# Patient Record
Sex: Male | Born: 1960 | Race: White | Hispanic: No | Marital: Single | Smoking: Current some day smoker
Health system: Southern US, Community
[De-identification: ages and names within clinical notes are randomized; demographics above are authoritative.]

## PROBLEM LIST (undated history)

## (undated) DIAGNOSIS — F419 Anxiety disorder, unspecified: Secondary | ICD-10-CM

## (undated) DIAGNOSIS — M4800 Spinal stenosis, site unspecified: Secondary | ICD-10-CM

## (undated) DIAGNOSIS — M869 Osteomyelitis, unspecified: Secondary | ICD-10-CM

## (undated) DIAGNOSIS — E119 Type 2 diabetes mellitus without complications: Secondary | ICD-10-CM

## (undated) DIAGNOSIS — K589 Irritable bowel syndrome without diarrhea: Secondary | ICD-10-CM

## (undated) DIAGNOSIS — N186 End stage renal disease: Secondary | ICD-10-CM

## (undated) DIAGNOSIS — B1920 Unspecified viral hepatitis C without hepatic coma: Secondary | ICD-10-CM

## (undated) DIAGNOSIS — E785 Hyperlipidemia, unspecified: Secondary | ICD-10-CM

## (undated) DIAGNOSIS — F32A Depression, unspecified: Secondary | ICD-10-CM

## (undated) DIAGNOSIS — F329 Major depressive disorder, single episode, unspecified: Secondary | ICD-10-CM

## (undated) HISTORY — PX: SPINAL FUSION: SHX223

## (undated) HISTORY — PX: BACK SURGERY: SHX140

## (undated) HISTORY — PX: OTHER SURGICAL HISTORY: SHX169

---
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
## 2017-06-18 DIAGNOSIS — E1165 Type 2 diabetes mellitus with hyperglycemia: Secondary | ICD-10-CM

## 2017-06-27 DIAGNOSIS — L97519 Non-pressure chronic ulcer of other part of right foot with unspecified severity: Secondary | ICD-10-CM | POA: Insufficient documentation

## 2017-06-27 DIAGNOSIS — E08621 Diabetes mellitus due to underlying condition with foot ulcer: Secondary | ICD-10-CM | POA: Insufficient documentation

## 2017-06-27 DIAGNOSIS — F17200 Nicotine dependence, unspecified, uncomplicated: Secondary | ICD-10-CM | POA: Insufficient documentation

## 2017-06-27 DIAGNOSIS — L03116 Cellulitis of left lower limb: Secondary | ICD-10-CM | POA: Insufficient documentation

## 2017-06-29 DIAGNOSIS — M86172 Other acute osteomyelitis, left ankle and foot: Secondary | ICD-10-CM | POA: Insufficient documentation

## 2017-08-15 DIAGNOSIS — B9561 Methicillin susceptible Staphylococcus aureus infection as the cause of diseases classified elsewhere: Secondary | ICD-10-CM | POA: Diagnosis present

## 2017-08-15 DIAGNOSIS — R7881 Bacteremia: Secondary | ICD-10-CM | POA: Diagnosis present

## 2017-08-17 DIAGNOSIS — A4102 Sepsis due to Methicillin resistant Staphylococcus aureus: Secondary | ICD-10-CM | POA: Insufficient documentation

## 2018-01-23 DIAGNOSIS — M25562 Pain in left knee: Secondary | ICD-10-CM | POA: Insufficient documentation

## 2018-03-24 DIAGNOSIS — R109 Unspecified abdominal pain: Secondary | ICD-10-CM | POA: Insufficient documentation

## 2018-10-17 DIAGNOSIS — R269 Unspecified abnormalities of gait and mobility: Secondary | ICD-10-CM | POA: Insufficient documentation

## 2018-10-17 DIAGNOSIS — Z89511 Acquired absence of right leg below knee: Secondary | ICD-10-CM | POA: Insufficient documentation

## 2018-11-07 DIAGNOSIS — E785 Hyperlipidemia, unspecified: Secondary | ICD-10-CM | POA: Insufficient documentation

## 2018-11-07 DIAGNOSIS — Z765 Malingerer [conscious simulation]: Secondary | ICD-10-CM | POA: Insufficient documentation

## 2019-01-23 HISTORY — PX: THORACIC SPINE SURGERY: SHX802

## 2019-01-27 ENCOUNTER — Other Ambulatory Visit: Admit: 2019-01-27 | Payer: Medicaid Other

## 2019-01-28 ENCOUNTER — Other Ambulatory Visit (INDEPENDENT_AMBULATORY_CARE_PROVIDER_SITE_OTHER): Payer: Self-pay | Admitting: Nurse Practitioner

## 2019-01-28 ENCOUNTER — Telehealth (INDEPENDENT_AMBULATORY_CARE_PROVIDER_SITE_OTHER): Payer: Self-pay

## 2019-01-28 MED ORDER — CEFAZOLIN SODIUM-DEXTROSE 2-4 GM/100ML-% IV SOLN: 2.0000 g | Freq: Once | INTRAVENOUS | Status: DC

## 2019-01-28 NOTE — Telephone Encounter (Signed)
Patient was scheduled for Thursday 01/29/2019 with a 12:45 pm arrival time for Dr. Wyn Quaker for a Hickman cath placement per Dr. Wyn Quaker and [facility]. Rodney Booze at [employer] was given the instructions regarding the patient.

## 2019-01-30 ENCOUNTER — Ambulatory Visit: Admission: RE | Admit: 2019-01-30 | Payer: Medicaid Other | Source: Ambulatory Visit

## 2019-01-30 ENCOUNTER — Other Ambulatory Visit (INDEPENDENT_AMBULATORY_CARE_PROVIDER_SITE_OTHER): Payer: Self-pay | Admitting: Nurse Practitioner

## 2019-02-02 ENCOUNTER — Ambulatory Visit
Admission: RE | Admit: 2019-02-02 | Discharge: 2019-02-02 | Disposition: A | Discharge status: Skilled Nursing Facility | Payer: Medicaid Other | Attending: Vascular Surgery | Admitting: Vascular Surgery

## 2019-02-02 ENCOUNTER — Encounter: Payer: Self-pay | Admitting: *Deleted

## 2019-02-02 ENCOUNTER — Encounter
Admission: RE | Disposition: A | Discharge status: Skilled Nursing Facility | Payer: Self-pay | Source: Home / Self Care | Attending: Vascular Surgery

## 2019-02-02 ENCOUNTER — Other Ambulatory Visit: Payer: Self-pay

## 2019-02-02 ENCOUNTER — Inpatient Hospital Stay: Admission: RE | Admit: 2019-02-02 | Payer: Medicaid Other | Source: Ambulatory Visit

## 2019-02-02 DIAGNOSIS — Z792 Long term (current) use of antibiotics: Secondary | ICD-10-CM | POA: Diagnosis not present

## 2019-02-02 DIAGNOSIS — E1169 Type 2 diabetes mellitus with other specified complication: Secondary | ICD-10-CM | POA: Diagnosis not present

## 2019-02-02 DIAGNOSIS — Z1159 Encounter for screening for other viral diseases: Secondary | ICD-10-CM | POA: Insufficient documentation

## 2019-02-02 DIAGNOSIS — M869 Osteomyelitis, unspecified: Secondary | ICD-10-CM

## 2019-02-02 DIAGNOSIS — E119 Type 2 diabetes mellitus without complications: Secondary | ICD-10-CM | POA: Diagnosis not present

## 2019-02-02 DIAGNOSIS — F17210 Nicotine dependence, cigarettes, uncomplicated: Secondary | ICD-10-CM | POA: Diagnosis not present

## 2019-02-02 HISTORY — DX: Type 2 diabetes mellitus without complications: E11.9

## 2019-02-02 HISTORY — PX: CENTRAL LINE INSERTION-TUNNELED: CATH118291

## 2019-02-02 HISTORY — DX: Osteomyelitis, unspecified: M86.9

## 2019-02-02 LAB — SARS CORONAVIRUS 2 BY RT PCR (HOSPITAL ORDER, PERFORMED IN ~~LOC~~ HOSPITAL LAB): SARS Coronavirus 2: NEGATIVE

## 2019-02-02 LAB — GLUCOSE, CAPILLARY
Glucose-Capillary: 172 mg/dL — ABNORMAL HIGH (ref 70–99)
Glucose-Capillary: 185 mg/dL — ABNORMAL HIGH (ref 70–99)

## 2019-02-02 SURGERY — CENTRAL LINE INSERTION-TUNNELED
Anesthesia: Moderate Sedation

## 2019-02-02 MED ORDER — HYDROMORPHONE HCL 1 MG/ML IJ SOLN: 1.0000 mg | Freq: Once | INTRAMUSCULAR | Status: DC | PRN

## 2019-02-02 MED ORDER — MIDAZOLAM HCL 2 MG/ML PO SYRP: 8.0000 mg | ORAL_SOLUTION | Freq: Once | ORAL | Status: DC | PRN

## 2019-02-02 MED ORDER — FENTANYL CITRATE (PF) 100 MCG/2ML IJ SOLN
INTRAMUSCULAR | Status: DC | PRN
  Administered 2019-02-02 (×2): 50 ug via INTRAVENOUS

## 2019-02-02 MED ORDER — FENTANYL CITRATE (PF) 100 MCG/2ML IJ SOLN
INTRAMUSCULAR | Status: AC
  Filled 2019-02-02: qty 2

## 2019-02-02 MED ORDER — MIDAZOLAM HCL 2 MG/2ML IJ SOLN
INTRAMUSCULAR | Status: AC
  Filled 2019-02-02: qty 2

## 2019-02-02 MED ORDER — LIDOCAINE-EPINEPHRINE (PF) 1 %-1:200000 IJ SOLN
INTRAMUSCULAR | Status: AC
  Filled 2019-02-02: qty 30

## 2019-02-02 MED ORDER — ONDANSETRON HCL 4 MG/2ML IJ SOLN: 4.0000 mg | Freq: Four times a day (QID) | INTRAMUSCULAR | Status: DC | PRN

## 2019-02-02 MED ORDER — FAMOTIDINE 20 MG PO TABS: 40.0000 mg | ORAL_TABLET | Freq: Once | ORAL | Status: DC | PRN

## 2019-02-02 MED ORDER — SODIUM CHLORIDE 0.9 % IV SOLN
INTRAVENOUS | Status: DC
  Administered 2019-02-02: 12:00:00 via INTRAVENOUS

## 2019-02-02 MED ORDER — MIDAZOLAM HCL 2 MG/2ML IJ SOLN
INTRAMUSCULAR | Status: DC | PRN
  Administered 2019-02-02: 2 mg via INTRAVENOUS

## 2019-02-02 MED ORDER — METHYLPREDNISOLONE SODIUM SUCC 125 MG IJ SOLR: 125.0000 mg | Freq: Once | INTRAMUSCULAR | Status: DC | PRN

## 2019-02-02 MED ORDER — CEFAZOLIN SODIUM-DEXTROSE 2-4 GM/100ML-% IV SOLN
INTRAVENOUS | Status: AC
  Administered 2019-02-02: 2 g
  Filled 2019-02-02: qty 100

## 2019-02-02 MED ORDER — DIPHENHYDRAMINE HCL 50 MG/ML IJ SOLN: 50.0000 mg | Freq: Once | INTRAMUSCULAR | Status: DC | PRN

## 2019-02-02 SURGICAL SUPPLY — 6 items
DERMABOND ADVANCED (GAUZE/BANDAGES/DRESSINGS) ×2
DERMABOND ADVANCED .7 DNX12 (GAUZE/BANDAGES/DRESSINGS) ×1 IMPLANT
KIT SINGLE LUMEN POWERLINE 5FR (CATHETERS) ×3 IMPLANT
PACK ANGIOGRAPHY (CUSTOM PROCEDURE TRAY) ×3 IMPLANT
SUT MON AB 4-0 PC3 18 (SUTURE) ×3 IMPLANT
SUT PROLENE 0 CT 1 30 (SUTURE) ×3 IMPLANT

## 2019-02-02 NOTE — H&P (Signed)
[facility] Admission History & Physical  MRN : [id number]  Eugene [name] is a 58 y.o. (Aug 12, 1961) male who presents with chief complaint of need for Hickman Placement.  History of Present Illness:  The patient is a 58 year old male with a past medical history of diabetes, osteomyelitis, current everyday tobacco abuse who resides in a nursing home.  The patient is a poor historian and information for this admission note was taken from nursing home paperwork and previous EPIC notation.  Patient had a PICC line placed for long-term antibiotic therapy.  Unfortunately, the patient would remove his PICC line and a more permanent access was needed.  Patient denies any fever, nausea vomiting.  Patient denies any chest pain or shortness of breath.    Patient was referred to our practice and presents today to have a Hickman catheter placed so he may resume his long-term antibiotics. Current Facility-Administered Medications  Medication Dose Route Frequency Provider Last Rate Last Dose  . 0.9 %  sodium chloride infusion   Intravenous Continuous Georgiana Spinner, NP 75 mL/hr at 02/02/19 1159    . ceFAZolin (ANCEF) IVPB 2g/100 mL premix  2 g Intravenous Once Sheppard Plumber E, NP      . diphenhydrAMINE (BENADRYL) injection 50 mg  50 mg Intravenous Once PRN Georgiana Spinner, NP      . famotidine (PEPCID) tablet 40 mg  40 mg Oral Once PRN Georgiana Spinner, NP      . fentaNYL (SUBLIMAZE) 100 MCG/2ML injection           . HYDROmorphone (DILAUDID) injection 1 mg  1 mg Intravenous Once PRN Georgiana Spinner, NP      . lidocaine-EPINEPHrine (XYLOCAINE-EPINEPHrine) 1 %-1:200000 (PF) injection           . methylPREDNISolone sodium succinate (SOLU-MEDROL) 125 mg/2 mL injection 125 mg  125 mg Intravenous Once PRN Georgiana Spinner, NP      . midazolam (VERSED) 2 MG/2ML injection           . midazolam (VERSED) 2 MG/ML syrup 8 mg  8 mg Oral Once PRN Georgiana Spinner, NP      . ondansetron [facility])  injection 4 mg  4 mg Intravenous Q6H PRN Georgiana Spinner, NP       Past Medical History:  Diagnosis Date  . Diabetes mellitus without complication (HCC)   . Osteomyelitis [facility])    Past Surgical History:  Procedure Laterality Date  . bilateral amputation Bilateral    Social History Social History   Tobacco Use  . Smoking status: Current Every Day Smoker    Packs/day: 0.25    Types: Cigarettes  . Smokeless tobacco: Never Used  Substance Use Topics  . Alcohol use: Not Currently  . Drug use: Not on file   Family History History reviewed. No pertinent family history.  Unable to obtain as the patient is a poor historian  No Known Allergies  REVIEW OF SYSTEMS (Negative unless checked)  Constitutional: Weight loss  Fever  Chills Cardiac: Chest pain   Chest pressure   Palpitations   Shortness of breath when laying flat   Shortness of breath at rest   Shortness of breath with exertion. Vascular:  Pain in legs with walking   Pain in legs at rest   Pain in legs when laying flat   Claudication   Pain in feet when walking  Pain in feet at rest  Pain in feet when laying flat     History of DVT   [] Phlebitis   [] Swelling in legs   [] Varicose veins   [] Non-healing ulcers Pulmonary:   [] Uses home oxygen   [] Productive cough   [] Hemoptysis   [] Wheeze  [] COPD   [] Asthma Neurologic:  [] Dizziness  [] Blackouts   [] Seizures   [] History of stroke   [] History of TIA  [] Aphasia   [] Temporary blindness   [] Dysphagia   [] Weakness or numbness in arms   [] Weakness or numbness in legs Musculoskeletal:  [] Arthritis   [] Joint swelling   [] Joint pain   [] Low back pain Hematologic:  [] Easy bruising  [] Easy bleeding   [] Hypercoagulable state   [] Anemic  [] Hepatitis Gastrointestinal:  [] Blood in stool   [] Vomiting blood  [] Gastroesophageal reflux/heartburn   [] Difficulty swallowing. Genitourinary:  [] Chronic kidney disease   [] Difficult urination  [] Frequent urination  [] Burning with  urination   [] Blood in urine Skin:  [] Rashes   [] Ulcers   [] Wounds Psychological:  [] History of anxiety   []  History of major depression.  Unable to obtain as the patient is a poor historian  Physical Examination  Vitals:   02/02/19 1339 02/02/19 1345 02/02/19 1350 02/02/19 1400  BP:    (!) 95/54  Pulse:    80  Resp:    12  Temp:      TempSrc:      SpO2: 99% 99% 98% 93%  Weight:      Height:       Body mass index is 31.87 kg/m. Gen: WD/WN, NAD Head: Geiger/AT, No temporalis wasting. Prominent temp pulse not noted. Ear/Nose/Throat: Hearing grossly intact, nares w/o erythema or drainage, oropharynx w/o Erythema/Exudate,  Eyes: Conjunctiva clear, sclera non-icteric Neck: Trachea midline.  No JVD.  Pulmonary:  Good air movement, respirations not labored, no use of accessory muscles.  Cardiac: RRR, normal S1, S2. Gastrointestinal: soft, non-tender/non-distended. No guarding/reflex.  Musculoskeletal: M/S 5/5 throughout.  Extremities without ischemic changes.  Neurologic: Sensation grossly intact in extremities.  Symmetrical.  Speech is fluent. Motor exam as listed above. Psychiatric: Judgment intact, Mood & affect appropriate for pt's clinical situation. Dermatologic: No rashes or ulcers noted.  No cellulitis or open wounds. Lymph : No Cervical, Axillary, or Inguinal lymphadenopathy.  CBC No results found for: WBC, HGB, HCT, MCV, PLT  BMET No results found for: NA, K, CL, CO2, GLUCOSE, BUN, CREATININE, CALCIUM, GFRNONAA, GFRAA CrCl cannot be calculated (No successful lab value found.).  COAG No results found for: INR, PROTIME  Radiology No results found.  Assessment/Plan The patient is a 58 year old male with a past medical history of diabetes, osteomyelitis, current everyday tobacco abuse who resides in a nursing home. 1. Need for long term ABX: The patient had a PICC line placed however has been removing them.  The patient needs to be on long-term antibiotics and he was sent  to our practice to have a Hickman placed in order to be able to receive those antibiotics long-term.  Procedure, risks and benefits explained to the patient.  All questions answered.  The patient wishes to proceed. 2. Tobacco Abuse: We had a discussion for approximately three minutes regarding the absolute need for smoking cessation due to the deleterious nature of tobacco on the vascular system. We discussed the tobacco use would diminish patency of any intervention, and likely significantly worsen progressio of disease. We discussed multiple agents for quitting including replacement therapy or medications to reduce cravings such as Chantix. The patient voices their understanding of the importance of smoking cessation. 3. Diabetes: On appropriate medications.  Encouraged good control as its slows the progression of atherosclerotic disease  Discussed with Dr. Weldon Inches, PA-C  02/02/2019 2:16 PM

## 2019-02-02 NOTE — Op Note (Signed)
OPERATIVE NOTE    PRE-OPERATIVE DIAGNOSIS: 1.  Left foot osteomyelitis  POST-OPERATIVE DIAGNOSIS: same as above  PROCEDURE: 1. Ultrasound guidance for vascular access to the right internal jugular vein 2. Fluoroscopic guidance for placement of catheter 3. Placement of a tunneled Hickman catheter right jugular vein  SURGEON: Festus Barren, MD  ANESTHESIA:  Local with Moderate conscious sedation for approximately 20 minutes using 2 mg of Versed and 50 mcg of Fentanyl  ESTIMATED BLOOD LOSS: 3 cc  FLUORO TIME: less than one minute  CONTRAST: none  FINDING(S): 1.  Patent right internal jugular vein  SPECIMEN(S):  None  INDICATIONS:   Eugene [name] is a 58 y.o.male who presents with chronic wounds and osteomyelitis of the left foot.  He has had multiple other issues including a thoracic laminectomy and GI issues.  He has been on chronic antibiotics but has been pulling at his PICC line's and so we are asked to place a tunneled catheter so that it will be more durable.  Risks and benefits are discussed and informed consent is obtained.     DESCRIPTION: After obtaining full informed written consent, the patient was brought back to the vascular suited. The patient's right neck and chest were sterilely prepped and draped in a sterile surgical field was created. Moderate conscious sedation was administered during a face to face encounter with the patient throughout the procedure with my supervision of the RN administering medicines and monitoring the patient's vital signs, pulse oximetry, telemetry and mental status throughout from the start of the procedure until the patient was taken to the recovery room.  The right internal jugular vein was visualized with ultrasound and found to be patent. It was then accessed under direct ultrasound guidance and a permanent image was recorded. A wire was placed. After skin nick and dilatation, the peel-away sheath was placed over the wire. I then turned my  attention to an area under the clavicle. Approximately 1-2 fingerbreadths below the clavicle a small counterincision was created and tunneled from the subclavicular incision to the access site. Using fluoroscopic guidance, a Hickman catheter was selected, and tunneled from the subclavicular incision to the access site. It was then placed through the peel-away sheath and the peel-away sheath was removed. Using fluoroscopic guidance, it was cut to an appropriate length to park the tip at the cavoatrial junction. It withdrew blood well and flushed easily with heparinized saline. It was secured to the chest wall with 2 large Sixk sutures. The access incision was closed single 4-0 Monocryl. A 4-0 Monocryl pursestring suture was placed around the exit site. Sterile dressings were placed. The patient tolerated the procedure well and was taken to the recovery room in stable condition.  COMPLICATIONS: None  CONDITION: Stable  Festus Barren, MD 02/02/2019 2:26 PM   This note was created with Dragon Medical transcription system. Any errors in dictation are purely unintentional.

## 2019-02-02 NOTE — Progress Notes (Signed)
EMS present to transport Patient back to [facility]. Called SNF twice to update, unable to get [facility] on phone. Report already called post procedure to [facility] LPN at [facility].

## 2019-02-03 ENCOUNTER — Encounter: Payer: Self-pay | Admitting: Vascular Surgery

## 2019-02-06 ENCOUNTER — Other Ambulatory Visit: Payer: Self-pay

## 2019-02-06 ENCOUNTER — Encounter: Payer: Self-pay | Admitting: Emergency Medicine

## 2019-02-06 ENCOUNTER — Inpatient Hospital Stay
Admission: EM | Admit: 2019-02-06 | Discharge: 2019-02-10 | DRG: 683 | Disposition: A | Discharge status: Other Acute Inpatient Hospital | Payer: Medicaid Other | Source: Skilled Nursing Facility | Attending: Internal Medicine | Admitting: Internal Medicine

## 2019-02-06 DIAGNOSIS — E1151 Type 2 diabetes mellitus with diabetic peripheral angiopathy without gangrene: Secondary | ICD-10-CM | POA: Diagnosis present

## 2019-02-06 DIAGNOSIS — G9520 Unspecified cord compression: Secondary | ICD-10-CM | POA: Diagnosis present

## 2019-02-06 DIAGNOSIS — N179 Acute kidney failure, unspecified: Principal | ICD-10-CM | POA: Diagnosis present

## 2019-02-06 DIAGNOSIS — R7881 Bacteremia: Secondary | ICD-10-CM | POA: Diagnosis present

## 2019-02-06 DIAGNOSIS — E1165 Type 2 diabetes mellitus with hyperglycemia: Secondary | ICD-10-CM

## 2019-02-06 DIAGNOSIS — Z20828 Contact with and (suspected) exposure to other viral communicable diseases: Secondary | ICD-10-CM | POA: Diagnosis present

## 2019-02-06 DIAGNOSIS — E11628 Type 2 diabetes mellitus with other skin complications: Secondary | ICD-10-CM | POA: Diagnosis present

## 2019-02-06 DIAGNOSIS — L89152 Pressure ulcer of sacral region, stage 2: Secondary | ICD-10-CM | POA: Diagnosis present

## 2019-02-06 DIAGNOSIS — L8990 Pressure ulcer of unspecified site, unspecified stage: Secondary | ICD-10-CM | POA: Diagnosis present

## 2019-02-06 DIAGNOSIS — N390 Urinary tract infection, site not specified: Secondary | ICD-10-CM | POA: Diagnosis present

## 2019-02-06 DIAGNOSIS — B9561 Methicillin susceptible Staphylococcus aureus infection as the cause of diseases classified elsewhere: Secondary | ICD-10-CM | POA: Diagnosis present

## 2019-02-06 DIAGNOSIS — Z79899 Other long term (current) drug therapy: Secondary | ICD-10-CM

## 2019-02-06 DIAGNOSIS — I739 Peripheral vascular disease, unspecified: Secondary | ICD-10-CM | POA: Diagnosis present

## 2019-02-06 DIAGNOSIS — E1169 Type 2 diabetes mellitus with other specified complication: Secondary | ICD-10-CM | POA: Diagnosis present

## 2019-02-06 DIAGNOSIS — E11319 Type 2 diabetes mellitus with unspecified diabetic retinopathy without macular edema: Secondary | ICD-10-CM | POA: Diagnosis present

## 2019-02-06 DIAGNOSIS — Z794 Long term (current) use of insulin: Secondary | ICD-10-CM

## 2019-02-06 DIAGNOSIS — I10 Essential (primary) hypertension: Secondary | ICD-10-CM | POA: Diagnosis present

## 2019-02-06 DIAGNOSIS — Z89612 Acquired absence of left leg above knee: Secondary | ICD-10-CM

## 2019-02-06 DIAGNOSIS — E875 Hyperkalemia: Secondary | ICD-10-CM | POA: Diagnosis present

## 2019-02-06 DIAGNOSIS — D649 Anemia, unspecified: Secondary | ICD-10-CM | POA: Diagnosis present

## 2019-02-06 DIAGNOSIS — F17210 Nicotine dependence, cigarettes, uncomplicated: Secondary | ICD-10-CM | POA: Diagnosis present

## 2019-02-06 DIAGNOSIS — G894 Chronic pain syndrome: Secondary | ICD-10-CM | POA: Diagnosis present

## 2019-02-06 DIAGNOSIS — M4624 Osteomyelitis of vertebra, thoracic region: Secondary | ICD-10-CM | POA: Diagnosis present

## 2019-02-06 DIAGNOSIS — Z791 Long term (current) use of non-steroidal anti-inflammatories (NSAID): Secondary | ICD-10-CM

## 2019-02-06 DIAGNOSIS — Z789 Other specified health status: Secondary | ICD-10-CM

## 2019-02-06 DIAGNOSIS — F1910 Other psychoactive substance abuse, uncomplicated: Secondary | ICD-10-CM | POA: Diagnosis present

## 2019-02-06 DIAGNOSIS — E119 Type 2 diabetes mellitus without complications: Secondary | ICD-10-CM

## 2019-02-06 DIAGNOSIS — E876 Hypokalemia: Secondary | ICD-10-CM | POA: Diagnosis present

## 2019-02-06 DIAGNOSIS — Z792 Long term (current) use of antibiotics: Secondary | ICD-10-CM

## 2019-02-06 DIAGNOSIS — B9562 Methicillin resistant Staphylococcus aureus infection as the cause of diseases classified elsewhere: Secondary | ICD-10-CM | POA: Diagnosis present

## 2019-02-06 DIAGNOSIS — E785 Hyperlipidemia, unspecified: Secondary | ICD-10-CM | POA: Diagnosis present

## 2019-02-06 DIAGNOSIS — Z79891 Long term (current) use of opiate analgesic: Secondary | ICD-10-CM

## 2019-02-06 DIAGNOSIS — B182 Chronic viral hepatitis C: Secondary | ICD-10-CM | POA: Diagnosis present

## 2019-02-06 DIAGNOSIS — K219 Gastro-esophageal reflux disease without esophagitis: Secondary | ICD-10-CM | POA: Diagnosis present

## 2019-02-06 DIAGNOSIS — Z89511 Acquired absence of right leg below knee: Secondary | ICD-10-CM

## 2019-02-06 LAB — CBC WITH DIFFERENTIAL/PLATELET
Abs Immature Granulocytes: 0.03 10*3/uL (ref 0.00–0.07)
Basophils Absolute: 0 10*3/uL (ref 0.0–0.1)
Basophils Relative: 1 %
Eosinophils Absolute: 0.3 10*3/uL (ref 0.0–0.5)
Eosinophils Relative: 4 %
HCT: 27.5 % — ABNORMAL LOW (ref 39.0–52.0)
Hemoglobin: 8.4 g/dL — ABNORMAL LOW (ref 13.0–17.0)
Immature Granulocytes: 0 %
Lymphocytes Relative: 19 %
Lymphs Abs: 1.4 10*3/uL (ref 0.7–4.0)
MCH: 28.4 pg (ref 26.0–34.0)
MCHC: 30.5 g/dL (ref 30.0–36.0)
MCV: 92.9 fL (ref 80.0–100.0)
Monocytes Absolute: 0.6 10*3/uL (ref 0.1–1.0)
Monocytes Relative: 8 %
Neutro Abs: 5.2 10*3/uL (ref 1.7–7.7)
Neutrophils Relative %: 68 %
Platelets: 279 10*3/uL (ref 150–400)
RBC: 2.96 MIL/uL — ABNORMAL LOW (ref 4.22–5.81)
RDW: 14.5 % (ref 11.5–15.5)
WBC: 7.5 10*3/uL (ref 4.0–10.5)
nRBC: 0 % (ref 0.0–0.2)

## 2019-02-06 MED ORDER — SODIUM CHLORIDE 0.9 % IV SOLN
2.0000 g | Freq: Once | INTRAVENOUS | Status: AC
  Administered 2019-02-07: 2 g via INTRAVENOUS
  Filled 2019-02-06: qty 2

## 2019-02-06 MED ORDER — METRONIDAZOLE IN NACL 5-0.79 MG/ML-% IV SOLN
500.0000 mg | Freq: Once | INTRAVENOUS | Status: AC
  Administered 2019-02-07: 500 mg via INTRAVENOUS
  Filled 2019-02-06: qty 100

## 2019-02-06 MED ORDER — VANCOMYCIN HCL IN DEXTROSE 1-5 GM/200ML-% IV SOLN: 1000.0000 mg | Freq: Once | INTRAVENOUS | Status: DC

## 2019-02-06 MED ORDER — VANCOMYCIN HCL 10 G IV SOLR: 2000.0000 mg | Freq: Once | INTRAVENOUS | Status: DC

## 2019-02-06 NOTE — ED Triage Notes (Signed)
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  Hx of DM

## 2019-02-06 NOTE — ED Provider Notes (Signed)
[facility] Emergency Department Provider Note   First MD Initiated Contact with Patient 02/06/19 2319     (approximate)  I have reviewed the triage vital signs and the nursing notes.   HISTORY  Chief Complaint Acute Renal Failure and hyperkalemia    HPI Eugene [name] is a 58 y.o. 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  Patient denies any complaints.        Past Medical History:  Diagnosis Date   Diabetes mellitus without complication (HCC)    Osteomyelitis [facility](HCC)     Patient Active Problem List   Diagnosis Date Noted   UTI (urinary tract infection) 02/07/2019   AKI (acute kidney injury) (HCC) 02/07/2019   Hyperkalemia 02/07/2019   Diabetes (HCC) 02/07/2019    Past Surgical History:  Procedure Laterality Date   bilateral amputation Bilateral    CENTRAL LINE INSERTION-TUNNELED N/A 02/02/2019   Procedure: CENTRAL LINE INSERTION-TUNNELED;  Surgeon: Annice Needyew, Jason S, MD;  Location: [facility] INVASIVE CV LAB;  Service: Cardiovascular;  Laterality: N/A;    Prior to Admission medications   Medication Sig Start Date End Date Taking? Authorizing Provider  acetaminophen (TYLENOL) 325 MG tablet Take 650 mg by mouth every 4 (four) hours as needed for mild pain or fever.   Yes [provider]  Amino Acids (AMINO ACID PO) Take 30 mLs by mouth.   Yes [provider]  amLODipine (NORVASC) 10 MG tablet Take 10 mg by mouth daily.   Yes [provider]  bisacodyl (DULCOLAX) 10 MG suppository Place 10 mg rectally daily.    Yes [provider]  bisacodyl (DULCOLAX) 5 MG EC tablet Take 10 mg by mouth daily.   Yes [provider]  diphenhydrAMINE  (BENADRYL) 50 MG capsule Take 50 mg by mouth every 6 (six) hours as needed for itching.   Yes [provider]  docusate sodium (COLACE) 100 MG capsule Take 100 mg by mouth 2 (two) times daily.   Yes [provider]  famotidine (PEPCID) 20 MG tablet Take 20 mg by mouth 2 (two) times daily.   Yes [provider]  ferrous gluconate (FERGON) 324 MG tablet Take 324 mg by mouth daily with breakfast.   Yes [provider]  furosemide (LASIX) 40 MG tablet Take 40 mg by mouth 2 (two) times daily.    Yes [provider]  heparin 5000 UNIT/ML injection Inject 5,000 Units into the skin every 12 (twelve) hours.    Yes [provider]  heparin flush 10 UNIT/ML SOLN injection Inject 50 Units into the vein 2 (two) times daily. Also flush with 10ml of normal saline   Yes [provider]  hydrALAZINE (APRESOLINE) 50 MG tablet Take 50 mg by mouth 3 (three) times daily.   Yes [provider]  ibuprofen (ADVIL) 800 MG tablet Take 800 mg by mouth 3 (three) times daily.    Yes [provider]  insulin glargine (LANTUS) 100 UNIT/ML injection Inject 20 Units into the skin at bedtime.    Yes [provider]  insulin lispro (HUMALOG) 100 UNIT/ML injection Inject 0-15 Units into the skin 4 (four) times daily -  before meals and at bedtime. <150= 0 units 151-200= 3 units 201-250= 6 units 251-300= 9  units 301-350= 12 units 351-400= 15 units >400 call MD   Yes [provider]  Lactulose 20 GM/30ML SOLN Take 30 g by mouth 2 (two) times daily.    Yes [provider]  linaclotide (LINZESS) 290 MCG CAPS capsule Take 290 mcg by mouth daily before breakfast.   Yes [provider]  lisinopril (ZESTRIL) 20 MG tablet Take 20 mg by mouth daily.   Yes [provider]  methadone (DOLOPHINE) 10 MG tablet Take 10 mg by mouth 2 (two) times daily.    Yes [provider]  methocarbamol (ROBAXIN) 500 MG tablet  Take 500 mg by mouth 4 (four) times daily.   Yes [provider]  metoprolol succinate (TOPROL-XL) 25 MG 24 hr tablet Take 25 mg by mouth daily.   Yes [provider]  oxycodone (OXY-IR) 5 MG capsule Take 5 mg by mouth every 4 (four) hours as needed for pain.    Yes [provider]  polyethylene glycol (MIRALAX / GLYCOLAX) 17 g packet Take 17 g by mouth daily.   Yes [provider]  potassium chloride SA (K-DUR) 20 MEQ tablet Take 20 mEq by mouth daily.    Yes [provider]  pravastatin (PRAVACHOL) 40 MG tablet Take 40 mg by mouth daily.   Yes [provider]  pregabalin (LYRICA) 75 MG capsule Take 75 mg by mouth 2 (two) times daily.   Yes [provider]  sodium chloride 0.9 % injection Inject 10 mLs into the vein 2 (two) times daily. Also flush with 5ml of Heparin 10units/ ml   Yes [provider]  sodium phosphate Pediatric (FLEET) 3.5-9.5 GM/59ML enema Place 1 enema rectally every other day as needed for severe constipation.   Yes [provider]  traZODone (DESYREL) 150 MG tablet Take 150 mg by mouth at bedtime.   Yes [provider]  vancomycin IVPB Inject 1,250 mg into the vein every 18 (eighteen) hours.    Yes [provider]    Allergies Patient has no known allergies.  History reviewed. No pertinent family history.  Social History Social History   Tobacco Use   Smoking status: Current Every Day Smoker    Packs/day: 0.25    Types: Cigarettes   Smokeless tobacco: Never Used  Substance Use Topics   Alcohol use: Not Currently   Drug use: Not on file    Review of Systems Constitutional: No fever/chills Eyes: No visual changes. ENT: No sore throat. Cardiovascular: Denies chest pain. Respiratory: Denies shortness of breath. Gastrointestinal: No abdominal pain.  No nausea, no vomiting.  No diarrhea.  No constipation. Genitourinary: Negative for dysuria. Musculoskeletal:  Negative for neck pain.  Negative for back pain. Integumentary: Negative for rash. Neurological: Negative for headaches, focal weakness or numbness.   ____________________________________________   PHYSICAL EXAM:  VITAL SIGNS: ED Triage Vitals  Enc Vitals Group     BP 02/06/19 2325 (!) 99/59     Pulse Rate 02/06/19 2325 84     Resp 02/06/19 2325 13     Temp 02/06/19 2325 98.2 F (36.8 C)     Temp Source 02/06/19 2325 Oral     SpO2 02/06/19 2325 100 %     Weight 02/06/19 2326 106 kg (233 lb 11 oz)     Height --      Head Circumference --      Peak Flow --      Pain Score 02/06/19 2326 0     Pain Loc --  Pain Edu? --      Excl. in GC? --     Constitutional: Alert and oriented. Well appearing and in no acute distress. Eyes: Conjunctivae are normal. PERRL. EOMI. Head: Atraumatic. Mouth/Throat: Mucous membranes are moist.  Oropharynx non-erythematous. Neck: No stridor.   Cardiovascular: Normal rate, regular rhythm. Good peripheral circulation. Grossly normal heart sounds. Respiratory: Normal respiratory effort.  No retractions. No audible wheezing. Gastrointestinal: Soft and nontender. No distention.  Musculoskeletal: No lower extremity tenderness nor edema. No gross deformities of extremities. Neurologic:  Normal speech and language. No gross focal neurologic deficits are appreciated.  Skin:  Skin is warm, dry and intact. No rash noted. Psychiatric: Mood and affect are normal. Speech and behavior are normal.  ____________________________________________   LABS (all labs ordered are listed, but only abnormal results are displayed)  Labs Reviewed  COMPREHENSIVE METABOLIC PANEL - Abnormal; Notable for the following components:      Result Value   Potassium 7.5 (*)    Chloride 114 (*)    CO2 14 (*)    Glucose, Bld 149 (*)    BUN 93 (*)    Creatinine, Ser 2.93 (*)    Albumin 2.6 (*)    AST 14 (*)    GFR calc non Af Amer 23 (*)    GFR calc Af Amer 27 (*)    All  other components within normal limits  CBC WITH DIFFERENTIAL/PLATELET - Abnormal; Notable for the following components:   RBC 2.96 (*)    Hemoglobin 8.4 (*)    HCT 27.5 (*)    All other components within normal limits  URINALYSIS, ROUTINE W REFLEX MICROSCOPIC - Abnormal; Notable for the following components:   Color, Urine AMBER (*)    APPearance TURBID (*)    Hgb urine dipstick SMALL (*)    Protein, ur 100 (*)    Leukocytes,Ua LARGE (*)    RBC / HPF >50 (*)    WBC, UA >50 (*)    All other components within normal limits  SARS CORONAVIRUS 2 (HOSPITAL ORDER, PERFORMED IN [facility])  CULTURE, BLOOD (ROUTINE X 2)  CULTURE, BLOOD (ROUTINE X 2)  URINE CULTURE  MRSA PCR SCREENING  LACTIC ACID, PLASMA  VANCOMYCIN, RANDOM  POTASSIUM  HIV ANTIBODY (ROUTINE TESTING W REFLEX)  BASIC METABOLIC PANEL  CBC   ____________________________________________  EKG  ED ECG REPORT I, Tur[place] N Chad Tiznado, the attending physician, personally viewed and interpreted this ECG.   Date: 02/06/2019  EKG Time: 11:15 PM  Rate: 86  Rhythm: Normal sinus rhythm  Axis: Normal  Intervals: Normal  ST&T Change: None   .Critical Care Performed by: Darci Current, MD Authorized by: Darci Current, MD   Critical care provider statement:    Critical care time (minutes):  30   Critical care time was exclusive of:  Separately billable procedures and treating other patients (Hyperkalemia)   Critical care was necessary to treat or prevent imminent or life-threatening deterioration of the following conditions:  Sepsis   Critical care was time spent personally by me on the following activities:  Development of treatment plan with patient or surrogate, discussions with consultants, evaluation of patient's response to treatment, examination of patient, obtaining history from patient or surrogate, ordering and performing treatments and interventions, ordering and review of laboratory studies,  ordering and review of radiographic studies, pulse oximetry, re-evaluation of patient's condition and review of old charts   I assumed direction of critical care for this patient from  another provider in my specialty: no       ____________________________________________   INITIAL IMPRESSION / MDM / ASSESSMENT AND PLAN / ED COURSE  As part of my medical decision making, I reviewed the following data within the electronic MEDICAL RECORD NUMBER      *Szymon Infante was evaluated in Emergency Department on 02/07/2019 for the symptoms described in the history of present illness. He was evaluated in the context of the global COVID-19 pandemic, which necessitated consideration that the patient might be at risk for infection with the SARS-CoV-2 virus that causes COVID-19. Institutional protocols and algorithms that pertain to the evaluation of patients at risk for COVID-19 are in a state of rapid change based on information released by regulatory bodies including the CDC and federal and state organizations. These policies and algorithms were followed during the patient's care in the ED.*   58 year old male presenting with above-stated history and physical exam secondary to abnormal labs which were confirmed here in the emergency department patient's creatinine 2.93 with a BUN of 93 patient's potassium 7.5.  Patient was given calcium chloride, insulin 6 units IV D50 1 amp sodium bicarb 1 amp and [place].  In addition patient's urine concerning for urinary tract infection as well.  Patient discussed with Dr. Anne Hahn for hospital admission further evaluation and management.  ____________________________________________  FINAL CLINICAL IMPRESSION(S) / ED DIAGNOSES  Final diagnoses:  Acute renal failure, unspecified acute renal failure type (HCC)  Hyperkalemia     MEDICATIONS GIVEN DURING THIS VISIT:  Medications  heparin injection 5,000 Units (has no administration in time range)  acetaminophen  (TYLENOL) tablet 650 mg (has no administration in time range)    Or  acetaminophen (TYLENOL) suppository 650 mg (has no administration in time range)  ondansetron (ZOFRAN) tablet 4 mg (has no administration in time range)    Or  ondansetron (ZOFRAN) injection 4 mg (has no administration in time range)  insulin aspart (novoLOG) injection 0-9 Units (has no administration in time range)  insulin aspart (novoLOG) injection 0-5 Units (has no administration in time range)  ceFEPIme (MAXIPIME) 2 g in sodium chloride 0.9 % 100 mL IVPB (has no administration in time range)  ceFEPIme (MAXIPIME) 2 g in sodium chloride 0.9 % 100 mL IVPB (0 g Intravenous Stopped 02/07/19 0008)  metroNIDAZOLE (FLAGYL) IVPB 500 mg ( Intravenous Stopped 02/07/19 0204)  sodium bicarbonate injection 100 mEq (100 mEq Intravenous Given 02/07/19 0040)  insulin aspart (novoLOG) injection 6 Units (6 Units Intravenous Given 02/07/19 0036)  dextrose 50 % solution 25 g (25 g Intravenous Given 02/07/19 0034)  sodium chloride 0.9 % bolus 1,000 mL (0 mLs Intravenous Stopped 02/07/19 0207)  patiromer Lelon Perla) packet 8.4 g (8.4 g Oral Given 02/07/19 0049)  calcium chloride injection 1 g (1 g Intravenous Given 02/07/19 0048)     ED Discharge Orders    None       Note:  This document was prepared using Dragon voice recognition software and may include unintentional dictation errors.   Darci Current, MD 02/07/19 [phone]

## 2019-02-07 ENCOUNTER — Other Ambulatory Visit: Payer: Self-pay

## 2019-02-07 DIAGNOSIS — E1169 Type 2 diabetes mellitus with other specified complication: Secondary | ICD-10-CM | POA: Diagnosis present

## 2019-02-07 DIAGNOSIS — E1151 Type 2 diabetes mellitus with diabetic peripheral angiopathy without gangrene: Secondary | ICD-10-CM | POA: Diagnosis present

## 2019-02-07 DIAGNOSIS — D649 Anemia, unspecified: Secondary | ICD-10-CM | POA: Diagnosis present

## 2019-02-07 DIAGNOSIS — N179 Acute kidney failure, unspecified: Secondary | ICD-10-CM | POA: Diagnosis present

## 2019-02-07 DIAGNOSIS — M4624 Osteomyelitis of vertebra, thoracic region: Secondary | ICD-10-CM | POA: Diagnosis present

## 2019-02-07 DIAGNOSIS — R7881 Bacteremia: Secondary | ICD-10-CM | POA: Diagnosis present

## 2019-02-07 DIAGNOSIS — E876 Hypokalemia: Secondary | ICD-10-CM | POA: Diagnosis present

## 2019-02-07 DIAGNOSIS — Z89511 Acquired absence of right leg below knee: Secondary | ICD-10-CM | POA: Diagnosis not present

## 2019-02-07 DIAGNOSIS — Z79899 Other long term (current) drug therapy: Secondary | ICD-10-CM | POA: Diagnosis not present

## 2019-02-07 DIAGNOSIS — Z20828 Contact with and (suspected) exposure to other viral communicable diseases: Secondary | ICD-10-CM | POA: Diagnosis present

## 2019-02-07 DIAGNOSIS — E11628 Type 2 diabetes mellitus with other skin complications: Secondary | ICD-10-CM | POA: Diagnosis present

## 2019-02-07 DIAGNOSIS — N390 Urinary tract infection, site not specified: Secondary | ICD-10-CM | POA: Diagnosis present

## 2019-02-07 DIAGNOSIS — I10 Essential (primary) hypertension: Secondary | ICD-10-CM | POA: Diagnosis present

## 2019-02-07 DIAGNOSIS — E11319 Type 2 diabetes mellitus with unspecified diabetic retinopathy without macular edema: Secondary | ICD-10-CM | POA: Diagnosis present

## 2019-02-07 DIAGNOSIS — Z792 Long term (current) use of antibiotics: Secondary | ICD-10-CM | POA: Diagnosis not present

## 2019-02-07 DIAGNOSIS — E119 Type 2 diabetes mellitus without complications: Secondary | ICD-10-CM

## 2019-02-07 DIAGNOSIS — Z791 Long term (current) use of non-steroidal anti-inflammatories (NSAID): Secondary | ICD-10-CM | POA: Diagnosis not present

## 2019-02-07 DIAGNOSIS — B9562 Methicillin resistant Staphylococcus aureus infection as the cause of diseases classified elsewhere: Secondary | ICD-10-CM | POA: Diagnosis present

## 2019-02-07 DIAGNOSIS — L8990 Pressure ulcer of unspecified site, unspecified stage: Secondary | ICD-10-CM | POA: Diagnosis present

## 2019-02-07 DIAGNOSIS — E875 Hyperkalemia: Secondary | ICD-10-CM | POA: Diagnosis present

## 2019-02-07 DIAGNOSIS — Z794 Long term (current) use of insulin: Secondary | ICD-10-CM | POA: Diagnosis not present

## 2019-02-07 DIAGNOSIS — G894 Chronic pain syndrome: Secondary | ICD-10-CM | POA: Diagnosis present

## 2019-02-07 DIAGNOSIS — B182 Chronic viral hepatitis C: Secondary | ICD-10-CM | POA: Diagnosis present

## 2019-02-07 DIAGNOSIS — F17210 Nicotine dependence, cigarettes, uncomplicated: Secondary | ICD-10-CM | POA: Diagnosis present

## 2019-02-07 DIAGNOSIS — G9520 Unspecified cord compression: Secondary | ICD-10-CM | POA: Diagnosis present

## 2019-02-07 DIAGNOSIS — Z79891 Long term (current) use of opiate analgesic: Secondary | ICD-10-CM | POA: Diagnosis not present

## 2019-02-07 DIAGNOSIS — E785 Hyperlipidemia, unspecified: Secondary | ICD-10-CM | POA: Diagnosis present

## 2019-02-07 LAB — GLUCOSE, CAPILLARY
Glucose-Capillary: 204 mg/dL — ABNORMAL HIGH (ref 70–99)
Glucose-Capillary: 134 mg/dL — ABNORMAL HIGH (ref 70–99)
Glucose-Capillary: 173 mg/dL — ABNORMAL HIGH (ref 70–99)
Glucose-Capillary: 237 mg/dL — ABNORMAL HIGH (ref 70–99)
Glucose-Capillary: 186 mg/dL — ABNORMAL HIGH (ref 70–99)
Glucose-Capillary: 229 mg/dL — ABNORMAL HIGH (ref 70–99)
Glucose-Capillary: 160 mg/dL — ABNORMAL HIGH (ref 70–99)

## 2019-02-07 LAB — BASIC METABOLIC PANEL
Anion gap: 6 (ref 5–15)
BUN: 86 mg/dL — ABNORMAL HIGH (ref 6–20)
CO2: 15 mmol/L — ABNORMAL LOW (ref 22–32)
Calcium: 9.3 mg/dL (ref 8.9–10.3)
Chloride: 116 mmol/L — ABNORMAL HIGH (ref 98–111)
Creatinine, Ser: 2.54 mg/dL — ABNORMAL HIGH (ref 0.61–1.24)
GFR calc Af Amer: 32 mL/min — ABNORMAL LOW (ref >60)
GFR calc non Af Amer: 27 mL/min — ABNORMAL LOW (ref >60)
Glucose, Bld: 169 mg/dL — ABNORMAL HIGH (ref 70–99)
Potassium: 6.7 mmol/L (ref 3.5–5.1)
Sodium: 137 mmol/L (ref 135–145)

## 2019-02-07 LAB — URINALYSIS, ROUTINE W REFLEX MICROSCOPIC
Bacteria, UA: NONE SEEN
Bilirubin Urine: NEGATIVE
Glucose, UA: NEGATIVE mg/dL
Ketones, ur: NEGATIVE mg/dL
Nitrite: NEGATIVE
Protein, ur: 100 mg/dL — AB
RBC / HPF: >50 RBC/hpf — ABNORMAL HIGH (ref 0–5)
Specific Gravity, Urine: 1.02 (ref 1.005–1.030)
Squamous Epithelial / HPF: NONE SEEN (ref 0–5)
WBC, UA: >50 WBC/hpf — ABNORMAL HIGH (ref 0–5)
pH: 5 (ref 5.0–8.0)

## 2019-02-07 LAB — COMPREHENSIVE METABOLIC PANEL
ALT: 16 U/L (ref 0–44)
AST: 14 U/L — ABNORMAL LOW (ref 15–41)
Albumin: 2.6 g/dL — ABNORMAL LOW (ref 3.5–5.0)
Alkaline Phosphatase: 56 U/L (ref 38–126)
Anion gap: 9 (ref 5–15)
BUN: 93 mg/dL — ABNORMAL HIGH (ref 6–20)
CO2: 14 mmol/L — ABNORMAL LOW (ref 22–32)
Calcium: 9.1 mg/dL (ref 8.9–10.3)
Chloride: 114 mmol/L — ABNORMAL HIGH (ref 98–111)
Creatinine, Ser: 2.93 mg/dL — ABNORMAL HIGH (ref 0.61–1.24)
GFR calc Af Amer: 27 mL/min — ABNORMAL LOW (ref >60)
GFR calc non Af Amer: 23 mL/min — ABNORMAL LOW (ref >60)
Glucose, Bld: 149 mg/dL — ABNORMAL HIGH (ref 70–99)
Potassium: 7.5 mmol/L (ref 3.5–5.1)
Sodium: 137 mmol/L (ref 135–145)
Total Bilirubin: 0.4 mg/dL (ref 0.3–1.2)
Total Protein: 7.8 g/dL (ref 6.5–8.1)

## 2019-02-07 LAB — CBC
HCT: 25.7 % — ABNORMAL LOW (ref 39.0–52.0)
Hemoglobin: 8.1 g/dL — ABNORMAL LOW (ref 13.0–17.0)
MCH: 29.6 pg (ref 26.0–34.0)
MCHC: 31.5 g/dL (ref 30.0–36.0)
MCV: 93.8 fL (ref 80.0–100.0)
Platelets: 255 10*3/uL (ref 150–400)
RBC: 2.74 MIL/uL — ABNORMAL LOW (ref 4.22–5.81)
RDW: 14.5 % (ref 11.5–15.5)
WBC: 7.5 10*3/uL (ref 4.0–10.5)
nRBC: 0 % (ref 0.0–0.2)

## 2019-02-07 LAB — PROTEIN / CREATININE RATIO, URINE
Creatinine, Urine: 66 mg/dL
Protein Creatinine Ratio: 1.73 mg/mg{Cre} — ABNORMAL HIGH (ref 0.00–0.15)
Total Protein, Urine: 114 mg/dL

## 2019-02-07 LAB — VANCOMYCIN, RANDOM
Vancomycin Rm: 35
Vancomycin Rm: 22

## 2019-02-07 LAB — POTASSIUM
Potassium: 6.7 mmol/L (ref 3.5–5.1)
Potassium: 6.5 mmol/L (ref 3.5–5.1)

## 2019-02-07 LAB — LACTIC ACID, PLASMA: Lactic Acid, Venous: 0.6 mmol/L (ref 0.5–1.9)

## 2019-02-07 LAB — SARS CORONAVIRUS 2 BY RT PCR (HOSPITAL ORDER, PERFORMED IN ~~LOC~~ HOSPITAL LAB): SARS Coronavirus 2: NEGATIVE

## 2019-02-07 LAB — MRSA PCR SCREENING: MRSA by PCR: NEGATIVE

## 2019-02-07 MED ORDER — INSULIN ASPART 100 UNIT/ML ~~LOC~~ SOLN
0.0000 [IU] | Freq: Every day | SUBCUTANEOUS | Status: DC
  Administered 2019-02-09: 2 [IU] via SUBCUTANEOUS
  Filled 2019-02-07: qty 1

## 2019-02-07 MED ORDER — ACETAMINOPHEN 325 MG PO TABS
650.0000 mg | ORAL_TABLET | Freq: Four times a day (QID) | ORAL | Status: DC | PRN
  Administered 2019-02-10: 02:00:00 650 mg via ORAL
  Filled 2019-02-07: qty 2

## 2019-02-07 MED ORDER — PATIROMER SORBITEX CALCIUM 8.4 G PO PACK
8.4000 g | PACK | Freq: Once | ORAL | Status: AC
  Administered 2019-02-07: 8.4 g via ORAL
  Filled 2019-02-07: qty 1

## 2019-02-07 MED ORDER — DEXTROSE 50 % IV SOLN
1.0000 | Freq: Once | INTRAVENOUS | Status: AC
  Administered 2019-02-07: 13:00:00 50 mL via INTRAVENOUS
  Filled 2019-02-07: qty 50

## 2019-02-07 MED ORDER — TRAZODONE HCL 50 MG PO TABS
150.0000 mg | ORAL_TABLET | Freq: Every day | ORAL | Status: DC
  Administered 2019-02-07 – 2019-02-09 (×3): 150 mg via ORAL
  Filled 2019-02-07 (×3): qty 3

## 2019-02-07 MED ORDER — OXYCODONE HCL 5 MG PO TABS
5.0000 mg | ORAL_TABLET | ORAL | Status: DC | PRN
  Administered 2019-02-08 – 2019-02-10 (×6): 5 mg via ORAL
  Filled 2019-02-07 (×6): qty 1

## 2019-02-07 MED ORDER — SODIUM POLYSTYRENE SULFONATE 15 GM/60ML PO SUSP: 30.0000 g | Freq: Once | ORAL | Status: DC

## 2019-02-07 MED ORDER — INSULIN ASPART 100 UNIT/ML ~~LOC~~ SOLN
6.0000 [IU] | Freq: Once | SUBCUTANEOUS | Status: AC
  Administered 2019-02-07: 6 [IU] via INTRAVENOUS
  Filled 2019-02-07: qty 1

## 2019-02-07 MED ORDER — CALCIUM CHLORIDE 10 % IV SOLN: 1.0000 g | Freq: Once | INTRAVENOUS | Status: DC

## 2019-02-07 MED ORDER — CALCIUM GLUCONATE-NACL 1-0.675 GM/50ML-% IV SOLN
1.0000 g | Freq: Once | INTRAVENOUS | Status: DC
  Filled 2019-02-07: qty 50

## 2019-02-07 MED ORDER — HEPARIN SODIUM (PORCINE) 5000 UNIT/ML IJ SOLN
5000.0000 [IU] | Freq: Three times a day (TID) | INTRAMUSCULAR | Status: DC
  Administered 2019-02-07 – 2019-02-10 (×10): 5000 [IU] via SUBCUTANEOUS
  Filled 2019-02-07 (×10): qty 1

## 2019-02-07 MED ORDER — PATIROMER SORBITEX CALCIUM 8.4 G PO PACK
16.8000 g | PACK | Freq: Every day | ORAL | Status: DC
  Administered 2019-02-07: 16.8 g via ORAL
  Filled 2019-02-07 (×2): qty 2

## 2019-02-07 MED ORDER — INSULIN GLARGINE 100 UNIT/ML ~~LOC~~ SOLN
20.0000 [IU] | Freq: Every day | SUBCUTANEOUS | Status: DC
  Administered 2019-02-07 – 2019-02-09 (×3): 20 [IU] via SUBCUTANEOUS
  Filled 2019-02-07 (×4): qty 0.2

## 2019-02-07 MED ORDER — INSULIN REGULAR BOLUS VIA INFUSION
10.0000 [IU] | Freq: Once | INTRAVENOUS | Status: DC
  Filled 2019-02-07: qty 10

## 2019-02-07 MED ORDER — INSULIN ASPART 100 UNIT/ML ~~LOC~~ SOLN
0.0000 [IU] | Freq: Three times a day (TID) | SUBCUTANEOUS | Status: DC
  Administered 2019-02-07: 17:00:00 3 [IU] via SUBCUTANEOUS
  Administered 2019-02-07: 08:00:00 1 [IU] via SUBCUTANEOUS
  Administered 2019-02-08: 18:00:00 2 [IU] via SUBCUTANEOUS
  Administered 2019-02-08 (×2): 1 [IU] via SUBCUTANEOUS
  Administered 2019-02-09 – 2019-02-10 (×4): 2 [IU] via SUBCUTANEOUS
  Administered 2019-02-10: 09:00:00 1 [IU] via SUBCUTANEOUS
  Filled 2019-02-07 (×10): qty 1

## 2019-02-07 MED ORDER — PRAVASTATIN SODIUM 20 MG PO TABS
40.0000 mg | ORAL_TABLET | Freq: Every day | ORAL | Status: DC
  Administered 2019-02-07 – 2019-02-10 (×4): 40 mg via ORAL
  Filled 2019-02-07 (×4): qty 2

## 2019-02-07 MED ORDER — CALCIUM CHLORIDE 10 % IV SOLN
1.0000 g | Freq: Once | INTRAVENOUS | Status: AC
  Administered 2019-02-07: 01:00:00 1 g via INTRAVENOUS

## 2019-02-07 MED ORDER — ONDANSETRON HCL 4 MG PO TABS: 4.0000 mg | ORAL_TABLET | Freq: Four times a day (QID) | ORAL | Status: DC | PRN

## 2019-02-07 MED ORDER — DEXTROSE 50 % IV SOLN
25.0000 g | Freq: Once | INTRAVENOUS | Status: AC
  Administered 2019-02-07: 01:00:00 25 g via INTRAVENOUS
  Filled 2019-02-07: qty 50

## 2019-02-07 MED ORDER — METHADONE HCL 10 MG PO TABS
10.0000 mg | ORAL_TABLET | Freq: Two times a day (BID) | ORAL | Status: DC
  Administered 2019-02-07 – 2019-02-10 (×7): 10 mg via ORAL
  Filled 2019-02-07 (×7): qty 1

## 2019-02-07 MED ORDER — SODIUM BICARBONATE 650 MG PO TABS
1300.0000 mg | ORAL_TABLET | Freq: Two times a day (BID) | ORAL | Status: DC
  Administered 2019-02-07 – 2019-02-10 (×6): 1300 mg via ORAL
  Filled 2019-02-07 (×7): qty 2

## 2019-02-07 MED ORDER — SODIUM CHLORIDE 0.9 % IV SOLN
2.0000 g | INTRAVENOUS | Status: DC
  Administered 2019-02-07: 2 g via INTRAVENOUS
  Filled 2019-02-07 (×2): qty 2

## 2019-02-07 MED ORDER — SODIUM BICARBONATE 8.4 % IV SOLN
100.0000 meq | Freq: Once | INTRAVENOUS | Status: AC
  Administered 2019-02-07: 100 meq via INTRAVENOUS
  Filled 2019-02-07: qty 50

## 2019-02-07 MED ORDER — INSULIN REGULAR HUMAN 100 UNIT/ML IJ SOLN
10.0000 [IU] | Freq: Once | INTRAMUSCULAR | Status: AC
  Administered 2019-02-07: 13:00:00 10 [IU] via INTRAVENOUS
  Filled 2019-02-07: qty 10

## 2019-02-07 MED ORDER — SODIUM CHLORIDE 0.9 % IV BOLUS
1000.0000 mL | Freq: Once | INTRAVENOUS | Status: AC
  Administered 2019-02-07: 1000 mL via INTRAVENOUS

## 2019-02-07 MED ORDER — ONDANSETRON HCL 4 MG/2ML IJ SOLN: 4.0000 mg | Freq: Four times a day (QID) | INTRAMUSCULAR | Status: DC | PRN

## 2019-02-07 MED ORDER — HYDRALAZINE HCL 50 MG PO TABS
50.0000 mg | ORAL_TABLET | Freq: Three times a day (TID) | ORAL | Status: DC
  Administered 2019-02-07 – 2019-02-09 (×8): 50 mg via ORAL
  Filled 2019-02-07 (×9): qty 1

## 2019-02-07 MED ORDER — METHADONE HCL 10 MG PO TABS: 10.0000 mg | ORAL_TABLET | Freq: Two times a day (BID) | ORAL | Status: DC

## 2019-02-07 MED ORDER — METOPROLOL SUCCINATE ER 25 MG PO TB24
25.0000 mg | ORAL_TABLET | Freq: Every day | ORAL | Status: DC
  Administered 2019-02-07 – 2019-02-10 (×4): 25 mg via ORAL
  Filled 2019-02-07 (×4): qty 1

## 2019-02-07 MED ORDER — AMLODIPINE BESYLATE 10 MG PO TABS
10.0000 mg | ORAL_TABLET | Freq: Every day | ORAL | Status: DC
  Administered 2019-02-07 – 2019-02-10 (×4): 10 mg via ORAL
  Filled 2019-02-07 (×4): qty 1

## 2019-02-07 MED ORDER — ACETAMINOPHEN 650 MG RE SUPP: 650.0000 mg | Freq: Four times a day (QID) | RECTAL | Status: DC | PRN

## 2019-02-07 NOTE — ED Notes (Signed)
Report finished att, 4:15 on hold

## 2019-02-07 NOTE — Progress Notes (Signed)
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  He also had T8-T9 vertebral osteomyelitis and underwent laminectomy on 12/22/18.  He had MRSA bacteremia as well, with first negative cultures on 12/19/18.  Initial plan was for minimum 6 weeks IV antibiotics from negative culture date (12/19/18).  However, nursing facility [facility] seemed to indicate he may still have been on IV vancomycin at time of admission (02/06/19).  Unclear what follow-up he may or may not have had in the interim, especially given complications due to COVID-19 pandemic.  Barney Drain [facility] 02/07/2019, 8:21 PM  Note:  This document was prepared using Dragon voice recognition software and may include unintentional dictation errors.

## 2019-02-07 NOTE — Consult Note (Signed)
Pharmacy Antibiotic Note  Eugene [name] is a 58 y.o. male admitted on 02/06/2019 with UTI.  Pharmacy has been consulted for Cefepime dosing.  Plan: Start cefepime 2g IV every 24 hours   Weight: 233 lb 11 oz (106 kg)  Temp (24hrs), Avg:98.2 F (36.8 C), Min:98.2 F (36.8 C), Max:98.2 F (36.8 C)  Recent Labs  Lab 02/06/19 2317 02/06/19 2350  WBC 7.5  --   CREATININE 2.93*  --   LATICACIDVEN  --  0.6  VANCORANDOM 35  --     Estimated Creatinine Clearance: 35.9 mL/min (A) (by C-G formula based on SCr of 2.93 mg/dL (H)).    No Known Allergies  Antimicrobials this admission: Vancomycin PTA 5/16 Cefepime>> 5/16 Flagyl>>  Microbiology results: 5/15 BCx: pending 5/15 UCx: pending 5/16 MRSA PCR: pending 5/15 SARS Coronavirus 2: negative   Thank you for allowing pharmacy to be a part of this patient's care.  Gardner Candle, PharmD, BCPS Clinical Pharmacist 02/07/2019 2:42 AM

## 2019-02-07 NOTE — H&P (Addendum)
[facility] [place] [facility]   PATIENT NAME: Eugene [name]    MR#:  [id number]  DATE OF BIRTH:  Jun 12, 1961  DATE OF ADMISSION:  02/06/2019  PRIMARY CARE PHYSICIAN: Dimple Casey, MD   REQUESTING/REFERRING PHYSICIAN: Manson Passey, MD  CHIEF COMPLAINT:   Chief Complaint  Patient presents with  . Acute Renal Failure  . hyperkalemia    HISTORY OF PRESENT ILLNESS:  Eugene [name]  is a 58 y.o. male who presents with chief complaint as above.  Patient presents the ED after being told he had abnormal labs.  His creatinine is significantly elevated, and his potassium is very high.  Recently he has had a near normal creatinine.  Further evaluation in the ED shows likely UTI.  Patient does have an indwelling Foley catheter.  He is also been on long-term antibiotics, per chart review this is presumably for osteomyelitis, though there are sparse notes in our system.  He has been on vancomycin per his MAR from his facility.  Hospitalist called for admission  PAST MEDICAL HISTORY:   Past Medical History:  Diagnosis Date  . Diabetes mellitus without complication (HCC)   . Osteomyelitis (HCC)      PAST SURGICAL HISTORY:   Past Surgical History:  Procedure Laterality Date  . bilateral amputation Bilateral   . CENTRAL LINE INSERTION-TUNNELED N/A 02/02/2019   Procedure: CENTRAL LINE INSERTION-TUNNELED;  Surgeon: Annice Needy, MD;  Location: [facility] INVASIVE CV LAB;  Service: Cardiovascular;  Laterality: N/A;     SOCIAL HISTORY:   Social History   Tobacco Use  . Smoking status: Current Every Day Smoker    Packs/day: 0.25    Types: Cigarettes  . Smokeless tobacco: Never Used  Substance Use Topics  . Alcohol use: Not Currently     FAMILY HISTORY:    Family history reviewed and is non-contributory DRUG ALLERGIES:  No Known Allergies  MEDICATIONS AT HOME:   Prior to Admission medications   Medication Sig Start Date End Date Taking? Authorizing Provider   Amino Acids (AMINO ACID PO) Take 30 mLs by mouth.    [provider]  amLODipine (NORVASC) 10 MG tablet Take 10 mg by mouth daily.    [provider]  bisacodyl (DULCOLAX) 10 MG suppository Place 10 mg rectally as needed for moderate constipation.    [provider]  bisacodyl (FLEET) 10 MG/30ML ENEM Place 10 mg rectally once.    [provider]  docusate sodium (COLACE) 100 MG capsule Take 100 mg by mouth 2 (two) times daily.    [provider]  famotidine (PEPCID) 20 MG tablet Take 20 mg by mouth 2 (two) times daily.    [provider]  ferrous gluconate (FERGON) 324 MG tablet Take 324 mg by mouth daily with breakfast.    [provider]  furosemide (LASIX) 40 MG tablet Take 40 mg by mouth.    [provider]  heparin 5000 UNIT/ML injection Inject into the skin every 8 (eight) hours.    [provider]  hydrALAZINE (APRESOLINE) 50 MG tablet Take 50 mg by mouth 3 (three) times daily.    [provider]  ibuprofen (ADVIL) 800 MG tablet Take 800 mg by mouth every 8 (eight) hours as needed.    [provider]  insulin glargine (LANTUS) 100 UNIT/ML injection Inject into the skin daily.    [provider]  insulin lispro (HUMALOG) 100 UNIT/ML injection Inject into the skin 3 (three) times daily before  meals.    [provider]  lactulose (CEPHULAC) 20 g packet Take 20 g by mouth 3 (three) times daily.    [provider]  linaclotide (LINZESS) 290 MCG CAPS capsule Take 290 mcg by mouth daily before breakfast.    [provider]  lisinopril (ZESTRIL) 20 MG tablet Take 20 mg by mouth daily.    [provider]  methadone (DOLOPHINE) 10 MG/5ML solution Take by mouth every 6 (six) hours as needed for pain.    [provider]  methocarbamol (ROBAXIN) 500 MG tablet Take 500 mg by mouth 4 (four) times daily.    [provider]  metoprolol succinate  (TOPROL-XL) 25 MG 24 hr tablet Take 25 mg by mouth daily.    [provider]  oxycodone (OXY-IR) 5 MG capsule Take 5 mg by mouth every 4 (four) hours as needed.    [provider]  polyethylene glycol (MIRALAX / GLYCOLAX) 17 g packet Take 17 g by mouth daily.    [provider]  potassium chloride SA (K-DUR) 20 MEQ tablet Take 20 mEq by mouth 2 (two) times daily.    [provider]  pravastatin (PRAVACHOL) 40 MG tablet Take 40 mg by mouth daily.    [provider]  pregabalin (LYRICA) 75 MG capsule Take 75 mg by mouth 2 (two) times daily.    [provider]  traZODone (DESYREL) 150 MG tablet Take 150 mg by mouth at bedtime.    [provider]  vancomycin IVPB Inject 1,250 mg into the vein.    [provider]    REVIEW OF SYSTEMS:  ROS   VITAL SIGNS:   Vitals:   02/06/19 2325 02/06/19 2326 02/07/19 0031  BP: (!) 99/59  (!) 95/53  Pulse: 84  81  Resp: 13  11  Temp: 98.2 F (36.8 C)    TempSrc: Oral    SpO2: 100%  98%  Weight:  106 kg    Wt Readings from Last 3 Encounters:  02/06/19 106 kg  02/02/19 106.6 kg    PHYSICAL EXAMINATION:  Physical Exam  Vitals reviewed. Constitutional: He is oriented to person, place, and time. He appears well-developed and well-nourished. No distress.  HENT:  Head: Normocephalic and atraumatic.  Mouth/Throat: Oropharynx is clear and moist.  Eyes: Pupils are equal, round, and reactive to light. Conjunctivae and EOM are normal. No scleral icterus.  Neck: Normal range of motion. Neck supple. No JVD present. No thyromegaly present.  Cardiovascular: Normal rate, regular rhythm and intact distal pulses. Exam reveals no gallop and no friction rub.  No murmur heard. Respiratory: Effort normal and breath sounds normal. No respiratory distress. He has no wheezes. He has no rales.  GI: Soft. Bowel sounds are normal. He exhibits no distension. There is no abdominal tenderness.   Musculoskeletal: Normal range of motion.        General: Deformity (Bilateral AKA) present. No edema.     Comments: No arthritis, no gout  Lymphadenopathy:    He has no cervical adenopathy.  Neurological: He is alert and oriented to person, place, and time. No cranial nerve deficit.  No dysarthria, no aphasia  Skin: Skin is warm and dry. No rash noted. No erythema.  Psychiatric: He has a normal mood and affect. His behavior is normal. Judgment and thought content normal.    LABORATORY PANEL:   CBC Recent Labs  Lab 02/06/19 2317  WBC 7.5  HGB 8.4*  HCT 27.5*  PLT 279   ------------------------------------------------------------------------------------------------------------------  Chemistries  Recent Labs  Lab 02/06/19 2317  NA 137  K 7.5*  CL 114*  CO2 14*  GLUCOSE 149*  BUN 93*  CREATININE 2.93*  CALCIUM 9.1  AST 14*  ALT 16  ALKPHOS 56  BILITOT 0.4   ------------------------------------------------------------------------------------------------------------------  Cardiac Enzymes No results for input(s): TROPONINI in the last 168 hours. ------------------------------------------------------------------------------------------------------------------  RADIOLOGY:  No results found.  EKG:   Orders placed or performed during the hospital encounter of 02/06/19  . EKG 12-Lead  . EKG 12-Lead  . ED EKG 12-Lead  . ED EKG 12-Lead    IMPRESSION AND PLAN:  Principal Problem:   UTI (urinary tract infection) -IV antibiotics given, culture ordered Active Problems:   AKI (acute kidney injury) (HCC) -IV fluids, avoid nephrotoxins and monitor.  Hold vancomycin for now.  Further investigation of whether or not he has osteomyelitis and what he was on long-term antibiotics for will need to be performed.  MAR reports he is on vancomycin for MRSA.  ID consult might be helpful if further information cannot be obtained.  If patient's creatinine does not improve with  treatment of UTI and hydration nephrology consult can be considered   Hyperkalemia -temporizing measures ordered in the ED, Veltassa given, monitor closely, no EKG changes   Diabetes (HCC) -sliding scale insulin coverage  Chart review performed and case discussed with ED provider. Labs, imaging and/or ECG reviewed by provider and discussed with patient/family. Management plans discussed with the patient and/or family.  COVID-19 status: Test pending  DVT PROPHYLAXIS: SubQ heparin  GI PROPHYLAXIS:  None  ADMISSION STATUS: Inpatient     CODE STATUS: Full Advance Directive Documentation     Most Recent Value  Type of Advance Directive  Out of facility DNR (pink MOST or yellow form)  Pre-existing out of facility DNR order (yellow form or pink MOST form)  Yellow form placed in chart (order not valid for inpatient use)  "MOST" Form in Place?  -      TOTAL TIME TAKING CARE OF THIS PATIENT: 45 minutes.   This patient was evaluated in the context of the global COVID-19 pandemic, which necessitated consideration that the patient might be at risk for infection with the SARS-CoV-2 virus that causes COVID-19. Institutional protocols and algorithms that pertain to the evaluation of patients at risk for COVID-19 are in a state of rapid change based on information released by regulatory bodies including the CDC and federal and state organizations. These policies and algorithms were followed to the best of this provider's knowledge to date during the patient's care at this facility.  Eugene [name] 02/07/2019, 1:03 AM  [facility]  Office  [phone]  CC: Primary care physician; Dimple CaseySmith, Sean A, MD  Note:  This document was prepared using Dragon voice recognition software and may include unintentional dictation errors.

## 2019-02-07 NOTE — ED Notes (Signed)
ED TO INPATIENT HANDOFF REPORT  ED Nurse Name and Phone #: Danelle Earthlyoel  [phone]  S Name/Age/Gender Eugene [name] 58 y.o. male Room/Bed: ED26A/ED26A  Code Status   Code Status: Not on file  Home/SNF/Other Skilled nursing facility Patient oriented to: self, place, time and situation Is this baseline? 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  Hx of DM     Allergies No Known Allergies  Level of Care/Admitting Diagnosis ED Disposition    ED Disposition Condition Comment   Admit  [facility] [[id number]]  Level of Care: Med-Surg [16]  Covid Evaluation: Screening Protocol (No Symptoms)  Diagnosis: UTI (urinary tract infection) [454098][[id number]]  Admitting Physician: Oralia ManisWILLIS, DAVID [[id number]]  Attending Physician: Oralia ManisWILLIS, DAVID [phone][[id number]]  Estimated length of stay: past midnight tomorrow  Certification:: I certify this patient will need inpatient services for at least 2 midnights  PT Class (Do Not Modify): Inpatient [101]  PT Acc Code (Do Not Modify): Private [1]       B Medical/Surgery History Past Medical History:  Diagnosis Date  . Diabetes mellitus without complication (HCC)   . Osteomyelitis [facility](HCC)    Past Surgical History:  Procedure Laterality Date  . bilateral amputation Bilateral   . CENTRAL LINE INSERTION-TUNNELED N/A 02/02/2019   Procedure: CENTRAL LINE INSERTION-TUNNELED;  Surgeon: Annice Needyew, Jason S, MD;  Location: [facility] INVASIVE CV LAB;  Service: Cardiovascular;  Laterality: N/A;     A IV Location/Drains/Wounds Patient  Lines/Drains/Airways Status   Active Line/Drains/Airways    Name:   Placement date:   Placement time:   Site:   Days:   Peripheral IV 02/06/19 Left;Upper Arm   02/06/19    2313    Arm   1   PICC Single Lumen 01/19/19   01/19/19    0000    -   19   CVC Single Lumen 02/07/19 Right Subclavian   02/07/19    0041    Subclavian   less than 1          Intake/Output Last 24 hours  Intake/Output Summary (Last 24 hours) at 02/07/2019 0207 Last data filed at 02/07/2019 0207 Gross per 24 hour  Intake 1444.48 ml  Output -  Net 1444.48 ml    Labs/Imaging Results for orders placed or performed during the hospital encounter of 02/06/19 (from the past 48 hour(s))  Comprehensive metabolic panel     Status: Abnormal   Collection Time: 02/06/19 11:17 PM  Result Value Ref Range   Sodium 137 135 - 145 mmol/L   Potassium 7.5 (HH) 3.5 - 5.1 mmol/L    Comment: CRITICAL RESULT CALLED TO, READ BACK BY AND VERIFIED WITH Betheny Suchecki WEBSTER ON 02/07/19 AT 0003 [facility]    Chloride 114 (H) 98 - 111 mmol/L   CO2 14 (L) 22 - 32 mmol/L   Glucose, Bld 149 (H) 70 - 99 mg/dL   BUN 93 (H) 6 - 20 mg/dL   Creatinine, Ser 0.862.93 (H) 0.61 - 1.24 mg/dL   Calcium 9.1 8.9 - 57.810.3 mg/dL   Total Protein 7.8 6.5 - 8.1 g/dL   Albumin 2.6 (L)  3.5 - 5.0 g/dL   AST 14 (L) 15 - 41 U/L   ALT 16 0 - 44 U/L   Alkaline Phosphatase 56 38 - 126 U/L   Total Bilirubin 0.4 0.3 - 1.2 mg/dL   GFR calc non Af Amer 23 (L) >60 mL/min   GFR calc Af Amer 27 (L) >60 mL/min   Anion gap 9 5 - 15    Comment: Performed at [facility], [address] [place], [place] [address]  CBC WITH DIFFERENTIAL     Status: Abnormal   Collection Time: 02/06/19 11:17 PM  Result Value Ref Range   WBC 7.5 4.0 - 10.5 K/uL   RBC 2.96 (L) 4.22 - 5.81 MIL/uL   Hemoglobin 8.4 (L) 13.0 - 17.0 g/dL   HCT 42.5 (L) 95.6 - 38.7 %   MCV 92.9 80.0 - 100.0 fL   MCH 28.4 26.0 - 34.0 pg   MCHC 30.5 30.0 - 36.0 g/dL   RDW 56.4 33.2 - 95.1 %   Platelets 279 150 - 400  K/uL   nRBC 0.0 0.0 - 0.2 %   Neutrophils Relative % 68 %   Neutro Abs 5.2 1.7 - 7.7 K/uL   Lymphocytes Relative 19 %   Lymphs Abs 1.4 0.7 - 4.0 K/uL   Monocytes Relative 8 %   Monocytes Absolute 0.6 0.1 - 1.0 K/uL   Eosinophils Relative 4 %   Eosinophils Absolute 0.3 0.0 - 0.5 K/uL   Basophils Relative 1 %   Basophils Absolute 0.0 0.0 - 0.1 K/uL   Immature Granulocytes 0 %   Abs Immature Granulocytes 0.03 0.00 - 0.07 K/uL    Comment: Performed at [facility], [address] [place], [place] [address]  Vancomycin, random     Status: None   Collection Time: 02/06/19 11:17 PM  Result Value Ref Range   Vancomycin Rm 35     Comment:        Random Vancomycin therapeutic range is dependent on dosage and time of specimen collection. A peak range is 20.0-40.0 ug/mL A trough range is 5.0-15.0 ug/mL        Performed at [facility], [address] [place], [place] [address]   Lactic acid, plasma     Status: None   Collection Time: 02/06/19 11:50 PM  Result Value Ref Range   Lactic Acid, Venous 0.6 0.5 - 1.9 mmol/L    Comment: Performed at [facility], [address] [place], [place] [address]  Urinalysis, Routine w reflex microscopic     Status: Abnormal   Collection Time: 02/06/19 11:50 PM  Result Value Ref Range   Color, Urine AMBER (A) YELLOW    Comment: BIOCHEMICALS MAY BE AFFECTED BY COLOR   APPearance TURBID (A) CLEAR   Specific Gravity, Urine 1.020 1.005 - 1.030   pH 5.0 5.0 - 8.0   Glucose, UA NEGATIVE NEGATIVE mg/dL   Hgb urine dipstick SMALL (A) NEGATIVE   Bilirubin Urine NEGATIVE NEGATIVE   Ketones, ur NEGATIVE NEGATIVE mg/dL   Protein, ur 932 (A) NEGATIVE mg/dL   Nitrite NEGATIVE NEGATIVE   Leukocytes,Ua LARGE (A) NEGATIVE   RBC / HPF >50 (H) 0 - 5 RBC/hpf   WBC, UA >50 (H) 0 - 5 WBC/hpf   Bacteria, UA NONE SEEN NONE SEEN   Squamous Epithelial / LPF NONE SEEN 0 - 5   WBC Clumps PRESENT    Mucus PRESENT     Comment: Performed  at [facility],  [address], [place], [place] [address]  SARS Coronavirus 2 (CEPHEID - Performed in [facility] hospital lab), Hosp Order     Status: None   Collection Time: 02/06/19 11:50 PM  Result Value Ref Range   SARS Coronavirus 2 NEGATIVE NEGATIVE    Comment: (NOTE) If result is NEGATIVE SARS-CoV-2 target nucleic acids are NOT DETECTED. The SARS-CoV-2 RNA is generally detectable in upper and lower  respiratory specimens during the acute phase of infection. The lowest  concentration of SARS-CoV-2 viral copies this assay can detect is 250  copies / mL. A negative result does not preclude SARS-CoV-2 infection  and should not be used as the sole basis for treatment or other  patient management decisions.  A negative result may occur with  improper specimen collection / handling, submission of specimen other  than nasopharyngeal swab, presence of viral mutation(s) within the  areas targeted by this assay, and inadequate number of viral copies  (<250 copies / mL). A negative result must be combined with clinical  observations, patient history, and epidemiological information. If result is POSITIVE SARS-CoV-2 target nucleic acids are DETECTED. The SARS-CoV-2 RNA is generally detectable in upper and lower  respiratory specimens dur ing the acute phase of infection.  Positive  results are indicative of active infection with SARS-CoV-2.  Clinical  correlation with patient history and other diagnostic information is  necessary to determine patient infection status.  Positive results do  not rule out bacterial infection or co-infection with other viruses. If result is PRESUMPTIVE POSTIVE SARS-CoV-2 nucleic acids MAY BE PRESENT.   A presumptive positive result was obtained on the submitted specimen  and confirmed on repeat testing.  While 2019 novel coronavirus  (SARS-CoV-2) nucleic acids may be present in the submitted sample  additional confirmatory testing may be necessary  for epidemiological  and / or clinical management purposes  to differentiate between  SARS-CoV-2 and other Sarbecovirus currently known to infect humans.  If clinically indicated additional testing with an alternate test  methodology [phone]) is advised. The SARS-CoV-2 RNA is generally  detectable in upper and lower respiratory sp ecimens during the acute  phase of infection. The expected result is Negative. Fact Sheet for Patients:  BoilerBrush.com.cy Fact Sheet for Healthcare Providers: https://pope.com/ This test is not yet approved or cleared by the [place] FDA and has been authorized for detection and/or diagnosis of SARS-CoV-2 by FDA under an Emergency Use Authorization (EUA).  This EUA will remain in effect (meaning this test can be used) for the duration of the COVID-19 declaration under Section 564(b)(1) of the Act, 21 U.S.C. section 360bbb-3(b)(1), unless the authorization is terminated or revoked sooner. Performed at [facility], [address] [place], [place] [address]    No results found.  Pending Labs Unresulted Labs (From admission, onward)    Start     Ordered   02/07/19 0300  Potassium  Once,   STAT    Comments:  Call M.D. on call if potassium under 3.5 or above 4.9    02/07/19 0138   02/07/19 0106  Urine Culture  Add-on,   AD     02/07/19 0105   02/06/19 2336  Blood Culture (routine x 2)  BLOOD CULTURE X 2,   STAT     02/06/19 2335   Signed and Held  HIV antibody (Routine Testing)  Once,   R     Signed and Held   Signed and Held  CBC  (heparin)  Once,  R    Comments:  Baseline for heparin therapy IF NOT ALREADY DRAWN.  Notify MD if PLT < 100 K.    Signed and Held   Signed and Held  Creatinine, serum  (heparin)  Once,   R    Comments:  Baseline for heparin therapy IF NOT ALREADY DRAWN.    Signed and Held   Signed and Held  Basic metabolic panel  Tomorrow morning,   R     Signed and Held    Signed and Held  CBC  Tomorrow morning,   R     Signed and Held          Vitals/Pain Today's Vitals   02/07/19 0031 02/07/19 0100 02/07/19 0130 02/07/19 0206  BP: (!) 95/53 (!) 108/59 (!) 101/46 115/60  Pulse: 81   83  Resp: Temp:      TempSrc:      SpO2: 98%   99%  Weight:      PainSc: 0-No pain   0-No pain    Isolation Precautions No active isolations  Medications Medications  ceFEPIme (MAXIPIME) 2 g in sodium chloride 0.9 % 100 mL IVPB (0 g Intravenous Stopped 02/07/19 0008)  metroNIDAZOLE (FLAGYL) IVPB 500 mg ( Intravenous Stopped 02/07/19 0204)  sodium bicarbonate injection 100 mEq (100 mEq Intravenous Given 02/07/19 0040)  insulin aspart (novoLOG) injection 6 Units (6 Units Intravenous Given 02/07/19 0036)  dextrose 50 % solution 25 g (25 g Intravenous Given 02/07/19 0034)  sodium chloride 0.9 % bolus 1,000 mL (0 mLs Intravenous Stopped 02/07/19 0207)  patiromer (VELTASSA) packet 8.4 g (8.4 g Oral Given 02/07/19 0049)  calcium chloride injection 1 g (1 g Intravenous Given 02/07/19 0048)    Mobility non-ambulatory Moderate fall risk   Focused Assessments Neuro Assessment Handoff:  Swallow screen pass? yes Cardiac Rhythm: Normal sinus rhythm       Neuro Assessment:   Neuro Checks:      Last Documented NIHSS Modified Score:   Has TPA been given? No If patient is a Neuro Trauma and patient is going to OR before floor call report to 4N Charge nurse: [phone] or [phone]     R Recommendations: See Admitting Provider Note  Report given to:   Additional Notes:    Delay to call report d/t emergent surgery in room 2

## 2019-02-07 NOTE — ED Notes (Signed)
Pharm called for meds  

## 2019-02-07 NOTE — Progress Notes (Signed)
CODE SEPSIS - PHARMACY COMMUNICATION  **Broad Spectrum Antibiotics should be administered within 1 hour of Sepsis diagnosis**  Time Code Sepsis Called/Page Received: @ 2339   Antibiotics Ordered: Cefepime, Vancomycin and Flagyl   Time of 1st antibiotic administration: @ 0002  Additional action taken by pharmacy: Spoke with RN and MD about vancomycin PTA.  Pharmacy ordered random vancomycin level and recommended holding vancomycin until level resulted. Vancomycin random level resulted 5/16 @ 0010 and was at 35. Vancomycin dose was held.   If necessary, Name of Provider/Nurse Contacted: Danelle Earthly, Charity fundraiser. And Dr. Bettye Boeck, PharmD, BCPS Clinical Pharmacist 02/07/2019 12:45 AM

## 2019-02-07 NOTE — Progress Notes (Signed)
Dr Sheryle Hail notified of critical potassium 6.7. No orders received

## 2019-02-07 NOTE — Consult Note (Signed)
Date: 02/07/2019                  Patient Name:  Eugene [name]  MRN: [id number]  DOB: 10/24/1960  Age / Sex: 58 y.o., male         PCP: Dimple Casey, MD                 Service Requesting Consult: IM/ Jama Flavors, MD                 Reason for Consult: ARF            History of Present Illness: Patient is a 58 y.o. 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 Upon admission patient was noted to have significantly elevated creatinine BUN and potassium Creatinine increased to 2.93, potassium was 7.5 Repeat creatinine today is 2.54 Potassium treated with shifting measures.  Down to 6.7 this morning Patient is also noted to be anemic Patient is a resident of [facility].  He is originally from [facility]. He recently had a Hickman placed on May 11 due to need for long-term IV antibiotics He states that he is taking long-term antibiotics for infection in his back. His main complaint today is back pain.  He is concerned that he is not able to sit up and move around without having pain. No nausea or vomiting.  Appetite is good.  No diarrhea or constipation reported.  No blood in the urine or stool.  Medications: Outpatient medications: Medications Prior to Admission  Medication Sig Dispense Refill Last Dose  . acetaminophen (TYLENOL) 325 MG tablet Take 650 mg by mouth every 4 (four) hours as needed for mild pain or fever.   prn at prn  . Amino Acids (AMINO ACID PO) Take 30 mLs by mouth.   02/06/2019 at Unknown time  . amLODipine (NORVASC) 10 MG tablet Take 10 mg by mouth daily.   02/06/2019 at Unknown time  . bisacodyl (DULCOLAX) 10 MG suppository Place 10 mg rectally daily.    02/06/2019 at Unknown time  . bisacodyl (DULCOLAX) 5 MG EC tablet Take 10 mg by mouth daily.   02/06/2019 at Unknown time  . diphenhydrAMINE (BENADRYL) 50 MG capsule Take 50 mg by  mouth every 6 (six) hours as needed for itching.   prn at prn  . docusate sodium (COLACE) 100 MG capsule Take 100 mg by mouth 2 (two) times daily.   02/06/2019 at Unknown time  . famotidine (PEPCID) 20 MG tablet Take 20 mg by mouth 2 (two) times daily.   02/06/2019 at Unknown time  . ferrous gluconate (FERGON) 324 MG tablet Take 324 mg by mouth daily with breakfast.   02/06/2019 at Unknown time  . furosemide (LASIX) 40 MG tablet Take 40 mg by mouth 2 (two) times daily.    02/06/2019 at Unknown time  . heparin 5000 UNIT/ML injection Inject 5,000 Units into the skin every 12 (twelve) hours.    02/06/2019 at 2100  . heparin flush 10 UNIT/ML SOLN injection Inject 50 Units into the vein 2 (two) times daily. Also flush with 10ml of normal saline   02/06/2019 at Unknown time  . hydrALAZINE (APRESOLINE) 50 MG tablet Take 50 mg by mouth 3 (three) times daily.   02/06/2019 at 1600  . ibuprofen (ADVIL) 800 MG tablet Take 800 mg by mouth 3 (three) times daily.  02/06/2019 at Unknown time  . insulin glargine (LANTUS) 100 UNIT/ML injection Inject 20 Units into the skin at bedtime.    02/06/2019 at Unknown time  . insulin lispro (HUMALOG) 100 UNIT/ML injection Inject 0-15 Units into the skin 4 (four) times daily -  before meals and at bedtime. <150= 0 units 151-200= 3 units 201-250= 6 units 251-300= 9 units 301-350= 12 units 351-400= 15 units >400 call MD   02/06/2019 at Unknown time  . Lactulose 20 GM/30ML SOLN Take 30 g by mouth 2 (two) times daily.    02/06/2019 at Unknown time  . linaclotide (LINZESS) 290 MCG CAPS capsule Take 290 mcg by mouth daily before breakfast.   02/06/2019 at Unknown time  . lisinopril (ZESTRIL) 20 MG tablet Take 20 mg by mouth daily.   02/06/2019 at Unknown time  . methadone (DOLOPHINE) 10 MG tablet Take 10 mg by mouth 2 (two) times daily.    02/06/2019 at Unknown time  . methocarbamol (ROBAXIN) 500 MG tablet Take 500 mg by mouth 4 (four) times daily.   02/06/2019 at Unknown time  . metoprolol  succinate (TOPROL-XL) 25 MG 24 hr tablet Take 25 mg by mouth daily.   02/06/2019 at 0900  . oxycodone (OXY-IR) 5 MG capsule Take 5 mg by mouth every 4 (four) hours as needed for pain.    prn at prn  . polyethylene glycol (MIRALAX / GLYCOLAX) 17 g packet Take 17 g by mouth daily.   02/06/2019 at Unknown time  . potassium chloride SA (K-DUR) 20 MEQ tablet Take 20 mEq by mouth daily.    02/06/2019 at Unknown time  . pravastatin (PRAVACHOL) 40 MG tablet Take 40 mg by mouth daily.   02/06/2019 at Unknown time  . pregabalin (LYRICA) 75 MG capsule Take 75 mg by mouth 2 (two) times daily.   02/06/2019 at Unknown time  . sodium chloride 0.9 % injection Inject 10 mLs into the vein 2 (two) times daily. Also flush with 5ml of Heparin 10units/ ml   02/06/2019 at Unknown time  . sodium phosphate Pediatric (FLEET) 3.5-9.5 GM/59ML enema Place 1 enema rectally every other day as needed for severe constipation.   prn at prn  . traZODone (DESYREL) 150 MG tablet Take 150 mg by mouth at bedtime.   02/06/2019 at Unknown time  . vancomycin IVPB Inject 1,250 mg into the vein every 18 (eighteen) hours.    02/06/2019 at 2037    Current medications: Current Facility-Administered Medications  Medication Dose Route Frequency Provider Last Rate Last Dose  . acetaminophen (TYLENOL) tablet 650 mg  650 mg Oral Q6H PRN Oralia Manis, MD       Or  . acetaminophen (TYLENOL) suppository 650 mg  650 mg Rectal Q6H PRN Oralia Manis, MD      . ceFEPIme (MAXIPIME) 2 g in sodium chloride 0.9 % 100 mL IVPB  2 g Intravenous Q24H Hallaji, Sheema M, RPH      . dextrose 50 % solution 50 mL  1 ampule Intravenous Once Ojie, Jude, MD      . heparin injection 5,000 Units  5,000 Units Subcutaneous Willow Ora, MD   5,000 Units at 02/07/19 0549  . insulin aspart (novoLOG) injection 0-5 Units  0-5 Units Subcutaneous QHS Oralia Manis, MD      . insulin aspart (novoLOG) injection 0-9 Units  0-9 Units Subcutaneous TID WC Oralia Manis, MD   1 Units at  02/07/19 [phone]  . insulin regular (NOVOLIN R) 100 units/mL injection  10 Units  10 Units Intravenous Once Ojie, Jude, MD      . ondansetron (ZOFRAN) tablet 4 mg  4 mg Oral Q6H PRN Oralia Manis, MD       Or  . ondansetron [facility]) injection 4 mg  4 mg Intravenous Q6H PRN Oralia Manis, MD      . patiromer (VELTASSA) packet 16.8 g  16.8 g Oral Daily Ojie, Jude, MD          Allergies: No Known Allergies    Past Medical History: Past Medical History:  Diagnosis Date  . Diabetes mellitus without complication (HCC)   . Osteomyelitis [facility])      Past Surgical History: Past Surgical History:  Procedure Laterality Date  . bilateral amputation Bilateral   . CENTRAL LINE INSERTION-TUNNELED N/A 02/02/2019   Procedure: CENTRAL LINE INSERTION-TUNNELED;  Surgeon: Annice Needy, MD;  Location: [facility] INVASIVE CV LAB;  Service: Cardiovascular;  Laterality: N/A;     Family History: History reviewed. No pertinent family history.   Social History: Social History   Socioeconomic History  . Marital status: Single    Spouse name: Not on file  . Number of children: Not on file  . Years of education: Not on file  . Highest education level: Not on file  Occupational History  . Not on file  Social Needs  . Financial resource strain: Somewhat hard  . Food insecurity:    Worry: Sometimes true    Inability: Sometimes true  . Transportation needs:    Medical: No    Non-medical: No  Tobacco Use  . Smoking status: Current Every Day Smoker    Packs/day: 0.25    Types: Cigarettes  . Smokeless tobacco: Never Used  Substance and Sexual Activity  . Alcohol use: Not Currently  . Drug use: Not on file  . Sexual activity: Not on file  Lifestyle  . Physical activity:    Days per week: 0 days    Minutes per session: Not on file  . Stress: To some extent  Relationships  . Social connections:    Talks on phone: More than three times a week    Gets together: More than three times a week    Attends  religious service: More than 4 times per year    Active member of club or organization: Not on file    Attends meetings of clubs or organizations: Never    Relationship status: Never married  . Intimate partner violence:    Fear of current or ex partner: Patient refused    Emotionally abused: Patient refused    Physically abused: Patient refused    Forced sexual activity: Patient refused  Other Topics Concern  . Not on file  Social History Narrative  . Not on file     Review of Systems: Gen: Denies any acute complaints, no fevers or chills HEENT: Poor vision due to diabetic retinopathy CV: No chest pain Resp: No shortness of breath, cough, sputum GI: Appetite is good.  No nausea or vomiting or diarrhea reported GU : Currently has a Foley catheter in place MS: Reports chronic back pain Derm:    No complaints Psych: No complaints Heme: No complaints Neuro: No complaints Endocrine.  Poorly controlled diabetes in the past.  Vital Signs: Blood pressure (!) 153/81, pulse 81, temperature 98.2 F (36.8 C), temperature source Oral, resp. rate 18, weight 106 kg, SpO2 100 %.   Intake/Output Summary (Last 24 hours) at 02/07/2019 1116 Last data filed at 02/07/2019  0207 Gross per 24 hour  Intake 1444.48 ml  Output -  Net 1444.48 ml    Weight trends: [employer]   02/06/19 2326  Weight: 106 kg    Physical Exam: Eugene:  Chronically ill-appearing gentleman, laying in the bed  HEENT  anicteric, moist oral mucous membranes  Neck:  Supple, no masses  Lungs:  Normal breathing effort, clear to auscultation  Heart::  Regular rhythm, soft systolic murmur  Abdomen:  Soft, nontender, nondistended  Extremities:  Bilateral below the knee amputation, no edema  Neurologic:  Alert, able to answer questions  Skin:  No acute rashes  Access:  Right IJ Hickman  Foley:  Catheter in place       Lab results: Basic Metabolic Panel: Recent Labs  Lab 02/06/19 2317 02/07/19 0342  NA 137  137  K 7.5* 6.7*  CL 114* 116*  CO2 14* 15*  GLUCOSE 149* 169*  BUN 93* 86*  CREATININE 2.93* 2.54*  CALCIUM 9.1 9.3    Liver Function Tests: Recent Labs  Lab 02/06/19 2317  AST 14*  ALT 16  ALKPHOS 56  BILITOT 0.4  PROT 7.8  ALBUMIN 2.6*   No results for input(s): LIPASE, AMYLASE in the last 168 hours. No results for input(s): AMMONIA in the last 168 hours.  CBC: Recent Labs  Lab 02/06/19 2317 02/07/19 0342  WBC 7.5 7.5  NEUTROABS 5.2  --   HGB 8.4* 8.1*  HCT 27.5* 25.7*  MCV 92.9 93.8  PLT 279 255    Cardiac Enzymes: No results for input(s): CKTOTAL, TROPONINI in the last 168 hours.  BNP: Invalid input(s): POCBNP  CBG: Recent Labs  Lab 02/02/19 1202 02/02/19 1408 02/07/19 0330 02/07/19 0745  GLUCAP 172* 185* 204* 134*    Microbiology: Recent Results (from the past 720 hour(s))  SARS Coronavirus 2 (CEPHEID - Performed in [facility] hospital lab), Hosp Order     Status: None   Collection Time: 02/02/19 11:36 AM  Result Value Ref Range Status   SARS Coronavirus 2 NEGATIVE NEGATIVE Final    Comment: (NOTE) If result is NEGATIVE SARS-CoV-2 target nucleic acids are NOT DETECTED. The SARS-CoV-2 RNA is generally detectable in upper and lower  respiratory specimens during the acute phase of infection. The lowest  concentration of SARS-CoV-2 viral copies this assay can detect is 250  copies / mL. A negative result does not preclude SARS-CoV-2 infection  and should not be used as the sole basis for treatment or other  patient management decisions.  A negative result may occur with  improper specimen collection / handling, submission of specimen other  than nasopharyngeal swab, presence of viral mutation(s) within the  areas targeted by this assay, and inadequate number of viral copies  (<250 copies / mL). A negative result must be combined with clinical  observations, patient history, and epidemiological information. If result is POSITIVE SARS-CoV-2  target nucleic acids are DETECTED. The SARS-CoV-2 RNA is generally detectable in upper and lower  respiratory specimens dur ing the acute phase of infection.  Positive  results are indicative of active infection with SARS-CoV-2.  Clinical  correlation with patient history and other diagnostic information is  necessary to determine patient infection status.  Positive results do  not rule out bacterial infection or co-infection with other viruses. If result is PRESUMPTIVE POSTIVE SARS-CoV-2 nucleic acids MAY BE PRESENT.   A presumptive positive result was obtained on the submitted specimen  and confirmed on repeat testing.  While 2019 novel coronavirus  (  SARS-CoV-2) nucleic acids may be present in the submitted sample  additional confirmatory testing may be necessary for epidemiological  and / or clinical management purposes  to differentiate between  SARS-CoV-2 and other Sarbecovirus currently known to infect humans.  If clinically indicated additional testing with an alternate test  methodology [phone]([id number]) is advised. The SARS-CoV-2 RNA is generally  detectable in upper and lower respiratory sp ecimens during the acute  phase of infection. The expected result is Negative. Fact Sheet for Patients:  BoilerBrush.com.cyhttps://www.fda.gov/media/136312/download Fact Sheet for Healthcare Providers: https://pope.com/https://www.fda.gov/media/136313/download This test is not yet approved or cleared by the [place] FDA and has been authorized for detection and/or diagnosis of SARS-CoV-2 by FDA under an Emergency Use Authorization (EUA).  This EUA will remain in effect (meaning this test can be used) for the duration of the COVID-19 declaration under Section 564(b)(1) of the Act, 21 U.S.C. section 360bbb-3(b)(1), unless the authorization is terminated or revoked sooner. Performed at [facility], [address] [place], [place] [address]   Blood Culture (routine x 2)     Status: None (Preliminary result)    Collection Time: 02/06/19 11:50 PM  Result Value Ref Range Status   Specimen Description BLOOD LEFT UPPER ARM  Final   Special Requests   Final    BOTTLES DRAWN AEROBIC AND ANAEROBIC Blood Culture adequate volume   Culture   Final    NO GROWTH < 12 HOURS Performed at [facility], [address] [place], [place] [address]    Report Status PENDING  Incomplete  Blood Culture (routine x 2)     Status: None (Preliminary result)   Collection Time: 02/06/19 11:50 PM  Result Value Ref Range Status   Specimen Description BLOOD LEFT HAND  Final   Special Requests   Final    BOTTLES DRAWN AEROBIC AND ANAEROBIC Blood Culture results may not be optimal due to an excessive volume of blood received in culture bottles   Culture   Final    NO GROWTH < 12 HOURS Performed at [facility], [address] [place], [place] [address]    Report Status PENDING  Incomplete  SARS Coronavirus 2 (CEPHEID - Performed in [facility] hospital lab), Hosp Order     Status: None   Collection Time: 02/06/19 11:50 PM  Result Value Ref Range Status   SARS Coronavirus 2 NEGATIVE NEGATIVE Final    Comment: (NOTE) If result is NEGATIVE SARS-CoV-2 target nucleic acids are NOT DETECTED. The SARS-CoV-2 RNA is generally detectable in upper and lower  respiratory specimens during the acute phase of infection. The lowest  concentration of SARS-CoV-2 viral copies this assay can detect is 250  copies / mL. A negative result does not preclude SARS-CoV-2 infection  and should not be used as the sole basis for treatment or other  patient management decisions.  A negative result may occur with  improper specimen collection / handling, submission of specimen other  than nasopharyngeal swab, presence of viral mutation(s) within the  areas targeted by this assay, and inadequate number of viral copies  (<250 copies / mL). A negative result must be combined with clinical  observations, patient history, and  epidemiological information. If result is POSITIVE SARS-CoV-2 target nucleic acids are DETECTED. The SARS-CoV-2 RNA is generally detectable in upper and lower  respiratory specimens dur ing the acute phase of infection.  Positive  results are indicative of active infection with SARS-CoV-2.  Clinical  correlation with patient history and other diagnostic  information is  necessary to determine patient infection status.  Positive results do  not rule out bacterial infection or co-infection with other viruses. If result is PRESUMPTIVE POSTIVE SARS-CoV-2 nucleic acids MAY BE PRESENT.   A presumptive positive result was obtained on the submitted specimen  and confirmed on repeat testing.  While 2019 novel coronavirus  (SARS-CoV-2) nucleic acids may be present in the submitted sample  additional confirmatory testing may be necessary for epidemiological  and / or clinical management purposes  to differentiate between  SARS-CoV-2 and other Sarbecovirus currently known to infect humans.  If clinically indicated additional testing with an alternate test  methodology [phone]) is advised. The SARS-CoV-2 RNA is generally  detectable in upper and lower respiratory sp ecimens during the acute  phase of infection. The expected result is Negative. Fact Sheet for Patients:  BoilerBrush.com.cy Fact Sheet for Healthcare Providers: https://pope.com/ This test is not yet approved or cleared by the [place] FDA and has been authorized for detection and/or diagnosis of SARS-CoV-2 by FDA under an Emergency Use Authorization (EUA).  This EUA will remain in effect (meaning this test can be used) for the duration of the COVID-19 declaration under Section 564(b)(1) of the Act, 21 U.S.C. section 360bbb-3(b)(1), unless the authorization is terminated or revoked sooner. Performed at [facility], [address] [place], [place] [address]   MRSA PCR  Screening     Status: None   Collection Time: 02/07/19  3:41 AM  Result Value Ref Range Status   MRSA by PCR NEGATIVE NEGATIVE Final    Comment:        The GeneXpert MRSA Assay (FDA approved for NASAL specimens only), is one component of a comprehensive MRSA colonization surveillance program. It is not intended to diagnose MRSA infection nor to guide or monitor treatment for MRSA infections. Performed at [facility], [address] [place], [place] [address]      Coagulation Studies: No results for input(s): LABPROT, INR in the last 72 hours.  Urinalysis: Recent Labs    02/06/19 2350  COLORURINE AMBER*  LABSPEC 1.020  PHURINE 5.0  GLUCOSEU NEGATIVE  HGBUR SMALL*  BILIRUBINUR NEGATIVE  KETONESUR NEGATIVE  PROTEINUR 100*  NITRITE NEGATIVE  LEUKOCYTESUR LARGE*        Imaging:  No results found.   Assessment & Plan: Pt is a 58 y.o. 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   1.  Acute kidney injury Baseline creatinine of 1.2/GFR 65 from 01/08/2019 noted in care everywhere Vancomycin level of 24.5 noted on the same day Acute kidney injury likely secondary to toxicity from high vancomycin levels. Other contributing conditions include use of high-dose ibuprofen 800 mg 3 times per day,, ACE inhibitor lisinopril 20 mg daily, furosemide 40 mg twice a day Patient is nonoliguric.  Urine output appears to be good as Foley bag is full No acute indication for dialysis Continue to monitor renal function daily  2.  Severe hyperkalemia Likely iatrogenic.  Patient was on potassium supplements as outpatient which likely got worse due to acute kidney injury -Discontinue potassium supplementation Agree with  daily [place] Monitor closely  3.  Insulin-dependent Diabetes with proteinuria, complications of diabetic retinopathy Will obtain urine protein to creatinine ratio Obtain hemoglobin A1c  4.  Metabolic  acidosis Add sodium bicarbonate supplementation in tablet form  5.  Back pain Records are incomplete but patient states he has infection in his back May need repeat imaging to assess current situation.   LOS: 0 Davonne Jarnigan 5/16/202011:16 AM  [facility] [place], [place] [phone]  Note: This note was prepared with Dragon dictation. Any transcription errors are unintentional

## 2019-02-07 NOTE — Progress Notes (Signed)
[employer] - [place] at [facility]   PATIENT NAME: Eugene [name]    MR#:  [id number]  DATE OF BIRTH:  March 06, 1961  SUBJECTIVE:  CHIEF COMPLAINT:   Chief Complaint  Patient presents with  . Acute Renal Failure  . hyperkalemia   No new complaints this morning.  No fevers.  No chest pain.  REVIEW OF SYSTEMS:  Review of Systems  Constitutional: Negative for chills and fever.  HENT: Negative for hearing loss and tinnitus.   Eyes: Negative for blurred vision and double vision.  Respiratory: Negative for cough and shortness of breath.   Cardiovascular: Negative for chest pain.  Gastrointestinal: Negative for abdominal pain, heartburn, nausea and vomiting.  Genitourinary: Negative for dysuria and urgency.  Musculoskeletal: Negative for neck pain.  Skin: Negative for rash.  Neurological: Negative for dizziness and headaches.  Psychiatric/Behavioral: Negative for depression and hallucinations.    DRUG ALLERGIES:  No Known Allergies VITALS:  Blood pressure (!) 153/81, pulse 81, temperature 98.2 F (36.8 C), temperature source Oral, resp. rate 18, weight 106 kg, SpO2 100 %. PHYSICAL EXAMINATION:   Physical Exam  Constitutional: He is oriented to person, place, and time. He appears well-developed.  HENT:  Head: Normocephalic and atraumatic.  Right Ear: External ear normal.  Eyes: Pupils are equal, round, and reactive to light. Conjunctivae are normal. Right eye exhibits no discharge.  Neck: Normal range of motion. Neck supple. No tracheal deviation present.  Cardiovascular: Normal rate, regular rhythm and normal heart sounds.  Respiratory: Effort normal and breath sounds normal. No respiratory distress.  GI: Soft. Bowel sounds are normal. He exhibits no distension.  Musculoskeletal: Normal range of motion.        General: No tenderness.     Comments: Patient status post bilateral BKA  Neurological: He is alert and oriented to person, place, and time. No  cranial nerve deficit.  Skin: Skin is warm. He is not diaphoretic. No erythema.  Psychiatric: He has a normal mood and affect. His behavior is normal.   LABORATORY PANEL:  Male CBC Recent Labs  Lab 02/07/19 0342  WBC 7.5  HGB 8.1*  HCT 25.7*  PLT 255   ------------------------------------------------------------------------------------------------------------------ Chemistries  Recent Labs  Lab 02/06/19 2317 02/07/19 0342 02/07/19 1111  NA 137 137  --   K 7.5* 6.7* 6.7*  CL 114* 116*  --   CO2 14* 15*  --   GLUCOSE 149* 169*  --   BUN 93* 86*  --   CREATININE 2.93* 2.54*  --   CALCIUM 9.1 9.3  --   AST 14*  --   --   ALT 16  --   --   ALKPHOS 56  --   --   BILITOT 0.4  --   --    RADIOLOGY:  No results found. ASSESSMENT AND PLAN:    1. UTI (urinary tract infection)  Catheter associated urinary tract infection.  Patient has chronic indwelling Foley catheter.   Already placed on IV antibiotics with cefepime pending results of urine culture and sensitivities.    2. AKI (acute kidney injury) (HCC) -IV fluids, avoid nephrotoxins and monitor.   Patient apparently was on vancomycin for prior osteomyelitis.   Pharmacist contacting nursing home to determine duration of the vancomycin and when the last dose is of vancomycin currently on hold until dosing and treatment last dose confirmed by pharmacist Will consider ID consult for further recommendations if this information cannot be obtained. Nephrology already consulted.  Follow-up  on input.  3.  Hypokalemia  Potassium level improved from 7.5-6.7. Patient was treated with 1 amp of D50 and 10 units of regular insulin as well as Veltassa.  Follow-up on repeat potassium level later today.  4. Diabetes (HCC) -sliding scale insulin coverage  DVT prophylaxis; heparin  All the records are reviewed and case discussed with Care Management/Social Worker. Management plans discussed with the patient, family and they are in  agreement.  CODE STATUS: Full Code  TOTAL TIME TAKING CARE OF THIS PATIENT: 38 minutes.   More than 50% of the time was spent in counseling/coordination of care: YES  POSSIBLE D/C IN 2 DAYS, DEPENDING ON CLINICAL CONDITION.   Tyesha Joffe M.D on 02/07/2019 at 1:46 PM  Between 7am to 6pm - Pager - [phone]  After 6pm go to [email or web address] - Social research officer, governmentpassword [email or web address]  [facility]  Office  [phone]  CC: Primary care physician; Dimple CaseySmith, Sean A, MD  Note: This dictation was prepared with Dragon dictation along with smaller phrase technology. Any transcriptional errors that result from this process are unintentional.

## 2019-02-08 DIAGNOSIS — G894 Chronic pain syndrome: Secondary | ICD-10-CM | POA: Diagnosis present

## 2019-02-08 DIAGNOSIS — B182 Chronic viral hepatitis C: Secondary | ICD-10-CM | POA: Diagnosis present

## 2019-02-08 DIAGNOSIS — E785 Hyperlipidemia, unspecified: Secondary | ICD-10-CM | POA: Diagnosis present

## 2019-02-08 DIAGNOSIS — K219 Gastro-esophageal reflux disease without esophagitis: Secondary | ICD-10-CM | POA: Diagnosis present

## 2019-02-08 DIAGNOSIS — I739 Peripheral vascular disease, unspecified: Secondary | ICD-10-CM | POA: Diagnosis present

## 2019-02-08 DIAGNOSIS — E1169 Type 2 diabetes mellitus with other specified complication: Secondary | ICD-10-CM | POA: Diagnosis present

## 2019-02-08 DIAGNOSIS — I10 Essential (primary) hypertension: Secondary | ICD-10-CM | POA: Diagnosis present

## 2019-02-08 DIAGNOSIS — F17210 Nicotine dependence, cigarettes, uncomplicated: Secondary | ICD-10-CM | POA: Diagnosis present

## 2019-02-08 DIAGNOSIS — F1910 Other psychoactive substance abuse, uncomplicated: Secondary | ICD-10-CM | POA: Diagnosis present

## 2019-02-08 DIAGNOSIS — R7881 Bacteremia: Secondary | ICD-10-CM | POA: Diagnosis present

## 2019-02-08 DIAGNOSIS — D649 Anemia, unspecified: Secondary | ICD-10-CM | POA: Diagnosis present

## 2019-02-08 DIAGNOSIS — M4624 Osteomyelitis of vertebra, thoracic region: Secondary | ICD-10-CM | POA: Diagnosis present

## 2019-02-08 LAB — BASIC METABOLIC PANEL
Anion gap: 6 (ref 5–15)
BUN: 77 mg/dL — ABNORMAL HIGH (ref 6–20)
CO2: 18 mmol/L — ABNORMAL LOW (ref 22–32)
Calcium: 9.9 mg/dL (ref 8.9–10.3)
Chloride: 114 mmol/L — ABNORMAL HIGH (ref 98–111)
Creatinine, Ser: 1.7 mg/dL — ABNORMAL HIGH (ref 0.61–1.24)
GFR calc Af Amer: 51 mL/min — ABNORMAL LOW (ref >60)
GFR calc non Af Amer: 44 mL/min — ABNORMAL LOW (ref >60)
Glucose, Bld: 153 mg/dL — ABNORMAL HIGH (ref 70–99)
Potassium: 6.5 mmol/L (ref 3.5–5.1)
Sodium: 138 mmol/L (ref 135–145)

## 2019-02-08 LAB — URINE CULTURE: Culture: 30000 — AB

## 2019-02-08 LAB — CBC
HCT: 28.4 % — ABNORMAL LOW (ref 39.0–52.0)
Hemoglobin: 8.8 g/dL — ABNORMAL LOW (ref 13.0–17.0)
MCH: 29 pg (ref 26.0–34.0)
MCHC: 31 g/dL (ref 30.0–36.0)
MCV: 93.7 fL (ref 80.0–100.0)
Platelets: 269 10*3/uL (ref 150–400)
RBC: 3.03 MIL/uL — ABNORMAL LOW (ref 4.22–5.81)
RDW: 14.6 % (ref 11.5–15.5)
WBC: 5.9 10*3/uL (ref 4.0–10.5)
nRBC: 0 % (ref 0.0–0.2)

## 2019-02-08 LAB — HEMOGLOBIN A1C
Hgb A1c MFr Bld: 6.3 % — ABNORMAL HIGH (ref 4.8–5.6)
Mean Plasma Glucose: 134.11 mg/dL

## 2019-02-08 LAB — GLUCOSE, CAPILLARY
Glucose-Capillary: 148 mg/dL — ABNORMAL HIGH (ref 70–99)
Glucose-Capillary: 164 mg/dL — ABNORMAL HIGH (ref 70–99)
Glucose-Capillary: 182 mg/dL — ABNORMAL HIGH (ref 70–99)
Glucose-Capillary: 151 mg/dL — ABNORMAL HIGH (ref 70–99)

## 2019-02-08 LAB — VANCOMYCIN, RANDOM: Vancomycin Rm: 21

## 2019-02-08 LAB — MAGNESIUM: Magnesium: 2.1 mg/dL (ref 1.7–2.4)

## 2019-02-08 MED ORDER — SODIUM ZIRCONIUM CYCLOSILICATE 10 G PO PACK
10.0000 g | PACK | Freq: Two times a day (BID) | ORAL | Status: DC
  Administered 2019-02-08 – 2019-02-09 (×3): 10 g via ORAL
  Filled 2019-02-08 (×4): qty 1

## 2019-02-08 MED ORDER — VANCOMYCIN VARIABLE DOSE PER UNSTABLE RENAL FUNCTION (PHARMACIST DOSING): Status: DC

## 2019-02-08 MED ORDER — SODIUM POLYSTYRENE SULFONATE 15 GM/60ML PO SUSP
30.0000 g | Freq: Once | ORAL | Status: DC
  Filled 2019-02-08: qty 120

## 2019-02-08 NOTE — Consult Note (Signed)
[facility]    Date of Admission:  02/06/2019           Week 8-1/2 vancomycin        Day 3 cefepime       Reason for Consult: Recurrent MRSA bacteremia and thoracic osteomyelitis; possible UTI    Referring Provider: Dr. Jama Flavors   Assessment: He has been on vancomycin for nearly 9 weeks now and probably had some vancomycin induced nephrotoxicity.  I would stop vancomycin now and have his pick removed.  Although he has an abnormal urinalysis and positive urine culture I suspect this represents asymptomatic funguria rather.  He remains afebrile.  I will stop cefepime now.  I recommend that orders be given to a skilled nursing facility to get him back to see his neurosurgeon and general surgeon at [facility].  His discharge summary from [facility] said that Dr. Joselyn Glassman was anticipating follow-up spine MRI.  Please let us (myself or Dr. Rivka Safer) know if we can be of further assistance while he is here.  Plan: 1. Do not restart vancomycin 2. Stop cefepime now 3. Remove PICC line before discharge 4. Follow-up with [facility] providers  Principal Problem:   UTI (urinary tract infection) Active Problems:   AKI (acute kidney injury) (HCC)   Hyperkalemia   MRSA bacteremia   Osteomyelitis of thoracic spine (HCC)   Diabetes (HCC)   Pressure injury of skin   Peripheral vascular disease (HCC)   Hypertension   Dyslipidemia   Chronic pain syndrome   Polysubstance abuse (HCC)   Chronic hepatitis C without hepatic coma (HCC)   GERD (gastroesophageal reflux disease)   Cigarette smoker   Normocytic anemia   Scheduled Meds: . amLODipine  10 mg Oral Daily  . heparin  5,000 Units Subcutaneous Q8H  . hydrALAZINE  50 mg Oral TID  . insulin aspart  0-5 Units Subcutaneous QHS  . insulin aspart  0-9 Units Subcutaneous TID WC  . insulin glargine  20 Units Subcutaneous QHS  . methadone  10 mg Oral BID  . metoprolol succinate  25 mg Oral Daily  . pravastatin  40 mg Oral Daily   . sodium bicarbonate  1,300 mg Oral BID  . sodium polystyrene  30 g Oral Once  . sodium zirconium cyclosilicate  10 g Oral BID  . traZODone  150 mg Oral QHS  . vancomycin variable dose per unstable renal function (pharmacist dosing)   Does not apply See admin instructions   Continuous Infusions: . ceFEPime (MAXIPIME) IV 2 g (02/07/19 2245)   PRN Meds:.acetaminophen **OR** acetaminophen, ondansetron **OR** ondansetron (ZOFRAN) IV, oxyCODONE  HPI: Rhylee Nunn is a 58 y.o. male with diabetes and peripheral vascular disease who was admitted to [facility] on 12/09/2018 with left diabetic foot infection and recurrent MRSA bacteremia.  He was also found to have chronic thoracic osteomyelitis at the T8-9 level with an associated right paraspinous abscess.  He had positive blood cultures for about 1 week before they finally became negative.  There is no evidence of endocarditis by TEE.  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  He was discharged to a skilled nursing facility on 01/08/2019 with a plan to complete 6 weeks of IV vancomycin following first negative blood culture on 01/30/2019.  He was due to follow-up with his neurosurgeon, Dr. Joselyn Glassman, 1 month after discharge.  He was also scheduled to follow-up with his general  surgeon who performed the BKA.  Apparently lab work was being performed at his skilled nursing facility that revealed worsening renal function and hyperkalemia.  His PICC remains in and he has continued on vancomycin.  Recent levels were found to be supratherapeutic upon admission here and vancomycin was held. He has been afebrile. His urinalysis was grossly abnormal with pyuria and hematuria.  He was started on empiric cefepime.  Blood cultures are negative.  Urine culture has grown yeast.   Review of Systems: Review of Systems  Unable to perform ROS: Other  Constitutional:       This is a remote consultation so no review of systems was  obtained.    Past Medical History:  Diagnosis Date  . Diabetes mellitus without complication (HCC)   . Osteomyelitis [facility])     Social History   Tobacco Use  . Smoking status: Current Every Day Smoker    Packs/day: 0.25    Types: Cigarettes  . Smokeless tobacco: Never Used  Substance Use Topics  . Alcohol use: Not Currently  . Drug use: Not on file    History reviewed. No pertinent family history. No Known Allergies  OBJECTIVE: Blood pressure 109/65, pulse 68, temperature 98.4 F (36.9 C), temperature source Oral, resp. rate 16, weight 106 kg, SpO2 98 %.  Physical Exam Constitutional:      Comments: This is a remote consultation so no physical exam was performed.     Lab Results Lab Results  Component Value Date   WBC 5.9 02/08/2019   HGB 8.8 (L) 02/08/2019   HCT 28.4 (L) 02/08/2019   MCV 93.7 02/08/2019   PLT 269 02/08/2019    Lab Results  Component Value Date   CREATININE 1.70 (H) 02/08/2019   BUN 77 (H) 02/08/2019   NA 138 02/08/2019   K 6.5 (HH) 02/08/2019   CL 114 (H) 02/08/2019   CO2 18 (L) 02/08/2019    Lab Results  Component Value Date   ALT 16 02/06/2019   AST 14 (L) 02/06/2019   ALKPHOS 56 02/06/2019   BILITOT 0.4 02/06/2019     Microbiology: Recent Results (from the past 240 hour(s))  SARS Coronavirus 2 (CEPHEID - Performed in [facility] hospital lab), Hosp Order     Status: None   Collection Time: 02/02/19 11:36 AM  Result Value Ref Range Status   SARS Coronavirus 2 NEGATIVE NEGATIVE Final    Comment: (NOTE) If result is NEGATIVE SARS-CoV-2 target nucleic acids are NOT DETECTED. The SARS-CoV-2 RNA is generally detectable in upper and lower  respiratory specimens during the acute phase of infection. The lowest  concentration of SARS-CoV-2 viral copies this assay can detect is 250  copies / mL. A negative result does not preclude SARS-CoV-2 infection  and should not be used as the sole basis for treatment or other  patient management  decisions.  A negative result may occur with  improper specimen collection / handling, submission of specimen other  than nasopharyngeal swab, presence of viral mutation(s) within the  areas targeted by this assay, and inadequate number of viral copies  (<250 copies / mL). A negative result must be combined with clinical  observations, patient history, and epidemiological information. If result is POSITIVE SARS-CoV-2 target nucleic acids are DETECTED. The SARS-CoV-2 RNA is generally detectable in upper and lower  respiratory specimens dur ing the acute phase of infection.  Positive  results are indicative of active infection with SARS-CoV-2.  Clinical  correlation with patient history and other  diagnostic information is  necessary to determine patient infection status.  Positive results do  not rule out bacterial infection or co-infection with other viruses. If result is PRESUMPTIVE POSTIVE SARS-CoV-2 nucleic acids MAY BE PRESENT.   A presumptive positive result was obtained on the submitted specimen  and confirmed on repeat testing.  While 2019 novel coronavirus  (SARS-CoV-2) nucleic acids may be present in the submitted sample  additional confirmatory testing may be necessary for epidemiological  and / or clinical management purposes  to differentiate between  SARS-CoV-2 and other Sarbecovirus currently known to infect humans.  If clinically indicated additional testing with an alternate test  methodology [phone]) is advised. The SARS-CoV-2 RNA is generally  detectable in upper and lower respiratory sp ecimens during the acute  phase of infection. The expected result is Negative. Fact Sheet for Patients:  BoilerBrush.com.cy Fact Sheet for Healthcare Providers: https://pope.com/ This test is not yet approved or cleared by the [place] FDA and has been authorized for detection and/or diagnosis of SARS-CoV-2 by FDA under an  Emergency Use Authorization (EUA).  This EUA will remain in effect (meaning this test can be used) for the duration of the COVID-19 declaration under Section 564(b)(1) of the Act, 21 U.S.C. section 360bbb-3(b)(1), unless the authorization is terminated or revoked sooner. Performed at [facility], [address] [place], [place] [address]   Blood Culture (routine x 2)     Status: None (Preliminary result)   Collection Time: 02/06/19 11:50 PM  Result Value Ref Range Status   Specimen Description BLOOD LEFT UPPER ARM  Final   Special Requests   Final    BOTTLES DRAWN AEROBIC AND ANAEROBIC Blood Culture adequate volume   Culture   Final    NO GROWTH 1 DAY Performed at [facility], [address] [place], [place] [address]    Report Status PENDING  Incomplete  Blood Culture (routine x 2)     Status: None (Preliminary result)   Collection Time: 02/06/19 11:50 PM  Result Value Ref Range Status   Specimen Description BLOOD LEFT HAND  Final   Special Requests   Final    BOTTLES DRAWN AEROBIC AND ANAEROBIC Blood Culture results may not be optimal due to an excessive volume of blood received in culture bottles   Culture   Final    NO GROWTH 1 DAY Performed at [facility], [address], [place], [place] [address]    Report Status PENDING  Incomplete  SARS Coronavirus 2 (CEPHEID - Performed in [facility] hospital lab), Hosp Order     Status: None   Collection Time: 02/06/19 11:50 PM  Result Value Ref Range Status   SARS Coronavirus 2 NEGATIVE NEGATIVE Final    Comment: (NOTE) If result is NEGATIVE SARS-CoV-2 target nucleic acids are NOT DETECTED. The SARS-CoV-2 RNA is generally detectable in upper and lower  respiratory specimens during the acute phase of infection. The lowest  concentration of SARS-CoV-2 viral copies this assay can detect is 250  copies / mL. A negative result does not preclude SARS-CoV-2 infection  and should not be used as the sole  basis for treatment or other  patient management decisions.  A negative result may occur with  improper specimen collection / handling, submission of specimen other  than nasopharyngeal swab, presence of viral mutation(s) within the  areas targeted by this assay, and inadequate number of viral copies  (<250 copies / mL). A negative result must be combined with clinical  observations, patient history, and epidemiological information. If result is POSITIVE SARS-CoV-2 target nucleic acids are DETECTED. The SARS-CoV-2 RNA is generally detectable in upper and lower  respiratory specimens dur ing the acute phase of infection.  Positive  results are indicative of active infection with SARS-CoV-2.  Clinical  correlation with patient history and other diagnostic information is  necessary to determine patient infection status.  Positive results do  not rule out bacterial infection or co-infection with other viruses. If result is PRESUMPTIVE POSTIVE SARS-CoV-2 nucleic acids MAY BE PRESENT.   A presumptive positive result was obtained on the submitted specimen  and confirmed on repeat testing.  While 2019 novel coronavirus  (SARS-CoV-2) nucleic acids may be present in the submitted sample  additional confirmatory testing may be necessary for epidemiological  and / or clinical management purposes  to differentiate between  SARS-CoV-2 and other Sarbecovirus currently known to infect humans.  If clinically indicated additional testing with an alternate test  methodology [phone]) is advised. The SARS-CoV-2 RNA is generally  detectable in upper and lower respiratory sp ecimens during the acute  phase of infection. The expected result is Negative. Fact Sheet for Patients:  BoilerBrush.com.cyhttps://www.fda.gov/media/136312/download Fact Sheet for Healthcare Providers: https://pope.com/https://www.fda.gov/media/136313/download This test is not yet approved or cleared by the [place] FDA and has been authorized for detection  and/or diagnosis of SARS-CoV-2 by FDA under an Emergency Use Authorization (EUA).  This EUA will remain in effect (meaning this test can be used) for the duration of the COVID-19 declaration under Section 564(b)(1) of the Act, 21 U.S.C. section 360bbb-3(b)(1), unless the authorization is terminated or revoked sooner. Performed at [facility], [address] [place], [place] [address]   Urine Culture     Status: Abnormal   Collection Time: 02/06/19 11:50 PM  Result Value Ref Range Status   Specimen Description   Final    URINE, RANDOM Performed at [facility], [address] [place], [place] [address]    Special Requests   Final    NONE Performed at [facility], [address] [place], [place] [address]    Culture 30,000 COLONIES/mL YEAST (A)  Final   Report Status 02/08/2019 FINAL  Final  MRSA PCR Screening     Status: None   Collection Time: 02/07/19  3:41 AM  Result Value Ref Range Status   MRSA by PCR NEGATIVE NEGATIVE Final    Comment:        The GeneXpert MRSA Assay (FDA approved for NASAL specimens only), is one component of a comprehensive MRSA colonization surveillance program. It is not intended to diagnose MRSA infection nor to guide or monitor treatment for MRSA infections. Performed at [facility], [address] [place], [place] [address]     Cliffton AstersJohn Devon Pretty, MD [facility] [phone] pager   [phone] cell 02/08/2019, 10:41 AM

## 2019-02-08 NOTE — Progress Notes (Signed)
Pharmacy Antibiotic Note  Eugene [name] is a 58 y.o. male admitted on 02/06/2019 with osteomyelitis.  Pharmacy has been consulted for Vancomycin dosing.  Plan: Pt was on Vancomycin 1250 mg IV Q18H PTA , last dose was on 5/15 @ 2037.   Random vanc levels have been ordered to determine when to restart vancomycin.  Vanc levels :  5/15 @ 2300 = 35 5/16 @ 1630 = 22 5/17 @ 0000 = 21  Most recent random levels show vanc only dropped 1 mcg/mL in 6 hrs; this is surprising considering renal function improved over past 24 hrs (SrCr went from 2.54 to 1.7).  Will hold additional vanc doses for now and draw another random vanc in 12 hrs on 5/17 @ 1200.   Weight: 233 lb 11 oz (106 kg)  Temp (24hrs), Avg:98.1 F (36.7 C), Min:98 F (36.7 C), Max:98.1 F (36.7 C)  Recent Labs  Lab 02/06/19 2317 02/06/19 2350 02/07/19 0342 02/07/19 1630 02/08/19 0003  WBC 7.5  --  7.5  --  5.9  CREATININE 2.93*  --  2.54*  --  1.70*  LATICACIDVEN  --  0.6  --   --   --   VANCORANDOM 35  --   --  22 21    Estimated Creatinine Clearance: 61.8 mL/min (A) (by C-G formula based on SCr of 1.7 mg/dL (H)).    No Known Allergies  Antimicrobials this admission:   >>    >>  Dose adjustments this admission:   Microbiology results:  BCx:   UCx:    Sputum:    MRSA PCR:   Thank you for allowing pharmacy to be a part of this patient's care.  Tamiki Kuba D 02/08/2019 2:11 AM

## 2019-02-08 NOTE — Progress Notes (Signed)
[facility], [place] 02/08/19  Subjective:   LOS: 1 05/16 0701 - 05/17 0700 In: 1250 [P.O.:1200; IV Piggyback:50] Out: 2200 [Urine:2200] Doing well.  Able to eat without nausea or vomiting Urine output greater than 2000 cc Serum creatinine has improved Serum potassium remains elevated  Objective:  Vital signs in last 24 hours:  Temp:  [98 F (36.7 C)-98.4 F (36.9 C)] 98.4 F (36.9 C) (05/17 0750) Pulse Rate:  [64-68] 68 (05/17 0750) Resp:  [16-19] 16 (05/17 0750) BP: (108-121)/(59-68) 109/65 (05/17 0750) SpO2:  [98 %-100 %] 98 % (05/17 0750)  Weight change:  Filed Weights   02/06/19 2326  Weight: 106 kg    Intake/Output:    Intake/Output Summary (Last 24 hours) at 02/08/2019 1610 Last data filed at 02/08/2019 0600 Gross per 24 hour  Intake 1250 ml  Output 2200 ml  Net -950 ml   Physical Exam: General:  Chronically ill-appearing gentleman, laying in the bed  HEENT  anicteric, moist oral mucous membranes  Neck:  Supple, no masses  Lungs:  Normal breathing effort, clear to auscultation  Heart::  Regular rhythm, soft systolic murmur  Abdomen:  Soft, nontender, nondistended  Extremities:  Bilateral below the knee amputation, no edema  Neurologic:  Alert, able to answer questions  Skin:  No acute rashes  Access:  Right IJ Hickman  Foley:  Catheter in place     Basic Metabolic Panel:  Recent Labs  Lab 02/06/19 2317 02/07/19 0342 02/07/19 1111 02/07/19 1830 02/08/19 0003  NA 137 137  --   --  138  K 7.5* 6.7* 6.7* 6.5* 6.5*  CL 114* 116*  --   --  114*  CO2 14* 15*  --   --  18*  GLUCOSE 149* 169*  --   --  153*  BUN 93* 86*  --   --  77*  CREATININE 2.93* 2.54*  --   --  1.70*  CALCIUM 9.1 9.3  --   --  9.9  MG  --   --   --   --  2.1     CBC: Recent Labs  Lab 02/06/19 2317 02/07/19 0342 02/08/19 0003  WBC 7.5 7.5 5.9  NEUTROABS 5.2  --   --   HGB 8.4* 8.1* 8.8*  HCT 27.5* 25.7* 28.4*  MCV 92.9 93.8 93.7  PLT  279 255 269     No results found for: HEPBSAG, HEPBSAB, HEPBIGM    Microbiology:  Recent Results (from the past 240 hour(s))  SARS Coronavirus 2 (CEPHEID - Performed in [facility] hospital lab), Hosp Order     Status: None   Collection Time: 02/02/19 11:36 AM  Result Value Ref Range Status   SARS Coronavirus 2 NEGATIVE NEGATIVE Final    Comment: (NOTE) If result is NEGATIVE SARS-CoV-2 target nucleic acids are NOT DETECTED. The SARS-CoV-2 RNA is generally detectable in upper and lower  respiratory specimens during the acute phase of infection. The lowest  concentration of SARS-CoV-2 viral copies this assay can detect is 250  copies / mL. A negative result does not preclude SARS-CoV-2 infection  and should not be used as the sole basis for treatment or other  patient management decisions.  A negative result may occur with  improper specimen collection / handling, submission of specimen other  than nasopharyngeal swab, presence of viral mutation(s) within the  areas targeted by this assay, and inadequate number of viral copies  (<250 copies / mL). A negative result  must be combined with clinical  observations, patient history, and epidemiological information. If result is POSITIVE SARS-CoV-2 target nucleic acids are DETECTED. The SARS-CoV-2 RNA is generally detectable in upper and lower  respiratory specimens dur ing the acute phase of infection.  Positive  results are indicative of active infection with SARS-CoV-2.  Clinical  correlation with patient history and other diagnostic information is  necessary to determine patient infection status.  Positive results do  not rule out bacterial infection or co-infection with other viruses. If result is PRESUMPTIVE POSTIVE SARS-CoV-2 nucleic acids MAY BE PRESENT.   A presumptive positive result was obtained on the submitted specimen  and confirmed on repeat testing.  While 2019 novel coronavirus  (SARS-CoV-2) nucleic acids may be  present in the submitted sample  additional confirmatory testing may be necessary for epidemiological  and / or clinical management purposes  to differentiate between  SARS-CoV-2 and other Sarbecovirus currently known to infect humans.  If clinically indicated additional testing with an alternate test  methodology [phone] is advised. The SARS-CoV-2 RNA is generally  detectable in upper and lower respiratory sp ecimens during the acute  phase of infection. The expected result is Negative. Fact Sheet for Patients:  BoilerBrush.com.cyhttps://www.fda.gov/media/136312/download Fact Sheet for Healthcare Providers: https://pope.com/https://www.fda.gov/media/136313/download This test is not yet approved or cleared by the [place] FDA and has been authorized for detection and/or diagnosis of SARS-CoV-2 by FDA under an Emergency Use Authorization (EUA).  This EUA will remain in effect (meaning this test can be used) for the duration of the COVID-19 declaration under Section 564(b)(1) of the Act, 21 U.S.C. section 360bbb-3(b)(1), unless the authorization is terminated or revoked sooner. Performed at [facility], [address] [place], [place] [address]   Blood Culture (routine x 2)     Status: None (Preliminary result)   Collection Time: 02/06/19 11:50 PM  Result Value Ref Range Status   Specimen Description BLOOD LEFT UPPER ARM  Final   Special Requests   Final    BOTTLES DRAWN AEROBIC AND ANAEROBIC Blood Culture adequate volume   Culture   Final    NO GROWTH 1 DAY Performed at [facility], [address] [place], [place] [address]    Report Status PENDING  Incomplete  Blood Culture (routine x 2)     Status: None (Preliminary result)   Collection Time: 02/06/19 11:50 PM  Result Value Ref Range Status   Specimen Description BLOOD LEFT HAND  Final   Special Requests   Final    BOTTLES DRAWN AEROBIC AND ANAEROBIC Blood Culture results may not be optimal due to an excessive volume of blood  received in culture bottles   Culture   Final    NO GROWTH 1 DAY Performed at [facility], [address] [place], [place] [address]    Report Status PENDING  Incomplete  SARS Coronavirus 2 (CEPHEID - Performed in [facility] hospital lab), Hosp Order     Status: None   Collection Time: 02/06/19 11:50 PM  Result Value Ref Range Status   SARS Coronavirus 2 NEGATIVE NEGATIVE Final    Comment: (NOTE) If result is NEGATIVE SARS-CoV-2 target nucleic acids are NOT DETECTED. The SARS-CoV-2 RNA is generally detectable in upper and lower  respiratory specimens during the acute phase of infection. The lowest  concentration of SARS-CoV-2 viral copies this assay can detect is 250  copies / mL. A negative result does not preclude SARS-CoV-2 infection  and should not be used as the  sole basis for treatment or other  patient management decisions.  A negative result may occur with  improper specimen collection / handling, submission of specimen other  than nasopharyngeal swab, presence of viral mutation(s) within the  areas targeted by this assay, and inadequate number of viral copies  (<250 copies / mL). A negative result must be combined with clinical  observations, patient history, and epidemiological information. If result is POSITIVE SARS-CoV-2 target nucleic acids are DETECTED. The SARS-CoV-2 RNA is generally detectable in upper and lower  respiratory specimens dur ing the acute phase of infection.  Positive  results are indicative of active infection with SARS-CoV-2.  Clinical  correlation with patient history and other diagnostic information is  necessary to determine patient infection status.  Positive results do  not rule out bacterial infection or co-infection with other viruses. If result is PRESUMPTIVE POSTIVE SARS-CoV-2 nucleic acids MAY BE PRESENT.   A presumptive positive result was obtained on the submitted specimen  and confirmed on repeat testing.  While 2019 novel  coronavirus  (SARS-CoV-2) nucleic acids may be present in the submitted sample  additional confirmatory testing may be necessary for epidemiological  and / or clinical management purposes  to differentiate between  SARS-CoV-2 and other Sarbecovirus currently known to infect humans.  If clinically indicated additional testing with an alternate test  methodology [phone]) is advised. The SARS-CoV-2 RNA is generally  detectable in upper and lower respiratory sp ecimens during the acute  phase of infection. The expected result is Negative. Fact Sheet for Patients:  BoilerBrush.com.cy Fact Sheet for Healthcare Providers: https://pope.com/ This test is not yet approved or cleared by the [place] FDA and has been authorized for detection and/or diagnosis of SARS-CoV-2 by FDA under an Emergency Use Authorization (EUA).  This EUA will remain in effect (meaning this test can be used) for the duration of the COVID-19 declaration under Section 564(b)(1) of the Act, 21 U.S.C. section 360bbb-3(b)(1), unless the authorization is terminated or revoked sooner. Performed at [facility], [address] [place], [place] [address]   MRSA PCR Screening     Status: None   Collection Time: 02/07/19  3:41 AM  Result Value Ref Range Status   MRSA by PCR NEGATIVE NEGATIVE Final    Comment:        The GeneXpert MRSA Assay (FDA approved for NASAL specimens only), is one component of a comprehensive MRSA colonization surveillance program. It is not intended to diagnose MRSA infection nor to guide or monitor treatment for MRSA infections. Performed at [facility], [address] [place], [place] [address]     Coagulation Studies: No results for input(s): LABPROT, INR in the last 72 hours.  Urinalysis: Recent Labs    02/06/19 2350  COLORURINE AMBER*  LABSPEC 1.020  PHURINE 5.0  GLUCOSEU NEGATIVE  HGBUR SMALL*   BILIRUBINUR NEGATIVE  KETONESUR NEGATIVE  PROTEINUR 100*  NITRITE NEGATIVE  LEUKOCYTESUR LARGE*      Imaging: No results found.   Medications:   . ceFEPime (MAXIPIME) IV 2 g (02/07/19 2245)   . amLODipine  10 mg Oral Daily  . heparin  5,000 Units Subcutaneous Q8H  . hydrALAZINE  50 mg Oral TID  . insulin aspart  0-5 Units Subcutaneous QHS  . insulin aspart  0-9 Units Subcutaneous TID WC  . insulin glargine  20 Units Subcutaneous QHS  . methadone  10 mg Oral BID  . metoprolol succinate  25 mg Oral Daily  . pravastatin  40 mg Oral Daily  . sodium bicarbonate  1,300 mg Oral BID  . sodium zirconium cyclosilicate  10 g Oral BID  . traZODone  150 mg Oral QHS  . vancomycin variable dose per unstable renal function (pharmacist dosing)   Does not apply See admin instructions   acetaminophen **OR** acetaminophen, ondansetron **OR** ondansetron (ZOFRAN) IV, oxyCODONE  Assessment/ Plan:  57 y.o. male with   Principal Problem:   UTI (urinary tract infection) Active Problems:   AKI (acute kidney injury) (HCC)   Hyperkalemia   Diabetes (HCC)   Pressure injury of skin   #.  Acute kidney injury Baseline creatinine of 1.2/GFR 65 from 01/08/2019 noted in care everywhere Vancomycin level of 24.5 noted on the same day Acute kidney injury likely secondary to toxicity from high vancomycin levels. Other contributing conditions include use of high-dose ibuprofen 800 mg 3 times per day,, ACE inhibitor lisinopril 20 mg daily, furosemide 40 mg twice a day Patient is nonoliguric.  Urine output appears to be good as Foley bag is full No acute indication for dialysis Patient reports that he has chronic Foley due to urinary retention Recent Labs    02/06/19 2317 02/07/19 0342 02/08/19 0003  CREATININE 2.93* 2.54* 1.70*    #. Anemia of CKD  Lab Results  Component Value Date   HGB 8.8 (L) 02/08/2019  Continue to monitor closely   #.  Insulin-dependent diabetes with proteinuria,  diabetic retinopathy Hgb A1c MFr Bld (%)  Date Value  02/08/2019 6.3 (H)  Urine protein to creatinine ratio 1.73 g Avoid ACE inhibitor or ARB at this time due to hyperkalemia   # Severe hyperkalemia Likely iatrogenic.  Patient was on potassium supplements as outpatient which likely got worse due to acute kidney injury -Discontinue potassium supplementation Daily Lokelma Monitor closely   LOS: 1 Eugene [name] 5/17/20209:28 AM  [facility] [place], [place] [phone]

## 2019-02-08 NOTE — Progress Notes (Addendum)
[facility] -  at [facility]   PATIENT NAME: Eugene [name]    MR#:  [id number]  DATE OF BIRTH:  [birth year]  SUBJECTIVE:  CHIEF COMPLAINT:   Chief Complaint  Patient presents with  . Acute Renal Failure  . hyperkalemia   No new complaints this morning.  No fevers.  No chest pain. Patient reports better control of his chronic pains including his back pain since his methadone was resumed.  REVIEW OF SYSTEMS:  Review of Systems  Constitutional: Negative for chills and fever.  HENT: Negative for hearing loss and tinnitus.   Eyes: Negative for blurred vision and double vision.  Respiratory: Negative for cough and shortness of breath.   Cardiovascular: Negative for chest pain.  Gastrointestinal: Negative for abdominal pain, heartburn, nausea and vomiting.  Genitourinary: Negative for dysuria and urgency.  Musculoskeletal: Negative for neck pain.  Skin: Negative for rash.  Neurological: Negative for dizziness and headaches.  Psychiatric/Behavioral: Negative for depression and hallucinations.    DRUG ALLERGIES:  No Known Allergies VITALS:  Blood pressure 109/65, pulse 68, temperature 98.4 F (36.9 C), temperature source Oral, resp. rate 16, weight 106 kg, SpO2 98 %. PHYSICAL EXAMINATION:   Physical Exam  Constitutional: He is oriented to person, place, and time. He appears well-developed.  HENT:  Head: Normocephalic and atraumatic.  Right Ear: External ear normal.  Eyes: Pupils are equal, round, and reactive to light. Conjunctivae are normal. Right eye exhibits no discharge.  Neck: Normal range of motion. Neck supple. No tracheal deviation present.  Cardiovascular: Normal rate, regular rhythm and normal heart sounds.  Respiratory: Effort normal and breath sounds normal. No respiratory distress.  GI: Soft. Bowel sounds are normal. He exhibits no distension.  Musculoskeletal: Normal range of motion.        General: No tenderness.     Comments:  Patient status post bilateral BKA  Neurological: He is alert and oriented to person, place, and time. No cranial nerve deficit.  Skin: Skin is warm. He is not diaphoretic. No erythema.  Psychiatric: He has a normal mood and affect. His behavior is normal.   LABORATORY PANEL:  Male CBC Recent Labs  Lab 02/08/19 0003  WBC 5.9  HGB 8.8*  HCT 28.4*  PLT 269   ------------------------------------------------------------------------------------------------------------------ Chemistries  Recent Labs  Lab 02/06/19 2317  02/08/19 0003  NA 137   < > 138  K 7.5*   < > 6.5*  CL 114*   < > 114*  CO2 14*   < > 18*  GLUCOSE 149*   < > 153*  BUN 93*   < > 77*  CREATININE 2.93*   < > 1.70*  CALCIUM 9.1   < > 9.9  MG  --   --  2.1  AST 14*  --   --   ALT 16  --   --   ALKPHOS 56  --   --   BILITOT 0.4  --   --    < > = values in this interval not displayed.   RADIOLOGY:  No results found. ASSESSMENT AND PLAN:    1. UTI (urinary tract infection)  Catheter associated urinary tract infection.  Patient has chronic indwelling Foley catheter.   Patient was initially placed on IV antibiotics with cefepime.  This morning I discussed case with infectious disease specialist on-call Dr. Orvan Falconer who confirmed this is most likely asymptomatic funguria after reviewing urine culture and discontinued cefepime.  2. AKI (acute kidney injury) (HCC) -  IV fluids, avoid nephrotoxins and monitor.   Patient apparently was on vancomycin for nearly 9 weeks for prior osteomyelitis.   Nephrology following. Likely contributed by vancomycin toxicity.  Infectious disease specialist Dr. Orvan Falconerampbell input appreciated.  Vancomycin discontinued.  3.  Hyperkalemia  Potassium level improved from 7.5-6.5 I discussed with nephrologist this morning and patient placed on Lokelma  4. Diabetes (HCC) -sliding scale insulin coverage  5. 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  [facility]  He had positive blood cultures for about 1 week before they finally became negative.  There is no evidence of endocarditis by TEE.  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  He was discharged to a skilled nursing facility on 01/08/2019 with a plan to complete 6 weeks of IV vancomycin following first negative blood culture on 01/30/2019.  He was due to follow-up with his neurosurgeon, Dr. Joselyn Glassmanyler, 1 month after [facility]  He was also scheduled to follow-up with his general surgeon who performed the BKA. Appreciate input from infectious disease specialist on-call Dr. Orvan Falconerampbell.  Vancomycin and cefepime discontinued. Plan is to remove PICC line prior to discharge from the hospital.  [facility].  DVT prophylaxis; heparin  All the records are reviewed and case discussed with Care Management/Social Worker. Management plans discussed with the patient, family and they are in agreement.  CODE STATUS: Full Code  TOTAL TIME TAKING CARE OF THIS PATIENT: 37 minutes.   More than 50% of the time was spent in counseling/coordination of care: YES  POSSIBLE D/C IN 2 DAYS, DEPENDING ON CLINICAL CONDITION.   Tammye Kahler M.D on 02/08/2019 at 12:26 PM  Between 7am to 6pm - Pager - [phone]  After 6pm go to [email or web address] - Social research officer, governmentpassword [email or web address]  [facility] [place] [facility]  Office  [phone]  CC: Primary care physician; Dimple CaseySmith, Sean A, MD  Note: This dictation was prepared with Dragon dictation along with smaller phrase technology. Any transcriptional errors that result from this process are unintentional.

## 2019-02-09 ENCOUNTER — Inpatient Hospital Stay: Payer: Medicaid Other

## 2019-02-09 LAB — CBC
HCT: 26.1 % — ABNORMAL LOW (ref 39.0–52.0)
Hemoglobin: 8.3 g/dL — ABNORMAL LOW (ref 13.0–17.0)
MCH: 29.1 pg (ref 26.0–34.0)
MCHC: 31.8 g/dL (ref 30.0–36.0)
MCV: 91.6 fL (ref 80.0–100.0)
Platelets: 243 10*3/uL (ref 150–400)
RBC: 2.85 MIL/uL — ABNORMAL LOW (ref 4.22–5.81)
RDW: 13.9 % (ref 11.5–15.5)
WBC: 6.8 10*3/uL (ref 4.0–10.5)
nRBC: 0 % (ref 0.0–0.2)

## 2019-02-09 LAB — BASIC METABOLIC PANEL
Anion gap: 7 (ref 5–15)
BUN: 58 mg/dL — ABNORMAL HIGH (ref 6–20)
CO2: 15 mmol/L — ABNORMAL LOW (ref 22–32)
Calcium: 9.9 mg/dL (ref 8.9–10.3)
Chloride: 114 mmol/L — ABNORMAL HIGH (ref 98–111)
Creatinine, Ser: 1.17 mg/dL (ref 0.61–1.24)
GFR calc Af Amer: >60 mL/min (ref >60)
GFR calc non Af Amer: >60 mL/min (ref >60)
Glucose, Bld: 176 mg/dL — ABNORMAL HIGH (ref 70–99)
Potassium: 4.9 mmol/L (ref 3.5–5.1)
Sodium: 136 mmol/L (ref 135–145)

## 2019-02-09 LAB — GLUCOSE, CAPILLARY
Glucose-Capillary: 177 mg/dL — ABNORMAL HIGH (ref 70–99)
Glucose-Capillary: 167 mg/dL — ABNORMAL HIGH (ref 70–99)
Glucose-Capillary: 170 mg/dL — ABNORMAL HIGH (ref 70–99)
Glucose-Capillary: 217 mg/dL — ABNORMAL HIGH (ref 70–99)

## 2019-02-09 LAB — MAGNESIUM: Magnesium: 1.8 mg/dL (ref 1.7–2.4)

## 2019-02-09 MED ORDER — MORPHINE SULFATE (PF) 2 MG/ML IV SOLN
2.0000 mg | Freq: Once | INTRAVENOUS | Status: AC
  Administered 2019-02-09: 09:00:00 2 mg via INTRAVENOUS
  Filled 2019-02-09: qty 1

## 2019-02-09 MED ORDER — SODIUM ZIRCONIUM CYCLOSILICATE 10 G PO PACK
10.0000 g | PACK | Freq: Every day | ORAL | Status: DC
  Administered 2019-02-10: 10 g via ORAL
  Filled 2019-02-09: qty 1

## 2019-02-09 MED ORDER — BISACODYL 10 MG RE SUPP: 10.0000 mg | Freq: Every day | RECTAL | Status: DC | PRN

## 2019-02-09 NOTE — TOC Initial Note (Signed)
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 FL2 and PASSR completed   Expected Discharge Plan: Long Term Nursing Home Barriers to Discharge: Continued Medical Work up   Patient Goals and CMS Choice        Expected Discharge Plan and Services Expected Discharge Plan: Long Term Nursing Home       Living arrangements for the past 2 months: Skilled Nursing Facility                                      Prior Living Arrangements/Services Living arrangements for the past 2 months: Skilled Nursing Facility Lives with:: Facility Resident Patient language and need for interpreter reviewed:: No Do you feel safe going back to the place where you live?: Yes      Need for Family Participation in Patient Care: No (Comment) Care giver support system in place?: Yes (comment)   Criminal Activity/Legal Involvement Pertinent to Current Situation/Hospitalization: No - Comment as needed  Activities of Daily Living Home Assistive Devices/Equipment: None ADL Screening (condition at time of admission) Patient's cognitive ability adequate to safely complete daily activities?: Yes Is the patient deaf or have difficulty hearing?: No Does the patient have difficulty seeing, even when wearing glasses/contacts?: No Does the patient have difficulty concentrating, remembering, or making decisions?: No Patient able to express need for assistance with ADLs?: Yes Does the patient have difficulty dressing or  bathing?: Yes Independently performs ADLs?: No Communication: Independent Dressing (OT): Needs assistance Is this a change from baseline?: Pre-admission baseline Grooming: Independent Is this a change from baseline?: Pre-admission baseline Feeding: Independent Bathing: Needs assistance Is this a change from baseline?: Pre-admission baseline Toileting: Needs assistance Is this a change from baseline?: Pre-admission baseline In/Out Bed: Dependent Is this a change from baseline?: Pre-admission baseline Walks in Home: (BKA) Does the patient have difficulty walking or climbing stairs?: Yes Weakness of Legs: Both Weakness of Arms/Hands: Both  Permission Sought/Granted Permission sought to share information with : [name] granted to share information with : Yes, Verbal Permission Granted              Emotional Assessment Appearance:: Appears stated age Attitude/Demeanor/Rapport: Unable to Assess   Orientation: : Oriented to Self, Oriented to Place, Oriented to  Time, Oriented to Situation Alcohol / Substance Use: Tobacco Use Psych Involvement: No (comment)  Admission diagnosis:  Hyperkalemia [E87.5] Acute renal failure, unspecified acute renal failure type [facility]) [N17.9] Patient Active Problem List   Diagnosis Date Noted  . MRSA bacteremia 02/08/2019  . Osteomyelitis of thoracic spine (HCC) 02/08/2019  . Peripheral vascular disease (HCC) 02/08/2019  . Hypertension 02/08/2019  . Dyslipidemia 02/08/2019  . Chronic pain syndrome 02/08/2019  . Polysubstance abuse (HCC) 02/08/2019  . Chronic hepatitis C without hepatic coma (HCC) 02/08/2019  . GERD (gastroesophageal reflux disease) 02/08/2019  . Cigarette smoker  02/08/2019  . Normocytic anemia 02/08/2019  . UTI (urinary tract infection) 02/07/2019  . AKI (acute kidney injury) (HCC) 02/07/2019  . Hyperkalemia 02/07/2019  . Diabetes (HCC) 02/07/2019  . Pressure injury of skin 02/07/2019    PCP:  Dimple CaseySmith, Sean A, MD Pharmacy:  No Pharmacies Listed    Social Determinants of Health (SDOH) Interventions    Readmission Risk Interventions No flowsheet data found.

## 2019-02-09 NOTE — Progress Notes (Signed)
[facility] [place], [place] 02/09/19  Subjective:  Overall it appears that the patient's kidney function is improving. Resting comfortably in bed at the moment.    Objective:  Vital signs in last 24 hours:  Temp:  [98.3 F (36.8 C)-98.8 F (37.1 C)] 98.4 F (36.9 C) (05/18 0437) Pulse Rate:  [62-76] 76 (05/18 0437) Resp:  [18] 18 (05/18 0437) BP: (113-138)/(62-79) 138/79 (05/18 0437) SpO2:  [89 %-100 %] 89 % (05/18 0437)  Weight change:  Filed Weights   02/06/19 2326  Weight: 106 kg    Intake/Output:    Intake/Output Summary (Last 24 hours) at 02/09/2019 1138 Last data filed at 02/09/2019 0439 Gross per 24 hour  Intake 720 ml  Output 1875 ml  Net -1155 ml   Physical Exam: General:  Chronically ill-appearing gentleman, laying in the bed  HEENT  anicteric, moist oral mucous membranes  Neck:  Supple, no masses  Lungs:  Normal breathing effort, clear to auscultation  Heart::  Regular rhythm, soft systolic murmur  Abdomen:  Soft, nontender, nondistended  Extremities:  Bilateral below the knee amputation, no edema  Neurologic:  Alert, able to answer questions  Skin:  No acute rashes  Access:  Removed  Foley:  Catheter in place     Basic Metabolic Panel:  Recent Labs  Lab 02/06/19 2317 02/07/19 0342 02/07/19 1111 02/07/19 1830 02/08/19 0003 02/09/19 0638  NA 137 137  --   --  138 136  K 7.5* 6.7* 6.7* 6.5* 6.5* 4.9  CL 114* 116*  --   --  114* 114*  CO2 14* 15*  --   --  18* 15*  GLUCOSE 149* 169*  --   --  153* 176*  BUN 93* 86*  --   --  77* 58*  CREATININE 2.93* 2.54*  --   --  1.70* 1.17  CALCIUM 9.1 9.3  --   --  9.9 9.9  MG  --   --   --   --  2.1 1.8     CBC: Recent Labs  Lab 02/06/19 2317 02/07/19 0342 02/08/19 0003 02/09/19 0638  WBC 7.5 7.5 5.9 6.8  NEUTROABS 5.2  --   --   --   HGB 8.4* 8.1* 8.8* 8.3*  HCT 27.5* 25.7* 28.4* 26.1*  MCV 92.9 93.8 93.7 91.6  PLT 279 255 269 243     No results found for:  HEPBSAG, HEPBSAB, HEPBIGM    Microbiology:  Recent Results (from the past 240 hour(s))  SARS Coronavirus 2 (CEPHEID - Performed in [facility] hospital lab), Hosp Order     Status: None   Collection Time: 02/02/19 11:36 AM  Result Value Ref Range Status   SARS Coronavirus 2 NEGATIVE NEGATIVE Final    Comment: (NOTE) If result is NEGATIVE SARS-CoV-2 target nucleic acids are NOT DETECTED. The SARS-CoV-2 RNA is generally detectable in upper and lower  respiratory specimens during the acute phase of infection. The lowest  concentration of SARS-CoV-2 viral copies this assay can detect is 250  copies / mL. A negative result does not preclude SARS-CoV-2 infection  and should not be used as the sole basis for treatment or other  patient management decisions.  A negative result may occur with  improper specimen collection / handling, submission of specimen other  than nasopharyngeal swab, presence of viral mutation(s) within the  areas targeted by this assay, and inadequate number of viral copies  (<250 copies / mL). A negative result must be  combined with clinical  observations, patient history, and epidemiological information. If result is POSITIVE SARS-CoV-2 target nucleic acids are DETECTED. The SARS-CoV-2 RNA is generally detectable in upper and lower  respiratory specimens dur ing the acute phase of infection.  Positive  results are indicative of active infection with SARS-CoV-2.  Clinical  correlation with patient history and other diagnostic information is  necessary to determine patient infection status.  Positive results do  not rule out bacterial infection or co-infection with other viruses. If result is PRESUMPTIVE POSTIVE SARS-CoV-2 nucleic acids MAY BE PRESENT.   A presumptive positive result was obtained on the submitted specimen  and confirmed on repeat testing.  While 2019 novel coronavirus  (SARS-CoV-2) nucleic acids may be present in the submitted sample  additional  confirmatory testing may be necessary for epidemiological  and / or clinical management purposes  to differentiate between  SARS-CoV-2 and other Sarbecovirus currently known to infect humans.  If clinically indicated additional testing with an alternate test  methodology [phone]) is advised. The SARS-CoV-2 RNA is generally  detectable in upper and lower respiratory sp ecimens during the acute  phase of infection. The expected result is Negative. Fact Sheet for Patients:  BoilerBrush.com.cy Fact Sheet for Healthcare Providers: https://pope.com/ This test is not yet approved or cleared by the [place] FDA and has been authorized for detection and/or diagnosis of SARS-CoV-2 by FDA under an Emergency Use Authorization (EUA).  This EUA will remain in effect (meaning this test can be used) for the duration of the COVID-19 declaration under Section 564(b)(1) of the Act, 21 U.S.C. section 360bbb-3(b)(1), unless the authorization is terminated or revoked sooner. Performed at [facility], [address] [place], [place] [address]   Blood Culture (routine x 2)     Status: None (Preliminary result)   Collection Time: 02/06/19 11:50 PM  Result Value Ref Range Status   Specimen Description BLOOD LEFT UPPER ARM  Final   Special Requests   Final    BOTTLES DRAWN AEROBIC AND ANAEROBIC Blood Culture adequate volume   Culture   Final    NO GROWTH 2 DAYS Performed at [facility], [address], [place], [place] [address]    Report Status PENDING  Incomplete  Blood Culture (routine x 2)     Status: None (Preliminary result)   Collection Time: 02/06/19 11:50 PM  Result Value Ref Range Status   Specimen Description BLOOD LEFT HAND  Final   Special Requests   Final    BOTTLES DRAWN AEROBIC AND ANAEROBIC Blood Culture results may not be optimal due to an excessive volume of blood received in culture bottles   Culture   Final     NO GROWTH 2 DAYS Performed at [facility], [address] [place], [place] [address]    Report Status PENDING  Incomplete  SARS Coronavirus 2 (CEPHEID - Performed in [facility] hospital lab), Hosp Order     Status: None   Collection Time: 02/06/19 11:50 PM  Result Value Ref Range Status   SARS Coronavirus 2 NEGATIVE NEGATIVE Final    Comment: (NOTE) If result is NEGATIVE SARS-CoV-2 target nucleic acids are NOT DETECTED. The SARS-CoV-2 RNA is generally detectable in upper and lower  respiratory specimens during the acute phase of infection. The lowest  concentration of SARS-CoV-2 viral copies this assay can detect is 250  copies / mL. A negative result does not preclude SARS-CoV-2 infection  and should not be used as the sole basis  for treatment or other  patient management decisions.  A negative result may occur with  improper specimen collection / handling, submission of specimen other  than nasopharyngeal swab, presence of viral mutation(s) within the  areas targeted by this assay, and inadequate number of viral copies  (<250 copies / mL). A negative result must be combined with clinical  observations, patient history, and epidemiological information. If result is POSITIVE SARS-CoV-2 target nucleic acids are DETECTED. The SARS-CoV-2 RNA is generally detectable in upper and lower  respiratory specimens dur ing the acute phase of infection.  Positive  results are indicative of active infection with SARS-CoV-2.  Clinical  correlation with patient history and other diagnostic information is  necessary to determine patient infection status.  Positive results do  not rule out bacterial infection or co-infection with other viruses. If result is PRESUMPTIVE POSTIVE SARS-CoV-2 nucleic acids MAY BE PRESENT.   A presumptive positive result was obtained on the submitted specimen  and confirmed on repeat testing.  While 2019 novel coronavirus  (SARS-CoV-2) nucleic acids may  be present in the submitted sample  additional confirmatory testing may be necessary for epidemiological  and / or clinical management purposes  to differentiate between  SARS-CoV-2 and other Sarbecovirus currently known to infect humans.  If clinically indicated additional testing with an alternate test  methodology [phone]) is advised. The SARS-CoV-2 RNA is generally  detectable in upper and lower respiratory sp ecimens during the acute  phase of infection. The expected result is Negative. Fact Sheet for Patients:  BoilerBrush.com.cy Fact Sheet for Healthcare Providers: https://pope.com/ This test is not yet approved or cleared by the [place] FDA and has been authorized for detection and/or diagnosis of SARS-CoV-2 by FDA under an Emergency Use Authorization (EUA).  This EUA will remain in effect (meaning this test can be used) for the duration of the COVID-19 declaration under Section 564(b)(1) of the Act, 21 U.S.C. section 360bbb-3(b)(1), unless the authorization is terminated or revoked sooner. Performed at [facility], [address] [place], [place] [address]   Urine Culture     Status: Abnormal   Collection Time: 02/06/19 11:50 PM  Result Value Ref Range Status   Specimen Description   Final    URINE, RANDOM Performed at [facility], [address] [place], [place] [address]    Special Requests   Final    NONE Performed at [facility], [address] [place], [place] [address]    Culture 30,000 COLONIES/mL YEAST (A)  Final   Report Status 02/08/2019 FINAL  Final  MRSA PCR Screening     Status: None   Collection Time: 02/07/19  3:41 AM  Result Value Ref Range Status   MRSA by PCR NEGATIVE NEGATIVE Final    Comment:        The GeneXpert MRSA Assay (FDA approved for NASAL specimens only), is one component of a comprehensive MRSA colonization surveillance program. It is  not intended to diagnose MRSA infection nor to guide or monitor treatment for MRSA infections. Performed at [facility], [address] [place], [place] [address]     Coagulation Studies: No results for input(s): LABPROT, INR in the last 72 hours.  Urinalysis: Recent Labs    02/06/19 2350  COLORURINE AMBER*  LABSPEC 1.020  PHURINE 5.0  GLUCOSEU NEGATIVE  HGBUR SMALL*  BILIRUBINUR NEGATIVE  KETONESUR NEGATIVE  PROTEINUR 100*  NITRITE NEGATIVE  LEUKOCYTESUR LARGE*      Imaging: No results found.  Medications:    . amLODipine  10 mg Oral Daily  . heparin  5,000 Units Subcutaneous Q8H  . hydrALAZINE  50 mg Oral TID  . insulin aspart  0-5 Units Subcutaneous QHS  . insulin aspart  0-9 Units Subcutaneous TID WC  . insulin glargine  20 Units Subcutaneous QHS  . methadone  10 mg Oral BID  . metoprolol succinate  25 mg Oral Daily  . pravastatin  40 mg Oral Daily  . sodium bicarbonate  1,300 mg Oral BID  . sodium polystyrene  30 g Oral Once  . [START ON 02/10/2019] sodium zirconium cyclosilicate  10 g Oral Daily  . traZODone  150 mg Oral QHS   acetaminophen **OR** acetaminophen, bisacodyl, ondansetron **OR** ondansetron (ZOFRAN) IV, oxyCODONE  Assessment/ Plan:  58 y.o. male with   Principal Problem:   UTI (urinary tract infection) Active Problems:   AKI (acute kidney injury) (HCC)   Hyperkalemia   Diabetes (HCC)   Pressure injury of skin   MRSA bacteremia   Osteomyelitis of thoracic spine (HCC)   Peripheral vascular disease (HCC)   Hypertension   Dyslipidemia   Chronic pain syndrome   Polysubstance abuse (HCC)   Chronic hepatitis C without hepatic coma (HCC)   GERD (gastroesophageal reflux disease)   Cigarette smoker   Normocytic anemia   #.  Acute kidney injury Baseline creatinine of 1.2/GFR 65 from 01/08/2019 noted in care everywhere Vancomycin level of 24.5 noted on the same day Acute kidney injury likely secondary to toxicity from  high vancomycin levels. Other contributing conditions include use of high-dose ibuprofen 800 mg 3 times per day,, ACE inhibitor lisinopril 20 mg daily, furosemide 40 mg twice a day -Renal function continues to improve.  Creatinine down to 1.27.  Avoid nephrotoxins as possible.  #. Anemia of CKD  Lab Results  Component Value Date   HGB 8.3 (L) 02/09/2019  Hemoglobin currently 8.3.  No acute indication for Epogen but will need to continue to monitor hemoglobin closely.   #.  Insulin-dependent diabetes with proteinuria, diabetic retinopathy Hgb A1c MFr Bld (%)  Date Value  02/08/2019 6.3 (H)  A1c currently at target.   # Severe hyperkalemia Likely iatrogenic.  Patient was on potassium supplements as outpatient which likely got worse due to acute kidney injury -Reduce Lokelma to 10 g daily.   LOS: 2 Kalandra Masters 5/18/202011:38 AM  [facility] [place], [place] [phone]

## 2019-02-09 NOTE — Progress Notes (Signed)
I walked in to give patient medications and saw PICC line lying in the bed beside him, I asked him if he had removed it and he responded "It was old"

## 2019-02-09 NOTE — Progress Notes (Signed)
[employer] [facility] [place] [facility]   PATIENT NAME: Eugene [name]    MR#:  [id number]  DATE OF BIRTH:  [birth year]  SUBJECTIVE:  CHIEF COMPLAINT:   Chief Complaint  Patient presents with  . Acute Renal Failure  . hyperkalemia   This morning patient complained of significant back pain.  Was treated for thoracic spine osteomyelitis at [facility] recently.  Requested for MRI of the thoracic spine for further evaluation given degree of pain.  No fevers.Marland Kitchen  REVIEW OF SYSTEMS:  Review of Systems  Constitutional: Negative for chills and fever.  HENT: Negative for hearing loss and tinnitus.   Eyes: Negative for blurred vision and double vision.  Respiratory: Negative for cough and shortness of breath.   Cardiovascular: Negative for chest pain.  Gastrointestinal: Negative for abdominal pain, heartburn, nausea and vomiting.  Genitourinary: Negative for dysuria and urgency.  Musculoskeletal: Negative for neck pain.  Skin: Negative for rash.  Neurological: Negative for dizziness and headaches.  Psychiatric/Behavioral: Negative for depression and hallucinations.    DRUG ALLERGIES:  No Known Allergies VITALS:  Blood pressure 138/79, pulse 76, temperature 98.4 F (36.9 C), resp. rate 18, weight 106 kg, SpO2 (!) 89 %. PHYSICAL EXAMINATION:   Physical Exam  Constitutional: He is oriented to person, place, and time. He appears well-developed.  HENT:  Head: Normocephalic and atraumatic.  Right Ear: External ear normal.  Eyes: Pupils are equal, round, and reactive to light. Conjunctivae are normal. Right eye exhibits no discharge.  Neck: Normal range of motion. Neck supple. No tracheal deviation present.  Cardiovascular: Normal rate, regular rhythm and normal heart sounds.  Respiratory: Effort normal and breath sounds normal. No respiratory distress.  GI: Soft. Bowel sounds are normal. He exhibits no distension.  Musculoskeletal: Normal range of motion.        General: No  tenderness.     Comments: Patient status post bilateral BKA  Neurological: He is alert and oriented to person, place, and time. No cranial nerve deficit.  Skin: Skin is warm. He is not diaphoretic. No erythema.  Psychiatric: He has a normal mood and affect. His behavior is normal.   LABORATORY PANEL:  Male CBC Recent Labs  Lab 02/09/19 0638  WBC 6.8  HGB 8.3*  HCT 26.1*  PLT 243   ------------------------------------------------------------------------------------------------------------------ Chemistries  Recent Labs  Lab 02/06/19 2317  02/09/19 0638  NA 137   < > 136  K 7.5*   < > 4.9  CL 114*   < > 114*  CO2 14*   < > 15*  GLUCOSE 149*   < > 176*  BUN 93*   < > 58*  CREATININE 2.93*   < > 1.17  CALCIUM 9.1   < > 9.9  MG  --    < > 1.8  AST 14*  --   --   ALT 16  --   --   ALKPHOS 56  --   --   BILITOT 0.4  --   --    < > = values in this interval not displayed.   RADIOLOGY:  No results found. ASSESSMENT AND PLAN:    1. UTI (urinary tract infection)  Catheter associated urinary tract infection.  Patient has chronic indwelling Foley catheter.   Patient was initially placed on IV antibiotics with cefepime.  This morning I discussed case with infectious disease specialist on-call Dr. Orvan Falconer who confirmed this is most likely asymptomatic funguria after reviewing urine culture and discontinued cefepime.  2. AKI (acute kidney injury) (HCC) - Resolved with IV fluids. Avoid nephrotoxins and monitor.   Patient apparently was on vancomycin for nearly 9 weeks for prior osteomyelitis.   Nephrology following. Likely contributed by vancomycin toxicity.  Infectious disease specialist Dr. Orvan Falconerampbell input appreciated.  Vancomycin discontinued.  3.  Hyperkalemia  Treated medically and resolved with potassium level down to 4.9   4. Diabetes (HCC) -sliding scale insulin coverage  5. 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   [facility]  He had positive blood cultures for about 1 week before they finally became negative.  There is no evidence of endocarditis by TEE.  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  He was discharged to a skilled nursing facility on 01/08/2019 with a plan to complete 6 weeks of IV vancomycin following first negative blood culture on 01/30/2019.  He was due to follow-up with his neurosurgeon, Dr. Joselyn Glassmanyler, 1 month after [facility]  He was also scheduled to follow-up with his general surgeon who performed the BKA. Appreciate input from infectious disease specialist on-call Dr. Orvan Falconerampbell.  Vancomycin and cefepime discontinued. Patient reported to have accidentally pulled out his PICC line last night. Due to the degree of back pains, I requested for MRI of the thoracic spine for further evaluation.  DVT prophylaxis; heparin  All the records are reviewed and case discussed with Care Management/Social Worker. Management plans discussed with the patient, family and they are in agreement.  CODE STATUS: Full Code  TOTAL TIME TAKING CARE OF THIS PATIENT: 36 minutes.   More than 50% of the time was spent in counseling/coordination of care: YES  POSSIBLE D/C IN 2 DAYS, DEPENDING ON CLINICAL CONDITION.   Lyndia Bury M.D on 02/09/2019 at 1:16 PM  Between 7am to 6pm - Pager - [phone]  After 6pm go to [email or web address] - Social research officer, governmentpassword [email or web address] [email or web address]  [employer] [facility] [place] [facility]  Office  [phone]  CC: Primary care physician; Dimple CaseySmith, Sean A, MD  Note: This dictation was prepared with Dragon dictation along with smaller phrase technology. Any transcriptional errors that result from this process are unintentional.

## 2019-02-09 NOTE — [place] FL2 (Signed)
[place] MEDICAID FL2 LEVEL OF CARE SCREENING TOOL     IDENTIFICATION  Patient Name: Eugene [name] Birthdate: 11-06-1960 Sex: male Admission Date (Current Location): 02/06/2019  [place]   Facility and Address:  [facility], [address], [place], [place] [id number]      Provider Number: [id number]  Attending Physician Name and Address:  Jama Flavorsjie, Jude, MD  Relative Name and Phone Number:  none listed    Current Level of Care: Hospital Recommended Level of Care: Skilled Nursing Facility Prior Approval Number:    Date Approved/Denied: 03/05/05 PASRR Number: [id number]  Discharge Plan: SNF    Current Diagnoses: Patient Active Problem List   Diagnosis Date Noted  . MRSA bacteremia 02/08/2019  . Osteomyelitis of thoracic spine (HCC) 02/08/2019  . Peripheral vascular disease (HCC) 02/08/2019  . Hypertension 02/08/2019  . Dyslipidemia 02/08/2019  . Chronic pain syndrome 02/08/2019  . Polysubstance abuse (HCC) 02/08/2019  . Chronic hepatitis C without hepatic coma (HCC) 02/08/2019  . GERD (gastroesophageal reflux disease) 02/08/2019  . Cigarette smoker 02/08/2019  . Normocytic anemia 02/08/2019  . UTI (urinary tract infection) 02/07/2019  . AKI (acute kidney injury) (HCC) 02/07/2019  . Hyperkalemia 02/07/2019  . Diabetes (HCC) 02/07/2019  . Pressure injury of skin 02/07/2019    Orientation RESPIRATION BLADDER Height & Weight     Self, Time, Situation, Place  Normal Incontinent Weight: 106 kg Height:     BEHAVIORAL SYMPTOMS/MOOD NEUROLOGICAL BOWEL NUTRITION STATUS      Incontinent Diet  AMBULATORY STATUS COMMUNICATION OF NEEDS Skin   Total Care Verbally Normal                       Personal Care Assistance Level of Assistance  Bathing, Dressing, Total care Bathing Assistance: Maximum assistance   Dressing Assistance: Maximum assistance Total Care Assistance: Maximum assistance   Functional  Limitations Info  Sight, Hearing, Speech Sight Info: Adequate Hearing Info: Adequate Speech Info: Adequate    SPECIAL CARE FACTORS FREQUENCY  PT (By licensed PT), OT (By licensed OT)     PT Frequency: 5 times per week OT Frequency: 5 times per week            Contractures Contractures Info: Not present    Additional Factors Info  Code Status Code Status Info: full             Current Medications (02/09/2019):  This is the current hospital active medication list Current Facility-Administered Medications  Medication Dose Route Frequency Provider Last Rate Last Dose  . acetaminophen (TYLENOL) tablet 650 mg  650 mg Oral Q6H PRN Oralia ManisWillis, David, MD       Or  . acetaminophen (TYLENOL) suppository 650 mg  650 mg Rectal Q6H PRN Oralia ManisWillis, David, MD      . amLODipine (NORVASC) tablet 10 mg  10 mg Oral Daily Enid Baasjie, Jude, MD   10 mg at 02/09/19 0813  . bisacodyl (DULCOLAX) suppository 10 mg  10 mg Rectal Daily PRN Oralia ManisWillis, David, MD      . heparin injection 5,000 Units  5,000 Units Subcutaneous Willow OraQ8H Willis, David, MD   5,000 Units at 02/09/19 1328  . hydrALAZINE (APRESOLINE) tablet 50 mg  50 mg Oral TID Enid Baasjie, Jude, MD   50 mg at 02/09/19 0813  . insulin aspart (novoLOG) injection 0-5 Units  0-5 Units Subcutaneous QHS Oralia ManisWillis, David, MD      . insulin aspart (novoLOG) injection 0-9 Units  0-9 Units  Subcutaneous TID WC Oralia Manis, MD   2 Units at 02/09/19 1328  . insulin glargine (LANTUS) injection 20 Units  20 Units Subcutaneous QHS Enid Baas, Jude, MD   20 Units at 02/08/19 2210  . methadone (DOLOPHINE) tablet 10 mg  10 mg Oral BID Enid Baas, Jude, MD   10 mg at 02/09/19 0813  . metoprolol succinate (TOPROL-XL) 24 hr tablet 25 mg  25 mg Oral Daily Ojie, Jude, MD   25 mg at 02/09/19 0813  . ondansetron (ZOFRAN) tablet 4 mg  4 mg Oral Q6H PRN Oralia Manis, MD       Or  . ondansetron [facility]) injection 4 mg  4 mg Intravenous Q6H PRN Oralia Manis, MD      . oxyCODONE (Oxy IR/ROXICODONE) immediate  release tablet 5 mg  5 mg Oral Q4H PRN Enid Baas, Jude, MD   5 mg at 02/09/19 1328  . pravastatin (PRAVACHOL) tablet 40 mg  40 mg Oral Daily Ojie, Jude, MD   40 mg at 02/09/19 0813  . sodium bicarbonate tablet 1,300 mg  1,300 mg Oral BID Mosetta Pigeon, MD   1,300 mg at 02/09/19 2423  . sodium polystyrene (KAYEXALATE) 15 GM/60ML suspension 30 g  30 g Oral Once Ojie, Jude, MD      . Melene Muller ON 02/10/2019] sodium zirconium cyclosilicate (LOKELMA) packet 10 g  10 g Oral Daily Lateef, Munsoor, MD      . traZODone (DESYREL) tablet 150 mg  150 mg Oral QHS Ojie, Jude, MD   150 mg at 02/08/19 2209     Discharge Medications: Please see discharge summary for a list of discharge medications.  Relevant Imaging Results:  Relevant Lab Results:   Additional Information [id number]  Barrie Dunker, RN

## 2019-02-10 LAB — BASIC METABOLIC PANEL
Anion gap: 6 (ref 5–15)
BUN: 50 mg/dL — ABNORMAL HIGH (ref 6–20)
CO2: 17 mmol/L — ABNORMAL LOW (ref 22–32)
Calcium: 9.8 mg/dL (ref 8.9–10.3)
Chloride: 116 mmol/L — ABNORMAL HIGH (ref 98–111)
Creatinine, Ser: 0.95 mg/dL (ref 0.61–1.24)
GFR calc Af Amer: >60 mL/min (ref >60)
GFR calc non Af Amer: >60 mL/min (ref >60)
Glucose, Bld: 153 mg/dL — ABNORMAL HIGH (ref 70–99)
Potassium: 4.4 mmol/L (ref 3.5–5.1)
Sodium: 139 mmol/L (ref 135–145)

## 2019-02-10 LAB — CBC
HCT: 23.2 % — ABNORMAL LOW (ref 39.0–52.0)
Hemoglobin: 7.4 g/dL — ABNORMAL LOW (ref 13.0–17.0)
MCH: 28.7 pg (ref 26.0–34.0)
MCHC: 31.9 g/dL (ref 30.0–36.0)
MCV: 89.9 fL (ref 80.0–100.0)
Platelets: 240 10*3/uL (ref 150–400)
RBC: 2.58 MIL/uL — ABNORMAL LOW (ref 4.22–5.81)
RDW: 14 % (ref 11.5–15.5)
WBC: 6.3 10*3/uL (ref 4.0–10.5)
nRBC: 0 % (ref 0.0–0.2)

## 2019-02-10 LAB — MAGNESIUM: Magnesium: 2 mg/dL (ref 1.7–2.4)

## 2019-02-10 LAB — GLUCOSE, CAPILLARY
Glucose-Capillary: 121 mg/dL — ABNORMAL HIGH (ref 70–99)
Glucose-Capillary: 187 mg/dL — ABNORMAL HIGH (ref 70–99)

## 2019-02-10 LAB — HIV ANTIBODY (ROUTINE TESTING W REFLEX): HIV Screen 4th Generation wRfx: NONREACTIVE

## 2019-02-10 MED ORDER — OXYCODONE HCL 5 MG PO TABS
5.0000 mg | ORAL_TABLET | ORAL | Status: DC | PRN
  Administered 2019-02-10: 5 mg via ORAL
  Filled 2019-02-10: qty 1

## 2019-02-10 NOTE — Discharge Summary (Addendum)
[facility] [place] [facility]   PATIENT NAME: Eugene [name]    MR#:  [id number]  DATE OF BIRTH:  1961-04-13  DATE OF ADMISSION:  02/06/2019   ADMITTING PHYSICIAN: Oralia Manis, MD  DATE OF DISCHARGE: 02/10/2019  PRIMARY CARE PHYSICIAN: Dimple Casey, MD   ADMISSION DIAGNOSIS:  Hyperkalemia [E87.5] Acute renal failure, unspecified acute renal failure type (HCC) [N17.9] DISCHARGE DIAGNOSIS:  Principal Problem:   UTI (urinary tract infection) Active Problems:   AKI (acute kidney injury) (HCC)   Hyperkalemia   Diabetes (HCC)   Pressure injury of skin   MRSA bacteremia   Osteomyelitis of thoracic spine (HCC)   Peripheral vascular disease (HCC)   Hypertension   Dyslipidemia   Chronic pain syndrome   Polysubstance abuse (HCC)   Chronic hepatitis C without hepatic coma (HCC)   GERD (gastroesophageal reflux disease)   Cigarette smoker   Normocytic anemia  SECONDARY DIAGNOSIS:   Past Medical History:  Diagnosis Date   Diabetes mellitus without complication (HCC)    Osteomyelitis (HCC)    HOSPITAL COURSE:  Chief complaint Patient sent from nursing home for management of acute renal failure and hyperkalemia  History of presenting complaint; Eugene [name]  is a 58 y.o. male who presented to the ED after being told he had abnormal labs.  His creatinine was significantly elevated, and his potassium was very high.  Recently he has had a near normal creatinine.  Further evaluation in the ED shows likely UTI.  Patient does have an indwelling Foley catheter.  He had also been on long-term antibiotics for thoracic spine osteomyelitis following thoracic laminectomy done by neurosurgeon Dr. Laretta Bolster at [facility] in March 2020.  Patient was subsequently discharged to nursing home and had been on IV vancomycin for the last 9 weeks.  Was diagnosed with acute kidney injury and admitted to medical service.   Hospital course; 1.  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  This was treated at [facility] in March 2020.  Patient had thoracic laminectomy done by neurosurgeon Dr. Laretta Bolster. He had positive blood cultures for about 1 week before they finally became negative. There is no evidence of endocarditis by TEE. 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 He was discharged to a skilled nursing facility on 01/08/2019 with a plan to complete 6 weeks of IV vancomycin following first negative blood culture on 01/30/2019. He was due to follow-up with his neurosurgeon, Dr. Ok Edwards month after discharge from [facility].   During this admission I consulted with infectious disease specialist on-call Dr. Orvan Falconer.  Vancomycin and cefepime discontinued since he was extubated at patient had had a total of 9 weeks of IV vancomycin.  Patient already accidentally pulled out the PICC line during this admission. However due to degree of pain which patient described as worsening , MRI of the thoracic spine was done last night which revealed T8-9 discitis osteomyelitis. 50% loss of height of T8 vertebral body and deformity of T9 superior endplate with 10-20% loss of vertebral body height.. Focal kyphosis and 7 mm bony retropulsion of T8 vertebral body impinge the spinal cord. 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 Dorsal epidural abscess extends superiorly to T5 level. Small ventral epidural abscess may be present posterior to T9 vertebral body. Dorsal epidural abscess appears to communicate with a collection in the left T8-9  facet and multiloculated collection within dorsal soft tissues. I discussed case with neurosurgeon on-call at this facility Dr. Adriana Simasook who reviewed the images and also examined patient this morning.  He recommended having patient transferred back to the neurosurgeon Dr. Richardean Canalyler  [place] [facility] who did the prior thoracic laminectomy.  I called and discussed case with Dr. Joselyn Glassmanyler who stated patient needed to be transferred to tertiary center at the [facility].  I called and discussed case with the [facility]surgeon on-[facility] at [facility] Dr. Kevan RosebushBhowmick who has accepted patient in transfer.  He will evaluate [facility] at [facility] and decision will be made if surgery is indicated.  He requested.if no surgical intervention is required, patient will have to be transferred back to this facility which I agree to since our neurosurgeon recommended transfer to tertiary center for further evaluation. I have ordered back brace to be applied by physical therapy prior to transfer to stabilize the back as recommended by neurosurgeon  2. UTI (urinary tract infection)  Catheter associated urinary tract infection.  Patient has chronic indwelling Foley catheter.   Patient was initially placed on IV antibiotics with cefepime.  2 days ago I discussed case with infectious disease specialist on-call Dr. Orvan Falconerampbell who confirmed this is most likely asymptomatic funguria after reviewing urine culture and discontinued cefepime.  Patient remains asymptomatic.  3. AKI (acute kidney injury) (HCC) - Resolved with IV fluids.Creatinine was 2.93 on admission with BUN of 93.  This is resolved with discontinuation of vancomycin and IV fluid hydration.  Most recent renal function with creatinine of 0.95 today and BUN of 50. Patient apparently was on vancomycin for nearly 9 weeks for prior osteomyelitis.    Appreciate nephrology input. Likely contributed by vancomycin toxicity.  Infectious disease specialist Dr. Orvan Falconerampbell input appreciated.  Vancomycin discontinued.  4.  Hyperkalemia  Treated medically and resolved with potassium level down to 4.4  5.Diabetes (HCC) -sliding scale insulin coverage  I called patient's daughter listed in the chart Mr. Josem KaufmannLance Njie to update him on treatment plans as well as  [facility].  No response.  No capability for leaving voicemail.  DISCHARGE CONDITIONS:  Guarded CONSULTS OBTAINED:  Treatment Team:  Mosetta PigeonSingh, Harmeet, MD DRUG ALLERGIES:  No Known Allergies DISCHARGE MEDICATIONS:   Allergies as of 02/10/2019   No Known Allergies     Medication List    STOP taking these medications   furosemide 40 MG tablet Commonly known as:  LASIX   ibuprofen 800 MG tablet Commonly known as:  ADVIL   lisinopril 20 MG tablet Commonly known as:  ZESTRIL   potassium chloride SA 20 MEQ tablet Commonly known as:  K-DUR   sodium chloride 0.9 % injection   vancomycin  IVPB     TAKE these medications   acetaminophen 325 MG tablet Commonly known as:  TYLENOL Take 650 mg by mouth every 4 (four) hours as needed for mild pain or fever.   AMINO ACID PO Take 30 mLs by mouth.   amLODipine 10 MG tablet Commonly known as:  NORVASC Take 10 mg by mouth daily.   bisacodyl 10 MG suppository Commonly known as:  DULCOLAX Place 10 mg rectally daily.   bisacodyl 5 MG EC tablet Commonly known as:  DULCOLAX Take 10 mg by mouth daily.   diphenhydrAMINE 50 MG capsule Commonly known as:  BENADRYL Take 50 mg by mouth every 6 (six) hours as needed for itching.   docusate sodium 100 MG capsule Commonly  known as:  COLACE Take 100 mg by mouth 2 (two) times daily.   famotidine 20 MG tablet Commonly known as:  PEPCID Take 20 mg by mouth 2 (two) times daily.   ferrous gluconate 324 MG tablet Commonly known as:  FERGON Take 324 mg by mouth daily with breakfast.   heparin 5000 UNIT/ML injection Inject 5,000 Units into the skin every 12 (twelve) hours.   heparin flush 10 UNIT/ML Soln injection Inject 50 Units into the vein 2 (two) times daily. Also flush with 10ml of normal saline   hydrALAZINE 50 MG tablet Commonly known as:  APRESOLINE Take 50 mg by mouth 3 (three) times daily.   insulin glargine 100 UNIT/ML injection Commonly known as:   LANTUS Inject 20 Units into the skin at bedtime.   insulin lispro 100 UNIT/ML injection Commonly known as:  HUMALOG Inject 0-15 Units into the skin 4 (four) times daily -  before meals and at bedtime. <150= 0 units 151-200= 3 units 201-250= 6 units 251-300= 9 units 301-350= 12 units 351-400= 15 units >400 call MD   Lactulose 20 GM/30ML Soln Take 30 g by mouth 2 (two) times daily.   Linzess 290 MCG Caps capsule Generic drug:  linaclotide Take 290 mcg by mouth daily before breakfast.   methadone 10 MG tablet Commonly known as:  DOLOPHINE Take 10 mg by mouth 2 (two) times daily.   methocarbamol 500 MG tablet Commonly known as:  ROBAXIN Take 500 mg by mouth 4 (four) times daily.   metoprolol succinate 25 MG 24 hr tablet Commonly known as:  TOPROL-XL Take 25 mg by mouth daily.   oxycodone 5 MG capsule Commonly known as:  OXY-IR Take 5 mg by mouth every 4 (four) hours as needed for pain.   polyethylene glycol 17 g packet Commonly known as:  MIRALAX / GLYCOLAX Take 17 g by mouth daily.   pravastatin 40 MG tablet Commonly known as:  PRAVACHOL Take 40 mg by mouth daily.   pregabalin 75 MG capsule Commonly known as:  LYRICA Take 75 mg by mouth 2 (two) times daily.   sodium phosphate Pediatric 3.5-9.5 GM/59ML enema Place 1 enema rectally every other day as needed for severe constipation.   traZODone 150 MG tablet Commonly known as:  DESYREL Take 150 mg by mouth at bedtime.        DISCHARGE INSTRUCTIONS:   DIET:  Cardiac diet DISCHARGE CONDITION:  Stable ACTIVITY:  Bedrest OXYGEN:  Home Oxygen: No.  Oxygen Delivery: room air DISCHARGE LOCATION:  Transfer to [facility]  If you experience worsening of your admission symptoms, develop shortness of breath, life threatening emergency, suicidal or homicidal thoughts you must seek medical attention immediately by calling 911 or calling your MD immediately  if symptoms less severe.  You Must read complete  instructions/literature along with all the possible adverse reactions/side effects for all the Medicines you take and that have been prescribed to you. Take any new Medicines after you have completely understood and accpet all the possible adverse reactions/side effects.   Please note  You were cared for by a hospitalist during your hospital stay. If you have any questions about your discharge medications or the care you received while you were in the hospital after you are discharged, you can call the unit and asked to speak with the hospitalist on call if the hospitalist that took care of you is not available. Once you are discharged, your primary care physician will handle any further medical issues. Please  note that NO REFILLS for any discharge medications will be authorized once you are discharged, as it is imperative that you return to your primary care physician (or establish a relationship with a primary care physician if you do not have one) for your aftercare needs so that they can reassess your need for medications and monitor your lab values.    On the day of Discharge:  VITAL SIGNS:  Blood pressure 129/69, pulse 74, temperature 98.4 F (36.9 C), temperature source Oral, resp. rate 17, height 6' 0.01" (1.829 m), weight 106 kg, SpO2 97 %. PHYSICAL EXAMINATION:   Constitutional: He is oriented to person, place, and time. He appears well-developed.  HENT:  Head: Normocephalic and atraumatic.  Right Ear: External ear normal.  Eyes: Pupils are equal, round, and reactive to light. Conjunctivae are normal. Right eye exhibits no discharge.  Neck: Normal range of motion. Neck supple. No tracheal deviation present.  Cardiovascular: Normal rate, regular rhythm and normal heart sounds.  Respiratory: Effort normal and breath sounds normal. No respiratory distress.  GI: Soft. Bowel sounds are normal. He exhibits no distension.  Patient has chronic indwelling Foley catheter. Musculoskeletal:  Normal range of motion.        General: No tenderness.     Comments: Patient status post bilateral BKA  Neurological: He is alert and oriented to person, place, and time. No cranial nerve deficit.  Skin: Skin is warm. He is not diaphoretic. No erythema.  Psychiatric: He has a normal mood and affect. His behavior is normal.   DATA REVIEW:   CBC Recent Labs  Lab 02/10/19 0336  WBC 6.3  HGB 7.4*  HCT 23.2*  PLT 240    Chemistries  Recent Labs  Lab 02/06/19 2317  02/10/19 0336  NA 137   < > 139  K 7.5*   < > 4.4  CL 114*   < > 116*  CO2 14*   < > 17*  GLUCOSE 149*   < > 153*  BUN 93*   < > 50*  CREATININE 2.93*   < > 0.95  CALCIUM 9.1   < > 9.8  MG  --    < > 2.0  AST 14*  --   --   ALT 16  --   --   ALKPHOS 56  --   --   BILITOT 0.4  --   --    < > = values in this interval not displayed.     Microbiology Results  Results for orders placed or performed during the hospital encounter of 02/06/19  Blood Culture (routine x 2)     Status: None (Preliminary result)   Collection Time: 02/06/19 11:50 PM  Result Value Ref Range Status   Specimen Description BLOOD LEFT UPPER ARM  Final   Special Requests   Final    BOTTLES DRAWN AEROBIC AND ANAEROBIC Blood Culture adequate volume   Culture   Final    NO GROWTH 3 DAYS Performed at [facility], [address], [place], [place] [address]    Report Status PENDING  Incomplete  Blood Culture (routine x 2)     Status: None (Preliminary result)   Collection Time: 02/06/19 11:50 PM  Result Value Ref Range Status   Specimen Description BLOOD LEFT HAND  Final   Special Requests   Final    BOTTLES DRAWN AEROBIC AND ANAEROBIC Blood Culture results may not be optimal due to an excessive volume of blood received in culture bottles  Culture   Final    NO GROWTH 3 DAYS Performed at [facility], [address] [place], [place] [address]    Report Status PENDING  Incomplete  SARS Coronavirus 2 (CEPHEID -  Performed in [facility] hospital lab), Hosp Order     Status: None   Collection Time: 02/06/19 11:50 PM  Result Value Ref Range Status   SARS Coronavirus 2 NEGATIVE NEGATIVE Final    Comment: (NOTE) If result is NEGATIVE SARS-CoV-2 target nucleic acids are NOT DETECTED. The SARS-CoV-2 RNA is generally detectable in upper and lower  respiratory specimens during the acute phase of infection. The lowest  concentration of SARS-CoV-2 viral copies this assay can detect is 250  copies / mL. A negative result does not preclude SARS-CoV-2 infection  and should not be used as the sole basis for treatment or other  patient management decisions.  A negative result may occur with  improper specimen collection / handling, submission of specimen other  than nasopharyngeal swab, presence of viral mutation(s) within the  areas targeted by this assay, and inadequate number of viral copies  (<250 copies / mL). A negative result must be combined with clinical  observations, patient history, and epidemiological information. If result is POSITIVE SARS-CoV-2 target nucleic acids are DETECTED. The SARS-CoV-2 RNA is generally detectable in upper and lower  respiratory specimens dur ing the acute phase of infection.  Positive  results are indicative of active infection with SARS-CoV-2.  Clinical  correlation with patient history and other diagnostic information is  necessary to determine patient infection status.  Positive results do  not rule out bacterial infection or co-infection with other viruses. If result is PRESUMPTIVE POSTIVE SARS-CoV-2 nucleic acids MAY BE PRESENT.   A presumptive positive result was obtained on the submitted specimen  and confirmed on repeat testing.  While 2019 novel coronavirus  (SARS-CoV-2) nucleic acids may be present in the submitted sample  additional confirmatory testing may be necessary for epidemiological  and / or clinical management purposes  to differentiate between    SARS-CoV-2 and other Sarbecovirus currently known to infect humans.  If clinically indicated additional testing with an alternate test  methodology [phone]) is advised. The SARS-CoV-2 RNA is generally  detectable in upper and lower respiratory sp ecimens during the acute  phase of infection. The expected result is Negative. Fact Sheet for Patients:  BoilerBrush.com.cy Fact Sheet for Healthcare Providers: https://pope.com/ This test is not yet approved or cleared by the [place] FDA and has been authorized for detection and/or diagnosis of SARS-CoV-2 by FDA under an Emergency Use Authorization (EUA).  This EUA will remain in effect (meaning this test can be used) for the duration of the COVID-19 declaration under Section 564(b)(1) of the Act, 21 U.S.C. section 360bbb-3(b)(1), unless the authorization is terminated or revoked sooner. Performed at [facility], [address], [place], [place] [address]   Urine Culture     Status: Abnormal   Collection Time: 02/06/19 11:50 PM  Result Value Ref Range Status   Specimen Description   Final    URINE, RANDOM Performed at [facility], [address], [place], [place] [address]    Special Requests   Final    NONE Performed at [facility], [address] [place], [place] [address]    Culture 30,000 COLONIES/mL YEAST (A)  Final   Report Status 02/08/2019 FINAL  Final  MRSA PCR Screening     Status: None   Collection Time:  02/07/19  3:41 AM  Result Value Ref Range Status   MRSA by PCR NEGATIVE NEGATIVE Final    Comment:        The GeneXpert MRSA Assay (FDA approved for NASAL specimens only), is one component of a comprehensive MRSA colonization surveillance program. It is not intended to diagnose MRSA infection nor to guide or monitor treatment for MRSA infections. Performed at [facility], [address] [place], [place] [address]      RADIOLOGY:  Maeola Harman  Result Date: 02/09/2019 CLINICAL DATA:  Screening for MRI EXAM: ORBITS - COMPLETE 4+ VIEW COMPARISON:  None. FINDINGS: There is no evidence of metallic foreign body within the orbits. No significant bone abnormality identified. IMPRESSION: No evidence of metallic foreign body within the orbits. Electronically Signed   By: Charlett Nose M.D.   On: 02/09/2019 15:18   Dg Chest 1 View  Result Date: 02/09/2019 CLINICAL DATA:  Unknown tetanus immunization status.  For  MRI. EXAM: CHEST  1 VIEW COMPARISON:  None. FINDINGS: Right jugular central venous catheter tip at the cavoatrial junction. No pneumothorax. Lungs are clear without infiltrate or effusion. No implanted device IMPRESSION: Central line at the cavoatrial junction.  No acute abnormality. Electronically Signed   By: Marlan Palau M.D.   On: 02/09/2019 15:28   Dg Pelvis 1-2 Views  Result Date: 02/09/2019 CLINICAL DATA:  Screening for MRI EXAM: PELVIS - 1-2 VIEW COMPARISON:  None. FINDINGS: No visible unexpected radiopaque foreign body. Hip joints and SI joints are symmetric. No acute bony abnormality. Specifically, no fracture, subluxation, or dislocation. IMPRESSION: No radiopaque foreign body.  No acute bony abnormality. Electronically Signed   By: Charlett Nose M.D.   On: 02/09/2019 15:19   Dg Abd 1 View  Result Date: 02/09/2019 CLINICAL DATA:  Unknown tetanus immunization status EXAM: ABDOMEN - 1 VIEW COMPARISON:  None FINDINGS: Distended colon diffusely. Moderate amount of stool in the colon. No small bowel dilatation. No acute skeletal abnormality.  No implant or metallic foreign body. IMPRESSION: Colonic ileus. Electronically Signed   By: Marlan Palau M.D.   On: 02/09/2019 15:27   Mr Thoracic Spine Wo Contrast  Result Date: 02/09/2019 CLINICAL DATA:  58 y/o M; T8-T9 vertebral osteomyelitis post laminectomy 12/22/2018 for follow-up. EXAM: MRI THORACIC SPINE WITHOUT CONTRAST TECHNIQUE: Multiplanar,  multisequence MR imaging of the thoracic spine was performed. No intravenous contrast was administered. COMPARISON:  None. FINDINGS: Severely motion degraded study. Alignment: T8-9 grade 1 retrolisthesis and focal kyphosis due to partial collapse of the vertebral bodies and loss of the intervertebral disc space. Vertebrae: T8-9 discitis osteomyelitis. Diffuse low T1 signal and increased T2 signal throughout the T8 and T9 vertebral bodies extending into the transverse and posterior elements. There is approximately 50% loss of height of the T8 vertebral body and deformity of the superior endplate of the T9 vertebral body with 10-20% loss of height. Associated focal kyphosis and up to 7 mm of bony retropulsion of T8 impinge the spinal cord which demonstrates cord edema (series 23, image 10). There is a dorsal epidural abscess that extends superiorly to the T5 level. The epidural collection is in communication with a multiloculated collection within the dorsal subcutaneous fat and paraspinal muscles extending from T6-T11. The 2 collections appear to communicate through a fluid collection within the left T8-9 facet joint (series 23, image 10). There may be a small ventral epidural abscess posterior to the T9 vertebral body as well. Cord: Increased cord signal from the T6-7 level  to the T9-10 level likely related to compressive myelopathy as described above. Paraspinal and other soft tissues: Extensive edema within the paravertebral soft tissues as well as paraspinal muscles and dorsal subcutaneous fat. Small anterior paraspinal collection are present at the T8-9 intervertebral disc level bilaterally measuring up to 18 mm in the right and 10 mm on the left (series 24, image 26). Disc levels: No significant spinal canal stenosis or foraminal stenosis at additional thoracic spinal levels. IMPRESSION: Severe motion degraded study. 1. T8-9 discitis osteomyelitis. 50% loss of height of T8 vertebral body and deformity of T9  superior endplate with 10-20% loss of vertebral body height. 2. Focal kyphosis and 7 mm bony retropulsion of T8 vertebral body impinge the spinal cord. 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 3. Dorsal epidural abscess extends superiorly to T5 level. Small ventral epidural abscess may be present posterior to T9 vertebral body. 4. Dorsal epidural abscess appears to communicate with a collection in the left T8-9 facet and multiloculated collection within dorsal soft tissues. 5. Small anterior paraspinal collections at the T8-9 disc level measuring 18 mm on right and 10 mm on the left. These results will be called to the ordering clinician or representative by the Radiologist Assistant, and communication documented in the PACS or zVision Dashboard. Electronically Signed   By: Mitzi Hansen M.D.   On: 02/09/2019 19:51     Management plans discussed with the patient, family and they are in agreement.  CODE STATUS: Full Code   TOTAL TIME TAKING CARE OF THIS PATIENT: 65 minutes.    Katy Brickell M.D on 02/10/2019 at 9:37 AM  Between 7am to 6pm - Pager - [phone]  After 6pm go to [email or web address] - Social research officer, government  [facility]  Office  [phone]  CC: Primary care physician; Dimple Casey, MD   Note: This dictation was prepared with Dragon dictation along with smaller phrase technology. Any transcriptional errors that result from this process are unintentional.

## 2019-02-10 NOTE — Progress Notes (Signed)
Called RN at [employer] gave report also spoke with Asher Muir with [facility] transport they will call back with an ETA on pick up

## 2019-02-10 NOTE — TOC Transition Note (Addendum)
Transition of Care [facility]) - CM/SW Discharge Note   Patient Details  Name: Eugene [name] MRN: [id number] Date of Birth: Apr 10, 1961  Transition of Care [facility]) CM/SW Contact:  Ruthe Mannan, LCSWA Phone Number: 02/10/2019, 10:13 AM   Clinical Narrative: Per MD, patient will need transfer to [facility] today and has been accepted. CSW notified [name] at [employer] of transfer today. RN will complete transfer to [facility] today.     Final next level of care: Skilled Nursing Facility Barriers to Discharge: No Barriers Identified   Patient Goals and CMS Choice        Discharge Placement   Existing PASRR number confirmed : 02/09/19          Patient chooses bed at: [facility] Patient to be transferred to facility by: EMS   Patient and family notified of of transfer: 02/10/19  Discharge Plan and Services                                     Social Determinants of Health (SDOH) Interventions     Readmission Risk Interventions No flowsheet data found.

## 2019-02-10 NOTE — Progress Notes (Signed)
EMTALA form completed. Bo Mcclintock, RN

## 2019-02-10 NOTE — Progress Notes (Signed)
[employer] called and TLSO brace ordered

## 2019-02-10 NOTE — Progress Notes (Signed)
At 0149,  Pt was noted to have removed his CVC single lumen line 24cm in length from right subclavian area. No bleeding noted. Area was dry and clean. Pt simply stated that he removed it because, 'it needed to come out.'

## 2019-02-10 NOTE — Progress Notes (Signed)
[facility], [place] 02/10/19  Subjective:  Patient resting comfortably in bed. Creatinine down to 0.95. Potassium remains normal at 4.4.   Objective:  Vital signs in last 24 hours:  Temp:  [98.4 F (36.9 C)-98.8 F (37.1 C)] 98.4 F (36.9 C) (05/19 0923) Pulse Rate:  [74-82] 74 (05/19 0923) Resp:  [16-20] 17 (05/19 0923) BP: (112-140)/(69-78) 129/69 (05/19 0923) SpO2:  [97 %-100 %] 97 % (05/19 0923) Weight:  [106 kg] 106 kg (05/18 1952)  Weight change:  Filed Weights   02/06/19 2326 02/09/19 1952  Weight: 106 kg 106 kg    Intake/Output:    Intake/Output Summary (Last 24 hours) at 02/10/2019 1113 Last data filed at 02/10/2019 0454 Gross per 24 hour  Intake -  Output 2150 ml  Net -2150 ml   Physical Exam: General:  Chronically ill-appearing gentleman, laying in the bed  HEENT  anicteric, moist oral mucous membranes  Neck:  Supple  Lungs:  Normal breathing effort, clear to auscultation  Heart::  Regular rhythm, soft systolic murmur  Abdomen:  Soft, nontender, nondistended  Extremities:  Bilateral below the knee amputation, no edema  Neurologic:  Alert, able to answer questions  Skin:  No acute rashes  Access:  Removed  Foley:  Catheter in place     Basic Metabolic Panel:  Recent Labs  Lab 02/06/19 2317 02/07/19 0342 02/07/19 1111 02/07/19 1830 02/08/19 0003 02/09/19 0638 02/10/19 0336  NA 137 137  --   --  138 136 139  K 7.5* 6.7* 6.7* 6.5* 6.5* 4.9 4.4  CL 114* 116*  --   --  114* 114* 116*  CO2 14* 15*  --   --  18* 15* 17*  GLUCOSE 149* 169*  --   --  153* 176* 153*  BUN 93* 86*  --   --  77* 58* 50*  CREATININE 2.93* 2.54*  --   --  1.70* 1.17 0.95  CALCIUM 9.1 9.3  --   --  9.9 9.9 9.8  MG  --   --   --   --  2.1 1.8 2.0     CBC: Recent Labs  Lab 02/06/19 2317 02/07/19 0342 02/08/19 0003 02/09/19 0638 02/10/19 0336  WBC 7.5 7.5 5.9 6.8 6.3  NEUTROABS 5.2  --   --   --   --   HGB 8.4* 8.1* 8.8* 8.3* 7.4*   HCT 27.5* 25.7* 28.4* 26.1* 23.2*  MCV 92.9 93.8 93.7 91.6 89.9  PLT 279 255 269 243 240     No results found for: HEPBSAG, HEPBSAB, HEPBIGM    Microbiology:  Recent Results (from the past 240 hour(s))  SARS Coronavirus 2 (CEPHEID - Performed in [facility] hospital lab), Hosp Order     Status: None   Collection Time: 02/02/19 11:36 AM  Result Value Ref Range Status   SARS Coronavirus 2 NEGATIVE NEGATIVE Final    Comment: (NOTE) If result is NEGATIVE SARS-CoV-2 target nucleic acids are NOT DETECTED. The SARS-CoV-2 RNA is generally detectable in upper and lower  respiratory specimens during the acute phase of infection. The lowest  concentration of SARS-CoV-2 viral copies this assay can detect is 250  copies / mL. A negative result does not preclude SARS-CoV-2 infection  and should not be used as the sole basis for treatment or other  patient management decisions.  A negative result may occur with  improper specimen collection / handling, submission of specimen other  than nasopharyngeal swab, presence of  viral mutation(s) within the  areas targeted by this assay, and inadequate number of viral copies  (<250 copies / mL). A negative result must be combined with clinical  observations, patient history, and epidemiological information. If result is POSITIVE SARS-CoV-2 target nucleic acids are DETECTED. The SARS-CoV-2 RNA is generally detectable in upper and lower  respiratory specimens dur ing the acute phase of infection.  Positive  results are indicative of active infection with SARS-CoV-2.  Clinical  correlation with patient history and other diagnostic information is  necessary to determine patient infection status.  Positive results do  not rule out bacterial infection or co-infection with other viruses. If result is PRESUMPTIVE POSTIVE SARS-CoV-2 nucleic acids MAY BE PRESENT.   A presumptive positive result was obtained on the submitted specimen  and confirmed on repeat  testing.  While 2019 novel coronavirus  (SARS-CoV-2) nucleic acids may be present in the submitted sample  additional confirmatory testing may be necessary for epidemiological  and / or clinical management purposes  to differentiate between  SARS-CoV-2 and other Sarbecovirus currently known to infect humans.  If clinically indicated additional testing with an alternate test  methodology [phone]) is advised. The SARS-CoV-2 RNA is generally  detectable in upper and lower respiratory sp ecimens during the acute  phase of infection. The expected result is Negative. Fact Sheet for Patients:  BoilerBrush.com.cy Fact Sheet for Healthcare Providers: https://pope.com/ This test is not yet approved or cleared by the [place] FDA and has been authorized for detection and/or diagnosis of SARS-CoV-2 by FDA under an Emergency Use Authorization (EUA).  This EUA will remain in effect (meaning this test can be used) for the duration of the COVID-19 declaration under Section 564(b)(1) of the Act, 21 U.S.C. section 360bbb-3(b)(1), unless the authorization is terminated or revoked sooner. Performed at [facility], [address] [place], [place] [address]   Blood Culture (routine x 2)     Status: None (Preliminary result)   Collection Time: 02/06/19 11:50 PM  Result Value Ref Range Status   Specimen Description BLOOD LEFT UPPER ARM  Final   Special Requests   Final    BOTTLES DRAWN AEROBIC AND ANAEROBIC Blood Culture adequate volume   Culture   Final    NO GROWTH 3 DAYS Performed at [facility], [address], [place], [place] [address]    Report Status PENDING  Incomplete  Blood Culture (routine x 2)     Status: None (Preliminary result)   Collection Time: 02/06/19 11:50 PM  Result Value Ref Range Status   Specimen Description BLOOD LEFT HAND  Final   Special Requests   Final    BOTTLES DRAWN AEROBIC AND ANAEROBIC Blood  Culture results may not be optimal due to an excessive volume of blood received in culture bottles   Culture   Final    NO GROWTH 3 DAYS Performed at [facility], [address] [place], [place] [address]    Report Status PENDING  Incomplete  SARS Coronavirus 2 (CEPHEID - Performed in [facility] hospital lab), Hosp Order     Status: None   Collection Time: 02/06/19 11:50 PM  Result Value Ref Range Status   SARS Coronavirus 2 NEGATIVE NEGATIVE Final    Comment: (NOTE) If result is NEGATIVE SARS-CoV-2 target nucleic acids are NOT DETECTED. The SARS-CoV-2 RNA is generally detectable in upper and lower  respiratory specimens during the acute phase of infection. The lowest  concentration of SARS-CoV-2 viral copies this assay  can detect is 250  copies / mL. A negative result does not preclude SARS-CoV-2 infection  and should not be used as the sole basis for treatment or other  patient management decisions.  A negative result may occur with  improper specimen collection / handling, submission of specimen other  than nasopharyngeal swab, presence of viral mutation(s) within the  areas targeted by this assay, and inadequate number of viral copies  (<250 copies / mL). A negative result must be combined with clinical  observations, patient history, and epidemiological information. If result is POSITIVE SARS-CoV-2 target nucleic acids are DETECTED. The SARS-CoV-2 RNA is generally detectable in upper and lower  respiratory specimens dur ing the acute phase of infection.  Positive  results are indicative of active infection with SARS-CoV-2.  Clinical  correlation with patient history and other diagnostic information is  necessary to determine patient infection status.  Positive results do  not rule out bacterial infection or co-infection with other viruses. If result is PRESUMPTIVE POSTIVE SARS-CoV-2 nucleic acids MAY BE PRESENT.   A presumptive positive result was obtained on the  submitted specimen  and confirmed on repeat testing.  While 2019 novel coronavirus  (SARS-CoV-2) nucleic acids may be present in the submitted sample  additional confirmatory testing may be necessary for epidemiological  and / or clinical management purposes  to differentiate between  SARS-CoV-2 and other Sarbecovirus currently known to infect humans.  If clinically indicated additional testing with an alternate test  methodology [phone]) is advised. The SARS-CoV-2 RNA is generally  detectable in upper and lower respiratory sp ecimens during the acute  phase of infection. The expected result is Negative. Fact Sheet for Patients:  BoilerBrush.com.cyhttps://www.fda.gov/media/136312/download Fact Sheet for Healthcare Providers: https://pope.com/https://www.fda.gov/media/136313/download This test is not yet approved or cleared by the [place] FDA and has been authorized for detection and/or diagnosis of SARS-CoV-2 by FDA under an Emergency Use Authorization (EUA).  This EUA will remain in effect (meaning this test can be used) for the duration of the COVID-19 declaration under Section 564(b)(1) of the Act, 21 U.S.C. section 360bbb-3(b)(1), unless the authorization is terminated or revoked sooner. Performed at [facility], [address] [place], [place] [address]   Urine Culture     Status: Abnormal   Collection Time: 02/06/19 11:50 PM  Result Value Ref Range Status   Specimen Description   Final    URINE, RANDOM Performed at [facility], [address] [place], [place] [address]    Special Requests   Final    NONE Performed at [facility], [address] [place], [place] [address]    Culture 30,000 COLONIES/mL YEAST (A)  Final   Report Status 02/08/2019 FINAL  Final  MRSA PCR Screening     Status: None   Collection Time: 02/07/19  3:41 AM  Result Value Ref Range Status   MRSA by PCR NEGATIVE NEGATIVE Final    Comment:        The GeneXpert MRSA Assay (FDA approved for  NASAL specimens only), is one component of a comprehensive MRSA colonization surveillance program. It is not intended to diagnose MRSA infection nor to guide or monitor treatment for MRSA infections. Performed at [facility], [address] [place], [place] [address]     Coagulation Studies: No results for input(s): LABPROT, INR in the last 72 hours.  Urinalysis: No results for input(s): COLORURINE, LABSPEC, PHURINE, GLUCOSEU, HGBUR, BILIRUBINUR, KETONESUR, PROTEINUR, UROBILINOGEN, NITRITE, LEUKOCYTESUR in the last 72 hours.  Invalid input(s): APPERANCEUR    Imaging: Dg Orbits  Result Date: 02/09/2019 CLINICAL DATA:  Screening for MRI EXAM: ORBITS - COMPLETE 4+ VIEW COMPARISON:  None. FINDINGS: There is no evidence of metallic foreign body within the orbits. No significant bone abnormality identified. IMPRESSION: No evidence of metallic foreign body within the orbits. Electronically Signed   By: Charlett Nose M.D.   On: 02/09/2019 15:18   Dg Chest 1 View  Result Date: 02/09/2019 CLINICAL DATA:  Unknown tetanus immunization status.  For  MRI. EXAM: CHEST  1 VIEW COMPARISON:  None. FINDINGS: Right jugular central venous catheter tip at the cavoatrial junction. No pneumothorax. Lungs are clear without infiltrate or effusion. No implanted device IMPRESSION: Central line at the cavoatrial junction.  No acute abnormality. Electronically Signed   By: Marlan Palau M.D.   On: 02/09/2019 15:28   Dg Pelvis 1-2 Views  Result Date: 02/09/2019 CLINICAL DATA:  Screening for MRI EXAM: PELVIS - 1-2 VIEW COMPARISON:  None. FINDINGS: No visible unexpected radiopaque foreign body. Hip joints and SI joints are symmetric. No acute bony abnormality. Specifically, no fracture, subluxation, or dislocation. IMPRESSION: No radiopaque foreign body.  No acute bony abnormality. Electronically Signed   By: Charlett Nose M.D.   On: 02/09/2019 15:19   Dg Abd 1 View  Result Date: 02/09/2019 CLINICAL  DATA:  Unknown tetanus immunization status EXAM: ABDOMEN - 1 VIEW COMPARISON:  None FINDINGS: Distended colon diffusely. Moderate amount of stool in the colon. No small bowel dilatation. No acute skeletal abnormality.  No implant or metallic foreign body. IMPRESSION: Colonic ileus. Electronically Signed   By: Marlan Palau M.D.   On: 02/09/2019 15:27   Mr Thoracic Spine Wo Contrast  Result Date: 02/09/2019 CLINICAL DATA:  58 y/o M; T8-T9 vertebral osteomyelitis post laminectomy 12/22/2018 for follow-up. EXAM: MRI THORACIC SPINE WITHOUT CONTRAST TECHNIQUE: Multiplanar, multisequence MR imaging of the thoracic spine was performed. No intravenous contrast was administered. COMPARISON:  None. FINDINGS: Severely motion degraded study. Alignment: T8-9 grade 1 retrolisthesis and focal kyphosis due to partial collapse of the vertebral bodies and loss of the intervertebral disc space. Vertebrae: T8-9 discitis osteomyelitis. Diffuse low T1 signal and increased T2 signal throughout the T8 and T9 vertebral bodies extending into the transverse and posterior elements. There is approximately 50% loss of height of the T8 vertebral body and deformity of the superior endplate of the T9 vertebral body with 10-20% loss of height. Associated focal kyphosis and up to 7 mm of bony retropulsion of T8 impinge the spinal cord which demonstrates cord edema (series 23, image 10). There is a dorsal epidural abscess that extends superiorly to the T5 level. The epidural collection is in communication with a multiloculated collection within the dorsal subcutaneous fat and paraspinal muscles extending from T6-T11. The 2 collections appear to communicate through a fluid collection within the left T8-9 facet joint (series 23, image 10). There may be a small ventral epidural abscess posterior to the T9 vertebral body as well. 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 Paraspinal and other soft tissues: Extensive edema within the paravertebral soft tissues as well as paraspinal muscles and dorsal subcutaneous fat. Small anterior paraspinal collection are present at the T8-9 intervertebral disc level bilaterally measuring up to 18 mm in the right and 10 mm on the left (series 24, image 26). Disc levels: No significant spinal canal stenosis or foraminal stenosis at additional thoracic  spinal levels. IMPRESSION: Severe motion degraded study. 1. T8-9 discitis osteomyelitis. 50% loss of height of T8 vertebral body and deformity of T9 superior endplate with 10-20% loss of vertebral body height. 2. Focal kyphosis and 7 mm bony retropulsion of T8 vertebral body impinge the spinal cord. 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 3. Dorsal epidural abscess extends superiorly to T5 level. Small ventral epidural abscess may be present posterior to T9 vertebral body. 4. Dorsal epidural abscess appears to communicate with a collection in the left T8-9 facet and multiloculated collection within dorsal soft tissues. 5. Small anterior paraspinal collections at the T8-9 disc level measuring 18 mm on right and 10 mm on the left. These results will be called to the ordering clinician or representative by the Radiologist Assistant, and communication documented in the PACS or zVision Dashboard. Electronically Signed   By: Mitzi Hansen M.D.   On: 02/09/2019 19:51     Medications:    . amLODipine  10 mg Oral Daily  . heparin  5,000 Units Subcutaneous Q8H  . hydrALAZINE  50 mg Oral TID  . insulin aspart  0-5 Units Subcutaneous QHS  . insulin aspart  0-9 Units Subcutaneous TID WC  . insulin glargine  20 Units Subcutaneous QHS  . methadone  10 mg Oral BID  . metoprolol succinate  25 mg Oral Daily  . pravastatin  40 mg Oral Daily  . sodium bicarbonate  1,300 mg Oral BID  . sodium polystyrene  30 g Oral Once  . traZODone  150 mg Oral QHS    acetaminophen **OR** acetaminophen, bisacodyl, ondansetron **OR** ondansetron (ZOFRAN) IV, oxyCODONE  Assessment/ Plan:  58 y.o. male with   Principal Problem:   UTI (urinary tract infection) Active Problems:   AKI (acute kidney injury) (HCC)   Hyperkalemia   Diabetes (HCC)   Pressure injury of skin   MRSA bacteremia   Osteomyelitis of thoracic spine (HCC)   Peripheral vascular disease (HCC)   Hypertension   Dyslipidemia   Chronic pain syndrome   Polysubstance abuse (HCC)   Chronic hepatitis C without hepatic coma (HCC)   GERD (gastroesophageal reflux disease)   Cigarette smoker   Normocytic anemia   #.  Acute kidney injury Baseline creatinine of 1.2/GFR 65 from 01/08/2019 noted in care everywhere Vancomycin level of 24.5 noted on the same day Acute kidney injury likely secondary to toxicity from high vancomycin levels. Other contributing conditions include use of high-dose ibuprofen 800 mg 3 times per day,, ACE inhibitor lisinopril 20 mg daily, furosemide 40 mg twice a day -Renal function appears to have normalized.  Creatinine down to 0.95.  Continue to periodically monitor.  #. Anemia of CKD  Lab Results  Component Value Date   HGB 7.4 (L) 02/10/2019  Hemoglobin down to 7.4.  Continue to monitor closely.  Consider blood transfusion as necessary.   #.  Insulin-dependent diabetes with proteinuria, diabetic retinopathy Hgb A1c MFr Bld (%)  Date Value  02/08/2019 6.3 (H)  A1c currently at target.   # Severe hyperkalemia Likely iatrogenic.  Patient was on potassium supplements as outpatient which likely got worse due to acute kidney injury -We will go ahead and discontinue Lokelma as potassium is stabilized at 4.4.  Monitor potassium.   LOS: 3 Lind Ausley 5/19/202011:13 AM  [facility] [place], [place] [phone]

## 2019-02-12 DIAGNOSIS — M4804 Spinal stenosis, thoracic region: Secondary | ICD-10-CM | POA: Insufficient documentation

## 2019-02-12 LAB — CULTURE, BLOOD (ROUTINE X 2)
Culture: NO GROWTH
Culture: NO GROWTH
Special Requests: ADEQUATE

## 2019-02-26 ENCOUNTER — Inpatient Hospital Stay
Admission: RE | Admit: 2019-02-26 | Discharge: 2019-03-04 | DRG: 872 | Disposition: A | Discharge status: Skilled Nursing Facility | Payer: Medicaid Other | Source: Other Acute Inpatient Hospital | Attending: Internal Medicine | Admitting: Internal Medicine

## 2019-02-26 ENCOUNTER — Other Ambulatory Visit: Payer: Self-pay

## 2019-02-26 DIAGNOSIS — R748 Abnormal levels of other serum enzymes: Secondary | ICD-10-CM | POA: Diagnosis present

## 2019-02-26 DIAGNOSIS — Z89512 Acquired absence of left leg below knee: Secondary | ICD-10-CM

## 2019-02-26 DIAGNOSIS — Z89511 Acquired absence of right leg below knee: Secondary | ICD-10-CM | POA: Diagnosis not present

## 2019-02-26 DIAGNOSIS — E1169 Type 2 diabetes mellitus with other specified complication: Secondary | ICD-10-CM | POA: Diagnosis not present

## 2019-02-26 DIAGNOSIS — Z1159 Encounter for screening for other viral diseases: Secondary | ICD-10-CM

## 2019-02-26 DIAGNOSIS — T368X5A Adverse effect of other systemic antibiotics, initial encounter: Secondary | ICD-10-CM | POA: Diagnosis present

## 2019-02-26 DIAGNOSIS — A4102 Sepsis due to Methicillin resistant Staphylococcus aureus: Principal | ICD-10-CM | POA: Diagnosis present

## 2019-02-26 DIAGNOSIS — Z8614 Personal history of Methicillin resistant Staphylococcus aureus infection: Secondary | ICD-10-CM

## 2019-02-26 DIAGNOSIS — G959 Disease of spinal cord, unspecified: Secondary | ICD-10-CM | POA: Diagnosis present

## 2019-02-26 DIAGNOSIS — Z981 Arthrodesis status: Secondary | ICD-10-CM

## 2019-02-26 DIAGNOSIS — I10 Essential (primary) hypertension: Secondary | ICD-10-CM | POA: Diagnosis present

## 2019-02-26 DIAGNOSIS — F17210 Nicotine dependence, cigarettes, uncomplicated: Secondary | ICD-10-CM | POA: Diagnosis present

## 2019-02-26 DIAGNOSIS — M4644 Discitis, unspecified, thoracic region: Secondary | ICD-10-CM

## 2019-02-26 DIAGNOSIS — R809 Proteinuria, unspecified: Secondary | ICD-10-CM | POA: Diagnosis present

## 2019-02-26 DIAGNOSIS — Z8619 Personal history of other infectious and parasitic diseases: Secondary | ICD-10-CM | POA: Diagnosis not present

## 2019-02-26 DIAGNOSIS — F1990 Other psychoactive substance use, unspecified, uncomplicated: Secondary | ICD-10-CM | POA: Diagnosis not present

## 2019-02-26 DIAGNOSIS — G894 Chronic pain syndrome: Secondary | ICD-10-CM | POA: Diagnosis present

## 2019-02-26 DIAGNOSIS — Z79899 Other long term (current) drug therapy: Secondary | ICD-10-CM | POA: Diagnosis not present

## 2019-02-26 DIAGNOSIS — E1142 Type 2 diabetes mellitus with diabetic polyneuropathy: Secondary | ICD-10-CM | POA: Diagnosis present

## 2019-02-26 DIAGNOSIS — M549 Dorsalgia, unspecified: Secondary | ICD-10-CM | POA: Diagnosis present

## 2019-02-26 DIAGNOSIS — D638 Anemia in other chronic diseases classified elsewhere: Secondary | ICD-10-CM | POA: Diagnosis not present

## 2019-02-26 DIAGNOSIS — M6282 Rhabdomyolysis: Secondary | ICD-10-CM | POA: Diagnosis present

## 2019-02-26 DIAGNOSIS — B9562 Methicillin resistant Staphylococcus aureus infection as the cause of diseases classified elsewhere: Secondary | ICD-10-CM

## 2019-02-26 DIAGNOSIS — Z8739 Personal history of other diseases of the musculoskeletal system and connective tissue: Secondary | ICD-10-CM | POA: Diagnosis not present

## 2019-02-26 DIAGNOSIS — G061 Intraspinal abscess and granuloma: Secondary | ICD-10-CM | POA: Diagnosis not present

## 2019-02-26 DIAGNOSIS — Z792 Long term (current) use of antibiotics: Secondary | ICD-10-CM

## 2019-02-26 DIAGNOSIS — M4624 Osteomyelitis of vertebra, thoracic region: Secondary | ICD-10-CM | POA: Diagnosis present

## 2019-02-26 DIAGNOSIS — R7881 Bacteremia: Secondary | ICD-10-CM | POA: Diagnosis not present

## 2019-02-26 DIAGNOSIS — M40204 Unspecified kyphosis, thoracic region: Secondary | ICD-10-CM | POA: Diagnosis present

## 2019-02-26 DIAGNOSIS — R339 Retention of urine, unspecified: Secondary | ICD-10-CM | POA: Diagnosis not present

## 2019-02-26 DIAGNOSIS — Z794 Long term (current) use of insulin: Secondary | ICD-10-CM

## 2019-02-26 LAB — COMPREHENSIVE METABOLIC PANEL
ALT: 49 U/L — ABNORMAL HIGH (ref 0–44)
AST: 109 U/L — ABNORMAL HIGH (ref 15–41)
Albumin: 2.6 g/dL — ABNORMAL LOW (ref 3.5–5.0)
Alkaline Phosphatase: 135 U/L — ABNORMAL HIGH (ref 38–126)
Anion gap: 9 (ref 5–15)
BUN: 38 mg/dL — ABNORMAL HIGH (ref 6–20)
CO2: 23 mmol/L (ref 22–32)
Calcium: 8.3 mg/dL — ABNORMAL LOW (ref 8.9–10.3)
Chloride: 100 mmol/L (ref 98–111)
Creatinine, Ser: 1.23 mg/dL (ref 0.61–1.24)
GFR calc Af Amer: >60 mL/min (ref >60)
GFR calc non Af Amer: >60 mL/min (ref >60)
Glucose, Bld: 189 mg/dL — ABNORMAL HIGH (ref 70–99)
Potassium: 5.6 mmol/L — ABNORMAL HIGH (ref 3.5–5.1)
Sodium: 132 mmol/L — ABNORMAL LOW (ref 135–145)
Total Bilirubin: 0.4 mg/dL (ref 0.3–1.2)
Total Protein: 7.2 g/dL (ref 6.5–8.1)

## 2019-02-26 LAB — TSH: TSH: 9.814 u[IU]/mL — ABNORMAL HIGH (ref 0.350–4.500)

## 2019-02-26 LAB — CBC WITH DIFFERENTIAL/PLATELET
Abs Immature Granulocytes: 0.05 10*3/uL (ref 0.00–0.07)
Basophils Absolute: 0.1 10*3/uL (ref 0.0–0.1)
Basophils Relative: 1 %
Eosinophils Absolute: 0.2 10*3/uL (ref 0.0–0.5)
Eosinophils Relative: 4 %
HCT: 23.9 % — ABNORMAL LOW (ref 39.0–52.0)
Hemoglobin: 7.6 g/dL — ABNORMAL LOW (ref 13.0–17.0)
Immature Granulocytes: 1 %
Lymphocytes Relative: 31 %
Lymphs Abs: 1.9 10*3/uL (ref 0.7–4.0)
MCH: 29 pg (ref 26.0–34.0)
MCHC: 31.8 g/dL (ref 30.0–36.0)
MCV: 91.2 fL (ref 80.0–100.0)
Monocytes Absolute: 0.6 10*3/uL (ref 0.1–1.0)
Monocytes Relative: 9 %
Neutro Abs: 3.3 10*3/uL (ref 1.7–7.7)
Neutrophils Relative %: 54 %
Platelets: 298 10*3/uL (ref 150–400)
RBC: 2.62 MIL/uL — ABNORMAL LOW (ref 4.22–5.81)
RDW: 14 % (ref 11.5–15.5)
WBC: 6.1 10*3/uL (ref 4.0–10.5)
nRBC: 0 % (ref 0.0–0.2)

## 2019-02-26 LAB — APTT: aPTT: 36 seconds (ref 24–36)

## 2019-02-26 LAB — PROTIME-INR
INR: 1.1 (ref 0.8–1.2)
Prothrombin Time: 14 seconds (ref 11.4–15.2)

## 2019-02-26 LAB — GLUCOSE, CAPILLARY
Glucose-Capillary: 139 mg/dL — ABNORMAL HIGH (ref 70–99)
Glucose-Capillary: 198 mg/dL — ABNORMAL HIGH (ref 70–99)
Glucose-Capillary: 245 mg/dL — ABNORMAL HIGH (ref 70–99)
Glucose-Capillary: 252 mg/dL — ABNORMAL HIGH (ref 70–99)

## 2019-02-26 LAB — MAGNESIUM: Magnesium: 2.4 mg/dL (ref 1.7–2.4)

## 2019-02-26 LAB — CK: Total CK: 5908 U/L — ABNORMAL HIGH (ref 49–397)

## 2019-02-26 LAB — POTASSIUM: Potassium: 5.6 mmol/L — ABNORMAL HIGH (ref 3.5–5.1)

## 2019-02-26 MED ORDER — BISACODYL 5 MG PO TBEC
10.0000 mg | DELAYED_RELEASE_TABLET | Freq: Every day | ORAL | Status: DC
  Administered 2019-02-26 – 2019-03-04 (×6): 10 mg via ORAL
  Filled 2019-02-26 (×6): qty 2

## 2019-02-26 MED ORDER — DOCUSATE SODIUM 100 MG PO CAPS
100.0000 mg | ORAL_CAPSULE | Freq: Two times a day (BID) | ORAL | Status: DC
  Administered 2019-02-26 – 2019-03-04 (×11): 100 mg via ORAL
  Filled 2019-02-26 (×12): qty 1

## 2019-02-26 MED ORDER — TRAZODONE HCL 50 MG PO TABS
150.0000 mg | ORAL_TABLET | Freq: Every day | ORAL | Status: DC
  Administered 2019-02-26 – 2019-03-03 (×6): 150 mg via ORAL
  Filled 2019-02-26 (×6): qty 1

## 2019-02-26 MED ORDER — FAMOTIDINE 20 MG PO TABS
20.0000 mg | ORAL_TABLET | Freq: Two times a day (BID) | ORAL | Status: DC
  Administered 2019-02-26 – 2019-03-04 (×13): 20 mg via ORAL
  Filled 2019-02-26 (×13): qty 1

## 2019-02-26 MED ORDER — ACETAMINOPHEN 325 MG PO TABS: 650.0000 mg | ORAL_TABLET | ORAL | Status: DC | PRN

## 2019-02-26 MED ORDER — POLIBAR PO
20.00 | ORAL | Status: DC
Start: 2019-02-26 — End: 2019-02-26

## 2019-02-26 MED ORDER — FERROUS GLUCONATE 324 (38 FE) MG PO TABS
324.0000 mg | ORAL_TABLET | Freq: Two times a day (BID) | ORAL | Status: DC
  Administered 2019-02-26 – 2019-03-04 (×13): 324 mg via ORAL
  Filled 2019-02-26 (×14): qty 1

## 2019-02-26 MED ORDER — ONDANSETRON HCL 4 MG PO TABS: 4.0000 mg | ORAL_TABLET | Freq: Four times a day (QID) | ORAL | Status: DC | PRN

## 2019-02-26 MED ORDER — LISINOPRIL 20 MG PO TABS: 20.0000 mg | ORAL_TABLET | Freq: Every day | ORAL | Status: DC

## 2019-02-26 MED ORDER — ONDANSETRON HCL 4 MG/2ML IJ SOLN: 4.0000 mg | Freq: Four times a day (QID) | INTRAMUSCULAR | Status: DC | PRN

## 2019-02-26 MED ORDER — INSULIN LISPRO 100 UNIT/ML ~~LOC~~ SOLN
0.00 | SUBCUTANEOUS | Status: DC
Start: 2019-02-26 — End: 2019-02-26

## 2019-02-26 MED ORDER — AMLODIPINE BESYLATE 10 MG PO TABS
10.00 | ORAL_TABLET | ORAL | Status: DC
Start: 2019-02-26 — End: 2019-02-26

## 2019-02-26 MED ORDER — SODIUM CHLORIDE 0.9 % IV SOLN: 900.0000 mg | Freq: Every day | INTRAVENOUS | Status: DC

## 2019-02-26 MED ORDER — INSULIN GLARGINE 100 UNIT/ML ~~LOC~~ SOLN
20.0000 [IU] | Freq: Every day | SUBCUTANEOUS | Status: DC
  Administered 2019-02-26 – 2019-03-03 (×6): 20 [IU] via SUBCUTANEOUS
  Filled 2019-02-26 (×7): qty 0.2

## 2019-02-26 MED ORDER — COMPOUND W FREEZE OFF EX AERO
4.00 | INHALATION_SPRAY | CUTANEOUS | Status: DC
Start: 2019-02-26 — End: 2019-02-26

## 2019-02-26 MED ORDER — Medication
200.00 | Status: DC
Start: 2019-02-26 — End: 2019-02-26

## 2019-02-26 MED ORDER — PATIROMER SORBITEX CALCIUM 8.4 G PO PACK
8.4000 g | PACK | Freq: Once | ORAL | Status: AC
  Administered 2019-02-26: 8.4 g via ORAL
  Filled 2019-02-26 (×2): qty 1

## 2019-02-26 MED ORDER — GENERIC EXTERNAL MEDICATION
150.00 | Status: DC
Start: 2019-02-26 — End: 2019-02-26

## 2019-02-26 MED ORDER — MAGNESIUM HYDROXIDE 400 MG/5ML PO SUSP
30.00 | ORAL | Status: DC
Start: ? — End: 2019-02-26

## 2019-02-26 MED ORDER — INSULIN GLARGINE 100 UNIT/ML ~~LOC~~ SOLN
20.00 | SUBCUTANEOUS | Status: DC
Start: 2019-02-26 — End: 2019-02-26

## 2019-02-26 MED ORDER — LIDOCAINE 5 % EX PTCH
2.00 | MEDICATED_PATCH | CUTANEOUS | Status: DC
Start: 2019-02-26 — End: 2019-02-26

## 2019-02-26 MED ORDER — GENERIC EXTERNAL MEDICATION
240.00 | Status: DC
Start: ? — End: 2019-02-26

## 2019-02-26 MED ORDER — FAMOTIDINE 20 MG PO TABS
20.00 | ORAL_TABLET | ORAL | Status: DC
Start: 2019-02-26 — End: 2019-02-26

## 2019-02-26 MED ORDER — GENERIC EXTERNAL MEDICATION
Status: DC
Start: ? — End: 2019-02-26

## 2019-02-26 MED ORDER — HYDROCODONE-ACETAMINOPHEN 5-325 MG PO TABS
1.0000 | ORAL_TABLET | ORAL | Status: DC | PRN
  Administered 2019-02-27 – 2019-03-01 (×2): 2 via ORAL
  Filled 2019-02-26 (×3): qty 2

## 2019-02-26 MED ORDER — ENOXAPARIN SODIUM 40 MG/0.4ML ~~LOC~~ SOLN
40.00 | SUBCUTANEOUS | Status: DC
Start: 2019-02-26 — End: 2019-02-26

## 2019-02-26 MED ORDER — TRAZODONE HCL 50 MG PO TABS
25.0000 mg | ORAL_TABLET | Freq: Every evening | ORAL | Status: DC | PRN
  Filled 2019-02-26: qty 1

## 2019-02-26 MED ORDER — DICLOXACILLIN SODIUM 62.5 MG/5ML PO SUSR
10.00 | ORAL | Status: DC
Start: ? — End: 2019-02-26

## 2019-02-26 MED ORDER — DEXTROSE 10 % IV SOLN
12.50 | INTRAVENOUS | Status: DC
Start: ? — End: 2019-02-26

## 2019-02-26 MED ORDER — METHOCARBAMOL 500 MG PO TABS
1000.0000 mg | ORAL_TABLET | Freq: Four times a day (QID) | ORAL | Status: DC
  Administered 2019-02-26 – 2019-03-04 (×26): 1000 mg via ORAL
  Filled 2019-02-26 (×26): qty 2

## 2019-02-26 MED ORDER — HYDRALAZINE HCL 50 MG PO TABS
50.0000 mg | ORAL_TABLET | Freq: Three times a day (TID) | ORAL | Status: DC
  Administered 2019-02-26 – 2019-03-04 (×17): 50 mg via ORAL
  Filled 2019-02-26 (×17): qty 1

## 2019-02-26 MED ORDER — GLYCERIN 50 % PO SOLN
ORAL | Status: DC
Start: 2019-02-26 — End: 2019-02-26

## 2019-02-26 MED ORDER — OXYCODONE HCL 5 MG PO TABS
10.0000 mg | ORAL_TABLET | ORAL | Status: DC | PRN
  Administered 2019-02-26 – 2019-03-04 (×26): 10 mg via ORAL
  Filled 2019-02-26 (×26): qty 2

## 2019-02-26 MED ORDER — INSULIN LISPRO 100 UNIT/ML ~~LOC~~ SOLN: 0.0000 [IU] | Freq: Three times a day (TID) | SUBCUTANEOUS | Status: DC

## 2019-02-26 MED ORDER — GENERIC EXTERNAL MEDICATION
900.00 | Status: DC
Start: 2019-02-26 — End: 2019-02-26

## 2019-02-26 MED ORDER — DAPTOMYCIN IV (FOR PTA / DISCHARGE USE ONLY): 900.0000 mg | INTRAVENOUS | Status: DC

## 2019-02-26 MED ORDER — FERROUS SULFATE 325 (65 FE) MG PO TABS
162.50 | ORAL_TABLET | ORAL | Status: DC
Start: 2019-02-26 — End: 2019-02-26

## 2019-02-26 MED ORDER — ALBUTEROL SULFATE (2.5 MG/3ML) 0.083% IN NEBU
2.50 | INHALATION_SOLUTION | RESPIRATORY_TRACT | Status: DC
Start: ? — End: 2019-02-26

## 2019-02-26 MED ORDER — MAVIK 1 MG PO TABS
1.00 | ORAL_TABLET | ORAL | Status: DC
Start: ? — End: 2019-02-26

## 2019-02-26 MED ORDER — MAGNESIUM HYDROXIDE 400 MG/5ML PO SUSP: 30.0000 mL | Freq: Every day | ORAL | Status: DC | PRN

## 2019-02-26 MED ORDER — BISACODYL 10 MG RE SUPP
10.0000 mg | Freq: Every day | RECTAL | Status: DC
  Administered 2019-03-04: 10 mg via RECTAL
  Filled 2019-02-26: qty 1

## 2019-02-26 MED ORDER — METHADONE HCL 10 MG PO TABS: 10.0000 mg | ORAL_TABLET | Freq: Two times a day (BID) | ORAL | Status: DC

## 2019-02-26 MED ORDER — RIFAMPIN 300 MG PO CAPS
600.0000 mg | ORAL_CAPSULE | Freq: Every day | ORAL | Status: DC
  Administered 2019-02-26 – 2019-03-04 (×7): 600 mg via ORAL
  Filled 2019-02-26 (×7): qty 2

## 2019-02-26 MED ORDER — LACTULOSE 10 GM/15ML PO SOLN
30.0000 g | Freq: Two times a day (BID) | ORAL | Status: DC
  Administered 2019-02-26 – 2019-03-04 (×13): 30 g via ORAL
  Filled 2019-02-26 (×13): qty 60

## 2019-02-26 MED ORDER — DIPHENHYDRAMINE HCL 50 MG PO CAPS
50.00 | ORAL_CAPSULE | ORAL | Status: DC
Start: ? — End: 2019-02-26

## 2019-02-26 MED ORDER — POLYETHYLENE GLYCOL 3350 17 G PO PACK
17.0000 g | PACK | Freq: Every day | ORAL | Status: DC
  Administered 2019-02-26 – 2019-03-04 (×3): 17 g via ORAL
  Filled 2019-02-26 (×6): qty 1

## 2019-02-26 MED ORDER — ACETAMINOPHEN 650 MG RE SUPP: 650.0000 mg | Freq: Four times a day (QID) | RECTAL | Status: DC | PRN

## 2019-02-26 MED ORDER — METHADONE HCL 10 MG PO TABS
10.00 | ORAL_TABLET | ORAL | Status: DC
Start: 2019-02-26 — End: 2019-02-26

## 2019-02-26 MED ORDER — PRAVASTATIN SODIUM 20 MG PO TABS: 40.0000 mg | ORAL_TABLET | Freq: Every day | ORAL | Status: DC

## 2019-02-26 MED ORDER — DIPHENHYDRAMINE HCL 25 MG PO CAPS
50.0000 mg | ORAL_CAPSULE | Freq: Four times a day (QID) | ORAL | Status: DC | PRN
  Administered 2019-02-26 – 2019-03-04 (×17): 50 mg via ORAL
  Filled 2019-02-26 (×17): qty 2

## 2019-02-26 MED ORDER — FUROSEMIDE 40 MG PO TABS
40.0000 mg | ORAL_TABLET | Freq: Two times a day (BID) | ORAL | Status: DC
  Administered 2019-02-26 – 2019-03-04 (×12): 40 mg via ORAL
  Filled 2019-02-26 (×12): qty 1

## 2019-02-26 MED ORDER — FLEET ENEMA 7-19 GM/118ML RE ENEM: 1.0000 | ENEMA | RECTAL | Status: DC | PRN

## 2019-02-26 MED ORDER — METOPROLOL SUCCINATE ER 25 MG PO TB24
25.0000 mg | ORAL_TABLET | Freq: Every day | ORAL | Status: DC
  Administered 2019-02-26 – 2019-03-04 (×5): 25 mg via ORAL
  Filled 2019-02-26 (×6): qty 1

## 2019-02-26 MED ORDER — ONDANSETRON HCL 4 MG/2ML IJ SOLN
4.00 | INTRAMUSCULAR | Status: DC
Start: ? — End: 2019-02-26

## 2019-02-26 MED ORDER — LINACLOTIDE 290 MCG PO CAPS
290.0000 ug | ORAL_CAPSULE | Freq: Every day | ORAL | Status: DC
  Administered 2019-02-26 – 2019-03-04 (×7): 290 ug via ORAL
  Filled 2019-02-26 (×7): qty 1

## 2019-02-26 MED ORDER — QUINERVA 260 MG PO TABS
650.00 | ORAL_TABLET | ORAL | Status: DC
Start: 2019-02-26 — End: 2019-02-26

## 2019-02-26 MED ORDER — ACETAMINOPHEN 325 MG PO TABS
650.00 | ORAL_TABLET | ORAL | Status: DC
Start: ? — End: 2019-02-26

## 2019-02-26 MED ORDER — HEPARIN SODIUM (PORCINE) 5000 UNIT/ML IJ SOLN
5000.0000 [IU] | Freq: Three times a day (TID) | INTRAMUSCULAR | Status: DC
  Administered 2019-02-26: 5000 [IU] via SUBCUTANEOUS
  Filled 2019-02-26: qty 1

## 2019-02-26 MED ORDER — FUROSEMIDE 40 MG PO TABS
40.00 | ORAL_TABLET | ORAL | Status: DC
Start: 2019-02-26 — End: 2019-02-26

## 2019-02-26 MED ORDER — ENOXAPARIN SODIUM 40 MG/0.4ML ~~LOC~~ SOLN: 40.0000 mg | SUBCUTANEOUS | Status: DC

## 2019-02-26 MED ORDER — RIFAMPIN 300 MG PO CAPS
600.00 | ORAL_CAPSULE | ORAL | Status: DC
Start: 2019-02-26 — End: 2019-02-26

## 2019-02-26 MED ORDER — POLYETHYLENE GLYCOL 3350 17 G PO PACK
17.00 | PACK | ORAL | Status: DC
Start: 2019-02-26 — End: 2019-02-26

## 2019-02-26 MED ORDER — LINEZOLID 600 MG/300ML IV SOLN
600.0000 mg | Freq: Two times a day (BID) | INTRAVENOUS | Status: DC
  Administered 2019-02-26 – 2019-03-04 (×12): 600 mg via INTRAVENOUS
  Filled 2019-02-26 (×14): qty 300

## 2019-02-26 MED ORDER — DOCUSATE SODIUM 100 MG PO CAPS
100.00 | ORAL_CAPSULE | ORAL | Status: DC
Start: 2019-02-26 — End: 2019-02-26

## 2019-02-26 MED ORDER — PREGABALIN 75 MG PO CAPS
75.0000 mg | ORAL_CAPSULE | Freq: Two times a day (BID) | ORAL | Status: DC
  Administered 2019-02-26 – 2019-03-04 (×13): 75 mg via ORAL
  Filled 2019-02-26 (×13): qty 1

## 2019-02-26 MED ORDER — ACETAMINOPHEN 325 MG PO TABS
650.0000 mg | ORAL_TABLET | Freq: Four times a day (QID) | ORAL | Status: DC | PRN
  Administered 2019-02-28: 650 mg via ORAL
  Filled 2019-02-26: qty 2

## 2019-02-26 MED ORDER — AMLODIPINE BESYLATE 10 MG PO TABS
10.0000 mg | ORAL_TABLET | Freq: Every day | ORAL | Status: DC
  Administered 2019-02-26 – 2019-03-04 (×6): 10 mg via ORAL
  Filled 2019-02-26 (×7): qty 1

## 2019-02-26 MED ORDER — METHOCARBAMOL 500 MG PO TABS
1000.00 | ORAL_TABLET | ORAL | Status: DC
Start: 2019-02-26 — End: 2019-02-26

## 2019-02-26 MED ORDER — HEPARIN SODIUM (PORCINE) 5000 UNIT/ML IJ SOLN
5000.0000 [IU] | Freq: Two times a day (BID) | INTRAMUSCULAR | Status: DC
  Administered 2019-02-26 – 2019-03-04 (×13): 5000 [IU] via SUBCUTANEOUS
  Filled 2019-02-26 (×13): qty 1

## 2019-02-26 MED ORDER — WRIST SPLINT/RIGHT YOUTH MISC
75.00 | Status: DC
Start: 2019-02-26 — End: 2019-02-26

## 2019-02-26 MED ORDER — INSULIN ASPART 100 UNIT/ML ~~LOC~~ SOLN
0.0000 [IU] | Freq: Three times a day (TID) | SUBCUTANEOUS | Status: DC
  Administered 2019-02-26: 9 [IU] via SUBCUTANEOUS
  Administered 2019-02-26: 3 [IU] via SUBCUTANEOUS
  Administered 2019-02-26 – 2019-02-27 (×2): 6 [IU] via SUBCUTANEOUS
  Administered 2019-02-27 (×2): 3 [IU] via SUBCUTANEOUS
  Administered 2019-02-27: 9 [IU] via SUBCUTANEOUS
  Administered 2019-02-28: 6 [IU] via SUBCUTANEOUS
  Administered 2019-02-28: 3 [IU] via SUBCUTANEOUS
  Administered 2019-02-28: 6 [IU] via SUBCUTANEOUS
  Administered 2019-03-01 – 2019-03-02 (×4): 3 [IU] via SUBCUTANEOUS
  Administered 2019-03-02: 6 [IU] via SUBCUTANEOUS
  Administered 2019-03-02: 3 [IU] via SUBCUTANEOUS
  Administered 2019-03-02 – 2019-03-03 (×2): 6 [IU] via SUBCUTANEOUS
  Administered 2019-03-03 (×2): 3 [IU] via SUBCUTANEOUS
  Administered 2019-03-04: 6 [IU] via SUBCUTANEOUS
  Administered 2019-03-04: 3 [IU] via SUBCUTANEOUS
  Filled 2019-02-26 (×22): qty 1

## 2019-02-26 MED ORDER — HEPARIN SOD (PORK) LOCK FLUSH 10 UNIT/ML IV SOLN: 50.0000 [IU] | Freq: Two times a day (BID) | INTRAVENOUS | Status: DC

## 2019-02-26 MED ORDER — HYDRALAZINE HCL 50 MG PO TABS
50.00 | ORAL_TABLET | ORAL | Status: DC
Start: 2019-02-26 — End: 2019-02-26

## 2019-02-26 MED ORDER — POTASSIUM CHLORIDE CRYS ER 20 MEQ PO TBCR: 20.0000 meq | EXTENDED_RELEASE_TABLET | Freq: Every day | ORAL | Status: DC

## 2019-02-26 MED ORDER — METOPROLOL SUCCINATE ER 25 MG PO TB24
25.00 | ORAL_TABLET | ORAL | Status: DC
Start: 2019-02-26 — End: 2019-02-26

## 2019-02-26 MED ORDER — SODIUM CHLORIDE 0.9 % IV SOLN
INTRAVENOUS | Status: DC
  Administered 2019-02-26 – 2019-02-28 (×6): via INTRAVENOUS

## 2019-02-26 NOTE — Progress Notes (Signed)
Received phone call from Dr. Georgina Snell, neurosurgeon at [facility] stating that patient is pod#[id number] today and will need his staples removed today. Passed this information to day shift RN Darien Ramus who is caring for patient today to get order from rounding MD.

## 2019-02-26 NOTE — H&P (Signed)
[facility] [place] [facility]   PATIENT NAME: Eugene [name]    MR#:  [id number]  DATE OF BIRTH:  June 06, 1961  DATE OF ADMISSION:  02/26/2019  PRIMARY CARE PHYSICIAN: Dimple Casey, MD   REQUESTING/REFERRING PHYSICIAN: Demetria Pore, MD  CHIEF COMPLAINT:  Back pain  HISTORY OF PRESENT ILLNESS:  Eugene [name]  is a 58 y.o. 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  He underwent T8-T9 transpedicular corpectomies and T5-T11 posterolateral arthrodesis, with T5-11 pedicle screw fixation and the T7-10 laminectomies with open reduction of pathological fractures, status post T8-T9 transpedicular corpectomies and T5-T11 posterolateral arthrodesis.Marland Kitchen  He tolerated the procedure well.  He will require long-term IV antibiotics and therefore he was transferred back here.  He will also require neurosurgical follow-up.  PAST MEDICAL HISTORY:   Past Medical History:  Diagnosis Date  . Diabetes mellitus without complication (HCC)   . Osteomyelitis (HCC)   MRSA septicemia, hypertension, tobacco abuse, uncontrolled type 2 diabetes mellitus, foot cellulitis, drug-seeking behavior, thoracic spinal stenosis, myelopathy of thoracic region, pressure injury of sacral region and HCV  PAST SURGICAL HISTORY:   Past Surgical History:  Procedure Laterality Date  . bilateral amputation Bilateral   . 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  He underwent T8-T9 transpedicular corpectomies and T5-T11 posterolateral arthrodesis, with T5-11 pedicle screw fixation and the T7-10 laminectomies with open reduction of pathological fractures, status post T8-T9 transpedicular corpectomies and T5-T11 posterolateral arthrodesis..   SOCIAL HISTORY:   Social History   Tobacco Use  . Smoking status: Current Every Day Smoker    Packs/day: 0.25    Types: Cigarettes  . Smokeless tobacco: Never Used  Substance Use Topics  . Alcohol use: Not Currently    FAMILY HISTORY:  No family history on file.  DRUG ALLERGIES:  No Known Allergies  REVIEW OF SYSTEMS:   ROS As per history of present illness. All pertinent systems were reviewed above. Constitutional,  HEENT, cardiovascular, respiratory, GI, GU, musculoskeletal, neuro, psychiatric, endocrine,  integumentary and hematologic systems were reviewed and are otherwise  negative/unremarkable except for positive findings mentioned above in the HPI.   MEDICATIONS AT HOME:   Prior to Admission medications   Medication Sig Start Date End Date Taking? Authorizing Provider  acetaminophen (TYLENOL) 325 MG tablet Take 650 mg by mouth every 4 (four) hours as needed for mild pain or fever.   Yes [provider]  amLODipine (NORVASC) 10 MG tablet Take 10 mg by mouth daily.   Yes [provider]  daptomycin (CUBICIN) IVPB Inject 900 mg into the vein daily.   Yes [provider]  diphenhydrAMINE (BENADRYL) 50 MG capsule Take 50 mg by mouth every 6 (six) hours as needed for itching.   Yes [provider]  docusate sodium (COLACE) 100 MG capsule Take 100 mg by mouth 2 (two) times daily.   Yes [provider]  famotidine (PEPCID) 20 MG tablet Take  20 mg by mouth 2 (two) times daily.   Yes [provider]  furosemide (LASIX) 40 MG tablet Take 40 mg by mouth 2 (two) times  daily.   Yes [provider]  Lactulose 20 GM/30ML SOLN Take 30 g by mouth 2 (two) times daily.    Yes [provider]  lisinopril (ZESTRIL) 20 MG tablet Take 20 mg by mouth daily.   Yes [provider]  methadone (DOLOPHINE) 10 MG tablet Take 10 mg by mouth 2 (two) times daily. For 5 days   Yes [provider]  methocarbamol (ROBAXIN) 500 MG tablet Take 1,000 mg by mouth 4 (four) times daily.    Yes [provider]  metoprolol succinate (TOPROL-XL) 25 MG 24 hr tablet Take 25 mg by mouth daily.   Yes [provider]  oxycodone (OXY-IR) 5 MG capsule Take 10 mg by mouth every 4 (four) hours as needed for pain.    Yes [provider]  polyethylene glycol (MIRALAX / GLYCOLAX) 17 g packet Take 17 g by mouth daily.   Yes [provider]  potassium chloride SA (K-DUR) 20 MEQ tablet Take 20 mEq by mouth daily.   Yes [provider]  pregabalin (LYRICA) 75 MG capsule Take 75 mg by mouth 2 (two) times daily.   Yes [provider]  rifampin (RIFADIN) 300 MG capsule Take 600 mg by mouth daily.   Yes [provider]  traZODone (DESYREL) 150 MG tablet Take 150 mg by mouth at bedtime.   Yes [provider]  Amino Acids (AMINO ACID PO) Take 30 mLs by mouth.    [provider]  bisacodyl (DULCOLAX) 10 MG suppository Place 10 mg rectally daily.     [provider]  bisacodyl (DULCOLAX) 5 MG EC tablet Take 10 mg by mouth daily.    [provider]  ferrous gluconate (FERGON) 324 MG tablet Take 324 mg by mouth 2 (two) times daily with a meal.     [provider]  heparin 5000 UNIT/ML injection Inject 5,000 Units into the skin every 12 (twelve) hours.     [provider]  heparin flush 10 UNIT/ML SOLN injection Inject 50 Units into the vein 2 (two) times daily. Also flush with 9ml of normal saline    [provider]  hydrALAZINE (APRESOLINE) 50 MG tablet Take  50 mg by mouth 3 (three) times daily.    [provider]  insulin glargine (LANTUS) 100 UNIT/ML injection Inject 20 Units into the skin at bedtime.     [provider]  insulin lispro (HUMALOG) 100 UNIT/ML injection Inject 0-15 Units into the skin 4 (four) times daily -  before meals and at bedtime. <150= 0 units 151-200= 3 units 201-250= 6 units 251-300= 9 units 301-350= 12 units 351-400= 15 units >400 call MD    [provider]  linaclotide (LINZESS) 290 MCG CAPS capsule Take 290 mcg by mouth daily before breakfast.    [provider]  pravastatin (PRAVACHOL) 40 MG tablet Take 40 mg by mouth daily.    [provider]  sodium phosphate Pediatric (FLEET) 3.5-9.5 GM/59ML enema Place 1 enema rectally every other day as needed for severe constipation.    [provider]      VITAL SIGNS:  There were no vitals taken for this visit.  PHYSICAL EXAMINATION:  Physical Exam  GENERAL:  58 y.o.-year-old patient lying in the bed with no acute distress.  EYES: Pupils equal, round,  reactive to light and accommodation. No scleral icterus. Extraocular muscles intact.  HEENT: Head atraumatic, normocephalic. Oropharynx and nasopharynx clear.  NECK:  Supple, no jugular venous distention. No thyroid enlargement, no tenderness.  LUNGS: Normal breath sounds bilaterally, no wheezing, rales,rhonchi or crepitation. No use of accessory muscles of respiration.  CARDIOVASCULAR: Regular rate and rhythm, S1, S2 normal. No murmurs, rubs, or gallops.  ABDOMEN: Soft, nondistended, nontender. Bowel sounds present. No organomegaly or mass.  EXTREMITIES: His status post bilateral below-knee amputation with intact stump.  NEUROLOGIC: Cranial nerves II through XII are intact. Muscle strength 5/5 in all extremities. Sensation intact. Gait not checked.  PSYCHIATRIC: The patient is alert and oriented x 3.  Normal affect and good eye contact. SKIN: No obvious rash, lesion,  or ulcer.   LABORATORY PANEL:   CBC No results for input(s): WBC, HGB, HCT, PLT in the last 168 hours. ------------------------------------------------------------------------------------------------------------------  Chemistries  No results for input(s): NA, K, CL, CO2, GLUCOSE, BUN, CREATININE, CALCIUM, MG, AST, ALT, ALKPHOS, BILITOT in the last 168 hours.  Invalid input(s): GFRCGP ------------------------------------------------------------------------------------------------------------------  Cardiac Enzymes No results for input(s): TROPONINI in the last 168 hours. ------------------------------------------------------------------------------------------------------------------  RADIOLOGY:  No results found.    IMPRESSION AND PLAN:   #1.  Thoracic 8/9 osteomyelitis with epidural abscess, status post a T7-9 laminectomy on 3/30,  [facility] osteomyelitis with kyphotic deformity.  He underwent T8-T9 transpedicular corpectomies and T5-T11 posterolateral arthrodesis, with T5-11 pedicle screw fixation and the T7-10 laminectomies with open reduction of pathological fractures, status post T8-T9 transpedicular corpectomies and T5-T11 posterolateral arthrodesis.. Pain management will be provided.  We will continue him on IV daptomycin and p.o. rifampin to complete 6 weeks of therapy from 5/21 till 03/25/2019 that should cover his MRSA discitis/osteomyelitis.  Physical therapy consultation will be obtained and case management consult to plan discharge.  2.  MRSA bacteremia and septicemia.  This has been managed and we will continue IV daptomycin for.  Will follow CBC with differential CMP and CK on daptomycin.  3.  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  Most recent hemoglobin A1c was 6.3 on 02/08/2019.  We will continue Lantus basal coverage as well as continue supplemental coverage with Humalog.  4.   Hypertension.  We will continue his antihypertensives.  5.  Tobacco abuse.  He was counseled for cessation and receive further counseling here.  6.  Peripheral neuropathy.  Lyrica will be continued.  7.  DVT prophylaxis.  Subcutaneous heparin.   All the records are reviewed and case discussed with ED provider. The plan of care was discussed in details with the patient (and family). I answered all questions. The patient agreed to proceed with the above mentioned plan. Further management will depend upon hospital course.   CODE STATUS: Full code  TOTAL TIME TAKING CARE OF THIS PATIENT: 65 minutes.    Hannah BeatJan A Jacobie Stamey M.D on 02/26/2019 at 5:05 AM  Pager - [phone]  After 6pm go to [email or web address] - Social research officer, [email or web address] [email or web address] [facility]  [facility] [place] [facility]  Office  [phone]  CC: Primary care physician; Dimple CaseySmith, Sean A, MD   Note: This dictation was prepared with Dragon dictation along with smaller phrase technology. Any transcriptional errors that result from this process are unintentional.

## 2019-02-26 NOTE — Evaluation (Signed)
Physical Therapy Evaluation Patient Details Name: Eugene [name] MRN: [id number] DOB: [birth year] Today's Date: 02/26/2019   History of Present Illness  57 y.o. 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  Status post T8-T9 transpedicular corpectomies and T5-T11 posterolateral arthrodesis about 3 months ago.  Pt reports he has been unable to do a lot of mobility or even sitting since that time, and reports b/l sensory and strength changes since back surgeries.  Clinical Impression  Pt reports he has been very limited for 3 months with very little mobility/exercises at his nursing facility (insurance issues?) but is eager to do all he can.  He has b/l LE weakness since multiple level spinal surgery and has only limited AROM in R hip (abd 3+/5) and partial SAQ and grossly 3-/5 otherwise (minimal L hip abd 3-/5, otherwise 2+/5 or less on L), also with decreased sensation and coordination.  Pt has L prosthetic but again has not used it in months.  Pt clearly very frustrated with his current situation, interested in more intensive PT such as a inpatient rehab setting.  Difficult situation.      Follow Up Recommendations CIR;SNF(Pt will need PT on discharge regardless of setting)    Equipment Recommendations       Recommendations for Other Services       Precautions / Restrictions Precautions Precautions: Fall Required Braces or Orthoses: Spinal Brace Restrictions Weight Bearing Restrictions: No      Mobility  Bed Mobility Overal bed mobility: Needs Assistance Bed Mobility: Supine to Sit;Sit to Supine     Supine to sit: Mod assist Sit to supine: Min assist   General bed mobility comments: Pt with heavy UE use to get to sitting EOB.  Needed b/l UE assist to get to sitting, able to maintain neutral spine w/ cuing.  Found to have had large bowel movement once  sitting and returned to supine with assist  Transfers                 General transfer comment: Does not have his R prosthetic here (not currently even working on getting L prosthetic)   Ambulation/Gait                Stairs            Wheelchair Mobility    Modified Rankin (Stroke Patients Only)       Balance Overall balance assessment: Needs assistance Sitting-balance support: Bilateral upper extremity supported Sitting balance-Leahy Scale: Fair Sitting balance - Comments: Pt is able to maintain sitting balance with b/l UE use, unsteadiness and c/o discomfort.                                     Pertinent Vitals/Pain Pain Assessment: 0-10 Pain Score: 7  Pain Location: R flank/lumbar area around to the front    Home Living Family/patient expects to be discharged to:: Skilled nursing facility(has been at SNF X 3 months)                      Prior Function Level of Independence: Needs assistance   Gait / Transfers Assistance Needed: 3 months ago (pre L BKA and back sx) he was working in Holiday representative, painting, Therapist, music, etc     Comments: Pt reports that he has not been  able to even sit upright more than a few times in the last few months and insurance he has not been able to work with PT (2/2 insurance)     Hand Dominance        Extremity/Trunk Assessment   Upper Extremity Assessment Upper Extremity Assessment: Overall WFL for tasks assessed    Lower Extremity Assessment Lower Extremity Assessment: Generalized weakness(R LE grossly 3-/5, L 2+/5 with decreased sensation t/o)       Communication   Communication: No difficulties  Cognition Arousal/Alertness: Awake/alert Behavior During Therapy: Restless Overall Cognitive Status: Within Functional Limits for tasks assessed                                        General Comments      Exercises     Assessment/Plan    PT Assessment  Patient needs continued PT services  PT Problem List Decreased strength;Decreased range of motion;Decreased activity tolerance;Decreased balance;Decreased mobility;Decreased knowledge of use of DME;Decreased safety awareness;Pain;Decreased knowledge of precautions       PT Treatment Interventions DME instruction;Functional mobility training;Therapeutic activities;Therapeutic exercise;Balance training;Gait training;Neuromuscular re-education;Patient/family education    PT Goals (Current goals can be found in the Care Plan section)  Acute Rehab PT Goals Patient Stated Goal: Get PT services wherever he ends up PT Goal Formulation: With patient Time For Goal Achievement: 03/12/19 Potential to Achieve Goals: Fair    Frequency Min 2X/week   Barriers to discharge        Co-evaluation               AM-PAC PT "6 Clicks" Mobility  Outcome Measure Help needed turning from your back to your side while in a flat bed without using bedrails?: A Little Help needed moving from lying on your back to sitting on the side of a flat bed without using bedrails?: A Lot Help needed moving to and from a bed to a chair (including a wheelchair)?: Total Help needed standing up from a chair using your arms (e.g., wheelchair or bedside chair)?: Total Help needed to walk in hospital room?: Total Help needed climbing 3-5 steps with a railing? : Total 6 Click Score: 9    End of Session   Activity Tolerance: (pt with large bowel movement in sitting, deferred further PT) Patient left: with bed alarm set;with call bell/phone within reach Nurse Communication: Mobility status(need for clean up) PT Visit Diagnosis: Muscle weakness (generalized) (M62.81);Difficulty in walking, not elsewhere classified (R26.2);Pain Pain - Right/Left: Right Pain - part of body: Hip(lumbago)    Time: [id number] PT Time Calculation (min) (ACUTE ONLY): 40 min   Charges:   PT Evaluation $PT Eval Low Complexity: 1 Low           Malachi ProGalen R Rafael Salway, DPT 02/26/2019, 5:49 PM

## 2019-02-26 NOTE — Progress Notes (Signed)
Pharmacy Antibiotic Note  Eugene [name] is a 58 y.o. male admitted on 02/26/2019 with MRSA bacteremia s/t disc osteomyelitis.  Pharmacy has been consulted for daptomycin dosing. Patient is a transfer from [facility] where he has been followed for a spinal osteomyelitis/  Patient was here on 05/19 for AKI and prior to that had seen vascular for PICC line placement for outpatient daptomycin therapy for MRSA Osteomyelitis of spine.  Plan: Will order a baseline CK level to ensure patient not in rhabdomyolysis, also on pravastatin. Will continue daptomycin 900 mg IV q24h (10 mg/kg IV) per [facility] dosing. Renal function appropriate for q24h dosing. Will continue to monitor and adjust as necessary.   Weight: 233 lb 11 oz (106 kg)  Temp (24hrs), Avg:98.6 F (37 C), Min:98.6 F (37 C), Max:98.6 F (37 C)  Recent Labs  Lab 02/26/19 0546  WBC 6.1  CREATININE 1.23    Estimated Creatinine Clearance: 85.4 mL/min (by C-G formula based on SCr of 1.23 mg/dL).    No Known Allergies   Thank you for allowing pharmacy to be a part of this patient's care.  Thomasene Ripple, PharmD, BCPS Clinical Pharmacist 02/26/2019 7:20 AM

## 2019-02-26 NOTE — Consult Note (Signed)
NAME: Eugene [name]  DOB: 1961/06/29  MRN: [id number]  Date/Time: 02/26/2019 3:18 PM  REQUESTING PROVIDER: Imogene Burn Subjective:  REASON FOR CONSULT: Vertebral osteomyelitis and discitis. ? Eugene [name] is a 58 y.o. male with a history of bilateral BKA multiple MRSA infections in the past including osteomyelitis but more recently with 2 prolonged  MRSA bacteremia and spinal osteomyelitis with epidural abscess is back to [facility] after undergoing surgery at [facility]. Patient has a complicated infectious disease history mostly centered around MRSA.  In February 2020 he was hospitalized in 1 of the [facility] hospital between 11/06/18-11/28/18 with recurrent MRSA bacteremia, chronic osteo of foot  and recurrent wound infection    Blood cultures from 10/30/2018, 11/06/2018, 11/08/2018, 11/10/2018, 11/12/2018, were all positive for MRSA.  Blood culture on 11/14/2018 was negative.  He was treated with IV vancomycin from date of negative culture for 2 weeks.  2/19  TEE was negative  He was readmitted between 12/09/18-01/08/19  For MRSA bacteremia and MRI showed T8-T9 discitis/osteo- He underwent laminectomy on 12/22/18 and left BKA. OR culture had enterococcus . He was treated with 6 weeks of Iv vancomycin to finish on 01/30/19.  Presented to [facility] on 02/06/19 from [place] [facility] care of abnormal creatinine-of 2.75  The case was discussed with [facility] physician on call and it was decided to stop antibiotics as he had completed therapy. T8-9 discitis osteomyelitis. 50% loss of height of T8 vertebral body and deformity of T9 superior endplate with 10-20% loss of vertebral body height. 2. Focal kyphosis and 7 mm bony retropulsion of T8 vertebral body impinge the spinal cord. 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 3. Dorsal epidural abscess extends superiorly to T5 level. Small ventral epidural abscess may be present posterior to T9 vertebral body. 4. Dorsal epidural abscess appears to  communicate with a collection in the left T8-9 facet and multiloculated collection within dorsal soft tissues. 5. Small anterior paraspinal collections at the T8-9 disc level measuring 18 mm on right and 10 mm on the left. He was transferred to [facility] and underwent T8-T9 corpectomies with arthrodesis, hardware placed on 02/12/19--.OP cultures <1+ MRSA.Blood culture was neg and TTE was negative.was given daptomycin + rifampin and was transferred back to [facility] to complete IV until 03/25/19. Vanco MIC was 2 for MRSA I am asked to see the  patient for the same  PMH Rt BKA in 2007 In 2015 he had right BKA stump infection with MRSA and was treated. In October 2018 he had MRSA of the left foot which was  surgically debrided. And in November 2018 he again had MRSA and Proteus infection of the left foot and had debridement and was treated with IV antibiotics including vancomycin and cefepime.   12/22/2018 had left BKA.  In May 2019 he had left septic knee arthritis and had MRSA which has had a surgical washout and treated with 4 weeks of vancomycin.  Past Medical History:  Diagnosis Date  . Diabetes mellitus without complication (HCC)   . Osteomyelitis [facility])     Past Surgical History:  Procedure Laterality Date  . bilateral amputation Bilateral   . CENTRAL LINE INSERTION-TUNNELED N/A 02/02/2019   Procedure: CENTRAL LINE INSERTION-TUNNELED;  Surgeon: Annice Needy, MD;  Location: [facility] INVASIVE CV LAB;  Service: Cardiovascular;  Laterality: N/A;    Social History   Socioeconomic History  . Marital status: Single    Spouse name: Not on file  . Number of children: Not on file  . Years  of education: Not on file  . Highest education level: Not on file  Occupational History  . Not on file  Social Needs  . Financial resource strain: Somewhat hard  . Food insecurity:    Worry: Sometimes true    Inability: Sometimes true  . Transportation needs:    Medical: No    Non-medical: No  Tobacco Use  .  Smoking status: Current Every Day Smoker    Packs/day: 0.25    Types: Cigarettes  . Smokeless tobacco: Never Used  Substance and Sexual Activity  . Alcohol use: Not Currently  . Drug use: Not on file  . Sexual activity: Not on file  Lifestyle  . Physical activity:    Days per week: 0 days    Minutes per session: Not on file  . Stress: To some extent  Relationships  . Social connections:    Talks on phone: More than three times a week    Gets together: More than three times a week    Attends religious service: More than 4 times per year    Active member of club or organization: Not on file    Attends meetings of clubs or organizations: Never    Relationship status: Never married  . Intimate partner violence:    Fear of current or ex partner: Patient refused    Emotionally abused: Patient refused    Physically abused: Patient refused    Forced sexual activity: Patient refused  Other Topics Concern  . Not on file  Social History Narrative  . Not on file    No family history on file. No Known Allergies  ? Current Facility-Administered Medications  Medication Dose Route Frequency Provider Last Rate Last Dose  . 0.9 %  sodium chloride infusion   Intravenous Continuous Shaune Pollack, MD 100 mL/hr at 02/26/19 1132    . acetaminophen (TYLENOL) tablet 650 mg  650 mg Oral Q6H PRN Mansy, Jan A, MD       Or  . acetaminophen (TYLENOL) suppository 650 mg  650 mg Rectal Q6H PRN Mansy, Jan A, MD      . amLODipine (NORVASC) tablet 10 mg  10 mg Oral Daily Mansy, Jan A, MD   10 mg at 02/26/19 0953  . bisacodyl (DULCOLAX) EC tablet 10 mg  10 mg Oral Daily Mansy, Jan A, MD   10 mg at 02/26/19 0951  . bisacodyl (DULCOLAX) suppository 10 mg  10 mg Rectal Daily Mansy, Jan A, MD      . diphenhydrAMINE (BENADRYL) capsule 50 mg  50 mg Oral Q6H PRN Mansy, Jan A, MD   50 mg at 02/26/19 0640  . docusate sodium (COLACE) capsule 100 mg  100 mg Oral BID Mansy, Jan A, MD   100 mg at 02/26/19 4782  .  famotidine (PEPCID) tablet 20 mg  20 mg Oral BID Mansy, Jan A, MD   20 mg at 02/26/19 9562  . ferrous gluconate (FERGON) tablet 324 mg  324 mg Oral BID WC Mansy, Jan A, MD   324 mg at 02/26/19 0950  . furosemide (LASIX) tablet 40 mg  40 mg Oral BID Mansy, Jan A, MD   40 mg at 02/26/19 0952  . heparin injection 5,000 Units  5,000 Units Subcutaneous Q12H Mansy, Vernetta Honey, MD   5,000 Units at 02/26/19 0954  . hydrALAZINE (APRESOLINE) tablet 50 mg  50 mg Oral TID Mansy, Jan A, MD   50 mg at 02/26/19 1308  . HYDROcodone-acetaminophen (NORCO/VICODIN) 5-325 MG  per tablet 1-2 tablet  1-2 tablet Oral Q4H PRN Mansy, Jan A, MD      . insulin aspart (novoLOG) injection 0-15 Units  0-15 Units Subcutaneous TID [facility] Mansy, Vernetta Honey, MD   3 Units at 02/26/19 1250  . insulin glargine (LANTUS) injection 20 Units  20 Units Subcutaneous QHS Mansy, Jan A, MD      . lactulose (CHRONULAC) 10 GM/15ML solution 30 g  30 g Oral BID Mansy, Jan A, MD   30 g at 02/26/19 0953  . linaclotide (LINZESS) capsule 290 mcg  290 mcg Oral QAC breakfast Mansy, Jan A, MD   290 mcg at 02/26/19 0954  . linezolid (ZYVOX) IVPB 600 mg  600 mg Intravenous Q12H Generoso Cropper, Rhodia Albright, MD      . magnesium hydroxide (MILK OF MAGNESIA) suspension 30 mL  30 mL Oral Daily PRN Mansy, Jan A, MD      . methocarbamol (ROBAXIN) tablet 1,000 mg  1,000 mg Oral QID Mansy, Jan A, MD   1,000 mg at 02/26/19 1346  . metoprolol succinate (TOPROL-XL) 24 hr tablet 25 mg  25 mg Oral Daily Mansy, Jan A, MD   25 mg at 02/26/19 0950  . ondansetron (ZOFRAN) tablet 4 mg  4 mg Oral Q6H PRN Mansy, Jan A, MD       Or  . ondansetron [facility]) injection 4 mg  4 mg Intravenous Q6H PRN Mansy, Jan A, MD      . oxyCODONE (Oxy IR/ROXICODONE) immediate release tablet 10 mg  10 mg Oral Q4H PRN Mansy, Jan A, MD   10 mg at 02/26/19 1134  . polyethylene glycol (MIRALAX / GLYCOLAX) packet 17 g  17 g Oral Daily Mansy, Jan A, MD   17 g at 02/26/19 0954  . pregabalin (LYRICA) capsule 75 mg  75  mg Oral BID Mansy, Jan A, MD   75 mg at 02/26/19 0952  . rifampin (RIFADIN) capsule 600 mg  600 mg Oral Daily Mansy, Jan A, MD   600 mg at 02/26/19 0953  . sodium phosphate (FLEET) 7-19 GM/118ML enema 1 enema  1 enema Rectal Q48H PRN Mansy, Jan A, MD      . traZODone (DESYREL) tablet 150 mg  150 mg Oral QHS Mansy, Jan A, MD      . traZODone (DESYREL) tablet 25 mg  25 mg Oral QHS PRN Mansy, Jan A, MD         Abtx:  Anti-infectives (From admission, onward)   Start     Dose/Rate Route Frequency Ordered Stop   02/26/19 1800  DAPTOmycin (CUBICIN) 900 mg in sodium chloride 0.9 % IVPB  Status:  Discontinued     900 mg 236 mL/hr over 30 Minutes Intravenous Daily 02/26/19 0708 02/26/19 1351   02/26/19 1800  linezolid (ZYVOX) IVPB 600 mg     600 mg 300 mL/hr over 60 Minutes Intravenous Every 12 hours 02/26/19 1351     02/26/19 1000  rifampin (RIFADIN) capsule 600 mg     600 mg Oral Daily 02/26/19 0504     02/26/19 0515  daptomycin (CUBICIN) IVPB  Status:  Discontinued     900 mg Intravenous Every 24 hours 02/26/19 0504 02/26/19 0705      REVIEW OF SYSTEMS:  Const: negative fever, negative chills, some weight loss Eyes: negative diplopia or visual changes, negative eye pain ENT: negative coryza, negative sore throat Resp: negative cough, hemoptysis, dyspnea Cards: negative for chest pain, palpitations, lower extremity edema GU: urinary  retention s needing foley  GI: Negative for abdominal pain, diarrhea, bleeding, constipation Skin: has rash over his chinHeme: negative for easy bruising and gum/nose bleeding MS: generalized weakness Neurolo:negative for headaches, dizziness, vertigo, memory problems  Psych: substance use  depression  Endocrine: no polyuria or polydipsia Allergy/Immunology- negative for any medication or food allergies  Objective:  VITALS:  BP 137/72 (BP Location: Left Arm)   Pulse (!) 57   Temp 98.8 F (37.1 C) (Oral)   Resp 14   Wt 106 kg   SpO2 99%   BMI 31.69  kg/m  PHYSICAL EXAM:  General: Alert, cooperative, no distress, appears stated age.  Head: Normocephalic, without obvious abnormality, atraumatic. Face scaly rash over his chin Eyes: Conjunctivae clear, anicteric sclerae. Pupils are equal ENT Nares normal. No drainage or sinus tenderness. Lips, mucosa, and tongue normal. No Thrush Neck: Supple, symmetrical, no adenopathy, thyroid: non tender no carotid bruit and no JVD. Back: No CVA tenderness. thoracolumbar surgical site clean with no erythema or discharge- Staples removed Lungs: Clear to auscultation bilaterally. No Wheezing or Rhonchi. No rales. Heart: Regular rate and rhythm, no murmur, rub or gallop. Abdomen: Soft, non-tender,not distended. Bowel sounds normal. No masses Extremities: b/l BKA Skin: as above Lymph: Cervical, supraclavicular normal. Neurologic: Grossly non-focal Pertinent Labs Lab Results CBC    Component Value Date/Time   WBC 6.1 02/26/2019 0546   RBC 2.62 (L) 02/26/2019 0546   HGB 7.6 (L) 02/26/2019 0546   HCT 23.9 (L) 02/26/2019 0546   PLT 298 02/26/2019 0546   MCV 91.2 02/26/2019 0546   MCH 29.0 02/26/2019 0546   MCHC 31.8 02/26/2019 0546   RDW 14.0 02/26/2019 0546   LYMPHSABS 1.9 02/26/2019 0546   MONOABS 0.6 02/26/2019 0546   EOSABS 0.2 02/26/2019 0546   BASOSABS 0.1 02/26/2019 0546    CMP Latest Ref Rng & Units 02/26/2019 02/10/2019 02/09/2019  Glucose 70 - 99 mg/dL 161(W) 960(A) 540(J)  BUN 6 - 20 mg/dL 81(X) 91(Y) 78(G)  Creatinine 0.61 - 1.24 mg/dL 9.56 2.13 0.86  Sodium 135 - 145 mmol/L 132(L) 139 136  Potassium 3.5 - 5.1 mmol/L 5.6(H) 4.4 4.9  Chloride 98 - 111 mmol/L 100 116(H) 114(H)  CO2 22 - 32 mmol/L 23 17(L) 15(L)  Calcium 8.9 - 10.3 mg/dL 8.3(L) 9.8 9.9  Total Protein 6.5 - 8.1 g/dL 7.2 - -  Total Bilirubin 0.3 - 1.2 mg/dL 0.4 - -  Alkaline Phos 38 - 126 U/L 135(H) - -  AST 15 - 41 U/L 109(H) - -  ALT 0 - 44 U/L 49(H) - -      Microbiology: IMAGING RESULTS: I have  personally reviewed the films ? Impression/Recommendation ?58 y.o. male with substance use, history of bilateral BKA multiple MRSA infections in the past including osteomyelitis but more recently with 2 prolonged  MRSA bacteremia and spinal osteomyelitis with epidural abscess is back to [facility] after undergoing surgery at [facility]. Patient has a complicated infectious disease history mostly centered around MRSA.  Recurrent MRSA infection. 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  OR cultures had less than 1+ MRSA. ?Patient was transferred from [facility] on pravastatin, daptomycin and rifampin. His CPK is more than 5000.  On June 2 it was around 500. So we will stop both daptomycin and pravastatin. We will start him on linezolid 600 mg twice daily and continue rifampin.  Once the CPK is normalized we can try daptomycin without  pravastatin to see whether he will tolerate that without rhabdo.  He is going to need IV antibiotics until March 26, 2019.  And after that he is going to need indefinite suppressive anti-MRSA with doxycycline (+-/-minus rifampin)  Anemia of chronic disease  ? _Bilateral BKA secondary to MRSA infections  Diabetes mellitus  Polysubstance use -he denies IVDA _ Discussed with patient, requesting provider  Thank you for this consult. ID will follow along  Note:  This document was prepared using Dragon voice recognition software and may include unintentional dictation errors.

## 2019-02-26 NOTE — Consult Note (Addendum)
Referring Physician:  No referring provider defined for this encounter.  Primary Physician:  Dimple Casey, MD  Chief Complaint:  Prior surgery, staples removal  History of Present Illness: 02/26/2019 Eugene [name] is a 58 y.o. male who presents with the chief complaint of discitis/osteomyelitis with apparent spinal cord injury.  He has been treated at [facility], where he underwent an extensive thoracic washout and reconstruction in May 2020.  He had previously had a thoracic decompression and washout in March 2020 in [place].  He is inquisitive about the details surrounding the surgery, but I am unable to provide those as I was not involved and do not have all of the [facility] records.  I am consulted for staple removal.  He has no fevers, chills, or wound drainage. He reports inability to move his L leg since sometime around surgery.  He previously was admitted for a foot amputation in March and ultimately had a spinal infection requiring surgery.  I do not have all the [facility] records, but he has had an extensive hospital course over the past couple of months.  Review of Systems:  A 10 point review of systems is negative, except for the pertinent positives and negatives detailed in the HPI.  Past Medical History: Past Medical History:  Diagnosis Date  . Diabetes mellitus without complication (HCC)   . Osteomyelitis [facility])     Past Surgical History: Past Surgical History:  Procedure Laterality Date  . bilateral amputation Bilateral   . CENTRAL LINE INSERTION-TUNNELED N/A 02/02/2019   Procedure: CENTRAL LINE INSERTION-TUNNELED;  Surgeon: Annice Needy, MD;  Location: [facility] INVASIVE CV LAB;  Service: Cardiovascular;  Laterality: N/A;    Allergies: Allergies as of 02/24/2019  . (No Known Allergies)    Medications:  Current Facility-Administered Medications:  .  0.9 %  sodium chloride infusion, , Intravenous, Continuous, Shaune Pollack, MD, Last Rate: 100 mL/hr at 02/26/19 1132 .   acetaminophen (TYLENOL) tablet 650 mg, 650 mg, Oral, Q6H PRN **OR** acetaminophen (TYLENOL) suppository 650 mg, 650 mg, Rectal, Q6H PRN, Mansy, Jan A, MD .  amLODipine (NORVASC) tablet 10 mg, 10 mg, Oral, Daily, Mansy, Jan A, MD, 10 mg at 02/26/19 0953 .  bisacodyl (DULCOLAX) EC tablet 10 mg, 10 mg, Oral, Daily, Mansy, Jan A, MD, 10 mg at 02/26/19 0951 .  bisacodyl (DULCOLAX) suppository 10 mg, 10 mg, Rectal, Daily, Mansy, Jan A, MD .  diphenhydrAMINE (BENADRYL) capsule 50 mg, 50 mg, Oral, Q6H PRN, Mansy, Jan A, MD, 50 mg at 02/26/19 0640 .  docusate sodium (COLACE) capsule 100 mg, 100 mg, Oral, BID, Mansy, Jan A, MD, 100 mg at 02/26/19 1610 .  famotidine (PEPCID) tablet 20 mg, 20 mg, Oral, BID, Mansy, Jan A, MD, 20 mg at 02/26/19 9604 .  ferrous gluconate (FERGON) tablet 324 mg, 324 mg, Oral, BID WC, Mansy, Jan A, MD, 324 mg at 02/26/19 0950 .  furosemide (LASIX) tablet 40 mg, 40 mg, Oral, BID, Mansy, Jan A, MD, 40 mg at 02/26/19 5409 .  heparin injection 5,000 Units, 5,000 Units, Subcutaneous, Q12H, Mansy, Vernetta Honey, MD, 5,000 Units at 02/26/19 0954 .  hydrALAZINE (APRESOLINE) tablet 50 mg, 50 mg, Oral, TID, Mansy, Jan A, MD, 50 mg at 02/26/19 0952 .  HYDROcodone-acetaminophen (NORCO/VICODIN) 5-325 MG per tablet 1-2 tablet, 1-2 tablet, Oral, Q4H PRN, Mansy, Jan A, MD .  insulin aspart (novoLOG) injection 0-15 Units, 0-15 Units, Subcutaneous, TID AC & HS, Mansy, Vernetta Honey, MD, 3 Units at 02/26/19 1250 .  insulin glargine (LANTUS) injection 20 Units, 20 Units, Subcutaneous, QHS, Mansy, Jan A, MD .  lactulose (CHRONULAC) 10 GM/15ML solution 30 g, 30 g, Oral, BID, Mansy, Jan A, MD, 30 g at 02/26/19 0953 .  linaclotide (LINZESS) capsule 290 mcg, 290 mcg, Oral, QAC breakfast, Mansy, Jan A, MD, 290 mcg at 02/26/19 0954 .  linezolid (ZYVOX) IVPB 600 mg, 600 mg, Intravenous, Q12H, Ravishankar, Jayashree, MD .  magnesium hydroxide (MILK OF MAGNESIA) suspension 30 mL, 30 mL, Oral, Daily PRN, Mansy, Jan A, MD .   methocarbamol (ROBAXIN) tablet 1,000 mg, 1,000 mg, Oral, QID, Mansy, Jan A, MD, 1,000 mg at 02/26/19 1346 .  metoprolol succinate (TOPROL-XL) 24 hr tablet 25 mg, 25 mg, Oral, Daily, Mansy, Jan A, MD, 25 mg at 02/26/19 0950 .  ondansetron (ZOFRAN) tablet 4 mg, 4 mg, Oral, Q6H PRN **OR** ondansetron (ZOFRAN) injection 4 mg, 4 mg, Intravenous, Q6H PRN, Mansy, Jan A, MD .  oxyCODONE (Oxy IR/ROXICODONE) immediate release tablet 10 mg, 10 mg, Oral, Q4H PRN, Mansy, Jan A, MD, 10 mg at 02/26/19 1134 .  patiromer Lelon Perla(VELTASSA) packet 8.4 g, 8.4 g, Oral, Once, Shaune Pollackhen, Qing, MD .  polyethylene glycol [facility](MIRALAX / GLYCOLAX) packet 17 g, 17 g, Oral, Daily, Mansy, Jan A, MD, 17 g at 02/26/19 0954 .  pregabalin (LYRICA) capsule 75 mg, 75 mg, Oral, BID, Mansy, Jan A, MD, 75 mg at 02/26/19 0952 .  rifampin (RIFADIN) capsule 600 mg, 600 mg, Oral, Daily, Mansy, Jan A, MD, 600 mg at 02/26/19 0953 .  sodium phosphate (FLEET) 7-19 GM/118ML enema 1 enema, 1 enema, Rectal, Q48H PRN, Mansy, Jan A, MD .  traZODone (DESYREL) tablet 150 mg, 150 mg, Oral, QHS, Mansy, Jan A, MD .  traZODone (DESYREL) tablet 25 mg, 25 mg, Oral, QHS PRN, Mansy, Vernetta HoneyJan A, MD   Social History: Social History   Tobacco Use  . Smoking status: Current Every Day Smoker    Packs/day: 0.25    Types: Cigarettes  . Smokeless tobacco: Never Used  Substance Use Topics  . Alcohol use: Not Currently  . Drug use: Not on file    Family Medical History: No family history on file.  Physical Examination: Vitals:   02/26/19 0754 02/26/19 1549  BP: 137/72 126/73  Pulse: (!) 57 73  Resp:    Temp: 98.8 F (37.1 C) 99.6 F (37.6 C)  SpO2: 99% 98%     General: Patient is well developed, well nourished, calm, collected, and in no apparent distress.  Psychiatric: Patient is non-anxious.  Head:  Pupils equal, round, and reactive to light.  ENT:  Oral mucosa appears well hydrated.  Neck:   Supple.  Full range of motion.  Respiratory: Patient is  breathing without any difficulty.  Extremities: No edema. Bilateral leg amputations.  Vascular: Cannot palpate DP pulses - legs amputated  Skin:   On exposed skin, there are no abnormal skin lesions.  Incision - stapled, clean dry and intact.  NEUROLOGICAL:  General: In no acute distress.   Awake, alert, oriented to person, place, and time.  Pupils equal round and reactive to light.  Facial tone is symmetric.  Tongue protrusion is midline.  There is no pronator drift.  ROM of spine: diminished.  Palpation of spine: nontender.    Strength: Side Biceps Triceps Deltoid Interossei Grip Wrist Ext. Wrist Flex.  R 5 5 5 5 5 5 5   L 5 5 5 5 5 5 5    Side Iliopsoas Quads Hamstring PF DF  EHL  R 2 3 2  - - -  L 1 2 2  - - -   Reflexes are 1+ and symmetric at the biceps, triceps, brachioradialis, patella.   Bilateral upper extremity sensation is intact to light touch, but diminished on left more than right in his legs.  Clonus is not tested.  Toes are not present.  Gait is untested.    Hoffman's is absent.  Imaging: MRI T spine 02/09/2019 IMPRESSION: Severe motion degraded study.  1. T8-9 discitis osteomyelitis. 50% loss of height of T8 vertebral body and deformity of T9 superior endplate with 10-20% loss of vertebral body height. 2. Focal kyphosis and 7 mm bony retropulsion of T8 vertebral body impinge the spinal cord. 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 3. Dorsal epidural abscess extends superiorly to T5 level. Small ventral epidural abscess may be present posterior to T9 vertebral body. 4. Dorsal epidural abscess appears to communicate with a collection in the left T8-9 facet and multiloculated collection within dorsal soft tissues. 5. Small anterior paraspinal collections at the T8-9 disc level measuring 18 mm on right and 10 mm on the left.  These results will be called to the ordering clinician or representative by the Radiologist Assistant, and  communication documented in the PACS or zVision Dashboard.   Electronically Signed   By: Mitzi Hansen M.D.   On: 02/09/2019 19:51  I have personally reviewed the images and agree with the above interpretation.  Assessment and Plan: Mr. Fluellen is a pleasant 58 y.o. 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 Reola Calkins.  His exam is slightly improved compared to his discharge examination, so I do not recommend additional imaging at this time.    His incision is healing well.  I removed his staples without incident.  He should follow-up with Dr. Kevan Rosebush in 2-4 weeks.  I have been in contact with Dr. Kevan Rosebush, who has arranged an appointment for him.  Eugene Meadow K. Myer Haff MD, MPHS Dept. of Neurosurgery

## 2019-02-26 NOTE — Progress Notes (Signed)
Saw the patient bedside.  Vital signs and labs reviewed.  Physical examination is done. Continue current antibiotics, ID and neurosurgery consult informed.  Discussed with the patient and RN, spent about 25 minutes.

## 2019-02-27 LAB — GLUCOSE, CAPILLARY
Glucose-Capillary: 158 mg/dL — ABNORMAL HIGH (ref 70–99)
Glucose-Capillary: 270 mg/dL — ABNORMAL HIGH (ref 70–99)
Glucose-Capillary: 266 mg/dL — ABNORMAL HIGH (ref 70–99)
Glucose-Capillary: 187 mg/dL — ABNORMAL HIGH (ref 70–99)
Glucose-Capillary: 249 mg/dL — ABNORMAL HIGH (ref 70–99)

## 2019-02-27 LAB — BASIC METABOLIC PANEL
Anion gap: 8 (ref 5–15)
BUN: 30 mg/dL — ABNORMAL HIGH (ref 6–20)
CO2: 22 mmol/L (ref 22–32)
Calcium: 8.3 mg/dL — ABNORMAL LOW (ref 8.9–10.3)
Chloride: 102 mmol/L (ref 98–111)
Creatinine, Ser: 1.04 mg/dL (ref 0.61–1.24)
GFR calc Af Amer: >60 mL/min (ref >60)
GFR calc non Af Amer: >60 mL/min (ref >60)
Glucose, Bld: 162 mg/dL — ABNORMAL HIGH (ref 70–99)
Potassium: 5.1 mmol/L (ref 3.5–5.1)
Sodium: 132 mmol/L — ABNORMAL LOW (ref 135–145)

## 2019-02-27 LAB — CK: Total CK: 7441 U/L — ABNORMAL HIGH (ref 49–397)

## 2019-02-27 NOTE — Progress Notes (Signed)
Physical Therapy Treatment Patient Details Name: Eugene [name] MRN: [id number] DOB: 30-Dec-1960 Today's Date: 02/27/2019    History of Present Illness 58 y.o. 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  Status post T8-T9 transpedicular corpectomies and T5-T11 posterolateral arthrodesis about 3 months ago.  Pt reports he has been unable to do a lot of mobility or even sitting since that time, and reports b/l sensory and strength changes since back surgeries.    PT Comments    Pt eager to work with PT but both pain limited with donning brace, getting to sitting, maintaining sitting and attempting to scoot/arm push up in attempt to work on transfers.  Ultimately he showed great effort and willingness but was unable to do a whole lot functionally, attempts to even get slide board under hip or do minimal scooting were limited with poor tolerance despite good overall UE strength.  He showed good effort with supine exercises (straining very hard much of the time with weak and inconsistent movement).  Pt anxious and despite +2 assist much of the time needed a lot of reinforcement to stay on tasks and focus on what and how to perform cued tasks.   Follow Up Recommendations  CIR;SNF     Equipment Recommendations  Wheelchair (measurements PT)    Recommendations for Other Services       Precautions / Restrictions Precautions Precautions: Fall Required Braces or Orthoses: Spinal Brace Restrictions Weight Bearing Restrictions: No    Mobility  Bed Mobility Overal bed mobility: Needs Assistance Bed Mobility: Supine to Sit;Sit to Supine     Supine to sit: Mod assist +2 Sit to supine: Mod assist +2   General bed mobility comments: Pt with less ability to assist getting to sitting today secondary to c/o pain and apparent anxiety driven impulsivity.  Pt very hesitant  about brace as it "chokes" him (sternal support well below clavicals) and the sensation of pressure on R flank was "too much, it's killing me"  Transfers                 General transfer comment: Despite pain and discomfort pt was willing to try to get to recliner using slide board but despite decent UE strength he struggled to to chair push up (even with assist) to scoot <1" and had too much pain and anxiety to safely attempt  Ambulation/Gait                 Stairs             Wheelchair Mobility    Modified Rankin (Stroke Patients Only)       Balance Overall balance assessment: Needs assistance Sitting-balance support: Bilateral upper extremity supported Sitting balance-Leahy Scale: Poor Sitting balance - Comments: Pt needed more assist this date to maintain balance in sitting EOB.  He did not tolerate sitting or wearing brace well at all and had multiple "LOBs" secondary to pain this date that he did not have on eval                                    Cognition Arousal/Alertness: Awake/alert Behavior During Therapy: Restless;Impulsive Overall Cognitive Status: Within Functional Limits for tasks assessed  General Comments: Pt made genuine effort t/o the session      Exercises General Exercises - Lower Extremity Quad Sets: Strengthening;10 reps;Both(popliteal resistance for quad and hip extension) Short Arc Quad: AROM;10 reps;Both;AAROM(very light resistance on R, AAROM on L) Hip ABduction/ADduction: Strengthening;AROM;10 reps;Both(pt with great effort, does not have great quality of motion ) Straight Leg Raises: AAROM;PROM;10 reps;Both    General Comments        Pertinent Vitals/Pain Pain Assessment: 0-10 Pain Score: 8  Pain Location: t/o lumbar area, but considerable pain in R flank deep as well as with light superficial touch    Home Living                      Prior  Function            PT Goals (current goals can now be found in the care plan section) Progress towards PT goals: Progressing toward goals    Frequency    Min 2X/week      PT Plan Current plan remains appropriate    Co-evaluation              AM-PAC PT "6 Clicks" Mobility   Outcome Measure  Help needed turning from your back to your side while in a flat bed without using bedrails?: A Little Help needed moving from lying on your back to sitting on the side of a flat bed without using bedrails?: A Little Help needed moving to and from a bed to a chair (including a wheelchair)?: Total Help needed standing up from a chair using your arms (e.g., wheelchair or bedside chair)?: Total Help needed to walk in hospital room?: Total Help needed climbing 3-5 steps with a railing? : Total 6 Click Score: 10    End of Session Equipment Utilized During Treatment: Gait belt Activity Tolerance: Patient limited by pain;Patient limited by fatigue Patient left: with bed alarm set;with call bell/phone within reach Nurse Communication: Mobility status PT Visit Diagnosis: Muscle weakness (generalized) (M62.81);Difficulty in walking, not elsewhere classified (R26.2);Pain Pain - Right/Left: Right Pain - part of body: Hip(lumbago)     Time: [id number] PT Time Calculation (min) (ACUTE ONLY): 40 min  Charges:  $Therapeutic Exercise: 23-37 mins $Therapeutic Activity: 8-22 mins                     Malachi ProGalen R Donnovan Stamour, DPT 02/27/2019, 1:22 PM

## 2019-02-27 NOTE — Progress Notes (Signed)
Rehab Admissions Coordinator Note:  Per PT this patient was screened by Nanine Means for appropriateness for an Inpatient Acute Rehab Consult.  At this time, we would like to see if pt able to tolerate transfer OOB and at what assist level to determine if he has potential to make significant gains in CIR prior to return to SNF setting. AC has contacted PT to request transfer attempt today, if appropriate. Will continue to follow and based on treatment session, may request consult order.   Nanine Means 02/27/2019, 9:45 AM  I can be reached at [phone].

## 2019-02-27 NOTE — Progress Notes (Signed)
Inpatient Rehabilitation-Admissions Coordinator   Received call from [facility] Deliliah regarding this patient and his progress. Per Deliliah, PT no longer recommending CIR based on current treatment session today. AC will not pursue CIR admission and will no longer follow.   Please call if questions.   Nanine Means, OTR/L  Rehab Admissions Coordinator  [phone] 02/27/2019 12:18 PM

## 2019-02-27 NOTE — Progress Notes (Signed)
[facility] -  at [facility]   PATIENT NAME: Eugene [name]    MR#:  [id number]  DATE OF BIRTH:  [birth year]  SUBJECTIVE:  CHIEF COMPLAINT:  No chief complaint on file.  The patient complains of back pain and leg pain, he wants methadone. REVIEW OF SYSTEMS:  Review of Systems  Constitutional: Positive for malaise/fatigue. Negative for chills and fever.  HENT: Negative for sore throat.   Eyes: Negative for blurred vision and double vision.  Respiratory: Negative for cough, hemoptysis, shortness of breath, wheezing and stridor.   Cardiovascular: Negative for chest pain, palpitations, orthopnea and leg swelling.  Gastrointestinal: Negative for abdominal pain, blood in stool, diarrhea, melena, nausea and vomiting.  Genitourinary: Negative for dysuria, flank pain and hematuria.  Musculoskeletal: Positive for back pain and myalgias. Negative for joint pain.  Skin: Negative for rash.  Neurological: Negative for dizziness, sensory change, focal weakness, seizures, loss of consciousness, weakness and headaches.  Endo/Heme/Allergies: Negative for polydipsia.  Psychiatric/Behavioral: Negative for depression. The patient is not nervous/anxious.     DRUG ALLERGIES:  No Known Allergies VITALS:  Blood pressure (!) 141/81, pulse 61, temperature 98.2 F (36.8 C), resp. rate 16, height 6' (1.829 m), weight 106 kg, SpO2 99 %. PHYSICAL EXAMINATION:  Physical Exam Constitutional:      General: He is not in acute distress. HENT:     Head: Normocephalic.     Mouth/Throat:     Mouth: Mucous membranes are moist.  Eyes:     General: No scleral icterus.    Conjunctiva/sclera: Conjunctivae normal.     Pupils: Pupils are equal, round, and reactive to light.  Neck:     Musculoskeletal: Normal range of motion and neck supple.     Vascular: No JVD.     Trachea: No tracheal deviation.  Cardiovascular:     Rate and Rhythm: Normal rate and regular rhythm.     Heart sounds:  Normal heart sounds. No murmur. No gallop.   Pulmonary:     Effort: Pulmonary effort is normal. No respiratory distress.     Breath sounds: Normal breath sounds. No wheezing or rales.  Abdominal:     General: Bowel sounds are normal. There is no distension.     Palpations: Abdomen is soft.     Tenderness: There is no abdominal tenderness. There is no rebound.  Musculoskeletal:        General: No tenderness.     Comments: Bilateral BKA.  Skin:    Findings: No erythema or rash.  Neurological:     General: No focal deficit present.     Mental Status: He is alert and oriented to person, place, and time.     Cranial Nerves: No cranial nerve deficit.  Psychiatric:        Mood and Affect: Mood normal.    LABORATORY PANEL:  Male CBC Recent Labs  Lab 02/26/19 0546  WBC 6.1  HGB 7.6*  HCT 23.9*  PLT 298   ------------------------------------------------------------------------------------------------------------------ Chemistries  Recent Labs  Lab 02/26/19 0546  02/27/19 0711  NA 132*  --  132*  K 5.6*   < > 5.1  CL 100  --  102  CO2 23  --  22  GLUCOSE 189*  --  162*  BUN 38*  --  30*  CREATININE 1.23  --  1.04  CALCIUM 8.3*  --  8.3*  MG 2.4  --   --   AST 109*  --   --  ALT 49*  --   --   ALKPHOS 135*  --   --   BILITOT 0.4  --   --    < > = values in this interval not displayed.   RADIOLOGY:  No results found. ASSESSMENT AND PLAN:   #1.  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  He underwent T8-T9 transpedicular corpectomies and T5-T11 posterolateral arthrodesis, with T5-11 pedicle screw fixation and the T7-10 laminectomies with open reduction of pathological fractures, status post T8-T9 transpedicular corpectomies and T5-T11 posterolateral arthrodesis.. Pain management.  The plan was to continue IV daptomycin  and p.o. rifampin to complete 6 weeks of therapy from 5/21 till 03/25/2019 that should cover his MRSA discitis/osteomyelitis.    [facility] per Dr. Marcell BarlowYarborough. Since the patient has rhabdomyolysis with elevated CK.  Daptomycin was discontinued.  Zyvox is started per Dr. Earlyne Ibaivashanker.  2.  MRSA bacteremia and septicemia.    As above.  3.  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  Most recent hemoglobin A1c was 6.3 on 02/08/2019.    continue Lantus basal coverage as well as continue supplemental coverage with Humalog.  4.  Hypertension.  continue his antihypertensives.  5.  Tobacco abuse.    Smoking cessation was counseled for 3 to 4 minutes.  6.  Peripheral neuropathy.  Lyrica will be continued.  Elevated CK due to daptomycin.  Worsening CK.  Discontinued daptomycin, IV fluid support and follow-up CK.  Generalized weakness.  PT evaluation suggested CIR.  Discussed with Dr. Rayvon Charivasshanker. All the records are reviewed and case discussed with Care Management/Social Worker. Management plans discussed with the patient, family and they are in agreement.  CODE STATUS: Full Code  TOTAL TIME TAKING CARE OF THIS PATIENT: 33 minutes.   More than 50% of the time was spent in counseling/coordination of care: YES  POSSIBLE D/C IN 3 DAYS, DEPENDING ON CLINICAL CONDITION.   Shaune PollackQing Navaya Wiatrek M.D on 02/27/2019 at 12:55 PM  Between 7am to 6pm - Pager - [phone]  After 6pm go to [email or web address] - Therapist, nutritionalpassword [email or web address] [email or web address]  [facility] [place] [facility]

## 2019-02-27 NOTE — TOC Initial Note (Signed)
Transition of Care [facility]) - Initial/Assessment Note    Patient Details  Name: Eugene [name] MRN: [id number] Date of Birth: 06-29-1961  Transition of Care [facility]) CM/SW Contact:    Su Hilt, RN Phone Number: 02/27/2019, 3:26 PM  Clinical Narrative:                 Met with the patient to discuss DC plan and needs He has been living at [facility] care long term care and is willing to go back to the facility, I spoke with [name] at [place] care and she stated that they would accept the patient back, he will transport via EMS at [place]  Expected Discharge Plan: [place] Barriers to Discharge: Continued Medical Work up   Patient Goals and CMS Choice Patient states their goals for this hospitalization and ongoing recovery are:: get better CMS Medicare.gov Compare Post Acute Care list provided to:: Patient Choice offered to / list presented to : Patient  Expected Discharge Plan and Services Expected Discharge Plan: [place]   Discharge Planning Services: CM Consult Post Acute Care Choice: [place] Living arrangements for the past 2 months: [place]                                      Prior Living Arrangements/Services Living arrangements for the past 2 months: [place] Lives with:: Facility Resident Patient language and need for interpreter reviewed:: No Do you feel safe going back to the place where you live?: Yes      Need for Family Participation in Patient Care: No (Comment) Care giver support system in place?: Yes (comment)   Criminal Activity/Legal Involvement Pertinent to Current Situation/Hospitalization: No - Comment as needed  Activities of Daily Living Home Assistive Devices/Equipment: None ADL Screening (condition at time of admission) Patient's cognitive ability adequate to safely complete daily activities?: Yes Is the patient deaf or have difficulty hearing?:  No Does the patient have difficulty seeing, even when wearing glasses/contacts?: No Does the patient have difficulty concentrating, remembering, or making decisions?: No Patient able to express need for assistance with ADLs?: Yes Does the patient have difficulty dressing or bathing?: Yes Independently performs ADLs?: No Communication: Independent Dressing (OT): Needs assistance Is this a change from baseline?: Pre-admission baseline Grooming: Independent Is this a change from baseline?: Pre-admission baseline Feeding: Independent Bathing: Needs assistance Is this a change from baseline?: Pre-admission baseline Toileting: Needs assistance Is this a change from baseline?: Pre-admission baseline In/Out Bed: Dependent Is this a change from baseline?: Pre-admission baseline Does the patient have difficulty walking or climbing stairs?: Yes Weakness of Legs: Both Weakness of Arms/Hands: None  Permission Sought/Granted Permission sought to share information with : Case Manager, Customer service manager Permission granted to share information with : Yes, Verbal Permission Granted     Permission granted to share info w AGENCY: snf        Emotional Assessment Appearance:: Appears stated age Attitude/Demeanor/Rapport: Engaged Affect (typically observed): Accepting Orientation: : Oriented to Self, Oriented to Place, Oriented to  Time, Oriented to Situation Alcohol / Substance Use: Tobacco Use Psych Involvement: No (comment)  Admission diagnosis:  OSTEOMYLITIS Patient Active Problem List   Diagnosis Date Noted  . MRSA bacteremia 02/08/2019  . Osteomyelitis of thoracic spine ([place]) 02/08/2019  . Peripheral vascular disease ([place]) 02/08/2019  . Hypertension 02/08/2019  . Dyslipidemia 02/08/2019  . Chronic  pain syndrome 02/08/2019  . Polysubstance abuse ([place]) 02/08/2019  . Chronic hepatitis C without hepatic coma ([place]) 02/08/2019  . GERD (gastroesophageal reflux disease)  02/08/2019  . Cigarette smoker 02/08/2019  . Normocytic anemia 02/08/2019  . UTI (urinary tract infection) 02/07/2019  . AKI (acute kidney injury) ([place]) 02/07/2019  . Hyperkalemia 02/07/2019  . Diabetes ([place]) 02/07/2019  . Pressure injury of skin 02/07/2019   PCP:  Donato Schultz, MD Pharmacy:  No Pharmacies Listed    Social Determinants of Health (SDOH) Interventions    Readmission Risk Interventions No flowsheet data found.

## 2019-02-27 NOTE — TOC Progression Note (Signed)
Transition of Care [facility]) - Progression Note    Patient Details  Name: Eugene [name] MRN: [id number] Date of Birth: 06-13-1961  Transition of Care [facility]) CM/SW Contact  Barrie Dunker, RN Phone Number: 02/27/2019, 9:10 AM  Clinical Narrative:      Wilfrid Lund at [facility] and requested a screening and eval to accept this patient , I left my contact information for a call back      Expected Discharge Plan and Services                                                 Social Determinants of Health (SDOH) Interventions    Readmission Risk Interventions No flowsheet data found.

## 2019-02-27 NOTE — NC FL2 (Signed)
[place] MEDICAID FL2 LEVEL OF CARE SCREENING TOOL     IDENTIFICATION  Patient Name: Eugene [name] Birthdate: 05-13-1961 Sex: male Admission Date (Current Location): 02/26/2019  [place]   Facility and Address:  [facility], [address], [place], [place] [address]      Provider Number: [id number]  Attending Physician Name and Address:  Shaune Pollackhen, Qing, MD  Relative Name and Phone Number:  Josem KaufmannLance Tortorella [phone]    Current Level of Care: Hospital Recommended Level of Care: Skilled Nursing Facility Prior Approval Number:    Date Approved/Denied:   PASRR Number:    Discharge Plan: SNF    Current Diagnoses: Patient Active Problem List   Diagnosis Date Noted  . MRSA bacteremia 02/08/2019  . Osteomyelitis of thoracic spine (HCC) 02/08/2019  . Peripheral vascular disease (HCC) 02/08/2019  . Hypertension 02/08/2019  . Dyslipidemia 02/08/2019  . Chronic pain syndrome 02/08/2019  . Polysubstance abuse (HCC) 02/08/2019  . Chronic hepatitis C without hepatic coma (HCC) 02/08/2019  . GERD (gastroesophageal reflux disease) 02/08/2019  . Cigarette smoker 02/08/2019  . Normocytic anemia 02/08/2019  . UTI (urinary tract infection) 02/07/2019  . AKI (acute kidney injury) (HCC) 02/07/2019  . Hyperkalemia 02/07/2019  . Diabetes (HCC) 02/07/2019  . Pressure injury of skin 02/07/2019    Orientation RESPIRATION BLADDER Height & Weight     Self, Time, Situation, Place  Normal Incontinent Weight: 106 kg Height:  6' (182.9 cm)  BEHAVIORAL SYMPTOMS/MOOD NEUROLOGICAL BOWEL NUTRITION STATUS      Incontinent Diet(regular)  AMBULATORY STATUS COMMUNICATION OF NEEDS Skin   Total Care Verbally Normal, Surgical wounds                       Personal Care Assistance Level of Assistance  Bathing, Dressing Bathing Assistance: Maximum assistance   Dressing Assistance: Maximum assistance Total Care Assistance: Maximum assistance    Functional Limitations Info  Sight, Hearing, Speech Sight Info: Adequate Hearing Info: Adequate Speech Info: Adequate    SPECIAL CARE FACTORS FREQUENCY  PT (By licensed PT)     PT Frequency: 5 times per week              Contractures Contractures Info: Not present    Additional Factors Info  Code Status Code Status Info: full             Current Medications (02/27/2019):  This is the current hospital active medication list Current Facility-Administered Medications  Medication Dose Route Frequency Provider Last Rate Last Dose  . 0.9 %  sodium chloride infusion   Intravenous Continuous Shaune Pollackhen, Qing, MD 125 mL/hr at 02/27/19 [phone]    . acetaminophen (TYLENOL) tablet 650 mg  650 mg Oral Q6H PRN Mansy, Jan A, MD       Or  . acetaminophen (TYLENOL) suppository 650 mg  650 mg Rectal Q6H PRN Mansy, Jan A, MD      . amLODipine (NORVASC) tablet 10 mg  10 mg Oral Daily Mansy, Jan A, MD   10 mg at 02/27/19 1113  . bisacodyl (DULCOLAX) EC tablet 10 mg  10 mg Oral Daily Mansy, Jan A, MD   10 mg at 02/27/19 0841  . bisacodyl (DULCOLAX) suppository 10 mg  10 mg Rectal Daily Mansy, Jan A, MD      . diphenhydrAMINE (BENADRYL) capsule 50 mg  50 mg Oral Q6H PRN Mansy, Jan A, MD   50 mg at 02/27/19 0855  . docusate sodium (COLACE)  capsule 100 mg  100 mg Oral BID Mansy, Jan A, MD   100 mg at 02/27/19 0842  . famotidine (PEPCID) tablet 20 mg  20 mg Oral BID Mansy, Jan A, MD   20 mg at 02/27/19 0842  . ferrous gluconate (FERGON) tablet 324 mg  324 mg Oral BID WC Mansy, Jan A, MD   324 mg at 02/27/19 0842  . furosemide (LASIX) tablet 40 mg  40 mg Oral BID Mansy, Jan A, MD   40 mg at 02/26/19 1728  . heparin injection 5,000 Units  5,000 Units Subcutaneous Q12H Mansy, Vernetta Honey, MD   5,000 Units at 02/27/19 [phone]  . hydrALAZINE (APRESOLINE) tablet 50 mg  50 mg Oral TID Mansy, Jan A, MD   50 mg at 02/26/19 2054  . HYDROcodone-acetaminophen (NORCO/VICODIN) 5-325 MG per tablet 1-2 tablet  1-2 tablet Oral  Q4H PRN Mansy, Vernetta Honey, MD   2 tablet at 02/27/19 0052  . insulin aspart (novoLOG) injection 0-15 Units  0-15 Units Subcutaneous TID [facility] Mansy, Vernetta Honey, MD   9 Units at 02/27/19 1344  . insulin glargine (LANTUS) injection 20 Units  20 Units Subcutaneous QHS Mansy, Vernetta Honey, MD   20 Units at 02/26/19 2127  . lactulose (CHRONULAC) 10 GM/15ML solution 30 g  30 g Oral BID Mansy, Jan A, MD   30 g at 02/27/19 0841  . linaclotide (LINZESS) capsule 290 mcg  290 mcg Oral QAC breakfast Mansy, Jan A, MD   290 mcg at 02/27/19 0841  . linezolid (ZYVOX) IVPB 600 mg  600 mg Intravenous Q12H Ravishankar, Rhodia Albright, MD 300 mL/hr at 02/27/19 1349 600 mg at 02/27/19 1349  . magnesium hydroxide (MILK OF MAGNESIA) suspension 30 mL  30 mL Oral Daily PRN Mansy, Jan A, MD      . methocarbamol (ROBAXIN) tablet 1,000 mg  1,000 mg Oral QID Mansy, Jan A, MD   1,000 mg at 02/27/19 1345  . metoprolol succinate (TOPROL-XL) 24 hr tablet 25 mg  25 mg Oral Daily Mansy, Jan A, MD   25 mg at 02/27/19 1113  . ondansetron (ZOFRAN) tablet 4 mg  4 mg Oral Q6H PRN Mansy, Jan A, MD       Or  . ondansetron [facility]) injection 4 mg  4 mg Intravenous Q6H PRN Mansy, Jan A, MD      . oxyCODONE (Oxy IR/ROXICODONE) immediate release tablet 10 mg  10 mg Oral Q4H PRN Mansy, Jan A, MD   10 mg at 02/27/19 1345  . polyethylene glycol (MIRALAX / GLYCOLAX) packet 17 g  17 g Oral Daily Mansy, Jan A, MD   17 g at 02/26/19 0954  . pregabalin (LYRICA) capsule 75 mg  75 mg Oral BID Mansy, Jan A, MD   75 mg at 02/27/19 1113  . rifampin (RIFADIN) capsule 600 mg  600 mg Oral Daily Mansy, Jan A, MD   600 mg at 02/27/19 0842  . sodium phosphate (FLEET) 7-19 GM/118ML enema 1 enema  1 enema Rectal Q48H PRN Mansy, Jan A, MD      . traZODone (DESYREL) tablet 150 mg  150 mg Oral QHS Mansy, Jan A, MD   150 mg at 02/26/19 2041     Discharge Medications: Please see discharge summary for a list of discharge medications.  Relevant Imaging Results:  Relevant Lab  Results:   Additional Information [id number]  Barrie Dunker, RN

## 2019-02-28 LAB — GLUCOSE, CAPILLARY
Glucose-Capillary: 137 mg/dL — ABNORMAL HIGH (ref 70–99)
Glucose-Capillary: 205 mg/dL — ABNORMAL HIGH (ref 70–99)
Glucose-Capillary: 191 mg/dL — ABNORMAL HIGH (ref 70–99)
Glucose-Capillary: 245 mg/dL — ABNORMAL HIGH (ref 70–99)

## 2019-02-28 LAB — BASIC METABOLIC PANEL
Anion gap: 9 (ref 5–15)
BUN: 19 mg/dL (ref 6–20)
CO2: 21 mmol/L — ABNORMAL LOW (ref 22–32)
Calcium: 8.1 mg/dL — ABNORMAL LOW (ref 8.9–10.3)
Chloride: 103 mmol/L (ref 98–111)
Creatinine, Ser: 0.77 mg/dL (ref 0.61–1.24)
GFR calc Af Amer: >60 mL/min (ref >60)
GFR calc non Af Amer: >60 mL/min (ref >60)
Glucose, Bld: 212 mg/dL — ABNORMAL HIGH (ref 70–99)
Potassium: 4.4 mmol/L (ref 3.5–5.1)
Sodium: 133 mmol/L — ABNORMAL LOW (ref 135–145)

## 2019-02-28 LAB — CK: Total CK: 5936 U/L — ABNORMAL HIGH (ref 49–397)

## 2019-02-28 LAB — HEMOGLOBIN: Hemoglobin: 7.4 g/dL — ABNORMAL LOW (ref 13.0–17.0)

## 2019-02-28 NOTE — Progress Notes (Signed)
Pharmacy Antibiotic Note  Eugene [name] is a 58 y.o. male admitted on 02/26/2019 with MRSA bacteremia s/t disc osteomyelitis.  Pharmacy has been consulted for daptomycin dosing. Patient is a transfer from [facility] where he has been followed for a spinal osteomyelitis/  Patient was here on 05/19 for AKI and prior to that had seen vascular for PICC line placement for outpatient daptomycin therapy for MRSA Osteomyelitis of spine. 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 Changed to Linezolid. Once CK normalized, plan to try Dapto without the pravastatin and see if can tolerate without causing Rhabdo.He is going to need IV antibiotics until March 26, 2019.   6/5 CK 7441 6/6 CK 5936    Height: 6' (182.9 cm) Weight: 233 lb 11 oz (106 kg) IBW/kg (Calculated) : 77.6  Temp (24hrs), Avg:99.2 F (37.3 C), Min:99.1 F (37.3 C), Max:99.3 F (37.4 C)  Recent Labs  Lab 02/26/19 0546 02/27/19 0711 02/28/19 1040  WBC 6.1  --   --   CREATININE 1.23 1.04 0.77    Estimated Creatinine Clearance: 131.3 mL/min (by C-G formula based on SCr of 0.77 mg/dL).    No Known Allergies   Thank you for allowing pharmacy to be a part of this patient's care.  Chinita Greenland PharmD Clinical Pharmacist 02/28/2019

## 2019-02-28 NOTE — Progress Notes (Addendum)
Bed positioning wedge obtained from ICU for patient and placed to keep him on his side.  He states he is resting better with it.  He was not able to get comfortable with pillow wedging  Stopped maintenance fluids after consulting with Dr Vianne Bulls

## 2019-02-28 NOTE — Progress Notes (Signed)
[place] at [place] NAME: Eugene [name]    MR#:  [id number]  DATE OF BIRTH:  December 06, 1960  SUBJECTIVE:  CHIEF COMPLAINT:  No chief complaint on file.  Complains of severe back pain, uncomfortable today REVIEW OF SYSTEMS:  Review of Systems  Constitutional: Positive for malaise/fatigue. Negative for chills and fever.  HENT: Negative for sore throat.   Eyes: Negative for blurred vision and double vision.  Respiratory: Negative for cough, hemoptysis, shortness of breath, wheezing and stridor.   Cardiovascular: Negative for chest pain, palpitations, orthopnea and leg swelling.  Gastrointestinal: Negative for abdominal pain, blood in stool, diarrhea, melena, nausea and vomiting.  Genitourinary: Negative for dysuria, flank pain and hematuria.  Musculoskeletal: Positive for back pain and myalgias. Negative for joint pain.  Skin: Negative for rash.  Neurological: Negative for dizziness, sensory change, focal weakness, seizures, loss of consciousness, weakness and headaches.  Endo/Heme/Allergies: Negative for polydipsia.  Psychiatric/Behavioral: Negative for depression. The patient is not nervous/anxious.     DRUG ALLERGIES:  No Known Allergies VITALS:  Blood pressure (!) 143/75, pulse 60, temperature 99.1 F (37.3 C), temperature source Oral, resp. rate 17, height 6' (1.829 m), weight 106 kg, SpO2 99 %. PHYSICAL EXAMINATION:  Physical Exam Constitutional:      General: He is not in acute distress. HENT:     Head: Normocephalic.     Mouth/Throat:     Mouth: Mucous membranes are moist.  Eyes:     General: No scleral icterus.    Conjunctiva/sclera: Conjunctivae normal.     Pupils: Pupils are equal, round, and reactive to light.  Neck:     Musculoskeletal: Normal range of motion and neck supple.     Vascular: No JVD.     Trachea: No tracheal deviation.  Cardiovascular:     Rate and Rhythm: Normal rate and regular rhythm.     Heart  sounds: Normal heart sounds. No murmur. No gallop.   Pulmonary:     Effort: Pulmonary effort is normal. No respiratory distress.     Breath sounds: Normal breath sounds. No wheezing or rales.  Abdominal:     General: Bowel sounds are normal. There is no distension.     Palpations: Abdomen is soft.     Tenderness: There is no abdominal tenderness. There is no rebound.  Musculoskeletal:        General: No tenderness.     Comments: Bilateral BKA.  Skin:    Findings: No erythema or rash.  Neurological:     General: No focal deficit present.     Mental Status: He is alert and oriented to person, place, and time.     Cranial Nerves: No cranial nerve deficit.  Psychiatric:        Mood and Affect: Mood normal.    LABORATORY PANEL:  Male CBC Recent Labs  Lab 02/26/19 0546 02/28/19 1040  WBC 6.1  --   HGB 7.6* 7.4*  HCT 23.9*  --   PLT 298  --    ------------------------------------------------------------------------------------------------------------------ Chemistries  Recent Labs  Lab 02/26/19 0546  02/28/19 1040  NA 132*   < > 133*  K 5.6*   < > 4.4  CL 100   < > 103  CO2 23   < > 21*  GLUCOSE 189*   < > 212*  BUN 38*   < > 19  CREATININE 1.23   < > 0.77  CALCIUM 8.3*   < > 8.1*  MG 2.4  --   --   AST 109*  --   --   ALT 49*  --   --   ALKPHOS 135*  --   --   BILITOT 0.4  --   --    < > = values in this interval not displayed.   RADIOLOGY:  No results found. ASSESSMENT AND PLAN:   #1.  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  He underwent T8-T9 transpedicular corpectomies and T5-T11 posterolateral arthrodesis, with T5-11 pedicle screw fixation and the T7-10 laminectomies with open reduction of pathological fractures, status post T8-T9 transpedicular corpectomies and T5-T11 posterolateral arthrodesis.. Pain  management.  He is on Zyvox, rifampin, and will restart daptomycin once her CPK normalizes, patient needs IV antibiotics for at least 6 weeks until July 2, and after that he needs indefinite suppression for anti-MRSA with doxycycline plus or minus rifampin as per ID note. Since the patient has rhabdomyolysis with elevated CK.  Daptomycin was discontinued.  Zyvox is started per Dr. Rudene Andaavi Shanker,  2.  MRSA bacteremia and septicemia.    As above.  MRSA discitis, osteomyelitis, need for prolonged  course of antibiotics. 3.  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  Most recent hemoglobin A1c was 6.3 on 02/08/2019.    continue Lantus basal coverage as well as continue supplemental coverage with Humalog.  4.  Hypertension.  continue his antihypertensives.  5.  Tobacco abuse.    Smoking cessation was counseled for 3 to 4 minutes.  6.  Peripheral neuropathy.  Lyrica will be continued. Polysubstance abuse.  Discussed with Dr. Avon Gullyavishanker. All the records are reviewed and case discussed with Care Management/Social Worker. Management plans discussed with the patient, family and they are in agreement.  CODE STATUS: Full Code  TOTAL TIME TAKING CARE OF THIS PATIENT: 33 minutes.   More than 50% of the time was spent in counseling/coordination of care: YES  POSSIBLE D/C IN 3 DAYS, DEPENDING ON CLINICAL CONDITION.   Katha HammingSnehalatha Randell Detter M.D on 02/28/2019 at 2:29 PM  Between 7am to 6pm - Pager - [phone]  After 6pm go to [email or web address] - Therapist, nutritionalpassword EPAS [facility]  [employer] [facility] [place] [facility]

## 2019-03-01 LAB — NOVEL CORONAVIRUS, NAA (HOSP ORDER, SEND-OUT TO REF LAB; TAT 18-24 HRS): SARS-CoV-2, NAA: NOT DETECTED

## 2019-03-01 LAB — GLUCOSE, CAPILLARY
Glucose-Capillary: 120 mg/dL — ABNORMAL HIGH (ref 70–99)
Glucose-Capillary: 169 mg/dL — ABNORMAL HIGH (ref 70–99)
Glucose-Capillary: 186 mg/dL — ABNORMAL HIGH (ref 70–99)
Glucose-Capillary: 177 mg/dL — ABNORMAL HIGH (ref 70–99)

## 2019-03-01 LAB — CK: Total CK: 3611 U/L — ABNORMAL HIGH (ref 49–397)

## 2019-03-01 MED ORDER — SODIUM CHLORIDE 0.9 % IV SOLN
INTRAVENOUS | Status: DC | PRN
  Administered 2019-03-01: 500 mL via INTRAVENOUS

## 2019-03-01 MED ORDER — OXYCODONE HCL ER 10 MG PO T12A
10.0000 mg | EXTENDED_RELEASE_TABLET | Freq: Two times a day (BID) | ORAL | Status: DC
  Administered 2019-03-01 – 2019-03-02 (×2): 10 mg via ORAL
  Filled 2019-03-01 (×2): qty 1

## 2019-03-01 NOTE — Progress Notes (Signed)
[place] at [place] NAME: Eugene [name]    MR#:  [id number]  DATE OF BIRTH:  07-22-1961  SUBJECTIVE:  CHIEF COMPLAINT:  No chief complaint on file.   patient complains of severe back pain, constipation requesting different pain medicine. REVIEW OF SYSTEMS:  Review of Systems  Constitutional: Positive for malaise/fatigue. Negative for chills and fever.  HENT: Negative for sore throat.   Eyes: Negative for blurred vision and double vision.  Respiratory: Negative for cough, hemoptysis, shortness of breath, wheezing and stridor.   Cardiovascular: Negative for chest pain, palpitations, orthopnea and leg swelling.  Gastrointestinal: Negative for abdominal pain, blood in stool, diarrhea, melena, nausea and vomiting.  Genitourinary: Negative for dysuria, flank pain and hematuria.  Musculoskeletal: Positive for back pain and myalgias. Negative for joint pain.  Skin: Negative for rash.  Neurological: Negative for dizziness, sensory change, focal weakness, seizures, loss of consciousness, weakness and headaches.  Endo/Heme/Allergies: Negative for polydipsia.  Psychiatric/Behavioral: Negative for depression. The patient is not nervous/anxious.     DRUG ALLERGIES:  No Known Allergies VITALS:  Blood pressure (!) 105/58, pulse 69, temperature 98.6 F (37 C), temperature source Oral, resp. rate 15, height 6' (1.829 m), weight 106 kg, SpO2 98 %. PHYSICAL EXAMINATION:  Physical Exam Constitutional:      General: He is not in acute distress. HENT:     Head: Normocephalic.     Mouth/Throat:     Mouth: Mucous membranes are moist.  Eyes:     General: No scleral icterus.    Conjunctiva/sclera: Conjunctivae normal.     Pupils: Pupils are equal, round, and reactive to light.  Neck:     Musculoskeletal: Normal range of motion and neck supple.     Vascular: No JVD.     Trachea: No tracheal deviation.  Cardiovascular:     Rate and Rhythm: Normal  rate and regular rhythm.     Heart sounds: Normal heart sounds. No murmur. No gallop.   Pulmonary:     Effort: Pulmonary effort is normal. No respiratory distress.     Breath sounds: Normal breath sounds. No wheezing or rales.  Abdominal:     General: Bowel sounds are normal. There is no distension.     Palpations: Abdomen is soft.     Tenderness: There is no abdominal tenderness. There is no rebound.  Musculoskeletal:        General: No tenderness.     Comments: Bilateral BKA.  Skin:    Findings: No erythema or rash.  Neurological:     General: No focal deficit present.     Mental Status: He is alert and oriented to person, place, and time.     Cranial Nerves: No cranial nerve deficit.  Psychiatric:        Mood and Affect: Mood normal.    LABORATORY PANEL:  Male CBC Recent Labs  Lab 02/26/19 0546 02/28/19 1040  WBC 6.1  --   HGB 7.6* 7.4*  HCT 23.9*  --   PLT 298  --    ------------------------------------------------------------------------------------------------------------------ Chemistries  Recent Labs  Lab 02/26/19 0546  02/28/19 1040  NA 132*   < > 133*  K 5.6*   < > 4.4  CL 100   < > 103  CO2 23   < > 21*  GLUCOSE 189*   < > 212*  BUN 38*   < > 19  CREATININE 1.23   < > 0.77  CALCIUM 8.3*   < >  8.1*  MG 2.4  --   --   AST 109*  --   --   ALT 49*  --   --   ALKPHOS 135*  --   --   BILITOT 0.4  --   --    < > = values in this interval not displayed.   RADIOLOGY:  No results found. ASSESSMENT AND PLAN:   #1.  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  He underwent T8-T9 transpedicular corpectomies and T5-T11 posterolateral arthrodesis, with T5-11 pedicle screw fixation and the T7-10 laminectomies with open reduction of pathological fractures, status post T8-T9 transpedicular corpectomies and T5-T11  posterolateral arthrodesis.. Pain management.  He is on Zyvox, rifampin, and will restart daptomycin once her CPK normalizes, patient needs IV antibiotics for at least 6 weeks until July 2, and after that he needs indefinite suppression for anti-MRSA with doxycycline plus or minus rifampin as per ID note. Since the patient has rhabdomyolysis with elevated CK.  Daptomycin was discontinued.  Zyvox is started per Dr. Rudene Andaavi Shanker,  2.  MRSA bacteremia and septicemia.    As above.  MRSA discitis, osteomyelitis, need for prolonged  course of antibiotics. 3.  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  Most recent hemoglobin A1c was 6.3 on 02/08/2019.    continue Lantus basal coverage as well as continue supplemental coverage with Humalog.  4.  Hypertension.  continue his antihypertensives.  5.  Tobacco abuse.    Smoking cessation was counseled for 3 to 4 minutes.  6.  Peripheral neuropathy.  Lyrica will be continued. Polysubstance abuse. Rhabdomyolysis, likely secondary to daptomycin, improved after stopping daptomycin, patient CK down to 3611 today it was as high as 7400.  We discontinued daptomycin, statins. #7. severe back pain secondary to recent back surgery, continue short-acting oxycodone at the same dose, added OxyContin at 10 mg every 12 hours for smoother pain control,  #8. constipation, continue stool softeners with Colace, Dulcolax,  lactulose as needed   all the records are reviewed and case discussed with Care Management/Social Worker. Management plans discussed with the patient, family and they are in agreement.  CODE STATUS: Full Code  TOTAL TIME TAKING CARE OF THIS PATIENT: 33 minutes.   More than 50% of the time was spent in counseling/coordination of care: YES  POSSIBLE D/C IN 3 DAYS, DEPENDING ON CLINICAL CONDITION.   Katha HammingSnehalatha Florian Chauca M.D on 03/01/2019 at 11:20 AM  Between 7am to 6pm - Pager - [phone]  After 6pm go to [email or web address] -  Therapist, nutritionalpassword [email or web address]  [employer] [facility] [place] [facility]

## 2019-03-01 NOTE — Plan of Care (Signed)
  Problem: Activity: Goal: Risk for activity intolerance will decrease Outcome: Progressing   Problem: Coping: Goal: Level of anxiety will decrease Outcome: Progressing   Problem: Pain Managment: Goal: General experience of comfort will improve Outcome: Progressing   Problem: Education: Goal: Verbalization of understanding the information provided (i.e., activity precautions, restrictions, etc) will improve Outcome: Progressing Goal: Individualized Educational Video(s) Outcome: Progressing   Problem: Activity: Goal: Ability to ambulate and perform ADLs will improve Outcome: Progressing   Problem: Clinical Measurements: Goal: Postoperative complications will be avoided or minimized Outcome: Progressing   Problem: Self-Concept: Goal: Ability to maintain and perform role responsibilities to the fullest extent possible will improve Outcome: Progressing   Problem: Pain Management: Goal: Pain level will decrease Outcome: Progressing

## 2019-03-01 NOTE — Progress Notes (Signed)
Pharmacy Antibiotic Note  Eugene [name] is a 58 y.o. male admitted on 02/26/2019 with MRSA bacteremia s/t disc osteomyelitis.  Pharmacy has been consulted for daptomycin dosing. Patient is a transfer from [facility] where he has been followed for a spinal osteomyelitis/  Patient was here on 05/19 for AKI and prior to that had seen vascular for PICC line placement for outpatient daptomycin therapy for MRSA Osteomyelitis of spine. 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 Changed to Linezolid. Once CK normalized, plan to try Dapto without the pravastatin per ID and see if can tolerate without causing Rhabdo.He is going to need IV antibiotics until March 26, 2019.   6/5 CK 7441 6/6 CK 5936 6/7 CK 3611    Height: 6' (182.9 cm) Weight: 233 lb 11 oz (106 kg) IBW/kg (Calculated) : 77.6  Temp (24hrs), Avg:98.9 F (37.2 C), Min:98.6 F (37 C), Max:99.3 F (37.4 C)  Recent Labs  Lab 02/26/19 0546 02/27/19 0711 02/28/19 1040  WBC 6.1  --   --   CREATININE 1.23 1.04 0.77    Estimated Creatinine Clearance: 131.3 mL/min (by C-G formula based on SCr of 0.77 mg/dL).    No Known Allergies   Thank you for allowing pharmacy to be a part of this patient's care.  Chinita Greenland PharmD Clinical Pharmacist 03/01/2019

## 2019-03-02 DIAGNOSIS — Z8619 Personal history of other infectious and parasitic diseases: Secondary | ICD-10-CM

## 2019-03-02 DIAGNOSIS — R7881 Bacteremia: Secondary | ICD-10-CM

## 2019-03-02 LAB — CK: Total CK: 1624 U/L — ABNORMAL HIGH (ref 49–397)

## 2019-03-02 LAB — GLUCOSE, CAPILLARY
Glucose-Capillary: 152 mg/dL — ABNORMAL HIGH (ref 70–99)
Glucose-Capillary: 180 mg/dL — ABNORMAL HIGH (ref 70–99)
Glucose-Capillary: 214 mg/dL — ABNORMAL HIGH (ref 70–99)
Glucose-Capillary: 218 mg/dL — ABNORMAL HIGH (ref 70–99)

## 2019-03-02 MED ORDER — METHADONE HCL 10 MG PO TABS: 10.0000 mg | ORAL_TABLET | Freq: Two times a day (BID) | ORAL | Status: DC

## 2019-03-02 MED ORDER — OXYCODONE HCL ER 10 MG PO T12A
10.0000 mg | EXTENDED_RELEASE_TABLET | Freq: Two times a day (BID) | ORAL | Status: DC
  Administered 2019-03-02 – 2019-03-04 (×4): 10 mg via ORAL
  Filled 2019-03-02 (×4): qty 1

## 2019-03-02 NOTE — Progress Notes (Signed)
[place] at [place] NAME: Eugene [name]    MR#:  [id number]  DATE OF BIRTH:  April 11, 1961  SUBJECTIVE: Asking [place] to  restart methadone.  Patient states that he is on methadone for many years.  CHIEF COMPLAINT:  No chief complaint on file.   patient complains of severe back pain, constipation requesting different pain medicine. REVIEW OF SYSTEMS:  Review of Systems  Constitutional: Positive for malaise/fatigue. Negative for chills and fever.  HENT: Negative for sore throat.   Eyes: Negative for blurred vision and double vision.  Respiratory: Negative for cough, hemoptysis, shortness of breath, wheezing and stridor.   Cardiovascular: Negative for chest pain, palpitations, orthopnea and leg swelling.  Gastrointestinal: Negative for abdominal pain, blood in stool, diarrhea, melena, nausea and vomiting.  Genitourinary: Negative for dysuria, flank pain and hematuria.  Musculoskeletal: Positive for back pain and myalgias. Negative for joint pain.  Skin: Negative for rash.  Neurological: Negative for dizziness, sensory change, focal weakness, seizures, loss of consciousness, weakness and headaches.  Endo/Heme/Allergies: Negative for polydipsia.  Psychiatric/Behavioral: Negative for depression. The patient is not nervous/anxious.     DRUG ALLERGIES:  No Known Allergies VITALS:  Blood pressure 119/67, pulse 69, temperature 99.1 F (37.3 C), temperature source Oral, resp. rate 16, height 6' (1.829 m), weight 106 kg, SpO2 100 %. PHYSICAL EXAMINATION:  Physical Exam Constitutional:      General: He is not in acute distress. HENT:     Head: Normocephalic.     Mouth/Throat:     Mouth: Mucous membranes are moist.  Eyes:     General: No scleral icterus.    Conjunctiva/sclera: Conjunctivae normal.     Pupils: Pupils are equal, round, and reactive to light.  Neck:     Musculoskeletal: Normal range of motion and neck supple.     Vascular: No  JVD.     Trachea: No tracheal deviation.  Cardiovascular:     Rate and Rhythm: Normal rate and regular rhythm.     Heart sounds: Normal heart sounds. No murmur. No gallop.   Pulmonary:     Effort: Pulmonary effort is normal. No respiratory distress.     Breath sounds: Normal breath sounds. No wheezing or rales.  Abdominal:     General: Bowel sounds are normal. There is no distension.     Palpations: Abdomen is soft.     Tenderness: There is no abdominal tenderness. There is no rebound.  Musculoskeletal:        General: No tenderness.     Comments: Bilateral BKA.  Skin:    Findings: No erythema or rash.  Neurological:     General: No focal deficit present.     Mental Status: He is alert and oriented to person, place, and time.     Cranial Nerves: No cranial nerve deficit.  Psychiatric:        Mood and Affect: Mood normal.    LABORATORY PANEL:  Male CBC Recent Labs  Lab 02/26/19 0546 02/28/19 1040  WBC 6.1  --   HGB 7.6* 7.4*  HCT 23.9*  --   PLT 298  --    ------------------------------------------------------------------------------------------------------------------ Chemistries  Recent Labs  Lab 02/26/19 0546  02/28/19 1040  NA 132*   < > 133*  K 5.6*   < > 4.4  CL 100   < > 103  CO2 23   < > 21*  GLUCOSE 189*   < > 212*  BUN 38*   < >  19  CREATININE 1.23   < > 0.77  CALCIUM 8.3*   < > 8.1*  MG 2.4  --   --   AST 109*  --   --   ALT 49*  --   --   ALKPHOS 135*  --   --   BILITOT 0.4  --   --    < > = values in this interval not displayed.   RADIOLOGY:  No results found. ASSESSMENT AND PLAN:   #1.  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 of thoracic osteomyelitis with kyphotic deformity.  He underwent T8-T9 transpedicular corpectomies and T5-T11 posterolateral arthrodesis, with T5-11 pedicle screw fixation and the T7-10 laminectomies with open  reduction of pathological fractures, status post T8-T9 transpedicular corpectomies and T5-T11 posterolateral arthrodesis.. Pain management.  He is on Zyvox, rifampin, and will restart daptomycin once her CPK normalizes, patient needs IV antibiotics for at least 6 weeks until July 2, and after that he needs indefinite suppression for anti-MRSA with doxycycline plus or minus rifampin as per ID note. Since the patient has rhabdomyolysis with elevated CK.  Daptomycin was discontinued.  Zyvox is started per Dr. Rudene Andaavi Shanker, patient CK is down  To 1624.  2.  MRSA bacteremia and septicemia.    As above.  MRSA discitis, osteomyelitis, need for prolonged  course of antibiotics. 3.  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  Most recent hemoglobin A1c was 6.3 on 02/08/2019.    continue Lantus basal coverage as well as continue supplemental coverage with Humalog.  4.  Hypertension.  continue his antihypertensives.  5.  Tobacco abuse.    Smoking cessation was counseled for 3 to 4 minutes.  6.  Peripheral neuropathy.  Lyrica will be continued. Polysubstance abuse. Rhabdomyolysis, likely secondary to daptomycin, improved after stopping daptomycin, patient CK down further today it was as high as 7400.  We discontinued daptomycin, statins.  #7 chronic pain syndrome, on methadone for long-term, reviewed patient medical records, patient was on methadone on and off, start at 10 mg p.o. twice daily discontinue OxyContin. #7. . constipation, continue stool softeners with Colace, Dulcolax,  lactulose as needed   all the records are reviewed and case discussed with Care Management/Social Worker. Management plans discussed with the patient, family and they are in agreement.  CODE STATUS: Full Code  TOTAL TIME TAKING CARE OF THIS PATIENT: 33 minutes.   More than 50% of the time was spent in counseling/coordination of care: YES  POSSIBLE D/C IN 3 DAYS, DEPENDING ON CLINICAL CONDITION.    Katha HammingSnehalatha Angeliyah Kirkey M.D on 03/02/2019 at 1:35 PM  Between 7am to 6pm - Pager - [phone]  After 6pm go to [email or web address] - Therapist, nutritionalpassword EPAS [facility]  [employer] [facility] [place] [facility]

## 2019-03-02 NOTE — Progress Notes (Signed)
Date of Admission:  02/26/2019     Eugene [name] is a 58 y.o. male with a history of bilateral BKA multiple MRSA infections in the past including osteomyelitis but more recently with 2 prolonged  MRSA bacteremia and spinal osteomyelitis with epidural abscess is back to [facility] after undergoing surgery at [facility]. Patient has a complicated infectious disease history mostly centered around MRSA.  In February 2020 he was hospitalized in 1 of the [facility] hospital between 11/06/18-11/28/18 with recurrent MRSA bacteremia, chronic osteo of foot  and recurrent wound infection    Blood cultures from 10/30/2018, 11/06/2018, 11/08/2018, 11/10/2018, 11/12/2018, were all positive for MRSA.  Blood culture on 11/14/2018 was negative.  He was treated with IV vancomycin from date of negative culture for 2 weeks.  2/19  TEE was negative  He was readmitted between 12/09/18-01/08/19  For MRSA bacteremia and MRI showed T8-T9 discitis/osteo- He underwent laminectomy on 12/22/18 and left BKA. OR culture had enterococcus . He was treated with 6 weeks of Iv vancomycin to finish on 01/30/19.  Presented to [facility] on 02/06/19 from [place] care of abnormal creatinine-of 2.75  The case was discussed with [facility] physician on call and it was decided to stop antibiotics as he had completed therapy. T8-9 discitis osteomyelitis. 50% loss of height of T8 vertebral body and deformity of T9 superior endplate with 10-25% loss of vertebral body height. 2. Focal kyphosis and 7 mm bony retropulsion of T8 vertebral body impinge the spinal cord. 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 3. Dorsal epidural abscess extends superiorly to T5 level. Small ventral epidural abscess may be present posterior to T9 vertebral body. 4. Dorsal epidural abscess appears to communicate with a collection in the left T8-9 facet and multiloculated collection within dorsal soft tissues. 5. Small anterior paraspinal collections at  the T8-9 disc level measuring 18 mm on right and 10 mm on the left. He was transferred to [facility] and underwent T8-T9 corpectomies with arthrodesis, hardware placed on 02/12/19--.OP cultures <1+ MRSA.Blood culture was neg and TTE was negative.was given daptomycin + rifampin and was transferred back to [facility] to complete IV until 03/25/19. Vanco MIC was 2 for MRSA I am asked to see the  patient for the same  [place] Rt BKA in 2007 In 2015 he had right BKA stump infection with MRSA and was treated. In October 2018 he had MRSA of the left foot which was  surgically debrided. And in November 2018 he again had MRSA and Proteus infection of the left foot and had debridement and was treated with IV antibiotics including vancomycin and cefepime.   12/22/2018 had left BKA.  In May 2019 he had left septic knee arthritis and had MRSA which has had a surgical washout and treated with 4 weeks of vancomycin.   Subjective: Participating with OT  Medications:  . amLODipine  10 mg Oral Daily  . bisacodyl  10 mg Oral Daily  . bisacodyl  10 mg Rectal Daily  . docusate sodium  100 mg Oral BID  . famotidine  20 mg Oral BID  . ferrous gluconate  324 mg Oral BID WC  . furosemide  40 mg Oral BID  . heparin  5,000 Units Subcutaneous Q12H  . hydrALAZINE  50 mg Oral TID  . insulin aspart  0-15 Units Subcutaneous TID AC & HS  . insulin glargine  20 Units Subcutaneous QHS  . lactulose  30 g Oral BID  . linaclotide  290 mcg  Oral QAC breakfast  . methocarbamol  1,000 mg Oral QID  . metoprolol succinate  25 mg Oral Daily  . oxyCODONE  10 mg Oral Q12H  . polyethylene glycol  17 g Oral Daily  . pregabalin  75 mg Oral BID  . rifampin  600 mg Oral Daily  . traZODone  150 mg Oral QHS    Objective: Vital signs in last 24 hours: Temp:  [98.7 F (37.1 C)-99.1 F (37.3 C)] 99.1 F (37.3 C) (06/08 0807) Pulse Rate:  [69-75] 69 (06/08 0807) Resp:  [16] 16 (06/07 1532) BP: (106-130)/(67-71) 119/67 (06/08 0807) SpO2:  [98  %-100 %] 100 % (06/08 0807)  PHYSICAL EXAM:  General: Alert, cooperative, no distress, appears stated age.  B/l BKA- stump fine  Lab Results Recent Labs    02/28/19 1040  HGB 7.4*  NA 133*  K 4.4  CL 103  CO2 21*  BUN 19  CREATININE 0.77     Microbiology:  Studies/Results: No results found.   Assessment/Plan:  58 y.o. male with substance use, history of bilateral BKA multiple MRSA infections in the past including osteomyelitis but more recently with 2 prolonged  MRSA bacteremia and spinal osteomyelitis with epidural abscess is back to [facility]. Patient has a complicated infectious disease history mostly centered around MRSA.  Recurrent MRSA infection. 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  OR cultures had less than 1+ MRSA. ?[facility]atin, daptomycin and rifampin. His CPK is more than 5000.  On June 2 it was around 500. So stopped both daptomycin and pravastatin. started him on linezolid 600 mg twice daily and continue rifampin.  Once the CPK is normalized we can try daptomycin without pravastatin to see whether he will tolerate that without rhabdo.  He is going to need IV antibiotics until March 26, 2019.  And after that he is going to need indefinite suppressive anti-MRSA with doxycycline (+-/-minus rifampin)  Anemia of chronic disease  ? _Bilateral BKA secondary to MRSA infections  Diabetes mellitus  Polysubstance use -he denies IVDA _ Discussed with patient

## 2019-03-02 NOTE — Progress Notes (Signed)
Physical Therapy Treatment Patient Details Name: Eugene [name] MRN: [id number] DOB: [birth year] Today's Date: 03/02/2019    History of Present Illness 58 y.o. 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  Status post T8-T9 transpedicular corpectomies and T5-T11 posterolateral arthrodesis about 3 months ago.  Pt reports he has been unable to do a lot of mobility or even sitting since that time, and reports b/l sensory and strength changes since back surgeries.    PT Comments    Pt in bed with c/o general discomfort in back, restless rolling left and right with rails.  Infectious Disease MD in during session to talk with pt.  Participated in exercises as described below.  Pt voicing frustration regarding pain, weakness BLE's and general decline in physical health and mobility since 2nd amputation.  Pt also voicing frustration in not being able to talk with his family.  Pt unable to call long distance from phone in room.  Called his brother, Micah NoelLance, and spoke with him per pt request asking him to call pt.  Direct room phone number given and pt aware.  Encouragement given.   Follow Up Recommendations  SNF     Equipment Recommendations  Wheelchair (measurements PT);Wheelchair cushion (measurements PT)    Recommendations for Other Services       Precautions / Restrictions Precautions Precautions: Fall Required Braces or Orthoses: Spinal Brace Restrictions Weight Bearing Restrictions: No    Mobility  Bed Mobility               General bed mobility comments: deferred  Transfers                    Ambulation/Gait                 Stairs             Wheelchair Mobility    Modified Rankin (Stroke Patients Only)       Balance                                            Cognition Arousal/Alertness:  Awake/alert Behavior During Therapy: WFL for tasks assessed/performed Overall Cognitive Status: Within Functional Limits for tasks assessed                                        Exercises Other Exercises Other Exercises: BLE P/AAROM 4 x 10 reps for SLR, ab/add and heel slides - pt with only minimal to trace ability to assist.     General Comments        Pertinent Vitals/Pain Pain Assessment: Faces Faces Pain Scale: Hurts whole lot Pain Location: back Pain Descriptors / Indicators: Aching    Home Living                      Prior Function            PT Goals (current goals can now be found in the care plan section) Progress towards PT goals: Progressing toward goals    Frequency    Min 2X/week      PT Plan Current plan remains appropriate    Co-evaluation  AM-PAC PT "6 Clicks" Mobility   Outcome Measure  Help needed turning from your back to your side while in a flat bed without using bedrails?: A Little Help needed moving from lying on your back to sitting on the side of a flat bed without using bedrails?: A Little Help needed moving to and from a bed to a chair (including a wheelchair)?: Total Help needed standing up from a chair using your arms (e.g., wheelchair or bedside chair)?: Total Help needed to walk in hospital room?: Total Help needed climbing 3-5 steps with a railing? : Total 6 Click Score: 10    End of Session Equipment Utilized During Treatment: Gait belt Activity Tolerance: Patient limited by pain Patient left: in bed;with bed alarm set;with call bell/phone within reach   Pain - Right/Left: Right Pain - part of body: Hip     Time: 1341-1410 PT Time Calculation (min) (ACUTE ONLY): 29 min  Charges:  $Therapeutic Exercise: 23-37 mins                    Chesley Noon, PTA 03/02/19, 3:10 PM

## 2019-03-02 NOTE — Progress Notes (Signed)
Methadone discontinued-Case manager told me that patient cannot go to any skilled nursing on methadone so discontinued continued on, restarted OxyContin back.

## 2019-03-02 NOTE — TOC Progression Note (Signed)
Transition of Care [facility]) - Progression Note    Patient Details  Name: Eugene [name] MRN: [id number] Date of Birth: July 24, 1961  Transition of Care [facility]) CM/SW Contact  Su Hilt, RN Phone Number: 03/02/2019, 2:34 PM  Clinical Narrative:    [name] at [facility] called to let me know that they can NOT take the patient back if the patient is on Methadone.  If the patient is not on Methadone they are able to take the patient back for Long term care at [facility]. I notified the physician of this, awaiting direction from the physician.   Expected Discharge Plan: [place] Barriers to Discharge: Continued Medical Work up  Expected Discharge Plan and Services Expected Discharge Plan: [place]   Discharge Planning Services: CM Consult Post Acute Care Choice: [place] Living arrangements for the past 2 months: [place]                                       Social Determinants of Health (SDOH) Interventions    Readmission Risk Interventions No flowsheet data found.

## 2019-03-02 NOTE — TOC Progression Note (Signed)
Transition of Care [facility]) - Progression Note    Patient Details  Name: Eugene [name] MRN: [id number] Date of Birth: 04/11/1961  Transition of Care [facility]) CM/SW Contact  Agusta Hackenberg, Veronia Beets, [place] Phone Number: 03/02/2019, 3:23 PM  Clinical Narrative:   Per [name] admissions coordinator at [facility] they can't accept patient back on Methadone and per [name] patient was not taking methadone while at [facility]. MD is aware of above. Clinical Social Worker (CSW) made patient aware of above. Patient reported that he was taking methadone at [place] provided emotional support and explained that he can't be on methadone at [facility]. Patient verbalized his understanding. RN and MD aware of above. CSW will continue to follow and assist as needed.   [employer], [place] [phone]     Expected Discharge Plan: [place] Barriers to Discharge: Continued Medical Work up  Expected Discharge Plan and Services Expected Discharge Plan: [place]   Discharge Planning Services: CM Consult Post Acute Care Choice: [place] Living arrangements for the past 2 months: [place]                                       Social Determinants of Health (SDOH) Interventions    Readmission Risk Interventions No flowsheet data found.

## 2019-03-02 NOTE — Progress Notes (Signed)
Pharmacy Antibiotic Note  Eugene [name] is a 58 y.o. male admitted on 02/26/2019 with MRSA bacteremia s/t disc osteomyelitis.  Pharmacy has been consulted for daptomycin dosing. Patient is a transfer from [facility] where he has been followed for a spinal osteomyelitis/  Patient was here on 05/19 for AKI and prior to that had seen vascular for PICC line placement for outpatient daptomycin therapy for MRSA Osteomyelitis of spine. 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 Changed to Linezolid. Once CK normalized, plan to try Dapto without the pravastatin per ID and see if can tolerate without causing Rhabdo.He is going to need IV antibiotics until March 26, 2019.   6/5 CK 7441 6/6 CK 5936 6/7 CK 3611  6/8 CK 1624 (RR 49-397 U/L)   Height: 6' (182.9 cm) Weight: 233 lb 11 oz (106 kg) IBW/kg (Calculated) : 77.6  Temp (24hrs), Avg:98.7 F (37.1 C), Min:98.6 F (37 C), Max:98.9 F (37.2 C)  Recent Labs  Lab 02/26/19 0546 02/27/19 0711 02/28/19 1040  WBC 6.1  --   --   CREATININE 1.23 1.04 0.77    Estimated Creatinine Clearance: 131.3 mL/min (by C-G formula based on SCr of 0.77 mg/dL).    No Known Allergies   Thank you for allowing pharmacy to be a part of this patient's care.  Lu Duffel, PharmD, BCPS Clinical Pharmacist 03/02/2019 7:37 AM

## 2019-03-03 LAB — NOVEL CORONAVIRUS, NAA (HOSP ORDER, SEND-OUT TO REF LAB; TAT 18-24 HRS): SARS-CoV-2, NAA: NOT DETECTED

## 2019-03-03 LAB — CBC
HCT: 25.5 % — ABNORMAL LOW (ref 39.0–52.0)
Hemoglobin: 8.1 g/dL — ABNORMAL LOW (ref 13.0–17.0)
MCH: 28.9 pg (ref 26.0–34.0)
MCHC: 31.8 g/dL (ref 30.0–36.0)
MCV: 91.1 fL (ref 80.0–100.0)
Platelets: 294 10*3/uL (ref 150–400)
RBC: 2.8 MIL/uL — ABNORMAL LOW (ref 4.22–5.81)
RDW: 13.6 % (ref 11.5–15.5)
WBC: 4.5 10*3/uL (ref 4.0–10.5)
nRBC: 0 % (ref 0.0–0.2)

## 2019-03-03 LAB — GLUCOSE, CAPILLARY
Glucose-Capillary: 131 mg/dL — ABNORMAL HIGH (ref 70–99)
Glucose-Capillary: 194 mg/dL — ABNORMAL HIGH (ref 70–99)
Glucose-Capillary: 191 mg/dL — ABNORMAL HIGH (ref 70–99)
Glucose-Capillary: 233 mg/dL — ABNORMAL HIGH (ref 70–99)

## 2019-03-03 LAB — CK: Total CK: 574 U/L — ABNORMAL HIGH (ref 49–397)

## 2019-03-03 NOTE — Progress Notes (Signed)
PT Cancellation Note  Patient Details Name: Rishi Vicario MRN: [id number] DOB: 1961-02-13   Cancelled Treatment:    Reason Eval/Treat Not Completed: Other (comment) Chart reviewed. Nursing consulted. Patient requesting pain medication prior to session. Nursing entering to administer medications at PT arrival PT will follow up at a later time/date.  Myles Gip PT, DPT [phone] 03/03/2019, 9:25 AM

## 2019-03-03 NOTE — Progress Notes (Signed)
[facility] NAME: Eugene [name]    MR#:  [id number]  DATE OF BIRTH:  Sep 14, 1961  SUBJECTIVE: Complains of back pain.  No other complaints.  CHIEF COMPLAINT:  No chief complaint on file.   patient complains of severe back pain, constipation requesting different pain medicine. REVIEW OF SYSTEMS:  Review of Systems  Constitutional: Negative for chills, fever and malaise/fatigue.  HENT: Negative for sore throat.   Eyes: Negative for blurred vision and double vision.  Respiratory: Negative for cough, hemoptysis, shortness of breath, wheezing and stridor.   Cardiovascular: Negative for chest pain, palpitations, orthopnea and leg swelling.  Gastrointestinal: Negative for abdominal pain, blood in stool, diarrhea, melena, nausea and vomiting.  Genitourinary: Negative for dysuria, flank pain and hematuria.  Musculoskeletal: Positive for back pain and myalgias. Negative for joint pain.  Skin: Negative for rash.  Neurological: Negative for dizziness, sensory change, focal weakness, seizures, loss of consciousness, weakness and headaches.  Endo/Heme/Allergies: Negative for polydipsia.  Psychiatric/Behavioral: Negative for depression. The patient is not nervous/anxious.     DRUG ALLERGIES:  No Known Allergies VITALS:  Blood pressure 130/63, pulse 72, temperature 98.5 F (36.9 C), resp. rate 16, height 6' (1.829 m), weight 106 kg, SpO2 98 %. PHYSICAL EXAMINATION:  Physical Exam Constitutional:      General: He is not in acute distress. HENT:     Head: Normocephalic.     Mouth/Throat:     Mouth: Mucous membranes are moist.  Eyes:     General: No scleral icterus.    Conjunctiva/sclera: Conjunctivae normal.     Pupils: Pupils are equal, round, and reactive to light.  Neck:     Musculoskeletal: Normal range of motion and neck supple.     Vascular: No JVD.     Trachea: No tracheal deviation.  Cardiovascular:     Rate and Rhythm:  Normal rate and regular rhythm.     Heart sounds: Normal heart sounds. No murmur. No gallop.   Pulmonary:     Effort: Pulmonary effort is normal. No respiratory distress.     Breath sounds: Normal breath sounds. No wheezing or rales.  Abdominal:     General: Bowel sounds are normal. There is no distension.     Palpations: Abdomen is soft.     Tenderness: There is no abdominal tenderness. There is no rebound.  Musculoskeletal:        General: No tenderness.     Comments: Bilateral BKA.  Skin:    Findings: No erythema or rash.  Neurological:     General: No focal deficit present.     Mental Status: He is alert and oriented to person, place, and time.     Cranial Nerves: No cranial nerve deficit.  Psychiatric:        Mood and Affect: Mood normal.    LABORATORY PANEL:  Male CBC Recent Labs  Lab 03/03/19 0614  WBC 4.5  HGB 8.1*  HCT 25.5*  PLT 294   ------------------------------------------------------------------------------------------------------------------ Chemistries  Recent Labs  Lab 02/26/19 0546  02/28/19 1040  NA 132*   < > 133*  K 5.6*   < > 4.4  CL 100   < > 103  CO2 23   < > 21*  GLUCOSE 189*   < > 212*  BUN 38*   < > 19  CREATININE 1.23   < > 0.77  CALCIUM 8.3*   < > 8.1*  MG 2.4  --   --  AST 109*  --   --   ALT 49*  --   --   ALKPHOS 135*  --   --   BILITOT 0.4  --   --    < > = values in this interval not displayed.   RADIOLOGY:  No results found. ASSESSMENT AND PLAN:   #1.  Thoracic 8/9 osteomyelitis with epidural abscess, status post a T7-9 laminectomy on 3/30,  transferred from [facility] to [facility] for evaluation of thoracic osteomyelitis with kyphotic deformity.  He underwent T8-T9 transpedicular corpectomies and T5-T11 posterolateral arthrodesis, with T5-11 pedicle screw fixation and the T7-10 laminectomies with open reduction of pathological fractures, status post T8-T9 transpedicular corpectomies and T5-T11 posterolateral arthrodesis..  Pain management.  He is on Zyvox, rifampin, and will restart daptomycin once her CPK normalizes, patient needs IV antibiotics for at least 6 weeks until July 2, and after that he needs indefinite suppression for anti-MRSA with doxycycline plus or minus rifampin as per ID note. Since the patient has rhabdomyolysis with elevated CK.  Daptomycin was discontinued.  Zyvox is started per Dr. Rudene Andaavi Shanker, patient CK is down  To 574 today.  Will update Dr. Rudene Andaavi Shanker.  Continue Zyvox, rifampin for now..  2.  MRSA bacteremia and septicemia.    As above.  MRSA discitis, osteomyelitis, need for prolonged  course of antibiotics. 3.  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    Controlled.  4.  Hypertension.  continue his antihypertensives.  5.  Tobacco abuse.    Smoking cessation was counseled for 3 to 4 minutes.  6.  Peripheral neuropathy.  Lyrica will be continued. Polysubstance abuse. Rhabdomyolysis, likely secondary to daptomycin, improved after stopping daptomycin, patient CK down further today it was as high as 7400.  We discontinued daptomycin, statins.  #7 chronic pain syndrome, stop her methadone as patient cannot go rehab on methadone.  As per Child psychotherapistsocial worker.  Same explained to the patient.   #7. . constipation, continue stool softeners with Colace, Dulcolax,  lactulose as needed   all the records are reviewed and case discussed with Care Management/Social Worker. Management plans discussed with the patient, family and they are in agreement.  CODE STATUS: Full Code  TOTAL TIME TAKING CARE OF THIS PATIENT: 33 minutes.   More than 50% of the time was spent in counseling/coordination of care: YES  POSSIBLE D/C IN 3 DAYS, DEPENDING ON CLINICAL CONDITION.   Katha HammingSnehalatha Jaymason Ledesma M.D on 03/03/2019 at 12:16 PM  Between 7am to 6pm - Pager - [phone]  After 6pm go to [email or web address] - Therapist, nutritionalpassword [email or web address]  [employer] [place] [facility]

## 2019-03-03 NOTE — Progress Notes (Signed)
Physical Therapy Treatment Patient Details Name: [name] MRN: [id number] DOB: 02/16/1961 Today's Date: 03/03/2019    History of Present Illness 58 y.o. 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  Status post T8-T9 transpedicular corpectomies and T5-T11 posterolateral arthrodesis about 3 months ago.  Pt reports he has been unable to do a lot of mobility or even sitting since that time, and reports b/l sensory and strength changes since back surgeries.    PT Comments    Patient in bed and amenable to therapy although presenting with decreased motivation and increased frustration with current health status. Patient willing to participate in LE strengthening activities but expresses repeatedly that he is paralyzed. Patient able to activate quadriceps bilaterally, R>L, and maintain activation for 3-5 seconds for repeated repetitions. Patient still requiring AAROM for SLR and SAQ. Patient will continue to benefit from skilled therapeutic intervention to address deficits in mobility, balance, activity tolerance, and strength while admitted and upon discharge.   Patient in bed with all needs in reach.    Follow Up Recommendations  SNF     Equipment Recommendations  Wheelchair (measurements PT);Wheelchair cushion (measurements PT)    Recommendations for Other Services       Precautions / Restrictions Precautions Precautions: Fall Required Braces or Orthoses: Spinal Brace Restrictions Weight Bearing Restrictions: No    Mobility  Bed Mobility Overal bed mobility: Needs Assistance Bed Mobility: Rolling Rolling: Supervision         General bed mobility comments: Patient able to utilize bed rails to assist with rolling for the purposes of off-loading the wound on his back.  Transfers                    Ambulation/Gait                  Stairs             Wheelchair Mobility    Modified Rankin (Stroke Patients Only)       Balance                                            Cognition Arousal/Alertness: Awake/alert Behavior During Therapy: WFL for tasks assessed/performed Overall Cognitive Status: Within Functional Limits for tasks assessed                                        Exercises General Exercises - Lower Extremity Quad Sets: Strengthening;Both;15 reps Short Arc Quad: Right;AROM;Left;AAROM;10 reps Hip ABduction/ADduction: Both;AAROM;Strengthening;5 reps(patient states repeatedly he is unable and is paralyzed.  ) Straight Leg Raises: AAROM;PROM;10 reps;Both Other Exercises Other Exercises: Bed mobility to decrease pressure on wound on back. Patient able to use bed raisl to assist, but does require some supervision to ensure safety. Other Exercises: Diaphragmatic breathing training for pain modulation.    General Comments        Pertinent Vitals/Pain Pain Assessment: Faces Faces Pain Scale: Hurts a little bit    Home Living                      Prior Function            PT Goals (current goals  can now be found in the care plan section) Progress towards PT goals: Progressing toward goals    Frequency    Min 2X/week      PT Plan Current plan remains appropriate    Co-evaluation              AM-PAC PT "6 Clicks" Mobility   Outcome Measure  Help needed turning from your back to your side while in a flat bed without using bedrails?: A Little Help needed moving from lying on your back to sitting on the side of a flat bed without using bedrails?: A Little Help needed moving to and from a bed to a chair (including a wheelchair)?: Total Help needed standing up from a chair using your arms (e.g., wheelchair or bedside chair)?: Total Help needed to walk in hospital room?: Total Help needed climbing 3-5 steps with a railing? :  Total 6 Click Score: 10    End of Session   Activity Tolerance: Patient limited by pain;Patient limited by fatigue Patient left: in bed;with bed alarm set;with call bell/phone within reach   PT Visit Diagnosis: Muscle weakness (generalized) (M62.81);Difficulty in walking, not elsewhere classified (R26.2);Pain Pain - Right/Left: Right Pain - part of body: Hip     Time: 2229-7989 PT Time Calculation (min) (ACUTE ONLY): 20 min  Charges:  $Therapeutic Exercise: 8-22 mins                    Myles Gip PT, DPT [phone] 03/03/2019, 11:03 AM

## 2019-03-04 LAB — CBC
HCT: 23.3 % — ABNORMAL LOW (ref 39.0–52.0)
Hemoglobin: 7.6 g/dL — ABNORMAL LOW (ref 13.0–17.0)
MCH: 29.3 pg (ref 26.0–34.0)
MCHC: 32.6 g/dL (ref 30.0–36.0)
MCV: 90 fL (ref 80.0–100.0)
Platelets: 273 10*3/uL (ref 150–400)
RBC: 2.59 MIL/uL — ABNORMAL LOW (ref 4.22–5.81)
RDW: 13.8 % (ref 11.5–15.5)
WBC: 4.6 10*3/uL (ref 4.0–10.5)
nRBC: 0 % (ref 0.0–0.2)

## 2019-03-04 LAB — BASIC METABOLIC PANEL
Anion gap: 7 (ref 5–15)
BUN: 23 mg/dL — ABNORMAL HIGH (ref 6–20)
CO2: 23 mmol/L (ref 22–32)
Calcium: 8.3 mg/dL — ABNORMAL LOW (ref 8.9–10.3)
Chloride: 104 mmol/L (ref 98–111)
Creatinine, Ser: 0.91 mg/dL (ref 0.61–1.24)
GFR calc Af Amer: >60 mL/min (ref >60)
GFR calc non Af Amer: >60 mL/min (ref >60)
Glucose, Bld: 176 mg/dL — ABNORMAL HIGH (ref 70–99)
Potassium: 5.1 mmol/L (ref 3.5–5.1)
Sodium: 134 mmol/L — ABNORMAL LOW (ref 135–145)

## 2019-03-04 LAB — GLUCOSE, CAPILLARY
Glucose-Capillary: 216 mg/dL — ABNORMAL HIGH (ref 70–99)
Glucose-Capillary: 172 mg/dL — ABNORMAL HIGH (ref 70–99)

## 2019-03-04 LAB — CK: Total CK: 245 U/L (ref 49–397)

## 2019-03-04 MED ORDER — MAGNESIUM HYDROXIDE 400 MG/5ML PO SUSP: 30.0000 mL | Freq: Every day | ORAL | 0 refills | Status: DC | PRN

## 2019-03-04 MED ORDER — OXYCODONE HCL ER 10 MG PO T12A: 10.0000 mg | EXTENDED_RELEASE_TABLET | Freq: Two times a day (BID) | ORAL | 0 refills | Status: DC

## 2019-03-04 MED ORDER — DOXYCYCLINE HYCLATE 50 MG PO CAPS: 100.0000 mg | ORAL_CAPSULE | Freq: Two times a day (BID) | ORAL | 2 refills | Status: DC

## 2019-03-04 MED ORDER — INSULIN ASPART 100 UNIT/ML ~~LOC~~ SOLN: 0.0000 [IU] | Freq: Three times a day (TID) | SUBCUTANEOUS | 11 refills | Status: DC

## 2019-03-04 MED ORDER — OXYCODONE HCL 10 MG PO TABS: 10.0000 mg | ORAL_TABLET | ORAL | 0 refills | Status: DC | PRN

## 2019-03-04 MED ORDER — LINEZOLID 600 MG PO TABS
600.0000 mg | ORAL_TABLET | Freq: Two times a day (BID) | ORAL | Status: DC
  Filled 2019-03-04 (×2): qty 1

## 2019-03-04 MED ORDER — RIFAMPIN 300 MG PO CAPS: 300.0000 mg | ORAL_CAPSULE | Freq: Two times a day (BID) | ORAL | Status: DC

## 2019-03-04 MED ORDER — RIFAMPIN 300 MG PO CAPS: 300.0000 mg | ORAL_CAPSULE | Freq: Two times a day (BID) | ORAL | 0 refills | Status: AC

## 2019-03-04 MED ORDER — LINEZOLID 600 MG PO TABS: 600.0000 mg | ORAL_TABLET | Freq: Two times a day (BID) | ORAL | 0 refills | Status: AC

## 2019-03-04 NOTE — TOC Transition Note (Signed)
Transition of Care [facility]) - CM/SW Discharge Note   Patient Details  Name: Nizar Cutler MRN: [id number] Date of Birth: 06/21/1961  Transition of Care [facility]) CM/SW Contact:  Su Hilt, RN Phone Number: 03/04/2019, 1:48 PM   Clinical Narrative:    Patient to Discharge to [facility] room 30 B Attempted to Notify the brother [place], The number is not in service,  The patient Is aware, The patient was at [facility] car prior to this admission and is going back. I explained to the patient that is he wanted to go somewhere closer to his home to a different long term care facility he could work with the facility and possibly have them find a long term facility closer to home, he agreed. The Discharge packet it on the chart along with signed scripts.  The nurse can call EMS when ready and call report to [facility] care [phone]   Final next level of care: Skilled Nursing Facility Barriers to Discharge: Barriers Resolved   Patient Goals and CMS Choice Patient states their goals for this hospitalization and ongoing recovery are:: get better CMS Medicare.gov Compare Post Acute Care list provided to:: Patient Choice offered to / list presented to : Patient  Discharge Placement                       Discharge Plan and Services   Discharge Planning Services: CM Consult Post Acute Care Choice: [place]                               Social Determinants of Health (SDOH) Interventions     Readmission Risk Interventions No flowsheet data found.

## 2019-03-04 NOTE — Plan of Care (Signed)
Pt is d/ced back to [place].  Called report to [facility] @ [phone].  Removed PICC line - it was intact w/42 cm.  Pt didn't want it removed but told him he'd be on PO abx.  Had a large BM.  He's very itchy pretty much all over.  He's got a Stage 2 on his sacrum - Pink foam is in place.  Foley.  S/P thoracic surgery at [facility].  Possible MRSA at multiple sites.  EMS called for transport.

## 2019-03-04 NOTE — Progress Notes (Signed)
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 Patient has a complicated infectious disease history mostly centered around MRSA.  In February 2020 [facility] [place] [name]with recurrent MRSA bacteremia, chronic osteo of foot and recurrent wound infection Blood cultures from 10/30/2018, 11/06/2018, 11/08/2018, 11/10/2018, 11/12/2018, were all positive for MRSA. Blood culture on 11/14/2018 was negative. He was treated with IV vancomycin from date of negative culture for 2 weeks.2/19TEE was negative  He was readmitted between 12/09/18-01/08/19 For MRSA bacteremia and MRI showed T8-T9 discitis/osteo- He underwent laminectomy on 12/22/18 and left BKA. OR culture had enterococcus . He was treated with 6 weeks of Iv vancomycin to finish on 01/30/19.  Presented to [name] on 02/06/19 from [place] care of abnormal creatinine-of 2.75 The case was discussed with [facility] physician on call and it was decided to stop antibiotics as he had completed therapy. T8-9 discitis osteomyelitis. 50% loss of height of T8 vertebral body and deformity of T9 superior endplate with 32-67% loss of vertebral body height. 2. Focal kyphosis and 7 mm bony retropulsion of T8 vertebral body impinge the spinal cord. 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 3. Dorsal epidural abscess extends superiorly to T5 level. Small ventral epidural abscess may be present posterior to T9 vertebral body. 4. Dorsal epidural abscess appears to communicate with a collection in the left T8-9 facet and multiloculated collection within dorsal soft tissues. 5. Small anterior paraspinal collections at the T8-9 disc level measuring 18 mm on  right and 10 mm on the left. He was transferred to [name] and underwent T8-T9 corpectomies with arthrodesis, hardware placed on 02/12/19--.OP cultures <1+ MRSA.Blood culture was neg and TTE was negative.was given daptomycin + rifampin and was transferred back to [name] to complete IV until 03/25/19. Vanco MIC was 2 for MRSA I am asked to see the patient for the same  [place] [facility] BKA in 2007 In 2015 he had right BKA stump infection with MRSA and was treated. In October 2018 he had MRSA of the left foot which was surgically debrided. And in November 2018 he again had MRSA and Proteus infection of the left foot and had debridement and was treated with IV antibiotics including vancomycin and cefepime.  12/22/2018 had left BKA.  In May 2019 he had left septic knee arthritis and had MRSA which has had a surgical washout and treated with 4 weeks of vancomycin.   Vitals Patient Vitals for the past 24 hrs:  BP Temp Temp src Pulse Resp SpO2  03/04/19 0810 122/71 98.6 F (37 C) Oral 63 18 99 %  03/03/19 2332 (!) 103/53 99.1 F (37.3 C) Oral 69 18 98 %   CBC Latest Ref Rng & Units 03/04/2019 03/03/2019 02/28/2019  WBC 4.0 - 10.5 K/uL 4.6 4.5 -  Hemoglobin 13.0 - 17.0 g/dL 7.6(L) 8.1(L) 7.4(L)  Hematocrit 39.0 - 52.0 % 23.3(L) 25.5(L) -  Platelets 150 - 400 K/uL 273 294 -   CMP Latest Ref Rng & Units 03/04/2019 02/28/2019 02/27/2019  Glucose 70 - 99 mg/dL 176(H) 212(H) 162(H)  BUN 6 - 20 mg/dL 23(H) 19 30(H)  Creatinine 0.61 - 1.24 mg/dL 0.91 0.77 1.04  Sodium 135 - 145 mmol/L 134(L) 133(L) 132(L)  Potassium 3.5 - 5.1 mmol/L 5.1 4.4 5.1  Chloride  98 - 111 mmol/L 104 103 102  CO2 22 - 32 mmol/L 23 21(L) 22  Calcium 8.9 - 10.3 mg/dL 8.3(L) 8.1(L) 8.3(L)  Total Protein 6.5 - 8.1 g/dL - - -  Total Bilirubin 0.3 - 1.2 mg/dL - - -  Alkaline Phos 38 - 126 U/L - - -  AST 15 - 41 U/L - - -  ALT 0 - 44 U/L - - -   57 y.o.malewithsubstance use,history ofbilateral BKA multiple MRSA infections in the past  including osteomyelitis but more recently with 2 prolonged MRSA bacteremia and spinal osteomyelitis with epidural abscess is [name]. Patient has a complicated infectious disease history mostly centered around MRSA.  Recurrent MRSA infection. 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 ORcultures had less than 1+ MRSA. ?Patient was transferred from [facility] on pravastatin, daptomycin and rifampin. His CPK was more than 5000. On June 2 it was around 500. So stopped both daptomycin and pravastatin. started him on linezolid 600 mg twice daily and continued rifampin.  CPK has  normalized . The plan was to restart daptomycin but concern for rhando and also SNF will not take him- As he is doing well on Linezolid 617m PO BID and po rifampin 3063mBID will continue to complete 6 weeks of treatment- following which he will be on Doxy 10036mID indefinitely for suppressive therapy   while on linezolid avoid any drugs or food that can precipitate Serotonin syndrome ( avoid SSRI, SNRI, pseuoephedrine products, amphetamine products to name a few. Also avoid fermented foods including sauerkraut, hard cheese, excess coffee and red wine) While on Rifampin watch closely for drug-drug interactions He needs weekly labs CBC with diff/CMP/ESR Fax results to Dr.Owynn Mosqueda at [phone] up appt in 3 weeks [id number] of chronic disease  ? 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

## 2019-03-04 NOTE — Discharge Summary (Addendum)
Eugene [name], is a 58 y.o. male  DOB 1961/03/08  MRN [id number].  Admission date:  02/26/2019  Admitting Physician  Campbell Stall, MD  Discharge Date:  03/04/2019   Primary MD  Dimple Casey, MD  Recommendations for primary care physician for things to follow:   Follow-up with PCP in 1 week Follow-up with Dr. Joylene Draft in 3 weeks  Admission Diagnosis  OSTEOMYLITIS   Discharge Diagnosis  OSTEOMYLITIS    Active Problems:   Osteomyelitis of thoracic spine [facility])      Past Medical History:  Diagnosis Date  . Diabetes mellitus without complication (HCC)   . Osteomyelitis [facility])     Past Surgical History:  Procedure Laterality Date  . bilateral amputation Bilateral   . CENTRAL LINE INSERTION-TUNNELED N/A 02/02/2019   Procedure: CENTRAL LINE INSERTION-TUNNELED;  Surgeon: Annice Needy, MD;  Location: [facility] INVASIVE CV LAB;  Service: Cardiovascular;  Laterality: N/A;  . THORACIC SPINE SURGERY  01/2019   extensive washout       History of present illness and  Hospital Course:     Kindly see H&P for history of present illness and admission details, please review complete Labs, Consult reports and Test reports for all details in brief  HPI  from the history and physical done on the day of admission 58 year old male with history of diabetes mellitus type 2, MRSA bacteremia, substance abuse, bilateral BKA, hepatitis C, T8-T9 osteomyelitis epidural abscess status post T7-T9 laminectomy on March 30 transferred from [facility] for evaluation of long-term IV antibiotics.   Hospital Course  MRSA osteomyelitis status post T8-T9 corpectomy with arthrodesis, hardware placement on May 21, however cultures had less than 1+ MRSA transfer from [facility] on daptomycin, pravastatin, rifampin, CPK was more than 5000, daptomycin was stopped by ID  physician Dr. Rudene Anda as it can cause rhabdomyolysis, after stopping daptomycin patient CK levels came down, decreased to 245 today. Patient is on rifampin 300 mg p.o. twice daily, Zyvox 600 mg p.o. twice daily till July 2 as per ID instructions after that patient is to be on doxycycline 100 mg p.o. twice daily indefinitely.  These are all listed in discharge instructions.  Patient needs to be on antibiotics with Zyvox, rifampin till July 2 after that patient is to be on doxycycline 100 mg p.o. twice daily.  #2/chronic pain syndrome, patient does have low back pain secondary to recent surgeries, was on methadone for a long time but nursing home cannot take the patient on methadone as per case manager so started on OxyContin, oxycodone. 3.  Severe constipation, use stool softeners as needed basis constipation likely secondary to narcotics. #4 peripheral neuropathy, continue Lyrica. 5.  Rhabdomyolysis secondary to daptomycin, improved after stopping daptomycin, statins. Essential hypertension: Controlled. 7.  Diabetes mellitus type 2, patient has proteinuria, retinopathy, continue Lantus along with sliding scale insulin with coverage.  Multiple MRSA infections, bilateral BKA, high risk for recurrence, prognosis poor.  Polysubstance abuse, denies IV drug abuse  Patient should not be on SSRIs, statins, and antidepressants. Discharge Condition: stable   Follow UP   Contact information for follow-up providers    Lynn Ito, MD. Schedule an appointment as soon as possible for a visit in 4 week(s).   Specialty:  Infectious Diseases Contact information: [address] [place] [address] [phone]            Contact information for after-discharge care    Destination    [facility] Preferred SNF .  Service:  Skilled Nursing Contact information: [address] [place] [address] [phone]                    Discharge  Instructions  and  Discharge Medications     Allergies as of 03/04/2019   No Known Allergies     Medication List    STOP taking these medications   daptomycin  IVPB Commonly known as:  CUBICIN   methadone 10 MG tablet Commonly known as:  DOLOPHINE   oxycodone 5 MG capsule Commonly known as:  OXY-IR Replaced by:  Oxycodone HCl 10 MG Tabs   pravastatin 40 MG tablet Commonly known as:  PRAVACHOL     TAKE these medications   acetaminophen 325 MG tablet Commonly known as:  TYLENOL Take 650 mg by mouth every 4 (four) hours as needed for mild pain or fever.   AMINO ACID PO Take 30 mLs by mouth.   amLODipine 10 MG tablet Commonly known as:  NORVASC Take 10 mg by mouth daily.   bisacodyl 10 MG suppository Commonly known as:  DULCOLAX Place 10 mg rectally daily.   bisacodyl 5 MG EC tablet Commonly known as:  DULCOLAX Take 10 mg by mouth daily.   diphenhydrAMINE 50 MG capsule Commonly known as:  BENADRYL Take 50 mg by mouth every 6 (six) hours as needed for itching.   docusate sodium 100 MG capsule Commonly known as:  COLACE Take 100 mg by mouth 2 (two) times daily.   doxycycline 50 MG capsule Commonly known as:  VIBRAMYCIN Take 2 capsules (100 mg total) by mouth 2 (two) times daily. Start July 3.  After finishing Zyvox, rifampin. Start taking on:  March 27, 2019   famotidine 20 MG tablet Commonly known as:  PEPCID Take 20 mg by mouth 2 (two) times daily.   ferrous gluconate 324 MG tablet Commonly known as:  FERGON Take 324 mg by mouth 2 (two) times daily with a meal.   furosemide 40 MG tablet Commonly known as:  LASIX Take 40 mg by mouth 2 (two) times daily.   heparin 5000 UNIT/ML injection Inject 5,000 Units into the skin every 12 (twelve) hours.   heparin flush 10 UNIT/ML Soln injection Inject 50 Units into the vein 2 (two) times daily. Also flush with 10ml of normal saline   hydrALAZINE 50 MG tablet Commonly known as:  APRESOLINE Take 50 mg by mouth  3 (three) times daily.   insulin aspart 100 UNIT/ML injection Commonly known as:  novoLOG Inject 0-15 Units into the skin 4 (four) times daily -  before meals and at bedtime.   insulin glargine 100 UNIT/ML injection Commonly known as:  LANTUS Inject 20 Units into the skin at bedtime.   insulin lispro 100 UNIT/ML injection Commonly known as:  HUMALOG Inject 0-15 Units into the skin 4 (four) times daily -  before meals and at bedtime. <150= 0 units 151-200= 3 units 201-250= 6 units 251-300= 9 units 301-350= 12 units 351-400= 15 units >400 call MD   Lactulose 20 GM/30ML Soln Take 30 g by mouth 2 (two) times daily.   linezolid 600 MG tablet Commonly known as:  Zyvox Take 1 tablet (600 mg total) by mouth 2 (two) times daily for 22 days.   Linzess 290 MCG Caps capsule Generic drug:  linaclotide Take 290 mcg by mouth daily before breakfast.   lisinopril 20 MG tablet Commonly known as:  ZESTRIL Take 20 mg by mouth daily.  magnesium hydroxide 400 MG/5ML suspension Commonly known as:  MILK OF MAGNESIA Take 30 mLs by mouth daily as needed for mild constipation.   methocarbamol 500 MG tablet Commonly known as:  ROBAXIN Take 1,000 mg by mouth 4 (four) times daily.   metoprolol succinate 25 MG 24 hr tablet Commonly known as:  TOPROL-XL Take 25 mg by mouth daily.   Oxycodone HCl 10 MG Tabs Take 1 tablet (10 mg total) by mouth every 4 (four) hours as needed for moderate pain or severe pain. Replaces:  oxycodone 5 MG capsule   oxyCODONE 10 mg 12 hr tablet Commonly known as:  OXYCONTIN Take 1 tablet (10 mg total) by mouth every 12 (twelve) hours.   polyethylene glycol 17 g packet Commonly known as:  MIRALAX / GLYCOLAX Take 17 g by mouth daily.   potassium chloride SA 20 MEQ tablet Commonly known as:  K-DUR Take 20 mEq by mouth daily.   pregabalin 75 MG capsule Commonly known as:  LYRICA Take 75 mg by mouth 2 (two) times daily.   rifampin 300 MG capsule Commonly  known as:  Rifadin Take 1 capsule (300 mg total) by mouth 2 (two) times daily for 22 days. What changed:    how much to take  when to take this   sodium phosphate Pediatric 3.5-9.5 GM/59ML enema Place 1 enema rectally every other day as needed for severe constipation.   traZODone 150 MG tablet Commonly known as:  DESYREL Take 150 mg by mouth at bedtime.         Diet and Activity recommendation: See Discharge Instructions above   Consults obtained -ID   Major procedures and Radiology Reports - PLEASE review detailed and final reports for all details, in brief -     Dg Orbits  Result Date: 02/09/2019 CLINICAL DATA:  Screening for MRI EXAM: ORBITS - COMPLETE 4+ VIEW COMPARISON:  None. FINDINGS: There is no evidence of metallic foreign body within the orbits. No significant bone abnormality identified. IMPRESSION: No evidence of metallic foreign body within the orbits. Electronically Signed   By: Charlett NoseKevin  Dover M.D.   On: 02/09/2019 15:18   Dg Chest 1 View  Result Date: 02/09/2019 CLINICAL DATA:  Unknown tetanus immunization status.  For  MRI. EXAM: CHEST  1 VIEW COMPARISON:  None. FINDINGS: Right jugular central venous catheter tip at the cavoatrial junction. No pneumothorax. Lungs are clear without infiltrate or effusion. No implanted device IMPRESSION: Central line at the cavoatrial junction.  No acute abnormality. Electronically Signed   By: Marlan Palauharles  Clark M.D.   On: 02/09/2019 15:28   Dg Pelvis 1-2 Views  Result Date: 02/09/2019 CLINICAL DATA:  Screening for MRI EXAM: PELVIS - 1-2 VIEW COMPARISON:  None. FINDINGS: No visible unexpected radiopaque foreign body. Hip joints and SI joints are symmetric. No acute bony abnormality. Specifically, no fracture, subluxation, or dislocation. IMPRESSION: No radiopaque foreign body.  No acute bony abnormality. Electronically Signed   By: Charlett NoseKevin  Dover M.D.   On: 02/09/2019 15:19   Dg Abd 1 View  Result Date: 02/09/2019 CLINICAL DATA:   Unknown tetanus immunization status EXAM: ABDOMEN - 1 VIEW COMPARISON:  None FINDINGS: Distended colon diffusely. Moderate amount of stool in the colon. No small bowel dilatation. No acute skeletal abnormality.  No implant or metallic foreign body. IMPRESSION: Colonic ileus. Electronically Signed   By: Marlan Palauharles  Clark M.D.   On: 02/09/2019 15:27   Mr Thoracic Spine Wo Contrast  Result Date: 02/09/2019 CLINICAL DATA:  58 y/o  M; T8-T9 vertebral osteomyelitis post laminectomy 12/22/2018 for follow-up. EXAM: MRI THORACIC SPINE WITHOUT CONTRAST TECHNIQUE: Multiplanar, multisequence MR imaging of the thoracic spine was performed. No intravenous contrast was administered. COMPARISON:  None. FINDINGS: Severely motion degraded study. Alignment: T8-9 grade 1 retrolisthesis and focal kyphosis due to partial collapse of the vertebral bodies and loss of the intervertebral disc space. Vertebrae: T8-9 discitis osteomyelitis. Diffuse low T1 signal and increased T2 signal throughout the T8 and T9 vertebral bodies extending into the transverse and posterior elements. There is approximately 50% loss of height of the T8 vertebral body and deformity of the superior endplate of the T9 vertebral body with 10-20% loss of height. Associated focal kyphosis and up to 7 mm of bony retropulsion of T8 impinge the spinal cord which demonstrates cord edema (series 23, image 10). There is a dorsal epidural abscess that extends superiorly to the T5 level. The epidural collection is in communication with a multiloculated collection within the dorsal subcutaneous fat and paraspinal muscles extending from T6-T11. The 2 collections appear to communicate through a fluid collection within the left T8-9 facet joint (series 23, image 10). There may be a small ventral epidural abscess posterior to the T9 vertebral body as well. 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  Paraspinal and other soft tissues: Extensive edema within the paravertebral soft tissues as well as paraspinal muscles and dorsal subcutaneous fat. Small anterior paraspinal collection are present at the T8-9 intervertebral disc level bilaterally measuring up to 18 mm in the right and 10 mm on the left (series 24, image 26). Disc levels: No significant spinal canal stenosis or foraminal stenosis at additional thoracic spinal levels. IMPRESSION: Severe motion degraded study. 1. T8-9 discitis osteomyelitis. 50% loss of height of T8 vertebral body and deformity of T9 superior endplate with 16-10% loss of vertebral body height. 2. Focal kyphosis and 7 mm bony retropulsion of T8 vertebral body impinge the spinal cord. 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 3. Dorsal epidural abscess extends superiorly to T5 level. Small ventral epidural abscess may be present posterior to T9 vertebral body. 4. Dorsal epidural abscess appears to communicate with a collection in the left T8-9 facet and multiloculated collection within dorsal soft tissues. 5. Small anterior paraspinal collections at the T8-9 disc level measuring 18 mm on right and 10 mm on the left. These results will be called to the ordering clinician or representative by the Radiologist Assistant, and communication documented in the PACS or zVision Dashboard. Electronically Signed   By: Kristine Garbe M.D.   On: 02/09/2019 19:51    Micro Results    Recent Results (from the past 240 hour(s))  Novel Coronavirus, NAA (hospital order; send-out to ref lab)     Status: None   Collection Time: 02/27/19 10:12 PM  Result Value Ref Range Status   SARS-CoV-2, NAA NOT DETECTED NOT DETECTED Final    Comment: (NOTE) Testing was performed using the cobas(R) SARS-CoV-2 test. This test was developed and its performance characteristics determined by [employer]. This test has not been FDA cleared or approved. This test has been  authorized by FDA under an Emergency Use Authorization (EUA). This test is only authorized for the duration of time the declaration that circumstances exist justifying the authorization of the emergency use of in vitro diagnostic tests for detection of SARS-CoV-2 virus and/or diagnosis of COVID-19 infection under section 564(b)(1) of the Act, 21  U.S.C. 360bbb-3(b)(1), unless the authorization is terminated or revoked sooner. When diagnostic testing is negative, the possibility of a false negative result should be considered in the context of a patient's recent exposures and the presence of clinical signs and symptoms consistent with COVID-19. An individual without symptoms of COVID-19 and who is not shedding SARS-CoV-2 virus would expect to have  a negative (not detected) result in this assay. Performed At: [facility] [address] [place], [place] [id number] Jolene SchimkeNagendra Sanjai MD WU:[phone]    Coronavirus Source NASOPHARYNGEAL  Final    Comment: Performed at [facility], [address] [place], [place] [address]  Novel Coronavirus, NAA (hospital order; send-out to ref lab)     Status: None   Collection Time: 03/02/19  4:11 PM  Result Value Ref Range Status   SARS-CoV-2, NAA NOT DETECTED NOT DETECTED Final    Comment: (NOTE) This test was developed and its performance characteristics determined by [facility]. This test has not been FDA cleared or approved. This test has been authorized by FDA under an Emergency Use Authorization (EUA). This test is only authorized for the duration of time the declaration that circumstances exist justifying the authorization of the emergency use of in vitro diagnostic tests for detection of SARS-CoV-2 virus and/or diagnosis of COVID-19 infection under section 564(b)(1) of the Act, 21 U.S.C. 308MVH-8(I)(6360bbb-3(b)(1), unless the authorization is terminated or revoked sooner. When diagnostic testing is negative, the possibility of  a false negative result should be considered in the context of a patient's recent exposures and the presence of clinical signs and symptoms consistent with COVID-19. An individual without symptoms of COVID-19 and who is not shedding SARS-CoV-2 virus would expect to have a negative (not detected) result in this assay. Performed  At: [facility]  [address] [place], [place] [id number] Jolene SchimkeNagendra Sanjai MD LK:[phone]    Coronavirus Source NASOPHARYNGEAL  Final    Comment: Performed at [facility], [address] [place], [place] [address]       Today   Subjective:   Eugene StarrDaniel [name] today chronic back pain, no fever stable for discharge today. Objective:   Blood pressure 122/71, pulse 63, temperature 98.6 F (37 C), temperature source Oral, resp. rate 18, height 6' (1.829 m), weight 106 kg, SpO2 99 %.   Intake/Output Summary (Last 24 hours) at 03/04/2019 1226 Last data filed at 03/04/2019 0400 Gross per 24 hour  Intake 452.22 ml  Output 2800 ml  Net -2347.78 ml    Exam Awake Alert, Oriented x 3, No new F.N deficits, Normal affect [place].AT,PERRAL Supple Neck,No JVD, No cervical lymphadenopathy appriciated.  Symmetrical Chest wall movement, Good air movement bilaterally, CTAB RRR,No Gallops,Rubs or new Murmurs, No Parasternal Heave +ve B.Sounds, Abd Soft, Non tender, No organomegaly appriciated, No rebound -guarding or rigidity. No Cyanosis, Clubbing or edema, No new Rash or bruise  Data Review   CBC w Diff:  Lab Results  Component Value Date   WBC 4.6 03/04/2019   HGB 7.6 (L) 03/04/2019   HCT 23.3 (L) 03/04/2019   PLT 273 03/04/2019   LYMPHOPCT 31 02/26/2019   MONOPCT 9 02/26/2019   EOSPCT 4 02/26/2019   BASOPCT 1 02/26/2019    CMP:  Lab Results  Component Value Date   NA 134 (L) 03/04/2019   K 5.1 03/04/2019   CL 104 03/04/2019   CO2 23 03/04/2019   BUN 23 (H) 03/04/2019   CREATININE 0.91 03/04/2019   PROT 7.2 02/26/2019   ALBUMIN  2.6 (L) 02/26/2019   BILITOT 0.4 02/26/2019   ALKPHOS 135 (H) 02/26/2019   AST 109 (H) 02/26/2019   ALT 49 (H) 02/26/2019  .   Total Time in preparing paper work, data evaluation and todays exam - 35 minutes  Katha HammingSnehalatha Cuyler Vandyken M.D on 03/04/2019 at 12:26 PM    Note: This dictation was prepared with Dragon dictation along with smaller phrase technology. Any transcriptional errors that result from this process are unintentional.

## 2019-03-04 NOTE — TOC Progression Note (Signed)
Transition of Care [facility] - Progression Note    Patient Details  Name: Eugene [name] MRN: [id number] Date of Birth: 11/14/1960  Transition of Care [facility]) CM/SW Contact  Su Hilt, RN Phone Number: 03/04/2019, 10:15 AM  Clinical Narrative:     Fay Records at [facility] a message asking if they can accept the patient taking Zyvox 600 mg BID, awaiting an answer, his Covid test results are back and they would be able to accept the patient today  Expected Discharge Plan: [place] Barriers to Discharge: Continued Medical Work up  Expected Discharge Plan and Services Expected Discharge Plan: [place]   Discharge Planning Services: CM Consult Post Acute Care Choice: [place] Living arrangements for the past 2 months: [place]                                       Social Determinants of Health (SDOH) Interventions    Readmission Risk Interventions No flowsheet data found.

## 2019-03-17 ENCOUNTER — Inpatient Hospital Stay: Payer: Medicaid Other | Admitting: Infectious Diseases

## 2019-07-20 ENCOUNTER — Emergency Department
Admission: EM | Admit: 2019-07-20 | Discharge: 2019-07-20 | Disposition: A | Discharge status: Skilled Nursing Facility | Payer: Medicaid Other | Attending: Emergency Medicine | Admitting: Emergency Medicine

## 2019-07-20 ENCOUNTER — Emergency Department: Payer: Medicaid Other

## 2019-07-20 ENCOUNTER — Other Ambulatory Visit: Payer: Self-pay

## 2019-07-20 DIAGNOSIS — T839XXA Unspecified complication of genitourinary prosthetic device, implant and graft, initial encounter: Secondary | ICD-10-CM

## 2019-07-20 DIAGNOSIS — T83011A Breakdown (mechanical) of indwelling urethral catheter, initial encounter: Secondary | ICD-10-CM | POA: Diagnosis not present

## 2019-07-20 DIAGNOSIS — E119 Type 2 diabetes mellitus without complications: Secondary | ICD-10-CM | POA: Diagnosis not present

## 2019-07-20 DIAGNOSIS — I10 Essential (primary) hypertension: Secondary | ICD-10-CM | POA: Insufficient documentation

## 2019-07-20 DIAGNOSIS — Y69 Unspecified misadventure during surgical and medical care: Secondary | ICD-10-CM | POA: Diagnosis not present

## 2019-07-20 DIAGNOSIS — Z794 Long term (current) use of insulin: Secondary | ICD-10-CM | POA: Insufficient documentation

## 2019-07-20 DIAGNOSIS — F17210 Nicotine dependence, cigarettes, uncomplicated: Secondary | ICD-10-CM | POA: Insufficient documentation

## 2019-07-20 DIAGNOSIS — Z79899 Other long term (current) drug therapy: Secondary | ICD-10-CM | POA: Insufficient documentation

## 2019-07-20 HISTORY — DX: Spinal stenosis, site unspecified: M48.00

## 2019-07-20 HISTORY — DX: Hyperlipidemia, unspecified: E78.5

## 2019-07-20 HISTORY — DX: Depression, unspecified: F32.A

## 2019-07-20 HISTORY — DX: Anxiety disorder, unspecified: F41.9

## 2019-07-20 HISTORY — DX: Unspecified viral hepatitis C without hepatic coma: B19.20

## 2019-07-20 HISTORY — DX: Irritable bowel syndrome, unspecified: K58.9

## 2019-07-20 NOTE — Discharge Instructions (Addendum)
Return as needed.  X-ray did not show anything in the bladder.  Please ask your primary care doctor to refer you to urology for suprapubic catheter placement.  This should keep your penis from getting any more damage from the catheter.

## 2019-07-20 NOTE — ED Provider Notes (Signed)
[facility] Emergency Department Provider Note   ____________________________________________   First MD Initiated Contact with Patient 07/20/19 1849     (approximate)  I have reviewed the triage vital signs and the nursing notes.   HISTORY  Chief Complaint Groin Injury    HPI Eugene [name] is a 58 y.o. male who reports his Foley catheter broke yesterday.  I took it out about 10:00.  He had to do something to deflate the balloon but then were able to remove it but could not get another 1 to go in.  He said his penis is a little sore.  Someone told him he was to get some x-rays to see if anything is broken.        Past Medical History:  Diagnosis Date  . Anxiety   . Depression   . Diabetes mellitus without complication (HCC)   . Hepatitis C   . Hyperlipidemia   . IBS (irritable bowel syndrome)   . Osteomyelitis (HCC)   . Spinal stenosis     Patient Active Problem List   Diagnosis Date Noted  . MRSA bacteremia 02/08/2019  . Osteomyelitis of thoracic spine (HCC) 02/08/2019  . Peripheral vascular disease (HCC) 02/08/2019  . Hypertension 02/08/2019  . Dyslipidemia 02/08/2019  . Chronic pain syndrome 02/08/2019  . Polysubstance abuse (HCC) 02/08/2019  . Chronic hepatitis C without hepatic coma (HCC) 02/08/2019  . GERD (gastroesophageal reflux disease) 02/08/2019  . Cigarette smoker 02/08/2019  . Normocytic anemia 02/08/2019  . UTI (urinary tract infection) 02/07/2019  . AKI (acute kidney injury) (HCC) 02/07/2019  . Hyperkalemia 02/07/2019  . Diabetes (HCC) 02/07/2019  . Pressure injury of skin 02/07/2019    Past Surgical History:  Procedure Laterality Date  . bilateral amputation Bilateral   . CENTRAL LINE INSERTION-TUNNELED N/A 02/02/2019   Procedure: CENTRAL LINE INSERTION-TUNNELED;  Surgeon: Annice Needyew, Jason S, MD;  Location: [facility] INVASIVE CV LAB;  Service: Cardiovascular;  Laterality: N/A;  . SPINAL FUSION    . THORACIC SPINE SURGERY   01/2019   extensive washout    Prior to Admission medications   Medication Sig Start Date End Date Taking? Authorizing Provider  acetaminophen (TYLENOL) 325 MG tablet Take 650 mg by mouth every 4 (four) hours as needed for mild pain or fever.    [provider]  Amino Acids (AMINO ACID PO) Take 30 mLs by mouth.    [provider]  amLODipine (NORVASC) 10 MG tablet Take 10 mg by mouth daily.    [provider]  bisacodyl (DULCOLAX) 10 MG suppository Place 10 mg rectally daily.     [provider]  bisacodyl (DULCOLAX) 5 MG EC tablet Take 10 mg by mouth daily.    [provider]  diphenhydrAMINE (BENADRYL) 50 MG capsule Take 50 mg by mouth every 6 (six) hours as needed for itching.    [provider]  docusate sodium (COLACE) 100 MG capsule Take 100 mg by mouth 2 (two) times daily.    [provider]  doxycycline (VIBRAMYCIN) 50 MG capsule Take 2 capsules (100 mg total) by mouth 2 (two) times daily. Start July 3.  After finishing Zyvox, rifampin. 03/27/19   Katha HammingKonidena, Snehalatha, MD  famotidine (PEPCID) 20 MG tablet Take 20 mg by mouth 2 (two) times daily.    [provider]  ferrous gluconate (FERGON) 324 MG tablet Take 324 mg by mouth 2 (two) times daily with a meal.     [provider]  furosemide (LASIX)  40 MG tablet Take 40 mg by mouth 2 (two) times daily.    [provider]  heparin 5000 UNIT/ML injection Inject 5,000 Units into the skin every 12 (twelve) hours.     [provider]  heparin flush 10 UNIT/ML SOLN injection Inject 50 Units into the vein 2 (two) times daily. Also flush with 49ml of normal saline    [provider]  hydrALAZINE (APRESOLINE) 50 MG tablet Take 50 mg by mouth 3 (three) times daily.    [provider]  insulin aspart (NOVOLOG) 100 UNIT/ML injection Inject 0-15 Units into the skin 4 (four) times daily -  before meals and at bedtime. 03/04/19   Epifanio Lesches, MD  insulin glargine (LANTUS) 100 UNIT/ML injection Inject 20 Units into the skin at bedtime.     [provider]  insulin lispro (HUMALOG) 100 UNIT/ML injection Inject 0-15 Units into the skin 4 (four) times daily -  before meals and at bedtime. <150= 0 units 151-200= 3 units 201-250= 6 units 251-300= 9 units 301-350= 12 units 351-400= 15 units >400 call MD    [provider]  Lactulose 20 GM/30ML SOLN Take 30 g by mouth 2 (two) times daily.     [provider]  linaclotide (LINZESS) 290 MCG CAPS capsule Take 290 mcg by mouth daily before breakfast.    [provider]  lisinopril (ZESTRIL) 20 MG tablet Take 20 mg by mouth daily.    [provider]  magnesium hydroxide (MILK OF MAGNESIA) 400 MG/5ML suspension Take 30 mLs by mouth daily as needed for mild constipation. 03/04/19   Epifanio Lesches, MD  methocarbamol (ROBAXIN) 500 MG tablet Take 1,000 mg by mouth 4 (four) times daily.     [provider]  metoprolol succinate (TOPROL-XL) 25 MG 24 hr tablet Take 25 mg by mouth daily.    [provider]  oxyCODONE (OXYCONTIN) 10 mg 12 hr tablet Take 1 tablet (10 mg total) by mouth every 12 (twelve) hours. 03/04/19   Epifanio Lesches, MD  oxyCODONE 10 MG TABS Take 1 tablet (10 mg total) by mouth every 4 (four) hours as needed for moderate pain or severe pain. 03/04/19   Epifanio Lesches, MD  polyethylene glycol (MIRALAX / GLYCOLAX) 17 g packet Take 17 g by mouth daily.    [provider]  potassium chloride SA (K-DUR) 20 MEQ tablet Take 20 mEq by mouth daily.    [provider]  pregabalin (LYRICA) 75 MG capsule Take 75 mg by mouth 2 (two) times daily.    [provider]  sodium phosphate Pediatric (FLEET) 3.5-9.5 GM/59ML enema Place 1 enema rectally every other day as needed for severe constipation.    [provider]  traZODone (DESYREL) 150 MG tablet Take 150 mg by mouth at  bedtime.    [provider]    Allergies Patient has no known allergies.  No family history on file.  Social History Social History   Tobacco Use  . Smoking status: Current Every Day Smoker    Packs/day: 0.25    Types: Cigarettes  . Smokeless tobacco: Never Used  Substance Use Topics  . Alcohol use: Not Currently  . Drug use: Not Currently    Review of Systems  Constitutional: No fever/chills Eyes: No visual changes. ENT: No sore throat. Cardiovascular: Denies chest pain. Respiratory: Denies shortness of breath. Gastrointestinal: No abdominal pain.  No nausea, no vomiting.  No diarrhea.  No constipation. Genitourinary: Negative for dysuria. Musculoskeletal:  Negative for back pain. Skin: Negative for rash.   ____________________________________________   PHYSICAL EXAM:  VITAL SIGNS: ED Triage Vitals  Enc Vitals Group     BP 07/20/19 1821 118/70     Pulse Rate 07/20/19 1821 70     Resp 07/20/19 1821 20     Temp 07/20/19 1813 98.2 F (36.8 C)     Temp Source 07/20/19 1813 Oral     SpO2 07/20/19 1821 97 %     Weight 07/20/19 1816 230 lb (104.3 kg)     Height --      Head Circumference --      Peak Flow --      Pain Score 07/20/19 1815 4     Pain Loc --      Pain Edu? --      Excl. in GC? --     Constitutional: Alert and oriented. Well appearing and in no acute distress. Eyes: Conjunctivae are normal.  Head: Atraumatic. Nose: No congestion/rhinnorhea. Mouth/Throat: Mucous membranes are moist.  Oropharynx non-erythematous. Neck: No stridor. Cardiovascular: Normal rate, regular rhythm. Grossly normal heart sounds.  Good peripheral circulation. Respiratory: Normal respiratory effort.  No retractions. Lungs CTAB. Gastrointestinal: Soft and nontender. No distention. No abdominal bruits.  Genitourinary: Circumcised male with what appears to be a little bit of erosion on his penis giving him an injury to the urethra at the base of the glans.  Musculoskeletal: Bilateral lower leg amputations. Neurologic:  Normal speech and language. No gross focal neurologic deficits are appreciated.  Skin:  Skin is warm, dry and intact. No rash noted.   ____________________________________________   LABS (all labs ordered are listed, but only abnormal results are displayed)  Labs Reviewed - No data to display ____________________________________________  EKG   ____________________________________________  RADIOLOGY  ED MD interpretation:   Official radiology report(s): Dg Pelvis 1-2 Views  Result Date: 07/20/2019 CLINICAL DATA:  Broken Foley catheter, assess for retained foreign body EXAM: PELVIS - 1-2 VIEW COMPARISON:  02/09/2019 FINDINGS: Bones are osteopenic. Degenerative changes of both hips. No acute osseous finding, malalignment or fracture. No diastasis. No radiopaque foreign body visualized by plain radiography. Vascular calcifications noted from atherosclerosis. IMPRESSION: No radiopaque foreign body by plain radiography. If there is continued concern for nonmetallic retained foreign body consider further evaluation with CT. Peripheral atherosclerosis Osteopenia and degenerative changes Electronically Signed   By: Judie Petit.  Shick M.D.   On: 07/20/2019 19:37    ____________________________________________   PROCEDURES  Procedure(s) performed (including Critical Care):  Procedures   ____________________________________________   INITIAL IMPRESSION / ASSESSMENT AND PLAN / ED COURSE   Eugene [name] was evaluated in Emergency Department on 07/20/2019 for the symptoms described in the history of present illness. He was evaluated in the context of the global COVID-19 pandemic, which necessitated consideration that the patient might be at risk for infection with the SARS-CoV-2 virus that causes COVID-19. Institutional protocols and algorithms that pertain to the evaluation of patients at risk for COVID-19 are in a state of rapid  change based on information released by regulatory bodies including the CDC and federal and state organizations. These policies and algorithms were followed during the patient's care in the ED.      Nurse was able to replace Foley without difficulty.  Nothing in the bladder on the plain film.  We will let this gentleman go home.       ____________________________________________   FINAL CLINICAL IMPRESSION(S) / ED DIAGNOSES  Final diagnoses:  Foley catheter problem,  initial encounter [facility])     ED Discharge Orders    None       Note:  This document was prepared using Dragon voice recognition software and may include unintentional dictation errors.    Arnaldo Natal, MD 07/20/19 2052

## 2019-07-20 NOTE — ED Triage Notes (Signed)
PT to ED via ems from [facility]. PT is has a chronic foley and last night foley catheter broke. Upon trying to reinsert catheter today staff was unable. PT c/o penis being "sore".

## 2019-08-18 ENCOUNTER — Encounter: Payer: Self-pay | Admitting: Urology

## 2019-08-18 ENCOUNTER — Ambulatory Visit (INDEPENDENT_AMBULATORY_CARE_PROVIDER_SITE_OTHER): Payer: Medicaid Other | Admitting: Urology

## 2019-08-18 VITALS — BP 133/75 | HR 91 | Wt 240.0 lb

## 2019-08-18 DIAGNOSIS — N319 Neuromuscular dysfunction of bladder, unspecified: Secondary | ICD-10-CM

## 2019-08-18 NOTE — Progress Notes (Signed)
08/18/19 2:35 PM   Eugene [name] 12/25/1960 [id number]  CC: Neurogenic bladder, urethral erosion  HPI: I saw Eugene [name] in urology clinic today for neurogenic bladder and urethral erosion.  He is a very comorbid and complex 58 year old male who is resided in a nursing home for the last 6 to 8 months.  He is a very poor historian, and history is obtained primarily from the chart.  He has poorly controlled diabetes and has bilateral BKA's, multiple spinal surgeries complicated by osteomyelitis/epidural abscesses.  He has been on long-term IV antibiotics for this.  Around 8 months ago he developed recurrent episodes of urinary retention and failed multiple voiding trials over the last few months when the Foley was removed.  He did not tolerate CIC well.  He has never undergone urodynamics or seen a urologist.  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  He is understandably very frustrated with his poor health and situation and urethral erosion.   PMH: Past Medical History:  Diagnosis Date  . Anxiety   . Depression   . Diabetes mellitus without complication ([place])   . Hepatitis C   . Hyperlipidemia   . IBS (irritable bowel syndrome)   . Osteomyelitis ([place])   . Spinal stenosis     Surgical History: Past Surgical History:  Procedure Laterality Date  . bilateral amputation Bilateral   . CENTRAL LINE INSERTION-TUNNELED N/A 02/02/2019   Procedure: CENTRAL LINE INSERTION-TUNNELED;  Surgeon: Algernon Huxley, MD;  Location: [facility] CV LAB;  Service: Cardiovascular;  Laterality: N/A;  . SPINAL FUSION    . THORACIC SPINE SURGERY  01/2019   extensive washout    Allergies: No Known Allergies  Family History: No family history on file.  Social History:  reports that he has been smoking cigarettes. He has been smoking about 0.25 packs per day. He has never used smokeless tobacco. He reports previous alcohol use. He reports previous drug use.  ROS: Please see  flowsheet from today's date for complete review of systems.  Physical Exam: BP 133/75   Pulse 91   Wt 240 lb (108.9 kg)   BMI 32.55 kg/m    Constitutional:  Alert and oriented, No acute distress. Cardiovascular: No clubbing, cyanosis, or edema. Respiratory: Normal respiratory effort, no increased work of breathing. GI: Abdomen is soft, nontender, nondistended, no abdominal masses Lymph: No cervical or inguinal lymphadenopathy. Psychiatric: Normal mood and affect.  Assessment & Plan:   In summary, the patient is a comorbid 58 year old male with poorly controlled diabetes, and multiple spinal surgeries, osteomyelitis, and epidural abscesses resulting in neurogenic bladder.  He is never undergone formal urodynamics, however he has failed multiple voiding trials with inability to void.  He did not tolerate clean intermittent catheterization in the past, and refuses this as an option for bladder management.  He also has severe urethral ventral erosion.  We had a long discussion about neurogenic bladder in the setting of his multiple comorbidities.  We also discussed the possibility of an atonic bladder in the setting of poorly controlled diabetes.  We discussed the role of urodynamics for more formal evaluation of bladder capacity and function.  He is very frustrated with his current situation.  We discussed bladder management options at length including clean intermittent catheterization, chronic Foley with monthly changes and strategies to reduce risk of worsening urethral erosion including keeping the catheter off tension and changing the leg side frequently, or suprapubic tube placement.  He would like to proceed  with suprapubic tube placement.  He will follow-up with me in in 3 to 4 months consideration of a clamping trial of the suprapubic tube to see if he is able to void spontaneously after treatment of his multiple spinal infections..  -Schedule suprapubic tube placement with  interventional radiology -Follow-up with me in 3 months for symptom check, consider formal urodynamics when spinal infection issues resolved v.  clamp trial of suprapubic tube to see if he is able to void spontaneously  A total of 45 minutes were spent face-to-face with the patient, greater than 50% was spent in patient education, counseling, and coordination of care regarding neurogenic bladder and bladder management options.   Sondra Come, MD  [facility] [address] [place], [place] [address] [phone]

## 2019-08-18 NOTE — Patient Instructions (Signed)
Suprapubic Catheter Home Guide °A suprapubic catheter is a flexible tube that is used to drain urine from the bladder into a collection bag outside the body. The catheter is inserted into the bladder through a small opening in the lower abdomen, above the pubic bone (suprapubic area) and a few inches below your belly button (navel). A tiny balloon filled with germ-free (sterile) water helps to keep the catheter in place. °The collection bag must be emptied at least once a day and cleaned at least every other day. The collection bag can be put beside your bed at night and attached to your leg during the day. You may have a large collection bag to use at night and a smaller one to use during the day. °Your suprapubic catheter may need to be changed every 4-6 weeks, or as often as recommended by your health care provider. Healing of the tract where the catheter is placed can take 6 weeks to 6 months. During that time, your health care provider may change your catheter. Once the tract is well healed, you or a caregiver will change your suprapubic catheter at home. °What are the risks? °This catheter is safe to use. However, problems can occur, including: °· Blocked urine flow. This can occur if the catheter stops working, or if you have a blood clot in your bladder or in the catheter. °· Irritation of the skin around the catheter. °· Infection. This can happen if bacteria gets into your bladder. °Supplies needed: °· Two pairs of sterile gloves. °· Paper towels. °· Catheter. °· Two syringes. °· Sterile water. °· Sterile cleaning solution. °· Lubricant. °· Collection bags. °How to change the catheter ° °1. Drink plenty of fluids during the hours before you change the catheter. °2. Wash your hands with soap and water. If soap and water are not available, use hand sanitizer. °3. Draw up sterile water into a syringe to have ready to fill the new catheter balloon. The amount will depend on the size of the balloon. °4. Have  all of your supplies ready and close to you on a paper towel. °5. Lie on your back, sitting slightly upright so that you can see the catheter and opening. °6. Put on sterile gloves. °7. Clean the skin around the catheter opening using the sterile cleaning solution. °8. Remove the water from the balloon in the catheter using a syringe. °9. Slowly remove the catheter. If the catheter seems stuck, or if you have difficulty removing it: °? Do not pull on it. °? Call your health care provider right away. °10. Place the old catheter on a paper towel to discard later. °11. Take off the used gloves, and put on a new pair. °12. Put lubricant on the end of the new catheter that will go into your bladder. °13. Clean the skin around the catheter opening using the sterile cleaning solution. °14. Gently slide the catheter through the opening in your abdomen and into the tract that leads to your bladder. °15. Wait for some urine to start flowing through the catheter. °16. When urine starts to flow through the catheter, attach the collection bag to the end of the catheter. Make sure the connection is tight. °17. Use a syringe to fill the catheter balloon with sterile water. Fill to the amount directed by your health care provider. °18. Remove the gloves and wash your hands with soap and water. °How to care for the skin around the catheter °Follow your health care provider's instructions on   caring for your skin. °· Use a clean washcloth and soapy water to clean the skin around your catheter every day. Pat the area dry with a clean paper towel. °· Do not pull on the catheter. °· Do not use ointment or lotion on this area, unless told by your health care provider. °· Check the skin around the catheter every day for signs of infection. Check for: °? Redness, swelling, or pain. °? Fluid or blood. °? Warmth. °? Pus or a bad smell. °How to empty and clean the collection bag °Empty the large collection bag every 8 hours. Empty the small  collection bag when it is about ? full. Clean the collection bag every 2-3 days, or as often as told by your health care provider. To do this: °1. Wash your hands with soap and water. If soap and water are not available, use hand sanitizer. °2. Disconnect the bag from the catheter and immediately attach a new bag to the catheter. °3. Hold the used bag over the toilet or another container. °4. Turn the valve (spigot) at the bottom of the bag to empty the urine. Empty the used bag completely. °? Do not touch the opening of the spigot. °? Do not let the opening touch the toilet or container. °5. Close the spigot tightly when the bag is empty. °6. Clean the used bag in one of the following methods: °? According to the manufacturer's instructions. °? As told by your health care provider. °7. Let the bag dry completely. Put it in a clean plastic bag before storing it. °General tips ° °· Always wash your hands before and after caring for your catheter and collection bag. Use a mild, fragrance-free soap. If soap and water are not available, use hand sanitizer. °· Clean the outside of the catheter with soap and water as often as told by your health care provider. °· Always make sure there are no twists or kinks in the catheter tube. °· Always make sure there are no leaks in the catheter or collection bag. °· Always wear the leg bag below your knee. °· Make sure the overnight drainage bag is always lower than the level of your bladder, but do not let it touch the floor. Before you go to sleep, hang the bag inside a wastebasket that is covered by a clean plastic bag. °· Drink enough fluid to keep your urine pale yellow. °· Do not take baths, swim, or use a hot tub until your health care provider approves. Ask your health care provider if you may take showers. °Contact a heath care provider if: °· You leak urine. °· You have redness, swelling, or pain around your catheter. °· You have fluid or blood coming from your catheter  opening. °· Your catheter opening feels warm to the touch. °· You have pus or a bad smell coming from your catheter opening. °· You have a fever or chills. °· Your urine flow slows down. °· Your urine becomes cloudy or smelly. °Get help right away if: °· Your catheter comes out. °· You have: °? Nausea. °? Back pain. °? Difficulty changing your catheter. °? Blood in your urine. °? No urine flow for 1 hour. °Summary °· A suprapubic catheter is a flexible tube that is used to drain urine from the bladder into a collection bag outside the body. °· Your suprapubic catheter may need to be changed every 4-6 weeks, or as recommended by your health care provider. °· Follow instructions on how to   change the catheter and how to empty and clean the collection bag. °· Always wash your hands before and after caring for your catheter and collection bag. Drink enough fluid to keep your urine pale yellow. °· Get help right away if you have difficulty changing your catheter or if there is blood in your urine. °This information is not intended to replace advice given to you by your health care provider. Make sure you discuss any questions you have with your health care provider. °Document Released: 05/29/2011 Document Revised: 01/01/2019 Document Reviewed: 10/15/2018 °Elsevier Patient Education © 2020 [employer] ° °

## 2019-08-19 ENCOUNTER — Other Ambulatory Visit: Payer: Self-pay | Admitting: Radiology

## 2019-08-19 DIAGNOSIS — T8389XS Other specified complication of genitourinary prosthetic devices, implants and grafts, sequela: Secondary | ICD-10-CM

## 2019-08-19 DIAGNOSIS — N319 Neuromuscular dysfunction of bladder, unspecified: Secondary | ICD-10-CM

## 2019-08-19 DIAGNOSIS — N368 Other specified disorders of urethra: Secondary | ICD-10-CM

## 2019-08-24 ENCOUNTER — Other Ambulatory Visit: Payer: Self-pay | Admitting: Radiology

## 2019-08-24 ENCOUNTER — Telehealth: Payer: Self-pay | Admitting: Radiology

## 2019-08-24 DIAGNOSIS — N319 Neuromuscular dysfunction of bladder, unspecified: Secondary | ICD-10-CM

## 2019-08-24 DIAGNOSIS — N368 Other specified disorders of urethra: Secondary | ICD-10-CM

## 2019-08-24 MED ORDER — SULFAMETHOXAZOLE-TRIMETHOPRIM 800-160 MG PO TABS: 1.0000 | ORAL_TABLET | Freq: Two times a day (BID) | ORAL | 0 refills | Status: AC

## 2019-08-24 NOTE — Telephone Encounter (Signed)
SPT placement appointment, order for Bactrim prior to procedure & follow up appointment called to [facility] at [facility] as well as faxed to facility.

## 2019-08-24 NOTE — Telephone Encounter (Signed)
-----   Message from Billey Co, MD sent at 08/18/2019  3:02 PM EST ----- Regarding: schedule SP tube with IR Please schedule SP tube with IR in the next few weeks, he has a foley now that can be removed after SP placement.  Vanco/Cipro pre-op, recommend 5 days Bactrim DS BID prior to placement to sterilize the urine  RTC with me 3 months post procedure for check up  Nickolas Madrid, MD 08/18/2019

## 2019-08-31 ENCOUNTER — Ambulatory Visit: Payer: Medicaid Other

## 2019-09-01 NOTE — Progress Notes (Signed)
Patient on schedule for SPT placement 09/04/2019, spoke to patient';s care nurse at [facility] today Eugene [name], with details given, ie NPO after Mn prior to procedure. 1/2 dose insulin pm prior to procedure as well as holding am dose of insulin day of procedure. States she will give info to patient prior to day of procedure.

## 2019-09-03 ENCOUNTER — Other Ambulatory Visit: Payer: Self-pay | Admitting: Physician Assistant

## 2019-09-04 ENCOUNTER — Ambulatory Visit
Admission: RE | Admit: 2019-09-04 | Discharge: 2019-09-04 | Disposition: A | Discharge status: Home / Self Care | Payer: Medicaid Other | Source: Ambulatory Visit | Attending: Urology | Admitting: Urology

## 2019-09-07 ENCOUNTER — Ambulatory Visit
Admission: RE | Admit: 2019-09-07 | Discharge: 2019-09-07 | Disposition: A | Discharge status: Home / Self Care | Payer: Medicaid Other | Source: Ambulatory Visit | Attending: Urology | Admitting: Urology

## 2019-09-07 ENCOUNTER — Other Ambulatory Visit: Payer: Self-pay

## 2019-09-07 DIAGNOSIS — N319 Neuromuscular dysfunction of bladder, unspecified: Secondary | ICD-10-CM | POA: Diagnosis not present

## 2019-09-07 DIAGNOSIS — T8389XS Other specified complication of genitourinary prosthetic devices, implants and grafts, sequela: Secondary | ICD-10-CM | POA: Insufficient documentation

## 2019-09-07 DIAGNOSIS — N368 Other specified disorders of urethra: Secondary | ICD-10-CM | POA: Diagnosis not present

## 2019-09-07 LAB — CBC
HCT: 36.4 % — ABNORMAL LOW (ref 39.0–52.0)
Hemoglobin: 11.8 g/dL — ABNORMAL LOW (ref 13.0–17.0)
MCH: 30.4 pg (ref 26.0–34.0)
MCHC: 32.4 g/dL (ref 30.0–36.0)
MCV: 93.8 fL (ref 80.0–100.0)
Platelets: 167 10*3/uL (ref 150–400)
RBC: 3.88 MIL/uL — ABNORMAL LOW (ref 4.22–5.81)
RDW: 13.9 % (ref 11.5–15.5)
WBC: 6.2 10*3/uL (ref 4.0–10.5)
nRBC: 0 % (ref 0.0–0.2)

## 2019-09-07 LAB — PROTIME-INR
INR: 1 (ref 0.8–1.2)
Prothrombin Time: 12.7 seconds (ref 11.4–15.2)

## 2019-09-07 LAB — GLUCOSE, CAPILLARY: Glucose-Capillary: 124 mg/dL — ABNORMAL HIGH (ref 70–99)

## 2019-09-07 MED ORDER — CIPROFLOXACIN IN D5W 400 MG/200ML IV SOLN
400.0000 mg | Freq: Once | INTRAVENOUS | Status: AC
  Filled 2019-09-07: qty 200

## 2019-09-07 MED ORDER — FENTANYL CITRATE (PF) 100 MCG/2ML IJ SOLN
INTRAMUSCULAR | Status: AC
  Filled 2019-09-07: qty 4

## 2019-09-07 MED ORDER — MIDAZOLAM HCL 5 MG/5ML IJ SOLN
INTRAMUSCULAR | Status: AC | PRN
  Administered 2019-09-07: 1 mg via INTRAVENOUS

## 2019-09-07 MED ORDER — SODIUM CHLORIDE 0.9 % IV SOLN
INTRAVENOUS | Status: DC
  Administered 2019-09-07: 12:00:00 20 mL/h via INTRAVENOUS

## 2019-09-07 MED ORDER — MIDAZOLAM HCL 5 MG/5ML IJ SOLN
INTRAMUSCULAR | Status: AC
  Filled 2019-09-07: qty 5

## 2019-09-07 MED ORDER — FENTANYL CITRATE (PF) 100 MCG/2ML IJ SOLN
INTRAMUSCULAR | Status: AC | PRN
  Administered 2019-09-07: 50 ug via INTRAVENOUS

## 2019-09-07 MED ORDER — CIPROFLOXACIN IN D5W 400 MG/200ML IV SOLN
INTRAVENOUS | Status: AC
  Administered 2019-09-07: 12:00:00 400 mg via INTRAVENOUS
  Filled 2019-09-07: qty 200

## 2019-09-07 NOTE — Procedures (Signed)
Interventional Radiology Procedure Note  Procedure: Suprapubic bladder catheter placement under CT guidance  Complications: None  Estimated Blood Loss: None  Findings: 12 Fr drain placed in bladder via midline suprapubic approach. Return of clear urine. Attached to gravity bag. Will remove Foley catheter during recovery.  Eugene [name]. Kathlene Cote, M.D Pager:  [phone]

## 2019-09-07 NOTE — Discharge Instructions (Signed)
Suprapubic Catheter Replacement ° °Suprapubic catheter replacement is a procedure to remove an old catheter and insert a new, clean catheter. A suprapubic catheter is a rubber tube that drains urine from the bladder into a collection bag outside the body. The catheter is inserted into the bladder through a small opening in the lower abdomen, near the center of the body, above the pubic bone (suprapubicarea). There is a tiny balloon filled with germ-free (sterile) water on the end of the catheter that is in the bladder. The balloon helps to keep the catheter in place. °If you need to wear a catheter for a long period of time, you may be instructed to replace the catheter yourself. Usually, suprapubic catheters need to be replaced every 4-6 weeks, or as often as told by your health care provider. °What are the risks? °Generally, this is a safe procedure. However, problems may occur, including failure to get the catheter into the bladder. °What happens before the procedure? °· You may have an exam or testing, including a blood or urine sample. °· Ask your health care provider what steps will be taken to help prevent infection. °What happens during the procedure? °· You will lie on your back. °· The water from the balloon will be removed using a syringe. °· The catheter will be slowly removed. °· Lubricant will be applied to the end of the new catheter that will go into your bladder. °· The new catheter will be inserted through the opening in your abdomen. Your health care provider will slide the catheter into your bladder. °· Your health care provider will wait for some urine to start flowing through the catheter. When this happens, a syringe will be used to fill the balloon with sterile water. °· A collection bag will be attached to the end of the catheter. °The procedure may vary among health care providers and hospitals. °What can I expect after procedure? °After the procedure, it is common to have: °· Some  discomfort around the opening in your abdomen. °Follow these instructions at home: °Caring for the skin around the catheter °Use a clean washcloth and soapy water to clean the skin around your catheter every day. Pat the area dry with a clean towel. °· Do not pull on the catheter. °· Do not use ointment or lotion on this area unless told by your health care provider. °· Check the skin around the catheter every day for signs of infection. Check for: °? Redness, swelling, or pain. °? Fluid or blood. °? Warmth. °? Pus or a bad smell. °Caring for the catheter °· Clean the catheter with soap and water as often as told by your health care provider. °· Always make sure there are no twists or curls (kinks) in the catheter. °· As soon as you are able to move, you may use a leg bag to collect the urine. °? Make sure that the tubing is straight and without kinks. °? Wrap an ace bandage gently over the tubing and around your leg to minimize the risk of the bag getting pulled out. °Emptying the collection bag °Empty the large collection bag every 8 hours. Empty the small collection bag when it is about ? full. To empty your large or small collection bag, take the following steps: °· Always keep the bag below the level of the catheter. This keeps urine from flowing backward into the catheter. °· Hold the bag over the toilet or another container. Turn the valve (spigot) at the bottom of   the bag to empty the urine. °? Do not touch the opening of the spigot. °? Do not let the opening touch the toilet or container. °· Close the spigot tightly when the bag is empty. °Cleaning the collection bag ° °· Wash your hands with soap and water. If soap and water are not available, use hand sanitizer. °· Disconnect the bag from the catheter and immediately attach a new bag to the catheter. °· Empty the used bag completely. °· Clean the used bag according to the manufacturer's instructions, or as told by your health care provider. °· Let the bag  dry completely, and put it in a clean plastic bag before storing it. °General instructions ° °· Always wash your hands before and after caring for your catheter and collection bag. Use soap and water. If soap and water are not available, use hand sanitizer. °· Always make sure there are no leaks in the catheter or collection bag. °· Drink enough fluid to keep your urine pale yellow. °· If you were prescribed an antibiotic medicine, take it as told by your health care provider. Do not stop taking the antibiotic even if you start to feel better. °· Do not take baths, swim, or use a hot tub. °· Keep all follow-up visits as told by your health care provider. This is important. °Contact a health care provider if: °· You leak urine. °· You have redness, swelling, or pain around your catheter opening. °· You have fluid or blood coming from your catheter opening. °· Your catheter opening feels warm to the touch. °· You have pus or a bad smell coming from your catheter opening. °· You have a fever or chills. °· Your urine flow slows down. °· Your urine becomes cloudy or smelly. °Get help right away if: °· Your catheter comes out. °· You feel nauseous. °· You have back pain. °· You have difficulty changing your catheter. °· You have blood in your urine. °· You have no urine flow for 1 hour. °Summary °· Suprapubic catheter replacement is a procedure to remove an old catheter and insert a new, clean catheter. °· Make sure that you understand how to care for your catheter, your collection bag, and the opening in your abdomen. °· Always wash your hands before and after caring for your catheter and collection bag. °· Contact a health care provider if you leak urine or have a fever or any signs of infection around the catheter opening, or if your urine becomes cloudy or smelly. °· Get help right away if your catheter comes out or you have nausea, back pain, blood in your urine, no urine flow for 1 hour, or difficulty changing your  catheter. °This information is not intended to replace advice given to you by your health care provider. Make sure you discuss any questions you have with your health care provider. °Document Released: 06/05/2001 Document Revised: 04/30/2018 Document Reviewed: 04/30/2018 °Elsevier Patient Education © 2020 [employer] °Moderate Conscious Sedation, Adult, Care After °These instructions provide you with information about caring for yourself after your procedure. Your health care provider may also give you more specific instructions. Your treatment has been planned according to current medical practices, but problems sometimes occur. Call your health care provider if you have any problems or questions after your procedure. °What can I expect after the procedure? °After your procedure, it is common: °· To feel sleepy for several hours. °· To feel clumsy and have poor balance for several hours. °· To   have poor judgment for several hours. °· To vomit if you eat too soon. °Follow these instructions at home: °For at least 24 hours after the procedure: ° °· Do not: °? Participate in activities where you could fall or become injured. °? Drive. °? Use heavy machinery. °? Drink alcohol. °? Take sleeping pills or medicines that cause drowsiness. °? Make important decisions or sign legal documents. °? Take care of children on your own. °· Rest. °Eating and drinking °· Follow the diet recommended by your health care provider. °· If you vomit: °? Drink water, juice, or soup when you can drink without vomiting. °? Make sure you have little or no nausea before eating solid foods. °General instructions °· Have a responsible adult stay with you until you are awake and alert. °· Take over-the-counter and prescription medicines only as told by your health care provider. °· If you smoke, do not smoke without supervision. °· Keep all follow-up visits as told by your health care provider. This is important. °Contact a health care provider  if: °· You keep feeling nauseous or you keep vomiting. °· You feel light-headed. °· You develop a rash. °· You have a fever. °Get help right away if: °· You have trouble breathing. °This information is not intended to replace advice given to you by your health care provider. Make sure you discuss any questions you have with your health care provider. °Document Released: 07/01/2013 Document Revised: 08/23/2017 Document Reviewed: 12/31/2015 °Elsevier Patient Education © 2020 [employer] ° °

## 2019-09-16 ENCOUNTER — Telehealth: Payer: Self-pay

## 2019-09-16 DIAGNOSIS — N368 Other specified disorders of urethra: Secondary | ICD-10-CM

## 2019-09-16 DIAGNOSIS — N319 Neuromuscular dysfunction of bladder, unspecified: Secondary | ICD-10-CM

## 2019-09-16 NOTE — Telephone Encounter (Signed)
Eugene [name] from patient's home and states that he rolled over and accidentally pulled out his SPT . He has voided 1300 this morning. Per [name], PAC verbal order was given to try and attempt foley replacement.  IR was contacted to schedule replacement but due to schedule and patient not being NPO the soonest apt is tomorrow 09-17-19@8 :30  Spoke with [name] again from home and she states she is unable to place foley and patient is refusing to let her try again. He agreeable to SPT replacement tomorrow morning. Spoke with transport at patient's home and will arrange for drop off tomorrow. Order was placed for IR SPT placement

## 2019-09-16 NOTE — Progress Notes (Signed)
Spoke with Danae Chen at [facility] and gave instructions to have this patient at [place] at 08:00 for pre procedure COVID testing.  Confirmed 08:00 arrival on 12/24 for testing at [place] then procedure to follow test results

## 2019-09-17 ENCOUNTER — Other Ambulatory Visit: Payer: Self-pay

## 2019-09-17 ENCOUNTER — Other Ambulatory Visit
Admission: RE | Admit: 2019-09-17 | Discharge: 2019-09-17 | Disposition: A | Discharge status: Home / Self Care | Payer: Medicaid Other | Source: Ambulatory Visit | Attending: Urology | Admitting: Urology

## 2019-09-17 ENCOUNTER — Ambulatory Visit
Admission: RE | Admit: 2019-09-17 | Discharge: 2019-09-17 | Disposition: A | Discharge status: Home / Self Care | Payer: Medicaid Other | Source: Ambulatory Visit | Attending: Urology | Admitting: Urology

## 2019-09-17 DIAGNOSIS — F419 Anxiety disorder, unspecified: Secondary | ICD-10-CM | POA: Insufficient documentation

## 2019-09-17 DIAGNOSIS — E119 Type 2 diabetes mellitus without complications: Secondary | ICD-10-CM | POA: Diagnosis not present

## 2019-09-17 DIAGNOSIS — U071 COVID-19: Secondary | ICD-10-CM | POA: Insufficient documentation

## 2019-09-17 DIAGNOSIS — Z899 Acquired absence of limb, unspecified: Secondary | ICD-10-CM | POA: Insufficient documentation

## 2019-09-17 DIAGNOSIS — Z79899 Other long term (current) drug therapy: Secondary | ICD-10-CM | POA: Diagnosis not present

## 2019-09-17 DIAGNOSIS — N319 Neuromuscular dysfunction of bladder, unspecified: Secondary | ICD-10-CM

## 2019-09-17 DIAGNOSIS — F329 Major depressive disorder, single episode, unspecified: Secondary | ICD-10-CM | POA: Diagnosis not present

## 2019-09-17 DIAGNOSIS — Z794 Long term (current) use of insulin: Secondary | ICD-10-CM | POA: Insufficient documentation

## 2019-09-17 DIAGNOSIS — N368 Other specified disorders of urethra: Secondary | ICD-10-CM | POA: Diagnosis not present

## 2019-09-17 DIAGNOSIS — F17210 Nicotine dependence, cigarettes, uncomplicated: Secondary | ICD-10-CM | POA: Diagnosis not present

## 2019-09-17 DIAGNOSIS — M4324 Fusion of spine, thoracic region: Secondary | ICD-10-CM | POA: Insufficient documentation

## 2019-09-17 DIAGNOSIS — T8389XS Other specified complication of genitourinary prosthetic devices, implants and grafts, sequela: Secondary | ICD-10-CM | POA: Diagnosis not present

## 2019-09-17 LAB — RESPIRATORY PANEL BY RT PCR (FLU A&B, COVID)
Influenza A by PCR: NEGATIVE
Influenza B by PCR: NEGATIVE
SARS Coronavirus 2 by RT PCR: POSITIVE — AB

## 2019-09-17 MED ORDER — MIDAZOLAM HCL 2 MG/2ML IJ SOLN
INTRAMUSCULAR | Status: AC | PRN
  Administered 2019-09-17 (×2): 1 mg via INTRAVENOUS

## 2019-09-17 MED ORDER — MIDAZOLAM HCL 5 MG/5ML IJ SOLN
INTRAMUSCULAR | Status: AC
  Filled 2019-09-17: qty 5

## 2019-09-17 MED ORDER — CIPROFLOXACIN IN D5W 400 MG/200ML IV SOLN
400.0000 mg | Freq: Two times a day (BID) | INTRAVENOUS | Status: DC
  Administered 2019-09-17: 400 mg via INTRAVENOUS

## 2019-09-17 MED ORDER — FENTANYL CITRATE (PF) 100 MCG/2ML IJ SOLN
INTRAMUSCULAR | Status: AC | PRN
  Administered 2019-09-17 (×2): 50 ug via INTRAVENOUS

## 2019-09-17 MED ORDER — SODIUM CHLORIDE 0.9 % IV SOLN: INTRAVENOUS | Status: DC

## 2019-09-17 MED ORDER — HYDROCODONE-ACETAMINOPHEN 5-325 MG PO TABS: 1.0000 | ORAL_TABLET | ORAL | Status: DC | PRN

## 2019-09-17 MED ORDER — CIPROFLOXACIN IN D5W 400 MG/200ML IV SOLN
INTRAVENOUS | Status: AC
  Filled 2019-09-17: qty 200

## 2019-09-17 MED ORDER — FENTANYL CITRATE (PF) 100 MCG/2ML IJ SOLN
INTRAMUSCULAR | Status: AC
  Filled 2019-09-17: qty 2

## 2019-09-17 NOTE — Discharge Instructions (Signed)
Suprapubic Catheter Home Guide °A suprapubic catheter is a flexible tube that is used to drain urine from the bladder into a collection bag outside the body. The catheter is inserted into the bladder through a small opening in the lower abdomen, above the pubic bone (suprapubic area) and a few inches below your belly button (navel). A tiny balloon filled with germ-free (sterile) water helps to keep the catheter in place. °The collection bag must be emptied at least once a day and cleaned at least every other day. The collection bag can be put beside your bed at night and attached to your leg during the day. You may have a large collection bag to use at night and a smaller one to use during the day. °Your suprapubic catheter may need to be changed every 4-6 weeks, or as often as recommended by your health care provider. Healing of the tract where the catheter is placed can take 6 weeks to 6 months. During that time, your health care provider may change your catheter. Once the tract is well healed, you or a caregiver will change your suprapubic catheter at home. °What are the risks? °This catheter is safe to use. However, problems can occur, including: °· Blocked urine flow. This can occur if the catheter stops working, or if you have a blood clot in your bladder or in the catheter. °· Irritation of the skin around the catheter. °· Infection. This can happen if bacteria gets into your bladder. °Supplies needed: °· Two pairs of sterile gloves. °· Paper towels. °· Catheter. °· Two syringes. °· Sterile water. °· Sterile cleaning solution. °· Lubricant. °· Collection bags. °How to change the catheter ° °1. Drink plenty of fluids during the hours before you change the catheter. °2. Wash your hands with soap and water. If soap and water are not available, use hand sanitizer. °3. Draw up sterile water into a syringe to have ready to fill the new catheter balloon. The amount will depend on the size of the balloon. °4. Have  all of your supplies ready and close to you on a paper towel. °5. Lie on your back, sitting slightly upright so that you can see the catheter and opening. °6. Put on sterile gloves. °7. Clean the skin around the catheter opening using the sterile cleaning solution. °8. Remove the water from the balloon in the catheter using a syringe. °9. Slowly remove the catheter. If the catheter seems stuck, or if you have difficulty removing it: °? Do not pull on it. °? Call your health care provider right away. °10. Place the old catheter on a paper towel to discard later. °11. Take off the used gloves, and put on a new pair. °12. Put lubricant on the end of the new catheter that will go into your bladder. °13. Clean the skin around the catheter opening using the sterile cleaning solution. °14. Gently slide the catheter through the opening in your abdomen and into the tract that leads to your bladder. °15. Wait for some urine to start flowing through the catheter. °16. When urine starts to flow through the catheter, attach the collection bag to the end of the catheter. Make sure the connection is tight. °17. Use a syringe to fill the catheter balloon with sterile water. Fill to the amount directed by your health care provider. °18. Remove the gloves and wash your hands with soap and water. °How to care for the skin around the catheter °Follow your health care provider's instructions on   caring for your skin. °· Use a clean washcloth and soapy water to clean the skin around your catheter every day. Pat the area dry with a clean paper towel. °· Do not pull on the catheter. °· Do not use ointment or lotion on this area, unless told by your health care provider. °· Check the skin around the catheter every day for signs of infection. Check for: °? Redness, swelling, or pain. °? Fluid or blood. °? Warmth. °? Pus or a bad smell. °How to empty and clean the collection bag °Empty the large collection bag every 8 hours. Empty the small  collection bag when it is about ? full. Clean the collection bag every 2-3 days, or as often as told by your health care provider. To do this: °1. Wash your hands with soap and water. If soap and water are not available, use hand sanitizer. °2. Disconnect the bag from the catheter and immediately attach a new bag to the catheter. °3. Hold the used bag over the toilet or another container. °4. Turn the valve (spigot) at the bottom of the bag to empty the urine. Empty the used bag completely. °? Do not touch the opening of the spigot. °? Do not let the opening touch the toilet or container. °5. Close the spigot tightly when the bag is empty. °6. Clean the used bag in one of the following methods: °? According to the manufacturer's instructions. °? As told by your health care provider. °7. Let the bag dry completely. Put it in a clean plastic bag before storing it. °General tips ° °· Always wash your hands before and after caring for your catheter and collection bag. Use a mild, fragrance-free soap. If soap and water are not available, use hand sanitizer. °· Clean the outside of the catheter with soap and water as often as told by your health care provider. °· Always make sure there are no twists or kinks in the catheter tube. °· Always make sure there are no leaks in the catheter or collection bag. °· Always wear the leg bag below your knee. °· Make sure the overnight drainage bag is always lower than the level of your bladder, but do not let it touch the floor. Before you go to sleep, hang the bag inside a wastebasket that is covered by a clean plastic bag. °· Drink enough fluid to keep your urine pale yellow. °· Do not take baths, swim, or use a hot tub until your health care provider approves. Ask your health care provider if you may take showers. °Contact a heath care provider if: °· You leak urine. °· You have redness, swelling, or pain around your catheter. °· You have fluid or blood coming from your catheter  opening. °· Your catheter opening feels warm to the touch. °· You have pus or a bad smell coming from your catheter opening. °· You have a fever or chills. °· Your urine flow slows down. °· Your urine becomes cloudy or smelly. °Get help right away if: °· Your catheter comes out. °· You have: °? Nausea. °? Back pain. °? Difficulty changing your catheter. °? Blood in your urine. °? No urine flow for 1 hour. °Summary °· A suprapubic catheter is a flexible tube that is used to drain urine from the bladder into a collection bag outside the body. °· Your suprapubic catheter may need to be changed every 4-6 weeks, or as recommended by your health care provider. °· Follow instructions on how to   change the catheter and how to empty and clean the collection bag. °· Always wash your hands before and after caring for your catheter and collection bag. Drink enough fluid to keep your urine pale yellow. °· Get help right away if you have difficulty changing your catheter or if there is blood in your urine. °This information is not intended to replace advice given to you by your health care provider. Make sure you discuss any questions you have with your health care provider. °Document Released: 05/29/2011 Document Revised: 01/01/2019 Document Reviewed: 10/15/2018 °Elsevier Patient Education © 2020 [employer]. ° °

## 2019-09-17 NOTE — Procedures (Signed)
Interventional Radiology Procedure:   Indications: Neurogenic bladder, urinary retention and dislodged suprapubic catheter.  Procedure: CT guided suprapubic catheter placement  Findings: Distended bladder and placed 12 Fr drain.  Removed 1700 ml of urine from bladder.  Complications: None     EBL: less than 10 ml  Plan: Complete IV ciprofloxacin and discharge in 2 hours in no issues.    Mikiya Nebergall R. Anselm Pancoast, MD  Pager: [phone]

## 2019-09-17 NOTE — Progress Notes (Signed)
Patient taking po's without difficulty, denies complaints at this time, awaiting discharge per non/emergent EMS transport. Nursing home notified earlier that patient;s personal wheelchair will be COVID cleaned, and they will be picking up on MOnday. Patient's vitals have remained stable, alert/awake and oriented.

## 2019-09-17 NOTE — Progress Notes (Signed)
Patient has remained clinically stable. Finished with dose of CIPRO iv per orders, without complaints, UOP total of 2liters post SPT placement per Dr Anselm Pancoast. Denies complaints at this time. Report called to [place] care nurse with questions answered. Plan reviewed.

## 2019-09-17 NOTE — Progress Notes (Signed)
Patient here today awaiting to get SPT insertion, since outbreak COVID cases at [facility], we are awaiting COVID test results per radiologist's request. [facility] was instructed yesterday to have this patient at [place] 0800 today to get test, however patient dropped off here in waiting room instead of test first. Patient taken to [place] personally by me to get testing, and is on stretcher in waiting room until results back prior to procedure. Patient aware of the wait and is resting at this time on stretcher with rails up.

## 2019-09-17 NOTE — H&P (Signed)
Chief Complaint: Patient was seen in consultation today for suprapubic catheter placement.  Referring Physician(s): McGowan,Shannon A  Patient Status: [facility] - Out-pt  History of Present Illness: Eugene [name] is a 58 y.o. male with neurogenic bladder, bilateral amputee and urinary retention.  Patient underwent a CT-guided suprapubic catheter placement on 09/07/2019.  Suprapubic catheter was dislodged and the patient is now in urinary retention.  Patient lives in a facility where there was a Covid outbreak.  Patient had a Covid test this morning that was positive.  Patient has a severe abdominal pain from the urinary retention.  He has no other complaints.  Specifically, he does not have respiratory complaints.  Past Medical History:  Diagnosis Date  . Anxiety   . Depression   . Diabetes mellitus without complication (HCC)   . Hepatitis C   . Hyperlipidemia   . IBS (irritable bowel syndrome)   . Osteomyelitis (HCC)   . Spinal stenosis     Past Surgical History:  Procedure Laterality Date  . bilateral amputation Bilateral   . CENTRAL LINE INSERTION-TUNNELED N/A 02/02/2019   Procedure: CENTRAL LINE INSERTION-TUNNELED;  Surgeon: Annice Needyew, Jason S, MD;  Location: [facility] INVASIVE CV LAB;  Service: Cardiovascular;  Laterality: N/A;  . SPINAL FUSION    . THORACIC SPINE SURGERY  01/2019   extensive washout    Allergies: Patient has no known allergies.  Medications: Prior to Admission medications   Medication Sig Start Date End Date Taking? Authorizing Provider  acetaminophen (TYLENOL) 325 MG tablet Take 650 mg by mouth every 4 (four) hours as needed for mild pain or fever.   Yes [provider]  Amino Acids (AMINO ACID PO) Take 30 mLs by mouth.   Yes [provider]  amLODipine (NORVASC) 10 MG tablet Take 10 mg by mouth daily.   Yes [provider]  bisacodyl (DULCOLAX) 10 MG suppository Place 10 mg rectally daily.    Yes [provider]  bisacodyl  (DULCOLAX) 5 MG EC tablet Take 10 mg by mouth daily.   Yes [provider]  diphenhydrAMINE (BENADRYL) 50 MG capsule Take 50 mg by mouth every 6 (six) hours as needed for itching.   Yes [provider]  docusate sodium (COLACE) 100 MG capsule Take 100 mg by mouth 2 (two) times daily.   Yes [provider]  famotidine (PEPCID) 20 MG tablet Take 20 mg by mouth 2 (two) times daily.   Yes [provider]  ferrous gluconate (FERGON) 324 MG tablet Take 324 mg by mouth 2 (two) times daily with a meal.    Yes [provider]  furosemide (LASIX) 40 MG tablet Take 40 mg by mouth 2 (two) times daily.   Yes [provider]  heparin 5000 UNIT/ML injection Inject 5,000 Units into the skin every 12 (twelve) hours.    Yes [provider]  heparin flush 10 UNIT/ML SOLN injection Inject 50 Units into the vein 2 (two) times daily. Also flush with 10ml of normal saline   Yes [provider]  hydrALAZINE (APRESOLINE) 50 MG tablet Take 50 mg by mouth 3 (three) times daily.   Yes [provider]  insulin aspart (NOVOLOG) 100 UNIT/ML injection Inject 0-15 Units into the skin 4 (four) times daily -  before meals and at bedtime. 03/04/19  Yes Katha HammingKonidena, Snehalatha, MD  insulin glargine (LANTUS) 100 UNIT/ML injection Inject 20 Units into the skin at bedtime.    Yes [provider]  insulin lispro (HUMALOG) 100  UNIT/ML injection Inject 0-15 Units into the skin 4 (four) times daily -  before meals and at bedtime. <150= 0 units 151-200= 3 units 201-250= 6 units 251-300= 9 units 301-350= 12 units 351-400= 15 units >400 call MD   Yes [provider]  Lactulose 20 GM/30ML SOLN Take 30 g by mouth 2 (two) times daily.    Yes [provider]  linaclotide (LINZESS) 290 MCG CAPS capsule Take 290 mcg by mouth daily before breakfast.   Yes [provider]  lisinopril (ZESTRIL) 20 MG tablet Take 20 mg by mouth daily.   Yes  [provider]  magnesium hydroxide (MILK OF MAGNESIA) 400 MG/5ML suspension Take 30 mLs by mouth daily as needed for mild constipation. 03/04/19  Yes Katha Hamming, MD  methocarbamol (ROBAXIN) 500 MG tablet Take 1,000 mg by mouth 4 (four) times daily.    Yes [provider]  metoprolol succinate (TOPROL-XL) 25 MG 24 hr tablet Take 25 mg by mouth daily.   Yes [provider]  oxyCODONE (OXYCONTIN) 10 mg 12 hr tablet Take 1 tablet (10 mg total) by mouth every 12 (twelve) hours. 03/04/19  Yes Katha Hamming, MD  polyethylene glycol (MIRALAX / GLYCOLAX) 17 g packet Take 17 g by mouth daily.   Yes [provider]  potassium chloride SA (K-DUR) 20 MEQ tablet Take 20 mEq by mouth daily.   Yes [provider]  pregabalin (LYRICA) 75 MG capsule Take 75 mg by mouth 2 (two) times daily.   Yes [provider]  sodium phosphate Pediatric (FLEET) 3.5-9.5 GM/59ML enema Place 1 enema rectally every other day as needed for severe constipation.   Yes [provider]  traZODone (DESYREL) 150 MG tablet Take 150 mg by mouth at bedtime.   Yes [provider]  oxyCODONE 10 MG TABS Take 1 tablet (10 mg total) by mouth every 4 (four) hours as needed for moderate pain or severe pain. 03/04/19   Katha Hamming, MD     No family history on file.  Social History   Socioeconomic History  . Marital status: Single    Spouse name: Not on file  . Number of children: Not on file  . Years of education: Not on file  . Highest education level: Not on file  Occupational History  . Not on file  Tobacco Use  . Smoking status: Current Every Day Smoker    Packs/day: 0.25    Types: Cigarettes  . Smokeless tobacco: Never Used  Substance and Sexual Activity  . Alcohol use: Not Currently  . Drug use: Not Currently  . Sexual activity: Not Currently  Other Topics Concern  . Not on file  Social History Narrative   Resides at [facility]   Social Determinants of Health   Financial Resource Strain: Medium Risk  . Difficulty of Paying Living Expenses: Somewhat hard  Food Insecurity: Food Insecurity Present  . Worried About Programme researcher, broadcasting/film/video in the Last Year: Sometimes true  . Ran Out of Food in the Last Year: Sometimes true  Transportation Needs: No Transportation Needs  . Lack of Transportation (Medical): No  . Lack of Transportation (Non-Medical): No  Physical Activity: Unknown  . Days of Exercise per Week: 0 days  . Minutes of Exercise per Session: Not on file  Stress: Stress Concern Present  . Feeling of Stress : To some extent  Social Connections: Somewhat Isolated  . Frequency of Communication with Friends and Family: More than three times  a week  . Frequency of Social Gatherings with Friends and Family: More than three times a week  . Attends Religious Services: More than 4 times per year  . Active Member of Clubs or Organizations: Not on file  . Attends Banker Meetings: Never  . Marital Status: Never married     Review of Systems  Gastrointestinal: Positive for abdominal distention and abdominal pain.  Genitourinary:       Urinary retention.    Vital Signs: BP (!) 166/94 (BP Location: Left Arm)   Pulse 73   Temp 98.2 F (36.8 C) (Oral)   Resp (!) 22   Ht 6' (1.829 m)   Wt 108 kg   SpO2 99%   BMI 32.29 kg/m   Physical Exam Constitutional:      General: He is in acute distress.  Cardiovascular:     Rate and Rhythm: Normal rate and regular rhythm.  Pulmonary:     Effort: Pulmonary effort is normal.     Breath sounds: Normal breath sounds.  Abdominal:     General: There is distension.     Tenderness: There is abdominal tenderness.     Imaging: CT IMAGE GUIDED DRAINAGE BY PERCUTANEOUS CATHETER  Result Date: 09/07/2019 INDICATION: Neurogenic bladder requiring long-term Foley catheter placement. The patient now presents for suprapubic bladder drainage catheter  placement. EXAM: CT IMAGE GUIDED DRAINAGE BY PERCUTANEOUS CATHETER COMPARISON:  None. MEDICATIONS: Ciprofloxacin 400 mg IV; The antibiotic was administered in an appropriate time frame prior to skin puncture. ANESTHESIA/SEDATION: Fentanyl 50 mcg IV; Versed 1.0 mg IV Moderate Sedation Time:  16 minutes. The patient was continuously monitored during the procedure by the interventional radiology nurse under my direct supervision. CONTRAST:  None FLUOROSCOPY TIME:  None COMPLICATIONS: None immediate. PROCEDURE: Informed written consent was obtained from the patient after a thorough discussion of the procedural risks, benefits and alternatives. All questions were addressed. Maximal Sterile Barrier Technique was utilized including caps, mask, sterile gowns, sterile gloves, sterile drape, hand hygiene and skin antiseptic. A timeout was performed prior to the initiation of the procedure. Imaging was performed through the pelvis in a supine position. Indwelling Foley catheter had been clamped prior to the procedure. Under CT guidance, an 18 gauge trocar needle was advanced into the bladder lumen. After return of urine, a guidewire was advanced through the needle and the needle removed. The percutaneous tract was dilated and a 12 French pigtail drainage catheter advanced into the bladder. Catheter position was confirmed by CT. The catheter was attached to a gravity drainage bag and secured at the skin with a Prolene retention suture and StatLock device. FINDINGS: The suprapubic catheter was successfully placed into the bladder lumen with return of clear urine. IMPRESSION: CT guided placement of a suprapubic catheter into the bladder lumen. A 12 French catheter was placed. This can be exchanged and upsized in 4-6 weeks. Electronically Signed   By: Irish Lack M.D.   On: 09/07/2019 14:24    Labs:  CBC: Recent Labs    02/26/19 0546 02/28/19 1040 03/03/19 0614 03/04/19 0618 09/07/19 1202  WBC 6.1  --  4.5 4.6 6.2   HGB 7.6* 7.4* 8.1* 7.6* 11.8*  HCT 23.9*  --  25.5* 23.3* 36.4*  PLT 298  --  294 273 167    COAGS: Recent Labs    02/26/19 0546 09/07/19 1202  INR 1.1 1.0  APTT 36  --     BMP: Recent Labs    02/26/19 0546 02/26/19  1200 02/27/19 0711 02/28/19 1040 03/04/19 0618  NA 132*  --  132* 133* 134*  K 5.6* 5.6* 5.1 4.4 5.1  CL 100  --  102 103 104  CO2 23  --  22 21* 23  GLUCOSE 189*  --  162* 212* 176*  BUN 38*  --  30* 19 23*  CALCIUM 8.3*  --  8.3* 8.1* 8.3*  CREATININE 1.23  --  1.04 0.77 0.91  GFRNONAA >60  --  >60 >60 >60  GFRAA >60  --  >60 >60 >60    LIVER FUNCTION TESTS: Recent Labs    02/06/19 2317 02/26/19 0546  BILITOT 0.4 0.4  AST 14* 109*  ALT 16 49*  ALKPHOS 56 135*  PROT 7.8 7.2  ALBUMIN 2.6* 2.6*    TUMOR MARKERS: No results for input(s): AFPTM, CEA, CA199, CHROMGRNA in the last 8760 hours.  Assessment and Plan:  58 year old with neurogenic bladder, urinary retention and recently dislodged superpubic catheter.  Patient has also been diagnosed with COVID-19 today.  Patient needs immediate placement of a new suprapubic catheter.  We will plan for placement of a CT-guided suprapubic catheter.  Risks and benefits discussed with the patient including bleeding, infection, damage to adjacent structures, bowel perforation/fistula connection, and sepsis.All of the patient's questions were answered, patient is agreeable to proceed.  Verbal consent was obtained due to the COVID-19.     Thank you for this interesting consult.  I greatly enjoyed meeting Kimothy Kishimoto and look forward to participating in their care.  A copy of this report was sent to the requesting provider on this date.  Electronically Signed: Burman Riis, MD 09/17/2019, 10:49 AM   I spent a total of   15 Minutes in face to face in clinical consultation, greater than 50% of which was counseling/coordinating care for suprapubic catheter placement.

## 2019-09-17 NOTE — Progress Notes (Signed)
Patient tested positive for COVID 19, resident at [facility] [name] , [place] made aware of the results and a copy of those faxed to [facility].

## 2019-09-17 NOTE — Progress Notes (Signed)
Patient clincally stable post SPT placement today, patient test COVID positve prior to procedure. Afebrile, has taste/smell. sats on RA 95-97, eating lunch. Vitals have remained stable pre and post procedure. Dr Anselm Pancoast out to speak with patient post procedure , receiving CIPRO 400mg  iv for procedure.

## 2019-09-17 NOTE — Progress Notes (Signed)
Patient resting at  This time,waiting for non emergent transport back to [facility], dozing at intervals.without complaints.

## 2019-09-21 ENCOUNTER — Telehealth: Payer: Self-pay | Admitting: Family Medicine

## 2019-09-21 LAB — GLUCOSE, CAPILLARY: Glucose-Capillary: 243 mg/dL — ABNORMAL HIGH (ref 70–99)

## 2019-09-21 NOTE — Telephone Encounter (Signed)
Spoke to [employer] and she is aware of the positive COVID result. She states he was positive a couple months ago and he is still within the 90 window for the test to show positive.

## 2019-09-21 NOTE — Telephone Encounter (Signed)
-----   Message from Nori Riis, PA-C sent at 09/21/2019  8:12 AM EST ----- Would you contact the patient and his group home and make sure they are aware of his positive COVID test and also make sure they have been contacted by the Health Department?

## 2019-09-22 ENCOUNTER — Telehealth: Payer: Self-pay | Admitting: Urology

## 2019-09-22 NOTE — Telephone Encounter (Signed)
Would you get him scheduled for his next SPT up sizing in 4 to 6 weeks?

## 2019-09-28 ENCOUNTER — Other Ambulatory Visit: Payer: Self-pay | Admitting: Radiology

## 2019-09-28 DIAGNOSIS — N368 Other specified disorders of urethra: Secondary | ICD-10-CM

## 2019-09-28 DIAGNOSIS — T8389XS Other specified complication of genitourinary prosthetic devices, implants and grafts, sequela: Secondary | ICD-10-CM

## 2019-09-28 DIAGNOSIS — N319 Neuromuscular dysfunction of bladder, unspecified: Secondary | ICD-10-CM

## 2019-09-28 NOTE — Telephone Encounter (Signed)
Patient will be contacted once scheduled.

## 2019-10-02 ENCOUNTER — Encounter: Payer: Self-pay | Admitting: Internal Medicine

## 2019-10-02 ENCOUNTER — Emergency Department: Payer: Medicaid Other

## 2019-10-02 ENCOUNTER — Inpatient Hospital Stay
Admission: EM | Admit: 2019-10-02 | Discharge: 2019-10-07 | DRG: 698 | Disposition: A | Discharge status: Skilled Nursing Facility | Payer: Medicaid Other | Attending: Internal Medicine | Admitting: Internal Medicine

## 2019-10-02 ENCOUNTER — Other Ambulatory Visit: Payer: Self-pay

## 2019-10-02 DIAGNOSIS — Z9359 Other cystostomy status: Secondary | ICD-10-CM

## 2019-10-02 DIAGNOSIS — R319 Hematuria, unspecified: Secondary | ICD-10-CM | POA: Diagnosis present

## 2019-10-02 DIAGNOSIS — E785 Hyperlipidemia, unspecified: Secondary | ICD-10-CM | POA: Diagnosis present

## 2019-10-02 DIAGNOSIS — E871 Hypo-osmolality and hyponatremia: Secondary | ICD-10-CM | POA: Diagnosis present

## 2019-10-02 DIAGNOSIS — N390 Urinary tract infection, site not specified: Secondary | ICD-10-CM | POA: Diagnosis present

## 2019-10-02 DIAGNOSIS — Z981 Arthrodesis status: Secondary | ICD-10-CM | POA: Diagnosis not present

## 2019-10-02 DIAGNOSIS — E1165 Type 2 diabetes mellitus with hyperglycemia: Secondary | ICD-10-CM | POA: Diagnosis present

## 2019-10-02 DIAGNOSIS — T83511S Infection and inflammatory reaction due to indwelling urethral catheter, sequela: Secondary | ICD-10-CM | POA: Diagnosis not present

## 2019-10-02 DIAGNOSIS — K219 Gastro-esophageal reflux disease without esophagitis: Secondary | ICD-10-CM | POA: Diagnosis present

## 2019-10-02 DIAGNOSIS — Z794 Long term (current) use of insulin: Secondary | ICD-10-CM

## 2019-10-02 DIAGNOSIS — F329 Major depressive disorder, single episode, unspecified: Secondary | ICD-10-CM | POA: Diagnosis present

## 2019-10-02 DIAGNOSIS — Z8616 Personal history of COVID-19: Secondary | ICD-10-CM

## 2019-10-02 DIAGNOSIS — Z6833 Body mass index (BMI) 33.0-33.9, adult: Secondary | ICD-10-CM

## 2019-10-02 DIAGNOSIS — T83021A Displacement of indwelling urethral catheter, initial encounter: Secondary | ICD-10-CM

## 2019-10-02 DIAGNOSIS — K589 Irritable bowel syndrome without diarrhea: Secondary | ICD-10-CM | POA: Diagnosis present

## 2019-10-02 DIAGNOSIS — F17210 Nicotine dependence, cigarettes, uncomplicated: Secondary | ICD-10-CM | POA: Diagnosis present

## 2019-10-02 DIAGNOSIS — T83511A Infection and inflammatory reaction due to indwelling urethral catheter, initial encounter: Principal | ICD-10-CM | POA: Diagnosis present

## 2019-10-02 DIAGNOSIS — E669 Obesity, unspecified: Secondary | ICD-10-CM | POA: Diagnosis present

## 2019-10-02 DIAGNOSIS — A419 Sepsis, unspecified organism: Secondary | ICD-10-CM | POA: Diagnosis present

## 2019-10-02 DIAGNOSIS — E1151 Type 2 diabetes mellitus with diabetic peripheral angiopathy without gangrene: Secondary | ICD-10-CM | POA: Diagnosis present

## 2019-10-02 DIAGNOSIS — B1920 Unspecified viral hepatitis C without hepatic coma: Secondary | ICD-10-CM | POA: Diagnosis present

## 2019-10-02 DIAGNOSIS — I10 Essential (primary) hypertension: Secondary | ICD-10-CM | POA: Diagnosis present

## 2019-10-02 DIAGNOSIS — E1169 Type 2 diabetes mellitus with other specified complication: Secondary | ICD-10-CM | POA: Diagnosis present

## 2019-10-02 DIAGNOSIS — N368 Other specified disorders of urethra: Secondary | ICD-10-CM | POA: Diagnosis present

## 2019-10-02 DIAGNOSIS — G8929 Other chronic pain: Secondary | ICD-10-CM | POA: Diagnosis present

## 2019-10-02 DIAGNOSIS — Z89512 Acquired absence of left leg below knee: Secondary | ICD-10-CM | POA: Diagnosis not present

## 2019-10-02 DIAGNOSIS — M79606 Pain in leg, unspecified: Secondary | ICD-10-CM | POA: Diagnosis present

## 2019-10-02 DIAGNOSIS — M4624 Osteomyelitis of vertebra, thoracic region: Secondary | ICD-10-CM | POA: Diagnosis present

## 2019-10-02 DIAGNOSIS — Y731 Therapeutic (nonsurgical) and rehabilitative gastroenterology and urology devices associated with adverse incidents: Secondary | ICD-10-CM | POA: Diagnosis present

## 2019-10-02 DIAGNOSIS — Z23 Encounter for immunization: Secondary | ICD-10-CM

## 2019-10-02 DIAGNOSIS — Z79899 Other long term (current) drug therapy: Secondary | ICD-10-CM

## 2019-10-02 DIAGNOSIS — E875 Hyperkalemia: Secondary | ICD-10-CM | POA: Diagnosis present

## 2019-10-02 DIAGNOSIS — Z89511 Acquired absence of right leg below knee: Secondary | ICD-10-CM

## 2019-10-02 DIAGNOSIS — F419 Anxiety disorder, unspecified: Secondary | ICD-10-CM | POA: Diagnosis present

## 2019-10-02 LAB — BASIC METABOLIC PANEL
Anion gap: 12 (ref 5–15)
BUN: 27 mg/dL — ABNORMAL HIGH (ref 6–20)
CO2: 20 mmol/L — ABNORMAL LOW (ref 22–32)
Calcium: 8.7 mg/dL — ABNORMAL LOW (ref 8.9–10.3)
Chloride: 101 mmol/L (ref 98–111)
Creatinine, Ser: 1.2 mg/dL (ref 0.61–1.24)
GFR calc Af Amer: >60 mL/min (ref >60)
GFR calc non Af Amer: >60 mL/min (ref >60)
Glucose, Bld: 290 mg/dL — ABNORMAL HIGH (ref 70–99)
Potassium: 4.7 mmol/L (ref 3.5–5.1)
Sodium: 133 mmol/L — ABNORMAL LOW (ref 135–145)

## 2019-10-02 LAB — CBC WITH DIFFERENTIAL/PLATELET
Abs Immature Granulocytes: 0.08 10*3/uL — ABNORMAL HIGH (ref 0.00–0.07)
Basophils Absolute: 0.1 10*3/uL (ref 0.0–0.1)
Basophils Relative: 0 %
Eosinophils Absolute: 0.1 10*3/uL (ref 0.0–0.5)
Eosinophils Relative: 1 %
HCT: 35.6 % — ABNORMAL LOW (ref 39.0–52.0)
Hemoglobin: 11.8 g/dL — ABNORMAL LOW (ref 13.0–17.0)
Immature Granulocytes: 1 %
Lymphocytes Relative: 4 %
Lymphs Abs: 0.6 10*3/uL — ABNORMAL LOW (ref 0.7–4.0)
MCH: 30.5 pg (ref 26.0–34.0)
MCHC: 33.1 g/dL (ref 30.0–36.0)
MCV: 92 fL (ref 80.0–100.0)
Monocytes Absolute: 0.6 10*3/uL (ref 0.1–1.0)
Monocytes Relative: 4 %
Neutro Abs: 14 10*3/uL — ABNORMAL HIGH (ref 1.7–7.7)
Neutrophils Relative %: 90 %
Platelets: 262 10*3/uL (ref 150–400)
RBC: 3.87 MIL/uL — ABNORMAL LOW (ref 4.22–5.81)
RDW: 13.1 % (ref 11.5–15.5)
WBC: 15.4 10*3/uL — ABNORMAL HIGH (ref 4.0–10.5)
nRBC: 0 % (ref 0.0–0.2)

## 2019-10-02 LAB — URINALYSIS, COMPLETE (UACMP) WITH MICROSCOPIC
Bilirubin Urine: NEGATIVE
Glucose, UA: 50 mg/dL — AB
Ketones, ur: NEGATIVE mg/dL
Nitrite: POSITIVE — AB
Protein, ur: 30 mg/dL — AB
Specific Gravity, Urine: 1.013 (ref 1.005–1.030)
Squamous Epithelial / HPF: NONE SEEN (ref 0–5)
WBC, UA: >50 WBC/hpf — ABNORMAL HIGH (ref 0–5)
pH: 5 (ref 5.0–8.0)

## 2019-10-02 LAB — URINE DRUG SCREEN, QUALITATIVE (ARMC ONLY)
Amphetamines, Ur Screen: NOT DETECTED
Barbiturates, Ur Screen: POSITIVE — AB
Benzodiazepine, Ur Scrn: NOT DETECTED
Cannabinoid 50 Ng, Ur ~~LOC~~: NOT DETECTED
Cocaine Metabolite,Ur ~~LOC~~: NOT DETECTED
MDMA (Ecstasy)Ur Screen: NOT DETECTED
Methadone Scn, Ur: NOT DETECTED
Opiate, Ur Screen: NOT DETECTED
Phencyclidine (PCP) Ur S: NOT DETECTED
Tricyclic, Ur Screen: NOT DETECTED

## 2019-10-02 LAB — GLUCOSE, CAPILLARY
Glucose-Capillary: 193 mg/dL — ABNORMAL HIGH (ref 70–99)
Glucose-Capillary: 215 mg/dL — ABNORMAL HIGH (ref 70–99)
Glucose-Capillary: 268 mg/dL — ABNORMAL HIGH (ref 70–99)
Glucose-Capillary: 280 mg/dL — ABNORMAL HIGH (ref 70–99)

## 2019-10-02 LAB — LACTIC ACID, PLASMA: Lactic Acid, Venous: 1.3 mmol/L (ref 0.5–1.9)

## 2019-10-02 MED ORDER — PREGABALIN 75 MG PO CAPS
75.0000 mg | ORAL_CAPSULE | Freq: Two times a day (BID) | ORAL | Status: DC
  Administered 2019-10-02 – 2019-10-07 (×11): 75 mg via ORAL
  Filled 2019-10-02 (×12): qty 1

## 2019-10-02 MED ORDER — FLEET PEDIATRIC 3.5-9.5 GM/59ML RE ENEM
1.0000 | ENEMA | RECTAL | Status: DC | PRN
  Filled 2019-10-02: qty 1

## 2019-10-02 MED ORDER — CHLORHEXIDINE GLUCONATE CLOTH 2 % EX PADS
6.0000 | MEDICATED_PAD | Freq: Every day | CUTANEOUS | Status: DC
  Administered 2019-10-03 – 2019-10-06 (×3): 6 via TOPICAL

## 2019-10-02 MED ORDER — INFLUENZA VAC SPLIT QUAD 0.5 ML IM SUSY
0.5000 mL | PREFILLED_SYRINGE | INTRAMUSCULAR | Status: AC
  Administered 2019-10-07: 16:00:00 0.5 mL via INTRAMUSCULAR
  Filled 2019-10-02 (×2): qty 0.5

## 2019-10-02 MED ORDER — OXYCODONE HCL ER 10 MG PO T12A
10.0000 mg | EXTENDED_RELEASE_TABLET | Freq: Two times a day (BID) | ORAL | Status: DC
  Administered 2019-10-02 – 2019-10-07 (×10): 10 mg via ORAL
  Filled 2019-10-02 (×11): qty 1

## 2019-10-02 MED ORDER — HYDRALAZINE HCL 50 MG PO TABS
50.0000 mg | ORAL_TABLET | Freq: Three times a day (TID) | ORAL | Status: DC
  Administered 2019-10-03 – 2019-10-05 (×6): 50 mg via ORAL
  Filled 2019-10-02 (×7): qty 1

## 2019-10-02 MED ORDER — SODIUM CHLORIDE 0.9 % IV SOLN
1.0000 g | Freq: Once | INTRAVENOUS | Status: AC
  Administered 2019-10-02: 08:00:00 1 g via INTRAVENOUS
  Filled 2019-10-02 (×2): qty 10

## 2019-10-02 MED ORDER — POLYETHYLENE GLYCOL 3350 17 G PO PACK
17.0000 g | PACK | Freq: Every day | ORAL | Status: DC
  Administered 2019-10-02: 11:00:00 17 g via ORAL
  Filled 2019-10-02 (×6): qty 1

## 2019-10-02 MED ORDER — LORATADINE 10 MG PO TABS
10.0000 mg | ORAL_TABLET | Freq: Every day | ORAL | Status: DC
  Administered 2019-10-02 – 2019-10-07 (×6): 10 mg via ORAL
  Filled 2019-10-02 (×6): qty 1

## 2019-10-02 MED ORDER — NICOTINE 21 MG/24HR TD PT24
21.0000 mg | MEDICATED_PATCH | Freq: Every day | TRANSDERMAL | Status: DC
  Administered 2019-10-02 – 2019-10-07 (×6): 21 mg via TRANSDERMAL
  Filled 2019-10-02 (×6): qty 1

## 2019-10-02 MED ORDER — FERROUS GLUCONATE 324 (38 FE) MG PO TABS
324.0000 mg | ORAL_TABLET | Freq: Every day | ORAL | Status: DC
  Administered 2019-10-03 – 2019-10-07 (×5): 324 mg via ORAL
  Filled 2019-10-02 (×6): qty 1

## 2019-10-02 MED ORDER — ONDANSETRON HCL 4 MG/2ML IJ SOLN
4.0000 mg | Freq: Four times a day (QID) | INTRAMUSCULAR | Status: DC | PRN
  Administered 2019-10-07: 13:00:00 4 mg via INTRAVENOUS
  Filled 2019-10-02: qty 2

## 2019-10-02 MED ORDER — METOPROLOL SUCCINATE ER 25 MG PO TB24
25.0000 mg | ORAL_TABLET | Freq: Every day | ORAL | Status: DC
  Administered 2019-10-02 – 2019-10-07 (×6): 25 mg via ORAL
  Filled 2019-10-02 (×6): qty 1

## 2019-10-02 MED ORDER — INSULIN ASPART 100 UNIT/ML ~~LOC~~ SOLN
0.0000 [IU] | Freq: Three times a day (TID) | SUBCUTANEOUS | Status: DC
  Administered 2019-10-02: 13:00:00 5 [IU] via SUBCUTANEOUS
  Administered 2019-10-02: 17:00:00 8 [IU] via SUBCUTANEOUS
  Administered 2019-10-03: 12:00:00 5 [IU] via SUBCUTANEOUS
  Administered 2019-10-03: 17:00:00 3 [IU] via SUBCUTANEOUS
  Administered 2019-10-03: 08:00:00 5 [IU] via SUBCUTANEOUS
  Administered 2019-10-04: 13:00:00 8 [IU] via SUBCUTANEOUS
  Administered 2019-10-04 – 2019-10-05 (×2): 5 [IU] via SUBCUTANEOUS
  Administered 2019-10-05: 12:00:00 8 [IU] via SUBCUTANEOUS
  Administered 2019-10-05: 09:00:00 5 [IU] via SUBCUTANEOUS
  Administered 2019-10-06 (×2): 8 [IU] via SUBCUTANEOUS
  Administered 2019-10-06: 10:00:00 3 [IU] via SUBCUTANEOUS
  Administered 2019-10-07: 17:00:00 5 [IU] via SUBCUTANEOUS
  Administered 2019-10-07: 13:00:00 3 [IU] via SUBCUTANEOUS
  Administered 2019-10-07: 09:00:00 5 [IU] via SUBCUTANEOUS
  Filled 2019-10-02 (×16): qty 1

## 2019-10-02 MED ORDER — ONDANSETRON HCL 4 MG PO TABS: 4.0000 mg | ORAL_TABLET | Freq: Four times a day (QID) | ORAL | Status: DC | PRN

## 2019-10-02 MED ORDER — ACETAMINOPHEN 325 MG PO TABS
650.0000 mg | ORAL_TABLET | Freq: Four times a day (QID) | ORAL | Status: DC | PRN
  Administered 2019-10-02: 17:00:00 650 mg via ORAL
  Filled 2019-10-02: qty 2

## 2019-10-02 MED ORDER — SODIUM CHLORIDE 0.9 % IV BOLUS
1000.0000 mL | Freq: Once | INTRAVENOUS | Status: AC
  Administered 2019-10-02: 07:00:00 1000 mL via INTRAVENOUS

## 2019-10-02 MED ORDER — OXYCODONE-ACETAMINOPHEN 5-325 MG PO TABS
1.0000 | ORAL_TABLET | Freq: Once | ORAL | Status: AC
  Administered 2019-10-02: 06:00:00 1 via ORAL
  Filled 2019-10-02: qty 1

## 2019-10-02 MED ORDER — ACETAMINOPHEN 325 MG PO TABS
ORAL_TABLET | ORAL | Status: AC
  Administered 2019-10-02: 06:00:00 325 mg via ORAL
  Filled 2019-10-02: qty 1

## 2019-10-02 MED ORDER — ACETAMINOPHEN 650 MG RE SUPP: 650.0000 mg | Freq: Four times a day (QID) | RECTAL | Status: DC | PRN

## 2019-10-02 MED ORDER — FAMOTIDINE 20 MG PO TABS
20.0000 mg | ORAL_TABLET | Freq: Every day | ORAL | Status: DC
  Administered 2019-10-02 – 2019-10-07 (×6): 20 mg via ORAL
  Filled 2019-10-02 (×6): qty 1

## 2019-10-02 MED ORDER — AMLODIPINE BESYLATE 10 MG PO TABS
10.0000 mg | ORAL_TABLET | Freq: Every day | ORAL | Status: DC
  Administered 2019-10-03 – 2019-10-07 (×5): 10 mg via ORAL
  Filled 2019-10-02 (×5): qty 1

## 2019-10-02 MED ORDER — METHOCARBAMOL 500 MG PO TABS
1000.0000 mg | ORAL_TABLET | Freq: Four times a day (QID) | ORAL | Status: DC
  Administered 2019-10-02 – 2019-10-07 (×21): 1000 mg via ORAL
  Filled 2019-10-02 (×26): qty 2

## 2019-10-02 MED ORDER — CEPHALEXIN 500 MG PO CAPS: 500.0000 mg | ORAL_CAPSULE | Freq: Three times a day (TID) | ORAL | 0 refills | Status: DC

## 2019-10-02 MED ORDER — TRAZODONE HCL 50 MG PO TABS
50.0000 mg | ORAL_TABLET | Freq: Every day | ORAL | Status: DC
  Administered 2019-10-02 – 2019-10-06 (×5): 50 mg via ORAL
  Filled 2019-10-02 (×5): qty 1

## 2019-10-02 MED ORDER — BISACODYL 5 MG PO TBEC
10.0000 mg | DELAYED_RELEASE_TABLET | Freq: Every day | ORAL | Status: DC
  Administered 2019-10-02 – 2019-10-07 (×6): 10 mg via ORAL
  Filled 2019-10-02 (×6): qty 2

## 2019-10-02 MED ORDER — SODIUM CHLORIDE 0.9 % IV BOLUS
1000.0000 mL | Freq: Once | INTRAVENOUS | Status: AC
  Administered 2019-10-02: 11:00:00 1000 mL via INTRAVENOUS

## 2019-10-02 MED ORDER — ENOXAPARIN SODIUM 40 MG/0.4ML ~~LOC~~ SOLN
40.0000 mg | SUBCUTANEOUS | Status: DC
  Administered 2019-10-03 – 2019-10-05 (×3): 40 mg via SUBCUTANEOUS
  Filled 2019-10-02 (×4): qty 0.4

## 2019-10-02 MED ORDER — ENOXAPARIN SODIUM 40 MG/0.4ML ~~LOC~~ SOLN: 40.0000 mg | SUBCUTANEOUS | Status: DC

## 2019-10-02 MED ORDER — OXYCODONE HCL 5 MG PO TABS
10.0000 mg | ORAL_TABLET | ORAL | Status: DC | PRN
  Administered 2019-10-02 – 2019-10-07 (×25): 10 mg via ORAL
  Filled 2019-10-02 (×26): qty 2

## 2019-10-02 MED ORDER — INSULIN GLARGINE 100 UNIT/ML ~~LOC~~ SOLN
20.0000 [IU] | Freq: Every day | SUBCUTANEOUS | Status: DC
  Administered 2019-10-02 – 2019-10-06 (×5): 20 [IU] via SUBCUTANEOUS
  Filled 2019-10-02 (×6): qty 0.2

## 2019-10-02 MED ORDER — SODIUM CHLORIDE 0.9 % IV SOLN
1.0000 g | INTRAVENOUS | Status: DC
  Administered 2019-10-03 – 2019-10-07 (×5): 1 g via INTRAVENOUS
  Filled 2019-10-02 (×5): qty 1

## 2019-10-02 MED ORDER — DOCUSATE SODIUM 100 MG PO CAPS
100.0000 mg | ORAL_CAPSULE | Freq: Two times a day (BID) | ORAL | Status: DC
  Administered 2019-10-02 – 2019-10-07 (×11): 100 mg via ORAL
  Filled 2019-10-02 (×12): qty 1

## 2019-10-02 MED ORDER — MELATONIN 5 MG PO TABS
5.0000 mg | ORAL_TABLET | Freq: Every day | ORAL | Status: DC
  Administered 2019-10-02 – 2019-10-06 (×5): 5 mg via ORAL
  Filled 2019-10-02 (×7): qty 1

## 2019-10-02 MED ORDER — OXYCODONE ER 9 MG PO C12A: 9.0000 mg | EXTENDED_RELEASE_CAPSULE | Freq: Two times a day (BID) | ORAL | Status: DC

## 2019-10-02 MED ORDER — MAGNESIUM HYDROXIDE 400 MG/5ML PO SUSP: 30.0000 mL | Freq: Every day | ORAL | Status: DC | PRN

## 2019-10-02 MED ORDER — ACETAMINOPHEN 325 MG PO TABS: 325.0000 mg | ORAL_TABLET | Freq: Once | ORAL | Status: AC

## 2019-10-02 MED ORDER — FUROSEMIDE 40 MG PO TABS
40.0000 mg | ORAL_TABLET | Freq: Two times a day (BID) | ORAL | Status: DC
  Administered 2019-10-03 – 2019-10-07 (×9): 40 mg via ORAL
  Filled 2019-10-02 (×10): qty 1

## 2019-10-02 MED ORDER — PNEUMOCOCCAL VAC POLYVALENT 25 MCG/0.5ML IJ INJ
0.5000 mL | INJECTION | INTRAMUSCULAR | Status: AC
  Administered 2019-10-07: 16:00:00 0.5 mL via INTRAMUSCULAR
  Filled 2019-10-02: qty 0.5

## 2019-10-02 NOTE — ED Provider Notes (Signed)
[facility] Emergency Department Provider Note  ____________________________________________  Time seen: Approximately 7:21 AM  I have reviewed the triage vital signs and the nursing notes.   HISTORY  Chief Complaint Hematuria   HPI Eugene [name] is a 59 y.o. male with history of hepatitis C, osteomyelitis, spinal stenosis with chronic urinary retention with an indwelling suprapubic catheter, hyperlipidemia, diabetes who presents from his nursing home for dislodge catheter.  Patient reports that he had a indwelling Foley catheter for several years.  That was exchanged for a suprapubic catheter about 2 months ago.  Earlier today the catheter became dislodged.  He is not sure why the nursing home did not try to replace it.  Throughout the day patient started having urinary retention and they attempted twice to place a Foley catheter unsuccessfully.  Patient started having bleeding from his penis which prompted the transfer to the emergency room.  Patient arrives with severely distended bladder and complaining of severe pain.  He also reports burning in his penis for a few days. Patient is also COVID positive as of 12/24. Denies cough, CP, SOB.  Past Medical History:  Diagnosis Date  . Anxiety   . Depression   . Diabetes mellitus without complication (HCC)   . Hepatitis C   . Hyperlipidemia   . IBS (irritable bowel syndrome)   . Osteomyelitis (HCC)   . Spinal stenosis     Patient Active Problem List   Diagnosis Date Noted  . Sepsis (HCC) 10/02/2019  . MRSA bacteremia 02/08/2019  . Osteomyelitis of thoracic spine (HCC) 02/08/2019  . Peripheral vascular disease (HCC) 02/08/2019  . Hypertension 02/08/2019  . Dyslipidemia 02/08/2019  . Chronic pain syndrome 02/08/2019  . Polysubstance abuse (HCC) 02/08/2019  . Chronic hepatitis C without hepatic coma (HCC) 02/08/2019  . GERD (gastroesophageal reflux disease) 02/08/2019  . Cigarette smoker 02/08/2019  .  Normocytic anemia 02/08/2019  . UTI (urinary tract infection) 02/07/2019  . AKI (acute kidney injury) (HCC) 02/07/2019  . Hyperkalemia 02/07/2019  . Diabetes (HCC) 02/07/2019  . Pressure injury of skin 02/07/2019  . Hyperlipidemia 11/07/2018  . MRSA (methicillin resistant Staphylococcus aureus) septicemia (HCC) 08/17/2017  . Other acute osteomyelitis, left ankle and foot (HCC) 06/29/2017  . Diabetic ulcer of right foot associated with diabetes mellitus due to underlying condition (HCC) 06/27/2017    Past Surgical History:  Procedure Laterality Date  . bilateral amputation Bilateral   . CENTRAL LINE INSERTION-TUNNELED N/A 02/02/2019   Procedure: CENTRAL LINE INSERTION-TUNNELED;  Surgeon: Annice Needy, MD;  Location: [facility] INVASIVE CV LAB;  Service: Cardiovascular;  Laterality: N/A;  . SPINAL FUSION    . THORACIC SPINE SURGERY  01/2019   extensive washout    Prior to Admission medications   Medication Sig Start Date End Date Taking? Authorizing Provider  acetaminophen (TYLENOL) 325 MG tablet Take 650 mg by mouth every 4 (four) hours as needed for mild pain or fever.   Yes [provider]  amLODipine (NORVASC) 10 MG tablet Take 10 mg by mouth daily.   Yes [provider]  bisacodyl (DULCOLAX) 5 MG EC tablet Take 10 mg by mouth daily.   Yes [provider]  docusate sodium (COLACE) 100 MG capsule Take 100 mg by mouth 2 (two) times daily.   Yes [provider]  doxycycline (VIBRAMYCIN) 50 MG capsule Take 100 mg by mouth 2 (two) times daily.   Yes [provider]  famotidine (PEPCID) 20 MG tablet Take 20 mg by mouth  daily.    Yes [provider]  ferrous gluconate (FERGON) 324 MG tablet Take 324 mg by mouth 2 (two) times daily with a meal.    Yes [provider]  furosemide (LASIX) 40 MG tablet Take 40 mg by mouth 2 (two) times daily.   Yes [provider]  hydrALAZINE (APRESOLINE) 50 MG tablet Take 50 mg by mouth 3  (three) times daily.   Yes [provider]  insulin glargine (LANTUS) 100 UNIT/ML injection Inject 20 Units into the skin at bedtime.    Yes [provider]  insulin lispro (HUMALOG) 100 UNIT/ML injection Inject 0-15 Units into the skin 4 (four) times daily -  before meals and at bedtime. <150= 0 units 151-200= 3 units 201-250= 6 units 251-300= 9 units 301-350= 12 units 351-400= 15 units >400 call MD   Yes [provider]  loratadine (CLARITIN) 10 MG tablet Take 10 mg by mouth daily.   Yes [provider]  magnesium hydroxide (MILK OF MAGNESIA) 400 MG/5ML suspension Take 30 mLs by mouth daily as needed for mild constipation. 03/04/19  Yes Katha HammingKonidena, Snehalatha, MD  Melatonin 5 MG TABS Take 5 mg by mouth at bedtime.   Yes [provider]  methocarbamol (ROBAXIN) 500 MG tablet Take 1,000 mg by mouth 4 (four) times daily.    Yes [provider]  metoprolol succinate (TOPROL-XL) 25 MG 24 hr tablet Take 25 mg by mouth daily.   Yes [provider]  ondansetron (ZOFRAN) 4 MG tablet Take 4 mg by mouth every 6 (six) hours as needed for nausea or vomiting.   Yes [provider]  oxyCODONE 10 MG TABS Take 1 tablet (10 mg total) by mouth every 4 (four) hours as needed for moderate pain or severe pain. 03/04/19  Yes Katha HammingKonidena, Snehalatha, MD  oxyCODONE ER 9 MG C12A Take 9 mg by mouth 2 (two) times daily.   Yes [provider]  polyethylene glycol (MIRALAX / GLYCOLAX) 17 g packet Take 17 g by mouth daily.   Yes [provider]  pregabalin (LYRICA) 75 MG capsule Take 75 mg by mouth 2 (two) times daily.   Yes [provider]  sodium phosphate Pediatric (FLEET) 3.5-9.5 GM/59ML enema Place 1 enema rectally every other day as needed for severe constipation.   Yes [provider]  traZODone (DESYREL) 50 MG tablet Take 50 mg by mouth at bedtime.    Yes [provider]  cephALEXin (KEFLEX) 500 MG capsule  Take 1 capsule (500 mg total) by mouth 3 (three) times daily for 7 days. 10/02/19 10/09/19  Nita SickleVeronese, [place], MD  lisinopril (ZESTRIL) 10 MG tablet Take 10 mg by mouth daily.     [provider]    Allergies Patient has no known allergies.  History reviewed. No pertinent family history.  Social History Social History   Tobacco Use  . Smoking status: Current Every Day Smoker    Packs/day: 0.25    Types: Cigarettes  . Smokeless tobacco: Never Used  Substance Use Topics  . Alcohol use: Not Currently  . Drug use: Not Currently    Review of Systems  Constitutional: Negative for fever. Eyes: Negative for visual changes. ENT: Negative for sore throat. Neck: No neck pain  Cardiovascular: Negative for chest pain. Respiratory: Negative for shortness of breath. Gastrointestinal: Negative for abdominal pain, vomiting or diarrhea. Genitourinary: Negative for dysuria. + urinary retention Musculoskeletal: Negative for back pain. Skin: Negative for rash. Neurological: Negative for headaches, weakness  or numbness. Psych: No SI or HI  ____________________________________________   PHYSICAL EXAM:  VITAL SIGNS: ED Triage Vitals  Enc Vitals Group     BP 10/02/19 0523 (!) 121/94     Pulse Rate 10/02/19 0523 (!) 119     Resp 10/02/19 0523 20     Temp 10/02/19 0523 (!) 102.4 F (39.1 C)     Temp Source 10/02/19 0523 Oral     SpO2 10/02/19 0523 98 %     Weight 10/02/19 0528 250 lb (113.4 kg)     Height 10/02/19 0528 6' (1.829 m)     Head Circumference --      Peak Flow --      Pain Score 10/02/19 0526 10     Pain Loc --      Pain Edu? --      Excl. in GC? --     Constitutional: Alert and oriented. Well appearing and in no apparent distress. HEENT:      Head: Normocephalic and atraumatic.         Eyes: Conjunctivae are normal. Sclera is non-icteric.       Mouth/Throat: Mucous membranes are moist.       Neck: Supple with no signs of meningismus. Cardiovascular:  Tachycardic with regular. Respiratory: Normal respiratory effort. Lungs are clear to auscultation bilaterally. No wheezes, crackles, or rhonchi.  Gastrointestinal: Soft, severely distended bladder  Genitourinary: No CVA tenderness. Musculoskeletal: Nontender with normal range of motion in all extremities. No edema, cyanosis, or erythema of extremities. Neurologic: Normal speech and language. Face is symmetric. Moving all extremities. No gross focal neurologic deficits are appreciated. Skin: Skin is warm, dry and intact. No rash noted. Psychiatric: Mood and affect are normal. Speech and behavior are normal.  ____________________________________________   LABS (all labs ordered are listed, but only abnormal results are displayed)  Labs Reviewed  CBC WITH DIFFERENTIAL/PLATELET - Abnormal; Notable for the following components:      Result Value   WBC 15.4 (*)    RBC 3.87 (*)    Hemoglobin 11.8 (*)    HCT 35.6 (*)    Neutro Abs 14.0 (*)    Lymphs Abs 0.6 (*)    Abs Immature Granulocytes 0.08 (*)    All other components within normal limits  BASIC METABOLIC PANEL - Abnormal; Notable for the following components:   Sodium 133 (*)    CO2 20 (*)    Glucose, Bld 290 (*)    BUN 27 (*)    Calcium 8.7 (*)    All other components within normal limits  URINE DRUG SCREEN, QUALITATIVE ([facility] ONLY) - Abnormal; Notable for the following components:   Barbiturates, Ur Screen POSITIVE (*)    All other components within normal limits  URINALYSIS, COMPLETE (UACMP) WITH MICROSCOPIC - Abnormal; Notable for the following components:   Color, Urine YELLOW (*)    APPearance CLOUDY (*)    Glucose, UA 50 (*)    Hgb urine dipstick SMALL (*)    Protein, ur 30 (*)    Nitrite POSITIVE (*)    Leukocytes,Ua MODERATE (*)    WBC, UA >50 (*)    Bacteria, UA FEW (*)    All other components within normal limits  CULTURE, BLOOD (ROUTINE X 2)  CULTURE, BLOOD (ROUTINE X 2)  LACTIC ACID, PLASMA    ____________________________________________  EKG  ED ECG REPORT I, Nita Sickle, the attending physician, personally viewed and interpreted this ECG.  Sinus tachycardia, rate of 101, normal intervals,  normal axis, no ST elevations or depressions. ____________________________________________  RADIOLOGY  I have personally reviewed the images performed during this visit and I agree with the Radiologist's read.   Interpretation by Radiologist:  DG Chest Port 1 View  Result Date: 10/02/2019 CLINICAL DATA:  Sepsis and a COVID-19 positive patient. EXAM: PORTABLE CHEST 1 VIEW COMPARISON:  Single-view of the chest 02/09/2019. FINDINGS: The lungs are clear. Heart size is normal. No pneumothorax or pleural effusion. Postoperative change thoracic fusion and lower thoracic corpectomy noted. No acute bony abnormality IMPRESSION: No acute disease. Electronically Signed   By: Inge Rise M.D.   On: 10/02/2019 07:18    ____________________________________________   PROCEDURES  Procedure(s) performed: BLADDER CATHETERIZATION  Date/Time: 10/02/2019 7:27 AM Performed by: Rudene Re, MD Authorized by: Rudene Re, MD   Consent:    Consent obtained:  Verbal   Consent given by:  Patient   Risks discussed:  False passage, incomplete procedure, pain, infection and urethral injury   Alternatives discussed:  Delayed treatment Pre-procedure details:    Procedure purpose:  Therapeutic   Preparation: Patient was prepped and draped in usual sterile fashion   Anesthesia (see MAR for exact dosages):    Anesthesia method:  Topical application   Topical anesthetic:  Lidocaine gel Procedure details:    Provider performed due to:  Altered anatomy   Altered anatomy:  Trauma   Catheter insertion:  Indwelling   Catheter type:  Coude   Catheter size:  16 Fr   Bladder irrigation: no     Number of attempts:  1   Urine characteristics:  Cloudy Post-procedure details:    Patient  tolerance of procedure:  Tolerated well, no immediate complications      Critical Care performed: yes  CRITICAL CARE Performed by: Rudene Re  ?  Total critical care time: 35 min  Critical care time was exclusive of separately billable procedures and treating other patients.  Critical care was necessary to treat or prevent imminent or life-threatening deterioration.  Critical care was time spent personally by me on the following activities: development of treatment plan with patient and/or surrogate as well as nursing, discussions with consultants, evaluation of patient's response to treatment, examination of patient, obtaining history from patient or surrogate, ordering and performing treatments and interventions, ordering and review of laboratory studies, ordering and review of radiographic studies, pulse oximetry and re-evaluation of patient's condition.  ____________________________________________   INITIAL IMPRESSION / ASSESSMENT AND PLAN / ED COURSE  59 y.o. male with history of hepatitis C, osteomyelitis, spinal stenosis with chronic urinary retention with an indwelling suprapubic catheter, hyperlipidemia, diabetes who presents from his nursing home for dislodge catheter.  Patient arrives with a fever of 102.88F, tachycardic with severely distended bladder.  Lots of blood in the urethral meatus.  I attempted initially to replace a suprapubic Foley however that was unsuccessful due to severely distended bladder.  Then I placed a coud catheter successfully. Catheter was placed by me due to abnormal anatomy caused by chronic indwelling foley catheter.  Patient had 1.5 L of urine drained.  The urine was very cloudy and towards the end purulent.  Labs were done showing no AKI but did show an elevated white count.  Together with the fever, tachycardia and a purulent urine concerning for urosepsis.  Patient was given Rocephin, IV fluids and Tylenol.  He will be admitted to the  hospitalist service for IV antibiotics.       As part of my medical decision making, I  reviewed the following data within the electronic MEDICAL RECORD NUMBER Nursing notes reviewed and incorporated, Labs reviewed , EKG interpreted , Old chart reviewed, Radiograph reviewed , Discussed with admitting physician , Notes from prior ED visits and [place] Controlled Substance Database   Please note:  Patient was evaluated in Emergency Department today for the symptoms described in the history of present illness. Patient was evaluated in the context of the global COVID-19 pandemic, which necessitated consideration that the patient might be at risk for infection with the SARS-CoV-2 virus that causes COVID-19. Institutional protocols and algorithms that pertain to the evaluation of patients at risk for COVID-19 are in a state of rapid change based on information released by regulatory bodies including the CDC and federal and state organizations. These policies and algorithms were followed during the patient's care in the ED.  Some ED evaluations and interventions may be delayed as a result of limited staffing during the pandemic.   ____________________________________________   FINAL CLINICAL IMPRESSION(S) / ED DIAGNOSES   Final diagnoses:  Displacement of Foley catheter, initial encounter (HCC)  Sepsis due to urinary tract infection (HCC)      NEW MEDICATIONS STARTED DURING THIS VISIT:  ED Discharge Orders         Ordered    cephALEXin (KEFLEX) 500 MG capsule  3 times daily     10/02/19 2423           Note:  This document was prepared using Dragon voice recognition software and may include unintentional dictation errors.    Don Perking, [place], MD 10/02/19 [phone]

## 2019-10-02 NOTE — ED Notes (Signed)
Report given to Tamika, RN.

## 2019-10-02 NOTE — ED Notes (Signed)
Patient given ED meal tray.

## 2019-10-02 NOTE — ED Notes (Signed)
Urology at bedside.

## 2019-10-02 NOTE — ED Triage Notes (Signed)
Pt brought in by EMS from [facility] for bleeding from penis. Pt had suprapubic which came out and staff attempted to insert catheter in penis without success. They attempted several times without success and then they noted mod amount of bleeding from penis.

## 2019-10-02 NOTE — Discharge Instructions (Addendum)
Catheter care  ?Always wash your hands before and after touching your catheter.  ?Check the area around the urethra for inflammation or signs of infection. Signs of infection include irritated, swollen, red, or tender skin, or pus around the catheter.  ?Clean the area around the catheter with soap and water two times a day. Dry with a clean towel afterward.  ?Do not apply powder or lotion to the skin around the catheter. ? ?To empty the urine collection bag  ?Wash your hands with soap and water.  ?Without touching the drain spout, remove the spout from its sleeve at the bottom of the collection bag. Open the valve on the spout.  ?Let the urine flow out of the bag and into the toilet or a container. Do not let the tubing or drain spout touch anything.  ?After you empty the bag, clean the end of the drain spout with tissue and water. Close the valve and put the drain spout back into its sleeve at the bottom of the collection bag.  ?Wash your hands with soap and water. ? ?

## 2019-10-02 NOTE — ED Notes (Signed)
IV team at bedside 

## 2019-10-02 NOTE — Progress Notes (Signed)
Patient ID: Eugene [name], male   DOB: 08-Jan-1961, 59 y.o.   MRN: [id number]   Request made for placement of an image guided suprapubic catheter.  Image guided to pubic catheter was initially placed on 09/07/2019, however was subsequently pulled out by the patient requiring definitive new placement on 09/17/2019, and again, the patient returns today after removing the suprapubic catheter.  Patient's intolerance of the suprapubic catheter was discussed with the patient's urologist, Dr. Richardo Hanks, and as the patient is currently decompressed with a Foley catheter, the decision was made NOT to pursue image guided placement/replacement of the suprapubic catheter at this time given patient's apparent inability to tolerate the catheter.  Dr. Richardo Hanks plans to see the patient once discharged from the hospital and recovered from this current admission.  Katherina Right, MD Pager #: [phone]

## 2019-10-02 NOTE — ED Notes (Signed)
Dr. Don Perking attempted to reinsert catheter into supra pubic site without success. !6 fr coude catheter inserted into urethra with blood tinged urine noted. of urine noted in bag, pt states bladder pain is relieved.

## 2019-10-02 NOTE — Progress Notes (Signed)
CODE SEPSIS - PHARMACY COMMUNICATION  **Broad Spectrum Antibiotics should be administered within 1 hour of Sepsis diagnosis**  Time Code Sepsis Called/Page Received: 6283  Antibiotics Ordered: Rocephin  Time of 1st antibiotic administration: 0745  Additional action taken by pharmacy: n/a   If necessary, Name of Provider/Nurse Contacted: n/a  Wayland Denis ,PharmD Clinical Pharmacist  10/02/2019  6:54 AM

## 2019-10-02 NOTE — Consult Note (Signed)
Urology Consult  I have been asked to see the patient by Dr. Myriam Forehand, for evaluation and management of urinary retention with SPT failure.  Chief Complaint: SPT fell out, difficult Foley catheter placement  History of Present Illness: Eugene [name] is a comorbid 59 y.o. year old male who presented to the ED today transported by EMS from his nursing home.  He reports that his suprapubic tube, originally placed on 09/07/2019, was dislodged at approximately 1600 yesterday.  Nursing staff made 2 unsuccessful attempts to insert a urethral catheter with subsequent bleeding from the urethral meatus.  Upon presentation, ED staff made 1 unsuccessful attempted to replace SPT.  Ultimately, they were able to place a 16 [place] coud catheter per urethra with immediate drainage of 1500 mL of cloudy to purulent urine.  Upon presentation, patient reported severe abdominal pain and was noted to have significant abdominal distention improved following Foley catheter placement.  Patient also reported recent penile burning x several days.  He was noted to be febrile to 102.25F and tachycardic.  Sepsis protocol initiated with concern for urosepsis.  On antibiotics as below.  Admission labs significant for WBC count 15.4, creatinine 1.20, grossly positive UA, and lactate WNL.  Blood cultures pending.  Covid positive as of 09/17/2019.  Upon questioning, patient states that his SP tube "fell out" yesterday.  He states he does not understand why this happened.  He reports significant improvement in his abdominal pain since placement of the Foley catheter.  Of note, patient dislodged his SP tube on 09/16/2019 as well.  He previously tolerated indwelling Foley catheterization well before proceeding to SPT placement due to ventral urethral erosion.  Foley catheter in place draining clear, yellow urine with significant urinary sediment today.  Bleeding visualized at the urethral meatus, urethral erosion noted.   Anti-infectives (From admission, onward)   Start     Dose/Rate Route Frequency Ordered Stop   10/03/19 1000  cefTRIAXone (ROCEPHIN) 1 g in sodium chloride 0.9 % 100 mL IVPB     1 g 200 mL/hr over 30 Minutes Intravenous Every 24 hours 10/02/19 0909     10/02/19 0630  cefTRIAXone (ROCEPHIN) 1 g in sodium chloride 0.9 % 100 mL IVPB     1 g 200 mL/hr over 30 Minutes Intravenous  Once 10/02/19 0538 10/02/19 0855   10/02/19 0000  cephALEXin (KEFLEX) 500 MG capsule     500 mg Oral 3 times daily 10/02/19 7858 10/09/19 2359       Past Medical History:  Diagnosis Date  . Anxiety   . Depression   . Diabetes mellitus without complication (HCC)   . Hepatitis C   . Hyperlipidemia   . IBS (irritable bowel syndrome)   . Osteomyelitis (HCC)   . Spinal stenosis     Past Surgical History:  Procedure Laterality Date  . bilateral amputation Bilateral   . CENTRAL LINE INSERTION-TUNNELED N/A 02/02/2019   Procedure: CENTRAL LINE INSERTION-TUNNELED;  Surgeon: Annice Needy, MD;  Location: [facility] INVASIVE CV LAB;  Service: Cardiovascular;  Laterality: N/A;  . SPINAL FUSION    . THORACIC SPINE SURGERY  01/2019   extensive washout    Home Medications:  Current Meds  Medication Sig  . acetaminophen (TYLENOL) 325 MG tablet Take 650 mg by mouth every 4 (four) hours as needed for mild pain or fever.  Marland Kitchen amLODipine (NORVASC) 10 MG tablet Take 10 mg by mouth daily.  . bisacodyl (DULCOLAX) 5 MG EC tablet Take 10 mg by  mouth daily.  Marland Kitchen docusate sodium (COLACE) 100 MG capsule Take 100 mg by mouth 2 (two) times daily.  Marland Kitchen doxycycline (VIBRAMYCIN) 50 MG capsule Take 100 mg by mouth 2 (two) times daily.  . famotidine (PEPCID) 20 MG tablet Take 20 mg by mouth daily.   . ferrous gluconate (FERGON) 324 MG tablet Take 324 mg by mouth 2 (two) times daily with a meal.   . furosemide (LASIX) 40 MG tablet Take 40 mg by mouth 2 (two) times daily.  . hydrALAZINE (APRESOLINE) 50 MG tablet Take 50 mg by mouth 3 (three) times  daily.  . insulin glargine (LANTUS) 100 UNIT/ML injection Inject 20 Units into the skin at bedtime.   . insulin lispro (HUMALOG) 100 UNIT/ML injection Inject 0-15 Units into the skin 4 (four) times daily -  before meals and at bedtime. <150= 0 units 151-200= 3 units 201-250= 6 units 251-300= 9 units 301-350= 12 units 351-400= 15 units >400 call MD  . loratadine (CLARITIN) 10 MG tablet Take 10 mg by mouth daily.  . magnesium hydroxide (MILK OF MAGNESIA) 400 MG/5ML suspension Take 30 mLs by mouth daily as needed for mild constipation.  . Melatonin 5 MG TABS Take 5 mg by mouth at bedtime.  . methocarbamol (ROBAXIN) 500 MG tablet Take 1,000 mg by mouth 4 (four) times daily.   . metoprolol succinate (TOPROL-XL) 25 MG 24 hr tablet Take 25 mg by mouth daily.  . ondansetron (ZOFRAN) 4 MG tablet Take 4 mg by mouth every 6 (six) hours as needed for nausea or vomiting.  Marland Kitchen oxyCODONE 10 MG TABS Take 1 tablet (10 mg total) by mouth every 4 (four) hours as needed for moderate pain or severe pain.  Marland Kitchen oxyCODONE ER 9 MG C12A Take 9 mg by mouth 2 (two) times daily.  . polyethylene glycol (MIRALAX / GLYCOLAX) 17 g packet Take 17 g by mouth daily.  . pregabalin (LYRICA) 75 MG capsule Take 75 mg by mouth 2 (two) times daily.  . sodium phosphate Pediatric (FLEET) 3.5-9.5 GM/59ML enema Place 1 enema rectally every other day as needed for severe constipation.  . traZODone (DESYREL) 50 MG tablet Take 50 mg by mouth at bedtime.     Allergies: No Known Allergies  History reviewed. No pertinent family history.  Social History:  reports that he has been smoking cigarettes. He has been smoking about 0.25 packs per day. He has never used smokeless tobacco. He reports previous alcohol use. He reports previous drug use.  ROS: A complete review of systems was performed.  All systems are negative except for pertinent findings as noted.  Physical Exam:  Vital signs in last 24 hours: Temp:  [99.1 F (37.3 C)-102.4 F  (39.1 C)] 99.1 F (37.3 C) (01/08 1031) Pulse Rate:  [94-119] 94 (01/08 1031) Resp:  [15-20] 15 (01/08 1031) BP: (107-142)/(52-94) 131/70 (01/08 1032) SpO2:  [93 %-98 %] 93 % (01/08 1031) Weight:  [113.4 kg] 113.4 kg (01/08 0528) Constitutional: Ill-appearing, alert, diaphoretic HEENT: [place] AT, moist mucus membranes Cardiovascular: No clubbing, cyanosis, or edema Respiratory: Normal respiratory effort GI: Protuberant abdomen, soft, nondistended, suprapubic tenderness GU: See HPI Skin: No rashes, bruises or suspicious lesions Neurologic: Grossly intact, no focal deficits Psychiatric: Normal mood and affect  Laboratory Data:  Recent Labs    10/02/19 0621  WBC 15.4*  HGB 11.8*  HCT 35.6*   Recent Labs    10/02/19 0621  NA 133*  K 4.7  CL 101  CO2 20*  GLUCOSE  290*  BUN 27*  CREATININE 1.20  CALCIUM 8.7*   Urinalysis    Component Value Date/Time   COLORURINE YELLOW (A) 10/02/2019 0649   APPEARANCEUR CLOUDY (A) 10/02/2019 0649   LABSPEC 1.013 10/02/2019 0649   PHURINE 5.0 10/02/2019 0649   GLUCOSEU 50 (A) 10/02/2019 0649   HGBUR SMALL (A) 10/02/2019 0649   BILIRUBINUR NEGATIVE 10/02/2019 0649   KETONESUR NEGATIVE 10/02/2019 0649   PROTEINUR 30 (A) 10/02/2019 0649   NITRITE POSITIVE (A) 10/02/2019 0649   LEUKOCYTESUR MODERATE (A) 10/02/2019 0649   Results for orders placed or performed during the hospital encounter of 09/17/19  Respiratory Panel by RT PCR (Flu A&B, Covid) - Nasopharyngeal Swab     Status: Abnormal   Collection Time: 09/17/19  8:36 AM   Specimen: Nasopharyngeal Swab  Result Value Ref Range Status   SARS Coronavirus 2 by RT PCR POSITIVE (A) NEGATIVE Final    Comment: CRITICAL RESULT CALLED TO, READ BACK BY AND VERIFIED WITH:  CATHY JARRTTE RN AT  1014 ON 09/17/2019 HASSANR (NOTE) SARS-CoV-2 target nucleic acids are DETECTED. SARS-CoV-2 RNA is generally detectable in upper respiratory specimens  during the acute phase of infection. Positive  results are indicative of the presence of the identified virus, but do not rule out bacterial infection or co-infection with other pathogens not detected by the test. Clinical correlation with patient history and other diagnostic information is necessary to determine patient infection status. The expected result is Negative. Fact Sheet for Patients:  https://www.moore.com/ Fact Sheet for Healthcare Providers: https://www.young.biz/ This test is not yet approved or cleared by the [place] FDA and  has been authorized for detection and/or diagnosis of SARS-CoV-2 by FDA under an Emergency Use Authorization (EUA).  This EUA will remain in effect (meaning th is test can be used) for the duration of  the COVID-19 declaration under Section 564(b)(1) of the Act, 21 U.S.C. section 360bbb-3(b)(1), unless the authorization is terminated or revoked sooner.    Influenza A by PCR NEGATIVE NEGATIVE Final   Influenza B by PCR NEGATIVE NEGATIVE Final    Comment: (NOTE) The Xpert Xpress SARS-CoV-2/FLU/RSV assay is intended as an aid in  the diagnosis of influenza from Nasopharyngeal swab specimens and  should not be used as a sole basis for treatment. Nasal washings and  aspirates are unacceptable for Xpert Xpress SARS-CoV-2/FLU/RSV  testing. Fact Sheet for Patients: https://www.moore.com/ Fact Sheet for Healthcare Providers: https://www.young.biz/ This test is not yet approved or cleared by the [place] FDA and  has been authorized for detection and/or diagnosis of SARS-CoV-2 by  FDA under an Emergency Use Authorization (EUA). This EUA will remain  in effect (meaning this test can be used) for the duration of the  Covid-19 declaration under Section 564(b)(1) of the Act, 21  U.S.C. section 360bbb-3(b)(1), unless the authorization is  terminated or revoked. Performed at [facility], [address] [place], [place] [address]    Assessment & Plan:  59 year old comorbid male presents to the ED with acute urinary retention in the setting of a dislodged SP tube with difficult Foley placement.  Foley catheter has been replaced at this time with immediate efflux of 1500 ml cloudy to purulent urine.  Foley catheter continues to drain at the time of this consult.  SP tube has dislodged twice since initial placement less than 1 month ago.  Given patient's apparent intolerance for SP tube, recommend continued indwelling Foley catheterization.  Agree with culture appropriate antibiotics.  Recommend daily BMP  and CBC to trend creatinine and white count.  Thank you for involving me in this patient's care, please page with any further questions or concerns.  Carman Ching, PA-C 10/02/2019 12:18 PM

## 2019-10-02 NOTE — H&P (Addendum)
History and Physical:    Eugene [name]   BWI:[id number] DOB: 08/28/1961 DOA: 10/02/2019  Referring MD/provider: Nita Sickle, MD PCP: System, Pcp Not In   Patient coming from: Nursing home  Chief Complaint: Bloody urine and urinary retention  History of Present Illness:   Eugene [name] is an 59 y.o. male with medical history significant for hepatitis C, osteomyelitis, spinal stenosis, IBS, hyperlipidemia, diabetes mellitus, anxiety, depression, bilateral below-knee amputation, chronic urinary retention with indwelling suprapubic catheter, who was brought to the hospital because of bloody urine.  Patient said he had Foley catheter for many years and was switched to suprapubic catheter 2 months ago.  Apparently, suprapubic catheter was dislodged at the nursing home yesterday.  Attempt was made to place a Foley catheter but apparently was unsuccessful.  Patient said he started bleeding from the penis.  According to ED physician, there was also pus coming from the penis.  Patient said he had a lot of suprapubic pain at that time but pain has improved since he got the Foley catheter placed in the emergency room.  He said he did not know he had a fever.  He has no other complaints.  He tested positive for coronavirus on September 17, 2019 but he said a repeat test was negative about 3 days ago. ED nurse, Maralyn Sago, verified with the nursing home and apparently, he got tested on 09/30/2019 and results are still pending.  No cough, shortness of breath, chest pain, vomiting, diarrhea, headache, dizziness.  ED Course:  The patient was febrile with temperature of 102.4, tachycardic with heart rate of 116, WBC was elevated at 15.4 consistent with sepsis.  Urinalysis was positive for leukocytes and bacteria but chest x-ray was unremarkable.  Lactic acid was normal.  He was given IV Rocephin, IV fluids, oxycodone and Tylenol in the emergency room.    ROS:   ROS all other systems reviewed were  negative  Past Medical History:   Past Medical History:  Diagnosis Date  . Anxiety   . Depression   . Diabetes mellitus without complication (HCC)   . Hepatitis C   . Hyperlipidemia   . IBS (irritable bowel syndrome)   . Osteomyelitis (HCC)   . Spinal stenosis     Past Surgical History:   Past Surgical History:  Procedure Laterality Date  . bilateral amputation Bilateral   . CENTRAL LINE INSERTION-TUNNELED N/A 02/02/2019   Procedure: CENTRAL LINE INSERTION-TUNNELED;  Surgeon: Annice Needy, MD;  Location: [facility] INVASIVE CV LAB;  Service: Cardiovascular;  Laterality: N/A;  . SPINAL FUSION    . THORACIC SPINE SURGERY  01/2019   extensive washout    Social History:   Social History   Socioeconomic History  . Marital status: Single    Spouse name: Not on file  . Number of children: Not on file  . Years of education: Not on file  . Highest education level: Not on file  Occupational History  . Not on file  Tobacco Use  . Smoking status: Current Every Day Smoker    Packs/day: 0.25    Types: Cigarettes  . Smokeless tobacco: Never Used  Substance and Sexual Activity  . Alcohol use: Not Currently  . Drug use: Not Currently  . Sexual activity: Not Currently  Other Topics Concern  . Not on file  Social History Narrative   Resides at [facility]   Social Determinants of Health   Financial Resource Strain: Medium Risk  . Difficulty of Paying Living Expenses: Somewhat  hard  Food Insecurity: Food Insecurity Present  . Worried About Programme researcher, broadcasting/film/video in the Last Year: Sometimes true  . Ran Out of Food in the Last Year: Sometimes true  Transportation Needs: No Transportation Needs  . Lack of Transportation (Medical): No  . Lack of Transportation (Non-Medical): No  Physical Activity: Unknown  . Days of Exercise per Week: 0 days  . Minutes of Exercise per Session: Not on file  Stress: Stress Concern Present  . Feeling of Stress : To some extent  Social  Connections: Somewhat Isolated  . Frequency of Communication with Friends and Family: More than three times a week  . Frequency of Social Gatherings with Friends and Family: More than three times a week  . Attends Religious Services: More than 4 times per year  . Active Member of Clubs or Organizations: Not on file  . Attends Banker Meetings: Never  . Marital Status: Never married  Intimate Partner Violence: Unknown  . Fear of Current or Ex-Partner: Patient refused  . Emotionally Abused: Patient refused  . Physically Abused: Patient refused  . Sexually Abused: Patient refused    Allergies   Patient has no known allergies.  Family history:   History reviewed. No pertinent family history.  Current Medications:   Prior to Admission medications   Medication Sig Start Date End Date Taking? Authorizing Provider  acetaminophen (TYLENOL) 325 MG tablet Take 650 mg by mouth every 4 (four) hours as needed for mild pain or fever.   Yes [provider]  amLODipine (NORVASC) 10 MG tablet Take 10 mg by mouth daily.   Yes [provider]  bisacodyl (DULCOLAX) 5 MG EC tablet Take 10 mg by mouth daily.   Yes [provider]  docusate sodium (COLACE) 100 MG capsule Take 100 mg by mouth 2 (two) times daily.   Yes [provider]  doxycycline (VIBRAMYCIN) 50 MG capsule Take 100 mg by mouth 2 (two) times daily.   Yes [provider]  famotidine (PEPCID) 20 MG tablet Take 20 mg by mouth daily.    Yes [provider]  ferrous gluconate (FERGON) 324 MG tablet Take 324 mg by mouth 2 (two) times daily with a meal.    Yes [provider]  furosemide (LASIX) 40 MG tablet Take 40 mg by mouth 2 (two) times daily.   Yes [provider]  hydrALAZINE (APRESOLINE) 50 MG tablet Take 50 mg by mouth 3 (three) times daily.   Yes [provider]  insulin glargine (LANTUS) 100 UNIT/ML injection Inject 20 Units into the skin at  bedtime.    Yes [provider]  insulin lispro (HUMALOG) 100 UNIT/ML injection Inject 0-15 Units into the skin 4 (four) times daily -  before meals and at bedtime. <150= 0 units 151-200= 3 units 201-250= 6 units 251-300= 9 units 301-350= 12 units 351-400= 15 units >400 call MD   Yes [provider]  loratadine (CLARITIN) 10 MG tablet Take 10 mg by mouth daily.   Yes [provider]  magnesium hydroxide (MILK OF MAGNESIA) 400 MG/5ML suspension Take 30 mLs by mouth daily as needed for mild constipation. 03/04/19  Yes Katha Hamming, MD  Melatonin 5 MG TABS Take 5 mg by mouth at bedtime.   Yes [provider]  methocarbamol (ROBAXIN) 500 MG tablet Take 1,000 mg by mouth 4 (four) times daily.    Yes [provider]  metoprolol succinate (TOPROL-XL) 25 MG 24 hr tablet Take  25 mg by mouth daily.   Yes [provider]  ondansetron (ZOFRAN) 4 MG tablet Take 4 mg by mouth every 6 (six) hours as needed for nausea or vomiting.   Yes [provider]  oxyCODONE 10 MG TABS Take 1 tablet (10 mg total) by mouth every 4 (four) hours as needed for moderate pain or severe pain. 03/04/19  Yes Epifanio Lesches, MD  oxyCODONE ER 9 MG C12A Take 9 mg by mouth 2 (two) times daily.   Yes [provider]  polyethylene glycol (MIRALAX / GLYCOLAX) 17 g packet Take 17 g by mouth daily.   Yes [provider]  pregabalin (LYRICA) 75 MG capsule Take 75 mg by mouth 2 (two) times daily.   Yes [provider]  sodium phosphate Pediatric (FLEET) 3.5-9.5 GM/59ML enema Place 1 enema rectally every other day as needed for severe constipation.   Yes [provider]  traZODone (DESYREL) 50 MG tablet Take 50 mg by mouth at bedtime.    Yes [provider]  cephALEXin (KEFLEX) 500 MG capsule Take 1 capsule (500 mg total) by mouth 3 (three) times daily for 7 days. 10/02/19 10/09/19  Rudene Re, MD  lisinopril (ZESTRIL)  10 MG tablet Take 10 mg by mouth daily.     [provider]    Physical Exam:   Vitals:   10/02/19 0523 10/02/19 0528 10/02/19 0530 10/02/19 0644  BP: (!) 121/94  (!) 142/52 (!) 107/58  Pulse: (!) 119  (!) 116 (!) 106  Resp: 20     Temp: (!) 102.4 F (39.1 C)   (!) 101 F (38.3 C)  TempSrc: Oral   Oral  SpO2: 98%  96% 94%  Weight:  113.4 kg    Height:  6' (1.829 m)       Physical Exam: Blood pressure (!) 107/58, pulse (!) 106, temperature (!) 101 F (38.3 C), temperature source Oral, resp. rate 20, height 6' (1.829 m), weight 113.4 kg, SpO2 94 %. Gen: No acute distress. Head: Normocephalic, atraumatic. Eyes: Pupils equal, round and reactive to light. Extraocular movements intact.  Sclerae nonicteric.  Mouth: Moist mucous membranes Neck: Supple, no thyromegaly, no lymphadenopathy, no jugular venous distention. Chest: Lungs are clear to auscultation with good air movement. No rales, rhonchi or wheezes.  CV: Heart sounds are regular with an S1, S2. No murmurs, rubs or gallops.  Abdomen: Soft, nontender, nondistended with normal active bowel sounds. No palpable masses. Extremities: Bilateral BKA Skin: Warm and dry. No rashes Neuro: Alert and oriented times 3; grossly nonfocal.  Psych: Insight is good and judgment is appropriate. Mood and affect normal.   Data Review:    Labs: Basic Metabolic Panel: Recent Labs  Lab 10/02/19 0621  NA 133*  K 4.7  CL 101  CO2 20*  GLUCOSE 290*  BUN 27*  CREATININE 1.20  CALCIUM 8.7*   Liver Function Tests: No results for input(s): AST, ALT, ALKPHOS, BILITOT, PROT, ALBUMIN in the last 168 hours. No results for input(s): LIPASE, AMYLASE in the last 168 hours. No results for input(s): AMMONIA in the last 168 hours. CBC: Recent Labs  Lab 10/02/19 0621  WBC 15.4*  NEUTROABS 14.0*  HGB 11.8*  HCT 35.6*  MCV 92.0  PLT 262   Cardiac Enzymes: No results for input(s): CKTOTAL, CKMB, CKMBINDEX, TROPONINI in the last 168  hours.  BNP (last 3 results) No results for input(s): PROBNP in the last 8760 hours. CBG: No results for input(s): GLUCAP in the  last 168 hours.  Urinalysis    Component Value Date/Time   COLORURINE YELLOW (A) 10/02/2019 0649   APPEARANCEUR CLOUDY (A) 10/02/2019 0649   LABSPEC 1.013 10/02/2019 0649   PHURINE 5.0 10/02/2019 0649   GLUCOSEU 50 (A) 10/02/2019 0649   HGBUR SMALL (A) 10/02/2019 0649   BILIRUBINUR NEGATIVE 10/02/2019 0649   KETONESUR NEGATIVE 10/02/2019 0649   PROTEINUR 30 (A) 10/02/2019 0649   NITRITE POSITIVE (A) 10/02/2019 0649   LEUKOCYTESUR MODERATE (A) 10/02/2019 0649      Radiographic Studies: DG Chest Port 1 View  Result Date: 10/02/2019 CLINICAL DATA:  Sepsis and a COVID-19 positive patient. EXAM: PORTABLE CHEST 1 VIEW COMPARISON:  Single-view of the chest 02/09/2019. FINDINGS: The lungs are clear. Heart size is normal. No pneumothorax or pleural effusion. Postoperative change thoracic fusion and lower thoracic corpectomy noted. No acute bony abnormality IMPRESSION: No acute disease. Electronically Signed   By: Drusilla Kanner M.D.   On: 10/02/2019 07:18    EKG: Independently reviewed.  Sinus tachycardia   Assessment/Plan:   Principal Problem:   Sepsis (HCC) Active Problems:   Complicated UTI (urinary tract infection)   Peripheral vascular disease (HCC)    Body mass index is 33.91 kg/m.  (Obesity)  Sepsis secondary to complicated UTI in a patient with chronic suprapubic catheter: Admit to telemetry.  Patient received IV Rocephin and IV fluids in the emergency room.  Continue IV Rocephin.  Follow-up urine and blood cultures.  Type 2 diabetes mellitus with hyperglycemia: Continue Lantus.  NovoLog as needed for hyperglycemia  Hyponatremia: Patient received IV fluids in the emergency room.  Hypertension: Resume antihypertensives tomorrow  Acute on chronic urinary retention/hematuria: Consulted urologist, Dr. Lonna Cobb, for further evaluation and  he recommended placement of suprapubic catheter by interventional radiologist.  This has been ordered.    Chronic back and lower extremity pain: Continue analgesics.   Other information:   DVT prophylaxis: Lovenox Code Status: Full code. Family Communication: Plan discussed with patient Disposition Plan: For discharge to nursing home in 2 to 3 days Consults called: Urologist Admission status: Inpatient    The medical decision making is of high complexity, therefore this is a level 3 visit.  Time spent 65 minutes  Dreyah Montrose [facility]   How to contact the [facility] Attending or Consulting provider 7A - 7P or covering provider during after hours 7P -7A, for this patient?   1. Check the care team in [facility] and look for a) attending/consulting [facility] provider listed and b) the [facility] team listed 2. Log into [email or web address] and use [facility] universal password to access. If you do not have the password, please contact the hospital operator. 3. Locate the [facility] provider you are looking for under [facility] and page to a number that you can be directly reached. 4. If you still have difficulty reaching the provider, please page the [facility] (Director on Call) for the Hospitalists listed on amion for assistance.  10/02/2019, 9:23 AM

## 2019-10-02 NOTE — ED Notes (Signed)
Unable to get second set of blood cultures. Pt has been stuck multiple times by RNs and IV tea.m. Lab is busy at this time. Antibiotics to be started instead of waiting for lab. MD aware.

## 2019-10-02 NOTE — ED Notes (Signed)
Pt states suprapubic catheter came out yesterday around 1600 and staff at [facility] attempted to insert catheter into penis several times without success. Pt states he started having mod amount of bleeding noted from penis. Pt in co abd pain and distention.

## 2019-10-02 NOTE — ED Notes (Signed)
Pt had repeat test done at [facility] on 09/30/19 and test results are not back yet. MD notified.

## 2019-10-03 LAB — GLUCOSE, CAPILLARY
Glucose-Capillary: 230 mg/dL — ABNORMAL HIGH (ref 70–99)
Glucose-Capillary: 233 mg/dL — ABNORMAL HIGH (ref 70–99)
Glucose-Capillary: 190 mg/dL — ABNORMAL HIGH (ref 70–99)
Glucose-Capillary: 177 mg/dL — ABNORMAL HIGH (ref 70–99)

## 2019-10-03 LAB — MRSA PCR SCREENING: MRSA by PCR: NEGATIVE

## 2019-10-03 MED ORDER — DIPHENHYDRAMINE HCL 25 MG PO CAPS
25.0000 mg | ORAL_CAPSULE | Freq: Four times a day (QID) | ORAL | Status: DC | PRN
  Administered 2019-10-03 – 2019-10-07 (×9): 25 mg via ORAL
  Filled 2019-10-03 (×9): qty 1

## 2019-10-03 NOTE — Progress Notes (Addendum)
PROGRESS NOTE    Eugene [name]  CHY:[id number] DOB: [birth year] DOA: 10/02/2019 PCP: System, Pcp Not In    Assessment & Plan:   Principal Problem:   Sepsis (HCC) Active Problems:   Complicated UTI (urinary tract infection)   Eugene [name] is an 59 y.o. Caucasian male with medical history significant for hepatitis C, osteomyelitis, spinal stenosis, IBS, hyperlipidemia, diabetes mellitus, anxiety, depression, bilateral below-knee amputation, chronic urinary retention with indwelling suprapubic catheter, who was brought to the hospital because of bloody urine.  ED Course:  The patient was febrile with temperature of 102.4, tachycardic with heart rate of 116, WBC was elevated at 15.4.  Urinalysis was positive for leukocytes and bacteria but chest x-ray was unremarkable.  Lactic acid was normal.  He was given IV Rocephin, IV fluids, oxycodone and Tylenol in the emergency room.   Complicated UTI and hematuria in a patient with chronic suprapubic catheter, POA --102.4, tachycardic with heart rate of 116, WBC was elevated at 15.4.  Urinalysis was positive for leukocytes and bacteria.  Lactic acid was normal.  He was given IV Rocephin.  Urine cx wasn't ordered on admission.   PLAN: --continue Rocephin --Urine cx ordered today using pre-existing urine sample  Chronic urinary retention with indwelling suprapubic catheter --SP tube has dislodged twice since initial placement less than 1 month ago, so urology and radiology had elected not to re-insert SP cath and placed Foley cath instead on admission. --Pt wants to have SP cath re-inserted, so will contact urology for this.  # Recent COVID-19 infection --tested positive for coronavirus on September 17, 2019  --Currently asymptomatic  Type 2 diabetes mellitus with hyperglycemia --Continue Lantus 20u daily.  NovoLog as needed for hyperglycemia  Hyponatremia --s/p IV fluids in the emergency room.  Hypertension:  --continue home amlodipine,  lasix, hydralazine and metop   Chronic back and lower extremity pain:  Continue home opioids, Lyrica and Robaxin   DVT prophylaxis: Lovenox SQ Code Status: Full code  Disposition Plan: Back to SNF   Subjective and Interval History:  Pt complained of back pain that's chronic.  Abdominal pain resolved.  No fever, dyspnea, chest pain, N/V/D.  Pt wants his suprapubic cath put back, because he said the Foley cath is tearing up his penis.   Objective: Vitals:   10/02/19 2028 10/03/19 0452 10/03/19 0752 10/03/19 1524  BP: 119/66 129/69 (!) 135/112 118/68  Pulse: 73 79 79 68  Resp:   20 20  Temp: 98.3 F (36.8 C) 99.2 F (37.3 C) 99.3 F (37.4 C) 98.9 F (37.2 C)  TempSrc: Oral Oral Oral Oral  SpO2: 99% 96% 97% 96%  Weight: 112.1 kg     Height:        Intake/Output Summary (Last 24 hours) at 10/03/2019 1909 Last data filed at 10/03/2019 1443 Gross per 24 hour  Intake 314.81 ml  Output 1900 ml  Net -1585.19 ml   Filed Weights   10/02/19 0528 10/02/19 2028  Weight: 113.4 kg 112.1 kg    Examination:   Constitutional: NAD, AAOx3 HEENT: conjunctivae and lids normal, EOMI CV: RRR no M,R,G. Distal pulses +2.  No cyanosis.   RESP: CTA B/L, normal respiratory effort, on RA GI: +BS, NTND SKIN: warm, dry and intact Neuro: II - XII grossly intact.  Sensation intact   Data Reviewed: I have personally reviewed following labs and imaging studies  CBC: Recent Labs  Lab 10/02/19 0621  WBC 15.4*  NEUTROABS 14.0*  HGB 11.8*  HCT 35.6*  MCV  92.0  PLT 573   Basic Metabolic Panel: Recent Labs  Lab 10/02/19 0621  NA 133*  K 4.7  CL 101  CO2 20*  GLUCOSE 290*  BUN 27*  CREATININE 1.20  CALCIUM 8.7*   GFR: Estimated Creatinine Clearance: 88.9 mL/min (by C-G formula based on SCr of 1.2 mg/dL). Liver Function Tests: No results for input(s): AST, ALT, ALKPHOS, BILITOT, PROT, ALBUMIN in the last 168 hours. No results for input(s): LIPASE, AMYLASE in the last 168  hours. No results for input(s): AMMONIA in the last 168 hours. Coagulation Profile: No results for input(s): INR, PROTIME in the last 168 hours. Cardiac Enzymes: No results for input(s): CKTOTAL, CKMB, CKMBINDEX, TROPONINI in the last 168 hours. BNP (last 3 results) No results for input(s): PROBNP in the last 8760 hours. HbA1C: No results for input(s): HGBA1C in the last 72 hours. CBG: Recent Labs  Lab 10/02/19 2056 10/02/19 2344 10/03/19 0754 10/03/19 1205 10/03/19 1715  GLUCAP 193* 280* 230* 233* 190*   Lipid Profile: No results for input(s): CHOL, HDL, LDLCALC, TRIG, CHOLHDL, LDLDIRECT in the last 72 hours. Thyroid Function Tests: No results for input(s): TSH, T4TOTAL, FREET4, T3FREE, THYROIDAB in the last 72 hours. Anemia Panel: No results for input(s): VITAMINB12, FOLATE, FERRITIN, TIBC, IRON, RETICCTPCT in the last 72 hours. Sepsis Labs: Recent Labs  Lab 10/02/19 0755  LATICACIDVEN 1.3    Recent Results (from the past 240 hour(s))  Blood culture (routine x 2)     Status: None (Preliminary result)   Collection Time: 10/02/19  7:55 AM   Specimen: BLOOD  Result Value Ref Range Status   Specimen Description BLOOD RFA  Final   Special Requests   Final    BOTTLES DRAWN AEROBIC AND ANAEROBIC Blood Culture adequate volume   Culture   Final    NO GROWTH < 24 HOURS Performed at [facility], [address], [place], [place] [address]    Report Status PENDING  Incomplete  MRSA PCR Screening     Status: None   Collection Time: 10/03/19  5:33 AM   Specimen: Nasal Mucosa; Nasopharyngeal  Result Value Ref Range Status   MRSA by PCR NEGATIVE NEGATIVE Final    Comment:        The GeneXpert MRSA Assay (FDA approved for NASAL specimens only), is one component of a comprehensive MRSA colonization surveillance program. It is not intended to diagnose MRSA infection nor to guide or monitor treatment for MRSA infections. Performed at [facility], [address] [place], [place] [address]       Radiology Studies: [facility] Chest Cable 1 View  Result Date: 10/02/2019 CLINICAL DATA:  Sepsis and a COVID-19 positive patient. EXAM: PORTABLE CHEST 1 VIEW COMPARISON:  Single-view of the chest 02/09/2019. FINDINGS: The lungs are clear. Heart size is normal. No pneumothorax or pleural effusion. Postoperative change thoracic fusion and lower thoracic corpectomy noted. No acute bony abnormality IMPRESSION: No acute disease. Electronically Signed   By: Inge Rise M.D.   On: 10/02/2019 07:18     Scheduled Meds: . amLODipine  10 mg Oral Daily  . bisacodyl  10 mg Oral Daily  . Chlorhexidine Gluconate Cloth  6 each Topical Daily  . docusate sodium  100 mg Oral BID  . enoxaparin (LOVENOX) injection  40 mg Subcutaneous Q24H  . famotidine  20 mg Oral Daily  . ferrous gluconate  324 mg Oral Q breakfast  . furosemide  40 mg Oral BID  . hydrALAZINE  50 mg Oral TID  . influenza vac split quadrivalent PF  0.5 mL Intramuscular Tomorrow-1000  . insulin aspart  0-15 Units Subcutaneous TID WC  . insulin glargine  20 Units Subcutaneous QHS  . loratadine  10 mg Oral Daily  . Melatonin  5 mg Oral QHS  . methocarbamol  1,000 mg Oral QID  . metoprolol succinate  25 mg Oral Daily  . nicotine  21 mg Transdermal Daily  . oxyCODONE  10 mg Oral BID  . pneumococcal 23 valent vaccine  0.5 mL Intramuscular Tomorrow-1000  . polyethylene glycol  17 g Oral Daily  . pregabalin  75 mg Oral BID  . traZODone  50 mg Oral QHS   Continuous Infusions: . cefTRIAXone (ROCEPHIN)  IV Stopped (10/03/19 1001)     LOS: 1 day     Darlin Priestly, MD [facility] If 7PM-7AM, please contact night-coverage 10/03/2019, 7:09 PM

## 2019-10-03 NOTE — Progress Notes (Signed)
Patient refuse to wear telemetry monitor. NP Blount notified. No new orders.  Eugene [name] M

## 2019-10-03 NOTE — Plan of Care (Signed)
  Problem: Clinical Measurements: Goal: Ability to maintain clinical measurements within normal limits will improve Outcome: Progressing Goal: Diagnostic test results will improve Outcome: Progressing Goal: Respiratory complications will improve Outcome: Progressing   Problem: Education: Goal: Knowledge of risk factors and measures for prevention of condition will improve Outcome: Progressing   Problem: Coping: Goal: Psychosocial and spiritual needs will be supported Outcome: Progressing   Problem: Respiratory: Goal: Will maintain a patent airway Outcome: Progressing Goal: Complications related to the disease process, condition or treatment will be avoided or minimized Outcome: Progressing

## 2019-10-03 NOTE — Plan of Care (Signed)
  Problem: Education: Goal: Knowledge of General Education information will improve Description: Including pain rating scale, medication(s)/side effects and non-pharmacologic comfort measures Outcome: Progressing   Problem: Clinical Measurements: Goal: Ability to maintain clinical measurements within normal limits will improve Outcome: Progressing Goal: Diagnostic test results will improve Outcome: Progressing Goal: Respiratory complications will improve Outcome: Progressing   Problem: Elimination: Goal: Will not experience complications related to urinary retention Outcome: Progressing   Problem: Pain Managment: Goal: General experience of comfort will improve Outcome: Progressing   Problem: Safety: Goal: Ability to remain free from injury will improve Outcome: Progressing   Problem: Skin Integrity: Goal: Risk for impaired skin integrity will decrease Outcome: Progressing   Problem: Education: Goal: Knowledge of risk factors and measures for prevention of condition will improve Outcome: Progressing   Problem: Respiratory: Goal: Will maintain a patent airway Outcome: Progressing Goal: Complications related to the disease process, condition or treatment will be avoided or minimized Outcome: Progressing

## 2019-10-04 LAB — GLUCOSE, CAPILLARY
Glucose-Capillary: 230 mg/dL — ABNORMAL HIGH (ref 70–99)
Glucose-Capillary: 272 mg/dL — ABNORMAL HIGH (ref 70–99)
Glucose-Capillary: 133 mg/dL — ABNORMAL HIGH (ref 70–99)
Glucose-Capillary: 215 mg/dL — ABNORMAL HIGH (ref 70–99)

## 2019-10-04 LAB — CBC
HCT: 31.3 % — ABNORMAL LOW (ref 39.0–52.0)
Hemoglobin: 10.2 g/dL — ABNORMAL LOW (ref 13.0–17.0)
MCH: 30.4 pg (ref 26.0–34.0)
MCHC: 32.6 g/dL (ref 30.0–36.0)
MCV: 93.2 fL (ref 80.0–100.0)
Platelets: 194 10*3/uL (ref 150–400)
RBC: 3.36 MIL/uL — ABNORMAL LOW (ref 4.22–5.81)
RDW: 13 % (ref 11.5–15.5)
WBC: 4.7 10*3/uL (ref 4.0–10.5)
nRBC: 0 % (ref 0.0–0.2)

## 2019-10-04 LAB — BASIC METABOLIC PANEL
Anion gap: 10 (ref 5–15)
BUN: 26 mg/dL — ABNORMAL HIGH (ref 6–20)
CO2: 24 mmol/L (ref 22–32)
Calcium: 8.5 mg/dL — ABNORMAL LOW (ref 8.9–10.3)
Chloride: 100 mmol/L (ref 98–111)
Creatinine, Ser: 1.19 mg/dL (ref 0.61–1.24)
GFR calc Af Amer: >60 mL/min (ref >60)
GFR calc non Af Amer: >60 mL/min (ref >60)
Glucose, Bld: 224 mg/dL — ABNORMAL HIGH (ref 70–99)
Potassium: 4.8 mmol/L (ref 3.5–5.1)
Sodium: 134 mmol/L — ABNORMAL LOW (ref 135–145)

## 2019-10-04 LAB — MAGNESIUM: Magnesium: 2 mg/dL (ref 1.7–2.4)

## 2019-10-04 NOTE — Progress Notes (Signed)
PROGRESS NOTE    Eugene [name]  IHK:[id number] DOB: 1961-09-07 DOA: 10/02/2019 PCP: System, Pcp Not In    Assessment & Plan:   Principal Problem:   Sepsis (HCC) Active Problems:   Complicated UTI (urinary tract infection)   Eugene [name] is an 59 y.o. Caucasian male with medical history significant for hepatitis C, osteomyelitis, spinal stenosis, IBS, hyperlipidemia, diabetes mellitus, anxiety, depression, bilateral below-knee amputation, chronic urinary retention with indwelling suprapubic catheter, who was brought to the hospital because of bloody urine.  ED Course:  The patient was febrile with temperature of 102.4, tachycardic with heart rate of 116, WBC was elevated at 15.4.  Urinalysis was positive for leukocytes and bacteria but chest x-ray was unremarkable.  Lactic acid was normal.  He was given IV Rocephin, IV fluids, oxycodone and Tylenol in the emergency room.   Complicated UTI and hematuria in a patient with chronic suprapubic catheter, POA --102.4, tachycardic with heart rate of 116, WBC was elevated at 15.4.  Urinalysis was positive for leukocytes and bacteria.  Lactic acid was normal.  He was given IV Rocephin.  Urine cx wasn't ordered on admission, subsequently ordered using pre-existing urine sample. PLAN: --continue Rocephin pending urine cx results  Chronic urinary retention with indwelling suprapubic catheter --SP tube has dislodged twice since initial placement less than 1 month ago, so urology and radiology had elected not to re-insert SP cath and placed Foley cath instead on admission. --Pt wants to have SP cath re-inserted, so will consult IR on Monday for this.  # Recent COVID-19 infection --tested positive for coronavirus on September 17, 2019  --Currently asymptomatic  Type 2 diabetes mellitus with hyperglycemia --Continue Lantus 20u daily.  NovoLog as needed for hyperglycemia  Hyponatremia --s/p IV fluids in the emergency room.  Hypertension:   --continue home amlodipine, lasix, hydralazine and metop   Chronic back and lower extremity pain:  Continue home opioids, Lyrica and Robaxin   DVT prophylaxis: Lovenox SQ Code Status: Full code  Disposition Plan: Back to SNF    Subjective and Interval History:  Same chronic pain, pt had no new complaints.  No fever, dyspnea, chest pain, abdominal pain, N/V/D.  Normal PO intake.   Objective: Vitals:   10/04/19 0417 10/04/19 0840 10/04/19 0955 10/04/19 1642  BP: 125/63 110/61  112/61  Pulse: 60 (!) 59 63 73  Resp: 19 18  20   Temp: 98.9 F (37.2 C) 98.3 F (36.8 C)  98.1 F (36.7 C)  TempSrc:  Oral  Oral  SpO2: 96% 97%  97%  Weight:      Height:        Intake/Output Summary (Last 24 hours) at 10/04/2019 1815 Last data filed at 10/04/2019 0800 Gross per 24 hour  Intake -  Output 3750 ml  Net -3750 ml   Filed Weights   10/02/19 0528 10/02/19 2028  Weight: 113.4 kg 112.1 kg    Examination:   Constitutional: NAD, AAOx3 HEENT: conjunctivae and lids normal, EOMI CV: RRR no M,R,G. Distal pulses +2.  No cyanosis.   RESP: CTA B/L, normal respiratory effort, on RA GI: +BS, NTND Extremities:  BLE BKA SKIN: warm, dry and intact Neuro: II - XII grossly intact.  Sensation intact   Data Reviewed: I have personally reviewed following labs and imaging studies  CBC: Recent Labs  Lab 10/02/19 0621 10/04/19 0423  WBC 15.4* 4.7  NEUTROABS 14.0*  --   HGB 11.8* 10.2*  HCT 35.6* 31.3*  MCV 92.0 93.2  PLT 262 194  Basic Metabolic Panel: Recent Labs  Lab 10/02/19 0621 10/04/19 0423  NA 133* 134*  K 4.7 4.8  CL 101 100  CO2 20* 24  GLUCOSE 290* 224*  BUN 27* 26*  CREATININE 1.20 1.19  CALCIUM 8.7* 8.5*  MG  --  2.0   GFR: Estimated Creatinine Clearance: 89.6 mL/min (by C-G formula based on SCr of 1.19 mg/dL). Liver Function Tests: No results for input(s): AST, ALT, ALKPHOS, BILITOT, PROT, ALBUMIN in the last 168 hours. No results for input(s): LIPASE,  AMYLASE in the last 168 hours. No results for input(s): AMMONIA in the last 168 hours. Coagulation Profile: No results for input(s): INR, PROTIME in the last 168 hours. Cardiac Enzymes: No results for input(s): CKTOTAL, CKMB, CKMBINDEX, TROPONINI in the last 168 hours. BNP (last 3 results) No results for input(s): PROBNP in the last 8760 hours. HbA1C: No results for input(s): HGBA1C in the last 72 hours. CBG: Recent Labs  Lab 10/03/19 1715 10/03/19 1947 10/04/19 0914 10/04/19 1139 10/04/19 1704  GLUCAP 190* 177* 230* 272* 133*   Lipid Profile: No results for input(s): CHOL, HDL, LDLCALC, TRIG, CHOLHDL, LDLDIRECT in the last 72 hours. Thyroid Function Tests: No results for input(s): TSH, T4TOTAL, FREET4, T3FREE, THYROIDAB in the last 72 hours. Anemia Panel: No results for input(s): VITAMINB12, FOLATE, FERRITIN, TIBC, IRON, RETICCTPCT in the last 72 hours. Sepsis Labs: Recent Labs  Lab 10/02/19 0755  LATICACIDVEN 1.3    Recent Results (from the past 240 hour(s))  Blood culture (routine x 2)     Status: None (Preliminary result)   Collection Time: 10/02/19  7:55 AM   Specimen: BLOOD  Result Value Ref Range Status   Specimen Description BLOOD RFA  Final   Special Requests   Final    BOTTLES DRAWN AEROBIC AND ANAEROBIC Blood Culture adequate volume   Culture   Final    NO GROWTH 2 DAYS Performed at [facility], [address], [place], [place] [address]    Report Status PENDING  Incomplete  MRSA PCR Screening     Status: None   Collection Time: 10/03/19  5:33 AM   Specimen: Nasal Mucosa; Nasopharyngeal  Result Value Ref Range Status   MRSA by PCR NEGATIVE NEGATIVE Final    Comment:        The GeneXpert MRSA Assay (FDA approved for NASAL specimens only), is one component of a comprehensive MRSA colonization surveillance program. It is not intended to diagnose MRSA infection nor to guide or monitor treatment for MRSA infections. Performed at [facility], [address], [place], [place] [address]       Radiology Studies: No results found.   Scheduled Meds: . amLODipine  10 mg Oral Daily  . bisacodyl  10 mg Oral Daily  . Chlorhexidine Gluconate Cloth  6 each Topical Daily  . docusate sodium  100 mg Oral BID  . enoxaparin (LOVENOX) injection  40 mg Subcutaneous Q24H  . famotidine  20 mg Oral Daily  . ferrous gluconate  324 mg Oral Q breakfast  . furosemide  40 mg Oral BID  . hydrALAZINE  50 mg Oral TID  . influenza vac split quadrivalent PF  0.5 mL Intramuscular Tomorrow-1000  . insulin aspart  0-15 Units Subcutaneous TID WC  . insulin glargine  20 Units Subcutaneous QHS  . loratadine  10 mg Oral Daily  . Melatonin  5 mg Oral QHS  . methocarbamol  1,000 mg Oral QID  . metoprolol succinate  25  mg Oral Daily  . nicotine  21 mg Transdermal Daily  . oxyCODONE  10 mg Oral BID  . pneumococcal 23 valent vaccine  0.5 mL Intramuscular Tomorrow-1000  . polyethylene glycol  17 g Oral Daily  . pregabalin  75 mg Oral BID  . traZODone  50 mg Oral QHS   Continuous Infusions: . cefTRIAXone (ROCEPHIN)  IV 1 g (10/04/19 0957)     LOS: 2 days     Darlin Priestly, MD [facility] If 7PM-7AM, please contact night-coverage 10/04/2019, 6:15 PM

## 2019-10-05 LAB — BASIC METABOLIC PANEL
Anion gap: 11 (ref 5–15)
BUN: 25 mg/dL — ABNORMAL HIGH (ref 6–20)
CO2: 24 mmol/L (ref 22–32)
Calcium: 8.4 mg/dL — ABNORMAL LOW (ref 8.9–10.3)
Chloride: 99 mmol/L (ref 98–111)
Creatinine, Ser: 1.21 mg/dL (ref 0.61–1.24)
GFR calc Af Amer: >60 mL/min (ref >60)
GFR calc non Af Amer: >60 mL/min (ref >60)
Glucose, Bld: 233 mg/dL — ABNORMAL HIGH (ref 70–99)
Potassium: 4.1 mmol/L (ref 3.5–5.1)
Sodium: 134 mmol/L — ABNORMAL LOW (ref 135–145)

## 2019-10-05 LAB — CBC
HCT: 32.4 % — ABNORMAL LOW (ref 39.0–52.0)
Hemoglobin: 10.7 g/dL — ABNORMAL LOW (ref 13.0–17.0)
MCH: 29.7 pg (ref 26.0–34.0)
MCHC: 33 g/dL (ref 30.0–36.0)
MCV: 90 fL (ref 80.0–100.0)
Platelets: 202 10*3/uL (ref 150–400)
RBC: 3.6 MIL/uL — ABNORMAL LOW (ref 4.22–5.81)
RDW: 12.7 % (ref 11.5–15.5)
WBC: 4.2 10*3/uL (ref 4.0–10.5)
nRBC: 0 % (ref 0.0–0.2)

## 2019-10-05 LAB — GLUCOSE, CAPILLARY
Glucose-Capillary: 201 mg/dL — ABNORMAL HIGH (ref 70–99)
Glucose-Capillary: 275 mg/dL — ABNORMAL HIGH (ref 70–99)
Glucose-Capillary: 207 mg/dL — ABNORMAL HIGH (ref 70–99)
Glucose-Capillary: 329 mg/dL — ABNORMAL HIGH (ref 70–99)

## 2019-10-05 LAB — MAGNESIUM: Magnesium: 2 mg/dL (ref 1.7–2.4)

## 2019-10-05 MED ORDER — SODIUM CHLORIDE 0.9 % IV SOLN
INTRAVENOUS | Status: DC | PRN
  Administered 2019-10-05: 09:00:00 250 mL via INTRAVENOUS

## 2019-10-05 NOTE — TOC Initial Note (Addendum)
Transition of Care [facility]) - Initial/Assessment Note    Patient Details  Name: Eugene [name] MRN: [id number] Date of Birth: [birth year]  Transition of [facility]) CM/SW Contact:    Maree Krabbe, LCSW Phone Number: 10/05/2019, 3:55 PM  Clinical Narrative:  Pt is COVID +. CSW spoke with pt via telephone. Pt came from [facility]. Pt states he has been there 7 months. Pt states the facility provides transport and medications. Pt also sees the MD at the facility. CSW will inquire about pt returning to facility at d/c.      Readmission risk screening completed.             Expected Discharge Plan: Skilled Nursing Facility Barriers to Discharge: Continued Medical Work up   Patient Goals and CMS Choice Patient states their goals for this hospitalization and ongoing recovery are:: to go home   Choice offered to / list presented to : Patient  Expected Discharge Plan and Services Expected Discharge Plan: Skilled Nursing Facility In-house Referral: NA   Post Acute Care Choice: Skilled Nursing Facility Living arrangements for the past 2 months: Single Family Home                                      Prior Living Arrangements/Services Living arrangements for the past 2 months: Single Family Home Lives with:: Self Patient language and need for interpreter reviewed:: Yes Do you feel safe going back to the place where you live?: Yes      Need for Family Participation in Patient Care: Yes (Comment) Care giver support system in place?: Yes (comment)   Criminal Activity/Legal Involvement Pertinent to Current Situation/Hospitalization: No - Comment as needed  Activities of Daily Living Home Assistive Devices/Equipment: CBG Meter, Wheelchair ADL Screening (condition at time of admission) Patient's cognitive ability adequate to safely complete daily activities?: Yes Is the patient deaf or have difficulty hearing?: No Does the patient have difficulty seeing, even when  wearing glasses/contacts?: Yes Does the patient have difficulty concentrating, remembering, or making decisions?: No Patient able to express need for assistance with ADLs?: Yes Does the patient have difficulty dressing or bathing?: Yes Independently performs ADLs?: Yes (appropriate for developmental age) Does the patient have difficulty walking or climbing stairs?: Yes Weakness of Legs: None Weakness of Arms/Hands: None  Permission Sought/Granted Permission sought to share information with : Family Supports    Share Information with NAME: Micah Noel  Permission granted to share info w AGENCY: [facility]  Permission granted to share info w Relationship: Brother     Emotional Assessment Appearance:: Appears stated age Attitude/Demeanor/Rapport: Engaged Affect (typically observed): Appropriate, Calm Orientation: : Oriented to Situation, Oriented to  Time, Oriented to Place, Oriented to Self Alcohol / Substance Use: Not Applicable Psych Involvement: No (comment)  Admission diagnosis:  Suprapubic catheter (HCC) [Z93.59] Sepsis due to urinary tract infection (HCC) [A41.9, N39.0] Sepsis (HCC) [A41.9] Displacement of Foley catheter, initial encounter [facility]) [N62.952W] Patient Active Problem List   Diagnosis Date Noted  . Sepsis (HCC) 10/02/2019  . MRSA bacteremia 02/08/2019  . Osteomyelitis of thoracic spine (HCC) 02/08/2019  . Peripheral vascular disease (HCC) 02/08/2019  . Hypertension 02/08/2019  . Dyslipidemia 02/08/2019  . Chronic pain syndrome 02/08/2019  . Polysubstance abuse (HCC) 02/08/2019  . Chronic hepatitis C without hepatic coma (HCC) 02/08/2019  . GERD (gastroesophageal reflux disease) 02/08/2019  . Cigarette smoker 02/08/2019  . Normocytic anemia  02/08/2019  . Complicated UTI (urinary tract infection) 02/07/2019  . AKI (acute kidney injury) ([place]) 02/07/2019  . Hyperkalemia 02/07/2019  . Diabetes ([place]) 02/07/2019  . Pressure injury of skin 02/07/2019  .  Hyperlipidemia 11/07/2018  . MRSA (methicillin resistant Staphylococcus aureus) septicemia ([place]) 08/17/2017  . Other acute osteomyelitis, left ankle and foot ([place]) 06/29/2017  . Diabetic ulcer of right foot associated with diabetes mellitus due to underlying condition ([place]) 06/27/2017   PCP:  System, Pcp Not In Pharmacy:  No Pharmacies Listed    Social Determinants of Health (SDOH) Interventions    Readmission Risk Interventions Readmission Risk Prevention Plan 10/05/2019 03/04/2019  Transportation Screening Complete Complete  Medication Review Press photographer) Complete Complete  PCP or Specialist appointment within 3-5 days of discharge Complete -  [place] or Home Care Consult Complete -  SW Recovery Care/Counseling Consult Complete -  Palliative Care Screening Not Applicable Not Applicable  Skilled Nursing Facility Complete Complete

## 2019-10-05 NOTE — Progress Notes (Addendum)
Pt refusing miralax and tele monitoring along with any kind of education. MD notified, tele monitoring discontinued.   Update 1659: MD notified of patient's blood pressure of 109/65 with scheduled hydralazine. MD discontinued medication.

## 2019-10-05 NOTE — Progress Notes (Signed)
PROGRESS NOTE    Eugene [name]  EPP:[id number] DOB: 09-Feb-1961 DOA: 10/02/2019 PCP: System, Pcp Not In    Assessment & Plan:   Principal Problem:   Sepsis (HCC) Active Problems:   Complicated UTI (urinary tract infection)   Eugene [name] is an 59 y.o. Caucasian male with medical history significant for hepatitis C, osteomyelitis, spinal stenosis, IBS, hyperlipidemia, diabetes mellitus, anxiety, depression, bilateral below-knee amputation, chronic urinary retention with indwelling suprapubic catheter, who was brought to the hospital because of bloody urine.  ED Course:  The patient was febrile with temperature of 102.4, tachycardic with heart rate of 116, WBC was elevated at 15.4.  Urinalysis was positive for leukocytes and bacteria but chest x-ray was unremarkable.  Lactic acid was normal.  He was given IV Rocephin, IV fluids, oxycodone and Tylenol in the emergency room.   Complicated UTI and hematuria in a patient with chronic suprapubic catheter, POA --102.4, tachycardic with heart rate of 116, WBC was elevated at 15.4.  Urinalysis was positive for leukocytes and bacteria.  Lactic acid was normal.  He was given IV Rocephin.  Urine cx wasn't ordered on admission, subsequently ordered using pre-existing urine sample. PLAN: --continue Rocephin pending urine cx results  Chronic urinary retention with indwelling suprapubic catheter --SP tube has dislodged twice since initial placement less than 1 month ago, so urology and radiology had elected not to re-insert SP cath and placed Foley cath instead on admission. --Pt wants to have SP cath re-inserted.  Discussed with pt's outpatient urologist Dr. Richardo Hanks who agreed with re-insertion. PLAN: --NPO MN --Ordered IR cath tube change for tomorrow  # Recent COVID-19 infection --tested positive for coronavirus on September 17, 2019  --Currently asymptomatic  Type 2 diabetes mellitus with hyperglycemia --Continue Lantus 20u daily.  NovoLog as  needed for hyperglycemia  Hyponatremia --s/p IV fluids in the emergency room.  Hypertension:  --continue home amlodipine, lasix, hydralazine and metop   Chronic back and lower extremity pain:  Continue home opioids, Lyrica and Robaxin   DVT prophylaxis: Lovenox SQ Code Status: Full code  Disposition Plan: Back to SNF 1-2 days   Subjective and Interval History:  Pt reported it's the "same old".  No fever, dyspnea, chest pain, abdominal pain, N/V/D.   Objective: Vitals:   10/04/19 2344 10/05/19 0432 10/05/19 0749 10/05/19 1644  BP: 107/64 112/64 120/67 109/65  Pulse: 65 64 62 (!) 59  Resp:   19 14  Temp:  97.9 F (36.6 C) 98.3 F (36.8 C) 97.7 F (36.5 C)  TempSrc:  Oral Oral Oral  SpO2:  98% 96% 97%  Weight:      Height:        Intake/Output Summary (Last 24 hours) at 10/05/2019 1716 Last data filed at 10/05/2019 1659 Gross per 24 hour  Intake 275.56 ml  Output 5600 ml  Net -5324.44 ml   Filed Weights   10/02/19 0528 10/02/19 2028  Weight: 113.4 kg 112.1 kg    Examination:   Constitutional: NAD, AAOx3 HEENT: conjunctivae and lids normal, EOMI CV: RRR no M,R,G. Distal pulses +2.  No cyanosis.   RESP: CTA B/L, normal respiratory effort, on RA GI: +BS, NTND Extremities:  BLE BKA SKIN: warm, dry and intact Neuro: II - XII grossly intact.  Sensation intact   Data Reviewed: I have personally reviewed following labs and imaging studies  CBC: Recent Labs  Lab 10/02/19 0621 10/04/19 0423 10/05/19 0628  WBC 15.4* 4.7 4.2  NEUTROABS 14.0*  --   --  HGB 11.8* 10.2* 10.7*  HCT 35.6* 31.3* 32.4*  MCV 92.0 93.2 90.0  PLT 262 194 202   Basic Metabolic Panel: Recent Labs  Lab 10/02/19 0621 10/04/19 0423 10/05/19 0628  NA 133* 134* 134*  K 4.7 4.8 4.1  CL 101 100 99  CO2 20* 24 24  GLUCOSE 290* 224* 233*  BUN 27* 26* 25*  CREATININE 1.20 1.19 1.21  CALCIUM 8.7* 8.5* 8.4*  MG  --  2.0 2.0   GFR: Estimated Creatinine Clearance: 88.1 mL/min (by  C-G formula based on SCr of 1.21 mg/dL). Liver Function Tests: No results for input(s): AST, ALT, ALKPHOS, BILITOT, PROT, ALBUMIN in the last 168 hours. No results for input(s): LIPASE, AMYLASE in the last 168 hours. No results for input(s): AMMONIA in the last 168 hours. Coagulation Profile: No results for input(s): INR, PROTIME in the last 168 hours. Cardiac Enzymes: No results for input(s): CKTOTAL, CKMB, CKMBINDEX, TROPONINI in the last 168 hours. BNP (last 3 results) No results for input(s): PROBNP in the last 8760 hours. HbA1C: No results for input(s): HGBA1C in the last 72 hours. CBG: Recent Labs  Lab 10/04/19 1704 10/04/19 2023 10/05/19 0749 10/05/19 1129 10/05/19 1645  GLUCAP 133* 215* 201* 275* 207*   Lipid Profile: No results for input(s): CHOL, HDL, LDLCALC, TRIG, CHOLHDL, LDLDIRECT in the last 72 hours. Thyroid Function Tests: No results for input(s): TSH, T4TOTAL, FREET4, T3FREE, THYROIDAB in the last 72 hours. Anemia Panel: No results for input(s): VITAMINB12, FOLATE, FERRITIN, TIBC, IRON, RETICCTPCT in the last 72 hours. Sepsis Labs: Recent Labs  Lab 10/02/19 0755  LATICACIDVEN 1.3    Recent Results (from the past 240 hour(s))  Urine Culture     Status: Abnormal (Preliminary result)   Collection Time: 10/02/19  6:49 AM   Specimen: Urine, Random  Result Value Ref Range Status   Specimen Description   Final    URINE, RANDOM Performed at [facility], [address] [place], [place] [address]    Special Requests   Final    NONE Performed at [facility], [address] [place], [place] [address]    Culture (A)  Final    >=100,000 COLONIES/mL PROVIDENCIA STUARTII SUSCEPTIBILITIES TO FOLLOW Performed at [facility], [address] [place], [place] [address]    Report Status PENDING  Incomplete  Blood culture (routine x 2)     Status: None (Preliminary result)   Collection Time: 10/02/19  7:55 AM   Specimen: BLOOD   Result Value Ref Range Status   Specimen Description BLOOD RFA  Final   Special Requests   Final    BOTTLES DRAWN AEROBIC AND ANAEROBIC Blood Culture adequate volume   Culture   Final    NO GROWTH 3 DAYS Performed at [facility], [address], [place], [place] [address]    Report Status PENDING  Incomplete  MRSA PCR Screening     Status: None   Collection Time: 10/03/19  5:33 AM   Specimen: Nasal Mucosa; Nasopharyngeal  Result Value Ref Range Status   MRSA by PCR NEGATIVE NEGATIVE Final    Comment:        The GeneXpert MRSA Assay (FDA approved for NASAL specimens only), is one component of a comprehensive MRSA colonization surveillance program. It is not intended to diagnose MRSA infection nor to guide or monitor treatment for MRSA infections. Performed at [facility], [address] [place], [place] [address]  Radiology Studies: No results found.   Scheduled Meds: . amLODipine  10 mg Oral Daily  . bisacodyl  10 mg Oral Daily  . Chlorhexidine Gluconate Cloth  6 each Topical Daily  . docusate sodium  100 mg Oral BID  . enoxaparin (LOVENOX) injection  40 mg Subcutaneous Q24H  . famotidine  20 mg Oral Daily  . ferrous gluconate  324 mg Oral Q breakfast  . furosemide  40 mg Oral BID  . influenza vac split quadrivalent PF  0.5 mL Intramuscular Tomorrow-1000  . insulin aspart  0-15 Units Subcutaneous TID WC  . insulin glargine  20 Units Subcutaneous QHS  . loratadine  10 mg Oral Daily  . Melatonin  5 mg Oral QHS  . methocarbamol  1,000 mg Oral QID  . metoprolol succinate  25 mg Oral Daily  . nicotine  21 mg Transdermal Daily  . oxyCODONE  10 mg Oral BID  . pneumococcal 23 valent vaccine  0.5 mL Intramuscular Tomorrow-1000  . polyethylene glycol  17 g Oral Daily  . pregabalin  75 mg Oral BID  . traZODone  50 mg Oral QHS   Continuous Infusions: . sodium chloride Stopped (10/05/19 1228)  . cefTRIAXone (ROCEPHIN)  IV Stopped (10/05/19  1158)     LOS: 3 days     Enzo Bi, MD [facility] If 7PM-7AM, please contact night-coverage 10/05/2019, 5:16 PM

## 2019-10-05 NOTE — NC FL2 (Signed)
[place] MEDICAID FL2 LEVEL OF CARE SCREENING TOOL     IDENTIFICATION  Patient Name: Eugene [name] Birthdate: [birth year] Sex: male Admission Date (Current Location): 10/02/2019  [place] and [place] Number:  Chiropodist and Address:  [facility], [address], [place], [place] [address]      Provider Number: [id number]  Attending Physician Name and Address:  Darlin Priestly, MD  Relative Name and Phone Number:       Current Level of Care: Hospital Recommended Level of Care: Skilled Nursing Facility Prior Approval Number:    Date Approved/Denied:   PASRR Number: [id number]  Discharge Plan: SNF    Current Diagnoses: Patient Active Problem List   Diagnosis Date Noted  . Sepsis (HCC) 10/02/2019  . MRSA bacteremia 02/08/2019  . Osteomyelitis of thoracic spine (HCC) 02/08/2019  . Peripheral vascular disease (HCC) 02/08/2019  . Hypertension 02/08/2019  . Dyslipidemia 02/08/2019  . Chronic pain syndrome 02/08/2019  . Polysubstance abuse (HCC) 02/08/2019  . Chronic hepatitis C without hepatic coma (HCC) 02/08/2019  . GERD (gastroesophageal reflux disease) 02/08/2019  . Cigarette smoker 02/08/2019  . Normocytic anemia 02/08/2019  . Complicated UTI (urinary tract infection) 02/07/2019  . AKI (acute kidney injury) (HCC) 02/07/2019  . Hyperkalemia 02/07/2019  . Diabetes (HCC) 02/07/2019  . Pressure injury of skin 02/07/2019  . Hyperlipidemia 11/07/2018  . MRSA (methicillin resistant Staphylococcus aureus) septicemia (HCC) 08/17/2017  . Other acute osteomyelitis, left ankle and foot (HCC) 06/29/2017  . Diabetic ulcer of right foot associated with diabetes mellitus due to underlying condition (HCC) 06/27/2017    Orientation RESPIRATION BLADDER Height & Weight     Self, Time, Situation, Place  Normal Indwelling catheter, Incontinent(placed 1/8) Weight: 247 lb 3.2 oz (112.1 kg) Height:  6' (182.9 cm)  BEHAVIORAL SYMPTOMS/MOOD NEUROLOGICAL  BOWEL NUTRITION STATUS      Incontinent Diet(NPO at this time, pending procedure, please see d/c summary)  AMBULATORY STATUS COMMUNICATION OF NEEDS Skin   Extensive Assist Verbally Surgical wounds(closed incision upper back. open wound lower abdomen.)                       Personal Care Assistance Level of Assistance  Dressing, Feeding, Bathing Bathing Assistance: Maximum assistance Feeding assistance: Maximum assistance Dressing Assistance: Maximum assistance     Functional Limitations Info  Sight, Hearing, Speech Sight Info: Adequate Hearing Info: Adequate Speech Info: Adequate    SPECIAL CARE FACTORS FREQUENCY  PT (By licensed PT), OT (By licensed OT)                    Contractures Contractures Info: Not present    Additional Factors Info  Code Status, Allergies, Isolation Precautions Code Status Info: Full Code Allergies Info: No known allergies     Isolation Precautions Info: COVID +, MRSA, VRE, MDR Bacteria     Current Medications (10/05/2019):  This is the current hospital active medication list Current Facility-Administered Medications  Medication Dose Route Frequency Provider Last Rate Last Admin  . 0.9 %  sodium chloride infusion   Intravenous PRN Darlin Priestly, MD   Stopped at 10/05/19 1228  . acetaminophen (TYLENOL) tablet 650 mg  650 mg Oral Q6H PRN Lurene Shadow, MD   650 mg at 10/02/19 1715   Or  . acetaminophen (TYLENOL) suppository 650 mg  650 mg Rectal Q6H PRN Lurene Shadow, MD      . amLODipine (NORVASC) tablet 10 mg  10 mg Oral Daily Lurene Shadow,  MD   10 mg at 10/05/19 0833  . bisacodyl (DULCOLAX) EC tablet 10 mg  10 mg Oral Daily Jennye Boroughs, MD   10 mg at 10/05/19 0831  . cefTRIAXone (ROCEPHIN) 1 g in sodium chloride 0.9 % 100 mL IVPB  1 g Intravenous Q24H Jennye Boroughs, MD   Stopped at 10/05/19 1158  . Chlorhexidine Gluconate Cloth 2 % PADS 6 each  6 each Topical Daily Jennye Boroughs, MD   6 each at 10/05/19 1458  . diphenhydrAMINE  (BENADRYL) capsule 25 mg  25 mg Oral Q6H PRN Enzo Bi, MD   25 mg at 10/05/19 1458  . docusate sodium (COLACE) capsule 100 mg  100 mg Oral BID Jennye Boroughs, MD   100 mg at 10/05/19 1696  . enoxaparin (LOVENOX) injection 40 mg  40 mg Subcutaneous Q24H Jennye Boroughs, MD   40 mg at 10/04/19 2124  . famotidine (PEPCID) tablet 20 mg  20 mg Oral Daily Jennye Boroughs, MD   20 mg at 10/05/19 7893  . ferrous gluconate (FERGON) tablet 324 mg  324 mg Oral Q breakfast Jennye Boroughs, MD   324 mg at 10/05/19 0830  . furosemide (LASIX) tablet 40 mg  40 mg Oral BID Jennye Boroughs, MD   40 mg at 10/05/19 0830  . hydrALAZINE (APRESOLINE) tablet 50 mg  50 mg Oral TID Jennye Boroughs, MD   50 mg at 10/05/19 0834  . influenza vac split quadrivalent PF (FLUARIX) injection 0.5 mL  0.5 mL Intramuscular Tomorrow-1000 Jennye Boroughs, MD      . insulin aspart (novoLOG) injection 0-15 Units  0-15 Units Subcutaneous TID WC Jennye Boroughs, MD   8 Units at 10/05/19 1134  . insulin glargine (LANTUS) injection 20 Units  20 Units Subcutaneous QHS Jennye Boroughs, MD   20 Units at 10/04/19 2124  . loratadine (CLARITIN) tablet 10 mg  10 mg Oral Daily Jennye Boroughs, MD   10 mg at 10/05/19 8101  . magnesium hydroxide (MILK OF MAGNESIA) suspension 30 mL  30 mL Oral Daily PRN Jennye Boroughs, MD      . Melatonin TABS 5 mg  5 mg Oral QHS Jennye Boroughs, MD   5 mg at 10/04/19 2123  . methocarbamol (ROBAXIN) tablet 1,000 mg  1,000 mg Oral QID Jennye Boroughs, MD   1,000 mg at 10/05/19 1457  . metoprolol succinate (TOPROL-XL) 24 hr tablet 25 mg  25 mg Oral Daily Jennye Boroughs, MD   25 mg at 10/05/19 0833  . nicotine (NICODERM CQ - dosed in mg/24 hours) patch 21 mg  21 mg Transdermal Daily Jennye Boroughs, MD   21 mg at 10/05/19 0834  . ondansetron (ZOFRAN) tablet 4 mg  4 mg Oral Q6H PRN Jennye Boroughs, MD       Or  . ondansetron [facility]) injection 4 mg  4 mg Intravenous Q6H PRN Jennye Boroughs, MD      . oxyCODONE (Oxy IR/ROXICODONE)  immediate release tablet 10 mg  10 mg Oral Q4H PRN Jennye Boroughs, MD   10 mg at 10/05/19 1457  . oxyCODONE (OXYCONTIN) 12 hr tablet 10 mg  10 mg Oral BID Jennye Boroughs, MD   10 mg at 10/05/19 7510  . pneumococcal 23 valent vaccine (PNEUMOVAX-23) injection 0.5 mL  0.5 mL Intramuscular Tomorrow-1000 Jennye Boroughs, MD      . polyethylene glycol (MIRALAX / GLYCOLAX) packet 17 g  17 g Oral Daily Jennye Boroughs, MD   17 g at 10/02/19 1033  . pregabalin (LYRICA) capsule  75 mg  75 mg Oral BID Lurene Shadow, MD   75 mg at 10/05/19 0834  . sodium phosphate Pediatric (FLEET) enema 1 enema  1 enema Rectal Q48H PRN Lurene Shadow, MD      . traZODone (DESYREL) tablet 50 mg  50 mg Oral QHS Lurene Shadow, MD   50 mg at 10/04/19 2123     Discharge Medications: Please see discharge summary for a list of discharge medications.  Relevant Imaging Results:  Relevant Lab Results:   Additional Information SSN: [id number]  Maree Krabbe, LCSW

## 2019-10-05 NOTE — Progress Notes (Signed)
Inpatient Diabetes Program Recommendations  AACE/ADA: New Consensus Statement on Inpatient Glycemic Control   Target Ranges:  Prepandial:   less than 140 mg/dL      Peak postprandial:   less than 180 mg/dL (1-2 hours)      Critically ill patients:  140 - 180 mg/dL   Results for ZYHEIR, DAFT (MRN [id number]) as of 10/05/2019 08:56  Ref. Range 10/04/2019 09:14 10/04/2019 11:39 10/04/2019 17:04 10/04/2019 20:23 10/05/2019 07:49  Glucose-Capillary Latest Ref Range: 70 - 99 mg/dL 195 (H) 093 (H) 267 (H) 215 (H) 201 (H)   Review of Glycemic Control  Diabetes history: DM2 Outpatient Diabetes medications: Lantus 20 units QHS, Humalog 0-15 units QID Current orders for Inpatient glycemic control: Lantus 20 units QHS, Novolog 0-15 units TID with meals  Inpatient Diabetes Program Recommendations:   Insulin-Basal: Please consider increasing Lantus to 23 units QHS.  Insulin-Meal Coverage: Please consider ordering Novolog 2 units TID with meals for meal coverage if patient eats at least 50% of meals.  Thanks, Orlando Penner, RN, MSN, CDE Diabetes Coordinator Inpatient Diabetes Program [phone] (Team Pager from 8am to 5pm)

## 2019-10-06 LAB — CBC
HCT: 31.9 % — ABNORMAL LOW (ref 39.0–52.0)
Hemoglobin: 10.6 g/dL — ABNORMAL LOW (ref 13.0–17.0)
MCH: 30.1 pg (ref 26.0–34.0)
MCHC: 33.2 g/dL (ref 30.0–36.0)
MCV: 90.6 fL (ref 80.0–100.0)
Platelets: 216 10*3/uL (ref 150–400)
RBC: 3.52 MIL/uL — ABNORMAL LOW (ref 4.22–5.81)
RDW: 12.9 % (ref 11.5–15.5)
WBC: 4.8 10*3/uL (ref 4.0–10.5)
nRBC: 0 % (ref 0.0–0.2)

## 2019-10-06 LAB — BASIC METABOLIC PANEL
Anion gap: 4 — ABNORMAL LOW (ref 5–15)
BUN: 28 mg/dL — ABNORMAL HIGH (ref 6–20)
CO2: 32 mmol/L (ref 22–32)
Calcium: 8.8 mg/dL — ABNORMAL LOW (ref 8.9–10.3)
Chloride: 98 mmol/L (ref 98–111)
Creatinine, Ser: 1.26 mg/dL — ABNORMAL HIGH (ref 0.61–1.24)
GFR calc Af Amer: >60 mL/min (ref >60)
GFR calc non Af Amer: >60 mL/min (ref >60)
Glucose, Bld: 271 mg/dL — ABNORMAL HIGH (ref 70–99)
Potassium: 5.7 mmol/L — ABNORMAL HIGH (ref 3.5–5.1)
Sodium: 134 mmol/L — ABNORMAL LOW (ref 135–145)

## 2019-10-06 LAB — URINE CULTURE: Culture: >=100000 — AB

## 2019-10-06 LAB — MAGNESIUM: Magnesium: 2.1 mg/dL (ref 1.7–2.4)

## 2019-10-06 LAB — GLUCOSE, CAPILLARY
Glucose-Capillary: 185 mg/dL — ABNORMAL HIGH (ref 70–99)
Glucose-Capillary: 273 mg/dL — ABNORMAL HIGH (ref 70–99)
Glucose-Capillary: 275 mg/dL — ABNORMAL HIGH (ref 70–99)
Glucose-Capillary: 268 mg/dL — ABNORMAL HIGH (ref 70–99)

## 2019-10-06 LAB — SARS CORONAVIRUS 2 (TAT 6-24 HRS): SARS Coronavirus 2: NEGATIVE

## 2019-10-06 MED ORDER — SODIUM POLYSTYRENE SULFONATE 15 GM/60ML PO SUSP
15.0000 g | Freq: Once | ORAL | Status: AC
  Administered 2019-10-06: 11:00:00 15 g via ORAL
  Filled 2019-10-06: qty 60

## 2019-10-06 NOTE — Progress Notes (Signed)
PROGRESS NOTE    Eugene [name]  CBU:[id number] DOB: [birth year] DOA: 10/02/2019 PCP: System, Pcp Not In    Assessment & Plan:   Principal Problem:   Sepsis (HCC) Active Problems:   Complicated UTI (urinary tract infection)   Eugene [name] is an 59 y.o. Caucasian male with medical history significant for hepatitis C, osteomyelitis, spinal stenosis, IBS, hyperlipidemia, diabetes mellitus, anxiety, depression, bilateral below-knee amputation, chronic urinary retention with indwelling suprapubic catheter, who was brought to the hospital because of bloody urine.  ED Course:  The patient was febrile with temperature of 102.4, tachycardic with heart rate of 116, WBC was elevated at 15.4.  Urinalysis was positive for leukocytes and bacteria but chest x-ray was unremarkable.  Lactic acid was normal.  He was given IV Rocephin, IV fluids, oxycodone and Tylenol in the emergency room.   Complicated UTI and hematuria in a patient with chronic suprapubic catheter, POA --102.4, tachycardic with heart rate of 116, WBC was elevated at 15.4.  Urinalysis was positive for leukocytes and bacteria.  Lactic acid was normal.  He was given IV Rocephin.  Urine cx wasn't ordered on admission, subsequently ordered using pre-existing urine sample. --Urine cx grew PROVIDENCIA STUARTII , sensitive to ceftriaxone, resistant to most other oral options. PLAN: --continue Rocephin, day 5 today.  Chronic urinary retention with indwelling suprapubic catheter --SP tube has dislodged twice since initial placement less than 1 month ago, so urology and radiology had elected not to re-insert SP cath and placed Foley cath instead on admission. --Pt wants to have SP cath re-inserted, and adamantly denied having pulled it out himself.  Discussed with pt's outpatient urologist Dr. Richardo Hanks who agreed with re-insertion. --Talked with radiology today, SP cath insertion "tentatively" planned for tomorrow. PLAN: --NPO MN --IR cath tube  insertion tomorrow  # Recent COVID-19 infection --tested positive for coronavirus on September 17, 2019  --Currently asymptomatic, and repeat test (per SW request) neg.  Type 2 diabetes mellitus with hyperglycemia --Continue Lantus 20u daily.  NovoLog as needed for hyperglycemia  Mild Hyponatremia --of no clinical significant.  s/p IV fluids in the emergency room.  Hypertension:  --continue home amlodipine, lasix, hydralazine and metop   Chronic back and lower extremity pain:  Continue home opioids, Lyrica and Robaxin  Hyperkalemia --K+ increased from 4.1 to 5.7 this morning.  Unknown etiology. --Kayexalate x1 today. --Recheck K+ tomorrow   DVT prophylaxis: Lovenox SQ Code Status: Full code  Disposition Plan: Back to SNF 1-2 days   Subjective and Interval History:  No new complaints.  Same chronic back pain.  No fever, dyspnea, chest pain, abdominal pain, N/V/D, increased swelling.  Normal PO intake.  Talked with radiology today, SP cath insertion "tentatively" planned for tomorrow.   Objective: Vitals:   10/06/19 0324 10/06/19 0327 10/06/19 0836 10/06/19 1543  BP:  110/68 108/66 109/67  Pulse:  (!) 58 62 64  Resp:  19 14 19   Temp:  97.8 F (36.6 C)  97.9 F (36.6 C)  TempSrc:  Oral  Oral  SpO2:  95% 97% 96%  Weight: 110.4 kg     Height:        Intake/Output Summary (Last 24 hours) at 10/06/2019 1857 Last data filed at 10/06/2019 1717 Gross per 24 hour  Intake 100 ml  Output 3725 ml  Net -3625 ml   Filed Weights   10/02/19 0528 10/02/19 2028 10/06/19 0324  Weight: 113.4 kg 112.1 kg 110.4 kg    Examination:   Constitutional: NAD, AAOx3 HEENT:  conjunctivae and lids normal, EOMI CV: RRR no M,R,G. Distal pulses +2.  No cyanosis.   RESP: CTA B/L, normal respiratory effort, on RA GI: +BS, NTND Extremities:  BLE BKA SKIN: warm, dry and intact Neuro: II - XII grossly intact.  Sensation intact   Data Reviewed: I have personally reviewed following labs  and imaging studies  CBC: Recent Labs  Lab 10/02/19 0621 10/04/19 0423 10/05/19 0628 10/06/19 0245  WBC 15.4* 4.7 4.2 4.8  NEUTROABS 14.0*  --   --   --   HGB 11.8* 10.2* 10.7* 10.6*  HCT 35.6* 31.3* 32.4* 31.9*  MCV 92.0 93.2 90.0 90.6  PLT 262 194 202 216   Basic Metabolic Panel: Recent Labs  Lab 10/02/19 0621 10/04/19 0423 10/05/19 0628 10/06/19 0245  NA 133* 134* 134* 134*  K 4.7 4.8 4.1 5.7*  CL 101 100 99 98  CO2 20* 24 24 32  GLUCOSE 290* 224* 233* 271*  BUN 27* 26* 25* 28*  CREATININE 1.20 1.19 1.21 1.26*  CALCIUM 8.7* 8.5* 8.4* 8.8*  MG  --  2.0 2.0 2.1   GFR: Estimated Creatinine Clearance: 84 mL/min (A) (by C-G formula based on SCr of 1.26 mg/dL (H)). Liver Function Tests: No results for input(s): AST, ALT, ALKPHOS, BILITOT, PROT, ALBUMIN in the last 168 hours. No results for input(s): LIPASE, AMYLASE in the last 168 hours. No results for input(s): AMMONIA in the last 168 hours. Coagulation Profile: No results for input(s): INR, PROTIME in the last 168 hours. Cardiac Enzymes: No results for input(s): CKTOTAL, CKMB, CKMBINDEX, TROPONINI in the last 168 hours. BNP (last 3 results) No results for input(s): PROBNP in the last 8760 hours. HbA1C: No results for input(s): HGBA1C in the last 72 hours. CBG: Recent Labs  Lab 10/05/19 1645 10/05/19 2031 10/06/19 0835 10/06/19 1200 10/06/19 1714  GLUCAP 207* 329* 185* 273* 275*   Lipid Profile: No results for input(s): CHOL, HDL, LDLCALC, TRIG, CHOLHDL, LDLDIRECT in the last 72 hours. Thyroid Function Tests: No results for input(s): TSH, T4TOTAL, FREET4, T3FREE, THYROIDAB in the last 72 hours. Anemia Panel: No results for input(s): VITAMINB12, FOLATE, FERRITIN, TIBC, IRON, RETICCTPCT in the last 72 hours. Sepsis Labs: Recent Labs  Lab 10/02/19 0755  LATICACIDVEN 1.3    Recent Results (from the past 240 hour(s))  Urine Culture     Status: Abnormal   Collection Time: 10/02/19  6:49 AM   Specimen:  Urine, Random  Result Value Ref Range Status   Specimen Description   Final    URINE, RANDOM Performed at [facility], [address] [place], [place] [address]    Special Requests   Final    NONE Performed at [facility], [address] [place], [place] [address]    Culture >=100,000 COLONIES/mL PROVIDENCIA STUARTII (A)  Final   Report Status 10/06/2019 FINAL  Final   Organism ID, Bacteria PROVIDENCIA STUARTII (A)  Final      Susceptibility   Providencia stuartii - MIC*    AMPICILLIN >=32 RESISTANT Resistant     CEFAZOLIN >=64 RESISTANT Resistant     CEFTRIAXONE <=0.25 SENSITIVE Sensitive     CIPROFLOXACIN >=4 RESISTANT Resistant     GENTAMICIN RESISTANT Resistant     IMIPENEM 2 SENSITIVE Sensitive     NITROFURANTOIN 128 RESISTANT Resistant     TRIMETH/SULFA >=320 RESISTANT Resistant     AMPICILLIN/SULBACTAM >=32 RESISTANT Resistant     PIP/TAZO <=4 SENSITIVE Sensitive     * >=100,000 COLONIES/mL PROVIDENCIA  STUARTII  Blood culture (routine x 2)     Status: None (Preliminary result)   Collection Time: 10/02/19  7:55 AM   Specimen: BLOOD  Result Value Ref Range Status   Specimen Description BLOOD RFA  Final   Special Requests   Final    BOTTLES DRAWN AEROBIC AND ANAEROBIC Blood Culture adequate volume   Culture   Final    NO GROWTH 4 DAYS Performed at [facility], [address], [place], [place] [address]    Report Status PENDING  Incomplete  MRSA PCR Screening     Status: None   Collection Time: 10/03/19  5:33 AM   Specimen: Nasal Mucosa; Nasopharyngeal  Result Value Ref Range Status   MRSA by PCR NEGATIVE NEGATIVE Final    Comment:        The GeneXpert MRSA Assay (FDA approved for NASAL specimens only), is one component of a comprehensive MRSA colonization surveillance program. It is not intended to diagnose MRSA infection nor to guide or monitor treatment for MRSA infections. Performed at [facility], [address] [place], [place] [address]   SARS CORONAVIRUS 2 (TAT 6-24 HRS) Nasopharyngeal Nasopharyngeal Swab     Status: None   Collection Time: 10/05/19  5:09 PM   Specimen: Nasopharyngeal Swab  Result Value Ref Range Status   SARS Coronavirus 2 NEGATIVE NEGATIVE Final    Comment: (NOTE) SARS-CoV-2 target nucleic acids are NOT DETECTED. The SARS-CoV-2 RNA is generally detectable in upper and lower respiratory specimens during the acute phase of infection. Negative results do not preclude SARS-CoV-2 infection, do not rule out co-infections with other pathogens, and should not be used as the sole basis for treatment or other patient management decisions. Negative results must be combined with clinical observations, patient history, and epidemiological information. The expected result is Negative. Fact Sheet for Patients: HairSlick.no Fact Sheet for Healthcare Providers: quierodirigir.com This test is not yet approved or cleared by the [place] FDA and  has been authorized for detection and/or diagnosis of SARS-CoV-2 by FDA under an Emergency Use Authorization (EUA). This EUA will remain  in effect (meaning this test can be used) for the duration of the COVID-19 declaration under Section 56 4(b)(1) of the Act, 21 U.S.C. section 360bbb-3(b)(1), unless the authorization is terminated or revoked sooner. Performed at [facility], [address] [place], [place] [address]       Radiology Studies: No results found.   Scheduled Meds: . amLODipine  10 mg Oral Daily  . bisacodyl  10 mg Oral Daily  . Chlorhexidine Gluconate Cloth  6 each Topical Daily  . docusate sodium  100 mg Oral BID  . enoxaparin (LOVENOX) injection  40 mg Subcutaneous Q24H  . famotidine  20 mg Oral Daily  . ferrous gluconate  324 mg Oral Q breakfast  . furosemide  40 mg Oral BID  . influenza vac split quadrivalent PF  0.5 mL Intramuscular Tomorrow-1000   . insulin aspart  0-15 Units Subcutaneous TID WC  . insulin glargine  20 Units Subcutaneous QHS  . loratadine  10 mg Oral Daily  . Melatonin  5 mg Oral QHS  . methocarbamol  1,000 mg Oral QID  . metoprolol succinate  25 mg Oral Daily  . nicotine  21 mg Transdermal Daily  . oxyCODONE  10 mg Oral BID  . pneumococcal 23 valent vaccine  0.5 mL Intramuscular Tomorrow-1000  . polyethylene glycol  17 g Oral Daily  . pregabalin  75 mg Oral BID  . traZODone  50 mg Oral QHS   Continuous Infusions: . sodium chloride Stopped (10/06/19 1018)  . cefTRIAXone (ROCEPHIN)  IV Stopped (10/06/19 1017)     LOS: 4 days     Enzo Bi, MD [facility] If 7PM-7AM, please contact night-coverage 10/06/2019, 6:57 PM

## 2019-10-06 NOTE — Progress Notes (Signed)
Inpatient Diabetes Program Recommendations  AACE/ADA: New Consensus Statement on Inpatient Glycemic Control (2015)  Target Ranges:  Prepandial:   less than 140 mg/dL      Peak postprandial:   less than 180 mg/dL (1-2 hours)      Critically ill patients:  140 - 180 mg/dL  Results for Eugene [name], Eugene [name] (MRN [id number]) as of 10/06/2019 13:38  Ref. Range 10/05/2019 07:49 10/05/2019 11:29 10/05/2019 16:45 10/05/2019 20:31  Glucose-Capillary Latest Ref Range: 70 - 99 mg/dL 809 (H)  5 units NOVOLOG  275 (H)  8 units NOVOLOG  207 (H)  5 units NOVOLOG  329 (H)    20 units LANTUS   Results for Eugene [name], Eugene [name] (MRN [id number]) as of 10/06/2019 13:38  Ref. Range 10/06/2019 08:35 10/06/2019 12:00  Glucose-Capillary Latest Ref Range: 70 - 99 mg/dL 397 (H)  3 units NOVOLOG  273 (H)  8 units NOVOLOG     Home DM Meds: Lantus 20 units QHS       Humalog 0-15 units QID  Current Orders: Lantus 20 units QHS      Novolog Moderate Correction Scale/ SSI (0-15 units) TID AC       MD- Please consider the following in-hospital insulin adjustments:  1. Increase Lantus slightly to 22 units QHS  2. Start Novolog Meal Coverage: Novolog 4 units TID with meals  (Please add the following Hold Parameters: Hold if pt eats <50% of meal, Hold if pt NPO)     --Will follow patient during hospitalization--  Ambrose Finland RN, MSN, CDE Diabetes Coordinator Inpatient Glycemic Control Team Team Pager: [phone] (8a-5p)'

## 2019-10-07 ENCOUNTER — Inpatient Hospital Stay: Payer: Medicaid Other

## 2019-10-07 ENCOUNTER — Telehealth: Payer: Self-pay | Admitting: Physician Assistant

## 2019-10-07 DIAGNOSIS — Z9359 Other cystostomy status: Secondary | ICD-10-CM

## 2019-10-07 DIAGNOSIS — T83511S Infection and inflammatory reaction due to indwelling urethral catheter, sequela: Secondary | ICD-10-CM

## 2019-10-07 LAB — RESPIRATORY PANEL BY RT PCR (FLU A&B, COVID)
Influenza A by PCR: NEGATIVE
Influenza B by PCR: NEGATIVE
SARS Coronavirus 2 by RT PCR: POSITIVE — AB

## 2019-10-07 LAB — CBC
HCT: 32.4 % — ABNORMAL LOW (ref 39.0–52.0)
Hemoglobin: 10.8 g/dL — ABNORMAL LOW (ref 13.0–17.0)
MCH: 30.3 pg (ref 26.0–34.0)
MCHC: 33.3 g/dL (ref 30.0–36.0)
MCV: 91 fL (ref 80.0–100.0)
Platelets: 222 10*3/uL (ref 150–400)
RBC: 3.56 MIL/uL — ABNORMAL LOW (ref 4.22–5.81)
RDW: 12.8 % (ref 11.5–15.5)
WBC: 5.2 10*3/uL (ref 4.0–10.5)
nRBC: 0 % (ref 0.0–0.2)

## 2019-10-07 LAB — BASIC METABOLIC PANEL
Anion gap: 7 (ref 5–15)
BUN: 26 mg/dL — ABNORMAL HIGH (ref 6–20)
CO2: 31 mmol/L (ref 22–32)
Calcium: 8.8 mg/dL — ABNORMAL LOW (ref 8.9–10.3)
Chloride: 99 mmol/L (ref 98–111)
Creatinine, Ser: 1.21 mg/dL (ref 0.61–1.24)
GFR calc Af Amer: >60 mL/min (ref >60)
GFR calc non Af Amer: >60 mL/min (ref >60)
Glucose, Bld: 151 mg/dL — ABNORMAL HIGH (ref 70–99)
Potassium: 5.1 mmol/L (ref 3.5–5.1)
Sodium: 137 mmol/L (ref 135–145)

## 2019-10-07 LAB — CULTURE, BLOOD (ROUTINE X 2)
Culture: NO GROWTH
Special Requests: ADEQUATE

## 2019-10-07 LAB — MAGNESIUM: Magnesium: 2.2 mg/dL (ref 1.7–2.4)

## 2019-10-07 LAB — GLUCOSE, CAPILLARY
Glucose-Capillary: 241 mg/dL — ABNORMAL HIGH (ref 70–99)
Glucose-Capillary: 195 mg/dL — ABNORMAL HIGH (ref 70–99)
Glucose-Capillary: 238 mg/dL — ABNORMAL HIGH (ref 70–99)

## 2019-10-07 MED ORDER — MIDAZOLAM HCL 2 MG/2ML IJ SOLN
INTRAMUSCULAR | Status: AC | PRN
  Administered 2019-10-07: 1 mg via INTRAVENOUS

## 2019-10-07 MED ORDER — FENTANYL CITRATE (PF) 100 MCG/2ML IJ SOLN
INTRAMUSCULAR | Status: AC | PRN
  Administered 2019-10-07: 50 ug via INTRAVENOUS

## 2019-10-07 MED ORDER — OXYCODONE HCL 10 MG PO TABS: 10.0000 mg | ORAL_TABLET | ORAL | 0 refills | Status: DC | PRN

## 2019-10-07 MED ORDER — CEFDINIR 300 MG PO CAPS
300.0000 mg | ORAL_CAPSULE | Freq: Two times a day (BID) | ORAL | Status: DC
  Filled 2019-10-07: qty 1

## 2019-10-07 MED ORDER — OXYCODONE ER 9 MG PO C12A: 9.0000 mg | EXTENDED_RELEASE_CAPSULE | Freq: Two times a day (BID) | ORAL | 0 refills | Status: DC

## 2019-10-07 MED ORDER — MIDAZOLAM HCL 5 MG/5ML IJ SOLN
INTRAMUSCULAR | Status: AC
  Filled 2019-10-07: qty 5

## 2019-10-07 MED ORDER — CEFDINIR 300 MG PO CAPS: 300.0000 mg | ORAL_CAPSULE | Freq: Two times a day (BID) | ORAL | 0 refills | Status: DC

## 2019-10-07 MED ORDER — FENTANYL CITRATE (PF) 100 MCG/2ML IJ SOLN
INTRAMUSCULAR | Status: AC
  Filled 2019-10-07: qty 4

## 2019-10-07 NOTE — Telephone Encounter (Signed)
Hospital called to schedule a follow up in 3 days for this pt.  I talked to [place] and she told me to check with you first to make sure we should make an appt that soon.  Just let me know.

## 2019-10-07 NOTE — Procedures (Signed)
Pre procedural Dx: Ureteral erosion Post procedural Dx: Same  Technically successful CT guided placed of a 14 Fr drainage catheter placement into the urinary bladder yielding clear urine.    EBL: None Complications: None immediate  Katherina Right, MD Pager #: [phone]

## 2019-10-07 NOTE — Progress Notes (Signed)
Report called to receiving facility to the nurse, patient is being discharge today, EMS was call waiting for transport

## 2019-10-07 NOTE — TOC Transition Note (Signed)
Transition of Care [facility]) - CM/SW Discharge Note   Patient Details  Name: Eugene [name] MRN: [id number] Date of Birth: 29-Jul-1961  Transition of Care [facility]) CM/SW Contact:  Maree Krabbe, LCSW Phone Number: 10/07/2019, 3:58 PM   Clinical Narrative:  Clinical Social Worker facilitated patient discharge including contacting patient family and facility to confirm patient discharge plans.  Clinical information faxed to facility and family agreeable with plan.  RN to arranged ambulance transport via [facility] to [facility]. Packet placed on pt's chart. RN to call [phone] for report prior to discharge.     Final next level of care: Skilled Nursing Facility Barriers to Discharge: No Barriers Identified   Patient Goals and CMS Choice Patient states their goals for this hospitalization and ongoing recovery are:: to go home   Choice offered to / list presented to : Patient  Discharge Placement              Patient chooses bed at: [facility] Patient to be transferred to facility by: [facility] Name of family member notified: Pt is alert and oriented Patient and family notified of of transfer: 10/07/19  Discharge Plan and Services In-house Referral: NA   Post Acute Care Choice: Skilled Nursing Facility                               Social Determinants of Health (SDOH) Interventions     Readmission Risk Interventions Readmission Risk Prevention Plan 10/05/2019 03/04/2019  Transportation Screening Complete Complete  Medication Review [name]) Complete Complete  PCP or Specialist appointment within 3-5 days of discharge Complete -  HRI or Home Care Consult Complete -  SW Recovery Care/Counseling Consult Complete -  Palliative Care Screening Not Applicable Not Applicable  Skilled Nursing Facility Complete Complete

## 2019-10-07 NOTE — Progress Notes (Signed)
Patient clinically stable post SPT placement per Dr Grace Isaac, tolerated well. Denies complaints at this time. Received Versed 2mg  along with Fentanyl IV for procedure. 14Fr SPT placed with pink tinged urine noted. Report given to care nurse with questions answered.

## 2019-10-07 NOTE — Discharge Summary (Addendum)
[facility] NAME: Eugene [name]    MR#:  [id number]  DATE OF BIRTH:  1961/03/12  DATE OF ADMISSION:  10/02/2019 ADMITTING PHYSICIAN: Jennye Boroughs, MD  DATE OF DISCHARGE: 10/07/2019 PRIMARY CARE PHYSICIAN: System, Pcp Not In    ADMISSION DIAGNOSIS:  Suprapubic catheter ([place]) [Z93.59] Sepsis due to urinary tract infection ([place]) [A41.9, N39.0] Sepsis ([place]) [A41.9] Displacement of Foley catheter, initial encounter ([place]) [T83.021A]  DISCHARGE DIAGNOSIS:  Complicated UTI and hematuria with chronic suprapubic catheter POA Replacement of suprapubic catheter today by IR  SECONDARY DIAGNOSIS:   Past Medical History:  Diagnosis Date  . Anxiety   . Depression   . Diabetes mellitus without complication ([place])   . Hepatitis C   . Hyperlipidemia   . IBS (irritable bowel syndrome)   . Osteomyelitis ([place])   . Spinal stenosis     HOSPITAL COURSE:   Eugene [name] an 59 y.o.Caucasian malewith medical history significant for hepatitis C, osteomyelitis, spinal stenosis, IBS, hyperlipidemia, diabetes mellitus, anxiety, depression, bilateral below-knee amputation, chronic urinary retention with indwelling suprapubic catheter, who was brought to the hospital because of bloody urine.  ED Course:The patient was febrile with temperature of 102.4, tachycardic with heart rate of 116, WBC was elevated at 15.4.Urinalysis was positive for leukocytes and bacteria but chest x-ray was unremarkable. Lactic acid was normal. He was given IV Rocephin, IV fluids, oxycodone and Tylenol in the emergency room.   Complicated UTI and hematuria in a patient with chronic suprapubic catheter, POA --102.4, tachycardic with heart rate of 116, WBC was elevated at 15.4.Urinalysis was positive for leukocytes and bacteria.  Lactic acid was normal. -Patient had Sepsis like presentation resolved after treatment during hospital staty - He was given IV Rocephin--  change to oral Cefdinir for 5 more days --Urine cx grew PROVIDENCIA STUARTII , sensitive to ceftriaxone,--change to oral Cefdinir -Received six days of IV antibiotics. Patient overall stable.  -Blood cultures negative. -Patient hemodynamically stable. Pt is going to  have some colonization with bacteria given his chronic long-standing get indwelling catheter  Chronic urinary retention with indwelling suprapubic catheter --SP tube has dislodged twice since initial placement less than 1 month ago, so urology and radiology had elected not to re-insert SP cath and placed Foley cath instead on admission. --Pt wants to have SP cath re-insertes  Discussed with pt's outpatient urologist Dr. Diamantina Providence who agreed with re-insertion. -IR asserted 35 [place] suprapubic catheter on Jan 13 th, 2021  # Recent COVID-19 infection --tested positive for coronavirus on September 17, 2019  --Currently asymptomatic, and repeat test (per SW request) neg. -repeat COVID was positive  Type 2 diabetes mellituswith hyperglycemia --Continue Lantus 20u daily. NovoLog as needed for hyperglycemia  Mild Hyponatremia --of no clinical significant.  s/p IV fluids in the emergency room.  Hypertension:  --continue home amlodipine, lasix, hydralazine and metoprolol  Chronic back and lower extremity pain:  Continue home opioids, Lyrica and Robaxin  Hyperkalemia --K+ increased from 4.1 to 5.7 this morning.  Unknown etiology. --Kayexalate x1 yesterday -K is 5.1   DVT prophylaxis: Lovenox SQ Code Status: Full code  Disposition Plan: Back to SNF today  CONSULTS OBTAINED:  Treatment Team:  Abbie Sons, MD  DRUG ALLERGIES:  No Known Allergies  DISCHARGE MEDICATIONS:   Allergies as of 10/07/2019   No Known Allergies     Medication List    STOP taking these medications   doxycycline 50 MG capsule Commonly known as: VIBRAMYCIN  lisinopril 10 MG tablet Commonly known as: ZESTRIL     TAKE these  medications   acetaminophen 325 MG tablet Commonly known as: TYLENOL Take 650 mg by mouth every 4 (four) hours as needed for mild pain or fever.   amLODipine 10 MG tablet Commonly known as: NORVASC Take 10 mg by mouth daily.   bisacodyl 5 MG EC tablet Commonly known as: DULCOLAX Take 10 mg by mouth daily.   cefdinir 300 MG capsule Commonly known as: OMNICEF Take 1 capsule (300 mg total) by mouth every 12 (twelve) hours.   docusate sodium 100 MG capsule Commonly known as: COLACE Take 100 mg by mouth 2 (two) times daily.   famotidine 20 MG tablet Commonly known as: PEPCID Take 20 mg by mouth daily.   ferrous gluconate 324 MG tablet Commonly known as: FERGON Take 324 mg by mouth 2 (two) times daily with a meal.   furosemide 40 MG tablet Commonly known as: LASIX Take 40 mg by mouth 2 (two) times daily.   hydrALAZINE 50 MG tablet Commonly known as: APRESOLINE Take 50 mg by mouth 3 (three) times daily.   insulin glargine 100 UNIT/ML injection Commonly known as: LANTUS Inject 20 Units into the skin at bedtime.   insulin lispro 100 UNIT/ML injection Commonly known as: HUMALOG Inject 0-15 Units into the skin 4 (four) times daily -  before meals and at bedtime. <150= 0 units 151-200= 3 units 201-250= 6 units 251-300= 9 units 301-350= 12 units 351-400= 15 units >400 call MD   loratadine 10 MG tablet Commonly known as: CLARITIN Take 10 mg by mouth daily.   magnesium hydroxide 400 MG/5ML suspension Commonly known as: MILK OF MAGNESIA Take 30 mLs by mouth daily as needed for mild constipation.   Melatonin 5 MG Tabs Take 5 mg by mouth at bedtime.   methocarbamol 500 MG tablet Commonly known as: ROBAXIN Take 1,000 mg by mouth 4 (four) times daily.   metoprolol succinate 25 MG 24 hr tablet Commonly known as: TOPROL-XL Take 25 mg by mouth daily.   ondansetron 4 MG tablet Commonly known as: ZOFRAN Take 4 mg by mouth every 6 (six) hours as needed for nausea or  vomiting.   oxyCODONE ER 9 MG C12a Take 9 mg by mouth 2 (two) times daily.   Oxycodone HCl 10 MG Tabs Take 1 tablet (10 mg total) by mouth every 4 (four) hours as needed. What changed: reasons to take this   polyethylene glycol 17 g packet Commonly known as: MIRALAX / GLYCOLAX Take 17 g by mouth daily.   pregabalin 75 MG capsule Commonly known as: LYRICA Take 75 mg by mouth 2 (two) times daily.   sodium phosphate Pediatric 3.5-9.5 GM/59ML enema Place 1 enema rectally every other day as needed for severe constipation.   traZODone 50 MG tablet Commonly known as: DESYREL Take 50 mg by mouth at bedtime.       If you experience worsening of your admission symptoms, develop shortness of breath, life threatening emergency, suicidal or homicidal thoughts you must seek medical attention immediately by calling 911 or calling your MD immediately  if symptoms less severe.  You Must read complete instructions/literature along with all the possible adverse reactions/side effects for all the Medicines you take and that have been prescribed to you. Take any new Medicines after you have completely understood and accept all the possible adverse reactions/side effects.   Please note  You were cared for by a hospitalist during your hospital  stay. If you have any questions about your discharge medications or the care you received while you were in the hospital after you are discharged, you can call the unit and asked to speak with the hospitalist on call if the hospitalist that took care of you is not available. Once you are discharged, your primary care physician will handle any further medical issues. Please note that NO REFILLS for any discharge medications will be authorized once you are discharged, as it is imperative that you return to your primary care physician (or establish a relationship with a primary care physician if you do not have one) for your aftercare needs so that they can reassess  your need for medications and monitor your lab values. Today   SUBJECTIVE   No new complaints Overall doing well  VITAL SIGNS:  Blood pressure 115/66, pulse 62, temperature 98.5 F (36.9 C), temperature source Oral, resp. rate 18, height 6' (1.829 m), weight 111.3 kg, SpO2 98 %.  I/O:    Intake/Output Summary (Last 24 hours) at 10/07/2019 1607 Last data filed at 10/07/2019 1541 Gross per 24 hour  Intake --  Output 3301 ml  Net -3301 ml    PHYSICAL EXAMINATION:  GENERAL:  59 y.o.-year-old patient lying in the bed with no acute distress.  EYES: Pupils equal, round, reactive to light and accommodation. No scleral icterus. Extraocular muscles intact.  HEENT: Head atraumatic, normocephalic. Oropharynx and nasopharynx clear.  NECK:  Supple, no jugular venous distention. No thyroid enlargement, no tenderness.  LUNGS: Normal breath sounds bilaterally, no wheezing, rales,rhonchi or crepitation. No use of accessory muscles of respiration.  CARDIOVASCULAR: S1, S2 normal. No murmurs, rubs, or gallops.  ABDOMEN: Soft, non-tender, non-distended. Bowel sounds present. No organomegaly or mass. Supra pubic catheter + EXTREMITIES: No pedal edema, cyanosis, or clubbing.  NEUROLOGIC: Cranial nerves II through XII are intact. Muscle strength 5/5 in all extremities. Sensation intact. Gait not checked.  PSYCHIATRIC: The patient is alert and oriented x 3.  SKIN: No obvious rash, lesion, or ulcer.   DATA REVIEW:   CBC  Recent Labs  Lab 10/07/19 0247  WBC 5.2  HGB 10.8*  HCT 32.4*  PLT 222    Chemistries  Recent Labs  Lab 10/07/19 0247  NA 137  K 5.1  CL 99  CO2 31  GLUCOSE 151*  BUN 26*  CREATININE 1.21  CALCIUM 8.8*  MG 2.2    Microbiology Results   Recent Results (from the past 240 hour(s))  Urine Culture     Status: Abnormal   Collection Time: 10/02/19  6:49 AM   Specimen: Urine, Random  Result Value Ref Range Status   Specimen Description   Final    URINE,  RANDOM Performed at [facility], [address] [place], [place] [address]    Special Requests   Final    NONE Performed at [facility], [address] [place], [place] [address]    Culture >=100,000 COLONIES/mL PROVIDENCIA STUARTII (A)  Final   Report Status 10/06/2019 FINAL  Final   Organism ID, Bacteria PROVIDENCIA STUARTII (A)  Final      Susceptibility   Providencia stuartii - MIC*    AMPICILLIN >=32 RESISTANT Resistant     CEFAZOLIN >=64 RESISTANT Resistant     CEFTRIAXONE <=0.25 SENSITIVE Sensitive     CIPROFLOXACIN >=4 RESISTANT Resistant     GENTAMICIN RESISTANT Resistant     IMIPENEM 2 SENSITIVE Sensitive     NITROFURANTOIN 128 RESISTANT Resistant  TRIMETH/SULFA >=320 RESISTANT Resistant     AMPICILLIN/SULBACTAM >=32 RESISTANT Resistant     PIP/TAZO <=4 SENSITIVE Sensitive     * >=100,000 COLONIES/mL PROVIDENCIA STUARTII  Blood culture (routine x 2)     Status: None   Collection Time: 10/02/19  7:55 AM   Specimen: BLOOD  Result Value Ref Range Status   Specimen Description BLOOD RFA  Final   Special Requests   Final    BOTTLES DRAWN AEROBIC AND ANAEROBIC Blood Culture adequate volume   Culture   Final    NO GROWTH 5 DAYS Performed at [facility], [address] [place], [place] [address]    Report Status 10/07/2019 FINAL  Final  MRSA PCR Screening     Status: None   Collection Time: 10/03/19  5:33 AM   Specimen: Nasal Mucosa; Nasopharyngeal  Result Value Ref Range Status   MRSA by PCR NEGATIVE NEGATIVE Final    Comment:        The GeneXpert MRSA Assay (FDA approved for NASAL specimens only), is one component of a comprehensive MRSA colonization surveillance program. It is not intended to diagnose MRSA infection nor to guide or monitor treatment for MRSA infections. Performed at [facility], [address] [place], [place] [address]   SARS CORONAVIRUS 2 (TAT 6-24 HRS) Nasopharyngeal Nasopharyngeal Swab      Status: None   Collection Time: 10/05/19  5:09 PM   Specimen: Nasopharyngeal Swab  Result Value Ref Range Status   SARS Coronavirus 2 NEGATIVE NEGATIVE Final    Comment: (NOTE) SARS-CoV-2 target nucleic acids are NOT DETECTED. The SARS-CoV-2 RNA is generally detectable in upper and lower respiratory specimens during the acute phase of infection. Negative results do not preclude SARS-CoV-2 infection, do not rule out co-infections with other pathogens, and should not be used as the sole basis for treatment or other patient management decisions. Negative results must be combined with clinical observations, patient history, and epidemiological information. The expected result is Negative. Fact Sheet for Patients: HairSlick.no Fact Sheet for Healthcare Providers: quierodirigir.com This test is not yet approved or cleared by the [place] FDA and  has been authorized for detection and/or diagnosis of SARS-CoV-2 by FDA under an Emergency Use Authorization (EUA). This EUA will remain  in effect (meaning this test can be used) for the duration of the COVID-19 declaration under Section 56 4(b)(1) of the Act, 21 U.S.C. section 360bbb-3(b)(1), unless the authorization is terminated or revoked sooner. Performed at [facility], [address] [place], [place] [address]   Respiratory Panel by RT PCR (Flu A&B, Covid) - Nasopharyngeal Swab     Status: Abnormal   Collection Time: 10/07/19  1:30 PM   Specimen: Nasopharyngeal Swab  Result Value Ref Range Status   SARS Coronavirus 2 by RT PCR POSITIVE (A) NEGATIVE Final    Comment: RESULT CALLED TO, READ BACK BY AND VERIFIED WITH:  Deri Fuelling on 10/07/19 at 1448 CST (NOTE) SARS-CoV-2 target nucleic acids are DETECTED. SARS-CoV-2 RNA is generally detectable in upper respiratory specimens  during the acute phase of infection. Positive results are indicative of the presence of the  identified virus, but do not rule out bacterial infection or co-infection with other pathogens not detected by the test. Clinical correlation with patient history and other diagnostic information is necessary to determine patient infection status. The expected result is Negative. Fact Sheet for Patients:  https://www.moore.com/ Fact Sheet for Healthcare Providers: https://www.young.biz/ This test is not yet approved or  cleared by the [place] and  has been authorized for detection and/or diagnosis of SARS-CoV-2 by FDA under an Emergency Use Authorization (EUA).  This EUA will remain in effect (meaning this test can be used ) for the duration of  the COVID-19 declaration under Section 564(b)(1) of the Act, 21 U.S.C. section 360bbb-3(b)(1), unless the authorization is terminated or revoked sooner.    Influenza A by PCR NEGATIVE NEGATIVE Final   Influenza B by PCR NEGATIVE NEGATIVE Final    Comment: (NOTE) The Xpert Xpress SARS-CoV-2/FLU/RSV assay is intended as an aid in  the diagnosis of influenza from Nasopharyngeal swab specimens and  should not be used as a sole basis for treatment. Nasal washings and  aspirates are unacceptable for Xpert Xpress SARS-CoV-2/FLU/RSV  testing. Fact Sheet for Patients: https://www.moore.com/ Fact Sheet for Healthcare Providers: https://www.young.biz/ This test is not yet approved or cleared by the [place] FDA and  has been authorized for detection and/or diagnosis of SARS-CoV-2 by  FDA under an Emergency Use Authorization (EUA). This EUA will remain  in effect (meaning this test can be used) for the duration of the  Covid-19 declaration under Section 564(b)(1) of the Act, 21  U.S.C. section 360bbb-3(b)(1), unless the authorization is  terminated or revoked. Performed at [facility], [address] [place], [place] [address]     RADIOLOGY:  [place]  Intraoperative  Result Date: 10/07/2019 CLINICAL DATA:  Ultrasound was provided for use by the ordering physician, and a technical charge was applied by the performing facility.  No radiologist interpretation/professional services rendered.     CODE STATUS:     Code Status Orders  (From admission, onward)         Start     Ordered   10/02/19 0910  Full code  Continuous     10/02/19 0909        Code Status History    Date Active Date Inactive Code Status Order ID Comments User Context   02/26/2019 0412 03/04/2019 1946 Full Code [id number]  [facility] Vernetta Honey, MD Inpatient   02/07/2019 0234 02/10/2019 1819 Full Code [id number]  Oralia Manis, MD Inpatient   Advance Care Planning Activity       TOTAL TIME TAKING CARE OF THIS PATIENT: *40* minutes.    Enedina Finner M.D on 10/07/2019 at 4:07 PM  Between 7am to 6pm - Pager - [phone] After 6pm go to [email or web address] - password [email or web address]  [facility]    CC: Primary care physician; System, Pcp Not In

## 2019-10-08 NOTE — Telephone Encounter (Signed)
Per Dr. Richardo Hanks, no post-discharge follow-up needed. Patient should keep his scheduled monthly visits with IR to upsize his SP tube; he has a follow-up scheduled with Dr. Richardo Hanks in March 2021 to discuss UDS that he should keep. He can follow-up with [place] in the meantime as needed.

## 2019-10-09 ENCOUNTER — Other Ambulatory Visit: Payer: Self-pay | Admitting: Radiology

## 2019-10-09 DIAGNOSIS — N368 Other specified disorders of urethra: Secondary | ICD-10-CM

## 2019-10-09 DIAGNOSIS — T8389XS Other specified complication of genitourinary prosthetic devices, implants and grafts, sequela: Secondary | ICD-10-CM

## 2019-10-09 DIAGNOSIS — N319 Neuromuscular dysfunction of bladder, unspecified: Secondary | ICD-10-CM

## 2019-10-12 ENCOUNTER — Encounter: Payer: Self-pay | Admitting: Emergency Medicine

## 2019-10-12 ENCOUNTER — Emergency Department
Admission: EM | Admit: 2019-10-12 | Discharge: 2019-10-12 | Disposition: A | Discharge status: Skilled Nursing Facility | Payer: Medicaid Other | Attending: Student | Admitting: Student

## 2019-10-12 ENCOUNTER — Other Ambulatory Visit: Payer: Self-pay

## 2019-10-12 DIAGNOSIS — Z794 Long term (current) use of insulin: Secondary | ICD-10-CM | POA: Diagnosis not present

## 2019-10-12 DIAGNOSIS — R112 Nausea with vomiting, unspecified: Secondary | ICD-10-CM | POA: Diagnosis present

## 2019-10-12 DIAGNOSIS — K3184 Gastroparesis: Secondary | ICD-10-CM | POA: Diagnosis not present

## 2019-10-12 DIAGNOSIS — E119 Type 2 diabetes mellitus without complications: Secondary | ICD-10-CM | POA: Insufficient documentation

## 2019-10-12 DIAGNOSIS — F17210 Nicotine dependence, cigarettes, uncomplicated: Secondary | ICD-10-CM | POA: Insufficient documentation

## 2019-10-12 DIAGNOSIS — Z79899 Other long term (current) drug therapy: Secondary | ICD-10-CM | POA: Diagnosis not present

## 2019-10-12 LAB — COMPREHENSIVE METABOLIC PANEL
ALT: 32 U/L (ref 0–44)
AST: 17 U/L (ref 15–41)
Albumin: 4 g/dL (ref 3.5–5.0)
Alkaline Phosphatase: 87 U/L (ref 38–126)
Anion gap: 15 (ref 5–15)
BUN: 26 mg/dL — ABNORMAL HIGH (ref 6–20)
CO2: 23 mmol/L (ref 22–32)
Calcium: 9.5 mg/dL (ref 8.9–10.3)
Chloride: 100 mmol/L (ref 98–111)
Creatinine, Ser: 1.4 mg/dL — ABNORMAL HIGH (ref 0.61–1.24)
GFR calc Af Amer: >60 mL/min (ref >60)
GFR calc non Af Amer: 56 mL/min — ABNORMAL LOW (ref >60)
Glucose, Bld: 419 mg/dL — ABNORMAL HIGH (ref 70–99)
Potassium: 4.7 mmol/L (ref 3.5–5.1)
Sodium: 138 mmol/L (ref 135–145)
Total Bilirubin: 1 mg/dL (ref 0.3–1.2)
Total Protein: 8.7 g/dL — ABNORMAL HIGH (ref 6.5–8.1)

## 2019-10-12 LAB — CBC
HCT: 37 % — ABNORMAL LOW (ref 39.0–52.0)
Hemoglobin: 12.7 g/dL — ABNORMAL LOW (ref 13.0–17.0)
MCH: 30.2 pg (ref 26.0–34.0)
MCHC: 34.3 g/dL (ref 30.0–36.0)
MCV: 87.9 fL (ref 80.0–100.0)
Platelets: 318 10*3/uL (ref 150–400)
RBC: 4.21 MIL/uL — ABNORMAL LOW (ref 4.22–5.81)
RDW: 12.7 % (ref 11.5–15.5)
WBC: 11 10*3/uL — ABNORMAL HIGH (ref 4.0–10.5)
nRBC: 0 % (ref 0.0–0.2)

## 2019-10-12 LAB — GLUCOSE, CAPILLARY: Glucose-Capillary: 393 mg/dL — ABNORMAL HIGH (ref 70–99)

## 2019-10-12 LAB — LIPASE, BLOOD: Lipase: 15 U/L (ref 11–51)

## 2019-10-12 MED ORDER — SODIUM CHLORIDE 0.9% FLUSH: 3.0000 mL | Freq: Once | INTRAVENOUS | Status: DC

## 2019-10-12 MED ORDER — ONDANSETRON HCL 4 MG/2ML IJ SOLN
4.0000 mg | Freq: Once | INTRAMUSCULAR | Status: DC
  Filled 2019-10-12: qty 2

## 2019-10-12 MED ORDER — SODIUM CHLORIDE 0.9 % IV BOLUS
1000.0000 mL | Freq: Once | INTRAVENOUS | Status: AC
  Administered 2019-10-12: 1000 mL via INTRAVENOUS

## 2019-10-12 MED ORDER — ONDANSETRON HCL 4 MG/2ML IJ SOLN
4.0000 mg | Freq: Once | INTRAMUSCULAR | Status: AC
  Administered 2019-10-12: 4 mg via INTRAVENOUS
  Filled 2019-10-12: qty 2

## 2019-10-12 MED ORDER — INSULIN REGULAR HUMAN 100 UNIT/ML IJ SOLN
4.0000 [IU] | Freq: Once | INTRAMUSCULAR | Status: AC
  Administered 2019-10-12: 4 [IU] via INTRAVENOUS
  Filled 2019-10-12: qty 10

## 2019-10-12 MED ORDER — METOCLOPRAMIDE HCL 10 MG PO TABS: 10.0000 mg | ORAL_TABLET | Freq: Three times a day (TID) | ORAL | 0 refills | Status: DC | PRN

## 2019-10-12 MED ORDER — MORPHINE SULFATE (PF) 4 MG/ML IV SOLN
4.0000 mg | Freq: Once | INTRAVENOUS | Status: AC
  Administered 2019-10-12: 4 mg via INTRAVENOUS
  Filled 2019-10-12: qty 1

## 2019-10-12 MED ORDER — METOCLOPRAMIDE HCL 5 MG/ML IJ SOLN
10.0000 mg | Freq: Once | INTRAMUSCULAR | Status: AC
  Administered 2019-10-12: 10 mg via INTRAVENOUS
  Filled 2019-10-12: qty 2

## 2019-10-12 MED ORDER — ONDANSETRON HCL 4 MG PO TABS: 4.0000 mg | ORAL_TABLET | Freq: Three times a day (TID) | ORAL | 0 refills | Status: DC | PRN

## 2019-10-12 NOTE — ED Notes (Signed)
[facility]  CALLED  FOR  TRANSPORT 

## 2019-10-12 NOTE — ED Notes (Signed)
Physical copy of pt's discharge sheet was signed and placed in box to go to records.

## 2019-10-12 NOTE — ED Triage Notes (Signed)
Pt in via [employer] for [place]. EMS reports pt has GERD and hx of the same and is requesting IV zofran. EMS reports pt was tested last Tuesday for COVID and was negative.

## 2019-10-12 NOTE — ED Provider Notes (Signed)
[facility] Emergency Department Provider Note  ____________________________________________   First MD Initiated Contact with Patient 10/12/19 1308     (approximate)  I have reviewed the triage vital signs and the nursing notes.  History  Chief Complaint Abdominal Pain and Emesis    HPI Eugene [name] is a 59 y.o. male with history hepatitis C, spinal stenosis, HLD, DM, bilateral below knee amputation, chronic urinary retention with indwelling suprapubic catheter who presents to the emergency department for nausea and vomiting.  Patient states symptoms are similar to prior exacerbations of his diabetic gastroparesis, which flares up about every 6 months or so.  He states symptoms started last night.  He has had greater than 10 episodes of nonbloody, nonbilious emesis.  He reports associated generalized abdominal cramping/aching due to the retching and dry heaving.  Discomfort is generalized in location, without any localizing component.  Mild in severity.  No radiation, no alleviating or aggravating components.  He does report over a week of loose stool as well, in the setting of being COVID positive.  He denies any fevers, cough, difficulty breathing.  Of note, patient was recently admitted from 1/8-1/13 for displacement of his Foley catheter and sepsis due to UTI.  He reports compliance with his discharge antibiotics.   Past Medical Hx Past Medical History:  Diagnosis Date  . Anxiety   . Depression   . Diabetes mellitus without complication (HCC)   . Hepatitis C   . Hyperlipidemia   . IBS (irritable bowel syndrome)   . Osteomyelitis (HCC)   . Spinal stenosis     Problem List Patient Active Problem List   Diagnosis Date Noted  . Suprapubic catheter (HCC)   . Sepsis (HCC) 10/02/2019  . MRSA bacteremia 02/08/2019  . Osteomyelitis of thoracic spine (HCC) 02/08/2019  . Peripheral vascular disease (HCC) 02/08/2019  . Hypertension 02/08/2019  .  Dyslipidemia 02/08/2019  . Chronic pain syndrome 02/08/2019  . Polysubstance abuse (HCC) 02/08/2019  . Chronic hepatitis C without hepatic coma (HCC) 02/08/2019  . GERD (gastroesophageal reflux disease) 02/08/2019  . Cigarette smoker 02/08/2019  . Normocytic anemia 02/08/2019  . UTI (urinary tract infection) 02/07/2019  . AKI (acute kidney injury) (HCC) 02/07/2019  . Hyperkalemia 02/07/2019  . Diabetes (HCC) 02/07/2019  . Pressure injury of skin 02/07/2019  . Hyperlipidemia 11/07/2018  . MRSA (methicillin resistant Staphylococcus aureus) septicemia (HCC) 08/17/2017  . Other acute osteomyelitis, left ankle and foot (HCC) 06/29/2017  . Diabetic ulcer of right foot associated with diabetes mellitus due to underlying condition (HCC) 06/27/2017    Past Surgical Hx Past Surgical History:  Procedure Laterality Date  . bilateral amputation Bilateral   . CENTRAL LINE INSERTION-TUNNELED N/A 02/02/2019   Procedure: CENTRAL LINE INSERTION-TUNNELED;  Surgeon: Annice Needy, MD;  Location: [facility] INVASIVE CV LAB;  Service: Cardiovascular;  Laterality: N/A;  . SPINAL FUSION    . THORACIC SPINE SURGERY  01/2019   extensive washout    Medications Prior to Admission medications   Medication Sig Start Date End Date Taking? Authorizing Provider  acetaminophen (TYLENOL) 325 MG tablet Take 650 mg by mouth every 4 (four) hours as needed for mild pain or fever.    [provider]  amLODipine (NORVASC) 10 MG tablet Take 10 mg by mouth daily.    [provider]  bisacodyl (DULCOLAX) 5 MG EC tablet Take 10 mg by mouth daily.    [provider]  cefdinir (OMNICEF) 300 MG capsule Take 1 capsule (300 mg  total) by mouth every 12 (twelve) hours. 10/07/19   Fritzi Mandes, MD  docusate sodium (COLACE) 100 MG capsule Take 100 mg by mouth 2 (two) times daily.    [provider]  famotidine (PEPCID) 20 MG tablet Take 20 mg by mouth daily.     [provider]  ferrous gluconate  (FERGON) 324 MG tablet Take 324 mg by mouth 2 (two) times daily with a meal.     [provider]  furosemide (LASIX) 40 MG tablet Take 40 mg by mouth 2 (two) times daily.    [provider]  hydrALAZINE (APRESOLINE) 50 MG tablet Take 50 mg by mouth 3 (three) times daily.    [provider]  insulin glargine (LANTUS) 100 UNIT/ML injection Inject 20 Units into the skin at bedtime.     [provider]  insulin lispro (HUMALOG) 100 UNIT/ML injection Inject 0-15 Units into the skin 4 (four) times daily -  before meals and at bedtime. <150= 0 units 151-200= 3 units 201-250= 6 units 251-300= 9 units 301-350= 12 units 351-400= 15 units >400 call MD    [provider]  loratadine (CLARITIN) 10 MG tablet Take 10 mg by mouth daily.    [provider]  magnesium hydroxide (MILK OF MAGNESIA) 400 MG/5ML suspension Take 30 mLs by mouth daily as needed for mild constipation. 03/04/19   Epifanio Lesches, MD  Melatonin 5 MG TABS Take 5 mg by mouth at bedtime.    [provider]  methocarbamol (ROBAXIN) 500 MG tablet Take 1,000 mg by mouth 4 (four) times daily.     [provider]  metoprolol succinate (TOPROL-XL) 25 MG 24 hr tablet Take 25 mg by mouth daily.    [provider]  ondansetron (ZOFRAN) 4 MG tablet Take 4 mg by mouth every 6 (six) hours as needed for nausea or vomiting.    [provider]  oxyCODONE ER 9 MG C12A Take 9 mg by mouth 2 (two) times daily. 10/07/19   Fritzi Mandes, MD  Oxycodone HCl 10 MG TABS Take 1 tablet (10 mg total) by mouth every 4 (four) hours as needed. 10/07/19   Fritzi Mandes, MD  polyethylene glycol (MIRALAX / GLYCOLAX) 17 g packet Take 17 g by mouth daily.    [provider]  pregabalin (LYRICA) 75 MG capsule Take 75 mg by mouth 2 (two) times daily.    [provider]  sodium phosphate Pediatric (FLEET) 3.5-9.5 GM/59ML enema Place 1 enema rectally every other day as needed  for severe constipation.    [provider]  traZODone (DESYREL) 50 MG tablet Take 50 mg by mouth at bedtime.     [provider]    Allergies Patient has no known allergies.  Family Hx No family history on file.  Social Hx Social History   Tobacco Use  . Smoking status: Current Every Day Smoker    Packs/day: 0.25    Types: Cigarettes  . Smokeless tobacco: Never Used  Substance Use Topics  . Alcohol use: Not Currently  . Drug use: Not Currently     Review of Systems  Constitutional: Negative for fever, chills. Eyes: Negative for visual changes. ENT: Negative for sore throat. Cardiovascular: Negative for chest pain. Respiratory: Negative for shortness of breath. Gastrointestinal: + for nausea, vomiting, abdominal aching.  Genitourinary: Negative for dysuria. Musculoskeletal: Negative for leg swelling. Skin: Negative for rash. Neurological: Negative for for headaches.   Physical Exam  Vital Signs: ED Triage Vitals  Enc Vitals Group     BP 10/12/19 1051 (!) 141/84     Pulse Rate 10/12/19 1051 (!) 112     Resp 10/12/19 1051 19     Temp 10/12/19 1051 98 F (36.7 C)     Temp Source 10/12/19 1051 Oral     SpO2 10/12/19 1051 99 %     Weight 10/12/19 1050 240 lb (108.9 kg)     Height 10/12/19 1050 6' (1.829 m)     Head Circumference --      Peak Flow --      Pain Score 10/12/19 1114 10     Pain Loc --      Pain Edu? --      Excl. in GC? --     Constitutional: Alert and oriented.  Head: Normocephalic. Atraumatic. Eyes: Conjunctivae clear. Sclera anicteric. Nose: No congestion. No rhinorrhea. Mouth/Throat: Wearing mask.  MM dry. Neck: No stridor.   Cardiovascular: HR 110s. Extremities well perfused. Respiratory: Normal respiratory effort.  Lungs CTAB. Gastrointestinal: Soft. Non-tender throughout to deep palpation. Non-distended.  Suprapubic catheter site clean, dry, no drainage or surrounding erythema. Musculoskeletal: Bilateral below knee  amputations. Neurologic:  Normal speech and language. No gross focal neurologic deficits are appreciated.  Skin: Skin is warm, dry and intact. No rash noted. Psychiatric: Mood and affect are appropriate for situation.  Procedures  Procedure(s) performed (including critical care):  Procedures   Initial Impression / Assessment and Plan / ED Course  59 y.o. male who presents to the ED for nausea, vomiting, similar to prior episodes of diabetic gastroparesis  Ddx: gastroparesis, COVID related, other viral GI process. Abdominal exam is reassuring, no tenderness, no distension, doubt obstruction. Less likely UTI as he has been compliant w/ his recent antibiotics. Do not feel repeat urine testing indicated at this time as he is still finishing his antibiotic course today.   Labs reveal mild AKI, creatinine 1.4 from 1.2 previously.  Hyperglycemia to 419, without anion gap or evidence of DKA.  We will plan for IV, symptomatic treatment, reassess. If improves and able to tolerate PO, anticipate discharge.    Final Clinical Impression(s) / ED Diagnosis  Final diagnoses:  Gastroparesis       Note:  This document was prepared using Dragon voice recognition software and may include unintentional dictation errors.   Miguel Aschoff., MD 10/12/19 [phone]

## 2019-10-12 NOTE — ED Triage Notes (Signed)
Lower abd pain and nausea wit vomiting since yesterday evening.

## 2019-10-12 NOTE — ED Notes (Signed)
Pt given lunch tray per Dr. Colon Branch for PO challenge

## 2019-10-12 NOTE — ED Provider Notes (Signed)
Patient did feel some improvement after nausea medication. Was able to tolerate PO. Will plan on discharging. Will give prescription for nausea medication. Discussed with patient importance of monitoring blood sugar levels.    Phineas Semen, MD 10/12/19 Windell Moment

## 2019-10-12 NOTE — Discharge Instructions (Addendum)
Thank you for letting us take care of you in the emergency department today.  ° °Please continue to take any regular, prescribed medications.  ° °Please follow up with: °- Your primary care doctor to review your ER visit and follow up on your symptoms.  ° °Please return to the ER for any new or worsening symptoms.  ° °

## 2019-10-21 ENCOUNTER — Emergency Department: Payer: Medicaid Other

## 2019-10-21 ENCOUNTER — Other Ambulatory Visit: Payer: Self-pay

## 2019-10-21 ENCOUNTER — Inpatient Hospital Stay
Admission: EM | Admit: 2019-10-21 | Discharge: 2019-10-24 | DRG: 074 | Disposition: A | Discharge status: Skilled Nursing Facility | Payer: Medicaid Other | Source: Skilled Nursing Facility | Attending: Family Medicine | Admitting: Family Medicine

## 2019-10-21 DIAGNOSIS — L89152 Pressure ulcer of sacral region, stage 2: Secondary | ICD-10-CM | POA: Diagnosis present

## 2019-10-21 DIAGNOSIS — K5903 Drug induced constipation: Secondary | ICD-10-CM

## 2019-10-21 DIAGNOSIS — G8929 Other chronic pain: Secondary | ICD-10-CM | POA: Diagnosis present

## 2019-10-21 DIAGNOSIS — K5641 Fecal impaction: Secondary | ICD-10-CM | POA: Diagnosis present

## 2019-10-21 DIAGNOSIS — E785 Hyperlipidemia, unspecified: Secondary | ICD-10-CM | POA: Diagnosis present

## 2019-10-21 DIAGNOSIS — E1142 Type 2 diabetes mellitus with diabetic polyneuropathy: Secondary | ICD-10-CM | POA: Diagnosis present

## 2019-10-21 DIAGNOSIS — R339 Retention of urine, unspecified: Secondary | ICD-10-CM | POA: Diagnosis present

## 2019-10-21 DIAGNOSIS — F17210 Nicotine dependence, cigarettes, uncomplicated: Secondary | ICD-10-CM | POA: Diagnosis present

## 2019-10-21 DIAGNOSIS — R933 Abnormal findings on diagnostic imaging of other parts of digestive tract: Secondary | ICD-10-CM | POA: Diagnosis present

## 2019-10-21 DIAGNOSIS — Z981 Arthrodesis status: Secondary | ICD-10-CM

## 2019-10-21 DIAGNOSIS — Z89511 Acquired absence of right leg below knee: Secondary | ICD-10-CM

## 2019-10-21 DIAGNOSIS — B182 Chronic viral hepatitis C: Secondary | ICD-10-CM | POA: Diagnosis present

## 2019-10-21 DIAGNOSIS — I10 Essential (primary) hypertension: Secondary | ICD-10-CM | POA: Diagnosis present

## 2019-10-21 DIAGNOSIS — D649 Anemia, unspecified: Secondary | ICD-10-CM | POA: Diagnosis present

## 2019-10-21 DIAGNOSIS — E1151 Type 2 diabetes mellitus with diabetic peripheral angiopathy without gangrene: Secondary | ICD-10-CM | POA: Diagnosis present

## 2019-10-21 DIAGNOSIS — E1143 Type 2 diabetes mellitus with diabetic autonomic (poly)neuropathy: Principal | ICD-10-CM | POA: Diagnosis present

## 2019-10-21 DIAGNOSIS — K219 Gastro-esophageal reflux disease without esophagitis: Secondary | ICD-10-CM | POA: Diagnosis present

## 2019-10-21 DIAGNOSIS — Z79899 Other long term (current) drug therapy: Secondary | ICD-10-CM

## 2019-10-21 DIAGNOSIS — K3184 Gastroparesis: Secondary | ICD-10-CM | POA: Diagnosis present

## 2019-10-21 DIAGNOSIS — Z794 Long term (current) use of insulin: Secondary | ICD-10-CM

## 2019-10-21 DIAGNOSIS — R112 Nausea with vomiting, unspecified: Secondary | ICD-10-CM | POA: Diagnosis present

## 2019-10-21 DIAGNOSIS — K589 Irritable bowel syndrome without diarrhea: Secondary | ICD-10-CM | POA: Diagnosis present

## 2019-10-21 DIAGNOSIS — Z89512 Acquired absence of left leg below knee: Secondary | ICD-10-CM

## 2019-10-21 DIAGNOSIS — Z8616 Personal history of COVID-19: Secondary | ICD-10-CM

## 2019-10-21 DIAGNOSIS — R1084 Generalized abdominal pain: Secondary | ICD-10-CM

## 2019-10-21 DIAGNOSIS — Z79891 Long term (current) use of opiate analgesic: Secondary | ICD-10-CM

## 2019-10-21 DIAGNOSIS — E1165 Type 2 diabetes mellitus with hyperglycemia: Secondary | ICD-10-CM

## 2019-10-21 LAB — CBC WITH DIFFERENTIAL/PLATELET
Abs Immature Granulocytes: 0.05 10*3/uL (ref 0.00–0.07)
Basophils Absolute: 0.1 10*3/uL (ref 0.0–0.1)
Basophils Relative: 0 %
Eosinophils Absolute: 0 10*3/uL (ref 0.0–0.5)
Eosinophils Relative: 0 %
HCT: 37.9 % — ABNORMAL LOW (ref 39.0–52.0)
Hemoglobin: 13 g/dL (ref 13.0–17.0)
Immature Granulocytes: 0 %
Lymphocytes Relative: 10 %
Lymphs Abs: 1.3 10*3/uL (ref 0.7–4.0)
MCH: 30.7 pg (ref 26.0–34.0)
MCHC: 34.3 g/dL (ref 30.0–36.0)
MCV: 89.4 fL (ref 80.0–100.0)
Monocytes Absolute: 0.6 10*3/uL (ref 0.1–1.0)
Monocytes Relative: 5 %
Neutro Abs: 11.4 10*3/uL — ABNORMAL HIGH (ref 1.7–7.7)
Neutrophils Relative %: 85 %
Platelets: 243 10*3/uL (ref 150–400)
RBC: 4.24 MIL/uL (ref 4.22–5.81)
RDW: 13 % (ref 11.5–15.5)
WBC: 13.4 10*3/uL — ABNORMAL HIGH (ref 4.0–10.5)
nRBC: 0 % (ref 0.0–0.2)

## 2019-10-21 LAB — COMPREHENSIVE METABOLIC PANEL
ALT: 37 U/L (ref 0–44)
AST: 31 U/L (ref 15–41)
Albumin: 4 g/dL (ref 3.5–5.0)
Alkaline Phosphatase: 71 U/L (ref 38–126)
Anion gap: 13 (ref 5–15)
BUN: 24 mg/dL — ABNORMAL HIGH (ref 6–20)
CO2: 24 mmol/L (ref 22–32)
Calcium: 9.4 mg/dL (ref 8.9–10.3)
Chloride: 102 mmol/L (ref 98–111)
Creatinine, Ser: 1.33 mg/dL — ABNORMAL HIGH (ref 0.61–1.24)
GFR calc Af Amer: >60 mL/min (ref >60)
GFR calc non Af Amer: 59 mL/min — ABNORMAL LOW (ref >60)
Glucose, Bld: 296 mg/dL — ABNORMAL HIGH (ref 70–99)
Potassium: 4 mmol/L (ref 3.5–5.1)
Sodium: 139 mmol/L (ref 135–145)
Total Bilirubin: 1.3 mg/dL — ABNORMAL HIGH (ref 0.3–1.2)
Total Protein: 8.3 g/dL — ABNORMAL HIGH (ref 6.5–8.1)

## 2019-10-21 LAB — GLUCOSE, CAPILLARY: Glucose-Capillary: 300 mg/dL — ABNORMAL HIGH (ref 70–99)

## 2019-10-21 LAB — LIPASE, BLOOD: Lipase: 13 U/L (ref 11–51)

## 2019-10-21 MED ORDER — BISACODYL 10 MG RE SUPP
10.0000 mg | Freq: Once | RECTAL | Status: AC
  Administered 2019-10-21: 10 mg via RECTAL
  Filled 2019-10-21: qty 1

## 2019-10-21 MED ORDER — MINERAL OIL RE ENEM: 1.0000 | ENEMA | Freq: Every day | RECTAL | Status: DC | PRN

## 2019-10-21 MED ORDER — METOCLOPRAMIDE HCL 5 MG/ML IJ SOLN
10.0000 mg | Freq: Once | INTRAMUSCULAR | Status: AC
  Administered 2019-10-21: 14:00:00 10 mg via INTRAVENOUS
  Filled 2019-10-21: qty 2

## 2019-10-21 MED ORDER — BISACODYL 5 MG PO TBEC
10.0000 mg | DELAYED_RELEASE_TABLET | Freq: Every day | ORAL | Status: DC
  Administered 2019-10-22 – 2019-10-24 (×3): 10 mg via ORAL
  Filled 2019-10-21 (×3): qty 2

## 2019-10-21 MED ORDER — PANTOPRAZOLE SODIUM 40 MG IV SOLR
40.0000 mg | Freq: Once | INTRAVENOUS | Status: AC
  Administered 2019-10-21: 40 mg via INTRAVENOUS
  Filled 2019-10-21: qty 40

## 2019-10-21 MED ORDER — FERROUS GLUCONATE 324 (38 FE) MG PO TABS
324.0000 mg | ORAL_TABLET | Freq: Two times a day (BID) | ORAL | Status: DC
  Administered 2019-10-23 – 2019-10-24 (×3): 324 mg via ORAL
  Filled 2019-10-21 (×6): qty 1

## 2019-10-21 MED ORDER — INSULIN ASPART 100 UNIT/ML ~~LOC~~ SOLN
0.0000 [IU] | SUBCUTANEOUS | Status: DC
  Administered 2019-10-21 – 2019-10-22 (×2): 5 [IU] via SUBCUTANEOUS
  Administered 2019-10-22: 2 [IU] via SUBCUTANEOUS
  Administered 2019-10-22: 5 [IU] via SUBCUTANEOUS
  Administered 2019-10-22: 3 [IU] via SUBCUTANEOUS
  Filled 2019-10-21 (×5): qty 1

## 2019-10-21 MED ORDER — METOCLOPRAMIDE HCL 5 MG/ML IJ SOLN
10.0000 mg | Freq: Four times a day (QID) | INTRAMUSCULAR | Status: DC
  Administered 2019-10-21 – 2019-10-22 (×3): 10 mg via INTRAVENOUS
  Filled 2019-10-21 (×3): qty 2

## 2019-10-21 MED ORDER — OXYCODONE HCL ER 10 MG PO T12A
10.0000 mg | EXTENDED_RELEASE_TABLET | Freq: Two times a day (BID) | ORAL | Status: DC
  Administered 2019-10-21 – 2019-10-24 (×6): 10 mg via ORAL
  Filled 2019-10-21 (×6): qty 1

## 2019-10-21 MED ORDER — SODIUM CHLORIDE 0.9 % IV SOLN: INTRAVENOUS | Status: DC

## 2019-10-21 MED ORDER — HYDRALAZINE HCL 50 MG PO TABS
50.0000 mg | ORAL_TABLET | Freq: Three times a day (TID) | ORAL | Status: DC
  Administered 2019-10-21 – 2019-10-24 (×6): 50 mg via ORAL
  Filled 2019-10-21 (×7): qty 1

## 2019-10-21 MED ORDER — IOHEXOL 300 MG/ML  SOLN
100.0000 mL | Freq: Once | INTRAMUSCULAR | Status: AC | PRN
  Administered 2019-10-21: 15:00:00 100 mL via INTRAVENOUS

## 2019-10-21 MED ORDER — FUROSEMIDE 40 MG PO TABS
40.0000 mg | ORAL_TABLET | Freq: Two times a day (BID) | ORAL | Status: DC
  Administered 2019-10-22 – 2019-10-23 (×2): 40 mg via ORAL
  Filled 2019-10-21 (×2): qty 1

## 2019-10-21 MED ORDER — DOCUSATE SODIUM 100 MG PO CAPS
100.0000 mg | ORAL_CAPSULE | Freq: Two times a day (BID) | ORAL | Status: DC
  Administered 2019-10-21 – 2019-10-24 (×6): 100 mg via ORAL
  Filled 2019-10-21 (×6): qty 1

## 2019-10-21 MED ORDER — LINACLOTIDE 145 MCG PO CAPS
145.0000 ug | ORAL_CAPSULE | Freq: Every day | ORAL | Status: DC
  Administered 2019-10-22 – 2019-10-24 (×3): 145 ug via ORAL
  Filled 2019-10-21 (×3): qty 1

## 2019-10-21 MED ORDER — DROPERIDOL 2.5 MG/ML IJ SOLN: 2.5000 mg | Freq: Once | INTRAMUSCULAR | Status: DC

## 2019-10-21 MED ORDER — AMLODIPINE BESYLATE 10 MG PO TABS
10.0000 mg | ORAL_TABLET | Freq: Every day | ORAL | Status: DC
  Administered 2019-10-22 – 2019-10-24 (×3): 10 mg via ORAL
  Filled 2019-10-21 (×3): qty 1

## 2019-10-21 MED ORDER — ENOXAPARIN SODIUM 40 MG/0.4ML ~~LOC~~ SOLN
40.0000 mg | SUBCUTANEOUS | Status: DC
  Administered 2019-10-21 – 2019-10-23 (×3): 40 mg via SUBCUTANEOUS
  Filled 2019-10-21 (×3): qty 0.4

## 2019-10-21 MED ORDER — POLYETHYLENE GLYCOL 3350 17 G PO PACK
17.0000 g | PACK | Freq: Every day | ORAL | Status: DC
  Administered 2019-10-22: 17 g via ORAL
  Filled 2019-10-21: qty 1

## 2019-10-21 MED ORDER — HALOPERIDOL LACTATE 5 MG/ML IJ SOLN
2.5000 mg | Freq: Once | INTRAMUSCULAR | Status: AC
  Administered 2019-10-21: 2.5 mg via INTRAVENOUS
  Filled 2019-10-21: qty 1

## 2019-10-21 MED ORDER — TRAZODONE HCL 50 MG PO TABS
50.0000 mg | ORAL_TABLET | Freq: Every day | ORAL | Status: DC
  Administered 2019-10-21 – 2019-10-23 (×3): 50 mg via ORAL
  Filled 2019-10-21 (×3): qty 1

## 2019-10-21 MED ORDER — MELATONIN 5 MG PO TABS
5.0000 mg | ORAL_TABLET | Freq: Every day | ORAL | Status: DC
  Administered 2019-10-21 – 2019-10-23 (×3): 5 mg via ORAL
  Filled 2019-10-21 (×3): qty 1

## 2019-10-21 MED ORDER — ACETAMINOPHEN 650 MG RE SUPP: 650.0000 mg | Freq: Four times a day (QID) | RECTAL | Status: DC | PRN

## 2019-10-21 MED ORDER — METHOCARBAMOL 500 MG PO TABS
1000.0000 mg | ORAL_TABLET | Freq: Four times a day (QID) | ORAL | Status: DC
  Administered 2019-10-22 – 2019-10-24 (×8): 1000 mg via ORAL
  Filled 2019-10-21 (×10): qty 2

## 2019-10-21 MED ORDER — OXYCODONE HCL 5 MG PO TABS
10.0000 mg | ORAL_TABLET | ORAL | Status: DC | PRN
  Administered 2019-10-23 – 2019-10-24 (×3): 10 mg via ORAL
  Filled 2019-10-21 (×3): qty 2

## 2019-10-21 MED ORDER — BISACODYL 10 MG RE SUPP: 10.0000 mg | Freq: Every day | RECTAL | Status: DC | PRN

## 2019-10-21 MED ORDER — ONDANSETRON HCL 4 MG/2ML IJ SOLN
4.0000 mg | Freq: Four times a day (QID) | INTRAMUSCULAR | Status: DC | PRN
  Administered 2019-10-21: 21:00:00 4 mg via INTRAVENOUS
  Filled 2019-10-21: qty 2

## 2019-10-21 MED ORDER — SODIUM CHLORIDE 0.9 % IV BOLUS
1000.0000 mL | Freq: Once | INTRAVENOUS | Status: AC
  Administered 2019-10-21: 14:00:00 1000 mL via INTRAVENOUS

## 2019-10-21 MED ORDER — SORBITOL 70 % SOLN
960.0000 mL | TOPICAL_OIL | Freq: Once | ORAL | Status: AC
  Administered 2019-10-21: 960 mL via RECTAL
  Filled 2019-10-21: qty 473

## 2019-10-21 MED ORDER — METOPROLOL SUCCINATE ER 25 MG PO TB24
25.0000 mg | ORAL_TABLET | Freq: Every day | ORAL | Status: DC
  Administered 2019-10-22 – 2019-10-24 (×3): 25 mg via ORAL
  Filled 2019-10-21 (×3): qty 1

## 2019-10-21 MED ORDER — FAMOTIDINE 20 MG PO TABS
20.0000 mg | ORAL_TABLET | Freq: Every day | ORAL | Status: DC
  Administered 2019-10-22 – 2019-10-24 (×3): 20 mg via ORAL
  Filled 2019-10-21 (×3): qty 1

## 2019-10-21 MED ORDER — LORATADINE 10 MG PO TABS
10.0000 mg | ORAL_TABLET | Freq: Every day | ORAL | Status: DC
  Administered 2019-10-22 – 2019-10-24 (×3): 10 mg via ORAL
  Filled 2019-10-21 (×3): qty 1

## 2019-10-21 MED ORDER — ACETAMINOPHEN 325 MG PO TABS
650.0000 mg | ORAL_TABLET | Freq: Four times a day (QID) | ORAL | Status: DC | PRN
  Administered 2019-10-22: 650 mg via ORAL
  Filled 2019-10-21: qty 2

## 2019-10-21 MED ORDER — PREGABALIN 75 MG PO CAPS
75.0000 mg | ORAL_CAPSULE | Freq: Two times a day (BID) | ORAL | Status: DC
  Administered 2019-10-21 – 2019-10-24 (×6): 75 mg via ORAL
  Filled 2019-10-21 (×6): qty 1

## 2019-10-21 MED ORDER — DIPHENHYDRAMINE HCL 50 MG/ML IJ SOLN
50.0000 mg | Freq: Once | INTRAMUSCULAR | Status: AC
  Administered 2019-10-21: 14:00:00 50 mg via INTRAVENOUS
  Filled 2019-10-21: qty 1

## 2019-10-21 MED ORDER — MORPHINE SULFATE (PF) 4 MG/ML IV SOLN: 4.0000 mg | Freq: Once | INTRAVENOUS | Status: DC

## 2019-10-21 MED ORDER — MAGNESIUM CITRATE PO SOLN
1.0000 | Freq: Once | ORAL | Status: AC
  Administered 2019-10-21: 18:00:00 1 via ORAL
  Filled 2019-10-21: qty 296

## 2019-10-21 NOTE — ED Notes (Signed)
Pt taken to CT at this time.

## 2019-10-21 NOTE — ED Triage Notes (Signed)
Patient arrived via EMS from [facility], patient has been vomitting for 2x days, patient was given phenregan by facility, and zofran given by EMS. Patient stated he had hx of Gastroparesis.  COVID + Foley Left BKA  BP 163/78 HR 78 RR 18 ET CO2 28 98.6 Temp 238 CBG

## 2019-10-21 NOTE — ED Provider Notes (Signed)
[facility] Emergency Department Provider Note  ____________________________________________  Time seen: Approximately 3:28 PM  I have reviewed the triage vital signs and the nursing notes.   HISTORY  Chief Complaint Emesis    HPI Eugene [name] is a 59 y.o. male with a history of anxiety depression diabetes hyperlipidemia IBS, gastroparesis, neurogenic bladder, recently diagnosed with Covid, sent to the ED from [facility] due to vomiting for the past 2 days.  He has been given Phenergan and Zofran today by the facility and EMS, still reports generalized abdominal pain and nausea.  Denies chest pain or shortness of breath, no cough.      Past Medical History:  Diagnosis Date  . Anxiety   . Depression   . Diabetes mellitus without complication (HCC)   . Hepatitis C   . Hyperlipidemia   . IBS (irritable bowel syndrome)   . Osteomyelitis (HCC)   . Spinal stenosis      Patient Active Problem List   Diagnosis Date Noted  . Suprapubic catheter (HCC)   . Sepsis (HCC) 10/02/2019  . MRSA bacteremia 02/08/2019  . Osteomyelitis of thoracic spine (HCC) 02/08/2019  . Peripheral vascular disease (HCC) 02/08/2019  . Hypertension 02/08/2019  . Dyslipidemia 02/08/2019  . Chronic pain syndrome 02/08/2019  . Polysubstance abuse (HCC) 02/08/2019  . Chronic hepatitis C without hepatic coma (HCC) 02/08/2019  . GERD (gastroesophageal reflux disease) 02/08/2019  . Cigarette smoker 02/08/2019  . Normocytic anemia 02/08/2019  . UTI (urinary tract infection) 02/07/2019  . AKI (acute kidney injury) (HCC) 02/07/2019  . Hyperkalemia 02/07/2019  . Diabetes (HCC) 02/07/2019  . Pressure injury of skin 02/07/2019  . Hyperlipidemia 11/07/2018  . MRSA (methicillin resistant Staphylococcus aureus) septicemia (HCC) 08/17/2017  . Other acute osteomyelitis, left ankle and foot (HCC) 06/29/2017  . Diabetic ulcer of right foot associated with diabetes mellitus due to  underlying condition (HCC) 06/27/2017     Past Surgical History:  Procedure Laterality Date  . bilateral amputation Bilateral   . CENTRAL LINE INSERTION-TUNNELED N/A 02/02/2019   Procedure: CENTRAL LINE INSERTION-TUNNELED;  Surgeon: Annice Needy, MD;  Location: [facility] INVASIVE CV LAB;  Service: Cardiovascular;  Laterality: N/A;  . SPINAL FUSION    . THORACIC SPINE SURGERY  01/2019   extensive washout     Prior to Admission medications   Medication Sig Start Date End Date Taking? Authorizing Provider  acetaminophen (TYLENOL) 325 MG tablet Take 650 mg by mouth every 4 (four) hours as needed for mild pain or fever.    [provider]  amLODipine (NORVASC) 10 MG tablet Take 10 mg by mouth daily.    [provider]  bisacodyl (DULCOLAX) 5 MG EC tablet Take 10 mg by mouth daily.    [provider]  cefdinir (OMNICEF) 300 MG capsule Take 1 capsule (300 mg total) by mouth every 12 (twelve) hours. 10/07/19   Enedina Finner, MD  docusate sodium (COLACE) 100 MG capsule Take 100 mg by mouth 2 (two) times daily.    [provider]  famotidine (PEPCID) 20 MG tablet Take 20 mg by mouth daily.     [provider]  ferrous gluconate (FERGON) 324 MG tablet Take 324 mg by mouth 2 (two) times daily with a meal.     [provider]  furosemide (LASIX) 40 MG tablet Take 40 mg by mouth 2 (two) times daily.    [provider]  hydrALAZINE (APRESOLINE) 50 MG tablet Take 50 mg by mouth 3 (three)  times daily.    [provider]  insulin glargine (LANTUS) 100 UNIT/ML injection Inject 20 Units into the skin at bedtime.     [provider]  insulin lispro (HUMALOG) 100 UNIT/ML injection Inject 0-15 Units into the skin 4 (four) times daily -  before meals and at bedtime. <150= 0 units 151-200= 3 units 201-250= 6 units 251-300= 9 units 301-350= 12 units 351-400= 15 units >400 call MD    [provider]  loratadine (CLARITIN) 10 MG  tablet Take 10 mg by mouth daily.    [provider]  magnesium hydroxide (MILK OF MAGNESIA) 400 MG/5ML suspension Take 30 mLs by mouth daily as needed for mild constipation. 03/04/19   Katha Hamming, MD  Melatonin 5 MG TABS Take 5 mg by mouth at bedtime.    [provider]  methocarbamol (ROBAXIN) 500 MG tablet Take 1,000 mg by mouth 4 (four) times daily.     [provider]  metoCLOPramide (REGLAN) 10 MG tablet Take 1 tablet (10 mg total) by mouth every 8 (eight) hours as needed for nausea or vomiting. 10/12/19 10/11/20  Phineas Semen, MD  metoprolol succinate (TOPROL-XL) 25 MG 24 hr tablet Take 25 mg by mouth daily.    [provider]  ondansetron (ZOFRAN) 4 MG tablet Take 4 mg by mouth every 6 (six) hours as needed for nausea or vomiting.    [provider]  ondansetron (ZOFRAN) 4 MG tablet Take 1 tablet (4 mg total) by mouth every 8 (eight) hours as needed. 10/12/19   Phineas Semen, MD  oxyCODONE ER 9 MG C12A Take 9 mg by mouth 2 (two) times daily. 10/07/19   Enedina Finner, MD  Oxycodone HCl 10 MG TABS Take 1 tablet (10 mg total) by mouth every 4 (four) hours as needed. 10/07/19   Enedina Finner, MD  polyethylene glycol (MIRALAX / GLYCOLAX) 17 g packet Take 17 g by mouth daily.    [provider]  pregabalin (LYRICA) 75 MG capsule Take 75 mg by mouth 2 (two) times daily.    [provider]  sodium phosphate Pediatric (FLEET) 3.5-9.5 GM/59ML enema Place 1 enema rectally every other day as needed for severe constipation.    [provider]  traZODone (DESYREL) 50 MG tablet Take 50 mg by mouth at bedtime.     [provider]     Allergies Patient has no known allergies.   History reviewed. No pertinent family history.  Social History Social History   Tobacco Use  . Smoking status: Current Every Day Smoker    Packs/day: 0.25    Types: Cigarettes  . Smokeless tobacco: Never Used  Substance Use Topics  .  Alcohol use: Not Currently  . Drug use: Not Currently    Review of Systems  Constitutional:   No fever or chills.  ENT:   No sore throat. No rhinorrhea. Cardiovascular:   No chest pain or syncope. Respiratory:   No dyspnea or cough. Gastrointestinal:   Positive as above for abdominal pain and vomiting.  No constipation or diarrhea.  Musculoskeletal:   Negative for focal pain or swelling All other systems reviewed and are negative except as documented above in ROS and HPI.  ____________________________________________   PHYSICAL EXAM:  VITAL SIGNS: ED Triage Vitals  Enc Vitals Group     BP 10/21/19 1351 (!) 159/80     Pulse Rate 10/21/19 1351 92     Resp 10/21/19 1351 12     Temp 10/21/19  1351 98.6 F (37 C)     Temp Source 10/21/19 1351 Oral     SpO2 10/21/19 1351 99 %     Weight --      Height --      Head Circumference --      Peak Flow --      Pain Score 10/21/19 1406 10     Pain Loc --      Pain Edu? --      Excl. in GC? --     Vital signs reviewed, nursing assessments reviewed.   Constitutional:   Alert and oriented. Non-toxic appearance.  Morbidly obese Eyes:   Conjunctivae are normal. EOMI. PERRL. ENT      Head:   Normocephalic and atraumatic.      Nose:   Wearing a mask.      Mouth/Throat:   Wearing a mask.      Neck:   No meningismus. Full ROM. Hematological/Lymphatic/Immunilogical:   No cervical lymphadenopathy. Cardiovascular:   RRR. Symmetric bilateral radial and DP pulses.  No murmurs. Cap refill less than 2 seconds. Respiratory:   Normal respiratory effort without tachypnea/retractions. Breath sounds are clear and equal bilaterally. No wheezes/rales/rhonchi. Gastrointestinal:   Soft with generalized tenderness.  Mildly distended.  No rebound, rigidity, or guarding.  Musculoskeletal:   Normal range of motion in all extremities. No joint effusions.  No lower extremity tenderness.  No edema. Neurologic:   Normal speech and language.  Motor grossly  intact. No acute focal neurologic deficits are appreciated.  Skin:    Skin is warm, dry and intact. No rash noted.  No petechiae, purpura, or bullae.  ____________________________________________    LABS (pertinent positives/negatives) (all labs ordered are listed, but only abnormal results are displayed) Labs Reviewed  COMPREHENSIVE METABOLIC PANEL - Abnormal; Notable for the following components:      Result Value   Glucose, Bld 296 (*)    BUN 24 (*)    Creatinine, Ser 1.33 (*)    Total Protein 8.3 (*)    Total Bilirubin 1.3 (*)    GFR calc non Af Amer 59 (*)    All other components within normal limits  CBC WITH DIFFERENTIAL/PLATELET - Abnormal; Notable for the following components:   WBC 13.4 (*)    HCT 37.9 (*)    Neutro Abs 11.4 (*)    All other components within normal limits  LIPASE, BLOOD   ____________________________________________   EKG  Interpreted by me Sinus rhythm rate of 90, normal axis and intervals.  Normal QRS ST segments and T waves.  ____________________________________________    RADIOLOGY  CT ABDOMEN PELVIS W CONTRAST  Result Date: 10/21/2019 CLINICAL DATA:  Abdomen distension nausea and vomiting EXAM: CT ABDOMEN AND PELVIS WITH CONTRAST TECHNIQUE: Multidetector CT imaging of the abdomen and pelvis was performed using the standard protocol following bolus administration of intravenous contrast. CONTRAST:  100mL OMNIPAQUE IOHEXOL 300 MG/ML  SOLN COMPARISON:  None. FINDINGS: Lower chest: Lung bases demonstrate no acute consolidation or effusion. The heart size is within normal limits. Circumferential wall thickening of the distal esophagus and GE junction Hepatobiliary: No focal liver abnormality is seen. No gallstones, gallbladder wall thickening, or biliary dilatation. Pancreas: Unremarkable. No pancreatic ductal dilatation or surrounding inflammatory changes. Spleen: Normal in size without focal abnormality. Adrenals/Urinary Tract: Adrenal glands are  normal. Kidneys show no hydronephrosis. Suprapubic catheter within the left aspect of bladder. The bladder is decompressed. Stomach/Bowel: The stomach is nonenlarged. Air and fluid-filled loops of small bowel  without distention. Prominent appendix size measuring up to 8 mm but no inflammatory changes. Large volume of stool throughout the colon. Focal area of wall thickening at the distal sigmoid colon with narrowed appearance of the lumen, series 4, image number 59. Moderate formed feces at the rectum, associated with rectal wall thickening and edema within the perirectal and presacral fat. Vascular/Lymphatic: Moderate aortic atherosclerosis without aneurysm. Multiple subcentimeter retroperitoneal nodes. Reproductive: Negative for mass Other: No free air.  Fat containing umbilical hernia, small. Musculoskeletal: Partially visualized spinal rods and fixating screws in the lower thoracic spine cage device at T8-T9 with severe presumed chronic deformity at T8 and T9. Probable chronic superior endplate deformities at T12 and L1. IMPRESSION: 1. Scattered fluid and air-filled loops of small bowel without significant distension or obstructive change. Findings could represent a mild ileus. 2. Large volume of stool throughout the colon with impacted feces at the rectum. There is associated rectal wall thickening and perirectal and presacral edema, suspicious for stercoral colitis 3. Focal area of wall thickening and luminal narrowing at the distal sigmoid colon, this may be further evaluated with direct visualization/colonoscopy. 4. Circumferential wall thickening of the distal esophagus and GE junction, esophagitis versus reflux versus mass less likely. This may be correlated with direct visualization/endoscopy. 5. Incompletely visualized postsurgical changes of the thoracic spine. Deformities at T8 and T9 presumably related to history of discitis and osteomyelitis. Suspect chronic superior endplate deformities at T12 and  L1. Electronically Signed   By: Jasmine Pang M.D.   On: 10/21/2019 15:47   DG Chest Portable 1 View  Result Date: 10/21/2019 CLINICAL DATA:  COVID-19 positive.  Decreased oxygen saturation EXAM: PORTABLE CHEST 1 VIEW COMPARISON:  October 02, 2019 FINDINGS: Lungs are clear. Heart is upper normal in size with pulmonary vascularity normal. No adenopathy. There are postoperative changes in the thoracic spine. IMPRESSION: Lungs clear.  Stable cardiac silhouette. Electronically Signed   By: Bretta Bang III M.D.   On: 10/21/2019 15:55    ____________________________________________   PROCEDURES Procedures  ____________________________________________  DIFFERENTIAL DIAGNOSIS   COVID-19 gastritis, bowel obstruction, bowel perforation, gastroparesis, dehydration, electrolyte abnormality  CLINICAL IMPRESSION / ASSESSMENT AND PLAN / ED COURSE  Medications ordered in the ED: Medications  sorbitol, milk of mag, mineral oil, glycerin (SMOG) enema (has no administration in time range)  sodium chloride 0.9 % bolus 1,000 mL (1,000 mLs Intravenous New Bag/Given 10/21/19 1401)  metoCLOPramide (REGLAN) injection 10 mg (10 mg Intravenous Given 10/21/19 1404)  diphenhydrAMINE (BENADRYL) injection 50 mg (50 mg Intravenous Given 10/21/19 1403)  iohexol (OMNIPAQUE) 300 MG/ML solution 100 mL (100 mLs Intravenous Contrast Given 10/21/19 1518)  pantoprazole (PROTONIX) injection 40 mg (40 mg Intravenous Given 10/21/19 1657)  magnesium citrate solution 1 Bottle (1 Bottle Oral Given 10/21/19 1750)  bisacodyl (DULCOLAX) suppository 10 mg (10 mg Rectal Given 10/21/19 1750)  haloperidol lactate (HALDOL) injection 2.5 mg (2.5 mg Intravenous Given 10/21/19 1820)    Pertinent labs & imaging results that were available during my care of the patient were reviewed by me and considered in my medical decision making (see chart for details).  Eugene [name] was evaluated in Emergency Department on 10/21/2019 for the symptoms  described in the history of present illness. He was evaluated in the context of the global COVID-19 pandemic, which necessitated consideration that the patient might be at risk for infection with the SARS-CoV-2 virus that causes COVID-19. Institutional protocols and algorithms that pertain to the evaluation of patients at risk for COVID-19 are  in a state of rapid change based on information released by regulatory bodies including the CDC and federal and state organizations. These policies and algorithms were followed during the patient's care in the ED.   Patient with significant chronic comorbidities and recently diagnosed COVID-19 comes the ED complaining of generalized abdominal pain and vomiting.  Vital signs are reassuring, will check labs, CT abdomen pelvis, give IV fluids for hydration, Benadryl and Reglan for symptom relief. Clinical Course as of Oct 20 1917  Wed Oct 21, 2019  1754 Work-up reassuring.  CT scan shows constipation without severe findings.  Patient still dry heaving, not able to do magnesium citrate right now.  Dulcolax suppository given.  I will give droperidol for his nausea and reattempt oral intake.  If unable he may need hospitalization for intractable vomiting.   [PS]  1918 Patient still not able to tolerate oral intake.  Will give a smog enema, plan to hospitalize overnight until symptoms improve.   [PS]    Clinical Course User Index [PS] Carrie Mew, MD     ____________________________________________   FINAL CLINICAL IMPRESSION(S) / ED DIAGNOSES    Final diagnoses:  Generalized abdominal pain  Intractable vomiting with nausea  Drug-induced constipation     ED Discharge Orders    None      Portions of this note were generated with dragon dictation software. Dictation errors may occur despite best attempts at proofreading.   Carrie Mew, MD 10/21/19 Lurena Nida

## 2019-10-21 NOTE — Progress Notes (Signed)
Talked with Cherrie Speagle infection prevention regarding this patient, Covid history and questionable need for isolation. Cherrie relayed no need for isolation since at least 10 day past Covid illness, not immunocompromised (did share with her about DM), no fever or shortness of breath.

## 2019-10-21 NOTE — H&P (Addendum)
TRH H&P    Patient Demographics:    Eugene [name], is a 59 y.o. male  MRN: [id number]  DOB - 1961/09/01  Admit Date - 10/21/2019  Referring MD/NP/PA:  Sharman Cheek  Outpatient Primary MD for the patient is System, Pcp Not In  Patient coming from:  home  Chief complaint-  N/v     HPI:    Eugene [name]  is a 59 y.o. male,  w anxiety/ depression, hyperlipidemia, Dm2, s/p bilateral BKA, h/o spinal stenosis s/p spinal fusion, Hepatitis C, chronic urinary retention with indwelling suprapubic catheter, presents with c/o n/v x 2 days. Slight abdominal cramping due to n/v.  Pt denies fever, chills, cough, cp, palp, sob, diarrhea, brbpr.  + constipation.   Per patient he has had gastroparesis and reglan seems to work for his intractable n/v  In ED,  T 98.6, P 92, R 12, Bp 159/80 pox 99% on RA  CT abd/ pelvis IMPRESSION: 1. Scattered fluid and air-filled loops of small bowel without significant distension or obstructive change. Findings could represent a mild ileus. 2. Large volume of stool throughout the colon with impacted feces at the rectum. There is associated rectal wall thickening and perirectal and presacral edema, suspicious for stercoral colitis 3. Focal area of wall thickening and luminal narrowing at the distal sigmoid colon, this may be further evaluated with direct visualization/colonoscopy. 4. Circumferential wall thickening of the distal esophagus and GE junction, esophagitis versus reflux versus mass less likely. This may be correlated with direct visualization/endoscopy.  5. Incompletely visualized postsurgical changes of the thoracic spine. Deformities at T8 and T9 presumably related to history of discitis and osteomyelitis. Suspect chronic superior endplate deformities at T12 and L1.   Na 139 K 4.0 Bun 24, Creatinine 1.33 Ast 31, Alt 37 Wbc 13.4, Hgb 13.0, Plt 243 Lipase  13  Urinalysis pending  covid-19 positive on 10/07/19, 09/17/19  Per RN, infection prevention states does not need precautions  Pt will be admitted for intractable n/v      Review of systems:    In addition to the HPI above,  No Fever-chills, No Headache, No changes with Vision or hearing, No problems swallowing food or Liquids, No Chest pain, Cough or Shortness of Breath,  No Blood in stool or Urine, No dysuria, No new skin rashes or bruises, No new joints pains-aches,  No new weakness, tingling, numbness in any extremity, No recent weight gain or loss, No polyuria, polydypsia or polyphagia, No significant Mental Stressors.  All other systems reviewed and are negative.    Past History of the following :    Past Medical History:  Diagnosis Date  . Anxiety   . Depression   . Diabetes mellitus without complication (HCC)   . Hepatitis C   . Hyperlipidemia   . IBS (irritable bowel syndrome)   . Osteomyelitis (HCC)   . Spinal stenosis       Past Surgical History:  Procedure Laterality Date  . bilateral amputation Bilateral   . CENTRAL LINE INSERTION-TUNNELED N/A 02/02/2019   Procedure:  CENTRAL LINE INSERTION-TUNNELED;  Surgeon: Algernon Huxley, MD;  Location: [facility];  Service: Cardiovascular;  Laterality: N/A;  . SPINAL FUSION    . THORACIC SPINE SURGERY  01/2019   extensive washout      Social History:      Social History   Tobacco Use  . Smoking status: Current Every Day Smoker    Packs/day: 0.25    Types: Cigarettes  . Smokeless tobacco: Never Used  Substance Use Topics  . Alcohol use: Not Currently       Family History :    History reviewed. No pertinent family history. Patient can't recall   Home Medications:   Prior to Admission medications   Medication Sig Start Date End Date Taking? Authorizing Provider  acetaminophen (TYLENOL) 325 MG tablet Take 650 mg by mouth every 4 (four) hours as needed for mild pain or fever.   Yes  [provider]  amLODipine (NORVASC) 10 MG tablet Take 10 mg by mouth daily.   Yes [provider]  bisacodyl (DULCOLAX) 10 MG suppository Place 10 mg rectally daily as needed for moderate constipation.   Yes [provider]  bisacodyl (DULCOLAX) 5 MG EC tablet Take 10 mg by mouth daily.   Yes [provider]  docusate sodium (COLACE) 100 MG capsule Take 100 mg by mouth 2 (two) times daily.   Yes [provider]  famotidine (PEPCID) 20 MG tablet Take 20 mg by mouth daily.    Yes [provider]  ferrous gluconate (FERGON) 324 MG tablet Take 324 mg by mouth 2 (two) times daily with a meal.    Yes [provider]  furosemide (LASIX) 40 MG tablet Take 40 mg by mouth 2 (two) times daily.   Yes [provider]  hydrALAZINE (APRESOLINE) 50 MG tablet Take 50 mg by mouth 3 (three) times daily.   Yes [provider]  insulin glargine (LANTUS) 100 UNIT/ML injection Inject 20 Units into the skin at bedtime.    Yes [provider]  insulin lispro (HUMALOG) 100 UNIT/ML injection Inject 0-15 Units into the skin 4 (four) times daily -  before meals and at bedtime. <150= 0 units 151-200= 3 units 201-250= 6 units 251-300= 9 units 301-350= 12 units 351-400= 15 units >400 call MD   Yes [provider]  loratadine (CLARITIN) 10 MG tablet Take 10 mg by mouth daily.   Yes [provider]  magnesium hydroxide (MILK OF MAGNESIA) 400 MG/5ML suspension Take 30 mLs by mouth daily as needed for mild constipation. 03/04/19  Yes Epifanio Lesches, MD  Melatonin 5 MG TABS Take 5 mg by mouth at bedtime.   Yes [provider]  methocarbamol (ROBAXIN) 500 MG tablet Take 1,000 mg by mouth 4 (four) times daily.    Yes [provider]  metoCLOPramide (REGLAN) 10 MG tablet Take 1 tablet (10 mg total) by mouth every 8 (eight) hours as needed for nausea or vomiting. 10/12/19 10/11/20 Yes Nance Pear, MD    metoprolol succinate (TOPROL-XL) 25 MG 24 hr tablet Take 25 mg by mouth daily.   Yes [provider]  ondansetron (ZOFRAN) 4 MG tablet Take 4 mg by mouth every 6 (six) hours as needed for nausea or vomiting.   Yes [provider]  oxyCODONE ER 9 MG C12A Take 9 mg by mouth 2 (two) times daily. 10/07/19  Yes Fritzi Mandes, MD  Oxycodone HCl 10 MG TABS Take 1 tablet (10 mg total) by  mouth every 4 (four) hours as needed. 10/07/19  Yes Enedina Finner, MD  polyethylene glycol (MIRALAX / GLYCOLAX) 17 g packet Take 17 g by mouth daily.   Yes [provider]  pregabalin (LYRICA) 75 MG capsule Take 75 mg by mouth 2 (two) times daily.   Yes [provider]  sodium phosphate Pediatric (FLEET) 3.5-9.5 GM/59ML enema Place 1 enema rectally every other day as needed for severe constipation.   Yes [provider]  traZODone (DESYREL) 50 MG tablet Take 50 mg by mouth at bedtime.    Yes [provider]     Allergies:    No Known Allergies   Physical Exam:   Vitals  Blood pressure (!) 145/88, pulse 87, temperature 98.7 F (37.1 C), temperature source Oral, resp. rate 18, SpO2 97 %.  1.  General:  axoxo3  2. Psychiatric: euthymic  3. Neurologic: nonfocal  4. HEENMT:  Anicteric, pupils 1.54mm symmetric, direct, consensual, intact Neck: no jvd  5. Respiratory : CTAB  6. Cardiovascular : rrr s1, s2, no m/g/r  7. Gastrointestinal:  Abd: soft, nt, nd, +bs  8. Skin:  Ext: no c/c/e, no rash  9.Musculoskeletal:  Good ROM    Data Review:    CBC Recent Labs  Lab 10/21/19 1344  WBC 13.4*  HGB 13.0  HCT 37.9*  PLT 243  MCV 89.4  MCH 30.7  MCHC 34.3  RDW 13.0  LYMPHSABS 1.3  MONOABS 0.6  EOSABS 0.0  BASOSABS 0.1   ------------------------------------------------------------------------------------------------------------------  Results for orders placed or performed during the hospital encounter of 10/21/19 (from the past 48  hour(s))  Comprehensive metabolic panel     Status: Abnormal   Collection Time: 10/21/19  1:44 PM  Result Value Ref Range   Sodium 139 135 - 145 mmol/L   Potassium 4.0 3.5 - 5.1 mmol/L   Chloride 102 98 - 111 mmol/L   CO2 24 22 - 32 mmol/L   Glucose, Bld 296 (H) 70 - 99 mg/dL   BUN 24 (H) 6 - 20 mg/dL   Creatinine, Ser 1.44 (H) 0.61 - 1.24 mg/dL   Calcium 9.4 8.9 - 31.5 mg/dL   Total Protein 8.3 (H) 6.5 - 8.1 g/dL   Albumin 4.0 3.5 - 5.0 g/dL   AST 31 15 - 41 U/L   ALT 37 0 - 44 U/L   Alkaline Phosphatase 71 38 - 126 U/L   Total Bilirubin 1.3 (H) 0.3 - 1.2 mg/dL   GFR calc non Af Amer 59 (L) >60 mL/min   GFR calc Af Amer >60 >60 mL/min   Anion gap 13 5 - 15    Comment: Performed at [facility], [address] [place], [place] [address]  Lipase, blood     Status: None   Collection Time: 10/21/19  1:44 PM  Result Value Ref Range   Lipase 13 11 - 51 U/L    Comment: Performed at [facility], [address] [place], [place] [address]  CBC with Differential     Status: Abnormal   Collection Time: 10/21/19  1:44 PM  Result Value Ref Range   WBC 13.4 (H) 4.0 - 10.5 K/uL   RBC 4.24 4.22 - 5.81 MIL/uL   Hemoglobin 13.0 13.0 - 17.0 g/dL   HCT 09.3 (L) 26.7 - 12.4 %   MCV 89.4 80.0 - 100.0 fL   MCH 30.7 26.0 - 34.0 pg   MCHC 34.3 30.0 - 36.0 g/dL   RDW 58.0 99.8 -  15.5 %   Platelets 243 150 - 400 K/uL   nRBC 0.0 0.0 - 0.2 %   Neutrophils Relative % 85 %   Neutro Abs 11.4 (H) 1.7 - 7.7 K/uL   Lymphocytes Relative 10 %   Lymphs Abs 1.3 0.7 - 4.0 K/uL   Monocytes Relative 5 %   Monocytes Absolute 0.6 0.1 - 1.0 K/uL   Eosinophils Relative 0 %   Eosinophils Absolute 0.0 0.0 - 0.5 K/uL   Basophils Relative 0 %   Basophils Absolute 0.1 0.0 - 0.1 K/uL   Immature Granulocytes 0 %   Abs Immature Granulocytes 0.05 0.00 - 0.07 K/uL    Comment: Performed at [facility], [address] [place], [place] [address]  Glucose, capillary     Status: Abnormal    Collection Time: 10/21/19  9:05 PM  Result Value Ref Range   Glucose-Capillary 300 (H) 70 - 99 mg/dL   Comment 1 Document in Chart     Chemistries  Recent Labs  Lab 10/21/19 1344  NA 139  K 4.0  CL 102  CO2 24  GLUCOSE 296*  BUN 24*  CREATININE 1.33*  CALCIUM 9.4  AST 31  ALT 37  ALKPHOS 71  BILITOT 1.3*   ------------------------------------------------------------------------------------------------------------------  ------------------------------------------------------------------------------------------------------------------ GFR: Estimated Creatinine Clearance: 79 mL/min (A) (by C-G formula based on SCr of 1.33 mg/dL (H)). Liver Function Tests: Recent Labs  Lab 10/21/19 1344  AST 31  ALT 37  ALKPHOS 71  BILITOT 1.3*  PROT 8.3*  ALBUMIN 4.0   Recent Labs  Lab 10/21/19 1344  LIPASE 13   No results for input(s): AMMONIA in the last 168 hours. Coagulation Profile: No results for input(s): INR, PROTIME in the last 168 hours. Cardiac Enzymes: No results for input(s): CKTOTAL, CKMB, CKMBINDEX, TROPONINI in the last 168 hours. BNP (last 3 results) No results for input(s): PROBNP in the last 8760 hours. HbA1C: No results for input(s): HGBA1C in the last 72 hours. CBG: Recent Labs  Lab 10/21/19 2105  GLUCAP 300*   Lipid Profile: No results for input(s): CHOL, HDL, LDLCALC, TRIG, CHOLHDL, LDLDIRECT in the last 72 hours. Thyroid Function Tests: No results for input(s): TSH, T4TOTAL, FREET4, T3FREE, THYROIDAB in the last 72 hours. Anemia Panel: No results for input(s): VITAMINB12, FOLATE, FERRITIN, TIBC, IRON, RETICCTPCT in the last 72 hours.  --------------------------------------------------------------------------------------------------------------- Urine analysis:    Component Value Date/Time   COLORURINE YELLOW (A) 10/02/2019 0649   APPEARANCEUR CLOUDY (A) 10/02/2019 0649   LABSPEC 1.013 10/02/2019 0649   PHURINE 5.0 10/02/2019 0649    GLUCOSEU 50 (A) 10/02/2019 0649   HGBUR SMALL (A) 10/02/2019 0649   BILIRUBINUR NEGATIVE 10/02/2019 0649   KETONESUR NEGATIVE 10/02/2019 0649   PROTEINUR 30 (A) 10/02/2019 0649   NITRITE POSITIVE (A) 10/02/2019 0649   LEUKOCYTESUR MODERATE (A) 10/02/2019 0649      Imaging Results:    CT ABDOMEN PELVIS W CONTRAST  Result Date: 10/21/2019 CLINICAL DATA:  Abdomen distension nausea and vomiting EXAM: CT ABDOMEN AND PELVIS WITH CONTRAST TECHNIQUE: Multidetector CT imaging of the abdomen and pelvis was performed using the standard protocol following bolus administration of intravenous contrast. CONTRAST:  OMNIPAQUE IOHEXOL 300 MG/ML  SOLN COMPARISON:  None. FINDINGS: Lower chest: Lung bases demonstrate no acute consolidation or effusion. The heart size is within normal limits. Circumferential wall thickening of the distal esophagus and GE junction Hepatobiliary: No focal liver abnormality is seen. No gallstones, gallbladder wall thickening, or biliary dilatation. Pancreas: Unremarkable. No pancreatic  ductal dilatation or surrounding inflammatory changes. Spleen: Normal in size without focal abnormality. Adrenals/Urinary Tract: Adrenal glands are normal. Kidneys show no hydronephrosis. Suprapubic catheter within the left aspect of bladder. The bladder is decompressed. Stomach/Bowel: The stomach is nonenlarged. Air and fluid-filled loops of small bowel without distention. Prominent appendix size measuring up to 8 mm but no inflammatory changes. Large volume of stool throughout the colon. Focal area of wall thickening at the distal sigmoid colon with narrowed appearance of the lumen, series 4, image number 59. Moderate formed feces at the rectum, associated with rectal wall thickening and edema within the perirectal and presacral fat. Vascular/Lymphatic: Moderate aortic atherosclerosis without aneurysm. Multiple subcentimeter retroperitoneal nodes. Reproductive: Negative for mass Other: No free air.  Fat  containing umbilical hernia, small. Musculoskeletal: Partially visualized spinal rods and fixating screws in the lower thoracic spine cage device at T8-T9 with severe presumed chronic deformity at T8 and T9. Probable chronic superior endplate deformities at T12 and L1. IMPRESSION: 1. Scattered fluid and air-filled loops of small bowel without significant distension or obstructive change. Findings could represent a mild ileus. 2. Large volume of stool throughout the colon with impacted feces at the rectum. There is associated rectal wall thickening and perirectal and presacral edema, suspicious for stercoral colitis 3. Focal area of wall thickening and luminal narrowing at the distal sigmoid colon, this may be further evaluated with direct visualization/colonoscopy. 4. Circumferential wall thickening of the distal esophagus and GE junction, esophagitis versus reflux versus mass less likely. This may be correlated with direct visualization/endoscopy. 5. Incompletely visualized postsurgical changes of the thoracic spine. Deformities at T8 and T9 presumably related to history of discitis and osteomyelitis. Suspect chronic superior endplate deformities at T12 and L1. Electronically Signed   By: Jasmine PangKim  Fujinaga M.D.   On: 10/21/2019 15:47   DG Chest Portable 1 View  Result Date: 10/21/2019 CLINICAL DATA:  COVID-19 positive.  Decreased oxygen saturation EXAM: PORTABLE CHEST 1 VIEW COMPARISON:  October 02, 2019 FINDINGS: Lungs are clear. Heart is upper normal in size with pulmonary vascularity normal. No adenopathy. There are postoperative changes in the thoracic spine. IMPRESSION: Lungs clear.  Stable cardiac silhouette. Electronically Signed   By: Bretta BangWilliam  Woodruff III M.D.   On: 10/21/2019 15:55   ekg nsr at 90, nl axis, nl int, no st-t changes c/w ischemia    Assessment & Plan:    Principal Problem:   Nausea and vomiting Active Problems:   Diabetes (HCC)   Hypertension  Intractable nausea and  vomitting Check urinalysis Clear liquids Reglan 10mg  iv q6h x4 zofran 4mg  iv q6h prn  Check cmp in am  Fecal impaction Mineral oil enema pr x1 per ED miralax 17gm po qday Colace 100mg  po bid Dulcolax suppository daily prn  Dulcolax 10mg  po qday Mineral oil enema pr daily prn constipation if dulcolax not working [employer]   Dm2 fsbs q4h, ISS  Diabetic neuropathy Cont Lyrica   Gerd Cont Pepcid  Hypertension Cont Amlodipine 10mg  po qday Cont Hydralazine 50mg  po tid Cont Toprol XL 25mg  po qday Cont Lasix 40mg  po bid  H/o Anemia Cont Ferrous sulfate Check cbc in am  H/o BKA Cont current pain medications.    DVT Prophylaxis-   Lovenox - SCDs   AM Labs Ordered, also please review Full Orders  Family Communication: Admission, patients condition and plan of care including tests being ordered have been discussed with the patient  who indicate understanding and agree with the plan and Code Status.  Code Status:  FULL CODE per patient, left message for brother that patient [facility]  Admission status: Observation: Based on patients clinical presentation and evaluation of above clinical data, I have made determination that patient meets observation criteria at this time.   Time spent in minutes :  55 minutes   Pearson GrippeJames Tarsha Blando M.D on 10/21/2019 at 9:23 PM

## 2019-10-21 NOTE — ED Notes (Signed)
X-ray at bedside

## 2019-10-22 ENCOUNTER — Other Ambulatory Visit: Payer: Self-pay

## 2019-10-22 DIAGNOSIS — K5903 Drug induced constipation: Secondary | ICD-10-CM | POA: Diagnosis present

## 2019-10-22 DIAGNOSIS — Z981 Arthrodesis status: Secondary | ICD-10-CM | POA: Diagnosis not present

## 2019-10-22 DIAGNOSIS — R1084 Generalized abdominal pain: Secondary | ICD-10-CM | POA: Diagnosis present

## 2019-10-22 DIAGNOSIS — Z794 Long term (current) use of insulin: Secondary | ICD-10-CM | POA: Diagnosis not present

## 2019-10-22 DIAGNOSIS — E1151 Type 2 diabetes mellitus with diabetic peripheral angiopathy without gangrene: Secondary | ICD-10-CM | POA: Diagnosis present

## 2019-10-22 DIAGNOSIS — Z89512 Acquired absence of left leg below knee: Secondary | ICD-10-CM | POA: Diagnosis not present

## 2019-10-22 DIAGNOSIS — L89152 Pressure ulcer of sacral region, stage 2: Secondary | ICD-10-CM | POA: Diagnosis present

## 2019-10-22 DIAGNOSIS — D649 Anemia, unspecified: Secondary | ICD-10-CM | POA: Diagnosis present

## 2019-10-22 DIAGNOSIS — Z79891 Long term (current) use of opiate analgesic: Secondary | ICD-10-CM | POA: Diagnosis not present

## 2019-10-22 DIAGNOSIS — R339 Retention of urine, unspecified: Secondary | ICD-10-CM | POA: Diagnosis present

## 2019-10-22 DIAGNOSIS — Z8616 Personal history of COVID-19: Secondary | ICD-10-CM | POA: Diagnosis not present

## 2019-10-22 DIAGNOSIS — R112 Nausea with vomiting, unspecified: Secondary | ICD-10-CM | POA: Diagnosis not present

## 2019-10-22 DIAGNOSIS — F17210 Nicotine dependence, cigarettes, uncomplicated: Secondary | ICD-10-CM | POA: Diagnosis present

## 2019-10-22 DIAGNOSIS — B182 Chronic viral hepatitis C: Secondary | ICD-10-CM | POA: Diagnosis present

## 2019-10-22 DIAGNOSIS — R933 Abnormal findings on diagnostic imaging of other parts of digestive tract: Secondary | ICD-10-CM | POA: Diagnosis present

## 2019-10-22 DIAGNOSIS — Z89511 Acquired absence of right leg below knee: Secondary | ICD-10-CM | POA: Diagnosis not present

## 2019-10-22 DIAGNOSIS — K3184 Gastroparesis: Secondary | ICD-10-CM | POA: Diagnosis present

## 2019-10-22 DIAGNOSIS — E1143 Type 2 diabetes mellitus with diabetic autonomic (poly)neuropathy: Secondary | ICD-10-CM | POA: Diagnosis present

## 2019-10-22 DIAGNOSIS — K5641 Fecal impaction: Secondary | ICD-10-CM | POA: Diagnosis present

## 2019-10-22 DIAGNOSIS — K219 Gastro-esophageal reflux disease without esophagitis: Secondary | ICD-10-CM | POA: Diagnosis present

## 2019-10-22 DIAGNOSIS — E1142 Type 2 diabetes mellitus with diabetic polyneuropathy: Secondary | ICD-10-CM | POA: Diagnosis present

## 2019-10-22 DIAGNOSIS — G8929 Other chronic pain: Secondary | ICD-10-CM | POA: Diagnosis present

## 2019-10-22 DIAGNOSIS — I10 Essential (primary) hypertension: Secondary | ICD-10-CM | POA: Diagnosis present

## 2019-10-22 DIAGNOSIS — K589 Irritable bowel syndrome without diarrhea: Secondary | ICD-10-CM | POA: Diagnosis present

## 2019-10-22 DIAGNOSIS — Z79899 Other long term (current) drug therapy: Secondary | ICD-10-CM | POA: Diagnosis not present

## 2019-10-22 DIAGNOSIS — E785 Hyperlipidemia, unspecified: Secondary | ICD-10-CM | POA: Diagnosis present

## 2019-10-22 LAB — URINALYSIS, ROUTINE W REFLEX MICROSCOPIC
Bilirubin Urine: NEGATIVE
Glucose, UA: 50 mg/dL — AB
Hgb urine dipstick: NEGATIVE
Ketones, ur: NEGATIVE mg/dL
Leukocytes,Ua: NEGATIVE
Nitrite: NEGATIVE
Protein, ur: 100 mg/dL — AB
Specific Gravity, Urine: >1.046 — ABNORMAL HIGH (ref 1.005–1.030)
pH: 5 (ref 5.0–8.0)

## 2019-10-22 LAB — COMPREHENSIVE METABOLIC PANEL
ALT: 29 U/L (ref 0–44)
AST: 22 U/L (ref 15–41)
Albumin: 3.4 g/dL — ABNORMAL LOW (ref 3.5–5.0)
Alkaline Phosphatase: 66 U/L (ref 38–126)
Anion gap: 9 (ref 5–15)
BUN: 25 mg/dL — ABNORMAL HIGH (ref 6–20)
CO2: 26 mmol/L (ref 22–32)
Calcium: 8.7 mg/dL — ABNORMAL LOW (ref 8.9–10.3)
Chloride: 105 mmol/L (ref 98–111)
Creatinine, Ser: 1.23 mg/dL (ref 0.61–1.24)
GFR calc Af Amer: >60 mL/min (ref >60)
GFR calc non Af Amer: >60 mL/min (ref >60)
Glucose, Bld: 208 mg/dL — ABNORMAL HIGH (ref 70–99)
Potassium: 3.6 mmol/L (ref 3.5–5.1)
Sodium: 140 mmol/L (ref 135–145)
Total Bilirubin: 0.9 mg/dL (ref 0.3–1.2)
Total Protein: 6.9 g/dL (ref 6.5–8.1)

## 2019-10-22 LAB — GLUCOSE, CAPILLARY
Glucose-Capillary: 164 mg/dL — ABNORMAL HIGH (ref 70–99)
Glucose-Capillary: 175 mg/dL — ABNORMAL HIGH (ref 70–99)
Glucose-Capillary: 214 mg/dL — ABNORMAL HIGH (ref 70–99)
Glucose-Capillary: 230 mg/dL — ABNORMAL HIGH (ref 70–99)
Glucose-Capillary: 260 mg/dL — ABNORMAL HIGH (ref 70–99)
Glucose-Capillary: 266 mg/dL — ABNORMAL HIGH (ref 70–99)
Glucose-Capillary: 294 mg/dL — ABNORMAL HIGH (ref 70–99)

## 2019-10-22 LAB — HEMOGLOBIN A1C
Hgb A1c MFr Bld: 8.3 % — ABNORMAL HIGH (ref 4.8–5.6)
Mean Plasma Glucose: 191.51 mg/dL

## 2019-10-22 LAB — CBC
HCT: 33 % — ABNORMAL LOW (ref 39.0–52.0)
Hemoglobin: 11.2 g/dL — ABNORMAL LOW (ref 13.0–17.0)
MCH: 30.8 pg (ref 26.0–34.0)
MCHC: 33.9 g/dL (ref 30.0–36.0)
MCV: 90.7 fL (ref 80.0–100.0)
Platelets: 196 10*3/uL (ref 150–400)
RBC: 3.64 MIL/uL — ABNORMAL LOW (ref 4.22–5.81)
RDW: 13.1 % (ref 11.5–15.5)
WBC: 8.5 10*3/uL (ref 4.0–10.5)
nRBC: 0 % (ref 0.0–0.2)

## 2019-10-22 LAB — MRSA PCR SCREENING: MRSA by PCR: NEGATIVE

## 2019-10-22 MED ORDER — METOCLOPRAMIDE HCL 5 MG/ML IJ SOLN
10.0000 mg | Freq: Three times a day (TID) | INTRAMUSCULAR | Status: DC
  Administered 2019-10-22 – 2019-10-23 (×3): 10 mg via INTRAVENOUS
  Filled 2019-10-22 (×3): qty 2

## 2019-10-22 MED ORDER — POLYETHYLENE GLYCOL 3350 17 G PO PACK
17.0000 g | PACK | Freq: Two times a day (BID) | ORAL | Status: DC
  Administered 2019-10-22 – 2019-10-24 (×4): 17 g via ORAL
  Filled 2019-10-22 (×4): qty 1

## 2019-10-22 MED ORDER — INSULIN ASPART 100 UNIT/ML ~~LOC~~ SOLN
0.0000 [IU] | SUBCUTANEOUS | Status: DC
  Administered 2019-10-22: 21:00:00 3 [IU] via SUBCUTANEOUS
  Administered 2019-10-22: 8 [IU] via SUBCUTANEOUS
  Administered 2019-10-23 (×2): 5 [IU] via SUBCUTANEOUS
  Administered 2019-10-23: 8 [IU] via SUBCUTANEOUS
  Administered 2019-10-23: 2 [IU] via SUBCUTANEOUS
  Administered 2019-10-23: 5 [IU] via SUBCUTANEOUS
  Administered 2019-10-24: 3 [IU] via SUBCUTANEOUS
  Administered 2019-10-24: 8 [IU] via SUBCUTANEOUS
  Administered 2019-10-24: 3 [IU] via SUBCUTANEOUS
  Administered 2019-10-24: 8 [IU] via SUBCUTANEOUS
  Administered 2019-10-24: 5 [IU] via SUBCUTANEOUS
  Filled 2019-10-22 (×12): qty 1

## 2019-10-22 MED ORDER — CHLORHEXIDINE GLUCONATE CLOTH 2 % EX PADS
6.0000 | MEDICATED_PAD | Freq: Every day | CUTANEOUS | Status: DC
  Administered 2019-10-22 – 2019-10-24 (×3): 6 via TOPICAL

## 2019-10-22 NOTE — Plan of Care (Signed)

## 2019-10-22 NOTE — Progress Notes (Signed)
Inpatient Diabetes Program Recommendations  AACE/ADA: New Consensus Statement on Inpatient Glycemic Control (2015)  Target Ranges:  Prepandial:   less than 140 mg/dL      Peak postprandial:   less than 180 mg/dL (1-2 hours)      Critically ill patients:  140 - 180 mg/dL   Lab Results  Component Value Date   GLUCAP 260 (H) 10/22/2019   HGBA1C 6.3 (H) 02/08/2019    Review of Glycemic Control  Diabetes history: DM2 Outpatient Diabetes medications: Lantus 20 units QHS, Humalog 0-15 units tidwc and hs Current orders for Inpatient glycemic control: Novolog 0-15 units Q4H  Inpatient Diabetes Program Recommendations:     Add Lantus 15 units QHS  HgbA1C pending.  Continue to follow.  Thank you. Ailene Ards, RD, LDN, CDE Inpatient Diabetes Coordinator [phone]

## 2019-10-22 NOTE — Progress Notes (Signed)
PROGRESS NOTE    Eugene [name]  IHK:[id number] DOB: 12-Nov-1960 DOA: 10/21/2019 PCP: System, Pcp Not In   Brief Narrative:  Eugene [name]  is a 59 y.o. male,  w anxiety/ depression, hyperlipidemia, Dm2, s/p bilateral BKA, h/o spinal stenosis s/p spinal fusion, Hepatitis C, chronic urinary retention with indwelling suprapubic catheter, recent COVID-19 infection, presents with c/o n/v x 2 days. Slight abdominal cramping due to n/v secondary to gastroparesis exacerbation.  CT abdomen with mild ileus and a lot of stool burden in colon. Had 2 bowel movements after getting enema last night.  Subjective: Continues to experience nausea with no vomiting today.  He does not want to eat.  He had some broth which resulted in worsening of his nausea.  He was also complaining of diffuse abdominal pain.  Had good to BM overnight after getting enema.  Assessment & Plan:   Principal Problem:   Nausea and vomiting Active Problems:   Diabetes (HCC)   Hypertension  Exacerbation of gastroparesis.  Patient continued to experience some nausea although no vomiting.  Per patient he normally responded well to Reglan. May be some element of gastroenteritis, as fever of 100 F was reported overnight.  Afebrile this morning. CT abdomen with heavy stool burden which can also contribute with the symptoms.  There was some changes with possible colitis, mild neutrophilic leukocytosis.  Recently received antibiotics for complicated UTI. -Continue Reglan every 8 hourly. -Clear liquid diet-we will advance as tolerated. -Monitor leukocytosis and fever curve. -Continue to monitor.  Fecal impaction.  Patient had 2 large BMs after getting enema. -Continue with good bowel regimen. -Continue to monitor.  Diabetes.  CBG elevated mostly in 200.  Prior A1c done in May 2020 was 6.3. -Needs a tighter control of blood sugar with gastroparesis. -Check A1c. -Put him on every 4 hourly moderate sliding scale. -We will restart  home dose of Lantus once able to tolerate more p.o. intake.  Hypertension.  Blood pressure within goal at this time. -Continue home dose of amlodipine, hydralazine, Toprol and Lasix.  Anemia.  Hemoglobin stable. -Continue home dose of iron supplement.  Diabetic neuropathy. -Continue home dose of Lyrica.  History of bilateral BKA. -Continue home pain medications.  Suprapubic catheter.  UA with few RBCs and bacteria.  Most likely colonization as recently being treated for UTI. -Continue with suprapubic catheter care.  Objective: Vitals:   10/21/19 2056 10/21/19 2131 10/22/19 0036 10/22/19 0825  BP: (!) 145/88 (!) 162/96 125/70 (!) 153/80  Pulse: 87 71 92 69  Resp: 18 18 16    Temp: 98.7 F (37.1 C) 100.1 F (37.8 C) 99.9 F (37.7 C) (!) 97.5 F (36.4 C)  TempSrc: Oral Oral Oral Oral  SpO2: 97% 100% 95% 98%  Weight:  103.2 kg    Height:  6' (1.829 m)      Intake/Output Summary (Last 24 hours) at 10/22/2019 1404 Last data filed at 10/22/2019 1328 Gross per 24 hour  Intake 1355.12 ml  Output --  Net 1355.12 ml   Filed Weights   10/21/19 2131  Weight: 103.2 kg    Examination:  General exam: Well-developed, obese gentleman with bilateral BKA, in no acute distress Respiratory system: Clear to auscultation. Respiratory effort normal. Cardiovascular system: S1 & S2 heard, RRR. No JVD, murmurs, rubs, gallops or clicks. Gastrointestinal system: Soft, mild diffuse tenderness, nondistended, bowel sounds positive. Central nervous system: Alert and oriented. No focal neurological deficits. Extremities: Bilateral BKA, no cyanosis, pulses intact and symmetrical. Skin: No rashes, lesions or  ulcers Psychiatry: Judgement and insight appear normal. Mood & affect appropriate.    DVT prophylaxis: Lovenox Code Status: Full Family Communication: No family at bedside  Disposition Plan: Pending improvement.  Most likely will go back home.  Consultants:   None  Procedures:   Antimicrobials:   Data Reviewed: I have personally reviewed following labs and imaging studies  CBC: Recent Labs  Lab 10/21/19 1344 10/22/19 0549  WBC 13.4* 8.5  NEUTROABS 11.4*  --   HGB 13.0 11.2*  HCT 37.9* 33.0*  MCV 89.4 90.7  PLT 243 196   Basic Metabolic Panel: Recent Labs  Lab 10/21/19 1344 10/22/19 0549  NA 139 140  K 4.0 3.6  CL 102 105  CO2 24 26  GLUCOSE 296* 208*  BUN 24* 25*  CREATININE 1.33* 1.23  CALCIUM 9.4 8.7*   GFR: Estimated Creatinine Clearance: 83.3 mL/min (by C-G formula based on SCr of 1.23 mg/dL). Liver Function Tests: Recent Labs  Lab 10/21/19 1344 10/22/19 0549  AST 31 22  ALT 37 29  ALKPHOS 71 66  BILITOT 1.3* 0.9  PROT 8.3* 6.9  ALBUMIN 4.0 3.4*   Recent Labs  Lab 10/21/19 1344  LIPASE 13   No results for input(s): AMMONIA in the last 168 hours. Coagulation Profile: No results for input(s): INR, PROTIME in the last 168 hours. Cardiac Enzymes: No results for input(s): CKTOTAL, CKMB, CKMBINDEX, TROPONINI in the last 168 hours. BNP (last 3 results) No results for input(s): PROBNP in the last 8760 hours. HbA1C: No results for input(s): HGBA1C in the last 72 hours. CBG: Recent Labs  Lab 10/21/19 2105 10/22/19 0037 10/22/19 0351 10/22/19 0806 10/22/19 1217  GLUCAP 300* 266* 230* 175* 294*   Lipid Profile: No results for input(s): CHOL, HDL, LDLCALC, TRIG, CHOLHDL, LDLDIRECT in the last 72 hours. Thyroid Function Tests: No results for input(s): TSH, T4TOTAL, FREET4, T3FREE, THYROIDAB in the last 72 hours. Anemia Panel: No results for input(s): VITAMINB12, FOLATE, FERRITIN, TIBC, IRON, RETICCTPCT in the last 72 hours. Sepsis Labs: No results for input(s): PROCALCITON, LATICACIDVEN in the last 168 hours.  Recent Results (from the past 240 hour(s))  MRSA PCR Screening     Status: None   Collection Time: 10/22/19  3:24 AM   Specimen: Urine, Suprapubic; Nasopharyngeal  Result Value Ref Range Status   MRSA by PCR  NEGATIVE NEGATIVE Final    Comment:        The GeneXpert MRSA Assay (FDA approved for NASAL specimens only), is one component of a comprehensive MRSA colonization surveillance program. It is not intended to diagnose MRSA infection nor to guide or monitor treatment for MRSA infections. Performed at [facility], [address] [place], [place] [address]      Radiology Studies: CT ABDOMEN PELVIS W CONTRAST  Result Date: 10/21/2019 CLINICAL DATA:  Abdomen distension nausea and vomiting EXAM: CT ABDOMEN AND PELVIS WITH CONTRAST TECHNIQUE: Multidetector CT imaging of the abdomen and pelvis was performed using the standard protocol following bolus administration of intravenous contrast. CONTRAST:  OMNIPAQUE IOHEXOL 300 MG/ML  SOLN COMPARISON:  None. FINDINGS: Lower chest: Lung bases demonstrate no acute consolidation or effusion. The heart size is within normal limits. Circumferential wall thickening of the distal esophagus and GE junction Hepatobiliary: No focal liver abnormality is seen. No gallstones, gallbladder wall thickening, or biliary dilatation. Pancreas: Unremarkable. No pancreatic ductal dilatation or surrounding inflammatory changes. Spleen: Normal in size without focal abnormality. Adrenals/Urinary Tract: Adrenal glands are normal. Kidneys show no hydronephrosis. Suprapubic  catheter within the left aspect of bladder. The bladder is decompressed. Stomach/Bowel: The stomach is nonenlarged. Air and fluid-filled loops of small bowel without distention. Prominent appendix size measuring up to 8 mm but no inflammatory changes. Large volume of stool throughout the colon. Focal area of wall thickening at the distal sigmoid colon with narrowed appearance of the lumen, series 4, image number 59. Moderate formed feces at the rectum, associated with rectal wall thickening and edema within the perirectal and presacral fat. Vascular/Lymphatic: Moderate aortic atherosclerosis without  aneurysm. Multiple subcentimeter retroperitoneal nodes. Reproductive: Negative for mass Other: No free air.  Fat containing umbilical hernia, small. Musculoskeletal: Partially visualized spinal rods and fixating screws in the lower thoracic spine cage device at T8-T9 with severe presumed chronic deformity at T8 and T9. Probable chronic superior endplate deformities at T12 and L1. IMPRESSION: 1. Scattered fluid and air-filled loops of small bowel without significant distension or obstructive change. Findings could represent a mild ileus. 2. Large volume of stool throughout the colon with impacted feces at the rectum. There is associated rectal wall thickening and perirectal and presacral edema, suspicious for stercoral colitis 3. Focal area of wall thickening and luminal narrowing at the distal sigmoid colon, this may be further evaluated with direct visualization/colonoscopy. 4. Circumferential wall thickening of the distal esophagus and GE junction, esophagitis versus reflux versus mass less likely. This may be correlated with direct visualization/endoscopy. 5. Incompletely visualized postsurgical changes of the thoracic spine. Deformities at T8 and T9 presumably related to history of discitis and osteomyelitis. Suspect chronic superior endplate deformities at T12 and L1. Electronically Signed   By: Donavan Foil M.D.   On: 10/21/2019 15:47   DG Chest Portable 1 View  Result Date: 10/21/2019 CLINICAL DATA:  COVID-19 positive.  Decreased oxygen saturation EXAM: PORTABLE CHEST 1 VIEW COMPARISON:  October 02, 2019 FINDINGS: Lungs are clear. Heart is upper normal in size with pulmonary vascularity normal. No adenopathy. There are postoperative changes in the thoracic spine. IMPRESSION: Lungs clear.  Stable cardiac silhouette. Electronically Signed   By: Lowella Grip III M.D.   On: 10/21/2019 15:55    Scheduled Meds: . amLODipine  10 mg Oral Daily  . bisacodyl  10 mg Oral Daily  . Chlorhexidine Gluconate  Cloth  6 each Topical Daily  . docusate sodium  100 mg Oral BID  . enoxaparin (LOVENOX) injection  40 mg Subcutaneous Q24H  . famotidine  20 mg Oral Daily  . ferrous gluconate  324 mg Oral BID WC  . furosemide  40 mg Oral BID  . hydrALAZINE  50 mg Oral TID  . insulin aspart  0-9 Units Subcutaneous Q4H  . linaclotide  145 mcg Oral QAC breakfast  . loratadine  10 mg Oral Daily  . Melatonin  5 mg Oral QHS  . methocarbamol  1,000 mg Oral QID  . metoCLOPramide (REGLAN) injection  10 mg Intravenous Q8H  . metoprolol succinate  25 mg Oral Daily  . oxyCODONE  10 mg Oral BID  . polyethylene glycol  17 g Oral BID  . pregabalin  75 mg Oral BID  . traZODone  50 mg Oral QHS   Continuous Infusions:   LOS: 0 days   Time spent: 40 minutes  Lorella Nimrod, MD [facility] Pager [phone]  If 7PM-7AM, please contact night-coverage [email or web address] Password [facility] 10/22/2019, 2:04 PM   This record has been created using Dragon voice recognition software. Errors have been sought and corrected,but may not always be located. Such  creation errors do not reflect on the standard of care.

## 2019-10-23 ENCOUNTER — Ambulatory Visit: Payer: Medicaid Other

## 2019-10-23 ENCOUNTER — Encounter: Payer: Self-pay | Admitting: Urology

## 2019-10-23 DIAGNOSIS — K3184 Gastroparesis: Secondary | ICD-10-CM

## 2019-10-23 DIAGNOSIS — E1143 Type 2 diabetes mellitus with diabetic autonomic (poly)neuropathy: Principal | ICD-10-CM

## 2019-10-23 LAB — CBC WITH DIFFERENTIAL/PLATELET
Abs Immature Granulocytes: 0.02 10*3/uL (ref 0.00–0.07)
Basophils Absolute: 0.1 10*3/uL (ref 0.0–0.1)
Basophils Relative: 1 %
Eosinophils Absolute: 0.1 10*3/uL (ref 0.0–0.5)
Eosinophils Relative: 2 %
HCT: 31.8 % — ABNORMAL LOW (ref 39.0–52.0)
Hemoglobin: 10.6 g/dL — ABNORMAL LOW (ref 13.0–17.0)
Immature Granulocytes: 0 %
Lymphocytes Relative: 48 %
Lymphs Abs: 2.8 10*3/uL (ref 0.7–4.0)
MCH: 30.7 pg (ref 26.0–34.0)
MCHC: 33.3 g/dL (ref 30.0–36.0)
MCV: 92.2 fL (ref 80.0–100.0)
Monocytes Absolute: 0.6 10*3/uL (ref 0.1–1.0)
Monocytes Relative: 10 %
Neutro Abs: 2.3 10*3/uL (ref 1.7–7.7)
Neutrophils Relative %: 39 %
Platelets: 174 10*3/uL (ref 150–400)
RBC: 3.45 MIL/uL — ABNORMAL LOW (ref 4.22–5.81)
RDW: 13.2 % (ref 11.5–15.5)
WBC: 6 10*3/uL (ref 4.0–10.5)
nRBC: 0 % (ref 0.0–0.2)

## 2019-10-23 LAB — GLUCOSE, CAPILLARY
Glucose-Capillary: 95 mg/dL (ref 70–99)
Glucose-Capillary: 131 mg/dL — ABNORMAL HIGH (ref 70–99)
Glucose-Capillary: 212 mg/dL — ABNORMAL HIGH (ref 70–99)
Glucose-Capillary: 227 mg/dL — ABNORMAL HIGH (ref 70–99)
Glucose-Capillary: 258 mg/dL — ABNORMAL HIGH (ref 70–99)

## 2019-10-23 LAB — COMPREHENSIVE METABOLIC PANEL
ALT: 59 U/L — ABNORMAL HIGH (ref 0–44)
AST: 48 U/L — ABNORMAL HIGH (ref 15–41)
Albumin: 3.2 g/dL — ABNORMAL LOW (ref 3.5–5.0)
Alkaline Phosphatase: 57 U/L (ref 38–126)
Anion gap: 10 (ref 5–15)
BUN: 23 mg/dL — ABNORMAL HIGH (ref 6–20)
CO2: 25 mmol/L (ref 22–32)
Calcium: 8.5 mg/dL — ABNORMAL LOW (ref 8.9–10.3)
Chloride: 104 mmol/L (ref 98–111)
Creatinine, Ser: 1.24 mg/dL (ref 0.61–1.24)
GFR calc Af Amer: >60 mL/min (ref >60)
GFR calc non Af Amer: >60 mL/min (ref >60)
Glucose, Bld: 102 mg/dL — ABNORMAL HIGH (ref 70–99)
Potassium: 3.8 mmol/L (ref 3.5–5.1)
Sodium: 139 mmol/L (ref 135–145)
Total Bilirubin: 1 mg/dL (ref 0.3–1.2)
Total Protein: 6.2 g/dL — ABNORMAL LOW (ref 6.5–8.1)

## 2019-10-23 MED ORDER — FUROSEMIDE 40 MG PO TABS
40.0000 mg | ORAL_TABLET | Freq: Two times a day (BID) | ORAL | Status: DC
  Administered 2019-10-24 (×2): 40 mg via ORAL
  Filled 2019-10-23 (×2): qty 1

## 2019-10-23 MED ORDER — METOCLOPRAMIDE HCL 10 MG PO TABS
10.0000 mg | ORAL_TABLET | Freq: Three times a day (TID) | ORAL | Status: DC
  Administered 2019-10-23 – 2019-10-24 (×5): 10 mg via ORAL
  Filled 2019-10-23 (×5): qty 1

## 2019-10-23 MED ORDER — SODIUM CHLORIDE 0.9 % IV SOLN: INTRAVENOUS | Status: DC

## 2019-10-23 NOTE — TOC Initial Note (Addendum)
Transition of Care [facility]) - Initial/Assessment Note    Patient Details  Name: Eugene [name] MRN: [id number] Date of Birth: July 14, 1961  [facility] CM/SW Contact:    Elease Hashimoto, LCSW Phone Number: 10/23/2019, 10:43 AM  Clinical Narrative:  Pt resident from [facility]. He wishes to return there once medically stable. Made aware MD feels he will be ready tomorrow. Will complete new FL2 and MD aware needs new COVID test. Pt feels this is not needed due to has had several recently  After every hospitalization. Have placed a call to kelly-admissions from [facility] regarding this. Will ask Weekend coverage to follow up with DC tomorrow and call [name] when pt ready to transfer back to facility.   10:49 am [name] reports no COVID test needed, have let MD and RN know and also pt.    Expected Discharge Plan: Skilled Nursing Facility Barriers to Discharge: No Barriers Identified   Patient Goals and CMS Choice Patient states their goals for this hospitalization and ongoing recovery are:: Return back to facility      Expected Discharge Plan and Services Expected Discharge Plan: [place] In-house Referral: Clinical Social Work   Post Acute Care Choice: [place] Living arrangements for the past 2 months: [employer]                                      Prior Living Arrangements/Services Living arrangements for the past 2 months: [place]                     Activities of Daily Living Home Assistive Devices/Equipment: CBG Meter, Wheelchair ADL Screening (condition at time of admission) Patient's cognitive ability adequate to safely complete daily activities?: Yes Is the patient deaf or have difficulty hearing?: No Does the patient have difficulty seeing, even when wearing glasses/contacts?: Yes Does the patient have difficulty concentrating, remembering, or making decisions?:  No Patient able to express need for assistance with ADLs?: Yes Does the patient have difficulty dressing or bathing?: Yes Independently performs ADLs?: Yes (appropriate for developmental age) Does the patient have difficulty walking or climbing stairs?: Yes Weakness of Legs: None(BL BKA) Weakness of Arms/Hands: None  Permission Sought/Granted      Share Information with NAME: kelly  Permission granted to share info w AGENCY: [place] [facility]  Permission granted to share info w Relationship: Brother     Emotional Assessment Appearance:: Appears stated age Attitude/Demeanor/Rapport: Engaged Affect (typically observed): Calm Orientation: : Oriented to Self, Oriented to Place, Oriented to  Time, Oriented to Situation      Admission diagnosis:  Generalized abdominal pain [R10.84] Drug-induced constipation [K59.03] Nausea and vomiting [R11.2] Intractable vomiting with nausea [R11.2] Patient Active Problem List   Diagnosis Date Noted  . Nausea and vomiting 10/21/2019  . Suprapubic catheter ([place])   . Sepsis ([place]) 10/02/2019  . MRSA bacteremia 02/08/2019  . Osteomyelitis of thoracic spine ([place]) 02/08/2019  . Peripheral vascular disease ([place]) 02/08/2019  . Hypertension 02/08/2019  . Dyslipidemia 02/08/2019  . Chronic pain syndrome 02/08/2019  . Polysubstance abuse ([place]) 02/08/2019  . Chronic hepatitis C without hepatic coma ([place]) 02/08/2019  . GERD (gastroesophageal reflux disease) 02/08/2019  . Cigarette smoker 02/08/2019  . Normocytic anemia 02/08/2019  . UTI (urinary tract infection) 02/07/2019  . AKI (acute kidney injury) ([place]) 02/07/2019  . Hyperkalemia 02/07/2019  . Diabetes (  HCC) 02/07/2019  . Pressure injury of skin 02/07/2019  . Hyperlipidemia 11/07/2018  . MRSA (methicillin resistant Staphylococcus aureus) septicemia (HCC) 08/17/2017  . Other acute osteomyelitis, left ankle and foot (HCC) 06/29/2017  . Diabetic ulcer of right foot associated with diabetes  mellitus due to underlying condition (HCC) 06/27/2017   PCP:  System, Pcp Not In Pharmacy:  No Pharmacies Listed    Social Determinants of Health (SDOH) Interventions    Readmission Risk Interventions Readmission Risk Prevention Plan 10/05/2019 03/04/2019  Transportation Screening Complete Complete  Medication Review [name]) Complete Complete  PCP or Specialist appointment within 3-5 days of discharge Complete -  HRI or Home Care Consult Complete -  SW Recovery Care/Counseling Consult Complete -  Palliative Care Screening Not Applicable Not Applicable  Skilled Nursing Facility Complete Complete

## 2019-10-23 NOTE — Progress Notes (Signed)
PROGRESS NOTE    Eugene [name]  NOB:[id number] DOB: Jan 16, 1961 DOA: 10/21/2019 PCP: System, Pcp Not In      Brief Narrative:  Mr. Mullendore is a 59 y.o. M with DM, HTN, hep C, bilateral BKA, CUR with indwelling SP cath, and MRSA bacteremia with vertebral osteomyelitis in 2020 who presented with N/V for 2 days similar to previous gastroparesis flares.  In the ER, afebrile, tachycardic, otherwise hemodynamically stable, but unable to tolerate PO. CT showed severe constipation with stercoral colitis.       Assessment & Plan:  Possible gastroparesis Symptoms starting to improve.  Able to keep down clears this morning, has not yet had solid food. -Change Reglan to PO -Discontinue IV fluids and trial solid food to ADAT   Constipation Got enema night of admission and 2 good BM after.   -Continue Miralax twice daily, bisacodyl, docusate, and linaclotide  Hepatitis C  Indwelling foley due to chronic urinary retention  Diabetes with polyneuropathy and history bilateral BKA due to PVD Glucoses trending down while NPO -Continue SS correction insulin  Hypertension BP controlled -Continue amlodipine, furosemide, hydralazine, metoprolol  Anemia Hgb 10, trending down but likely phlebotomy, dilution.  No clinical bleeding noted. -Continue iron  Stage II pressure injury, coccyx, POA -Consult WOC  Other medications -Continue Pepcid  Chronic pain -Continue Lyrica, methocarbamol, oxycodone, Trazodone  IBS -Continue linaclotide   Abnormal imaging Focal area of wall thickening and luminal narrowing at the distal sigmoid colon, this may be further evaluated with direct visualization/colonoscopy. 4. Circumferential wall thickening of the distal esophagus and GE junction, esophagitis versus reflux versus mass less likely. This may be correlated with direct visualization/endoscopy. -Outpatient GI follow up        Disposition: The patient was admitted with gastroparesis and  intractable vomiting. His symptoms have improved slowly over the last 24 hours, he is able to tolerate clears this morning without vomiting.   I will discharge when he is able to tolerate solid food with only oral Reglan.  If he is able to tolerate solid food this evening, and his symptoms are controlled with oral Reglan overnight, home in the morning         MDM: The below labs and imaging reports were reviewed and summarized above.  Medication management as above.       DVT prophylaxis: Lovenox Code Status: Full code Family Communication:     Consultants:   None  Procedures:   1/27 CT abdomen pelvis -- colon and esophagus thickening, severe constipation with stercoral colitis, no SBO  Antimicrobials:   None   Culture data:   None           Subjective: Patient's reflux is resolved with Reglan.  He has had no vomiting this morning, but still feels somewhat nauseated.  No fever, confusion, dysuria, flank pain.  No melena or hematochezia.  Objective: Vitals:   10/23/19 0037 10/23/19 0414 10/23/19 0824 10/23/19 1025  BP: 106/61  112/64 121/72  Pulse: (!) 52  (!) 55 63  Resp: 19  20   Temp: 97.8 F (36.6 C)  99 F (37.2 C)   TempSrc:   Oral   SpO2: 93%  96% 96%  Weight:  103.2 kg    Height:        Intake/Output Summary (Last 24 hours) at 10/23/2019 1438 Last data filed at 10/23/2019 1329 Gross per 24 hour  Intake --  Output 650 ml  Net -650 ml   Filed Weights   10/21/19 2131  10/23/19 0414  Weight: 103.2 kg 103.2 kg    Examination: General appearance: Obese adult male, alert and in no acute distress.  Lying in bed HEENT: Anicteric, conjunctiva pink, lids and lashes normal. No nasal deformity, discharge, epistaxis.  Lips dry, dentition in poor repair, oropharynx tacky dry, no oral lesions, hearing normal.   Skin: Warm and dry.  No jaundice.  No suspicious rashes or lesions. Cardiac: RRR, nl S1-S2, no murmurs appreciated.  Capillary refill is  brisk.  JVP not visible.  No LE edema.  Radial pulses 2+ and symmetric. Respiratory: Normal respiratory rate and rhythm.  CTAB without rales or wheezes. Abdomen: Abdomen soft.  No TTP or guarding. No ascites, distension, hepatosplenomegaly.   MSK: No deformities or effusions.  Bilateral BKA Neuro: Awake and alert.  EOMI, moves all extremities. Speech fluent.    Psych: Sensorium intact and responding to questions, attention normal. Affect normal.  Judgment and insight appear normal.    Data Reviewed: I have personally reviewed following labs and imaging studies:  CBC: Recent Labs  Lab 10/21/19 1344 10/22/19 0549 10/23/19 0437  WBC 13.4* 8.5 6.0  NEUTROABS 11.4*  --  2.3  HGB 13.0 11.2* 10.6*  HCT 37.9* 33.0* 31.8*  MCV 89.4 90.7 92.2  PLT 243 196 174   Basic Metabolic Panel: Recent Labs  Lab 10/21/19 1344 10/22/19 0549 10/23/19 0437  NA 139 140 139  K 4.0 3.6 3.8  CL 102 105 104  CO2 24 26 25   GLUCOSE 296* 208* 102*  BUN 24* 25* 23*  CREATININE 1.33* 1.23 1.24  CALCIUM 9.4 8.7* 8.5*   GFR: Estimated Creatinine Clearance: 82.6 mL/min (by C-G formula based on SCr of 1.24 mg/dL). Liver Function Tests: Recent Labs  Lab 10/21/19 1344 10/22/19 0549 10/23/19 0437  AST 31 22 48*  ALT 37 29 59*  ALKPHOS 71 66 57  BILITOT 1.3* 0.9 1.0  PROT 8.3* 6.9 6.2*  ALBUMIN 4.0 3.4* 3.2*   Recent Labs  Lab 10/21/19 1344  LIPASE 13   No results for input(s): AMMONIA in the last 168 hours. Coagulation Profile: No results for input(s): INR, PROTIME in the last 168 hours. Cardiac Enzymes: No results for input(s): CKTOTAL, CKMB, CKMBINDEX, TROPONINI in the last 168 hours. BNP (last 3 results) No results for input(s): PROBNP in the last 8760 hours. HbA1C: Recent Labs    10/22/19 0549  HGBA1C 8.3*   CBG: Recent Labs  Lab 10/22/19 2040 10/22/19 2338 10/23/19 0354 10/23/19 0827 10/23/19 1158  GLUCAP 164* 214* 95 131* 212*   Lipid Profile: No results for input(s):  CHOL, HDL, LDLCALC, TRIG, CHOLHDL, LDLDIRECT in the last 72 hours. Thyroid Function Tests: No results for input(s): TSH, T4TOTAL, FREET4, T3FREE, THYROIDAB in the last 72 hours. Anemia Panel: No results for input(s): VITAMINB12, FOLATE, FERRITIN, TIBC, IRON, RETICCTPCT in the last 72 hours. Urine analysis:    Component Value Date/Time   COLORURINE AMBER (A) 10/22/2019 0325   APPEARANCEUR HAZY (A) 10/22/2019 0325   LABSPEC >1.046 (H) 10/22/2019 0325   PHURINE 5.0 10/22/2019 0325   GLUCOSEU 50 (A) 10/22/2019 0325   HGBUR NEGATIVE 10/22/2019 0325   BILIRUBINUR NEGATIVE 10/22/2019 0325   KETONESUR NEGATIVE 10/22/2019 0325   PROTEINUR 100 (A) 10/22/2019 0325   NITRITE NEGATIVE 10/22/2019 0325   LEUKOCYTESUR NEGATIVE 10/22/2019 0325   Sepsis Labs: @LABRCNTIP (procalcitonin:4,lacticacidven:4)  ) Recent Results (from the past 240 hour(s))  MRSA PCR Screening     Status: None   Collection Time: 10/22/19  3:24 AM  Specimen: Urine, Suprapubic; Nasopharyngeal  Result Value Ref Range Status   MRSA by PCR NEGATIVE NEGATIVE Final    Comment:        The GeneXpert MRSA Assay (FDA approved for NASAL specimens only), is one component of a comprehensive MRSA colonization surveillance program. It is not intended to diagnose MRSA infection nor to guide or monitor treatment for MRSA infections. Performed at [facility], [address] [place], [place] [address]          Radiology Studies: CT ABDOMEN PELVIS W CONTRAST  Result Date: 10/21/2019 CLINICAL DATA:  Abdomen distension nausea and vomiting EXAM: CT ABDOMEN AND PELVIS WITH CONTRAST TECHNIQUE: Multidetector CT imaging of the abdomen and pelvis was performed using the standard protocol following bolus administration of intravenous contrast. CONTRAST:  128mL OMNIPAQUE IOHEXOL 300 MG/ML  SOLN COMPARISON:  None. FINDINGS: Lower chest: Lung bases demonstrate no acute consolidation or effusion. The heart size is within normal  limits. Circumferential wall thickening of the distal esophagus and GE junction Hepatobiliary: No focal liver abnormality is seen. No gallstones, gallbladder wall thickening, or biliary dilatation. Pancreas: Unremarkable. No pancreatic ductal dilatation or surrounding inflammatory changes. Spleen: Normal in size without focal abnormality. Adrenals/Urinary Tract: Adrenal glands are normal. Kidneys show no hydronephrosis. Suprapubic catheter within the left aspect of bladder. The bladder is decompressed. Stomach/Bowel: The stomach is nonenlarged. Air and fluid-filled loops of small bowel without distention. Prominent appendix size measuring up to 8 mm but no inflammatory changes. Large volume of stool throughout the colon. Focal area of wall thickening at the distal sigmoid colon with narrowed appearance of the lumen, series 4, image number 59. Moderate formed feces at the rectum, associated with rectal wall thickening and edema within the perirectal and presacral fat. Vascular/Lymphatic: Moderate aortic atherosclerosis without aneurysm. Multiple subcentimeter retroperitoneal nodes. Reproductive: Negative for mass Other: No free air.  Fat containing umbilical hernia, small. Musculoskeletal: Partially visualized spinal rods and fixating screws in the lower thoracic spine cage device at T8-T9 with severe presumed chronic deformity at T8 and T9. Probable chronic superior endplate deformities at T12 and L1. IMPRESSION: 1. Scattered fluid and air-filled loops of small bowel without significant distension or obstructive change. Findings could represent a mild ileus. 2. Large volume of stool throughout the colon with impacted feces at the rectum. There is associated rectal wall thickening and perirectal and presacral edema, suspicious for stercoral colitis 3. Focal area of wall thickening and luminal narrowing at the distal sigmoid colon, this may be further evaluated with direct visualization/colonoscopy. 4. Circumferential  wall thickening of the distal esophagus and GE junction, esophagitis versus reflux versus mass less likely. This may be correlated with direct visualization/endoscopy. 5. Incompletely visualized postsurgical changes of the thoracic spine. Deformities at T8 and T9 presumably related to history of discitis and osteomyelitis. Suspect chronic superior endplate deformities at T12 and L1. Electronically Signed   By: Donavan Foil M.D.   On: 10/21/2019 15:47   DG Chest Portable 1 View  Result Date: 10/21/2019 CLINICAL DATA:  COVID-19 positive.  Decreased oxygen saturation EXAM: PORTABLE CHEST 1 VIEW COMPARISON:  October 02, 2019 FINDINGS: Lungs are clear. Heart is upper normal in size with pulmonary vascularity normal. No adenopathy. There are postoperative changes in the thoracic spine. IMPRESSION: Lungs clear.  Stable cardiac silhouette. Electronically Signed   By: Lowella Grip III M.D.   On: 10/21/2019 15:55        Scheduled Meds: . amLODipine  10 mg Oral Daily  . bisacodyl  10 mg Oral Daily  . Chlorhexidine Gluconate Cloth  6 each Topical Daily  . docusate sodium  100 mg Oral BID  . enoxaparin (LOVENOX) injection  40 mg Subcutaneous Q24H  . famotidine  20 mg Oral Daily  . ferrous gluconate  324 mg Oral BID WC  . [START ON 10/24/2019] furosemide  40 mg Oral BID  . hydrALAZINE  50 mg Oral TID  . insulin aspart  0-15 Units Subcutaneous Q4H  . linaclotide  145 mcg Oral QAC breakfast  . loratadine  10 mg Oral Daily  . Melatonin  5 mg Oral QHS  . methocarbamol  1,000 mg Oral QID  . metoCLOPramide  10 mg Oral TID AC  . metoprolol succinate  25 mg Oral Daily  . oxyCODONE  10 mg Oral BID  . polyethylene glycol  17 g Oral BID  . pregabalin  75 mg Oral BID  . traZODone  50 mg Oral QHS   Continuous Infusions: . sodium chloride 100 mL/hr at 10/23/19 1013     LOS: 1 day    Time spent: 25 minutes    Alberteen Sam, MD [facility] 10/23/2019, 2:38 PM     Please page  though AMION or Epic secure chat:  For [employer], Higher education careers adviser

## 2019-10-23 NOTE — NC FL2 (Addendum)
[place] LEVEL OF CARE SCREENING TOOL     IDENTIFICATION  Patient Name: Eugene [name] Birthdate: [birth year] Sex: male Admission Date (Current Location): 10/21/2019  [place] and [place] Number:  Eugene [name] [id number] [place] and Address:  [facility], [address] [place], [place] [address]      Provider Number: [id number]  Attending Physician Name and Address:  Eugene [name], *  Relative Name and Phone Number:  Eugene [name] [phone]    Current Level of Care: Hospital Recommended Level of Care: [place] Prior Approval Number:    Date Approved/Denied:   PASRR Number: [id number]  Discharge Plan: SNF    Current Diagnoses: Patient Active Problem List   Diagnosis Date Noted  . Nausea and vomiting 10/21/2019  . Suprapubic catheter ([place])   . Sepsis ([place]) 10/02/2019  . MRSA bacteremia 02/08/2019  . Osteomyelitis of thoracic spine ([place]) 02/08/2019  . Peripheral vascular disease ([place]) 02/08/2019  . Hypertension 02/08/2019  . Dyslipidemia 02/08/2019  . Chronic pain syndrome 02/08/2019  . Polysubstance abuse ([place]) 02/08/2019  . Chronic hepatitis C without hepatic coma ([place]) 02/08/2019  . GERD (gastroesophageal reflux disease) 02/08/2019  . Cigarette smoker 02/08/2019  . Normocytic anemia 02/08/2019  . UTI (urinary tract infection) 02/07/2019  . AKI (acute kidney injury) ([place]) 02/07/2019  . Hyperkalemia 02/07/2019  . Diabetes ([place]) 02/07/2019  . Pressure injury of skin 02/07/2019  . Hyperlipidemia 11/07/2018  . MRSA (methicillin resistant Staphylococcus aureus) septicemia ([place]) 08/17/2017  . Other acute osteomyelitis, left ankle and foot ([place]) 06/29/2017  . Diabetic ulcer of right foot associated with diabetes mellitus due to underlying condition ([place]) 06/27/2017    Orientation RESPIRATION BLADDER Height & Weight     Self, Time, Situation, Place  Normal Indwelling catheter(supra pubic  cath) Weight: 106.9 kg Height:  6' (182.9 cm)  BEHAVIORAL SYMPTOMS/MOOD NEUROLOGICAL BOWEL NUTRITION STATUS      Incontinent Diet(thin liquid to be advanced see DC summary)  AMBULATORY STATUS COMMUNICATION OF NEEDS Skin   Total Care Verbally Normal                       Personal Care Assistance Level of Assistance  Bathing, Dressing, Feeding Bathing Assistance: Maximum assistance Feeding assistance: Limited assistance Dressing Assistance: Maximum assistance     Functional Limitations Info             SPECIAL CARE FACTORS FREQUENCY                       Contractures Contractures Info: Not present    Additional Factors Info  Code Status, Allergies Code Status Info: Full Code Allergies Info: no known allergies     Isolation Precautions Info: Has been MRSA, VRE, MDR Bacteria, HX COVID +     Current Medications (10/24/2019):  This is the current hospital active medication list Current Facility-Administered Medications  Medication Dose Route Frequency Provider Last Rate Last Admin  . acetaminophen (TYLENOL) tablet 650 mg  650 mg Oral Q6H PRN Jani Gravel, MD   650 mg at 10/22/19 0422   Or  . acetaminophen (TYLENOL) suppository 650 mg  650 mg Rectal Q6H PRN Jani Gravel, MD      . amLODipine (NORVASC) tablet 10 mg  10 mg Oral Daily Jani Gravel, MD   10 mg at 10/24/19 2376  . bisacodyl (DULCOLAX) EC tablet 10 mg  10 mg Oral Daily Jani Gravel, MD   10 mg at 10/24/19 0836  .  bisacodyl (DULCOLAX) suppository 10 mg  10 mg Rectal Daily PRN Pearson Grippe, MD      . Chlorhexidine Gluconate Cloth 2 % PADS 6 each  6 each Topical Daily Pearson Grippe, MD   6 each at 10/23/19 1013  . docusate sodium (COLACE) capsule 100 mg  100 mg Oral BID Pearson Grippe, MD   100 mg at 10/24/19 0839  . enoxaparin (LOVENOX) injection 40 mg  40 mg Subcutaneous Q24H Pearson Grippe, MD   40 mg at 10/23/19 2002  . famotidine (PEPCID) tablet 20 mg  20 mg Oral Daily Pearson Grippe, MD   20 mg at 10/24/19 0837  . ferrous  gluconate (FERGON) tablet 324 mg  324 mg Oral BID WC Pearson Grippe, MD   324 mg at 10/24/19 0840  . furosemide (LASIX) tablet 40 mg  40 mg Oral BID Alberteen Sam, MD   40 mg at 10/24/19 5329  . hydrALAZINE (APRESOLINE) tablet 50 mg  50 mg Oral TID Pearson Grippe, MD   50 mg at 10/24/19 9242  . insulin aspart (novoLOG) injection 0-15 Units  0-15 Units Subcutaneous Q4H Arnetha Courser, MD   3 Units at 10/24/19 [phone]  . linaclotide (LINZESS) capsule 145 mcg  145 mcg Oral QAC breakfast Pearson Grippe, MD   145 mcg at 10/24/19 0840  . loratadine (CLARITIN) tablet 10 mg  10 mg Oral Daily Pearson Grippe, MD   10 mg at 10/24/19 [phone]  . Melatonin TABS 5 mg  5 mg Oral QHS Pearson Grippe, MD   5 mg at 10/23/19 2210  . methocarbamol (ROBAXIN) tablet 1,000 mg  1,000 mg Oral QID Pearson Grippe, MD   1,000 mg at 10/24/19 2297  . metoCLOPramide (REGLAN) tablet 10 mg  10 mg Oral TID AC Doctor Sheahan, Earl Lites, MD   10 mg at 10/24/19 9892  . metoprolol succinate (TOPROL-XL) 24 hr tablet 25 mg  25 mg Oral Daily Pearson Grippe, MD   25 mg at 10/24/19 0837  . mineral oil enema 1 enema  1 enema Rectal Daily PRN Pearson Grippe, MD      . ondansetron [facility]) injection 4 mg  4 mg Intravenous Q6H PRN Pearson Grippe, MD   4 mg at 10/21/19 2106  . oxyCODONE (Oxy IR/ROXICODONE) immediate release tablet 10 mg  10 mg Oral Q4H PRN Pearson Grippe, MD   10 mg at 10/23/19 2003  . oxyCODONE (OXYCONTIN) 12 hr tablet 10 mg  10 mg Oral BID Pearson Grippe, MD   10 mg at 10/24/19 [phone]  . polyethylene glycol (MIRALAX / GLYCOLAX) packet 17 g  17 g Oral BID Arnetha Courser, MD   17 g at 10/24/19 0834  . pregabalin (LYRICA) capsule 75 mg  75 mg Oral BID Pearson Grippe, MD   75 mg at 10/24/19 1740  . traZODone (DESYREL) tablet 50 mg  50 mg Oral QHS Pearson Grippe, MD   50 mg at 10/23/19 2210     Discharge Medications: Please see discharge summary for a list of discharge medications.  Relevant Imaging Results:  Relevant Lab Results:   Additional Information SSN:  [id number]  Alberteen Sam, MD

## 2019-10-24 DIAGNOSIS — Z89511 Acquired absence of right leg below knee: Secondary | ICD-10-CM

## 2019-10-24 DIAGNOSIS — Z89512 Acquired absence of left leg below knee: Secondary | ICD-10-CM

## 2019-10-24 LAB — GLUCOSE, CAPILLARY
Glucose-Capillary: 162 mg/dL — ABNORMAL HIGH (ref 70–99)
Glucose-Capillary: 165 mg/dL — ABNORMAL HIGH (ref 70–99)
Glucose-Capillary: 202 mg/dL — ABNORMAL HIGH (ref 70–99)
Glucose-Capillary: 262 mg/dL — ABNORMAL HIGH (ref 70–99)
Glucose-Capillary: 262 mg/dL — ABNORMAL HIGH (ref 70–99)

## 2019-10-24 MED ORDER — METOCLOPRAMIDE HCL 10 MG PO TABS: 10.0000 mg | ORAL_TABLET | Freq: Three times a day (TID) | ORAL | 1 refills | Status: DC | PRN

## 2019-10-24 MED ORDER — OXYCODONE HCL 10 MG PO TABS: 10.0000 mg | ORAL_TABLET | ORAL | 0 refills | Status: DC | PRN

## 2019-10-24 MED ORDER — LINACLOTIDE 145 MCG PO CAPS: 145.0000 ug | ORAL_CAPSULE | Freq: Every day | ORAL | 0 refills | Status: DC

## 2019-10-24 MED ORDER — METOCLOPRAMIDE HCL 10 MG PO TABS: 10.0000 mg | ORAL_TABLET | Freq: Three times a day (TID) | ORAL | 0 refills | Status: DC

## 2019-10-24 MED ORDER — POLYETHYLENE GLYCOL 3350 17 G PO PACK: 17.0000 g | PACK | Freq: Two times a day (BID) | ORAL | 0 refills | Status: AC

## 2019-10-24 MED ORDER — SENNOSIDES-DOCUSATE SODIUM 8.6-50 MG PO TABS: 1.0000 | ORAL_TABLET | Freq: Two times a day (BID) | ORAL | Status: AC

## 2019-10-24 MED ORDER — OXYCODONE ER 9 MG PO C12A: 9.0000 mg | EXTENDED_RELEASE_CAPSULE | Freq: Two times a day (BID) | ORAL | 0 refills | Status: DC

## 2019-10-24 NOTE — Discharge Summary (Signed)
Physician Discharge Summary  Eugene [name] ZOX:[id number] DOB: [birth year] DOA: 10/21/2019  PCP: System, Pcp Not In  Admit date: 10/21/2019 Discharge date: 10/24/2019  Admitted From: [facility]  Disposition:  [facility]   Recommendations for Outpatient Follow-up:  1. Follow up with GI Dr. Daryel GeraldMah'Moud at [facility] in 2-6 weeks, make appointment --> for evaluation of abnormal CT imaging of esophagus and colon, endoscopy recommended, as well as gastroparesis 2. Please obtain BMP/CBC in one week to follow up electroyltes and Hgb 3. [place]: Please give Reglan PO TID for 5 days, then may reduce to as needed 4. [place]: Please adjust bowel regimen to the below regimen -- titrate up or down as needed to ensure BM every 2-3 days at least     Home Health: N/A  Equipment/Devices: TBD   Discharge Condition: Fair  CODE STATUS: FULL Diet recommendation: Diabetic  Brief/Interim Summary: Mr. Valorie Rooseveltroctor is a 59 y.o. M with DM, HTN, hep C, bilateral BKA, CUR with indwelling SP cath, and MRSA bacteremia with vertebral osteomyelitis in 2020 who presented with N/V for 2 days similar to previous gastroparesis flares.  In the ER, afebrile, tachycardic, otherwise hemodynamically stable, but unable to tolerate PO. CT showed severe constipation with stercoral colitis.     PRINCIPAL HOSPITAL DIAGNOSIS: Gastroparesis    Discharge Diagnoses:   Suspecte gastroparesis flare Patient presented with intractable vomiting, second flare in last month.    Symptoms recurred after stopping Reglan.  He was admitted, started on IV fluids and IV reglan and symptoms improved.    Transitioned to oral Reglan and advanced diet.    Continue Reglan TID on schedule for 1 week, then transition to PRN    Constipation Severely constipated on admission, no BM in >2 weeks.  Got enema night of admission and several large BM after.    Start regimen of new Linzess, plus MiraLAX BID (was once  daily prior) then Senokot-S BID (in place of bisacodyl once daily).  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  No clinical bleeding noted. Continue iron  Stage II pressure injury, coccyx, POA  Chronic pain Continue Lyrica, methocarbamol, oxycodone, Trazodone  IBS Continue linaclotide  Abnormal imaging CT abdomen showed "focal area of wall thickening and luminal narrowing at the distal sigmoid colon," and "circumferential wall thickening of the distal esophagus and GE junction, esophagitis versus reflux versus mass less likely." Endoscopy recommended for both. -Referral to GI          Discharge Instructions  Discharge Instructions    Discharge instructions   Complete by: As directed    From Dr. Maryfrances Bunnellanford: You were admitted for a gastroparesis flare.  You were treated with metoclopramide (Reglan) and this got better. You should take Reglan 3 times daily for the next 5 days After that, you will have Reglan available three times a day as needed.  Figure out how often you need to take it to manage your nausea and reflux.  You should also be seen by a gastroenterologist.  They should evaluate you for gastroparesis, but also for some thickening in your esophagus and colon that needs endoscopy (a procedure)  MAKE SURE this appointment is made in the next 6 weeks  If you start to have severe nausea, your facility can give you either one of two  forms of anti-vomiting medicine that doesn't need to be swallowed: dissolvable Zofran or rectal Phenergan    Next:  For the constipation:  Take Miralax twice daily (this is the one in apple juice that softens stools) Take linaclotide Linzess once daily in the morning (this is the  IBS medicine) Take Senokot S twice daily (this is the stimulant)  If your stools become too frequent, reduce the Linzess first   Aim to have a bowel movement every 2-3 days   Increase activity slowly   Complete by: As directed      Allergies as of 10/24/2019   No Known Allergies     Medication List    STOP taking these medications   docusate sodium 100 MG capsule Commonly known as: COLACE   magnesium hydroxide 400 MG/5ML suspension Commonly known as: MILK OF MAGNESIA     TAKE these medications   acetaminophen 325 MG tablet Commonly known as: TYLENOL Take 650 mg by mouth every 4 (four) hours as needed for mild pain or fever.   amLODipine 10 MG tablet Commonly known as: NORVASC Take 10 mg by mouth daily.   bisacodyl 10 MG suppository Commonly known as: DULCOLAX Place 10 mg rectally daily as needed for moderate constipation. What changed: Another medication with the same name was removed. Continue taking this medication, and follow the directions you see here.   famotidine 20 MG tablet Commonly known as: PEPCID Take 20 mg by mouth daily.   ferrous gluconate 324 MG tablet Commonly known as: FERGON Take 324 mg by mouth 2 (two) times daily with a meal.   furosemide 40 MG tablet Commonly known as: LASIX Take 40 mg by mouth 2 (two) times daily.   hydrALAZINE 50 MG tablet Commonly known as: APRESOLINE Take 50 mg by mouth 3 (three) times daily.   insulin glargine 100 UNIT/ML injection Commonly known as: LANTUS Inject 20 Units into the skin at bedtime.   insulin lispro 100 UNIT/ML injection Commonly known as: HUMALOG Inject 0-15 Units into the skin 4 (four) times daily -  before meals and at bedtime. <150= 0 units 151-200= 3 units 201-250= 6 units 251-300= 9 units 301-350= 12 units 351-400= 15 units >400 call MD   linaclotide 145 MCG Caps capsule Commonly known as: LINZESS Take 1 capsule (145 mcg total) by mouth daily before breakfast. Start taking on:  October 25, 2019   loratadine 10 MG tablet Commonly known as: CLARITIN Take 10 mg by mouth daily.   Melatonin 5 MG Tabs Take 5 mg by mouth at bedtime.   methocarbamol 500 MG tablet Commonly known as: ROBAXIN Take 1,000 mg by mouth 4 (four) times daily.   metoCLOPramide 10 MG tablet Commonly known as: REGLAN Take 1 tablet (10 mg total) by mouth 3 (three) times daily before meals for 5 days. What changed:   when to take this  reasons to take this   metoCLOPramide 10 MG tablet Commonly known as: REGLAN Take 1 tablet (10 mg total) by mouth every 8 (eight) hours as needed for nausea, vomiting or refractory nausea / vomiting. Start taking on: October 29, 2019 What changed: You were already taking a medication with the same name, and this prescription was added. Make sure you understand how and when to take each.   metoprolol succinate 25 MG 24 hr tablet Commonly known as: TOPROL-XL Take 25 mg by mouth daily.   ondansetron 4 MG tablet Commonly known as: ZOFRAN Take 4 mg by mouth every 6 (six) hours  as needed for nausea or vomiting.   oxyCODONE ER 9 MG C12a Take 9 mg by mouth 2 (two) times daily.   Oxycodone HCl 10 MG Tabs Take 1 tablet (10 mg total) by mouth every 4 (four) hours as needed.   polyethylene glycol 17 g packet Commonly known as: MIRALAX / GLYCOLAX Take 17 g by mouth 2 (two) times daily. What changed: when to take this   pregabalin 75 MG capsule Commonly known as: LYRICA Take 75 mg by mouth 2 (two) times daily.   senna-docusate 8.6-50 MG tablet Commonly known as: Senokot-S Take 1 tablet by mouth 2 (two) times daily.   sodium phosphate Pediatric 3.5-9.5 GM/59ML enema Place 1 enema rectally every other day as needed for severe constipation.   traZODone 50 MG tablet Commonly known as: DESYREL Take 50 mg by mouth at bedtime.       Contact information for follow-up providers    Mah'Moud, Quincy Simmonds, MD. Schedule an appointment as soon as possible  for a visit in 2 week(s).   Specialty: Gastroenterology Contact information: [address] [place] [address] [place] [address] [phone]            Contact information for after-discharge care    Destination    [facility] Preferred SNF .   Service: Skilled Nursing Contact information: [address] [place] [address] [phone]                 No Known Allergies  Consultations:     Procedures/Studies: [place] Intraoperative  Result Date: 10/07/2019 INDICATION: History of neurogenic bladder with chronic indwelling Foley catheter with urethral erosion. The patient initially underwent image guided placement of a suprapubic catheter on 09/07/2019 however this catheter was inadvertently removed and as such a definitive new placement was performed on 09/17/2019. Unfortunately the patient returns to the [facility] emergency department with recurrent inadvertent suprapubic catheter displacement. At the time of admission, following my discussion with Dr. Richardo Hanks, the decision was made to proceed with Foley catheter diversion, however unfortunately, the patient is again tolerated the Foley catheter poorly. As such request made for placement of a new image guided suprapubic catheter for urinary diversion purposes. As this is the 3rd image guided suprapubic catheter placement since 09/07/2019, it will likely be our last attempt at percutaneous management for urinary diversion. EXAM: ULTRASOUND AND CT GUIDED SUPRAPUBIC CATHETER PLACEMENT COMPARISON:  Image guided suprapubic catheter placement-09/17/2019; 09/07/2019 MEDICATIONS: None ANESTHESIA/SEDATION: Moderate (conscious) sedation was employed during this procedure. A total of Versed 2 mg and Fentanyl 100 mcg was administered intravenously. Moderate Sedation Time: 14 minutes. The patient's level of consciousness and vital signs were monitored continuously by radiology nursing throughout the procedure under my  direct supervision. CONTRAST:  None COMPLICATIONS: None immediate. PROCEDURE: The procedure, risks, benefits, and alternatives were explained to the patient. Questions regarding the procedure were encouraged and answered. The patient understands and consents to the procedure. A timeout was performed prior to the initiation of the procedure. The patient was positioned supine on the CT gantry. Preprocedural imaging was obtained of the lower pelvis. The skin overlying the anterior aspect the lower pelvis was prepped and draped in usual sterile fashion. The patient's the urinary bladder was then distended with the administration of approximately 350 cc of saline via the existing Foley catheter. Under direct ultrasound guidance, a 22 gauge needle was utilized for the purposes of procedural planning after the overlying soft tissues were anesthetized with 1% lidocaine. Next, an 68  gauge trocar needle was advanced into the urinary bladder under direct ultrasound guidance. Ultrasound image was saved for procedural documentation purposes. A short Amplatz wire was coiled within the urinary bladder. Appropriate position was confirmed with a limited pelvic CT. The track was serially dilated ultimately allowing placement of a 14 French all-purpose drainage catheter with end coiled and locked within the urinary bladder. The catheter was connected to a gravity bag. The exit site of the catheter was secured within interrupted suture. A dressing was placed. The patient tolerated the procedure well without immediate postprocedural complication. FINDINGS: CT imaging demonstrates decompression of the urinary bladder via a Foley catheter. There is no bowel interposed between the anterior aspect of the urinary bladder and the ventral lower abdominal/pelvic wall. After distension of the urinary bladder with the administration of saline via the Foley catheter, ultrasound and CT was utilized for the placement of a 14 French percutaneous  drainage catheter with end coiled and locked within the urinary bladder. IMPRESSION: Technically successful ultrasound and CT-guided placement of a 14 French suprapubic catheter. PLAN: Recommend fluoroscopic guided exchange and up sizing of this suprapubic drainage catheter in approximately 6-8 weeks. Electronically Signed   By: Simonne Come M.D.   On: 10/07/2019 16:06   CT ABDOMEN PELVIS W CONTRAST  Result Date: 10/21/2019 CLINICAL DATA:  Abdomen distension nausea and vomiting EXAM: CT ABDOMEN AND PELVIS WITH CONTRAST TECHNIQUE: Multidetector CT imaging of the abdomen and pelvis was performed using the standard protocol following bolus administration of intravenous contrast. CONTRAST:  OMNIPAQUE IOHEXOL 300 MG/ML  SOLN COMPARISON:  None. FINDINGS: Lower chest: Lung bases demonstrate no acute consolidation or effusion. The heart size is within normal limits. Circumferential wall thickening of the distal esophagus and GE junction Hepatobiliary: No focal liver abnormality is seen. No gallstones, gallbladder wall thickening, or biliary dilatation. Pancreas: Unremarkable. No pancreatic ductal dilatation or surrounding inflammatory changes. Spleen: Normal in size without focal abnormality. Adrenals/Urinary Tract: Adrenal glands are normal. Kidneys show no hydronephrosis. Suprapubic catheter within the left aspect of bladder. The bladder is decompressed. Stomach/Bowel: The stomach is nonenlarged. Air and fluid-filled loops of small bowel without distention. Prominent appendix size measuring up to 8 mm but no inflammatory changes. Large volume of stool throughout the colon. Focal area of wall thickening at the distal sigmoid colon with narrowed appearance of the lumen, series 4, image number 59. Moderate formed feces at the rectum, associated with rectal wall thickening and edema within the perirectal and presacral fat. Vascular/Lymphatic: Moderate aortic atherosclerosis without aneurysm. Multiple subcentimeter  retroperitoneal nodes. Reproductive: Negative for mass Other: No free air.  Fat containing umbilical hernia, small. Musculoskeletal: Partially visualized spinal rods and fixating screws in the lower thoracic spine cage device at T8-T9 with severe presumed chronic deformity at T8 and T9. Probable chronic superior endplate deformities at T12 and L1. IMPRESSION: 1. Scattered fluid and air-filled loops of small bowel without significant distension or obstructive change. Findings could represent a mild ileus. 2. Large volume of stool throughout the colon with impacted feces at the rectum. There is associated rectal wall thickening and perirectal and presacral edema, suspicious for stercoral colitis 3. Focal area of wall thickening and luminal narrowing at the distal sigmoid colon, this may be further evaluated with direct visualization/colonoscopy. 4. Circumferential wall thickening of the distal esophagus and GE junction, esophagitis versus reflux versus mass less likely. This may be correlated with direct visualization/endoscopy. 5. Incompletely visualized postsurgical changes of the thoracic spine. Deformities at T8 and T9 presumably  related to history of discitis and osteomyelitis. Suspect chronic superior endplate deformities at T12 and L1. Electronically Signed   By: Donavan Foil M.D.   On: 10/21/2019 15:47   DG Chest Portable 1 View  Result Date: 10/21/2019 CLINICAL DATA:  COVID-19 positive.  Decreased oxygen saturation EXAM: PORTABLE CHEST 1 VIEW COMPARISON:  October 02, 2019 FINDINGS: Lungs are clear. Heart is upper normal in size with pulmonary vascularity normal. No adenopathy. There are postoperative changes in the thoracic spine. IMPRESSION: Lungs clear.  Stable cardiac silhouette. Electronically Signed   By: Lowella Grip III M.D.   On: 10/21/2019 15:55   DG Chest Port 1 View  Result Date: 10/02/2019 CLINICAL DATA:  Sepsis and a COVID-19 positive patient. EXAM: PORTABLE CHEST 1 VIEW COMPARISON:   Single-view of the chest 02/09/2019. FINDINGS: The lungs are clear. Heart size is normal. No pneumothorax or pleural effusion. Postoperative change thoracic fusion and lower thoracic corpectomy noted. No acute bony abnormality IMPRESSION: No acute disease. Electronically Signed   By: Inge Rise M.D.   On: 10/02/2019 07:18   CT IMAGE GUIDED DRAINAGE BY PERCUTANEOUS CATHETER  Result Date: 10/07/2019 INDICATION: History of neurogenic bladder with chronic indwelling Foley catheter with urethral erosion. The patient initially underwent image guided placement of a suprapubic catheter on 09/07/2019 however this catheter was inadvertently removed and as such a definitive new placement was performed on 09/17/2019. Unfortunately the patient returns to the [facility] emergency department with recurrent inadvertent suprapubic catheter displacement. At the time of admission, following my discussion with Dr. Diamantina Providence, the decision was made to proceed with Foley catheter diversion, however unfortunately, the patient is again tolerated the Foley catheter poorly. As such request made for placement of a new image guided suprapubic catheter for urinary diversion purposes. As this is the 3rd image guided suprapubic catheter placement since 09/07/2019, it will likely be our last attempt at percutaneous management for urinary diversion. EXAM: ULTRASOUND AND CT GUIDED SUPRAPUBIC CATHETER PLACEMENT COMPARISON:  Image guided suprapubic catheter placement-09/17/2019; 09/07/2019 MEDICATIONS: None ANESTHESIA/SEDATION: Moderate (conscious) sedation was employed during this procedure. A total of Versed 2 mg and Fentanyl 100 mcg was administered intravenously. Moderate Sedation Time: 14 minutes. The patient's level of consciousness and vital signs were monitored continuously by radiology nursing throughout the procedure under my direct supervision. CONTRAST:  None COMPLICATIONS: None immediate. PROCEDURE: The procedure, risks, benefits,  and alternatives were explained to the patient. Questions regarding the procedure were encouraged and answered. The patient understands and consents to the procedure. A timeout was performed prior to the initiation of the procedure. The patient was positioned supine on the CT gantry. Preprocedural imaging was obtained of the lower pelvis. The skin overlying the anterior aspect the lower pelvis was prepped and draped in usual sterile fashion. The patient's the urinary bladder was then distended with the administration of approximately 350 cc of saline via the existing Foley catheter. Under direct ultrasound guidance, a 22 gauge needle was utilized for the purposes of procedural planning after the overlying soft tissues were anesthetized with 1% lidocaine. Next, an 6 gauge trocar needle was advanced into the urinary bladder under direct ultrasound guidance. Ultrasound image was saved for procedural documentation purposes. A short Amplatz wire was coiled within the urinary bladder. Appropriate position was confirmed with a limited pelvic CT. The track was serially dilated ultimately allowing placement of a 14 French all-purpose drainage catheter with end coiled and locked within the urinary bladder. The catheter was connected to a gravity bag. The exit  site of the catheter was secured within interrupted suture. A dressing was placed. The patient tolerated the procedure well without immediate postprocedural complication. FINDINGS: CT imaging demonstrates decompression of the urinary bladder via a Foley catheter. There is no bowel interposed between the anterior aspect of the urinary bladder and the ventral lower abdominal/pelvic wall. After distension of the urinary bladder with the administration of saline via the Foley catheter, ultrasound and CT was utilized for the placement of a 14 French percutaneous drainage catheter with end coiled and locked within the urinary bladder. IMPRESSION: Technically successful  ultrasound and CT-guided placement of a 14 French suprapubic catheter. PLAN: Recommend fluoroscopic guided exchange and up sizing of this suprapubic drainage catheter in approximately 6-8 weeks. Electronically Signed   By: Simonne Come M.D.   On: 10/07/2019 16:06       Subjective: Feeling well.  Appetite okay, no reflux or vomiting.  No abdominal pain.  Discharge Exam: Vitals:   10/23/19 2152 10/24/19 0742  BP: 111/82 119/66  Pulse: 60 (!) 57  Resp:  18  Temp: 98.3 F (36.8 C) 98.4 F (36.9 C)  SpO2: 96% 97%   Vitals:   10/23/19 1511 10/23/19 2152 10/24/19 0402 10/24/19 0742  BP: 110/71 111/82  119/66  Pulse: 61 60  (!) 57  Resp: 20   18  Temp: 98.6 F (37 C) 98.3 F (36.8 C)  98.4 F (36.9 C)  TempSrc: Oral Oral  Oral  SpO2: 97% 96%  97%  Weight:   106.9 kg   Height:        General: Pt is alert, awake, not in acute distress, has bilateral BKA Cardiovascular: RRR, nl S1-S2, no murmurs appreciated.   No LE. Respiratory: Normal respiratory rate and rhythm.  CTAB without rales or wheezes. Abdominal: Abdomen soft and non-tender.  No distension or HSM.   Neuro/Psych: Strength symmetric in upper and lower extremities.  Judgment and insight appear normal.   The results of significant diagnostics from this hospitalization (including imaging, microbiology, ancillary and laboratory) are listed below for reference.     Microbiology: Recent Results (from the past 240 hour(s))  MRSA PCR Screening     Status: None   Collection Time: 10/22/19  3:24 AM   Specimen: Urine, Suprapubic; Nasopharyngeal  Result Value Ref Range Status   MRSA by PCR NEGATIVE NEGATIVE Final    Comment:        The GeneXpert MRSA Assay (FDA approved for NASAL specimens only), is one component of a comprehensive MRSA colonization surveillance program. It is not intended to diagnose MRSA infection nor to guide or monitor treatment for MRSA infections. Performed at [facility], [address] [place], [place] [address]      Labs: BNP (last 3 results) No results for input(s): BNP in the last 8760 hours. Basic Metabolic Panel: Recent Labs  Lab 10/21/19 1344 10/22/19 0549 10/23/19 0437  NA 139 140 139  K 4.0 3.6 3.8  CL 102 105 104  CO2 24 26 25   GLUCOSE 296* 208* 102*  BUN 24* 25* 23*  CREATININE 1.33* 1.23 1.24  CALCIUM 9.4 8.7* 8.5*   Liver Function Tests: Recent Labs  Lab 10/21/19 1344 10/22/19 0549 10/23/19 0437  AST 31 22 48*  ALT 37 29 59*  ALKPHOS 71 66 57  BILITOT 1.3* 0.9 1.0  PROT 8.3* 6.9 6.2*  ALBUMIN 4.0 3.4* 3.2*   Recent Labs  Lab 10/21/19 1344  LIPASE 13   No results for input(s): AMMONIA  in the last 168 hours. CBC: Recent Labs  Lab 10/21/19 1344 10/22/19 0549 10/23/19 0437  WBC 13.4* 8.5 6.0  NEUTROABS 11.4*  --  2.3  HGB 13.0 11.2* 10.6*  HCT 37.9* 33.0* 31.8*  MCV 89.4 90.7 92.2  PLT 243 196 174   Cardiac Enzymes: No results for input(s): CKTOTAL, CKMB, CKMBINDEX, TROPONINI in the last 168 hours. BNP: Invalid input(s): POCBNP CBG: Recent Labs  Lab 10/23/19 1646 10/23/19 1950 10/23/19 2359 10/24/19 0400 10/24/19 0739  GLUCAP 227* 258* 202* 162* 165*   D-Dimer No results for input(s): DDIMER in the last 72 hours. Hgb A1c Recent Labs    10/22/19 0549  HGBA1C 8.3*   Lipid Profile No results for input(s): CHOL, HDL, LDLCALC, TRIG, CHOLHDL, LDLDIRECT in the last 72 hours. Thyroid function studies No results for input(s): TSH, T4TOTAL, T3FREE, THYROIDAB in the last 72 hours.  Invalid input(s): FREET3 Anemia work up No results for input(s): VITAMINB12, FOLATE, FERRITIN, TIBC, IRON, RETICCTPCT in the last 72 hours. Urinalysis    Component Value Date/Time   COLORURINE AMBER (A) 10/22/2019 0325   APPEARANCEUR HAZY (A) 10/22/2019 0325   LABSPEC >1.046 (H) 10/22/2019 0325   PHURINE 5.0 10/22/2019 0325   GLUCOSEU 50 (A) 10/22/2019 0325   HGBUR NEGATIVE 10/22/2019 0325   BILIRUBINUR NEGATIVE 10/22/2019 0325    KETONESUR NEGATIVE 10/22/2019 0325   PROTEINUR 100 (A) 10/22/2019 0325   NITRITE NEGATIVE 10/22/2019 0325   LEUKOCYTESUR NEGATIVE 10/22/2019 0325   Sepsis Labs Invalid input(s): PROCALCITONIN,  WBC,  LACTICIDVEN Microbiology Recent Results (from the past 240 hour(s))  MRSA PCR Screening     Status: None   Collection Time: 10/22/19  3:24 AM   Specimen: Urine, Suprapubic; Nasopharyngeal  Result Value Ref Range Status   MRSA by PCR NEGATIVE NEGATIVE Final    Comment:        The GeneXpert MRSA Assay (FDA approved for NASAL specimens only), is one component of a comprehensive MRSA colonization surveillance program. It is not intended to diagnose MRSA infection nor to guide or monitor treatment for MRSA infections. Performed at [facility], [address] [place], [place] [address]      Time coordinating discharge: 35 minutes The [place] controlled substances registry was reviewed for this patient prior to filling the <5 days supply controlled substances script.      SIGNED:   Alberteen Sam, MD  [facility] 10/24/2019, 9:19 AM

## 2019-10-24 NOTE — Progress Notes (Signed)
EMS/911 contacted via telephone call for nonemergency transport from Room 143 to [facility]; pt's discharge pending their arrival

## 2019-10-24 NOTE — TOC Progression Note (Addendum)
Transition of Care [facility]) - Progression Note    Patient Details  Name: Eugene [name] MRN: [id number] Date of Birth: April 08, 1961  Transition of Care [facility]) CM/SW Contact  Dominic Pea, LCSW Phone Number: 10/24/2019, 12:16 PM  Clinical Narrative:    Patient medically cleared for discharge today to return to [facility]) SNF. Patient updated of discharge via phone and confirmed he does not need LCSW to contact family. Confirmed with [name] in admissions at [facility] of patient's return. [name] also confirmed they will be able to give medication tonight. Sent D/S summary via hub. Updated RN Melina Schools patient will be going to room 39 and report can be called to [phone]. RN Melina Schools to remind about medication needed to be given tonight when calling report. Med necessity form, facesheet and prescriptions in transport packet on chart. No further needs assessed. TOC signing off.    Expected Discharge Plan: Skilled Nursing Facility Barriers to Discharge: No Barriers Identified  Expected Discharge Plan and Services Expected Discharge Plan: Skilled Nursing Facility In-house Referral: Clinical Social Work   Post Acute Care Choice: Skilled Nursing Facility Living arrangements for the past 2 months: Skilled Nursing Facility Expected Discharge Date: 10/24/19                                     Social Determinants of Health (SDOH) Interventions    Readmission Risk Interventions Readmission Risk Prevention Plan 10/05/2019 03/04/2019  Transportation Screening Complete Complete  Medication Review [name]) Complete Complete  PCP or Specialist appointment within 3-5 days of discharge Complete -  HRI or Home Care Consult Complete -  SW Recovery Care/Counseling Consult Complete -  Palliative Care Screening Not Applicable Not Applicable  Skilled Nursing Facility Complete Complete

## 2019-10-24 NOTE — Progress Notes (Signed)
MD order received in [facility] to discharge pt to SNF/[facility] today; TOC previously established discharge packet for [facility] and EMS for nonemergency transport; telephone report called to [facility] [phone] for Room 39, no questions voiced at this time; pt's discharge pending arrival of EMS for nonemergency transport

## 2019-12-02 ENCOUNTER — Emergency Department: Payer: Medicaid Other

## 2019-12-02 ENCOUNTER — Ambulatory Visit: Payer: Medicaid Other | Admitting: Urology

## 2019-12-02 ENCOUNTER — Inpatient Hospital Stay
Admission: EM | Admit: 2019-12-02 | Discharge: 2019-12-14 | DRG: 074 | Disposition: A | Discharge status: Skilled Nursing Facility | Payer: Medicaid Other | Source: Skilled Nursing Facility | Attending: Internal Medicine | Admitting: Internal Medicine

## 2019-12-02 ENCOUNTER — Encounter: Payer: Self-pay | Admitting: Emergency Medicine

## 2019-12-02 DIAGNOSIS — K2100 Gastro-esophageal reflux disease with esophagitis, without bleeding: Secondary | ICD-10-CM | POA: Diagnosis present

## 2019-12-02 DIAGNOSIS — T83010A Breakdown (mechanical) of cystostomy catheter, initial encounter: Secondary | ICD-10-CM

## 2019-12-02 DIAGNOSIS — E876 Hypokalemia: Secondary | ICD-10-CM | POA: Diagnosis not present

## 2019-12-02 DIAGNOSIS — Z89511 Acquired absence of right leg below knee: Secondary | ICD-10-CM

## 2019-12-02 DIAGNOSIS — Y738 Miscellaneous gastroenterology and urology devices associated with adverse incidents, not elsewhere classified: Secondary | ICD-10-CM | POA: Diagnosis not present

## 2019-12-02 DIAGNOSIS — Z794 Long term (current) use of insulin: Secondary | ICD-10-CM

## 2019-12-02 DIAGNOSIS — F17210 Nicotine dependence, cigarettes, uncomplicated: Secondary | ICD-10-CM | POA: Diagnosis present

## 2019-12-02 DIAGNOSIS — Q438 Other specified congenital malformations of intestine: Secondary | ICD-10-CM

## 2019-12-02 DIAGNOSIS — E785 Hyperlipidemia, unspecified: Secondary | ICD-10-CM | POA: Diagnosis present

## 2019-12-02 DIAGNOSIS — G8929 Other chronic pain: Secondary | ICD-10-CM

## 2019-12-02 DIAGNOSIS — K3184 Gastroparesis: Secondary | ICD-10-CM | POA: Diagnosis present

## 2019-12-02 DIAGNOSIS — Z8249 Family history of ischemic heart disease and other diseases of the circulatory system: Secondary | ICD-10-CM

## 2019-12-02 DIAGNOSIS — G894 Chronic pain syndrome: Secondary | ICD-10-CM | POA: Diagnosis present

## 2019-12-02 DIAGNOSIS — N319 Neuromuscular dysfunction of bladder, unspecified: Secondary | ICD-10-CM | POA: Diagnosis present

## 2019-12-02 DIAGNOSIS — K589 Irritable bowel syndrome without diarrhea: Secondary | ICD-10-CM | POA: Diagnosis present

## 2019-12-02 DIAGNOSIS — Z79890 Hormone replacement therapy (postmenopausal): Secondary | ICD-10-CM

## 2019-12-02 DIAGNOSIS — E1143 Type 2 diabetes mellitus with diabetic autonomic (poly)neuropathy: Principal | ICD-10-CM | POA: Diagnosis present

## 2019-12-02 DIAGNOSIS — Z833 Family history of diabetes mellitus: Secondary | ICD-10-CM

## 2019-12-02 DIAGNOSIS — K5909 Other constipation: Secondary | ICD-10-CM | POA: Diagnosis present

## 2019-12-02 DIAGNOSIS — K5901 Slow transit constipation: Secondary | ICD-10-CM

## 2019-12-02 DIAGNOSIS — F419 Anxiety disorder, unspecified: Secondary | ICD-10-CM | POA: Diagnosis present

## 2019-12-02 DIAGNOSIS — Z6832 Body mass index (BMI) 32.0-32.9, adult: Secondary | ICD-10-CM

## 2019-12-02 DIAGNOSIS — K6289 Other specified diseases of anus and rectum: Secondary | ICD-10-CM | POA: Diagnosis present

## 2019-12-02 DIAGNOSIS — E1151 Type 2 diabetes mellitus with diabetic peripheral angiopathy without gangrene: Secondary | ICD-10-CM | POA: Diagnosis present

## 2019-12-02 DIAGNOSIS — Z89512 Acquired absence of left leg below knee: Secondary | ICD-10-CM

## 2019-12-02 DIAGNOSIS — B1920 Unspecified viral hepatitis C without hepatic coma: Secondary | ICD-10-CM | POA: Diagnosis present

## 2019-12-02 DIAGNOSIS — E1110 Type 2 diabetes mellitus with ketoacidosis without coma: Secondary | ICD-10-CM | POA: Diagnosis present

## 2019-12-02 DIAGNOSIS — K5641 Fecal impaction: Secondary | ICD-10-CM | POA: Diagnosis present

## 2019-12-02 DIAGNOSIS — K08109 Complete loss of teeth, unspecified cause, unspecified class: Secondary | ICD-10-CM | POA: Diagnosis present

## 2019-12-02 DIAGNOSIS — I160 Hypertensive urgency: Secondary | ICD-10-CM | POA: Diagnosis present

## 2019-12-02 DIAGNOSIS — Z981 Arthrodesis status: Secondary | ICD-10-CM

## 2019-12-02 DIAGNOSIS — T83090A Other mechanical complication of cystostomy catheter, initial encounter: Secondary | ICD-10-CM | POA: Diagnosis not present

## 2019-12-02 DIAGNOSIS — E669 Obesity, unspecified: Secondary | ICD-10-CM | POA: Diagnosis present

## 2019-12-02 DIAGNOSIS — F329 Major depressive disorder, single episode, unspecified: Secondary | ICD-10-CM | POA: Diagnosis present

## 2019-12-02 DIAGNOSIS — Z79899 Other long term (current) drug therapy: Secondary | ICD-10-CM

## 2019-12-02 DIAGNOSIS — Z20822 Contact with and (suspected) exposure to covid-19: Secondary | ICD-10-CM | POA: Diagnosis present

## 2019-12-02 DIAGNOSIS — R112 Nausea with vomiting, unspecified: Secondary | ICD-10-CM | POA: Diagnosis present

## 2019-12-02 DIAGNOSIS — R52 Pain, unspecified: Secondary | ICD-10-CM

## 2019-12-02 LAB — COMPREHENSIVE METABOLIC PANEL
ALT: 61 U/L — ABNORMAL HIGH (ref 0–44)
AST: 29 U/L (ref 15–41)
Albumin: 4.1 g/dL (ref 3.5–5.0)
Alkaline Phosphatase: 102 U/L (ref 38–126)
Anion gap: 16 — ABNORMAL HIGH (ref 5–15)
BUN: 21 mg/dL — ABNORMAL HIGH (ref 6–20)
CO2: 19 mmol/L — ABNORMAL LOW (ref 22–32)
Calcium: 9.3 mg/dL (ref 8.9–10.3)
Chloride: 103 mmol/L (ref 98–111)
Creatinine, Ser: 1.07 mg/dL (ref 0.61–1.24)
GFR calc Af Amer: >60 mL/min (ref >60)
GFR calc non Af Amer: >60 mL/min (ref >60)
Glucose, Bld: 261 mg/dL — ABNORMAL HIGH (ref 70–99)
Potassium: 4.1 mmol/L (ref 3.5–5.1)
Sodium: 138 mmol/L (ref 135–145)
Total Bilirubin: 1.2 mg/dL (ref 0.3–1.2)
Total Protein: 8.1 g/dL (ref 6.5–8.1)

## 2019-12-02 LAB — LIPASE, BLOOD: Lipase: 14 U/L (ref 11–51)

## 2019-12-02 LAB — CBC
HCT: 41.1 % (ref 39.0–52.0)
Hemoglobin: 13.9 g/dL (ref 13.0–17.0)
MCH: 30.9 pg (ref 26.0–34.0)
MCHC: 33.8 g/dL (ref 30.0–36.0)
MCV: 91.3 fL (ref 80.0–100.0)
Platelets: 210 10*3/uL (ref 150–400)
RBC: 4.5 MIL/uL (ref 4.22–5.81)
RDW: 12.7 % (ref 11.5–15.5)
WBC: 11.6 10*3/uL — ABNORMAL HIGH (ref 4.0–10.5)
nRBC: 0 % (ref 0.0–0.2)

## 2019-12-02 MED ORDER — IOHEXOL 300 MG/ML  SOLN
100.0000 mL | Freq: Once | INTRAMUSCULAR | Status: AC | PRN
  Administered 2019-12-02: 100 mL via INTRAVENOUS

## 2019-12-02 MED ORDER — PROMETHAZINE HCL 25 MG/ML IJ SOLN
12.5000 mg | Freq: Once | INTRAMUSCULAR | Status: AC
  Administered 2019-12-02: 12.5 mg via INTRAVENOUS

## 2019-12-02 MED ORDER — MORPHINE SULFATE (PF) 4 MG/ML IV SOLN
4.0000 mg | Freq: Once | INTRAVENOUS | Status: AC
  Administered 2019-12-02: 22:00:00 4 mg via INTRAVENOUS
  Filled 2019-12-02: qty 1

## 2019-12-02 MED ORDER — IOHEXOL 350 MG/ML SOLN: 75.0000 mL | Freq: Once | INTRAVENOUS | Status: DC | PRN

## 2019-12-02 MED ORDER — METOCLOPRAMIDE HCL 5 MG/ML IJ SOLN
10.0000 mg | Freq: Once | INTRAMUSCULAR | Status: AC
  Administered 2019-12-02: 10 mg via INTRAVENOUS
  Filled 2019-12-02: qty 2

## 2019-12-02 NOTE — ED Notes (Signed)
CT called for scan now that IV team placed 20g above wrist.

## 2019-12-02 NOTE — ED Provider Notes (Signed)
[facility] Emergency Department Provider Note   ____________________________________________    I have reviewed the triage vital signs and the nursing notes.   HISTORY  Chief Complaint Abdominal pain nausea vomiting    HPI Eugene [name] is a 59 y.o. male with history of diabetes, chronic pain syndrome, stercoral colitis, spinal stenosis, anxiety who presents with complaints of nausea and vomiting.  Patient reports he has been vomiting all day and that his abdomen is swollen and cramping.  Review of medications demonstrates the patient takes oxycodone multiple times per day for chronic pain syndrome.  Denies fevers, does report chills.  Received Zofran 4 mg IM via EMS.  Review of medical record demonstrates recent admission for stercoral colitis/constipation.  Past Medical History:  Diagnosis Date  . Anxiety   . Depression   . Diabetes mellitus without complication ([place])   . Hepatitis C   . Hyperlipidemia   . IBS (irritable bowel syndrome)   . Osteomyelitis ([place])   . Spinal stenosis     Patient Active Problem List   Diagnosis Date Noted  . Intractable nausea and vomiting 12/03/2019  . Nausea and vomiting 10/21/2019  . Suprapubic catheter ([place])   . Sepsis ([place]) 10/02/2019  . MRSA bacteremia 02/08/2019  . Osteomyelitis of thoracic spine ([place]) 02/08/2019  . Peripheral vascular disease ([place]) 02/08/2019  . Hypertension 02/08/2019  . Dyslipidemia 02/08/2019  . Chronic pain syndrome 02/08/2019  . Polysubstance abuse ([place]) 02/08/2019  . Chronic hepatitis C without hepatic coma ([place]) 02/08/2019  . GERD (gastroesophageal reflux disease) 02/08/2019  . Cigarette smoker 02/08/2019  . Normocytic anemia 02/08/2019  . UTI (urinary tract infection) 02/07/2019  . AKI (acute kidney injury) ([place]) 02/07/2019  . Hyperkalemia 02/07/2019  . Diabetes ([place]) 02/07/2019  . Pressure injury of skin 02/07/2019  . Hyperlipidemia 11/07/2018  . MRSA (methicillin  resistant Staphylococcus aureus) septicemia ([place]) 08/17/2017  . Other acute osteomyelitis, left ankle and foot ([place]) 06/29/2017  . Diabetic ulcer of right foot associated with diabetes mellitus due to underlying condition ([place]) 06/27/2017    Past Surgical History:  Procedure Laterality Date  . bilateral amputation Bilateral   . CENTRAL LINE INSERTION-TUNNELED N/A 02/02/2019   Procedure: CENTRAL LINE INSERTION-TUNNELED;  Surgeon: Algernon Huxley, MD;  Location: [place] [facility];  Service: Cardiovascular;  Laterality: N/A;  . SPINAL FUSION    . THORACIC SPINE SURGERY  01/2019   extensive washout    Prior to Admission medications   Medication Sig Start Date End Date Taking? Authorizing Provider  acetaminophen (TYLENOL) 325 MG tablet Take 650 mg by mouth every 4 (four) hours as needed for mild pain or fever.   Yes [provider]  amLODipine (NORVASC) 10 MG tablet Take 10 mg by mouth daily.   Yes [provider]  bisacodyl (DULCOLAX) 10 MG suppository Place 10 mg rectally daily as needed for moderate constipation.   Yes [provider]  famotidine (PEPCID) 20 MG tablet Take 20 mg by mouth daily.    Yes [provider]  ferrous gluconate (FERGON) 324 MG tablet Take 324 mg by mouth 2 (two) times daily with a meal.    Yes [provider]  furosemide (LASIX) 40 MG tablet Take 40 mg by mouth 2 (two) times daily.   Yes [provider]  hydrALAZINE (APRESOLINE) 50 MG tablet Take 50 mg by mouth 3 (three) times daily.   Yes [provider]  insulin glargine (LANTUS) 100 UNIT/ML injection Inject 20 Units  into the skin at bedtime.    Yes [provider]  insulin lispro (HUMALOG) 100 UNIT/ML injection Inject 0-15 Units into the skin 4 (four) times daily -  before meals and at bedtime. <150= 0 units 151-200= 3 units 201-250= 6 units 251-300= 9 units 301-350= 12 units 351-400= 15 units >400 call MD   Yes [provider]    linaclotide (LINZESS) 145 MCG CAPS capsule Take 1 capsule (145 mcg total) by mouth daily before breakfast. 10/25/19  Yes Danford, Earl Lites, MD  loratadine (CLARITIN) 10 MG tablet Take 10 mg by mouth daily.   Yes [provider]  Melatonin 5 MG TABS Take 5 mg by mouth at bedtime.   Yes [provider]  methocarbamol (ROBAXIN) 500 MG tablet Take 1,000 mg by mouth 4 (four) times daily.    Yes [provider]  metoCLOPramide (REGLAN) 10 MG tablet Take 1 tablet (10 mg total) by mouth every 8 (eight) hours as needed for nausea, vomiting or refractory nausea / vomiting. 10/29/19 10/28/20 Yes Danford, Earl Lites, MD  metoprolol succinate (TOPROL-XL) 25 MG 24 hr tablet Take 25 mg by mouth daily.   Yes [provider]  nicotine (NICODERM CQ - DOSED IN MG/24 HOURS) 21 mg/24hr patch Place 21 mg onto the skin daily.   Yes [provider]  ondansetron (ZOFRAN) 4 MG tablet Take 4 mg by mouth every 6 (six) hours as needed for nausea or vomiting.   Yes [provider]  oxyCODONE ER 9 MG C12A Take 9 mg by mouth 2 (two) times daily. 10/24/19  Yes Danford, Earl Lites, MD  Oxycodone HCl 10 MG TABS Take 1 tablet (10 mg total) by mouth every 4 (four) hours as needed. Patient taking differently: Take 10 mg by mouth every 4 (four) hours as needed (pain).  10/24/19  Yes Danford, Earl Lites, MD  polyethylene glycol (MIRALAX / GLYCOLAX) 17 g packet Take 17 g by mouth 2 (two) times daily. 10/24/19  Yes Danford, Earl Lites, MD  pregabalin (LYRICA) 75 MG capsule Take 75 mg by mouth 2 (two) times daily.   Yes [provider]  senna-docusate (SENOKOT-S) 8.6-50 MG tablet Take 1 tablet by mouth 2 (two) times daily. 10/24/19  Yes Danford, Earl Lites, MD  sodium phosphate Pediatric (FLEET) 3.5-9.5 GM/59ML enema Place 1 enema rectally every other day as needed for severe constipation.   Yes [provider]  traZODone (DESYREL) 50 MG tablet Take 50 mg by  mouth at bedtime.    Yes [provider]     Allergies Patient has no known allergies.  History reviewed. No pertinent family history.  Social History Social History   Tobacco Use  . Smoking status: Current Every Day Smoker    Packs/day: 0.25    Types: Cigarettes  . Smokeless tobacco: Never Used  Substance Use Topics  . Alcohol use: Not Currently  . Drug use: Not Currently    Review of Systems  Constitutional: Positive chills Eyes: No visual changes.  ENT: No sore throat. Cardiovascular: Denies chest pain. Respiratory: Denies shortness of breath. Gastrointestinal: As above Genitourinary: Negative for dysuria. Musculoskeletal: Negative for back pain. Skin: Negative for rash. Neurological: Negative for headaches   ____________________________________________   PHYSICAL EXAM:  VITAL SIGNS: ED Triage Vitals  Enc Vitals Group     BP      Pulse      Resp      Temp      Temp src  SpO2      Weight      Height      Head Circumference      Peak Flow      Pain Score      Pain Loc      Pain Edu?      Excl. in GC?     Constitutional: Alert and oriented.   Nose: No congestion/rhinnorhea. Mouth/Throat: Mucous membranes are moist.    Cardiovascular: Normal rate, regular rhythm.  Good peripheral circulation. Respiratory: Normal respiratory effort.  No retractions. Lungs CTAB. Gastrointestinal: Soft, distended, no CVA tenderness  Musculoskeletal: Bilateral BKA.  Warm and well perfused Neurologic:  Normal speech and language. No gross focal neurologic deficits are appreciated.  Skin:  Skin is warm, dry and intact. No rash noted. Psychiatric: Mood and affect are normal. Speech and behavior are normal.  ____________________________________________   LABS (all labs ordered are listed, but only abnormal results are displayed)  Labs Reviewed  MRSA PCR SCREENING - Abnormal; Notable for the following components:      Result Value   MRSA by PCR  POSITIVE (*)    All other components within normal limits  CBC - Abnormal; Notable for the following components:   WBC 11.6 (*)    All other components within normal limits  COMPREHENSIVE METABOLIC PANEL - Abnormal; Notable for the following components:   CO2 19 (*)    Glucose, Bld 261 (*)    BUN 21 (*)    ALT 61 (*)    Anion gap 16 (*)    All other components within normal limits  URINALYSIS, COMPLETE (UACMP) WITH MICROSCOPIC - Abnormal; Notable for the following components:   Color, Urine YELLOW (*)    APPearance CLOUDY (*)    Specific Gravity, Urine 1.032 (*)    Glucose, UA >=500 (*)    Hgb urine dipstick SMALL (*)    Ketones, ur 20 (*)    Protein, ur >=300 (*)    Leukocytes,Ua SMALL (*)    RBC / HPF >50 (*)    All other components within normal limits  BASIC METABOLIC PANEL - Abnormal; Notable for the following components:   CO2 20 (*)    Glucose, Bld 338 (*)    BUN 22 (*)    All other components within normal limits  HEMOGLOBIN A1C - Abnormal; Notable for the following components:   Hgb A1c MFr Bld 8.1 (*)    All other components within normal limits  GLUCOSE, CAPILLARY - Abnormal; Notable for the following components:   Glucose-Capillary 309 (*)    All other components within normal limits  GLUCOSE, CAPILLARY - Abnormal; Notable for the following components:   Glucose-Capillary 233 (*)    All other components within normal limits  SARS CORONAVIRUS 2 (TAT 6-24 HRS)  LIPASE, BLOOD  CBC  OCCULT BLOOD X 1 CARD TO LAB, STOOL   ____________________________________________  EKG  None ____________________________________________  RADIOLOGY  None ____________________________________________   PROCEDURES  Procedure(s) performed: No  Procedures   Critical Care performed: No ____________________________________________   INITIAL IMPRESSION / ASSESSMENT AND PLAN / ED COURSE  Pertinent labs & imaging results that were available during my care of the patient  were reviewed by me and considered in my medical decision making (see chart for details).  Patient presents with abdominal distention, nausea vomiting.  Has had gastroparesis flares in the past along with stercoral colitis as well as SBO.  Will obtain labs, give IV morphine, IV Zofran and consider imaging.  Have asked my colleague to follow-up on CT abdomen pelvis    ____________________________________________   FINAL CLINICAL IMPRESSION(S) / ED DIAGNOSES  Abdominal pain     Note:  This document was prepared using Dragon voice recognition software and may include unintentional dictation errors.   Jene Every, MD 12/03/19 [phone]

## 2019-12-02 NOTE — ED Triage Notes (Signed)
Pt arrived via EMS from [facility] due to abdominal pain and constipation. Pts last known BM was 8 days ago. Pt takes Oxycodone every 4 hours as needed for chronic pain. Per staff pt has had suppository today without relief. Abdominal distention present.

## 2019-12-03 ENCOUNTER — Other Ambulatory Visit: Payer: Self-pay

## 2019-12-03 ENCOUNTER — Other Ambulatory Visit: Payer: Self-pay | Admitting: Radiology

## 2019-12-03 ENCOUNTER — Ambulatory Visit: Payer: Medicaid Other | Admitting: Urology

## 2019-12-03 DIAGNOSIS — K5900 Constipation, unspecified: Secondary | ICD-10-CM | POA: Diagnosis not present

## 2019-12-03 DIAGNOSIS — N319 Neuromuscular dysfunction of bladder, unspecified: Secondary | ICD-10-CM | POA: Diagnosis present

## 2019-12-03 DIAGNOSIS — B1920 Unspecified viral hepatitis C without hepatic coma: Secondary | ICD-10-CM | POA: Diagnosis present

## 2019-12-03 DIAGNOSIS — T83010D Breakdown (mechanical) of cystostomy catheter, subsequent encounter: Secondary | ICD-10-CM | POA: Diagnosis not present

## 2019-12-03 DIAGNOSIS — K589 Irritable bowel syndrome without diarrhea: Secondary | ICD-10-CM | POA: Diagnosis present

## 2019-12-03 DIAGNOSIS — Y738 Miscellaneous gastroenterology and urology devices associated with adverse incidents, not elsewhere classified: Secondary | ICD-10-CM | POA: Diagnosis not present

## 2019-12-03 DIAGNOSIS — E669 Obesity, unspecified: Secondary | ICD-10-CM | POA: Diagnosis present

## 2019-12-03 DIAGNOSIS — R112 Nausea with vomiting, unspecified: Secondary | ICD-10-CM | POA: Diagnosis present

## 2019-12-03 DIAGNOSIS — K2100 Gastro-esophageal reflux disease with esophagitis, without bleeding: Secondary | ICD-10-CM | POA: Diagnosis present

## 2019-12-03 DIAGNOSIS — G894 Chronic pain syndrome: Secondary | ICD-10-CM | POA: Diagnosis present

## 2019-12-03 DIAGNOSIS — G629 Polyneuropathy, unspecified: Secondary | ICD-10-CM | POA: Diagnosis not present

## 2019-12-03 DIAGNOSIS — Z435 Encounter for attention to cystostomy: Secondary | ICD-10-CM | POA: Diagnosis present

## 2019-12-03 DIAGNOSIS — E118 Type 2 diabetes mellitus with unspecified complications: Secondary | ICD-10-CM | POA: Diagnosis not present

## 2019-12-03 DIAGNOSIS — K6289 Other specified diseases of anus and rectum: Secondary | ICD-10-CM | POA: Diagnosis present

## 2019-12-03 DIAGNOSIS — I160 Hypertensive urgency: Secondary | ICD-10-CM

## 2019-12-03 DIAGNOSIS — T83090A Other mechanical complication of cystostomy catheter, initial encounter: Secondary | ICD-10-CM | POA: Diagnosis not present

## 2019-12-03 DIAGNOSIS — R7401 Elevation of levels of liver transaminase levels: Secondary | ICD-10-CM

## 2019-12-03 DIAGNOSIS — Q438 Other specified congenital malformations of intestine: Secondary | ICD-10-CM | POA: Diagnosis not present

## 2019-12-03 DIAGNOSIS — E785 Hyperlipidemia, unspecified: Secondary | ICD-10-CM | POA: Diagnosis present

## 2019-12-03 DIAGNOSIS — E1110 Type 2 diabetes mellitus with ketoacidosis without coma: Secondary | ICD-10-CM | POA: Diagnosis present

## 2019-12-03 DIAGNOSIS — K3184 Gastroparesis: Secondary | ICD-10-CM | POA: Diagnosis present

## 2019-12-03 DIAGNOSIS — E876 Hypokalemia: Secondary | ICD-10-CM | POA: Diagnosis not present

## 2019-12-03 DIAGNOSIS — F17210 Nicotine dependence, cigarettes, uncomplicated: Secondary | ICD-10-CM | POA: Diagnosis present

## 2019-12-03 DIAGNOSIS — Z89512 Acquired absence of left leg below knee: Secondary | ICD-10-CM | POA: Diagnosis not present

## 2019-12-03 DIAGNOSIS — K08109 Complete loss of teeth, unspecified cause, unspecified class: Secondary | ICD-10-CM | POA: Diagnosis present

## 2019-12-03 DIAGNOSIS — F329 Major depressive disorder, single episode, unspecified: Secondary | ICD-10-CM | POA: Diagnosis present

## 2019-12-03 DIAGNOSIS — Z20822 Contact with and (suspected) exposure to covid-19: Secondary | ICD-10-CM | POA: Diagnosis present

## 2019-12-03 DIAGNOSIS — E1151 Type 2 diabetes mellitus with diabetic peripheral angiopathy without gangrene: Secondary | ICD-10-CM | POA: Diagnosis present

## 2019-12-03 DIAGNOSIS — T83010A Breakdown (mechanical) of cystostomy catheter, initial encounter: Secondary | ICD-10-CM | POA: Diagnosis not present

## 2019-12-03 DIAGNOSIS — F419 Anxiety disorder, unspecified: Secondary | ICD-10-CM | POA: Diagnosis present

## 2019-12-03 DIAGNOSIS — I10 Essential (primary) hypertension: Secondary | ICD-10-CM | POA: Diagnosis not present

## 2019-12-03 DIAGNOSIS — K5909 Other constipation: Secondary | ICD-10-CM | POA: Diagnosis present

## 2019-12-03 DIAGNOSIS — N368 Other specified disorders of urethra: Secondary | ICD-10-CM | POA: Diagnosis not present

## 2019-12-03 DIAGNOSIS — R52 Pain, unspecified: Secondary | ICD-10-CM | POA: Diagnosis not present

## 2019-12-03 DIAGNOSIS — K5901 Slow transit constipation: Secondary | ICD-10-CM | POA: Diagnosis not present

## 2019-12-03 DIAGNOSIS — E1143 Type 2 diabetes mellitus with diabetic autonomic (poly)neuropathy: Secondary | ICD-10-CM | POA: Diagnosis present

## 2019-12-03 DIAGNOSIS — K5641 Fecal impaction: Secondary | ICD-10-CM | POA: Diagnosis present

## 2019-12-03 LAB — HEMOGLOBIN A1C
Hgb A1c MFr Bld: 8.1 % — ABNORMAL HIGH (ref 4.8–5.6)
Mean Plasma Glucose: 185.77 mg/dL

## 2019-12-03 LAB — MRSA PCR SCREENING: MRSA by PCR: POSITIVE — AB

## 2019-12-03 LAB — CBC
HCT: 39.3 % (ref 39.0–52.0)
Hemoglobin: 13.1 g/dL (ref 13.0–17.0)
MCH: 30.8 pg (ref 26.0–34.0)
MCHC: 33.3 g/dL (ref 30.0–36.0)
MCV: 92.5 fL (ref 80.0–100.0)
Platelets: 216 10*3/uL (ref 150–400)
RBC: 4.25 MIL/uL (ref 4.22–5.81)
RDW: 12.9 % (ref 11.5–15.5)
WBC: 9.2 10*3/uL (ref 4.0–10.5)
nRBC: 0 % (ref 0.0–0.2)

## 2019-12-03 LAB — GLUCOSE, CAPILLARY
Glucose-Capillary: 309 mg/dL — ABNORMAL HIGH (ref 70–99)
Glucose-Capillary: 233 mg/dL — ABNORMAL HIGH (ref 70–99)
Glucose-Capillary: 229 mg/dL — ABNORMAL HIGH (ref 70–99)
Glucose-Capillary: 249 mg/dL — ABNORMAL HIGH (ref 70–99)
Glucose-Capillary: 194 mg/dL — ABNORMAL HIGH (ref 70–99)

## 2019-12-03 LAB — BASIC METABOLIC PANEL
Anion gap: 15 (ref 5–15)
BUN: 22 mg/dL — ABNORMAL HIGH (ref 6–20)
CO2: 20 mmol/L — ABNORMAL LOW (ref 22–32)
Calcium: 9.2 mg/dL (ref 8.9–10.3)
Chloride: 106 mmol/L (ref 98–111)
Creatinine, Ser: 1.21 mg/dL (ref 0.61–1.24)
GFR calc Af Amer: >60 mL/min (ref >60)
GFR calc non Af Amer: >60 mL/min (ref >60)
Glucose, Bld: 338 mg/dL — ABNORMAL HIGH (ref 70–99)
Potassium: 4.5 mmol/L (ref 3.5–5.1)
Sodium: 141 mmol/L (ref 135–145)

## 2019-12-03 LAB — URINALYSIS, COMPLETE (UACMP) WITH MICROSCOPIC
Bacteria, UA: NONE SEEN
Bilirubin Urine: NEGATIVE
Glucose, UA: >=500 mg/dL — AB
Ketones, ur: 20 mg/dL — AB
Nitrite: NEGATIVE
Protein, ur: >=300 mg/dL — AB
RBC / HPF: >50 RBC/hpf — ABNORMAL HIGH (ref 0–5)
Specific Gravity, Urine: 1.032 — ABNORMAL HIGH (ref 1.005–1.030)
pH: 8 (ref 5.0–8.0)

## 2019-12-03 LAB — SARS CORONAVIRUS 2 (TAT 6-24 HRS): SARS Coronavirus 2: NEGATIVE

## 2019-12-03 MED ORDER — ENOXAPARIN SODIUM 40 MG/0.4ML ~~LOC~~ SOLN
40.0000 mg | SUBCUTANEOUS | Status: DC
  Administered 2019-12-03 – 2019-12-14 (×9): 40 mg via SUBCUTANEOUS
  Filled 2019-12-03 (×11): qty 0.4

## 2019-12-03 MED ORDER — FUROSEMIDE 40 MG PO TABS
40.0000 mg | ORAL_TABLET | Freq: Two times a day (BID) | ORAL | Status: DC
  Administered 2019-12-08: 40 mg via ORAL
  Filled 2019-12-03 (×5): qty 1

## 2019-12-03 MED ORDER — INSULIN GLARGINE 100 UNIT/ML ~~LOC~~ SOLN
20.0000 [IU] | Freq: Every day | SUBCUTANEOUS | Status: DC
  Administered 2019-12-03 – 2019-12-13 (×8): 20 [IU] via SUBCUTANEOUS
  Filled 2019-12-03 (×12): qty 0.2

## 2019-12-03 MED ORDER — ONDANSETRON HCL 4 MG PO TABS: 4.0000 mg | ORAL_TABLET | Freq: Four times a day (QID) | ORAL | Status: DC | PRN

## 2019-12-03 MED ORDER — SENNOSIDES-DOCUSATE SODIUM 8.6-50 MG PO TABS
1.0000 | ORAL_TABLET | Freq: Two times a day (BID) | ORAL | Status: DC
  Filled 2019-12-03 (×4): qty 1

## 2019-12-03 MED ORDER — OXYCODONE HCL 5 MG PO TABS
10.0000 mg | ORAL_TABLET | ORAL | Status: DC | PRN
  Administered 2019-12-04 – 2019-12-14 (×13): 10 mg via ORAL
  Filled 2019-12-03 (×14): qty 2

## 2019-12-03 MED ORDER — METHOCARBAMOL 500 MG PO TABS
1000.0000 mg | ORAL_TABLET | Freq: Four times a day (QID) | ORAL | Status: DC
  Administered 2019-12-07 – 2019-12-14 (×17): 1000 mg via ORAL
  Filled 2019-12-03 (×48): qty 2

## 2019-12-03 MED ORDER — INSULIN ASPART 100 UNIT/ML ~~LOC~~ SOLN: 0.0000 [IU] | SUBCUTANEOUS | Status: DC

## 2019-12-03 MED ORDER — TRAZODONE HCL 50 MG PO TABS
50.0000 mg | ORAL_TABLET | Freq: Every day | ORAL | Status: DC
  Administered 2019-12-10 – 2019-12-13 (×4): 50 mg via ORAL
  Filled 2019-12-03 (×6): qty 1

## 2019-12-03 MED ORDER — PREGABALIN 75 MG PO CAPS
75.0000 mg | ORAL_CAPSULE | Freq: Two times a day (BID) | ORAL | Status: DC
  Administered 2019-12-10 – 2019-12-14 (×8): 75 mg via ORAL
  Filled 2019-12-03 (×12): qty 1

## 2019-12-03 MED ORDER — LORATADINE 10 MG PO TABS
10.0000 mg | ORAL_TABLET | Freq: Every day | ORAL | Status: DC
  Administered 2019-12-13 – 2019-12-14 (×2): 10 mg via ORAL
  Filled 2019-12-03 (×5): qty 1

## 2019-12-03 MED ORDER — MAGNESIUM HYDROXIDE 400 MG/5ML PO SUSP
30.0000 mL | Freq: Every day | ORAL | Status: DC | PRN
  Filled 2019-12-03: qty 30

## 2019-12-03 MED ORDER — LABETALOL HCL 5 MG/ML IV SOLN
20.0000 mg | INTRAVENOUS | Status: DC | PRN
  Administered 2019-12-05: 07:00:00 20 mg via INTRAVENOUS
  Filled 2019-12-03 (×2): qty 4

## 2019-12-03 MED ORDER — INSULIN ASPART 100 UNIT/ML ~~LOC~~ SOLN
0.0000 [IU] | SUBCUTANEOUS | Status: DC
  Administered 2019-12-03: 15 [IU] via SUBCUTANEOUS
  Administered 2019-12-03: 7 [IU] via SUBCUTANEOUS
  Administered 2019-12-04 (×2): 4 [IU] via SUBCUTANEOUS
  Administered 2019-12-04: 7 [IU] via SUBCUTANEOUS
  Administered 2019-12-05: 4 [IU] via SUBCUTANEOUS
  Administered 2019-12-07 (×2): 11 [IU] via SUBCUTANEOUS
  Administered 2019-12-08: 4 [IU] via SUBCUTANEOUS
  Administered 2019-12-08: 3 [IU] via SUBCUTANEOUS
  Administered 2019-12-08: 4 [IU] via SUBCUTANEOUS
  Administered 2019-12-09 (×2): 7 [IU] via SUBCUTANEOUS
  Administered 2019-12-09: 22:00:00 4 [IU] via SUBCUTANEOUS
  Administered 2019-12-09: 11 [IU] via SUBCUTANEOUS
  Administered 2019-12-10: 09:00:00 3 [IU] via SUBCUTANEOUS
  Administered 2019-12-10: 7 [IU] via SUBCUTANEOUS
  Administered 2019-12-10: 12:00:00 3 [IU] via SUBCUTANEOUS
  Administered 2019-12-10 – 2019-12-11 (×5): 4 [IU] via SUBCUTANEOUS
  Administered 2019-12-11: 02:00:00 3 [IU] via SUBCUTANEOUS
  Administered 2019-12-11 (×2): 7 [IU] via SUBCUTANEOUS
  Administered 2019-12-12 (×3): 4 [IU] via SUBCUTANEOUS
  Administered 2019-12-12: 7 [IU] via SUBCUTANEOUS
  Administered 2019-12-12 – 2019-12-13 (×3): 4 [IU] via SUBCUTANEOUS
  Administered 2019-12-13: 10:00:00 3 [IU] via SUBCUTANEOUS
  Administered 2019-12-13: 4 [IU] via SUBCUTANEOUS
  Administered 2019-12-13: 7 [IU] via SUBCUTANEOUS
  Administered 2019-12-14 (×2): 3 [IU] via SUBCUTANEOUS
  Administered 2019-12-14: 7 [IU] via SUBCUTANEOUS
  Filled 2019-12-03 (×42): qty 1

## 2019-12-03 MED ORDER — SODIUM CHLORIDE 0.9 % IV SOLN: INTRAVENOUS | Status: DC

## 2019-12-03 MED ORDER — BISACODYL 10 MG RE SUPP
10.0000 mg | Freq: Every day | RECTAL | Status: DC | PRN
  Filled 2019-12-03: qty 1

## 2019-12-03 MED ORDER — MORPHINE SULFATE (PF) 2 MG/ML IV SOLN
2.0000 mg | INTRAVENOUS | Status: DC | PRN
  Administered 2019-12-04 – 2019-12-13 (×38): 2 mg via INTRAVENOUS
  Filled 2019-12-03 (×38): qty 1

## 2019-12-03 MED ORDER — FLEET ENEMA 7-19 GM/118ML RE ENEM
1.0000 | ENEMA | RECTAL | Status: DC | PRN
  Administered 2019-12-10: 16:00:00 1 via RECTAL

## 2019-12-03 MED ORDER — SCOPOLAMINE 1 MG/3DAYS TD PT72
1.0000 | MEDICATED_PATCH | TRANSDERMAL | Status: DC
  Administered 2019-12-03 – 2019-12-12 (×4): 1.5 mg via TRANSDERMAL
  Filled 2019-12-03 (×4): qty 1

## 2019-12-03 MED ORDER — ONDANSETRON HCL 4 MG/2ML IJ SOLN
4.0000 mg | Freq: Four times a day (QID) | INTRAMUSCULAR | Status: DC | PRN
  Administered 2019-12-03 – 2019-12-12 (×25): 4 mg via INTRAVENOUS
  Filled 2019-12-03 (×25): qty 2

## 2019-12-03 MED ORDER — INSULIN ASPART 100 UNIT/ML ~~LOC~~ SOLN: 0.0000 [IU] | SUBCUTANEOUS | Status: AC

## 2019-12-03 MED ORDER — MELATONIN 5 MG PO TABS
5.0000 mg | ORAL_TABLET | Freq: Every day | ORAL | Status: DC
  Administered 2019-12-11 – 2019-12-13 (×3): 5 mg via ORAL
  Filled 2019-12-03 (×12): qty 1

## 2019-12-03 MED ORDER — LINACLOTIDE 290 MCG PO CAPS
290.0000 ug | ORAL_CAPSULE | Freq: Every day | ORAL | Status: DC
  Administered 2019-12-10 – 2019-12-14 (×2): 290 ug via ORAL
  Filled 2019-12-03 (×13): qty 1

## 2019-12-03 MED ORDER — METOPROLOL SUCCINATE ER 25 MG PO TB24
25.0000 mg | ORAL_TABLET | Freq: Every day | ORAL | Status: DC
  Administered 2019-12-14: 10:00:00 25 mg via ORAL
  Filled 2019-12-03 (×4): qty 1

## 2019-12-03 MED ORDER — OXYCODONE HCL ER 10 MG PO T12A
10.0000 mg | EXTENDED_RELEASE_TABLET | Freq: Two times a day (BID) | ORAL | Status: DC
  Administered 2019-12-10 – 2019-12-14 (×8): 10 mg via ORAL
  Filled 2019-12-03 (×12): qty 1

## 2019-12-03 MED ORDER — PANTOPRAZOLE SODIUM 40 MG IV SOLR
40.0000 mg | Freq: Two times a day (BID) | INTRAVENOUS | Status: DC
  Administered 2019-12-03 – 2019-12-14 (×21): 40 mg via INTRAVENOUS
  Filled 2019-12-03 (×20): qty 40

## 2019-12-03 MED ORDER — LINACLOTIDE 145 MCG PO CAPS: 145.0000 ug | ORAL_CAPSULE | Freq: Every day | ORAL | Status: DC

## 2019-12-03 MED ORDER — LACTULOSE 10 GM/15ML PO SOLN
20.0000 g | Freq: Once | ORAL | Status: AC
  Administered 2019-12-03: 20 g via ORAL
  Filled 2019-12-03: qty 30

## 2019-12-03 MED ORDER — POLYETHYLENE GLYCOL 3350 17 G PO PACK
17.0000 g | PACK | Freq: Two times a day (BID) | ORAL | Status: DC
  Administered 2019-12-10 – 2019-12-14 (×7): 17 g via ORAL
  Filled 2019-12-03 (×12): qty 1

## 2019-12-03 MED ORDER — FERROUS GLUCONATE 324 (38 FE) MG PO TABS
324.0000 mg | ORAL_TABLET | Freq: Two times a day (BID) | ORAL | Status: DC
  Filled 2019-12-03 (×14): qty 1

## 2019-12-03 MED ORDER — ACETAMINOPHEN 650 MG RE SUPP: 650.0000 mg | Freq: Four times a day (QID) | RECTAL | Status: DC | PRN

## 2019-12-03 MED ORDER — PROMETHAZINE HCL 25 MG/ML IJ SOLN
12.5000 mg | Freq: Once | INTRAMUSCULAR | Status: AC
  Administered 2019-12-03: 02:00:00 12.5 mg via INTRAVENOUS

## 2019-12-03 MED ORDER — METOCLOPRAMIDE HCL 5 MG/ML IJ SOLN
10.0000 mg | Freq: Four times a day (QID) | INTRAMUSCULAR | Status: DC
  Administered 2019-12-03 – 2019-12-13 (×39): 10 mg via INTRAVENOUS
  Filled 2019-12-03 (×38): qty 2

## 2019-12-03 MED ORDER — SORBITOL 70 % SOLN
960.0000 mL | TOPICAL_OIL | Freq: Once | ORAL | Status: AC
  Administered 2019-12-03: 960 mL via RECTAL
  Filled 2019-12-03: qty 473

## 2019-12-03 MED ORDER — HYDRALAZINE HCL 50 MG PO TABS
50.0000 mg | ORAL_TABLET | Freq: Three times a day (TID) | ORAL | Status: DC
  Administered 2019-12-08 – 2019-12-14 (×8): 50 mg via ORAL
  Filled 2019-12-03 (×14): qty 1

## 2019-12-03 MED ORDER — AMLODIPINE BESYLATE 10 MG PO TABS
10.0000 mg | ORAL_TABLET | Freq: Every day | ORAL | Status: DC
  Administered 2019-12-14: 10:00:00 10 mg via ORAL
  Filled 2019-12-03 (×4): qty 1

## 2019-12-03 MED ORDER — ONDANSETRON HCL 4 MG PO TABS
4.0000 mg | ORAL_TABLET | Freq: Four times a day (QID) | ORAL | Status: DC | PRN
  Filled 2019-12-03: qty 1

## 2019-12-03 MED ORDER — SODIUM CHLORIDE 0.9 % IV BOLUS
1000.0000 mL | Freq: Once | INTRAVENOUS | Status: AC
  Administered 2019-12-03: 1000 mL via INTRAVENOUS

## 2019-12-03 MED ORDER — NICOTINE 21 MG/24HR TD PT24
21.0000 mg | MEDICATED_PATCH | Freq: Every day | TRANSDERMAL | Status: DC
  Administered 2019-12-03 – 2019-12-14 (×12): 21 mg via TRANSDERMAL
  Filled 2019-12-03 (×12): qty 1

## 2019-12-03 MED ORDER — FAMOTIDINE 20 MG PO TABS: 20.0000 mg | ORAL_TABLET | Freq: Every day | ORAL | Status: DC

## 2019-12-03 MED ORDER — ACETAMINOPHEN 325 MG PO TABS: 650.0000 mg | ORAL_TABLET | Freq: Four times a day (QID) | ORAL | Status: DC | PRN

## 2019-12-03 MED ORDER — TRAZODONE HCL 50 MG PO TABS: 25.0000 mg | ORAL_TABLET | Freq: Every evening | ORAL | Status: DC | PRN

## 2019-12-03 NOTE — ED Notes (Signed)
Pt cleansed of stool incontinence by this RN and Sam, Charity fundraiser. Clean sheets and blankets given to patient at this time. Pt tolerated well. Pt continues to c/o nausea at this time, explained admitting MD wrote orders for patient to have scop patch. Pt states understanding. Admitting MD aware of plan to hold all PO meds until patient able to tolerate PO.

## 2019-12-03 NOTE — ED Provider Notes (Signed)
I assumed care of the patient from Dr. Peter Minium at 11:00 PM.  CT scan revealed progressed fecal impaction with proctitis.  As such patient required manual disimpaction which I performed.  Large amount of stool was extracted.  Patient subsequently given an enema and had a bowel movement.  In addition patient was given Linzess.  Patient however continues to have nausea with retching despite receiving multiple IV antiemetic agents.  As such patient discussed with Dr. Arville Care hospitalist for admission for further evaluation and management of intractable nausea and vomiting    Darci Current, MD 12/03/19 [phone]

## 2019-12-03 NOTE — H&P (Addendum)
[facility] [place] NAME: Eugene [name]    MR#:  [id number]  DATE OF BIRTH:  Oct 30, 1960  DATE OF ADMISSION:  12/02/2019  PRIMARY CARE PHYSICIAN: Marco Collie, MD   REQUESTING/REFERRING PHYSICIAN: Marjean Donna, MD  CHIEF COMPLAINT:   Chief Complaint  Patient presents with  . Abdominal Pain  Intractable nausea and vomiting  HISTORY OF PRESENT ILLNESS:  Eugene [name]  is a 59 y.o. Caucasian male who is a resident of [facility], with a known history of type II diabetes mellitus, dyslipidemia, anxiety and depression as well as hepatitis C and irritable bowel syndrome, who presented to the emergency room with acute onset of intractable nausea and vomiting with associated epigastric and periumbilical abdominal pain since 2 PM yesterday.  He admitted to constipation.  He has been having chills without tactile fever.  He denied any bilious vomitus or hematemesis or diarrhea.  No chest pain, dyspnea or cough or wheezing.  No exposure to COVID-19.  He has a suprapubic catheter and has not noticed any change in urine color or smell.  Upon presentation to the emergency room, blood pressure was 199/95 with respirate of 21 and otherwise normal vital signs.  Labs revealed blood glucose of 261 and a BUN of 21 with creatinine 1.07 with anion gap of 16 and CO2 of 19.  CBC showed no leukocytosis of 11.6.  COVID-19 PCR is currently pending.  Abdominal pelvic CT scan revealed progressed to distal fecal impaction since January with associated acute proctitis/stercoral colitis as well as redundant large bowel and substantial retained stool also on the left colon with no dilated small bowel.  It showed a stable suprapubic catheter and decompressed urinary bladder.  The patient was given hydration with IV normal saline, 4 mg of IV morphine sulfate, 10 mg of IV Reglan, 12.5 mg of IV Phenergan, 290 mcg of p.o. Linzess, 20 g of p.o. lactulose and smog  enema in addition to disimpaction with significant amount of stools.  He continued to have nausea and vomiting however.  Will therefore be admitted to an observation medical bed for further evaluation and management. PAST MEDICAL HISTORY:   Past Medical History:  Diagnosis Date  . Anxiety   . Depression   . Diabetes mellitus without complication ([place])   . Hepatitis C   . Hyperlipidemia   . IBS (irritable bowel syndrome)   . Osteomyelitis ([place])   . Spinal stenosis     PAST SURGICAL HISTORY:   Past Surgical History:  Procedure Laterality Date  . bilateral amputation Bilateral   . CENTRAL LINE INSERTION-TUNNELED N/A 02/02/2019   Procedure: CENTRAL LINE INSERTION-TUNNELED;  Surgeon: Algernon Huxley, MD;  Location: [facility];  Service: Cardiovascular;  Laterality: N/A;  . SPINAL FUSION    . THORACIC SPINE SURGERY  01/2019   extensive washout    SOCIAL HISTORY:   Social History   Tobacco Use  . Smoking status: Current Every Day Smoker    Packs/day: 0.25    Types: Cigarettes  . Smokeless tobacco: Never Used  Substance Use Topics  . Alcohol use: Not Currently    FAMILY HISTORY:  Positive for diabetes mellitus and hypertension.  DRUG ALLERGIES:  No Known Allergies  REVIEW OF SYSTEMS:   ROS As per history of present illness. All pertinent systems were reviewed above. Constitutional,  HEENT, cardiovascular, respiratory, GI, GU, musculoskeletal, neuro, psychiatric, endocrine,  integumentary and hematologic systems were reviewed and are otherwise  negative/unremarkable except for positive findings mentioned above in the HPI.   MEDICATIONS AT HOME:   Prior to Admission medications   Medication Sig Start Date End Date Taking? Authorizing Provider  acetaminophen (TYLENOL) 325 MG tablet Take 650 mg by mouth every 4 (four) hours as needed for mild pain or fever.   Yes [provider]  amLODipine (NORVASC) 10 MG tablet Take 10 mg by mouth daily.   Yes  [provider]  bisacodyl (DULCOLAX) 10 MG suppository Place 10 mg rectally daily as needed for moderate constipation.   Yes [provider]  famotidine (PEPCID) 20 MG tablet Take 20 mg by mouth daily.    Yes [provider]  ferrous gluconate (FERGON) 324 MG tablet Take 324 mg by mouth 2 (two) times daily with a meal.    Yes [provider]  furosemide (LASIX) 40 MG tablet Take 40 mg by mouth 2 (two) times daily.   Yes [provider]  hydrALAZINE (APRESOLINE) 50 MG tablet Take 50 mg by mouth 3 (three) times daily.   Yes [provider]  insulin glargine (LANTUS) 100 UNIT/ML injection Inject 20 Units into the skin at bedtime.    Yes [provider]  insulin lispro (HUMALOG) 100 UNIT/ML injection Inject 0-15 Units into the skin 4 (four) times daily -  before meals and at bedtime. <150= 0 units 151-200= 3 units 201-250= 6 units 251-300= 9 units 301-350= 12 units 351-400= 15 units >400 call MD   Yes [provider]  linaclotide (LINZESS) 145 MCG CAPS capsule Take 1 capsule (145 mcg total) by mouth daily before breakfast. 10/25/19  Yes Danford, Earl Lites, MD  loratadine (CLARITIN) 10 MG tablet Take 10 mg by mouth daily.   Yes [provider]  Melatonin 5 MG TABS Take 5 mg by mouth at bedtime.   Yes [provider]  methocarbamol (ROBAXIN) 500 MG tablet Take 1,000 mg by mouth 4 (four) times daily.    Yes [provider]  metoCLOPramide (REGLAN) 10 MG tablet Take 1 tablet (10 mg total) by mouth every 8 (eight) hours as needed for nausea, vomiting or refractory nausea / vomiting. 10/29/19 10/28/20 Yes Danford, Earl Lites, MD  metoprolol succinate (TOPROL-XL) 25 MG 24 hr tablet Take 25 mg by mouth daily.   Yes [provider]  nicotine (NICODERM CQ - DOSED IN MG/24 HOURS) 21 mg/24hr patch Place 21 mg onto the skin daily.   Yes [provider]  ondansetron (ZOFRAN) 4 MG tablet Take 4  mg by mouth every 6 (six) hours as needed for nausea or vomiting.   Yes [provider]  oxyCODONE ER 9 MG C12A Take 9 mg by mouth 2 (two) times daily. 10/24/19  Yes Danford, Earl Lites, MD  Oxycodone HCl 10 MG TABS Take 1 tablet (10 mg total) by mouth every 4 (four) hours as needed. Patient taking differently: Take 10 mg by mouth every 4 (four) hours as needed (pain).  10/24/19  Yes Danford, Earl Lites, MD  polyethylene glycol (MIRALAX / GLYCOLAX) 17 g packet Take 17 g by mouth 2 (two) times daily. 10/24/19  Yes Danford, Earl Lites, MD  pregabalin (LYRICA) 75 MG capsule Take 75 mg by mouth 2 (two) times daily.   Yes [provider]  senna-docusate (SENOKOT-S) 8.6-50 MG tablet Take 1 tablet by mouth 2 (two) times daily. 10/24/19  Yes Danford, Earl Lites, MD  sodium phosphate Pediatric (FLEET) 3.5-9.5 GM/59ML enema Place 1 enema rectally every other  day as needed for severe constipation.   Yes [provider]  traZODone (DESYREL) 50 MG tablet Take 50 mg by mouth at bedtime.    Yes [provider]      VITAL SIGNS:  Blood pressure (!) 199/95, pulse 81, temperature 98.7 F (37.1 C), temperature source Oral, resp. rate (!) 21, SpO2 100 %.  PHYSICAL EXAMINATION:  Physical Exam  GENERAL:  59 y.o.-year-old patient lying in the bed with no acute distress.  EYES: Pupils equal, round, reactive to light and accommodation. No scleral icterus. Extraocular muscles intact.  HEENT: Head atraumatic, normocephalic. Oropharynx and nasopharynx clear.  NECK:  Supple, no jugular venous distention. No thyroid enlargement, no tenderness.  LUNGS: Normal breath sounds bilaterally, no wheezing, rales,rhonchi or crepitation. No use of accessory muscles of respiration.  CARDIOVASCULAR: Regular rate and rhythm, S1, S2 normal. No murmurs, rubs, or gallops.  ABDOMEN: Soft, with mild epigastric and periumbilical tenderness without rebound tenderness guarding or rigidity.  Bowel  sounds present. No organomegaly or mass.  He ha had a large amount of dark stools in his diapers.   EXTREMITIES: No pedal edema, cyanosis, or clubbing.  NEUROLOGIC: Cranial nerves II through XII are intact. Muscle strength 5/5 in all extremities. Sensation intact. Gait not checked.  PSYCHIATRIC: The patient is alert and oriented x 3.  Normal affect and good eye contact. SKIN: No obvious rash, lesion, or ulcer.   LABORATORY PANEL:   CBC Recent Labs  Lab 12/02/19 2131  WBC 11.6*  HGB 13.9  HCT 41.1  PLT 210   ------------------------------------------------------------------------------------------------------------------  Chemistries  Recent Labs  Lab 12/02/19 2131  NA 138  K 4.1  CL 103  CO2 19*  GLUCOSE 261*  BUN 21*  CREATININE 1.07  CALCIUM 9.3  AST 29  ALT 61*  ALKPHOS 102  BILITOT 1.2   ------------------------------------------------------------------------------------------------------------------  Cardiac Enzymes No results for input(s): TROPONINI in the last 168 hours. ------------------------------------------------------------------------------------------------------------------  RADIOLOGY:  CT ABDOMEN PELVIS W CONTRAST  Result Date: 12/03/2019 CLINICAL DATA:  59 year old male with abdominal pain, no bowel movement in 8 days. EXAM: CT ABDOMEN AND PELVIS WITH CONTRAST TECHNIQUE: Multidetector CT imaging of the abdomen and pelvis was performed using the standard protocol following bolus administration of intravenous contrast. CONTRAST:  OMNIPAQUE IOHEXOL 300 MG/ML  SOLN COMPARISON:  CT Abdomen and Pelvis 10/21/2019. FINDINGS: Lower chest: Similar appearance of circumferential distal esophageal wall thickening. No lower posterior mediastinal lymphadenopathy. No pericardial or pleural effusion. Mild right lower lobe lung scarring. Hepatobiliary: Gallbladder and liver within normal limits. No bile duct enlargement. Pancreas: Atrophied but otherwise negative.  Spleen: Negative. Adrenals/Urinary Tract: Normal adrenal glands. Symmetric renal enhancement and contrast excretion. No nephrolithiasis identified. No hydronephrosis. There is a chronic suprapubic catheter and the urinary bladder is decompressed today. Stomach/Bowel: Persistent distal fecal impaction, not improved since January and with mildly increased distal sigmoid retained stool. There is distal sigmoid and rectal wall thickening and perirectal inflammatory stranding. Redundant sigmoid colon which loops in the right lower quadrant. The mid sigmoid is decompressed and without stool. Mild diverticulosis is noted. Retained stool continues however at the junction of the sigmoid and descending colon and throughout redundant left colon. Redundant transverse colon although more containing gas. The cecum is on a lax mesentery in the mid abdomen and distended with gas. No dilated small bowel. Decompressed stomach. Small fat containing umbilical hernia is stable. No free air. No free fluid. Vascular/Lymphatic: Mild aortoiliac calcified atherosclerosis. The major arterial structures appear patent. Portal venous system  is patent. Reproductive: Negative. Other: No pelvic free fluid, but persistent and increased perirectal and presacral inflammatory stranding (series 2, image 76). Musculoskeletal: Lower thoracic spinal rods and previous T8-T9 corpectomy and posterior decompression. Chronic L1 compression fracture. No acute osseous abnormality identified. IMPRESSION: 1. Progressed distal Fecal Impaction since January with associated Acute Proctitis / Stercoral Colitis. 2. Redundant large bowel, and substantial retained stool also in the left colon. But no dilated small bowel. 3. Stable suprapubic catheter, decompressed urinary bladder. Electronically Signed   By: Odessa Fleming M.D.   On: 12/03/2019 00:07      IMPRESSION AND PLAN:   1.  Intractable nausea and vomiting.  -This could be related to diabetic gastroparesis.  The  patient is status post disimpaction for constipation. -We will place the patient in an observation medical bed. -He will be placed on scheduled IV Reglan. -Pain management will be provided. -She will be hydrated with IV normal saline. -We will place him on clear liquid diet and advance as tolerated. -Fleet enema will be provided as needed. -We will check stool Hemoccult. -We will place the patient on IV PPI therapy for the possibility of acute gastritis.  2.  Uncontrolled type II diabetes mellitus with mild DKA. -The patient will be placed on frequent fingerstick blood glucose measures and supplemental subcutaneous NovoLog in addition to hydration with IV normal saline. -Will check hemoglobin A1c. -Will monitor BMPs.  3.  Hypertensive urgency. -This is like secondary to his nausea and vomiting. -We will continue his antihypertensives. -We will place the patient on as needed IV labetalol.  4.  Peripheral neuropathy. -Lyrica will be resumed.  5.  DVT prophylaxis. -Subcutaneous Lovenox.    All the records are reviewed and case discussed with ED provider. The plan of care was discussed in details with the patient (and family). I answered all questions. The patient agreed to proceed with the above mentioned plan. Further management will depend upon hospital course.   CODE STATUS: Full code  TOTAL TIME TAKING CARE OF THIS PATIENT: 55 minutes.    Hannah Beat M.D on 12/03/2019 at 3:20 AM  [facility]   From 7 PM-7 AM, contact night-coverage [email or web address]  CC: Primary care physician; Abner Greenspan, MD   Note: This dictation was prepared with Dragon dictation along with smaller phrase technology. Any transcriptional errors that result from this process are unintentional.

## 2019-12-03 NOTE — ED Notes (Signed)
Pt has taken IV out for the second time. IV team had to place last with [place]. Another consult placed.

## 2019-12-03 NOTE — ED Notes (Signed)
Pt coached to lay on side to help ease stool out of anus.

## 2019-12-03 NOTE — ED Notes (Signed)
This RN to bedside, Scopalamine patch applied behind L ear at this time. Pt given drink of water per his request as PO challenge. Will monitor for further N/V, pt states "It's been a while since I threw up", pt also noted to continue to burp and occasionally dry heave while this RN in the room.

## 2019-12-03 NOTE — TOC Initial Note (Signed)
Transition of Care [facility]) - Initial/Assessment Note    Patient Details  Name: Eugene [name] MRN: [id number] Date of Birth: [birth year]  [facility] CM/SW Contact:    Allayne Butcher, RN Phone Number: 12/03/2019, 3:51 PM  Clinical Narrative:                 Patient admitted for abdominal pain and intractable nausea and vomiting.  Patient is from [facility] long term [facility].  Patient reports that he has been at [facility] for the past 43months.  Patient is alert and oriented, he asks for ice chips several times during the conversation.   Plan for discharge back to [facility] when medically ready, patient is wheelchair bound at baseline, he is able to assist with his ADL's.   Expected Discharge Plan: Skilled Nursing Facility Barriers to Discharge: Continued Medical Work up   Patient Goals and CMS Choice Patient states their goals for this hospitalization and ongoing recovery are:: Get better, get out of the hospital      Expected Discharge Plan and Services Expected Discharge Plan: Skilled Nursing Facility   Discharge Planning Services: CM Consult Post Acute Care Choice: Skilled Nursing Facility Living arrangements for the past 2 months: Skilled Nursing Facility                                      Prior Living Arrangements/Services Living arrangements for the past 2 months: Skilled Nursing Facility Lives with:: Facility Resident Patient language and need for interpreter reviewed:: Yes Do you feel safe going back to the place where you live?: Yes      Need for Family Participation in Patient Care: No (Comment)     Criminal Activity/Legal Involvement Pertinent to Current Situation/Hospitalization: No - Comment as needed  Activities of Daily Living Home Assistive Devices/Equipment: Wheelchair ADL Screening (condition at time of admission) Patient's cognitive ability adequate to safely complete daily activities?: No Is the patient  deaf or have difficulty hearing?: No Does the patient have difficulty seeing, even when wearing glasses/contacts?: No Does the patient have difficulty concentrating, remembering, or making decisions?: No Patient able to express need for assistance with ADLs?: Yes Does the patient have difficulty dressing or bathing?: Yes Independently performs ADLs?: No Communication: Independent Dressing (OT): Needs assistance Is this a change from baseline?: Pre-admission baseline Grooming: Needs assistance Is this a change from baseline?: Pre-admission baseline Feeding: Independent Bathing: Needs assistance Is this a change from baseline?: Pre-admission baseline Toileting: Needs assistance Is this a change from baseline?: Pre-admission baseline In/Out Bed: Dependent Is this a change from baseline?: Pre-admission baseline Walks in Home: Dependent Is this a change from baseline?: Pre-admission baseline Does the patient have difficulty walking or climbing stairs?: Yes Weakness of Legs: None Weakness of Arms/Hands: None  Permission Sought/Granted Permission sought to share information with : Facility Medical sales representative, Case Estate manager/land agent granted to share information with : Yes, Verbal Permission Granted     Permission granted to share info w AGENCY: [facility]        Emotional Assessment Appearance:: Appears stated age Attitude/Demeanor/Rapport: Avoidant Affect (typically observed): Accepting Orientation: : Oriented to Self, Oriented to Place, Oriented to  Time, Oriented to Situation Alcohol / Substance Use: Not Applicable Psych Involvement: No (comment)  Admission diagnosis:  Intractable nausea and vomiting [R11.2] Patient Active Problem List   Diagnosis Date Noted  . Intractable nausea and vomiting 12/03/2019  .  Nausea and vomiting 10/21/2019  . Suprapubic catheter ([place])   . Sepsis ([place]) 10/02/2019  . MRSA bacteremia 02/08/2019  . Osteomyelitis of thoracic spine  ([place]) 02/08/2019  . Peripheral vascular disease ([place]) 02/08/2019  . Hypertension 02/08/2019  . Dyslipidemia 02/08/2019  . Chronic pain syndrome 02/08/2019  . Polysubstance abuse ([place]) 02/08/2019  . Chronic hepatitis C without hepatic coma ([place]) 02/08/2019  . GERD (gastroesophageal reflux disease) 02/08/2019  . Cigarette smoker 02/08/2019  . Normocytic anemia 02/08/2019  . UTI (urinary tract infection) 02/07/2019  . AKI (acute kidney injury) ([place]) 02/07/2019  . Hyperkalemia 02/07/2019  . Diabetes ([place]) 02/07/2019  . Pressure injury of skin 02/07/2019  . Hyperlipidemia 11/07/2018  . MRSA (methicillin resistant Staphylococcus aureus) septicemia ([place]) 08/17/2017  . Other acute osteomyelitis, left ankle and foot ([place]) 06/29/2017  . Diabetic ulcer of right foot associated with diabetes mellitus due to underlying condition ([place]) 06/27/2017   PCP:  Marco Collie, MD Pharmacy:  No Pharmacies Listed    Social Determinants of Health (SDOH) Interventions    Readmission Risk Interventions Readmission Risk Prevention Plan 10/05/2019 03/04/2019  Transportation Screening Complete Complete  Medication Review Press photographer) Complete Complete  PCP or Specialist appointment within 3-5 days of discharge Complete -  [place] or Home Care Consult Complete -  SW Recovery Care/Counseling Consult Complete -  Palliative Care Screening Not Applicable Not Applicable  Skilled Nursing Facility Complete Complete

## 2019-12-03 NOTE — ED Notes (Signed)
This RN to bedside, meds administered per MD order, pt noted to continue to vomit, PO meds held due to patient being unable to tolerate PO at this time, admitting MD made aware that patient unable to tolerate PO and continues to vomit at this time. Awaiting orders from admitting MD.

## 2019-12-03 NOTE — Progress Notes (Addendum)
PROGRESS NOTE    Eugene [name]  HQI:[id number] DOB: 06-18-1961 DOA: 12/02/2019 PCP: Marco Collie, MD      Assessment & Plan:   Active Problems:   Intractable nausea and vomiting   Intractable nausea and vomiting: etiology unclear, possibly secondary to diabetic gastroparesis. Still w/ multiple episodes of vomiting. S/p disimpaction for constipation. Continue on scheduled IV Reglan. Start scopolamine patch. Continue on IVFs. Continue on PPI   DM2: uncontrolled with mild DKA. Continue on SSI w/ accuchecks. HbA1c pending  Hypertensive urgency: like secondary to his nausea and vomiting. Continue on amlodipine, lasix & metoprolol. IV labetalol prn.  Peripheral neuropathy: continue on home dose of pregabalin   Obesity: BMI: 32.5. Would benefit from weight loss.  Transaminitis: AST is WNL, ALT is elevated. Etiology unclear. Will continue to monitor    DVT prophylaxis: lovenox Code Status: full  Family Communication: Disposition Plan:  When pt is no longer vomiting and tolerating a po diet; will return to home facility likely   Consultants:    Procedures:    Antimicrobials: n/a   Subjective: Pt c/o nausea & vomiting  Objective: Vitals:   12/03/19 0400 12/03/19 0500 12/03/19 0653 12/03/19 0654  BP: (!) 168/94  (!) 165/77   Pulse: 90   81  Resp: 18     Temp:      TempSrc:      SpO2: 99%   99%  Weight:  108.9 kg     No intake or output data in the 24 hours ending 12/03/19 0819 Filed Weights   12/03/19 0500  Weight: 108.9 kg    Examination:  General exam: Appears calm and comfortable  Respiratory system: Clear to auscultation. Respiratory effort normal. Cardiovascular system: S1 & S2 +. No rubs, gallops or clicks. Gastrointestinal system: Abdomen is obese, soft and nontender. Hypoactive bowel sounds heard. Central nervous system: Alert and oriented.  Psychiatry: Judgement and insight appear normal. Flat mood and affect    Data Reviewed: I have  personally reviewed following labs and imaging studies  CBC: Recent Labs  Lab 12/02/19 2131 12/03/19 0719  WBC 11.6* 9.2  HGB 13.9 13.1  HCT 41.1 39.3  MCV 91.3 92.5  PLT 210 132   Basic Metabolic Panel: Recent Labs  Lab 12/02/19 2131  NA 138  K 4.1  CL 103  CO2 19*  GLUCOSE 261*  BUN 21*  CREATININE 1.07  CALCIUM 9.3   GFR: Estimated Creatinine Clearance: 98.2 mL/min (by C-G formula based on SCr of 1.07 mg/dL). Liver Function Tests: Recent Labs  Lab 12/02/19 2131  AST 29  ALT 61*  ALKPHOS 102  BILITOT 1.2  PROT 8.1  ALBUMIN 4.1   Recent Labs  Lab 12/02/19 2131  LIPASE 14   No results for input(s): AMMONIA in the last 168 hours. Coagulation Profile: No results for input(s): INR, PROTIME in the last 168 hours. Cardiac Enzymes: No results for input(s): CKTOTAL, CKMB, CKMBINDEX, TROPONINI in the last 168 hours. BNP (last 3 results) No results for input(s): PROBNP in the last 8760 hours. HbA1C: No results for input(s): HGBA1C in the last 72 hours. CBG: Recent Labs  Lab 12/03/19 0753  GLUCAP 309*   Lipid Profile: No results for input(s): CHOL, HDL, LDLCALC, TRIG, CHOLHDL, LDLDIRECT in the last 72 hours. Thyroid Function Tests: No results for input(s): TSH, T4TOTAL, FREET4, T3FREE, THYROIDAB in the last 72 hours. Anemia Panel: No results for input(s): VITAMINB12, FOLATE, FERRITIN, TIBC, IRON, RETICCTPCT in the last 72 hours. Sepsis Labs: No results for  input(s): PROCALCITON, LATICACIDVEN in the last 168 hours.  No results found for this or any previous visit (from the past 240 hour(s)).       Radiology Studies: CT ABDOMEN PELVIS W CONTRAST  Result Date: 12/03/2019 CLINICAL DATA:  59 year old male with abdominal pain, no bowel movement in 8 days. EXAM: CT ABDOMEN AND PELVIS WITH CONTRAST TECHNIQUE: Multidetector CT imaging of the abdomen and pelvis was performed using the standard protocol following bolus administration of intravenous contrast.  CONTRAST:  OMNIPAQUE IOHEXOL 300 MG/ML  SOLN COMPARISON:  CT Abdomen and Pelvis 10/21/2019. FINDINGS: Lower chest: Similar appearance of circumferential distal esophageal wall thickening. No lower posterior mediastinal lymphadenopathy. No pericardial or pleural effusion. Mild right lower lobe lung scarring. Hepatobiliary: Gallbladder and liver within normal limits. No bile duct enlargement. Pancreas: Atrophied but otherwise negative. Spleen: Negative. Adrenals/Urinary Tract: Normal adrenal glands. Symmetric renal enhancement and contrast excretion. No nephrolithiasis identified. No hydronephrosis. There is a chronic suprapubic catheter and the urinary bladder is decompressed today. Stomach/Bowel: Persistent distal fecal impaction, not improved since January and with mildly increased distal sigmoid retained stool. There is distal sigmoid and rectal wall thickening and perirectal inflammatory stranding. Redundant sigmoid colon which loops in the right lower quadrant. The mid sigmoid is decompressed and without stool. Mild diverticulosis is noted. Retained stool continues however at the junction of the sigmoid and descending colon and throughout redundant left colon. Redundant transverse colon although more containing gas. The cecum is on a lax mesentery in the mid abdomen and distended with gas. No dilated small bowel. Decompressed stomach. Small fat containing umbilical hernia is stable. No free air. No free fluid. Vascular/Lymphatic: Mild aortoiliac calcified atherosclerosis. The major arterial structures appear patent. Portal venous system is patent. Reproductive: Negative. Other: No pelvic free fluid, but persistent and increased perirectal and presacral inflammatory stranding (series 2, image 76). Musculoskeletal: Lower thoracic spinal rods and previous T8-T9 corpectomy and posterior decompression. Chronic L1 compression fracture. No acute osseous abnormality identified. IMPRESSION: 1. Progressed distal  Fecal Impaction since January with associated Acute Proctitis / Stercoral Colitis. 2. Redundant large bowel, and substantial retained stool also in the left colon. But no dilated small bowel. 3. Stable suprapubic catheter, decompressed urinary bladder. Electronically Signed   By: Odessa Fleming M.D.   On: 12/03/2019 00:07        Scheduled Meds: . amLODipine  10 mg Oral Daily  . enoxaparin (LOVENOX) injection  40 mg Subcutaneous Q24H  . famotidine  20 mg Oral Daily  . ferrous gluconate  324 mg Oral BID WC  . furosemide  40 mg Oral BID  . hydrALAZINE  50 mg Oral TID  . insulin aspart  0-20 Units Subcutaneous Q2H  . insulin aspart  0-20 Units Subcutaneous Q4H  . insulin glargine  20 Units Subcutaneous QHS  . linaclotide  290 mcg Oral QAC breakfast  . loratadine  10 mg Oral Daily  . Melatonin  5 mg Oral QHS  . methocarbamol  1,000 mg Oral QID  . metoCLOPramide (REGLAN) injection  10 mg Intravenous Q6H  . metoprolol succinate  25 mg Oral Daily  . nicotine  21 mg Transdermal Daily  . oxyCODONE  10 mg Oral BID  . pantoprazole (PROTONIX) IV  40 mg Intravenous Q12H  . polyethylene glycol  17 g Oral BID  . pregabalin  75 mg Oral BID  . senna-docusate  1 tablet Oral BID  . traZODone  50 mg Oral QHS   Continuous Infusions: . sodium chloride  100 mL/hr at 12/03/19 0756     LOS: 1 day    Time spent: 32 mins     Charise Killian, MD [facility] Pager [phone]  If 7PM-7AM, please contact night-coverage [email or web address] 12/03/2019, 8:19 AM

## 2019-12-03 NOTE — ED Notes (Signed)
Pts brief changed. Watery BM in brief. Pts urinary catheter emptied as ell with .

## 2019-12-03 NOTE — ED Notes (Signed)
Pt with hard stool formation in brief. Pt cleaned and new brief applied.

## 2019-12-03 NOTE — ED Notes (Signed)
Pt transported to floor by Swot RN Brandi.

## 2019-12-03 NOTE — ED Notes (Signed)
Attempted to call report x 1  

## 2019-12-03 NOTE — NC FL2 (Signed)
[place] MEDICAID FL2 LEVEL OF CARE SCREENING TOOL     IDENTIFICATION  Patient Name: Eugene [name] Birthdate: 02/24/1961 Sex: male Admission Date (Current Location): 12/02/2019  [place] and [place] Number:  Chiropodist and Address:  [facility], [address], [place], [place] [address]      Provider Number: [id number]  Attending Physician Name and Address:  Charise Killian, MD  Relative Name and Phone Number:  Damian Buckles- brother [phone]    Current Level of Care: Hospital Recommended Level of Care: Skilled Nursing Facility Prior Approval Number:    Date Approved/Denied:   PASRR Number:    Discharge Plan: SNF    Current Diagnoses: Patient Active Problem List   Diagnosis Date Noted  . Intractable nausea and vomiting 12/03/2019  . Nausea and vomiting 10/21/2019  . Suprapubic catheter (HCC)   . Sepsis (HCC) 10/02/2019  . MRSA bacteremia 02/08/2019  . Osteomyelitis of thoracic spine (HCC) 02/08/2019  . Peripheral vascular disease (HCC) 02/08/2019  . Hypertension 02/08/2019  . Dyslipidemia 02/08/2019  . Chronic pain syndrome 02/08/2019  . Polysubstance abuse (HCC) 02/08/2019  . Chronic hepatitis C without hepatic coma (HCC) 02/08/2019  . GERD (gastroesophageal reflux disease) 02/08/2019  . Cigarette smoker 02/08/2019  . Normocytic anemia 02/08/2019  . UTI (urinary tract infection) 02/07/2019  . AKI (acute kidney injury) (HCC) 02/07/2019  . Hyperkalemia 02/07/2019  . Diabetes (HCC) 02/07/2019  . Pressure injury of skin 02/07/2019  . Hyperlipidemia 11/07/2018  . MRSA (methicillin resistant Staphylococcus aureus) septicemia (HCC) 08/17/2017  . Other acute osteomyelitis, left ankle and foot (HCC) 06/29/2017  . Diabetic ulcer of right foot associated with diabetes mellitus due to underlying condition (HCC) 06/27/2017    Orientation RESPIRATION BLADDER Height & Weight     Self, Time, Situation, Place  Normal  Indwelling catheter(Suprapubic catheter) Weight: 108.9 kg Height:     BEHAVIORAL SYMPTOMS/MOOD NEUROLOGICAL BOWEL NUTRITION STATUS      Incontinent Diet(Thin liquids to be advanced - see DC summary)  AMBULATORY STATUS COMMUNICATION OF NEEDS Skin   Total Care Verbally Normal                       Personal Care Assistance Level of Assistance  Bathing, Feeding, Dressing Bathing Assistance: Maximum assistance Feeding assistance: Limited assistance Dressing Assistance: Maximum assistance     Functional Limitations Info             SPECIAL CARE FACTORS FREQUENCY                       Contractures Contractures Info: Not present    Additional Factors Info  Code Status, Allergies Code Status Info: Full Allergies Info: NKA           Current Medications (12/03/2019):  This is the current hospital active medication list Current Facility-Administered Medications  Medication Dose Route Frequency Provider Last Rate Last Admin  . 0.9 %  sodium chloride infusion   Intravenous Continuous Mansy, Vernetta Honey, MD 100 mL/hr at 12/03/19 0756 New Bag at 12/03/19 0756  . acetaminophen (TYLENOL) tablet 650 mg  650 mg Oral Q6H PRN Mansy, Jan A, MD       Or  . acetaminophen (TYLENOL) suppository 650 mg  650 mg Rectal Q6H PRN Mansy, Jan A, MD      . amLODipine (NORVASC) tablet 10 mg  10 mg Oral Daily Mansy, Jan A, MD      . bisacodyl (DULCOLAX) suppository  10 mg  10 mg Rectal Daily PRN Mansy, Jan A, MD      . enoxaparin (LOVENOX) injection 40 mg  40 mg Subcutaneous Q24H Mansy, Jan A, MD   40 mg at 12/03/19 0841  . famotidine (PEPCID) tablet 20 mg  20 mg Oral Daily Mansy, Jan A, MD      . ferrous gluconate [facility]) tablet 324 mg  324 mg Oral BID WC Mansy, Vernetta Honey, MD   Stopped at 12/03/19 0845  . furosemide (LASIX) tablet 40 mg  40 mg Oral BID Mansy, Jan A, MD   Stopped at 12/03/19 0845  . hydrALAZINE (APRESOLINE) tablet 50 mg  50 mg Oral TID Mansy, Jan A, MD      . insulin aspart (novoLOG)  injection 0-20 Units  0-20 Units Subcutaneous Q4H Mansy, Vernetta Honey, MD   15 Units at 12/03/19 0841  . insulin glargine (LANTUS) injection 20 Units  20 Units Subcutaneous QHS Mansy, Jan A, MD      . labetalol (NORMODYNE) injection 20 mg  20 mg Intravenous Q3H PRN Mansy, Jan A, MD      . linaclotide Karl[facility] Einstein) capsule 290 mcg  290 mcg Oral QAC breakfast Darci Current, MD      . loratadine (CLARITIN) tablet 10 mg  10 mg Oral Daily Mansy, Jan A, MD      . magnesium hydroxide (MILK OF MAGNESIA) suspension 30 mL  30 mL Oral Daily PRN Mansy, Jan A, MD      . Melatonin TABS 5 mg  5 mg Oral QHS Mansy, Jan A, MD      . methocarbamol (ROBAXIN) tablet 1,000 mg  1,000 mg Oral QID Mansy, Jan A, MD      . metoCLOPramide (REGLAN) injection 10 mg  10 mg Intravenous Q6H Mansy, Jan A, MD   10 mg at 12/03/19 1227  . metoprolol succinate (TOPROL-XL) 24 hr tablet 25 mg  25 mg Oral Daily Mansy, Jan A, MD      . morphine 2 MG/ML injection 2 mg  2 mg Intravenous Q4H PRN Mansy, Jan A, MD      . nicotine (NICODERM CQ - dosed in mg/24 hours) patch 21 mg  21 mg Transdermal Daily Mansy, Jan A, MD   21 mg at 12/03/19 1227  . ondansetron (ZOFRAN) injection 4 mg  4 mg Intravenous Q6H PRN Mansy, Jan A, MD      . ondansetron [facility]) tablet 4 mg  4 mg Oral Q6H PRN Mansy, Jan A, MD      . oxyCODONE (Oxy IR/ROXICODONE) immediate release tablet 10 mg  10 mg Oral Q4H PRN Mansy, Jan A, MD      . oxyCODONE (OXYCONTIN) 12 hr tablet 10 mg  10 mg Oral BID Mansy, Jan A, MD      . pantoprazole (PROTONIX) injection 40 mg  40 mg Intravenous Q12H Mansy, Jan A, MD   40 mg at 12/03/19 1227  . polyethylene glycol (MIRALAX / GLYCOLAX) packet 17 g  17 g Oral BID Mansy, Jan A, MD      . pregabalin (LYRICA) capsule 75 mg  75 mg Oral BID Mansy, Jan A, MD      . scopolamine (TRANSDERM-SCOP) 1 MG/3DAYS 1.5 mg  1 patch Transdermal Q72H Charise Killian, MD   1.5 mg at 12/03/19 1004  . senna-docusate (Senokot-S) tablet 1 tablet  1 tablet Oral BID Mansy,  Jan A, MD      . sodium phosphate (FLEET) 7-19 GM/118ML enema  1 enema  1 enema Rectal Q48H PRN Mansy, Jan A, MD      . traZODone (DESYREL) tablet 50 mg  50 mg Oral QHS Mansy, Arvella Merles, MD         Discharge Medications: Please see discharge summary for a list of discharge medications.  Relevant Imaging Results:  Relevant Lab Results:   Additional Information SS# [id number]  Shelbie Hutching, RN

## 2019-12-03 NOTE — ED Notes (Signed)
SMOG enema stopped after 1/2 way complete due to pts intolerance. Stool removed post enema completion.

## 2019-12-03 NOTE — Progress Notes (Signed)
Pt. Tolerating ice chips and sips of water well. Still unable to take PO medications at this time but states N/V is starting to ease up. No other needs noted at this time. Call light is within reach.

## 2019-12-04 ENCOUNTER — Ambulatory Visit
Admission: RE | Admit: 2019-12-04 | Discharge: 2019-12-04 | Disposition: A | Discharge status: Home / Self Care | Payer: Medicaid Other | Source: Ambulatory Visit | Attending: Urology | Admitting: Urology

## 2019-12-04 ENCOUNTER — Ambulatory Visit: Admission: RE | Admit: 2019-12-04 | Payer: Medicaid Other | Source: Ambulatory Visit

## 2019-12-04 DIAGNOSIS — Z435 Encounter for attention to cystostomy: Secondary | ICD-10-CM | POA: Insufficient documentation

## 2019-12-04 DIAGNOSIS — T83010A Breakdown (mechanical) of cystostomy catheter, initial encounter: Secondary | ICD-10-CM

## 2019-12-04 DIAGNOSIS — N319 Neuromuscular dysfunction of bladder, unspecified: Secondary | ICD-10-CM

## 2019-12-04 DIAGNOSIS — E118 Type 2 diabetes mellitus with unspecified complications: Secondary | ICD-10-CM

## 2019-12-04 DIAGNOSIS — T8389XS Other specified complication of genitourinary prosthetic devices, implants and grafts, sequela: Secondary | ICD-10-CM

## 2019-12-04 DIAGNOSIS — N368 Other specified disorders of urethra: Secondary | ICD-10-CM | POA: Insufficient documentation

## 2019-12-04 HISTORY — PX: IR CATHETER TUBE CHANGE: IMG717

## 2019-12-04 LAB — COMPREHENSIVE METABOLIC PANEL
ALT: 43 U/L (ref 0–44)
AST: 27 U/L (ref 15–41)
Albumin: 3.6 g/dL (ref 3.5–5.0)
Alkaline Phosphatase: 73 U/L (ref 38–126)
Anion gap: 7 (ref 5–15)
BUN: 22 mg/dL — ABNORMAL HIGH (ref 6–20)
CO2: 23 mmol/L (ref 22–32)
Calcium: 8.4 mg/dL — ABNORMAL LOW (ref 8.9–10.3)
Chloride: 110 mmol/L (ref 98–111)
Creatinine, Ser: 0.94 mg/dL (ref 0.61–1.24)
GFR calc Af Amer: >60 mL/min (ref >60)
GFR calc non Af Amer: >60 mL/min (ref >60)
Glucose, Bld: 171 mg/dL — ABNORMAL HIGH (ref 70–99)
Potassium: 3.5 mmol/L (ref 3.5–5.1)
Sodium: 140 mmol/L (ref 135–145)
Total Bilirubin: 0.9 mg/dL (ref 0.3–1.2)
Total Protein: 6.8 g/dL (ref 6.5–8.1)

## 2019-12-04 LAB — GLUCOSE, CAPILLARY
Glucose-Capillary: 174 mg/dL — ABNORMAL HIGH (ref 70–99)
Glucose-Capillary: 145 mg/dL — ABNORMAL HIGH (ref 70–99)
Glucose-Capillary: 212 mg/dL — ABNORMAL HIGH (ref 70–99)
Glucose-Capillary: 240 mg/dL — ABNORMAL HIGH (ref 70–99)

## 2019-12-04 LAB — CBC
HCT: 33.3 % — ABNORMAL LOW (ref 39.0–52.0)
Hemoglobin: 11 g/dL — ABNORMAL LOW (ref 13.0–17.0)
MCH: 30.8 pg (ref 26.0–34.0)
MCHC: 33 g/dL (ref 30.0–36.0)
MCV: 93.3 fL (ref 80.0–100.0)
Platelets: 179 10*3/uL (ref 150–400)
RBC: 3.57 MIL/uL — ABNORMAL LOW (ref 4.22–5.81)
RDW: 12.9 % (ref 11.5–15.5)
WBC: 8.7 10*3/uL (ref 4.0–10.5)
nRBC: 0 % (ref 0.0–0.2)

## 2019-12-04 MED ORDER — DICYCLOMINE HCL 20 MG PO TABS: 20.0000 mg | ORAL_TABLET | Freq: Once | ORAL | Status: DC

## 2019-12-04 MED ORDER — IODIXANOL 320 MG/ML IV SOLN
50.0000 mL | Freq: Once | INTRAVENOUS | Status: AC | PRN
  Administered 2019-12-04: 10 mL

## 2019-12-04 MED ORDER — DOCUSATE SODIUM 100 MG PO CAPS
200.0000 mg | ORAL_CAPSULE | Freq: Two times a day (BID) | ORAL | Status: DC
  Filled 2019-12-04 (×2): qty 2

## 2019-12-04 MED ORDER — LIDOCAINE VISCOUS HCL 2 % MT SOLN
OROMUCOSAL | Status: AC
  Filled 2019-12-04: qty 15

## 2019-12-04 NOTE — Progress Notes (Signed)
All P.O. medications and insulin held at this time per pt. Complaining of nausea and not tolerating diet. MD aware. No additional needs noted at time. Call light is within reach, will continue to monitor.

## 2019-12-04 NOTE — Progress Notes (Signed)
PROGRESS NOTE    Eugene [name]  NUU:[id number] DOB: 07/20/1961 DOA: 12/02/2019 PCP: Abner Greenspan, MD      Assessment & Plan:   Active Problems:   Intractable nausea and vomiting   Intractable nausea and vomiting: etiology unclear, possibly secondary to diabetic gastroparesis. Improving, tolerating ice chips & some liquids. S/p disimpaction for constipation. Continue on scheduled IV Reglan. Continue w/ scopolamine patch. Continue on IVFs. Continue on PPI   Exposed sutures of suprapubic catheter: urology consulted and IR notified. Still draining currently but pt c/o abd pain in that area   DM2: uncontrolled with mild DKA. Continue on SSI w/ accuchecks. HbA1c 8.1  Hypertensive urgency: resolved. Continue on amlodipine, lasix & metoprolol. IV labetalol prn.  Peripheral neuropathy: continue on home dose of pregabalin   Obesity: BMI: 32.5. Would benefit from weight loss.  Transaminitis: resolved   DVT prophylaxis: lovenox Code Status: full  Family Communication: discussed pt's care w/ pt's brother, Micah Noel, and answered his questions  Disposition Plan:  When pt is no longer vomiting and tolerating a po diet; will return to home facility likely   Consultants:    Procedures:    Antimicrobials: n/a   Subjective: Pt c/o abd pain  Objective: Vitals:   12/03/19 1204 12/03/19 1526 12/03/19 1700 12/03/19 2244  BP: (!) 153/90 (!) 158/82 (!) 158/82 (!) 146/66  Pulse: 84 93 93 (!) 57  Resp: 18 20 (!) 21 18  Temp: 99.1 F (37.3 C)  98.9 F (37.2 C) 98.6 F (37 C)  TempSrc: Oral  Oral   SpO2: 100% 97%  100%  Weight:   108 kg   Height:   6' (1.829 m)     Intake/Output Summary (Last 24 hours) at 12/04/2019 0713 Last data filed at 12/03/2019 2230 Gross per 24 hour  Intake 590 ml  Output 800 ml  Net -210 ml   Filed Weights   12/03/19 0500 12/03/19 1700  Weight: 108.9 kg 108 kg    Examination:  General exam: Appears calm and comfortable  Respiratory system: Clear  to auscultation. No wheezes, rales Cardiovascular system: S1 & S2 +. No rubs, gallops or clicks. Gastrointestinal system: Abdomen is obese, soft and nontender. Hypoactive bowel sounds heard. Central nervous system: Alert and oriented.  Psychiatry: Judgement and insight appear normal. Flat mood and affect    Data Reviewed: I have personally reviewed following labs and imaging studies  CBC: Recent Labs  Lab 12/02/19 2131 12/03/19 0719 12/04/19 0542  WBC 11.6* 9.2 8.7  HGB 13.9 13.1 11.0*  HCT 41.1 39.3 33.3*  MCV 91.3 92.5 93.3  PLT 210 216 179   Basic Metabolic Panel: Recent Labs  Lab 12/02/19 2131 12/03/19 0719 12/04/19 0542  NA 138 141 140  K 4.1 4.5 3.5  CL 103 106 110  CO2 19* 20* 23  GLUCOSE 261* 338* 171*  BUN 21* 22* 22*  CREATININE 1.07 1.21 0.94  CALCIUM 9.3 9.2 8.4*   GFR: Estimated Creatinine Clearance: 111.5 mL/min (by C-G formula based on SCr of 0.94 mg/dL). Liver Function Tests: Recent Labs  Lab 12/02/19 2131 12/04/19 0542  AST 29 27  ALT 61* 43  ALKPHOS 102 73  BILITOT 1.2 0.9  PROT 8.1 6.8  ALBUMIN 4.1 3.6   Recent Labs  Lab 12/02/19 2131  LIPASE 14   No results for input(s): AMMONIA in the last 168 hours. Coagulation Profile: No results for input(s): INR, PROTIME in the last 168 hours. Cardiac Enzymes: No results for input(s): CKTOTAL, CKMB, CKMBINDEX,  TROPONINI in the last 168 hours. BNP (last 3 results) No results for input(s): PROBNP in the last 8760 hours. HbA1C: Recent Labs    12/03/19 0719  HGBA1C 8.1*   CBG: Recent Labs  Lab 12/03/19 1143 12/03/19 1709 12/03/19 1932 12/03/19 2320 12/04/19 0402  GLUCAP 233* 229* 249* 194* 174*   Lipid Profile: No results for input(s): CHOL, HDL, LDLCALC, TRIG, CHOLHDL, LDLDIRECT in the last 72 hours. Thyroid Function Tests: No results for input(s): TSH, T4TOTAL, FREET4, T3FREE, THYROIDAB in the last 72 hours. Anemia Panel: No results for input(s): VITAMINB12, FOLATE, FERRITIN,  TIBC, IRON, RETICCTPCT in the last 72 hours. Sepsis Labs: No results for input(s): PROCALCITON, LATICACIDVEN in the last 168 hours.  Recent Results (from the past 240 hour(s))  SARS CORONAVIRUS 2 (TAT 6-24 HRS) Nasopharyngeal Nasopharyngeal Swab     Status: None   Collection Time: 12/03/19  3:08 AM   Specimen: Nasopharyngeal Swab  Result Value Ref Range Status   SARS Coronavirus 2 NEGATIVE NEGATIVE Final    Comment: (NOTE) SARS-CoV-2 target nucleic acids are NOT DETECTED. The SARS-CoV-2 RNA is generally detectable in upper and lower respiratory specimens during the acute phase of infection. Negative results do not preclude SARS-CoV-2 infection, do not rule out co-infections with other pathogens, and should not be used as the sole basis for treatment or other patient management decisions. Negative results must be combined with clinical observations, patient history, and epidemiological information. The expected result is Negative. Fact Sheet for Patients: SugarRoll.be Fact Sheet for Healthcare Providers: https://www.woods-mathews.com/ This test is not yet approved or cleared by the [place] FDA and  has been authorized for detection and/or diagnosis of SARS-CoV-2 by FDA under an Emergency Use Authorization (EUA). This EUA will remain  in effect (meaning this test can be used) for the duration of the COVID-19 declaration under Section 56 4(b)(1) of the Act, 21 U.S.C. section 360bbb-3(b)(1), unless the authorization is terminated or revoked sooner. Performed at [facility], [place] [address], [place], [place] [address]   MRSA PCR Screening     Status: Abnormal   Collection Time: 12/03/19 11:03 AM   Specimen: Nasopharyngeal  Result Value Ref Range Status   MRSA by PCR POSITIVE (A) NEGATIVE Final    Comment:        The GeneXpert MRSA Assay (FDA approved for NASAL specimens only), is one component of a comprehensive MRSA  colonization surveillance program. It is not intended to diagnose MRSA infection nor to guide or monitor treatment for MRSA infections. RESULT CALLED TO, READ BACK BY AND VERIFIED WITH:  [facility] BIRDWELL AT 1330 12/03/19 SDR Performed at [facility], [address] [place], [place] [address]          Radiology Studies: CT ABDOMEN PELVIS W CONTRAST  Result Date: 12/03/2019 CLINICAL DATA:  59 year old male with abdominal pain, no bowel movement in 8 days. EXAM: CT ABDOMEN AND PELVIS WITH CONTRAST TECHNIQUE: Multidetector CT imaging of the abdomen and pelvis was performed using the standard protocol following bolus administration of intravenous contrast. CONTRAST:  13mL OMNIPAQUE IOHEXOL 300 MG/ML  SOLN COMPARISON:  CT Abdomen and Pelvis 10/21/2019. FINDINGS: Lower chest: Similar appearance of circumferential distal esophageal wall thickening. No lower posterior mediastinal lymphadenopathy. No pericardial or pleural effusion. Mild right lower lobe lung scarring. Hepatobiliary: Gallbladder and liver within normal limits. No bile duct enlargement. Pancreas: Atrophied but otherwise negative. Spleen: Negative. Adrenals/Urinary Tract: Normal adrenal glands. Symmetric renal enhancement and contrast excretion. No nephrolithiasis identified. No hydronephrosis.  There is a chronic suprapubic catheter and the urinary bladder is decompressed today. Stomach/Bowel: Persistent distal fecal impaction, not improved since January and with mildly increased distal sigmoid retained stool. There is distal sigmoid and rectal wall thickening and perirectal inflammatory stranding. Redundant sigmoid colon which loops in the right lower quadrant. The mid sigmoid is decompressed and without stool. Mild diverticulosis is noted. Retained stool continues however at the junction of the sigmoid and descending colon and throughout redundant left colon. Redundant transverse colon although more containing gas. The cecum is  on a lax mesentery in the mid abdomen and distended with gas. No dilated small bowel. Decompressed stomach. Small fat containing umbilical hernia is stable. No free air. No free fluid. Vascular/Lymphatic: Mild aortoiliac calcified atherosclerosis. The major arterial structures appear patent. Portal venous system is patent. Reproductive: Negative. Other: No pelvic free fluid, but persistent and increased perirectal and presacral inflammatory stranding (series 2, image 76). Musculoskeletal: Lower thoracic spinal rods and previous T8-T9 corpectomy and posterior decompression. Chronic L1 compression fracture. No acute osseous abnormality identified. IMPRESSION: 1. Progressed distal Fecal Impaction since January with associated Acute Proctitis / Stercoral Colitis. 2. Redundant large bowel, and substantial retained stool also in the left colon. But no dilated small bowel. 3. Stable suprapubic catheter, decompressed urinary bladder. Electronically Signed   By: Odessa Fleming M.D.   On: 12/03/2019 00:07        Scheduled Meds: . amLODipine  10 mg Oral Daily  . enoxaparin (LOVENOX) injection  40 mg Subcutaneous Q24H  . famotidine  20 mg Oral Daily  . ferrous gluconate  324 mg Oral BID WC  . furosemide  40 mg Oral BID  . hydrALAZINE  50 mg Oral TID  . insulin aspart  0-20 Units Subcutaneous Q4H  . insulin glargine  20 Units Subcutaneous QHS  . linaclotide  290 mcg Oral QAC breakfast  . loratadine  10 mg Oral Daily  . Melatonin  5 mg Oral QHS  . methocarbamol  1,000 mg Oral QID  . metoCLOPramide (REGLAN) injection  10 mg Intravenous Q6H  . metoprolol succinate  25 mg Oral Daily  . nicotine  21 mg Transdermal Daily  . oxyCODONE  10 mg Oral BID  . pantoprazole (PROTONIX) IV  40 mg Intravenous Q12H  . polyethylene glycol  17 g Oral BID  . pregabalin  75 mg Oral BID  . scopolamine  1 patch Transdermal Q72H  . senna-docusate  1 tablet Oral BID  . traZODone  50 mg Oral QHS   Continuous Infusions: . sodium  chloride 100 mL/hr at 12/04/19 0311     LOS: 1 day    Time spent: 30 mins     Charise Killian, MD [facility] Pager [phone]  If 7PM-7AM, please contact night-coverage [email or web address] 12/04/2019, 7:13 AM

## 2019-12-04 NOTE — Progress Notes (Signed)
  Patient was on IR schedule this morning for SP cath exchange.  Chart reviewed, events noted.  There is no urgency for routine cath exchange. Will hold off for now and reschedule as outpatient when patient is medically stable.  Errin Whitelaw S Tiron Suski PA-C 12/04/2019 8:11 AM

## 2019-12-04 NOTE — Procedures (Signed)
Pre procedural Dx: Neurogenic bladder Post procedural Dx: Same  Technically successful image guided replacement and conversion to 16 fr Council balloon retention suprapubic catheter.  EBL: Trace Complications: None immediate  Katherina Right, MD Pager #: [phone]

## 2019-12-04 NOTE — Progress Notes (Signed)
Bedside shift report received from [facility], [place].  Morning assessment completed. Pt. Resting in bed, arouses easily to name calling. Denies any current needs. Call light is within reach. Will continue to monitor.

## 2019-12-04 NOTE — Plan of Care (Signed)
Midway through shift, pt experienced severe abdominal pain and nausea.  Pain and nausea meds administered w/relief.  Pt still unable to take oral medications, concerned this may be part of problem.

## 2019-12-04 NOTE — Progress Notes (Signed)
Pt. Complaining of nausea - dry heaving. PRN zofran given per orders. No additional needs noted.   Offered to get pt. Up in chair, pt. States he is just not feeling well enough to transfer.  Call light is within reach. Will continue to monitor.

## 2019-12-05 ENCOUNTER — Inpatient Hospital Stay: Payer: Medicaid Other

## 2019-12-05 DIAGNOSIS — N319 Neuromuscular dysfunction of bladder, unspecified: Secondary | ICD-10-CM

## 2019-12-05 DIAGNOSIS — Z435 Encounter for attention to cystostomy: Secondary | ICD-10-CM

## 2019-12-05 DIAGNOSIS — K5900 Constipation, unspecified: Secondary | ICD-10-CM

## 2019-12-05 DIAGNOSIS — I10 Essential (primary) hypertension: Secondary | ICD-10-CM

## 2019-12-05 LAB — BASIC METABOLIC PANEL
Anion gap: 10 (ref 5–15)
BUN: 13 mg/dL (ref 6–20)
CO2: 23 mmol/L (ref 22–32)
Calcium: 8.5 mg/dL — ABNORMAL LOW (ref 8.9–10.3)
Chloride: 107 mmol/L (ref 98–111)
Creatinine, Ser: 0.97 mg/dL (ref 0.61–1.24)
GFR calc Af Amer: >60 mL/min (ref >60)
GFR calc non Af Amer: >60 mL/min (ref >60)
Glucose, Bld: 234 mg/dL — ABNORMAL HIGH (ref 70–99)
Potassium: 3.2 mmol/L — ABNORMAL LOW (ref 3.5–5.1)
Sodium: 140 mmol/L (ref 135–145)

## 2019-12-05 LAB — CBC
HCT: 36.6 % — ABNORMAL LOW (ref 39.0–52.0)
Hemoglobin: 12.2 g/dL — ABNORMAL LOW (ref 13.0–17.0)
MCH: 30.6 pg (ref 26.0–34.0)
MCHC: 33.3 g/dL (ref 30.0–36.0)
MCV: 91.7 fL (ref 80.0–100.0)
Platelets: 178 10*3/uL (ref 150–400)
RBC: 3.99 MIL/uL — ABNORMAL LOW (ref 4.22–5.81)
RDW: 12.5 % (ref 11.5–15.5)
WBC: 9.7 10*3/uL (ref 4.0–10.5)
nRBC: 0 % (ref 0.0–0.2)

## 2019-12-05 MED ORDER — PROMETHAZINE HCL 25 MG/ML IJ SOLN
12.5000 mg | Freq: Once | INTRAMUSCULAR | Status: AC
  Administered 2019-12-05: 12.5 mg via INTRAVENOUS
  Filled 2019-12-05: qty 1

## 2019-12-05 MED ORDER — BISACODYL 10 MG RE SUPP
10.0000 mg | Freq: Every day | RECTAL | Status: DC
  Administered 2019-12-05: 10 mg via RECTAL
  Filled 2019-12-05 (×3): qty 1

## 2019-12-05 MED ORDER — LORAZEPAM 2 MG/ML IJ SOLN
2.0000 mg | Freq: Once | INTRAMUSCULAR | Status: AC
  Administered 2019-12-05: 22:00:00 2 mg via INTRAVENOUS
  Filled 2019-12-05: qty 1

## 2019-12-05 NOTE — Progress Notes (Signed)
PROGRESS NOTE    Eugene [name]  YBF:[id number] DOB: 1961-03-15 DOA: 12/02/2019 PCP: Abner Greenspan, MD      Assessment & Plan:   Active Problems:   Intractable nausea and vomiting   Intractable nausea and vomiting: etiology unclear, possibly secondary to diabetic gastroparesis. Intermittent as pt still c/o nausea & dry heaves today. Continue on scheduled IV Reglan. Continue w/ scopolamine patch. Continue on IVFs. Continue on PPI   Constipation: S/p disimpaction for constipation. Continue w/ colace, dulcolax supp. Enema prn   Exposed sutures of suprapubic catheter: suprapubic catheter replaced by IR 12/04/19. Resolved  DM2: uncontrolled with mild DKA. Continue on SSI w/ accuchecks. HbA1c 8.1  Hypertensive urgency: resolved. Continue on amlodipine, lasix & metoprolol. IV labetalol prn.  Peripheral neuropathy: continue on home dose of pregabalin   Obesity: BMI: 32.5. Would benefit from weight loss.  Transaminitis: resolved   DVT prophylaxis: lovenox Code Status: full  Family Communication:  Disposition Plan:  When pt is no longer vomiting and tolerating a po diet; will return to home facility likely   Consultants:    Procedures:    Antimicrobials: n/a   Subjective: Pt c/o nausea & dry heaves   Objective: Vitals:   12/03/19 1700 12/03/19 2244 12/04/19 1613 12/04/19 2337  BP: (!) 158/82 (!) 146/66 138/78 (!) 150/66  Pulse: 93 (!) 57 (!) 54 (!) 53  Resp: (!) 21 18 18 18   Temp: 98.9 F (37.2 C) 98.6 F (37 C) 98.5 F (36.9 C) (!) 97.5 F (36.4 C)  TempSrc: Oral     SpO2:  100% 100% 100%  Weight: 108 kg     Height: 6' (1.829 m)       Intake/Output Summary (Last 24 hours) at 12/05/2019 0713 Last data filed at 12/05/2019 0500 Gross per 24 hour  Intake 2518.01 ml  Output 1000 ml  Net 1518.01 ml   Filed Weights   12/03/19 0500 12/03/19 1700  Weight: 108.9 kg 108 kg    Examination:  General exam: Appears calm and comfortable  Respiratory system:  Clear to auscultation. No accessory muscle use. No rales Cardiovascular system: S1 & S2 +. No rubs, gallops or clicks. Gastrointestinal system: Abdomen is obese, soft and nontender. Hypoactive bowel sounds heard. Central nervous system: Alert and oriented.  Psychiatry: Judgement and insight appear normal. Flat mood and affect    Data Reviewed: I have personally reviewed following labs and imaging studies  CBC: Recent Labs  Lab 12/02/19 2131 12/03/19 0719 12/04/19 0542  WBC 11.6* 9.2 8.7  HGB 13.9 13.1 11.0*  HCT 41.1 39.3 33.3*  MCV 91.3 92.5 93.3  PLT 210 216 179   Basic Metabolic Panel: Recent Labs  Lab 12/02/19 2131 12/03/19 0719 12/04/19 0542  NA 138 141 140  K 4.1 4.5 3.5  CL 103 106 110  CO2 19* 20* 23  GLUCOSE 261* 338* 171*  BUN 21* 22* 22*  CREATININE 1.07 1.21 0.94  CALCIUM 9.3 9.2 8.4*   GFR: Estimated Creatinine Clearance: 111.5 mL/min (by C-G formula based on SCr of 0.94 mg/dL). Liver Function Tests: Recent Labs  Lab 12/02/19 2131 12/04/19 0542  AST 29 27  ALT 61* 43  ALKPHOS 102 73  BILITOT 1.2 0.9  PROT 8.1 6.8  ALBUMIN 4.1 3.6   Recent Labs  Lab 12/02/19 2131  LIPASE 14   No results for input(s): AMMONIA in the last 168 hours. Coagulation Profile: No results for input(s): INR, PROTIME in the last 168 hours. Cardiac Enzymes: No results for input(s):  CKTOTAL, CKMB, CKMBINDEX, TROPONINI in the last 168 hours. BNP (last 3 results) No results for input(s): PROBNP in the last 8760 hours. HbA1C: Recent Labs    12/03/19 0719  HGBA1C 8.1*   CBG: Recent Labs  Lab 12/03/19 2320 12/04/19 0402 12/04/19 0812 12/04/19 1152 12/04/19 2022  GLUCAP 194* 174* 145* 212* 240*   Lipid Profile: No results for input(s): CHOL, HDL, LDLCALC, TRIG, CHOLHDL, LDLDIRECT in the last 72 hours. Thyroid Function Tests: No results for input(s): TSH, T4TOTAL, FREET4, T3FREE, THYROIDAB in the last 72 hours. Anemia Panel: No results for input(s):  VITAMINB12, FOLATE, FERRITIN, TIBC, IRON, RETICCTPCT in the last 72 hours. Sepsis Labs: No results for input(s): PROCALCITON, LATICACIDVEN in the last 168 hours.  Recent Results (from the past 240 hour(s))  SARS CORONAVIRUS 2 (TAT 6-24 HRS) Nasopharyngeal Nasopharyngeal Swab     Status: None   Collection Time: 12/03/19  3:08 AM   Specimen: Nasopharyngeal Swab  Result Value Ref Range Status   SARS Coronavirus 2 NEGATIVE NEGATIVE Final    Comment: (NOTE) SARS-CoV-2 target nucleic acids are NOT DETECTED. The SARS-CoV-2 RNA is generally detectable in upper and lower respiratory specimens during the acute phase of infection. Negative results do not preclude SARS-CoV-2 infection, do not rule out co-infections with other pathogens, and should not be used as the sole basis for treatment or other patient management decisions. Negative results must be combined with clinical observations, patient history, and epidemiological information. The expected result is Negative. Fact Sheet for Patients: SugarRoll.be Fact Sheet for Healthcare Providers: https://www.woods-mathews.com/ This test is not yet approved or cleared by the [place] FDA and  has been authorized for detection and/or diagnosis of SARS-CoV-2 by FDA under an Emergency Use Authorization (EUA). This EUA will remain  in effect (meaning this test can be used) for the duration of the COVID-19 declaration under Section 56 4(b)(1) of the Act, 21 U.S.C. section 360bbb-3(b)(1), unless the authorization is terminated or revoked sooner. Performed at [facility], [place] [address] [place], [place] [address]   MRSA PCR Screening     Status: Abnormal   Collection Time: 12/03/19 11:03 AM   Specimen: Nasopharyngeal  Result Value Ref Range Status   MRSA by PCR POSITIVE (A) NEGATIVE Final    Comment:        The GeneXpert MRSA Assay (FDA approved for NASAL specimens only), is one component of  a comprehensive MRSA colonization surveillance program. It is not intended to diagnose MRSA infection nor to guide or monitor treatment for MRSA infections. RESULT CALLED TO, READ BACK BY AND VERIFIED WITH:  [facility] BIRDWELL AT 1330 12/03/19 SDR Performed at [facility], [address] [place], [place] [address]          Radiology Studies: IR Catheter Tube Change  Result Date: 12/04/2019 INDICATION: History of neurogenic bladder with chronic suprapubic catheter. Patient presents today for fluoroscopic guided exchange conversion of existing 42 French all-purpose drainage catheter to a 16 French balloon retention Council catheter. EXAM: FLUOROSCOPIC GUIDED SUPRAPUBIC CATHETER EXCHANGE COMPARISON:  Image guided suprapubic catheter placement - 10/07/2019 MEDICATIONS: None ANESTHESIA/SEDATION: None CONTRAST:  None FLUOROSCOPY TIME:  30 seconds (5.8 mGy) COMPLICATIONS: None immediate. PROCEDURE: The procedure, risks, benefits, and alternatives were explained to the patient. Questions regarding the procedure were encouraged and answered. The patient understands and consents to the procedure. A timeout was performed prior to the initiation of the procedure. The external portion of the existing suprapubic catheter as well as the surrounding skin  were prepped and draped in usual sterile fashion A preprocedural spot fluoroscopic image was obtained of the lower pelvis and existing 14 French all-purpose drainage catheter with end coiled and locked overlying the expected location of the urinary bladder. Contrast injection confirmed appropriate positioning and functionality existing suprapubic catheter. The external portion of the existing suprapubic catheter was cut and cannulated with a short Amplatz wire which was coiled within the urinary bladder. Under intermittent fluoroscopic guidance, the track was dilated ultimately allowing placement of a new 16 Fr balloon retention Council catheter.  Appropriate positioning was confirmed with the injection of a small amount of contrast as well as the efflux of urine. The catheter was connected to a gravity bag. A dressing was placed. The patient tolerated the procedure well without immediate postprocedural complication. FINDINGS: Preprocedural imaging demonstrates unchanged positioning of all-purpose drainage catheter with end coiled and locked over the expected location of the urinary bladder. Contrast injection confirms appropriate positioning and functionality of the existing percutaneous drainage catheter. After fluoroscopic guided exchange, a new 16 Fr balloon retention Council catheter is appropriately positioned within the urinary bladder. IMPRESSION: Technically successful fluoroscopic guided exchange, upsize and conversion to a 16 [place], balloon retention Council catheter for the purposes of chronic urinary bladder decompression. Electronically Signed   By: Simonne Come M.D.   On: 12/04/2019 15:03        Scheduled Meds: . amLODipine  10 mg Oral Daily  . docusate sodium  200 mg Oral BID  . enoxaparin (LOVENOX) injection  40 mg Subcutaneous Q24H  . famotidine  20 mg Oral Daily  . ferrous gluconate  324 mg Oral BID WC  . furosemide  40 mg Oral BID  . hydrALAZINE  50 mg Oral TID  . insulin aspart  0-20 Units Subcutaneous Q4H  . insulin glargine  20 Units Subcutaneous QHS  . linaclotide  290 mcg Oral QAC breakfast  . loratadine  10 mg Oral Daily  . Melatonin  5 mg Oral QHS  . methocarbamol  1,000 mg Oral QID  . metoCLOPramide (REGLAN) injection  10 mg Intravenous Q6H  . metoprolol succinate  25 mg Oral Daily  . nicotine  21 mg Transdermal Daily  . oxyCODONE  10 mg Oral BID  . pantoprazole (PROTONIX) IV  40 mg Intravenous Q12H  . polyethylene glycol  17 g Oral BID  . pregabalin  75 mg Oral BID  . scopolamine  1 patch Transdermal Q72H  . senna-docusate  1 tablet Oral BID  . traZODone  50 mg Oral QHS   Continuous Infusions: .  sodium chloride 100 mL/hr at 12/05/19 0500     LOS: 2 days    Time spent: 30 mins     Charise Killian, MD [facility] Pager [phone]  If 7PM-7AM, please contact night-coverage [email or web address] 12/05/2019, 7:13 AM

## 2019-12-05 NOTE — Progress Notes (Signed)
Subjective: Eugene [name] is followed by Dr. Richardo Hanks for suprapubic tube management.  The tube became partially dislodged yesterday and was replaced by IR with a 24fr catheter.  He has some soreness at the site but is otherwise doing well.  ROS:  Review of Systems  Constitutional: Negative for chills and fever.    Anti-infectives: Anti-infectives (From admission, onward)   None      Current Facility-Administered Medications  Medication Dose Route Frequency Provider Last Rate Last Admin  . 0.9 %  sodium chloride infusion   Intravenous Continuous Mansy, Jan A, MD 100 mL/hr at 12/05/19 0500 Rate Verify at 12/05/19 0500  . acetaminophen (TYLENOL) tablet 650 mg  650 mg Oral Q6H PRN Mansy, Jan A, MD       Or  . acetaminophen (TYLENOL) suppository 650 mg  650 mg Rectal Q6H PRN Mansy, Jan A, MD      . amLODipine (NORVASC) tablet 10 mg  10 mg Oral Daily Mansy, Jan A, MD      . bisacodyl (DULCOLAX) suppository 10 mg  10 mg Rectal Daily Charise Killian, MD      . docusate sodium (COLACE) capsule 200 mg  200 mg Oral BID Charise Killian, MD      . enoxaparin (LOVENOX) injection 40 mg  40 mg Subcutaneous Q24H Mansy, Jan A, MD   40 mg at 12/05/19 0514  . famotidine (PEPCID) tablet 20 mg  20 mg Oral Daily Mansy, Jan A, MD      . ferrous gluconate [facility]) tablet 324 mg  324 mg Oral BID WC Mansy, Vernetta Honey, MD   Stopped at 12/03/19 0845  . furosemide (LASIX) tablet 40 mg  40 mg Oral BID Mansy, Jan A, MD   Stopped at 12/03/19 0845  . hydrALAZINE (APRESOLINE) tablet 50 mg  50 mg Oral TID Mansy, Jan A, MD      . insulin aspart (novoLOG) injection 0-20 Units  0-20 Units Subcutaneous Q4H Mansy, Vernetta Honey, MD   Stopped at 12/05/19 0425  . insulin glargine (LANTUS) injection 20 Units  20 Units Subcutaneous QHS Mansy, Vernetta Honey, MD   20 Units at 12/04/19 2208  . labetalol (NORMODYNE) injection 20 mg  20 mg Intravenous Q3H PRN Mansy, Jan A, MD   20 mg at 12/05/19 0729  . linaclotide (LINZESS) capsule 290 mcg  290  mcg Oral QAC breakfast Darci Current, MD      . loratadine (CLARITIN) tablet 10 mg  10 mg Oral Daily Mansy, Jan A, MD      . magnesium hydroxide (MILK OF MAGNESIA) suspension 30 mL  30 mL Oral Daily PRN Mansy, Jan A, MD      . Melatonin TABS 5 mg  5 mg Oral QHS Mansy, Jan A, MD      . methocarbamol (ROBAXIN) tablet 1,000 mg  1,000 mg Oral QID Mansy, Jan A, MD      . metoCLOPramide (REGLAN) injection 10 mg  10 mg Intravenous Q6H Mansy, Jan A, MD   10 mg at 12/05/19 1155  . metoprolol succinate (TOPROL-XL) 24 hr tablet 25 mg  25 mg Oral Daily Mansy, Jan A, MD      . morphine 2 MG/ML injection 2 mg  2 mg Intravenous Q4H PRN Mansy, Jan A, MD   2 mg at 12/05/19 1201  . nicotine (NICODERM CQ - dosed in mg/24 hours) patch 21 mg  21 mg Transdermal Daily Mansy, Jan A, MD   21 mg at 12/05/19 1201  .  ondansetron (ZOFRAN) injection 4 mg  4 mg Intravenous Q6H PRN Mansy, Jan A, MD   4 mg at 12/05/19 0729  . ondansetron (ZOFRAN) tablet 4 mg  4 mg Oral Q6H PRN Mansy, Jan A, MD      . oxyCODONE (Oxy IR/ROXICODONE) immediate release tablet 10 mg  10 mg Oral Q4H PRN Mansy, Jan A, MD   10 mg at 12/04/19 0334  . oxyCODONE (OXYCONTIN) 12 hr tablet 10 mg  10 mg Oral BID Mansy, Jan A, MD      . pantoprazole (PROTONIX) injection 40 mg  40 mg Intravenous Q12H Mansy, Jan A, MD   40 mg at 12/05/19 0729  . polyethylene glycol (MIRALAX / GLYCOLAX) packet 17 g  17 g Oral BID Mansy, Jan A, MD      . pregabalin (LYRICA) capsule 75 mg  75 mg Oral BID Mansy, Jan A, MD      . scopolamine (TRANSDERM-SCOP) 1 MG/3DAYS 1.5 mg  1 patch Transdermal Q72H Charise Killian, MD   1.5 mg at 12/03/19 1004  . senna-docusate (Senokot-S) tablet 1 tablet  1 tablet Oral BID Mansy, Jan A, MD      . sodium phosphate (FLEET) 7-19 GM/118ML enema 1 enema  1 enema Rectal Q48H PRN Mansy, Jan A, MD      . traZODone (DESYREL) tablet 50 mg  50 mg Oral QHS Mansy, Jan A, MD         Objective: Vital signs in last 24 hours: Temp:  [97.5 F (36.4  C)-98.9 F (37.2 C)] 98.6 F (37 C) (03/13 1258) Pulse Rate:  [47-54] 48 (03/13 1258) Resp:  [15-18] 15 (03/13 1258) BP: (138-176)/(66-171) 144/66 (03/13 1258) SpO2:  [99 %-100 %] 99 % (03/13 1258)  Intake/Output from previous day: 03/12 0701 - 03/13 0700 In: 2518 [I.V.:2518] Out: 1000 [Urine:1000] Intake/Output this shift: No intake/output data recorded.   Physical Exam Vitals reviewed.  Constitutional:      Appearance: He is well-developed.  Abdominal:     Comments: SP tube is draining well and in good position.  Sutures are intact.  There is some bloody drainage around the tube but the site is otherwise unremarkable.   Neurological:     Mental Status: He is alert.     Lab Results:  Recent Labs    12/04/19 0542 12/05/19 0936  WBC 8.7 9.7  HGB 11.0* 12.2*  HCT 33.3* 36.6*  PLT 179 178   BMET Recent Labs    12/04/19 0542 12/05/19 0936  NA 140 140  K 3.5 3.2*  CL 110 107  CO2 23 23  GLUCOSE 171* 234*  BUN 22* 13  CREATININE 0.94 0.97  CALCIUM 8.4* 8.5*   PT/INR No results for input(s): LABPROT, INR in the last 72 hours. ABG No results for input(s): PHART, HCO3 in the last 72 hours.  Invalid input(s): PCO2, PO2  Studies/Results: IR Catheter Tube Change  Result Date: 12/04/2019 INDICATION: History of neurogenic bladder with chronic suprapubic catheter. Patient presents today for fluoroscopic guided exchange conversion of existing 67 French all-purpose drainage catheter to a 16 French balloon retention Council catheter. EXAM: FLUOROSCOPIC GUIDED SUPRAPUBIC CATHETER EXCHANGE COMPARISON:  Image guided suprapubic catheter placement - 10/07/2019 MEDICATIONS: None ANESTHESIA/SEDATION: None CONTRAST:  None FLUOROSCOPY TIME:  30 seconds (5.8 mGy) COMPLICATIONS: None immediate. PROCEDURE: The procedure, risks, benefits, and alternatives were explained to the patient. Questions regarding the procedure were encouraged and answered. The patient understands and consents  to the procedure. A timeout was  performed prior to the initiation of the procedure. The external portion of the existing suprapubic catheter as well as the surrounding skin were prepped and draped in usual sterile fashion A preprocedural spot fluoroscopic image was obtained of the lower pelvis and existing 14 French all-purpose drainage catheter with end coiled and locked overlying the expected location of the urinary bladder. Contrast injection confirmed appropriate positioning and functionality existing suprapubic catheter. The external portion of the existing suprapubic catheter was cut and cannulated with a short Amplatz wire which was coiled within the urinary bladder. Under intermittent fluoroscopic guidance, the track was dilated ultimately allowing placement of a new 16 Fr balloon retention Council catheter. Appropriate positioning was confirmed with the injection of a small amount of contrast as well as the efflux of urine. The catheter was connected to a gravity bag. A dressing was placed. The patient tolerated the procedure well without immediate postprocedural complication. FINDINGS: Preprocedural imaging demonstrates unchanged positioning of all-purpose drainage catheter with end coiled and locked over the expected location of the urinary bladder. Contrast injection confirms appropriate positioning and functionality of the existing percutaneous drainage catheter. After fluoroscopic guided exchange, a new 16 Fr balloon retention Council catheter is appropriately positioned within the urinary bladder. IMPRESSION: Technically successful fluoroscopic guided exchange, upsize and conversion to a 11 [place], balloon retention Council catheter for the purposes of chronic urinary bladder decompression. Electronically Signed   By: Sandi Mariscal M.D.   On: 12/04/2019 15:03     Assessment and Plan: SP tube was replaced yesterday and is draining well.   Continue monthly exchanges.       LOS: 2 days    Irine Seal 12/05/2019 [phone] ID: Antonieta Loveless, male   DOB: [birth year], 59 y.o.   MRN: [id number]

## 2019-12-05 NOTE — Progress Notes (Signed)
Bedside shift report received from [place], [place]. Morning assessment completed. Pt. Complaining of nausea/vomiting - has not been able to keep any food or drink down. Pt. Vomiting at bedside. PRN zofran administered per orders.  Vitals checked - BP elevated and PRN bp medication administered.  Call light is within reach, will continue to monitor.

## 2019-12-06 DIAGNOSIS — E876 Hypokalemia: Secondary | ICD-10-CM

## 2019-12-06 LAB — CBC
HCT: 35 % — ABNORMAL LOW (ref 39.0–52.0)
Hemoglobin: 12 g/dL — ABNORMAL LOW (ref 13.0–17.0)
MCH: 31 pg (ref 26.0–34.0)
MCHC: 34.3 g/dL (ref 30.0–36.0)
MCV: 90.4 fL (ref 80.0–100.0)
Platelets: 158 10*3/uL (ref 150–400)
RBC: 3.87 MIL/uL — ABNORMAL LOW (ref 4.22–5.81)
RDW: 12.4 % (ref 11.5–15.5)
WBC: 6.3 10*3/uL (ref 4.0–10.5)
nRBC: 0 % (ref 0.0–0.2)

## 2019-12-06 LAB — BASIC METABOLIC PANEL
Anion gap: 8 (ref 5–15)
BUN: 12 mg/dL (ref 6–20)
CO2: 24 mmol/L (ref 22–32)
Calcium: 8.6 mg/dL — ABNORMAL LOW (ref 8.9–10.3)
Chloride: 108 mmol/L (ref 98–111)
Creatinine, Ser: 0.7 mg/dL (ref 0.61–1.24)
GFR calc Af Amer: >60 mL/min (ref >60)
GFR calc non Af Amer: >60 mL/min (ref >60)
Glucose, Bld: 175 mg/dL — ABNORMAL HIGH (ref 70–99)
Potassium: 3.2 mmol/L — ABNORMAL LOW (ref 3.5–5.1)
Sodium: 140 mmol/L (ref 135–145)

## 2019-12-06 LAB — MAGNESIUM: Magnesium: 2 mg/dL (ref 1.7–2.4)

## 2019-12-06 MED ORDER — SODIUM CHLORIDE 0.9% FLUSH
5.0000 mL | Freq: Three times a day (TID) | INTRAVENOUS | Status: DC
  Administered 2019-12-07 – 2019-12-14 (×14): 5 mL

## 2019-12-06 MED ORDER — POTASSIUM CHLORIDE 10 MEQ/100ML IV SOLN
10.0000 meq | INTRAVENOUS | Status: AC
  Administered 2019-12-06 (×2): 10 meq via INTRAVENOUS
  Filled 2019-12-06 (×4): qty 100

## 2019-12-06 NOTE — Progress Notes (Addendum)
Spoke with NP Jon Billings on call who placed orders for patient to be NPO and placement of NG tube with low intermittent wall suction; 2mg  of ativan was also ordered for patient prior to help with administration of NG tube but patient was combative and all attempts were unsuccessful.  NP was paged again for unsuccessful attempts and says she will come to evaluate patient.

## 2019-12-06 NOTE — Consult Note (Addendum)
Midge Minium, MD [facility]  [address] [place], [place] [address] Phone: [phone] Fax : [phone]  Consultation  Referring Provider:     Dr. Mayford Knife Primary Care Physician:  Abner Greenspan, MD Primary Gastroenterologist:  Dr. At [facility]         Reason for Consultation:     Nausea and vomiting  Date of Admission:  12/02/2019 Date of Consultation:  12/06/2019         HPI:   Perle Gibbon is a 59 y.o. male with a history of diabetic gastroparesis with an EGD in March of last year that showed a irregular Z-line a small hiatal hernia and no other abnormalities.  The patient was having she anemia at that time and required blood transfusions. The patient was admitted on the 11th of this month with abdominal pain and a reported history of intractable nausea vomiting.  The patient resides at [facility] health care skilled nursing facility and has a history of diabetes hepatitis C irritable bowel syndrome and depression.  The patient was treated with IV Reglan and pain management with hydration.  The patient had imaging that showed:  IMPRESSION: 1. Diffusely gas-filled small bowel and colon with gas present to the rectum. Findings may reflect ileus. Moderate burden of stool throughout the colon. 2.  Suprapubic catheter projects in expected location.  It was reported that the patient has been having bowel movements but continues not to have any relief of his nausea and vomiting.  The patient has been given as needed Zofran.  She also had a NG tube placed to low intermittent suction ordered but due to the patient being combative the tube was not placed.  The patient has a suprapubic catheter due to neurogenic bladder in place.  The patient reports that he has a history of gastroparesis and his vomiting usually last 1 day but now it has lasted 5 days.  He has not been able to keep food down and just has dry heaving and abdominal discomfort from all the retching.  Past Medical History:    Diagnosis Date  . Anxiety   . Depression   . Diabetes mellitus without complication (HCC)   . Hepatitis C   . Hyperlipidemia   . IBS (irritable bowel syndrome)   . Osteomyelitis (HCC)   . Spinal stenosis     Past Surgical History:  Procedure Laterality Date  . bilateral amputation Bilateral   . CENTRAL LINE INSERTION-TUNNELED N/A 02/02/2019   Procedure: CENTRAL LINE INSERTION-TUNNELED;  Surgeon: Annice Needy, MD;  Location: [facility] INVASIVE CV LAB;  Service: Cardiovascular;  Laterality: N/A;  . IR CATHETER TUBE CHANGE  12/04/2019  . SPINAL FUSION    . THORACIC SPINE SURGERY  01/2019   extensive washout    Prior to Admission medications   Medication Sig Start Date End Date Taking? Authorizing Provider  acetaminophen (TYLENOL) 325 MG tablet Take 650 mg by mouth every 4 (four) hours as needed for mild pain or fever.   Yes [provider]  amLODipine (NORVASC) 10 MG tablet Take 10 mg by mouth daily.   Yes [provider]  bisacodyl (DULCOLAX) 10 MG suppository Place 10 mg rectally daily as needed for moderate constipation.   Yes [provider]  famotidine (PEPCID) 20 MG tablet Take 20 mg by mouth daily.    Yes [provider]  ferrous gluconate (FERGON) 324 MG tablet Take 324 mg by mouth 2 (two) times daily with a meal.    Yes  [provider]  furosemide (LASIX) 40 MG tablet Take 40 mg by mouth 2 (two) times daily.   Yes [provider]  hydrALAZINE (APRESOLINE) 50 MG tablet Take 50 mg by mouth 3 (three) times daily.   Yes [provider]  insulin glargine (LANTUS) 100 UNIT/ML injection Inject 20 Units into the skin at bedtime.    Yes [provider]  insulin lispro (HUMALOG) 100 UNIT/ML injection Inject 0-15 Units into the skin 4 (four) times daily -  before meals and at bedtime. <150= 0 units 151-200= 3 units 201-250= 6 units 251-300= 9 units 301-350= 12 units 351-400= 15 units >400 call MD   Yes [provider]  linaclotide (LINZESS) 145 MCG CAPS capsule Take 1 capsule (145 mcg total) by mouth daily before breakfast. 10/25/19  Yes Danford, Earl Lites, MD  loratadine (CLARITIN) 10 MG tablet Take 10 mg by mouth daily.   Yes [provider]  Melatonin 5 MG TABS Take 5 mg by mouth at bedtime.   Yes [provider]  methocarbamol (ROBAXIN) 500 MG tablet Take 1,000 mg by mouth 4 (four) times daily.    Yes [provider]  metoCLOPramide (REGLAN) 10 MG tablet Take 1 tablet (10 mg total) by mouth every 8 (eight) hours as needed for nausea, vomiting or refractory nausea / vomiting. 10/29/19 10/28/20 Yes Danford, Earl Lites, MD  metoprolol succinate (TOPROL-XL) 25 MG 24 hr tablet Take 25 mg by mouth daily.   Yes [provider]  nicotine (NICODERM CQ - DOSED IN MG/24 HOURS) 21 mg/24hr patch Place 21 mg onto the skin daily.   Yes [provider]  ondansetron (ZOFRAN) 4 MG tablet Take 4 mg by mouth every 6 (six) hours as needed for nausea or vomiting.   Yes [provider]  oxyCODONE ER 9 MG C12A Take 9 mg by mouth 2 (two) times daily. 10/24/19  Yes Danford, Earl Lites, MD  Oxycodone HCl 10 MG TABS Take 1 tablet (10 mg total) by mouth every 4 (four) hours as needed. Patient taking differently: Take 10 mg by mouth every 4 (four) hours as needed (pain).  10/24/19  Yes Danford, Earl Lites, MD  polyethylene glycol (MIRALAX / GLYCOLAX) 17 g packet Take 17 g by mouth 2 (two) times daily. 10/24/19  Yes Danford, Earl Lites, MD  pregabalin (LYRICA) 75 MG capsule Take 75 mg by mouth 2 (two) times daily.   Yes [provider]  senna-docusate (SENOKOT-S) 8.6-50 MG tablet Take 1 tablet by mouth 2 (two) times daily. 10/24/19  Yes Danford, Earl Lites, MD  sodium phosphate Pediatric (FLEET) 3.5-9.5 GM/59ML enema Place 1 enema rectally every other day as needed for severe constipation.   Yes [provider]  traZODone (DESYREL) 50 MG tablet  Take 50 mg by mouth at bedtime.    Yes [provider]    History reviewed. No pertinent family history.   Social History   Tobacco Use  . Smoking status: Current Every Day Smoker    Packs/day: 0.25    Types: Cigarettes  . Smokeless tobacco: Never Used  Substance Use Topics  . Alcohol use: Not Currently  . Drug use: Not Currently    Allergies as of 12/02/2019  . (No Known Allergies)    Review of Systems:    All systems reviewed and negative except where noted in HPI.   Physical Exam:  Vital signs in last 24 hours: Temp:  [98.2 F (36.8 C)-99 F (37.2 C)] 98.2 F (  36.8 C) (03/14 0920) Pulse Rate:  [48-71] 71 (03/14 0920) Resp:  [15] 15 (03/13 1258) BP: (129-165)/(55-91) 165/91 (03/14 0920) SpO2:  [94 %-99 %] 98 % (03/14 0920) Last BM Date: 12/05/19 General:   Pleasant, cooperative in NAD Head:  Normocephalic and atraumatic. Eyes:   No icterus.   Conjunctiva pink. PERRLA. Ears:  Normal auditory acuity. Neck:  Supple; no masses or thyroidomegaly Lungs: Respirations even and unlabored. Lungs clear to auscultation bilaterally.   No wheezes, crackles, or rhonchi.  Heart:  Regular rate and rhythm;  Without murmur, clicks, rubs or gallops Abdomen:  Soft, nondistended, nontender. Normal bowel sounds. No appreciable masses or hepatomegaly.  No rebound or guarding.  Rectal:  Not performed. Msk: Bilateral BKA's    Extremities:  Without edema, cyanosis or clubbing upper and bilatter BKA's. Neurologic:  Alert and oriented x3;  grossly normal neurologically. Skin:  Intact without significant lesions or rashes. Cervical Nodes:  No significant cervical adenopathy. Psych:  Alert and cooperative. Normal affect.  LAB RESULTS: Recent Labs    12/04/19 0542 12/05/19 0936 12/06/19 0628  WBC 8.7 9.7 6.3  HGB 11.0* 12.2* 12.0*  HCT 33.3* 36.6* 35.0*  PLT 179 178 158   BMET Recent Labs    12/04/19 0542 12/05/19 0936 12/06/19 0628  NA 140 140 140  K 3.5 3.2* 3.2*    CL 110 107 108  CO2 23 23 24   GLUCOSE 171* 234* 175*  BUN 22* 13 12  CREATININE 0.94 0.97 0.70  CALCIUM 8.4* 8.5* 8.6*   LFT Recent Labs    12/04/19 0542  PROT 6.8  ALBUMIN 3.6  AST 27  ALT 43  ALKPHOS 73  BILITOT 0.9   PT/INR No results for input(s): LABPROT, INR in the last 72 hours.  STUDIES: IR Catheter Tube Change  Result Date: 12/04/2019 INDICATION: History of neurogenic bladder with chronic suprapubic catheter. Patient presents today for fluoroscopic guided exchange conversion of existing 72 French all-purpose drainage catheter to a 16 French balloon retention Council catheter. EXAM: FLUOROSCOPIC GUIDED SUPRAPUBIC CATHETER EXCHANGE COMPARISON:  Image guided suprapubic catheter placement - 10/07/2019 MEDICATIONS: None ANESTHESIA/SEDATION: None CONTRAST:  None FLUOROSCOPY TIME:  30 seconds (5.8 mGy) COMPLICATIONS: None immediate. PROCEDURE: The procedure, risks, benefits, and alternatives were explained to the patient. Questions regarding the procedure were encouraged and answered. The patient understands and consents to the procedure. A timeout was performed prior to the initiation of the procedure. The external portion of the existing suprapubic catheter as well as the surrounding skin were prepped and draped in usual sterile fashion A preprocedural spot fluoroscopic image was obtained of the lower pelvis and existing 14 French all-purpose drainage catheter with end coiled and locked overlying the expected location of the urinary bladder. Contrast injection confirmed appropriate positioning and functionality existing suprapubic catheter. The external portion of the existing suprapubic catheter was cut and cannulated with a short Amplatz wire which was coiled within the urinary bladder. Under intermittent fluoroscopic guidance, the track was dilated ultimately allowing placement of a new 16 Fr balloon retention Council catheter. Appropriate positioning was confirmed with the injection  of a small amount of contrast as well as the efflux of urine. The catheter was connected to a gravity bag. A dressing was placed. The patient tolerated the procedure well without immediate postprocedural complication. FINDINGS: Preprocedural imaging demonstrates unchanged positioning of all-purpose drainage catheter with end coiled and locked over the expected location of the urinary bladder. Contrast injection confirms appropriate positioning and functionality of the existing  percutaneous drainage catheter. After fluoroscopic guided exchange, a new 16 Fr balloon retention Council catheter is appropriately positioned within the urinary bladder. IMPRESSION: Technically successful fluoroscopic guided exchange, upsize and conversion to a 75 [place], balloon retention Council catheter for the purposes of chronic urinary bladder decompression. Electronically Signed   By: Sandi Mariscal M.D.   On: 12/04/2019 15:03   DG Abd Portable 1V  Result Date: 12/05/2019 CLINICAL DATA:  Suprapubic catheter change, nausea and vomiting, last bowel movement 2 days ago EXAM: PORTABLE ABDOMEN - 1 VIEW COMPARISON:  CT abdomen pelvis, 12/02/2019 FINDINGS: Diffusely gas-filled small bowel and colon gas present to the rectum. Moderate burden of stool throughout the colon. Suprapubic catheter. Osseous structures are unremarkable. IMPRESSION: 1. Diffusely gas-filled small bowel and colon with gas present to the rectum. Findings may reflect ileus. Moderate burden of stool throughout the colon. 2.  Suprapubic catheter projects in expected location. Electronically Signed   By: Eddie Candle M.D.   On: 12/05/2019 18:47      Impression / Plan:   Assessment: Active Problems:   Intractable nausea and vomiting   Forney Kleinpeter is a 58 y.o. y/o male with intractable nausea and vomiting.  The patient has dry heaving which would be against gastroparesis.  Gastroparesis is usually vomiting up food that has already been digested.  The patient  continues to have nausea and vomiting even when not eating or having retained food.  Plan:  The patient will be set up for an EGD for tomorrow.  The EGD will be done to look for any sign of esophagitis as the cause of his symptoms versus gastric outlet obstruction.  I would also consider a central cause for his nausea vomiting if the EGD is normal.  The patient has Protonix 40 mg IV every 12 hours already ordered.  The patient has been explained the plan and agrees with it.  Thank you for involving me in the care of this patient.      LOS: 3 days   Lucilla Lame, MD  12/06/2019, 11:44 AM Pager [phone] 7am-5pm  Check AMION for 5pm -7am coverage and on weekends   Note: This dictation was prepared with Dragon dictation along with smaller phrase technology. Any transcriptional errors that result from this process are unintentional.

## 2019-12-06 NOTE — Progress Notes (Signed)
Paged NP Jon Billings on call due to review XR ABD results at this time.

## 2019-12-06 NOTE — Progress Notes (Signed)
PROGRESS NOTE    Eugene [name]  ZTI:[id number] DOB: 1960-10-17 DOA: 12/02/2019 PCP: Abner Greenspan, MD      Assessment & Plan:   Active Problems:   Intractable nausea and vomiting   Intractable nausea and vomiting: etiology unclear, possibly secondary to diabetic gastroparesis. Intermittent as pt still c/o nausea & dry heaves again today. Continue on scheduled IV Reglan. Continue w/ scopolamine patch. Continue on IVFs. Continue on PPI. GI consulted  Hypokalemia: KCl repleated. Will continue to monitor   Constipation: S/p disimpaction for constipation. Bowel movement 12/05/19. Continue w/ colace, dulcolax supp. Enema prn   Exposed sutures of suprapubic catheter: suprapubic catheter replaced by IR 12/04/19. Resolved  DM2: uncontrolled with mild DKA. Continue on SSI w/ accuchecks. HbA1c 8.1  Hypertensive urgency: resolved. Continue on amlodipine, lasix & metoprolol. IV labetalol prn.  Peripheral neuropathy: continue on home dose of pregabalin   Obesity: BMI: 32.5. Would benefit from weight loss.  Transaminitis: resolved   DVT prophylaxis: lovenox Code Status: full  Family Communication:  Disposition Plan:  When pt is no longer vomiting and tolerating a po diet; will return to home facility likely   Consultants:  GI   Procedures:    Antimicrobials: n/a   Subjective: Pt c/o nausea & dry heaves still  Objective: Vitals:   12/05/19 0719 12/05/19 0720 12/05/19 1258 12/06/19 0016  BP: (!) 176/171 (!) 176/71 (!) 144/66 (!) 129/55  Pulse: (!) 49 (!) 47 (!) 48 (!) 48  Resp:   15   Temp: 98.9 F (37.2 C)  98.6 F (37 C) 99 F (37.2 C)  TempSrc: Oral  Oral Oral  SpO2:  100% 99% 94%  Weight:      Height:        Intake/Output Summary (Last 24 hours) at 12/06/2019 0715 Last data filed at 12/06/2019 0521 Gross per 24 hour  Intake 1017 ml  Output 2000 ml  Net -983 ml   Filed Weights   12/03/19 0500 12/03/19 1700  Weight: 108.9 kg 108 kg     Examination:  General exam: Appears calm but uncomfortable  Respiratory system: Clear to auscultation. No wheezes, rhonchi Cardiovascular system: S1 & S2 +. No rubs, gallops or clicks. Gastrointestinal system: Abdomen is obese, soft and nontender. Hypoactive bowel sounds heard. Central nervous system: Alert and oriented.  Psychiatry: Judgement and insight appear normal. Flat mood and affect    Data Reviewed: I have personally reviewed following labs and imaging studies  CBC: Recent Labs  Lab 12/02/19 2131 12/03/19 0719 12/04/19 0542 12/05/19 0936 12/06/19 0628  WBC 11.6* 9.2 8.7 9.7 6.3  HGB 13.9 13.1 11.0* 12.2* 12.0*  HCT 41.1 39.3 33.3* 36.6* 35.0*  MCV 91.3 92.5 93.3 91.7 90.4  PLT 210 216 179 178 158   Basic Metabolic Panel: Recent Labs  Lab 12/02/19 2131 12/03/19 0719 12/04/19 0542 12/05/19 0936 12/06/19 0628  NA 138 141 140 140 140  K 4.1 4.5 3.5 3.2* 3.2*  CL 103 106 110 107 108  CO2 19* 20* 23 23 24   GLUCOSE 261* 338* 171* 234* 175*  BUN 21* 22* 22* 13 12  CREATININE 1.07 1.21 0.94 0.97 0.70  CALCIUM 9.3 9.2 8.4* 8.5* 8.6*   GFR: Estimated Creatinine Clearance: 131 mL/min (by C-G formula based on SCr of 0.7 mg/dL). Liver Function Tests: Recent Labs  Lab 12/02/19 2131 12/04/19 0542  AST 29 27  ALT 61* 43  ALKPHOS 102 73  BILITOT 1.2 0.9  PROT 8.1 6.8  ALBUMIN 4.1 3.6  Recent Labs  Lab 12/02/19 2131  LIPASE 14   No results for input(s): AMMONIA in the last 168 hours. Coagulation Profile: No results for input(s): INR, PROTIME in the last 168 hours. Cardiac Enzymes: No results for input(s): CKTOTAL, CKMB, CKMBINDEX, TROPONINI in the last 168 hours. BNP (last 3 results) No results for input(s): PROBNP in the last 8760 hours. HbA1C: Recent Labs    12/03/19 0719  HGBA1C 8.1*   CBG: Recent Labs  Lab 12/03/19 2320 12/04/19 0402 12/04/19 0812 12/04/19 1152 12/04/19 2022  GLUCAP 194* 174* 145* 212* 240*   Lipid Profile: No  results for input(s): CHOL, HDL, LDLCALC, TRIG, CHOLHDL, LDLDIRECT in the last 72 hours. Thyroid Function Tests: No results for input(s): TSH, T4TOTAL, FREET4, T3FREE, THYROIDAB in the last 72 hours. Anemia Panel: No results for input(s): VITAMINB12, FOLATE, FERRITIN, TIBC, IRON, RETICCTPCT in the last 72 hours. Sepsis Labs: No results for input(s): PROCALCITON, LATICACIDVEN in the last 168 hours.  Recent Results (from the past 240 hour(s))  SARS CORONAVIRUS 2 (TAT 6-24 HRS) Nasopharyngeal Nasopharyngeal Swab     Status: None   Collection Time: 12/03/19  3:08 AM   Specimen: Nasopharyngeal Swab  Result Value Ref Range Status   SARS Coronavirus 2 NEGATIVE NEGATIVE Final    Comment: (NOTE) SARS-CoV-2 target nucleic acids are NOT DETECTED. The SARS-CoV-2 RNA is generally detectable in upper and lower respiratory specimens during the acute phase of infection. Negative results do not preclude SARS-CoV-2 infection, do not rule out co-infections with other pathogens, and should not be used as the sole basis for treatment or other patient management decisions. Negative results must be combined with clinical observations, patient history, and epidemiological information. The expected result is Negative. Fact Sheet for Patients: SugarRoll.be Fact Sheet for Healthcare Providers: https://www.woods-mathews.com/ This test is not yet approved or cleared by the [place] FDA and  has been authorized for detection and/or diagnosis of SARS-CoV-2 by FDA under an Emergency Use Authorization (EUA). This EUA will remain  in effect (meaning this test can be used) for the duration of the COVID-19 declaration under Section 56 4(b)(1) of the Act, 21 U.S.C. section 360bbb-3(b)(1), unless the authorization is terminated or revoked sooner. Performed at [facility], [place] [address], [place], [place] [address]   MRSA PCR Screening     Status: Abnormal    Collection Time: 12/03/19 11:03 AM   Specimen: Nasopharyngeal  Result Value Ref Range Status   MRSA by PCR POSITIVE (A) NEGATIVE Final    Comment:        The GeneXpert MRSA Assay (FDA approved for NASAL specimens only), is one component of a comprehensive MRSA colonization surveillance program. It is not intended to diagnose MRSA infection nor to guide or monitor treatment for MRSA infections. RESULT CALLED TO, READ BACK BY AND VERIFIED WITH:  [facility] BIRDWELL AT 1330 12/03/19 SDR Performed at [facility], [address] [place], [place] [address]          Radiology Studies: IR Catheter Tube Change  Result Date: 12/04/2019 INDICATION: History of neurogenic bladder with chronic suprapubic catheter. Patient presents today for fluoroscopic guided exchange conversion of existing 49 French all-purpose drainage catheter to a 16 French balloon retention Council catheter. EXAM: FLUOROSCOPIC GUIDED SUPRAPUBIC CATHETER EXCHANGE COMPARISON:  Image guided suprapubic catheter placement - 10/07/2019 MEDICATIONS: None ANESTHESIA/SEDATION: None CONTRAST:  None FLUOROSCOPY TIME:  30 seconds (5.8 mGy) COMPLICATIONS: None immediate. PROCEDURE: The procedure, risks, benefits, and alternatives were explained to the patient. Questions  regarding the procedure were encouraged and answered. The patient understands and consents to the procedure. A timeout was performed prior to the initiation of the procedure. The external portion of the existing suprapubic catheter as well as the surrounding skin were prepped and draped in usual sterile fashion A preprocedural spot fluoroscopic image was obtained of the lower pelvis and existing 14 French all-purpose drainage catheter with end coiled and locked overlying the expected location of the urinary bladder. Contrast injection confirmed appropriate positioning and functionality existing suprapubic catheter. The external portion of the existing suprapubic  catheter was cut and cannulated with a short Amplatz wire which was coiled within the urinary bladder. Under intermittent fluoroscopic guidance, the track was dilated ultimately allowing placement of a new 16 Fr balloon retention Council catheter. Appropriate positioning was confirmed with the injection of a small amount of contrast as well as the efflux of urine. The catheter was connected to a gravity bag. A dressing was placed. The patient tolerated the procedure well without immediate postprocedural complication. FINDINGS: Preprocedural imaging demonstrates unchanged positioning of all-purpose drainage catheter with end coiled and locked over the expected location of the urinary bladder. Contrast injection confirms appropriate positioning and functionality of the existing percutaneous drainage catheter. After fluoroscopic guided exchange, a new 16 Fr balloon retention Council catheter is appropriately positioned within the urinary bladder. IMPRESSION: Technically successful fluoroscopic guided exchange, upsize and conversion to a 16 [place], balloon retention Council catheter for the purposes of chronic urinary bladder decompression. Electronically Signed   By: Simonne Come M.D.   On: 12/04/2019 15:03   DG Abd Portable 1V  Result Date: 12/05/2019 CLINICAL DATA:  Suprapubic catheter change, nausea and vomiting, last bowel movement 2 days ago EXAM: PORTABLE ABDOMEN - 1 VIEW COMPARISON:  CT abdomen pelvis, 12/02/2019 FINDINGS: Diffusely gas-filled small bowel and colon gas present to the rectum. Moderate burden of stool throughout the colon. Suprapubic catheter. Osseous structures are unremarkable. IMPRESSION: 1. Diffusely gas-filled small bowel and colon with gas present to the rectum. Findings may reflect ileus. Moderate burden of stool throughout the colon. 2.  Suprapubic catheter projects in expected location. Electronically Signed   By: Lauralyn Primes M.D.   On: 12/05/2019 18:47        Scheduled  Meds: . amLODipine  10 mg Oral Daily  . bisacodyl  10 mg Rectal Daily  . docusate sodium  200 mg Oral BID  . enoxaparin (LOVENOX) injection  40 mg Subcutaneous Q24H  . ferrous gluconate  324 mg Oral BID WC  . furosemide  40 mg Oral BID  . hydrALAZINE  50 mg Oral TID  . insulin aspart  0-20 Units Subcutaneous Q4H  . insulin glargine  20 Units Subcutaneous QHS  . linaclotide  290 mcg Oral QAC breakfast  . loratadine  10 mg Oral Daily  . Melatonin  5 mg Oral QHS  . methocarbamol  1,000 mg Oral QID  . metoCLOPramide (REGLAN) injection  10 mg Intravenous Q6H  . metoprolol succinate  25 mg Oral Daily  . nicotine  21 mg Transdermal Daily  . oxyCODONE  10 mg Oral BID  . pantoprazole (PROTONIX) IV  40 mg Intravenous Q12H  . polyethylene glycol  17 g Oral BID  . pregabalin  75 mg Oral BID  . scopolamine  1 patch Transdermal Q72H  . senna-docusate  1 tablet Oral BID  . traZODone  50 mg Oral QHS   Continuous Infusions: . sodium chloride 100 mL/hr at 12/06/19 0619  .  potassium chloride       LOS: 3 days    Time spent: 30 mins     Charise Killian, MD [facility] Pager [phone]  If 7PM-7AM, please contact night-coverage [email or web address] 12/06/2019, 7:15 AM

## 2019-12-07 ENCOUNTER — Encounter: Payer: Self-pay | Admitting: Internal Medicine

## 2019-12-07 ENCOUNTER — Inpatient Hospital Stay: Payer: Medicaid Other | Admitting: Anesthesiology

## 2019-12-07 ENCOUNTER — Encounter
Admission: EM | Disposition: A | Discharge status: Skilled Nursing Facility | Payer: Self-pay | Source: Skilled Nursing Facility | Attending: Internal Medicine

## 2019-12-07 DIAGNOSIS — R112 Nausea with vomiting, unspecified: Secondary | ICD-10-CM

## 2019-12-07 HISTORY — PX: ESOPHAGOGASTRODUODENOSCOPY (EGD) WITH PROPOFOL: SHX5813

## 2019-12-07 LAB — CBC
HCT: 37.2 % — ABNORMAL LOW (ref 39.0–52.0)
Hemoglobin: 12.6 g/dL — ABNORMAL LOW (ref 13.0–17.0)
MCH: 30.2 pg (ref 26.0–34.0)
MCHC: 33.9 g/dL (ref 30.0–36.0)
MCV: 89.2 fL (ref 80.0–100.0)
Platelets: 187 10*3/uL (ref 150–400)
RBC: 4.17 MIL/uL — ABNORMAL LOW (ref 4.22–5.81)
RDW: 12.2 % (ref 11.5–15.5)
WBC: 6.5 10*3/uL (ref 4.0–10.5)
nRBC: 0 % (ref 0.0–0.2)

## 2019-12-07 LAB — GLUCOSE, CAPILLARY
Glucose-Capillary: 153 mg/dL — ABNORMAL HIGH (ref 70–99)
Glucose-Capillary: 155 mg/dL — ABNORMAL HIGH (ref 70–99)
Glucose-Capillary: 166 mg/dL — ABNORMAL HIGH (ref 70–99)
Glucose-Capillary: 170 mg/dL — ABNORMAL HIGH (ref 70–99)
Glucose-Capillary: 171 mg/dL — ABNORMAL HIGH (ref 70–99)
Glucose-Capillary: 173 mg/dL — ABNORMAL HIGH (ref 70–99)
Glucose-Capillary: 179 mg/dL — ABNORMAL HIGH (ref 70–99)
Glucose-Capillary: 183 mg/dL — ABNORMAL HIGH (ref 70–99)
Glucose-Capillary: 188 mg/dL — ABNORMAL HIGH (ref 70–99)
Glucose-Capillary: 189 mg/dL — ABNORMAL HIGH (ref 70–99)
Glucose-Capillary: 207 mg/dL — ABNORMAL HIGH (ref 70–99)
Glucose-Capillary: 173 mg/dL — ABNORMAL HIGH (ref 70–99)
Glucose-Capillary: 180 mg/dL — ABNORMAL HIGH (ref 70–99)
Glucose-Capillary: 179 mg/dL — ABNORMAL HIGH (ref 70–99)
Glucose-Capillary: 165 mg/dL — ABNORMAL HIGH (ref 70–99)
Glucose-Capillary: 261 mg/dL — ABNORMAL HIGH (ref 70–99)
Glucose-Capillary: 263 mg/dL — ABNORMAL HIGH (ref 70–99)

## 2019-12-07 LAB — BASIC METABOLIC PANEL
Anion gap: 9 (ref 5–15)
BUN: 13 mg/dL (ref 6–20)
CO2: 21 mmol/L — ABNORMAL LOW (ref 22–32)
Calcium: 8.4 mg/dL — ABNORMAL LOW (ref 8.9–10.3)
Chloride: 107 mmol/L (ref 98–111)
Creatinine, Ser: 0.73 mg/dL (ref 0.61–1.24)
GFR calc Af Amer: >60 mL/min (ref >60)
GFR calc non Af Amer: >60 mL/min (ref >60)
Glucose, Bld: 196 mg/dL — ABNORMAL HIGH (ref 70–99)
Potassium: 3.4 mmol/L — ABNORMAL LOW (ref 3.5–5.1)
Sodium: 137 mmol/L (ref 135–145)

## 2019-12-07 LAB — IRON AND TIBC
Iron: 67 ug/dL (ref 45–182)
Saturation Ratios: 30 % (ref 17.9–39.5)
TIBC: 227 ug/dL — ABNORMAL LOW (ref 250–450)
UIBC: 160 ug/dL

## 2019-12-07 LAB — FOLATE: Folate: 27 ng/mL (ref >5.9)

## 2019-12-07 LAB — FERRITIN: Ferritin: 76 ng/mL (ref 24–336)

## 2019-12-07 LAB — VITAMIN B12: Vitamin B-12: 267 pg/mL (ref 180–914)

## 2019-12-07 SURGERY — ESOPHAGOGASTRODUODENOSCOPY (EGD) WITH PROPOFOL
Anesthesia: General

## 2019-12-07 MED ORDER — FENTANYL CITRATE (PF) 100 MCG/2ML IJ SOLN
INTRAMUSCULAR | Status: AC
  Filled 2019-12-07: qty 2

## 2019-12-07 MED ORDER — MEPERIDINE HCL 25 MG/ML IJ SOLN
6.2500 mg | INTRAMUSCULAR | Status: DC | PRN
  Filled 2019-12-07: qty 1

## 2019-12-07 MED ORDER — PROMETHAZINE HCL 25 MG/ML IJ SOLN: 6.2500 mg | INTRAMUSCULAR | Status: DC | PRN

## 2019-12-07 MED ORDER — MUPIROCIN 2 % EX OINT
1.0000 "application " | TOPICAL_OINTMENT | Freq: Two times a day (BID) | CUTANEOUS | Status: AC
  Administered 2019-12-08 – 2019-12-12 (×5): 1 via NASAL
  Filled 2019-12-07: qty 22

## 2019-12-07 MED ORDER — FENTANYL CITRATE (PF) 100 MCG/2ML IJ SOLN: 25.0000 ug | INTRAMUSCULAR | Status: DC | PRN

## 2019-12-07 MED ORDER — DEXAMETHASONE SODIUM PHOSPHATE 10 MG/ML IJ SOLN
INTRAMUSCULAR | Status: DC | PRN
  Administered 2019-12-07: 10 mg via INTRAVENOUS

## 2019-12-07 MED ORDER — SODIUM CHLORIDE 0.9 % IV SOLN: INTRAVENOUS | Status: DC

## 2019-12-07 MED ORDER — ONDANSETRON HCL 4 MG/2ML IJ SOLN
INTRAMUSCULAR | Status: AC
  Filled 2019-12-07: qty 2

## 2019-12-07 MED ORDER — SUCCINYLCHOLINE CHLORIDE 200 MG/10ML IV SOSY
PREFILLED_SYRINGE | INTRAVENOUS | Status: AC
  Filled 2019-12-07: qty 10

## 2019-12-07 MED ORDER — CHLORHEXIDINE GLUCONATE CLOTH 2 % EX PADS
6.0000 | MEDICATED_PAD | Freq: Every day | CUTANEOUS | Status: AC
  Administered 2019-12-08 – 2019-12-12 (×2): 6 via TOPICAL

## 2019-12-07 MED ORDER — LIDOCAINE HCL (PF) 2 % IJ SOLN
INTRAMUSCULAR | Status: AC
  Filled 2019-12-07: qty 5

## 2019-12-07 MED ORDER — DEXAMETHASONE SODIUM PHOSPHATE 4 MG/ML IJ SOLN
INTRAMUSCULAR | Status: AC
  Filled 2019-12-07: qty 1

## 2019-12-07 MED ORDER — ONDANSETRON HCL 4 MG/2ML IJ SOLN
INTRAMUSCULAR | Status: DC | PRN
  Administered 2019-12-07: 4 mg via INTRAVENOUS

## 2019-12-07 MED ORDER — PROPOFOL 10 MG/ML IV BOLUS
INTRAVENOUS | Status: DC | PRN
  Administered 2019-12-07: 130 mg via INTRAVENOUS

## 2019-12-07 MED ORDER — POTASSIUM CHLORIDE 10 MEQ/100ML IV SOLN
10.0000 meq | INTRAVENOUS | Status: AC
  Administered 2019-12-07 (×3): 10 meq via INTRAVENOUS
  Filled 2019-12-07 (×3): qty 100

## 2019-12-07 MED ORDER — PROPOFOL 10 MG/ML IV BOLUS
INTRAVENOUS | Status: AC
  Filled 2019-12-07: qty 20

## 2019-12-07 MED ORDER — FENTANYL CITRATE (PF) 100 MCG/2ML IJ SOLN
INTRAMUSCULAR | Status: DC | PRN
  Administered 2019-12-07: 50 ug via INTRAVENOUS

## 2019-12-07 MED ORDER — SUCCINYLCHOLINE CHLORIDE 20 MG/ML IJ SOLN
INTRAMUSCULAR | Status: DC | PRN
  Administered 2019-12-07: 100 mg via INTRAVENOUS

## 2019-12-07 MED ORDER — LIDOCAINE HCL (CARDIAC) PF 100 MG/5ML IV SOSY
PREFILLED_SYRINGE | INTRAVENOUS | Status: DC | PRN
  Administered 2019-12-07: 50 mg via INTRAVENOUS

## 2019-12-07 NOTE — Anesthesia Postprocedure Evaluation (Signed)
Anesthesia Post Note  Patient: Eugene [name]  Procedure(s) Performed: ESOPHAGOGASTRODUODENOSCOPY (EGD) WITH PROPOFOL (N/A )  Patient location during evaluation: PACU Anesthesia Type: General Level of consciousness: awake and alert and oriented Pain management: pain level controlled Vital Signs Assessment: post-procedure vital signs reviewed and stable Respiratory status: spontaneous breathing, nonlabored ventilation and respiratory function stable Cardiovascular status: blood pressure returned to baseline and stable Postop Assessment: no signs of nausea or vomiting Anesthetic complications: no     Last Vitals:  Vitals:   12/07/19 1254 12/07/19 1426  BP: (!) 145/74 (!) 145/60  Pulse: 62 63  Resp: 15 18  Temp: (!) 36.3 C 37.6 C  SpO2: 98% 98%    Last Pain:  Vitals:   12/07/19 1254  TempSrc:   PainSc: 0-No pain                 Deaja Rizo

## 2019-12-07 NOTE — Progress Notes (Signed)
PROGRESS NOTE    Eugene [name]  QQP:[id number] DOB: [birth year] DOA: 12/02/2019 PCP: Abner Greenspan, MD      Assessment & Plan:   Active Problems:   Intractable nausea and vomiting   Intractable nausea and vomiting: etiology unclear, possibly secondary to diabetic gastroparesis. Intermittent as pt still c/o nausea & dry heaves. Continue on scheduled IV Reglan. Continue w/ scopolamine patch. Continue on IVFs. Continue on PPI. EGD today. GI following and recs apprec  Hypokalemia: KCl repleated. Will continue to monitor   Constipation: S/p disimpaction for constipation. Bowel movement 12/05/19. Continue w/ colace, dulcolax supp. Enema prn   Exposed sutures of suprapubic catheter: suprapubic catheter replaced by IR 12/04/19. Resolved  DM2: uncontrolled with mild DKA. Continue on SSI w/ accuchecks. HbA1c 8.1  Hypertensive urgency: resolved. Continue on amlodipine, lasix & metoprolol. IV labetalol prn.  Peripheral neuropathy: continue on home dose of pregabalin   Obesity: BMI: 32.5. Would benefit from weight loss.  Transaminitis: resolved   DVT prophylaxis: lovenox Code Status: full  Family Communication:  Disposition Plan:  When pt is no longer vomiting and tolerating a po diet; will return to home facility likely   Consultants:  GI   Procedures:    Antimicrobials: n/a   Subjective: Pt c/o nausea & dry heaves still. EGD today  Objective: Vitals:   12/06/19 0016 12/06/19 0920 12/06/19 1713 12/07/19 0032  BP: (!) 129/55 (!) 165/91 (!) 183/81 136/68  Pulse: (!) 48 71 60 60  Resp:   20   Temp: 99 F (37.2 C) 98.2 F (36.8 C) 98.6 F (37 C) 98.4 F (36.9 C)  TempSrc: Oral Oral Oral Oral  SpO2: 94% 98% 99% 97%  Weight:      Height:        Intake/Output Summary (Last 24 hours) at 12/07/2019 0718 Last data filed at 12/06/2019 1900 Gross per 24 hour  Intake --  Output 1550 ml  Net -1550 ml   Filed Weights   12/03/19 0500 12/03/19 1700  Weight: 108.9 kg 108  kg    Examination:  General exam: Appears calm but uncomfortable  Respiratory system: Clear to auscultation. No accessory muscle use Cardiovascular system: S1 & S2 +. No rubs, gallops or clicks. Gastrointestinal system: Abdomen is obese, soft and nontender. Hypoactive bowel sounds heard. Central nervous system: Alert and oriented.  Psychiatry: Judgement and insight appear normal. Flat mood and affect    Data Reviewed: I have personally reviewed following labs and imaging studies  CBC: Recent Labs  Lab 12/03/19 0719 12/04/19 0542 12/05/19 0936 12/06/19 0628 12/07/19 0622  WBC 9.2 8.7 9.7 6.3 6.5  HGB 13.1 11.0* 12.2* 12.0* 12.6*  HCT 39.3 33.3* 36.6* 35.0* 37.2*  MCV 92.5 93.3 91.7 90.4 89.2  PLT 216 179 178 158 187   Basic Metabolic Panel: Recent Labs  Lab 12/03/19 0719 12/04/19 0542 12/05/19 0936 12/06/19 0628 12/07/19 0622  NA 141 140 140 140 137  K 4.5 3.5 3.2* 3.2* 3.4*  CL 106 110 107 108 107  CO2 20* 23 23 24  21*  GLUCOSE 338* 171* 234* 175* 196*  BUN 22* 22* 13 12 13   CREATININE 1.21 0.94 0.97 0.70 0.73  CALCIUM 9.2 8.4* 8.5* 8.6* 8.4*  MG  --   --   --  2.0  --    GFR: Estimated Creatinine Clearance: 131 mL/min (by C-G formula based on SCr of 0.73 mg/dL). Liver Function Tests: Recent Labs  Lab 12/02/19 2131 12/04/19 0542  AST 29 27  ALT 61*  43  ALKPHOS 102 73  BILITOT 1.2 0.9  PROT 8.1 6.8  ALBUMIN 4.1 3.6   Recent Labs  Lab 12/02/19 2131  LIPASE 14   No results for input(s): AMMONIA in the last 168 hours. Coagulation Profile: No results for input(s): INR, PROTIME in the last 168 hours. Cardiac Enzymes: No results for input(s): CKTOTAL, CKMB, CKMBINDEX, TROPONINI in the last 168 hours. BNP (last 3 results) No results for input(s): PROBNP in the last 8760 hours. HbA1C: No results for input(s): HGBA1C in the last 72 hours. CBG: Recent Labs  Lab 12/03/19 2320 12/04/19 0402 12/04/19 0812 12/04/19 1152 12/04/19 2022  GLUCAP 194*  174* 145* 212* 240*   Lipid Profile: No results for input(s): CHOL, HDL, LDLCALC, TRIG, CHOLHDL, LDLDIRECT in the last 72 hours. Thyroid Function Tests: No results for input(s): TSH, T4TOTAL, FREET4, T3FREE, THYROIDAB in the last 72 hours. Anemia Panel: No results for input(s): VITAMINB12, FOLATE, FERRITIN, TIBC, IRON, RETICCTPCT in the last 72 hours. Sepsis Labs: No results for input(s): PROCALCITON, LATICACIDVEN in the last 168 hours.  Recent Results (from the past 240 hour(s))  SARS CORONAVIRUS 2 (TAT 6-24 HRS) Nasopharyngeal Nasopharyngeal Swab     Status: None   Collection Time: 12/03/19  3:08 AM   Specimen: Nasopharyngeal Swab  Result Value Ref Range Status   SARS Coronavirus 2 NEGATIVE NEGATIVE Final    Comment: (NOTE) SARS-CoV-2 target nucleic acids are NOT DETECTED. The SARS-CoV-2 RNA is generally detectable in upper and lower respiratory specimens during the acute phase of infection. Negative results do not preclude SARS-CoV-2 infection, do not rule out co-infections with other pathogens, and should not be used as the sole basis for treatment or other patient management decisions. Negative results must be combined with clinical observations, patient history, and epidemiological information. The expected result is Negative. Fact Sheet for Patients: HairSlick.no Fact Sheet for Healthcare Providers: quierodirigir.com This test is not yet approved or cleared by the [place] FDA and  has been authorized for detection and/or diagnosis of SARS-CoV-2 by FDA under an Emergency Use Authorization (EUA). This EUA will remain  in effect (meaning this test can be used) for the duration of the COVID-19 declaration under Section 56 4(b)(1) of the Act, 21 U.S.C. section 360bbb-3(b)(1), unless the authorization is terminated or revoked sooner. Performed at [facility], [address], [place], [place] [address]   MRSA  PCR Screening     Status: Abnormal   Collection Time: 12/03/19 11:03 AM   Specimen: Nasopharyngeal  Result Value Ref Range Status   MRSA by PCR POSITIVE (A) NEGATIVE Final    Comment:        The GeneXpert MRSA Assay (FDA approved for NASAL specimens only), is one component of a comprehensive MRSA colonization surveillance program. It is not intended to diagnose MRSA infection nor to guide or monitor treatment for MRSA infections. RESULT CALLED TO, READ BACK BY AND VERIFIED WITH:  [facility] BIRDWELL AT 1330 12/03/19 SDR Performed at [facility], [address] [place], [place] [address]          Radiology Studies: DG Abd Portable 1V  Result Date: 12/05/2019 CLINICAL DATA:  Suprapubic catheter change, nausea and vomiting, last bowel movement 2 days ago EXAM: PORTABLE ABDOMEN - 1 VIEW COMPARISON:  CT abdomen pelvis, 12/02/2019 FINDINGS: Diffusely gas-filled small bowel and colon gas present to the rectum. Moderate burden of stool throughout the colon. Suprapubic catheter. Osseous structures are unremarkable. IMPRESSION: 1. Diffusely gas-filled small bowel and colon  with gas present to the rectum. Findings may reflect ileus. Moderate burden of stool throughout the colon. 2.  Suprapubic catheter projects in expected location. Electronically Signed   By: Eddie Candle M.D.   On: 12/05/2019 18:47        Scheduled Meds: . amLODipine  10 mg Oral Daily  . bisacodyl  10 mg Rectal Daily  . docusate sodium  200 mg Oral BID  . enoxaparin (LOVENOX) injection  40 mg Subcutaneous Q24H  . ferrous gluconate  324 mg Oral BID WC  . furosemide  40 mg Oral BID  . hydrALAZINE  50 mg Oral TID  . insulin aspart  0-20 Units Subcutaneous Q4H  . insulin glargine  20 Units Subcutaneous QHS  . linaclotide  290 mcg Oral QAC breakfast  . loratadine  10 mg Oral Daily  . Melatonin  5 mg Oral QHS  . methocarbamol  1,000 mg Oral QID  . metoCLOPramide (REGLAN) injection  10 mg Intravenous Q6H  .  metoprolol succinate  25 mg Oral Daily  . nicotine  21 mg Transdermal Daily  . oxyCODONE  10 mg Oral BID  . pantoprazole (PROTONIX) IV  40 mg Intravenous Q12H  . polyethylene glycol  17 g Oral BID  . pregabalin  75 mg Oral BID  . scopolamine  1 patch Transdermal Q72H  . senna-docusate  1 tablet Oral BID  . sodium chloride flush  5 mL Intracatheter Q8H  . traZODone  50 mg Oral QHS   Continuous Infusions: . sodium chloride 100 mL/hr at 12/07/19 0116  . potassium chloride       LOS: 4 days    Time spent: 30 mins     Wyvonnia Dusky, MD [facility] Pager [phone]  If 7PM-7AM, please contact night-coverage [email or web address] 12/07/2019, 7:18 AM

## 2019-12-07 NOTE — Anesthesia Procedure Notes (Signed)
Procedure Name: Intubation Date/Time: 12/07/2019 12:01 PM Performed by: Ginger Carne, CRNA Pre-anesthesia Checklist: Patient identified, Emergency Drugs available, Suction available, Patient being monitored and Timeout performed Patient Re-evaluated:Patient Re-evaluated prior to induction Oxygen Delivery Method: Circle system utilized Preoxygenation: Pre-oxygenation with 100% oxygen Induction Type: IV induction, Rapid sequence and Cricoid Pressure applied Laryngoscope Size: McGraph and 3 Grade View: Grade I Tube type: Oral Tube size: 7.5 mm Number of attempts: 1 Airway Equipment and Method: Stylet and Video-laryngoscopy Placement Confirmation: ETT inserted through vocal cords under direct vision,  positive ETCO2 and breath sounds checked- equal and bilateral Secured at: 21 cm Tube secured with: Tape Dental Injury: Teeth and Oropharynx as per pre-operative assessment

## 2019-12-07 NOTE — Op Note (Signed)
[facility]ology Patient Name: Eugene [name] Procedure Date: 12/07/2019 11:52 AM MRN: [id number] Account #: [id number] Date of Birth: 11-23-1960 Admit Type: Outpatient Age: 59 Room: [facility] ENDO ROOM 1 Gender: Male Note Status: Finalized Procedure:             Upper GI endoscopy Indications:           Nausea with vomiting, Persistent vomiting Providers:             Toney Reil MD, MD Medicines:             Monitored Anesthesia Care, General Anesthesia Complications:         No immediate complications. Estimated blood loss: None. Procedure:             Pre-Anesthesia Assessment:                        - Prior to the procedure, a History and Physical was                         performed, and patient medications and allergies were                         reviewed. The patient is competent. The risks and                         benefits of the procedure and the sedation options and                         risks were discussed with the patient. All questions                         were answered and informed consent was obtained.                         Patient identification and proposed procedure were                         verified by the physician, the nurse, the                         anesthesiologist, the anesthetist and the technician                         in the pre-procedure area in the procedure room in the                         endoscopy suite. Mental Status Examination: alert and                         oriented. Airway Examination: normal oropharyngeal                         airway and neck mobility. Respiratory Examination:                         clear to auscultation. CV Examination: normal.                         Prophylactic Antibiotics:  The patient does not require                         prophylactic antibiotics. Prior Anticoagulants: The                         patient has taken no previous anticoagulant or          antiplatelet agents. ASA Grade Assessment: III - A                         patient with severe systemic disease. After reviewing                         the risks and benefits, the patient was deemed in                         satisfactory condition to undergo the procedure. The                         anesthesia plan was to use general anesthesia.                         Immediately prior to administration of medications,                         the patient was re-assessed for adequacy to receive                         sedatives. The heart rate, respiratory rate, oxygen                         saturations, blood pressure, adequacy of pulmonary                         ventilation, and response to care were monitored                         throughout the procedure. The physical status of the                         patient was re-assessed after the procedure.                        After obtaining informed consent, the endoscope was                         passed under direct vision. Throughout the procedure,                         the patient's blood pressure, pulse, and oxygen                         saturations were monitored continuously. The Endoscope                         was introduced through the mouth, and advanced to the  second part of duodenum. The upper GI endoscopy was                         accomplished without difficulty. The patient tolerated                         the procedure well. Findings:      The duodenal bulb and second portion of the duodenum were normal.      The entire examined stomach was normal. Biopsies were taken with a cold       forceps for Helicobacter pylori testing.      The cardia and gastric fundus were normal on retroflexion.      LA Grade B (one or more mucosal breaks greater than 5 mm, not extending       between the tops of two mucosal folds) esophagitis with no bleeding was       found in the lower third of the  esophagus. Impression:            - Normal duodenal bulb and second portion of the                         duodenum.                        - Normal stomach. Biopsied.                        - LA Grade B reflux esophagitis with no bleeding. Recommendation:        - Return patient to hospital ward for ongoing care.                        - Advance diet as tolerated today.                        - Continue present medications.                        - Follow an antireflux regimen today.                        - Use a proton pump inhibitor PO BID for 8 weeks.                        - Return to GI office in 1 month. Procedure Code(s):     --- Professional ---                        [phone], Esophagogastroduodenoscopy, flexible,                         transoral; with biopsy, single or multiple Diagnosis Code(s):     --- Professional ---                        K21.00                        R11.15 CPT copyright 2019 American Medical Association. All rights reserved. The codes documented in this report are preliminary and upon coder review may  be revised to meet current compliance  requirements. Dr. Libby Maw Toney Reil MD, MD 12/07/2019 12:13:38 PM This report has been signed electronically. Number of Addenda: 0 Note Initiated On: 12/07/2019 11:52 AM Estimated Blood Loss:  Estimated blood loss: none.      [facility]

## 2019-12-07 NOTE — Transfer of Care (Signed)
Immediate Anesthesia Transfer of Care Note  Patient: Eugene [name]  Procedure(s) Performed: ESOPHAGOGASTRODUODENOSCOPY (EGD) WITH PROPOFOL (N/A )  Patient Location: PACU  Anesthesia Type:General  Level of Consciousness: awake, alert  and oriented  Airway & Oxygen Therapy: Patient Spontanous Breathing and Patient connected to face mask oxygen  Post-op Assessment: Report given to RN and Post -op Vital signs reviewed and stable  Post vital signs: Reviewed and stable  Last Vitals:  Vitals Value Taken Time  BP 148/87 12/07/19 1225  Temp    Pulse 73 12/07/19 1225  Resp 16 12/07/19 1225  SpO2 100 % 12/07/19 1225  Vitals shown include unvalidated device data.  Last Pain:  Vitals:   12/07/19 1043  TempSrc: Oral  PainSc: 8          Complications: No apparent anesthesia complications

## 2019-12-07 NOTE — Anesthesia Preprocedure Evaluation (Signed)
Anesthesia Evaluation  Patient identified by MRN, date of birth, ID band Patient awake    Reviewed: Allergy & Precautions, NPO status , Patient's Chart, lab work & pertinent test results  History of Anesthesia Complications Negative for: history of anesthetic complications  Airway Mallampati: III  TM Distance: >3 FB Neck ROM: Full    Dental  (+) Edentulous Upper, Edentulous Lower   Pulmonary neg sleep apnea, neg COPD, Current SmokerPatient did not abstain from smoking.,    breath sounds clear to auscultation- rhonchi (-) wheezing      Cardiovascular hypertension, + Peripheral Vascular Disease  (-) CAD, (-) Past MI, (-) Cardiac Stents and (-) CABG  Rhythm:Regular Rate:Normal - Systolic murmurs and - Diastolic murmurs    Neuro/Psych neg Seizures PSYCHIATRIC DISORDERS Anxiety Depression negative neurological ROS     GI/Hepatic GERD  ,(+) Hepatitis -, C  Endo/Other  diabetes, Insulin Dependent  Renal/GU negative Renal ROS     Musculoskeletal negative musculoskeletal ROS (+)   Abdominal (+) + obese,   Peds  Hematology  (+) anemia ,   Anesthesia Other Findings Past Medical History: No date: Anxiety No date: Depression No date: Diabetes mellitus without complication (HCC) No date: Hepatitis C No date: Hyperlipidemia No date: IBS (irritable bowel syndrome) No date: Osteomyelitis (HCC) No date: Spinal stenosis   Reproductive/Obstetrics                             Anesthesia Physical Anesthesia Plan  ASA: III  Anesthesia Plan: General   Post-op Pain Management:    Induction: Intravenous  PONV Risk Score and Plan: 0 and Ondansetron  Airway Management Planned: Oral ETT  Additional Equipment:   Intra-op Plan:   Post-operative Plan: Extubation in OR  Informed Consent: I have reviewed the patients History and Physical, chart, labs and discussed the procedure including the risks,  benefits and alternatives for the proposed anesthesia with the patient or authorized representative who has indicated his/her understanding and acceptance.     Dental advisory given  Plan Discussed with: CRNA and Anesthesiologist  Anesthesia Plan Comments:         Anesthesia Quick Evaluation

## 2019-12-07 NOTE — Progress Notes (Signed)
Writer left patient's room to grab a couple of medications, upon returning patient seen laying in bed on his right side with back to the door, patient was seen sticking his finger down his throat, appearing to attempt to make himself vomit. Asked patient what he was doing and he stated he was trying to get something out of his mouth.  Orson Ape, BSN

## 2019-12-08 ENCOUNTER — Encounter: Payer: Self-pay | Admitting: *Deleted

## 2019-12-08 LAB — BASIC METABOLIC PANEL
Anion gap: 7 (ref 5–15)
BUN: 10 mg/dL (ref 6–20)
CO2: 23 mmol/L (ref 22–32)
Calcium: 8.3 mg/dL — ABNORMAL LOW (ref 8.9–10.3)
Chloride: 110 mmol/L (ref 98–111)
Creatinine, Ser: 0.74 mg/dL (ref 0.61–1.24)
GFR calc Af Amer: >60 mL/min (ref >60)
GFR calc non Af Amer: >60 mL/min (ref >60)
Glucose, Bld: 159 mg/dL — ABNORMAL HIGH (ref 70–99)
Potassium: 3.1 mmol/L — ABNORMAL LOW (ref 3.5–5.1)
Sodium: 140 mmol/L (ref 135–145)

## 2019-12-08 LAB — GLUCOSE, CAPILLARY
Glucose-Capillary: 146 mg/dL — ABNORMAL HIGH (ref 70–99)
Glucose-Capillary: 170 mg/dL — ABNORMAL HIGH (ref 70–99)
Glucose-Capillary: 171 mg/dL — ABNORMAL HIGH (ref 70–99)
Glucose-Capillary: 173 mg/dL — ABNORMAL HIGH (ref 70–99)
Glucose-Capillary: 180 mg/dL — ABNORMAL HIGH (ref 70–99)
Glucose-Capillary: 192 mg/dL — ABNORMAL HIGH (ref 70–99)

## 2019-12-08 LAB — CBC
HCT: 36.6 % — ABNORMAL LOW (ref 39.0–52.0)
Hemoglobin: 12.8 g/dL — ABNORMAL LOW (ref 13.0–17.0)
MCH: 31.1 pg (ref 26.0–34.0)
MCHC: 35 g/dL (ref 30.0–36.0)
MCV: 88.8 fL (ref 80.0–100.0)
Platelets: 192 10*3/uL (ref 150–400)
RBC: 4.12 MIL/uL — ABNORMAL LOW (ref 4.22–5.81)
RDW: 12.2 % (ref 11.5–15.5)
WBC: 7.1 10*3/uL (ref 4.0–10.5)
nRBC: 0 % (ref 0.0–0.2)

## 2019-12-08 LAB — SURGICAL PATHOLOGY

## 2019-12-08 MED ORDER — POTASSIUM CHLORIDE 10 MEQ/100ML IV SOLN
10.0000 meq | INTRAVENOUS | Status: AC
  Administered 2019-12-08 (×5): 10 meq via INTRAVENOUS
  Filled 2019-12-08 (×3): qty 100

## 2019-12-08 MED ORDER — ERYTHROMYCIN BASE 250 MG PO TBEC
250.0000 mg | DELAYED_RELEASE_TABLET | Freq: Three times a day (TID) | ORAL | Status: DC
  Administered 2019-12-10 – 2019-12-14 (×12): 250 mg via ORAL
  Filled 2019-12-08 (×22): qty 1

## 2019-12-08 NOTE — TOC Progression Note (Signed)
Transition of Care [facility]) - Progression Note    Patient Details  Name: Eugene [name] MRN: [id number] Date of Birth: September 21, 1961  [facility] CM/SW Contact  Allayne Butcher, RN Phone Number: 12/08/2019, 3:34 PM  Clinical Narrative:    Discharge plan remains for patient to go to [facility] when medically stable.  Patient continues to report uncontrolled nausea and vomiting, unable to tolerate a diet.     Expected Discharge Plan: Skilled Nursing Facility Barriers to Discharge: Continued Medical Work up  Expected Discharge Plan and Services Expected Discharge Plan: Skilled Nursing Facility   Discharge Planning Services: CM Consult Post Acute Care Choice: Skilled Nursing Facility Living arrangements for the past 2 months: Skilled Nursing Facility                                       Social Determinants of Health (SDOH) Interventions    Readmission Risk Interventions Readmission Risk Prevention Plan 10/05/2019 03/04/2019  Transportation Screening Complete Complete  Medication Review [name]) Complete Complete  PCP or Specialist appointment within 3-5 days of discharge Complete -  HRI or Home Care Consult Complete -  SW Recovery Care/Counseling Consult Complete -  Palliative Care Screening Not Applicable Not Applicable  Skilled Nursing Facility Complete Complete

## 2019-12-08 NOTE — Progress Notes (Signed)
PROGRESS NOTE    Eugene [name]  BJY:[id number] DOB: [birth year] DOA: 12/02/2019 PCP: Abner Greenspan, MD    HPI was taken from Dr. Audelia Acton [name]  is a 59 y.o. Caucasian male who is a resident of [facility], with a known history of type II diabetes mellitus, dyslipidemia, anxiety and depression as well as hepatitis C and irritable bowel syndrome, who presented to the emergency room with acute onset of intractable nausea and vomiting with associated epigastric and periumbilical abdominal pain since 2 PM yesterday.  He admitted to constipation.  He has been having chills without tactile fever.  He denied any bilious vomitus or hematemesis or diarrhea.  No chest pain, dyspnea or cough or wheezing.  No exposure to COVID-19.  He has a suprapubic catheter and has not noticed any change in urine color or smell.  Upon presentation to the emergency room, blood pressure was 199/95 with respirate of 21 and otherwise normal vital signs.  Labs revealed blood glucose of 261 and a BUN of 21 with creatinine 1.07 with anion gap of 16 and CO2 of 19.  CBC showed no leukocytosis of 11.6.  COVID-19 PCR is currently pending.  Abdominal pelvic CT scan revealed progressed to distal fecal impaction since January with associated acute proctitis/stercoral colitis as well as redundant large bowel and substantial retained stool also on the left colon with no dilated small bowel.  It showed a stable suprapubic catheter and decompressed urinary bladder.  The patient was given hydration with IV normal saline, 4 mg of IV morphine sulfate, 10 mg of IV Reglan, 12.5 mg of IV Phenergan, 290 mcg of p.o. Linzess, 20 g of p.o. lactulose and smog enema in addition to disimpaction with significant amount of stools.  He continued to have nausea and vomiting however.  Will therefore be admitted to an observation medical bed for further evaluation and management.  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 S/p EGD 12/07/19 which showed esophagitis, biopsies were taken for testing for H. Pylori were done & path is pending. Intermittent as pt still c/o nausea & dry heaves. Continue on scheduled IV Reglan. Continue w/ scopolamine patch. Started on erythromycin for possible gastroparesis as nothing else has relieved the pt's symptoms. Continue on IVFs. Continue on PPI.  GI following and recs apprec  Hypokalemia: KCl repleated again. Will continue to monitor   Constipation: S/p disimpaction for constipation. Bowel movement 12/05/19. Continue w/ colace, dulcolax supp. Enema prn   Exposed sutures of suprapubic catheter: suprapubic catheter replaced by IR 12/04/19. Resolved  DM2: uncontrolled with mild DKA. Continue on SSI w/ accuchecks. HbA1c 8.1  Hypertensive urgency: resolved. Continue on amlodipine, lasix & metoprolol. IV labetalol prn.  Peripheral neuropathy: continue on home dose of pregabalin   Obesity: BMI: 32.5. Would benefit from weight loss.  Transaminitis: resolved   DVT prophylaxis: lovenox Code Status: full  Family Communication:  Disposition Plan:  When pt is no longer vomiting and tolerating a po diet; will return to home facility likely   Consultants:  GI   Procedures:    Antimicrobials: erythromycin for possible gastroparesis    Subjective: Pt c/o nausea and dry heaves  Objective: Vitals:   12/07/19 1239 12/07/19 1254 12/07/19 1426 12/07/19 2355  BP: (!) 144/80 (!) 145/74 (!) 145/60 135/67  Pulse: 69 62 63 (!) 59  Resp: 17 15 18 18   Temp:  )  97.3 F (36.3 C) 99.7 F (37.6 C) 98.9 F (37.2 C)  TempSrc:    Oral  SpO2: 96% 98% 98% 97%  Weight:      Height:        Intake/Output Summary (Last 24 hours) at 12/08/2019 0733 Last data filed at 12/08/2019 0530 Gross per 24 hour  Intake 3764.65 ml  Output 1350 ml  Net 2414.65 ml     Filed Weights   12/03/19 0500 12/03/19 1700  Weight: 108.9 kg 108 kg    Examination:  General exam: Appears agitated  Respiratory system: Clear to auscultation. No wheezes, rales Cardiovascular system: S1 & S2 +. No rubs, gallops or clicks. Gastrointestinal system: Abdomen is obese, soft and nontender. Hypoactive bowel sounds heard. Central nervous system: Alert and oriented.  Psychiatry: Judgement and insight appear normal. Agitated and frustrated    Data Reviewed: I have personally reviewed following labs and imaging studies  CBC: Recent Labs  Lab 12/04/19 0542 12/05/19 0936 12/06/19 0628 12/07/19 0622 12/08/19 0611  WBC 8.7 9.7 6.3 6.5 7.1  HGB 11.0* 12.2* 12.0* 12.6* 12.8*  HCT 33.3* 36.6* 35.0* 37.2* 36.6*  MCV 93.3 91.7 90.4 89.2 88.8  PLT 179 178 158 187 192   Basic Metabolic Panel: Recent Labs  Lab 12/04/19 0542 12/05/19 0936 12/06/19 0628 12/07/19 0622 12/08/19 0611  NA 140 140 140 137 140  K 3.5 3.2* 3.2* 3.4* 3.1*  CL 110 107 108 107 110  CO2 23 23 24  21* 23  GLUCOSE 171* 234* 175* 196* 159*  BUN 22* 13 12 13 10   CREATININE 0.94 0.97 0.70 0.73 0.74  CALCIUM 8.4* 8.5* 8.6* 8.4* 8.3*  MG  --   --  2.0  --   --    GFR: Estimated Creatinine Clearance: 131 mL/min (by C-G formula based on SCr of 0.74 mg/dL). Liver Function Tests: Recent Labs  Lab 12/02/19 2131 12/04/19 0542  AST 29 27  ALT 61* 43  ALKPHOS 102 73  BILITOT 1.2 0.9  PROT 8.1 6.8  ALBUMIN 4.1 3.6   Recent Labs  Lab 12/02/19 2131  LIPASE 14   No results for input(s): AMMONIA in the last 168 hours. Coagulation Profile: No results for input(s): INR, PROTIME in the last 168 hours. Cardiac Enzymes: No results for input(s): CKTOTAL, CKMB, CKMBINDEX, TROPONINI in the last 168 hours. BNP (last 3 results) No results for input(s): PROBNP in the last 8760 hours. HbA1C: No results for input(s): HGBA1C in the last 72 hours. CBG: Recent Labs  Lab 12/07/19 1233 12/07/19 1652  12/07/19 2017 12/08/19 0029 12/08/19 0316  GLUCAP 165* 261* 263* 171* 173*   Lipid Profile: No results for input(s): CHOL, HDL, LDLCALC, TRIG, CHOLHDL, LDLDIRECT in the last 72 hours. Thyroid Function Tests: No results for input(s): TSH, T4TOTAL, FREET4, T3FREE, THYROIDAB in the last 72 hours. Anemia Panel: Recent Labs    12/07/19 1311  VITAMINB12 267  FOLATE 27.0  FERRITIN 76  TIBC 227*  IRON 67   Sepsis Labs: No results for input(s): PROCALCITON, LATICACIDVEN in the last 168 hours.  Recent Results (from the past 240 hour(s))  SARS CORONAVIRUS 2 (TAT 6-24 HRS) Nasopharyngeal Nasopharyngeal Swab     Status: None   Collection Time: 12/03/19  3:08 AM   Specimen: Nasopharyngeal Swab  Result Value Ref Range Status   SARS Coronavirus 2 NEGATIVE NEGATIVE Final    Comment: (NOTE) SARS-CoV-2 target nucleic acids are NOT DETECTED. The SARS-CoV-2 RNA is generally detectable in upper and lower respiratory specimens  during the acute phase of infection. Negative results do not preclude SARS-CoV-2 infection, do not rule out co-infections with other pathogens, and should not be used as the sole basis for treatment or other patient management decisions. Negative results must be combined with clinical observations, patient history, and epidemiological information. The expected result is Negative. Fact Sheet for Patients: SugarRoll.be Fact Sheet for Healthcare Providers: https://www.woods-mathews.com/ This test is not yet approved or cleared by the [place] FDA and  has been authorized for detection and/or diagnosis of SARS-CoV-2 by FDA under an Emergency Use Authorization (EUA). This EUA will remain  in effect (meaning this test can be used) for the duration of the COVID-19 declaration under Section 56 4(b)(1) of the Act, 21 U.S.C. section 360bbb-3(b)(1), unless the authorization is terminated or revoked sooner. Performed at [facility], [place] [address] [place], [place] [address]   MRSA PCR Screening     Status: Abnormal   Collection Time: 12/03/19 11:03 AM   Specimen: Nasopharyngeal  Result Value Ref Range Status   MRSA by PCR POSITIVE (A) NEGATIVE Final    Comment:        The GeneXpert MRSA Assay (FDA approved for NASAL specimens only), is one component of a comprehensive MRSA colonization surveillance program. It is not intended to diagnose MRSA infection nor to guide or monitor treatment for MRSA infections. RESULT CALLED TO, READ BACK BY AND VERIFIED WITH:  [facility] BIRDWELL AT 1330 12/03/19 SDR Performed at [facility], [address] [place], [place] [address]          Radiology Studies: No results found.      Scheduled Meds: . amLODipine  10 mg Oral Daily  . bisacodyl  10 mg Rectal Daily  . Chlorhexidine Gluconate Cloth  6 each Topical Q0600  . docusate sodium  200 mg Oral BID  . enoxaparin (LOVENOX) injection  40 mg Subcutaneous Q24H  . ferrous gluconate  324 mg Oral BID WC  . furosemide  40 mg Oral BID  . hydrALAZINE  50 mg Oral TID  . insulin aspart  0-20 Units Subcutaneous Q4H  . insulin glargine  20 Units Subcutaneous QHS  . linaclotide  290 mcg Oral QAC breakfast  . loratadine  10 mg Oral Daily  . Melatonin  5 mg Oral QHS  . methocarbamol  1,000 mg Oral QID  . metoCLOPramide (REGLAN) injection  10 mg Intravenous Q6H  . metoprolol succinate  25 mg Oral Daily  . mupirocin ointment  1 application Nasal BID  . nicotine  21 mg Transdermal Daily  . oxyCODONE  10 mg Oral BID  . pantoprazole (PROTONIX) IV  40 mg Intravenous Q12H  . polyethylene glycol  17 g Oral BID  . pregabalin  75 mg Oral BID  . scopolamine  1 patch Transdermal Q72H  . senna-docusate  1 tablet Oral BID  . sodium chloride flush  5 mL Intracatheter Q8H  . traZODone  50 mg Oral QHS   Continuous Infusions: . sodium chloride 100 mL/hr at 12/08/19 0400  . potassium chloride       LOS: 5 days      Time spent: 30 mins     Wyvonnia Dusky, MD [facility] Pager [phone]  If 7PM-7AM, please contact night-coverage [email or web address] 12/08/2019, 7:33 AM

## 2019-12-08 NOTE — Progress Notes (Signed)
Eugene Darby, MD [address]  [place]  [place], [place] [address]  Main: [phone]  Fax: [phone] Pager: [phone]   Subjective: Patient reports ongoing nausea, dry heaving and unable to tolerate liquids.  He is also refusing p.o. medication.  He denies abdominal pain  Objective: Vital signs in last 24 hours: Vitals:   12/07/19 1254 12/07/19 1426 12/07/19 2355 12/08/19 0841  BP: (!) 145/74 (!) 145/60 135/67 (!) 158/64  Pulse: 62 63 (!) 59 62  Resp: 15 18 18 18   Temp: (!) 97.3 F (36.3 C) 99.7 F (37.6 C) 98.9 F (37.2 C) 98 F (36.7 C)  TempSrc:   Oral Axillary  SpO2: 98% 98% 97% 99%  Weight:      Height:       Weight change:   Intake/Output Summary (Last 24 hours) at 12/08/2019 1450 Last data filed at 12/08/2019 0705 Gross per 24 hour  Intake 703.3 ml  Output 800 ml  Net -96.7 ml     Exam: Heart:: Regular rate and rhythm, S1S2 present or without murmur or extra heart sounds Lungs: normal and clear to auscultation Abdomen: soft, nontender, normal bowel sounds   Lab Results: CBC Latest Ref Rng & Units 12/08/2019 12/07/2019 12/06/2019  WBC 4.0 - 10.5 K/uL 7.1 6.5 6.3  Hemoglobin 13.0 - 17.0 g/dL 12.8(L) 12.6(L) 12.0(L)  Hematocrit 39.0 - 52.0 % 36.6(L) 37.2(L) 35.0(L)  Platelets 150 - 400 K/uL 192 187 158   CMP Latest Ref Rng & Units 12/08/2019 12/07/2019 12/06/2019  Glucose 70 - 99 mg/dL 159(H) 196(H) 175(H)  BUN 6 - 20 mg/dL 10 13 12   Creatinine 0.61 - 1.24 mg/dL 0.74 0.73 0.70  Sodium 135 - 145 mmol/L 140 137 140  Potassium 3.5 - 5.1 mmol/L 3.1(L) 3.4(L) 3.2(L)  Chloride 98 - 111 mmol/L 110 107 108  CO2 22 - 32 mmol/L 23 21(L) 24  Calcium 8.9 - 10.3 mg/dL 8.3(L) 8.4(L) 8.6(L)  Total Protein 6.5 - 8.1 g/dL - - -  Total Bilirubin 0.3 - 1.2 mg/dL - - -  Alkaline Phos 38 - 126 U/L - - -  AST 15 - 41 U/L - - -  ALT 0 - 44 U/L - - -    Micro Results: Recent Results (from the past 240 hour(s))  SARS CORONAVIRUS 2 (TAT 6-24 HRS)  Nasopharyngeal Nasopharyngeal Swab     Status: None   Collection Time: 12/03/19  3:08 AM   Specimen: Nasopharyngeal Swab  Result Value Ref Range Status   SARS Coronavirus 2 NEGATIVE NEGATIVE Final    Comment: (NOTE) SARS-CoV-2 target nucleic acids are NOT DETECTED. The SARS-CoV-2 RNA is generally detectable in upper and lower respiratory specimens during the acute phase of infection. Negative results do not preclude SARS-CoV-2 infection, do not rule out co-infections with other pathogens, and should not be used as the sole basis for treatment or other patient management decisions. Negative results must be combined with clinical observations, patient history, and epidemiological information. The expected result is Negative. Fact Sheet for Patients: SugarRoll.be Fact Sheet for Healthcare Providers: https://www.woods-mathews.com/ This test is not yet approved or cleared by the [place] FDA and  has been authorized for detection and/or diagnosis of SARS-CoV-2 by FDA under an Emergency Use Authorization (EUA). This EUA will remain  in effect (meaning this test can be used) for the duration of the COVID-19 declaration under Section 56 4(b)(1) of the Act, 21 U.S.C. section 360bbb-3(b)(1), unless the authorization is terminated or revoked sooner. Performed  at [facility], [address] [place], [place] [address]   MRSA PCR Screening     Status: Abnormal   Collection Time: 12/03/19 11:03 AM   Specimen: Nasopharyngeal  Result Value Ref Range Status   MRSA by PCR POSITIVE (A) NEGATIVE Final    Comment:        The GeneXpert MRSA Assay (FDA approved for NASAL specimens only), is one component of a comprehensive MRSA colonization surveillance program. It is not intended to diagnose MRSA infection nor to guide or monitor treatment for MRSA infections. RESULT CALLED TO, READ BACK BY AND VERIFIED WITH:  [facility] BIRDWELL AT 1330  12/03/19 SDR Performed at [facility], [address] [place], [place] [address]    Studies/Results: No results found. Medications:  I have reviewed the patient's current medications. Prior to Admission:  Medications Prior to Admission  Medication Sig Dispense Refill Last Dose  . acetaminophen (TYLENOL) 325 MG tablet Take 650 mg by mouth every 4 (four) hours as needed for mild pain or fever.   prn at prn  . amLODipine (NORVASC) 10 MG tablet Take 10 mg by mouth daily.   unknown at unknown  . bisacodyl (DULCOLAX) 10 MG suppository Place 10 mg rectally daily as needed for moderate constipation.   prn at prn  . famotidine (PEPCID) 20 MG tablet Take 20 mg by mouth daily.    unknown at unknown  . ferrous gluconate (FERGON) 324 MG tablet Take 324 mg by mouth 2 (two) times daily with a meal.    unknown at unknown  . furosemide (LASIX) 40 MG tablet Take 40 mg by mouth 2 (two) times daily.   unknown at unknown  . hydrALAZINE (APRESOLINE) 50 MG tablet Take 50 mg by mouth 3 (three) times daily.   unknown at unknown  . insulin glargine (LANTUS) 100 UNIT/ML injection Inject 20 Units into the skin at bedtime.    unknown at unknown  . insulin lispro (HUMALOG) 100 UNIT/ML injection Inject 0-15 Units into the skin 4 (four) times daily -  before meals and at bedtime. <150= 0 units 151-200= 3 units 201-250= 6 units 251-300= 9 units 301-350= 12 units 351-400= 15 units >400 call MD   unknown at unknown  . linaclotide (LINZESS) 145 MCG CAPS capsule Take 1 capsule (145 mcg total) by mouth daily before breakfast. 30 capsule 0 unknown at unknown  . loratadine (CLARITIN) 10 MG tablet Take 10 mg by mouth daily.   unknown at unknown  . Melatonin 5 MG TABS Take 5 mg by mouth at bedtime.   unknown at unknown  . methocarbamol (ROBAXIN) 500 MG tablet Take 1,000 mg by mouth 4 (four) times daily.    unknown at unknown  . metoCLOPramide (REGLAN) 10 MG tablet Take 1 tablet (10 mg total) by mouth every 8 (eight)  hours as needed for nausea, vomiting or refractory nausea / vomiting. 90 tablet 1 prn at prn  . metoprolol succinate (TOPROL-XL) 25 MG 24 hr tablet Take 25 mg by mouth daily.   unknown at unknown  . nicotine (NICODERM CQ - DOSED IN MG/24 HOURS) 21 mg/24hr patch Place 21 mg onto the skin daily.   unknown at unknown  . ondansetron (ZOFRAN) 4 MG tablet Take 4 mg by mouth every 6 (six) hours as needed for nausea or vomiting.   unknown at unknown  . oxyCODONE ER 9 MG C12A Take 9 mg by mouth 2 (two) times daily. 6 capsule 0 unknown at unknown  .  Oxycodone HCl 10 MG TABS Take 1 tablet (10 mg total) by mouth every 4 (four) hours as needed. (Patient taking differently: Take 10 mg by mouth every 4 (four) hours as needed (pain). ) 10 tablet 0 prn at prn  . polyethylene glycol (MIRALAX / GLYCOLAX) 17 g packet Take 17 g by mouth 2 (two) times daily. 14 each 0 unknown at unknown  . pregabalin (LYRICA) 75 MG capsule Take 75 mg by mouth 2 (two) times daily.   unknown at unknown  . senna-docusate (SENOKOT-S) 8.6-50 MG tablet Take 1 tablet by mouth 2 (two) times daily.   unknown at unknown  . sodium phosphate Pediatric (FLEET) 3.5-9.5 GM/59ML enema Place 1 enema rectally every other day as needed for severe constipation.   prn at prn  . traZODone (DESYREL) 50 MG tablet Take 50 mg by mouth at bedtime.    unknown at unknown   Scheduled: . amLODipine  10 mg Oral Daily  . bisacodyl  10 mg Rectal Daily  . Chlorhexidine Gluconate Cloth  6 each Topical Q0600  . docusate sodium  200 mg Oral BID  . enoxaparin (LOVENOX) injection  40 mg Subcutaneous Q24H  . erythromycin  250 mg Oral Q8H  . ferrous gluconate  324 mg Oral BID WC  . furosemide  40 mg Oral BID  . hydrALAZINE  50 mg Oral TID  . insulin aspart  0-20 Units Subcutaneous Q4H  . insulin glargine  20 Units Subcutaneous QHS  . linaclotide  290 mcg Oral QAC breakfast  . loratadine  10 mg Oral Daily  . Melatonin  5 mg Oral QHS  . methocarbamol  1,000 mg Oral QID   . metoCLOPramide (REGLAN) injection  10 mg Intravenous Q6H  . metoprolol succinate  25 mg Oral Daily  . mupirocin ointment  1 application Nasal BID  . nicotine  21 mg Transdermal Daily  . oxyCODONE  10 mg Oral BID  . pantoprazole (PROTONIX) IV  40 mg Intravenous Q12H  . polyethylene glycol  17 g Oral BID  . pregabalin  75 mg Oral BID  . scopolamine  1 patch Transdermal Q72H  . senna-docusate  1 tablet Oral BID  . sodium chloride flush  5 mL Intracatheter Q8H  . traZODone  50 mg Oral QHS   Continuous: . sodium chloride 100 mL/hr at 12/08/19 0400   ZYS:[id number] **OR** acetaminophen, labetalol, magnesium hydroxide, morphine injection, [DISCONTINUED] ondansetron **OR** ondansetron (ZOFRAN) IV, ondansetron, oxyCODONE, sodium phosphate Anti-infectives (From admission, onward)   Start     Dose/Rate Route Frequency Ordered Stop   12/08/19 0930  erythromycin (ERY-TAB) EC tablet 250 mg    Note to Pharmacy: For gastroparesis   250 mg Oral Every 8 hours 12/08/19 0909       Scheduled Meds: . amLODipine  10 mg Oral Daily  . bisacodyl  10 mg Rectal Daily  . Chlorhexidine Gluconate Cloth  6 each Topical Q0600  . docusate sodium  200 mg Oral BID  . enoxaparin (LOVENOX) injection  40 mg Subcutaneous Q24H  . erythromycin  250 mg Oral Q8H  . ferrous gluconate  324 mg Oral BID WC  . furosemide  40 mg Oral BID  . hydrALAZINE  50 mg Oral TID  . insulin aspart  0-20 Units Subcutaneous Q4H  . insulin glargine  20 Units Subcutaneous QHS  . linaclotide  290 mcg Oral QAC breakfast  . loratadine  10 mg Oral Daily  . Melatonin  5 mg Oral QHS  . methocarbamol  1,000 mg Oral QID  . metoCLOPramide (REGLAN) injection  10 mg Intravenous Q6H  . metoprolol succinate  25 mg Oral Daily  . mupirocin ointment  1 application Nasal BID  . nicotine  21 mg Transdermal Daily  . oxyCODONE  10 mg Oral BID  . pantoprazole (PROTONIX) IV  40 mg Intravenous Q12H  . polyethylene glycol  17 g Oral BID  . pregabalin   75 mg Oral BID  . scopolamine  1 patch Transdermal Q72H  . senna-docusate  1 tablet Oral BID  . sodium chloride flush  5 mL Intracatheter Q8H  . traZODone  50 mg Oral QHS   Continuous Infusions: . sodium chloride 100 mL/hr at 12/08/19 0400   PRN Meds:.acetaminophen **OR** acetaminophen, labetalol, magnesium hydroxide, morphine injection, [DISCONTINUED] ondansetron **OR** ondansetron (ZOFRAN) IV, ondansetron, oxyCODONE, sodium phosphate   Assessment: Active Problems:   Intractable nausea and vomiting  Intractable nausea and emesis s/p EGD on 3/15 which revealed LA grade B esophagitis DIAGNOSIS:  A. STOMACH, RANDOM; COLD BIOPSY:  - REACTIVE GASTROPATHY.  - NEGATIVE FOR ACTIVE INFLAMMATION AND H PYLORI.  - NEGATIVE FOR INTESTINAL METAPLASIA, DYSPLASIA, AND MALIGNANCY.  Plan: Intractable nausea and emesis: Most likely secondary to diabetic gastroparesis Recommend proton pump inhibitor like Protonix or omeprazole 40 mg twice daily for 2 to 3 months for underlying erosive esophagitis Start sucralfate elixir 1 g 3-4 times a day Agree with trial of erythromycin for possible underlying gastroparesis for 14 days Continue Reglan, decrease to 5 mg IV 3 times a day before meals and at bedtime Gastroparesis diet No further work-up from GI standpoint Tight control of diabetes     LOS: 5 days   Julie Nay 12/08/2019, 2:50 PM

## 2019-12-08 NOTE — Progress Notes (Signed)
Pt requested pain medication be pushed so that he "could feel it". Discussed administration time for medication is over 1-2 min. Pt grew beligerent yelling he just needed "5 seconds of relief" and that I could push the medication so he could feel it Discussed other options of pain management to include other medication available and non-pharmacological methods. Offered oral pain medications availible. Pt refused. Ask if I could do anything else to help him to be more comfortable. Pt request a mango italian ice. Ruta Hinds provided per pt request.

## 2019-12-09 LAB — BASIC METABOLIC PANEL
Anion gap: 7 (ref 5–15)
BUN: 9 mg/dL (ref 6–20)
CO2: 24 mmol/L (ref 22–32)
Calcium: 8.5 mg/dL — ABNORMAL LOW (ref 8.9–10.3)
Chloride: 110 mmol/L (ref 98–111)
Creatinine, Ser: 0.79 mg/dL (ref 0.61–1.24)
GFR calc Af Amer: >60 mL/min (ref >60)
GFR calc non Af Amer: >60 mL/min (ref >60)
Glucose, Bld: 157 mg/dL — ABNORMAL HIGH (ref 70–99)
Potassium: 3.2 mmol/L — ABNORMAL LOW (ref 3.5–5.1)
Sodium: 141 mmol/L (ref 135–145)

## 2019-12-09 LAB — URINE DRUG SCREEN, QUALITATIVE (ARMC ONLY)
Amphetamines, Ur Screen: NOT DETECTED
Barbiturates, Ur Screen: NOT DETECTED
Benzodiazepine, Ur Scrn: NOT DETECTED
Cannabinoid 50 Ng, Ur ~~LOC~~: NOT DETECTED
Cocaine Metabolite,Ur ~~LOC~~: NOT DETECTED
MDMA (Ecstasy)Ur Screen: NOT DETECTED
Methadone Scn, Ur: NOT DETECTED
Opiate, Ur Screen: POSITIVE — AB
Phencyclidine (PCP) Ur S: NOT DETECTED
Tricyclic, Ur Screen: NOT DETECTED

## 2019-12-09 LAB — CBC
HCT: 37.2 % — ABNORMAL LOW (ref 39.0–52.0)
Hemoglobin: 12.7 g/dL — ABNORMAL LOW (ref 13.0–17.0)
MCH: 30.6 pg (ref 26.0–34.0)
MCHC: 34.1 g/dL (ref 30.0–36.0)
MCV: 89.6 fL (ref 80.0–100.0)
Platelets: 180 10*3/uL (ref 150–400)
RBC: 4.15 MIL/uL — ABNORMAL LOW (ref 4.22–5.81)
RDW: 12.3 % (ref 11.5–15.5)
WBC: 7.1 10*3/uL (ref 4.0–10.5)
nRBC: 0 % (ref 0.0–0.2)

## 2019-12-09 LAB — GLUCOSE, CAPILLARY
Glucose-Capillary: 138 mg/dL — ABNORMAL HIGH (ref 70–99)
Glucose-Capillary: 152 mg/dL — ABNORMAL HIGH (ref 70–99)
Glucose-Capillary: 159 mg/dL — ABNORMAL HIGH (ref 70–99)
Glucose-Capillary: 218 mg/dL — ABNORMAL HIGH (ref 70–99)
Glucose-Capillary: 240 mg/dL — ABNORMAL HIGH (ref 70–99)
Glucose-Capillary: 253 mg/dL — ABNORMAL HIGH (ref 70–99)

## 2019-12-09 LAB — PHOSPHORUS: Phosphorus: 2.3 mg/dL — ABNORMAL LOW (ref 2.5–4.6)

## 2019-12-09 LAB — MAGNESIUM: Magnesium: 1.8 mg/dL (ref 1.7–2.4)

## 2019-12-09 MED ORDER — POTASSIUM PHOSPHATES 15 MMOLE/5ML IV SOLN
20.0000 mmol | Freq: Once | INTRAVENOUS | Status: AC
  Administered 2019-12-09: 18:00:00 20 mmol via INTRAVENOUS
  Filled 2019-12-09: qty 6.67

## 2019-12-09 MED ORDER — POTASSIUM CHLORIDE CRYS ER 20 MEQ PO TBCR
40.0000 meq | EXTENDED_RELEASE_TABLET | ORAL | Status: DC
  Administered 2019-12-09: 10:00:00 40 meq via ORAL
  Filled 2019-12-09: qty 2

## 2019-12-09 MED ORDER — CYANOCOBALAMIN 1000 MCG/ML IJ SOLN
1000.0000 ug | Freq: Every day | INTRAMUSCULAR | Status: DC
  Administered 2019-12-09 – 2019-12-14 (×4): 1000 ug via INTRAMUSCULAR
  Filled 2019-12-09 (×6): qty 1

## 2019-12-09 NOTE — Progress Notes (Signed)
PROGRESS NOTE    Eugene [name]  SEG:[id number] DOB: 05-22-1961 DOA: 12/02/2019 PCP: Abner Greenspan, MD    HPI was taken from Dr. Audelia Acton Eugene [name]  is a 59 y.o. Caucasian male who is a resident of [facility], with a known history of type II diabetes mellitus, dyslipidemia, anxiety and depression as well as hepatitis C and irritable bowel syndrome, who presented to the emergency room with acute onset of intractable nausea and vomiting with associated epigastric and periumbilical abdominal pain since 2 PM yesterday.  He admitted to constipation.  He has been having chills without tactile fever.  He denied any bilious vomitus or hematemesis or diarrhea.  No chest pain, dyspnea or cough or wheezing.  No exposure to COVID-19.  He has a suprapubic catheter and has not noticed any change in urine color or smell.  Upon presentation to the emergency room, blood pressure was 199/95 with respirate of 21 and otherwise normal vital signs.  Labs revealed blood glucose of 261 and a BUN of 21 with creatinine 1.07 with anion gap of 16 and CO2 of 19.  CBC showed no leukocytosis of 11.6.  COVID-19 PCR is currently pending.  Abdominal pelvic CT scan revealed progressed to distal fecal impaction since January with associated acute proctitis/stercoral colitis as well as redundant large bowel and substantial retained stool also on the left colon with no dilated small bowel.  It showed a stable suprapubic catheter and decompressed urinary bladder.  The patient was given hydration with IV normal saline, 4 mg of IV morphine sulfate, 10 mg of IV Reglan, 12.5 mg of IV Phenergan, 290 mcg of p.o. Linzess, 20 g of p.o. lactulose and smog enema in addition to disimpaction with significant amount of stools.  He continued to have nausea and vomiting however.  Will therefore be admitted to an observation medical bed for further evaluation and management.  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 S/p EGD 12/07/19 which showed esophagitis, biopsies were taken for testing for H. Pylori were done & path is pending. Intermittent as pt still c/o nausea & dry heaves. Continue on scheduled IV Reglan. Continue w/ scopolamine patch. Started on erythromycin for possible gastroparesis as nothing else has relieved the pt's symptoms. Continue on IVFs. Continue on PPI.  GI following and recs apprec UDS positive for opiates only  Hypokalemia: KCl repleated again. Will continue to monitor   Hypophosphatemia, phosphorus repleted.  Continue to monitor and replete as needed.  Constipation: S/p disimpaction for constipation. Bowel movement 12/05/19. Continue w/ colace, dulcolax supp. Enema prn   Exposed sutures of suprapubic catheter: suprapubic catheter replaced by IR 12/04/19. Resolved  DM2: uncontrolled with mild DKA. Continue on SSI w/ accuchecks. HbA1c 8.1  Hypertensive urgency: resolved. Continue on amlodipine, lasix & metoprolol. IV labetalol prn.  Peripheral neuropathy: continue on home dose of pregabalin   Obesity: BMI: 32.5. Would benefit from weight loss.  Transaminitis: resolved   DVT prophylaxis: lovenox Code Status: full  Family Communication:  Disposition Plan: Improving gradually, plan is to advance her diet today and monitor for tolerance.  Patient is still not taking oral medication and requesting IV morphine for pain control.  When pt is no longer vomiting and tolerating a po diet; will return to home facility likely   Consultants:  GI   Procedures: s/p EGD   Antimicrobials: erythromycin for possible gastroparesis  Subjective: No overnight issues, patient was seen and examined at bedside in the morning.   Patient stated no nausea and vomiting since yesterday Complaining of generalized abdominal tenderness and requesting IV morphine as  per RN patient is refusing oral medications.  Objective: Vitals:   12/08/19 1552 12/08/19 2012 12/08/19 2358 12/09/19 0726  BP: (!) 184/85 (!) 160/82 125/62 (!) 152/78  Pulse: (!) 54 (!) 51 (!) 59 60  Resp: 20  18 18   Temp: 98.4 F (36.9 C) 98.2 F (36.8 C) 98.4 F (36.9 C) 97.7 F (36.5 C)  TempSrc: Oral Oral  Oral  SpO2: 99% 97% 94% 97%  Weight:      Height:        Intake/Output Summary (Last 24 hours) at 12/09/2019 1410 Last data filed at 12/09/2019 1349 Gross per 24 hour  Intake 1486.26 ml  Output 2600 ml  Net -1113.74 ml   Filed Weights   12/03/19 0500 12/03/19 1700  Weight: 108.9 kg 108 kg    Examination:  General exam: Appears agitated  Respiratory system: Clear to auscultation. No wheezes, rales Cardiovascular system: S1 & S2 +. No rubs, gallops or clicks. Gastrointestinal system: Abdomen is obese, soft and nontender. Hypoactive bowel sounds heard. Central nervous system: Alert and oriented.  Psychiatry: Judgement and insight appear normal. Agitated and frustrated    Data Reviewed: I have personally reviewed following labs and imaging studies  CBC: Recent Labs  Lab 12/05/19 0936 12/06/19 0628 12/07/19 0622 12/08/19 0611 12/09/19 0421  WBC 9.7 6.3 6.5 7.1 7.1  HGB 12.2* 12.0* 12.6* 12.8* 12.7*  HCT 36.6* 35.0* 37.2* 36.6* 37.2*  MCV 91.7 90.4 89.2 88.8 89.6  PLT 178 158 187 192 789   Basic Metabolic Panel: Recent Labs  Lab 12/05/19 0936 12/06/19 0628 12/07/19 0622 12/08/19 0611 12/09/19 0421  NA 140 140 137 140 141  K 3.2* 3.2* 3.4* 3.1* 3.2*  CL 107 108 107 110 110  CO2 23 24 21* 23 24  GLUCOSE 234* 175* 196* 159* 157*  BUN 13 12 13 10 9   CREATININE 0.97 0.70 0.73 0.74 0.79  CALCIUM 8.5* 8.6* 8.4* 8.3* 8.5*  MG  --  2.0  --   --  1.8  PHOS  --   --   --   --  2.3*   GFR: Estimated Creatinine Clearance: 131 mL/min (by C-G formula based on SCr of 0.79 mg/dL). Liver Function Tests: Recent Labs  Lab 12/02/19 2131 12/04/19 0542  AST  29 27  ALT 61* 43  ALKPHOS 102 73  BILITOT 1.2 0.9  PROT 8.1 6.8  ALBUMIN 4.1 3.6   Recent Labs  Lab 12/02/19 2131  LIPASE 14   No results for input(s): AMMONIA in the last 168 hours. Coagulation Profile: No results for input(s): INR, PROTIME in the last 168 hours. Cardiac Enzymes: No results for input(s): CKTOTAL, CKMB, CKMBINDEX, TROPONINI in the last 168 hours. BNP (last 3 results) No results for input(s): PROBNP in the last 8760 hours. HbA1C: No results for input(s): HGBA1C in the last 72 hours. CBG: Recent Labs  Lab 12/08/19 2000 12/09/19 0000 12/09/19 0412 12/09/19 0727 12/09/19 1133  GLUCAP 192* 253* 159* 138* 240*   Lipid Profile: No results for input(s): CHOL, HDL, LDLCALC, TRIG, CHOLHDL, LDLDIRECT in the last 72 hours. Thyroid Function Tests: No results for input(s): TSH, T4TOTAL, FREET4, T3FREE, THYROIDAB in the last 72 hours. Anemia Panel: Recent Labs    12/07/19 1311  VITAMINB12 267  FOLATE 27.0  FERRITIN 76  TIBC 227*  IRON 67   Sepsis Labs: No results for input(s): PROCALCITON, LATICACIDVEN in the last 168 hours.  Recent Results (from the past 240 hour(s))  SARS CORONAVIRUS 2 (TAT 6-24 HRS) Nasopharyngeal Nasopharyngeal Swab     Status: None   Collection Time: 12/03/19  3:08 AM   Specimen: Nasopharyngeal Swab  Result Value Ref Range Status   SARS Coronavirus 2 NEGATIVE NEGATIVE Final    Comment: (NOTE) SARS-CoV-2 target nucleic acids are NOT DETECTED. The SARS-CoV-2 RNA is generally detectable in upper and lower respiratory specimens during the acute phase of infection. Negative results do not preclude SARS-CoV-2 infection, do not rule out co-infections with other pathogens, and should not be used as the sole basis for treatment or other patient management decisions. Negative results must be combined with clinical observations, patient history, and epidemiological information. The expected result is Negative. Fact Sheet for  Patients: HairSlick.no Fact Sheet for Healthcare Providers: quierodirigir.com This test is not yet approved or cleared by the [place] FDA and  has been authorized for detection and/or diagnosis of SARS-CoV-2 by FDA under an Emergency Use Authorization (EUA). This EUA will remain  in effect (meaning this test can be used) for the duration of the COVID-19 declaration under Section 56 4(b)(1) of the Act, 21 U.S.C. section 360bbb-3(b)(1), unless the authorization is terminated or revoked sooner. Performed at [facility], [address], [place], [place] [address]   MRSA PCR Screening     Status: Abnormal   Collection Time: 12/03/19 11:03 AM   Specimen: Nasopharyngeal  Result Value Ref Range Status   MRSA by PCR POSITIVE (A) NEGATIVE Final    Comment:        The GeneXpert MRSA Assay (FDA approved for NASAL specimens only), is one component of a comprehensive MRSA colonization surveillance program. It is not intended to diagnose MRSA infection nor to guide or monitor treatment for MRSA infections. RESULT CALLED TO, READ BACK BY AND VERIFIED WITH:  [facility] BIRDWELL AT 1330 12/03/19 SDR Performed at [facility], [address], [place], [place] [address]          Radiology Studies: No results found.      Scheduled Meds: . amLODipine  10 mg Oral Daily  . bisacodyl  10 mg Rectal Daily  . Chlorhexidine Gluconate Cloth  6 each Topical Q0600  . cyanocobalamin  1,000 mcg Intramuscular Daily  . docusate sodium  200 mg Oral BID  . enoxaparin (LOVENOX) injection  40 mg Subcutaneous Q24H  . erythromycin  250 mg Oral Q8H  . furosemide  40 mg Oral BID  . hydrALAZINE  50 mg Oral TID  . insulin aspart  0-20 Units Subcutaneous Q4H  . insulin glargine  20 Units Subcutaneous QHS  . linaclotide  290 mcg Oral QAC breakfast  . loratadine  10 mg Oral Daily  . Melatonin  5 mg Oral QHS  . methocarbamol  1,000  mg Oral QID  . metoCLOPramide (REGLAN) injection  10 mg Intravenous Q6H  . metoprolol succinate  25 mg Oral Daily  . mupirocin ointment  1 application Nasal BID  . nicotine  21 mg Transdermal Daily  . oxyCODONE  10 mg Oral BID  . pantoprazole (PROTONIX) IV  40 mg Intravenous Q12H  . polyethylene glycol  17 g Oral BID  . potassium chloride  40 mEq Oral Q4H  . pregabalin  75 mg Oral BID  . scopolamine  1 patch Transdermal Q72H  . senna-docusate  1 tablet Oral BID  . sodium chloride flush  5 mL Intracatheter Q8H  . traZODone  50 mg Oral QHS   Continuous Infusions: . sodium chloride 100 mL/hr at 12/09/19 0107     LOS: 6 days    Time spent: 30 mins     Gillis Santa, MD [facility] Pager [phone]  If 7PM-7AM, please contact night-coverage [email or web address] 12/09/2019, 2:10 PM

## 2019-12-09 NOTE — Progress Notes (Signed)
CH visited wit Pt as part of regular rounding. Pt was watching TV when Ch walked in. Pt reported that he has not been able to keep anything down for the past 8 days he has been here. When [facility] asked about family support, Pt reported that home is 130 miles away and that he is currently at a nursing facility. 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 Pt requested for prayer, to get better. Ch prayed with Pt. Pt was grateful for visit.

## 2019-12-10 ENCOUNTER — Inpatient Hospital Stay: Payer: Medicaid Other

## 2019-12-10 ENCOUNTER — Inpatient Hospital Stay
Admit: 2019-12-10 | Discharge: 2019-12-10 | Disposition: A | Discharge status: Home / Self Care | Payer: Medicaid Other | Attending: Student | Admitting: Student

## 2019-12-10 DIAGNOSIS — K5909 Other constipation: Secondary | ICD-10-CM

## 2019-12-10 LAB — HEPATIC FUNCTION PANEL
ALT: 116 U/L — ABNORMAL HIGH (ref 0–44)
AST: 100 U/L — ABNORMAL HIGH (ref 15–41)
Albumin: 2.9 g/dL — ABNORMAL LOW (ref 3.5–5.0)
Alkaline Phosphatase: 79 U/L (ref 38–126)
Bilirubin, Direct: 0.2 mg/dL (ref 0.0–0.2)
Indirect Bilirubin: 0.6 mg/dL (ref 0.3–0.9)
Total Bilirubin: 0.8 mg/dL (ref 0.3–1.2)
Total Protein: 6.1 g/dL — ABNORMAL LOW (ref 6.5–8.1)

## 2019-12-10 LAB — BASIC METABOLIC PANEL
Anion gap: 7 (ref 5–15)
BUN: 5 mg/dL — ABNORMAL LOW (ref 6–20)
CO2: 24 mmol/L (ref 22–32)
Calcium: 8.3 mg/dL — ABNORMAL LOW (ref 8.9–10.3)
Chloride: 108 mmol/L (ref 98–111)
Creatinine, Ser: 0.6 mg/dL — ABNORMAL LOW (ref 0.61–1.24)
GFR calc Af Amer: >60 mL/min (ref >60)
GFR calc non Af Amer: >60 mL/min (ref >60)
Glucose, Bld: 184 mg/dL — ABNORMAL HIGH (ref 70–99)
Potassium: 3.2 mmol/L — ABNORMAL LOW (ref 3.5–5.1)
Sodium: 139 mmol/L (ref 135–145)

## 2019-12-10 LAB — GLUCOSE, CAPILLARY
Glucose-Capillary: 143 mg/dL — ABNORMAL HIGH (ref 70–99)
Glucose-Capillary: 145 mg/dL — ABNORMAL HIGH (ref 70–99)
Glucose-Capillary: 145 mg/dL — ABNORMAL HIGH (ref 70–99)
Glucose-Capillary: 154 mg/dL — ABNORMAL HIGH (ref 70–99)
Glucose-Capillary: 157 mg/dL — ABNORMAL HIGH (ref 70–99)
Glucose-Capillary: 211 mg/dL — ABNORMAL HIGH (ref 70–99)

## 2019-12-10 LAB — ECHOCARDIOGRAM COMPLETE
Height: 72 in
Weight: 3809.55 oz

## 2019-12-10 LAB — MAGNESIUM: Magnesium: 1.8 mg/dL (ref 1.7–2.4)

## 2019-12-10 LAB — PHOSPHORUS: Phosphorus: 2.8 mg/dL (ref 2.5–4.6)

## 2019-12-10 MED ORDER — BISACODYL 5 MG PO TBEC
10.0000 mg | DELAYED_RELEASE_TABLET | Freq: Two times a day (BID) | ORAL | Status: DC
  Administered 2019-12-10 – 2019-12-14 (×9): 10 mg via ORAL
  Filled 2019-12-10 (×9): qty 2

## 2019-12-10 MED ORDER — POTASSIUM CHLORIDE 10 MEQ/100ML IV SOLN
10.0000 meq | INTRAVENOUS | Status: AC
  Administered 2019-12-10 (×4): 10 meq via INTRAVENOUS
  Filled 2019-12-10 (×3): qty 100

## 2019-12-10 MED ORDER — MAGNESIUM SULFATE 2 GM/50ML IV SOLN
2.0000 g | Freq: Once | INTRAVENOUS | Status: AC
  Administered 2019-12-10: 14:00:00 2 g via INTRAVENOUS
  Filled 2019-12-10: qty 50

## 2019-12-10 MED ORDER — BISACODYL 10 MG RE SUPP
10.0000 mg | Freq: Every evening | RECTAL | Status: DC | PRN
  Administered 2019-12-10: 10 mg via RECTAL

## 2019-12-10 MED ORDER — MINERAL OIL RE ENEM
1.0000 | ENEMA | Freq: Once | RECTAL | Status: DC
  Filled 2019-12-10: qty 1

## 2019-12-10 NOTE — Progress Notes (Signed)
Pt continues to decline enema, wants to give suppository time to work. Enema retimed. Able to get patient to take multiple oral medications after lunch despite declining earlier in the day. Will CTM patient and continue to offer meds.

## 2019-12-10 NOTE — Progress Notes (Signed)
PROGRESS NOTE    Eugene [name]  WUJ:[id number] DOB: 09/03/1961 DOA: 12/02/2019 PCP: Marco Collie, MD    HPI was taken from Dr. Percell Locus Slowey  is a 59 y.o. Caucasian male who is a resident of [facility], with a known history of type II diabetes mellitus, dyslipidemia, anxiety and depression as well as hepatitis C and irritable bowel syndrome, who presented to the emergency room with acute onset of intractable nausea and vomiting with associated epigastric and periumbilical abdominal pain since 2 PM yesterday.  He admitted to constipation.  He has been having chills without tactile fever.  He denied any bilious vomitus or hematemesis or diarrhea.  No chest pain, dyspnea or cough or wheezing.  No exposure to COVID-19.  He has a suprapubic catheter and has not noticed any change in urine color or smell.  Upon presentation to the emergency room, blood pressure was 199/95 with respirate of 21 and otherwise normal vital signs.  Labs revealed blood glucose of 261 and a BUN of 21 with creatinine 1.07 with anion gap of 16 and CO2 of 19.  CBC showed no leukocytosis of 11.6.  COVID-19 PCR is currently pending.  Abdominal pelvic CT scan revealed progressed to distal fecal impaction since January with associated acute proctitis/stercoral colitis as well as redundant large bowel and substantial retained stool also on the left colon with no dilated small bowel.  It showed a stable suprapubic catheter and decompressed urinary bladder.  The patient was given hydration with IV normal saline, 4 mg of IV morphine sulfate, 10 mg of IV Reglan, 12.5 mg of IV Phenergan, 290 mcg of p.o. Linzess, 20 g of p.o. lactulose and smog enema in addition to disimpaction with significant amount of stools.  He continued to have nausea and vomiting however.  Will therefore be admitted to an observation medical bed for further evaluation and management.  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 S/p EGD 12/07/19 which showed esophagitis, biopsies were taken for testing for H. Pylori were done & path is pending. Intermittent as pt still c/o nausea & dry heaves. Continue on scheduled IV Reglan. Continue w/ scopolamine patch. Started on erythromycin for possible gastroparesis as nothing else has relieved the pt's symptoms. Continue on IVFs. Continue on PPI.  GI following and recs apprec UDS positive for opiates only  Hypokalemia: KCl repleated again. Will continue to monitor   Hypophosphatemia, phosphorus repleted.  Continue to monitor and replete as needed.  Constipation: S/p disimpaction for constipation.   Started MiraLAX pod  and Dulcolax 10 mg p.o. twice daily S/p Fleet enema 3/17 3/18 mineral oil enema x 1 Continue Dulcolax suppository as needed 3/18 AXR: Large stool burden is noted suggesting constipation. No abnormal bowel dilatation is noted  Exposed sutures of suprapubic catheter: suprapubic catheter replaced by IR 12/04/19. Resolved  DM2: uncontrolled with mild DKA. Continue on SSI w/ accuchecks. HbA1c 8.1  Hypertensive urgency: resolved. Continue on amlodipine, lasix & metoprolol. IV labetalol prn.  Peripheral neuropathy: continue on home dose of pregabalin   Obesity: BMI: 32.5. Would benefit from weight loss.  Transaminitis: resolved   DVT prophylaxis: lovenox Code Status: full  Family Communication:  Disposition Plan: Improving gradually, plan is to advance her diet today and monitor for tolerance.  Patient is still not taking oral medication and requesting IV morphine for pain control.  Diet was advised and  patient started vomiting again so diet has been changed to full liquid today Abdominal x-ray shows large burden of stool so patient needs to be cleaned before discharge   Consultants:  GI   Procedures: s/p  EGD   Antimicrobials: erythromycin for possible gastroparesis    Subjective: He decided diet was advanced and patient could not handle so he has been vomiting throughout the night and requested to change his diet to liquid again today. Still has generalized abdominal tenderness, no chest pain or shortness of breath no palpitations. Patient is not cooperating with RN for giving him suppositories and enemas, we will continue to encourage but patient took oral laxatives  Objective: Vitals:   12/09/19 0726 12/09/19 1603 12/10/19 0016 12/10/19 0837  BP: (!) 152/78 130/72 (!) 158/75 (!) 160/75  Pulse: 60 68 64 (!) 59  Resp: 18 16 10 17   Temp: 97.7 F (36.5 C) 98.6 F (37 C) 98.9 F (37.2 C) 98.4 F (36.9 C)  TempSrc: Oral Oral Oral Oral  SpO2: 97% 97% 97% 96%  Weight:      Height:        Intake/Output Summary (Last 24 hours) at 12/10/2019 1357 Last data filed at 12/10/2019 0500 Gross per 24 hour  Intake 2965.45 ml  Output 2650 ml  Net 315.45 ml   Filed Weights   12/03/19 0500 12/03/19 1700  Weight: 108.9 kg 108 kg    Examination:  General exam: Appears agitated  Respiratory system: Clear to auscultation. No wheezes, rales Cardiovascular system: S1 & S2 +. No rubs, gallops or clicks. Gastrointestinal system: Abdomen is obese, soft and nontender. Hypoactive bowel sounds heard. Central nervous system: Alert and oriented.  Psychiatry: Judgement and insight appear normal. Agitated and frustrated Extremities: Bilateral BKA   Data Reviewed: I have personally reviewed following labs and imaging studies  CBC: Recent Labs  Lab 12/05/19 0936 12/06/19 0628 12/07/19 0622 12/08/19 0611 12/09/19 0421  WBC 9.7 6.3 6.5 7.1 7.1  HGB 12.2* 12.0* 12.6* 12.8* 12.7*  HCT 36.6* 35.0* 37.2* 36.6* 37.2*  MCV 91.7 90.4 89.2 88.8 89.6  PLT 178 158 187 192 180   Basic Metabolic Panel: Recent Labs  Lab 12/06/19 0628 12/07/19 0622 12/08/19 0611 12/09/19 0421 12/10/19 0415  NA 140  137 140 141 139  K 3.2* 3.4* 3.1* 3.2* 3.2*  CL 108 107 110 110 108  CO2 24 21* 23 24 24   GLUCOSE 175* 196* 159* 157* 184*  BUN 12 13 10 9  5*  CREATININE 0.70 0.73 0.74 0.79 0.60*  CALCIUM 8.6* 8.4* 8.3* 8.5* 8.3*  MG 2.0  --   --  1.8 1.8  PHOS  --   --   --  2.3* 2.8   GFR: Estimated Creatinine Clearance: 131 mL/min (A) (by C-G formula based on SCr of 0.6 mg/dL (L)). Liver Function Tests: Recent Labs  Lab 12/04/19 0542 12/10/19 0415  AST 27 100*  ALT 43 116*  ALKPHOS 73 79  BILITOT 0.9 0.8  PROT 6.8 6.1*  ALBUMIN 3.6 2.9*   No results for input(s): LIPASE, AMYLASE in the last 168 hours. No results for input(s): AMMONIA in the last 168 hours. Coagulation Profile: No results for input(s): INR, PROTIME in the last 168 hours. Cardiac Enzymes: No results for input(s): CKTOTAL, CKMB, CKMBINDEX, TROPONINI in the last 168 hours. BNP (last 3 results) No results for input(s): PROBNP in the last 8760 hours. HbA1C: No results for input(s): HGBA1C in the last 72 hours. CBG: Recent Labs  Lab 12/09/19  2013 12/10/19 0011 12/10/19 0411 12/10/19 0834 12/10/19 1146  GLUCAP 152* 145* 154* 143* 145*   Lipid Profile: No results for input(s): CHOL, HDL, LDLCALC, TRIG, CHOLHDL, LDLDIRECT in the last 72 hours. Thyroid Function Tests: No results for input(s): TSH, T4TOTAL, FREET4, T3FREE, THYROIDAB in the last 72 hours. Anemia Panel: No results for input(s): VITAMINB12, FOLATE, FERRITIN, TIBC, IRON, RETICCTPCT in the last 72 hours. Sepsis Labs: No results for input(s): PROCALCITON, LATICACIDVEN in the last 168 hours.  Recent Results (from the past 240 hour(s))  SARS CORONAVIRUS 2 (TAT 6-24 HRS) Nasopharyngeal Nasopharyngeal Swab     Status: None   Collection Time: 12/03/19  3:08 AM   Specimen: Nasopharyngeal Swab  Result Value Ref Range Status   SARS Coronavirus 2 NEGATIVE NEGATIVE Final    Comment: (NOTE) SARS-CoV-2 target nucleic acids are NOT DETECTED. The SARS-CoV-2 RNA is  generally detectable in upper and lower respiratory specimens during the acute phase of infection. Negative results do not preclude SARS-CoV-2 infection, do not rule out co-infections with other pathogens, and should not be used as the sole basis for treatment or other patient management decisions. Negative results must be combined with clinical observations, patient history, and epidemiological information. The expected result is Negative. Fact Sheet for Patients: HairSlick.nohttps://www.fda.gov/media/138098/download Fact Sheet for Healthcare Providers: quierodirigir.comhttps://www.fda.gov/media/138095/download This test is not yet approved or cleared by the [place] FDA and  has been authorized for detection and/or diagnosis of SARS-CoV-2 by FDA under an Emergency Use Authorization (EUA). This EUA will remain  in effect (meaning this test can be used) for the duration of the COVID-19 declaration under Section 56 4(b)(1) of the Act, 21 U.S.C. section 360bbb-3(b)(1), unless the authorization is terminated or revoked sooner. Performed at [facility], [address] [place], [place] [address]   MRSA PCR Screening     Status: Abnormal   Collection Time: 12/03/19 11:03 AM   Specimen: Nasopharyngeal  Result Value Ref Range Status   MRSA by PCR POSITIVE (A) NEGATIVE Final    Comment:        The GeneXpert MRSA Assay (FDA approved for NASAL specimens only), is one component of a comprehensive MRSA colonization surveillance program. It is not intended to diagnose MRSA infection nor to guide or monitor treatment for MRSA infections. RESULT CALLED TO, READ BACK BY AND VERIFIED WITH:  [facility] AT 1330 12/03/19 SDR Performed at [facility], [address] [place], [place] [address]          Radiology Studies: DG Abd 1 View  Result Date: 12/10/2019 CLINICAL DATA:  Abdominal pain. EXAM: ABDOMEN - 1 VIEW COMPARISON:  December 05, 2019. FINDINGS: No abnormal bowel dilatation is  noted. Large amount of stool seen throughout the colon. Suprapubic bladder catheter is again noted. No radio-opaque calculi or other significant radiographic abnormality are seen. IMPRESSION: Large stool burden is noted suggesting constipation. No abnormal bowel dilatation is noted. Electronically Signed   By: Lupita RaiderJames  Green Jr M.D.   On: 12/10/2019 08:44   ECHOCARDIOGRAM COMPLETE  Result Date: 12/10/2019    ECHOCARDIOGRAM REPORT   Patient Name:   Eugene StarrDANIEL Cromie Date of Exam: 12/10/2019 Medical Rec #:  [id number]      Height:       72.0 in Accession #:    [id number]     Weight:       238.1 lb Date of Birth:  17-Jul-1961      BSA:          2.294  m Patient Age:    58 years       BP:           160/75 mmHg Patient Gender: M              HR:           59 bpm. 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                  Risk Factors:Diabetes.  Sonographer:     Cristela Blue RDCS (AE) Referring Phys:  [id number] Gillis Santa Diagnosing Phys: Alwyn Pea MD IMPRESSIONS  1. Left ventricular ejection fraction, by estimation, is 70 to 75%. The left ventricle has hyperdynamic function. The left ventricle has no regional wall motion abnormalities. There is mild left ventricular hypertrophy. Left ventricular diastolic parameters are consistent with Grade I diastolic dysfunction (impaired relaxation).  2. Right ventricular systolic function is normal. The right ventricular size is normal.  3. The mitral valve is normal in structure. Trivial mitral valve regurgitation.  4. The aortic valve is normal in structure. Aortic valve regurgitation is not visualized. FINDINGS  Left Ventricle: Left ventricular ejection fraction, by estimation, is 70 to 75%. The left ventricle has hyperdynamic function. The left ventricle has no regional wall motion abnormalities. The left ventricular internal  cavity size was normal in size. There is mild left ventricular hypertrophy. Left ventricular diastolic parameters are consistent with Grade I diastolic dysfunction (impaired relaxation). Right Ventricle: The right ventricular size is normal. No increase in right ventricular wall thickness. Right ventricular systolic function is normal. Left Atrium: Left atrial size was normal in size. Right Atrium: Right atrial size was normal in size. Pericardium: There is no evidence of pericardial effusion. Mitral Valve: The mitral valve is normal in structure. Trivial mitral valve regurgitation. Tricuspid Valve: The tricuspid valve is grossly normal. Tricuspid valve regurgitation is trivial. Aortic Valve: The aortic valve is normal in structure. Aortic valve regurgitation is not visualized. Aortic valve mean gradient measures 2.0 mmHg. Aortic valve peak gradient measures 3.1 mmHg. Aortic valve area, by VTI measures 5.28 cm. Pulmonic Valve: The pulmonic valve was normal in structure. Pulmonic valve regurgitation is not visualized. Aorta: The aortic root is normal in size and structure. IAS/Shunts: No atrial level shunt detected by color flow Doppler.  LEFT VENTRICLE PLAX 2D LVIDd:         5.15 cm  Diastology LVIDs:         2.89 cm  LV e' lateral:   7.29 cm/s LV PW:         1.38 cm  LV E/e' lateral: 8.2 LV IVS:        1.25 cm  LV e' medial:    4.24 cm/s LVOT diam:     2.40 cm  LV E/e' medial:  14.2 LV SV:         117 LV SV Index:   51 LVOT Area:     4.52 cm  RIGHT VENTRICLE RV Basal diam:  2.68 cm RV S prime:     11.90 cm/s TAPSE (M-mode): 2.8 cm LEFT ATRIUM           Index       RIGHT ATRIUM           Index LA diam:  3.10 cm 1.35 cm/m  RA Area:     12.60 cm LA Vol (A2C): 96.0 ml 41.84 ml/m RA Volume:   29.90 ml  13.03 ml/m LA Vol (A4C): 38.3 ml 16.69 ml/m  AORTIC VALVE                   PULMONIC VALVE AV Area (Vmax):    4.90 cm    PV Vmax:        0.90 m/s AV Area (Vmean):   4.86 cm    PV Peak grad:   3.2 mmHg AV Area  (VTI):     5.28 cm    RVOT Peak grad: 3 mmHg AV Vmax:           88.25 cm/s AV Vmean:          63.150 cm/s AV VTI:            0.222 m AV Peak Grad:      3.1 mmHg AV Mean Grad:      2.0 mmHg LVOT Vmax:         95.60 cm/s LVOT Vmean:        67.900 cm/s LVOT VTI:          0.259 m LVOT/AV VTI ratio: 1.17  AORTA Ao Root diam: 3.70 cm MITRAL VALVE MV Area (PHT): 1.97 cm    SHUNTS MV Decel Time: 385 msec    Systemic VTI:  0.26 m MV E velocity: 60.10 cm/s  Systemic Diam: 2.40 cm MV A velocity: 80.50 cm/s MV E/A ratio:  0.75 Dwayne D Callwood MD Electronically signed by Alwyn Pea MD Signature Date/Time: 12/10/2019/1:00:41 PM    Final         Scheduled Meds: . amLODipine  10 mg Oral Daily  . bisacodyl  10 mg Oral BID  . Chlorhexidine Gluconate Cloth  6 each Topical Q0600  . cyanocobalamin  1,000 mcg Intramuscular Daily  . enoxaparin (LOVENOX) injection  40 mg Subcutaneous Q24H  . erythromycin  250 mg Oral Q8H  . hydrALAZINE  50 mg Oral TID  . insulin aspart  0-20 Units Subcutaneous Q4H  . insulin glargine  20 Units Subcutaneous QHS  . linaclotide  290 mcg Oral QAC breakfast  . loratadine  10 mg Oral Daily  . Melatonin  5 mg Oral QHS  . methocarbamol  1,000 mg Oral QID  . metoCLOPramide (REGLAN) injection  10 mg Intravenous Q6H  . metoprolol succinate  25 mg Oral Daily  . mineral oil  1 enema Rectal Once  . mupirocin ointment  1 application Nasal BID  . nicotine  21 mg Transdermal Daily  . oxyCODONE  10 mg Oral BID  . pantoprazole (PROTONIX) IV  40 mg Intravenous Q12H  . polyethylene glycol  17 g Oral BID  . pregabalin  75 mg Oral BID  . scopolamine  1 patch Transdermal Q72H  . sodium chloride flush  5 mL Intracatheter Q8H  . traZODone  50 mg Oral QHS   Continuous Infusions: . sodium chloride 100 mL/hr at 12/10/19 0500  . magnesium sulfate bolus IVPB 2 g (12/10/19 1348)     LOS: 7 days    Time spent: 30 mins     Gillis Santa, MD [facility] Pager [phone]  If  7PM-7AM, please contact night-coverage [email or web address] 12/10/2019, 1:57 PM

## 2019-12-10 NOTE — Progress Notes (Signed)
*  PRELIMINARY RESULTS* Echocardiogram 2D Echocardiogram has been performed.  Eugene [name] 12/10/2019, 11:02 AM

## 2019-12-10 NOTE — Progress Notes (Signed)
Eugene Darby, MD [address]  [place]  [place], [place] [address]  Main: [phone]  Fax: [phone] Pager: [phone]   Subjective: Patient reports ongoing nausea, was able to tolerate small amount of p.o., full liquids today.  He reported abdominal tightness and x-ray KUB revealed severe constipation.  Patient is started on aggressive bowel regimen.  He was able to take some p.o. medication as well.  He denies any abdominal pain today  Objective: Vital signs in last 24 hours: Vitals:   12/09/19 1603 12/10/19 0016 12/10/19 0837 12/10/19 1550  BP: 130/72 (!) 158/75 (!) 160/75 (!) 147/78  Pulse: 68 64 (!) 59 65  Resp: 16 10 17 16   Temp: 98.6 F (37 C) 98.9 F (37.2 C) 98.4 F (36.9 C) 98.4 F (36.9 C)  TempSrc: Oral Oral Oral Oral  SpO2: 97% 97% 96%   Weight:      Height:       Weight change:   Intake/Output Summary (Last 24 hours) at 12/10/2019 1635 Last data filed at 12/10/2019 0500 Gross per 24 hour  Intake 2965.45 ml  Output 2650 ml  Net 315.45 ml     Exam: Heart:: Regular rate and rhythm, S1S2 present or without murmur or extra heart sounds Lungs: normal and clear to auscultation Abdomen: soft, nontender, normal bowel sounds   Lab Results: CBC Latest Ref Rng & Units 12/09/2019 12/08/2019 12/07/2019  WBC 4.0 - 10.5 K/uL 7.1 7.1 6.5  Hemoglobin 13.0 - 17.0 g/dL 12.7(L) 12.8(L) 12.6(L)  Hematocrit 39.0 - 52.0 % 37.2(L) 36.6(L) 37.2(L)  Platelets 150 - 400 K/uL 180 192 187   CMP Latest Ref Rng & Units 12/10/2019 12/09/2019 12/08/2019  Glucose 70 - 99 mg/dL 184(H) 157(H) 159(H)  BUN 6 - 20 mg/dL 5(L) 9 10  Creatinine 0.61 - 1.24 mg/dL 0.60(L) 0.79 0.74  Sodium 135 - 145 mmol/L 139 141 140  Potassium 3.5 - 5.1 mmol/L 3.2(L) 3.2(L) 3.1(L)  Chloride 98 - 111 mmol/L 108 110 110  CO2 22 - 32 mmol/L 24 24 23   Calcium 8.9 - 10.3 mg/dL 8.3(L) 8.5(L) 8.3(L)  Total Protein 6.5 - 8.1 g/dL 6.1(L) - -  Total Bilirubin 0.3 - 1.2 mg/dL 0.8 - -  Alkaline  Phos 38 - 126 U/L 79 - -  AST 15 - 41 U/L 100(H) - -  ALT 0 - 44 U/L 116(H) - -    Micro Results: Recent Results (from the past 240 hour(s))  SARS CORONAVIRUS 2 (TAT 6-24 HRS) Nasopharyngeal Nasopharyngeal Swab     Status: None   Collection Time: 12/03/19  3:08 AM   Specimen: Nasopharyngeal Swab  Result Value Ref Range Status   SARS Coronavirus 2 NEGATIVE NEGATIVE Final    Comment: (NOTE) SARS-CoV-2 target nucleic acids are NOT DETECTED. The SARS-CoV-2 RNA is generally detectable in upper and lower respiratory specimens during the acute phase of infection. Negative results do not preclude SARS-CoV-2 infection, do not rule out co-infections with other pathogens, and should not be used as the sole basis for treatment or other patient management decisions. Negative results must be combined with clinical observations, patient history, and epidemiological information. The expected result is Negative. Fact Sheet for Patients: SugarRoll.be Fact Sheet for Healthcare Providers: https://www.woods-mathews.com/ This test is not yet approved or cleared by the [place] FDA and  has been authorized for detection and/or diagnosis of SARS-CoV-2 by FDA under an Emergency Use Authorization (EUA). This EUA will remain  in effect (meaning this test can be  used) for the duration of the COVID-19 declaration under Section 56 4(b)(1) of the Act, 21 U.S.C. section 360bbb-3(b)(1), unless the authorization is terminated or revoked sooner. Performed at [facility], [address] [place], [place] [address]   MRSA PCR Screening     Status: Abnormal   Collection Time: 12/03/19 11:03 AM   Specimen: Nasopharyngeal  Result Value Ref Range Status   MRSA by PCR POSITIVE (A) NEGATIVE Final    Comment:        The GeneXpert MRSA Assay (FDA approved for NASAL specimens only), is one component of a comprehensive MRSA colonization surveillance program. It is  not intended to diagnose MRSA infection nor to guide or monitor treatment for MRSA infections. RESULT CALLED TO, READ BACK BY AND VERIFIED WITH:  [facility] BIRDWELL AT 1330 12/03/19 SDR Performed at [facility], [address] [place], [place] [address]    Studies/Results: [place] Abd 1 View  Result Date: 12/10/2019 CLINICAL DATA:  Abdominal pain. EXAM: ABDOMEN - 1 VIEW COMPARISON:  December 05, 2019. FINDINGS: No abnormal bowel dilatation is noted. Large amount of stool seen throughout the colon. Suprapubic bladder catheter is again noted. No radio-opaque calculi or other significant radiographic abnormality are seen. IMPRESSION: Large stool burden is noted suggesting constipation. No abnormal bowel dilatation is noted. Electronically Signed   By: Lupita Raider M.D.   On: 12/10/2019 08:44   ECHOCARDIOGRAM COMPLETE  Result Date: 12/10/2019    ECHOCARDIOGRAM REPORT   [name] Date of Exam: 12/10/2019 Medical Rec #:  [id number]      Height:       72.0 in Accession #:    [id number]     Weight:       238.1 lb Date of Birth:  January 01, 1961      BSA:          2.294 m Patient Age:    58 years       BP:           160/75 mmHg Patient Gender: M              HR:           59 bpm. 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                  Risk Factors:Diabetes.  Sonographer:     Cristela Blue RDCS (AE) Referring Phys:  [id number] Gillis Santa Diagnosing Phys: Alwyn Pea MD IMPRESSIONS  1. Left ventricular ejection fraction, by estimation, is 70 to 75%. The left ventricle has hyperdynamic function. The left ventricle has no regional wall motion abnormalities. There is mild left ventricular hypertrophy. Left ventricular diastolic parameters are consistent with Grade I diastolic dysfunction (impaired relaxation).  2. Right ventricular  systolic function is normal. The right ventricular size is normal.  3. The mitral valve is normal in structure. Trivial mitral valve regurgitation.  4. The aortic valve is normal in structure. Aortic valve regurgitation is not visualized. FINDINGS  Left Ventricle: Left ventricular ejection fraction, by estimation, is 70 to 75%. The left ventricle has hyperdynamic function. The left ventricle has no regional wall motion abnormalities. The left ventricular internal cavity size was normal in size. There is mild left ventricular hypertrophy. Left ventricular diastolic parameters  are consistent with Grade I diastolic dysfunction (impaired relaxation). Right Ventricle: The right ventricular size is normal. No increase in right ventricular wall thickness. Right ventricular systolic function is normal. Left Atrium: Left atrial size was normal in size. Right Atrium: Right atrial size was normal in size. Pericardium: There is no evidence of pericardial effusion. Mitral Valve: The mitral valve is normal in structure. Trivial mitral valve regurgitation. Tricuspid Valve: The tricuspid valve is grossly normal. Tricuspid valve regurgitation is trivial. Aortic Valve: The aortic valve is normal in structure. Aortic valve regurgitation is not visualized. Aortic valve mean gradient measures 2.0 mmHg. Aortic valve peak gradient measures 3.1 mmHg. Aortic valve area, by VTI measures 5.28 cm. Pulmonic Valve: The pulmonic valve was normal in structure. Pulmonic valve regurgitation is not visualized. Aorta: The aortic root is normal in size and structure. IAS/Shunts: No atrial level shunt detected by color flow Doppler.  LEFT VENTRICLE PLAX 2D LVIDd:         5.15 cm  Diastology LVIDs:         2.89 cm  LV e' lateral:   7.29 cm/s LV PW:         1.38 cm  LV E/e' lateral: 8.2 LV IVS:        1.25 cm  LV e' medial:    4.24 cm/s LVOT diam:     2.40 cm  LV E/e' medial:  14.2 LV SV:         117 LV SV Index:   51 LVOT Area:     4.52 cm  RIGHT  VENTRICLE RV Basal diam:  2.68 cm RV S prime:     11.90 cm/s TAPSE (M-mode): 2.8 cm LEFT ATRIUM           Index       RIGHT ATRIUM           Index LA diam:      3.10 cm 1.35 cm/m  RA Area:     12.60 cm LA Vol (A2C): 96.0 ml 41.84 ml/m RA Volume:   29.90 ml  13.03 ml/m LA Vol (A4C): 38.3 ml 16.69 ml/m  AORTIC VALVE                   PULMONIC VALVE AV Area (Vmax):    4.90 cm    PV Vmax:        0.90 m/s AV Area (Vmean):   4.86 cm    PV Peak grad:   3.2 mmHg AV Area (VTI):     5.28 cm    RVOT Peak grad: 3 mmHg AV Vmax:           88.25 cm/s AV Vmean:          63.150 cm/s AV VTI:            0.222 m AV Peak Grad:      3.1 mmHg AV Mean Grad:      2.0 mmHg LVOT Vmax:         95.60 cm/s LVOT Vmean:        67.900 cm/s LVOT VTI:          0.259 m LVOT/AV VTI ratio: 1.17  AORTA Ao Root diam: 3.70 cm MITRAL VALVE MV Area (PHT): 1.97 cm    SHUNTS MV Decel Time: 385 msec    Systemic VTI:  0.26 m MV E velocity: 60.10 cm/s  Systemic Diam: 2.40 cm MV A velocity: 80.50 cm/s MV E/A ratio:  0.75 Dwayne D Callwood  MD Electronically signed by Alwyn Peawayne D Callwood MD Signature Date/Time: 12/10/2019/1:00:41 PM    Final    Medications:  I have reviewed the patient's current medications. Prior to Admission:  Medications Prior to Admission  Medication Sig Dispense Refill Last Dose  . acetaminophen (TYLENOL) 325 MG tablet Take 650 mg by mouth every 4 (four) hours as needed for mild pain or fever.   prn at prn  . amLODipine (NORVASC) 10 MG tablet Take 10 mg by mouth daily.   unknown at unknown  . bisacodyl (DULCOLAX) 10 MG suppository Place 10 mg rectally daily as needed for moderate constipation.   prn at prn  . famotidine (PEPCID) 20 MG tablet Take 20 mg by mouth daily.    unknown at unknown  . ferrous gluconate (FERGON) 324 MG tablet Take 324 mg by mouth 2 (two) times daily with a meal.    unknown at unknown  . furosemide (LASIX) 40 MG tablet Take 40 mg by mouth 2 (two) times daily.   unknown at unknown  . hydrALAZINE  (APRESOLINE) 50 MG tablet Take 50 mg by mouth 3 (three) times daily.   unknown at unknown  . insulin glargine (LANTUS) 100 UNIT/ML injection Inject 20 Units into the skin at bedtime.    unknown at unknown  . insulin lispro (HUMALOG) 100 UNIT/ML injection Inject 0-15 Units into the skin 4 (four) times daily -  before meals and at bedtime. <150= 0 units 151-200= 3 units 201-250= 6 units 251-300= 9 units 301-350= 12 units 351-400= 15 units >400 call MD   unknown at unknown  . linaclotide (LINZESS) 145 MCG CAPS capsule Take 1 capsule (145 mcg total) by mouth daily before breakfast. 30 capsule 0 unknown at unknown  . loratadine (CLARITIN) 10 MG tablet Take 10 mg by mouth daily.   unknown at unknown  . Melatonin 5 MG TABS Take 5 mg by mouth at bedtime.   unknown at unknown  . methocarbamol (ROBAXIN) 500 MG tablet Take 1,000 mg by mouth 4 (four) times daily.    unknown at unknown  . metoCLOPramide (REGLAN) 10 MG tablet Take 1 tablet (10 mg total) by mouth every 8 (eight) hours as needed for nausea, vomiting or refractory nausea / vomiting. 90 tablet 1 prn at prn  . metoprolol succinate (TOPROL-XL) 25 MG 24 hr tablet Take 25 mg by mouth daily.   unknown at unknown  . nicotine (NICODERM CQ - DOSED IN MG/24 HOURS) 21 mg/24hr patch Place 21 mg onto the skin daily.   unknown at unknown  . ondansetron (ZOFRAN) 4 MG tablet Take 4 mg by mouth every 6 (six) hours as needed for nausea or vomiting.   unknown at unknown  . oxyCODONE ER 9 MG C12A Take 9 mg by mouth 2 (two) times daily. 6 capsule 0 unknown at unknown  . Oxycodone HCl 10 MG TABS Take 1 tablet (10 mg total) by mouth every 4 (four) hours as needed. (Patient taking differently: Take 10 mg by mouth every 4 (four) hours as needed (pain). ) 10 tablet 0 prn at prn  . polyethylene glycol (MIRALAX / GLYCOLAX) 17 g packet Take 17 g by mouth 2 (two) times daily. 14 each 0 unknown at unknown  . pregabalin (LYRICA) 75 MG capsule Take 75 mg by mouth 2 (two) times  daily.   unknown at unknown  . senna-docusate (SENOKOT-S) 8.6-50 MG tablet Take 1 tablet by mouth 2 (two) times daily.   unknown at unknown  . sodium phosphate  Pediatric (FLEET) 3.5-9.5 GM/59ML enema Place 1 enema rectally every other day as needed for severe constipation.   prn at prn  . traZODone (DESYREL) 50 MG tablet Take 50 mg by mouth at bedtime.    unknown at unknown   Scheduled: . amLODipine  10 mg Oral Daily  . bisacodyl  10 mg Oral BID  . Chlorhexidine Gluconate Cloth  6 each Topical Q0600  . cyanocobalamin  1,000 mcg Intramuscular Daily  . enoxaparin (LOVENOX) injection  40 mg Subcutaneous Q24H  . erythromycin  250 mg Oral Q8H  . hydrALAZINE  50 mg Oral TID  . insulin aspart  0-20 Units Subcutaneous Q4H  . insulin glargine  20 Units Subcutaneous QHS  . linaclotide  290 mcg Oral QAC breakfast  . loratadine  10 mg Oral Daily  . Melatonin  5 mg Oral QHS  . methocarbamol  1,000 mg Oral QID  . metoCLOPramide (REGLAN) injection  10 mg Intravenous Q6H  . metoprolol succinate  25 mg Oral Daily  . mineral oil  1 enema Rectal Once  . mupirocin ointment  1 application Nasal BID  . nicotine  21 mg Transdermal Daily  . oxyCODONE  10 mg Oral BID  . pantoprazole (PROTONIX) IV  40 mg Intravenous Q12H  . polyethylene glycol  17 g Oral BID  . pregabalin  75 mg Oral BID  . scopolamine  1 patch Transdermal Q72H  . sodium chloride flush  5 mL Intracatheter Q8H  . traZODone  50 mg Oral QHS   Continuous: . sodium chloride 100 mL/hr at 12/10/19 0500  . potassium chloride 10 mEq (12/10/19 1550)   ZYS:[id number] **OR** acetaminophen, bisacodyl, labetalol, magnesium hydroxide, morphine injection, [DISCONTINUED] ondansetron **OR** ondansetron (ZOFRAN) IV, ondansetron, oxyCODONE, sodium phosphate Anti-infectives (From admission, onward)   Start     Dose/Rate Route Frequency Ordered Stop   12/08/19 0930  erythromycin (ERY-TAB) EC tablet 250 mg    Note to Pharmacy: For gastroparesis   250 mg  Oral Every 8 hours 12/08/19 0909       Scheduled Meds: . amLODipine  10 mg Oral Daily  . bisacodyl  10 mg Oral BID  . Chlorhexidine Gluconate Cloth  6 each Topical Q0600  . cyanocobalamin  1,000 mcg Intramuscular Daily  . enoxaparin (LOVENOX) injection  40 mg Subcutaneous Q24H  . erythromycin  250 mg Oral Q8H  . hydrALAZINE  50 mg Oral TID  . insulin aspart  0-20 Units Subcutaneous Q4H  . insulin glargine  20 Units Subcutaneous QHS  . linaclotide  290 mcg Oral QAC breakfast  . loratadine  10 mg Oral Daily  . Melatonin  5 mg Oral QHS  . methocarbamol  1,000 mg Oral QID  . metoCLOPramide (REGLAN) injection  10 mg Intravenous Q6H  . metoprolol succinate  25 mg Oral Daily  . mineral oil  1 enema Rectal Once  . mupirocin ointment  1 application Nasal BID  . nicotine  21 mg Transdermal Daily  . oxyCODONE  10 mg Oral BID  . pantoprazole (PROTONIX) IV  40 mg Intravenous Q12H  . polyethylene glycol  17 g Oral BID  . pregabalin  75 mg Oral BID  . scopolamine  1 patch Transdermal Q72H  . sodium chloride flush  5 mL Intracatheter Q8H  . traZODone  50 mg Oral QHS   Continuous Infusions: . sodium chloride 100 mL/hr at 12/10/19 0500  . potassium chloride 10 mEq (12/10/19 1550)   PRN Meds:.acetaminophen **OR** acetaminophen, bisacodyl, labetalol, magnesium  hydroxide, morphine injection, [DISCONTINUED] ondansetron **OR** ondansetron (ZOFRAN) IV, ondansetron, oxyCODONE, sodium phosphate   Assessment: Active Problems:   Intractable nausea and vomiting  Intractable nausea and emesis s/p EGD on 3/15 which revealed LA grade B esophagitis DIAGNOSIS:  A. STOMACH, RANDOM; COLD BIOPSY:  - REACTIVE GASTROPATHY.  - NEGATIVE FOR ACTIVE INFLAMMATION AND H PYLORI.  - NEGATIVE FOR INTESTINAL METAPLASIA, DYSPLASIA, AND MALIGNANCY.  Plan: Intractable nausea and emesis: Most likely secondary to diabetic gastroparesis Continue Protonix 40 mg twice daily for 2 to 3 months for underlying erosive  esophagitis Continue erythromycin for possible underlying gastroparesis for 14 days Continue Reglan, decrease to 5 mg IV 3 times a day before meals and at bedtime Gastroparesis diet No further work-up from GI standpoint Tight control of diabetes  Chronic constipation Recommend to start Linzess 290 MCG daily  GI will sign off at this time, please call us back with questions or concerns    LOS: 7 days   Eugene [name] 12/10/2019, 4:35 PM

## 2019-12-10 NOTE — Progress Notes (Signed)
Had moderate jelly like beige stool with one hard ball of stool appx 1.5 inches arround after suppository and enema. Not significant output. MD notified.

## 2019-12-11 ENCOUNTER — Inpatient Hospital Stay: Payer: Medicaid Other

## 2019-12-11 LAB — BASIC METABOLIC PANEL
Anion gap: 6 (ref 5–15)
BUN: 7 mg/dL (ref 6–20)
CO2: 25 mmol/L (ref 22–32)
Calcium: 8 mg/dL — ABNORMAL LOW (ref 8.9–10.3)
Chloride: 109 mmol/L (ref 98–111)
Creatinine, Ser: 0.98 mg/dL (ref 0.61–1.24)
GFR calc Af Amer: >60 mL/min (ref >60)
GFR calc non Af Amer: >60 mL/min (ref >60)
Glucose, Bld: 166 mg/dL — ABNORMAL HIGH (ref 70–99)
Potassium: 3.3 mmol/L — ABNORMAL LOW (ref 3.5–5.1)
Sodium: 140 mmol/L (ref 135–145)

## 2019-12-11 LAB — CBC
HCT: 33.8 % — ABNORMAL LOW (ref 39.0–52.0)
Hemoglobin: 11.6 g/dL — ABNORMAL LOW (ref 13.0–17.0)
MCH: 31.5 pg (ref 26.0–34.0)
MCHC: 34.3 g/dL (ref 30.0–36.0)
MCV: 91.8 fL (ref 80.0–100.0)
Platelets: 171 10*3/uL (ref 150–400)
RBC: 3.68 MIL/uL — ABNORMAL LOW (ref 4.22–5.81)
RDW: 12.8 % (ref 11.5–15.5)
WBC: 5.8 10*3/uL (ref 4.0–10.5)
nRBC: 0 % (ref 0.0–0.2)

## 2019-12-11 LAB — HEPATIC FUNCTION PANEL
ALT: 76 U/L — ABNORMAL HIGH (ref 0–44)
AST: 37 U/L (ref 15–41)
Albumin: 2.7 g/dL — ABNORMAL LOW (ref 3.5–5.0)
Alkaline Phosphatase: 67 U/L (ref 38–126)
Bilirubin, Direct: 0.1 mg/dL (ref 0.0–0.2)
Indirect Bilirubin: 0.6 mg/dL (ref 0.3–0.9)
Total Bilirubin: 0.7 mg/dL (ref 0.3–1.2)
Total Protein: 5.4 g/dL — ABNORMAL LOW (ref 6.5–8.1)

## 2019-12-11 LAB — GLUCOSE, CAPILLARY
Glucose-Capillary: 133 mg/dL — ABNORMAL HIGH (ref 70–99)
Glucose-Capillary: 153 mg/dL — ABNORMAL HIGH (ref 70–99)
Glucose-Capillary: 155 mg/dL — ABNORMAL HIGH (ref 70–99)
Glucose-Capillary: 156 mg/dL — ABNORMAL HIGH (ref 70–99)
Glucose-Capillary: 210 mg/dL — ABNORMAL HIGH (ref 70–99)
Glucose-Capillary: 235 mg/dL — ABNORMAL HIGH (ref 70–99)

## 2019-12-11 LAB — PHOSPHORUS: Phosphorus: 2.9 mg/dL (ref 2.5–4.6)

## 2019-12-11 LAB — MAGNESIUM: Magnesium: 2 mg/dL (ref 1.7–2.4)

## 2019-12-11 MED ORDER — POTASSIUM CHLORIDE CRYS ER 20 MEQ PO TBCR
40.0000 meq | EXTENDED_RELEASE_TABLET | Freq: Once | ORAL | Status: AC
  Administered 2019-12-11: 40 meq via ORAL
  Filled 2019-12-11: qty 2

## 2019-12-11 MED ORDER — MINERAL OIL RE ENEM
1.0000 | ENEMA | Freq: Once | RECTAL | Status: AC
  Administered 2019-12-11: 21:00:00 1 via RECTAL

## 2019-12-11 NOTE — Progress Notes (Signed)
Nutrition Brief Note  Patient identified on the Malnutrition Screening Tool (MST) Report  Wt Readings from Last 15 Encounters:  12/03/19 108 kg  10/24/19 106.9 kg  10/12/19 108.9 kg  10/07/19 111.3 kg  09/17/19 108 kg  09/07/19 108.9 kg  08/18/19 108.9 kg  07/20/19 104.3 kg  02/26/19 106 kg  02/09/19 106 kg  02/02/19 106.6 kg   Met with patient at bedside. He reports he had decreased appetite and intake for several days related to N/V and constipation. He reports his appetite is much better now and he is eating almost all of his meals. According to chart patient has been eating 80-100% of meals since 3/13. He denies any unintentional weight loss and reports he has gained approximately 70 lbs slowly over time. Patient reports he has good understanding of DM and gastroparesis diet and denies any educational needs at this time. No muscle or subcutaneous fat wasting identified on Nutrition-Focused Physical Exam. Patient does not meet criteria for malnutrition at this time.  Current diet order is soft, patient is consuming approximately 80-100% of meals at this time. Labs and medications reviewed.   No nutrition interventions warranted at this time. If nutrition issues arise, please consult RD.   Jacklynn Barnacle, MS, RD, LDN Pager number available on Amion

## 2019-12-11 NOTE — Progress Notes (Signed)
PROGRESS NOTE    Eugene [name]  DUK:[id number] DOB: 03-09-1961 DOA: 12/02/2019 PCP: Marco Collie, MD    HPI was taken from Dr. Percell Locus Gantt  is a 59 y.o. Caucasian male who is a resident of [facility], with a known history of type II diabetes mellitus, dyslipidemia, anxiety and depression as well as hepatitis C and irritable bowel syndrome, who presented to the emergency room with acute onset of intractable nausea and vomiting with associated epigastric and periumbilical abdominal pain since 2 PM yesterday.  He admitted to constipation.  He has been having chills without tactile fever.  He denied any bilious vomitus or hematemesis or diarrhea.  No chest pain, dyspnea or cough or wheezing.  No exposure to COVID-19.  He has a suprapubic catheter and has not noticed any change in urine color or smell.  Upon presentation to the emergency room, blood pressure was 199/95 with respirate of 21 and otherwise normal vital signs.  Labs revealed blood glucose of 261 and a BUN of 21 with creatinine 1.07 with anion gap of 16 and CO2 of 19.  CBC showed no leukocytosis of 11.6.  COVID-19 PCR is currently pending.  Abdominal pelvic CT scan revealed progressed to distal fecal impaction since January with associated acute proctitis/stercoral colitis as well as redundant large bowel and substantial retained stool also on the left colon with no dilated small bowel.  It showed a stable suprapubic catheter and decompressed urinary bladder.  The patient was given hydration with IV normal saline, 4 mg of IV morphine sulfate, 10 mg of IV Reglan, 12.5 mg of IV Phenergan, 290 mcg of p.o. Linzess, 20 g of p.o. lactulose and smog enema in addition to disimpaction with significant amount of stools.  He continued to have nausea and vomiting however.  Will therefore be admitted to an observation medical bed for further evaluation and management.  Assessment & Plan:   Active Problems:  Intractable nausea and vomiting   Intractable nausea and vomiting: etiology unclear, possibly secondary to diabetic gastroparesis vs severe constipation.  GI consulted, s/p EGD 12/07/19 which showed esophagitis, biopsies were taken for testing for H. Pylori were done & path is pending. Intermittent as pt still c/o nausea & dry heaves. Continue on scheduled IV Reglan. Continue w/ scopolamine patch.  Started on erythromycin for possible gastroparesis as nothing else has relieved the pt's symptoms. Continue on IVFs. Continue on PPI.  GI following and recs apprec UDS positive for opiates only  Constipation: S/p disimpaction for constipation.   Started MiraLAX pod  and Dulcolax 10 mg p.o. twice daily S/p Fleet enema 3/17 3/18 mineral oil enema x 1 Continue Dulcolax suppository as needed 3/18 AXR: Large stool burden is noted suggesting constipation. No abnormal bowel dilatation is noted  Hypokalemia: KCl repleated again. Will continue to monitor   Hypophosphatemia, phosphorus repleted.  Continue to monitor and replete as needed.  Exposed sutures of suprapubic catheter: suprapubic catheter replaced by IR 12/04/19. Resolved  DM2: uncontrolled with mild DKA. Continue on SSI w/ accuchecks. HbA1c 8.1  Hypertensive urgency: resolved. Continue on amlodipine, lasix & metoprolol. IV labetalol prn.  Peripheral neuropathy: continue on home dose of pregabalin   Obesity: BMI: 32.5. Would benefit from weight loss.  Transaminitis: resolved   DVT prophylaxis: lovenox Code Status: full  Family Communication:  Disposition Plan: Improving gradually, plan is to advance  diet today and monitor for tolerance.   Patient can be discharged home tomorrow if no nausea vomiting and  tolerating diet well. Discharged on laxatives to prevent constipation.    Consultants:  GI   Procedures: s/p EGD   Antimicrobials: erythromycin for possible gastroparesis    Subjective: No overnight issues, patient had  stopped vomiting since yesterday morning.  Patient had to 3 bowel movements, feels improvement in the nausea and vomiting.  Nuys any abdominal pain today.  Patient agreed to advance diet today.  Patient was advised to continue Dulcolax today. Patient agreed with the discharge planning tomorrow a.m. if remains stable.  Objective: Vitals:   12/10/19 0837 12/10/19 1550 12/11/19 0015 12/11/19 0923  BP: (!) 160/75 (!) 147/78 116/63 120/68  Pulse: (!) 59 65 60 61  Resp: 17 16 18 18   Temp: 98.4 F (36.9 C) 98.4 F (36.9 C) 98.6 F (37 C) 97.9 F (36.6 C)  TempSrc: Oral Oral  Oral  SpO2: 96%  96% 97%  Weight:      Height:        Intake/Output Summary (Last 24 hours) at 12/11/2019 1541 Last data filed at 12/11/2019 1414 Gross per 24 hour  Intake 2583.64 ml  Output 251 ml  Net 2332.64 ml   Filed Weights   12/03/19 0500 12/03/19 1700  Weight: 108.9 kg 108 kg    Examination:  General exam: Appears agitated  Respiratory system: Clear to auscultation. No wheezes, rales Cardiovascular system: S1 & S2 +. No rubs, gallops or clicks. Gastrointestinal system: Abdomen is obese, soft and nontender. Hypoactive bowel sounds heard. Central nervous system: Alert and oriented.  Psychiatry: Judgement and insight appear normal. Agitated and frustrated Extremities: Bilateral BKA   Data Reviewed: I have personally reviewed following labs and imaging studies  CBC: Recent Labs  Lab 12/06/19 0628 12/07/19 0622 12/08/19 0611 12/09/19 0421 12/11/19 0446  WBC 6.3 6.5 7.1 7.1 5.8  HGB 12.0* 12.6* 12.8* 12.7* 11.6*  HCT 35.0* 37.2* 36.6* 37.2* 33.8*  MCV 90.4 89.2 88.8 89.6 91.8  PLT 158 187 192 180 171   Basic Metabolic Panel: Recent Labs  Lab 12/06/19 0628 12/06/19 0628 12/07/19 0622 12/08/19 0611 12/09/19 0421 12/10/19 0415 12/11/19 0446  NA 140   < > 137 140 141 139 140  K 3.2*   < > 3.4* 3.1* 3.2* 3.2* 3.3*  CL 108   < > 107 110 110 108 109  CO2 24   < > 21* 23 24 24 25   GLUCOSE  175*   < > 196* 159* 157* 184* 166*  BUN 12   < > 13 10 9  5* 7  CREATININE 0.70   < > 0.73 0.74 0.79 0.60* 0.98  CALCIUM 8.6*   < > 8.4* 8.3* 8.5* 8.3* 8.0*  MG 2.0  --   --   --  1.8 1.8 2.0  PHOS  --   --   --   --  2.3* 2.8 2.9   < > = values in this interval not displayed.   GFR: Estimated Creatinine Clearance: 106.9 mL/min (by C-G formula based on SCr of 0.98 mg/dL). Liver Function Tests: Recent Labs  Lab 12/10/19 0415 12/11/19 0446  AST 100* 37  ALT 116* 76*  ALKPHOS 79 67  BILITOT 0.8 0.7  PROT 6.1* 5.4*  ALBUMIN 2.9* 2.7*   No results for input(s): LIPASE, AMYLASE in the last 168 hours. No results for input(s): AMMONIA in the last 168 hours. Coagulation Profile: No results for input(s): INR, PROTIME in the last 168 hours. Cardiac Enzymes: No results for input(s): CKTOTAL, CKMB, CKMBINDEX, TROPONINI in the  last 168 hours. BNP (last 3 results) No results for input(s): PROBNP in the last 8760 hours. HbA1C: No results for input(s): HGBA1C in the last 72 hours. CBG: Recent Labs  Lab 12/10/19 2015 12/11/19 0014 12/11/19 0416 12/11/19 0748 12/11/19 1154  GLUCAP 211* 133* 156* 155* 153*   Lipid Profile: No results for input(s): CHOL, HDL, LDLCALC, TRIG, CHOLHDL, LDLDIRECT in the last 72 hours. Thyroid Function Tests: No results for input(s): TSH, T4TOTAL, FREET4, T3FREE, THYROIDAB in the last 72 hours. Anemia Panel: No results for input(s): VITAMINB12, FOLATE, FERRITIN, TIBC, IRON, RETICCTPCT in the last 72 hours. Sepsis Labs: No results for input(s): PROCALCITON, LATICACIDVEN in the last 168 hours.  Recent Results (from the past 240 hour(s))  SARS CORONAVIRUS 2 (TAT 6-24 HRS) Nasopharyngeal Nasopharyngeal Swab     Status: None   Collection Time: 12/03/19  3:08 AM   Specimen: Nasopharyngeal Swab  Result Value Ref Range Status   SARS Coronavirus 2 NEGATIVE NEGATIVE Final    Comment: (NOTE) SARS-CoV-2 target nucleic acids are NOT DETECTED. The SARS-CoV-2 RNA  is generally detectable in upper and lower respiratory specimens during the acute phase of infection. Negative results do not preclude SARS-CoV-2 infection, do not rule out co-infections with other pathogens, and should not be used as the sole basis for treatment or other patient management decisions. Negative results must be combined with clinical observations, patient history, and epidemiological information. The expected result is Negative. Fact Sheet for Patients: HairSlick.nohttps://www.fda.gov/media/138098/download Fact Sheet for Healthcare Providers: quierodirigir.comhttps://www.fda.gov/media/138095/download This test is not yet approved or cleared by the [place] FDA and  has been authorized for detection and/or diagnosis of SARS-CoV-2 by FDA under an Emergency Use Authorization (EUA). This EUA will remain  in effect (meaning this test can be used) for the duration of the COVID-19 declaration under Section 56 4(b)(1) of the Act, 21 U.S.C. section 360bbb-3(b)(1), unless the authorization is terminated or revoked sooner. Performed at [facility], [address] [place], [place] [address]   MRSA PCR Screening     Status: Abnormal   Collection Time: 12/03/19 11:03 AM   Specimen: Nasopharyngeal  Result Value Ref Range Status   MRSA by PCR POSITIVE (A) NEGATIVE Final    Comment:        The GeneXpert MRSA Assay (FDA approved for NASAL specimens only), is one component of a comprehensive MRSA colonization surveillance program. It is not intended to diagnose MRSA infection nor to guide or monitor treatment for MRSA infections. RESULT CALLED TO, READ BACK BY AND VERIFIED WITH:  [facility] AT 1330 12/03/19 SDR Performed at [facility], [address] [place], [place] [address]          Radiology Studies: DG Abd 1 View  Result Date: 12/11/2019 CLINICAL DATA:  Constipation EXAM: ABDOMEN - 1 VIEW COMPARISON:  12/10/2019 FINDINGS: Nonobstructive bowel gas pattern. Mild to  moderate stool in the rectum. Otherwise normal colonic stool burden. Suprapubic bladder catheter. Thoracic spine fixation hardware with corpectomy and interbody spacer at T10. IMPRESSION: Mild to moderate rectal stool burden. Electronically Signed   By: Charline BillsSriyesh  Krishnan M.D.   On: 12/11/2019 09:14   DG Abd 1 View  Result Date: 12/10/2019 CLINICAL DATA:  Abdominal pain. EXAM: ABDOMEN - 1 VIEW COMPARISON:  December 05, 2019. FINDINGS: No abnormal bowel dilatation is noted. Large amount of stool seen throughout the colon. Suprapubic bladder catheter is again noted. No radio-opaque calculi or other significant radiographic abnormality are seen. IMPRESSION: Large stool burden is  noted suggesting constipation. No abnormal bowel dilatation is noted. Electronically Signed   By: Lupita Raider M.D.   On: 12/10/2019 08:44   ECHOCARDIOGRAM COMPLETE  Result Date: 12/10/2019    ECHOCARDIOGRAM REPORT   [name] Date of Exam: 12/10/2019 Medical Rec #:  [id number]      Height:       72.0 in Accession #:    [id number]     Weight:       238.1 lb Date of Birth:  Mar 30, 1961      BSA:          2.294 m Patient Age:    58 years       BP:           160/75 mmHg Patient Gender: M              HR:           59 bpm. 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                  Risk Factors:Diabetes.  Sonographer:     Cristela Blue RDCS (AE) Referring Phys:  [id number] Gillis Santa Diagnosing Phys: Alwyn Pea MD IMPRESSIONS  1. Left ventricular ejection fraction, by estimation, is 70 to 75%. The left ventricle has hyperdynamic function. The left ventricle has no regional wall motion abnormalities. There is mild left ventricular hypertrophy. Left ventricular diastolic parameters are consistent with Grade I diastolic dysfunction (impaired relaxation).  2. Right  ventricular systolic function is normal. The right ventricular size is normal.  3. The mitral valve is normal in structure. Trivial mitral valve regurgitation.  4. The aortic valve is normal in structure. Aortic valve regurgitation is not visualized. FINDINGS  Left Ventricle: Left ventricular ejection fraction, by estimation, is 70 to 75%. The left ventricle has hyperdynamic function. The left ventricle has no regional wall motion abnormalities. The left ventricular internal cavity size was normal in size. There is mild left ventricular hypertrophy. Left ventricular diastolic parameters are consistent with Grade I diastolic dysfunction (impaired relaxation). Right Ventricle: The right ventricular size is normal. No increase in right ventricular wall thickness. Right ventricular systolic function is normal. Left Atrium: Left atrial size was normal in size. Right Atrium: Right atrial size was normal in size. Pericardium: There is no evidence of pericardial effusion. Mitral Valve: The mitral valve is normal in structure. Trivial mitral valve regurgitation. Tricuspid Valve: The tricuspid valve is grossly normal. Tricuspid valve regurgitation is trivial. Aortic Valve: The aortic valve is normal in structure. Aortic valve regurgitation is not visualized. Aortic valve mean gradient measures 2.0 mmHg. Aortic valve peak gradient measures 3.1 mmHg. Aortic valve area, by VTI measures 5.28 cm. Pulmonic Valve: The pulmonic valve was normal in structure. Pulmonic valve regurgitation is not visualized. Aorta: The aortic root is normal in size and structure. IAS/Shunts: No atrial level shunt detected by color flow Doppler.  LEFT VENTRICLE PLAX 2D LVIDd:         5.15 cm  Diastology LVIDs:         2.89 cm  LV e' lateral:   7.29 cm/s LV PW:         1.38 cm  LV E/e' lateral: 8.2 LV IVS:  1.25 cm  LV e' medial:    4.24 cm/s LVOT diam:     2.40 cm  LV E/e' medial:  14.2 LV SV:         117 LV SV Index:   51 LVOT Area:     4.52 cm   RIGHT VENTRICLE RV Basal diam:  2.68 cm RV S prime:     11.90 cm/s TAPSE (M-mode): 2.8 cm LEFT ATRIUM           Index       RIGHT ATRIUM           Index LA diam:      3.10 cm 1.35 cm/m  RA Area:     12.60 cm LA Vol (A2C): 96.0 ml 41.84 ml/m RA Volume:   29.90 ml  13.03 ml/m LA Vol (A4C): 38.3 ml 16.69 ml/m  AORTIC VALVE                   PULMONIC VALVE AV Area (Vmax):    4.90 cm    PV Vmax:        0.90 m/s AV Area (Vmean):   4.86 cm    PV Peak grad:   3.2 mmHg AV Area (VTI):     5.28 cm    RVOT Peak grad: 3 mmHg AV Vmax:           88.25 cm/s AV Vmean:          63.150 cm/s AV VTI:            0.222 m AV Peak Grad:      3.1 mmHg AV Mean Grad:      2.0 mmHg LVOT Vmax:         95.60 cm/s LVOT Vmean:        67.900 cm/s LVOT VTI:          0.259 m LVOT/AV VTI ratio: 1.17  AORTA Ao Root diam: 3.70 cm MITRAL VALVE MV Area (PHT): 1.97 cm    SHUNTS MV Decel Time: 385 msec    Systemic VTI:  0.26 m MV E velocity: 60.10 cm/s  Systemic Diam: 2.40 cm MV A velocity: 80.50 cm/s MV E/A ratio:  0.75 Dwayne D Callwood MD Electronically signed by Alwyn Pea MD Signature Date/Time: 12/10/2019/1:00:41 PM    Final         Scheduled Meds: . amLODipine  10 mg Oral Daily  . bisacodyl  10 mg Oral BID  . Chlorhexidine Gluconate Cloth  6 each Topical Q0600  . cyanocobalamin  1,000 mcg Intramuscular Daily  . enoxaparin (LOVENOX) injection  40 mg Subcutaneous Q24H  . erythromycin  250 mg Oral Q8H  . hydrALAZINE  50 mg Oral TID  . insulin aspart  0-20 Units Subcutaneous Q4H  . insulin glargine  20 Units Subcutaneous QHS  . linaclotide  290 mcg Oral QAC breakfast  . loratadine  10 mg Oral Daily  . Melatonin  5 mg Oral QHS  . methocarbamol  1,000 mg Oral QID  . metoCLOPramide (REGLAN) injection  10 mg Intravenous Q6H  . metoprolol succinate  25 mg Oral Daily  . mineral oil  1 enema Rectal Once  . mupirocin ointment  1 application Nasal BID  . nicotine  21 mg Transdermal Daily  . oxyCODONE  10 mg Oral BID  .  pantoprazole (PROTONIX) IV  40 mg Intravenous Q12H  . polyethylene glycol  17 g Oral BID  . pregabalin  75 mg Oral BID  .  scopolamine  1 patch Transdermal Q72H  . sodium chloride flush  5 mL Intracatheter Q8H  . traZODone  50 mg Oral QHS   Continuous Infusions: . sodium chloride 100 mL/hr at 12/11/19 1250     LOS: 8 days    Time spent: 30 mins     Gillis Santa, MD [facility] Pager [phone]  If 7PM-7AM, please contact night-coverage [email or web address] 12/11/2019, 3:41 PM

## 2019-12-12 DIAGNOSIS — E1165 Type 2 diabetes mellitus with hyperglycemia: Secondary | ICD-10-CM

## 2019-12-12 DIAGNOSIS — E1143 Type 2 diabetes mellitus with diabetic autonomic (poly)neuropathy: Principal | ICD-10-CM

## 2019-12-12 DIAGNOSIS — Z89511 Acquired absence of right leg below knee: Secondary | ICD-10-CM

## 2019-12-12 DIAGNOSIS — K3184 Gastroparesis: Secondary | ICD-10-CM

## 2019-12-12 DIAGNOSIS — Z89512 Acquired absence of left leg below knee: Secondary | ICD-10-CM

## 2019-12-12 LAB — BASIC METABOLIC PANEL
Anion gap: 6 (ref 5–15)
BUN: 8 mg/dL (ref 6–20)
CO2: 23 mmol/L (ref 22–32)
Calcium: 8.3 mg/dL — ABNORMAL LOW (ref 8.9–10.3)
Chloride: 112 mmol/L — ABNORMAL HIGH (ref 98–111)
Creatinine, Ser: 0.79 mg/dL (ref 0.61–1.24)
GFR calc Af Amer: >60 mL/min (ref >60)
GFR calc non Af Amer: >60 mL/min (ref >60)
Glucose, Bld: 135 mg/dL — ABNORMAL HIGH (ref 70–99)
Potassium: 3.9 mmol/L (ref 3.5–5.1)
Sodium: 141 mmol/L (ref 135–145)

## 2019-12-12 LAB — CBC
HCT: 34.8 % — ABNORMAL LOW (ref 39.0–52.0)
Hemoglobin: 11.8 g/dL — ABNORMAL LOW (ref 13.0–17.0)
MCH: 31.1 pg (ref 26.0–34.0)
MCHC: 33.9 g/dL (ref 30.0–36.0)
MCV: 91.6 fL (ref 80.0–100.0)
Platelets: 164 10*3/uL (ref 150–400)
RBC: 3.8 MIL/uL — ABNORMAL LOW (ref 4.22–5.81)
RDW: 13.1 % (ref 11.5–15.5)
WBC: 4.6 10*3/uL (ref 4.0–10.5)
nRBC: 0 % (ref 0.0–0.2)

## 2019-12-12 LAB — HEPATIC FUNCTION PANEL
ALT: 76 U/L — ABNORMAL HIGH (ref 0–44)
AST: 37 U/L (ref 15–41)
Albumin: 2.8 g/dL — ABNORMAL LOW (ref 3.5–5.0)
Alkaline Phosphatase: 78 U/L (ref 38–126)
Bilirubin, Direct: <0.1 mg/dL (ref 0.0–0.2)
Total Bilirubin: 0.7 mg/dL (ref 0.3–1.2)
Total Protein: 5.7 g/dL — ABNORMAL LOW (ref 6.5–8.1)

## 2019-12-12 LAB — GLUCOSE, CAPILLARY
Glucose-Capillary: 129 mg/dL — ABNORMAL HIGH (ref 70–99)
Glucose-Capillary: 154 mg/dL — ABNORMAL HIGH (ref 70–99)
Glucose-Capillary: 171 mg/dL — ABNORMAL HIGH (ref 70–99)
Glucose-Capillary: 178 mg/dL — ABNORMAL HIGH (ref 70–99)
Glucose-Capillary: 178 mg/dL — ABNORMAL HIGH (ref 70–99)
Glucose-Capillary: 215 mg/dL — ABNORMAL HIGH (ref 70–99)
Glucose-Capillary: 225 mg/dL — ABNORMAL HIGH (ref 70–99)

## 2019-12-12 LAB — MAGNESIUM: Magnesium: 1.8 mg/dL (ref 1.7–2.4)

## 2019-12-12 LAB — PHOSPHORUS: Phosphorus: 3.6 mg/dL (ref 2.5–4.6)

## 2019-12-12 NOTE — Progress Notes (Signed)
PROGRESS NOTE    Eugene [name]  YHC:[id number] DOB: [birth year] DOA: 12/02/2019 PCP: Marco Collie, MD   Brief Narrative:  HPI was taken from Dr. Sidney Ace DanielProctoris a58 y.o.Caucasian malewho is a resident of [place] a known history oftype II diabetes mellitus, dyslipidemia, anxiety and depression as well as hepatitis C and irritable bowel syndrome, who presented to the emergency room with acute onset of intractable nausea and vomitingwith associated epigastric and periumbilical abdominal painsince 2 PM yesterday. He admitted to constipation. He has been having chills without tactile fever. He denied any bilious vomitus or hematemesis or diarrhea. No chest pain, dyspnea or cough or wheezing. No exposure to COVID-19. He has a suprapubic catheter and has not noticed any change in urine color or smell.  3/20: Patient seen and examined.  Diet advanced to soft.  Tolerating soft diet for 1-2 meals yesterday.  Still with some abdominal pain though improving.   Assessment & Plan:   Active Problems:   Intractable nausea and vomiting  Intractable nausea and vomiting Likely gastroparesis secondary to diabetes GI consulted, s/p EGD 12/07/19 which showed esophagitis,  biopsies were taken for testing for H. Pylori were done & path is pending.  GI signed off 3/18 Plan: Continue scheduled Reglan 5 mg 3 times daily AC Continue erythromycin for 14 days Continue twice daily PPI for 2 to 3 months Continue IVF for now Linzess 290 mcg daily Gastroparesis diet Soft diet If patient continues to tolerate p.o. without nausea or vomiting consider discharge on 12/13/2019  Constipation: S/p disimpaction for constipation.    S/p Fleet enema 3/17 3/18 mineral oil enema x 1 Continue Dulcolax suppository as needed 3/18 AXR: Large stool burden is noted suggesting constipation. No abnormal bowel dilatation is noted Plan: MiraLAX  Dulcolax 10 mg p.o.  twice daily  Hypokalemia: KCl repleated again. Will continue to monitor   Hypophosphatemia, phosphorus repleted.  Continue to monitor and replete as needed.  Exposed sutures of suprapubic catheter: suprapubic catheter replaced by IR 12/04/19. Resolved  DM2: uncontrolled with mild DKA. Continue on SSI w/ accuchecks. HbA1c 8.1  Hypertensive urgency: resolved. Continue on amlodipine, lasix & metoprolol. IV labetalol prn.  Peripheral neuropathy: continue on home dose of pregabalin   Obesity: BMI: 32.5. Would benefit from weight loss.  Transaminitis: resolved   DVT prophylaxis: Lovenox Code Status: Full Family Communication: None today Disposition Plan: Patient is gradually improving.  Plan to continue to monitor on soft diet today.  Patient tolerates soft diet through today plan to advance further as tolerated tomorrow and possibly plan for discharge.  Current barrier to discharge as tolerance of p.o. intake without abdominal pain nausea and vomiting  Consultants:   GI- signed off 3/18  Procedures:   EGD, 12/07/2019  Antimicrobials:   None   Subjective: Patient seen and examined No emesis, seems to be tolerating soft diet No pain complaints  Objective: Vitals:   12/11/19 1744 12/11/19 2016 12/12/19 0013 12/12/19 0911  BP: 124/67 (!) 108/49 129/63 134/69  Pulse: (!) 55 62 64 68  Resp: 17 16 17 18   Temp: 97.8 F (36.6 C) (!) 97.5 F (36.4 C) 98.1 F (36.7 C) 98.3 F (36.8 C)  TempSrc: Oral Oral  Oral  SpO2: 97% 96% 98% 98%  Weight:      Height:        Intake/Output Summary (Last 24 hours) at 12/12/2019 1412 Last data filed at 12/12/2019 1100 Gross per 24 hour  Intake 2013.75 ml  Output 2600  ml  Net -586.25 ml   Filed Weights   12/03/19 0500 12/03/19 1700  Weight: 108.9 kg 108 kg    Examination:  General exam: Appears calm and comfortable  Respiratory system: Clear to auscultation. Respiratory effort normal. Cardiovascular system: S1 & S2  heard, RRR. No JVD, murmurs, rubs, gallops or clicks. No pedal edema. Gastrointestinal system: Obese, soft, mild TTP epigastrium, hypoactive bowel sounds Central nervous system: Alert and oriented. No focal neurological deficits. Extremities: Bilateral BKA Skin: No rashes, lesions or ulcers Psychiatry: Judgement and insight appear normal. Mood & affect appropriate.     Data Reviewed: I have personally reviewed following labs and imaging studies  CBC: Recent Labs  Lab 12/07/19 0622 12/08/19 0611 12/09/19 0421 12/11/19 0446 12/12/19 0617  WBC 6.5 7.1 7.1 5.8 4.6  HGB 12.6* 12.8* 12.7* 11.6* 11.8*  HCT 37.2* 36.6* 37.2* 33.8* 34.8*  MCV 89.2 88.8 89.6 91.8 91.6  PLT 187 192 180 171 164   Basic Metabolic Panel: Recent Labs  Lab 12/06/19 0628 12/07/19 0622 12/08/19 0611 12/09/19 0421 12/10/19 0415 12/11/19 0446 12/12/19 0617  NA 140   < > 140 141 139 140 141  K 3.2*   < > 3.1* 3.2* 3.2* 3.3* 3.9  CL 108   < > 110 110 108 109 112*  CO2 24   < > 23 24 24 25 23   GLUCOSE 175*   < > 159* 157* 184* 166* 135*  BUN 12   < > 10 9 5* 7 8  CREATININE 0.70   < > 0.74 0.79 0.60* 0.98 0.79  CALCIUM 8.6*   < > 8.3* 8.5* 8.3* 8.0* 8.3*  MG 2.0  --   --  1.8 1.8 2.0 1.8  PHOS  --   --   --  2.3* 2.8 2.9 3.6   < > = values in this interval not displayed.   GFR: Estimated Creatinine Clearance: 131 mL/min (by C-G formula based on SCr of 0.79 mg/dL). Liver Function Tests: Recent Labs  Lab 12/10/19 0415 12/11/19 0446 12/12/19 0617  AST 100* 37 37  ALT 116* 76* 76*  ALKPHOS 79 67 78  BILITOT 0.8 0.7 0.7  PROT 6.1* 5.4* 5.7*  ALBUMIN 2.9* 2.7* 2.8*   No results for input(s): LIPASE, AMYLASE in the last 168 hours. No results for input(s): AMMONIA in the last 168 hours. Coagulation Profile: No results for input(s): INR, PROTIME in the last 168 hours. Cardiac Enzymes: No results for input(s): CKTOTAL, CKMB, CKMBINDEX, TROPONINI in the last 168 hours. BNP (last 3 results) No  results for input(s): PROBNP in the last 8760 hours. HbA1C: No results for input(s): HGBA1C in the last 72 hours. CBG: Recent Labs  Lab 12/11/19 2013 12/12/19 0012 12/12/19 0416 12/12/19 0833 12/12/19 1214  GLUCAP 235* 154* 129* 178* 215*   Lipid Profile: No results for input(s): CHOL, HDL, LDLCALC, TRIG, CHOLHDL, LDLDIRECT in the last 72 hours. Thyroid Function Tests: No results for input(s): TSH, T4TOTAL, FREET4, T3FREE, THYROIDAB in the last 72 hours. Anemia Panel: No results for input(s): VITAMINB12, FOLATE, FERRITIN, TIBC, IRON, RETICCTPCT in the last 72 hours. Sepsis Labs: No results for input(s): PROCALCITON, LATICACIDVEN in the last 168 hours.  Recent Results (from the past 240 hour(s))  SARS CORONAVIRUS 2 (TAT 6-24 HRS) Nasopharyngeal Nasopharyngeal Swab     Status: None   Collection Time: 12/03/19  3:08 AM   Specimen: Nasopharyngeal Swab  Result Value Ref Range Status   SARS Coronavirus 2 NEGATIVE NEGATIVE Final  Comment: (NOTE) SARS-CoV-2 target nucleic acids are NOT DETECTED. The SARS-CoV-2 RNA is generally detectable in upper and lower respiratory specimens during the acute phase of infection. Negative results do not preclude SARS-CoV-2 infection, do not rule out co-infections with other pathogens, and should not be used as the sole basis for treatment or other patient management decisions. Negative results must be combined with clinical observations, patient history, and epidemiological information. The expected result is Negative. Fact Sheet for Patients: HairSlick.no Fact Sheet for Healthcare Providers: quierodirigir.com This test is not yet approved or cleared by the [place] FDA and  has been authorized for detection and/or diagnosis of SARS-CoV-2 by FDA under an Emergency Use Authorization (EUA). This EUA will remain  in effect (meaning this test can be used) for the duration of the COVID-19  declaration under Section 56 4(b)(1) of the Act, 21 U.S.C. section 360bbb-3(b)(1), unless the authorization is terminated or revoked sooner. Performed at [facility], [address], [place], [place] [address]   MRSA PCR Screening     Status: Abnormal   Collection Time: 12/03/19 11:03 AM   Specimen: Nasopharyngeal  Result Value Ref Range Status   MRSA by PCR POSITIVE (A) NEGATIVE Final    Comment:        The GeneXpert MRSA Assay (FDA approved for NASAL specimens only), is one component of a comprehensive MRSA colonization surveillance program. It is not intended to diagnose MRSA infection nor to guide or monitor treatment for MRSA infections. RESULT CALLED TO, READ BACK BY AND VERIFIED WITH:  [facility] BIRDWELL AT 1330 12/03/19 SDR Performed at [facility], [address], [place], [place] [address]          Radiology Studies: DG Abd 1 View  Result Date: 12/11/2019 CLINICAL DATA:  Constipation EXAM: ABDOMEN - 1 VIEW COMPARISON:  12/10/2019 FINDINGS: Nonobstructive bowel gas pattern. Mild to moderate stool in the rectum. Otherwise normal colonic stool burden. Suprapubic bladder catheter. Thoracic spine fixation hardware with corpectomy and interbody spacer at T10. IMPRESSION: Mild to moderate rectal stool burden. Electronically Signed   By: Charline Bills M.D.   On: 12/11/2019 09:14        Scheduled Meds: . amLODipine  10 mg Oral Daily  . bisacodyl  10 mg Oral BID  . Chlorhexidine Gluconate Cloth  6 each Topical Q0600  . cyanocobalamin  1,000 mcg Intramuscular Daily  . enoxaparin (LOVENOX) injection  40 mg Subcutaneous Q24H  . erythromycin  250 mg Oral Q8H  . hydrALAZINE  50 mg Oral TID  . insulin aspart  0-20 Units Subcutaneous Q4H  . insulin glargine  20 Units Subcutaneous QHS  . linaclotide  290 mcg Oral QAC breakfast  . loratadine  10 mg Oral Daily  . Melatonin  5 mg Oral QHS  . methocarbamol  1,000 mg Oral QID  . metoCLOPramide (REGLAN)  injection  10 mg Intravenous Q6H  . metoprolol succinate  25 mg Oral Daily  . mineral oil  1 enema Rectal Once  . mupirocin ointment  1 application Nasal BID  . nicotine  21 mg Transdermal Daily  . oxyCODONE  10 mg Oral BID  . pantoprazole (PROTONIX) IV  40 mg Intravenous Q12H  . polyethylene glycol  17 g Oral BID  . pregabalin  75 mg Oral BID  . scopolamine  1 patch Transdermal Q72H  . sodium chloride flush  5 mL Intracatheter Q8H  . traZODone  50 mg Oral QHS   Continuous Infusions: . sodium  chloride 100 mL/hr at 12/12/19 1100     LOS: 9 days    Time spent: 35 minutes    Tresa Moore, MD [facility] Pager [phone]  If 7PM-7AM, please contact night-coverage 12/12/2019, 2:12 PM

## 2019-12-13 LAB — GLUCOSE, CAPILLARY
Glucose-Capillary: 120 mg/dL — ABNORMAL HIGH (ref 70–99)
Glucose-Capillary: 123 mg/dL — ABNORMAL HIGH (ref 70–99)
Glucose-Capillary: 185 mg/dL — ABNORMAL HIGH (ref 70–99)
Glucose-Capillary: 157 mg/dL — ABNORMAL HIGH (ref 70–99)
Glucose-Capillary: 153 mg/dL — ABNORMAL HIGH (ref 70–99)

## 2019-12-13 MED ORDER — METOCLOPRAMIDE HCL 5 MG PO TABS
10.0000 mg | ORAL_TABLET | Freq: Three times a day (TID) | ORAL | Status: DC
  Administered 2019-12-13 – 2019-12-14 (×5): 10 mg via ORAL
  Filled 2019-12-13 (×5): qty 2

## 2019-12-13 MED ORDER — FLEET ENEMA 7-19 GM/118ML RE ENEM
1.0000 | ENEMA | Freq: Once | RECTAL | Status: AC
  Administered 2019-12-13: 1 via RECTAL

## 2019-12-13 NOTE — Progress Notes (Signed)
PROGRESS NOTE    Eugene [name]  OZH:[id number] DOB: 04-21-1961 DOA: 12/02/2019 PCP: Abner Greenspan, MD   Brief Narrative:  HPI was taken from Dr. Arville Care DanielProctoris a58 y.o.Caucasian malewho is a resident of [facility],with a known history oftype II diabetes mellitus, dyslipidemia, anxiety and depression as well as hepatitis C and irritable bowel syndrome, who presented to the emergency room with acute onset of intractable nausea and vomitingwith associated epigastric and periumbilical abdominal painsince 2 PM yesterday. He admitted to constipation. He has been having chills without tactile fever. He denied any bilious vomitus or hematemesis or diarrhea. No chest pain, dyspnea or cough or wheezing. No exposure to COVID-19. He has a suprapubic catheter and has not noticed any change in urine color or smell.  3/20: Patient seen and examined.  Diet advanced to soft.  Tolerating soft diet for 1-2 meals yesterday.  Still with some abdominal pain though improving.  3/21: Patient seen and examined.  Seems to be tolerating soft diet although small amounts.  Still endorsing abdominal pain.  Worse after p.o. intake attempt.  Reports 3 days since last bowel movement.   Assessment & Plan:   Active Problems:   Intractable nausea and vomiting  Intractable nausea and vomiting Likely gastroparesis secondary to diabetes GI consulted, s/p EGD 12/07/19 which showed esophagitis,  biopsies were taken for testing for H. Pylori were done & path is pending.  GI signed off 3/18 3/21: Patient tolerating small amounts of soft diet with persistent abdominal pain.  Still endorsing constipation.  Last bowel movement 3 days ago per patient Plan: Continue Reglan, switch to 10 mg p.o. 3 times daily before meals and at bedtime Continue erythromycin for 14 days, 250 mg every 8 hours, started 12/08/2019 Continue twice daily PPI for 2 to 3 months DC intravenous fluids  Linzess 290 mcg daily Gastroparesis diet  Discharge medications will include: Reglan 10mg  PO TIDAC and HS Erythromycin 250mg  PO TID BID PPI Linzess QD  Constipation: S/p disimpaction for constipation.   S/p Fleet enema 3/17 3/18 mineral oil enema x 1 Continue Dulcolax suppository as needed 3/18 AXR: Large stool burden is noted suggesting constipation. No abnormal bowel dilatation is noted Plan: MiraLAX  Dulcolax 10 mg p.o. twice daily 3/21: Repeat Fleet enema.  Last bowel movement 3 days ago Patient has a successful bowel movement and tolerating gastroparesis diet anticipate he will be ready for discharge back to skilled nursing facility on 12/14/2019  Peripheral neuropathy: continue on home dose of pregabalin.  Patient is on long-acting and short acting opiates.  These are likely worsening his underlying gastroparesis.  Counseled patient, limit IV narcotics.  Hypokalemia: KCl repleated again. Will continue to monitor   Hypophosphatemia, phosphorus repleted.  Continue to monitor and replete as needed.  Exposed sutures of suprapubic catheter: suprapubic catheter replaced by IR 12/04/19. Resolved  DM2: uncontrolled with mild DKA. Continue on SSI w/ accuchecks. HbA1c 8.1  Hypertensive urgency: resolved. Continue on amlodipine, lasix & metoprolol. IV labetalol prn.    Obesity: BMI: 32.5. Would benefit from weight loss.  Transaminitis: resolved   DVT prophylaxis: Lovenox Code Status: Full Family Communication: None today Disposition Plan: Anticipate return back to previous skilled nursing facility.  Plan to advance to gastroparesis diet today.  Patient still with some abdominal pain.  He is on chronic narcotic therapy which likely is worsening underlying gastroparesis.  Current barriers to discharge include tolerance of p.o. intake without substantial abdominal pain and the bowel movement prior to  discharge.  Anticipate medical readiness for discharge to skilled  nursing facility on 12/14/2019.  Consultants:   GI- signed off 3/18  Procedures:   EGD, 12/07/2019  Antimicrobials:   None   Subjective: Patient seen and examined No emesis, seems to be tolerating soft diet No pain complaints  Objective: Vitals:   12/12/19 0013 12/12/19 0911 12/12/19 2342 12/13/19 0926  BP: 129/63 134/69 (!) 144/71 128/78  Pulse: 64 68 67 87  Resp: 17 18 16 14   Temp: 98.1 F (36.7 C) 98.3 F (36.8 C) 98.2 F (36.8 C) 97.8 F (36.6 C)  TempSrc:  Oral Oral Oral  SpO2: 98% 98% 94% 93%  Weight:      Height:        Intake/Output Summary (Last 24 hours) at 12/13/2019 1228 Last data filed at 12/13/2019 1137 Gross per 24 hour  Intake 2762.24 ml  Output 2975 ml  Net -212.76 ml   Filed Weights   12/03/19 0500 12/03/19 1700  Weight: 108.9 kg 108 kg    Examination:  General exam: Appears calm and comfortable  Respiratory system: Clear to auscultation. Respiratory effort normal. Cardiovascular system: S1 & S2 heard, RRR. No JVD, murmurs, rubs, gallops or clicks. No pedal edema. Gastrointestinal system: Obese, soft, mild TTP epigastrium, hypoactive bowel sounds Central nervous system: Alert and oriented. No focal neurological deficits. Extremities: Bilateral BKA Skin: No rashes, lesions or ulcers Psychiatry: Judgement and insight appear normal. Mood & affect appropriate.     Data Reviewed: I have personally reviewed following labs and imaging studies  CBC: Recent Labs  Lab 12/07/19 0622 12/08/19 0611 12/09/19 0421 12/11/19 0446 12/12/19 0617  WBC 6.5 7.1 7.1 5.8 4.6  HGB 12.6* 12.8* 12.7* 11.6* 11.8*  HCT 37.2* 36.6* 37.2* 33.8* 34.8*  MCV 89.2 88.8 89.6 91.8 91.6  PLT 187 192 180 171 164   Basic Metabolic Panel: Recent Labs  Lab 12/08/19 0611 12/09/19 0421 12/10/19 0415 12/11/19 0446 12/12/19 0617  NA 140 141 139 140 141  K 3.1* 3.2* 3.2* 3.3* 3.9  CL 110 110 108 109 112*  CO2 23 24 24 25 23   GLUCOSE 159* 157* 184* 166* 135*   BUN 10 9 5* 7 8  CREATININE 0.74 0.79 0.60* 0.98 0.79  CALCIUM 8.3* 8.5* 8.3* 8.0* 8.3*  MG  --  1.8 1.8 2.0 1.8  PHOS  --  2.3* 2.8 2.9 3.6   GFR: Estimated Creatinine Clearance: 131 mL/min (by C-G formula based on SCr of 0.79 mg/dL). Liver Function Tests: Recent Labs  Lab 12/10/19 0415 12/11/19 0446 12/12/19 0617  AST 100* 37 37  ALT 116* 76* 76*  ALKPHOS 79 67 78  BILITOT 0.8 0.7 0.7  PROT 6.1* 5.4* 5.7*  ALBUMIN 2.9* 2.7* 2.8*   No results for input(s): LIPASE, AMYLASE in the last 168 hours. No results for input(s): AMMONIA in the last 168 hours. Coagulation Profile: No results for input(s): INR, PROTIME in the last 168 hours. Cardiac Enzymes: No results for input(s): CKTOTAL, CKMB, CKMBINDEX, TROPONINI in the last 168 hours. BNP (last 3 results) No results for input(s): PROBNP in the last 8760 hours. HbA1C: No results for input(s): HGBA1C in the last 72 hours. CBG: Recent Labs  Lab 12/12/19 2013 12/12/19 2338 12/13/19 0403 12/13/19 0802 12/13/19 1139  GLUCAP 178* 225* 120* 123* 185*   Lipid Profile: No results for input(s): CHOL, HDL, LDLCALC, TRIG, CHOLHDL, LDLDIRECT in the last 72 hours. Thyroid Function Tests: No results for input(s): TSH, T4TOTAL, FREET4, T3FREE, THYROIDAB in  the last 72 hours. Anemia Panel: No results for input(s): VITAMINB12, FOLATE, FERRITIN, TIBC, IRON, RETICCTPCT in the last 72 hours. Sepsis Labs: No results for input(s): PROCALCITON, LATICACIDVEN in the last 168 hours.  No results found for this or any previous visit (from the past 240 hour(s)).       Radiology Studies: No results found.      Scheduled Meds: . amLODipine  10 mg Oral Daily  . bisacodyl  10 mg Oral BID  . cyanocobalamin  1,000 mcg Intramuscular Daily  . enoxaparin (LOVENOX) injection  40 mg Subcutaneous Q24H  . erythromycin  250 mg Oral Q8H  . hydrALAZINE  50 mg Oral TID  . insulin aspart  0-20 Units Subcutaneous Q4H  . insulin glargine  20 Units  Subcutaneous QHS  . linaclotide  290 mcg Oral QAC breakfast  . loratadine  10 mg Oral Daily  . Melatonin  5 mg Oral QHS  . methocarbamol  1,000 mg Oral QID  . metoCLOPramide (REGLAN) injection  10 mg Intravenous Q6H  . metoprolol succinate  25 mg Oral Daily  . mineral oil  1 enema Rectal Once  . nicotine  21 mg Transdermal Daily  . oxyCODONE  10 mg Oral BID  . pantoprazole (PROTONIX) IV  40 mg Intravenous Q12H  . polyethylene glycol  17 g Oral BID  . pregabalin  75 mg Oral BID  . scopolamine  1 patch Transdermal Q72H  . sodium chloride flush  5 mL Intracatheter Q8H  . sodium phosphate  1 enema Rectal Once  . traZODone  50 mg Oral QHS   Continuous Infusions:    LOS: 10 days    Time spent: 35 minutes    Sidney Ace, MD [facility] Pager [phone]  If 7PM-7AM, please contact night-coverage 12/13/2019, 12:28 PM

## 2019-12-14 DIAGNOSIS — G8929 Other chronic pain: Secondary | ICD-10-CM

## 2019-12-14 DIAGNOSIS — T83010D Breakdown (mechanical) of cystostomy catheter, subsequent encounter: Secondary | ICD-10-CM

## 2019-12-14 DIAGNOSIS — K5901 Slow transit constipation: Secondary | ICD-10-CM

## 2019-12-14 DIAGNOSIS — R52 Pain, unspecified: Secondary | ICD-10-CM

## 2019-12-14 DIAGNOSIS — T83010A Breakdown (mechanical) of cystostomy catheter, initial encounter: Secondary | ICD-10-CM

## 2019-12-14 LAB — GLUCOSE, CAPILLARY
Glucose-Capillary: 105 mg/dL — ABNORMAL HIGH (ref 70–99)
Glucose-Capillary: 115 mg/dL — ABNORMAL HIGH (ref 70–99)
Glucose-Capillary: 128 mg/dL — ABNORMAL HIGH (ref 70–99)
Glucose-Capillary: 131 mg/dL — ABNORMAL HIGH (ref 70–99)
Glucose-Capillary: 214 mg/dL — ABNORMAL HIGH (ref 70–99)

## 2019-12-14 MED ORDER — ERYTHROMYCIN BASE 250 MG PO TBEC: 250.0000 mg | DELAYED_RELEASE_TABLET | Freq: Three times a day (TID) | ORAL | 0 refills | Status: AC

## 2019-12-14 MED ORDER — PANTOPRAZOLE SODIUM 40 MG IV SOLR: 40.0000 mg | Freq: Two times a day (BID) | INTRAVENOUS | Status: DC

## 2019-12-14 MED ORDER — METOCLOPRAMIDE HCL 10 MG PO TABS: 10.0000 mg | ORAL_TABLET | Freq: Three times a day (TID) | ORAL | Status: DC

## 2019-12-14 NOTE — Discharge Summary (Signed)
Physician Discharge Summary  Eugene [name] RJJ:[id number] DOB: 01-Mar-1961 DOA: 12/02/2019  PCP: Abner Greenspan, MD  Admit date: 12/02/2019 Discharge date: 12/14/2019  Admitted From: SNF Disposition:  SNF  Recommendations for Outpatient Follow-up:  1. Follow up with PCP in 1-2 weeks 2. Please obtain BMP/CBC in one week 3. Please follow up on the following pending results:None  Home Health:No Equipment/Devices: None Discharge Condition: Stable CODE STATUS: Full Diet recommendation: Heart Healthy / Carb Modified   Brief/Interim Summary: DanielProctoris a59 y.o.Caucasian malewho is a resident of [facility],with a known history oftype II diabetes mellitus, dyslipidemia, anxiety and depression as well as hepatitis C and irritable bowel syndrome, who presented to the emergency room with acute onset of intractable nausea and vomitingwith associated epigastric and periumbilical abdominal pain. He also has an history of constipation and gastroparesis.  Has a chronic suprapubic catheter.  Admitted for exacerbation of gastroparesis and constipation requiring disimpaction.  Patient slowly able to tolerate some advancement in his diet.  No nausea or vomiting on the day of discharge.  Having bowel movements. He was discharged home to his facility with some changes to his Reglan, he will use it 3 times a day and at bedtime, erythromycin was added for 2 weeks and he was given a prescription for 10 more days.  He was also started on PPI twice daily which he will continue and follow-up with his PCP. He will continue with his bowel regimen along with Linzess and should avoid any constipation. EGD was done on 12/07/2019 which was negative for any inflammation or H. pylori.  Pathology with no malignancy.  He did had some electrolyte derangements including hypokalemia and hypophosphatemia which were repleted.  Patient has uncontrolled diabetes with A1c of 8.1.  Which was  managed with SSI during hospitalization and he needs to follow-up with PCP for better control.  He will continue rest of his home meds.  Discharge Diagnoses:  Active Problems:   Intractable nausea and vomiting   Suprapubic catheter dysfunction (HCC)   Pain   Constipation by delayed colonic transit  Discharge Instructions  Discharge Instructions    Diet - low sodium heart healthy   Complete by: As directed    Increase activity slowly   Complete by: As directed      Allergies as of 12/14/2019   No Known Allergies     Medication List    TAKE these medications   acetaminophen 325 MG tablet Commonly known as: TYLENOL Take 650 mg by mouth every 4 (four) hours as needed for mild pain or fever.   amLODipine 10 MG tablet Commonly known as: NORVASC Take 10 mg by mouth daily.   bisacodyl 10 MG suppository Commonly known as: DULCOLAX Place 10 mg rectally daily as needed for moderate constipation.   erythromycin 250 MG EC tablet Commonly known as: ERY-TAB Take 1 tablet (250 mg total) by mouth every 8 (eight) hours for 10 days.   famotidine 20 MG tablet Commonly known as: PEPCID Take 20 mg by mouth daily.   ferrous gluconate 324 MG tablet Commonly known as: FERGON Take 324 mg by mouth 2 (two) times daily with a meal.   furosemide 40 MG tablet Commonly known as: LASIX Take 40 mg by mouth 2 (two) times daily.   hydrALAZINE 50 MG tablet Commonly known as: APRESOLINE Take 50 mg by mouth 3 (three) times daily.   insulin glargine 100 UNIT/ML injection Commonly known as: LANTUS Inject 20 Units into the skin at bedtime.  insulin lispro 100 UNIT/ML injection Commonly known as: HUMALOG Inject 0-15 Units into the skin 4 (four) times daily -  before meals and at bedtime. <150= 0 units 151-200= 3 units 201-250= 6 units 251-300= 9 units 301-350= 12 units 351-400= 15 units >400 call MD   linaclotide 145 MCG Caps capsule Commonly known as: LINZESS Take 1 capsule (145 mcg  total) by mouth daily before breakfast.   loratadine 10 MG tablet Commonly known as: CLARITIN Take 10 mg by mouth daily.   Melatonin 5 MG Tabs Take 5 mg by mouth at bedtime.   methocarbamol 500 MG tablet Commonly known as: ROBAXIN Take 1,000 mg by mouth 4 (four) times daily.   metoCLOPramide 10 MG tablet Commonly known as: REGLAN Take 1 tablet (10 mg total) by mouth 4 (four) times daily -  before meals and at bedtime. What changed:   when to take this  reasons to take this   metoprolol succinate 25 MG 24 hr tablet Commonly known as: TOPROL-XL Take 25 mg by mouth daily.   nicotine 21 mg/24hr patch Commonly known as: NICODERM CQ - dosed in mg/24 hours Place 21 mg onto the skin daily.   ondansetron 4 MG tablet Commonly known as: ZOFRAN Take 4 mg by mouth every 6 (six) hours as needed for nausea or vomiting.   oxyCODONE ER 9 MG C12a Take 9 mg by mouth 2 (two) times daily.   Oxycodone HCl 10 MG Tabs Take 1 tablet (10 mg total) by mouth every 4 (four) hours as needed. What changed: reasons to take this   pantoprazole 40 MG injection Commonly known as: PROTONIX Inject 40 mg into the vein every 12 (twelve) hours.   polyethylene glycol 17 g packet Commonly known as: MIRALAX / GLYCOLAX Take 17 g by mouth 2 (two) times daily.   pregabalin 75 MG capsule Commonly known as: LYRICA Take 75 mg by mouth 2 (two) times daily.   senna-docusate 8.6-50 MG tablet Commonly known as: Senokot-S Take 1 tablet by mouth 2 (two) times daily.   sodium phosphate Pediatric 3.5-9.5 GM/59ML enema Place 1 enema rectally every other day as needed for severe constipation.   traZODone 50 MG tablet Commonly known as: DESYREL Take 50 mg by mouth at bedtime.       Contact information for follow-up providers    Sondra Come, MD. Call in 1 month(s).   Specialty: Urology Why: Please contact the office to schedule f/u and tube exchange.  Contact information: [address]  [place] [address] [phone]            Contact information for after-discharge care    Destination    [facility] Preferred SNF .   Service: Skilled Nursing Contact information: [address] [place] [address] [phone]                 No Known Allergies  Consultations: Gastroenterology-signed off 3/18.  Procedures/Studies: DG Abd 1 View  Result Date: 12/11/2019 CLINICAL DATA:  Constipation EXAM: ABDOMEN - 1 VIEW COMPARISON:  12/10/2019 FINDINGS: Nonobstructive bowel gas pattern. Mild to moderate stool in the rectum. Otherwise normal colonic stool burden. Suprapubic bladder catheter. Thoracic spine fixation hardware with corpectomy and interbody spacer at T10. IMPRESSION: Mild to moderate rectal stool burden. Electronically Signed   By: Charline Bills M.D.   On: 12/11/2019 09:14   DG Abd 1 View  Result Date: 12/10/2019 CLINICAL DATA:  Abdominal pain. EXAM: ABDOMEN - 1 VIEW COMPARISON:  December 05, 2019. FINDINGS: No abnormal bowel dilatation is noted. Large amount of stool seen throughout the colon. Suprapubic bladder catheter is again noted. No radio-opaque calculi or other significant radiographic abnormality are seen. IMPRESSION: Large stool burden is noted suggesting constipation. No abnormal bowel dilatation is noted. Electronically Signed   By: Lupita RaiderJames  Green Jr M.D.   On: 12/10/2019 08:44   CT ABDOMEN PELVIS W CONTRAST  Result Date: 12/03/2019 CLINICAL DATA:  59 year old male with abdominal pain, no bowel movement in 8 days. EXAM: CT ABDOMEN AND PELVIS WITH CONTRAST TECHNIQUE: Multidetector CT imaging of the abdomen and pelvis was performed using the standard protocol following bolus administration of intravenous contrast. CONTRAST:  100mL OMNIPAQUE IOHEXOL 300 MG/ML  SOLN COMPARISON:  CT Abdomen and Pelvis 10/21/2019. FINDINGS: Lower chest: Similar appearance of circumferential distal esophageal wall thickening. No lower  posterior mediastinal lymphadenopathy. No pericardial or pleural effusion. Mild right lower lobe lung scarring. Hepatobiliary: Gallbladder and liver within normal limits. No bile duct enlargement. Pancreas: Atrophied but otherwise negative. Spleen: Negative. Adrenals/Urinary Tract: Normal adrenal glands. Symmetric renal enhancement and contrast excretion. No nephrolithiasis identified. No hydronephrosis. There is a chronic suprapubic catheter and the urinary bladder is decompressed today. Stomach/Bowel: Persistent distal fecal impaction, not improved since January and with mildly increased distal sigmoid retained stool. There is distal sigmoid and rectal wall thickening and perirectal inflammatory stranding. Redundant sigmoid colon which loops in the right lower quadrant. The mid sigmoid is decompressed and without stool. Mild diverticulosis is noted. Retained stool continues however at the junction of the sigmoid and descending colon and throughout redundant left colon. Redundant transverse colon although more containing gas. The cecum is on a lax mesentery in the mid abdomen and distended with gas. No dilated small bowel. Decompressed stomach. Small fat containing umbilical hernia is stable. No free air. No free fluid. Vascular/Lymphatic: Mild aortoiliac calcified atherosclerosis. The major arterial structures appear patent. Portal venous system is patent. Reproductive: Negative. Other: No pelvic free fluid, but persistent and increased perirectal and presacral inflammatory stranding (series 2, image 76). Musculoskeletal: Lower thoracic spinal rods and previous T8-T9 corpectomy and posterior decompression. Chronic L1 compression fracture. No acute osseous abnormality identified. IMPRESSION: 1. Progressed distal Fecal Impaction since January with associated Acute Proctitis / Stercoral Colitis. 2. Redundant large bowel, and substantial retained stool also in the left colon. But no dilated small bowel. 3. Stable  suprapubic catheter, decompressed urinary bladder. Electronically Signed   By: Odessa FlemingH  Hall M.D.   On: 12/03/2019 00:07   IR Catheter Tube Change  Result Date: 12/04/2019 INDICATION: History of neurogenic bladder with chronic suprapubic catheter. Patient presents today for fluoroscopic guided exchange conversion of existing 4114 French all-purpose drainage catheter to a 16 French balloon retention Council catheter. EXAM: FLUOROSCOPIC GUIDED SUPRAPUBIC CATHETER EXCHANGE COMPARISON:  Image guided suprapubic catheter placement - 10/07/2019 MEDICATIONS: None ANESTHESIA/SEDATION: None CONTRAST:  None FLUOROSCOPY TIME:  30 seconds (5.8 mGy) COMPLICATIONS: None immediate. PROCEDURE: The procedure, risks, benefits, and alternatives were explained to the patient. Questions regarding the procedure were encouraged and answered. The patient understands and consents to the procedure. A timeout was performed prior to the initiation of the procedure. The external portion of the existing suprapubic catheter as well as the surrounding skin were prepped and draped in usual sterile fashion A preprocedural spot fluoroscopic image was obtained of the lower pelvis and existing 14 French all-purpose drainage catheter with end coiled and locked overlying the expected location of the urinary bladder. Contrast injection confirmed appropriate positioning  and functionality existing suprapubic catheter. The external portion of the existing suprapubic catheter was cut and cannulated with a short Amplatz wire which was coiled within the urinary bladder. Under intermittent fluoroscopic guidance, the track was dilated ultimately allowing placement of a new 16 Fr balloon retention Council catheter. Appropriate positioning was confirmed with the injection of a small amount of contrast as well as the efflux of urine. The catheter was connected to a gravity bag. A dressing was placed. The patient tolerated the procedure well without immediate  postprocedural complication. FINDINGS: Preprocedural imaging demonstrates unchanged positioning of all-purpose drainage catheter with end coiled and locked over the expected location of the urinary bladder. Contrast injection confirms appropriate positioning and functionality of the existing percutaneous drainage catheter. After fluoroscopic guided exchange, a new 16 Fr balloon retention Council catheter is appropriately positioned within the urinary bladder. IMPRESSION: Technically successful fluoroscopic guided exchange, upsize and conversion to a 16 [place], balloon retention Council catheter for the purposes of chronic urinary bladder decompression. Electronically Signed   By: Simonne Come M.D.   On: 12/04/2019 15:03   DG Abd Portable 1V  Result Date: 12/05/2019 CLINICAL DATA:  Suprapubic catheter change, nausea and vomiting, last bowel movement 2 days ago EXAM: PORTABLE ABDOMEN - 1 VIEW COMPARISON:  CT abdomen pelvis, 12/02/2019 FINDINGS: Diffusely gas-filled small bowel and colon gas present to the rectum. Moderate burden of stool throughout the colon. Suprapubic catheter. Osseous structures are unremarkable. IMPRESSION: 1. Diffusely gas-filled small bowel and colon with gas present to the rectum. Findings may reflect ileus. Moderate burden of stool throughout the colon. 2.  Suprapubic catheter projects in expected location. Electronically Signed   By: Lauralyn Primes M.D.   On: 12/05/2019 18:47   ECHOCARDIOGRAM COMPLETE  Result Date: 12/10/2019    ECHOCARDIOGRAM REPORT   [name] Date of Exam: 12/10/2019 Medical Rec #:  [id number]      Height:       72.0 in Accession #:    [id number]     Weight:       238.1 lb Date of Birth:  July 11, 1961      BSA:          2.294 m Patient Age:    58 years       BP:           160/75 mmHg Patient Gender: M              HR:           59 bpm. 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                  Risk Factors:Diabetes.  Sonographer:     Cristela Blue RDCS (AE) Referring Phys:  [id number] Gillis Santa Diagnosing Phys: Alwyn Pea MD IMPRESSIONS  1. Left ventricular ejection fraction, by estimation, is 70 to 75%. The left ventricle has hyperdynamic function. The left ventricle has no regional wall motion abnormalities. There is mild left ventricular hypertrophy. Left ventricular diastolic parameters are consistent with Grade I diastolic dysfunction (impaired relaxation).  2. Right ventricular systolic function is normal. The right ventricular size is normal.  3. The mitral valve is normal in structure. Trivial mitral valve regurgitation.  4. The aortic valve is normal in structure. Aortic  valve regurgitation is not visualized. FINDINGS  Left Ventricle: Left ventricular ejection fraction, by estimation, is 70 to 75%. The left ventricle has hyperdynamic function. The left ventricle has no regional wall motion abnormalities. The left ventricular internal cavity size was normal in size. There is mild left ventricular hypertrophy. Left ventricular diastolic parameters are consistent with Grade I diastolic dysfunction (impaired relaxation). Right Ventricle: The right ventricular size is normal. No increase in right ventricular wall thickness. Right ventricular systolic function is normal. Left Atrium: Left atrial size was normal in size. Right Atrium: Right atrial size was normal in size. Pericardium: There is no evidence of pericardial effusion. Mitral Valve: The mitral valve is normal in structure. Trivial mitral valve regurgitation. Tricuspid Valve: The tricuspid valve is grossly normal. Tricuspid valve regurgitation is trivial. Aortic Valve: The aortic valve is normal in structure. Aortic valve regurgitation is not visualized. Aortic valve mean gradient measures 2.0 mmHg. Aortic valve peak gradient measures 3.1 mmHg.  Aortic valve area, by VTI measures 5.28 cm. Pulmonic Valve: The pulmonic valve was normal in structure. Pulmonic valve regurgitation is not visualized. Aorta: The aortic root is normal in size and structure. IAS/Shunts: No atrial level shunt detected by color flow Doppler.  LEFT VENTRICLE PLAX 2D LVIDd:         5.15 cm  Diastology LVIDs:         2.89 cm  LV e' lateral:   7.29 cm/s LV PW:         1.38 cm  LV E/e' lateral: 8.2 LV IVS:        1.25 cm  LV e' medial:    4.24 cm/s LVOT diam:     2.40 cm  LV E/e' medial:  14.2 LV SV:         117 LV SV Index:   51 LVOT Area:     4.52 cm  RIGHT VENTRICLE RV Basal diam:  2.68 cm RV S prime:     11.90 cm/s TAPSE (M-mode): 2.8 cm LEFT ATRIUM           Index       RIGHT ATRIUM           Index LA diam:      3.10 cm 1.35 cm/m  RA Area:     12.60 cm LA Vol (A2C): 96.0 ml 41.84 ml/m RA Volume:   29.90 ml  13.03 ml/m LA Vol (A4C): 38.3 ml 16.69 ml/m  AORTIC VALVE                   PULMONIC VALVE AV Area (Vmax):    4.90 cm    PV Vmax:        0.90 m/s AV Area (Vmean):   4.86 cm    PV Peak grad:   3.2 mmHg AV Area (VTI):     5.28 cm    RVOT Peak grad: 3 mmHg AV Vmax:           88.25 cm/s AV Vmean:          63.150 cm/s AV VTI:            0.222 m AV Peak Grad:      3.1 mmHg AV Mean Grad:      2.0 mmHg LVOT Vmax:         95.60 cm/s LVOT Vmean:        67.900 cm/s LVOT VTI:          0.259 m LVOT/AV VTI ratio: 1.17  AORTA Ao Root diam: 3.70 cm MITRAL VALVE MV Area (PHT): 1.97 cm    SHUNTS MV Decel Time: 385 msec    Systemic VTI:  0.26 m MV E velocity: 60.10 cm/s  Systemic Diam: 2.40 cm MV A velocity: 80.50 cm/s MV E/A ratio:  0.75 Dwayne D Callwood MD Electronically signed by Alwyn Pea MD Signature Date/Time: 12/10/2019/1:00:41 PM    Final      Subjective: Patient was feeling better when seen during morning rounds.  No complaints.  He was able to eat and having bowel movements.  Abdominal pain seems improving.  Discharge Exam: Vitals:   12/14/19 0037 12/14/19 0914   BP: (!) 142/74 (!) 138/91  Pulse: 69 70  Resp: 14 18  Temp: 98.4 F (36.9 C) 97.6 F (36.4 C)  SpO2: 95% 93%   Vitals:   12/13/19 0926 12/13/19 1615 12/14/19 0037 12/14/19 0914  BP: 128/78 123/67 (!) 142/74 (!) 138/91  Pulse: 87 66 69 70  Resp: 14 17 14 18   Temp: 97.8 F (36.6 C) 98.6 F (37 C) 98.4 F (36.9 C) 97.6 F (36.4 C)  TempSrc: Oral Oral Oral Oral  SpO2: 93% 94% 95% 93%  Weight:      Height:        General: Pt is alert, awake, not in acute distress Cardiovascular: RRR, S1/S2 +, no rubs, no gallops Respiratory: CTA bilaterally, no wheezing, no rhonchi Abdominal: Soft, NT, ND, bowel sounds + Extremities: Bilateral BKA, 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    Microbiology: No results found for this or any previous visit (from the past 240 hour(s)).   Labs: BNP (last 3 results) No results for input(s): BNP in the last 8760 hours. Basic Metabolic Panel: Recent Labs  Lab 12/08/19 0611 12/09/19 0421 12/10/19 0415 12/11/19 0446 12/12/19 0617  NA 140 141 139 140 141  K 3.1* 3.2* 3.2* 3.3* 3.9  CL 110 110 108 109 112*  CO2 23 24 24 25 23   GLUCOSE 159* 157* 184* 166* 135*  BUN 10 9 5* 7 8  CREATININE 0.74 0.79 0.60* 0.98 0.79  CALCIUM 8.3* 8.5* 8.3* 8.0* 8.3*  MG  --  1.8 1.8 2.0 1.8  PHOS  --  2.3* 2.8 2.9 3.6   Liver Function Tests: Recent Labs  Lab 12/10/19 0415 12/11/19 0446 12/12/19 0617  AST 100* 37 37  ALT 116* 76* 76*  ALKPHOS 79 67 78  BILITOT 0.8 0.7 0.7  PROT 6.1* 5.4* 5.7*  ALBUMIN 2.9* 2.7* 2.8*   No results for input(s): LIPASE, AMYLASE in the last 168 hours. No results for input(s): AMMONIA in the last 168 hours. CBC: Recent Labs  Lab 12/08/19 0611 12/09/19 0421 12/11/19 0446 12/12/19 0617  WBC 7.1 7.1 5.8 4.6  HGB 12.8* 12.7* 11.6* 11.8*  HCT 36.6* 37.2* 33.8* 34.8*  MCV 88.8 89.6 91.8  91.6  PLT 192 180 171 164   Cardiac Enzymes: No results for input(s): CKTOTAL, CKMB, CKMBINDEX, TROPONINI in the last 168 hours. BNP: Invalid input(s): POCBNP CBG: Recent Labs  Lab 12/13/19 2028 12/14/19 0038 12/14/19 0423 12/14/19 0748 12/14/19 1149  GLUCAP 153* 131* 128* 105* 115*   D-Dimer No results for input(s): DDIMER in the last 72 hours. Hgb A1c No results for input(s): HGBA1C in the last 72 hours. Lipid Profile No results for input(s): CHOL, HDL, LDLCALC, TRIG, CHOLHDL, LDLDIRECT in the last 72 hours. Thyroid  function studies No results for input(s): TSH, T4TOTAL, T3FREE, THYROIDAB in the last 72 hours.  Invalid input(s): FREET3 Anemia work up No results for input(s): VITAMINB12, FOLATE, FERRITIN, TIBC, IRON, RETICCTPCT in the last 72 hours. Urinalysis    Component Value Date/Time   COLORURINE YELLOW (A) 12/02/2019 2131   APPEARANCEUR CLOUDY (A) 12/02/2019 2131   LABSPEC 1.032 (H) 12/02/2019 2131   PHURINE 8.0 12/02/2019 2131   GLUCOSEU >=500 (A) 12/02/2019 2131   HGBUR SMALL (A) 12/02/2019 2131   BILIRUBINUR NEGATIVE 12/02/2019 2131   KETONESUR 20 (A) 12/02/2019 2131   PROTEINUR >=300 (A) 12/02/2019 2131   NITRITE NEGATIVE 12/02/2019 2131   LEUKOCYTESUR SMALL (A) 12/02/2019 2131   Sepsis Labs Invalid input(s): PROCALCITONIN,  WBC,  LACTICIDVEN Microbiology No results found for this or any previous visit (from the past 240 hour(s)).  Time coordinating discharge: Over 30 minutes  SIGNED:  Arnetha Courser, MD  [facility] 12/14/2019, 1:36 PM  If 7PM-7AM, please contact night-coverage [email or web address]  This record has been created using Conservation officer, historic buildings. Errors have been sought and corrected,but may not always be located. Such creation errors do not reflect on the standard of care.

## 2019-12-14 NOTE — TOC Transition Note (Signed)
Transition of Care [facility]) - CM/SW Discharge Note   Patient Details  Name: Eugene [name] MRN: [id number] Date of Birth: [birth year]  [facility] of [facility] CM/SW Contact:  Allayne Butcher, RN Phone Number: 12/14/2019, 2:04 PM   Clinical Narrative:    Patient is medically cleared for discharge back to [facility].  Patient will go to room 67A and will transport via [facility].  Bedside RN will call report to [phone].  This RNCM will set up EMS transport.    Final next level of care: Skilled Nursing Facility Barriers to Discharge: Barriers Resolved   Patient Goals and CMS Choice Patient states their goals for this hospitalization and ongoing recovery are:: Get better, get out of the hospital      Discharge Placement              Patient chooses bed at: [facility] Patient to be transferred to facility by: [facility] Name of family member notified: Patient notified Patient and family notified of of transfer: 12/14/19  Discharge Plan and Services   Discharge Planning Services: CM Consult Post Acute Care Choice: Skilled Nursing Facility                               Social Determinants of Health (SDOH) Interventions     Readmission Risk Interventions Readmission Risk Prevention Plan 10/05/2019 03/04/2019  Transportation Screening Complete Complete  Medication Review [name]) Complete Complete  PCP or Specialist appointment within 3-5 days of discharge Complete -  HRI or Home Care Consult Complete -  SW Recovery Care/Counseling Consult Complete -  Palliative Care Screening Not Applicable Not Applicable  Skilled Nursing Facility Complete Complete

## 2019-12-31 ENCOUNTER — Other Ambulatory Visit: Payer: Self-pay

## 2019-12-31 ENCOUNTER — Encounter: Payer: Self-pay | Admitting: Gastroenterology

## 2019-12-31 ENCOUNTER — Ambulatory Visit (INDEPENDENT_AMBULATORY_CARE_PROVIDER_SITE_OTHER): Payer: Medicaid Other | Admitting: Gastroenterology

## 2019-12-31 VITALS — BP 114/67 | HR 76 | Temp 98.9°F | Wt 250.0 lb

## 2019-12-31 DIAGNOSIS — Z1211 Encounter for screening for malignant neoplasm of colon: Secondary | ICD-10-CM

## 2019-12-31 DIAGNOSIS — K5901 Slow transit constipation: Secondary | ICD-10-CM

## 2019-12-31 DIAGNOSIS — R112 Nausea with vomiting, unspecified: Secondary | ICD-10-CM | POA: Diagnosis not present

## 2019-12-31 NOTE — H&P (View-Only) (Signed)
° ° °Primary Care Physician: Hodges, Beth, MD ° °Primary Gastroenterologist:  Dr. Ethelmae Ringel ° °Chief Complaint  °Patient presents with  °• Follow up GI bleed  ° ° °HPI: Eugene [name] is a 59 y.o. male here for follow-up after being discharged from the hospital.  The patient had previously been followed by [facility] and has had an upper endoscopy by them.  The patient was then in the hospital in March of this year with intractable nausea and vomiting.  The patient was thought to have diabetic gastroparesis and an EGD showed him to have esophagitis.  The patient was recommended to start Linzess for his chronic constipation and he was treated with carafate a PPI and recommended to try erythromycin. °The patient reports that he has not had any further attacks of his gastroparesis with nausea and vomiting.  He is taking the Linzess but it turns out that he is only on the 145 mcg/day and states that he still has to use suppositories to move his bowels.  The patient has gone up to 5 days without a bowel movement.  He denies any black stools or bloody stools.  He also has denies any abdominal pain.  He has not had a screening colonoscopy that he can remember. ° °Past Medical History:  °Diagnosis Date  °• Anxiety   °• Depression   °• Diabetes mellitus without complication (HCC)   °• Hepatitis C   °• Hyperlipidemia   °• IBS (irritable bowel syndrome)   °• Osteomyelitis (HCC)   °• Spinal stenosis   ° ° °Current Outpatient Medications  °Medication Sig Dispense Refill  °• acetaminophen (TYLENOL) 325 MG tablet Take 650 mg by mouth every 4 (four) hours as needed for mild pain or fever.    °• amLODipine (NORVASC) 10 MG tablet Take 10 mg by mouth daily.    °• bisacodyl (DULCOLAX) 10 MG suppository Place 10 mg rectally daily as needed for moderate constipation.    °• famotidine (PEPCID) 20 MG tablet Take 20 mg by mouth daily.     °• ferrous gluconate (FERGON) 324 MG tablet Take 324 mg by mouth 2 (two) times daily with  a meal.     °• furosemide (LASIX) 40 MG tablet Take 40 mg by mouth 2 (two) times daily.    °• hydrALAZINE (APRESOLINE) 50 MG tablet Take 50 mg by mouth 3 (three) times daily.    °• insulin glargine (LANTUS) 100 UNIT/ML injection Inject 20 Units into the skin at bedtime.     °• insulin lispro (HUMALOG) 100 UNIT/ML injection Inject 0-15 Units into the skin 4 (four) times daily -  before meals and at bedtime. <150= 0 units °151-200= 3 units °201-250= 6 units °251-300= 9 units °301-350= 12 units °351-400= 15 units °>400 call MD    °• linaclotide (LINZESS) 145 MCG CAPS capsule Take 1 capsule (145 mcg total) by mouth daily before breakfast. 30 capsule 0  °• loratadine (CLARITIN) 10 MG tablet Take 10 mg by mouth daily.    °• Melatonin 5 MG TABS Take 5 mg by mouth at bedtime.    °• methocarbamol (ROBAXIN) 500 MG tablet Take 1,000 mg by mouth 4 (four) times daily.     °• metoCLOPramide (REGLAN) 10 MG tablet Take 1 tablet (10 mg total) by mouth 4 (four) times daily -  before meals and at bedtime.    °• metoprolol succinate (TOPROL-XL) 25 MG 24 hr tablet Take 25 mg by mouth daily.    °• nicotine (NICODERM   CQ - DOSED IN MG/24 HOURS) 21 mg/24hr patch Place 21 mg onto the skin daily.    °• ondansetron (ZOFRAN) 4 MG tablet Take 4 mg by mouth every 6 (six) hours as needed for nausea or vomiting.    °• oxyCODONE ER 9 MG C12A Take 9 mg by mouth 2 (two) times daily. 6 capsule 0  °• Oxycodone HCl 10 MG TABS Take 1 tablet (10 mg total) by mouth every 4 (four) hours as needed. (Patient taking differently: Take 10 mg by mouth every 4 (four) hours as needed (pain). ) 10 tablet 0  °• pantoprazole (PROTONIX) 40 MG injection Inject 40 mg into the vein every 12 (twelve) hours. 1 each   °• polyethylene glycol (MIRALAX / GLYCOLAX) 17 g packet Take 17 g by mouth 2 (two) times daily. 14 each 0  °• pregabalin (LYRICA) 75 MG capsule Take 75 mg by mouth 2 (two) times daily.    °• senna-docusate (SENOKOT-S) 8.6-50 MG tablet Take 1 tablet by mouth 2  (two) times daily.    °• sodium phosphate Pediatric (FLEET) 3.5-9.5 GM/59ML enema Place 1 enema rectally every other day as needed for severe constipation.    °• traZODone (DESYREL) 50 MG tablet Take 50 mg by mouth at bedtime.     ° °No current facility-administered medications for this visit.  ° ° °Allergies as of 12/31/2019  °• (No Known Allergies)  ° ° °ROS: ° °General: Negative for anorexia, weight loss, fever, chills, fatigue, weakness. °ENT: Negative for hoarseness, difficulty swallowing , nasal congestion. °CV: Negative for chest pain, angina, palpitations, dyspnea on exertion, peripheral edema.  °Respiratory: Negative for dyspnea at rest, dyspnea on exertion, cough, sputum, wheezing.  °GI: See history of present illness. °GU:  Negative for dysuria, hematuria, urinary incontinence, urinary frequency, nocturnal urination.  °Endo: Negative for unusual weight change.  °  °Physical Examination: ° ° BP 114/67    Pulse 76    Temp 98.9 °F (37.2 °C) (Oral)    Wt 250 lb (113.4 kg) Comment: PT REPORTED. WHEELCHAIR BOUND   BMI 33.91 kg/m²  ° °General: Well-nourished, well-developed in no acute distress.  °Eyes: No icterus. Conjunctivae pink. °Lungs: Clear to auscultation bilaterally. Non-labored. °Heart: Regular rate and rhythm, no murmurs rubs or gallops.  °Abdomen: Bowel sounds are normal, nontender, nondistended, no hepatosplenomegaly or masses, no abdominal bruits or hernia , no rebound or guarding.   °Extremities: Bilateral BKA °Neuro: Alert and oriented x 3.  Grossly intact. °Skin: Warm and dry, no jaundice.   °Psych: Alert and cooperative, normal mood and affect. ° °Labs:  °  °Imaging Studies: °DG Abd 1 View ° °Result Date: 12/11/2019 °CLINICAL DATA:  Constipation EXAM: ABDOMEN - 1 VIEW COMPARISON:  12/10/2019 FINDINGS: Nonobstructive bowel gas pattern. Mild to moderate stool in the rectum. Otherwise normal colonic stool burden. Suprapubic bladder catheter. Thoracic spine fixation hardware with corpectomy and  interbody spacer at T10. IMPRESSION: Mild to moderate rectal stool burden. Electronically Signed   By: Sriyesh  Krishnan M.D.   On: 12/11/2019 09:14  ° °DG Abd 1 View ° °Result Date: 12/10/2019 °CLINICAL DATA:  Abdominal pain. EXAM: ABDOMEN - 1 VIEW COMPARISON:  December 05, 2019. FINDINGS: No abnormal bowel dilatation is noted. Large amount of stool seen throughout the colon. Suprapubic bladder catheter is again noted. No radio-opaque calculi or other significant radiographic abnormality are seen. IMPRESSION: Large stool burden is noted suggesting constipation. No abnormal bowel dilatation is noted. Electronically Signed   By: James  Green Jr M.D.     On: 12/10/2019 08:44  ° °CT ABDOMEN PELVIS W CONTRAST ° °Result Date: 12/03/2019 °CLINICAL DATA:  58-year-old male with abdominal pain, no bowel movement in 8 days. EXAM: CT ABDOMEN AND PELVIS WITH CONTRAST TECHNIQUE: Multidetector CT imaging of the abdomen and pelvis was performed using the standard protocol following bolus administration of intravenous contrast. CONTRAST:  100mL OMNIPAQUE IOHEXOL 300 MG/ML  SOLN COMPARISON:  CT Abdomen and Pelvis 10/21/2019. FINDINGS: Lower chest: Similar appearance of circumferential distal esophageal wall thickening. No lower posterior mediastinal lymphadenopathy. No pericardial or pleural effusion. Mild right lower lobe lung scarring. Hepatobiliary: Gallbladder and liver within normal limits. No bile duct enlargement. Pancreas: Atrophied but otherwise negative. Spleen: Negative. Adrenals/Urinary Tract: Normal adrenal glands. Symmetric renal enhancement and contrast excretion. No nephrolithiasis identified. No hydronephrosis. There is a chronic suprapubic catheter and the urinary bladder is decompressed today. Stomach/Bowel: Persistent distal fecal impaction, not improved since January and with mildly increased distal sigmoid retained stool. There is distal sigmoid and rectal wall thickening and perirectal inflammatory stranding.  Redundant sigmoid colon which loops in the right lower quadrant. The mid sigmoid is decompressed and without stool. Mild diverticulosis is noted. Retained stool continues however at the junction of the sigmoid and descending colon and throughout redundant left colon. Redundant transverse colon although more containing gas. The cecum is on a lax mesentery in the mid abdomen and distended with gas. No dilated small bowel. Decompressed stomach. Small fat containing umbilical hernia is stable. No free air. No free fluid. Vascular/Lymphatic: Mild aortoiliac calcified atherosclerosis. The major arterial structures appear patent. Portal venous system is patent. Reproductive: Negative. Other: No pelvic free fluid, but persistent and increased perirectal and presacral inflammatory stranding (series 2, image 76). Musculoskeletal: Lower thoracic spinal rods and previous T8-T9 corpectomy and posterior decompression. Chronic L1 compression fracture. No acute osseous abnormality identified. IMPRESSION: 1. Progressed distal Fecal Impaction since January with associated Acute Proctitis / Stercoral Colitis. 2. Redundant large bowel, and substantial retained stool also in the left colon. But no dilated small bowel. 3. Stable suprapubic catheter, decompressed urinary bladder. Electronically Signed   By: H  Hall M.D.   On: 12/03/2019 00:07  ° °IR Catheter Tube Change ° °Result Date: 12/04/2019 °INDICATION: History of neurogenic bladder with chronic suprapubic catheter. Patient presents today for fluoroscopic guided exchange conversion of existing 14 French all-purpose drainage catheter to a 16 French balloon retention Council catheter. EXAM: FLUOROSCOPIC GUIDED SUPRAPUBIC CATHETER EXCHANGE COMPARISON:  Image guided suprapubic catheter placement - 10/07/2019 MEDICATIONS: None ANESTHESIA/SEDATION: None CONTRAST:  None FLUOROSCOPY TIME:  30 seconds (5.8 mGy) COMPLICATIONS: None immediate. PROCEDURE: The procedure, risks, benefits, and  alternatives were explained to the patient. Questions regarding the procedure were encouraged and answered. The patient understands and consents to the procedure. A timeout was performed prior to the initiation of the procedure. The external portion of the existing suprapubic catheter as well as the surrounding skin were prepped and draped in usual sterile fashion A preprocedural spot fluoroscopic image was obtained of the lower pelvis and existing 14 French all-purpose drainage catheter with end coiled and locked overlying the expected location of the urinary bladder. Contrast injection confirmed appropriate positioning and functionality existing suprapubic catheter. The external portion of the existing suprapubic catheter was cut and cannulated with a short Amplatz wire which was coiled within the urinary bladder. Under intermittent fluoroscopic guidance, the track was dilated ultimately allowing placement of a new 16 Fr balloon retention Council catheter. Appropriate positioning was confirmed with the injection of a small amount   of contrast as well as the efflux of urine. The catheter was connected to a gravity bag. A dressing was placed. The patient tolerated the procedure well without immediate postprocedural complication. FINDINGS: Preprocedural imaging demonstrates unchanged positioning of all-purpose drainage catheter with end coiled and locked over the expected location of the urinary bladder. Contrast injection confirms appropriate positioning and functionality of the existing percutaneous drainage catheter. After fluoroscopic guided exchange, a new 16 Fr balloon retention Council catheter is appropriately positioned within the urinary bladder. IMPRESSION: Technically successful fluoroscopic guided exchange, upsize and conversion to a 16 French, balloon retention Council catheter for the purposes of chronic urinary bladder decompression. Electronically Signed   By: John  Watts M.D.   On: 12/04/2019 15:03    ° °DG Abd Portable 1V ° °Result Date: 12/05/2019 °CLINICAL DATA:  Suprapubic catheter change, nausea and vomiting, last bowel movement 2 days ago EXAM: PORTABLE ABDOMEN - 1 VIEW COMPARISON:  CT abdomen pelvis, 12/02/2019 FINDINGS: Diffusely gas-filled small bowel and colon gas present to the rectum. Moderate burden of stool throughout the colon. Suprapubic catheter. Osseous structures are unremarkable. IMPRESSION: 1. Diffusely gas-filled small bowel and colon with gas present to the rectum. Findings may reflect ileus. Moderate burden of stool throughout the colon. 2.  Suprapubic catheter projects in expected location. Electronically Signed   By: Alex  Bibbey M.D.   On: 12/05/2019 18:47  ° °ECHOCARDIOGRAM COMPLETE ° °Result Date: 12/10/2019 °   ECHOCARDIOGRAM REPORT   Patient Name:   Geffrey Landgrebe Date of Exam: 12/10/2019 Medical Rec #:  [id number]      Height:       72.0 in Accession #:    [id number]     Weight:       238.1 lb Date of Birth:  12/31/1960      BSA:          2.294 m² Patient Age:    58 years       BP:           160/75 mmHg Patient Gender: M              HR:           59 bpm. 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                  Risk Factors:Diabetes.  Sonographer:     Jerry Hege RDCS (AE) Referring Phys:  [id number] DILEEP KUMAR Diagnosing Phys: Dwayne D Callwood MD IMPRESSIONS  1. Left ventricular ejection fraction, by estimation, is 70 to 75%. The left ventricle has hyperdynamic function. The left ventricle has no regional wall motion abnormalities. There is mild left ventricular hypertrophy. Left ventricular diastolic parameters are consistent with Grade I diastolic dysfunction (impaired relaxation).  2. Right ventricular systolic function is normal. The right ventricular size is normal.  3. The mitral valve is normal in structure. Trivial mitral  valve regurgitation.  4. The aortic valve is normal in structure. Aortic valve regurgitation is not visualized. FINDINGS  Left Ventricle: Left ventricular ejection fraction, by estimation, is 70 to 75%. The left ventricle has hyperdynamic function. The left ventricle has no regional wall motion abnormalities. The left ventricular internal cavity size was normal in size. There is mild left ventricular hypertrophy. Left ventricular diastolic parameters are consistent with Grade I   diastolic dysfunction (impaired relaxation). Right Ventricle: The right ventricular size is normal. No increase in right ventricular wall thickness. Right ventricular systolic function is normal. Left Atrium: Left atrial size was normal in size. Right Atrium: Right atrial size was normal in size. Pericardium: There is no evidence of pericardial effusion. Mitral Valve: The mitral valve is normal in structure. Trivial mitral valve regurgitation. Tricuspid Valve: The tricuspid valve is grossly normal. Tricuspid valve regurgitation is trivial. Aortic Valve: The aortic valve is normal in structure. Aortic valve regurgitation is not visualized. Aortic valve mean gradient measures 2.0 mmHg. Aortic valve peak gradient measures 3.1 mmHg. Aortic valve area, by VTI measures 5.28 cm². Pulmonic Valve: The pulmonic valve was normal in structure. Pulmonic valve regurgitation is not visualized. Aorta: The aortic root is normal in size and structure. IAS/Shunts: No atrial level shunt detected by color flow Doppler.  LEFT VENTRICLE PLAX 2D LVIDd:         5.15 cm  Diastology LVIDs:         2.89 cm  LV e' lateral:   7.29 cm/s LV PW:         1.38 cm  LV E/e' lateral: 8.2 LV IVS:        1.25 cm  LV e' medial:    4.24 cm/s LVOT diam:     2.40 cm  LV E/e' medial:  14.2 LV SV:         117 LV SV Index:   51 LVOT Area:     4.52 cm²  RIGHT VENTRICLE RV Basal diam:  2.68 cm RV S prime:     11.90 cm/s TAPSE (M-mode): 2.8 cm LEFT ATRIUM           Index       RIGHT ATRIUM            Index LA diam:      3.10 cm 1.35 cm/m²  RA Area:     12.60 cm² LA Vol (A2C): 96.0 ml 41.84 ml/m² RA Volume:   29.90 ml  13.03 ml/m² LA Vol (A4C): 38.3 ml 16.69 ml/m²  AORTIC VALVE                   PULMONIC VALVE AV Area (Vmax):    4.90 cm²    PV Vmax:        0.90 m/s AV Area (Vmean):   4.86 cm²    PV Peak grad:   3.2 mmHg AV Area (VTI):     5.28 cm²    RVOT Peak grad: 3 mmHg AV Vmax:           88.25 cm/s AV Vmean:          63.150 cm/s AV VTI:            0.222 m AV Peak Grad:      3.1 mmHg AV Mean Grad:      2.0 mmHg LVOT Vmax:         95.60 cm/s LVOT Vmean:        67.900 cm/s LVOT VTI:          0.259 m LVOT/AV VTI ratio: 1.17  AORTA Ao Root diam: 3.70 cm MITRAL VALVE MV Area (PHT): 1.97 cm²    SHUNTS MV Decel Time: 385 msec    Systemic VTI:  0.26 m MV E velocity: 60.10 cm/s  Systemic Diam: 2.40 cm MV A velocity: 80.50 cm/s MV E/A ratio:  0.75 Dwayne D Callwood MD Electronically signed by Dwayne   D Callwood MD Signature Date/Time: 12/10/2019/1:00:41 PM    Final   ° ° °Assessment and Plan:  ° °Eugene [name] is a 58 y.o. y/o male who comes in today with a history of diabetic gastroparesis with bilateral below-knee amputations.  The patient has not had a screening colonoscopy and will be set up for screening colonoscopy.  The patient's Linzess is not working for him and he will be put on 290 mcg/day to see if that helps his symptoms.  The patient has been explained the plan agrees with it. ° ° ° ° °Monique Gift, MD. FACG ° ° ° Note: This dictation was prepared with Dragon dictation along with smaller phrase technology. Any transcriptional errors that result from this process are unintentional.  °

## 2019-12-31 NOTE — Progress Notes (Signed)
Primary Care Physician: Abner Greenspan, MD  Primary Gastroenterologist:  Dr. Midge Minium  Chief Complaint  Patient presents with   Follow up GI bleed    HPI: Eugene [name] is a 59 y.o. male here for follow-up after being discharged from the hospital.  The patient had previously been followed by [facility] gastroenterology and has had an upper endoscopy by them.  The patient was then in the hospital in March of this year with intractable nausea and vomiting.  The patient was thought to have diabetic gastroparesis and an EGD showed him to have esophagitis.  The patient was recommended to start Linzess for his chronic constipation and he was treated with carafate a PPI and recommended to try erythromycin. The patient reports that he has not had any further attacks of his gastroparesis with nausea and vomiting.  He is taking the Linzess but it turns out that he is only on the 145 mcg/day and states that he still has to use suppositories to move his bowels.  The patient has gone up to 5 days without a bowel movement.  He denies any black stools or bloody stools.  He also has denies any abdominal pain.  He has not had a screening colonoscopy that he can remember.  Past Medical History:  Diagnosis Date   Anxiety    Depression    Diabetes mellitus without complication (HCC)    Hepatitis C    Hyperlipidemia    IBS (irritable bowel syndrome)    Osteomyelitis (HCC)    Spinal stenosis     Current Outpatient Medications  Medication Sig Dispense Refill   acetaminophen (TYLENOL) 325 MG tablet Take 650 mg by mouth every 4 (four) hours as needed for mild pain or fever.     amLODipine (NORVASC) 10 MG tablet Take 10 mg by mouth daily.     bisacodyl (DULCOLAX) 10 MG suppository Place 10 mg rectally daily as needed for moderate constipation.     famotidine (PEPCID) 20 MG tablet Take 20 mg by mouth daily.      ferrous gluconate (FERGON) 324 MG tablet Take 324 mg by mouth 2 (two) times daily with  a meal.      furosemide (LASIX) 40 MG tablet Take 40 mg by mouth 2 (two) times daily.     hydrALAZINE (APRESOLINE) 50 MG tablet Take 50 mg by mouth 3 (three) times daily.     insulin glargine (LANTUS) 100 UNIT/ML injection Inject 20 Units into the skin at bedtime.      insulin lispro (HUMALOG) 100 UNIT/ML injection Inject 0-15 Units into the skin 4 (four) times daily -  before meals and at bedtime. <150= 0 units 151-200= 3 units 201-250= 6 units 251-300= 9 units 301-350= 12 units 351-400= 15 units >400 call MD     linaclotide (LINZESS) 145 MCG CAPS capsule Take 1 capsule (145 mcg total) by mouth daily before breakfast. 30 capsule 0   loratadine (CLARITIN) 10 MG tablet Take 10 mg by mouth daily.     Melatonin 5 MG TABS Take 5 mg by mouth at bedtime.     methocarbamol (ROBAXIN) 500 MG tablet Take 1,000 mg by mouth 4 (four) times daily.      metoCLOPramide (REGLAN) 10 MG tablet Take 1 tablet (10 mg total) by mouth 4 (four) times daily -  before meals and at bedtime.     metoprolol succinate (TOPROL-XL) 25 MG 24 hr tablet Take 25 mg by mouth daily.     nicotine (NICODERM  CQ - DOSED IN MG/24 HOURS) 21 mg/24hr patch Place 21 mg onto the skin daily.     ondansetron (ZOFRAN) 4 MG tablet Take 4 mg by mouth every 6 (six) hours as needed for nausea or vomiting.     oxyCODONE ER 9 MG C12A Take 9 mg by mouth 2 (two) times daily. 6 capsule 0   Oxycodone HCl 10 MG TABS Take 1 tablet (10 mg total) by mouth every 4 (four) hours as needed. (Patient taking differently: Take 10 mg by mouth every 4 (four) hours as needed (pain). ) 10 tablet 0   pantoprazole (PROTONIX) 40 MG injection Inject 40 mg into the vein every 12 (twelve) hours. 1 each    polyethylene glycol (MIRALAX / GLYCOLAX) 17 g packet Take 17 g by mouth 2 (two) times daily. 14 each 0   pregabalin (LYRICA) 75 MG capsule Take 75 mg by mouth 2 (two) times daily.     senna-docusate (SENOKOT-S) 8.6-50 MG tablet Take 1 tablet by mouth 2  (two) times daily.     sodium phosphate Pediatric (FLEET) 3.5-9.5 GM/59ML enema Place 1 enema rectally every other day as needed for severe constipation.     traZODone (DESYREL) 50 MG tablet Take 50 mg by mouth at bedtime.      No current facility-administered medications for this visit.    Allergies as of 12/31/2019   (No Known Allergies)    ROS:  General: Negative for anorexia, weight loss, fever, chills, fatigue, weakness. ENT: Negative for hoarseness, difficulty swallowing , nasal congestion. CV: Negative for chest pain, angina, palpitations, dyspnea on exertion, peripheral edema.  Respiratory: Negative for dyspnea at rest, dyspnea on exertion, cough, sputum, wheezing.  GI: See history of present illness. GU:  Negative for dysuria, hematuria, urinary incontinence, urinary frequency, nocturnal urination.  Endo: Negative for unusual weight change.    Physical Examination:   BP 114/67    Pulse 76    Temp 98.9 F (37.2 C) (Oral)    Wt 250 lb (113.4 kg) Comment: PT REPORTED. WHEELCHAIR BOUND   BMI 33.91 kg/m   General: Well-nourished, well-developed in no acute distress.  Eyes: No icterus. Conjunctivae pink. Lungs: Clear to auscultation bilaterally. Non-labored. Heart: Regular rate and rhythm, no murmurs rubs or gallops.  Abdomen: Bowel sounds are normal, nontender, nondistended, no hepatosplenomegaly or masses, no abdominal bruits or hernia , no rebound or guarding.   Extremities: Bilateral BKA Neuro: Alert and oriented x 3.  Grossly intact. Skin: Warm and dry, no jaundice.   Psych: Alert and cooperative, normal mood and affect.  Labs:    Imaging Studies: DG Abd 1 View  Result Date: 12/11/2019 CLINICAL DATA:  Constipation EXAM: ABDOMEN - 1 VIEW COMPARISON:  12/10/2019 FINDINGS: Nonobstructive bowel gas pattern. Mild to moderate stool in the rectum. Otherwise normal colonic stool burden. Suprapubic bladder catheter. Thoracic spine fixation hardware with corpectomy and  interbody spacer at T10. IMPRESSION: Mild to moderate rectal stool burden. Electronically Signed   By: Charline BillsSriyesh  Krishnan M.D.   On: 12/11/2019 09:14   DG Abd 1 View  Result Date: 12/10/2019 CLINICAL DATA:  Abdominal pain. EXAM: ABDOMEN - 1 VIEW COMPARISON:  December 05, 2019. FINDINGS: No abnormal bowel dilatation is noted. Large amount of stool seen throughout the colon. Suprapubic bladder catheter is again noted. No radio-opaque calculi or other significant radiographic abnormality are seen. IMPRESSION: Large stool burden is noted suggesting constipation. No abnormal bowel dilatation is noted. Electronically Signed   By: Zenda AlpersJames  Green Jr M.D.  On: 12/10/2019 08:44   CT ABDOMEN PELVIS W CONTRAST  Result Date: 12/03/2019 CLINICAL DATA:  59 year old male with abdominal pain, no bowel movement in 8 days. EXAM: CT ABDOMEN AND PELVIS WITH CONTRAST TECHNIQUE: Multidetector CT imaging of the abdomen and pelvis was performed using the standard protocol following bolus administration of intravenous contrast. CONTRAST:  113mL OMNIPAQUE IOHEXOL 300 MG/ML  SOLN COMPARISON:  CT Abdomen and Pelvis 10/21/2019. FINDINGS: Lower chest: Similar appearance of circumferential distal esophageal wall thickening. No lower posterior mediastinal lymphadenopathy. No pericardial or pleural effusion. Mild right lower lobe lung scarring. Hepatobiliary: Gallbladder and liver within normal limits. No bile duct enlargement. Pancreas: Atrophied but otherwise negative. Spleen: Negative. Adrenals/Urinary Tract: Normal adrenal glands. Symmetric renal enhancement and contrast excretion. No nephrolithiasis identified. No hydronephrosis. There is a chronic suprapubic catheter and the urinary bladder is decompressed today. Stomach/Bowel: Persistent distal fecal impaction, not improved since January and with mildly increased distal sigmoid retained stool. There is distal sigmoid and rectal wall thickening and perirectal inflammatory stranding.  Redundant sigmoid colon which loops in the right lower quadrant. The mid sigmoid is decompressed and without stool. Mild diverticulosis is noted. Retained stool continues however at the junction of the sigmoid and descending colon and throughout redundant left colon. Redundant transverse colon although more containing gas. The cecum is on a lax mesentery in the mid abdomen and distended with gas. No dilated small bowel. Decompressed stomach. Small fat containing umbilical hernia is stable. No free air. No free fluid. Vascular/Lymphatic: Mild aortoiliac calcified atherosclerosis. The major arterial structures appear patent. Portal venous system is patent. Reproductive: Negative. Other: No pelvic free fluid, but persistent and increased perirectal and presacral inflammatory stranding (series 2, image 76). Musculoskeletal: Lower thoracic spinal rods and previous T8-T9 corpectomy and posterior decompression. Chronic L1 compression fracture. No acute osseous abnormality identified. IMPRESSION: 1. Progressed distal Fecal Impaction since January with associated Acute Proctitis / Stercoral Colitis. 2. Redundant large bowel, and substantial retained stool also in the left colon. But no dilated small bowel. 3. Stable suprapubic catheter, decompressed urinary bladder. Electronically Signed   By: Genevie Ann M.D.   On: 12/03/2019 00:07   IR Catheter Tube Change  Result Date: 12/04/2019 INDICATION: History of neurogenic bladder with chronic suprapubic catheter. Patient presents today for fluoroscopic guided exchange conversion of existing 1 French all-purpose drainage catheter to a 16 French balloon retention Council catheter. EXAM: FLUOROSCOPIC GUIDED SUPRAPUBIC CATHETER EXCHANGE COMPARISON:  Image guided suprapubic catheter placement - 10/07/2019 MEDICATIONS: None ANESTHESIA/SEDATION: None CONTRAST:  None FLUOROSCOPY TIME:  30 seconds (5.8 mGy) COMPLICATIONS: None immediate. PROCEDURE: The procedure, risks, benefits, and  alternatives were explained to the patient. Questions regarding the procedure were encouraged and answered. The patient understands and consents to the procedure. A timeout was performed prior to the initiation of the procedure. The external portion of the existing suprapubic catheter as well as the surrounding skin were prepped and draped in usual sterile fashion A preprocedural spot fluoroscopic image was obtained of the lower pelvis and existing 14 French all-purpose drainage catheter with end coiled and locked overlying the expected location of the urinary bladder. Contrast injection confirmed appropriate positioning and functionality existing suprapubic catheter. The external portion of the existing suprapubic catheter was cut and cannulated with a short Amplatz wire which was coiled within the urinary bladder. Under intermittent fluoroscopic guidance, the track was dilated ultimately allowing placement of a new 16 Fr balloon retention Council catheter. Appropriate positioning was confirmed with the injection of a small amount  of contrast as well as the efflux of urine. The catheter was connected to a gravity bag. A dressing was placed. The patient tolerated the procedure well without immediate postprocedural complication. FINDINGS: Preprocedural imaging demonstrates unchanged positioning of all-purpose drainage catheter with end coiled and locked over the expected location of the urinary bladder. Contrast injection confirms appropriate positioning and functionality of the existing percutaneous drainage catheter. After fluoroscopic guided exchange, a new 16 Fr balloon retention Council catheter is appropriately positioned within the urinary bladder. IMPRESSION: Technically successful fluoroscopic guided exchange, upsize and conversion to a 16 [place], balloon retention Council catheter for the purposes of chronic urinary bladder decompression. Electronically Signed   By: Simonne Come M.D.   On: 12/04/2019 15:03     DG Abd Portable 1V  Result Date: 12/05/2019 CLINICAL DATA:  Suprapubic catheter change, nausea and vomiting, last bowel movement 2 days ago EXAM: PORTABLE ABDOMEN - 1 VIEW COMPARISON:  CT abdomen pelvis, 12/02/2019 FINDINGS: Diffusely gas-filled small bowel and colon gas present to the rectum. Moderate burden of stool throughout the colon. Suprapubic catheter. Osseous structures are unremarkable. IMPRESSION: 1. Diffusely gas-filled small bowel and colon with gas present to the rectum. Findings may reflect ileus. Moderate burden of stool throughout the colon. 2.  Suprapubic catheter projects in expected location. Electronically Signed   By: Lauralyn Primes M.D.   On: 12/05/2019 18:47   ECHOCARDIOGRAM COMPLETE  Result Date: 12/10/2019    ECHOCARDIOGRAM REPORT   [name] Date of Exam: 12/10/2019 Medical Rec #:  [id number]      Height:       72.0 in Accession #:    [id number]     Weight:       238.1 lb Date of Birth:  1961-02-22      BSA:          2.294 m Patient Age:    58 years       BP:           160/75 mmHg Patient Gender: M              HR:           59 bpm. 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                  Risk Factors:Diabetes.  Sonographer:     Cristela Blue RDCS (AE) Referring Phys:  [id number] Gillis Santa Diagnosing Phys: Alwyn Pea MD IMPRESSIONS  1. Left ventricular ejection fraction, by estimation, is 70 to 75%. The left ventricle has hyperdynamic function. The left ventricle has no regional wall motion abnormalities. There is mild left ventricular hypertrophy. Left ventricular diastolic parameters are consistent with Grade I diastolic dysfunction (impaired relaxation).  2. Right ventricular systolic function is normal. The right ventricular size is normal.  3. The mitral valve is normal in structure. Trivial mitral  valve regurgitation.  4. The aortic valve is normal in structure. Aortic valve regurgitation is not visualized. FINDINGS  Left Ventricle: Left ventricular ejection fraction, by estimation, is 70 to 75%. The left ventricle has hyperdynamic function. The left ventricle has no regional wall motion abnormalities. The left ventricular internal cavity size was normal in size. There is mild left ventricular hypertrophy. Left ventricular diastolic parameters are consistent with Grade I  diastolic dysfunction (impaired relaxation). Right Ventricle: The right ventricular size is normal. No increase in right ventricular wall thickness. Right ventricular systolic function is normal. Left Atrium: Left atrial size was normal in size. Right Atrium: Right atrial size was normal in size. Pericardium: There is no evidence of pericardial effusion. Mitral Valve: The mitral valve is normal in structure. Trivial mitral valve regurgitation. Tricuspid Valve: The tricuspid valve is grossly normal. Tricuspid valve regurgitation is trivial. Aortic Valve: The aortic valve is normal in structure. Aortic valve regurgitation is not visualized. Aortic valve mean gradient measures 2.0 mmHg. Aortic valve peak gradient measures 3.1 mmHg. Aortic valve area, by VTI measures 5.28 cm. Pulmonic Valve: The pulmonic valve was normal in structure. Pulmonic valve regurgitation is not visualized. Aorta: The aortic root is normal in size and structure. IAS/Shunts: No atrial level shunt detected by color flow Doppler.  LEFT VENTRICLE PLAX 2D LVIDd:         5.15 cm  Diastology LVIDs:         2.89 cm  LV e' lateral:   7.29 cm/s LV PW:         1.38 cm  LV E/e' lateral: 8.2 LV IVS:        1.25 cm  LV e' medial:    4.24 cm/s LVOT diam:     2.40 cm  LV E/e' medial:  14.2 LV SV:         117 LV SV Index:   51 LVOT Area:     4.52 cm  RIGHT VENTRICLE RV Basal diam:  2.68 cm RV S prime:     11.90 cm/s TAPSE (M-mode): 2.8 cm LEFT ATRIUM           Index       RIGHT ATRIUM            Index LA diam:      3.10 cm 1.35 cm/m  RA Area:     12.60 cm LA Vol (A2C): 96.0 ml 41.84 ml/m RA Volume:   29.90 ml  13.03 ml/m LA Vol (A4C): 38.3 ml 16.69 ml/m  AORTIC VALVE                   PULMONIC VALVE AV Area (Vmax):    4.90 cm    PV Vmax:        0.90 m/s AV Area (Vmean):   4.86 cm    PV Peak grad:   3.2 mmHg AV Area (VTI):     5.28 cm    RVOT Peak grad: 3 mmHg AV Vmax:           88.25 cm/s AV Vmean:          63.150 cm/s AV VTI:            0.222 m AV Peak Grad:      3.1 mmHg AV Mean Grad:      2.0 mmHg LVOT Vmax:         95.60 cm/s LVOT Vmean:        67.900 cm/s LVOT VTI:          0.259 m LVOT/AV VTI ratio: 1.17  AORTA Ao Root diam: 3.70 cm MITRAL VALVE MV Area (PHT): 1.97 cm    SHUNTS MV Decel Time: 385 msec    Systemic VTI:  0.26 m MV E velocity: 60.10 cm/s  Systemic Diam: 2.40 cm MV A velocity: 80.50 cm/s MV E/A ratio:  0.75 Alwyn Pea MD Electronically signed by Gerda Diss  Salome Arnt MD Signature Date/Time: 12/10/2019/1:00:41 PM    Final     Assessment and Plan:   Eugene [name] is a 59 y.o. y/o male who comes in today with a history of diabetic gastroparesis with bilateral below-knee amputations.  The patient has not had a screening colonoscopy and will be set up for screening colonoscopy.  The patient's Linzess is not working for him and he will be put on 290 mcg/day to see if that helps his symptoms.  The patient has been explained the plan agrees with it.     Midge Minium, MD. Clementeen Graham    Note: This dictation was prepared with Dragon dictation along with smaller phrase technology. Any transcriptional errors that result from this process are unintentional.

## 2020-01-06 ENCOUNTER — Other Ambulatory Visit: Payer: Self-pay

## 2020-01-06 ENCOUNTER — Encounter: Payer: Self-pay | Admitting: Urology

## 2020-01-06 ENCOUNTER — Ambulatory Visit (INDEPENDENT_AMBULATORY_CARE_PROVIDER_SITE_OTHER): Payer: Medicaid Other | Admitting: Urology

## 2020-01-06 VITALS — BP 118/60 | HR 75

## 2020-01-06 DIAGNOSIS — N319 Neuromuscular dysfunction of bladder, unspecified: Secondary | ICD-10-CM | POA: Diagnosis not present

## 2020-01-06 NOTE — Progress Notes (Signed)
   01/06/2020 5:51 PM   Eugene [name] [birth year] [id number]  Reason for visit: SP tube change  HPI: I saw Eugene [name] back in urology clinic for SP tube exchange.  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  He was originally managed for about 6 months with a urethral Foley that caused severe urethral erosion.  He presented to me in November 2020 and I recommended changing to a suprapubic tube.  This was placed by interventional radiology.  He is had multiple hospitalizations when he is accidentally pulled the tube out, as well as a hospitalization for UTI.  A 16 French SP tube was removed, and the opening was prepped in standard sterile fashion.  A 16 French two-way Foley passed easily into the bladder with return of yellow urine, and 15 cc was placed in the balloon.  The catheter was connected to drainage.  We discussed standard suprapubic tube care and troubleshooting.  I recommended continuing monthly changes in our clinic with the PA.  If he starts to void spontaneously, could consider clamping trial of the SP tube in the future, but I have very little hope that he would be able to void spontaneously with the extent of his neurologic damage from his multiple back surgeries.  RTC 1 month with PA for routine SP tube changes  I spent 20 total minutes on the day of the encounter including pre-visit review of the medical record, face-to-face time with the patient, and post visit ordering of labs/imaging/tests.  Sondra Come, MD  [facility] [address] [place], [place] [address] [phone]

## 2020-01-06 NOTE — Patient Instructions (Signed)
Suprapubic Catheter Replacement  Suprapubic catheter replacement is a procedure to remove an old catheter and insert a new, clean catheter. A suprapubic catheter is a rubber tube that drains urine from the bladder into a collection bag outside the body. The catheter is inserted into the bladder through a small opening in the lower abdomen, near the center of the body, above the pubic bone (suprapubicarea). There is a tiny balloon filled with germ-free (sterile) water on the end of the catheter that is in the bladder. The balloon helps to keep the catheter in place. If you need to wear a catheter for a long period of time, you may be instructed to replace the catheter yourself. Usually, suprapubic catheters need to be replaced every 4-6 weeks, or as often as told by your health care provider. What are the risks? Generally, this is a safe procedure. However, problems may occur, including failure to get the catheter into the bladder. What happens before the procedure?  You may have an exam or testing, including a blood or urine sample.  Ask your health care provider what steps will be taken to help prevent infection. What happens during the procedure?  You will lie on your back.  The water from the balloon will be removed using a syringe.  The catheter will be slowly removed.  Lubricant will be applied to the end of the new catheter that will go into your bladder.  The new catheter will be inserted through the opening in your abdomen. Your health care provider will slide the catheter into your bladder.  Your health care provider will wait for some urine to start flowing through the catheter. When this happens, a syringe will be used to fill the balloon with sterile water.  A collection bag will be attached to the end of the catheter. The procedure may vary among health care providers and hospitals. What can I expect after procedure? After the procedure, it is common to have:  Some  discomfort around the opening in your abdomen. Follow these instructions at home: Caring for the skin around the catheter Use a clean washcloth and soapy water to clean the skin around your catheter every day. Pat the area dry with a clean towel.  Do not pull on the catheter.  Do not use ointment or lotion on this area unless told by your health care provider.  Check the skin around the catheter every day for signs of infection. Check for: ? Redness, swelling, or pain. ? Fluid or blood. ? Warmth. ? Pus or a bad smell. Caring for the catheter  Clean the catheter with soap and water as often as told by your health care provider.  Always make sure there are no twists or curls (kinks) in the catheter.  As soon as you are able to move, you may use a leg bag to collect the urine. ? Make sure that the tubing is straight and without kinks. ? Wrap an ace bandage gently over the tubing and around your leg to minimize the risk of the bag getting pulled out. Emptying the collection bag Empty the large collection bag every 8 hours. Empty the small collection bag when it is about ? full. To empty your large or small collection bag, take the following steps:  Always keep the bag below the level of the catheter. This keeps urine from flowing backward into the catheter.  Hold the bag over the toilet or another container. Turn the valve (spigot) at the bottom of   the bag to empty the urine. ? Do not touch the opening of the spigot. ? Do not let the opening touch the toilet or container.  Close the spigot tightly when the bag is empty. Cleaning the collection bag   Wash your hands with soap and water. If soap and water are not available, use hand sanitizer.  Disconnect the bag from the catheter and immediately attach a new bag to the catheter.  Empty the used bag completely.  Clean the used bag according to the manufacturer's instructions, or as told by your health care provider.  Let the bag  dry completely, and put it in a clean plastic bag before storing it. General instructions   Always wash your hands before and after caring for your catheter and collection bag. Use soap and water. If soap and water are not available, use hand sanitizer.  Always make sure there are no leaks in the catheter or collection bag.  Drink enough fluid to keep your urine pale yellow.  If you were prescribed an antibiotic medicine, take it as told by your health care provider. Do not stop taking the antibiotic even if you start to feel better.  Do not take baths, swim, or use a hot tub.  Keep all follow-up visits as told by your health care provider. This is important. Contact a health care provider if:  You leak urine.  You have redness, swelling, or pain around your catheter opening.  You have fluid or blood coming from your catheter opening.  Your catheter opening feels warm to the touch.  You have pus or a bad smell coming from your catheter opening.  You have a fever or chills.  Your urine flow slows down.  Your urine becomes cloudy or smelly. Get help right away if:  Your catheter comes out.  You feel nauseous.  You have back pain.  You have difficulty changing your catheter.  You have blood in your urine.  You have no urine flow for 1 hour. Summary  Suprapubic catheter replacement is a procedure to remove an old catheter and insert a new, clean catheter.  Make sure that you understand how to care for your catheter, your collection bag, and the opening in your abdomen.  Always wash your hands before and after caring for your catheter and collection bag.  Contact a health care provider if you leak urine or have a fever or any signs of infection around the catheter opening, or if your urine becomes cloudy or smelly.  Get help right away if your catheter comes out or you have nausea, back pain, blood in your urine, no urine flow for 1 hour, or difficulty changing your  catheter. This information is not intended to replace advice given to you by your health care provider. Make sure you discuss any questions you have with your health care provider. Document Revised: 04/30/2018 Document Reviewed: 04/30/2018 Elsevier Patient Education  2020 Elsevier Inc.  

## 2020-01-08 ENCOUNTER — Other Ambulatory Visit: Payer: Self-pay

## 2020-01-08 ENCOUNTER — Other Ambulatory Visit
Admission: RE | Admit: 2020-01-08 | Discharge: 2020-01-08 | Disposition: A | Discharge status: Home / Self Care | Payer: Medicaid Other | Source: Ambulatory Visit | Attending: Gastroenterology | Admitting: Gastroenterology

## 2020-01-08 DIAGNOSIS — Z01812 Encounter for preprocedural laboratory examination: Secondary | ICD-10-CM | POA: Diagnosis not present

## 2020-01-08 DIAGNOSIS — Z20822 Contact with and (suspected) exposure to covid-19: Secondary | ICD-10-CM | POA: Diagnosis not present

## 2020-01-08 LAB — SARS CORONAVIRUS 2 (TAT 6-24 HRS): SARS Coronavirus 2: NEGATIVE

## 2020-01-11 ENCOUNTER — Encounter: Payer: Self-pay | Admitting: Gastroenterology

## 2020-01-12 ENCOUNTER — Encounter
Admission: RE | Disposition: A | Discharge status: Home / Self Care | Payer: Self-pay | Source: Home / Self Care | Attending: Gastroenterology

## 2020-01-12 ENCOUNTER — Encounter: Payer: Self-pay | Admitting: Gastroenterology

## 2020-01-12 ENCOUNTER — Ambulatory Visit: Payer: Medicaid Other | Admitting: Anesthesiology

## 2020-01-12 ENCOUNTER — Ambulatory Visit
Admission: RE | Admit: 2020-01-12 | Discharge: 2020-01-12 | Disposition: A | Discharge status: Home / Self Care | Payer: Medicaid Other | Attending: Gastroenterology | Admitting: Gastroenterology

## 2020-01-12 DIAGNOSIS — E1151 Type 2 diabetes mellitus with diabetic peripheral angiopathy without gangrene: Secondary | ICD-10-CM | POA: Diagnosis not present

## 2020-01-12 DIAGNOSIS — Z89511 Acquired absence of right leg below knee: Secondary | ICD-10-CM | POA: Insufficient documentation

## 2020-01-12 DIAGNOSIS — Z993 Dependence on wheelchair: Secondary | ICD-10-CM | POA: Insufficient documentation

## 2020-01-12 DIAGNOSIS — F17200 Nicotine dependence, unspecified, uncomplicated: Secondary | ICD-10-CM | POA: Diagnosis not present

## 2020-01-12 DIAGNOSIS — Z1211 Encounter for screening for malignant neoplasm of colon: Secondary | ICD-10-CM | POA: Diagnosis not present

## 2020-01-12 DIAGNOSIS — K589 Irritable bowel syndrome without diarrhea: Secondary | ICD-10-CM | POA: Diagnosis not present

## 2020-01-12 DIAGNOSIS — Z79899 Other long term (current) drug therapy: Secondary | ICD-10-CM | POA: Diagnosis not present

## 2020-01-12 DIAGNOSIS — I10 Essential (primary) hypertension: Secondary | ICD-10-CM | POA: Insufficient documentation

## 2020-01-12 DIAGNOSIS — K219 Gastro-esophageal reflux disease without esophagitis: Secondary | ICD-10-CM | POA: Insufficient documentation

## 2020-01-12 DIAGNOSIS — F329 Major depressive disorder, single episode, unspecified: Secondary | ICD-10-CM | POA: Diagnosis not present

## 2020-01-12 DIAGNOSIS — F419 Anxiety disorder, unspecified: Secondary | ICD-10-CM | POA: Insufficient documentation

## 2020-01-12 DIAGNOSIS — E1143 Type 2 diabetes mellitus with diabetic autonomic (poly)neuropathy: Secondary | ICD-10-CM | POA: Insufficient documentation

## 2020-01-12 DIAGNOSIS — K3184 Gastroparesis: Secondary | ICD-10-CM | POA: Diagnosis not present

## 2020-01-12 DIAGNOSIS — Z794 Long term (current) use of insulin: Secondary | ICD-10-CM | POA: Insufficient documentation

## 2020-01-12 DIAGNOSIS — Z89512 Acquired absence of left leg below knee: Secondary | ICD-10-CM | POA: Insufficient documentation

## 2020-01-12 HISTORY — PX: COLONOSCOPY WITH PROPOFOL: SHX5780

## 2020-01-12 LAB — GLUCOSE, CAPILLARY: Glucose-Capillary: 114 mg/dL — ABNORMAL HIGH (ref 70–99)

## 2020-01-12 SURGERY — COLONOSCOPY WITH PROPOFOL
Anesthesia: General

## 2020-01-12 MED ORDER — PHENYLEPHRINE HCL (PRESSORS) 10 MG/ML IV SOLN
INTRAVENOUS | Status: DC | PRN
  Administered 2020-01-12: 100 ug via INTRAVENOUS

## 2020-01-12 MED ORDER — PROPOFOL 10 MG/ML IV BOLUS
INTRAVENOUS | Status: DC | PRN
  Administered 2020-01-12: 60 mg via INTRAVENOUS

## 2020-01-12 MED ORDER — SODIUM CHLORIDE 0.9 % IV SOLN: INTRAVENOUS | Status: DC

## 2020-01-12 MED ORDER — PROPOFOL 500 MG/50ML IV EMUL
INTRAVENOUS | Status: DC | PRN
  Administered 2020-01-12: 125 ug/kg/min via INTRAVENOUS

## 2020-01-12 MED ORDER — PROPOFOL 500 MG/50ML IV EMUL
INTRAVENOUS | Status: AC
  Filled 2020-01-12: qty 50

## 2020-01-12 NOTE — Interval H&P Note (Signed)
History and Physical Interval Note:  01/12/2020 11:19 AM  Eugene [name]  has presented today for surgery, with the diagnosis of Screening Z12.11.  The various methods of treatment have been discussed with the patient and family. After consideration of risks, benefits and other options for treatment, the patient has consented to  Procedure(s): COLONOSCOPY WITH PROPOFOL (N/A) as a surgical intervention.  The patient's history has been reviewed, patient examined, no change in status, stable for surgery.  I have reviewed the patient's chart and labs.  Questions were answered to the patient's satisfaction.     [name] [employer]

## 2020-01-12 NOTE — Op Note (Addendum)
[facility] Patient Name: Eugene [name] Procedure Date: 01/12/2020 12:32 PM MRN: [id number] Account #: [id number] Date of Birth: 1961/08/31 Admit Type: Outpatient Age: 59 Room: [facility] ENDO ROOM 4 Gender: Male Note Status: Finalized Procedure:             Colonoscopy Indications:           Screening for colorectal malignant neoplasm Providers:             Lucilla Lame MD, MD Referring MD:          Herschel Senegal, MD (Referring MD) Medicines:             Propofol per Anesthesia Complications:         No immediate complications. Procedure:             Pre-Anesthesia Assessment:                        - Prior to the procedure, a History and Physical was                         performed, and patient medications and allergies were                         reviewed. The patient's tolerance of previous                         anesthesia was also reviewed. The risks and benefits                         of the procedure and the sedation options and risks                         were discussed with the patient. All questions were                         answered, and informed consent was obtained. Prior                         Anticoagulants: The patient has taken no previous                         anticoagulant or antiplatelet agents. ASA Grade                         Assessment: II - A patient with mild systemic disease.                         After reviewing the risks and benefits, the patient                         was deemed in satisfactory condition to undergo the                         procedure.                        After obtaining informed consent, the colonoscope was  passed under direct vision. Throughout the procedure,                         the patient's blood pressure, pulse, and oxygen                         saturations were monitored continuously. The                         Colonoscope was introduced through the  anus with the                         intention of advancing to the cecum. The scope was                         advanced to the rectum before the procedure was                         aborted. Medications were given. The colonoscopy was                         performed without difficulty. The patient tolerated                         the procedure well. The quality of the bowel                         preparation was unsatisfactory. Findings:      The perianal and digital rectal examinations were normal.      A large amount of stool was found in the rectum. Impression:            - Preparation of the colon was unsatisfactory.                        - Stool in the rectum.                        - No specimens collected. Recommendation:        - Discharge patient to home.                        - Resume previous diet.                        - Continue present medications.                        - Repeat colonoscopy because the bowel preparation was                         poor. Procedure Code(s):     --- Professional ---                        [phone], 53, Colonoscopy, flexible; diagnostic,                         including collection of specimen(s) by brushing or  washing, when performed (separate procedure) Diagnosis Code(s):     --- Professional ---                        Z12.11, Encounter for screening for malignant neoplasm                         of colon CPT copyright 2019 American Medical Association. All rights reserved. The codes documented in this report are preliminary and upon coder review may  be revised to meet current compliance requirements. Midge Minium MD, MD 01/12/2020 12:46:58 PM This report has been signed electronically. Number of Addenda: 0 Note Initiated On: 01/12/2020 12:32 PM Total Procedure Duration: 0 hours 0 minutes 42 seconds  Estimated Blood Loss:  Estimated blood loss: none.      [facility]

## 2020-01-12 NOTE — Transfer of Care (Signed)
Immediate Anesthesia Transfer of Care Note  Patient: Ivann Trimarco  Procedure(s) Performed: COLONOSCOPY WITH PROPOFOL (N/A )  Patient Location: PACU  Anesthesia Type:General  Level of Consciousness: alert  and drowsy  Airway & Oxygen Therapy: Patient Spontanous Breathing  Post-op Assessment: Report given to RN and Post -op Vital signs reviewed and stable  Post vital signs: Reviewed and stable  Last Vitals:  Vitals Value Taken Time  BP 90/60   Temp    Pulse 64 01/12/20 1253  Resp 15 01/12/20 1253  SpO2 91 % 01/12/20 1253  Vitals shown include unvalidated device data.  Last Pain:  Vitals:   01/12/20 1145  TempSrc: Temporal  PainSc: 8          Complications: No apparent anesthesia complications

## 2020-01-12 NOTE — Anesthesia Preprocedure Evaluation (Signed)
Anesthesia Evaluation  Patient identified by MRN, date of birth, ID band Patient awake    Reviewed: Allergy & Precautions, NPO status , Patient's Chart, lab work & pertinent test results  History of Anesthesia Complications Negative for: history of anesthetic complications  Airway Mallampati: III       Dental   Pulmonary neg sleep apnea, neg COPD, Current Smoker,           Cardiovascular hypertension, Pt. on medications + Peripheral Vascular Disease (B LE amputations)  (-) CHF (-) dysrhythmias (-) Valvular Problems/Murmurs     Neuro/Psych neg Seizures Anxiety Depression    GI/Hepatic GERD  Medicated and Controlled,(+) Hepatitis -, C  Endo/Other  diabetes, Type 2, Oral Hypoglycemic Agents  Renal/GU negative Renal ROS     Musculoskeletal   Abdominal   Peds  Hematology   Anesthesia Other Findings   Reproductive/Obstetrics                             Anesthesia Physical Anesthesia Plan  ASA: III  Anesthesia Plan: General   Post-op Pain Management:    Induction: Intravenous  PONV Risk Score and Plan: 1 and Propofol infusion and TIVA  Airway Management Planned: Nasal Cannula  Additional Equipment:   Intra-op Plan:   Post-operative Plan:   Informed Consent: I have reviewed the patients History and Physical, chart, labs and discussed the procedure including the risks, benefits and alternatives for the proposed anesthesia with the patient or authorized representative who has indicated his/her understanding and acceptance.       Plan Discussed with:   Anesthesia Plan Comments:         Anesthesia Quick Evaluation

## 2020-01-13 ENCOUNTER — Encounter: Payer: Self-pay | Admitting: *Deleted

## 2020-01-13 NOTE — Anesthesia Postprocedure Evaluation (Signed)
Anesthesia Post Note  Patient: Eugene [name]  Procedure(s) Performed: COLONOSCOPY WITH PROPOFOL (N/A )  Patient location during evaluation: Endoscopy Anesthesia Type: General Level of consciousness: awake and alert and oriented Pain management: pain level controlled Vital Signs Assessment: post-procedure vital signs reviewed and stable Respiratory status: spontaneous breathing, nonlabored ventilation and respiratory function stable Cardiovascular status: blood pressure returned to baseline and stable Postop Assessment: no signs of nausea or vomiting Anesthetic complications: no     Last Vitals:  Vitals:   01/12/20 1302 01/12/20 1312  BP: 114/69 128/60  Pulse: 74 73  Resp: 20 15  Temp:    SpO2: 94% 94%    Last Pain:  Vitals:   01/13/20 0743  TempSrc:   PainSc: 0-No pain                 Khushbu Pippen

## 2020-01-20 ENCOUNTER — Ambulatory Visit: Payer: Medicaid Other | Admitting: Urology

## 2020-01-21 ENCOUNTER — Ambulatory Visit (INDEPENDENT_AMBULATORY_CARE_PROVIDER_SITE_OTHER): Payer: Medicaid Other | Admitting: Urology

## 2020-01-21 ENCOUNTER — Encounter: Payer: Self-pay | Admitting: Urology

## 2020-01-21 ENCOUNTER — Other Ambulatory Visit: Payer: Self-pay

## 2020-01-21 VITALS — BP 122/68 | HR 72

## 2020-01-21 DIAGNOSIS — Z435 Encounter for attention to cystostomy: Secondary | ICD-10-CM | POA: Diagnosis not present

## 2020-01-21 NOTE — Progress Notes (Signed)
01/21/2020 11:39 AM   Eugene [name] [birth year] [id number]  Referring provider: Marco Collie, MD [address] [place],  [place] [address]  Chief Complaint  Patient presents with  . Neurogenic bladder    HPI: Eugene [name] is a 59 year old male with a neurogenic bladder, urethral erosion and SPT who presents today at the request of his SNIF for SPT issues.  He was not certain as to why he was being seen today.  I spoke with [facility] care and they could not give my more information regarding the reason for his visit.    I asked if he was having any issues with the SPT.  He states that the SPT site is irritated and he has noted blood at the SPT site.  Patient denies any modifying or aggravating factors.  Patient denies any gross hematuria or suprapubic/flank pain.  Patient denies any fevers, chills, nausea or vomiting.   His SPT was last changed on 01/06/2020 by Dr. Diamantina Providence.  No issues noted during that appointment.    PMH: Past Medical History:  Diagnosis Date  . Anxiety   . Depression   . Diabetes mellitus without complication ([place])   . Hepatitis C   . Hyperlipidemia   . IBS (irritable bowel syndrome)   . Osteomyelitis ([place])   . Spinal stenosis     Surgical History: Past Surgical History:  Procedure Laterality Date  . BACK SURGERY    . bilateral amputation Bilateral   . CENTRAL LINE INSERTION-TUNNELED N/A 02/02/2019   Procedure: CENTRAL LINE INSERTION-TUNNELED;  Surgeon: Algernon Huxley, MD;  Location: [place] CV LAB;  Service: Cardiovascular;  Laterality: N/A;  . COLONOSCOPY WITH PROPOFOL N/A 01/12/2020   Procedure: COLONOSCOPY WITH PROPOFOL;  Surgeon: Lucilla Lame, MD;  Location: [facility] ENDOSCOPY;  Service: Endoscopy;  Laterality: N/A;  . ESOPHAGOGASTRODUODENOSCOPY (EGD) WITH PROPOFOL N/A 12/07/2019   Procedure: ESOPHAGOGASTRODUODENOSCOPY (EGD) WITH PROPOFOL;  Surgeon: Lin Landsman, MD;  Location: [facility] ENDOSCOPY;  Service:  Gastroenterology;  Laterality: N/A;  . IR CATHETER TUBE CHANGE  12/04/2019  . SPINAL FUSION    . THORACIC SPINE SURGERY  01/2019   extensive washout    Home Medications:  Allergies as of 01/21/2020   No Known Allergies     Medication List       Accurate as of January 21, 2020 11:59 PM. If you have any questions, ask your nurse or doctor.        acetaminophen 325 MG tablet Commonly known as: TYLENOL Take 650 mg by mouth every 4 (four) hours as needed for mild pain or fever.   amLODipine 10 MG tablet Commonly known as: NORVASC Take 10 mg by mouth daily.   bisacodyl 10 MG suppository Commonly known as: DULCOLAX Place 10 mg rectally daily as needed for moderate constipation.   famotidine 20 MG tablet Commonly known as: PEPCID Take 20 mg by mouth daily.   ferrous gluconate 324 MG tablet Commonly known as: FERGON Take 324 mg by mouth 2 (two) times daily with a meal.   furosemide 40 MG tablet Commonly known as: LASIX Take 40 mg by mouth 2 (two) times daily.   hydrALAZINE 50 MG tablet Commonly known as: APRESOLINE Take 50 mg by mouth 3 (three) times daily.   insulin glargine 100 UNIT/ML injection Commonly known as: LANTUS Inject 20 Units into the skin at bedtime.   insulin lispro 100 UNIT/ML injection Commonly known as: HUMALOG Inject 0-15 Units into the skin 4 (four)  times daily -  before meals and at bedtime. <150= 0 units 151-200= 3 units 201-250= 6 units 251-300= 9 units 301-350= 12 units 351-400= 15 units >400 call MD   linaclotide 145 MCG Caps capsule Commonly known as: LINZESS Take 1 capsule (145 mcg total) by mouth daily before breakfast.   loratadine 10 MG tablet Commonly known as: CLARITIN Take 10 mg by mouth daily.   melatonin 5 MG Tabs Take 5 mg by mouth at bedtime.   methocarbamol 500 MG tablet Commonly known as: ROBAXIN Take 1,000 mg by mouth 4 (four) times daily.   metoCLOPramide 10 MG tablet Commonly known as: REGLAN Take 1 tablet (10  mg total) by mouth 4 (four) times daily -  before meals and at bedtime.   metoprolol succinate 25 MG 24 hr tablet Commonly known as: TOPROL-XL Take 25 mg by mouth daily.   nicotine 21 mg/24hr patch Commonly known as: NICODERM CQ - dosed in mg/24 hours Place 21 mg onto the skin daily.   ondansetron 4 MG tablet Commonly known as: ZOFRAN Take 4 mg by mouth every 6 (six) hours as needed for nausea or vomiting.   oxyCODONE ER 9 MG C12a Take 9 mg by mouth 2 (two) times daily.   Oxycodone HCl 10 MG Tabs Take 1 tablet (10 mg total) by mouth every 4 (four) hours as needed. What changed: reasons to take this   pantoprazole 40 MG injection Commonly known as: PROTONIX Inject 40 mg into the vein every 12 (twelve) hours.   polyethylene glycol 17 g packet Commonly known as: MIRALAX / GLYCOLAX Take 17 g by mouth 2 (two) times daily.   pregabalin 75 MG capsule Commonly known as: LYRICA Take 75 mg by mouth 2 (two) times daily.   senna-docusate 8.6-50 MG tablet Commonly known as: Senokot-S Take 1 tablet by mouth 2 (two) times daily.   sodium phosphate Pediatric 3.5-9.5 GM/59ML enema Place 1 enema rectally every other day as needed for severe constipation.   traZODone 50 MG tablet Commonly known as: DESYREL Take 50 mg by mouth at bedtime.       Allergies: No Known Allergies  Family History: History reviewed. No pertinent family history.  Social History:  reports that he has been smoking cigarettes. He has been smoking about 0.25 packs per day. He has never used smokeless tobacco. He reports previous alcohol use. He reports previous drug use.  ROS: Pertinent ROS in HPI  Physical Exam: BP 122/68   Pulse 72   Constitutional:  Well nourished. Alert and oriented, No acute distress. HEENT: [place] AT, mask in place.  Trachea midline Cardiovascular: No clubbing, cyanosis, or edema. Respiratory: Normal respiratory effort, no increased work of breathing. GI: Abdomen is soft, non tender,  non distended, no abdominal masses.  SPT site is clean and dry, but tubing is found on tension.   Neurologic: Grossly intact, no focal deficits, in a wheel chair.  Psychiatric: Normal mood and affect.  Laboratory Data: Lab Results  Component Value Date   WBC 4.6 12/12/2019   HGB 11.8 (L) 12/12/2019   HCT 34.8 (L) 12/12/2019   MCV 91.6 12/12/2019   PLT 164 12/12/2019    Lab Results  Component Value Date   CREATININE 0.79 12/12/2019    Lab Results  Component Value Date   HGBA1C 8.1 (H) 12/03/2019    Lab Results  Component Value Date   TSH 9.814 (H) 02/26/2019    Lab Results  Component Value Date   AST 37 12/12/2019  Lab Results  Component Value Date   ALT 76 (H) 12/12/2019    Urinalysis    Component Value Date/Time   COLORURINE YELLOW (A) 12/02/2019 2131   APPEARANCEUR CLOUDY (A) 12/02/2019 2131   LABSPEC 1.032 (H) 12/02/2019 2131   PHURINE 8.0 12/02/2019 2131   GLUCOSEU >=500 (A) 12/02/2019 2131   HGBUR SMALL (A) 12/02/2019 2131   BILIRUBINUR NEGATIVE 12/02/2019 2131   KETONESUR 20 (A) 12/02/2019 2131   PROTEINUR >=300 (A) 12/02/2019 2131   NITRITE NEGATIVE 12/02/2019 2131   LEUKOCYTESUR SMALL (A) 12/02/2019 2131   I have reviewed the labs.     Assessment & Plan:    1. Suprapubic catheter issues Explained to the patient that the bleeding and the redness in the SPT site is likely due to his the catheter tubing being tension.  I demonstrated to him the amount of slack to be in the line and not to pull it tight through his depends and pants. He will return as schedule for his monthly SPT change on Feb 05, 2020  Return for keep follow up with [facility] on 02/05/2020 .  These notes generated with voice recognition software. I apologize for typographical errors.  Michiel Cowboy, PA-C  [facility] [address]  [address] [place], [place] [address] [phone]

## 2020-02-05 ENCOUNTER — Other Ambulatory Visit: Payer: Self-pay

## 2020-02-05 ENCOUNTER — Ambulatory Visit (INDEPENDENT_AMBULATORY_CARE_PROVIDER_SITE_OTHER): Payer: Medicaid Other | Admitting: Physician Assistant

## 2020-02-05 ENCOUNTER — Encounter: Payer: Self-pay | Admitting: Physician Assistant

## 2020-02-05 VITALS — BP 108/62 | HR 70

## 2020-02-05 DIAGNOSIS — N319 Neuromuscular dysfunction of bladder, unspecified: Secondary | ICD-10-CM | POA: Diagnosis not present

## 2020-02-05 DIAGNOSIS — N3000 Acute cystitis without hematuria: Secondary | ICD-10-CM | POA: Diagnosis not present

## 2020-02-05 MED ORDER — SULFAMETHOXAZOLE-TRIMETHOPRIM 800-160 MG PO TABS: 1.0000 | ORAL_TABLET | Freq: Two times a day (BID) | ORAL | 0 refills | Status: DC

## 2020-02-05 MED ORDER — SULFAMETHOXAZOLE-TRIMETHOPRIM 800-160 MG PO TABS: 1.0000 | ORAL_TABLET | Freq: Two times a day (BID) | ORAL | 0 refills | Status: AC

## 2020-02-05 NOTE — Progress Notes (Signed)
Suprapubic Cath Change  Patient is present today for a suprapubic catheter change due to urinary retention.  13ml of water was drained from the balloon, a 16FR foley cath was removed from the tract without difficulty.  Site was cleaned and prepped in a sterile fashion with betadine.  A 16FR foley cath was replaced into the tract no complications were noted. Urine return was noted, 15 ml of sterile water was inflated into the balloon and a night bag was attached for drainage.  Patient tolerated well. Patient declined additional drainage bags.  Performed by: Carman Ching, PA-C   Additional notes: Malodorous urine noted today with erythema surrounding the SPT tract as well as purulent discharge. Starting patient on Bactrim DS BID x7 days for UTI/cellulitis.  Follow up: Return in about 4 weeks (around 03/04/2020) for SPT exchange.    I spent 10 minutes on the day of the encounter to include pre-visit record review, face-to-face time with the patient, and post-visit ordering of tests.  Carman Ching, PA-C 02/05/20 9:25 AM

## 2020-02-21 IMAGING — CR CHEST  1 VIEW
1 series · 1 of 1 positions shown · non-contrast
Comparison: None.

CLINICAL DATA: Unknown tetanus immunization status.  For  MRI.

EXAM:
CHEST  1 VIEW

[chest ap]
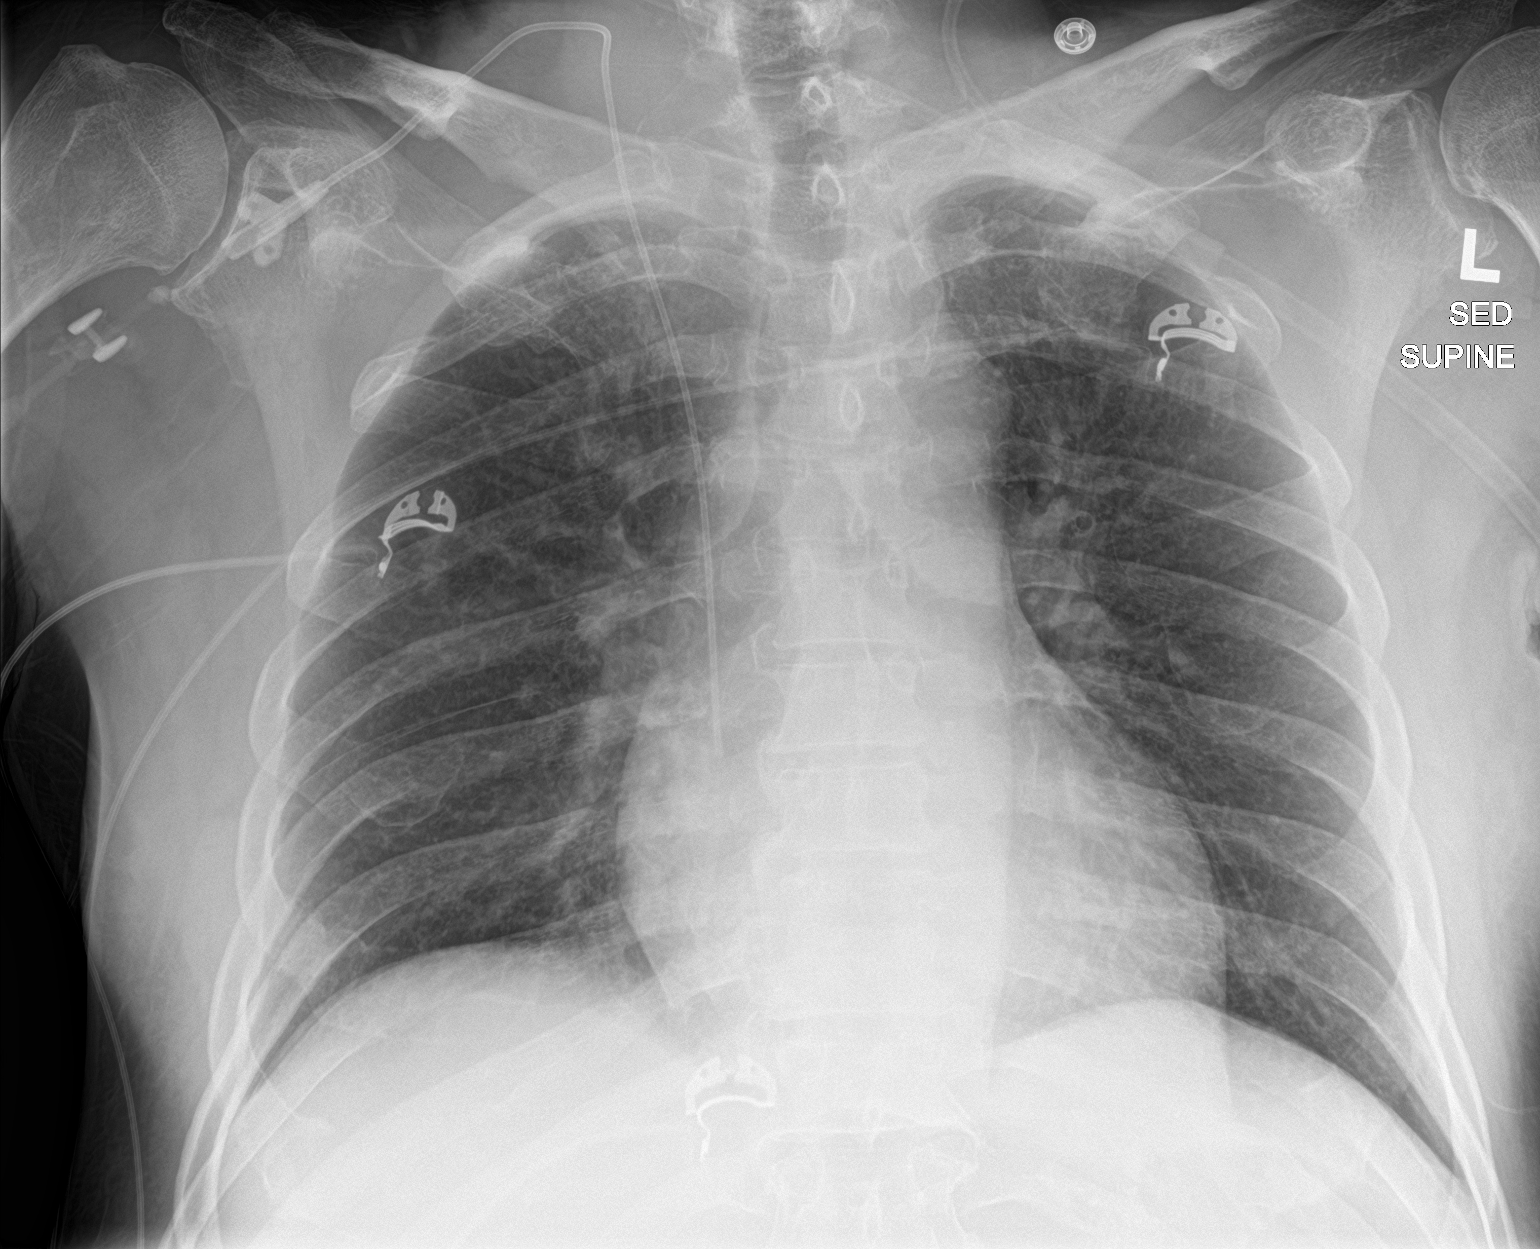

[1 of 1 positions shown; findings below may reference images not displayed]

FINDINGS: Right jugular central venous catheter tip at the cavoatrial
junction. No pneumothorax. Lungs are clear without infiltrate or
effusion. No implanted device
IMPRESSION: Central line at the cavoatrial junction.  No acute abnormality.

## 2020-02-21 IMAGING — MR MRI THORACIC SPINE WITHOUT CONTRAST
8 series · 39 of 48 positions shown · non-contrast
Comparison: None.

CLINICAL DATA: 57 y/o M; T8-T9 vertebral osteomyelitis post
laminectomy 12/22/2018 for follow-up.

EXAM:
MRI THORACIC SPINE WITHOUT CONTRAST
TECHNIQUE: Multiplanar, multisequence MR imaging of the thoracic spine was
performed. No intravenous contrast was administered.

[Series 18: T1 · sagittal · 6.0mm · 1.88mm/px · 1 of 9 slices shown (1 of 3)]
[im 1/9]
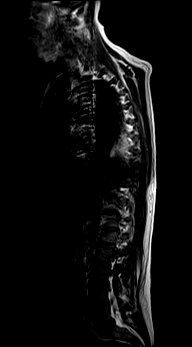

[Series 19: T2 · sagittal · 3.0mm · 1.33mm/px · 5 of 19 slices shown (1 of 3)]
[im 1/19]
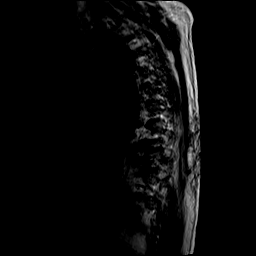
[im 5/19]
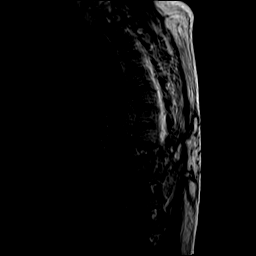
[im 10/19]
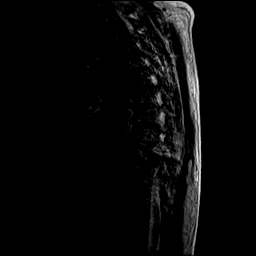
[im 14/19]
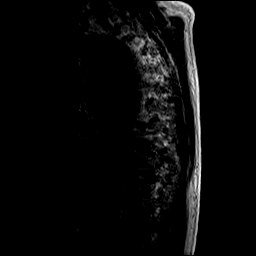
[im 19/19]
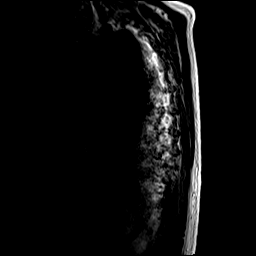

[Series 20: T1 · sagittal · 3.0mm · 1.33mm/px · 5 of 19 slices shown (2 of 3)]
[im 1/19]
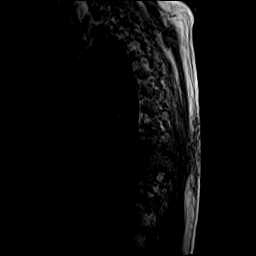
[im 5/19]
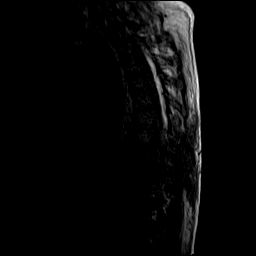
[im 10/19]
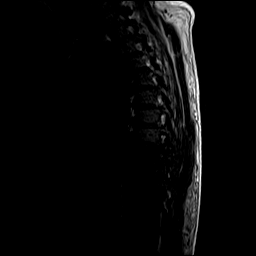
[im 14/19]
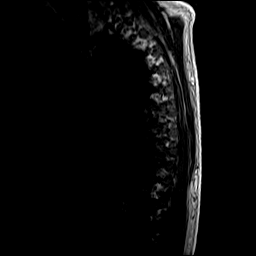
[im 19/19]
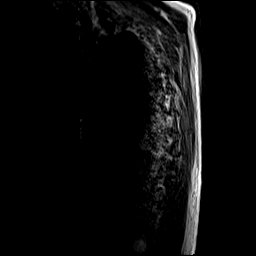

[Series 21: T2 · sagittal · 3.0mm · 1.33mm/px · 5 of 19 slices shown (2 of 3)]
[im 1/19]
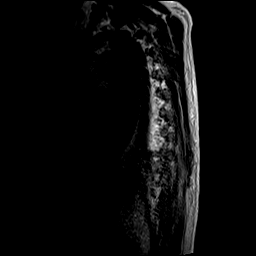
[im 5/19]
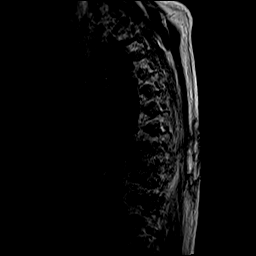
[im 10/19]
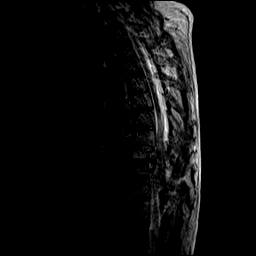
[im 14/19]
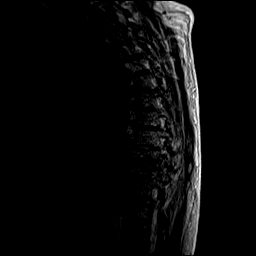
[im 19/19]
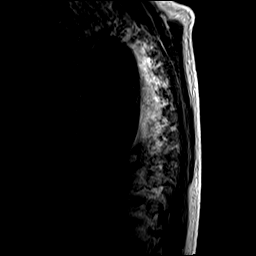

[Series 22: T1 · sagittal · 3.0mm · 1.33mm/px · 5 of 19 slices shown (3 of 3)]
[im 1/19]
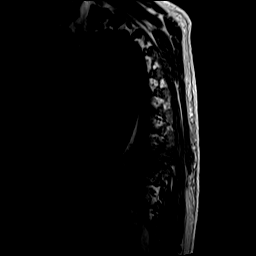
[im 5/19]
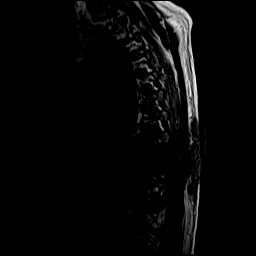
[im 10/19]
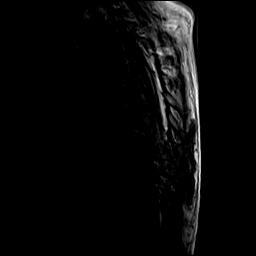
[im 14/19]
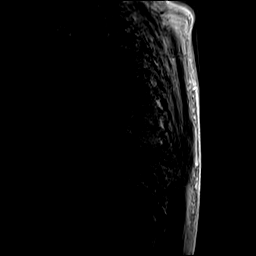
[im 19/19]
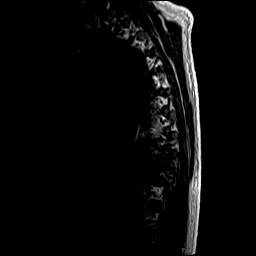

[Series 23: STIR · sagittal · 3.0mm · 0.66mm/px · 5 of 19 slices shown]
[im 1/19]
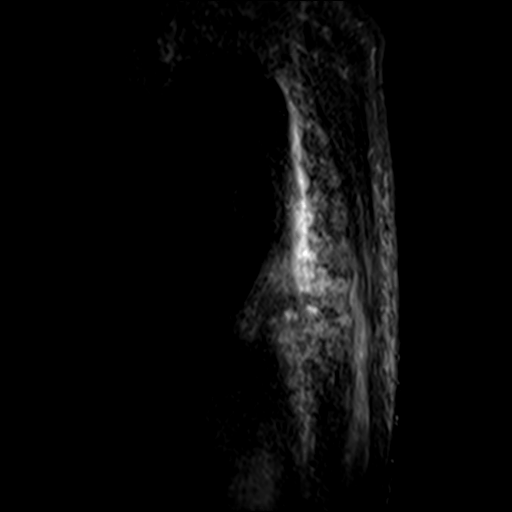
[im 5/19]
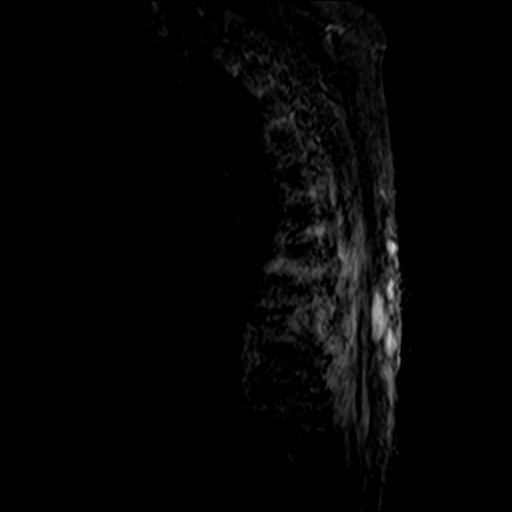
[im 10/19]
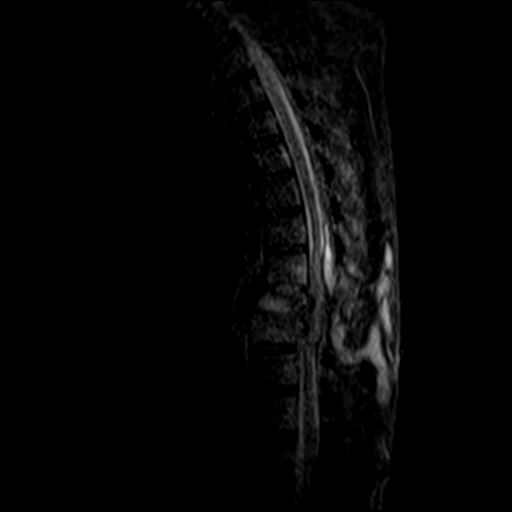
[im 14/19]
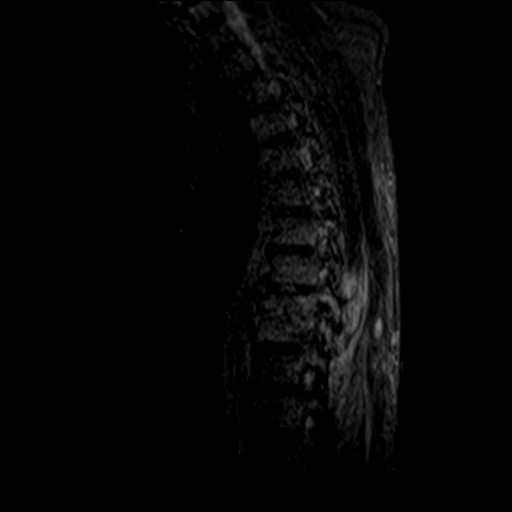
[im 19/19]
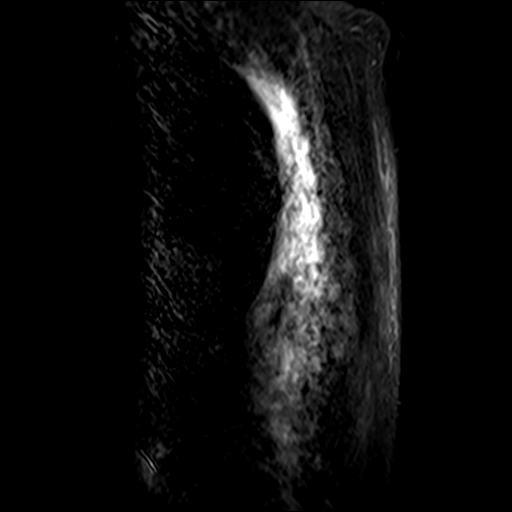

[Series 24: T2 · axial · 4.0mm · 0.59mm/px · z∈[-236,+13]mm · 11 of 42 slices shown (3 of 3)]
[im 1/42]
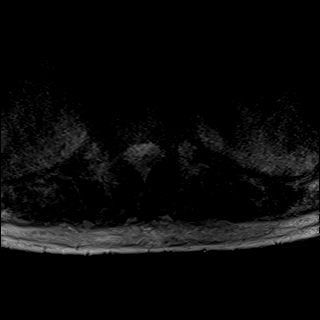
[im 5/42]
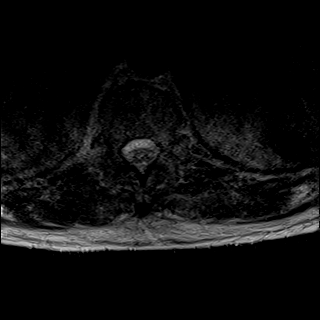
[im 9/42]
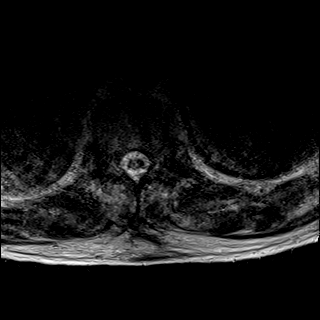
[im 13/42]
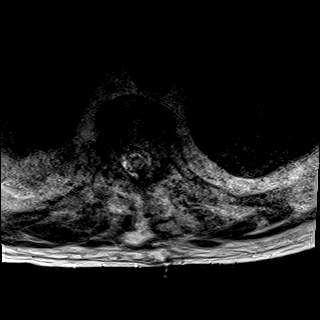
[im 17/42]
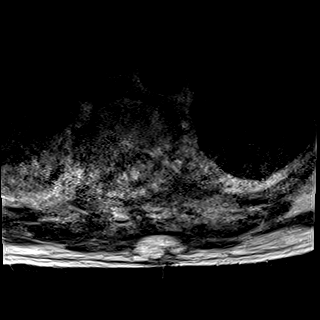
[im 21/42]
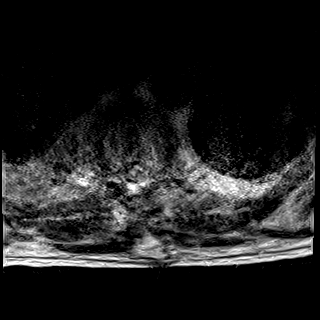
[im 25/42]
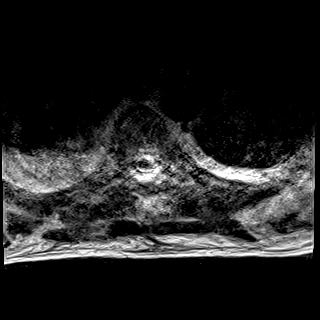
[im 29/42]
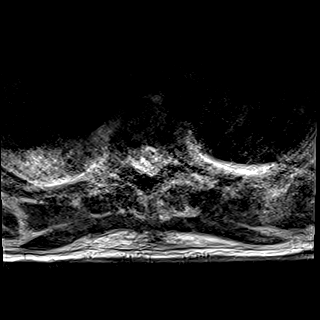
[im 33/42]
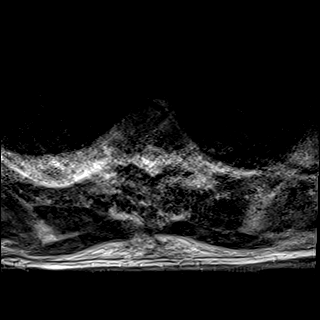
[im 37/42]
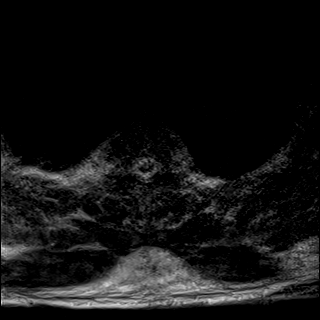
[im 42/42]
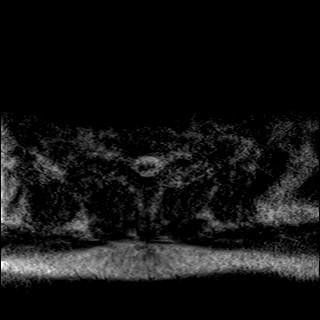

[Series 25: GRE · axial · 4.0mm · 0.37mm/px · z∈[-236,-188]mm · 2 of 42 slices shown]
[im 1/42]
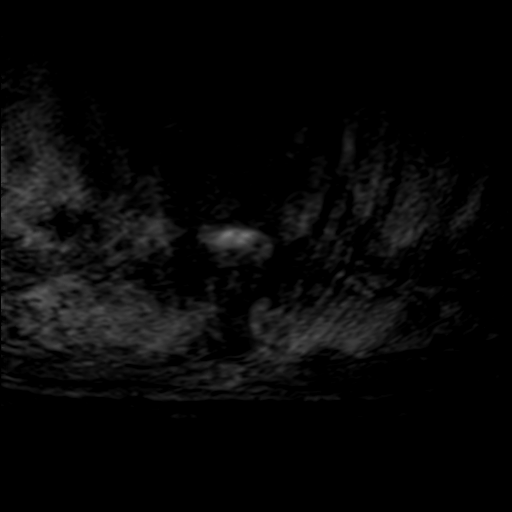
[im 9/42]
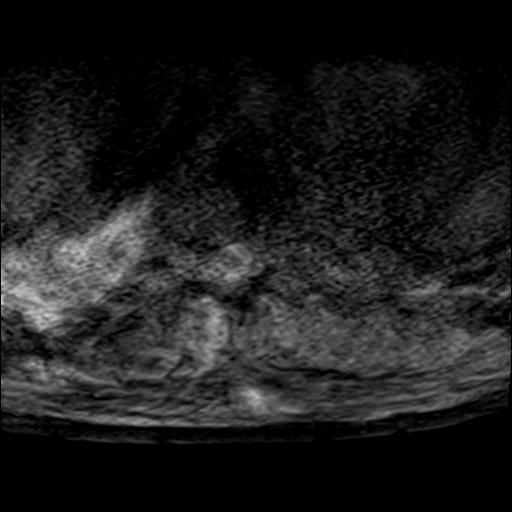

[39 of 48 positions shown; findings below may reference images not displayed]

FINDINGS: Severely motion degraded study.

Alignment: T8-9 grade 1 retrolisthesis and focal kyphosis due to
partial collapse of the vertebral bodies and loss of the
intervertebral disc space.

Vertebrae: T8-9 discitis osteomyelitis. Diffuse low T1 signal and
increased T2 signal throughout the T8 and T9 vertebral bodies
extending into the transverse and posterior elements. There is
approximately 50% loss of height of the T8 vertebral body and
deformity of the superior endplate of the T9 vertebral body with
10-20% loss of height. Associated focal kyphosis and up to 7 mm of
bony retropulsion of T8 impinge the spinal cord which demonstrates
cord edema (series 23, image 10).

There is a dorsal epidural abscess that extends superiorly to the T5
level. The epidural collection is in communication with a
multiloculated collection within the dorsal subcutaneous fat and
paraspinal muscles extending from T6-T11. The 2 collections appear
to communicate through a fluid collection within the left T8-9 facet
joint (series 23, image 10).

There may be a small ventral epidural abscess posterior to the T9
vertebral body as well.

Cord: Increased cord signal from the T6-7 level to the T9-10 level
likely related to compressive myelopathy as described above.

Paraspinal and other soft tissues: Extensive edema within the
paravertebral soft tissues as well as paraspinal muscles and dorsal
subcutaneous fat. Small anterior paraspinal collection are present
at the T8-9 intervertebral disc level bilaterally measuring up to 18
mm in the right and 10 mm on the left (series 24, image 26).

Disc levels: No significant spinal canal stenosis or foraminal
stenosis at additional thoracic spinal levels.
IMPRESSION: Severe motion degraded study.

1. T8-9 discitis osteomyelitis. 50% loss of height of T8 vertebral
body and deformity of T9 superior endplate with 10-20% loss of
vertebral body height.
2. Focal kyphosis and 7 mm bony retropulsion of T8 vertebral body
impinge the spinal cord. Cord edema from T6-T10 likely represents
compressive myelopathy.
3. Dorsal epidural abscess extends superiorly to T5 level. Small
ventral epidural abscess may be present posterior to T9 vertebral
body.
4. Dorsal epidural abscess appears to communicate with a collection
in the left T8-9 facet and multiloculated collection within dorsal
soft tissues.
5. Small anterior paraspinal collections at the T8-9 disc level
measuring 18 mm on right and 10 mm on the left.

These results will be called to the ordering clinician or
representative by the Radiologist Assistant, and communication
documented in the PACS or zVision Dashboard.

## 2020-03-07 ENCOUNTER — Ambulatory Visit (INDEPENDENT_AMBULATORY_CARE_PROVIDER_SITE_OTHER): Payer: Medicaid Other

## 2020-03-07 ENCOUNTER — Other Ambulatory Visit: Payer: Self-pay

## 2020-03-07 DIAGNOSIS — N319 Neuromuscular dysfunction of bladder, unspecified: Secondary | ICD-10-CM

## 2020-03-07 DIAGNOSIS — R338 Other retention of urine: Secondary | ICD-10-CM | POA: Diagnosis not present

## 2020-03-07 NOTE — Progress Notes (Signed)
Suprapubic Cath Change  Patient is present today for a suprapubic catheter change due to urinary retention.  46ml of water was drained from the balloon, a 16FR foley cath was removed from the tract with out difficulty.  Site was cleaned and prepped in a sterile fashion with betadine.  A 16FR foley cath was replaced into the tract no complications were noted. Urine return was noted, 10 ml of sterile water was inflated into the balloon and a night bag was attached for drainage.  Patient tolerated well.   Preformed by: Eligha Bridegroom, CMA and Gerarda Gunther, CMA  Follow up: 1 month

## 2020-03-31 ENCOUNTER — Telehealth: Payer: Self-pay | Admitting: *Deleted

## 2020-03-31 NOTE — Telephone Encounter (Signed)
The nurse for the home call in today and states the patient is leaking from his penis. He has a spt tub . He is not having any pain and the urine is going into his bag. Talked with Sam and she states that has long as he is not having pain its okay. He has an appt on 04/06/2020 for exchange . The home will call back if he starts having pain .

## 2020-04-06 ENCOUNTER — Ambulatory Visit (INDEPENDENT_AMBULATORY_CARE_PROVIDER_SITE_OTHER): Payer: Medicaid Other | Admitting: Physician Assistant

## 2020-04-06 ENCOUNTER — Other Ambulatory Visit: Payer: Self-pay

## 2020-04-06 ENCOUNTER — Encounter: Payer: Self-pay | Admitting: Physician Assistant

## 2020-04-06 ENCOUNTER — Telehealth: Payer: Self-pay | Admitting: *Deleted

## 2020-04-06 VITALS — BP 104/65 | HR 65

## 2020-04-06 DIAGNOSIS — N319 Neuromuscular dysfunction of bladder, unspecified: Secondary | ICD-10-CM | POA: Diagnosis not present

## 2020-04-06 NOTE — Telephone Encounter (Signed)
Jasmine from [employer] called stating he has not produced urine since this morning during the SPT change. Per Carman Ching, PA return to office. Will arrange patient to bring to office as soon as possible.

## 2020-04-06 NOTE — Progress Notes (Signed)
Suprapubic Cath Change  Patient is present today for a suprapubic catheter change due to urinary retention.  75ml of water was drained from the balloon, a 16FR foley cath was removed from the tract without difficulty.  Site was cleaned and prepped in a sterile fashion with betadine.  A 16FR foley cath was replaced into the tract no complications were noted. Urine return was not noted, 15 ml of sterile water was inflated into the balloon and a night bag was attached for drainage and secured to his right leg with a leg strap.  Patient tolerated well. Patient declined additional supplies.  Performed by: Carman Ching, PA-C   Follow up: Return to clinic if no urinary output within 2 hours. Return in about 4 weeks (around 05/04/2020) for SPT exchange.

## 2020-05-05 ENCOUNTER — Encounter: Payer: Self-pay | Admitting: Physician Assistant

## 2020-05-05 ENCOUNTER — Other Ambulatory Visit: Payer: Self-pay

## 2020-05-05 ENCOUNTER — Ambulatory Visit (INDEPENDENT_AMBULATORY_CARE_PROVIDER_SITE_OTHER): Payer: Medicaid Other | Admitting: Physician Assistant

## 2020-05-05 VITALS — BP 113/75 | HR 73

## 2020-05-05 DIAGNOSIS — N319 Neuromuscular dysfunction of bladder, unspecified: Secondary | ICD-10-CM

## 2020-05-05 NOTE — Progress Notes (Signed)
Suprapubic Cath Change  Patient is present today for a suprapubic catheter change due to urinary retention.  75ml of water was drained from the balloon, a 16FR foley cath was removed from the tract without difficulty.  Site was cleaned and prepped in a sterile fashion with betadine.  A 16FR foley cath was replaced into the tract no complications were noted. Urine return was noted, 15 ml of sterile water was inflated into the balloon and a night bag was attached for drainage and secured to his right thigh with a leg strap.  Patient tolerated well. He declined additional drainage bags.    Performed by: Carman Ching, PA-C   Follow up: Return in about 4 weeks (around 06/02/2020) for SPT exchange.

## 2020-06-06 ENCOUNTER — Other Ambulatory Visit: Payer: Self-pay

## 2020-06-06 ENCOUNTER — Ambulatory Visit (INDEPENDENT_AMBULATORY_CARE_PROVIDER_SITE_OTHER): Payer: Medicaid Other | Admitting: Physician Assistant

## 2020-06-06 ENCOUNTER — Encounter: Payer: Self-pay | Admitting: Physician Assistant

## 2020-06-06 VITALS — BP 138/74 | HR 69 | Ht 60.0 in | Wt 240.0 lb

## 2020-06-06 DIAGNOSIS — N319 Neuromuscular dysfunction of bladder, unspecified: Secondary | ICD-10-CM | POA: Diagnosis not present

## 2020-06-06 MED ORDER — CEPHALEXIN 500 MG PO CAPS: 500.0000 mg | ORAL_CAPSULE | Freq: Four times a day (QID) | ORAL | 0 refills | Status: AC

## 2020-06-06 NOTE — Progress Notes (Signed)
Suprapubic Cath Change  Patient is present today for a suprapubic catheter change due to urinary retention.  39ml of water was drained from the balloon, a 16FR foley cath was removed from the tract without difficulty.  Site was cleaned and prepped in a sterile fashion with betadine.  A 16FR foley cath was replaced into the tract no complications were noted. Urine return was noted, 15 ml of sterile water was inflated into the balloon and a night bag was attached for drainage.  Patient tolerated well.    Performed by: Carman Ching, PA-C and Ples Specter, CMA  Follow up: Return in about 4 weeks (around 07/04/2020) for SPT exchange.    Additional notes: Catheter noted to be on significant tension upon presentation today. Patient reported a "cut" in his skin from his catheter. On physical exam, the suprapubic catheter had eroded through the skin along the right groin due to chronic tension. Dr. Richardo Hanks also examined the patient. We counseled the patient extensively to maintain slack on the catheter tubing and prevent tension on the catheter to prevent further catheter erosion. Patient to alternate legs for catheter strap placement moving forward; catheter strapped to his left leg today. Patient expressed understanding. Keflex 500mg  QID x5 days sent to his pharmacy for infection prevention with open groin wound.

## 2020-06-06 NOTE — Patient Instructions (Signed)
Make sure your catheter is never pulled tight on tension. Your catheter tubing should always be loose so that it doesn't cut into your skin. If your catheter is tight against your skin, move your leg strap up your leg so that the catheter can have more slack.

## 2020-07-07 ENCOUNTER — Encounter: Payer: Self-pay | Admitting: Physician Assistant

## 2020-07-07 ENCOUNTER — Ambulatory Visit (INDEPENDENT_AMBULATORY_CARE_PROVIDER_SITE_OTHER): Payer: Medicaid Other | Admitting: Physician Assistant

## 2020-07-07 ENCOUNTER — Other Ambulatory Visit: Payer: Self-pay

## 2020-07-07 VITALS — BP 124/75 | HR 84

## 2020-07-07 DIAGNOSIS — N319 Neuromuscular dysfunction of bladder, unspecified: Secondary | ICD-10-CM

## 2020-07-07 NOTE — Progress Notes (Signed)
Suprapubic Cath Change  Patient is present today for a suprapubic catheter change due to urinary retention.  27ml of water was drained from the balloon, a 16FR foley cath was removed from the tract without difficulty.  Site was cleaned and prepped in a sterile fashion with betadine.  A 16FR foley cath was replaced into the tract no complications were noted. Urine return was noted, 20 ml of sterile water was inflated into the balloon and a night bag was attached for drainage.  Patient tolerated well. A night bag was given to patient and proper instruction was given on how to switch bags.    Performed by: Carman Ching, PA-C and Franchot Erichsen, CMA  Additional notes: Prior site of right groin drainage noted to be healing well today. He continues to report odor from the site, however no purulence or erythema noted today. He denies fever, chills, nausea, or vomiting. Will defer antibiotics given likely colonized status. Catheter again found to be on his right leg today. Attached new catheter to his left leg with a StatLock with significant slack on the tubing.  Follow up: Return in about 4 weeks (around 08/04/2020) for SPT exchange.

## 2020-08-03 NOTE — Progress Notes (Signed)
Suprapubic Cath Change Patient is present today for a suprapubic catheter change due to urinary retention.  8 ml of water was drained from the balloon, a 16 FR foley cath was removed from the tract with out difficulty.  Site was cleaned and prepped in a sterile fashion with betadine.  A 16 FR foley cath was replaced into the tract no complications were noted. Urine return was noted, 10 ml of sterile water was inflated into the balloon and an overninght bag was attached for drainage.  Patient tolerated well.   Performed by: Michiel Cowboy, PA-C and Gerarda Gunther, CMA  Follow up: One month SPT exchange

## 2020-08-04 ENCOUNTER — Other Ambulatory Visit: Payer: Self-pay

## 2020-08-04 ENCOUNTER — Ambulatory Visit (INDEPENDENT_AMBULATORY_CARE_PROVIDER_SITE_OTHER): Payer: Medicaid Other | Admitting: Urology

## 2020-08-04 DIAGNOSIS — Z435 Encounter for attention to cystostomy: Secondary | ICD-10-CM | POA: Diagnosis not present

## 2020-09-07 NOTE — Progress Notes (Signed)
Suprapubic Cath Change  Patient is present today for a suprapubic catheter change due to urinary retention.  8 ml of water was drained from the balloon, a 16 FR foley cath was removed from the tract with out difficulty.  Site was cleaned and prepped in a sterile fashion with betadine.  A 16 FR foley cath was replaced into the tract no complications were noted. Urine return was noted, 10 ml of sterile water was inflated into the balloon and a night bag was given to patient and proper instruction was given on how to switch bags.  I also secured the SPT with a STAT lock.  Patient tolerated the procedure   Performed by: Michiel Cowboy, PA-C  Follow up: One month for SPT change  At today's visit, Mr. Doughtie smelled strongly of urine. On examination his depends were soiled with urine and the depends appeared worn. When I asked Mr. Kubitz about his depends, he states he can go several days without the facility changing his depends for him. He also has some mild penile edema, tinea cruris and a suprapubic tube erosion track that runs from the suprapubic site down to his penoscrotal junction. The catheter tubing is actually in this "tunnel' and it initially appears that the suprapubic tube is not in place. I advised Mr. Alipio that he cannot have tension on the suprapubic tube and he replied that he continually informs the staff that it needs to be on slack. He states they do not listen to him.  I reached out to [employer] and spoke with Dorann Lodge, his care nurse. I informed her of my findings and voiced my concerns. She stated that she felt that his CNA and him had a good working relationship and that he may be refusing the changing of his depends.  I also made her aware of the SPT erosion and that we are referring him to wound care for management.  She stated she would also speak to his CNA and make sure the SPT is being cared for properly.    I will be following up with Juanita to ensure this takes  place and that Mr. Aloia is receiving the best care possible.    I spent 20 minutes on the day of the encounter to include pre-visit record review, face-to-face time with the patient, and post-visit ordering of tests.

## 2020-09-08 ENCOUNTER — Ambulatory Visit (INDEPENDENT_AMBULATORY_CARE_PROVIDER_SITE_OTHER): Payer: Medicaid Other | Admitting: Urology

## 2020-09-08 ENCOUNTER — Other Ambulatory Visit: Payer: Self-pay

## 2020-09-08 DIAGNOSIS — Z435 Encounter for attention to cystostomy: Secondary | ICD-10-CM

## 2020-10-11 NOTE — Progress Notes (Signed)
Suprapubic Cath Change  Patient is present today for a suprapubic catheter change due to urinary retention.  8 ml of water was drained from the balloon, a 16 FR foley cath was removed from the tract with out difficulty.  Site was cleaned and prepped in a sterile fashion with betadine.  A 16 FR foley cath was replaced into the tract no complications were noted. Urine return was noted, 10 ml of sterile water was inflated into the balloon and an overnight  bag was attached for drainage.  Patient tolerated well.   Performed by: Michiel Cowboy, PA-C  Follow up: One month for SPT exchange  The erosion that had been developing from the suprapubic site down into the his penoscrotal junction has improved greatly.  His depends are soiled with urine as well as his shorts.  His sweatshirt, which he wears to each appointment, has not been washed for several weeks.  When I asked Mr. Morocho about washing his clothing, he states he does not send his clothes to be washed as he is unable to label them.  We will asked the facility to label his clothing, so that he may be able to send them to be laundered.  They will continue to keep slack in the suprapubic tubing.   I spent 15 minutes on the day of the encounter to include pre-visit record review, face-to-face time with the patient, and post-visit ordering of tests.

## 2020-10-12 ENCOUNTER — Ambulatory Visit (INDEPENDENT_AMBULATORY_CARE_PROVIDER_SITE_OTHER): Payer: Medicaid Other | Admitting: Urology

## 2020-10-12 ENCOUNTER — Other Ambulatory Visit: Payer: Self-pay

## 2020-10-12 DIAGNOSIS — Z435 Encounter for attention to cystostomy: Secondary | ICD-10-CM | POA: Diagnosis not present

## 2020-11-04 ENCOUNTER — Ambulatory Visit: Payer: Medicaid Other | Admitting: Physician Assistant

## 2020-11-09 NOTE — Progress Notes (Deleted)
Suprapubic Cath Change  Patient is present today for a suprapubic catheter change due to urinary retention.  ***ml of water was drained from the balloon, a ***FR foley cath was removed from the tract with out difficulty.  Site was cleaned and prepped in a sterile fashion with betadine.  A ***FR foley cath was replaced into the tract {dnt complications:[id number]}. Urine return was noted, 10 ml of sterile water was inflated into the balloon and a *** bag was attached for drainage.  Patient tolerated well. A night bag was given to patient and proper instruction was given on how to switch bags.    Preformed by: ***  Follow up: ***  

## 2020-11-10 ENCOUNTER — Ambulatory Visit: Payer: Self-pay | Admitting: Urology

## 2020-11-10 DIAGNOSIS — Z435 Encounter for attention to cystostomy: Secondary | ICD-10-CM

## 2020-11-11 ENCOUNTER — Encounter: Payer: Self-pay | Admitting: Urology

## 2020-12-06 NOTE — Progress Notes (Signed)
Suprapubic Cath Change  Patient is present today for a suprapubic catheter change due to urinary retention.  25 ml of water was drained from the balloon, a 18 FR foley cath was removed from the tract with out difficulty.  Site was cleaned and prepped in a sterile fashion with betadine.  A 18 FR foley cath was replaced into the tract no complications were noted. Urine return was noted, 10 ml of sterile water was inflated into the balloon a night bag was attached for the patient.   Performed by: Myself and Gerarda Gunther, CMA   Follow up: One month for SPT exchange

## 2020-12-07 ENCOUNTER — Ambulatory Visit (INDEPENDENT_AMBULATORY_CARE_PROVIDER_SITE_OTHER): Payer: Medicaid Other | Admitting: Urology

## 2020-12-07 ENCOUNTER — Other Ambulatory Visit: Payer: Self-pay

## 2020-12-07 DIAGNOSIS — Z435 Encounter for attention to cystostomy: Secondary | ICD-10-CM | POA: Diagnosis not present

## 2021-01-10 NOTE — Progress Notes (Signed)
Suprapubic Cath Change  Patient is present today for a suprapubic catheter change due to urinary retention.  40 ml of water was drained from the balloon, a 18 FR foley cath was removed from the tract with out difficulty.  Site was cleaned and prepped in a sterile fashion with betadine.  A 18 FR foley cath was replaced into the tract no complications were noted. Urine return was noted, 10 ml of sterile water was inflated into the balloon and a night bag was given to patient and proper instruction was given on how to switch bags.    Performed by: Myself and Lizbeth Bark, CMA  Follow up: one month for SPT exchange.  Advised to keep the Foley tube over the depends up towards the umbillicus to prevent further erosion.  They also need to flush the catheter daily to prevent sediment clogging.  Over filling the balloon does not help with clogging.

## 2021-01-11 ENCOUNTER — Ambulatory Visit (INDEPENDENT_AMBULATORY_CARE_PROVIDER_SITE_OTHER): Payer: Medicaid Other | Admitting: Urology

## 2021-01-11 ENCOUNTER — Other Ambulatory Visit: Payer: Self-pay

## 2021-01-11 DIAGNOSIS — Z435 Encounter for attention to cystostomy: Secondary | ICD-10-CM

## 2021-02-07 NOTE — Progress Notes (Signed)
Suprapubic Cath Change  Patient is present today for a suprapubic catheter change due to urinary retention.  10 ml of water was drained from the balloon, a 18 FR foley cath was removed from the tract with out difficulty.  Site was cleaned and prepped in a sterile fashion with betadine.  A 18 FR foley cath was replaced into the tract no complications were noted. Urine return was noted, 10 ml of sterile water was inflated into the balloon and a night bag was attached for drainage.  Patient tolerated well.   Performed by: Michiel Cowboy, PA-C and Lizbeth Bark, CMA  Follow up: One month for SPT exchange  Area of catheter erosion still persists despite multiple attempts to educate the facility on catheter erosion prevention.  It does not look infected at this visit.  Will continue to monitor.

## 2021-02-08 ENCOUNTER — Ambulatory Visit (INDEPENDENT_AMBULATORY_CARE_PROVIDER_SITE_OTHER): Payer: Medicaid Other | Admitting: Urology

## 2021-02-08 ENCOUNTER — Other Ambulatory Visit: Payer: Self-pay

## 2021-02-08 DIAGNOSIS — Z435 Encounter for attention to cystostomy: Secondary | ICD-10-CM

## 2021-03-13 NOTE — Progress Notes (Signed)
Suprapubic Cath Change  Patient is present today for a suprapubic catheter change due to urinary retention.  9 ml of water was drained from the balloon, a 18 FR foley cath was removed from the tract with some difficulty due to encrustation.  Site was cleaned and prepped in a sterile fashion with betadine.  A 18 FR foley cath was replaced into the tract no complications were noted. Urine return was noted, 10 ml of sterile water was inflated into the balloon and a night bag was attached to catheter.  Patient tolerated the procedure.     Performed by: Michiel Cowboy, PA-C and Honor Loh, CMA  Follow up: One month for SPT exchange.  Area of catheter erosion is starting to lesson.  It is now just contained to the area from the SPT site to the groin.

## 2021-03-14 ENCOUNTER — Ambulatory Visit (INDEPENDENT_AMBULATORY_CARE_PROVIDER_SITE_OTHER): Payer: Medicaid Other | Admitting: Urology

## 2021-03-14 ENCOUNTER — Other Ambulatory Visit: Payer: Self-pay

## 2021-03-14 DIAGNOSIS — Z435 Encounter for attention to cystostomy: Secondary | ICD-10-CM | POA: Diagnosis not present

## 2021-04-16 ENCOUNTER — Other Ambulatory Visit: Payer: Self-pay

## 2021-04-16 DIAGNOSIS — K5900 Constipation, unspecified: Secondary | ICD-10-CM | POA: Diagnosis present

## 2021-04-16 DIAGNOSIS — Z794 Long term (current) use of insulin: Secondary | ICD-10-CM

## 2021-04-16 DIAGNOSIS — Z981 Arthrodesis status: Secondary | ICD-10-CM

## 2021-04-16 DIAGNOSIS — Z9359 Other cystostomy status: Secondary | ICD-10-CM

## 2021-04-16 DIAGNOSIS — I10 Essential (primary) hypertension: Secondary | ICD-10-CM | POA: Diagnosis present

## 2021-04-16 DIAGNOSIS — Z79899 Other long term (current) drug therapy: Secondary | ICD-10-CM

## 2021-04-16 DIAGNOSIS — K3184 Gastroparesis: Secondary | ICD-10-CM | POA: Diagnosis present

## 2021-04-16 DIAGNOSIS — F17210 Nicotine dependence, cigarettes, uncomplicated: Secondary | ICD-10-CM | POA: Diagnosis present

## 2021-04-16 DIAGNOSIS — K209 Esophagitis, unspecified without bleeding: Secondary | ICD-10-CM | POA: Diagnosis present

## 2021-04-16 DIAGNOSIS — F419 Anxiety disorder, unspecified: Secondary | ICD-10-CM | POA: Diagnosis present

## 2021-04-16 DIAGNOSIS — F32A Depression, unspecified: Secondary | ICD-10-CM | POA: Diagnosis present

## 2021-04-16 DIAGNOSIS — E785 Hyperlipidemia, unspecified: Secondary | ICD-10-CM | POA: Diagnosis present

## 2021-04-16 DIAGNOSIS — Z6841 Body Mass Index (BMI) 40.0 and over, adult: Secondary | ICD-10-CM

## 2021-04-16 DIAGNOSIS — B182 Chronic viral hepatitis C: Secondary | ICD-10-CM | POA: Diagnosis present

## 2021-04-16 DIAGNOSIS — E1143 Type 2 diabetes mellitus with diabetic autonomic (poly)neuropathy: Principal | ICD-10-CM | POA: Diagnosis present

## 2021-04-16 DIAGNOSIS — G894 Chronic pain syndrome: Secondary | ICD-10-CM | POA: Diagnosis present

## 2021-04-16 DIAGNOSIS — Z20822 Contact with and (suspected) exposure to covid-19: Secondary | ICD-10-CM | POA: Diagnosis present

## 2021-04-16 DIAGNOSIS — E1165 Type 2 diabetes mellitus with hyperglycemia: Secondary | ICD-10-CM | POA: Diagnosis present

## 2021-04-16 DIAGNOSIS — Z9114 Patient's other noncompliance with medication regimen: Secondary | ICD-10-CM

## 2021-04-16 LAB — URINALYSIS, ROUTINE W REFLEX MICROSCOPIC
Bilirubin Urine: NEGATIVE
Glucose, UA: 150 mg/dL — AB
Hgb urine dipstick: NEGATIVE
Ketones, ur: 20 mg/dL — AB
Nitrite: NEGATIVE
Protein, ur: >=300 mg/dL — AB
Specific Gravity, Urine: 1.023 (ref 1.005–1.030)
Squamous Epithelial / HPF: NONE SEEN (ref 0–5)
pH: 9 — ABNORMAL HIGH (ref 5.0–8.0)

## 2021-04-16 MED ORDER — ONDANSETRON 4 MG PO TBDP
4.0000 mg | ORAL_TABLET | Freq: Once | ORAL | Status: AC
  Administered 2021-04-16: 4 mg via ORAL
  Filled 2021-04-16: qty 1

## 2021-04-16 NOTE — ED Triage Notes (Signed)
Patient arrives via EMS from [facility] for complaints of abdominal pain and vomiting x 1 day. Patient vomiting in triage.

## 2021-04-16 NOTE — ED Notes (Signed)
IV team at bedside 

## 2021-04-16 NOTE — ED Notes (Signed)
Phlebotomy reports they are unable to obtain blood from patient. Patient refusing to be stuck for labs again at this time.

## 2021-04-16 NOTE — ED Notes (Signed)
Lab contacted for blood collection after IV team was unable to obtain blood from IV. Phlebotomy to bedside.

## 2021-04-17 ENCOUNTER — Encounter: Payer: Self-pay | Admitting: Radiology

## 2021-04-17 ENCOUNTER — Emergency Department: Payer: Medicaid Other

## 2021-04-17 ENCOUNTER — Inpatient Hospital Stay
Admission: EM | Admit: 2021-04-17 | Discharge: 2021-04-25 | DRG: 074 | Disposition: A | Discharge status: Skilled Nursing Facility | Payer: Medicaid Other | Source: Skilled Nursing Facility | Attending: Internal Medicine | Admitting: Internal Medicine

## 2021-04-17 DIAGNOSIS — F17210 Nicotine dependence, cigarettes, uncomplicated: Secondary | ICD-10-CM | POA: Diagnosis present

## 2021-04-17 DIAGNOSIS — F32A Depression, unspecified: Secondary | ICD-10-CM | POA: Diagnosis present

## 2021-04-17 DIAGNOSIS — E1143 Type 2 diabetes mellitus with diabetic autonomic (poly)neuropathy: Secondary | ICD-10-CM | POA: Diagnosis not present

## 2021-04-17 DIAGNOSIS — K209 Esophagitis, unspecified without bleeding: Secondary | ICD-10-CM | POA: Diagnosis present

## 2021-04-17 DIAGNOSIS — K3184 Gastroparesis: Secondary | ICD-10-CM | POA: Diagnosis present

## 2021-04-17 DIAGNOSIS — B182 Chronic viral hepatitis C: Secondary | ICD-10-CM | POA: Diagnosis present

## 2021-04-17 DIAGNOSIS — F419 Anxiety disorder, unspecified: Secondary | ICD-10-CM | POA: Diagnosis present

## 2021-04-17 DIAGNOSIS — E1165 Type 2 diabetes mellitus with hyperglycemia: Secondary | ICD-10-CM

## 2021-04-17 DIAGNOSIS — N3000 Acute cystitis without hematuria: Secondary | ICD-10-CM

## 2021-04-17 DIAGNOSIS — E785 Hyperlipidemia, unspecified: Secondary | ICD-10-CM | POA: Diagnosis present

## 2021-04-17 DIAGNOSIS — Z6841 Body Mass Index (BMI) 40.0 and over, adult: Secondary | ICD-10-CM | POA: Diagnosis not present

## 2021-04-17 DIAGNOSIS — N390 Urinary tract infection, site not specified: Secondary | ICD-10-CM | POA: Diagnosis present

## 2021-04-17 DIAGNOSIS — R112 Nausea with vomiting, unspecified: Secondary | ICD-10-CM

## 2021-04-17 DIAGNOSIS — Z79899 Other long term (current) drug therapy: Secondary | ICD-10-CM | POA: Diagnosis not present

## 2021-04-17 DIAGNOSIS — R1084 Generalized abdominal pain: Secondary | ICD-10-CM

## 2021-04-17 DIAGNOSIS — G894 Chronic pain syndrome: Secondary | ICD-10-CM | POA: Diagnosis present

## 2021-04-17 DIAGNOSIS — A419 Sepsis, unspecified organism: Secondary | ICD-10-CM

## 2021-04-17 DIAGNOSIS — Z981 Arthrodesis status: Secondary | ICD-10-CM | POA: Diagnosis not present

## 2021-04-17 DIAGNOSIS — Z794 Long term (current) use of insulin: Secondary | ICD-10-CM | POA: Diagnosis not present

## 2021-04-17 DIAGNOSIS — Z9359 Other cystostomy status: Secondary | ICD-10-CM

## 2021-04-17 DIAGNOSIS — K5900 Constipation, unspecified: Secondary | ICD-10-CM | POA: Diagnosis present

## 2021-04-17 DIAGNOSIS — I10 Essential (primary) hypertension: Secondary | ICD-10-CM

## 2021-04-17 DIAGNOSIS — Z20822 Contact with and (suspected) exposure to covid-19: Secondary | ICD-10-CM | POA: Diagnosis present

## 2021-04-17 DIAGNOSIS — Z9114 Patient's other noncompliance with medication regimen: Secondary | ICD-10-CM | POA: Diagnosis not present

## 2021-04-17 LAB — CBC WITH DIFFERENTIAL/PLATELET
Abs Immature Granulocytes: 0.03 10*3/uL (ref 0.00–0.07)
Basophils Absolute: 0 10*3/uL (ref 0.0–0.1)
Basophils Relative: 0 %
Eosinophils Absolute: 0 10*3/uL (ref 0.0–0.5)
Eosinophils Relative: 0 %
HCT: 38.5 % — ABNORMAL LOW (ref 39.0–52.0)
Hemoglobin: 13.7 g/dL (ref 13.0–17.0)
Immature Granulocytes: 0 %
Lymphocytes Relative: 8 %
Lymphs Abs: 0.7 10*3/uL (ref 0.7–4.0)
MCH: 32.5 pg (ref 26.0–34.0)
MCHC: 35.6 g/dL (ref 30.0–36.0)
MCV: 91.2 fL (ref 80.0–100.0)
Monocytes Absolute: 0.3 10*3/uL (ref 0.1–1.0)
Monocytes Relative: 3 %
Neutro Abs: 7.6 10*3/uL (ref 1.7–7.7)
Neutrophils Relative %: 89 %
Platelets: 201 10*3/uL (ref 150–400)
RBC: 4.22 MIL/uL (ref 4.22–5.81)
RDW: 11.8 % (ref 11.5–15.5)
WBC: 8.7 10*3/uL (ref 4.0–10.5)
nRBC: 0 % (ref 0.0–0.2)

## 2021-04-17 LAB — COMPREHENSIVE METABOLIC PANEL
ALT: 56 U/L — ABNORMAL HIGH (ref 0–44)
AST: 30 U/L (ref 15–41)
Albumin: 3.6 g/dL (ref 3.5–5.0)
Alkaline Phosphatase: 78 U/L (ref 38–126)
Anion gap: 11 (ref 5–15)
BUN: 16 mg/dL (ref 6–20)
CO2: 22 mmol/L (ref 22–32)
Calcium: 8.7 mg/dL — ABNORMAL LOW (ref 8.9–10.3)
Chloride: 102 mmol/L (ref 98–111)
Creatinine, Ser: 1.15 mg/dL (ref 0.61–1.24)
GFR, Estimated: >60 mL/min (ref >60)
Glucose, Bld: 315 mg/dL — ABNORMAL HIGH (ref 70–99)
Potassium: 3.8 mmol/L (ref 3.5–5.1)
Sodium: 135 mmol/L (ref 135–145)
Total Bilirubin: 1 mg/dL (ref 0.3–1.2)
Total Protein: 8 g/dL (ref 6.5–8.1)

## 2021-04-17 LAB — RESP PANEL BY RT-PCR (FLU A&B, COVID) ARPGX2
Influenza A by PCR: NEGATIVE
Influenza B by PCR: NEGATIVE
SARS Coronavirus 2 by RT PCR: NEGATIVE

## 2021-04-17 LAB — CBG MONITORING, ED
Glucose-Capillary: 202 mg/dL — ABNORMAL HIGH (ref 70–99)
Glucose-Capillary: 138 mg/dL — ABNORMAL HIGH (ref 70–99)
Glucose-Capillary: 167 mg/dL — ABNORMAL HIGH (ref 70–99)
Glucose-Capillary: 232 mg/dL — ABNORMAL HIGH (ref 70–99)

## 2021-04-17 LAB — PROCALCITONIN: Procalcitonin: <0.1 ng/mL

## 2021-04-17 LAB — LACTIC ACID, PLASMA
Lactic Acid, Venous: 2.1 mmol/L (ref 0.5–1.9)
Lactic Acid, Venous: 1.5 mmol/L (ref 0.5–1.9)

## 2021-04-17 LAB — LIPASE, BLOOD: Lipase: 19 U/L (ref 11–51)

## 2021-04-17 LAB — HEMOGLOBIN A1C
Hgb A1c MFr Bld: 7.9 % — ABNORMAL HIGH (ref 4.8–5.6)
Mean Plasma Glucose: 180 mg/dL

## 2021-04-17 LAB — HIV ANTIBODY (ROUTINE TESTING W REFLEX): HIV Screen 4th Generation wRfx: NONREACTIVE

## 2021-04-17 LAB — GLUCOSE, CAPILLARY: Glucose-Capillary: 222 mg/dL — ABNORMAL HIGH (ref 70–99)

## 2021-04-17 MED ORDER — SODIUM CHLORIDE 0.9 % IV SOLN
2.0000 g | Freq: Once | INTRAVENOUS | Status: DC
  Administered 2021-04-17: 2 g via INTRAVENOUS
  Filled 2021-04-17: qty 2

## 2021-04-17 MED ORDER — SODIUM CHLORIDE 0.9 % IV SOLN
25.0000 mg | Freq: Once | INTRAVENOUS | Status: AC
  Administered 2021-04-17: 25 mg via INTRAVENOUS
  Filled 2021-04-17: qty 25

## 2021-04-17 MED ORDER — METOCLOPRAMIDE HCL 5 MG/ML IJ SOLN
5.0000 mg | Freq: Once | INTRAMUSCULAR | Status: AC
  Administered 2021-04-17: 5 mg via INTRAVENOUS
  Filled 2021-04-17: qty 2

## 2021-04-17 MED ORDER — SODIUM CHLORIDE 0.9 % IV BOLUS (SEPSIS)
500.0000 mL | Freq: Once | INTRAVENOUS | Status: AC
  Administered 2021-04-17: 500 mL via INTRAVENOUS

## 2021-04-17 MED ORDER — MORPHINE SULFATE (PF) 4 MG/ML IV SOLN
4.0000 mg | Freq: Once | INTRAVENOUS | Status: AC
Start: 2021-04-17 — End: 2021-04-17
  Administered 2021-04-17: 4 mg via INTRAVENOUS
  Filled 2021-04-17: qty 1

## 2021-04-17 MED ORDER — ENOXAPARIN SODIUM 60 MG/0.6ML IJ SOSY
0.5000 mg/kg | PREFILLED_SYRINGE | INTRAMUSCULAR | Status: DC
  Administered 2021-04-17 – 2021-04-25 (×6): 55 mg via SUBCUTANEOUS
  Filled 2021-04-17 (×8): qty 0.6

## 2021-04-17 MED ORDER — SODIUM CHLORIDE 0.9 % IV BOLUS (SEPSIS)
1000.0000 mL | Freq: Once | INTRAVENOUS | Status: AC
  Administered 2021-04-17: 1000 mL via INTRAVENOUS

## 2021-04-17 MED ORDER — ACETAMINOPHEN 650 MG RE SUPP: 650.0000 mg | Freq: Four times a day (QID) | RECTAL | Status: DC | PRN

## 2021-04-17 MED ORDER — SODIUM CHLORIDE 0.9 % IV SOLN
12.5000 mg | Freq: Four times a day (QID) | INTRAVENOUS | Status: DC | PRN
  Administered 2021-04-17 – 2021-04-21 (×8): 12.5 mg via INTRAVENOUS
  Filled 2021-04-17 (×2): qty 12.5
  Filled 2021-04-17: qty 0.5
  Filled 2021-04-17 (×2): qty 12.5
  Filled 2021-04-17: qty 0.5
  Filled 2021-04-17: qty 12.5
  Filled 2021-04-17: qty 0.5
  Filled 2021-04-17 (×2): qty 12.5

## 2021-04-17 MED ORDER — SODIUM CHLORIDE 0.9 % IV SOLN: 2.0000 g | Freq: Three times a day (TID) | INTRAVENOUS | Status: DC

## 2021-04-17 MED ORDER — SODIUM CHLORIDE 0.9 % IV BOLUS
1000.0000 mL | Freq: Once | INTRAVENOUS | Status: AC
  Administered 2021-04-17: 1000 mL via INTRAVENOUS

## 2021-04-17 MED ORDER — INSULIN ASPART 100 UNIT/ML IJ SOLN
0.0000 [IU] | INTRAMUSCULAR | Status: DC
Start: 2021-04-17 — End: 2021-04-25
  Administered 2021-04-17: 4 [IU] via SUBCUTANEOUS
  Administered 2021-04-17: 15 [IU] via SUBCUTANEOUS
  Administered 2021-04-17: 3 [IU] via SUBCUTANEOUS
  Administered 2021-04-17 – 2021-04-19 (×8): 7 [IU] via SUBCUTANEOUS
  Administered 2021-04-19: 4 [IU] via SUBCUTANEOUS
  Administered 2021-04-19: 7 [IU] via SUBCUTANEOUS
  Administered 2021-04-20 (×2): 4 [IU] via SUBCUTANEOUS
  Administered 2021-04-20 (×2): 7 [IU] via SUBCUTANEOUS
  Administered 2021-04-20 – 2021-04-21 (×4): 4 [IU] via SUBCUTANEOUS
  Administered 2021-04-21: 7 [IU] via SUBCUTANEOUS
  Administered 2021-04-21 (×2): 4 [IU] via SUBCUTANEOUS
  Administered 2021-04-21: 7 [IU] via SUBCUTANEOUS
  Administered 2021-04-22 (×2): 4 [IU] via SUBCUTANEOUS
  Administered 2021-04-22: 3 [IU] via SUBCUTANEOUS
  Administered 2021-04-22: 4 [IU] via SUBCUTANEOUS
  Administered 2021-04-23: 3 [IU] via SUBCUTANEOUS
  Administered 2021-04-23: 4 [IU] via SUBCUTANEOUS
  Administered 2021-04-23 (×3): 3 [IU] via SUBCUTANEOUS
  Administered 2021-04-24: 11 [IU] via SUBCUTANEOUS
  Administered 2021-04-24 – 2021-04-25 (×3): 4 [IU] via SUBCUTANEOUS
  Filled 2021-04-17 (×38): qty 1

## 2021-04-17 MED ORDER — SODIUM CHLORIDE 0.9 % IV SOLN: INTRAVENOUS | Status: DC

## 2021-04-17 MED ORDER — ONDANSETRON HCL 4 MG PO TABS
4.0000 mg | ORAL_TABLET | Freq: Four times a day (QID) | ORAL | Status: DC | PRN
  Administered 2021-04-21: 4 mg via ORAL
  Filled 2021-04-17: qty 1

## 2021-04-17 MED ORDER — FAMOTIDINE IN NACL 20-0.9 MG/50ML-% IV SOLN
20.0000 mg | Freq: Once | INTRAVENOUS | Status: AC
  Administered 2021-04-17: 20 mg via INTRAVENOUS
  Filled 2021-04-17: qty 50

## 2021-04-17 MED ORDER — LACTATED RINGERS IV SOLN: INTRAVENOUS | Status: DC

## 2021-04-17 MED ORDER — ACETAMINOPHEN 325 MG PO TABS: 650.0000 mg | ORAL_TABLET | Freq: Four times a day (QID) | ORAL | Status: DC | PRN

## 2021-04-17 MED ORDER — METOCLOPRAMIDE HCL 5 MG/ML IJ SOLN
10.0000 mg | Freq: Four times a day (QID) | INTRAMUSCULAR | Status: AC
  Administered 2021-04-17 (×4): 10 mg via INTRAVENOUS
  Filled 2021-04-17 (×4): qty 2

## 2021-04-17 MED ORDER — ONDANSETRON HCL 4 MG/2ML IJ SOLN
4.0000 mg | Freq: Four times a day (QID) | INTRAMUSCULAR | Status: DC | PRN
  Administered 2021-04-17 – 2021-04-23 (×17): 4 mg via INTRAVENOUS
  Filled 2021-04-17 (×18): qty 2

## 2021-04-17 MED ORDER — MORPHINE SULFATE (PF) 4 MG/ML IV SOLN
4.0000 mg | Freq: Once | INTRAVENOUS | Status: AC
  Administered 2021-04-17: 4 mg via INTRAVENOUS
  Filled 2021-04-17: qty 1

## 2021-04-17 NOTE — ED Notes (Signed)
Went in to see patient, gave medications, drew his blood. Resting comfortable

## 2021-04-17 NOTE — ED Notes (Signed)
Pt provided with dinner tray at this time. Pt sleeping and states he does not want to eat at this time. Tray left at bedside for pt.

## 2021-04-17 NOTE — ED Notes (Signed)
Patient transported to floor via stretcher by RN

## 2021-04-17 NOTE — Progress Notes (Signed)
Pharmacy Antibiotic Note  Eugene [name] is a 60 y.o. male admitted on 04/17/2021 with UTI.  Pharmacy has been consulted for Cefepime dosing.  Plan: Cefepime 2 gm IV X 1 given in ED on 7/25 @ 0422. Cefepime 2 gm IV Q8H ordered to continue on 7/25 @ 1200.    Height: 5' (152.4 cm) Weight: 109 kg (240 lb 4.8 oz) IBW/kg (Calculated) : 50  Temp (24hrs), Avg:98.4 F (36.9 C), Min:98.2 F (36.8 C), Max:98.6 F (37 C)  Recent Labs  Lab 04/17/21 0215  WBC 8.7  CREATININE 1.15  LATICACIDVEN 2.1*    Estimated Creatinine Clearance: 72.9 mL/min (by C-G formula based on SCr of 1.15 mg/dL).    No Known Allergies  Antimicrobials this admission:   >>    >>   Dose adjustments this admission:   Microbiology results:  BCx:   UCx:    Sputum:    MRSA PCR:   Thank you for allowing pharmacy to be a part of this patient's care.  Alveena Taira D 04/17/2021 4:29 AM

## 2021-04-17 NOTE — ED Notes (Signed)
Meal tray provided to pt at this time.

## 2021-04-17 NOTE — ED Notes (Signed)
Pt with near constant vomiting in triage, pt appears distressed, will bed next.

## 2021-04-17 NOTE — H&P (Signed)
History and Physical    Eugene [name] UEA:[id number] DOB: 1961/03/23 DOA: 04/17/2021  PCP: Abner GreenspanHodges, Beth, MD   Patient coming from: SNF  I have personally briefly reviewed [facility]  Chief Complaint: vomiting  HPI: Eugene [name] is a 60 y.o. male with medical history significant for Type 2 diabetes with gastroparesis, IBS, hepatitis C, anxiety and depression, chronic suprapubic catheter, recurrent fecal impaction who was sent to the ED for evaluation of intractable nausea and vomiting that started earlier in the day.  His symptoms feel similar to prior episodes of gastroparesis.  He has diffuse abdominal pain.  Denies fever.  Denies diarrhea.  Has no cough or chest pain  ED course: On arrival afebrile, BP 166/58 pulse 70 O2 sat 97% on room air CBC unremarkable CMP unremarkable except for blood sugar of 319, lactic acid 2.1.  Lipase normal at 19.  Urinalysis with large leukocyte esterase and many bacteria.  EKG, personally viewed and interpreted: NSR at 72 with no acute ST-T wave changes  Imaging: CT abdomen and pelvis showing thickening of the distal esophagus which may represent mild to moderate severity esophageal Titus as well as mild distal fecal impaction Chest x-ray no active disease  Patient treated with Reglan, Phenergan and Zofran as well as morphine.  Also treated with cefepime and IV fluid boluses for possible UTI.  Hospitalist consulted for admission.  Review of Systems: As per HPI otherwise all other systems on review of systems negative.    Past Medical History:  Diagnosis Date   Anxiety    Depression    Diabetes mellitus without complication (HCC)    Hepatitis C    Hyperlipidemia    IBS (irritable bowel syndrome)    Osteomyelitis (HCC)    Spinal stenosis     Past Surgical History:  Procedure Laterality Date   BACK SURGERY     bilateral amputation Bilateral    CENTRAL LINE INSERTION-TUNNELED N/A 02/02/2019   Procedure:  CENTRAL LINE INSERTION-TUNNELED;  Surgeon: Annice Needyew, Jason S, MD;  Location: [facility] INVASIVE CV LAB;  Service: Cardiovascular;  Laterality: N/A;   COLONOSCOPY WITH PROPOFOL N/A 01/12/2020   Procedure: COLONOSCOPY WITH PROPOFOL;  Surgeon: Midge MiniumWohl, Darren, MD;  [facility];  Service: Endoscopy;  Laterality: N/A;   ESOPHAGOGASTRODUODENOSCOPY (EGD) WITH PROPOFOL N/A 12/07/2019   Procedure: ESOPHAGOGASTRODUODENOSCOPY (EGD) WITH PROPOFOL;  Surgeon: Toney ReilVanga, Rohini Reddy, MD;  [facility];  Service: Gastroenterology;  Laterality: N/A;   IR CATHETER TUBE CHANGE  12/04/2019   SPINAL FUSION     THORACIC SPINE SURGERY  01/2019   extensive washout     reports that he has been smoking cigarettes. He has been smoking an average of .25 packs per day. He has never used smokeless tobacco. He reports previous alcohol use. He reports previous drug use.  No Known Allergies  History reviewed. No pertinent family history.    Prior to Admission medications   Medication Sig Start Date End Date Taking? Authorizing Provider  acetaminophen (TYLENOL) 325 MG tablet Take 650 mg by mouth every 4 (four) hours as needed for mild pain or fever.    [provider]  amLODipine (NORVASC) 10 MG tablet Take 10 mg by mouth daily.    [provider]  bisacodyl (DULCOLAX) 10 MG suppository Place 10 mg rectally daily as needed for moderate constipation.    [provider]  famotidine (PEPCID) 20 MG tablet Take 20 mg by mouth daily.     [provider]  ferrous gluconate (FERGON) 324 MG tablet Take 324 mg by mouth 2 (two) times daily with a meal.     [provider]  furosemide (LASIX) 40 MG tablet Take 40 mg by mouth 2 (two) times daily.    [provider]  hydrALAZINE (APRESOLINE) 50 MG tablet Take 50 mg by mouth 3 (three) times daily.    [provider]  insulin glargine (LANTUS) 100 UNIT/ML injection Inject 20 Units into the skin at bedtime.     [provider]  insulin lispro (HUMALOG) 100 UNIT/ML injection Inject 0-15 Units into the skin 4 (four) times daily -  before meals and at bedtime. <150= 0 units 151-200= 3 units 201-250= 6 units 251-300= 9 units 301-350= 12 units 351-400= 15 units >400 call MD    [provider]  linaclotide (LINZESS) 145 MCG CAPS capsule Take 1 capsule (145 mcg total) by mouth daily before breakfast. 10/25/19   Danford, Earl Lites, MD  loratadine (CLARITIN) 10 MG tablet Take 10 mg by mouth daily.    [provider]  Melatonin 5 MG TABS Take 5 mg by mouth at bedtime.    [provider]  methocarbamol (ROBAXIN) 500 MG tablet Take 1,000 mg by mouth 4 (four) times daily.     [provider]  metoCLOPramide (REGLAN) 10 MG tablet Take 1 tablet (10 mg total) by mouth 4 (four) times daily -  before meals and at bedtime. 12/14/19   Arnetha Courser, MD  metoprolol succinate (TOPROL-XL) 25 MG 24 hr tablet Take 25 mg by mouth daily.    [provider]  nicotine (NICODERM CQ - DOSED IN MG/24 HOURS) 21 mg/24hr patch Place 21 mg onto the skin daily.    [provider]  ondansetron (ZOFRAN) 4 MG tablet Take 4 mg by mouth every 6 (six) hours as needed for nausea or vomiting.    [provider]  oxyCODONE ER 9 MG C12A Take 9 mg by mouth 2 (two) times daily. 10/24/19   Danford, Earl Lites, MD  Oxycodone HCl 10 MG TABS Take 1 tablet (10 mg total) by mouth every 4 (four) hours as needed. Patient taking differently: Take 10 mg by mouth every 4 (four) hours as needed (pain).  10/24/19   Danford, Earl Lites, MD  pantoprazole (PROTONIX) 40 MG injection Inject 40 mg into the vein every 12 (twelve) hours. 12/14/19   Arnetha Courser, MD  polyethylene glycol (MIRALAX / GLYCOLAX) 17 g packet Take 17 g by mouth 2 (two) times daily. 10/24/19   Danford, Earl Lites, MD  pregabalin (LYRICA) 75 MG capsule Take 75 mg by mouth 2 (two) times daily.    [provider]   senna-docusate (SENOKOT-S) 8.6-50 MG tablet Take 1 tablet by mouth 2 (two) times daily. 10/24/19   Danford, Earl Lites, MD  sodium phosphate Pediatric (FLEET) 3.5-9.5 GM/59ML enema Place 1 enema rectally every other day as needed for severe constipation.    [provider]  traZODone (DESYREL) 50 MG tablet Take 50 mg by mouth at bedtime.     [provider]    Physical Exam: Vitals:   04/17/21 0230 04/17/21 0300 04/17/21 0330 04/17/21 0400  BP:  (!) 155/60 (!) 160/54 (!) 158/62  Pulse: 64 89 66   Resp: 14 17 (!) 21   Temp:  98.2 F (36.8 C)    TempSrc:  Oral    SpO2: 95% 93% 90%   Weight:      Height:  Vitals:   04/17/21 0230 04/17/21 0300 04/17/21 0330 04/17/21 0400  BP:  (!) 155/60 (!) 160/54 (!) 158/62  Pulse: 64 89 66   Resp: 14 17 (!) 21   Temp:  98.2 F (36.8 C)    TempSrc:  Oral    SpO2: 95% 93% 90%   Weight:      Height:          Constitutional: Alert and oriented x 3 .  Ill-appearing HEENT:      Head: Normocephalic and atraumatic.         Eyes: PERLA, EOMI, Conjunctivae are normal. Sclera is non-icteric.       Mouth/Throat: Mucous membranes are moist.       Neck: Supple with no signs of meningismus. Cardiovascular: Regular rate and rhythm. No murmurs, gallops, or rubs. 2+ symmetrical distal pulses are present . No JVD. No LE edema Respiratory: Respiratory effort normal .Lungs sounds clear bilaterally. No wheezes, crackles, or rhonchi.  Gastrointestinal: Soft, non tender, and non distended with positive bowel sounds.  Suprapubic catheter Genitourinary: No CVA tenderness. Musculoskeletal: Nontender with normal range of motion in all extremities. No cyanosis, or erythema of extremities. Neurologic:  Face is symmetric. Moving all extremities. No gross focal neurologic deficits . Skin: Skin is warm, dry.  No rash or ulcers Psychiatric: Mood and affect are normal    Labs on Admission: I have personally reviewed following labs and  imaging studies  CBC: Recent Labs  Lab 04/17/21 0215  WBC 8.7  NEUTROABS 7.6  HGB 13.7  HCT 38.5*  MCV 91.2  PLT 201   Basic Metabolic Panel: Recent Labs  Lab 04/17/21 0215  NA 135  K 3.8  CL 102  CO2 22  GLUCOSE 315*  BUN 16  CREATININE 1.15  CALCIUM 8.7*   GFR: Estimated Creatinine Clearance: 72.9 mL/min (by C-G formula based on SCr of 1.15 mg/dL). Liver Function Tests: Recent Labs  Lab 04/17/21 0215  AST 30  ALT 56*  ALKPHOS 78  BILITOT 1.0  PROT 8.0  ALBUMIN 3.6   Recent Labs  Lab 04/17/21 0215  LIPASE 19   No results for input(s): AMMONIA in the last 168 hours. Coagulation Profile: No results for input(s): INR, PROTIME in the last 168 hours. Cardiac Enzymes: No results for input(s): CKTOTAL, CKMB, CKMBINDEX, TROPONINI in the last 168 hours. BNP (last 3 results) No results for input(s): PROBNP in the last 8760 hours. HbA1C: No results for input(s): HGBA1C in the last 72 hours. CBG: No results for input(s): GLUCAP in the last 168 hours. Lipid Profile: No results for input(s): CHOL, HDL, LDLCALC, TRIG, CHOLHDL, LDLDIRECT in the last 72 hours. Thyroid Function Tests: No results for input(s): TSH, T4TOTAL, FREET4, T3FREE, THYROIDAB in the last 72 hours. Anemia Panel: No results for input(s): VITAMINB12, FOLATE, FERRITIN, TIBC, IRON, RETICCTPCT in the last 72 hours. Urine analysis:    Component Value Date/Time   COLORURINE YELLOW (A) 04/16/2021 2128   APPEARANCEUR CLOUDY (A) 04/16/2021 2128   LABSPEC 1.023 04/16/2021 2128   PHURINE 9.0 (H) 04/16/2021 2128   GLUCOSEU 150 (A) 04/16/2021 2128   HGBUR NEGATIVE 04/16/2021 2128   BILIRUBINUR NEGATIVE 04/16/2021 2128   KETONESUR 20 (A) 04/16/2021 2128   PROTEINUR >=300 (A) 04/16/2021 2128   NITRITE NEGATIVE 04/16/2021 2128   LEUKOCYTESUR LARGE (A) 04/16/2021 2128    Radiological Exams on Admission: CT Abdomen Pelvis Wo Contrast  Result Date: 04/17/2021 CLINICAL DATA:  Abdominal pain and  vomiting. EXAM: CT ABDOMEN AND PELVIS  WITHOUT CONTRAST TECHNIQUE: Multidetector CT imaging of the abdomen and pelvis was performed following the standard protocol without IV contrast. COMPARISON:  December 02, 2019 FINDINGS: Lower chest: Mild linear atelectasis is seen within the bilateral lung bases. Hepatobiliary: No focal liver abnormality is seen. No gallstones, gallbladder wall thickening, or biliary dilatation. Pancreas: Unremarkable. No pancreatic ductal dilatation or surrounding inflammatory changes. Spleen: Normal in size without focal abnormality. Adrenals/Urinary Tract: Adrenal glands are unremarkable. Kidneys are normal, without renal calculi, focal lesion, or hydronephrosis. A suprapubic catheter is seen within a contracted urinary bladder. Mild diffuse urinary bladder wall thickening is also noted. Stomach/Bowel: There is mild to moderate severity thickening of the visualized portion of the distal esophagus. Stomach is within normal limits. Appendix appears normal. A moderate amount of stool is seen within the distal large bowel. No evidence of bowel wall thickening, distention, or inflammatory changes. Vascular/Lymphatic: Aortic atherosclerosis. Stable subcentimeter para-aortic, aortocaval and mesenteric lymph nodes are seen. Reproductive: Prostate is unremarkable. Other: There is a 3.8 cm x 3.5 cm fat containing left paraumbilical hernia. No abdominopelvic ascites. Musculoskeletal: A chronic compression fracture deformity is seen at the level of L1 vertebral body. Metallic density operative hardware is seen within the lower thoracic spine. IMPRESSION: 1. Thickening of the distal esophagus which may represent sequelae associated with mild to moderate severity esophagitis. 2. Mild distal fecal impaction. 3. Postoperative changes within lower thoracic spine. Electronically Signed   By: Aram Candela M.D.   On: 04/17/2021 02:34   DG Chest Port 1 View  Result Date: 04/17/2021 CLINICAL DATA:   Abdominal pain and vomiting x1 day. EXAM: PORTABLE CHEST 1 VIEW COMPARISON:  October 21, 2019 FINDINGS: The heart size and mediastinal contours are within normal limits. Both lungs are clear. Stable postoperative changes are seen within the mid and lower thoracic spine. IMPRESSION: Stable exam without acute or active cardiopulmonary disease. Electronically Signed   By: Aram Candela M.D.   On: 04/17/2021 02:35     Assessment/Plan 60 year old male with history of type 2 diabetes with gastroparesis, HTN, hepatitis C, anxiety and depression, chronic suprapubic catheter, recurrent fecal impaction presenting with intractable vomiting .  Intractable vomiting secondary to diabetic gastroparesis (HCC) -N.p.o. except for ice chips - Zofran as needed, Reglan every 6, Phenergan for refractory vomiting - Consider IV erythromycin - GI consult for additional recommendations if not improving - IV hydration    Type 2 diabetes mellitus with hyperglycemia, with long-term current use of insulin (HCC) - Sliding scale insulin coverage    UTI (urinary tract infection)   Suprapubic catheter (HCC) - Follow cultures - We will continue cefepime pending culture    HTN (hypertension) - IV hydralazine until able to tolerate orally    DVT prophylaxis: Lovenox  Code Status: full code  Family Communication:  none  Disposition Plan: Back to previous home environment Consults called: none  Status:At the time of admission, it appears that the appropriate admission status for this patient is INPATIENT. This is judged to be reasonable and necessary in order to provide the required intensity of service to ensure the patient's safety given the presenting symptoms, physical exam findings, and initial radiographic and laboratory data in the context of their  Comorbid conditions.   Patient requires inpatient status due to high intensity of service, high risk for further deterioration and high frequency of surveillance  required.   I certify that at the point of admission it is my clinical judgment that the patient will require inpatient hospital care spanning  beyond 2 midnights     Andris Baumann MD [facility]     04/17/2021, 4:13 AM

## 2021-04-17 NOTE — ED Provider Notes (Signed)
[facility] Emergency Department Provider Note   ____________________________________________   Event Date/Time   First MD Initiated Contact with Patient 04/17/21 0107     (approximate)  I have reviewed the triage vital signs and the nursing notes.   HISTORY  Chief Complaint Abdominal Pain and Emesis    HPI Eugene [name] is a 60 y.o. male brought to the ED via EMS from [facility] healthcare with a chief complaint of abdominal pain and vomiting.  Patient has a history of diabetic gastroparesis, hyperlipidemia, indwelling suprapubic catheter who states he has these episodes about every 6 to 8 months.  Feels like he is having symptoms of his diabetic gastroparesis.  Began vomiting this morning with generalized abdominal pain.  Denies fever, cough, chest pain, shortness of breath, diarrhea.     Past Medical History:  Diagnosis Date   Anxiety    Depression    Diabetes mellitus without complication (HCC)    Hepatitis C    Hyperlipidemia    IBS (irritable bowel syndrome)    Osteomyelitis (HCC)    Spinal stenosis     Patient Active Problem List   Diagnosis Date Noted   Diabetic gastroparesis (HCC) 04/17/2021   Suprapubic catheter dysfunction (HCC)    Pain    Constipation by delayed colonic transit    Nausea and vomiting 10/21/2019   Suprapubic catheter (HCC)    Sepsis (HCC) 10/02/2019   Thoracic spinal stenosis 02/12/2019   Osteomyelitis of thoracic spine (HCC) 02/08/2019   Peripheral vascular disease (HCC) 02/08/2019   HTN (hypertension) 02/08/2019   Dyslipidemia 02/08/2019   Chronic pain syndrome 02/08/2019   Polysubstance abuse (HCC) 02/08/2019   Chronic hepatitis C without hepatic coma (HCC) 02/08/2019   GERD (gastroesophageal reflux disease) 02/08/2019   Cigarette smoker 02/08/2019   Normocytic anemia 02/08/2019   UTI (urinary tract infection) 02/07/2019   AKI (acute kidney injury) (HCC) 02/07/2019   Hyperkalemia 02/07/2019   Pressure  injury of skin 02/07/2019   Hyperlipidemia 11/07/2018   Drug-seeking behavior 11/07/2018   Abnormality of gait 10/17/2018   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   Abdominal pain 03/24/2018   Acute pain of left knee 01/23/2018   MRSA (methicillin resistant Staphylococcus aureus) septicemia (HCC) 08/17/2017   Bacteremia due to Staphylococcus aureus 08/15/2017   Other acute osteomyelitis, left ankle and foot (HCC) 06/29/2017   Diabetic ulcer of right foot associated with diabetes mellitus due to underlying condition (HCC) 06/27/2017   Cellulitis of left foot 06/27/2017   Tobacco use disorder 06/27/2017   Type 2 diabetes mellitus with hyperglycemia, with long-term current use of insulin (HCC) 06/18/2017    Past Surgical History:  Procedure Laterality Date   BACK SURGERY     bilateral amputation Bilateral    CENTRAL LINE INSERTION-TUNNELED N/A 02/02/2019   Procedure: CENTRAL LINE INSERTION-TUNNELED;  Surgeon: Annice Needy, MD;  Location: [facility] INVASIVE CV LAB;  Service: Cardiovascular;  Laterality: N/A;   COLONOSCOPY WITH PROPOFOL N/A 01/12/2020   Procedure: COLONOSCOPY WITH PROPOFOL;  Surgeon: Midge Minium, MD;  Location: [facility] ENDOSCOPY;  Service: Endoscopy;  Laterality: N/A;   ESOPHAGOGASTRODUODENOSCOPY (EGD) WITH PROPOFOL N/A 12/07/2019   Procedure: ESOPHAGOGASTRODUODENOSCOPY (EGD) WITH PROPOFOL;  Surgeon: Toney Reil, MD;  Location: [facility] ENDOSCOPY;  Service: Gastroenterology;  Laterality: N/A;   IR CATHETER TUBE CHANGE  12/04/2019   SPINAL FUSION     THORACIC SPINE SURGERY  01/2019   extensive washout    Prior to Admission medications   Medication Sig Start Date End  Date Taking? Authorizing Provider  acetaminophen (TYLENOL) 325 MG tablet Take 650 mg by mouth every 4 (four) hours as needed for mild pain or fever.    [provider]  amLODipine (NORVASC) 10 MG tablet Take 10 mg by mouth daily.    [provider]  bisacodyl (DULCOLAX) 10 MG suppository Place 10 mg  rectally daily as needed for moderate constipation.    [provider]  famotidine (PEPCID) 20 MG tablet Take 20 mg by mouth daily.     [provider]  ferrous gluconate (FERGON) 324 MG tablet Take 324 mg by mouth 2 (two) times daily with a meal.     [provider]  furosemide (LASIX) 40 MG tablet Take 40 mg by mouth 2 (two) times daily.    [provider]  hydrALAZINE (APRESOLINE) 50 MG tablet Take 50 mg by mouth 3 (three) times daily.    [provider]  insulin glargine (LANTUS) 100 UNIT/ML injection Inject 20 Units into the skin at bedtime.     [provider]  insulin lispro (HUMALOG) 100 UNIT/ML injection Inject 0-15 Units into the skin 4 (four) times daily -  before meals and at bedtime. <150= 0 units 151-200= 3 units 201-250= 6 units 251-300= 9 units 301-350= 12 units 351-400= 15 units >400 call MD    [provider]  linaclotide (LINZESS) 145 MCG CAPS capsule Take 1 capsule (145 mcg total) by mouth daily before breakfast. 10/25/19   Danford, Earl Lites, MD  loratadine (CLARITIN) 10 MG tablet Take 10 mg by mouth daily.    [provider]  Melatonin 5 MG TABS Take 5 mg by mouth at bedtime.    [provider]  methocarbamol (ROBAXIN) 500 MG tablet Take 1,000 mg by mouth 4 (four) times daily.     [provider]  metoCLOPramide (REGLAN) 10 MG tablet Take 1 tablet (10 mg total) by mouth 4 (four) times daily -  before meals and at bedtime. 12/14/19   Arnetha Courser, MD  metoprolol succinate (TOPROL-XL) 25 MG 24 hr tablet Take 25 mg by mouth daily.    [provider]  nicotine (NICODERM CQ - DOSED IN MG/24 HOURS) 21 mg/24hr patch Place 21 mg onto the skin daily.    [provider]  ondansetron (ZOFRAN) 4 MG tablet Take 4 mg by mouth every 6 (six) hours as needed for nausea or vomiting.    [provider]  oxyCODONE ER 9 MG C12A Take 9 mg by mouth 2 (two) times daily. 10/24/19    Danford, Earl Lites, MD  Oxycodone HCl 10 MG TABS Take 1 tablet (10 mg total) by mouth every 4 (four) hours as needed. Patient taking differently: Take 10 mg by mouth every 4 (four) hours as needed (pain).  10/24/19   Danford, Earl Lites, MD  pantoprazole (PROTONIX) 40 MG injection Inject 40 mg into the vein every 12 (twelve) hours. 12/14/19   Arnetha Courser, MD  polyethylene glycol (MIRALAX / GLYCOLAX) 17 g packet Take 17 g by mouth 2 (two) times daily. 10/24/19   Danford, Earl Lites, MD  pregabalin (LYRICA) 75 MG capsule Take 75 mg by mouth 2 (two) times daily.    [provider]  senna-docusate (SENOKOT-S) 8.6-50 MG tablet Take 1 tablet by mouth 2 (two) times daily. 10/24/19   Danford, Earl Lites, MD  sodium phosphate Pediatric (FLEET) 3.5-9.5 GM/59ML enema Place 1 enema rectally every other day as needed for severe constipation.    [provider]  traZODone (DESYREL) 50 MG tablet Take 50 mg by mouth at bedtime.     [provider]    Allergies Patient has no known allergies.  No family history on file.  Social History Social History   Tobacco Use   Smoking status: Every Day    Packs/day: 0.25    Types: Cigarettes   Smokeless tobacco: Never  Vaping Use   Vaping Use: Never used  Substance Use Topics   Alcohol use: Not Currently   Drug use: Not Currently    Review of Systems  Constitutional: No fever/chills Eyes: No visual changes. ENT: No sore throat. Cardiovascular: Denies chest pain. Respiratory: Denies shortness of breath. Gastrointestinal: Positive for generalized abdominal pain, nausea and vomiting.  No diarrhea.  No constipation. Genitourinary: Negative for dysuria. Musculoskeletal: Negative for back pain. Skin: Negative for rash. Neurological: Negative for headaches, focal weakness or numbness.   ____________________________________________   PHYSICAL EXAM:  VITAL SIGNS: ED Triage Vitals  Enc Vitals Group     BP 04/16/21  2124 (!) 166/58     Pulse Rate 04/16/21 2124 70     Resp 04/16/21 2124 16     Temp 04/16/21 2124 98.6 F (37 C)     Temp Source 04/16/21 2124 Axillary     SpO2 04/16/21 2124 97 %     Weight 04/16/21 2128 240 lb 4.8 oz (109 kg)     Height 04/16/21 2128 5' (1.524 m)     Head Circumference --      Peak Flow --      Pain Score 04/16/21 2127 9     Pain Loc --      Pain Edu? --      Excl. in GC? --     Constitutional: Alert and oriented.  Disheveled appearing and in mild acute distress. Eyes: Conjunctivae are normal. PERRL. EOMI. Head: Atraumatic. Nose: No congestion/rhinnorhea. Mouth/Throat: Mucous membranes are mildly dry. Neck: No stridor.   Cardiovascular: Normal rate, regular rhythm. Grossly normal heart sounds.  Good peripheral circulation. Respiratory: Normal respiratory effort.  No retractions. Lungs CTAB. Gastrointestinal: Obese.  Soft with mild diffuse tenderness to palpation without rebound or guarding. No distention. No abdominal bruits. No CVA tenderness. Musculoskeletal: No lower extremity tenderness nor edema.  No joint effusions. Neurologic:  Normal speech and language. No gross focal neurologic deficits are appreciated.  Skin:  Skin is warm, dry and intact. No rash noted. Psychiatric: Mood and affect are normal. Speech and behavior are normal.  ____________________________________________   LABS (all labs ordered are listed, but only abnormal results are displayed)  Labs Reviewed  URINALYSIS, ROUTINE W REFLEX MICROSCOPIC - Abnormal; Notable for the following components:      Result Value   Color, Urine YELLOW (*)    APPearance CLOUDY (*)    pH 9.0 (*)    Glucose, UA 150 (*)    Ketones, ur 20 (*)    Protein, ur >=300 (*)    Leukocytes,Ua LARGE (*)    Bacteria, UA MANY (*)    All other components within normal limits  CBC WITH DIFFERENTIAL/PLATELET - Abnormal; Notable for the following components:   HCT 38.5 (*)    All other components within normal limits   COMPREHENSIVE METABOLIC PANEL - Abnormal; Notable for the following components:   Glucose, Bld 315 (*)    Calcium 8.7 (*)    ALT 56 (*)    All other components within normal limits  LACTIC ACID, PLASMA - Abnormal; Notable  for the following components:   Lactic Acid, Venous 2.1 (*)    All other components within normal limits  RESP PANEL BY RT-PCR (FLU A&B, COVID) ARPGX2  URINE CULTURE  CULTURE, BLOOD (ROUTINE X 2)  CULTURE, BLOOD (ROUTINE X 2)  LIPASE, BLOOD  PROCALCITONIN  CBC WITH DIFFERENTIAL/PLATELET  LACTIC ACID, PLASMA  HEMOGLOBIN A1C  HIV ANTIBODY (ROUTINE TESTING W REFLEX)  CBG MONITORING, ED   ____________________________________________  EKG  ED ECG REPORT I, Alezandra Egli J, the attending physician, personally viewed and interpreted this ECG.   Date: 04/17/2021  EKG Time: 0220  Rate: 72  Rhythm: normal sinus rhythm  Axis: Normal  Intervals:none  ST&T Change: Nonspecific  ____________________________________________  RADIOLOGY I, Rilen Shukla J, personally viewed and evaluated these images (plain radiographs) as part of my medical decision making, as well as reviewing the written report by the radiologist.  ED MD interpretation: No acute cardiopulmonary process; CT demonstrates esophagitis  Official radiology report(s): CT Abdomen Pelvis Wo Contrast  Result Date: 04/17/2021 CLINICAL DATA:  Abdominal pain and vomiting. EXAM: CT ABDOMEN AND PELVIS WITHOUT CONTRAST TECHNIQUE: Multidetector CT imaging of the abdomen and pelvis was performed following the standard protocol without IV contrast. COMPARISON:  December 02, 2019 FINDINGS: Lower chest: Mild linear atelectasis is seen within the bilateral lung bases. Hepatobiliary: No focal liver abnormality is seen. No gallstones, gallbladder wall thickening, or biliary dilatation. Pancreas: Unremarkable. No pancreatic ductal dilatation or surrounding inflammatory changes. Spleen: Normal in size without focal abnormality.  Adrenals/Urinary Tract: Adrenal glands are unremarkable. Kidneys are normal, without renal calculi, focal lesion, or hydronephrosis. A suprapubic catheter is seen within a contracted urinary bladder. Mild diffuse urinary bladder wall thickening is also noted. Stomach/Bowel: There is mild to moderate severity thickening of the visualized portion of the distal esophagus. Stomach is within normal limits. Appendix appears normal. A moderate amount of stool is seen within the distal large bowel. No evidence of bowel wall thickening, distention, or inflammatory changes. Vascular/Lymphatic: Aortic atherosclerosis. Stable subcentimeter para-aortic, aortocaval and mesenteric lymph nodes are seen. Reproductive: Prostate is unremarkable. Other: There is a 3.8 cm x 3.5 cm fat containing left paraumbilical hernia. No abdominopelvic ascites. Musculoskeletal: A chronic compression fracture deformity is seen at the level of L1 vertebral body. Metallic density operative hardware is seen within the lower thoracic spine. IMPRESSION: 1. Thickening of the distal esophagus which may represent sequelae associated with mild to moderate severity esophagitis. 2. Mild distal fecal impaction. 3. Postoperative changes within lower thoracic spine. Electronically Signed   By: Aram Candela M.D.   On: 04/17/2021 02:34   DG Chest Port 1 View  Result Date: 04/17/2021 CLINICAL DATA:  Abdominal pain and vomiting x1 day. EXAM: PORTABLE CHEST 1 VIEW COMPARISON:  October 21, 2019 FINDINGS: The heart size and mediastinal contours are within normal limits. Both lungs are clear. Stable postoperative changes are seen within the mid and lower thoracic spine. IMPRESSION: Stable exam without acute or active cardiopulmonary disease. Electronically Signed   By: Aram Candela M.D.   On: 04/17/2021 02:35    ____________________________________________   PROCEDURES  Procedure(s) performed (including Critical Care):  .1-3 Lead EKG  Interpretation  Date/Time: 04/17/2021 1:20 AM Performed by: Irean Hong, MD Authorized by: Irean Hong, MD     Interpretation: normal     ECG rate:  70   ECG rate assessment: normal     Rhythm: sinus rhythm     Ectopy: none     Conduction: normal   Comments:  Patient placed on cardiac monitor to evaluate for arrhythmias  CRITICAL CARE Performed by: Irean HongSUNG,Krishauna Schatzman J   Total critical care time: 45 minutes  Critical care time was exclusive of separately billable procedures and treating other patients.  Critical care was necessary to treat or prevent imminent or life-threatening deterioration.  Critical care was time spent personally by me on the following activities: development of treatment plan with patient and/or surrogate as well as nursing, discussions with consultants, evaluation of patient's response to treatment, examination of patient, obtaining history from patient or surrogate, ordering and performing treatments and interventions, ordering and review of laboratory studies, ordering and review of radiographic studies, pulse oximetry and re-evaluation of patient's condition.  ____________________________________________   INITIAL IMPRESSION / ASSESSMENT AND PLAN / ED COURSE  As part of my medical decision making, I reviewed the following data within the electronic MEDICAL RECORD NUMBER Nursing notes reviewed and incorporated, Labs reviewed, EKG interpreted, Old chart reviewed, Radiograph reviewed, and Notes from prior ED visits     60 year old male presenting from SNF with abdominal pain, nausea and vomiting; history of gastroparesis. Differential diagnosis includes, but is not limited to, acute appendicitis, renal colic, testicular torsion, urinary tract infection/pyelonephritis, prostatitis,  epididymitis, diverticulitis, small bowel obstruction or ileus, colitis, abdominal aortic aneurysm, gastroenteritis, hernia, etc.   Will obtain lab work, imaging studies.  Initiate IV  fluid resuscitation, IV morphine for pain, IV Reglan for nausea/vomiting, IV Pepcid.  Will reassess.  Clinical Course as of 04/17/21 0543  Mon Apr 17, 2021  [id number] Elevated lactic acid noted.  Code sepsis initiated.  30 cc/kg IV fluids and broad-spectrum IV antibiotics ordered.  Unremarkable chest x-ray and CT abdomen/pelvis.  Will discuss with hospitalist services for admission. [JS]    Clinical Course User Index [JS] Irean HongSung, Cesia Orf J, MD     ____________________________________________   FINAL CLINICAL IMPRESSION(S) / ED DIAGNOSES  Final diagnoses:  Nausea and vomiting, intractability of vomiting not specified, unspecified vomiting type  Generalized abdominal pain  Gastroparesis  Esophagitis  Sepsis, due to unspecified organism, unspecified whether acute organ dysfunction present [facility](HCC)  Acute cystitis without hematuria     ED Discharge Orders     None        Note:  This document was prepared using Dragon voice recognition software and may include unintentional dictation errors.    Irean HongSung, Lujean Ebright J, MD 04/17/21 [phone]

## 2021-04-17 NOTE — Progress Notes (Deleted)
Suprapubic Cath Change  Patient is present today for a suprapubic catheter change due to urinary retention.  ***ml of water was drained from the balloon, a ***FR foley cath was removed from the tract with out difficulty.  Site was cleaned and prepped in a sterile fashion with betadine.  A ***FR foley cath was replaced into the tract {dnt complications:[id number]}. Urine return was noted, 10 ml of sterile water was inflated into the balloon and a *** bag was attached for drainage.  Patient tolerated well. A night bag was given to patient and proper instruction was given on how to switch bags.    Preformed by: ***  Follow up: ***  

## 2021-04-17 NOTE — Progress Notes (Signed)
PHARMACY -  BRIEF ANTIBIOTIC NOTE   Pharmacy has received consult(s) for Cefepime from an ED provider.  The patient's profile has been reviewed for ht/wt/allergies/indication/available labs.    One time order(s) placed for Cefepime 2 gm IV X 1  Further antibiotics/pharmacy consults should be ordered by admitting physician if indicated.                       Thank you, Brahm Barbeau D 04/17/2021  3:38 AM

## 2021-04-17 NOTE — ED Notes (Signed)
Took over care of patient, here for nausea and vomiting today, has been vomiting and is going to ct now

## 2021-04-17 NOTE — Progress Notes (Signed)
Anticoagulation monitoring(Lovenox):  60 yo male ordered Lovenox 40 mg Q24h    Filed Weights   04/16/21 2128  Weight: 109 kg (240 lb 4.8 oz)   BMI 46.9    Lab Results  Component Value Date   CREATININE 1.15 04/17/2021   CREATININE 0.79 12/12/2019   CREATININE 0.98 12/11/2019   Estimated Creatinine Clearance: 72.9 mL/min (by C-G formula based on SCr of 1.15 mg/dL). Hemoglobin & Hematocrit     Component Value Date/Time   HGB 13.7 04/17/2021 0215   HCT 38.5 (L) 04/17/2021 0215     Per Protocol for Patient with estCrcl > 30 ml/min and BMI > 30, will transition to Lovenox 55 mg Q24h.

## 2021-04-17 NOTE — ED Notes (Signed)
Awaiting a RN to transport patient to his admission bed. Charge aware

## 2021-04-18 ENCOUNTER — Ambulatory Visit: Payer: Medicaid Other | Admitting: Urology

## 2021-04-18 DIAGNOSIS — Z435 Encounter for attention to cystostomy: Secondary | ICD-10-CM

## 2021-04-18 DIAGNOSIS — E1143 Type 2 diabetes mellitus with diabetic autonomic (poly)neuropathy: Secondary | ICD-10-CM | POA: Diagnosis not present

## 2021-04-18 DIAGNOSIS — K3184 Gastroparesis: Secondary | ICD-10-CM | POA: Diagnosis not present

## 2021-04-18 LAB — BASIC METABOLIC PANEL
Anion gap: 12 (ref 5–15)
BUN: 11 mg/dL (ref 6–20)
CO2: 23 mmol/L (ref 22–32)
Calcium: 8.8 mg/dL — ABNORMAL LOW (ref 8.9–10.3)
Chloride: 104 mmol/L (ref 98–111)
Creatinine, Ser: 0.91 mg/dL (ref 0.61–1.24)
GFR, Estimated: >60 mL/min (ref >60)
Glucose, Bld: 226 mg/dL — ABNORMAL HIGH (ref 70–99)
Potassium: 3.4 mmol/L — ABNORMAL LOW (ref 3.5–5.1)
Sodium: 139 mmol/L (ref 135–145)

## 2021-04-18 LAB — CBC
HCT: 41.3 % (ref 39.0–52.0)
Hemoglobin: 14.9 g/dL (ref 13.0–17.0)
MCH: 32.9 pg (ref 26.0–34.0)
MCHC: 36.1 g/dL — ABNORMAL HIGH (ref 30.0–36.0)
MCV: 91.2 fL (ref 80.0–100.0)
Platelets: 184 10*3/uL (ref 150–400)
RBC: 4.53 MIL/uL (ref 4.22–5.81)
RDW: 11.8 % (ref 11.5–15.5)
WBC: 9.7 10*3/uL (ref 4.0–10.5)
nRBC: 0 % (ref 0.0–0.2)

## 2021-04-18 LAB — GLUCOSE, CAPILLARY
Glucose-Capillary: 191 mg/dL — ABNORMAL HIGH (ref 70–99)
Glucose-Capillary: 212 mg/dL — ABNORMAL HIGH (ref 70–99)
Glucose-Capillary: 245 mg/dL — ABNORMAL HIGH (ref 70–99)
Glucose-Capillary: 250 mg/dL — ABNORMAL HIGH (ref 70–99)
Glucose-Capillary: 250 mg/dL — ABNORMAL HIGH (ref 70–99)

## 2021-04-18 LAB — MAGNESIUM: Magnesium: 2 mg/dL (ref 1.7–2.4)

## 2021-04-18 MED ORDER — MORPHINE SULFATE (PF) 2 MG/ML IV SOLN
1.0000 mg | Freq: Four times a day (QID) | INTRAVENOUS | Status: DC | PRN
  Administered 2021-04-18: 1 mg via INTRAVENOUS
  Filled 2021-04-18: qty 1

## 2021-04-18 MED ORDER — FAMOTIDINE 20 MG PO TABS: 20.0000 mg | ORAL_TABLET | Freq: Every day | ORAL | Status: DC

## 2021-04-18 MED ORDER — METHOCARBAMOL 1000 MG/10ML IJ SOLN
1000.0000 mg | Freq: Three times a day (TID) | INTRAVENOUS | Status: DC
  Administered 2021-04-18: 1000 mg via INTRAVENOUS
  Filled 2021-04-18 (×2): qty 10

## 2021-04-18 MED ORDER — POTASSIUM CHLORIDE IN NACL 20-0.9 MEQ/L-% IV SOLN
INTRAVENOUS | Status: DC
  Filled 2021-04-18 (×2): qty 1000

## 2021-04-18 MED ORDER — HYDRALAZINE HCL 20 MG/ML IJ SOLN: 10.0000 mg | Freq: Four times a day (QID) | INTRAMUSCULAR | Status: DC | PRN

## 2021-04-18 MED ORDER — MORPHINE SULFATE (PF) 4 MG/ML IV SOLN
3.0000 mg | Freq: Once | INTRAVENOUS | Status: AC
  Administered 2021-04-18: 3 mg via INTRAVENOUS
  Filled 2021-04-18: qty 1

## 2021-04-18 MED ORDER — METOCLOPRAMIDE HCL 10 MG PO TABS: 10.0000 mg | ORAL_TABLET | Freq: Three times a day (TID) | ORAL | Status: DC

## 2021-04-18 MED ORDER — HYDRALAZINE HCL 50 MG PO TABS
50.0000 mg | ORAL_TABLET | Freq: Three times a day (TID) | ORAL | Status: DC
  Administered 2021-04-21 – 2021-04-25 (×11): 50 mg via ORAL
  Filled 2021-04-18 (×17): qty 1

## 2021-04-18 MED ORDER — METOCLOPRAMIDE HCL 5 MG/ML IJ SOLN
10.0000 mg | Freq: Four times a day (QID) | INTRAMUSCULAR | Status: AC
  Administered 2021-04-18 – 2021-04-19 (×4): 10 mg via INTRAVENOUS
  Filled 2021-04-18 (×4): qty 2

## 2021-04-18 MED ORDER — MORPHINE SULFATE (PF) 4 MG/ML IV SOLN
4.0000 mg | Freq: Four times a day (QID) | INTRAVENOUS | Status: DC | PRN
  Administered 2021-04-19 – 2021-04-20 (×4): 4 mg via INTRAVENOUS
  Filled 2021-04-18 (×5): qty 1

## 2021-04-18 MED ORDER — FAMOTIDINE IN NACL 20-0.9 MG/50ML-% IV SOLN
20.0000 mg | Freq: Two times a day (BID) | INTRAVENOUS | Status: DC
  Administered 2021-04-18 – 2021-04-21 (×5): 20 mg via INTRAVENOUS
  Filled 2021-04-18 (×8): qty 50

## 2021-04-18 MED ORDER — METHOCARBAMOL 500 MG PO TABS: 1000.0000 mg | ORAL_TABLET | Freq: Four times a day (QID) | ORAL | Status: DC

## 2021-04-18 NOTE — Progress Notes (Signed)
Patient is declining care from the staff. This nurse approached patient to request to obtain vital signs as they are beneficial for his plan of care. Pt declines.

## 2021-04-18 NOTE — Progress Notes (Signed)
Inpatient Diabetes Program Recommendations  AACE/ADA: New Consensus Statement on Inpatient Glycemic Control (2015)  Target Ranges:  Prepandial:   less than 140 mg/dL      Peak postprandial:   less than 180 mg/dL (1-2 hours)      Critically ill patients:  140 - 180 mg/dL   Lab Results  Component Value Date   GLUCAP 191 (H) 04/18/2021   HGBA1C 7.9 (H) 04/17/2021    Review of Glycemic Control Results for JERRIK, HOUSHOLDER (MRN [id number]) as of 04/18/2021 11:08  Ref. Range 04/17/2021 10:53 04/17/2021 13:23 04/17/2021 16:24 04/17/2021 20:34 04/17/2021 22:58 04/18/2021 04:38 04/18/2021 07:51  Glucose-Capillary Latest Ref Range: 70 - 99 mg/dL 373 (H) 428 (H) 768 (H) 232 (H) 222 (H) 212 (H) 191 (H)   Diabetes history: DM 2 Outpatient Diabetes medications: Lantus 50 units bid, humalog 0-15 units 4x/day, Semaglutide 1 mg QSunday Current orders for Inpatient glycemic control:  Novolog 0-20 units Q4 hours  Inpatient Diabetes Program Recommendations:    - Start insulin Glargine 15 units Q24 hours.  Tanks,  Christena Deem RN, MSN, BC-ADM Inpatient Diabetes Coordinator Team Pager [phone] (8a-5p)

## 2021-04-18 NOTE — Progress Notes (Signed)
This nurse has made several attempts to perform shift assessment and obtain vital signs from the patient. Pts linens need to be changed and he need hygenic care. Pt declines all methods of care. No acute distress is noted.

## 2021-04-18 NOTE — Progress Notes (Signed)
Notified by Nurse Isidoro Donning that the patient stated he wanted to be left alone & notified tele.

## 2021-04-18 NOTE — Progress Notes (Signed)
Patient declined care through out the day, but let the nurse tech take vitals, blood pressure not high enough to give PRN medication, patient unable to take PO meds due to vomiting, MD aware of 3.4 potassium, & IV morphine ordered for pain.

## 2021-04-18 NOTE — Progress Notes (Signed)
PROGRESS NOTE    Eugene [name]  WUJ:[id number] DOB: 10/22/1960 DOA: 04/17/2021 PCP: Abner Greenspan, MD  226A/[id number]-AA   Assessment & Plan:   Principal Problem:   Diabetic gastroparesis [facility]) Active Problems:   UTI (urinary tract infection)   Type 2 diabetes mellitus with hyperglycemia, with long-term current use of insulin (HCC)   HTN (hypertension)   Chronic hepatitis C without hepatic coma (HCC)   Suprapubic catheter (HCC)   Eugene [name] is a 60 y.o. male with medical history significant for Type 2 diabetes with gastroparesis, IBS, hepatitis C, anxiety and depression, chronic suprapubic catheter, recurrent fecal impaction who was sent to the ED for evaluation of intractable nausea and vomiting that started earlier in the day.  His symptoms feel similar to prior episodes of gastroparesis.  He has diffuse abdominal pain.   Intractable vomiting secondary to diabetic gastroparesis (HCC) --was able to take some full liquid diet yesterday, but more N/V today Plan: --cont IV Reglan --IV zofran PRN - GI consult for additional recommendations if not improving --cont MIVF@50   Abdominal pain --pt appeared very uncomfortable, and was unable to take oral meds --IV morphine 4 mg q6h PRN --monitor for constipation     Type 2 diabetes mellitus with hyperglycemia, with long-term current use of insulin (HCC) --SSI q4h for now     UTI (urinary tract infection), ruled out   chronic Suprapubic catheter (HCC) - started on cefe on presentation --with chronic suprapubic cath, 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  No signs of infection currently. --d/c abx     HTN (hypertension) --on home hydralazine --IV hydralazine PRN if not able to take orals   DVT prophylaxis: Lovenox SQ Code Status: Full code  Family Communication:  Level of care: Med-Surg Dispo:   The patient is from: SNF Anticipated d/c is to: SNF Anticipated d/c date is: 2-3 days Patient currently is not medically  ready to d/c due to: severe N/V, not able to have oral intake   Subjective and Interval History:  N/V worse today, pt not able to tolerate oral intake.  Also complained of abdominal pain from vomiting, requesting IV morphine.  Pt refused SSI today.   Objective: Vitals:   04/18/21 0436 04/18/21 0752 04/18/21 1630 04/19/21 0603  BP: (!) 158/81 (!) 146/66 (!) 174/88 (!) 181/82  Pulse: 69 71 79 61  Resp: 16 18 20 18   Temp: 98.4 F (36.9 C)  97.8 F (36.6 C) 99.1 F (37.3 C)  TempSrc: Oral  Oral Oral  SpO2: 98% 98% 95% 93%  Weight:      Height:        Intake/Output Summary (Last 24 hours) at 04/19/2021 0700 Last data filed at 04/19/2021 0600 Gross per 24 hour  Intake 1102.23 ml  Output 2000 ml  Net -897.77 ml   Filed Weights   04/16/21 2128  Weight: 109 kg    Examination:   Constitutional: NAD, AAOx3 HEENT: conjunctivae and lids normal, EOMI CV: No cyanosis.   RESP: normal respiratory effort, on RA GI:  abdomen soft, no BS Extremities: bilateral BKA SKIN: warm, dry   Data Reviewed: I have personally reviewed following labs and imaging studies  CBC: Recent Labs  Lab 04/17/21 0215 04/18/21 1008  WBC 8.7 9.7  NEUTROABS 7.6  --   HGB 13.7 14.9  HCT 38.5* 41.3  MCV 91.2 91.2  PLT 201 184   Basic Metabolic Panel: Recent Labs  Lab 04/17/21 0215 04/18/21 1008  NA 135 139  K 3.8 3.4*  CL 102 104  CO2 22 23  GLUCOSE 315* 226*  BUN 16 11  CREATININE 1.15 0.91  CALCIUM 8.7* 8.8*  MG  --  2.0   GFR: Estimated Creatinine Clearance: 92.1 mL/min (by C-G formula based on SCr of 0.91 mg/dL). Liver Function Tests: Recent Labs  Lab 04/17/21 0215  AST 30  ALT 56*  ALKPHOS 78  BILITOT 1.0  PROT 8.0  ALBUMIN 3.6   Recent Labs  Lab 04/17/21 0215  LIPASE 19   No results for input(s): AMMONIA in the last 168 hours. Coagulation Profile: No results for input(s): INR, PROTIME in the last 168 hours. Cardiac Enzymes: No results for input(s): CKTOTAL, CKMB,  CKMBINDEX, TROPONINI in the last 168 hours. BNP (last 3 results) No results for input(s): PROBNP in the last 8760 hours. HbA1C: Recent Labs    04/17/21 0729  HGBA1C 7.9*   CBG: Recent Labs  Lab 04/18/21 0751 04/18/21 1630 04/18/21 2029 04/18/21 2335 04/19/21 0600  GLUCAP 191* 250* 250* 245* 229*   Lipid Profile: No results for input(s): CHOL, HDL, LDLCALC, TRIG, CHOLHDL, LDLDIRECT in the last 72 hours. Thyroid Function Tests: No results for input(s): TSH, T4TOTAL, FREET4, T3FREE, THYROIDAB in the last 72 hours. Anemia Panel: No results for input(s): VITAMINB12, FOLATE, FERRITIN, TIBC, IRON, RETICCTPCT in the last 72 hours. Sepsis Labs: Recent Labs  Lab 04/17/21 0215 04/17/21 0729  PROCALCITON <0.10  --   LATICACIDVEN 2.1* 1.5    Recent Results (from the past 240 hour(s))  Urine Culture     Status: Abnormal (Preliminary result)   Collection Time: 04/16/21  9:28 PM   Specimen: Urine, Random  Result Value Ref Range Status   Specimen Description   Final    URINE, RANDOM Performed at [facility], [address], [place], [place] [address]    Special Requests   Final    NONE Performed at [facility], [address], [place], [place] [address]    Culture (A)  Final    80,000 COLONIES/mL PROVIDENCIA RETTGERI SUSCEPTIBILITIES TO FOLLOW Performed at [facility], [address], [place], [place] [address]    Report Status PENDING  Incomplete  Resp Panel by RT-PCR (Flu A&B, Covid) Nasopharyngeal Swab     Status: None   Collection Time: 04/17/21  2:15 AM   Specimen: Nasopharyngeal Swab; Nasopharyngeal(NP) swabs in vial transport medium  Result Value Ref Range Status   SARS Coronavirus 2 by RT PCR NEGATIVE NEGATIVE Final    Comment: (NOTE) SARS-CoV-2 target nucleic acids are NOT DETECTED.  The SARS-CoV-2 RNA is generally detectable in upper respiratory specimens during the acute phase of infection. The lowest concentration of SARS-CoV-2  viral copies this assay can detect is 138 copies/mL. A negative result does not preclude SARS-Cov-2 infection and should not be used as the sole basis for treatment or other patient management decisions. A negative result may occur with  improper specimen collection/handling, submission of specimen other than nasopharyngeal swab, presence of viral mutation(s) within the areas targeted by this assay, and inadequate number of viral copies(<138 copies/mL). A negative result must be combined with clinical observations, patient history, and epidemiological information. The expected result is Negative.  Fact Sheet for Patients:  BloggerCourse.com  Fact Sheet for Healthcare Providers:  SeriousBroker.it  This test is no t yet approved or cleared by the [place] FDA and  has been authorized for detection and/or diagnosis of SARS-CoV-2 by FDA under an Emergency Use Authorization (EUA). This EUA will remain  in effect (meaning this test can be used) for the duration of the COVID-19 declaration under Section 564(b)(1) of the Act, 21 U.S.C.section 360bbb-3(b)(1), unless the authorization is terminated  or revoked sooner.       Influenza A by PCR NEGATIVE NEGATIVE Final   Influenza B by PCR NEGATIVE NEGATIVE Final    Comment: (NOTE) The Xpert Xpress SARS-CoV-2/FLU/RSV plus assay is intended as an aid in the diagnosis of influenza from Nasopharyngeal swab specimens and should not be used as a sole basis for treatment. Nasal washings and aspirates are unacceptable for Xpert Xpress SARS-CoV-2/FLU/RSV testing.  Fact Sheet for Patients: BloggerCourse.com  Fact Sheet for Healthcare Providers: SeriousBroker.it  This test is not yet approved or cleared by the [place] FDA and has been authorized for detection and/or diagnosis of SARS-CoV-2 by FDA under an Emergency Use Authorization  (EUA). This EUA will remain in effect (meaning this test can be used) for the duration of the COVID-19 declaration under Section 564(b)(1) of the Act, 21 U.S.C. section 360bbb-3(b)(1), unless the authorization is terminated or revoked.  Performed at [facility], [address] [place], [place] [address]   Culture, blood (routine x 2)     Status: None (Preliminary result)   Collection Time: 04/17/21  7:29 AM   Specimen: BLOOD RIGHT HAND  Result Value Ref Range Status   Specimen Description BLOOD RIGHT HAND  Final   Special Requests   Final    BOTTLES DRAWN AEROBIC ONLY Blood Culture results may not be optimal due to an inadequate volume of blood received in culture bottles   Culture   Final    NO GROWTH < 24 HOURS Performed at [facility], [address] [place], [place] [address]    Report Status PENDING  Incomplete  Culture, blood (routine x 2)     Status: None (Preliminary result)   Collection Time: 04/17/21  7:31 AM   Specimen: BLOOD  Result Value Ref Range Status   Specimen Description BLOOD BLOOD LEFT HAND  Final   Special Requests   Final    BOTTLES DRAWN AEROBIC ONLY Blood Culture adequate volume   Culture   Final    NO GROWTH < 24 HOURS Performed at [facility], [address] [place], [place] [address]    Report Status PENDING  Incomplete      Radiology Studies: No results found.   Scheduled Meds:  enoxaparin (LOVENOX) injection  0.5 mg/kg Subcutaneous Q24H   hydrALAZINE  50 mg Oral TID   insulin aspart  0-20 Units Subcutaneous Q4H   Continuous Infusions:  0.9 % NaCl with KCl 20 mEq / L 50 mL/hr at 04/18/21 1706   famotidine (PEPCID) IV 20 mg (04/18/21 1821)   methocarbamol (ROBAXIN) IV Stopped (04/18/21 2323)   promethazine (PHENERGAN) injection (IM or IVPB) 12.5 mg (04/18/21 2335)     LOS: 2 days     Darlin Priestly, MD [facility] If 7PM-7AM, please contact night-coverage 04/19/2021, 7:00 AM

## 2021-04-19 DIAGNOSIS — K3184 Gastroparesis: Secondary | ICD-10-CM | POA: Diagnosis not present

## 2021-04-19 DIAGNOSIS — E1143 Type 2 diabetes mellitus with diabetic autonomic (poly)neuropathy: Secondary | ICD-10-CM | POA: Diagnosis not present

## 2021-04-19 LAB — GLUCOSE, CAPILLARY
Glucose-Capillary: 189 mg/dL — ABNORMAL HIGH (ref 70–99)
Glucose-Capillary: 199 mg/dL — ABNORMAL HIGH (ref 70–99)
Glucose-Capillary: 204 mg/dL — ABNORMAL HIGH (ref 70–99)
Glucose-Capillary: 229 mg/dL — ABNORMAL HIGH (ref 70–99)
Glucose-Capillary: 232 mg/dL — ABNORMAL HIGH (ref 70–99)

## 2021-04-19 LAB — CBC
HCT: 40.8 % (ref 39.0–52.0)
Hemoglobin: 14.2 g/dL (ref 13.0–17.0)
MCH: 31.8 pg (ref 26.0–34.0)
MCHC: 34.8 g/dL (ref 30.0–36.0)
MCV: 91.5 fL (ref 80.0–100.0)
Platelets: 180 10*3/uL (ref 150–400)
RBC: 4.46 MIL/uL (ref 4.22–5.81)
RDW: 11.8 % (ref 11.5–15.5)
WBC: 10.3 10*3/uL (ref 4.0–10.5)
nRBC: 0 % (ref 0.0–0.2)

## 2021-04-19 LAB — BASIC METABOLIC PANEL
Anion gap: 10 (ref 5–15)
BUN: 12 mg/dL (ref 6–20)
CO2: 25 mmol/L (ref 22–32)
Calcium: 8.8 mg/dL — ABNORMAL LOW (ref 8.9–10.3)
Chloride: 104 mmol/L (ref 98–111)
Creatinine, Ser: 0.79 mg/dL (ref 0.61–1.24)
GFR, Estimated: >60 mL/min (ref >60)
Glucose, Bld: 222 mg/dL — ABNORMAL HIGH (ref 70–99)
Potassium: 3.3 mmol/L — ABNORMAL LOW (ref 3.5–5.1)
Sodium: 139 mmol/L (ref 135–145)

## 2021-04-19 LAB — URINE CULTURE: Culture: 80000 — AB

## 2021-04-19 LAB — MAGNESIUM: Magnesium: 2.1 mg/dL (ref 1.7–2.4)

## 2021-04-19 MED ORDER — INSULIN GLARGINE-YFGN 100 UNIT/ML ~~LOC~~ SOLN
10.0000 [IU] | Freq: Every day | SUBCUTANEOUS | Status: DC
  Administered 2021-04-19: 10 [IU] via SUBCUTANEOUS
  Filled 2021-04-19 (×3): qty 0.1

## 2021-04-19 MED ORDER — METOCLOPRAMIDE HCL 5 MG/ML IJ SOLN
10.0000 mg | Freq: Four times a day (QID) | INTRAMUSCULAR | Status: DC
  Administered 2021-04-19 – 2021-04-25 (×23): 10 mg via INTRAVENOUS
  Filled 2021-04-19 (×23): qty 2

## 2021-04-19 MED ORDER — POTASSIUM CHLORIDE 10 MEQ/100ML IV SOLN
10.0000 meq | INTRAVENOUS | Status: AC
  Administered 2021-04-19 (×2): 10 meq via INTRAVENOUS
  Filled 2021-04-19 (×2): qty 100

## 2021-04-19 MED ORDER — POTASSIUM CHLORIDE 10 MEQ/100ML IV SOLN
10.0000 meq | Freq: Once | INTRAVENOUS | Status: AC
  Administered 2021-04-19: 10 meq via INTRAVENOUS
  Filled 2021-04-19: qty 100

## 2021-04-19 NOTE — Progress Notes (Signed)
PROGRESS NOTE    Eugene [name]  YJE:[id number] DOB: 11-Oct-1960 DOA: 04/17/2021 PCP: Abner Greenspan, MD  [id number]   Assessment & Plan:   Principal Problem:   Diabetic gastroparesis [facility]) Active Problems:   UTI (urinary tract infection)   Type 2 diabetes mellitus with hyperglycemia, with long-term current use of insulin (HCC)   HTN (hypertension)   Chronic hepatitis C without hepatic coma (HCC)   Suprapubic catheter (HCC)   Eugene [name] is a 60 y.o. male with medical history significant for Type 2 diabetes with gastroparesis, IBS, hepatitis C, anxiety and depression, chronic suprapubic catheter, recurrent fecal impaction who was sent to the ED for evaluation of intractable nausea and vomiting that started earlier in the day.  His symptoms feel similar to prior episodes of gastroparesis.  He has diffuse abdominal pain.   Intractable vomiting secondary to diabetic gastroparesis (HCC) --so far, inconsistent improvement in N/V and oral intake Plan: --cont IV Reglan QID --IV zofran PRN --cont MIVF@50  --back on clear liquid diet --consider GI consult if not improving   Abdominal pain --IV morphine 4 mg q6h PRN while unable to take oral --Hold IV morphine if pt refuses vital checks or other necessary care --monitor for constipation  Medication non-compliance --pt intermittently refusing vital checks, scheduled meds and SSI. --Pt advised that IV morphine would be held if he refuses vitals checks and other necessary care     Type 2 diabetes mellitus with hyperglycemia, with long-term current use of insulin (HCC) --SSI q4h for now --start Lantus 10u daily     UTI (urinary tract infection), ruled out   chronic Suprapubic catheter (HCC) - started on cefe on presentation, since d/c'ed. 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  No signs of infection currently. --Hold abx     HTN (hypertension) --on home hydralazine --IV hydralazine PRN if not  able to take orals   DVT prophylaxis: Lovenox SQ Code Status: Full code  Family Communication:  Level of care: Med-Surg Dispo:   The patient is from: SNF Anticipated d/c is to: SNF Anticipated d/c date is: 2-3 days Patient currently is not medically ready to d/c due to: N/V, not able to have sufficient oral intake   Subjective and Interval History:  RN noted pt again refusing certain meds, SSI and vital checks.    Per RN, still having N/V and poor oral intake.   Objective: Vitals:   04/18/21 0752 04/18/21 1630 04/19/21 0603 04/19/21 1457  BP: (!) 146/66 (!) 174/88 (!) 181/82 (!) 164/83  Pulse: 71 79 61 72  Resp: 18 20 18 18   Temp:  97.8 F (36.6 C) 99.1 F (37.3 C) 98 F (36.7 C)  TempSrc:  Oral Oral Oral  SpO2: 98% 95% 93% 96%  Weight:      Height:        Intake/Output Summary (Last 24 hours) at 04/19/2021 1634 Last data filed at 04/19/2021 1628 Gross per 24 hour  Intake 1550.04 ml  Output 700 ml  Net 850.04 ml   Filed Weights   04/16/21 2128  Weight: 109 kg    Examination:   Constitutional: NAD, AAOx3, appeared comfortable during rounding HEENT: conjunctivae and lids normal, EOMI CV: No cyanosis.   RESP: normal respiratory effort, on RA Extremities: bilateral BKA SKIN: warm, dry Neuro: II - XII grossly intact.     Data Reviewed: I have personally reviewed following labs and imaging studies  CBC: Recent Labs  Lab 04/17/21 0215 04/18/21 1008 04/19/21 0739  WBC 8.7 9.7 10.3  NEUTROABS 7.6  --   --   HGB 13.7 14.9 14.2  HCT 38.5* 41.3 40.8  MCV 91.2 91.2 91.5  PLT 201 184 180   Basic Metabolic Panel: Recent Labs  Lab 04/17/21 0215 04/18/21 1008 04/19/21 0739  NA 135 139 139  K 3.8 3.4* 3.3*  CL 102 104 104  CO2 22 23 25   GLUCOSE 315* 226* 222*  BUN 16 11 12   CREATININE 1.15 0.91 0.79  CALCIUM 8.7* 8.8* 8.8*  MG  --  2.0 2.1   GFR: Estimated Creatinine Clearance: 104.8 mL/min (by C-G formula based on SCr of 0.79 mg/dL). Liver  Function Tests: Recent Labs  Lab 04/17/21 0215  AST 30  ALT 56*  ALKPHOS 78  BILITOT 1.0  PROT 8.0  ALBUMIN 3.6   Recent Labs  Lab 04/17/21 0215  LIPASE 19   No results for input(s): AMMONIA in the last 168 hours. Coagulation Profile: No results for input(s): INR, PROTIME in the last 168 hours. Cardiac Enzymes: No results for input(s): CKTOTAL, CKMB, CKMBINDEX, TROPONINI in the last 168 hours. BNP (last 3 results) No results for input(s): PROBNP in the last 8760 hours. HbA1C: Recent Labs    04/17/21 0729  HGBA1C 7.9*   CBG: Recent Labs  Lab 04/18/21 1630 04/18/21 2029 04/18/21 2335 04/19/21 0600 04/19/21 1147  GLUCAP 250* 250* 245* 229* 204*   Lipid Profile: No results for input(s): CHOL, HDL, LDLCALC, TRIG, CHOLHDL, LDLDIRECT in the last 72 hours. Thyroid Function Tests: No results for input(s): TSH, T4TOTAL, FREET4, T3FREE, THYROIDAB in the last 72 hours. Anemia Panel: No results for input(s): VITAMINB12, FOLATE, FERRITIN, TIBC, IRON, RETICCTPCT in the last 72 hours. Sepsis Labs: Recent Labs  Lab 04/17/21 0215 04/17/21 0729  PROCALCITON <0.10  --   LATICACIDVEN 2.1* 1.5    Recent Results (from the past 240 hour(s))  Urine Culture     Status: Abnormal   Collection Time: 04/16/21  9:28 PM   Specimen: Urine, Random  Result Value Ref Range Status   Specimen Description   Final    URINE, RANDOM Performed at [facility], [address] [place], [address] [place]    Special Requests   Final    NONE Performed at [facility], [address] [place], [address] [place]    Culture 80,000 COLONIES/mL PROVIDENCIA RETTGERI (A)  Final   Report Status 04/19/2021 FINAL  Final   Organism ID, Bacteria PROVIDENCIA RETTGERI (A)  Final      Susceptibility   Providencia rettgeri - MIC*    AMPICILLIN RESISTANT Resistant     CEFAZOLIN RESISTANT Resistant     CEFEPIME <=0.12 SENSITIVE Sensitive     CEFTRIAXONE <=0.25 SENSITIVE Sensitive      CIPROFLOXACIN <=0.25 SENSITIVE Sensitive     GENTAMICIN <=1 SENSITIVE Sensitive     IMIPENEM 4 SENSITIVE Sensitive     NITROFURANTOIN 128 RESISTANT Resistant     TRIMETH/SULFA <=20 SENSITIVE Sensitive     AMPICILLIN/SULBACTAM <=2 SENSITIVE Sensitive     PIP/TAZO <=4 SENSITIVE Sensitive     * 80,000 COLONIES/mL PROVIDENCIA RETTGERI  Resp Panel by RT-PCR (Flu A&B, Covid) Nasopharyngeal Swab     Status: None   Collection Time: 04/17/21  2:15 AM   Specimen: Nasopharyngeal Swab; Nasopharyngeal(NP) swabs in vial transport medium  Result Value Ref Range Status   SARS Coronavirus 2 by RT PCR NEGATIVE NEGATIVE Final    Comment: (NOTE) SARS-CoV-2 target nucleic acids are NOT DETECTED.  The SARS-CoV-2 RNA is generally detectable in upper respiratory specimens during the acute phase of infection. The lowest concentration of SARS-CoV-2 viral copies this assay can detect is 138 copies/mL. A negative result does not preclude SARS-Cov-2 infection and should not be used as the sole basis for treatment or other patient management decisions. A negative result may occur with  improper specimen collection/handling, submission of specimen other than nasopharyngeal swab, presence of viral mutation(s) within the areas targeted by this assay, and inadequate number of viral copies(<138 copies/mL). A negative result must be combined with clinical observations, patient history, and epidemiological information. The expected result is Negative.  Fact Sheet for Patients:  BloggerCourse.com  Fact Sheet for Healthcare Providers:  SeriousBroker.it  This test is no t yet approved or cleared by the [place] FDA and  has been authorized for detection and/or diagnosis of SARS-CoV-2 by FDA under an Emergency Use Authorization (EUA). This EUA will remain  in effect (meaning this test can be used) for the duration of the COVID-19 declaration under Section 564(b)(1)  of the Act, 21 U.S.C.section 360bbb-3(b)(1), unless the authorization is terminated  or revoked sooner.       Influenza A by PCR NEGATIVE NEGATIVE Final   Influenza B by PCR NEGATIVE NEGATIVE Final    Comment: (NOTE) The Xpert Xpress SARS-CoV-2/FLU/RSV plus assay is intended as an aid in the diagnosis of influenza from Nasopharyngeal swab specimens and should not be used as a sole basis for treatment. Nasal washings and aspirates are unacceptable for Xpert Xpress SARS-CoV-2/FLU/RSV testing.  Fact Sheet for Patients: BloggerCourse.com  Fact Sheet for Healthcare Providers: SeriousBroker.it  This test is not yet approved or cleared by the [place] FDA and has been authorized for detection and/or diagnosis of SARS-CoV-2 by FDA under an Emergency Use Authorization (EUA). This EUA will remain in effect (meaning this test can be used) for the duration of the COVID-19 declaration under Section 564(b)(1) of the Act, 21 U.S.C. section 360bbb-3(b)(1), unless the authorization is terminated or revoked.  Performed at [facility], [address] [place], [place] [address]   Culture, blood (routine x 2)     Status: None (Preliminary result)   Collection Time: 04/17/21  7:29 AM   Specimen: BLOOD RIGHT HAND  Result Value Ref Range Status   Specimen Description BLOOD RIGHT HAND  Final   Special Requests   Final    BOTTLES DRAWN AEROBIC ONLY Blood Culture results may not be optimal due to an inadequate volume of blood received in culture bottles   Culture   Final    NO GROWTH 2 DAYS Performed at [facility], [address] [place], [place] [address]    Report Status PENDING  Incomplete  Culture, blood (routine x 2)     Status: None (Preliminary result)   Collection Time: 04/17/21  7:31 AM   Specimen: BLOOD  Result Value Ref Range Status   Specimen Description BLOOD BLOOD LEFT HAND  Final   Special Requests    Final    BOTTLES DRAWN AEROBIC ONLY Blood Culture adequate volume   Culture   Final    NO GROWTH 2 DAYS Performed at [facility], [address] [place], [place] [address]    Report Status PENDING  Incomplete      Radiology Studies: No results found.   Scheduled Meds:  enoxaparin (LOVENOX) injection  0.5 mg/kg Subcutaneous Q24H   hydrALAZINE  50 mg Oral TID   insulin aspart  0-20 Units  Subcutaneous Q4H   insulin glargine-yfgn  10 Units Subcutaneous Daily   metoCLOPramide (REGLAN) injection  10 mg Intravenous QID   Continuous Infusions:  0.9 % NaCl with KCl 20 mEq / L 50 mL/hr at 04/18/21 1706   famotidine (PEPCID) IV 20 mg (04/18/21 1821)   potassium chloride     promethazine (PHENERGAN) injection (IM or IVPB) 12.5 mg (04/19/21 1229)     LOS: 2 days     Darlin Priestlyina Nicholes Hibler, MD [facility] If 7PM-7AM, please contact night-coverage 04/19/2021, 4:34 PM

## 2021-04-19 NOTE — Progress Notes (Signed)
Patient declined glucose check and vitals

## 2021-04-20 DIAGNOSIS — E1143 Type 2 diabetes mellitus with diabetic autonomic (poly)neuropathy: Principal | ICD-10-CM

## 2021-04-20 DIAGNOSIS — K3184 Gastroparesis: Secondary | ICD-10-CM

## 2021-04-20 LAB — MAGNESIUM: Magnesium: 2 mg/dL (ref 1.7–2.4)

## 2021-04-20 LAB — CBC
HCT: 38.3 % — ABNORMAL LOW (ref 39.0–52.0)
Hemoglobin: 13.3 g/dL (ref 13.0–17.0)
MCH: 31.6 pg (ref 26.0–34.0)
MCHC: 34.7 g/dL (ref 30.0–36.0)
MCV: 91 fL (ref 80.0–100.0)
Platelets: 164 10*3/uL (ref 150–400)
RBC: 4.21 MIL/uL — ABNORMAL LOW (ref 4.22–5.81)
RDW: 11.8 % (ref 11.5–15.5)
WBC: 8.5 10*3/uL (ref 4.0–10.5)
nRBC: 0 % (ref 0.0–0.2)

## 2021-04-20 LAB — BASIC METABOLIC PANEL
Anion gap: 6 (ref 5–15)
BUN: 11 mg/dL (ref 6–20)
CO2: 26 mmol/L (ref 22–32)
Calcium: 8.4 mg/dL — ABNORMAL LOW (ref 8.9–10.3)
Chloride: 108 mmol/L (ref 98–111)
Creatinine, Ser: 0.76 mg/dL (ref 0.61–1.24)
GFR, Estimated: >60 mL/min (ref >60)
Glucose, Bld: 202 mg/dL — ABNORMAL HIGH (ref 70–99)
Potassium: 3.3 mmol/L — ABNORMAL LOW (ref 3.5–5.1)
Sodium: 140 mmol/L (ref 135–145)

## 2021-04-20 LAB — GLUCOSE, CAPILLARY
Glucose-Capillary: 155 mg/dL — ABNORMAL HIGH (ref 70–99)
Glucose-Capillary: 185 mg/dL — ABNORMAL HIGH (ref 70–99)
Glucose-Capillary: 190 mg/dL — ABNORMAL HIGH (ref 70–99)
Glucose-Capillary: 191 mg/dL — ABNORMAL HIGH (ref 70–99)
Glucose-Capillary: 207 mg/dL — ABNORMAL HIGH (ref 70–99)
Glucose-Capillary: 226 mg/dL — ABNORMAL HIGH (ref 70–99)

## 2021-04-20 MED ORDER — MELATONIN 5 MG PO TABS
5.0000 mg | ORAL_TABLET | Freq: Every day | ORAL | Status: DC
  Administered 2021-04-21 – 2021-04-24 (×4): 5 mg via ORAL
  Filled 2021-04-20 (×4): qty 1

## 2021-04-20 MED ORDER — LINACLOTIDE 145 MCG PO CAPS
145.0000 ug | ORAL_CAPSULE | Freq: Every day | ORAL | Status: DC
  Administered 2021-04-21 – 2021-04-25 (×5): 145 ug via ORAL
  Filled 2021-04-20 (×5): qty 1

## 2021-04-20 MED ORDER — POTASSIUM CHLORIDE 10 MEQ/100ML IV SOLN
10.0000 meq | INTRAVENOUS | Status: AC
Start: 2021-04-20 — End: 2021-04-20
  Administered 2021-04-20 (×4): 10 meq via INTRAVENOUS
  Filled 2021-04-20 (×3): qty 100

## 2021-04-20 MED ORDER — ACETAMINOPHEN 325 MG PO TABS: 650.0000 mg | ORAL_TABLET | ORAL | Status: DC | PRN

## 2021-04-20 MED ORDER — SODIUM CHLORIDE 0.9 % IV SOLN: INTRAVENOUS | Status: DC

## 2021-04-20 MED ORDER — METOPROLOL SUCCINATE ER 25 MG PO TB24
25.0000 mg | ORAL_TABLET | Freq: Every day | ORAL | Status: DC
  Administered 2021-04-21 – 2021-04-25 (×4): 25 mg via ORAL
  Filled 2021-04-20 (×6): qty 1

## 2021-04-20 MED ORDER — POLYETHYLENE GLYCOL 3350 17 G PO PACK
17.0000 g | PACK | Freq: Two times a day (BID) | ORAL | Status: DC
  Administered 2021-04-21 – 2021-04-25 (×5): 17 g via ORAL
  Filled 2021-04-20 (×7): qty 1

## 2021-04-20 MED ORDER — MORPHINE SULFATE (PF) 2 MG/ML IV SOLN
2.0000 mg | Freq: Four times a day (QID) | INTRAVENOUS | Status: DC | PRN
  Administered 2021-04-20 – 2021-04-24 (×12): 2 mg via INTRAVENOUS
  Filled 2021-04-20 (×12): qty 1

## 2021-04-20 MED ORDER — OXYCODONE HCL 5 MG PO TABS
10.0000 mg | ORAL_TABLET | Freq: Three times a day (TID) | ORAL | Status: DC
Start: 2021-04-20 — End: 2021-04-25
  Administered 2021-04-20 – 2021-04-25 (×13): 10 mg via ORAL
  Filled 2021-04-20 (×13): qty 2

## 2021-04-20 MED ORDER — BISACODYL 10 MG RE SUPP
10.0000 mg | Freq: Every day | RECTAL | Status: DC
  Administered 2021-04-20 – 2021-04-25 (×5): 10 mg via RECTAL
  Filled 2021-04-20 (×6): qty 1

## 2021-04-20 MED ORDER — FERROUS GLUCONATE 324 (38 FE) MG PO TABS
324.0000 mg | ORAL_TABLET | Freq: Two times a day (BID) | ORAL | Status: DC
  Administered 2021-04-21 – 2021-04-25 (×9): 324 mg via ORAL
  Filled 2021-04-20 (×11): qty 1

## 2021-04-20 MED ORDER — AMLODIPINE BESYLATE 10 MG PO TABS
10.0000 mg | ORAL_TABLET | Freq: Every day | ORAL | Status: DC
  Administered 2021-04-21 – 2021-04-25 (×4): 10 mg via ORAL
  Filled 2021-04-20 (×6): qty 1

## 2021-04-20 MED ORDER — ESCITALOPRAM OXALATE 10 MG PO TABS
5.0000 mg | ORAL_TABLET | Freq: Every day | ORAL | Status: DC
  Administered 2021-04-21 – 2021-04-25 (×5): 5 mg via ORAL
  Filled 2021-04-20 (×6): qty 0.5

## 2021-04-20 MED ORDER — INSULIN GLARGINE-YFGN 100 UNIT/ML ~~LOC~~ SOLN
18.0000 [IU] | Freq: Every day | SUBCUTANEOUS | Status: DC
  Administered 2021-04-21: 18 [IU] via SUBCUTANEOUS
  Filled 2021-04-20: qty 0.18

## 2021-04-20 MED ORDER — BISACODYL 10 MG RE SUPP: 10.0000 mg | Freq: Every day | RECTAL | Status: DC | PRN

## 2021-04-20 MED ORDER — OXYCODONE ER 9 MG PO C12A: 9.0000 mg | EXTENDED_RELEASE_CAPSULE | Freq: Two times a day (BID) | ORAL | Status: DC

## 2021-04-20 MED ORDER — OXYCODONE HCL 5 MG PO TABS: 10.0000 mg | ORAL_TABLET | Freq: Four times a day (QID) | ORAL | Status: DC | PRN

## 2021-04-20 NOTE — Progress Notes (Signed)
[facility]  - [place] at [facility]   PATIENT NAME: Eugene [name]    MR#:  [id number]  DATE OF BIRTH:  06/25/1961  SUBJECTIVE:   complains of some dry heaving. Complains of constipation. Tells me he takes suppository every other day. At the facility not much active From Bed to the Chair REVIEW OF SYSTEMS:   Review of Systems  Constitutional:  Negative for chills, fever and weight loss.  HENT:  Negative for ear discharge, ear pain and nosebleeds.   Eyes:  Negative for blurred vision, pain and discharge.  Respiratory:  Negative for sputum production, shortness of breath, wheezing and stridor.   Cardiovascular:  Negative for chest pain, palpitations, orthopnea and PND.  Gastrointestinal:  Positive for nausea. Negative for abdominal pain, diarrhea and vomiting.  Genitourinary:  Negative for frequency and urgency.  Musculoskeletal:  Negative for back pain and joint pain.  Neurological:  Positive for weakness. Negative for sensory change, speech change and focal weakness.  Psychiatric/Behavioral:  Negative for depression and hallucinations. The patient is not nervous/anxious.   Tolerating Diet: clear liquid Tolerating PT: bedbound wheelchair-bound  DRUG ALLERGIES:  No Known Allergies  VITALS:  Blood pressure (!) 146/79, pulse 69, temperature 98.4 F (36.9 C), resp. rate 18, height 5' (1.524 m), weight 109 kg, SpO2 94 %.  PHYSICAL EXAMINATION:   Physical Exam  GENERAL:  60 y.o.-year-old patient lying in the bed with no acute distress. Morbidly obese LUNGS: Normal breath sounds bilaterally, no wheezing, rales, rhonchi. No use of accessory muscles of respiration.  CARDIOVASCULAR: S1, S2 normal. No murmurs, rubs, or gallops.  ABDOMEN: Soft, nontender, nondistended. Bowel sounds present. No organomegaly or mass.  EXTREMITIES: bilateral amputation stump  NEUROLOGIC: nonfocal PSYCHIATRIC:  patient is alert and oriented x 3.  SKIN: No obvious rash, lesion, or  ulcer--per RN   LABORATORY PANEL:  CBC Recent Labs  Lab 04/20/21 0508  WBC 8.5  HGB 13.3  HCT 38.3*  PLT 164    Chemistries  Recent Labs  Lab 04/17/21 0215 04/18/21 1008 04/20/21 0508  NA 135   < > 140  K 3.8   < > 3.3*  CL 102   < > 108  CO2 22   < > 26  GLUCOSE 315*   < > 202*  BUN 16   < > 11  CREATININE 1.15   < > 0.76  CALCIUM 8.7*   < > 8.4*  MG  --    < > 2.0  AST 30  --   --   ALT 56*  --   --   ALKPHOS 78  --   --   BILITOT 1.0  --   --    < > = values in this interval not displayed.   Cardiac Enzymes No results for input(s): TROPONINI in the last 168 hours. RADIOLOGY:  No results found. ASSESSMENT AND PLAN:  Tong Pieczynski is a 60 y.o. male with medical history significant for Type 2 diabetes with gastroparesis, IBS, hepatitis C, anxiety and depression, chronic suprapubic catheter, recurrent fecal impaction who was sent to the ED for evaluation of intractable nausea and vomiting that started earlier in the day.  His symptoms feel similar to prior episodes of gastroparesis.  He has diffuse abdominal pain.   Intractable vomiting secondary to diabetic gastroparesis (HCC) --so far, inconsistent improvement in N/V and oral intake --cont IV Reglan QID --IV zofran PRN --back on clear liquid diet --consider GI consult if not improving  -- I  have asked patient to consider taking suppository to see if he has a bowel movement. Will advance diet as tolerated   Abdominal pain-- chronic chronic pain syndrome -- patient has been getting high doses of morphine. Now that he is not vomiting outpatient back on his long-acting PO narcotics and decreasing the dose of IV morphine. Patient is in agreement.   Medication non-compliance --pt intermittently refusing vital checks, scheduled meds and SSI.     Type 2 diabetes mellitus with hyperglycemia, with long-term current use of insulin (HCC) --SSI q4h for now --start Lantus 18 U daily     UTI (urinary tract infection),  ruled out   chronic Suprapubic catheter (HCC) - started on cefe on presentation, since d/c'ed. 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  No signs of infection currently. --Hold abx     HTN (hypertension) --on home hydralazine --IV hydralazine PRN if not able to take orals     DVT prophylaxis: Lovenox SQ Code Status: Full code  Family Communication:  Level of care: Med-Surg Dispo:   The patient is from: SNF/ [facility] Anticipated d/c is to: SNF Anticipated d/c date is: 2-3 days Patient currently is not medically ready to d/c due to: N/V, not able to have sufficient oral intake  Level of care: Med-Surg Status is: Inpatient  Remains inpatient appropriate because:IV treatments appropriate due to intensity of illness or inability to take PO        TOTAL TIME TAKING CARE OF THIS PATIENT: 25 minutes.  >50% time spent on counselling and coordination of care  Note: This dictation was prepared with Dragon dictation along with smaller phrase technology. Any transcriptional errors that result from this process are unintentional.  Enedina Finner M.D    [facility]   CC: Primary care physician; Abner Greenspan, MD Patient ID: [name], male   DOB: [birth year], 60 y.o.   MRN: [id number]

## 2021-04-20 NOTE — NC FL2 (Signed)
[place] MEDICAID FL2 LEVEL OF CARE SCREENING TOOL     IDENTIFICATION  Patient Name: Eugene [name] Birthdate: [birth year] Sex: male Admission Date (Current Location): 04/17/2021  [facility] and [place] Number:  Chiropodist and Address:  [facility], [address], [place], [place] [address]      Provider Number: [id number]  Attending Physician Name and Address:  Enedina Finner, MD  Relative Name and Phone Number:       Current Level of Care: Hospital Recommended Level of Care: Skilled Nursing Facility Prior Approval Number:    Date Approved/Denied:   PASRR Number:    Discharge Plan: SNF    Current Diagnoses: Patient Active Problem List   Diagnosis Date Noted   Diabetic gastroparesis (HCC) 04/17/2021   Suprapubic catheter dysfunction (HCC)    Pain    Constipation by delayed colonic transit    Nausea and vomiting 10/21/2019   Suprapubic catheter (HCC)    Sepsis (HCC) 10/02/2019   Thoracic spinal stenosis 02/12/2019   Osteomyelitis of thoracic spine (HCC) 02/08/2019   Peripheral vascular disease (HCC) 02/08/2019   HTN (hypertension) 02/08/2019   Dyslipidemia 02/08/2019   Chronic pain syndrome 02/08/2019   Polysubstance abuse (HCC) 02/08/2019   Chronic hepatitis C without hepatic coma (HCC) 02/08/2019   GERD (gastroesophageal reflux disease) 02/08/2019   Cigarette smoker 02/08/2019   Normocytic anemia 02/08/2019   UTI (urinary tract infection) 02/07/2019   AKI (acute kidney injury) (HCC) 02/07/2019   Hyperkalemia 02/07/2019   Pressure injury of skin 02/07/2019   Hyperlipidemia 11/07/2018   Drug-seeking behavior 11/07/2018   Abnormality of gait 10/17/2018   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   Abdominal pain 03/24/2018   Acute pain of left knee 01/23/2018   MRSA (methicillin resistant Staphylococcus aureus) septicemia (HCC) 08/17/2017   Bacteremia due to Staphylococcus aureus 08/15/2017   Other acute osteomyelitis, left ankle and  foot (HCC) 06/29/2017   Diabetic ulcer of right foot associated with diabetes mellitus due to underlying condition (HCC) 06/27/2017   Cellulitis of left foot 06/27/2017   Tobacco use disorder 06/27/2017   Type 2 diabetes mellitus with hyperglycemia, with long-term current use of insulin (HCC) 06/18/2017    Orientation RESPIRATION BLADDER Height & Weight     Self, Time, Situation, Place  Normal Incontinent Weight: 240 lb 4.8 oz (109 kg) Height:  5' (152.4 cm)  BEHAVIORAL SYMPTOMS/MOOD NEUROLOGICAL BOWEL NUTRITION STATUS      Incontinent Diet (clear liquid diet, subject to change, please see dc summary)  AMBULATORY STATUS COMMUNICATION OF NEEDS Skin   Extensive Assist Verbally Normal                       Personal Care Assistance Level of Assistance  Dressing, Feeding, Bathing Bathing Assistance: Maximum assistance Feeding assistance: Maximum assistance Dressing Assistance: Maximum assistance     Functional Limitations Info  Sight, Hearing, Speech Sight Info: Adequate Hearing Info: Adequate Speech Info: Adequate    SPECIAL CARE FACTORS FREQUENCY  PT (By licensed PT), OT (By licensed OT)     PT Frequency: 5x OT Frequency: 5x            Contractures Contractures Info: Not present    Additional Factors Info  Code Status, Allergies, Isolation Precautions Code Status Info: full code Allergies Info: no known allergies     Isolation Precautions Info: MRSA     Current Medications (04/20/2021):  This is the current hospital active medication list Current Facility-Administered Medications  Medication Dose Route Frequency Provider Last Rate Last Admin   0.9 %  sodium chloride infusion   Intravenous Continuous Rodriguez-Guzman, Raquel, RPH-CPP 50 mL/hr at 04/20/21 1159 New Bag at 04/20/21 1159   acetaminophen (TYLENOL) tablet 650 mg  650 mg Oral Q6H PRN Andris Baumann, MD       Or   acetaminophen (TYLENOL) suppository 650 mg  650 mg Rectal Q6H PRN Andris Baumann, MD        amLODipine (NORVASC) tablet 10 mg  10 mg Oral Daily Enedina Finner, MD       bisacodyl (DULCOLAX) suppository 10 mg  10 mg Rectal Daily PRN Enedina Finner, MD       enoxaparin (LOVENOX) injection 55 mg  0.5 mg/kg Subcutaneous Q24H Lindajo Royal V, MD   55 mg at 04/17/21 1059   escitalopram (LEXAPRO) tablet 5 mg  5 mg Oral Daily Enedina Finner, MD       famotidine (PEPCID) IVPB 20 mg premix  20 mg Intravenous Q12H Lowella Bandy, RPH 100 mL/hr at 04/20/21 0818 20 mg at 04/20/21 0818   ferrous gluconate (FERGON) tablet 324 mg  324 mg Oral BID WC Enedina Finner, MD       hydrALAZINE (APRESOLINE) injection 10 mg  10 mg Intravenous Q6H PRN Darlin Priestly, MD       hydrALAZINE (APRESOLINE) tablet 50 mg  50 mg Oral TID Darlin Priestly, MD       insulin aspart (novoLOG) injection 0-20 Units  0-20 Units Subcutaneous Q4H Andris Baumann, MD   4 Units at 04/20/21 1154   insulin glargine-yfgn (SEMGLEE) injection 10 Units  10 Units Subcutaneous Daily Darlin Priestly, MD   10 Units at 04/19/21 1300   [START ON 04/21/2021] linaclotide (LINZESS) capsule 145 mcg  145 mcg Oral QAC breakfast Enedina Finner, MD       melatonin tablet 5 mg  5 mg Oral QHS Enedina Finner, MD       metoCLOPramide (REGLAN) injection 10 mg  10 mg Intravenous QID Darlin Priestly, MD   10 mg at 04/20/21 1154   metoprolol succinate (TOPROL-XL) 24 hr tablet 25 mg  25 mg Oral Daily Enedina Finner, MD       morphine 4 MG/ML injection 4 mg  4 mg Intravenous Q6H PRN Darlin Priestly, MD   4 mg at 04/20/21 1045   ondansetron (ZOFRAN) tablet 4 mg  4 mg Oral Q6H PRN Andris Baumann, MD       Or   ondansetron [facility]) injection 4 mg  4 mg Intravenous Q6H PRN Andris Baumann, MD   4 mg at 04/20/21 1045   oxyCODONE (Oxy IR/ROXICODONE) immediate release tablet 10 mg  10 mg Oral Q8H Enedina Finner, MD       potassium chloride 10 mEq in 100 mL IVPB  10 mEq Intravenous Q1 Hr x 4 Rodriguez-Guzman, Raquel, RPH-CPP 100 mL/hr at 04/20/21 1352 10 mEq at 04/20/21 1352   promethazine (PHENERGAN) 12.5 mg in  sodium chloride 0.9 % 50 mL IVPB  12.5 mg Intravenous Q6H PRN Andris Baumann, MD 200 mL/hr at 04/20/21 0347 12.5 mg at 04/20/21 0347     Discharge Medications: Please see discharge summary for a list of discharge medications.  Relevant Imaging Results:  Relevant Lab Results:   Additional Information SSN:[id number]  Reuel Boom Emerita Berkemeier, LCSW

## 2021-04-20 NOTE — TOC Initial Note (Signed)
Transition of Care [facility]) - Initial/Assessment Note    Patient Details  Name: Eugene [name] MRN: [id number] Date of Birth: 10-21-1960  Transition of Care [facility]) CM/SW Contact:    Maree Krabbe, LCSW Phone Number: 04/20/2021, 2:48 PM  Clinical Narrative:  Pt is a long term care resident at [facility] and will return at d/c. Pt gets physician care and prescriptions from within the facility.             Expected Discharge Plan: Skilled Nursing Facility Barriers to Discharge: Continued Medical Work up   Patient Goals and CMS Choice     Choice offered to / list presented to : Patient  Expected Discharge Plan and Services Expected Discharge Plan: Skilled Nursing Facility In-house Referral: Clinical Social Work   Post Acute Care Choice: Skilled Nursing Facility Living arrangements for the past 2 months: Skilled Nursing Facility                                      Prior Living Arrangements/Services Living arrangements for the past 2 months: Skilled Nursing Facility Lives with:: Facility Resident Patient language and need for interpreter reviewed:: Yes Do you feel safe going back to the place where you live?: Yes      Need for Family Participation in Patient Care: Yes (Comment) Care giver support system in place?: Yes (comment)   Criminal Activity/Legal Involvement Pertinent to Current Situation/Hospitalization: No - Comment as needed  Activities of Daily Living Home Assistive Devices/Equipment: Wheelchair ADL Screening (condition at time of admission) Patient's cognitive ability adequate to safely complete daily activities?: Yes Is the patient deaf or have difficulty hearing?: No Does the patient have difficulty seeing, even when wearing glasses/contacts?: No Does the patient have difficulty concentrating, remembering, or making decisions?: No Patient able to express need for assistance with ADLs?: Yes Does the patient have difficulty dressing or bathing?:  Yes Independently performs ADLs?: No Communication: Independent Dressing (OT): Needs assistance Is this a change from baseline?: Pre-admission baseline Grooming: Needs assistance Is this a change from baseline?: Pre-admission baseline Feeding: Independent Bathing: Needs assistance Is this a change from baseline?: Pre-admission baseline Toileting: Dependent Is this a change from baseline?: Pre-admission baseline In/Out Bed: Dependent Is this a change from baseline?: Pre-admission baseline Walks in Home: Dependent Is this a change from baseline?: Pre-admission baseline Does the patient have difficulty walking or climbing stairs?: No Weakness of Legs: Both (bilateral aka) Weakness of Arms/Hands: None  Permission Sought/Granted Permission sought to share information with : Family Supports    Share Information with NAME: lance  Permission granted to share info w AGENCY: [facility]  Permission granted to share info w Relationship: brother     Emotional Assessment Appearance:: Appears stated age Attitude/Demeanor/Rapport: Engaged   Orientation: : Oriented to Self, Oriented to Place, Oriented to  Time, Oriented to Situation Alcohol / Substance Use: Not Applicable Psych Involvement: No (comment)  Admission diagnosis:  Gastroparesis [K31.84] Generalized abdominal pain [R10.84] Diabetic gastroparesis (HCC) [E11.43, K31.84] Acute cystitis without hematuria [N30.00] Esophagitis [K20.90] Nausea and vomiting, intractability of vomiting not specified, unspecified vomiting type [R11.2] Sepsis, due to unspecified organism, unspecified whether acute organ dysfunction present [facility]) [A41.9] Patient Active Problem List   Diagnosis Date Noted   Diabetic gastroparesis (HCC) 04/17/2021   Suprapubic catheter dysfunction (HCC)    Pain    Constipation by delayed colonic transit    Nausea and vomiting  10/21/2019   Suprapubic catheter (HCC)    Sepsis (HCC) 10/02/2019   Thoracic spinal  stenosis 02/12/2019   Osteomyelitis of thoracic spine (HCC) 02/08/2019   Peripheral vascular disease (HCC) 02/08/2019   HTN (hypertension) 02/08/2019   Dyslipidemia 02/08/2019   Chronic pain syndrome 02/08/2019   Polysubstance abuse (HCC) 02/08/2019   Chronic hepatitis C without hepatic coma (HCC) 02/08/2019   GERD (gastroesophageal reflux disease) 02/08/2019   Cigarette smoker 02/08/2019   Normocytic anemia 02/08/2019   UTI (urinary tract infection) 02/07/2019   AKI (acute kidney injury) (HCC) 02/07/2019   Hyperkalemia 02/07/2019   Pressure injury of skin 02/07/2019   Hyperlipidemia 11/07/2018   Drug-seeking behavior 11/07/2018   Abnormality of gait 10/17/2018   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   Abdominal pain 03/24/2018   Acute pain of left knee 01/23/2018   MRSA (methicillin resistant Staphylococcus aureus) septicemia (HCC) 08/17/2017   Bacteremia due to Staphylococcus aureus 08/15/2017   Other acute osteomyelitis, left ankle and foot (HCC) 06/29/2017   Diabetic ulcer of right foot associated with diabetes mellitus due to underlying condition (HCC) 06/27/2017   Cellulitis of left foot 06/27/2017   Tobacco use disorder 06/27/2017   Type 2 diabetes mellitus with hyperglycemia, with long-term current use of insulin (HCC) 06/18/2017   PCP:  Abner Greenspan, MD Pharmacy:   [facility] [place], [place] - [address] [place] [place] [address] Phone: [phone] Fax: [phone]     Social Determinants of Health (SDOH) Interventions    Readmission Risk Interventions Readmission Risk Prevention Plan 04/20/2021 10/05/2019 03/04/2019  Transportation Screening Complete Complete Complete  PCP or Specialist Appt within 3-5 Days Complete - -  HRI or Home Care Consult Complete - -  Social Work Consult for Recovery Care Planning/Counseling Complete - -  Palliative Care Screening Not Applicable - -  Medication Review [name]) Complete  Complete Complete  PCP or Specialist appointment within 3-5 days of discharge - Complete -  HRI or Home Care Consult - Complete -  SW Recovery Care/Counseling Consult - Complete -  Palliative Care Screening - Not Applicable Not Applicable  Skilled Nursing Facility - Complete Complete

## 2021-04-20 NOTE — Progress Notes (Signed)
PHARMACY CONSULT NOTE - FOLLOW UP  Pharmacy Consult for Electrolyte Monitoring and Replacement   Recent Labs: Potassium (mmol/L)  Date Value  04/20/2021 3.3 (L)   Magnesium (mg/dL)  Date Value  12/87/8676 2.0   Calcium (mg/dL)  Date Value  72/05/4708 8.4 (L)   Albumin (g/dL)  Date Value  62/83/6629 3.6   Phosphorus (mg/dL)  Date Value  47/65/4650 3.6   Sodium (mmol/L)  Date Value  04/20/2021 140     Assessment: Patient with gastroparesis d/t uncontrolled diabetes. KCl x 3 given on 04/19/2021. K at 3.3 today form 3.3 yesterday (no improvement)  Goal of Therapy:  Electrolytes WNL  Plan:  Kcl 10mg  every 1hr x 4 doses (no change after 30 mEq given yesterday ago).  Raynetta Osterloh Rodriguez-Guzman PharmD, BCPS, 04/20/2021 10:28 AM

## 2021-04-21 DIAGNOSIS — Z794 Long term (current) use of insulin: Secondary | ICD-10-CM

## 2021-04-21 DIAGNOSIS — E1165 Type 2 diabetes mellitus with hyperglycemia: Secondary | ICD-10-CM

## 2021-04-21 DIAGNOSIS — Z9359 Other cystostomy status: Secondary | ICD-10-CM | POA: Diagnosis not present

## 2021-04-21 DIAGNOSIS — R112 Nausea with vomiting, unspecified: Secondary | ICD-10-CM | POA: Diagnosis not present

## 2021-04-21 DIAGNOSIS — E1143 Type 2 diabetes mellitus with diabetic autonomic (poly)neuropathy: Secondary | ICD-10-CM | POA: Diagnosis not present

## 2021-04-21 LAB — MAGNESIUM: Magnesium: 1.9 mg/dL (ref 1.7–2.4)

## 2021-04-21 LAB — GLUCOSE, CAPILLARY
Glucose-Capillary: 150 mg/dL — ABNORMAL HIGH (ref 70–99)
Glucose-Capillary: 157 mg/dL — ABNORMAL HIGH (ref 70–99)
Glucose-Capillary: 166 mg/dL — ABNORMAL HIGH (ref 70–99)
Glucose-Capillary: 178 mg/dL — ABNORMAL HIGH (ref 70–99)
Glucose-Capillary: 190 mg/dL — ABNORMAL HIGH (ref 70–99)
Glucose-Capillary: 208 mg/dL — ABNORMAL HIGH (ref 70–99)
Glucose-Capillary: 212 mg/dL — ABNORMAL HIGH (ref 70–99)

## 2021-04-21 LAB — BASIC METABOLIC PANEL
Anion gap: 4 — ABNORMAL LOW (ref 5–15)
BUN: 8 mg/dL (ref 6–20)
CO2: 26 mmol/L (ref 22–32)
Calcium: 8.4 mg/dL — ABNORMAL LOW (ref 8.9–10.3)
Chloride: 106 mmol/L (ref 98–111)
Creatinine, Ser: 0.63 mg/dL (ref 0.61–1.24)
GFR, Estimated: >60 mL/min (ref >60)
Glucose, Bld: 155 mg/dL — ABNORMAL HIGH (ref 70–99)
Potassium: 3.8 mmol/L (ref 3.5–5.1)
Sodium: 136 mmol/L (ref 135–145)

## 2021-04-21 MED ORDER — FAMOTIDINE 20 MG PO TABS
20.0000 mg | ORAL_TABLET | Freq: Every day | ORAL | Status: DC
  Administered 2021-04-21 – 2021-04-24 (×4): 20 mg via ORAL
  Filled 2021-04-21 (×4): qty 1

## 2021-04-21 MED ORDER — SODIUM CHLORIDE 0.9 % IV SOLN
500.0000 mg | Freq: Three times a day (TID) | INTRAVENOUS | Status: DC
  Administered 2021-04-21 – 2021-04-24 (×9): 500 mg via INTRAVENOUS
  Filled 2021-04-21 (×12): qty 10

## 2021-04-21 MED ORDER — INSULIN GLARGINE-YFGN 100 UNIT/ML ~~LOC~~ SOLN
25.0000 [IU] | Freq: Every day | SUBCUTANEOUS | Status: DC
  Administered 2021-04-23 – 2021-04-25 (×3): 25 [IU] via SUBCUTANEOUS
  Filled 2021-04-21 (×5): qty 0.25

## 2021-04-21 NOTE — Progress Notes (Signed)
[facility]  - [place] at [facility]   PATIENT NAME: Eugene [name]    MR#:  [id number]  DATE OF BIRTH:  May 21, 1961  SUBJECTIVE:   complains of some dry heaving. Vomitng today. Trying broth and ginger ale Complains of constipation. Tells me he takes suppository every other day. At the facility not much active From Bed to the Chair REVIEW OF SYSTEMS:   Review of Systems  Constitutional:  Negative for chills, fever and weight loss.  HENT:  Negative for ear discharge, ear pain and nosebleeds.   Eyes:  Negative for blurred vision, pain and discharge.  Respiratory:  Negative for sputum production, shortness of breath, wheezing and stridor.   Cardiovascular:  Negative for chest pain, palpitations, orthopnea and PND.  Gastrointestinal:  Positive for nausea. Negative for abdominal pain, diarrhea and vomiting.  Genitourinary:  Negative for frequency and urgency.  Musculoskeletal:  Negative for back pain and joint pain.  Neurological:  Positive for weakness. Negative for sensory change, speech change and focal weakness.  Psychiatric/Behavioral:  Negative for depression and hallucinations. The patient is not nervous/anxious.   Tolerating Diet: clear liquid Tolerating PT: bedbound wheelchair-bound  DRUG ALLERGIES:  No Known Allergies  VITALS:  Blood pressure (!) 168/86, pulse 74, temperature 98.9 F (37.2 C), temperature source Oral, resp. rate 20, height 5' (1.524 m), weight 109 kg, SpO2 97 %.  PHYSICAL EXAMINATION:   Physical Exam  GENERAL:  60 y.o.-year-old patient lying in the bed with no acute distress. Morbidly obese LUNGS: Normal breath sounds bilaterally, no wheezing, rales, rhonchi. No use of accessory muscles of respiration.  CARDIOVASCULAR: S1, S2 normal. No murmurs, rubs, or gallops.  ABDOMEN: Soft, nontender, nondistended. Bowel sounds present. No organomegaly or mass.  EXTREMITIES: bilateral amputation stump  NEUROLOGIC: nonfocal PSYCHIATRIC:  patient is  alert and oriented x 3.  SKIN: No obvious rash, lesion, or ulcer--per RN   LABORATORY PANEL:  CBC Recent Labs  Lab 04/20/21 0508  WBC 8.5  HGB 13.3  HCT 38.3*  PLT 164     Chemistries  Recent Labs  Lab 04/17/21 0215 04/18/21 1008 04/21/21 0433  NA 135   < > 136  K 3.8   < > 3.8  CL 102   < > 106  CO2 22   < > 26  GLUCOSE 315*   < > 155*  BUN 16   < > 8  CREATININE 1.15   < > 0.63  CALCIUM 8.7*   < > 8.4*  MG  --    < > 1.9  AST 30  --   --   ALT 56*  --   --   ALKPHOS 78  --   --   BILITOT 1.0  --   --    < > = values in this interval not displayed.    Cardiac Enzymes No results for input(s): TROPONINI in the last 168 hours. RADIOLOGY:  No results found. ASSESSMENT AND PLAN:  Eugene [name] is a 60 y.o. male with medical history significant for Type 2 diabetes with gastroparesis, IBS, hepatitis C, anxiety and depression, chronic suprapubic catheter, recurrent fecal impaction who was sent to the ED for evaluation of intractable nausea and vomiting that started earlier in the day.  His symptoms feel similar to prior episodes of gastroparesis.  He has diffuse abdominal pain.   Intractable vomiting secondary to severe diabetic gastroparesis (HCC) --so far, inconsistent improvement in N/V and oral intake --cont IV Reglan QID --IV zofran PRN --back  on clear liquid diet --consider GI consult if not improving  -- I have asked patient to consider taking suppository to see if he has a bowel movement. Will advance diet as tolerated --7/29-- continues to vomiting intermittently although trying to take some clear liquid diet. Patient on IV erythromycin -- discussed with patient that his morphine is worsening his bowel movement motility however patient says that's the only thing that helps them with pain.   Abdominal pain-- chronic chronic pain syndrome -- patient has been getting high doses of morphine. Now that he is not vomiting outpatient back on his long-acting PO  narcotics and decreasing the dose of IV morphine. Patient is in agreement.   Medication non-compliance --pt intermittently refusing vital checks, scheduled meds and SSI.     Type 2 diabetes mellitus with hyperglycemia, with long-term current use of insulin (HCC) --SSI q4h for now --start Lantus 18 U daily     UTI (urinary tract infection), ruled out   chronic Suprapubic catheter (HCC) - started on cefe on presentation, since d/c'ed. 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  No signs of infection currently. --Hold abx     HTN (hypertension) --on home hydralazine --IV hydralazine PRN if not able to take orals     DVT prophylaxis: Lovenox SQ Code Status: Full code  Family Communication:  Level of care: Med-Surg Dispo:   The patient is from: SNF/ [facility] Anticipated d/c is to: SNF Anticipated d/c date is: 2-3 days Patient currently is not medically ready to d/c due to: N/V, not able to have sufficient oral intake  Level of care: Med-Surg Status is: Inpatient  Remains inpatient appropriate because:IV treatments appropriate due to intensity of illness or inability to take PO        TOTAL TIME TAKING CARE OF THIS PATIENT: 25 minutes.  >50% time spent on counselling and coordination of care  Note: This dictation was prepared with Dragon dictation along with smaller phrase technology. Any transcriptional errors that result from this process are unintentional.  Eugene [name] M.D    [facility]   CC: Primary care physician; Eugene Greenspan, MD Patient ID: Eugene [name], male   DOB: [birth year], 60 y.o.   MRN: [id number]

## 2021-04-21 NOTE — Progress Notes (Signed)
PHARMACY CONSULT NOTE - FOLLOW UP  Pharmacy Consult for Electrolyte Monitoring and Replacement   Recent Labs: Potassium (mmol/L)  Date Value  04/21/2021 3.8   Magnesium (mg/dL)  Date Value  07/21/2535 1.9   Calcium (mg/dL)  Date Value  64/40/3474 8.4 (L)   Albumin (g/dL)  Date Value  25/95/6387 3.6   Phosphorus (mg/dL)  Date Value  56/43/3295 3.6   Sodium (mmol/L)  Date Value  04/21/2021 136     Assessment: Patient with gastroparesis d/t uncontrolled diabetes. KCl x 3 given on 04/19/2021.  K at 3.3 on 7/28 form 3.3 on 7/27.  Pharmacy consulted for electrolytes management. KCL q 1hr x 4 given, IV changed to NS @ 4ml/hr  Goal of Therapy:  Electrolytes WNL  Plan:  K, phos, and Mg within normal limits today. Will continue to monitor BMET and adjust treatment as needed.  Ornella Coderre Rodriguez-Guzman PharmD, BCPS, 04/21/2021 11:12 AM

## 2021-04-22 DIAGNOSIS — K3184 Gastroparesis: Secondary | ICD-10-CM | POA: Diagnosis not present

## 2021-04-22 DIAGNOSIS — E1143 Type 2 diabetes mellitus with diabetic autonomic (poly)neuropathy: Secondary | ICD-10-CM | POA: Diagnosis not present

## 2021-04-22 LAB — GLUCOSE, CAPILLARY
Glucose-Capillary: 73 mg/dL (ref 70–99)
Glucose-Capillary: 123 mg/dL — ABNORMAL HIGH (ref 70–99)
Glucose-Capillary: 177 mg/dL — ABNORMAL HIGH (ref 70–99)
Glucose-Capillary: 174 mg/dL — ABNORMAL HIGH (ref 70–99)
Glucose-Capillary: 170 mg/dL — ABNORMAL HIGH (ref 70–99)

## 2021-04-22 LAB — BASIC METABOLIC PANEL
Anion gap: 5 (ref 5–15)
BUN: 10 mg/dL (ref 6–20)
CO2: 25 mmol/L (ref 22–32)
Calcium: 8.2 mg/dL — ABNORMAL LOW (ref 8.9–10.3)
Chloride: 108 mmol/L (ref 98–111)
Creatinine, Ser: 1 mg/dL (ref 0.61–1.24)
GFR, Estimated: >60 mL/min (ref >60)
Glucose, Bld: 113 mg/dL — ABNORMAL HIGH (ref 70–99)
Potassium: 3.3 mmol/L — ABNORMAL LOW (ref 3.5–5.1)
Sodium: 138 mmol/L (ref 135–145)

## 2021-04-22 MED ORDER — POTASSIUM CHLORIDE 10 MEQ/100ML IV SOLN
10.0000 meq | INTRAVENOUS | Status: AC
  Administered 2021-04-22 (×4): 10 meq via INTRAVENOUS
  Filled 2021-04-22: qty 100

## 2021-04-22 NOTE — Progress Notes (Signed)
[facility]  - [place] at [facility]   PATIENT NAME: Eugene [name]    MR#:  [id number]  DATE OF BIRTH:  Dec 30, 1960  SUBJECTIVE:  tolerating broth today. Took his PO meds. No vomiting. No bowel movement yet. Patient has not eaten in many days. At the facility not much active From Bed to the Chair REVIEW OF SYSTEMS:   Review of Systems  Constitutional:  Negative for chills, fever and weight loss.  HENT:  Negative for ear discharge, ear pain and nosebleeds.   Eyes:  Negative for blurred vision, pain and discharge.  Respiratory:  Negative for sputum production, shortness of breath, wheezing and stridor.   Cardiovascular:  Negative for chest pain, palpitations, orthopnea and PND.  Gastrointestinal:  Positive for nausea. Negative for abdominal pain, diarrhea and vomiting.  Genitourinary:  Negative for frequency and urgency.  Musculoskeletal:  Negative for back pain and joint pain.  Neurological:  Positive for weakness. Negative for sensory change, speech change and focal weakness.  Psychiatric/Behavioral:  Negative for depression and hallucinations. The patient is not nervous/anxious.   Tolerating Diet: clear liquid Tolerating PT: bedbound wheelchair-bound  DRUG ALLERGIES:  No Known Allergies  VITALS:  Blood pressure 123/74, pulse 67, temperature 98.1 F (36.7 C), temperature source Oral, resp. rate 18, height 5' (1.524 m), weight 109 kg, SpO2 95 %.  PHYSICAL EXAMINATION:   Physical Exam  GENERAL:  60 y.o.-year-old patient lying in the bed with no acute distress. Morbidly obese LUNGS: Normal breath sounds bilaterally, no wheezing, rales, rhonchi. No use of accessory muscles of respiration.  CARDIOVASCULAR: S1, S2 normal. No murmurs, rubs, or gallops.  ABDOMEN: Soft, nontender, nondistended. Bowel sounds present. No organomegaly or mass.  EXTREMITIES: bilateral amputation stump  NEUROLOGIC: nonfocal PSYCHIATRIC:  patient is alert and oriented x 3.  SKIN: No  obvious rash, lesion, or ulcer--per RN   LABORATORY PANEL:  CBC Recent Labs  Lab 04/20/21 0508  WBC 8.5  HGB 13.3  HCT 38.3*  PLT 164     Chemistries  Recent Labs  Lab 04/17/21 0215 04/18/21 1008 04/21/21 0433 04/22/21 0443  NA 135   < > 136 138  K 3.8   < > 3.8 3.3*  CL 102   < > 106 108  CO2 22   < > 26 25  GLUCOSE 315*   < > 155* 113*  BUN 16   < > 8 10  CREATININE 1.15   < > 0.63 1.00  CALCIUM 8.7*   < > 8.4* 8.2*  MG  --    < > 1.9  --   AST 30  --   --   --   ALT 56*  --   --   --   ALKPHOS 78  --   --   --   BILITOT 1.0  --   --   --    < > = values in this interval not displayed.    Cardiac Enzymes No results for input(s): TROPONINI in the last 168 hours. RADIOLOGY:  No results found. ASSESSMENT AND PLAN:  Kaidence Callaway is a 60 y.o. male with medical history significant for Type 2 diabetes with gastroparesis, IBS, hepatitis C, anxiety and depression, chronic suprapubic catheter, recurrent fecal impaction who was sent to the ED for evaluation of intractable nausea and vomiting that started earlier in the day.  His symptoms feel similar to prior episodes of gastroparesis.  He has diffuse abdominal pain.   Intractable vomiting secondary to severe  diabetic gastroparesis (HCC) --so far, inconsistent improvement in N/V and oral intake --cont IV Reglan QID --IV zofran PRN --back on clear liquid diet --consider GI consult if not improving  -- I have asked patient to consider taking suppository to see if he has a bowel movement. Will advance diet as tolerated --7/29-- continues to vomiting intermittently although trying to take some clear liquid diet. Patient on IV erythromycin -- discussed with patient that his morphine is worsening his bowel movement motility however patient says that's the only thing that helps them with pain. --7/30-- cont IV erythromycin for severe gastroparesis   Abdominal pain-- chronic chronic pain syndrome -- patient has been getting  high doses of morphine. Now that he is not vomiting outpatient back on his long-acting PO narcotics and decreasing the dose of IV morphine. Patient is in agreement.   Medication non-compliance --pt intermittently refusing vital checks, scheduled meds and SSI.     Type 2 diabetes mellitus with hyperglycemia, with long-term current use of insulin (HCC) --SSI q4h for now --start Lantus 18 U daily     UTI (urinary tract infection), ruled out   chronic Suprapubic catheter (HCC) - started on cefe on presentation, since d/c'ed. 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  No signs of infection currently. --Hold abx     HTN (hypertension) --on home hydralazine --IV hydralazine PRN if not able to take orals     DVT prophylaxis: Lovenox SQ Code Status: Full code  Family Communication:  Level of care: Med-Surg Dispo:   The patient is from: SNF/ [facility] Anticipated d/c is to: SNF Anticipated d/c date is: 2-3 days Patient currently is not medically ready to d/c due to: N/V, not able to have sufficient oral intake  Level of care: Med-Surg Status is: Inpatient  Remains inpatient appropriate because:IV treatments appropriate due to intensity of illness or inability to take PO        TOTAL TIME TAKING CARE OF THIS PATIENT: 23 minutes.  >50% time spent on counselling and coordination of care  Note: This dictation was prepared with Dragon dictation along with smaller phrase technology. Any transcriptional errors that result from this process are unintentional.  Enedina Finner M.D    [facility]   CC: Primary care physician; Abner Greenspan, MD Patient ID: [name], male   DOB: June 07, 1961, 60 y.o.   MRN: [id number]

## 2021-04-22 NOTE — Progress Notes (Signed)
PHARMACY CONSULT NOTE - FOLLOW UP  Pharmacy Consult for Electrolyte Monitoring and Replacement   Recent Labs: Potassium (mmol/L)  Date Value  04/22/2021 3.3 (L)   Magnesium (mg/dL)  Date Value  40/97/3532 1.9   Calcium (mg/dL)  Date Value  99/24/2683 8.2 (L)   Albumin (g/dL)  Date Value  41/96/2229 3.6   Phosphorus (mg/dL)  Date Value  79/89/2119 3.6   Sodium (mmol/L)  Date Value  04/22/2021 138     Assessment: Patient with gastroparesis d/t uncontrolled diabetes.  - KCl x 3 given on 04/19/2021; K at 3.3>3.3.  Pharmacy consulted for electrolytes management.  K 3.3 ( IV)>3.3 ( IV) > 3.8 >3.3  Goal of Therapy:  Electrolytes WNL  Plan:  K3.8>3.3 will replete with KCL IV q1h x4 doses. Will continue to monitor BMET and adjust treatment as needed.  Martyn Malay, RPH,BCCP 04/22/2021 9:13 AM

## 2021-04-23 DIAGNOSIS — E1143 Type 2 diabetes mellitus with diabetic autonomic (poly)neuropathy: Secondary | ICD-10-CM | POA: Diagnosis not present

## 2021-04-23 DIAGNOSIS — K3184 Gastroparesis: Secondary | ICD-10-CM | POA: Diagnosis not present

## 2021-04-23 LAB — GLUCOSE, CAPILLARY
Glucose-Capillary: 116 mg/dL — ABNORMAL HIGH (ref 70–99)
Glucose-Capillary: 122 mg/dL — ABNORMAL HIGH (ref 70–99)
Glucose-Capillary: 144 mg/dL — ABNORMAL HIGH (ref 70–99)
Glucose-Capillary: 146 mg/dL — ABNORMAL HIGH (ref 70–99)
Glucose-Capillary: 150 mg/dL — ABNORMAL HIGH (ref 70–99)
Glucose-Capillary: 168 mg/dL — ABNORMAL HIGH (ref 70–99)

## 2021-04-23 LAB — CBC
HCT: 37.4 % — ABNORMAL LOW (ref 39.0–52.0)
Hemoglobin: 12.8 g/dL — ABNORMAL LOW (ref 13.0–17.0)
MCH: 31.8 pg (ref 26.0–34.0)
MCHC: 34.2 g/dL (ref 30.0–36.0)
MCV: 92.8 fL (ref 80.0–100.0)
Platelets: 153 10*3/uL (ref 150–400)
RBC: 4.03 MIL/uL — ABNORMAL LOW (ref 4.22–5.81)
RDW: 12.2 % (ref 11.5–15.5)
WBC: 7.1 10*3/uL (ref 4.0–10.5)
nRBC: 0 % (ref 0.0–0.2)

## 2021-04-23 LAB — PHOSPHORUS: Phosphorus: 4 mg/dL (ref 2.5–4.6)

## 2021-04-23 LAB — BASIC METABOLIC PANEL
Anion gap: 6 (ref 5–15)
BUN: 11 mg/dL (ref 6–20)
CO2: 24 mmol/L (ref 22–32)
Calcium: 8.4 mg/dL — ABNORMAL LOW (ref 8.9–10.3)
Chloride: 107 mmol/L (ref 98–111)
Creatinine, Ser: 0.84 mg/dL (ref 0.61–1.24)
GFR, Estimated: >60 mL/min (ref >60)
Glucose, Bld: 142 mg/dL — ABNORMAL HIGH (ref 70–99)
Potassium: 4 mmol/L (ref 3.5–5.1)
Sodium: 137 mmol/L (ref 135–145)

## 2021-04-23 LAB — MAGNESIUM: Magnesium: 1.8 mg/dL (ref 1.7–2.4)

## 2021-04-23 NOTE — Progress Notes (Signed)
[facility]  - [place] at [facility]   PATIENT NAME: Eugene [name]    MR#:  [id number]  DATE OF BIRTH:  1961-04-30  SUBJECTIVE:  tolerating broth today. Took his PO meds. No vomiting. No bowel movement yet. Patient has not eaten in many days. At the facility not much active From Bed to the Chair  Wants to try soup REVIEW OF SYSTEMS:   Review of Systems  Constitutional:  Negative for chills, fever and weight loss.  HENT:  Negative for ear discharge, ear pain and nosebleeds.   Eyes:  Negative for blurred vision, pain and discharge.  Respiratory:  Negative for sputum production, shortness of breath, wheezing and stridor.   Cardiovascular:  Negative for chest pain, palpitations, orthopnea and PND.  Gastrointestinal:  Positive for nausea. Negative for abdominal pain, diarrhea and vomiting.  Genitourinary:  Negative for frequency and urgency.  Musculoskeletal:  Negative for back pain and joint pain.  Neurological:  Positive for weakness. Negative for sensory change, speech change and focal weakness.  Psychiatric/Behavioral:  Negative for depression and hallucinations. The patient is not nervous/anxious.   Tolerating Diet: clear liquid Tolerating PT: bedbound wheelchair-bound  DRUG ALLERGIES:  No Known Allergies  VITALS:  Blood pressure (!) 123/56, pulse 69, temperature 98.6 F (37 C), temperature source Oral, resp. rate 14, height 5' (1.524 m), weight 109 kg, SpO2 100 %.  PHYSICAL EXAMINATION:   Physical Exam  GENERAL:  60 y.o.-year-old patient lying in the bed with no acute distress. Morbidly obese LUNGS: Normal breath sounds bilaterally, no wheezing, rales, rhonchi. No use of accessory muscles of respiration.  CARDIOVASCULAR: S1, S2 normal. No murmurs, rubs, or gallops.  ABDOMEN: Soft, nontender, nondistended. Bowel sounds present. No organomegaly or mass.  EXTREMITIES: bilateral amputation stump  NEUROLOGIC: nonfocal PSYCHIATRIC:  patient is alert and  oriented x 3.  SKIN: No obvious rash, lesion, or ulcer--per RN   LABORATORY PANEL:  CBC Recent Labs  Lab 04/23/21 0521  WBC 7.1  HGB 12.8*  HCT 37.4*  PLT 153     Chemistries  Recent Labs  Lab 04/17/21 0215 04/18/21 1008 04/23/21 0521  NA 135   < > 137  K 3.8   < > 4.0  CL 102   < > 107  CO2 22   < > 24  GLUCOSE 315*   < > 142*  BUN 16   < > 11  CREATININE 1.15   < > 0.84  CALCIUM 8.7*   < > 8.4*  MG  --    < > 1.8  AST 30  --   --   ALT 56*  --   --   ALKPHOS 78  --   --   BILITOT 1.0  --   --    < > = values in this interval not displayed.    Cardiac Enzymes No results for input(s): TROPONINI in the last 168 hours. RADIOLOGY:  No results found. ASSESSMENT AND PLAN:  Eugene [name] is a 60 y.o. male with medical history significant for Type 2 diabetes with gastroparesis, IBS, hepatitis C, anxiety and depression, chronic suprapubic catheter, recurrent fecal impaction who was sent to the ED for evaluation of intractable nausea and vomiting that started earlier in the day.  His symptoms feel similar to prior episodes of gastroparesis.  He has diffuse abdominal pain.   Intractable vomiting secondary to severe diabetic gastroparesis (HCC) --so far, inconsistent improvement in N/V and oral intake --cont IV Reglan QID --IV zofran PRN --  back on clear liquid diet --consider GI consult if not improving  -- I have asked patient to consider taking suppository to see if he has a bowel movement. Will advance diet as tolerated --7/29-- continues to vomiting intermittently although trying to take some clear liquid diet. Patient on IV erythromycin -- discussed with patient that his morphine is worsening his bowel movement motility however patient says that's the only thing that helps them with pain. --7/30-- cont IV erythromycin for severe gastroparesis --7/31--no vomiting. RN/CNA to document output accurately.  Pt wants to try FLD   Abdominal pain-- chronic chronic pain  syndrome -- patient has been getting high doses of morphine. Now that he is not vomiting outpatient back on his long-acting PO narcotics and decreasing the dose of IV morphine. Patient is in agreement.   Medication non-compliance --pt intermittently refusing vital checks, scheduled meds and SSI.     Type 2 diabetes mellitus with hyperglycemia, with long-term current use of insulin (HCC) --SSI q4h for now --start Lantus 18 U daily     UTI (urinary tract infection), ruled out   chronic Suprapubic catheter (HCC) - started on cefe on presentation, since d/c'ed. 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  No signs of infection currently. --Hold abx     HTN (hypertension) --on home hydralazine --IV hydralazine PRN if not able to take orals     DVT prophylaxis: Lovenox SQ Code Status: Full code  Family Communication:  Level of care: Med-Surg Dispo:   The patient is from: SNF/ [facility] Anticipated d/c is to: SNF Anticipated d/c date is: 2-3 days Patient currently is not medically ready to d/c due to: N/V, not able to have sufficient oral intake  Level of care: Med-Surg Status is: Inpatient  Remains inpatient appropriate because:IV treatments appropriate due to intensity of illness or inability to take PO        TOTAL TIME TAKING CARE OF THIS PATIENT: 23 minutes.  >50% time spent on counselling and coordination of care  Note: This dictation was prepared with Dragon dictation along with smaller phrase technology. Any transcriptional errors that result from this process are unintentional.  Enedina Finner M.D    [facility]   CC: Primary care physician; Abner Greenspan, MD Patient ID: Eugene [name], male   DOB: 1960/10/02, 60 y.o.   MRN: [id number]

## 2021-04-23 NOTE — Progress Notes (Signed)
PHARMACY CONSULT NOTE - FOLLOW UP  Pharmacy Consult for Electrolyte Monitoring and Replacement   Recent Labs: Potassium (mmol/L)  Date Value  04/23/2021 4.0   Magnesium (mg/dL)  Date Value  65/78/4696 1.8   Calcium (mg/dL)  Date Value  29/52/8413 8.4 (L)   Albumin (g/dL)  Date Value  24/40/1027 3.6   Phosphorus (mg/dL)  Date Value  25/36/6440 4.0   Sodium (mmol/L)  Date Value  04/23/2021 137     Assessment: Patient with gastroparesis d/t uncontrolled diabetes.  - KCl x 3 given on 04/19/2021; K at 3.3>3.3.  Pharmacy consulted for electrolytes management.  K 3.3 ( IV)>3.3 ( IV) > 3.8 >3.3>4.0  Goal of Therapy:  Electrolytes WNL  Plan:  K3.3>4.0 today. No further repletion warranted at this time. Will continue to monitor BMET and adjust treatment as needed.  Martyn Malay, RPH,BCCP 04/23/2021 9:47 AM

## 2021-04-24 DIAGNOSIS — E1143 Type 2 diabetes mellitus with diabetic autonomic (poly)neuropathy: Secondary | ICD-10-CM | POA: Diagnosis not present

## 2021-04-24 DIAGNOSIS — K3184 Gastroparesis: Secondary | ICD-10-CM | POA: Diagnosis not present

## 2021-04-24 LAB — GLUCOSE, CAPILLARY
Glucose-Capillary: 120 mg/dL — ABNORMAL HIGH (ref 70–99)
Glucose-Capillary: 122 mg/dL — ABNORMAL HIGH (ref 70–99)
Glucose-Capillary: 122 mg/dL — ABNORMAL HIGH (ref 70–99)
Glucose-Capillary: 139 mg/dL — ABNORMAL HIGH (ref 70–99)
Glucose-Capillary: 147 mg/dL — ABNORMAL HIGH (ref 70–99)
Glucose-Capillary: 159 mg/dL — ABNORMAL HIGH (ref 70–99)
Glucose-Capillary: 175 mg/dL — ABNORMAL HIGH (ref 70–99)
Glucose-Capillary: 277 mg/dL — ABNORMAL HIGH (ref 70–99)

## 2021-04-24 MED ORDER — ERYTHROMYCIN BASE 250 MG PO TBEC
500.0000 mg | DELAYED_RELEASE_TABLET | Freq: Three times a day (TID) | ORAL | Status: DC
  Administered 2021-04-24 – 2021-04-25 (×3): 500 mg via ORAL
  Filled 2021-04-24 (×5): qty 2

## 2021-04-24 MED ORDER — MORPHINE SULFATE (PF) 2 MG/ML IV SOLN
1.0000 mg | Freq: Four times a day (QID) | INTRAVENOUS | Status: DC | PRN
  Administered 2021-04-24 – 2021-04-25 (×4): 1 mg via INTRAVENOUS
  Filled 2021-04-24 (×4): qty 1

## 2021-04-24 NOTE — TOC Progression Note (Signed)
Transition of Care [facility]) - Progression Note    Patient Details  Name: [name] MRN: [id number] Date of Birth: 1961-05-10  Transition of Care [facility]) CM/SW Contact  Chapman Fitch, RN Phone Number: 04/24/2021, 2:23 PM  Clinical Narrative:     Per MD anticipated DC tomorrow Notified Tanya at [email or web address] Requested repeat covid test   Expected Discharge Plan: Skilled Nursing Facility Barriers to Discharge: Continued Medical Work up  Expected Discharge Plan and Services Expected Discharge Plan: Skilled Nursing Facility In-house Referral: Clinical Social Work   Post Acute Care Choice: Skilled Nursing Facility Living arrangements for the past 2 months: Skilled Nursing Facility                                       Social Determinants of Health (SDOH) Interventions    Readmission Risk Interventions Readmission Risk Prevention Plan 04/20/2021 10/05/2019 03/04/2019  Transportation Screening Complete Complete Complete  PCP or Specialist Appt within 3-5 Days Complete - -  HRI or Home Care Consult Complete - -  Social Work Consult for Recovery Care Planning/Counseling Complete - -  Palliative Care Screening Not Applicable - -  Medication Review [name]) Complete Complete Complete  PCP or Specialist appointment within 3-5 days of discharge - Complete -  HRI or Home Care Consult - Complete -  SW Recovery Care/Counseling Consult - Complete -  Palliative Care Screening - Not Applicable Not Applicable  Skilled Nursing Facility - Complete Complete

## 2021-04-24 NOTE — Progress Notes (Signed)
[facility]  - [place] at [facility]   PATIENT NAME: Eugene [name]    MR#:  [id number]  DATE OF BIRTH:  Mar 19, 1961  SUBJECTIVE:  tolerating broth today. Took his PO meds. No vomiting. No bowel movement yet. Patient has not eaten in many days. At the facility not much active From Bed to the Chair  Wants to try soup REVIEW OF SYSTEMS:   Review of Systems  Constitutional:  Negative for chills, fever and weight loss.  HENT:  Negative for ear discharge, ear pain and nosebleeds.   Eyes:  Negative for blurred vision, pain and discharge.  Respiratory:  Negative for sputum production, shortness of breath, wheezing and stridor.   Cardiovascular:  Negative for chest pain, palpitations, orthopnea and PND.  Gastrointestinal:  Positive for nausea. Negative for abdominal pain, diarrhea and vomiting.  Genitourinary:  Negative for frequency and urgency.  Musculoskeletal:  Negative for back pain and joint pain.  Neurological:  Positive for weakness. Negative for sensory change, speech change and focal weakness.  Psychiatric/Behavioral:  Negative for depression and hallucinations. The patient is not nervous/anxious.   Tolerating Diet: full liquid Tolerating PT: bedbound wheelchair-bound  DRUG ALLERGIES:  No Known Allergies  VITALS:  Blood pressure 131/71, pulse 80, temperature 99.1 F (37.3 C), temperature source Oral, resp. rate 20, height 5' (1.524 m), weight 109 kg, SpO2 99 %.  PHYSICAL EXAMINATION:   Physical Exam  GENERAL:  60 y.o.-year-old patient lying in the bed with no acute distress. Morbidly obese Chronically ill appearing LUNGS: Normal breath sounds bilaterally, no wheezing, rales, rhonchi. No use of accessory muscles of respiration.  CARDIOVASCULAR: S1, S2 normal. No murmurs, rubs, or gallops.  ABDOMEN: Soft, nontender, nondistended. Bowel sounds present. No organomegaly or mass.  EXTREMITIES: bilateral amputation stump  NEUROLOGIC: nonfocal PSYCHIATRIC:   patient is alert and oriented x 3.  SKIN: No obvious rash, lesion, or ulcer--per RN   LABORATORY PANEL:  CBC Recent Labs  Lab 04/23/21 0521  WBC 7.1  HGB 12.8*  HCT 37.4*  PLT 153     Chemistries  Recent Labs  Lab 04/23/21 0521  NA 137  K 4.0  CL 107  CO2 24  GLUCOSE 142*  BUN 11  CREATININE 0.84  CALCIUM 8.4*  MG 1.8    Cardiac Enzymes No results for input(s): TROPONINI in the last 168 hours. RADIOLOGY:  No results found. ASSESSMENT AND PLAN:  [name] is a 60 y.o. male with medical history significant for Type 2 diabetes with gastroparesis, IBS, hepatitis C, anxiety and depression, chronic suprapubic catheter, recurrent fecal impaction who was sent to the ED for evaluation of intractable nausea and vomiting that started earlier in the day.  His symptoms feel similar to prior episodes of gastroparesis.  He has diffuse abdominal pain.   Intractable vomiting secondary to severe diabetic gastroparesis (HCC) --so far, inconsistent improvement in N/V and oral intake --cont IV Reglan QID --IV zofran PRN --back on clear liquid diet --consider GI consult if not improving  -- I have asked patient to consider taking suppository to see if he has a bowel movement. Will advance diet as tolerated --7/29-- continues to vomiting intermittently although trying to take some clear liquid diet. Patient on IV erythromycin -- discussed with patient that his morphine is worsening his bowel movement motility however patient says that's the only thing that helps them with pain. --7/30-- cont IV erythromycin for severe gastroparesis --7/31--no vomiting. RN/CNA to document output accurately.  Pt wants to try FLD --  8/1--no vomiting. Pt wants to cont FLD.   Abdominal pain-- chronic chronic pain syndrome -- po and prn IV pain meds. Decreasing doses  Medication non-compliance --pt intermittently refusing vital checks, scheduled meds and SSI.     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  No signs of infection currently. --Hold abx     HTN (hypertension) --on home hydralazine --IV hydralazine PRN if not able to take orals     DVT prophylaxis: Lovenox SQ Code Status: Full code  Family Communication:  Level of care: Med-Surg Dispo:   The patient is from: SNF/ [facility] Anticipated d/c is to: SNF Anticipated d/c date is? 8/2 Patient currently is medically r slowly improving. Continues to tolerate full liquid diet will discharged back to his facility hopefully tomorrow level of care: Med-Surg Status is: Inpatient  Remains inpatient appropriate because:IV treatments appropriate due to intensity of illness or inability to take PO        TOTAL TIME TAKING CARE OF THIS PATIENT: 23 minutes.  >50% time spent on counselling and coordination of care  Note: This dictation was prepared with Dragon dictation along with smaller phrase technology. Any transcriptional errors that result from this process are unintentional.  Enedina Finner M.D    [facility]   CC: Primary care physician; Abner Greenspan, MD Patient ID: Eugene [name], male   DOB: Feb 21, 1961, 60 y.o.   MRN: [id number]

## 2021-04-25 DIAGNOSIS — K3184 Gastroparesis: Secondary | ICD-10-CM | POA: Diagnosis not present

## 2021-04-25 DIAGNOSIS — E1143 Type 2 diabetes mellitus with diabetic autonomic (poly)neuropathy: Secondary | ICD-10-CM | POA: Diagnosis not present

## 2021-04-25 LAB — CULTURE, BLOOD (ROUTINE X 2)
Culture: NO GROWTH
Culture: NO GROWTH
Special Requests: ADEQUATE

## 2021-04-25 LAB — GLUCOSE, CAPILLARY
Glucose-Capillary: 93 mg/dL (ref 70–99)
Glucose-Capillary: 107 mg/dL — ABNORMAL HIGH (ref 70–99)
Glucose-Capillary: 177 mg/dL — ABNORMAL HIGH (ref 70–99)

## 2021-04-25 LAB — RESP PANEL BY RT-PCR (FLU A&B, COVID) ARPGX2
Influenza A by PCR: NEGATIVE
Influenza B by PCR: NEGATIVE
SARS Coronavirus 2 by RT PCR: NEGATIVE

## 2021-04-25 MED ORDER — OXYCODONE HCL 5 MG PO TABS: 10.0000 mg | ORAL_TABLET | Freq: Three times a day (TID) | ORAL | 0 refills | Status: DC

## 2021-04-25 MED ORDER — INSULIN GLARGINE 100 UNIT/ML ~~LOC~~ SOLN: 25.0000 [IU] | Freq: Every day | SUBCUTANEOUS | 11 refills | Status: DC

## 2021-04-25 MED ORDER — FUROSEMIDE 40 MG PO TABS: 40.0000 mg | ORAL_TABLET | Freq: Every day | ORAL | 0 refills | Status: DC

## 2021-04-25 MED ORDER — FLEET ENEMA 7-19 GM/118ML RE ENEM: 1.0000 | ENEMA | Freq: Every day | RECTAL | Status: DC | PRN

## 2021-04-25 MED ORDER — METOCLOPRAMIDE HCL 10 MG PO TABS
10.0000 mg | ORAL_TABLET | Freq: Three times a day (TID) | ORAL | Status: DC
  Administered 2021-04-25: 10 mg via ORAL
  Filled 2021-04-25: qty 1

## 2021-04-25 MED ORDER — ERYTHROMYCIN BASE 500 MG PO TBEC: 500.0000 mg | DELAYED_RELEASE_TABLET | Freq: Three times a day (TID) | ORAL | 0 refills | Status: DC

## 2021-04-25 NOTE — Progress Notes (Signed)
Patient declined enema.

## 2021-04-25 NOTE — Progress Notes (Signed)
Patient discharged to [facility] via stretcher & report called to [facility], LPN.

## 2021-04-25 NOTE — TOC Transition Note (Signed)
Transition of Care [facility]) - CM/SW Discharge Note   Patient Details  Name: Eugene [name] MRN: [id number] Date of Birth: 1960-10-29  [facility]) CM/SW Contact:  Chapman Fitch, RN Phone Number: 04/25/2021, 11:49 AM   Clinical Narrative:     Patient to discharge back to [facility] today DC info sent in HUB Repeat covid test negative Bedside RN to call report EMS packet on chart EMS transport called  Patient declined any family or friends to be notified of discharge     Barriers to Discharge: Continued Medical Work up   Patient Goals and CMS Choice     Choice offered to / list presented to : Patient  Discharge Placement              Patient chooses bed at: [facility] Patient to be transferred to facility by: EMS      Discharge Plan and Services In-house Referral: Clinical Social Work   Post Acute Care Choice: Skilled Nursing Facility                               Social Determinants of Health (SDOH) Interventions     Readmission Risk Interventions Readmission Risk Prevention Plan 04/25/2021 04/20/2021 10/05/2019  Transportation Screening Complete Complete Complete  PCP or Specialist Appt within 3-5 Days - Complete -  HRI or Home Care Consult - Complete -  Social Work Consult for Recovery Care Planning/Counseling - Complete -  Palliative Care Screening - Not Applicable -  Medication Review [name]) Complete Complete Complete  PCP or Specialist appointment within 3-5 days of discharge Complete - Complete  HRI or Home Care Consult Complete - Complete  SW Recovery Care/Counseling Consult Complete - Complete  Palliative Care Screening Not Applicable - Not Applicable  Skilled Nursing Facility Complete - Complete

## 2021-04-25 NOTE — Discharge Summary (Signed)
[facility] [place] at [facility]   PATIENT NAME: Eugene [name]    MR#:  [id number]  DATE OF BIRTH:  [birth year]  DATE OF ADMISSION:  04/17/2021 ADMITTING PHYSICIAN: Andris Baumann, MD  DATE OF DISCHARGE: 04/25/2021  PRIMARY CARE PHYSICIAN: Abner Greenspan, MD    ADMISSION DIAGNOSIS:  Gastroparesis [K31.84] Generalized abdominal pain [R10.84] Diabetic gastroparesis (HCC) [E11.43, K31.84] Acute cystitis without hematuria [N30.00] Esophagitis [K20.90] Nausea and vomiting, intractability of vomiting not specified, unspecified vomiting type [R11.2] Sepsis, due to unspecified organism, unspecified whether acute organ dysfunction present (HCC) [A41.9]  DISCHARGE DIAGNOSIS:  diabetic gastroparesis  SECONDARY DIAGNOSIS:   Past Medical History:  Diagnosis Date   Anxiety    Depression    Diabetes mellitus without complication (HCC)    Hepatitis C    Hyperlipidemia    IBS (irritable bowel syndrome)    Osteomyelitis (HCC)    Spinal stenosis     HOSPITAL COURSE:  Eugene [name] is a 60 y.o. male with medical history significant for Type 2 diabetes with gastroparesis, IBS, hepatitis C, anxiety and depression, chronic suprapubic catheter, recurrent fecal impaction who was sent to the ED for evaluation of intractable nausea and vomiting that started earlier in the day.  His symptoms feel similar to prior episodes of gastroparesis.  He has diffuse abdominal pain.   Intractable vomiting secondary to severe diabetic gastroparesis (HCC) --IV Reglan QID-- change to oral Reglan Q ID --IV zofran PRN-- overall Zofran --back on clear liquid diet --consider GI consult if not improving  -- I have asked patient to consider taking suppository to see if he has a bowel movement. Will advance diet as tolerated --7/29-- continues to vomiting intermittently although trying to take some clear liquid diet. Patient on IV erythromycin -- discussed with patient that his morphine is worsening  his bowel movement motility however patient says that's the only thing that helps them with pain. --7/30-- cont IV erythromycin for severe gastroparesis --7/31--no vomiting. RN/CNA to document output accurately. Pt wants to try FLD --8/1--no vomiting. Pt wants to cont FLD. --8/2-- tolerating full liquid diet. I've asked patient to remain on full liquid diet for next few days. 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  No signs of infection currently.     HTN (hypertension) --on home hydralazine, metoprolol, amlodipine   overall hemodynamically stable. Slow improvement for gastroparesis. Discharge to his long-term facility.     DVT prophylaxis: Lovenox SQ Code Status: Full code  Family Communication:  Level of care: Med-Surg Dispo:   The patient is from: SNF/ [facility] Anticipated d/c is to: SNF Anticipated d/c date is 8/2    CONSULTS OBTAINED:    DRUG ALLERGIES:  No Known Allergies  DISCHARGE MEDICATIONS:   Allergies as of 04/25/2021   No Known Allergies      Medication List     STOP taking these medications    Ozempic (1 MG/DOSE) 2 MG/1.5ML Sopn Generic drug: Semaglutide (1 MG/DOSE)       TAKE these medications    acetaminophen 325 MG tablet Commonly known as: TYLENOL Take  650 mg by mouth every 4 (four) hours as needed for mild pain or fever.   amLODipine 10 MG tablet Commonly known as: NORVASC Take 10 mg by mouth daily.   bisacodyl 10 MG suppository Commonly known as: DULCOLAX Place 10 mg rectally daily as needed for moderate constipation.    erythromycin 500 MG EC tablet Commonly known as: ERY-TAB Take 1 tablet (500 mg total) by mouth 3 (three) times daily.   escitalopram 10 MG tablet Commonly known as: LEXAPRO Take 5 mg by mouth daily.   famotidine 20 MG tablet Commonly known as: PEPCID Take 20 mg by mouth daily.   ferrous gluconate 324 MG tablet Commonly known as: FERGON Take 324 mg by mouth 2 (two) times daily with a meal.   furosemide 40 MG tablet Commonly known as: LASIX Take 1 tablet (40 mg total) by mouth daily. What changed: when to take this   hydrALAZINE 50 MG tablet Commonly known as: APRESOLINE Take 50 mg by mouth 3 (three) times daily.   insulin glargine 100 UNIT/ML injection Commonly known as: LANTUS Inject 0.25 mLs (25 Units total) into the skin daily. What changed:  how much to take when to take this   insulin lispro 100 UNIT/ML injection Commonly known as: HUMALOG Inject 0-15 Units into the skin 4 (four) times daily -  before meals and at bedtime. <150= 0 units 151-200= 3 units 201-250= 6 units 251-300= 9 units 301-350= 12 units 351-400= 15 units >400 call MD   linaclotide 145 MCG Caps capsule Commonly known as: LINZESS Take 1 capsule (145 mcg total) by mouth daily before breakfast.   loratadine 10 MG tablet Commonly known as: CLARITIN Take 10 mg by mouth daily.   melatonin 5 MG Tabs Take 5 mg by mouth at bedtime.   methocarbamol 500 MG tablet Commonly known as: ROBAXIN Take 1,000 mg by mouth 4 (four) times daily.   metoCLOPramide 10 MG tablet Commonly known as: REGLAN Take 1 tablet (10 mg total) by mouth 4 (four) times daily -  before meals and at bedtime.   metoprolol succinate 25 MG 24 hr tablet Commonly known as: TOPROL-XL Take 25 mg by mouth daily.   nicotine 21 mg/24hr patch Commonly known as: NICODERM CQ - dosed in mg/24 hours Place 21 mg onto the skin daily.   ondansetron 4 MG tablet Commonly known as: ZOFRAN Take 4 mg by mouth every 6 (six) hours as needed  for nausea or vomiting.   oxyCODONE 5 MG immediate release tablet Commonly known as: Oxy IR/ROXICODONE Take 10 mg by mouth every 8 (eight) hours. What changed: Another medication with the same name was removed. Continue taking this medication, and follow the directions you see here.   pantoprazole 40 MG tablet Commonly known as: PROTONIX Take 40 mg by mouth at bedtime. What changed: Another medication with the same name was removed. Continue taking this medication, and follow the directions you see here.   polyethylene glycol 17 g packet Commonly known as: MIRALAX / GLYCOLAX Take 17 g by mouth 2 (two) times daily.   senna-docusate 8.6-50 MG tablet Commonly known as: Senokot-S Take 1 tablet by mouth 2 (two) times daily.   sodium phosphate Pediatric 3.5-9.5 GM/59ML enema Place 1 enema rectally every other day as needed for severe constipation.   traZODone 50 MG tablet Commonly known as: DESYREL Take 50 mg by mouth at bedtime.        If you experience worsening of your admission symptoms, develop shortness of breath,  life threatening emergency, suicidal or homicidal thoughts you must seek medical attention immediately by calling 911 or calling your MD immediately  if symptoms less severe.  You Must read complete instructions/literature along with all the possible adverse reactions/side effects for all the Medicines you take and that have been prescribed to you. Take any new Medicines after you have completely understood and accept all the possible adverse reactions/side effects.   Please note  You were cared for by a hospitalist during your hospital stay. If you have any questions about your discharge medications or the care you received while you were in the hospital after you are discharged, you can call the unit and asked to speak with the hospitalist on call if the hospitalist that took care of you is not available. Once you are discharged, your primary care physician will  handle any further medical issues. Please note that NO REFILLS for any discharge medications will be authorized once you are discharged, as it is imperative that you return to your primary care physician (or establish a relationship with a primary care physician if you do not have one) for your aftercare needs so that they can reassess your need for medications and monitor your lab values. Today   SUBJECTIVE   wants to have enema. Tolerating full liquid diet.  VITAL SIGNS:  Blood pressure 128/66, pulse 66, temperature 97.6 F (36.4 C), temperature source Oral, resp. rate 18, height 5' (1.524 m), weight 109 kg, SpO2 94 %.  I/O:   Intake/Output Summary (Last 24 hours) at 04/25/2021 0948 Last data filed at 04/25/2021 0553 Gross per 24 hour  Intake 0 ml  Output 150 ml  Net -150 ml    PHYSICAL EXAMINATION:  GENERAL:  60 y.o.-year-old patient lying in the bed with no acute distress. Morbidly obese Chronically ill appearing LUNGS: Normal breath sounds bilaterally, no wheezing, rales, rhonchi. No use of accessory muscles of respiration. CARDIOVASCULAR: S1, S2 normal. No murmurs, rubs, or gallops. ABDOMEN: Soft, nontender, nondistended. Bowel sounds present. No organomegaly or mass. EXTREMITIES: bilateral amputation stump NEUROLOGIC: nonfocal PSYCHIATRIC:  patient is alert and oriented x 3. SKIN: No obvious rash, lesion, or ulcer--per RN DATA REVIEW:   CBC  Recent Labs  Lab 04/23/21 0521  WBC 7.1  HGB 12.8*  HCT 37.4*  PLT 153    Chemistries  Recent Labs  Lab 04/23/21 0521  NA 137  K 4.0  CL 107  CO2 24  GLUCOSE 142*  BUN 11  CREATININE 0.84  CALCIUM 8.4*  MG 1.8    Microbiology Results   Recent Results (from the past 240 hour(s))  Urine Culture     Status: Abnormal   Collection Time: 04/16/21  9:28 PM   Specimen: Urine, Random  Result Value Ref Range Status   Specimen Description   Final    URINE, RANDOM Performed at [facility], [address],  [place], [place] [address]    Special Requests   Final    NONE Performed at [facility], [address] [place], [place] [address]    Culture 80,000 COLONIES/mL PROVIDENCIA RETTGERI (A)  Final   Report Status 04/19/2021 FINAL  Final   Organism ID, Bacteria PROVIDENCIA RETTGERI (A)  Final      Susceptibility   Providencia rettgeri - MIC*    AMPICILLIN RESISTANT Resistant     CEFAZOLIN RESISTANT Resistant     CEFEPIME <=0.12 SENSITIVE Sensitive     CEFTRIAXONE <=0.25 SENSITIVE Sensitive     CIPROFLOXACIN <=  0.25 SENSITIVE Sensitive     GENTAMICIN <=1 SENSITIVE Sensitive     IMIPENEM 4 SENSITIVE Sensitive     NITROFURANTOIN 128 RESISTANT Resistant     TRIMETH/SULFA <=20 SENSITIVE Sensitive     AMPICILLIN/SULBACTAM <=2 SENSITIVE Sensitive     PIP/TAZO <=4 SENSITIVE Sensitive     * 80,000 COLONIES/mL PROVIDENCIA RETTGERI  Resp Panel by RT-PCR (Flu A&B, Covid) Nasopharyngeal Swab     Status: None   Collection Time: 04/17/21  2:15 AM   Specimen: Nasopharyngeal Swab; Nasopharyngeal(NP) swabs in vial transport medium  Result Value Ref Range Status   SARS Coronavirus 2 by RT PCR NEGATIVE NEGATIVE Final    Comment: (NOTE) SARS-CoV-2 target nucleic acids are NOT DETECTED.  The SARS-CoV-2 RNA is generally detectable in upper respiratory specimens during the acute phase of infection. The lowest concentration of SARS-CoV-2 viral copies this assay can detect is 138 copies/mL. A negative result does not preclude SARS-Cov-2 infection and should not be used as the sole basis for treatment or other patient management decisions. A negative result may occur with  improper specimen collection/handling, submission of specimen other than nasopharyngeal swab, presence of viral mutation(s) within the areas targeted by this assay, and inadequate number of viral copies(<138 copies/mL). A negative result must be combined with clinical observations, patient history, and epidemiological information.  The expected result is Negative.  Fact Sheet for Patients:  BloggerCourse.com  Fact Sheet for Healthcare Providers:  SeriousBroker.it  This test is no t yet approved or cleared by the [place] FDA and  has been authorized for detection and/or diagnosis of SARS-CoV-2 by FDA under an Emergency Use Authorization (EUA). This EUA will remain  in effect (meaning this test can be used) for the duration of the COVID-19 declaration under Section 564(b)(1) of the Act, 21 U.S.C.section 360bbb-3(b)(1), unless the authorization is terminated  or revoked sooner.       Influenza A by PCR NEGATIVE NEGATIVE Final   Influenza B by PCR NEGATIVE NEGATIVE Final    Comment: (NOTE) The Xpert Xpress SARS-CoV-2/FLU/RSV plus assay is intended as an aid in the diagnosis of influenza from Nasopharyngeal swab specimens and should not be used as a sole basis for treatment. Nasal washings and aspirates are unacceptable for Xpert Xpress SARS-CoV-2/FLU/RSV testing.  Fact Sheet for Patients: BloggerCourse.com  Fact Sheet for Healthcare Providers: SeriousBroker.it  This test is not yet approved or cleared by the [place] FDA and has been authorized for detection and/or diagnosis of SARS-CoV-2 by FDA under an Emergency Use Authorization (EUA). This EUA will remain in effect (meaning this test can be used) for the duration of the COVID-19 declaration under Section 564(b)(1) of the Act, 21 U.S.C. section 360bbb-3(b)(1), unless the authorization is terminated or revoked.  Performed at [facility], [address] [place], [place] [address]   Culture, blood (routine x 2)     Status: None   Collection Time: 04/17/21  7:29 AM   Specimen: BLOOD RIGHT HAND  Result Value Ref Range Status   Specimen Description BLOOD RIGHT HAND  Final   Special Requests   Final    BOTTLES DRAWN AEROBIC ONLY  Blood Culture results may not be optimal due to an inadequate volume of blood received in culture bottles   Culture   Final    NO GROWTH 8 DAYS Performed at [facility], [address] [place], [place] [address]    Report Status 04/25/2021 FINAL  Final  Culture, blood (routine x 2)  Status: None   Collection Time: 04/17/21  7:31 AM   Specimen: BLOOD  Result Value Ref Range Status   Specimen Description BLOOD BLOOD LEFT HAND  Final   Special Requests   Final    BOTTLES DRAWN AEROBIC ONLY Blood Culture adequate volume   Culture   Final    NO GROWTH 8 DAYS Performed at [facility], [address] [place], [place] [address]    Report Status 04/25/2021 FINAL  Final  Resp Panel by RT-PCR (Flu A&B, Covid) Nasopharyngeal Swab     Status: None   Collection Time: 04/24/21  5:08 PM   Specimen: Nasopharyngeal Swab; Nasopharyngeal(NP) swabs in vial transport medium  Result Value Ref Range Status   SARS Coronavirus 2 by RT PCR NEGATIVE NEGATIVE Final    Comment: (NOTE) SARS-CoV-2 target nucleic acids are NOT DETECTED.  The SARS-CoV-2 RNA is generally detectable in upper respiratory specimens during the acute phase of infection. The lowest concentration of SARS-CoV-2 viral copies this assay can detect is 138 copies/mL. A negative result does not preclude SARS-Cov-2 infection and should not be used as the sole basis for treatment or other patient management decisions. A negative result may occur with  improper specimen collection/handling, submission of specimen other than nasopharyngeal swab, presence of viral mutation(s) within the areas targeted by this assay, and inadequate number of viral copies(<138 copies/mL). A negative result must be combined with clinical observations, patient history, and epidemiological information. The expected result is Negative.  Fact Sheet for Patients:  BloggerCourse.comhttps://www.fda.gov/media/152166/download  Fact Sheet for Healthcare  Providers:  SeriousBroker.ithttps://www.fda.gov/media/152162/download  This test is no t yet approved or cleared by the [place] FDA and  has been authorized for detection and/or diagnosis of SARS-CoV-2 by FDA under an Emergency Use Authorization (EUA). This EUA will remain  in effect (meaning this test can be used) for the duration of the COVID-19 declaration under Section 564(b)(1) of the Act, 21 U.S.C.section 360bbb-3(b)(1), unless the authorization is terminated  or revoked sooner.       Influenza A by PCR NEGATIVE NEGATIVE Final   Influenza B by PCR NEGATIVE NEGATIVE Final    Comment: (NOTE) The Xpert Xpress SARS-CoV-2/FLU/RSV plus assay is intended as an aid in the diagnosis of influenza from Nasopharyngeal swab specimens and should not be used as a sole basis for treatment. Nasal washings and aspirates are unacceptable for Xpert Xpress SARS-CoV-2/FLU/RSV testing.  Fact Sheet for Patients: BloggerCourse.comhttps://www.fda.gov/media/152166/download  Fact Sheet for Healthcare Providers: SeriousBroker.ithttps://www.fda.gov/media/152162/download  This test is not yet approved or cleared by the [place] FDA and has been authorized for detection and/or diagnosis of SARS-CoV-2 by FDA under an Emergency Use Authorization (EUA). This EUA will remain in effect (meaning this test can be used) for the duration of the COVID-19 declaration under Section 564(b)(1) of the Act, 21 U.S.C. section 360bbb-3(b)(1), unless the authorization is terminated or revoked.  Performed at [facility], [address] [place], [place] [address]     RADIOLOGY:  No results found.   CODE STATUS:     Code Status Orders  (From admission, onward)           Start     Ordered   04/17/21 0411  Full code  Continuous        04/17/21 0412           Code Status History     Date Active Date Inactive Code Status Order ID Comments User Context   12/03/2019 0457 12/14/2019 2331 Full Code  [id number]  Mansy, Vernetta Honey, MD ED    10/21/2019 1935 10/24/2019 2342 Full Code [id number]  Pearson Grippe, MD ED   10/02/2019 0909 10/08/2019 0215 Full Code [id number]  Lurene Shadow, MD ED   02/26/2019 0412 03/04/2019 1946 Full Code [id number]  Mansy, Vernetta Honey, MD Inpatient   02/07/2019 0234 02/10/2019 1819 Full Code [id number]  Oralia Manis, MD Inpatient        TOTAL TIME TAKING CARE OF THIS PATIENT: 40 minutes.    Enedina Finner M.D  [facility]    CC: Primary care physician; Abner Greenspan, MD

## 2021-04-25 NOTE — Discharge Instructions (Signed)
Patient to continue full liquid diet for next few days and advance to soft as tolerated

## 2021-07-31 ENCOUNTER — Non-Acute Institutional Stay: Payer: Medicaid Other | Admitting: Nurse Practitioner

## 2021-07-31 ENCOUNTER — Encounter: Payer: Self-pay | Admitting: Nurse Practitioner

## 2021-07-31 VITALS — BP 132/78 | HR 78 | Temp 98.0°F | Resp 18 | Wt 273.3 lb

## 2021-07-31 DIAGNOSIS — Z515 Encounter for palliative care: Secondary | ICD-10-CM

## 2021-07-31 DIAGNOSIS — R531 Weakness: Secondary | ICD-10-CM

## 2021-07-31 DIAGNOSIS — R110 Nausea: Secondary | ICD-10-CM

## 2021-07-31 DIAGNOSIS — M549 Dorsalgia, unspecified: Secondary | ICD-10-CM

## 2021-07-31 DIAGNOSIS — G8929 Other chronic pain: Secondary | ICD-10-CM

## 2021-07-31 DIAGNOSIS — K3184 Gastroparesis: Secondary | ICD-10-CM

## 2021-07-31 DIAGNOSIS — E1143 Type 2 diabetes mellitus with diabetic autonomic (poly)neuropathy: Secondary | ICD-10-CM

## 2021-07-31 NOTE — Progress Notes (Addendum)
[facility] Consult Note Telephone: [phone]  Fax: [phone]   Date of encounter: 07/31/21 8:49 PM PATIENT NAME: Eugene [name] [id number]   [phone] (home)  DOB: 1961/06/08 MRN: [id number] PRIMARY CARE PROVIDER:    Dr [facility]  RESPONSIBLE PARTY:    Contact Information     Name Relation Home Work [place], [place] Brother [phone]  [phone]      I met face to face with patient in facility. Palliative Care was asked to follow this patient by consultation request of  Dr Nicole Kindred to address advance care planning and complex medical decision making. This is the initial visit.          ASSESSMENT AND PLAN / RECOMMENDATIONS:  Symptom Management/Plan: 1. Advance Care Planning; DNR, will continue discussions goc; Eugene [name] declined to nursing wishes for [facility] consult, after talking with Eugene [name] he was agreeable to [facility] consult and following. He misunderstood purpose he thought it was hospice  2. Chronic back pain; continue with current regimen with lyrica; oxycodone; Eugene [name] was open to trying physical therapy for pain modality, will recommend to primary  3. Generalized weakness secondary to decompensation with recent hospitalization. Will encourage therapy, oob, mobility for strengthening, self independence.   4. Chronic nausea secondary to gastroparesis with DM; continue with zofran, monitor symptoms, ppi, dietician consult recommended   5. Goals of Care: Goals include to maximize quality of life and symptom management. Our advance care planning conversation included a discussion about:    The value and importance of advance care planning  Exploration of personal, cultural or spiritual beliefs that might influence medical decisions  Exploration of goals of care in the event of a sudden injury or illness  Identification and preparation of a healthcare agent   Review and updating or creation of an advance directive document.  6. Palliative care encounter; Palliative care encounter; Palliative medicine team will continue to support patient, patient's family, and medical team. Visit consisted of counseling and education dealing with the complex and emotionally intense issues of symptom management and palliative care in the setting of serious and potentially life-threatening illness  7. f/u 2 weeks  for ongoing monitoring chronic disease progression, ongoing discussions complex medical decision making  Follow up Palliative Care Visit: Palliative care will continue to follow for complex medical decision making, advance care planning, and clarification of goals. Return 2 weeks or prn.  I spent 48 minutes providing this consultation. More than 50% of the time in this consultation was spent in counseling and care coordination. PPS: 40%  Chief Complaint: Initial palliative consult for complex medical decision making  HISTORY OF PRESENT ILLNESS:  Eugene [name] is a 60 y.o. year old male  with medical history significant for Type 2 diabetes with gastroparesis, IBS, hepatitis C, anxiety and depression, chronic suprapubic catheter. Hospitalized 04/17/2021 to 04/25/2021 for gastroparesis, generalized abdominal pain, nauseas, vomiting, sepsis. Workup significant for Intractable vomiting secondary to severe diabetic gastroparesis, chronic pain syndrome currently on oxycodone, DM, UTI with chronic suprapubic catheter. Eugene [name] was d/c to SNF at [facility] where he currently resides. Eugene [name] requires assistance for mobility, transfers, sits in w/c. He does go to smoking patio to smoke daily. He is able to feed himself with current weight  273.3 lbs with bmi 51.6 with 3.3 lbs weigh gain over 3 months.  07/13/2021 Last psychiatry visit 07/13/2021 for recurrent major depressive episode currently prescribed lexapro. Anxiety  chronic and stable on Buspirone.  Insomnia currently takes Trazodone and melatonin. No changes on this visit. Primary provider note 07/27/2021 Blood sugars continue to run elevated on sliding scale, non compliant. Gastroparesis on pantoprazole with from famotidine. I visited and observed Eugene [name]. Eugene [name] and I talked purpose of PC visit, Eugene [name] in agreement. We talked about how he has been feeling, past medical history, chronic disease progression of DM, recent hospitalization, symptoms including ros, nausea/vomiting mostly in the am. We talked at length about impact of smoking on dm, gastroparesis. We talked about appetite, diet, food choices, anti medic. We talked about medical goals. We talked about symptoms of pain back at length with recommendation for physical therapy for pain modality, Eugene [name] in agreement. We talked about quality of life, role pc in poc, life review, family, social hx.  Discussed will f/u visit, updated nursing. Therapeutic listening, emotional support provided. Questions answered.   07/31/2021 wbc 6.3. Hgb 12.8, hct 43.9, platelets 148  History obtained from review of EMR, discussion with facility staff and Eugene [name].  I reviewed available labs, medications, imaging, studies and related documents from the EMR.  Records reviewed and summarized above.   ROS Full 10 system review of systems performed and negative with exception of: as per HPI.   Physical Exam: Constitutional: NAD General: obese, pleasant male EYES: lids intact ENMT: oral mucous membranes moist CV: S1S2, RRR Pulmonary: Decrease bases, no increased work of breathing, no cough Abdomen: normo-active BS + 4 quadrants, soft and non tender MSK: w/c Skin: warm and dry Neuro:  + generalized weakness,  no cognitive impairment Psych: non-anxious affect, A and O x 3  CURRENT PROBLEM LIST:  Patient Active Problem List   Diagnosis Date Noted   Diabetic gastroparesis ([place]) 04/17/2021   Suprapubic catheter dysfunction (HCC)    Pain     Constipation by delayed colonic transit    Nausea and vomiting 10/21/2019   Suprapubic catheter ([place])    Sepsis ([place]) 10/02/2019   Thoracic spinal stenosis 02/12/2019   Osteomyelitis of thoracic spine ([place]) 02/08/2019   Peripheral vascular disease ([place]) 02/08/2019   HTN (hypertension) 02/08/2019   Dyslipidemia 02/08/2019   Chronic pain syndrome 02/08/2019   Polysubstance abuse ([place]) 02/08/2019   Chronic hepatitis C without hepatic coma ([place]) 02/08/2019   GERD (gastroesophageal reflux disease) 02/08/2019   Cigarette smoker 02/08/2019   Normocytic anemia 02/08/2019   UTI (urinary tract infection) 02/07/2019   AKI (acute kidney injury) ([place]) 02/07/2019   Hyperkalemia 02/07/2019   Pressure injury of skin 02/07/2019   Hyperlipidemia 11/07/2018   Drug-seeking behavior 11/07/2018   Abnormality of gait 10/17/2018   Hx of right BKA ([place]) 10/17/2018   Abdominal pain 03/24/2018   Acute pain of left knee 01/23/2018   MRSA (methicillin resistant Staphylococcus aureus) septicemia ([place]) 08/17/2017   Bacteremia due to Staphylococcus aureus 08/15/2017   Other acute osteomyelitis, left ankle and foot ([place]) 06/29/2017   Diabetic ulcer of right foot associated with diabetes mellitus due to underlying condition ([place]) 06/27/2017   Cellulitis of left foot 06/27/2017   Tobacco use disorder 06/27/2017   Type 2 diabetes mellitus with hyperglycemia, with long-term current use of insulin ([place]) 06/18/2017   PAST MEDICAL HISTORY:  Active Ambulatory Problems    Diagnosis Date Noted   UTI (urinary tract infection) 02/07/2019   AKI (acute kidney injury) ([place]) 02/07/2019   Hyperkalemia 02/07/2019   Type 2 diabetes mellitus with hyperglycemia, with long-term current use of insulin ([place]) 06/18/2017  Pressure injury of skin 02/07/2019   Bacteremia due to Staphylococcus aureus 08/15/2017   Osteomyelitis of thoracic spine ([place]) 02/08/2019   Peripheral vascular disease ([place]) 02/08/2019   HTN (hypertension)  02/08/2019   Dyslipidemia 02/08/2019   Chronic pain syndrome 02/08/2019   Polysubstance abuse ([place]) 02/08/2019   Chronic hepatitis C without hepatic coma ([place]) 02/08/2019   GERD (gastroesophageal reflux disease) 02/08/2019   Cigarette smoker 02/08/2019   Normocytic anemia 02/08/2019   Other acute osteomyelitis, left ankle and foot ([place]) 06/29/2017   MRSA (methicillin resistant Staphylococcus aureus) septicemia ([place]) 08/17/2017   Hyperlipidemia 11/07/2018   Diabetic ulcer of right foot associated with diabetes mellitus due to underlying condition ([place]) 06/27/2017   Sepsis ([place]) 10/02/2019   Suprapubic catheter (HCC)    Nausea and vomiting 10/21/2019   Suprapubic catheter dysfunction (HCC)    Pain    Constipation by delayed colonic transit    Abdominal pain 03/24/2018   Abnormality of gait 10/17/2018   Acute pain of left knee 01/23/2018   Cellulitis of left foot 06/27/2017   Drug-seeking behavior 11/07/2018   Hx of right BKA ([place]) 10/17/2018   Thoracic spinal stenosis 02/12/2019   Tobacco use disorder 06/27/2017   Diabetic gastroparesis ([place]) 04/17/2021   Resolved Ambulatory Problems    Diagnosis Date Noted   No Resolved Ambulatory Problems   Past Medical History:  Diagnosis Date   Anxiety    Depression    Diabetes mellitus without complication ([place])    Hepatitis C    IBS (irritable bowel syndrome)    Osteomyelitis ([place])    Spinal stenosis    SOCIAL HX:  Social History   Tobacco Use   Smoking status: Every Day    Packs/day: 0.25    Types: Cigarettes   Smokeless tobacco: Never  Substance Use Topics   Alcohol use: Not Currently   FAMILY HX: History reviewed. No pertinent family history.  reviewed  ALLERGIES: No Known Allergies   PERTINENT MEDICATIONS:  Outpatient Encounter Medications as of 07/31/2021  Medication Sig   acetaminophen (TYLENOL) 325 MG tablet Take 650 mg by mouth every 4 (four) hours as needed for mild pain or fever.   amLODipine (NORVASC) 10 MG  tablet Take 10 mg by mouth daily.   bisacodyl (DULCOLAX) 10 MG suppository Place 10 mg rectally daily as needed for moderate constipation.   erythromycin (ERY-TAB) 500 MG EC tablet Take 1 tablet (500 mg total) by mouth 3 (three) times daily.   escitalopram (LEXAPRO) 10 MG tablet Take 5 mg by mouth daily.   famotidine (PEPCID) 20 MG tablet Take 20 mg by mouth daily.    ferrous gluconate (FERGON) 324 MG tablet Take 324 mg by mouth 2 (two) times daily with a meal.    furosemide (LASIX) 40 MG tablet Take 1 tablet (40 mg total) by mouth daily.   hydrALAZINE (APRESOLINE) 50 MG tablet Take 50 mg by mouth 3 (three) times daily.   insulin glargine (LANTUS) 100 UNIT/ML injection Inject 0.25 mLs (25 Units total) into the skin daily.   insulin lispro (HUMALOG) 100 UNIT/ML injection Inject 0-15 Units into the skin 4 (four) times daily -  before meals and at bedtime. <150= 0 units 151-200= 3 units 201-250= 6 units 251-300= 9 units 301-350= 12 units 351-400= 15 units >400 call MD   linaclotide (LINZESS) 145 MCG CAPS capsule Take 1 capsule (145 mcg total) by mouth daily before breakfast.   loratadine (CLARITIN) 10 MG tablet Take 10 mg by mouth daily.  Melatonin 5 MG TABS Take 5 mg by mouth at bedtime.   methocarbamol (ROBAXIN) 500 MG tablet Take 1,000 mg by mouth 4 (four) times daily.    metoCLOPramide (REGLAN) 10 MG tablet Take 1 tablet (10 mg total) by mouth 4 (four) times daily -  before meals and at bedtime.   metoprolol succinate (TOPROL-XL) 25 MG 24 hr tablet Take 25 mg by mouth daily.   nicotine (NICODERM CQ - DOSED IN MG/24 HOURS) 21 mg/24hr patch Place 21 mg onto the skin daily.   ondansetron (ZOFRAN) 4 MG tablet Take 4 mg by mouth every 6 (six) hours as needed for nausea or vomiting.   oxyCODONE (OXY IR/ROXICODONE) 5 MG immediate release tablet Take 2 tablets (10 mg total) by mouth every 8 (eight) hours.   pantoprazole (PROTONIX) 40 MG tablet Take 40 mg by mouth at bedtime.   polyethylene glycol  (MIRALAX / GLYCOLAX) 17 g packet Take 17 g by mouth 2 (two) times daily.   senna-docusate (SENOKOT-S) 8.6-50 MG tablet Take 1 tablet by mouth 2 (two) times daily.   sodium phosphate Pediatric (FLEET) 3.5-9.5 GM/59ML enema Place 1 enema rectally every other day as needed for severe constipation.   traZODone (DESYREL) 50 MG tablet Take 50 mg by mouth at bedtime.    [DISCONTINUED] pregabalin (LYRICA) 75 MG capsule Take 75 mg by mouth 2 (two) times daily. (Patient not taking: Reported on 04/17/2021)   No facility-administered encounter medications on file as of 07/31/2021.  Questions and concerns were addressed. Provided general support and encouragement, no other unmet needs identified   Thank you for the opportunity to participate in the care of Eugene [name].  The palliative care team will continue to follow. Please call our office at [phone] if we can be of additional assistance.   This chart was dictated using voice recognition software.  Despite best efforts to proofread,  errors can occur which can change the documentation meaning.   Brehanna Deveny Z Robie Oats, NP ,

## 2021-08-01 ENCOUNTER — Other Ambulatory Visit: Payer: Self-pay

## 2021-08-14 NOTE — Progress Notes (Deleted)
Suprapubic Cath Change  Patient is present today for a suprapubic catheter change due to urinary retention.  8 ml of water was drained from the balloon, a 16 FR foley cath was removed from the tract with out difficulty.  Site was cleaned and prepped in a sterile fashion with betadine.  A 16 FR foley cath was replaced into the tract no complications were noted. Urine return was noted, 10 ml of sterile water was inflated into the balloon and a leg bag was attached for drainage.  Patient tolerated well.   Performed by: Quiera Diffee, PA-C   Follow up: One month for SPT exchange  

## 2021-08-15 ENCOUNTER — Ambulatory Visit: Payer: Medicaid Other | Admitting: Urology

## 2021-08-15 DIAGNOSIS — Z435 Encounter for attention to cystostomy: Secondary | ICD-10-CM

## 2021-08-21 ENCOUNTER — Encounter: Payer: Self-pay | Admitting: Nurse Practitioner

## 2021-08-21 ENCOUNTER — Other Ambulatory Visit: Payer: Self-pay

## 2021-08-21 ENCOUNTER — Non-Acute Institutional Stay: Payer: Medicaid Other | Admitting: Nurse Practitioner

## 2021-08-21 VITALS — BP 128/80 | HR 88 | Temp 98.1°F | Resp 20 | Wt 273.2 lb

## 2021-08-21 DIAGNOSIS — Z515 Encounter for palliative care: Secondary | ICD-10-CM

## 2021-08-21 DIAGNOSIS — R531 Weakness: Secondary | ICD-10-CM

## 2021-08-21 DIAGNOSIS — E1143 Type 2 diabetes mellitus with diabetic autonomic (poly)neuropathy: Secondary | ICD-10-CM

## 2021-08-21 DIAGNOSIS — G8929 Other chronic pain: Secondary | ICD-10-CM

## 2021-08-21 NOTE — Progress Notes (Signed)
[facility] Consult Note Telephone: [phone]  Fax: [phone]    Date of encounter: 08/21/21 9:17 PM PATIENT NAME: Eugene [name] [id number]   [phone] (home)  DOB: 1960/10/06 MRN: [id number] PRIMARY CARE PROVIDER:    [place]:    Contact Information     Name Relation Home Work [address]   Eugene [name], Eugene [name] Brother [phone]  [phone]      I met face to face with patient in facility. Palliative Care was asked to follow this patient by consultation request of  [place] to address advance care planning and complex medical decision making. This is a follow up visit.                                  ASSESSMENT AND PLAN / RECOMMENDATIONS:  Symptom Management/Plan: 1. Advance Care Planning; DNR, Discussed quality of life, challenges of facility, therapeutic listening, emotional support provided.    2. Chronic back pain; continue with current regimen with lyrica; oxycodone; Eugene [name] was open to trying physical therapy for pain modality, will recommend to primary   3. Generalized weakness secondary to decompensation with recent hospitalization. Will encourage therapy, oob, mobility for strengthening, self independence. Agree to continue with prosthetics, then wishes to start therapy.    4. DM uncontrolled with HAIC 10.3. Discussed at length uncontrolled DM, insulin vs oral agents. We talked about importance of compliance. Explained about insulin with current clinical condition. We talked about diet, compliance. We talked about option of Endocrinology referral for further exploration of DM management.   07/31/2021, WBC 6.3, hemoglobin 12.8, hematocrit 43.9, platelets 148, sodium 133, potassium 5.3, chloride 101, Co 218, calcium 9.0, BUN 18.7, creatinine 1.07, glucose 383, total protein 6.3, albumin 3.2, ALT 27, AST 24  08/10/2021 HAIC  10.3  08/11/2021 TSH 12.13, PSA 0.31  Chronic nausea secondary to gastroparesis with DM; continue with zofran, monitor symptoms, ppi, dietician consult recommended. We talked about constipation, may consider senokot plus 2 tablets qd. May even benefit from reglan though concern for interactions. Will need further discussion.    5. Palliative care encounter; Palliative care encounter; Palliative medicine team will continue to support patient, patient's family, and medical team. Visit consisted of counseling and education dealing with the complex and emotionally intense issues of symptom management and palliative care in the setting of serious and potentially life-threatening illness   6. f/u 4 weeks  for ongoing monitoring chronic disease progression, ongoing discussions complex medical decision making  Follow up Palliative Care Visit: Palliative care will continue to follow for complex medical decision making, advance care planning, and clarification of goals. Return 4 weeks or prn.  I spent 38 minutes providing this consultation. More than 50% of the time in this consultation was spent in counseling and care coordination. PPS: 50%  Chief Complaint: Follow up palliative consult for complex medical decision making  HISTORY OF PRESENT ILLNESS:  Eugene [name] is a 60 y.o. year old male  with medical history significant for Type 2 diabetes with gastroparesis, IBS, hepatitis C, anxiety and depression, chronic suprapubic catheter. Hospitalized 04/17/2021 to 04/25/2021 for gastroparesis, generalized abdominal pain, nauseas, vomiting, sepsis. Workup significant for Intractable vomiting secondary to severe diabetic gastroparesis, chronic pain syndrome currently on oxycodone, DM, UTI with chronic suprapubic catheter. Eugene [name] was d/c to SNF at [facility] where he currently resides. Eugene [name]  requires assistance for mobility, transfers, sits in w/c. He does go to smoking patio to smoke daily. He is  able to feed himself with current weight  273.3 lbs with bmi 51.6 with 3.3 lbs weigh gain over 3 months.  07/13/2021 Last psychiatry visit 07/13/2021 for recurrent major depressive episode currently prescribed lexapro. Anxiety chronic and stable on Buspirone. Insomnia currently takes Trazodone and melatonin. No changes on this visit. Primary provider note 07/27/2021 Blood sugars continue to run elevated on sliding scale, non compliant. Gastroparesis on pantoprazole with from famotidine. I visited and observed Eugene [name]. Eugene [name] and I talked purpose of PC visit, Eugene [name] in agreement.   History obtained from review of EMR, discussion with facility staff, NP and Eugene [name].  I reviewed available labs, medications, imaging, studies and related documents from the EMR.  Records reviewed and summarized above.   ROS Full 10 system review of systems performed and negative with exception of: as per HPI.   Physical Exam: Constitutional: NAD General: debilitated, male EYES: lids intact ENMT: oral mucous membranes moist CV: S1S2, RRR Pulmonary: LCTA, no increased work of breathing, no cough Abdomen: soft and non tender MSK: w/c dependent Skin: warm and dry Neuro:  + generalized weakness,  no cognitive impairment Psych: non-anxious affect, A and O x 3  Questions and concerns were addressed.  Provided general support and encouragement, no other unmet needs identified   Thank you for the opportunity to participate in the care of Eugene [name].  The palliative care team will continue to follow. Please call our office at [phone] if we can be of additional assistance.   This chart was dictated using voice recognition software.  Despite best efforts to proofread,  errors can occur which can change the documentation meaning.   Eugene Deal Ihor Gully, NP

## 2021-08-28 ENCOUNTER — Telehealth: Payer: Self-pay | Admitting: Urology

## 2021-08-28 NOTE — Telephone Encounter (Signed)
Spoke with nurse and she will consult with patient to see if he will come in. Nurse will call back.

## 2021-08-28 NOTE — Telephone Encounter (Signed)
Eugene Battiest, PA-C  You Just now (4:06 PM)   I was told he refused his appointment on 08/15/2021 and that is why the facility changed the catheter.  He is most likely having dependent leakage from the penis rather than actually voiding on his own.  This sometimes occurs with patients who experience weight gain, the suprapubic catheter becoming clogged with sediment/blood clots/mucus/etc or if there are high volume pressures in the bladder.  In order to see if patient is urinating on his own, we would need to conduct voiding trials and urodynamic studies which would require [name] being exceedingly compliant with instruction and showing up for appointments.  He has not demonstrated this in the past.

## 2021-08-28 NOTE — Telephone Encounter (Signed)
[facility] Manager Victorino Dike )called to let our office know this pt has a catheter but he has started urinating a little bit on his own. She wants to know if the provider would like to see the pt. In office? He cancelled his November appointment with our office because [facility] Changed his catheter for him.  He will be due for a catheter change again by 09/14/21. Please call Victorino Dike at [phone] to advise.

## 2021-08-29 NOTE — Telephone Encounter (Signed)
Victorino Dike from [facility] called to let our office know that the pt. Refused to come in to the office at this time.

## 2021-11-01 ENCOUNTER — Encounter: Payer: Self-pay | Admitting: Nurse Practitioner

## 2021-11-01 ENCOUNTER — Other Ambulatory Visit: Payer: Self-pay

## 2021-11-01 ENCOUNTER — Non-Acute Institutional Stay: Payer: Medicaid Other | Admitting: Nurse Practitioner

## 2021-11-01 VITALS — BP 138/66 | HR 88 | Temp 98.1°F | Resp 18 | Wt 271.0 lb

## 2021-11-01 DIAGNOSIS — R531 Weakness: Secondary | ICD-10-CM

## 2021-11-01 DIAGNOSIS — G8929 Other chronic pain: Secondary | ICD-10-CM

## 2021-11-01 DIAGNOSIS — Z720 Tobacco use: Secondary | ICD-10-CM

## 2021-11-01 NOTE — Progress Notes (Signed)
[facility] Consult Note Telephone: [phone]  Fax: [phone]    Date of encounter: 11/01/21 2:29 PM PATIENT NAME: Eugene [name] [id number]   [phone] (home)  DOB: [birth year] MRN: [id number] PRIMARY CARE PROVIDER:    [place]:    Contact Information     Name Relation Home Work [place], [place] Brother [phone]  [phone]      I met face to face with patient in facility. Palliative Care was asked to follow this patient by consultation request of  [facility] to address advance care planning and complex medical decision making. This is a follow up visit.                                  ASSESSMENT AND PLAN / Recommendations Symptom Management/Plan: 1. Advance Care Planning; DNR   2. Chronic back pain; continue with current regimen with lyrica; oxycodone; encourage mobility   3. Generalized weakness secondary to decompensation with recent hospitalization. Will encourage therapy, oob, mobility for strengthening, self independence. Agree to continue with prosthetics, then wishes to start therapy.    4. DM uncontrolled with HAIC 10.3. Discussed at length uncontrolled DM, insulin vs oral agents. We talked about importance of compliance. Explained about insulin with current clinical condition. We talked about diet, compliance. We talked about all the high carb snacks, candy he eats; We talked about option of Endocrinology referral remains pending for further exploration of DM management.   5. Tobacco abuse; smoking cessation encouraged, Eugene [name] declined   6. Palliative care encounter; Palliative care encounter; Palliative medicine team will continue to support patient, patient's family, and medical team. Visit consisted of counseling and education dealing with the complex and emotionally intense issues of symptom management and  palliative care in the setting of serious and potentially life-threatening illness   7. f/u 8 weeks  for ongoing monitoring chronic disease progression, ongoing discussions complex medical decision making  Follow up Palliative Care Visit: Palliative care will continue to follow for complex medical decision making, advance care planning, and clarification of goals. Return 8 weeks or prn.  I spent 67 minutes providing this consultation starting at 1:00pm. More than 50% of the time in this consultation was spent in counseling and care coordination. PPS: 50%  Chief Complaint: Follow up palliative consult for complex medical decision making  HISTORY OF PRESENT ILLNESS:  Eugene [name] is a 61 y.o. year old male  with medical history significant for Type 2 diabetes with gastroparesis, IBS, hepatitis C, anxiety and depression, chronic suprapubic catheter. Hospitalized 04/17/2021 to 04/25/2021 for gastroparesis, generalized abdominal pain, nauseas, vomiting, sepsis. Workup significant for Intractable vomiting secondary to severe diabetic gastroparesis, chronic pain syndrome currently on oxycodone, DM, UTI with chronic suprapubic catheter. Eugene [name] was d/c to SNF at [facility] where he currently resides. Eugene [name] requires assistance for mobility, transfers, sits in w/c. He does go to smoking patio to smoke daily. He is able to feed himself with current weight 271 lbs. No recent falls, wounds, infections, hospitalization per staff. Endocrinology consult pending. At present Eugene [name] is sleeping in his bed. Eugene [name] awoke to verbal cues, agree to [name] visit, interactive, discussed ros, symptoms, importance of mobility, being OOB. We talked about diet, nutrition, pending endocrinology consult. We talked about smoking cessation, declined. We talked about sleeping during the day,  motivation, importance of self independence, self care. We talked about importance of compliance, continues to psychiatry.  Medical goals reviewed. We talked about role pc in poc. Therapeutic listening, emotional support provided. Questions answered. Updated staff, no new changes, supportive role  History obtained from review of EMR, discussion with facility staff and  Eugene [name].  I reviewed available labs, medications, imaging, studies and related documents from the EMR.  Records reviewed and summarized above.   ROS 10 point system reviewed with Eugene [name] and staff all negative except HPI  Physical Exam: Constitutional: NAD General: obese, debilitated male EYES: lids intact ENMT: oral mucous membranes moist CV: S1S2, RRR Pulmonary: LCTA, no increased work of breathing, no cough Abdomen: intake 100%, normo-active BS + 4 quadrants, soft and non tender MSK: w/c dependent; left bka Skin: warm and dry Neuro:  + generalized weakness,  no cognitive impairment Psych: non-anxious affect, A and O x 3 Thank you for the opportunity to participate in the care of Eugene [name].  The palliative care team will continue to follow. Please call our office at [phone] if we can be of additional assistance.   Xavyer Steenson Ihor Gully, NP

## 2022-02-05 ENCOUNTER — Encounter: Payer: Self-pay | Admitting: Nurse Practitioner

## 2022-02-05 ENCOUNTER — Non-Acute Institutional Stay: Payer: Medicaid Other | Admitting: Nurse Practitioner

## 2022-02-05 VITALS — BP 138/66 | HR 88 | Temp 98.0°F | Resp 18 | Wt 263.7 lb

## 2022-02-05 DIAGNOSIS — E1143 Type 2 diabetes mellitus with diabetic autonomic (poly)neuropathy: Secondary | ICD-10-CM

## 2022-02-05 DIAGNOSIS — R110 Nausea: Secondary | ICD-10-CM

## 2022-02-05 DIAGNOSIS — R531 Weakness: Secondary | ICD-10-CM

## 2022-02-05 DIAGNOSIS — Z515 Encounter for palliative care: Secondary | ICD-10-CM

## 2022-02-05 NOTE — Progress Notes (Signed)
? ? ?[facility] ?[facility] Palliative [facility] Consult Note ?Telephone: [phone]  ?Fax: [phone]  ? ? ?Date of encounter: 02/05/22 ?7:11 PM ?PATIENT NAME: Cristofer Yaffe ?[address] ?[place] [address]   ?[phone] (home)  ?DOB: 27-Feb-1961 ?MRN: [id number] ?PRIMARY CARE PROVIDER:    ?[employer] ? ?RESPONSIBLE PARTY:    ?Contact Information   ? ? Name Relation Home Work [place]  ? [place], [place] Brother [phone]  [phone]  ? ?  ? ?I met face to face with patient in facility. Palliative Care was asked to follow this patient by consultation request of  [facility] to address advance care planning and complex medical decision making. This is a follow up visit.                                  ?ASSESSMENT AND PLAN / RECOMMENDATIONS: ?Symptom Management/Plan: ?1. Advance Care Planning; DNR, Discussed quality of life, challenges of facility, therapeutic listening, emotional support provided.  ?  ?2. Generalized weakness secondary to decompensation with recent hospitalization. Will encourage mobility, getting oob aside from going to smoking patio, possible the facility gym. Mr. Foushee endorses he will think about it. Will continue positive reinforcement ?  ?3. DM, We talked about importance of compliance when it comes to nutrition, food choices. Explained about insulin with current clinical condition. We talked disease progression DM ?  ?4. Chronic nausea secondary to gastroparesis with DM; continue with zofran, monitor symptoms, ppi, reviewed nutrition, smoking cessation which he declined.  ?  ?5. Palliative care encounter; Palliative care encounter; Palliative medicine team will continue to support patient, patient's family, and medical team. Visit consisted of counseling and education dealing with the complex and emotionally intense issues of symptom management and palliative care in the setting of serious and potentially life-threatening illness ?  ?6.  f/u 8 weeks  for ongoing monitoring chronic disease progression, ongoing discussions complex medical decision making ? ?Follow up Palliative Care Visit: Palliative care will continue to follow for complex medical decision making, advance care planning, and clarification of goals. Return 8 weeks or prn. ? ?I spent 46 minutes providing this consultation starting at 12;30pm.  More than 50% of the time in this consultation was spent in counseling and care coordination. ?PPS: 40% ? ?Chief Complaint: Follow up palliative consult for complex medical decision making ? ?HISTORY OF PRESENT ILLNESS:  Saveon Plant is a 61 y.o. year old male  with medical history significant for Type 2 diabetes with gastroparesis, IBS, hepatitis C, anxiety and depression, chronic suprapubic catheter. Hospitalized 04/17/2021 to 04/25/2021 for gastroparesis, generalized abdominal pain, nauseas, vomiting, sepsis. Workup significant for Intractable vomiting secondary to severe diabetic gastroparesis, chronic pain syndrome currently on oxycodone, DM, UTI with chronic suprapubic catheter. Mr. Jacob was d/c to SNF at [facility] where he currently resides. Mr. Stearns requires assistance for mobility, transfers, sits in w/c. He does go to smoking patio to smoke daily. He is able to feed himself with fair appetite, non-compliance with DM diet.  I visited and observed Mr. Ghuman. Mr. Taranto and I talked purpose of PC visit, Mr. Favata in agreement. Mr. Yale was lying in bed, appears chronically ill, comfortable. Foley intact, Mr. Virgil was cooperative with assessment. We talked about ros, pain, shortness of breath, appetite, compliance, smoking cessation which he declines. We talked about sleeping most of the day until 3pm when he gets up to go to the  smoking patio. We talked about medical goals, quality of life, more structured routine leaning to healthier lifestyle with food choices, exercise, going to the gym at the facility. We  talked about sleep patterns, hygiene. We talked about role pc in poc. We talked about f/u pc visit, updated staff. Emotional support provided  ?  ?History obtained from review of EMR, discussion with facility staff, NP and Mr. Roig.  ?I reviewed available labs, medications, imaging, studies and related documents from the EMR.  Records reviewed and summarized above.  ?  ?ROS ?Full 10 system review of systems performed and negative with exception of: as per HPI.  ?  ?Physical Exam: ?Constitutional: NAD ?General: debilitated, male ?EYES: lids intact ?ENMT: oral mucous membranes moist ?CV: S1S2, RRR ?Pulmonary: LCTA, no increased work of breathing, no cough ?Abdomen: soft and non tender ?MSK: w/c dependent ?Skin: warm and dry ?Neuro:  + generalized weakness,  no cognitive impairment ?Psych: non-anxious affect, A and O x 3 ?Thank you for the opportunity to participate in the care of Mr. Shough.  The palliative care team will continue to follow. Please call our office at [phone] if we can be of additional assistance.  ? ?Daisi Kentner Z Shajuan Musso, NP  ? ?   ?

## 2022-07-09 ENCOUNTER — Non-Acute Institutional Stay: Payer: Medicaid Other | Admitting: Nurse Practitioner

## 2022-07-09 ENCOUNTER — Encounter: Payer: Self-pay | Admitting: Nurse Practitioner

## 2022-07-09 VITALS — BP 177/82 | HR 76 | Temp 98.2°F | Resp 18 | Wt 266.0 lb

## 2022-07-09 DIAGNOSIS — G4701 Insomnia due to medical condition: Secondary | ICD-10-CM

## 2022-07-09 DIAGNOSIS — R531 Weakness: Secondary | ICD-10-CM

## 2022-07-09 DIAGNOSIS — E1143 Type 2 diabetes mellitus with diabetic autonomic (poly)neuropathy: Secondary | ICD-10-CM

## 2022-07-09 DIAGNOSIS — Z515 Encounter for palliative care: Secondary | ICD-10-CM

## 2022-07-09 DIAGNOSIS — Z720 Tobacco use: Secondary | ICD-10-CM

## 2022-07-09 DIAGNOSIS — R110 Nausea: Secondary | ICD-10-CM

## 2022-07-09 NOTE — Progress Notes (Signed)
[name] Consult Note Telephone: [phone]  Fax: [phone]    Date of encounter: 07/09/22 3:34 PM PATIENT NAME: [name] [address]   [phone] (home)  DOB: 1961-04-15 MRN: [id number] PRIMARY CARE PROVIDER:    [place]:    Contact Information     Name Relation Home Work [address]   Eugene [name] Brother [phone]  [phone]     I met face to face with patient in facility. Palliative Care was asked to follow this patient by consultation request of  Eugene [name] to address advance care planning and complex medical decision making. This is a follow up visit.                                  ASSESSMENT AND PLAN / RECOMMENDATIONS: Symptom Management/Plan: 1. Advance Care Planning; DNR, Discussed quality of life, challenges of facility, therapeutic listening, emotional support provided.    2. Generalized weakness, ongoing encouraged mobility as currently Eugene [name] was sitting in a w/c in his room. Discussed daily routine, encouraged healthy habits, improve self independence.   3. Chronic nausea secondary to DM gastroparesis, reviewed blood sugars, discussed the snacks he has in his room, bowls of candy. We talked about importance of chronic disease management of DM, medications, discussed nutrition, healthy DM snacks. We talked about importance of compliance. Eugene [name] verbalized understanding. Lengthy discussion, counseling, education completed.    4. Tobacco abuse discussed at length, declined  5. Insomnia, ongoing, discussed medications, sleep patterns, sleep hygiene. Encouraged Eugene [name] to practice not napping throughout the day, try to stay awake, oob, interactive, encouraged Eugene [name] to have more social interactions.    6. Palliative care encounter; Palliative care encounter; Palliative medicine team will continue to support  patient, patient's family, and medical team. Visit consisted of counseling and education dealing with the complex and emotionally intense issues of symptom management and palliative care in the setting of serious and potentially life-threatening illness   Follow up Palliative Care Visit: Palliative care will continue to follow for complex medical decision making, advance care planning, and clarification of goals. Return 8 weeks or prn.   I spent 46 minutes providing this consultation starting at 2:30 pm.  More than 50% of the time in this consultation was spent in counseling and care coordination. PPS: 50%   Chief Complaint: Follow up palliative consult for complex medical decision making   HISTORY OF PRESENT ILLNESS:  Eugene [name] is a 61 y.o. year old male  with medical history significant for Type 2 diabetes with gastroparesis, IBS, hepatitis C, anxiety and depression, chronic suprapubic catheter. Eugene [name] resides at [name] [place] [place]. Eugene [name] requires assistance with transfers, adl's. Eugene [name] has bilateral prosthetics, BLE. Staff endorses Eugene [name] continues to feed himself with difficulty managing blood sugars with ongoing snacking, candy. Sleeping throughout the day, up all night per staff. At present Eugene [name] is sitting in the w/c in his room. Eugene [name] is engaging, cooperative. We talked about purpose of pc visit. We talked about ros, functional abilities. We talked about importance of mobility, self independence. Eugene [name]. Eugene [name] endorses "they do not fit [name], suppose to be getting another pair". Eugene [name] endorses until he is able to get the prosthetics he will continue to be in a w/c. We talked about  importance of dm diet, healthy snacks, chronic disease management with progression of dm. We talked about smoking, declines smoking cessation. We talked about importance of self care. We talked about medical goals. Talked about residing at  facility. We talked about insomnia. Eugene. Lambson requested trazodone to be increase to 179m qhs per his home dose [name] We talked at length about sleep hygiene, importance of no sleeping during the day, then staying up all night. We talked about sleep patterns. We talked about alternative modalities to help sleep aside from medications. Eugene. PrSchoffstallndorses he would try. We talked about role pc in poc. Will continue to follow, monitor. Updated staff. No new orders.    We talked about role pc in poc. We talked about f/u pc visit, updated staff. Emotional support provided    History obtained from review of EMR, discussion with facility staff, NP and Eugene. PrLopes I reviewed available labs, medications, imaging, studies and related documents from the EMR.  Records reviewed and summarized above.    ROS Full 10 system review of systems performed and negative with exception of: as per HPI.    Physical Exam: Constitutional: NAD General: debilitated, male EYES: lids intact ENMT: oral mucous membranes moist CV: S1S2, RRR Pulmonary: LCTA, no increased work of breathing, no cough Abdomen: soft and non tender MSK: w/c dependent Skin: warm and dry Neuro:  + generalized weakness,  no cognitive impairment Psych: non-anxious affect, A and O x 3 Thank you for the opportunity to participate in the care of Eugene. PrOutten The palliative care team will continue to follow. Please call our office at [phone] we can be of additional assistance.   Jaray Boliver Z Ihor GullyNP

## 2022-09-28 ENCOUNTER — Non-Acute Institutional Stay: Payer: Medicaid Other | Admitting: Nurse Practitioner

## 2022-09-28 VITALS — BP 128/68 | HR 78 | Temp 97.3°F | Resp 18 | Wt 244.0 lb

## 2022-09-28 DIAGNOSIS — Z720 Tobacco use: Secondary | ICD-10-CM

## 2022-09-28 DIAGNOSIS — E1143 Type 2 diabetes mellitus with diabetic autonomic (poly)neuropathy: Secondary | ICD-10-CM

## 2022-09-28 DIAGNOSIS — Z515 Encounter for palliative care: Secondary | ICD-10-CM

## 2022-09-29 ENCOUNTER — Encounter: Payer: Self-pay | Admitting: Nurse Practitioner

## 2022-09-29 NOTE — Progress Notes (Signed)
Therapist, nutritional Palliative Care Consult Note Telephone: [phone]  Fax: [phone]    Date of encounter: 09/29/22 2:26 PM PATIENT NAME: Eugene [name] [address] [place] [address]   [phone] (home)  DOB: 1961/05/13 MRN: [id number] PRIMARY CARE PROVIDER:    [facility]  RESPONSIBLE PARTY:    Contact Information     Name Relation Home Work [address]   Eugene [name], Eugene [name] Brother [phone]  [phone]        I met face to face with patient in facility. Palliative Care was asked to follow this patient by consultation request of  [facility] to address advance care planning and complex medical decision making. This is a follow up visit.                                  ASSESSMENT AND PLAN / RECOMMENDATIONS: Symptom Management/Plan: 1. Advance Care Planning; DNR, Discussed quality of life, challenges of facility, therapeutic listening, emotional support provided.    2. Generalized weakness, Discussed daily routine, encouraged healthy habits, improve self independence. Sleep hygiene, sleep patterns,    3. Chronic nausea secondary to DM gastroparesis, reviewed blood sugars, We talked about importance of chronic disease management of DM, medications, discussed nutrition, healthy DM snacks. We talked about importance of compliance with his recent behaviors of non-complaince. Lengthy discussion, counseling, education completed.    4. Tobacco abuse/vaping discussed at length about non-compliance, educated on risks on health, overall functional decline, declined   5. Insomnia, ongoing, discussed medications, sleep patterns, sleep hygiene. Encouraged Eugene [name] to practice not napping throughout the day, try to stay awake, oob, interactive, encouraged Eugene [name] to have more social interactions.    6. Palliative care encounter; Palliative care encounter; Palliative medicine team will continue to support patient, patient's  family, and medical team. Visit consisted of counseling and education dealing with the complex and emotionally intense issues of symptom management and palliative care in the setting of serious and potentially life-threatening illness   Follow up Palliative Care Visit: Palliative care will continue to follow for complex medical decision making, advance care planning, and clarification of goals. Return 4 to 8 weeks or prn.   I spent 48 minutes providing this consultation.  More than 50% of the time in this consultation was spent in counseling and care coordination. PPS: 50%   Chief Complaint: Follow up palliative consult for complex medical decision making   HISTORY OF PRESENT ILLNESS:  Eugene [name] is a 62 y.o. year old male  with medical history significant for Type 2 diabetes with gastroparesis, IBS, hepatitis C, anxiety and depression, chronic suprapubic catheter. Eugene [name] resides at [facility]. Eugene [name] requires assistance with transfers, adl's. Eugene [name] has bilateral prosthetics, BLE. Staff endorses Eugene [name] continues to require assistance with transfers, bathing, dressing, mobility, though he continues to go to smoking patio to smoke. Per staff he continues to vap in his room after he as been instructed non-compliance. Per staff Eugene [name] continues to eat what he wishes, non-compliance with diet, sleeping all day, stays up at night. Eugene [name] continues to decline medications intermit per staff. Staff endorses overall decline, debility. At present Eugene [name] is sleeping in his bed, appears comfortable. No visitors present. We talked about purpose of pc visit, ros, chronic debility, non-compliance at length including vaping, smoking, diet, not following regulations at facility for compliance. We talked about importance of self  care, self independence, healthy choices including food choices, adequate sleep. We talked about risks of non-compliance with impact on health. Eugene [name] verbalized  understanding. Therapeutic listening, emotional support provided, questions answered. Medical goals, poc, medications reviewed. Continue to encourage healthy life choices with nutrition, encourage smoking cessation, no vaping, sleep hygiene, mobility, exercise.    We talked about role pc in poc. We talked about f/u pc visit, updated staff. Emotional support provided    History obtained from review of EMR, discussion with facility staff, NP and Eugene. [name].  I reviewed available labs, medications, imaging, studies and related documents from the EMR.  Records reviewed and summarized above.  Physical Exam: Constitutional: NAD General: debilitated, male EYES: lids intact ENMT: oral mucous membranes moist CV: S1S2, RRR Pulmonary: LCTA Abdomen: soft and non tender MSK: w/c dependent Skin: warm and dry Neuro:  + generalized weakness,  no cognitive impairment Psych: non-anxious affect, A and O x 3 Thank you for the opportunity to participate in the care of Eugene [name]. Please call our office at [phone] if we can be of additional assistance.   Eugene Camus Ihor Gully, NP

## 2022-10-30 ENCOUNTER — Non-Acute Institutional Stay: Payer: Medicaid Other | Admitting: Nurse Practitioner

## 2022-10-30 ENCOUNTER — Encounter: Payer: Self-pay | Admitting: Nurse Practitioner

## 2022-10-30 VITALS — BP 128/68 | HR 78 | Temp 97.4°F | Resp 18 | Wt 238.8 lb

## 2022-10-30 DIAGNOSIS — Z515 Encounter for palliative care: Secondary | ICD-10-CM

## 2022-10-30 DIAGNOSIS — Z720 Tobacco use: Secondary | ICD-10-CM

## 2022-10-30 DIAGNOSIS — E1143 Type 2 diabetes mellitus with diabetic autonomic (poly)neuropathy: Secondary | ICD-10-CM

## 2022-10-30 DIAGNOSIS — G4701 Insomnia due to medical condition: Secondary | ICD-10-CM

## 2022-10-30 NOTE — Progress Notes (Signed)
[facility] Consult Note Telephone: [phone]  Fax: [phone]    Date of encounter: 10/30/22 11:25 AM PATIENT NAME: Eugene [name] [id number]   [phone] (home)  DOB: 12-Apr-1961 MRN: [id number] PRIMARY CARE PROVIDER:    [place]:    Contact Information     Name Relation Home Work [place], [place] Brother [phone]  [phone]     I met face to face with patient in facility. Palliative Care was asked to follow this patient by consultation request of  [place] to address advance care planning and complex medical decision making. This is a follow up visit.                                  ASSESSMENT AND PLAN / RECOMMENDATIONS: Symptom Management/Plan: 1. Advance Care Planning; DNR, Discussed quality of life, challenges of facility, therapeutic listening, emotional support provided.    2. Generalized weakness, Discussed daily routine, encouraged healthy habits, improve self independence. Sleep hygiene, sleep patterns,    3. Chronic nausea secondary to DM gastroparesis, reviewed blood sugars, re-visited importance of chronic disease management of DM, medications, discussed nutrition, healthy DM snacks. We talked about importance of compliance with his recent behaviors of non-complaince. Lengthy discussion, counseling, education completed.    4. Tobacco abuse/vaping re-attempted to discuss non-compliance, educated on risks on health, overall functional decline, declined   5. Insomnia, re-attempted to discuss, reviewed medications, sleep patterns, sleep hygiene. Eugene Loe endorses he continues to try to sleep at night. We talked about coping strategies, quality of life, what brings him joy.   6. Palliative care encounter; Palliative care encounter; Palliative medicine team will continue to support patient, patient's family, and medical team.  Visit consisted of counseling and education dealing with the complex and emotionally intense issues of symptom management and palliative care in the setting of serious and potentially life-threatening illness   Follow up Palliative Care Visit: Palliative care will continue to follow for complex medical decision making, advance care planning, and clarification of goals. Return 4 to 8 weeks or prn.   I spent 47 minutes providing this consultation 10:30 am.  More than 50% of the time in this consultation was spent in counseling and care coordination. PPS: 50%   Chief Complaint: Follow up palliative consult for complex medical decision making   HISTORY OF PRESENT ILLNESS:  Eugene [name] is a 62 y.o. year old male  with medical history significant for Type 2 diabetes with gastroparesis, IBS, hepatitis C, anxiety and depression, chronic suprapubic catheter. Eugene. Brosseau resides at [facility] [place]. Eugene. Mejorado requires assistance with transfers, adl's. Eugene [name] has bilateral prosthetics, BLE. Staff endorses Eugene. Villwock continues to require assistance with transfers, bathing, dressing, mobility, though he continues to go to smoking patio to smoke. Purpose of today PC f/u visit further discussion monitor trends of appetite, weights, monitor for functional, cognitive decline with chronic disease progression, assess any active symptoms, supportive role. I visited and observed Eugene [name] lying in bed, discussed how he has been feeling, "tired", "wanting to sleep", "not sleeping at night". We talked about ros, functional abilities, oob, fall risk, sleep patterns, sleep hygiene, coping strategies residing at facilities with barrier, his prosthetics that do not fit, smoking cessation, mobility, importance of oob, fall precautions. Talked about appetite, food choices, nutritional counseling done though limited with Eugene  Erion non-compliance. Support provided. Medications, poc, goc reviewed. Currently Eugene [name] is stable.  Updated staff.   PC f/u visit further discussion monitor trends of appetite, weights, monitor for functional, cognitive decline with chronic disease progression, assess any active symptoms, supportive role.   History obtained from review of EMR, discussion with facility staff, NP and Eugene. Betty.  I reviewed available labs, medications, imaging, studies and related documents from the EMR.  Records reviewed and summarized above.  Physical Exam: Constitutional: NAD General: debilitated, male EYES: lids intact ENMT: oral mucous membranes moist CV: S1S2, RRR Pulmonary: LCTA Abdomen: soft and non tender MSK: w/c dependent Skin: warm and dry Neuro:  + generalized weakness,  no cognitive impairment Psych: non-anxious affect, A and O x 3 Thank you for the opportunity to participate in the care of Eugene. [name]. Please call our office at [phone] if we can be of additional assistance.   Arlyne Brandes Ihor Gully, NP

## 2023-01-11 ENCOUNTER — Encounter: Payer: Self-pay | Admitting: Nurse Practitioner

## 2023-01-11 ENCOUNTER — Non-Acute Institutional Stay: Payer: Medicaid Other | Admitting: Nurse Practitioner

## 2023-01-11 VITALS — BP 125/76 | HR 87 | Temp 98.0°F | Resp 18 | Wt 237.8 lb

## 2023-01-11 DIAGNOSIS — R531 Weakness: Secondary | ICD-10-CM

## 2023-01-11 DIAGNOSIS — E1143 Type 2 diabetes mellitus with diabetic autonomic (poly)neuropathy: Secondary | ICD-10-CM

## 2023-01-11 DIAGNOSIS — Z515 Encounter for palliative care: Secondary | ICD-10-CM

## 2023-01-11 DIAGNOSIS — Z720 Tobacco use: Secondary | ICD-10-CM

## 2023-01-11 NOTE — Progress Notes (Signed)
Therapist, nutritional Palliative Care Consult Note Telephone: [phone]  Fax: [phone]    Date of encounter: 01/11/23 5:26 PM PATIENT NAME: Eugene [name] [address] [place] [address]   [phone] (home)  DOB: 11/06/1960 MRN: [id number] PRIMARY CARE PROVIDER:    [facility]  RESPONSIBLE PARTY:    Contact Information     Name Relation Home Work [address]   [name], [name] Brother [phone]  [phone]        I met face to face with patient in facility. Palliative Care was asked to follow this patient by consultation request of  [facility] to address advance care planning and complex medical decision making. This is a follow up visit.                                  ASSESSMENT AND PLAN / RECOMMENDATIONS: Symptom Management/Plan: 1. Advance Care Planning; DNR, Discussed quality of life, challenges of facility, therapeutic listening, emotional support provided.    2. Generalized weakness with debility ongoing Discussed daily routine, encouraged healthy habits, improve self independence. Sleep hygiene, sleep patterns,    3. Chronic nausea ongoing; secondary to DM gastroparesis, reviewed blood sugars, re-visited importance of chronic disease management of DM, medications, discussed nutrition, healthy DM snacks. We talked about importance of compliance with his recent behaviors of non-complaince. Lengthy discussion, counseling, education completed.    4. Tobacco abuse/vaping re-attempted to discuss non-compliance, educated on risks on health, overall functional decline, declined   5. Palliative care encounter; Palliative care encounter; Palliative medicine team will continue to support patient, patient's family, and medical team. Visit consisted of counseling and education dealing with the complex and emotionally intense issues of symptom management and palliative care in the setting of serious and potentially  life-threatening illness   Follow up Palliative Care Visit: Palliative care will continue to follow for complex medical decision making, advance care planning, and clarification of goals. Return 4 to 8 weeks or prn.   I spent 48 minutes providing this consultation.  More than 50% of the time in this consultation was spent in counseling and care coordination. PPS: 50%   Chief Complaint: Follow up palliative consult for complex medical decision making   HISTORY OF PRESENT ILLNESS:  Eugene [name] is a 62 y.o. year old male  with medical history significant for Type 2 diabetes with gastroparesis, IBS, hepatitis C, anxiety and depression, chronic suprapubic catheter. Eugene [name] resides at [facility]. Eugene [name] requires assistance with transfers, adl's. Eugene [name] has bilateral prosthetics, BLE. Staff endorses Eugene [name] continues to require assistance with transfers, bathing, dressing, mobility, though he continues to go to smoking patio to smoke. Purpose of today PC f/u visit further discussion monitor trends of appetite, weights, monitor for functional, cognitive decline with chronic disease progression, assess any active symptoms, supportive role. I visited and observed Eugene [name] lying in bed, appears comfortable, no visitors present. Eugene [name] was sleeping, awoke to verbal cues. We talked about ros, sleep patterns-sleeping during the day, sleep hygiene. We talked about functional limitations, smoking cessation which he declined. We talked about appetite, food choices, compliance and residing at facility with what he finds valuable. We talked about barriers he encounters, stressed compliance. Therapeutic listening, emotional support provided. Medications, poc, goc reviewed. Currently Eugene [name] is stable. Updated staff.    PC f/u visit further discussion monitor trends of appetite, weights, monitor for functional, cognitive  decline with chronic disease progression, assess any active symptoms,  supportive role.   History obtained from review of EMR, discussion with facility staff, NP and Eugene. [name].  I reviewed available labs, medications, imaging, studies and related documents from the EMR.  Records reviewed and summarized above.  Physical Exam: General: debilitated, male EYES: lids intact ENMT: oral mucous membranes moist CV: S1S2, RRR Pulmonary: LCTA Neuro:  + generalized weakness,  no cognitive impairment Psych: non-anxious affect, A and O x 3 Thank you for the opportunity to participate in the care of Eugene. [name]. Please call our office at [phone] if we can be of additional assistance.   Jaleeya Mcnelly Prince Rome, NP

## 2023-03-01 ENCOUNTER — Non-Acute Institutional Stay: Payer: Medicaid Other | Admitting: Nurse Practitioner

## 2023-03-01 ENCOUNTER — Encounter: Payer: Self-pay | Admitting: Nurse Practitioner

## 2023-03-01 VITALS — BP 146/70 | HR 90 | Temp 98.0°F | Resp 18 | Wt 237.0 lb

## 2023-03-01 DIAGNOSIS — Z515 Encounter for palliative care: Secondary | ICD-10-CM

## 2023-03-01 DIAGNOSIS — R531 Weakness: Secondary | ICD-10-CM

## 2023-03-01 DIAGNOSIS — Z720 Tobacco use: Secondary | ICD-10-CM

## 2023-03-01 DIAGNOSIS — E1143 Type 2 diabetes mellitus with diabetic autonomic (poly)neuropathy: Secondary | ICD-10-CM

## 2023-03-01 DIAGNOSIS — G8929 Other chronic pain: Secondary | ICD-10-CM

## 2023-03-01 DIAGNOSIS — G4701 Insomnia due to medical condition: Secondary | ICD-10-CM

## 2023-03-01 NOTE — Progress Notes (Signed)
Therapist, nutritional Palliative Care Consult Note Telephone: [phone]  Fax: [phone]    Date of encounter: 03/01/23 11:23 AM PATIENT NAME: Eugene [name] [address] [place] [address]   [phone] (home)  DOB: [birth year] MRN: [id number] PRIMARY CARE PROVIDER:    [facility]  RESPONSIBLE PARTY:    Contact Information     Name Relation Home Work [address]   Eugene [name], Eugene [name] Brother [phone]  [phone]        I met face to face with patient in facility. Palliative Care was asked to follow this patient by consultation request of  [facility] to address advance care planning and complex medical decision making. This is a follow up visit.                                  ASSESSMENT AND PLAN / RECOMMENDATIONS: Symptom Management/Plan: 1. Advance Care Planning; DNR, Discussed quality of life, challenges of facility, therapeutic listening, emotional support provided.    2. Generalized weakness with debility ongoing Discussed daily routine, encouraged healthy habits, improve self independence. Sleep hygiene, sleep patterns,    3. Chronic nausea ongoing; secondary to DM gastroparesis, reviewed blood sugars, re-visited importance of chronic disease management of DM  01/25/2023 weight 237 lbs   4. Tobacco abuse/vaping re-attempted to discuss non-compliance, educated on risks on health, overall functional decline, declined   5. Palliative care encounter; Palliative care encounter; Palliative medicine team will continue to support patient, patient's family, and medical team. Visit consisted of counseling and education dealing with the complex and emotionally intense issues of symptom management and palliative care in the setting of serious and potentially life-threatening illness   Follow up Palliative Care Visit: Palliative care will continue to follow for complex medical decision making, advance care planning, and  clarification of goals. Return 2 to 8 weeks or prn.   I spent 46 minutes providing this consultation.  More than 50% of the time in this consultation was spent in counseling and care coordination. PPS: 50%   Chief Complaint: Follow up palliative consult for complex medical decision making   HISTORY OF PRESENT ILLNESS:  Eugene [name] is a 62 y.o. year old male  with medical history significant for Type 2 diabetes with gastroparesis, IBS, hepatitis C, anxiety and depression, chronic suprapubic catheter. Mr. Sekhon resides at [facility], [facility]. Mr. Aredondo requires assistance with transfers, adl's. Mr Menze has bilateral prosthetics, BLE. Staff endorses Mr. Osmanski continues to require assistance with transfers, bathing, dressing, mobility, though he continues to go to smoking patio to smoke. Purpose of today PC f/u visit further discussion monitor trends of appetite, weights, monitor for functional, cognitive decline with chronic disease progression, assess any active symptoms, supportive role. I visited and observed Mr Stitz lying in bed, appears comfortable, no visitors present. Mr Terral and I talked about ros, getting oob. Mr Depaulis endorses he gets out of bed at 7pm every day to go smoke cigarettes then comes back in. Mr Malinsky is non-compliant, declines smoking cessation. Mr Shellenbarger and I talked at length about diet, foods that will irritate his gastroparesis, chronic disease with chronic nausea. Requested nursing staff to give anti-medic. We revisited sleep patterns, encouraged sleep hygiene. Overall Mr Salsbury continues to be non-compliant, stable.  Education done on chronic diease of dm, gastroparesis, chronic nausea. Therapeutic listening, emotional support provided. Medications, poc, goc reviewed. Currently Mr Hardcastle is stable. Updated staff.  PC f/u visit further discussion monitor trends of appetite, weights, monitor for functional, cognitive decline with chronic disease progression, assess any  active symptoms, supportive role. History obtained from review of EMR, discussion with facility staff, NP and Mr. Mittelman.  I reviewed available labs, medications, imaging, studies and related documents from the EMR.  Records reviewed and summarized above.  Physical Exam: General: debilitated, male ENMT: oral mucous membranes moist CV: S1S2, RRR Pulmonary: LCTA Abdomen: round, soft, +BS Neuro:  + generalized weakness,  no cognitive impairment Psych: non-anxious affect, A and O x 3  Thank you for the opportunity to participate in the care of Mr. Kelley. Please call our office at [phone] if we can be of additional assistance.   Morty Ortwein Prince Rome, NP

## 2024-04-14 ENCOUNTER — Other Ambulatory Visit: Payer: Self-pay

## 2024-04-14 ENCOUNTER — Emergency Department

## 2024-04-14 ENCOUNTER — Inpatient Hospital Stay
Admission: EM | Admit: 2024-04-14 | Discharge: 2024-05-01 | DRG: 682 | Disposition: A | Discharge status: Skilled Nursing Facility | Source: Skilled Nursing Facility | Attending: Internal Medicine | Admitting: Internal Medicine

## 2024-04-14 DIAGNOSIS — Z89511 Acquired absence of right leg below knee: Secondary | ICD-10-CM

## 2024-04-14 DIAGNOSIS — N179 Acute kidney failure, unspecified: Principal | ICD-10-CM | POA: Diagnosis present

## 2024-04-14 DIAGNOSIS — T402X5A Adverse effect of other opioids, initial encounter: Secondary | ICD-10-CM | POA: Diagnosis present

## 2024-04-14 DIAGNOSIS — Z79891 Long term (current) use of opiate analgesic: Secondary | ICD-10-CM

## 2024-04-14 DIAGNOSIS — Z5941 Food insecurity: Secondary | ICD-10-CM

## 2024-04-14 DIAGNOSIS — G894 Chronic pain syndrome: Secondary | ICD-10-CM | POA: Diagnosis present

## 2024-04-14 DIAGNOSIS — E1169 Type 2 diabetes mellitus with other specified complication: Secondary | ICD-10-CM | POA: Diagnosis present

## 2024-04-14 DIAGNOSIS — E1165 Type 2 diabetes mellitus with hyperglycemia: Secondary | ICD-10-CM | POA: Diagnosis present

## 2024-04-14 DIAGNOSIS — I130 Hypertensive heart and chronic kidney disease with heart failure and stage 1 through stage 4 chronic kidney disease, or unspecified chronic kidney disease: Secondary | ICD-10-CM | POA: Diagnosis present

## 2024-04-14 DIAGNOSIS — K567 Ileus, unspecified: Secondary | ICD-10-CM | POA: Insufficient documentation

## 2024-04-14 DIAGNOSIS — F17210 Nicotine dependence, cigarettes, uncomplicated: Secondary | ICD-10-CM | POA: Diagnosis present

## 2024-04-14 DIAGNOSIS — E860 Dehydration: Secondary | ICD-10-CM | POA: Diagnosis present

## 2024-04-14 DIAGNOSIS — Z5986 Financial insecurity: Secondary | ICD-10-CM

## 2024-04-14 DIAGNOSIS — E66813 Obesity, class 3: Secondary | ICD-10-CM | POA: Diagnosis present

## 2024-04-14 DIAGNOSIS — J9621 Acute and chronic respiratory failure with hypoxia: Secondary | ICD-10-CM

## 2024-04-14 DIAGNOSIS — J9601 Acute respiratory failure with hypoxia: Secondary | ICD-10-CM

## 2024-04-14 DIAGNOSIS — N319 Neuromuscular dysfunction of bladder, unspecified: Secondary | ICD-10-CM | POA: Diagnosis present

## 2024-04-14 DIAGNOSIS — Z79890 Hormone replacement therapy (postmenopausal): Secondary | ICD-10-CM

## 2024-04-14 DIAGNOSIS — Z794 Long term (current) use of insulin: Secondary | ICD-10-CM

## 2024-04-14 DIAGNOSIS — E870 Hyperosmolality and hypernatremia: Secondary | ICD-10-CM | POA: Diagnosis not present

## 2024-04-14 DIAGNOSIS — E874 Mixed disorder of acid-base balance: Secondary | ICD-10-CM | POA: Diagnosis present

## 2024-04-14 DIAGNOSIS — T83518A Infection and inflammatory reaction due to other urinary catheter, initial encounter: Secondary | ICD-10-CM | POA: Diagnosis present

## 2024-04-14 DIAGNOSIS — K3184 Gastroparesis: Secondary | ICD-10-CM | POA: Diagnosis present

## 2024-04-14 DIAGNOSIS — E1121 Type 2 diabetes mellitus with diabetic nephropathy: Secondary | ICD-10-CM | POA: Diagnosis not present

## 2024-04-14 DIAGNOSIS — E785 Hyperlipidemia, unspecified: Secondary | ICD-10-CM | POA: Diagnosis present

## 2024-04-14 DIAGNOSIS — Z5982 Transportation insecurity: Secondary | ICD-10-CM

## 2024-04-14 DIAGNOSIS — Z66 Do not resuscitate: Secondary | ICD-10-CM | POA: Diagnosis present

## 2024-04-14 DIAGNOSIS — N390 Urinary tract infection, site not specified: Secondary | ICD-10-CM | POA: Diagnosis present

## 2024-04-14 DIAGNOSIS — K589 Irritable bowel syndrome without diarrhea: Secondary | ICD-10-CM | POA: Diagnosis present

## 2024-04-14 DIAGNOSIS — E876 Hypokalemia: Secondary | ICD-10-CM | POA: Diagnosis not present

## 2024-04-14 DIAGNOSIS — E1122 Type 2 diabetes mellitus with diabetic chronic kidney disease: Secondary | ICD-10-CM | POA: Diagnosis present

## 2024-04-14 DIAGNOSIS — K56609 Unspecified intestinal obstruction, unspecified as to partial versus complete obstruction: Secondary | ICD-10-CM

## 2024-04-14 DIAGNOSIS — Z79899 Other long term (current) drug therapy: Secondary | ICD-10-CM

## 2024-04-14 DIAGNOSIS — K560 Paralytic ileus: Secondary | ICD-10-CM | POA: Diagnosis not present

## 2024-04-14 DIAGNOSIS — Z7984 Long term (current) use of oral hypoglycemic drugs: Secondary | ICD-10-CM

## 2024-04-14 DIAGNOSIS — Z9359 Other cystostomy status: Secondary | ICD-10-CM

## 2024-04-14 DIAGNOSIS — E875 Hyperkalemia: Secondary | ICD-10-CM | POA: Diagnosis present

## 2024-04-14 DIAGNOSIS — J189 Pneumonia, unspecified organism: Secondary | ICD-10-CM | POA: Diagnosis not present

## 2024-04-14 DIAGNOSIS — T380X5A Adverse effect of glucocorticoids and synthetic analogues, initial encounter: Secondary | ICD-10-CM | POA: Diagnosis not present

## 2024-04-14 DIAGNOSIS — Z993 Dependence on wheelchair: Secondary | ICD-10-CM

## 2024-04-14 DIAGNOSIS — N184 Chronic kidney disease, stage 4 (severe): Secondary | ICD-10-CM | POA: Diagnosis present

## 2024-04-14 DIAGNOSIS — I5033 Acute on chronic diastolic (congestive) heart failure: Secondary | ICD-10-CM | POA: Diagnosis not present

## 2024-04-14 DIAGNOSIS — R809 Proteinuria, unspecified: Secondary | ICD-10-CM | POA: Diagnosis present

## 2024-04-14 DIAGNOSIS — K5903 Drug induced constipation: Secondary | ICD-10-CM | POA: Diagnosis present

## 2024-04-14 DIAGNOSIS — Z981 Arthrodesis status: Secondary | ICD-10-CM

## 2024-04-14 DIAGNOSIS — Z89512 Acquired absence of left leg below knee: Secondary | ICD-10-CM

## 2024-04-14 DIAGNOSIS — Y846 Urinary catheterization as the cause of abnormal reaction of the patient, or of later complication, without mention of misadventure at the time of the procedure: Secondary | ICD-10-CM | POA: Diagnosis present

## 2024-04-14 DIAGNOSIS — Z6841 Body Mass Index (BMI) 40.0 and over, adult: Secondary | ICD-10-CM

## 2024-04-14 DIAGNOSIS — E1143 Type 2 diabetes mellitus with diabetic autonomic (poly)neuropathy: Secondary | ICD-10-CM | POA: Diagnosis present

## 2024-04-14 LAB — URINALYSIS, W/ REFLEX TO CULTURE (INFECTION SUSPECTED)
Bilirubin Urine: NEGATIVE
Glucose, UA: 50 mg/dL — AB
Ketones, ur: NEGATIVE mg/dL
Nitrite: NEGATIVE
Protein, ur: >=300 mg/dL — AB
RBC / HPF: >50 RBC/hpf (ref 0–5)
Specific Gravity, Urine: 1.012 (ref 1.005–1.030)
WBC, UA: >50 WBC/hpf (ref 0–5)
pH: 5 (ref 5.0–8.0)

## 2024-04-14 LAB — COMPREHENSIVE METABOLIC PANEL WITH GFR
ALT: 19 U/L (ref 0–44)
AST: 18 U/L (ref 15–41)
Albumin: 2 g/dL — ABNORMAL LOW (ref 3.5–5.0)
Alkaline Phosphatase: 58 U/L (ref 38–126)
Anion gap: 12 (ref 5–15)
BUN: 60 mg/dL — ABNORMAL HIGH (ref 8–23)
CO2: 17 mmol/L — ABNORMAL LOW (ref 22–32)
Calcium: 8.2 mg/dL — ABNORMAL LOW (ref 8.9–10.3)
Chloride: 112 mmol/L — ABNORMAL HIGH (ref 98–111)
Creatinine, Ser: 4.23 mg/dL — ABNORMAL HIGH (ref 0.61–1.24)
GFR, Estimated: 15 mL/min — ABNORMAL LOW (ref >60)
Glucose, Bld: 86 mg/dL (ref 70–99)
Potassium: 5.2 mmol/L — ABNORMAL HIGH (ref 3.5–5.1)
Sodium: 141 mmol/L (ref 135–145)
Total Bilirubin: 0.5 mg/dL (ref 0.0–1.2)
Total Protein: 6.5 g/dL (ref 6.5–8.1)

## 2024-04-14 LAB — CBC WITH DIFFERENTIAL/PLATELET
Abs Immature Granulocytes: 0.03 K/uL (ref 0.00–0.07)
Basophils Absolute: 0.1 K/uL (ref 0.0–0.1)
Basophils Relative: 1 %
Eosinophils Absolute: 0.3 K/uL (ref 0.0–0.5)
Eosinophils Relative: 4 %
HCT: 35.1 % — ABNORMAL LOW (ref 39.0–52.0)
Hemoglobin: 11.2 g/dL — ABNORMAL LOW (ref 13.0–17.0)
Immature Granulocytes: 0 %
Lymphocytes Relative: 26 %
Lymphs Abs: 2 K/uL (ref 0.7–4.0)
MCH: 29.9 pg (ref 26.0–34.0)
MCHC: 31.9 g/dL (ref 30.0–36.0)
MCV: 93.6 fL (ref 80.0–100.0)
Monocytes Absolute: 0.8 K/uL (ref 0.1–1.0)
Monocytes Relative: 10 %
Neutro Abs: 4.5 K/uL (ref 1.7–7.7)
Neutrophils Relative %: 59 %
Platelets: 231 K/uL (ref 150–400)
RBC: 3.75 MIL/uL — ABNORMAL LOW (ref 4.22–5.81)
RDW: 14.1 % (ref 11.5–15.5)
WBC: 7.7 K/uL (ref 4.0–10.5)
nRBC: 0 % (ref 0.0–0.2)

## 2024-04-14 LAB — CBG MONITORING, ED
Glucose-Capillary: 190 mg/dL — ABNORMAL HIGH (ref 70–99)
Glucose-Capillary: 161 mg/dL — ABNORMAL HIGH (ref 70–99)

## 2024-04-14 LAB — HEMOGLOBIN A1C
Hgb A1c MFr Bld: 6 % — ABNORMAL HIGH (ref 4.8–5.6)
Mean Plasma Glucose: 125.5 mg/dL

## 2024-04-14 LAB — LIPASE, BLOOD: Lipase: 21 U/L (ref 11–51)

## 2024-04-14 MED ORDER — INSULIN ASPART 100 UNIT/ML IJ SOLN
0.0000 [IU] | Freq: Every day | INTRAMUSCULAR | Status: DC
  Administered 2024-04-17: 2 [IU] via SUBCUTANEOUS
  Administered 2024-04-20: 4 [IU] via SUBCUTANEOUS
  Administered 2024-04-21: 3 [IU] via SUBCUTANEOUS
  Administered 2024-04-25: 2 [IU] via SUBCUTANEOUS
  Administered 2024-04-26: 3 [IU] via SUBCUTANEOUS
  Administered 2024-04-28 – 2024-04-30 (×3): 2 [IU] via SUBCUTANEOUS
  Filled 2024-04-14 (×8): qty 1

## 2024-04-14 MED ORDER — AMLODIPINE BESYLATE 10 MG PO TABS
10.0000 mg | ORAL_TABLET | Freq: Every day | ORAL | Status: DC
  Administered 2024-04-14 – 2024-04-23 (×9): 10 mg via ORAL
  Filled 2024-04-14: qty 2
  Filled 2024-04-14 (×9): qty 1

## 2024-04-14 MED ORDER — SODIUM CHLORIDE 0.9 % IV SOLN: INTRAVENOUS | Status: DC

## 2024-04-14 MED ORDER — HEPARIN SODIUM (PORCINE) 5000 UNIT/ML IJ SOLN
5000.0000 [IU] | Freq: Three times a day (TID) | INTRAMUSCULAR | Status: DC
  Administered 2024-04-14 – 2024-04-20 (×18): 5000 [IU] via SUBCUTANEOUS
  Filled 2024-04-14 (×18): qty 1

## 2024-04-14 MED ORDER — LINACLOTIDE 145 MCG PO CAPS
145.0000 ug | ORAL_CAPSULE | Freq: Every day | ORAL | Status: DC
  Administered 2024-04-15 – 2024-04-23 (×8): 145 ug via ORAL
  Filled 2024-04-14 (×10): qty 1

## 2024-04-14 MED ORDER — OXYCODONE HCL 5 MG PO TABS
10.0000 mg | ORAL_TABLET | Freq: Three times a day (TID) | ORAL | Status: DC | PRN
  Administered 2024-04-14 – 2024-04-21 (×13): 10 mg via ORAL
  Filled 2024-04-14 (×13): qty 2

## 2024-04-14 MED ORDER — SENNOSIDES-DOCUSATE SODIUM 8.6-50 MG PO TABS
1.0000 | ORAL_TABLET | Freq: Two times a day (BID) | ORAL | Status: DC
  Administered 2024-04-14 – 2024-04-23 (×16): 1 via ORAL
  Filled 2024-04-14 (×17): qty 1

## 2024-04-14 MED ORDER — LEVOTHYROXINE SODIUM 50 MCG PO TABS
175.0000 ug | ORAL_TABLET | Freq: Every day | ORAL | Status: DC
  Administered 2024-04-15 – 2024-04-22 (×8): 175 ug via ORAL
  Filled 2024-04-14 (×9): qty 1

## 2024-04-14 MED ORDER — MELATONIN 5 MG PO TABS
10.0000 mg | ORAL_TABLET | Freq: Every day | ORAL | Status: DC
  Administered 2024-04-14 – 2024-04-21 (×8): 10 mg via ORAL
  Filled 2024-04-14 (×8): qty 2

## 2024-04-14 MED ORDER — INSULIN ASPART 100 UNIT/ML IJ SOLN
0.0000 [IU] | Freq: Three times a day (TID) | INTRAMUSCULAR | Status: DC
  Administered 2024-04-15 (×2): 1 [IU] via SUBCUTANEOUS
  Administered 2024-04-15 – 2024-04-16 (×2): 3 [IU] via SUBCUTANEOUS
  Administered 2024-04-16 – 2024-04-17 (×3): 2 [IU] via SUBCUTANEOUS
  Administered 2024-04-17: 3 [IU] via SUBCUTANEOUS
  Administered 2024-04-17 – 2024-04-18 (×3): 2 [IU] via SUBCUTANEOUS
  Administered 2024-04-18: 3 [IU] via SUBCUTANEOUS
  Administered 2024-04-19: 5 [IU] via SUBCUTANEOUS
  Administered 2024-04-19: 7 [IU] via SUBCUTANEOUS
  Administered 2024-04-19: 3 [IU] via SUBCUTANEOUS
  Administered 2024-04-20: 7 [IU] via SUBCUTANEOUS
  Administered 2024-04-20: 5 [IU] via SUBCUTANEOUS
  Administered 2024-04-20: 3 [IU] via SUBCUTANEOUS
  Administered 2024-04-21 (×2): 7 [IU] via SUBCUTANEOUS
  Administered 2024-04-21: 5 [IU] via SUBCUTANEOUS
  Administered 2024-04-22: 3 [IU] via SUBCUTANEOUS
  Administered 2024-04-22: 5 [IU] via SUBCUTANEOUS
  Administered 2024-04-22: 3 [IU] via SUBCUTANEOUS
  Administered 2024-04-24: 2 [IU] via SUBCUTANEOUS
  Administered 2024-04-24: 1 [IU] via SUBCUTANEOUS
  Administered 2024-04-24: 2 [IU] via SUBCUTANEOUS
  Administered 2024-04-25: 3 [IU] via SUBCUTANEOUS
  Administered 2024-04-25: 2 [IU] via SUBCUTANEOUS
  Administered 2024-04-25: 3 [IU] via SUBCUTANEOUS
  Administered 2024-04-26: 2 [IU] via SUBCUTANEOUS
  Administered 2024-04-26 (×2): 3 [IU] via SUBCUTANEOUS
  Administered 2024-04-27 (×2): 2 [IU] via SUBCUTANEOUS
  Administered 2024-04-27: 1 [IU] via SUBCUTANEOUS
  Administered 2024-04-28 – 2024-04-30 (×5): 2 [IU] via SUBCUTANEOUS
  Administered 2024-04-30: 3 [IU] via SUBCUTANEOUS
  Administered 2024-05-01: 2 [IU] via SUBCUTANEOUS
  Filled 2024-04-14 (×43): qty 1

## 2024-04-14 MED ORDER — DOXEPIN HCL 6 MG PO TABS: 6.0000 mg | ORAL_TABLET | Freq: Every day | ORAL | Status: DC

## 2024-04-14 MED ORDER — POLYETHYLENE GLYCOL 3350 17 G PO PACK
34.0000 g | PACK | ORAL | Status: DC
  Administered 2024-04-15 (×3): 34 g via ORAL
  Filled 2024-04-14 (×3): qty 2

## 2024-04-14 MED ORDER — METOPROLOL SUCCINATE ER 25 MG PO TB24
25.0000 mg | ORAL_TABLET | Freq: Every day | ORAL | Status: DC
  Administered 2024-04-14 – 2024-04-23 (×9): 25 mg via ORAL
  Filled 2024-04-14 (×10): qty 1

## 2024-04-14 MED ORDER — SODIUM CHLORIDE 0.9 % IV SOLN: 2.0000 g | Freq: Once | INTRAVENOUS | Status: DC

## 2024-04-14 MED ORDER — POLYETHYLENE GLYCOL 3350 17 G PO PACK
34.0000 g | PACK | ORAL | Status: AC
  Administered 2024-04-14 (×4): 34 g via ORAL
  Filled 2024-04-14 (×4): qty 2

## 2024-04-14 MED ORDER — HYDROCORTISONE 1 % EX CREA
1.0000 | TOPICAL_CREAM | Freq: Three times a day (TID) | CUTANEOUS | Status: DC | PRN
  Administered 2024-04-19: 1 via TOPICAL
  Filled 2024-04-14 (×3): qty 28

## 2024-04-14 MED ORDER — GABAPENTIN 300 MG PO CAPS
300.0000 mg | ORAL_CAPSULE | Freq: Every day | ORAL | Status: DC
  Administered 2024-04-14 – 2024-04-21 (×8): 300 mg via ORAL
  Filled 2024-04-14 (×8): qty 1

## 2024-04-14 MED ORDER — TRAZODONE HCL 100 MG PO TABS
100.0000 mg | ORAL_TABLET | Freq: Every evening | ORAL | Status: DC | PRN
  Administered 2024-04-14 – 2024-04-19 (×6): 100 mg via ORAL
  Filled 2024-04-14 (×6): qty 1

## 2024-04-14 MED ORDER — SODIUM CHLORIDE 0.9 % IV BOLUS
1000.0000 mL | Freq: Once | INTRAVENOUS | Status: AC
  Administered 2024-04-14: 1000 mL via INTRAVENOUS

## 2024-04-14 NOTE — ED Notes (Addendum)
 Patient requested cortisone cream for his arms.  States he puts it on at his home due to itching.  Provider put in PRN for him.  This RN also advised provider that patient would like someone to look at his arms and see if they can fix the ongoing rash.  Provider stated the provider for tomorrow will be able to look at it.

## 2024-04-14 NOTE — ED Notes (Signed)
Informed RN bed assigned 

## 2024-04-14 NOTE — ED Provider Notes (Signed)
 [facility] Provider Note    Event Date/Time   First MD Initiated Contact with Patient 04/14/24 0700     (approximate)   History   Chief Complaint: Constipation   HPI  Eugene [name] is a 63 y.o. male with a history of hypertension diabetes bilateral BKA neurogenic bladder chronic constipation who was sent to the ED due to no bowel movement for the last 6 days.  Reports some nausea.  Had 1 episode of vomiting after eating 2 days ago but otherwise feels like he has been eating normally.  Reports constipation this is typical for him, and going a week without bowel movement is not unusual.  Takes suppositories and Linzess .  Denies chest pain or shortness of breath or fever.  He does endorse some mild generalized abdominal discomfort.  Patient endorses a history of prior bowel obstruction requiring NGT decompression      Past Medical History:  Diagnosis Date   Anxiety    Depression    Diabetes mellitus without complication (HCC)    Hepatitis C    Hyperlipidemia    IBS (irritable bowel syndrome)    Osteomyelitis [facility])    Spinal stenosis     Current Outpatient Rx   Order #: [id number] Class: Historical Med   Order #: [id number] Class: Historical Med   Order #: [id number] Class: Historical Med   Order #: [id number] Class: Normal   Order #: [id number] Class: Historical Med   Order #: [id number] Class: Historical Med   Order #: [id number] Class: Historical Med   Order #: [id number] Class: Normal   Order #: [id number] Class: Historical Med   Order #: [id number] Class: Normal   Order #: [id number] Class: Historical Med   Order #: [id number] Class: Print   Order #: [id number] Class: Historical Med   Order #: [id number] Class: Historical Med   Order #: [id number] Class: Historical Med   Order #: [id number] Class: No Print   Order #: [id number] Class: Historical Med   Order #: [id number] Class: Historical Med   Order #: [id number] Class: Historical Med   Order #: [id number] Class: Print    Order #: [id number] Class: Historical Med   Order #: [id number] Class: Print   Order #: [id number] Class: OTC   Order #: [id number] Class: Historical Med   Order #: [id number] Class: Historical Med    Past Surgical History:  Procedure Laterality Date   BACK SURGERY     bilateral amputation Bilateral    CENTRAL LINE INSERTION-TUNNELED N/A 02/02/2019   Procedure: CENTRAL LINE INSERTION-TUNNELED;  Surgeon: Marea Selinda RAMAN, MD;  Location: [facility] INVASIVE CV LAB;  Service: Cardiovascular;  Laterality: N/A;   COLONOSCOPY WITH PROPOFOL  N/A 01/12/2020   Procedure: COLONOSCOPY WITH PROPOFOL ;  Surgeon: Jinny Carmine, MD;  Location[facility] ENDOSCOPY;  Service: Endoscopy;  Laterality: N/A;   ESOPHAGOGASTRODUODENOSCOPY (EGD) WITH PROPOFOL  N/A 12/07/2019   Procedure: ESOPHAGOGASTRODUODENOSCOPY (EGD) WITH PROPOFOL ;  Surgeon: Unk Corinn Skiff, MD;  Location: [facility] ENDOSCOPY;  Service: Gastroenterology;  Laterality: N/A;   IR CATHETER TUBE CHANGE  12/04/2019   SPINAL FUSION     THORACIC SPINE SURGERY  01/2019   extensive washout    Physical Exam   Triage Vital Signs: ED Triage Vitals  Encounter Vitals Group     BP 04/14/24 0638 (!) 150/70     Girls Systolic BP Percentile --      Girls Diastolic BP Percentile --      Boys Systolic BP Percentile --      Boys Diastolic BP Percentile --      Pulse Rate 04/14/24 0636 72     Resp 04/14/24 0636 18  Temp 04/14/24 0636 98.6 F (37 C)     Temp Source 04/14/24 0636 Oral     SpO2 04/14/24 0636 97 %     Weight 04/14/24 0638 258 lb 1.6 oz (117.1 kg)     Height --      Head Circumference --      Peak Flow --      Pain Score 04/14/24 0638 0     Pain Loc --      Pain Education --      Exclude from Growth Chart --     Most recent vital signs: Vitals:   04/14/24 0636 04/14/24 0638  BP:  (!) 150/70  Pulse: 72 72  Resp: 18 12  Temp: 98.6 F (37 C)   SpO2: 97% 97%    General: Awake, no distress.  CV:  Good peripheral perfusion.  Regular rate  rhythm Resp:  Normal effort.  Clear to auscultation Abd:  Moderate distention, tympanitic to percussion.  Mild generalized tenderness. Other:  Dry oral mucosa   ED Results / Procedures / Treatments   Labs (all labs ordered are listed, but only abnormal results are displayed) Labs Reviewed  CBC WITH DIFFERENTIAL/PLATELET - Abnormal; Notable for the following components:      Result Value   RBC 3.75 (*)    Hemoglobin 11.2 (*)    HCT 35.1 (*)    All other components within normal limits  COMPREHENSIVE METABOLIC PANEL WITH GFR - Abnormal; Notable for the following components:   Potassium 5.2 (*)    Chloride 112 (*)    CO2 17 (*)    BUN 60 (*)    Creatinine, Ser 4.23 (*)    Calcium  8.2 (*)    Albumin 2.0 (*)    GFR, Estimated 15 (*)    All other components within normal limits  URINALYSIS, W/ REFLEX TO CULTURE (INFECTION SUSPECTED) - Abnormal; Notable for the following components:   Color, Urine YELLOW (*)    APPearance HAZY (*)    Glucose, UA 50 (*)    Hgb urine dipstick MODERATE (*)    Protein, ur >=300 (*)    Leukocytes,Ua MODERATE (*)    Bacteria, UA MANY (*)    All other components within normal limits  URINE CULTURE  LIPASE, BLOOD     EKG    RADIOLOGY CT chest abdomen pelvis interpreted by me, negative for bowel obstruction or perforation.   PROCEDURES:  Procedures   MEDICATIONS ORDERED IN ED: Medications  cefTRIAXone  (ROCEPHIN ) 2 g in sodium chloride  0.9 % 100 mL IVPB (has no administration in time range)  sodium chloride  0.9 % bolus 1,000 mL (1,000 mLs Intravenous New Bag/Given 04/14/24 0754)     IMPRESSION / MDM / ASSESSMENT AND PLAN / ED COURSE  I reviewed the triage vital signs and the nursing notes.  DDx: Constipation, ileus, bowel obstruction, GI perforation, intra-abdominal abscess, volvulus, dehydration, AKI, electrolyte derangement  Patient's presentation is most consistent with acute presentation with potential threat to life or bodily  function.  Patient presents with abdominal distention and constipation..  Does have a history of bowel obstruction, has complicated medical history.  Will obtain CT.   ----------------------------------------- 9:46 AM on 04/14/2024 ----------------------------------------- CT negative for bowel obstruction or other acute findings.  Extensive constipation.  Labs do show AKI and a nongap metabolic acidosis.  Case discussed with hospitalist for further management.  Rocephin  for possible UTI, will need to follow culture given suprapubic cath      FINAL CLINICAL  IMPRESSION(S) / ED DIAGNOSES   Final diagnoses:  AKI (acute kidney injury) (HCC)  Suprapubic catheter (HCC)     Rx / DC Orders   ED Discharge Orders     None        Note:  This document was prepared using Dragon voice recognition software and may include unintentional dictation errors.   Viviann Pastor, MD 04/14/24 [phone]

## 2024-04-14 NOTE — H&P (Signed)
 History and Physical    Eugene [name] FMW:[id number] DOB: 03/19/1961 DOA: 04/14/2024  PCP: [facility]  Patient coming from: SNF LTC  I have personally briefly reviewed patient's old medical records in [facility]  Chief Complaint: concern for blockage.  HPI: Eugene [name] is a 63 y.o. male with medical history significant of DM2, bilateral BKA, hep C, chronic suprapubic cath, recurrent fecal impaction who was sent from SNF due to concern for blockage.    Pt reported the NP at SNF was very concerned about his bowels and said his life was in danger.  Per SNF, pt has not had BM in 6 days, but pt said that's normal for him.  Pt had no sensation in his bladder.  Admitted that he may not get enough hydration.  Had no complaints of his own.    ED Course: initial vitals: afebrile, pulse 72, BP 150/70, RR 12, sating 97% on room air.  Labs notable for potassium 5.2, Cr 4.23, BUN 60, CT a/p with No evidence of bowel obstruction or other acute findings within the abdomen or pelvis but did note Moderate colonic stool burden.  Pt was given 1L NS in the ED and admitted for observation.  Assessment/Plan  AKI --Cr 4.23 on presentation, most likely AKI as last Cr on record was 0.84 back in July 2022.  Likely due to poor hydration and being on lasix . --cont NS@100  for 1 more day --hold home lasix  --monitor Cr  Constipation, chronic --CT a/p showed no acute finding.  Constipation likely due to opioids use and poor hydration.   --cont home Linzess  and Senokot --aggressive Miralax , 34g q2h until large BM achieved.    Chronic suprapubic cath --UA obtained on presentation showed mod leuk and many bacteria, but it appeared to have been collected from pt's Chronic suprapubic cath, which is likely colonized with bacteria.  No signs or symptoms of infection. --hold abx  Hx of DM2 --last A1c 7.9 in July 2022.   --obtain A1c --hold long-acting insulin  for now --ACHS and  SSI  HTN --BP measurements varied.  Per med review, pt was only on lasix  40 mg daily PTA (not on amlodipine  and Toprol ). --start amlodipine  and Toprol  --hold lasix   On chronic opioids --cont home oxycodone  5 mg TID PRN  Hyperkalemia --potassium 5.2, mildly elevated, likely due to AKI. --monitor for now   DVT prophylaxis: Heparin  SQ Code Status: DNR Has DNR document in ACP. Family Communication:   Disposition Plan: SNF LTC  Consults called: none Level of care: Med-Surg   Review of Systems: As per HPI otherwise complete review of systems negative.   Past Medical History:  Diagnosis Date   Anxiety    Depression    Diabetes mellitus without complication (HCC)    Hepatitis C    Hyperlipidemia    IBS (irritable bowel syndrome)    Osteomyelitis (HCC)    Spinal stenosis     Past Surgical History:  Procedure Laterality Date   BACK SURGERY     bilateral amputation Bilateral    CENTRAL LINE INSERTION-TUNNELED N/A 02/02/2019   Procedure: CENTRAL LINE INSERTION-TUNNELED;  Surgeon: Marea Selinda RAMAN, MD;  Location: [facility] INVASIVE CV LAB;  Service: Cardiovascular;  Laterality: N/A;   COLONOSCOPY WITH PROPOFOL  N/A 01/12/2020   Procedure: COLONOSCOPY WITH PROPOFOL ;  Surgeon: Jinny Carmine, MD;  Location: [facility] ENDOSCOPY;  Service: Endoscopy;  Laterality: N/A;   ESOPHAGOGASTRODUODENOSCOPY (EGD) WITH PROPOFOL  N/A 12/07/2019   Procedure: ESOPHAGOGASTRODUODENOSCOPY (EGD) WITH PROPOFOL ;  Surgeon: Unk Corinn Skiff,  MD;  Location: [facility] ENDOSCOPY;  Service: Gastroenterology;  Laterality: N/A;   IR CATHETER TUBE CHANGE  12/04/2019   SPINAL FUSION     THORACIC SPINE SURGERY  01/2019   extensive washout     reports that he has been smoking cigarettes. He has never used smokeless tobacco. He reports that he does not currently use alcohol. He reports that he does not currently use drugs.  No Known Allergies  History reviewed. No pertinent family history.   Prior to Admission medications    Medication Sig Start Date End Date Taking? Authorizing Provider  acetaminophen  (TYLENOL ) 325 MG tablet Take 650 mg by mouth in the morning and at bedtime.   Yes [provider]  bisacodyl  (DULCOLAX) 10 MG suppository Place 10 mg rectally daily as needed for moderate constipation.   Yes [provider]  Doxepin  HCl 6 MG TABS Take 6 mg by mouth at bedtime.   Yes [provider]  furosemide  (LASIX ) 40 MG tablet Take 1 tablet (40 mg total) by mouth daily. 04/25/21  Yes Patel, Sona, MD  gabapentin  (NEURONTIN ) 300 MG capsule Take 300 mg by mouth at bedtime.   Yes [provider]  hydrocortisone  cream 1 % Apply 1 Application topically daily.   Yes [provider]  insulin  glargine (LANTUS ) 100 UNIT/ML injection Inject 0.25 mLs (25 Units total) into the skin daily. Patient taking differently: Inject 40 Units into the skin 2 (two) times daily. 04/25/21  Yes Patel, Sona, MD  insulin  lispro (HUMALOG ) 100 UNIT/ML injection Inject 0-15 Units into the skin 4 (four) times daily -  before meals and at bedtime. <150= 0 units 151-200= 3 units 201-250= 6 units 251-300= 9 units 301-350= 12 units 351-400= 15 units >400 call MD Patient taking differently: Inject 0-15 Units into the skin 4 (four) times daily -  before meals and at bedtime. <150= 0 units 151-200= 3 units 201-250= 6 units 251-300= 9 units 301-350= 12 units 351-400= 15 units >400 call MD   Yes [provider]  lactulose  (CEPHULAC ) 20 g packet Take 20 g by mouth 2 (two) times daily.   Yes [provider]  levofloxacin (LEVAQUIN) 750 MG tablet Take 750 mg by mouth daily.   Yes [provider]  levothyroxine  (SYNTHROID ) 175 MCG tablet Take 175 mcg by mouth at bedtime.   Yes [provider]  linaclotide  (LINZESS ) 145 MCG CAPS capsule Take 1 capsule (145 mcg total) by mouth daily before breakfast. 10/25/19  Yes Danford, Lonni SQUIBB, MD  Melatonin 5 MG TABS Take 10 mg by  mouth at bedtime.   Yes [provider]  ondansetron  (ZOFRAN ) 4 MG tablet Take 4 mg by mouth daily.   Yes [provider]  oxyCODONE  (OXY IR/ROXICODONE ) 5 MG immediate release tablet Take 2 tablets (10 mg total) by mouth every 8 (eight) hours. Patient taking differently: Take 10 mg by mouth in the morning, at noon, and at bedtime. 04/25/21  Yes Patel, Sona, MD  polyethylene glycol (MIRALAX  / GLYCOLAX ) 17 g packet Take 17 g by mouth 2 (two) times daily. Patient taking differently: Take 17 g by mouth daily. 10/24/19  Yes Danford, Lonni SQUIBB, MD  senna-docusate (SENOKOT-S) 8.6-50 MG tablet Take 1 tablet by mouth 2 (two) times daily. 10/24/19  Yes Danford, Lonni SQUIBB, MD  sitaGLIPtin (JANUVIA) 100 MG tablet Take 100 mg by mouth daily.   Yes [provider]  sodium phosphate  Pediatric (FLEET) 3.5-9.5 GM/59ML enema Place 1 enema rectally every other day  as needed for severe constipation.   Yes [provider]  amLODipine  (NORVASC ) 10 MG tablet Take 10 mg by mouth daily. Patient not taking: Reported on 04/14/2024    [provider]  metoprolol  succinate (TOPROL -XL) 25 MG 24 hr tablet Take 25 mg by mouth daily. Patient not taking: Reported on 04/14/2024    [provider]  pregabalin  (LYRICA ) 75 MG capsule Take 75 mg by mouth 2 (two) times daily. Patient not taking: Reported on 04/17/2021  04/20/21  [provider]    Physical Exam: Vitals:   04/14/24 1036 04/14/24 1157 04/14/24 1200 04/14/24 1500  BP: (!) 150/63  (!) 182/82 139/73  Pulse: 95  93 76  Resp:   18 18  Temp:  98.5 F (36.9 C)    TempSrc:  Oral    SpO2:   91% 90%  Weight:        Constitutional: NAD, AAOx3 HEENT: conjunctivae and lids normal, EOMI CV: No cyanosis.   RESP: normal respiratory effort, on RA Extremities: bilateral BKA Neuro: II - XII grossly intact.   Psych: Normal mood and affect.  Appropriate judgement and reason  Labs on Admission: I have personally  reviewed labs and imaging studies  Time spent: 60 minutes  Ellouise Haber MD [facility]  If 7PM-7AM, please contact night-coverage 04/14/2024, 3:21 PM

## 2024-04-14 NOTE — ED Triage Notes (Addendum)
 BIB [facility] from [facility] for CC of no bowel movement x6 days. Pt reports facility did xray that revealed blockage. Denies abd pain. Chronic suprapubic cath in place and intact. A&Ox4.

## 2024-04-15 ENCOUNTER — Encounter: Payer: Self-pay | Admitting: Hospitalist

## 2024-04-15 DIAGNOSIS — E875 Hyperkalemia: Secondary | ICD-10-CM

## 2024-04-15 DIAGNOSIS — E1121 Type 2 diabetes mellitus with diabetic nephropathy: Secondary | ICD-10-CM | POA: Diagnosis not present

## 2024-04-15 DIAGNOSIS — J189 Pneumonia, unspecified organism: Secondary | ICD-10-CM | POA: Diagnosis not present

## 2024-04-15 DIAGNOSIS — Z89511 Acquired absence of right leg below knee: Secondary | ICD-10-CM | POA: Diagnosis not present

## 2024-04-15 DIAGNOSIS — T83511A Infection and inflammatory reaction due to indwelling urethral catheter, initial encounter: Secondary | ICD-10-CM | POA: Diagnosis not present

## 2024-04-15 DIAGNOSIS — Z6841 Body Mass Index (BMI) 40.0 and over, adult: Secondary | ICD-10-CM | POA: Diagnosis not present

## 2024-04-15 DIAGNOSIS — Z66 Do not resuscitate: Secondary | ICD-10-CM | POA: Diagnosis present

## 2024-04-15 DIAGNOSIS — T83518A Infection and inflammatory reaction due to other urinary catheter, initial encounter: Secondary | ICD-10-CM | POA: Diagnosis present

## 2024-04-15 DIAGNOSIS — I130 Hypertensive heart and chronic kidney disease with heart failure and stage 1 through stage 4 chronic kidney disease, or unspecified chronic kidney disease: Secondary | ICD-10-CM | POA: Diagnosis present

## 2024-04-15 DIAGNOSIS — E1122 Type 2 diabetes mellitus with diabetic chronic kidney disease: Secondary | ICD-10-CM | POA: Diagnosis present

## 2024-04-15 DIAGNOSIS — Z9359 Other cystostomy status: Secondary | ICD-10-CM | POA: Diagnosis present

## 2024-04-15 DIAGNOSIS — E1143 Type 2 diabetes mellitus with diabetic autonomic (poly)neuropathy: Secondary | ICD-10-CM | POA: Diagnosis present

## 2024-04-15 DIAGNOSIS — N179 Acute kidney failure, unspecified: Secondary | ICD-10-CM | POA: Diagnosis present

## 2024-04-15 DIAGNOSIS — E874 Mixed disorder of acid-base balance: Secondary | ICD-10-CM | POA: Diagnosis present

## 2024-04-15 DIAGNOSIS — E1165 Type 2 diabetes mellitus with hyperglycemia: Secondary | ICD-10-CM | POA: Diagnosis present

## 2024-04-15 DIAGNOSIS — N3000 Acute cystitis without hematuria: Secondary | ICD-10-CM | POA: Diagnosis not present

## 2024-04-15 DIAGNOSIS — Z89512 Acquired absence of left leg below knee: Secondary | ICD-10-CM | POA: Diagnosis not present

## 2024-04-15 DIAGNOSIS — Z794 Long term (current) use of insulin: Secondary | ICD-10-CM | POA: Diagnosis not present

## 2024-04-15 DIAGNOSIS — K560 Paralytic ileus: Secondary | ICD-10-CM | POA: Diagnosis not present

## 2024-04-15 DIAGNOSIS — K5900 Constipation, unspecified: Secondary | ICD-10-CM | POA: Diagnosis not present

## 2024-04-15 DIAGNOSIS — N390 Urinary tract infection, site not specified: Secondary | ICD-10-CM | POA: Diagnosis present

## 2024-04-15 DIAGNOSIS — Y846 Urinary catheterization as the cause of abnormal reaction of the patient, or of later complication, without mention of misadventure at the time of the procedure: Secondary | ICD-10-CM | POA: Diagnosis present

## 2024-04-15 DIAGNOSIS — I5033 Acute on chronic diastolic (congestive) heart failure: Secondary | ICD-10-CM | POA: Diagnosis not present

## 2024-04-15 DIAGNOSIS — N184 Chronic kidney disease, stage 4 (severe): Secondary | ICD-10-CM | POA: Diagnosis present

## 2024-04-15 DIAGNOSIS — E870 Hyperosmolality and hypernatremia: Secondary | ICD-10-CM | POA: Diagnosis not present

## 2024-04-15 DIAGNOSIS — E1169 Type 2 diabetes mellitus with other specified complication: Secondary | ICD-10-CM | POA: Diagnosis present

## 2024-04-15 DIAGNOSIS — J9601 Acute respiratory failure with hypoxia: Secondary | ICD-10-CM | POA: Diagnosis not present

## 2024-04-15 LAB — GLUCOSE, CAPILLARY
Glucose-Capillary: 135 mg/dL — ABNORMAL HIGH (ref 70–99)
Glucose-Capillary: 203 mg/dL — ABNORMAL HIGH (ref 70–99)
Glucose-Capillary: 145 mg/dL — ABNORMAL HIGH (ref 70–99)
Glucose-Capillary: 166 mg/dL — ABNORMAL HIGH (ref 70–99)

## 2024-04-15 LAB — BASIC METABOLIC PANEL WITH GFR
Anion gap: 7 (ref 5–15)
BUN: 53 mg/dL — ABNORMAL HIGH (ref 8–23)
CO2: 16 mmol/L — ABNORMAL LOW (ref 22–32)
Calcium: 7.7 mg/dL — ABNORMAL LOW (ref 8.9–10.3)
Chloride: 114 mmol/L — ABNORMAL HIGH (ref 98–111)
Creatinine, Ser: 3.88 mg/dL — ABNORMAL HIGH (ref 0.61–1.24)
GFR, Estimated: 17 mL/min — ABNORMAL LOW (ref >60)
Glucose, Bld: 131 mg/dL — ABNORMAL HIGH (ref 70–99)
Potassium: 5.4 mmol/L — ABNORMAL HIGH (ref 3.5–5.1)
Sodium: 137 mmol/L (ref 135–145)

## 2024-04-15 LAB — CBC
HCT: 33.7 % — ABNORMAL LOW (ref 39.0–52.0)
Hemoglobin: 11 g/dL — ABNORMAL LOW (ref 13.0–17.0)
MCH: 29.6 pg (ref 26.0–34.0)
MCHC: 32.6 g/dL (ref 30.0–36.0)
MCV: 90.8 fL (ref 80.0–100.0)
Platelets: 238 K/uL (ref 150–400)
RBC: 3.71 MIL/uL — ABNORMAL LOW (ref 4.22–5.81)
RDW: 13.6 % (ref 11.5–15.5)
WBC: 6.3 K/uL (ref 4.0–10.5)
nRBC: 0 % (ref 0.0–0.2)

## 2024-04-15 LAB — MAGNESIUM: Magnesium: 1.8 mg/dL (ref 1.7–2.4)

## 2024-04-15 LAB — HIV ANTIBODY (ROUTINE TESTING W REFLEX): HIV Screen 4th Generation wRfx: NONREACTIVE

## 2024-04-15 MED ORDER — NICOTINE 21 MG/24HR TD PT24
21.0000 mg | MEDICATED_PATCH | Freq: Every day | TRANSDERMAL | Status: DC
  Administered 2024-04-15 – 2024-05-01 (×16): 21 mg via TRANSDERMAL
  Filled 2024-04-15 (×16): qty 1

## 2024-04-15 MED ORDER — BISACODYL 5 MG PO TBEC
10.0000 mg | DELAYED_RELEASE_TABLET | Freq: Every day | ORAL | Status: DC
  Administered 2024-04-15 – 2024-04-21 (×6): 10 mg via ORAL
  Filled 2024-04-15 (×7): qty 2

## 2024-04-15 MED ORDER — SODIUM BICARBONATE 8.4 % IV SOLN
INTRAVENOUS | Status: DC
  Filled 2024-04-15: qty 1000
  Filled 2024-04-15 (×3): qty 150

## 2024-04-15 MED ORDER — CHLORHEXIDINE GLUCONATE CLOTH 2 % EX PADS
6.0000 | MEDICATED_PAD | Freq: Every day | CUTANEOUS | Status: DC
  Administered 2024-04-16 – 2024-04-29 (×14): 6 via TOPICAL

## 2024-04-15 MED ORDER — BISACODYL 10 MG RE SUPP: 10.0000 mg | Freq: Every day | RECTAL | Status: DC | PRN

## 2024-04-15 MED ORDER — FLEET ENEMA RE ENEM: 1.0000 | ENEMA | Freq: Every day | RECTAL | Status: DC | PRN

## 2024-04-15 NOTE — Plan of Care (Signed)

## 2024-04-15 NOTE — Hospital Course (Addendum)
 63 y.o. male with medical history significant of DM2, bilateral BKA, hep C, chronic suprapubic cath, recurrent fecal impaction who was sent from SNF, found to have AKI.   Assessment and Plan:   Acute kidney injury - Creatinine 4.23 on presentation, unknown current baseline.  Last creatinine on record 0.84 in July 2022.  Showing improvement with IV fluid hydration, creatinine down to 3.59 this morning.  Will recheck BMP in AM.  Hold nephrotoxic agents.  Continue IV fluids.  Recheck BMP in AM.   Mixed acid-base disorder - On presentation bicarb 16, likely acidemia from AKI/CKD.  With possible ketosis.  Responded well to bicarb drip, acidemia appears resolved.  Will discontinue bicarb drip.  Continue NS at 100 mL/h.  Will recheck BMP in AM.   Mild hyperkalemia - Continues to show improvement, K down to 4.7 this a.m.  Likely exacerbated by dehydration and AKI/CKD.  IV fluids on board.  Will recheck BMP and magnesium  in AM.   Chronic constipation - Responded well to scheduled Dulcolax with as needed suppository, as needed enema.  Constipation resolved.   Possible urinary tract infection with chronic suprapubic catheter - Patient with noted dysuria.  UA noting leukocytes, WBCs, bacteria.  Urine culture noting Proteus as well as small colonies of staph aureus.  Suprapubic catheter replaced today.  Transition from ceftriaxone  to Keflex .   diabetes mellitus - A1c 6.0 suggesting excellent control.  Insulin  sliding scale board.   Chronic opioid use - Likely exacerbating AKI and constipation.  Continue home oxycodone  5 mg 3 times daily as needed.   Acute nausea and vomiting - Still having intermittent nausea.  Stated he threw up a sausage this morning but was able to tolerate softer foods.  Etiology may be secondary to CAUTI.  Antiemetics on board.  Treatment as above.

## 2024-04-15 NOTE — Progress Notes (Signed)
 Progress Note   Patient: Eugene [name] FMW:[id number] DOB: 02/14/1961 DOA: 04/14/2024  DOS: the patient was seen and examined on 04/15/2024   Brief hospital course:  63 y.o. male with medical history significant of DM2, bilateral BKA, hep C, chronic suprapubic cath, recurrent fecal impaction who was sent from SNF, found to have AKI.  Assessment and Plan:  Acute kidney injury - Creatinine 4.23 on presentation, unknown current baseline.  Last creatinine on record 0.84 in July 2022.  Showing improvement with IV fluid hydration.  Will recheck BMP in AM.  Hold nephrotoxic agents.  Mixed acid-base disorder - Bicarb 16, likely acidemia from AKI/CKD.  With possible ketosis.  Will initiate bicarb drip at 100 mL/h.  Will recheck BMP in AM.  Mild hyperkalemia - Potassium 5.4 this morning.  Likely exacerbated by dehydration and AKI.  IV fluids on board.  Will recheck BMP and magnesium  in AM.  Chronic constipation - Patient states he has not had a bowel movement in a week.  No abdominal distention or tenderness however.  MiraLAX  on board.  Patient states this likely will not work.  Will increase to Dulcolax scheduled with as needed suppository, as needed enema.  Chronic suprapubic catheter - No signs or symptoms of infection.  UA noting leukocytes, WBCs, bacteria.  Urine culture noting gram-negative rods.  Likely colonization.  Holding antibiotics at this time.  Diabetes mellitus - A1c 6.0 suggesting excellent control.  Insulin  sliding scale board.  Chronic opioid use - Likely exacerbating AKI and constipation.  Continue home oxycodone  5 mg 3 times daily as needed.  Subjective: Patient resting comfortably this morning.  States he feels a bit improved from yesterday.  Denies any fever, chills, chest pain, nausea, vomiting, abdominal pain.  Physical Exam:  Vitals:   04/15/24 0300 04/15/24 0727 04/15/24 0959 04/15/24 1002  BP: (!) 114/58 (!) 152/64 (!) 155/69 (!) 155/69  Pulse: 74 (!) 54  68   Resp: 18 16    Temp: 98.4 F (36.9 C) 98.5 F (36.9 C)    TempSrc:      SpO2: 92% 90%    Weight:      Height:        GENERAL:  Alert, pleasant, no acute distress  HEENT:  EOMI CARDIOVASCULAR:  RRR, no murmurs appreciated RESPIRATORY:  Clear to auscultation, no wheezing, rales, or rhonchi GASTROINTESTINAL:  Soft, nontender, nondistended EXTREMITIES:  No LE edema bilaterally NEURO:  No new focal deficits appreciated SKIN:  No rashes noted PSYCH:  Appropriate mood and affect     Data Reviewed:  Imaging Studies: CT CHEST ABDOMEN PELVIS WO CONTRAST Result Date: 04/14/2024 CLINICAL DATA:  Constipation. Abdominal pain. Dilated bowel loops and abnormal chest radiograph. EXAM: CT CHEST, ABDOMEN AND PELVIS WITHOUT CONTRAST TECHNIQUE: Multidetector CT imaging of the chest, abdomen and pelvis was performed following the standard protocol without IV contrast. RADIATION DOSE REDUCTION: This exam was performed according to the departmental dose-optimization program which includes automated exposure control, adjustment of the mA and/or kV according to patient size and/or use of iterative reconstruction technique. COMPARISON:  AP CT on 04/17/2021 FINDINGS: CT CHEST FINDINGS Cardiovascular: No acute findings. Mediastinum/Lymph Nodes: No masses or pathologically enlarged lymph nodes identified on this unenhanced exam. Lungs/Pleura: Small bilateral pleural effusions of pleural thickening. Bilateral pleural-parenchymal scarring greatest in lower lobes, with areas of rounded atelectasis in both lower lobes. Musculoskeletal: No suspicious bone lesions identified. Thoracic spine fusion hardware noted. CT ABDOMEN AND PELVIS FINDINGS Hepatobiliary: No masses visualized on this unenhanced exam. Mildly  distended gallbladder, without other signs of cholecystitis or biliary ductal dilatation. Pancreas: No mass or inflammatory changes identified on this unenhanced exam. Spleen:  Within normal limits in size.  Adrenals/Urinary Tract: No evidence of urolithiasis or hydronephrosis. Suprapubic bladder catheter in appropriate position. Stomach/Bowel: No evidence of obstruction, inflammatory process, or abnormal fluid collections. Moderate colonic stool burden noted. Small umbilical hernia is seen which contains only fat. Vascular/Lymphatic: No pathologically enlarged lymph nodes identified. No abdominal aortic aneurysm. Reproductive:  No masses or other significant abnormality. Other:  None. Musculoskeletal:  No suspicious bone lesions identified. IMPRESSION: No evidence of bowel obstruction or other acute findings within the abdomen or pelvis. Small bilateral pleural effusions with pleural thickening. Bilateral pleural-parenchymal scarring, with areas of rounded atelectasis in both lower lobes. Small umbilical hernia, which contains only fat. Electronically Signed   By: Norleen DELENA Kil M.D.   On: 04/14/2024 09:11    There are no new results to review at this time.  Previous records (including but not limited to H&P, progress notes, nursing notes, TOC management) were reviewed in assessment of this patient.  Labs: CBC: Recent Labs  Lab 04/14/24 0643 04/15/24 0519  WBC 7.7 6.3  NEUTROABS 4.5  --   HGB 11.2* 11.0*  HCT 35.1* 33.7*  MCV 93.6 90.8  PLT 231 238   Basic Metabolic Panel: Recent Labs  Lab 04/14/24 0643 04/15/24 0519  NA 141 137  K 5.2* 5.4*  CL 112* 114*  CO2 17* 16*  GLUCOSE 86 131*  BUN 60* 53*  CREATININE 4.23* 3.88*  CALCIUM  8.2* 7.7*  MG  --  1.8   Liver Function Tests: Recent Labs  Lab 04/14/24 0643  AST 18  ALT 19  ALKPHOS 58  BILITOT 0.5  PROT 6.5  ALBUMIN 2.0*   CBG: Recent Labs  Lab 04/14/24 2009 04/14/24 2202 04/15/24 0804 04/15/24 1233  GLUCAP 190* 161* 135* 203*    Scheduled Meds:  amLODipine   10 mg Oral Daily   gabapentin   300 mg Oral QHS   heparin   5,000 Units Subcutaneous Q8H   insulin  aspart  0-5 Units Subcutaneous QHS   insulin  aspart  0-9  Units Subcutaneous TID WC   levothyroxine   175 mcg Oral Q0600   linaclotide   145 mcg Oral QAC breakfast   melatonin  10 mg Oral QHS   metoprolol  succinate  25 mg Oral Daily   nicotine   21 mg Transdermal Daily   polyethylene glycol  34 g Oral Q2H   senna-docusate  1 tablet Oral BID   Continuous Infusions:  sodium bicarbonate  150 mEq in dextrose  5 % 1,150 mL infusion 100 mL/hr at 04/15/24 1200   PRN Meds:.hydrocortisone  cream, oxyCODONE , traZODone   Family Communication: Mother bedside  Disposition: Status is: Inpatient Remains inpatient appropriate because: Due to kidney injury     Time spent: 41 minutes  Length of inpatient stay: 0 days  Author: Carliss LELON Canales, DO 04/15/2024 1:04 PM  For on call review [email or web address].

## 2024-04-15 NOTE — Plan of Care (Signed)
 Pt had large bowel movement.     Problem: Education: Goal: Ability to describe self-care measures that may prevent or decrease complications (Diabetes Survival Skills Education) will improve Outcome: Progressing Goal: Individualized Educational Video(s) Outcome: Progressing   Problem: Coping: Goal: Ability to adjust to condition or change in health will improve Outcome: Progressing   Problem: Fluid Volume: Goal: Ability to maintain a balanced intake and output will improve Outcome: Progressing   Problem: Health Behavior/Discharge Planning: Goal: Ability to identify and utilize available resources and services will improve Outcome: Progressing Goal: Ability to manage health-related needs will improve Outcome: Progressing   Problem: Metabolic: Goal: Ability to maintain appropriate glucose levels will improve Outcome: Progressing   Problem: Nutritional: Goal: Maintenance of adequate nutrition will improve Outcome: Progressing Goal: Progress toward achieving an optimal weight will improve Outcome: Progressing   Problem: Skin Integrity: Goal: Risk for impaired skin integrity will decrease Outcome: Progressing   Problem: Tissue Perfusion: Goal: Adequacy of tissue perfusion will improve Outcome: Progressing   Problem: Education: Goal: Knowledge of General Education information will improve Description: Including pain rating scale, medication(s)/side effects and non-pharmacologic comfort measures Outcome: Progressing   Problem: Health Behavior/Discharge Planning: Goal: Ability to manage health-related needs will improve Outcome: Progressing   Problem: Clinical Measurements: Goal: Ability to maintain clinical measurements within normal limits will improve Outcome: Progressing Goal: Will remain free from infection Outcome: Progressing Goal: Diagnostic test results will improve Outcome: Progressing Goal: Respiratory complications will improve Outcome:  Progressing Goal: Cardiovascular complication will be avoided Outcome: Progressing   Problem: Activity: Goal: Risk for activity intolerance will decrease Outcome: Progressing   Problem: Nutrition: Goal: Adequate nutrition will be maintained Outcome: Progressing   Problem: Coping: Goal: Level of anxiety will decrease Outcome: Progressing   Problem: Elimination: Goal: Will not experience complications related to bowel motility Outcome: Progressing Goal: Will not experience complications related to urinary retention Outcome: Progressing   Problem: Pain Managment: Goal: General experience of comfort will improve and/or be controlled Outcome: Progressing   Problem: Safety: Goal: Ability to remain free from injury will improve Outcome: Progressing   Problem: Skin Integrity: Goal: Risk for impaired skin integrity will decrease Outcome: Progressing

## 2024-04-16 DIAGNOSIS — E874 Mixed disorder of acid-base balance: Secondary | ICD-10-CM

## 2024-04-16 DIAGNOSIS — N3000 Acute cystitis without hematuria: Secondary | ICD-10-CM

## 2024-04-16 DIAGNOSIS — N179 Acute kidney failure, unspecified: Secondary | ICD-10-CM | POA: Diagnosis not present

## 2024-04-16 DIAGNOSIS — Z9359 Other cystostomy status: Secondary | ICD-10-CM | POA: Diagnosis not present

## 2024-04-16 LAB — GLUCOSE, CAPILLARY
Glucose-Capillary: 198 mg/dL — ABNORMAL HIGH (ref 70–99)
Glucose-Capillary: 230 mg/dL — ABNORMAL HIGH (ref 70–99)
Glucose-Capillary: 211 mg/dL — ABNORMAL HIGH (ref 70–99)
Glucose-Capillary: 193 mg/dL — ABNORMAL HIGH (ref 70–99)
Glucose-Capillary: 182 mg/dL — ABNORMAL HIGH (ref 70–99)

## 2024-04-16 LAB — CBC
HCT: 30.7 % — ABNORMAL LOW (ref 39.0–52.0)
Hemoglobin: 10 g/dL — ABNORMAL LOW (ref 13.0–17.0)
MCH: 29.6 pg (ref 26.0–34.0)
MCHC: 32.6 g/dL (ref 30.0–36.0)
MCV: 90.8 fL (ref 80.0–100.0)
Platelets: 198 K/uL (ref 150–400)
RBC: 3.38 MIL/uL — ABNORMAL LOW (ref 4.22–5.81)
RDW: 13.6 % (ref 11.5–15.5)
WBC: 5.5 K/uL (ref 4.0–10.5)
nRBC: 0 % (ref 0.0–0.2)

## 2024-04-16 LAB — BASIC METABOLIC PANEL WITH GFR
Anion gap: 8 (ref 5–15)
BUN: 52 mg/dL — ABNORMAL HIGH (ref 8–23)
CO2: 20 mmol/L — ABNORMAL LOW (ref 22–32)
Calcium: 7.5 mg/dL — ABNORMAL LOW (ref 8.9–10.3)
Chloride: 112 mmol/L — ABNORMAL HIGH (ref 98–111)
Creatinine, Ser: 3.82 mg/dL — ABNORMAL HIGH (ref 0.61–1.24)
GFR, Estimated: 17 mL/min — ABNORMAL LOW (ref >60)
Glucose, Bld: 214 mg/dL — ABNORMAL HIGH (ref 70–99)
Potassium: 5.4 mmol/L — ABNORMAL HIGH (ref 3.5–5.1)
Sodium: 140 mmol/L (ref 135–145)

## 2024-04-16 LAB — MAGNESIUM: Magnesium: 1.8 mg/dL (ref 1.7–2.4)

## 2024-04-16 LAB — PHOSPHORUS: Phosphorus: 4.8 mg/dL — ABNORMAL HIGH (ref 2.5–4.6)

## 2024-04-16 MED ORDER — ONDANSETRON HCL 4 MG/2ML IJ SOLN
4.0000 mg | Freq: Four times a day (QID) | INTRAMUSCULAR | Status: DC | PRN
  Administered 2024-04-16 – 2024-05-01 (×35): 4 mg via INTRAVENOUS
  Filled 2024-04-16 (×38): qty 2

## 2024-04-16 MED ORDER — SODIUM CHLORIDE 0.9 % IV SOLN
1.0000 g | INTRAVENOUS | Status: DC
  Administered 2024-04-16 – 2024-04-17 (×2): 1 g via INTRAVENOUS
  Filled 2024-04-16 (×2): qty 10

## 2024-04-16 NOTE — Progress Notes (Signed)
 Progress Note   Patient: Eugene [name] FMW:[id number] DOB: 1961-06-24 DOA: 04/14/2024  DOS: the patient was seen and examined on 04/16/2024   Brief hospital course:  63 y.o. male with medical history significant of DM2, bilateral BKA, hep C, chronic suprapubic cath, recurrent fecal impaction who was sent from SNF, found to have AKI.   Assessment and Plan:   Acute kidney injury - Creatinine 4.23 on presentation, unknown current baseline.  Last creatinine on record 0.84 in July 2022.  Showing improvement with IV fluid hydration, creatinine down to 3.82.  Will recheck BMP in AM.  Hold nephrotoxic agents.   Mixed acid-base disorder - On presentation, bicarb 16, likely acidemia from AKI/CKD.  Showing improvement.  Continue bicarb drip at 100 mL/h.  Will recheck BMP in AM.   Mild hyperkalemia - Potassium 5.4 again this morning.  Likely exacerbated by dehydration and AKI/CKD.  IV fluids on board.  Will recheck BMP and magnesium  in AM.   Chronic constipation - Patient stated he had not had a bowel movement in a week.  No abdominal distention or tenderness however.  Responded well to scheduled Dulcolax with as needed suppository, as needed enema.  Constipation resolved.   Chronic suprapubic catheter - No signs or symptoms of infection.  UA noting leukocytes, WBCs, bacteria.  Urine culture noting gram-negative rods.  Likely colonization.  Holding antibiotics at this time.   Diabetes mellitus - A1c 6.0 suggesting excellent control.  Insulin  sliding scale board.   Chronic opioid use - Likely exacerbating AKI and constipation.  Continue home oxycodone  5 mg 3 times daily as needed.  Possible urinary tract infection - UA noted LE, WBCs, bacteria.  Patient this morning complaining of some burning and urgency.  Will initiate IV ceftriaxone  1 g every 24 hours.  Monitor urine culture results.   Subjective: Patient resting comfortably's morning.  States he feels well.  Does complain of some  burning/urgency despite having suprapubic catheter.  Denies any fever, chills, chest pain, nausea, vomiting, abdominal pain.  Physical Exam:  Vitals:   04/15/24 2059 04/16/24 0427 04/16/24 0735 04/16/24 0739  BP: (!) 132/55 (!) 153/75 (!) 155/79 (!) 155/79  Pulse: (!) 55 (!) 52 (!) 35 (!) 34  Resp: 18 18    Temp: 98.2 F (36.8 C) 98 F (36.7 C) 98 F (36.7 C) 98 F (36.7 C)  TempSrc: Oral  Oral Oral  SpO2: 91% 90% 92% 90%  Weight:      Height:        GENERAL:  Alert, pleasant, no acute distress, obese HEENT:  EOMI CARDIOVASCULAR:  RRR, no murmurs appreciated RESPIRATORY:  Clear to auscultation, no wheezing, rales, or rhonchi GASTROINTESTINAL:  Soft, nontender, nondistended, suprapubic catheter EXTREMITIES:  No LE edema bilaterally NEURO:  No new focal deficits appreciated SKIN:  No rashes noted PSYCH:  Appropriate mood and affect    Data Reviewed:  Imaging Studies: CT CHEST ABDOMEN PELVIS WO CONTRAST Result Date: 04/14/2024 CLINICAL DATA:  Constipation. Abdominal pain. Dilated bowel loops and abnormal chest radiograph. EXAM: CT CHEST, ABDOMEN AND PELVIS WITHOUT CONTRAST TECHNIQUE: Multidetector CT imaging of the chest, abdomen and pelvis was performed following the standard protocol without IV contrast. RADIATION DOSE REDUCTION: This exam was performed according to the departmental dose-optimization program which includes automated exposure control, adjustment of the mA and/or kV according to patient size and/or use of iterative reconstruction technique. COMPARISON:  AP CT on 04/17/2021 FINDINGS: CT CHEST FINDINGS Cardiovascular: No acute findings. Mediastinum/Lymph Nodes: No masses or pathologically enlarged  lymph nodes identified on this unenhanced exam. Lungs/Pleura: Small bilateral pleural effusions of pleural thickening. Bilateral pleural-parenchymal scarring greatest in lower lobes, with areas of rounded atelectasis in both lower lobes. Musculoskeletal: No suspicious bone  lesions identified. Thoracic spine fusion hardware noted. CT ABDOMEN AND PELVIS FINDINGS Hepatobiliary: No masses visualized on this unenhanced exam. Mildly distended gallbladder, without other signs of cholecystitis or biliary ductal dilatation. Pancreas: No mass or inflammatory changes identified on this unenhanced exam. Spleen:  Within normal limits in size. Adrenals/Urinary Tract: No evidence of urolithiasis or hydronephrosis. Suprapubic bladder catheter in appropriate position. Stomach/Bowel: No evidence of obstruction, inflammatory process, or abnormal fluid collections. Moderate colonic stool burden noted. Small umbilical hernia is seen which contains only fat. Vascular/Lymphatic: No pathologically enlarged lymph nodes identified. No abdominal aortic aneurysm. Reproductive:  No masses or other significant abnormality. Other:  None. Musculoskeletal:  No suspicious bone lesions identified. IMPRESSION: No evidence of bowel obstruction or other acute findings within the abdomen or pelvis. Small bilateral pleural effusions with pleural thickening. Bilateral pleural-parenchymal scarring, with areas of rounded atelectasis in both lower lobes. Small umbilical hernia, which contains only fat. Electronically Signed   By: Norleen DELENA Kil M.D.   On: 04/14/2024 09:11    There are no new results to review at this time.  Previous records (including but not limited to H&P, progress notes, nursing notes, TOC management) were reviewed in assessment of this patient.  Labs: CBC: Recent Labs  Lab 04/14/24 0643 04/15/24 0519 04/16/24 0357  WBC 7.7 6.3 5.5  NEUTROABS 4.5  --   --   HGB 11.2* 11.0* 10.0*  HCT 35.1* 33.7* 30.7*  MCV 93.6 90.8 90.8  PLT 231 238 198   Basic Metabolic Panel: Recent Labs  Lab 04/14/24 0643 04/15/24 0519 04/16/24 0357  NA 141 137 140  K 5.2* 5.4* 5.4*  CL 112* 114* 112*  CO2 17* 16* 20*  GLUCOSE 86 131* 214*  BUN 60* 53* 52*  CREATININE 4.23* 3.88* 3.82*  CALCIUM  8.2* 7.7*  7.5*  MG  --  1.8 1.8  PHOS  --   --  4.8*   Liver Function Tests: Recent Labs  Lab 04/14/24 0643  AST 18  ALT 19  ALKPHOS 58  BILITOT 0.5  PROT 6.5  ALBUMIN 2.0*   CBG: Recent Labs  Lab 04/15/24 0804 04/15/24 1233 04/15/24 1637 04/15/24 2059 04/16/24 0731  GLUCAP 135* 203* 145* 166* 198*    Scheduled Meds:  amLODipine   10 mg Oral Daily   bisacodyl   10 mg Oral Daily   Chlorhexidine  Gluconate Cloth  6 each Topical Q0600   gabapentin   300 mg Oral QHS   heparin   5,000 Units Subcutaneous Q8H   insulin  aspart  0-5 Units Subcutaneous QHS   insulin  aspart  0-9 Units Subcutaneous TID WC   levothyroxine   175 mcg Oral Q0600   linaclotide   145 mcg Oral QAC breakfast   melatonin  10 mg Oral QHS   metoprolol  succinate  25 mg Oral Daily   nicotine   21 mg Transdermal Daily   senna-docusate  1 tablet Oral BID   Continuous Infusions:  sodium bicarbonate  150 mEq in dextrose  5 % 1,150 mL infusion 100 mL/hr at 04/15/24 2343   PRN Meds:.bisacodyl , hydrocortisone  cream, oxyCODONE , sodium phosphate , traZODone   Family Communication: None at bedside  Disposition: Status is: Inpatient Remains inpatient appropriate because: Acute kidney injury     Time spent: 37 minutes  Length of inpatient stay: 1 days  Author: Carliss LELON Canales,  DO 04/16/2024 10:57 AM  For on call review [email or web address].

## 2024-04-16 NOTE — Plan of Care (Signed)
  Problem: Metabolic: Goal: Ability to maintain appropriate glucose levels will improve Outcome: Progressing   Problem: Clinical Measurements: Goal: Diagnostic test results will improve Outcome: Progressing   Problem: Coping: Goal: Level of anxiety will decrease Outcome: Progressing

## 2024-04-17 DIAGNOSIS — N3000 Acute cystitis without hematuria: Secondary | ICD-10-CM | POA: Diagnosis not present

## 2024-04-17 DIAGNOSIS — N179 Acute kidney failure, unspecified: Secondary | ICD-10-CM | POA: Diagnosis not present

## 2024-04-17 DIAGNOSIS — E874 Mixed disorder of acid-base balance: Secondary | ICD-10-CM | POA: Diagnosis not present

## 2024-04-17 DIAGNOSIS — Z9359 Other cystostomy status: Secondary | ICD-10-CM | POA: Diagnosis not present

## 2024-04-17 LAB — CBC
HCT: 30.6 % — ABNORMAL LOW (ref 39.0–52.0)
Hemoglobin: 10 g/dL — ABNORMAL LOW (ref 13.0–17.0)
MCH: 29.6 pg (ref 26.0–34.0)
MCHC: 32.7 g/dL (ref 30.0–36.0)
MCV: 90.5 fL (ref 80.0–100.0)
Platelets: 168 K/uL (ref 150–400)
RBC: 3.38 MIL/uL — ABNORMAL LOW (ref 4.22–5.81)
RDW: 13.6 % (ref 11.5–15.5)
WBC: 5.6 K/uL (ref 4.0–10.5)
nRBC: 0 % (ref 0.0–0.2)

## 2024-04-17 LAB — BASIC METABOLIC PANEL WITH GFR
Anion gap: 5 (ref 5–15)
BUN: 51 mg/dL — ABNORMAL HIGH (ref 8–23)
CO2: 24 mmol/L (ref 22–32)
Calcium: 7.2 mg/dL — ABNORMAL LOW (ref 8.9–10.3)
Chloride: 111 mmol/L (ref 98–111)
Creatinine, Ser: 3.69 mg/dL — ABNORMAL HIGH (ref 0.61–1.24)
GFR, Estimated: 18 mL/min — ABNORMAL LOW (ref >60)
Glucose, Bld: 213 mg/dL — ABNORMAL HIGH (ref 70–99)
Potassium: 5 mmol/L (ref 3.5–5.1)
Sodium: 140 mmol/L (ref 135–145)

## 2024-04-17 LAB — GLUCOSE, CAPILLARY
Glucose-Capillary: 179 mg/dL — ABNORMAL HIGH (ref 70–99)
Glucose-Capillary: 208 mg/dL — ABNORMAL HIGH (ref 70–99)
Glucose-Capillary: 185 mg/dL — ABNORMAL HIGH (ref 70–99)
Glucose-Capillary: 161 mg/dL — ABNORMAL HIGH (ref 70–99)
Glucose-Capillary: 204 mg/dL — ABNORMAL HIGH (ref 70–99)

## 2024-04-17 LAB — URINE CULTURE: Culture: >=100000 — AB

## 2024-04-17 LAB — PHOSPHORUS: Phosphorus: 4.5 mg/dL (ref 2.5–4.6)

## 2024-04-17 LAB — MAGNESIUM: Magnesium: 1.9 mg/dL (ref 1.7–2.4)

## 2024-04-17 MED ORDER — SODIUM CHLORIDE 0.9 % IV SOLN: INTRAVENOUS | Status: AC

## 2024-04-17 MED ORDER — CEPHALEXIN 500 MG PO CAPS
500.0000 mg | ORAL_CAPSULE | Freq: Three times a day (TID) | ORAL | Status: DC
  Administered 2024-04-17 – 2024-04-22 (×14): 500 mg via ORAL
  Filled 2024-04-17 (×14): qty 1

## 2024-04-17 NOTE — Plan of Care (Signed)

## 2024-04-17 NOTE — Inpatient Diabetes Management (Signed)
 Inpatient Diabetes Program Recommendations  AACE/ADA: New Consensus Statement on Inpatient Glycemic Control   Target Ranges:  Prepandial:   less than 140 mg/dL      Peak postprandial:   less than 180 mg/dL (1-2 hours)      Critically ill patients:  140 - 180 mg/dL    Latest Reference Range & Units 04/16/24 07:31 04/16/24 11:32 04/16/24 15:25 04/16/24 16:49 04/16/24 21:35 04/17/24 07:30  Glucose-Capillary 70 - 99 mg/dL 801 (H) 769 (H) 788 (H) 193 (H) 182 (H) 179 (H)   Review of Glycemic Control  Diabetes history: DM2 Outpatient Diabetes medications: Lantus  40 units BID, Humalog  0-15 units QID, Januvia 100 mg daily Current orders for Inpatient glycemic control: Novolog  0-9 units TID with meals, Novolog  0-5 units QHS  Inpatient Diabetes Program Recommendations:    Insulin : Please consider ordering Novolog  3 units TID with meals for meal coverage if patient eats at least 50% of meals.  Thanks, Earnie Gainer, RN, MSN, CDCES Diabetes Coordinator Inpatient Diabetes Program [phone] (Team Pager from 8am to 5pm)

## 2024-04-17 NOTE — Progress Notes (Signed)
 Progress Note   Patient: Eugene [name] FMW:[id number] DOB: 1960-10-02 DOA: 04/14/2024  DOS: the patient was seen and examined on 04/17/2024   Brief hospital course:  63 y.o. male with medical history significant of DM2, bilateral BKA, hep C, chronic suprapubic cath, recurrent fecal impaction who was sent from SNF, found to have AKI.   Assessment and Plan:   Acute kidney injury - Creatinine 4.23 on presentation, unknown current baseline.  Last creatinine on record 0.84 in July 2022.  Showing improvement with IV fluid hydration, creatinine down to 3.69 this morning.  Will recheck BMP in AM.  Hold nephrotoxic agents.   Mixed acid-base disorder - On presentation bicarb 16, likely acidemia from AKI/CKD.  With possible ketosis.  Responded well to bicarb drip, acidemia appears resolved.  Will discontinue bicarb drip.  Initiate NS at 100 mL/h.  Will recheck BMP in AM.   Mild hyperkalemia - Potassium 5.0 this morning.  Improved from yesterday.  Likely exacerbated by dehydration and AKI.  IV fluids on board.  Will recheck BMP and magnesium  in AM.   Chronic constipation - Patient had not had a bowel movement in a week.  No abdominal distention or tenderness however.  Responded well to scheduled Dulcolax with as needed suppository, as needed enema.  Constipation resolved.   Possible urinary tract infection with chronic suprapubic catheter - No signs or symptoms of infection.  UA noting leukocytes, WBCs, bacteria.  Urine culture noting gram-negative rods.  Urine culture noting Proteus.  Continues on IV ceftriaxone  every 24 hours.   Diabetes mellitus - A1c 6.0 suggesting excellent control.  Insulin  sliding scale board.   Chronic opioid use - Likely exacerbating AKI and constipation.  Continue home oxycodone  5 mg 3 times daily as needed.  Acute nausea and vomiting - Etiology unclear.  May be secondary to food/hamburger yesterday at lunch.  Antiemetics on board.  IV fluids in place.  Will monitor  for now.  Other etiologies include gastroparesis, gastroenteritis.  Subjective: Patient not feeling well this morning.  States after lunch yesterday had episodes of nausea and vomiting.  Continues to be nauseous this morning.  Was motivated to possibly be discharged today however his since felt unwell.  Denies any fever, chest pain, shortness of breath, abdominal pain, diarrhea.  Physical Exam:  Vitals:   04/16/24 2037 04/16/24 2041 04/17/24 0435 04/17/24 0733  BP: (!) 151/74  116/64 137/60  Pulse: (!) 42 (!) 55 92   Resp: 12  10 20   Temp:    98 F (36.7 C)  TempSrc:    Oral  SpO2: 90%  95% (!) 80%  Weight:      Height:        GENERAL:  Alert, pleasant, malaise, obese HEENT:  EOMI CARDIOVASCULAR:  RRR, no murmurs appreciated RESPIRATORY:  Clear to auscultation, no wheezing, rales, or rhonchi GASTROINTESTINAL:  Soft, nontender, nondistended, suprapubic catheter EXTREMITIES:  No LE edema bilaterally NEURO:  No new focal deficits appreciated SKIN:  No rashes noted PSYCH:  Appropriate mood and affect    Data Reviewed:  Imaging Studies: CT CHEST ABDOMEN PELVIS WO CONTRAST Result Date: 04/14/2024 CLINICAL DATA:  Constipation. Abdominal pain. Dilated bowel loops and abnormal chest radiograph. EXAM: CT CHEST, ABDOMEN AND PELVIS WITHOUT CONTRAST TECHNIQUE: Multidetector CT imaging of the chest, abdomen and pelvis was performed following the standard protocol without IV contrast. RADIATION DOSE REDUCTION: This exam was performed according to the departmental dose-optimization program which includes automated exposure control, adjustment of the mA and/or kV according  to patient size and/or use of iterative reconstruction technique. COMPARISON:  AP CT on 04/17/2021 FINDINGS: CT CHEST FINDINGS Cardiovascular: No acute findings. Mediastinum/Lymph Nodes: No masses or pathologically enlarged lymph nodes identified on this unenhanced exam. Lungs/Pleura: Small bilateral pleural effusions of pleural  thickening. Bilateral pleural-parenchymal scarring greatest in lower lobes, with areas of rounded atelectasis in both lower lobes. Musculoskeletal: No suspicious bone lesions identified. Thoracic spine fusion hardware noted. CT ABDOMEN AND PELVIS FINDINGS Hepatobiliary: No masses visualized on this unenhanced exam. Mildly distended gallbladder, without other signs of cholecystitis or biliary ductal dilatation. Pancreas: No mass or inflammatory changes identified on this unenhanced exam. Spleen:  Within normal limits in size. Adrenals/Urinary Tract: No evidence of urolithiasis or hydronephrosis. Suprapubic bladder catheter in appropriate position. Stomach/Bowel: No evidence of obstruction, inflammatory process, or abnormal fluid collections. Moderate colonic stool burden noted. Small umbilical hernia is seen which contains only fat. Vascular/Lymphatic: No pathologically enlarged lymph nodes identified. No abdominal aortic aneurysm. Reproductive:  No masses or other significant abnormality. Other:  None. Musculoskeletal:  No suspicious bone lesions identified. IMPRESSION: No evidence of bowel obstruction or other acute findings within the abdomen or pelvis. Small bilateral pleural effusions with pleural thickening. Bilateral pleural-parenchymal scarring, with areas of rounded atelectasis in both lower lobes. Small umbilical hernia, which contains only fat. Electronically Signed   By: Norleen DELENA Kil M.D.   On: 04/14/2024 09:11    No new imaging  Previous records (including but not limited to H&P, progress notes, nursing notes, TOC management) were reviewed in assessment of this patient.  Labs: CBC: Recent Labs  Lab 04/14/24 0643 04/15/24 0519 04/16/24 0357 04/17/24 0442  WBC 7.7 6.3 5.5 5.6  NEUTROABS 4.5  --   --   --   HGB 11.2* 11.0* 10.0* 10.0*  HCT 35.1* 33.7* 30.7* 30.6*  MCV 93.6 90.8 90.8 90.5  PLT 231 238 198 168   Basic Metabolic Panel: Recent Labs  Lab 04/14/24 0643 04/15/24 0519  04/16/24 0357 04/17/24 0442  NA 141 137 140 140  K 5.2* 5.4* 5.4* 5.0  CL 112* 114* 112* 111  CO2 17* 16* 20* 24  GLUCOSE 86 131* 214* 213*  BUN 60* 53* 52* 51*  CREATININE 4.23* 3.88* 3.82* 3.69*  CALCIUM  8.2* 7.7* 7.5* 7.2*  MG  --  1.8 1.8 1.9  PHOS  --   --  4.8* 4.5   Liver Function Tests: Recent Labs  Lab 04/14/24 0643  AST 18  ALT 19  ALKPHOS 58  BILITOT 0.5  PROT 6.5  ALBUMIN 2.0*   CBG: Recent Labs  Lab 04/16/24 1132 04/16/24 1525 04/16/24 1649 04/16/24 2135 04/17/24 0730  GLUCAP 230* 211* 193* 182* 179*    Scheduled Meds:  amLODipine   10 mg Oral Daily   bisacodyl   10 mg Oral Daily   Chlorhexidine  Gluconate Cloth  6 each Topical Q0600   gabapentin   300 mg Oral QHS   heparin   5,000 Units Subcutaneous Q8H   insulin  aspart  0-5 Units Subcutaneous QHS   insulin  aspart  0-9 Units Subcutaneous TID WC   levothyroxine   175 mcg Oral Q0600   linaclotide   145 mcg Oral QAC breakfast   melatonin  10 mg Oral QHS   metoprolol  succinate  25 mg Oral Daily   nicotine   21 mg Transdermal Daily   senna-docusate  1 tablet Oral BID   Continuous Infusions:  sodium chloride      cefTRIAXone  (ROCEPHIN )  IV 1 g (04/17/24 1117)   PRN Meds:.bisacodyl , hydrocortisone   cream, ondansetron  (ZOFRAN ) IV, oxyCODONE , sodium phosphate , traZODone   Family Communication: None at bedside  Disposition: Status is: Inpatient Remains inpatient appropriate because: Acute kidney injury     Time spent: 38 minutes  Length of inpatient stay: 2 days  Author: Carliss LELON Canales, DO 04/17/2024 11:42 AM  For on call review [email or web address].

## 2024-04-18 DIAGNOSIS — Z9359 Other cystostomy status: Secondary | ICD-10-CM | POA: Diagnosis not present

## 2024-04-18 DIAGNOSIS — E874 Mixed disorder of acid-base balance: Secondary | ICD-10-CM | POA: Diagnosis not present

## 2024-04-18 DIAGNOSIS — N179 Acute kidney failure, unspecified: Secondary | ICD-10-CM | POA: Diagnosis not present

## 2024-04-18 DIAGNOSIS — T83511A Infection and inflammatory reaction due to indwelling urethral catheter, initial encounter: Secondary | ICD-10-CM | POA: Diagnosis not present

## 2024-04-18 LAB — BASIC METABOLIC PANEL WITH GFR
Anion gap: 8 (ref 5–15)
BUN: 45 mg/dL — ABNORMAL HIGH (ref 8–23)
CO2: 21 mmol/L — ABNORMAL LOW (ref 22–32)
Calcium: 7 mg/dL — ABNORMAL LOW (ref 8.9–10.3)
Chloride: 111 mmol/L (ref 98–111)
Creatinine, Ser: 3.59 mg/dL — ABNORMAL HIGH (ref 0.61–1.24)
GFR, Estimated: 18 mL/min — ABNORMAL LOW (ref >60)
Glucose, Bld: 157 mg/dL — ABNORMAL HIGH (ref 70–99)
Potassium: 4.7 mmol/L (ref 3.5–5.1)
Sodium: 140 mmol/L (ref 135–145)

## 2024-04-18 LAB — GLUCOSE, CAPILLARY
Glucose-Capillary: 169 mg/dL — ABNORMAL HIGH (ref 70–99)
Glucose-Capillary: 169 mg/dL — ABNORMAL HIGH (ref 70–99)
Glucose-Capillary: 223 mg/dL — ABNORMAL HIGH (ref 70–99)
Glucose-Capillary: 174 mg/dL — ABNORMAL HIGH (ref 70–99)

## 2024-04-18 LAB — CBC
HCT: 30.4 % — ABNORMAL LOW (ref 39.0–52.0)
Hemoglobin: 9.6 g/dL — ABNORMAL LOW (ref 13.0–17.0)
MCH: 29.5 pg (ref 26.0–34.0)
MCHC: 31.6 g/dL (ref 30.0–36.0)
MCV: 93.5 fL (ref 80.0–100.0)
Platelets: 169 K/uL (ref 150–400)
RBC: 3.25 MIL/uL — ABNORMAL LOW (ref 4.22–5.81)
RDW: 13.9 % (ref 11.5–15.5)
WBC: 5.4 K/uL (ref 4.0–10.5)
nRBC: 0 % (ref 0.0–0.2)

## 2024-04-18 LAB — MAGNESIUM: Magnesium: 1.7 mg/dL (ref 1.7–2.4)

## 2024-04-18 MED ORDER — SODIUM CHLORIDE 0.9 % IV SOLN: INTRAVENOUS | Status: DC

## 2024-04-18 NOTE — Plan of Care (Signed)

## 2024-04-18 NOTE — Progress Notes (Signed)
 Progress Note   Patient: Eugene [name] FMW:[id number] DOB: Feb 15, 1961 DOA: 04/14/2024  DOS: the patient was seen and examined on 04/18/2024   Brief hospital course:  63 y.o. male with medical history significant of DM2, bilateral BKA, hep C, chronic suprapubic cath, recurrent fecal impaction who was sent from SNF, found to have AKI.   Assessment and Plan:   Acute kidney injury - Creatinine 4.23 on presentation, unknown current baseline.  Last creatinine on record 0.84 in July 2022.  Showing improvement with IV fluid hydration, creatinine down to 3.59 this morning.  Will recheck BMP in AM.  Hold nephrotoxic agents.  Continue IV fluids.  Recheck BMP in AM.   Mixed acid-base disorder - On presentation bicarb 16, likely acidemia from AKI/CKD.  With possible ketosis.  Responded well to bicarb drip, acidemia appears resolved.  Will discontinue bicarb drip.  Continue NS at 100 mL/h.  Will recheck BMP in AM.   Mild hyperkalemia - Continues to show improvement, K down to 4.7 this a.m.  Likely exacerbated by dehydration and AKI/CKD.  IV fluids on board.  Will recheck BMP and magnesium  in AM.   Chronic constipation - Responded well to scheduled Dulcolax with as needed suppository, as needed enema.  Constipation resolved.   Possible urinary tract infection with chronic suprapubic catheter - Patient with noted dysuria.  UA noting leukocytes, WBCs, bacteria.  Urine culture noting Proteus as well as small colonies of staph aureus.  Suprapubic catheter replaced today.  Transition from ceftriaxone  to Keflex .  diabetes mellitus - A1c 6.0 suggesting excellent control.  Insulin  sliding scale board.   Chronic opioid use - Likely exacerbating AKI and constipation.  Continue home oxycodone  5 mg 3 times daily as needed.   Acute nausea and vomiting - Still having intermittent nausea.  Stated he threw up a sausage this morning but was able to tolerate softer foods.  Etiology may be secondary to CAUTI.   Antiemetics on board.  Treatment as above.   Subjective: Patient still feeling nauseous this morning.  States he threw up the sauce that she attempted to eat for breakfast.  Was able to tolerate grits and applesauce however.  Still feels unwell.  Denies any fever, chills, chest pain, nausea, vomiting, abdominal pain.  Physical Exam:  Vitals:   04/17/24 2008 04/18/24 0311 04/18/24 0722 04/18/24 1058  BP: 138/67 (!) 126/44 128/73   Pulse: 76 60 91   Resp: 18 18 16    Temp: 98 F (36.7 C) 98.1 F (36.7 C) 97.7 F (36.5 C)   TempSrc: Oral  Oral   SpO2: 91%  (!) 75% 98%  Weight:      Height:        GENERAL:  Alert, pleasant, malaise, obese HEENT:  EOMI CARDIOVASCULAR:  RRR, no murmurs appreciated RESPIRATORY:  Clear to auscultation, no wheezing, rales, or rhonchi GASTROINTESTINAL:  Soft, nontender, nondistended, suprapubic catheter EXTREMITIES:  No LE edema bilaterally NEURO:  No new focal deficits appreciated SKIN:  No rashes noted PSYCH:  Appropriate mood and affect    Data Reviewed:  Imaging Studies: CT CHEST ABDOMEN PELVIS WO CONTRAST Result Date: 04/14/2024 CLINICAL DATA:  Constipation. Abdominal pain. Dilated bowel loops and abnormal chest radiograph. EXAM: CT CHEST, ABDOMEN AND PELVIS WITHOUT CONTRAST TECHNIQUE: Multidetector CT imaging of the chest, abdomen and pelvis was performed following the standard protocol without IV contrast. RADIATION DOSE REDUCTION: This exam was performed according to the departmental dose-optimization program which includes automated exposure control, adjustment of the mA and/or kV according  to patient size and/or use of iterative reconstruction technique. COMPARISON:  AP CT on 04/17/2021 FINDINGS: CT CHEST FINDINGS Cardiovascular: No acute findings. Mediastinum/Lymph Nodes: No masses or pathologically enlarged lymph nodes identified on this unenhanced exam. Lungs/Pleura: Small bilateral pleural effusions of pleural thickening. Bilateral  pleural-parenchymal scarring greatest in lower lobes, with areas of rounded atelectasis in both lower lobes. Musculoskeletal: No suspicious bone lesions identified. Thoracic spine fusion hardware noted. CT ABDOMEN AND PELVIS FINDINGS Hepatobiliary: No masses visualized on this unenhanced exam. Mildly distended gallbladder, without other signs of cholecystitis or biliary ductal dilatation. Pancreas: No mass or inflammatory changes identified on this unenhanced exam. Spleen:  Within normal limits in size. Adrenals/Urinary Tract: No evidence of urolithiasis or hydronephrosis. Suprapubic bladder catheter in appropriate position. Stomach/Bowel: No evidence of obstruction, inflammatory process, or abnormal fluid collections. Moderate colonic stool burden noted. Small umbilical hernia is seen which contains only fat. Vascular/Lymphatic: No pathologically enlarged lymph nodes identified. No abdominal aortic aneurysm. Reproductive:  No masses or other significant abnormality. Other:  None. Musculoskeletal:  No suspicious bone lesions identified. IMPRESSION: No evidence of bowel obstruction or other acute findings within the abdomen or pelvis. Small bilateral pleural effusions with pleural thickening. Bilateral pleural-parenchymal scarring, with areas of rounded atelectasis in both lower lobes. Small umbilical hernia, which contains only fat. Electronically Signed   By: Norleen DELENA Kil M.D.   On: 04/14/2024 09:11    There are no new results to review at this time.  Previous records (including but not limited to H&P, progress notes, nursing notes, TOC management) were reviewed in assessment of this patient.  Labs: CBC: Recent Labs  Lab 04/14/24 0643 04/15/24 0519 04/16/24 0357 04/17/24 0442 04/18/24 0511  WBC 7.7 6.3 5.5 5.6 5.4  NEUTROABS 4.5  --   --   --   --   HGB 11.2* 11.0* 10.0* 10.0* 9.6*  HCT 35.1* 33.7* 30.7* 30.6* 30.4*  MCV 93.6 90.8 90.8 90.5 93.5  PLT 231 238 198 168 169   Basic Metabolic  Panel: Recent Labs  Lab 04/14/24 0643 04/15/24 0519 04/16/24 0357 04/17/24 0442 04/18/24 0511  NA 141 137 140 140 140  K 5.2* 5.4* 5.4* 5.0 4.7  CL 112* 114* 112* 111 111  CO2 17* 16* 20* 24 21*  GLUCOSE 86 131* 214* 213* 157*  BUN 60* 53* 52* 51* 45*  CREATININE 4.23* 3.88* 3.82* 3.69* 3.59*  CALCIUM  8.2* 7.7* 7.5* 7.2* 7.0*  MG  --  1.8 1.8 1.9 1.7  PHOS  --   --  4.8* 4.5  --    Liver Function Tests: Recent Labs  Lab 04/14/24 0643  AST 18  ALT 19  ALKPHOS 58  BILITOT 0.5  PROT 6.5  ALBUMIN  2.0*   CBG: Recent Labs  Lab 04/17/24 1508 04/17/24 1710 04/17/24 2125 04/18/24 0752 04/18/24 1142  GLUCAP 185* 161* 204* 169* 169*    Scheduled Meds:  amLODipine   10 mg Oral Daily   bisacodyl   10 mg Oral Daily   cephALEXin   500 mg Oral Q8H   Chlorhexidine  Gluconate Cloth  6 each Topical Q0600   gabapentin   300 mg Oral QHS   heparin   5,000 Units Subcutaneous Q8H   insulin  aspart  0-5 Units Subcutaneous QHS   insulin  aspart  0-9 Units Subcutaneous TID WC   levothyroxine   175 mcg Oral Q0600   linaclotide   145 mcg Oral QAC breakfast   melatonin  10 mg Oral QHS   metoprolol  succinate  25 mg Oral Daily  nicotine   21 mg Transdermal Daily   senna-docusate  1 tablet Oral BID   Continuous Infusions:  sodium chloride      PRN Meds:.bisacodyl , hydrocortisone  cream, ondansetron  (ZOFRAN ) IV, oxyCODONE , sodium phosphate , traZODone   Family Communication: None at bedside  Disposition: Status is: Inpatient Remains inpatient appropriate because: Acute kidney injury, CAUTI     Time spent: 49 minutes  Length of inpatient stay: 3 days  Author: Carliss LELON Canales, DO 04/18/2024 1:14 PM  For on call review [email or web address].

## 2024-04-19 ENCOUNTER — Inpatient Hospital Stay
Admit: 2024-04-19 | Discharge: 2024-04-19 | Disposition: A | Discharge status: Home / Self Care | Attending: Internal Medicine

## 2024-04-19 ENCOUNTER — Inpatient Hospital Stay

## 2024-04-19 DIAGNOSIS — J9621 Acute and chronic respiratory failure with hypoxia: Secondary | ICD-10-CM

## 2024-04-19 DIAGNOSIS — Z9359 Other cystostomy status: Secondary | ICD-10-CM | POA: Diagnosis not present

## 2024-04-19 DIAGNOSIS — T83511A Infection and inflammatory reaction due to indwelling urethral catheter, initial encounter: Secondary | ICD-10-CM | POA: Diagnosis not present

## 2024-04-19 DIAGNOSIS — N179 Acute kidney failure, unspecified: Secondary | ICD-10-CM | POA: Diagnosis not present

## 2024-04-19 DIAGNOSIS — J9601 Acute respiratory failure with hypoxia: Secondary | ICD-10-CM

## 2024-04-19 LAB — CBC
HCT: 31.6 % — ABNORMAL LOW (ref 39.0–52.0)
Hemoglobin: 9.9 g/dL — ABNORMAL LOW (ref 13.0–17.0)
MCH: 29.7 pg (ref 26.0–34.0)
MCHC: 31.3 g/dL (ref 30.0–36.0)
MCV: 94.9 fL (ref 80.0–100.0)
Platelets: 176 K/uL (ref 150–400)
RBC: 3.33 MIL/uL — ABNORMAL LOW (ref 4.22–5.81)
RDW: 13.6 % (ref 11.5–15.5)
WBC: 6.8 K/uL (ref 4.0–10.5)
nRBC: 0 % (ref 0.0–0.2)

## 2024-04-19 LAB — BASIC METABOLIC PANEL WITH GFR
Anion gap: 6 (ref 5–15)
BUN: 46 mg/dL — ABNORMAL HIGH (ref 8–23)
CO2: 22 mmol/L (ref 22–32)
Calcium: 7.2 mg/dL — ABNORMAL LOW (ref 8.9–10.3)
Chloride: 113 mmol/L — ABNORMAL HIGH (ref 98–111)
Creatinine, Ser: 3.64 mg/dL — ABNORMAL HIGH (ref 0.61–1.24)
GFR, Estimated: 18 mL/min — ABNORMAL LOW (ref >60)
Glucose, Bld: 189 mg/dL — ABNORMAL HIGH (ref 70–99)
Potassium: 4.9 mmol/L (ref 3.5–5.1)
Sodium: 141 mmol/L (ref 135–145)

## 2024-04-19 LAB — ECHOCARDIOGRAM COMPLETE
AR max vel: 3.8 cm2
AV Peak grad: 5.4 mmHg
Ao pk vel: 1.16 m/s
Area-P 1/2: 4.68 cm2
Height: 60 in
Weight: 4127.01 [oz_av]

## 2024-04-19 LAB — BRAIN NATRIURETIC PEPTIDE: B Natriuretic Peptide: 199.7 pg/mL — ABNORMAL HIGH (ref 0.0–100.0)

## 2024-04-19 LAB — TROPONIN I (HIGH SENSITIVITY)
Troponin I (High Sensitivity): 10 ng/L (ref <18)
Troponin I (High Sensitivity): 14 ng/L (ref <18)

## 2024-04-19 LAB — GLUCOSE, CAPILLARY
Glucose-Capillary: 211 mg/dL — ABNORMAL HIGH (ref 70–99)
Glucose-Capillary: 298 mg/dL — ABNORMAL HIGH (ref 70–99)
Glucose-Capillary: 329 mg/dL — ABNORMAL HIGH (ref 70–99)

## 2024-04-19 LAB — D-DIMER, QUANTITATIVE: D-Dimer, Quant: 2.89 ug{FEU}/mL — ABNORMAL HIGH (ref 0.00–0.50)

## 2024-04-19 LAB — BLOOD GAS, VENOUS

## 2024-04-19 LAB — MAGNESIUM: Magnesium: 1.8 mg/dL (ref 1.7–2.4)

## 2024-04-19 MED ORDER — BUDESONIDE 0.25 MG/2ML IN SUSP
0.2500 mg | Freq: Two times a day (BID) | RESPIRATORY_TRACT | Status: DC
  Administered 2024-04-19 – 2024-04-25 (×11): 0.25 mg via RESPIRATORY_TRACT
  Filled 2024-04-19 (×14): qty 2

## 2024-04-19 MED ORDER — PERFLUTREN LIPID MICROSPHERE
1.0000 mL | INTRAVENOUS | Status: AC | PRN
  Administered 2024-04-19: 4 mL via INTRAVENOUS

## 2024-04-19 MED ORDER — PREDNISONE 50 MG PO TABS
60.0000 mg | ORAL_TABLET | Freq: Every day | ORAL | Status: AC
  Administered 2024-04-20 – 2024-04-23 (×3): 60 mg via ORAL
  Filled 2024-04-19 (×4): qty 1

## 2024-04-19 MED ORDER — FUROSEMIDE 10 MG/ML IJ SOLN
40.0000 mg | Freq: Two times a day (BID) | INTRAMUSCULAR | Status: DC
  Administered 2024-04-19 (×2): 40 mg via INTRAVENOUS
  Filled 2024-04-19 (×2): qty 4

## 2024-04-19 MED ORDER — METHYLPREDNISOLONE SODIUM SUCC 125 MG IJ SOLR
60.0000 mg | Freq: Once | INTRAMUSCULAR | Status: AC
  Administered 2024-04-19: 60 mg via INTRAVENOUS
  Filled 2024-04-19: qty 2

## 2024-04-19 MED ORDER — IPRATROPIUM-ALBUTEROL 0.5-2.5 (3) MG/3ML IN SOLN
3.0000 mL | RESPIRATORY_TRACT | Status: DC | PRN
  Administered 2024-04-19 (×2): 3 mL via RESPIRATORY_TRACT
  Filled 2024-04-19 (×2): qty 3

## 2024-04-19 NOTE — Plan of Care (Signed)

## 2024-04-19 NOTE — Progress Notes (Signed)
 Progress Note   Patient: Eugene [name] FMW:[id number] DOB: 1961-03-30 DOA: 04/14/2024  DOS: the patient was seen and examined on 04/19/2024   Brief hospital course:  63 y.o. male with medical history significant of DM2, bilateral BKA, hep C, chronic suprapubic cath, recurrent fecal impaction who was sent from SNF, found to have AKI.   Assessment and Plan:   Acute acute respiratory failure - Acute episode of hypoxia overnight.  Patient stating he is having difficulty getting comfortable and breathing finally came to ahead became panicked.  Initially requiring 8 L nasal cannula, now down to 5 L.  Denies any frank vomiting or inciting event.  Differentials include PE, volume overload from ongoing IV fluids, aspiration pneumonitis.  Dimer minimally elevated, V/Q ordered.  Chest x-ray personally reviewed does show bilateral effusions and congestion.  Will order Lasix  40 mg IV twice daily and monitor urine output closely.  Will order echo.  Will wean O2 as tolerated.  Acute kidney injury - Creatinine 4.23 on presentation, unknown current baseline.  Last creatinine on record 0.84 in July 2022.  Showing improvement with IV fluid hydration, although somewhat stable for the last couple days, creatinine 3.64.  Will recheck BMP in AM.  Hold nephrotoxic agents.  Continue IV fluids.  Recheck BMP in AM.  Diuretic as above.   Mixed acid-base disorder - On presentation bicarb 16, likely acidemia from AKI/CKD.   Responded well to bicarb drip, acidemia appears resolved.     Mild hyperkalemia - Continues to show improvement.  Likely exacerbated by dehydration and AKI/CKD. Will recheck BMP and magnesium  in AM.   Chronic constipation - Responded well to scheduled Dulcolax with as needed suppository, as needed enema.  Constipation resolved.   Possible urinary tract infection with chronic suprapubic catheter - Patient with noted dysuria.  UA noting leukocytes, WBCs, bacteria.  Urine culture noting Proteus as well  as small colonies of staph aureus.  Suprapubic catheter replaced 7/26.  Transition from ceftriaxone  to Keflex .   diabetes mellitus - A1c 6.0 suggesting excellent control.  Insulin  sliding scale board.   Chronic opioid use - Likely exacerbating AKI and constipation.  Continue home oxycodone  5 mg 3 times daily as needed.   Acute nausea and vomiting - No nausea or vomiting since yesterday morning.  Etiology may be secondary to CAUTI.  Antiemetics on board.  Treatment as above.   Subjective: Reported overnight the patient had acute hypoxic event.  Patient states that he was rolling around unable to get comfortable, he became progressively more short of breath.  Eventually came to a panic.  Subsequently placed on 8 L mask with improvement in symptoms.  Denies any vomiting or aspiration.  Currently feels improved this morning on 5 L nasal cannula.  Denies cough, purulent sputum, fever, chest pain, nausea, vomiting, abdominal pain.  Physical Exam:  Vitals:   04/19/24 0500 04/19/24 0742 04/19/24 0754 04/19/24 0822  BP:   (!) 162/74 (!) 153/94  Pulse:   80 (!) 105  Resp:      Temp:   (!) 97.4 F (36.3 C) 97.9 F (36.6 C)  TempSrc:   Oral   SpO2: 94% 93% 93% 93%  Weight:      Height:        GENERAL:  Alert, pleasant, obese HEENT:  EOMI, nasal cannula CARDIOVASCULAR:  RRR, no murmurs appreciated RESPIRATORY: Poor air movement bilaterally  GASTROINTESTINAL:  Soft, nontender, nondistended, suprapubic catheter EXTREMITIES: BL BKA NEURO:  No new focal deficits appreciated SKIN:  No rashes  noted PSYCH:  Appropriate mood and affect    Data Reviewed:  Imaging Studies: DG Chest Port 1 View Result Date: 04/19/2024 CLINICAL DATA:  [id number].  Hypoxia. EXAM: PORTABLE CHEST 1 VIEW COMPARISON:  CT 04/14/2024 FINDINGS: Small pleural effusions are again noted with overlying opacities on the left-greater-than-right. On CT this appeared largely due to rounded atelectasis. The radiographic appearance is  nonspecific. The opacities merge with coarse linear atelectatic markings converging on the hila. The upper lung fields are clear. There is mild cardiomegaly without CHF. The mediastinum is normally outlined. There is thoracic spine fusion hardware and a metallic cylinder graft in the lower thoracic spine. No new abnormality. IMPRESSION: 1. Small pleural effusions with overlying opacities on the left-greater-than-right. On CT this appeared largely due to rounded atelectasis. The radiographic appearance is nonspecific, but the overall aeration seems unchanged. 2. Mild cardiomegaly without CHF. Electronically Signed   By: Francis Quam M.D.   On: 04/19/2024 06:52   CT CHEST ABDOMEN PELVIS WO CONTRAST Result Date: 04/14/2024 CLINICAL DATA:  Constipation. Abdominal pain. Dilated bowel loops and abnormal chest radiograph. EXAM: CT CHEST, ABDOMEN AND PELVIS WITHOUT CONTRAST TECHNIQUE: Multidetector CT imaging of the chest, abdomen and pelvis was performed following the standard protocol without IV contrast. RADIATION DOSE REDUCTION: This exam was performed according to the departmental dose-optimization program which includes automated exposure control, adjustment of the mA and/or kV according to patient size and/or use of iterative reconstruction technique. COMPARISON:  AP CT on 04/17/2021 FINDINGS: CT CHEST FINDINGS Cardiovascular: No acute findings. Mediastinum/Lymph Nodes: No masses or pathologically enlarged lymph nodes identified on this unenhanced exam. Lungs/Pleura: Small bilateral pleural effusions of pleural thickening. Bilateral pleural-parenchymal scarring greatest in lower lobes, with areas of rounded atelectasis in both lower lobes. Musculoskeletal: No suspicious bone lesions identified. Thoracic spine fusion hardware noted. CT ABDOMEN AND PELVIS FINDINGS Hepatobiliary: No masses visualized on this unenhanced exam. Mildly distended gallbladder, without other signs of cholecystitis or biliary ductal  dilatation. Pancreas: No mass or inflammatory changes identified on this unenhanced exam. Spleen:  Within normal limits in size. Adrenals/Urinary Tract: No evidence of urolithiasis or hydronephrosis. Suprapubic bladder catheter in appropriate position. Stomach/Bowel: No evidence of obstruction, inflammatory process, or abnormal fluid collections. Moderate colonic stool burden noted. Small umbilical hernia is seen which contains only fat. Vascular/Lymphatic: No pathologically enlarged lymph nodes identified. No abdominal aortic aneurysm. Reproductive:  No masses or other significant abnormality. Other:  None. Musculoskeletal:  No suspicious bone lesions identified. IMPRESSION: No evidence of bowel obstruction or other acute findings within the abdomen or pelvis. Small bilateral pleural effusions with pleural thickening. Bilateral pleural-parenchymal scarring, with areas of rounded atelectasis in both lower lobes. Small umbilical hernia, which contains only fat. Electronically Signed   By: Norleen DELENA Kil M.D.   On: 04/14/2024 09:11    Results are pending, will review when available.  Previous records (including but not limited to H&P, progress notes, nursing notes, TOC management) were reviewed in assessment of this patient.  Labs: CBC: Recent Labs  Lab 04/14/24 0643 04/15/24 0519 04/16/24 0357 04/17/24 0442 04/18/24 0511 04/19/24 0606  WBC 7.7 6.3 5.5 5.6 5.4 6.8  NEUTROABS 4.5  --   --   --   --   --   HGB 11.2* 11.0* 10.0* 10.0* 9.6* 9.9*  HCT 35.1* 33.7* 30.7* 30.6* 30.4* 31.6*  MCV 93.6 90.8 90.8 90.5 93.5 94.9  PLT 231 238 198 168 169 176   Basic Metabolic Panel: Recent Labs  Lab 04/15/24 0519 04/16/24  9642 04/17/24 0442 04/18/24 0511 04/19/24 0606  NA 137 140 140 140 141  K 5.4* 5.4* 5.0 4.7 4.9  CL 114* 112* 111 111 113*  CO2 16* 20* 24 21* 22  GLUCOSE 131* 214* 213* 157* 189*  BUN 53* 52* 51* 45* 46*  CREATININE 3.88* 3.82* 3.69* 3.59* 3.64*  CALCIUM  7.7* 7.5* 7.2* 7.0*  7.2*  MG 1.8 1.8 1.9 1.7 1.8  PHOS  --  4.8* 4.5  --   --    Liver Function Tests: Recent Labs  Lab 04/14/24 0643  AST 18  ALT 19  ALKPHOS 58  BILITOT 0.5  PROT 6.5  ALBUMIN  2.0*   CBG: Recent Labs  Lab 04/18/24 0752 04/18/24 1142 04/18/24 1703 04/18/24 2118 04/19/24 0822  GLUCAP 169* 169* 223* 174* 211*    Scheduled Meds:  amLODipine   10 mg Oral Daily   bisacodyl   10 mg Oral Daily   budesonide  (PULMICORT ) nebulizer solution  0.25 mg Nebulization BID   cephALEXin   500 mg Oral Q8H   Chlorhexidine  Gluconate Cloth  6 each Topical Q0600   furosemide   40 mg Intravenous Q12H   gabapentin   300 mg Oral QHS   heparin   5,000 Units Subcutaneous Q8H   insulin  aspart  0-5 Units Subcutaneous QHS   insulin  aspart  0-9 Units Subcutaneous TID WC   levothyroxine   175 mcg Oral Q0600   linaclotide   145 mcg Oral QAC breakfast   melatonin  10 mg Oral QHS   metoprolol  succinate  25 mg Oral Daily   nicotine   21 mg Transdermal Daily   [START ON 04/20/2024] predniSONE   60 mg Oral Q breakfast   senna-docusate  1 tablet Oral BID   Continuous Infusions: PRN Meds:.bisacodyl , hydrocortisone  cream, ipratropium-albuterol , ondansetron  (ZOFRAN ) IV, oxyCODONE , sodium phosphate , traZODone   Family Communication: None at bedside  Disposition: Status is: Inpatient Remains inpatient appropriate because: Respiratory failure     Time spent: 52 minutes  Length of inpatient stay: 4 days  Author: Carliss LELON Canales, DO 04/19/2024 10:43 AM  For on call review [email or web address].

## 2024-04-19 NOTE — Progress Notes (Signed)
 Patient placed on overnight pulse oximetry, 7L cool HF, saturation 93%, patient continues to refuse to wear BIPAP

## 2024-04-19 NOTE — Plan of Care (Signed)
  Problem: Education: Goal: Ability to describe self-care measures that may prevent or decrease complications (Diabetes Survival Skills Education) will improve Outcome: Progressing Goal: Individualized Educational Video(s) Outcome: Progressing   Problem: Coping: Goal: Ability to adjust to condition or change in health will improve Outcome: Progressing   Problem: Fluid Volume: Goal: Ability to maintain a balanced intake and output will improve Outcome: Progressing   Problem: Health Behavior/Discharge Planning: Goal: Ability to identify and utilize available resources and services will improve Outcome: Progressing Goal: Ability to manage health-related needs will improve Outcome: Progressing   Problem: Metabolic: Goal: Ability to maintain appropriate glucose levels will improve Outcome: Progressing   Problem: Nutritional: Goal: Maintenance of adequate nutrition will improve Outcome: Progressing Goal: Progress toward achieving an optimal weight will improve Outcome: Progressing   Problem: Skin Integrity: Goal: Risk for impaired skin integrity will decrease Outcome: Progressing   Problem: Tissue Perfusion: Goal: Adequacy of tissue perfusion will improve Outcome: Progressing   Problem: Education: Goal: Knowledge of General Education information will improve Description: Including pain rating scale, medication(s)/side effects and non-pharmacologic comfort measures Outcome: Progressing   Problem: Health Behavior/Discharge Planning: Goal: Ability to manage health-related needs will improve Outcome: Progressing   Problem: Clinical Measurements: Goal: Ability to maintain clinical measurements within normal limits will improve Outcome: Progressing Goal: Will remain free from infection Outcome: Progressing Goal: Diagnostic test results will improve Outcome: Progressing Goal: Cardiovascular complication will be avoided Outcome: Progressing   Problem: Activity: Goal: Risk  for activity intolerance will decrease Outcome: Progressing   Problem: Nutrition: Goal: Adequate nutrition will be maintained Outcome: Progressing   Problem: Coping: Goal: Level of anxiety will decrease Outcome: Progressing   Problem: Elimination: Goal: Will not experience complications related to bowel motility Outcome: Progressing Goal: Will not experience complications related to urinary retention Outcome: Progressing   Problem: Pain Managment: Goal: General experience of comfort will improve and/or be controlled Outcome: Progressing   Problem: Safety: Goal: Ability to remain free from injury will improve Outcome: Progressing   Problem: Skin Integrity: Goal: Risk for impaired skin integrity will decrease Outcome: Progressing

## 2024-04-19 NOTE — Progress Notes (Signed)
 Echocardiogram 2D Echocardiogram has been performed.  Eugene [name] 04/19/2024, 12:10 PM

## 2024-04-19 NOTE — Progress Notes (Signed)
 Pt was transferred to 294 with all of his belongings

## 2024-04-19 NOTE — Significant Event (Signed)
       CROSS COVER NOTE  NAME: Courtland Reas MRN: [id number] DOB : 12-02-1960 ATTENDING PHYSICIAN: Arlon Carliss ORN, DO    Date of Service   04/19/2024   HPI/Events of Note   Notified via secure chat patient with sats in 60s and resp distrss - also reports sats had been getting slightly low last couple nights but was refusing supplemental oxygen..  Mr Rustyn Conery from SNF for abd pain. Found to have AKI and acidosis that responded to bicarb. He has history of DM, PVD, bilateral BKA, neurogenic bladder s/p suprapubic catheter placement, tobacco abuse (remains active smoker),  depression and anxiety, bradycardia and spinal stenosis Denies formal testing for COPD or sleep apnea CT chest abd pelvis on admission IMPRESSION: No evidence of bowel obstruction or other acute findings within the abdomen or pelvis.   Small bilateral pleural effusions with pleural thickening.   Bilateral pleural-parenchymal scarring, with areas of rounded atelectasis in both lower lobes.   Small umbilical hernia, which contains only fat.    Interventions   Assessment/Plan:    04/19/2024    2:11 AM 04/18/2024    8:03 PM 04/18/2024    5:05 PM  Vitals with BMI  Systolic 129 116 864  Diastolic 55 59 56  Pulse  49 92   Temp                     98.2 Patient endorses 18 lb weight gain and increased shortness of breath with activity over the last 8 months. Alert and oriented  Follows commands Scattered wheezing throughout both lungs Non pitting edema generalized EKG SR no STEMI  Chest xray - rad read pending VBG  Latest Reference Range & Units 04/19/24 06:06  pH, Ven 7.25 - 7.43  7.44 (H)  pCO2, Ven 44 - 60 mmHg 32 (L)  pO2, Ven 32 - 45 mmHg 76 (H)  Acid-base deficit 0.0 - 2.0 mmol/L 1.6  Bicarbonate 20.0 - 28.0 mmol/L 21.7  O2 Saturation % PENDING  Patient temperature  37.0  Collection site  VEIN  (H): Data is abnormally high (L): Data is abnormally low  Ddimer BNP Troponin  DDX PNA, OSA,  CHF, ACS, PE; COPD with exacerbation   CPAP/oxygen keep sats above 90 Prednisone  60 mg daily for 5 days; pulmicort  bid; duonebs prn  Day team to consult pulmonology Overnight pulse ox sleep study Cardiac monitoring - continuous pulse ox Will need f/u with VQ if Ddimer + Transfer higher level of care 2A - cardiac tele Consider echo if bnp elevated       Erminio LITTIE Cone NP [facility] Cross Cover 7pm-7am - check amion for availability Pager [phone]

## 2024-04-20 ENCOUNTER — Inpatient Hospital Stay

## 2024-04-20 DIAGNOSIS — N390 Urinary tract infection, site not specified: Secondary | ICD-10-CM

## 2024-04-20 DIAGNOSIS — J9601 Acute respiratory failure with hypoxia: Secondary | ICD-10-CM | POA: Diagnosis not present

## 2024-04-20 DIAGNOSIS — T83511A Infection and inflammatory reaction due to indwelling urethral catheter, initial encounter: Secondary | ICD-10-CM | POA: Diagnosis not present

## 2024-04-20 DIAGNOSIS — N179 Acute kidney failure, unspecified: Secondary | ICD-10-CM | POA: Diagnosis not present

## 2024-04-20 DIAGNOSIS — E874 Mixed disorder of acid-base balance: Secondary | ICD-10-CM | POA: Diagnosis not present

## 2024-04-20 LAB — GLUCOSE, CAPILLARY
Glucose-Capillary: 225 mg/dL — ABNORMAL HIGH (ref 70–99)
Glucose-Capillary: 269 mg/dL — ABNORMAL HIGH (ref 70–99)
Glucose-Capillary: 304 mg/dL — ABNORMAL HIGH (ref 70–99)
Glucose-Capillary: 331 mg/dL — ABNORMAL HIGH (ref 70–99)

## 2024-04-20 LAB — CBC
HCT: 31 % — ABNORMAL LOW (ref 39.0–52.0)
Hemoglobin: 9.8 g/dL — ABNORMAL LOW (ref 13.0–17.0)
MCH: 29.4 pg (ref 26.0–34.0)
MCHC: 31.6 g/dL (ref 30.0–36.0)
MCV: 93.1 fL (ref 80.0–100.0)
Platelets: 194 K/uL (ref 150–400)
RBC: 3.33 MIL/uL — ABNORMAL LOW (ref 4.22–5.81)
RDW: 13.4 % (ref 11.5–15.5)
WBC: 8.1 K/uL (ref 4.0–10.5)
nRBC: 0 % (ref 0.0–0.2)

## 2024-04-20 LAB — BASIC METABOLIC PANEL WITH GFR
Anion gap: 11 (ref 5–15)
Anion gap: 11 (ref 5–15)
Anion gap: 9 (ref 5–15)
BUN: 65 mg/dL — ABNORMAL HIGH (ref 8–23)
BUN: 64 mg/dL — ABNORMAL HIGH (ref 8–23)
BUN: 66 mg/dL — ABNORMAL HIGH (ref 8–23)
CO2: 16 mmol/L — ABNORMAL LOW (ref 22–32)
CO2: 20 mmol/L — ABNORMAL LOW (ref 22–32)
CO2: 17 mmol/L — ABNORMAL LOW (ref 22–32)
Calcium: 7.5 mg/dL — ABNORMAL LOW (ref 8.9–10.3)
Calcium: 7.5 mg/dL — ABNORMAL LOW (ref 8.9–10.3)
Calcium: 7.5 mg/dL — ABNORMAL LOW (ref 8.9–10.3)
Chloride: 111 mmol/L (ref 98–111)
Chloride: 112 mmol/L — ABNORMAL HIGH (ref 98–111)
Chloride: 111 mmol/L (ref 98–111)
Creatinine, Ser: 4.05 mg/dL — ABNORMAL HIGH (ref 0.61–1.24)
Creatinine, Ser: 4.12 mg/dL — ABNORMAL HIGH (ref 0.61–1.24)
Creatinine, Ser: 3.93 mg/dL — ABNORMAL HIGH (ref 0.61–1.24)
GFR, Estimated: 16 mL/min — ABNORMAL LOW (ref >60)
GFR, Estimated: 16 mL/min — ABNORMAL LOW (ref >60)
GFR, Estimated: 17 mL/min — ABNORMAL LOW (ref >60)
Glucose, Bld: 262 mg/dL — ABNORMAL HIGH (ref 70–99)
Glucose, Bld: 229 mg/dL — ABNORMAL HIGH (ref 70–99)
Glucose, Bld: 310 mg/dL — ABNORMAL HIGH (ref 70–99)
Potassium: 6.2 mmol/L — ABNORMAL HIGH (ref 3.5–5.1)
Potassium: 5.8 mmol/L — ABNORMAL HIGH (ref 3.5–5.1)
Potassium: 6.4 mmol/L (ref 3.5–5.1)
Sodium: 138 mmol/L (ref 135–145)
Sodium: 143 mmol/L (ref 135–145)
Sodium: 137 mmol/L (ref 135–145)

## 2024-04-20 LAB — PROTIME-INR
INR: 1.1 (ref 0.8–1.2)
Prothrombin Time: 14.4 s (ref 11.4–15.2)

## 2024-04-20 LAB — MAGNESIUM: Magnesium: 2 mg/dL (ref 1.7–2.4)

## 2024-04-20 LAB — APTT: aPTT: 31 s (ref 24–36)

## 2024-04-20 LAB — HEPARIN LEVEL (UNFRACTIONATED): Heparin Unfractionated: 0.34 [IU]/mL (ref 0.30–0.70)

## 2024-04-20 MED ORDER — TECHNETIUM TO 99M ALBUMIN AGGREGATED
4.0600 | Freq: Once | INTRAVENOUS | Status: AC | PRN
  Administered 2024-04-20: 4.06 via INTRAVENOUS

## 2024-04-20 MED ORDER — INSULIN ASPART 100 UNIT/ML IV SOLN
10.0000 [IU] | Freq: Once | INTRAVENOUS | Status: AC
  Administered 2024-04-20: 10 [IU] via INTRAVENOUS
  Filled 2024-04-20: qty 0.1

## 2024-04-20 MED ORDER — INSULIN GLARGINE-YFGN 100 UNIT/ML ~~LOC~~ SOLN
10.0000 [IU] | SUBCUTANEOUS | Status: DC
  Administered 2024-04-20: 10 [IU] via SUBCUTANEOUS
  Filled 2024-04-20 (×2): qty 0.1

## 2024-04-20 MED ORDER — SODIUM CHLORIDE 0.9 % IV SOLN: INTRAVENOUS | Status: AC

## 2024-04-20 MED ORDER — HEPARIN (PORCINE) 25000 UT/250ML-% IV SOLN: 10.0000 [IU]/kg/h | INTRAVENOUS | Status: DC

## 2024-04-20 MED ORDER — FUROSEMIDE 10 MG/ML IJ SOLN
40.0000 mg | Freq: Every day | INTRAMUSCULAR | Status: DC
  Administered 2024-04-20: 40 mg via INTRAVENOUS
  Filled 2024-04-20: qty 4

## 2024-04-20 MED ORDER — SODIUM CHLORIDE 0.9 % IV BOLUS
500.0000 mL | Freq: Once | INTRAVENOUS | Status: AC
  Administered 2024-04-20: 500 mL via INTRAVENOUS

## 2024-04-20 MED ORDER — FUROSEMIDE 10 MG/ML IJ SOLN: 40.0000 mg | Freq: Once | INTRAMUSCULAR | Status: DC

## 2024-04-20 MED ORDER — HEPARIN BOLUS VIA INFUSION
6000.0000 [IU] | Freq: Once | INTRAVENOUS | Status: AC
  Administered 2024-04-20: 6000 [IU] via INTRAVENOUS
  Filled 2024-04-20: qty 6000

## 2024-04-20 MED ORDER — ALBUTEROL SULFATE (2.5 MG/3ML) 0.083% IN NEBU
2.5000 mg | INHALATION_SOLUTION | Freq: Once | RESPIRATORY_TRACT | Status: AC
  Administered 2024-04-20: 2.5 mg via RESPIRATORY_TRACT
  Filled 2024-04-20: qty 3

## 2024-04-20 MED ORDER — DEXTROSE 50 % IV SOLN
1.0000 | Freq: Once | INTRAVENOUS | Status: AC
  Administered 2024-04-20: 50 mL via INTRAVENOUS
  Filled 2024-04-20: qty 50

## 2024-04-20 MED ORDER — SODIUM ZIRCONIUM CYCLOSILICATE 10 G PO PACK
10.0000 g | PACK | Freq: Once | ORAL | Status: AC
  Administered 2024-04-20: 10 g via ORAL
  Filled 2024-04-20: qty 1

## 2024-04-20 MED ORDER — INSULIN ASPART 100 UNIT/ML IJ SOLN
3.0000 [IU] | Freq: Three times a day (TID) | INTRAMUSCULAR | Status: DC
  Administered 2024-04-20 – 2024-04-22 (×7): 3 [IU] via SUBCUTANEOUS
  Filled 2024-04-20 (×6): qty 1

## 2024-04-20 MED ORDER — HEPARIN (PORCINE) 25000 UT/250ML-% IV SOLN
1400.0000 [IU]/h | INTRAVENOUS | Status: DC
  Administered 2024-04-20 – 2024-04-21 (×2): 1250 [IU]/h via INTRAVENOUS
  Filled 2024-04-20 (×3): qty 250

## 2024-04-20 NOTE — Progress Notes (Signed)
 Critical potassium of 6.4 called to Dr. Arlon just now.

## 2024-04-20 NOTE — Progress Notes (Signed)
 Progress Note   Patient: Eugene [name] FMW:[id number] DOB: 1960/11/03 DOA: 04/14/2024  DOS: the patient was seen and examined on 04/20/2024   Brief hospital course:  63 y.o. male with medical history significant of DM2, bilateral BKA, hep C, chronic suprapubic cath, recurrent fecal impaction who was sent from SNF, found to have AKI.   Assessment and Plan:   Acute acute respiratory failure - Acute episode of hypoxia 7/26.  Initially requiring 8 L nasal cannula, now down to 5-6 L.  Denies any frank vomiting or inciting event.  Differentials include PE, volume overload from ongoing IV fluids, aspiration pneumonitis.  Dimer minimally elevated, V/Q ordered/pending still.  Initiating empiric heparin .  Chest x-ray personally reviewed does show bilateral effusions and congestion.  ECHO normal.  Responded well to IV lasix , will DC for now.  Will wean O2 as tolerated.   Acute kidney injury - Creatinine 4.23 on presentation, unknown current baseline.  Last creatinine on record 0.84 in July 2022.  Showed initial improvement with IV fluid hydration, however now worse overnight.  Likely exacerbated by diuresis.  Will order renal ultrasound.  Will recheck BMP in AM.  Hold nephrotoxic agents.  Continue IV fluids.     Mixed acid-base disorder - This morning bicarb 16, likely acidemia from AKI/CKD.  Acidemia initially resolved after bicarb drip.  Now worse again this morning, improved on recheck after IV fluids.  Mild hyperkalemia - Frankly worse this morning.  Potassium 6.0.  Initiating D50, IV insulin  10 units, fluid bolus.  Patient had received IV Lasix .  Will order Lokelma  x 1 as well.  Recheck potassium mildly improved prior to Lokelma .  Likely exacerbated by dehydration and AKI/CKD. Will recheck BMP and magnesium  later this afternoon and in AM.   Chronic constipation - Responded well to scheduled Dulcolax with as needed suppository, as needed enema.  Constipation resolved.   Possible urinary tract  infection with chronic suprapubic catheter - Patient with noted dysuria.  UA noting leukocytes, WBCs, bacteria.  Urine culture noting Proteus as well as small colonies of staph aureus.  Suprapubic catheter replaced 7/26.  Transition from ceftriaxone  to Keflex .   diabetes mellitus - A1c 6.0 suggesting excellent control prior to admission.  Not well-controlled with inpatient, likely exacerbated by prednisone  use.  Will order insulin  glargine 10 units.  Insulin  sliding scale board.  Increase meal coverage to 3 units.   Chronic opioid use - Likely exacerbating AKI and constipation.  Continue home oxycodone  5 mg 3 times daily as needed.   Acute nausea and vomiting - No nausea or vomiting since yesterday morning.  Etiology may be secondary to CAUTI.  Antiemetics on board.  Treatment as above.     Subjective: Patient resting comfortably this morning.  Still on 5-6 L nasal cannula.  Will worsening shortness of breath but still feels unwell.  VQ scan later this morning.  Denies any purulent sputum, fevers, nausea, vomiting, abdominal pain.  Physical Exam:  Vitals:   04/20/24 0520 04/20/24 0740 04/20/24 0805 04/20/24 1231  BP:   (!) 108/57 120/60  Pulse:   75 72  Resp:   18 18  Temp:   (!) 97.5 F (36.4 C) 97.9 F (36.6 C)  TempSrc:      SpO2: 96% 91% 90% 90%  Weight:      Height:        GENERAL:  Alert, pleasant, obese HEENT:  EOMI, nasal cannula CARDIOVASCULAR:  RRR, no murmurs appreciated RESPIRATORY: Poor air movement bilaterally  GASTROINTESTINAL:  Soft,  nontender, nondistended, suprapubic catheter EXTREMITIES: BL BKA NEURO:  No new focal deficits appreciated SKIN:  No rashes noted PSYCH:  Appropriate mood and affect    Data Reviewed:  Imaging Studies: ECHOCARDIOGRAM COMPLETE Result Date: 04/19/2024    ECHOCARDIOGRAM REPORT   [name] Date of Exam: 04/19/2024 Medical Rec #:  [id number]      Height:       60.0 in Accession #:    [id number]     Weight:        257.9 lb Date of Birth:  10-29-1960      BSA:          2.080 m Patient Age:    63 years       BP:           153/94 mmHg Patient Gender: M              HR:           84 bpm. Exam Location:  [facility] Procedure: 2D Echo, Cardiac Doppler, Color Doppler and Intracardiac            Opacification Agent (Both Spectral and Color Flow Doppler were            utilized during procedure). Indications:     CHF I50.9  History:         Patient has prior history of Echocardiogram examinations, most                  recent 12/10/2019. Risk Factors:Hypertension, Diabetes,                  Dyslipidemia and Current Smoker.  Sonographer:     Thea Norlander RCS Referring Phys:  [id number] Eugene [name] Diagnosing Phys: Dwayne D Callwood MD IMPRESSIONS  1. Left ventricular ejection fraction, by estimation, is 55 to 60%. The left ventricle has normal function. The left ventricle has no regional wall motion abnormalities. Left ventricular diastolic function could not be evaluated.  2. Right ventricular systolic function is normal. The right ventricular size is not well visualized.  3. The mitral valve is normal in structure. No evidence of mitral valve regurgitation.  4. The aortic valve is normal in structure. Aortic valve regurgitation is not visualized. Conclusion(s)/Recommendation(s): Poor windows for evaluation of left ventricular function by transthoracic echocardiography. Would recommend an alternative means of evaluation. FINDINGS  Left Ventricle: Left ventricular ejection fraction, by estimation, is 55 to 60%. The left ventricle has normal function. The left ventricle has no regional wall motion abnormalities. Definity  contrast agent was given IV to delineate the left ventricular  endocardial borders. Strain was performed and the global longitudinal strain is indeterminate. The left ventricular internal cavity size was normal in size. There is no left ventricular hypertrophy. Left ventricular diastolic function could not be evaluated.  Right Ventricle: The right ventricular size is not well visualized. Right vetricular wall thickness was not assessed. Right ventricular systolic function is normal. Left Atrium: Left atrial size was not assessed. Right Atrium: Right atrial size was not assessed. Pericardium: There is no evidence of pericardial effusion. Mitral Valve: The mitral valve is normal in structure. No evidence of mitral valve regurgitation. Tricuspid Valve: The tricuspid valve is normal in structure. Tricuspid valve regurgitation is trivial. Aortic Valve: The aortic valve is normal in structure. Aortic valve regurgitation is not visualized. Aortic valve peak gradient measures 5.4 mmHg. Pulmonic Valve: The pulmonic valve was not well visualized. Pulmonic valve regurgitation is not visualized.  Aorta: Aortic root could not be assessed. IAS/Shunts: The interatrial septum was not assessed. Additional Comments: 3D was performed not requiring image post processing on an independent workstation and was indeterminate.  LEFT VENTRICLE PLAX 2D LVOT diam:     2.40 cm   Diastology LV SV:         89        LV e' medial:    8.16 cm/s LV SV Index:   43        LV E/e' medial:  10.6 LVOT Area:     4.52 cm  LV e' lateral:   9.90 cm/s                          LV E/e' lateral: 8.7  LEFT ATRIUM             Index LA Vol (A2C):   41.9 ml 20.15 ml/m LA Vol (A4C):   56.3 ml 27.07 ml/m LA Biplane Vol: 48.7 ml 23.42 ml/m  AORTIC VALVE AV Area (Vmax): 3.80 cm AV Vmax:        116.00 cm/s AV Peak Grad:   5.4 mmHg LVOT Vmax:      97.50 cm/s LVOT Vmean:     62.900 cm/s LVOT VTI:       0.196 m  AORTA Ao Root diam: 4.10 cm Ao Asc diam:  3.70 cm MITRAL VALVE MV Area (PHT): 4.68 cm    SHUNTS MV Decel Time: 162 msec    Systemic VTI:  0.20 m MV E velocity: 86.50 cm/s  Systemic Diam: 2.40 cm MV A velocity: 80.70 cm/s MV E/A ratio:  1.07 Dwayne JONETTA Lovelace MD Electronically signed by Cara JONETTA Lovelace MD Signature Date/Time: 04/19/2024/1:37:58 PM    Final    DG Chest Port 1  View Result Date: 04/19/2024 CLINICAL DATA:  [id number].  Hypoxia. EXAM: PORTABLE CHEST 1 VIEW COMPARISON:  CT 04/14/2024 FINDINGS: Small pleural effusions are again noted with overlying opacities on the left-greater-than-right. On CT this appeared largely due to rounded atelectasis. The radiographic appearance is nonspecific. The opacities merge with coarse linear atelectatic markings converging on the hila. The upper lung fields are clear. There is mild cardiomegaly without CHF. The mediastinum is normally outlined. There is thoracic spine fusion hardware and a metallic cylinder graft in the lower thoracic spine. No new abnormality. IMPRESSION: 1. Small pleural effusions with overlying opacities on the left-greater-than-right. On CT this appeared largely due to rounded atelectasis. The radiographic appearance is nonspecific, but the overall aeration seems unchanged. 2. Mild cardiomegaly without CHF. Electronically Signed   By: Francis Quam M.D.   On: 04/19/2024 06:52   CT CHEST ABDOMEN PELVIS WO CONTRAST Result Date: 04/14/2024 CLINICAL DATA:  Constipation. Abdominal pain. Dilated bowel loops and abnormal chest radiograph. EXAM: CT CHEST, ABDOMEN AND PELVIS WITHOUT CONTRAST TECHNIQUE: Multidetector CT imaging of the chest, abdomen and pelvis was performed following the standard protocol without IV contrast. RADIATION DOSE REDUCTION: This exam was performed according to the departmental dose-optimization program which includes automated exposure control, adjustment of the mA and/or kV according to patient size and/or use of iterative reconstruction technique. COMPARISON:  AP CT on 04/17/2021 FINDINGS: CT CHEST FINDINGS Cardiovascular: No acute findings. Mediastinum/Lymph Nodes: No masses or pathologically enlarged lymph nodes identified on this unenhanced exam. Lungs/Pleura: Small bilateral pleural effusions of pleural thickening. Bilateral pleural-parenchymal scarring greatest in lower lobes, with areas of  rounded atelectasis in both lower lobes. Musculoskeletal: No suspicious bone  lesions identified. Thoracic spine fusion hardware noted. CT ABDOMEN AND PELVIS FINDINGS Hepatobiliary: No masses visualized on this unenhanced exam. Mildly distended gallbladder, without other signs of cholecystitis or biliary ductal dilatation. Pancreas: No mass or inflammatory changes identified on this unenhanced exam. Spleen:  Within normal limits in size. Adrenals/Urinary Tract: No evidence of urolithiasis or hydronephrosis. Suprapubic bladder catheter in appropriate position. Stomach/Bowel: No evidence of obstruction, inflammatory process, or abnormal fluid collections. Moderate colonic stool burden noted. Small umbilical hernia is seen which contains only fat. Vascular/Lymphatic: No pathologically enlarged lymph nodes identified. No abdominal aortic aneurysm. Reproductive:  No masses or other significant abnormality. Other:  None. Musculoskeletal:  No suspicious bone lesions identified. IMPRESSION: No evidence of bowel obstruction or other acute findings within the abdomen or pelvis. Small bilateral pleural effusions with pleural thickening. Bilateral pleural-parenchymal scarring, with areas of rounded atelectasis in both lower lobes. Small umbilical hernia, which contains only fat. Electronically Signed   By: Norleen DELENA Kil M.D.   On: 04/14/2024 09:11    Results are pending, will review when available.  Previous records (including but not limited to H&P, progress notes, nursing notes, TOC management) were reviewed in assessment of this patient.  Labs: CBC: Recent Labs  Lab 04/14/24 0643 04/15/24 0519 04/16/24 0357 04/17/24 0442 04/18/24 0511 04/19/24 0606 04/20/24 0912  WBC 7.7   < > 5.5 5.6 5.4 6.8 8.1  NEUTROABS 4.5  --   --   --   --   --   --   HGB 11.2*   < > 10.0* 10.0* 9.6* 9.9* 9.8*  HCT 35.1*   < > 30.7* 30.6* 30.4* 31.6* 31.0*  MCV 93.6   < > 90.8 90.5 93.5 94.9 93.1  PLT 231   < > 198 168 169 176 194    < > = values in this interval not displayed.   Basic Metabolic Panel: Recent Labs  Lab 04/16/24 0357 04/17/24 0442 04/18/24 0511 04/19/24 0606 04/20/24 0456 04/20/24 0912  NA 140 140 140 141 138 143  K 5.4* 5.0 4.7 4.9 6.2* 5.8*  CL 112* 111 111 113* 111 112*  CO2 20* 24 21* 22 16* 20*  GLUCOSE 214* 213* 157* 189* 262* 229*  BUN 52* 51* 45* 46* 65* 64*  CREATININE 3.82* 3.69* 3.59* 3.64* 4.05* 4.12*  CALCIUM  7.5* 7.2* 7.0* 7.2* 7.5* 7.5*  MG 1.8 1.9 1.7 1.8 2.0  --   PHOS 4.8* 4.5  --   --   --   --    Liver Function Tests: Recent Labs  Lab 04/14/24 0643  AST 18  ALT 19  ALKPHOS 58  BILITOT 0.5  PROT 6.5  ALBUMIN  2.0*   CBG: Recent Labs  Lab 04/19/24 0822 04/19/24 1238 04/19/24 1611 04/20/24 0806 04/20/24 1232  GLUCAP 211* 298* 329* 225* 269*    Scheduled Meds:  amLODipine   10 mg Oral Daily   bisacodyl   10 mg Oral Daily   budesonide  (PULMICORT ) nebulizer solution  0.25 mg Nebulization BID   cephALEXin   500 mg Oral Q8H   Chlorhexidine  Gluconate Cloth  6 each Topical Q0600   gabapentin   300 mg Oral QHS   insulin  aspart  0-5 Units Subcutaneous QHS   insulin  aspart  0-9 Units Subcutaneous TID WC   insulin  aspart  3 Units Subcutaneous TID WC   levothyroxine   175 mcg Oral Q0600   linaclotide   145 mcg Oral QAC breakfast   melatonin  10 mg Oral QHS   metoprolol  succinate  25  mg Oral Daily   nicotine   21 mg Transdermal Daily   predniSONE   60 mg Oral Q breakfast   senna-docusate  1 tablet Oral BID   sodium zirconium cyclosilicate   10 g Oral Once   Continuous Infusions:  sodium chloride  75 mL/hr at 04/20/24 1021   heparin  1,250 Units/hr (04/20/24 1019)   PRN Meds:.bisacodyl , hydrocortisone  cream, ipratropium-albuterol , ondansetron  (ZOFRAN ) IV, oxyCODONE , sodium phosphate , traZODone   Family Communication: None at bedside  Disposition: Status is: Inpatient Remains inpatient appropriate because: AKI, acute hypoxic respiratory failure     Time spent:  52 minutes  Length of inpatient stay: 5 days  Author: Carliss LELON Canales, DO 04/20/2024 12:52 PM  For on call review [email or web address].

## 2024-04-20 NOTE — Inpatient Diabetes Management (Signed)
 Inpatient Diabetes Program Recommendations  AACE/ADA: New Consensus Statement on Inpatient Glycemic Control (2015)  Target Ranges:  Prepandial:   less than 140 mg/dL      Peak postprandial:   less than 180 mg/dL (1-2 hours)      Critically ill patients:  140 - 180 mg/dL   Lab Results  Component Value Date   GLUCAP 269 (H) 04/20/2024   HGBA1C 6.0 (H) 04/14/2024    Latest Reference Range & Units 04/19/24 08:22 04/19/24 12:38 04/19/24 16:11 04/20/24 08:06 04/20/24 12:32  Glucose-Capillary 70 - 99 mg/dL 788 (H) 701 (H) 670 (H) 225 (H) 269 (H)  (H): Data is abnormally high  Diabetes history: DM2 Outpatient Diabetes medications: Lantus  40 units BID, Humalog  0-15 units QID, Januvia 100 mg daily Current orders for Inpatient glycemic control: Novolog  0-9 units TID with meals, Novolog  0-5 units at bedtime Prednisone  60 mg daily   Inpatient Diabetes Program Recommendations:   Please consider: -Add Novolog  3 units TID with meals for meal coverage if patient eats at least 50% of meals.  Thank you, Aleathia Purdy E. Shalan Neault, RN, MSN, CDCES  Diabetes Coordinator Inpatient Glycemic Control Team Team Pager [phone] (8am-5pm) 04/20/2024 12:46 PM

## 2024-04-20 NOTE — Plan of Care (Signed)
  Problem: Metabolic: Goal: Ability to maintain appropriate glucose levels will improve Outcome: Progressing   Problem: Nutritional: Goal: Maintenance of adequate nutrition will improve Outcome: Progressing   Problem: Skin Integrity: Goal: Risk for impaired skin integrity will decrease Outcome: Progressing   Problem: Activity: Goal: Risk for activity intolerance will decrease Outcome: Progressing   Problem: Pain Managment: Goal: General experience of comfort will improve and/or be controlled Outcome: Progressing   Problem: Safety: Goal: Ability to remain free from injury will improve Outcome: Progressing

## 2024-04-20 NOTE — Progress Notes (Signed)
 Pt quite disagreeable and irritable today. Taking oxygen off while he eats, refusing IV sticks, cursing. Pt unconcerned about his low oxygen levels despite extensive education pt states, I know all about it. Leave me alone. I'm fine. I put his oxygen back on numerous times and his oxygen comes back up to 92%.

## 2024-04-20 NOTE — Progress Notes (Signed)
 PHARMACY - ANTICOAGULATION CONSULT NOTE  Pharmacy Consult for heparin  consult Indication: pulmonary embolus not ruled out  No Known Allergies  Patient Measurements: Height: 5' (152.4 cm) Weight: 117 kg (257 lb 15 oz) IBW/kg (Calculated) : 50 HEPARIN  DW (KG): 78.8  Vital Signs: Temp: 97.7 F (36.5 C) (07/28 1624) BP: 138/73 (07/28 1624) Pulse Rate: 71 (07/28 1624)  Labs: Recent Labs    04/18/24 0511 04/19/24 0606 04/19/24 0950 04/20/24 0456 04/20/24 0912 04/20/24 1809  HGB 9.6* 9.9*  --   --  9.8*  --   HCT 30.4* 31.6*  --   --  31.0*  --   PLT 169 176  --   --  194  --   APTT  --   --   --   --  31  --   LABPROT  --   --   --   --  14.4  --   INR  --   --   --   --  1.1  --   HEPARINUNFRC  --   --   --   --   --  0.34  CREATININE 3.59* 3.64*  --  4.05* 4.12* 3.93*  TROPONINIHS  --  10 14  --   --   --     Estimated Creatinine Clearance: 21.4 mL/min (A) (by C-G formula based on SCr of 3.93 mg/dL (H)).   Medical History: Past Medical History:  Diagnosis Date   Anxiety    Depression    Diabetes mellitus without complication (HCC)    Hepatitis C    Hyperlipidemia    IBS (irritable bowel syndrome)    Osteomyelitis (HCC)    Spinal stenosis     Medications:  No anticoagulation prior to admission  Assessment: 48 yom with past medical history of DM2, bilateral BKA, hep C, chronic suprapubic catheter, recurrent fecal impaction presented with concern for constipation. Pt was found to be in AKI. Pharmacy consulted to start heparin  for concern for a PE. (7/27 D-dimer 2.89), and is currently on HFNC 6 L/min. Plan for a V/Q scan. Last dose AC: heparin  sq 5000 units @ 0523. Hgb is slightly trending down (Hgb 11.2 > 10 > 9.9), possibly due to hemodilution. Pt received fluids 7/25 and 7/26.    Goal of Therapy:  Heparin  level 0.3-0.7 units/ml Monitor platelets by anticoagulation protocol: Yes   Plan: heparin  level therapeutic x 1 ---continue heparin  infusion at 1250  units/hr. ---Check anti-Xa level in 8 hours to confirm ---Continue to monitor H&H and platelets.  Eugene [name] 04/20/2024,6:37 PM

## 2024-04-20 NOTE — Progress Notes (Signed)
 PHARMACY - ANTICOAGULATION CONSULT NOTE  Pharmacy Consult for heparin  consult Indication: pulmonary embolus not ruled out  No Known Allergies  Patient Measurements: Height: 5' (152.4 cm) Weight: 117 kg (257 lb 15 oz) IBW/kg (Calculated) : 50 HEPARIN  DW (KG): 78.8  Vital Signs: Temp: 97.9 F (36.6 C) (07/28 1231) BP: 120/60 (07/28 1231) Pulse Rate: 72 (07/28 1231)  Labs: Recent Labs    04/18/24 0511 04/19/24 0606 04/19/24 0950 04/20/24 0456 04/20/24 0912  HGB 9.6* 9.9*  --   --  9.8*  HCT 30.4* 31.6*  --   --  31.0*  PLT 169 176  --   --  194  APTT  --   --   --   --  31  LABPROT  --   --   --   --  14.4  INR  --   --   --   --  1.1  CREATININE 3.59* 3.64*  --  4.05* 4.12*  TROPONINIHS  --  10 14  --   --     Estimated Creatinine Clearance: 20.5 mL/min (A) (by C-G formula based on SCr of 4.12 mg/dL (H)).   Medical History: Past Medical History:  Diagnosis Date   Anxiety    Depression    Diabetes mellitus without complication (HCC)    Hepatitis C    Hyperlipidemia    IBS (irritable bowel syndrome)    Osteomyelitis (HCC)    Spinal stenosis     Medications:  No anticoagulation prior to admission  Assessment: 40 yom with past medical history of DM2, bilateral BKA, hep C, chronic suprapubic catheter, recurrent fecal impaction presented with concern for constipation. Pt was found to be in AKI. Pharmacy consulted to start heparin  for concern for a PE. (7/27 D-dimer 2.89), and is currently on HFNC 6 L/min. Plan for a V/Q scan. Last dose AC: heparin  sq 5000 units @ 0523. Hgb is slightly trending down (Hgb 11.2 > 10 > 9.9), possibly due to hemodilution. Pt received fluids 7/25 and 7/26.    Goal of Therapy:  Heparin  level 0.3-0.7 units/ml Monitor platelets by anticoagulation protocol: Yes   Plan:  Patient's BMI 50.38, heparin  dosing weight 78.8 kg. Plan to give 6000 units bolus x 1, and start heparin  infusion at 1250 units/hr.  Check anti-Xa level 8 hours after  heparin  initiation, and daily while on heparin . Continue to monitor H&H and platelets.  Leonor BROCKS Cobie Leidner 04/20/2024,3:43 PM

## 2024-04-20 NOTE — Progress Notes (Signed)
 PHARMACY - ANTICOAGULATION CONSULT NOTE  Pharmacy Consult for heparin  consult Indication: pulmonary embolus not ruled out  No Known Allergies  Patient Measurements: Height: 5' (152.4 cm) Weight: 117 kg (257 lb 15 oz) IBW/kg (Calculated) : 50 HEPARIN  DW (KG): 78.8  Vital Signs: Temp: 97.5 F (36.4 C) (07/28 0805) BP: 108/57 (07/28 0805) Pulse Rate: 75 (07/28 0805)  Labs: Recent Labs    04/18/24 0511 04/19/24 0606 04/19/24 0950 04/20/24 0456  HGB 9.6* 9.9*  --   --   HCT 30.4* 31.6*  --   --   PLT 169 176  --   --   CREATININE 3.59* 3.64*  --  4.05*  TROPONINIHS  --  10 14  --     Estimated Creatinine Clearance: 20.8 mL/min (A) (by C-G formula based on SCr of 4.05 mg/dL (H)).   Medical History: Past Medical History:  Diagnosis Date   Anxiety    Depression    Diabetes mellitus without complication (HCC)    Hepatitis C    Hyperlipidemia    IBS (irritable bowel syndrome)    Osteomyelitis (HCC)    Spinal stenosis     Medications:  No anticoagulation prior to admission  Assessment: 50 yom with past medical history of DM2, bilateral BKA, hep C, chronic suprapubic catheter, recurrent fecal impaction presented with concern for constipation. Pt was found to be in AKI. Pharmacy consulted to start heparin  for concern for a PE. (7/27 D-dimer 2.89), and is currently on HFNC 6 L/min. Plan for a V/Q scan. Last dose AC: heparin  sq 5000 units @ 0523. Hgb is slightly trending down (Hgb 11.2 > 10 > 9.9), possibly due to hemodilution. Pt received fluids 7/25 and 7/26.    Goal of Therapy:  Heparin  level 0.3-0.7 units/ml Monitor platelets by anticoagulation protocol: Yes   Plan:  Patient's BMI 50.38, heparin  dosing weight 78.8 kg. Plan to give 6000 units bolus x 1, and start heparin  infusion at 1250 units/hr.  Check anti-Xa level 8 hours after heparin  initiation, and daily while on heparin . Continue to monitor H&H and platelets.  Leonor BROCKS Braedyn Kauk 04/20/2024,8:19 AM

## 2024-04-21 ENCOUNTER — Inpatient Hospital Stay

## 2024-04-21 DIAGNOSIS — Z9359 Other cystostomy status: Secondary | ICD-10-CM | POA: Diagnosis not present

## 2024-04-21 DIAGNOSIS — J9601 Acute respiratory failure with hypoxia: Secondary | ICD-10-CM | POA: Diagnosis not present

## 2024-04-21 DIAGNOSIS — E875 Hyperkalemia: Secondary | ICD-10-CM | POA: Diagnosis not present

## 2024-04-21 DIAGNOSIS — N179 Acute kidney failure, unspecified: Secondary | ICD-10-CM | POA: Diagnosis not present

## 2024-04-21 LAB — COMPREHENSIVE METABOLIC PANEL WITH GFR
ALT: 14 U/L (ref 0–44)
AST: 17 U/L (ref 15–41)
Albumin: 2.1 g/dL — ABNORMAL LOW (ref 3.5–5.0)
Alkaline Phosphatase: 51 U/L (ref 38–126)
Anion gap: 8 (ref 5–15)
BUN: 74 mg/dL — ABNORMAL HIGH (ref 8–23)
CO2: 21 mmol/L — ABNORMAL LOW (ref 22–32)
Calcium: 7.6 mg/dL — ABNORMAL LOW (ref 8.9–10.3)
Chloride: 109 mmol/L (ref 98–111)
Creatinine, Ser: 3.96 mg/dL — ABNORMAL HIGH (ref 0.61–1.24)
GFR, Estimated: 16 mL/min — ABNORMAL LOW (ref >60)
Glucose, Bld: 340 mg/dL — ABNORMAL HIGH (ref 70–99)
Potassium: 5.6 mmol/L — ABNORMAL HIGH (ref 3.5–5.1)
Sodium: 138 mmol/L (ref 135–145)
Total Bilirubin: 0.3 mg/dL (ref 0.0–1.2)
Total Protein: 6.7 g/dL (ref 6.5–8.1)

## 2024-04-21 LAB — BASIC METABOLIC PANEL WITH GFR
Anion gap: 9 (ref 5–15)
BUN: 76 mg/dL — ABNORMAL HIGH (ref 8–23)
CO2: 19 mmol/L — ABNORMAL LOW (ref 22–32)
Calcium: 7.3 mg/dL — ABNORMAL LOW (ref 8.9–10.3)
Chloride: 106 mmol/L (ref 98–111)
Creatinine, Ser: 3.99 mg/dL — ABNORMAL HIGH (ref 0.61–1.24)
GFR, Estimated: 16 mL/min — ABNORMAL LOW (ref >60)
Glucose, Bld: 317 mg/dL — ABNORMAL HIGH (ref 70–99)
Potassium: 5.2 mmol/L — ABNORMAL HIGH (ref 3.5–5.1)
Sodium: 134 mmol/L — ABNORMAL LOW (ref 135–145)

## 2024-04-21 LAB — CBC
HCT: 31.5 % — ABNORMAL LOW (ref 39.0–52.0)
Hemoglobin: 9.9 g/dL — ABNORMAL LOW (ref 13.0–17.0)
MCH: 29.1 pg (ref 26.0–34.0)
MCHC: 31.4 g/dL (ref 30.0–36.0)
MCV: 92.6 fL (ref 80.0–100.0)
Platelets: 210 K/uL (ref 150–400)
RBC: 3.4 MIL/uL — ABNORMAL LOW (ref 4.22–5.81)
RDW: 13.5 % (ref 11.5–15.5)
WBC: 8.7 K/uL (ref 4.0–10.5)
nRBC: 0 % (ref 0.0–0.2)

## 2024-04-21 LAB — HEPARIN LEVEL (UNFRACTIONATED): Heparin Unfractionated: 0.3 [IU]/mL (ref 0.30–0.70)

## 2024-04-21 LAB — GLUCOSE, CAPILLARY
Glucose-Capillary: 334 mg/dL — ABNORMAL HIGH (ref 70–99)
Glucose-Capillary: 294 mg/dL — ABNORMAL HIGH (ref 70–99)
Glucose-Capillary: 306 mg/dL — ABNORMAL HIGH (ref 70–99)
Glucose-Capillary: 271 mg/dL — ABNORMAL HIGH (ref 70–99)

## 2024-04-21 LAB — BLOOD GAS, VENOUS
Acid-base deficit: 1.6 mmol/L (ref 0.0–2.0)
Bicarbonate: 21.7 mmol/L (ref 20.0–28.0)
Patient temperature: 37
Patient temperature: 37
pCO2, Ven: 32 mmHg — ABNORMAL LOW (ref 44–60)
pH, Ven: 7.44 — ABNORMAL HIGH (ref 7.25–7.43)
pO2, Ven: 76 mmHg — ABNORMAL HIGH (ref 32–45)

## 2024-04-21 LAB — MAGNESIUM: Magnesium: 1.8 mg/dL (ref 1.7–2.4)

## 2024-04-21 LAB — PHOSPHORUS: Phosphorus: 5.9 mg/dL — ABNORMAL HIGH (ref 2.5–4.6)

## 2024-04-21 MED ORDER — FUROSEMIDE 10 MG/ML IJ SOLN
40.0000 mg | Freq: Two times a day (BID) | INTRAMUSCULAR | Status: DC
  Administered 2024-04-21 – 2024-04-22 (×4): 40 mg via INTRAVENOUS
  Filled 2024-04-21 (×4): qty 4

## 2024-04-21 MED ORDER — POLYETHYLENE GLYCOL 3350 17 G PO PACK
17.0000 g | PACK | Freq: Every day | ORAL | Status: DC
  Administered 2024-04-21 – 2024-04-23 (×2): 17 g via ORAL
  Filled 2024-04-21 (×3): qty 1

## 2024-04-21 MED ORDER — SODIUM BICARBONATE 650 MG PO TABS
650.0000 mg | ORAL_TABLET | Freq: Three times a day (TID) | ORAL | Status: DC
  Administered 2024-04-21 – 2024-04-23 (×2): 650 mg via ORAL
  Filled 2024-04-21 (×3): qty 1

## 2024-04-21 MED ORDER — INSULIN GLARGINE-YFGN 100 UNIT/ML ~~LOC~~ SOLN
10.0000 [IU] | SUBCUTANEOUS | Status: DC
  Administered 2024-04-21 – 2024-05-01 (×10): 10 [IU] via SUBCUTANEOUS
  Filled 2024-04-21 (×11): qty 0.1

## 2024-04-21 MED ORDER — ALBUMIN HUMAN 25 % IV SOLN
12.5000 g | Freq: Four times a day (QID) | INTRAVENOUS | Status: AC
  Administered 2024-04-21 – 2024-04-23 (×7): 12.5 g via INTRAVENOUS
  Filled 2024-04-21 (×8): qty 50

## 2024-04-21 MED ORDER — ENOXAPARIN SODIUM 30 MG/0.3ML IJ SOSY
30.0000 mg | PREFILLED_SYRINGE | INTRAMUSCULAR | Status: DC
  Administered 2024-04-21 – 2024-05-01 (×10): 30 mg via SUBCUTANEOUS
  Filled 2024-04-21 (×10): qty 0.3

## 2024-04-21 NOTE — Plan of Care (Signed)
  Problem: Fluid Volume: Goal: Ability to maintain a balanced intake and output will improve Outcome: Progressing   Problem: Nutritional: Goal: Maintenance of adequate nutrition will improve Outcome: Progressing   Problem: Clinical Measurements: Goal: Will remain free from infection Outcome: Progressing Goal: Respiratory complications will improve Outcome: Progressing   Problem: Elimination: Goal: Will not experience complications related to bowel motility Outcome: Progressing   Problem: Pain Managment: Goal: General experience of comfort will improve and/or be controlled Outcome: Progressing   Problem: Safety: Goal: Ability to remain free from injury will improve Outcome: Progressing   Problem: Coping: Goal: Ability to adjust to condition or change in health will improve Outcome: Not Progressing   Problem: Health Behavior/Discharge Planning: Goal: Ability to identify and utilize available resources and services will improve Outcome: Not Progressing   Problem: Nutritional: Goal: Progress toward achieving an optimal weight will improve Outcome: Not Progressing   Problem: Skin Integrity: Goal: Risk for impaired skin integrity will decrease Outcome: Not Progressing   Problem: Skin Integrity: Goal: Risk for impaired skin integrity will decrease Outcome: Not Progressing

## 2024-04-21 NOTE — Plan of Care (Signed)
  Problem: Coping: Goal: Ability to adjust to condition or change in health will improve Outcome: Progressing   Problem: Fluid Volume: Goal: Ability to maintain a balanced intake and output will improve Outcome: Progressing   Problem: Nutritional: Goal: Maintenance of adequate nutrition will improve Outcome: Progressing   Problem: Skin Integrity: Goal: Risk for impaired skin integrity will decrease Outcome: Progressing   Problem: Nutrition: Goal: Adequate nutrition will be maintained Outcome: Progressing   Problem: Safety: Goal: Ability to remain free from injury will improve Outcome: Progressing

## 2024-04-21 NOTE — Progress Notes (Addendum)
 Progress Note   Patient: Eugene [name] FMW:[id number] DOB: 12-16-1960 DOA: 04/14/2024  DOS: the patient was seen and examined on 04/21/2024   Brief hospital course:  63 y.o. male with medical history significant of DM2, bilateral BKA, hep C, chronic suprapubic cath, recurrent fecal impaction who was sent from SNF, found to have AKI.   Assessment and Plan:   Acute acute respiratory failure - Acute episode of hypoxia 7/26.  Initially requiring 8 L nasal cannula, now down to 4-5 L.  Denies any frank vomiting or inciting event.  V/Q ruling out PE so we will discontinue empiric heparin  drip.  Chest x-ray personally reviewed does show bilateral effusions and congestion.  ECHO normal.  Will restart IV Lasix  40 mg every 12 hours along with scheduled albumin .  Will order chest CT.  Monitor respiratory function closely.  Continue to wean O2 as tolerated.  Likely acute exacerbation of HFpEF - Likely exacerbated by volume resuscitation for AKI.  Echo noting normal EF.  Chest x-ray personally reviewed noting congestions and effusions.  Will order chest CT.  Diuretics as above.  Monitor urine output closely.  Acute kidney injury - Creatinine 4.23 on presentation, unknown current baseline.  Last creatinine on record 0.84 in July 2022.  Likely exacerbated by diuresis.  Renal ultrasound normal.  Will restart IV Lasix  40 mg every 12 hours along with scheduled albumin .  Monitor I's and O's.  Will recheck BMP and magnesium  in AM.  Mixed acid-base disorder - Likely acidemia from AKI/CKD.  Acidemia initially resolved after bicarb drip.  Acidemia precarious.   Mild hyperkalemia - Potassium five 6.2.  Showed improvement after initial D50, IV insulin  10 units, fluid bolus, Lasix , Lokelma .  AKI/CKD and acidemia.  Ordered another round of above.  Will recheck BMP and magnesium  later this afternoon and in AM.   Chronic constipation - Responded well to scheduled Dulcolax with as needed suppository, as needed enema.   Constipation resolved.  Will order daily MiraLAX .  Lokelma  as above.   Possible urinary tract infection with chronic suprapubic catheter (POA) - Patient with noted dysuria.  UA noting leukocytes, WBCs, bacteria.  Urine culture noting Proteus as well as small colonies of staph aureus.  Suprapubic catheter replaced 7/26.  Transition from ceftriaxone  to Keflex  (antibiotic day 6 of 7).   diabetes mellitus, uncontrolled - A1c 6.0 suggesting excellent control prior to admission.  Not well-controlled with inpatient, likely exacerbated by prednisone  use.  Initiated insulin  glargine 10 units.  Insulin  sliding scale board.  Increase meal coverage to 3 units.   Chronic opioid use - Likely exacerbating AKI and constipation.  Continue home oxycodone  5 mg 3 times daily as needed.   Acute nausea and vomiting - No nausea or vomiting since yesterday morning.  Etiology may be secondary to CAUTI.  Antiemetics on board.  Treatment as above.     Subjective: Patient resting comfortably this morning.  On 4-5 L nasal cannula, slightly improved from yesterday.  Urine output looks excellent this morning.  Denies any nausea/vomiting since yesterday.  No chest pain, purulent sputum, fevers, abdominal pain.  Physical Exam:  Vitals:   04/21/24 0518 04/21/24 0600 04/21/24 0750 04/21/24 1137  BP: (!) 142/86  126/66 (!) 149/75  Pulse: 76  71 72  Resp: 20     Temp: 97.7 F (36.5 C)   97.7 F (36.5 C)  TempSrc:      SpO2: 91% 94% 92% 92%  Weight:      Height:  GENERAL:  Alert, pleasant, obese HEENT:  EOMI, nasal cannula CARDIOVASCULAR:  RRR, no murmurs appreciated RESPIRATORY: Poor air movement bilaterally  GASTROINTESTINAL:  Soft, nontender, nondistended, suprapubic catheter EXTREMITIES: BL BKA NEURO:  No new focal deficits appreciated SKIN:  No rashes noted PSYCH:  Appropriate mood and affect    Data Reviewed:  Imaging Studies: US  RENAL Result Date: 04/20/2024 CLINICAL DATA:  Acute kidney  injury EXAM: RENAL / URINARY TRACT ULTRASOUND COMPLETE COMPARISON:  04/14/2024 CT scan FINDINGS: Right Kidney: Renal measurements: 11.7 by 6.5 by 5.8 cm = volume: 228 mL. Echogenicity within normal limits. No mass or hydronephrosis visualized. Left Kidney: Renal measurements: 13.0 by 6.5 by 5.5 cm = volume: 240 mL. Echogenicity within normal limits. No mass or hydronephrosis visualized. Bladder: Empty, suprapubic catheter in place. Other: None. IMPRESSION: 1. Normal sonographic appearance of the kidneys. 2. Empty bladder, suprapubic catheter in place. Electronically Signed   By: Ryan Salvage M.D.   On: 04/20/2024 18:30   NM Pulmonary Perfusion Result Date: 04/20/2024 CLINICAL DATA:  Concern for pulmonary embolism. Elevated D-dimer. Bilateral effusions EXAM: NUCLEAR MEDICINE PERFUSION LUNG SCAN TECHNIQUE: Perfusion images were obtained in multiple projections after intravenous injection of radiopharmaceutical. RADIOPHARMACEUTICALS:  4.1 mCi Tc-72m MAA COMPARISON:  Radiograph 03/20/2024 FINDINGS: No wedge-shaped peripheral perfusion defects to suggest acute pulmonary edema. Increased attenuation LEFT lower lobe related to large effusion. IMPRESSION: No evidence acute pulmonary disease. LEFT pleural effusion. Electronically Signed   By: Jackquline Boxer M.D.   On: 04/20/2024 16:17   ECHOCARDIOGRAM COMPLETE Result Date: 04/19/2024    ECHOCARDIOGRAM REPORT   [name] Date of Exam: 04/19/2024 Medical Rec #:  [id number]      Height:       60.0 in Accession #:    [id number]     Weight:       257.9 lb Date of Birth:  02-Feb-1961      BSA:          2.080 m Patient Age:    63 years       BP:           153/94 mmHg Patient Gender: M              HR:           84 bpm. Exam Location:  [facility] Procedure: 2D Echo, Cardiac Doppler, Color Doppler and Intracardiac            Opacification Agent (Both Spectral and Color Flow Doppler were            utilized during procedure). Indications:     CHF I50.9   History:         Patient has prior history of Echocardiogram examinations, most                  recent 12/10/2019. Risk Factors:Hypertension, Diabetes,                  Dyslipidemia and Current Smoker.  Sonographer:     Thea Norlander RCS Referring Phys:  [id number] Harleigh Civello LELON CANALES Diagnosing Phys: Dwayne D Callwood MD IMPRESSIONS  1. Left ventricular ejection fraction, by estimation, is 55 to 60%. The left ventricle has normal function. The left ventricle has no regional wall motion abnormalities. Left ventricular diastolic function could not be evaluated.  2. Right ventricular systolic function is normal. The right ventricular size is not well visualized.  3. The mitral valve is normal in structure. No evidence of mitral valve  regurgitation.  4. The aortic valve is normal in structure. Aortic valve regurgitation is not visualized. Conclusion(s)/Recommendation(s): Poor windows for evaluation of left ventricular function by transthoracic echocardiography. Would recommend an alternative means of evaluation. FINDINGS  Left Ventricle: Left ventricular ejection fraction, by estimation, is 55 to 60%. The left ventricle has normal function. The left ventricle has no regional wall motion abnormalities. Definity  contrast agent was given IV to delineate the left ventricular  endocardial borders. Strain was performed and the global longitudinal strain is indeterminate. The left ventricular internal cavity size was normal in size. There is no left ventricular hypertrophy. Left ventricular diastolic function could not be evaluated. Right Ventricle: The right ventricular size is not well visualized. Right vetricular wall thickness was not assessed. Right ventricular systolic function is normal. Left Atrium: Left atrial size was not assessed. Right Atrium: Right atrial size was not assessed. Pericardium: There is no evidence of pericardial effusion. Mitral Valve: The mitral valve is normal in structure. No evidence of mitral valve  regurgitation. Tricuspid Valve: The tricuspid valve is normal in structure. Tricuspid valve regurgitation is trivial. Aortic Valve: The aortic valve is normal in structure. Aortic valve regurgitation is not visualized. Aortic valve peak gradient measures 5.4 mmHg. Pulmonic Valve: The pulmonic valve was not well visualized. Pulmonic valve regurgitation is not visualized. Aorta: Aortic root could not be assessed. IAS/Shunts: The interatrial septum was not assessed. Additional Comments: 3D was performed not requiring image post processing on an independent workstation and was indeterminate.  LEFT VENTRICLE PLAX 2D LVOT diam:     2.40 cm   Diastology LV SV:         89        LV e' medial:    8.16 cm/s LV SV Index:   43        LV E/e' medial:  10.6 LVOT Area:     4.52 cm  LV e' lateral:   9.90 cm/s                          LV E/e' lateral: 8.7  LEFT ATRIUM             Index LA Vol (A2C):   41.9 ml 20.15 ml/m LA Vol (A4C):   56.3 ml 27.07 ml/m LA Biplane Vol: 48.7 ml 23.42 ml/m  AORTIC VALVE AV Area (Vmax): 3.80 cm AV Vmax:        116.00 cm/s AV Peak Grad:   5.4 mmHg LVOT Vmax:      97.50 cm/s LVOT Vmean:     62.900 cm/s LVOT VTI:       0.196 m  AORTA Ao Root diam: 4.10 cm Ao Asc diam:  3.70 cm MITRAL VALVE MV Area (PHT): 4.68 cm    SHUNTS MV Decel Time: 162 msec    Systemic VTI:  0.20 m MV E velocity: 86.50 cm/s  Systemic Diam: 2.40 cm MV A velocity: 80.70 cm/s MV E/A ratio:  1.07 Dwayne JONETTA Lovelace MD Electronically signed by Cara JONETTA Lovelace MD Signature Date/Time: 04/19/2024/1:37:58 PM    Final    DG Chest Port 1 View Result Date: 04/19/2024 CLINICAL DATA:  [id number].  Hypoxia. EXAM: PORTABLE CHEST 1 VIEW COMPARISON:  CT 04/14/2024 FINDINGS: Small pleural effusions are again noted with overlying opacities on the left-greater-than-right. On CT this appeared largely due to rounded atelectasis. The radiographic appearance is nonspecific. The opacities merge with coarse linear atelectatic markings converging on the  hila. The upper  lung fields are clear. There is mild cardiomegaly without CHF. The mediastinum is normally outlined. There is thoracic spine fusion hardware and a metallic cylinder graft in the lower thoracic spine. No new abnormality. IMPRESSION: 1. Small pleural effusions with overlying opacities on the left-greater-than-right. On CT this appeared largely due to rounded atelectasis. The radiographic appearance is nonspecific, but the overall aeration seems unchanged. 2. Mild cardiomegaly without CHF. Electronically Signed   By: Francis Quam M.D.   On: 04/19/2024 06:52   CT CHEST ABDOMEN PELVIS WO CONTRAST Result Date: 04/14/2024 CLINICAL DATA:  Constipation. Abdominal pain. Dilated bowel loops and abnormal chest radiograph. EXAM: CT CHEST, ABDOMEN AND PELVIS WITHOUT CONTRAST TECHNIQUE: Multidetector CT imaging of the chest, abdomen and pelvis was performed following the standard protocol without IV contrast. RADIATION DOSE REDUCTION: This exam was performed according to the departmental dose-optimization program which includes automated exposure control, adjustment of the mA and/or kV according to patient size and/or use of iterative reconstruction technique. COMPARISON:  AP CT on 04/17/2021 FINDINGS: CT CHEST FINDINGS Cardiovascular: No acute findings. Mediastinum/Lymph Nodes: No masses or pathologically enlarged lymph nodes identified on this unenhanced exam. Lungs/Pleura: Small bilateral pleural effusions of pleural thickening. Bilateral pleural-parenchymal scarring greatest in lower lobes, with areas of rounded atelectasis in both lower lobes. Musculoskeletal: No suspicious bone lesions identified. Thoracic spine fusion hardware noted. CT ABDOMEN AND PELVIS FINDINGS Hepatobiliary: No masses visualized on this unenhanced exam. Mildly distended gallbladder, without other signs of cholecystitis or biliary ductal dilatation. Pancreas: No mass or inflammatory changes identified on this unenhanced exam. Spleen:   Within normal limits in size. Adrenals/Urinary Tract: No evidence of urolithiasis or hydronephrosis. Suprapubic bladder catheter in appropriate position. Stomach/Bowel: No evidence of obstruction, inflammatory process, or abnormal fluid collections. Moderate colonic stool burden noted. Small umbilical hernia is seen which contains only fat. Vascular/Lymphatic: No pathologically enlarged lymph nodes identified. No abdominal aortic aneurysm. Reproductive:  No masses or other significant abnormality. Other:  None. Musculoskeletal:  No suspicious bone lesions identified. IMPRESSION: No evidence of bowel obstruction or other acute findings within the abdomen or pelvis. Small bilateral pleural effusions with pleural thickening. Bilateral pleural-parenchymal scarring, with areas of rounded atelectasis in both lower lobes. Small umbilical hernia, which contains only fat. Electronically Signed   By: Norleen DELENA Kil M.D.   On: 04/14/2024 09:11    Results are pending, will review when available.  Previous records (including but not limited to H&P, progress notes, nursing notes, TOC management) were reviewed in assessment of this patient.  Labs: CBC: Recent Labs  Lab 04/17/24 0442 04/18/24 0511 04/19/24 0606 04/20/24 0912 04/21/24 0530  WBC 5.6 5.4 6.8 8.1 8.7  HGB 10.0* 9.6* 9.9* 9.8* 9.9*  HCT 30.6* 30.4* 31.6* 31.0* 31.5*  MCV 90.5 93.5 94.9 93.1 92.6  PLT 168 169 176 194 210   Basic Metabolic Panel: Recent Labs  Lab 04/16/24 0357 04/17/24 0442 04/18/24 0511 04/19/24 0606 04/20/24 0456 04/20/24 0912 04/20/24 1809 04/21/24 0530  NA 140 140 140 141 138 143 137 138  K 5.4* 5.0 4.7 4.9 6.2* 5.8* 6.4* 5.6*  CL 112* 111 111 113* 111 112* 111 109  CO2 20* 24 21* 22 16* 20* 17* 21*  GLUCOSE 214* 213* 157* 189* 262* 229* 310* 340*  BUN 52* 51* 45* 46* 65* 64* 66* 74*  CREATININE 3.82* 3.69* 3.59* 3.64* 4.05* 4.12* 3.93* 3.96*  CALCIUM  7.5* 7.2* 7.0* 7.2* 7.5* 7.5* 7.5* 7.6*  MG 1.8 1.9 1.7 1.8  2.0  --   --  1.8  PHOS 4.8* 4.5  --   --   --   --   --  5.9*   Liver Function Tests: Recent Labs  Lab 04/21/24 0530  AST 17  ALT 14  ALKPHOS 51  BILITOT 0.3  PROT 6.7  ALBUMIN  2.1*   CBG: Recent Labs  Lab 04/20/24 1232 04/20/24 1636 04/20/24 2153 04/21/24 0750 04/21/24 1138  GLUCAP 269* 304* 331* 334* 294*    Scheduled Meds:  amLODipine   10 mg Oral Daily   budesonide  (PULMICORT ) nebulizer solution  0.25 mg Nebulization BID   cephALEXin   500 mg Oral Q8H   Chlorhexidine  Gluconate Cloth  6 each Topical Q0600   enoxaparin  (LOVENOX ) injection  30 mg Subcutaneous Q24H   furosemide   40 mg Intravenous Q12H   gabapentin   300 mg Oral QHS   insulin  aspart  0-5 Units Subcutaneous QHS   insulin  aspart  0-9 Units Subcutaneous TID WC   insulin  aspart  3 Units Subcutaneous TID WC   insulin  glargine-yfgn  10 Units Subcutaneous Q24H   levothyroxine   175 mcg Oral Q0600   linaclotide   145 mcg Oral QAC breakfast   melatonin  10 mg Oral QHS   metoprolol  succinate  25 mg Oral Daily   nicotine   21 mg Transdermal Daily   polyethylene glycol  17 g Oral Daily   predniSONE   60 mg Oral Q breakfast   senna-docusate  1 tablet Oral BID   Continuous Infusions:  albumin  human 60 mL/hr at 04/21/24 1005   PRN Meds:.bisacodyl , hydrocortisone  cream, ipratropium-albuterol , ondansetron  (ZOFRAN ) IV, oxyCODONE , sodium phosphate , traZODone   Family Communication: None at bedside  Disposition: Status is: Inpatient Remains inpatient appropriate because: Acute hypoxic respiratory failure     Time spent: 52 minutes  Length of inpatient stay: 6 days  Author: Carliss LELON Canales, DO 04/21/2024 1:49 PM  For on call review [email or web address].

## 2024-04-21 NOTE — Progress Notes (Signed)
 PHARMACY - ANTICOAGULATION CONSULT NOTE  Pharmacy Consult for heparin  consult Indication: pulmonary embolus not ruled out  No Known Allergies  Patient Measurements: Height: 5' (152.4 cm) Weight: 117 kg (257 lb 15 oz) IBW/kg (Calculated) : 50 HEPARIN  DW (KG): 78.8  Vital Signs: Temp: 97.7 F (36.5 C) (07/29 0518) Temp Source: Oral (07/28 2000) BP: 142/86 (07/29 0518) Pulse Rate: 76 (07/29 0518)  Labs: Recent Labs    04/19/24 0606 04/19/24 0950 04/20/24 0456 04/20/24 0912 04/20/24 1809 04/21/24 0530  HGB 9.9*  --   --  9.8*  --  9.9*  HCT 31.6*  --   --  31.0*  --  31.5*  PLT 176  --   --  194  --  210  APTT  --   --   --  31  --   --   LABPROT  --   --   --  14.4  --   --   INR  --   --   --  1.1  --   --   HEPARINUNFRC  --   --   --   --  0.34 0.30  CREATININE 3.64*  --  4.05* 4.12* 3.93*  --   TROPONINIHS 10 14  --   --   --   --     Estimated Creatinine Clearance: 21.4 mL/min (A) (by C-G formula based on SCr of 3.93 mg/dL (H)).   Medical History: Past Medical History:  Diagnosis Date   Anxiety    Depression    Diabetes mellitus without complication (HCC)    Hepatitis C    Hyperlipidemia    IBS (irritable bowel syndrome)    Osteomyelitis (HCC)    Spinal stenosis     Medications:  No anticoagulation prior to admission  Assessment: 63 yom with past medical history of DM2, bilateral BKA, hep C, chronic suprapubic catheter, recurrent fecal impaction presented with concern for constipation. Pt was found to be in AKI. Pharmacy consulted to start heparin  for concern for a PE. (7/27 D-dimer 2.89), and is currently on HFNC 6 L/min. Plan for a V/Q scan. Last dose AC: heparin  sq 5000 units @ 0523. Hgb is slightly trending down (Hgb 11.2 > 10 > 9.9), possibly due to hemodilution. Pt received fluids 7/25 and 7/26.    Goal of Therapy:  Heparin  level 0.3-0.7 units/ml Monitor platelets by anticoagulation protocol: Yes   Plan:  7/29:  HL @ 0530 = 0.30, therapeutic X  2 - HL is therapeutic but at lowest end of range and trending down so will increase drip rate to 1400 units/hr - recheck HL on 7/30 with AM labs - CBC daily   Nissi Doffing D 04/21/2024,5:55 AM

## 2024-04-21 NOTE — Inpatient Diabetes Management (Signed)
 Inpatient Diabetes Program Recommendations  AACE/ADA: New Consensus Statement on Inpatient Glycemic Control (2015)  Target Ranges:  Prepandial:   less than 140 mg/dL      Peak postprandial:   less than 180 mg/dL (1-2 hours)      Critically ill patients:  140 - 180 mg/dL   Lab Results  Component Value Date   GLUCAP 334 (H) 04/21/2024   HGBA1C 6.0 (H) 04/14/2024    Latest Reference Range & Units 04/20/24 08:06 04/20/24 12:32 04/20/24 16:36 04/20/24 21:53 04/21/24 07:50  Glucose-Capillary 70 - 99 mg/dL 774 (H) 730 (H) 695 (H) 331 (H) 334 (H)  (H): Data is abnormally high  Diabetes history: DM2 Outpatient Diabetes medications: Lantus  40 units BID, Humalog  0-15 units QID, Januvia 100 mg daily Current orders for Inpatient glycemic control: Semglee  10 units,  Novolog  3 units tid meal coverage  Novolog  0-9 units TID with meals, Novolog  0-5 units at bedtime Prednisone  60 mg daily  Inpatient Diabetes Program Recommendations:   Please consider: -Increase Novolog  meal coverage to 6 units tid if eats 50%  Thank you, Dagoberto E. Yolanda Huffstetler, RN, MSN, CDCES  Diabetes Coordinator Inpatient Glycemic Control Team Team Pager [phone] (8am-5pm) 04/21/2024 10:24 AM

## 2024-04-22 ENCOUNTER — Inpatient Hospital Stay

## 2024-04-22 DIAGNOSIS — N179 Acute kidney failure, unspecified: Secondary | ICD-10-CM | POA: Diagnosis not present

## 2024-04-22 LAB — BASIC METABOLIC PANEL WITH GFR
Anion gap: 12 (ref 5–15)
BUN: 81 mg/dL — ABNORMAL HIGH (ref 8–23)
CO2: 23 mmol/L (ref 22–32)
Calcium: 7.8 mg/dL — ABNORMAL LOW (ref 8.9–10.3)
Chloride: 107 mmol/L (ref 98–111)
Creatinine, Ser: 3.89 mg/dL — ABNORMAL HIGH (ref 0.61–1.24)
GFR, Estimated: 17 mL/min — ABNORMAL LOW (ref >60)
Glucose, Bld: 276 mg/dL — ABNORMAL HIGH (ref 70–99)
Potassium: 4.7 mmol/L (ref 3.5–5.1)
Sodium: 139 mmol/L (ref 135–145)

## 2024-04-22 LAB — CBC
HCT: 34.5 % — ABNORMAL LOW (ref 39.0–52.0)
Hemoglobin: 11.2 g/dL — ABNORMAL LOW (ref 13.0–17.0)
MCH: 29.5 pg (ref 26.0–34.0)
MCHC: 32.5 g/dL (ref 30.0–36.0)
MCV: 90.8 fL (ref 80.0–100.0)
Platelets: 269 K/uL (ref 150–400)
RBC: 3.8 MIL/uL — ABNORMAL LOW (ref 4.22–5.81)
RDW: 13.4 % (ref 11.5–15.5)
WBC: 16.1 K/uL — ABNORMAL HIGH (ref 4.0–10.5)
nRBC: 0 % (ref 0.0–0.2)

## 2024-04-22 LAB — GLUCOSE, CAPILLARY
Glucose-Capillary: 286 mg/dL — ABNORMAL HIGH (ref 70–99)
Glucose-Capillary: 279 mg/dL — ABNORMAL HIGH (ref 70–99)
Glucose-Capillary: 205 mg/dL — ABNORMAL HIGH (ref 70–99)
Glucose-Capillary: 175 mg/dL — ABNORMAL HIGH (ref 70–99)

## 2024-04-22 LAB — MAGNESIUM: Magnesium: 1.9 mg/dL (ref 1.7–2.4)

## 2024-04-22 MED ORDER — LABETALOL HCL 5 MG/ML IV SOLN
10.0000 mg | Freq: Once | INTRAVENOUS | Status: AC
  Administered 2024-04-22: 10 mg via INTRAVENOUS
  Filled 2024-04-22: qty 4

## 2024-04-22 MED ORDER — SODIUM CHLORIDE 0.9 % IV SOLN
2.0000 g | INTRAVENOUS | Status: DC
  Administered 2024-04-22 – 2024-04-27 (×6): 2 g via INTRAVENOUS
  Filled 2024-04-22 (×7): qty 20

## 2024-04-22 MED ORDER — SIMETHICONE 80 MG PO CHEW
80.0000 mg | CHEWABLE_TABLET | Freq: Four times a day (QID) | ORAL | Status: DC
  Administered 2024-04-23: 80 mg via ORAL
  Filled 2024-04-22: qty 1

## 2024-04-22 MED ORDER — SODIUM CHLORIDE 0.9 % IV SOLN: INTRAVENOUS | Status: AC

## 2024-04-22 MED ORDER — HYDRALAZINE HCL 20 MG/ML IJ SOLN
10.0000 mg | Freq: Four times a day (QID) | INTRAMUSCULAR | Status: DC | PRN
  Administered 2024-04-22 – 2024-04-24 (×5): 10 mg via INTRAVENOUS
  Filled 2024-04-22 (×5): qty 1

## 2024-04-22 MED ORDER — SODIUM CHLORIDE 0.9 % IV SOLN
12.5000 mg | Freq: Four times a day (QID) | INTRAVENOUS | Status: DC | PRN
  Administered 2024-04-22 – 2024-04-29 (×7): 12.5 mg via INTRAVENOUS
  Filled 2024-04-22: qty 0.5
  Filled 2024-04-22 (×2): qty 12.5
  Filled 2024-04-22: qty 0.5
  Filled 2024-04-22 (×3): qty 12.5
  Filled 2024-04-22: qty 0.5
  Filled 2024-04-22 (×2): qty 12.5

## 2024-04-22 MED ORDER — MORPHINE SULFATE (PF) 2 MG/ML IV SOLN
1.0000 mg | INTRAVENOUS | Status: DC | PRN
  Administered 2024-04-22 – 2024-04-28 (×17): 1 mg via INTRAVENOUS
  Filled 2024-04-22 (×17): qty 1

## 2024-04-22 MED ORDER — DIATRIZOATE MEGLUMINE & SODIUM 66-10 % PO SOLN: 90.0000 mL | Freq: Once | ORAL | Status: DC

## 2024-04-22 NOTE — Plan of Care (Signed)
 Problem: Education: Goal: Ability to describe self-care measures that may prevent or decrease complications (Diabetes Survival Skills Education) will improve 04/22/2024 1818 by Anice Troy BIRCH, RN Outcome: Not Progressing 04/22/2024 1818 by Anice Troy BIRCH, RN Outcome: Progressing Goal: Individualized Educational Video(s) 04/22/2024 1818 by Anice Troy BIRCH, RN Outcome: Not Progressing 04/22/2024 1818 by Anice Troy BIRCH, RN Outcome: Progressing   Problem: Coping: Goal: Ability to adjust to condition or change in health will improve 04/22/2024 1818 by Anice Troy BIRCH, RN Outcome: Not Progressing 04/22/2024 1818 by Anice Troy BIRCH, RN Outcome: Progressing   Problem: Fluid Volume: Goal: Ability to maintain a balanced intake and output will improve 04/22/2024 1818 by Anice Troy BIRCH, RN Outcome: Not Progressing 04/22/2024 1818 by Anice Troy BIRCH, RN Outcome: Progressing   Problem: Health Behavior/Discharge Planning: Goal: Ability to identify and utilize available resources and services will improve 04/22/2024 1818 by Anice Troy BIRCH, RN Outcome: Not Progressing 04/22/2024 1818 by Anice Troy BIRCH, RN Outcome: Progressing Goal: Ability to manage health-related needs will improve 04/22/2024 1818 by Anice Troy BIRCH, RN Outcome: Not Progressing 04/22/2024 1818 by Anice Troy BIRCH, RN Outcome: Progressing   Problem: Metabolic: Goal: Ability to maintain appropriate glucose levels will improve 04/22/2024 1818 by Anice Troy BIRCH, RN Outcome: Not Progressing 04/22/2024 1818 by Anice Troy BIRCH, RN Outcome: Progressing   Problem: Nutritional: Goal: Maintenance of adequate nutrition will improve 04/22/2024 1818 by Anice Troy BIRCH, RN Outcome: Not Progressing 04/22/2024 1818 by Anice Troy BIRCH, RN Outcome: Progressing Goal: Progress toward achieving an optimal weight will improve 04/22/2024 1818 by Anice Troy BIRCH, RN Outcome: Not Progressing 04/22/2024 1818 by  Anice Troy BIRCH, RN Outcome: Progressing   Problem: Skin Integrity: Goal: Risk for impaired skin integrity will decrease 04/22/2024 1818 by Anice Troy BIRCH, RN Outcome: Not Progressing 04/22/2024 1818 by Anice Troy BIRCH, RN Outcome: Progressing   Problem: Tissue Perfusion: Goal: Adequacy of tissue perfusion will improve 04/22/2024 1818 by Anice Troy BIRCH, RN Outcome: Not Progressing 04/22/2024 1818 by Anice Troy BIRCH, RN Outcome: Progressing   Problem: Education: Goal: Knowledge of General Education information will improve Description: Including pain rating scale, medication(s)/side effects and non-pharmacologic comfort measures 04/22/2024 1818 by Anice Troy BIRCH, RN Outcome: Not Progressing 04/22/2024 1818 by Anice Troy BIRCH, RN Outcome: Progressing   Problem: Health Behavior/Discharge Planning: Goal: Ability to manage health-related needs will improve 04/22/2024 1818 by Anice Troy BIRCH, RN Outcome: Not Progressing 04/22/2024 1818 by Anice Troy BIRCH, RN Outcome: Progressing   Problem: Clinical Measurements: Goal: Ability to maintain clinical measurements within normal limits will improve 04/22/2024 1818 by Anice Troy BIRCH, RN Outcome: Not Progressing 04/22/2024 1818 by Anice Troy BIRCH, RN Outcome: Progressing Goal: Will remain free from infection 04/22/2024 1818 by Anice Troy BIRCH, RN Outcome: Not Progressing 04/22/2024 1818 by Anice Troy BIRCH, RN Outcome: Progressing Goal: Diagnostic test results will improve 04/22/2024 1818 by Anice Troy BIRCH, RN Outcome: Not Progressing 04/22/2024 1818 by Anice Troy BIRCH, RN Outcome: Progressing Goal: Respiratory complications will improve 04/22/2024 1818 by Anice Troy BIRCH, RN Outcome: Not Progressing 04/22/2024 1818 by Anice Troy BIRCH, RN Outcome: Progressing Goal: Cardiovascular complication will be avoided 04/22/2024 1818 by Anice Troy BIRCH, RN Outcome: Not Progressing 04/22/2024 1818 by Anice Troy BIRCH, RN Outcome: Progressing   Problem: Activity: Goal: Risk for activity intolerance will decrease 04/22/2024 1818 by Anice Troy BIRCH, RN Outcome: Not Progressing 04/22/2024 1818 by Anice Troy BIRCH, RN Outcome: Progressing   Problem: Nutrition: Goal: Adequate nutrition will be maintained  04/22/2024 1818 by Anice Troy BIRCH, RN Outcome: Not Progressing 04/22/2024 1818 by Anice Troy BIRCH, RN Outcome: Progressing   Problem: Coping: Goal: Level of anxiety will decrease 04/22/2024 1818 by Anice Troy BIRCH, RN Outcome: Not Progressing 04/22/2024 1818 by Anice Troy BIRCH, RN Outcome: Progressing   Problem: Elimination: Goal: Will not experience complications related to bowel motility 04/22/2024 1818 by Anice Troy BIRCH, RN Outcome: Not Progressing 04/22/2024 1818 by Anice Troy BIRCH, RN Outcome: Progressing Goal: Will not experience complications related to urinary retention 04/22/2024 1818 by Anice Troy BIRCH, RN Outcome: Not Progressing 04/22/2024 1818 by Anice Troy BIRCH, RN Outcome: Progressing   Problem: Pain Managment: Goal: General experience of comfort will improve and/or be controlled 04/22/2024 1818 by Anice Troy BIRCH, RN Outcome: Not Progressing 04/22/2024 1818 by Anice Troy BIRCH, RN Outcome: Progressing   Problem: Safety: Goal: Ability to remain free from injury will improve 04/22/2024 1818 by Anice Troy BIRCH, RN Outcome: Not Progressing 04/22/2024 1818 by Anice Troy BIRCH, RN Outcome: Progressing   Problem: Skin Integrity: Goal: Risk for impaired skin integrity will decrease 04/22/2024 1818 by Anice Troy BIRCH, RN Outcome: Not Progressing 04/22/2024 1818 by Anice Troy BIRCH, RN Outcome: Progressing

## 2024-04-22 NOTE — Plan of Care (Signed)
   Problem: Activity: Goal: Risk for activity intolerance will decrease Outcome: Progressing   Problem: Nutrition: Goal: Adequate nutrition will be maintained Outcome: Progressing   Problem: Pain Managment: Goal: General experience of comfort will improve and/or be controlled Outcome: Progressing   Problem: Safety: Goal: Ability to remain free from injury will improve Outcome: Progressing

## 2024-04-22 NOTE — Consult Note (Signed)
 [facility]-General Surgery  SURGICAL CONSULTATION NOTE    HISTORY OF PRESENT ILLNESS (HPI):  63 y.o. male presented to [facility] ED April 14, 2024 due to no bowel movement in the last 6 days associated with some nausea. Initially had 1 episode of vomiting. Has a chronic history of constipation and esophagitis.Take suppositories and Linzess . CT of abdomen and pelvis showed extensive constipation, negative for bowel obstruction or other acute findings.  He was admitted for AKI and a nongap metabolic acidosis.   Patient is now experiencing generalized abdominal pain associated with nausea and vomiting.  States he has been vomiting all day.  Has not had a bowel movement in 3-4 days but is passing gas.  No known alleviating or aggravating factors.  States he has not had anything to eat since yesterday afternoon.  No prior abdominal surgeries.  Patient was presented with similar symptoms in March 2021 and was treated for diabetic gastroparesis with IV Reglan  and pain management.   This morning patient was afebrile, hypertensive with a BP of 187/88 and HR of 84.  Labs shows leukocytosis with a WBC of 16.1 and hemoglobin of 11.2.  No electrolyte abnormalities shown on BMP.  Elevated creatinine of 3.89.  Abdominal x-ray shows gaseous distention of stomach and increased amount of gas throughout small bowel and colon.  No free air noted.   Surgery is consulted by D. Rashid in this context for evaluation and management of possible gastric outlet obstruction vs ileus.   PAST MEDICAL HISTORY (PMH):  Past Medical History:  Diagnosis Date   Anxiety    Depression    Diabetes mellitus without complication (HCC)    Hepatitis C    Hyperlipidemia    IBS (irritable bowel syndrome)    Osteomyelitis (HCC)    Spinal stenosis      PAST SURGICAL HISTORY (PSH):  Past Surgical History:  Procedure Laterality Date   BACK SURGERY     bilateral amputation Bilateral    CENTRAL LINE INSERTION-TUNNELED N/A 02/02/2019    Procedure: CENTRAL LINE INSERTION-TUNNELED;  Surgeon: Marea Selinda RAMAN, MD;  Location: [facility] INVASIVE CV LAB;  Service: Cardiovascular;  Laterality: N/A;   COLONOSCOPY WITH PROPOFOL  N/A 01/12/2020   Procedure: COLONOSCOPY WITH PROPOFOL ;  Surgeon: Jinny Carmine, MD;  Location: [facility] ENDOSCOPY;  Service: Endoscopy;  Laterality: N/A;   ESOPHAGOGASTRODUODENOSCOPY (EGD) WITH PROPOFOL  N/A 12/07/2019   Procedure: ESOPHAGOGASTRODUODENOSCOPY (EGD) WITH PROPOFOL ;  Surgeon: Unk Corinn Skiff, MD;  Location: [facility] ENDOSCOPY;  Service: Gastroenterology;  Laterality: N/A;   IR CATHETER TUBE CHANGE  12/04/2019   SPINAL FUSION     THORACIC SPINE SURGERY  01/2019   extensive washout     MEDICATIONS:  Prior to Admission medications   Medication Sig Start Date End Date Taking? Authorizing Provider  acetaminophen  (TYLENOL ) 325 MG tablet Take 650 mg by mouth in the morning and at bedtime.   Yes [provider]  bisacodyl  (DULCOLAX) 10 MG suppository Place 10 mg rectally daily as needed for moderate constipation.   Yes [provider]  Doxepin  HCl 6 MG TABS Take 6 mg by mouth at bedtime.   Yes [provider]  furosemide  (LASIX ) 40 MG tablet Take 1 tablet (40 mg total) by mouth daily. 04/25/21  Yes Patel, Sona, MD  gabapentin  (NEURONTIN ) 300 MG capsule Take 300 mg by mouth at bedtime.   Yes [provider]  hydrocortisone  cream 1 % Apply 1 Application topically daily.   Yes [provider]  insulin  glargine (LANTUS ) 100 UNIT/ML injection  Inject 0.25 mLs (25 Units total) into the skin daily. Patient taking differently: Inject 40 Units into the skin 2 (two) times daily. 04/25/21  Yes Patel, Sona, MD  insulin  lispro (HUMALOG ) 100 UNIT/ML injection Inject 0-15 Units into the skin 4 (four) times daily -  before meals and at bedtime. <150= 0 units 151-200= 3 units 201-250= 6 units 251-300= 9 units 301-350= 12 units 351-400= 15 units >400 call MD Patient taking differently: Inject  0-15 Units into the skin 4 (four) times daily -  before meals and at bedtime. <150= 0 units 151-200= 3 units 201-250= 6 units 251-300= 9 units 301-350= 12 units 351-400= 15 units >400 call MD   Yes [provider]  lactulose  (CEPHULAC ) 20 g packet Take 20 g by mouth 2 (two) times daily.   Yes [provider]  levofloxacin (LEVAQUIN) 750 MG tablet Take 750 mg by mouth daily.   Yes [provider]  levothyroxine  (SYNTHROID ) 175 MCG tablet Take 175 mcg by mouth at bedtime.   Yes [provider]  linaclotide  (LINZESS ) 145 MCG CAPS capsule Take 1 capsule (145 mcg total) by mouth daily before breakfast. 10/25/19  Yes Danford, Lonni SQUIBB, MD  Melatonin 5 MG TABS Take 10 mg by mouth at bedtime.   Yes [provider]  ondansetron  (ZOFRAN ) 4 MG tablet Take 4 mg by mouth daily.   Yes [provider]  oxyCODONE  (OXY IR/ROXICODONE ) 5 MG immediate release tablet Take 2 tablets (10 mg total) by mouth every 8 (eight) hours. Patient taking differently: Take 10 mg by mouth in the morning, at noon, and at bedtime. 04/25/21  Yes Patel, Sona, MD  polyethylene glycol (MIRALAX  / GLYCOLAX ) 17 g packet Take 17 g by mouth 2 (two) times daily. Patient taking differently: Take 17 g by mouth daily. 10/24/19  Yes Danford, Lonni SQUIBB, MD  senna-docusate (SENOKOT-S) 8.6-50 MG tablet Take 1 tablet by mouth 2 (two) times daily. 10/24/19  Yes Danford, Lonni SQUIBB, MD  sitaGLIPtin (JANUVIA) 100 MG tablet Take 100 mg by mouth daily.   Yes [provider]  sodium phosphate  Pediatric (FLEET) 3.5-9.5 GM/59ML enema Place 1 enema rectally every other day as needed for severe constipation.   Yes [provider]  amLODipine  (NORVASC ) 10 MG tablet Take 10 mg by mouth daily. Patient not taking: Reported on 04/14/2024    [provider]  metoprolol  succinate (TOPROL -XL) 25 MG 24 hr tablet Take 25 mg by mouth daily. Patient not taking: Reported on 04/14/2024     [provider]  pregabalin  (LYRICA ) 75 MG capsule Take 75 mg by mouth 2 (two) times daily. Patient not taking: Reported on 04/17/2021  04/20/21  [provider]     ALLERGIES:  No Known Allergies   SOCIAL HISTORY:  Social History   Socioeconomic History   Marital status: Single    Spouse name: Not on file   Number of children: Not on file   Years of education: Not on file   Highest education level: Not on file  Occupational History   Not on file  Tobacco Use   Smoking status: Some Days    Current packs/day: 0.25    Types: Cigarettes   Smokeless tobacco: Never  Vaping Use   Vaping status: Never Used  Substance and Sexual Activity   Alcohol use: Not Currently   Drug use: Not Currently   Sexual activity: Not Currently  Other Topics Concern   Not on file  Social History Narrative  Resides at [employer]   Social Drivers of Health   Financial Resource Strain: Medium Risk (02/02/2019)   Overall Financial Resource Strain (CARDIA)    Difficulty of Paying Living Expenses: Somewhat hard  Food Insecurity: Food Insecurity Present (02/02/2019)   Hunger Vital Sign    Worried About Running Out of Food in the Last Year: Sometimes true    Ran Out of Food in the Last Year: Sometimes true  Transportation Needs: No Transportation Needs (02/02/2019)   PRAPARE - Administrator, Civil Service (Medical): No    Lack of Transportation (Non-Medical): No  Recent Concern: Transportation Needs - Unmet Transportation Needs (11/07/2018)   [facility] from [facility] - Transportation    Lack of Transportation (Medical): Yes    Lack of Transportation (Non-Medical): Yes  Physical Activity: Unknown (02/02/2019)   Exercise Vital Sign    Days of Exercise per Week: 0 days    Minutes of Exercise per Session: Not on file  Stress: Stress Concern Present (02/02/2019)   [employer] of Occupational Health - Occupational Stress Questionnaire    Feeling  of Stress : To some extent  Social Connections: Somewhat Isolated (02/02/2019)   Social Connection and Isolation Panel    Frequency of Communication with Friends and Family: More than three times a week    Frequency of Social Gatherings with Friends and Family: More than three times a week    Attends Religious Services: More than 4 times per year    Active Member of [employer] or Organizations: Not on file    Attends Banker Meetings: Never    Marital Status: Never married  Intimate Partner Violence: Unknown (02/02/2019)   Humiliation, Afraid, Rape, and Kick questionnaire    Fear of Current or Ex-Partner: Patient declined    Emotionally Abused: Patient declined    Physically Abused: Patient declined    Sexually Abused: Patient declined     FAMILY HISTORY:  History reviewed. No pertinent family history.    REVIEW OF SYSTEMS:  Review of Systems  Constitutional:  Negative for chills and fever.  Respiratory:  Negative for shortness of breath and wheezing.   Cardiovascular:  Negative for chest pain and palpitations.  Gastrointestinal:  Positive for abdominal pain, constipation, nausea and vomiting.    VITAL SIGNS:  Temp:  [97.5 F (36.4 C)-98.1 F (36.7 C)] 98.1 F (36.7 C) (07/30 1158) Pulse Rate:  [36-84] 84 (07/30 1158) Resp:  [18] 18 (07/29 1924) BP: (145-190)/(67-90) 187/88 (07/30 1158) SpO2:  [92 %-96 %] 94 % (07/30 1158)     Height: 5' (152.4 cm) Weight: 117 kg BMI (Calculated): 50.38   INTAKE/OUTPUT:  07/29 0701 - 07/30 0700 In: 630 [P.O.:480; IV Piggyback:150] Out: 2950 [Urine:2950]  PHYSICAL EXAM:  Physical Exam Constitutional:      Appearance: Normal appearance.  HENT:     Head: Normocephalic and atraumatic.  Cardiovascular:     Rate and Rhythm: Normal rate and regular rhythm.  Pulmonary:     Effort: Pulmonary effort is normal.     Breath sounds: Normal breath sounds.  Abdominal:     General: There is distension.     Palpations: Abdomen is soft.      Tenderness: There is abdominal tenderness. There is no guarding.  Neurological:     Mental Status: He is alert.     Labs:     Latest Ref Rng & Units 04/22/2024    4:15 AM 04/21/2024    5:30 AM  04/20/2024    9:12 AM  CBC  WBC 4.0 - 10.5 K/uL 16.1  8.7  8.1   Hemoglobin 13.0 - 17.0 g/dL 88.7  9.9  9.8   Hematocrit 39.0 - 52.0 % 34.5  31.5  31.0   Platelets 150 - 400 K/uL 269  210  194       Latest Ref Rng & Units 04/22/2024    4:15 AM 04/21/2024    2:30 PM 04/21/2024    5:30 AM  CMP  Glucose 70 - 99 mg/dL 723  682  659   BUN 8 - 23 mg/dL 81  76  74   Creatinine 0.61 - 1.24 mg/dL 6.10  6.00  6.03   Sodium 135 - 145 mmol/L 139  134  138   Potassium 3.5 - 5.1 mmol/L 4.7  5.2  5.6   Chloride 98 - 111 mmol/L 107  106  109   CO2 22 - 32 mmol/L 23  19  21    Calcium  8.9 - 10.3 mg/dL 7.8  7.3  7.6   Total Protein 6.5 - 8.1 g/dL   6.7   Total Bilirubin 0.0 - 1.2 mg/dL   0.3   Alkaline Phos 38 - 126 U/L   51   AST 15 - 41 U/L   17   ALT 0 - 44 U/L   14     Imaging studies:  CLINICAL DATA:  Constipation. Abdominal pain. Dilated bowel loops and abnormal chest radiograph.   EXAM: 04/14/24 CT CHEST, ABDOMEN AND PELVIS WITHOUT CONTRAST   TECHNIQUE: Multidetector CT imaging of the chest, abdomen and pelvis was performed following the standard protocol without IV contrast.   RADIATION DOSE REDUCTION: This exam was performed according to the departmental dose-optimization program which includes automated exposure control, adjustment of the mA and/or kV according to patient size and/or use of iterative reconstruction technique.   COMPARISON:  AP CT on 04/17/2021   FINDINGS: CT CHEST FINDINGS   Cardiovascular: No acute findings.   Mediastinum/Lymph Nodes: No masses or pathologically enlarged lymph nodes identified on this unenhanced exam.   Lungs/Pleura: Small bilateral pleural effusions of pleural thickening. Bilateral pleural-parenchymal scarring greatest in lower lobes,  with areas of rounded atelectasis in both lower lobes.   Musculoskeletal: No suspicious bone lesions identified. Thoracic spine fusion hardware noted.   CT ABDOMEN AND PELVIS FINDINGS   Hepatobiliary: No masses visualized on this unenhanced exam. Mildly distended gallbladder, without other signs of cholecystitis or biliary ductal dilatation.   Pancreas: No mass or inflammatory changes identified on this unenhanced exam.   Spleen:  Within normal limits in size.   Adrenals/Urinary Tract: No evidence of urolithiasis or hydronephrosis. Suprapubic bladder catheter in appropriate position.   Stomach/Bowel: No evidence of obstruction, inflammatory process, or abnormal fluid collections. Moderate colonic stool burden noted. Small umbilical hernia is seen which contains only fat.   Vascular/Lymphatic: No pathologically enlarged lymph nodes identified. No abdominal aortic aneurysm.   Reproductive:  No masses or other significant abnormality.   Other:  None.   Musculoskeletal:  No suspicious bone lesions identified.   IMPRESSION: No evidence of bowel obstruction or other acute findings within the abdomen or pelvis.   Small bilateral pleural effusions with pleural thickening.   Bilateral pleural-parenchymal scarring, with areas of rounded atelectasis in both lower lobes.   Small umbilical hernia, which contains only fat.     Electronically Signed   By: Norleen DELENA Kil M.D.   On: 04/14/2024 09:11   CLINICAL DATA:  13894 Emesis [id number] Constipation [id number].   EXAM: 04/22/24 ABDOMEN - 2 VIEW   COMPARISON:  12/11/2019.   FINDINGS: There is marked gaseous distention of the stomach. Correlate for gastric outlet obstruction.   There is also increased amount of gas throughout the small bowel and colon, likely manifestation of adynamic ileus. Consider radiographic follow up if clinically indicated.   No evidence of pneumoperitoneum, within the limitations of a  supine film.   No acute osseous abnormalities.   There is small to moderate left pleural effusion with associated compressive atelectatic changes in the left lung. There is also small right pleural effusion. The soft tissues are otherwise within normal limits.   Surgical changes, devices, tubes and lines: Thoracic spinal fixation hardware noted.   IMPRESSION: 1. Marked gaseous distention of the stomach. Correlate clinically for gastric outlet obstruction. 2. Increased amount of gas throughout the small bowel and colon, likely manifestation of adynamic ileus. Consider radiographic follow up, if clinically indicated. 3. Small to moderate left pleural effusion with associated compressive atelectatic changes in the left lung. Small right pleural effusion.     Electronically Signed   By: Ree Molt M.D.   On: 04/22/2024 08:21  Assessment/Plan: 63 y.o. male with  gastric outlet obstruction vs ileus vs gastroparesis, complicated by pertinent comorbidities including diabetes mellitus, bilateral BLA, history of diabetic gastroparesis, hepatitis C, chronic suprapubic catheter, recurrent fecal impaction, hypertension, chronic pain syndrome, and tobacco user.   Recommended small bowel study with NGT since patient reports frequent emesis episodes. However, patient is refusing NGT.  Started Gas-X to help with distention. Continue symptomatic treatment with pain medication and antiemetics. Surgery will be available once patient is compliant with our recommendations. Continue to follow hospitalist recommendations.   Thank you for the opportunity to participate in this patient's care.   -- Gilmer Cea PA-C

## 2024-04-22 NOTE — Progress Notes (Signed)
 Transport came to pick up patient for abdomen x-ray, patient refused at this time and stated he will go down later when he is not throwing up.

## 2024-04-22 NOTE — Progress Notes (Addendum)
 PROGRESS NOTE    Eugene [name]  FMW:[id number] DOB: [birth year] DOA: 04/14/2024 PCP: [facility]   Brief Narrative:   63 y.o. male with medical history significant of DM2, bilateral BKA, hep C, chronic suprapubic cath, recurrent fecal impaction who was sent from SNF, found to have AKI.    Assessment & Plan:  Principal Problem:   AKI (acute kidney injury) (HCC) Active Problems:   Catheter-associated urinary tract infection (HCC)   Hyperkalemia   Suprapubic catheter (HCC)   Acute kidney injury (HCC)   Acid-base disorder, mixed   Acute hypoxic respiratory failure (HCC)    Adynamic ileus, possible gastric out let obstruction: Xray abdomen done on 7/30 showed Marked gaseous distention of the stomach. Correlate clinically for gastric outlet obstruction and Increased amount of gas throughout the small bowel and colon,likely manifestation of adynamic ileus. General surgery consulted Will keep NPO  Acute respiratory failure with hypoxia: - Acute episode of hypoxia 7/26.  Initially requiring 8 L nasal cannula, now down to 4-5 L.  - V/Q ruling out PE so discontinued empiric heparin  drip.  Chest x-ray personally reviewed does show bilateral effusions and congestion.  ECHO normal.   Continue IV Lasix  40 mg every 12 hours along with scheduled albumin .   I personally reviewed the chest CT which showed small bilateral pleural effusions.  Monitor respiratory function closely.  Continue to wean O2 as tolerated.   Likely acute exacerbation of HFpEF,POA: NYHA III - Normal EF on ECHO.  Chest x-ray personally reviewed noting congestions and effusions.    Diuretics as above.  Monitor urine output closely.   Acute kidney injury likely superimposed on CKD 4: - Creatinine 4.23 on presentation. Last creatinine on record 0.84 in July 2022.  Likely exacerbated by diuresis.  Renal ultrasound normal.  Continue IV Lasix  40 mg every 12 hours along with scheduled albumin .  Monitor I's and O's.   F/u  BMP in am F/u PTH and vitamin D  levels   Mild hyperkalemia - Resolved   Chronic constipation - prn laxatives   Possible acute urinary tract infection with chronic suprapubic catheter (POA) - Patient with noted dysuria.  UA noting leukocytes, WBCs, bacteria.  Urine culture noting Proteus as well as small colonies of staph aureus.  Suprapubic catheter replaced 7/26.  Transition from ceftriaxone  to Keflex    Type 2 diabetes mellitus - A1c 6.0 suggesting excellent control prior to admission.  Not well-controlled with inpatient, likely exacerbated by prednisone  use.  Initiated insulin  glargine 10 units.  Insulin  sliding scale board.  Increase meal coverage to 3 units.   Chronic opioid use - Likely exacerbating AKI and constipation.  Continue home oxycodone  5 mg 3 times daily as needed.  Disposition: SNF  DVT prophylaxis: enoxaparin  (LOVENOX ) injection 30 mg Start: 04/21/24 1100     Code Status: Do not attempt resuscitation (DNR) PRE-ARREST INTERVENTIONS DESIRED Family Communication:   Status is: Inpatient Remains inpatient appropriate because: ARF    Subjective:  He was complaining of abdominal pain. He said pain started yesterday. He is also constipated. We talked about getting abdominal Xray to rule out bowel obstruction.  Examination:  General exam: Appears to be in moderate distress, nasal oxygen cannula in place Respiratory system: Clear to auscultation. Respiratory effort normal. Cardiovascular system: S1 & S2 heard, RRR. No JVD, murmurs, rubs, gallops or clicks. No pedal edema. Gastrointestinal system: Abdomen is distended and slightly tender diffusely Central nervous system: Alert and oriented. No focal neurological deficits. Extremities: Symmetric 5 x 5 power. Skin:  No rashes, lesions or ulcers Psychiatry: Judgement and insight appear normal. Mood & affect appropriate.      Diet Orders (From admission, onward)     Start     Ordered   04/14/24 1248  Diet Carb  Modified Fluid consistency: Thin  Diet effective now       Question Answer Comment  Diet-HS Snack? Nothing   Calorie Level Medium 1600-2000   Fluid consistency: Thin      04/14/24 1247            Objective: Vitals:   04/21/24 2001 04/21/24 2100 04/22/24 0812 04/22/24 1158  BP:   (!) 190/90 (!) 187/88  Pulse:   84 84  Resp:      Temp:   98.1 F (36.7 C) 98.1 F (36.7 C)  TempSrc:      SpO2: 92% 94% 96% 94%  Weight:      Height:        Intake/Output Summary (Last 24 hours) at 04/22/2024 1348 Last data filed at 04/22/2024 0625 Gross per 24 hour  Intake 150 ml  Output 2150 ml  Net -2000 ml   Filed Weights   04/14/24 0638 04/14/24 2358  Weight: 117.1 kg 117 kg    Scheduled Meds:  amLODipine   10 mg Oral Daily   budesonide  (PULMICORT ) nebulizer solution  0.25 mg Nebulization BID   cephALEXin   500 mg Oral Q8H   Chlorhexidine  Gluconate Cloth  6 each Topical Q0600   enoxaparin  (LOVENOX ) injection  30 mg Subcutaneous Q24H   furosemide   40 mg Intravenous Q12H   gabapentin   300 mg Oral QHS   insulin  aspart  0-5 Units Subcutaneous QHS   insulin  aspart  0-9 Units Subcutaneous TID WC   insulin  aspart  3 Units Subcutaneous TID WC   insulin  glargine-yfgn  10 Units Subcutaneous Q24H   levothyroxine   175 mcg Oral Q0600   linaclotide   145 mcg Oral QAC breakfast   melatonin  10 mg Oral QHS   metoprolol  succinate  25 mg Oral Daily   nicotine   21 mg Transdermal Daily   polyethylene glycol  17 g Oral Daily   predniSONE   60 mg Oral Q breakfast   senna-docusate  1 tablet Oral BID   sodium bicarbonate   650 mg Oral TID   Continuous Infusions:  albumin  human 12.5 g (04/22/24 0908)   promethazine  (PHENERGAN ) injection (IM or IVPB) 12.5 mg (04/22/24 0509)    Nutritional status     Body mass index is 50.38 kg/m.  Data Reviewed:   CBC: Recent Labs  Lab 04/18/24 0511 04/19/24 0606 04/20/24 0912 04/21/24 0530 04/22/24 0415  WBC 5.4 6.8 8.1 8.7 16.1*  HGB 9.6* 9.9* 9.8*  9.9* 11.2*  HCT 30.4* 31.6* 31.0* 31.5* 34.5*  MCV 93.5 94.9 93.1 92.6 90.8  PLT 169 176 194 210 269   Basic Metabolic Panel: Recent Labs  Lab 04/16/24 0357 04/17/24 0442 04/18/24 0511 04/19/24 0606 04/20/24 0456 04/20/24 0912 04/20/24 1809 04/21/24 0530 04/21/24 1430 04/22/24 0415  NA 140 140 140 141 138 143 137 138 134* 139  K 5.4* 5.0 4.7 4.9 6.2* 5.8* 6.4* 5.6* 5.2* 4.7  CL 112* 111 111 113* 111 112* 111 109 106 107  CO2 20* 24 21* 22 16* 20* 17* 21* 19* 23  GLUCOSE 214* 213* 157* 189* 262* 229* 310* 340* 317* 276*  BUN 52* 51* 45* 46* 65* 64* 66* 74* 76* 81*  CREATININE 3.82* 3.69* 3.59* 3.64* 4.05* 4.12* 3.93* 3.96* 3.99* 3.89*  CALCIUM  7.5* 7.2* 7.0* 7.2* 7.5* 7.5* 7.5* 7.6* 7.3* 7.8*  MG 1.8 1.9 1.7 1.8 2.0  --   --  1.8  --  1.9  PHOS 4.8* 4.5  --   --   --   --   --  5.9*  --   --    GFR: Estimated Creatinine Clearance: 21.7 mL/min (A) (by C-G formula based on SCr of 3.89 mg/dL (H)). Liver Function Tests: Recent Labs  Lab 04/21/24 0530  AST 17  ALT 14  ALKPHOS 51  BILITOT 0.3  PROT 6.7  ALBUMIN  2.1*   No results for input(s): LIPASE, AMYLASE in the last 168 hours. No results for input(s): AMMONIA in the last 168 hours. Coagulation Profile: Recent Labs  Lab 04/20/24 0912  INR 1.1   Cardiac Enzymes: No results for input(s): CKTOTAL, CKMB, CKMBINDEX, TROPONINI in the last 168 hours. BNP (last 3 results) No results for input(s): PROBNP in the last 8760 hours. HbA1C: No results for input(s): HGBA1C in the last 72 hours. CBG: Recent Labs  Lab 04/21/24 1138 04/21/24 1621 04/21/24 2018 04/22/24 0814 04/22/24 1159  GLUCAP 294* 306* 271* 286* 279*   Lipid Profile: No results for input(s): CHOL, HDL, LDLCALC, TRIG, CHOLHDL, LDLDIRECT in the last 72 hours. Thyroid Function Tests: No results for input(s): TSH, T4TOTAL, FREET4, T3FREE, THYROIDAB in the last 72 hours. Anemia Panel: No results for input(s):  VITAMINB12, FOLATE, FERRITIN, TIBC, IRON , RETICCTPCT in the last 72 hours. Sepsis Labs: No results for input(s): PROCALCITON, LATICACIDVEN in the last 168 hours.  Recent Results (from the past 240 hours)  Urine Culture     Status: Abnormal   Collection Time: 04/14/24  6:43 AM   Specimen: Urine, Random  Result Value Ref Range Status   Specimen Description   Final    URINE, RANDOM Performed at [facility], [address] [place], [place] [address]    Special Requests   Final    NONE Reflexed from [id number] Performed at [facility], [address] [place], [place] [address]    Culture (A)  Final    >=100,000 COLONIES/mL PROTEUS MIRABILIS 10,000 COLONIES/mL STAPHYLOCOCCUS AUREUS    Report Status 04/17/2024 FINAL  Final   Organism ID, Bacteria PROTEUS MIRABILIS (A)  Final   Organism ID, Bacteria STAPHYLOCOCCUS AUREUS (A)  Final      Susceptibility   Proteus mirabilis - MIC*    AMPICILLIN <=2 SENSITIVE Sensitive     CEFAZOLIN  <=4 SENSITIVE Sensitive     CEFEPIME  <=0.12 SENSITIVE Sensitive     CEFTRIAXONE  <=0.25 SENSITIVE Sensitive     CIPROFLOXACIN  >=4 RESISTANT Resistant     GENTAMICIN <=1 SENSITIVE Sensitive     IMIPENEM 2 SENSITIVE Sensitive     NITROFURANTOIN RESISTANT Resistant     TRIMETH /SULFA  <=20 SENSITIVE Sensitive     AMPICILLIN/SULBACTAM <=2 SENSITIVE Sensitive     PIP/TAZO <=4 SENSITIVE Sensitive ug/mL    * >=100,000 COLONIES/mL PROTEUS MIRABILIS   Staphylococcus aureus - MIC*    CIPROFLOXACIN  >=8 RESISTANT Resistant     GENTAMICIN <=0.5 SENSITIVE Sensitive     NITROFURANTOIN <=16 SENSITIVE Sensitive     OXACILLIN <=0.25 SENSITIVE Sensitive     TETRACYCLINE <=1 SENSITIVE Sensitive     VANCOMYCIN  1 SENSITIVE Sensitive     TRIMETH /SULFA  <=10 SENSITIVE Sensitive     RIFAMPIN  <=0.5 SENSITIVE Sensitive     Inducible Clindamycin NEGATIVE Sensitive     LINEZOLID  2 SENSITIVE Sensitive     * 10,000 COLONIES/mL STAPHYLOCOCCUS  AUREUS          Radiology Studies: DG Abd 2 Views Result Date: 04/22/2024 CLINICAL DATA:  86105 Emesis [id number] Constipation [phone]. EXAM: ABDOMEN - 2 VIEW COMPARISON:  12/11/2019. FINDINGS: There is marked gaseous distention of the stomach. Correlate for gastric outlet obstruction. There is also increased amount of gas throughout the small bowel and colon, likely manifestation of adynamic ileus. Consider radiographic follow up if clinically indicated. No evidence of pneumoperitoneum, within the limitations of a supine film. No acute osseous abnormalities. There is small to moderate left pleural effusion with associated compressive atelectatic changes in the left lung. There is also small right pleural effusion. The soft tissues are otherwise within normal limits. Surgical changes, devices, tubes and lines: Thoracic spinal fixation hardware noted. IMPRESSION: 1. Marked gaseous distention of the stomach. Correlate clinically for gastric outlet obstruction. 2. Increased amount of gas throughout the small bowel and colon, likely manifestation of adynamic ileus. Consider radiographic follow up, if clinically indicated. 3. Small to moderate left pleural effusion with associated compressive atelectatic changes in the left lung. Small right pleural effusion. Electronically Signed   By: Ree Molt M.D.   On: 04/22/2024 08:21   CT CHEST WO CONTRAST Result Date: 04/21/2024 CLINICAL DATA:  Respiratory illness. EXAM: CT CHEST WITHOUT CONTRAST TECHNIQUE: Multidetector CT imaging of the chest was performed following the standard protocol without IV contrast. RADIATION DOSE REDUCTION: This exam was performed according to the departmental dose-optimization program which includes automated exposure control, adjustment of the mA and/or kV according to patient size and/or use of iterative reconstruction technique. COMPARISON:  Chest CT dated 04/14/2024. FINDINGS: Evaluation of this exam is limited in the absence of  intravenous contrast and streak artifact caused by spinal hardware. Cardiovascular: There is no cardiomegaly or pericardial effusion. There is coronary vascular calcification. The thoracic aorta and central pulmonary arteries are grossly unremarkable. Mediastinum/Nodes: No obvious hilar or mediastinal adenopathy. Evaluation however is limited in the absence of intravenous contrast as well as consolidative changes the lungs. The esophagus is grossly unremarkable. No mediastinal fluid collection Lungs/Pleura: Similar appearance of small bilateral pleural effusions with pleural thickening. Bilateral lower lobe and lingula consolidation similar or slightly progressed since the prior CT. Findings may represent atelectasis but pneumonia is not excluded. Faint ground-glass density in the right lung may represent atelectasis or pneumonia. No pneumothorax. Linear secretions noted in the trachea. The central airways are patent. Upper Abdomen: No acute abnormality. Musculoskeletal: Osteopenia with degenerative changes of the spine. Posterior fusion hardware. No acute osseous pathology. IMPRESSION: 1. Similar appearance of small bilateral pleural effusions with pleural thickening. 2. Bilateral lower lobe and lingula consolidation similar or slightly progressed since the prior CT. Findings may represent atelectasis but pneumonia is not excluded. Electronically Signed   By: Vanetta Chou M.D.   On: 04/21/2024 16:48   US  RENAL Result Date: 04/20/2024 CLINICAL DATA:  Acute kidney injury EXAM: RENAL / URINARY TRACT ULTRASOUND COMPLETE COMPARISON:  04/14/2024 CT scan FINDINGS: Right Kidney: Renal measurements: 11.7 by 6.5 by 5.8 cm = volume: 228 mL. Echogenicity within normal limits. No mass or hydronephrosis visualized. Left Kidney: Renal measurements: 13.0 by 6.5 by 5.5 cm = volume: 240 mL. Echogenicity within normal limits. No mass or hydronephrosis visualized. Bladder: Empty, suprapubic catheter in place. Other: None.  IMPRESSION: 1. Normal sonographic appearance of the kidneys. 2. Empty bladder, suprapubic catheter in place. Electronically Signed   By: Ryan Salvage M.D.   On: 04/20/2024 18:30  LOS: 7 days   Time spent= 42 mins    Deliliah Room, MD [facility]  If 7PM-7AM, please contact night-coverage  04/22/2024, 1:48 PM

## 2024-04-23 ENCOUNTER — Inpatient Hospital Stay

## 2024-04-23 DIAGNOSIS — K567 Ileus, unspecified: Secondary | ICD-10-CM | POA: Insufficient documentation

## 2024-04-23 DIAGNOSIS — N179 Acute kidney failure, unspecified: Secondary | ICD-10-CM | POA: Diagnosis not present

## 2024-04-23 DIAGNOSIS — J9601 Acute respiratory failure with hypoxia: Secondary | ICD-10-CM | POA: Diagnosis not present

## 2024-04-23 LAB — CBC WITH DIFFERENTIAL/PLATELET
Abs Immature Granulocytes: 0.12 K/uL — ABNORMAL HIGH (ref 0.00–0.07)
Basophils Absolute: 0 K/uL (ref 0.0–0.1)
Basophils Relative: 0 %
Eosinophils Absolute: 0 K/uL (ref 0.0–0.5)
Eosinophils Relative: 0 %
HCT: 36.8 % — ABNORMAL LOW (ref 39.0–52.0)
Hemoglobin: 11.5 g/dL — ABNORMAL LOW (ref 13.0–17.0)
Immature Granulocytes: 1 %
Lymphocytes Relative: 10 %
Lymphs Abs: 1.2 K/uL (ref 0.7–4.0)
MCH: 29 pg (ref 26.0–34.0)
MCHC: 31.3 g/dL (ref 30.0–36.0)
MCV: 92.9 fL (ref 80.0–100.0)
Monocytes Absolute: 0.6 K/uL (ref 0.1–1.0)
Monocytes Relative: 5 %
Neutro Abs: 9.2 K/uL — ABNORMAL HIGH (ref 1.7–7.7)
Neutrophils Relative %: 84 %
Platelets: 225 K/uL (ref 150–400)
RBC: 3.96 MIL/uL — ABNORMAL LOW (ref 4.22–5.81)
RDW: 13.7 % (ref 11.5–15.5)
WBC: 11.1 K/uL — ABNORMAL HIGH (ref 4.0–10.5)
nRBC: 0 % (ref 0.0–0.2)

## 2024-04-23 LAB — GLUCOSE, CAPILLARY
Glucose-Capillary: 199 mg/dL — ABNORMAL HIGH (ref 70–99)
Glucose-Capillary: 208 mg/dL — ABNORMAL HIGH (ref 70–99)
Glucose-Capillary: 182 mg/dL — ABNORMAL HIGH (ref 70–99)
Glucose-Capillary: 186 mg/dL — ABNORMAL HIGH (ref 70–99)

## 2024-04-23 LAB — VITAMIN D 25 HYDROXY (VIT D DEFICIENCY, FRACTURES): Vit D, 25-Hydroxy: 7.92 ng/mL — ABNORMAL LOW (ref 30–100)

## 2024-04-23 LAB — MAGNESIUM: Magnesium: 1.7 mg/dL (ref 1.7–2.4)

## 2024-04-23 LAB — BASIC METABOLIC PANEL WITH GFR
Anion gap: 11 (ref 5–15)
BUN: 76 mg/dL — ABNORMAL HIGH (ref 8–23)
CO2: 23 mmol/L (ref 22–32)
Calcium: 7.8 mg/dL — ABNORMAL LOW (ref 8.9–10.3)
Chloride: 111 mmol/L (ref 98–111)
Creatinine, Ser: 3.74 mg/dL — ABNORMAL HIGH (ref 0.61–1.24)
GFR, Estimated: 18 mL/min — ABNORMAL LOW (ref >60)
Glucose, Bld: 219 mg/dL — ABNORMAL HIGH (ref 70–99)
Potassium: 4.3 mmol/L (ref 3.5–5.1)
Sodium: 145 mmol/L (ref 135–145)

## 2024-04-23 LAB — PHOSPHORUS: Phosphorus: 5.3 mg/dL — ABNORMAL HIGH (ref 2.5–4.6)

## 2024-04-23 MED ORDER — TRAZODONE HCL 100 MG PO TABS
100.0000 mg | ORAL_TABLET | Freq: Every evening | ORAL | Status: DC | PRN
  Administered 2024-04-25 – 2024-04-30 (×6): 100 mg via NASOGASTRIC
  Filled 2024-04-23 (×5): qty 1

## 2024-04-23 MED ORDER — AMLODIPINE BESYLATE 10 MG PO TABS
10.0000 mg | ORAL_TABLET | Freq: Every day | ORAL | Status: DC
  Administered 2024-04-24 – 2024-04-30 (×5): 10 mg via NASOGASTRIC
  Filled 2024-04-23 (×8): qty 1

## 2024-04-23 MED ORDER — OXYCODONE HCL 5 MG PO TABS
10.0000 mg | ORAL_TABLET | Freq: Three times a day (TID) | ORAL | Status: DC | PRN
  Administered 2024-04-23 – 2024-05-01 (×7): 10 mg via NASOGASTRIC
  Filled 2024-04-23 (×8): qty 2

## 2024-04-23 MED ORDER — SIMETHICONE 80 MG PO CHEW
80.0000 mg | CHEWABLE_TABLET | Freq: Four times a day (QID) | ORAL | Status: DC
  Administered 2024-04-23 – 2024-04-30 (×26): 80 mg via NASOGASTRIC
  Filled 2024-04-23 (×29): qty 1

## 2024-04-23 MED ORDER — LINACLOTIDE 145 MCG PO CAPS
145.0000 ug | ORAL_CAPSULE | Freq: Every day | ORAL | Status: DC
  Administered 2024-04-24 – 2024-04-30 (×5): 145 ug
  Filled 2024-04-23 (×8): qty 1

## 2024-04-23 MED ORDER — LEVOTHYROXINE SODIUM 50 MCG PO TABS
175.0000 ug | ORAL_TABLET | Freq: Every day | ORAL | Status: DC
  Administered 2024-04-24 – 2024-04-28 (×5): 175 ug via NASOGASTRIC
  Filled 2024-04-23 (×2): qty 2
  Filled 2024-04-23 (×5): qty 1

## 2024-04-23 MED ORDER — SODIUM BICARBONATE 650 MG PO TABS
650.0000 mg | ORAL_TABLET | Freq: Three times a day (TID) | ORAL | Status: DC
  Administered 2024-04-23 – 2024-04-25 (×6): 650 mg via NASOGASTRIC
  Filled 2024-04-23 (×6): qty 1

## 2024-04-23 MED ORDER — POLYETHYLENE GLYCOL 3350 17 G PO PACK
17.0000 g | PACK | Freq: Every day | ORAL | Status: DC
  Administered 2024-04-24 – 2024-04-30 (×6): 17 g via NASOGASTRIC
  Filled 2024-04-23 (×8): qty 1

## 2024-04-23 MED ORDER — SENNOSIDES-DOCUSATE SODIUM 8.6-50 MG PO TABS
1.0000 | ORAL_TABLET | Freq: Two times a day (BID) | ORAL | Status: DC
  Administered 2024-04-23 – 2024-04-30 (×11): 1 via NASOGASTRIC
  Filled 2024-04-23 (×14): qty 1

## 2024-04-23 MED ORDER — METOPROLOL TARTRATE 25 MG/10 ML ORAL SUSPENSION
12.5000 mg | Freq: Two times a day (BID) | ORAL | Status: DC
  Administered 2024-04-24 – 2024-04-29 (×10): 12.5 mg
  Filled 2024-04-23 (×16): qty 10

## 2024-04-23 MED ORDER — MELATONIN 5 MG PO TABS
10.0000 mg | ORAL_TABLET | Freq: Every day | ORAL | Status: DC
  Administered 2024-04-23 – 2024-04-30 (×8): 10 mg
  Filled 2024-04-23 (×8): qty 2

## 2024-04-23 MED ORDER — GABAPENTIN 300 MG PO CAPS
300.0000 mg | ORAL_CAPSULE | Freq: Every day | ORAL | Status: DC
  Administered 2024-04-23 – 2024-04-30 (×8): 300 mg
  Filled 2024-04-23 (×8): qty 1

## 2024-04-23 MED ORDER — METOPROLOL TARTRATE 25 MG PO TABS: 12.5000 mg | ORAL_TABLET | Freq: Two times a day (BID) | ORAL | Status: DC

## 2024-04-23 NOTE — Plan of Care (Signed)
   Problem: Education: Goal: Knowledge of General Education information will improve Description Including pain rating scale, medication(s)/side effects and non-pharmacologic comfort measures Outcome: Progressing

## 2024-04-23 NOTE — Progress Notes (Addendum)
 Progress Note    Eugene [name]  FMW:[id number] DOB: 01/28/1961  DOA: 04/14/2024 PCP: Care, [facility]      Brief Narrative:    Medical records reviewed and are as summarized below:  Eugene [name] is a 63 y.o. male with medical history significant for hepatitis C, osteomyelitis, spinal stenosis, IBS, diabetes mellitus, depression, anxiety, history of BKA, neurogenic bladder with chronic suprapubic catheter, recurrent fecal impaction, history of bowel obstruction requiring NG tube decompression, who was sent from SNF because of constipation.  Reportedly, he had not moved his bowels for about 6 days prior to admission.  He also had an episode of vomiting.  He was admitted to the hospital for constipation AKI.       Assessment/Plan:   Principal Problem:   AKI (acute kidney injury) (HCC) Active Problems:   Catheter-associated urinary tract infection (HCC)   Hyperkalemia   Suprapubic catheter (HCC)   Acute kidney injury (HCC)   Acid-base disorder, mixed   Acute hypoxic respiratory failure (HCC)   Ileus (HCC)    Body mass index is 50.38 kg/m.  (Morbid obesity)    AKI: Creatinine is 3.74.  It was 4.23 on admission.  Suspect underlying chronic kidney disease.  He has proteinuria as well. Last creatinine on record was 0.84 in July 2022. Consulted Dr. Marcelino, nephrologist.   Acute hypoxic respiratory failure: He is on 6 L/min oxygen via [place]. VQ scan was low probability for PE.    Adynamic ileus, gastroparesis, intractable nausea and vomiting, chronic constipation: NG tube was placed today for gastric decompression.  He is NPO.  Continue IV fluids because of n.p.o. status.  Hopefully this will help relieve his symptoms. He was evaluated by general surgeon on 04/22/2024.  No bowel obstruction and surgeon has signed off.   Probable acute complicated UTI in the setting of underlying chronic suprapubic catheter: He has been on IV antibiotics since 04/14/2024.       Probable bilateral lower lobe pneumonia on CT chest: Continue IV ceftriaxone  for now.   Type II DM: Continue glargine 10 units daily.NovoLog  as needed for hyperglycemia   Possible acute exacerbation of chronic HFpEF: Appears compensated for now. 2D echo was unremarkable.   Chronic opioid use: Continue oxycodone  as needed.   Leukocytosis: Improved.  Probably recent prednisone  on 04/20/2024 contributed leukocytosis.   Vitamin D  deficiency: Vitamin D  level was 7.92.  Start high-dose vitamin D  supplement when vomiting improves   Diet Order             Diet NPO time specified  Diet effective now                            Consultants: General Surgeon  Procedures: None    Medications:    [START ON 04/24/2024] amLODipine   10 mg Per NG tube Daily   budesonide  (PULMICORT ) nebulizer solution  0.25 mg Nebulization BID   Chlorhexidine  Gluconate Cloth  6 each Topical Q0600   diatrizoate  meglumine -sodium  90 mL Per NG tube Once   enoxaparin  (LOVENOX ) injection  30 mg Subcutaneous Q24H   gabapentin   300 mg Per Tube QHS   insulin  aspart  0-5 Units Subcutaneous QHS   insulin  aspart  0-9 Units Subcutaneous TID WC   insulin  glargine-yfgn  10 Units Subcutaneous Q24H   [START ON 04/24/2024] levothyroxine   175 mcg Per NG tube Q0600   [START ON 04/24/2024] linaclotide   145 mcg Per Tube QAC breakfast  melatonin  10 mg Per Tube QHS   [START ON 04/24/2024] metoprolol  tartrate  12.5 mg Per Tube BID   nicotine   21 mg Transdermal Daily   [START ON 04/24/2024] polyethylene glycol  17 g Per NG tube Daily   predniSONE   60 mg Oral Q breakfast   senna-docusate  1 tablet Per NG tube BID   simethicone   80 mg Per NG tube QID   sodium bicarbonate   650 mg Per NG tube TID   Continuous Infusions:  sodium chloride  75 mL/hr at 04/22/24 2139   cefTRIAXone  (ROCEPHIN )  IV 2 g (04/22/24 1706)   promethazine  (PHENERGAN ) injection (IM or IVPB) 12.5 mg (04/22/24 2044)     Anti-infectives  (From admission, onward)    Start     Dose/Rate Route Frequency Ordered Stop   04/22/24 1600  cefTRIAXone  (ROCEPHIN ) 2 g in sodium chloride  0.9 % 100 mL IVPB        2 g 200 mL/hr over 30 Minutes Intravenous Every 24 hours 04/22/24 1417     04/17/24 2200  cephALEXin  (KEFLEX ) capsule 500 mg  Status:  Discontinued        500 mg Oral Every 8 hours 04/17/24 1332 04/22/24 1417   04/16/24 1145  cefTRIAXone  (ROCEPHIN ) 1 g in sodium chloride  0.9 % 100 mL IVPB  Status:  Discontinued        1 g 200 mL/hr over 30 Minutes Intravenous Every 24 hours 04/16/24 1059 04/17/24 1332   04/14/24 0930  cefTRIAXone  (ROCEPHIN ) 2 g in sodium chloride  0.9 % 100 mL IVPB  Status:  Discontinued        2 g 200 mL/hr over 30 Minutes Intravenous  Once 04/14/24 9071 04/14/24 0946              Family Communication/Anticipated D/C date and plan/Code Status   DVT prophylaxis: enoxaparin  (LOVENOX ) injection 30 mg Start: 04/21/24 1100     Code Status: Do not attempt resuscitation (DNR) PRE-ARREST INTERVENTIONS DESIRED  Family Communication: None Disposition Plan: Plan to discharge to SNF   Status is: Inpatient Remains inpatient appropriate because: Intractable nausea and vomiting       Subjective:   Interval events noted.  He complains of nausea, vomiting and abdominal pain for the last 2 days.  He said he has not had any bowel movement for about 5 days now.   Objective:    Vitals:   04/23/24 0533 04/23/24 0823 04/23/24 1028 04/23/24 1147  BP: (!) 152/67 (!) 184/102 (!) 157/86 (!) 157/98  Pulse: 87 94  92  Resp: 18 20  18   Temp:  97.8 F (36.6 C)  98.5 F (36.9 C)  TempSrc:      SpO2: 95% 96%  95%  Weight:      Height:       No data found.   Intake/Output Summary (Last 24 hours) at 04/23/2024 1318 Last data filed at 04/23/2024 1000 Gross per 24 hour  Intake --  Output 2450 ml  Net -2450 ml   Filed Weights   04/14/24 9361 04/14/24 2358  Weight: 117.1 kg 117 kg    Exam:  GEN:  NAD SKIN: Warm and dry no EYES: No pallor or icterus ENT: MMM, NG tube in place CV: RRR PULM: CTA B ABD: soft, obese/distended, NT, +BS CNS: AAO x 3, non focal EXT: Bilateral BKA GU : Suprapubic catheter draining amber urine       Data Reviewed:   I have personally reviewed following labs and imaging studies:  Labs: Labs show the following:   Basic Metabolic Panel: Recent Labs  Lab 04/17/24 0442 04/18/24 0511 04/19/24 0606 04/20/24 0456 04/20/24 0912 04/20/24 1809 04/21/24 0530 04/21/24 1430 04/22/24 0415 04/23/24 0905  NA 140   < > 141 138   < > 137 138 134* 139 145  K 5.0   < > 4.9 6.2*   < > 6.4* 5.6* 5.2* 4.7 4.3  CL 111   < > 113* 111   < > 111 109 106 107 111  CO2 24   < > 22 16*   < > 17* 21* 19* 23 23  GLUCOSE 213*   < > 189* 262*   < > 310* 340* 317* 276* 219*  BUN 51*   < > 46* 65*   < > 66* 74* 76* 81* 76*  CREATININE 3.69*   < > 3.64* 4.05*   < > 3.93* 3.96* 3.99* 3.89* 3.74*  CALCIUM  7.2*   < > 7.2* 7.5*   < > 7.5* 7.6* 7.3* 7.8* 7.8*  MG 1.9   < > 1.8 2.0  --   --  1.8  --  1.9 1.7  PHOS 4.5  --   --   --   --   --  5.9*  --   --  5.3*   < > = values in this interval not displayed.   GFR Estimated Creatinine Clearance: 22.5 mL/min (A) (by C-G formula based on SCr of 3.74 mg/dL (H)). Liver Function Tests: Recent Labs  Lab 04/21/24 0530  AST 17  ALT 14  ALKPHOS 51  BILITOT 0.3  PROT 6.7  ALBUMIN  2.1*   No results for input(s): LIPASE, AMYLASE in the last 168 hours. No results for input(s): AMMONIA in the last 168 hours. Coagulation profile Recent Labs  Lab 04/20/24 0912  INR 1.1    CBC: Recent Labs  Lab 04/19/24 0606 04/20/24 0912 04/21/24 0530 04/22/24 0415 04/23/24 0905  WBC 6.8 8.1 8.7 16.1* 11.1*  NEUTROABS  --   --   --   --  9.2*  HGB 9.9* 9.8* 9.9* 11.2* 11.5*  HCT 31.6* 31.0* 31.5* 34.5* 36.8*  MCV 94.9 93.1 92.6 90.8 92.9  PLT 176 194 210 269 225   Cardiac Enzymes: No results for input(s): CKTOTAL,  CKMB, CKMBINDEX, TROPONINI in the last 168 hours. BNP (last 3 results) No results for input(s): PROBNP in the last 8760 hours. CBG: Recent Labs  Lab 04/22/24 1159 04/22/24 1655 04/22/24 1951 04/23/24 0822 04/23/24 1146  GLUCAP 279* 205* 175* 199* 208*   D-Dimer: No results for input(s): DDIMER in the last 72 hours. Hgb A1c: No results for input(s): HGBA1C in the last 72 hours. Lipid Profile: No results for input(s): CHOL, HDL, LDLCALC, TRIG, CHOLHDL, LDLDIRECT in the last 72 hours. Thyroid function studies: No results for input(s): TSH, T4TOTAL, T3FREE, THYROIDAB in the last 72 hours.  Invalid input(s): FREET3 Anemia work up: No results for input(s): VITAMINB12, FOLATE, FERRITIN, TIBC, IRON , RETICCTPCT in the last 72 hours. Sepsis Labs: Recent Labs  Lab 04/20/24 0912 04/21/24 0530 04/22/24 0415 04/23/24 0905  WBC 8.1 8.7 16.1* 11.1*    Microbiology Recent Results (from the past 240 hours)  Urine Culture     Status: Abnormal   Collection Time: 04/14/24  6:43 AM   Specimen: Urine, Random  Result Value Ref Range Status   Specimen Description   Final    URINE, RANDOM Performed at [facility], [address] [place], [place]  [address]    Special Requests   Final    NONE Reflexed from [id number] Performed at [facility], [address] [place], [place] [address]    Culture (A)  Final    >=100,000 COLONIES/mL PROTEUS MIRABILIS 10,000 COLONIES/mL STAPHYLOCOCCUS AUREUS    Report Status 04/17/2024 FINAL  Final   Organism ID, Bacteria PROTEUS MIRABILIS (A)  Final   Organism ID, Bacteria STAPHYLOCOCCUS AUREUS (A)  Final      Susceptibility   Proteus mirabilis - MIC*    AMPICILLIN <=2 SENSITIVE Sensitive     CEFAZOLIN  <=4 SENSITIVE Sensitive     CEFEPIME  <=0.12 SENSITIVE Sensitive     CEFTRIAXONE  <=0.25 SENSITIVE Sensitive     CIPROFLOXACIN  >=4 RESISTANT Resistant     GENTAMICIN <=1 SENSITIVE Sensitive      IMIPENEM 2 SENSITIVE Sensitive     NITROFURANTOIN RESISTANT Resistant     TRIMETH /SULFA  <=20 SENSITIVE Sensitive     AMPICILLIN/SULBACTAM <=2 SENSITIVE Sensitive     PIP/TAZO <=4 SENSITIVE Sensitive ug/mL    * >=100,000 COLONIES/mL PROTEUS MIRABILIS   Staphylococcus aureus - MIC*    CIPROFLOXACIN  >=8 RESISTANT Resistant     GENTAMICIN <=0.5 SENSITIVE Sensitive     NITROFURANTOIN <=16 SENSITIVE Sensitive     OXACILLIN <=0.25 SENSITIVE Sensitive     TETRACYCLINE <=1 SENSITIVE Sensitive     VANCOMYCIN  1 SENSITIVE Sensitive     TRIMETH /SULFA  <=10 SENSITIVE Sensitive     RIFAMPIN  <=0.5 SENSITIVE Sensitive     Inducible Clindamycin NEGATIVE Sensitive     LINEZOLID  2 SENSITIVE Sensitive     * 10,000 COLONIES/mL STAPHYLOCOCCUS AUREUS    Procedures and diagnostic studies:  DG Abd Portable 1V Result Date: 04/23/2024 CLINICAL DATA:  NG tube placement EXAM: PORTABLE ABDOMEN - 1 VIEW COMPARISON:  None Available. FINDINGS: NG tube with tip in the stomach. Side port is above the GE junction. The stomach is gas distended. IMPRESSION: 1. NG tube with tip in the stomach. Side port above the GE junction. Recommend advancement by 4 cm. 2. Gas in the stomach. Electronically Signed   By: Jackquline Boxer M.D.   On: 04/23/2024 10:14   DG Abd 2 Views Result Date: 04/22/2024 CLINICAL DATA:  86105 Emesis 86105 [id number] Constipation [id number]. EXAM: ABDOMEN - 2 VIEW COMPARISON:  12/11/2019. FINDINGS: There is marked gaseous distention of the stomach. Correlate for gastric outlet obstruction. There is also increased amount of gas throughout the small bowel and colon, likely manifestation of adynamic ileus. Consider radiographic follow up if clinically indicated. No evidence of pneumoperitoneum, within the limitations of a supine film. No acute osseous abnormalities. There is small to moderate left pleural effusion with associated compressive atelectatic changes in the left lung. There is also small right pleural  effusion. The soft tissues are otherwise within normal limits. Surgical changes, devices, tubes and lines: Thoracic spinal fixation hardware noted. IMPRESSION: 1. Marked gaseous distention of the stomach. Correlate clinically for gastric outlet obstruction. 2. Increased amount of gas throughout the small bowel and colon, likely manifestation of adynamic ileus. Consider radiographic follow up, if clinically indicated. 3. Small to moderate left pleural effusion with associated compressive atelectatic changes in the left lung. Small right pleural effusion. Electronically Signed   By: Ree Molt M.D.   On: 04/22/2024 08:21   CT CHEST WO CONTRAST Result Date: 04/21/2024 CLINICAL DATA:  Respiratory illness. EXAM: CT CHEST WITHOUT CONTRAST TECHNIQUE: Multidetector CT imaging of the chest was performed following the standard protocol without IV contrast. RADIATION DOSE REDUCTION:  This exam was performed according to the departmental dose-optimization program which includes automated exposure control, adjustment of the mA and/or kV according to patient size and/or use of iterative reconstruction technique. COMPARISON:  Chest CT dated 04/14/2024. FINDINGS: Evaluation of this exam is limited in the absence of intravenous contrast and streak artifact caused by spinal hardware. Cardiovascular: There is no cardiomegaly or pericardial effusion. There is coronary vascular calcification. The thoracic aorta and central pulmonary arteries are grossly unremarkable. Mediastinum/Nodes: No obvious hilar or mediastinal adenopathy. Evaluation however is limited in the absence of intravenous contrast as well as consolidative changes the lungs. The esophagus is grossly unremarkable. No mediastinal fluid collection Lungs/Pleura: Similar appearance of small bilateral pleural effusions with pleural thickening. Bilateral lower lobe and lingula consolidation similar or slightly progressed since the prior CT. Findings may represent  atelectasis but pneumonia is not excluded. Faint ground-glass density in the right lung may represent atelectasis or pneumonia. No pneumothorax. Linear secretions noted in the trachea. The central airways are patent. Upper Abdomen: No acute abnormality. Musculoskeletal: Osteopenia with degenerative changes of the spine. Posterior fusion hardware. No acute osseous pathology. IMPRESSION: 1. Similar appearance of small bilateral pleural effusions with pleural thickening. 2. Bilateral lower lobe and lingula consolidation similar or slightly progressed since the prior CT. Findings may represent atelectasis but pneumonia is not excluded. Electronically Signed   By: Vanetta Chou M.D.   On: 04/21/2024 16:48               LOS: 8 days   Lajoy Vanamburg  [facility]   Pager on [email or web address]. If 7PM-7AM, please contact night-coverage at [email or web address]     04/23/2024, 1:18 PM

## 2024-04-23 NOTE — Progress Notes (Signed)
 Patient still vomiting copious amount. Writer educated patient on the need for NG tube insertion, but patient does not want NG inserted.

## 2024-04-24 DIAGNOSIS — N179 Acute kidney failure, unspecified: Secondary | ICD-10-CM | POA: Diagnosis not present

## 2024-04-24 LAB — IRON AND TIBC
Iron: 58 ug/dL (ref 45–182)
Saturation Ratios: 35 % (ref 17.9–39.5)
TIBC: 168 ug/dL — ABNORMAL LOW (ref 250–450)
UIBC: 110 ug/dL

## 2024-04-24 LAB — CBC WITH DIFFERENTIAL/PLATELET
Abs Immature Granulocytes: 0.07 K/uL (ref 0.00–0.07)
Basophils Absolute: 0 K/uL (ref 0.0–0.1)
Basophils Relative: 0 %
Eosinophils Absolute: 0.1 K/uL (ref 0.0–0.5)
Eosinophils Relative: 1 %
HCT: 35.7 % — ABNORMAL LOW (ref 39.0–52.0)
Hemoglobin: 11.4 g/dL — ABNORMAL LOW (ref 13.0–17.0)
Immature Granulocytes: 1 %
Lymphocytes Relative: 10 %
Lymphs Abs: 1 K/uL (ref 0.7–4.0)
MCH: 29.6 pg (ref 26.0–34.0)
MCHC: 31.9 g/dL (ref 30.0–36.0)
MCV: 92.7 fL (ref 80.0–100.0)
Monocytes Absolute: 0.7 K/uL (ref 0.1–1.0)
Monocytes Relative: 7 %
Neutro Abs: 7.9 K/uL — ABNORMAL HIGH (ref 1.7–7.7)
Neutrophils Relative %: 81 %
Platelets: 208 K/uL (ref 150–400)
RBC: 3.85 MIL/uL — ABNORMAL LOW (ref 4.22–5.81)
RDW: 13.7 % (ref 11.5–15.5)
WBC: 9.8 K/uL (ref 4.0–10.5)
nRBC: 0 % (ref 0.0–0.2)

## 2024-04-24 LAB — PARATHYROID HORMONE, INTACT (NO CA): PTH: 177 pg/mL — ABNORMAL HIGH (ref 15–65)

## 2024-04-24 LAB — GLUCOSE, CAPILLARY
Glucose-Capillary: 179 mg/dL — ABNORMAL HIGH (ref 70–99)
Glucose-Capillary: 168 mg/dL — ABNORMAL HIGH (ref 70–99)
Glucose-Capillary: 150 mg/dL — ABNORMAL HIGH (ref 70–99)
Glucose-Capillary: 184 mg/dL — ABNORMAL HIGH (ref 70–99)

## 2024-04-24 LAB — RENAL FUNCTION PANEL
Albumin: 2.2 g/dL — ABNORMAL LOW (ref 3.5–5.0)
Anion gap: 13 (ref 5–15)
BUN: 69 mg/dL — ABNORMAL HIGH (ref 8–23)
CO2: 26 mmol/L (ref 22–32)
Calcium: 7.6 mg/dL — ABNORMAL LOW (ref 8.9–10.3)
Chloride: 109 mmol/L (ref 98–111)
Creatinine, Ser: 3.64 mg/dL — ABNORMAL HIGH (ref 0.61–1.24)
GFR, Estimated: 18 mL/min — ABNORMAL LOW (ref >60)
Glucose, Bld: 202 mg/dL — ABNORMAL HIGH (ref 70–99)
Phosphorus: 5.2 mg/dL — ABNORMAL HIGH (ref 2.5–4.6)
Potassium: 4.1 mmol/L (ref 3.5–5.1)
Sodium: 148 mmol/L — ABNORMAL HIGH (ref 135–145)

## 2024-04-24 LAB — PROTEIN / CREATININE RATIO, URINE
Creatinine, Urine: 97 mg/dL
Protein Creatinine Ratio: 15.41 mg/mg{creat} — ABNORMAL HIGH (ref 0.00–0.15)
Total Protein, Urine: 1495 mg/dL

## 2024-04-24 LAB — FERRITIN: Ferritin: 90 ng/mL (ref 24–336)

## 2024-04-24 LAB — VITAMIN B12: Vitamin B-12: 240 pg/mL (ref 180–914)

## 2024-04-24 LAB — MAGNESIUM: Magnesium: 1.6 mg/dL — ABNORMAL LOW (ref 1.7–2.4)

## 2024-04-24 MED ORDER — DEXTROSE 5 % IV SOLN: INTRAVENOUS | Status: AC

## 2024-04-24 MED ORDER — MAGNESIUM SULFATE 2 GM/50ML IV SOLN
2.0000 g | Freq: Once | INTRAVENOUS | Status: AC
  Administered 2024-04-24: 2 g via INTRAVENOUS
  Filled 2024-04-24: qty 50

## 2024-04-24 NOTE — Plan of Care (Signed)

## 2024-04-24 NOTE — Progress Notes (Signed)
 Progress Note    Eugene [name]  FMW:[id number] DOB: August 27, 1961  DOA: 04/14/2024 PCP: Care, [facility]      Brief Narrative:    Medical records reviewed and are as summarized below:  Eugene [name] is a 63 y.o. male with medical history significant for hepatitis C, osteomyelitis, spinal stenosis, IBS, diabetes mellitus, depression, anxiety, history of BKA, neurogenic bladder with chronic suprapubic catheter, recurrent fecal impaction, history of bowel obstruction requiring NG tube decompression, who was sent from SNF because of constipation.  Reportedly, Eugene [name] had not moved his bowels for about 6 days prior to admission.  Eugene [name] also had an episode of vomiting.  Eugene [name] was admitted to the hospital for constipation AKI.       Assessment/Plan:   Principal Problem:   AKI (acute kidney injury) (HCC) Active Problems:   Catheter-associated urinary tract infection (HCC)   Hyperkalemia   Suprapubic catheter (HCC)   Acute kidney injury (HCC)   Acid-base disorder, mixed   Acute hypoxic respiratory failure (HCC)   Ileus (HCC)    Body mass index is 50.38 kg/m.  (Morbid obesity)    AKI: Creatinine down from 3.74-3.64.  It was 4.23 on admission.  Suspect underlying chronic kidney disease.  Eugene [name] has proteinuria as well. Last creatinine on record was 0.84 in July 2022. Consulted nephrologist to assist with management   Acute hypoxic respiratory failure: Eugene [name] is down from 6 L to 5 L oxygen.  Attempt to wean off oxygen.   VQ scan was low probability for PE. BNP 199.7 2D echo on 04/19/2024 showed EF estimated at 55 to 60%, indeterminate LV diastolic parameters, no significant valvular abnormality detected but overall poor windows for evaluation of LV function by transthoracic echocardiography.   Adynamic ileus, gastroparesis, intractable nausea and vomiting, chronic constipation: NG tube was placed on 04/23/2024 for gastric decompression.  Continue NG tube to intermittent wall suction  as needed.  Continue IV fluids because Eugene [name] remains NPO. Eugene [name] was evaluated by general surgeon on 04/22/2024.  No bowel obstruction and surgeon has signed off.   Probable acute complicated UTI in the setting of underlying chronic suprapubic catheter: Eugene [name] has been on antibiotics since 04/14/2024.   Discontinue IV antibiotics and monitor   Probable bilateral lower lobe pneumonia on CT chest: Discontinue IV antibiotics and monitor.     Type II DM: Continue glargine 10 units daily.NovoLog  as needed for hyperglycemia   Possible acute exacerbation of chronic HFpEF: Appears compensated for now. 2D echo was unremarkable.   Chronic opioid use: Continue oxycodone  as needed.   Hypomagnesemia: Magnesium  down to 1.6.  Replete with IV magnesium  sulfate.   Leukocytosis: Improved.  Probably recent prednisone  on 04/20/2024 contributed leukocytosis.   Vitamin D  deficiency: Vitamin D  level was 7.92.  Start high-dose vitamin D  supplement when vomiting improves  Iron  studies showed iron  studies showed iron  level 58, saturation ratio 35, TIBC 168, ferritin 90 Vitamin B12 level is pending   Diet Order             Diet NPO time specified  Diet effective now                            Consultants: General Surgeon  Procedures: None    Medications:    amLODipine   10 mg Per NG tube Daily   budesonide  (PULMICORT ) nebulizer solution  0.25 mg Nebulization BID   Chlorhexidine  Gluconate Cloth  6 each Topical Q0600   diatrizoate   meglumine -sodium  90 mL Per NG tube Once   enoxaparin  (LOVENOX ) injection  30 mg Subcutaneous Q24H   gabapentin   300 mg Per Tube QHS   insulin  aspart  0-5 Units Subcutaneous QHS   insulin  aspart  0-9 Units Subcutaneous TID WC   insulin  glargine-yfgn  10 Units Subcutaneous Q24H   levothyroxine   175 mcg Per NG tube Q0600   linaclotide   145 mcg Per Tube QAC breakfast   melatonin  10 mg Per Tube QHS   metoprolol  tartrate  12.5 mg Per Tube BID   nicotine   21 mg  Transdermal Daily   polyethylene glycol  17 g Per NG tube Daily   senna-docusate  1 tablet Per NG tube BID   simethicone   80 mg Per NG tube QID   sodium bicarbonate   650 mg Per NG tube TID   Continuous Infusions:  cefTRIAXone  (ROCEPHIN )  IV 2 g (04/23/24 1633)   dextrose  75 mL/hr at 04/24/24 1232   promethazine  (PHENERGAN ) injection (IM or IVPB) 12.5 mg (04/24/24 0918)     Anti-infectives (From admission, onward)    Start     Dose/Rate Route Frequency Ordered Stop   04/22/24 1600  cefTRIAXone  (ROCEPHIN ) 2 g in sodium chloride  0.9 % 100 mL IVPB        2 g 200 mL/hr over 30 Minutes Intravenous Every 24 hours 04/22/24 1417     04/17/24 2200  cephALEXin  (KEFLEX ) capsule 500 mg  Status:  Discontinued        500 mg Oral Every 8 hours 04/17/24 1332 04/22/24 1417   04/16/24 1145  cefTRIAXone  (ROCEPHIN ) 1 g in sodium chloride  0.9 % 100 mL IVPB  Status:  Discontinued        1 g 200 mL/hr over 30 Minutes Intravenous Every 24 hours 04/16/24 1059 04/17/24 1332   04/14/24 0930  cefTRIAXone  (ROCEPHIN ) 2 g in sodium chloride  0.9 % 100 mL IVPB  Status:  Discontinued        2 g 200 mL/hr over 30 Minutes Intravenous  Once 04/14/24 9071 04/14/24 0946              Family Communication/Anticipated D/C date and plan/Code Status   DVT prophylaxis: enoxaparin  (LOVENOX ) injection 30 mg Start: 04/21/24 1100     Code Status: Do not attempt resuscitation (DNR) PRE-ARREST INTERVENTIONS DESIRED  Family Communication: None Disposition Plan: Plan to discharge to SNF   Status is: Inpatient Remains inpatient appropriate because: Intractable nausea and vomiting       Subjective:   Interval events noted.  Eugene [name] complains of gagging.  No vomiting.  Eugene [name] still has some abdominal pain though it is better.  Nurse tech was at the bedside to check his blood sugar  Objective:    Vitals:   04/24/24 0451 04/24/24 0832 04/24/24 1154 04/24/24 1200  BP: (!) 161/70 (!) 159/79 (!) 198/97 (!) 154/78   Pulse: 90 96 89   Resp: 18 14 16    Temp: 98.7 F (37.1 C) 98 F (36.7 C) 98.6 F (37 C)   TempSrc: Oral     SpO2: 97% 96% 95%   Weight:      Height:       No data found.   Intake/Output Summary (Last 24 hours) at 04/24/2024 1253 Last data filed at 04/24/2024 1232 Gross per 24 hour  Intake 1564.67 ml  Output 4300 ml  Net -2735.33 ml   Filed Weights   04/14/24 0638 04/14/24 2358  Weight: 117.1 kg 117 kg  Exam:   GEN: No acute distress SKIN: Warm and dry EYES: No pallor or icterus, NG tube in place ENT: MMM CV: RRR PULM: CTA B ABD: soft, obese/distended, NT, +BS CNS: AAO x 3, non focal EXT: Bilateral BKA. GU: Suprapubic catheter draining amber urine         Data Reviewed:   I have personally reviewed following labs and imaging studies:  Labs: Labs show the following:   Basic Metabolic Panel: Recent Labs  Lab 04/20/24 0456 04/20/24 0912 04/21/24 0530 04/21/24 1430 04/22/24 0415 04/23/24 0905 04/24/24 0809  NA 138   < > 138 134* 139 145 148*  K 6.2*   < > 5.6* 5.2* 4.7 4.3 4.1  CL 111   < > 109 106 107 111 109  CO2 16*   < > 21* 19* 23 23 26   GLUCOSE 262*   < > 340* 317* 276* 219* 202*  BUN 65*   < > 74* 76* 81* 76* 69*  CREATININE 4.05*   < > 3.96* 3.99* 3.89* 3.74* 3.64*  CALCIUM  7.5*   < > 7.6* 7.3* 7.8* 7.8* 7.6*  MG 2.0  --  1.8  --  1.9 1.7 1.6*  PHOS  --   --  5.9*  --   --  5.3* 5.2*   < > = values in this interval not displayed.   GFR Estimated Creatinine Clearance: 23.2 mL/min (A) (by C-G formula based on SCr of 3.64 mg/dL (H)). Liver Function Tests: Recent Labs  Lab 04/21/24 0530 04/24/24 0809  AST 17  --   ALT 14  --   ALKPHOS 51  --   BILITOT 0.3  --   PROT 6.7  --   ALBUMIN  2.1* 2.2*   No results for input(s): LIPASE, AMYLASE in the last 168 hours. No results for input(s): AMMONIA in the last 168 hours. Coagulation profile Recent Labs  Lab 04/20/24 0912  INR 1.1    CBC: Recent Labs  Lab 04/20/24 0912  04/21/24 0530 04/22/24 0415 04/23/24 0905 04/24/24 0809  WBC 8.1 8.7 16.1* 11.1* 9.8  NEUTROABS  --   --   --  9.2* 7.9*  HGB 9.8* 9.9* 11.2* 11.5* 11.4*  HCT 31.0* 31.5* 34.5* 36.8* 35.7*  MCV 93.1 92.6 90.8 92.9 92.7  PLT 194 210 269 225 208   Cardiac Enzymes: No results for input(s): CKTOTAL, CKMB, CKMBINDEX, TROPONINI in the last 168 hours. BNP (last 3 results) No results for input(s): PROBNP in the last 8760 hours. CBG: Recent Labs  Lab 04/23/24 1146 04/23/24 1645 04/23/24 2206 04/24/24 0857 04/24/24 1221  GLUCAP 208* 182* 186* 179* 168*   D-Dimer: No results for input(s): DDIMER in the last 72 hours. Hgb A1c: No results for input(s): HGBA1C in the last 72 hours. Lipid Profile: No results for input(s): CHOL, HDL, LDLCALC, TRIG, CHOLHDL, LDLDIRECT in the last 72 hours. Thyroid function studies: No results for input(s): TSH, T4TOTAL, T3FREE, THYROIDAB in the last 72 hours.  Invalid input(s): FREET3 Anemia work up: Recent Labs    04/24/24 0809  FERRITIN 90  TIBC 168*  IRON  58   Sepsis Labs: Recent Labs  Lab 04/21/24 0530 04/22/24 0415 04/23/24 0905 04/24/24 0809  WBC 8.7 16.1* 11.1* 9.8    Microbiology No results found for this or any previous visit (from the past 240 hours).   Procedures and diagnostic studies:  DG Abd Portable 1V Result Date: 04/23/2024 CLINICAL DATA:  NG tube placement EXAM: PORTABLE ABDOMEN - 1 VIEW COMPARISON:  None Available. FINDINGS: NG tube with tip in the stomach. Side port is above the GE junction. The stomach is gas distended. IMPRESSION: 1. NG tube with tip in the stomach. Side port above the GE junction. Recommend advancement by 4 cm. 2. Gas in the stomach. Electronically Signed   By: Jackquline Boxer M.D.   On: 04/23/2024 10:14               LOS: 9 days   Sumaya Riedesel  [facility]   Pager on [email or web address]. If 7PM-7AM, please contact night-coverage at  [email or web address]     04/24/2024, 12:53 PM

## 2024-04-24 NOTE — Plan of Care (Signed)

## 2024-04-24 NOTE — Consult Note (Addendum)
 [facility]  CONSULT NOTE    Date: 04/24/2024                  Patient Name:  Eugene [name]  MRN: [id number]  DOB: [birth year]  Age / Sex: 63 y.o., male         PCP: [facility]                 Service Requesting Consult: TRH                 Reason for Consult: Acute kidney injury            History of Present Illness: [name] is a 63 y.o.  male with past medical conditions including diabetes, bilateral BKA, suprapubic catheter, hepatitis C, who was admitted to [facility] on 04/14/2024 for Suprapubic catheter (HCC) [Z93.59] AKI (acute kidney injury) (HCC) [N17.9] Acute kidney injury (HCC) [N17.9]  Patient initially presents to the emergency department with constipation and lack of BM for 6 days.  Patient found to have ileus and is currently with an NG tube.  Patient seen laying in bed.  Unable to provide much history.Chart review has patient residing in skilled nursing facility.  Initial CT abdomen pelvis showed no obvious bowel obstruction however moderate colonic stool burden noted.  Patient has no recent labs prior to this admission.  Creatinine on admission 4.23.  Currently 3.64 today.  UA was positive for proteinuria.   Medications: Outpatient medications: Medications Prior to Admission  Medication Sig Dispense Refill Last Dose/Taking   acetaminophen  (TYLENOL ) 325 MG tablet Take 650 mg by mouth in the morning and at bedtime.   04/13/2024 Noon   bisacodyl  (DULCOLAX) 10 MG suppository Place 10 mg rectally daily as needed for moderate constipation.   04/12/2024   Doxepin  HCl 6 MG TABS Take 6 mg by mouth at bedtime.   04/13/2024 Bedtime   furosemide  (LASIX ) 40 MG tablet Take 1 tablet (40 mg total) by mouth daily. 30 tablet 0 04/13/2024 Morning   gabapentin  (NEURONTIN ) 300 MG capsule Take 300 mg by mouth at bedtime.   04/13/2024 Bedtime   hydrocortisone  cream 1 % Apply 1 Application topically daily.   04/08/2024   insulin  glargine (LANTUS ) 100  UNIT/ML injection Inject 0.25 mLs (25 Units total) into the skin daily. (Patient taking differently: Inject 40 Units into the skin 2 (two) times daily.) 10 mL 11 04/13/2024 at  8:00 AM   insulin  lispro (HUMALOG ) 100 UNIT/ML injection Inject 0-15 Units into the skin 4 (four) times daily -  before meals and at bedtime. <150= 0 units 151-200= 3 units 201-250= 6 units 251-300= 9 units 301-350= 12 units 351-400= 15 units >400 call MD (Patient taking differently: Inject 0-15 Units into the skin 4 (four) times daily -  before meals and at bedtime. <150= 0 units 151-200= 3 units 201-250= 6 units 251-300= 9 units 301-350= 12 units 351-400= 15 units >400 call MD)   04/13/2024 Noon   lactulose  (CEPHULAC ) 20 g packet Take 20 g by mouth 2 (two) times daily.   04/13/2024 Evening   levofloxacin (LEVAQUIN) 750 MG tablet Take 750 mg by mouth daily.   04/13/2024 Morning   levothyroxine  (SYNTHROID ) 175 MCG tablet Take 175 mcg by mouth at bedtime.   04/13/2024 Bedtime   linaclotide  (LINZESS ) 145 MCG CAPS capsule Take 1 capsule (145 mcg total) by mouth daily before breakfast. 30 capsule 0 04/13/2024 Morning   Melatonin 5 MG TABS Take 10 mg by  mouth at bedtime.   04/13/2024 Bedtime   ondansetron  (ZOFRAN ) 4 MG tablet Take 4 mg by mouth daily.   04/13/2024 Morning   oxyCODONE  (OXY IR/ROXICODONE ) 5 MG immediate release tablet Take 2 tablets (10 mg total) by mouth every 8 (eight) hours. (Patient taking differently: Take 10 mg by mouth in the morning, at noon, and at bedtime.) 10 tablet 0 04/13/2024   polyethylene glycol (MIRALAX  / GLYCOLAX ) 17 g packet Take 17 g by mouth 2 (two) times daily. (Patient taking differently: Take 17 g by mouth daily.) 14 each 0 04/13/2024 at  8:00 PM   senna-docusate (SENOKOT-S) 8.6-50 MG tablet Take 1 tablet by mouth 2 (two) times daily.   04/13/2024 Evening   sitaGLIPtin (JANUVIA) 100 MG tablet Take 100 mg by mouth daily.   04/13/2024 Morning   sodium phosphate  Pediatric (FLEET) 3.5-9.5 GM/59ML  enema Place 1 enema rectally every other [name] as needed for severe constipation.   04/12/2024   amLODipine  (NORVASC ) 10 MG tablet Take 10 mg by mouth daily. (Patient not taking: Reported on 04/14/2024)   Not Taking   metoprolol  succinate (TOPROL -XL) 25 MG 24 hr tablet Take 25 mg by mouth daily. (Patient not taking: Reported on 04/14/2024)   Not Taking    Current medications: Current Facility-Administered Medications  Medication Dose Route Frequency Provider Last Rate Last Admin   amLODipine  (NORVASC ) tablet 10 mg  10 mg Per NG tube Daily Jens Durand, MD   10 mg at 04/24/24 9092   bisacodyl  (DULCOLAX) suppository 10 mg  10 mg Rectal Daily PRN Arlon Carliss ORN, DO       budesonide  (PULMICORT ) nebulizer solution 0.25 mg  0.25 mg Nebulization BID Jesus America, NP   0.25 mg at 04/23/24 2047   cefTRIAXone  (ROCEPHIN ) 2 g in sodium chloride  0.9 % 100 mL IVPB  2 g Intravenous Q24H Rashid, Farhan, MD 200 mL/hr at 04/24/24 1505 2 g at 04/24/24 1505   Chlorhexidine  Gluconate Cloth 2 % PADS 6 each  6 each Topical Q0600 Arlon Carliss ORN, DO   6 each at 04/24/24 9361   dextrose  5 % solution   Intravenous Continuous Druscilla Bald, NP 75 mL/hr at 04/24/24 1232 New Bag at 04/24/24 1232   diatrizoate  meglumine -sodium (GASTROGRAFIN) 66-10 % solution 90 mL  90 mL Per NG tube Once [employer], [place], PA-C       enoxaparin  (LOVENOX ) injection 30 mg  30 mg Subcutaneous Q24H Calkins, Derek W, DO   30 mg at 04/24/24 1135   gabapentin  (NEURONTIN ) capsule 300 mg  300 mg Per Tube QHS Jens Durand, MD   300 mg at 04/23/24 2129   hydrALAZINE  (APRESOLINE ) injection 10 mg  10 mg Intravenous Q6H PRN Rashid, Farhan, MD   10 mg at 04/23/24 9043   hydrocortisone  cream 1 % 1 Application  1 Application Topical TID PRN Awanda City, MD   1 Application at 04/19/24 1744   insulin  aspart (novoLOG ) injection 0-5 Units  0-5 Units Subcutaneous QHS Awanda City, MD   3 Units at 04/21/24 2148   insulin  aspart (novoLOG ) injection  0-9 Units  0-9 Units Subcutaneous TID WC Awanda City, MD   2 Units at 04/24/24 1233   insulin  glargine-yfgn (SEMGLEE ) injection 10 Units  10 Units Subcutaneous Q24H Arlon Carliss ORN, DO   10 Units at 04/24/24 0910   ipratropium-albuterol  (DUONEB) 0.5-2.5 (3) MG/3ML nebulizer solution 3 mL  3 mL Nebulization Q4H PRN Jesus America, NP   3 mL at 04/19/24 9272   levothyroxine  (  SYNTHROID ) tablet 175 mcg  175 mcg Per NG tube Q0600 Jens Durand, MD   175 mcg at 04/24/24 9367   linaclotide  (LINZESS ) capsule 145 mcg  145 mcg Per Tube QAC breakfast Jens Durand, MD   145 mcg at 04/24/24 0908   melatonin tablet 10 mg  10 mg Per Tube QHS Ayiku, Bernard, MD   10 mg at 04/23/24 2129   metoprolol  tartrate (LOPRESSOR ) 25 mg/10 mL oral suspension 12.5 mg  12.5 mg Per Tube BID Jens Durand, MD   12.5 mg at 04/24/24 0910   morphine  (PF) 2 MG/ML injection 1 mg  1 mg Intravenous Q3H PRN Rashid, Farhan, MD   1 mg at 04/24/24 1232   nicotine  (NICODERM CQ  - dosed in mg/24 hours) patch 21 mg  21 mg Transdermal Daily Arlon Carliss ORN, DO   21 mg at 04/24/24 9090   ondansetron  (ZOFRAN ) injection 4 mg  4 mg Intravenous Q6H PRN Jesus America, NP   4 mg at 04/24/24 1511   oxyCODONE  (Oxy IR/ROXICODONE ) immediate release tablet 10 mg  10 mg Per NG tube TID PRN Jens Durand, MD   10 mg at 04/23/24 1336   polyethylene glycol (MIRALAX  / GLYCOLAX ) packet 17 g  17 g Per NG tube Daily Jens Durand, MD   17 g at 04/24/24 9090   promethazine  (PHENERGAN ) 12.5 mg in sodium chloride  0.9 % 50 mL IVPB  12.5 mg Intravenous Q6H PRN Mansy, Jan A, MD 150 mL/hr at 04/24/24 0918 12.5 mg at 04/24/24 9081   senna-docusate (Senokot-S) tablet 1 tablet  1 tablet Per NG tube BID Jens Durand, MD   1 tablet at 04/24/24 0907   simethicone  (MYLICON) chewable tablet 80 mg  80 mg Per NG tube QID Jens Durand, MD   80 mg at 04/24/24 1506   sodium bicarbonate  tablet 650 mg  650 mg Per NG tube TID Jens Durand, MD   650 mg at 04/24/24 1506    sodium phosphate  (FLEET) enema 1 enema  1 enema Rectal Daily PRN Arlon Carliss ORN, DO       traZODone  (DESYREL ) tablet 100 mg  100 mg Per NG tube QHS PRN Jens Durand, MD          Allergies: No Known Allergies    Past Medical History: Past Medical History:  Diagnosis Date   Anxiety    Depression    Diabetes mellitus without complication (HCC)    Hepatitis C    Hyperlipidemia    IBS (irritable bowel syndrome)    Osteomyelitis (HCC)    Spinal stenosis      Past Surgical History: Past Surgical History:  Procedure Laterality Date   BACK SURGERY     bilateral amputation Bilateral    CENTRAL LINE INSERTION-TUNNELED N/A 02/02/2019   Procedure: CENTRAL LINE INSERTION-TUNNELED;  Surgeon: Marea Selinda RAMAN, MD;  Location: [facility] INVASIVE CV LAB;  Service: Cardiovascular;  Laterality: N/A;   COLONOSCOPY WITH PROPOFOL  N/A 01/12/2020   Procedure: COLONOSCOPY WITH PROPOFOL ;  Surgeon: Jinny Carmine, MD;  Location: [facility] ENDOSCOPY;  Service: Endoscopy;  Laterality: N/A;   ESOPHAGOGASTRODUODENOSCOPY (EGD) WITH PROPOFOL  N/A 12/07/2019   Procedure: ESOPHAGOGASTRODUODENOSCOPY (EGD) WITH PROPOFOL ;  Surgeon: Unk Corinn Skiff, MD;  Location: [facility] ENDOSCOPY;  Service: Gastroenterology;  Laterality: N/A;   IR CATHETER TUBE CHANGE  12/04/2019   SPINAL FUSION     THORACIC SPINE SURGERY  01/2019   extensive washout     Family History: History reviewed. No pertinent family history.   Social History: Social  History   Socioeconomic History   Marital status: Single    Spouse name: Not on file   Number of children: Not on file   Years of education: Not on file   Highest education level: Not on file  Occupational History   Not on file  Tobacco Use   Smoking status: Some Days    Current packs/[name]: 0.25    Types: Cigarettes   Smokeless tobacco: Never  Vaping Use   Vaping status: Never Used  Substance and Sexual Activity   Alcohol use: Not Currently   Drug use: Not Currently   Sexual activity:  Not Currently  Other Topics Concern   Not on file  Social History Narrative   Resides at [facility]   Social Drivers of Health   Financial Resource Strain: Medium Risk (02/02/2019)   Overall Financial Resource Strain (CARDIA)    Difficulty of Paying Living Expenses: Somewhat hard  Food Insecurity: Food Insecurity Present (02/02/2019)   Hunger Vital Sign    Worried About Running Out of Food in the Last Year: Sometimes true    Ran Out of Food in the Last Year: Sometimes true  Transportation Needs: No Transportation Needs (02/02/2019)   PRAPARE - Administrator, Civil Service (Medical): No    Lack of Transportation (Non-Medical): No  Recent Concern: Transportation Needs - Unmet Transportation Needs (11/07/2018)   Received from [facility] - Transportation    Lack of Transportation (Medical): Yes    Lack of Transportation (Non-Medical): Yes  Physical Activity: Unknown (02/02/2019)   Exercise Vital Sign    Days of Exercise per Week: 0 days    Minutes of Exercise per Session: Not on file  Stress: Stress Concern Present (02/02/2019)   [employer] of Occupational Health - Occupational Stress Questionnaire    Feeling of Stress : To some extent  Social Connections: Somewhat Isolated (02/02/2019)   Social Connection and Isolation Panel    Frequency of Communication with Friends and Family: More than three times a week    Frequency of Social Gatherings with Friends and Family: More than three times a week    Attends Religious Services: More than 4 times per year    Active Member of [employer] or Organizations: Not on file    Attends Banker Meetings: Never    Marital Status: Never married  Intimate Partner Violence: Unknown (02/02/2019)   Humiliation, Afraid, Rape, and Kick questionnaire    Fear of Current or Ex-Partner: Patient declined    Emotionally Abused: Patient declined    Physically Abused: Patient declined    Sexually Abused: Patient  declined     Review of Systems: Review of Systems  Unable to perform ROS: Mental status change    Vital Signs: Blood pressure (!) 154/78, pulse 89, temperature 98.6 F (37 C), resp. rate 16, height 5' (1.524 m), weight 117 kg, SpO2 95%.  Weight trends: Filed Weights   04/14/24 9361 04/14/24 2358  Weight: 117.1 kg 117 kg    Physical Exam: General: NAD, ill-appearing  Head: Normocephalic, atraumatic.  Dry oral mucosal membranes. NGT  Eyes: Anicteric  Neck: Supple  Lungs:  Clear to auscultation  Heart: Regular rate and rhythm  Abdomen:  Soft, nontender, obese  Extremities: No peripheral edema. Bilat BKA  Neurologic: Nonfocal, moving all four extremities  Skin: No lesions  Access: None  GU-Foley   Lab results: Basic Metabolic Panel: Recent Labs  Lab 04/20/24 0456 04/20/24 0912 04/21/24 0530 04/21/24  1430 04/22/24 0415 04/23/24 0905 04/24/24 0809  NA 138   < > 138   < > 139 145 148*  K 6.2*   < > 5.6*   < > 4.7 4.3 4.1  CL 111   < > 109   < > 107 111 109  CO2 16*   < > 21*   < > 23 23 26   GLUCOSE 262*   < > 340*   < > 276* 219* 202*  BUN 65*   < > 74*   < > 81* 76* 69*  CREATININE 4.05*   < > 3.96*   < > 3.89* 3.74* 3.64*  CALCIUM  7.5*   < > 7.6*   < > 7.8* 7.8* 7.6*  MG 2.0  --  1.8  --  1.9 1.7 1.6*  PHOS  --   --  5.9*  --   --  5.3* 5.2*   < > = values in this interval not displayed.    Liver Function Tests: Recent Labs  Lab 04/21/24 0530 04/24/24 0809  AST 17  --   ALT 14  --   ALKPHOS 51  --   BILITOT 0.3  --   PROT 6.7  --   ALBUMIN  2.1* 2.2*   No results for input(s): LIPASE, AMYLASE in the last 168 hours. No results for input(s): AMMONIA in the last 168 hours.  CBC: Recent Labs  Lab 04/20/24 0912 04/21/24 0530 04/22/24 0415 04/23/24 0905 04/24/24 0809  WBC 8.1 8.7 16.1* 11.1* 9.8  NEUTROABS  --   --   --  9.2* 7.9*  HGB 9.8* 9.9* 11.2* 11.5* 11.4*  HCT 31.0* 31.5* 34.5* 36.8* 35.7*  MCV 93.1 92.6 90.8 92.9 92.7  PLT 194  210 269 225 208    Cardiac Enzymes: No results for input(s): CKTOTAL, CKMB, CKMBINDEX, TROPONINI in the last 168 hours.  BNP: Invalid input(s): POCBNP  CBG: Recent Labs  Lab 04/23/24 1146 04/23/24 1645 04/23/24 2206 04/24/24 0857 04/24/24 1221  GLUCAP 208* 182* 186* 179* 168*    Microbiology: Results for orders placed or performed during the hospital encounter of 04/14/24  Urine Culture     Status: Abnormal   Collection Time: 04/14/24  6:43 AM   Specimen: Urine, Random  Result Value Ref Range Status   Specimen Description   Final    URINE, RANDOM Performed at [facility], [address], [place], [place] [address]    Special Requests   Final    NONE Reflexed from [id number] Performed at [facility], [address] [place], [place] [address]    Culture (A)  Final    >=100,000 COLONIES/mL PROTEUS MIRABILIS 10,000 COLONIES/mL STAPHYLOCOCCUS AUREUS    Report Status 04/17/2024 FINAL  Final   Organism ID, Bacteria PROTEUS MIRABILIS (A)  Final   Organism ID, Bacteria STAPHYLOCOCCUS AUREUS (A)  Final      Susceptibility   Proteus mirabilis - MIC*    AMPICILLIN <=2 SENSITIVE Sensitive     CEFAZOLIN  <=4 SENSITIVE Sensitive     CEFEPIME  <=0.12 SENSITIVE Sensitive     CEFTRIAXONE  <=0.25 SENSITIVE Sensitive     CIPROFLOXACIN  >=4 RESISTANT Resistant     GENTAMICIN <=1 SENSITIVE Sensitive     IMIPENEM 2 SENSITIVE Sensitive     NITROFURANTOIN RESISTANT Resistant     TRIMETH /SULFA  <=20 SENSITIVE Sensitive     AMPICILLIN/SULBACTAM <=2 SENSITIVE Sensitive     PIP/TAZO <=4 SENSITIVE Sensitive ug/mL    * >=100,000 COLONIES/mL PROTEUS MIRABILIS  Staphylococcus aureus - MIC*    CIPROFLOXACIN  >=8 RESISTANT Resistant     GENTAMICIN <=0.5 SENSITIVE Sensitive     NITROFURANTOIN <=16 SENSITIVE Sensitive     OXACILLIN <=0.25 SENSITIVE Sensitive     TETRACYCLINE <=1 SENSITIVE Sensitive     VANCOMYCIN  1 SENSITIVE Sensitive     TRIMETH /SULFA  <=10 SENSITIVE  Sensitive     RIFAMPIN  <=0.5 SENSITIVE Sensitive     Inducible Clindamycin NEGATIVE Sensitive     LINEZOLID  2 SENSITIVE Sensitive     * 10,000 COLONIES/mL STAPHYLOCOCCUS AUREUS    Coagulation Studies: No results for input(s): LABPROT, INR in the last 72 hours.  Urinalysis: No results for input(s): COLORURINE, LABSPEC, PHURINE, GLUCOSEU, HGBUR, BILIRUBINUR, KETONESUR, PROTEINUR, UROBILINOGEN, NITRITE, LEUKOCYTESUR in the last 72 hours.  Invalid input(s): APPERANCEUR    Imaging: DG Abd Portable 1V Result Date: 04/23/2024 CLINICAL DATA:  NG tube placement EXAM: PORTABLE ABDOMEN - 1 VIEW COMPARISON:  None Available. FINDINGS: NG tube with tip in the stomach. Side port is above the GE junction. The stomach is gas distended. IMPRESSION: 1. NG tube with tip in the stomach. Side port above the GE junction. Recommend advancement by 4 cm. 2. Gas in the stomach. Electronically Signed   By: Jackquline Boxer M.D.   On: 04/23/2024 10:14     Assessment & Plan: Mr. Vardaan Depascale is a 63 y.o.  male with past medical conditions including diabetes, bilateral BKA, suprapubic catheter, hepatitis C, who was admitted to [facility] on 04/14/2024 for Suprapubic catheter (HCC) [Z93.59] AKI (acute kidney injury) (HCC) [N17.9] Acute kidney injury (HCC) [N17.9]   Acute kidney injury with proteinuria without recent baseline. Creatinine 4.23 on admission. Acute kidney injury likely secondary to dehydration and ileus. Would recommend IV fluids, D5 at 75ml/hr for hydration. No acute indication for dialysis. Will order protein creatinine ratio and MMP. Will continue to monitor.   2.  Hypertension, essential.  Currently prescribed amlodipine  and metoprolol .   LOS: 9 Ngai Parcell 8/1/20254:49 PM

## 2024-04-24 NOTE — TOC Progression Note (Signed)
 Transition of Care [facility]) - Progression Note    Patient Details  Name: Eugene [name] MRN: [id number] Date of Birth: 1961/08/18  [facility]) CM/SW Contact  Talonda Artist C Shonteria Abeln, RN Phone Number: 04/24/2024, 11:35 AM  Clinical Narrative:     Patent admitted from [facility] for LTC.                     Expected Discharge Plan and Services                                               Social Drivers of Health (SDOH) Interventions SDOH Screenings   Food Insecurity: Food Insecurity Present (02/02/2019)  Transportation Needs: No Transportation Needs (02/02/2019)  Recent Concern: Transportation Needs - Unmet Transportation Needs (11/07/2018)   Received from [facility]  Financial Resource Strain: Medium Risk (02/02/2019)  Physical Activity: Unknown (02/02/2019)  Social Connections: Somewhat Isolated (02/02/2019)  Stress: Stress Concern Present (02/02/2019)  Tobacco Use: High Risk (04/15/2024)    Readmission Risk Interventions     No data to display

## 2024-04-25 DIAGNOSIS — N179 Acute kidney failure, unspecified: Secondary | ICD-10-CM | POA: Diagnosis not present

## 2024-04-25 LAB — GLUCOSE, CAPILLARY
Glucose-Capillary: 191 mg/dL — ABNORMAL HIGH (ref 70–99)
Glucose-Capillary: 205 mg/dL — ABNORMAL HIGH (ref 70–99)
Glucose-Capillary: 198 mg/dL — ABNORMAL HIGH (ref 70–99)
Glucose-Capillary: 230 mg/dL — ABNORMAL HIGH (ref 70–99)
Glucose-Capillary: 222 mg/dL — ABNORMAL HIGH (ref 70–99)

## 2024-04-25 LAB — COMPREHENSIVE METABOLIC PANEL WITH GFR
ALT: 23 U/L (ref 0–44)
AST: 33 U/L (ref 15–41)
Albumin: 2 g/dL — ABNORMAL LOW (ref 3.5–5.0)
Alkaline Phosphatase: 43 U/L (ref 38–126)
Anion gap: 10 (ref 5–15)
BUN: 65 mg/dL — ABNORMAL HIGH (ref 8–23)
CO2: 28 mmol/L (ref 22–32)
Calcium: 7.6 mg/dL — ABNORMAL LOW (ref 8.9–10.3)
Chloride: 112 mmol/L — ABNORMAL HIGH (ref 98–111)
Creatinine, Ser: 3.46 mg/dL — ABNORMAL HIGH (ref 0.61–1.24)
GFR, Estimated: 19 mL/min — ABNORMAL LOW (ref >60)
Glucose, Bld: 206 mg/dL — ABNORMAL HIGH (ref 70–99)
Potassium: 3.8 mmol/L (ref 3.5–5.1)
Sodium: 150 mmol/L — ABNORMAL HIGH (ref 135–145)
Total Bilirubin: 0.5 mg/dL (ref 0.0–1.2)
Total Protein: 5.2 g/dL — ABNORMAL LOW (ref 6.5–8.1)

## 2024-04-25 LAB — MAGNESIUM: Magnesium: 2.1 mg/dL (ref 1.7–2.4)

## 2024-04-25 LAB — PHOSPHORUS: Phosphorus: 5.1 mg/dL — ABNORMAL HIGH (ref 2.5–4.6)

## 2024-04-25 MED ORDER — CALCIUM CARBONATE ANTACID 500 MG PO CHEW
1.0000 | CHEWABLE_TABLET | Freq: Three times a day (TID) | ORAL | Status: DC
  Administered 2024-04-25: 200 mg via ORAL
  Filled 2024-04-25: qty 1

## 2024-04-25 MED ORDER — DEXTROSE 5 % IV SOLN: INTRAVENOUS | Status: DC

## 2024-04-25 NOTE — Plan of Care (Signed)

## 2024-04-25 NOTE — Plan of Care (Signed)
  Problem: Education: Goal: Ability to describe self-care measures that may prevent or decrease complications (Diabetes Survival Skills Education) will improve Outcome: Progressing   Problem: Coping: Goal: Ability to adjust to condition or change in health will improve Outcome: Progressing   Problem: Fluid Volume: Goal: Ability to maintain a balanced intake and output will improve Outcome: Progressing   Problem: Health Behavior/Discharge Planning: Goal: Ability to identify and utilize available resources and services will improve Outcome: Progressing   Problem: Metabolic: Goal: Ability to maintain appropriate glucose levels will improve Outcome: Progressing   Problem: Nutritional: Goal: Maintenance of adequate nutrition will improve Outcome: Progressing   Problem: Clinical Measurements: Goal: Respiratory complications will improve Outcome: Progressing Goal: Cardiovascular complication will be avoided Outcome: Progressing

## 2024-04-25 NOTE — Progress Notes (Signed)
 [facility]  ROUNDING NOTE   Subjective:  [name] is a 63 y.o.  male with past medical conditions including diabetes, bilateral BKA, suprapubic catheter, hepatitis C, who was admitted to [facility] on 04/14/2024 for Suprapubic catheter (HCC) [Z93.59] AKI (acute kidney injury) (HCC) [N17.9] Acute kidney injury (HCC) [N17.9] Update: Patient seen and evaluated at bedside.  NG suction has been capped, patient requesting Gatorade.  No acute complaints   Objective:  Vital signs in last 24 hours:  Temp:  [97.6 F (36.4 C)-98.7 F (37.1 C)] 97.6 F (36.4 C) (08/02 1300) Pulse Rate:  [76-87] 81 (08/02 1130) Resp:  [18-20] 18 (08/02 1130) BP: (145-188)/(74-100) 160/76 (08/02 1130) SpO2:  [89 %-94 %] 94 % (08/02 1130) FiO2 (%):  [2 %] 2 % (08/01 2011)  Weight change:  Filed Weights   04/14/24 0638 04/14/24 2358  Weight: 117.1 kg 117 kg    Intake/Output: I/O last 3 completed shifts: In: 2015.5 [P.O.:300; I.V.:1153.4; NG/GT:270; IV Piggyback:292.1] Out: 4300 [Urine:1800; Emesis/NG output:2500]   Intake/Output this shift:  Total I/O In: 851.5 [P.O.:240; I.V.:551.5; NG/GT:60] Out: 200 [Emesis/NG output:200]  Physical Exam: General: Agitated, restless  Head: Normocephalic  Eyes: Anicteric  Neck: Supple  Lungs:  Diminished to auscultation  Heart: Regular rate   Abdomen:  Grossly distended and firm  Extremities:  trace peripheral edema.  Neurologic: Alert  Skin: No lesions  Access: None    Basic Metabolic Panel: Recent Labs  Lab 04/21/24 0530 04/21/24 1430 04/22/24 0415 04/23/24 0905 04/24/24 0809 04/25/24 0644  NA 138 134* 139 145 148* 150*  K 5.6* 5.2* 4.7 4.3 4.1 3.8  CL 109 106 107 111 109 112*  CO2 21* 19* 23 23 26 28   GLUCOSE 340* 317* 276* 219* 202* 206*  BUN 74* 76* 81* 76* 69* 65*  CREATININE 3.96* 3.99* 3.89* 3.74* 3.64* 3.46*  CALCIUM  7.6* 7.3* 7.8* 7.8* 7.6* 7.6*  MG 1.8  --  1.9 1.7 1.6* 2.1  PHOS 5.9*  --   --  5.3* 5.2* 5.1*     Liver Function Tests: Recent Labs  Lab 04/21/24 0530 04/24/24 0809 04/25/24 0644  AST 17  --  33  ALT 14  --  23  ALKPHOS 51  --  43  BILITOT 0.3  --  0.5  PROT 6.7  --  5.2*  ALBUMIN  2.1* 2.2* 2.0*   No results for input(s): LIPASE, AMYLASE in the last 168 hours. No results for input(s): AMMONIA in the last 168 hours.  CBC: Recent Labs  Lab 04/20/24 0912 04/21/24 0530 04/22/24 0415 04/23/24 0905 04/24/24 0809  WBC 8.1 8.7 16.1* 11.1* 9.8  NEUTROABS  --   --   --  9.2* 7.9*  HGB 9.8* 9.9* 11.2* 11.5* 11.4*  HCT 31.0* 31.5* 34.5* 36.8* 35.7*  MCV 93.1 92.6 90.8 92.9 92.7  PLT 194 210 269 225 208    Cardiac Enzymes: No results for input(s): CKTOTAL, CKMB, CKMBINDEX, TROPONINI in the last 168 hours.  BNP: Invalid input(s): POCBNP  CBG: Recent Labs  Lab 04/24/24 1739 04/24/24 2155 04/25/24 0418 04/25/24 0730 04/25/24 1128  GLUCAP 150* 184* 191* 205* 198*    Microbiology: Results for orders placed or performed during the hospital encounter of 04/14/24  Urine Culture     Status: Abnormal   Collection Time: 04/14/24  6:43 AM   Specimen: Urine, Random  Result Value Ref Range Status   Specimen Description   Final    URINE, RANDOM Performed at [facility],  [address] [place], [place] [address]    Special Requests   Final    NONE Reflexed from [id number] Performed at [facility], [address] [place], [place] [address]    Culture (A)  Final    >=100,000 COLONIES/mL PROTEUS MIRABILIS 10,000 COLONIES/mL STAPHYLOCOCCUS AUREUS    Report Status 04/17/2024 FINAL  Final   Organism ID, Bacteria PROTEUS MIRABILIS (A)  Final   Organism ID, Bacteria STAPHYLOCOCCUS AUREUS (A)  Final      Susceptibility   Proteus mirabilis - MIC*    AMPICILLIN <=2 SENSITIVE Sensitive     CEFAZOLIN  <=4 SENSITIVE Sensitive     CEFEPIME  <=0.12 SENSITIVE Sensitive     CEFTRIAXONE  <=0.25 SENSITIVE Sensitive     CIPROFLOXACIN  >=4 RESISTANT  Resistant     GENTAMICIN <=1 SENSITIVE Sensitive     IMIPENEM 2 SENSITIVE Sensitive     NITROFURANTOIN RESISTANT Resistant     TRIMETH /SULFA  <=20 SENSITIVE Sensitive     AMPICILLIN/SULBACTAM <=2 SENSITIVE Sensitive     PIP/TAZO <=4 SENSITIVE Sensitive ug/mL    * >=100,000 COLONIES/mL PROTEUS MIRABILIS   Staphylococcus aureus - MIC*    CIPROFLOXACIN  >=8 RESISTANT Resistant     GENTAMICIN <=0.5 SENSITIVE Sensitive     NITROFURANTOIN <=16 SENSITIVE Sensitive     OXACILLIN <=0.25 SENSITIVE Sensitive     TETRACYCLINE <=1 SENSITIVE Sensitive     VANCOMYCIN  1 SENSITIVE Sensitive     TRIMETH /SULFA  <=10 SENSITIVE Sensitive     RIFAMPIN  <=0.5 SENSITIVE Sensitive     Inducible Clindamycin NEGATIVE Sensitive     LINEZOLID  2 SENSITIVE Sensitive     * 10,000 COLONIES/mL STAPHYLOCOCCUS AUREUS    Coagulation Studies: No results for input(s): LABPROT, INR in the last 72 hours.  Urinalysis: No results for input(s): COLORURINE, LABSPEC, PHURINE, GLUCOSEU, HGBUR, BILIRUBINUR, KETONESUR, PROTEINUR, UROBILINOGEN, NITRITE, LEUKOCYTESUR in the last 72 hours.  Invalid input(s): APPERANCEUR    Imaging: No results found.   Medications:    cefTRIAXone  (ROCEPHIN )  IV 2 g (04/25/24 1500)   promethazine  (PHENERGAN ) injection (IM or IVPB) 12.5 mg (04/24/24 0918)    amLODipine   10 mg Per NG tube Daily   budesonide  (PULMICORT ) nebulizer solution  0.25 mg Nebulization BID   Chlorhexidine  Gluconate Cloth  6 each Topical Q0600   diatrizoate  meglumine -sodium  90 mL Per NG tube Once   enoxaparin  (LOVENOX ) injection  30 mg Subcutaneous Q24H   gabapentin   300 mg Per Tube QHS   insulin  aspart  0-5 Units Subcutaneous QHS   insulin  aspart  0-9 Units Subcutaneous TID WC   insulin  glargine-yfgn  10 Units Subcutaneous Q24H   levothyroxine   175 mcg Per NG tube Q0600   linaclotide   145 mcg Per Tube QAC breakfast   melatonin  10 mg Per Tube QHS   metoprolol  tartrate  12.5 mg Per Tube  BID   nicotine   21 mg Transdermal Daily   polyethylene glycol  17 g Per NG tube Daily   senna-docusate  1 tablet Per NG tube BID   simethicone   80 mg Per NG tube QID   bisacodyl , hydrALAZINE , hydrocortisone  cream, ipratropium-albuterol , morphine  injection, ondansetron  (ZOFRAN ) IV, oxyCODONE , promethazine  (PHENERGAN ) injection (IM or IVPB), sodium phosphate , traZODone   Assessment/ Plan:  [name] is a 63 y.o.  male  with past medical conditions including diabetes, bilateral BKA, suprapubic catheter, hepatitis C, who was admitted to [facility] on 04/14/2024 for Suprapubic catheter (HCC) [Z93.59] AKI (acute kidney injury) (HCC) [N17.9] Acute kidney injury (HCC) [N17.9]  Acute kidney injury with proteinuria without recent baseline. Creatinine 4.23 on admission. Acute kidney injury likely secondary to dehydration and ileus. Continue IV fluids, D5 at 75ml/hr for hydration. No acute indication for dialysis. Follow-up protein creatinine ratio and MMP. Will continue to monitor renal indices. Lab Results  Component Value Date   CREATININE 3.46 (H) 04/25/2024   CREATININE 3.64 (H) 04/24/2024   CREATININE 3.74 (H) 04/23/2024     Intake/Output Summary (Last 24 hours) at 04/25/2024 1545 Last data filed at 04/25/2024 1400 Gross per 24 hour  Intake 1940.58 ml  Output 1600 ml  Net 340.58 ml      2.  Hypertension, essential.  Currently prescribed amlodipine  and metoprolol .  BP 160/76 today.  3. Hypernatremia S Sodium 140 Likely in the setting of free water losses. Will continue D5 at 75ml/hr  4. Hypocalcemia Ca 7.6 Will order Calcium  Carbonate three times daily   LOS: 10 Tige Meas SHAUNNA Dines 8/2/20253:44 PM

## 2024-04-25 NOTE — Progress Notes (Signed)
 Progress Note    Eugene [name]  FMW:[id number] DOB: [birth year]  DOA: 04/14/2024 PCP: [facility]      Brief Narrative:    Medical records reviewed and are as summarized below:  Eugene [name] is a 63 y.o. male with medical history significant for hepatitis C, osteomyelitis, spinal stenosis, IBS, diabetes mellitus, depression, anxiety, history of BKA, neurogenic bladder with chronic suprapubic catheter, recurrent fecal impaction, history of bowel obstruction requiring NG tube decompression, who was sent from SNF because of constipation.  Reportedly, he had not moved his bowels for about 6 days prior to admission.  He also had an episode of vomiting.  He was admitted to the hospital for constipation AKI.       Assessment/Plan:   Principal Problem:   AKI (acute kidney injury) (HCC) Active Problems:   Catheter-associated urinary tract infection (HCC)   Hyperkalemia   Suprapubic catheter (HCC)   Acute kidney injury (HCC)   Acid-base disorder, mixed   Acute hypoxic respiratory failure (HCC)   Ileus (HCC)    Body mass index is 50.38 kg/m.  (Morbid obesity)   Hypernatremia: Sodium up from 145-148-150.  Discontinue oral sodium bicarbonate .  IV fluids changed from normal saline to 5% dextrose  infusion. AKI: Creatinine down from 3.74-3.64-3.46.  It was 4.23 on admission.  Suspect underlying chronic kidney disease.  He has proteinuria as well.  Urine protein creatinine ratio was 15.41. Follow-up with nephrologist Last creatinine on record was 0.84 in July 2022.    Acute hypoxic respiratory failure: Oxygen has been weaned down to 4 L/min.  Wean off oxygen as able. He required up to 6 L of oxygen via HFNC.  VQ scan was low probability for PE. BNP 199.7 2D echo on 04/19/2024 showed EF estimated at 55 to 60%, indeterminate LV diastolic parameters, no significant valvular abnormality detected but overall poor windows for evaluation of LV function by transthoracic  echocardiography.   Adynamic ileus, gastroparesis, intractable nausea and vomiting, chronic constipation: NG tube was placed on 04/23/2024 for gastric decompression.  Clamp NG tube.  Start clear liquid diet and advance as tolerated.  He was evaluated by general surgeon on 04/22/2024.  No bowel obstruction and surgeon has signed off.   Probable acute complicated UTI in the setting of underlying chronic suprapubic catheter: He has been on antibiotics since 04/14/2024.   IV ceftriaxone  was discontinued on 04/24/2024   Probable bilateral lower lobe pneumonia on CT chest: Completed antibiotics on 04/24/2024    Type II DM: Continue glargine 10 units daily.NovoLog  as needed for hyperglycemia   Possible acute exacerbation of chronic HFpEF: Appears compensated for now. 2D echo was unremarkable.   Chronic opioid use: Continue oxycodone  as needed.   Hypomagnesemia: Improved after repletion with IV mag sulfate.   Leukocytosis: Improved.  Probably recent prednisone  on 04/20/2024 contributed leukocytosis.   Vitamin D  deficiency: Vitamin D  level was 7.92.  Start high-dose vitamin D  supplement when vomiting improves  Iron  studies showed iron  studies showed iron  level 58, saturation ratio 35, TIBC 168, ferritin 90 Vitamin B12 level is 240   Diet Order             Diet clear liquid Fluid consistency: Thin  Diet effective now                            Consultants: General Surgeon  Procedures: None    Medications:    amLODipine   10 mg Per NG  tube Daily   budesonide  (PULMICORT ) nebulizer solution  0.25 mg Nebulization BID   Chlorhexidine  Gluconate Cloth  6 each Topical Q0600   diatrizoate  meglumine -sodium  90 mL Per NG tube Once   enoxaparin  (LOVENOX ) injection  30 mg Subcutaneous Q24H   gabapentin   300 mg Per Tube QHS   insulin  aspart  0-5 Units Subcutaneous QHS   insulin  aspart  0-9 Units Subcutaneous TID WC   insulin  glargine-yfgn  10 Units Subcutaneous Q24H    levothyroxine   175 mcg Per NG tube Q0600   linaclotide   145 mcg Per Tube QAC breakfast   melatonin  10 mg Per Tube QHS   metoprolol  tartrate  12.5 mg Per Tube BID   nicotine   21 mg Transdermal Daily   polyethylene glycol  17 g Per NG tube Daily   senna-docusate  1 tablet Per NG tube BID   simethicone   80 mg Per NG tube QID   Continuous Infusions:  cefTRIAXone  (ROCEPHIN )  IV 2 g (04/24/24 1505)   dextrose  75 mL/hr at 04/25/24 0438   promethazine  (PHENERGAN ) injection (IM or IVPB) 12.5 mg (04/24/24 0918)     Anti-infectives (From admission, onward)    Start     Dose/Rate Route Frequency Ordered Stop   04/22/24 1600  cefTRIAXone  (ROCEPHIN ) 2 g in sodium chloride  0.9 % 100 mL IVPB        2 g 200 mL/hr over 30 Minutes Intravenous Every 24 hours 04/22/24 1417     04/17/24 2200  cephALEXin  (KEFLEX ) capsule 500 mg  Status:  Discontinued        500 mg Oral Every 8 hours 04/17/24 1332 04/22/24 1417   04/16/24 1145  cefTRIAXone  (ROCEPHIN ) 1 g in sodium chloride  0.9 % 100 mL IVPB  Status:  Discontinued        1 g 200 mL/hr over 30 Minutes Intravenous Every 24 hours 04/16/24 1059 04/17/24 1332   04/14/24 0930  cefTRIAXone  (ROCEPHIN ) 2 g in sodium chloride  0.9 % 100 mL IVPB  Status:  Discontinued        2 g 200 mL/hr over 30 Minutes Intravenous  Once 04/14/24 9071 04/14/24 0946              Family Communication/Anticipated D/C date and plan/Code Status   DVT prophylaxis: enoxaparin  (LOVENOX ) injection 30 mg Start: 04/21/24 1100     Code Status: Do not attempt resuscitation (DNR) PRE-ARREST INTERVENTIONS DESIRED  Family Communication: None Disposition Plan: Plan to discharge to SNF   Status is: Inpatient Remains inpatient appropriate because: Intractable nausea and vomiting       Subjective:   Interval events noted.  He complains of nausea but he attributes this to the NG tube., Abdominal pain is better.  No bowel movement for 7 days.  Objective:    Vitals:    04/25/24 0733 04/25/24 0752 04/25/24 0804 04/25/24 1130  BP: (!) 172/74   (!) 160/76  Pulse: 76   81  Resp: 20   18  Temp: 98.7 F (37.1 C)   98 F (36.7 C)  TempSrc:      SpO2: (!) 89% 92% 93% 94%  Weight:      Height:       No data found.   Intake/Output Summary (Last 24 hours) at 04/25/2024 1233 Last data filed at 04/25/2024 0602 Gross per 24 hour  Intake 1775.49 ml  Output 1400 ml  Net 375.49 ml   Filed Weights   04/14/24 0638 04/14/24 2358  Weight: 117.1 kg  117 kg    Exam:   GEN: NAD SKIN: Warm and dry EYES: No pallor or icterus ENT: MMM, NG tube in place CV: RRR PULM: CTA B ABD: soft, obese, NT, +BS CNS: AAO x 3, non focal EXT: Bilateral BKA.  No edema or tenderness       Data Reviewed:   I have personally reviewed following labs and imaging studies:  Labs: Labs show the following:   Basic Metabolic Panel: Recent Labs  Lab 04/21/24 0530 04/21/24 1430 04/22/24 0415 04/23/24 0905 04/24/24 0809 04/25/24 0644  NA 138 134* 139 145 148* 150*  K 5.6* 5.2* 4.7 4.3 4.1 3.8  CL 109 106 107 111 109 112*  CO2 21* 19* 23 23 26 28   GLUCOSE 340* 317* 276* 219* 202* 206*  BUN 74* 76* 81* 76* 69* 65*  CREATININE 3.96* 3.99* 3.89* 3.74* 3.64* 3.46*  CALCIUM  7.6* 7.3* 7.8* 7.8* 7.6* 7.6*  MG 1.8  --  1.9 1.7 1.6* 2.1  PHOS 5.9*  --   --  5.3* 5.2* 5.1*   GFR Estimated Creatinine Clearance: 24.4 mL/min (A) (by C-G formula based on SCr of 3.46 mg/dL (H)). Liver Function Tests: Recent Labs  Lab 04/21/24 0530 04/24/24 0809 04/25/24 0644  AST 17  --  33  ALT 14  --  23  ALKPHOS 51  --  43  BILITOT 0.3  --  0.5  PROT 6.7  --  5.2*  ALBUMIN  2.1* 2.2* 2.0*   No results for input(s): LIPASE, AMYLASE in the last 168 hours. No results for input(s): AMMONIA in the last 168 hours. Coagulation profile Recent Labs  Lab 04/20/24 0912  INR 1.1    CBC: Recent Labs  Lab 04/20/24 0912 04/21/24 0530 04/22/24 0415 04/23/24 0905 04/24/24 0809  WBC  8.1 8.7 16.1* 11.1* 9.8  NEUTROABS  --   --   --  9.2* 7.9*  HGB 9.8* 9.9* 11.2* 11.5* 11.4*  HCT 31.0* 31.5* 34.5* 36.8* 35.7*  MCV 93.1 92.6 90.8 92.9 92.7  PLT 194 210 269 225 208   Cardiac Enzymes: No results for input(s): CKTOTAL, CKMB, CKMBINDEX, TROPONINI in the last 168 hours. BNP (last 3 results) No results for input(s): PROBNP in the last 8760 hours. CBG: Recent Labs  Lab 04/24/24 1739 04/24/24 2155 04/25/24 0418 04/25/24 0730 04/25/24 1128  GLUCAP 150* 184* 191* 205* 198*   D-Dimer: No results for input(s): DDIMER in the last 72 hours. Hgb A1c: No results for input(s): HGBA1C in the last 72 hours. Lipid Profile: No results for input(s): CHOL, HDL, LDLCALC, TRIG, CHOLHDL, LDLDIRECT in the last 72 hours. Thyroid function studies: No results for input(s): TSH, T4TOTAL, T3FREE, THYROIDAB in the last 72 hours.  Invalid input(s): FREET3 Anemia work up: Recent Labs    04/24/24 0809  VITAMINB12 240  FERRITIN 90  TIBC 168*  IRON  58   Sepsis Labs: Recent Labs  Lab 04/21/24 0530 04/22/24 0415 04/23/24 0905 04/24/24 0809  WBC 8.7 16.1* 11.1* 9.8    Microbiology No results found for this or any previous visit (from the past 240 hours).   Procedures and diagnostic studies:  No results found.              LOS: 10 days   Eugene [name]  [facility]   Pager on [email or web address]. If 7PM-7AM, please contact night-coverage at [email or web address]     04/25/2024, 12:33 PM

## 2024-04-26 DIAGNOSIS — N179 Acute kidney failure, unspecified: Secondary | ICD-10-CM | POA: Diagnosis not present

## 2024-04-26 LAB — CBC
HCT: 29.9 % — ABNORMAL LOW (ref 39.0–52.0)
Hemoglobin: 9.4 g/dL — ABNORMAL LOW (ref 13.0–17.0)
MCH: 29.6 pg (ref 26.0–34.0)
MCHC: 31.4 g/dL (ref 30.0–36.0)
MCV: 94 fL (ref 80.0–100.0)
Platelets: 142 K/uL — ABNORMAL LOW (ref 150–400)
RBC: 3.18 MIL/uL — ABNORMAL LOW (ref 4.22–5.81)
RDW: 13 % (ref 11.5–15.5)
WBC: 7.1 K/uL (ref 4.0–10.5)
nRBC: 0 % (ref 0.0–0.2)

## 2024-04-26 LAB — RENAL FUNCTION PANEL
Albumin: 1.9 g/dL — ABNORMAL LOW (ref 3.5–5.0)
Anion gap: 6 (ref 5–15)
BUN: 60 mg/dL — ABNORMAL HIGH (ref 8–23)
CO2: 31 mmol/L (ref 22–32)
Calcium: 7.1 mg/dL — ABNORMAL LOW (ref 8.9–10.3)
Chloride: 105 mmol/L (ref 98–111)
Creatinine, Ser: 3.45 mg/dL — ABNORMAL HIGH (ref 0.61–1.24)
GFR, Estimated: 19 mL/min — ABNORMAL LOW (ref >60)
Glucose, Bld: 188 mg/dL — ABNORMAL HIGH (ref 70–99)
Phosphorus: 4.9 mg/dL — ABNORMAL HIGH (ref 2.5–4.6)
Potassium: 3.3 mmol/L — ABNORMAL LOW (ref 3.5–5.1)
Sodium: 142 mmol/L (ref 135–145)

## 2024-04-26 LAB — GLUCOSE, CAPILLARY
Glucose-Capillary: 180 mg/dL — ABNORMAL HIGH (ref 70–99)
Glucose-Capillary: 214 mg/dL — ABNORMAL HIGH (ref 70–99)
Glucose-Capillary: 221 mg/dL — ABNORMAL HIGH (ref 70–99)
Glucose-Capillary: 255 mg/dL — ABNORMAL HIGH (ref 70–99)

## 2024-04-26 MED ORDER — DEXTROSE 5 % IV SOLN: INTRAVENOUS | Status: AC

## 2024-04-26 MED ORDER — NEPRO/CARBSTEADY PO LIQD
237.0000 mL | Freq: Three times a day (TID) | ORAL | Status: DC
  Administered 2024-04-26 – 2024-05-01 (×7): 237 mL via ORAL

## 2024-04-26 MED ORDER — POTASSIUM CHLORIDE 20 MEQ PO PACK
40.0000 meq | PACK | Freq: Once | ORAL | Status: AC
  Administered 2024-04-26: 40 meq via ORAL
  Filled 2024-04-26: qty 2

## 2024-04-26 MED ORDER — SALINE SPRAY 0.65 % NA SOLN
1.0000 | NASAL | Status: DC | PRN
  Filled 2024-04-26: qty 44

## 2024-04-26 NOTE — Plan of Care (Signed)

## 2024-04-26 NOTE — Progress Notes (Signed)
 [facility]  ROUNDING NOTE   Subjective:  Patient seen during rounding.  No acute events overnight.  No changes since previous visit.  No complaints to offer.  Continuing IV hydration   Objective:  Vital signs in last 24 hours:  Temp:  [97.6 F (36.4 C)-98.8 F (37.1 C)] 98.3 F (36.8 C) (08/03 1153) Pulse Rate:  [69-84] 69 (08/03 1153) Resp:  [16-20] 16 (08/03 1153) BP: (114-153)/(64-70) 114/66 (08/03 1153) SpO2:  [85 %-97 %] 89 % (08/03 1153)  Weight change:  Filed Weights   04/14/24 0638 04/14/24 2358  Weight: 117.1 kg 117 kg    Intake/Output: I/O last 3 completed shifts: In: 4340.6 [P.O.:1560; I.V.:2630.6; NG/GT:150] Out: 2500 [Urine:1900; Emesis/NG output:600]   Intake/Output this shift:  Total I/O In: 720 [P.O.:720] Out: -   Physical Exam: General: NAD  Head: Normocephalic  Eyes: Anicteric  Neck: Supple  Lungs:  2L [place]  Heart: Regular rate   Abdomen:  Distended and firm  Extremities: Trace peripheral edema.  Neurologic: Awake and alert  Skin: No rashes  Access: None    Basic Metabolic Panel: Recent Labs  Lab 04/21/24 0530 04/21/24 1430 04/22/24 0415 04/23/24 0905 04/24/24 0809 04/25/24 0644 04/26/24 0630  NA 138   < > 139 145 148* 150* 142  K 5.6*   < > 4.7 4.3 4.1 3.8 3.3*  CL 109   < > 107 111 109 112* 105  CO2 21*   < > 23 23 26 28 31   GLUCOSE 340*   < > 276* 219* 202* 206* 188*  BUN 74*   < > 81* 76* 69* 65* 60*  CREATININE 3.96*   < > 3.89* 3.74* 3.64* 3.46* 3.45*  CALCIUM  7.6*   < > 7.8* 7.8* 7.6* 7.6* 7.1*  MG 1.8  --  1.9 1.7 1.6* 2.1  --   PHOS 5.9*  --   --  5.3* 5.2* 5.1* 4.9*   < > = values in this interval not displayed.    Liver Function Tests: Recent Labs  Lab 04/21/24 0530 04/24/24 0809 04/25/24 0644 04/26/24 0630  AST 17  --  33  --   ALT 14  --  23  --   ALKPHOS 51  --  43  --   BILITOT 0.3  --  0.5  --   PROT 6.7  --  5.2*  --   ALBUMIN  2.1* 2.2* 2.0* 1.9*   No results for input(s): LIPASE,  AMYLASE in the last 168 hours. No results for input(s): AMMONIA in the last 168 hours.  CBC: Recent Labs  Lab 04/21/24 0530 04/22/24 0415 04/23/24 0905 04/24/24 0809 04/26/24 0630  WBC 8.7 16.1* 11.1* 9.8 7.1  NEUTROABS  --   --  9.2* 7.9*  --   HGB 9.9* 11.2* 11.5* 11.4* 9.4*  HCT 31.5* 34.5* 36.8* 35.7* 29.9*  MCV 92.6 90.8 92.9 92.7 94.0  PLT 210 269 225 208 142*    Cardiac Enzymes: No results for input(s): CKTOTAL, CKMB, CKMBINDEX, TROPONINI in the last 168 hours.  BNP: Invalid input(s): POCBNP  CBG: Recent Labs  Lab 04/25/24 1128 04/25/24 1617 04/25/24 2152 04/26/24 0854 04/26/24 1208  GLUCAP 198* 230* 222* 180* 214*    Microbiology: Results for orders placed or performed during the hospital encounter of 04/14/24  Urine Culture     Status: Abnormal   Collection Time: 04/14/24  6:43 AM   Specimen: Urine, Random  Result Value Ref Range Status   Specimen Description  Final    URINE, RANDOM Performed at [facility], [address] [place], [place] [address]    Special Requests   Final    NONE Reflexed from [id number] Performed at [facility], [address] [place], [place] [address]    Culture (A)  Final    >=100,000 COLONIES/mL PROTEUS MIRABILIS 10,000 COLONIES/mL STAPHYLOCOCCUS AUREUS    Report Status 04/17/2024 FINAL  Final   Organism ID, Bacteria PROTEUS MIRABILIS (A)  Final   Organism ID, Bacteria STAPHYLOCOCCUS AUREUS (A)  Final      Susceptibility   Proteus mirabilis - MIC*    AMPICILLIN <=2 SENSITIVE Sensitive     CEFAZOLIN  <=4 SENSITIVE Sensitive     CEFEPIME  <=0.12 SENSITIVE Sensitive     CEFTRIAXONE  <=0.25 SENSITIVE Sensitive     CIPROFLOXACIN  >=4 RESISTANT Resistant     GENTAMICIN <=1 SENSITIVE Sensitive     IMIPENEM 2 SENSITIVE Sensitive     NITROFURANTOIN RESISTANT Resistant     TRIMETH /SULFA  <=20 SENSITIVE Sensitive     AMPICILLIN/SULBACTAM <=2 SENSITIVE Sensitive     PIP/TAZO <=4 SENSITIVE  Sensitive ug/mL    * >=100,000 COLONIES/mL PROTEUS MIRABILIS   Staphylococcus aureus - MIC*    CIPROFLOXACIN  >=8 RESISTANT Resistant     GENTAMICIN <=0.5 SENSITIVE Sensitive     NITROFURANTOIN <=16 SENSITIVE Sensitive     OXACILLIN <=0.25 SENSITIVE Sensitive     TETRACYCLINE <=1 SENSITIVE Sensitive     VANCOMYCIN  1 SENSITIVE Sensitive     TRIMETH /SULFA  <=10 SENSITIVE Sensitive     RIFAMPIN  <=0.5 SENSITIVE Sensitive     Inducible Clindamycin NEGATIVE Sensitive     LINEZOLID  2 SENSITIVE Sensitive     * 10,000 COLONIES/mL STAPHYLOCOCCUS AUREUS    Coagulation Studies: No results for input(s): LABPROT, INR in the last 72 hours.  Urinalysis: No results for input(s): COLORURINE, LABSPEC, PHURINE, GLUCOSEU, HGBUR, BILIRUBINUR, KETONESUR, PROTEINUR, UROBILINOGEN, NITRITE, LEUKOCYTESUR in the last 72 hours.  Invalid input(s): APPERANCEUR    Imaging: No results found.   Medications:    cefTRIAXone  (ROCEPHIN )  IV 2 g (04/25/24 1500)   dextrose  100 mL/hr at 04/26/24 0305   promethazine  (PHENERGAN ) injection (IM or IVPB) 12.5 mg (04/24/24 0918)    amLODipine   10 mg Per NG tube Daily   budesonide  (PULMICORT ) nebulizer solution  0.25 mg Nebulization BID   Chlorhexidine  Gluconate Cloth  6 each Topical Q0600   diatrizoate  meglumine -sodium  90 mL Per NG tube Once   enoxaparin  (LOVENOX ) injection  30 mg Subcutaneous Q24H   gabapentin   300 mg Per Tube QHS   insulin  aspart  0-5 Units Subcutaneous QHS   insulin  aspart  0-9 Units Subcutaneous TID WC   insulin  glargine-yfgn  10 Units Subcutaneous Q24H   levothyroxine   175 mcg Per NG tube Q0600   linaclotide   145 mcg Per Tube QAC breakfast   melatonin  10 mg Per Tube QHS   metoprolol  tartrate  12.5 mg Per Tube BID   nicotine   21 mg Transdermal Daily   polyethylene glycol  17 g Per NG tube Daily   senna-docusate  1 tablet Per NG tube BID   simethicone   80 mg Per NG tube QID   bisacodyl , hydrALAZINE ,  hydrocortisone  cream, ipratropium-albuterol , morphine  injection, ondansetron  (ZOFRAN ) IV, oxyCODONE , promethazine  (PHENERGAN ) injection (IM or IVPB), sodium chloride , sodium phosphate , traZODone   Assessment/ Plan:  [name] is a 63 y.o.  male  with past medical conditions including diabetes, bilateral BKA, suprapubic catheter, hepatitis C, who was admitted to  [facility] on 04/14/2024 for Suprapubic catheter (HCC) [Z93.59] AKI (acute kidney injury) (HCC) [N17.9] Acute kidney injury (HCC) [N17.9]    Acute kidney injury with proteinuria without recent baseline. Creatinine 4.23 on admission. Acute kidney injury likely secondary to dehydration and ileus. Continuing IV fluids, D5 at 100 ml/hr for hydration. No acute indication for dialysis. Follow-up protein creatinine ratio and MMP. Will continue to monitor renal indices. Lab Results  Component Value Date   CREATININE 3.45 (H) 04/26/2024   CREATININE 3.46 (H) 04/25/2024   CREATININE 3.64 (H) 04/24/2024     Intake/Output Summary (Last 24 hours) at 04/26/2024 1241 Last data filed at 04/26/2024 1043 Gross per 24 hour  Intake 3731.49 ml  Output 900 ml  Net 2831.49 ml       2.  Hypertension, essential.  Currently prescribed amlodipine  and metoprolol .  BP 114/66 today.   3. Hypernatremia S Sodium 142 improving Likely in the setting of free water losses. Continuing D5 at 100ml/hr   4.  Hypokalemia 3.3 today. Replaced by primary team  5.  Hypoalbuminemia 1.9 04/26/2024  Encourage protein intake, give nepro with meals   LOS: 11 Lashayla Armes P Syrai Gladwin 8/3/202512:39 PM

## 2024-04-26 NOTE — Plan of Care (Signed)

## 2024-04-26 NOTE — Progress Notes (Addendum)
 Progress Note    Eugene [name]  FMW:[id number] DOB: Aug 29, 1961  DOA: 04/14/2024 PCP: [facility], [facility]      Brief Narrative:    Medical records reviewed and are as summarized below:  Eugene [name] is a 63 y.o. male with medical history significant for hepatitis C, osteomyelitis, spinal stenosis, IBS, diabetes mellitus, depression, anxiety, history of BKA, neurogenic bladder with chronic suprapubic catheter, recurrent fecal impaction, history of bowel obstruction requiring NG tube decompression, who was sent from SNF because of constipation.  Reportedly, he had not moved his bowels for about 6 days prior to admission.  He also had an episode of vomiting.  He was admitted to the hospital for constipation AKI.       Assessment/Plan:   Principal Problem:   AKI (acute kidney injury) (HCC) Active Problems:   Catheter-associated urinary tract infection (HCC)   Hyperkalemia   Suprapubic catheter (HCC)   Acute kidney injury (HCC)   Acid-base disorder, mixed   Acute hypoxic respiratory failure (HCC)   Ileus (HCC)    Body mass index is 50.38 kg/m.  (Morbid obesity)   Hypernatremia: Resolved.  Oral sodium bicarbonate  was discontinued on 04/25/2024.  AKI: Creatinine down from 3.74-3.64-3.46-3.45.  Urine protein creatinine ratio was 15.41. Suspect underlying CKD.  Will discontinue IV fluids to avoid fluid overload.  Follow-up with nephrologist. Creatinine was 4.23 on admission.  Last creatinine on record was 0.84 in July 2022.    Acute hypoxic respiratory failure: Oxygen has been weaned down to 2 L/min.  Continue weaning attempts.   He required up to 6 L of oxygen via HFNC.  VQ scan was low probability for PE. BNP 199.7 2D echo on 04/19/2024 showed EF estimated at 55 to 60%, indeterminate LV diastolic parameters, no significant valvular abnormality detected but overall poor windows for evaluation of LV function by transthoracic echocardiography.   Adynamic ileus,  gastroparesis, intractable nausea and vomiting, chronic constipation: NG tube was placed on 04/23/2024 for gastric decompression.  NG tube came out earlier this morning, 04/26/2024 (probably accidental removal).  Start regular diet.   He was evaluated by general surgeon on 04/22/2024.  No bowel obstruction and surgeon has signed off.   Probable acute complicated UTI in the setting of underlying chronic suprapubic catheter: He has been on antibiotics since 04/14/2024.   IV ceftriaxone  was discontinued on 04/24/2024   Probable bilateral lower lobe pneumonia on CT chest: Completed antibiotics on 04/24/2024    Type II DM: Continue glargine 10 units daily.NovoLog  as needed for hyperglycemia   Possible acute exacerbation of chronic HFpEF: Appears compensated for now. 2D echo was unremarkable.   Chronic opioid use: Continue oxycodone  as needed.   Hypomagnesemia: Improved Hypokalemia: Replace potassium and monitor level   Leukocytosis: Improved.  Probably recent prednisone  on 04/20/2024 contributed leukocytosis.   Vitamin D  deficiency: Vitamin D  level was 7.92.  Start high-dose vitamin D  supplement when vomiting improves  Iron  studies showed iron  studies showed iron  level 58, saturation ratio 35, TIBC 168, ferritin 90 Vitamin B12 level is 240   Diet Order             Diet Carb Modified Fluid consistency: Thin  Diet effective now                            Consultants: General Surgeon Nephrologist  Procedures: None    Medications:    amLODipine   10 mg Per NG tube Daily  budesonide  (PULMICORT ) nebulizer solution  0.25 mg Nebulization BID   Chlorhexidine  Gluconate Cloth  6 each Topical Q0600   diatrizoate  meglumine -sodium  90 mL Per NG tube Once   enoxaparin  (LOVENOX ) injection  30 mg Subcutaneous Q24H   feeding supplement (NEPRO CARB STEADY)  237 mL Oral TID BM   gabapentin   300 mg Per Tube QHS   insulin  aspart  0-5 Units Subcutaneous QHS   insulin  aspart  0-9  Units Subcutaneous TID WC   insulin  glargine-yfgn  10 Units Subcutaneous Q24H   levothyroxine   175 mcg Per NG tube Q0600   linaclotide   145 mcg Per Tube QAC breakfast   melatonin  10 mg Per Tube QHS   metoprolol  tartrate  12.5 mg Per Tube BID   nicotine   21 mg Transdermal Daily   polyethylene glycol  17 g Per NG tube Daily   senna-docusate  1 tablet Per NG tube BID   simethicone   80 mg Per NG tube QID   Continuous Infusions:  cefTRIAXone  (ROCEPHIN )  IV 2 g (04/25/24 1500)   dextrose  100 mL/hr at 04/26/24 1300   promethazine  (PHENERGAN ) injection (IM or IVPB) 12.5 mg (04/24/24 0918)     Anti-infectives (From admission, onward)    Start     Dose/Rate Route Frequency Ordered Stop   04/22/24 1600  cefTRIAXone  (ROCEPHIN ) 2 g in sodium chloride  0.9 % 100 mL IVPB        2 g 200 mL/hr over 30 Minutes Intravenous Every 24 hours 04/22/24 1417     04/17/24 2200  cephALEXin  (KEFLEX ) capsule 500 mg  Status:  Discontinued        500 mg Oral Every 8 hours 04/17/24 1332 04/22/24 1417   04/16/24 1145  cefTRIAXone  (ROCEPHIN ) 1 g in sodium chloride  0.9 % 100 mL IVPB  Status:  Discontinued        1 g 200 mL/hr over 30 Minutes Intravenous Every 24 hours 04/16/24 1059 04/17/24 1332   04/14/24 0930  cefTRIAXone  (ROCEPHIN ) 2 g in sodium chloride  0.9 % 100 mL IVPB  Status:  Discontinued        2 g 200 mL/hr over 30 Minutes Intravenous  Once 04/14/24 9071 04/14/24 0946              Family Communication/Anticipated D/C date and plan/Code Status   DVT prophylaxis: enoxaparin  (LOVENOX ) injection 30 mg Start: 04/21/24 1100     Code Status: Do not attempt resuscitation (DNR) PRE-ARREST INTERVENTIONS DESIRED  Family Communication: None Disposition Plan: Plan to discharge to SNF   Status is: Inpatient Remains inpatient appropriate because: Intractable nausea and vomiting       Subjective:   Interval events noted.  He feels better.  No nausea or vomiting.  He said NG tube came out.  It  appears this was accidental.  He's still constipated  Objective:    Vitals:   04/25/24 2329 04/26/24 0216 04/26/24 0835 04/26/24 1153  BP: (!) 151/68 117/64 (!) 152/70 114/66  Pulse: 77 71  69  Resp: 18 20 16 16   Temp: 98.7 F (37.1 C) 98.4 F (36.9 C) 98.5 F (36.9 C) 98.3 F (36.8 C)  TempSrc: Oral Oral    SpO2: 96% 97% 90% (!) 89%  Weight:      Height:       No data found.   Intake/Output Summary (Last 24 hours) at 04/26/2024 1437 Last data filed at 04/26/2024 1300 Gross per 24 hour  Intake 3180.03 ml  Output 900 ml  Net 2280.03 ml   Filed Weights   04/14/24 0638 04/14/24 2358  Weight: 117.1 kg 117 kg    Exam:    GEN: NAD SKIN: Warm and dry EYES: No pallor or icterus ENT: MMM CV: RRR PULM: CTA B ABD: soft, ND, NT, +BS CNS: AAO x 3, non focal EXT: No edema or tenderness. B/l BKA       Data Reviewed:   I have personally reviewed following labs and imaging studies:  Labs: Labs show the following:   Basic Metabolic Panel: Recent Labs  Lab 04/21/24 0530 04/21/24 1430 04/22/24 0415 04/23/24 0905 04/24/24 0809 04/25/24 0644 04/26/24 0630  NA 138   < > 139 145 148* 150* 142  K 5.6*   < > 4.7 4.3 4.1 3.8 3.3*  CL 109   < > 107 111 109 112* 105  CO2 21*   < > 23 23 26 28 31   GLUCOSE 340*   < > 276* 219* 202* 206* 188*  BUN 74*   < > 81* 76* 69* 65* 60*  CREATININE 3.96*   < > 3.89* 3.74* 3.64* 3.46* 3.45*  CALCIUM  7.6*   < > 7.8* 7.8* 7.6* 7.6* 7.1*  MG 1.8  --  1.9 1.7 1.6* 2.1  --   PHOS 5.9*  --   --  5.3* 5.2* 5.1* 4.9*   < > = values in this interval not displayed.   GFR Estimated Creatinine Clearance: 24.4 mL/min (A) (by C-G formula based on SCr of 3.45 mg/dL (H)). Liver Function Tests: Recent Labs  Lab 04/21/24 0530 04/24/24 0809 04/25/24 0644 04/26/24 0630  AST 17  --  33  --   ALT 14  --  23  --   ALKPHOS 51  --  43  --   BILITOT 0.3  --  0.5  --   PROT 6.7  --  5.2*  --   ALBUMIN  2.1* 2.2* 2.0* 1.9*   No results for  input(s): LIPASE, AMYLASE in the last 168 hours. No results for input(s): AMMONIA in the last 168 hours. Coagulation profile Recent Labs  Lab 04/20/24 0912  INR 1.1    CBC: Recent Labs  Lab 04/21/24 0530 04/22/24 0415 04/23/24 0905 04/24/24 0809 04/26/24 0630  WBC 8.7 16.1* 11.1* 9.8 7.1  NEUTROABS  --   --  9.2* 7.9*  --   HGB 9.9* 11.2* 11.5* 11.4* 9.4*  HCT 31.5* 34.5* 36.8* 35.7* 29.9*  MCV 92.6 90.8 92.9 92.7 94.0  PLT 210 269 225 208 142*   Cardiac Enzymes: No results for input(s): CKTOTAL, CKMB, CKMBINDEX, TROPONINI in the last 168 hours. BNP (last 3 results) No results for input(s): PROBNP in the last 8760 hours. CBG: Recent Labs  Lab 04/25/24 1128 04/25/24 1617 04/25/24 2152 04/26/24 0854 04/26/24 1208  GLUCAP 198* 230* 222* 180* 214*   D-Dimer: No results for input(s): DDIMER in the last 72 hours. Hgb A1c: No results for input(s): HGBA1C in the last 72 hours. Lipid Profile: No results for input(s): CHOL, HDL, LDLCALC, TRIG, CHOLHDL, LDLDIRECT in the last 72 hours. Thyroid function studies: No results for input(s): TSH, T4TOTAL, T3FREE, THYROIDAB in the last 72 hours.  Invalid input(s): FREET3 Anemia work up: Recent Labs    04/24/24 0809  VITAMINB12 240  FERRITIN 90  TIBC 168*  IRON  58   Sepsis Labs: Recent Labs  Lab 04/22/24 0415 04/23/24 0905 04/24/24 0809 04/26/24 0630  WBC 16.1* 11.1* 9.8 7.1    Microbiology No results found  for this or any previous visit (from the past 240 hours).   Procedures and diagnostic studies:  No results found.              LOS: 11 days   Avier Jech  [employer] loss adjuster on [email or web address]. If 7PM-7AM, please contact night-coverage at [email or web address]     04/26/2024, 2:37 PM

## 2024-04-27 DIAGNOSIS — N179 Acute kidney failure, unspecified: Secondary | ICD-10-CM | POA: Diagnosis not present

## 2024-04-27 LAB — RENAL FUNCTION PANEL
Albumin: 2 g/dL — ABNORMAL LOW (ref 3.5–5.0)
Anion gap: 14 (ref 5–15)
BUN: 59 mg/dL — ABNORMAL HIGH (ref 8–23)
CO2: 25 mmol/L (ref 22–32)
Calcium: 7.1 mg/dL — ABNORMAL LOW (ref 8.9–10.3)
Chloride: 101 mmol/L (ref 98–111)
Creatinine, Ser: 3.97 mg/dL — ABNORMAL HIGH (ref 0.61–1.24)
GFR, Estimated: 16 mL/min — ABNORMAL LOW (ref >60)
Glucose, Bld: 233 mg/dL — ABNORMAL HIGH (ref 70–99)
Phosphorus: 4.3 mg/dL (ref 2.5–4.6)
Potassium: 3.8 mmol/L (ref 3.5–5.1)
Sodium: 140 mmol/L (ref 135–145)

## 2024-04-27 LAB — GLUCOSE, CAPILLARY
Glucose-Capillary: 196 mg/dL — ABNORMAL HIGH (ref 70–99)
Glucose-Capillary: 169 mg/dL — ABNORMAL HIGH (ref 70–99)
Glucose-Capillary: 131 mg/dL — ABNORMAL HIGH (ref 70–99)
Glucose-Capillary: 99 mg/dL (ref 70–99)

## 2024-04-27 LAB — CBC
HCT: 34.2 % — ABNORMAL LOW (ref 39.0–52.0)
Hemoglobin: 11.2 g/dL — ABNORMAL LOW (ref 13.0–17.0)
MCH: 29.6 pg (ref 26.0–34.0)
MCHC: 32.7 g/dL (ref 30.0–36.0)
MCV: 90.5 fL (ref 80.0–100.0)
Platelets: 165 K/uL (ref 150–400)
RBC: 3.78 MIL/uL — ABNORMAL LOW (ref 4.22–5.81)
RDW: 12.6 % (ref 11.5–15.5)
WBC: 10.1 K/uL (ref 4.0–10.5)
nRBC: 0 % (ref 0.0–0.2)

## 2024-04-27 MED ORDER — BISACODYL 10 MG RE SUPP
10.0000 mg | Freq: Once | RECTAL | Status: AC
  Administered 2024-04-27: 10 mg via RECTAL
  Filled 2024-04-27: qty 1

## 2024-04-27 MED ORDER — SODIUM CHLORIDE 0.9 % IV SOLN
500.0000 mg | Freq: Four times a day (QID) | INTRAVENOUS | Status: AC
  Administered 2024-04-27 (×2): 500 mg via INTRAVENOUS
  Filled 2024-04-27 (×2): qty 10

## 2024-04-27 MED ORDER — METOCLOPRAMIDE HCL 5 MG/ML IJ SOLN
10.0000 mg | Freq: Once | INTRAMUSCULAR | Status: AC
  Administered 2024-04-27: 10 mg via INTRAVENOUS
  Filled 2024-04-27: qty 2

## 2024-04-27 NOTE — Progress Notes (Signed)
 [facility]  ROUNDING NOTE   Subjective:  Patient seen during rounding.  No acute events overnight.  No changes since previous visit.   States he had nausea and vomiting overnight. Foley in place with a full bag.  Objective:  Vital signs in last 24 hours:  Temp:  [98.1 F (36.7 C)-98.7 F (37.1 C)] 98.4 F (36.9 C) (08/04 0412) Pulse Rate:  [69-85] 83 (08/04 0834) Resp:  [16-20] 16 (08/04 0834) BP: (114-162)/(60-84) 162/76 (08/04 0834) SpO2:  [89 %-94 %] 94 % (08/04 0834)  Weight change:  Filed Weights   04/14/24 0638 04/14/24 2358  Weight: 117.1 kg 117 kg    Intake/Output: I/O last 3 completed shifts: In: 4516.2 [P.O.:2160; I.V.:2189.8; IV Piggyback:166.4] Out: 400 [Urine:400]   Intake/Output this shift:  Total I/O In: -  Out: 850 [Urine:850]  Physical Exam: General: NAD  Head: Normocephalic  Eyes: Anicteric  Neck: Supple  Lungs:  2L [place]  Heart: Regular rate   Abdomen:  Distended and firm  Extremities: Trace peripheral edema.,  Bilateral BKA  Neurologic: Awake and alert  Skin: No rashes  Access: None  Foley in place  Basic Metabolic Panel: Recent Labs  Lab 04/21/24 0530 04/21/24 1430 04/22/24 0415 04/23/24 0905 04/24/24 0809 04/25/24 0644 04/26/24 0630 04/27/24 0640  NA 138   < > 139 145 148* 150* 142 140  K 5.6*   < > 4.7 4.3 4.1 3.8 3.3* 3.8  CL 109   < > 107 111 109 112* 105 101  CO2 21*   < > 23 23 26 28 31 25   GLUCOSE 340*   < > 276* 219* 202* 206* 188* 233*  BUN 74*   < > 81* 76* 69* 65* 60* 59*  CREATININE 3.96*   < > 3.89* 3.74* 3.64* 3.46* 3.45* 3.97*  CALCIUM  7.6*   < > 7.8* 7.8* 7.6* 7.6* 7.1* 7.1*  MG 1.8  --  1.9 1.7 1.6* 2.1  --   --   PHOS 5.9*  --   --  5.3* 5.2* 5.1* 4.9* 4.3   < > = values in this interval not displayed.    Liver Function Tests: Recent Labs  Lab 04/21/24 0530 04/24/24 0809 04/25/24 0644 04/26/24 0630 04/27/24 0640  AST 17  --  33  --   --   ALT 14  --  23  --   --   ALKPHOS 51  --  43   --   --   BILITOT 0.3  --  0.5  --   --   PROT 6.7  --  5.2*  --   --   ALBUMIN  2.1* 2.2* 2.0* 1.9* 2.0*   No results for input(s): LIPASE, AMYLASE in the last 168 hours. No results for input(s): AMMONIA in the last 168 hours.  CBC: Recent Labs  Lab 04/22/24 0415 04/23/24 0905 04/24/24 0809 04/26/24 0630 04/27/24 0640  WBC 16.1* 11.1* 9.8 7.1 10.1  NEUTROABS  --  9.2* 7.9*  --   --   HGB 11.2* 11.5* 11.4* 9.4* 11.2*  HCT 34.5* 36.8* 35.7* 29.9* 34.2*  MCV 90.8 92.9 92.7 94.0 90.5  PLT 269 225 208 142* 165    Cardiac Enzymes: No results for input(s): CKTOTAL, CKMB, CKMBINDEX, TROPONINI in the last 168 hours.  BNP: Invalid input(s): POCBNP  CBG: Recent Labs  Lab 04/26/24 0854 04/26/24 1208 04/26/24 1640 04/26/24 2002 04/27/24 0902  GLUCAP 180* 214* 221* 255* 196*    Microbiology: Results for orders placed  or performed during the hospital encounter of 04/14/24  Urine Culture     Status: Abnormal   Collection Time: 04/14/24  6:43 AM   Specimen: Urine, Random  Result Value Ref Range Status   Specimen Description   Final    URINE, RANDOM Performed at [facility], [address] [place], [place] [address]    Special Requests   Final    NONE Reflexed from [id number] Performed at [facility], [address] [place], [place] [address]    Culture (A)  Final    >=100,000 COLONIES/mL PROTEUS MIRABILIS 10,000 COLONIES/mL STAPHYLOCOCCUS AUREUS    Report Status 04/17/2024 FINAL  Final   Organism ID, Bacteria PROTEUS MIRABILIS (A)  Final   Organism ID, Bacteria STAPHYLOCOCCUS AUREUS (A)  Final      Susceptibility   Proteus mirabilis - MIC*    AMPICILLIN <=2 SENSITIVE Sensitive     CEFAZOLIN  <=4 SENSITIVE Sensitive     CEFEPIME  <=0.12 SENSITIVE Sensitive     CEFTRIAXONE  <=0.25 SENSITIVE Sensitive     CIPROFLOXACIN  >=4 RESISTANT Resistant     GENTAMICIN <=1 SENSITIVE Sensitive     IMIPENEM 2 SENSITIVE Sensitive     NITROFURANTOIN  RESISTANT Resistant     TRIMETH /SULFA  <=20 SENSITIVE Sensitive     AMPICILLIN/SULBACTAM <=2 SENSITIVE Sensitive     PIP/TAZO <=4 SENSITIVE Sensitive ug/mL    * >=100,000 COLONIES/mL PROTEUS MIRABILIS   Staphylococcus aureus - MIC*    CIPROFLOXACIN  >=8 RESISTANT Resistant     GENTAMICIN <=0.5 SENSITIVE Sensitive     NITROFURANTOIN <=16 SENSITIVE Sensitive     OXACILLIN <=0.25 SENSITIVE Sensitive     TETRACYCLINE <=1 SENSITIVE Sensitive     VANCOMYCIN  1 SENSITIVE Sensitive     TRIMETH /SULFA  <=10 SENSITIVE Sensitive     RIFAMPIN  <=0.5 SENSITIVE Sensitive     Inducible Clindamycin NEGATIVE Sensitive     LINEZOLID  2 SENSITIVE Sensitive     * 10,000 COLONIES/mL STAPHYLOCOCCUS AUREUS    Coagulation Studies: No results for input(s): LABPROT, INR in the last 72 hours.  Urinalysis: No results for input(s): COLORURINE, LABSPEC, PHURINE, GLUCOSEU, HGBUR, BILIRUBINUR, KETONESUR, PROTEINUR, UROBILINOGEN, NITRITE, LEUKOCYTESUR in the last 72 hours.  Invalid input(s): APPERANCEUR    Imaging: No results found.   Medications:    cefTRIAXone  (ROCEPHIN )  IV 2 g (04/26/24 1708)   erythromycin      promethazine  (PHENERGAN ) injection (IM or IVPB) 12.5 mg (04/26/24 2314)    amLODipine   10 mg Per NG tube Daily   Chlorhexidine  Gluconate Cloth  6 each Topical Q0600   diatrizoate  meglumine -sodium  90 mL Per NG tube Once   enoxaparin  (LOVENOX ) injection  30 mg Subcutaneous Q24H   feeding supplement (NEPRO CARB STEADY)  237 mL Oral TID BM   gabapentin   300 mg Per Tube QHS   insulin  aspart  0-5 Units Subcutaneous QHS   insulin  aspart  0-9 Units Subcutaneous TID WC   insulin  glargine-yfgn  10 Units Subcutaneous Q24H   levothyroxine   175 mcg Per NG tube Q0600   linaclotide   145 mcg Per Tube QAC breakfast   melatonin  10 mg Per Tube QHS   metoCLOPramide  (REGLAN ) injection  10 mg Intravenous Once   metoprolol  tartrate  12.5 mg Per Tube BID   nicotine   21 mg Transdermal  Daily   polyethylene glycol  17 g Per NG tube Daily   senna-docusate  1 tablet Per NG tube BID   simethicone   80 mg Per NG tube QID   hydrALAZINE ,  hydrocortisone  cream, ipratropium-albuterol , morphine  injection, ondansetron  (ZOFRAN ) IV, oxyCODONE , promethazine  (PHENERGAN ) injection (IM or IVPB), sodium chloride , sodium phosphate , traZODone   Assessment/ Plan:  [name] is a 63 y.o.  male  with past medical conditions including diabetes, bilateral BKA, suprapubic catheter, hepatitis C, who was admitted to [facility] on 04/14/2024 for Suprapubic catheter (HCC) [Z93.59] AKI (acute kidney injury) (HCC) [N17.9] Acute kidney injury (HCC) [N17.9]    Acute kidney injury with proteinuria without recent baseline. Creatinine 4.23 on admission. Acute kidney injury likely secondary to dehydration and ileus. No acute indication for dialysis.  Good urine output is noted in the Foley bag.  Serum creatinine is also improving slowly.  Lab Results  Component Value Date   CREATININE 3.97 (H) 04/27/2024   CREATININE 3.45 (H) 04/26/2024   CREATININE 3.46 (H) 04/25/2024     Intake/Output Summary (Last 24 hours) at 04/27/2024 1011 Last data filed at 04/27/2024 0923 Gross per 24 hour  Intake 2100.03 ml  Output 850 ml  Net 1250.03 ml       2.  Hypertension, essential.  Currently prescribed amlodipine  and metoprolol .  Blood pressure in the 160s.  Continue monitoring   3. Hypernatremia-serum sodium has improved to 140 today and is now in the normal range. Likely in the setting of free water losses.  Seems like he is able to take in more oral intake although reported nausea vomiting last night.  If that continues, may need to restart IV hydration.   4.  Hypokalemia Potassium is now in the normal range.  Replaced by primary team  5.  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  Encourage protein intake, give nepro with meals Urine protein to creatinine  ratio of 15.4 although this is a Foley specimen.  Proteinuria likely secondary to diabetic nephropathy.  Hemoglobin A1c of 6.0% from 04/14/2024.  Previously diabetes control was suboptimal.   LOS: 12 Brix Brearley 8/4/202510:11 AM

## 2024-04-27 NOTE — Plan of Care (Signed)
  Problem: Education: Goal: Ability to describe self-care measures that may prevent or decrease complications (Diabetes Survival Skills Education) will improve Outcome: Progressing Goal: Individualized Educational Video(s) Outcome: Progressing   Problem: Coping: Goal: Ability to adjust to condition or change in health will improve Outcome: Progressing   Problem: Fluid Volume: Goal: Ability to maintain a balanced intake and output will improve Outcome: Progressing   Problem: Health Behavior/Discharge Planning: Goal: Ability to identify and utilize available resources and services will improve Outcome: Progressing Goal: Ability to manage health-related needs will improve Outcome: Progressing   Problem: Metabolic: Goal: Ability to maintain appropriate glucose levels will improve Outcome: Progressing   Problem: Nutritional: Goal: Progress toward achieving an optimal weight will improve Outcome: Progressing   Problem: Skin Integrity: Goal: Risk for impaired skin integrity will decrease Outcome: Progressing   Problem: Tissue Perfusion: Goal: Adequacy of tissue perfusion will improve Outcome: Progressing   Problem: Education: Goal: Knowledge of General Education information will improve Description: Including pain rating scale, medication(s)/side effects and non-pharmacologic comfort measures Outcome: Progressing   Problem: Health Behavior/Discharge Planning: Goal: Ability to manage health-related needs will improve Outcome: Progressing   Problem: Clinical Measurements: Goal: Ability to maintain clinical measurements within normal limits will improve Outcome: Progressing Goal: Will remain free from infection Outcome: Progressing Goal: Diagnostic test results will improve Outcome: Progressing Goal: Respiratory complications will improve Outcome: Progressing Goal: Cardiovascular complication will be avoided Outcome: Progressing   Problem: Activity: Goal: Risk for  activity intolerance will decrease Outcome: Progressing   Problem: Coping: Goal: Level of anxiety will decrease Outcome: Progressing   Problem: Elimination: Goal: Will not experience complications related to bowel motility Outcome: Progressing Goal: Will not experience complications related to urinary retention Outcome: Progressing   Problem: Pain Managment: Goal: General experience of comfort will improve and/or be controlled Outcome: Progressing   Problem: Safety: Goal: Ability to remain free from injury will improve Outcome: Progressing   Problem: Skin Integrity: Goal: Risk for impaired skin integrity will decrease Outcome: Progressing   Problem: Nutritional: Goal: Maintenance of adequate nutrition will improve Outcome: Not Progressing   Problem: Nutrition: Goal: Adequate nutrition will be maintained Outcome: Not Progressing

## 2024-04-27 NOTE — TOC Initial Note (Signed)
 Transition of Care [facility]) - Initial/Assessment Note    Patient Details  Name: Eugene [name] MRN: [id number] Date of Birth: July 15, 1961  Transition of Care [facility]) CM/SW Contact:    Lauraine JAYSON Carpen, LCSW Phone Number: 04/27/2024, 3:47 PM  Clinical Narrative:   CSW continues to follow progress for return to [facility].               Expected Discharge Plan: Skilled Nursing Facility Barriers to Discharge: Continued Medical Work up   Patient Goals and CMS Choice            Expected Discharge Plan and Services       Living arrangements for the past 2 months: Skilled Nursing Facility                                      Prior Living Arrangements/Services Living arrangements for the past 2 months: Skilled Nursing Facility Lives with:: Facility Resident Patient language and need for interpreter reviewed:: Yes        Need for Family Participation in Patient Care: Yes (Comment) Care giver support system in place?: Yes (comment)   Criminal Activity/Legal Involvement Pertinent to Current Situation/Hospitalization: No - Comment as needed  Activities of Daily Living   ADL Screening (condition at time of admission) Independently performs ADLs?: No Does the patient have a NEW difficulty with bathing/dressing/toileting/self-feeding that is expected to last >3 days?: No Does the patient have a NEW difficulty with getting in/out of bed, walking, or climbing stairs that is expected to last >3 days?: No Does the patient have a NEW difficulty with communication that is expected to last >3 days?: No Is the patient deaf or have difficulty hearing?: No Does the patient have difficulty seeing, even when wearing glasses/contacts?: No Does the patient have difficulty concentrating, remembering, or making decisions?: No  Permission Sought/Granted                  Emotional Assessment       Orientation: : Oriented to Self, Oriented to Place, Oriented to  Time,  Oriented to Situation Alcohol / Substance Use: Not Applicable Psych Involvement: No (comment)  Admission diagnosis:  Suprapubic catheter (HCC) [Z93.59] AKI (acute kidney injury) (HCC) [N17.9] Acute kidney injury (HCC) [N17.9] Patient Active Problem List   Diagnosis Date Noted   Ileus (HCC) 04/23/2024   Acute hypoxic respiratory failure (HCC) 04/19/2024   Acid-base disorder, mixed 04/16/2024   Acute kidney injury (HCC) 04/15/2024   Diabetic gastroparesis (HCC) 04/17/2021   Suprapubic catheter dysfunction (HCC)    Pain    Constipation by delayed colonic transit    Nausea and vomiting 10/21/2019   Suprapubic catheter (HCC)    Sepsis (HCC) 10/02/2019   Thoracic spinal stenosis 02/12/2019   Osteomyelitis of thoracic spine (HCC) 02/08/2019   Peripheral vascular disease (HCC) 02/08/2019   HTN (hypertension) 02/08/2019   Dyslipidemia 02/08/2019   Chronic pain syndrome 02/08/2019   Polysubstance abuse (HCC) 02/08/2019   Chronic hepatitis C without hepatic coma (HCC) 02/08/2019   GERD (gastroesophageal reflux disease) 02/08/2019   Cigarette smoker 02/08/2019   Normocytic anemia 02/08/2019   Catheter-associated urinary tract infection (HCC) 02/07/2019   AKI (acute kidney injury) (HCC) 02/07/2019   Hyperkalemia 02/07/2019   Pressure injury of skin 02/07/2019   Hyperlipidemia 11/07/2018   Drug-seeking behavior 11/07/2018   Abnormality of gait 10/17/2018   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  Abdominal pain 03/24/2018   Acute pain of left knee 01/23/2018   MRSA (methicillin resistant Staphylococcus aureus) septicemia (HCC) 08/17/2017   Bacteremia due to Staphylococcus aureus 08/15/2017   Other acute osteomyelitis, left ankle and foot (HCC) 06/29/2017   Diabetic ulcer of right foot associated with diabetes mellitus due to underlying condition (HCC) 06/27/2017   Cellulitis of left foot 06/27/2017   Tobacco use disorder 06/27/2017   Type 2 diabetes mellitus with hyperglycemia, with  long-term current use of insulin  (HCC) 06/18/2017   PCP:  Care, [facility]:   [address] [place], [place] - [address] [place] [address] Phone: [phone] Fax: [phone]     Social Drivers of Health (SDOH) Social History: SDOH Screenings   Food Insecurity: Food Insecurity Present (02/02/2019)  Transportation Needs: No Transportation Needs (02/02/2019)  Recent Concern: Transportation Needs - Unmet Transportation Needs (11/07/2018)   [facility] from [facility]  Financial Resource Strain: Medium Risk (02/02/2019)  Physical Activity: Unknown (02/02/2019)  Social Connections: Somewhat Isolated (02/02/2019)  Stress: Stress Concern Present (02/02/2019)  Tobacco Use: High Risk (04/15/2024)   SDOH Interventions:     Readmission Risk Interventions     No data to display

## 2024-04-27 NOTE — Progress Notes (Signed)
 Progress Note    Eugene [name]  FMW:[id number] DOB: 1961/01/10  DOA: 04/14/2024 PCP: Care, [facility]      Brief Narrative:    Medical records reviewed and are as summarized below:  Eugene [name] is a 63 y.o. male with medical history significant for hepatitis C, osteomyelitis, spinal stenosis, IBS, diabetes mellitus, depression, anxiety, history of BKA, neurogenic bladder with chronic suprapubic catheter, recurrent fecal impaction, history of bowel obstruction requiring NG tube decompression, who was sent from SNF because of constipation.  Reportedly, he had not moved his bowels for about 6 days prior to admission.  He also had an episode of vomiting.  He was admitted to the hospital for constipation AKI.       Assessment/Plan:   Principal Problem:   AKI (acute kidney injury) (HCC) Active Problems:   Catheter-associated urinary tract infection (HCC)   Hyperkalemia   Suprapubic catheter (HCC)   Acute kidney injury (HCC)   Acid-base disorder, mixed   Acute hypoxic respiratory failure (HCC)   Ileus (HCC)    Body mass index is 50.38 kg/m.  (Morbid obesity)   Hypernatremia: Resolved.  Oral sodium bicarbonate  was discontinued on 04/25/2024.  AKI: Creatinine up from 3.45-3.97.  Creatinine had initially gone down from 3.74-3.64-3.46-3.45.  Urine protein creatinine ratio was 15.41. Suspect underlying CKD.  Monitor kidney function off of IV fluids.  Encourage adequate oral intake. Follow-up with nephrologist. Creatinine was 4.23 on admission.  Last creatinine on record was 0.84 in July 2022.    Acute hypoxic respiratory failure: Oxygen has been weaned down to 2 L/min.  Continue weaning attempts.   He required up to 6 L of oxygen via HFNC.  VQ scan was low probability for PE. BNP 199.7 2D echo on 04/19/2024 showed EF estimated at 55 to 60%, indeterminate LV diastolic parameters, no significant valvular abnormality detected but overall poor windows for  evaluation of LV function by transthoracic echocardiography.   Adynamic ileus, gastroparesis, intractable nausea and vomiting, chronic constipation: NG tube was placed on 04/23/2024 for gastric decompression.  NG tube came out on the morning of 04/26/2024 (probably accidental removal).  He vomited last night and this morning.  He said that is because I ate too much.  Recommended keeping him n.p.o. but he insisted on a liquid diet and being able to drink Gatorade.  He said he will take it easy at this time. Start IV Reglan  and IV erythromycin  for gastroparesis.  He said he had a small one last night when he was asked about bowel movement.  He is already on MiraLAX  and Senokot. Dulcolax suppository has been ordered for constipation.  He was evaluated by general surgeon on 04/22/2024.  No bowel obstruction and surgeon has signed off.   Probable acute complicated UTI in the setting of underlying chronic suprapubic catheter: He has been on antibiotics since 04/14/2024.   IV ceftriaxone  was discontinued on 04/24/2024   Probable bilateral lower lobe pneumonia on CT chest: Completed antibiotics on 04/24/2024    Type II DM: Continue glargine 10 units daily.NovoLog  as needed for hyperglycemia   Possible acute exacerbation of chronic HFpEF: Appears compensated for now. 2D echo was unremarkable.   Chronic opioid use: Continue oxycodone  as needed.   Hypomagnesemia: Improved Hypokalemia: Improved   Leukocytosis: Improved.  Probably recent prednisone  on 04/20/2024 contributed leukocytosis.   Vitamin D  deficiency: Vitamin D  level was 7.92.  Start high-dose vitamin D  supplement when vomiting improves  Iron  studies showed iron  studies showed iron  level  58, saturation ratio 35, TIBC 168, ferritin 90 Vitamin B12 level is 240   Diet Order             Diet clear liquid Fluid consistency: Thin  Diet effective now                            Consultants: General  Surgeon Nephrologist  Procedures: None    Medications:    amLODipine   10 mg Per NG tube Daily   Chlorhexidine  Gluconate Cloth  6 each Topical Q0600   diatrizoate  meglumine -sodium  90 mL Per NG tube Once   enoxaparin  (LOVENOX ) injection  30 mg Subcutaneous Q24H   feeding supplement (NEPRO CARB STEADY)  237 mL Oral TID BM   gabapentin   300 mg Per Tube QHS   insulin  aspart  0-5 Units Subcutaneous QHS   insulin  aspart  0-9 Units Subcutaneous TID WC   insulin  glargine-yfgn  10 Units Subcutaneous Q24H   levothyroxine   175 mcg Per NG tube Q0600   linaclotide   145 mcg Per Tube QAC breakfast   melatonin  10 mg Per Tube QHS   metoprolol  tartrate  12.5 mg Per Tube BID   nicotine   21 mg Transdermal Daily   polyethylene glycol  17 g Per NG tube Daily   senna-docusate  1 tablet Per NG tube BID   simethicone   80 mg Per NG tube QID   Continuous Infusions:  cefTRIAXone  (ROCEPHIN )  IV 2 g (04/26/24 1708)   promethazine  (PHENERGAN ) injection (IM or IVPB) 12.5 mg (04/26/24 2314)     Anti-infectives (From admission, onward)    Start     Dose/Rate Route Frequency Ordered Stop   04/22/24 1600  cefTRIAXone  (ROCEPHIN ) 2 g in sodium chloride  0.9 % 100 mL IVPB        2 g 200 mL/hr over 30 Minutes Intravenous Every 24 hours 04/22/24 1417     04/17/24 2200  cephALEXin  (KEFLEX ) capsule 500 mg  Status:  Discontinued        500 mg Oral Every 8 hours 04/17/24 1332 04/22/24 1417   04/16/24 1145  cefTRIAXone  (ROCEPHIN ) 1 g in sodium chloride  0.9 % 100 mL IVPB  Status:  Discontinued        1 g 200 mL/hr over 30 Minutes Intravenous Every 24 hours 04/16/24 1059 04/17/24 1332   04/14/24 0930  cefTRIAXone  (ROCEPHIN ) 2 g in sodium chloride  0.9 % 100 mL IVPB  Status:  Discontinued        2 g 200 mL/hr over 30 Minutes Intravenous  Once 04/14/24 9071 04/14/24 0946              Family Communication/Anticipated D/C date and plan/Code Status   DVT prophylaxis: enoxaparin  (LOVENOX ) injection 30 mg  Start: 04/21/24 1100     Code Status: Do not attempt resuscitation (DNR) PRE-ARREST INTERVENTIONS DESIRED  Family Communication: None Disposition Plan: Plan to discharge to SNF   Status is: Inpatient Remains inpatient appropriate because: Intractable nausea and vomiting       Subjective:   He said he had a small bowel movement last night.  He vomited last night and this morning but he attributes this to I ate too much.  No abdominal pain.  He does not want to be NPO.  He wants to be able to drink Gatorade.   Objective:    Vitals:   04/26/24 2000 04/26/24 2344 04/27/24 0412 04/27/24 0834  BP: (!) 154/70 (!) 156/68 ROLLEN)  160/84 (!) 162/76  Pulse: 71 70 85 83  Resp: 18 18 20 16   Temp: 98.1 F (36.7 C) 98.3 F (36.8 C) 98.4 F (36.9 C)   TempSrc: Oral Oral Oral   SpO2: 93% 92% 93% 94%  Weight:      Height:       No data found.   Intake/Output Summary (Last 24 hours) at 04/27/2024 0936 Last data filed at 04/27/2024 9076 Gross per 24 hour  Intake 2100.03 ml  Output 850 ml  Net 1250.03 ml   Filed Weights   04/14/24 0638 04/14/24 2358  Weight: 117.1 kg 117 kg    Exam:   GEN: NAD SKIN: Warm and dry EYES: No pallor or icterus ENT: MMM CV: RRR PULM: CTA B ABD: soft, obese, NT, +BS CNS: AAO x 3, non focal EXT: No edema or tenderness.  Bilateral BKA      Data Reviewed:   I have personally reviewed following labs and imaging studies:  Labs: Labs show the following:   Basic Metabolic Panel: Recent Labs  Lab 04/21/24 0530 04/21/24 1430 04/22/24 0415 04/23/24 0905 04/24/24 0809 04/25/24 0644 04/26/24 0630 04/27/24 0640  NA 138   < > 139 145 148* 150* 142 140  K 5.6*   < > 4.7 4.3 4.1 3.8 3.3* 3.8  CL 109   < > 107 111 109 112* 105 101  CO2 21*   < > 23 23 26 28 31 25   GLUCOSE 340*   < > 276* 219* 202* 206* 188* 233*  BUN 74*   < > 81* 76* 69* 65* 60* 59*  CREATININE 3.96*   < > 3.89* 3.74* 3.64* 3.46* 3.45* 3.97*  CALCIUM  7.6*   < > 7.8* 7.8*  7.6* 7.6* 7.1* 7.1*  MG 1.8  --  1.9 1.7 1.6* 2.1  --   --   PHOS 5.9*  --   --  5.3* 5.2* 5.1* 4.9* 4.3   < > = values in this interval not displayed.   GFR Estimated Creatinine Clearance: 21.2 mL/min (A) (by C-G formula based on SCr of 3.97 mg/dL (H)). Liver Function Tests: Recent Labs  Lab 04/21/24 0530 04/24/24 0809 04/25/24 0644 04/26/24 0630 04/27/24 0640  AST 17  --  33  --   --   ALT 14  --  23  --   --   ALKPHOS 51  --  43  --   --   BILITOT 0.3  --  0.5  --   --   PROT 6.7  --  5.2*  --   --   ALBUMIN  2.1* 2.2* 2.0* 1.9* 2.0*   No results for input(s): LIPASE, AMYLASE in the last 168 hours. No results for input(s): AMMONIA in the last 168 hours. Coagulation profile No results for input(s): INR, PROTIME in the last 168 hours.   CBC: Recent Labs  Lab 04/22/24 0415 04/23/24 0905 04/24/24 0809 04/26/24 0630 04/27/24 0640  WBC 16.1* 11.1* 9.8 7.1 10.1  NEUTROABS  --  9.2* 7.9*  --   --   HGB 11.2* 11.5* 11.4* 9.4* 11.2*  HCT 34.5* 36.8* 35.7* 29.9* 34.2*  MCV 90.8 92.9 92.7 94.0 90.5  PLT 269 225 208 142* 165   Cardiac Enzymes: No results for input(s): CKTOTAL, CKMB, CKMBINDEX, TROPONINI in the last 168 hours. BNP (last 3 results) No results for input(s): PROBNP in the last 8760 hours. CBG: Recent Labs  Lab 04/26/24 0854 04/26/24 1208 04/26/24 1640 04/26/24 2002 04/27/24 0902  GLUCAP 180* 214* 221* 255* 196*   D-Dimer: No results for input(s): DDIMER in the last 72 hours. Hgb A1c: No results for input(s): HGBA1C in the last 72 hours. Lipid Profile: No results for input(s): CHOL, HDL, LDLCALC, TRIG, CHOLHDL, LDLDIRECT in the last 72 hours. Thyroid function studies: No results for input(s): TSH, T4TOTAL, T3FREE, THYROIDAB in the last 72 hours.  Invalid input(s): FREET3 Anemia work up: No results for input(s): VITAMINB12, FOLATE, FERRITIN, TIBC, IRON , RETICCTPCT in the last 72  hours.  Sepsis Labs: Recent Labs  Lab 04/23/24 0905 04/24/24 0809 04/26/24 0630 04/27/24 0640  WBC 11.1* 9.8 7.1 10.1    Microbiology No results found for this or any previous visit (from the past 240 hours).   Procedures and diagnostic studies:  No results found.              LOS: 12 days   Aanika Defoor  [employer] Chartered loss adjuster on [email or web address]. If 7PM-7AM, please contact night-coverage at [email or web address]     04/27/2024, 9:36 AM

## 2024-04-27 NOTE — Plan of Care (Signed)

## 2024-04-28 DIAGNOSIS — N179 Acute kidney failure, unspecified: Secondary | ICD-10-CM | POA: Diagnosis not present

## 2024-04-28 LAB — MULTIPLE MYELOMA PANEL, SERUM
Albumin SerPl Elph-Mcnc: 2.3 g/dL — ABNORMAL LOW (ref 2.9–4.4)
Albumin/Glob SerPl: 0.8 (ref 0.7–1.7)
Alpha 1: 0.2 g/dL (ref 0.0–0.4)
Alpha2 Glob SerPl Elph-Mcnc: 0.7 g/dL (ref 0.4–1.0)
B-Globulin SerPl Elph-Mcnc: 1 g/dL (ref 0.7–1.3)
Gamma Glob SerPl Elph-Mcnc: 1 g/dL (ref 0.4–1.8)
Globulin, Total: 3 g/dL (ref 2.2–3.9)
IgA: 534 mg/dL — ABNORMAL HIGH (ref 61–437)
IgG (Immunoglobin G), Serum: 1093 mg/dL (ref 603–1613)
IgM (Immunoglobulin M), Srm: 162 mg/dL (ref 20–172)
Total Protein ELP: 5.3 g/dL — ABNORMAL LOW (ref 6.0–8.5)

## 2024-04-28 LAB — RENAL FUNCTION PANEL
Albumin: 1.7 g/dL — ABNORMAL LOW (ref 3.5–5.0)
Anion gap: 4 — ABNORMAL LOW (ref 5–15)
BUN: 58 mg/dL — ABNORMAL HIGH (ref 8–23)
CO2: 28 mmol/L (ref 22–32)
Calcium: 6.6 mg/dL — ABNORMAL LOW (ref 8.9–10.3)
Chloride: 107 mmol/L (ref 98–111)
Creatinine, Ser: 3.94 mg/dL — ABNORMAL HIGH (ref 0.61–1.24)
GFR, Estimated: 17 mL/min — ABNORMAL LOW (ref >60)
Glucose, Bld: 197 mg/dL — ABNORMAL HIGH (ref 70–99)
Phosphorus: 4.4 mg/dL (ref 2.5–4.6)
Potassium: 3.3 mmol/L — ABNORMAL LOW (ref 3.5–5.1)
Sodium: 139 mmol/L (ref 135–145)

## 2024-04-28 LAB — GLUCOSE, CAPILLARY
Glucose-Capillary: 171 mg/dL — ABNORMAL HIGH (ref 70–99)
Glucose-Capillary: 186 mg/dL — ABNORMAL HIGH (ref 70–99)
Glucose-Capillary: 182 mg/dL — ABNORMAL HIGH (ref 70–99)
Glucose-Capillary: 201 mg/dL — ABNORMAL HIGH (ref 70–99)

## 2024-04-28 LAB — MAGNESIUM: Magnesium: 1.8 mg/dL (ref 1.7–2.4)

## 2024-04-28 MED ORDER — ORAL CARE MOUTH RINSE: 15.0000 mL | OROMUCOSAL | Status: DC | PRN

## 2024-04-28 MED ORDER — POTASSIUM CHLORIDE CRYS ER 20 MEQ PO TBCR
40.0000 meq | EXTENDED_RELEASE_TABLET | Freq: Once | ORAL | Status: AC
  Administered 2024-04-28: 40 meq via ORAL
  Filled 2024-04-28: qty 2

## 2024-04-28 MED ORDER — VITAMIN D (ERGOCALCIFEROL) 1.25 MG (50000 UNIT) PO CAPS
50000.0000 [IU] | ORAL_CAPSULE | ORAL | Status: DC
  Filled 2024-04-28: qty 1

## 2024-04-28 NOTE — Progress Notes (Signed)
 [facility]  ROUNDING NOTE   Subjective:   Patient seen resting in bed No family present Easily aroused but nonverbal today High flow nasal cannula in place Foley catheter remains in place  Creatinine stable Urine output 1.8 L in preceding 24 hours  Objective:  Vital signs in last 24 hours:  Temp:  [97.8 F (36.6 C)-98.4 F (36.9 C)] 98.3 F (36.8 C) (08/05 1136) Pulse Rate:  [62-81] 73 (08/05 1136) Resp:  [16-22] 20 (08/05 0508) BP: (127-149)/(66-83) 127/82 (08/05 1136) SpO2:  [91 %-96 %] 93 % (08/05 1136)  Weight change:  Filed Weights   04/14/24 0638 04/14/24 2358  Weight: 117.1 kg 117 kg    Intake/Output: I/O last 3 completed shifts: In: 1401.9 [P.O.:1040; IV Piggyback:361.9] Out: 1830 [Urine:1800; Emesis/NG output:30]   Intake/Output this shift:  Total I/O In: 1240 [P.O.:1240] Out: 3254 [Urine:3254]  Physical Exam: General: NAD  Head: Normocephalic  Eyes: Anicteric  Neck: Supple  Lungs:  2L HFNC  Heart: Regular rate   Abdomen:  Distended and firm  Extremities: Trace peripheral edema.,  Bilateral BKA  Neurologic: Awake and alert  Skin: No rashes  Access: None  Foley in place  Basic Metabolic Panel: Recent Labs  Lab 04/22/24 0415 04/22/24 0415 04/23/24 0905 04/24/24 0809 04/25/24 0644 04/26/24 0630 04/27/24 0640 04/28/24 0450  NA 139  --  145 148* 150* 142 140 139  K 4.7  --  4.3 4.1 3.8 3.3* 3.8 3.3*  CL 107  --  111 109 112* 105 101 107  CO2 23  --  23 26 28 31 25 28   GLUCOSE 276*  --  219* 202* 206* 188* 233* 197*  BUN 81*  --  76* 69* 65* 60* 59* 58*  CREATININE 3.89*  --  3.74* 3.64* 3.46* 3.45* 3.97* 3.94*  CALCIUM  7.8*  --  7.8* 7.6* 7.6* 7.1* 7.1* 6.6*  MG 1.9  --  1.7 1.6* 2.1  --   --  1.8  PHOS  --    < > 5.3* 5.2* 5.1* 4.9* 4.3 4.4   < > = values in this interval not displayed.    Liver Function Tests: Recent Labs  Lab 04/24/24 0809 04/25/24 0644 04/26/24 0630 04/27/24 0640 04/28/24 0450  AST  --  33   --   --   --   ALT  --  23  --   --   --   ALKPHOS  --  43  --   --   --   BILITOT  --  0.5  --   --   --   PROT  --  5.2*  --   --   --   ALBUMIN  2.2* 2.0* 1.9* 2.0* 1.7*   No results for input(s): LIPASE, AMYLASE in the last 168 hours. No results for input(s): AMMONIA in the last 168 hours.  CBC: Recent Labs  Lab 04/22/24 0415 04/23/24 0905 04/24/24 0809 04/26/24 0630 04/27/24 0640  WBC 16.1* 11.1* 9.8 7.1 10.1  NEUTROABS  --  9.2* 7.9*  --   --   HGB 11.2* 11.5* 11.4* 9.4* 11.2*  HCT 34.5* 36.8* 35.7* 29.9* 34.2*  MCV 90.8 92.9 92.7 94.0 90.5  PLT 269 225 208 142* 165    Cardiac Enzymes: No results for input(s): CKTOTAL, CKMB, CKMBINDEX, TROPONINI in the last 168 hours.  BNP: Invalid input(s): POCBNP  CBG: Recent Labs  Lab 04/27/24 1233 04/27/24 1700 04/27/24 2042 04/28/24 0838 04/28/24 1133  GLUCAP 169* 131* 99 171*  186*    Microbiology: Results for orders placed or performed during the hospital encounter of 04/14/24  Urine Culture     Status: Abnormal   Collection Time: 04/14/24  6:43 AM   Specimen: Urine, Random  Result Value Ref Range Status   Specimen Description   Final    URINE, RANDOM Performed at [facility], [address], [place], [place] [address]    Special Requests   Final    NONE Reflexed from [id number] Performed at [facility], [address] [place], [place] [address]    Culture (A)  Final    >=100,000 COLONIES/mL PROTEUS MIRABILIS 10,000 COLONIES/mL STAPHYLOCOCCUS AUREUS    Report Status 04/17/2024 FINAL  Final   Organism ID, Bacteria PROTEUS MIRABILIS (A)  Final   Organism ID, Bacteria STAPHYLOCOCCUS AUREUS (A)  Final      Susceptibility   Proteus mirabilis - MIC*    AMPICILLIN <=2 SENSITIVE Sensitive     CEFAZOLIN  <=4 SENSITIVE Sensitive     CEFEPIME  <=0.12 SENSITIVE Sensitive     CEFTRIAXONE  <=0.25 SENSITIVE Sensitive     CIPROFLOXACIN  >=4 RESISTANT Resistant     GENTAMICIN <=1  SENSITIVE Sensitive     IMIPENEM 2 SENSITIVE Sensitive     NITROFURANTOIN RESISTANT Resistant     TRIMETH /SULFA  <=20 SENSITIVE Sensitive     AMPICILLIN/SULBACTAM <=2 SENSITIVE Sensitive     PIP/TAZO <=4 SENSITIVE Sensitive ug/mL    * >=100,000 COLONIES/mL PROTEUS MIRABILIS   Staphylococcus aureus - MIC*    CIPROFLOXACIN  >=8 RESISTANT Resistant     GENTAMICIN <=0.5 SENSITIVE Sensitive     NITROFURANTOIN <=16 SENSITIVE Sensitive     OXACILLIN <=0.25 SENSITIVE Sensitive     TETRACYCLINE <=1 SENSITIVE Sensitive     VANCOMYCIN  1 SENSITIVE Sensitive     TRIMETH /SULFA  <=10 SENSITIVE Sensitive     RIFAMPIN  <=0.5 SENSITIVE Sensitive     Inducible Clindamycin NEGATIVE Sensitive     LINEZOLID  2 SENSITIVE Sensitive     * 10,000 COLONIES/mL STAPHYLOCOCCUS AUREUS    Coagulation Studies: No results for input(s): LABPROT, INR in the last 72 hours.  Urinalysis: No results for input(s): COLORURINE, LABSPEC, PHURINE, GLUCOSEU, HGBUR, BILIRUBINUR, KETONESUR, PROTEINUR, UROBILINOGEN, NITRITE, LEUKOCYTESUR in the last 72 hours.  Invalid input(s): APPERANCEUR    Imaging: No results found.   Medications:    promethazine  (PHENERGAN ) injection (IM or IVPB) 12.5 mg (04/27/24 1559)    amLODipine   10 mg Per NG tube Daily   Chlorhexidine  Gluconate Cloth  6 each Topical Q0600   diatrizoate  meglumine -sodium  90 mL Per NG tube Once   enoxaparin  (LOVENOX ) injection  30 mg Subcutaneous Q24H   feeding supplement (NEPRO CARB STEADY)  237 mL Oral TID BM   gabapentin   300 mg Per Tube QHS   insulin  aspart  0-5 Units Subcutaneous QHS   insulin  aspart  0-9 Units Subcutaneous TID WC   insulin  glargine-yfgn  10 Units Subcutaneous Q24H   levothyroxine   175 mcg Per NG tube Q0600   linaclotide   145 mcg Per Tube QAC breakfast   melatonin  10 mg Per Tube QHS   metoprolol  tartrate  12.5 mg Per Tube BID   nicotine   21 mg Transdermal Daily   polyethylene glycol  17 g Per NG tube Daily    potassium chloride   40 mEq Oral Once   senna-docusate  1 tablet Per NG tube BID   simethicone   80 mg Per NG tube QID   [START ON 04/29/2024] Vitamin D  (Ergocalciferol )  50,000  Units Oral Q7 days   hydrALAZINE , hydrocortisone  cream, ipratropium-albuterol , morphine  injection, ondansetron  (ZOFRAN ) IV, mouth rinse, oxyCODONE , promethazine  (PHENERGAN ) injection (IM or IVPB), sodium chloride , sodium phosphate , traZODone   Assessment/ Plan:  [name] is a 63 y.o.  male  with past medical conditions including diabetes, bilateral BKA, suprapubic catheter, hepatitis C, who was admitted to [facility] on 04/14/2024 for Suprapubic catheter (HCC) [Z93.59] AKI (acute kidney injury) (HCC) [N17.9] Acute kidney injury (HCC) [N17.9]    Acute kidney injury with proteinuria without recent baseline. Creatinine 4.23 on admission. Acute kidney injury likely secondary to dehydration and ileus.  Creatinine stable today with adequate urine output.  No immediate indication for dialysis.  Continue supportive measures.  Lab Results  Component Value Date   CREATININE 3.94 (H) 04/28/2024   CREATININE 3.97 (H) 04/27/2024   CREATININE 3.45 (H) 04/26/2024     Intake/Output Summary (Last 24 hours) at 04/28/2024 1511 Last data filed at 04/28/2024 1300 Gross per 24 hour  Intake 2211.88 ml  Output 4204 ml  Net -1992.12 ml       2.  Hypertension, essential.  Currently prescribed amlodipine  and metoprolol .  Blood pressure 127/82, acceptable for this patient.   3. Hypernatremia-serum sodium has improved to 140 today and is now in the normal range. Likely in the setting of free water losses.  Sodium stable, 139.  Patient encouraged to maintain adequate oral intake.  Continue supportive measures with antiemetics as needed.   4.  Hypokalemia Potassium 3.3.  Oral supplementation ordered by primary team.  5.  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  Encourage protein  intake, give nepro with meals Urine protein to creatinine ratio of 15.4 although this is a Foley specimen.  Proteinuria likely secondary to diabetic nephropathy.  Hemoglobin A1c of 6.0% from 04/14/2024.  Previously diabetes control was suboptimal.  Adequate control during this admission.   LOS: 13 Raisa Ditto 8/5/20253:11 PM

## 2024-04-28 NOTE — Progress Notes (Incomplete)
 Pt has been requesting [place] [employer] as a treat this nurse educated the pt on diet, pt is non-compliant, drinking regular sodas and requesting

## 2024-04-28 NOTE — Progress Notes (Signed)
 Progress Note    Eugene [name]  FMW:[id number] DOB: 18-Jun-1961  DOA: 04/14/2024 PCP: Care, [facility]      Brief Narrative:    Medical records reviewed and are as summarized below:  Eugene [name] is a 64 y.o. male with medical history significant for hepatitis C, osteomyelitis, spinal stenosis, IBS, diabetes mellitus, depression, anxiety, history of BKA, neurogenic bladder with chronic suprapubic catheter, recurrent fecal impaction, history of bowel obstruction requiring NG tube decompression, who was sent from SNF because of constipation.  Reportedly, he had not moved his bowels for about 6 days prior to admission.  He also had an episode of vomiting.  He was admitted to the hospital for constipation and AKI.       Assessment/Plan:   Principal Problem:   AKI (acute kidney injury) (HCC) Active Problems:   Catheter-associated urinary tract infection (HCC)   Hyperkalemia   Suprapubic catheter (HCC)   Acute kidney injury (HCC)   Acid-base disorder, mixed   Acute hypoxic respiratory failure (HCC)   Ileus (HCC)    Body mass index is 50.38 kg/m.  (Morbid obesity)   Hypernatremia: Resolved.  Oral sodium bicarbonate  was discontinued on 04/25/2024.  AKI: Creatinine down from 4.23-3.97-3.94.  Creatinine had initially gone up from 3.45-3.97.  Creatinine was 4.23 on admission. Urine protein creatinine ratio was 15.41. Suspect underlying CKD.  He Follow-up with nephrologist for further recommendations. Follow-up with nephrologist. Last creatinine on record was 0.84 in July 2022.    Acute hypoxic respiratory failure: Oxygen has been weaned down to 2 L/min.  Wean off oxygen as able. He required up to 6 L of oxygen via HFNC.  VQ scan was low probability for PE. BNP 199.7 2D echo on 04/19/2024 showed EF estimated at 55 to 60%, indeterminate LV diastolic parameters, no significant valvular abnormality detected but overall poor windows for evaluation of LV function by  transthoracic echocardiography.   Adynamic ileus, gastroparesis, intractable nausea and vomiting, chronic constipation: NG tube was placed on 04/23/2024 for gastric decompression.  NG tube came out on the morning of 04/26/2024 (probably accidental removal).  No vomiting reported today.  He did well with grits this morning.  He wants to try solid food like a sandwich.. S/p treatment with IV Reglan  and IV erythromycin  on 04/27/2024. Continue laxatives for constipation.  He said he has had problems with constipation for some time now. He was evaluated by general surgeon on 04/22/2024.  No bowel obstruction and surgeon has signed off.   Probable acute complicated UTI in the setting of underlying chronic suprapubic catheter: He has been on antibiotics since 04/14/2024.   IV ceftriaxone  was discontinued on 04/28/2024   Probable bilateral lower lobe pneumonia on CT chest: Completed antibiotics on 04/28/2024   Type II DM: Continue glargine 10 units daily.NovoLog  as needed for hyperglycemia   Possible acute exacerbation of chronic HFpEF: Appears compensated for now. 2D echo was unremarkable.   Chronic opioid use: Continue oxycodone  as needed.   Hypomagnesemia: Improved Hypokalemia: Replete potassium and monitor levels   Leukocytosis: Improved.  Probably recent prednisone  on 04/20/2024 contributed leukocytosis.   Vitamin D  deficiency: Vitamin D  level was 7.92.  Start high-dose vitamin D  supplement on 04/29/2024   Iron  studies showed iron  studies showed iron  level 58, saturation ratio 35, TIBC 168, ferritin 90 Vitamin B12 level is 240   Diet Order             Diet Carb Modified Fluid consistency: Thin  Diet effective now  Consultants: General Surgeon Nephrologist  Procedures: None    Medications:    amLODipine   10 mg Per NG tube Daily   Chlorhexidine  Gluconate Cloth  6 each Topical Q0600   diatrizoate  meglumine -sodium  90 mL Per NG tube  Once   enoxaparin  (LOVENOX ) injection  30 mg Subcutaneous Q24H   feeding supplement (NEPRO CARB STEADY)  237 mL Oral TID BM   gabapentin   300 mg Per Tube QHS   insulin  aspart  0-5 Units Subcutaneous QHS   insulin  aspart  0-9 Units Subcutaneous TID WC   insulin  glargine-yfgn  10 Units Subcutaneous Q24H   levothyroxine   175 mcg Per NG tube Q0600   linaclotide   145 mcg Per Tube QAC breakfast   melatonin  10 mg Per Tube QHS   metoprolol  tartrate  12.5 mg Per Tube BID   nicotine   21 mg Transdermal Daily   polyethylene glycol  17 g Per NG tube Daily   senna-docusate  1 tablet Per NG tube BID   simethicone   80 mg Per NG tube QID   Continuous Infusions:  promethazine  (PHENERGAN ) injection (IM or IVPB) 12.5 mg (04/27/24 1559)     Anti-infectives (From admission, onward)    Start     Dose/Rate Route Frequency Ordered Stop   04/27/24 1200  erythromycin  500 mg in sodium chloride  0.9 % 100 mL IVPB        500 mg 100 mL/hr over 60 Minutes Intravenous Every 6 hours 04/27/24 0938 04/27/24 1807   04/22/24 1600  cefTRIAXone  (ROCEPHIN ) 2 g in sodium chloride  0.9 % 100 mL IVPB  Status:  Discontinued        2 g 200 mL/hr over 30 Minutes Intravenous Every 24 hours 04/22/24 1417 04/28/24 0750   04/17/24 2200  cephALEXin  (KEFLEX ) capsule 500 mg  Status:  Discontinued        500 mg Oral Every 8 hours 04/17/24 1332 04/22/24 1417   04/16/24 1145  cefTRIAXone  (ROCEPHIN ) 1 g in sodium chloride  0.9 % 100 mL IVPB  Status:  Discontinued        1 g 200 mL/hr over 30 Minutes Intravenous Every 24 hours 04/16/24 1059 04/17/24 1332   04/14/24 0930  cefTRIAXone  (ROCEPHIN ) 2 g in sodium chloride  0.9 % 100 mL IVPB  Status:  Discontinued        2 g 200 mL/hr over 30 Minutes Intravenous  Once 04/14/24 9071 04/14/24 0946              Family Communication/Anticipated D/C date and plan/Code Status   DVT prophylaxis: enoxaparin  (LOVENOX ) injection 30 mg Start: 04/21/24 1100     Code Status: Do not attempt  resuscitation (DNR) PRE-ARREST INTERVENTIONS DESIRED  Family Communication: None Disposition Plan: Plan to discharge to SNF   Status is: Inpatient Remains inpatient appropriate because: Intractable nausea and vomiting       Subjective:   Interval events noted.  No complaints.  He wants to try something solid like a sandwich.  He said he would take it easy.  He has done well with clear liquid diet and actually ate some grits this morning.  He had a small bowel movement yesterday.  No abdominal pain.   Objective:    Vitals:   04/27/24 2355 04/28/24 0508 04/28/24 0841 04/28/24 1136  BP: (!) 146/66 (!) 148/68 (!) 144/83 127/82  Pulse: 79 62 76 73  Resp: 20 20    Temp: 97.8 F (36.6 C) 98.4 F (36.9 C) 98.2 F (36.8 C) 98.3  F (36.8 C)  TempSrc:   Oral Oral  SpO2: 94% 91% 94% 93%  Weight:      Height:       No data found.   Intake/Output Summary (Last 24 hours) at 04/28/2024 1331 Last data filed at 04/28/2024 1137 Gross per 24 hour  Intake 1591.88 ml  Output 4204 ml  Net -2612.12 ml   Filed Weights   04/14/24 0638 04/14/24 2358  Weight: 117.1 kg 117 kg    Exam:   GEN: NAD SKIN: Warm and dry EYES: No pallor or icterus ENT: MMM CV: RRR PULM: CTA B ABD: soft, obese, NT, +BS CNS: AAO x 3, non focal EXT: Bilateral BKA.  No edema or tenderness       Data Reviewed:   I have personally reviewed following labs and imaging studies:  Labs: Labs show the following:   Basic Metabolic Panel: Recent Labs  Lab 04/22/24 0415 04/22/24 0415 04/23/24 0905 04/24/24 0809 04/25/24 0644 04/26/24 0630 04/27/24 0640 04/28/24 0450  NA 139  --  145 148* 150* 142 140 139  K 4.7  --  4.3 4.1 3.8 3.3* 3.8 3.3*  CL 107  --  111 109 112* 105 101 107  CO2 23  --  23 26 28 31 25 28   GLUCOSE 276*  --  219* 202* 206* 188* 233* 197*  BUN 81*  --  76* 69* 65* 60* 59* 58*  CREATININE 3.89*  --  3.74* 3.64* 3.46* 3.45* 3.97* 3.94*  CALCIUM  7.8*  --  7.8* 7.6* 7.6* 7.1* 7.1*  6.6*  MG 1.9  --  1.7 1.6* 2.1  --   --  1.8  PHOS  --    < > 5.3* 5.2* 5.1* 4.9* 4.3 4.4   < > = values in this interval not displayed.   GFR Estimated Creatinine Clearance: 21.4 mL/min (A) (by C-G formula based on SCr of 3.94 mg/dL (H)). Liver Function Tests: Recent Labs  Lab 04/24/24 0809 04/25/24 0644 04/26/24 0630 04/27/24 0640 04/28/24 0450  AST  --  33  --   --   --   ALT  --  23  --   --   --   ALKPHOS  --  43  --   --   --   BILITOT  --  0.5  --   --   --   PROT  --  5.2*  --   --   --   ALBUMIN  2.2* 2.0* 1.9* 2.0* 1.7*   No results for input(s): LIPASE, AMYLASE in the last 168 hours. No results for input(s): AMMONIA in the last 168 hours. Coagulation profile No results for input(s): INR, PROTIME in the last 168 hours.   CBC: Recent Labs  Lab 04/22/24 0415 04/23/24 0905 04/24/24 0809 04/26/24 0630 04/27/24 0640  WBC 16.1* 11.1* 9.8 7.1 10.1  NEUTROABS  --  9.2* 7.9*  --   --   HGB 11.2* 11.5* 11.4* 9.4* 11.2*  HCT 34.5* 36.8* 35.7* 29.9* 34.2*  MCV 90.8 92.9 92.7 94.0 90.5  PLT 269 225 208 142* 165   Cardiac Enzymes: No results for input(s): CKTOTAL, CKMB, CKMBINDEX, TROPONINI in the last 168 hours. BNP (last 3 results) No results for input(s): PROBNP in the last 8760 hours. CBG: Recent Labs  Lab 04/27/24 1233 04/27/24 1700 04/27/24 2042 04/28/24 0838 04/28/24 1133  GLUCAP 169* 131* 99 171* 186*   D-Dimer: No results for input(s): DDIMER in the last 72 hours. Hgb A1c: No  results for input(s): HGBA1C in the last 72 hours. Lipid Profile: No results for input(s): CHOL, HDL, LDLCALC, TRIG, CHOLHDL, LDLDIRECT in the last 72 hours. Thyroid function studies: No results for input(s): TSH, T4TOTAL, T3FREE, THYROIDAB in the last 72 hours.  Invalid input(s): FREET3 Anemia work up: No results for input(s): VITAMINB12, FOLATE, FERRITIN, TIBC, IRON , RETICCTPCT in the last 72 hours.  Sepsis  Labs: Recent Labs  Lab 04/23/24 0905 04/24/24 0809 04/26/24 0630 04/27/24 0640  WBC 11.1* 9.8 7.1 10.1    Microbiology No results found for this or any previous visit (from the past 240 hours).   Procedures and diagnostic studies:  No results found.              LOS: 13 days   Eugene [name]  [employer] loss adjuster on [email or web address]. If 7PM-7AM, please contact night-coverage at [email or web address]     04/28/2024, 1:31 PM

## 2024-04-28 NOTE — Plan of Care (Signed)

## 2024-04-28 NOTE — Plan of Care (Signed)
  Problem: Education: Goal: Ability to describe self-care measures that may prevent or decrease complications (Diabetes Survival Skills Education) will improve Outcome: Progressing Goal: Individualized Educational Video(s) Outcome: Progressing   Problem: Coping: Goal: Ability to adjust to condition or change in health will improve Outcome: Progressing   Problem: Fluid Volume: Goal: Ability to maintain a balanced intake and output will improve Outcome: Progressing   Problem: Health Behavior/Discharge Planning: Goal: Ability to identify and utilize available resources and services will improve Outcome: Progressing Goal: Ability to manage health-related needs will improve Outcome: Progressing   Problem: Metabolic: Goal: Ability to maintain appropriate glucose levels will improve Outcome: Progressing   Problem: Nutritional: Goal: Maintenance of adequate nutrition will improve Outcome: Progressing Goal: Progress toward achieving an optimal weight will improve Outcome: Progressing   Problem: Tissue Perfusion: Goal: Adequacy of tissue perfusion will improve Outcome: Progressing   Problem: Skin Integrity: Goal: Risk for impaired skin integrity will decrease Outcome: Progressing   Problem: Education: Goal: Knowledge of General Education information will improve Description: Including pain rating scale, medication(s)/side effects and non-pharmacologic comfort measures Outcome: Progressing

## 2024-04-29 DIAGNOSIS — N179 Acute kidney failure, unspecified: Secondary | ICD-10-CM | POA: Diagnosis not present

## 2024-04-29 LAB — BASIC METABOLIC PANEL WITH GFR
Anion gap: 10 (ref 5–15)
BUN: 53 mg/dL — ABNORMAL HIGH (ref 8–23)
CO2: 27 mmol/L (ref 22–32)
Calcium: 6.9 mg/dL — ABNORMAL LOW (ref 8.9–10.3)
Chloride: 101 mmol/L (ref 98–111)
Creatinine, Ser: 3.96 mg/dL — ABNORMAL HIGH (ref 0.61–1.24)
GFR, Estimated: 16 mL/min — ABNORMAL LOW (ref >60)
Glucose, Bld: 198 mg/dL — ABNORMAL HIGH (ref 70–99)
Potassium: 3.8 mmol/L (ref 3.5–5.1)
Sodium: 138 mmol/L (ref 135–145)

## 2024-04-29 LAB — GLUCOSE, CAPILLARY: Glucose-Capillary: 221 mg/dL — ABNORMAL HIGH (ref 70–99)

## 2024-04-29 MED ORDER — LACTATED RINGERS IV SOLN: INTRAVENOUS | Status: AC

## 2024-04-29 MED ORDER — METOCLOPRAMIDE HCL 5 MG/ML IJ SOLN
10.0000 mg | Freq: Three times a day (TID) | INTRAMUSCULAR | Status: DC
  Administered 2024-04-29 – 2024-05-01 (×5): 10 mg via INTRAVENOUS
  Filled 2024-04-29 (×6): qty 2

## 2024-04-29 NOTE — Progress Notes (Signed)
 Pt requesting to go outside. Pt reminded of policy of not going off the floor.Charge nurse spoke with patient. Pt also does not have wheel chair here  and open wound on foot. Eugene [name] 04/29/24 6:52 PM

## 2024-04-29 NOTE — TOC Progression Note (Signed)
 Transition of Care [facility]) - Progression Note    Patient Details  Name: Eugene [name] MRN: [id number] Date of Birth: 08-12-1961  [facility] CM/SW Contact  Lauraine JAYSON Carpen, LCSW Phone Number: 04/29/2024, 4:11 PM  Clinical Narrative:   Per MD, potential discharge tomorrow if tolerating PO's. [facility] admissions coordinator is aware.  Expected Discharge Plan: Skilled Nursing Facility Barriers to Discharge: Continued Medical Work up               Expected Discharge Plan and Services       Living arrangements for the past 2 months: Skilled Nursing Facility                                       Social Drivers of Health (SDOH) Interventions SDOH Screenings   Food Insecurity: Food Insecurity Present (02/02/2019)  Transportation Needs: No Transportation Needs (02/02/2019)  Recent Concern: Transportation Needs - Unmet Transportation Needs (11/07/2018)   Received from [facility]  Financial Resource Strain: Medium Risk (02/02/2019)  Physical Activity: Unknown (02/02/2019)  Social Connections: Somewhat Isolated (02/02/2019)  Stress: Stress Concern Present (02/02/2019)  Tobacco Use: High Risk (04/15/2024)    Readmission Risk Interventions     No data to display

## 2024-04-29 NOTE — Progress Notes (Addendum)
 [facility]  ROUNDING NOTE   Subjective:   Patient seen resting quietly Arousable for short periods Remains on O2 2L SP catheter in place  Creatinine stable Urine output 3650 L in preceding 24 hours  Objective:  Vital signs in last 24 hours:  Temp:  [98.6 F (37 C)-98.7 F (37.1 C)] 98.6 F (37 C) (08/06 0428) Pulse Rate:  [77-81] 77 (08/06 0428) Resp:  [18-20] 20 (08/06 0428) BP: (148-156)/(70-85) 156/85 (08/06 0428) SpO2:  [92 %-97 %] 93 % (08/06 0428)  Weight change:  Filed Weights   04/14/24 0638 04/14/24 2358  Weight: 117.1 kg 117 kg    Intake/Output: I/O last 3 completed shifts: In: 2151.9 [P.O.:1840; IV Piggyback:311.9] Out: 4904 [Urine:4304; Emesis/NG output:600]   Intake/Output this shift:  No intake/output data recorded.  Physical Exam: General: NAD  Head: Normocephalic  Eyes: Anicteric  Neck: Supple  Lungs:  2L HFNC  Heart: Regular rate   Abdomen:  Distended and firm  Extremities: Trace peripheral edema.,  Bilateral BKA  Neurologic: Awake and alert  Skin: No rashes  Access: None  Suprapubic catheter in place  Basic Metabolic Panel: Recent Labs  Lab 04/23/24 0905 04/24/24 0809 04/25/24 0644 04/26/24 0630 04/27/24 0640 04/28/24 0450 04/29/24 0617  NA 145 148* 150* 142 140 139 138  K 4.3 4.1 3.8 3.3* 3.8 3.3* 3.8  CL 111 109 112* 105 101 107 101  CO2 23 26 28 31 25 28 27   GLUCOSE 219* 202* 206* 188* 233* 197* 198*  BUN 76* 69* 65* 60* 59* 58* 53*  CREATININE 3.74* 3.64* 3.46* 3.45* 3.97* 3.94* 3.96*  CALCIUM  7.8* 7.6* 7.6* 7.1* 7.1* 6.6* 6.9*  MG 1.7 1.6* 2.1  --   --  1.8  --   PHOS 5.3* 5.2* 5.1* 4.9* 4.3 4.4  --     Liver Function Tests: Recent Labs  Lab 04/24/24 0809 04/25/24 0644 04/26/24 0630 04/27/24 0640 04/28/24 0450  AST  --  33  --   --   --   ALT  --  23  --   --   --   ALKPHOS  --  43  --   --   --   BILITOT  --  0.5  --   --   --   PROT  --  5.2*  --   --   --   ALBUMIN  2.2* 2.0* 1.9* 2.0* 1.7*    No results for input(s): LIPASE, AMYLASE in the last 168 hours. No results for input(s): AMMONIA in the last 168 hours.  CBC: Recent Labs  Lab 04/23/24 0905 04/24/24 0809 04/26/24 0630 04/27/24 0640  WBC 11.1* 9.8 7.1 10.1  NEUTROABS 9.2* 7.9*  --   --   HGB 11.5* 11.4* 9.4* 11.2*  HCT 36.8* 35.7* 29.9* 34.2*  MCV 92.9 92.7 94.0 90.5  PLT 225 208 142* 165    Cardiac Enzymes: No results for input(s): CKTOTAL, CKMB, CKMBINDEX, TROPONINI in the last 168 hours.  BNP: Invalid input(s): POCBNP  CBG: Recent Labs  Lab 04/27/24 2042 04/28/24 0838 04/28/24 1133 04/28/24 1624 04/28/24 2124  GLUCAP 99 171* 186* 182* 201*    Microbiology: Results for orders placed or performed during the hospital encounter of 04/14/24  Urine Culture     Status: Abnormal   Collection Time: 04/14/24  6:43 AM   Specimen: Urine, Random  Result Value Ref Range Status   Specimen Description   Final    URINE, RANDOM Performed at [facility], [address] [place], [place] [address]    Special Requests   Final    NONE Reflexed from [id number] Performed at [facility], [address] [place], [place] [address]    Culture (A)  Final    >=100,000 COLONIES/mL PROTEUS MIRABILIS 10,000 COLONIES/mL STAPHYLOCOCCUS AUREUS    Report Status 04/17/2024 FINAL  Final   Organism ID, Bacteria PROTEUS MIRABILIS (A)  Final   Organism ID, Bacteria STAPHYLOCOCCUS AUREUS (A)  Final      Susceptibility   Proteus mirabilis - MIC*    AMPICILLIN <=2 SENSITIVE Sensitive     CEFAZOLIN  <=4 SENSITIVE Sensitive     CEFEPIME  <=0.12 SENSITIVE Sensitive     CEFTRIAXONE  <=0.25 SENSITIVE Sensitive     CIPROFLOXACIN  >=4 RESISTANT Resistant     GENTAMICIN <=1 SENSITIVE Sensitive     IMIPENEM 2 SENSITIVE Sensitive     NITROFURANTOIN RESISTANT Resistant     TRIMETH /SULFA  <=20 SENSITIVE Sensitive     AMPICILLIN/SULBACTAM <=2 SENSITIVE Sensitive     PIP/TAZO <=4 SENSITIVE Sensitive  ug/mL    * >=100,000 COLONIES/mL PROTEUS MIRABILIS   Staphylococcus aureus - MIC*    CIPROFLOXACIN  >=8 RESISTANT Resistant     GENTAMICIN <=0.5 SENSITIVE Sensitive     NITROFURANTOIN <=16 SENSITIVE Sensitive     OXACILLIN <=0.25 SENSITIVE Sensitive     TETRACYCLINE <=1 SENSITIVE Sensitive     VANCOMYCIN  1 SENSITIVE Sensitive     TRIMETH /SULFA  <=10 SENSITIVE Sensitive     RIFAMPIN  <=0.5 SENSITIVE Sensitive     Inducible Clindamycin NEGATIVE Sensitive     LINEZOLID  2 SENSITIVE Sensitive     * 10,000 COLONIES/mL STAPHYLOCOCCUS AUREUS    Coagulation Studies: No results for input(s): LABPROT, INR in the last 72 hours.  Urinalysis: No results for input(s): COLORURINE, LABSPEC, PHURINE, GLUCOSEU, HGBUR, BILIRUBINUR, KETONESUR, PROTEINUR, UROBILINOGEN, NITRITE, LEUKOCYTESUR in the last 72 hours.  Invalid input(s): APPERANCEUR    Imaging: No results found.   Medications:    promethazine  (PHENERGAN ) injection (IM or IVPB) 12.5 mg (04/29/24 0023)    amLODipine   10 mg Per NG tube Daily   Chlorhexidine  Gluconate Cloth  6 each Topical Q0600   diatrizoate  meglumine -sodium  90 mL Per NG tube Once   enoxaparin  (LOVENOX ) injection  30 mg Subcutaneous Q24H   feeding supplement (NEPRO CARB STEADY)  237 mL Oral TID BM   gabapentin   300 mg Per Tube QHS   insulin  aspart  0-5 Units Subcutaneous QHS   insulin  aspart  0-9 Units Subcutaneous TID WC   insulin  glargine-yfgn  10 Units Subcutaneous Q24H   levothyroxine   175 mcg Per NG tube Q0600   linaclotide   145 mcg Per Tube QAC breakfast   melatonin  10 mg Per Tube QHS   metoCLOPramide  (REGLAN ) injection  10 mg Intravenous Q8H   metoprolol  tartrate  12.5 mg Per Tube BID   nicotine   21 mg Transdermal Daily   polyethylene glycol  17 g Per NG tube Daily   senna-docusate  1 tablet Per NG tube BID   simethicone   80 mg Per NG tube QID   Vitamin D  (Ergocalciferol )  50,000 Units Oral Q7 days   hydrALAZINE , hydrocortisone   cream, ipratropium-albuterol , morphine  injection, ondansetron  (ZOFRAN ) IV, mouth rinse, oxyCODONE , promethazine  (PHENERGAN ) injection (IM or IVPB), sodium chloride , sodium phosphate , traZODone   Assessment/ Plan:  [name] is a 63 y.o.  male  with past medical conditions including diabetes, bilateral BKA, suprapubic catheter, hepatitis C, who was admitted to [facility]24 for Suprapubic catheter (  HCC) [Z93.59] AKI (acute kidney injury) (HCC) [N17.9] Acute kidney injury (HCC) [N17.9]    Acute kidney injury with proteinuria without recent baseline. Creatinine 4.23 on admission. Acute kidney injury likely secondary to dehydration and ileus.  Creatinine remains stable with GFR 16-17. Will order IV hydration, lactated ringers  at 75ml and evaluate renal response.  No immediate indication for dialysis.  Continue supportive measures.  Lab Results  Component Value Date   CREATININE 3.96 (H) 04/29/2024   CREATININE 3.94 (H) 04/28/2024   CREATININE 3.97 (H) 04/27/2024     Intake/Output Summary (Last 24 hours) at 04/29/2024 1338 Last data filed at 04/29/2024 0900 Gross per 24 hour  Intake 0 ml  Output 1000 ml  Net -1000 ml       2.  Hypertension, essential.  Currently prescribed amlodipine  and metoprolol .  Blood pressure 156/85   3. Hypernatremia-serum sodium is now in the normal range. Likely in the setting of free water losses.  Sodium stable.  Patient encouraged to maintain adequate oral intake.     4.  Hypokalemia Potassium 3.8.  Corrected with oral supplementation by primary team.  5.  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  Encourage protein intake, give nepro with meals Urine protein to creatinine ratio of 15.4 although this is a Foley specimen.  Proteinuria likely secondary to diabetic nephropathy.  Hemoglobin A1c of 6.0% from 04/14/2024.  Previously diabetes control was suboptimal.  Well controlled   LOS:  14 Eugene [name] 8/6/20251:38 PM

## 2024-04-29 NOTE — Plan of Care (Signed)
  Problem: Education: Goal: Ability to describe self-care measures that may prevent or decrease complications (Diabetes Survival Skills Education) will improve Outcome: Progressing Goal: Individualized Educational Video(s) Outcome: Progressing   Problem: Coping: Goal: Ability to adjust to condition or change in health will improve Outcome: Progressing   Problem: Fluid Volume: Goal: Ability to maintain a balanced intake and output will improve Outcome: Progressing   Problem: Health Behavior/Discharge Planning: Goal: Ability to identify and utilize available resources and services will improve Outcome: Progressing Goal: Ability to manage health-related needs will improve Outcome: Progressing   Problem: Metabolic: Goal: Ability to maintain appropriate glucose levels will improve Outcome: Progressing   Problem: Nutritional: Goal: Maintenance of adequate nutrition will improve Outcome: Progressing Goal: Progress toward achieving an optimal weight will improve Outcome: Progressing   Problem: Education: Goal: Knowledge of General Education information will improve Description: Including pain rating scale, medication(s)/side effects and non-pharmacologic comfort measures Outcome: Progressing   Problem: Tissue Perfusion: Goal: Adequacy of tissue perfusion will improve Outcome: Progressing

## 2024-04-29 NOTE — Progress Notes (Signed)
 PROGRESS NOTE    Eugene [name]  FMW:[id number] DOB: [birth year] DOA: 04/14/2024 PCP: Care, [facility]    Brief Narrative:   Eugene [name] is a 63 y.o. male with medical history significant for hepatitis C, osteomyelitis, spinal stenosis, IBS, diabetes mellitus, depression, anxiety, history of BKA, neurogenic bladder with chronic suprapubic catheter, recurrent fecal impaction, history of bowel obstruction requiring NG tube decompression, who was sent from SNF because of constipation.  Reportedly, he had not moved his bowels for about 6 days prior to admission.  He also had an episode of vomiting.   He was admitted to the hospital for constipation and AKI.     Assessment & Plan:   Principal Problem:   AKI (acute kidney injury) (HCC) Active Problems:   Catheter-associated urinary tract infection (HCC)   Hyperkalemia   Suprapubic catheter (HCC)   Acute kidney injury (HCC)   Acid-base disorder, mixed   Acute hypoxic respiratory failure (HCC)   Ileus (HCC)  Hypernatremia: Resolved.  Oral sodium bicarbonate  was discontinued on 04/25/2024.  AKI: Creatinine down from 4.23-3.97-3.94.  Creatinine had initially gone up from 3.45-3.97.  Creatinine was 4.23 on admission. Urine protein creatinine ratio was 15.41. Suspect underlying CKD.  He Follow-up with nephrologist for further recommendations. Follow-up with nephrologist. Last creatinine on record was 0.84 in July 2022.       Acute hypoxic respiratory failure: Oxygen has been weaned down to 2 L/min.  Wean off oxygen as able. He required up to 6 L of oxygen via HFNC.  VQ scan was low probability for PE. BNP 199.7 Plan: Continue to wean oxygen as tolerated   Adynamic ileus, gastroparesis, intractable nausea and vomiting, chronic constipation: NG tube was placed on 04/23/2024 for gastric decompression.  NG tube came out on the morning of 04/26/2024 (probably accidental removal).  No vomiting reported today.  He did well with grits  this morning.  He wants to try solid food like a sandwich.. S/p treatment with IV Reglan  and IV erythromycin  on 04/27/2024. Plan: Continue Reglan  for now   Probable acute complicated UTI in the setting of underlying chronic suprapubic catheter: He has been on antibiotics since 04/14/2024.   IV ceftriaxone  was discontinued on 04/28/2024     Probable bilateral lower lobe pneumonia on CT chest: Completed antibiotics on 04/28/2024     Type II DM: Continue glargine 10 units daily.NovoLog  as needed for hyperglycemia     Possible acute exacerbation of chronic HFpEF: Appears compensated for now. 2D echo was unremarkable.     Chronic opioid use: Continue oxycodone  as needed.     Hypomagnesemia: Improved Hypokalemia: Replete potassium and monitor levels     Leukocytosis: Improved.  Probably recent prednisone  on 04/20/2024 contributed leukocytosis.     Vitamin D  deficiency: Vitamin D  level was 7.92.  Start high-dose vitamin D  supplement on 04/29/2024     Iron  studies showed iron  studies showed iron  level 58, saturation ratio 35, TIBC 168, ferritin 90 Vitamin B12 level is 240   DVT prophylaxis: Lovenox  Code Status: DNR Family Communication: None Disposition Plan: Status is: Inpatient Remains inpatient appropriate because: Multiple acute issues as above   Level of care: Med-Surg  Consultants:  None  Procedures:  None  Antimicrobials: None   Subjective: Seen and examined.  Seen in bed.  Flattened affect otherwise stable.  Dors is feeling sick to his stomach  Objective: Vitals:   04/28/24 1136 04/28/24 1626 04/28/24 2125 04/29/24 0428  BP: 127/82 (!) 153/70 (!) 148/82 (!) 156/85  Pulse: 73  81 79 77  Resp:  18  20  Temp: 98.3 F (36.8 C) 98.6 F (37 C) 98.7 F (37.1 C) 98.6 F (37 C)  TempSrc: Oral Oral Oral Oral  SpO2: 93% 92% 97% 93%  Weight:      Height:        Intake/Output Summary (Last 24 hours) at 04/29/2024 1420 Last data filed at 04/29/2024 1348 Gross per 24  hour  Intake 0 ml  Output 1700 ml  Net -1700 ml   Filed Weights   04/14/24 0638 04/14/24 2358  Weight: 117.1 kg 117 kg    Examination:  General exam: Appears calm and comfortable  Respiratory system: Clear to auscultation. Respiratory effort normal. Cardiovascular system: S1-S2, RRR, no murmurs Gastrointestinal system: Obese, soft, NT/ND, normal bowel sounds Central nervous system: Alert and oriented. No focal neurological deficits. Extremities: Bilateral BKA Skin: No rashes, lesions or ulcers Psychiatry: Judgement and insight appear normal. Mood & affect appropriate.     Data Reviewed: I have personally reviewed following labs and imaging studies  CBC: Recent Labs  Lab 04/23/24 0905 04/24/24 0809 04/26/24 0630 04/27/24 0640  WBC 11.1* 9.8 7.1 10.1  NEUTROABS 9.2* 7.9*  --   --   HGB 11.5* 11.4* 9.4* 11.2*  HCT 36.8* 35.7* 29.9* 34.2*  MCV 92.9 92.7 94.0 90.5  PLT 225 208 142* 165   Basic Metabolic Panel: Recent Labs  Lab 04/23/24 0905 04/24/24 0809 04/25/24 0644 04/26/24 0630 04/27/24 0640 04/28/24 0450 04/29/24 0617  NA 145 148* 150* 142 140 139 138  K 4.3 4.1 3.8 3.3* 3.8 3.3* 3.8  CL 111 109 112* 105 101 107 101  CO2 23 26 28 31 25 28 27   GLUCOSE 219* 202* 206* 188* 233* 197* 198*  BUN 76* 69* 65* 60* 59* 58* 53*  CREATININE 3.74* 3.64* 3.46* 3.45* 3.97* 3.94* 3.96*  CALCIUM  7.8* 7.6* 7.6* 7.1* 7.1* 6.6* 6.9*  MG 1.7 1.6* 2.1  --   --  1.8  --   PHOS 5.3* 5.2* 5.1* 4.9* 4.3 4.4  --    GFR: Estimated Creatinine Clearance: 21.3 mL/min (A) (by C-G formula based on SCr of 3.96 mg/dL (H)). Liver Function Tests: Recent Labs  Lab 04/24/24 0809 04/25/24 0644 04/26/24 0630 04/27/24 0640 04/28/24 0450  AST  --  33  --   --   --   ALT  --  23  --   --   --   ALKPHOS  --  43  --   --   --   BILITOT  --  0.5  --   --   --   PROT  --  5.2*  --   --   --   ALBUMIN  2.2* 2.0* 1.9* 2.0* 1.7*   No results for input(s): LIPASE, AMYLASE in the last 168  hours. No results for input(s): AMMONIA in the last 168 hours. Coagulation Profile: No results for input(s): INR, PROTIME in the last 168 hours. Cardiac Enzymes: No results for input(s): CKTOTAL, CKMB, CKMBINDEX, TROPONINI in the last 168 hours. BNP (last 3 results) No results for input(s): PROBNP in the last 8760 hours. HbA1C: No results for input(s): HGBA1C in the last 72 hours. CBG: Recent Labs  Lab 04/27/24 2042 04/28/24 0838 04/28/24 1133 04/28/24 1624 04/28/24 2124  GLUCAP 99 171* 186* 182* 201*   Lipid Profile: No results for input(s): CHOL, HDL, LDLCALC, TRIG, CHOLHDL, LDLDIRECT in the last 72 hours. Thyroid Function Tests: No results for input(s): TSH, T4TOTAL, FREET4,  T3FREE, THYROIDAB in the last 72 hours. Anemia Panel: No results for input(s): VITAMINB12, FOLATE, FERRITIN, TIBC, IRON , RETICCTPCT in the last 72 hours. Sepsis Labs: No results for input(s): PROCALCITON, LATICACIDVEN in the last 168 hours.  No results found for this or any previous visit (from the past 240 hours).       Radiology Studies: No results found.      Scheduled Meds:  amLODipine   10 mg Per NG tube Daily   Chlorhexidine  Gluconate Cloth  6 each Topical Q0600   diatrizoate  meglumine -sodium  90 mL Per NG tube Once   enoxaparin  (LOVENOX ) injection  30 mg Subcutaneous Q24H   feeding supplement (NEPRO CARB STEADY)  237 mL Oral TID BM   gabapentin   300 mg Per Tube QHS   insulin  aspart  0-5 Units Subcutaneous QHS   insulin  aspart  0-9 Units Subcutaneous TID WC   insulin  glargine-yfgn  10 Units Subcutaneous Q24H   levothyroxine   175 mcg Per NG tube Q0600   linaclotide   145 mcg Per Tube QAC breakfast   melatonin  10 mg Per Tube QHS   metoCLOPramide  (REGLAN ) injection  10 mg Intravenous Q8H   metoprolol  tartrate  12.5 mg Per Tube BID   nicotine   21 mg Transdermal Daily   polyethylene glycol  17 g Per NG tube Daily   senna-docusate   1 tablet Per NG tube BID   simethicone   80 mg Per NG tube QID   Vitamin D  (Ergocalciferol )  50,000 Units Oral Q7 days   Continuous Infusions:  lactated ringers      promethazine  (PHENERGAN ) injection (IM or IVPB) 12.5 mg (04/29/24 0023)     LOS: 14 days      Calvin KATHEE Robson, MD [facility]   If 7PM-7AM, please contact night-coverage  04/29/2024, 2:20 PM

## 2024-04-29 NOTE — Plan of Care (Signed)

## 2024-04-29 NOTE — NC FL2 (Signed)
 [place]  MEDICAID [id number] LEVEL OF CARE FORM     IDENTIFICATION  Patient Name: Eugene [name] Birthdate: [birth year] Sex: male Admission Date (Current Location): 04/14/2024  [place] and [place] Number:  Chiropodist and Address:  [facility], [address] [place], [place] [address]      Provider Number: [id number]  Attending Physician Name and Address:  Jhonny Calvin NOVAK, MD  Relative Name and Phone Number:  Paxten, Appelt (Brother) [phone] (Home Phone)    Current Level of Care: Hospital Recommended Level of Care: Skilled Nursing Facility Prior Approval Number:    Date Approved/Denied:   PASRR Number: [id number]  Discharge Plan: SNF    Current Diagnoses: Patient Active Problem List   Diagnosis Date Noted   Ileus (HCC) 04/23/2024   Acute hypoxic respiratory failure (HCC) 04/19/2024   Acid-base disorder, mixed 04/16/2024   Acute kidney injury (HCC) 04/15/2024   Diabetic gastroparesis (HCC) 04/17/2021   Suprapubic catheter dysfunction (HCC)    Pain    Constipation by delayed colonic transit    Nausea and vomiting 10/21/2019   Suprapubic catheter (HCC)    Sepsis (HCC) 10/02/2019   Thoracic spinal stenosis 02/12/2019   Osteomyelitis of thoracic spine (HCC) 02/08/2019   Peripheral vascular disease (HCC) 02/08/2019   HTN (hypertension) 02/08/2019   Dyslipidemia 02/08/2019   Chronic pain syndrome 02/08/2019   Polysubstance abuse (HCC) 02/08/2019   Chronic hepatitis C without hepatic coma (HCC) 02/08/2019   GERD (gastroesophageal reflux disease) 02/08/2019   Cigarette smoker 02/08/2019   Normocytic anemia 02/08/2019   Catheter-associated urinary tract infection (HCC) 02/07/2019   AKI (acute kidney injury) (HCC) 02/07/2019   Hyperkalemia 02/07/2019   Pressure injury of skin 02/07/2019   Hyperlipidemia 11/07/2018   Drug-seeking behavior 11/07/2018   Abnormality of gait 10/17/2018   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    Abdominal pain 03/24/2018   Acute pain of left knee 01/23/2018   MRSA (methicillin resistant Staphylococcus aureus) septicemia (HCC) 08/17/2017   Bacteremia due to Staphylococcus aureus 08/15/2017   Other acute osteomyelitis, left ankle and foot (HCC) 06/29/2017   Diabetic ulcer of right foot associated with diabetes mellitus due to underlying condition (HCC) 06/27/2017   Cellulitis of left foot 06/27/2017   Tobacco use disorder 06/27/2017   Type 2 diabetes mellitus with hyperglycemia, with long-term current use of insulin  (HCC) 06/18/2017    Orientation RESPIRATION BLADDER Height & Weight     Self, Time, Situation, Place  O2 (2L nasal cannula) Incontinent (Suprapubic catheter) Weight: 117 kg Height:  5' (152.4 cm)  BEHAVIORAL SYMPTOMS/MOOD NEUROLOGICAL BOWEL NUTRITION STATUS  Other (Comment) (n/a)  (n/a) Incontinent Diet (Carb Modified/Thin Liquids)  AMBULATORY STATUS COMMUNICATION OF NEEDS Skin   Total Care Verbally Other (Comment) (Dry, Flaky, Ecchymosis)                       Personal Care Assistance Level of Assistance  Bathing, Feeding, Dressing Bathing Assistance: Limited assistance Feeding assistance: Limited assistance Dressing Assistance: Limited assistance     Functional Limitations Info  Sight, Hearing Sight Info: Impaired (Eyeglasses) Hearing Info: Impaired      SPECIAL CARE FACTORS FREQUENCY                       Contractures Contractures Info: Not present    Additional Factors Info  Code Status, Allergies Code Status Info: DNR Allergies Info: NKDA           Current Medications (  04/29/2024):  This is the current hospital active medication list Current Facility-Administered Medications  Medication Dose Route Frequency Provider Last Rate Last Admin   amLODipine  (NORVASC ) tablet 10 mg  10 mg Per NG tube Daily Jens Durand, MD   10 mg at 04/28/24 0910   Chlorhexidine  Gluconate Cloth 2 % PADS 6 each  6 each Topical Q0600 Arlon Carliss ORN, DO    6 each at 04/29/24 0503   diatrizoate  meglumine -sodium (GASTROGRAFIN) 66-10 % solution 90 mL  90 mL Per NG tube Once [employer], [place], PA-C       enoxaparin  (LOVENOX ) injection 30 mg  30 mg Subcutaneous Q24H Calkins, Derek W, DO   30 mg at 04/28/24 1155   feeding supplement (NEPRO CARB STEADY) liquid 237 mL  237 mL Oral TID BM Levorn Ramonita SQUIBB, NP   237 mL at 04/28/24 2000   gabapentin  (NEURONTIN ) capsule 300 mg  300 mg Per Tube QHS Jens Durand, MD   300 mg at 04/28/24 2144   hydrALAZINE  (APRESOLINE ) injection 10 mg  10 mg Intravenous Q6H PRN Rashid, Farhan, MD   10 mg at 04/24/24 2052   hydrocortisone  cream 1 % 1 Application  1 Application Topical TID PRN Awanda City, MD   1 Application at 04/19/24 1744   insulin  aspart (novoLOG ) injection 0-5 Units  0-5 Units Subcutaneous QHS Awanda City, MD   2 Units at 04/28/24 2146   insulin  aspart (novoLOG ) injection 0-9 Units  0-9 Units Subcutaneous TID WC Awanda City, MD   2 Units at 04/28/24 1914   insulin  glargine-yfgn (SEMGLEE ) injection 10 Units  10 Units Subcutaneous Q24H Arlon Carliss ORN, DO   10 Units at 04/28/24 0911   ipratropium-albuterol  (DUONEB) 0.5-2.5 (3) MG/3ML nebulizer solution 3 mL  3 mL Nebulization Q4H PRN Jesus America, NP   3 mL at 04/19/24 0727   levothyroxine  (SYNTHROID ) tablet 175 mcg  175 mcg Per NG tube Q0600 Jens Durand, MD   175 mcg at 04/28/24 0503   linaclotide  (LINZESS ) capsule 145 mcg  145 mcg Per Tube QAC breakfast Jens Durand, MD   145 mcg at 04/28/24 0910   melatonin tablet 10 mg  10 mg Per Tube QHS Ayiku, Bernard, MD   10 mg at 04/28/24 2144   metoprolol  tartrate (LOPRESSOR ) 25 mg/10 mL oral suspension 12.5 mg  12.5 mg Per Tube BID Jens Durand, MD   12.5 mg at 04/28/24 2144   morphine  (PF) 2 MG/ML injection 1 mg  1 mg Intravenous Q3H PRN Rashid, Farhan, MD   1 mg at 04/28/24 2144   nicotine  (NICODERM CQ  - dosed in mg/24 hours) patch 21 mg  21 mg Transdermal Daily Arlon Carliss ORN, DO   21 mg at  04/28/24 9088   ondansetron  (ZOFRAN ) injection 4 mg  4 mg Intravenous Q6H PRN Jesus America, NP   4 mg at 04/29/24 9441   Oral care mouth rinse  15 mL Mouth Rinse PRN Cleatus Delayne GAILS, MD       oxyCODONE  (Oxy IR/ROXICODONE ) immediate release tablet 10 mg  10 mg Per NG tube TID PRN Jens Durand, MD   10 mg at 04/25/24 1634   polyethylene glycol (MIRALAX  / GLYCOLAX ) packet 17 g  17 g Per NG tube Daily Jens Durand, MD   17 g at 04/28/24 0910   promethazine  (PHENERGAN ) 12.5 mg in sodium chloride  0.9 % 50 mL IVPB  12.5 mg Intravenous Q6H PRN Mansy, Jan A, MD 150 mL/hr at 04/29/24 0023 12.5 mg at  04/29/24 0023   senna-docusate (Senokot-S) tablet 1 tablet  1 tablet Per NG tube BID Jens Durand, MD   1 tablet at 04/28/24 2144   simethicone  (MYLICON) chewable tablet 80 mg  80 mg Per NG tube QID Jens Durand, MD   80 mg at 04/28/24 2144   sodium chloride  (OCEAN) 0.65 % nasal spray 1 spray  1 spray Each Nare PRN Cleatus Delayne GAILS, MD       sodium phosphate  (FLEET) enema 1 enema  1 enema Rectal Daily PRN Arlon Honey W, DO       traZODone  (DESYREL ) tablet 100 mg  100 mg Per NG tube QHS PRN Jens Durand, MD   100 mg at 04/28/24 2144   Vitamin D  (Ergocalciferol ) (DRISDOL ) 1.25 MG (50000 UNIT) capsule 50,000 Units  50,000 Units Oral Q7 days Jens Durand, MD         Discharge Medications: Please see discharge summary for a list of discharge medications.  Relevant Imaging Results:  Relevant Lab Results:   Additional Information SSN: [id number]  Asberry CHRISTELLA Jaksch, RN

## 2024-04-30 DIAGNOSIS — N179 Acute kidney failure, unspecified: Secondary | ICD-10-CM | POA: Diagnosis not present

## 2024-04-30 LAB — GLUCOSE, CAPILLARY
Glucose-Capillary: 154 mg/dL — ABNORMAL HIGH (ref 70–99)
Glucose-Capillary: 176 mg/dL — ABNORMAL HIGH (ref 70–99)
Glucose-Capillary: 232 mg/dL — ABNORMAL HIGH (ref 70–99)
Glucose-Capillary: 208 mg/dL — ABNORMAL HIGH (ref 70–99)

## 2024-04-30 LAB — CBC WITH DIFFERENTIAL/PLATELET
Abs Immature Granulocytes: 0.08 K/uL — ABNORMAL HIGH (ref 0.00–0.07)
Basophils Absolute: 0 K/uL (ref 0.0–0.1)
Basophils Relative: 0 %
Eosinophils Absolute: 0.2 K/uL (ref 0.0–0.5)
Eosinophils Relative: 3 %
HCT: 30 % — ABNORMAL LOW (ref 39.0–52.0)
Hemoglobin: 9.6 g/dL — ABNORMAL LOW (ref 13.0–17.0)
Immature Granulocytes: 1 %
Lymphocytes Relative: 18 %
Lymphs Abs: 1.6 K/uL (ref 0.7–4.0)
MCH: 29.7 pg (ref 26.0–34.0)
MCHC: 32 g/dL (ref 30.0–36.0)
MCV: 92.9 fL (ref 80.0–100.0)
Monocytes Absolute: 0.7 K/uL (ref 0.1–1.0)
Monocytes Relative: 8 %
Neutro Abs: 6 K/uL (ref 1.7–7.7)
Neutrophils Relative %: 70 %
Platelets: 188 K/uL (ref 150–400)
RBC: 3.23 MIL/uL — ABNORMAL LOW (ref 4.22–5.81)
RDW: 13.1 % (ref 11.5–15.5)
WBC: 8.6 K/uL (ref 4.0–10.5)
nRBC: 0 % (ref 0.0–0.2)

## 2024-04-30 LAB — BASIC METABOLIC PANEL WITH GFR
Anion gap: 8 (ref 5–15)
BUN: 47 mg/dL — ABNORMAL HIGH (ref 8–23)
CO2: 27 mmol/L (ref 22–32)
Calcium: 6.8 mg/dL — ABNORMAL LOW (ref 8.9–10.3)
Chloride: 105 mmol/L (ref 98–111)
Creatinine, Ser: 3.74 mg/dL — ABNORMAL HIGH (ref 0.61–1.24)
GFR, Estimated: 18 mL/min — ABNORMAL LOW (ref >60)
Glucose, Bld: 195 mg/dL — ABNORMAL HIGH (ref 70–99)
Potassium: 3.8 mmol/L (ref 3.5–5.1)
Sodium: 140 mmol/L (ref 135–145)

## 2024-04-30 NOTE — Plan of Care (Signed)

## 2024-04-30 NOTE — Progress Notes (Signed)
 PROGRESS NOTE    Eugene [name]  FMW:[id number] DOB: Jun 25, 1961 DOA: 04/14/2024 PCP: Care, [facility]    Brief Narrative:   Kesean Serviss is a 63 y.o. male with medical history significant for hepatitis C, osteomyelitis, spinal stenosis, IBS, diabetes mellitus, depression, anxiety, history of BKA, neurogenic bladder with chronic suprapubic catheter, recurrent fecal impaction, history of bowel obstruction requiring NG tube decompression, who was sent from SNF because of constipation.  Reportedly, he had not moved his bowels for about 6 days prior to admission.  He also had an episode of vomiting.   He was admitted to the hospital for constipation and AKI.     Assessment & Plan:   Principal Problem:   AKI (acute kidney injury) (HCC) Active Problems:   Catheter-associated urinary tract infection (HCC)   Hyperkalemia   Suprapubic catheter (HCC)   Acute kidney injury (HCC)   Acid-base disorder, mixed   Acute hypoxic respiratory failure (HCC)   Ileus (HCC)  Hypernatremia: Resolved.  Oral sodium bicarbonate  was discontinued on 04/25/2024.   AKI: Creatinine down from 4.23-3.97-3.94.  Creatinine had initially gone up from 3.45-3.97.  Creatinine was 4.23 on admission. Urine protein creatinine ratio was 15.41. Suspect underlying CKD.  Creatinine stable Plan: Continue fluids for now.  No indication for dialysis.  Nephrology following.     Acute hypoxic respiratory failure: Oxygen has been weaned down to 2 L/min.  Wean off oxygen as able. He required up to 6 L of oxygen via HFNC.  VQ scan was low probability for PE. BNP 199.7 Plan: Continue to wean oxygen as tolerated Currently on 2 L   Adynamic ileus, gastroparesis, intractable nausea and vomiting, chronic constipation: NG tube was placed on 04/23/2024 for gastric decompression.  NG tube came out on the morning of 04/26/2024 (probably accidental removal).  No vomiting reported today.  He did well with grits this morning.  He wants  to try solid food like a sandwich.. S/p treatment with IV Reglan  and IV erythromycin  on 04/27/2024. Plan: Continue IV Reglan  for now.  If patient responds transition to oral tomorrow.  My medical opinion patient will require Protonix  potentially lifelong.  Anytime this medication is discontinued as outpatient patient has recurrence of symptoms   Probable acute complicated UTI in the setting of underlying chronic suprapubic catheter: He has been on antibiotics since 04/14/2024.   IV ceftriaxone  was discontinued on 04/28/2024     Probable bilateral lower lobe pneumonia on CT chest: Completed antibiotics on 04/28/2024     Type II DM: Continue glargine 10 units daily.NovoLog  as needed for hyperglycemia     Possible acute exacerbation of chronic HFpEF: Appears compensated for now. 2D echo was unremarkable.     Chronic opioid use: Continue oxycodone  as needed.     Hypomagnesemia: Improved Hypokalemia: Replete potassium and monitor levels     Leukocytosis: Improved.  Probably recent prednisone  on 04/20/2024 contributed leukocytosis.     Vitamin D  deficiency: Vitamin D  level was 7.92.  Start high-dose vitamin D  supplement on 04/29/2024     Iron  studies showed iron  studies showed iron  level 58, saturation ratio 35, TIBC 168, ferritin 90 Vitamin B12 level is 240   DVT prophylaxis: Lovenox  Code Status: DNR Family Communication: None Disposition Plan: Status is: Inpatient Remains inpatient appropriate because: Multiple acute issues as above   Level of care: Med-Surg  Consultants:  None  Procedures:  None  Antimicrobials: None   Subjective: Seen and examined.  Reports nausea is improving Objective: Vitals:   04/29/24 2101  04/30/24 0439 04/30/24 0822 04/30/24 1037  BP: (!) 162/76 135/64 (!) 171/92 (!) 151/70  Pulse: (!) 41 (!) 40 (!) 39 78  Resp: 20 16 18 18   Temp: 98.6 F (37 C) 98.6 F (37 C) 97.9 F (36.6 C) 98 F (36.7 C)  TempSrc: Oral Oral    SpO2: 92% 94% 93% 96%   Weight:      Height:        Intake/Output Summary (Last 24 hours) at 04/30/2024 1453 Last data filed at 04/30/2024 1300 Gross per 24 hour  Intake 480 ml  Output 650 ml  Net -170 ml   Filed Weights   04/14/24 0638 04/14/24 2358  Weight: 117.1 kg 117 kg    Examination:  General exam: NAD Respiratory system: Lungs clear.  No work of breathing.  2 L Cardiovascular system: S1-S2, RRR, no murmurs Gastrointestinal system: Obese, soft, NT/ND, normal bowel sounds Central nervous system: Alert and oriented. No focal neurological deficits. Extremities: Bilateral BKA Skin: No rashes, lesions or ulcers Psychiatry: Judgement and insight appear normal. Mood & affect appropriate.     Data Reviewed: I have personally reviewed following labs and imaging studies  CBC: Recent Labs  Lab 04/24/24 0809 04/26/24 0630 04/27/24 0640 04/30/24 1000  WBC 9.8 7.1 10.1 8.6  NEUTROABS 7.9*  --   --  6.0  HGB 11.4* 9.4* 11.2* 9.6*  HCT 35.7* 29.9* 34.2* 30.0*  MCV 92.7 94.0 90.5 92.9  PLT 208 142* 165 188   Basic Metabolic Panel: Recent Labs  Lab 04/24/24 0809 04/25/24 0644 04/26/24 0630 04/27/24 0640 04/28/24 0450 04/29/24 0617 04/30/24 1000  NA 148* 150* 142 140 139 138 140  K 4.1 3.8 3.3* 3.8 3.3* 3.8 3.8  CL 109 112* 105 101 107 101 105  CO2 26 28 31 25 28 27 27   GLUCOSE 202* 206* 188* 233* 197* 198* 195*  BUN 69* 65* 60* 59* 58* 53* 47*  CREATININE 3.64* 3.46* 3.45* 3.97* 3.94* 3.96* 3.74*  CALCIUM  7.6* 7.6* 7.1* 7.1* 6.6* 6.9* 6.8*  MG 1.6* 2.1  --   --  1.8  --   --   PHOS 5.2* 5.1* 4.9* 4.3 4.4  --   --    GFR: Estimated Creatinine Clearance: 22.5 mL/min (A) (by C-G formula based on SCr of 3.74 mg/dL (H)). Liver Function Tests: Recent Labs  Lab 04/24/24 0809 04/25/24 0644 04/26/24 0630 04/27/24 0640 04/28/24 0450  AST  --  33  --   --   --   ALT  --  23  --   --   --   ALKPHOS  --  43  --   --   --   BILITOT  --  0.5  --   --   --   PROT  --  5.2*  --   --   --    ALBUMIN  2.2* 2.0* 1.9* 2.0* 1.7*   No results for input(s): LIPASE, AMYLASE in the last 168 hours. No results for input(s): AMMONIA in the last 168 hours. Coagulation Profile: No results for input(s): INR, PROTIME in the last 168 hours. Cardiac Enzymes: No results for input(s): CKTOTAL, CKMB, CKMBINDEX, TROPONINI in the last 168 hours. BNP (last 3 results) No results for input(s): PROBNP in the last 8760 hours. HbA1C: No results for input(s): HGBA1C in the last 72 hours. CBG: Recent Labs  Lab 04/28/24 1624 04/28/24 2124 04/29/24 2102 04/30/24 0818 04/30/24 1227  GLUCAP 182* 201* 221* 154* 176*   Lipid Profile:  No results for input(s): CHOL, HDL, LDLCALC, TRIG, CHOLHDL, LDLDIRECT in the last 72 hours. Thyroid Function Tests: No results for input(s): TSH, T4TOTAL, FREET4, T3FREE, THYROIDAB in the last 72 hours. Anemia Panel: No results for input(s): VITAMINB12, FOLATE, FERRITIN, TIBC, IRON , RETICCTPCT in the last 72 hours. Sepsis Labs: No results for input(s): PROCALCITON, LATICACIDVEN in the last 168 hours.  No results found for this or any previous visit (from the past 240 hours).       Radiology Studies: No results found.      Scheduled Meds:  amLODipine   10 mg Per NG tube Daily   Chlorhexidine  Gluconate Cloth  6 each Topical Q0600   diatrizoate  meglumine -sodium  90 mL Per NG tube Once   enoxaparin  (LOVENOX ) injection  30 mg Subcutaneous Q24H   feeding supplement (NEPRO CARB STEADY)  237 mL Oral TID BM   gabapentin   300 mg Per Tube QHS   insulin  aspart  0-5 Units Subcutaneous QHS   insulin  aspart  0-9 Units Subcutaneous TID WC   insulin  glargine-yfgn  10 Units Subcutaneous Q24H   levothyroxine   175 mcg Per NG tube Q0600   linaclotide   145 mcg Per Tube QAC breakfast   melatonin  10 mg Per Tube QHS   metoCLOPramide  (REGLAN ) injection  10 mg Intravenous Q8H   metoprolol  tartrate  12.5 mg Per Tube BID    nicotine   21 mg Transdermal Daily   polyethylene glycol  17 g Per NG tube Daily   senna-docusate  1 tablet Per NG tube BID   simethicone   80 mg Per NG tube QID   Vitamin D  (Ergocalciferol )  50,000 Units Oral Q7 days   Continuous Infusions:  promethazine  (PHENERGAN ) injection (IM or IVPB) 12.5 mg (04/29/24 0023)     LOS: 15 days      Calvin KATHEE Robson, MD [facility]   If 7PM-7AM, please contact night-coverage  04/30/2024, 2:53 PM

## 2024-04-30 NOTE — Progress Notes (Signed)
 [facility]  ROUNDING NOTE   Subjective:   Patient sitting up in bed Alert and oriented Reports appropriate appetite States he is wheelchair bound at facility  Creatinine stable   Objective:  Vital signs in last 24 hours:  Temp:  [97.9 F (36.6 C)-98.6 F (37 C)] 98 F (36.7 C) (08/07 1037) Pulse Rate:  [39-81] 78 (08/07 1037) Resp:  [16-20] 18 (08/07 1037) BP: (135-171)/(64-92) 151/70 (08/07 1037) SpO2:  [92 %-96 %] 96 % (08/07 1037)  Weight change:  Filed Weights   04/14/24 0638 04/14/24 2358  Weight: 117.1 kg 117 kg    Intake/Output: I/O last 3 completed shifts: In: 0  Out: 2350 [Urine:1750; Emesis/NG output:600]   Intake/Output this shift:  Total I/O In: 240 [P.O.:240] Out: -   Physical Exam: General: NAD  Head: Normocephalic  Eyes: Anicteric  Neck: Supple  Lungs:  2L HFNC  Heart: Regular rate   Abdomen:  Distended and firm  Extremities: Trace peripheral edema.,  Bilateral BKA  Neurologic: Awake and alert  Skin: No rashes  Access: None  Suprapubic catheter in place  Basic Metabolic Panel: Recent Labs  Lab 04/24/24 0809 04/25/24 0644 04/26/24 0630 04/27/24 0640 04/28/24 0450 04/29/24 0617 04/30/24 1000  NA 148* 150* 142 140 139 138 140  K 4.1 3.8 3.3* 3.8 3.3* 3.8 3.8  CL 109 112* 105 101 107 101 105  CO2 26 28 31 25 28 27 27   GLUCOSE 202* 206* 188* 233* 197* 198* 195*  BUN 69* 65* 60* 59* 58* 53* 47*  CREATININE 3.64* 3.46* 3.45* 3.97* 3.94* 3.96* 3.74*  CALCIUM  7.6* 7.6* 7.1* 7.1* 6.6* 6.9* 6.8*  MG 1.6* 2.1  --   --  1.8  --   --   PHOS 5.2* 5.1* 4.9* 4.3 4.4  --   --     Liver Function Tests: Recent Labs  Lab 04/24/24 0809 04/25/24 0644 04/26/24 0630 04/27/24 0640 04/28/24 0450  AST  --  33  --   --   --   ALT  --  23  --   --   --   ALKPHOS  --  43  --   --   --   BILITOT  --  0.5  --   --   --   PROT  --  5.2*  --   --   --   ALBUMIN  2.2* 2.0* 1.9* 2.0* 1.7*   No results for input(s): LIPASE, AMYLASE  in the last 168 hours. No results for input(s): AMMONIA in the last 168 hours.  CBC: Recent Labs  Lab 04/24/24 0809 04/26/24 0630 04/27/24 0640 04/30/24 1000  WBC 9.8 7.1 10.1 8.6  NEUTROABS 7.9*  --   --  6.0  HGB 11.4* 9.4* 11.2* 9.6*  HCT 35.7* 29.9* 34.2* 30.0*  MCV 92.7 94.0 90.5 92.9  PLT 208 142* 165 188    Cardiac Enzymes: No results for input(s): CKTOTAL, CKMB, CKMBINDEX, TROPONINI in the last 168 hours.  BNP: Invalid input(s): POCBNP  CBG: Recent Labs  Lab 04/28/24 1133 04/28/24 1624 04/28/24 2124 04/29/24 2102 04/30/24 0818  GLUCAP 186* 182* 201* 221* 154*    Microbiology: Results for orders placed or performed during the hospital encounter of 04/14/24  Urine Culture     Status: Abnormal   Collection Time: 04/14/24  6:43 AM   Specimen: Urine, Random  Result Value Ref Range Status   Specimen Description   Final    URINE, RANDOM Performed at [facility],  [address] [place], [place] [address]    Special Requests   Final    NONE Reflexed from [phone] [id number] Performed at [facility], [address] [place], [place] [address]    Culture (A)  Final    >=100,000 COLONIES/mL PROTEUS MIRABILIS 10,000 COLONIES/mL STAPHYLOCOCCUS AUREUS    Report Status 04/17/2024 FINAL  Final   Organism ID, Bacteria PROTEUS MIRABILIS (A)  Final   Organism ID, Bacteria STAPHYLOCOCCUS AUREUS (A)  Final      Susceptibility   Proteus mirabilis - MIC*    AMPICILLIN <=2 SENSITIVE Sensitive     CEFAZOLIN  <=4 SENSITIVE Sensitive     CEFEPIME  <=0.12 SENSITIVE Sensitive     CEFTRIAXONE  <=0.25 SENSITIVE Sensitive     CIPROFLOXACIN  >=4 RESISTANT Resistant     GENTAMICIN <=1 SENSITIVE Sensitive     IMIPENEM 2 SENSITIVE Sensitive     NITROFURANTOIN RESISTANT Resistant     TRIMETH /SULFA  <=20 SENSITIVE Sensitive     AMPICILLIN/SULBACTAM <=2 SENSITIVE Sensitive     PIP/TAZO <=4 SENSITIVE Sensitive ug/mL    * >=100,000 COLONIES/mL PROTEUS MIRABILIS    Staphylococcus aureus - MIC*    CIPROFLOXACIN  >=8 RESISTANT Resistant     GENTAMICIN <=0.5 SENSITIVE Sensitive     NITROFURANTOIN <=16 SENSITIVE Sensitive     OXACILLIN <=0.25 SENSITIVE Sensitive     TETRACYCLINE <=1 SENSITIVE Sensitive     VANCOMYCIN  1 SENSITIVE Sensitive     TRIMETH /SULFA  <=10 SENSITIVE Sensitive     RIFAMPIN  <=0.5 SENSITIVE Sensitive     Inducible Clindamycin NEGATIVE Sensitive     LINEZOLID  2 SENSITIVE Sensitive     * 10,000 COLONIES/mL STAPHYLOCOCCUS AUREUS    Coagulation Studies: No results for input(s): LABPROT, INR in the last 72 hours.  Urinalysis: No results for input(s): COLORURINE, LABSPEC, PHURINE, GLUCOSEU, HGBUR, BILIRUBINUR, KETONESUR, PROTEINUR, UROBILINOGEN, NITRITE, LEUKOCYTESUR in the last 72 hours.  Invalid input(s): APPERANCEUR    Imaging: No results found.   Medications:    lactated ringers  75 mL/hr at 04/29/24 1420   promethazine  (PHENERGAN ) injection (IM or IVPB) 12.5 mg (04/29/24 0023)    amLODipine   10 mg Per NG tube Daily   Chlorhexidine  Gluconate Cloth  6 each Topical Q0600   diatrizoate  meglumine -sodium  90 mL Per NG tube Once   enoxaparin  (LOVENOX ) injection  30 mg Subcutaneous Q24H   feeding supplement (NEPRO CARB STEADY)  237 mL Oral TID BM   gabapentin   300 mg Per Tube QHS   insulin  aspart  0-5 Units Subcutaneous QHS   insulin  aspart  0-9 Units Subcutaneous TID WC   insulin  glargine-yfgn  10 Units Subcutaneous Q24H   levothyroxine   175 mcg Per NG tube Q0600   linaclotide   145 mcg Per Tube QAC breakfast   melatonin  10 mg Per Tube QHS   metoCLOPramide  (REGLAN ) injection  10 mg Intravenous Q8H   metoprolol  tartrate  12.5 mg Per Tube BID   nicotine   21 mg Transdermal Daily   polyethylene glycol  17 g Per NG tube Daily   senna-docusate  1 tablet Per NG tube BID   simethicone   80 mg Per NG tube QID   Vitamin D  (Ergocalciferol )  50,000 Units Oral Q7 days   hydrALAZINE , hydrocortisone  cream,  ipratropium-albuterol , morphine  injection, ondansetron  (ZOFRAN ) IV, mouth rinse, oxyCODONE , promethazine  (PHENERGAN ) injection (IM or IVPB), sodium chloride , sodium phosphate , traZODone   Assessment/ Plan:  [name] is a 63 y.o.  male  with past medical conditions including diabetes, bilateral BKA, suprapubic catheter, hepatitis C,  who was admitted to [facility] on 04/14/2024 for Suprapubic catheter (HCC) [Z93.59] AKI (acute kidney injury) (HCC) [N17.9] Acute kidney injury (HCC) [N17.9]    Acute kidney injury with proteinuria without recent baseline. Creatinine 4.23 on admission. Acute kidney injury likely secondary to dehydration and ileus.  Creatinine remains stable. This may respresent patient baseline. Will need to follow with patient at discharge. States he would not like dialysis if needed.   Lab Results  Component Value Date   CREATININE 3.74 (H) 04/30/2024   CREATININE 3.96 (H) 04/29/2024   CREATININE 3.94 (H) 04/28/2024     Intake/Output Summary (Last 24 hours) at 04/30/2024 1217 Last data filed at 04/30/2024 1011 Gross per 24 hour  Intake 240 ml  Output 1350 ml  Net -1110 ml       2.  Hypertension, essential.  Currently prescribed amlodipine  and metoprolol .  Blood pressure 171/92   3. Hypernatremia-serum sodium has improved to 140 today and is now in the normal range. Likely in the setting of free water losses.  Sodium stable.     4.  Hypokalemia Potassium corrected  5.  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  Encourage protein intake, give nepro with meals Urine protein to creatinine ratio of 15.4 although this is a Foley specimen.  Proteinuria likely secondary to diabetic nephropathy.  Hemoglobin A1c of 6.0% from 04/14/2024.  Previously diabetes control was suboptimal.  Adequate control during this admission.   LOS: 15 Eugene [name] 8/7/202512:17 PM

## 2024-04-30 NOTE — Plan of Care (Signed)
  Problem: Coping: Goal: Ability to adjust to condition or change in health will improve Outcome: Progressing   Problem: Fluid Volume: Goal: Ability to maintain a balanced intake and output will improve Outcome: Progressing   Problem: Health Behavior/Discharge Planning: Goal: Ability to identify and utilize available resources and services will improve Outcome: Progressing Goal: Ability to manage health-related needs will improve Outcome: Progressing   Problem: Metabolic: Goal: Ability to maintain appropriate glucose levels will improve Outcome: Progressing   Problem: Nutritional: Goal: Maintenance of adequate nutrition will improve Outcome: Progressing

## 2024-05-01 DIAGNOSIS — N179 Acute kidney failure, unspecified: Secondary | ICD-10-CM | POA: Diagnosis not present

## 2024-05-01 LAB — GLUCOSE, CAPILLARY
Glucose-Capillary: 121 mg/dL — ABNORMAL HIGH (ref 70–99)
Glucose-Capillary: 173 mg/dL — ABNORMAL HIGH (ref 70–99)

## 2024-05-01 MED ORDER — SIMETHICONE 80 MG PO CHEW
80.0000 mg | CHEWABLE_TABLET | Freq: Four times a day (QID) | ORAL | Status: DC
  Administered 2024-05-01: 80 mg via ORAL

## 2024-05-01 MED ORDER — SIMETHICONE 80 MG PO CHEW: 80.0000 mg | CHEWABLE_TABLET | Freq: Four times a day (QID) | ORAL | Status: AC

## 2024-05-01 MED ORDER — INSULIN GLARGINE-YFGN 100 UNIT/ML ~~LOC~~ SOLN: 10.0000 [IU] | Freq: Two times a day (BID) | SUBCUTANEOUS | Status: DC

## 2024-05-01 MED ORDER — POLYETHYLENE GLYCOL 3350 17 G PO PACK: 17.0000 g | PACK | Freq: Every day | ORAL | Status: DC

## 2024-05-01 MED ORDER — GABAPENTIN 300 MG PO CAPS
300.0000 mg | ORAL_CAPSULE | Freq: Every day | ORAL | Status: DC
Start: 2024-05-01 — End: 2024-05-01

## 2024-05-01 MED ORDER — FLUTICASONE PROPIONATE 50 MCG/ACT NA SUSP
2.0000 | Freq: Once | NASAL | Status: AC
  Administered 2024-05-01: 2 via NASAL
  Filled 2024-05-01: qty 16

## 2024-05-01 MED ORDER — SENNOSIDES-DOCUSATE SODIUM 8.6-50 MG PO TABS
1.0000 | ORAL_TABLET | Freq: Two times a day (BID) | ORAL | Status: DC
  Administered 2024-05-01: 1 via ORAL

## 2024-05-01 MED ORDER — MELATONIN 5 MG PO TABS
10.0000 mg | ORAL_TABLET | Freq: Every day | ORAL | Status: DC
Start: 2024-05-01 — End: 2024-05-01

## 2024-05-01 MED ORDER — METOPROLOL TARTRATE 25 MG PO TABS
12.5000 mg | ORAL_TABLET | Freq: Two times a day (BID) | ORAL | Status: DC
  Administered 2024-05-01: 12.5 mg via ORAL
  Filled 2024-05-01: qty 1

## 2024-05-01 MED ORDER — DIPHENHYDRAMINE HCL 50 MG/ML IJ SOLN
12.5000 mg | Freq: Once | INTRAMUSCULAR | Status: AC
  Administered 2024-05-01: 12.5 mg via INTRAVENOUS
  Filled 2024-05-01: qty 1

## 2024-05-01 MED ORDER — AMLODIPINE BESYLATE 10 MG PO TABS
10.0000 mg | ORAL_TABLET | Freq: Every day | ORAL | Status: DC
  Administered 2024-05-01: 10 mg via ORAL

## 2024-05-01 MED ORDER — METOCLOPRAMIDE HCL 5 MG PO TABS
5.0000 mg | ORAL_TABLET | Freq: Three times a day (TID) | ORAL | Status: DC
  Administered 2024-05-01: 5 mg via ORAL
  Filled 2024-05-01 (×2): qty 1

## 2024-05-01 MED ORDER — LINACLOTIDE 145 MCG PO CAPS
145.0000 ug | ORAL_CAPSULE | Freq: Every day | ORAL | Status: DC
  Administered 2024-05-01: 145 ug via ORAL
  Filled 2024-05-01: qty 1

## 2024-05-01 MED ORDER — METOCLOPRAMIDE HCL 10 MG PO TABS: 5.0000 mg | ORAL_TABLET | Freq: Three times a day (TID) | ORAL | Status: DC

## 2024-05-01 MED ORDER — LEVOTHYROXINE SODIUM 50 MCG PO TABS: 175.0000 ug | ORAL_TABLET | Freq: Every day | ORAL | Status: DC

## 2024-05-01 MED ORDER — OXYCODONE HCL 5 MG PO TABS: 10.0000 mg | ORAL_TABLET | Freq: Three times a day (TID) | ORAL | 0 refills | Status: DC

## 2024-05-01 NOTE — Progress Notes (Signed)
 Pt discharged to [facility]. Report called to Harlene, RN, with all questions answered at this time. Pt transported via Soil scientist. Previously placed LUE PICC remains in place, per order. Suprapubic catheter intact. WDL.  Pt discharged on 3L O2 [place]. Education provided to pt on medications, including the need to wear oxygen 24/7. AVS packed and physical script provided and sent with Medical Transport.  Pt stated that he brought a vape into the hospital on admission, and it was taken by staff and locked up in security. No documentation of vape in physical chart or in EPIC. Security contacted regarding vape--item was not found by security. Information provided to pt, Consulting civil engineer, and Associate Professor. Unit manager spoke with pt regarding missing item. [facility] notified.

## 2024-05-01 NOTE — TOC Transition Note (Signed)
 Transition of Care [facility]) - Discharge Note   Patient Details  Name: Amontae Ng MRN: [id number] Date of Birth: [birth year]  [facility] CM/SW Contact:  Corean ONEIDA Haddock, RN Phone Number: 05/01/2024, 11:59 AM   Clinical Narrative:     Patient will DC to: [facility]  Anticipated DC date: 05/01/24  Family notified: attempted to call brother Gustavo per patient request.  VM box was full  Transport ab:[id number]  Per MD patient ready for DC to . RN, patient, , and facility notified of DC. Discharge Summary sent to facility. RN given number for report. DC packet on chart. Ambulance transport requested for patient.   TOC signing off.     Barriers to Discharge: Continued Medical Work up   Patient Goals and CMS Choice            Discharge Placement                       Discharge Plan and Services Additional resources added to the After Visit Summary for                                       Social Drivers of Health (SDOH) Interventions SDOH Screenings   Food Insecurity: Food Insecurity Present (02/02/2019)  Transportation Needs: No Transportation Needs (02/02/2019)  Recent Concern: Transportation Needs - Unmet Transportation Needs (11/07/2018)   Received from [facility]  Financial Resource Strain: Medium Risk (02/02/2019)  Physical Activity: Unknown (02/02/2019)  Social Connections: Somewhat Isolated (02/02/2019)  Stress: Stress Concern Present (02/02/2019)  Tobacco Use: High Risk (04/15/2024)     Readmission Risk Interventions     No data to display

## 2024-05-01 NOTE — Progress Notes (Signed)
 [facility]  ROUNDING NOTE   Subjective:   Patient seen sitting up in bed Attempting to eat breakfast Did complain of nausea yesterday, treated with antiemetics  No updated labs available  Objective:  Vital signs in last 24 hours:  Temp:  [98 F (36.7 C)-98.5 F (36.9 C)] 98 F (36.7 C) (08/08 0732) Pulse Rate:  [40-84] 82 (08/08 0732) Resp:  [16-18] 18 (08/08 0732) BP: (126-174)/(58-89) 140/58 (08/08 0732) SpO2:  [90 %-99 %] 99 % (08/08 0732)  Weight change:  Filed Weights   04/14/24 0638 04/14/24 2358  Weight: 117.1 kg 117 kg    Intake/Output: I/O last 3 completed shifts: In: 480 [P.O.:480] Out: 1600 [Urine:1600]   Intake/Output this shift:  Total I/O In: 120 [P.O.:120] Out: 650 [Urine:650]  Physical Exam: General: NAD  Head: Normocephalic  Eyes: Anicteric  Neck: Supple  Lungs:  2L HFNC  Heart: Regular rate   Abdomen:  Distended and firm  Extremities: Trace peripheral edema.,  Bilateral BKA  Neurologic: Awake and alert  Skin: No rashes  Access: None  Suprapubic catheter in place  Basic Metabolic Panel: Recent Labs  Lab 04/25/24 0644 04/26/24 0630 04/27/24 0640 04/28/24 0450 04/29/24 0617 04/30/24 1000  NA 150* 142 140 139 138 140  K 3.8 3.3* 3.8 3.3* 3.8 3.8  CL 112* 105 101 107 101 105  CO2 28 31 25 28 27 27   GLUCOSE 206* 188* 233* 197* 198* 195*  BUN 65* 60* 59* 58* 53* 47*  CREATININE 3.46* 3.45* 3.97* 3.94* 3.96* 3.74*  CALCIUM  7.6* 7.1* 7.1* 6.6* 6.9* 6.8*  MG 2.1  --   --  1.8  --   --   PHOS 5.1* 4.9* 4.3 4.4  --   --     Liver Function Tests: Recent Labs  Lab 04/25/24 0644 04/26/24 0630 04/27/24 0640 04/28/24 0450  AST 33  --   --   --   ALT 23  --   --   --   ALKPHOS 43  --   --   --   BILITOT 0.5  --   --   --   PROT 5.2*  --   --   --   ALBUMIN  2.0* 1.9* 2.0* 1.7*   No results for input(s): LIPASE, AMYLASE in the last 168 hours. No results for input(s): AMMONIA in the last 168  hours.  CBC: Recent Labs  Lab 04/26/24 0630 04/27/24 0640 04/30/24 1000  WBC 7.1 10.1 8.6  NEUTROABS  --   --  6.0  HGB 9.4* 11.2* 9.6*  HCT 29.9* 34.2* 30.0*  MCV 94.0 90.5 92.9  PLT 142* 165 188    Cardiac Enzymes: No results for input(s): CKTOTAL, CKMB, CKMBINDEX, TROPONINI in the last 168 hours.  BNP: Invalid input(s): POCBNP  CBG: Recent Labs  Lab 04/30/24 1227 04/30/24 1720 04/30/24 2038 05/01/24 0734 05/01/24 1144  GLUCAP 176* 232* 208* 121* 173*    Microbiology: Results for orders placed or performed during the hospital encounter of 04/14/24  Urine Culture     Status: Abnormal   Collection Time: 04/14/24  6:43 AM   Specimen: Urine, Random  Result Value Ref Range Status   Specimen Description   Final    URINE, RANDOM Performed at [facility], [address] [place], [place] [address]    Special Requests   Final    NONE Reflexed from [id number] Performed at [facility], [address] [place], [place] [address]    Culture (A)  Final    >=100,000 COLONIES/mL PROTEUS MIRABILIS 10,000 COLONIES/mL STAPHYLOCOCCUS AUREUS    Report Status 04/17/2024 FINAL  Final   Organism ID, Bacteria PROTEUS MIRABILIS (A)  Final   Organism ID, Bacteria STAPHYLOCOCCUS AUREUS (A)  Final      Susceptibility   Proteus mirabilis - MIC*    AMPICILLIN <=2 SENSITIVE Sensitive     CEFAZOLIN  <=4 SENSITIVE Sensitive     CEFEPIME  <=0.12 SENSITIVE Sensitive     CEFTRIAXONE  <=0.25 SENSITIVE Sensitive     CIPROFLOXACIN  >=4 RESISTANT Resistant     GENTAMICIN <=1 SENSITIVE Sensitive     IMIPENEM 2 SENSITIVE Sensitive     NITROFURANTOIN RESISTANT Resistant     TRIMETH /SULFA  <=20 SENSITIVE Sensitive     AMPICILLIN/SULBACTAM <=2 SENSITIVE Sensitive     PIP/TAZO <=4 SENSITIVE Sensitive ug/mL    * >=100,000 COLONIES/mL PROTEUS MIRABILIS   Staphylococcus aureus - MIC*    CIPROFLOXACIN  >=8 RESISTANT Resistant     GENTAMICIN <=0.5 SENSITIVE Sensitive      NITROFURANTOIN <=16 SENSITIVE Sensitive     OXACILLIN <=0.25 SENSITIVE Sensitive     TETRACYCLINE <=1 SENSITIVE Sensitive     VANCOMYCIN  1 SENSITIVE Sensitive     TRIMETH /SULFA  <=10 SENSITIVE Sensitive     RIFAMPIN  <=0.5 SENSITIVE Sensitive     Inducible Clindamycin NEGATIVE Sensitive     LINEZOLID  2 SENSITIVE Sensitive     * 10,000 COLONIES/mL STAPHYLOCOCCUS AUREUS    Coagulation Studies: No results for input(s): LABPROT, INR in the last 72 hours.  Urinalysis: No results for input(s): COLORURINE, LABSPEC, PHURINE, GLUCOSEU, HGBUR, BILIRUBINUR, KETONESUR, PROTEINUR, UROBILINOGEN, NITRITE, LEUKOCYTESUR in the last 72 hours.  Invalid input(s): APPERANCEUR    Imaging: No results found.   Medications:    promethazine  (PHENERGAN ) injection (IM or IVPB) 12.5 mg (04/29/24 0023)    amLODipine   10 mg Oral Daily   Chlorhexidine  Gluconate Cloth  6 each Topical Q0600   diatrizoate  meglumine -sodium  90 mL Per NG tube Once   diphenhydrAMINE   12.5 mg Intravenous Once   enoxaparin  (LOVENOX ) injection  30 mg Subcutaneous Q24H   feeding supplement (NEPRO CARB STEADY)  237 mL Oral TID BM   fluticasone   2 spray Each Nare Once   gabapentin   300 mg Oral QHS   insulin  aspart  0-5 Units Subcutaneous QHS   insulin  aspart  0-9 Units Subcutaneous TID WC   insulin  glargine-yfgn  10 Units Subcutaneous Q24H   [START ON 05/02/2024] levothyroxine   175 mcg Oral Q0600   linaclotide   145 mcg Oral QAC breakfast   melatonin  10 mg Oral QHS   metoCLOPramide   5 mg Oral TID AC   metoprolol  tartrate  12.5 mg Oral BID   nicotine   21 mg Transdermal Daily   polyethylene glycol  17 g Oral Daily   senna-docusate  1 tablet Oral BID   simethicone   80 mg Oral QID   Vitamin D  (Ergocalciferol )  50,000 Units Oral Q7 days   hydrALAZINE , hydrocortisone  cream, ipratropium-albuterol , morphine  injection, ondansetron  (ZOFRAN ) IV, mouth rinse, oxyCODONE , promethazine  (PHENERGAN ) injection (IM or  IVPB), sodium chloride , traZODone   Assessment/ Plan:  [name] is a 63 y.o.  male  with past medical conditions including diabetes, bilateral BKA, suprapubic catheter, hepatitis C, who was admitted to [facility] on 04/14/2024 for Suprapubic catheter (HCC) [Z93.59] AKI (acute kidney injury) (HCC) [N17.9] Acute kidney injury (HCC) [N17.9]    Acute kidney injury with proteinuria without recent baseline. Creatinine 4.23 on admission. Acute kidney injury likely secondary to dehydration and  ileus.  No updated labs available today.  Patient encouraged to maintain oral intake and hydration.  Will schedule follow-up appointment in our office at discharge.  Lab Results  Component Value Date   CREATININE 3.74 (H) 04/30/2024   CREATININE 3.96 (H) 04/29/2024   CREATININE 3.94 (H) 04/28/2024     Intake/Output Summary (Last 24 hours) at 05/01/2024 1232 Last data filed at 05/01/2024 0951 Gross per 24 hour  Intake 360 ml  Output 1600 ml  Net -1240 ml       2.  Hypertension, essential.  Currently prescribed amlodipine  and metoprolol .  Blood pressure 140/58, acceptable for this patient.   3. Hypernatremia-serum sodium has improved to 140 today and is now in the normal range. Likely in the setting of free water losses.  Sodium 140.    4.  Hypokalemia Potassium corrected  5.  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  Encourage protein intake, give nepro with meals Urine protein to creatinine ratio of 15.4 although this is a Foley specimen.  Proteinuria likely secondary to diabetic nephropathy.  Hemoglobin A1c of 6.0% from 04/14/2024.  Previously diabetes control was suboptimal.  Adequate control during this admission.   LOS: 16 Zyiere Rosemond 8/8/202512:32 PM

## 2024-05-01 NOTE — Discharge Summary (Signed)
 Physician Discharge Summary  Eugene [name] FMW:[id number] DOB: 12-11-1960 DOA: 04/14/2024  PCP: [facility]  Admit date: 04/14/2024 Discharge date: 05/01/2024  Admitted From: SNF Disposition:  SNF  Recommendations for Outpatient Follow-up:  Follow up with PCP in 1-2 weeks Follow up with nephrology as directed  Home Health:No  Equipment/Devices:Oxygen 2L   Discharge Condition:Stable  CODE STATUS:DNR- limited Diet recommendation: Carb mod  Brief/Interim Summary:  Eugene [name] is a 63 y.o. male with medical history significant for hepatitis C, osteomyelitis, spinal stenosis, IBS, diabetes mellitus, depression, anxiety, history of BKA, neurogenic bladder with chronic suprapubic catheter, recurrent fecal impaction, history of bowel obstruction requiring NG tube decompression, who was sent from SNF because of constipation.  Reportedly, he had not moved his bowels for about 6 days prior to admission.  He also had an episode of vomiting.   He was admitted to the hospital for constipation and AKI.    Discharge Diagnoses:  Principal Problem:   AKI (acute kidney injury) (HCC) Active Problems:   Catheter-associated urinary tract infection (HCC)   Hyperkalemia   Suprapubic catheter (HCC)   Acute kidney injury (HCC)   Acid-base disorder, mixed   Acute hypoxic respiratory failure (HCC)   Ileus (HCC) Hypernatremia: Resolved.  Oral sodium bicarbonate  was discontinued on 04/25/2024.    AKI: Creatinine down from 4.23-3.97-3.94.  Creatinine had initially gone up from 3.45-3.97.  Creatinine was 4.23 on admission. Urine protein creatinine ratio was 15.41. Suspect underlying CKD.  Creatinine stable Plan: Creat stable.  GFR 18 DC to SNF Follow up PCP and nephrology May have established new baseline     Acute hypoxic respiratory failure:  Oxygen has been weaned down to 2 L/min.  Wean off oxygen as able.   Adynamic ileus, gastroparesis, intractable nausea and vomiting, chronic  constipation: NG tube was placed on 04/23/2024 for gastric decompression.  NG tube came out on the morning of 04/26/2024 (probably accidental removal).  No vomiting reported today.  He did well with grits this morning.  He wants to try solid food like a sandwich.. S/p treatment with IV Reglan  and IV erythromycin  on 04/27/2024. Plan: Oral reglan  10 TID on Discharge Patient should NOT discontinue this medication Recommend lifelong   Probable acute complicated UTI in the setting of underlying chronic suprapubic catheter: He has been on antibiotics since 04/14/2024.   IV ceftriaxone  was discontinued on 04/28/2024     Probable bilateral lower lobe pneumonia on CT chest: Completed antibiotics on 04/28/2024     Type II DM: Home dose documented as glargine 40U BID.  At discharge recommend reduction to 10U BID.  His glycemic control has been adequate in the hospital.       Discharge Instructions  Discharge Instructions     Diet - low sodium heart healthy   Complete by: As directed    Increase activity slowly   Complete by: As directed       Allergies as of 05/01/2024   No Known Allergies      Medication List     STOP taking these medications    amLODipine  10 MG tablet Commonly known as: NORVASC    insulin  glargine 100 UNIT/ML injection Commonly known as: LANTUS    levofloxacin 750 MG tablet Commonly known as: LEVAQUIN       TAKE these medications    acetaminophen  325 MG tablet Commonly known as: TYLENOL  Take 650 mg by mouth in the morning and at bedtime.   bisacodyl  10 MG suppository Commonly known as: DULCOLAX Place 10 mg  rectally daily as needed for moderate constipation.   Doxepin  HCl 6 MG Tabs Take 6 mg by mouth at bedtime.   furosemide  40 MG tablet Commonly known as: LASIX  Take 1 tablet (40 mg total) by mouth daily.   gabapentin  300 MG capsule Commonly known as: NEURONTIN  Take 300 mg by mouth at bedtime.   hydrocortisone  cream 1 % Apply 1 Application topically  daily.   insulin  glargine-yfgn 100 UNIT/ML injection Commonly known as: SEMGLEE  Inject 0.1 mLs (10 Units total) into the skin 2 (two) times daily.   insulin  lispro 100 UNIT/ML injection Commonly known as: HUMALOG  Inject 0-15 Units into the skin 4 (four) times daily -  before meals and at bedtime. <150= 0 units 151-200= 3 units 201-250= 6 units 251-300= 9 units 301-350= 12 units 351-400= 15 units >400 call MD   lactulose  20 g packet Commonly known as: CEPHULAC  Take 20 g by mouth 2 (two) times daily.   levothyroxine  175 MCG tablet Commonly known as: SYNTHROID  Take 175 mcg by mouth at bedtime.   linaclotide  145 MCG Caps capsule Commonly known as: LINZESS  Take 1 capsule (145 mcg total) by mouth daily before breakfast.   melatonin 5 MG Tabs Take 10 mg by mouth at bedtime.   metoCLOPramide  10 MG tablet Commonly known as: REGLAN  Take 0.5 tablets (5 mg total) by mouth 3 (three) times daily before meals.   metoprolol  succinate 25 MG 24 hr tablet Commonly known as: TOPROL -XL Take 25 mg by mouth daily.   ondansetron  4 MG tablet Commonly known as: ZOFRAN  Take 4 mg by mouth daily.   oxyCODONE  5 MG immediate release tablet Commonly known as: Oxy IR/ROXICODONE  Take 2 tablets (10 mg total) by mouth in the morning, at noon, and at bedtime for 3 days. SNF use only.  Refills per SNF MD What changed:  when to take this additional instructions   polyethylene glycol 17 g packet Commonly known as: MIRALAX  / GLYCOLAX  Take 17 g by mouth 2 (two) times daily. What changed: when to take this   senna-docusate 8.6-50 MG tablet Commonly known as: Senokot-S Take 1 tablet by mouth 2 (two) times daily.   simethicone  80 MG chewable tablet Commonly known as: MYLICON Chew 1 tablet (80 mg total) by mouth 4 (four) times daily.   sitaGLIPtin 100 MG tablet Commonly known as: JANUVIA Take 100 mg by mouth daily.   sodium phosphate  Pediatric 3.5-9.5 GM/59ML enema Place 1 enema rectally every  other day as needed for severe constipation.        Contact information for after-discharge care     Destination     [facility] .   Service: Skilled Nursing Contact information: [address] [place]  [phone]                    No Known Allergies  Consultations: Nephrology   Procedures/Studies: DG Abd Portable 1V Result Date: 04/23/2024 CLINICAL DATA:  NG tube placement EXAM: PORTABLE ABDOMEN - 1 VIEW COMPARISON:  None Available. FINDINGS: NG tube with tip in the stomach. Side port is above the GE junction. The stomach is gas distended. IMPRESSION: 1. NG tube with tip in the stomach. Side port above the GE junction. Recommend advancement by 4 cm. 2. Gas in the stomach. Electronically Signed   By: Jackquline Boxer M.D.   On: 04/23/2024 10:14   DG Abd 2 Views Result Date: 04/22/2024 CLINICAL DATA:  86105 Emesis 86105 [id number] Constipation [id number]. EXAM: ABDOMEN -  2 VIEW COMPARISON:  12/11/2019. FINDINGS: There is marked gaseous distention of the stomach. Correlate for gastric outlet obstruction. There is also increased amount of gas throughout the small bowel and colon, likely manifestation of adynamic ileus. Consider radiographic follow up if clinically indicated. No evidence of pneumoperitoneum, within the limitations of a supine film. No acute osseous abnormalities. There is small to moderate left pleural effusion with associated compressive atelectatic changes in the left lung. There is also small right pleural effusion. The soft tissues are otherwise within normal limits. Surgical changes, devices, tubes and lines: Thoracic spinal fixation hardware noted. IMPRESSION: 1. Marked gaseous distention of the stomach. Correlate clinically for gastric outlet obstruction. 2. Increased amount of gas throughout the small bowel and colon, likely manifestation of adynamic ileus. Consider radiographic follow up, if clinically indicated. 3. Small to  moderate left pleural effusion with associated compressive atelectatic changes in the left lung. Small right pleural effusion. Electronically Signed   By: Ree Molt M.D.   On: 04/22/2024 08:21   CT CHEST WO CONTRAST Result Date: 04/21/2024 CLINICAL DATA:  Respiratory illness. EXAM: CT CHEST WITHOUT CONTRAST TECHNIQUE: Multidetector CT imaging of the chest was performed following the standard protocol without IV contrast. RADIATION DOSE REDUCTION: This exam was performed according to the departmental dose-optimization program which includes automated exposure control, adjustment of the mA and/or kV according to patient size and/or use of iterative reconstruction technique. COMPARISON:  Chest CT dated 04/14/2024. FINDINGS: Evaluation of this exam is limited in the absence of intravenous contrast and streak artifact caused by spinal hardware. Cardiovascular: There is no cardiomegaly or pericardial effusion. There is coronary vascular calcification. The thoracic aorta and central pulmonary arteries are grossly unremarkable. Mediastinum/Nodes: No obvious hilar or mediastinal adenopathy. Evaluation however is limited in the absence of intravenous contrast as well as consolidative changes the lungs. The esophagus is grossly unremarkable. No mediastinal fluid collection Lungs/Pleura: Similar appearance of small bilateral pleural effusions with pleural thickening. Bilateral lower lobe and lingula consolidation similar or slightly progressed since the prior CT. Findings may represent atelectasis but pneumonia is not excluded. Faint ground-glass density in the right lung may represent atelectasis or pneumonia. No pneumothorax. Linear secretions noted in the trachea. The central airways are patent. Upper Abdomen: No acute abnormality. Musculoskeletal: Osteopenia with degenerative changes of the spine. Posterior fusion hardware. No acute osseous pathology. IMPRESSION: 1. Similar appearance of small bilateral pleural  effusions with pleural thickening. 2. Bilateral lower lobe and lingula consolidation similar or slightly progressed since the prior CT. Findings may represent atelectasis but pneumonia is not excluded. Electronically Signed   By: Vanetta Chou M.D.   On: 04/21/2024 16:48   US  RENAL Result Date: 04/20/2024 CLINICAL DATA:  Acute kidney injury EXAM: RENAL / URINARY TRACT ULTRASOUND COMPLETE COMPARISON:  04/14/2024 CT scan FINDINGS: Right Kidney: Renal measurements: 11.7 by 6.5 by 5.8 cm = volume: 228 mL. Echogenicity within normal limits. No mass or hydronephrosis visualized. Left Kidney: Renal measurements: 13.0 by 6.5 by 5.5 cm = volume: 240 mL. Echogenicity within normal limits. No mass or hydronephrosis visualized. Bladder: Empty, suprapubic catheter in place. Other: None. IMPRESSION: 1. Normal sonographic appearance of the kidneys. 2. Empty bladder, suprapubic catheter in place. Electronically Signed   By: Ryan Salvage M.D.   On: 04/20/2024 18:30   NM Pulmonary Perfusion Result Date: 04/20/2024 CLINICAL DATA:  Concern for pulmonary embolism. Elevated D-dimer. Bilateral effusions EXAM: NUCLEAR MEDICINE PERFUSION LUNG SCAN TECHNIQUE: Perfusion images were obtained in multiple projections after intravenous injection  of radiopharmaceutical. RADIOPHARMACEUTICALS:  4.1 mCi Tc-49m MAA COMPARISON:  Radiograph 03/20/2024 FINDINGS: No wedge-shaped peripheral perfusion defects to suggest acute pulmonary edema. Increased attenuation LEFT lower lobe related to large effusion. IMPRESSION: No evidence acute pulmonary disease. LEFT pleural effusion. Electronically Signed   By: Jackquline Boxer M.D.   On: 04/20/2024 16:17   ECHOCARDIOGRAM COMPLETE Result Date: 04/19/2024    ECHOCARDIOGRAM REPORT   Patient Name:   EMBRY HUSS Date of Exam: 04/19/2024 Medical Rec #:  [id number]      Height:       60.0 in Accession #:    [id number]     Weight:       257.9 lb Date of Birth:  05/29/1961      BSA:          2.080 m  Patient Age:    63 years       BP:           153/94 mmHg Patient Gender: M              HR:           84 bpm. Exam Location:  [facility] Procedure: 2D Echo, Cardiac Doppler, Color Doppler and Intracardiac            Opacification Agent (Both Spectral and Color Flow Doppler were            utilized during procedure). Indications:     CHF I50.9  History:         Patient has prior history of Echocardiogram examinations, most                  recent 12/10/2019. Risk Factors:Hypertension, Diabetes,                  Dyslipidemia and Current Smoker.  Sonographer:     Thea Norlander RCS Referring Phys:  [id number] DEREK LELON CANALES Diagnosing Phys: Dwayne D Callwood MD IMPRESSIONS  1. Left ventricular ejection fraction, by estimation, is 55 to 60%. The left ventricle has normal function. The left ventricle has no regional wall motion abnormalities. Left ventricular diastolic function could not be evaluated.  2. Right ventricular systolic function is normal. The right ventricular size is not well visualized.  3. The mitral valve is normal in structure. No evidence of mitral valve regurgitation.  4. The aortic valve is normal in structure. Aortic valve regurgitation is not visualized. Conclusion(s)/Recommendation(s): Poor windows for evaluation of left ventricular function by transthoracic echocardiography. Would recommend an alternative means of evaluation. FINDINGS  Left Ventricle: Left ventricular ejection fraction, by estimation, is 55 to 60%. The left ventricle has normal function. The left ventricle has no regional wall motion abnormalities. Definity  contrast agent was given IV to delineate the left ventricular  endocardial borders. Strain was performed and the global longitudinal strain is indeterminate. The left ventricular internal cavity size was normal in size. There is no left ventricular hypertrophy. Left ventricular diastolic function could not be evaluated. Right Ventricle: The right ventricular size is not well  visualized. Right vetricular wall thickness was not assessed. Right ventricular systolic function is normal. Left Atrium: Left atrial size was not assessed. Right Atrium: Right atrial size was not assessed. Pericardium: There is no evidence of pericardial effusion. Mitral Valve: The mitral valve is normal in structure. No evidence of mitral valve regurgitation. Tricuspid Valve: The tricuspid valve is normal in structure. Tricuspid valve regurgitation is trivial. Aortic Valve: The aortic valve is normal in structure. Aortic valve regurgitation is  not visualized. Aortic valve peak gradient measures 5.4 mmHg. Pulmonic Valve: The pulmonic valve was not well visualized. Pulmonic valve regurgitation is not visualized. Aorta: Aortic root could not be assessed. IAS/Shunts: The interatrial septum was not assessed. Additional Comments: 3D was performed not requiring image post processing on an independent workstation and was indeterminate.  LEFT VENTRICLE PLAX 2D LVOT diam:     2.40 cm   Diastology LV SV:         89        LV e' medial:    8.16 cm/s LV SV Index:   43        LV E/e' medial:  10.6 LVOT Area:     4.52 cm  LV e' lateral:   9.90 cm/s                          LV E/e' lateral: 8.7  LEFT ATRIUM             Index LA Vol (A2C):   41.9 ml 20.15 ml/m LA Vol (A4C):   56.3 ml 27.07 ml/m LA Biplane Vol: 48.7 ml 23.42 ml/m  AORTIC VALVE AV Area (Vmax): 3.80 cm AV Vmax:        116.00 cm/s AV Peak Grad:   5.4 mmHg LVOT Vmax:      97.50 cm/s LVOT Vmean:     62.900 cm/s LVOT VTI:       0.196 m  AORTA Ao Root diam: 4.10 cm Ao Asc diam:  3.70 cm MITRAL VALVE MV Area (PHT): 4.68 cm    SHUNTS MV Decel Time: 162 msec    Systemic VTI:  0.20 m MV E velocity: 86.50 cm/s  Systemic Diam: 2.40 cm MV A velocity: 80.70 cm/s MV E/A ratio:  1.07 Dwayne JONETTA Lovelace MD Electronically signed by Cara JONETTA Lovelace MD Signature Date/Time: 04/19/2024/1:37:58 PM    Final    DG Chest Port 1 View Result Date: 04/19/2024 CLINICAL DATA:  200808.   Hypoxia. EXAM: PORTABLE CHEST 1 VIEW COMPARISON:  CT 04/14/2024 FINDINGS: Small pleural effusions are again noted with overlying opacities on the left-greater-than-right. On CT this appeared largely due to rounded atelectasis. The radiographic appearance is nonspecific. The opacities merge with coarse linear atelectatic markings converging on the hila. The upper lung fields are clear. There is mild cardiomegaly without CHF. The mediastinum is normally outlined. There is thoracic spine fusion hardware and a metallic cylinder graft in the lower thoracic spine. No new abnormality. IMPRESSION: 1. Small pleural effusions with overlying opacities on the left-greater-than-right. On CT this appeared largely due to rounded atelectasis. The radiographic appearance is nonspecific, but the overall aeration seems unchanged. 2. Mild cardiomegaly without CHF. Electronically Signed   By: Francis Quam M.D.   On: 04/19/2024 06:52   CT CHEST ABDOMEN PELVIS WO CONTRAST Result Date: 04/14/2024 CLINICAL DATA:  Constipation. Abdominal pain. Dilated bowel loops and abnormal chest radiograph. EXAM: CT CHEST, ABDOMEN AND PELVIS WITHOUT CONTRAST TECHNIQUE: Multidetector CT imaging of the chest, abdomen and pelvis was performed following the standard protocol without IV contrast. RADIATION DOSE REDUCTION: This exam was performed according to the departmental dose-optimization program which includes automated exposure control, adjustment of the mA and/or kV according to patient size and/or use of iterative reconstruction technique. COMPARISON:  AP CT on 04/17/2021 FINDINGS: CT CHEST FINDINGS Cardiovascular: No acute findings. Mediastinum/Lymph Nodes: No masses or pathologically enlarged lymph nodes identified on this unenhanced exam. Lungs/Pleura: Small bilateral pleural effusions  of pleural thickening. Bilateral pleural-parenchymal scarring greatest in lower lobes, with areas of rounded atelectasis in both lower lobes. Musculoskeletal: No  suspicious bone lesions identified. Thoracic spine fusion hardware noted. CT ABDOMEN AND PELVIS FINDINGS Hepatobiliary: No masses visualized on this unenhanced exam. Mildly distended gallbladder, without other signs of cholecystitis or biliary ductal dilatation. Pancreas: No mass or inflammatory changes identified on this unenhanced exam. Spleen:  Within normal limits in size. Adrenals/Urinary Tract: No evidence of urolithiasis or hydronephrosis. Suprapubic bladder catheter in appropriate position. Stomach/Bowel: No evidence of obstruction, inflammatory process, or abnormal fluid collections. Moderate colonic stool burden noted. Small umbilical hernia is seen which contains only fat. Vascular/Lymphatic: No pathologically enlarged lymph nodes identified. No abdominal aortic aneurysm. Reproductive:  No masses or other significant abnormality. Other:  None. Musculoskeletal:  No suspicious bone lesions identified. IMPRESSION: No evidence of bowel obstruction or other acute findings within the abdomen or pelvis. Small bilateral pleural effusions with pleural thickening. Bilateral pleural-parenchymal scarring, with areas of rounded atelectasis in both lower lobes. Small umbilical hernia, which contains only fat. Electronically Signed   By: Norleen DELENA Kil M.D.   On: 04/14/2024 09:11      Subjective: Seen and examined on day of dc Stable, appropriate for return to SNF  Discharge Exam: Vitals:   05/01/24 0409 05/01/24 0732  BP: 126/75 (!) 140/58  Pulse: 84 82  Resp: 18 18  Temp: 98.4 F (36.9 C) 98 F (36.7 C)  SpO2: 92% 99%   Vitals:   04/30/24 1716 04/30/24 2034 05/01/24 0409 05/01/24 0732  BP: (!) 174/89 127/78 126/75 (!) 140/58  Pulse: (!) 40 80 84 82  Resp: 18 16 18 18   Temp: 98.5 F (36.9 C) 98 F (36.7 C) 98.4 F (36.9 C) 98 F (36.7 C)  TempSrc:   Oral Oral  SpO2: 91% 90% 92% 99%  Weight:      Height:        General: Pt is alert, awake, not in acute distress Cardiovascular: RRR,  S1/S2 +, no rubs, no gallops Respiratory: CTA bilaterally, no wheezing, no rhonchi Abdominal: Soft, NT, ND, bowel sounds + Extremities: BL BKA    The results of significant diagnostics from this hospitalization (including imaging, microbiology, ancillary and laboratory) are listed below for reference.     Microbiology: No results found for this or any previous visit (from the past 240 hours).   Labs: BNP (last 3 results) Recent Labs    04/19/24 0606  BNP 199.7*   Basic Metabolic Panel: Recent Labs  Lab 04/25/24 0644 04/26/24 0630 04/27/24 0640 04/28/24 0450 04/29/24 0617 04/30/24 1000  NA 150* 142 140 139 138 140  K 3.8 3.3* 3.8 3.3* 3.8 3.8  CL 112* 105 101 107 101 105  CO2 28 31 25 28 27 27   GLUCOSE 206* 188* 233* 197* 198* 195*  BUN 65* 60* 59* 58* 53* 47*  CREATININE 3.46* 3.45* 3.97* 3.94* 3.96* 3.74*  CALCIUM  7.6* 7.1* 7.1* 6.6* 6.9* 6.8*  MG 2.1  --   --  1.8  --   --   PHOS 5.1* 4.9* 4.3 4.4  --   --    Liver Function Tests: Recent Labs  Lab 04/25/24 0644 04/26/24 0630 04/27/24 0640 04/28/24 0450  AST 33  --   --   --   ALT 23  --   --   --   ALKPHOS 43  --   --   --   BILITOT 0.5  --   --   --  PROT 5.2*  --   --   --   ALBUMIN  2.0* 1.9* 2.0* 1.7*   No results for input(s): LIPASE, AMYLASE in the last 168 hours. No results for input(s): AMMONIA in the last 168 hours. CBC: Recent Labs  Lab 04/26/24 0630 04/27/24 0640 04/30/24 1000  WBC 7.1 10.1 8.6  NEUTROABS  --   --  6.0  HGB 9.4* 11.2* 9.6*  HCT 29.9* 34.2* 30.0*  MCV 94.0 90.5 92.9  PLT 142* 165 188   Cardiac Enzymes: No results for input(s): CKTOTAL, CKMB, CKMBINDEX, TROPONINI in the last 168 hours. BNP: Invalid input(s): POCBNP CBG: Recent Labs  Lab 04/30/24 0818 04/30/24 1227 04/30/24 1720 04/30/24 2038 05/01/24 0734  GLUCAP 154* 176* 232* 208* 121*   D-Dimer No results for input(s): DDIMER in the last 72 hours. Hgb A1c No results for input(s):  HGBA1C in the last 72 hours. Lipid Profile No results for input(s): CHOL, HDL, LDLCALC, TRIG, CHOLHDL, LDLDIRECT in the last 72 hours. Thyroid function studies No results for input(s): TSH, T4TOTAL, T3FREE, THYROIDAB in the last 72 hours.  Invalid input(s): FREET3 Anemia work up No results for input(s): VITAMINB12, FOLATE, FERRITIN, TIBC, IRON , RETICCTPCT in the last 72 hours. Urinalysis    Component Value Date/Time   COLORURINE YELLOW (A) 04/14/2024 0643   APPEARANCEUR HAZY (A) 04/14/2024 0643   LABSPEC 1.012 04/14/2024 0643   PHURINE 5.0 04/14/2024 0643   GLUCOSEU 50 (A) 04/14/2024 0643   HGBUR MODERATE (A) 04/14/2024 0643   BILIRUBINUR NEGATIVE 04/14/2024 0643   KETONESUR NEGATIVE 04/14/2024 0643   PROTEINUR >=300 (A) 04/14/2024 0643   NITRITE NEGATIVE 04/14/2024 0643   LEUKOCYTESUR MODERATE (A) 04/14/2024 0643   Sepsis Labs Recent Labs  Lab 04/26/24 0630 04/27/24 0640 04/30/24 1000  WBC 7.1 10.1 8.6   Microbiology No results found for this or any previous visit (from the past 240 hours).   Time coordinating discharge:  40 minutes  SIGNED:   Calvin KATHEE Robson, MD  [facility] 05/01/2024, 9:44 AM Pager   If 7PM-7AM, please contact night-coverage

## 2024-05-01 NOTE — Plan of Care (Signed)

## 2024-05-02 ENCOUNTER — Inpatient Hospital Stay
Admission: EM | Admit: 2024-05-02 | Discharge: 2024-05-18 | DRG: 915 | Disposition: A | Discharge status: Other Acute Inpatient Hospital | Attending: Internal Medicine | Admitting: Internal Medicine

## 2024-05-02 ENCOUNTER — Emergency Department

## 2024-05-02 ENCOUNTER — Other Ambulatory Visit: Payer: Self-pay

## 2024-05-02 DIAGNOSIS — Z66 Do not resuscitate: Secondary | ICD-10-CM | POA: Diagnosis present

## 2024-05-02 DIAGNOSIS — N319 Neuromuscular dysfunction of bladder, unspecified: Secondary | ICD-10-CM | POA: Diagnosis present

## 2024-05-02 DIAGNOSIS — E039 Hypothyroidism, unspecified: Secondary | ICD-10-CM | POA: Diagnosis not present

## 2024-05-02 DIAGNOSIS — Z79890 Hormone replacement therapy (postmenopausal): Secondary | ICD-10-CM

## 2024-05-02 DIAGNOSIS — E1142 Type 2 diabetes mellitus with diabetic polyneuropathy: Secondary | ICD-10-CM | POA: Diagnosis present

## 2024-05-02 DIAGNOSIS — J9811 Atelectasis: Secondary | ICD-10-CM | POA: Diagnosis present

## 2024-05-02 DIAGNOSIS — E440 Moderate protein-calorie malnutrition: Secondary | ICD-10-CM | POA: Diagnosis present

## 2024-05-02 DIAGNOSIS — E1122 Type 2 diabetes mellitus with diabetic chronic kidney disease: Secondary | ICD-10-CM | POA: Diagnosis present

## 2024-05-02 DIAGNOSIS — I10 Essential (primary) hypertension: Secondary | ICD-10-CM

## 2024-05-02 DIAGNOSIS — E875 Hyperkalemia: Secondary | ICD-10-CM | POA: Diagnosis not present

## 2024-05-02 DIAGNOSIS — Z9889 Other specified postprocedural states: Secondary | ICD-10-CM

## 2024-05-02 DIAGNOSIS — Z7984 Long term (current) use of oral hypoglycemic drugs: Secondary | ICD-10-CM

## 2024-05-02 DIAGNOSIS — J441 Chronic obstructive pulmonary disease with (acute) exacerbation: Secondary | ICD-10-CM | POA: Diagnosis not present

## 2024-05-02 DIAGNOSIS — I130 Hypertensive heart and chronic kidney disease with heart failure and stage 1 through stage 4 chronic kidney disease, or unspecified chronic kidney disease: Principal | ICD-10-CM | POA: Diagnosis present

## 2024-05-02 DIAGNOSIS — E66813 Obesity, class 3: Secondary | ICD-10-CM | POA: Diagnosis present

## 2024-05-02 DIAGNOSIS — T782XXA Anaphylactic shock, unspecified, initial encounter: Principal | ICD-10-CM | POA: Diagnosis present

## 2024-05-02 DIAGNOSIS — Z1152 Encounter for screening for COVID-19: Secondary | ICD-10-CM

## 2024-05-02 DIAGNOSIS — I959 Hypotension, unspecified: Secondary | ICD-10-CM | POA: Diagnosis present

## 2024-05-02 DIAGNOSIS — J9621 Acute and chronic respiratory failure with hypoxia: Secondary | ICD-10-CM | POA: Diagnosis present

## 2024-05-02 DIAGNOSIS — F17210 Nicotine dependence, cigarettes, uncomplicated: Secondary | ICD-10-CM | POA: Diagnosis present

## 2024-05-02 DIAGNOSIS — Z79899 Other long term (current) drug therapy: Secondary | ICD-10-CM

## 2024-05-02 DIAGNOSIS — Z794 Long term (current) use of insulin: Secondary | ICD-10-CM

## 2024-05-02 DIAGNOSIS — E8809 Other disorders of plasma-protein metabolism, not elsewhere classified: Secondary | ICD-10-CM | POA: Diagnosis present

## 2024-05-02 DIAGNOSIS — Z89511 Acquired absence of right leg below knee: Secondary | ICD-10-CM

## 2024-05-02 DIAGNOSIS — N049 Nephrotic syndrome with unspecified morphologic changes: Secondary | ICD-10-CM

## 2024-05-02 DIAGNOSIS — E785 Hyperlipidemia, unspecified: Secondary | ICD-10-CM | POA: Diagnosis present

## 2024-05-02 DIAGNOSIS — K589 Irritable bowel syndrome without diarrhea: Secondary | ICD-10-CM | POA: Diagnosis present

## 2024-05-02 DIAGNOSIS — N179 Acute kidney failure, unspecified: Principal | ICD-10-CM | POA: Diagnosis present

## 2024-05-02 DIAGNOSIS — N184 Chronic kidney disease, stage 4 (severe): Secondary | ICD-10-CM | POA: Diagnosis present

## 2024-05-02 DIAGNOSIS — E1151 Type 2 diabetes mellitus with diabetic peripheral angiopathy without gangrene: Secondary | ICD-10-CM | POA: Diagnosis present

## 2024-05-02 DIAGNOSIS — R220 Localized swelling, mass and lump, head: Secondary | ICD-10-CM

## 2024-05-02 DIAGNOSIS — J9601 Acute respiratory failure with hypoxia: Secondary | ICD-10-CM | POA: Diagnosis present

## 2024-05-02 DIAGNOSIS — J918 Pleural effusion in other conditions classified elsewhere: Secondary | ICD-10-CM | POA: Diagnosis present

## 2024-05-02 DIAGNOSIS — F419 Anxiety disorder, unspecified: Secondary | ICD-10-CM | POA: Diagnosis present

## 2024-05-02 DIAGNOSIS — G8929 Other chronic pain: Secondary | ICD-10-CM | POA: Diagnosis present

## 2024-05-02 DIAGNOSIS — E1165 Type 2 diabetes mellitus with hyperglycemia: Secondary | ICD-10-CM | POA: Diagnosis present

## 2024-05-02 DIAGNOSIS — D631 Anemia in chronic kidney disease: Secondary | ICD-10-CM | POA: Diagnosis present

## 2024-05-02 DIAGNOSIS — Z89512 Acquired absence of left leg below knee: Secondary | ICD-10-CM

## 2024-05-02 DIAGNOSIS — T380X5A Adverse effect of glucocorticoids and synthetic analogues, initial encounter: Secondary | ICD-10-CM | POA: Diagnosis present

## 2024-05-02 DIAGNOSIS — R062 Wheezing: Secondary | ICD-10-CM

## 2024-05-02 DIAGNOSIS — Z91199 Patient's noncompliance with other medical treatment and regimen due to unspecified reason: Secondary | ICD-10-CM

## 2024-05-02 DIAGNOSIS — I5033 Acute on chronic diastolic (congestive) heart failure: Secondary | ICD-10-CM | POA: Diagnosis present

## 2024-05-02 DIAGNOSIS — K5641 Fecal impaction: Secondary | ICD-10-CM | POA: Diagnosis not present

## 2024-05-02 DIAGNOSIS — I700 Atherosclerosis of aorta: Secondary | ICD-10-CM | POA: Diagnosis present

## 2024-05-02 DIAGNOSIS — Z6841 Body Mass Index (BMI) 40.0 and over, adult: Secondary | ICD-10-CM

## 2024-05-02 DIAGNOSIS — A419 Sepsis, unspecified organism: Secondary | ICD-10-CM | POA: Diagnosis not present

## 2024-05-02 DIAGNOSIS — E669 Obesity, unspecified: Secondary | ICD-10-CM | POA: Diagnosis present

## 2024-05-02 LAB — CBC WITH DIFFERENTIAL/PLATELET
Abs Immature Granulocytes: 0.04 K/uL (ref 0.00–0.07)
Basophils Absolute: 0 K/uL (ref 0.0–0.1)
Basophils Relative: 1 %
Eosinophils Absolute: 0.3 K/uL (ref 0.0–0.5)
Eosinophils Relative: 4 %
HCT: 28.3 % — ABNORMAL LOW (ref 39.0–52.0)
Hemoglobin: 8.9 g/dL — ABNORMAL LOW (ref 13.0–17.0)
Immature Granulocytes: 1 %
Lymphocytes Relative: 17 %
Lymphs Abs: 1.1 K/uL (ref 0.7–4.0)
MCH: 29.8 pg (ref 26.0–34.0)
MCHC: 31.4 g/dL (ref 30.0–36.0)
MCV: 94.6 fL (ref 80.0–100.0)
Monocytes Absolute: 0.7 K/uL (ref 0.1–1.0)
Monocytes Relative: 11 %
Neutro Abs: 4.3 K/uL (ref 1.7–7.7)
Neutrophils Relative %: 66 %
Platelets: 210 K/uL (ref 150–400)
RBC: 2.99 MIL/uL — ABNORMAL LOW (ref 4.22–5.81)
RDW: 13.1 % (ref 11.5–15.5)
WBC: 6.5 K/uL (ref 4.0–10.5)
nRBC: 0 % (ref 0.0–0.2)

## 2024-05-02 LAB — COMPREHENSIVE METABOLIC PANEL WITH GFR
ALT: 14 U/L (ref 0–44)
AST: 16 U/L (ref 15–41)
Albumin: 1.8 g/dL — ABNORMAL LOW (ref 3.5–5.0)
Alkaline Phosphatase: 48 U/L (ref 38–126)
Anion gap: 8 (ref 5–15)
BUN: 46 mg/dL — ABNORMAL HIGH (ref 8–23)
CO2: 27 mmol/L (ref 22–32)
Calcium: 7.2 mg/dL — ABNORMAL LOW (ref 8.9–10.3)
Chloride: 104 mmol/L (ref 98–111)
Creatinine, Ser: 4.19 mg/dL — ABNORMAL HIGH (ref 0.61–1.24)
GFR, Estimated: 15 mL/min — ABNORMAL LOW (ref >60)
Glucose, Bld: 145 mg/dL — ABNORMAL HIGH (ref 70–99)
Potassium: 4.3 mmol/L (ref 3.5–5.1)
Sodium: 139 mmol/L (ref 135–145)
Total Bilirubin: 0.4 mg/dL (ref 0.0–1.2)
Total Protein: 5.6 g/dL — ABNORMAL LOW (ref 6.5–8.1)

## 2024-05-02 LAB — RESP PANEL BY RT-PCR (RSV, FLU A&B, COVID)  RVPGX2
Influenza A by PCR: NEGATIVE
Influenza B by PCR: NEGATIVE
Resp Syncytial Virus by PCR: NEGATIVE
SARS Coronavirus 2 by RT PCR: NEGATIVE

## 2024-05-02 LAB — TROPONIN I (HIGH SENSITIVITY)
Troponin I (High Sensitivity): 18 ng/L — ABNORMAL HIGH (ref <18)
Troponin I (High Sensitivity): 18 ng/L — ABNORMAL HIGH (ref <18)

## 2024-05-02 LAB — BRAIN NATRIURETIC PEPTIDE: B Natriuretic Peptide: 172.1 pg/mL — ABNORMAL HIGH (ref 0.0–100.0)

## 2024-05-02 MED ORDER — METHYLPREDNISOLONE SODIUM SUCC 125 MG IJ SOLR
125.0000 mg | Freq: Once | INTRAMUSCULAR | Status: AC
  Administered 2024-05-02: 125 mg via INTRAVENOUS
  Filled 2024-05-02: qty 2

## 2024-05-02 MED ORDER — FAMOTIDINE IN NACL 20-0.9 MG/50ML-% IV SOLN
20.0000 mg | Freq: Once | INTRAVENOUS | Status: AC
  Administered 2024-05-02: 20 mg via INTRAVENOUS
  Filled 2024-05-02: qty 50

## 2024-05-02 MED ORDER — EPINEPHRINE 0.3 MG/0.3ML IJ SOAJ
0.3000 mg | Freq: Once | INTRAMUSCULAR | Status: AC
  Administered 2024-05-02: 0.3 mg via INTRAMUSCULAR
  Filled 2024-05-02: qty 0.3

## 2024-05-02 MED ORDER — IPRATROPIUM-ALBUTEROL 0.5-2.5 (3) MG/3ML IN SOLN
3.0000 mL | Freq: Once | RESPIRATORY_TRACT | Status: AC
  Administered 2024-05-02: 3 mL via RESPIRATORY_TRACT
  Filled 2024-05-02: qty 3

## 2024-05-02 MED ORDER — DIPHENHYDRAMINE HCL 50 MG/ML IJ SOLN
25.0000 mg | Freq: Once | INTRAMUSCULAR | Status: AC
  Administered 2024-05-02: 25 mg via INTRAVENOUS
  Filled 2024-05-02: qty 1

## 2024-05-02 NOTE — ED Notes (Signed)
 CCMD called and advised pt O2 sat and dropped to 85% on the . Pt increased to 5 liters and improved to 91 %.

## 2024-05-02 NOTE — ED Provider Notes (Signed)
 [facility] Provider Note    Event Date/Time   First MD Initiated Contact with Patient 05/02/24 1908     (approximate)   History   Shortness of Breath and Facial Swelling  Pt presents via EMS c/o SOB and facial swelling. Recently dx with PICC line for abx for UTI per EMS. EMS report 02 saturation in 70s on room air, placed on 6L 02 with improvement to 92%. EMS reports on 02 PRN, unknown exact amount.   Pt reports chronic back pain. Also reports abd pain. Reports has been vomiting for the past 6 days.    HPI Eugene [name] is a 63 y.o. male PMH hepatitis C, osteomyelitis, spinal stenosis, IBS, DM, depression, anxiety, bilateral BKA, neurogenic bladder with chronic suprapubic catheter, recurrent fecal impaction, history of bowel obstruction requiring NG tube decompression -Per chart review, patient was recently admitted for constipation, ileus, gastroparesis.  Also found to have likely UTI as well as bilateral lower lobe pneumonia, treated with antibiotics. - Per EMS, patient was sent from his facility today due to facial swelling.  They gave him 25 mg of Benadryl .  Appears to have an increased oxygen requirement as well. EMS notes patient is being treated with antibiotics through PICC line, - Appears discharged with a 2 L oxygen requirement - Patient tells me he has been feeling short of breath today, unsure why.  Denies fevers.  Denies chest pain, abdominal pain.  Notes he had been vomiting for several days prior to today though is not actively vomiting currently.  Does not know of any rashes.  Does have obvious periorbital swelling on my eval.   Patient is comfortable with about 100+ pages of paperwork.  I do not see any obvious antibiotics that he is currently receiving, I was able to find an order for discontinuation of his PICC line in his left upper extremity as of yesterday.  Does not appear to be on any ACE/ARB medications.       Physical Exam    Triage Vital Signs: ED Triage Vitals  Encounter Vitals Group     BP      Girls Systolic BP Percentile      Girls Diastolic BP Percentile      Boys Systolic BP Percentile      Boys Diastolic BP Percentile      Pulse      Resp      Temp      Temp src      SpO2      Weight      Height      Head Circumference      Peak Flow      Pain Score      Pain Loc      Pain Education      Exclude from Growth Chart     Most recent vital signs: Vitals:   05/02/24 1910  BP: 122/82  Pulse: 74  Resp: 20  Temp: 99.1 F (37.3 C)  SpO2: 93%     General: Awake, mild respiratory distress HEENT: + Notable bilateral periorbital edema, possible trace tongue swelling, possible mild uvular edema.  No stridor apparent. CV:  Good peripheral perfusion. RRR, RP 2+ Resp:  Mild respiratory distress, speaking full sentences, end expiratory wheezing bilateral upper lung fields Abd:  No distention. Nontender to deep palpation throughout Other:  S/p BKA BLE. PICC LUE. Foley in place, clear yellow urine.    ED Results / Procedures / Treatments   Labs (  all labs ordered are listed, but only abnormal results are displayed) Labs Reviewed  RESP PANEL BY RT-PCR (RSV, FLU A&B, COVID)  [id number]  COMPREHENSIVE METABOLIC PANEL WITH GFR  CBC WITH DIFFERENTIAL/PLATELET  BRAIN NATRIURETIC PEPTIDE  TROPONIN I (HIGH SENSITIVITY)     EKG  Ecg = sinus rhythm, rate 75, no gross ST elevation or depression, no significant repolarization abnormality, normal axis, normal intervals.  No clear evidence of ischemia or arrhythmia on my interpretation.   RADIOLOGY *** {USE THE WORD INTERPRETED!! You MUST document your own interpretation of imaging, as well as the fact that you reviewed the radiologist's report!:1}   PROCEDURES:  Critical Care performed: Yes, see critical care procedure note(s)  .Critical Care  Performed by: Clarine Ozell LABOR, MD Authorized by: Clarine Ozell LABOR, MD   Critical care provider  statement:    Critical care time (minutes):  30   Critical care time was exclusive of:  Separately billable procedures and treating other patients   Critical care was necessary to treat or prevent imminent or life-threatening deterioration of the following conditions: presumed anaphylaxis, treating w/ epi.   Critical care was time spent personally by me on the following activities:  Development of treatment plan with patient or surrogate, discussions with consultants, evaluation of patient's response to treatment, examination of patient, ordering and review of laboratory studies, ordering and review of radiographic studies, ordering and performing treatments and interventions, pulse oximetry, re-evaluation of patient's condition and review of old charts   I assumed direction of critical care for this patient from another provider in my specialty: no     Care discussed with: admitting provider      MEDICATIONS ORDERED IN ED: Medications  famotidine  (PEPCID ) IVPB 20 mg premix (has no administration in time range)  diphenhydrAMINE  (BENADRYL ) injection 25 mg (25 mg Intravenous Given 05/02/24 1935)  EPINEPHrine  (EPI-PEN) injection 0.3 mg (0.3 mg Intramuscular Given 05/02/24 1934)  methylPREDNISolone  sodium succinate (SOLU-MEDROL ) 125 mg/2 mL injection 125 mg (125 mg Intravenous Given 05/02/24 1934)  ipratropium-albuterol  (DUONEB) 0.5-2.5 (3) MG/3ML nebulizer solution 3 mL (3 mLs Nebulization Given 05/02/24 1935)     IMPRESSION / MDM / ASSESSMENT AND PLAN / ED COURSE  I reviewed the triage vital signs and the nursing notes.                              DDX/MDM/AP: Differential diagnosis includes, but is not limited to, anaphylaxis given facial swelling, wheezing with acute on chronic hypoxia though precipitant remains unclear at this time.  No known allergies and are not able to ascertain any new medications that patient did not receive recently in the hospital without reaction.  Considered but doubt SVC  syndrome.  Consider underlying pneumonia or viral syndrome including influenza or COVID-19.  Considered but doubt ACS.  Not clinically suspect PE at this time given notable wheezing on exam though reconsider if not improving with treatment.  Will treat empirically for presumed anaphylaxis at this time, does meet criteria.  Plan: - Epinephrine  IM - 25 mg IV Benadryl , 20 mg IV famotidine , albuterol , IV steroids - EKG - Labs  -chest x-ray -cardiac monitor -  reassess  Patient's presentation is most consistent with acute presentation with potential threat to life or bodily function.  The patient is on the cardiac monitor to evaluate for evidence of arrhythmia and/or significant heart rate changes.  ED course below. ***      FINAL CLINICAL IMPRESSION(S) /  ED DIAGNOSES   Final diagnoses:  None     Rx / DC Orders   ED Discharge Orders     None        Note:  This document was prepared using Dragon voice recognition software and may include unintentional dictation errors.

## 2024-05-02 NOTE — ED Notes (Signed)
 Fall risk bundle is currently in place.

## 2024-05-02 NOTE — ED Notes (Signed)
 Red top sent down as save tube

## 2024-05-02 NOTE — ED Notes (Signed)
RN to bedside to introduce self to pt. Pt resting in no distress at this time.

## 2024-05-02 NOTE — ED Triage Notes (Signed)
 Pt presents via EMS c/o SOB and facial swelling. Recently dx with PICC line for abx for UTI per EMS. EMS report 02 saturation in 70s on room air, placed on 6L 02 with improvement to 92%. EMS reports on 02 PRN, unknown exact amount.   Pt reports chronic back pain. Also reports abd pain. Reports has been vomiting for the past 6 days.

## 2024-05-03 DIAGNOSIS — E1142 Type 2 diabetes mellitus with diabetic polyneuropathy: Secondary | ICD-10-CM | POA: Diagnosis present

## 2024-05-03 DIAGNOSIS — K589 Irritable bowel syndrome without diarrhea: Secondary | ICD-10-CM | POA: Insufficient documentation

## 2024-05-03 DIAGNOSIS — J9601 Acute respiratory failure with hypoxia: Secondary | ICD-10-CM | POA: Diagnosis not present

## 2024-05-03 DIAGNOSIS — J9811 Atelectasis: Secondary | ICD-10-CM | POA: Diagnosis present

## 2024-05-03 DIAGNOSIS — J90 Pleural effusion, not elsewhere classified: Secondary | ICD-10-CM | POA: Diagnosis not present

## 2024-05-03 DIAGNOSIS — D631 Anemia in chronic kidney disease: Secondary | ICD-10-CM | POA: Diagnosis present

## 2024-05-03 DIAGNOSIS — Z1152 Encounter for screening for COVID-19: Secondary | ICD-10-CM | POA: Diagnosis not present

## 2024-05-03 DIAGNOSIS — E1165 Type 2 diabetes mellitus with hyperglycemia: Secondary | ICD-10-CM | POA: Diagnosis present

## 2024-05-03 DIAGNOSIS — I130 Hypertensive heart and chronic kidney disease with heart failure and stage 1 through stage 4 chronic kidney disease, or unspecified chronic kidney disease: Secondary | ICD-10-CM | POA: Diagnosis present

## 2024-05-03 DIAGNOSIS — E440 Moderate protein-calorie malnutrition: Secondary | ICD-10-CM | POA: Diagnosis present

## 2024-05-03 DIAGNOSIS — I5033 Acute on chronic diastolic (congestive) heart failure: Secondary | ICD-10-CM | POA: Insufficient documentation

## 2024-05-03 DIAGNOSIS — E039 Hypothyroidism, unspecified: Secondary | ICD-10-CM | POA: Diagnosis present

## 2024-05-03 DIAGNOSIS — R062 Wheezing: Secondary | ICD-10-CM | POA: Diagnosis present

## 2024-05-03 DIAGNOSIS — E1151 Type 2 diabetes mellitus with diabetic peripheral angiopathy without gangrene: Secondary | ICD-10-CM | POA: Diagnosis present

## 2024-05-03 DIAGNOSIS — Z794 Long term (current) use of insulin: Secondary | ICD-10-CM | POA: Diagnosis not present

## 2024-05-03 DIAGNOSIS — E1122 Type 2 diabetes mellitus with diabetic chronic kidney disease: Secondary | ICD-10-CM | POA: Diagnosis present

## 2024-05-03 DIAGNOSIS — J918 Pleural effusion in other conditions classified elsewhere: Secondary | ICD-10-CM | POA: Diagnosis present

## 2024-05-03 DIAGNOSIS — Z6841 Body Mass Index (BMI) 40.0 and over, adult: Secondary | ICD-10-CM | POA: Diagnosis not present

## 2024-05-03 DIAGNOSIS — N049 Nephrotic syndrome with unspecified morphologic changes: Secondary | ICD-10-CM | POA: Diagnosis not present

## 2024-05-03 DIAGNOSIS — Z89511 Acquired absence of right leg below knee: Secondary | ICD-10-CM | POA: Diagnosis not present

## 2024-05-03 DIAGNOSIS — E8779 Other fluid overload: Secondary | ICD-10-CM | POA: Diagnosis not present

## 2024-05-03 DIAGNOSIS — R6510 Systemic inflammatory response syndrome (SIRS) of non-infectious origin without acute organ dysfunction: Secondary | ICD-10-CM | POA: Diagnosis not present

## 2024-05-03 DIAGNOSIS — T782XXA Anaphylactic shock, unspecified, initial encounter: Secondary | ICD-10-CM | POA: Diagnosis present

## 2024-05-03 DIAGNOSIS — J9621 Acute and chronic respiratory failure with hypoxia: Secondary | ICD-10-CM | POA: Diagnosis present

## 2024-05-03 DIAGNOSIS — I10 Essential (primary) hypertension: Secondary | ICD-10-CM

## 2024-05-03 DIAGNOSIS — N179 Acute kidney failure, unspecified: Secondary | ICD-10-CM | POA: Diagnosis present

## 2024-05-03 DIAGNOSIS — A419 Sepsis, unspecified organism: Secondary | ICD-10-CM | POA: Diagnosis not present

## 2024-05-03 DIAGNOSIS — N184 Chronic kidney disease, stage 4 (severe): Secondary | ICD-10-CM | POA: Diagnosis present

## 2024-05-03 DIAGNOSIS — Z66 Do not resuscitate: Secondary | ICD-10-CM | POA: Diagnosis present

## 2024-05-03 DIAGNOSIS — R0602 Shortness of breath: Secondary | ICD-10-CM | POA: Diagnosis not present

## 2024-05-03 DIAGNOSIS — J441 Chronic obstructive pulmonary disease with (acute) exacerbation: Secondary | ICD-10-CM | POA: Diagnosis not present

## 2024-05-03 DIAGNOSIS — I700 Atherosclerosis of aorta: Secondary | ICD-10-CM | POA: Diagnosis present

## 2024-05-03 DIAGNOSIS — E66813 Obesity, class 3: Secondary | ICD-10-CM | POA: Diagnosis present

## 2024-05-03 LAB — BASIC METABOLIC PANEL WITH GFR
Anion gap: 8 (ref 5–15)
BUN: 52 mg/dL — ABNORMAL HIGH (ref 8–23)
CO2: 22 mmol/L (ref 22–32)
Calcium: 7.3 mg/dL — ABNORMAL LOW (ref 8.9–10.3)
Chloride: 105 mmol/L (ref 98–111)
Creatinine, Ser: 4.23 mg/dL — ABNORMAL HIGH (ref 0.61–1.24)
GFR, Estimated: 15 mL/min — ABNORMAL LOW (ref >60)
Glucose, Bld: 403 mg/dL — ABNORMAL HIGH (ref 70–99)
Potassium: 5 mmol/L (ref 3.5–5.1)
Sodium: 135 mmol/L (ref 135–145)

## 2024-05-03 LAB — CBG MONITORING, ED
Glucose-Capillary: 370 mg/dL — ABNORMAL HIGH (ref 70–99)
Glucose-Capillary: 241 mg/dL — ABNORMAL HIGH (ref 70–99)

## 2024-05-03 LAB — CBC
HCT: 28.9 % — ABNORMAL LOW (ref 39.0–52.0)
Hemoglobin: 9.3 g/dL — ABNORMAL LOW (ref 13.0–17.0)
MCH: 29.8 pg (ref 26.0–34.0)
MCHC: 32.2 g/dL (ref 30.0–36.0)
MCV: 92.6 fL (ref 80.0–100.0)
Platelets: 219 K/uL (ref 150–400)
RBC: 3.12 MIL/uL — ABNORMAL LOW (ref 4.22–5.81)
RDW: 12.9 % (ref 11.5–15.5)
WBC: 6.3 K/uL (ref 4.0–10.5)
nRBC: 0 % (ref 0.0–0.2)

## 2024-05-03 LAB — GLUCOSE, CAPILLARY: Glucose-Capillary: 218 mg/dL — ABNORMAL HIGH (ref 70–99)

## 2024-05-03 MED ORDER — ACETAMINOPHEN 325 MG PO TABS
650.0000 mg | ORAL_TABLET | Freq: Four times a day (QID) | ORAL | Status: DC | PRN
  Administered 2024-05-06 – 2024-05-17 (×6): 650 mg via ORAL
  Filled 2024-05-03 (×5): qty 2

## 2024-05-03 MED ORDER — HEPARIN SODIUM (PORCINE) 5000 UNIT/ML IJ SOLN
5000.0000 [IU] | Freq: Three times a day (TID) | INTRAMUSCULAR | Status: DC
  Administered 2024-05-03 – 2024-05-18 (×52): 5000 [IU] via SUBCUTANEOUS
  Filled 2024-05-03 (×43): qty 1

## 2024-05-03 MED ORDER — POLYETHYLENE GLYCOL 3350 17 G PO PACK
17.0000 g | PACK | Freq: Two times a day (BID) | ORAL | Status: DC
  Administered 2024-05-03 – 2024-05-16 (×23): 17 g via ORAL
  Filled 2024-05-03 (×26): qty 1

## 2024-05-03 MED ORDER — ACETAMINOPHEN 650 MG RE SUPP: 650.0000 mg | Freq: Four times a day (QID) | RECTAL | Status: DC | PRN

## 2024-05-03 MED ORDER — FAMOTIDINE IN NACL 20-0.9 MG/50ML-% IV SOLN: 20.0000 mg | Freq: Two times a day (BID) | INTRAVENOUS | Status: DC

## 2024-05-03 MED ORDER — INSULIN ASPART 100 UNIT/ML IJ SOLN
0.0000 [IU] | Freq: Three times a day (TID) | INTRAMUSCULAR | Status: DC
  Administered 2024-05-03: 5 [IU] via SUBCUTANEOUS
  Administered 2024-05-03: 15 [IU] via SUBCUTANEOUS
  Administered 2024-05-04 (×3): 5 [IU] via SUBCUTANEOUS
  Administered 2024-05-04: 3 [IU] via SUBCUTANEOUS
  Administered 2024-05-04: 5 [IU] via SUBCUTANEOUS
  Administered 2024-05-04: 3 [IU] via SUBCUTANEOUS
  Administered 2024-05-05: 5 [IU] via SUBCUTANEOUS
  Administered 2024-05-05: 2 [IU] via SUBCUTANEOUS
  Administered 2024-05-05: 3 [IU] via SUBCUTANEOUS
  Administered 2024-05-05: 2 [IU] via SUBCUTANEOUS
  Administered 2024-05-05: 5 [IU] via SUBCUTANEOUS
  Administered 2024-05-05: 3 [IU] via SUBCUTANEOUS
  Administered 2024-05-06 (×2): 11 [IU] via SUBCUTANEOUS
  Administered 2024-05-06: 15 [IU] via SUBCUTANEOUS
  Administered 2024-05-06 (×2): 11 [IU] via SUBCUTANEOUS
  Administered 2024-05-06: 15 [IU] via SUBCUTANEOUS
  Administered 2024-05-07: 11 [IU] via SUBCUTANEOUS
  Administered 2024-05-07: 8 [IU] via SUBCUTANEOUS
  Administered 2024-05-07: 11 [IU] via SUBCUTANEOUS
  Administered 2024-05-08 (×2): 5 [IU] via SUBCUTANEOUS
  Administered 2024-05-08: 8 [IU] via SUBCUTANEOUS
  Administered 2024-05-09 (×2): 3 [IU] via SUBCUTANEOUS
  Administered 2024-05-09 – 2024-05-10 (×3): 5 [IU] via SUBCUTANEOUS
  Administered 2024-05-10: 8 [IU] via SUBCUTANEOUS
  Administered 2024-05-11 – 2024-05-12 (×2): 2 [IU] via SUBCUTANEOUS
  Administered 2024-05-14: 3 [IU] via SUBCUTANEOUS
  Administered 2024-05-14: 2 [IU] via SUBCUTANEOUS
  Administered 2024-05-15: 5 [IU] via SUBCUTANEOUS
  Administered 2024-05-15: 2 [IU] via SUBCUTANEOUS
  Administered 2024-05-15: 3 [IU] via SUBCUTANEOUS
  Administered 2024-05-16: 2 [IU] via SUBCUTANEOUS
  Administered 2024-05-16: 3 [IU] via SUBCUTANEOUS
  Administered 2024-05-16 – 2024-05-17 (×4): 2 [IU] via SUBCUTANEOUS
  Filled 2024-05-03 (×38): qty 1

## 2024-05-03 MED ORDER — METOCLOPRAMIDE HCL 10 MG PO TABS
5.0000 mg | ORAL_TABLET | Freq: Three times a day (TID) | ORAL | Status: DC
  Administered 2024-05-03 – 2024-05-18 (×52): 5 mg via ORAL
  Filled 2024-05-03 (×45): qty 1

## 2024-05-03 MED ORDER — DOXEPIN HCL 6 MG PO TABS
6.0000 mg | ORAL_TABLET | Freq: Every day | ORAL | Status: DC
Start: 2024-05-03 — End: 2024-05-03

## 2024-05-03 MED ORDER — INSULIN GLARGINE-YFGN 100 UNIT/ML ~~LOC~~ SOLN
10.0000 [IU] | Freq: Two times a day (BID) | SUBCUTANEOUS | Status: DC
  Administered 2024-05-03 – 2024-05-06 (×12): 10 [IU] via SUBCUTANEOUS
  Filled 2024-05-03 (×8): qty 0.1

## 2024-05-03 MED ORDER — OXYCODONE HCL 5 MG PO TABS
10.0000 mg | ORAL_TABLET | Freq: Three times a day (TID) | ORAL | Status: DC | PRN
  Administered 2024-05-03 – 2024-05-17 (×27): 10 mg via ORAL
  Filled 2024-05-03 (×24): qty 2

## 2024-05-03 MED ORDER — ONDANSETRON HCL 4 MG PO TABS
4.0000 mg | ORAL_TABLET | Freq: Four times a day (QID) | ORAL | Status: DC | PRN
  Administered 2024-05-03 – 2024-05-11 (×2): 4 mg via ORAL
  Filled 2024-05-03 (×2): qty 1

## 2024-05-03 MED ORDER — MELATONIN 5 MG PO TABS
10.0000 mg | ORAL_TABLET | Freq: Every day | ORAL | Status: DC
  Administered 2024-05-03 – 2024-05-17 (×18): 10 mg via ORAL
  Filled 2024-05-03 (×15): qty 2

## 2024-05-03 MED ORDER — FUROSEMIDE 10 MG/ML IJ SOLN
60.0000 mg | Freq: Once | INTRAMUSCULAR | Status: AC
  Administered 2024-05-03: 60 mg via INTRAVENOUS
  Filled 2024-05-03: qty 8

## 2024-05-03 MED ORDER — LINAGLIPTIN 5 MG PO TABS
5.0000 mg | ORAL_TABLET | Freq: Every day | ORAL | Status: DC
  Administered 2024-05-03 – 2024-05-18 (×19): 5 mg via ORAL
  Filled 2024-05-03 (×17): qty 1

## 2024-05-03 MED ORDER — LACTULOSE 10 GM/15ML PO SOLN
20.0000 g | Freq: Two times a day (BID) | ORAL | Status: DC
  Administered 2024-05-03 – 2024-05-04 (×6): 20 g via ORAL
  Filled 2024-05-03 (×4): qty 30

## 2024-05-03 MED ORDER — DOXEPIN HCL 6 MG PO TABS: 6.0000 mg | ORAL_TABLET | Freq: Every day | ORAL | Status: DC

## 2024-05-03 MED ORDER — BISACODYL 10 MG RE SUPP: 10.0000 mg | Freq: Every day | RECTAL | Status: DC | PRN

## 2024-05-03 MED ORDER — METOPROLOL SUCCINATE ER 50 MG PO TB24
25.0000 mg | ORAL_TABLET | Freq: Every day | ORAL | Status: DC
  Administered 2024-05-03 – 2024-05-11 (×12): 25 mg via ORAL
  Filled 2024-05-03 (×9): qty 1

## 2024-05-03 MED ORDER — LEVOTHYROXINE SODIUM 100 MCG PO TABS
175.0000 ug | ORAL_TABLET | Freq: Every day | ORAL | Status: DC
  Administered 2024-05-03 – 2024-05-18 (×18): 175 ug via ORAL
  Filled 2024-05-03: qty 2
  Filled 2024-05-03 (×8): qty 1
  Filled 2024-05-03: qty 2
  Filled 2024-05-03 (×6): qty 1

## 2024-05-03 MED ORDER — ENOXAPARIN SODIUM 60 MG/0.6ML IJ SOSY: 60.0000 mg | PREFILLED_SYRINGE | INTRAMUSCULAR | Status: DC

## 2024-05-03 MED ORDER — DIPHENHYDRAMINE HCL 25 MG PO CAPS
25.0000 mg | ORAL_CAPSULE | Freq: Four times a day (QID) | ORAL | Status: DC
  Administered 2024-05-03 – 2024-05-12 (×47): 25 mg via ORAL
  Filled 2024-05-03 (×37): qty 1

## 2024-05-03 MED ORDER — LINACLOTIDE 145 MCG PO CAPS
145.0000 ug | ORAL_CAPSULE | Freq: Every day | ORAL | Status: DC
  Administered 2024-05-03 – 2024-05-18 (×19): 145 ug via ORAL
  Filled 2024-05-03 (×16): qty 1

## 2024-05-03 MED ORDER — METHYLPREDNISOLONE SODIUM SUCC 125 MG IJ SOLR
80.0000 mg | INTRAMUSCULAR | Status: DC
  Administered 2024-05-03: 80 mg via INTRAVENOUS
  Filled 2024-05-03: qty 2

## 2024-05-03 MED ORDER — FAMOTIDINE 20 MG PO TABS
10.0000 mg | ORAL_TABLET | Freq: Every day | ORAL | Status: DC
  Administered 2024-05-03 – 2024-05-18 (×19): 10 mg via ORAL
  Filled 2024-05-03 (×16): qty 1

## 2024-05-03 MED ORDER — ONDANSETRON HCL 4 MG/2ML IJ SOLN
4.0000 mg | Freq: Four times a day (QID) | INTRAMUSCULAR | Status: DC | PRN
  Administered 2024-05-07 – 2024-05-18 (×18): 4 mg via INTRAVENOUS
  Filled 2024-05-03 (×18): qty 2

## 2024-05-03 MED ORDER — GABAPENTIN 300 MG PO CAPS
300.0000 mg | ORAL_CAPSULE | Freq: Every day | ORAL | Status: DC
Start: 2024-05-03 — End: 2024-05-18
  Administered 2024-05-03 – 2024-05-17 (×18): 300 mg via ORAL
  Filled 2024-05-03 (×15): qty 1

## 2024-05-03 MED ORDER — VITAMIN D (ERGOCALCIFEROL) 1.25 MG (50000 UNIT) PO CAPS
50000.0000 [IU] | ORAL_CAPSULE | ORAL | Status: DC
  Administered 2024-05-04 – 2024-05-18 (×4): 50000 [IU] via ORAL
  Filled 2024-05-03 (×4): qty 1

## 2024-05-03 MED ORDER — SODIUM CHLORIDE 0.9 % IV SOLN: INTRAVENOUS | Status: DC

## 2024-05-03 NOTE — Assessment & Plan Note (Signed)
 Continue Synthroid.  Check TSH.

## 2024-05-03 NOTE — Assessment & Plan Note (Signed)
Will continue antihypertensive therapy.

## 2024-05-03 NOTE — ED Notes (Signed)
 Phlebotomy called for blood draw as pt has agreed to allow them to stick him x1

## 2024-05-03 NOTE — H&P (Addendum)
 [place]   PATIENT NAME: Eugene [name]    MR#:  [id number]  DATE OF BIRTH:  12-16-1960  DATE OF ADMISSION:  05/02/2024  PRIMARY CARE PHYSICIAN: Care, [facility]   Patient is coming from: SNF  REQUESTING/REFERRING PHYSICIAN: Clarine Sharper, MD  CHIEF COMPLAINT:   Chief Complaint  Patient presents with   Shortness of Breath   Facial Swelling    HISTORY OF PRESENT ILLNESS:  Eugene [name] is a 63 y.o. male with medical history significant for anxiety, depression, type 2 diabetes mellitus, dyslipidemia, hepatitis C and spinal stenosis as well as IBS, osteomyelitis, status post bilateral BKA, who presented to the emergency room with acute onset of dyspnea with associated facial and periorbital swelling with associated wheezing.  No fever or chills.  The patient was discharged from here on 7/22 after being managed for AKI, catheter associated UTI, hyperkalemia, ileus and acute hypoxic respiratory failure.  The patient denies any chest pain or palpitations.  No paresthesias or focal muscle weakness.  No fever or chills.  No nausea or vomiting or abdominal pain.  He denied tongue swelling.    ED Course: Upon presentation to the ED, temperature was 99.1 and pulse 7093% on 6 L O2 by nasal cannula with otherwise and later 96% - 97% on 4 L O2 by nasal cannula.  Labs revealed blood glucose of 195, BUN 46 and creatinine 4.19 compared to 47/3.74 on 04/30/2024.  Albumin  was 1.8 and total protein 5.6 with calcium  7.2.  High-sensitivity troponin I was 18 and BNP 172.  CBC showed anemia with hemoglobin 8.9 hematocrit 28.3 compared to 9.6 and 30 on 04/30/2024.    EKG as reviewed by me : EKG showed normal sinus rhythm with a rate of 75 with low voltage QRS. Imaging: Portable chest x-ray showed overall unchanged appearance of both pleural effusions and mid and lower lung airspace disease likely atelectasis.  The patient was given 25 mg of IV Benadryl , 20 mg of IV Pepcid , 125 mg of IV Solu-Medrol   and 0.3 mg of IM epinephrine  as well as DuoNeb.  He will be admitted to a progressive unit observation bed for further evaluation and management. PAST MEDICAL HISTORY:   Past Medical History:  Diagnosis Date   Anxiety    Depression    Diabetes mellitus without complication (HCC)    Hepatitis C    Hyperlipidemia    IBS (irritable bowel syndrome)    Osteomyelitis (HCC)    Spinal stenosis     PAST SURGICAL HISTORY:   Past Surgical History:  Procedure Laterality Date   BACK SURGERY     bilateral amputation Bilateral    CENTRAL LINE INSERTION-TUNNELED N/A 02/02/2019   Procedure: CENTRAL LINE INSERTION-TUNNELED;  Surgeon: Marea Selinda RAMAN, MD;  Location: [facility] INVASIVE CV LAB;  Service: Cardiovascular;  Laterality: N/A;   COLONOSCOPY WITH PROPOFOL  N/A 01/12/2020   Procedure: COLONOSCOPY WITH PROPOFOL ;  Surgeon: Jinny Carmine, MD;  Location: [facility] ENDOSCOPY;  Service: Endoscopy;  Laterality: N/A;   ESOPHAGOGASTRODUODENOSCOPY (EGD) WITH PROPOFOL  N/A 12/07/2019   Procedure: ESOPHAGOGASTRODUODENOSCOPY (EGD) WITH PROPOFOL ;  Surgeon: Unk Corinn Skiff, MD;  Location: [facility] ENDOSCOPY;  Service: Gastroenterology;  Laterality: N/A;   IR CATHETER TUBE CHANGE  12/04/2019   SPINAL FUSION     THORACIC SPINE SURGERY  01/2019   extensive washout    SOCIAL HISTORY:   Social History   Tobacco Use   Smoking status: Some Days    Current packs/day: 0.25    Types: Cigarettes  Smokeless tobacco: Never  Substance Use Topics   Alcohol use: Not Currently    FAMILY HISTORY:   Positive for CVA in both parents and ESRD in his brother.  DRUG ALLERGIES:  No Known Allergies  REVIEW OF SYSTEMS:   ROS As per history of present illness. All pertinent systems were reviewed above. Constitutional, HEENT, cardiovascular, respiratory, GI, GU, musculoskeletal, neuro, psychiatric, endocrine, integumentary and hematologic systems were reviewed and are otherwise negative/unremarkable except for positive findings  mentioned above in the HPI.   MEDICATIONS AT HOME:   Prior to Admission medications   Medication Sig Start Date End Date Taking? Authorizing Provider  acetaminophen  (TYLENOL ) 325 MG tablet Take 650 mg by mouth in the morning and at bedtime.   Yes [provider]  bisacodyl  (DULCOLAX) 10 MG suppository Place 10 mg rectally daily as needed for moderate constipation.   Yes [provider]  Doxepin  HCl 6 MG TABS Take 6 mg by mouth at bedtime.   Yes [provider]  furosemide  (LASIX ) 40 MG tablet Take 1 tablet (40 mg total) by mouth daily. 04/25/21  Yes Patel, Sona, MD  gabapentin  (NEURONTIN ) 300 MG capsule Take 300 mg by mouth at bedtime.   Yes [provider]  hydrocortisone  cream 1 % Apply 1 Application topically daily.   Yes [provider]  insulin  glargine-yfgn (SEMGLEE ) 100 UNIT/ML injection Inject 0.1 mLs (10 Units total) into the skin 2 (two) times daily. 05/01/24  Yes Sreenath, Sudheer B, MD  insulin  lispro (HUMALOG ) 100 UNIT/ML injection Inject 0-15 Units into the skin 4 (four) times daily -  before meals and at bedtime. <150= 0 units 151-200= 3 units 201-250= 6 units 251-300= 9 units 301-350= 12 units 351-400= 15 units >400 call MD Patient taking differently: Inject 0-15 Units into the skin 4 (four) times daily -  before meals and at bedtime. <150= 0 units 151-200= 3 units 201-250= 6 units 251-300= 9 units 301-350= 12 units 351-400= 15 units >400 call MD   Yes [provider]  lactulose  (CEPHULAC ) 20 g packet Take 20 g by mouth 2 (two) times daily.   Yes [provider]  levothyroxine  (SYNTHROID ) 175 MCG tablet Take 175 mcg by mouth at bedtime.   Yes [provider]  linaclotide  (LINZESS ) 145 MCG CAPS capsule Take 1 capsule (145 mcg total) by mouth daily before breakfast. 10/25/19  Yes Danford, Lonni SQUIBB, MD  Melatonin 5 MG TABS Take 10 mg by mouth at bedtime.   Yes [provider]  metoCLOPramide   (REGLAN ) 10 MG tablet Take 0.5 tablets (5 mg total) by mouth 3 (three) times daily before meals. 05/01/24  Yes Jhonny Calvin NOVAK, MD  metoprolol  succinate (TOPROL -XL) 25 MG 24 hr tablet Take 25 mg by mouth daily.   Yes [provider]  ondansetron  (ZOFRAN ) 4 MG tablet Take 4 mg by mouth daily.   Yes [provider]  oxyCODONE  (OXY IR/ROXICODONE ) 5 MG immediate release tablet Take 2 tablets (10 mg total) by mouth in the morning, at noon, and at bedtime for 3 days. SNF use only.  Refills per SNF MD 05/01/24 05/04/24 Yes Jhonny, Calvin NOVAK, MD  polyethylene glycol (MIRALAX  / GLYCOLAX ) 17 g packet Take 17 g by mouth 2 (two) times daily. 10/24/19  Yes Danford, Lonni SQUIBB, MD  senna-docusate (SENOKOT-S) 8.6-50 MG tablet Take 1 tablet by mouth 2 (two) times daily. 10/24/19  Yes Danford, Lonni SQUIBB, MD  simethicone  (MYLICON) 80 MG chewable tablet Chew 1 tablet (80 mg  total) by mouth 4 (four) times daily. 05/01/24  Yes Sreenath, Sudheer B, MD  sitaGLIPtin (JANUVIA) 100 MG tablet Take 100 mg by mouth daily.   Yes [provider]  sodium phosphate  Pediatric (FLEET) 3.5-9.5 GM/59ML enema Place 1 enema rectally every other day as needed for severe constipation.   Yes [provider]  pregabalin  (LYRICA ) 75 MG capsule Take 75 mg by mouth 2 (two) times daily. Patient not taking: Reported on 04/17/2021  04/20/21  [provider]      VITAL SIGNS:  Blood pressure 116/60, pulse 83, temperature 98.7 F (37.1 C), resp. rate 16, SpO2 96%.  PHYSICAL EXAMINATION:  Physical Exam  GENERAL:  63 y.o.-year-old Caucasian male patient lying in the bed with no acute distress.  EYES: Pupils equal, round, reactive to light and accommodation. No scleral icterus. Extraocular muscles intact.  He has periorbital swelling more on the left than the right. HEENT: Head atraumatic, normocephalic.  Positive general facial swelling.  No tongue swelling.  Oropharynx and nasopharynx clear.   NECK:  Supple, no jugular venous distention. No thyroid enlargement, no tenderness.  LUNGS: Normal breath sounds bilaterally, no wheezing, rales,rhonchi or crepitation. No use of accessory muscles of respiration.  CARDIOVASCULAR: Regular rate and rhythm, S1, S2 normal. No murmurs, rubs, or gallops.  ABDOMEN: Soft, nondistended, nontender. Bowel sounds present. No organomegaly or mass.  EXTREMITIES: He is s/p bilateral BKA with intact stumps. NEUROLOGIC: Cranial nerves II through XII are intact. Muscle strength 5/5 in all extremities. Sensation intact. Gait not checked.  PSYCHIATRIC: The patient is alert and oriented x 3.  Normal affect and good eye contact. SKIN: No obvious rash, lesion, or ulcer.   LABORATORY PANEL:   CBC Recent Labs  Lab 05/02/24 1926  WBC 6.5  HGB 8.9*  HCT 28.3*  PLT 210   ------------------------------------------------------------------------------------------------------------------  Chemistries  Recent Labs  Lab 04/28/24 0450 04/29/24 0617 05/02/24 1926  NA 139   < > 139  K 3.3*   < > 4.3  CL 107   < > 104  CO2 28   < > 27  GLUCOSE 197*   < > 145*  BUN 58*   < > 46*  CREATININE 3.94*   < > 4.19*  CALCIUM  6.6*   < > 7.2*  MG 1.8  --   --   AST  --   --  16  ALT  --   --  14  ALKPHOS  --   --  48  BILITOT  --   --  0.4   < > = values in this interval not displayed.   ------------------------------------------------------------------------------------------------------------------  Cardiac Enzymes No results for input(s): TROPONINI in the last 168 hours. ------------------------------------------------------------------------------------------------------------------  RADIOLOGY:  DG Chest Portable 1 View Result Date: 05/02/2024 CLINICAL DATA:  acute on chronic hypoxia EXAM: PORTABLE CHEST - 1 VIEW COMPARISON:  April 19, 2024 FINDINGS: Similar small bilateral pleural effusions with adjacent pleural thickening. Patchy airspace consolidation in  the mid and lower lung zones bilaterally, unchanged, possibly atelectasis. No pneumothorax. Moderate cardiomegaly. Tortuous aorta with aortic atherosclerosis. No acute fracture or destructive lesions. Multilevel thoracic osteophytosis. Similar thoracic fusion hardware. IMPRESSION: Overall, unchanged appearance of the bilateral pleural effusions and mid and lower lung airspace disease, likely atelectasis. Electronically Signed   By: Rogelia Myers M.D.   On: 05/02/2024 20:25      IMPRESSION AND PLAN:  Assessment and Plan: * Anaphylaxis - Etiology is unclear.  It is manifested by periorbital and facial swelling  with associated dyspnea and wheezing. -The patient will be admitted to a progressive unit bed. - We will continue him on as needed IM epinephrine . - We will continue on scheduled IV Solu-Medrol  as well as H1 and H2 blocker therapy with IV Benadryl  and Pepcid . - He is currently protecting his airways.  Type 2 diabetes mellitus with peripheral neuropathy (HCC) - The patient will be placed on supplemental coverage with NovoLog . - Will continue basal coverage. - Will continue Neurontin . - Continue Januvia.  Essential hypertension - Will continue antihypertensive therapy.  Hypothyroidism - Continue Synthroid . - Check TSH.  IBS (irritable bowel syndrome) - Will continue Linzess .   DVT prophylaxis: Lovenox .  Advanced Care Planning:  Code Status: DNR only. Family Communication:  The plan of care was discussed in details with the patient (and family). I answered all questions. The patient agreed to proceed with the above mentioned plan. Further management will depend upon hospital course. Disposition Plan: Back to previous home environment Consults called: none. All the records are reviewed and case discussed with ED provider.  Status is: Observation  I certify that at the time of admission, it is my clinical judgment that the patient will require  hospital care extending less  than 2 midnights.                            Dispo: The patient is from: [facility] SNF              Anticipated d/c is to: [facility]              Patient currently is not medically stable to d/c.              Difficult to place patient: No  Madison DELENA Peaches M.D on 05/03/2024 at 4:30 AM  [facility]   From 7 PM-7 AM, contact night-coverage [email or web address]  CC: Primary care physician; Care, [facility]

## 2024-05-03 NOTE — Assessment & Plan Note (Signed)
 Will continue Linzess.

## 2024-05-03 NOTE — ED Notes (Signed)
Pt ate all of his breakfast tray

## 2024-05-03 NOTE — ED Notes (Signed)
 Unable to draw labs from PICC line. Lab tech at bedside to do straight stick. Patient refused. Im not getting poked.

## 2024-05-03 NOTE — ED Notes (Signed)
 Pt was provided with portable phone to place diet order but pt called dietary multiple times and was very angry with them.  Pt phone was removed by charge RN due to this.  Pt is screaming at staff. Pt was given ice water and juice.

## 2024-05-03 NOTE — Assessment & Plan Note (Addendum)
-   Etiology is unclear.  It is manifested by periorbital and facial swelling with associated dyspnea and wheezing. -The patient will be admitted to a progressive unit bed. - We will continue him on as needed IM epinephrine . - We will continue on scheduled IV Solu-Medrol  as well as H1 and H2 blocker therapy with IV Benadryl  and Pepcid . - He is currently protecting his airways.

## 2024-05-03 NOTE — Assessment & Plan Note (Signed)
-   The patient will be placed on supplemental coverage with NovoLog . - Will continue basal coverage. - Will continue Neurontin . - Continue Januvia.

## 2024-05-03 NOTE — ED Notes (Signed)
 Attempted to draw blood from PICC, able to flush without difficulty but no blood return.

## 2024-05-03 NOTE — Progress Notes (Addendum)
 Progress Note    Eugene [name]  FMW:[id number] DOB: 05/27/1961  DOA: 05/02/2024 PCP: [facility], [facility]      Brief Narrative:    Medical records reviewed and are as summarized below:  Eugene [name] is a 63 y.o. male with medical history significant for anxiety, depression, type 2 diabetes mellitus, dyslipidemia, hepatitis C, spinal stenosis, IBS, osteomyelitis, status post bilateral BKA, who presented to the emergency room with acute onset of dyspnea with associated facial and periorbital swelling with associated wheezing. He said he was told by the staff at the nursing home but his face was swollen and he was probably having an allergic reaction.  He is not aware of any recent change in diet or medication that will trigger any allergic reaction.  No fever or chills.  The patient was discharged from here on 7/22 after being managed for AKI, catheter associated UTI, hyperkalemia, ileus and acute hypoxic respiratory failure.    Initial vital signs in the ED temperature 99.1 F, respiratory rate 20, pulse 74, BP 122/82, O2 sat 93% on 6 L of oxygen.  Chest x-ray impression:  Overall, unchanged appearance of the bilateral pleural effusions and mid and lower lung airspace disease, likely atelectasis    He was given 1 dose of EpiPen , IV steroids, IV famotidine  and IV fluids for suspected anaphylaxis.   Assessment/Plan:   Principal Problem:   Anaphylaxis Active Problems:   Type 2 diabetes mellitus with peripheral neuropathy (HCC)   Essential hypertension   Hypothyroidism   IBS (irritable bowel syndrome)   Acute on chronic diastolic CHF (congestive heart failure) (HCC)   CKD (chronic kidney disease) stage 4, GFR 15-29 ml/min (HCC)    Acute hypoxic respiratory failure: He is requiring 4 L/min oxygen via [place].  He was on 6 L on admission.  Wean off oxygen as able.  He was not using oxygen at home.   Suspected anaphylactic reaction: S/p EpiPen , IV Pepcid  and IV  Solu-Medrol .  Difficult to rule in or exclude anaphylactic reaction at this time. Facial swelling was probably due to fluid overload.   Fluid overload, acute on chronic diastolic CHF: Discontinue IV fluids.  Start IV Lasix . BNP 172.1. 2D echo on 04/19/2024 showed EF estimated at 55 to 60%, indeterminate LV diastolic parameters, left and right atrial were not assessed, technically difficult study.   CKD stage IV: Creatinine slowly trending upward.  Suspect this may be his baseline.  Monitor BMP on IV Lasix .   Type II DM with hyperglycemia: IV steroids contributing to hyperglycemia. Continue Lantus .  Start NovoLog  sliding scale. Hemoglobin A1c is 6.0 on 04/14/2024.   Comorbidities include vitamin D  deficiency, peripheral neuropathy, hypertension, hypothyroidism, IBS, bilateral BKA  Recent discharge from the hospital on 05/01/2024 after hospitalization for AKI, catheter associated UTI, suspected pneumonia, acute hypoxic respiratory failure, gastroparesis, ileus, chronic opioid use      Diet Order             Diet heart healthy/carb modified Room service appropriate? Yes; Fluid consistency: Thin  Diet effective now                            Consultants: None  Procedures: None    Medications:    diphenhydrAMINE   25 mg Oral Q6H   Doxepin  HCl  6 mg Oral QHS   famotidine   10 mg Oral Daily   gabapentin   300 mg Oral QHS   heparin  injection (subcutaneous)  5,000  Units Subcutaneous Q8H   insulin  aspart  0-15 Units Subcutaneous TID WC   insulin  glargine-yfgn  10 Units Subcutaneous BID   lactulose   20 g Oral BID   levothyroxine   175 mcg Oral Q0600   linaclotide   145 mcg Oral QAC breakfast   linagliptin   5 mg Oral Daily   melatonin  10 mg Oral QHS   metoCLOPramide   5 mg Oral TID AC   metoprolol  succinate  25 mg Oral Daily   polyethylene glycol  17 g Oral BID   [START ON 05/04/2024] Vitamin D  (Ergocalciferol )  50,000 Units Oral Q7 days   Continuous  Infusions:     Anti-infectives (From admission, onward)    None              Family Communication/Anticipated D/C date and plan/Code Status   DVT prophylaxis: heparin  injection 5,000 Units Start: 05/03/24 2200     Code Status: Do not attempt resuscitation (DNR) PRE-ARREST INTERVENTIONS DESIRED  Family Communication: None Disposition Plan: Plan to discharge to SNF   Status is: Observation The patient will require care spanning > 2 midnights and should be moved to inpatient because: Acute respiratory failure, acute diastolic CHF       Subjective:   Interval events noted.  He complains of low oxygen saturation and feels a little short of breath.  He said he was not aware that his face was swollen but the nursing staff at the nursing home reported that he had a swollen face and were concerned about an allergic reaction.  Objective:    Vitals:   05/03/24 0626 05/03/24 0723 05/03/24 0725 05/03/24 0730  BP:   (!) 142/83 (!) 144/83  Pulse: 84 83 86 77  Resp:  12 19 15   Temp:   98.8 F (37.1 C)   TempSrc:   Oral   SpO2: 98% 91% 93% 95%   No data found.  No intake or output data in the 24 hours ending 05/03/24 1209 There were no vitals filed for this visit.  Exam:   GEN: NAD, appears hypervolemic SKIN: Erythema of upper back EYES: No pallor or icterus ENT: MMM.  No tongue swelling, periorbital swelling, lip swelling no obvious facial swelling.   CV: RRR PULM: CTA B ABD: soft, ND, NT, +BS CNS: AAO x 3, non focal EXT: Edema of bilateral upper and lower extremities.  Bilateral BKA.   GU: Suprapubic catheter draining amber urine      Data Reviewed:   I have personally reviewed following labs and imaging studies:  Labs: Labs show the following:   Basic Metabolic Panel: Recent Labs  Lab 04/27/24 0640 04/28/24 0450 04/29/24 0617 04/30/24 1000 05/02/24 1926 05/03/24 1034  NA 140 139 138 140 139 135  K 3.8 3.3* 3.8 3.8 4.3 5.0  CL 101 107  101 105 104 105  CO2 25 28 27 27 27 22   GLUCOSE 233* 197* 198* 195* 145* 403*  BUN 59* 58* 53* 47* 46* 52*  CREATININE 3.97* 3.94* 3.96* 3.74* 4.19* 4.23*  CALCIUM  7.1* 6.6* 6.9* 6.8* 7.2* 7.3*  MG  --  1.8  --   --   --   --   PHOS 4.3 4.4  --   --   --   --    GFR CrCl cannot be calculated (Unknown ideal weight.). Liver Function Tests: Recent Labs  Lab 04/27/24 0640 04/28/24 0450 05/02/24 1926  AST  --   --  16  ALT  --   --  14  ALKPHOS  --   --  48  BILITOT  --   --  0.4  PROT  --   --  5.6*  ALBUMIN  2.0* 1.7* 1.8*   No results for input(s): LIPASE, AMYLASE in the last 168 hours. No results for input(s): AMMONIA in the last 168 hours. Coagulation profile No results for input(s): INR, PROTIME in the last 168 hours.  CBC: Recent Labs  Lab 04/27/24 0640 04/30/24 1000 05/02/24 1926 05/03/24 1034  WBC 10.1 8.6 6.5 6.3  NEUTROABS  --  6.0 4.3  --   HGB 11.2* 9.6* 8.9* 9.3*  HCT 34.2* 30.0* 28.3* 28.9*  MCV 90.5 92.9 94.6 92.6  PLT 165 188 210 219   Cardiac Enzymes: No results for input(s): CKTOTAL, CKMB, CKMBINDEX, TROPONINI in the last 168 hours. BNP (last 3 results) No results for input(s): PROBNP in the last 8760 hours. CBG: Recent Labs  Lab 04/30/24 1227 04/30/24 1720 04/30/24 2038 05/01/24 0734 05/01/24 1144  GLUCAP 176* 232* 208* 121* 173*   D-Dimer: No results for input(s): DDIMER in the last 72 hours. Hgb A1c: No results for input(s): HGBA1C in the last 72 hours. Lipid Profile: No results for input(s): CHOL, HDL, LDLCALC, TRIG, CHOLHDL, LDLDIRECT in the last 72 hours. Thyroid function studies: No results for input(s): TSH, T4TOTAL, T3FREE, THYROIDAB in the last 72 hours.  Invalid input(s): FREET3 Anemia work up: No results for input(s): VITAMINB12, FOLATE, FERRITIN, TIBC, IRON , RETICCTPCT in the last 72 hours. Sepsis Labs: Recent Labs  Lab 04/27/24 0640 04/30/24 1000 05/02/24 1926  05/03/24 1034  WBC 10.1 8.6 6.5 6.3    Microbiology Recent Results (from the past 240 hours)  Resp panel by RT-PCR (RSV, Flu A&B, Covid) Anterior Nasal Swab     Status: None   Collection Time: 05/02/24  8:06 PM   Specimen: Anterior Nasal Swab  Result Value Ref Range Status   SARS Coronavirus 2 by RT PCR NEGATIVE NEGATIVE Final    Comment: (NOTE) SARS-CoV-2 target nucleic acids are NOT DETECTED.  The SARS-CoV-2 RNA is generally detectable in upper respiratory specimens during the acute phase of infection. The lowest concentration of SARS-CoV-2 viral copies this assay can detect is 138 copies/mL. A negative result does not preclude SARS-Cov-2 infection and should not be used as the sole basis for treatment or other patient management decisions. A negative result may occur with  improper specimen collection/handling, submission of specimen other than nasopharyngeal swab, presence of viral mutation(s) within the areas targeted by this assay, and inadequate number of viral copies(<138 copies/mL). A negative result must be combined with clinical observations, patient history, and epidemiological information. The expected result is Negative.  Fact Sheet for Patients:  BloggerCourse.com  Fact Sheet for Healthcare Providers:  SeriousBroker.it  This test is no t yet approved or cleared by the [place]  FDA and  has been authorized for detection and/or diagnosis of SARS-CoV-2 by FDA under an Emergency Use Authorization (EUA). This EUA will remain  in effect (meaning this test can be used) for the duration of the COVID-19 declaration under Section 564(b)(1) of the Act, 21 U.S.C.section 360bbb-3(b)(1), unless the authorization is terminated  or revoked sooner.       Influenza A by PCR NEGATIVE NEGATIVE Final   Influenza B by PCR NEGATIVE NEGATIVE Final    Comment: (NOTE) The Xpert Xpress SARS-CoV-2/FLU/RSV plus assay is intended  as an aid in the diagnosis of influenza from Nasopharyngeal swab specimens and should not be used as a  sole basis for treatment. Nasal washings and aspirates are unacceptable for Xpert Xpress SARS-CoV-2/FLU/RSV testing.  Fact Sheet for Patients: BloggerCourse.com  Fact Sheet for Healthcare Providers: SeriousBroker.it  This test is not yet approved or cleared by the [place]  FDA and has been authorized for detection and/or diagnosis of SARS-CoV-2 by FDA under an Emergency Use Authorization (EUA). This EUA will remain in effect (meaning this test can be used) for the duration of the COVID-19 declaration under Section 564(b)(1) of the Act, 21 U.S.C. section 360bbb-3(b)(1), unless the authorization is terminated or revoked.     Resp Syncytial Virus by PCR NEGATIVE NEGATIVE Final    Comment: (NOTE) Fact Sheet for Patients: BloggerCourse.com  Fact Sheet for Healthcare Providers: SeriousBroker.it  This test is not yet approved or cleared by the [place]  FDA and has been authorized for detection and/or diagnosis of SARS-CoV-2 by FDA under an Emergency Use Authorization (EUA). This EUA will remain in effect (meaning this test can be used) for the duration of the COVID-19 declaration under Section 564(b)(1) of the Act, 21 U.S.C. section 360bbb-3(b)(1), unless the authorization is terminated or revoked.  Performed at [facility], [address] [place], [place] [address]     Procedures and diagnostic studies:  DG Chest Portable 1 View Result Date: 05/02/2024 CLINICAL DATA:  acute on chronic hypoxia EXAM: PORTABLE CHEST - 1 VIEW COMPARISON:  April 19, 2024 FINDINGS: Similar small bilateral pleural effusions with adjacent pleural thickening. Patchy airspace consolidation in the mid and lower lung zones bilaterally, unchanged, possibly atelectasis. No pneumothorax.  Moderate cardiomegaly. Tortuous aorta with aortic atherosclerosis. No acute fracture or destructive lesions. Multilevel thoracic osteophytosis. Similar thoracic fusion hardware. IMPRESSION: Overall, unchanged appearance of the bilateral pleural effusions and mid and lower lung airspace disease, likely atelectasis. Electronically Signed   By: Rogelia Myers M.D.   On: 05/02/2024 20:25               LOS: 0 days   Daelan Gatt  [facility]   Pager on [email or web address]. If 7PM-7AM, please contact night-coverage at [email or web address]     05/03/2024, 12:09 PM

## 2024-05-03 NOTE — ED Notes (Signed)
 Pt called out for an [place]. This NT asked nurse Leeroy if pt could have one. This NT went to main side to get one for pt. Pt eating italian ice at this time.

## 2024-05-03 NOTE — Progress Notes (Signed)
 PHARMACIST - PHYSICIAN COMMUNICATION  CONCERNING:  Enoxaparin  (Lovenox ) for DVT Prophylaxis    RECOMMENDATION: Patient was prescribed enoxaprin 40mg  q24 hours for VTE prophylaxis.   There were no vitals filed for this visit.  There is no height or weight on file to calculate BMI.  CrCl cannot be calculated (Unknown ideal weight.).   Based on [facility] policy patient is candidate for enoxaparin  0.5mg /kg TBW SQ every 24 hours based on BMI being >30.  DESCRIPTION: Pharmacy has adjusted enoxaparin  dose per [facility] policy.  Patient is now receiving enoxaparin  0.5 mg/kg every 24 hours   Rankin CANDIE Dills, PharmD, [facility] 05/03/2024 12:28 AM

## 2024-05-03 NOTE — ED Notes (Signed)
 Nurse called for lab work. Informed floor phlebotomist, Debie of the need to collect blood work.

## 2024-05-03 NOTE — ED Notes (Signed)
 Pt was able to roll from side to side and soiled linens were removed, pt wiped and new depend was placed.  Pt repositioned in bed and breakfast tray was given.  Pt was provided with phone per request.

## 2024-05-04 ENCOUNTER — Inpatient Hospital Stay

## 2024-05-04 DIAGNOSIS — I5033 Acute on chronic diastolic (congestive) heart failure: Secondary | ICD-10-CM | POA: Diagnosis not present

## 2024-05-04 LAB — RENAL FUNCTION PANEL
Albumin: 1.9 g/dL — ABNORMAL LOW (ref 3.5–5.0)
Anion gap: 8 (ref 5–15)
BUN: 60 mg/dL — ABNORMAL HIGH (ref 8–23)
CO2: 23 mmol/L (ref 22–32)
Calcium: 7.5 mg/dL — ABNORMAL LOW (ref 8.9–10.3)
Chloride: 104 mmol/L (ref 98–111)
Creatinine, Ser: 4.44 mg/dL — ABNORMAL HIGH (ref 0.61–1.24)
GFR, Estimated: 14 mL/min — ABNORMAL LOW (ref >60)
Glucose, Bld: 201 mg/dL — ABNORMAL HIGH (ref 70–99)
Phosphorus: 5.2 mg/dL — ABNORMAL HIGH (ref 2.5–4.6)
Potassium: 5.1 mmol/L (ref 3.5–5.1)
Sodium: 135 mmol/L (ref 135–145)

## 2024-05-04 LAB — GLUCOSE, CAPILLARY
Glucose-Capillary: 178 mg/dL — ABNORMAL HIGH (ref 70–99)
Glucose-Capillary: 219 mg/dL — ABNORMAL HIGH (ref 70–99)
Glucose-Capillary: 203 mg/dL — ABNORMAL HIGH (ref 70–99)
Glucose-Capillary: 265 mg/dL — ABNORMAL HIGH (ref 70–99)

## 2024-05-04 MED ORDER — FUROSEMIDE 10 MG/ML IJ SOLN
40.0000 mg | Freq: Once | INTRAMUSCULAR | Status: AC
  Administered 2024-05-04 (×2): 40 mg via INTRAVENOUS
  Filled 2024-05-04: qty 4

## 2024-05-04 NOTE — Plan of Care (Signed)
  Problem: Education: Goal: Ability to describe self-care measures that may prevent or decrease complications (Diabetes Survival Skills Education) will improve Outcome: Progressing Goal: Individualized Educational Video(s) Outcome: Progressing   Problem: Coping: Goal: Ability to adjust to condition or change in health will improve Outcome: Progressing   Problem: Skin Integrity: Goal: Risk for impaired skin integrity will decrease Outcome: Progressing   Problem: Activity: Goal: Risk for activity intolerance will decrease Outcome: Progressing   Problem: Pain Managment: Goal: General experience of comfort will improve and/or be controlled Outcome: Progressing

## 2024-05-04 NOTE — Progress Notes (Signed)
       CROSS COVER NOTE  NAME: Eugene [name] MRN: [id number] DOB : 1961/03/30    Concern as stated by nurse / staff   hello patient here with fluid overload and aki on ckd. complaining of shortness of breath. bilateral pleural effusions. bp 154/71 map 95 hr 78. 94% on 4L.      Pertinent findings on chart review:   Patient Assessment      05/04/2024    8:23 PM 05/04/2024    3:29 PM 05/04/2024   11:46 AM  Vitals with BMI  Systolic 154 153 842  Diastolic 71 78 99  Pulse 42        Latest Ref Rng & Units 05/04/2024    5:32 AM 05/03/2024   10:34 AM 05/02/2024    7:26 PM  BMP  Glucose 70 - 99 mg/dL 798  596  854   BUN 8 - 23 mg/dL 60  52  46   Creatinine 0.61 - 1.24 mg/dL 5.55  5.76  5.80   Sodium 135 - 145 mmol/L 135  135  139   Potassium 3.5 - 5.1 mmol/L 5.1  5.0  4.3   Chloride 98 - 111 mmol/L 104  105  104   CO2 22 - 32 mmol/L 23  22  27    Calcium  8.9 - 10.3 mg/dL 7.5  7.3  7.2       Assessment and  Interventions   Assessment:  Acute on chronic dyspnea Acute hypoxic respiratory failure Bilateral pleural effusions  Plan: Will update chest x-ray Bibasilar opacities and pleural effusions, questionable improvement on the left Therapeutic trial of Lasix  x 1 Continue supplemental oxygen Can consider thoracentesis.-Will keep n.p.o. just in case Respiratory consult requested to assess for need for BiPAP

## 2024-05-04 NOTE — Progress Notes (Signed)
 Progress Note    Eugene [name]  FMW:[id number] DOB: 01-05-1961  DOA: 05/02/2024 PCP: [facility]      Brief Narrative:    Medical records reviewed and are as summarized below:  Eugene [name] is a 63 y.o. male with medical history significant for anxiety, depression, type 2 diabetes mellitus, dyslipidemia, hepatitis C, spinal stenosis, IBS, osteomyelitis, status post bilateral BKA, who presented to the emergency room with acute onset of dyspnea with associated facial and periorbital swelling with associated wheezing. He said he was told by the staff at the nursing home but his face was swollen and he was probably having an allergic reaction.  He is not aware of any recent change in diet or medication that will trigger any allergic reaction.  No fever or chills.  The patient was discharged from here on 7/22 after being managed for AKI, catheter associated UTI, hyperkalemia, ileus and acute hypoxic respiratory failure.    Initial vital signs in the ED temperature 99.1 F, respiratory rate 20, pulse 74, BP 122/82, O2 sat 93% on 6 L of oxygen.  Chest x-ray impression:  Overall, unchanged appearance of the bilateral pleural effusions and mid and lower lung airspace disease, likely atelectasis    He was given 1 dose of EpiPen , IV steroids, IV famotidine  and IV fluids for suspected anaphylaxis.   Assessment/Plan:   Principal Problem:   Anaphylaxis Active Problems:   Type 2 diabetes mellitus with peripheral neuropathy (HCC)   Essential hypertension   Hypothyroidism   IBS (irritable bowel syndrome)   Acute on chronic diastolic CHF (congestive heart failure) (HCC)   CKD (chronic kidney disease) stage 4, GFR 15-29 ml/min (HCC)    Acute hypoxic respiratory failure: He has been weaned from 4 to 3 L/min oxygen.  Continue weaning attempts.  Wean off oxygen as able.  He may have underlying chronic hypoxic respiratory failure. He was on 6 L on admission. He was not using  oxygen at home.   Suspected anaphylactic reaction: S/p EpiPen  on 05/02/2024, IV Pepcid  and IV Solu-Medrol .  Difficult to rule in or exclude anaphylactic reaction at this time. Facial swelling was probably due to fluid overload.   Fluid overload, acute on chronic diastolic CHF: S/p IV Lasix  x 1 dose on 05/03/2024.   BNP 172.1. 2D echo on 04/19/2024 showed EF estimated at 55 to 60%, indeterminate LV diastolic parameters, left and right atrial were not assessed, technically difficult study.   Hypoalbuminemia likely contributing to fluid overload.  Albumin  1.9. Probable AKI on CKD stage IV versus progressive CKD stage IV: Creatinine slowly trending upward.  Consulted Dr. Marcelino, nephrologist, to assist with management.    Type II DM with hyperglycemia: Glucose levels are better today.  IV steroids likely contributed to severe hyperglycemia. Continue Lantus  and NovoLog  sliding scale. Hemoglobin A1c is 6.0 on 04/14/2024.   Comorbidities include vitamin D  deficiency, peripheral neuropathy, hypertension, hypothyroidism, IBS, bilateral BKA   Recent discharge from the hospital on 05/01/2024 after hospitalization for AKI, catheter associated UTI, suspected pneumonia, acute hypoxic respiratory failure, gastroparesis, ileus, chronic opioid use      Diet Order             Diet heart healthy/carb modified Room service appropriate? Yes; Fluid consistency: Thin  Diet effective now                            Consultants: Nephrologist  Procedures: None    Medications:  diphenhydrAMINE   25 mg Oral Q6H   famotidine   10 mg Oral Daily   gabapentin   300 mg Oral QHS   heparin  injection (subcutaneous)  5,000 Units Subcutaneous Q8H   insulin  aspart  0-15 Units Subcutaneous TID WC   insulin  glargine-yfgn  10 Units Subcutaneous BID   lactulose   20 g Oral BID   levothyroxine   175 mcg Oral Q0600   linaclotide   145 mcg Oral QAC breakfast   linagliptin   5 mg Oral Daily   melatonin   10 mg Oral QHS   metoCLOPramide   5 mg Oral TID AC   metoprolol  succinate  25 mg Oral Daily   polyethylene glycol  17 g Oral BID   Vitamin D  (Ergocalciferol )  50,000 Units Oral Q7 days   Continuous Infusions:     Anti-infectives (From admission, onward)    None              Family Communication/Anticipated D/C date and plan/Code Status   DVT prophylaxis: heparin  injection 5,000 Units Start: 05/03/24 2200     Code Status: Do not attempt resuscitation (DNR) PRE-ARREST INTERVENTIONS DESIRED  Family Communication: None Disposition Plan: Plan to discharge to SNF   Status is: Inpatient Remains inpatient appropriate because: AKI, acute respiratory failure         Subjective:   Interval events noted.  He complains of low oxygen saturation and feels a little short of breath.  He said he was not aware that his face was swollen but the nursing staff at the nursing home reported that he had a swollen face and were concerned about an allergic reaction.  Objective:    Vitals:   05/03/24 2039 05/03/24 2308 05/04/24 0325 05/04/24 0733  BP:  129/88 (!) 155/73 (!) 143/77  Pulse:      Resp:  20 16 18   Temp:  98.6 F (37 C) 98.6 F (37 C) 98 F (36.7 C)  TempSrc:  Axillary    SpO2:  91% 93% 97%  Weight: (!) 137.6 kg     Height: 5' (1.524 m)      No data found.   Intake/Output Summary (Last 24 hours) at 05/04/2024 0926 Last data filed at 05/04/2024 0401 Gross per 24 hour  Intake --  Output 1750 ml  Net -1750 ml   Filed Weights   05/03/24 2039  Weight: (!) 137.6 kg    Exam:   GEN: NAD, appears hypervolemic SKIN: Erythema of upper back EYES: No pallor or icterus ENT: MMM.  No tongue swelling, periorbital swelling, lip swelling no obvious facial swelling.   CV: RRR PULM: CTA B ABD: soft, ND, NT, +BS CNS: AAO x 3, non focal EXT: Edema of bilateral upper and lower extremities.  Bilateral BKA.   GU: Suprapubic catheter draining amber urine       Data Reviewed:   I have personally reviewed following labs and imaging studies:  Labs: Labs show the following:   Basic Metabolic Panel: Recent Labs  Lab 04/28/24 0450 04/29/24 0617 04/30/24 1000 05/02/24 1926 05/03/24 1034 05/04/24 0532  NA 139 138 140 139 135 135  K 3.3* 3.8 3.8 4.3 5.0 5.1  CL 107 101 105 104 105 104  CO2 28 27 27 27 22 23   GLUCOSE 197* 198* 195* 145* 403* 201*  BUN 58* 53* 47* 46* 52* 60*  CREATININE 3.94* 3.96* 3.74* 4.19* 4.23* 4.44*  CALCIUM  6.6* 6.9* 6.8* 7.2* 7.3* 7.5*  MG 1.8  --   --   --   --   --  PHOS 4.4  --   --   --   --  5.2*   GFR Estimated Creatinine Clearance: 21 mL/min (A) (by C-G formula based on SCr of 4.44 mg/dL (H)). Liver Function Tests: Recent Labs  Lab 04/28/24 0450 05/02/24 1926 05/04/24 0532  AST  --  16  --   ALT  --  14  --   ALKPHOS  --  48  --   BILITOT  --  0.4  --   PROT  --  5.6*  --   ALBUMIN  1.7* 1.8* 1.9*   No results for input(s): LIPASE, AMYLASE in the last 168 hours. No results for input(s): AMMONIA in the last 168 hours. Coagulation profile No results for input(s): INR, PROTIME in the last 168 hours.  CBC: Recent Labs  Lab 04/30/24 1000 05/02/24 1926 05/03/24 1034  WBC 8.6 6.5 6.3  NEUTROABS 6.0 4.3  --   HGB 9.6* 8.9* 9.3*  HCT 30.0* 28.3* 28.9*  MCV 92.9 94.6 92.6  PLT 188 210 219   Cardiac Enzymes: No results for input(s): CKTOTAL, CKMB, CKMBINDEX, TROPONINI in the last 168 hours. BNP (last 3 results) No results for input(s): PROBNP in the last 8760 hours. CBG: Recent Labs  Lab 05/01/24 1144 05/03/24 1212 05/03/24 1703 05/03/24 2053 05/04/24 0735  GLUCAP 173* 370* 241* 218* 178*   D-Dimer: No results for input(s): DDIMER in the last 72 hours. Hgb A1c: No results for input(s): HGBA1C in the last 72 hours. Lipid Profile: No results for input(s): CHOL, HDL, LDLCALC, TRIG, CHOLHDL, LDLDIRECT in the last 72 hours. Thyroid function  studies: No results for input(s): TSH, T4TOTAL, T3FREE, THYROIDAB in the last 72 hours.  Invalid input(s): FREET3 Anemia work up: No results for input(s): VITAMINB12, FOLATE, FERRITIN, TIBC, IRON , RETICCTPCT in the last 72 hours. Sepsis Labs: Recent Labs  Lab 04/30/24 1000 05/02/24 1926 05/03/24 1034  WBC 8.6 6.5 6.3    Microbiology Recent Results (from the past 240 hours)  Resp panel by RT-PCR (RSV, Flu A&B, Covid) Anterior Nasal Swab     Status: None   Collection Time: 05/02/24  8:06 PM   Specimen: Anterior Nasal Swab  Result Value Ref Range Status   SARS Coronavirus 2 by RT PCR NEGATIVE NEGATIVE Final    Comment: (NOTE) SARS-CoV-2 target nucleic acids are NOT DETECTED.  The SARS-CoV-2 RNA is generally detectable in upper respiratory specimens during the acute phase of infection. The lowest concentration of SARS-CoV-2 viral copies this assay can detect is 138 copies/mL. A negative result does not preclude SARS-Cov-2 infection and should not be used as the sole basis for treatment or other patient management decisions. A negative result may occur with  improper specimen collection/handling, submission of specimen other than nasopharyngeal swab, presence of viral mutation(s) within the areas targeted by this assay, and inadequate number of viral copies(<138 copies/mL). A negative result must be combined with clinical observations, patient history, and epidemiological information. The expected result is Negative.  Fact Sheet for Patients:  BloggerCourse.com  Fact Sheet for Healthcare Providers:  SeriousBroker.it  This test is no t yet approved or cleared by the [place]  FDA and  has been authorized for detection and/or diagnosis of SARS-CoV-2 by FDA under an Emergency Use Authorization (EUA). This EUA will remain  in effect (meaning this test can be used) for the duration of the COVID-19  declaration under Section 564(b)(1) of the Act, 21 U.S.C.section 360bbb-3(b)(1), unless the authorization is terminated  or revoked sooner.  Influenza A by PCR NEGATIVE NEGATIVE Final   Influenza B by PCR NEGATIVE NEGATIVE Final    Comment: (NOTE) The Xpert Xpress SARS-CoV-2/FLU/RSV plus assay is intended as an aid in the diagnosis of influenza from Nasopharyngeal swab specimens and should not be used as a sole basis for treatment. Nasal washings and aspirates are unacceptable for Xpert Xpress SARS-CoV-2/FLU/RSV testing.  Fact Sheet for Patients: BloggerCourse.com  Fact Sheet for Healthcare Providers: SeriousBroker.it  This test is not yet approved or cleared by the [place]  FDA and has been authorized for detection and/or diagnosis of SARS-CoV-2 by FDA under an Emergency Use Authorization (EUA). This EUA will remain in effect (meaning this test can be used) for the duration of the COVID-19 declaration under Section 564(b)(1) of the Act, 21 U.S.C. section 360bbb-3(b)(1), unless the authorization is terminated or revoked.     Resp Syncytial Virus by PCR NEGATIVE NEGATIVE Final    Comment: (NOTE) Fact Sheet for Patients: BloggerCourse.com  Fact Sheet for Healthcare Providers: SeriousBroker.it  This test is not yet approved or cleared by the [place]  FDA and has been authorized for detection and/or diagnosis of SARS-CoV-2 by FDA under an Emergency Use Authorization (EUA). This EUA will remain in effect (meaning this test can be used) for the duration of the COVID-19 declaration under Section 564(b)(1) of the Act, 21 U.S.C. section 360bbb-3(b)(1), unless the authorization is terminated or revoked.  Performed at [facility], [address] [place], [place] [address]     Procedures and diagnostic studies:  DG Chest Portable 1 View Result Date:  05/02/2024 CLINICAL DATA:  acute on chronic hypoxia EXAM: PORTABLE CHEST - 1 VIEW COMPARISON:  April 19, 2024 FINDINGS: Similar small bilateral pleural effusions with adjacent pleural thickening. Patchy airspace consolidation in the mid and lower lung zones bilaterally, unchanged, possibly atelectasis. No pneumothorax. Moderate cardiomegaly. Tortuous aorta with aortic atherosclerosis. No acute fracture or destructive lesions. Multilevel thoracic osteophytosis. Similar thoracic fusion hardware. IMPRESSION: Overall, unchanged appearance of the bilateral pleural effusions and mid and lower lung airspace disease, likely atelectasis. Electronically Signed   By: Rogelia Myers M.D.   On: 05/02/2024 20:25               LOS: 1 day   Eugene [name]  [facility]   Pager on [email or web address]. If 7PM-7AM, please contact night-coverage at [email or web address]     05/04/2024, 9:26 AM

## 2024-05-04 NOTE — Progress Notes (Signed)
 [facility]  ROUNDING NOTE   Subjective:   Eugene [name]  is a 63 y.o.  male  with past medical conditions including diabetes, bilateral BKA, suprapubic catheter, and hepatitis C. Patient presents to ED from SNF with facial edema and has been admitted for Wheezing [R06.2] Anaphylaxis [T78.2XXA] Facial swelling [R22.0] AKI (acute kidney injury) (HCC) [N17.9] Acute on chronic respiratory failure with hypoxia (HCC) [J96.21]  Patient is known to our practice from recent admission. He was released and pending follow up appt in our office at discharge. He reports he began experiencing some shortness of breath. Staff noticed patient had facial swelling. Patient is a poor historian, history obtained via chart review.   Arrival concerning for ACS, creatinine 4.4 obtained, BNP 72 troponins 18 and hemoglobin 8.9.  Respiratory panel negative for influenza, COVID-19, and RSV.  Chest x-ray shows bilateral pleural effusion with lower lung airspace disease, similar to previous imaging. Currently taking  We have been consulted to monitor for acute kidney injury.    Objective:  Vital signs in last 24 hours:  Temp:  [98 F (36.7 C)-99.3 F (37.4 C)] 98.2 F (36.8 C) (08/11 1146) Pulse Rate:  [73-100] 100 (08/10 2035) Resp:  [16-20] 20 (08/11 1146) BP: (116-157)/(62-99) 157/99 (08/11 1146) SpO2:  [91 %-99 %] 93 % (08/11 1146) Weight:  [137.6 kg] 137.6 kg (08/10 2039)  Weight change:  Filed Weights   05/03/24 2039  Weight: (!) 137.6 kg    Intake/Output: I/O last 3 completed shifts: In: -  Out: 1750 [Urine:1750]   Intake/Output this shift:  No intake/output data recorded.  Physical Exam: General: NAD  Head: Normocephalic  Eyes: Anicteric  Neck: Supple  Lungs:  Diminished, 3LNC  Heart: Regular rate   Abdomen:  Distended and firm  Extremities: Trace peripheral edema.,  Bilateral BKA  Neurologic: Awake and alert  Skin: No rashes  Access: None  Suprapubic catheter in  place  Basic Metabolic Panel: Recent Labs  Lab 04/28/24 0450 04/29/24 0617 04/30/24 1000 05/02/24 1926 05/03/24 1034 05/04/24 0532  NA 139 138 140 139 135 135  K 3.3* 3.8 3.8 4.3 5.0 5.1  CL 107 101 105 104 105 104  CO2 28 27 27 27 22 23   GLUCOSE 197* 198* 195* 145* 403* 201*  BUN 58* 53* 47* 46* 52* 60*  CREATININE 3.94* 3.96* 3.74* 4.19* 4.23* 4.44*  CALCIUM  6.6* 6.9* 6.8* 7.2* 7.3* 7.5*  MG 1.8  --   --   --   --   --   PHOS 4.4  --   --   --   --  5.2*    Liver Function Tests: Recent Labs  Lab 04/28/24 0450 05/02/24 1926 05/04/24 0532  AST  --  16  --   ALT  --  14  --   ALKPHOS  --  48  --   BILITOT  --  0.4  --   PROT  --  5.6*  --   ALBUMIN  1.7* 1.8* 1.9*   No results for input(s): LIPASE, AMYLASE in the last 168 hours. No results for input(s): AMMONIA in the last 168 hours.  CBC: Recent Labs  Lab 04/30/24 1000 05/02/24 1926 05/03/24 1034  WBC 8.6 6.5 6.3  NEUTROABS 6.0 4.3  --   HGB 9.6* 8.9* 9.3*  HCT 30.0* 28.3* 28.9*  MCV 92.9 94.6 92.6  PLT 188 210 219    Cardiac Enzymes: No results for input(s): CKTOTAL, CKMB, CKMBINDEX, TROPONINI in the last 168 hours.  BNP: Invalid input(s): POCBNP  CBG: Recent Labs  Lab 05/03/24 1212 05/03/24 1703 05/03/24 2053 05/04/24 0735 05/04/24 1148  GLUCAP 370* 241* 218* 178* 219*    Microbiology: Results for orders placed or performed during the hospital encounter of 05/02/24  Resp panel by RT-PCR (RSV, Flu A&B, Covid) Anterior Nasal Swab     Status: None   Collection Time: 05/02/24  8:06 PM   Specimen: Anterior Nasal Swab  Result Value Ref Range Status   SARS Coronavirus 2 by RT PCR NEGATIVE NEGATIVE Final    Comment: (NOTE) SARS-CoV-2 target nucleic acids are NOT DETECTED.  The SARS-CoV-2 RNA is generally detectable in upper respiratory specimens during the acute phase of infection. The lowest concentration of SARS-CoV-2 viral copies this assay can detect is 138 copies/mL. A  negative result does not preclude SARS-Cov-2 infection and should not be used as the sole basis for treatment or other patient management decisions. A negative result may occur with  improper specimen collection/handling, submission of specimen other than nasopharyngeal swab, presence of viral mutation(s) within the areas targeted by this assay, and inadequate number of viral copies(<138 copies/mL). A negative result must be combined with clinical observations, patient history, and epidemiological information. The expected result is Negative.  Fact Sheet for Patients:  BloggerCourse.com  Fact Sheet for Healthcare Providers:  SeriousBroker.it  This test is no t yet approved or cleared by the [place]  FDA and  has been authorized for detection and/or diagnosis of SARS-CoV-2 by FDA under an Emergency Use Authorization (EUA). This EUA will remain  in effect (meaning this test can be used) for the duration of the COVID-19 declaration under Section 564(b)(1) of the Act, 21 U.S.C.section 360bbb-3(b)(1), unless the authorization is terminated  or revoked sooner.       Influenza A by PCR NEGATIVE NEGATIVE Final   Influenza B by PCR NEGATIVE NEGATIVE Final    Comment: (NOTE) The Xpert Xpress SARS-CoV-2/FLU/RSV plus assay is intended as an aid in the diagnosis of influenza from Nasopharyngeal swab specimens and should not be used as a sole basis for treatment. Nasal washings and aspirates are unacceptable for Xpert Xpress SARS-CoV-2/FLU/RSV testing.  Fact Sheet for Patients: BloggerCourse.com  Fact Sheet for Healthcare Providers: SeriousBroker.it  This test is not yet approved or cleared by the [place]  FDA and has been authorized for detection and/or diagnosis of SARS-CoV-2 by FDA under an Emergency Use Authorization (EUA). This EUA will remain in effect (meaning this test can  be used) for the duration of the COVID-19 declaration under Section 564(b)(1) of the Act, 21 U.S.C. section 360bbb-3(b)(1), unless the authorization is terminated or revoked.     Resp Syncytial Virus by PCR NEGATIVE NEGATIVE Final    Comment: (NOTE) Fact Sheet for Patients: BloggerCourse.com  Fact Sheet for Healthcare Providers: SeriousBroker.it  This test is not yet approved or cleared by the [place]  FDA and has been authorized for detection and/or diagnosis of SARS-CoV-2 by FDA under an Emergency Use Authorization (EUA). This EUA will remain in effect (meaning this test can be used) for the duration of the COVID-19 declaration under Section 564(b)(1) of the Act, 21 U.S.C. section 360bbb-3(b)(1), unless the authorization is terminated or revoked.  Performed at [facility], [address] [place], [place] [address]     Coagulation Studies: No results for input(s): LABPROT, INR in the last 72 hours.  Urinalysis: No results for input(s): COLORURINE, LABSPEC, PHURINE, GLUCOSEU, HGBUR, BILIRUBINUR, KETONESUR, PROTEINUR, UROBILINOGEN, NITRITE, LEUKOCYTESUR in the last 72  hours.  Invalid input(s): APPERANCEUR    Imaging: DG Chest Portable 1 View Result Date: 05/02/2024 CLINICAL DATA:  acute on chronic hypoxia EXAM: PORTABLE CHEST - 1 VIEW COMPARISON:  April 19, 2024 FINDINGS: Similar small bilateral pleural effusions with adjacent pleural thickening. Patchy airspace consolidation in the mid and lower lung zones bilaterally, unchanged, possibly atelectasis. No pneumothorax. Moderate cardiomegaly. Tortuous aorta with aortic atherosclerosis. No acute fracture or destructive lesions. Multilevel thoracic osteophytosis. Similar thoracic fusion hardware. IMPRESSION: Overall, unchanged appearance of the bilateral pleural effusions and mid and lower lung airspace disease, likely atelectasis.  Electronically Signed   By: Rogelia Myers M.D.   On: 05/02/2024 20:25     Medications:      diphenhydrAMINE   25 mg Oral Q6H   famotidine   10 mg Oral Daily   gabapentin   300 mg Oral QHS   heparin  injection (subcutaneous)  5,000 Units Subcutaneous Q8H   insulin  aspart  0-15 Units Subcutaneous TID WC   insulin  glargine-yfgn  10 Units Subcutaneous BID   lactulose   20 g Oral BID   levothyroxine   175 mcg Oral Q0600   linaclotide   145 mcg Oral QAC breakfast   linagliptin   5 mg Oral Daily   melatonin  10 mg Oral QHS   metoCLOPramide   5 mg Oral TID AC   metoprolol  succinate  25 mg Oral Daily   polyethylene glycol  17 g Oral BID   Vitamin D  (Ergocalciferol )  50,000 Units Oral Q7 days   acetaminophen  **OR** acetaminophen , bisacodyl , ondansetron  **OR** ondansetron  (ZOFRAN ) IV, oxyCODONE   Assessment/ Plan:  Mr. Nimesh Riolo is a 63 y.o.  male  with past medical conditions including diabetes, bilateral BKA, suprapubic catheter, hepatitis C, who was admitted to [facility] on 04/14/2024 for Suprapubic catheter (HCC) [Z93.59] AKI (acute kidney injury) (HCC) [N17.9] Acute kidney injury (HCC) [N17.9]    Acute kidney injury with proteinuria chronic kidney disease stage IV, creatinine 3.74 at time of previous discharge.  Creatinine 4.19 on admission.  Etiology of AKI unclear at this time.  Concerning for progression of kidney.  No acute indication for dialysis.  Will continue for now.  Patient encouraged to maintain oral intake.  Can consider IV hydration if needed   Lab Results  Component Value Date   CREATININE 4.44 (H) 05/04/2024   CREATININE 4.23 (H) 05/03/2024   CREATININE 4.19 (H) 05/02/2024     Intake/Output Summary (Last 24 hours) at 05/04/2024 1249 Last data filed at 05/04/2024 0401 Gross per 24 hour  Intake --  Output 950 ml  Net -950 ml       2.  Hypertension with chronic kidney disease.  Currently prescribed amlodipine  and metoprolol .  Blood pressure is elevated, 157/99.  3.   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 Urine protein to creatinine ratio of 15.4 via Foley specimen.  Proteinuria likely secondary to diabetic nephropathy.  Hemoglobin A1c of 6.0% from 04/14/2024.  Previously diabetes control was suboptimal.    4. Anemia of chronic kidney disease Lab Results  Component Value Date   HGB 9.3 (L) 05/03/2024    Hemoglobin monitoring.  Will consider ESA at a later time.   LOS: 1 Chrishawna Farina 8/11/202512:49 PM

## 2024-05-05 ENCOUNTER — Inpatient Hospital Stay

## 2024-05-05 DIAGNOSIS — E8779 Other fluid overload: Secondary | ICD-10-CM

## 2024-05-05 LAB — RENAL FUNCTION PANEL
Albumin: 1.9 g/dL — ABNORMAL LOW (ref 3.5–5.0)
Anion gap: 8 (ref 5–15)
BUN: 64 mg/dL — ABNORMAL HIGH (ref 8–23)
CO2: 22 mmol/L (ref 22–32)
Calcium: 7.4 mg/dL — ABNORMAL LOW (ref 8.9–10.3)
Chloride: 107 mmol/L (ref 98–111)
Creatinine, Ser: 4.44 mg/dL — ABNORMAL HIGH (ref 0.61–1.24)
GFR, Estimated: 14 mL/min — ABNORMAL LOW (ref >60)
Glucose, Bld: 203 mg/dL — ABNORMAL HIGH (ref 70–99)
Phosphorus: 5.2 mg/dL — ABNORMAL HIGH (ref 2.5–4.6)
Potassium: 4.9 mmol/L (ref 3.5–5.1)
Sodium: 137 mmol/L (ref 135–145)

## 2024-05-05 LAB — BODY FLUID CELL COUNT WITH DIFFERENTIAL
Eos, Fluid: 0 %
Lymphs, Fluid: 88 %
Monocyte-Macrophage-Serous Fluid: 3 %
Neutrophil Count, Fluid: 9 %
Total Nucleated Cell Count, Fluid: 106 uL

## 2024-05-05 LAB — GLUCOSE, CAPILLARY
Glucose-Capillary: 162 mg/dL — ABNORMAL HIGH (ref 70–99)
Glucose-Capillary: 127 mg/dL — ABNORMAL HIGH (ref 70–99)
Glucose-Capillary: 229 mg/dL — ABNORMAL HIGH (ref 70–99)
Glucose-Capillary: 337 mg/dL — ABNORMAL HIGH (ref 70–99)

## 2024-05-05 LAB — LACTATE DEHYDROGENASE, PLEURAL OR PERITONEAL FLUID: LD, Fluid: 63 U/L — ABNORMAL HIGH (ref 3–23)

## 2024-05-05 LAB — PROTEIN, PLEURAL OR PERITONEAL FLUID: Total protein, fluid: <3 g/dL

## 2024-05-05 LAB — C-REACTIVE PROTEIN: CRP: 3.9 mg/dL — ABNORMAL HIGH (ref <1.0)

## 2024-05-05 LAB — FOLATE: Folate: 6.9 ng/mL (ref >5.9)

## 2024-05-05 LAB — SEDIMENTATION RATE: Sed Rate: 65 mm/h — ABNORMAL HIGH (ref 0–20)

## 2024-05-05 MED ORDER — BISACODYL 5 MG PO TBEC
10.0000 mg | DELAYED_RELEASE_TABLET | Freq: Every day | ORAL | Status: DC
  Administered 2024-05-05 – 2024-05-16 (×10): 10 mg via ORAL
  Filled 2024-05-05 (×11): qty 2

## 2024-05-05 MED ORDER — LIDOCAINE HCL (PF) 1 % IJ SOLN
10.0000 mL | Freq: Once | INTRAMUSCULAR | Status: AC
  Administered 2024-05-05 (×2): 10 mL

## 2024-05-05 MED ORDER — AMLODIPINE BESYLATE 10 MG PO TABS
10.0000 mg | ORAL_TABLET | Freq: Every day | ORAL | Status: DC
  Administered 2024-05-05 (×2): 10 mg via ORAL
  Filled 2024-05-05: qty 1

## 2024-05-05 MED ORDER — SPIRONOLACTONE 25 MG PO TABS
25.0000 mg | ORAL_TABLET | Freq: Every day | ORAL | Status: DC
  Administered 2024-05-05 (×2): 25 mg via ORAL
  Filled 2024-05-05: qty 1

## 2024-05-05 MED ORDER — FUROSEMIDE 10 MG/ML IJ SOLN
80.0000 mg | Freq: Every day | INTRAMUSCULAR | Status: DC
  Administered 2024-05-05 – 2024-05-06 (×4): 80 mg via INTRAVENOUS
  Filled 2024-05-05 (×2): qty 8

## 2024-05-05 MED ORDER — BISACODYL 5 MG PO TBEC
10.0000 mg | DELAYED_RELEASE_TABLET | Freq: Once | ORAL | Status: AC
  Administered 2024-05-05 (×2): 10 mg via ORAL
  Filled 2024-05-05: qty 2

## 2024-05-05 MED ORDER — CHLORHEXIDINE GLUCONATE CLOTH 2 % EX PADS
6.0000 | MEDICATED_PAD | Freq: Every day | CUTANEOUS | Status: DC
  Administered 2024-05-05 – 2024-05-12 (×9): 6 via TOPICAL

## 2024-05-05 MED ORDER — VITAMIN B-12 1000 MCG PO TABS
1000.0000 ug | ORAL_TABLET | Freq: Every day | ORAL | Status: DC
  Administered 2024-05-05 – 2024-05-18 (×16): 1000 ug via ORAL
  Filled 2024-05-05 (×15): qty 1

## 2024-05-05 MED ORDER — IOHEXOL 9 MG/ML PO SOLN
500.0000 mL | ORAL | Status: AC
  Administered 2024-05-05 (×2): 500 mL via ORAL

## 2024-05-05 MED ORDER — IPRATROPIUM-ALBUTEROL 0.5-2.5 (3) MG/3ML IN SOLN: 3.0000 mL | Freq: Four times a day (QID) | RESPIRATORY_TRACT | Status: DC | PRN

## 2024-05-05 MED ORDER — HYDRALAZINE HCL 50 MG PO TABS: 50.0000 mg | ORAL_TABLET | Freq: Four times a day (QID) | ORAL | Status: DC | PRN

## 2024-05-05 MED ORDER — PREDNISONE 20 MG PO TABS
40.0000 mg | ORAL_TABLET | Freq: Every day | ORAL | Status: DC
  Administered 2024-05-06 – 2024-05-07 (×3): 40 mg via ORAL
  Filled 2024-05-05 (×2): qty 2

## 2024-05-05 MED ORDER — ACETYLCYSTEINE 20 % IN SOLN
4.0000 mL | Freq: Two times a day (BID) | RESPIRATORY_TRACT | Status: DC
  Filled 2024-05-05 (×9): qty 4

## 2024-05-05 MED ORDER — IPRATROPIUM-ALBUTEROL 0.5-2.5 (3) MG/3ML IN SOLN
3.0000 mL | Freq: Four times a day (QID) | RESPIRATORY_TRACT | Status: DC
  Administered 2024-05-05 (×2): 3 mL via RESPIRATORY_TRACT
  Filled 2024-05-05: qty 3

## 2024-05-05 MED ORDER — ARFORMOTEROL TARTRATE 15 MCG/2ML IN NEBU
15.0000 ug | INHALATION_SOLUTION | Freq: Two times a day (BID) | RESPIRATORY_TRACT | Status: DC
  Administered 2024-05-05 – 2024-05-18 (×25): 15 ug via RESPIRATORY_TRACT
  Filled 2024-05-05 (×30): qty 2

## 2024-05-05 MED ORDER — METHYLPREDNISOLONE SODIUM SUCC 40 MG IJ SOLR
40.0000 mg | Freq: Two times a day (BID) | INTRAMUSCULAR | Status: AC
  Administered 2024-05-05 (×4): 40 mg via INTRAVENOUS
  Filled 2024-05-05 (×2): qty 1

## 2024-05-05 MED ORDER — BISACODYL 10 MG RE SUPP
10.0000 mg | Freq: Every day | RECTAL | Status: DC | PRN
  Administered 2024-05-06 (×2): 10 mg via RECTAL
  Filled 2024-05-05 (×2): qty 1

## 2024-05-05 MED ORDER — HYDRALAZINE HCL 20 MG/ML IJ SOLN
10.0000 mg | Freq: Four times a day (QID) | INTRAMUSCULAR | Status: DC | PRN
  Administered 2024-05-09 (×2): 10 mg via INTRAVENOUS
  Filled 2024-05-05 (×2): qty 1

## 2024-05-05 MED ORDER — IPRATROPIUM-ALBUTEROL 0.5-2.5 (3) MG/3ML IN SOLN
3.0000 mL | Freq: Four times a day (QID) | RESPIRATORY_TRACT | Status: DC
  Administered 2024-05-05 – 2024-05-06 (×6): 3 mL via RESPIRATORY_TRACT
  Filled 2024-05-05 (×4): qty 3

## 2024-05-05 MED ORDER — ALBUMIN HUMAN 25 % IV SOLN
12.5000 g | Freq: Every day | INTRAVENOUS | Status: DC
  Administered 2024-05-05 – 2024-05-11 (×9): 12.5 g via INTRAVENOUS
  Filled 2024-05-05 (×8): qty 50

## 2024-05-05 NOTE — Inpatient Diabetes Management (Signed)
 Inpatient Diabetes Program Recommendations  AACE/ADA: New Consensus Statement on Inpatient Glycemic Control   Target Ranges:  Prepandial:   less than 140 mg/dL      Peak postprandial:   less than 180 mg/dL (1-2 hours)      Critically ill patients:  140 - 180 mg/dL    Latest Reference Range & Units 05/04/24 07:35 05/04/24 11:48 05/04/24 16:03 05/04/24 20:24 05/05/24 07:23  Glucose-Capillary 70 - 99 mg/dL 821 (H) 780 (H) 796 (H) 265 (H) 162 (H)   Review of Glycemic Control  Diabetes history: DM2 Outpatient Diabetes medications: Semglee  10 units BID, Humalog  0-15 units QID, Januvia 100 mg daily Current orders for Inpatient glycemic control: Semglee  10 units BID, Novolog  0-15 units TID with meals, Tradjenta  5 mg daily  Inpatient Diabetes Program Recommendations:    Insulin : Please consider ordering Novolog  4 units TID with meals for meal coverage if patient eats at least 50% of meals.  Thanks, Earnie Gainer, RN, MSN, CDCES Diabetes Coordinator Inpatient Diabetes Program [phone] (Team Pager from 8am to 5pm)

## 2024-05-05 NOTE — Progress Notes (Signed)
 Pt called out c/o SOB. Adjusted bed to help pt sit up as he was lying almost flat in bed. Turned O2 up to 5L pt sat is 94% on 5L. Initially pt was 85 on 4L, pt was also yelling and stating I can't breathe turn my air up. Lungs clear to auscultation. Pt is grunting and panting. Coached patient to use pursed lip  breathing. Pt stated I can't. Notified Dr Von via epic chat of above. Pt calmed down and able to be turned down to 4L and satting 92%. PT able to talk in complete sentences at this time. See new orders from Dr Von.

## 2024-05-05 NOTE — Plan of Care (Signed)
   Problem: Education: Goal: Knowledge of General Education information will improve Description Including pain rating scale, medication(s)/side effects and non-pharmacologic comfort measures Outcome: Progressing

## 2024-05-05 NOTE — [place] FL2 (Signed)
 [place]  MEDICAID FL2 LEVEL OF CARE FORM     IDENTIFICATION  Patient Name: Eugene [name] Birthdate: 1961-08-30 Sex: male Admission Date (Current Location): 05/02/2024  [place] Number:  Chiropodist and Address:  [facility], [address] [place], [place] [address]      Provider Number: [id number]  Attending Physician Name and Address:  Von Bellis, MD  Relative Name and Phone Number:       Current Level of Care: Hospital Recommended Level of Care: Skilled Nursing Facility Prior Approval Number:    Date Approved/Denied:   PASRR Number: [id number]  Discharge Plan: SNF    Current Diagnoses: Patient Active Problem List   Diagnosis Date Noted   Type 2 diabetes mellitus with peripheral neuropathy (HCC) 05/03/2024   Essential hypertension 05/03/2024   Hypothyroidism 05/03/2024   IBS (irritable bowel syndrome) 05/03/2024   Acute on chronic diastolic CHF (congestive heart failure) (HCC) 05/03/2024   CKD (chronic kidney disease) stage 4, GFR 15-29 ml/min (HCC) 05/03/2024   Anaphylaxis 05/02/2024   Ileus (HCC) 04/23/2024   Acute hypoxic respiratory failure (HCC) 04/19/2024   Acid-base disorder, mixed 04/16/2024   Acute kidney injury (HCC) 04/15/2024   Diabetic gastroparesis (HCC) 04/17/2021   Suprapubic catheter dysfunction (HCC)    Pain    Constipation by delayed colonic transit    Nausea and vomiting 10/21/2019   Suprapubic catheter (HCC)    Sepsis (HCC) 10/02/2019   Thoracic spinal stenosis 02/12/2019   Osteomyelitis of thoracic spine (HCC) 02/08/2019   Peripheral vascular disease (HCC) 02/08/2019   HTN (hypertension) 02/08/2019   Dyslipidemia 02/08/2019   Chronic pain syndrome 02/08/2019   Polysubstance abuse (HCC) 02/08/2019   Chronic hepatitis C without hepatic coma (HCC) 02/08/2019   GERD (gastroesophageal reflux disease) 02/08/2019   Cigarette smoker 02/08/2019   Normocytic anemia 02/08/2019    Catheter-associated urinary tract infection (HCC) 02/07/2019   AKI (acute kidney injury) (HCC) 02/07/2019   Hyperkalemia 02/07/2019   Pressure injury of skin 02/07/2019   Hyperlipidemia 11/07/2018   Drug-seeking behavior 11/07/2018   Abnormality of gait 10/17/2018   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   Abdominal pain 03/24/2018   Acute pain of left knee 01/23/2018   MRSA (methicillin resistant Staphylococcus aureus) septicemia (HCC) 08/17/2017   Bacteremia due to Staphylococcus aureus 08/15/2017   Other acute osteomyelitis, left ankle and foot (HCC) 06/29/2017   Diabetic ulcer of right foot associated with diabetes mellitus due to underlying condition (HCC) 06/27/2017   Cellulitis of left foot 06/27/2017   Tobacco use disorder 06/27/2017   Type 2 diabetes mellitus with hyperglycemia, with long-term current use of insulin  (HCC) 06/18/2017    Orientation RESPIRATION BLADDER Height & Weight     Self, Time, Situation, Place  O2 (Nasal Cannula 4 L) Incontinent, Indwelling catheter Weight: (!) 303 lb 5.7 oz (137.6 kg) Height:  5' (152.4 cm)  BEHAVIORAL SYMPTOMS/MOOD NEUROLOGICAL BOWEL NUTRITION STATUS  Other (Comment) (Irritable)  (None) Incontinent Diet (Carb modified. Fluid restriction 1500 mL.)  AMBULATORY STATUS COMMUNICATION OF NEEDS Skin     Verbally Bruising, Other (Comment) (Erythema/redness, scratch marks.)                       Personal Care Assistance Level of Assistance              Functional Limitations Info  Sight, Hearing, Speech Sight Info: Adequate Hearing Info: Adequate Speech Info: Adequate    SPECIAL CARE FACTORS FREQUENCY  Contractures Contractures Info: Not present    Additional Factors Info  Code Status, Allergies Code Status Info: DNR Allergies Info: NKDA           Current Medications (05/05/2024):  This is the current hospital active medication list Current Facility-Administered Medications  Medication Dose  Route Frequency Provider Last Rate Last Admin   acetaminophen  (TYLENOL ) tablet 650 mg  650 mg Oral Q6H PRN Mansy, Jan A, MD       Or   acetaminophen  (TYLENOL ) suppository 650 mg  650 mg Rectal Q6H PRN Mansy, Jan A, MD       acetylcysteine  (MUCOMYST ) 20 % nebulizer / oral solution 4 mL  4 mL Nebulization BID Aleskerov, Fuad, MD       arformoterol  (BROVANA ) nebulizer solution 15 mcg  15 mcg Nebulization BID Von Bellis, MD       bisacodyl  (DULCOLAX) EC tablet 10 mg  10 mg Oral QHS Von Bellis, MD       bisacodyl  (DULCOLAX) suppository 10 mg  10 mg Rectal Daily PRN Von Bellis, MD       Chlorhexidine  Gluconate Cloth 2 % PADS 6 each  6 each Topical Daily Von Bellis, MD       cyanocobalamin  (VITAMIN B12) tablet 1,000 mcg  1,000 mcg Oral Daily Von Bellis, MD   1,000 mcg at 05/05/24 0944   diphenhydrAMINE  (BENADRYL ) capsule 25 mg  25 mg Oral Q6H Mansy, Jan A, MD   25 mg at 05/05/24 1312   famotidine  (PEPCID ) tablet 10 mg  10 mg Oral Daily Chappell, Alex B, RPH   10 mg at 05/05/24 0945   furosemide  (LASIX ) injection 80 mg  80 mg Intravenous Daily Aleskerov, Fuad, MD   80 mg at 05/05/24 1312   gabapentin  (NEURONTIN ) capsule 300 mg  300 mg Oral QHS Mansy, Jan A, MD   300 mg at 05/04/24 2129   heparin  injection 5,000 Units  5,000 Units Subcutaneous Q8H Ayiku, Bernard, MD   5,000 Units at 05/05/24 1312   hydrALAZINE  (APRESOLINE ) injection 10 mg  10 mg Intravenous Q6H PRN Von Bellis, MD       Or   hydrALAZINE  (APRESOLINE ) tablet 50 mg  50 mg Oral Q6H PRN Von Bellis, MD       insulin  aspart (novoLOG ) injection 0-15 Units  0-15 Units Subcutaneous TID WC Jens Durand, MD   2 Units at 05/05/24 1312   insulin  glargine-yfgn (SEMGLEE ) injection 10 Units  10 Units Subcutaneous BID Mansy, Jan A, MD   10 Units at 05/05/24 0942   iohexol  (OMNIPAQUE ) 9 MG/ML oral solution 500 mL  500 mL Oral Q1H Parris Manna, MD       ipratropium-albuterol  (DUONEB) 0.5-2.5 (3) MG/3ML nebulizer solution 3 mL  3 mL  Nebulization Q6H Von Bellis, MD   3 mL at 05/05/24 1355   levothyroxine  (SYNTHROID ) tablet 175 mcg  175 mcg Oral Q0600 Mansy, Jan A, MD   175 mcg at 05/05/24 9379   linaclotide  (LINZESS ) capsule 145 mcg  145 mcg Oral QAC breakfast Mansy, Jan A, MD   145 mcg at 05/05/24 9057   linagliptin  (TRADJENTA ) tablet 5 mg  5 mg Oral Daily Mansy, Jan A, MD   5 mg at 05/05/24 0942   melatonin tablet 10 mg  10 mg Oral QHS Mansy, Jan A, MD   10 mg at 05/04/24 2129   methylPREDNISolone  sodium succinate (SOLU-MEDROL ) 40 mg/mL injection 40 mg  40 mg Intravenous BID Von Bellis, MD   40 mg at  05/05/24 1312   Followed by   NOREEN ON 05/06/2024] predniSONE  (DELTASONE ) tablet 40 mg  40 mg Oral Q breakfast Von Bellis, MD       metoCLOPramide  (REGLAN ) tablet 5 mg  5 mg Oral TID AC Mansy, Jan A, MD   5 mg at 05/05/24 1312   metoprolol  succinate (TOPROL -XL) 24 hr tablet 25 mg  25 mg Oral Daily Mansy, Jan A, MD   25 mg at 05/05/24 9379   ondansetron  (ZOFRAN ) tablet 4 mg  4 mg Oral Q6H PRN Mansy, Jan A, MD   4 mg at 05/03/24 1219   Or   ondansetron  (ZOFRAN ) injection 4 mg  4 mg Intravenous Q6H PRN Mansy, Jan A, MD       oxyCODONE  (Oxy IR/ROXICODONE ) immediate release tablet 10 mg  10 mg Oral Q8H PRN Jens Durand, MD   10 mg at 05/05/24 0943   polyethylene glycol (MIRALAX  / GLYCOLAX ) packet 17 g  17 g Oral BID Mansy, Jan A, MD   17 g at 05/04/24 2130   spironolactone  (ALDACTONE ) tablet 25 mg  25 mg Oral Daily Aleskerov, Fuad, MD   25 mg at 05/05/24 1312   Vitamin D  (Ergocalciferol ) (DRISDOL ) 1.25 MG (50000 UNIT) capsule 50,000 Units  50,000 Units Oral Q7 days Jens Durand, MD   50,000 Units at 05/04/24 0820     Discharge Medications: Please see discharge summary for a list of discharge medications.  Relevant Imaging Results:  Relevant Lab Results:   Additional Information SS#: [id number]  Lauraine JAYSON Carpen, LCSW

## 2024-05-05 NOTE — Progress Notes (Signed)
 Educated patient that he can eat now and he has a fluid restriction of 1500. Pt yelling stated I haven't had any fluid today why do I have to have a restriction. Attempted to explain to the patient about the fluid restriction but pt cut this RN off stating Why can I eat now, why couldn't I eat this morning. Again attempted to explain to patient that he was scheduled for the US  [place] and the doc put him on NPO status just in case and this RN  checked with US  and they said he could eat. Interrupted several times by yelling complaints at this RN and speaking over this RN. Pt continues to complain about how long he had to eat. Educated patient that he can eat now and he should take the win and call and order lunch. Pt continued yelling at this RN and this RN left the room.

## 2024-05-05 NOTE — Procedures (Signed)
 PROCEDURE SUMMARY:  Successful US  guided left thoracentesis. Yielded of serous fluid. Patient tolerated procedure well. No immediate complications. EBL = trace  Specimen sent for labs.  Post procedure chest X-ray reveals no pneumothorax  Eugene [name] CHRISTELLA Bal PA-C 05/05/2024 5:10 PM

## 2024-05-05 NOTE — Progress Notes (Signed)
 Patient heard asking for water and a popsicle. Pt asked by this RN if he had finished his PO Contrast. Pt stated he had. Pt states that he wants a popsicle. Pt educated that he cannot have anything til after his CT scan. Pt immediately started yelling at this RN stating that You are all liars and this is bullshit. The rest of the rant was unintelligible. Notified CN John d/t he explained the contrast to the patient. Requested CN to explain to the patient as the patient would not stop yelling at this RN and calling this RN a liar. John at bedside to speak with patient and review protocol for abd ct with PO contrast.

## 2024-05-05 NOTE — Progress Notes (Signed)
 Attempted to explain to patient about the CT abdomen with contrast and NPO status afterwards. Pt yelling at this nurse. States he doesn't understand what's going on You all keep changing your minds. Attempted to  Educate patient and patient interrupted this RN to yell and stating I don't get it ya'll can't make up your mind. Educated pt that he does not need to raise his voice to this Charity fundraiser. He was noted to be able to have a normal conversation with Dr Von earlier and can act appropriately with this RN. Pt again yelling stating Ya'll don't know what your doing and your rude. Educated patient that he has rights and does not have to stay in the hospital if he doesn't like the care here. Pt continues yelling at this RN stating I'm not yelling, you haven't seen yelling. Again asked pt to lower his voice and pt stated You've been rude all day. Your'e the rudest nurse Ive ever had. Again reiterated he needs to speak to all staff the way he spoke to Dr Von, with a normal voice. Pt stated again You're the rudest I wouldn't be here if I could breathe woman. Pt asked to take his pills and pt complied. Notified leadership of patient's concerns.

## 2024-05-05 NOTE — Progress Notes (Signed)
 Patient initially refused CHG. Pt later allowed NT to give CHG bath.

## 2024-05-05 NOTE — TOC Initial Note (Signed)
 Transition of Care [facility]) - Initial/Assessment Note    Patient Details  Name: Eugene [name] MRN: [id number] Date of Birth: 17-Feb-1961  Transition of Care [facility]) CM/SW Contact:    Lauraine JAYSON Carpen, LCSW Phone Number: 05/05/2024, 2:58 PM  Clinical Narrative:  CSW met with patient. No family at bedside. CSW introduced role and explained that discharge planning would be discussed. Patient confirmed he is a long-term resident at [facility] and plan is to return there at discharge. No further concerns. CSW will continue to follow patient for support and facilitate return to SNF once medically stable.                Expected Discharge Plan: Skilled Nursing Facility Barriers to Discharge: Continued Medical Work up   Patient Goals and CMS Choice            Expected Discharge Plan and Services     Post Acute Care Choice: Resumption of Svcs/PTA Provider Living arrangements for the past 2 months: Skilled Nursing Facility                                      Prior Living Arrangements/Services Living arrangements for the past 2 months: Skilled Nursing Facility Lives with:: Facility Resident Patient language and need for interpreter reviewed:: Yes Do you feel safe going back to the place where you live?: Yes      Need for Family Participation in Patient Care: Yes (Comment) Care giver support system in place?: Yes (comment)   Criminal Activity/Legal Involvement Pertinent to Current Situation/Hospitalization: No - Comment as needed  Activities of Daily Living   ADL Screening (condition at time of admission) Independently performs ADLs?: No Does the patient have a NEW difficulty with bathing/dressing/toileting/self-feeding that is expected to last >3 days?: No Does the patient have a NEW difficulty with getting in/out of bed, walking, or climbing stairs that is expected to last >3 days?: No Does the patient have a NEW difficulty with communication that is expected to  last >3 days?: No Is the patient deaf or have difficulty hearing?: No Does the patient have difficulty seeing, even when wearing glasses/contacts?: No Does the patient have difficulty concentrating, remembering, or making decisions?: No  Permission Sought/Granted Permission sought to share information with : Facility Industrial/product designer granted to share information with : Yes, Verbal Permission Granted     Permission granted to share info w AGENCY: [facility]        Emotional Assessment Appearance:: Appears stated age Attitude/Demeanor/Rapport: Engaged Affect (typically observed): Accepting, Calm Orientation: : Oriented to Self, Oriented to Place, Oriented to  Time, Oriented to Situation Alcohol / Substance Use: Not Applicable Psych Involvement: No (comment)  Admission diagnosis:  Wheezing [R06.2] Anaphylaxis [T78.2XXA] Facial swelling [R22.0] AKI (acute kidney injury) (HCC) [N17.9] Acute on chronic respiratory failure with hypoxia (HCC) [J96.21] Patient Active Problem List   Diagnosis Date Noted   Type 2 diabetes mellitus with peripheral neuropathy (HCC) 05/03/2024   Essential hypertension 05/03/2024   Hypothyroidism 05/03/2024   IBS (irritable bowel syndrome) 05/03/2024   Acute on chronic diastolic CHF (congestive heart failure) (HCC) 05/03/2024   CKD (chronic kidney disease) stage 4, GFR 15-29 ml/min (HCC) 05/03/2024   Anaphylaxis 05/02/2024   Ileus (HCC) 04/23/2024   Acute hypoxic respiratory failure (HCC) 04/19/2024   Acid-base disorder, mixed 04/16/2024   Acute kidney injury (HCC) 04/15/2024   Diabetic gastroparesis (  HCC) 04/17/2021   Suprapubic catheter dysfunction (HCC)    Pain    Constipation by delayed colonic transit    Nausea and vomiting 10/21/2019   Suprapubic catheter (HCC)    Sepsis (HCC) 10/02/2019   Thoracic spinal stenosis 02/12/2019   Osteomyelitis of thoracic spine (HCC) 02/08/2019   Peripheral vascular disease (HCC)  02/08/2019   HTN (hypertension) 02/08/2019   Dyslipidemia 02/08/2019   Chronic pain syndrome 02/08/2019   Polysubstance abuse (HCC) 02/08/2019   Chronic hepatitis C without hepatic coma (HCC) 02/08/2019   GERD (gastroesophageal reflux disease) 02/08/2019   Cigarette smoker 02/08/2019   Normocytic anemia 02/08/2019   Catheter-associated urinary tract infection (HCC) 02/07/2019   AKI (acute kidney injury) (HCC) 02/07/2019   Hyperkalemia 02/07/2019   Pressure injury of skin 02/07/2019   Hyperlipidemia 11/07/2018   Drug-seeking behavior 11/07/2018   Abnormality of gait 10/17/2018   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   Abdominal pain 03/24/2018   Acute pain of left knee 01/23/2018   MRSA (methicillin resistant Staphylococcus aureus) septicemia (HCC) 08/17/2017   Bacteremia due to Staphylococcus aureus 08/15/2017   Other acute osteomyelitis, left ankle and foot (HCC) 06/29/2017   Diabetic ulcer of right foot associated with diabetes mellitus due to underlying condition (HCC) 06/27/2017   Cellulitis of left foot 06/27/2017   Tobacco use disorder 06/27/2017   Type 2 diabetes mellitus with hyperglycemia, with long-term current use of insulin  (HCC) 06/18/2017   PCP:  [facility], [facility]:   Lorine augusto [facility] [place], [place] - [address] [place] [place] [address] Phone: [phone] Fax: [phone]     Social Drivers of Health (SDOH) Social History: SDOH Screenings   Food Insecurity: Patient Declined (05/03/2024)  Housing: Low Risk  (05/03/2024)  Transportation Needs: No Transportation Needs (05/03/2024)  Financial Resource Strain: Medium Risk (02/02/2019)  Physical Activity: Unknown (02/02/2019)  Social Connections: Somewhat Isolated (02/02/2019)  Stress: Stress Concern Present (02/02/2019)  Tobacco Use: High Risk (05/02/2024)   SDOH Interventions:     Readmission Risk Interventions     No data to display

## 2024-05-05 NOTE — Consult Note (Signed)
 PULMONOLOGY         Date: 05/05/2024,   MRN# [id number] Eugene [name] 05/06/1961     AdmissionWeight: (!) 137.6 kg                 CurrentWeight: (!) 137.6 kg  Referring provider: Dr Von   CHIEF COMPLAINT:   Acute on chronic hypoxemic respiratory failure   HISTORY OF PRESENT ILLNESS   This is a 63 yo anxiety disorder, depression, HCV, dyslpidemia, IRS, osteomyelitis, end stage CKD, s/p bilateral amputations, who came in with facial swelling and progressive dyspnea with wheezing and labored breathing. He had remote hospitalization with hypoxemia and AKI.  In the ER he required 6L/min [place].  He had bloodwork showing severe renal failure with nephrotic range hypoalbuminemia and severe anemia. He received IV steroids and treatment for possible anaphylaxis on arrival.  He had CT chest with findings of lymphadenopathy including subcarinal node measuring 1.6 cm.  There is associated rounded lower lobe left upper lobe lingula lung opacity consistent with atelectasis.Additional lung opacity, also consistent with atelectasis, projects along oblique fissures, greater on the left. There is bilateral interstitial thickening.    PAST MEDICAL HISTORY   Past Medical History:  Diagnosis Date   Anxiety    Depression    Diabetes mellitus without complication (HCC)    Hepatitis C    Hyperlipidemia    IBS (irritable bowel syndrome)    Osteomyelitis (HCC)    Spinal stenosis      SURGICAL HISTORY   Past Surgical History:  Procedure Laterality Date   BACK SURGERY     bilateral amputation Bilateral    CENTRAL LINE INSERTION-TUNNELED N/A 02/02/2019   Procedure: CENTRAL LINE INSERTION-TUNNELED;  Surgeon: Marea Selinda RAMAN, MD;  Location: [facility] INV[facility]IVE [facility];  Service: Cardiovascular;  Laterality: N/A;   COLONOSCOPY WITH PROPOFOL  N/A 01/12/2020   Procedure: COLONOSCOPY WITH PROPOFOL ;  Surgeon: Jinny Carmine, MD;  Location: [facility] ENDOSCOPY;  Service: Endoscopy;  Laterality: N/A;    ESOPHAGOGASTRODUODENOSCOPY (EGD) WITH PROPOFOL  N/A 12/07/2019   Procedure: ESOPHAGOGASTRODUODENOSCOPY (EGD) WITH PROPOFOL ;  Surgeon: Unk Corinn Skiff, MD;  Location: [facility] ENDOSCOPY;  Service: Gastroenterology;  Laterality: N/A;   IR CATHETER TUBE CHANGE  12/04/2019   SPINAL FUSION     THORACIC SPINE SURGERY  01/2019   extensive washout     FAMILY HISTORY   History reviewed. No pertinent family history.   SOCIAL HISTORY   Social History   Tobacco Use   Smoking status: Some Days    Current packs/day: 0.25    Types: Cigarettes   Smokeless tobacco: Never  Vaping Use   Vaping status: Never Used  Substance Use Topics   Alcohol use: Not Currently   Drug use: Not Currently     MEDICATIONS    Home Medication:    Current Medication:  Current Facility-Administered Medications:    acetaminophen  (TYLENOL ) tablet 650 mg, 650 mg, Oral, Q6H PRN **OR** acetaminophen  (TYLENOL ) suppository 650 mg, 650 mg, Rectal, Q6H PRN, Mansy, Jan A, MD   amLODipine  (NORVASC ) tablet 10 mg, 10 mg, Oral, Daily, Von Bellis, MD   bisacodyl  (DULCOLAX) EC tablet 10 mg, 10 mg, Oral, QHS, Kumar, Dileep, MD   bisacodyl  (DULCOLAX) EC tablet 10 mg, 10 mg, Oral, Once, Von Bellis, MD   bisacodyl  (DULCOLAX) suppository 10 mg, 10 mg, Rectal, Daily PRN, Von Bellis, MD   cyanocobalamin  (VITAMIN B12) tablet 1,000 mcg, 1,000 mcg, Oral, Daily, Von Bellis, MD   diphenhydrAMINE  (BENADRYL ) capsule 25 mg, 25 mg,  Oral, Q6H, Mansy, Jan A, MD, 25 mg at 05/05/24 9379   famotidine  (PEPCID ) tablet 10 mg, 10 mg, Oral, Daily, Chappell, Alex B, RPH, 10 mg at 05/04/24 9173   gabapentin  (NEURONTIN ) capsule 300 mg, 300 mg, Oral, QHS, Mansy, Jan A, MD, 300 mg at 05/04/24 2129   heparin  injection 5,000 Units, 5,000 Units, Subcutaneous, Q8H, Ayiku, Bernard, MD, 5,000 Units at 05/05/24 9378   hydrALAZINE  (APRESOLINE ) injection 10 mg, 10 mg, Intravenous, Q6H PRN **OR** hydrALAZINE  (APRESOLINE ) tablet 50 mg, 50 mg, Oral, Q6H PRN,  Von Bellis, MD   insulin  aspart (novoLOG ) injection 0-15 Units, 0-15 Units, Subcutaneous, TID WC, Ayiku, Bernard, MD, 5 Units at 05/04/24 1728   insulin  glargine-yfgn (SEMGLEE ) injection 10 Units, 10 Units, Subcutaneous, BID, Mansy, Jan A, MD, 10 Units at 05/04/24 2137   ipratropium-albuterol  (DUONEB) 0.5-2.5 (3) MG/3ML nebulizer solution 3 mL, 3 mL, Nebulization, Q6H PRN, Von Bellis, MD   levothyroxine  (SYNTHROID ) tablet 175 mcg, 175 mcg, Oral, Q0600, Mansy, Jan A, MD, 175 mcg at 05/05/24 0620   linaclotide  (LINZESS ) capsule 145 mcg, 145 mcg, Oral, QAC breakfast, Mansy, Jan A, MD, 145 mcg at 05/04/24 9180   linagliptin  (TRADJENTA ) tablet 5 mg, 5 mg, Oral, Daily, Mansy, Jan A, MD, 5 mg at 05/04/24 0820   melatonin tablet 10 mg, 10 mg, Oral, QHS, Mansy, Jan A, MD, 10 mg at 05/04/24 2129   metoCLOPramide  (REGLAN ) tablet 5 mg, 5 mg, Oral, TID AC, Mansy, Jan A, MD, 5 mg at 05/04/24 1728   metoprolol  succinate (TOPROL -XL) 24 hr tablet 25 mg, 25 mg, Oral, Daily, Mansy, Jan A, MD, 25 mg at 05/05/24 9379   ondansetron  (ZOFRAN ) tablet 4 mg, 4 mg, Oral, Q6H PRN, 4 mg at 05/03/24 1219 **OR** ondansetron  (ZOFRAN ) injection 4 mg, 4 mg, Intravenous, Q6H PRN, Mansy, Jan A, MD   oxyCODONE  (Oxy IR/ROXICODONE ) immediate release tablet 10 mg, 10 mg, Oral, Q8H PRN, Ayiku, Bernard, MD, 10 mg at 05/04/24 2132   polyethylene glycol (MIRALAX  / GLYCOLAX ) packet 17 g, 17 g, Oral, BID, Mansy, Jan A, MD, 17 g at 05/04/24 2130   Vitamin D  (Ergocalciferol ) (DRISDOL ) 1.25 MG (50000 UNIT) capsule 50,000 Units, 50,000 Units, Oral, Q7 days, Jens Durand, MD, 50,000 Units at 05/04/24 0820    ALLERGIES   Patient has no known allergies.     REVIEW OF SYSTEMS    Review of Systems:  Gen:  Denies  fever, sweats, chills weigh loss  HEENT: Denies blurred vision, double vision, ear pain, eye pain, hearing loss, nose bleeds, sore throat Cardiac:  No dizziness, chest pain or heaviness, chest tightness,edema Resp:    reports dyspnea chronically  Gi: Denies swallowing difficulty, stomach pain, nausea or vomiting, diarrhea, constipation, bowel incontinence Gu:  Denies bladder incontinence, burning urine Ext:   Denies Joint pain, stiffness or swelling Skin: Denies  skin rash, easy bruising or bleeding or hives Endoc:  Denies polyuria, polydipsia , polyphagia or weight change Psych:   Denies depression, insomnia or hallucinations   Other:  All other systems negative   VS: BP (!) 156/81 (BP Location: Right Arm)   Pulse 78   Temp (!) 97.5 F (36.4 C)   Resp (!) 22   Ht 5' (1.524 m)   Wt (!) 137.6 kg   SpO2 92%   BMI 59.24 kg/m      PHYSICAL EXAM    GENERAL:NAD, no fevers, chills, no weakness no fatigue HEAD: Normocephalic, atraumatic.  EYES: Pupils equal, round, reactive to light. Extraocular muscles intact.  No scleral icterus.  MOUTH: Moist mucosal membrane. Dentition intact. No abscess noted.  EAR, NOSE, THROAT: Clear without exudates. No external lesions.  NECK: Supple. No thyromegaly. No nodules. No JVD.  PULMONARY: decreased breath sounds with mild rhonchi worse at bases bilaterally.  CARDIOVASCULAR: S1 and S2. Regular rate and rhythm. No murmurs, rubs, or gallops. No edema. Pedal pulses 2+ bilaterally.  GASTROINTESTINAL: Soft, nontender, nondistended. No masses. Positive bowel sounds. No hepatosplenomegaly.  MUSCULOSKELETAL: No swelling, clubbing, or edema. Range of motion full in all extremities.  NEUROLOGIC: Cranial nerves II through XII are intact. No gross focal neurological deficits. Sensation intact. Reflexes intact.  SKIN: No ulceration, lesions, rashes, or cyanosis. Skin warm and dry. Turgor intact.  PSYCHIATRIC: Mood, affect within normal limits. The patient is awake, alert and oriented x 3. Insight, judgment intact.       IMAGING   Narrative & Impression  CLINICAL DATA:  Acute onset dyspnea with associated facial swelling and associated wheezing. Multiple medical  problems.   EXAM: CT CHEST WITHOUT CONTRAST   TECHNIQUE: Multidetector CT imaging of the chest was performed following the standard protocol without IV contrast.   RADIATION DOSE REDUCTION: This exam was performed according to the departmental dose-optimization program which includes automated exposure control, adjustment of the mA and/or kV according to patient size and/or use of iterative reconstruction technique.   COMPARISON:  Chest radiographs, most recent 05/04/2024. Chest CT, 04/21/2024   FINDINGS: Cardiovascular: Heart is normal in size configuration. Three-vessel coronary artery calcifications. No pericardial effusion. Mildly enlarged main pulmonary artery, 3.5 cm. Aorta normal in caliber. Mild aortic atherosclerotic calcifications.   Mediastinum/Nodes: No neck base, mediastinal or hilar masses. Shotty neck base mediastinal lymph nodes, all subcentimeter with the exception of a subcarinal node measuring 1.6 cm in short axis. These are stable from the prior CT. Trachea esophagus are unremarkable.   Lungs/Pleura: Small left and trace right loculated appearing posterior pleural effusions. There is associated rounded lower lobe left upper lobe lingula lung opacity consistent with atelectasis. Additional lung opacity, also consistent with atelectasis, projects along oblique fissures, greater on the left. There is bilateral interstitial thickening. These findings are stable from the prior CT.   No pneumothorax.   Upper Abdomen: No acute findings.   Musculoskeletal: Previous fractures of T8-T9. Interbody fusion device extends from the lower endplate of T7 to the upper endplate of T10, well positioned and stable. There are well positioned and stable pedicle screws interconnecting rods spanning from T5 through T12. Mild old compression deformity of L1. No osteoblastic or osteolytic lesions.   There is diffuse subcutaneous soft tissue edema along the chest  and visualized upper abdomen.   IMPRESSION: 1. No significant change from the recent prior chest CT. 2. Persistent small left and trace right loculated pleural effusions. Persistent rounded lung opacity in the lower lobes suspected be atelectasis, also noted the left upper lobe lingula and abutting the oblique fissures. 3. Persistent bilateral interstitial thickening suspected to be interstitial pulmonary edema. There is associated diffuse subcutaneous soft tissue edema.   Aortic Atherosclerosis (ICD10-I70.0).     Electronically Signed   By: Alm Parkins M.D.   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   Patient/Family are satisfied with care plan and all questions have been answered.    Provider disclosure: Patient with at least one acute or chronic illness or injury that poses a threat to life or bodily function and is being managed actively during this encounter.  All of the below services have been performed independently by signing provider:  review of prior documentation from internal and or external health records.  Review of previous and current lab results.  Interview and  comprehensive assessment during patient visit today. Review of current and previous chest radiographs/CT scans. Discussion of management and test interpretation with health care team and patient/family.   This document was prepared using Dragon voice recognition software and may include unintentional dictation errors.     Evalena Fujii, M.D.  Division of Pulmonary & Critical Care Medicine

## 2024-05-05 NOTE — Progress Notes (Signed)
 Progress Note    Eugene [name]  FMW:[id number] DOB: [birth year]  DOA: 05/02/2024 PCP: [facility], [facility]      Brief Narrative:    Medical records reviewed and are as summarized below:  Eugene [name] is a 63 y.o. male with medical history significant for anxiety, depression, type 2 diabetes mellitus, dyslipidemia, hepatitis C, spinal stenosis, IBS, osteomyelitis, status post bilateral BKA, who presented to the emergency room with acute onset of dyspnea with associated facial and periorbital swelling with associated wheezing. He said he was told by the staff at the nursing home but his face was swollen and he was probably having an allergic reaction.  He is not aware of any recent change in diet or medication that will trigger any allergic reaction.  No fever or chills.  The patient was discharged from here on 7/22 after being managed for AKI, catheter associated UTI, hyperkalemia, ileus and acute hypoxic respiratory failure.    Initial vital signs in the ED temperature 99.1 F, respiratory rate 20, pulse 74, BP 122/82, O2 sat 93% on 6 L of oxygen.  Chest x-ray impression:  Overall, unchanged appearance of the bilateral pleural effusions and mid and lower lung airspace disease, likely atelectasis    He was given 1 dose of EpiPen , IV steroids, IV famotidine  and IV fluids for suspected anaphylaxis.   Assessment/Plan:   Principal Problem:   Anaphylaxis Active Problems:   Type 2 diabetes mellitus with peripheral neuropathy (HCC)   Essential hypertension   Hypothyroidism   IBS (irritable bowel syndrome)   Acute on chronic diastolic CHF (congestive heart failure) (HCC)   CKD (chronic kidney disease) stage 4, GFR 15-29 ml/min (HCC)    Acute hypoxic respiratory failure: He has been weaned from 4 to 3 L/min oxygen.  Continue weaning attempts.  Wean off oxygen as able.  He may have underlying chronic hypoxic respiratory failure. He was on 6 L on admission. He was not using  oxygen at home  COPD exacerbation. 8/12 Solu-Medrol  40 mg IV x 2 doses followed by prednisone  40 mg p.o. daily started Started Brovana  nebulizer twice daily, DuoNeb every 6 hourly scheduled CT chest showed pleural effusion and atelectasis Follow-up thoracentesis by IR    Suspected anaphylactic reaction: S/p EpiPen  on 05/02/2024, IV Pepcid  and IV Solu-Medrol .  Difficult to rule in or exclude anaphylactic reaction at this time. Facial swelling was probably due to fluid overload.   Fluid overload, acute on chronic diastolic CHF: S/p IV Lasix  x 1 dose on 05/03/2024.   BNP 172.1. 2D echo on 04/19/2024 showed EF estimated at 55 to 60%, indeterminate LV diastolic parameters, left and right atrial were not assessed, technically difficult study. 8/12 Lasix  IV given as per nephrology   Hypoalbuminemia likely contributing to fluid overload.  Albumin  1.9. Probable AKI on CKD stage IV versus progressive CKD stage IV: Creatinine slowly trending upward.  Consulted Dr. Marcelino, nephrologist, to assist with management.    Type II DM with hyperglycemia: Glucose levels are better today.  IV steroids likely contributed to severe hyperglycemia. Continue Lantus  and NovoLog  sliding scale. Hemoglobin A1c is 6.0 on 04/14/2024.   Comorbidities include vitamin D  deficiency, peripheral neuropathy, hypertension, hypothyroidism, IBS, bilateral BKA   Recent discharge from the hospital on 05/01/2024 after hospitalization for AKI, catheter associated UTI, suspected pneumonia, acute hypoxic respiratory failure, gastroparesis, ileus, chronic opioid use   Abdominal discomfort, could be gastroparesis versus ileus Follow CT abdomen pelvis     Diet Order  Diet Carb Modified Fluid consistency: Thin; Room service appropriate? Yes; Fluid restriction: 1500 mL Fluid  Diet effective now                            Consultants: Nephrologist  Procedures: None    Medications:     acetylcysteine   4 mL Nebulization BID   arformoterol   15 mcg Nebulization BID   bisacodyl   10 mg Oral QHS   Chlorhexidine  Gluconate Cloth  6 each Topical Daily   vitamin B-12  1,000 mcg Oral Daily   diphenhydrAMINE   25 mg Oral Q6H   famotidine   10 mg Oral Daily   furosemide   80 mg Intravenous Daily   gabapentin   300 mg Oral QHS   heparin  injection (subcutaneous)  5,000 Units Subcutaneous Q8H   insulin  aspart  0-15 Units Subcutaneous TID WC   insulin  glargine-yfgn  10 Units Subcutaneous BID   ipratropium-albuterol   3 mL Nebulization Q6H   levothyroxine   175 mcg Oral Q0600   linaclotide   145 mcg Oral QAC breakfast   linagliptin   5 mg Oral Daily   melatonin  10 mg Oral QHS   methylPREDNISolone  (SOLU-MEDROL ) injection  40 mg Intravenous BID   Followed by   NOREEN ON 05/06/2024] predniSONE   40 mg Oral Q breakfast   metoCLOPramide   5 mg Oral TID AC   metoprolol  succinate  25 mg Oral Daily   polyethylene glycol  17 g Oral BID   spironolactone   25 mg Oral Daily   Vitamin D  (Ergocalciferol )  50,000 Units Oral Q7 days   Continuous Infusions:  albumin  human 12.5 g (05/05/24 1703)      Anti-infectives (From admission, onward)    None              Family Communication/Anticipated D/C date and plan/Code Status   DVT prophylaxis: heparin  injection 5,000 Units Start: 05/03/24 2200     Code Status: Do not attempt resuscitation (DNR) PRE-ARREST INTERVENTIONS DESIRED  Family Communication: None Disposition Plan: Plan to discharge to SNF   Status is: Inpatient Remains inpatient appropriate because: AKI, acute respiratory failure     Subjective:   Overnight patient had shortness of breath.  Chest x-ray was done, Lasix  IV one-time dose was given.  Patient was placed on BiPAP for as needed.  In the morning time patient was still complaining that he cannot breathe, no chest pain.  Patient was having some abdominal discomfort.  Management plan discussed with  patient.  Objective:    Vitals:   05/05/24 0805 05/05/24 1215 05/05/24 1540 05/05/24 1615  BP: (!) 156/81 (!) 158/88 (!) 151/79 (!) 154/84  Pulse:  83 (!) 42 (!) 44  Resp: (!) 22     Temp:  97.7 F (36.5 C)    TempSrc:  Axillary    SpO2: 92% 91% 91% 91%  Weight:      Height:       No data found.   Intake/Output Summary (Last 24 hours) at 05/05/2024 1732 Last data filed at 05/05/2024 1300 Gross per 24 hour  Intake 240 ml  Output 3450 ml  Net -3210 ml   Filed Weights   05/03/24 2039  Weight: (!) 137.6 kg    Exam:   GEN: Mild to moderate distress due to SOB, appears hypervolemic, and tachypneic SKIN: Erythema of upper back EYES: No pallor or icterus ENT: MMM.  No tongue swelling, periorbital swelling, lip swelling no obvious facial swelling.   CV: RRR  PULM: Equal air entry bilaterally, bilateral wheezes and crackles ABD: soft, ND, NT, +BS CNS: AAO x 3, non focal EXT: s/p BL BKA, edema bilateral thighs  GU: Suprapubic catheter draining amber urine    Data Reviewed:   I have personally reviewed following labs and imaging studies:  Labs: Labs show the following:   Basic Metabolic Panel: Recent Labs  Lab 04/30/24 1000 05/02/24 1926 05/03/24 1034 05/04/24 0532 05/05/24 0456  NA 140 139 135 135 137  K 3.8 4.3 5.0 5.1 4.9  CL 105 104 105 104 107  CO2 27 27 22 23 22   GLUCOSE 195* 145* 403* 201* 203*  BUN 47* 46* 52* 60* 64*  CREATININE 3.74* 4.19* 4.23* 4.44* 4.44*  CALCIUM  6.8* 7.2* 7.3* 7.5* 7.4*  PHOS  --   --   --  5.2* 5.2*   GFR Estimated Creatinine Clearance: 21 mL/min (A) (by C-G formula based on SCr of 4.44 mg/dL (H)). Liver Function Tests: Recent Labs  Lab 05/02/24 1926 05/04/24 0532 05/05/24 0456  AST 16  --   --   ALT 14  --   --   ALKPHOS 48  --   --   BILITOT 0.4  --   --   PROT 5.6*  --   --   ALBUMIN  1.8* 1.9* 1.9*   No results for input(s): LIPASE, AMYLASE in the last 168 hours. No results for input(s): AMMONIA in the  last 168 hours. Coagulation profile No results for input(s): INR, PROTIME in the last 168 hours.  CBC: Recent Labs  Lab 04/30/24 1000 05/02/24 1926 05/03/24 1034  WBC 8.6 6.5 6.3  NEUTROABS 6.0 4.3  --   HGB 9.6* 8.9* 9.3*  HCT 30.0* 28.3* 28.9*  MCV 92.9 94.6 92.6  PLT 188 210 219   Cardiac Enzymes: No results for input(s): CKTOTAL, CKMB, CKMBINDEX, TROPONINI in the last 168 hours. BNP (last 3 results) No results for input(s): PROBNP in the last 8760 hours. CBG: Recent Labs  Lab 05/04/24 1148 05/04/24 1603 05/04/24 2024 05/05/24 0723 05/05/24 1216  GLUCAP 219* 203* 265* 162* 127*   D-Dimer: No results for input(s): DDIMER in the last 72 hours. Hgb A1c: No results for input(s): HGBA1C in the last 72 hours. Lipid Profile: No results for input(s): CHOL, HDL, LDLCALC, TRIG, CHOLHDL, LDLDIRECT in the last 72 hours. Thyroid function studies: No results for input(s): TSH, T4TOTAL, T3FREE, THYROIDAB in the last 72 hours.  Invalid input(s): FREET3 Anemia work up: Recent Labs    05/05/24 0456  FOLATE 6.9   Sepsis Labs: Recent Labs  Lab 04/30/24 1000 05/02/24 1926 05/03/24 1034  WBC 8.6 6.5 6.3    Microbiology Recent Results (from the past 240 hours)  Resp panel by RT-PCR (RSV, Flu A&B, Covid) Anterior Nasal Swab     Status: None   Collection Time: 05/02/24  8:06 PM   Specimen: Anterior Nasal Swab  Result Value Ref Range Status   SARS Coronavirus 2 by RT PCR NEGATIVE NEGATIVE Final    Comment: (NOTE) SARS-CoV-2 target nucleic acids are NOT DETECTED.  The SARS-CoV-2 RNA is generally detectable in upper respiratory specimens during the acute phase of infection. The lowest concentration of SARS-CoV-2 viral copies this assay can detect is 138 copies/mL. A negative result does not preclude SARS-Cov-2 infection and should not be used as the sole basis for treatment or other patient management decisions. A negative result  may occur with  improper specimen collection/handling, submission of specimen other than nasopharyngeal swab, presence  of viral mutation(s) within the areas targeted by this assay, and inadequate number of viral copies(<138 copies/mL). A negative result must be combined with clinical observations, patient history, and epidemiological information. The expected result is Negative.  Fact Sheet for Patients:  BloggerCourse.com  Fact Sheet for Healthcare Providers:  SeriousBroker.it  This test is no t yet approved or cleared by the [place]  FDA and  has been authorized for detection and/or diagnosis of SARS-CoV-2 by FDA under an Emergency Use Authorization (EUA). This EUA will remain  in effect (meaning this test can be used) for the duration of the COVID-19 declaration under Section 564(b)(1) of the Act, 21 U.S.C.section 360bbb-3(b)(1), unless the authorization is terminated  or revoked sooner.       Influenza A by PCR NEGATIVE NEGATIVE Final   Influenza B by PCR NEGATIVE NEGATIVE Final    Comment: (NOTE) The Xpert Xpress SARS-CoV-2/FLU/RSV plus assay is intended as an aid in the diagnosis of influenza from Nasopharyngeal swab specimens and should not be used as a sole basis for treatment. Nasal washings and aspirates are unacceptable for Xpert Xpress SARS-CoV-2/FLU/RSV testing.  Fact Sheet for Patients: BloggerCourse.com  Fact Sheet for Healthcare Providers: SeriousBroker.it  This test is not yet approved or cleared by the [place]  FDA and has been authorized for detection and/or diagnosis of SARS-CoV-2 by FDA under an Emergency Use Authorization (EUA). This EUA will remain in effect (meaning this test can be used) for the duration of the COVID-19 declaration under Section 564(b)(1) of the Act, 21 U.S.C. section 360bbb-3(b)(1), unless the authorization is terminated  or revoked.     Resp Syncytial Virus by PCR NEGATIVE NEGATIVE Final    Comment: (NOTE) Fact Sheet for Patients: BloggerCourse.com  Fact Sheet for Healthcare Providers: SeriousBroker.it  This test is not yet approved or cleared by the [place]  FDA and has been authorized for detection and/or diagnosis of SARS-CoV-2 by FDA under an Emergency Use Authorization (EUA). This EUA will remain in effect (meaning this test can be used) for the duration of the COVID-19 declaration under Section 564(b)(1) of the Act, 21 U.S.C. section 360bbb-3(b)(1), unless the authorization is terminated or revoked.  Performed at [facility], [address] [place], [place] [address]     Procedures and diagnostic studies:  DG Chest [facility] 1 View Result Date: 05/05/2024 CLINICAL DATA:  Left-sided thoracentesis EXAM: PORTABLE CHEST 1 VIEW COMPARISON:  05/04/2024, CT chest 05/05/2024, 05/02/2024 FINDINGS: Posterior spinal rods. Stable enlarged cardiomediastinal silhouette. Small pleural effusions slightly diminished on the left. Persistent bibasilar opacities. No definitive pneumothorax. IMPRESSION: 1. Small pleural effusions slightly diminished on the left. No definitive pneumothorax. 2. Otherwise no significant change compared to yesterday's radiograph Electronically Signed   By: Luke Bun M.D.   On: 05/05/2024 16:52   CT CHEST WO CONTRAST Result Date: 05/05/2024 CLINICAL DATA:  Acute onset dyspnea with associated facial swelling and associated wheezing. Multiple medical problems. EXAM: CT CHEST WITHOUT CONTRAST TECHNIQUE: Multidetector CT imaging of the chest was performed following the standard protocol without IV contrast. RADIATION DOSE REDUCTION: This exam was performed according to the departmental dose-optimization program which includes automated exposure control, adjustment of the mA and/or kV according to patient size and/or use of  iterative reconstruction technique. COMPARISON:  Chest radiographs, most recent 05/04/2024. Chest CT, 04/21/2024 FINDINGS: Cardiovascular: Heart is normal in size configuration. Three-vessel coronary artery calcifications. No pericardial effusion. Mildly enlarged main pulmonary artery, 3.5 cm. Aorta normal in caliber. Mild aortic atherosclerotic calcifications. Mediastinum/Nodes: No neck  base, mediastinal or hilar masses. Shotty neck base mediastinal lymph nodes, all subcentimeter with the exception of a subcarinal node measuring 1.6 cm in short axis. These are stable from the prior CT. Trachea esophagus are unremarkable. Lungs/Pleura: Small left and trace right loculated appearing posterior pleural effusions. There is associated rounded lower lobe left upper lobe lingula lung opacity consistent with atelectasis. Additional lung opacity, also consistent with atelectasis, projects along oblique fissures, greater on the left. There is bilateral interstitial thickening. These findings are stable from the prior CT. No pneumothorax. Upper Abdomen: No acute findings. Musculoskeletal: Previous fractures of T8-T9. Interbody fusion device extends from the lower endplate of T7 to the upper endplate of T10, well positioned and stable. There are well positioned and stable pedicle screws interconnecting rods spanning from T5 through T12. Mild old compression deformity of L1. No osteoblastic or osteolytic lesions. There is diffuse subcutaneous soft tissue edema along the chest and visualized upper abdomen. IMPRESSION: 1. No significant change from the recent prior chest CT. 2. Persistent small left and trace right loculated pleural effusions. Persistent rounded lung opacity in the lower lobes suspected be atelectasis, also noted the left upper lobe lingula and abutting the oblique fissures. 3. Persistent bilateral interstitial thickening suspected to be interstitial pulmonary edema. There is associated diffuse subcutaneous soft  tissue edema. Aortic Atherosclerosis (ICD10-I70.0). Electronically Signed   By: Alm Parkins M.D.   On: 05/05/2024 09:41   DG Chest Port 1 View Result Date: 05/04/2024 CLINICAL DATA:  Shortness of breath EXAM: PORTABLE CHEST 1 VIEW COMPARISON:  Radiograph 05/02/2024, CT 04/21/2024 FINDINGS: Stable heart size and mediastinal contours. Bibasilar opacities and pleural effusions, questionable improvement on the left. No new consolidation. No pneumothorax. Thoracic spinal fusion hardware. IMPRESSION: Bibasilar opacities and pleural effusions, questionable improvement on the left. Electronically Signed   By: Andrea Gasman M.D.   On: 05/04/2024 23:11       Total time spent 55 minutes   LOS: 2 days   Sheron Tallman Von  [facility]   Pager on [email or web address]. If 7PM-7AM, please contact night-coverage at [email or web address]     05/05/2024, 5:32 PM

## 2024-05-05 NOTE — Progress Notes (Signed)
 Patient asking this RN when am I gonna get this needle done?. Educated patient that as soon as this RN knows I will communicate the time to him. Pt c/o hunger at this time. Pt has PICC line and this RN educated patient we need to get him cleaned up, and change his bed as his gown has food stains and do a CHG bath. Pt stated Nope. I don't feel good and I don't want to do all that moving. Pt educated on need for a bath and patient again stated nope.

## 2024-05-05 NOTE — Progress Notes (Signed)
 Pt appears in NAD, pt appears to have lowered the head of his bed to 30 degrees incline at this time.

## 2024-05-05 NOTE — Progress Notes (Signed)
 [facility]  ROUNDING NOTE   Subjective:   Eugene [name]  is a 63 y.o.  male  with past medical conditions including diabetes, bilateral BKA, suprapubic catheter, and hepatitis C. Patient [facility] ED from [facility] with facial edema and has been admitted for Wheezing [R06.2] Anaphylaxis [T78.2XXA] Facial swelling [R22.0] AKI (acute kidney injury) (HCC) [N17.9] Acute on chronic respiratory failure with hypoxia (HCC) [J96.21]  Patient is known to our practice from recent admission. He was released and pending follow up appt in our office at discharge.  Update Patient seen laying in bed Denies pain or discomfort 4 L nasal cannula  Creatinine 4.44 Urine output 2950 mL in preceding 24 hours  Objective:  Vital signs in last 24 hours:  Temp:  [97.5 F (36.4 C)-98.2 F (36.8 C)] 97.7 F (36.5 C) (08/12 1215) Pulse Rate:  [42-106] 83 (08/12 1215) Resp:  [18-22] 22 (08/12 0805) BP: (152-182)/(71-110) 158/88 (08/12 1215) SpO2:  [91 %-94 %] 91 % (08/12 1215)  Weight change:  Filed Weights   05/03/24 2039  Weight: (!) 137.6 kg    Intake/Output: I/O last 3 completed shifts: In: 480 [P.O.:480] Out: 3650 [Urine:3650]   Intake/Output this shift:  Total I/O In: -  Out: 500 [Urine:500]  Physical Exam: General: NAD  Head: Normocephalic  Eyes: Anicteric  Neck: Supple  Lungs:  Diminished, 4LNC  Heart: Regular rate   Abdomen:  Distended and firm, obese  Extremities: Trace peripheral edema.,  Bilateral BKA  Neurologic: Awake and alert  Skin: No rashes  Access: None  Suprapubic catheter in place  Basic Metabolic Panel: Recent Labs  Lab 04/30/24 1000 05/02/24 1926 05/03/24 1034 05/04/24 0532 05/05/24 0456  NA 140 139 135 135 137  K 3.8 4.3 5.0 5.1 4.9  CL 105 104 105 104 107  CO2 27 27 22 23 22   GLUCOSE 195* 145* 403* 201* 203*  BUN 47* 46* 52* 60* 64*  CREATININE 3.74* 4.19* 4.23* 4.44* 4.44*  CALCIUM  6.8* 7.2* 7.3* 7.5* 7.4*  PHOS  --   --   --  5.2*  5.2*    Liver Function Tests: Recent Labs  Lab 05/02/24 1926 05/04/24 0532 05/05/24 0456  AST 16  --   --   ALT 14  --   --   ALKPHOS 48  --   --   BILITOT 0.4  --   --   PROT 5.6*  --   --   ALBUMIN  1.8* 1.9* 1.9*   No results for input(s): LIPASE, AMYLASE in the last 168 hours. No results for input(s): AMMONIA in the last 168 hours.  CBC: Recent Labs  Lab 04/30/24 1000 05/02/24 1926 05/03/24 1034  WBC 8.6 6.5 6.3  NEUTROABS 6.0 4.3  --   HGB 9.6* 8.9* 9.3*  HCT 30.0* 28.3* 28.9*  MCV 92.9 94.6 92.6  PLT 188 210 219    Cardiac Enzymes: No results for input(s): CKTOTAL, CKMB, CKMBINDEX, TROPONINI in the last 168 hours.  BNP: Invalid input(s): POCBNP  CBG: Recent Labs  Lab 05/04/24 1148 05/04/24 1603 05/04/24 2024 05/05/24 0723 05/05/24 1216  GLUCAP 219* 203* 265* 162* 127*    Microbiology: Results for orders placed or performed during the hospital encounter of 05/02/24  Resp panel by RT-PCR (RSV, Flu A&B, Covid) Anterior Nasal Swab     Status: None   Collection Time: 05/02/24  8:06 PM   Specimen: Anterior Nasal Swab  Result Value Ref Range Status   SARS Coronavirus 2 by RT PCR  NEGATIVE NEGATIVE Final    Comment: (NOTE) SARS-CoV-2 target nucleic acids are NOT DETECTED.  The SARS-CoV-2 RNA is generally detectable in upper respiratory specimens during the acute phase of infection. The lowest concentration of SARS-CoV-2 viral copies this assay can detect is 138 copies/mL. A negative result does not preclude SARS-Cov-2 infection and should not be used as the sole basis for treatment or other patient management decisions. A negative result may occur with  improper specimen collection/handling, submission of specimen other than nasopharyngeal swab, presence of viral mutation(s) within the areas targeted by this assay, and inadequate number of viral copies(<138 copies/mL). A negative result must be combined with clinical observations,  patient history, and epidemiological information. The expected result is Negative.  Fact Sheet for Patients:  BloggerCourse.com  Fact Sheet for Healthcare Providers:  SeriousBroker.it  This test is no t yet approved or cleared by the [place]  FDA and  has been authorized for detection and/or diagnosis of SARS-CoV-2 by FDA under an Emergency Use Authorization (EUA). This EUA will remain  in effect (meaning this test can be used) for the duration of the COVID-19 declaration under Section 564(b)(1) of the Act, 21 U.S.C.section 360bbb-3(b)(1), unless the authorization is terminated  or revoked sooner.       Influenza A by PCR NEGATIVE NEGATIVE Final   Influenza B by PCR NEGATIVE NEGATIVE Final    Comment: (NOTE) The Xpert Xpress SARS-CoV-2/FLU/RSV plus assay is intended as an aid in the diagnosis of influenza from Nasopharyngeal swab specimens and should not be used as a sole basis for treatment. Nasal washings and aspirates are unacceptable for Xpert Xpress SARS-CoV-2/FLU/RSV testing.  Fact Sheet for Patients: BloggerCourse.com  Fact Sheet for Healthcare Providers: SeriousBroker.it  This test is not yet approved or cleared by the [place]  FDA and has been authorized for detection and/or diagnosis of SARS-CoV-2 by FDA under an Emergency Use Authorization (EUA). This EUA will remain in effect (meaning this test can be used) for the duration of the COVID-19 declaration under Section 564(b)(1) of the Act, 21 U.S.C. section 360bbb-3(b)(1), unless the authorization is terminated or revoked.     Resp Syncytial Virus by PCR NEGATIVE NEGATIVE Final    Comment: (NOTE) Fact Sheet for Patients: BloggerCourse.com  Fact Sheet for Healthcare Providers: SeriousBroker.it  This test is not yet approved or cleared by the [place] FDA and has been authorized for detection and/or diagnosis of SARS-CoV-2 by FDA under an Emergency Use Authorization (EUA). This EUA will remain in effect (meaning this test can be used) for the duration of the COVID-19 declaration under Section 564(b)(1) of the Act, 21 U.S.C. section 360bbb-3(b)(1), unless the authorization is terminated or revoked.  Performed at [facility], [address] [place], [place] [address]     Coagulation Studies: No results for input(s): LABPROT, INR in the last 72 hours.  Urinalysis: No results for input(s): COLORURINE, LABSPEC, PHURINE, GLUCOSEU, HGBUR, BILIRUBINUR, KETONESUR, PROTEINUR, UROBILINOGEN, NITRITE, LEUKOCYTESUR in the last 72 hours.  Invalid input(s): APPERANCEUR    Imaging: CT CHEST WO CONTRAST Result Date: 05/05/2024 CLINICAL DATA:  Acute onset dyspnea with associated facial swelling and associated wheezing. Multiple medical problems. EXAM: CT CHEST WITHOUT CONTRAST TECHNIQUE: Multidetector CT imaging of the chest was performed following the standard protocol without IV contrast. RADIATION DOSE REDUCTION: This exam was performed according to the departmental dose-optimization program which includes automated exposure control, adjustment of the mA and/or kV according to patient size and/or use of iterative reconstruction technique. COMPARISON:  Chest radiographs, most recent 05/04/2024. Chest CT, 04/21/2024 FINDINGS: Cardiovascular: Heart is normal in size configuration. Three-vessel coronary artery calcifications. No pericardial effusion. Mildly enlarged main pulmonary artery, 3.5 cm. Aorta normal in caliber. Mild aortic atherosclerotic calcifications. Mediastinum/Nodes: No neck base, mediastinal or hilar masses. Shotty neck base mediastinal lymph nodes, all subcentimeter with the exception of a subcarinal node measuring 1.6 cm in short axis. These are stable from the prior CT. Trachea esophagus are  unremarkable. Lungs/Pleura: Small left and trace right loculated appearing posterior pleural effusions. There is associated rounded lower lobe left upper lobe lingula lung opacity consistent with atelectasis. Additional lung opacity, also consistent with atelectasis, projects along oblique fissures, greater on the left. There is bilateral interstitial thickening. These findings are stable from the prior CT. No pneumothorax. Upper Abdomen: No acute findings. Musculoskeletal: Previous fractures of T8-T9. Interbody fusion device extends from the lower endplate of T7 to the upper endplate of T10, well positioned and stable. There are well positioned and stable pedicle screws interconnecting rods spanning from T5 through T12. Mild old compression deformity of L1. No osteoblastic or osteolytic lesions. There is diffuse subcutaneous soft tissue edema along the chest and visualized upper abdomen. IMPRESSION: 1. No significant change from the recent prior chest CT. 2. Persistent small left and trace right loculated pleural effusions. Persistent rounded lung opacity in the lower lobes suspected be atelectasis, also noted the left upper lobe lingula and abutting the oblique fissures. 3. Persistent bilateral interstitial thickening suspected to be interstitial pulmonary edema. There is associated diffuse subcutaneous soft tissue edema. Aortic Atherosclerosis (ICD10-I70.0). Electronically Signed   By: Alm Parkins M.D.   On: 05/05/2024 09:41   DG Chest Port 1 View Result Date: 05/04/2024 CLINICAL DATA:  Shortness of breath EXAM: PORTABLE CHEST 1 VIEW COMPARISON:  Radiograph 05/02/2024, CT 04/21/2024 FINDINGS: Stable heart size and mediastinal contours. Bibasilar opacities and pleural effusions, questionable improvement on the left. No new consolidation. No pneumothorax. Thoracic spinal fusion hardware. IMPRESSION: Bibasilar opacities and pleural effusions, questionable improvement on the left. Electronically Signed   By:  Andrea Gasman M.D.   On: 05/04/2024 23:11     Medications:      acetylcysteine   4 mL Nebulization BID   arformoterol   15 mcg Nebulization BID   bisacodyl   10 mg Oral QHS   Chlorhexidine  Gluconate Cloth  6 each Topical Daily   vitamin B-12  1,000 mcg Oral Daily   diphenhydrAMINE   25 mg Oral Q6H   famotidine   10 mg Oral Daily   furosemide   80 mg Intravenous Daily   gabapentin   300 mg Oral QHS   heparin  injection (subcutaneous)  5,000 Units Subcutaneous Q8H   insulin  aspart  0-15 Units Subcutaneous TID WC   insulin  glargine-yfgn  10 Units Subcutaneous BID   ipratropium-albuterol   3 mL Nebulization Q6H   levothyroxine   175 mcg Oral Q0600   linaclotide   145 mcg Oral QAC breakfast   linagliptin   5 mg Oral Daily   melatonin  10 mg Oral QHS   methylPREDNISolone  (SOLU-MEDROL ) injection  40 mg Intravenous BID   Followed by   NOREEN ON 05/06/2024] predniSONE   40 mg Oral Q breakfast   metoCLOPramide   5 mg Oral TID AC   metoprolol  succinate  25 mg Oral Daily   polyethylene glycol  17 g Oral BID   spironolactone   25 mg Oral Daily   Vitamin D  (Ergocalciferol )  50,000 Units Oral Q7 days   acetaminophen  **OR** acetaminophen , bisacodyl , hydrALAZINE  **OR** hydrALAZINE , ondansetron  **OR**  ondansetron  (ZOFRAN ) IV, oxyCODONE   Assessment/ Plan:  Eugene [name] is a 63 y.o.  male  with past medical conditions including diabetes, bilateral BKA, suprapubic catheter, hepatitis C, who was admitted to [facility] on 04/14/2024 for Suprapubic catheter (HCC) [Z93.59] AKI (acute kidney injury) (HCC) [N17.9] Acute kidney injury (HCC) [N17.9]    Acute kidney injury with proteinuria chronic kidney disease stage IV, creatinine 3.74 at time of previous discharge.  Creatinine 4.19 on admission.  Etiology of AKI unclear at this time.  Concerning for progression of kidney.   Creatinine remains stable today.  Good urine output noted.  No acute indication for dialysis at this time.  Continue to avoid nephrotoxic  agents and therapies.  Will need to assess patient mobility to determine appropriateness for dialysis.   Lab Results  Component Value Date   CREATININE 4.44 (H) 05/05/2024   CREATININE 4.44 (H) 05/04/2024   CREATININE 4.23 (H) 05/03/2024     Intake/Output Summary (Last 24 hours) at 05/05/2024 1331 Last data filed at 05/05/2024 1218 Gross per 24 hour  Intake --  Output 3450 ml  Net -3450 ml       2.  Hypertension with chronic kidney disease.  Currently prescribed amlodipine  and metoprolol .  Blood pressure acceptable for this patient.  3.  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 Urine protein to creatinine ratio of 15.4 via Foley specimen.  Proteinuria likely secondary to diabetic nephropathy.  Hemoglobin A1c of 6.0% from 04/14/2024.  Previously diabetes control was suboptimal.  Glucose well-controlled during this admission.  4. Anemia of chronic kidney disease Lab Results  Component Value Date   HGB 9.3 (L) 05/03/2024    Hemoglobin within optimal range.  Will consider ESA at a later time.   LOS: 2 Eugene [name] 8/12/20251:31 PM

## 2024-05-06 DIAGNOSIS — E8779 Other fluid overload: Secondary | ICD-10-CM | POA: Diagnosis not present

## 2024-05-06 LAB — BASIC METABOLIC PANEL WITH GFR
Anion gap: 9 (ref 5–15)
BUN: 68 mg/dL — ABNORMAL HIGH (ref 8–23)
CO2: 24 mmol/L (ref 22–32)
Calcium: 7.3 mg/dL — ABNORMAL LOW (ref 8.9–10.3)
Chloride: 102 mmol/L (ref 98–111)
Creatinine, Ser: 4.35 mg/dL — ABNORMAL HIGH (ref 0.61–1.24)
GFR, Estimated: 15 mL/min — ABNORMAL LOW (ref >60)
Glucose, Bld: 385 mg/dL — ABNORMAL HIGH (ref 70–99)
Potassium: 5.4 mmol/L — ABNORMAL HIGH (ref 3.5–5.1)
Sodium: 135 mmol/L (ref 135–145)

## 2024-05-06 LAB — CBC
HCT: 28.9 % — ABNORMAL LOW (ref 39.0–52.0)
Hemoglobin: 9.4 g/dL — ABNORMAL LOW (ref 13.0–17.0)
MCH: 29.3 pg (ref 26.0–34.0)
MCHC: 32.5 g/dL (ref 30.0–36.0)
MCV: 90 fL (ref 80.0–100.0)
Platelets: 209 K/uL (ref 150–400)
RBC: 3.21 MIL/uL — ABNORMAL LOW (ref 4.22–5.81)
RDW: 12.6 % (ref 11.5–15.5)
WBC: 4.7 K/uL (ref 4.0–10.5)
nRBC: 0 % (ref 0.0–0.2)

## 2024-05-06 LAB — HEPATIC FUNCTION PANEL
ALT: 13 U/L (ref 0–44)
AST: 14 U/L — ABNORMAL LOW (ref 15–41)
Albumin: 2 g/dL — ABNORMAL LOW (ref 3.5–5.0)
Alkaline Phosphatase: 41 U/L (ref 38–126)
Bilirubin, Direct: <0.1 mg/dL (ref 0.0–0.2)
Total Bilirubin: 0.3 mg/dL (ref 0.0–1.2)
Total Protein: 5.5 g/dL — ABNORMAL LOW (ref 6.5–8.1)

## 2024-05-06 LAB — PHOSPHORUS: Phosphorus: 5.2 mg/dL — ABNORMAL HIGH (ref 2.5–4.6)

## 2024-05-06 LAB — PATHOLOGIST SMEAR REVIEW

## 2024-05-06 LAB — LIPASE, BLOOD: Lipase: 25 U/L (ref 11–51)

## 2024-05-06 LAB — GLUCOSE, CAPILLARY
Glucose-Capillary: 361 mg/dL — ABNORMAL HIGH (ref 70–99)
Glucose-Capillary: 334 mg/dL — ABNORMAL HIGH (ref 70–99)
Glucose-Capillary: 303 mg/dL — ABNORMAL HIGH (ref 70–99)

## 2024-05-06 LAB — MAGNESIUM: Magnesium: 1.9 mg/dL (ref 1.7–2.4)

## 2024-05-06 MED ORDER — FUROSEMIDE 10 MG/ML IJ SOLN
80.0000 mg | Freq: Two times a day (BID) | INTRAMUSCULAR | Status: DC
  Administered 2024-05-06 – 2024-05-11 (×11): 80 mg via INTRAVENOUS
  Filled 2024-05-06 (×10): qty 8

## 2024-05-06 MED ORDER — INSULIN GLARGINE-YFGN 100 UNIT/ML ~~LOC~~ SOLN
15.0000 [IU] | Freq: Two times a day (BID) | SUBCUTANEOUS | Status: DC
  Administered 2024-05-06 (×2): 15 [IU] via SUBCUTANEOUS
  Filled 2024-05-06 (×2): qty 0.15

## 2024-05-06 MED ORDER — FOLIC ACID 1 MG PO TABS
1.0000 mg | ORAL_TABLET | Freq: Every day | ORAL | Status: DC
  Administered 2024-05-06 – 2024-05-18 (×14): 1 mg via ORAL
  Filled 2024-05-06 (×13): qty 1

## 2024-05-06 MED ORDER — INSULIN GLARGINE-YFGN 100 UNIT/ML ~~LOC~~ SOLN: 10.0000 [IU] | Freq: Two times a day (BID) | SUBCUTANEOUS | Status: DC

## 2024-05-06 MED ORDER — IPRATROPIUM-ALBUTEROL 0.5-2.5 (3) MG/3ML IN SOLN
3.0000 mL | Freq: Three times a day (TID) | RESPIRATORY_TRACT | Status: DC
  Administered 2024-05-06 – 2024-05-09 (×10): 3 mL via RESPIRATORY_TRACT
  Filled 2024-05-06 (×10): qty 3

## 2024-05-06 MED ORDER — INSULIN GLARGINE-YFGN 100 UNIT/ML ~~LOC~~ SOLN: 25.0000 [IU] | Freq: Two times a day (BID) | SUBCUTANEOUS | Status: DC

## 2024-05-06 MED ORDER — INSULIN GLARGINE-YFGN 100 UNIT/ML ~~LOC~~ SOLN
10.0000 [IU] | Freq: Once | SUBCUTANEOUS | Status: AC
  Administered 2024-05-06 (×2): 10 [IU] via SUBCUTANEOUS
  Filled 2024-05-06: qty 0.1

## 2024-05-06 MED ORDER — INSULIN ASPART 100 UNIT/ML IJ SOLN
4.0000 [IU] | Freq: Three times a day (TID) | INTRAMUSCULAR | Status: DC
  Administered 2024-05-06 – 2024-05-07 (×3): 4 [IU] via SUBCUTANEOUS
  Filled 2024-05-06 (×2): qty 1

## 2024-05-06 MED ORDER — GLUCERNA SHAKE PO LIQD
237.0000 mL | Freq: Three times a day (TID) | ORAL | Status: DC
  Administered 2024-05-06 – 2024-05-11 (×12): 237 mL via ORAL

## 2024-05-06 MED ORDER — SODIUM ZIRCONIUM CYCLOSILICATE 10 G PO PACK
10.0000 g | PACK | Freq: Three times a day (TID) | ORAL | Status: AC
  Administered 2024-05-06 (×4): 10 g via ORAL
  Filled 2024-05-06 (×2): qty 1

## 2024-05-06 NOTE — Progress Notes (Signed)
 Progress Note    Eugene [name]  FMW:[id number] DOB: 1961-08-18  DOA: 05/02/2024 PCP: [facility]      Brief Narrative:    Medical records reviewed and are as summarized below:  Eugene [name] is a 63 y.o. male with medical history significant for anxiety, depression, type 2 diabetes mellitus, dyslipidemia, hepatitis C, spinal stenosis, IBS, osteomyelitis, status post bilateral BKA, who presented to the emergency room with acute onset of dyspnea with associated facial and periorbital swelling with associated wheezing. He said he was told by the staff at the nursing home but his face was swollen and he was probably having an allergic reaction.  He is not aware of any recent change in diet or medication that will trigger any allergic reaction.  No fever or chills.  The patient was discharged from here on 7/22 after being managed for AKI, catheter associated UTI, hyperkalemia, ileus and acute hypoxic respiratory failure.    Initial vital signs in the ED temperature 99.1 F, respiratory rate 20, pulse 74, BP 122/82, O2 sat 93% on 6 L of oxygen.  Chest x-ray impression:  Overall, unchanged appearance of the bilateral pleural effusions and mid and lower lung airspace disease, likely atelectasis    He was given 1 dose of EpiPen , IV steroids, IV famotidine  and IV fluids for suspected anaphylaxis.   Assessment/Plan:   Principal Problem:   Anaphylaxis Active Problems:   Type 2 diabetes mellitus with peripheral neuropathy (HCC)   Essential hypertension   Hypothyroidism   IBS (irritable bowel syndrome)   Acute on chronic diastolic CHF (congestive heart failure) (HCC)   CKD (chronic kidney disease) stage 4, GFR 15-29 ml/min (HCC)    Acute hypoxic respiratory failure:  Continue supplemental O2 inhalation and gradually wean off He may have underlying chronic hypoxic respiratory failure and require home O2 inhalation on discharge   COPD exacerbation. 8/12  Solu-Medrol  40 mg IV x 2 doses followed by prednisone  40 mg p.o. daily started Started Brovana  nebulizer twice daily, DuoNeb every 6 hourly scheduled CT chest showed pleural effusion and atelectasis 8/12 s/p left thoracentesis 100 mL fluid was tapped and sent for studies. Follow-up fluid studies   AKI on CKD stage IV, with proteinuria Patient is volume overload secondary to CKD Continue IV Lasix  as per nephro Monitor urine output and renal functions daily Patient may need hemodialysis in near future   Fluid overload, most likely due to CKD IV acute on chronic diastolic CHF: S/p IV Lasix  x 1 dose on 05/03/2024.   BNP 172.1. 2D echo on 04/19/2024 showed EF estimated at 55 to 60%, indeterminate LV diastolic parameters, left and right atrial were not assessed, technically difficult study. 8/13 increased Lasix  80 mg IV twice daily   Hyperkalemia secondary to renal failure Lokelma  given Monitor potassium level daily   Hypoalbuminemia likely contributing to fluid overload.  Albumin  1.9. Probable AKI on CKD stage IV versus progressive CKD stage IV: Creatinine slowly trending upward.  Consulted Dr. Marcelino, nephrologist, to assist with management.  Suspected anaphylactic reaction: S/p EpiPen  on 05/02/2024, IV Pepcid  and IV Solu-Medrol .  Difficult to rule in or exclude anaphylactic reaction at this time. Facial swelling was probably due to fluid overload.   Type II DM with hyperglycemia: IV steroids likely contributed to severe hyperglycemia. Hemoglobin A1c is 6.0 on 04/14/2024. 8/13 increased Semglee  15 units twice daily and NovoLog  4 units 3 times daily with meals Hyperglycemia due to steroids   Comorbidities Peripheral neuropathy, hypertension, hypothyroidism, IBS, bilateral  BKA  Recent discharge from the hospital on 05/01/2024 after hospitalization for AKI, catheter associated UTI, suspected pneumonia, acute hypoxic respiratory failure, gastroparesis, ileus, chronic opioid  use   Abdominal discomfort, could be gastroparesis versus ileus CT abdomen pelvis negative for any obstruction  # Vitamin D  deficiency: started vitamin D  50,000 units p.o. weekly, follow with PCP to repeat vitamin D  level after 3 to 6 months.  # Vitamin B12 level 240, at lower end, goal >400,  started vitamin B12 supplement to prevent deficiency.  Repeat B12 level after 3 to 6 months.  # Folic acid  level 6.8, at lower end, started folic acid  1 mg p.o. daily to prevent deficiency.  Repeat folic acid  level after 3 to 6 months.   Diet Order             Diet Carb Modified Fluid consistency: Thin; Room service appropriate? Yes; Fluid restriction: 1500 mL Fluid  Diet effective now                    Consultants: Nephrologist  Procedures: None    Medications:    acetylcysteine   4 mL Nebulization BID   arformoterol   15 mcg Nebulization BID   bisacodyl   10 mg Oral QHS   Chlorhexidine  Gluconate Cloth  6 each Topical Daily   vitamin B-12  1,000 mcg Oral Daily   diphenhydrAMINE   25 mg Oral Q6H   famotidine   10 mg Oral Daily   feeding supplement (GLUCERNA SHAKE)  237 mL Oral TID BM   folic acid   1 mg Oral Daily   furosemide   80 mg Intravenous Q12H   gabapentin   300 mg Oral QHS   heparin  injection (subcutaneous)  5,000 Units Subcutaneous Q8H   insulin  aspart  0-15 Units Subcutaneous TID WC   insulin  aspart  4 Units Subcutaneous TID WC   insulin  glargine-yfgn  15 Units Subcutaneous BID   ipratropium-albuterol   3 mL Nebulization TID   levothyroxine   175 mcg Oral Q0600   linaclotide   145 mcg Oral QAC breakfast   linagliptin   5 mg Oral Daily   melatonin  10 mg Oral QHS   metoCLOPramide   5 mg Oral TID AC   metoprolol  succinate  25 mg Oral Daily   polyethylene glycol  17 g Oral BID   predniSONE   40 mg Oral Q breakfast   sodium zirconium cyclosilicate   10 g Oral TID   Vitamin D  (Ergocalciferol )  50,000 Units Oral Q7 days   Continuous Infusions:  albumin  human Stopped  (05/06/24 1148)      Anti-infectives (From admission, onward)    None         Family Communication/Anticipated D/C date and plan/Code Status   DVT prophylaxis: heparin  injection 5,000 Units Start: 05/03/24 2200     Code Status: Do not attempt resuscitation (DNR) PRE-ARREST INTERVENTIONS DESIRED  Family Communication: None Disposition Plan: Plan to discharge to SNF   Status is: Inpatient Remains inpatient appropriate because: AKI, acute respiratory failure     Subjective:   No significant events overnight.  Patient still having shortness of breath and significant lower extremity edema, denies any specific complaints. Patient was concerned about high sugar which is due to steroids, patient was advised to continue diabetic diet, diet cannot be changed.  Patient wanted regular diet.  Objective:    Vitals:   05/06/24 0852 05/06/24 1243 05/06/24 1356 05/06/24 1725  BP: (!) 158/85 (!) 158/82  (!) 138/118  Pulse: 75 70  77  Resp: 17 18  14  Temp: 97.8 F (36.6 C) 97.8 F (36.6 C)    TempSrc:      SpO2: 94% 96% 95% 94%  Weight:      Height:       No data found.   Intake/Output Summary (Last 24 hours) at 05/06/2024 1807 Last data filed at 05/06/2024 1753 Gross per 24 hour  Intake 561.17 ml  Output 2450 ml  Net -1888.83 ml   Filed Weights   05/03/24 2039  Weight: (!) 137.6 kg    Exam:   GEN: Mild to moderate distress due to SOB, appears hypervolemic, and tachypneic SKIN: Erythema of upper back EYES: No pallor or icterus ENT: MMM.  No tongue swelling, periorbital swelling, lip swelling no obvious facial swelling.   CV: RRR PULM: Equal air entry bilaterally, bilateral wheezes and crackles ABD: soft, ND, NT, +BS CNS: AAO x 3, non focal EXT: s/p BL BKA, edema bilateral thighs  GU: Suprapubic catheter draining amber urine    Data Reviewed:   I have personally reviewed following labs and imaging studies:  Labs: Labs show the following:   Basic  Metabolic Panel: Recent Labs  Lab 05/02/24 1926 05/03/24 1034 05/04/24 0532 05/05/24 0456 05/06/24 0523  NA 139 135 135 137 135  K 4.3 5.0 5.1 4.9 5.4*  CL 104 105 104 107 102  CO2 27 22 23 22 24   GLUCOSE 145* 403* 201* 203* 385*  BUN 46* 52* 60* 64* 68*  CREATININE 4.19* 4.23* 4.44* 4.44* 4.35*  CALCIUM  7.2* 7.3* 7.5* 7.4* 7.3*  MG  --   --   --   --  1.9  PHOS  --   --  5.2* 5.2* 5.2*   GFR Estimated Creatinine Clearance: 21.4 mL/min (A) (by C-G formula based on SCr of 4.35 mg/dL (H)). Liver Function Tests: Recent Labs  Lab 05/02/24 1926 05/04/24 0532 05/05/24 0456 05/06/24 0523  AST 16  --   --  14*  ALT 14  --   --  13  ALKPHOS 48  --   --  41  BILITOT 0.4  --   --  0.3  PROT 5.6*  --   --  5.5*  ALBUMIN  1.8* 1.9* 1.9* 2.0*   No results for input(s): LIPASE, AMYLASE in the last 168 hours. No results for input(s): AMMONIA in the last 168 hours. Coagulation profile No results for input(s): INR, PROTIME in the last 168 hours.  CBC: Recent Labs  Lab 04/30/24 1000 05/02/24 1926 05/03/24 1034 05/06/24 0523  WBC 8.6 6.5 6.3 4.7  NEUTROABS 6.0 4.3  --   --   HGB 9.6* 8.9* 9.3* 9.4*  HCT 30.0* 28.3* 28.9* 28.9*  MCV 92.9 94.6 92.6 90.0  PLT 188 210 219 209   Cardiac Enzymes: No results for input(s): CKTOTAL, CKMB, CKMBINDEX, TROPONINI in the last 168 hours. BNP (last 3 results) No results for input(s): PROBNP in the last 8760 hours. CBG: Recent Labs  Lab 05/05/24 1753 05/05/24 2121 05/06/24 0853 05/06/24 1228 05/06/24 1751  GLUCAP 229* 337* 361* 334* 303*   D-Dimer: No results for input(s): DDIMER in the last 72 hours. Hgb A1c: No results for input(s): HGBA1C in the last 72 hours. Lipid Profile: No results for input(s): CHOL, HDL, LDLCALC, TRIG, CHOLHDL, LDLDIRECT in the last 72 hours. Thyroid function studies: No results for input(s): TSH, T4TOTAL, T3FREE, THYROIDAB in the last 72 hours.  Invalid  input(s): FREET3 Anemia work up: Recent Labs    05/05/24 0456  FOLATE 6.9  Sepsis Labs: Recent Labs  Lab 04/30/24 1000 05/02/24 1926 05/03/24 1034 05/06/24 0523  WBC 8.6 6.5 6.3 4.7    Microbiology Recent Results (from the past 240 hours)  Resp panel by RT-PCR (RSV, Flu A&B, Covid) Anterior Nasal Swab     Status: None   Collection Time: 05/02/24  8:06 PM   Specimen: Anterior Nasal Swab  Result Value Ref Range Status   SARS Coronavirus 2 by RT PCR NEGATIVE NEGATIVE Final    Comment: (NOTE) SARS-CoV-2 target nucleic acids are NOT DETECTED.  The SARS-CoV-2 RNA is generally detectable in upper respiratory specimens during the acute phase of infection. The lowest concentration of SARS-CoV-2 viral copies this assay can detect is 138 copies/mL. A negative result does not preclude SARS-Cov-2 infection and should not be used as the sole basis for treatment or other patient management decisions. A negative result may occur with  improper specimen collection/handling, submission of specimen other than nasopharyngeal swab, presence of viral mutation(s) within the areas targeted by this assay, and inadequate number of viral copies(<138 copies/mL). A negative result must be combined with clinical observations, patient history, and epidemiological information. The expected result is Negative.  Fact Sheet for Patients:  BloggerCourse.com  Fact Sheet for Healthcare Providers:  SeriousBroker.it  This test is no t yet approved or cleared by the [place]  FDA and  has been authorized for detection and/or diagnosis of SARS-CoV-2 by FDA under an Emergency Use Authorization (EUA). This EUA will remain  in effect (meaning this test can be used) for the duration of the COVID-19 declaration under Section 564(b)(1) of the Act, 21 U.S.C.section 360bbb-3(b)(1), unless the authorization is terminated  or revoked sooner.       Influenza  A by PCR NEGATIVE NEGATIVE Final   Influenza B by PCR NEGATIVE NEGATIVE Final    Comment: (NOTE) The Xpert Xpress SARS-CoV-2/FLU/RSV plus assay is intended as an aid in the diagnosis of influenza from Nasopharyngeal swab specimens and should not be used as a sole basis for treatment. Nasal washings and aspirates are unacceptable for Xpert Xpress SARS-CoV-2/FLU/RSV testing.  Fact Sheet for Patients: BloggerCourse.com  Fact Sheet for Healthcare Providers: SeriousBroker.it  This test is not yet approved or cleared by the [place]  FDA and has been authorized for detection and/or diagnosis of SARS-CoV-2 by FDA under an Emergency Use Authorization (EUA). This EUA will remain in effect (meaning this test can be used) for the duration of the COVID-19 declaration under Section 564(b)(1) of the Act, 21 U.S.C. section 360bbb-3(b)(1), unless the authorization is terminated or revoked.     Resp Syncytial Virus by PCR NEGATIVE NEGATIVE Final    Comment: (NOTE) Fact Sheet for Patients: BloggerCourse.com  Fact Sheet for Healthcare Providers: SeriousBroker.it  This test is not yet approved or cleared by the [place]  FDA and has been authorized for detection and/or diagnosis of SARS-CoV-2 by FDA under an Emergency Use Authorization (EUA). This EUA will remain in effect (meaning this test can be used) for the duration of the COVID-19 declaration under Section 564(b)(1) of the Act, 21 U.S.C. section 360bbb-3(b)(1), unless the authorization is terminated or revoked.  Performed at [facility], [address] [place], [place] [address]   Body fluid culture w Gram Stain     Status: None (Preliminary result)   Collection Time: 05/05/24  4:10 PM   Specimen: PATH Cytology Pleural fluid  Result Value Ref Range Status   Specimen Description   Final    FLUID Performed at [facility], [address], [place], [place] [address]    Special Requests   Final    CYTO PLEU Performed at [facility], [address] [place], [place] [address]    Gram Stain   Final    NO WBC SEEN NO ORGANISMS SEEN CYTOSPIN SMEAR Performed at [facility], [address] [place], [place] [address]    Culture PENDING  Incomplete   Report Status PENDING  Incomplete    Procedures and diagnostic studies:  CT ABDOMEN PELVIS WO CONTRAST Result Date: 05/05/2024 CLINICAL DATA:  Bowel obstruction suspected give oral contrast please Ty EXAM: CT ABDOMEN AND PELVIS WITHOUT CONTRAST TECHNIQUE: Multidetector CT imaging of the abdomen and pelvis was performed following the standard protocol without IV contrast. RADIATION DOSE REDUCTION: This exam was performed according to the departmental dose-optimization program which includes automated exposure control, adjustment of the mA and/or kV according to patient size and/or use of iterative reconstruction technique. COMPARISON:  CT chest 05/05/2024 FINDINGS: Lower chest: Bilateral trace to small pleural effusions with associated pleural thickening. Bilateral lower lobe consolidation. Hepatobiliary: No focal liver abnormality. No gallstones, gallbladder wall thickening, or pericholecystic fluid. No biliary dilatation. Pancreas: Diffusely atrophic. Vaguely hazy contour of the proximal pancreas with associated mild fat stranding. No main pancreatic ductal dilatation. Spleen: Normal in size without focal abnormality. Adrenals/Urinary Tract: No adrenal nodule bilaterally. No nephrolithiasis and no hydronephrosis. No definite contour-deforming renal mass. No ureterolithiasis or hydroureter. Suprapubic catheter with tip and inflated balloon terminating within the inflated urinary bladder lumen. The urinary bladder is grossly unremarkable. Stomach/Bowel: Stomach is within normal limits. No evidence of bowel wall thickening or dilatation. Appendix  appears normal. Vascular/Lymphatic: No abdominal aorta or iliac aneurysm. Moderate atherosclerotic plaque of the aorta and its branches. No abdominal, pelvic, or inguinal lymphadenopathy. Reproductive: Prostate is unremarkable. Other: No intraperitoneal free fluid. No intraperitoneal free gas. No organized fluid collection. Musculoskeletal: Small fat containing umbilical hernia. Diffuse subcutaneus soft tissue edema. Bilateral gynecomastia. No suspicious lytic or blastic osseous lesions. No acute displaced fracture. Stable chronic T12 and L1 compression fractures. Thoracic spine partially visualized posterolateral pleural surgical fusion and rods. T8 and T9 corpectomy. IMPRESSION: 1. Diffusely atrophic pancreas with question proximal acute pancreatitis. Correlate with lipase levels. 2. Diffuse subcutaneus soft tissue edema. 3. Small fat containing umbilical hernia. 4.  Aortic Atherosclerosis (ICD10-I70.0). 5. Bilateral trace to small pleural effusions with associated pleural thickening. Bilateral lower lobe consolidation-finding may represent infection/inflammation versus atelectasis. Limited evaluation on this noncontrast study. Please see separately dictated CT chest 05/05/2024. Electronically Signed   By: Morgane  Naveau M.D.   On: 05/05/2024 19:13   DG Chest Port 1 View Result Date: 05/05/2024 CLINICAL DATA:  Left-sided thoracentesis EXAM: PORTABLE CHEST 1 VIEW COMPARISON:  05/04/2024, CT chest 05/05/2024, 05/02/2024 FINDINGS: Posterior spinal rods. Stable enlarged cardiomediastinal silhouette. Small pleural effusions slightly diminished on the left. Persistent bibasilar opacities. No definitive pneumothorax. IMPRESSION: 1. Small pleural effusions slightly diminished on the left. No definitive pneumothorax. 2. Otherwise no significant change compared to yesterday's radiograph Electronically Signed   By: Luke Bun M.D.   On: 05/05/2024 16:52   CT CHEST WO CONTRAST Result Date: 05/05/2024 CLINICAL DATA:   Acute onset dyspnea with associated facial swelling and associated wheezing. Multiple medical problems. EXAM: CT CHEST WITHOUT CONTRAST TECHNIQUE: Multidetector CT imaging of the chest was performed following the standard protocol without IV contrast. RADIATION DOSE REDUCTION: This exam was performed according to the departmental dose-optimization program which includes automated exposure  control, adjustment of the mA and/or kV according to patient size and/or use of iterative reconstruction technique. COMPARISON:  Chest radiographs, most recent 05/04/2024. Chest CT, 04/21/2024 FINDINGS: Cardiovascular: Heart is normal in size configuration. Three-vessel coronary artery calcifications. No pericardial effusion. Mildly enlarged main pulmonary artery, 3.5 cm. Aorta normal in caliber. Mild aortic atherosclerotic calcifications. Mediastinum/Nodes: No neck base, mediastinal or hilar masses. Shotty neck base mediastinal lymph nodes, all subcentimeter with the exception of a subcarinal node measuring 1.6 cm in short axis. These are stable from the prior CT. Trachea esophagus are unremarkable. Lungs/Pleura: Small left and trace right loculated appearing posterior pleural effusions. There is associated rounded lower lobe left upper lobe lingula lung opacity consistent with atelectasis. Additional lung opacity, also consistent with atelectasis, projects along oblique fissures, greater on the left. There is bilateral interstitial thickening. These findings are stable from the prior CT. No pneumothorax. Upper Abdomen: No acute findings. Musculoskeletal: Previous fractures of T8-T9. Interbody fusion device extends from the lower endplate of T7 to the upper endplate of T10, well positioned and stable. There are well positioned and stable pedicle screws interconnecting rods spanning from T5 through T12. Mild old compression deformity of L1. No osteoblastic or osteolytic lesions. There is diffuse subcutaneous soft tissue edema along  the chest and visualized upper abdomen. IMPRESSION: 1. No significant change from the recent prior chest CT. 2. Persistent small left and trace right loculated pleural effusions. Persistent rounded lung opacity in the lower lobes suspected be atelectasis, also noted the left upper lobe lingula and abutting the oblique fissures. 3. Persistent bilateral interstitial thickening suspected to be interstitial pulmonary edema. There is associated diffuse subcutaneous soft tissue edema. Aortic Atherosclerosis (ICD10-I70.0). Electronically Signed   By: Alm Parkins M.D.   On: 05/05/2024 09:41   DG Chest Port 1 View Result Date: 05/04/2024 CLINICAL DATA:  Shortness of breath EXAM: PORTABLE CHEST 1 VIEW COMPARISON:  Radiograph 05/02/2024, CT 04/21/2024 FINDINGS: Stable heart size and mediastinal contours. Bibasilar opacities and pleural effusions, questionable improvement on the left. No new consolidation. No pneumothorax. Thoracic spinal fusion hardware. IMPRESSION: Bibasilar opacities and pleural effusions, questionable improvement on the left. Electronically Signed   By: Andrea Gasman M.D.   On: 05/04/2024 23:11       Total time spent 55 minutes   LOS: 3 days   Bookert Guzzi Von  [facility]   Pager on [email or web address]. If 7PM-7AM, please contact night-coverage at [email or web address]     05/06/2024, 6:07 PM

## 2024-05-06 NOTE — Progress Notes (Signed)
 [facility]  ROUNDING NOTE   Subjective:   Eugene [name]  is a 63 y.o.  male  with past medical conditions including diabetes, bilateral BKA, suprapubic catheter, and hepatitis C. Patient [facility] ED from [facility] with facial edema and has been admitted for Wheezing [R06.2] Anaphylaxis [T78.2XXA] Facial swelling [R22.0] AKI (acute kidney injury) (HCC) [N17.9] Acute on chronic respiratory failure with hypoxia (HCC) [J96.21]  Patient is known to our practice from recent admission. He was released and pending follow up appt in our office at discharge.  Update Patient seen laying in bed Alert and oriented Increased oxygen requirement, 7L  Creatinine 4.35 Urine output  Objective:  Vital signs in last 24 hours:  Temp:  [97.8 F (36.6 C)-98.6 F (37 C)] 97.8 F (36.6 C) (08/13 1243) Pulse Rate:  [36-92] 36 (08/13 1243) Resp:  [17-20] 17 (08/13 0852) BP: (148-159)/(73-85) 158/82 (08/13 1243) SpO2:  [90 %-96 %] 96 % (08/13 1243)  Weight change:  Filed Weights   05/03/24 2039  Weight: (!) 137.6 kg    Intake/Output: I/O last 3 completed shifts: In: 282 [P.O.:240; IV Piggyback:42] Out: 5550 [Urine:5550]   Intake/Output this shift:  Total I/O In: 240 [P.O.:240] Out: -   Physical Exam: General: NAD  Head: Normocephalic  Eyes: Anicteric  Neck: Supple  Lungs:  Diminished, 7LNC  Heart: Regular rate   Abdomen:  Distended and firm, obese  Extremities: Trace peripheral edema.,  Bilateral BKA  Neurologic: Awake and alert  Skin: No rashes  Access: None  Suprapubic catheter in place  Basic Metabolic Panel: Recent Labs  Lab 05/02/24 1926 05/03/24 1034 05/04/24 0532 05/05/24 0456 05/06/24 0523  NA 139 135 135 137 135  K 4.3 5.0 5.1 4.9 5.4*  CL 104 105 104 107 102  CO2 27 22 23 22 24   GLUCOSE 145* 403* 201* 203* 385*  BUN 46* 52* 60* 64* 68*  CREATININE 4.19* 4.23* 4.44* 4.44* 4.35*  CALCIUM  7.2* 7.3* 7.5* 7.4* 7.3*  MG  --   --   --   --  1.9   PHOS  --   --  5.2* 5.2* 5.2*    Liver Function Tests: Recent Labs  Lab 05/02/24 1926 05/04/24 0532 05/05/24 0456 05/06/24 0523  AST 16  --   --  14*  ALT 14  --   --  13  ALKPHOS 48  --   --  41  BILITOT 0.4  --   --  0.3  PROT 5.6*  --   --  5.5*  ALBUMIN  1.8* 1.9* 1.9* 2.0*   No results for input(s): LIPASE, AMYLASE in the last 168 hours. No results for input(s): AMMONIA in the last 168 hours.  CBC: Recent Labs  Lab 04/30/24 1000 05/02/24 1926 05/03/24 1034 05/06/24 0523  WBC 8.6 6.5 6.3 4.7  NEUTROABS 6.0 4.3  --   --   HGB 9.6* 8.9* 9.3* 9.4*  HCT 30.0* 28.3* 28.9* 28.9*  MCV 92.9 94.6 92.6 90.0  PLT 188 210 219 209    Cardiac Enzymes: No results for input(s): CKTOTAL, CKMB, CKMBINDEX, TROPONINI in the last 168 hours.  BNP: Invalid input(s): POCBNP  CBG: Recent Labs  Lab 05/05/24 1216 05/05/24 1753 05/05/24 2121 05/06/24 0853 05/06/24 1228  GLUCAP 127* 229* 337* 361* 334*    Microbiology: Results for orders placed or performed during the hospital encounter of 05/02/24  Resp panel by RT-PCR (RSV, Flu A&B, Covid) Anterior Nasal Swab     Status: None  Collection Time: 05/02/24  8:06 PM   Specimen: Anterior Nasal Swab  Result Value Ref Range Status   SARS Coronavirus 2 by RT PCR NEGATIVE NEGATIVE Final    Comment: (NOTE) SARS-CoV-2 target nucleic acids are NOT DETECTED.  The SARS-CoV-2 RNA is generally detectable in upper respiratory specimens during the acute phase of infection. The lowest concentration of SARS-CoV-2 viral copies this assay can detect is 138 copies/mL. A negative result does not preclude SARS-Cov-2 infection and should not be used as the sole basis for treatment or other patient management decisions. A negative result may occur with  improper specimen collection/handling, submission of specimen other than nasopharyngeal swab, presence of viral mutation(s) within the areas targeted by this assay, and inadequate  number of viral copies(<138 copies/mL). A negative result must be combined with clinical observations, patient history, and epidemiological information. The expected result is Negative.  Fact Sheet for Patients:  BloggerCourse.com  Fact Sheet for Healthcare Providers:  SeriousBroker.it  This test is no t yet approved or cleared by the [place]  FDA and  has been authorized for detection and/or diagnosis of SARS-CoV-2 by FDA under an Emergency Use Authorization (EUA). This EUA will remain  in effect (meaning this test can be used) for the duration of the COVID-19 declaration under Section 564(b)(1) of the Act, 21 U.S.C.section 360bbb-3(b)(1), unless the authorization is terminated  or revoked sooner.       Influenza A by PCR NEGATIVE NEGATIVE Final   Influenza B by PCR NEGATIVE NEGATIVE Final    Comment: (NOTE) The Xpert Xpress SARS-CoV-2/FLU/RSV plus assay is intended as an aid in the diagnosis of influenza from Nasopharyngeal swab specimens and should not be used as a sole basis for treatment. Nasal washings and aspirates are unacceptable for Xpert Xpress SARS-CoV-2/FLU/RSV testing.  Fact Sheet for Patients: BloggerCourse.com  Fact Sheet for Healthcare Providers: SeriousBroker.it  This test is not yet approved or cleared by the [place]  FDA and has been authorized for detection and/or diagnosis of SARS-CoV-2 by FDA under an Emergency Use Authorization (EUA). This EUA will remain in effect (meaning this test can be used) for the duration of the COVID-19 declaration under Section 564(b)(1) of the Act, 21 U.S.C. section 360bbb-3(b)(1), unless the authorization is terminated or revoked.     Resp Syncytial Virus by PCR NEGATIVE NEGATIVE Final    Comment: (NOTE) Fact Sheet for Patients: BloggerCourse.com  Fact Sheet for Healthcare  Providers: SeriousBroker.it  This test is not yet approved or cleared by the [place]  FDA and has been authorized for detection and/or diagnosis of SARS-CoV-2 by FDA under an Emergency Use Authorization (EUA). This EUA will remain in effect (meaning this test can be used) for the duration of the COVID-19 declaration under Section 564(b)(1) of the Act, 21 U.S.C. section 360bbb-3(b)(1), unless the authorization is terminated or revoked.  Performed at [facility], [address] [place], [place] [address]   Body fluid culture w Gram Stain     Status: None (Preliminary result)   Collection Time: 05/05/24  4:10 PM   Specimen: PATH Cytology Pleural fluid  Result Value Ref Range Status   Specimen Description   Final    FLUID Performed at [facility], [address] [place], [place] [address]    Special Requests   Final    CYTO PLEU Performed at [facility], [address] [place], [place] [address]    Gram Stain   Final    NO WBC SEEN NO ORGANISMS  SEEN CYTOSPIN SMEAR Performed at [facility], [address], [place], [place] [address]    Culture PENDING  Incomplete   Report Status PENDING  Incomplete    Coagulation Studies: No results for input(s): LABPROT, INR in the last 72 hours.  Urinalysis: No results for input(s): COLORURINE, LABSPEC, PHURINE, GLUCOSEU, HGBUR, BILIRUBINUR, KETONESUR, PROTEINUR, UROBILINOGEN, NITRITE, LEUKOCYTESUR in the last 72 hours.  Invalid input(s): APPERANCEUR    Imaging: CT ABDOMEN PELVIS WO CONTRAST Result Date: 05/05/2024 CLINICAL DATA:  Bowel obstruction suspected give oral contrast please Ty EXAM: CT ABDOMEN AND PELVIS WITHOUT CONTRAST TECHNIQUE: Multidetector CT imaging of the abdomen and pelvis was performed following the standard protocol without IV contrast. RADIATION DOSE REDUCTION: This exam was performed according to the departmental  dose-optimization program which includes automated exposure control, adjustment of the mA and/or kV according to patient size and/or use of iterative reconstruction technique. COMPARISON:  CT chest 05/05/2024 FINDINGS: Lower chest: Bilateral trace to small pleural effusions with associated pleural thickening. Bilateral lower lobe consolidation. Hepatobiliary: No focal liver abnormality. No gallstones, gallbladder wall thickening, or pericholecystic fluid. No biliary dilatation. Pancreas: Diffusely atrophic. Vaguely hazy contour of the proximal pancreas with associated mild fat stranding. No main pancreatic ductal dilatation. Spleen: Normal in size without focal abnormality. Adrenals/Urinary Tract: No adrenal nodule bilaterally. No nephrolithiasis and no hydronephrosis. No definite contour-deforming renal mass. No ureterolithiasis or hydroureter. Suprapubic catheter with tip and inflated balloon terminating within the inflated urinary bladder lumen. The urinary bladder is grossly unremarkable. Stomach/Bowel: Stomach is within normal limits. No evidence of bowel wall thickening or dilatation. Appendix appears normal. Vascular/Lymphatic: No abdominal aorta or iliac aneurysm. Moderate atherosclerotic plaque of the aorta and its branches. No abdominal, pelvic, or inguinal lymphadenopathy. Reproductive: Prostate is unremarkable. Other: No intraperitoneal free fluid. No intraperitoneal free gas. No organized fluid collection. Musculoskeletal: Small fat containing umbilical hernia. Diffuse subcutaneus soft tissue edema. Bilateral gynecomastia. No suspicious lytic or blastic osseous lesions. No acute displaced fracture. Stable chronic T12 and L1 compression fractures. Thoracic spine partially visualized posterolateral pleural surgical fusion and rods. T8 and T9 corpectomy. IMPRESSION: 1. Diffusely atrophic pancreas with question proximal acute pancreatitis. Correlate with lipase levels. 2. Diffuse subcutaneus soft tissue  edema. 3. Small fat containing umbilical hernia. 4.  Aortic Atherosclerosis (ICD10-I70.0). 5. Bilateral trace to small pleural effusions with associated pleural thickening. Bilateral lower lobe consolidation-finding may represent infection/inflammation versus atelectasis. Limited evaluation on this noncontrast study. Please see separately dictated CT chest 05/05/2024. Electronically Signed   By: Morgane  Naveau M.D.   On: 05/05/2024 19:13   DG Chest Port 1 View Result Date: 05/05/2024 CLINICAL DATA:  Left-sided thoracentesis EXAM: PORTABLE CHEST 1 VIEW COMPARISON:  05/04/2024, CT chest 05/05/2024, 05/02/2024 FINDINGS: Posterior spinal rods. Stable enlarged cardiomediastinal silhouette. Small pleural effusions slightly diminished on the left. Persistent bibasilar opacities. No definitive pneumothorax. IMPRESSION: 1. Small pleural effusions slightly diminished on the left. No definitive pneumothorax. 2. Otherwise no significant change compared to yesterday's radiograph Electronically Signed   By: Luke Bun M.D.   On: 05/05/2024 16:52   CT CHEST WO CONTRAST Result Date: 05/05/2024 CLINICAL DATA:  Acute onset dyspnea with associated facial swelling and associated wheezing. Multiple medical problems. EXAM: CT CHEST WITHOUT CONTRAST TECHNIQUE: Multidetector CT imaging of the chest was performed following the standard protocol without IV contrast. RADIATION DOSE REDUCTION: This exam was performed according to the departmental dose-optimization program which includes automated exposure control, adjustment of the mA and/or kV according to patient size and/or  use of iterative reconstruction technique. COMPARISON:  Chest radiographs, most recent 05/04/2024. Chest CT, 04/21/2024 FINDINGS: Cardiovascular: Heart is normal in size configuration. Three-vessel coronary artery calcifications. No pericardial effusion. Mildly enlarged main pulmonary artery, 3.5 cm. Aorta normal in caliber. Mild aortic atherosclerotic  calcifications. Mediastinum/Nodes: No neck base, mediastinal or hilar masses. Shotty neck base mediastinal lymph nodes, all subcentimeter with the exception of a subcarinal node measuring 1.6 cm in short axis. These are stable from the prior CT. Trachea esophagus are unremarkable. Lungs/Pleura: Small left and trace right loculated appearing posterior pleural effusions. There is associated rounded lower lobe left upper lobe lingula lung opacity consistent with atelectasis. Additional lung opacity, also consistent with atelectasis, projects along oblique fissures, greater on the left. There is bilateral interstitial thickening. These findings are stable from the prior CT. No pneumothorax. Upper Abdomen: No acute findings. Musculoskeletal: Previous fractures of T8-T9. Interbody fusion device extends from the lower endplate of T7 to the upper endplate of T10, well positioned and stable. There are well positioned and stable pedicle screws interconnecting rods spanning from T5 through T12. Mild old compression deformity of L1. No osteoblastic or osteolytic lesions. There is diffuse subcutaneous soft tissue edema along the chest and visualized upper abdomen. IMPRESSION: 1. No significant change from the recent prior chest CT. 2. Persistent small left and trace right loculated pleural effusions. Persistent rounded lung opacity in the lower lobes suspected be atelectasis, also noted the left upper lobe lingula and abutting the oblique fissures. 3. Persistent bilateral interstitial thickening suspected to be interstitial pulmonary edema. There is associated diffuse subcutaneous soft tissue edema. Aortic Atherosclerosis (ICD10-I70.0). Electronically Signed   By: Alm Parkins M.D.   On: 05/05/2024 09:41   DG Chest Port 1 View Result Date: 05/04/2024 CLINICAL DATA:  Shortness of breath EXAM: PORTABLE CHEST 1 VIEW COMPARISON:  Radiograph 05/02/2024, CT 04/21/2024 FINDINGS: Stable heart size and mediastinal contours. Bibasilar  opacities and pleural effusions, questionable improvement on the left. No new consolidation. No pneumothorax. Thoracic spinal fusion hardware. IMPRESSION: Bibasilar opacities and pleural effusions, questionable improvement on the left. Electronically Signed   By: Andrea Gasman M.D.   On: 05/04/2024 23:11     Medications:    albumin  human 12.5 g (05/06/24 1037)     acetylcysteine   4 mL Nebulization BID   arformoterol   15 mcg Nebulization BID   bisacodyl   10 mg Oral QHS   Chlorhexidine  Gluconate Cloth  6 each Topical Daily   vitamin B-12  1,000 mcg Oral Daily   diphenhydrAMINE   25 mg Oral Q6H   famotidine   10 mg Oral Daily   folic acid   1 mg Oral Daily   furosemide   80 mg Intravenous Q12H   gabapentin   300 mg Oral QHS   heparin  injection (subcutaneous)  5,000 Units Subcutaneous Q8H   insulin  aspart  0-15 Units Subcutaneous TID WC   insulin  glargine-yfgn  25 Units Subcutaneous BID   Followed by   NOREEN ON 05/09/2024] insulin  glargine-yfgn  10 Units Subcutaneous BID   insulin  glargine-yfgn  10 Units Subcutaneous Once   ipratropium-albuterol   3 mL Nebulization Q6H   levothyroxine   175 mcg Oral Q0600   linaclotide   145 mcg Oral QAC breakfast   linagliptin   5 mg Oral Daily   melatonin  10 mg Oral QHS   metoCLOPramide   5 mg Oral TID AC   metoprolol  succinate  25 mg Oral Daily   polyethylene glycol  17 g Oral BID   predniSONE   40 mg Oral  Q breakfast   sodium zirconium cyclosilicate   10 g Oral TID   Vitamin D  (Ergocalciferol )  50,000 Units Oral Q7 days   acetaminophen  **OR** acetaminophen , bisacodyl , hydrALAZINE  **OR** hydrALAZINE , ondansetron  **OR** ondansetron  (ZOFRAN ) IV, oxyCODONE   Assessment/ Plan:  Mr. Kalup Jaquith is a 63 y.o.  male  with past medical conditions including diabetes, bilateral BKA, suprapubic catheter, hepatitis C, who was admitted to [facility] on 04/14/2024 for Suprapubic catheter (HCC) [Z93.59] AKI (acute kidney injury) (HCC) [N17.9] Acute kidney injury (HCC)  [N17.9]    Acute kidney injury with proteinuria chronic kidney disease stage IV, creatinine 3.74 at time of previous discharge.  Creatinine 4.19 on admission.  Etiology of AKI unclear at this time.  Concerning for progression of kidney.   Creatinine remains elevated. Continues to have good urine output. No acute indication for dialysis at this time.    Lab Results  Component Value Date   CREATININE 4.35 (H) 05/06/2024   CREATININE 4.44 (H) 05/05/2024   CREATININE 4.44 (H) 05/04/2024     Intake/Output Summary (Last 24 hours) at 05/06/2024 1255 Last data filed at 05/06/2024 1037 Gross per 24 hour  Intake 521.99 ml  Output 2100 ml  Net -1578.01 ml       2.  Hypertension with chronic kidney disease.  Currently prescribed amlodipine  and metoprolol .  Blood pressure stable  3.  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 Urine protein to creatinine ratio of 15.4 via Foley specimen.  Proteinuria likely secondary to diabetic nephropathy.  Hemoglobin A1c of 6.0% from 04/14/2024.  Previously diabetes control was suboptimal.  Glucose well-controlled during this admission.  4. Anemia of chronic kidney disease Lab Results  Component Value Date   HGB 9.4 (L) 05/06/2024    Hemoglobin at goal.  Will consider ESA at a later time.   LOS: 3 Devyn Sheerin 8/13/202512:55 PM

## 2024-05-06 NOTE — Plan of Care (Signed)

## 2024-05-06 NOTE — Progress Notes (Signed)
 PULMONOLOGY         Date: 05/06/2024,   MRN# [id number] Eugene [name] 1961/05/03     AdmissionWeight: (!) 137.6 kg                 CurrentWeight: (!) 137.6 kg  Referring provider: Dr Von   CHIEF COMPLAINT:   Acute on chronic hypoxemic respiratory failure   HISTORY OF PRESENT ILLNESS   This is a 63 yo anxiety disorder, depression, HCV, dyslpidemia, IRS, osteomyelitis, end stage CKD, s/p bilateral amputations, who came in with facial swelling and progressive dyspnea with wheezing and labored breathing. He had remote hospitalization with hypoxemia and AKI.  In the ER he required 6L/min .  He had bloodwork showing severe renal failure with nephrotic range hypoalbuminemia and severe anemia. He received IV steroids and treatment for possible anaphylaxis on arrival.  He had CT chest with findings of lymphadenopathy including subcarinal node measuring 1.6 cm.  There is associated rounded lower lobe left upper lobe lingula lung opacity consistent with atelectasis.Additional lung opacity, also consistent with atelectasis, projects along oblique fissures, greater on the left. There is bilateral interstitial thickening.   05/06/24- patient with severe hyperglycemia today. 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  He is s/p solumedrol 40 BID and now on prednisone . His O2 req has improved from 7L/min to 5L/min  PAST MEDICAL HISTORY   Past Medical History:  Diagnosis Date   Anxiety    Depression    Diabetes mellitus without complication (HCC)    Hepatitis C    Hyperlipidemia    IBS (irritable bowel syndrome)    Osteomyelitis (HCC)    Spinal stenosis      SURGICAL HISTORY   Past Surgical History:  Procedure Laterality Date   BACK SURGERY     bilateral amputation Bilateral    CENTRAL LINE INSERTION-TUNNELED N/A 02/02/2019   Procedure: CENTRAL LINE  INSERTION-TUNNELED;  Surgeon: Marea Selinda RAMAN, MD;  Location: [facility];  Service: Cardiovascular;  Laterality: N/A;   COLONOSCOPY WITH PROPOFOL  N/A 01/12/2020   Procedure: COLONOSCOPY WITH PROPOFOL ;  Surgeon: Jinny Carmine, MD;  Location: [facility];  Service: Endoscopy;  Laterality: N/A;   ESOPHAGOGASTRODUODENOSCOPY (EGD) WITH PROPOFOL  N/A 12/07/2019   Procedure: ESOPHAGOGASTRODUODENOSCOPY (EGD) WITH PROPOFOL ;  Surgeon: Unk Corinn Skiff, MD;  Location: [facility];  Service: Gastroenterology;  Laterality: N/A;   IR CATHETER TUBE CHANGE  12/04/2019   SPINAL FUSION     THORACIC SPINE SURGERY  01/2019   extensive washout     FAMILY HISTORY   History reviewed. No pertinent family history.   SOCIAL HISTORY   Social History   Tobacco Use   Smoking status: Some Days    Current packs/day: 0.25    Types: Cigarettes   Smokeless tobacco: Never  Vaping Use   Vaping status: Never Used  Substance Use Topics   Alcohol use: Not Currently   Drug use: Not Currently     MEDICATIONS    Home Medication:    Current Medication:  Current Facility-Administered Medications:    acetaminophen  (TYLENOL ) tablet 650 mg, 650 mg, Oral, Q6H PRN **OR** acetaminophen  (TYLENOL ) suppository 650 mg, 650 mg, Rectal, Q6H PRN, Mansy, Jan A, MD   acetylcysteine  (MUCOMYST ) 20 % nebulizer / oral solution 4 mL, 4 mL, Nebulization, BID, Francis Yardley, MD   albumin  human 25 % solution 12.5 g, 12.5 g, Intravenous, Daily,  Clarann Helvey, MD, Stopped at 05/05/24 1747   arformoterol  (BROVANA ) nebulizer solution 15 mcg, 15 mcg, Nebulization, BID, Von Bellis, MD, 15 mcg at 05/05/24 2031   bisacodyl  (DULCOLAX) EC tablet 10 mg, 10 mg, Oral, QHS, Von Bellis, MD, 10 mg at 05/05/24 2220   bisacodyl  (DULCOLAX) suppository 10 mg, 10 mg, Rectal, Daily PRN, Von Bellis, MD   Chlorhexidine  Gluconate Cloth 2 % PADS 6 each, 6 each, Topical, Daily, Von Bellis, MD, 6 each at 05/05/24 1800   cyanocobalamin   (VITAMIN B12) tablet 1,000 mcg, 1,000 mcg, Oral, Daily, Von Bellis, MD, 1,000 mcg at 05/05/24 9055   diphenhydrAMINE  (BENADRYL ) capsule 25 mg, 25 mg, Oral, Q6H, Mansy, Jan A, MD, 25 mg at 05/06/24 0600   famotidine  (PEPCID ) tablet 10 mg, 10 mg, Oral, Daily, Chappell, Alex B, RPH, 10 mg at 05/05/24 0945   furosemide  (LASIX ) injection 80 mg, 80 mg, Intravenous, Daily, Clancy Mullarkey, MD, 80 mg at 05/05/24 1312   gabapentin  (NEURONTIN ) capsule 300 mg, 300 mg, Oral, QHS, Mansy, Jan A, MD, 300 mg at 05/05/24 2220   heparin  injection 5,000 Units, 5,000 Units, Subcutaneous, Q8H, Jens Durand, MD, 5,000 Units at 05/06/24 0559   hydrALAZINE  (APRESOLINE ) injection 10 mg, 10 mg, Intravenous, Q6H PRN **OR** hydrALAZINE  (APRESOLINE ) tablet 50 mg, 50 mg, Oral, Q6H PRN, Von Bellis, MD   insulin  aspart (novoLOG ) injection 0-15 Units, 0-15 Units, Subcutaneous, TID WC, Ayiku, Bernard, MD, 5 Units at 05/05/24 1843   insulin  glargine-yfgn (SEMGLEE ) injection 10 Units, 10 Units, Subcutaneous, BID, Mansy, Jan A, MD, 10 Units at 05/05/24 2220   ipratropium-albuterol  (DUONEB) 0.5-2.5 (3) MG/3ML nebulizer solution 3 mL, 3 mL, Nebulization, Q6H, Von Bellis, MD, 3 mL at 05/06/24 0238   levothyroxine  (SYNTHROID ) tablet 175 mcg, 175 mcg, Oral, Q0600, Mansy, Jan A, MD, 175 mcg at 05/06/24 0600   linaclotide  (LINZESS ) capsule 145 mcg, 145 mcg, Oral, QAC breakfast, Mansy, Jan A, MD, 145 mcg at 05/05/24 9057   linagliptin  (TRADJENTA ) tablet 5 mg, 5 mg, Oral, Daily, Mansy, Jan A, MD, 5 mg at 05/05/24 9057   melatonin tablet 10 mg, 10 mg, Oral, QHS, Mansy, Jan A, MD, 10 mg at 05/05/24 2219   metoCLOPramide  (REGLAN ) tablet 5 mg, 5 mg, Oral, TID AC, Mansy, Jan A, MD, 5 mg at 05/05/24 1659   metoprolol  succinate (TOPROL -XL) 24 hr tablet 25 mg, 25 mg, Oral, Daily, Mansy, Jan A, MD, 25 mg at 05/05/24 0620   ondansetron  (ZOFRAN ) tablet 4 mg, 4 mg, Oral, Q6H PRN, 4 mg at 05/03/24 1219 **OR** ondansetron  (ZOFRAN ) injection 4 mg, 4  mg, Intravenous, Q6H PRN, Mansy, Jan A, MD   oxyCODONE  (Oxy IR/ROXICODONE ) immediate release tablet 10 mg, 10 mg, Oral, Q8H PRN, Ayiku, Bernard, MD, 10 mg at 05/05/24 2238   polyethylene glycol (MIRALAX  / GLYCOLAX ) packet 17 g, 17 g, Oral, BID, Mansy, Jan A, MD, 17 g at 05/05/24 2219   [COMPLETED] methylPREDNISolone  sodium succinate (SOLU-MEDROL ) 40 mg/mL injection 40 mg, 40 mg, Intravenous, BID, 40 mg at 05/05/24 2219 **FOLLOWED BY** predniSONE  (DELTASONE ) tablet 40 mg, 40 mg, Oral, Q breakfast, Von Bellis, MD   spironolactone  (ALDACTONE ) tablet 25 mg, 25 mg, Oral, Daily, Jayline Kilburg, MD, 25 mg at 05/05/24 1312   Vitamin D  (Ergocalciferol ) (DRISDOL ) 1.25 MG (50000 UNIT) capsule 50,000 Units, 50,000 Units, Oral, Q7 days, Jens Durand, MD, 50,000 Units at 05/04/24 0820    ALLERGIES   Patient has no known allergies.     REVIEW OF SYSTEMS    Review of  Systems:  Gen:  Denies  fever, sweats, chills weigh loss  HEENT: Denies blurred vision, double vision, ear pain, eye pain, hearing loss, nose bleeds, sore throat Cardiac:  No dizziness, chest pain or heaviness, chest tightness,edema Resp:   reports dyspnea chronically  Gi: Denies swallowing difficulty, stomach pain, nausea or vomiting, diarrhea, constipation, bowel incontinence Gu:  Denies bladder incontinence, burning urine Ext:   Denies Joint pain, stiffness or swelling Skin: Denies  skin rash, easy bruising or bleeding or hives Endoc:  Denies polyuria, polydipsia , polyphagia or weight change Psych:   Denies depression, insomnia or hallucinations   Other:  All other systems negative   VS: BP (!) 148/73 (BP Location: Right Arm)   Pulse 75   Temp 98.5 F (36.9 C)   Resp 20   Ht 5' (1.524 m)   Wt (!) 137.6 kg   SpO2 94%   BMI 59.24 kg/m      PHYSICAL EXAM    GENERAL:NAD, no fevers, chills, no weakness no fatigue HEAD: Normocephalic, atraumatic.  EYES: Pupils equal, round, reactive to light. Extraocular muscles  intact. No scleral icterus.  MOUTH: Moist mucosal membrane. Dentition intact. No abscess noted.  EAR, NOSE, THROAT: Clear without exudates. No external lesions.  NECK: Supple. No thyromegaly. No nodules. No JVD.  PULMONARY: decreased breath sounds with mild rhonchi worse at bases bilaterally.  CARDIOVASCULAR: S1 and S2. Regular rate and rhythm. No murmurs, rubs, or gallops. No edema. Pedal pulses 2+ bilaterally.  GASTROINTESTINAL: Soft, nontender, nondistended. No masses. Positive bowel sounds. No hepatosplenomegaly.  MUSCULOSKELETAL: No swelling, clubbing, or edema. Range of motion full in all extremities.  NEUROLOGIC: Cranial nerves II through XII are intact. No gross focal neurological deficits. Sensation intact. Reflexes intact.  SKIN: No ulceration, lesions, rashes, or cyanosis. Skin warm and dry. Turgor intact.  PSYCHIATRIC: Mood, affect within normal limits. The patient is awake, alert and oriented x 3. Insight, judgment intact.       IMAGING   Narrative & Impression  CLINICAL DATA:  Acute onset dyspnea with associated facial swelling and associated wheezing. Multiple medical problems.   EXAM: CT CHEST WITHOUT CONTRAST   TECHNIQUE: Multidetector CT imaging of the chest was performed following the standard protocol without IV contrast.   RADIATION DOSE REDUCTION: This exam was performed according to the departmental dose-optimization program which includes automated exposure control, adjustment of the mA and/or kV according to patient size and/or use of iterative reconstruction technique.   COMPARISON:  Chest radiographs, most recent 05/04/2024. Chest CT, 04/21/2024   FINDINGS: Cardiovascular: Heart is normal in size configuration. Three-vessel coronary artery calcifications. No pericardial effusion. Mildly enlarged main pulmonary artery, 3.5 cm. Aorta normal in caliber. Mild aortic atherosclerotic calcifications.   Mediastinum/Nodes: No neck base, mediastinal or hilar  masses. Shotty neck base mediastinal lymph nodes, all subcentimeter with the exception of a subcarinal node measuring 1.6 cm in short axis. These are stable from the prior CT. Trachea esophagus are unremarkable.   Lungs/Pleura: Small left and trace right loculated appearing posterior pleural effusions. There is associated rounded lower lobe left upper lobe lingula lung opacity consistent with atelectasis. Additional lung opacity, also consistent with atelectasis, projects along oblique fissures, greater on the left. There is bilateral interstitial thickening. These findings are stable from the prior CT.   No pneumothorax.   Upper Abdomen: No acute findings.   Musculoskeletal: Previous fractures of T8-T9. Interbody fusion device extends from the lower endplate of T7 to the upper endplate of T10, well positioned  and stable. There are well positioned and stable pedicle screws interconnecting rods spanning from T5 through T12. Mild old compression deformity of L1. No osteoblastic or osteolytic lesions.   There is diffuse subcutaneous soft tissue edema along the chest and visualized upper abdomen.   IMPRESSION: 1. No significant change from the recent prior chest CT. 2. Persistent small left and trace right loculated pleural effusions. Persistent rounded lung opacity in the lower lobes suspected be atelectasis, also noted the left upper lobe lingula and abutting the oblique fissures. 3. Persistent bilateral interstitial thickening suspected to be interstitial pulmonary edema. There is associated diffuse subcutaneous soft tissue edema.   Aortic Atherosclerosis (ICD10-I70.0).     Electronically Signed   By: Alm Parkins M.D.   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   Patient/Family are satisfied with care plan and all questions have been answered.    Provider disclosure: Patient with at least one acute or chronic illness or injury that poses a threat to life or bodily function and is being managed actively during this encounter.  All of the below services have been performed independently by signing provider:  review of prior documentation from internal and or external health records.  Review of previous and current lab results.  Interview and comprehensive assessment during patient visit today. Review of current and previous chest radiographs/CT scans. Discussion of management and test interpretation with health care team and patient/family.   This document was prepared using Dragon voice recognition software and may include unintentional dictation errors.     Shamon Lobo, M.D.  Division of Pulmonary & Critical Care Medicine

## 2024-05-07 ENCOUNTER — Inpatient Hospital Stay

## 2024-05-07 DIAGNOSIS — T782XXA Anaphylactic shock, unspecified, initial encounter: Secondary | ICD-10-CM | POA: Diagnosis not present

## 2024-05-07 LAB — ANA COMPREHENSIVE PANEL
Anti JO-1: <0.2 AI (ref 0.0–0.9)
Centromere Ab Screen: <0.2 AI (ref 0.0–0.9)
Chromatin Ab SerPl-aCnc: <0.2 AI (ref 0.0–0.9)
ENA SM Ab Ser-aCnc: <0.2 AI (ref 0.0–0.9)
Ribonucleic Protein: 0.4 AI (ref 0.0–0.9)
SSA (Ro) (ENA) Antibody, IgG: 0.9 AI (ref 0.0–0.9)
SSB (La) (ENA) Antibody, IgG: <0.2 AI (ref 0.0–0.9)
Scleroderma (Scl-70) (ENA) Antibody, IgG: <0.2 AI (ref 0.0–0.9)
ds DNA Ab: 1 [IU]/mL (ref 0–9)

## 2024-05-07 LAB — PHOSPHORUS: Phosphorus: 5.3 mg/dL — ABNORMAL HIGH (ref 2.5–4.6)

## 2024-05-07 LAB — BASIC METABOLIC PANEL WITH GFR
Anion gap: 8 (ref 5–15)
BUN: 72 mg/dL — ABNORMAL HIGH (ref 8–23)
CO2: 26 mmol/L (ref 22–32)
Calcium: 7.4 mg/dL — ABNORMAL LOW (ref 8.9–10.3)
Chloride: 102 mmol/L (ref 98–111)
Creatinine, Ser: 4.34 mg/dL — ABNORMAL HIGH (ref 0.61–1.24)
GFR, Estimated: 15 mL/min — ABNORMAL LOW (ref >60)
Glucose, Bld: 285 mg/dL — ABNORMAL HIGH (ref 70–99)
Potassium: 4.9 mmol/L (ref 3.5–5.1)
Sodium: 136 mmol/L (ref 135–145)

## 2024-05-07 LAB — CBC
HCT: 29.5 % — ABNORMAL LOW (ref 39.0–52.0)
Hemoglobin: 9.3 g/dL — ABNORMAL LOW (ref 13.0–17.0)
MCH: 29 pg (ref 26.0–34.0)
MCHC: 31.5 g/dL (ref 30.0–36.0)
MCV: 91.9 fL (ref 80.0–100.0)
Platelets: 238 K/uL (ref 150–400)
RBC: 3.21 MIL/uL — ABNORMAL LOW (ref 4.22–5.81)
RDW: 12.5 % (ref 11.5–15.5)
WBC: 8.9 K/uL (ref 4.0–10.5)
nRBC: 0 % (ref 0.0–0.2)

## 2024-05-07 LAB — HEPATIC FUNCTION PANEL
ALT: 15 U/L (ref 0–44)
AST: 19 U/L (ref 15–41)
Albumin: 2 g/dL — ABNORMAL LOW (ref 3.5–5.0)
Alkaline Phosphatase: 41 U/L (ref 38–126)
Bilirubin, Direct: <0.1 mg/dL (ref 0.0–0.2)
Total Bilirubin: 0.3 mg/dL (ref 0.0–1.2)
Total Protein: 5.3 g/dL — ABNORMAL LOW (ref 6.5–8.1)

## 2024-05-07 LAB — GLUCOSE, CAPILLARY
Glucose-Capillary: 263 mg/dL — ABNORMAL HIGH (ref 70–99)
Glucose-Capillary: 329 mg/dL — ABNORMAL HIGH (ref 70–99)
Glucose-Capillary: 347 mg/dL — ABNORMAL HIGH (ref 70–99)
Glucose-Capillary: 324 mg/dL — ABNORMAL HIGH (ref 70–99)

## 2024-05-07 LAB — ANCA PROFILE
Anti-MPO Antibodies: <0.2 U (ref 0.0–0.9)
Anti-PR3 Antibodies: <0.2 U (ref 0.0–0.9)
Atypical P-ANCA titer: <1:20 {titer}
C-ANCA: <1:20 {titer}
P-ANCA: <1:20 {titer}

## 2024-05-07 LAB — MAGNESIUM: Magnesium: 2 mg/dL (ref 1.7–2.4)

## 2024-05-07 MED ORDER — INSULIN ASPART 100 UNIT/ML IJ SOLN
6.0000 [IU] | Freq: Three times a day (TID) | INTRAMUSCULAR | Status: DC
  Administered 2024-05-07 – 2024-05-12 (×15): 6 [IU] via SUBCUTANEOUS
  Filled 2024-05-07 (×13): qty 1

## 2024-05-07 MED ORDER — TRAZODONE HCL 50 MG PO TABS
25.0000 mg | ORAL_TABLET | Freq: Once | ORAL | Status: AC
  Administered 2024-05-07: 25 mg via ORAL
  Filled 2024-05-07: qty 1

## 2024-05-07 MED ORDER — INSULIN GLARGINE-YFGN 100 UNIT/ML ~~LOC~~ SOLN: 18.0000 [IU] | Freq: Two times a day (BID) | SUBCUTANEOUS | Status: DC

## 2024-05-07 MED ORDER — ALTEPLASE 2 MG IJ SOLR
2.0000 mg | Freq: Once | INTRAMUSCULAR | Status: AC
  Administered 2024-05-07: 2 mg
  Filled 2024-05-07: qty 2

## 2024-05-07 MED ORDER — INSULIN GLARGINE-YFGN 100 UNIT/ML ~~LOC~~ SOLN
18.0000 [IU] | Freq: Two times a day (BID) | SUBCUTANEOUS | Status: DC
  Administered 2024-05-07 – 2024-05-12 (×11): 18 [IU] via SUBCUTANEOUS
  Filled 2024-05-07 (×11): qty 0.18

## 2024-05-07 MED ORDER — PREDNISONE 20 MG PO TABS
30.0000 mg | ORAL_TABLET | Freq: Every day | ORAL | Status: AC
  Administered 2024-05-08 – 2024-05-09 (×2): 30 mg via ORAL
  Filled 2024-05-07 (×2): qty 1

## 2024-05-07 NOTE — Plan of Care (Signed)
  Problem: Education: Goal: Ability to describe self-care measures that may prevent or decrease complications (Diabetes Survival Skills Education) will improve Outcome: Progressing   Problem: Education: Goal: Individualized Educational Video(s) Outcome: Progressing   Problem: Coping: Goal: Ability to adjust to condition or change in health will improve Outcome: Progressing   Problem: Health Behavior/Discharge Planning: Goal: Ability to identify and utilize available resources and services will improve Outcome: Progressing

## 2024-05-07 NOTE — Progress Notes (Signed)
 [facility]  ROUNDING NOTE   Subjective:   Eugene [name]  is a 63 y.o.  male  with past medical conditions including diabetes, bilateral BKA, suprapubic catheter, and hepatitis C. Patient [facility] ED from [facility]F with facial edema and has been admitted for Wheezing [R06.2] Anaphylaxis [T78.2XXA] Facial swelling [R22.0] AKI (acute kidney injury) (HCC) [N17.9] Acute on chronic respiratory failure with hypoxia (HCC) [J96.21]  Patient is known to our practice from recent admission. He was released and pending follow up appt in our office at discharge.  Update Patient seen laying in bed Alert Remains frustrated with fluid restriction.   Objective:  Vital signs in last 24 hours:  Temp:  [97.8 F (36.6 C)-98.4 F (36.9 C)] 98.3 F (36.8 C) (08/14 0516) Pulse Rate:  [70-77] 71 (08/14 0516) Resp:  [14-20] 18 (08/14 0516) BP: (108-158)/(56-118) 152/78 (08/14 0516) SpO2:  [91 %-96 %] 91 % (08/14 0802) Weight:  [136 kg] 136 kg (08/14 0600)  Weight change:  Filed Weights   05/03/24 2039 05/07/24 0600  Weight: (!) 137.6 kg 136 kg    Intake/Output: I/O last 3 completed shifts: In: 801.2 [P.O.:720; IV Piggyback:81.2] Out: 2900 [Urine:2900]   Intake/Output this shift:  No intake/output data recorded.  Physical Exam: General: NAD  Head: Normocephalic  Eyes: Anicteric  Neck: Supple  Lungs:  Diminished, 5LNC  Heart: Regular rate   Abdomen:  Distended and firm, obese  Extremities: Trace peripheral edema.,  Bilateral BKA  Neurologic: Awake and alert  Skin: No rashes  Access: None  Suprapubic catheter in place  Basic Metabolic Panel: Recent Labs  Lab 05/03/24 1034 05/04/24 0532 05/05/24 0456 05/06/24 0523 05/07/24 0653  NA 135 135 137 135 136  K 5.0 5.1 4.9 5.4* 4.9  CL 105 104 107 102 102  CO2 22 23 22 24 26   GLUCOSE 403* 201* 203* 385* 285*  BUN 52* 60* 64* 68* 72*  CREATININE 4.23* 4.44* 4.44* 4.35* 4.34*  CALCIUM  7.3* 7.5* 7.4* 7.3* 7.4*  MG  --    --   --  1.9 2.0  PHOS  --  5.2* 5.2* 5.2* 5.3*    Liver Function Tests: Recent Labs  Lab 05/02/24 1926 05/04/24 0532 05/05/24 0456 05/06/24 0523 05/07/24 0653  AST 16  --   --  14* 19  ALT 14  --   --  13 15  ALKPHOS 48  --   --  41 41  BILITOT 0.4  --   --  0.3 0.3  PROT 5.6*  --   --  5.5* 5.3*  ALBUMIN  1.8* 1.9* 1.9* 2.0* 2.0*   Recent Labs  Lab 05/06/24 1817  LIPASE 25   No results for input(s): AMMONIA in the last 168 hours.  CBC: Recent Labs  Lab 05/02/24 1926 05/03/24 1034 05/06/24 0523 05/07/24 0653  WBC 6.5 6.3 4.7 8.9  NEUTROABS 4.3  --   --   --   HGB 8.9* 9.3* 9.4* 9.3*  HCT 28.3* 28.9* 28.9* 29.5*  MCV 94.6 92.6 90.0 91.9  PLT 210 219 209 238    Cardiac Enzymes: No results for input(s): CKTOTAL, CKMB, CKMBINDEX, TROPONINI in the last 168 hours.  BNP: Invalid input(s): POCBNP  CBG: Recent Labs  Lab 05/05/24 2121 05/06/24 0853 05/06/24 1228 05/06/24 1751 05/07/24 0850  GLUCAP 337* 361* 334* 303* 263*    Microbiology: Results for orders placed or performed during the hospital encounter of 05/02/24  Resp panel by RT-PCR (RSV, Flu A&B, Covid) Anterior Nasal  Swab     Status: None   Collection Time: 05/02/24  8:06 PM   Specimen: Anterior Nasal Swab  Result Value Ref Range Status   SARS Coronavirus 2 by RT PCR NEGATIVE NEGATIVE Final    Comment: (NOTE) SARS-CoV-2 target nucleic acids are NOT DETECTED.  The SARS-CoV-2 RNA is generally detectable in upper respiratory specimens during the acute phase of infection. The lowest concentration of SARS-CoV-2 viral copies this assay can detect is 138 copies/mL. A negative result does not preclude SARS-Cov-2 infection and should not be used as the sole basis for treatment or other patient management decisions. A negative result may occur with  improper specimen collection/handling, submission of specimen other than nasopharyngeal swab, presence of viral mutation(s) within the areas  targeted by this assay, and inadequate number of viral copies(<138 copies/mL). A negative result must be combined with clinical observations, patient history, and epidemiological information. The expected result is Negative.  Fact Sheet for Patients:  BloggerCourse.com  Fact Sheet for Healthcare Providers:  SeriousBroker.it  This test is no t yet approved or cleared by the [place]  FDA and  has been authorized for detection and/or diagnosis of SARS-CoV-2 by FDA under an Emergency Use Authorization (EUA). This EUA will remain  in effect (meaning this test can be used) for the duration of the COVID-19 declaration under Section 564(b)(1) of the Act, 21 U.S.C.section 360bbb-3(b)(1), unless the authorization is terminated  or revoked sooner.       Influenza A by PCR NEGATIVE NEGATIVE Final   Influenza B by PCR NEGATIVE NEGATIVE Final    Comment: (NOTE) The Xpert Xpress SARS-CoV-2/FLU/RSV plus assay is intended as an aid in the diagnosis of influenza from Nasopharyngeal swab specimens and should not be used as a sole basis for treatment. Nasal washings and aspirates are unacceptable for Xpert Xpress SARS-CoV-2/FLU/RSV testing.  Fact Sheet for Patients: BloggerCourse.com  Fact Sheet for Healthcare Providers: SeriousBroker.it  This test is not yet approved or cleared by the [place]  FDA and has been authorized for detection and/or diagnosis of SARS-CoV-2 by FDA under an Emergency Use Authorization (EUA). This EUA will remain in effect (meaning this test can be used) for the duration of the COVID-19 declaration under Section 564(b)(1) of the Act, 21 U.S.C. section 360bbb-3(b)(1), unless the authorization is terminated or revoked.     Resp Syncytial Virus by PCR NEGATIVE NEGATIVE Final    Comment: (NOTE) Fact Sheet for  Patients: BloggerCourse.com  Fact Sheet for Healthcare Providers: SeriousBroker.it  This test is not yet approved or cleared by the [place]  FDA and has been authorized for detection and/or diagnosis of SARS-CoV-2 by FDA under an Emergency Use Authorization (EUA). This EUA will remain in effect (meaning this test can be used) for the duration of the COVID-19 declaration under Section 564(b)(1) of the Act, 21 U.S.C. section 360bbb-3(b)(1), unless the authorization is terminated or revoked.  Performed at [facility], [address] [place], [place] [address]   Body fluid culture w Gram Stain     Status: None (Preliminary result)   Collection Time: 05/05/24  4:10 PM   Specimen: PATH Cytology Pleural fluid  Result Value Ref Range Status   Specimen Description   Final    FLUID Performed at [facility], [address], [place], [place] [address]    Special Requests   Final    CYTO PLEU Performed at [facility], [address] [place], [place] [address]    Gram Stain NO WBC  SEEN NO ORGANISMS SEEN CYTOSPIN SMEAR   Final   Culture   Final    NO GROWTH 1 DAY Performed at [facility], [address] [place] [address], [place], [place] [address]    Report Status PENDING  Incomplete    Coagulation Studies: No results for input(s): LABPROT, INR in the last 72 hours.  Urinalysis: No results for input(s): COLORURINE, LABSPEC, PHURINE, GLUCOSEU, HGBUR, BILIRUBINUR, KETONESUR, PROTEINUR, UROBILINOGEN, NITRITE, LEUKOCYTESUR in the last 72 hours.  Invalid input(s): APPERANCEUR    Imaging: US  THORACENTESIS ASP PLEURAL SPACE W/IMG GUIDE Result Date: 05/07/2024 INDICATION: 63 year old male with a history of CKD who presented to the ED with facial swelling and progressive dyspnea with wheezing and labored breathing. Imaging with small left pleural effusion. Request for diagnostic and  therapeutic thoracentesis. EXAM: ULTRASOUND GUIDED left THORACENTESIS MEDICATIONS: 1% lidocaine , 8 mL. COMPLICATIONS: None immediate. PROCEDURE: An ultrasound guided thoracentesis was thoroughly discussed with the patient and questions answered. The benefits, risks, alternatives and complications were also discussed. The patient understands and wishes to proceed with the procedure. Written consent was obtained. Ultrasound was performed to localize and mark an adequate pocket of fluid in the left chest. The area was then prepped and draped in the normal sterile fashion. 1% Lidocaine  was used for local anesthesia. Under ultrasound guidance a 6 Fr Safe-T-Centesis catheter was introduced. Thoracentesis was performed. The catheter was removed and a dressing applied. FINDINGS: A total of approximately 100 mL of serous fluid was removed. Samples were sent to the laboratory as requested by the clinical team. IMPRESSION: Successful ultrasound guided right thoracentesis yielding 100 mL of pleural fluid. Procedure performed by: Sherrilee Bal, PA-C under the supervision of Dr. KANDICE Moan Electronically Signed   By: Marcey Moan M.D.   On: 05/07/2024 07:47   CT ABDOMEN PELVIS WO CONTRAST Result Date: 05/05/2024 CLINICAL DATA:  Bowel obstruction suspected give oral contrast please Ty EXAM: CT ABDOMEN AND PELVIS WITHOUT CONTRAST TECHNIQUE: Multidetector CT imaging of the abdomen and pelvis was performed following the standard protocol without IV contrast. RADIATION DOSE REDUCTION: This exam was performed according to the departmental dose-optimization program which includes automated exposure control, adjustment of the mA and/or kV according to patient size and/or use of iterative reconstruction technique. COMPARISON:  CT chest 05/05/2024 FINDINGS: Lower chest: Bilateral trace to small pleural effusions with associated pleural thickening. Bilateral lower lobe consolidation. Hepatobiliary: No focal liver abnormality. No  gallstones, gallbladder wall thickening, or pericholecystic fluid. No biliary dilatation. Pancreas: Diffusely atrophic. Vaguely hazy contour of the proximal pancreas with associated mild fat stranding. No main pancreatic ductal dilatation. Spleen: Normal in size without focal abnormality. Adrenals/Urinary Tract: No adrenal nodule bilaterally. No nephrolithiasis and no hydronephrosis. No definite contour-deforming renal mass. No ureterolithiasis or hydroureter. Suprapubic catheter with tip and inflated balloon terminating within the inflated urinary bladder lumen. The urinary bladder is grossly unremarkable. Stomach/Bowel: Stomach is within normal limits. No evidence of bowel wall thickening or dilatation. Appendix appears normal. Vascular/Lymphatic: No abdominal aorta or iliac aneurysm. Moderate atherosclerotic plaque of the aorta and its branches. No abdominal, pelvic, or inguinal lymphadenopathy. Reproductive: Prostate is unremarkable. Other: No intraperitoneal free fluid. No intraperitoneal free gas. No organized fluid collection. Musculoskeletal: Small fat containing umbilical hernia. Diffuse subcutaneus soft tissue edema. Bilateral gynecomastia. No suspicious lytic or blastic osseous lesions. No acute displaced fracture. Stable chronic T12 and L1 compression fractures. Thoracic spine partially visualized posterolateral pleural surgical fusion and rods. T8 and T9 corpectomy. IMPRESSION: 1. Diffusely atrophic pancreas with question proximal  acute pancreatitis. Correlate with lipase levels. 2. Diffuse subcutaneus soft tissue edema. 3. Small fat containing umbilical hernia. 4.  Aortic Atherosclerosis (ICD10-I70.0). 5. Bilateral trace to small pleural effusions with associated pleural thickening. Bilateral lower lobe consolidation-finding may represent infection/inflammation versus atelectasis. Limited evaluation on this noncontrast study. Please see separately dictated CT chest 05/05/2024. Electronically Signed    By: Morgane  Naveau M.D.   On: 05/05/2024 19:13   DG Chest Port 1 View Result Date: 05/05/2024 CLINICAL DATA:  Left-sided thoracentesis EXAM: PORTABLE CHEST 1 VIEW COMPARISON:  05/04/2024, CT chest 05/05/2024, 05/02/2024 FINDINGS: Posterior spinal rods. Stable enlarged cardiomediastinal silhouette. Small pleural effusions slightly diminished on the left. Persistent bibasilar opacities. No definitive pneumothorax. IMPRESSION: 1. Small pleural effusions slightly diminished on the left. No definitive pneumothorax. 2. Otherwise no significant change compared to yesterday's radiograph Electronically Signed   By: Luke Bun M.D.   On: 05/05/2024 16:52     Medications:    albumin  human Stopped (05/06/24 1148)     acetylcysteine   4 mL Nebulization BID   arformoterol   15 mcg Nebulization BID   bisacodyl   10 mg Oral QHS   Chlorhexidine  Gluconate Cloth  6 each Topical Daily   vitamin B-12  1,000 mcg Oral Daily   diphenhydrAMINE   25 mg Oral Q6H   famotidine   10 mg Oral Daily   feeding supplement (GLUCERNA SHAKE)  237 mL Oral TID BM   folic acid   1 mg Oral Daily   furosemide   80 mg Intravenous Q12H   gabapentin   300 mg Oral QHS   heparin  injection (subcutaneous)  5,000 Units Subcutaneous Q8H   insulin  aspart  0-15 Units Subcutaneous TID WC   insulin  aspart  6 Units Subcutaneous TID WC   insulin  glargine-yfgn  18 Units Subcutaneous BID   ipratropium-albuterol   3 mL Nebulization TID   levothyroxine   175 mcg Oral Q0600   linaclotide   145 mcg Oral QAC breakfast   linagliptin   5 mg Oral Daily   melatonin  10 mg Oral QHS   metoCLOPramide   5 mg Oral TID AC   metoprolol  succinate  25 mg Oral Daily   polyethylene glycol  17 g Oral BID   predniSONE   40 mg Oral Q breakfast   Vitamin D  (Ergocalciferol )  50,000 Units Oral Q7 days   acetaminophen  **OR** acetaminophen , bisacodyl , hydrALAZINE  **OR** hydrALAZINE , ondansetron  **OR** ondansetron  (ZOFRAN ) IV, oxyCODONE   Assessment/ Plan:  Mr. Jace Fermin is a 63 y.o.  male  with past medical conditions including diabetes, bilateral BKA, suprapubic catheter, hepatitis C, who was admitted to [facility] on [facility]/14/[facility]24 for Suprapubic catheter (HCC) [Z93.59] AKI (acute kidney injury) (HCC) [N17.9] Acute kidney injury (HCC) [N17.9]    Acute kidney injury with proteinuria chronic kidney disease stage IV, creatinine 3.74 at time of previous discharge.  Creatinine 4.19 on admission.  Etiology of AKI unclear at this time.  Concerning for progression of kidney.   Renal function remains stable. Patient states he is able to sit in chair at facility. Requested nurse place patient in chair. Will likely require dialysis in the future, not this admission.    Lab Results  Component Value Date   CREATININE 4.34 (H) 05/07/2024   CREATININE 4.35 (H) 05/06/2024   CREATININE 4.44 (H) 05/05/2024     Intake/Output Summary (Last 24 hours) at 05/07/2024 1124 Last data filed at 05/06/2024 2300 Gross per 24 hour  Intake 519.18 ml  Output 1450 ml  Net -930.82 ml       2.  Hypertension with chronic kidney disease.  Currently prescribed amlodipine  and metoprolol .  Blood pressure acceptable at this time.   3.  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 Urine protein to creatinine ratio of 15.4 via Foley specimen.  Proteinuria likely secondary to diabetic nephropathy.  Hemoglobin A1c of 6.0% from 04/14/2024.  Previously diabetes control was suboptimal.  Glucose elevated  4. Anemia of chronic kidney disease Lab Results  Component Value Date   HGB 9.3 (L) 05/07/2024    Hemoglobin at goal.  Will consider ESA at a later time.   LOS: 4 Tami Blass 8/14/202511:24 AM

## 2024-05-07 NOTE — Inpatient Diabetes Management (Signed)
 Inpatient Diabetes Program Recommendations  AACE/ADA: New Consensus Statement on Inpatient Glycemic Control (2015)  Target Ranges:  Prepandial:   less than 140 mg/dL      Peak postprandial:   less than 180 mg/dL (1-2 hours)      Critically ill patients:  140 - 180 mg/dL    Latest Reference Range & Units 05/06/24 08:53 05/06/24 12:28 05/06/24 17:51  Glucose-Capillary 70 - 99 mg/dL 638 (H)  15 units Novolog   10 units Semglee   334 (H)  11 units Novolog   10 units Semglee   303 (H)  15 units Novolog   15 units Semglee  @2134    (H): Data is abnormally high  Latest Reference Range & Units 05/07/24 08:50  Glucose-Capillary 70 - 99 mg/dL 736 (H)  12 units Novolog    (H): Data is abnormally high    Home DM Meds: Semglee  10 units BID Humalog  0-15 units QID Januvia 100 mg daily    Current Orders: Semglee  15 units BID Novolog  4 units TID with meals Novolog  Moderate Correction Scale/ SSI (0-15 units) TID AC Tradjenta  5 mg daily     Prednisone  40 mg daily   MD- Please consider:  1. Increase Semglee  to 18 units BID  2. Increase Novolog  Meal Coverage to 6 units TID with meals     --Will follow patient during hospitalization--  Adina Rudolpho Arrow RN, MSN, CDCES Diabetes Coordinator Inpatient Glycemic Control Team Team Pager: [phone] (8a-5p)

## 2024-05-07 NOTE — Progress Notes (Addendum)
 PULMONOLOGY         Date: 05/07/2024,   MRN# [id number] Eugene [name] 09/29/1960     AdmissionWeight: (!) 137.6 kg                 CurrentWeight: 136 kg  Referring provider: Dr Von   CHIEF COMPLAINT:   Acute on chronic hypoxemic respiratory failure   HISTORY OF PRESENT ILLNESS   This is a 63 yo anxiety disorder, depression, HCV, dyslpidemia, IRS, osteomyelitis, end stage CKD, s/p bilateral amputations, who came in with facial swelling and progressive dyspnea with wheezing and labored breathing. He had remote hospitalization with hypoxemia and AKI.  In the ER he required 6L/min [place].  He had bloodwork showing severe renal failure with nephrotic range hypoalbuminemia and severe anemia. He received IV steroids and treatment for possible anaphylaxis on arrival.  He had CT chest with findings of lymphadenopathy including subcarinal node measuring 1.6 cm.  There is associated rounded lower lobe left upper lobe lingula lung opacity consistent with atelectasis.Additional lung opacity, also consistent with atelectasis, projects along oblique fissures, greater on the left. There is bilateral interstitial thickening.   05/07/24- patient has mild improvement in blood glucose measurements. His renal function is stable. His respiratory status is stable on 5l/min .  He is on prednisone , I have rduced dose to 30mg  po daily. He's on albumin  once daily.  Diagnostic left thoracentesis was performed showing no wbc/organissms noted. Today he is for repeat CXR and chest physiotherapy with recruitment maneuvers with metaneb TID.   PAST MEDICAL HISTORY   Past Medical History:  Diagnosis Date   Anxiety    Depression    Diabetes mellitus without complication (HCC)    Hepatitis C    Hyperlipidemia    IBS (irritable bowel syndrome)    Osteomyelitis (HCC)    Spinal stenosis      SURGICAL HISTORY   Past Surgical History:  Procedure Laterality Date   BACK SURGERY     bilateral amputation  Bilateral    CENTRAL LINE INSERTION-TUNNELED N/A 02/02/2019   Procedure: CENTRAL LINE INSERTION-TUNNELED;  Surgeon: Marea Selinda RAMAN, MD;  Location: [facility];  Service: Cardiovascular;  Laterality: N/A;   COLONOSCOPY WITH PROPOFOL  N/A 01/12/2020   Procedure: COLONOSCOPY WITH PROPOFOL ;  Surgeon: Jinny Carmine, MD;  Location: [facility] ENDOSCOPY;  Service: Endoscopy;  Laterality: N/A;   ESOPHAGOGASTRODUODENOSCOPY (EGD) WITH PROPOFOL  N/A 12/07/2019   Procedure: ESOPHAGOGASTRODUODENOSCOPY (EGD) WITH PROPOFOL ;  Surgeon: Unk Corinn Skiff, MD;  Location: [facility] ENDOSCOPY;  Service: Gastroenterology;  Laterality: N/A;   IR CATHETER TUBE CHANGE  12/04/2019   SPINAL FUSION     THORACIC SPINE SURGERY  01/2019   extensive washout     FAMILY HISTORY   History reviewed. No pertinent family history.   SOCIAL HISTORY   Social History   Tobacco Use   Smoking status: Some Days    Current packs/day: 0.25    Types: Cigarettes   Smokeless tobacco: Never  Vaping Use   Vaping status: Never Used  Substance Use Topics   Alcohol use: Not Currently   Drug use: Not Currently     MEDICATIONS    Home Medication:    Current Medication:  Current Facility-Administered Medications:    acetaminophen  (TYLENOL ) tablet 650 mg, 650 mg, Oral, Q6H PRN, 650 mg at 05/06/24 2132 **OR** acetaminophen  (TYLENOL ) suppository 650 mg, 650 mg, Rectal, Q6H PRN, Mansy, Jan A, MD   acetylcysteine  (MUCOMYST ) 20 % nebulizer / oral solution 4 mL, 4  mL, Nebulization, BID, Nadiyah Zeis, MD   albumin  human 25 % solution 12.5 g, 12.5 g, Intravenous, Daily, Zyrus Hetland, MD, Last Rate: 60 mL/hr at 05/07/24 1127, 12.5 g at 05/07/24 1127   alteplase  (CATHFLO ACTIVASE ) injection 2 mg, 2 mg, Intracatheter, Once, Von Bellis, MD   arformoterol  (BROVANA ) nebulizer solution 15 mcg, 15 mcg, Nebulization, BID, Von Bellis, MD, 15 mcg at 05/07/24 0759   bisacodyl  (DULCOLAX) EC tablet 10 mg, 10 mg, Oral, QHS, Von Bellis, MD, 10  mg at 05/05/24 2220   bisacodyl  (DULCOLAX) suppository 10 mg, 10 mg, Rectal, Daily PRN, Von Bellis, MD, 10 mg at 05/06/24 1225   Chlorhexidine  Gluconate Cloth 2 % PADS 6 each, 6 each, Topical, Daily, Von Bellis, MD, 6 each at 05/07/24 1127   cyanocobalamin  (VITAMIN B12) tablet 1,000 mcg, 1,000 mcg, Oral, Daily, Von Bellis, MD, 1,000 mcg at 05/07/24 1115   diphenhydrAMINE  (BENADRYL ) capsule 25 mg, 25 mg, Oral, Q6H, Mansy, Jan A, MD, 25 mg at 05/07/24 1115   famotidine  (PEPCID ) tablet 10 mg, 10 mg, Oral, Daily, Chappell, Alex B, RPH, 10 mg at 05/07/24 1115   feeding supplement (GLUCERNA SHAKE) (GLUCERNA SHAKE) liquid 237 mL, 237 mL, Oral, TID BM, Von Bellis, MD, 237 mL at 05/06/24 1341   folic acid  (FOLVITE ) tablet 1 mg, 1 mg, Oral, Daily, Von Bellis, MD, 1 mg at 05/07/24 1115   furosemide  (LASIX ) injection 80 mg, 80 mg, Intravenous, Q12H, Von Bellis, MD, 80 mg at 05/07/24 1116   gabapentin  (NEURONTIN ) capsule 300 mg, 300 mg, Oral, QHS, Mansy, Jan A, MD, 300 mg at 05/06/24 2132   heparin  injection 5,000 Units, 5,000 Units, Subcutaneous, Q8H, Jens Durand, MD, 5,000 Units at 05/07/24 9376   hydrALAZINE  (APRESOLINE ) injection 10 mg, 10 mg, Intravenous, Q6H PRN **OR** hydrALAZINE  (APRESOLINE ) tablet 50 mg, 50 mg, Oral, Q6H PRN, Von Bellis, MD   insulin  aspart (novoLOG ) injection 0-15 Units, 0-15 Units, Subcutaneous, TID WC, Ayiku, Bernard, MD, 8 Units at 05/07/24 9140   insulin  aspart (novoLOG ) injection 6 Units, 6 Units, Subcutaneous, TID WC, Von Bellis, MD   insulin  glargine-yfgn (SEMGLEE ) injection 18 Units, 18 Units, Subcutaneous, BID, Von Bellis, MD, 18 Units at 05/07/24 1204   ipratropium-albuterol  (DUONEB) 0.5-2.5 (3) MG/3ML nebulizer solution 3 mL, 3 mL, Nebulization, TID, Von Bellis, MD, 3 mL at 05/07/24 0759   levothyroxine  (SYNTHROID ) tablet 175 mcg, 175 mcg, Oral, Q0600, Mansy, Jan A, MD, 175 mcg at 05/07/24 9376   linaclotide  (LINZESS ) capsule 145 mcg, 145  mcg, Oral, QAC breakfast, Mansy, Jan A, MD, 145 mcg at 05/07/24 9152   linagliptin  (TRADJENTA ) tablet 5 mg, 5 mg, Oral, Daily, Mansy, Jan A, MD, 5 mg at 05/07/24 1115   melatonin tablet 10 mg, 10 mg, Oral, QHS, Mansy, Jan A, MD, 10 mg at 05/06/24 2132   metoCLOPramide  (REGLAN ) tablet 5 mg, 5 mg, Oral, TID AC, Mansy, Jan A, MD, 5 mg at 05/07/24 1115   metoprolol  succinate (TOPROL -XL) 24 hr tablet 25 mg, 25 mg, Oral, Daily, Mansy, Jan A, MD, 25 mg at 05/07/24 1114   ondansetron  (ZOFRAN ) tablet 4 mg, 4 mg, Oral, Q6H PRN, 4 mg at 05/03/24 1219 **OR** ondansetron  (ZOFRAN ) injection 4 mg, 4 mg, Intravenous, Q6H PRN, Mansy, Jan A, MD   oxyCODONE  (Oxy IR/ROXICODONE ) immediate release tablet 10 mg, 10 mg, Oral, Q8H PRN, Ayiku, Bernard, MD, 10 mg at 05/06/24 0901   polyethylene glycol (MIRALAX  / GLYCOLAX ) packet 17 g, 17 g, Oral, BID, Mansy, Jan A, MD, 17 g at  05/07/24 1115   [COMPLETED] methylPREDNISolone  sodium succinate (SOLU-MEDROL ) 40 mg/mL injection 40 mg, 40 mg, Intravenous, BID, 40 mg at 05/05/24 2219 **FOLLOWED BY** predniSONE  (DELTASONE ) tablet 40 mg, 40 mg, Oral, Q breakfast, Von Bellis, MD, 40 mg at 05/07/24 0847   Vitamin D  (Ergocalciferol ) (DRISDOL ) 1.25 MG (50000 UNIT) capsule 50,000 Units, 50,000 Units, Oral, Q7 days, Jens Durand, MD, 50,000 Units at 05/04/24 0820    ALLERGIES   Patient has no known allergies.     REVIEW OF SYSTEMS    Review of Systems:  Gen:  Denies  fever, sweats, chills weigh loss  HEENT: Denies blurred vision, double vision, ear pain, eye pain, hearing loss, nose bleeds, sore throat Cardiac:  No dizziness, chest pain or heaviness, chest tightness,edema Resp:   reports dyspnea chronically  Gi: Denies swallowing difficulty, stomach pain, nausea or vomiting, diarrhea, constipation, bowel incontinence Gu:  Denies bladder incontinence, burning urine Ext:   Denies Joint pain, stiffness or swelling Skin: Denies  skin rash, easy bruising or bleeding or  hives Endoc:  Denies polyuria, polydipsia , polyphagia or weight change Psych:   Denies depression, insomnia or hallucinations   Other:  All other systems negative   VS: BP (!) 177/82 (BP Location: Right Arm)   Pulse 74   Temp 97.7 F (36.5 C)   Resp 18   Ht 5' (1.524 m)   Wt 136 kg   SpO2 90%   BMI 58.56 kg/m      PHYSICAL EXAM    GENERAL:NAD, no fevers, chills, no weakness no fatigue HEAD: Normocephalic, atraumatic.  EYES: Pupils equal, round, reactive to light. Extraocular muscles intact. No scleral icterus.  MOUTH: Moist mucosal membrane. Dentition intact. No abscess noted.  EAR, NOSE, THROAT: Clear without exudates. No external lesions.  NECK: Supple. No thyromegaly. No nodules. No JVD.  PULMONARY: decreased breath sounds with mild rhonchi worse at bases bilaterally.  CARDIOVASCULAR: S1 and S2. Regular rate and rhythm. No murmurs, rubs, or gallops. No edema. Pedal pulses 2+ bilaterally.  GASTROINTESTINAL: Soft, nontender, nondistended. No masses. Positive bowel sounds. No hepatosplenomegaly.  MUSCULOSKELETAL: No swelling, clubbing, or edema. Range of motion full in all extremities.  NEUROLOGIC: Cranial nerves II through XII are intact. No gross focal neurological deficits. Sensation intact. Reflexes intact.  SKIN: No ulceration, lesions, rashes, or cyanosis. Skin warm and dry. Turgor intact.  PSYCHIATRIC: Mood, affect within normal limits. The patient is awake, alert and oriented x 3. Insight, judgment intact.       IMAGING   Narrative & Impression  CLINICAL DATA:  Acute onset dyspnea with associated facial swelling and associated wheezing. Multiple medical problems.   EXAM: CT CHEST WITHOUT CONTRAST   TECHNIQUE: Multidetector CT imaging of the chest was performed following the standard protocol without IV contrast.   RADIATION DOSE REDUCTION: This exam was performed according to the departmental dose-optimization program which includes automated exposure  control, adjustment of the mA and/or kV according to patient size and/or use of iterative reconstruction technique.   COMPARISON:  Chest radiographs, most recent 05/04/2024. Chest CT, 04/21/2024   FINDINGS: Cardiovascular: Heart is normal in size configuration. Three-vessel coronary artery calcifications. No pericardial effusion. Mildly enlarged main pulmonary artery, 3.5 cm. Aorta normal in caliber. Mild aortic atherosclerotic calcifications.   Mediastinum/Nodes: No neck base, mediastinal or hilar masses. Shotty neck base mediastinal lymph nodes, all subcentimeter with the exception of a subcarinal node measuring 1.6 cm in short axis. These are stable from the prior CT. Trachea esophagus are unremarkable.  Lungs/Pleura: Small left and trace right loculated appearing posterior pleural effusions. There is associated rounded lower lobe left upper lobe lingula lung opacity consistent with atelectasis. Additional lung opacity, also consistent with atelectasis, projects along oblique fissures, greater on the left. There is bilateral interstitial thickening. These findings are stable from the prior CT.   No pneumothorax.   Upper Abdomen: No acute findings.   Musculoskeletal: Previous fractures of T8-T9. Interbody fusion device extends from the lower endplate of T7 to the upper endplate of T10, well positioned and stable. There are well positioned and stable pedicle screws interconnecting rods spanning from T5 through T12. Mild old compression deformity of L1. No osteoblastic or osteolytic lesions.   There is diffuse subcutaneous soft tissue edema along the chest and visualized upper abdomen.   IMPRESSION: 1. No significant change from the recent prior chest CT. 2. Persistent small left and trace right loculated pleural effusions. Persistent rounded lung opacity in the lower lobes suspected be atelectasis, also noted the left upper lobe lingula and abutting the oblique  fissures. 3. Persistent bilateral interstitial thickening suspected to be interstitial pulmonary edema. There is associated diffuse subcutaneous soft tissue edema.   Aortic Atherosclerosis (ICD10-I70.0).     Electronically Signed   By: Alm Parkins M.D.   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   Patient/Family are satisfied with care plan and all questions have been answered.    Provider disclosure: Patient with at least one acute or chronic illness or injury that poses a threat to life or bodily function and is being managed actively during this encounter.  All of the below services have been performed independently by signing provider:  review of prior documentation from internal and or external health records.  Review of previous and current lab results.  Interview and comprehensive assessment during patient visit today. Review of current and previous chest radiographs/CT scans. Discussion of management and test interpretation with health care team and patient/family.   This document was prepared using Dragon  voice recognition software and may include unintentional dictation errors.     Ikeisha Blumberg, M.D.  Division of Pulmonary & Critical Care Medicine

## 2024-05-07 NOTE — Progress Notes (Signed)
 Progress Note    Eugene [name]  FMW:[id number] DOB: 08/01/1961  DOA: 05/02/2024 PCP: [facility]      Brief Narrative:    Medical records reviewed and are as summarized below:  Buzz Axel is a 63 y.o. male with medical history significant for anxiety, depression, type 2 diabetes mellitus, dyslipidemia, hepatitis C, spinal stenosis, IBS, osteomyelitis, status post bilateral BKA, who presented to the emergency room with acute onset of dyspnea with associated facial and periorbital swelling with associated wheezing. He said he was told by the staff at the nursing home but his face was swollen and he was probably having an allergic reaction.  He is not aware of any recent change in diet or medication that will trigger any allergic reaction.  No fever or chills.  The patient was discharged from here on 7/22 after being managed for AKI, catheter associated UTI, hyperkalemia, ileus and acute hypoxic respiratory failure.    Initial vital signs in the ED temperature 99.1 F, respiratory rate 20, pulse 74, BP 122/82, O2 sat 93% on 6 L of oxygen.  Chest x-ray impression:  Overall, unchanged appearance of the bilateral pleural effusions and mid and lower lung airspace disease, likely atelectasis    He was given 1 dose of EpiPen , IV steroids, IV famotidine  and IV fluids for suspected anaphylaxis.   Assessment/Plan:   Principal Problem:   Anaphylaxis Active Problems:   Type 2 diabetes mellitus with peripheral neuropathy (HCC)   Essential hypertension   Hypothyroidism   IBS (irritable bowel syndrome)   Acute on chronic diastolic CHF (congestive heart failure) (HCC)   CKD (chronic kidney disease) stage 4, GFR 15-29 ml/min (HCC)    Acute hypoxic respiratory failure:  Continue supplemental O2 inhalation and gradually wean off He may have underlying chronic hypoxic respiratory failure and require home O2 inhalation on discharge   COPD exacerbation. 8/12  Solu-Medrol  40 mg IV x 2 doses followed by prednisone  40 mg p.o. daily started Started Brovana  nebulizer twice daily, DuoNeb every 6 hourly scheduled CT chest showed pleural effusion and atelectasis 8/12 s/p left thoracentesis 100 mL fluid was tapped.  No WBC/organism, culture negative.  High LDH, low protein, partially exudative.    AKI on CKD stage IV, with proteinuria Patient is volume overload secondary to CKD Continue IV Lasix  as per nephro Monitor urine output and renal functions daily Patient may need hemodialysis in near future   Fluid overload, most likely due to CKD IV acute on chronic diastolic CHF: S/p IV Lasix  x 1 dose on 05/03/2024.   BNP 172.1. 2D echo on 04/19/2024 showed EF estimated at 55 to 60%, indeterminate LV diastolic parameters, left and right atrial were not assessed, technically difficult study. 8/13 increased Lasix  80 mg IV twice daily   Hyperkalemia secondary to renal failure 8/13 Lokelma  given.  Hyperkalemia resolved Monitor potassium level daily   Hypoalbuminemia likely contributing to fluid overload.  Albumin  1.9. Probable AKI on CKD stage IV versus progressive CKD stage IV: Creatinine slowly trending upward.   Consulted Dr. Marcelino, nephrologist, to assist with management.  Suspected anaphylactic reaction: S/p EpiPen  on 05/02/2024, IV Pepcid  and IV Solu-Medrol .  Difficult to rule in or exclude anaphylactic reaction at this time. Facial swelling was probably due to fluid overload.   Type II DM with hyperglycemia: IV steroids likely contributed to severe hyperglycemia. Hemoglobin A1c is 6.0 on 04/14/2024. 8/14 increased Semglee  18 units twice daily and NovoLog  6 units 3 times daily with meals Hyperglycemia due  to steroids   Comorbidities Peripheral neuropathy, hypertension, hypothyroidism, IBS, bilateral BKA  Recent discharge from the hospital on 05/01/2024 after hospitalization for AKI, catheter associated UTI, suspected pneumonia, acute hypoxic  respiratory failure, gastroparesis, ileus, chronic opioid use   Abdominal discomfort, could be gastroparesis versus ileus CT abdomen pelvis negative for any obstruction  # Vitamin D  deficiency: started vitamin D  50,000 units p.o. weekly, follow with PCP to repeat vitamin D  level after 3 to 6 months.  # Vitamin B12 level 240, at lower end, goal >400,  started vitamin B12 supplement to prevent deficiency.  Repeat B12 level after 3 to 6 months.  # Folic acid  level 6.8, at lower end, started folic acid  1 mg p.o. daily to prevent deficiency.  Repeat folic acid  level after 3 to 6 months.   Diet Order             Diet Carb Modified Fluid consistency: Thin; Room service appropriate? Yes; Fluid restriction: 1500 mL Fluid  Diet effective now                    Consultants: Nephrologist Pulmonologist  Procedures: 8/12 s/p left thoracentesis    Medications:    acetylcysteine   4 mL Nebulization BID   arformoterol   15 mcg Nebulization BID   bisacodyl   10 mg Oral QHS   Chlorhexidine  Gluconate Cloth  6 each Topical Daily   vitamin B-12  1,000 mcg Oral Daily   diphenhydrAMINE   25 mg Oral Q6H   famotidine   10 mg Oral Daily   feeding supplement (GLUCERNA SHAKE)  237 mL Oral TID BM   folic acid   1 mg Oral Daily   furosemide   80 mg Intravenous Q12H   gabapentin   300 mg Oral QHS   heparin  injection (subcutaneous)  5,000 Units Subcutaneous Q8H   insulin  aspart  0-15 Units Subcutaneous TID WC   insulin  aspart  6 Units Subcutaneous TID WC   insulin  glargine-yfgn  18 Units Subcutaneous BID   ipratropium-albuterol   3 mL Nebulization TID   levothyroxine   175 mcg Oral Q0600   linaclotide   145 mcg Oral QAC breakfast   linagliptin   5 mg Oral Daily   melatonin  10 mg Oral QHS   metoCLOPramide   5 mg Oral TID AC   metoprolol  succinate  25 mg Oral Daily   polyethylene glycol  17 g Oral BID   [START ON 05/08/2024] predniSONE   30 mg Oral Q breakfast   Vitamin D  (Ergocalciferol )  50,000 Units  Oral Q7 days   Continuous Infusions:  albumin  human Stopped (05/07/24 1304)      Anti-infectives (From admission, onward)    None         Family Communication/Anticipated D/C date and plan/Code Status   DVT prophylaxis: heparin  injection 5,000 Units Start: 05/03/24 2200     Code Status: Do not attempt resuscitation (DNR) PRE-ARREST INTERVENTIONS DESIRED  Family Communication: None Disposition Plan: Plan to discharge to SNF   Status is: Inpatient Remains inpatient appropriate because: AKI, acute respiratory failure     Subjective:   No significant events overnight.  Feels improvement in the shortness of breath, denied any chest pain or palpitations.  Patient is concerned about his diet.  Patient was advised to continue diabetic diet due to elevated blood glucose secondary to steroids.   Objective:    Vitals:   05/07/24 1200 05/07/24 1228 05/07/24 1231 05/07/24 1330  BP:  (!) 177/80 (!) 177/82   Pulse:   74   Resp:  18     Temp:  97.7 F (36.5 C)    TempSrc:      SpO2:  90%  91%  Weight:      Height:       No data found.   Intake/Output Summary (Last 24 hours) at 05/07/2024 1827 Last data filed at 05/07/2024 1800 Gross per 24 hour  Intake 285.83 ml  Output 3400 ml  Net -3114.17 ml   Filed Weights   05/03/24 2039 05/07/24 0600  Weight: (!) 137.6 kg 136 kg    Exam:   GEN: Mild to moderate distress due to SOB, appears hypervolemic, and tachypneic SKIN: Erythema of upper back EYES: No pallor or icterus ENT: MMM.  No tongue swelling, periorbital swelling, lip swelling no obvious facial swelling.   CV: RRR PULM: Equal air entry bilaterally, mild bibasilar crackles, no more wheezes resolved after steroids.   ABD: soft, ND, NT, +BS CNS: AAO x 3, non focal EXT: s/p BL BKA, edema of bilateral thighs  GU: Suprapubic catheter draining amber urine    Data Reviewed:   I have personally reviewed following labs and imaging studies:  Labs: Labs show  the following:   Basic Metabolic Panel: Recent Labs  Lab 05/03/24 1034 05/04/24 0532 05/05/24 0456 05/06/24 0523 05/07/24 0653  NA 135 135 137 135 136  K 5.0 5.1 4.9 5.4* 4.9  CL 105 104 107 102 102  CO2 22 23 22 24 26   GLUCOSE 403* 201* 203* 385* 285*  BUN 52* 60* 64* 68* 72*  CREATININE 4.23* 4.44* 4.44* 4.35* 4.34*  CALCIUM  7.3* 7.5* 7.4* 7.3* 7.4*  MG  --   --   --  1.9 2.0  PHOS  --  5.2* 5.2* 5.2* 5.3*   GFR Estimated Creatinine Clearance: 21.3 mL/min (A) (by C-G formula based on SCr of 4.34 mg/dL (H)). Liver Function Tests: Recent Labs  Lab 05/02/24 1926 05/04/24 0532 05/05/24 0456 05/06/24 0523 05/07/24 0653  AST 16  --   --  14* 19  ALT 14  --   --  13 15  ALKPHOS 48  --   --  41 41  BILITOT 0.4  --   --  0.3 0.3  PROT 5.6*  --   --  5.5* 5.3*  ALBUMIN  1.8* 1.9* 1.9* 2.0* 2.0*   Recent Labs  Lab 05/06/24 1817  LIPASE 25   No results for input(s): AMMONIA in the last 168 hours. Coagulation profile No results for input(s): INR, PROTIME in the last 168 hours.  CBC: Recent Labs  Lab 05/02/24 1926 05/03/24 1034 05/06/24 0523 05/07/24 0653  WBC 6.5 6.3 4.7 8.9  NEUTROABS 4.3  --   --   --   HGB 8.9* 9.3* 9.4* 9.3*  HCT 28.3* 28.9* 28.9* 29.5*  MCV 94.6 92.6 90.0 91.9  PLT 210 219 209 238   Cardiac Enzymes: No results for input(s): CKTOTAL, CKMB, CKMBINDEX, TROPONINI in the last 168 hours. BNP (last 3 results) No results for input(s): PROBNP in the last 8760 hours. CBG: Recent Labs  Lab 05/06/24 1228 05/06/24 1751 05/07/24 0850 05/07/24 1230 05/07/24 1731  GLUCAP 334* 303* 263* 329* 347*   D-Dimer: No results for input(s): DDIMER in the last 72 hours. Hgb A1c: No results for input(s): HGBA1C in the last 72 hours. Lipid Profile: No results for input(s): CHOL, HDL, LDLCALC, TRIG, CHOLHDL, LDLDIRECT in the last 72 hours. Thyroid function studies: No results for input(s): TSH, T4TOTAL, T3FREE,  THYROIDAB in the last 72  hours.  Invalid input(s): FREET3 Anemia work up: Recent Labs    05/05/24 0456  FOLATE 6.9   Sepsis Labs: Recent Labs  Lab 05/02/24 1926 05/03/24 1034 05/06/24 0523 05/07/24 0653  WBC 6.5 6.3 4.7 8.9    Microbiology Recent Results (from the past 240 hours)  Resp panel by RT-PCR (RSV, Flu A&B, Covid) Anterior Nasal Swab     Status: None   Collection Time: 05/02/24  8:06 PM   Specimen: Anterior Nasal Swab  Result Value Ref Range Status   SARS Coronavirus 2 by RT PCR NEGATIVE NEGATIVE Final    Comment: (NOTE) SARS-CoV-2 target nucleic acids are NOT DETECTED.  The SARS-CoV-2 RNA is generally detectable in upper respiratory specimens during the acute phase of infection. The lowest concentration of SARS-CoV-2 viral copies this assay can detect is 138 copies/mL. A negative result does not preclude SARS-Cov-2 infection and should not be used as the sole basis for treatment or other patient management decisions. A negative result may occur with  improper specimen collection/handling, submission of specimen other than nasopharyngeal swab, presence of viral mutation(s) within the areas targeted by this assay, and inadequate number of viral copies(<138 copies/mL). A negative result must be combined with clinical observations, patient history, and epidemiological information. The expected result is Negative.  Fact Sheet for Patients:  BloggerCourse.com  Fact Sheet for Healthcare Providers:  SeriousBroker.it  This test is no t yet approved or cleared by the [place]  FDA and  has been authorized for detection and/or diagnosis of SARS-CoV-2 by FDA under an Emergency Use Authorization (EUA). This EUA will remain  in effect (meaning this test can be used) for the duration of the COVID-19 declaration under Section 564(b)(1) of the Act, 21 U.S.C.section 360bbb-3(b)(1), unless the authorization is  terminated  or revoked sooner.       Influenza A by PCR NEGATIVE NEGATIVE Final   Influenza B by PCR NEGATIVE NEGATIVE Final    Comment: (NOTE) The Xpert Xpress SARS-CoV-2/FLU/RSV plus assay is intended as an aid in the diagnosis of influenza from Nasopharyngeal swab specimens and should not be used as a sole basis for treatment. Nasal washings and aspirates are unacceptable for Xpert Xpress SARS-CoV-2/FLU/RSV testing.  Fact Sheet for Patients: BloggerCourse.com  Fact Sheet for Healthcare Providers: SeriousBroker.it  This test is not yet approved or cleared by the [place]  FDA and has been authorized for detection and/or diagnosis of SARS-CoV-2 by FDA under an Emergency Use Authorization (EUA). This EUA will remain in effect (meaning this test can be used) for the duration of the COVID-19 declaration under Section 564(b)(1) of the Act, 21 U.S.C. section 360bbb-3(b)(1), unless the authorization is terminated or revoked.     Resp Syncytial Virus by PCR NEGATIVE NEGATIVE Final    Comment: (NOTE) Fact Sheet for Patients: BloggerCourse.com  Fact Sheet for Healthcare Providers: SeriousBroker.it  This test is not yet approved or cleared by the [place]  FDA and has been authorized for detection and/or diagnosis of SARS-CoV-2 by FDA under an Emergency Use Authorization (EUA). This EUA will remain in effect (meaning this test can be used) for the duration of the COVID-19 declaration under Section 564(b)(1) of the Act, 21 U.S.C. section 360bbb-3(b)(1), unless the authorization is terminated or revoked.  Performed at [facility], [address] [place], [place] [address]   Body fluid culture w Gram Stain     Status: None (Preliminary result)   Collection Time: 05/05/24  4:10 PM   Specimen: PATH Cytology Pleural fluid  Result Value Ref Range Status   Specimen  Description   Final    FLUID Performed at [facility], [address] [place], [place] [address]    Special Requests   Final    CYTO PLEU Performed at [facility], [address] [place], [place] [address]    Gram Stain NO WBC SEEN NO ORGANISMS SEEN CYTOSPIN SMEAR   Final   Culture   Final    NO GROWTH 1 DAY Performed at [facility], [address], [place], [place] [address]    Report Status PENDING  Incomplete    Procedures and diagnostic studies:  No results found.   Total time spent: 40 minutes   LOS: 4 days   Takahiro Godinho Von  [facility]   Pager on [email or web address]. If 7PM-7AM, please contact night-coverage at [email or web address]   05/07/2024, 6:27 PM

## 2024-05-07 NOTE — Progress Notes (Signed)
 Staff attempted to place harness to lift pt from bed and sit in chair for a while.  Pt refused harness.  Staff unable to lift pt to chair.

## 2024-05-08 DIAGNOSIS — E8779 Other fluid overload: Secondary | ICD-10-CM | POA: Diagnosis not present

## 2024-05-08 LAB — BASIC METABOLIC PANEL WITH GFR
Anion gap: 7 (ref 5–15)
BUN: 75 mg/dL — ABNORMAL HIGH (ref 8–23)
CO2: 26 mmol/L (ref 22–32)
Calcium: 7.5 mg/dL — ABNORMAL LOW (ref 8.9–10.3)
Chloride: 105 mmol/L (ref 98–111)
Creatinine, Ser: 4.39 mg/dL — ABNORMAL HIGH (ref 0.61–1.24)
GFR, Estimated: 15 mL/min — ABNORMAL LOW (ref >60)
Glucose, Bld: 240 mg/dL — ABNORMAL HIGH (ref 70–99)
Potassium: 5.1 mmol/L (ref 3.5–5.1)
Sodium: 138 mmol/L (ref 135–145)

## 2024-05-08 LAB — HEPATIC FUNCTION PANEL
ALT: 17 U/L (ref 0–44)
AST: 16 U/L (ref 15–41)
Albumin: 2.1 g/dL — ABNORMAL LOW (ref 3.5–5.0)
Alkaline Phosphatase: 48 U/L (ref 38–126)
Bilirubin, Direct: <0.1 mg/dL (ref 0.0–0.2)
Total Bilirubin: 0.7 mg/dL (ref 0.0–1.2)
Total Protein: 5.7 g/dL — ABNORMAL LOW (ref 6.5–8.1)

## 2024-05-08 LAB — CBC
HCT: 30.4 % — ABNORMAL LOW (ref 39.0–52.0)
Hemoglobin: 9.7 g/dL — ABNORMAL LOW (ref 13.0–17.0)
MCH: 29.3 pg (ref 26.0–34.0)
MCHC: 31.9 g/dL (ref 30.0–36.0)
MCV: 91.8 fL (ref 80.0–100.0)
Platelets: 236 K/uL (ref 150–400)
RBC: 3.31 MIL/uL — ABNORMAL LOW (ref 4.22–5.81)
RDW: 12.8 % (ref 11.5–15.5)
WBC: 8.2 K/uL (ref 4.0–10.5)
nRBC: 0 % (ref 0.0–0.2)

## 2024-05-08 LAB — GLUCOSE, CAPILLARY
Glucose-Capillary: 223 mg/dL — ABNORMAL HIGH (ref 70–99)
Glucose-Capillary: 246 mg/dL — ABNORMAL HIGH (ref 70–99)
Glucose-Capillary: 259 mg/dL — ABNORMAL HIGH (ref 70–99)
Glucose-Capillary: 285 mg/dL — ABNORMAL HIGH (ref 70–99)

## 2024-05-08 LAB — MAGNESIUM: Magnesium: 2.1 mg/dL (ref 1.7–2.4)

## 2024-05-08 LAB — PHOSPHORUS: Phosphorus: 5.5 mg/dL — ABNORMAL HIGH (ref 2.5–4.6)

## 2024-05-08 MED ORDER — TRAZODONE HCL 50 MG PO TABS
25.0000 mg | ORAL_TABLET | Freq: Every evening | ORAL | Status: DC | PRN
  Administered 2024-05-11 – 2024-05-17 (×5): 25 mg via ORAL
  Filled 2024-05-08 (×6): qty 1

## 2024-05-08 NOTE — Progress Notes (Signed)
 Progress Note    Eugene [name]  FMW:[id number] DOB: July 24, 1961  DOA: 05/02/2024 PCP: [facility]      Brief Narrative:    Medical records reviewed and are as summarized below:  Eugene [name] is a 63 y.o. male with medical history significant for anxiety, depression, type 2 diabetes mellitus, dyslipidemia, hepatitis C, spinal stenosis, IBS, osteomyelitis, status post bilateral BKA, who presented to the emergency room with acute onset of dyspnea with associated facial and periorbital swelling with associated wheezing. He said he was told by the staff at the nursing home but his face was swollen and he was probably having an allergic reaction.  He is not aware of any recent change in diet or medication that will trigger any allergic reaction.  No fever or chills.  The patient was discharged from here on 7/22 after being managed for AKI, catheter associated UTI, hyperkalemia, ileus and acute hypoxic respiratory failure.    Initial vital signs in the ED temperature 99.1 F, respiratory rate 20, pulse 74, BP 122/82, O2 sat 93% on 6 L of oxygen.  Chest x-ray impression:  Overall, unchanged appearance of the bilateral pleural effusions and mid and lower lung airspace disease, likely atelectasis    He was given 1 dose of EpiPen , IV steroids, IV famotidine  and IV fluids for suspected anaphylaxis.   Assessment/Plan:   Principal Problem:   Anaphylaxis Active Problems:   Type 2 diabetes mellitus with peripheral neuropathy (HCC)   Essential hypertension   Hypothyroidism   IBS (irritable bowel syndrome)   Acute on chronic diastolic CHF (congestive heart failure) (HCC)   CKD (chronic kidney disease) stage 4, GFR 15-29 ml/min (HCC)    Acute hypoxic respiratory failure:  Continue supplemental O2 inhalation and gradually wean off He may have underlying chronic hypoxic respiratory failure and require home O2 inhalation on discharge   COPD exacerbation. 8/12  Solu-Medrol  40 mg IV x 2 doses followed by prednisone  40 mg p.o. daily x 2 doses, 30 mg pod x 2 doses Started Brovana  nebulizer twice daily, DuoNeb every 6 hourly scheduled CT chest showed pleural effusion and atelectasis 8/12 s/p left thoracentesis 100 mL fluid was tapped.  No WBC/organism, culture negative.  High LDH, low protein, partially exudative.    AKI on CKD stage IV, with proteinuria Patient is volume overload secondary to CKD Continue IV Lasix  as per nephro Monitor urine output and renal functions daily Patient may need hemodialysis in near future   Fluid overload, most likely due to CKD IV acute on chronic diastolic CHF: S/p IV Lasix  x 1 dose on 05/03/2024.   BNP 172.1. 2D echo on 04/19/2024 showed EF estimated at 55 to 60%, indeterminate LV diastolic parameters, left and right atrial were not assessed, technically difficult study. 8/13 increased Lasix  80 mg IV twice daily   Hyperkalemia secondary to renal failure 8/13 Lokelma  given.  Hyperkalemia resolved Monitor potassium level daily   Hypoalbuminemia likely contributing to fluid overload.  Albumin  1.9. Probable AKI on CKD stage IV versus progressive CKD stage IV: Creatinine slowly trending upward.   Consulted Dr. Marcelino, nephrologist, to assist with management.  Suspected anaphylactic reaction: S/p EpiPen  on 05/02/2024, IV Pepcid  and IV Solu-Medrol .  Difficult to rule in or exclude anaphylactic reaction at this time. Facial swelling was probably due to fluid overload.   Type II DM with hyperglycemia: IV steroids likely contributed to severe hyperglycemia. Hemoglobin A1c is 6.0 on 04/14/2024. 8/14 increased Semglee  18 units twice daily and NovoLog  6  units 3 times daily with meals Hyperglycemia due to steroids   Comorbidities Peripheral neuropathy, hypertension, hypothyroidism, IBS, bilateral BKA  Recent discharge from the hospital on 05/01/2024 after hospitalization for AKI, catheter associated UTI, suspected  pneumonia, acute hypoxic respiratory failure, gastroparesis, ileus, chronic opioid use   Abdominal discomfort, could be gastroparesis versus ileus CT abdomen pelvis negative for any obstruction  # Vitamin D  deficiency: started vitamin D  50,000 units p.o. weekly, follow with PCP to repeat vitamin D  level after 3 to 6 months.  # Vitamin B12 level 240, at lower end, goal >400,  started vitamin B12 supplement to prevent deficiency.  Repeat B12 level after 3 to 6 months.  # Folic acid  level 6.8, at lower end, started folic acid  1 mg p.o. daily to prevent deficiency.  Repeat folic acid  level after 3 to 6 months.   Diet Order             Diet Carb Modified Fluid consistency: Thin; Room service appropriate? Yes; Fluid restriction: 1500 mL Fluid  Diet effective now                    Consultants: Nephrologist Pulmonologist  Procedures: 8/12 s/p left thoracentesis    Medications:    acetylcysteine   4 mL Nebulization BID   arformoterol   15 mcg Nebulization BID   bisacodyl   10 mg Oral QHS   Chlorhexidine  Gluconate Cloth  6 each Topical Daily   vitamin B-12  1,000 mcg Oral Daily   diphenhydrAMINE   25 mg Oral Q6H   famotidine   10 mg Oral Daily   feeding supplement (GLUCERNA SHAKE)  237 mL Oral TID BM   folic acid   1 mg Oral Daily   furosemide   80 mg Intravenous Q12H   gabapentin   300 mg Oral QHS   heparin  injection (subcutaneous)  5,000 Units Subcutaneous Q8H   insulin  aspart  0-15 Units Subcutaneous TID WC   insulin  aspart  6 Units Subcutaneous TID WC   insulin  glargine-yfgn  18 Units Subcutaneous BID   ipratropium-albuterol   3 mL Nebulization TID   levothyroxine   175 mcg Oral Q0600   linaclotide   145 mcg Oral QAC breakfast   linagliptin   5 mg Oral Daily   melatonin  10 mg Oral QHS   metoCLOPramide   5 mg Oral TID AC   metoprolol  succinate  25 mg Oral Daily   polyethylene glycol  17 g Oral BID   predniSONE   30 mg Oral Q breakfast   Vitamin D  (Ergocalciferol )  50,000  Units Oral Q7 days   Continuous Infusions:  albumin  human 12.5 g (05/08/24 1150)      Anti-infectives (From admission, onward)    None         Family Communication/Anticipated D/C date and plan/Code Status   DVT prophylaxis: heparin  injection 5,000 Units Start: 05/03/24 2200     Code Status: Do not attempt resuscitation (DNR) PRE-ARREST INTERVENTIONS DESIRED  Family Communication: None Disposition Plan: Plan to discharge to SNF   Status is: Inpatient Remains inpatient appropriate because: AKI, acute respiratory failure     Subjective:   No significant events overnight.  Breathing is better now, no discomfort, denied any chest pain or palpitations. Patient does not feel improvement in the generalized body swelling. Overall he is feeling good and resting comfortably.   Objective:    Vitals:   05/08/24 0752 05/08/24 1052 05/08/24 1331 05/08/24 1507  BP: 131/60 (!) 148/66  117/62  Pulse: (!) 46 (!) 35  Resp: 18 20    Temp: 98.6 F (37 C) 98.6 F (37 C)  98 F (36.7 C)  TempSrc:    Oral  SpO2: 97% 94% 92% 95%  Weight:      Height:       No data found.   Intake/Output Summary (Last 24 hours) at 05/08/2024 1534 Last data filed at 05/08/2024 1435 Gross per 24 hour  Intake 964.19 ml  Output 1800 ml  Net -835.81 ml   Filed Weights   05/03/24 2039 05/07/24 0600  Weight: (!) 137.6 kg 136 kg    Exam:   GEN: Mild to moderate distress due to SOB, appears hypervolemic, and tachypneic SKIN: Erythema of upper back EYES: No pallor or icterus ENT: MMM.  No tongue swelling, periorbital swelling, lip swelling no obvious facial swelling.   CV: RRR PULM: Equal air entry bilaterally, mild bibasilar crackles, no more wheezes resolved after steroids.   ABD: soft, ND, NT, +BS CNS: AAO x 3, non focal EXT: s/p BL BKA, edema of bilateral thighs and edema of arms more edema of left arm possibly due to dependent edema, advised to keep arms elevated GU: Suprapubic  catheter intact    Data Reviewed:   I have personally reviewed following labs and imaging studies:  Labs: Labs show the following:   Basic Metabolic Panel: Recent Labs  Lab 05/04/24 0532 05/05/24 0456 05/06/24 0523 05/07/24 0653 05/08/24 0520  NA 135 137 135 136 138  K 5.1 4.9 5.4* 4.9 5.1  CL 104 107 102 102 105  CO2 23 22 24 26 26   GLUCOSE 201* 203* 385* 285* 240*  BUN 60* 64* 68* 72* 75*  CREATININE 4.44* 4.44* 4.35* 4.34* 4.39*  CALCIUM  7.5* 7.4* 7.3* 7.4* 7.5*  MG  --   --  1.9 2.0 2.1  PHOS 5.2* 5.2* 5.2* 5.3* 5.5*   GFR Estimated Creatinine Clearance: 21.1 mL/min (A) (by C-G formula based on SCr of 4.39 mg/dL (H)). Liver Function Tests: Recent Labs  Lab 05/02/24 1926 05/04/24 0532 05/05/24 0456 05/06/24 0523 05/07/24 0653 05/08/24 0520  AST 16  --   --  14* 19 16  ALT 14  --   --  13 15 17   ALKPHOS 48  --   --  41 41 48  BILITOT 0.4  --   --  0.3 0.3 0.7  PROT 5.6*  --   --  5.5* 5.3* 5.7*  ALBUMIN  1.8* 1.9* 1.9* 2.0* 2.0* 2.1*   Recent Labs  Lab 05/06/24 1817  LIPASE 25   No results for input(s): AMMONIA in the last 168 hours. Coagulation profile No results for input(s): INR, PROTIME in the last 168 hours.  CBC: Recent Labs  Lab 05/02/24 1926 05/03/24 1034 05/06/24 0523 05/07/24 0653 05/08/24 0520  WBC 6.5 6.3 4.7 8.9 8.2  NEUTROABS 4.3  --   --   --   --   HGB 8.9* 9.3* 9.4* 9.3* 9.7*  HCT 28.3* 28.9* 28.9* 29.5* 30.4*  MCV 94.6 92.6 90.0 91.9 91.8  PLT 210 219 209 238 236   Cardiac Enzymes: No results for input(s): CKTOTAL, CKMB, CKMBINDEX, TROPONINI in the last 168 hours. BNP (last 3 results) No results for input(s): PROBNP in the last 8760 hours. CBG: Recent Labs  Lab 05/07/24 1230 05/07/24 1731 05/07/24 2138 05/08/24 0756 05/08/24 1155  GLUCAP 329* 347* 324* 223* 246*   D-Dimer: No results for input(s): DDIMER in the last 72 hours. Hgb A1c: No results for input(s): HGBA1C in  the last 72  hours. Lipid Profile: No results for input(s): CHOL, HDL, LDLCALC, TRIG, CHOLHDL, LDLDIRECT in the last 72 hours. Thyroid function studies: No results for input(s): TSH, T4TOTAL, T3FREE, THYROIDAB in the last 72 hours.  Invalid input(s): FREET3 Anemia work up: No results for input(s): VITAMINB12, FOLATE, FERRITIN, TIBC, IRON , RETICCTPCT in the last 72 hours.  Sepsis Labs: Recent Labs  Lab 05/03/24 1034 05/06/24 0523 05/07/24 0653 05/08/24 0520  WBC 6.3 4.7 8.9 8.2    Microbiology Recent Results (from the past 240 hours)  Resp panel by RT-PCR (RSV, Flu A&B, Covid) Anterior Nasal Swab     Status: None   Collection Time: 05/02/24  8:06 PM   Specimen: Anterior Nasal Swab  Result Value Ref Range Status   SARS Coronavirus 2 by RT PCR NEGATIVE NEGATIVE Final    Comment: (NOTE) SARS-CoV-2 target nucleic acids are NOT DETECTED.  The SARS-CoV-2 RNA is generally detectable in upper respiratory specimens during the acute phase of infection. The lowest concentration of SARS-CoV-2 viral copies this assay can detect is 138 copies/mL. A negative result does not preclude SARS-Cov-2 infection and should not be used as the sole basis for treatment or other patient management decisions. A negative result may occur with  improper specimen collection/handling, submission of specimen other than nasopharyngeal swab, presence of viral mutation(s) within the areas targeted by this assay, and inadequate number of viral copies(<138 copies/mL). A negative result must be combined with clinical observations, patient history, and epidemiological information. The expected result is Negative.  Fact Sheet for Patients:  BloggerCourse.com  Fact Sheet for Healthcare Providers:  SeriousBroker.it  This test is no t yet approved or cleared by the [place]  FDA and  has been authorized for detection and/or diagnosis of  SARS-CoV-2 by FDA under an Emergency Use Authorization (EUA). This EUA will remain  in effect (meaning this test can be used) for the duration of the COVID-19 declaration under Section 564(b)(1) of the Act, 21 U.S.C.section 360bbb-3(b)(1), unless the authorization is terminated  or revoked sooner.       Influenza A by PCR NEGATIVE NEGATIVE Final   Influenza B by PCR NEGATIVE NEGATIVE Final    Comment: (NOTE) The Xpert Xpress SARS-CoV-2/FLU/RSV plus assay is intended as an aid in the diagnosis of influenza from Nasopharyngeal swab specimens and should not be used as a sole basis for treatment. Nasal washings and aspirates are unacceptable for Xpert Xpress SARS-CoV-2/FLU/RSV testing.  Fact Sheet for Patients: BloggerCourse.com  Fact Sheet for Healthcare Providers: SeriousBroker.it  This test is not yet approved or cleared by the [place]  FDA and has been authorized for detection and/or diagnosis of SARS-CoV-2 by FDA under an Emergency Use Authorization (EUA). This EUA will remain in effect (meaning this test can be used) for the duration of the COVID-19 declaration under Section 564(b)(1) of the Act, 21 U.S.C. section 360bbb-3(b)(1), unless the authorization is terminated or revoked.     Resp Syncytial Virus by PCR NEGATIVE NEGATIVE Final    Comment: (NOTE) Fact Sheet for Patients: BloggerCourse.com  Fact Sheet for Healthcare Providers: SeriousBroker.it  This test is not yet approved or cleared by the [place]  FDA and has been authorized for detection and/or diagnosis of SARS-CoV-2 by FDA under an Emergency Use Authorization (EUA). This EUA will remain in effect (meaning this test can be used) for the duration of the COVID-19 declaration under Section 564(b)(1) of the Act, 21 U.S.C. section 360bbb-3(b)(1), unless the authorization is terminated  or revoked.  Performed  at [facility], [address] [place], [place] [address]   Body fluid culture w Gram Stain     Status: None (Preliminary result)   Collection Time: 05/05/24  4:10 PM   Specimen: PATH Cytology Pleural fluid  Result Value Ref Range Status   Specimen Description   Final    FLUID Performed at [facility], [address], [place], [place] [address]    Special Requests   Final    CYTO PLEU Performed at [facility], [address] [place], [place] [address]    Gram Stain NO WBC SEEN NO ORGANISMS SEEN CYTOSPIN SMEAR   Final   Culture   Final    NO GROWTH 2 DAYS Performed at [facility], [address] [place], [place] [address]    Report Status PENDING  Incomplete    Procedures and diagnostic studies:  DG Chest Port 1 View Result Date: 05/07/2024 CLINICAL DATA:  Atelectasis EXAM: PORTABLE CHEST 1 VIEW COMPARISON:  Chest x-ray 05/05/2024 FINDINGS: Small pleural effusions and bibasilar opacities have not significantly changed. The heart is enlarged, unchanged. There is no pneumothorax or acute fracture. Thoracolumbar fusion hardware is again seen. IMPRESSION: Stable small pleural effusions and bibasilar atelectasis/airspace disease. Electronically Signed   By: Greig Pique M.D.   On: 05/07/2024 20:33     Total time spent: 40 minutes   LOS: 5 days   Eugene [name]  [facility]   Pager on [email or web address]. If 7PM-7AM, please contact night-coverage at [email or web address]   05/08/2024, 3:34 PM

## 2024-05-08 NOTE — Progress Notes (Signed)
 [facility]  ROUNDING NOTE   Subjective:   Eugene [name]  is a 63 y.o.  male  with past medical conditions including diabetes, bilateral BKA, suprapubic catheter, and hepatitis C. Patient presents [facility] ED from SNF with facial edema and has been admitted for Wheezing [R06.2] Anaphylaxis [T78.2XXA] Facial swelling [R22.0] AKI (acute kidney injury) (HCC) [N17.9] Acute on chronic respiratory failure with hypoxia (HCC) [J96.21]  Patient is known to our practice from recent admission. He was released and pending follow up appt in our office at discharge.  Update  Patient seen laying in bed Alert and oriented Tolerating meals   Objective:  Vital signs in last 24 hours:  Temp:  [98.1 F (36.7 C)-98.6 F (37 C)] 98.6 F (37 C) (08/15 1052) Pulse Rate:  [35-77] 35 (08/15 1052) Resp:  [12-20] 20 (08/15 1052) BP: (131-158)/(60-102) 148/66 (08/15 1052) SpO2:  [91 %-97 %] 92 % (08/15 1331)  Weight change:  Filed Weights   05/03/24 2039 05/07/24 0600  Weight: (!) 137.6 kg 136 kg    Intake/Output: I/O last 3 completed shifts: In: 765.8 [P.O.:720; IV Piggyback:45.8] Out: 4100 [Urine:4100]   Intake/Output this shift:  Total I/O In: 240 [P.O.:240] Out: -   Physical Exam: General: NAD  Head: Normocephalic  Eyes: Anicteric  Neck: Supple  Lungs:  Diminished, 5LNC  Heart: Regular rate   Abdomen:  Distended and firm, obese  Extremities: Trace peripheral edema.,  Bilateral BKA  Neurologic: Awake and alert  Skin: No rashes  Access: None  Suprapubic catheter in place  Basic Metabolic Panel: Recent Labs  Lab 05/04/24 0532 05/05/24 0456 05/06/24 0523 05/07/24 0653 05/08/24 0520  NA 135 137 135 136 138  K 5.1 4.9 5.4* 4.9 5.1  CL 104 107 102 102 105  CO2 23 22 24 26 26   GLUCOSE 201* 203* 385* 285* 240*  BUN 60* 64* 68* 72* 75*  CREATININE 4.44* 4.44* 4.35* 4.34* 4.39*  CALCIUM  7.5* 7.4* 7.3* 7.4* 7.5*  MG  --   --  1.9 2.0 2.1  PHOS 5.2* 5.2* 5.2* 5.3*  5.5*    Liver Function Tests: Recent Labs  Lab 05/02/24 1926 05/04/24 0532 05/05/24 0456 05/06/24 0523 05/07/24 0653 05/08/24 0520  AST 16  --   --  14* 19 16  ALT 14  --   --  13 15 17   ALKPHOS 48  --   --  41 41 48  BILITOT 0.4  --   --  0.3 0.3 0.7  PROT 5.6*  --   --  5.5* 5.3* 5.7*  ALBUMIN  1.8* 1.9* 1.9* 2.0* 2.0* 2.1*   Recent Labs  Lab 05/06/24 1817  LIPASE 25   No results for input(s): AMMONIA in the last 168 hours.  CBC: Recent Labs  Lab 05/02/24 1926 05/03/24 1034 05/06/24 0523 05/07/24 0653 05/08/24 0520  WBC 6.5 6.3 4.7 8.9 8.2  NEUTROABS 4.3  --   --   --   --   HGB 8.9* 9.3* 9.4* 9.3* 9.7*  HCT 28.3* 28.9* 28.9* 29.5* 30.4*  MCV 94.6 92.6 90.0 91.9 91.8  PLT 210 219 209 238 236    Cardiac Enzymes: No results for input(s): CKTOTAL, CKMB, CKMBINDEX, TROPONINI in the last 168 hours.  BNP: Invalid input(s): POCBNP  CBG: Recent Labs  Lab 05/07/24 1230 05/07/24 1731 05/07/24 2138 05/08/24 0756 05/08/24 1155  GLUCAP 329* 347* 324* 223* 246*    Microbiology: Results for orders placed or performed during the hospital encounter of 05/02/24  Resp panel by RT-PCR (RSV, Flu A&B, Covid) Anterior Nasal Swab     Status: None   Collection Time: 05/02/24  8:06 PM   Specimen: Anterior Nasal Swab  Result Value Ref Range Status   SARS Coronavirus 2 by RT PCR NEGATIVE NEGATIVE Final    Comment: (NOTE) SARS-CoV-2 target nucleic acids are NOT DETECTED.  The SARS-CoV-2 RNA is generally detectable in upper respiratory specimens during the acute phase of infection. The lowest concentration of SARS-CoV-2 viral copies this assay can detect is 138 copies/mL. A negative result does not preclude SARS-Cov-2 infection and should not be used as the sole basis for treatment or other patient management decisions. A negative result may occur with  improper specimen collection/handling, submission of specimen other than nasopharyngeal swab, presence of  viral mutation(s) within the areas targeted by this assay, and inadequate number of viral copies(<138 copies/mL). A negative result must be combined with clinical observations, patient history, and epidemiological information. The expected result is Negative.  Fact Sheet for Patients:  BloggerCourse.com  Fact Sheet for Healthcare Providers:  SeriousBroker.it  This test is no t yet approved or cleared by the [place]  FDA and  has been authorized for detection and/or diagnosis of SARS-CoV-2 by FDA under an Emergency Use Authorization (EUA). This EUA will remain  in effect (meaning this test can be used) for the duration of the COVID-19 declaration under Section 564(b)(1) of the Act, 21 U.S.C.section 360bbb-3(b)(1), unless the authorization is terminated  or revoked sooner.       Influenza A by PCR NEGATIVE NEGATIVE Final   Influenza B by PCR NEGATIVE NEGATIVE Final    Comment: (NOTE) The Xpert Xpress SARS-CoV-2/FLU/RSV plus assay is intended as an aid in the diagnosis of influenza from Nasopharyngeal swab specimens and should not be used as a sole basis for treatment. Nasal washings and aspirates are unacceptable for Xpert Xpress SARS-CoV-2/FLU/RSV testing.  Fact Sheet for Patients: BloggerCourse.com  Fact Sheet for Healthcare Providers: SeriousBroker.it  This test is not yet approved or cleared by the [place]  FDA and has been authorized for detection and/or diagnosis of SARS-CoV-2 by FDA under an Emergency Use Authorization (EUA). This EUA will remain in effect (meaning this test can be used) for the duration of the COVID-19 declaration under Section 564(b)(1) of the Act, 21 U.S.C. section 360bbb-3(b)(1), unless the authorization is terminated or revoked.     Resp Syncytial Virus by PCR NEGATIVE NEGATIVE Final    Comment: (NOTE) Fact Sheet for  Patients: BloggerCourse.com  Fact Sheet for Healthcare Providers: SeriousBroker.it  This test is not yet approved or cleared by the [place]  FDA and has been authorized for detection and/or diagnosis of SARS-CoV-2 by FDA under an Emergency Use Authorization (EUA). This EUA will remain in effect (meaning this test can be used) for the duration of the COVID-19 declaration under Section 564(b)(1) of the Act, 21 U.S.C. section 360bbb-3(b)(1), unless the authorization is terminated or revoked.  Performed at [facility], [address] [place], [place] [address]   Body fluid culture w Gram Stain     Status: None (Preliminary result)   Collection Time: 05/05/24  4:10 PM   Specimen: PATH Cytology Pleural fluid  Result Value Ref Range Status   Specimen Description   Final    FLUID Performed at [facility], [address] [place], [place] [address]    Special Requests   Final    CYTO PLEU Performed at [facility], [address]  [place], [place] [address]    Gram Stain NO WBC SEEN NO ORGANISMS SEEN CYTOSPIN SMEAR   Final   Culture   Final    NO GROWTH 2 DAYS Performed at [facility], [address], [place], [place] [address]    Report Status PENDING  Incomplete    Coagulation Studies: No results for input(s): LABPROT, INR in the last 72 hours.  Urinalysis: No results for input(s): COLORURINE, LABSPEC, PHURINE, GLUCOSEU, HGBUR, BILIRUBINUR, KETONESUR, PROTEINUR, UROBILINOGEN, NITRITE, LEUKOCYTESUR in the last 72 hours.  Invalid input(s): APPERANCEUR    Imaging: DG Chest Port 1 View Result Date: 05/07/2024 CLINICAL DATA:  Atelectasis EXAM: PORTABLE CHEST 1 VIEW COMPARISON:  Chest x-ray 05/05/2024 FINDINGS: Small pleural effusions and bibasilar opacities have not significantly changed. The heart is enlarged, unchanged. There is no pneumothorax or acute  fracture. Thoracolumbar fusion hardware is again seen. IMPRESSION: Stable small pleural effusions and bibasilar atelectasis/airspace disease. Electronically Signed   By: Greig Pique M.D.   On: 05/07/2024 20:33     Medications:    albumin  human 12.5 g (05/08/24 1150)     acetylcysteine   4 mL Nebulization BID   arformoterol   15 mcg Nebulization BID   bisacodyl   10 mg Oral QHS   Chlorhexidine  Gluconate Cloth  6 each Topical Daily   vitamin B-12  1,000 mcg Oral Daily   diphenhydrAMINE   25 mg Oral Q6H   famotidine   10 mg Oral Daily   feeding supplement (GLUCERNA SHAKE)  237 mL Oral TID BM   folic acid   1 mg Oral Daily   furosemide   80 mg Intravenous Q12H   gabapentin   300 mg Oral QHS   heparin  injection (subcutaneous)  5,000 Units Subcutaneous Q8H   insulin  aspart  0-15 Units Subcutaneous TID WC   insulin  aspart  6 Units Subcutaneous TID WC   insulin  glargine-yfgn  18 Units Subcutaneous BID   ipratropium-albuterol   3 mL Nebulization TID   levothyroxine   175 mcg Oral Q0600   linaclotide   145 mcg Oral QAC breakfast   linagliptin   5 mg Oral Daily   melatonin  10 mg Oral QHS   metoCLOPramide   5 mg Oral TID AC   metoprolol  succinate  25 mg Oral Daily   polyethylene glycol  17 g Oral BID   predniSONE   30 mg Oral Q breakfast   Vitamin D  (Ergocalciferol )  50,000 Units Oral Q7 days   acetaminophen  **OR** acetaminophen , bisacodyl , hydrALAZINE  **OR** hydrALAZINE , ondansetron  **OR** ondansetron  (ZOFRAN ) IV, oxyCODONE , traZODone   Assessment/ Plan:  Eugene [name] is a 63 y.o.  male  with past medical conditions including diabetes, bilateral BKA, suprapubic catheter, hepatitis C, who was admitted to [facility] on 04/14/2024 for Suprapubic catheter (HCC) [Z93.59] AKI (acute kidney injury) (HCC) [N17.9] Acute kidney injury (HCC) [N17.9]    Acute kidney injury with proteinuria chronic kidney disease stage IV, creatinine 3.74 at time of previous discharge.  Creatinine 4.19 on admission.  Etiology  of AKI unclear at this time.  Concerning for progression of kidney.   Renal function stable. No acute need for dialysis. Concerned that patient would not qualify for outpatient dialysis due to inability to sit for long periods. He refused to get up with staff yesterday. Will attempt today.    Lab Results  Component Value Date   CREATININE 4.39 (H) 05/08/2024   CREATININE 4.34 (H) 05/07/2024   CREATININE 4.35 (H) 05/06/2024     Intake/Output Summary (Last 24 hours) at 05/08/2024 1359 Last data filed at 05/08/2024  1021 Gross per 24 hour  Intake 724.19 ml  Output 1800 ml  Net -1075.81 ml       2.  Hypertension with chronic kidney disease.  Currently prescribed amlodipine  and metoprolol .  Blood pressure 148/66  3.  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 Urine protein to creatinine ratio of 15.4 via Foley specimen.  Proteinuria likely secondary to diabetic nephropathy.  Hemoglobin A1c of 6.0% from 04/14/2024.  Previously diabetes control was suboptimal.  Glucose elevated at times.   4. Anemia of chronic kidney disease Lab Results  Component Value Date   HGB 9.7 (L) 05/08/2024    Hemoglobin at goal.  Will consider ESA at a later time.   LOS: 5 Eugene [name] 8/15/20251:59 PM

## 2024-05-08 NOTE — Inpatient Diabetes Management (Signed)
 Inpatient Diabetes Program Recommendations  AACE/ADA: New Consensus Statement on Inpatient Glycemic Control (2015)  Target Ranges:  Prepandial:   less than 140 mg/dL      Peak postprandial:   less than 180 mg/dL (1-2 hours)      Critically ill patients:  140 - 180 mg/dL    Latest Reference Range & Units 05/07/24 08:50 05/07/24 12:30 05/07/24 17:31 05/07/24 21:38  Glucose-Capillary 70 - 99 mg/dL 736 (H)  12 units Novolog   329 (H)  17 units Novolog   18 units Semglee   347 (H)  17 units Novolog   324 (H)     18 units Semglee   (H): Data is abnormally high  Latest Reference Range & Units 05/08/24 07:56 05/08/24 11:55  Glucose-Capillary 70 - 99 mg/dL 776 (H)  11 units Novolog   246 (H)  11 units Novolog   18 units Semglee   (H): Data is abnormally high   Home DM Meds: Semglee  10 units BID Humalog  0-15 units QID Januvia 100 mg daily      Current Orders: Semglee  18 units BID Novolog  6 units TID with meals Novolog  Moderate Correction Scale/ SSI (0-15 units) TID AC Tradjenta  5 mg daily    MD- Note CBGs remain >200.  Prednisone  reduced to 30 mg daily. May consider:  1. Increase Semglee  slightly to 20 units BID  2. Increase Novolog  Meal Coverage to 8 units TID with meals   --Will follow patient during hospitalization--  Adina Rudolpho Arrow RN, MSN, CDCES Diabetes Coordinator Inpatient Glycemic Control Team Team Pager: [phone] (8a-5p)

## 2024-05-08 NOTE — TOC Progression Note (Signed)
 Transition of Care [facility]) - Progression Note    Patient Details  Name: Eugene [name] MRN: [id number] Date of Birth: July 17, 1961  [facility] CM/SW Contact  Lauraine JAYSON Carpen, LCSW Phone Number: 05/08/2024, 4:18 PM  Clinical Narrative: Per MD, patient will be here over the weekend.    Expected Discharge Plan: Skilled Nursing Facility Barriers to Discharge: Continued Medical Work up               Expected Discharge Plan and Services     Post Acute Care Choice: Resumption of Svcs/PTA Provider Living arrangements for the past 2 months: Skilled Nursing Facility                                       Social Drivers of Health (SDOH) Interventions SDOH Screenings   Food Insecurity: Patient Declined (05/03/2024)  Housing: Low Risk  (05/03/2024)  Transportation Needs: No Transportation Needs (05/03/2024)  Financial Resource Strain: Medium Risk (02/02/2019)  Physical Activity: Unknown (02/02/2019)  Social Connections: Somewhat Isolated (02/02/2019)  Stress: Stress Concern Present (02/02/2019)  Tobacco Use: High Risk (05/02/2024)    Readmission Risk Interventions    05/05/2024    3:05 PM  Readmission Risk Prevention Plan  Transportation Screening Complete  Medication Review (RN Care Manager) Complete  PCP or Specialist appointment within 3-5 days of discharge Complete  SW Recovery Care/Counseling Consult Complete  Palliative Care Screening Not Applicable  Skilled Nursing Facility Complete

## 2024-05-08 NOTE — Plan of Care (Signed)

## 2024-05-08 NOTE — Progress Notes (Signed)
 PULMONOLOGY         Date: 05/08/2024,   MRN# [id number] Eugene [name] Mar 19, 1961     AdmissionWeight: (!) 137.6 kg                 CurrentWeight: 136 kg  Referring provider: Dr Von   CHIEF COMPLAINT:   Acute on chronic hypoxemic respiratory failure   HISTORY OF PRESENT ILLNESS   This is a 63 yo anxiety disorder, depression, HCV, dyslpidemia, IRS, osteomyelitis, end stage CKD, s/p bilateral amputations, who came in with facial swelling and progressive dyspnea with wheezing and labored breathing. He had remote hospitalization with hypoxemia and AKI.  In the ER he required 6L/min [place].  He had bloodwork showing severe renal failure with nephrotic range hypoalbuminemia and severe anemia. He received IV steroids and treatment for possible anaphylaxis on arrival.  He had CT chest with findings of lymphadenopathy including subcarinal node measuring 1.6 cm.  There is associated rounded lower lobe left upper lobe lingula lung opacity consistent with atelectasis.Additional lung opacity, also consistent with atelectasis, projects along oblique fissures, greater on the left. There is bilateral interstitial thickening.   05/08/24- patient still on 5L/min,  RT states he is non compliant with metaneb.  I have encouraged him to continue to work with us  to get his atelectatic lungs recruited and be able to wean oxygen.  He is agreeable and shares he will try again.  No changes in therapy from pulmonary today.   PAST MEDICAL HISTORY   Past Medical History:  Diagnosis Date   Anxiety    Depression    Diabetes mellitus without complication (HCC)    Hepatitis C    Hyperlipidemia    IBS (irritable bowel syndrome)    Osteomyelitis (HCC)    Spinal stenosis      SURGICAL HISTORY   Past Surgical History:  Procedure Laterality Date   BACK SURGERY     bilateral amputation Bilateral    CENTRAL LINE INSERTION-TUNNELED N/A 02/02/2019   Procedure: CENTRAL LINE INSERTION-TUNNELED;  Surgeon:  Marea Selinda RAMAN, MD;  Location: [facility];  Service: Cardiovascular;  Laterality: N/A;   COLONOSCOPY WITH PROPOFOL  N/A 01/12/2020   Procedure: COLONOSCOPY WITH PROPOFOL ;  Surgeon: Jinny Carmine, MD;  Location: [facility];  Service: Endoscopy;  Laterality: N/A;   ESOPHAGOGASTRODUODENOSCOPY (EGD) WITH PROPOFOL  N/A 12/07/2019   Procedure: ESOPHAGOGASTRODUODENOSCOPY (EGD) WITH PROPOFOL ;  Surgeon: Unk Corinn Skiff, MD;  Location: [facility];  Service: Gastroenterology;  Laterality: N/A;   IR CATHETER TUBE CHANGE  12/04/2019   SPINAL FUSION     THORACIC SPINE SURGERY  01/2019   extensive washout     FAMILY HISTORY   History reviewed. No pertinent family history.   SOCIAL HISTORY   Social History   Tobacco Use   Smoking status: Some Days    Current packs/day: 0.25    Types: Cigarettes   Smokeless tobacco: Never  Vaping Use   Vaping status: Never Used  Substance Use Topics   Alcohol use: Not Currently   Drug use: Not Currently     MEDICATIONS    Home Medication:    Current Medication:  Current Facility-Administered Medications:    acetaminophen  (TYLENOL ) tablet 650 mg, 650 mg, Oral, Q6H PRN, 650 mg at 05/07/24 2217 **OR** acetaminophen  (TYLENOL ) suppository 650 mg, 650 mg, Rectal, Q6H PRN, Mansy, Jan A, MD   acetylcysteine  (MUCOMYST ) 20 % nebulizer / oral solution 4 mL, 4 mL, Nebulization, BID, Nefi Musich, MD   albumin  human  25 % solution 12.5 g, 12.5 g, Intravenous, Daily, Agueda Houpt, MD, Stopped at 05/07/24 1304   arformoterol  (BROVANA ) nebulizer solution 15 mcg, 15 mcg, Nebulization, BID, Von Bellis, MD, 15 mcg at 05/08/24 0754   bisacodyl  (DULCOLAX) EC tablet 10 mg, 10 mg, Oral, QHS, Von Bellis, MD, 10 mg at 05/07/24 2217   bisacodyl  (DULCOLAX) suppository 10 mg, 10 mg, Rectal, Daily PRN, Von Bellis, MD, 10 mg at 05/06/24 1225   Chlorhexidine  Gluconate Cloth 2 % PADS 6 each, 6 each, Topical, Daily, Von Bellis, MD, 6 each at 05/07/24  1127   cyanocobalamin  (VITAMIN B12) tablet 1,000 mcg, 1,000 mcg, Oral, Daily, Kumar, Dileep, MD, 1,000 mcg at 05/07/24 1115   diphenhydrAMINE  (BENADRYL ) capsule 25 mg, 25 mg, Oral, Q6H, Mansy, Jan A, MD, 25 mg at 05/08/24 0600   famotidine  (PEPCID ) tablet 10 mg, 10 mg, Oral, Daily, Chappell, Alex B, RPH, 10 mg at 05/07/24 1115   feeding supplement (GLUCERNA SHAKE) (GLUCERNA SHAKE) liquid 237 mL, 237 mL, Oral, TID BM, Von Bellis, MD, 237 mL at 05/07/24 2219   folic acid  (FOLVITE ) tablet 1 mg, 1 mg, Oral, Daily, Von Bellis, MD, 1 mg at 05/07/24 1115   furosemide  (LASIX ) injection 80 mg, 80 mg, Intravenous, Q12H, Von Bellis, MD, 80 mg at 05/07/24 2218   gabapentin  (NEURONTIN ) capsule 300 mg, 300 mg, Oral, QHS, Mansy, Jan A, MD, 300 mg at 05/07/24 2217   heparin  injection 5,000 Units, 5,000 Units, Subcutaneous, Q8H, Jens Durand, MD, 5,000 Units at 05/08/24 0600   hydrALAZINE  (APRESOLINE ) injection 10 mg, 10 mg, Intravenous, Q6H PRN **OR** hydrALAZINE  (APRESOLINE ) tablet 50 mg, 50 mg, Oral, Q6H PRN, Von Bellis, MD   insulin  aspart (novoLOG ) injection 0-15 Units, 0-15 Units, Subcutaneous, TID WC, Ayiku, Bernard, MD, 5 Units at 05/08/24 9177   insulin  aspart (novoLOG ) injection 6 Units, 6 Units, Subcutaneous, TID WC, Von Bellis, MD, 6 Units at 05/08/24 9177   insulin  glargine-yfgn (SEMGLEE ) injection 18 Units, 18 Units, Subcutaneous, BID, Von Bellis, MD, 18 Units at 05/07/24 2216   ipratropium-albuterol  (DUONEB) 0.5-2.5 (3) MG/3ML nebulizer solution 3 mL, 3 mL, Nebulization, TID, Von Bellis, MD, 3 mL at 05/08/24 0754   levothyroxine  (SYNTHROID ) tablet 175 mcg, 175 mcg, Oral, Q0600, Mansy, Jan A, MD, 175 mcg at 05/08/24 0559   linaclotide  (LINZESS ) capsule 145 mcg, 145 mcg, Oral, QAC breakfast, Mansy, Jan A, MD, 145 mcg at 05/08/24 9175   linagliptin  (TRADJENTA ) tablet 5 mg, 5 mg, Oral, Daily, Mansy, Jan A, MD, 5 mg at 05/07/24 1115   melatonin tablet 10 mg, 10 mg, Oral, QHS, Mansy,  Jan A, MD, 10 mg at 05/07/24 2217   metoCLOPramide  (REGLAN ) tablet 5 mg, 5 mg, Oral, TID AC, Mansy, Jan A, MD, 5 mg at 05/08/24 9176   metoprolol  succinate (TOPROL -XL) 24 hr tablet 25 mg, 25 mg, Oral, Daily, Mansy, Jan A, MD, 25 mg at 05/07/24 1114   ondansetron  (ZOFRAN ) tablet 4 mg, 4 mg, Oral, Q6H PRN, 4 mg at 05/03/24 1219 **OR** ondansetron  (ZOFRAN ) injection 4 mg, 4 mg, Intravenous, Q6H PRN, Mansy, Jan A, MD, 4 mg at 05/07/24 2246   oxyCODONE  (Oxy IR/ROXICODONE ) immediate release tablet 10 mg, 10 mg, Oral, Q8H PRN, Ayiku, Bernard, MD, 10 mg at 05/08/24 0827   polyethylene glycol (MIRALAX  / GLYCOLAX ) packet 17 g, 17 g, Oral, BID, Mansy, Jan A, MD, 17 g at 05/07/24 1115   [COMPLETED] methylPREDNISolone  sodium succinate (SOLU-MEDROL ) 40 mg/mL injection 40 mg, 40 mg, Intravenous, BID, 40 mg at 05/05/24 2219 **  FOLLOWED BY** predniSONE  (DELTASONE ) tablet 30 mg, 30 mg, Oral, Q breakfast, Jersee Winiarski, MD, 30 mg at 05/08/24 9176   Vitamin D  (Ergocalciferol ) (DRISDOL ) 1.25 MG (50000 UNIT) capsule 50,000 Units, 50,000 Units, Oral, Q7 days, Jens Durand, MD, 50,000 Units at 05/04/24 0820    ALLERGIES   Patient has no known allergies.     REVIEW OF SYSTEMS    Review of Systems:  Gen:  Denies  fever, sweats, chills weigh loss  HEENT: Denies blurred vision, double vision, ear pain, eye pain, hearing loss, nose bleeds, sore throat Cardiac:  No dizziness, chest pain or heaviness, chest tightness,edema Resp:   reports dyspnea chronically  Gi: Denies swallowing difficulty, stomach pain, nausea or vomiting, diarrhea, constipation, bowel incontinence Gu:  Denies bladder incontinence, burning urine Ext:   Denies Joint pain, stiffness or swelling Skin: Denies  skin rash, easy bruising or bleeding or hives Endoc:  Denies polyuria, polydipsia , polyphagia or weight change Psych:   Denies depression, insomnia or hallucinations   Other:  All other systems negative   VS: BP 131/60 (BP Location:  Right Arm)   Pulse (!) 46   Temp 98.6 F (37 C)   Resp 18   Ht 5' (1.524 m)   Wt 136 kg   SpO2 97%   BMI 58.56 kg/m      PHYSICAL EXAM    GENERAL:NAD, no fevers, chills, no weakness no fatigue HEAD: Normocephalic, atraumatic.  EYES: Pupils equal, round, reactive to light. Extraocular muscles intact. No scleral icterus.  MOUTH: Moist mucosal membrane. Dentition intact. No abscess noted.  EAR, NOSE, THROAT: Clear without exudates. No external lesions.  NECK: Supple. No thyromegaly. No nodules. No JVD.  PULMONARY: decreased breath sounds with mild rhonchi worse at bases bilaterally.  CARDIOVASCULAR: S1 and S2. Regular rate and rhythm. No murmurs, rubs, or gallops. No edema. Pedal pulses 2+ bilaterally.  GASTROINTESTINAL: Soft, nontender, nondistended. No masses. Positive bowel sounds. No hepatosplenomegaly.  MUSCULOSKELETAL: No swelling, clubbing, or edema. Range of motion full in all extremities.  NEUROLOGIC: Cranial nerves II through XII are intact. No gross focal neurological deficits. Sensation intact. Reflexes intact.  SKIN: No ulceration, lesions, rashes, or cyanosis. Skin warm and dry. Turgor intact.  PSYCHIATRIC: Mood, affect within normal limits. The patient is awake, alert and oriented x 3. Insight, judgment intact.       IMAGING   Narrative & Impression  CLINICAL DATA:  Acute onset dyspnea with associated facial swelling and associated wheezing. Multiple medical problems.   EXAM: CT CHEST WITHOUT CONTRAST   TECHNIQUE: Multidetector CT imaging of the chest was performed following the standard protocol without IV contrast.   RADIATION DOSE REDUCTION: This exam was performed according to the departmental dose-optimization program which includes automated exposure control, adjustment of the mA and/or kV according to patient size and/or use of iterative reconstruction technique.   COMPARISON:  Chest radiographs, most recent 05/04/2024. Chest CT, 04/21/2024    FINDINGS: Cardiovascular: Heart is normal in size configuration. Three-vessel coronary artery calcifications. No pericardial effusion. Mildly enlarged main pulmonary artery, 3.5 cm. Aorta normal in caliber. Mild aortic atherosclerotic calcifications.   Mediastinum/Nodes: No neck base, mediastinal or hilar masses. Shotty neck base mediastinal lymph nodes, all subcentimeter with the exception of a subcarinal node measuring 1.6 cm in short axis. These are stable from the prior CT. Trachea esophagus are unremarkable.   Lungs/Pleura: Small left and trace right loculated appearing posterior pleural effusions. There is associated rounded lower lobe left upper lobe lingula lung  opacity consistent with atelectasis. Additional lung opacity, also consistent with atelectasis, projects along oblique fissures, greater on the left. There is bilateral interstitial thickening. These findings are stable from the prior CT.   No pneumothorax.   Upper Abdomen: No acute findings.   Musculoskeletal: Previous fractures of T8-T9. Interbody fusion device extends from the lower endplate of T7 to the upper endplate of T10, well positioned and stable. There are well positioned and stable pedicle screws interconnecting rods spanning from T5 through T12. Mild old compression deformity of L1. No osteoblastic or osteolytic lesions.   There is diffuse subcutaneous soft tissue edema along the chest and visualized upper abdomen.   IMPRESSION: 1. No significant change from the recent prior chest CT. 2. Persistent small left and trace right loculated pleural effusions. Persistent rounded lung opacity in the lower lobes suspected be atelectasis, also noted the left upper lobe lingula and abutting the oblique fissures. 3. Persistent bilateral interstitial thickening suspected to be interstitial pulmonary edema. There is associated diffuse subcutaneous soft tissue edema.   Aortic Atherosclerosis (ICD10-I70.0).      Electronically Signed   By: Alm Parkins M.D.   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   Patient/Family are satisfied with care plan and all questions have been answered.    Provider disclosure: Patient with at least one acute or chronic illness or injury that poses a threat to life or bodily function and is being managed actively during this encounter.  All of the below services have been performed independently by signing provider:  review of prior documentation from internal and or external health records.  Review of previous and current lab results.  Interview and comprehensive assessment during patient visit today. Review of current and previous chest radiographs/CT scans. Discussion of management and test interpretation with health care team and patient/family.   This document was prepared using Dragon voice recognition software and may include unintentional dictation errors.     Dlynn Ranes, M.D.  Division of Pulmonary & Critical Care Medicine

## 2024-05-09 DIAGNOSIS — N049 Nephrotic syndrome with unspecified morphologic changes: Secondary | ICD-10-CM

## 2024-05-09 LAB — BODY FLUID CULTURE W GRAM STAIN
Culture: NO GROWTH
Gram Stain: NONE SEEN

## 2024-05-09 LAB — GLUCOSE, CAPILLARY
Glucose-Capillary: 180 mg/dL — ABNORMAL HIGH (ref 70–99)
Glucose-Capillary: 199 mg/dL — ABNORMAL HIGH (ref 70–99)
Glucose-Capillary: 242 mg/dL — ABNORMAL HIGH (ref 70–99)
Glucose-Capillary: 286 mg/dL — ABNORMAL HIGH (ref 70–99)

## 2024-05-09 LAB — BASIC METABOLIC PANEL WITH GFR
Anion gap: 8 (ref 5–15)
BUN: 75 mg/dL — ABNORMAL HIGH (ref 8–23)
CO2: 24 mmol/L (ref 22–32)
Calcium: 7.1 mg/dL — ABNORMAL LOW (ref 8.9–10.3)
Chloride: 105 mmol/L (ref 98–111)
Creatinine, Ser: 4 mg/dL — ABNORMAL HIGH (ref 0.61–1.24)
GFR, Estimated: 16 mL/min — ABNORMAL LOW (ref >60)
Glucose, Bld: 244 mg/dL — ABNORMAL HIGH (ref 70–99)
Potassium: 4.8 mmol/L (ref 3.5–5.1)
Sodium: 137 mmol/L (ref 135–145)

## 2024-05-09 LAB — CBC
HCT: 29.1 % — ABNORMAL LOW (ref 39.0–52.0)
Hemoglobin: 9.2 g/dL — ABNORMAL LOW (ref 13.0–17.0)
MCH: 29.1 pg (ref 26.0–34.0)
MCHC: 31.6 g/dL (ref 30.0–36.0)
MCV: 92.1 fL (ref 80.0–100.0)
Platelets: 195 K/uL (ref 150–400)
RBC: 3.16 MIL/uL — ABNORMAL LOW (ref 4.22–5.81)
RDW: 12.4 % (ref 11.5–15.5)
WBC: 8 K/uL (ref 4.0–10.5)
nRBC: 0 % (ref 0.0–0.2)

## 2024-05-09 LAB — MAGNESIUM: Magnesium: 1.9 mg/dL (ref 1.7–2.4)

## 2024-05-09 LAB — PHOSPHORUS: Phosphorus: 4.6 mg/dL (ref 2.5–4.6)

## 2024-05-09 MED ORDER — IPRATROPIUM-ALBUTEROL 0.5-2.5 (3) MG/3ML IN SOLN
3.0000 mL | Freq: Two times a day (BID) | RESPIRATORY_TRACT | Status: DC
  Administered 2024-05-10 – 2024-05-18 (×16): 3 mL via RESPIRATORY_TRACT
  Filled 2024-05-09 (×17): qty 3

## 2024-05-09 NOTE — Plan of Care (Signed)
  Problem: Skin Integrity: Goal: Risk for impaired skin integrity will decrease Outcome: Progressing   Problem: Clinical Measurements: Goal: Respiratory complications will improve Outcome: Progressing Goal: Cardiovascular complication will be avoided Outcome: Progressing   Problem: Activity: Goal: Risk for activity intolerance will decrease Outcome: Progressing   Problem: Pain Managment: Goal: General experience of comfort will improve and/or be controlled Outcome: Progressing   Problem: Safety: Goal: Ability to remain free from injury will improve Outcome: Progressing

## 2024-05-09 NOTE — Progress Notes (Signed)
 PULMONOLOGY         Date: 05/09/2024,   MRN# [id number] Eugene [name] Sep 15, 1961     AdmissionWeight: (!) 137.6 kg                 CurrentWeight: 136 kg  Referring provider: Dr Von   CHIEF COMPLAINT:   Acute on chronic hypoxemic respiratory failure   HISTORY OF PRESENT ILLNESS   This is a 63 yo anxiety disorder, depression, HCV, dyslpidemia, IRS, osteomyelitis, end stage CKD, s/p bilateral amputations, who came in with facial swelling and progressive dyspnea with wheezing and labored breathing. He had remote hospitalization with hypoxemia and AKI.  In the ER he required 6L/min [place].  He had bloodwork showing severe renal failure with nephrotic range hypoalbuminemia and severe anemia. He received IV steroids and treatment for possible anaphylaxis on arrival.  He had CT chest with findings of lymphadenopathy including subcarinal node measuring 1.6 cm.  There is associated rounded lower lobe left upper lobe lingula lung opacity consistent with atelectasis.Additional lung opacity, also consistent with atelectasis, projects along oblique fissures, greater on the left. There is bilateral interstitial thickening.   05/09/24- patietn reports severe leg pain which is chronic.  Patient is on 4L/min.  He is slow to improve with CKD/atelectasis  PAST MEDICAL HISTORY   Past Medical History:  Diagnosis Date   Anxiety    Depression    Diabetes mellitus without complication (HCC)    Hepatitis C    Hyperlipidemia    IBS (irritable bowel syndrome)    Osteomyelitis (HCC)    Spinal stenosis      SURGICAL HISTORY   Past Surgical History:  Procedure Laterality Date   BACK SURGERY     bilateral amputation Bilateral    CENTRAL LINE INSERTION-TUNNELED N/A 02/02/2019   Procedure: CENTRAL LINE INSERTION-TUNNELED;  Surgeon: Marea Selinda RAMAN, MD;  Location: [facility];  Service: Cardiovascular;  Laterality: N/A;   COLONOSCOPY WITH PROPOFOL  N/A 01/12/2020   Procedure: COLONOSCOPY  WITH PROPOFOL ;  Surgeon: Jinny Carmine, MD;  Location: [facility];  Service: Endoscopy;  Laterality: N/A;   ESOPHAGOGASTRODUODENOSCOPY (EGD) WITH PROPOFOL  N/A 12/07/2019   Procedure: ESOPHAGOGASTRODUODENOSCOPY (EGD) WITH PROPOFOL ;  Surgeon: Unk Corinn Skiff, MD;  Location: [facility] ENDOS[facility];  Service: Gastroenterology;  Laterality: N/A;   IR CATHETER TUBE CHANGE  12/04/2019   SPINAL FUSION     THORACIC SPINE SURGERY  01/2019   extensive washout     FAMILY HISTORY   History reviewed. No pertinent family history.   SOCIAL HISTORY   Social History   Tobacco Use   Smoking status: Some Days    Current packs/day: 0.25    Types: Cigarettes   Smokeless tobacco: Never  Vaping Use   Vaping status: Never Used  Substance Use Topics   Alcohol use: Not Currently   Drug use: Not Currently     MEDICATIONS    Home Medication:    Current Medication:  Current Facility-Administered Medications:    acetaminophen  (TYLENOL ) tablet 650 mg, 650 mg, Oral, Q6H PRN, 650 mg at 05/07/24 2217 **OR** acetaminophen  (TYLENOL ) suppository 650 mg, 650 mg, Rectal, Q6H PRN, Mansy, Jan A, MD   acetylcysteine  (MUCOMYST ) 20 % nebulizer / oral solution 4 mL, 4 mL, Nebulization, BID, Jermey Closs, MD   albumin  human 25 % solution 12.5 g, 12.5 g, Intravenous, Daily, Jaquitta Dupriest, MD, Last Rate: 60 mL/hr at 05/09/24 0935, 12.5 g at 05/09/24 0935   arformoterol  (BROVANA ) nebulizer solution 15 mcg, 15 mcg,  Nebulization, BID, Von Bellis, MD, 15 mcg at 05/09/24 0636   bisacodyl  (DULCOLAX) EC tablet 10 mg, 10 mg, Oral, QHS, Von Bellis, MD, 10 mg at 05/08/24 2217   bisacodyl  (DULCOLAX) suppository 10 mg, 10 mg, Rectal, Daily PRN, Von Bellis, MD, 10 mg at 05/06/24 1225   Chlorhexidine  Gluconate Cloth 2 % PADS 6 each, 6 each, Topical, Daily, Von Bellis, MD, 6 each at 05/08/24 0920   cyanocobalamin  (VITAMIN B12) tablet 1,000 mcg, 1,000 mcg, Oral, Daily, Von Bellis, MD, 1,000 mcg at 05/09/24 9077    diphenhydrAMINE  (BENADRYL ) capsule 25 mg, 25 mg, Oral, Q6H, Mansy, Jan A, MD, 25 mg at 05/09/24 0609   famotidine  (PEPCID ) tablet 10 mg, 10 mg, Oral, Daily, Chappell, Alex B, RPH, 10 mg at 05/09/24 9077   feeding supplement (GLUCERNA SHAKE) (GLUCERNA SHAKE) liquid 237 mL, 237 mL, Oral, TID BM, Von Bellis, MD, 237 mL at 05/08/24 2217   folic acid  (FOLVITE ) tablet 1 mg, 1 mg, Oral, Daily, Von Bellis, MD, 1 mg at 05/09/24 9078   furosemide  (LASIX ) injection 80 mg, 80 mg, Intravenous, Q12H, Von Bellis, MD, 80 mg at 05/09/24 9074   gabapentin  (NEURONTIN ) capsule 300 mg, 300 mg, Oral, QHS, Mansy, Jan A, MD, 300 mg at 05/08/24 2216   heparin  injection 5,000 Units, 5,000 Units, Subcutaneous, Q8H, Jens Durand, MD, 5,000 Units at 05/09/24 0609   hydrALAZINE  (APRESOLINE ) injection 10 mg, 10 mg, Intravenous, Q6H PRN, 10 mg at 05/09/24 0808 **OR** hydrALAZINE  (APRESOLINE ) tablet 50 mg, 50 mg, Oral, Q6H PRN, Von Bellis, MD   insulin  aspart (novoLOG ) injection 0-15 Units, 0-15 Units, Subcutaneous, TID WC, Ayiku, Bernard, MD, 3 Units at 05/09/24 9072   insulin  aspart (novoLOG ) injection 6 Units, 6 Units, Subcutaneous, TID WC, Von Bellis, MD, 6 Units at 05/09/24 9073   insulin  glargine-yfgn (SEMGLEE ) injection 18 Units, 18 Units, Subcutaneous, BID, Von Bellis, MD, 18 Units at 05/09/24 0920   ipratropium-albuterol  (DUONEB) 0.5-2.5 (3) MG/3ML nebulizer solution 3 mL, 3 mL, Nebulization, TID, Von Bellis, MD, 3 mL at 05/09/24 0636   levothyroxine  (SYNTHROID ) tablet 175 mcg, 175 mcg, Oral, Q0600, Mansy, Jan A, MD, 175 mcg at 05/09/24 0609   linaclotide  (LINZESS ) capsule 145 mcg, 145 mcg, Oral, QAC breakfast, Mansy, Jan A, MD, 145 mcg at 05/09/24 0920   linagliptin  (TRADJENTA ) tablet 5 mg, 5 mg, Oral, Daily, Mansy, Jan A, MD, 5 mg at 05/09/24 0920   melatonin tablet 10 mg, 10 mg, Oral, QHS, Mansy, Jan A, MD, 10 mg at 05/08/24 2217   metoCLOPramide  (REGLAN ) tablet 5 mg, 5 mg, Oral, TID AC, Mansy, Jan  A, MD, 5 mg at 05/09/24 9079   metoprolol  succinate (TOPROL -XL) 24 hr tablet 25 mg, 25 mg, Oral, Daily, Mansy, Jan A, MD, 25 mg at 05/09/24 9077   ondansetron  (ZOFRAN ) tablet 4 mg, 4 mg, Oral, Q6H PRN, 4 mg at 05/03/24 1219 **OR** ondansetron  (ZOFRAN ) injection 4 mg, 4 mg, Intravenous, Q6H PRN, Mansy, Jan A, MD, 4 mg at 05/07/24 2246   oxyCODONE  (Oxy IR/ROXICODONE ) immediate release tablet 10 mg, 10 mg, Oral, Q8H PRN, Ayiku, Bernard, MD, 10 mg at 05/09/24 0343   polyethylene glycol (MIRALAX  / GLYCOLAX ) packet 17 g, 17 g, Oral, BID, Mansy, Jan A, MD, 17 g at 05/09/24 9076   traZODone  (DESYREL ) tablet 25 mg, 25 mg, Oral, QHS PRN, Von Bellis, MD   Vitamin D  (Ergocalciferol ) (DRISDOL ) 1.25 MG (50000 UNIT) capsule 50,000 Units, 50,000 Units, Oral, Q7 days, Jens Durand, MD, 50,000 Units at 05/04/24 [phone]  ALLERGIES   Patient has no known allergies.     REVIEW OF SYSTEMS    Review of Systems:  Gen:  Denies  fever, sweats, chills weigh loss  HEENT: Denies blurred vision, double vision, ear pain, eye pain, hearing loss, nose bleeds, sore throat Cardiac:  No dizziness, chest pain or heaviness, chest tightness,edema Resp:   reports dyspnea chronically  Gi: Denies swallowing difficulty, stomach pain, nausea or vomiting, diarrhea, constipation, bowel incontinence Gu:  Denies bladder incontinence, burning urine Ext:   Denies Joint pain, stiffness or swelling Skin: Denies  skin rash, easy bruising or bleeding or hives Endoc:  Denies polyuria, polydipsia , polyphagia or weight change Psych:   Denies depression, insomnia or hallucinations   Other:  All other systems negative   VS: BP 126/68 (BP Location: Right Arm)   Pulse (!) 40   Temp (!) 97.4 F (36.3 C) (Axillary)   Resp 17   Ht 5' (1.524 m)   Wt 136 kg   SpO2 95%   BMI 58.56 kg/m      PHYSICAL EXAM    GENERAL:NAD, no fevers, chills, no weakness no fatigue HEAD: Normocephalic, atraumatic.  EYES: Pupils equal, round,  reactive to light. Extraocular muscles intact. No scleral icterus.  MOUTH: Moist mucosal membrane. Dentition intact. No abscess noted.  EAR, NOSE, THROAT: Clear without exudates. No external lesions.  NECK: Supple. No thyromegaly. No nodules. No JVD.  PULMONARY: decreased breath sounds with mild rhonchi worse at bases bilaterally.  CARDIOVASCULAR: S1 and S2. Regular rate and rhythm. No murmurs, rubs, or gallops. No edema. Pedal pulses 2+ bilaterally.  GASTROINTESTINAL: Soft, nontender, nondistended. No masses. Positive bowel sounds. No hepatosplenomegaly.  MUSCULOSKELETAL: No swelling, clubbing, or edema. Range of motion full in all extremities.  NEUROLOGIC: Cranial nerves II through XII are intact. No gross focal neurological deficits. Sensation intact. Reflexes intact.  SKIN: No ulceration, lesions, rashes, or cyanosis. Skin warm and dry. Turgor intact.  PSYCHIATRIC: Mood, affect within normal limits. The patient is awake, alert and oriented x 3. Insight, judgment intact.       IMAGING   Narrative & Impression  CLINICAL DATA:  Acute onset dyspnea with associated facial swelling and associated wheezing. Multiple medical problems.   EXAM: CT CHEST WITHOUT CONTRAST   TECHNIQUE: Multidetector CT imaging of the chest was performed following the standard protocol without IV contrast.   RADIATION DOSE REDUCTION: This exam was performed according to the departmental dose-optimization program which includes automated exposure control, adjustment of the mA and/or kV according to patient size and/or use of iterative reconstruction technique.   COMPARISON:  Chest radiographs, most recent 05/04/2024. Chest CT, 04/21/2024   FINDINGS: Cardiovascular: Heart is normal in size configuration. Three-vessel coronary artery calcifications. No pericardial effusion. Mildly enlarged main pulmonary artery, 3.5 cm. Aorta normal in caliber. Mild aortic atherosclerotic calcifications.    Mediastinum/Nodes: No neck base, mediastinal or hilar masses. Shotty neck base mediastinal lymph nodes, all subcentimeter with the exception of a subcarinal node measuring 1.6 cm in short axis. These are stable from the prior CT. Trachea esophagus are unremarkable.   Lungs/Pleura: Small left and trace right loculated appearing posterior pleural effusions. There is associated rounded lower lobe left upper lobe lingula lung opacity consistent with atelectasis. Additional lung opacity, also consistent with atelectasis, projects along oblique fissures, greater on the left. There is bilateral interstitial thickening. These findings are stable from the prior CT.   No pneumothorax.   Upper Abdomen: No acute findings.   Musculoskeletal: Previous  fractures of T8-T9. Interbody fusion device extends from the lower endplate of T7 to the upper endplate of T10, well positioned and stable. There are well positioned and stable pedicle screws interconnecting rods spanning from T5 through T12. Mild old compression deformity of L1. No osteoblastic or osteolytic lesions.   There is diffuse subcutaneous soft tissue edema along the chest and visualized upper abdomen.   IMPRESSION: 1. No significant change from the recent prior chest CT. 2. Persistent small left and trace right loculated pleural effusions. Persistent rounded lung opacity in the lower lobes suspected be atelectasis, also noted the left upper lobe lingula and abutting the oblique fissures. 3. Persistent bilateral interstitial thickening suspected to be interstitial pulmonary edema. There is associated diffuse subcutaneous soft tissue edema.   Aortic Atherosclerosis (ICD10-I70.0).     Electronically Signed   By: Alm Parkins M.D.   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   Patient/Family are satisfied with care plan and all questions have been answered.    Provider disclosure: Patient with at least one acute or chronic illness or injury that poses a threat to life or bodily function and is being managed actively during this encounter.  All of the below services have been performed independently by signing provider:  review of prior documentation from internal and or external health records.  Review of previous and current lab results.  Interview and comprehensive assessment during patient visit today. Review of current and previous chest radiographs/CT scans. Discussion of management and test interpretation with health care team and patient/family.   This document was prepared using Dragon voice recognition software and may include unintentional dictation errors.     Leler Brion, M.D.  Division of Pulmonary & Critical Care Medicine

## 2024-05-09 NOTE — Progress Notes (Signed)
 Progress Note    Eugene [name]  FMW:[id number] DOB: Feb 25, 1961  DOA: 05/02/2024 PCP: [facility], [facility]      Brief Narrative:    Medical records reviewed and are as summarized below:  Eugene [name] is a 63 y.o. male with medical history significant for anxiety, depression, type 2 diabetes mellitus, dyslipidemia, hepatitis C, spinal stenosis, IBS, osteomyelitis, status post bilateral BKA, who presented to the emergency room with acute onset of dyspnea with associated facial and periorbital swelling with associated wheezing. He said he was told by the staff at the nursing home but his face was swollen and he was probably having an allergic reaction.  He is not aware of any recent change in diet or medication that will trigger any allergic reaction.  No fever or chills.  The patient was discharged from here on 7/22 after being managed for AKI, catheter associated UTI, hyperkalemia, ileus and acute hypoxic respiratory failure.    Initial vital signs in the ED temperature 99.1 F, respiratory rate 20, pulse 74, BP 122/82, O2 sat 93% on 6 L of oxygen.  Chest x-ray impression:  Overall, unchanged appearance of the bilateral pleural effusions and mid and lower lung airspace disease, likely atelectasis    He was given 1 dose of EpiPen , IV steroids, IV famotidine  and IV fluids for suspected anaphylaxis.   Assessment/Plan:   Principal Problem:   Anasarca associated with disorder of kidney Active Problems:   Anaphylaxis   Type 2 diabetes mellitus with peripheral neuropathy (HCC)   Essential hypertension   Hypothyroidism   IBS (irritable bowel syndrome)   Acute on chronic diastolic CHF (congestive heart failure) (HCC)   CKD (chronic kidney disease) stage 4, GFR 15-29 ml/min (HCC)    # Acute hypoxic respiratory failure:  Continue supplemental O2 inhalation and gradually wean off He may have underlying chronic hypoxic respiratory failure and require home O2 inhalation on  discharge   # COPD exacerbation. 8/12 Solu-Medrol  40 mg IV x 2 doses followed by prednisone  40 mg p.o. daily x 2 doses, 30 mg pod x 2 doses Started Brovana  nebulizer twice daily, DuoNeb every 6 hourly scheduled CT chest showed pleural effusion and atelectasis 8/12 s/p left thoracentesis 100 mL fluid was tapped.  No WBC/organism, culture negative.  High LDH, low protein, partially exudative.    AKI on CKD stage IV, with proteinuria Patient is volume overload secondary to CKD Continue IV Lasix  as per nephro Monitor urine output and renal functions daily Patient may need hemodialysis in near future   Fluid overload, most likely due to CKD IV acute on chronic diastolic CHF: S/p IV Lasix  x 1 dose on 05/03/2024.   BNP 172.1. 2D echo on 04/19/2024 showed EF estimated at 55 to 60%, indeterminate LV diastolic parameters, left and right atrial were not assessed, technically difficult study. 8/13 increased Lasix  80 mg IV twice daily   Hyperkalemia secondary to renal failure 8/13 Lokelma  given.  Hyperkalemia resolved Monitor potassium level daily   Hypoalbuminemia likely contributing to fluid overload.  Albumin  1.9. Probable AKI on CKD stage IV versus progressive CKD stage IV: Creatinine slowly trending upward.   Consulted Dr. Marcelino, nephrologist, to assist with management.  Suspected anaphylactic reaction: S/p EpiPen  on 05/02/2024, IV Pepcid  and IV Solu-Medrol .  Difficult to rule in or exclude anaphylactic reaction at this time. Facial swelling was probably due to fluid overload.   Type II DM with hyperglycemia: IV steroids likely contributed to severe hyperglycemia. Hemoglobin A1c is 6.0 on 04/14/2024.  8/14 increased Semglee  18 units twice daily and NovoLog  6 units 3 times daily with meals Hyperglycemia due to steroids   Comorbidities Peripheral neuropathy, hypertension, hypothyroidism, IBS, bilateral BKA  Recent discharge from the hospital on 05/01/2024 after hospitalization for AKI,  catheter associated UTI, suspected pneumonia, acute hypoxic respiratory failure, gastroparesis, ileus, chronic opioid use   Abdominal discomfort, could be gastroparesis versus ileus CT abdomen pelvis negative for any obstruction  # Vitamin D  deficiency: started vitamin D  50,000 units p.o. weekly, follow with PCP to repeat vitamin D  level after 3 to 6 months.  # Vitamin B12 level 240, at lower end, goal >400,  started vitamin B12 supplement to prevent deficiency.  Repeat B12 level after 3 to 6 months.  # Folic acid  level 6.8, at lower end, started folic acid  1 mg p.o. daily to prevent deficiency.  Repeat folic acid  level after 3 to 6 months.   Diet Order             Diet Carb Modified Fluid consistency: Thin; Room service appropriate? Yes; Fluid restriction: 1500 mL Fluid  Diet effective now                    Consultants: Nephrologist Pulmonologist  Procedures: 8/12 s/p left thoracentesis    Medications:    arformoterol   15 mcg Nebulization BID   bisacodyl   10 mg Oral QHS   Chlorhexidine  Gluconate Cloth  6 each Topical Daily   vitamin B-12  1,000 mcg Oral Daily   diphenhydrAMINE   25 mg Oral Q6H   famotidine   10 mg Oral Daily   feeding supplement (GLUCERNA SHAKE)  237 mL Oral TID BM   folic acid   1 mg Oral Daily   furosemide   80 mg Intravenous Q12H   gabapentin   300 mg Oral QHS   heparin  injection (subcutaneous)  5,000 Units Subcutaneous Q8H   insulin  aspart  0-15 Units Subcutaneous TID WC   insulin  aspart  6 Units Subcutaneous TID WC   insulin  glargine-yfgn  18 Units Subcutaneous BID   ipratropium-albuterol   3 mL Nebulization TID   levothyroxine   175 mcg Oral Q0600   linaclotide   145 mcg Oral QAC breakfast   linagliptin   5 mg Oral Daily   melatonin  10 mg Oral QHS   metoCLOPramide   5 mg Oral TID AC   metoprolol  succinate  25 mg Oral Daily   polyethylene glycol  17 g Oral BID   Vitamin D  (Ergocalciferol )  50,000 Units Oral Q7 days   Continuous Infusions:   albumin  human 12.5 g (05/09/24 0935)      Anti-infectives (From admission, onward)    None         Family Communication/Anticipated D/C date and plan/Code Status   DVT prophylaxis: heparin  injection 5,000 Units Start: 05/03/24 2200     Code Status: Do not attempt resuscitation (DNR) PRE-ARREST INTERVENTIONS DESIRED  Family Communication: None Disposition Plan: Plan to discharge to SNF   Status is: Inpatient Remains inpatient appropriate because: AKI, acute respiratory failure     Subjective:   No significant events overnight.  Denied any worsening of symptoms.  Complaining of dry mouth, advised to use citrus, lime or orange juice sips to generate saliva, continue fluid restriction 1.5 L/day.    Objective:    Vitals:   05/09/24 0801 05/09/24 0946 05/09/24 1206 05/09/24 1532  BP: (!) 183/71 126/68 (!) 154/76 (!) 182/73  Pulse: (!) 40     Resp:  17    Temp: ROLLEN)  97.4 F (36.3 C)  98.4 F (36.9 C)   TempSrc: Axillary  Oral   SpO2: 93% 95% 94% 92%  Weight:      Height:       No data found.   Intake/Output Summary (Last 24 hours) at 05/09/2024 1537 Last data filed at 05/09/2024 1300 Gross per 24 hour  Intake 1200 ml  Output 2800 ml  Net -1600 ml   Filed Weights   05/03/24 2039 05/07/24 0600  Weight: (!) 137.6 kg 136 kg    Exam:   GEN: Mild to moderate distress due to SOB, appears hypervolemic, and tachypneic SKIN: Erythema of upper back EYES: No pallor or icterus ENT: MMM.  No tongue swelling, periorbital swelling, lip swelling no obvious facial swelling.   CV: RRR PULM: Equal air entry bilaterally, mild bibasilar crackles, no more wheezes resolved after steroids.   ABD: soft, ND, NT, +BS CNS: AAO x 3, non focal EXT: s/p BL BKA, edema of bilateral thighs and edema of arms more edema of left arm possibly due to dependent edema, advised to keep arms elevated GU: Suprapubic catheter intact    Data Reviewed:   I have personally reviewed  following labs and imaging studies:  Labs: Labs show the following:   Basic Metabolic Panel: Recent Labs  Lab 05/05/24 0456 05/06/24 0523 05/07/24 0653 05/08/24 0520 05/09/24 0518  NA 137 135 136 138 137  K 4.9 5.4* 4.9 5.1 4.8  CL 107 102 102 105 105  CO2 22 24 26 26 24   GLUCOSE 203* 385* 285* 240* 244*  BUN 64* 68* 72* 75* 75*  CREATININE 4.44* 4.35* 4.34* 4.39* 4.00*  CALCIUM  7.4* 7.3* 7.4* 7.5* 7.1*  MG  --  1.9 2.0 2.1 1.9  PHOS 5.2* 5.2* 5.3* 5.5* 4.6   GFR Estimated Creatinine Clearance: 23.2 mL/min (A) (by C-G formula based on SCr of 4 mg/dL (H)). Liver Function Tests: Recent Labs  Lab 05/02/24 1926 05/04/24 0532 05/05/24 0456 05/06/24 0523 05/07/24 0653 05/08/24 0520  AST 16  --   --  14* 19 16  ALT 14  --   --  13 15 17   ALKPHOS 48  --   --  41 41 48  BILITOT 0.4  --   --  0.3 0.3 0.7  PROT 5.6*  --   --  5.5* 5.3* 5.7*  ALBUMIN  1.8* 1.9* 1.9* 2.0* 2.0* 2.1*   Recent Labs  Lab 05/06/24 1817  LIPASE 25   No results for input(s): AMMONIA in the last 168 hours. Coagulation profile No results for input(s): INR, PROTIME in the last 168 hours.  CBC: Recent Labs  Lab 05/02/24 1926 05/03/24 1034 05/06/24 0523 05/07/24 0653 05/08/24 0520 05/09/24 0518  WBC 6.5 6.3 4.7 8.9 8.2 8.0  NEUTROABS 4.3  --   --   --   --   --   HGB 8.9* 9.3* 9.4* 9.3* 9.7* 9.2*  HCT 28.3* 28.9* 28.9* 29.5* 30.4* 29.1*  MCV 94.6 92.6 90.0 91.9 91.8 92.1  PLT 210 219 209 238 236 195   Cardiac Enzymes: No results for input(s): CKTOTAL, CKMB, CKMBINDEX, TROPONINI in the last 168 hours. BNP (last 3 results) No results for input(s): PROBNP in the last 8760 hours. CBG: Recent Labs  Lab 05/08/24 1155 05/08/24 1628 05/08/24 2000 05/09/24 0759 05/09/24 1116  GLUCAP 246* 259* 285* 180* 199*   D-Dimer: No results for input(s): DDIMER in the last 72 hours. Hgb A1c: No results for input(s): HGBA1C in the last  72 hours. Lipid Profile: No results for  input(s): CHOL, HDL, LDLCALC, TRIG, CHOLHDL, LDLDIRECT in the last 72 hours. Thyroid function studies: No results for input(s): TSH, T4TOTAL, T3FREE, THYROIDAB in the last 72 hours.  Invalid input(s): FREET3 Anemia work up: No results for input(s): VITAMINB12, FOLATE, FERRITIN, TIBC, IRON , RETICCTPCT in the last 72 hours.  Sepsis Labs: Recent Labs  Lab 05/06/24 0523 05/07/24 0653 05/08/24 0520 05/09/24 0518  WBC 4.7 8.9 8.2 8.0    Microbiology Recent Results (from the past 240 hours)  Resp panel by RT-PCR (RSV, Flu A&B, Covid) Anterior Nasal Swab     Status: None   Collection Time: 05/02/24  8:06 PM   Specimen: Anterior Nasal Swab  Result Value Ref Range Status   SARS Coronavirus 2 by RT PCR NEGATIVE NEGATIVE Final    Comment: (NOTE) SARS-CoV-2 target nucleic acids are NOT DETECTED.  The SARS-CoV-2 RNA is generally detectable in upper respiratory specimens during the acute phase of infection. The lowest concentration of SARS-CoV-2 viral copies this assay can detect is 138 copies/mL. A negative result does not preclude SARS-Cov-2 infection and should not be used as the sole basis for treatment or other patient management decisions. A negative result may occur with  improper specimen collection/handling, submission of specimen other than nasopharyngeal swab, presence of viral mutation(s) within the areas targeted by this assay, and inadequate number of viral copies(<138 copies/mL). A negative result must be combined with clinical observations, patient history, and epidemiological information. The expected result is Negative.  Fact Sheet for Patients:  BloggerCourse.com  Fact Sheet for Healthcare Providers:  SeriousBroker.it  This test is no t yet approved or cleared by the [place]  FDA and  has been authorized for detection and/or diagnosis of SARS-CoV-2 by FDA under an Emergency Use  Authorization (EUA). This EUA will remain  in effect (meaning this test can be used) for the duration of the COVID-19 declaration under Section 564(b)(1) of the Act, 21 U.S.C.section 360bbb-3(b)(1), unless the authorization is terminated  or revoked sooner.       Influenza A by PCR NEGATIVE NEGATIVE Final   Influenza B by PCR NEGATIVE NEGATIVE Final    Comment: (NOTE) The Xpert Xpress SARS-CoV-2/FLU/RSV plus assay is intended as an aid in the diagnosis of influenza from Nasopharyngeal swab specimens and should not be used as a sole basis for treatment. Nasal washings and aspirates are unacceptable for Xpert Xpress SARS-CoV-2/FLU/RSV testing.  Fact Sheet for Patients: BloggerCourse.com  Fact Sheet for Healthcare Providers: SeriousBroker.it  This test is not yet approved or cleared by the [place]  FDA and has been authorized for detection and/or diagnosis of SARS-CoV-2 by FDA under an Emergency Use Authorization (EUA). This EUA will remain in effect (meaning this test can be used) for the duration of the COVID-19 declaration under Section 564(b)(1) of the Act, 21 U.S.C. section 360bbb-3(b)(1), unless the authorization is terminated or revoked.     Resp Syncytial Virus by PCR NEGATIVE NEGATIVE Final    Comment: (NOTE) Fact Sheet for Patients: BloggerCourse.com  Fact Sheet for Healthcare Providers: SeriousBroker.it  This test is not yet approved or cleared by the [place]  FDA and has been authorized for detection and/or diagnosis of SARS-CoV-2 by FDA under an Emergency Use Authorization (EUA). This EUA will remain in effect (meaning this test can be used) for the duration of the COVID-19 declaration under Section 564(b)(1) of the Act, 21 U.S.C. section 360bbb-3(b)(1), unless the authorization is terminated or revoked.  Performed at [facility], [address] [place], [place] [address]   Body fluid culture w Gram Stain     Status: None   Collection Time: 05/05/24  4:10 PM   Specimen: PATH Cytology Pleural fluid  Result Value Ref Range Status   Specimen Description   Final    FLUID Performed at [facility], [address], [place], [place] [address]    Special Requests   Final    CYTO PLEU Performed at [facility], [address] [place], [place] [address]    Gram Stain NO WBC SEEN NO ORGANISMS SEEN CYTOSPIN SMEAR   Final   Culture   Final    NO GROWTH 3 DAYS Performed at [facility], [address] [place], [place] [address]    Report Status 05/09/2024 FINAL  Final    Procedures and diagnostic studies:  DG Chest Port 1 View Result Date: 05/07/2024 CLINICAL DATA:  Atelectasis EXAM: PORTABLE CHEST 1 VIEW COMPARISON:  Chest x-ray 05/05/2024 FINDINGS: Small pleural effusions and bibasilar opacities have not significantly changed. The heart is enlarged, unchanged. There is no pneumothorax or acute fracture. Thoracolumbar fusion hardware is again seen. IMPRESSION: Stable small pleural effusions and bibasilar atelectasis/airspace disease. Electronically Signed   By: Greig Pique M.D.   On: 05/07/2024 20:33     Total time spent: 40 minutes   LOS: 6 days   Chukwudi Ewen Von  [facility]   Pager on [email or web address]. If 7PM-7AM, please contact night-coverage at [email or web address]   05/09/2024, 3:37 PM

## 2024-05-09 NOTE — Progress Notes (Signed)
 [facility]  PROGRESS NOTE   Subjective:   Patient is comfortable today.  Patient is on IV Lasix  and albumin .  Urine output 2800 cc yesterday.  Objective:  Vital signs: Blood pressure (!) 146/83, pulse (!) 40, temperature 98.4 F (36.9 C), temperature source Oral, resp. rate 17, height 5' (1.524 m), weight 136 kg, SpO2 92%.  Intake/Output Summary (Last 24 hours) at 05/09/2024 1735 Last data filed at 05/09/2024 1543 Gross per 24 hour  Intake 1440 ml  Output 2800 ml  Net -1360 ml   Filed Weights   05/03/24 2039 05/07/24 0600  Weight: (!) 137.6 kg 136 kg     Physical Exam: General:  No acute distress  Head:  Normocephalic, atraumatic. Moist oral mucosal membranes  Eyes:  Anicteric  Neck:  Supple  Lungs:   Clear to auscultation, normal effort  Heart:  S1S2 no rubs  Abdomen:   Soft, nontender, bowel sounds present  Extremities: History of bilateral AKA.  Neurologic:  Awake, alert, following commands  Skin:  No lesions  Access:     Basic Metabolic Panel: Recent Labs  Lab 05/05/24 0456 05/06/24 0523 05/07/24 0653 05/08/24 0520 05/09/24 0518  NA 137 135 136 138 137  K 4.9 5.4* 4.9 5.1 4.8  CL 107 102 102 105 105  CO2 22 24 26 26 24   GLUCOSE 203* 385* 285* 240* 244*  BUN 64* 68* 72* 75* 75*  CREATININE 4.44* 4.35* 4.34* 4.39* 4.00*  CALCIUM  7.4* 7.3* 7.4* 7.5* 7.1*  MG  --  1.9 2.0 2.1 1.9  PHOS 5.2* 5.2* 5.3* 5.5* 4.6   GFR: Estimated Creatinine Clearance: 23.2 mL/min (A) (by C-G formula based on SCr of 4 mg/dL (H)).  Liver Function Tests: Recent Labs  Lab 05/02/24 1926 05/04/24 0532 05/05/24 0456 05/06/24 0523 05/07/24 0653 05/08/24 0520  AST 16  --   --  14* 19 16  ALT 14  --   --  13 15 17   ALKPHOS 48  --   --  41 41 48  BILITOT 0.4  --   --  0.3 0.3 0.7  PROT 5.6*  --   --  5.5* 5.3* 5.7*  ALBUMIN  1.8* 1.9* 1.9* 2.0* 2.0* 2.1*   Recent Labs  Lab 05/06/24 1817  LIPASE 25   No results for input(s): AMMONIA in the last 168  hours.  CBC: Recent Labs  Lab 05/02/24 1926 05/03/24 1034 05/06/24 0523 05/07/24 0653 05/08/24 0520 05/09/24 0518  WBC 6.5 6.3 4.7 8.9 8.2 8.0  NEUTROABS 4.3  --   --   --   --   --   HGB 8.9* 9.3* 9.4* 9.3* 9.7* 9.2*  HCT 28.3* 28.9* 28.9* 29.5* 30.4* 29.1*  MCV 94.6 92.6 90.0 91.9 91.8 92.1  PLT 210 219 209 238 236 195     HbA1C: Hgb A1c MFr Bld  Date/Time Value Ref Range Status  04/14/2024 06:43 AM 6.0 (H) 4.8 - 5.6 % Final    Comment:    (NOTE) Diagnosis of Diabetes The following HbA1c ranges recommended by the American Diabetes Association (ADA) may be used as an aid in the diagnosis of diabetes mellitus.  Hemoglobin             Suggested A1C NGSP%              Diagnosis  <5.7                   Non Diabetic  5.7-6.4  Pre-Diabetic  >6.4                   Diabetic  <7.0                   Glycemic control for                       adults with diabetes.    04/17/2021 07:29 AM 7.9 (H) 4.8 - 5.6 % Final    Comment:    (NOTE)         Prediabetes: 5.7 - 6.4         Diabetes: >6.4         Glycemic control for adults with diabetes: <7.0     Urinalysis: No results for input(s): COLORURINE, LABSPEC, PHURINE, GLUCOSEU, HGBUR, BILIRUBINUR, KETONESUR, PROTEINUR, UROBILINOGEN, NITRITE, LEUKOCYTESUR in the last 72 hours.  Invalid input(s): APPERANCEUR    Imaging: No results found.   Medications:    albumin  human 12.5 g (05/09/24 0935)    arformoterol   15 mcg Nebulization BID   bisacodyl   10 mg Oral QHS   Chlorhexidine  Gluconate Cloth  6 each Topical Daily   vitamin B-12  1,000 mcg Oral Daily   diphenhydrAMINE   25 mg Oral Q6H   famotidine   10 mg Oral Daily   feeding supplement (GLUCERNA SHAKE)  237 mL Oral TID BM   folic acid   1 mg Oral Daily   furosemide   80 mg Intravenous Q12H   gabapentin   300 mg Oral QHS   heparin  injection (subcutaneous)  5,000 Units Subcutaneous Q8H   insulin  aspart  0-15 Units Subcutaneous  TID WC   insulin  aspart  6 Units Subcutaneous TID WC   insulin  glargine-yfgn  18 Units Subcutaneous BID   ipratropium-albuterol   3 mL Nebulization TID   levothyroxine   175 mcg Oral Q0600   linaclotide   145 mcg Oral QAC breakfast   linagliptin   5 mg Oral Daily   melatonin  10 mg Oral QHS   metoCLOPramide   5 mg Oral TID AC   metoprolol  succinate  25 mg Oral Daily   polyethylene glycol  17 g Oral BID   Vitamin D  (Ergocalciferol )  50,000 Units Oral Q7 days    Assessment/ Plan:     63 year old male with history of diabetes, peripheral vascular disease, status post bilateral BKA, spinal stenosis, history of osteomyelitis, hepatitis C and hypertension now admitted with history of shortness of breath, wheezing and found to have angioedema.  He also has acute kidney injury on chronic kidney disease.  #1: Acute kidney injury: Patient with acute kidney injury on chronic kidney disease.  He is not uremic at this time.  Will continue to monitor closely.  #2: Diabetes: Continue insulin  protocol.  #3: Hypertension/CHF: Continue metoprolol  and Lasix  IV 80 mg twice a day.  #4: Nephrotic syndrome: Nephrotic range proteinuria most likely secondary to diabetic kidney disease.  Will continue to monitor.  #5: Anemia: Anemia secondary to chronic kidney disease.  Patient needs erythropoietin and iron .  Labs and medications reviewed. Will continue to follow along with you.   LOS: 6 Pinkey Edman, MD [facility] 8/16/20255:35 PM

## 2024-05-09 NOTE — Plan of Care (Signed)
  Problem: Nutritional: Goal: Maintenance of adequate nutrition will improve Outcome: Progressing   Problem: Health Behavior/Discharge Planning: Goal: Ability to manage health-related needs will improve Outcome: Progressing   Problem: Clinical Measurements: Goal: Will remain free from infection Outcome: Progressing   Problem: Coping: Goal: Level of anxiety will decrease Outcome: Progressing   Problem: Elimination: Goal: Will not experience complications related to urinary retention Outcome: Progressing

## 2024-05-10 DIAGNOSIS — N049 Nephrotic syndrome with unspecified morphologic changes: Secondary | ICD-10-CM | POA: Diagnosis not present

## 2024-05-10 LAB — BASIC METABOLIC PANEL WITH GFR
Anion gap: 9 (ref 5–15)
BUN: 79 mg/dL — ABNORMAL HIGH (ref 8–23)
CO2: 23 mmol/L (ref 22–32)
Calcium: 7.2 mg/dL — ABNORMAL LOW (ref 8.9–10.3)
Chloride: 104 mmol/L (ref 98–111)
Creatinine, Ser: 3.94 mg/dL — ABNORMAL HIGH (ref 0.61–1.24)
GFR, Estimated: 17 mL/min — ABNORMAL LOW (ref >60)
Glucose, Bld: 257 mg/dL — ABNORMAL HIGH (ref 70–99)
Potassium: 5.3 mmol/L — ABNORMAL HIGH (ref 3.5–5.1)
Sodium: 136 mmol/L (ref 135–145)

## 2024-05-10 LAB — CBC
HCT: 30.5 % — ABNORMAL LOW (ref 39.0–52.0)
Hemoglobin: 9.7 g/dL — ABNORMAL LOW (ref 13.0–17.0)
MCH: 29 pg (ref 26.0–34.0)
MCHC: 31.8 g/dL (ref 30.0–36.0)
MCV: 91.3 fL (ref 80.0–100.0)
Platelets: 201 K/uL (ref 150–400)
RBC: 3.34 MIL/uL — ABNORMAL LOW (ref 4.22–5.81)
RDW: 12.6 % (ref 11.5–15.5)
WBC: 8.4 K/uL (ref 4.0–10.5)
nRBC: 0 % (ref 0.0–0.2)

## 2024-05-10 LAB — GLUCOSE, CAPILLARY
Glucose-Capillary: 221 mg/dL — ABNORMAL HIGH (ref 70–99)
Glucose-Capillary: 299 mg/dL — ABNORMAL HIGH (ref 70–99)
Glucose-Capillary: 210 mg/dL — ABNORMAL HIGH (ref 70–99)
Glucose-Capillary: 245 mg/dL — ABNORMAL HIGH (ref 70–99)

## 2024-05-10 LAB — PHOSPHORUS: Phosphorus: 4.5 mg/dL (ref 2.5–4.6)

## 2024-05-10 LAB — MAGNESIUM: Magnesium: 2 mg/dL (ref 1.7–2.4)

## 2024-05-10 MED ORDER — SODIUM ZIRCONIUM CYCLOSILICATE 10 G PO PACK
10.0000 g | PACK | Freq: Three times a day (TID) | ORAL | Status: AC
  Administered 2024-05-10 (×2): 10 g via ORAL
  Filled 2024-05-10 (×2): qty 1

## 2024-05-10 NOTE — Progress Notes (Signed)
 [facility]  PROGRESS NOTE   Subjective:   Patient seen at bedside.  Denies any chest pain or shortness of breath.  Urine output was 2.8 L.  Objective:  Vital signs: Blood pressure (!) 145/71, pulse 71, temperature 98.1 F (36.7 C), temperature source Oral, resp. rate 12, height 5' (1.524 m), weight 136 kg, SpO2 96%.  Intake/Output Summary (Last 24 hours) at 05/10/2024 1238 Last data filed at 05/10/2024 0900 Gross per 24 hour  Intake 960 ml  Output 2850 ml  Net -1890 ml   Filed Weights   05/03/24 2039 05/07/24 0600  Weight: (!) 137.6 kg 136 kg     Physical Exam: General:  No acute distress  Head:  Normocephalic, atraumatic. Moist oral mucosal membranes  Eyes:  Anicteric  Neck:  Supple  Lungs:   Clear to auscultation, normal effort  Heart:  S1S2 no rubs  Abdomen:   Soft, nontender, bowel sounds present  Extremities:  peripheral edema.  Neurologic:  Awake, alert, following commands  Skin:  No lesions  Access:     Basic Metabolic Panel: Recent Labs  Lab 05/06/24 0523 05/07/24 0653 05/08/24 0520 05/09/24 0518 05/10/24 0536  NA 135 136 138 137 136  K 5.4* 4.9 5.1 4.8 5.3*  CL 102 102 105 105 104  CO2 24 26 26 24 23   GLUCOSE 385* 285* 240* 244* 257*  BUN 68* 72* 75* 75* 79*  CREATININE 4.35* 4.34* 4.39* 4.00* 3.94*  CALCIUM  7.3* 7.4* 7.5* 7.1* 7.2*  MG 1.9 2.0 2.1 1.9 2.0  PHOS 5.2* 5.3* 5.5* 4.6 4.5   GFR: Estimated Creatinine Clearance: 23.5 mL/min (A) (by C-G formula based on SCr of 3.94 mg/dL (H)).  Liver Function Tests: Recent Labs  Lab 05/04/24 0532 05/05/24 0456 05/06/24 0523 05/07/24 0653 05/08/24 0520  AST  --   --  14* 19 16  ALT  --   --  13 15 17   ALKPHOS  --   --  41 41 48  BILITOT  --   --  0.3 0.3 0.7  PROT  --   --  5.5* 5.3* 5.7*  ALBUMIN  1.9* 1.9* 2.0* 2.0* 2.1*   Recent Labs  Lab 05/06/24 1817  LIPASE 25   No results for input(s): AMMONIA in the last 168 hours.  CBC: Recent Labs  Lab 05/06/24 0523  05/07/24 0653 05/08/24 0520 05/09/24 0518 05/10/24 0536  WBC 4.7 8.9 8.2 8.0 8.4  HGB 9.4* 9.3* 9.7* 9.2* 9.7*  HCT 28.9* 29.5* 30.4* 29.1* 30.5*  MCV 90.0 91.9 91.8 92.1 91.3  PLT 209 238 236 195 201     HbA1C: Hgb A1c MFr Bld  Date/Time Value Ref Range Status  04/14/2024 06:43 AM 6.0 (H) 4.8 - 5.6 % Final    Comment:    (NOTE) Diagnosis of Diabetes The following HbA1c ranges recommended by the American Diabetes Association (ADA) may be used as an aid in the diagnosis of diabetes mellitus.  Hemoglobin             Suggested A1C NGSP%              Diagnosis  <5.7                   Non Diabetic  5.7-6.4                Pre-Diabetic  >6.4                   Diabetic  <7.0  Glycemic control for                       adults with diabetes.    04/17/2021 07:29 AM 7.9 (H) 4.8 - 5.6 % Final    Comment:    (NOTE)         Prediabetes: 5.7 - 6.4         Diabetes: >6.4         Glycemic control for adults with diabetes: <7.0     Urinalysis: No results for input(s): COLORURINE, LABSPEC, PHURINE, GLUCOSEU, HGBUR, BILIRUBINUR, KETONESUR, PROTEINUR, UROBILINOGEN, NITRITE, LEUKOCYTESUR in the last 72 hours.  Invalid input(s): APPERANCEUR    Imaging: No results found.   Medications:    albumin  human 12.5 g (05/10/24 1237)    arformoterol   15 mcg Nebulization BID   bisacodyl   10 mg Oral QHS   Chlorhexidine  Gluconate Cloth  6 each Topical Daily   vitamin B-12  1,000 mcg Oral Daily   diphenhydrAMINE   25 mg Oral Q6H   famotidine   10 mg Oral Daily   feeding supplement (GLUCERNA SHAKE)  237 mL Oral TID BM   folic acid   1 mg Oral Daily   furosemide   80 mg Intravenous Q12H   gabapentin   300 mg Oral QHS   heparin  injection (subcutaneous)  5,000 Units Subcutaneous Q8H   insulin  aspart  0-15 Units Subcutaneous TID WC   insulin  aspart  6 Units Subcutaneous TID WC   insulin  glargine-yfgn  18 Units Subcutaneous BID   ipratropium-albuterol    3 mL Nebulization BID   levothyroxine   175 mcg Oral Q0600   linaclotide   145 mcg Oral QAC breakfast   linagliptin   5 mg Oral Daily   melatonin  10 mg Oral QHS   metoCLOPramide   5 mg Oral TID AC   metoprolol  succinate  25 mg Oral Daily   polyethylene glycol  17 g Oral BID   sodium zirconium cyclosilicate   10 g Oral TID   Vitamin D  (Ergocalciferol )  50,000 Units Oral Q7 days    Assessment/ Plan:     63 year old male with history of diabetes, peripheral vascular disease, status post bilateral BKA, spinal stenosis, history of osteomyelitis, hepatitis C and hypertension now admitted with history of shortness of breath, wheezing and found to have angioedema.  He also has acute kidney injury on chronic kidney disease.   #1: Acute kidney injury: Patient with acute kidney injury on chronic kidney disease.  He is not uremic at this time.  Will continue to monitor closely.   #2: Diabetes: Continue insulin  protocol.   #3: Hypertension/CHF: Continue metoprolol  and Lasix  IV 80 mg twice a day.   #4: Nephrotic syndrome: Nephrotic range proteinuria most likely secondary to diabetic kidney disease.  Will continue to monitor.   #5: Anemia: Anemia secondary to chronic kidney disease.  Patient needs erythropoietin and iron .  #6: Hypokalemia: Continue Lokelma  10 g 3 times a day.   Labs and medications reviewed. Will continue to follow along with you.   LOS: 7 Pinkey Edman, MD [facility] 8/17/202512:38 PM

## 2024-05-10 NOTE — Progress Notes (Signed)
 PULMONOLOGY         Date: 05/10/2024,   MRN# [id number] Eugene [name] October 18, 1960     AdmissionWeight: (!) 137.6 kg                 CurrentWeight: 136 kg  Referring provider: Dr Von   CHIEF COMPLAINT:   Acute on chronic hypoxemic respiratory failure   HISTORY OF PRESENT ILLNESS   This is a 63 yo anxiety disorder, depression, HCV, dyslpidemia, IRS, osteomyelitis, end stage CKD, s/p bilateral amputations, who came in with facial swelling and progressive dyspnea with wheezing and labored breathing. He had remote hospitalization with hypoxemia and AKI.  In the ER he required 6L/min [place].  He had bloodwork showing severe renal failure with nephrotic range hypoalbuminemia and severe anemia. He received IV steroids and treatment for possible anaphylaxis on arrival.  He had CT chest with findings of lymphadenopathy including subcarinal node measuring 1.6 cm.  There is associated rounded lower lobe left upper lobe lingula lung opacity consistent with atelectasis.Additional lung opacity, also consistent with atelectasis, projects along oblique fissures, greater on the left. There is bilateral interstitial thickening.   05/10/24- patient states leg pain is slightly better, he has phantom limb pain.    Patient is on 4L/min.  He is slow to improve with CKD/atelectasis. His renal function is better today.  K is elevated   PAST MEDICAL HISTORY   Past Medical History:  Diagnosis Date   Anxiety    Depression    Diabetes mellitus without complication (HCC)    Hepatitis C    Hyperlipidemia    IBS (irritable bowel syndrome)    Osteomyelitis (HCC)    Spinal stenosis      SURGICAL HISTORY   Past Surgical History:  Procedure Laterality Date   BACK SURGERY     bilateral amputation Bilateral    CENTRAL LINE INSERTION-TUNNELED N/A 02/02/2019   Procedure: CENTRAL LINE INSERTION-TUNNELED;  Surgeon: Marea Selinda RAMAN, MD;  Location: [facility] INVASIVE CV LAB;  Service: Cardiovascular;  Laterality:  N/A;   COLONOSCOPY WITH PROPOFOL  N/A 01/12/2020   Procedure: COLONOSCOPY WITH PROPOFOL ;  Surgeon: Jinny Carmine, MD;  Location: [facility] ENDOSCOPY;  Service: Endoscopy;  Laterality: N/A;   ESOPHAGOGASTRODUODENOSCOPY (EGD) WITH PROPOFOL  N/A 12/07/2019   Procedure: ESOPHAGOGASTRODUODENOSCOPY (EGD) WITH PROPOFOL ;  Surgeon: Unk Corinn Skiff, MD;  Location: [facility] ENDOSCOPY;  Service: Gastroenterology;  Laterality: N/A;   IR CATHETER TUBE CHANGE  12/04/2019   SPINAL FUSION     THORACIC SPINE SURGERY  01/2019   extensive washout     FAMILY HISTORY   History reviewed. No pertinent family history.   SOCIAL HISTORY   Social History   Tobacco Use   Smoking status: Some Days    Current packs/day: 0.25    Types: Cigarettes   Smokeless tobacco: Never  Vaping Use   Vaping status: Never Used  Substance Use Topics   Alcohol use: Not Currently   Drug use: Not Currently     MEDICATIONS    Home Medication:    Current Medication:  Current Facility-Administered Medications:    acetaminophen  (TYLENOL ) tablet 650 mg, 650 mg, Oral, Q6H PRN, 650 mg at 05/07/24 2217 **OR** acetaminophen  (TYLENOL ) suppository 650 mg, 650 mg, Rectal, Q6H PRN, Mansy, Jan A, MD   albumin  human 25 % solution 12.5 g, 12.5 g, Intravenous, Daily, Tali Cleaves, MD, Last Rate: 60 mL/hr at 05/09/24 0935, 12.5 g at 05/09/24 0935   arformoterol  (BROVANA ) nebulizer solution 15 mcg, 15 mcg, Nebulization, BID,  Von Bellis, MD, 15 mcg at 05/10/24 9383   bisacodyl  (DULCOLAX) EC tablet 10 mg, 10 mg, Oral, QHS, Von Bellis, MD, 10 mg at 05/09/24 2016   bisacodyl  (DULCOLAX) suppository 10 mg, 10 mg, Rectal, Daily PRN, Von Bellis, MD, 10 mg at 05/06/24 1225   Chlorhexidine  Gluconate Cloth 2 % PADS 6 each, 6 each, Topical, Daily, Von Bellis, MD, 6 each at 05/08/24 0920   cyanocobalamin  (VITAMIN B12) tablet 1,000 mcg, 1,000 mcg, Oral, Daily, Von Bellis, MD, 1,000 mcg at 05/09/24 9077   diphenhydrAMINE  (BENADRYL ) capsule 25  mg, 25 mg, Oral, Q6H, Mansy, Jan A, MD, 25 mg at 05/10/24 9471   famotidine  (PEPCID ) tablet 10 mg, 10 mg, Oral, Daily, Chappell, Alex B, RPH, 10 mg at 05/09/24 9077   feeding supplement (GLUCERNA SHAKE) (GLUCERNA SHAKE) liquid 237 mL, 237 mL, Oral, TID BM, Von Bellis, MD, 237 mL at 05/09/24 1326   folic acid  (FOLVITE ) tablet 1 mg, 1 mg, Oral, Daily, Von Bellis, MD, 1 mg at 05/09/24 9078   furosemide  (LASIX ) injection 80 mg, 80 mg, Intravenous, Q12H, Von Bellis, MD, 80 mg at 05/09/24 2017   gabapentin  (NEURONTIN ) capsule 300 mg, 300 mg, Oral, QHS, Mansy, Jan A, MD, 300 mg at 05/09/24 2016   heparin  injection 5,000 Units, 5,000 Units, Subcutaneous, Q8H, Jens Durand, MD, 5,000 Units at 05/10/24 0529   hydrALAZINE  (APRESOLINE ) injection 10 mg, 10 mg, Intravenous, Q6H PRN, 10 mg at 05/09/24 1539 **OR** hydrALAZINE  (APRESOLINE ) tablet 50 mg, 50 mg, Oral, Q6H PRN, Von Bellis, MD   insulin  aspart (novoLOG ) injection 0-15 Units, 0-15 Units, Subcutaneous, TID WC, Ayiku, Bernard, MD, 5 Units at 05/09/24 1711   insulin  aspart (novoLOG ) injection 6 Units, 6 Units, Subcutaneous, TID WC, Von Bellis, MD, 6 Units at 05/09/24 1711   insulin  glargine-yfgn (SEMGLEE ) injection 18 Units, 18 Units, Subcutaneous, BID, Von Bellis, MD, 18 Units at 05/09/24 2017   ipratropium-albuterol  (DUONEB) 0.5-2.5 (3) MG/3ML nebulizer solution 3 mL, 3 mL, Nebulization, BID, Von Bellis, MD, 3 mL at 05/10/24 9383   levothyroxine  (SYNTHROID ) tablet 175 mcg, 175 mcg, Oral, Q0600, Mansy, Jan A, MD, 175 mcg at 05/10/24 9471   linaclotide  (LINZESS ) capsule 145 mcg, 145 mcg, Oral, QAC breakfast, Mansy, Jan A, MD, 145 mcg at 05/09/24 0920   linagliptin  (TRADJENTA ) tablet 5 mg, 5 mg, Oral, Daily, Mansy, Jan A, MD, 5 mg at 05/09/24 0920   melatonin tablet 10 mg, 10 mg, Oral, QHS, Mansy, Jan A, MD, 10 mg at 05/09/24 2016   metoCLOPramide  (REGLAN ) tablet 5 mg, 5 mg, Oral, TID AC, Mansy, Jan A, MD, 5 mg at 05/09/24 1711    metoprolol  succinate (TOPROL -XL) 24 hr tablet 25 mg, 25 mg, Oral, Daily, Mansy, Jan A, MD, 25 mg at 05/09/24 9077   ondansetron  (ZOFRAN ) tablet 4 mg, 4 mg, Oral, Q6H PRN, 4 mg at 05/03/24 1219 **OR** ondansetron  (ZOFRAN ) injection 4 mg, 4 mg, Intravenous, Q6H PRN, Mansy, Jan A, MD, 4 mg at 05/07/24 2246   oxyCODONE  (Oxy IR/ROXICODONE ) immediate release tablet 10 mg, 10 mg, Oral, Q8H PRN, Ayiku, Bernard, MD, 10 mg at 05/09/24 2026   polyethylene glycol (MIRALAX  / GLYCOLAX ) packet 17 g, 17 g, Oral, BID, Mansy, Jan A, MD, 17 g at 05/09/24 2016   traZODone  (DESYREL ) tablet 25 mg, 25 mg, Oral, QHS PRN, Von Bellis, MD   Vitamin D  (Ergocalciferol ) (DRISDOL ) 1.25 MG (50000 UNIT) capsule 50,000 Units, 50,000 Units, Oral, Q7 days, Jens Durand, MD, 50,000 Units at 05/04/24 [phone]  ALLERGIES   Patient has no known allergies.     REVIEW OF SYSTEMS    Review of Systems:  Gen:  Denies  fever, sweats, chills weigh loss  HEENT: Denies blurred vision, double vision, ear pain, eye pain, hearing loss, nose bleeds, sore throat Cardiac:  No dizziness, chest pain or heaviness, chest tightness,edema Resp:   reports dyspnea chronically  Gi: Denies swallowing difficulty, stomach pain, nausea or vomiting, diarrhea, constipation, bowel incontinence Gu:  Denies bladder incontinence, burning urine Ext:   Denies Joint pain, stiffness or swelling Skin: Denies  skin rash, easy bruising or bleeding or hives Endoc:  Denies polyuria, polydipsia , polyphagia or weight change Psych:   Denies depression, insomnia or hallucinations   Other:  All other systems negative   VS: BP 128/76 (BP Location: Right Arm)   Pulse 70   Temp 98 F (36.7 C) (Oral)   Resp 17   Ht 5' (1.524 m)   Wt 136 kg   SpO2 96%   BMI 58.56 kg/m      PHYSICAL EXAM    GENERAL:NAD, no fevers, chills, no weakness no fatigue HEAD: Normocephalic, atraumatic.  EYES: Pupils equal, round, reactive to light. Extraocular muscles intact.  No scleral icterus.  MOUTH: Moist mucosal membrane. Dentition intact. No abscess noted.  EAR, NOSE, THROAT: Clear without exudates. No external lesions.  NECK: Supple. No thyromegaly. No nodules. No JVD.  PULMONARY: decreased breath sounds with mild rhonchi worse at bases bilaterally.  CARDIOVASCULAR: S1 and S2. Regular rate and rhythm. No murmurs, rubs, or gallops. No edema. Pedal pulses 2+ bilaterally.  GASTROINTESTINAL: Soft, nontender, nondistended. No masses. Positive bowel sounds. No hepatosplenomegaly.  MUSCULOSKELETAL: No swelling, clubbing, or edema. Range of motion full in all extremities.  NEUROLOGIC: Cranial nerves II through XII are intact. No gross focal neurological deficits. Sensation intact. Reflexes intact.  SKIN: No ulceration, lesions, rashes, or cyanosis. Skin warm and dry. Turgor intact.  PSYCHIATRIC: Mood, affect within normal limits. The patient is awake, alert and oriented x 3. Insight, judgment intact.       IMAGING   Narrative & Impression  CLINICAL DATA:  Acute onset dyspnea with associated facial swelling and associated wheezing. Multiple medical problems.   EXAM: CT CHEST WITHOUT CONTRAST   TECHNIQUE: Multidetector CT imaging of the chest was performed following the standard protocol without IV contrast.   RADIATION DOSE REDUCTION: This exam was performed according to the departmental dose-optimization program which includes automated exposure control, adjustment of the mA and/or kV according to patient size and/or use of iterative reconstruction technique.   COMPARISON:  Chest radiographs, most recent 05/04/2024. Chest CT, 04/21/2024   FINDINGS: Cardiovascular: Heart is normal in size configuration. Three-vessel coronary artery calcifications. No pericardial effusion. Mildly enlarged main pulmonary artery, 3.5 cm. Aorta normal in caliber. Mild aortic atherosclerotic calcifications.   Mediastinum/Nodes: No neck base, mediastinal or hilar masses.  Shotty neck base mediastinal lymph nodes, all subcentimeter with the exception of a subcarinal node measuring 1.6 cm in short axis. These are stable from the prior CT. Trachea esophagus are unremarkable.   Lungs/Pleura: Small left and trace right loculated appearing posterior pleural effusions. There is associated rounded lower lobe left upper lobe lingula lung opacity consistent with atelectasis. Additional lung opacity, also consistent with atelectasis, projects along oblique fissures, greater on the left. There is bilateral interstitial thickening. These findings are stable from the prior CT.   No pneumothorax.   Upper Abdomen: No acute findings.   Musculoskeletal: Previous fractures of  T8-T9. Interbody fusion device extends from the lower endplate of T7 to the upper endplate of T10, well positioned and stable. There are well positioned and stable pedicle screws interconnecting rods spanning from T5 through T12. Mild old compression deformity of L1. No osteoblastic or osteolytic lesions.   There is diffuse subcutaneous soft tissue edema along the chest and visualized upper abdomen.   IMPRESSION: 1. No significant change from the recent prior chest CT. 2. Persistent small left and trace right loculated pleural effusions. Persistent rounded lung opacity in the lower lobes suspected be atelectasis, also noted the left upper lobe lingula and abutting the oblique fissures. 3. Persistent bilateral interstitial thickening suspected to be interstitial pulmonary edema. There is associated diffuse subcutaneous soft tissue edema.   Aortic Atherosclerosis (ICD10-I70.0).     Electronically Signed   By: Alm Parkins M.D.   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   Patient/Family are satisfied with care plan and all questions have been answered.    Provider disclosure: Patient with at least one acute or chronic illness or injury that poses a threat to life or bodily function and is being managed actively during this encounter.  All of the below services have been performed independently by signing provider:  review of prior documentation from internal and or external health records.  Review of previous and current lab results.  Interview and comprehensive assessment during patient visit today. Review of current and previous chest radiographs/CT scans. Discussion of management and test interpretation with health care team and patient/family.   This document was prepared using Dragon voice recognition software and may include unintentional dictation errors.     Travis Mastel, M.D.  Division of Pulmonary & Critical Care Medicine

## 2024-05-10 NOTE — Progress Notes (Addendum)
 Progress Note    Eugene [name]  FMW:[id number] DOB: 1961/08/23  DOA: 05/02/2024 PCP: [facility], [facility]      Brief Narrative:    Medical records reviewed and are as summarized below:  Eugene [name] is a 63 y.o. male with medical history significant for anxiety, depression, type 2 diabetes mellitus, dyslipidemia, hepatitis C, spinal stenosis, IBS, osteomyelitis, status post bilateral BKA, who presented to the emergency room with acute onset of dyspnea with associated facial and periorbital swelling with associated wheezing. He said he was told by the staff at the nursing home but his face was swollen and he was probably having an allergic reaction.  He is not aware of any recent change in diet or medication that will trigger any allergic reaction.  No fever or chills.  The patient was discharged from here on 7/22 after being managed for AKI, catheter associated UTI, hyperkalemia, ileus and acute hypoxic respiratory failure.    Initial vital signs in the ED temperature 99.1 F, respiratory rate 20, pulse 74, BP 122/82, O2 sat 93% on 6 L of oxygen.  Chest x-ray impression:  Overall, unchanged appearance of the bilateral pleural effusions and mid and lower lung airspace disease, likely atelectasis   He was given 1 dose of EpiPen , IV steroids, IV famotidine  and IV fluids for suspected anaphylaxis.   Assessment/Plan:   Principal Problem:   Anasarca associated with disorder of kidney Active Problems:   Anaphylaxis   Type 2 diabetes mellitus with peripheral neuropathy (HCC)   Essential hypertension   Hypothyroidism   IBS (irritable bowel syndrome)   Acute on chronic diastolic CHF (congestive heart failure) (HCC)   CKD (chronic kidney disease) stage 4, GFR 15-29 ml/min (HCC)    # Acute hypoxic respiratory failure:  Continue supplemental O2 inhalation and gradually wean off He may have underlying chronic hypoxic respiratory failure and require home O2 inhalation on  discharge 8/17 follow-up chest x-ray tomorrow a.m.  # COPD exacerbation. 8/12 Solu-Medrol  40 mg IV x 2 doses followed by prednisone  40 mg p.o. daily x 2 doses, 30 mg pod x 2 doses Started Brovana  nebulizer twice daily, DuoNeb every 6 hourly scheduled CT chest showed pleural effusion and atelectasis 8/12 s/p left thoracentesis 100 mL fluid was tapped.  No WBC/organism, culture negative.  High LDH, low protein, partially exudative.    AKI on CKD stage IV, with proteinuria Patient is volume overload secondary to CKD Continue IV Lasix  as per nephro Monitor urine output and renal functions daily Patient may need hemodialysis in near future   Fluid overload, most likely due to CKD IV acute on chronic diastolic CHF: S/p IV Lasix  x 1 dose on 05/03/2024.   BNP 172.1. 2D echo on 04/19/2024 showed EF estimated at 55 to 60%, indeterminate LV diastolic parameters, left and right atrial were not assessed, technically difficult study. 8/13 increased Lasix  80 mg IV twice daily   Hyperkalemia secondary to renal failure 8/13 Lokelma  given.  Hyperkalemia resolved Monitor potassium level daily   Hypoalbuminemia likely contributing to fluid overload.  Albumin  1.9. Probable AKI on CKD stage IV versus progressive CKD stage IV: Creatinine slowly trending upward.   Consulted Dr. Marcelino, nephrologist, to assist with management.  Suspected anaphylactic reaction: S/p EpiPen  on 05/02/2024, IV Pepcid  and IV Solu-Medrol .  Difficult to rule in or exclude anaphylactic reaction at this time. Facial swelling was probably due to fluid overload.   Type II DM with hyperglycemia: IV steroids likely contributed to severe hyperglycemia. Hemoglobin A1c  is 6.0 on 04/14/2024. 8/14 increased Semglee  18 units twice daily and NovoLog  6 units 3 times daily with meals Hyperglycemia due to steroids   Comorbidities Peripheral neuropathy, hypertension, hypothyroidism, IBS, bilateral BKA  Recent discharge from the hospital on  05/01/2024 after hospitalization for AKI, catheter associated UTI, suspected pneumonia, acute hypoxic respiratory failure, gastroparesis, ileus, chronic opioid use   Abdominal discomfort, could be gastroparesis versus ileus CT abdomen pelvis negative for any obstruction  # Vitamin D  deficiency: started vitamin D  50,000 units p.o. weekly, follow with PCP to repeat vitamin D  level after 3 to 6 months.  # Vitamin B12 level 240, at lower end, goal >400,  started vitamin B12 supplement to prevent deficiency.  Repeat B12 level after 3 to 6 months.  # Folic acid  level 6.8, at lower end, started folic acid  1 mg p.o. daily to prevent deficiency.  Repeat folic acid  level after 3 to 6 months.   Diet Order             Diet Carb Modified Fluid consistency: Thin; Room service appropriate? Yes; Fluid restriction: 1500 mL Fluid  Diet effective now                    Consultants: Nephrologist Pulmonologist  Procedures: 8/12 s/p left thoracentesis    Medications:    arformoterol   15 mcg Nebulization BID   bisacodyl   10 mg Oral QHS   Chlorhexidine  Gluconate Cloth  6 each Topical Daily   vitamin B-12  1,000 mcg Oral Daily   diphenhydrAMINE   25 mg Oral Q6H   famotidine   10 mg Oral Daily   feeding supplement (GLUCERNA SHAKE)  237 mL Oral TID BM   folic acid   1 mg Oral Daily   furosemide   80 mg Intravenous Q12H   gabapentin   300 mg Oral QHS   heparin  injection (subcutaneous)  5,000 Units Subcutaneous Q8H   insulin  aspart  0-15 Units Subcutaneous TID WC   insulin  aspart  6 Units Subcutaneous TID WC   insulin  glargine-yfgn  18 Units Subcutaneous BID   ipratropium-albuterol   3 mL Nebulization BID   levothyroxine   175 mcg Oral Q0600   linaclotide   145 mcg Oral QAC breakfast   linagliptin   5 mg Oral Daily   melatonin  10 mg Oral QHS   metoCLOPramide   5 mg Oral TID AC   metoprolol  succinate  25 mg Oral Daily   polyethylene glycol  17 g Oral BID   sodium zirconium cyclosilicate   10 g Oral  TID   Vitamin D  (Ergocalciferol )  50,000 Units Oral Q7 days   Continuous Infusions:  albumin  human Stopped (05/10/24 1329)      Anti-infectives (From admission, onward)    None         Family Communication/Anticipated D/C date and plan/Code Status   DVT prophylaxis: heparin  injection 5,000 Units Start: 05/03/24 2200     Code Status: Do not attempt resuscitation (DNR) PRE-ARREST INTERVENTIONS DESIRED  Family Communication: None Disposition Plan: Plan to discharge to SNF   Status is: Inpatient Remains inpatient appropriate because: AKI, acute respiratory failure     Subjective:   No significant events overnight.  Patient was laying comfortably, denies any worsening of shortness of breath, no chest pain or palpitation, no any other active issues.    Objective:    Vitals:   05/09/24 2338 05/10/24 0530 05/10/24 0747 05/10/24 1146  BP: (!) 157/90 (!) 154/110 128/76 (!) 145/71  Pulse: 72 66 70 71  Resp: 18  16 17 12   Temp: 98.1 F (36.7 C) 98.7 F (37.1 C) 98 F (36.7 C) 98.1 F (36.7 C)  TempSrc:  Oral Oral Oral  SpO2: 95% 97% 96% 96%  Weight:      Height:       No data found.   Intake/Output Summary (Last 24 hours) at 05/10/2024 1530 Last data filed at 05/10/2024 1300 Gross per 24 hour  Intake 960 ml  Output 2850 ml  Net -1890 ml   Filed Weights   05/03/24 2039 05/07/24 0600  Weight: (!) 137.6 kg 136 kg    Exam:   GEN: Mild to moderate distress due to SOB, appears hypervolemic, and tachypneic SKIN: Erythema of upper back EYES: No pallor or icterus ENT: MMM.  No tongue swelling, periorbital swelling, lip swelling no obvious facial swelling.   CV: RRR PULM: Equal air entry bilaterally, mild bibasilar crackles, no more wheezes resolved after steroids.   ABD: soft, ND, NT, +BS CNS: AAO x 3, non focal EXT: s/p BL BKA, edema of bilateral thighs and edema of arms more edema of left arm possibly due to dependent edema, advised to keep arms  elevated GU: Suprapubic catheter intact    Data Reviewed:   I have personally reviewed following labs and imaging studies:  Labs: Labs show the following:   Basic Metabolic Panel: Recent Labs  Lab 05/06/24 0523 05/07/24 0653 05/08/24 0520 05/09/24 0518 05/10/24 0536  NA 135 136 138 137 136  K 5.4* 4.9 5.1 4.8 5.3*  CL 102 102 105 105 104  CO2 24 26 26 24 23   GLUCOSE 385* 285* 240* 244* 257*  BUN 68* 72* 75* 75* 79*  CREATININE 4.35* 4.34* 4.39* 4.00* 3.94*  CALCIUM  7.3* 7.4* 7.5* 7.1* 7.2*  MG 1.9 2.0 2.1 1.9 2.0  PHOS 5.2* 5.3* 5.5* 4.6 4.5   GFR Estimated Creatinine Clearance: 23.5 mL/min (A) (by C-G formula based on SCr of 3.94 mg/dL (H)). Liver Function Tests: Recent Labs  Lab 05/04/24 0532 05/05/24 0456 05/06/24 0523 05/07/24 0653 05/08/24 0520  AST  --   --  14* 19 16  ALT  --   --  13 15 17   ALKPHOS  --   --  41 41 48  BILITOT  --   --  0.3 0.3 0.7  PROT  --   --  5.5* 5.3* 5.7*  ALBUMIN  1.9* 1.9* 2.0* 2.0* 2.1*   Recent Labs  Lab 05/06/24 1817  LIPASE 25   No results for input(s): AMMONIA in the last 168 hours. Coagulation profile No results for input(s): INR, PROTIME in the last 168 hours.  CBC: Recent Labs  Lab 05/06/24 0523 05/07/24 0653 05/08/24 0520 05/09/24 0518 05/10/24 0536  WBC 4.7 8.9 8.2 8.0 8.4  HGB 9.4* 9.3* 9.7* 9.2* 9.7*  HCT 28.9* 29.5* 30.4* 29.1* 30.5*  MCV 90.0 91.9 91.8 92.1 91.3  PLT 209 238 236 195 201   Cardiac Enzymes: No results for input(s): CKTOTAL, CKMB, CKMBINDEX, TROPONINI in the last 168 hours. BNP (last 3 results) No results for input(s): PROBNP in the last 8760 hours. CBG: Recent Labs  Lab 05/09/24 1116 05/09/24 1639 05/09/24 2019 05/10/24 0746 05/10/24 1142  GLUCAP 199* 242* 286* 221* 299*   D-Dimer: No results for input(s): DDIMER in the last 72 hours. Hgb A1c: No results for input(s): HGBA1C in the last 72 hours. Lipid Profile: No results for input(s): CHOL,  HDL, LDLCALC, TRIG, CHOLHDL, LDLDIRECT in the last 72 hours. Thyroid function studies: No  results for input(s): TSH, T4TOTAL, T3FREE, THYROIDAB in the last 72 hours.  Invalid input(s): FREET3 Anemia work up: No results for input(s): VITAMINB12, FOLATE, FERRITIN, TIBC, IRON , RETICCTPCT in the last 72 hours.  Sepsis Labs: Recent Labs  Lab 05/07/24 0653 05/08/24 0520 05/09/24 0518 05/10/24 0536  WBC 8.9 8.2 8.0 8.4    Microbiology Recent Results (from the past 240 hours)  Resp panel by RT-PCR (RSV, Flu A&B, Covid) Anterior Nasal Swab     Status: None   Collection Time: 05/02/24  8:06 PM   Specimen: Anterior Nasal Swab  Result Value Ref Range Status   SARS Coronavirus 2 by RT PCR NEGATIVE NEGATIVE Final    Comment: (NOTE) SARS-CoV-2 target nucleic acids are NOT DETECTED.  The SARS-CoV-2 RNA is generally detectable in upper respiratory specimens during the acute phase of infection. The lowest concentration of SARS-CoV-2 viral copies this assay can detect is 138 copies/mL. A negative result does not preclude SARS-Cov-2 infection and should not be used as the sole basis for treatment or other patient management decisions. A negative result may occur with  improper specimen collection/handling, submission of specimen other than nasopharyngeal swab, presence of viral mutation(s) within the areas targeted by this assay, and inadequate number of viral copies(<138 copies/mL). A negative result must be combined with clinical observations, patient history, and epidemiological information. The expected result is Negative.  Fact Sheet for Patients:  BloggerCourse.com  Fact Sheet for Healthcare Providers:  SeriousBroker.it  This test is no t yet approved or cleared by the [place]  FDA and  has been authorized for detection and/or diagnosis of SARS-CoV-2 by FDA under an Emergency Use Authorization  (EUA). This EUA will remain  in effect (meaning this test can be used) for the duration of the COVID-19 declaration under Section 564(b)(1) of the Act, 21 U.S.C.section 360bbb-3(b)(1), unless the authorization is terminated  or revoked sooner.       Influenza A by PCR NEGATIVE NEGATIVE Final   Influenza B by PCR NEGATIVE NEGATIVE Final    Comment: (NOTE) The Xpert Xpress SARS-CoV-2/FLU/RSV plus assay is intended as an aid in the diagnosis of influenza from Nasopharyngeal swab specimens and should not be used as a sole basis for treatment. Nasal washings and aspirates are unacceptable for Xpert Xpress SARS-CoV-2/FLU/RSV testing.  Fact Sheet for Patients: BloggerCourse.com  Fact Sheet for Healthcare Providers: SeriousBroker.it  This test is not yet approved or cleared by the [place]  FDA and has been authorized for detection and/or diagnosis of SARS-CoV-2 by FDA under an Emergency Use Authorization (EUA). This EUA will remain in effect (meaning this test can be used) for the duration of the COVID-19 declaration under Section 564(b)(1) of the Act, 21 U.S.C. section 360bbb-3(b)(1), unless the authorization is terminated or revoked.     Resp Syncytial Virus by PCR NEGATIVE NEGATIVE Final    Comment: (NOTE) Fact Sheet for Patients: BloggerCourse.com  Fact Sheet for Healthcare Providers: SeriousBroker.it  This test is not yet approved or cleared by the [place]  FDA and has been authorized for detection and/or diagnosis of SARS-CoV-2 by FDA under an Emergency Use Authorization (EUA). This EUA will remain in effect (meaning this test can be used) for the duration of the COVID-19 declaration under Section 564(b)(1) of the Act, 21 U.S.C. section 360bbb-3(b)(1), unless the authorization is terminated or revoked.  Performed at [facility], [address],  [place], [place] [address]   Body fluid culture w Gram Stain     Status: None  Collection Time: 05/05/24  4:10 PM   Specimen: PATH Cytology Pleural fluid  Result Value Ref Range Status   Specimen Description   Final    FLUID Performed at [facility], [address] [place], [place] [address]    Special Requests   Final    CYTO PLEU Performed at [facility], [address] [place], [place] [address]    Gram Stain NO WBC SEEN NO ORGANISMS SEEN CYTOSPIN SMEAR   Final   Culture   Final    NO GROWTH 3 DAYS Performed at [facility], [address] [place], [place] [address]    Report Status 05/09/2024 FINAL  Final    Procedures and diagnostic studies:  No results found.    Total time spent: 40 minutes   LOS: 7 days   Caretha Rumbaugh Von  [facility]   Pager on [email or web address]. If 7PM-7AM, please contact night-coverage at [email or web address]   05/10/2024, 3:30 PM

## 2024-05-10 NOTE — Plan of Care (Signed)
  Problem: Education: Goal: Ability to describe self-care measures that may prevent or decrease complications (Diabetes Survival Skills Education) will improve Outcome: Progressing Goal: Individualized Educational Video(s) Outcome: Progressing   Problem: Coping: Goal: Ability to adjust to condition or change in health will improve Outcome: Progressing   Problem: Fluid Volume: Goal: Ability to maintain a balanced intake and output will improve Outcome: Progressing   Problem: Metabolic: Goal: Ability to maintain appropriate glucose levels will improve Outcome: Progressing   Problem: Skin Integrity: Goal: Risk for impaired skin integrity will decrease Outcome: Progressing   Problem: Tissue Perfusion: Goal: Adequacy of tissue perfusion will improve Outcome: Progressing   Problem: Tissue Perfusion: Goal: Adequacy of tissue perfusion will improve Outcome: Progressing   Problem: Health Behavior/Discharge Planning: Goal: Ability to manage health-related needs will improve Outcome: Progressing   Problem: Clinical Measurements: Goal: Ability to maintain clinical measurements within normal limits will improve Outcome: Progressing Goal: Will remain free from infection Outcome: Progressing Goal: Diagnostic test results will improve Outcome: Progressing Goal: Respiratory complications will improve Outcome: Progressing Goal: Cardiovascular complication will be avoided Outcome: Progressing   Problem: Pain Managment: Goal: General experience of comfort will improve and/or be controlled Outcome: Progressing   Problem: Safety: Goal: Ability to remain free from injury will improve Outcome: Progressing   Problem: Skin Integrity: Goal: Risk for impaired skin integrity will decrease Outcome: Progressing

## 2024-05-11 ENCOUNTER — Inpatient Hospital Stay

## 2024-05-11 DIAGNOSIS — R6510 Systemic inflammatory response syndrome (SIRS) of non-infectious origin without acute organ dysfunction: Secondary | ICD-10-CM

## 2024-05-11 DIAGNOSIS — J90 Pleural effusion, not elsewhere classified: Secondary | ICD-10-CM

## 2024-05-11 DIAGNOSIS — I10 Essential (primary) hypertension: Secondary | ICD-10-CM

## 2024-05-11 DIAGNOSIS — R0602 Shortness of breath: Secondary | ICD-10-CM

## 2024-05-11 DIAGNOSIS — J9621 Acute and chronic respiratory failure with hypoxia: Secondary | ICD-10-CM | POA: Diagnosis not present

## 2024-05-11 DIAGNOSIS — K5900 Constipation, unspecified: Secondary | ICD-10-CM

## 2024-05-11 DIAGNOSIS — I5033 Acute on chronic diastolic (congestive) heart failure: Secondary | ICD-10-CM

## 2024-05-11 DIAGNOSIS — E119 Type 2 diabetes mellitus without complications: Secondary | ICD-10-CM

## 2024-05-11 DIAGNOSIS — N179 Acute kidney failure, unspecified: Secondary | ICD-10-CM | POA: Diagnosis not present

## 2024-05-11 DIAGNOSIS — N049 Nephrotic syndrome with unspecified morphologic changes: Secondary | ICD-10-CM | POA: Diagnosis not present

## 2024-05-11 DIAGNOSIS — E875 Hyperkalemia: Secondary | ICD-10-CM

## 2024-05-11 DIAGNOSIS — N184 Chronic kidney disease, stage 4 (severe): Secondary | ICD-10-CM

## 2024-05-11 LAB — BASIC METABOLIC PANEL WITH GFR
Anion gap: 10 (ref 5–15)
Anion gap: 10 (ref 5–15)
BUN: 77 mg/dL — ABNORMAL HIGH (ref 8–23)
BUN: 82 mg/dL — ABNORMAL HIGH (ref 8–23)
CO2: 24 mmol/L (ref 22–32)
CO2: 24 mmol/L (ref 22–32)
Calcium: 7.2 mg/dL — ABNORMAL LOW (ref 8.9–10.3)
Calcium: 7.4 mg/dL — ABNORMAL LOW (ref 8.9–10.3)
Chloride: 102 mmol/L (ref 98–111)
Chloride: 101 mmol/L (ref 98–111)
Creatinine, Ser: 3.91 mg/dL — ABNORMAL HIGH (ref 0.61–1.24)
Creatinine, Ser: 4.37 mg/dL — ABNORMAL HIGH (ref 0.61–1.24)
GFR, Estimated: 17 mL/min — ABNORMAL LOW (ref >60)
GFR, Estimated: 15 mL/min — ABNORMAL LOW (ref >60)
Glucose, Bld: 145 mg/dL — ABNORMAL HIGH (ref 70–99)
Glucose, Bld: 80 mg/dL (ref 70–99)
Potassium: 5.8 mmol/L — ABNORMAL HIGH (ref 3.5–5.1)
Potassium: 5.5 mmol/L — ABNORMAL HIGH (ref 3.5–5.1)
Sodium: 136 mmol/L (ref 135–145)
Sodium: 135 mmol/L (ref 135–145)

## 2024-05-11 LAB — URINALYSIS, COMPLETE (UACMP) WITH MICROSCOPIC
Bilirubin Urine: NEGATIVE
Glucose, UA: 50 mg/dL — AB
Ketones, ur: NEGATIVE mg/dL
Nitrite: NEGATIVE
Protein, ur: >=300 mg/dL — AB
RBC / HPF: >50 RBC/hpf (ref 0–5)
Specific Gravity, Urine: 1.018 (ref 1.005–1.030)
Squamous Epithelial / HPF: 0 /HPF (ref 0–5)
WBC, UA: >50 WBC/hpf (ref 0–5)
pH: 5 (ref 5.0–8.0)

## 2024-05-11 LAB — BRAIN NATRIURETIC PEPTIDE: B Natriuretic Peptide: 188.4 pg/mL — ABNORMAL HIGH (ref 0.0–100.0)

## 2024-05-11 LAB — CBC
HCT: 32.1 % — ABNORMAL LOW (ref 39.0–52.0)
HCT: 30.1 % — ABNORMAL LOW (ref 39.0–52.0)
Hemoglobin: 10.2 g/dL — ABNORMAL LOW (ref 13.0–17.0)
Hemoglobin: 9.4 g/dL — ABNORMAL LOW (ref 13.0–17.0)
MCH: 29.7 pg (ref 26.0–34.0)
MCH: 29.1 pg (ref 26.0–34.0)
MCHC: 31.8 g/dL (ref 30.0–36.0)
MCHC: 31.2 g/dL (ref 30.0–36.0)
MCV: 93.6 fL (ref 80.0–100.0)
MCV: 93.2 fL (ref 80.0–100.0)
Platelets: 158 K/uL (ref 150–400)
Platelets: 137 K/uL — ABNORMAL LOW (ref 150–400)
RBC: 3.43 MIL/uL — ABNORMAL LOW (ref 4.22–5.81)
RBC: 3.23 MIL/uL — ABNORMAL LOW (ref 4.22–5.81)
RDW: 12.6 % (ref 11.5–15.5)
RDW: 12.7 % (ref 11.5–15.5)
WBC: 7.4 K/uL (ref 4.0–10.5)
WBC: 6.6 K/uL (ref 4.0–10.5)
nRBC: 0 % (ref 0.0–0.2)
nRBC: 0 % (ref 0.0–0.2)

## 2024-05-11 LAB — GLUCOSE, CAPILLARY
Glucose-Capillary: 146 mg/dL — ABNORMAL HIGH (ref 70–99)
Glucose-Capillary: 113 mg/dL — ABNORMAL HIGH (ref 70–99)
Glucose-Capillary: 125 mg/dL — ABNORMAL HIGH (ref 70–99)
Glucose-Capillary: 84 mg/dL (ref 70–99)
Glucose-Capillary: 64 mg/dL — ABNORMAL LOW (ref 70–99)
Glucose-Capillary: 75 mg/dL (ref 70–99)

## 2024-05-11 LAB — MAGNESIUM
Magnesium: 1.7 mg/dL (ref 1.7–2.4)
Magnesium: 1.7 mg/dL (ref 1.7–2.4)

## 2024-05-11 LAB — PHOSPHORUS: Phosphorus: 3.9 mg/dL (ref 2.5–4.6)

## 2024-05-11 LAB — BLOOD GAS, VENOUS: Patient temperature: 37

## 2024-05-11 LAB — LACTIC ACID, PLASMA: Lactic Acid, Venous: 0.8 mmol/L (ref 0.5–1.9)

## 2024-05-11 MED ORDER — METOPROLOL TARTRATE 5 MG/5ML IV SOLN
5.0000 mg | Freq: Once | INTRAVENOUS | Status: AC
  Administered 2024-05-11: 5 mg via INTRAVENOUS
  Filled 2024-05-11: qty 5

## 2024-05-11 MED ORDER — BISACODYL 10 MG RE SUPP: 10.0000 mg | Freq: Every day | RECTAL | Status: DC

## 2024-05-11 MED ORDER — FUROSEMIDE 10 MG/ML IJ SOLN
80.0000 mg | Freq: Two times a day (BID) | INTRAMUSCULAR | Status: DC
  Administered 2024-05-12: 80 mg via INTRAVENOUS
  Filled 2024-05-11: qty 8

## 2024-05-11 MED ORDER — MINERAL OIL RE ENEM: 1.0000 | ENEMA | Freq: Once | RECTAL | Status: DC

## 2024-05-11 MED ORDER — SODIUM CHLORIDE 0.9 % IV SOLN
1.0000 g | INTRAVENOUS | Status: DC
  Filled 2024-05-11 (×2): qty 10

## 2024-05-11 MED ORDER — SODIUM ZIRCONIUM CYCLOSILICATE 5 G PO PACK
10.0000 g | PACK | Freq: Every day | ORAL | Status: DC
  Administered 2024-05-12: 10 g via ORAL
  Filled 2024-05-11: qty 2

## 2024-05-11 MED ORDER — DEXTROSE 50 % IV SOLN
1.0000 | Freq: Once | INTRAVENOUS | Status: AC
  Administered 2024-05-11: 50 mL via INTRAVENOUS
  Filled 2024-05-11: qty 50

## 2024-05-11 MED ORDER — METOPROLOL TARTRATE 5 MG/5ML IV SOLN
10.0000 mg | INTRAVENOUS | Status: DC | PRN
  Administered 2024-05-11: 10 mg via INTRAVENOUS
  Filled 2024-05-11: qty 10

## 2024-05-11 MED ORDER — ACETAMINOPHEN 325 MG PO TABS
325.0000 mg | ORAL_TABLET | Freq: Once | ORAL | Status: AC
  Administered 2024-05-11: 325 mg via ORAL
  Filled 2024-05-11: qty 1

## 2024-05-11 MED ORDER — PIPERACILLIN-TAZOBACTAM 3.375 G IVPB
3.3750 g | Freq: Three times a day (TID) | INTRAVENOUS | Status: DC
  Administered 2024-05-11 – 2024-05-13 (×5): 3.375 g via INTRAVENOUS
  Filled 2024-05-11 (×5): qty 50

## 2024-05-11 MED ORDER — INSULIN ASPART 100 UNIT/ML IV SOLN
5.0000 [IU] | Freq: Once | INTRAVENOUS | Status: AC
  Administered 2024-05-11: 5 [IU] via INTRAVENOUS
  Filled 2024-05-11: qty 0.05

## 2024-05-11 MED ORDER — SODIUM ZIRCONIUM CYCLOSILICATE 10 G PO PACK
10.0000 g | PACK | Freq: Three times a day (TID) | ORAL | Status: AC
  Administered 2024-05-11 (×2): 10 g via ORAL
  Filled 2024-05-11 (×2): qty 1

## 2024-05-11 NOTE — Progress Notes (Signed)
 PULMONOLOGY         Date: 05/11/2024,   MRN# [id number] Eugene [name] 1961-09-11     AdmissionWeight: (!) 137.6 kg                 CurrentWeight: 136 kg  Referring provider: Dr Von   CHIEF COMPLAINT:   Acute on chronic hypoxemic respiratory failure   HISTORY OF PRESENT ILLNESS   This is a 63 yo anxiety disorder, depression, HCV, dyslpidemia, IRS, osteomyelitis, end stage CKD, s/p bilateral amputations, who came in with facial swelling and progressive dyspnea with wheezing and labored breathing. He had remote hospitalization with hypoxemia and AKI.  In the ER he required 6L/min [place].  He had bloodwork showing severe renal failure with nephrotic range hypoalbuminemia and severe anemia. He received IV steroids and treatment for possible anaphylaxis on arrival.  He had CT chest with findings of lymphadenopathy including subcarinal node measuring 1.6 cm.  There is associated rounded lower lobe left upper lobe lingula lung opacity consistent with atelectasis.Additional lung opacity, also consistent with atelectasis, projects along oblique fissures, greater on the left. There is bilateral interstitial thickening.   05/11/24- patient is laying in bed in no distress.  He ate his breakfast but was angry that he did not receive his strawberry popsickle.  He is on 4L/min [place].  He appears less edematous.  I discussed his pulmonary therapy with respiratory therapist and learned he has been declining metaneb.  He needs to have CPAP long term and we can set that up on outpatient basis.  He will continue to develop atelectasis due to obesity, immobility and pulmonary congestion.   PAST MEDICAL HISTORY   Past Medical History:  Diagnosis Date   Anxiety    Depression    Diabetes mellitus without complication (HCC)    Hepatitis C    Hyperlipidemia    IBS (irritable bowel syndrome)    Osteomyelitis (HCC)    Spinal stenosis      SURGICAL HISTORY   Past Surgical History:  Procedure  Laterality Date   BACK SURGERY     bilateral amputation Bilateral    CENTRAL LINE INSERTION-TUNNELED N/A 02/02/2019   Procedure: CENTRAL LINE INSERTION-TUNNELED;  Surgeon: Marea Selinda RAMAN, MD;  Location: [facility] INVASIVE CV LAB;  Service: Cardiovascular;  Laterality: N/A;   COLONOSCOPY WITH PROPOFOL  N/A 01/12/2020   Procedure: COLONOSCOPY WITH PROPOFOL ;  Surgeon: Jinny Carmine, MD;  Location: [facility] ENDOSCOPY;  Service: Endoscopy;  Laterality: N/A;   ESOPHAGOGASTRODUODENOSCOPY (EGD) WITH PROPOFOL  N/A 12/07/2019   Procedure: ESOPHAGOGASTRODUODENOSCOPY (EGD) WITH PROPOFOL ;  Surgeon: Unk Corinn Skiff, MD;  Location: [facility] ENDOSCOPY;  Service: Gastroenterology;  Laterality: N/A;   IR CATHETER TUBE CHANGE  12/04/2019   SPINAL FUSION     THORACIC SPINE SURGERY  01/2019   extensive washout     FAMILY HISTORY   History reviewed. No pertinent family history.   SOCIAL HISTORY   Social History   Tobacco Use   Smoking status: Some Days    Current packs/day: 0.25    Types: Cigarettes   Smokeless tobacco: Never  Vaping Use   Vaping status: Never Used  Substance Use Topics   Alcohol use: Not Currently   Drug use: Not Currently     MEDICATIONS    Home Medication:    Current Medication:  Current Facility-Administered Medications:    acetaminophen  (TYLENOL ) tablet 650 mg, 650 mg, Oral, Q6H PRN, 650 mg at 05/11/24 0509 **OR** acetaminophen  (TYLENOL ) suppository 650 mg, 650 mg, Rectal, Q6H  PRN, Mansy, Jan A, MD   albumin  human 25 % solution 12.5 g, 12.5 g, Intravenous, Daily, Lucah Petta, MD, Stopped at 05/10/24 1329   arformoterol  (BROVANA ) nebulizer solution 15 mcg, 15 mcg, Nebulization, BID, Von Bellis, MD, 15 mcg at 05/11/24 0745   bisacodyl  (DULCOLAX) EC tablet 10 mg, 10 mg, Oral, QHS, Von Bellis, MD, 10 mg at 05/10/24 2120   bisacodyl  (DULCOLAX) suppository 10 mg, 10 mg, Rectal, Daily PRN, Von Bellis, MD, 10 mg at 05/06/24 1225   Chlorhexidine  Gluconate Cloth 2 % PADS 6 each, 6  each, Topical, Daily, Von Bellis, MD, 6 each at 05/10/24 1036   cyanocobalamin  (VITAMIN B12) tablet 1,000 mcg, 1,000 mcg, Oral, Daily, Von Bellis, MD, 1,000 mcg at 05/10/24 1036   diphenhydrAMINE  (BENADRYL ) capsule 25 mg, 25 mg, Oral, Q6H, Mansy, Jan A, MD, 25 mg at 05/11/24 9490   famotidine  (PEPCID ) tablet 10 mg, 10 mg, Oral, Daily, Chappell, Alex B, RPH, 10 mg at 05/10/24 1036   feeding supplement (GLUCERNA SHAKE) (GLUCERNA SHAKE) liquid 237 mL, 237 mL, Oral, TID BM, Von Bellis, MD, 237 mL at 05/10/24 1400   folic acid  (FOLVITE ) tablet 1 mg, 1 mg, Oral, Daily, Von Bellis, MD, 1 mg at 05/10/24 1036   furosemide  (LASIX ) injection 80 mg, 80 mg, Intravenous, Q12H, Von Bellis, MD, 80 mg at 05/10/24 2121   gabapentin  (NEURONTIN ) capsule 300 mg, 300 mg, Oral, QHS, Mansy, Jan A, MD, 300 mg at 05/10/24 2120   heparin  injection 5,000 Units, 5,000 Units, Subcutaneous, Q8H, Jens Durand, MD, 5,000 Units at 05/11/24 9490   hydrALAZINE  (APRESOLINE ) injection 10 mg, 10 mg, Intravenous, Q6H PRN, 10 mg at 05/09/24 1539 **OR** hydrALAZINE  (APRESOLINE ) tablet 50 mg, 50 mg, Oral, Q6H PRN, Von Bellis, MD   insulin  aspart (novoLOG ) injection 0-15 Units, 0-15 Units, Subcutaneous, TID WC, Ayiku, Bernard, MD, 5 Units at 05/10/24 1748   insulin  aspart (novoLOG ) injection 6 Units, 6 Units, Subcutaneous, TID WC, Von Bellis, MD, 6 Units at 05/10/24 1749   insulin  glargine-yfgn (SEMGLEE ) injection 18 Units, 18 Units, Subcutaneous, BID, Von Bellis, MD, 18 Units at 05/10/24 2120   ipratropium-albuterol  (DUONEB) 0.5-2.5 (3) MG/3ML nebulizer solution 3 mL, 3 mL, Nebulization, BID, Von Bellis, MD, 3 mL at 05/11/24 0745   levothyroxine  (SYNTHROID ) tablet 175 mcg, 175 mcg, Oral, Q0600, Mansy, Jan A, MD, 175 mcg at 05/11/24 9490   linaclotide  (LINZESS ) capsule 145 mcg, 145 mcg, Oral, QAC breakfast, Mansy, Jan A, MD, 145 mcg at 05/10/24 0900   linagliptin  (TRADJENTA ) tablet 5 mg, 5 mg, Oral, Daily, Mansy,  Jan A, MD, 5 mg at 05/10/24 1037   melatonin tablet 10 mg, 10 mg, Oral, QHS, Mansy, Jan A, MD, 10 mg at 05/10/24 2120   metoCLOPramide  (REGLAN ) tablet 5 mg, 5 mg, Oral, TID AC, Mansy, Jan A, MD, 5 mg at 05/10/24 1748   metoprolol  succinate (TOPROL -XL) 24 hr tablet 25 mg, 25 mg, Oral, Daily, Mansy, Jan A, MD, 25 mg at 05/10/24 1036   ondansetron  (ZOFRAN ) tablet 4 mg, 4 mg, Oral, Q6H PRN, 4 mg at 05/03/24 1219 **OR** ondansetron  (ZOFRAN ) injection 4 mg, 4 mg, Intravenous, Q6H PRN, Mansy, Jan A, MD, 4 mg at 05/07/24 2246   oxyCODONE  (Oxy IR/ROXICODONE ) immediate release tablet 10 mg, 10 mg, Oral, Q8H PRN, Ayiku, Bernard, MD, 10 mg at 05/10/24 2120   polyethylene glycol (MIRALAX  / GLYCOLAX ) packet 17 g, 17 g, Oral, BID, Mansy, Jan A, MD, 17 g at 05/10/24 1037   traZODone  (DESYREL ) tablet 25 mg,  25 mg, Oral, QHS PRN, Von Bellis, MD   Vitamin D  (Ergocalciferol ) (DRISDOL ) 1.25 MG (50000 UNIT) capsule 50,000 Units, 50,000 Units, Oral, Q7 days, Jens Durand, MD, 50,000 Units at 05/04/24 0820    ALLERGIES   Patient has no known allergies.     REVIEW OF SYSTEMS    Review of Systems:  Gen:  Denies  fever, sweats, chills weigh loss  HEENT: Denies blurred vision, double vision, ear pain, eye pain, hearing loss, nose bleeds, sore throat Cardiac:  No dizziness, chest pain or heaviness, chest tightness,edema Resp:   reports dyspnea chronically  Gi: Denies swallowing difficulty, stomach pain, nausea or vomiting, diarrhea, constipation, bowel incontinence Gu:  Denies bladder incontinence, burning urine Ext:   Denies Joint pain, stiffness or swelling Skin: Denies  skin rash, easy bruising or bleeding or hives Endoc:  Denies polyuria, polydipsia , polyphagia or weight change Psych:   Denies depression, insomnia or hallucinations   Other:  All other systems negative   VS: BP (!) 156/83 (BP Location: Right Arm)   Pulse (!) 47   Temp (!) 100.7 F (38.2 C)   Resp 19   Ht 5' (1.524 m)   Wt 136  kg   SpO2 93%   BMI 58.56 kg/m      PHYSICAL EXAM    GENERAL:NAD, no fevers, chills, no weakness no fatigue HEAD: Normocephalic, atraumatic.  EYES: Pupils equal, round, reactive to light. Extraocular muscles intact. No scleral icterus.  MOUTH: Moist mucosal membrane. Dentition intact. No abscess noted.  EAR, NOSE, THROAT: Clear without exudates. No external lesions.  NECK: Supple. No thyromegaly. No nodules. No JVD.  PULMONARY: decreased breath sounds with mild rhonchi worse at bases bilaterally.  CARDIOVASCULAR: S1 and S2. Regular rate and rhythm. No murmurs, rubs, or gallops. No edema. Pedal pulses 2+ bilaterally.  GASTROINTESTINAL: Soft, nontender, nondistended. No masses. Positive bowel sounds. No hepatosplenomegaly.  MUSCULOSKELETAL: No swelling, clubbing, or edema. Range of motion full in all extremities.  NEUROLOGIC: Cranial nerves II through XII are intact. No gross focal neurological deficits. Sensation intact. Reflexes intact.  SKIN: No ulceration, lesions, rashes, or cyanosis. Skin warm and dry. Turgor intact.  PSYCHIATRIC: Mood, affect within normal limits. The patient is awake, alert and oriented x 3. Insight, judgment intact.       IMAGING   Narrative & Impression  CLINICAL DATA:  Acute onset dyspnea with associated facial swelling and associated wheezing. Multiple medical problems.   EXAM: CT CHEST WITHOUT CONTRAST   TECHNIQUE: Multidetector CT imaging of the chest was performed following the standard protocol without IV contrast.   RADIATION DOSE REDUCTION: This exam was performed according to the departmental dose-optimization program which includes automated exposure control, adjustment of the mA and/or kV according to patient size and/or use of iterative reconstruction technique.   COMPARISON:  Chest radiographs, most recent 05/04/2024. Chest CT, 04/21/2024   FINDINGS: Cardiovascular: Heart is normal in size configuration. Three-vessel coronary  artery calcifications. No pericardial effusion. Mildly enlarged main pulmonary artery, 3.5 cm. Aorta normal in caliber. Mild aortic atherosclerotic calcifications.   Mediastinum/Nodes: No neck base, mediastinal or hilar masses. Shotty neck base mediastinal lymph nodes, all subcentimeter with the exception of a subcarinal node measuring 1.6 cm in short axis. These are stable from the prior CT. Trachea esophagus are unremarkable.   Lungs/Pleura: Small left and trace right loculated appearing posterior pleural effusions. There is associated rounded lower lobe left upper lobe lingula lung opacity consistent with atelectasis. Additional lung opacity, also consistent with  atelectasis, projects along oblique fissures, greater on the left. There is bilateral interstitial thickening. These findings are stable from the prior CT.   No pneumothorax.   Upper Abdomen: No acute findings.   Musculoskeletal: Previous fractures of T8-T9. Interbody fusion device extends from the lower endplate of T7 to the upper endplate of T10, well positioned and stable. There are well positioned and stable pedicle screws interconnecting rods spanning from T5 through T12. Mild old compression deformity of L1. No osteoblastic or osteolytic lesions.   There is diffuse subcutaneous soft tissue edema along the chest and visualized upper abdomen.   IMPRESSION: 1. No significant change from the recent prior chest CT. 2. Persistent small left and trace right loculated pleural effusions. Persistent rounded lung opacity in the lower lobes suspected be atelectasis, also noted the left upper lobe lingula and abutting the oblique fissures. 3. Persistent bilateral interstitial thickening suspected to be interstitial pulmonary edema. There is associated diffuse subcutaneous soft tissue edema.   Aortic Atherosclerosis (ICD10-I70.0).     Electronically Signed   By: Alm Parkins M.D.   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   Patient/Family are satisfied with care plan and all questions have been answered.    Provider disclosure: Patient with at least one acute or chronic illness or injury that poses a threat to life or bodily function and is being managed actively during this encounter.  All of the below services have been performed independently by signing provider:  review of prior documentation from internal and or external health records.  Review of previous and current lab results.  Interview and comprehensive assessment during patient visit today. Review of current and previous chest radiographs/CT scans. Discussion of management and test interpretation with health care team and patient/family.   This document was prepared using Dragon voice recognition software and may include unintentional dictation errors.     Fronnie Urton, M.D.  Division of Pulmonary & Critical Care Medicine

## 2024-05-11 NOTE — Progress Notes (Signed)
 Responded to consult to assess access site. Noted site documented as PICC line is a midline. Pt stated midline was placed about a month ago by a nursing facility. Midline noted to be pulled out and kinked under dressing. Midline was removed by primary nurse. New IV obtained as documented on flowsheet.

## 2024-05-11 NOTE — Plan of Care (Signed)
  Problem: Education: Goal: Ability to describe self-care measures that may prevent or decrease complications (Diabetes Survival Skills Education) will improve Outcome: Progressing Goal: Individualized Educational Video(s) Outcome: Progressing   Problem: Coping: Goal: Ability to adjust to condition or change in health will improve Outcome: Progressing   Problem: Fluid Volume: Goal: Ability to maintain a balanced intake and output will improve Outcome: Progressing   Problem: Metabolic: Goal: Ability to maintain appropriate glucose levels will improve Outcome: Progressing   Problem: Nutritional: Goal: Maintenance of adequate nutrition will improve Outcome: Progressing Goal: Progress toward achieving an optimal weight will improve Outcome: Progressing   Problem: Skin Integrity: Goal: Risk for impaired skin integrity will decrease Outcome: Progressing   Problem: Tissue Perfusion: Goal: Adequacy of tissue perfusion will improve Outcome: Progressing   Problem: Coping: Goal: Level of anxiety will decrease Outcome: Progressing   Problem: Activity: Goal: Risk for activity intolerance will decrease Outcome: Progressing   Problem: Pain Managment: Goal: General experience of comfort will improve and/or be controlled Outcome: Progressing   Problem: Safety: Goal: Ability to remain free from injury will improve Outcome: Progressing   Problem: Skin Integrity: Goal: Risk for impaired skin integrity will decrease Outcome: Progressing

## 2024-05-11 NOTE — Consult Note (Cosign Needed Addendum)
 NAME:  Eugene [name], MRN:  [id number], DOB:  1961-02-10, LOS: 8 ADMISSION DATE:  05/02/2024, CONSULTATION DATE:  05/11/24 REFERRING MD:  Dr. Von, CHIEF COMPLAINT:    History of Present Illness:  63 yo M presenting to [facility] ED on 05/02/24 for evaluation of shortness and facial swelling.  History provided per chart review and patient bedside report. Patient presents with complaints of shortness of breath and facial swelling. Patient reported feeling short of breath all day on 8/9, for unknown reason. Patient also reports chronic back pain and abdominal pain with correlating vomiting since 04/26/24. Denies rashes, paresthesias, no focal weakness, no fevers/chills, denied tongue swelling. Recent discharge 7/22 after treatment for constipation/ ileus/ gastroparesis, AKI & UTI as well as pneumonia.  EMS reported hypoxia with SpO2 in the 70's on room air, placed on 6 L [place] with improvement to 92%. They also administered 25 mg of Benadryl  for the facial swelling. ED course: Upon arrival patient alert and responsive with noticeable periorbital swelling. Patient was treated with epinephrine  IM, benadryl , Pepcid , albuterol  and steroids. Labs significant for mild AKI on CKD and imaging re-demonstrated known bilateral pleural effusions. He had interval improvement in facial swelling but still requiring increased O2 support of 5 L [place]. TRH consulted for admission. Initial Vitals: 99.1, 20, 74, 122/82 & 93% on 6 L [place]  Hospital Course: See significant events for details and timeline.  Most recent Significant labs: (Labs/ Imaging personally reviewed) I, Jenita Ruth Rust-Chester, AGACNP-BC, personally viewed and interpreted this ECG. EKG Interpretation: Date: 05/11/24, EKG Time: 19:01, Rate: 122, Rhythm: ST, QRS Axis:  normal, Intervals: normal, ST/T Wave abnormalities: non specific t wave inversions, Narrative Interpretation: ST Chemistry: Na+:135, K+: 5.5, BUN/Cr.: 82/4.37, Serum CO2/ AG: 24/10 Hematology: WBC:  pending, Hgb: pending,  Troponin: 18 > 18, BNP: 172.1, Lactic: 0.8,  VBG: 7.36/47/-/- CXR 05/11/24: left sided pleural effusion. Right basilar atelectasis. CT head wo contrast 05/11/24: no acute intracranial abnormality CT chest/abdomen/pelvis wo contrast 05/11/24: Interval development of debris within the right mainstem bronchi measuring up to 1.3 cm. Bilateral lower lobe consolidations with limited evaluation on this noncontrast study. Finding may represent infection/inflammation versus atelectasis. Re-demonstration of bilateral small, left greater than right, pleural effusions with pleural thickening. No acute intra-abdominal or intrapelvic abnormality. Limited evaluation due to noncontrast study.  Aortic Atherosclerosis   PCCM consulted for assistance in management and monitoring due to SIRS and possible sepsis.  Pertinent  Medical History  Depression & Anxiety Dyslipidemia Hepatitis C Spinal stenosis T2DM IBS Osteomyelitis Status post bilateral BKA Neurogenic bladder with chronic suprapubic catheter Bowel obstruction Recurrent fecal impaction  Significant Hospital Events: Including procedures, antibiotic start and stop dates in addition to other pertinent events   05/02/24: Admit to PCU by TRH for monitoring of suspected anaphylaxis 05/04/24: He complains of low oxygen saturation and feels a little short of breath. He said he was not aware that his face was swollen but the nursing staff at the nursing home reported that he had a swollen face and were concerned about an allergic reaction. Nephrology consulted. 05/05/24: Overnight patient had shortness of breath.  Chest x-ray was done, Lasix  IV one-time dose was given.  Patient was placed on BiPAP for as needed. In the morning time patient was still complaining that he cannot breathe, no chest pain.  Patient was having some abdominal discomfort. Management plan discussed with patient. Thoracentesis performed. 05/06/24: patient with severe  hyperglycemia today. 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 He is s/p solumedrol 40 BID and now on prednisone . His O2 req has improved from 7L/min to 5L/min. Pulmonology consulted. 05/07/24: patient has mild improvement in blood glucose measurements. His renal function is stable. His respiratory status is stable on 5l/min [place].  He is on prednisone , I have rduced dose to 30mg  po daily. He's on albumin  once daily.  Diagnostic left thoracentesis was performed showing no wbc/organissms noted. Today he is for repeat CXR and chest physiotherapy with recruitment maneuvers with metaneb TID.  05/08/24: patient still on 5L/min, RT states he is non compliant with metaneb. I have encouraged him to continue to work with us  to get his atelectatic lungs recruited and be able to wean oxygen. He is agreeable and shares he will try again. No changes in therapy from pulmonary today.  05/09/24:  patient reports severe leg pain which is chronic.  Patient is on 4L/min.  He is slow to improve with CKD/atelectasis  05/10/24: patient states leg pain is slightly better, he has phantom limb pain.  Patient is on 4L/min.  He is slow to improve with CKD/atelectasis. His renal function is better today.  K is elevated  05/11/24: Pulmonary therapy was discussed with respiratory therapist and learned he has been declining metaneb. He needs to have CPAP long term and we can set that up on outpatient basis. He will continue to develop atelectasis due to obesity, immobility and pulmonary congestion. PCCM consulted due to infection concerns and possible sepsis. Patient transferred to SDU for closer monitoring.  Interim History / Subjective:  Patient lethargic but responsive, able to follow persistent commands on 4 L [place]. Plan of care discussed, all questions answered at this time.  Objective    Blood pressure (!) 157/83, pulse 80,  temperature (!) 102.9 F (39.4 C), resp. rate 18, height 5' (1.524 m), weight 136 kg, SpO2 96%.    FiO2 (%):  [36 %] 36 %   Intake/Output Summary (Last 24 hours) at 05/11/2024 1935 Last data filed at 05/11/2024 1800 Gross per 24 hour  Intake 720 ml  Output 1300 ml  Net -580 ml   Filed Weights   05/03/24 2039 05/07/24 0600  Weight: (!) 137.6 kg 136 kg    Examination: General: Adult male, acutely ill, lying in bed, NAD HEENT: MM pink/moist, anicteric, atraumatic, neck supple Neuro: A&O x 3, able to follow persistent commands, PERRL +3, MAE CV: s1s2 RRR, ST on monitor, no r/m/g Pulm: Regular, non labored on 4 L [place] , breath sounds diminished throughout GI: soft, rounded, non tender, bs x 4 GU: suprapubic catheter in place with cloudy yellow urine Skin:  Excoriation noted Extremities: warm/dry, pulses + 2 R/P, no edema noted  Resolved problem list   Assessment and Plan  SIRS with concern for sepsis Patient with new fever, tachypnea and mild tachycardia. No leukocytosis on AM labs. Cultures sent and patient started on Zosyn  empirically. PCCM consulted. - f/u cultures, trend lactic (PCT unhelpful due to significant AKI) - STAT CBC, then daily, monitor WBC/ fever curve - IV antibiotics: zosyn  >> lactic WNL, if no leukocytosis consider de-escalation - conservative IVF hydration given anasarca and AKI  Acute on chronic Hypoxemic Respiratory Failure multifactorial in the setting of Bilateral Pleural Effusions & atelectasis - Supplemental O2 to maintain SpO2 > 90% - Intermittent chest x-ray & ABG PRN - Ensure adequate pulmonary hygiene  - F/u cultures, trend PCT - continue nebulizer regimen, chest PT &  bronchodilators PRN - f/u VBG - consider repeat thora vs chest tube placement  Acute Kidney Injury superimposed on CKD stage IV with proteinuria Nephrotic Syndrome Hyperkalemia Anasarca Baseline Cr: no recent labs, Cr on admission:4.23 Etiology unclear. AKI & UOP has improved  after D5 IVF resuscitation. Not a dialysis candidate at this time. - Strict I/O's: alert provider if UOP < 0.5 mL/kg/hr - STAT BMP, then daily, replace electrolytes PRN - consider additional shifting measures PRN > continue daily lokelma , insulin  & D50 ordered - Avoid nephrotoxic agents as able, ensure adequate renal perfusion - continue lasix  BID- held overnight dose due to fever - Nephrology following, appreciate input   Chronic Constipation & Fecal Impaction Large amount of stool at rectosigmoid colon - administer bisacodyl  suppository - continue bowel regimen: Miralax  BID, bisacodyl  PO - consider enema PRN  Type 2 Diabetes Mellitus Hemoglobin A1C: 6.0 - Monitor CBG AC, HS - SSI moderate dosing - continue Semglee  18 units - target range while in ICU: 140-180 - follow ICU hyper/hypo-glycemia protocol - Diabetes Coordinator following  Best Practice (right click and Reselect all SmartList Selections daily)  Diet/type: Regular consistency (see orders) DVT prophylaxis prophylactic heparin   Pressure ulcer(s): N/A GI prophylaxis: N/A Lines: N/A Foley:  Yes, and it is still needed Code Status:  DNR Last date of multidisciplinary goals of care discussion [per primary]  Labs   CBC: Recent Labs  Lab 05/07/24 0653 05/08/24 0520 05/09/24 0518 05/10/24 0536 05/11/24 0635  WBC 8.9 8.2 8.0 8.4 7.4  HGB 9.3* 9.7* 9.2* 9.7* 10.2*  HCT 29.5* 30.4* 29.1* 30.5* 32.1*  MCV 91.9 91.8 92.1 91.3 93.6  PLT 238 236 195 201 158    Basic Metabolic Panel: Recent Labs  Lab 05/07/24 0653 05/08/24 0520 05/09/24 0518 05/10/24 0536 05/11/24 0635  NA 136 138 137 136 136  K 4.9 5.1 4.8 5.3* 5.8*  CL 102 105 105 104 102  CO2 26 26 24 23 24   GLUCOSE 285* 240* 244* 257* 145*  BUN 72* 75* 75* 79* 77*  CREATININE 4.34* 4.39* 4.00* 3.94* 3.91*  CALCIUM  7.4* 7.5* 7.1* 7.2* 7.2*  MG 2.0 2.1 1.9 2.0 1.7  PHOS 5.3* 5.5* 4.6 4.5 3.9   GFR: Estimated Creatinine Clearance: 23.7 mL/min (A) (by  C-G formula based on SCr of 3.91 mg/dL (H)). Recent Labs  Lab 05/08/24 0520 05/09/24 0518 05/10/24 0536 05/11/24 0635  WBC 8.2 8.0 8.4 7.4    Liver Function Tests: Recent Labs  Lab 05/05/24 0456 05/06/24 0523 05/07/24 0653 05/08/24 0520  AST  --  14* 19 16  ALT  --  13 15 17   ALKPHOS  --  41 41 48  BILITOT  --  0.3 0.3 0.7  PROT  --  5.5* 5.3* 5.7*  ALBUMIN  1.9* 2.0* 2.0* 2.1*   Recent Labs  Lab 05/06/24 1817  LIPASE 25   No results for input(s): AMMONIA in the last 168 hours.  ABG    Component Value Date/Time   HCO3 21.7 04/19/2024 0606   ACIDBASEDEF 1.6 04/19/2024 0606     Coagulation Profile: No results for input(s): INR, PROTIME in the last 168 hours.  Cardiac Enzymes: No results for input(s): CKTOTAL, CKMB, CKMBINDEX, TROPONINI in the last 168 hours.  HbA1C: Hgb A1c MFr Bld  Date/Time Value Ref Range Status  04/14/2024 06:43 AM 6.0 (H) 4.8 - 5.6 % Final    Comment:    (NOTE) Diagnosis of Diabetes The following HbA1c ranges recommended by the American Diabetes Association (ADA) may  be used as an aid in the diagnosis of diabetes mellitus.  Hemoglobin             Suggested A1C NGSP%              Diagnosis  <5.7                   Non Diabetic  5.7-6.4                Pre-Diabetic  >6.4                   Diabetic  <7.0                   Glycemic control for                       adults with diabetes.    04/17/2021 07:29 AM 7.9 (H) 4.8 - 5.6 % Final    Comment:    (NOTE)         Prediabetes: 5.7 - 6.4         Diabetes: >6.4         Glycemic control for adults with diabetes: <7.0     CBG: Recent Labs  Lab 05/10/24 1629 05/10/24 2153 05/11/24 0905 05/11/24 1214 05/11/24 1634  GLUCAP 210* 245* 146* 113* 125*    Review of Systems: positives in BOLD  Gen: Denies fever, chills, weight change, fatigue, night sweats HEENT: Denies blurred vision, double vision, hearing loss, tinnitus, sinus congestion, rhinorrhea, sore  throat, neck stiffness, dysphagia PULM: Denies shortness of breath, cough, sputum production, hemoptysis, wheezing CV: Denies chest pain, edema, orthopnea, paroxysmal nocturnal dyspnea, palpitations GI: Denies abdominal pain, nausea, vomiting, diarrhea, hematochezia, melena, constipation, change in bowel habits GU: Denies dysuria, hematuria, polyuria, oliguria, urethral discharge Endocrine: Denies hot or cold intolerance, polyuria, polyphagia or appetite change Derm: Denies rash, dry skin, scaling or peeling skin change Heme: Denies easy bruising, bleeding, bleeding gums Neuro: Denies headache, numbness, weakness, slurred speech, loss of memory or consciousness  Past Medical History:  He,  has a past medical history of Anxiety, Depression, Diabetes mellitus without complication (HCC), Hepatitis C, Hyperlipidemia, IBS (irritable bowel syndrome), Osteomyelitis (HCC), and Spinal stenosis.   Surgical History:   Past Surgical History:  Procedure Laterality Date   BACK SURGERY     bilateral amputation Bilateral    CENTRAL LINE INSERTION-TUNNELED N/A 02/02/2019   Procedure: CENTRAL LINE INSERTION-TUNNELED;  Surgeon: Marea Selinda RAMAN, MD;  Location: [facility] INVASIVE CV LAB;  Service: Cardiovascular;  Laterality: N/A;   COLONOSCOPY WITH PROPOFOL  N/A 01/12/2020   Procedure: COLONOSCOPY WITH PROPOFOL ;  Surgeon: Jinny Carmine, MD;  Location: [facility] ENDOSCOPY;  Service: Endoscopy;  Laterality: N/A;   ESOPHAGOGASTRODUODENOSCOPY (EGD) WITH PROPOFOL  N/A 12/07/2019   Procedure: ESOPHAGOGASTRODUODENOSCOPY (EGD) WITH PROPOFOL ;  Surgeon: Unk Corinn Skiff, MD;  Location: [facility] ENDOSCOPY;  Service: Gastroenterology;  Laterality: N/A;   IR CATHETER TUBE CHANGE  12/04/2019   SPINAL FUSION     THORACIC SPINE SURGERY  01/2019   extensive washout     Social History:   reports that he has been smoking cigarettes. He has never used smokeless tobacco. He reports that he does not currently use alcohol. He reports that he does  not currently use drugs.   Family History:  His family history is not on file.   Allergies No Known Allergies   Home Medications  Prior to Admission medications   Medication Sig Start Date End  Date Taking? Authorizing Provider  acetaminophen  (TYLENOL ) 325 MG tablet Take 650 mg by mouth in the morning and at bedtime.   Yes [provider]  bisacodyl  (DULCOLAX) 10 MG suppository Place 10 mg rectally daily as needed for moderate constipation.   Yes [provider]  Doxepin  HCl 6 MG TABS Take 6 mg by mouth at bedtime.   Yes [provider]  furosemide  (LASIX ) 40 MG tablet Take 1 tablet (40 mg total) by mouth daily. 04/25/21  Yes Patel, Sona, MD  gabapentin  (NEURONTIN ) 300 MG capsule Take 300 mg by mouth at bedtime.   Yes [provider]  hydrocortisone  cream 1 % Apply 1 Application topically daily.   Yes [provider]  insulin  glargine-yfgn (SEMGLEE ) 100 UNIT/ML injection Inject 0.1 mLs (10 Units total) into the skin 2 (two) times daily. 05/01/24  Yes Sreenath, Sudheer B, MD  insulin  lispro (HUMALOG ) 100 UNIT/ML injection Inject 0-15 Units into the skin 4 (four) times daily -  before meals and at bedtime. <150= 0 units 151-200= 3 units 201-250= 6 units 251-300= 9 units 301-350= 12 units 351-400= 15 units >400 call MD Patient taking differently: Inject 0-15 Units into the skin 4 (four) times daily -  before meals and at bedtime. <150= 0 units 151-200= 3 units 201-250= 6 units 251-300= 9 units 301-350= 12 units 351-400= 15 units >400 call MD   Yes [provider]  lactulose  (CEPHULAC ) 20 g packet Take 20 g by mouth 2 (two) times daily.   Yes [provider]  levothyroxine  (SYNTHROID ) 175 MCG tablet Take 175 mcg by mouth at bedtime.   Yes [provider]  linaclotide  (LINZESS ) 145 MCG CAPS capsule Take 1 capsule (145 mcg total) by mouth daily before breakfast. 10/25/19  Yes Danford, Lonni SQUIBB, MD  Melatonin 5 MG TABS  Take 10 mg by mouth at bedtime.   Yes [provider]  metoCLOPramide  (REGLAN ) 10 MG tablet Take 0.5 tablets (5 mg total) by mouth 3 (three) times daily before meals. 05/01/24  Yes Sreenath, Sudheer B, MD  metoprolol  succinate (TOPROL -XL) 25 MG 24 hr tablet Take 25 mg by mouth daily.   Yes [provider]  ondansetron  (ZOFRAN ) 4 MG tablet Take 4 mg by mouth daily.   Yes [provider]  polyethylene glycol (MIRALAX  / GLYCOLAX ) 17 g packet Take 17 g by mouth 2 (two) times daily. 10/24/19  Yes Danford, Lonni SQUIBB, MD  senna-docusate (SENOKOT-S) 8.6-50 MG tablet Take 1 tablet by mouth 2 (two) times daily. 10/24/19  Yes Danford, Lonni SQUIBB, MD  simethicone  (MYLICON) 80 MG chewable tablet Chew 1 tablet (80 mg total) by mouth 4 (four) times daily. 05/01/24  Yes Sreenath, Sudheer B, MD  sitaGLIPtin (JANUVIA) 100 MG tablet Take 100 mg by mouth daily.   Yes [provider]  sodium phosphate  Pediatric (FLEET) 3.5-9.5 GM/59ML enema Place 1 enema rectally every other day as needed for severe constipation.   Yes [provider]  pregabalin  (LYRICA ) 75 MG capsule Take 75 mg by mouth 2 (two) times daily. Patient not taking: Reported on 04/17/2021  04/20/21  [provider]     Critical care time: 68 minutes        Jenita Jama Meek, AGACNP-BC Acute Care Nurse Practitioner [facility]   [phone] / [phone] Please see Amion for details.

## 2024-05-11 NOTE — Progress Notes (Signed)
 180/121 10mg  IV lopressor  given now. CCMD notified

## 2024-05-11 NOTE — Progress Notes (Signed)
 [facility]  ROUNDING NOTE   Subjective:   Eugene [name]  is a 63 y.o.  male  with past medical conditions including diabetes, bilateral BKA, suprapubic catheter, and hepatitis C. Patient [facility] ED from [facility] with facial edema and has been admitted for Wheezing [R06.2] Anaphylaxis [T78.2XXA] Facial swelling [R22.0] AKI (acute kidney injury) (HCC) [N17.9] Acute on chronic respiratory failure with hypoxia (HCC) [J96.21]  Patient is known to our practice from recent admission. He was released and pending follow up appt in our office at discharge.  Update  Patient seen sitting up in bed Alert and oriented Eating breakfast Requesting assistance sliding up in bed  Creatinine 3.91 UOP 3L  Objective:  Vital signs in last 24 hours:  Temp:  [97.4 F (36.3 C)-100.7 F (38.2 C)] 100.1 F (37.8 C) (08/18 1215) Pulse Rate:  [47-94] 94 (08/18 1215) Resp:  [15-20] 20 (08/18 1215) BP: (101-156)/(56-122) 150/100 (08/18 1215) SpO2:  [91 %-97 %] 93 % (08/18 1215) FiO2 (%):  [36 %] 36 % (08/17 1940)  Weight change:  Filed Weights   05/03/24 2039 05/07/24 0600  Weight: (!) 137.6 kg 136 kg    Intake/Output: I/O last 3 completed shifts: In: 720 [P.O.:720] Out: 5850 [Urine:5850]   Intake/Output this shift:  Total I/O In: 180 [P.O.:180] Out: -   Physical Exam: General: NAD  Head: Normocephalic  Eyes: Anicteric  Lungs:  Diminished, 4LNC  Heart: Regular rate   Abdomen:  Distended and firm, obese  Extremities: Trace peripheral edema.,  Bilateral BKA  Neurologic: Awake and alert  Skin: No rashes  Access: None  Suprapubic catheter in place  Basic Metabolic Panel: Recent Labs  Lab 05/07/24 0653 05/08/24 0520 05/09/24 0518 05/10/24 0536 05/11/24 0635  NA 136 138 137 136 136  K 4.9 5.1 4.8 5.3* 5.8*  CL 102 105 105 104 102  CO2 26 26 24 23 24   GLUCOSE 285* 240* 244* 257* 145*  BUN 72* 75* 75* 79* 77*  CREATININE 4.34* 4.39* 4.00* 3.94* 3.91*  CALCIUM   7.4* 7.5* 7.1* 7.2* 7.2*  MG 2.0 2.1 1.9 2.0 1.7  PHOS 5.3* 5.5* 4.6 4.5 3.9    Liver Function Tests: Recent Labs  Lab 05/05/24 0456 05/06/24 0523 05/07/24 0653 05/08/24 0520  AST  --  14* 19 16  ALT  --  13 15 17   ALKPHOS  --  41 41 48  BILITOT  --  0.3 0.3 0.7  PROT  --  5.5* 5.3* 5.7*  ALBUMIN  1.9* 2.0* 2.0* 2.1*   Recent Labs  Lab 05/06/24 1817  LIPASE 25   No results for input(s): AMMONIA in the last 168 hours.  CBC: Recent Labs  Lab 05/07/24 0653 05/08/24 0520 05/09/24 0518 05/10/24 0536 05/11/24 0635  WBC 8.9 8.2 8.0 8.4 7.4  HGB 9.3* 9.7* 9.2* 9.7* 10.2*  HCT 29.5* 30.4* 29.1* 30.5* 32.1*  MCV 91.9 91.8 92.1 91.3 93.6  PLT 238 236 195 201 158    Cardiac Enzymes: No results for input(s): CKTOTAL, CKMB, CKMBINDEX, TROPONINI in the last 168 hours.  BNP: Invalid input(s): POCBNP  CBG: Recent Labs  Lab 05/10/24 1142 05/10/24 1629 05/10/24 2153 05/11/24 0905 05/11/24 1214  GLUCAP 299* 210* 245* 146* 113*    Microbiology: Results for orders placed or performed during the hospital encounter of 05/02/24  Resp panel by RT-PCR (RSV, Flu A&B, Covid) Anterior Nasal Swab     Status: None   Collection Time: 05/02/24  8:06 PM   Specimen: Anterior Nasal  Swab  Result Value Ref Range Status   SARS Coronavirus 2 by RT PCR NEGATIVE NEGATIVE Final    Comment: (NOTE) SARS-CoV-2 target nucleic acids are NOT DETECTED.  The SARS-CoV-2 RNA is generally detectable in upper respiratory specimens during the acute phase of infection. The lowest concentration of SARS-CoV-2 viral copies this assay can detect is 138 copies/mL. A negative result does not preclude SARS-Cov-2 infection and should not be used as the sole basis for treatment or other patient management decisions. A negative result may occur with  improper specimen collection/handling, submission of specimen other than nasopharyngeal swab, presence of viral mutation(s) within the areas targeted  by this assay, and inadequate number of viral copies(<138 copies/mL). A negative result must be combined with clinical observations, patient history, and epidemiological information. The expected result is Negative.  Fact Sheet for Patients:  BloggerCourse.com  Fact Sheet for Healthcare Providers:  SeriousBroker.it  This test is no t yet approved or cleared by the [place]  FDA and  has been authorized for detection and/or diagnosis of SARS-CoV-2 by FDA under an Emergency Use Authorization (EUA). This EUA will remain  in effect (meaning this test can be used) for the duration of the COVID-19 declaration under Section 564(b)(1) of the Act, 21 U.S.C.section 360bbb-3(b)(1), unless the authorization is terminated  or revoked sooner.       Influenza A by PCR NEGATIVE NEGATIVE Final   Influenza B by PCR NEGATIVE NEGATIVE Final    Comment: (NOTE) The Xpert Xpress SARS-CoV-2/FLU/RSV plus assay is intended as an aid in the diagnosis of influenza from Nasopharyngeal swab specimens and should not be used as a sole basis for treatment. Nasal washings and aspirates are unacceptable for Xpert Xpress SARS-CoV-2/FLU/RSV testing.  Fact Sheet for Patients: BloggerCourse.com  Fact Sheet for Healthcare Providers: SeriousBroker.it  This test is not yet approved or cleared by the [place]  FDA and has been authorized for detection and/or diagnosis of SARS-CoV-2 by FDA under an Emergency Use Authorization (EUA). This EUA will remain in effect (meaning this test can be used) for the duration of the COVID-19 declaration under Section 564(b)(1) of the Act, 21 U.S.C. section 360bbb-3(b)(1), unless the authorization is terminated or revoked.     Resp Syncytial Virus by PCR NEGATIVE NEGATIVE Final    Comment: (NOTE) Fact Sheet for Patients: BloggerCourse.com  Fact  Sheet for Healthcare Providers: SeriousBroker.it  This test is not yet approved or cleared by the [place]  FDA and has been authorized for detection and/or diagnosis of SARS-CoV-2 by FDA under an Emergency Use Authorization (EUA). This EUA will remain in effect (meaning this test can be used) for the duration of the COVID-19 declaration under Section 564(b)(1) of the Act, 21 U.S.C. section 360bbb-3(b)(1), unless the authorization is terminated or revoked.  Performed at [facility], [address] [place], [place] [address]   Body fluid culture w Gram Stain     Status: None   Collection Time: 05/05/24  4:10 PM   Specimen: PATH Cytology Pleural fluid  Result Value Ref Range Status   Specimen Description   Final    FLUID Performed at [facility], [address] [place], [place] [address]    Special Requests   Final    CYTO PLEU Performed at [facility], [address] [place], [place] [address]    Gram Stain NO WBC SEEN NO ORGANISMS SEEN CYTOSPIN SMEAR   Final   Culture   Final    NO GROWTH 3 DAYS  Performed at [facility], [address] [place], [place] [address]    Report Status 05/09/2024 FINAL  Final    Coagulation Studies: No results for input(s): LABPROT, INR in the last 72 hours.  Urinalysis: No results for input(s): COLORURINE, LABSPEC, PHURINE, GLUCOSEU, HGBUR, BILIRUBINUR, KETONESUR, PROTEINUR, UROBILINOGEN, NITRITE, LEUKOCYTESUR in the last 72 hours.  Invalid input(s): APPERANCEUR    Imaging: DG Chest Port 1 View Result Date: 05/11/2024 CLINICAL DATA:  Pleural effusion EXAM: PORTABLE CHEST 1 VIEW COMPARISON:  05/08/2019 FINDINGS: Cardiac shadow is stable. Postsurgical change in the thoracic spine is noted. Left-sided pleural effusion is noted. No sizable right effusion is noted. Mild right basilar atelectasis is seen. IMPRESSION: Left-sided pleural effusion.  Right basilar atelectasis. Electronically Signed   By: Oneil Devonshire M.D.   On: 05/11/2024 10:38     Medications:       arformoterol   15 mcg Nebulization BID   bisacodyl   10 mg Oral QHS   Chlorhexidine  Gluconate Cloth  6 each Topical Daily   vitamin B-12  1,000 mcg Oral Daily   diphenhydrAMINE   25 mg Oral Q6H   famotidine   10 mg Oral Daily   feeding supplement (GLUCERNA SHAKE)  237 mL Oral TID BM   folic acid   1 mg Oral Daily   furosemide   80 mg Intravenous Q12H   gabapentin   300 mg Oral QHS   heparin  injection (subcutaneous)  5,000 Units Subcutaneous Q8H   insulin  aspart  0-15 Units Subcutaneous TID WC   insulin  aspart  6 Units Subcutaneous TID WC   insulin  glargine-yfgn  18 Units Subcutaneous BID   ipratropium-albuterol   3 mL Nebulization BID   levothyroxine   175 mcg Oral Q0600   linaclotide   145 mcg Oral QAC breakfast   linagliptin   5 mg Oral Daily   melatonin  10 mg Oral QHS   metoCLOPramide   5 mg Oral TID AC   metoprolol  succinate  25 mg Oral Daily   polyethylene glycol  17 g Oral BID   sodium zirconium cyclosilicate   10 g Oral TID   Vitamin D  (Ergocalciferol )  50,000 Units Oral Q7 days   acetaminophen  **OR** acetaminophen , bisacodyl , hydrALAZINE  **OR** hydrALAZINE , ondansetron  **OR** ondansetron  (ZOFRAN ) IV, oxyCODONE , traZODone   Assessment/ Plan:  Eugene [name] is a 63 y.o.  male  with past medical conditions including diabetes, bilateral BKA, suprapubic catheter, hepatitis C, who was admitted to [facility] 04/14/[facility]24 for Suprapubic catheter (HCC) [Z93.59] AKI (acute kidney injury) (HCC) [N17.9] Acute kidney injury (HCC) [N17.9]    Acute kidney injury with proteinuria chronic kidney disease stage IV, creatinine 3.74 at time of previous discharge.  Creatinine 4.19 on admission.  Etiology of AKI unclear at this time.  Concerning for progression of kidney.   Renal function has shown some improvements over the weekend. No acute need for dialysis. Will continue to monitor.     Lab Results  Component Value Date   CREATININE 3.91 (H) 05/11/2024   CREATININE 3.94 (H) 05/10/2024   CREATININE 4.00 (H) 05/09/2024     Intake/Output Summary (Last 24 hours) at 05/11/2024 1445 Last data filed at 05/11/2024 1012 Gross per 24 hour  Intake 420 ml  Output 3000 ml  Net -2580 ml       2.  Hypertension with chronic kidney disease.  Currently prescribed Hydralazine , furosemide  and metoprolol .  Blood pressure slightly elevated this morning.   3.  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 Urine protein to creatinine ratio of 15.4 via  Foley specimen.  Proteinuria likely secondary to diabetic nephropathy.  Hemoglobin A1c of 6.0% from 04/14/2024.  Previously diabetes control was suboptimal.  Glucose elevated at times.   4. Anemia of chronic kidney disease Lab Results  Component Value Date   HGB 10.2 (L) 05/11/2024    Hemoglobin remains stable   LOS: 8 Eugene [name] 8/18/20252:45 PM

## 2024-05-11 NOTE — Plan of Care (Signed)
 Rapid response was called later in the evening due to shortness of breath, tachycardia and hypertension.  As per RN patient's O2 sats dropped, tachypneic and does not look good. Vitals BP and heart rate were elevated, so IV Lopressor  5 mg and then 10 mg given.  Vital signs stabilized. Patient had low-grade fever earlier so UA, urine culture and blood culture were done.  Patient received a dose of ceftriaxone . Due to worsening of his condition now we will start Zosyn , pharmacy consulted for dosing. Repeat labs, BMP to check potassium level and magnesium  level. CT chest abdomen pelvis without contrast order placed Critical team consulted for evaluation and patient will be moved to stepdown unit for close monitoring. Critical care ICU team was at bedside and I placed all the orders.  As per critical care team they will eval the patient and take care of him. We will continue to follow the patient for comanagement.

## 2024-05-11 NOTE — Progress Notes (Signed)
 eLink Physician-Brief Progress Note Patient Name: Eugene [name] DOB: [birth year] MRN: [id number]   Date of Service  05/11/2024  HPI/Events of Note  63/M admitted on 8/9 for facial swelling with associated shortness of breath. Pt had received epinephrine , steroids. Swelling in part attributed to AKI on CKD, nephrotic syndrome. Pt appears to have been responding well to medical treatment.  Overnight, however, pt suddenly developed tachycardia, with associated tachypnea, desaturation, and low grade fever. He was transferred to the ICU for close monitoring and further management.  BP 105/47 HR 109 RR 16, SpO2 90-93% on nasal cannula I/O indicates pt net negative 15.4L since admission.   eICU Interventions  Pt started on empiric antibiotics for possible developing sepsis.  Will follow cultures, and adjust antibiotics as warranted.  CT imaging pending.  Not requiring vasopressor support at this time.  CPAP nightly and PRN Will continue to monitor I/Os closely        Charly Holcomb M DELA CRUZ 05/11/2024, 9:35 PM

## 2024-05-11 NOTE — Progress Notes (Signed)
 Progress Note    Eugene [name]  FMW:[id number] DOB: 1960-10-24  DOA: 05/02/2024 PCP: [facility]      Brief Narrative:    Medical records reviewed and are as summarized below:  Eugene [name] is a 63 y.o. male with medical history significant for anxiety, depression, type 2 diabetes mellitus, dyslipidemia, hepatitis C, spinal stenosis, IBS, osteomyelitis, status post bilateral BKA, who presented to the emergency room with acute onset of dyspnea with associated facial and periorbital swelling with associated wheezing. He said he was told by the staff at the nursing home but his face was swollen and he was probably having an allergic reaction.  He is not aware of any recent change in diet or medication that will trigger any allergic reaction.  No fever or chills.  The patient was discharged from here on 7/22 after being managed for AKI, catheter associated UTI, hyperkalemia, ileus and acute hypoxic respiratory failure.    Initial vital signs in the ED temperature 99.1 F, respiratory rate 20, pulse 74, BP 122/82, O2 sat 93% on 6 L of oxygen.  Chest x-ray impression:  Overall, unchanged appearance of the bilateral pleural effusions and mid and lower lung airspace disease, likely atelectasis   He was given 1 dose of EpiPen , IV steroids, IV famotidine  and IV fluids for suspected anaphylaxis.   Assessment/Plan:   Principal Problem:   Anasarca associated with disorder of kidney Active Problems:   Anaphylaxis   Type 2 diabetes mellitus with peripheral neuropathy (HCC)   Essential hypertension   Hypothyroidism   IBS (irritable bowel syndrome)   Acute on chronic diastolic CHF (congestive heart failure) (HCC)   CKD (chronic kidney disease) stage 4, GFR 15-29 ml/min (HCC)    # Acute hypoxic respiratory failure:  Continue supplemental O2 inhalation and gradually wean off He may have underlying chronic hypoxic respiratory failure and require home O2 inhalation on  discharge 8/17 follow-up chest x-ray tomorrow a.m.  # COPD exacerbation. 8/12 Solu-Medrol  40 mg IV x 2 doses followed by prednisone  40 mg p.o. daily x 2 doses, 30 mg pod x 2 doses Started Brovana  nebulizer twice daily, DuoNeb every 6 hourly scheduled CT chest showed pleural effusion and atelectasis 8/12 s/p left thoracentesis 100 mL fluid was tapped.  No WBC/organism, culture negative.  High LDH, low protein, partially exudative.    AKI on CKD stage IV, with proteinuria Patient is volume overload secondary to CKD Continue IV Lasix  as per nephro Monitor urine output and renal functions daily Patient may need hemodialysis in near future   Fluid overload, most likely due to CKD IV acute on chronic diastolic CHF: S/p IV Lasix  x 1 dose on 05/03/2024.   BNP 172.1. 2D echo on 04/19/2024 showed EF estimated at 55 to 60%, indeterminate LV diastolic parameters, left and right atrial were not assessed, technically difficult study. 8/13 increased Lasix  80 mg IV twice daily   Hyperkalemia secondary to renal failure 8/13 Lokelma  given.  Hyperkalemia resolved 8/18 K 5.8, Lokelma  10 g x 2 doses ordered Monitor potassium level daily   Hypoalbuminemia likely contributing to fluid overload.  Albumin  1.9. Probable AKI on CKD stage IV versus progressive CKD stage IV: Creatinine slowly trending upward.   Consulted Nephrologist, s/p Iv albumin   # Febrile 8/18 noticed low-grade fever, no source of infection Empirically started ceftriaxone  1 g IV daily Follow blood culture and urine cultures/UA    Suspected anaphylactic reaction: S/p EpiPen  on 05/02/2024, IV Pepcid  and IV Solu-Medrol .  Difficult to  rule in or exclude anaphylactic reaction at this time. Facial swelling was probably due to fluid overload.   Type II DM with hyperglycemia: IV steroids likely contributed to severe hyperglycemia. Hemoglobin A1c is 6.0 on 04/14/2024. 8/14 increased Semglee  18 units twice daily and NovoLog  6 units 3 times  daily with meals Hyperglycemia due to steroids   Comorbidities Peripheral neuropathy, hypertension, hypothyroidism, IBS, bilateral BKA  Recent discharge from the hospital on 05/01/2024 after hospitalization for AKI, catheter associated UTI, suspected pneumonia, acute hypoxic respiratory failure, gastroparesis, ileus, chronic opioid use   Abdominal discomfort, could be gastroparesis versus ileus CT abdomen pelvis negative for any obstruction  # Vitamin D  deficiency: started vitamin D  50,000 units p.o. weekly, follow with PCP to repeat vitamin D  level after 3 to 6 months.  # Vitamin B12 level 240, at lower end, goal >400,  started vitamin B12 supplement to prevent deficiency.  Repeat B12 level after 3 to 6 months.  # Folic acid  level 6.8, at lower end, started folic acid  1 mg p.o. daily to prevent deficiency.  Repeat folic acid  level after 3 to 6 months.   Diet Order             Diet Carb Modified Fluid consistency: Thin; Room service appropriate? Yes; Fluid restriction: 1500 mL Fluid  Diet effective now                    Consultants: Nephrologist Pulmonologist  Procedures: 8/12 s/p left thoracentesis    Medications:    arformoterol   15 mcg Nebulization BID   bisacodyl   10 mg Oral QHS   Chlorhexidine  Gluconate Cloth  6 each Topical Daily   vitamin B-12  1,000 mcg Oral Daily   diphenhydrAMINE   25 mg Oral Q6H   famotidine   10 mg Oral Daily   feeding supplement (GLUCERNA SHAKE)  237 mL Oral TID BM   folic acid   1 mg Oral Daily   furosemide   80 mg Intravenous Q12H   gabapentin   300 mg Oral QHS   heparin  injection (subcutaneous)  5,000 Units Subcutaneous Q8H   insulin  aspart  0-15 Units Subcutaneous TID WC   insulin  aspart  6 Units Subcutaneous TID WC   insulin  glargine-yfgn  18 Units Subcutaneous BID   ipratropium-albuterol   3 mL Nebulization BID   levothyroxine   175 mcg Oral Q0600   linaclotide   145 mcg Oral QAC breakfast   linagliptin   5 mg Oral Daily    melatonin  10 mg Oral QHS   metoCLOPramide   5 mg Oral TID AC   metoprolol  succinate  25 mg Oral Daily   polyethylene glycol  17 g Oral BID   sodium zirconium cyclosilicate   10 g Oral TID   Vitamin D  (Ergocalciferol )  50,000 Units Oral Q7 days   Continuous Infusions:  cefTRIAXone  (ROCEPHIN )  IV        Anti-infectives (From admission, onward)    Start     Dose/Rate Route Frequency Ordered Stop   05/11/24 1800  cefTRIAXone  (ROCEPHIN ) 1 g in sodium chloride  0.9 % 100 mL IVPB        1 g 200 mL/hr over 30 Minutes Intravenous Every 24 hours 05/11/24 1629           Family Communication/Anticipated D/C date and plan/Code Status   DVT prophylaxis: heparin  injection 5,000 Units Start: 05/03/24 2200     Code Status: Do not attempt resuscitation (DNR) PRE-ARREST INTERVENTIONS DESIRED  Family Communication: None Disposition Plan: Plan to discharge to SNF  Status is: Inpatient Remains inpatient appropriate because: AKI, acute respiratory failure     Subjective:   No significant events overnight.  Patient denies any chest pain or shortness of breath, no new worsening of symptoms.  Does not feel improvement in the edema. Fluid striction advised.    Objective:    Vitals:   05/11/24 0745 05/11/24 0909 05/11/24 1215 05/11/24 1635  BP:  (!) 142/122 (!) 150/100 117/64  Pulse:  90 94 80  Resp:  18 20 18   Temp:  98.2 F (36.8 C) 100.1 F (37.8 C) (!) 100.8 F (38.2 C)  TempSrc:      SpO2: 93% 93% 93% 96%  Weight:      Height:       No data found.   Intake/Output Summary (Last 24 hours) at 05/11/2024 1644 Last data filed at 05/11/2024 1012 Gross per 24 hour  Intake 420 ml  Output 1300 ml  Net -880 ml   Filed Weights   05/03/24 2039 05/07/24 0600  Weight: (!) 137.6 kg 136 kg    Exam:   GEN: Mild to moderate distress due to SOB, appears hypervolemic, and tachypneic SKIN: Erythema of upper back EYES: No pallor or icterus ENT: MMM.  No tongue swelling,  periorbital swelling, lip swelling no obvious facial swelling.   CV: RRR PULM: Equal air entry bilaterally, mild bibasilar crackles, no more wheezes resolved after steroids.   ABD: soft, ND, NT, +BS CNS: AAO x 3, non focal EXT: s/p BL BKA, edema of bilateral thighs and edema of b/l Arms L>R possibly dependent edema, advised to keep arms elevated GU: Suprapubic catheter intact    Data Reviewed:   I have personally reviewed following labs and imaging studies:  Labs: Labs show the following:   Basic Metabolic Panel: Recent Labs  Lab 05/07/24 0653 05/08/24 0520 05/09/24 0518 05/10/24 0536 05/11/24 0635  NA 136 138 137 136 136  K 4.9 5.1 4.8 5.3* 5.8*  CL 102 105 105 104 102  CO2 26 26 24 23 24   GLUCOSE 285* 240* 244* 257* 145*  BUN 72* 75* 75* 79* 77*  CREATININE 4.34* 4.39* 4.00* 3.94* 3.91*  CALCIUM  7.4* 7.5* 7.1* 7.2* 7.2*  MG 2.0 2.1 1.9 2.0 1.7  PHOS 5.3* 5.5* 4.6 4.5 3.9   GFR Estimated Creatinine Clearance: 23.7 mL/min (A) (by C-G formula based on SCr of 3.91 mg/dL (H)). Liver Function Tests: Recent Labs  Lab 05/05/24 0456 05/06/24 0523 05/07/24 0653 05/08/24 0520  AST  --  14* 19 16  ALT  --  13 15 17   ALKPHOS  --  41 41 48  BILITOT  --  0.3 0.3 0.7  PROT  --  5.5* 5.3* 5.7*  ALBUMIN  1.9* 2.0* 2.0* 2.1*   Recent Labs  Lab 05/06/24 1817  LIPASE 25   No results for input(s): AMMONIA in the last 168 hours. Coagulation profile No results for input(s): INR, PROTIME in the last 168 hours.  CBC: Recent Labs  Lab 05/07/24 0653 05/08/24 0520 05/09/24 0518 05/10/24 0536 05/11/24 0635  WBC 8.9 8.2 8.0 8.4 7.4  HGB 9.3* 9.7* 9.2* 9.7* 10.2*  HCT 29.5* 30.4* 29.1* 30.5* 32.1*  MCV 91.9 91.8 92.1 91.3 93.6  PLT 238 236 195 201 158   Cardiac Enzymes: No results for input(s): CKTOTAL, CKMB, CKMBINDEX, TROPONINI in the last 168 hours. BNP (last 3 results) No results for input(s): PROBNP in the last 8760 hours. CBG: Recent Labs  Lab  05/10/24 1142 05/10/24 1629 05/10/24  2153 05/11/24 0905 05/11/24 1214  GLUCAP 299* 210* 245* 146* 113*   D-Dimer: No results for input(s): DDIMER in the last 72 hours. Hgb A1c: No results for input(s): HGBA1C in the last 72 hours. Lipid Profile: No results for input(s): CHOL, HDL, LDLCALC, TRIG, CHOLHDL, LDLDIRECT in the last 72 hours. Thyroid function studies: No results for input(s): TSH, T4TOTAL, T3FREE, THYROIDAB in the last 72 hours.  Invalid input(s): FREET3 Anemia work up: No results for input(s): VITAMINB12, FOLATE, FERRITIN, TIBC, IRON , RETICCTPCT in the last 72 hours.  Sepsis Labs: Recent Labs  Lab 05/08/24 0520 05/09/24 0518 05/10/24 0536 05/11/24 0635  WBC 8.2 8.0 8.4 7.4    Microbiology Recent Results (from the past 240 hours)  Resp panel by RT-PCR (RSV, Flu A&B, Covid) Anterior Nasal Swab     Status: None   Collection Time: 05/02/24  8:06 PM   Specimen: Anterior Nasal Swab  Result Value Ref Range Status   SARS Coronavirus 2 by RT PCR NEGATIVE NEGATIVE Final    Comment: (NOTE) SARS-CoV-2 target nucleic acids are NOT DETECTED.  The SARS-CoV-2 RNA is generally detectable in upper respiratory specimens during the acute phase of infection. The lowest concentration of SARS-CoV-2 viral copies this assay can detect is 138 copies/mL. A negative result does not preclude SARS-Cov-2 infection and should not be used as the sole basis for treatment or other patient management decisions. A negative result may occur with  improper specimen collection/handling, submission of specimen other than nasopharyngeal swab, presence of viral mutation(s) within the areas targeted by this assay, and inadequate number of viral copies(<138 copies/mL). A negative result must be combined with clinical observations, patient history, and epidemiological information. The expected result is Negative.  Fact Sheet for Patients:   BloggerCourse.com  Fact Sheet for Healthcare Providers:  SeriousBroker.it  This test is no t yet approved or cleared by the [place]  FDA and  has been authorized for detection and/or diagnosis of SARS-CoV-2 by FDA under an Emergency Use Authorization (EUA). This EUA will remain  in effect (meaning this test can be used) for the duration of the COVID-19 declaration under Section 564(b)(1) of the Act, 21 U.S.C.section 360bbb-3(b)(1), unless the authorization is terminated  or revoked sooner.       Influenza A by PCR NEGATIVE NEGATIVE Final   Influenza B by PCR NEGATIVE NEGATIVE Final    Comment: (NOTE) The Xpert Xpress SARS-CoV-2/FLU/RSV plus assay is intended as an aid in the diagnosis of influenza from Nasopharyngeal swab specimens and should not be used as a sole basis for treatment. Nasal washings and aspirates are unacceptable for Xpert Xpress SARS-CoV-2/FLU/RSV testing.  Fact Sheet for Patients: BloggerCourse.com  Fact Sheet for Healthcare Providers: SeriousBroker.it  This test is not yet approved or cleared by the [place]  FDA and has been authorized for detection and/or diagnosis of SARS-CoV-2 by FDA under an Emergency Use Authorization (EUA). This EUA will remain in effect (meaning this test can be used) for the duration of the COVID-19 declaration under Section 564(b)(1) of the Act, 21 U.S.C. section 360bbb-3(b)(1), unless the authorization is terminated or revoked.     Resp Syncytial Virus by PCR NEGATIVE NEGATIVE Final    Comment: (NOTE) Fact Sheet for Patients: BloggerCourse.com  Fact Sheet for Healthcare Providers: SeriousBroker.it  This test is not yet approved or cleared by the [place]  FDA and has been authorized for detection and/or diagnosis of SARS-CoV-2 by FDA under an Emergency Use  Authorization (EUA). This EUA will remain in effect (  meaning this test can be used) for the duration of the COVID-19 declaration under Section 564(b)(1) of the Act, 21 U.S.C. section 360bbb-3(b)(1), unless the authorization is terminated or revoked.  Performed at [facility], [address] [place], [place] [address]   Body fluid culture w Gram Stain     Status: None   Collection Time: 05/05/24  4:10 PM   Specimen: PATH Cytology Pleural fluid  Result Value Ref Range Status   Specimen Description   Final    FLUID Performed at [facility], [address] [place], [place] [address]    Special Requests   Final    CYTO PLEU Performed at [facility], [address] [place], [place] [address]    Gram Stain NO WBC SEEN NO ORGANISMS SEEN CYTOSPIN SMEAR   Final   Culture   Final    NO GROWTH 3 DAYS Performed at [facility], [address], [place], [place] [address]    Report Status 05/09/2024 FINAL  Final    Procedures and diagnostic studies:  DG Chest Port 1 View Result Date: 05/11/2024 CLINICAL DATA:  Pleural effusion EXAM: PORTABLE CHEST 1 VIEW COMPARISON:  05/08/2019 FINDINGS: Cardiac shadow is stable. Postsurgical change in the thoracic spine is noted. Left-sided pleural effusion is noted. No sizable right effusion is noted. Mild right basilar atelectasis is seen. IMPRESSION: Left-sided pleural effusion. Right basilar atelectasis. Electronically Signed   By: Oneil Devonshire M.D.   On: 05/11/2024 10:38      Total time spent: 55 minutes   LOS: 8 days   Mckinnley Smithey Von  [facility]   Pager on [email or web address]. If 7PM-7AM, please contact night-coverage at [email or web address]   05/11/2024, 4:44 PM

## 2024-05-12 ENCOUNTER — Inpatient Hospital Stay

## 2024-05-12 DIAGNOSIS — J90 Pleural effusion, not elsewhere classified: Secondary | ICD-10-CM | POA: Diagnosis not present

## 2024-05-12 DIAGNOSIS — N179 Acute kidney failure, unspecified: Secondary | ICD-10-CM | POA: Diagnosis not present

## 2024-05-12 DIAGNOSIS — J9601 Acute respiratory failure with hypoxia: Secondary | ICD-10-CM | POA: Diagnosis not present

## 2024-05-12 DIAGNOSIS — N049 Nephrotic syndrome with unspecified morphologic changes: Secondary | ICD-10-CM | POA: Diagnosis not present

## 2024-05-12 LAB — CBC
HCT: 27 % — ABNORMAL LOW (ref 39.0–52.0)
Hemoglobin: 8.4 g/dL — ABNORMAL LOW (ref 13.0–17.0)
MCH: 29.2 pg (ref 26.0–34.0)
MCHC: 31.1 g/dL (ref 30.0–36.0)
MCV: 93.8 fL (ref 80.0–100.0)
Platelets: 125 K/uL — ABNORMAL LOW (ref 150–400)
RBC: 2.88 MIL/uL — ABNORMAL LOW (ref 4.22–5.81)
RDW: 12.8 % (ref 11.5–15.5)
WBC: 8.8 K/uL (ref 4.0–10.5)
nRBC: 0 % (ref 0.0–0.2)

## 2024-05-12 LAB — GLUCOSE, CAPILLARY
Glucose-Capillary: 93 mg/dL (ref 70–99)
Glucose-Capillary: 83 mg/dL (ref 70–99)
Glucose-Capillary: 145 mg/dL — ABNORMAL HIGH (ref 70–99)
Glucose-Capillary: 100 mg/dL — ABNORMAL HIGH (ref 70–99)
Glucose-Capillary: 109 mg/dL — ABNORMAL HIGH (ref 70–99)

## 2024-05-12 LAB — BASIC METABOLIC PANEL WITH GFR
Anion gap: 11 (ref 5–15)
BUN: 80 mg/dL — ABNORMAL HIGH (ref 8–23)
CO2: 24 mmol/L (ref 22–32)
Calcium: 7 mg/dL — ABNORMAL LOW (ref 8.9–10.3)
Chloride: 104 mmol/L (ref 98–111)
Creatinine, Ser: 4.53 mg/dL — ABNORMAL HIGH (ref 0.61–1.24)
GFR, Estimated: 14 mL/min — ABNORMAL LOW (ref >60)
Glucose, Bld: 78 mg/dL (ref 70–99)
Potassium: 5.7 mmol/L — ABNORMAL HIGH (ref 3.5–5.1)
Sodium: 139 mmol/L (ref 135–145)

## 2024-05-12 LAB — MRSA NEXT GEN BY PCR, NASAL: MRSA by PCR Next Gen: DETECTED — AB

## 2024-05-12 LAB — MAGNESIUM: Magnesium: 1.7 mg/dL (ref 1.7–2.4)

## 2024-05-12 LAB — LACTIC ACID, PLASMA: Lactic Acid, Venous: 1 mmol/L (ref 0.5–1.9)

## 2024-05-12 LAB — PHOSPHORUS: Phosphorus: 4.6 mg/dL (ref 2.5–4.6)

## 2024-05-12 MED ORDER — SODIUM CHLORIDE 3 % IN NEBU
4.0000 mL | INHALATION_SOLUTION | Freq: Two times a day (BID) | RESPIRATORY_TRACT | Status: AC
  Administered 2024-05-12 – 2024-05-14 (×5): 4 mL via RESPIRATORY_TRACT
  Filled 2024-05-12 (×6): qty 4

## 2024-05-12 MED ORDER — MINERAL OIL RE ENEM
1.0000 | ENEMA | Freq: Once | RECTAL | Status: AC
  Administered 2024-05-12: 1 via RECTAL

## 2024-05-12 MED ORDER — LINEZOLID 600 MG/300ML IV SOLN
600.0000 mg | Freq: Two times a day (BID) | INTRAVENOUS | Status: AC
  Administered 2024-05-12 – 2024-05-15 (×7): 600 mg via INTRAVENOUS
  Filled 2024-05-12 (×7): qty 300

## 2024-05-12 MED ORDER — INSULIN GLARGINE-YFGN 100 UNIT/ML ~~LOC~~ SOLN
14.0000 [IU] | Freq: Two times a day (BID) | SUBCUTANEOUS | Status: DC
  Administered 2024-05-12: 14 [IU] via SUBCUTANEOUS
  Filled 2024-05-12 (×2): qty 0.14

## 2024-05-12 MED ORDER — INSULIN ASPART 100 UNIT/ML IJ SOLN
4.0000 [IU] | Freq: Three times a day (TID) | INTRAMUSCULAR | Status: DC
  Administered 2024-05-12 – 2024-05-18 (×15): 4 [IU] via SUBCUTANEOUS
  Filled 2024-05-12 (×16): qty 1

## 2024-05-12 MED ORDER — FUROSEMIDE 10 MG/ML IJ SOLN: 80.0000 mg | Freq: Two times a day (BID) | INTRAMUSCULAR | Status: DC

## 2024-05-12 MED ORDER — ENSURE PLUS HIGH PROTEIN PO LIQD
237.0000 mL | Freq: Two times a day (BID) | ORAL | Status: DC
  Administered 2024-05-14 – 2024-05-18 (×6): 237 mL via ORAL

## 2024-05-12 MED ORDER — MIDODRINE HCL 5 MG PO TABS
10.0000 mg | ORAL_TABLET | Freq: Three times a day (TID) | ORAL | Status: DC
  Administered 2024-05-12 – 2024-05-15 (×5): 10 mg via ORAL
  Filled 2024-05-12 (×7): qty 2

## 2024-05-12 MED ORDER — BISACODYL 10 MG RE SUPP
10.0000 mg | Freq: Every day | RECTAL | Status: DC
  Administered 2024-05-14: 10 mg via RECTAL
  Filled 2024-05-12 (×2): qty 1

## 2024-05-12 MED ORDER — SODIUM CHLORIDE 0.9 % IV SOLN: INTRAVENOUS | Status: AC

## 2024-05-12 MED ORDER — ALBUTEROL SULFATE (2.5 MG/3ML) 0.083% IN NEBU
2.5000 mg | INHALATION_SOLUTION | Freq: Four times a day (QID) | RESPIRATORY_TRACT | Status: DC | PRN
  Administered 2024-05-12: 2.5 mg via RESPIRATORY_TRACT
  Filled 2024-05-12: qty 3

## 2024-05-12 MED ORDER — CHLORHEXIDINE GLUCONATE CLOTH 2 % EX PADS
6.0000 | MEDICATED_PAD | Freq: Every day | CUTANEOUS | Status: DC
  Administered 2024-05-13 – 2024-05-16 (×4): 6 via TOPICAL

## 2024-05-12 MED ORDER — SODIUM ZIRCONIUM CYCLOSILICATE 5 G PO PACK
10.0000 g | PACK | Freq: Three times a day (TID) | ORAL | Status: AC
  Administered 2024-05-12: 10 g via ORAL
  Filled 2024-05-12: qty 2

## 2024-05-12 NOTE — Progress Notes (Signed)
 It's hyperkalemia not hypokalemia.

## 2024-05-12 NOTE — Progress Notes (Signed)
 Progress Note    Eugene [name]  FMW:[id number] DOB: 1960-10-08  DOA: 05/02/2024 PCP: [facility]      Brief Narrative:    Medical records reviewed and are as summarized below:  Eugene [name] is a 63 y.o. male with medical history significant for anxiety, depression, type 2 diabetes mellitus, dyslipidemia, hepatitis C, spinal stenosis, IBS, osteomyelitis, status post bilateral BKA, who presented to the emergency room with acute onset of dyspnea with associated facial and periorbital swelling with associated wheezing. He said he was told by the staff at the nursing home but his face was swollen and he was probably having an allergic reaction.  He is not aware of any recent change in diet or medication that will trigger any allergic reaction.  No fever or chills.  The patient was discharged from here on 7/22 after being managed for AKI, catheter associated UTI, hyperkalemia, ileus and acute hypoxic respiratory failure.    Initial vital signs in the ED temperature 99.1 F, respiratory rate 20, pulse 74, BP 122/82, O2 sat 93% on 6 L of oxygen.  Chest x-ray impression:  Overall, unchanged appearance of the bilateral pleural effusions and mid and lower lung airspace disease, likely atelectasis   He was given 1 dose of EpiPen , IV steroids, IV famotidine  and IV fluids for suspected anaphylaxis.   Assessment/Plan:   Principal Problem:   Anasarca associated with disorder of kidney Active Problems:   Anaphylaxis   Type 2 diabetes mellitus with peripheral neuropathy (HCC)   Essential hypertension   Hypothyroidism   Acute on chronic respiratory failure with hypoxia (HCC)   IBS (irritable bowel syndrome)   Acute on chronic diastolic CHF (congestive heart failure) (HCC)   CKD (chronic kidney disease) stage 4, GFR 15-29 ml/min (HCC)    # Acute hypoxic respiratory failure:  Continue supplemental O2 inhalation and gradually wean off He may have underlying chronic  hypoxic respiratory failure and require home O2 inhalation on discharge 8/19 worsening of shortness of breath, patient was transferred to stepdown, required high flow nasal cannula, currently back to 6 L per CT chest reviewed. PCCM was following, stable to transfer to progressive today Patient was febrile, suspected infection started antibiotics. Follow cultures and de-escalate antibiotics   # COPD exacerbation. 8/12 Solu-Medrol  40 mg IV x 2 doses followed by prednisone  40 mg p.o. daily x 2 doses, 30 mg pod x 2 doses Started Brovana  nebulizer twice daily, DuoNeb every 6 hourly scheduled CT chest showed pleural effusion and atelectasis 8/12 s/p left thoracentesis 100 mL fluid was tapped.  No WBC/organism, culture negative.  High LDH, low protein, partially exudative.    AKI on CKD stage IV, with proteinuria Patient is volume overload secondary to CKD Continue IV Lasix  as per nephro Monitor urine output and renal functions daily Patient may need hemodialysis in near future   Fluid overload, most likely due to CKD IV acute on chronic diastolic CHF: S/p IV Lasix  x 1 dose on 05/03/2024.   BNP 172.1. 2D echo on 04/19/2024 showed EF estimated at 55 to 60%, indeterminate LV diastolic parameters, left and right atrial were not assessed, technically difficult study. 8/13 increased Lasix  80 mg IV twice daily   Hyperkalemia secondary to renal failure 8/13 Lokelma  given.  Hyperkalemia resolved 8/18 K 5.8, Lokelma  10 g x 2 doses ordered Monitor potassium level daily   Hypoalbuminemia likely contributing to fluid overload.  Albumin  1.9. Probable AKI on CKD stage IV versus progressive CKD stage IV: Creatinine slowly  trending upward.   Consulted Nephrologist, s/p Iv albumin    Suspected anaphylactic reaction: S/p EpiPen  on 05/02/2024, IV Pepcid  and IV Solu-Medrol .  Difficult to rule in or exclude anaphylactic reaction at this time. Facial swelling was probably due to fluid overload.   Type II  DM with hyperglycemia: IV steroids likely contributed to severe hyperglycemia. Hemoglobin A1c is 6.0 on 04/14/2024. 8/14 increased Semglee  18 units twice daily and NovoLog  6 units 3 times daily with meals Hyperglycemia due to steroids   Hypertension, developed hypotension on 8/19 Patient developed high blood pressure due to fecal impaction and treated with IV Lopressor .  Patient was on oral Lopressor  which was discontinued on 8/19 due to low BP 8/19 hypotension, started midodrine  10 mg p.o. 3 times daily Monitor BP and titrate medications accordingly   Abdominal discomfort, could be gastroparesis versus ileus CT abdomen pelvis negative for any obstruction 8/19 constipation and fecal impaction caused respiratory distress and tachycardia, patient was transferred to ICU.  CT scan showed fecal impaction.  Patient refused enema on 8/18 8/19 agreed for enema today.   # Vitamin D  deficiency: started vitamin D  50,000 units p.o. weekly, follow with PCP to repeat vitamin D  level after 3 to 6 months.  # Vitamin B12 level 240, at lower end, goal >400,  started vitamin B12 supplement to prevent deficiency.  Repeat B12 level after 3 to 6 months.  # Folic acid  level 6.8, at lower end, started folic acid  1 mg p.o. daily to prevent deficiency.  Repeat folic acid  level after 3 to 6 months.  Comorbidities Peripheral neuropathy, hypertension, hypothyroidism, IBS, bilateral BKA  Recent discharge from the hospital on 05/01/2024 after hospitalization for AKI, catheter associated UTI, suspected pneumonia, acute hypoxic respiratory failure, gastroparesis, ileus, chronic opioid use  Diet Order             Diet Carb Modified Fluid consistency: Thin; Room service appropriate? Yes; Fluid restriction: 1500 mL Fluid  Diet effective now                    Consultants: Nephrologist Pulmonologist  Procedures: 8/12 s/p left thoracentesis    Medications:    arformoterol   15 mcg Nebulization BID    bisacodyl   10 mg Oral QHS   bisacodyl   10 mg Rectal Daily   [START ON 05/13/2024] Chlorhexidine  Gluconate Cloth  6 each Topical QHS   vitamin B-12  1,000 mcg Oral Daily   famotidine   10 mg Oral Daily   feeding supplement  237 mL Oral BID BM   folic acid   1 mg Oral Daily   gabapentin   300 mg Oral QHS   heparin  injection (subcutaneous)  5,000 Units Subcutaneous Q8H   insulin  aspart  0-15 Units Subcutaneous TID WC   insulin  aspart  4 Units Subcutaneous TID WC   insulin  glargine-yfgn  14 Units Subcutaneous BID   ipratropium-albuterol   3 mL Nebulization BID   levothyroxine   175 mcg Oral Q0600   linaclotide   145 mcg Oral QAC breakfast   linagliptin   5 mg Oral Daily   melatonin  10 mg Oral QHS   metoCLOPramide   5 mg Oral TID AC   metoprolol  succinate  25 mg Oral Daily   polyethylene glycol  17 g Oral BID   sodium chloride  HYPERTONIC  4 mL Nebulization BID   Vitamin D  (Ergocalciferol )  50,000 Units Oral Q7 days   Continuous Infusions:  sodium chloride  50 mL/hr at 05/12/24 1514   linezolid  (ZYVOX ) IV Stopped (05/12/24 1227)  piperacillin -tazobactam (ZOSYN )  IV 12.5 mL/hr at 05/12/24 1514      Anti-infectives (From admission, onward)    Start     Dose/Rate Route Frequency Ordered Stop   05/12/24 1200  linezolid  (ZYVOX ) IVPB 600 mg        600 mg 300 mL/hr over 60 Minutes Intravenous Every 12 hours 05/12/24 1025     05/11/24 1930  piperacillin -tazobactam (ZOSYN ) IVPB 3.375 g        3.375 g 12.5 mL/hr over 240 Minutes Intravenous Every 8 hours 05/11/24 1850     05/11/24 1800  cefTRIAXone  (ROCEPHIN ) 1 g in sodium chloride  0.9 % 100 mL IVPB  Status:  Discontinued        1 g 200 mL/hr over 30 Minutes Intravenous Every 24 hours 05/11/24 1629 05/11/24 1850         Family Communication/Anticipated D/C date and plan/Code Status   DVT prophylaxis: heparin  injection 5,000 Units Start: 05/03/24 2200     Code Status: Do not attempt resuscitation (DNR) PRE-ARREST INTERVENTIONS  DESIRED  Family Communication: None Disposition Plan: Plan to discharge to SNF   Status is: Inpatient Remains inpatient appropriate because: AKI, acute respiratory failure     Subjective:   Yesterday evening rep response was called because patient had respiratory distress, O2 sats dropped and patient was very hypertensive.  And febrile. Patient was transferred to stepdown unit, patient was found to have infection unknown source and fecal impaction.  Last night patient refused enema, abdominal x-ray repeated and still shows stool burden.  Today he agreed for enema. Overall patient feels improvement, denied any active issues, no complaints Respiratory status improved, will be transferred to progressive unit. Blood pressure is still low, started midodrine .     Objective:    Vitals:   05/12/24 1418 05/12/24 1518 05/12/24 1600 05/12/24 1703  BP: (!) 96/57 (!) 102/55 (!) 79/61 (!) 94/56  Pulse: 75 (!) 33 78 (!) 40  Resp: 13     Temp:      TempSrc:      SpO2: 97% 94% 94% (!) 88%  Weight:      Height:       No data found.   Intake/Output Summary (Last 24 hours) at 05/12/2024 1741 Last data filed at 05/12/2024 1514 Gross per 24 hour  Intake 1450.77 ml  Output 790 ml  Net 660.77 ml   Filed Weights   05/03/24 2039 05/07/24 0600  Weight: (!) 137.6 kg 136 kg    Exam:   GEN: Mild to moderate distress due to SOB, appears hypervolemic, and tachypneic SKIN: Erythema of upper back EYES: No pallor or icterus ENT: MMM.  No tongue swelling, periorbital swelling, lip swelling no obvious facial swelling.   CV: RRR PULM: Equal air entry bilaterally, mild bibasilar crackles, No wheezes ABD: soft, ND, NT, +BS CNS: AAO x 3, non focal EXT: s/p BL BKA, edema of bilateral thighs and edema of b/l Arms L>R possibly dependent edema, advised to keep arms elevated GU: Suprapubic catheter intact    Data Reviewed:   I have personally reviewed following labs and imaging  studies:  Labs: Labs show the following:   Basic Metabolic Panel: Recent Labs  Lab 05/08/24 0520 05/09/24 0518 05/10/24 0536 05/11/24 0635 05/11/24 2132 05/12/24 0558  NA 138 137 136 136 135 139  K 5.1 4.8 5.3* 5.8* 5.5* 5.7*  CL 105 105 104 102 101 104  CO2 26 24 23 24 24 24   GLUCOSE 240* 244* 257* 145* 80 78  BUN 75* 75* 79* 77* 82* 80*  CREATININE 4.39* 4.00* 3.94* 3.91* 4.37* 4.53*  CALCIUM  7.5* 7.1* 7.2* 7.2* 7.4* 7.0*  MG 2.1 1.9 2.0 1.7 1.7 1.7  PHOS 5.5* 4.6 4.5 3.9  --  4.6   GFR Estimated Creatinine Clearance: 20.4 mL/min (A) (by C-G formula based on SCr of 4.53 mg/dL (H)). Liver Function Tests: Recent Labs  Lab 05/06/24 0523 05/07/24 0653 05/08/24 0520  AST 14* 19 16  ALT 13 15 17   ALKPHOS 41 41 48  BILITOT 0.3 0.3 0.7  PROT 5.5* 5.3* 5.7*  ALBUMIN  2.0* 2.0* 2.1*   Recent Labs  Lab 05/06/24 1817  LIPASE 25   No results for input(s): AMMONIA in the last 168 hours. Coagulation profile No results for input(s): INR, PROTIME in the last 168 hours.  CBC: Recent Labs  Lab 05/09/24 0518 05/10/24 0536 05/11/24 0635 05/11/24 2240 05/12/24 0558  WBC 8.0 8.4 7.4 6.6 8.8  HGB 9.2* 9.7* 10.2* 9.4* 8.4*  HCT 29.1* 30.5* 32.1* 30.1* 27.0*  MCV 92.1 91.3 93.6 93.2 93.8  PLT 195 201 158 137* 125*   Cardiac Enzymes: No results for input(s): CKTOTAL, CKMB, CKMBINDEX, TROPONINI in the last 168 hours. BNP (last 3 results) No results for input(s): PROBNP in the last 8760 hours. CBG: Recent Labs  Lab 05/11/24 2121 05/12/24 0347 05/12/24 0801 05/12/24 1110 05/12/24 1701  GLUCAP 75 93 83 145* 100*   D-Dimer: No results for input(s): DDIMER in the last 72 hours. Hgb A1c: No results for input(s): HGBA1C in the last 72 hours. Lipid Profile: No results for input(s): CHOL, HDL, LDLCALC, TRIG, CHOLHDL, LDLDIRECT in the last 72 hours. Thyroid function studies: No results for input(s): TSH, T4TOTAL, T3FREE, THYROIDAB  in the last 72 hours.  Invalid input(s): FREET3 Anemia work up: No results for input(s): VITAMINB12, FOLATE, FERRITIN, TIBC, IRON , RETICCTPCT in the last 72 hours.  Sepsis Labs: Recent Labs  Lab 05/10/24 0536 05/11/24 0635 05/11/24 2132 05/11/24 2240 05/12/24 0047 05/12/24 0558  WBC 8.4 7.4  --  6.6  --  8.8  LATICACIDVEN  --   --  0.8  --  1.0  --     Microbiology Recent Results (from the past 240 hours)  Resp panel by RT-PCR (RSV, Flu A&B, Covid) Anterior Nasal Swab     Status: None   Collection Time: 05/02/24  8:06 PM   Specimen: Anterior Nasal Swab  Result Value Ref Range Status   SARS Coronavirus 2 by RT PCR NEGATIVE NEGATIVE Final    Comment: (NOTE) SARS-CoV-2 target nucleic acids are NOT DETECTED.  The SARS-CoV-2 RNA is generally detectable in upper respiratory specimens during the acute phase of infection. The lowest concentration of SARS-CoV-2 viral copies this assay can detect is 138 copies/mL. A negative result does not preclude SARS-Cov-2 infection and should not be used as the sole basis for treatment or other patient management decisions. A negative result may occur with  improper specimen collection/handling, submission of specimen other than nasopharyngeal swab, presence of viral mutation(s) within the areas targeted by this assay, and inadequate number of viral copies(<138 copies/mL). A negative result must be combined with clinical observations, patient history, and epidemiological information. The expected result is Negative.  Fact Sheet for Patients:  BloggerCourse.com  Fact Sheet for Healthcare Providers:  SeriousBroker.it  This test is no t yet approved or cleared by the [place]  FDA and  has been authorized for detection and/or diagnosis of SARS-CoV-2 by FDA under an Emergency Use Authorization (  EUA). This EUA will remain  in effect (meaning this test can be used) for the  duration of the COVID-19 declaration under Section 564(b)(1) of the Act, 21 U.S.C.section 360bbb-3(b)(1), unless the authorization is terminated  or revoked sooner.       Influenza A by PCR NEGATIVE NEGATIVE Final   Influenza B by PCR NEGATIVE NEGATIVE Final    Comment: (NOTE) The Xpert Xpress SARS-CoV-2/FLU/RSV plus assay is intended as an aid in the diagnosis of influenza from Nasopharyngeal swab specimens and should not be used as a sole basis for treatment. Nasal washings and aspirates are unacceptable for Xpert Xpress SARS-CoV-2/FLU/RSV testing.  Fact Sheet for Patients: BloggerCourse.com  Fact Sheet for Healthcare Providers: SeriousBroker.it  This test is not yet approved or cleared by the [place]  FDA and has been authorized for detection and/or diagnosis of SARS-CoV-2 by FDA under an Emergency Use Authorization (EUA). This EUA will remain in effect (meaning this test can be used) for the duration of the COVID-19 declaration under Section 564(b)(1) of the Act, 21 U.S.C. section 360bbb-3(b)(1), unless the authorization is terminated or revoked.     Resp Syncytial Virus by PCR NEGATIVE NEGATIVE Final    Comment: (NOTE) Fact Sheet for Patients: BloggerCourse.com  Fact Sheet for Healthcare Providers: SeriousBroker.it  This test is not yet approved or cleared by the [place]  FDA and has been authorized for detection and/or diagnosis of SARS-CoV-2 by FDA under an Emergency Use Authorization (EUA). This EUA will remain in effect (meaning this test can be used) for the duration of the COVID-19 declaration under Section 564(b)(1) of the Act, 21 U.S.C. section 360bbb-3(b)(1), unless the authorization is terminated or revoked.  Performed at [facility], [address] [place], [place] [address]   Body fluid culture w Gram Stain     Status: None    Collection Time: 05/05/24  4:10 PM   Specimen: PATH Cytology Pleural fluid  Result Value Ref Range Status   Specimen Description   Final    FLUID Performed at [facility], [address], [place], [place] [address]    Special Requests   Final    CYTO PLEU Performed at [facility], [address] [place], [place] [address]    Gram Stain NO WBC SEEN NO ORGANISMS SEEN CYTOSPIN SMEAR   Final   Culture   Final    NO GROWTH 3 DAYS Performed at [facility], [address], [place], [place] [address]    Report Status 05/09/2024 FINAL  Final  MRSA Next Gen by PCR, Nasal     Status: Abnormal   Collection Time: 05/11/24  9:08 PM   Specimen: Nasal Mucosa; Nasal Swab  Result Value Ref Range Status   MRSA by PCR Next Gen DETECTED (A) NOT DETECTED Final    Comment: RESULT CALLED TO, [name] BACK BY AND VERIFIED WITH:  HOLLY CHANDLER AT 0007 05/12/24 JG (NOTE) The GeneXpert MRSA Assay (FDA approved for NASAL specimens only), is one component of a comprehensive MRSA colonization surveillance program. It is not intended to diagnose MRSA infection nor to guide or monitor treatment for MRSA infections. Test performance is not FDA approved in patients less than 15 years old. Performed at [facility], [address] [place], [place] [address]   Culture, blood (Routine X 2) w Reflex to ID Panel     Status: None (Preliminary result)   Collection Time: 05/11/24  9:32 PM   Specimen: BLOOD  Result Value Ref  Range Status   Specimen Description BLOOD BLOOD RIGHT HAND  Final   Special Requests   Final    BOTTLES DRAWN AEROBIC AND ANAEROBIC Blood Culture results may not be optimal due to an inadequate volume of blood received in culture bottles   Culture   Final    NO GROWTH < 12 HOURS Performed at [facility], [address] [place], [place] [address]    Report Status PENDING  Incomplete  Culture, blood (Routine X 2) w Reflex to ID Panel     Status:  None (Preliminary result)   Collection Time: 05/11/24  9:32 PM   Specimen: BLOOD  Result Value Ref Range Status   Specimen Description BLOOD BLOOD RIGHT ARM  Final   Special Requests   Final    BOTTLES DRAWN AEROBIC AND ANAEROBIC Blood Culture adequate volume   Culture   Final    NO GROWTH < 12 HOURS Performed at [facility], [address], [place], [place] [address]    Report Status PENDING  Incomplete    Procedures and diagnostic studies:  DG Chest Port 1 View Result Date: 05/12/2024 CLINICAL DATA:  Respiratory failure with hypoxia. Shortness of breath. Facial swelling. EXAM: PORTABLE CHEST 1 VIEW COMPARISON:  05/11/2024 FINDINGS: Stable mildly enlarged cardiac silhouette. Stable small to moderate-sized left pleural effusion, small right pleural effusion and bilateral lower lung zone linear atelectasis/scarring. Stable thoracic spine fixation hardware and post corpectomy hardware. IMPRESSION: 1. Stable small to moderate-sized left pleural effusion, small right pleural effusion and bilateral lower lung zone linear atelectasis/scarring. 2. Stable mild cardiomegaly. Electronically Signed   By: Elspeth Bathe M.D.   On: 05/12/2024 13:36   DG Abd 1 View Result Date: 05/12/2024 CLINICAL DATA:  [id number] Constipation [id number]. EXAM: ABDOMEN - 1 VIEW COMPARISON:  None Available. FINDINGS: The bowel gas pattern is non-obstructive. There is mild-to-moderate stool burden mainly overlying the rectum/sigmoid colon region. No evidence of pneumoperitoneum, within the limitations of a supine film. No acute osseous abnormalities. The soft tissues are within normal limits. Surgical changes, devices, tubes and lines: Thoracic spinal fixation hardware noted. IMPRESSION: Nonobstructive bowel gas pattern. Mild-to-moderate stool burden mainly overlying the rectum/sigmoid colon region. Electronically Signed   By: Ree Molt M.D.   On: 05/12/2024 10:54   CT CHEST ABDOMEN PELVIS WO CONTRAST Result Date:  05/11/2024 CLINICAL DATA:  Sepsis Shortness of Breath; Facial Swelling Mental status change, unknown cause Best possible images due to pt condition EXAM: CT CHEST, ABDOMEN AND PELVIS WITHOUT CONTRAST TECHNIQUE: Multidetector CT imaging of the chest, abdomen and pelvis was performed following the standard protocol without IV contrast. RADIATION DOSE REDUCTION: This exam was performed according to the departmental dose-optimization program which includes automated exposure control, adjustment of the mA and/or kV according to patient size and/or use of iterative reconstruction technique. COMPARISON:  CT abdomen pelvis 05/05/2024, CT chest 05/05/2024 FINDINGS: CT CHEST FINDINGS Cardiovascular: Normal heart size. No significant pericardial effusion. The thoracic aorta is normal in caliber. Mild atherosclerotic plaque of the thoracic aorta. Left anterior descending coronary artery calcifications. Mediastinum/Nodes: No gross hilar adenopathy, noting limited sensitivity for the detection of hilar adenopathy on this noncontrast study. no enlarged mediastinal or axillary lymph nodes. Thyroid gland and esophagus demonstrate no significant findings. Interval development of debris/mucous within the right mainstem bronchi measuring up to 1.3 cm. Lungs/Pleura: Limited evaluation due to noncontrast study and motion artifact. Bilateral lower lobes consolidations. No pulmonary nodule. No pulmonary mass. Redemonstration of bilateral small, left greater than right,  pleural effusions with pleural thickening. Pleural effusion. No pneumothorax. Musculoskeletal: Bilateral gynecomastia. No suspicious lytic or blastic osseous lesions. No acute displaced fracture. Degenerative changes of the cervical spine at the C5-C6 level. CT ABDOMEN PELVIS FINDINGS Hepatobiliary: No focal liver abnormality. No gallstones, gallbladder wall thickening, or pericholecystic fluid. No biliary dilatation. Pancreas: Diffusely atrophic. No focal lesion. Otherwise  normal pancreatic contour. No surrounding inflammatory changes. No main pancreatic ductal dilatation. Spleen: Normal in size without focal abnormality. Adrenals/Urinary Tract: No adrenal nodule bilaterally. No nephrolithiasis and no hydronephrosis. No definite contour-deforming renal mass. No ureterolithiasis or hydroureter. The urinary bladder is decompressed with suprapubic catheter terminating within the lumen. Stomach/Bowel: Stomach is within normal limits. No evidence of bowel wall thickening or dilatation. Stool within the rectosigmoid colon. Appendix appears normal. Vascular/Lymphatic: No abdominal aorta or iliac aneurysm. Moderate atherosclerotic plaque of the aorta and its branches. No abdominal, pelvic, or inguinal lymphadenopathy. Reproductive: Prostate is unremarkable. Other: No intraperitoneal free fluid. No intraperitoneal free gas. No organized fluid collection. Musculoskeletal: At least small fat containing umbilical hernia with an abdominal defect of 2.7 cm. Subcutaneus soft tissue edema. No suspicious lytic or blastic osseous lesions. No acute displaced fracture. Stable chronic T12 and L1 compression fractures. Thoracolumbar posterolateral surgical fusion surgical hardware. T8 and T9 corpectomy. IMPRESSION: 1. Interval development of debris within the right mainstem bronchi measuring up to 1.3 cm. 2. Bilateral lower lobe consolidations with limited evaluation on this noncontrast study. Finding may represent infection/inflammation versus atelectasis. 3. Redemonstration of bilateral small, left greater than right, pleural effusions with pleural thickening. 4. No acute intra-abdominal or intrapelvic abnormality. 5. Limited evaluation due to noncontrast study. 6.  Aortic Atherosclerosis (ICD10-I70.0). Electronically Signed   By: Morgane  Naveau M.D.   On: 05/11/2024 21:21   CT HEAD WO CONTRAST ( ) Result Date: 05/11/2024 CLINICAL DATA:  Mental status change, unknown cause EXAM: CT HEAD WITHOUT  CONTRAST TECHNIQUE: Contiguous axial images were obtained from the base of the skull through the vertex without intravenous contrast. RADIATION DOSE REDUCTION: This exam was performed according to the departmental dose-optimization program which includes automated exposure control, adjustment of the mA and/or kV according to patient size and/or use of iterative reconstruction technique. COMPARISON:  None Available. FINDINGS: Brain: No evidence of acute infarction, hemorrhage, hydrocephalus, extra-axial collection or mass lesion/mass effect. Vascular: No hyperdense vessel. Skull: No acute fracture. Sinuses/Orbits: Clear sinuses.  No acute orbital findings. Other: No mastoid effusions. IMPRESSION: No evidence of acute intracranial abnormality. Electronically Signed   By: Gilmore GORMAN Molt M.D.   On: 05/11/2024 21:17   DG Chest Port 1 View Result Date: 05/11/2024 CLINICAL DATA:  Pleural effusion EXAM: PORTABLE CHEST 1 VIEW COMPARISON:  05/08/2019 FINDINGS: Cardiac shadow is stable. Postsurgical change in the thoracic spine is noted. Left-sided pleural effusion is noted. No sizable right effusion is noted. Mild right basilar atelectasis is seen. IMPRESSION: Left-sided pleural effusion. Right basilar atelectasis. Electronically Signed   By: Oneil Devonshire M.D.   On: 05/11/2024 10:38      Total time spent: 55 minutes   LOS: 9 days   Katelinn Justice [employer]   Pager on [email or web address]. If 7PM-7AM, please contact night-coverage at [email or web address]   05/12/2024, 5:41 PM

## 2024-05-12 NOTE — Inpatient Diabetes Management (Signed)
 Inpatient Diabetes Program Recommendations  AACE/ADA: New Consensus Statement on Inpatient Glycemic Control   Target Ranges:  Prepandial:   less than 140 mg/dL      Peak postprandial:   less than 180 mg/dL (1-2 hours)      Critically ill patients:  140 - 180 mg/dL    Latest Reference Range & Units 05/11/24 09:05 05/11/24 12:14 05/11/24 16:34 05/11/24 20:08 05/11/24 21:02 05/11/24 21:21 05/12/24 03:47 05/12/24 08:01  Glucose-Capillary 70 - 99 mg/dL 853 (H) 886 (H) 874 (H) 84 64 (L) 75 93 83   Review of Glycemic Control  Diabetes history: DM2 Outpatient Diabetes medications: Semglee  10 units BID, Humalog  0-15 units QID, Januvia 100 mg daily Current orders for Inpatient glycemic control: Semglee  18 units BID, Novolog  0-15 units TID with meals,  Novolog  6 units TID with meals, Tradjenta  5 mg daily   Inpatient Diabetes Program Recommendations:     Insulin : Please consider decreasing Semglee  to 14 units BID and meal coverage to Novolog  4 units TID with meals.  Thanks, Earnie Gainer, RN, MSN, CDCES Diabetes Coordinator Inpatient Diabetes Program [phone] (Team Pager from 8am to 5pm)

## 2024-05-12 NOTE — Progress Notes (Signed)
 NAME:  Eugene [name], MRN:  [id number], DOB:  Oct 29, 1960, LOS: 9 ADMISSION DATE:  05/02/2024, CHIEF COMPLAINT:  sepsis   History of Present Illness:   63 yo M presenting to [facility] ED on 05/02/24 for evaluation of shortness and facial swelling.   History provided per chart review and patient bedside report. Patient presents with complaints of shortness of breath and facial swelling. Patient reported feeling short of breath all day on 8/9, for unknown reason. Patient also reports chronic back pain and abdominal pain with correlating vomiting since 04/26/24. Denies rashes, paresthesias, no focal weakness, no fevers/chills, denied tongue swelling. Recent discharge 7/22 after treatment for constipation/ ileus/ gastroparesis, AKI & UTI as well as pneumonia.   EMS reported hypoxia with SpO2 in the 70's on room air, placed on 6 L [place] with improvement to 92%. They also administered 25 mg of Benadryl  for the facial swelling. ED course: Upon arrival patient alert and responsive with noticeable periorbital swelling. Patient was treated with epinephrine  IM, benadryl , Pepcid , albuterol  and steroids. Labs significant for mild AKI on CKD and imaging re-demonstrated known bilateral pleural effusions. He had interval improvement in facial swelling but still requiring increased O2 support of 5 L [place]. TRH consulted for admission. Initial Vitals: 99.1, 20, 74, 122/82 & 93% on 6 L [place]   Hospital Course: See significant events for details and timeline.  CT head wo contrast 05/11/24: no acute intracranial abnormality CT chest/abdomen/pelvis wo contrast 05/11/24: Interval development of debris within the right mainstem bronchi measuring up to 1.3 cm. Bilateral lower lobe consolidations with limited evaluation on this noncontrast study. Finding may represent infection/inflammation versus atelectasis. Re-demonstration of bilateral small, left greater than right, pleural effusions with pleural thickening. No acute intra-abdominal or  intrapelvic abnormality. Limited evaluation due to noncontrast study.  Aortic Atherosclerosis    PCCM consulted for assistance in management and monitoring due to SIRS and possible sepsis.  Pertinent  Medical History  Depression & Anxiety Dyslipidemia Hepatitis C Spinal stenosis T2DM IBS Osteomyelitis Status post bilateral BKA Neurogenic bladder with chronic suprapubic catheter Bowel obstruction Recurrent fecal impaction  Significant Hospital Events: Including procedures, antibiotic start and stop dates in addition to other pertinent events   05/02/24: Admit to PCU by TRH for monitoring of suspected anaphylaxis 05/04/24: He complains of low oxygen saturation and feels a little short of breath. He said he was not aware that his face was swollen but the nursing staff at the nursing home reported that he had a swollen face and were concerned about an allergic reaction. Nephrology consulted. 05/05/24: Overnight patient had shortness of breath.  Chest x-ray was done, Lasix  IV one-time dose was given.  Patient was placed on BiPAP for as needed. In the morning time patient was still complaining that he cannot breathe, no chest pain.  Patient was having some abdominal discomfort. Management plan discussed with patient. Thoracentesis performed. 05/06/24: patient with severe hyperglycemia today. 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 He is s/p solumedrol 40 BID and now on prednisone . His O2 req has improved from 7L/min to 5L/min. Pulmonology consulted. 05/07/24: patient has mild improvement in blood glucose measurements. His renal function is stable. His respiratory status is stable on 5l/min [place].  He is on prednisone , I have rduced dose to 30mg  po daily. He's on albumin  once daily.  Diagnostic left thoracentesis was performed showing no wbc/organissms noted. Today he is for repeat  CXR and  chest physiotherapy with recruitment maneuvers with metaneb TID.  05/08/24: patient still on 5L/min, RT states he is non compliant with metaneb. I have encouraged him to continue to work with us  to get his atelectatic lungs recruited and be able to wean oxygen. He is agreeable and shares he will try again. No changes in therapy from pulmonary today.  05/09/24:  patient reports severe leg pain which is chronic.  Patient is on 4L/min.  He is slow to improve with CKD/atelectasis  05/10/24: patient states leg pain is slightly better, he has phantom limb pain.  Patient is on 4L/min.  He is slow to improve with CKD/atelectasis. His renal function is better today.  K is elevated  05/11/24: Pulmonary therapy was discussed with respiratory therapist and learned he has been declining metaneb. He needs to have CPAP long term and we can set that up on outpatient basis. He will continue to develop atelectasis due to obesity, immobility and pulmonary congestion. PCCM consulted due to infection concerns and possible sepsis. Patient transferred to SDU for closer monitoring. 05/12/24: continues with pulmonary clearance, on HFNC, unable to wean down.  Interim History / Subjective:  Awake and alert today, follows commands. No complaints, not reporting shortness of breath or abdominal pain  Objective    Blood pressure (!) 89/59, pulse (!) 33, temperature 98.1 F (36.7 C), temperature source Axillary, resp. rate 13, height 5' (1.524 m), weight 136 kg, SpO2 92%.    FiO2 (%):  [36 %-70 %] 50 %   Intake/Output Summary (Last 24 hours) at 05/12/2024 1254 Last data filed at 05/12/2024 1126 Gross per 24 hour  Intake 1092.3 ml  Output 440 ml  Net 652.3 ml   Filed Weights   05/03/24 2039 05/07/24 0600  Weight: (!) 137.6 kg 136 kg    Examination:  Physical Exam Constitutional:      General: He is not in acute distress.    Appearance: He is obese. He is ill-appearing.  Cardiovascular:     Rate and Rhythm: Normal rate  and regular rhythm.     Pulses: Normal pulses.     Heart sounds: Normal heart sounds.  Pulmonary:     Breath sounds: Normal breath sounds.  Abdominal:     General: There is distension.     Tenderness: There is no abdominal tenderness.  Neurological:     General: No focal deficit present.     Mental Status: He is alert. Mental status is at baseline.      Assessment and Plan   63 year old male with history of T2DM, s/p bilateral BKA, neurogenic bladder with suprapubic catheter, and history of osteomyelitis who presented to the hospital with suspected anaphylaxis. Admitted for management of hypoxia, found to have bilateral lower lobe rounded atelectasis and associated chronic pleural effusions (transudative and negative for malignancy). He developed increased shortness of breath, fever, and chills yesterday prompting transfer to the ICU for closer monitoring.   #Acute Hypoxic Respiratory Failure #Sepsis (Urinary vs Pulmonary source) #Bilateral Pleural Effusions #AKI on CKD  #Nephrotic Syndrome #Hyperkalemia #Fecal Impaction #T2DM   Neuro - awake and alert. Continued on home pain meds (oxycodone , tylenol ), as well as gabapentin    CV - while he meets SIRS criteria and has sepsis given suspected source of infection, he has no signs of hypoperfusion with normal blood pressure and normal lactic acid. He had received IV diuresis with 80 furosemide  bid which we will hold (see renal below). He's continued on home metoprolol .  Most recent TTE from 03/2024 showed normal EF. Hydralazine  PRN ordered.   Pulm - Increased work of breathing and oxygen requirements, with findings of bilateral pleural effusions and rounded atelectasis/consolidation bilaterally. The effusions have a split pleura sign suggesting chronicity. Effusions were recently sampled and showed a transudate, again consistent with chronicity. The rounded atelectasis with associated debris in the airway could suggest a pneumonia, or simply  inability to clear secretions due to body habitus. We've initiated him on an aggressive pulmonary clearance regimen (nebulized 3% saline, nebulized albuterol , flutter, IS, mechanical) and will also initiate broad spectrum antibiotics. Continued on HFNC and taper as tolerated.  -continue HFNC, wean as tolerated  -aggressive pulmonary clearance regimen   GI - diet liberalized. CT abdomen with fecal impaction, started on aggressive bowel regimen   Renal - AKI on CKD, holding diuresis given he is overall net negative 15 liters and has worsening kidney function. Hyperkalemia treated with shifting measures and K binders. Appreciate input from nephrology.  -closely monitor K  -hold diuresis   Endo - He is on long acting insulin  as well as pre-meals that we are adjusting down today given lower sugars and kidney dysfunction. Also continued on linagliptin    Hem/Onc - on heparin  subQ for DVT prophylaxis   ID - initiated broad spectrum antibiotics for management of HAP. Attempting to obtain respiratory cultures. Urinalysis was also suggestive of infection, sent for culture. Blood cultures similarly sent and pending.  -continue Zosyn   -continue linezolid   Best Practice (right click and Reselect all SmartList Selections daily)   Diet/type: Regular consistency (see orders) DVT prophylaxis prophylactic heparin   Pressure ulcer(s): N/A GI prophylaxis: H2B Lines: N/A Foley:  N/A; supra-pubic in place Code Status:  DNR Last date of multidisciplinary goals of care discussion [05/12/2024]  Labs   CBC: Recent Labs  Lab 05/09/24 0518 05/10/24 0536 05/11/24 0635 05/11/24 2240 05/12/24 0558  WBC 8.0 8.4 7.4 6.6 8.8  HGB 9.2* 9.7* 10.2* 9.4* 8.4*  HCT 29.1* 30.5* 32.1* 30.1* 27.0*  MCV 92.1 91.3 93.6 93.2 93.8  PLT 195 201 158 137* 125*    Basic Metabolic Panel: Recent Labs  Lab 05/08/24 0520 05/09/24 0518 05/10/24 0536 05/11/24 0635 05/11/24 2132 05/12/24 0558  NA 138 137 136 136 135 139   K 5.1 4.8 5.3* 5.8* 5.5* 5.7*  CL 105 105 104 102 101 104  CO2 26 24 23 24 24 24   GLUCOSE 240* 244* 257* 145* 80 78  BUN 75* 75* 79* 77* 82* 80*  CREATININE 4.39* 4.00* 3.94* 3.91* 4.37* 4.53*  CALCIUM  7.5* 7.1* 7.2* 7.2* 7.4* 7.0*  MG 2.1 1.9 2.0 1.7 1.7 1.7  PHOS 5.5* 4.6 4.5 3.9  --  4.6   GFR: Estimated Creatinine Clearance: 20.4 mL/min (A) (by C-G formula based on SCr of 4.53 mg/dL (H)). Recent Labs  Lab 05/10/24 0536 05/11/24 0635 05/11/24 2132 05/11/24 2240 05/12/24 0047 05/12/24 0558  WBC 8.4 7.4  --  6.6  --  8.8  LATICACIDVEN  --   --  0.8  --  1.0  --     Liver Function Tests: Recent Labs  Lab 05/06/24 0523 05/07/24 0653 05/08/24 0520  AST 14* 19 16  ALT 13 15 17   ALKPHOS 41 41 48  BILITOT 0.3 0.3 0.7  PROT 5.5* 5.3* 5.7*  ALBUMIN  2.0* 2.0* 2.1*   Recent Labs  Lab 05/06/24 1817  LIPASE 25   No results for input(s): AMMONIA in the last 168 hours.  ABG  Component Value Date/Time   HCO3 26.6 05/11/2024 2132   ACIDBASEDEF 1.6 04/19/2024 0606   O2SAT PENDING 05/11/2024 2132     Coagulation Profile: No results for input(s): INR, PROTIME in the last 168 hours.  Cardiac Enzymes: No results for input(s): CKTOTAL, CKMB, CKMBINDEX, TROPONINI in the last 168 hours.  HbA1C: Hgb A1c MFr Bld  Date/Time Value Ref Range Status  04/14/2024 06:43 AM 6.0 (H) 4.8 - 5.6 % Final    Comment:    (NOTE) Diagnosis of Diabetes The following HbA1c ranges recommended by the American Diabetes Association (ADA) may be used as an aid in the diagnosis of diabetes mellitus.  Hemoglobin             Suggested A1C NGSP%              Diagnosis  <5.7                   Non Diabetic  5.7-6.4                Pre-Diabetic  >6.4                   Diabetic  <7.0                   Glycemic control for                       adults with diabetes.    04/17/2021 07:29 AM 7.9 (H) 4.8 - 5.6 % Final    Comment:    (NOTE)         Prediabetes: 5.7 - 6.4          Diabetes: >6.4         Glycemic control for adults with diabetes: <7.0     CBG: Recent Labs  Lab 05/11/24 2102 05/11/24 2121 05/12/24 0347 05/12/24 0801 05/12/24 1110  GLUCAP 64* 75 93 83 145*    Review of Systems:   N/A  Past Medical History:  He,  has a past medical history of Anxiety, Depression, Diabetes mellitus without complication (HCC), Hepatitis C, Hyperlipidemia, IBS (irritable bowel syndrome), Osteomyelitis (HCC), and Spinal stenosis.   Surgical History:   Past Surgical History:  Procedure Laterality Date   BACK SURGERY     bilateral amputation Bilateral    CENTRAL LINE INSERTION-TUNNELED N/A 02/02/2019   Procedure: CENTRAL LINE INSERTION-TUNNELED;  Surgeon: Marea Selinda RAMAN, MD;  Location: [facility] INVASIVE CV LAB;  Service: Cardiovascular;  Laterality: N/A;   COLONOSCOPY WITH PROPOFOL  N/A 01/12/2020   Procedure: COLONOSCOPY WITH PROPOFOL ;  Surgeon: Jinny Carmine, MD;  Location: [facility] ENDOSCOPY;  Service: Endoscopy;  Laterality: N/A;   ESOPHAGOGASTRODUODENOSCOPY (EGD) WITH PROPOFOL  N/A 12/07/2019   Procedure: ESOPHAGOGASTRODUODENOSCOPY (EGD) WITH PROPOFOL ;  Surgeon: Unk Corinn Skiff, MD;  Location: [facility] ENDOSCOPY;  Service: Gastroenterology;  Laterality: N/A;   IR CATHETER TUBE CHANGE  12/04/2019   SPINAL FUSION     THORACIC SPINE SURGERY  01/2019   extensive washout     Social History:   reports that he has been smoking cigarettes. He has never used smokeless tobacco. He reports that he does not currently use alcohol. He reports that he does not currently use drugs.   Family History:  His family history is not on file.   Allergies No Known Allergies   Home Medications  Prior to Admission medications   Medication Sig Start Date End Date Taking? Authorizing Provider  acetaminophen  (TYLENOL ) 325 MG tablet  Take 650 mg by mouth in the morning and at bedtime.   Yes [provider]  bisacodyl  (DULCOLAX) 10 MG suppository Place 10 mg rectally daily as needed  for moderate constipation.   Yes [provider]  Doxepin  HCl 6 MG TABS Take 6 mg by mouth at bedtime.   Yes [provider]  furosemide  (LASIX ) 40 MG tablet Take 1 tablet (40 mg total) by mouth daily. 04/25/21  Yes Patel, Sona, MD  gabapentin  (NEURONTIN ) 300 MG capsule Take 300 mg by mouth at bedtime.   Yes [provider]  hydrocortisone  cream 1 % Apply 1 Application topically daily.   Yes [provider]  insulin  glargine-yfgn (SEMGLEE ) 100 UNIT/ML injection Inject 0.1 mLs (10 Units total) into the skin 2 (two) times daily. 05/01/24  Yes Sreenath, Sudheer B, MD  insulin  lispro (HUMALOG ) 100 UNIT/ML injection Inject 0-15 Units into the skin 4 (four) times daily -  before meals and at bedtime. <150= 0 units 151-200= 3 units 201-250= 6 units 251-300= 9 units 301-350= 12 units 351-400= 15 units >400 call MD Patient taking differently: Inject 0-15 Units into the skin 4 (four) times daily -  before meals and at bedtime. <150= 0 units 151-200= 3 units 201-250= 6 units 251-300= 9 units 301-350= 12 units 351-400= 15 units >400 call MD   Yes [provider]  lactulose  (CEPHULAC ) 20 g packet Take 20 g by mouth 2 (two) times daily.   Yes [provider]  levothyroxine  (SYNTHROID ) 175 MCG tablet Take 175 mcg by mouth at bedtime.   Yes [provider]  linaclotide  (LINZESS ) 145 MCG CAPS capsule Take 1 capsule (145 mcg total) by mouth daily before breakfast. 10/25/19  Yes Danford, Lonni SQUIBB, MD  Melatonin 5 MG TABS Take 10 mg by mouth at bedtime.   Yes [provider]  metoCLOPramide  (REGLAN ) 10 MG tablet Take 0.5 tablets (5 mg total) by mouth 3 (three) times daily before meals. 05/01/24  Yes Sreenath, Sudheer B, MD  metoprolol  succinate (TOPROL -XL) 25 MG 24 hr tablet Take 25 mg by mouth daily.   Yes [provider]  ondansetron  (ZOFRAN ) 4 MG tablet Take 4 mg by mouth daily.   Yes [provider]  polyethylene  glycol (MIRALAX  / GLYCOLAX ) 17 g packet Take 17 g by mouth 2 (two) times daily. 10/24/19  Yes Danford, Lonni SQUIBB, MD  senna-docusate (SENOKOT-S) 8.6-50 MG tablet Take 1 tablet by mouth 2 (two) times daily. 10/24/19  Yes Danford, Lonni SQUIBB, MD  simethicone  (MYLICON) 80 MG chewable tablet Chew 1 tablet (80 mg total) by mouth 4 (four) times daily. 05/01/24  Yes Sreenath, Sudheer B, MD  sitaGLIPtin (JANUVIA) 100 MG tablet Take 100 mg by mouth daily.   Yes [provider]  sodium phosphate  Pediatric (FLEET) 3.5-9.5 GM/59ML enema Place 1 enema rectally every other day as needed for severe constipation.   Yes [provider]  pregabalin  (LYRICA ) 75 MG capsule Take 75 mg by mouth 2 (two) times daily. Patient not taking: Reported on 04/17/2021  04/20/21  [provider]     The patient is critically ill due to sepsis, AKI.  Critical care was necessary to treat or prevent imminent or life-threatening deterioration. Critical care time was spent by me on the following activities: development of a treatment plan with the patient and/or surrogate as well as nursing, discussions with consultants, evaluation of the patient's response to treatment, examination of the patient, obtaining a history from the patient or  surrogate, ordering and performing treatments and interventions, ordering and review of laboratory studies, ordering and review of radiographic studies, review of telemetry data including pulse oximetry, re-evaluation of patient's condition and participation in multidisciplinary rounds.   I personally spent 40 minutes providing critical care not including any separately billable procedures.   Belva November, MD [facility] 05/12/2024 1:02 PM

## 2024-05-12 NOTE — Progress Notes (Addendum)
 [facility]  ROUNDING NOTE   Subjective:   Eugene [name]  is a 63 y.o.  male  with past medical conditions including diabetes, bilateral BKA, suprapubic catheter, and hepatitis C. Patient [facility] ED from [facility] with facial edema and has been admitted for Wheezing [R06.2] Anaphylaxis [T78.2XXA] Facial swelling [R22.0] AKI (acute kidney injury) (HCC) [N17.9] Acute on chronic respiratory failure with hypoxia (HCC) [J96.21]  Patient is known to our practice from recent admission. He was released and pending follow up appt in our office at discharge.  Update  Patient seen sitting up in bed Alert and oriented, irritable HHFNC Soft BP Febrile overnight  Labs pending  Objective:  Vital signs in last 24 hours:  Temp:  [98.1 F (36.7 C)-103.1 F (39.5 C)] 98.1 F (36.7 C) (08/19 0800) Pulse Rate:  [35-146] 72 (08/19 1000) Resp:  [0-30] 19 (08/19 1000) BP: (78-182)/(46-122) 98/82 (08/19 1000) SpO2:  [84 %-100 %] 100 % (08/19 1000) FiO2 (%):  [36 %-70 %] 70 % (08/19 0842)  Weight change:  Filed Weights   05/03/24 2039 05/07/24 0600  Weight: (!) 137.6 kg 136 kg    Intake/Output: I/O last 3 completed shifts: In: 852.3 [P.O.:720; IV Piggyback:132.3] Out: 1740 [Urine:1740]   Intake/Output this shift:  Total I/O In: 400 [P.O.:400] Out: -   Physical Exam: General: NAD  Head: Normocephalic  Eyes: Anicteric  Lungs:  Diminished, HHFNC  Heart: Regular rate   Abdomen:  Distended and firm, obese  Extremities: Trace peripheral edema.,  Bilateral BKA  Neurologic: Awake and alert  Skin: No rashes  Access: None  Suprapubic catheter in place  Basic Metabolic Panel: Recent Labs  Lab 05/07/24 0653 05/08/24 0520 05/09/24 0518 05/10/24 0536 05/11/24 0635 05/11/24 2132  NA 136 138 137 136 136 135  K 4.9 5.1 4.8 5.3* 5.8* 5.5*  CL 102 105 105 104 102 101  CO2 26 26 24 23 24 24   GLUCOSE 285* 240* 244* 257* 145* 80  BUN 72* 75* 75* 79* 77* 82*  CREATININE  4.34* 4.39* 4.00* 3.94* 3.91* 4.37*  CALCIUM  7.4* 7.5* 7.1* 7.2* 7.2* 7.4*  MG 2.0 2.1 1.9 2.0 1.7 1.7  PHOS 5.3* 5.5* 4.6 4.5 3.9  --     Liver Function Tests: Recent Labs  Lab 05/06/24 0523 05/07/24 0653 05/08/24 0520  AST 14* 19 16  ALT 13 15 17   ALKPHOS 41 41 48  BILITOT 0.3 0.3 0.7  PROT 5.5* 5.3* 5.7*  ALBUMIN  2.0* 2.0* 2.1*   Recent Labs  Lab 05/06/24 1817  LIPASE 25   No results for input(s): AMMONIA in the last 168 hours.  CBC: Recent Labs  Lab 05/09/24 0518 05/10/24 0536 05/11/24 0635 05/11/24 2240 05/12/24 0558  WBC 8.0 8.4 7.4 6.6 8.8  HGB 9.2* 9.7* 10.2* 9.4* 8.4*  HCT 29.1* 30.5* 32.1* 30.1* 27.0*  MCV 92.1 91.3 93.6 93.2 93.8  PLT 195 201 158 137* 125*    Cardiac Enzymes: No results for input(s): CKTOTAL, CKMB, CKMBINDEX, TROPONINI in the last 168 hours.  BNP: Invalid input(s): POCBNP  CBG: Recent Labs  Lab 05/11/24 2008 05/11/24 2102 05/11/24 2121 05/12/24 0347 05/12/24 0801  GLUCAP 84 64* 75 93 83    Microbiology: Results for orders placed or performed during the hospital encounter of 05/02/24  Resp panel by RT-PCR (RSV, Flu A&B, Covid) Anterior Nasal Swab     Status: None   Collection Time: 05/02/24  8:06 PM   Specimen: Anterior Nasal Swab  Result Value Ref  Range Status   SARS Coronavirus 2 by RT PCR NEGATIVE NEGATIVE Final    Comment: (NOTE) SARS-CoV-2 target nucleic acids are NOT DETECTED.  The SARS-CoV-2 RNA is generally detectable in upper respiratory specimens during the acute phase of infection. The lowest concentration of SARS-CoV-2 viral copies this assay can detect is 138 copies/mL. A negative result does not preclude SARS-Cov-2 infection and should not be used as the sole basis for treatment or other patient management decisions. A negative result may occur with  improper specimen collection/handling, submission of specimen other than nasopharyngeal swab, presence of viral mutation(s) within the areas  targeted by this assay, and inadequate number of viral copies(<138 copies/mL). A negative result must be combined with clinical observations, patient history, and epidemiological information. The expected result is Negative.  Fact Sheet for Patients:  BloggerCourse.com  Fact Sheet for Healthcare Providers:  SeriousBroker.it  This test is no t yet approved or cleared by the [place]  FDA and  has been authorized for detection and/or diagnosis of SARS-CoV-2 by FDA under an Emergency Use Authorization (EUA). This EUA will remain  in effect (meaning this test can be used) for the duration of the COVID-19 declaration under Section 564(b)(1) of the Act, 21 U.S.C.section 360bbb-3(b)(1), unless the authorization is terminated  or revoked sooner.       Influenza A by PCR NEGATIVE NEGATIVE Final   Influenza B by PCR NEGATIVE NEGATIVE Final    Comment: (NOTE) The Xpert Xpress SARS-CoV-2/FLU/RSV plus assay is intended as an aid in the diagnosis of influenza from Nasopharyngeal swab specimens and should not be used as a sole basis for treatment. Nasal washings and aspirates are unacceptable for Xpert Xpress SARS-CoV-2/FLU/RSV testing.  Fact Sheet for Patients: BloggerCourse.com  Fact Sheet for Healthcare Providers: SeriousBroker.it  This test is not yet approved or cleared by the [place]  FDA and has been authorized for detection and/or diagnosis of SARS-CoV-2 by FDA under an Emergency Use Authorization (EUA). This EUA will remain in effect (meaning this test can be used) for the duration of the COVID-19 declaration under Section 564(b)(1) of the Act, 21 U.S.C. section 360bbb-3(b)(1), unless the authorization is terminated or revoked.     Resp Syncytial Virus by PCR NEGATIVE NEGATIVE Final    Comment: (NOTE) Fact Sheet for  Patients: BloggerCourse.com  Fact Sheet for Healthcare Providers: SeriousBroker.it  This test is not yet approved or cleared by the [place]  FDA and has been authorized for detection and/or diagnosis of SARS-CoV-2 by FDA under an Emergency Use Authorization (EUA). This EUA will remain in effect (meaning this test can be used) for the duration of the COVID-19 declaration under Section 564(b)(1) of the Act, 21 U.S.C. section 360bbb-3(b)(1), unless the authorization is terminated or revoked.  Performed at [facility], [address] [place], [place] [address]   Body fluid culture w Gram Stain     Status: None   Collection Time: 05/05/24  4:10 PM   Specimen: PATH Cytology Pleural fluid  Result Value Ref Range Status   Specimen Description   Final    FLUID Performed at [facility], [address], [place], [place] [address]    Special Requests   Final    CYTO PLEU Performed at [facility], [address] [place], [place] [address]    Gram Stain NO WBC SEEN NO ORGANISMS SEEN CYTOSPIN SMEAR   Final   Culture   Final    NO GROWTH 3 DAYS Performed at [facility], [address] [place], [place] [address]    Report Status 05/09/2024 FINAL  Final  MRSA Next Gen by PCR, Nasal     Status: Abnormal   Collection Time: 05/11/24  9:08 PM   Specimen: Nasal Mucosa; Nasal Swab  Result Value Ref Range Status   MRSA by PCR Next Gen DETECTED (A) NOT DETECTED Final    Comment: RESULT CALLED TO, READ BACK BY AND VERIFIED WITH:  HOLLY CHANDLER AT 0007 05/12/24 JG (NOTE) The GeneXpert MRSA Assay (FDA approved for NASAL specimens only), is one component of a comprehensive MRSA colonization surveillance program. It is not intended to diagnose MRSA infection nor to guide or monitor treatment for MRSA infections. Test performance is not FDA approved in patients less than 28 years old. Performed at  [facility], [address] [place], [place] [address]   Culture, blood (Routine X 2) w Reflex to ID Panel     Status: None (Preliminary result)   Collection Time: 05/11/24  9:32 PM   Specimen: BLOOD  Result Value Ref Range Status   Specimen Description BLOOD BLOOD RIGHT HAND  Final   Special Requests   Final    BOTTLES DRAWN AEROBIC AND ANAEROBIC Blood Culture results may not be optimal due to an inadequate volume of blood received in culture bottles   Culture   Final    NO GROWTH < 12 HOURS Performed at [facility], [address], [place], [place] [address]    Report Status PENDING  Incomplete  Culture, blood (Routine X 2) w Reflex to ID Panel     Status: None (Preliminary result)   Collection Time: 05/11/24  9:32 PM   Specimen: BLOOD  Result Value Ref Range Status   Specimen Description BLOOD BLOOD RIGHT ARM  Final   Special Requests   Final    BOTTLES DRAWN AEROBIC AND ANAEROBIC Blood Culture adequate volume   Culture   Final    NO GROWTH < 12 HOURS Performed at [facility], [address] [place], [place] [address]    Report Status PENDING  Incomplete    Coagulation Studies: No results for input(s): LABPROT, INR in the last 72 hours.  Urinalysis: Recent Labs    05/11/24 1630  COLORURINE YELLOW*  LABSPEC 1.018  PHURINE 5.0  GLUCOSEU 50*  HGBUR MODERATE*  BILIRUBINUR NEGATIVE  KETONESUR NEGATIVE  PROTEINUR >=300*  NITRITE NEGATIVE  LEUKOCYTESUR LARGE*      Imaging: CT CHEST ABDOMEN PELVIS WO CONTRAST Result Date: 05/11/2024 CLINICAL DATA:  Sepsis Shortness of Breath; Facial Swelling Mental status change, unknown cause Best possible images due to pt condition EXAM: CT CHEST, ABDOMEN AND PELVIS WITHOUT CONTRAST TECHNIQUE: Multidetector CT imaging of the chest, abdomen and pelvis was performed following the standard protocol without IV contrast. RADIATION DOSE REDUCTION: This exam was performed according to the departmental  dose-optimization program which includes automated exposure control, adjustment of the mA and/or kV according to patient size and/or use of iterative reconstruction technique. COMPARISON:  CT abdomen pelvis 05/05/2024, CT chest 05/05/2024 FINDINGS: CT CHEST FINDINGS Cardiovascular: Normal heart size. No significant pericardial effusion. The thoracic aorta is normal in caliber. Mild atherosclerotic plaque of the thoracic aorta. Left anterior descending coronary artery calcifications. Mediastinum/Nodes: No gross hilar adenopathy, noting limited sensitivity for the detection of hilar adenopathy on this noncontrast study. no enlarged mediastinal or axillary lymph nodes. Thyroid gland and esophagus demonstrate no significant findings. Interval development of debris/mucous within the right mainstem bronchi measuring up  to 1.3 cm. Lungs/Pleura: Limited evaluation due to noncontrast study and motion artifact. Bilateral lower lobes consolidations. No pulmonary nodule. No pulmonary mass. Redemonstration of bilateral small, left greater than right, pleural effusions with pleural thickening. Pleural effusion. No pneumothorax. Musculoskeletal: Bilateral gynecomastia. No suspicious lytic or blastic osseous lesions. No acute displaced fracture. Degenerative changes of the cervical spine at the C5-C6 level. CT ABDOMEN PELVIS FINDINGS Hepatobiliary: No focal liver abnormality. No gallstones, gallbladder wall thickening, or pericholecystic fluid. No biliary dilatation. Pancreas: Diffusely atrophic. No focal lesion. Otherwise normal pancreatic contour. No surrounding inflammatory changes. No main pancreatic ductal dilatation. Spleen: Normal in size without focal abnormality. Adrenals/Urinary Tract: No adrenal nodule bilaterally. No nephrolithiasis and no hydronephrosis. No definite contour-deforming renal mass. No ureterolithiasis or hydroureter. The urinary bladder is decompressed with suprapubic catheter terminating within the  lumen. Stomach/Bowel: Stomach is within normal limits. No evidence of bowel wall thickening or dilatation. Stool within the rectosigmoid colon. Appendix appears normal. Vascular/Lymphatic: No abdominal aorta or iliac aneurysm. Moderate atherosclerotic plaque of the aorta and its branches. No abdominal, pelvic, or inguinal lymphadenopathy. Reproductive: Prostate is unremarkable. Other: No intraperitoneal free fluid. No intraperitoneal free gas. No organized fluid collection. Musculoskeletal: At least small fat containing umbilical hernia with an abdominal defect of 2.7 cm. Subcutaneus soft tissue edema. No suspicious lytic or blastic osseous lesions. No acute displaced fracture. Stable chronic T12 and L1 compression fractures. Thoracolumbar posterolateral surgical fusion surgical hardware. T8 and T9 corpectomy. IMPRESSION: 1. Interval development of debris within the right mainstem bronchi measuring up to 1.3 cm. 2. Bilateral lower lobe consolidations with limited evaluation on this noncontrast study. Finding may represent infection/inflammation versus atelectasis. 3. Redemonstration of bilateral small, left greater than right, pleural effusions with pleural thickening. 4. No acute intra-abdominal or intrapelvic abnormality. 5. Limited evaluation due to noncontrast study. 6.  Aortic Atherosclerosis (ICD10-I70.0). Electronically Signed   By: Morgane  Naveau M.D.   On: 05/11/2024 21:21   CT HEAD WO CONTRAST ( ) Result Date: 05/11/2024 CLINICAL DATA:  Mental status change, unknown cause EXAM: CT HEAD WITHOUT CONTRAST TECHNIQUE: Contiguous axial images were obtained from the base of the skull through the vertex without intravenous contrast. RADIATION DOSE REDUCTION: This exam was performed according to the departmental dose-optimization program which includes automated exposure control, adjustment of the mA and/or kV according to patient size and/or use of iterative reconstruction technique. COMPARISON:  None  Available. FINDINGS: Brain: No evidence of acute infarction, hemorrhage, hydrocephalus, extra-axial collection or mass lesion/mass effect. Vascular: No hyperdense vessel. Skull: No acute fracture. Sinuses/Orbits: Clear sinuses.  No acute orbital findings. Other: No mastoid effusions. IMPRESSION: No evidence of acute intracranial abnormality. Electronically Signed   By: Gilmore GORMAN Molt M.D.   On: 05/11/2024 21:17   DG Chest Port 1 View Result Date: 05/11/2024 CLINICAL DATA:  Pleural effusion EXAM: PORTABLE CHEST 1 VIEW COMPARISON:  05/08/2019 FINDINGS: Cardiac shadow is stable. Postsurgical change in the thoracic spine is noted. Left-sided pleural effusion is noted. No sizable right effusion is noted. Mild right basilar atelectasis is seen. IMPRESSION: Left-sided pleural effusion. Right basilar atelectasis. Electronically Signed   By: Oneil Devonshire M.D.   On: 05/11/2024 10:38     Medications:    linezolid  (ZYVOX ) IV     piperacillin -tazobactam (ZOSYN )  IV 12.5 mL/hr at 05/12/24 0600      arformoterol   15 mcg Nebulization BID   bisacodyl   10 mg Oral QHS   bisacodyl   10 mg Rectal Daily   [START ON 05/13/2024] Chlorhexidine  Gluconate  Cloth  6 each Topical QHS   vitamin B-12  1,000 mcg Oral Daily   famotidine   10 mg Oral Daily   feeding supplement (GLUCERNA SHAKE)  237 mL Oral TID BM   folic acid   1 mg Oral Daily   gabapentin   300 mg Oral QHS   heparin  injection (subcutaneous)  5,000 Units Subcutaneous Q8H   insulin  aspart  0-15 Units Subcutaneous TID WC   insulin  aspart  4 Units Subcutaneous TID WC   insulin  glargine-yfgn  14 Units Subcutaneous BID   ipratropium-albuterol   3 mL Nebulization BID   levothyroxine   175 mcg Oral Q0600   linaclotide   145 mcg Oral QAC breakfast   linagliptin   5 mg Oral Daily   melatonin  10 mg Oral QHS   metoCLOPramide   5 mg Oral TID AC   metoprolol  succinate  25 mg Oral Daily   mineral oil  1 enema Rectal Once   polyethylene glycol  17 g Oral BID   sodium  chloride HYPERTONIC  4 mL Nebulization BID   sodium zirconium cyclosilicate   10 g Oral Daily   Vitamin D  (Ergocalciferol )  50,000 Units Oral Q7 days   acetaminophen  **OR** acetaminophen , hydrALAZINE  **OR** hydrALAZINE , metoprolol  tartrate, ondansetron  **OR** ondansetron  (ZOFRAN ) IV, oxyCODONE , traZODone   Assessment/ Plan:  Eugene [name] is a 63 y.o.  male  with past medical conditions including diabetes, bilateral BKA, suprapubic catheter, hepatitis C, who was admitted to [facility] on 04/14/2024 for Suprapubic catheter (HCC) [Z93.59] AKI (acute kidney injury) (HCC) [N17.9] Acute kidney injury (HCC) [N17.9]    Acute kidney injury with proteinuria chronic kidney disease stage IV, creatinine 3.74 at time of previous discharge.  Creatinine 4.19 on admission.  Etiology of AKI unclear at this time.  Concerning for progression of kidney.   Awaiting updated labs. Creatinine may increase due to decreased blood pressure. Will order gentle IV hydration for 1 day. Will continue to monitor.    Lab Results  Component Value Date   CREATININE 4.37 (H) 05/11/2024   CREATININE 3.91 (H) 05/11/2024   CREATININE 3.94 (H) 05/10/2024     Intake/Output Summary (Last 24 hours) at 05/12/2024 1038 Last data filed at 05/12/2024 0900 Gross per 24 hour  Intake 1072.3 ml  Output 440 ml  Net 632.3 ml       2.  Hypertension with chronic kidney disease.  Currently prescribed Hydralazine , furosemide  and metoprolol .  Blood pressure decreased today, 91/46.   3.  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 Urine protein to creatinine ratio of 15.4 via Foley specimen.  Proteinuria likely secondary to diabetic nephropathy.  Hemoglobin A1c of 6.0% from 04/14/2024.  Previously diabetes control was suboptimal.  Glucose well controlled  4. Anemia of chronic kidney disease Lab Results  Component Value Date   HGB 8.4 (L) 05/12/2024    Hemoglobin remains stable   LOS:  9 Eugene [name] 8/19/202510:38 AM

## 2024-05-12 NOTE — Progress Notes (Signed)
 Pt became angry and started to scream at RN as he was requesting more fluids. RN try to educate pt that he is on 1500 mL fluid restriction. Pt had coffee, cup of water and small cup of juice so far this morning.

## 2024-05-13 DIAGNOSIS — N049 Nephrotic syndrome with unspecified morphologic changes: Secondary | ICD-10-CM | POA: Diagnosis not present

## 2024-05-13 LAB — CBC
HCT: 26.3 % — ABNORMAL LOW (ref 39.0–52.0)
Hemoglobin: 8.2 g/dL — ABNORMAL LOW (ref 13.0–17.0)
MCH: 29.3 pg (ref 26.0–34.0)
MCHC: 31.2 g/dL (ref 30.0–36.0)
MCV: 93.9 fL (ref 80.0–100.0)
Platelets: 118 K/uL — ABNORMAL LOW (ref 150–400)
RBC: 2.8 MIL/uL — ABNORMAL LOW (ref 4.22–5.81)
RDW: 12.7 % (ref 11.5–15.5)
WBC: 4.6 K/uL (ref 4.0–10.5)
nRBC: 0 % (ref 0.0–0.2)

## 2024-05-13 LAB — BASIC METABOLIC PANEL WITH GFR
Anion gap: 8 (ref 5–15)
BUN: 83 mg/dL — ABNORMAL HIGH (ref 8–23)
CO2: 25 mmol/L (ref 22–32)
Calcium: 7 mg/dL — ABNORMAL LOW (ref 8.9–10.3)
Chloride: 103 mmol/L (ref 98–111)
Creatinine, Ser: 4.71 mg/dL — ABNORMAL HIGH (ref 0.61–1.24)
GFR, Estimated: 13 mL/min — ABNORMAL LOW (ref >60)
Glucose, Bld: 151 mg/dL — ABNORMAL HIGH (ref 70–99)
Potassium: 5.4 mmol/L — ABNORMAL HIGH (ref 3.5–5.1)
Sodium: 136 mmol/L (ref 135–145)

## 2024-05-13 LAB — BLOOD GAS, VENOUS
Bicarbonate: 26.6 mmol/L (ref 20.0–28.0)
Patient temperature: 37 mmol/L (ref 0.0–2.0)
pCO2, Ven: 47 mmHg (ref 44–60)
pH, Ven: 7.36 (ref 7.25–7.43)
pO2, Ven: 26.6 mmol/L (ref 32–45)

## 2024-05-13 LAB — STREP PNEUMONIAE URINARY ANTIGEN: Strep Pneumo Urinary Antigen: NEGATIVE

## 2024-05-13 LAB — GLUCOSE, CAPILLARY
Glucose-Capillary: 101 mg/dL — ABNORMAL HIGH (ref 70–99)
Glucose-Capillary: 136 mg/dL — ABNORMAL HIGH (ref 70–99)
Glucose-Capillary: 101 mg/dL — ABNORMAL HIGH (ref 70–99)
Glucose-Capillary: 85 mg/dL (ref 70–99)

## 2024-05-13 LAB — CALCIUM, IONIZED: Calcium, Ionized, Serum: 4.5 mg/dL (ref 4.5–5.6)

## 2024-05-13 MED ORDER — PIPERACILLIN-TAZOBACTAM IN DEX 2-0.25 GM/50ML IV SOLN
2.2500 g | Freq: Three times a day (TID) | INTRAVENOUS | Status: DC
  Administered 2024-05-13 – 2024-05-14 (×3): 2.25 g via INTRAVENOUS
  Filled 2024-05-13 (×5): qty 50

## 2024-05-13 MED ORDER — SODIUM ZIRCONIUM CYCLOSILICATE 5 G PO PACK
10.0000 g | PACK | Freq: Once | ORAL | Status: AC
  Administered 2024-05-13: 10 g via ORAL
  Filled 2024-05-13: qty 2

## 2024-05-13 MED ORDER — INSULIN GLARGINE 100 UNIT/ML ~~LOC~~ SOLN
14.0000 [IU] | Freq: Two times a day (BID) | SUBCUTANEOUS | Status: DC
  Administered 2024-05-13 – 2024-05-18 (×9): 14 [IU] via SUBCUTANEOUS
  Filled 2024-05-13 (×12): qty 0.14

## 2024-05-13 NOTE — Progress Notes (Signed)
 [facility]  ROUNDING NOTE   Subjective:   Eugene [name]  is a 63 y.o.  male  with past medical conditions including diabetes, bilateral BKA, suprapubic catheter, and hepatitis C. Patient [facility] ED from [facility] with facial edema and has been admitted for Wheezing [R06.2] Anaphylaxis [T78.2XXA] Facial swelling [R22.0] AKI (acute kidney injury) (HCC) [N17.9] Acute on chronic respiratory failure with hypoxia (HCC) [J96.21]  Patient is known to our practice from recent admission. He was released and pending follow up appt in our office at discharge.  Update: 8:10 AM creatinine remains high at at 4.71.  Good urine output of 1.8 L.  States that he needs to be moved up in the bed this a.m.  He was 08/19 0701 - 08/20 0700 In: 2011.8 [P.O.:714; I.V.:615.1; IV Piggyback:682.7] Out: 1830 [Urine:1830]  Lab Results  Component Value Date   CREATININE 4.71 (H) 05/13/2024   CREATININE 4.53 (H) 05/12/2024   CREATININE 4.37 (H) 05/11/2024     Objective:  Vital signs in last 24 hours:  Temp:  [98.3 F (36.8 C)-98.9 F (37.2 C)] 98.9 F (37.2 C) (08/20 0400) Pulse Rate:  [33-93] 75 (08/20 0300) Resp:  [6-118] 14 (08/20 0100) BP: (76-130)/(41-82) 104/41 (08/20 0410) SpO2:  [87 %-100 %] 95 % (08/20 0500) FiO2 (%):  [50 %-70 %] 50 % (08/19 1318) Weight:  [131.8 kg] 131.8 kg (08/20 0600)  Weight change:  Filed Weights   05/03/24 2039 05/07/24 0600 05/13/24 0600  Weight: (!) 137.6 kg 136 kg 131.8 kg    Intake/Output: I/O last 3 completed shifts: In: 2144.1 [P.O.:714; I.V.:615.1; IV Piggyback:815] Out: 2270 [Urine:2270]   Intake/Output this shift:  No intake/output data recorded.  Physical Exam: General: NAD  Head: Normocephalic  Eyes: Anicteric  Lungs:  Diminished, HHFNC  Heart: Regular rate   Abdomen:  Distended and firm, obese  Extremities: Trace peripheral edema.,  Bilateral BKA  Neurologic: Awake and alert  Skin: No rashes  Access: None  Suprapubic catheter  in place  Basic Metabolic Panel: Recent Labs  Lab 05/08/24 0520 05/09/24 0518 05/10/24 0536 05/11/24 0635 05/11/24 2132 05/12/24 0558 05/13/24 0404  NA 138 137 136 136 135 139 136  K 5.1 4.8 5.3* 5.8* 5.5* 5.7* 5.4*  CL 105 105 104 102 101 104 103  CO2 26 24 23 24 24 24 25   GLUCOSE 240* 244* 257* 145* 80 78 151*  BUN 75* 75* 79* 77* 82* 80* 83*  CREATININE 4.39* 4.00* 3.94* 3.91* 4.37* 4.53* 4.71*  CALCIUM  7.5* 7.1* 7.2* 7.2* 7.4* 7.0* 7.0*  MG 2.1 1.9 2.0 1.7 1.7 1.7  --   PHOS 5.5* 4.6 4.5 3.9  --  4.6  --     Liver Function Tests: Recent Labs  Lab 05/07/24 0653 05/08/24 0520  AST 19 16  ALT 15 17  ALKPHOS 41 48  BILITOT 0.3 0.7  PROT 5.3* 5.7*  ALBUMIN  2.0* 2.1*   Recent Labs  Lab 05/06/24 1817  LIPASE 25   No results for input(s): AMMONIA in the last 168 hours.  CBC: Recent Labs  Lab 05/10/24 0536 05/11/24 0635 05/11/24 2240 05/12/24 0558 05/13/24 0404  WBC 8.4 7.4 6.6 8.8 4.6  HGB 9.7* 10.2* 9.4* 8.4* 8.2*  HCT 30.5* 32.1* 30.1* 27.0* 26.3*  MCV 91.3 93.6 93.2 93.8 93.9  PLT 201 158 137* 125* 118*    Cardiac Enzymes: No results for input(s): CKTOTAL, CKMB, CKMBINDEX, TROPONINI in the last 168 hours.  BNP: Invalid input(s): POCBNP  CBG: Recent  Labs  Lab 05/12/24 0801 05/12/24 1110 05/12/24 1701 05/12/24 2039 05/13/24 0714  GLUCAP 83 145* 100* 109* 101*    Microbiology: Results for orders placed or performed during the hospital encounter of 05/02/24  Resp panel by RT-PCR (RSV, Flu A&B, Covid) Anterior Nasal Swab     Status: None   Collection Time: 05/02/24  8:06 PM   Specimen: Anterior Nasal Swab  Result Value Ref Range Status   SARS Coronavirus 2 by RT PCR NEGATIVE NEGATIVE Final    Comment: (NOTE) SARS-CoV-2 target nucleic acids are NOT DETECTED.  The SARS-CoV-2 RNA is generally detectable in upper respiratory specimens during the acute phase of infection. The lowest concentration of SARS-CoV-2 viral copies this  assay can detect is 138 copies/mL. A negative result does not preclude SARS-Cov-2 infection and should not be used as the sole basis for treatment or other patient management decisions. A negative result may occur with  improper specimen collection/handling, submission of specimen other than nasopharyngeal swab, presence of viral mutation(s) within the areas targeted by this assay, and inadequate number of viral copies(<138 copies/mL). A negative result must be combined with clinical observations, patient history, and epidemiological information. The expected result is Negative.  Fact Sheet for Patients:  BloggerCourse.com  Fact Sheet for Healthcare Providers:  SeriousBroker.it  This test is no t yet approved or cleared by the [place]  FDA and  has been authorized for detection and/or diagnosis of SARS-CoV-2 by FDA under an Emergency Use Authorization (EUA). This EUA will remain  in effect (meaning this test can be used) for the duration of the COVID-19 declaration under Section 564(b)(1) of the Act, 21 U.S.C.section 360bbb-3(b)(1), unless the authorization is terminated  or revoked sooner.       Influenza A by PCR NEGATIVE NEGATIVE Final   Influenza B by PCR NEGATIVE NEGATIVE Final    Comment: (NOTE) The Xpert Xpress SARS-CoV-2/FLU/RSV plus assay is intended as an aid in the diagnosis of influenza from Nasopharyngeal swab specimens and should not be used as a sole basis for treatment. Nasal washings and aspirates are unacceptable for Xpert Xpress SARS-CoV-2/FLU/RSV testing.  Fact Sheet for Patients: BloggerCourse.com  Fact Sheet for Healthcare Providers: SeriousBroker.it  This test is not yet approved or cleared by the [place]  FDA and has been authorized for detection and/or diagnosis of SARS-CoV-2 by FDA under an Emergency Use Authorization (EUA). This EUA will  remain in effect (meaning this test can be used) for the duration of the COVID-19 declaration under Section 564(b)(1) of the Act, 21 U.S.C. section 360bbb-3(b)(1), unless the authorization is terminated or revoked.     Resp Syncytial Virus by PCR NEGATIVE NEGATIVE Final    Comment: (NOTE) Fact Sheet for Patients: BloggerCourse.com  Fact Sheet for Healthcare Providers: SeriousBroker.it  This test is not yet approved or cleared by the [place]  FDA and has been authorized for detection and/or diagnosis of SARS-CoV-2 by FDA under an Emergency Use Authorization (EUA). This EUA will remain in effect (meaning this test can be used) for the duration of the COVID-19 declaration under Section 564(b)(1) of the Act, 21 U.S.C. section 360bbb-3(b)(1), unless the authorization is terminated or revoked.  Performed at [facility], [address] [place], [place] [address]   Body fluid culture w Gram Stain     Status: None   Collection Time: 05/05/24  4:10 PM   Specimen: PATH Cytology Pleural fluid  Result Value Ref Range Status   Specimen Description   Final  FLUID Performed at [facility], [address], [place], [place] [address]    Special Requests   Final    CYTO PLEU Performed at [facility], [address] [place], [place] [address]    Gram Stain NO WBC SEEN NO ORGANISMS SEEN CYTOSPIN SMEAR   Final   Culture   Final    NO GROWTH 3 DAYS Performed at [facility], [address] [place], [place] [address]    Report Status 05/09/2024 FINAL  Final  MRSA Next Gen by PCR, Nasal     Status: Abnormal   Collection Time: 05/11/24  9:08 PM   Specimen: Nasal Mucosa; Nasal Swab  Result Value Ref Range Status   MRSA by PCR Next Gen DETECTED (A) NOT DETECTED Final    Comment: RESULT CALLED TO, READ BACK BY AND VERIFIED WITH:  HOLLY CHANDLER AT 0007 05/12/24 JG (NOTE) The GeneXpert MRSA Assay  (FDA approved for NASAL specimens only), is one component of a comprehensive MRSA colonization surveillance program. It is not intended to diagnose MRSA infection nor to guide or monitor treatment for MRSA infections. Test performance is not FDA approved in patients less than 48 years old. Performed at [facility], [address] [place], [place] [address]   Culture, blood (Routine X 2) w Reflex to ID Panel     Status: None (Preliminary result)   Collection Time: 05/11/24  9:32 PM   Specimen: BLOOD  Result Value Ref Range Status   Specimen Description BLOOD BLOOD RIGHT HAND  Final   Special Requests   Final    BOTTLES DRAWN AEROBIC AND ANAEROBIC Blood Culture results may not be optimal due to an inadequate volume of blood received in culture bottles   Culture   Final    NO GROWTH < 12 HOURS Performed at [facility], [address], [place], [place] [address]    Report Status PENDING  Incomplete  Culture, blood (Routine X 2) w Reflex to ID Panel     Status: None (Preliminary result)   Collection Time: 05/11/24  9:32 PM   Specimen: BLOOD  Result Value Ref Range Status   Specimen Description BLOOD BLOOD RIGHT ARM  Final   Special Requests   Final    BOTTLES DRAWN AEROBIC AND ANAEROBIC Blood Culture adequate volume   Culture   Final    NO GROWTH < 12 HOURS Performed at [facility], [address] [place], [place] [address]    Report Status PENDING  Incomplete    Coagulation Studies: No results for input(s): LABPROT, INR in the last 72 hours.  Urinalysis: Recent Labs    05/11/24 1630  COLORURINE YELLOW*  LABSPEC 1.018  PHURINE 5.0  GLUCOSEU 50*  HGBUR MODERATE*  BILIRUBINUR NEGATIVE  KETONESUR NEGATIVE  PROTEINUR >=300*  NITRITE NEGATIVE  LEUKOCYTESUR LARGE*      Imaging: DG Chest Port 1 View Result Date: 05/12/2024 CLINICAL DATA:  Respiratory failure with hypoxia. Shortness of breath. Facial swelling. EXAM: PORTABLE CHEST 1  VIEW COMPARISON:  05/11/2024 FINDINGS: Stable mildly enlarged cardiac silhouette. Stable small to moderate-sized left pleural effusion, small right pleural effusion and bilateral lower lung zone linear atelectasis/scarring. Stable thoracic spine fixation hardware and post corpectomy hardware. IMPRESSION: 1. Stable small to moderate-sized left pleural effusion, small right pleural effusion and bilateral lower lung zone linear atelectasis/scarring. 2. Stable mild cardiomegaly. Electronically Signed   By: Elspeth Bathe M.D.   On: 05/12/2024 13:36   DG Abd 1  View Result Date: 05/12/2024 CLINICAL DATA:  [id number] Constipation 358444. EXAM: ABDOMEN - 1 VIEW COMPARISON:  None Available. FINDINGS: The bowel gas pattern is non-obstructive. There is mild-to-moderate stool burden mainly overlying the rectum/sigmoid colon region. No evidence of pneumoperitoneum, within the limitations of a supine film. No acute osseous abnormalities. The soft tissues are within normal limits. Surgical changes, devices, tubes and lines: Thoracic spinal fixation hardware noted. IMPRESSION: Nonobstructive bowel gas pattern. Mild-to-moderate stool burden mainly overlying the rectum/sigmoid colon region. Electronically Signed   By: Ree Molt M.D.   On: 05/12/2024 10:54   CT CHEST ABDOMEN PELVIS WO CONTRAST Result Date: 05/11/2024 CLINICAL DATA:  Sepsis Shortness of Breath; Facial Swelling Mental status change, unknown cause Best possible images due to pt condition EXAM: CT CHEST, ABDOMEN AND PELVIS WITHOUT CONTRAST TECHNIQUE: Multidetector CT imaging of the chest, abdomen and pelvis was performed following the standard protocol without IV contrast. RADIATION DOSE REDUCTION: This exam was performed according to the departmental dose-optimization program which includes automated exposure control, adjustment of the mA and/or kV according to patient size and/or use of iterative reconstruction technique. COMPARISON:  CT abdomen pelvis 05/05/2024,  CT chest 05/05/2024 FINDINGS: CT CHEST FINDINGS Cardiovascular: Normal heart size. No significant pericardial effusion. The thoracic aorta is normal in caliber. Mild atherosclerotic plaque of the thoracic aorta. Left anterior descending coronary artery calcifications. Mediastinum/Nodes: No gross hilar adenopathy, noting limited sensitivity for the detection of hilar adenopathy on this noncontrast study. no enlarged mediastinal or axillary lymph nodes. Thyroid gland and esophagus demonstrate no significant findings. Interval development of debris/mucous within the right mainstem bronchi measuring up to 1.3 cm. Lungs/Pleura: Limited evaluation due to noncontrast study and motion artifact. Bilateral lower lobes consolidations. No pulmonary nodule. No pulmonary mass. Redemonstration of bilateral small, left greater than right, pleural effusions with pleural thickening. Pleural effusion. No pneumothorax. Musculoskeletal: Bilateral gynecomastia. No suspicious lytic or blastic osseous lesions. No acute displaced fracture. Degenerative changes of the cervical spine at the C5-C6 level. CT ABDOMEN PELVIS FINDINGS Hepatobiliary: No focal liver abnormality. No gallstones, gallbladder wall thickening, or pericholecystic fluid. No biliary dilatation. Pancreas: Diffusely atrophic. No focal lesion. Otherwise normal pancreatic contour. No surrounding inflammatory changes. No main pancreatic ductal dilatation. Spleen: Normal in size without focal abnormality. Adrenals/Urinary Tract: No adrenal nodule bilaterally. No nephrolithiasis and no hydronephrosis. No definite contour-deforming renal mass. No ureterolithiasis or hydroureter. The urinary bladder is decompressed with suprapubic catheter terminating within the lumen. Stomach/Bowel: Stomach is within normal limits. No evidence of bowel wall thickening or dilatation. Stool within the rectosigmoid colon. Appendix appears normal. Vascular/Lymphatic: No abdominal aorta or iliac  aneurysm. Moderate atherosclerotic plaque of the aorta and its branches. No abdominal, pelvic, or inguinal lymphadenopathy. Reproductive: Prostate is unremarkable. Other: No intraperitoneal free fluid. No intraperitoneal free gas. No organized fluid collection. Musculoskeletal: At least small fat containing umbilical hernia with an abdominal defect of 2.7 cm. Subcutaneus soft tissue edema. No suspicious lytic or blastic osseous lesions. No acute displaced fracture. Stable chronic T12 and L1 compression fractures. Thoracolumbar posterolateral surgical fusion surgical hardware. T8 and T9 corpectomy. IMPRESSION: 1. Interval development of debris within the right mainstem bronchi measuring up to 1.3 cm. 2. Bilateral lower lobe consolidations with limited evaluation on this noncontrast study. Finding may represent infection/inflammation versus atelectasis. 3. Redemonstration of bilateral small, left greater than right, pleural effusions with pleural thickening. 4. No acute intra-abdominal or intrapelvic abnormality. 5. Limited evaluation due to noncontrast study. 6.  Aortic Atherosclerosis (ICD10-I70.0). Electronically Signed   By:  Morgane  Naveau M.D.   On: 05/11/2024 21:21   CT HEAD WO CONTRAST ( ) Result Date: 05/11/2024 CLINICAL DATA:  Mental status change, unknown cause EXAM: CT HEAD WITHOUT CONTRAST TECHNIQUE: Contiguous axial images were obtained from the base of the skull through the vertex without intravenous contrast. RADIATION DOSE REDUCTION: This exam was performed according to the departmental dose-optimization program which includes automated exposure control, adjustment of the mA and/or kV according to patient size and/or use of iterative reconstruction technique. COMPARISON:  None Available. FINDINGS: Brain: No evidence of acute infarction, hemorrhage, hydrocephalus, extra-axial collection or mass lesion/mass effect. Vascular: No hyperdense vessel. Skull: No acute fracture. Sinuses/Orbits: Clear  sinuses.  No acute orbital findings. Other: No mastoid effusions. IMPRESSION: No evidence of acute intracranial abnormality. Electronically Signed   By: Gilmore GORMAN Molt M.D.   On: 05/11/2024 21:17     Medications:    sodium chloride  50 mL/hr at 05/13/24 0200   linezolid  (ZYVOX ) IV Stopped (05/12/24 2319)   piperacillin -tazobactam (ZOSYN )  IV 3.375 g (05/13/24 0356)      arformoterol   15 mcg Nebulization BID   bisacodyl   10 mg Oral QHS   bisacodyl   10 mg Rectal Daily   Chlorhexidine  Gluconate Cloth  6 each Topical QHS   vitamin B-12  1,000 mcg Oral Daily   famotidine   10 mg Oral Daily   feeding supplement  237 mL Oral BID BM   folic acid   1 mg Oral Daily   gabapentin   300 mg Oral QHS   heparin  injection (subcutaneous)  5,000 Units Subcutaneous Q8H   insulin  aspart  0-15 Units Subcutaneous TID WC   insulin  aspart  4 Units Subcutaneous TID WC   insulin  glargine  14 Units Subcutaneous BID   ipratropium-albuterol   3 mL Nebulization BID   levothyroxine   175 mcg Oral Q0600   linaclotide   145 mcg Oral QAC breakfast   linagliptin   5 mg Oral Daily   melatonin  10 mg Oral QHS   metoCLOPramide   5 mg Oral TID AC   midodrine   10 mg Oral TID WC   polyethylene glycol  17 g Oral BID   sodium chloride  HYPERTONIC  4 mL Nebulization BID   Vitamin D  (Ergocalciferol )  50,000 Units Oral Q7 days   acetaminophen  **OR** acetaminophen , albuterol , hydrALAZINE  **OR** hydrALAZINE , metoprolol  tartrate, ondansetron  **OR** ondansetron  (ZOFRAN ) IV, oxyCODONE , traZODone   Assessment/ Plan:  Mr. Mattis Featherly is a 63 y.o.  male  with past medical conditions including diabetes, bilateral BKA, suprapubic catheter, hepatitis C, who was admitted to [facility] on 04/14/2024 for Suprapubic catheter (HCC) [Z93.59] AKI (acute kidney injury) (HCC) [N17.9] Acute kidney injury (HCC) [N17.9]    Acute kidney injury with proteinuria chronic kidney disease stage IV, creatinine 3.74 at time of previous discharge.  Creatinine 4.19  on admission.  Etiology of AKI unclear at this time.  Concerning for progression of kidney.   Creatinine a bit higher today at 4.71.  Continue IV fluid hydration for now. Need for dialysis as the patient is producing good amounts of urine.   Lab Results  Component Value Date   CREATININE 4.71 (H) 05/13/2024   CREATININE 4.53 (H) 05/12/2024   CREATININE 4.37 (H) 05/11/2024     Intake/Output Summary (Last 24 hours) at 05/13/2024 0806 Last data filed at 05/13/2024 0600 Gross per 24 hour  Intake 2011.78 ml  Output 1830 ml  Net 181.78 ml       2.  Hypertension with chronic kidney disease.  Blood pressure currently 104/41.  Currently taken off of all antihypertensive and started on midodrine .  3.  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 Urine protein to creatinine ratio of 15.4 via Foley specimen.  Proteinuria likely secondary to diabetic nephropathy.  Hemoglobin A1c of 6.0% from 04/14/2024.  Previously diabetes control was suboptimal.  Glucose well controlled  4. Anemia of chronic kidney disease Lab Results  Component Value Date   HGB 8.2 (L) 05/13/2024    Hemoglobin low but stable at 8.2.  Consider starting the patient on Epogen if hemoglobin continues to trend down.   LOS: 10 Breann Losano 8/20/20258:06 AM

## 2024-05-13 NOTE — Progress Notes (Signed)
 Progress Note   Patient: Eugene [name] FMW:[id number] DOB: 1961-06-24 DOA: 05/02/2024     10 DOS: the patient was seen and examined on 05/13/2024   Brief hospital course:  Medical records reviewed and are as summarized below:   Briggs Edelen is a 63 y.o. male with medical history significant for anxiety, depression, type 2 diabetes mellitus, dyslipidemia, hepatitis C, spinal stenosis, IBS, osteomyelitis, status post bilateral BKA, who presented to the emergency room with acute onset of dyspnea with associated facial and periorbital swelling with associated wheezing. He said he was told by the staff at the nursing home but his face was swollen and he was probably having an allergic reaction.  He is not aware of any recent change in diet or medication that will trigger any allergic reaction.  No fever or chills.  The patient was discharged from here on 7/22 after being managed for AKI, catheter associated UTI, hyperkalemia, ileus and acute hypoxic respiratory failure.      Initial vital signs in the ED temperature 99.1 F, respiratory rate 20, pulse 74, BP 122/82, O2 sat 93% on 6 L of oxygen.   Chest x-ray impression:   Overall, unchanged appearance of the bilateral pleural effusions and mid and lower lung airspace disease, likely atelectasis     He was given 1 dose of EpiPen , IV steroids, IV famotidine  and IV fluids for suspected anaphylaxis.   I assumed care on 05/13/24. Further hospital course and management as outlined below.   Assessment and Plan:  Acute hypoxic respiratory failure:  May have underlying chronic hypoxic respiratory failure and require home O2 inhalation on discharge 8/19 worsening of shortness of breath - transferred to stepdown, required heated high flow oxygen at 40-50 L/min.  Now weaned back to 6 L/min. Patient was febrile, suspected infection started antibiotics. CT chest reviewed. --Continue supplemental O2 inhalation and gradually wean off --PCCM  consulted --Follow cultures and de-escalate antibiotics   COPD exacerbation. 8/12 Solu-Medrol  40 mg IV x 2 doses followed by prednisone  40 mg p.o. daily x 2 doses, 30 mg pod x 2 doses CT chest showed pleural effusion and atelectasis 8/12 s/p left thoracentesis 100 mL fluid was tapped.  No WBC/organism, culture negative.  High LDH, low protein, partially exudative. --Continue Brovana  neb BID --Scheduled DuoNeb Q6H    AKI on CKD stage IV, with proteinuria Patient is volume overload secondary to CKD Patient may need hemodialysis in near future --Continue IV Lasix  as per nephro --Monitor urine output and renal functions daily   Fluid overload, most likely due to CKD IV and hypoalbuminemia, Acute on chronic diastolic CHF: S/p IV Lasix  x 1 dose on 05/03/2024.   BNP 172.1. 2D echo on 04/19/2024 showed EF estimated at 55 to 60%, indeterminate LV diastolic parameters, left and right atrial were not assessed, technically difficult study. 8/13 increased Lasix  80 mg IV twice daily  --Currently off diuretics and on gentle IV fluids --Nephrology following - appreciate recommendations   Hyperkalemia secondary to renal failure 8/13 Lokelma  given.  Hyperkalemia resolved 8/18 K 5.8, Lokelma  10 g x 2 doses ordered 8/20 K 5.4 --Lokelma  10 g x 1 dose --Monitor potassium level daily     Hypoalbuminemia likely contributing to fluid overload.  Albumin  1.9.  Probable AKI on CKD stage IV versus progressive CKD stage IV:  Creatinine slowly trending upward.   s/p Iv albumin   --Nephrology following   Suspected anaphylactic reaction: S/p EpiPen  on 05/02/2024, IV Pepcid  and IV Solu-Medrol .  Difficult to rule in or exclude  anaphylactic reaction at this time. Facial swelling seems more likely due to fluid overload.     Type II DM with hyperglycemia: IV steroids likely contributed to severe hyperglycemia. Hemoglobin A1c is 6.0 on 04/14/2024. 8/14 increased Semglee  18 units twice daily and NovoLog  6 units 3  times daily with meals Hyperglycemia due to steroids --Monitor CBG's & titrate regimen for inpatient goal 140-180     Hypertension, developed hypotension on 8/19 Patient developed high blood pressure due to fecal impaction and treated with IV Lopressor .  Patient was on oral Lopressor  which was discontinued on 8/19 due to low BP 8/19 hypotension, started midodrine  10 mg p.o. 3 times daily --Monitor BP and titrate medications accordingly     Abdominal discomfort, could be gastroparesis versus ileus CT abdomen pelvis negative for any obstruction 8/19 constipation and fecal impaction caused respiratory distress and tachycardia, patient was transferred to ICU.  CT scan showed fecal impaction.  Patient refused enema on 8/18 8/19 agreed for enema      Vitamin D  deficiency: started vitamin D  50,000 units p.o. weekly, follow with PCP to repeat vitamin D  level after 3 to 6 months.   Vitamin B12 level 240, at lower end, goal >400,  started vitamin B12 supplement to prevent deficiency.  Repeat B12 level after 3 to 6 months.   Folic acid  level 6.8, at lower end, started folic acid  1 mg p.o. daily to prevent deficiency.  Repeat folic acid  level after 3 to 6 months.   Comorbidities Peripheral neuropathy, hypertension, hypothyroidism, IBS, bilateral BKA   Recent discharge from the hospital on 05/01/2024 after hospitalization for AKI, catheter associated UTI, suspected pneumonia, acute hypoxic respiratory failure, gastroparesis, ileus, chronic opioid use       Consultants: Nephrology Pulmonology   Procedures: 8/12 s/p left thoracentesis      Subjective: Pt seen in stepdown this AM, awake resting in bed.  He reports getting sick this AM (vomited), was given Zofran .  Able to keep down some oatmeal.  Otherwise pt denies acute complaints.    Physical Exam: Vitals:   05/13/24 0500 05/13/24 0600 05/13/24 0807 05/13/24 1200  BP:    (!) 107/47  Pulse:    93  Resp:    (!) 0  Temp:       TempSrc:      SpO2: 95%  96% 92%  Weight:  131.8 kg    Height:       General exam: awake, alert, no acute distress HEENT: moist mucus membranes, hearing grossly normal  Respiratory system: CTAB, no wheezes, rales or rhonchi, normal respiratory effort at rest on 6 L/min HF O2. Cardiovascular system: normal S1/S2, RRR Gastrointestinal system: soft, NT, ND Central nervous system: A&O x 3. no gross focal neurologic deficits, normal speech Extremities: bilateral BKA's Skin: dry, intact, normal temperature Psychiatry: normal mood, congruent affect, judgement and insight appear normal   Data Reviewed:  Notable labs -- K 5.4, glucose 151, BUN 83, Cr 4.71, Ca 7.0, Hbg stable 8.4 >> 8.2, platelets 118k  Family Communication: None present. Will attempt to call as time allows.  Disposition: Status is: Inpatient Remains inpatient appropriate because: weaning oxygen still needing 6 L/min   Planned Discharge Destination: return to prior facility    Time spent: 45 minutes  Author: Burnard DELENA Cunning, DO 05/13/2024 1:33 PM  For on call review [email or web address].

## 2024-05-14 DIAGNOSIS — N049 Nephrotic syndrome with unspecified morphologic changes: Secondary | ICD-10-CM | POA: Diagnosis not present

## 2024-05-14 LAB — RENAL FUNCTION PANEL
Albumin: 1.8 g/dL — ABNORMAL LOW (ref 3.5–5.0)
Anion gap: 8 (ref 5–15)
BUN: 77 mg/dL — ABNORMAL HIGH (ref 8–23)
CO2: 26 mmol/L (ref 22–32)
Calcium: 7.2 mg/dL — ABNORMAL LOW (ref 8.9–10.3)
Chloride: 104 mmol/L (ref 98–111)
Creatinine, Ser: 4.73 mg/dL — ABNORMAL HIGH (ref 0.61–1.24)
GFR, Estimated: 13 mL/min — ABNORMAL LOW (ref >60)
Glucose, Bld: 93 mg/dL (ref 70–99)
Phosphorus: 6.2 mg/dL — ABNORMAL HIGH (ref 2.5–4.6)
Potassium: 5.7 mmol/L — ABNORMAL HIGH (ref 3.5–5.1)
Sodium: 138 mmol/L (ref 135–145)

## 2024-05-14 LAB — CBC
HCT: 28.2 % — ABNORMAL LOW (ref 39.0–52.0)
Hemoglobin: 8.5 g/dL — ABNORMAL LOW (ref 13.0–17.0)
MCH: 29.1 pg (ref 26.0–34.0)
MCHC: 30.1 g/dL (ref 30.0–36.0)
MCV: 96.6 fL (ref 80.0–100.0)
Platelets: 124 K/uL — ABNORMAL LOW (ref 150–400)
RBC: 2.92 MIL/uL — ABNORMAL LOW (ref 4.22–5.81)
RDW: 12.5 % (ref 11.5–15.5)
WBC: 5.5 K/uL (ref 4.0–10.5)
nRBC: 0 % (ref 0.0–0.2)

## 2024-05-14 LAB — LEGIONELLA PNEUMOPHILA SEROGP 1 UR AG: L. pneumophila Serogp 1 Ur Ag: NEGATIVE

## 2024-05-14 LAB — GLUCOSE, CAPILLARY
Glucose-Capillary: 89 mg/dL (ref 70–99)
Glucose-Capillary: 127 mg/dL — ABNORMAL HIGH (ref 70–99)
Glucose-Capillary: 162 mg/dL — ABNORMAL HIGH (ref 70–99)
Glucose-Capillary: 156 mg/dL — ABNORMAL HIGH (ref 70–99)

## 2024-05-14 LAB — URINE CULTURE: Culture: >=100000 — AB

## 2024-05-14 MED ORDER — SENNOSIDES-DOCUSATE SODIUM 8.6-50 MG PO TABS
1.0000 | ORAL_TABLET | Freq: Two times a day (BID) | ORAL | Status: DC
  Administered 2024-05-14 – 2024-05-16 (×5): 1 via ORAL
  Filled 2024-05-14 (×6): qty 1

## 2024-05-14 MED ORDER — SODIUM CHLORIDE 0.9% FLUSH: 10.0000 mL | INTRAVENOUS | Status: DC | PRN

## 2024-05-14 MED ORDER — PATIROMER SORBITEX CALCIUM 8.4 G PO PACK
16.8000 g | PACK | Freq: Every day | ORAL | Status: DC
  Administered 2024-05-15 – 2024-05-18 (×4): 16.8 g via ORAL
  Filled 2024-05-14 (×5): qty 2

## 2024-05-14 NOTE — Progress Notes (Signed)
 Medications found unsealed within medicine cup from previous shift including controlled substance oxycodone  10mg  ( 2 tablets) 10 tablets of assorted medications found on pt table, discarded said meds and wasting controlled substance with Charge nurse.

## 2024-05-14 NOTE — TOC Progression Note (Signed)
 Transition of Care [facility]) - Progression Note    Patient Details  Name: Eugene [name] MRN: [id number] Date of Birth: 1960/12/22  Transition of [facility]) CM/SW Contact  Lauraine JAYSON Carpen, LCSW Phone Number: 05/14/2024, 1:42 PM  Clinical Narrative:  CSW continues to follow for return to SNF.   Expected Discharge Plan: Skilled Nursing Facility Barriers to Discharge: Continued Medical Work up               Expected Discharge Plan and Services     Post Acute Care Choice: Resumption of Svcs/PTA Provider Living arrangements for the past 2 months: Skilled Nursing Facility                                       Social Drivers of Health (SDOH) Interventions SDOH Screenings   Food Insecurity: Patient Declined (05/03/2024)  Housing: Low Risk  (05/03/2024)  Transportation Needs: No Transportation Needs (05/03/2024)  Financial Resource Strain: Medium Risk (02/02/2019)  Physical Activity: Unknown (02/02/2019)  Social Connections: Somewhat Isolated (02/02/2019)  Stress: Stress Concern Present (02/02/2019)  Tobacco Use: High Risk (05/02/2024)    Readmission Risk Interventions    05/05/2024    3:05 PM  Readmission Risk Prevention Plan  Transportation Screening Complete  Medication Review (RN Care Manager) Complete  PCP or Specialist appointment within 3-5 days of discharge Complete  SW Recovery Care/Counseling Consult Complete  Palliative Care Screening Not Applicable  Skilled Nursing Facility Complete

## 2024-05-14 NOTE — Plan of Care (Signed)

## 2024-05-14 NOTE — Progress Notes (Signed)
 Progress Note   Patient: Eugene [name] FMW:[id number] DOB: 16-Dec-1960 DOA: 05/02/2024     11 DOS: the patient was seen and examined on 05/14/2024   Brief hospital course:  Medical records reviewed and are as summarized below:   Eugene [name] is a 63 y.o. male with medical history significant for anxiety, depression, type 2 diabetes mellitus, dyslipidemia, hepatitis C, spinal stenosis, IBS, osteomyelitis, status post bilateral BKA, who presented to the emergency room with acute onset of dyspnea with associated facial and periorbital swelling with associated wheezing. He said he was told by the staff at the nursing home but his face was swollen and he was probably having an allergic reaction.  He is not aware of any recent change in diet or medication that will trigger any allergic reaction.  No fever or chills.  The patient was discharged from here on 7/22 after being managed for AKI, catheter associated UTI, hyperkalemia, ileus and acute hypoxic respiratory failure.      Initial vital signs in the ED temperature 99.1 F, respiratory rate 20, pulse 74, BP 122/82, O2 sat 93% on 6 L of oxygen.   Chest x-ray impression:   Overall, unchanged appearance of the bilateral pleural effusions and mid and lower lung airspace disease, likely atelectasis     He was given 1 dose of EpiPen , IV steroids, IV famotidine  and IV fluids for suspected anaphylaxis.   I assumed care on 05/13/24. Further hospital course and management as outlined below.   Assessment and Plan:  Acute hypoxic respiratory failure:  May have underlying chronic hypoxic respiratory failure and require home O2 inhalation on discharge 8/19 worsening of shortness of breath - transferred to stepdown, required heated high flow oxygen at 40-50 L/min.  Now weaned back to 6 L/min. Patient was febrile, suspected infection started antibiotics. CT chest reviewed. --Continue supplemental O2 inhalation and wean as tolerated --PCCM  consulted --Follow cultures and de-escalate antibiotics --Stop Zosyn , continue linezolid  for now   COPD exacerbation. 8/12 Solu-Medrol  40 mg IV x 2 doses followed by prednisone  40 mg p.o. daily x 2 doses, 30 mg pod x 2 doses CT chest showed pleural effusion and atelectasis 8/12 s/p left thoracentesis 100 mL fluid was tapped.  No WBC/organism, culture negative.  High LDH, low protein, partially exudative. --Continue Brovana  neb BID --Scheduled DuoNeb Q6H    AKI on CKD stage IV, with proteinuria Patient is volume overload secondary to CKD Patient may need hemodialysis in near future --Treated w/ IV Lasix  initially as per nephro --Monitor urine output and renal functions daily   Fluid overload, most likely due to CKD IV and hypoalbuminemia, Acute on chronic diastolic CHF: S/p IV Lasix  x 1 dose on 05/03/2024.   BNP 172.1. 2D echo on 04/19/2024 showed EF estimated at 55 to 60%, indeterminate LV diastolic parameters, left and right atrial were not assessed, technically difficult study. 8/13 increased Lasix  80 mg IV twice daily  --Currently off diuretics and on gentle IV fluids --Nephrology following - appreciate recommendations   Hyperkalemia secondary to renal failure 8/13 Lokelma  given.  Hyperkalemia resolved 8/18 K 5.8, Lokelma  10 g x 2 doses ordered 8/20 K 5.4 --Lokelma  10 g x 1 dose --Monitor potassium level daily     Hypoalbuminemia likely contributing to fluid overload.  Albumin  1.9.  Probable AKI on CKD stage IV versus progressive CKD stage IV:  Creatinine slowly trending upward.   s/p Iv albumin   --Nephrology following   Suspected anaphylactic reaction: S/p EpiPen  on 05/02/2024, IV Pepcid  and IV  Solu-Medrol .  Difficult to rule in or exclude anaphylactic reaction at this time. Facial swelling seems more likely due to fluid overload.     Type II DM with hyperglycemia: IV steroids likely contributed to severe hyperglycemia. Hemoglobin A1c is 6.0 on 04/14/2024. 8/14 increased  Semglee  18 units twice daily and NovoLog  6 units 3 times daily with meals Hyperglycemia due to steroids --Monitor CBG's & titrate regimen for inpatient goal 140-180     Hypertension, developed hypotension on 8/19 Patient developed high blood pressure due to fecal impaction and treated with IV Lopressor .  Patient was on oral Lopressor  which was discontinued on 8/19 due to low BP 8/19 hypotension, started midodrine  10 mg p.o. 3 times daily --Monitor BP and titrate medications accordingly     Abdominal discomfort, could be gastroparesis versus ileus CT abdomen pelvis negative for any obstruction 8/19 constipation and fecal impaction caused respiratory distress and tachycardia, patient was transferred to ICU.  CT scan showed fecal impaction.  Patient refused enema on 8/18 8/19 agreed for enema  --Continue bowel regimen - miralax  BID, Senna-S BID, suppository daily     Vitamin D  deficiency: started vitamin D  50,000 units p.o. weekly, follow with PCP to repeat vitamin D  level after 3 to 6 months.   Vitamin B12 level 240, at lower end, goal >400,  started vitamin B12 supplement to prevent deficiency.  Repeat B12 level after 3 to 6 months.   Folic acid  level 6.8, at lower end, started folic acid  1 mg p.o. daily to prevent deficiency.  Repeat folic acid  level after 3 to 6 months.   Comorbidities Peripheral neuropathy, hypertension, hypothyroidism, IBS, bilateral BKA   Recent discharge from the hospital on 05/01/2024 after hospitalization for AKI, catheter associated UTI, suspected pneumonia, acute hypoxic respiratory failure, gastroparesis, ileus, chronic opioid use       Consultants: Nephrology Pulmonology   Procedures: 8/12 s/p left thoracentesis      Subjective: Pt was sleeping comfortably this AM, woke briefly to voice.  Reports being tired today but no other acute complaints including chest pain, shortness of breath or fever/chills.   Physical Exam: Vitals:   05/13/24 2113  05/13/24 2346 05/14/24 0623 05/14/24 0941  BP:  (!) 125/56  (!) 146/74  Pulse:  (!) 37  73  Resp:  20  (!) 22  Temp:  98.2 F (36.8 C)    TempSrc:      SpO2: 98% 95% 96% 98%  Weight:      Height:       General exam: sleeping comfortably, wakes to voice, no acute distress HEENT: moist mucus membranes, hearing grossly normal  Respiratory system: CTAB, no wheezes, rales or rhonchi, normal respiratory effort at rest on 6 L/min HF O2. Cardiovascular system: normal S1/S2, RRR Gastrointestinal system: soft, NT, ND Central nervous system: no gross focal neurologic deficits, normal speech Extremities: bilateral BKA's Skin: dry, intact, normal temperature Psychiatry: normal mood, congruent affect   Data Reviewed:  Notable labs -- K 5.7, BUN 77, Cr 4.73, Ca 7.2, Hbg stable 8.4 >> 8.2 >> 8.5, platelets 118 >> 124k  Family Communication: None present. Will attempt to call as time allows.  Disposition: Status is: Inpatient Remains inpatient appropriate because: weaning oxygen still needing 6 L/min   Planned Discharge Destination: return to prior facility    Time spent: 42 minutes  Author: Burnard DELENA Cunning, DO 05/14/2024 12:56 PM  For on call review [email or web address].

## 2024-05-14 NOTE — Progress Notes (Addendum)
 [facility]  ROUNDING NOTE   Subjective:   Eugene [name]  is a 63 y.o.  male  with past medical conditions including diabetes, bilateral BKA, suprapubic catheter, and hepatitis C. Patient presents to ED from SNF with facial edema and has been admitted for Wheezing [R06.2] Anaphylaxis [T78.2XXA] Facial swelling [R22.0] AKI (acute kidney injury) (HCC) [N17.9] Acute on chronic respiratory failure with hypoxia (HCC) [J96.21]  Patient is known to our practice from recent admission. He was released and pending follow up appt in our office at discharge.  Update:  Patient laying In bed Alert and oriented Denies pain 6L [place]   08/20 0701 - 08/21 0700 In: 1180 [P.O.:480; IV Piggyback:700] Out: 1650 [Urine:1650]  Lab Results  Component Value Date   CREATININE 4.73 (H) 05/14/2024   CREATININE 4.71 (H) 05/13/2024   CREATININE 4.53 (H) 05/12/2024     Objective:  Vital signs in last 24 hours:  Temp:  [98 F (36.7 C)-98.7 F (37.1 C)] 98.2 F (36.8 C) (08/20 2346) Pulse Rate:  [37-84] 73 (08/21 0941) Resp:  [18-22] 22 (08/21 0941) BP: (125-157)/(56-77) 146/74 (08/21 0941) SpO2:  [90 %-98 %] 98 % (08/21 0941)  Weight change:  Filed Weights   05/03/24 2039 05/07/24 0600 05/13/24 0600  Weight: (!) 137.6 kg 136 kg 131.8 kg    Intake/Output: I/O last 3 completed shifts: In: 1886.5 [P.O.:594; I.V.:280.7; IV Piggyback:1011.8] Out: 2830 [Urine:2830]   Intake/Output this shift:  Total I/O In: 0  Out: 900 [Urine:900]  Physical Exam: General: NAD  Head: Normocephalic  Eyes: Anicteric  Lungs:  Diminished, 6L [place]  Heart: Regular rate   Abdomen:  Distended and firm, obese  Extremities: Trace peripheral edema.,  Bilateral BKA  Neurologic: Awake and alert  Skin: No rashes  Access: None  Suprapubic catheter in place  Basic Metabolic Panel: Recent Labs  Lab 05/09/24 0518 05/10/24 0536 05/11/24 0635 05/11/24 2132 05/12/24 0558 05/13/24 0404 05/14/24 0501   NA 137 136 136 135 139 136 138  K 4.8 5.3* 5.8* 5.5* 5.7* 5.4* 5.7*  CL 105 104 102 101 104 103 104  CO2 24 23 24 24 24 25 26   GLUCOSE 244* 257* 145* 80 78 151* 93  BUN 75* 79* 77* 82* 80* 83* 77*  CREATININE 4.00* 3.94* 3.91* 4.37* 4.53* 4.71* 4.73*  CALCIUM  7.1* 7.2* 7.2* 7.4* 7.0* 7.0* 7.2*  MG 1.9 2.0 1.7 1.7 1.7  --   --   PHOS 4.6 4.5 3.9  --  4.6  --  6.2*    Liver Function Tests: Recent Labs  Lab 05/08/24 0520 05/14/24 0501  AST 16  --   ALT 17  --   ALKPHOS 48  --   BILITOT 0.7  --   PROT 5.7*  --   ALBUMIN  2.1* 1.8*   No results for input(s): LIPASE, AMYLASE in the last 168 hours.  No results for input(s): AMMONIA in the last 168 hours.  CBC: Recent Labs  Lab 05/11/24 0635 05/11/24 2240 05/12/24 0558 05/13/24 0404 05/14/24 0501  WBC 7.4 6.6 8.8 4.6 5.5  HGB 10.2* 9.4* 8.4* 8.2* 8.5*  HCT 32.1* 30.1* 27.0* 26.3* 28.2*  MCV 93.6 93.2 93.8 93.9 96.6  PLT 158 137* 125* 118* 124*    Cardiac Enzymes: No results for input(s): CKTOTAL, CKMB, CKMBINDEX, TROPONINI in the last 168 hours.  BNP: Invalid input(s): POCBNP  CBG: Recent Labs  Lab 05/13/24 0714 05/13/24 1120 05/13/24 1540 05/13/24 2029 05/14/24 0728  GLUCAP 101* 136* 101* 85  89    Microbiology: Results for orders placed or performed during the hospital encounter of 05/02/24  Resp panel by RT-PCR (RSV, Flu A&B, Covid) Anterior Nasal Swab     Status: None   Collection Time: 05/02/24  8:06 PM   Specimen: Anterior Nasal Swab  Result Value Ref Range Status   SARS Coronavirus 2 by RT PCR NEGATIVE NEGATIVE Final    Comment: (NOTE) SARS-CoV-2 target nucleic acids are NOT DETECTED.  The SARS-CoV-2 RNA is generally detectable in upper respiratory specimens during the acute phase of infection. The lowest concentration of SARS-CoV-2 viral copies this assay can detect is 138 copies/mL. A negative result does not preclude SARS-Cov-2 infection and should not be used as the sole  basis for treatment or other patient management decisions. A negative result may occur with  improper specimen collection/handling, submission of specimen other than nasopharyngeal swab, presence of viral mutation(s) within the areas targeted by this assay, and inadequate number of viral copies(<138 copies/mL). A negative result must be combined with clinical observations, patient history, and epidemiological information. The expected result is Negative.  Fact Sheet for Patients:  BloggerCourse.com  Fact Sheet for Healthcare Providers:  SeriousBroker.it  This test is no t yet approved or cleared by the [place]  FDA and  has been authorized for detection and/or diagnosis of SARS-CoV-2 by FDA under an Emergency Use Authorization (EUA). This EUA will remain  in effect (meaning this test can be used) for the duration of the COVID-19 declaration under Section 564(b)(1) of the Act, 21 U.S.C.section 360bbb-3(b)(1), unless the authorization is terminated  or revoked sooner.       Influenza A by PCR NEGATIVE NEGATIVE Final   Influenza B by PCR NEGATIVE NEGATIVE Final    Comment: (NOTE) The Xpert Xpress SARS-CoV-2/FLU/RSV plus assay is intended as an aid in the diagnosis of influenza from Nasopharyngeal swab specimens and should not be used as a sole basis for treatment. Nasal washings and aspirates are unacceptable for Xpert Xpress SARS-CoV-2/FLU/RSV testing.  Fact Sheet for Patients: BloggerCourse.com  Fact Sheet for Healthcare Providers: SeriousBroker.it  This test is not yet approved or cleared by the [place]  FDA and has been authorized for detection and/or diagnosis of SARS-CoV-2 by FDA under an Emergency Use Authorization (EUA). This EUA will remain in effect (meaning this test can be used) for the duration of the COVID-19 declaration under Section 564(b)(1) of the Act,  21 U.S.C. section 360bbb-3(b)(1), unless the authorization is terminated or revoked.     Resp Syncytial Virus by PCR NEGATIVE NEGATIVE Final    Comment: (NOTE) Fact Sheet for Patients: BloggerCourse.com  Fact Sheet for Healthcare Providers: SeriousBroker.it  This test is not yet approved or cleared by the [place]  FDA and has been authorized for detection and/or diagnosis of SARS-CoV-2 by FDA under an Emergency Use Authorization (EUA). This EUA will remain in effect (meaning this test can be used) for the duration of the COVID-19 declaration under Section 564(b)(1) of the Act, 21 U.S.C. section 360bbb-3(b)(1), unless the authorization is terminated or revoked.  Performed at [facility], [address] [place], [place] [address]   Body fluid culture w Gram Stain     Status: None   Collection Time: 05/05/24  4:10 PM   Specimen: PATH Cytology Pleural fluid  Result Value Ref Range Status   Specimen Description   Final    FLUID Performed at [facility], [address], [place], [place] [address]    Special Requests  Final    CYTO PLEU Performed at [facility], [address] [place], [place] [address]    Gram Stain NO WBC SEEN NO ORGANISMS SEEN CYTOSPIN SMEAR   Final   Culture   Final    NO GROWTH 3 DAYS Performed at [facility], [address] [place], [place] [address]    Report Status 05/09/2024 FINAL  Final  Urine Culture (for pregnant, neutropenic or urologic patients or patients with an indwelling urinary catheter)     Status: Abnormal   Collection Time: 05/11/24  5:10 PM   Specimen: Urine, Catheterized  Result Value Ref Range Status   Specimen Description   Final    URINE, CATHETERIZED Performed at [facility], [address] [place], [place] [address]    Special Requests   Final    NONE Performed at [facility], [address]  [place], [place] [address]    Culture (A)  Final    >=100,000 COLONIES/mL STAPHYLOCOCCUS AUREUS METHICILLIN RESISTANT STAPHYLOCOCCUS AUREUS    Report Status 05/14/2024 FINAL  Final   Organism ID, Bacteria STAPHYLOCOCCUS AUREUS (A)  Final      Susceptibility   Staphylococcus aureus - MIC*    CIPROFLOXACIN  >=8 RESISTANT Resistant     GENTAMICIN <=0.5 SENSITIVE Sensitive     NITROFURANTOIN <=16 SENSITIVE Sensitive     OXACILLIN >=4 RESISTANT Resistant     TETRACYCLINE <=1 SENSITIVE Sensitive     VANCOMYCIN  1 SENSITIVE Sensitive     TRIMETH /SULFA  <=10 SENSITIVE Sensitive     RIFAMPIN  <=0.5 SENSITIVE Sensitive     Inducible Clindamycin NEGATIVE Sensitive     LINEZOLID  2 SENSITIVE Sensitive     * >=100,000 COLONIES/mL STAPHYLOCOCCUS AUREUS  MRSA Next Gen by PCR, Nasal     Status: Abnormal   Collection Time: 05/11/24  9:08 PM   Specimen: Nasal Mucosa; Nasal Swab  Result Value Ref Range Status   MRSA by PCR Next Gen DETECTED (A) NOT DETECTED Final    Comment: RESULT CALLED TO, READ BACK BY AND VERIFIED WITH:  HOLLY CHANDLER AT 0007 05/12/24 JG (NOTE) The GeneXpert MRSA Assay (FDA approved for NASAL specimens only), is one component of a comprehensive MRSA colonization surveillance program. It is not intended to diagnose MRSA infection nor to guide or monitor treatment for MRSA infections. Test performance is not FDA approved in patients less than 29 years old. Performed at [facility], [address] [place], [place] [address]   Culture, blood (Routine X 2) w Reflex to ID Panel     Status: None (Preliminary result)   Collection Time: 05/11/24  9:32 PM   Specimen: BLOOD  Result Value Ref Range Status   Specimen Description BLOOD BLOOD RIGHT HAND  Final   Special Requests   Final    BOTTLES DRAWN AEROBIC AND ANAEROBIC Blood Culture results may not be optimal due to an inadequate volume of blood received in culture bottles   Culture   Final    NO GROWTH 3 DAYS Performed at  [facility], [address], [place], [place] [address]    Report Status PENDING  Incomplete  Culture, blood (Routine X 2) w Reflex to ID Panel     Status: None (Preliminary result)   Collection Time: 05/11/24  9:32 PM   Specimen: BLOOD  Result Value Ref Range Status   Specimen Description BLOOD BLOOD RIGHT ARM  Final   Special Requests   Final    BOTTLES DRAWN AEROBIC  AND ANAEROBIC Blood Culture adequate volume   Culture   Final    NO GROWTH 3 DAYS Performed at [facility], [address] [place], [place] [address]    Report Status PENDING  Incomplete    Coagulation Studies: No results for input(s): LABPROT, INR in the last 72 hours.  Urinalysis: Recent Labs    05/11/24 1630  COLORURINE YELLOW*  LABSPEC 1.018  PHURINE 5.0  GLUCOSEU 50*  HGBUR MODERATE*  BILIRUBINUR NEGATIVE  KETONESUR NEGATIVE  PROTEINUR >=300*  NITRITE NEGATIVE  LEUKOCYTESUR LARGE*      Imaging: DG Chest Port 1 View Result Date: 05/12/2024 CLINICAL DATA:  Respiratory failure with hypoxia. Shortness of breath. Facial swelling. EXAM: PORTABLE CHEST 1 VIEW COMPARISON:  05/11/2024 FINDINGS: Stable mildly enlarged cardiac silhouette. Stable small to moderate-sized left pleural effusion, small right pleural effusion and bilateral lower lung zone linear atelectasis/scarring. Stable thoracic spine fixation hardware and post corpectomy hardware. IMPRESSION: 1. Stable small to moderate-sized left pleural effusion, small right pleural effusion and bilateral lower lung zone linear atelectasis/scarring. 2. Stable mild cardiomegaly. Electronically Signed   By: Elspeth Bathe M.D.   On: 05/12/2024 13:36     Medications:    linezolid  (ZYVOX ) IV 600 mg (05/14/24 0855)      arformoterol   15 mcg Nebulization BID   bisacodyl   10 mg Oral QHS   bisacodyl   10 mg Rectal Daily   Chlorhexidine  Gluconate Cloth  6 each Topical QHS   vitamin B-12  1,000 mcg Oral Daily   famotidine   10 mg Oral Daily    feeding supplement  237 mL Oral BID BM   folic acid   1 mg Oral Daily   gabapentin   300 mg Oral QHS   heparin  injection (subcutaneous)  5,000 Units Subcutaneous Q8H   insulin  aspart  0-15 Units Subcutaneous TID WC   insulin  aspart  4 Units Subcutaneous TID WC   insulin  glargine  14 Units Subcutaneous BID   ipratropium-albuterol   3 mL Nebulization BID   levothyroxine   175 mcg Oral Q0600   linaclotide   145 mcg Oral QAC breakfast   linagliptin   5 mg Oral Daily   melatonin  10 mg Oral QHS   metoCLOPramide   5 mg Oral TID AC   midodrine   10 mg Oral TID WC   patiromer   16.8 g Oral Daily   polyethylene glycol  17 g Oral BID   sodium chloride  HYPERTONIC  4 mL Nebulization BID   Vitamin D  (Ergocalciferol )  50,000 Units Oral Q7 days   acetaminophen  **OR** acetaminophen , albuterol , hydrALAZINE  **OR** hydrALAZINE , metoprolol  tartrate, ondansetron  **OR** ondansetron  (ZOFRAN ) IV, oxyCODONE , traZODone   Assessment/ Plan:  Eugene [name] is a 63 y.o.  male  with past medical conditions including diabetes, bilateral BKA, suprapubic catheter, hepatitis C, who was admitted to [facility] on 04/14/2024 for Suprapubic catheter (HCC) [Z93.59] AKI (acute kidney injury) (HCC) [N17.9] Acute kidney injury (HCC) [N17.9]    Acute kidney injury with proteinuria chronic kidney disease stage IV, creatinine 3.74 at time of previous discharge.  Creatinine 4.19 on admission.  Etiology of AKI unclear at this time.  Concerning for progression of kidney.   Creatinine remains elevated, likely hypotensive component. Continues to have excellent urine output. No immediate need for dialysis. Will continue to monitor.    Lab Results  Component Value Date   CREATININE 4.73 (H) 05/14/2024   CREATININE 4.71 (H) 05/13/2024   CREATININE 4.53 (H) 05/12/2024     Intake/Output Summary (Last 24 hours) at 05/14/2024 1228  Last data filed at 05/14/2024 1042 Gross per 24 hour  Intake 1180 ml  Output 2550 ml  Net -1370 ml       2.   Hypertension with chronic kidney disease.  Blood pressure has recovered.  Currently taken off of all antihypertensive and on midodrine .  3.  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 Urine protein to creatinine ratio of 15.4 via Foley specimen.  Proteinuria likely secondary to diabetic nephropathy.  Hemoglobin A1c of 6.0% from 04/14/2024.  Previously diabetes control was suboptimal.  Glucose well controlled  4. Anemia of chronic kidney disease Lab Results  Component Value Date   HGB 8.5 (L) 05/14/2024    Hemoglobin 8.5.  Consider starting the patient on Epogen if hemoglobin continues to trend down.  5. Hyperkalemia, potassium 5.7 today. Will order Veltassa  16.8g daily.    LOS: 11 Luree Palla 8/21/202512:28 PM

## 2024-05-15 DIAGNOSIS — N049 Nephrotic syndrome with unspecified morphologic changes: Secondary | ICD-10-CM | POA: Diagnosis not present

## 2024-05-15 LAB — CBC
HCT: 26.9 % — ABNORMAL LOW (ref 39.0–52.0)
Hemoglobin: 8.4 g/dL — ABNORMAL LOW (ref 13.0–17.0)
MCH: 29.1 pg (ref 26.0–34.0)
MCHC: 31.2 g/dL (ref 30.0–36.0)
MCV: 93.1 fL (ref 80.0–100.0)
Platelets: 136 K/uL — ABNORMAL LOW (ref 150–400)
RBC: 2.89 MIL/uL — ABNORMAL LOW (ref 4.22–5.81)
RDW: 12.4 % (ref 11.5–15.5)
WBC: 4.7 K/uL (ref 4.0–10.5)
nRBC: 0 % (ref 0.0–0.2)

## 2024-05-15 LAB — RENAL FUNCTION PANEL
Albumin: 1.7 g/dL — ABNORMAL LOW (ref 3.5–5.0)
Anion gap: 10 (ref 5–15)
BUN: 68 mg/dL — ABNORMAL HIGH (ref 8–23)
CO2: 26 mmol/L (ref 22–32)
Calcium: 7.1 mg/dL — ABNORMAL LOW (ref 8.9–10.3)
Chloride: 105 mmol/L (ref 98–111)
Creatinine, Ser: 4.67 mg/dL — ABNORMAL HIGH (ref 0.61–1.24)
GFR, Estimated: 13 mL/min — ABNORMAL LOW (ref >60)
Glucose, Bld: 195 mg/dL — ABNORMAL HIGH (ref 70–99)
Phosphorus: 5.1 mg/dL — ABNORMAL HIGH (ref 2.5–4.6)
Potassium: 4.9 mmol/L (ref 3.5–5.1)
Sodium: 141 mmol/L (ref 135–145)

## 2024-05-15 LAB — GLUCOSE, CAPILLARY
Glucose-Capillary: 210 mg/dL — ABNORMAL HIGH (ref 70–99)
Glucose-Capillary: 179 mg/dL — ABNORMAL HIGH (ref 70–99)
Glucose-Capillary: 140 mg/dL — ABNORMAL HIGH (ref 70–99)
Glucose-Capillary: 147 mg/dL — ABNORMAL HIGH (ref 70–99)

## 2024-05-15 MED ORDER — MIDODRINE HCL 5 MG PO TABS
5.0000 mg | ORAL_TABLET | Freq: Three times a day (TID) | ORAL | Status: DC
  Filled 2024-05-15: qty 1

## 2024-05-15 MED ORDER — LINEZOLID 600 MG PO TABS
600.0000 mg | ORAL_TABLET | Freq: Two times a day (BID) | ORAL | Status: DC
  Administered 2024-05-15 – 2024-05-18 (×6): 600 mg via ORAL
  Filled 2024-05-15 (×6): qty 1

## 2024-05-15 NOTE — Progress Notes (Addendum)
 Progress Note   Patient: Eugene [name] FMW:[id number] DOB: 07-22-1961 DOA: 05/02/2024     12 DOS: the patient was seen and examined on 05/15/2024   Brief hospital course:  Medical records reviewed and are as summarized below:   Joshua Soulier is a 63 y.o. male with medical history significant for anxiety, depression, type 2 diabetes mellitus, dyslipidemia, hepatitis C, spinal stenosis, IBS, osteomyelitis, status post bilateral BKA, who presented to the emergency room with acute onset of dyspnea with associated facial and periorbital swelling with associated wheezing. He said he was told by the staff at the nursing home but his face was swollen and he was probably having an allergic reaction.  He is not aware of any recent change in diet or medication that will trigger any allergic reaction.  No fever or chills.  The patient was discharged from here on 7/22 after being managed for AKI, catheter associated UTI, hyperkalemia, ileus and acute hypoxic respiratory failure.      Initial vital signs in the ED temperature 99.1 F, respiratory rate 20, pulse 74, BP 122/82, O2 sat 93% on 6 L of oxygen.   Chest x-ray impression:   Overall, unchanged appearance of the bilateral pleural effusions and mid and lower lung airspace disease, likely atelectasis     He was given 1 dose of EpiPen , IV steroids, IV famotidine  and IV fluids for suspected anaphylaxis.   I assumed care on 05/13/24. Further hospital course and management as outlined below.   Assessment and Plan:  Acute hypoxic respiratory failure:  May have underlying chronic hypoxic respiratory failure and require home O2 inhalation on discharge 8/19 worsening of shortness of breath - transferred to stepdown, required heated high flow oxygen at 40-50 L/min.  Now weaned back to 6 L/min. Patient was febrile, suspected infection started antibiotics. CT chest reviewed. 8/22 -- spO2 stable on 6 L/min past few days.  Turned down to 5 >> 4 L/min  this AM. --Continue weaning oxygen --Continue supplemental O2 target sats > 90% --PCCM consulted --Follow cultures and de-escalate antibiotics --Stop Zosyn , continue linezolid  for now   COPD exacerbation. 8/12 Solu-Medrol  40 mg IV x 2 doses followed by prednisone  40 mg p.o. daily x 2 doses, 30 mg pod x 2 doses CT chest showed pleural effusion and atelectasis 8/12 s/p left thoracentesis 100 mL fluid was tapped.  No WBC/organism, culture negative.  High LDH, low protein, partially exudative. --Continue Brovana  neb BID --Scheduled DuoNeb Q6H    AKI on CKD stage IV, with proteinuria Patient is volume overload secondary to CKD Patient may need hemodialysis in near future --Treated w/ IV Lasix  initially as per nephro --Monitor urine output and renal functions daily  Suprapubic catheter -- noted. Abnormal urine culture -- urine culture from admission growing MRSA, but could be colonization.   --Pt on linezolid  empirically, will continue for now   Fluid overload, most likely due to CKD IV and hypoalbuminemia, Acute on chronic diastolic CHF: S/p IV Lasix  x 1 dose on 05/03/2024.   BNP 172.1. 2D echo on 04/19/2024 showed EF estimated at 55 to 60%, indeterminate LV diastolic parameters, left and right atrial were not assessed, technically difficult study. 8/13 increased Lasix  80 mg IV twice daily  --Nephrology following - appreciate recommendations --Currently off diuretics due to worsened renal function --Given gentle IV fluids per nephrology - now off both fluids and diuretics --Daily weights    Hyperkalemia secondary to renal failure.  Resolved  --Veltassa  daily per nephology  --Monitor BMP  Hypoalbuminemia likely contributing to fluid overload from third-spacing edema.    AKI on CKD stage IV versus progressive CKD stage IV:  Cr has been in mid 4's and stable with baseline Cr around 3.74 s/p Iv albumin   --Nephrology following --Expect need for dialysis in future --Diuretics on  hold with Cr increase   Suspected anaphylactic reaction: S/p EpiPen  on 05/02/2024, IV Pepcid  and IV Solu-Medrol .  Difficult to rule in or exclude anaphylactic reaction at this time. Facial swelling seems more likely due to fluid overload.     Type II DM with hyperglycemia: IV steroids likely contributed to severe hyperglycemia. Hemoglobin A1c is 6.0 on 04/14/2024. 8/14 increased Semglee  18 units twice daily and NovoLog  6 units 3 times daily with meals Hyperglycemia due to steroids --Monitor CBG's & titrate regimen for inpatient goal 140-180     Hx pf Hypertension Acute hypotension on 8/19 Patient developed high blood pressure due to fecal impaction and treated with IV Lopressor .  Patient was on oral Lopressor  which was discontinued on 8/19 due to low BP 8/19 hypotension, started midodrine  10 mg p.o. 3 times daily --Reduce midodrine  10 >> 5 mg TID & wean off as BP tolerates --Monitor BP and titrate medications accordingly     Abdominal discomfort, could be gastroparesis versus ileus CT abdomen pelvis negative for any obstruction 8/19 constipation and fecal impaction caused respiratory distress and tachycardia, patient was transferred to ICU.  CT scan showed fecal impaction.  Patient refused enema on 8/18 8/19 agreed for enema  --Continue bowel regimen - miralax  BID, Senna-S BID, suppository daily     Vitamin D  deficiency: started vitamin D  50,000 units p.o. weekly, follow with PCP to repeat vitamin D  level after 3 to 6 months.   Vitamin B12 level 240, at lower end, goal >400,  started vitamin B12 supplement to prevent deficiency.  Repeat B12 level after 3 to 6 months.   Folic acid  level 6.8, at lower end, started folic acid  1 mg p.o. daily to prevent deficiency.  Repeat folic acid  level after 3 to 6 months.   Comorbidities Peripheral neuropathy, hypertension, hypothyroidism, IBS, bilateral BKA   Recent discharge from the hospital on 05/01/2024 after hospitalization for AKI, catheter  associated UTI, suspected pneumonia, acute hypoxic respiratory failure, gastroparesis, ileus, chronic opioid use       Consultants: Nephrology Pulmonology   Procedures: 8/12 s/p left thoracentesis      Subjective: Pt awake sitting up in bed this AM.  He denies acute complaints.  He asks why we're not giving Lasix  to remove fluid.  Explained about worsened kidney function with attempting diuresis.  Pt continues to express frustration that his reason for being here was for removal of fluid, but we're not doing that.  I explained again that doing so would rapidly worsen kidneys and possibly put him on dialysis very soon.  He perseverates on this topic repeatedly.  I explained the reasoning several times but then I had to leave the room to see other patients.   Physical Exam: Vitals:   05/14/24 2347 05/15/24 0434 05/15/24 0953 05/15/24 1217  BP: (!) 147/59 (!) 158/65 (!) 141/61 (!) 142/70  Pulse: 95 (!) 41 (!) 40 80  Resp: 18 18 20 18   Temp: 98 F (36.7 C) 98.4 F (36.9 C) 98.8 F (37.1 C) 98.6 F (37 C)  TempSrc:  Oral    SpO2: 93% 93% 95% 94%  Weight:      Height:       General exam: sleeping  comfortably, wakes to voice, no acute distress HEENT: moist mucus membranes, hearing grossly normal  Respiratory system: normal respiratory effort at rest on 5 L/min HF O2. Cardiovascular system: normal S1/S2, RRR Gastrointestinal system: soft, NT, ND Central nervous system: no gross focal neurologic deficits, normal speech Extremities: bilateral BKA's Skin: dry, intact, normal temperature Psychiatry: normal mood, congruent affect   Data Reviewed:  Notable labs -- glucose 195, BUN 68, Cr 4.73 >> 4.67, Ca 7.1, albumin  1.7, phos 5.1, Hbg stable 8.4  Family Communication: None present. Will attempt to call as time allows.  Disposition: Status is: Inpatient Remains inpatient appropriate because: weaning oxygen still needing 4-6 L/min   Planned Discharge Destination: return to  prior facility    Time spent: 42 minutes  Author: Burnard DELENA Cunning, DO 05/15/2024 1:39 PM  For on call review [email or web address].

## 2024-05-15 NOTE — Progress Notes (Signed)
 [facility]  ROUNDING NOTE   Subjective:   Eugene [name]  is a 63 y.o.  male  with past medical conditions including diabetes, bilateral BKA, suprapubic catheter, and hepatitis C. Patient presents to ED from SNF with facial edema and has been admitted for Wheezing [R06.2] Anaphylaxis [T78.2XXA] Facial swelling [R22.0] AKI (acute kidney injury) (HCC) [N17.9] Acute on chronic respiratory failure with hypoxia (HCC) [J96.21]  Patient is known to our practice from recent admission. He was released and pending follow up appt in our office at discharge.  Update:  Patient laying in bed Oxygen weaned to 4L Denies pain Appetite appropriate.    08/21 0701 - 08/22 0700 In: 22 [P.O.:22] Out: 2750 [Urine:2750]  Lab Results  Component Value Date   CREATININE 4.67 (H) 05/15/2024   CREATININE 4.73 (H) 05/14/2024   CREATININE 4.71 (H) 05/13/2024     Objective:  Vital signs in last 24 hours:  Temp:  [97.5 F (36.4 C)-98.8 F (37.1 C)] 98.6 F (37 C) (08/22 1217) Pulse Rate:  [40-95] 80 (08/22 1217) Resp:  [18-20] 18 (08/22 1217) BP: (141-158)/(59-70) 142/70 (08/22 1217) SpO2:  [93 %-100 %] 94 % (08/22 1217)  Weight change:  Filed Weights   05/03/24 2039 05/07/24 0600 05/13/24 0600  Weight: (!) 137.6 kg 136 kg 131.8 kg    Intake/Output: I/O last 3 completed shifts: In: 722 [P.O.:22; IV Piggyback:700] Out: 3850 [Urine:3850]   Intake/Output this shift:  Total I/O In: 480 [P.O.:480] Out: -   Physical Exam: General: NAD  Head: Normocephalic  Eyes: Anicteric  Lungs:  Diminished, 4L [place]  Heart: Regular rate   Abdomen:  Distended and firm, obese  Extremities: Trace peripheral edema.,  Bilateral BKA  Neurologic: Awake and alert  Skin: No rashes  Access: None  Suprapubic catheter in place  Basic Metabolic Panel: Recent Labs  Lab 05/09/24 0518 05/10/24 0536 05/11/24 0635 05/11/24 2132 05/12/24 0558 05/13/24 0404 05/14/24 0501 05/15/24 0808  NA 137  136 136 135 139 136 138 141  K 4.8 5.3* 5.8* 5.5* 5.7* 5.4* 5.7* 4.9  CL 105 104 102 101 104 103 104 105  CO2 24 23 24 24 24 25 26 26   GLUCOSE 244* 257* 145* 80 78 151* 93 195*  BUN 75* 79* 77* 82* 80* 83* 77* 68*  CREATININE 4.00* 3.94* 3.91* 4.37* 4.53* 4.71* 4.73* 4.67*  CALCIUM  7.1* 7.2* 7.2* 7.4* 7.0* 7.0* 7.2* 7.1*  MG 1.9 2.0 1.7 1.7 1.7  --   --   --   PHOS 4.6 4.5 3.9  --  4.6  --  6.2* 5.1*    Liver Function Tests: Recent Labs  Lab 05/14/24 0501 05/15/24 0808  ALBUMIN  1.8* 1.7*   No results for input(s): LIPASE, AMYLASE in the last 168 hours.  No results for input(s): AMMONIA in the last 168 hours.  CBC: Recent Labs  Lab 05/11/24 2240 05/12/24 0558 05/13/24 0404 05/14/24 0501 05/15/24 0808  WBC 6.6 8.8 4.6 5.5 4.7  HGB 9.4* 8.4* 8.2* 8.5* 8.4*  HCT 30.1* 27.0* 26.3* 28.2* 26.9*  MCV 93.2 93.8 93.9 96.6 93.1  PLT 137* 125* 118* 124* 136*    Cardiac Enzymes: No results for input(s): CKTOTAL, CKMB, CKMBINDEX, TROPONINI in the last 168 hours.  BNP: Invalid input(s): POCBNP  CBG: Recent Labs  Lab 05/14/24 1233 05/14/24 1809 05/14/24 2004 05/15/24 0835 05/15/24 1215  GLUCAP 127* 162* 156* 210* 179*    Microbiology: Results for orders placed or performed during the hospital encounter of 05/02/24  Resp panel by RT-PCR (RSV, Flu A&B, Covid) Anterior Nasal Swab     Status: None   Collection Time: 05/02/24  8:06 PM   Specimen: Anterior Nasal Swab  Result Value Ref Range Status   SARS Coronavirus 2 by RT PCR NEGATIVE NEGATIVE Final    Comment: (NOTE) SARS-CoV-2 target nucleic acids are NOT DETECTED.  The SARS-CoV-2 RNA is generally detectable in upper respiratory specimens during the acute phase of infection. The lowest concentration of SARS-CoV-2 viral copies this assay can detect is 138 copies/mL. A negative result does not preclude SARS-Cov-2 infection and should not be used as the sole basis for treatment or other patient  management decisions. A negative result may occur with  improper specimen collection/handling, submission of specimen other than nasopharyngeal swab, presence of viral mutation(s) within the areas targeted by this assay, and inadequate number of viral copies(<138 copies/mL). A negative result must be combined with clinical observations, patient history, and epidemiological information. The expected result is Negative.  Fact Sheet for Patients:  BloggerCourse.com  Fact Sheet for Healthcare Providers:  SeriousBroker.it  This test is no t yet approved or cleared by the [place]  FDA and  has been authorized for detection and/or diagnosis of SARS-CoV-2 by FDA under an Emergency Use Authorization (EUA). This EUA will remain  in effect (meaning this test can be used) for the duration of the COVID-19 declaration under Section 564(b)(1) of the Act, 21 U.S.C.section 360bbb-3(b)(1), unless the authorization is terminated  or revoked sooner.       Influenza A by PCR NEGATIVE NEGATIVE Final   Influenza B by PCR NEGATIVE NEGATIVE Final    Comment: (NOTE) The Xpert Xpress SARS-CoV-2/FLU/RSV plus assay is intended as an aid in the diagnosis of influenza from Nasopharyngeal swab specimens and should not be used as a sole basis for treatment. Nasal washings and aspirates are unacceptable for Xpert Xpress SARS-CoV-2/FLU/RSV testing.  Fact Sheet for Patients: BloggerCourse.com  Fact Sheet for Healthcare Providers: SeriousBroker.it  This test is not yet approved or cleared by the [place]  FDA and has been authorized for detection and/or diagnosis of SARS-CoV-2 by FDA under an Emergency Use Authorization (EUA). This EUA will remain in effect (meaning this test can be used) for the duration of the COVID-19 declaration under Section 564(b)(1) of the Act, 21 U.S.C. section 360bbb-3(b)(1),  unless the authorization is terminated or revoked.     Resp Syncytial Virus by PCR NEGATIVE NEGATIVE Final    Comment: (NOTE) Fact Sheet for Patients: BloggerCourse.com  Fact Sheet for Healthcare Providers: SeriousBroker.it  This test is not yet approved or cleared by the [place]  FDA and has been authorized for detection and/or diagnosis of SARS-CoV-2 by FDA under an Emergency Use Authorization (EUA). This EUA will remain in effect (meaning this test can be used) for the duration of the COVID-19 declaration under Section 564(b)(1) of the Act, 21 U.S.C. section 360bbb-3(b)(1), unless the authorization is terminated or revoked.  Performed at [facility], [address] [place], [place] [address]   Body fluid culture w Gram Stain     Status: None   Collection Time: 05/05/24  4:10 PM   Specimen: PATH Cytology Pleural fluid  Result Value Ref Range Status   Specimen Description   Final    FLUID Performed at [facility], [address], [place], [place] [address]    Special Requests   Final    CYTO PLEU Performed at [facility], [address] [place], [place]  [address]    Gram Stain NO WBC SEEN NO ORGANISMS SEEN CYTOSPIN SMEAR   Final   Culture   Final    NO GROWTH 3 DAYS Performed at [facility], [address], [place], [place] [address]    Report Status 05/09/2024 FINAL  Final  Urine Culture (for pregnant, neutropenic or urologic patients or patients with an indwelling urinary catheter)     Status: Abnormal   Collection Time: 05/11/24  5:10 PM   Specimen: Urine, Catheterized  Result Value Ref Range Status   Specimen Description   Final    URINE, CATHETERIZED Performed at [facility], [address] [place], [place] [address]    Special Requests   Final    NONE Performed at [facility], [address] [place], [place] [address]    Culture (A)   Final    >=100,000 COLONIES/mL STAPHYLOCOCCUS AUREUS METHICILLIN RESISTANT STAPHYLOCOCCUS AUREUS    Report Status 05/14/2024 FINAL  Final   Organism ID, Bacteria STAPHYLOCOCCUS AUREUS (A)  Final      Susceptibility   Staphylococcus aureus - MIC*    CIPROFLOXACIN  >=8 RESISTANT Resistant     GENTAMICIN <=0.5 SENSITIVE Sensitive     NITROFURANTOIN <=16 SENSITIVE Sensitive     OXACILLIN >=4 RESISTANT Resistant     TETRACYCLINE <=1 SENSITIVE Sensitive     VANCOMYCIN  1 SENSITIVE Sensitive     TRIMETH /SULFA  <=10 SENSITIVE Sensitive     RIFAMPIN  <=0.5 SENSITIVE Sensitive     Inducible Clindamycin NEGATIVE Sensitive     LINEZOLID  2 SENSITIVE Sensitive     * >=100,000 COLONIES/mL STAPHYLOCOCCUS AUREUS  MRSA Next Gen by PCR, Nasal     Status: Abnormal   Collection Time: 05/11/24  9:08 PM   Specimen: Nasal Mucosa; Nasal Swab  Result Value Ref Range Status   MRSA by PCR Next Gen DETECTED (A) NOT DETECTED Final    Comment: RESULT CALLED TO, READ BACK BY AND VERIFIED WITH:  HOLLY CHANDLER AT 0007 05/12/24 JG (NOTE) The GeneXpert MRSA Assay (FDA approved for NASAL specimens only), is one component of a comprehensive MRSA colonization surveillance program. It is not intended to diagnose MRSA infection nor to guide or monitor treatment for MRSA infections. Test performance is not FDA approved in patients less than 97 years old. Performed at [facility], [address] [place], [place] [address]   Culture, blood (Routine X 2) w Reflex to ID Panel     Status: None (Preliminary result)   Collection Time: 05/11/24  9:32 PM   Specimen: BLOOD  Result Value Ref Range Status   Specimen Description BLOOD BLOOD RIGHT HAND  Final   Special Requests   Final    BOTTLES DRAWN AEROBIC AND ANAEROBIC Blood Culture results may not be optimal due to an inadequate volume of blood received in culture bottles   Culture   Final    NO GROWTH 4 DAYS Performed at [facility], [address] [place], [place] [address]    Report Status PENDING  Incomplete  Culture, blood (Routine X 2) w Reflex to ID Panel     Status: None (Preliminary result)   Collection Time: 05/11/24  9:32 PM   Specimen: BLOOD  Result Value Ref Range Status   Specimen Description BLOOD BLOOD RIGHT ARM  Final   Special Requests   Final    BOTTLES DRAWN AEROBIC AND ANAEROBIC Blood Culture adequate volume   Culture   Final    NO GROWTH  4 DAYS Performed at [facility], [address] [place], [place] [address]    Report Status PENDING  Incomplete    Coagulation Studies: No results for input(s): LABPROT, INR in the last 72 hours.  Urinalysis: No results for input(s): COLORURINE, LABSPEC, PHURINE, GLUCOSEU, HGBUR, BILIRUBINUR, KETONESUR, PROTEINUR, UROBILINOGEN, NITRITE, LEUKOCYTESUR in the last 72 hours.  Invalid input(s): APPERANCEUR     Imaging: No results found.    Medications:        arformoterol   15 mcg Nebulization BID   bisacodyl   10 mg Oral QHS   bisacodyl   10 mg Rectal Daily   Chlorhexidine  Gluconate Cloth  6 each Topical QHS   vitamin B-12  1,000 mcg Oral Daily   famotidine   10 mg Oral Daily   feeding supplement  237 mL Oral BID BM   folic acid   1 mg Oral Daily   gabapentin   300 mg Oral QHS   heparin  injection (subcutaneous)  5,000 Units Subcutaneous Q8H   insulin  aspart  0-15 Units Subcutaneous TID WC   insulin  aspart  4 Units Subcutaneous TID WC   insulin  glargine  14 Units Subcutaneous BID   ipratropium-albuterol   3 mL Nebulization BID   levothyroxine   175 mcg Oral Q0600   linaclotide   145 mcg Oral QAC breakfast   linagliptin   5 mg Oral Daily   linezolid   600 mg Oral Q12H   melatonin  10 mg Oral QHS   metoCLOPramide   5 mg Oral TID AC   midodrine   10 mg Oral TID WC   patiromer   16.8 g Oral Daily   polyethylene glycol  17 g Oral BID   senna-docusate  1 tablet Oral BID   Vitamin D  (Ergocalciferol )  50,000 Units Oral Q7 days    acetaminophen  **OR** acetaminophen , albuterol , hydrALAZINE  **OR** hydrALAZINE , metoprolol  tartrate, ondansetron  **OR** ondansetron  (ZOFRAN ) IV, oxyCODONE , sodium chloride  flush, traZODone   Assessment/ Plan:  [name] is a 63 y.o.  male  with past medical conditions including diabetes, bilateral BKA, suprapubic catheter, hepatitis C, who was admitted to [facility] on 04/14/2024 for Suprapubic catheter (HCC) [Z93.59] AKI (acute kidney injury) (HCC) [N17.9] Acute kidney injury (HCC) [N17.9]    Acute kidney injury with proteinuria chronic kidney disease stage IV, creatinine 3.74 at time of previous discharge.  Creatinine 4.19 on admission.  Etiology of AKI unclear at this time.  Concerning for progression of kidney.   Creatinine remains elevated. Good urine output. No immediate need for dialysis. Will continue to monitor.    Lab Results  Component Value Date   CREATININE 4.67 (H) 05/15/2024   CREATININE 4.73 (H) 05/14/2024   CREATININE 4.71 (H) 05/13/2024     Intake/Output Summary (Last 24 hours) at 05/15/2024 1346 Last data filed at 05/15/2024 0900 Gross per 24 hour  Intake 502 ml  Output 1250 ml  Net -748 ml       2.  Hypertension with chronic kidney disease.  Blood pressure has recovered.  Currently taken off of all antihypertensive and on midodrine . Blood pressure acceptable for this patient.   3.  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 Urine protein to creatinine ratio of 15.4 via Foley specimen.  Proteinuria likely secondary to diabetic nephropathy.  Hemoglobin A1c of 6.0% from 04/14/2024.  Previously diabetes control was suboptimal.  Glucose well controlled  4. Anemia of chronic kidney disease Lab Results  Component Value Date   HGB 8.4 (L) 05/15/2024    Hemoglobin 8.4.  Hgb stable. Will monitor levels  and consider ESA  5. Hyperkalemia, potassium corrected with [place] , continue daily   LOS: 12 Diesha Rostad 8/22/20251:46 PM

## 2024-05-16 DIAGNOSIS — N049 Nephrotic syndrome with unspecified morphologic changes: Secondary | ICD-10-CM | POA: Diagnosis not present

## 2024-05-16 LAB — CULTURE, BLOOD (ROUTINE X 2)
Culture: NO GROWTH
Culture: NO GROWTH
Special Requests: ADEQUATE

## 2024-05-16 LAB — RENAL FUNCTION PANEL
Albumin: 1.7 g/dL — ABNORMAL LOW (ref 3.5–5.0)
Anion gap: 9 (ref 5–15)
BUN: 61 mg/dL — ABNORMAL HIGH (ref 8–23)
CO2: 24 mmol/L (ref 22–32)
Calcium: 7.2 mg/dL — ABNORMAL LOW (ref 8.9–10.3)
Chloride: 106 mmol/L (ref 98–111)
Creatinine, Ser: 4.62 mg/dL — ABNORMAL HIGH (ref 0.61–1.24)
GFR, Estimated: 14 mL/min — ABNORMAL LOW (ref >60)
Glucose, Bld: 149 mg/dL — ABNORMAL HIGH (ref 70–99)
Phosphorus: 4.9 mg/dL — ABNORMAL HIGH (ref 2.5–4.6)
Potassium: 4.7 mmol/L (ref 3.5–5.1)
Sodium: 139 mmol/L (ref 135–145)

## 2024-05-16 LAB — GLUCOSE, CAPILLARY
Glucose-Capillary: 181 mg/dL — ABNORMAL HIGH (ref 70–99)
Glucose-Capillary: 150 mg/dL — ABNORMAL HIGH (ref 70–99)
Glucose-Capillary: 140 mg/dL — ABNORMAL HIGH (ref 70–99)
Glucose-Capillary: 115 mg/dL — ABNORMAL HIGH (ref 70–99)

## 2024-05-16 MED ORDER — FOLIC ACID 1 MG PO TABS
1.0000 mg | ORAL_TABLET | Freq: Every day | ORAL | Status: AC
Start: 2024-05-17 — End: ?

## 2024-05-16 MED ORDER — CYANOCOBALAMIN 1000 MCG PO TABS: 1000.0000 ug | ORAL_TABLET | Freq: Every day | ORAL | Status: AC

## 2024-05-16 MED ORDER — PATIROMER SORBITEX CALCIUM 8.4 G PO PACK: 16.8000 g | PACK | Freq: Every day | ORAL | 0 refills | Status: DC

## 2024-05-16 MED ORDER — FAMOTIDINE 10 MG PO TABS: 10.0000 mg | ORAL_TABLET | Freq: Every day | ORAL | Status: AC

## 2024-05-16 MED ORDER — LINEZOLID 600 MG PO TABS: 600.0000 mg | ORAL_TABLET | Freq: Two times a day (BID) | ORAL | 0 refills | Status: AC

## 2024-05-16 MED ORDER — BISACODYL 5 MG PO TBEC: 10.0000 mg | DELAYED_RELEASE_TABLET | Freq: Every day | ORAL | Status: DC

## 2024-05-16 MED ORDER — ENSURE PLUS HIGH PROTEIN PO LIQD: 237.0000 mL | Freq: Two times a day (BID) | ORAL | Status: DC

## 2024-05-16 MED ORDER — OXYCODONE HCL 10 MG PO TABS: 5.0000 mg | ORAL_TABLET | Freq: Four times a day (QID) | ORAL | 0 refills | Status: DC | PRN

## 2024-05-16 MED ORDER — VITAMIN D (ERGOCALCIFEROL) 1.25 MG (50000 UNIT) PO CAPS: 50000.0000 [IU] | ORAL_CAPSULE | ORAL | Status: AC

## 2024-05-16 MED ORDER — FUROSEMIDE 40 MG PO TABS
40.0000 mg | ORAL_TABLET | Freq: Every day | ORAL | Status: DC
  Administered 2024-05-16 – 2024-05-18 (×3): 40 mg via ORAL
  Filled 2024-05-16 (×3): qty 1

## 2024-05-16 NOTE — Progress Notes (Signed)
 Dr. Signa notified patient refused AM lab draw due to unable to pull off off PICC. PICC line flushes without any problem, no return. PICC?IV consult notified.

## 2024-05-16 NOTE — TOC Progression Note (Signed)
 Transition of Care [facility]) - Progression Note    Patient Details  Name: Eugene [name] MRN: [id number] Date of Birth: 1960/10/23  [facility] CM/SW Contact  Marinda Cooks, RN Phone Number: 05/16/2024, 10:43 AM  Clinical Narrative:    This CM updated by covering MD pt medically cleared to dc today. This CM called and spoke with Admission Liaison at [facility] and informed pt unable to be rec'd at facility. This CM updated medically team. TOC will cont to follow dc planning / care coordination and update as applicable.    Expected Discharge Plan: Skilled Nursing Facility Barriers to Discharge: Continued Medical Work up               Expected Discharge Plan and Services     Post Acute Care Choice: Resumption of Svcs/PTA Provider Living arrangements for the past 2 months: Skilled Nursing Facility Expected Discharge Date: 05/16/24                                     Social Drivers of Health (SDOH) Interventions SDOH Screenings   Food Insecurity: Patient Declined (05/03/2024)  Housing: Low Risk  (05/03/2024)  Transportation Needs: No Transportation Needs (05/03/2024)  Financial Resource Strain: Medium Risk (02/02/2019)  Physical Activity: Unknown (02/02/2019)  Social Connections: Somewhat Isolated (02/02/2019)  Stress: Stress Concern Present (02/02/2019)  Tobacco Use: High Risk (05/02/2024)    Readmission Risk Interventions    05/05/2024    3:05 PM  Readmission Risk Prevention Plan  Transportation Screening Complete  Medication Review (RN Care Manager) Complete  PCP or Specialist appointment within 3-5 days of discharge Complete  SW Recovery Care/Counseling Consult Complete  Palliative Care Screening Not Applicable  Skilled Nursing Facility Complete

## 2024-05-16 NOTE — Plan of Care (Signed)
   Problem: Coping: Goal: Ability to adjust to condition or change in health will improve Outcome: Progressing   Problem: Nutritional: Goal: Maintenance of adequate nutrition will improve Outcome: Progressing   Problem: Tissue Perfusion: Goal: Adequacy of tissue perfusion will improve Outcome: Progressing

## 2024-05-16 NOTE — Progress Notes (Signed)
 [facility]  ROUNDING NOTE   Subjective:   Eugene [name]  is a 63 y.o.  male  with past medical conditions including diabetes, bilateral BKA, suprapubic catheter, and hepatitis C. Patient [facility] ED from [facility] with facial edema and has been admitted for Wheezing [R06.2] Anaphylaxis [T78.2XXA] Facial swelling [R22.0] AKI (acute kidney injury) (HCC) [N17.9] Acute on chronic respiratory failure with hypoxia (HCC) [J96.21]  Patient is known to our practice from recent admission. He was released and pending follow up appt in our office at discharge.  Update:  Patient has no complaints. Holding diuretics. No new labs today. UOP .    08/22 0701 - 08/23 0700 In: 1680 [P.O.:1680] Out: 1250 [Urine:1250]  Lab Results  Component Value Date   CREATININE 4.67 (H) 05/15/2024   CREATININE 4.73 (H) 05/14/2024   CREATININE 4.71 (H) 05/13/2024     Objective:  Vital signs in last 24 hours:  Temp:  [98.1 F (36.7 C)-98.6 F (37 C)] 98.2 F (36.8 C) (08/23 0826) Pulse Rate:  [40-92] 90 (08/23 0826) Resp:  [18] 18 (08/23 0541) BP: (142-180)/(49-72) 148/69 (08/23 0826) SpO2:  [91 %-95 %] 91 % (08/23 0826) Weight:  [126.4 kg] 126.4 kg (08/23 0500)  Weight change:  Filed Weights   05/07/24 0600 05/13/24 0600 05/16/24 0500  Weight: 136 kg 131.8 kg 126.4 kg    Intake/Output: I/O last 3 completed shifts: In: 1680 [P.O.:1680] Out: 2500 [Urine:2500]   Intake/Output this shift:  Total I/O In: -  Out: 250 [Urine:250]  Physical Exam: General: NAD, laying in bed  Head: Normocephalic  Eyes: Anicteric  Lungs:  Diminished, 3L [place]  Heart: Regular rate   Abdomen:  Distended and firm, obese  Extremities: Trace peripheral edema.,  Bilateral BKA  Neurologic: Awake and alert  Skin: No rashes  Access: None  Suprapubic catheter in place  Basic Metabolic Panel: Recent Labs  Lab 05/10/24 0536 05/11/24 0635 05/11/24 2132 05/12/24 0558 05/13/24 0404 05/14/24 0501  05/15/24 0808  NA 136 136 135 139 136 138 141  K 5.3* 5.8* 5.5* 5.7* 5.4* 5.7* 4.9  CL 104 102 101 104 103 104 105  CO2 23 24 24 24 25 26 26   GLUCOSE 257* 145* 80 78 151* 93 195*  BUN 79* 77* 82* 80* 83* 77* 68*  CREATININE 3.94* 3.91* 4.37* 4.53* 4.71* 4.73* 4.67*  CALCIUM  7.2* 7.2* 7.4* 7.0* 7.0* 7.2* 7.1*  MG 2.0 1.7 1.7 1.7  --   --   --   PHOS 4.5 3.9  --  4.6  --  6.2* 5.1*    Liver Function Tests: Recent Labs  Lab 05/14/24 0501 05/15/24 0808  ALBUMIN  1.8* 1.7*   No results for input(s): LIPASE, AMYLASE in the last 168 hours.  No results for input(s): AMMONIA in the last 168 hours.  CBC: Recent Labs  Lab 05/11/24 2240 05/12/24 0558 05/13/24 0404 05/14/24 0501 05/15/24 0808  WBC 6.6 8.8 4.6 5.5 4.7  HGB 9.4* 8.4* 8.2* 8.5* 8.4*  HCT 30.1* 27.0* 26.3* 28.2* 26.9*  MCV 93.2 93.8 93.9 96.6 93.1  PLT 137* 125* 118* 124* 136*    Cardiac Enzymes: No results for input(s): CKTOTAL, CKMB, CKMBINDEX, TROPONINI in the last 168 hours.  BNP: Invalid input(s): POCBNP  CBG: Recent Labs  Lab 05/15/24 0835 05/15/24 1215 05/15/24 1544 05/15/24 2124 05/16/24 0824  GLUCAP 210* 179* 140* 147* 181*    Microbiology: Results for orders placed or performed during the hospital encounter of 05/02/24  Resp panel by  RT-PCR (RSV, Flu A&B, Covid) Anterior Nasal Swab     Status: None   Collection Time: 05/02/24  8:06 PM   Specimen: Anterior Nasal Swab  Result Value Ref Range Status   SARS Coronavirus 2 by RT PCR NEGATIVE NEGATIVE Final    Comment: (NOTE) SARS-CoV-2 target nucleic acids are NOT DETECTED.  The SARS-CoV-2 RNA is generally detectable in upper respiratory specimens during the acute phase of infection. The lowest concentration of SARS-CoV-2 viral copies this assay can detect is 138 copies/mL. A negative result does not preclude SARS-Cov-2 infection and should not be used as the sole basis for treatment or other patient management decisions. A  negative result may occur with  improper specimen collection/handling, submission of specimen other than nasopharyngeal swab, presence of viral mutation(s) within the areas targeted by this assay, and inadequate number of viral copies(<138 copies/mL). A negative result must be combined with clinical observations, patient history, and epidemiological information. The expected result is Negative.  Fact Sheet for Patients:  BloggerCourse.com  Fact Sheet for Healthcare Providers:  SeriousBroker.it  This test is no t yet approved or cleared by the [place]  FDA and  has been authorized for detection and/or diagnosis of SARS-CoV-2 by FDA under an Emergency Use Authorization (EUA). This EUA will remain  in effect (meaning this test can be used) for the duration of the COVID-19 declaration under Section 564(b)(1) of the Act, 21 U.S.C.section 360bbb-3(b)(1), unless the authorization is terminated  or revoked sooner.       Influenza A by PCR NEGATIVE NEGATIVE Final   Influenza B by PCR NEGATIVE NEGATIVE Final    Comment: (NOTE) The Xpert Xpress SARS-CoV-2/FLU/RSV plus assay is intended as an aid in the diagnosis of influenza from Nasopharyngeal swab specimens and should not be used as a sole basis for treatment. Nasal washings and aspirates are unacceptable for Xpert Xpress SARS-CoV-2/FLU/RSV testing.  Fact Sheet for Patients: BloggerCourse.com  Fact Sheet for Healthcare Providers: SeriousBroker.it  This test is not yet approved or cleared by the [place]  FDA and has been authorized for detection and/or diagnosis of SARS-CoV-2 by FDA under an Emergency Use Authorization (EUA). This EUA will remain in effect (meaning this test can be used) for the duration of the COVID-19 declaration under Section 564(b)(1) of the Act, 21 U.S.C. section 360bbb-3(b)(1), unless the authorization  is terminated or revoked.     Resp Syncytial Virus by PCR NEGATIVE NEGATIVE Final    Comment: (NOTE) Fact Sheet for Patients: BloggerCourse.com  Fact Sheet for Healthcare Providers: SeriousBroker.it  This test is not yet approved or cleared by the [place]  FDA and has been authorized for detection and/or diagnosis of SARS-CoV-2 by FDA under an Emergency Use Authorization (EUA). This EUA will remain in effect (meaning this test can be used) for the duration of the COVID-19 declaration under Section 564(b)(1) of the Act, 21 U.S.C. section 360bbb-3(b)(1), unless the authorization is terminated or revoked.  Performed at [facility], [address] [place], [place] [address]   Body fluid culture w Gram Stain     Status: None   Collection Time: 05/05/24  4:10 PM   Specimen: PATH Cytology Pleural fluid  Result Value Ref Range Status   Specimen Description   Final    FLUID Performed at [facility], [address], [place], [place] [address]    Special Requests   Final    CYTO PLEU Performed at [facility], [address] [place], [place] [address]  Gram Stain NO WBC SEEN NO ORGANISMS SEEN CYTOSPIN SMEAR   Final   Culture   Final    NO GROWTH 3 DAYS Performed at [facility], [address], [place], [place] [address]    Report Status 05/09/2024 FINAL  Final  Urine Culture (for pregnant, neutropenic or urologic patients or patients with an indwelling urinary catheter)     Status: Abnormal   Collection Time: 05/11/24  5:10 PM   Specimen: Urine, Catheterized  Result Value Ref Range Status   Specimen Description   Final    URINE, CATHETERIZED Performed at [facility], [address] [place], [place] [address]    Special Requests   Final    NONE Performed at [facility], [address] [place], [place] [address]    Culture (A)  Final    >=100,000  COLONIES/mL STAPHYLOCOCCUS AUREUS METHICILLIN RESISTANT STAPHYLOCOCCUS AUREUS    Report Status 05/14/2024 FINAL  Final   Organism ID, Bacteria STAPHYLOCOCCUS AUREUS (A)  Final      Susceptibility   Staphylococcus aureus - MIC*    CIPROFLOXACIN  >=8 RESISTANT Resistant     GENTAMICIN <=0.5 SENSITIVE Sensitive     NITROFURANTOIN <=16 SENSITIVE Sensitive     OXACILLIN >=4 RESISTANT Resistant     TETRACYCLINE <=1 SENSITIVE Sensitive     VANCOMYCIN  1 SENSITIVE Sensitive     TRIMETH /SULFA  <=10 SENSITIVE Sensitive     RIFAMPIN  <=0.5 SENSITIVE Sensitive     Inducible Clindamycin NEGATIVE Sensitive     LINEZOLID  2 SENSITIVE Sensitive     * >=100,000 COLONIES/mL STAPHYLOCOCCUS AUREUS  MRSA Next Gen by PCR, Nasal     Status: Abnormal   Collection Time: 05/11/24  9:08 PM   Specimen: Nasal Mucosa; Nasal Swab  Result Value Ref Range Status   MRSA by PCR Next Gen DETECTED (A) NOT DETECTED Final    Comment: RESULT CALLED TO, READ BACK BY AND VERIFIED WITH:  HOLLY CHANDLER AT 0007 05/12/24 JG (NOTE) The GeneXpert MRSA Assay (FDA approved for NASAL specimens only), is one component of a comprehensive MRSA colonization surveillance program. It is not intended to diagnose MRSA infection nor to guide or monitor treatment for MRSA infections. Test performance is not FDA approved in patients less than 40 years old. Performed at [facility], [address] [place], [place] [address]   Culture, blood (Routine X 2) w Reflex to ID Panel     Status: None   Collection Time: 05/11/24  9:32 PM   Specimen: BLOOD  Result Value Ref Range Status   Specimen Description BLOOD BLOOD RIGHT HAND  Final   Special Requests   Final    BOTTLES DRAWN AEROBIC AND ANAEROBIC Blood Culture results may not be optimal due to an inadequate volume of blood received in culture bottles   Culture   Final    NO GROWTH 5 DAYS Performed at [facility], [address], [place], [place] [address]    Report  Status 05/16/2024 FINAL  Final  Culture, blood (Routine X 2) w Reflex to ID Panel     Status: None   Collection Time: 05/11/24  9:32 PM   Specimen: BLOOD  Result Value Ref Range Status   Specimen Description BLOOD BLOOD RIGHT ARM  Final   Special Requests   Final    BOTTLES DRAWN AEROBIC AND ANAEROBIC Blood Culture adequate volume   Culture   Final    NO GROWTH 5 DAYS Performed at [facility],  [address], [place], [place] [address]    Report Status 05/16/2024 FINAL  Final    Coagulation Studies: No results for input(s): LABPROT, INR in the last 72 hours.  Urinalysis: No results for input(s): COLORURINE, LABSPEC, PHURINE, GLUCOSEU, HGBUR, BILIRUBINUR, KETONESUR, PROTEINUR, UROBILINOGEN, NITRITE, LEUKOCYTESUR in the last 72 hours.  Invalid input(s): APPERANCEUR     Imaging: No results found.    Medications:        arformoterol   15 mcg Nebulization BID   bisacodyl   10 mg Oral QHS   bisacodyl   10 mg Rectal Daily   Chlorhexidine  Gluconate Cloth  6 each Topical QHS   vitamin B-12  1,000 mcg Oral Daily   famotidine   10 mg Oral Daily   feeding supplement  237 mL Oral BID BM   folic acid   1 mg Oral Daily   furosemide   40 mg Oral Daily   gabapentin   300 mg Oral QHS   heparin  injection (subcutaneous)  5,000 Units Subcutaneous Q8H   insulin  aspart  0-15 Units Subcutaneous TID WC   insulin  aspart  4 Units Subcutaneous TID WC   insulin  glargine  14 Units Subcutaneous BID   ipratropium-albuterol   3 mL Nebulization BID   levothyroxine   175 mcg Oral Q0600   linaclotide   145 mcg Oral QAC breakfast   linagliptin   5 mg Oral Daily   linezolid   600 mg Oral Q12H   melatonin  10 mg Oral QHS   metoCLOPramide   5 mg Oral TID AC   midodrine   5 mg Oral TID WC   patiromer   16.8 g Oral Daily   polyethylene glycol  17 g Oral BID   senna-docusate  1 tablet Oral BID   Vitamin D  (Ergocalciferol )  50,000 Units Oral Q7 days   acetaminophen  **OR**  acetaminophen , albuterol , hydrALAZINE  **OR** hydrALAZINE , metoprolol  tartrate, ondansetron  **OR** ondansetron  (ZOFRAN ) IV, oxyCODONE , sodium chloride  flush, traZODone   Assessment/ Plan:  Mr. Friend Dorfman is a 62 y.o.  male  with past medical conditions including diabetes, bilateral BKA, suprapubic catheter, hepatitis C, who was admitted to [facility] on 05/02/2024 for Wheezing [R06.2] Anaphylaxis [T78.2XXA] Facial swelling [R22.0] AKI (acute kidney injury) (HCC) [N17.9] Acute on chronic respiratory failure with hypoxia (HCC) [J96.21]    Acute kidney injury with proteinuria and hyperkalemia. On chronic kidney disease stage IV, creatinine 3.74, GFR of 18 on 04/30/24 at time of previous discharge.   Etiology of AKI unclear at this time.  Concerning for progression of kidney.   Creatinine remains elevated. Good urine output. No indication for dialysis.   Given patiromer  for potassium.    Lab Results  Component Value Date   CREATININE 4.67 (H) 05/15/2024   CREATININE 4.73 (H) 05/14/2024   CREATININE 4.71 (H) 05/13/2024     Intake/Output Summary (Last 24 hours) at 05/16/2024 1052 Last data filed at 05/16/2024 0827 Gross per 24 hour  Intake 1200 ml  Output 1500 ml  Net -300 ml       2.  Hypertension with chronic kidney disease.  Blood pressure has recovered. 148/69  Currently taken off of all antihypertensive  Started on midodrine  -anticipate restarting diuretics.   3.  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 Urine protein to creatinine ratio of 15.4 via Foley specimen.  Proteinuria likely secondary to diabetic nephropathy.  Hemoglobin A1c of 6.0% from 04/14/2024.  Previously diabetes control was suboptimal.  Glucose well controlled  4. Anemia of chronic kidney disease Lab Results  Component Value Date   HGB 8.4 (  L) 05/15/2024    Hemoglobin 8.4.  Hgb stable. Will monitor levels and consider ESA. Iron  levels on 8/1 are  acceptable.    LOS: 13 Cammeron Greis 8/23/202510:52 AM

## 2024-05-16 NOTE — Discharge Summary (Signed)
 Physician Discharge Summary   Patient: Eugene [name] MRN: [id number] DOB: [birth year]  Admit date:     05/02/2024  Discharge date: 05/18/2024  Discharge Physician: Burnard DELENA Cunning   PCP: Care, [facility]   Recommendations at discharge:   Follow up closely with Nephrology Repeat CBC, BMP, Mg in 1 week Follow up with primary care in 1-2 weeks  Discharge Diagnoses: Principal Problem:   Anasarca associated with disorder of kidney Active Problems:   Anaphylaxis   Type 2 diabetes mellitus with peripheral neuropathy (HCC)   Essential hypertension   Hypothyroidism   Acute on chronic respiratory failure with hypoxia (HCC)   IBS (irritable bowel syndrome)   Acute on chronic diastolic CHF (congestive heart failure) (HCC)   CKD (chronic kidney disease) stage 4, GFR 15-29 ml/min (HCC)  Resolved Problems:   * No resolved hospital problems. [facility] Course:  Medical records reviewed and are as summarized below:   Eugene [name] is a 63 y.o. male with medical history significant for anxiety, depression, type 2 diabetes mellitus, dyslipidemia, hepatitis C, spinal stenosis, IBS, osteomyelitis, status post bilateral BKA, who presented to the emergency room with acute onset of dyspnea with associated facial and periorbital swelling with associated wheezing. He said he was told by the staff at the nursing home but his face was swollen and he was probably having an allergic reaction.  He is not aware of any recent change in diet or medication that will trigger any allergic reaction.  No fever or chills.  The patient was discharged from here on 7/22 after being managed for AKI, catheter associated UTI, hyperkalemia, ileus and acute hypoxic respiratory failure.      Initial vital signs in the ED temperature 99.1 F, respiratory rate 20, pulse 74, BP 122/82, O2 sat 93% on 6 L of oxygen.   Chest x-ray impression:   Overall, unchanged appearance of the bilateral pleural effusions and mid  and lower lung airspace disease, likely atelectasis     He was given 1 dose of EpiPen , IV steroids, IV famotidine  and IV fluids for suspected anaphylaxis.     I assumed care on 05/13/24. Further hospital course and management as outlined below.   Due to worsening renal function, diuresis had to be stopped, but patient's volume status improved and facial edema resolved.  From 8/20 through today, patient has been weaned down on oxygen from 6 L/min to 3 L/min with stable oxygen sats.    8/23 - patient is clinically improved, cleared by consultants and is medically stable for discharge back to his facility today.  Of note, pt refused lab draw this AM.  Overall, labs have been stable with Cr above baseline but stable.  Nephrology agrees with discharge and resuming patient's home PO Lasix .  They will see patient in follow up.  8/25 -- d/c on 8/23 was cancelled because facility was not able to accept pt back.  He remained medically stable over the weekend and ready for discharge today.    Assessment and Plan:  Acute hypoxic respiratory failure: improved May have underlying chronic hypoxic respiratory failure and require home O2 inhalation on discharge 8/19 worsening of shortness of breath - transferred to stepdown, required heated high flow oxygen at 40-50 L/min.  Now weaned back to 6 L/min. Patient was febrile, suspected infection started antibiotics. CT chest reviewed. 8/22 -- spO2 stable on 6 L/min past few days.  Turned down to 5 >> 4 L/min this AM. 8/23 -- weaned to 3 L/min [place]  O2  --Continue supplemental O2 target sats > 90% --PCCM consulted, recs aprpeciated --Follow cultures and de-escalate antibiotics --Stop Zosyn , continue linezolid  for now   COPD exacerbation. 8/12 Solu-Medrol  40 mg IV x 2 doses followed by prednisone  40 mg p.o. daily x 2 doses, 30 mg pod x 2 doses CT chest showed pleural effusion and atelectasis 8/12 s/p left thoracentesis 100 mL fluid was tapped.  No  WBC/organism, culture negative.  High LDH, low protein, partially exudative. --Continue Brovana  neb BID --Scheduled DuoNeb Q6H    AKI on CKD stage IV, with proteinuria Patient presented volume overload secondary to CKD AKI from diuresis versus progression of CKD. Baseline Cr recently around 3.7. Cr elevated to mid-4's but stable several days. --Treated w/ IV Lasix  initially as per nephro, then held with worsening renal parameters. Pt has maintained good urine output --Resume home PO Lasix  40 mg daily on discharge --Monitor urine output -- notify nephrology if urine output becomes diminished --Close outpatient follow up with Nephrology --Pt is high risk of needing dialysis in future   Suprapubic catheter due to neurogenic bladder -- noted. Abnormal urine culture -- urine culture from admission growing MRSA, but could be colonization.   Treated for CAUTI last admission, recently. --Pt on linezolid  empirically as above   Fluid overload, most likely due to CKD IV and hypoalbuminemia, Acute on chronic diastolic CHF: S/p IV Lasix  x 1 dose on 05/03/2024.   BNP 172.1. 2D echo on 04/19/2024 showed EF estimated at 55 to 60%, indeterminate LV diastolic parameters, left and right atrial were not assessed, technically difficult study. 8/13 increased Lasix  80 mg IV twice daily  --Nephrology following - appreciate recommendations --IV diuresis was stopped due to worsened renal function. Pt treated with gentle IV hydration per neprhology --Okay to resume home Lasix  PO on discharge --Daily weights & monitoring of urine output     Hyperkalemia secondary to renal failure.  Resolved  --Veltassa  daily per nephology  --Monitor BMP in follow up     Hypoalbuminemia likely contributing to fluid overload from third-spacing edema.       Suspected anaphylactic reaction: S/p EpiPen  on 05/02/2024, IV Pepcid  and IV Solu-Medrol .  Difficult to rule in or exclude anaphylactic reaction at this time. Facial swelling  seems more likely due to fluid overload.     Type II DM with hyperglycemia: IV steroids likely contributed to severe hyperglycemia. Hemoglobin A1c is 6.0 on 04/14/2024. --Resume home regimen on discharge     Hx pf Hypertension Acute hypotension on 8/19 - resolved Patient developed high blood pressure due to fecal impaction and treated with IV Lopressor .  Patient was on oral Lopressor  which was discontinued on 8/19 due to low BP 8/19 hypotension, started midodrine  10 mg p.o. 3 times daily --Stop midodrine  now that BP's are elevated --Resume home metoprolol  and Lasix  on discharge --PCP follow up    Abdominal discomfort, History of gastroparesis  CT abdomen pelvis negative for any obstruction 8/19 constipation and fecal impaction caused respiratory distress and tachycardia, patient was transferred to ICU.  CT scan showed fecal impaction.  Patient refused enema on 8/18, agreed for enema 8/19. Fecal impaction resolved.  Pt having BM's --Continue bowel regimen - miralax  BID, Senna-S BID, suppository daily --Continue Reglan  --Pt advised to eat small frequent meals, low fiber, low fat for better tolerance     Vitamin D  deficiency: started vitamin D  50,000 units p.o. weekly, follow with PCP to repeat vitamin D  level after 3 to 6 months.   Vitamin B12  level 240, at lower end, goal >400,  started vitamin B12 supplement to prevent deficiency.  Repeat B12 level after 3 to 6 months.   Folic acid  level 6.8, at lower end, started folic acid  1 mg p.o. daily to prevent deficiency.  Repeat folic acid  level after 3 to 6 months.   Other Comorbidities Peripheral neuropathy Hypertension Hypothyroidism IBS Bilateral BKA's Suprapubic catheter due to neurogenic bladder Long-term care facility resident    Recent discharge from the hospital on 05/01/2024 after hospitalization for AKI, catheter associated UTI, suspected pneumonia, acute hypoxic respiratory failure, gastroparesis, ileus, chronic opioid  use   Patient will remain high risk for re-admissions, further morbidity and mortality due to his multiple severe chronic conditions as outlined above.         Consultants: Nephrology Pulmonology   Procedures: 8/12 left thoracentesis, 100 mL removed          Disposition: Long term care facility Diet recommendation:  Cardiac and Carb modified diet DISCHARGE MEDICATION: Allergies as of 05/18/2024   No Known Allergies      Medication List     TAKE these medications    acetaminophen  325 MG tablet Commonly known as: TYLENOL  Take 650 mg by mouth in the morning and at bedtime.   bisacodyl  10 MG suppository Commonly known as: DULCOLAX Place 10 mg rectally daily as needed for moderate constipation. What changed: Another medication with the same name was added. Make sure you understand how and when to take each.   bisacodyl  5 MG EC tablet Commonly known as: DULCOLAX Take 2 tablets (10 mg total) by mouth at bedtime. What changed: You were already taking a medication with the same name, and this prescription was added. Make sure you understand how and when to take each.   cyanocobalamin  1000 MCG tablet Take 1 tablet (1,000 mcg total) by mouth daily.   Doxepin  HCl 6 MG Tabs Take 6 mg by mouth at bedtime.   famotidine  10 MG tablet Commonly known as: PEPCID  Take 1 tablet (10 mg total) by mouth daily.   feeding supplement Liqd Take 237 mLs by mouth 2 (two) times daily between meals.   folic acid  1 MG tablet Commonly known as: FOLVITE  Take 1 tablet (1 mg total) by mouth daily.   furosemide  40 MG tablet Commonly known as: LASIX  Take 1 tablet (40 mg total) by mouth daily.   gabapentin  300 MG capsule Commonly known as: NEURONTIN  Take 300 mg by mouth at bedtime.   hydrocortisone  cream 1 % Apply 1 Application topically daily.   insulin  glargine-yfgn 100 UNIT/ML injection Commonly known as: SEMGLEE  Inject 0.1 mLs (10 Units total) into the skin 2 (two) times  daily.   insulin  lispro 100 UNIT/ML injection Commonly known as: HUMALOG  Inject 0-15 Units into the skin 4 (four) times daily -  before meals and at bedtime. <150= 0 units 151-200= 3 units 201-250= 6 units 251-300= 9 units 301-350= 12 units 351-400= 15 units >400 call MD   lactulose  20 g packet Commonly known as: CEPHULAC  Take 20 g by mouth 2 (two) times daily.   levothyroxine  175 MCG tablet Commonly known as: SYNTHROID  Take 175 mcg by mouth at bedtime.   linaclotide  145 MCG Caps capsule Commonly known as: LINZESS  Take 1 capsule (145 mcg total) by mouth daily before breakfast.   linezolid  600 MG tablet Commonly known as: ZYVOX  Take 1 tablet (600 mg total) by mouth every 12 (twelve) hours for 3 days.   melatonin 5 MG Tabs Take 10 mg  by mouth at bedtime.   metoCLOPramide  10 MG tablet Commonly known as: REGLAN  Take 0.5 tablets (5 mg total) by mouth 3 (three) times daily before meals.   metoprolol  succinate 25 MG 24 hr tablet Commonly known as: TOPROL -XL Take 25 mg by mouth daily.   ondansetron  4 MG tablet Commonly known as: ZOFRAN  Take 4 mg by mouth daily.   Oxycodone  HCl 10 MG Tabs Take 0.5-1 tablets (5-10 mg total) by mouth every 6 (six) hours as needed for moderate pain (pain score 4-6) or severe pain (pain score 7-10). What changed:  medication strength how much to take when to take this reasons to take this additional instructions   patiromer  8.4 g packet Commonly known as: VELTASSA  Take 2 packets (16.8 g total) by mouth daily.   polyethylene glycol 17 g packet Commonly known as: MIRALAX  / GLYCOLAX  Take 17 g by mouth 2 (two) times daily.   senna-docusate 8.6-50 MG tablet Commonly known as: Senokot-S Take 1 tablet by mouth 2 (two) times daily.   simethicone  80 MG chewable tablet Commonly known as: MYLICON Chew 1 tablet (80 mg total) by mouth 4 (four) times daily.   sitaGLIPtin 100 MG tablet Commonly known as: JANUVIA Take 100 mg by mouth daily.    sodium phosphate  Pediatric 3.5-9.5 GM/59ML enema Place 1 enema rectally every other day as needed for severe constipation.   Vitamin D  (Ergocalciferol ) 1.25 MG (50000 UNIT) Caps capsule Commonly known as: DRISDOL  Take 1 capsule (50,000 Units total) by mouth every 7 (seven) days.        Contact information for after-discharge care     Destination     [facility] .   Service: Skilled Nursing Contact information: [address] [place]  [phone]                    Discharge Exam: Fredricka Weights   05/16/24 0500 05/17/24 0500 05/18/24 0500  Weight: 126.4 kg 128 kg 130.8 kg   General exam: awake, alert, no acute distress, obese HEENT: atraumatic, clear conjunctiva, anicteric sclera, moist mucus membranes, hearing grossly normal  Respiratory system: CTAB diminished bases due to body habitus, no expiratory wheezes or rhonchi, normal respiratory effort at rest on 3 L/min [place] O2. Cardiovascular system: normal S1/S2, RRR  Gastrointestinal system: soft, NT, ND Central nervous system: A&O x3. no gross focal neurologic deficits, normal speech Extremities: b/l BKA's Skin: dry, intact, normal temperature Psychiatry: irritable mood, congruent affect  Condition at discharge: stable  The results of significant diagnostics from this hospitalization (including imaging, microbiology, ancillary and laboratory) are listed below for reference.   Imaging Studies: DG Chest Port 1 View Result Date: 05/12/2024 CLINICAL DATA:  Respiratory failure with hypoxia. Shortness of breath. Facial swelling. EXAM: PORTABLE CHEST 1 VIEW COMPARISON:  05/11/2024 FINDINGS: Stable mildly enlarged cardiac silhouette. Stable small to moderate-sized left pleural effusion, small right pleural effusion and bilateral lower lung zone linear atelectasis/scarring. Stable thoracic spine fixation hardware and post corpectomy hardware. IMPRESSION: 1. Stable small to  moderate-sized left pleural effusion, small right pleural effusion and bilateral lower lung zone linear atelectasis/scarring. 2. Stable mild cardiomegaly. Electronically Signed   By: Elspeth Bathe M.D.   On: 05/12/2024 13:36   DG Abd 1 View Result Date: 05/12/2024 CLINICAL DATA:  [id number] Constipation [id number]. EXAM: ABDOMEN - 1 VIEW COMPARISON:  None Available. FINDINGS: The bowel gas pattern is non-obstructive. There is mild-to-moderate stool burden mainly overlying the rectum/sigmoid colon region. No evidence of pneumoperitoneum,  within the limitations of a supine film. No acute osseous abnormalities. The soft tissues are within normal limits. Surgical changes, devices, tubes and lines: Thoracic spinal fixation hardware noted. IMPRESSION: Nonobstructive bowel gas pattern. Mild-to-moderate stool burden mainly overlying the rectum/sigmoid colon region. Electronically Signed   By: Ree Molt M.D.   On: 05/12/2024 10:54   CT CHEST ABDOMEN PELVIS WO CONTRAST Result Date: 05/11/2024 CLINICAL DATA:  Sepsis Shortness of Breath; Facial Swelling Mental status change, unknown cause Best possible images due to pt condition EXAM: CT CHEST, ABDOMEN AND PELVIS WITHOUT CONTRAST TECHNIQUE: Multidetector CT imaging of the chest, abdomen and pelvis was performed following the standard protocol without IV contrast. RADIATION DOSE REDUCTION: This exam was performed according to the departmental dose-optimization program which includes automated exposure control, adjustment of the mA and/or kV according to patient size and/or use of iterative reconstruction technique. COMPARISON:  CT abdomen pelvis 05/05/2024, CT chest 05/05/2024 FINDINGS: CT CHEST FINDINGS Cardiovascular: Normal heart size. No significant pericardial effusion. The thoracic aorta is normal in caliber. Mild atherosclerotic plaque of the thoracic aorta. Left anterior descending coronary artery calcifications. Mediastinum/Nodes: No gross hilar adenopathy, noting  limited sensitivity for the detection of hilar adenopathy on this noncontrast study. no enlarged mediastinal or axillary lymph nodes. Thyroid gland and esophagus demonstrate no significant findings. Interval development of debris/mucous within the right mainstem bronchi measuring up to 1.3 cm. Lungs/Pleura: Limited evaluation due to noncontrast study and motion artifact. Bilateral lower lobes consolidations. No pulmonary nodule. No pulmonary mass. Redemonstration of bilateral small, left greater than right, pleural effusions with pleural thickening. Pleural effusion. No pneumothorax. Musculoskeletal: Bilateral gynecomastia. No suspicious lytic or blastic osseous lesions. No acute displaced fracture. Degenerative changes of the cervical spine at the C5-C6 level. CT ABDOMEN PELVIS FINDINGS Hepatobiliary: No focal liver abnormality. No gallstones, gallbladder wall thickening, or pericholecystic fluid. No biliary dilatation. Pancreas: Diffusely atrophic. No focal lesion. Otherwise normal pancreatic contour. No surrounding inflammatory changes. No main pancreatic ductal dilatation. Spleen: Normal in size without focal abnormality. Adrenals/Urinary Tract: No adrenal nodule bilaterally. No nephrolithiasis and no hydronephrosis. No definite contour-deforming renal mass. No ureterolithiasis or hydroureter. The urinary bladder is decompressed with suprapubic catheter terminating within the lumen. Stomach/Bowel: Stomach is within normal limits. No evidence of bowel wall thickening or dilatation. Stool within the rectosigmoid colon. Appendix appears normal. Vascular/Lymphatic: No abdominal aorta or iliac aneurysm. Moderate atherosclerotic plaque of the aorta and its branches. No abdominal, pelvic, or inguinal lymphadenopathy. Reproductive: Prostate is unremarkable. Other: No intraperitoneal free fluid. No intraperitoneal free gas. No organized fluid collection. Musculoskeletal: At least small fat containing umbilical hernia  with an abdominal defect of 2.7 cm. Subcutaneus soft tissue edema. No suspicious lytic or blastic osseous lesions. No acute displaced fracture. Stable chronic T12 and L1 compression fractures. Thoracolumbar posterolateral surgical fusion surgical hardware. T8 and T9 corpectomy. IMPRESSION: 1. Interval development of debris within the right mainstem bronchi measuring up to 1.3 cm. 2. Bilateral lower lobe consolidations with limited evaluation on this noncontrast study. Finding may represent infection/inflammation versus atelectasis. 3. Redemonstration of bilateral small, left greater than right, pleural effusions with pleural thickening. 4. No acute intra-abdominal or intrapelvic abnormality. 5. Limited evaluation due to noncontrast study. 6.  Aortic Atherosclerosis (ICD10-I70.0). Electronically Signed   By: Morgane  Naveau M.D.   On: 05/11/2024 21:21   CT HEAD WO CONTRAST ( ) Result Date: 05/11/2024 CLINICAL DATA:  Mental status change, unknown cause EXAM: CT HEAD WITHOUT CONTRAST TECHNIQUE: Contiguous axial images were obtained from the base  of the skull through the vertex without intravenous contrast. RADIATION DOSE REDUCTION: This exam was performed according to the departmental dose-optimization program which includes automated exposure control, adjustment of the mA and/or kV according to patient size and/or use of iterative reconstruction technique. COMPARISON:  None Available. FINDINGS: Brain: No evidence of acute infarction, hemorrhage, hydrocephalus, extra-axial collection or mass lesion/mass effect. Vascular: No hyperdense vessel. Skull: No acute fracture. Sinuses/Orbits: Clear sinuses.  No acute orbital findings. Other: No mastoid effusions. IMPRESSION: No evidence of acute intracranial abnormality. Electronically Signed   By: Gilmore GORMAN Molt M.D.   On: 05/11/2024 21:17   DG Chest Port 1 View Result Date: 05/11/2024 CLINICAL DATA:  Pleural effusion EXAM: PORTABLE CHEST 1 VIEW COMPARISON:   05/08/2019 FINDINGS: Cardiac shadow is stable. Postsurgical change in the thoracic spine is noted. Left-sided pleural effusion is noted. No sizable right effusion is noted. Mild right basilar atelectasis is seen. IMPRESSION: Left-sided pleural effusion. Right basilar atelectasis. Electronically Signed   By: Oneil Devonshire M.D.   On: 05/11/2024 10:38   DG Chest Port 1 View Result Date: 05/07/2024 CLINICAL DATA:  Atelectasis EXAM: PORTABLE CHEST 1 VIEW COMPARISON:  Chest x-ray 05/05/2024 FINDINGS: Small pleural effusions and bibasilar opacities have not significantly changed. The heart is enlarged, unchanged. There is no pneumothorax or acute fracture. Thoracolumbar fusion hardware is again seen. IMPRESSION: Stable small pleural effusions and bibasilar atelectasis/airspace disease. Electronically Signed   By: Greig Pique M.D.   On: 05/07/2024 20:33   US  THORACENTESIS ASP PLEURAL SPACE W/IMG GUIDE Result Date: 05/07/2024 INDICATION: 63 year old male with a history of CKD who presented to the ED with facial swelling and progressive dyspnea with wheezing and labored breathing. Imaging with small left pleural effusion. Request for diagnostic and therapeutic thoracentesis. EXAM: ULTRASOUND GUIDED left THORACENTESIS MEDICATIONS: 1% lidocaine , 8 mL. COMPLICATIONS: None immediate. PROCEDURE: An ultrasound guided thoracentesis was thoroughly discussed with the patient and questions answered. The benefits, risks, alternatives and complications were also discussed. The patient understands and wishes to proceed with the procedure. Written consent was obtained. Ultrasound was performed to localize and mark an adequate pocket of fluid in the left chest. The area was then prepped and draped in the normal sterile fashion. 1% Lidocaine  was used for local anesthesia. Under ultrasound guidance a 6 Fr Safe-T-Centesis catheter was introduced. Thoracentesis was performed. The catheter was removed and a dressing applied. FINDINGS: A  total of approximately 100 mL of serous fluid was removed. Samples were sent to the laboratory as requested by the clinical team. IMPRESSION: Successful ultrasound guided right thoracentesis yielding 100 mL of pleural fluid. Procedure performed by: Sherrilee Bal, PA-C under the supervision of Dr. KANDICE Moan Electronically Signed   By: Marcey Moan M.D.   On: 05/07/2024 07:47   CT ABDOMEN PELVIS WO CONTRAST Result Date: 05/05/2024 CLINICAL DATA:  Bowel obstruction suspected give oral contrast please Ty EXAM: CT ABDOMEN AND PELVIS WITHOUT CONTRAST TECHNIQUE: Multidetector CT imaging of the abdomen and pelvis was performed following the standard protocol without IV contrast. RADIATION DOSE REDUCTION: This exam was performed according to the departmental dose-optimization program which includes automated exposure control, adjustment of the mA and/or kV according to patient size and/or use of iterative reconstruction technique. COMPARISON:  CT chest 05/05/2024 FINDINGS: Lower chest: Bilateral trace to small pleural effusions with associated pleural thickening. Bilateral lower lobe consolidation. Hepatobiliary: No focal liver abnormality. No gallstones, gallbladder wall thickening, or pericholecystic fluid. No biliary dilatation. Pancreas: Diffusely atrophic. Vaguely hazy contour of the proximal pancreas  with associated mild fat stranding. No main pancreatic ductal dilatation. Spleen: Normal in size without focal abnormality. Adrenals/Urinary Tract: No adrenal nodule bilaterally. No nephrolithiasis and no hydronephrosis. No definite contour-deforming renal mass. No ureterolithiasis or hydroureter. Suprapubic catheter with tip and inflated balloon terminating within the inflated urinary bladder lumen. The urinary bladder is grossly unremarkable. Stomach/Bowel: Stomach is within normal limits. No evidence of bowel wall thickening or dilatation. Appendix appears normal. Vascular/Lymphatic: No abdominal aorta or  iliac aneurysm. Moderate atherosclerotic plaque of the aorta and its branches. No abdominal, pelvic, or inguinal lymphadenopathy. Reproductive: Prostate is unremarkable. Other: No intraperitoneal free fluid. No intraperitoneal free gas. No organized fluid collection. Musculoskeletal: Small fat containing umbilical hernia. Diffuse subcutaneus soft tissue edema. Bilateral gynecomastia. No suspicious lytic or blastic osseous lesions. No acute displaced fracture. Stable chronic T12 and L1 compression fractures. Thoracic spine partially visualized posterolateral pleural surgical fusion and rods. T8 and T9 corpectomy. IMPRESSION: 1. Diffusely atrophic pancreas with question proximal acute pancreatitis. Correlate with lipase levels. 2. Diffuse subcutaneus soft tissue edema. 3. Small fat containing umbilical hernia. 4.  Aortic Atherosclerosis (ICD10-I70.0). 5. Bilateral trace to small pleural effusions with associated pleural thickening. Bilateral lower lobe consolidation-finding may represent infection/inflammation versus atelectasis. Limited evaluation on this noncontrast study. Please see separately dictated CT chest 05/05/2024. Electronically Signed   By: Morgane  Naveau M.D.   On: 05/05/2024 19:13   DG Chest Port 1 View Result Date: 05/05/2024 CLINICAL DATA:  Left-sided thoracentesis EXAM: PORTABLE CHEST 1 VIEW COMPARISON:  05/04/2024, CT chest 05/05/2024, 05/02/2024 FINDINGS: Posterior spinal rods. Stable enlarged cardiomediastinal silhouette. Small pleural effusions slightly diminished on the left. Persistent bibasilar opacities. No definitive pneumothorax. IMPRESSION: 1. Small pleural effusions slightly diminished on the left. No definitive pneumothorax. 2. Otherwise no significant change compared to yesterday's radiograph Electronically Signed   By: Luke Bun M.D.   On: 05/05/2024 16:52   CT CHEST WO CONTRAST Result Date: 05/05/2024 CLINICAL DATA:  Acute onset dyspnea with associated facial swelling and  associated wheezing. Multiple medical problems. EXAM: CT CHEST WITHOUT CONTRAST TECHNIQUE: Multidetector CT imaging of the chest was performed following the standard protocol without IV contrast. RADIATION DOSE REDUCTION: This exam was performed according to the departmental dose-optimization program which includes automated exposure control, adjustment of the mA and/or kV according to patient size and/or use of iterative reconstruction technique. COMPARISON:  Chest radiographs, most recent 05/04/2024. Chest CT, 04/21/2024 FINDINGS: Cardiovascular: Heart is normal in size configuration. Three-vessel coronary artery calcifications. No pericardial effusion. Mildly enlarged main pulmonary artery, 3.5 cm. Aorta normal in caliber. Mild aortic atherosclerotic calcifications. Mediastinum/Nodes: No neck base, mediastinal or hilar masses. Shotty neck base mediastinal lymph nodes, all subcentimeter with the exception of a subcarinal node measuring 1.6 cm in short axis. These are stable from the prior CT. Trachea esophagus are unremarkable. Lungs/Pleura: Small left and trace right loculated appearing posterior pleural effusions. There is associated rounded lower lobe left upper lobe lingula lung opacity consistent with atelectasis. Additional lung opacity, also consistent with atelectasis, projects along oblique fissures, greater on the left. There is bilateral interstitial thickening. These findings are stable from the prior CT. No pneumothorax. Upper Abdomen: No acute findings. Musculoskeletal: Previous fractures of T8-T9. Interbody fusion device extends from the lower endplate of T7 to the upper endplate of T10, well positioned and stable. There are well positioned and stable pedicle screws interconnecting rods spanning from T5 through T12. Mild old compression deformity of L1. No osteoblastic or osteolytic lesions. There is diffuse subcutaneous soft  tissue edema along the chest and visualized upper abdomen. IMPRESSION: 1.  No significant change from the recent prior chest CT. 2. Persistent small left and trace right loculated pleural effusions. Persistent rounded lung opacity in the lower lobes suspected be atelectasis, also noted the left upper lobe lingula and abutting the oblique fissures. 3. Persistent bilateral interstitial thickening suspected to be interstitial pulmonary edema. There is associated diffuse subcutaneous soft tissue edema. Aortic Atherosclerosis (ICD10-I70.0). Electronically Signed   By: Alm Parkins M.D.   On: 05/05/2024 09:41   DG Chest Port 1 View Result Date: 05/04/2024 CLINICAL DATA:  Shortness of breath EXAM: PORTABLE CHEST 1 VIEW COMPARISON:  Radiograph 05/02/2024, CT 04/21/2024 FINDINGS: Stable heart size and mediastinal contours. Bibasilar opacities and pleural effusions, questionable improvement on the left. No new consolidation. No pneumothorax. Thoracic spinal fusion hardware. IMPRESSION: Bibasilar opacities and pleural effusions, questionable improvement on the left. Electronically Signed   By: Andrea Gasman M.D.   On: 05/04/2024 23:11   DG Chest Portable 1 View Result Date: 05/02/2024 CLINICAL DATA:  acute on chronic hypoxia EXAM: PORTABLE CHEST - 1 VIEW COMPARISON:  April 19, 2024 FINDINGS: Similar small bilateral pleural effusions with adjacent pleural thickening. Patchy airspace consolidation in the mid and lower lung zones bilaterally, unchanged, possibly atelectasis. No pneumothorax. Moderate cardiomegaly. Tortuous aorta with aortic atherosclerosis. No acute fracture or destructive lesions. Multilevel thoracic osteophytosis. Similar thoracic fusion hardware. IMPRESSION: Overall, unchanged appearance of the bilateral pleural effusions and mid and lower lung airspace disease, likely atelectasis. Electronically Signed   By: Rogelia Myers M.D.   On: 05/02/2024 20:25   DG Abd Portable 1V Result Date: 04/23/2024 CLINICAL DATA:  NG tube placement EXAM: PORTABLE ABDOMEN - 1 VIEW COMPARISON:   None Available. FINDINGS: NG tube with tip in the stomach. Side port is above the GE junction. The stomach is gas distended. IMPRESSION: 1. NG tube with tip in the stomach. Side port above the GE junction. Recommend advancement by 4 cm. 2. Gas in the stomach. Electronically Signed   By: Jackquline Boxer M.D.   On: 04/23/2024 10:14   DG Abd 2 Views Result Date: 04/22/2024 CLINICAL DATA:  86105 Emesis 86105 [id number] Constipation [id number]. EXAM: ABDOMEN - 2 VIEW COMPARISON:  12/11/2019. FINDINGS: There is marked gaseous distention of the stomach. Correlate for gastric outlet obstruction. There is also increased amount of gas throughout the small bowel and colon, likely manifestation of adynamic ileus. Consider radiographic follow up if clinically indicated. No evidence of pneumoperitoneum, within the limitations of a supine film. No acute osseous abnormalities. There is small to moderate left pleural effusion with associated compressive atelectatic changes in the left lung. There is also small right pleural effusion. The soft tissues are otherwise within normal limits. Surgical changes, devices, tubes and lines: Thoracic spinal fixation hardware noted. IMPRESSION: 1. Marked gaseous distention of the stomach. Correlate clinically for gastric outlet obstruction. 2. Increased amount of gas throughout the small bowel and colon, likely manifestation of adynamic ileus. Consider radiographic follow up, if clinically indicated. 3. Small to moderate left pleural effusion with associated compressive atelectatic changes in the left lung. Small right pleural effusion. Electronically Signed   By: Ree Molt M.D.   On: 04/22/2024 08:21   CT CHEST WO CONTRAST Result Date: 04/21/2024 CLINICAL DATA:  Respiratory illness. EXAM: CT CHEST WITHOUT CONTRAST TECHNIQUE: Multidetector CT imaging of the chest was performed following the standard protocol without IV contrast. RADIATION DOSE REDUCTION: This exam was performed according to  the departmental  dose-optimization program which includes automated exposure control, adjustment of the mA and/or kV according to patient size and/or use of iterative reconstruction technique. COMPARISON:  Chest CT dated 04/14/2024. FINDINGS: Evaluation of this exam is limited in the absence of intravenous contrast and streak artifact caused by spinal hardware. Cardiovascular: There is no cardiomegaly or pericardial effusion. There is coronary vascular calcification. The thoracic aorta and central pulmonary arteries are grossly unremarkable. Mediastinum/Nodes: No obvious hilar or mediastinal adenopathy. Evaluation however is limited in the absence of intravenous contrast as well as consolidative changes the lungs. The esophagus is grossly unremarkable. No mediastinal fluid collection Lungs/Pleura: Similar appearance of small bilateral pleural effusions with pleural thickening. Bilateral lower lobe and lingula consolidation similar or slightly progressed since the prior CT. Findings may represent atelectasis but pneumonia is not excluded. Faint ground-glass density in the right lung may represent atelectasis or pneumonia. No pneumothorax. Linear secretions noted in the trachea. The central airways are patent. Upper Abdomen: No acute abnormality. Musculoskeletal: Osteopenia with degenerative changes of the spine. Posterior fusion hardware. No acute osseous pathology. IMPRESSION: 1. Similar appearance of small bilateral pleural effusions with pleural thickening. 2. Bilateral lower lobe and lingula consolidation similar or slightly progressed since the prior CT. Findings may represent atelectasis but pneumonia is not excluded. Electronically Signed   By: Vanetta Chou M.D.   On: 04/21/2024 16:48   US  RENAL Result Date: 04/20/2024 CLINICAL DATA:  Acute kidney injury EXAM: RENAL / URINARY TRACT ULTRASOUND COMPLETE COMPARISON:  04/14/2024 CT scan FINDINGS: Right Kidney: Renal measurements: 11.7 by 6.5 by 5.8 cm =  volume: 228 mL. Echogenicity within normal limits. No mass or hydronephrosis visualized. Left Kidney: Renal measurements: 13.0 by 6.5 by 5.5 cm = volume: 240 mL. Echogenicity within normal limits. No mass or hydronephrosis visualized. Bladder: Empty, suprapubic catheter in place. Other: None. IMPRESSION: 1. Normal sonographic appearance of the kidneys. 2. Empty bladder, suprapubic catheter in place. Electronically Signed   By: Ryan Salvage M.D.   On: 04/20/2024 18:30   NM Pulmonary Perfusion Result Date: 04/20/2024 CLINICAL DATA:  Concern for pulmonary embolism. Elevated D-dimer. Bilateral effusions EXAM: NUCLEAR MEDICINE PERFUSION LUNG SCAN TECHNIQUE: Perfusion images were obtained in multiple projections after intravenous injection of radiopharmaceutical. RADIOPHARMACEUTICALS:  4.1 mCi Tc-39m MAA COMPARISON:  Radiograph 03/20/2024 FINDINGS: No wedge-shaped peripheral perfusion defects to suggest acute pulmonary edema. Increased attenuation LEFT lower lobe related to large effusion. IMPRESSION: No evidence acute pulmonary disease. LEFT pleural effusion. Electronically Signed   By: Jackquline Boxer M.D.   On: 04/20/2024 16:17   ECHOCARDIOGRAM COMPLETE Result Date: 04/19/2024    ECHOCARDIOGRAM REPORT   Patient Name:   STANLEY LYNESS Date of Exam: 04/19/2024 Medical Rec #:  [id number]      Height:       60.0 in Accession #:    [id number]     Weight:       257.9 lb Date of Birth:  1960/11/18      BSA:          2.080 m Patient Age:    63 years       BP:           153/94 mmHg Patient Gender: M              HR:           84 bpm. Exam Location:  [facility] Procedure: 2D Echo, Cardiac Doppler, Color Doppler and Intracardiac            Opacification Agent (  Both Spectral and Color Flow Doppler were            utilized during procedure). Indications:     CHF I50.9  History:         Patient has prior history of Echocardiogram examinations, most                  recent 12/10/2019. Risk Factors:Hypertension, Diabetes,                   Dyslipidemia and Current Smoker.  Sonographer:     Thea Norlander RCS Referring Phys:  [id number] DEREK LELON CANALES Diagnosing Phys: Dwayne D Callwood MD IMPRESSIONS  1. Left ventricular ejection fraction, by estimation, is 55 to 60%. The left ventricle has normal function. The left ventricle has no regional wall motion abnormalities. Left ventricular diastolic function could not be evaluated.  2. Right ventricular systolic function is normal. The right ventricular size is not well visualized.  3. The mitral valve is normal in structure. No evidence of mitral valve regurgitation.  4. The aortic valve is normal in structure. Aortic valve regurgitation is not visualized. Conclusion(s)/Recommendation(s): Poor windows for evaluation of left ventricular function by transthoracic echocardiography. Would recommend an alternative means of evaluation. FINDINGS  Left Ventricle: Left ventricular ejection fraction, by estimation, is 55 to 60%. The left ventricle has normal function. The left ventricle has no regional wall motion abnormalities. Definity  contrast agent was given IV to delineate the left ventricular  endocardial borders. Strain was performed and the global longitudinal strain is indeterminate. The left ventricular internal cavity size was normal in size. There is no left ventricular hypertrophy. Left ventricular diastolic function could not be evaluated. Right Ventricle: The right ventricular size is not well visualized. Right vetricular wall thickness was not assessed. Right ventricular systolic function is normal. Left Atrium: Left atrial size was not assessed. Right Atrium: Right atrial size was not assessed. Pericardium: There is no evidence of pericardial effusion. Mitral Valve: The mitral valve is normal in structure. No evidence of mitral valve regurgitation. Tricuspid Valve: The tricuspid valve is normal in structure. Tricuspid valve regurgitation is trivial. Aortic Valve: The aortic valve is normal  in structure. Aortic valve regurgitation is not visualized. Aortic valve peak gradient measures 5.4 mmHg. Pulmonic Valve: The pulmonic valve was not well visualized. Pulmonic valve regurgitation is not visualized. Aorta: Aortic root could not be assessed. IAS/Shunts: The interatrial septum was not assessed. Additional Comments: 3D was performed not requiring image post processing on an independent workstation and was indeterminate.  LEFT VENTRICLE PLAX 2D LVOT diam:     2.40 cm   Diastology LV SV:         89        LV e' medial:    8.16 cm/s LV SV Index:   43        LV E/e' medial:  10.6 LVOT Area:     4.52 cm  LV e' lateral:   9.90 cm/s                          LV E/e' lateral: 8.7  LEFT ATRIUM             Index LA Vol (A2C):   41.9 ml 20.15 ml/m LA Vol (A4C):   56.3 ml 27.07 ml/m LA Biplane Vol: 48.7 ml 23.42 ml/m  AORTIC VALVE AV Area (Vmax): 3.80 cm AV Vmax:        116.00 cm/s  AV Peak Grad:   5.4 mmHg LVOT Vmax:      97.50 cm/s LVOT Vmean:     62.900 cm/s LVOT VTI:       0.196 m  AORTA Ao Root diam: 4.10 cm Ao Asc diam:  3.70 cm MITRAL VALVE MV Area (PHT): 4.68 cm    SHUNTS MV Decel Time: 162 msec    Systemic VTI:  0.20 m MV E velocity: 86.50 cm/s  Systemic Diam: 2.40 cm MV A velocity: 80.70 cm/s MV E/A ratio:  1.07 Dwayne JONETTA Lovelace MD Electronically signed by Cara JONETTA Lovelace MD Signature Date/Time: 04/19/2024/1:37:58 PM    Final    DG Chest Port 1 View Result Date: 04/19/2024 CLINICAL DATA:  [id number].  Hypoxia. EXAM: PORTABLE CHEST 1 VIEW COMPARISON:  CT 04/14/2024 FINDINGS: Small pleural effusions are again noted with overlying opacities on the left-greater-than-right. On CT this appeared largely due to rounded atelectasis. The radiographic appearance is nonspecific. The opacities merge with coarse linear atelectatic markings converging on the hila. The upper lung fields are clear. There is mild cardiomegaly without CHF. The mediastinum is normally outlined. There is thoracic spine fusion hardware and  a metallic cylinder graft in the lower thoracic spine. No new abnormality. IMPRESSION: 1. Small pleural effusions with overlying opacities on the left-greater-than-right. On CT this appeared largely due to rounded atelectasis. The radiographic appearance is nonspecific, but the overall aeration seems unchanged. 2. Mild cardiomegaly without CHF. Electronically Signed   By: Francis Quam M.D.   On: 04/19/2024 06:52    Microbiology: Results for orders placed or performed during the hospital encounter of 05/02/24  Resp panel by RT-PCR (RSV, Flu A&B, Covid) Anterior Nasal Swab     Status: None   Collection Time: 05/02/24  8:06 PM   Specimen: Anterior Nasal Swab  Result Value Ref Range Status   SARS Coronavirus 2 by RT PCR NEGATIVE NEGATIVE Final    Comment: (NOTE) SARS-CoV-2 target nucleic acids are NOT DETECTED.  The SARS-CoV-2 RNA is generally detectable in upper respiratory specimens during the acute phase of infection. The lowest concentration of SARS-CoV-2 viral copies this assay can detect is 138 copies/mL. A negative result does not preclude SARS-Cov-2 infection and should not be used as the sole basis for treatment or other patient management decisions. A negative result may occur with  improper specimen collection/handling, submission of specimen other than nasopharyngeal swab, presence of viral mutation(s) within the areas targeted by this assay, and inadequate number of viral copies(<138 copies/mL). A negative result must be combined with clinical observations, patient history, and epidemiological information. The expected result is Negative.  Fact Sheet for Patients:  BloggerCourse.com  Fact Sheet for Healthcare Providers:  SeriousBroker.it  This test is no t yet approved or cleared by the [place]  FDA and  has been authorized for detection and/or diagnosis of SARS-CoV-2 by FDA under an Emergency Use Authorization (EUA).  This EUA will remain  in effect (meaning this test can be used) for the duration of the COVID-19 declaration under Section 564(b)(1) of the Act, 21 U.S.C.section 360bbb-3(b)(1), unless the authorization is terminated  or revoked sooner.       Influenza A by PCR NEGATIVE NEGATIVE Final   Influenza B by PCR NEGATIVE NEGATIVE Final    Comment: (NOTE) The Xpert Xpress SARS-CoV-2/FLU/RSV plus assay is intended as an aid in the diagnosis of influenza from Nasopharyngeal swab specimens and should not be used as a sole basis for treatment. Nasal washings and aspirates are unacceptable  for Xpert Xpress SARS-CoV-2/FLU/RSV testing.  Fact Sheet for Patients: BloggerCourse.com  Fact Sheet for Healthcare Providers: SeriousBroker.it  This test is not yet approved or cleared by the [place]  FDA and has been authorized for detection and/or diagnosis of SARS-CoV-2 by FDA under an Emergency Use Authorization (EUA). This EUA will remain in effect (meaning this test can be used) for the duration of the COVID-19 declaration under Section 564(b)(1) of the Act, 21 U.S.C. section 360bbb-3(b)(1), unless the authorization is terminated or revoked.     Resp Syncytial Virus by PCR NEGATIVE NEGATIVE Final    Comment: (NOTE) Fact Sheet for Patients: BloggerCourse.com  Fact Sheet for Healthcare Providers: SeriousBroker.it  This test is not yet approved or cleared by the [place]  FDA and has been authorized for detection and/or diagnosis of SARS-CoV-2 by FDA under an Emergency Use Authorization (EUA). This EUA will remain in effect (meaning this test can be used) for the duration of the COVID-19 declaration under Section 564(b)(1) of the Act, 21 U.S.C. section 360bbb-3(b)(1), unless the authorization is terminated or revoked.  Performed at [facility], [address]  [place], [place] [address]   Body fluid culture w Gram Stain     Status: None   Collection Time: 05/05/24  4:10 PM   Specimen: PATH Cytology Pleural fluid  Result Value Ref Range Status   Specimen Description   Final    FLUID Performed at [facility], [address], [place], [place] [address]    Special Requests   Final    CYTO PLEU Performed at [facility], [address] [place], [place] [address]    Gram Stain NO WBC SEEN NO ORGANISMS SEEN CYTOSPIN SMEAR   Final   Culture   Final    NO GROWTH 3 DAYS Performed at [facility], [address] [place], [place] [address]    Report Status 05/09/2024 FINAL  Final  Urine Culture (for pregnant, neutropenic or urologic patients or patients with an indwelling urinary catheter)     Status: Abnormal   Collection Time: 05/11/24  5:10 PM   Specimen: Urine, Catheterized  Result Value Ref Range Status   Specimen Description   Final    URINE, CATHETERIZED Performed at [facility], [address] [place], [place] [address]    Special Requests   Final    NONE Performed at [facility], [address] [place], [place] [address]    Culture (A)  Final    >=100,000 COLONIES/mL STAPHYLOCOCCUS AUREUS METHICILLIN RESISTANT STAPHYLOCOCCUS AUREUS    Report Status 05/14/2024 FINAL  Final   Organism ID, Bacteria STAPHYLOCOCCUS AUREUS (A)  Final      Susceptibility   Staphylococcus aureus - MIC*    CIPROFLOXACIN  >=8 RESISTANT Resistant     GENTAMICIN <=0.5 SENSITIVE Sensitive     NITROFURANTOIN <=16 SENSITIVE Sensitive     OXACILLIN >=4 RESISTANT Resistant     TETRACYCLINE <=1 SENSITIVE Sensitive     VANCOMYCIN  1 SENSITIVE Sensitive     TRIMETH /SULFA  <=10 SENSITIVE Sensitive     RIFAMPIN  <=0.5 SENSITIVE Sensitive     Inducible Clindamycin NEGATIVE Sensitive     LINEZOLID  2 SENSITIVE Sensitive     * >=100,000 COLONIES/mL STAPHYLOCOCCUS AUREUS  MRSA Next Gen by PCR, Nasal     Status: Abnormal    Collection Time: 05/11/24  9:08 PM   Specimen: Nasal Mucosa; Nasal Swab  Result Value Ref Range Status   MRSA by PCR Next Gen DETECTED (  A) NOT DETECTED Final    Comment: RESULT CALLED TO, READ BACK BY AND VERIFIED WITH:  HOLLY CHANDLER AT 0007 05/12/24 JG (NOTE) The GeneXpert MRSA Assay (FDA approved for NASAL specimens only), is one component of a comprehensive MRSA colonization surveillance program. It is not intended to diagnose MRSA infection nor to guide or monitor treatment for MRSA infections. Test performance is not FDA approved in patients less than 7 years old. Performed at [facility], [address] [place], [place] [address]   Culture, blood (Routine X 2) w Reflex to ID Panel     Status: None   Collection Time: 05/11/24  9:32 PM   Specimen: BLOOD  Result Value Ref Range Status   Specimen Description BLOOD BLOOD RIGHT HAND  Final   Special Requests   Final    BOTTLES DRAWN AEROBIC AND ANAEROBIC Blood Culture results may not be optimal due to an inadequate volume of blood received in culture bottles   Culture   Final    NO GROWTH 5 DAYS Performed at [facility], [address] [place], [place] [address]    Report Status 05/16/2024 FINAL  Final  Culture, blood (Routine X 2) w Reflex to ID Panel     Status: None   Collection Time: 05/11/24  9:32 PM   Specimen: BLOOD  Result Value Ref Range Status   Specimen Description BLOOD BLOOD RIGHT ARM  Final   Special Requests   Final    BOTTLES DRAWN AEROBIC AND ANAEROBIC Blood Culture adequate volume   Culture   Final    NO GROWTH 5 DAYS Performed at [facility], [address] [place], [place] [address]    Report Status 05/16/2024 FINAL  Final    Labs: CBC: Recent Labs  Lab 05/11/24 2240 05/12/24 0558 05/13/24 0404 05/14/24 0501 05/15/24 0808  WBC 6.6 8.8 4.6 5.5 4.7  HGB 9.4* 8.4* 8.2* 8.5* 8.4*  HCT 30.1* 27.0* 26.3* 28.2* 26.9*  MCV 93.2 93.8 93.9 96.6 93.1  PLT 137*  125* 118* 124* 136*   Basic Metabolic Panel: Recent Labs  Lab 05/11/24 2132 05/12/24 0558 05/13/24 0404 05/14/24 0501 05/15/24 0808 05/16/24 1039  NA 135 139 136 138 141 139  K 5.5* 5.7* 5.4* 5.7* 4.9 4.7  CL 101 104 103 104 105 106  CO2 24 24 25 26 26 24   GLUCOSE 80 78 151* 93 195* 149*  BUN 82* 80* 83* 77* 68* 61*  CREATININE 4.37* 4.53* 4.71* 4.73* 4.67* 4.62*  CALCIUM  7.4* 7.0* 7.0* 7.2* 7.1* 7.2*  MG 1.7 1.7  --   --   --   --   PHOS  --  4.6  --  6.2* 5.1* 4.9*   Liver Function Tests: Recent Labs  Lab 05/14/24 0501 05/15/24 0808 05/16/24 1039  ALBUMIN  1.8* 1.7* 1.7*   CBG: Recent Labs  Lab 05/16/24 2009 05/17/24 0818 05/17/24 1222 05/17/24 1629 05/18/24 0746  GLUCAP 115* 136* 141* 121* 109*    Discharge time spent: greater than 30 minutes.  Signed: Burnard DELENA Cunning, DO [facility] 05/18/2024

## 2024-05-16 NOTE — Plan of Care (Signed)
  Problem: Education: Goal: Ability to describe self-care measures that may prevent or decrease complications (Diabetes Survival Skills Education) will improve Outcome: Progressing   Problem: Pain Managment: Goal: General experience of comfort will improve and/or be controlled Outcome: Progressing   Problem: Safety: Goal: Ability to remain free from injury will improve Outcome: Progressing   Problem: Skin Integrity: Goal: Risk for impaired skin integrity will decrease Outcome: Progressing

## 2024-05-17 DIAGNOSIS — N049 Nephrotic syndrome with unspecified morphologic changes: Secondary | ICD-10-CM | POA: Diagnosis not present

## 2024-05-17 LAB — GLUCOSE, CAPILLARY
Glucose-Capillary: 136 mg/dL — ABNORMAL HIGH (ref 70–99)
Glucose-Capillary: 141 mg/dL — ABNORMAL HIGH (ref 70–99)
Glucose-Capillary: 121 mg/dL — ABNORMAL HIGH (ref 70–99)

## 2024-05-17 MED ORDER — ORAL CARE MOUTH RINSE: 15.0000 mL | OROMUCOSAL | Status: DC | PRN

## 2024-05-17 MED ORDER — NICOTINE 14 MG/24HR TD PT24
14.0000 mg | MEDICATED_PATCH | Freq: Every day | TRANSDERMAL | Status: DC
  Administered 2024-05-17 – 2024-05-18 (×2): 14 mg via TRANSDERMAL
  Filled 2024-05-17 (×2): qty 1

## 2024-05-17 NOTE — Plan of Care (Signed)

## 2024-05-17 NOTE — Progress Notes (Signed)
 [facility]  ROUNDING NOTE   Subjective:   Eugene [name]  is a 63 y.o.  male  with past medical conditions including diabetes, bilateral BKA, suprapubic catheter, and hepatitis C. Patient presents to ED from SNF with facial edema and has been admitted for Wheezing [R06.2] Anaphylaxis [T78.2XXA] Facial swelling [R22.0] AKI (acute kidney injury) (HCC) [N17.9] Acute on chronic respiratory failure with hypoxia (HCC) [J96.21]  Patient is known to our practice from recent admission. He was released and pending follow up appt in our office at discharge.  Update:  No labs today. Patient eating breakfast this morning. Restart furosemide  40mg  daily.   08/23 0701 - 08/24 0700 In: 1440 [P.O.:1440] Out: 1400 [Urine:1400]  Lab Results  Component Value Date   CREATININE 4.62 (H) 05/16/2024   CREATININE 4.67 (H) 05/15/2024   CREATININE 4.73 (H) 05/14/2024     Objective:  Vital signs in last 24 hours:  Temp:  [97.8 F (36.6 C)-99.9 F (37.7 C)] 97.8 F (36.6 C) (08/24 0818) Pulse Rate:  [42-103] 86 (08/24 0818) Resp:  [14-18] 16 (08/24 0818) BP: (123-193)/(55-76) 123/67 (08/24 0818) SpO2:  [88 %-98 %] 88 % (08/24 0818) Weight:  [871 kg] 128 kg (08/24 0500)  Weight change: 1.6 kg Filed Weights   05/13/24 0600 05/16/24 0500 05/17/24 0500  Weight: 131.8 kg 126.4 kg 128 kg    Intake/Output: I/O last 3 completed shifts: In: 1440 [P.O.:1440] Out: 1850 [Urine:1850]   Intake/Output this shift:  Total I/O In: 240 [P.O.:240] Out: -   Physical Exam: General: NAD, laying in bed  Head: Normocephalic  Eyes: Anicteric  Lungs:  Diminished, 3L [place]  Heart: Regular rate   Abdomen:  Distended and firm, obese  Extremities: Trace peripheral edema.,  Bilateral BKA  Neurologic: Awake and alert  Skin: No rashes  Access: None  Suprapubic catheter in place  Basic Metabolic Panel: Recent Labs  Lab 05/11/24 0635 05/11/24 2132 05/12/24 0558 05/13/24 0404 05/14/24 0501  05/15/24 0808 05/16/24 1039  NA 136 135 139 136 138 141 139  K 5.8* 5.5* 5.7* 5.4* 5.7* 4.9 4.7  CL 102 101 104 103 104 105 106  CO2 24 24 24 25 26 26 24   GLUCOSE 145* 80 78 151* 93 195* 149*  BUN 77* 82* 80* 83* 77* 68* 61*  CREATININE 3.91* 4.37* 4.53* 4.71* 4.73* 4.67* 4.62*  CALCIUM  7.2* 7.4* 7.0* 7.0* 7.2* 7.1* 7.2*  MG 1.7 1.7 1.7  --   --   --   --   PHOS 3.9  --  4.6  --  6.2* 5.1* 4.9*    Liver Function Tests: Recent Labs  Lab 05/14/24 0501 05/15/24 0808 05/16/24 1039  ALBUMIN  1.8* 1.7* 1.7*   No results for input(s): LIPASE, AMYLASE in the last 168 hours.  No results for input(s): AMMONIA in the last 168 hours.  CBC: Recent Labs  Lab 05/11/24 2240 05/12/24 0558 05/13/24 0404 05/14/24 0501 05/15/24 0808  WBC 6.6 8.8 4.6 5.5 4.7  HGB 9.4* 8.4* 8.2* 8.5* 8.4*  HCT 30.1* 27.0* 26.3* 28.2* 26.9*  MCV 93.2 93.8 93.9 96.6 93.1  PLT 137* 125* 118* 124* 136*    Cardiac Enzymes: No results for input(s): CKTOTAL, CKMB, CKMBINDEX, TROPONINI in the last 168 hours.  BNP: Invalid input(s): POCBNP  CBG: Recent Labs  Lab 05/16/24 0824 05/16/24 1148 05/16/24 1652 05/16/24 2009 05/17/24 0818  GLUCAP 181* 150* 140* 115* 136*    Microbiology: Results for orders placed or performed during the hospital encounter of  05/02/24  Resp panel by RT-PCR (RSV, Flu A&B, Covid) Anterior Nasal Swab     Status: None   Collection Time: 05/02/24  8:06 PM   Specimen: Anterior Nasal Swab  Result Value Ref Range Status   SARS Coronavirus 2 by RT PCR NEGATIVE NEGATIVE Final    Comment: (NOTE) SARS-CoV-2 target nucleic acids are NOT DETECTED.  The SARS-CoV-2 RNA is generally detectable in upper respiratory specimens during the acute phase of infection. The lowest concentration of SARS-CoV-2 viral copies this assay can detect is 138 copies/mL. A negative result does not preclude SARS-Cov-2 infection and should not be used as the sole basis for treatment  or other patient management decisions. A negative result may occur with  improper specimen collection/handling, submission of specimen other than nasopharyngeal swab, presence of viral mutation(s) within the areas targeted by this assay, and inadequate number of viral copies(<138 copies/mL). A negative result must be combined with clinical observations, patient history, and epidemiological information. The expected result is Negative.  Fact Sheet for Patients:  BloggerCourse.com  Fact Sheet for Healthcare Providers:  SeriousBroker.it  This test is no t yet approved or cleared by the [place]  FDA and  has been authorized for detection and/or diagnosis of SARS-CoV-2 by FDA under an Emergency Use Authorization (EUA). This EUA will remain  in effect (meaning this test can be used) for the duration of the COVID-19 declaration under Section 564(b)(1) of the Act, 21 U.S.C.section 360bbb-3(b)(1), unless the authorization is terminated  or revoked sooner.       Influenza A by PCR NEGATIVE NEGATIVE Final   Influenza B by PCR NEGATIVE NEGATIVE Final    Comment: (NOTE) The Xpert Xpress SARS-CoV-2/FLU/RSV plus assay is intended as an aid in the diagnosis of influenza from Nasopharyngeal swab specimens and should not be used as a sole basis for treatment. Nasal washings and aspirates are unacceptable for Xpert Xpress SARS-CoV-2/FLU/RSV testing.  Fact Sheet for Patients: BloggerCourse.com  Fact Sheet for Healthcare Providers: SeriousBroker.it  This test is not yet approved or cleared by the [place]  FDA and has been authorized for detection and/or diagnosis of SARS-CoV-2 by FDA under an Emergency Use Authorization (EUA). This EUA will remain in effect (meaning this test can be used) for the duration of the COVID-19 declaration under Section 564(b)(1) of the Act, 21 U.S.C. section  360bbb-3(b)(1), unless the authorization is terminated or revoked.     Resp Syncytial Virus by PCR NEGATIVE NEGATIVE Final    Comment: (NOTE) Fact Sheet for Patients: BloggerCourse.com  Fact Sheet for Healthcare Providers: SeriousBroker.it  This test is not yet approved or cleared by the [place]  FDA and has been authorized for detection and/or diagnosis of SARS-CoV-2 by FDA under an Emergency Use Authorization (EUA). This EUA will remain in effect (meaning this test can be used) for the duration of the COVID-19 declaration under Section 564(b)(1) of the Act, 21 U.S.C. section 360bbb-3(b)(1), unless the authorization is terminated or revoked.  Performed at [facility], [address] [place], [place] [address]   Body fluid culture w Gram Stain     Status: None   Collection Time: 05/05/24  4:10 PM   Specimen: PATH Cytology Pleural fluid  Result Value Ref Range Status   Specimen Description   Final    FLUID Performed at [facility], [address] [place], [place] [address]    Special Requests   Final    CYTO PLEU Performed at [facility], [address]  [place], [place] [address]    Gram Stain NO WBC SEEN NO ORGANISMS SEEN CYTOSPIN SMEAR   Final   Culture   Final    NO GROWTH 3 DAYS Performed at [facility], [address] [place], [place] [address]    Report Status 05/09/2024 FINAL  Final  Urine Culture (for pregnant, neutropenic or urologic patients or patients with an indwelling urinary catheter)     Status: Abnormal   Collection Time: 05/11/24  5:10 PM   Specimen: Urine, Catheterized  Result Value Ref Range Status   Specimen Description   Final    URINE, CATHETERIZED Performed at [facility], [address] [place], [place] [address]    Special Requests   Final    NONE Performed at [facility], [address] [place], [place] [address]     Culture (A)  Final    >=100,000 COLONIES/mL STAPHYLOCOCCUS AUREUS METHICILLIN RESISTANT STAPHYLOCOCCUS AUREUS    Report Status 05/14/2024 FINAL  Final   Organism ID, Bacteria STAPHYLOCOCCUS AUREUS (A)  Final      Susceptibility   Staphylococcus aureus - MIC*    CIPROFLOXACIN  >=8 RESISTANT Resistant     GENTAMICIN <=0.5 SENSITIVE Sensitive     NITROFURANTOIN <=16 SENSITIVE Sensitive     OXACILLIN >=4 RESISTANT Resistant     TETRACYCLINE <=1 SENSITIVE Sensitive     VANCOMYCIN  1 SENSITIVE Sensitive     TRIMETH /SULFA  <=10 SENSITIVE Sensitive     RIFAMPIN  <=0.5 SENSITIVE Sensitive     Inducible Clindamycin NEGATIVE Sensitive     LINEZOLID  2 SENSITIVE Sensitive     * >=100,000 COLONIES/mL STAPHYLOCOCCUS AUREUS  MRSA Next Gen by PCR, Nasal     Status: Abnormal   Collection Time: 05/11/24  9:08 PM   Specimen: Nasal Mucosa; Nasal Swab  Result Value Ref Range Status   MRSA by PCR Next Gen DETECTED (A) NOT DETECTED Final    Comment: RESULT CALLED TO, READ BACK BY AND VERIFIED WITH:  HOLLY CHANDLER AT 0007 05/12/24 JG (NOTE) The GeneXpert MRSA Assay (FDA approved for NASAL specimens only), is one component of a comprehensive MRSA colonization surveillance program. It is not intended to diagnose MRSA infection nor to guide or monitor treatment for MRSA infections. Test performance is not FDA approved in patients less than 62 years old. Performed at [facility], [address] [place], [place] [address]   Culture, blood (Routine X 2) w Reflex to ID Panel     Status: None   Collection Time: 05/11/24  9:32 PM   Specimen: BLOOD  Result Value Ref Range Status   Specimen Description BLOOD BLOOD RIGHT HAND  Final   Special Requests   Final    BOTTLES DRAWN AEROBIC AND ANAEROBIC Blood Culture results may not be optimal due to an inadequate volume of blood received in culture bottles   Culture   Final    NO GROWTH 5 DAYS Performed at [facility], [address]  [place], [place] [address]    Report Status 05/16/2024 FINAL  Final  Culture, blood (Routine X 2) w Reflex to ID Panel     Status: None   Collection Time: 05/11/24  9:32 PM   Specimen: BLOOD  Result Value Ref Range Status   Specimen Description BLOOD BLOOD RIGHT ARM  Final   Special Requests   Final    BOTTLES DRAWN AEROBIC AND ANAEROBIC Blood Culture adequate volume   Culture   Final    NO GROWTH 5  DAYS Performed at [facility], [address] [place], [place] [address]    Report Status 05/16/2024 FINAL  Final    Coagulation Studies: No results for input(s): LABPROT, INR in the last 72 hours.  Urinalysis: No results for input(s): COLORURINE, LABSPEC, PHURINE, GLUCOSEU, HGBUR, BILIRUBINUR, KETONESUR, PROTEINUR, UROBILINOGEN, NITRITE, LEUKOCYTESUR in the last 72 hours.  Invalid input(s): APPERANCEUR     Imaging: No results found.    Medications:        arformoterol   15 mcg Nebulization BID   bisacodyl   10 mg Oral QHS   bisacodyl   10 mg Rectal Daily   Chlorhexidine  Gluconate Cloth  6 each Topical QHS   vitamin B-12  1,000 mcg Oral Daily   famotidine   10 mg Oral Daily   feeding supplement  237 mL Oral BID BM   folic acid   1 mg Oral Daily   furosemide   40 mg Oral Daily   gabapentin   300 mg Oral QHS   heparin  injection (subcutaneous)  5,000 Units Subcutaneous Q8H   insulin  aspart  0-15 Units Subcutaneous TID WC   insulin  aspart  4 Units Subcutaneous TID WC   insulin  glargine  14 Units Subcutaneous BID   ipratropium-albuterol   3 mL Nebulization BID   levothyroxine   175 mcg Oral Q0600   linaclotide   145 mcg Oral QAC breakfast   linagliptin   5 mg Oral Daily   linezolid   600 mg Oral Q12H   melatonin  10 mg Oral QHS   metoCLOPramide   5 mg Oral TID AC   midodrine   5 mg Oral TID WC   nicotine   14 mg Transdermal Daily   patiromer   16.8 g Oral Daily   polyethylene glycol  17 g Oral BID   senna-docusate  1 tablet Oral BID   Vitamin D   (Ergocalciferol )  50,000 Units Oral Q7 days   acetaminophen  **OR** acetaminophen , albuterol , hydrALAZINE  **OR** hydrALAZINE , metoprolol  tartrate, ondansetron  **OR** ondansetron  (ZOFRAN ) IV, mouth rinse, oxyCODONE , sodium chloride  flush, traZODone   Assessment/ Plan:  [name] is a 63 y.o.  male  with past medical conditions including diabetes, bilateral BKA, suprapubic catheter, hepatitis C, who was admitted to [facility] on [facility]/02[facility]2024 for Wheezing [R06.2] Anaphylaxis [T78.2XXA] Facial swelling [R22.0] AKI (acute kidney injury) (HCC) [N17.9] Acute on chronic respiratory failure with hypoxia (HCC) [J96.21]    Acute kidney injury with proteinuria and hyperkalemia. On chronic kidney disease stage IV, creatinine 3.74, GFR of 18 on 04/30/24 at time of previous discharge.   Etiology of AKI unclear at this time.  Concerning for progression of kidney.   Creatinine remains elevated. Good urine output. No indication for dialysis.   Given patiromer  for potassium.    Lab Results  Component Value Date   CREATININE 4.62 (H) 05/16/2024   CREATININE 4.67 (H) 05/15/2024   CREATININE 4.73 (H) 05/14/2024     Intake/Output Summary (Last 24 hours) at 05/17/2024 1117 Last data filed at 05/17/2024 1039 Gross per 24 hour  Intake 1680 ml  Output 1150 ml  Net 530 ml       2.  Hypertension with chronic kidney disease.  Blood pressure has recovered. 148/69  Currently taken off of all antihypertensive  Started on midodrine  -restarted furosemide .   3.  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 Urine protein to creatinine ratio of 15.4 via Foley specimen.  Proteinuria likely secondary to diabetic nephropathy.  Hemoglobin A1c of 6.0% from 04/14/2024.  Previously diabetes control was suboptimal.     4. Anemia  of chronic kidney disease Lab Results  Component Value Date   HGB 8.4 (L) 05/15/2024    Hemoglobin 8.4.  Hgb stable. Will monitor levels and  consider ESA. Iron  levels on 8/1 are acceptable.    LOS: 14 Deborha Moseley 8/24/202511:17 AM

## 2024-05-17 NOTE — Progress Notes (Signed)
 Progress Note   Patient: Eugene [name] FMW:[id number] DOB: Oct 20, 1960 DOA: 05/02/2024     14 DOS: the patient was seen and examined on 05/17/2024   Brief hospital course:  Medical records reviewed and are as summarized below:   Eugene [name] is a 63 y.o. male with medical history significant for anxiety, depression, type 2 diabetes mellitus, dyslipidemia, hepatitis C, spinal stenosis, IBS, osteomyelitis, status post bilateral BKA, who presented to the emergency room with acute onset of dyspnea with associated facial and periorbital swelling with associated wheezing. He said he was told by the staff at the nursing home but his face was swollen and he was probably having an allergic reaction.  He is not aware of any recent change in diet or medication that will trigger any allergic reaction.  No fever or chills.  The patient was discharged from here on 7/22 after being managed for AKI, catheter associated UTI, hyperkalemia, ileus and acute hypoxic respiratory failure.      Initial vital signs in the ED temperature 99.1 F, respiratory rate 20, pulse 74, BP 122/82, O2 sat 93% on 6 L of oxygen.   Chest x-ray impression:   Overall, unchanged appearance of the bilateral pleural effusions and mid and lower lung airspace disease, likely atelectasis     He was given 1 dose of EpiPen , IV steroids, IV famotidine  and IV fluids for suspected anaphylaxis.   I assumed care on 05/13/24. Further hospital course and management as outlined below.   Assessment and Plan:  Acute hypoxic respiratory failure:  May have underlying chronic hypoxic respiratory failure and require home O2 inhalation on discharge 8/19 worsening of shortness of breath - transferred to stepdown, required heated high flow oxygen at 40-50 L/min.  Now weaned back to 6 L/min. Patient was febrile, suspected infection started antibiotics. CT chest reviewed. 8/22 -- spO2 stable on 6 L/min past few days.  Turned down to 5 >> 4 L/min  this AM. --Continue weaning oxygen --Continue supplemental O2 target sats > 90% --PCCM consulted --Follow cultures and de-escalate antibiotics --Stop Zosyn , continue linezolid  for now   COPD exacerbation. 8/12 Solu-Medrol  40 mg IV x 2 doses followed by prednisone  40 mg p.o. daily x 2 doses, 30 mg pod x 2 doses CT chest showed pleural effusion and atelectasis 8/12 s/p left thoracentesis 100 mL fluid was tapped.  No WBC/organism, culture negative.  High LDH, low protein, partially exudative. --Continue Brovana  neb BID --Scheduled DuoNeb Q6H    AKI on CKD stage IV, with proteinuria Patient is volume overload secondary to CKD Patient may need hemodialysis in near future --Treated w/ IV Lasix  initially as per nephro --Monitor urine output and renal functions daily  Suprapubic catheter -- noted. Abnormal urine culture -- urine culture from admission growing MRSA, but could be colonization.   --Pt on linezolid  empirically, will continue for now   Fluid overload, most likely due to CKD IV and hypoalbuminemia, Acute on chronic diastolic CHF: S/p IV Lasix  x 1 dose on 05/03/2024.   BNP 172.1. 2D echo on 04/19/2024 showed EF estimated at 55 to 60%, indeterminate LV diastolic parameters, left and right atrial were not assessed, technically difficult study. 8/13 increased Lasix  80 mg IV twice daily  --Nephrology following - appreciate recommendations --Currently off diuretics due to worsened renal function --Given gentle IV fluids per nephrology - now off both fluids and diuretics --Daily weights    Hyperkalemia secondary to renal failure.  Resolved  --Veltassa  daily per nephology  --Monitor BMP  Hypoalbuminemia likely contributing to fluid overload from third-spacing edema.    AKI on CKD stage IV versus progressive CKD stage IV:  Cr has been in mid 4's and stable with baseline Cr around 3.74 s/p Iv albumin   --Nephrology following --Expect need for dialysis in future --Diuretics on  hold with Cr increase   Suspected anaphylactic reaction: S/p EpiPen  on 05/02/2024, IV Pepcid  and IV Solu-Medrol .  Difficult to rule in or exclude anaphylactic reaction at this time. Facial swelling seems more likely due to fluid overload.     Type II DM with hyperglycemia: IV steroids likely contributed to severe hyperglycemia. Hemoglobin A1c is 6.0 on 04/14/2024. 8/14 increased Semglee  18 units twice daily and NovoLog  6 units 3 times daily with meals Hyperglycemia due to steroids --Monitor CBG's & titrate regimen for inpatient goal 140-180     Hx pf Hypertension Acute hypotension on 8/19 Patient developed high blood pressure due to fecal impaction and treated with IV Lopressor .  Patient was on oral Lopressor  which was discontinued on 8/19 due to low BP 8/19 hypotension, started midodrine  10 mg p.o. 3 times daily --Reduce midodrine  10 >> 5 mg TID & wean off as BP tolerates --Monitor BP and titrate medications accordingly     Abdominal discomfort, could be gastroparesis versus ileus CT abdomen pelvis negative for any obstruction 8/19 constipation and fecal impaction caused respiratory distress and tachycardia, patient was transferred to ICU.  CT scan showed fecal impaction.  Patient refused enema on 8/18 8/19 agreed for enema  --Continue bowel regimen - miralax  BID, Senna-S BID, suppository daily     Vitamin D  deficiency: started vitamin D  50,000 units p.o. weekly, follow with PCP to repeat vitamin D  level after 3 to 6 months.   Vitamin B12 level 240, at lower end, goal >400,  started vitamin B12 supplement to prevent deficiency.  Repeat B12 level after 3 to 6 months.   Folic acid  level 6.8, at lower end, started folic acid  1 mg p.o. daily to prevent deficiency.  Repeat folic acid  level after 3 to 6 months.   Comorbidities Peripheral neuropathy, hypertension, hypothyroidism, IBS, bilateral BKA   Recent discharge from the hospital on 05/01/2024 after hospitalization for AKI, catheter  associated UTI, suspected pneumonia, acute hypoxic respiratory failure, gastroparesis, ileus, chronic opioid use       Consultants: Nephrology Pulmonology   Procedures: 8/12 s/p left thoracentesis      Subjective: Pt was sleeping comfortably on AM rounds.  RN reported pt has been vaping in his room, now the 3rd vape pen that has been found a placed in bin.  Pt wanted nicotine  patch.   Physical Exam: Vitals:   05/17/24 0500 05/17/24 0727 05/17/24 0818 05/17/24 1222  BP:   123/67 (!) 162/78  Pulse:   86 99  Resp:   16 18  Temp:   97.8 F (36.6 C) 98 F (36.7 C)  TempSrc:      SpO2:  98% (!) 88% 95%  Weight: 128 kg     Height:       General exam: sleeping comfortably, no acute distress HEENT: moist mucus membranes, hearing grossly normal  Respiratory system: normal respiratory effort at rest on 3 L/min HF O2. Cardiovascular system: normal S1/S2, RRR Gastrointestinal system: soft, NT, ND Central nervous system: no gross focal neurologic deficits, normal speech Extremities: bilateral BKA's Skin: dry, intact, normal temperature Psychiatry: normal mood, congruent affect   Data Reviewed:  Notable labs -- Cr 4.62 BUN 61 Ca 7.2 phos 4.9 alubmin  1.7  Family Communication: None present. Will attempt to call as time allows.  Disposition: Status is: Inpatient Remains inpatient appropriate because: awaiting return to his facility when they can accept back. Medically stable.   Planned Discharge Destination: return to prior facility    Time spent: 35 minutes  Author: Burnard DELENA Cunning, DO 05/17/2024 4:09 PM  For on call review [email or web address].

## 2024-05-17 NOTE — Plan of Care (Signed)
  Problem: Nutritional: Goal: Maintenance of adequate nutrition will improve Outcome: Progressing   Problem: Education: Goal: Knowledge of General Education information will improve Description: Including pain rating scale, medication(s)/side effects and non-pharmacologic comfort measures Outcome: Progressing   Problem: Clinical Measurements: Goal: Diagnostic test results will improve Outcome: Progressing

## 2024-05-18 LAB — GLUCOSE, CAPILLARY: Glucose-Capillary: 109 mg/dL — ABNORMAL HIGH (ref 70–99)

## 2024-05-18 NOTE — Progress Notes (Signed)
 Discharge order in. Discharge forms in packet. Transport is here for patient. Attempted multiple times to call SNF at the number provided. Per CM [place] Room 27A, call report to [phone] No response and unable to leave message for call back.

## 2024-05-18 NOTE — Progress Notes (Signed)
 [facility]  ROUNDING NOTE   Subjective:   Eugene [name]  is a 63 y.o.  male  with past medical conditions including diabetes, bilateral BKA, suprapubic catheter, and hepatitis C. Patient presents to ED from SNF with facial edema and has been admitted for Wheezing [R06.2] Anaphylaxis [T78.2XXA] Facial swelling [R22.0] AKI (acute kidney injury) (HCC) [N17.9] Acute on chronic respiratory failure with hypoxia (HCC) [J96.21]  Patient is known to our practice from recent admission. He was released and pending follow up appt in our office at discharge.  Update:  Patient seen laying in bed States he has generalized discomfort 2L [place]   08/24 0701 - 08/25 0700 In: 360 [P.O.:360] Out: 1650 [Urine:1650]  Lab Results  Component Value Date   CREATININE 4.62 (H) 05/16/2024   CREATININE 4.67 (H) 05/15/2024   CREATININE 4.73 (H) 05/14/2024     Objective:  Vital signs in last 24 hours:  Temp:  [98 F (36.7 C)-99.1 F (37.3 C)] 99.1 F (37.3 C) (08/25 0751) Pulse Rate:  [88-99] 88 (08/25 0751) Resp:  [16-20] 20 (08/25 0751) BP: (142-162)/(59-78) 144/59 (08/25 0751) SpO2:  [90 %-95 %] 92 % (08/25 0751) Weight:  [130.8 kg] 130.8 kg (08/25 0500)  Weight change: 2.8 kg Filed Weights   05/16/24 0500 05/17/24 0500 05/18/24 0500  Weight: 126.4 kg 128 kg 130.8 kg    Intake/Output: I/O last 3 completed shifts: In: 1800 [P.O.:1800] Out: 2500 [Urine:2500]   Intake/Output this shift:  Total I/O In: 480 [P.O.:480] Out: -   Physical Exam: General: NAD, laying in bed  Head: Normocephalic  Eyes: Anicteric  Lungs:  Diminished, 2L [place]  Heart: Regular rate   Abdomen:  Distended and firm, obese  Extremities: Trace peripheral edema.,  Bilateral BKA  Neurologic: Awake and alert  Skin: No rashes  Access: None  Suprapubic catheter in place  Basic Metabolic Panel: Recent Labs  Lab 05/11/24 2132 05/12/24 0558 05/13/24 0404 05/14/24 0501 05/15/24 0808 05/16/24 1039   NA 135 139 136 138 141 139  K 5.5* 5.7* 5.4* 5.7* 4.9 4.7  CL 101 104 103 104 105 106  CO2 24 24 25 26 26 24   GLUCOSE 80 78 151* 93 195* 149*  BUN 82* 80* 83* 77* 68* 61*  CREATININE 4.37* 4.53* 4.71* 4.73* 4.67* 4.62*  CALCIUM  7.4* 7.0* 7.0* 7.2* 7.1* 7.2*  MG 1.7 1.7  --   --   --   --   PHOS  --  4.6  --  6.2* 5.1* 4.9*    Liver Function Tests: Recent Labs  Lab 05/14/24 0501 05/15/24 0808 05/16/24 1039  ALBUMIN  1.8* 1.7* 1.7*   No results for input(s): LIPASE, AMYLASE in the last 168 hours.  No results for input(s): AMMONIA in the last 168 hours.  CBC: Recent Labs  Lab 05/11/24 2240 05/12/24 0558 05/13/24 0404 05/14/24 0501 05/15/24 0808  WBC 6.6 8.8 4.6 5.5 4.7  HGB 9.4* 8.4* 8.2* 8.5* 8.4*  HCT 30.1* 27.0* 26.3* 28.2* 26.9*  MCV 93.2 93.8 93.9 96.6 93.1  PLT 137* 125* 118* 124* 136*    Cardiac Enzymes: No results for input(s): CKTOTAL, CKMB, CKMBINDEX, TROPONINI in the last 168 hours.  BNP: Invalid input(s): POCBNP  CBG: Recent Labs  Lab 05/16/24 2009 05/17/24 0818 05/17/24 1222 05/17/24 1629 05/18/24 0746  GLUCAP 115* 136* 141* 121* 109*    Microbiology: Results for orders placed or performed during the hospital encounter of 05/02/24  Resp panel by RT-PCR (RSV, Flu A&B, Covid) Anterior Nasal  Swab     Status: None   Collection Time: 05/02/24  8:06 PM   Specimen: Anterior Nasal Swab  Result Value Ref Range Status   SARS Coronavirus 2 by RT PCR NEGATIVE NEGATIVE Final    Comment: (NOTE) SARS-CoV-2 target nucleic acids are NOT DETECTED.  The SARS-CoV-2 RNA is generally detectable in upper respiratory specimens during the acute phase of infection. The lowest concentration of SARS-CoV-2 viral copies this assay can detect is 138 copies/mL. A negative result does not preclude SARS-Cov-2 infection and should not be used as the sole basis for treatment or other patient management decisions. A negative result may occur with   improper specimen collection/handling, submission of specimen other than nasopharyngeal swab, presence of viral mutation(s) within the areas targeted by this assay, and inadequate number of viral copies(<138 copies/mL). A negative result must be combined with clinical observations, patient history, and epidemiological information. The expected result is Negative.  Fact Sheet for Patients:  BloggerCourse.com  Fact Sheet for Healthcare Providers:  SeriousBroker.it  This test is no t yet approved or cleared by the [place]  FDA and  has been authorized for detection and/or diagnosis of SARS-CoV-2 by FDA under an Emergency Use Authorization (EUA). This EUA will remain  in effect (meaning this test can be used) for the duration of the COVID-19 declaration under Section 564(b)(1) of the Act, 21 U.S.C.section 360bbb-3(b)(1), unless the authorization is terminated  or revoked sooner.       Influenza A by PCR NEGATIVE NEGATIVE Final   Influenza B by PCR NEGATIVE NEGATIVE Final    Comment: (NOTE) The Xpert Xpress SARS-CoV-2/FLU/RSV plus assay is intended as an aid in the diagnosis of influenza from Nasopharyngeal swab specimens and should not be used as a sole basis for treatment. Nasal washings and aspirates are unacceptable for Xpert Xpress SARS-CoV-2/FLU/RSV testing.  Fact Sheet for Patients: BloggerCourse.com  Fact Sheet for Healthcare Providers: SeriousBroker.it  This test is not yet approved or cleared by the [place]  FDA and has been authorized for detection and/or diagnosis of SARS-CoV-2 by FDA under an Emergency Use Authorization (EUA). This EUA will remain in effect (meaning this test can be used) for the duration of the COVID-19 declaration under Section 564(b)(1) of the Act, 21 U.S.C. section 360bbb-3(b)(1), unless the authorization is terminated or revoked.      Resp Syncytial Virus by PCR NEGATIVE NEGATIVE Final    Comment: (NOTE) Fact Sheet for Patients: BloggerCourse.com  Fact Sheet for Healthcare Providers: SeriousBroker.it  This test is not yet approved or cleared by the [place]  FDA and has been authorized for detection and/or diagnosis of SARS-CoV-2 by FDA under an Emergency Use Authorization (EUA). This EUA will remain in effect (meaning this test can be used) for the duration of the COVID-19 declaration under Section 564(b)(1) of the Act, 21 U.S.C. section 360bbb-3(b)(1), unless the authorization is terminated or revoked.  Performed at [facility], [address] [place], [place] [address]   Body fluid culture w Gram Stain     Status: None   Collection Time: 05/05/24  4:10 PM   Specimen: PATH Cytology Pleural fluid  Result Value Ref Range Status   Specimen Description   Final    FLUID Performed at [facility], [address] [place], [place] [address]    Special Requests   Final    CYTO PLEU Performed at [facility], [address] [place], [place] [address]    Gram Stain NO WBC SEEN NO  ORGANISMS SEEN CYTOSPIN SMEAR   Final   Culture   Final    NO GROWTH 3 DAYS Performed at [facility], [address], [place], [place] [address]    Report Status 05/09/2024 FINAL  Final  Urine Culture (for pregnant, neutropenic or urologic patients or patients with an indwelling urinary catheter)     Status: Abnormal   Collection Time: 05/11/24  5:10 PM   Specimen: Urine, Catheterized  Result Value Ref Range Status   Specimen Description   Final    URINE, CATHETERIZED Performed at [facility], [address] [place], [place] [address]    Special Requests   Final    NONE Performed at [facility], [address] [place], [place] [address]    Culture (A)  Final    >=100,000 COLONIES/mL STAPHYLOCOCCUS  AUREUS METHICILLIN RESISTANT STAPHYLOCOCCUS AUREUS    Report Status 05/14/2024 FINAL  Final   Organism ID, Bacteria STAPHYLOCOCCUS AUREUS (A)  Final      Susceptibility   Staphylococcus aureus - MIC*    CIPROFLOXACIN  >=8 RESISTANT Resistant     GENTAMICIN <=0.5 SENSITIVE Sensitive     NITROFURANTOIN <=16 SENSITIVE Sensitive     OXACILLIN >=4 RESISTANT Resistant     TETRACYCLINE <=1 SENSITIVE Sensitive     VANCOMYCIN  1 SENSITIVE Sensitive     TRIMETH /SULFA  <=10 SENSITIVE Sensitive     RIFAMPIN  <=0.5 SENSITIVE Sensitive     Inducible Clindamycin NEGATIVE Sensitive     LINEZOLID  2 SENSITIVE Sensitive     * >=100,000 COLONIES/mL STAPHYLOCOCCUS AUREUS  MRSA Next Gen by PCR, Nasal     Status: Abnormal   Collection Time: 05/11/24  9:08 PM   Specimen: Nasal Mucosa; Nasal Swab  Result Value Ref Range Status   MRSA by PCR Next Gen DETECTED (A) NOT DETECTED Final    Comment: RESULT CALLED TO, READ BACK BY AND VERIFIED WITH:  HOLLY CHANDLER AT 0007 05/12/24 JG (NOTE) The GeneXpert MRSA Assay (FDA approved for NASAL specimens only), is one component of a comprehensive MRSA colonization surveillance program. It is not intended to diagnose MRSA infection nor to guide or monitor treatment for MRSA infections. Test performance is not FDA approved in patients less than 70 years old. Performed at [facility], [address] [place], [place] [address]   Culture, blood (Routine X 2) w Reflex to ID Panel     Status: None   Collection Time: 05/11/24  9:32 PM   Specimen: BLOOD  Result Value Ref Range Status   Specimen Description BLOOD BLOOD RIGHT HAND  Final   Special Requests   Final    BOTTLES DRAWN AEROBIC AND ANAEROBIC Blood Culture results may not be optimal due to an inadequate volume of blood received in culture bottles   Culture   Final    NO GROWTH 5 DAYS Performed at [facility], [address], [place], [place] [address]    Report Status 05/16/2024 FINAL  Final   Culture, blood (Routine X 2) w Reflex to ID Panel     Status: None   Collection Time: 05/11/24  9:32 PM   Specimen: BLOOD  Result Value Ref Range Status   Specimen Description BLOOD BLOOD RIGHT ARM  Final   Special Requests   Final    BOTTLES DRAWN AEROBIC AND ANAEROBIC Blood Culture adequate volume   Culture   Final    NO GROWTH 5 DAYS Performed at [facility], [address] [place], [place]  [address]    Report Status 05/16/2024 FINAL  Final    Coagulation Studies: No results for input(s): LABPROT, INR in the last 72 hours.  Urinalysis: No results for input(s): COLORURINE, LABSPEC, PHURINE, GLUCOSEU, HGBUR, BILIRUBINUR, KETONESUR, PROTEINUR, UROBILINOGEN, NITRITE, LEUKOCYTESUR in the last 72 hours.  Invalid input(s): APPERANCEUR     Imaging: No results found.    Medications:        arformoterol   15 mcg Nebulization BID   bisacodyl   10 mg Oral QHS   bisacodyl   10 mg Rectal Daily   Chlorhexidine  Gluconate Cloth  6 each Topical QHS   vitamin B-12  1,000 mcg Oral Daily   famotidine   10 mg Oral Daily   feeding supplement  237 mL Oral BID BM   folic acid   1 mg Oral Daily   furosemide   40 mg Oral Daily   gabapentin   300 mg Oral QHS   heparin  injection (subcutaneous)  5,000 Units Subcutaneous Q8H   insulin  aspart  0-15 Units Subcutaneous TID WC   insulin  aspart  4 Units Subcutaneous TID WC   insulin  glargine  14 Units Subcutaneous BID   ipratropium-albuterol   3 mL Nebulization BID   levothyroxine   175 mcg Oral Q0600   linaclotide   145 mcg Oral QAC breakfast   linagliptin   5 mg Oral Daily   linezolid   600 mg Oral Q12H   melatonin  10 mg Oral QHS   metoCLOPramide   5 mg Oral TID AC   nicotine   14 mg Transdermal Daily   patiromer   16.8 g Oral Daily   polyethylene glycol  17 g Oral BID   senna-docusate  1 tablet Oral BID   Vitamin D  (Ergocalciferol )  50,000 Units Oral Q7 days   acetaminophen  **OR** acetaminophen , albuterol ,  hydrALAZINE  **OR** hydrALAZINE , metoprolol  tartrate, ondansetron  **OR** ondansetron  (ZOFRAN ) IV, mouth rinse, oxyCODONE , sodium chloride  flush, traZODone   Assessment/ Plan:  [name] is a 63 y.o.  male  with past medical conditions including diabetes, bilateral BKA, suprapubic catheter, hepatitis C, who was admitted to [facility] on 05/02/2024 for Wheezing [R06.2] Anaphylaxis [T78.2XXA] Facial swelling [R22.0] AKI (acute kidney injury) (HCC) [N17.9] Acute on chronic respiratory failure with hypoxia (HCC) [J96.21]    Acute kidney injury with proteinuria and hyperkalemia. On chronic kidney disease stage IV, creatinine 3.74, GFR of 18 on 04/30/24 at time of previous discharge.   Etiology of AKI unclear at this time.  Concerning for progression of kidney.   Creatinine elevated but stable Good urine output No immediate need for dialysis Will continue to monitor patient in office at discharge.    Lab Results  Component Value Date   CREATININE 4.62 (H) 05/16/2024   CREATININE 4.67 (H) 05/15/2024   CREATININE 4.73 (H) 05/14/2024     Intake/Output Summary (Last 24 hours) at 05/18/2024 1129 Last data filed at 05/18/2024 0900 Gross per 24 hour  Intake 600 ml  Output 1650 ml  Net -1050 ml       2.  Hypertension with chronic kidney disease.  Blood pressure decreased and started on Midodrine  during this admission. Has resolved.  Stopped midodrine  -Furosemide  restarted  3.  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 Urine protein to creatinine ratio of 15.4 via Foley specimen.  Proteinuria likely secondary to diabetic nephropathy.  Hemoglobin A1c of 6.0% from 04/14/2024.  Previously diabetes control was suboptimal.     4. Anemia of chronic kidney disease Lab Results  Component Value Date   HGB 8.4 (L) 05/15/2024  Hemoglobin 8.4.  Hgb stable. Will monitor levels and consider ESA. Iron  levels on 8/1 are acceptable.    LOS:  15 Donivin Wirt 8/25/202511:29 AM

## 2024-05-18 NOTE — TOC Progression Note (Signed)
 Transition of Care [facility]) - Progression Note    Patient Details  Name: Eugene [name] MRN: [id number] Date of Birth: January 11, 1961  Transition [facility] CM/SW Contact  Racheal LITTIE Schimke, RN Phone Number: 05/18/2024, 11:23 AM    Expected Discharge Plan: Skilled Nursing Facility Barriers to Discharge: Barriers Resolved               Expected Discharge Plan and Services     Post Acute Care Choice: Resumption of Svcs/PTA Provider Living arrangements for the past 2 months: Skilled Nursing Facility Expected Discharge Date: 05/18/24               DME Arranged: N/A DME Agency: NA       HH Arranged: NA HH Agency: NA         Social Drivers of Health (SDOH) Interventions SDOH Screenings   Food Insecurity: Patient Declined (05/03/2024)  Housing: Low Risk  (05/03/2024)  Transportation Needs: No Transportation Needs (05/03/2024)  Financial Resource Strain: Medium Risk (02/02/2019)  Physical Activity: Unknown (02/02/2019)  Social Connections: Somewhat Isolated (02/02/2019)  Stress: Stress Concern Present (02/02/2019)  Tobacco Use: High Risk (05/02/2024)    Readmission Risk Interventions    05/05/2024    3:05 PM  Readmission Risk Prevention Plan  Transportation Screening Complete  Medication Review (RN Care Manager) Complete  PCP or Specialist appointment within 3-5 days of discharge Complete  SW Recovery Care/Counseling Consult Complete  Palliative Care Screening Not Applicable  Skilled Nursing Facility Complete

## 2024-05-18 NOTE — Plan of Care (Signed)

## 2024-05-21 ENCOUNTER — Inpatient Hospital Stay
Admission: EM | Admit: 2024-05-21 | Discharge: 2024-05-28 | DRG: 673 | Disposition: A | Discharge status: Skilled Nursing Facility | Source: Skilled Nursing Facility | Attending: Internal Medicine | Admitting: Internal Medicine

## 2024-05-21 ENCOUNTER — Emergency Department

## 2024-05-21 DIAGNOSIS — Z79899 Other long term (current) drug therapy: Secondary | ICD-10-CM

## 2024-05-21 DIAGNOSIS — D696 Thrombocytopenia, unspecified: Secondary | ICD-10-CM | POA: Diagnosis not present

## 2024-05-21 DIAGNOSIS — B182 Chronic viral hepatitis C: Secondary | ICD-10-CM | POA: Diagnosis present

## 2024-05-21 DIAGNOSIS — E039 Hypothyroidism, unspecified: Secondary | ICD-10-CM | POA: Diagnosis present

## 2024-05-21 DIAGNOSIS — Z79890 Hormone replacement therapy (postmenopausal): Secondary | ICD-10-CM

## 2024-05-21 DIAGNOSIS — I5033 Acute on chronic diastolic (congestive) heart failure: Secondary | ICD-10-CM | POA: Diagnosis present

## 2024-05-21 DIAGNOSIS — Z5986 Financial insecurity: Secondary | ICD-10-CM

## 2024-05-21 DIAGNOSIS — I10 Essential (primary) hypertension: Secondary | ICD-10-CM | POA: Diagnosis present

## 2024-05-21 DIAGNOSIS — Z9359 Other cystostomy status: Secondary | ICD-10-CM

## 2024-05-21 DIAGNOSIS — E875 Hyperkalemia: Secondary | ICD-10-CM | POA: Diagnosis present

## 2024-05-21 DIAGNOSIS — E1169 Type 2 diabetes mellitus with other specified complication: Secondary | ICD-10-CM | POA: Diagnosis present

## 2024-05-21 DIAGNOSIS — Z6841 Body Mass Index (BMI) 40.0 and over, adult: Secondary | ICD-10-CM

## 2024-05-21 DIAGNOSIS — J9601 Acute respiratory failure with hypoxia: Secondary | ICD-10-CM | POA: Diagnosis present

## 2024-05-21 DIAGNOSIS — R0902 Hypoxemia: Principal | ICD-10-CM

## 2024-05-21 DIAGNOSIS — D509 Iron deficiency anemia, unspecified: Secondary | ICD-10-CM | POA: Diagnosis present

## 2024-05-21 DIAGNOSIS — D631 Anemia in chronic kidney disease: Secondary | ICD-10-CM | POA: Diagnosis present

## 2024-05-21 DIAGNOSIS — Z89512 Acquired absence of left leg below knee: Secondary | ICD-10-CM

## 2024-05-21 DIAGNOSIS — N189 Chronic kidney disease, unspecified: Secondary | ICD-10-CM | POA: Diagnosis present

## 2024-05-21 DIAGNOSIS — Z981 Arthrodesis status: Secondary | ICD-10-CM

## 2024-05-21 DIAGNOSIS — G894 Chronic pain syndrome: Secondary | ICD-10-CM | POA: Diagnosis present

## 2024-05-21 DIAGNOSIS — Z89511 Acquired absence of right leg below knee: Secondary | ICD-10-CM

## 2024-05-21 DIAGNOSIS — Z7984 Long term (current) use of oral hypoglycemic drugs: Secondary | ICD-10-CM

## 2024-05-21 DIAGNOSIS — N186 End stage renal disease: Secondary | ICD-10-CM | POA: Diagnosis present

## 2024-05-21 DIAGNOSIS — E1122 Type 2 diabetes mellitus with diabetic chronic kidney disease: Secondary | ICD-10-CM | POA: Diagnosis present

## 2024-05-21 DIAGNOSIS — E785 Hyperlipidemia, unspecified: Secondary | ICD-10-CM | POA: Diagnosis present

## 2024-05-21 DIAGNOSIS — I132 Hypertensive heart and chronic kidney disease with heart failure and with stage 5 chronic kidney disease, or end stage renal disease: Secondary | ICD-10-CM | POA: Diagnosis present

## 2024-05-21 DIAGNOSIS — E1121 Type 2 diabetes mellitus with diabetic nephropathy: Secondary | ICD-10-CM | POA: Diagnosis present

## 2024-05-21 DIAGNOSIS — I471 Supraventricular tachycardia, unspecified: Secondary | ICD-10-CM

## 2024-05-21 DIAGNOSIS — J189 Pneumonia, unspecified organism: Secondary | ICD-10-CM | POA: Diagnosis not present

## 2024-05-21 DIAGNOSIS — E66813 Obesity, class 3: Secondary | ICD-10-CM | POA: Diagnosis present

## 2024-05-21 DIAGNOSIS — D649 Anemia, unspecified: Secondary | ICD-10-CM | POA: Diagnosis present

## 2024-05-21 DIAGNOSIS — Z841 Family history of disorders of kidney and ureter: Secondary | ICD-10-CM

## 2024-05-21 DIAGNOSIS — Z794 Long term (current) use of insulin: Secondary | ICD-10-CM

## 2024-05-21 DIAGNOSIS — Z992 Dependence on renal dialysis: Secondary | ICD-10-CM

## 2024-05-21 DIAGNOSIS — J9801 Acute bronchospasm: Secondary | ICD-10-CM | POA: Diagnosis present

## 2024-05-21 DIAGNOSIS — Z723 Lack of physical exercise: Secondary | ICD-10-CM

## 2024-05-21 DIAGNOSIS — N179 Acute kidney failure, unspecified: Principal | ICD-10-CM | POA: Diagnosis present

## 2024-05-21 DIAGNOSIS — E8809 Other disorders of plasma-protein metabolism, not elsewhere classified: Secondary | ICD-10-CM | POA: Diagnosis present

## 2024-05-21 DIAGNOSIS — F17210 Nicotine dependence, cigarettes, uncomplicated: Secondary | ICD-10-CM | POA: Diagnosis present

## 2024-05-21 DIAGNOSIS — Z66 Do not resuscitate: Secondary | ICD-10-CM | POA: Diagnosis present

## 2024-05-21 DIAGNOSIS — E1142 Type 2 diabetes mellitus with diabetic polyneuropathy: Secondary | ICD-10-CM | POA: Diagnosis present

## 2024-05-21 LAB — CBC WITH DIFFERENTIAL/PLATELET
Abs Immature Granulocytes: 0.07 K/uL (ref 0.00–0.07)
Basophils Absolute: 0 K/uL (ref 0.0–0.1)
Basophils Relative: 0 %
Eosinophils Absolute: 0.2 K/uL (ref 0.0–0.5)
Eosinophils Relative: 3 %
HCT: 27.9 % — ABNORMAL LOW (ref 39.0–52.0)
Hemoglobin: 8.7 g/dL — ABNORMAL LOW (ref 13.0–17.0)
Immature Granulocytes: 1 %
Lymphocytes Relative: 23 %
Lymphs Abs: 1.4 K/uL (ref 0.7–4.0)
MCH: 29.6 pg (ref 26.0–34.0)
MCHC: 31.2 g/dL (ref 30.0–36.0)
MCV: 94.9 fL (ref 80.0–100.0)
Monocytes Absolute: 0.6 K/uL (ref 0.1–1.0)
Monocytes Relative: 10 %
Neutro Abs: 3.8 K/uL (ref 1.7–7.7)
Neutrophils Relative %: 63 %
Platelets: 218 K/uL (ref 150–400)
RBC: 2.94 MIL/uL — ABNORMAL LOW (ref 4.22–5.81)
RDW: 12.9 % (ref 11.5–15.5)
WBC: 6 K/uL (ref 4.0–10.5)
nRBC: 0 % (ref 0.0–0.2)

## 2024-05-21 LAB — LACTIC ACID, PLASMA: Lactic Acid, Venous: 0.9 mmol/L (ref 0.5–1.9)

## 2024-05-21 LAB — BLOOD GAS, VENOUS

## 2024-05-21 MED ORDER — IPRATROPIUM-ALBUTEROL 0.5-2.5 (3) MG/3ML IN SOLN
3.0000 mL | Freq: Once | RESPIRATORY_TRACT | Status: AC
  Administered 2024-05-21: 3 mL via RESPIRATORY_TRACT
  Filled 2024-05-21: qty 3

## 2024-05-21 MED ORDER — METHYLPREDNISOLONE SODIUM SUCC 125 MG IJ SOLR
125.0000 mg | Freq: Once | INTRAMUSCULAR | Status: AC
  Administered 2024-05-21: 125 mg via INTRAVENOUS
  Filled 2024-05-21: qty 2

## 2024-05-21 NOTE — ED Triage Notes (Signed)
 Pt arrived via EMS from [facility]. Per pt's NP You need dialysis now. Pt is also having respiratory depression. Pt is on a non rebreather sating at 96%. Pt is tachypnic and using accessory muscles . Pt is a bilateral above the knee amputee and has a chronic foley.

## 2024-05-21 NOTE — ED Provider Notes (Signed)
 [facility] Provider Note    Event Date/Time   First MD Initiated Contact with Patient 05/21/24 2228     (approximate)   History   No chief complaint on file.   HPI  Eugene [name] is a 63 y.o. male  with medical history significant for anxiety, depression, type 2 diabetes mellitus, dyslipidemia, hepatitis C, spinal stenosis, IBS, osteomyelitis, status post bilateral BKA recently admitted and discharged on 8/23 for respiratory failure, allergic reaction, bronchospasm and possible early pneumonia discharged to skilled nursing facility who presents with 24 hours of progressively worsening shortness of breath.  He denies any chest pain but states that he feels slightly bloated.  He states that his NP at the skilled nursing facility told him he needed dialysis now.  Per EMS he was hypoxic on arrival and arrives on a nonrebreather.      Physical Exam   Triage Vital Signs: ED Triage Vitals  Encounter Vitals Group     BP 05/21/24 2215 (!) 196/92     Girls Systolic BP Percentile --      Girls Diastolic BP Percentile --      Boys Systolic BP Percentile --      Boys Diastolic BP Percentile --      Pulse Rate 05/21/24 2215 (!) 101     Resp 05/21/24 2215 (!) 25     Temp 05/21/24 2215 99.5 F (37.5 C)     Temp Source 05/21/24 2215 Oral     SpO2 05/21/24 2214 98 %     Weight 05/21/24 2216 294 lb 1.5 oz (133.4 kg)     Height --      Head Circumference --      Peak Flow --      Pain Score 05/21/24 2216 0     Pain Loc --      Pain Education --      Exclude from Growth Chart --     Most recent vital signs: Vitals:   05/21/24 2215 05/21/24 2325  BP: (!) 196/92   Pulse: (!) 101   Resp: (!) 25   Temp: 99.5 F (37.5 C)   SpO2: 98% 94%    Nursing Triage Note reviewed. Vital signs reviewed and patients oxygen saturation is hypoxic  General: Patient is well nourished, well developed, awake and alert, appears unwell  head: Normocephalic and  atraumatic Eyes: Normal inspection, extraocular muscles intact, no conjunctival pallor Ear, nose, throat: Normal external exam Neck: Normal range of motion Respiratory: Patient is in moderate respiratory distress, lungs wheezes throughout not moving great air Cardiovascular: Patient is tachycardic, RR  GI: Abd SNT with no guarding or rebound slightly distended GU: Chronic Foley in place Extremities: Bilateral BKA's Neuro: The patient is alert and oriented to person, place, and time, appropriately conversive, with 5/5 bilat UE/LE strength, no gross motor or sensory defects noted. Coordination appears to be adequate. Skin: Warm, dry, and intact Psych: Anxious mood mood and affect, no SI or HI  ED Results / Procedures / Treatments   Labs (all labs ordered are listed, but only abnormal results are displayed) Labs Reviewed  BLOOD GAS, VENOUS - Abnormal; Notable for the following components:      Result Value   pO2, Ven <31 (*)    All other components within normal limits  CBC WITH DIFFERENTIAL/PLATELET - Abnormal; Notable for the following components:   RBC 2.94 (*)    Hemoglobin 8.7 (*)    HCT 27.9 (*)    All  other components within normal limits  COMPREHENSIVE METABOLIC PANEL WITH GFR - Abnormal; Notable for the following components:   Potassium 5.3 (*)    Glucose, Bld 166 (*)    BUN 49 (*)    Creatinine, Ser 4.91 (*)    Calcium  7.4 (*)    Total Protein 5.6 (*)    Albumin  1.8 (*)    GFR, Estimated 13 (*)    All other components within normal limits  TROPONIN I (HIGH SENSITIVITY) - Abnormal; Notable for the following components:   Troponin I (High Sensitivity) 24 (*)    All other components within normal limits  CULTURE, BLOOD (ROUTINE X 2)  CULTURE, BLOOD (ROUTINE X 2)  LACTIC ACID, PLASMA  BRAIN NATRIURETIC PEPTIDE  URINALYSIS, ROUTINE W REFLEX MICROSCOPIC  LACTIC ACID, PLASMA  TROPONIN I (HIGH SENSITIVITY)     EKG EKG and rhythm strip are interpreted by myself:    EKG: [tachycardic sinus rhythm] at heart rate of 102 , normal QRS duration, QTc 430, nonspecific ST segments and T waves no ectopy EKG not consistent with Acute STEMI Rhythm strip: tachcyardic in lead II   RADIOLOGY Xray chest: No acute abnormality on my independent review interpretation and radiologist agrees   PROCEDURES:  Critical Care performed: No  Procedures   MEDICATIONS ORDERED IN ED: Medications  methylPREDNISolone  sodium succinate (SOLU-MEDROL ) 125 mg/2 mL injection 125 mg (125 mg Intravenous Given 05/21/24 2323)  ipratropium-albuterol  (DUONEB) 0.5-2.5 (3) MG/3ML nebulizer solution 3 mL (3 mLs Nebulization Given 05/21/24 2331)     IMPRESSION / MDM / ASSESSMENT AND PLAN / ED COURSE                                Differential diagnosis includes, but is not limited to, bronchospasm, flash pulmonary edema, CHF, anemia, pneumonia  ED course: Patient arrives acutely on a nonrebreather.  Pulmonary exam is consistent with bronchospasm and less pulmonary edema.  125 mg of Solu-Medrol  was administered along with breathing treatment.  Initial plan was for BiPAP however patient does not want to pursue this at this time and consequently patient placed on high flow   Clinical Course as of 05/22/24 0004  Thu May 21, 2024  2341 Blood gas, venous(!!) Not acidotic [HD]  2341 CBC with Differential(!) No leukocytosis [HD]  Fri May 22, 2024  0004 Patient signed out to oncoming physician pending return of blood work [HD]    Clinical Course User Index [HD] Nicholaus Rolland BRAVO, MD     FINAL CLINICAL IMPRESSION(S) / ED DIAGNOSES   Final diagnoses:  Hypoxia  Bronchospasm     Rx / DC Orders   ED Discharge Orders     None        Note:  This document was prepared using Dragon voice recognition software and may include unintentional dictation errors.   Nicholaus Rolland BRAVO, MD 05/22/24 [phone]

## 2024-05-21 NOTE — ED Notes (Addendum)
 Placed fall bundle and nurses was at bedside with pt

## 2024-05-22 ENCOUNTER — Encounter: Payer: Self-pay | Admitting: Family Medicine

## 2024-05-22 ENCOUNTER — Encounter
Admission: EM | Disposition: A | Discharge status: Skilled Nursing Facility | Payer: Self-pay | Source: Skilled Nursing Facility | Attending: Internal Medicine

## 2024-05-22 DIAGNOSIS — N186 End stage renal disease: Secondary | ICD-10-CM

## 2024-05-22 DIAGNOSIS — E1122 Type 2 diabetes mellitus with diabetic chronic kidney disease: Secondary | ICD-10-CM | POA: Diagnosis present

## 2024-05-22 DIAGNOSIS — B182 Chronic viral hepatitis C: Secondary | ICD-10-CM

## 2024-05-22 DIAGNOSIS — I10 Essential (primary) hypertension: Secondary | ICD-10-CM

## 2024-05-22 DIAGNOSIS — J9801 Acute bronchospasm: Secondary | ICD-10-CM | POA: Diagnosis not present

## 2024-05-22 DIAGNOSIS — Z794 Long term (current) use of insulin: Secondary | ICD-10-CM | POA: Diagnosis not present

## 2024-05-22 DIAGNOSIS — Z992 Dependence on renal dialysis: Secondary | ICD-10-CM

## 2024-05-22 DIAGNOSIS — I471 Supraventricular tachycardia, unspecified: Secondary | ICD-10-CM | POA: Diagnosis not present

## 2024-05-22 DIAGNOSIS — Z9359 Other cystostomy status: Secondary | ICD-10-CM | POA: Diagnosis not present

## 2024-05-22 DIAGNOSIS — Z89511 Acquired absence of right leg below knee: Secondary | ICD-10-CM

## 2024-05-22 DIAGNOSIS — E785 Hyperlipidemia, unspecified: Secondary | ICD-10-CM | POA: Diagnosis present

## 2024-05-22 DIAGNOSIS — E1142 Type 2 diabetes mellitus with diabetic polyneuropathy: Secondary | ICD-10-CM | POA: Diagnosis present

## 2024-05-22 DIAGNOSIS — E875 Hyperkalemia: Secondary | ICD-10-CM | POA: Diagnosis present

## 2024-05-22 DIAGNOSIS — D631 Anemia in chronic kidney disease: Secondary | ICD-10-CM | POA: Diagnosis present

## 2024-05-22 DIAGNOSIS — D649 Anemia, unspecified: Secondary | ICD-10-CM

## 2024-05-22 DIAGNOSIS — N179 Acute kidney failure, unspecified: Secondary | ICD-10-CM | POA: Diagnosis present

## 2024-05-22 DIAGNOSIS — D696 Thrombocytopenia, unspecified: Secondary | ICD-10-CM | POA: Diagnosis not present

## 2024-05-22 DIAGNOSIS — E8809 Other disorders of plasma-protein metabolism, not elsewhere classified: Secondary | ICD-10-CM | POA: Diagnosis present

## 2024-05-22 DIAGNOSIS — E66813 Obesity, class 3: Secondary | ICD-10-CM | POA: Insufficient documentation

## 2024-05-22 DIAGNOSIS — G894 Chronic pain syndrome: Secondary | ICD-10-CM

## 2024-05-22 DIAGNOSIS — I5033 Acute on chronic diastolic (congestive) heart failure: Secondary | ICD-10-CM | POA: Diagnosis present

## 2024-05-22 DIAGNOSIS — E1121 Type 2 diabetes mellitus with diabetic nephropathy: Secondary | ICD-10-CM | POA: Diagnosis present

## 2024-05-22 DIAGNOSIS — R0902 Hypoxemia: Secondary | ICD-10-CM | POA: Diagnosis not present

## 2024-05-22 DIAGNOSIS — J189 Pneumonia, unspecified organism: Secondary | ICD-10-CM | POA: Diagnosis not present

## 2024-05-22 DIAGNOSIS — Z89512 Acquired absence of left leg below knee: Secondary | ICD-10-CM

## 2024-05-22 DIAGNOSIS — J9601 Acute respiratory failure with hypoxia: Secondary | ICD-10-CM

## 2024-05-22 DIAGNOSIS — E039 Hypothyroidism, unspecified: Secondary | ICD-10-CM | POA: Diagnosis present

## 2024-05-22 DIAGNOSIS — Z66 Do not resuscitate: Secondary | ICD-10-CM | POA: Diagnosis present

## 2024-05-22 DIAGNOSIS — I132 Hypertensive heart and chronic kidney disease with heart failure and with stage 5 chronic kidney disease, or end stage renal disease: Secondary | ICD-10-CM | POA: Diagnosis present

## 2024-05-22 DIAGNOSIS — Z6841 Body Mass Index (BMI) 40.0 and over, adult: Secondary | ICD-10-CM | POA: Diagnosis not present

## 2024-05-22 HISTORY — PX: DIALYSIS/PERMA CATHETER INSERTION: CATH118288

## 2024-05-22 LAB — HEPATITIS B SURFACE ANTIGEN: Hepatitis B Surface Ag: NONREACTIVE

## 2024-05-22 LAB — COMPREHENSIVE METABOLIC PANEL WITH GFR
ALT: 14 U/L (ref 0–44)
AST: 16 U/L (ref 15–41)
Albumin: 1.8 g/dL — ABNORMAL LOW (ref 3.5–5.0)
Alkaline Phosphatase: 49 U/L (ref 38–126)
Anion gap: 10 (ref 5–15)
BUN: 49 mg/dL — ABNORMAL HIGH (ref 8–23)
CO2: 23 mmol/L (ref 22–32)
Calcium: 7.4 mg/dL — ABNORMAL LOW (ref 8.9–10.3)
Chloride: 106 mmol/L (ref 98–111)
Creatinine, Ser: 4.91 mg/dL — ABNORMAL HIGH (ref 0.61–1.24)
GFR, Estimated: 13 mL/min — ABNORMAL LOW (ref >60)
Glucose, Bld: 166 mg/dL — ABNORMAL HIGH (ref 70–99)
Potassium: 5.3 mmol/L — ABNORMAL HIGH (ref 3.5–5.1)
Sodium: 139 mmol/L (ref 135–145)
Total Bilirubin: 0.4 mg/dL (ref 0.0–1.2)
Total Protein: 5.6 g/dL — ABNORMAL LOW (ref 6.5–8.1)

## 2024-05-22 LAB — BASIC METABOLIC PANEL WITH GFR
Anion gap: 9 (ref 5–15)
BUN: 51 mg/dL — ABNORMAL HIGH (ref 8–23)
CO2: 24 mmol/L (ref 22–32)
Calcium: 7.4 mg/dL — ABNORMAL LOW (ref 8.9–10.3)
Chloride: 107 mmol/L (ref 98–111)
Creatinine, Ser: 5.03 mg/dL — ABNORMAL HIGH (ref 0.61–1.24)
GFR, Estimated: 12 mL/min — ABNORMAL LOW (ref >60)
Glucose, Bld: 175 mg/dL — ABNORMAL HIGH (ref 70–99)
Potassium: 5.6 mmol/L — ABNORMAL HIGH (ref 3.5–5.1)
Sodium: 140 mmol/L (ref 135–145)

## 2024-05-22 LAB — URINALYSIS, ROUTINE W REFLEX MICROSCOPIC
Bilirubin Urine: NEGATIVE
Glucose, UA: 150 mg/dL — AB
Ketones, ur: NEGATIVE mg/dL
Nitrite: NEGATIVE
Protein, ur: >=300 mg/dL — AB
RBC / HPF: >50 RBC/hpf (ref 0–5)
Specific Gravity, Urine: 1.017 (ref 1.005–1.030)
WBC, UA: >50 WBC/hpf (ref 0–5)
pH: 6 (ref 5.0–8.0)

## 2024-05-22 LAB — TSH: TSH: 3.383 u[IU]/mL (ref 0.350–4.500)

## 2024-05-22 LAB — CBC
HCT: 25 % — ABNORMAL LOW (ref 39.0–52.0)
Hemoglobin: 7.9 g/dL — ABNORMAL LOW (ref 13.0–17.0)
MCH: 29.6 pg (ref 26.0–34.0)
MCHC: 31.6 g/dL (ref 30.0–36.0)
MCV: 93.6 fL (ref 80.0–100.0)
Platelets: 218 K/uL (ref 150–400)
RBC: 2.67 MIL/uL — ABNORMAL LOW (ref 4.22–5.81)
RDW: 12.9 % (ref 11.5–15.5)
WBC: 3.8 K/uL — ABNORMAL LOW (ref 4.0–10.5)
nRBC: 0 % (ref 0.0–0.2)

## 2024-05-22 LAB — BLOOD GAS, VENOUS
Bicarbonate: 26.3 mmol/L (ref 20.0–28.0)
O2 Content: 5 L/min
O2 Saturation: 48.7 mmol/L (ref 0.0–2.0)
Patient temperature: 37
Patient temperature: 48.7
pCO2, Ven: 51 mmHg (ref 44–60)
pH, Ven: 7.32 (ref 7.25–7.43)
pO2, Ven: <31 mmol/L — AB (ref 32–45)

## 2024-05-22 LAB — BRAIN NATRIURETIC PEPTIDE: B Natriuretic Peptide: 456.6 pg/mL — ABNORMAL HIGH (ref 0.0–100.0)

## 2024-05-22 LAB — GLUCOSE, CAPILLARY
Glucose-Capillary: 191 mg/dL — ABNORMAL HIGH (ref 70–99)
Glucose-Capillary: 182 mg/dL — ABNORMAL HIGH (ref 70–99)
Glucose-Capillary: 153 mg/dL — ABNORMAL HIGH (ref 70–99)
Glucose-Capillary: 149 mg/dL — ABNORMAL HIGH (ref 70–99)

## 2024-05-22 LAB — TROPONIN I (HIGH SENSITIVITY)
Troponin I (High Sensitivity): 24 ng/L — ABNORMAL HIGH (ref <18)
Troponin I (High Sensitivity): 25 ng/L — ABNORMAL HIGH (ref <18)

## 2024-05-22 SURGERY — DIALYSIS/PERMA CATHETER INSERTION
Anesthesia: Moderate Sedation

## 2024-05-22 MED ORDER — DIPHENHYDRAMINE HCL 50 MG/ML IJ SOLN: 50.0000 mg | Freq: Once | INTRAMUSCULAR | Status: DC | PRN

## 2024-05-22 MED ORDER — OXYCODONE HCL 5 MG PO TABS
10.0000 mg | ORAL_TABLET | Freq: Once | ORAL | Status: AC
  Administered 2024-05-22: 10 mg via ORAL
  Filled 2024-05-22: qty 2

## 2024-05-22 MED ORDER — ACETAMINOPHEN 650 MG RE SUPP
650.0000 mg | Freq: Four times a day (QID) | RECTAL | Status: DC | PRN
Start: 2024-05-22 — End: 2024-05-28

## 2024-05-22 MED ORDER — ACETAMINOPHEN 325 MG PO TABS: 650.0000 mg | ORAL_TABLET | Freq: Four times a day (QID) | ORAL | Status: DC | PRN

## 2024-05-22 MED ORDER — CEFAZOLIN SODIUM-DEXTROSE 1-4 GM/50ML-% IV SOLN
INTRAVENOUS | Status: AC
  Filled 2024-05-22: qty 50

## 2024-05-22 MED ORDER — PATIROMER SORBITEX CALCIUM 8.4 G PO PACK
16.8000 g | PACK | Freq: Every day | ORAL | Status: DC
  Administered 2024-05-23 – 2024-05-25 (×3): 16.8 g via ORAL
  Filled 2024-05-22 (×5): qty 2

## 2024-05-22 MED ORDER — LIDOCAINE-EPINEPHRINE (PF) 1 %-1:200000 IJ SOLN
INTRAMUSCULAR | Status: DC | PRN
  Administered 2024-05-22: 20 mL

## 2024-05-22 MED ORDER — MELATONIN 5 MG PO TABS
10.0000 mg | ORAL_TABLET | Freq: Every day | ORAL | Status: DC
Start: 2024-05-22 — End: 2024-05-28
  Administered 2024-05-22 – 2024-05-27 (×4): 10 mg via ORAL
  Filled 2024-05-22 (×5): qty 2

## 2024-05-22 MED ORDER — INSULIN ASPART 100 UNIT/ML IJ SOLN
0.0000 [IU] | INTRAMUSCULAR | Status: DC
  Administered 2024-05-22 (×2): 2 [IU] via SUBCUTANEOUS
  Administered 2024-05-22: 1 [IU] via SUBCUTANEOUS
  Administered 2024-05-22 – 2024-05-24 (×3): 2 [IU] via SUBCUTANEOUS
  Administered 2024-05-24 (×2): 1 [IU] via SUBCUTANEOUS
  Administered 2024-05-24 – 2024-05-25 (×2): 2 [IU] via SUBCUTANEOUS
  Administered 2024-05-25: 3 [IU] via SUBCUTANEOUS
  Administered 2024-05-25 – 2024-05-26 (×3): 2 [IU] via SUBCUTANEOUS
  Administered 2024-05-26 – 2024-05-27 (×2): 3 [IU] via SUBCUTANEOUS
  Administered 2024-05-27: 2 [IU] via SUBCUTANEOUS
  Administered 2024-05-27 (×2): 3 [IU] via SUBCUTANEOUS
  Administered 2024-05-28: 2 [IU] via SUBCUTANEOUS
  Filled 2024-05-22 (×22): qty 1

## 2024-05-22 MED ORDER — METOPROLOL SUCCINATE ER 25 MG PO TB24
25.0000 mg | ORAL_TABLET | Freq: Every day | ORAL | Status: DC
  Administered 2024-05-22 – 2024-05-23 (×2): 25 mg via ORAL
  Filled 2024-05-22 (×3): qty 1

## 2024-05-22 MED ORDER — FENTANYL CITRATE (PF) 100 MCG/2ML IJ SOLN
INTRAMUSCULAR | Status: AC
Start: 2024-05-22 — End: 2024-05-22
  Filled 2024-05-22: qty 2

## 2024-05-22 MED ORDER — FUROSEMIDE 10 MG/ML IJ SOLN
40.0000 mg | Freq: Two times a day (BID) | INTRAMUSCULAR | Status: DC
  Filled 2024-05-22: qty 4

## 2024-05-22 MED ORDER — OXYCODONE HCL 5 MG PO TABS
5.0000 mg | ORAL_TABLET | Freq: Four times a day (QID) | ORAL | Status: DC | PRN
  Administered 2024-05-22 – 2024-05-24 (×7): 10 mg via ORAL
  Administered 2024-05-25: 5 mg via ORAL
  Administered 2024-05-26 – 2024-05-28 (×4): 10 mg via ORAL
  Filled 2024-05-22 (×11): qty 2

## 2024-05-22 MED ORDER — LINACLOTIDE 145 MCG PO CAPS
145.0000 ug | ORAL_CAPSULE | Freq: Every day | ORAL | Status: DC
  Administered 2024-05-22 – 2024-05-28 (×5): 145 ug via ORAL
  Filled 2024-05-22 (×8): qty 1

## 2024-05-22 MED ORDER — SODIUM CHLORIDE 0.9 % IV SOLN: INTRAVENOUS | Status: DC

## 2024-05-22 MED ORDER — FUROSEMIDE 10 MG/ML IJ SOLN: 120.0000 mg | Freq: Two times a day (BID) | INTRAVENOUS | Status: DC

## 2024-05-22 MED ORDER — SENNOSIDES-DOCUSATE SODIUM 8.6-50 MG PO TABS
1.0000 | ORAL_TABLET | Freq: Two times a day (BID) | ORAL | Status: DC
  Administered 2024-05-22 – 2024-05-28 (×4): 1 via ORAL
  Filled 2024-05-22 (×5): qty 1

## 2024-05-22 MED ORDER — IPRATROPIUM-ALBUTEROL 0.5-2.5 (3) MG/3ML IN SOLN
6.0000 mL | Freq: Once | RESPIRATORY_TRACT | Status: AC
  Administered 2024-05-22: 6 mL via RESPIRATORY_TRACT
  Filled 2024-05-22: qty 6

## 2024-05-22 MED ORDER — CHLORHEXIDINE GLUCONATE CLOTH 2 % EX PADS
6.0000 | MEDICATED_PAD | Freq: Every day | CUTANEOUS | Status: DC
  Administered 2024-05-23 – 2024-05-27 (×5): 6 via TOPICAL

## 2024-05-22 MED ORDER — LINAGLIPTIN 5 MG PO TABS
5.0000 mg | ORAL_TABLET | Freq: Every day | ORAL | Status: DC
  Administered 2024-05-22 – 2024-05-28 (×6): 5 mg via ORAL
  Filled 2024-05-22 (×7): qty 1

## 2024-05-22 MED ORDER — ONDANSETRON HCL 4 MG PO TABS
4.0000 mg | ORAL_TABLET | Freq: Four times a day (QID) | ORAL | Status: DC | PRN
  Administered 2024-05-22 – 2024-05-28 (×6): 4 mg via ORAL
  Filled 2024-05-22 (×6): qty 1

## 2024-05-22 MED ORDER — MIDAZOLAM HCL 2 MG/2ML IJ SOLN
INTRAMUSCULAR | Status: DC | PRN
  Administered 2024-05-22 (×4): .5 mg via INTRAVENOUS

## 2024-05-22 MED ORDER — FUROSEMIDE 10 MG/ML IJ SOLN
80.0000 mg | Freq: Two times a day (BID) | INTRAMUSCULAR | Status: DC
  Administered 2024-05-22 – 2024-05-28 (×13): 80 mg via INTRAVENOUS
  Filled 2024-05-22 (×11): qty 8

## 2024-05-22 MED ORDER — HEPARIN SODIUM (PORCINE) 5000 UNIT/ML IJ SOLN
5000.0000 [IU] | Freq: Three times a day (TID) | INTRAMUSCULAR | Status: DC
  Administered 2024-05-22 – 2024-05-28 (×18): 5000 [IU] via SUBCUTANEOUS
  Filled 2024-05-22 (×18): qty 1

## 2024-05-22 MED ORDER — BISACODYL 5 MG PO TBEC
10.0000 mg | DELAYED_RELEASE_TABLET | Freq: Every day | ORAL | Status: DC
  Administered 2024-05-27: 10 mg via ORAL
  Filled 2024-05-22 (×2): qty 2

## 2024-05-22 MED ORDER — ONDANSETRON HCL 4 MG/2ML IJ SOLN
4.0000 mg | Freq: Four times a day (QID) | INTRAMUSCULAR | Status: DC | PRN
  Administered 2024-05-26 – 2024-05-27 (×2): 4 mg via INTRAVENOUS
  Filled 2024-05-22 (×3): qty 2

## 2024-05-22 MED ORDER — BISACODYL 10 MG RE SUPP
10.0000 mg | Freq: Every day | RECTAL | Status: DC | PRN
  Administered 2024-05-26: 10 mg via RECTAL
  Filled 2024-05-22: qty 1

## 2024-05-22 MED ORDER — METHYLPREDNISOLONE SODIUM SUCC 125 MG IJ SOLR: 125.0000 mg | Freq: Once | INTRAMUSCULAR | Status: DC | PRN

## 2024-05-22 MED ORDER — FAMOTIDINE 20 MG PO TABS: 40.0000 mg | ORAL_TABLET | Freq: Once | ORAL | Status: DC | PRN

## 2024-05-22 MED ORDER — CEFAZOLIN SODIUM-DEXTROSE 1-4 GM/50ML-% IV SOLN: 1.0000 g | INTRAVENOUS | Status: DC

## 2024-05-22 MED ORDER — HEPARIN SODIUM (PORCINE) 10000 UNIT/ML IJ SOLN
INTRAMUSCULAR | Status: DC | PRN
  Administered 2024-05-22: 10000 [IU]

## 2024-05-22 MED ORDER — LACTULOSE 10 GM/15ML PO SOLN
20.0000 g | Freq: Two times a day (BID) | ORAL | Status: DC
  Filled 2024-05-22 (×5): qty 30

## 2024-05-22 MED ORDER — MIDAZOLAM HCL 2 MG/2ML IJ SOLN
INTRAMUSCULAR | Status: AC
  Filled 2024-05-22: qty 2

## 2024-05-22 MED ORDER — FENTANYL CITRATE (PF) 100 MCG/2ML IJ SOLN
INTRAMUSCULAR | Status: DC | PRN
  Administered 2024-05-22 (×4): 25 ug via INTRAVENOUS

## 2024-05-22 MED ORDER — FUROSEMIDE 10 MG/ML IJ SOLN
80.0000 mg | Freq: Two times a day (BID) | INTRAMUSCULAR | Status: DC
  Filled 2024-05-22: qty 8

## 2024-05-22 MED ORDER — HEPARIN (PORCINE) IN NACL 1000-0.9 UT/500ML-% IV SOLN
INTRAVENOUS | Status: DC | PRN
Start: 2024-05-22 — End: 2024-05-22
  Administered 2024-05-22: 500 mL

## 2024-05-22 MED ORDER — FAMOTIDINE 20 MG PO TABS
10.0000 mg | ORAL_TABLET | Freq: Every day | ORAL | Status: DC
  Administered 2024-05-22 – 2024-05-28 (×7): 10 mg via ORAL
  Filled 2024-05-22 (×7): qty 1

## 2024-05-22 MED ORDER — FUROSEMIDE 10 MG/ML IJ SOLN
40.0000 mg | Freq: Two times a day (BID) | INTRAMUSCULAR | Status: DC
  Administered 2024-05-22 – 2024-05-28 (×13): 40 mg via INTRAVENOUS
  Filled 2024-05-22 (×13): qty 4

## 2024-05-22 MED ORDER — MIDAZOLAM HCL 2 MG/ML PO SYRP: 8.0000 mg | ORAL_SOLUTION | Freq: Once | ORAL | Status: DC | PRN

## 2024-05-22 MED ORDER — POLYETHYLENE GLYCOL 3350 17 G PO PACK
17.0000 g | PACK | Freq: Two times a day (BID) | ORAL | Status: DC
  Administered 2024-05-27 – 2024-05-28 (×2): 17 g via ORAL
  Filled 2024-05-22 (×5): qty 1

## 2024-05-22 SURGICAL SUPPLY — 12 items
BIOPATCH RED 1 DISK 7.0 (GAUZE/BANDAGES/DRESSINGS) IMPLANT
CATH CANNON HEMO 15FR 19 (HEMODIALYSIS SUPPLIES) IMPLANT
COVER PROBE ULTRASOUND 5X96 (MISCELLANEOUS) IMPLANT
DERMABOND ADVANCED .7 DNX12 (GAUZE/BANDAGES/DRESSINGS) IMPLANT
DRAPE INCISE IOBAN 66X45 STRL (DRAPES) IMPLANT
GOWN STRL REUS W/ TWL LRG LVL3 (GOWN DISPOSABLE) ×1 IMPLANT
NDL ENTRY 21GA 7CM ECHOTIP (NEEDLE) IMPLANT
NEEDLE ENTRY 21GA 7CM ECHOTIP (NEEDLE) ×1 IMPLANT
PACK ANGIOGRAPHY (CUSTOM PROCEDURE TRAY) ×1 IMPLANT
SET INTRO CAPELLA COAXIAL (SET/KITS/TRAYS/PACK) IMPLANT
SUT MNCRL AB 4-0 PS2 18 (SUTURE) IMPLANT
SUT SILK 0 FSL (SUTURE) IMPLANT

## 2024-05-22 NOTE — H&P (Addendum)
 History and Physical    Patient: Eugene [name] FMW:[id number] DOB: 1961-03-14 DOA: 05/21/2024 DOS: the patient was seen and examined on 05/22/2024 PCP: [name]  Patient coming from: [name]  Chief Complaint: Swelling, shortness of breath      HPI:  63 y.o. M with hx CKD V baseline Cr 4.7 not yet on HD, MO, DM, bilateral BKA, hx hep C, remote history epidural abscess/vertebral osteo, now with neurogenic bladder s/p SP catheter, recent ileus, last admitted 8/7 to 8/23 for facial swelling and respiratory failure, diuresed and Nephrology consulted, treated medically.  Discharged 4 days ago, on new O2.  Since discharge, has had dyspnea, waxing and waning, worse with lying flat.  Today, after eating, his breathing got much worse, he needed more oxygen to maintain O2 sats and they told him he needed to come to the hospital for HD.    In the ER, K 5.3, BUN 49, Cr 4.9 stable from last discharge.  However, short of breath, wheezing and requiring back up to 9L HFNC.   BNP 450, WBC normal, Hgb 8.7, K 5.3, Cr 4.9 lact normal, Cr 24 flat CXR old left effusion ECG ST      Review of Systems  Constitutional:  Negative for chills, fever and malaise/fatigue.  Eyes:  Negative for blurred vision.  Respiratory:  Positive for cough and shortness of breath. Negative for hemoptysis, sputum production and wheezing.   Cardiovascular:  Positive for orthopnea, leg swelling and PND. Negative for chest pain.  Skin:  Negative for rash.  All other systems reviewed and are negative.    Past Medical History:  Diagnosis Date   Anxiety    Depression    Diabetes mellitus without complication (HCC)    Hepatitis C    Hyperlipidemia    IBS (irritable bowel syndrome)    Osteomyelitis (HCC)    Spinal stenosis    Past Surgical History:  Procedure Laterality Date   BACK SURGERY     bilateral amputation Bilateral    CENTRAL LINE INSERTION-TUNNELED N/A 02/02/2019   Procedure:  CENTRAL LINE INSERTION-TUNNELED;  Surgeon: Marea Selinda RAMAN, MD;  Location: [facility] INVASIVE CV [facility];  Service: Cardiovascular;  Laterality: N/A;   COLONOSCOPY WITH PROPOFOL  N/A 01/12/2020   Procedure: COLONOSCOPY WITH PROPOFOL ;  Surgeon: Jinny Carmine, MD;  Location: [facility] ENDOSCOPY;  Service: Endoscopy;  Laterality: N/A;   ESOPHAGOGASTRODUODENOSCOPY (EGD) WITH PROPOFOL  N/A 12/07/2019   Procedure: ESOPHAGOGASTRODUODENOSCOPY (EGD) WITH PROPOFOL ;  Surgeon: Unk Corinn Skiff, MD;  Location: [facility] ENDOSCOPY;  Service: Gastroenterology;  Laterality: N/A;   IR CATHETER TUBE CHANGE  12/04/2019   SPINAL FUSION     THORACIC SPINE SURGERY  01/2019   extensive washout   Social History:  reports that he has been smoking cigarettes. He has never used smokeless tobacco. He reports that he does not currently use alcohol. He reports that he does not currently use drugs.  No Known Allergies  Family History  Problem Relation Age of Onset   Kidney failure Father    Kidney failure Brother     Prior to Admission medications   Medication Sig Start Date End Date Taking? Authorizing Provider  acetaminophen  (TYLENOL ) 325 MG tablet Take 650 mg by mouth in the morning and at bedtime.    [provider]  bisacodyl  (DULCOLAX) 10 MG suppository Place 10 mg rectally daily as needed for moderate constipation.    [provider]  bisacodyl  (DULCOLAX) 5 MG EC tablet Take 2 tablets (10 mg total) by mouth  at bedtime. 05/16/24   Fausto Burnard LABOR, DO  cyanocobalamin  1000 MCG tablet Take 1 tablet (1,000 mcg total) by mouth daily. 05/17/24   Fausto Burnard A, DO  Doxepin  HCl 6 MG TABS Take 6 mg by mouth at bedtime.    [provider]  famotidine  (PEPCID ) 10 MG tablet Take 1 tablet (10 mg total) by mouth daily. 05/17/24   Fausto Burnard LABOR, DO  feeding supplement (ENSURE PLUS HIGH PROTEIN) LIQD Take 237 mLs by mouth 2 (two) times daily between meals. 05/16/24   Fausto Burnard LABOR, DO  folic acid  (FOLVITE ) 1 MG  tablet Take 1 tablet (1 mg total) by mouth daily. 05/17/24   Fausto Burnard A, DO  furosemide  (LASIX ) 40 MG tablet Take 1 tablet (40 mg total) by mouth daily. 04/25/21   Patel, Sona, MD  gabapentin  (NEURONTIN ) 300 MG capsule Take 300 mg by mouth at bedtime.    [provider]  hydrocortisone  cream 1 % Apply 1 Application topically daily.    [provider]  insulin  glargine-yfgn (SEMGLEE ) 100 UNIT/ML injection Inject 0.1 mLs (10 Units total) into the skin 2 (two) times daily. 05/01/24   Jhonny Calvin NOVAK, MD  insulin  lispro (HUMALOG ) 100 UNIT/ML injection Inject 0-15 Units into the skin 4 (four) times daily -  before meals and at bedtime. <150= 0 units 151-200= 3 units 201-250= 6 units 251-300= 9 units 301-350= 12 units 351-400= 15 units >400 call MD Patient taking differently: Inject 0-15 Units into the skin 4 (four) times daily -  before meals and at bedtime. <150= 0 units 151-200= 3 units 201-250= 6 units 251-300= 9 units 301-350= 12 units 351-400= 15 units >400 call MD    [provider]  lactulose  (CEPHULAC ) 20 g packet Take 20 g by mouth 2 (two) times daily.    [provider]  levothyroxine  (SYNTHROID ) 175 MCG tablet Take 175 mcg by mouth at bedtime.    [provider]  linaclotide  (LINZESS ) 145 MCG CAPS capsule Take 1 capsule (145 mcg total) by mouth daily before breakfast. 10/25/19   Jonavan Vanhorn, Lonni SQUIBB, MD  Melatonin 5 MG TABS Take 10 mg by mouth at bedtime.    [provider]  metoCLOPramide  (REGLAN ) 10 MG tablet Take 0.5 tablets (5 mg total) by mouth 3 (three) times daily before meals. 05/01/24   Jhonny Calvin NOVAK, MD  metoprolol  succinate (TOPROL -XL) 25 MG 24 hr tablet Take 25 mg by mouth daily.    [provider]  ondansetron  (ZOFRAN ) 4 MG tablet Take 4 mg by mouth daily.    [provider]  oxyCODONE  10 MG TABS Take 0.5-1 tablets (5-10 mg total) by mouth every 6 (six) hours as needed for moderate pain  (pain score 4-6) or severe pain (pain score 7-10). 05/16/24   Fausto Burnard A, DO  patiromer  (VELTASSA ) 8.4 g packet Take 2 packets (16.8 g total) by mouth daily. 05/16/24   Fausto Burnard LABOR, DO  polyethylene glycol (MIRALAX  / GLYCOLAX ) 17 g packet Take 17 g by mouth 2 (two) times daily. 10/24/19   Glenice Ciccone, Lonni SQUIBB, MD  senna-docusate (SENOKOT-S) 8.6-50 MG tablet Take 1 tablet by mouth 2 (two) times daily. 10/24/19   Daryl Beehler, Lonni SQUIBB, MD  simethicone  (MYLICON) 80 MG chewable tablet Chew 1 tablet (80 mg total) by mouth 4 (four) times daily. 05/01/24   Sreenath, Sudheer B, MD  sitaGLIPtin (JANUVIA) 100 MG tablet Take 100 mg by mouth daily.    [provider]  sodium phosphate   Pediatric (FLEET) 3.5-9.5 GM/59ML enema Place 1 enema rectally every other day as needed for severe constipation.    [provider]  Vitamin D , Ergocalciferol , (DRISDOL ) 1.25 MG (50000 UNIT) CAPS capsule Take 1 capsule (50,000 Units total) by mouth every 7 (seven) days. 05/18/24   Fausto Burnard LABOR, DO  pregabalin  (LYRICA ) 75 MG capsule Take 75 mg by mouth 2 (two) times daily. Patient not taking: Reported on 04/17/2021  04/20/21  [provider]    Physical Exam: Vitals:   05/21/24 2325 05/22/24 0004 05/22/24 0055 05/22/24 0214  BP:   108/73 (!) 155/99  Pulse:   (!) 104   Resp:   (!) 24 20  Temp:   99.1 F (37.3 C) 98.7 F (37.1 C)  TempSrc:   Oral Oral  SpO2: 94% 94% 96% 92%  Weight:       Obese adult male, lying in bed, interactive and appropriate, on high flow nasal cannula, appears tachypneic Oropharynx tacky dry, dentition in good repair, lips normal Anicteric, conjunctival pink, lids and lashes normal Tachycardic, regular, no murmurs, 2+ pitting edema of both legs stumps, arms, and trunk Respiratory rate is increased, lung sounds are diminished bilaterally, no wheezing or rales Abdomen soft, no tenderness palpation, no guarding Attention normal, oriented x 3, face symmetric,  speech fluent, upper extremity strength normal    Data Reviewed: Basic metabolic panel shows mild hyperkalemia and uremia and, creatinine 4.9, albumin  1.8 BNP elevated Troponin low and flat Lactic acid normal CBC shows no leukocytosis, mild anemia Chest x-ray, personally reviewed, shows stable chronic left effusion EKG, personally reviewed, shows no ST changes, sinus rhythm Communicated with EDP, vascular surgery, and nephrology    Assessment and Plan: * ESRD (end stage renal disease) (HCC) Cr has been in the 4s for some time.  I think the hope was to discharge with oral diuretics, but he returns with fluid overload.  K and BUN normal.   - Start IV Lasix  120 mg IV  - Consult Nephrology - Consult Vascular for HD catheter - Strict I/Os    Class 3 obesity BMI 57, complicates care  Acute on chronic diastolic CHF (congestive heart failure) (HCC) Due to fluid overload from renal failure.  Echo in July showed EF 55-60% - IV Lasix   Hypothyroidism - Continue LT4 - Check TSH  Essential hypertension BP elevated - IV Lasix  - Continue metoprolol   Type 2 diabetes mellitus with peripheral neuropathy (HCC) Glucose normal - Hold glargine until diet started - SS corrections - Continue Januvia  Acute respiratory failure with hypoxia (HCC) At baseline the patient is not on O2.  Since last admission, has been on 2L due to persistent fluid overload and left pleural effusion.  Now requiring 9L and symptomatically dyspnea and tachypneic. - Stop steroids, denies history of asthma, COPD, or emphysema  S/P bilateral BKA (below knee amputation) (HCC)    Suprapubic catheter (HCC)    Normocytic anemia Due to renal failure.  Recent feritin and iron  panel normal  Chronic hepatitis C without hepatic coma (HCC)    Chronic pain syndrome - Continue home oxycodone          Advance Care Planning: DNR  Consults: Communicated with Dr. Dennise, nephrology Secure chat sent to vascular  surgery, Dr. Jama to request HD catheter placement  Family Communication: None present  Severity of Illness: The appropriate patient status for this patient is INPATIENT. Inpatient status is judged to be reasonable and necessary in order to provide the required intensity of service  to ensure the patient's safety. The patient's presenting symptoms, physical exam findings, and initial radiographic and laboratory data in the context of their chronic comorbidities is felt to place them at high risk for further clinical deterioration. Furthermore, it is not anticipated that the patient will be medically stable for discharge from the hospital within 2 midnights of admission.   * I certify that at the point of admission it is my clinical judgment that the patient will require inpatient hospital care spanning beyond 2 midnights from the point of admission due to high intensity of service, high risk for further deterioration and high frequency of surveillance required.*  Author: Lonni SHAUNNA Dalton, MD 05/22/2024 3:26 AM  For on call review [email or web address].

## 2024-05-22 NOTE — Assessment & Plan Note (Addendum)
-   IV Lasix  - Continue metoprolol

## 2024-05-22 NOTE — Assessment & Plan Note (Addendum)
 -  Continue home oxycodone

## 2024-05-22 NOTE — Assessment & Plan Note (Addendum)
 BMI 55.2, complicates care

## 2024-05-22 NOTE — Assessment & Plan Note (Addendum)
 Follow up as outpatient

## 2024-05-22 NOTE — Progress Notes (Signed)
 [facility]  ROUNDING NOTE   Subjective:   Eugene [name]  is a 63 y.o.  male  with past medical conditions including diabetes, bilateral BKA, suprapubic catheter, and hepatitis C. Patient presents to ED from SNF with shortness of breath and has been admitted for Bronchospasm [J98.01] Hypoxia [R09.02] ESRD (end stage renal disease) (HCC) [N18.6]  Patient is known to our practice from recent admission. HE states he has been experiencing shortness of breath for the past couple days. Is a resident of local nursing facility. Unsure of sick contacts, states he stays in his room mostly.   Labs on ED arrival concerning fr potassium 5.3, BUN 49, creatinine 4.91 with GFR 13, BNP 456 and troponin 24. Hgb 8.7.UA appears hazy with proteins. Chest xray stable.  We have been consulted to evaluate acute kidney injury and need for renal replacement therapy.    08/28 0701 - 08/29 0700 In: -  Out: 900 [Urine:900]  Lab Results  Component Value Date   CREATININE 4.91 (H) 05/21/2024   CREATININE 4.62 (H) 05/16/2024   CREATININE 4.67 (H) 05/15/2024     Objective:  Vital signs in last 24 hours:  Temp:  [98.4 F (36.9 C)-99.5 F (37.5 C)] 98.4 F (36.9 C) (08/29 0352) Pulse Rate:  [101-107] 107 (08/29 0352) Resp:  [20-25] 21 (08/29 0352) BP: (108-196)/(73-99) 159/95 (08/29 0352) SpO2:  [92 %-98 %] 95 % (08/29 0827) Weight:  [133.4 kg] 133.4 kg (08/28 2216)  Weight change:  Filed Weights   05/21/24 2216  Weight: 133.4 kg    Intake/Output: I/O last 3 completed shifts: In: -  Out: 900 [Urine:900]   Intake/Output this shift:  No intake/output data recorded.  Physical Exam: General: NAD, laying in bed  Head: Normocephalic  Eyes: Anicteric  Lungs:  Diminished, 7L HFNC  Heart: Regular rate   Abdomen:  Distended and firm, obese  Extremities: 1-2+ peripheral edema.,  Bilateral BKA  Neurologic: Awake and alert  Skin: No rashes  Access: None  Suprapubic catheter in  place  Basic Metabolic Panel: Recent Labs  Lab 05/16/24 1039 05/21/24 2323  NA 139 139  K 4.7 5.3*  CL 106 106  CO2 24 23  GLUCOSE 149* 166*  BUN 61* 49*  CREATININE 4.62* 4.91*  CALCIUM  7.2* 7.4*  PHOS 4.9*  --     Liver Function Tests: Recent Labs  Lab 05/16/24 1039 05/21/24 2323  AST  --  16  ALT  --  14  ALKPHOS  --  49  BILITOT  --  0.4  PROT  --  5.6*  ALBUMIN  1.7* 1.8*   No results for input(s): LIPASE, AMYLASE in the last 168 hours.  No results for input(s): AMMONIA in the last 168 hours.  CBC: Recent Labs  Lab 05/21/24 2323  WBC 6.0  NEUTROABS 3.8  HGB 8.7*  HCT 27.9*  MCV 94.9  PLT 218    Cardiac Enzymes: No results for input(s): CKTOTAL, CKMB, CKMBINDEX, TROPONINI in the last 168 hours.  BNP: Invalid input(s): POCBNP  CBG: Recent Labs  Lab 05/16/24 2009 05/17/24 0818 05/17/24 1222 05/17/24 1629 05/18/24 0746  GLUCAP 115* 136* 141* 121* 109*    Microbiology: Results for orders placed or performed during the hospital encounter of 05/21/24  Blood culture (routine x 2)     Status: None (Preliminary result)   Collection Time: 05/21/24 11:23 PM   Specimen: BLOOD  Result Value Ref Range Status   Specimen Description BLOOD RIGHT ANTECUBITAL  Final  Special Requests   Final    BOTTLES DRAWN AEROBIC AND ANAEROBIC Blood Culture adequate volume   Culture   Final    NO GROWTH < 12 HOURS Performed at [facility], [address] [place], [place] [address]    Report Status PENDING  Incomplete  Blood culture (routine x 2)     Status: None (Preliminary result)   Collection Time: 05/21/24 11:23 PM   Specimen: BLOOD  Result Value Ref Range Status   Specimen Description BLOOD LEFT ANTECUBITAL  Final   Special Requests   Final    BOTTLES DRAWN AEROBIC AND ANAEROBIC Blood Culture results may not be optimal due to an inadequate volume of blood received in culture bottles   Culture   Final    NO GROWTH < 12  HOURS Performed at [facility], [address] [place], [place] [address]    Report Status PENDING  Incomplete    Coagulation Studies: No results for input(s): LABPROT, INR in the last 72 hours.  Urinalysis: Recent Labs    05/21/24 2317  COLORURINE YELLOW*  LABSPEC 1.017  PHURINE 6.0  GLUCOSEU 150*  HGBUR SMALL*  BILIRUBINUR NEGATIVE  KETONESUR NEGATIVE  PROTEINUR >=300*  NITRITE NEGATIVE  LEUKOCYTESUR SMALL*       Imaging: DG Chest Portable 1 View Result Date: 05/21/2024 CLINICAL DATA:  Respiratory distress EXAM: PORTABLE CHEST 1 VIEW COMPARISON:  05/12/2024 FINDINGS: Cardiac shadow is enlarged. Postsurgical changes are again noted in the thoracolumbar spine. Stable apparent loculated left effusion is noted. Small right effusion is noted as well. Mild bibasilar atelectasis is seen similar to that noted on the prior exam. No findings of fluid overload are seen. IMPRESSION: Overall stable appearance of the chest when compare with the prior exam. Electronically Signed   By: Oneil Devonshire M.D.   On: 05/21/2024 23:01      Medications:        bisacodyl   10 mg Oral QHS   famotidine   10 mg Oral Daily   furosemide   80 mg Intravenous BID   And   furosemide   40 mg Intravenous BID   heparin   5,000 Units Subcutaneous Q8H   insulin  aspart  0-9 Units Subcutaneous Q4H   lactulose   20 g Oral BID   linaclotide   145 mcg Oral QAC breakfast   linagliptin   5 mg Oral Daily   melatonin  10 mg Oral QHS   metoprolol  succinate  25 mg Oral Daily   patiromer   16.8 g Oral Daily   polyethylene glycol  17 g Oral BID   senna-docusate  1 tablet Oral BID   acetaminophen  **OR** acetaminophen , bisacodyl , ondansetron  **OR** ondansetron  (ZOFRAN ) IV, oxyCODONE   Assessment/ Plan:  [name] is a 63 y.o.  male  with past medical conditions including diabetes, bilateral BKA, suprapubic catheter, hepatitis C, who was admitted to [facility] on 05/21/2024 for Bronchospasm  [J98.01] Hypoxia [R09.02] ESRD (end stage renal disease) (HCC) [N18.6]    Acute kidney injury with proteinuria and hyperkalemia. On chronic kidney disease stage IV, creatinine 3.74, GFR of 18 on 04/30/24. Acute kidney injury likely secondary to progression of kidney disease and volume overload. Patient has had multiple recent admissions where he has been remained borderline but stable with renal function. It has been discussed with patient during recent admissions that he will likely require dialysis in the near future. Patient understands that due to current symptoms and lack of effective response to medications, we feel it is time to pursue dialysis. Patient  is agreeable to proceed. Vascular surgery has been consulted to place permcath. Will initiate dialysis today or tomorrow depending on timing of permcath placement. Will also seek outpatient dialysis clinic chair during this admission.   Lab Results  Component Value Date   CREATININE 4.91 (H) 05/21/2024   CREATININE 4.62 (H) 05/16/2024   CREATININE 4.67 (H) 05/15/2024     Intake/Output Summary (Last 24 hours) at 05/22/2024 0903 Last data filed at 05/22/2024 0600 Gross per 24 hour  Intake --  Output 900 ml  Net -900 ml    2. Acute respiratory failure requiring increased oxygen. Currently on 7L HFNC.  Chest xray negative for edema.     3.  Hypertension with chronic kidney disease.  Currently receiving IV furosemide  120mg  twice daily and metoprolol .    4.  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 Urine protein to creatinine ratio of 15.4 from previous admission.  Proteinuria likely secondary to diabetic nephropathy.  Hemoglobin A1c of 6.0% from 04/14/2024.    5. Anemia of chronic kidney disease Lab Results  Component Value Date   HGB 8.7 (L) 05/21/2024    Hemoglobin 8.7.  Previous iron  studies acceptable. Will likely require ESA, will monitor.     LOS: 0 Ithan Touhey 8/29/20259:03 AM

## 2024-05-22 NOTE — Assessment & Plan Note (Addendum)
 Last A1c 6.0, on Tradjenta 

## 2024-05-22 NOTE — Assessment & Plan Note (Addendum)
 Continue levothyroxine 

## 2024-05-22 NOTE — Progress Notes (Signed)
 No charge progress note.  63 y.o. M with hx CKD V baseline Cr 4.7 not yet on HD, MO, DM, bilateral BKA, hx hep C, remote history epidural abscess/vertebral osteo, now with neurogenic bladder s/p SP catheter, recent ileus, last admitted 8/7 to 8/23 for facial swelling and respiratory failure, diuresed and Nephrology consulted, treated medically.  Discharged 4 days ago, on new O2.  Since discharge, has had dyspnea, waxing and waning, worse with lying flat.  Today, after eating, his breathing got much worse, he needed more oxygen to maintain O2 sats and they told him he needed to come to the hospital for HD.    In the ER, K 5.3, BUN 49, Cr 4.9 stable from last discharge.  However, short of breath, wheezing and requiring back up to 9L HFNC.   BNP 450, WBC normal, Hgb 8.7, K 5.3, Cr 4.9 lact normal, Cr 24  Troponin with a flat curve, CXR old left effusion   8/29: Vital stable, on 7 L of oxygen.  Nephrology was consulted and HD catheter placement was ordered, patient will be started on dialysis afterwards.  Patient was admitted earlier in the day.  Still needing up to 7 L of oxygen when seen today.  On exam a obese gentleman with bilateral BKA, no crackles.  Going for HD catheter placement with vascular followed by dialysis today.

## 2024-05-22 NOTE — Consult Note (Signed)
 Hospital Consult    Reason for Consult:  Placement of Dialysis Perma Catheter Requesting Physician:  Dr Lonni Dalton MD  MRN #:  [id number]  History of Present Illness: This is a 63 y.o. male with hx CKD V baseline Cr 4.7 not yet on HD, MO, DM, bilateral BKA, hx hep C, remote history epidural abscess/vertebral osteo, now with neurogenic bladder s/p SP catheter, recent ileus, last admitted 8/7 to 8/23 for facial swelling and respiratory failure, diuresed and Nephrology consulted, treated medically. Discharged 4 days ago, on new O2.  Since discharge, has had dyspnea, waxing and waning, worse with lying flat.  Today, after eating, his breathing got much worse, he needed more oxygen to maintain O2 sats and they told him he needed to come to the hospital for HD.     In the ER, K 5.3, BUN 49, Cr 4.9 stable from last discharge.  However, short of breath, wheezing and requiring back up to 9L HFNC. Vascular Surgery consulted for dialysis access placement.   Past Medical History:  Diagnosis Date   Anxiety    Depression    Diabetes mellitus without complication (HCC)    Hepatitis C    Hyperlipidemia    IBS (irritable bowel syndrome)    Osteomyelitis (HCC)    Spinal stenosis     Past Surgical History:  Procedure Laterality Date   BACK SURGERY     bilateral amputation Bilateral    CENTRAL LINE INSERTION-TUNNELED N/A 02/02/2019   Procedure: CENTRAL LINE INSERTION-TUNNELED;  Surgeon: Marea Selinda RAMAN, MD;  Location: [facility];  Service: Cardiovascular;  Laterality: N/A;   COLONOSCOPY WITH PROPOFOL  N/A 01/12/2020   Procedure: COLONOSCOPY WITH PROPOFOL ;  Surgeon: Jinny Carmine, MD;  Location: [facility] ENDOS[facility];  Service: Endoscopy;  Laterality: N/A;   ESOPHAGOGASTRODUODENOSCOPY (EGD) WITH PROPOFOL  N/A 12/07/2019   Procedure: ESOPHAGOGASTRODUODENOSCOPY (EGD) WITH PROPOFOL ;  Surgeon: Unk Corinn Skiff, MD;  Location: [facility] ENDOSCOPY;  Service: Gastroenterology;  Laterality: N/A;   IR CATHETER  TUBE CHANGE  12/04/2019   SPINAL FUSION     THORACIC SPINE SURGERY  01/2019   extensive washout    No Known Allergies  Prior to Admission medications   Medication Sig Start Date End Date Taking? Authorizing Provider  acetaminophen  (TYLENOL ) 325 MG tablet Take 650 mg by mouth in the morning and at bedtime.   Yes [provider]  bisacodyl  (DULCOLAX) 10 MG suppository Place 10 mg rectally daily as needed for moderate constipation.   Yes [provider]  bisacodyl  (DULCOLAX) 5 MG EC tablet Take 2 tablets (10 mg total) by mouth at bedtime. 05/16/24  Yes Fausto Sor A, DO  cyanocobalamin  1000 MCG tablet Take 1 tablet (1,000 mcg total) by mouth daily. 05/17/24  Yes Fausto Sor A, DO  Doxepin  HCl 6 MG TABS Take 6 mg by mouth at bedtime.   Yes [provider]  famotidine  (PEPCID ) 10 MG tablet Take 1 tablet (10 mg total) by mouth daily. 05/17/24  Yes Fausto Sor A, DO  folic acid  (FOLVITE ) 1 MG tablet Take 1 tablet (1 mg total) by mouth daily. 05/17/24  Yes Fausto Sor A, DO  gabapentin  (NEURONTIN ) 300 MG capsule Take 300 mg by mouth at bedtime.   Yes [provider]  insulin  glargine-yfgn (SEMGLEE ) 100 UNIT/ML injection Inject 0.1 mLs (10 Units total) into the skin 2 (two) times daily. 05/01/24  Yes Sreenath, Sudheer B, MD  insulin  lispro (HUMALOG ) 100 UNIT/ML injection Inject 0-15 Units into the skin 4 (four) times daily -  before meals and at bedtime. <150= 0 units 151-200= 3 units 201-250= 6 units 251-300= 9 units 301-350= 12 units 351-400= 15 units >400 call MD Patient taking differently: Inject 0-15 Units into the skin 4 (four) times daily -  before meals and at bedtime. <150= 0 units 151-200= 3 units 201-250= 6 units 251-300= 9 units 301-350= 12 units 351-400= 15 units >400 call MD   Yes [provider]  Lactulose  20 GM/30ML SOLN Take 30 mLs by mouth 2 (two) times daily.   Yes [provider]  levothyroxine  (SYNTHROID ) 175  MCG tablet Take 175 mcg by mouth at bedtime.   Yes [provider]  linaclotide  (LINZESS ) 145 MCG CAPS capsule Take 1 capsule (145 mcg total) by mouth daily before breakfast. 10/25/19  Yes Danford, Lonni SQUIBB, MD  Melatonin 5 MG TABS Take 10 mg by mouth at bedtime.   Yes [provider]  metoCLOPramide  (REGLAN ) 10 MG tablet Take 0.5 tablets (5 mg total) by mouth 3 (three) times daily before meals. 05/01/24  Yes Sreenath, Sudheer B, MD  metoprolol  succinate (TOPROL -XL) 25 MG 24 hr tablet Take 25 mg by mouth daily.   Yes [provider]  ondansetron  (ZOFRAN ) 4 MG tablet Take 4 mg by mouth daily.   Yes [provider]  oxyCODONE  10 MG TABS Take 0.5-1 tablets (5-10 mg total) by mouth every 6 (six) hours as needed for moderate pain (pain score 4-6) or severe pain (pain score 7-10). 05/16/24  Yes Fausto Sor A, DO  patiromer  (VELTASSA ) 8.4 g packet Take 2 packets (16.8 g total) by mouth daily. 05/16/24  Yes Fausto Sor A, DO  polyethylene glycol (MIRALAX  / GLYCOLAX ) 17 g packet Take 17 g by mouth 2 (two) times daily. 10/24/19  Yes Danford, Lonni SQUIBB, MD  senna-docusate (SENOKOT-S) 8.6-50 MG tablet Take 1 tablet by mouth 2 (two) times daily. 10/24/19  Yes Danford, Lonni SQUIBB, MD  simethicone  (MYLICON) 80 MG chewable tablet Chew 1 tablet (80 mg total) by mouth 4 (four) times daily. 05/01/24  Yes Sreenath, Sudheer B, MD  sitaGLIPtin (JANUVIA) 100 MG tablet Take 100 mg by mouth daily.   Yes [provider]  sodium phosphate  Pediatric (FLEET) 3.5-9.5 GM/59ML enema Place 1 enema rectally every other day as needed for severe constipation.   Yes [provider]  feeding supplement (ENSURE PLUS HIGH PROTEIN) LIQD Take 237 mLs by mouth 2 (two) times daily between meals. 05/16/24   Fausto Sor A, DO  furosemide  (LASIX ) 40 MG tablet Take 1 tablet (40 mg total) by mouth daily. Patient not taking: Reported on 05/22/2024 04/25/21   Patel, Sona, MD   hydrocortisone  cream 1 % Apply 1 Application topically daily.    [provider]  Vitamin D , Ergocalciferol , (DRISDOL ) 1.25 MG (50000 UNIT) CAPS capsule Take 1 capsule (50,000 Units total) by mouth every 7 (seven) days. 05/18/24   Fausto Sor LABOR, DO  pregabalin  (LYRICA ) 75 MG capsule Take 75 mg by mouth 2 (two) times daily. Patient not taking: Reported on 04/17/2021  04/20/21  [provider]    Social History   Socioeconomic History   Marital status: Single    Spouse name: Not on file   Number of children: Not on file   Years of education: Not on file   Highest education level: Not on file  Occupational History   Not on file  Tobacco Use   Smoking status: Some Days    Current packs/day: 0.25    Types: Cigarettes  Smokeless tobacco: Never  Vaping Use   Vaping status: Never Used  Substance and Sexual Activity   Alcohol use: Not Currently   Drug use: Not Currently   Sexual activity: Not Currently  Other Topics Concern   Not on file  Social History Narrative   Resides at [employer]   Social Drivers of Health   Financial Resource Strain: Medium Risk (02/02/2019)   Overall Financial Resource Strain (CARDIA)    Difficulty of Paying Living Expenses: Somewhat hard  Food Insecurity: Patient Declined (05/21/2024)   Hunger Vital Sign    Worried About Running Out of Food in the Last Year: Patient declined    Ran Out of Food in the Last Year: Patient declined  Transportation Needs: No Transportation Needs (05/22/2024)   PRAPARE - Administrator, Civil Service (Medical): No    Lack of Transportation (Non-Medical): No  Physical Activity: Unknown (02/02/2019)   Exercise Vital Sign    Days of Exercise per Week: 0 days    Minutes of Exercise per Session: Not on file  Stress: Stress Concern Present (02/02/2019)   [employer] of Occupational Health - Occupational Stress Questionnaire    Feeling of Stress : To some extent  Social Connections:  Somewhat Isolated (02/02/2019)   Social Connection and Isolation Panel    Frequency of Communication with Friends and Family: More than three times a week    Frequency of Social Gatherings with Friends and Family: More than three times a week    Attends Religious Services: More than 4 times per year    Active Member of Clubs or Organizations: Not on file    Attends Banker Meetings: Never    Marital Status: Never married  Intimate Partner Violence: Patient Declined (05/22/2024)   Humiliation, Afraid, Rape, and Kick questionnaire    Fear of Current or Ex-Partner: Patient declined    Emotionally Abused: Patient declined    Physically Abused: Patient declined    Sexually Abused: Patient declined     Family History  Problem Relation Age of Onset   Kidney failure Father    Kidney failure Brother     ROS: Otherwise negative unless mentioned in HPI  Physical Examination  Vitals:   05/22/24 0214 05/22/24 0352  BP: (!) 155/99 (!) 159/95  Pulse:  (!) 107  Resp: 20 (!) 21  Temp: 98.7 F (37.1 C) 98.4 F (36.9 C)  SpO2: 92% 96%   Body mass index is 57.44 kg/m.  General:  WDWN in NAD Gait: Not observed HENT: WNL, normocephalic Pulmonary: normal non-labored breathing, without Rales, rhonchi,  wheezing Cardiac: regular, without  Murmurs, rubs or gallops; without carotid bruits Abdomen: Positive bowel sounds, soft, NT/ND, no masses Skin: without rashes Vascular Exam/Pulses: Palpable upper extremities, Palpable femoral pulses bilat. Hx Bilat BKA  Extremities: without ischemic changes, without Gangrene , without cellulitis; without open wounds;  Musculoskeletal: no muscle wasting or atrophy  Neurologic: A&O X 3;  No focal weakness or paresthesias are detected; speech is fluent/normal Psychiatric:  The pt has Normal affect. Lymph:  Unremarkable  CBC    Component Value Date/Time   WBC 6.0 05/21/2024 2323   RBC 2.94 (L) 05/21/2024 2323   HGB 8.7 (L) 05/21/2024 2323    HCT 27.9 (L) 05/21/2024 2323   PLT 218 05/21/2024 2323   MCV 94.9 05/21/2024 2323   MCH 29.6 05/21/2024 2323   MCHC 31.2 05/21/2024 2323   RDW 12.9 05/21/2024 2323   LYMPHSABS 1.4 05/21/2024 2323  MONOABS 0.6 05/21/2024 2323   EOSABS 0.2 05/21/2024 2323   BASOSABS 0.0 05/21/2024 2323    BMET    Component Value Date/Time   NA 139 05/21/2024 2323   K 5.3 (H) 05/21/2024 2323   CL 106 05/21/2024 2323   CO2 23 05/21/2024 2323   GLUCOSE 166 (H) 05/21/2024 2323   BUN 49 (H) 05/21/2024 2323   CREATININE 4.91 (H) 05/21/2024 2323   CALCIUM  7.4 (L) 05/21/2024 2323   GFRNONAA 13 (L) 05/21/2024 2323   GFRAA >60 12/12/2019 0617    COAGS: Lab Results  Component Value Date   INR 1.1 04/20/2024   INR 1.0 09/07/2019   INR 1.1 02/26/2019     Non-Invasive Vascular Imaging:   None   Statin:  Yes.   Beta Blocker:  Yes.   Aspirin:  No. ACEI:  No. ARB:  No. CCB use:  No Other antiplatelets/anticoagulants:  No.    ASSESSMENT/PLAN: This is a 63 y.o. male with an extensive medical history and chronic kidney disease stage V with a baseline creatinine of 4.7 who has not started on any hemodialysis at this time presents to [facility] emergency department with shortness of breath wheezing.  He was recently discharged from the hospital 4 days ago on oxygen.  He was told to come back to the hospital if he gets shortness of breath got worse.  Nephrology has seen the patient and plan is to start hemodialysis.  He will need dialysis access.  Vascular surgery plans on taking the patient to the vascular lab later today on 05/22/2024 for dialysis tunneled permacatheter placement.  I had a long detailed discussion at the bedside this morning with the patient discussing the procedure, benefits, risk, complications.  Patient verbalizes understanding and wishes to proceed.  Patient has been n.p.o. since midnight.  Patient's last BUN was 49 his last creatinine was 4.91 this morning.  Patient's potassium is 5.3.   Patient's hemoglobin is stable at 8.7.   -I discussed the case in detail with Dr. Cordella Shawl MD and he agrees with plan.   Gwendlyn JONELLE Shank [facility] 05/22/2024 7:11 AM

## 2024-05-22 NOTE — Assessment & Plan Note (Addendum)
 Due to fluid overload from renal failure.  Echo in July showed EF 55-60% - IV Lasix  iron  20 mg twice a day and dialysis

## 2024-05-22 NOTE — Assessment & Plan Note (Addendum)
 On admission requiring 9L and symptomatically dyspnea and tachypneic.  Today on 7 L high flow nasal cannula.  Will need to have better oxygenation prior to disposition.  Started on empiric antibiotics on 9/1 for possibility of pneumonia.  CT scan of the chest for further evaluation.

## 2024-05-22 NOTE — Assessment & Plan Note (Addendum)
No acute concern

## 2024-05-22 NOTE — H&P (View-Only) (Signed)
 Hospital Consult    Reason for Consult:  Placement of Dialysis Perma Catheter Requesting Physician:  Dr Lonni Dalton MD  MRN #:  [id number]  History of Present Illness: This is a 63 y.o. male with hx CKD V baseline Cr 4.7 not yet on HD, MO, DM, bilateral BKA, hx hep C, remote history epidural abscess/vertebral osteo, now with neurogenic bladder s/p SP catheter, recent ileus, last admitted 8/7 to 8/23 for facial swelling and respiratory failure, diuresed and Nephrology consulted, treated medically. Discharged 4 days ago, on new O2.  Since discharge, has had dyspnea, waxing and waning, worse with lying flat.  Today, after eating, his breathing got much worse, he needed more oxygen to maintain O2 sats and they told him he needed to come to the hospital for HD.     In the ER, K 5.3, BUN 49, Cr 4.9 stable from last discharge.  However, short of breath, wheezing and requiring back up to 9L HFNC. Vascular Surgery consulted for dialysis access placement.   Past Medical History:  Diagnosis Date   Anxiety    Depression    Diabetes mellitus without complication (HCC)    Hepatitis C    Hyperlipidemia    IBS (irritable bowel syndrome)    Osteomyelitis (HCC)    Spinal stenosis     Past Surgical History:  Procedure Laterality Date   BACK SURGERY     bilateral amputation Bilateral    CENTRAL LINE INSERTION-TUNNELED N/A 02/02/2019   Procedure: CENTRAL LINE INSERTION-TUNNELED;  Surgeon: Marea Selinda RAMAN, MD;  Location: [facility];  Service: Cardiovascular;  Laterality: N/A;   COLONOSCOPY WITH PROPOFOL  N/A 01/12/2020   Procedure: COLONOSCOPY WITH PROPOFOL ;  Surgeon: Jinny Carmine, MD;  Location: [facility]OS[facility];  Service: Endoscopy;  Laterality: N/A;   ESOPHAGOGASTRODUODENOSCOPY (EGD) WITH PROPOFOL  N/A 12/07/2019   Procedure: ESOPHAGOGASTRODUODENOSCOPY (EGD) WITH PROPOFOL ;  Surgeon: Unk Corinn Skiff, MD;  Location: [facility]OSCOPY;  Service: Gastroenterology;  Laterality: N/A;   IR CATHETER  TUBE CHANGE  12/04/2019   SPINAL FUSION     THORACIC SPINE SURGERY  01/2019   extensive washout    No Known Allergies  Prior to Admission medications   Medication Sig Start Date End Date Taking? Authorizing Provider  acetaminophen  (TYLENOL ) 325 MG tablet Take 650 mg by mouth in the morning and at bedtime.   Yes [provider]  bisacodyl  (DULCOLAX) 10 MG suppository Place 10 mg rectally daily as needed for moderate constipation.   Yes [provider]  bisacodyl  (DULCOLAX) 5 MG EC tablet Take 2 tablets (10 mg total) by mouth at bedtime. 05/16/24  Yes Fausto Sor A, DO  cyanocobalamin  1000 MCG tablet Take 1 tablet (1,000 mcg total) by mouth daily. 05/17/24  Yes Fausto Sor A, DO  Doxepin  HCl 6 MG TABS Take 6 mg by mouth at bedtime.   Yes [provider]  famotidine  (PEPCID ) 10 MG tablet Take 1 tablet (10 mg total) by mouth daily. 05/17/24  Yes Fausto Sor A, DO  folic acid  (FOLVITE ) 1 MG tablet Take 1 tablet (1 mg total) by mouth daily. 05/17/24  Yes Fausto Sor A, DO  gabapentin  (NEURONTIN ) 300 MG capsule Take 300 mg by mouth at bedtime.   Yes [provider]  insulin  glargine-yfgn (SEMGLEE ) 100 UNIT/ML injection Inject 0.1 mLs (10 Units total) into the skin 2 (two) times daily. 05/01/24  Yes Sreenath, Sudheer B, MD  insulin  lispro (HUMALOG ) 100 UNIT/ML injection Inject 0-15 Units into the skin 4 (four) times daily -  before meals and at bedtime. <150= 0 units 151-200= 3 units 201-250= 6 units 251-300= 9 units 301-350= 12 units 351-400= 15 units >400 call MD Patient taking differently: Inject 0-15 Units into the skin 4 (four) times daily -  before meals and at bedtime. <150= 0 units 151-200= 3 units 201-250= 6 units 251-300= 9 units 301-350= 12 units 351-400= 15 units >400 call MD   Yes [provider]  Lactulose  20 GM/30ML SOLN Take 30 mLs by mouth 2 (two) times daily.   Yes [provider]  levothyroxine  (SYNTHROID ) 175  MCG tablet Take 175 mcg by mouth at bedtime.   Yes [provider]  linaclotide  (LINZESS ) 145 MCG CAPS capsule Take 1 capsule (145 mcg total) by mouth daily before breakfast. 10/25/19  Yes Danford, Lonni SQUIBB, MD  Melatonin 5 MG TABS Take 10 mg by mouth at bedtime.   Yes [provider]  metoCLOPramide  (REGLAN ) 10 MG tablet Take 0.5 tablets (5 mg total) by mouth 3 (three) times daily before meals. 05/01/24  Yes Sreenath, Sudheer B, MD  metoprolol  succinate (TOPROL -XL) 25 MG 24 hr tablet Take 25 mg by mouth daily.   Yes [provider]  ondansetron  (ZOFRAN ) 4 MG tablet Take 4 mg by mouth daily.   Yes [provider]  oxyCODONE  10 MG TABS Take 0.5-1 tablets (5-10 mg total) by mouth every 6 (six) hours as needed for moderate pain (pain score 4-6) or severe pain (pain score 7-10). 05/16/24  Yes Fausto Sor A, DO  patiromer  (VELTASSA ) 8.4 g packet Take 2 packets (16.8 g total) by mouth daily. 05/16/24  Yes Fausto Sor A, DO  polyethylene glycol (MIRALAX  / GLYCOLAX ) 17 g packet Take 17 g by mouth 2 (two) times daily. 10/24/19  Yes Danford, Lonni SQUIBB, MD  senna-docusate (SENOKOT-S) 8.6-50 MG tablet Take 1 tablet by mouth 2 (two) times daily. 10/24/19  Yes Danford, Lonni SQUIBB, MD  simethicone  (MYLICON) 80 MG chewable tablet Chew 1 tablet (80 mg total) by mouth 4 (four) times daily. 05/01/24  Yes Sreenath, Sudheer B, MD  sitaGLIPtin (JANUVIA) 100 MG tablet Take 100 mg by mouth daily.   Yes [provider]  sodium phosphate  Pediatric (FLEET) 3.5-9.5 GM/59ML enema Place 1 enema rectally every other day as needed for severe constipation.   Yes [provider]  feeding supplement (ENSURE PLUS HIGH PROTEIN) LIQD Take 237 mLs by mouth 2 (two) times daily between meals. 05/16/24   Fausto Sor A, DO  furosemide  (LASIX ) 40 MG tablet Take 1 tablet (40 mg total) by mouth daily. Patient not taking: Reported on 05/22/2024 04/25/21   Patel, Sona, MD   hydrocortisone  cream 1 % Apply 1 Application topically daily.    [provider]  Vitamin D , Ergocalciferol , (DRISDOL ) 1.25 MG (50000 UNIT) CAPS capsule Take 1 capsule (50,000 Units total) by mouth every 7 (seven) days. 05/18/24   Fausto Sor LABOR, DO  pregabalin  (LYRICA ) 75 MG capsule Take 75 mg by mouth 2 (two) times daily. Patient not taking: Reported on 04/17/2021  04/20/21  [provider]    Social History   Socioeconomic History   Marital status: Single    Spouse name: Not on file   Number of children: Not on file   Years of education: Not on file   Highest education level: Not on file  Occupational History   Not on file  Tobacco Use   Smoking status: Some Days    Current packs/day: 0.25    Types: Cigarettes  Smokeless tobacco: Never  Vaping Use   Vaping status: Never Used  Substance and Sexual Activity   Alcohol use: Not Currently   Drug use: Not Currently   Sexual activity: Not Currently  Other Topics Concern   Not on file  Social History Narrative   Resides at [employer]   Social Drivers of Health   Financial Resource Strain: Medium Risk (02/02/2019)   Overall Financial Resource Strain (CARDIA)    Difficulty of Paying Living Expenses: Somewhat hard  Food Insecurity: Patient Declined (05/21/2024)   Hunger Vital Sign    Worried About Running Out of Food in the Last Year: Patient declined    Ran Out of Food in the Last Year: Patient declined  Transportation Needs: No Transportation Needs (05/22/2024)   PRAPARE - Administrator, Civil Service (Medical): No    Lack of Transportation (Non-Medical): No  Physical Activity: Unknown (02/02/2019)   Exercise Vital Sign    Days of Exercise per Week: 0 days    Minutes of Exercise per Session: Not on file  Stress: Stress Concern Present (02/02/2019)   [employer] of Occupational Health - Occupational Stress Questionnaire    Feeling of Stress : To some extent  Social Connections:  Somewhat Isolated (02/02/2019)   Social Connection and Isolation Panel    Frequency of Communication with Friends and Family: More than three times a week    Frequency of Social Gatherings with Friends and Family: More than three times a week    Attends Religious Services: More than 4 times per year    Active Member of Clubs or Organizations: Not on file    Attends Banker Meetings: Never    Marital Status: Never married  Intimate Partner Violence: Patient Declined (05/22/2024)   Humiliation, Afraid, Rape, and Kick questionnaire    Fear of Current or Ex-Partner: Patient declined    Emotionally Abused: Patient declined    Physically Abused: Patient declined    Sexually Abused: Patient declined     Family History  Problem Relation Age of Onset   Kidney failure Father    Kidney failure Brother     ROS: Otherwise negative unless mentioned in HPI  Physical Examination  Vitals:   05/22/24 0214 05/22/24 0352  BP: (!) 155/99 (!) 159/95  Pulse:  (!) 107  Resp: 20 (!) 21  Temp: 98.7 F (37.1 C) 98.4 F (36.9 C)  SpO2: 92% 96%   Body mass index is 57.44 kg/m.  General:  WDWN in NAD Gait: Not observed HENT: WNL, normocephalic Pulmonary: normal non-labored breathing, without Rales, rhonchi,  wheezing Cardiac: regular, without  Murmurs, rubs or gallops; without carotid bruits Abdomen: Positive bowel sounds, soft, NT/ND, no masses Skin: without rashes Vascular Exam/Pulses: Palpable upper extremities, Palpable femoral pulses bilat. Hx Bilat BKA  Extremities: without ischemic changes, without Gangrene , without cellulitis; without open wounds;  Musculoskeletal: no muscle wasting or atrophy  Neurologic: A&O X 3;  No focal weakness or paresthesias are detected; speech is fluent/normal Psychiatric:  The pt has Normal affect. Lymph:  Unremarkable  CBC    Component Value Date/Time   WBC 6.0 05/21/2024 2323   RBC 2.94 (L) 05/21/2024 2323   HGB 8.7 (L) 05/21/2024 2323    HCT 27.9 (L) 05/21/2024 2323   PLT 218 05/21/2024 2323   MCV 94.9 05/21/2024 2323   MCH 29.6 05/21/2024 2323   MCHC 31.2 05/21/2024 2323   RDW 12.9 05/21/2024 2323   LYMPHSABS 1.4 05/21/2024 2323  MONOABS 0.6 05/21/2024 2323   EOSABS 0.2 05/21/2024 2323   BASOSABS 0.0 05/21/2024 2323    BMET    Component Value Date/Time   NA 139 05/21/2024 2323   K 5.3 (H) 05/21/2024 2323   CL 106 05/21/2024 2323   CO2 23 05/21/2024 2323   GLUCOSE 166 (H) 05/21/2024 2323   BUN 49 (H) 05/21/2024 2323   CREATININE 4.91 (H) 05/21/2024 2323   CALCIUM  7.4 (L) 05/21/2024 2323   GFRNONAA 13 (L) 05/21/2024 2323   GFRAA >60 12/12/2019 0617    COAGS: Lab Results  Component Value Date   INR 1.1 04/20/2024   INR 1.0 09/07/2019   INR 1.1 02/26/2019     Non-Invasive Vascular Imaging:   None   Statin:  Yes.   Beta Blocker:  Yes.   Aspirin:  No. ACEI:  No. ARB:  No. CCB use:  No Other antiplatelets/anticoagulants:  No.    ASSESSMENT/PLAN: This is a 63 y.o. male with an extensive medical history and chronic kidney disease stage V with a baseline creatinine of 4.7 who has not started on any hemodialysis at this time presents to [facility] emergency department with shortness of breath wheezing.  He was recently discharged from the hospital 4 days ago on oxygen.  He was told to come back to the hospital if he gets shortness of breath got worse.  Nephrology has seen the patient and plan is to start hemodialysis.  He will need dialysis access.  Vascular surgery plans on taking the patient to the vascular lab later today on 05/22/2024 for dialysis tunneled permacatheter placement.  I had a long detailed discussion at the bedside this morning with the patient discussing the procedure, benefits, risk, complications.  Patient verbalizes understanding and wishes to proceed.  Patient has been n.p.o. since midnight.  Patient's last BUN was 49 his last creatinine was 4.91 this morning.  Patient's potassium is 5.3.   Patient's hemoglobin is stable at 8.7.   -I discussed the case in detail with Dr. Cordella Shawl MD and he agrees with plan.   Gwendlyn JONELLE Shank [facility] 05/22/2024 7:11 AM

## 2024-05-22 NOTE — Assessment & Plan Note (Addendum)
 Likely combination between iron  deficiency and anemia of chronic disease.  Hemoglobin 8.5.  Watch with dialysis

## 2024-05-22 NOTE — Op Note (Signed)
 [facility]   OPERATIVE NOTE     PROCEDURE: 1. Insertion of a right IJ tunneled dialysis catheter. 2. Catheter placement and cannulation under ultrasound and fluoroscopic guidance  PRE-OPERATIVE DIAGNOSIS: end-stage renal requiring hemodialysis  POST-OPERATIVE DIAGNOSIS: same as above  SURGEON: Cordella Shawl  ANESTHESIA: Conscious sedation was administered under my direct supervision by the interventional radiology RN. IV Versed  plus fentanyl  were utilized. Continuous ECG, pulse oximetry and blood pressure was monitored throughout the entire procedure.  Conscious sedation was for a total of 41 minutes and 12 seconds.  ESTIMATED BLOOD LOSS: Minimal  FINDING(S): 1.  Tips of the catheter in the right atrium on fluoroscopy 2.  No obvious pneumothorax on fluoroscopy  SPECIMEN(S):  none  INDICATIONS:   Eugene [name] is a 63 y.o. male  presents with end stage renal disease.  Therefore, the patient requires a tunneled dialysis catheter placement.  The patient is informed of  the risks catheter placement include but are not limited to: bleeding, infection, central venous injury, pneumothorax, possible venous stenosis, possible malpositioning in the venous system, and possible infections related to long-term catheter presence.  The patient was aware of these risks and agreed to proceed.  DESCRIPTION: The patient was taken back to Special Procedure suite.  Prior to sedation, the patient was given IV antibiotics.  After obtaining adequate sedation, the patient was prepped and draped in the standard fashion for a right IJ tunneled dialysis catheter placement.  Appropriate Time Out is called.     The right neck and chest wall are then infiltrated with 1% Lidocaine  with epinepherine.  A 19 cm tip to cuff catheter is then selected, opened on the back table and prepped. Ultrasound is placed in a sterile sleeve.  Under ultrasound guidance, the right IJ vein is examined and  is noted to be echolucent and easily compressible indicating patency.   An image is recorded for the permanent record.  The right IJ vein is cannulated with the microneedle under direct ultrasound vissualization.  A Microwire followed by a micro sheath is then inserted without difficulty.   A J-wire was then advanced under fluoroscopic guidance into the inferior vena cava and the wire was secured.  Small counter incision was then made at the wire insertion site. A small pocket was fashioned with blunt dissection to allow easier passage of the cuff.  The dilator and peel-away sheath are then advanced over the wire under fluoroscopic guidance. The catheters and advanced through the peel-away sheath. It is approximated to the chest wall after verifying the tips at the atrial caval junction and an exit site is selected.  Small incision is made at the selected exit site and the tunneling device was passed subcutaneously to the counter incision. Catheter is then pulled through the subcutaneous tunnel. The catheter is then verified for tip position under fluoroscopy, transected and the hub assembly connected.    Each port was tested by aspirating and flushing.  No resistance was noted.  Each port was then thoroughly flushed with heparinized saline.  The catheter was secured in placed with two interrupted stitches of 0 silk tied to the catheter.  The counter incision was closed with a U-stitch of 4-0 Monocryl.  The insertion site is then cleaned and sterile bandages applied including a Biopatch.  Each port was then packed with concentrated heparin  (1000 Units/mL) at the manufacturer recommended volumes to each port.  Sterile caps were applied to each port.  On completion fluoroscopy, the tips of  the catheter were in the right atrium, and there was no evidence of pneumothorax.  COMPLICATIONS: None  CONDITION: Unchanged   Cordella Shawl [facility] Office: [phone]   05/22/2024, 3:21  PM

## 2024-05-22 NOTE — Interval H&P Note (Signed)
 History and Physical Interval Note:  05/22/2024 2:36 PM  Eugene [name]  has presented today for surgery, with the diagnosis of ESRD.  The various methods of treatment have been discussed with the patient and family. After consideration of risks, benefits and other options for treatment, the patient has consented to  Procedure(s): DIALYSIS/PERMA CATHETER INSERTION (N/A) as a surgical intervention.  The patient's history has been reviewed, patient examined, no change in status, stable for surgery.  I have reviewed the patient's chart and labs.  Questions were answered to the patient's satisfaction.     Cordella Shawl

## 2024-05-22 NOTE — ED Provider Notes (Signed)
 Continues to require increased oxygen requirement at 9 L/min high flow nasal.  This is despite DuoNeb and Solu-Medrol  treatment for suspected COPD exacerbation with bronchospasm.  Admission.   Cyrena Mylar, MD 05/22/24 [phone]

## 2024-05-22 NOTE — Hospital Course (Addendum)
 63 y.o. M with hx CKD V baseline Cr 4.7 not yet on HD, MO, DM, bilateral BKA, hx hep C, remote history epidural abscess/vertebral osteo, now with neurogenic bladder s/p SP catheter, recent ileus, last admitted 8/7 to 8/23 for facial swelling and respiratory failure, diuresed and Nephrology consulted, treated medically.  Discharged 4 days ago, on new O2.  Since discharge, has had dyspnea, waxing and waning, worse with lying flat.  Today, after eating, his breathing got much worse, he needed more oxygen to maintain O2 sats and they told him he needed to come to the hospital for HD.    In the ER, K 5.3, BUN 49, Cr 4.9 stable from last discharge.  However, short of breath, wheezing and requiring back up to 9L HFNC.   BNP 450, WBC normal, Hgb 8.7, K 5.3, Cr 4.9 lact normal, Cr 24  Troponin with a flat curve, CXR old left effusion   8/29: Vital stable, on 7 L of oxygen.  Nephrology was consulted and HD catheter placement was ordered, patient will be started on dialysis afterwards. 8/30.  Patient seen while on dialysis.  Was on 5 L of oxygen.  Feels a little bit better. 8/31.  Patient on 4 L high flow nasal cannula this morning.  Hemoglobin 7.9, potassium 5.0 9/1.  SVT with dialysis.  IV metoprolol  ordered.  Patient taken down for dialysis before toprol  given.  Will change toprol  to low dose coreg . 9/2.  SVT again with dialysis.  Increased dose of Coreg  and Cardizem  CD.  Unable to get a CT scan of the chest since patient is new to dialysis.  VQ scan also would not be a good test.  Will get a sonogram of the thighs to rule out DVT. 9/3: Hemodynamically stable and saturating well on 5 L of oxygen, patient was very resistant to wean further.  Lower extremity venous Doppler was negative for DVT.  D-dimer significantly elevated at 9.77.  VQ scan ordered-hoping can provide some utility.  Now had outpatient dialysis chair where he can start on 9/5.  9/4: Remained hemodynamically stable on baseline oxygen use of 5  to 6 L.  VQ scan was negative for any PE.  Discussed with nephrology and switched him to p.o. Lasix  80 mg daily. He will start his outpatient dialysis from tomorrow. Clinically appears euvolemic.  Patient is on opiates for chronic pain, history of severe constipation.  He is on multiple as needed medications, please give him appropriately and avoid constipation.  He was started on Cardizem  and carvedilol , home metoprolol  was discontinued.  Received antibiotics which were not continued on discharge.  Patient will continue on current medications and need to have a close follow-up with his providers for further assistance.

## 2024-05-22 NOTE — Assessment & Plan Note (Addendum)
 SABRA

## 2024-05-23 DIAGNOSIS — E875 Hyperkalemia: Secondary | ICD-10-CM

## 2024-05-23 DIAGNOSIS — N189 Chronic kidney disease, unspecified: Secondary | ICD-10-CM

## 2024-05-23 DIAGNOSIS — N179 Acute kidney failure, unspecified: Secondary | ICD-10-CM | POA: Diagnosis not present

## 2024-05-23 DIAGNOSIS — I5033 Acute on chronic diastolic (congestive) heart failure: Secondary | ICD-10-CM | POA: Diagnosis not present

## 2024-05-23 DIAGNOSIS — J9601 Acute respiratory failure with hypoxia: Secondary | ICD-10-CM | POA: Diagnosis not present

## 2024-05-23 LAB — RENAL FUNCTION PANEL
Albumin: 1.7 g/dL — ABNORMAL LOW (ref 3.5–5.0)
Anion gap: 9 (ref 5–15)
BUN: 52 mg/dL — ABNORMAL HIGH (ref 8–23)
CO2: 23 mmol/L (ref 22–32)
Calcium: 7.2 mg/dL — ABNORMAL LOW (ref 8.9–10.3)
Chloride: 106 mmol/L (ref 98–111)
Creatinine, Ser: 4.78 mg/dL — ABNORMAL HIGH (ref 0.61–1.24)
GFR, Estimated: 13 mL/min — ABNORMAL LOW (ref >60)
Glucose, Bld: 141 mg/dL — ABNORMAL HIGH (ref 70–99)
Phosphorus: 6.3 mg/dL — ABNORMAL HIGH (ref 2.5–4.6)
Potassium: 5.7 mmol/L — ABNORMAL HIGH (ref 3.5–5.1)
Sodium: 138 mmol/L (ref 135–145)

## 2024-05-23 LAB — CBC
HCT: 25.5 % — ABNORMAL LOW (ref 39.0–52.0)
Hemoglobin: 7.8 g/dL — ABNORMAL LOW (ref 13.0–17.0)
MCH: 29.2 pg (ref 26.0–34.0)
MCHC: 30.6 g/dL (ref 30.0–36.0)
MCV: 95.5 fL (ref 80.0–100.0)
Platelets: 220 K/uL (ref 150–400)
RBC: 2.67 MIL/uL — ABNORMAL LOW (ref 4.22–5.81)
RDW: 13.2 % (ref 11.5–15.5)
WBC: 6.6 K/uL (ref 4.0–10.5)
nRBC: 0 % (ref 0.0–0.2)

## 2024-05-23 LAB — GLUCOSE, CAPILLARY
Glucose-Capillary: 114 mg/dL — ABNORMAL HIGH (ref 70–99)
Glucose-Capillary: 121 mg/dL — ABNORMAL HIGH (ref 70–99)
Glucose-Capillary: 142 mg/dL — ABNORMAL HIGH (ref 70–99)
Glucose-Capillary: 144 mg/dL — ABNORMAL HIGH (ref 70–99)
Glucose-Capillary: 155 mg/dL — ABNORMAL HIGH (ref 70–99)

## 2024-05-23 MED ORDER — HEPARIN SODIUM (PORCINE) 1000 UNIT/ML IJ SOLN
INTRAMUSCULAR | Status: AC
  Filled 2024-05-23: qty 5

## 2024-05-23 NOTE — Assessment & Plan Note (Signed)
 AKI on CKD stage IV.  First dialysis on 8/30.  Will need an outpatient dialysis slot and 2 more dialysis sessions as inpatient.  PermCath placed on 8/29.  Today's creatinine 4.07.

## 2024-05-23 NOTE — Progress Notes (Signed)
 Progress Note   Patient: Eugene [name] FMW:[id number] DOB: [birth year] DOA: 05/21/2024     1 DOS: the patient was seen and examined on 05/23/2024   Brief hospital course: 63 y.o. M with hx CKD V baseline Cr 4.7 not yet on HD, MO, DM, bilateral BKA, hx hep C, remote history epidural abscess/vertebral osteo, now with neurogenic bladder s/p SP catheter, recent ileus, last admitted 8/7 to 8/23 for facial swelling and respiratory failure, diuresed and Nephrology consulted, treated medically.  Discharged 4 days ago, on new O2.  Since discharge, has had dyspnea, waxing and waning, worse with lying flat.  Today, after eating, his breathing got much worse, he needed more oxygen to maintain O2 sats and they told him he needed to come to the hospital for HD.    In the ER, K 5.3, BUN 49, Cr 4.9 stable from last discharge.  However, short of breath, wheezing and requiring back up to 9L HFNC.   BNP 450, WBC normal, Hgb 8.7, K 5.3, Cr 4.9 lact normal, Cr 24  Troponin with a flat curve, CXR old left effusion   8/29: Vital stable, on 7 L of oxygen.  Nephrology was consulted and HD catheter placement was ordered, patient will be started on dialysis afterwards. 8/30.  Patient seen while on dialysis.  Was on 5 L of oxygen.  Feels a little bit better.  Assessment and Plan: * Acute kidney injury superimposed on CKD (HCC) AKI on CKD stage IV.  Creatinine 4.78 with a GFR of 13.  PermCath placed last night.  Dialysis today.  Acute respiratory failure with hypoxia (HCC) On admission requiring 9L and symptomatically dyspnea and tachypneic.  Today on 5 L while on dialysis.  Hyperkalemia Dialysis to manage  Class 3 obesity BMI 55.03, complicates care  Acute on chronic diastolic CHF (congestive heart failure) (HCC) Due to fluid overload from renal failure.  Echo in July showed EF 55-60% - IV Lasix  and dialysis  Hypothyroidism Continue levothyroxine   Essential hypertension - IV Lasix  - Continue  metoprolol   Type 2 diabetes mellitus with peripheral neuropathy (HCC) Last A1c 6.0, on Tradjenta   S/P bilateral BKA (below knee amputation) (HCC)    Suprapubic catheter (HCC)    Normocytic anemia Likely combination between iron  deficiency and anemia of chronic disease.  Hemoglobin 7.8.  Watch with dialysis  Chronic hepatitis C without hepatic coma (HCC) Follow-up as outpatient  Chronic pain syndrome Continue home oxycodone         Subjective: Patient seen down at dialysis.  Does complain of some shortness of breath.  On 5 L of oxygen.  Physical Exam: Vitals:   05/23/24 1130 05/23/24 1206 05/23/24 1207 05/23/24 1237  BP: 137/72 (!) 155/68 136/70 (!) 154/66  Pulse: (!) 36 (!) 34 71 (!) 36  Resp: (!) 23 15 19 19   Temp:  97.7 F (36.5 C)  98.2 F (36.8 C)  TempSrc:  Oral  Oral  SpO2: 94% 97% 96% 96%  Weight:   127.8 kg    Physical Exam HENT:     Head: Normocephalic.  Eyes:     General: Lids are normal.     Conjunctiva/sclera: Conjunctivae normal.  Cardiovascular:     Rate and Rhythm: Normal rate and regular rhythm.     Heart sounds: Normal heart sounds, S1 normal and S2 normal.  Pulmonary:     Breath sounds: Examination of the right-lower field reveals decreased breath sounds. Examination of the left-lower field reveals decreased breath sounds. Decreased breath sounds present. No  wheezing, rhonchi or rales.  Abdominal:     Palpations: Abdomen is soft.     Tenderness: There is no abdominal tenderness.  Musculoskeletal:     Right Lower Extremity: Right leg is amputated below knee.     Left Lower Extremity: Left leg is amputated below knee.  Skin:    General: Skin is warm.     Findings: No rash.  Neurological:     Mental Status: He is alert and oriented to person, place, and time.     Data Reviewed: Potassium 5.7, creatinine 4.78, albumin  1.7, hemoglobin 7.8  Disposition: Status is: Inpatient Remains inpatient appropriate because: Will need 3 dialysis  sessions and an outpatient dialysis slot prior to disposition.  Planned Discharge Destination: Long-term care    Time spent: 28 minutes  Author: Charlie Patterson, MD 05/23/2024 12:45 PM  For on call review [email or web address].

## 2024-05-23 NOTE — Progress Notes (Addendum)
  Patient refused blood sugar check and refused  insulin  at 0000    Patient refused vital checking

## 2024-05-23 NOTE — Assessment & Plan Note (Signed)
 Improved with dialysis treatments

## 2024-05-23 NOTE — Progress Notes (Signed)
 Hemodialysis Note:  Received patient in bed to unit. Alert and oriented. Informed consent singed and in chart.  Treatment initiated: 0809 Treatment completed: 1207  Access used: Right internal jugular catheter Access issues: None  Patient tolerated well. Transported back to room, alert without acute distress. Report given to patient's RN.  Total UF removed: 300 ml Medications given: None Post HD VS:  Post HD weight: 127.8 Kg  [facility]

## 2024-05-23 NOTE — Progress Notes (Signed)
 [facility]  ROUNDING NOTE   Subjective:   Eugene [name]  is a 63 y.o.  male  with past medical conditions including diabetes, bilateral BKA, suprapubic catheter, and hepatitis C. Patient presents to ED from SNF with shortness of breath and has been admitted for Bronchospasm [J98.01] Hypoxia [R09.02] ESRD (end stage renal disease) (HCC) [N18.6]  Patient is known to our practice from recent admission.   Patient seen and evaluated during dialysis   HEMODIALYSIS FLOWSHEET:  Blood Flow Rate (mL/min): 199 mL/min Arterial Pressure (mmHg): -104.84 mmHg Venous Pressure (mmHg): 224.03 mmHg TMP (mmHg): 10.1 mmHg Ultrafiltration Rate (mL/min): 471 mL/min Dialysate Flow Rate (mL/min): 199 ml/min  Patient tolerated first treatment well   08/29 0701 - 08/30 0700 In: -  Out: 600 [Urine:600]  Lab Results  Component Value Date   CREATININE 4.78 (H) 05/23/2024   CREATININE 5.03 (H) 05/22/2024   CREATININE 4.91 (H) 05/21/2024     Objective:  Vital signs in last 24 hours:  Temp:  [96.7 F (35.9 C)-98.4 F (36.9 C)] 98.4 F (36.9 C) (08/30 0757) Pulse Rate:  [63-105] 65 (08/30 0830) Resp:  [0-20] 10 (08/30 0830) BP: (112-157)/(63-99) 138/67 (08/30 0830) SpO2:  [89 %-100 %] 97 % (08/30 0830) Weight:  [128.1 kg] 128.1 kg (08/30 0749)  Weight change:  Filed Weights   05/21/24 2216 05/23/24 0749  Weight: 133.4 kg 128.1 kg    Intake/Output: I/O last 3 completed shifts: In: -  Out: 1500 [Urine:1500]   Intake/Output this shift:  No intake/output data recorded.  Physical Exam: General: NAD, laying in bed  Head: Normocephalic  Eyes: Anicteric  Lungs:  Diminished, 5L HFNC  Heart: Regular rate   Abdomen:  Distended and firm, obese  Extremities: 1-2+ peripheral edema.,  Bilateral BKA  Neurologic: Awake and alert  Skin: No rashes  Access: Right IJ PermCath  Suprapubic catheter in place  Basic Metabolic Panel: Recent Labs  Lab 05/16/24 1039 05/21/24 2323  05/22/24 1718 05/23/24 0819  NA 139 139 140 138  K 4.7 5.3* 5.6* 5.7*  CL 106 106 107 106  CO2 24 23 24 23   GLUCOSE 149* 166* 175* 141*  BUN 61* 49* 51* 52*  CREATININE 4.62* 4.91* 5.03* 4.78*  CALCIUM  7.2* 7.4* 7.4* 7.2*  PHOS 4.9*  --   --  6.3*    Liver Function Tests: Recent Labs  Lab 05/16/24 1039 05/21/24 2323 05/23/24 0819  AST  --  16  --   ALT  --  14  --   ALKPHOS  --  49  --   BILITOT  --  0.4  --   PROT  --  5.6*  --   ALBUMIN  1.7* 1.8* 1.7*   No results for input(s): LIPASE, AMYLASE in the last 168 hours.  No results for input(s): AMMONIA in the last 168 hours.  CBC: Recent Labs  Lab 05/21/24 2323 05/22/24 1718 05/23/24 0819  WBC 6.0 3.8* 6.6  NEUTROABS 3.8  --   --   HGB 8.7* 7.9* 7.8*  HCT 27.9* 25.0* 25.5*  MCV 94.9 93.6 95.5  PLT 218 218 220    Cardiac Enzymes: No results for input(s): CKTOTAL, CKMB, CKMBINDEX, TROPONINI in the last 168 hours.  BNP: Invalid input(s): POCBNP  CBG: Recent Labs  Lab 05/22/24 1136 05/22/24 1621 05/22/24 1959 05/23/24 0020 05/23/24 0336  GLUCAP 182* 153* 149* 144* 142*    Microbiology: Results for orders placed or performed during the hospital encounter of 05/21/24  Blood culture (routine  x 2)     Status: None (Preliminary result)   Collection Time: 05/21/24 11:23 PM   Specimen: BLOOD  Result Value Ref Range Status   Specimen Description BLOOD RIGHT ANTECUBITAL  Final   Special Requests   Final    BOTTLES DRAWN AEROBIC AND ANAEROBIC Blood Culture adequate volume   Culture   Final    NO GROWTH 1 DAY Performed at [facility], [address] [place], [place] [address]    Report Status PENDING  Incomplete  Blood culture (routine x 2)     Status: None (Preliminary result)   Collection Time: 05/21/24 11:23 PM   Specimen: BLOOD  Result Value Ref Range Status   Specimen Description BLOOD LEFT ANTECUBITAL  Final   Special Requests   Final    BOTTLES DRAWN AEROBIC AND  ANAEROBIC Blood Culture results may not be optimal due to an inadequate volume of blood received in culture bottles   Culture   Final    NO GROWTH 1 DAY Performed at [facility], [address] [place], [place] [address]    Report Status PENDING  Incomplete    Coagulation Studies: No results for input(s): LABPROT, INR in the last 72 hours.  Urinalysis: Recent Labs    05/21/24 2317  COLORURINE YELLOW*  LABSPEC 1.017  PHURINE 6.0  GLUCOSEU 150*  HGBUR SMALL*  BILIRUBINUR NEGATIVE  KETONESUR NEGATIVE  PROTEINUR >=300*  NITRITE NEGATIVE  LEUKOCYTESUR SMALL*       Imaging: PERIPHERAL VASCULAR CATHETERIZATION Result Date: 05/22/2024 See surgical note for result.  DG Chest Portable 1 View Result Date: 05/21/2024 CLINICAL DATA:  Respiratory distress EXAM: PORTABLE CHEST 1 VIEW COMPARISON:  05/12/2024 FINDINGS: Cardiac shadow is enlarged. Postsurgical changes are again noted in the thoracolumbar spine. Stable apparent loculated left effusion is noted. Small right effusion is noted as well. Mild bibasilar atelectasis is seen similar to that noted on the prior exam. No findings of fluid overload are seen. IMPRESSION: Overall stable appearance of the chest when compare with the prior exam. Electronically Signed   By: Oneil Devonshire M.D.   On: 05/21/2024 23:01      Medications:        bisacodyl   10 mg Oral QHS   Chlorhexidine  Gluconate Cloth  6 each Topical Q0600   famotidine   10 mg Oral Daily   furosemide   80 mg Intravenous BID   And   furosemide   40 mg Intravenous BID   heparin   5,000 Units Subcutaneous Q8H   insulin  aspart  0-9 Units Subcutaneous Q4H   lactulose   20 g Oral BID   linaclotide   145 mcg Oral QAC breakfast   linagliptin   5 mg Oral Daily   melatonin  10 mg Oral QHS   metoprolol  succinate  25 mg Oral Daily   patiromer   16.8 g Oral Daily   polyethylene glycol  17 g Oral BID   senna-docusate  1 tablet Oral BID   acetaminophen  **OR**  acetaminophen , bisacodyl , ondansetron  **OR** ondansetron  (ZOFRAN ) IV, oxyCODONE   Assessment/ Plan:  [name] is a 63 y.o.  male  with past medical conditions including diabetes, bilateral BKA, suprapubic catheter, hepatitis C, who was admitted to [facility] on 05/21/2024 for Bronchospasm [J98.01] Hypoxia [R09.02] ESRD (end stage renal disease) (HCC) [N18.6]    Acute kidney injury with proteinuria and hyperkalemia. On chronic kidney disease stage IV, creatinine 3.74, GFR of 18 on 04/30/24. Acute kidney injury likely secondary to progression of kidney disease and volume overload. Patient  has had multiple recent admissions where he has  remained borderline but stable with renal function. It has been discussed with patient during recent admissions that he will likely require dialysis in the near future. Patient understands that due to current symptoms and lack of effective response to medications, we feel it is time to pursue dialysis.  We appreciate vascular surgery placing right IJ PermCath on 05/22/2024.  Patient received initial dialysis treatment today, UF 0.  Next treatment scheduled for Monday.  Dialysis navigator notified of need of outpatient dialysis clinic.  Patient will need to sit in chair for dialysis prior to discharging. Lab Results  Component Value Date   CREATININE 4.78 (H) 05/23/2024   CREATININE 5.03 (H) 05/22/2024   CREATININE 4.91 (H) 05/21/2024     Intake/Output Summary (Last 24 hours) at 05/23/2024 0950 Last data filed at 05/22/2024 2100 Gross per 24 hour  Intake --  Output 600 ml  Net -600 ml    2. Acute respiratory failure requiring increased oxygen.  Weaned to 5 L HFNC.  Chest xray negative for edema.     3.  Hypertension with chronic kidney disease.  Currently receiving IV furosemide  120mg  twice daily and metoprolol .  Blood pressure 138/67 during dialysis.  He said that   4.  Nephrotic syndrome including proteinuria, Hypoalbuminemia-likely secondary to diabetic  nephropathy. Urine protein to creatinine ratio of 15.4 from previous admission.  Proteinuria likely secondary to diabetic nephropathy.  Hemoglobin A1c of 6.0% from 04/14/2024.  Glucose well-controlled.  5. Anemia of chronic kidney disease Lab Results  Component Value Date   HGB 7.8 (L) 05/23/2024    Hemoglobin 7.8.  Previous iron  studies acceptable.  Will initiate Retacrit  on Monday if needed.   LOS: 1 Eugene [name] 8/30/20259:50 AM

## 2024-05-23 NOTE — Plan of Care (Signed)

## 2024-05-23 NOTE — Progress Notes (Deleted)
 Patient refused blood sugar check and did want to get insulin  at 0000

## 2024-05-24 DIAGNOSIS — I5033 Acute on chronic diastolic (congestive) heart failure: Secondary | ICD-10-CM | POA: Diagnosis not present

## 2024-05-24 DIAGNOSIS — J9601 Acute respiratory failure with hypoxia: Secondary | ICD-10-CM | POA: Diagnosis not present

## 2024-05-24 DIAGNOSIS — N179 Acute kidney failure, unspecified: Secondary | ICD-10-CM | POA: Diagnosis not present

## 2024-05-24 DIAGNOSIS — D696 Thrombocytopenia, unspecified: Secondary | ICD-10-CM | POA: Insufficient documentation

## 2024-05-24 DIAGNOSIS — E875 Hyperkalemia: Secondary | ICD-10-CM | POA: Diagnosis not present

## 2024-05-24 LAB — CBC
HCT: 26.1 % — ABNORMAL LOW (ref 39.0–52.0)
Hemoglobin: 7.9 g/dL — ABNORMAL LOW (ref 13.0–17.0)
MCH: 28.9 pg (ref 26.0–34.0)
MCHC: 30.3 g/dL (ref 30.0–36.0)
MCV: 95.6 fL (ref 80.0–100.0)
Platelets: 148 K/uL — ABNORMAL LOW (ref 150–400)
RBC: 2.73 MIL/uL — ABNORMAL LOW (ref 4.22–5.81)
RDW: 13.3 % (ref 11.5–15.5)
WBC: 5.2 K/uL (ref 4.0–10.5)
nRBC: 0 % (ref 0.0–0.2)

## 2024-05-24 LAB — TYPE AND SCREEN
ABO/RH(D): A POS
Antibody Screen: NEGATIVE

## 2024-05-24 LAB — BASIC METABOLIC PANEL WITH GFR
Anion gap: 7 (ref 5–15)
BUN: 45 mg/dL — ABNORMAL HIGH (ref 8–23)
CO2: 24 mmol/L (ref 22–32)
Calcium: 7 mg/dL — ABNORMAL LOW (ref 8.9–10.3)
Chloride: 107 mmol/L (ref 98–111)
Creatinine, Ser: 4.07 mg/dL — ABNORMAL HIGH (ref 0.61–1.24)
GFR, Estimated: 16 mL/min — ABNORMAL LOW (ref >60)
Glucose, Bld: 167 mg/dL — ABNORMAL HIGH (ref 70–99)
Potassium: 5 mmol/L (ref 3.5–5.1)
Sodium: 138 mmol/L (ref 135–145)

## 2024-05-24 LAB — MISC LABCORP TEST (SEND OUT): Labcorp test code: 121265

## 2024-05-24 LAB — GLUCOSE, CAPILLARY
Glucose-Capillary: 116 mg/dL — ABNORMAL HIGH (ref 70–99)
Glucose-Capillary: 134 mg/dL — ABNORMAL HIGH (ref 70–99)
Glucose-Capillary: 137 mg/dL — ABNORMAL HIGH (ref 70–99)
Glucose-Capillary: 145 mg/dL — ABNORMAL HIGH (ref 70–99)
Glucose-Capillary: 159 mg/dL — ABNORMAL HIGH (ref 70–99)
Glucose-Capillary: 163 mg/dL — ABNORMAL HIGH (ref 70–99)

## 2024-05-24 LAB — HEPATITIS B CORE ANTIBODY, TOTAL: HEP B CORE AB: NEGATIVE

## 2024-05-24 LAB — HEPATITIS B SURFACE ANTIBODY, QUANTITATIVE: Hep B S AB Quant (Post): <3.5 m[IU]/mL — ABNORMAL LOW

## 2024-05-24 MED ORDER — EPOETIN ALFA-EPBX 10000 UNIT/ML IJ SOLN
10000.0000 [IU] | Freq: Once | INTRAMUSCULAR | Status: AC
  Administered 2024-05-25: 10000 [IU] via INTRAVENOUS
  Filled 2024-05-24: qty 2

## 2024-05-24 NOTE — Progress Notes (Signed)
 [facility]  ROUNDING NOTE   Subjective:   Eugene [name]  is a 63 y.o.  male  with past medical conditions including diabetes, bilateral BKA, suprapubic catheter, and hepatitis C. Patient [facility] ED from [facility] with shortness of breath and has been admitted for Bronchospasm [J98.01] Hypoxia [R09.02] ESRD (end stage renal disease) (HCC) [N18.6]  Patient is known to our practice from recent admission.   Update: Patient sitting up in bed Breakfast at bedside Denies shortness of breath   08/30 0701 - 08/31 0700 In: 960 [P.O.:960] Out: 1850 [Urine:1550]  Lab Results  Component Value Date   CREATININE 4.07 (H) 05/24/2024   CREATININE 4.78 (H) 05/23/2024   CREATININE 5.03 (H) 05/22/2024     Objective:  Vital signs in last 24 hours:  Temp:  [97.7 F (36.5 C)-98.6 F (37 C)] 98.3 F (36.8 C) (08/31 0819) Pulse Rate:  [34-76] 58 (08/31 1032) Resp:  [15-23] 18 (08/31 0819) BP: (127-155)/(66-85) 140/76 (08/31 1032) SpO2:  [91 %-97 %] 93 % (08/31 0819) Weight:  [127.8 kg] 127.8 kg (08/30 1207)  Weight change:  Filed Weights   05/21/24 2216 05/23/24 0749 05/23/24 1207  Weight: 133.4 kg 128.1 kg 127.8 kg    Intake/Output: I/O last 3 completed shifts: In: 960 [P.O.:960] Out: 2050 [Urine:1750; Other:300]   Intake/Output this shift:  Total I/O In: 480 [P.O.:480] Out: -   Physical Exam: General: NAD, laying in bed  Head: Normocephalic  Eyes: Anicteric  Lungs:  Diminished, 4L HFNC  Heart: Regular rate   Abdomen:  Distended and firm, obese  Extremities: 1-2+ peripheral edema.,  Bilateral BKA  Neurologic: Awake and alert  Skin: No rashes  Access: Right IJ PermCath  Suprapubic catheter in place  Basic Metabolic Panel: Recent Labs  Lab 05/21/24 2323 05/22/24 1718 05/23/24 0819 05/24/24 0642  NA 139 140 138 138  K 5.3* 5.6* 5.7* 5.0  CL 106 107 106 107  CO2 23 24 23 24   GLUCOSE 166* 175* 141* 167*  BUN 49* 51* 52* 45*  CREATININE 4.91* 5.03*  4.78* 4.07*  CALCIUM  7.4* 7.4* 7.2* 7.0*  PHOS  --   --  6.3*  --     Liver Function Tests: Recent Labs  Lab 05/21/24 2323 05/23/24 0819  AST 16  --   ALT 14  --   ALKPHOS 49  --   BILITOT 0.4  --   PROT 5.6*  --   ALBUMIN  1.8* 1.7*   No results for input(s): LIPASE, AMYLASE in the last 168 hours.  No results for input(s): AMMONIA in the last 168 hours.  CBC: Recent Labs  Lab 05/21/24 2323 05/22/24 1718 05/23/24 0819 05/24/24 0642  WBC 6.0 3.8* 6.6 5.2  NEUTROABS 3.8  --   --   --   HGB 8.7* 7.9* 7.8* 7.9*  HCT 27.9* 25.0* 25.5* 26.1*  MCV 94.9 93.6 95.5 95.6  PLT 218 218 220 148*    Cardiac Enzymes: No results for input(s): CKTOTAL, CKMB, CKMBINDEX, TROPONINI in the last 168 hours.  BNP: Invalid input(s): POCBNP  CBG: Recent Labs  Lab 05/23/24 1546 05/23/24 2020 05/24/24 0013 05/24/24 0430 05/24/24 0853  GLUCAP 155* 114* 163* 145* 134*    Microbiology: Results for orders placed or performed during the hospital encounter of 05/21/24  Blood culture (routine x 2)     Status: None (Preliminary result)   Collection Time: 05/21/24 11:23 PM   Specimen: BLOOD  Result Value Ref Range Status   Specimen Description BLOOD  RIGHT ANTECUBITAL  Final   Special Requests   Final    BOTTLES DRAWN AEROBIC AND ANAEROBIC Blood Culture adequate volume   Culture   Final    NO GROWTH 2 DAYS Performed at [facility], [address] [place], [place] [address]    Report Status PENDING  Incomplete  Blood culture (routine x 2)     Status: None (Preliminary result)   Collection Time: 05/21/24 11:23 PM   Specimen: BLOOD  Result Value Ref Range Status   Specimen Description BLOOD LEFT ANTECUBITAL  Final   Special Requests   Final    BOTTLES DRAWN AEROBIC AND ANAEROBIC Blood Culture results may not be optimal due to an inadequate volume of blood received in culture bottles   Culture   Final    NO GROWTH 2 DAYS Performed at [facility],  [address], [place], [place] [address]    Report Status PENDING  Incomplete    Coagulation Studies: No results for input(s): LABPROT, INR in the last 72 hours.  Urinalysis: Recent Labs    05/21/24 2317  COLORURINE YELLOW*  LABSPEC 1.017  PHURINE 6.0  GLUCOSEU 150*  HGBUR SMALL*  BILIRUBINUR NEGATIVE  KETONESUR NEGATIVE  PROTEINUR >=300*  NITRITE NEGATIVE  LEUKOCYTESUR SMALL*       Imaging: PERIPHERAL VASCULAR CATHETERIZATION Result Date: 05/22/2024 See surgical note for result.     Medications:        bisacodyl   10 mg Oral QHS   Chlorhexidine  Gluconate Cloth  6 each Topical Q0600   famotidine   10 mg Oral Daily   furosemide   80 mg Intravenous BID   And   furosemide   40 mg Intravenous BID   heparin   5,000 Units Subcutaneous Q8H   insulin  aspart  0-9 Units Subcutaneous Q4H   lactulose   20 g Oral BID   linaclotide   145 mcg Oral QAC breakfast   linagliptin   5 mg Oral Daily   melatonin  10 mg Oral QHS   metoprolol  succinate  25 mg Oral Daily   patiromer   16.8 g Oral Daily   polyethylene glycol  17 g Oral BID   senna-docusate  1 tablet Oral BID   acetaminophen  **OR** acetaminophen , bisacodyl , ondansetron  **OR** ondansetron  (ZOFRAN ) IV, oxyCODONE   Assessment/ Plan:  [name] is a 63 y.o.  male  with past medical conditions including diabetes, bilateral BKA, suprapubic catheter, hepatitis C, who was admitted to [facility] on 05/21/2024 for Bronchospasm [J98.01] Hypoxia [R09.02] ESRD (end stage renal disease) (HCC) [N18.6]    Acute kidney injury with proteinuria and hyperkalemia. On chronic kidney disease stage IV, creatinine 3.74, GFR of 18 on 04/30/24. Acute kidney injury likely secondary to progression of kidney disease and volume overload. Patient has had multiple recent admissions where he has  remained borderline but stable with renal function. It has been discussed with patient during recent admissions that he will likely require dialysis in the  near future. Patient understands that due to current symptoms and lack of effective response to medications, we feel it is time to pursue dialysis.  We appreciate vascular surgery placing right IJ PermCath on 05/22/2024.  Patient received initial dialysis treatment yesterday, UF achieved.  Next treatment scheduled for Monday.  Dialysis navigator notified of need of outpatient dialysis clinic.  Patient will need to sit in chair for dialysis prior to discharging. Lab Results  Component Value Date   CREATININE 4.07 (H) 05/24/2024   CREATININE 4.78 (H) 05/23/2024   CREATININE 5.03 (H)  05/22/2024     Intake/Output Summary (Last 24 hours) at 05/24/2024 1110 Last data filed at 05/24/2024 0900 Gross per 24 hour  Intake 1440 ml  Output 1850 ml  Net -410 ml    2. Acute respiratory failure requiring increased oxygen.  Weaned to 4 L HFNC.  Chest xray negative for edema.     3.  Hypertension with chronic kidney disease.  Currently receiving IV furosemide  120mg  twice daily and metoprolol .  Blood pressure stable   4.  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 Urine protein to creatinine ratio of 15.4 from previous admission.  Proteinuria likely secondary to diabetic nephropathy.  Hemoglobin A1c of 6.0% from 04/14/2024.  Primary team will continue management of SSI  5. Anemia of chronic kidney disease Lab Results  Component Value Date   HGB 7.9 (L) 05/24/2024    Hemoglobin 7.9.  Previous iron  studies acceptable.  Will initiate Retacrit  on Monday if needed.   LOS: 2 Nason Conradt 8/31/202511:10 AM

## 2024-05-24 NOTE — Assessment & Plan Note (Signed)
 Platelet count in normal range today

## 2024-05-24 NOTE — Plan of Care (Signed)
  Problem: Education: Goal: Knowledge of General Education information will improve Description: Including pain rating scale, medication(s)/side effects and non-pharmacologic comfort measures Outcome: Progressing   Problem: Nutrition: Goal: Adequate nutrition will be maintained Outcome: Progressing   Problem: Elimination: Goal: Will not experience complications related to bowel motility Outcome: Progressing Goal: Will not experience complications related to urinary retention Outcome: Progressing   Problem: Pain Managment: Goal: General experience of comfort will improve and/or be controlled Outcome: Progressing   Problem: Safety: Goal: Ability to remain free from injury will improve Outcome: Progressing   Problem: Skin Integrity: Goal: Risk for impaired skin integrity will decrease Outcome: Progressing   Problem: Education: Goal: Ability to describe self-care measures that may prevent or decrease complications (Diabetes Survival Skills Education) will improve Outcome: Progressing Goal: Individualized Educational Video(s) Outcome: Progressing   Problem: Nutritional: Goal: Maintenance of adequate nutrition will improve Outcome: Progressing Goal: Progress toward achieving an optimal weight will improve Outcome: Progressing   Problem: Skin Integrity: Goal: Risk for impaired skin integrity will decrease Outcome: Progressing   Problem: Tissue Perfusion: Goal: Adequacy of tissue perfusion will improve Outcome: Progressing

## 2024-05-24 NOTE — Progress Notes (Signed)
 Progress Note   Patient: Eugene [name] FMW:[id number] DOB: [birth year] DOA: 05/21/2024     2 DOS: the patient was seen and examined on 05/24/2024   Brief hospital course: 63 y.o. M with hx CKD V baseline Cr 4.7 not yet on HD, MO, DM, bilateral BKA, hx hep C, remote history epidural abscess/vertebral osteo, now with neurogenic bladder s/p SP catheter, recent ileus, last admitted 8/7 to 8/23 for facial swelling and respiratory failure, diuresed and Nephrology consulted, treated medically.  Discharged 4 days ago, on new O2.  Since discharge, has had dyspnea, waxing and waning, worse with lying flat.  Today, after eating, his breathing got much worse, he needed more oxygen to maintain O2 sats and they told him he needed to come to the hospital for HD.    In the ER, K 5.3, BUN 49, Cr 4.9 stable from last discharge.  However, short of breath, wheezing and requiring back up to 9L HFNC.   BNP 450, WBC normal, Hgb 8.7, K 5.3, Cr 4.9 lact normal, Cr 24  Troponin with a flat curve, CXR old left effusion   8/29: Vital stable, on 7 L of oxygen.  Nephrology was consulted and HD catheter placement was ordered, patient will be started on dialysis afterwards. 8/30.  Patient seen while on dialysis.  Was on 5 L of oxygen.  Feels a little bit better. 8/31.  Patient on 4 L high flow nasal cannula this morning.  Hemoglobin 7.9, potassium 5.0  Assessment and Plan: * Acute kidney injury superimposed on CKD (HCC) AKI on CKD stage IV.  First dialysis on 8/30.  Will need an outpatient dialysis slot and 2 more dialysis sessions as inpatient.  PermCath placed on 8/29.  Today's creatinine 4.07.  Acute respiratory failure with hypoxia (HCC) On admission requiring 9L and symptomatically dyspnea and tachypneic.  Today on 4 L high flow nasal cannula.  Hyperkalemia Improved down to 5.0 with dialysis  Acute on chronic diastolic CHF (congestive heart failure) (HCC) Due to fluid overload from renal failure.  Echo in July  showed EF 55-60% - IV Lasix  iron  20 mg twice a day and dialysis  Thrombocytopenia (HCC) Continue to monitor platelet count  Class 3 obesity BMI 55.03, complicates care  Hypothyroidism Continue levothyroxine   Essential hypertension - IV Lasix  - Continue metoprolol   Type 2 diabetes mellitus with peripheral neuropathy (HCC) Last A1c 6.0, on Tradjenta   S/P bilateral BKA (below knee amputation) (HCC)    Suprapubic catheter (HCC)    Normocytic anemia Likely combination between iron  deficiency and anemia of chronic disease.  Hemoglobin 7.9.  Watch with dialysis  Chronic hepatitis C without hepatic coma (HCC) Follow-up as outpatient  Chronic pain syndrome Continue home oxycodone         Subjective: Patient had some shortness of breath last night.  Her breathing little bit better this morning.  Started on dialysis yesterday.  Physical Exam: Vitals:   05/24/24 0430 05/24/24 0819 05/24/24 1032 05/24/24 1206  BP: 129/78 (!) 140/76 (!) 140/76 137/70  Pulse: 60 (!) 35 (!) 58 (!) 44  Resp: 16 18  18   Temp: 98.4 F (36.9 C) 98.3 F (36.8 C)  98.4 F (36.9 C)  TempSrc:      SpO2: 91% 93%  92%  Weight:       Physical Exam HENT:     Head: Normocephalic.  Eyes:     General: Lids are normal.     Conjunctiva/sclera: Conjunctivae normal.  Cardiovascular:     Rate and Rhythm:  Normal rate and regular rhythm.     Heart sounds: Normal heart sounds, S1 normal and S2 normal.  Pulmonary:     Breath sounds: Examination of the right-lower field reveals decreased breath sounds. Examination of the left-lower field reveals decreased breath sounds. Decreased breath sounds present. No wheezing, rhonchi or rales.  Abdominal:     Palpations: Abdomen is soft.     Tenderness: There is no abdominal tenderness.  Musculoskeletal:     Right Lower Extremity: Right leg is amputated below knee.     Left Lower Extremity: Left leg is amputated below knee.  Skin:    General: Skin is warm.      Findings: No rash.  Neurological:     Mental Status: He is alert and oriented to person, place, and time.     Data Reviewed: Last EF 55%, creatinine 4.07, hemoglobin 7.9, platelet count 128  Disposition: Status is: Inpatient Remains inpatient appropriate because: Will need 2 more dialysis sessions and outpatient dialysis slot  Planned Discharge Destination: Long-term care    Time spent: 28 minutes  Author: Charlie Patterson, MD 05/24/2024 1:18 PM  For on call review [email or web address].

## 2024-05-25 ENCOUNTER — Inpatient Hospital Stay

## 2024-05-25 DIAGNOSIS — D696 Thrombocytopenia, unspecified: Secondary | ICD-10-CM

## 2024-05-25 DIAGNOSIS — E875 Hyperkalemia: Secondary | ICD-10-CM | POA: Diagnosis not present

## 2024-05-25 DIAGNOSIS — N186 End stage renal disease: Secondary | ICD-10-CM

## 2024-05-25 DIAGNOSIS — I471 Supraventricular tachycardia, unspecified: Secondary | ICD-10-CM

## 2024-05-25 DIAGNOSIS — R0902 Hypoxemia: Principal | ICD-10-CM

## 2024-05-25 DIAGNOSIS — J9601 Acute respiratory failure with hypoxia: Secondary | ICD-10-CM | POA: Diagnosis not present

## 2024-05-25 LAB — BASIC METABOLIC PANEL WITH GFR
Anion gap: 7 (ref 5–15)
BUN: 42 mg/dL — ABNORMAL HIGH (ref 8–23)
CO2: 23 mmol/L (ref 22–32)
Calcium: 6.9 mg/dL — ABNORMAL LOW (ref 8.9–10.3)
Chloride: 107 mmol/L (ref 98–111)
Creatinine, Ser: 3.88 mg/dL — ABNORMAL HIGH (ref 0.61–1.24)
GFR, Estimated: 17 mL/min — ABNORMAL LOW (ref >60)
Glucose, Bld: 182 mg/dL — ABNORMAL HIGH (ref 70–99)
Potassium: 5 mmol/L (ref 3.5–5.1)
Sodium: 137 mmol/L (ref 135–145)

## 2024-05-25 LAB — CBC
HCT: 26.8 % — ABNORMAL LOW (ref 39.0–52.0)
Hemoglobin: 8.2 g/dL — ABNORMAL LOW (ref 13.0–17.0)
MCH: 29.1 pg (ref 26.0–34.0)
MCHC: 30.6 g/dL (ref 30.0–36.0)
MCV: 95 fL (ref 80.0–100.0)
Platelets: 133 K/uL — ABNORMAL LOW (ref 150–400)
RBC: 2.82 MIL/uL — ABNORMAL LOW (ref 4.22–5.81)
RDW: 12.9 % (ref 11.5–15.5)
WBC: 3.7 K/uL — ABNORMAL LOW (ref 4.0–10.5)
nRBC: 0 % (ref 0.0–0.2)

## 2024-05-25 LAB — GLUCOSE, CAPILLARY
Glucose-Capillary: 132 mg/dL — ABNORMAL HIGH (ref 70–99)
Glucose-Capillary: 155 mg/dL — ABNORMAL HIGH (ref 70–99)
Glucose-Capillary: 162 mg/dL — ABNORMAL HIGH (ref 70–99)
Glucose-Capillary: 187 mg/dL — ABNORMAL HIGH (ref 70–99)
Glucose-Capillary: 187 mg/dL — ABNORMAL HIGH (ref 70–99)
Glucose-Capillary: 203 mg/dL — ABNORMAL HIGH (ref 70–99)

## 2024-05-25 MED ORDER — METOPROLOL TARTRATE 5 MG/5ML IV SOLN
5.0000 mg | INTRAVENOUS | Status: DC | PRN
  Administered 2024-05-25 – 2024-05-26 (×3): 5 mg via INTRAVENOUS
  Filled 2024-05-25 (×3): qty 5

## 2024-05-25 MED ORDER — HEPARIN SODIUM (PORCINE) 1000 UNIT/ML IJ SOLN
INTRAMUSCULAR | Status: AC
  Filled 2024-05-25: qty 5

## 2024-05-25 MED ORDER — ALPRAZOLAM 0.5 MG PO TABS
0.5000 mg | ORAL_TABLET | Freq: Two times a day (BID) | ORAL | Status: DC | PRN
  Administered 2024-05-25: 0.5 mg via ORAL
  Filled 2024-05-25: qty 1

## 2024-05-25 MED ORDER — AZITHROMYCIN 250 MG PO TABS
500.0000 mg | ORAL_TABLET | Freq: Every day | ORAL | Status: AC
  Administered 2024-05-25: 500 mg via ORAL
  Filled 2024-05-25: qty 2

## 2024-05-25 MED ORDER — EPOETIN ALFA-EPBX 10000 UNIT/ML IJ SOLN
INTRAMUSCULAR | Status: AC
Start: 2024-05-25 — End: 2024-05-25
  Filled 2024-05-25: qty 1

## 2024-05-25 MED ORDER — PIPERACILLIN-TAZOBACTAM IN DEX 2-0.25 GM/50ML IV SOLN
2.2500 g | Freq: Three times a day (TID) | INTRAVENOUS | Status: DC
  Administered 2024-05-25 – 2024-05-28 (×9): 2.25 g via INTRAVENOUS
  Filled 2024-05-25 (×11): qty 50

## 2024-05-25 MED ORDER — CARVEDILOL 6.25 MG PO TABS
3.1250 mg | ORAL_TABLET | Freq: Two times a day (BID) | ORAL | Status: DC
  Administered 2024-05-25 – 2024-05-26 (×3): 3.125 mg via ORAL
  Filled 2024-05-25 (×2): qty 1
  Filled 2024-05-25: qty 0.5
  Filled 2024-05-25: qty 1

## 2024-05-25 MED ORDER — AZITHROMYCIN 250 MG PO TABS
250.0000 mg | ORAL_TABLET | Freq: Every day | ORAL | Status: DC
  Administered 2024-05-26 – 2024-05-28 (×3): 250 mg via ORAL
  Filled 2024-05-25 (×3): qty 1

## 2024-05-25 MED ORDER — DILTIAZEM HCL ER COATED BEADS 120 MG PO CP24
120.0000 mg | ORAL_CAPSULE | Freq: Every evening | ORAL | Status: DC
  Administered 2024-05-25: 120 mg via ORAL
  Filled 2024-05-25: qty 1

## 2024-05-25 MED ORDER — OXYCODONE HCL 5 MG PO TABS
ORAL_TABLET | ORAL | Status: AC
  Filled 2024-05-25: qty 1

## 2024-05-25 MED ORDER — IPRATROPIUM-ALBUTEROL 0.5-2.5 (3) MG/3ML IN SOLN
3.0000 mL | Freq: Four times a day (QID) | RESPIRATORY_TRACT | Status: DC
  Administered 2024-05-25 (×2): 3 mL via RESPIRATORY_TRACT
  Filled 2024-05-25 (×2): qty 3

## 2024-05-25 NOTE — Progress Notes (Signed)
 Progress Note   Patient: Eugene [name] FMW:[id number] DOB: [birth year] DOA: 05/21/2024     3 DOS: the patient was seen and examined on 05/25/2024   Brief hospital course: 63 y.o. M with hx CKD V baseline Cr 4.7 not yet on HD, MO, DM, bilateral BKA, hx hep C, remote history epidural abscess/vertebral osteo, now with neurogenic bladder s/p SP catheter, recent ileus, last admitted 8/7 to 8/23 for facial swelling and respiratory failure, diuresed and Nephrology consulted, treated medically.  Discharged 4 days ago, on new O2.  Since discharge, has had dyspnea, waxing and waning, worse with lying flat.  Today, after eating, his breathing got much worse, he needed more oxygen to maintain O2 sats and they told him he needed to come to the hospital for HD.    In the ER, K 5.3, BUN 49, Cr 4.9 stable from last discharge.  However, short of breath, wheezing and requiring back up to 9L HFNC.   BNP 450, WBC normal, Hgb 8.7, K 5.3, Cr 4.9 lact normal, Cr 24  Troponin with a flat curve, CXR old left effusion   8/29: Vital stable, on 7 L of oxygen.  Nephrology was consulted and HD catheter placement was ordered, patient will be started on dialysis afterwards. 8/30.  Patient seen while on dialysis.  Was on 5 L of oxygen.  Feels a little bit better. 8/31.  Patient on 4 L high flow nasal cannula this morning.  Hemoglobin 7.9, potassium 5.0 9/1.  SVT with dialysis.  IV metoprolol  ordered.  Patient taken down for dialysis before toprol  given.  Will change toprol  to low dose coreg .  Assessment and Plan: * SVT (supraventricular tachycardia) (HCC) While on dialysis.  IV metoprolol  given with good response with heart rate.  Continue to monitor.  Will change Toprol -XL over to Coreg .  ESRD (end stage renal disease) (HCC) AKI on CKD stage IV now progressed to end-stage renal disease.  PermCath placed on 8/29.  First dialysis on 8/30.  Second dialysis on 9/1.  Will still need outpatient dialysis slot and need to make sure  he is able to tolerate dialysis without going into SVT.  Acute respiratory failure with hypoxia (HCC) On admission requiring 9L and symptomatically dyspnea and tachypneic.  Today on 4 L high flow nasal cannula.  See if we can taper off oxygen.  Hyperkalemia Improved down to 5.0 with dialysis  Acute on chronic diastolic CHF (congestive heart failure) (HCC) Due to fluid overload from renal failure.  Echo in July showed EF 55-60% - IV Lasix  120 mg twice a day and dialysis.  Unable to pull off much fluid today secondary to SVT.  Thrombocytopenia (HCC) Continue to monitor platelet count  Class 3 obesity BMI 54.38, complicates care  Hypothyroidism Continue levothyroxine   Essential hypertension - IV Lasix  - Continue metoprolol   Type 2 diabetes mellitus with peripheral neuropathy (HCC) Last A1c 6.0, on Tradjenta   S/P bilateral BKA (below knee amputation) (HCC)    Suprapubic catheter (HCC)    Normocytic anemia Likely combination between iron  deficiency and anemia of chronic disease.  Hemoglobin 8.2.  Watch with dialysis  Chronic hepatitis C without hepatic coma (HCC) Follow-up as outpatient  Chronic pain syndrome Continue home oxycodone         Subjective: Patient still has some shortness of breath.  Still on 4 L high flow nasal cannula.  Heart rate up into the 140s with dialysis SVT on the monitor.  IV metoprolol  given and heart rate came down into the 80s.  Physical Exam: Vitals:   05/25/24 0900 05/25/24 0928 05/25/24 0930 05/25/24 1000  BP: (!) 164/86 (!) 139/94 (!) 139/94 (!) 161/100  Pulse: 78 (!) 134 (!) 136 (!) 145  Resp: 19 16 (!) 24 17  Temp:      TempSrc:      SpO2: 97% 99% 99% 94%  Weight:       Physical Exam HENT:     Head: Normocephalic.  Eyes:     General: Lids are normal.     Conjunctiva/sclera: Conjunctivae normal.  Cardiovascular:     Rate and Rhythm: Normal rate and regular rhythm.     Heart sounds: Normal heart sounds, S1 normal and S2  normal.  Pulmonary:     Breath sounds: Examination of the right-lower field reveals decreased breath sounds. Examination of the left-lower field reveals decreased breath sounds. Decreased breath sounds present. No wheezing, rhonchi or rales.  Abdominal:     Palpations: Abdomen is soft.     Tenderness: There is no abdominal tenderness.  Musculoskeletal:     Right Lower Extremity: Right leg is amputated below knee.     Left Lower Extremity: Left leg is amputated below knee.  Skin:    General: Skin is warm.     Findings: No rash.  Neurological:     Mental Status: He is alert and oriented to person, place, and time.     Data Reviewed: Creatinine 3.88, potassium 5.0, white blood count 3.7, platelet count 133, hemoglobin 8.2  Disposition: Status is: Inpatient Remains inpatient appropriate because: Second dialysis session and patient went into SVT.  Need to make sure he is able to tolerate dialysis without going into SVT.  Planned Discharge Destination: Long-term care    Time spent: 30 minutes Case discussed with nephrology and dialysis nurse  Author: Charlie Patterson, MD 05/25/2024 10:43 AM  For on call review [email or web address].

## 2024-05-25 NOTE — Progress Notes (Addendum)
 [facility]  ROUNDING NOTE   Subjective:   Eugene [name]  is a 63 y.o.  male  with past medical conditions including diabetes, bilateral BKA, suprapubic catheter, and hepatitis C. Patient presents to ED from SNF with shortness of breath and has been admitted for Bronchospasm [J98.01] Hypoxia [R09.02] ESRD (end stage renal disease) (HCC) [N18.6]  Patient is known to our practice from recent admission.   Update: Patient seen and evaluated during dialysis   HEMODIALYSIS FLOWSHEET:  Blood Flow Rate (mL/min): 249 mL/min Arterial Pressure (mmHg): -84.44 mmHg Venous Pressure (mmHg): 67.07 mmHg TMP (mmHg): 12.73 mmHg Ultrafiltration Rate (mL/min): 0 mL/min Dialysate Flow Rate (mL/min): 300 ml/min  Tolerating treatment well  UF held due to tachycardia    08/31 0701 - 09/01 0700 In: 1200 [P.O.:1200] Out: 2400 [Urine:2400]  Lab Results  Component Value Date   CREATININE 3.88 (H) 05/25/2024   CREATININE 4.07 (H) 05/24/2024   CREATININE 4.78 (H) 05/23/2024     Objective:  Vital signs in last 24 hours:  Temp:  [98.1 F (36.7 C)-99.2 F (37.3 C)] 99.2 F (37.3 C) (09/01 0759) Pulse Rate:  [39-136] 136 (09/01 0930) Resp:  [16-24] 24 (09/01 0930) BP: (137-199)/(70-94) 139/94 (09/01 0930) SpO2:  [92 %-99 %] 99 % (09/01 0930) Weight:  [126.3 kg-128.4 kg] 126.3 kg (09/01 0805)  Weight change: 0.3 kg Filed Weights   05/23/24 1207 05/25/24 0500 05/25/24 0805  Weight: 127.8 kg 128.4 kg 126.3 kg    Intake/Output: I/O last 3 completed shifts: In: 1200 [P.O.:1200] Out: 3350 [Urine:3350]   Intake/Output this shift:  No intake/output data recorded.  Physical Exam: General: NAD, laying in bed  Head: Normocephalic  Eyes: Anicteric  Lungs:  Diminished, 7L HFNC  Heart: Regular rate   Abdomen:  Distended and firm, obese  Extremities: 1-2+ peripheral edema.,  Bilateral BKA  Neurologic: Awake and alert  Skin: No rashes  Access: Right IJ PermCath  Suprapubic  catheter in place  Basic Metabolic Panel: Recent Labs  Lab 05/21/24 2323 05/22/24 1718 05/23/24 0819 05/24/24 0642 05/25/24 0455  NA 139 140 138 138 137  K 5.3* 5.6* 5.7* 5.0 5.0  CL 106 107 106 107 107  CO2 23 24 23 24 23   GLUCOSE 166* 175* 141* 167* 182*  BUN 49* 51* 52* 45* 42*  CREATININE 4.91* 5.03* 4.78* 4.07* 3.88*  CALCIUM  7.4* 7.4* 7.2* 7.0* 6.9*  PHOS  --   --  6.3*  --   --     Liver Function Tests: Recent Labs  Lab 05/21/24 2323 05/23/24 0819  AST 16  --   ALT 14  --   ALKPHOS 49  --   BILITOT 0.4  --   PROT 5.6*  --   ALBUMIN  1.8* 1.7*   No results for input(s): LIPASE, AMYLASE in the last 168 hours.  No results for input(s): AMMONIA in the last 168 hours.  CBC: Recent Labs  Lab 05/21/24 2323 05/22/24 1718 05/23/24 0819 05/24/24 0642 05/25/24 0455  WBC 6.0 3.8* 6.6 5.2 3.7*  NEUTROABS 3.8  --   --   --   --   HGB 8.7* 7.9* 7.8* 7.9* 8.2*  HCT 27.9* 25.0* 25.5* 26.1* 26.8*  MCV 94.9 93.6 95.5 95.6 95.0  PLT 218 218 220 148* 133*    Cardiac Enzymes: No results for input(s): CKTOTAL, CKMB, CKMBINDEX, TROPONINI in the last 168 hours.  BNP: Invalid input(s): POCBNP  CBG: Recent Labs  Lab 05/24/24 1620 05/24/24 2009 05/25/24 0001 05/25/24 0428  05/25/24 0741  GLUCAP 137* 116* 132* 187* 155*    Microbiology: Results for orders placed or performed during the hospital encounter of 05/21/24  Blood culture (routine x 2)     Status: None (Preliminary result)   Collection Time: 05/21/24 11:23 PM   Specimen: BLOOD  Result Value Ref Range Status   Specimen Description BLOOD RIGHT ANTECUBITAL  Final   Special Requests   Final    BOTTLES DRAWN AEROBIC AND ANAEROBIC Blood Culture adequate volume   Culture   Final    NO GROWTH 3 DAYS Performed at [facility], [address] [place], [place] [address]    Report Status PENDING  Incomplete  Blood culture (routine x 2)     Status: None (Preliminary result)    Collection Time: 05/21/24 11:23 PM   Specimen: BLOOD  Result Value Ref Range Status   Specimen Description BLOOD LEFT ANTECUBITAL  Final   Special Requests   Final    BOTTLES DRAWN AEROBIC AND ANAEROBIC Blood Culture results may not be optimal due to an inadequate volume of blood received in culture bottles   Culture   Final    NO GROWTH 3 DAYS Performed at [facility], [address] [place], [place] [address]    Report Status PENDING  Incomplete    Coagulation Studies: No results for input(s): LABPROT, INR in the last 72 hours.  Urinalysis: No results for input(s): COLORURINE, LABSPEC, PHURINE, GLUCOSEU, HGBUR, BILIRUBINUR, KETONESUR, PROTEINUR, UROBILINOGEN, NITRITE, LEUKOCYTESUR in the last 72 hours.  Invalid input(s): APPERANCEUR      Imaging: No results found.     Medications:        bisacodyl   10 mg Oral QHS   Chlorhexidine  Gluconate Cloth  6 each Topical Q0600   epoetin  alfa-epbx (RETACRIT ) injection  10,000 Units Intravenous Once   famotidine   10 mg Oral Daily   furosemide   80 mg Intravenous BID   And   furosemide   40 mg Intravenous BID   heparin   5,000 Units Subcutaneous Q8H   insulin  aspart  0-9 Units Subcutaneous Q4H   lactulose   20 g Oral BID   linaclotide   145 mcg Oral QAC breakfast   linagliptin   5 mg Oral Daily   melatonin  10 mg Oral QHS   metoprolol  succinate  25 mg Oral Daily   patiromer   16.8 g Oral Daily   polyethylene glycol  17 g Oral BID   senna-docusate  1 tablet Oral BID   acetaminophen  **OR** acetaminophen , bisacodyl , ondansetron  **OR** ondansetron  (ZOFRAN ) IV, oxyCODONE   Assessment/ Plan:  [name] is a 63 y.o.  male  with past medical conditions including diabetes, bilateral BKA, suprapubic catheter, hepatitis C, who was admitted to [facility] on 05/21/2024 for Bronchospasm [J98.01] Hypoxia [R09.02] ESRD (end stage renal disease) (HCC) [N18.6]    End stage renal disease requiring  hemodialysis. On chronic kidney disease stage IV, creatinine 3.74, GFR of 18 on 04/30/24.  Patient has had multiple recent admissions where he has  remained borderline but stable with renal function. It has been discussed with patient during recent admissions that he will likely require dialysis in the near future. Patient understands that due to current symptoms and lack of effective response to medications, we feel it is time to pursue dialysis.  We appreciate vascular surgery placing right IJ PermCath on 05/22/2024.   Receiving dialysis today, UF goal 1L, UF held due to tachycardia. HD LPN to give 200ml bolus.  Next treatment scheduled for Tuesday,  patient notified he will need to sit in a chair for this treatment.   Dialysis navigator notified of need of outpatient dialysis clinic.  Patient will need to sit in chair for dialysis prior to discharging.  Lab Results  Component Value Date   CREATININE 3.88 (H) 05/25/2024   CREATININE 4.07 (H) 05/24/2024   CREATININE 4.78 (H) 05/23/2024     Intake/Output Summary (Last 24 hours) at 05/25/2024 0957 Last data filed at 05/25/2024 9378 Gross per 24 hour  Intake 720 ml  Output 2400 ml  Net -1680 ml    2. Acute respiratory failure requiring increased oxygen. Chest xray negative for edema.  Weaned to 4 L HFNC, but increased to 7L overnight at patient request.      3.  Hypertension with chronic kidney disease.  Currently receiving IV furosemide  120mg  twice daily and metoprolol .  Blood pressure 164/86 during dialysis.    4.  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 Urine protein to creatinine ratio of 15.4 from previous admission.  Proteinuria likely secondary to diabetic nephropathy.  Hemoglobin A1c of 6.0% from 04/14/2024.  Glucose well controlled.  Primary team will continue management of SSI  5. Anemia of chronic kidney disease Lab Results  Component Value Date   HGB 8.2 (L) 05/25/2024     Hemoglobin 8.2.  Previous iron  studies acceptable.  Blood pressure elevated, will hold EPO today   LOS: 3 Yamilee Harmes 9/1/20259:57 AM

## 2024-05-25 NOTE — Assessment & Plan Note (Signed)
 AKI on CKD stage IV now progressed to end-stage renal disease.  PermCath placed on 8/29.  First dialysis on 8/30.  Second dialysis on 9/1.  Will still need outpatient dialysis slot and need to make sure he is able to tolerate dialysis without going into SVT.

## 2024-05-25 NOTE — Progress Notes (Signed)
 Ubaldo Fordyce nurse from nursing facility call for update on patient. Report given.

## 2024-05-25 NOTE — Progress Notes (Signed)
  Received patient in bed to unit.   Informed consent signed and in chart.    TX duration:2:30 hrs     Transported by  Hand-off given to patient's nurse.    Access used: R internal jugular  Access issues: none   Total UF removed: no removal Medication(s) given: Epo, Metoprolol , Carvedilol   Post HD VS: wnl      GEANNIE Sar LPN [facility]

## 2024-05-25 NOTE — Assessment & Plan Note (Signed)
 While on dialysis.  IV metoprolol  given with good response with heart rate.  Continue to monitor.  Will change Toprol -XL over to Coreg .

## 2024-05-26 ENCOUNTER — Encounter: Payer: Self-pay | Admitting: Vascular Surgery

## 2024-05-26 ENCOUNTER — Inpatient Hospital Stay

## 2024-05-26 DIAGNOSIS — N186 End stage renal disease: Secondary | ICD-10-CM | POA: Diagnosis not present

## 2024-05-26 DIAGNOSIS — I471 Supraventricular tachycardia, unspecified: Secondary | ICD-10-CM | POA: Diagnosis not present

## 2024-05-26 DIAGNOSIS — E875 Hyperkalemia: Secondary | ICD-10-CM | POA: Diagnosis not present

## 2024-05-26 DIAGNOSIS — J9601 Acute respiratory failure with hypoxia: Secondary | ICD-10-CM | POA: Diagnosis not present

## 2024-05-26 LAB — CBC WITH DIFFERENTIAL/PLATELET
Abs Immature Granulocytes: 0.07 K/uL (ref 0.00–0.07)
Basophils Absolute: 0 K/uL (ref 0.0–0.1)
Basophils Relative: 1 %
Eosinophils Absolute: 0.1 K/uL (ref 0.0–0.5)
Eosinophils Relative: 3 %
HCT: 25.7 % — ABNORMAL LOW (ref 39.0–52.0)
Hemoglobin: 8.1 g/dL — ABNORMAL LOW (ref 13.0–17.0)
Immature Granulocytes: 2 %
Lymphocytes Relative: 29 %
Lymphs Abs: 1.3 K/uL (ref 0.7–4.0)
MCH: 29 pg (ref 26.0–34.0)
MCHC: 31.5 g/dL (ref 30.0–36.0)
MCV: 92.1 fL (ref 80.0–100.0)
Monocytes Absolute: 0.5 K/uL (ref 0.1–1.0)
Monocytes Relative: 12 %
Neutro Abs: 2.4 K/uL (ref 1.7–7.7)
Neutrophils Relative %: 53 %
Platelets: 174 K/uL (ref 150–400)
RBC: 2.79 MIL/uL — ABNORMAL LOW (ref 4.22–5.81)
RDW: 13 % (ref 11.5–15.5)
WBC: 4.4 K/uL (ref 4.0–10.5)
nRBC: 0 % (ref 0.0–0.2)

## 2024-05-26 LAB — RENAL FUNCTION PANEL
Albumin: 1.7 g/dL — ABNORMAL LOW (ref 3.5–5.0)
Anion gap: 10 (ref 5–15)
BUN: 28 mg/dL — ABNORMAL HIGH (ref 8–23)
CO2: 28 mmol/L (ref 22–32)
Calcium: 7.1 mg/dL — ABNORMAL LOW (ref 8.9–10.3)
Chloride: 102 mmol/L (ref 98–111)
Creatinine, Ser: 3.04 mg/dL — ABNORMAL HIGH (ref 0.61–1.24)
GFR, Estimated: 23 mL/min — ABNORMAL LOW (ref >60)
Glucose, Bld: 156 mg/dL — ABNORMAL HIGH (ref 70–99)
Phosphorus: 3.3 mg/dL (ref 2.5–4.6)
Potassium: 4.2 mmol/L (ref 3.5–5.1)
Sodium: 140 mmol/L (ref 135–145)

## 2024-05-26 LAB — GLUCOSE, CAPILLARY
Glucose-Capillary: 171 mg/dL — ABNORMAL HIGH (ref 70–99)
Glucose-Capillary: 227 mg/dL — ABNORMAL HIGH (ref 70–99)
Glucose-Capillary: 171 mg/dL — ABNORMAL HIGH (ref 70–99)

## 2024-05-26 MED ORDER — EPOETIN ALFA-EPBX 10000 UNIT/ML IJ SOLN
INTRAMUSCULAR | Status: AC
  Filled 2024-05-26: qty 1

## 2024-05-26 MED ORDER — DILTIAZEM HCL ER COATED BEADS 120 MG PO CP24
240.0000 mg | ORAL_CAPSULE | Freq: Every evening | ORAL | Status: DC
  Administered 2024-05-26 – 2024-05-27 (×2): 240 mg via ORAL
  Filled 2024-05-26 (×2): qty 2

## 2024-05-26 MED ORDER — IPRATROPIUM-ALBUTEROL 0.5-2.5 (3) MG/3ML IN SOLN
3.0000 mL | Freq: Three times a day (TID) | RESPIRATORY_TRACT | Status: DC
  Administered 2024-05-26 – 2024-05-28 (×6): 3 mL via RESPIRATORY_TRACT
  Filled 2024-05-26 (×7): qty 3

## 2024-05-26 MED ORDER — ALTEPLASE 2 MG IJ SOLR: 2.0000 mg | Freq: Once | INTRAMUSCULAR | Status: DC | PRN

## 2024-05-26 MED ORDER — CARVEDILOL 6.25 MG PO TABS
6.2500 mg | ORAL_TABLET | Freq: Two times a day (BID) | ORAL | Status: DC
  Administered 2024-05-26 – 2024-05-27 (×3): 6.25 mg via ORAL
  Filled 2024-05-26 (×3): qty 1

## 2024-05-26 MED ORDER — EPOETIN ALFA-EPBX 10000 UNIT/ML IJ SOLN
10000.0000 [IU] | INTRAMUSCULAR | Status: DC
  Administered 2024-05-26: 10000 [IU] via INTRAVENOUS
  Filled 2024-05-26: qty 2

## 2024-05-26 MED ORDER — HEPARIN SODIUM (PORCINE) 1000 UNIT/ML IJ SOLN
INTRAMUSCULAR | Status: AC
  Filled 2024-05-26: qty 6

## 2024-05-26 MED ORDER — HEPARIN SODIUM (PORCINE) 1000 UNIT/ML DIALYSIS
1000.0000 [IU] | INTRAMUSCULAR | Status: DC | PRN
  Administered 2024-05-26: 1000 [IU]

## 2024-05-26 NOTE — Progress Notes (Addendum)
 Progress Note   Patient: Eugene [name] FMW:[id number] DOB: Feb 07, 1961 DOA: 05/21/2024     4 DOS: the patient was seen and examined on 05/26/2024   Brief hospital course: 63 y.o. M with hx CKD V baseline Cr 4.7 not yet on HD, MO, DM, bilateral BKA, hx hep C, remote history epidural abscess/vertebral osteo, now with neurogenic bladder s/p SP catheter, recent ileus, last admitted 8/7 to 8/23 for facial swelling and respiratory failure, diuresed and Nephrology consulted, treated medically.  Discharged 4 days ago, on new O2.  Since discharge, has had dyspnea, waxing and waning, worse with lying flat.  Today, after eating, his breathing got much worse, he needed more oxygen to maintain O2 sats and they told him he needed to come to the hospital for HD.    In the ER, K 5.3, BUN 49, Cr 4.9 stable from last discharge.  However, short of breath, wheezing and requiring back up to 9L HFNC.   BNP 450, WBC normal, Hgb 8.7, K 5.3, Cr 4.9 lact normal, Cr 24  Troponin with a flat curve, CXR old left effusion   8/29: Vital stable, on 7 L of oxygen.  Nephrology was consulted and HD catheter placement was ordered, patient will be started on dialysis afterwards. 8/30.  Patient seen while on dialysis.  Was on 5 L of oxygen.  Feels a little bit better. 8/31.  Patient on 4 L high flow nasal cannula this morning.  Hemoglobin 7.9, potassium 5.0 9/1.  SVT with dialysis.  IV metoprolol  ordered.  Patient taken down for dialysis before toprol  given.  Will change toprol  to low dose coreg . 9/2.  SVT again with dialysis.  Increased dose of Coreg  and Cardizem  CD.  Unable to get a CT scan of the chest since patient is new to dialysis.  VQ scan also would not be a good test.  Will get a sonogram of the thighs to rule out DVT.  Assessment and Plan: * SVT (supraventricular tachycardia) (HCC) While on dialysis 2 days in a row.  As needed IV metoprolol .  Continue to monitor.  Increase dose of Coreg  and Cardizem  CD.  Unable to get a  CT scan of the chest secondary to patient being new to dialysis.  VQ scan would not be a good test secondary to hypoxia and fluid overload.  Will start out with sonogram of his thighs.  ESRD (end stage renal disease) (HCC) AKI on CKD stage IV now progressed to end-stage renal disease.  PermCath placed on 8/29.  First dialysis on 8/30.  Second dialysis on 9/1.  Third dialysis on 9/2.  Will need an outpatient dialysis slot.  Acute respiratory failure with hypoxia (HCC) On admission requiring 9L and symptomatically dyspnea and tachypneic.  Today on 7 L high flow nasal cannula.  Will need to have better oxygenation prior to disposition.  Started on empiric antibiotics on 9/1 for possibility of pneumonia.  CT scan of the chest for further evaluation.  Hyperkalemia Improved with dialysis treatments  Acute on chronic diastolic CHF (congestive heart failure) (HCC) Due to fluid overload from renal failure.  Echo in July showed EF 55-60% - IV Lasix  120 mg twice a day and dialysis.    Thrombocytopenia (HCC) Platelet count in normal range today  Class 3 obesity BMI 55.2, complicates care  Hypothyroidism Continue levothyroxine   Essential hypertension - IV Lasix  - Continue metoprolol   Type 2 diabetes mellitus with peripheral neuropathy (HCC) Last A1c 6.0, on Tradjenta   S/P bilateral BKA (below knee amputation) (  HCC)    Suprapubic catheter (HCC)    Normocytic anemia Likely combination between iron  deficiency and anemia of chronic disease.  Hemoglobin 8.1.  Watch with dialysis  Chronic hepatitis C without hepatic coma (HCC) Follow-up as outpatient  Chronic pain syndrome Continue home oxycodone         Subjective: Patient again with episodes of SVT during dialysis.  Will increase Coreg  and Cardizem  CD.  Will get a CAT scan of the chest to rule out PE.  Still on high flow nasal cannula.  Physical Exam: Vitals:   05/26/24 1125 05/26/24 1130 05/26/24 1210 05/26/24 1339  BP: 118/81   (!) 129/92   Pulse: (!) 129 (!) 129 (!) 130   Resp: (!) 21 (!) 21    Temp: 98.7 F (37.1 C)  98.7 F (37.1 C)   TempSrc: Oral  Oral   SpO2: 97% 93% 96% 98%  Weight:       Physical Exam HENT:     Head: Normocephalic.  Eyes:     General: Lids are normal.     Conjunctiva/sclera: Conjunctivae normal.  Cardiovascular:     Rate and Rhythm: Normal rate and regular rhythm.     Heart sounds: Normal heart sounds, S1 normal and S2 normal.  Pulmonary:     Breath sounds: Examination of the right-lower field reveals decreased breath sounds. Examination of the left-lower field reveals decreased breath sounds. Decreased breath sounds present. No wheezing, rhonchi or rales.  Abdominal:     Palpations: Abdomen is soft.     Tenderness: There is no abdominal tenderness.  Musculoskeletal:     Right Lower Extremity: Right leg is amputated below knee.     Left Lower Extremity: Left leg is amputated below knee.  Skin:    General: Skin is warm.     Findings: No rash.  Neurological:     Mental Status: He is alert and oriented to person, place, and time.     Data Reviewed: Creatinine 3.04, albumin  1.7, hemoglobin 8.1, white blood count 4.4, platelet count 174 Family Communication: Updated brother on the phone  Disposition: Status is: Inpatient Remains inpatient appropriate because: With repeated episodes of SVT during dialysis I will increase Cardizem  and Coreg , get a CT scan of the chest to rule out PE.  Will need to get respiratory status better prior to disposition.  Planned Discharge Destination: Long-term care    Time spent: 28 minutes  Author: Charlie Patterson, MD 05/26/2024 2:04 PM  For on call review [email or web address].

## 2024-05-26 NOTE — Progress Notes (Addendum)
 [facility]  ROUNDING NOTE   Subjective:   Eugene [name]  is a 63 y.o.  male  with past medical conditions including diabetes, bilateral BKA, suprapubic catheter, and hepatitis C. Patient presents to ED from SNF with shortness of breath and has been admitted for Bronchospasm [J98.01] Hypoxia [R09.02] ESRD (end stage renal disease) (HCC) [N18.6]  Patient is known to our practice from recent admission.   Update:  Patient seen and evaluated during dialysis   HEMODIALYSIS FLOWSHEET:  Blood Flow Rate (mL/min): 299 mL/min Arterial Pressure (mmHg): -128.27 mmHg Venous Pressure (mmHg): 112.52 mmHg TMP (mmHg): 6.26 mmHg Ultrafiltration Rate (mL/min): 600 mL/min Dialysate Flow Rate (mL/min): 299 ml/min Bolus Amount (mL): 100 mL  Grumpy today Tolerating treatment fair HR increased to 140's once, HD RN states it resolved with apple just and breathing exercises   09/01 0701 - 09/02 0700 In: 760 [P.O.:660; IV Piggyback:100] Out: 2297.5 [Urine:2250]  Lab Results  Component Value Date   CREATININE 3.04 (H) 05/26/2024   CREATININE 3.88 (H) 05/25/2024   CREATININE 4.07 (H) 05/24/2024     Objective:  Vital signs in last 24 hours:  Temp:  [98.4 F (36.9 C)-100.1 F (37.8 C)] 98.5 F (36.9 C) (09/02 0801) Pulse Rate:  [41-143] 45 (09/02 1000) Resp:  [11-29] 16 (09/02 1000) BP: (146-193)/(59-100) 160/71 (09/02 1000) SpO2:  [93 %-99 %] 95 % (09/02 1000) Weight:  [128.2 kg] 128.2 kg (09/02 0801)  Weight change: -2.1 kg Filed Weights   05/25/24 0805 05/26/24 0353 05/26/24 0801  Weight: 126.3 kg 128.2 kg 128.2 kg    Intake/Output: I/O last 3 completed shifts: In: 760 [P.O.:660; IV Piggyback:100] Out: 4697.5 [Urine:4650; Other:47.5]   Intake/Output this shift:  No intake/output data recorded.  Physical Exam: General: NAD, laying in bed  Head: Normocephalic  Eyes: Anicteric  Lungs:  Diminished, 5L HFNC  Heart: Regular rate   Abdomen:  Distended and firm,  obese  Extremities: 2+ peripheral edema.,  Bilateral BKA  Neurologic: Awake and alert  Skin: No rashes  Access: Right IJ PermCath  Suprapubic catheter in place  Basic Metabolic Panel: Recent Labs  Lab 05/22/24 1718 05/23/24 0819 05/24/24 0642 05/25/24 0455 05/26/24 0812  NA 140 138 138 137 140  K 5.6* 5.7* 5.0 5.0 4.2  CL 107 106 107 107 102  CO2 24 23 24 23 28   GLUCOSE 175* 141* 167* 182* 156*  BUN 51* 52* 45* 42* 28*  CREATININE 5.03* 4.78* 4.07* 3.88* 3.04*  CALCIUM  7.4* 7.2* 7.0* 6.9* 7.1*  PHOS  --  6.3*  --   --  3.3    Liver Function Tests: Recent Labs  Lab 05/21/24 2323 05/23/24 0819 05/26/24 0812  AST 16  --   --   ALT 14  --   --   ALKPHOS 49  --   --   BILITOT 0.4  --   --   PROT 5.6*  --   --   ALBUMIN  1.8* 1.7* 1.7*   No results for input(s): LIPASE, AMYLASE in the last 168 hours.  No results for input(s): AMMONIA in the last 168 hours.  CBC: Recent Labs  Lab 05/21/24 2323 05/22/24 1718 05/23/24 0819 05/24/24 0642 05/25/24 0455 05/26/24 0812  WBC 6.0 3.8* 6.6 5.2 3.7* 4.4  NEUTROABS 3.8  --   --   --   --  2.4  HGB 8.7* 7.9* 7.8* 7.9* 8.2* 8.1*  HCT 27.9* 25.0* 25.5* 26.1* 26.8* 25.7*  MCV 94.9 93.6 95.5 95.6 95.0 92.1  PLT 218 218 220 148* 133* 174    Cardiac Enzymes: No results for input(s): CKTOTAL, CKMB, CKMBINDEX, TROPONINI in the last 168 hours.  BNP: Invalid input(s): POCBNP  CBG: Recent Labs  Lab 05/25/24 0428 05/25/24 0741 05/25/24 1150 05/25/24 1640 05/25/24 1953  GLUCAP 187* 155* 162* 187* 203*    Microbiology: Results for orders placed or performed during the hospital encounter of 05/21/24  Blood culture (routine x 2)     Status: None (Preliminary result)   Collection Time: 05/21/24 11:23 PM   Specimen: BLOOD  Result Value Ref Range Status   Specimen Description BLOOD RIGHT ANTECUBITAL  Final   Special Requests   Final    BOTTLES DRAWN AEROBIC AND ANAEROBIC Blood Culture adequate volume    Culture   Final    NO GROWTH 4 DAYS Performed at [facility], [address], [place], [place] [address]    Report Status PENDING  Incomplete  Blood culture (routine x 2)     Status: None (Preliminary result)   Collection Time: 05/21/24 11:23 PM   Specimen: BLOOD  Result Value Ref Range Status   Specimen Description BLOOD LEFT ANTECUBITAL  Final   Special Requests   Final    BOTTLES DRAWN AEROBIC AND ANAEROBIC Blood Culture results may not be optimal due to an inadequate volume of blood received in culture bottles   Culture   Final    NO GROWTH 4 DAYS Performed at [facility], [address] [place], [place] [address]    Report Status PENDING  Incomplete    Coagulation Studies: No results for input(s): LABPROT, INR in the last 72 hours.  Urinalysis: No results for input(s): COLORURINE, LABSPEC, PHURINE, GLUCOSEU, HGBUR, BILIRUBINUR, KETONESUR, PROTEINUR, UROBILINOGEN, NITRITE, LEUKOCYTESUR in the last 72 hours.  Invalid input(s): APPERANCEUR      Imaging: DG Chest Port 1 View Result Date: 05/25/2024 CLINICAL DATA:  Shortness of breath. EXAM: PORTABLE CHEST 1 VIEW COMPARISON:  Radiograph 05/21/2024, most recent CT 05/11/24 FINDINGS: Right-sided dialysis catheter tip overlies the SVC. Bilateral pleural effusions, left greater than right with bibasilar opacities, appearing rounded on the left. Cardiomegaly is stable. No pulmonary edema or pneumothorax. Postsurgical change in the thoracolumbar spine. IMPRESSION: 1. New right-sided dialysis catheter. Otherwise stable radiographic appearance of the chest. Unchanged bilateral pleural effusions, left greater than right with bibasilar opacities. 2. Stable cardiomegaly. Electronically Signed   By: Andrea Gasman M.D.   On: 05/25/2024 18:35      Medications:    piperacillin -tazobactam (ZOSYN )  IV 2.25 g (05/26/24 0252)       azithromycin   250 mg Oral Daily   bisacodyl   10 mg Oral  QHS   carvedilol   3.125 mg Oral BID WC   Chlorhexidine  Gluconate Cloth  6 each Topical Q0600   diltiazem   120 mg Oral QPM   famotidine   10 mg Oral Daily   furosemide   80 mg Intravenous BID   And   furosemide   40 mg Intravenous BID   heparin   5,000 Units Subcutaneous Q8H   insulin  aspart  0-9 Units Subcutaneous Q4H   ipratropium-albuterol   3 mL Nebulization TID   lactulose   20 g Oral BID   linaclotide   145 mcg Oral QAC breakfast   linagliptin   5 mg Oral Daily   melatonin  10 mg Oral QHS   patiromer   16.8 g Oral Daily   polyethylene glycol  17 g Oral BID   senna-docusate  1 tablet Oral BID   acetaminophen  **OR**  acetaminophen , ALPRAZolam , alteplase , bisacodyl , heparin , metoprolol  tartrate, ondansetron  **OR** ondansetron  (ZOFRAN ) IV, oxyCODONE   Assessment/ Plan:  [name] is a 63 y.o.  male  with past medical conditions including diabetes, bilateral BKA, suprapubic catheter, hepatitis C, who was admitted to [facility] on 05/21/2024 for Bronchospasm [J98.01] Hypoxia [R09.02] ESRD (end stage renal disease) (HCC) [N18.6]    Acute kidney injury with proteinuria and hyperkalemia. On chronic kidney disease stage IV, creatinine 3.74, GFR of 18 on 04/30/24. Acute kidney injury likely secondary to progression of kidney disease and volume overload. Patient has had multiple recent admissions where he has  remained borderline but stable with renal function. It has been discussed with patient during recent admissions that he will likely require dialysis in the near future. Patient understands that due to current symptoms and lack of effective response to medications, we feel it is time to pursue dialysis.  We appreciate vascular surgery placing right IJ PermCath on 05/22/2024. Receiving third dialysis treatment today, UF 1L. Has experienced short episode of tachycardia during this treatment. Resolved with juice. Will continue to monitor.  Did not sit for dialysis today, Will request nursing staff place  patient in recliner for 4 hours. This will meet the outpatient requirements.  Lab Results  Component Value Date   CREATININE 3.04 (H) 05/26/2024   CREATININE 3.88 (H) 05/25/2024   CREATININE 4.07 (H) 05/24/2024     Intake/Output Summary (Last 24 hours) at 05/26/2024 1008 Last data filed at 05/26/2024 0400 Gross per 24 hour  Intake 760 ml  Output 2297.5 ml  Net -1537.5 ml    2. Acute respiratory failure requiring increased oxygen.  Weaned to 5L HFNC.  Chest xray negative for edema.     3.  Hypertension with chronic kidney disease.  Currently receiving IV furosemide  120mg  twice daily and metoprolol .  Blood pressure 138/68 during dialysis   4.  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 Urine protein to creatinine ratio of 15.4 from previous admission.  Proteinuria likely secondary to diabetic nephropathy.  Hemoglobin A1c of 6.0% from 04/14/2024.  Primary team will continue management of SSI  5. Anemia of chronic kidney disease Lab Results  Component Value Date   HGB 8.1 (L) 05/26/2024    Hemoglobin 8.1.  Previous iron  studies acceptable.  Will order Retacrit  10000 units with dialysis.    LOS: 4 Eugene [name] 9/2/202510:08 AM

## 2024-05-26 NOTE — Progress Notes (Signed)
 Post Dialysis    05/26/24 1125  Vitals  Temp 98.7 F (37.1 C)  Temp Source Oral  BP 118/81  MAP (mmHg) 94  BP Location Left Arm  BP Method Automatic  Patient Position (if appropriate) Lying  Pulse Rate (!) 129  Pulse Rate Source Monitor  ECG Heart Rate (!) 129  Resp (!) 21  Oxygen Therapy  SpO2 97 %  O2 Device HFNC  O2 Flow Rate (L/min) 5 L/min  During Treatment Monitoring  Blood Flow Rate (mL/min) 0 mL/min  Arterial Pressure (mmHg) -3.63 mmHg  Venous Pressure (mmHg) -2.22 mmHg  TMP (mmHg) -50.5 mmHg  Ultrafiltration Rate (mL/min) 504 mL/min  Dialysate Flow Rate (mL/min) 300 ml/min  Duration of HD Treatment -hour(s) 3 hour(s)  Cumulative Fluid Removed (mL) per Treatment  1000.09  Post Treatment  Dialyzer Clearance Heavily streaked  Hemodialysis Intake (mL) 0 mL  Liters Processed 54  Fluid Removed (mL) 1000 mL  Tolerated HD Treatment Yes  Post-Hemodialysis Comments Experienced asymptomatic tachycardia during treatment, prn IV metoprolol  given post rinseback.Scheduled Epogen  given with treatment. Right HD CVC access functioned well.

## 2024-05-27 ENCOUNTER — Inpatient Hospital Stay

## 2024-05-27 DIAGNOSIS — J9601 Acute respiratory failure with hypoxia: Secondary | ICD-10-CM | POA: Diagnosis not present

## 2024-05-27 DIAGNOSIS — I471 Supraventricular tachycardia, unspecified: Secondary | ICD-10-CM | POA: Diagnosis not present

## 2024-05-27 DIAGNOSIS — N186 End stage renal disease: Secondary | ICD-10-CM | POA: Diagnosis not present

## 2024-05-27 DIAGNOSIS — N179 Acute kidney failure, unspecified: Secondary | ICD-10-CM | POA: Diagnosis not present

## 2024-05-27 LAB — CBC
HCT: 26.6 % — ABNORMAL LOW (ref 39.0–52.0)
Hemoglobin: 8.5 g/dL — ABNORMAL LOW (ref 13.0–17.0)
MCH: 29.5 pg (ref 26.0–34.0)
MCHC: 32 g/dL (ref 30.0–36.0)
MCV: 92.4 fL (ref 80.0–100.0)
Platelets: 194 K/uL (ref 150–400)
RBC: 2.88 MIL/uL — ABNORMAL LOW (ref 4.22–5.81)
RDW: 13.2 % (ref 11.5–15.5)
WBC: 4.5 K/uL (ref 4.0–10.5)
nRBC: 0 % (ref 0.0–0.2)

## 2024-05-27 LAB — CULTURE, BLOOD (ROUTINE X 2)
Culture: NO GROWTH
Culture: NO GROWTH
Special Requests: ADEQUATE

## 2024-05-27 LAB — GLUCOSE, CAPILLARY
Glucose-Capillary: 154 mg/dL — ABNORMAL HIGH (ref 70–99)
Glucose-Capillary: 222 mg/dL — ABNORMAL HIGH (ref 70–99)
Glucose-Capillary: 219 mg/dL — ABNORMAL HIGH (ref 70–99)
Glucose-Capillary: 171 mg/dL — ABNORMAL HIGH (ref 70–99)
Glucose-Capillary: 203 mg/dL — ABNORMAL HIGH (ref 70–99)

## 2024-05-27 LAB — BASIC METABOLIC PANEL WITH GFR
Anion gap: 10 (ref 5–15)
BUN: 18 mg/dL (ref 8–23)
CO2: 29 mmol/L (ref 22–32)
Calcium: 7.2 mg/dL — ABNORMAL LOW (ref 8.9–10.3)
Chloride: 99 mmol/L (ref 98–111)
Creatinine, Ser: 2.68 mg/dL — ABNORMAL HIGH (ref 0.61–1.24)
GFR, Estimated: 26 mL/min — ABNORMAL LOW (ref >60)
Glucose, Bld: 165 mg/dL — ABNORMAL HIGH (ref 70–99)
Potassium: 3.5 mmol/L (ref 3.5–5.1)
Sodium: 138 mmol/L (ref 135–145)

## 2024-05-27 LAB — D-DIMER, QUANTITATIVE: D-Dimer, Quant: 9.77 ug{FEU}/mL — ABNORMAL HIGH (ref 0.00–0.50)

## 2024-05-27 MED ORDER — TECHNETIUM TO 99M ALBUMIN AGGREGATED: 4.4700 | Freq: Once | INTRAVENOUS | Status: DC | PRN

## 2024-05-27 NOTE — Progress Notes (Signed)
 Requested to see pt for outpt HD needs at d/c.  Met with pt bedside.  Introduced self and explained role. Discussed HD options.   Suzen Satchel Dialysis Navigator [email or web address]

## 2024-05-27 NOTE — Progress Notes (Signed)
 [facility]  ROUNDING NOTE   Subjective:   Eugene [name]  is a 63 y.o.  male  with past medical conditions including diabetes, bilateral BKA, suprapubic catheter, and hepatitis C. Patient presents to ED from SNF with shortness of breath and has been admitted for Bronchospasm [J98.01] Hypoxia [R09.02] ESRD (end stage renal disease) (HCC) [N18.6]  Patient is known to our practice from recent admission.   Update:  Patient seen laying in bed earlier today Nursing at bedside Patient remains on HFNC Trace lower extremity edema present   09/02 0701 - 09/03 0700 In: 120 [P.O.:120] Out: 1700 [Urine:700]  Lab Results  Component Value Date   CREATININE 2.68 (H) 05/27/2024   CREATININE 3.04 (H) 05/26/2024   CREATININE 3.88 (H) 05/25/2024     Objective:  Vital signs in last 24 hours:  Temp:  [99.7 F (37.6 C)-99.9 F (37.7 C)] 99.9 F (37.7 C) (09/02 1900) Pulse Rate:  [36-79] 36 (09/03 0821) Resp:  [16-21] 16 (09/03 0821) BP: (127-147)/(51-94) 130/51 (09/03 0821) SpO2:  [94 %-98 %] 97 % (09/03 0821)  Weight change: 1.9 kg Filed Weights   05/25/24 0805 05/26/24 0353 05/26/24 0801  Weight: 126.3 kg 128.2 kg 128.2 kg    Intake/Output: I/O last 3 completed shifts: In: 400 [P.O.:300; IV Piggyback:100] Out: 3950 [Urine:2950; Other:1000]   Intake/Output this shift:  Total I/O In: 480 [P.O.:480] Out: -   Physical Exam: General: NAD, laying in bed  Head: Normocephalic  Eyes: Anicteric  Lungs:  Diminished, 6L HFNC  Heart: Regular rate   Abdomen:  Distended and firm, obese  Extremities: Trace peripheral edema.,  Bilateral BKA  Neurologic: Awake and alert  Skin: No rashes  Access: Right IJ PermCath  Suprapubic catheter in place  Basic Metabolic Panel: Recent Labs  Lab 05/23/24 0819 05/24/24 0642 05/25/24 0455 05/26/24 0812 05/27/24 0504  NA 138 138 137 140 138  K 5.7* 5.0 5.0 4.2 3.5  CL 106 107 107 102 99  CO2 23 24 23 28 29   GLUCOSE 141*  167* 182* 156* 165*  BUN 52* 45* 42* 28* 18  CREATININE 4.78* 4.07* 3.88* 3.04* 2.68*  CALCIUM  7.2* 7.0* 6.9* 7.1* 7.2*  PHOS 6.3*  --   --  3.3  --     Liver Function Tests: Recent Labs  Lab 05/21/24 2323 05/23/24 0819 05/26/24 0812  AST 16  --   --   ALT 14  --   --   ALKPHOS 49  --   --   BILITOT 0.4  --   --   PROT 5.6*  --   --   ALBUMIN  1.8* 1.7* 1.7*   No results for input(s): LIPASE, AMYLASE in the last 168 hours.  No results for input(s): AMMONIA in the last 168 hours.  CBC: Recent Labs  Lab 05/21/24 2323 05/22/24 1718 05/23/24 0819 05/24/24 0642 05/25/24 0455 05/26/24 0812 05/27/24 0504  WBC 6.0   < > 6.6 5.2 3.7* 4.4 4.5  NEUTROABS 3.8  --   --   --   --  2.4  --   HGB 8.7*   < > 7.8* 7.9* 8.2* 8.1* 8.5*  HCT 27.9*   < > 25.5* 26.1* 26.8* 25.7* 26.6*  MCV 94.9   < > 95.5 95.6 95.0 92.1 92.4  PLT 218   < > 220 148* 133* 174 194   < > = values in this interval not displayed.    Cardiac Enzymes: No results for input(s): CKTOTAL, CKMB, CKMBINDEX,  TROPONINI in the last 168 hours.  BNP: Invalid input(s): POCBNP  CBG: Recent Labs  Lab 05/26/24 1723 05/26/24 2045 05/27/24 0502 05/27/24 0822 05/27/24 1211  GLUCAP 227* 171* 154* 222* 219*    Microbiology: Results for orders placed or performed during the hospital encounter of 05/21/24  Blood culture (routine x 2)     Status: None (Preliminary result)   Collection Time: 05/21/24 11:23 PM   Specimen: BLOOD  Result Value Ref Range Status   Specimen Description BLOOD RIGHT ANTECUBITAL  Final   Special Requests   Final    BOTTLES DRAWN AEROBIC AND ANAEROBIC Blood Culture adequate volume   Culture   Final    NO GROWTH 4 DAYS Performed at [facility], [address] [place], [place] [address]    Report Status PENDING  Incomplete  Blood culture (routine x 2)     Status: None (Preliminary result)   Collection Time: 05/21/24 11:23 PM   Specimen: BLOOD  Result Value Ref  Range Status   Specimen Description BLOOD LEFT ANTECUBITAL  Final   Special Requests   Final    BOTTLES DRAWN AEROBIC AND ANAEROBIC Blood Culture results may not be optimal due to an inadequate volume of blood received in culture bottles   Culture   Final    NO GROWTH 4 DAYS Performed at [facility], [address] [place], [place] [address]    Report Status PENDING  Incomplete    Coagulation Studies: No results for input(s): LABPROT, INR in the last 72 hours.  Urinalysis: No results for input(s): COLORURINE, LABSPEC, PHURINE, GLUCOSEU, HGBUR, BILIRUBINUR, KETONESUR, PROTEINUR, UROBILINOGEN, NITRITE, LEUKOCYTESUR in the last 72 hours.  Invalid input(s): APPERANCEUR      Imaging: US  Venous Img Lower Bilateral (DVT) Result Date: 05/27/2024 CLINICAL DATA:  Bilateral lower extremity swelling EXAM: BILATERAL LOWER EXTREMITY VENOUS DOPPLER ULTRASOUND TECHNIQUE: Gray-scale sonography with graded compression, as well as color Doppler and duplex ultrasound were performed to evaluate the lower extremity deep venous systems from the level of the common femoral vein and including the common femoral, femoral, profunda femoral, popliteal and calf veins including the posterior tibial, peroneal and gastrocnemius veins when visible. The superficial great saphenous vein was also interrogated. Spectral Doppler was utilized to evaluate flow at rest and with distal augmentation maneuvers in the common femoral, femoral and popliteal veins. COMPARISON:  None Available. FINDINGS: RIGHT LOWER EXTREMITY Common Femoral Vein: No evidence of thrombus. Normal compressibility, respiratory phasicity and response to augmentation. Saphenofemoral Junction: No evidence of thrombus. Normal compressibility and flow on color Doppler imaging. Profunda Femoral Vein: No evidence of thrombus. Normal compressibility and flow on color Doppler imaging. Femoral Vein: No evidence of thrombus. Normal  compressibility, respiratory phasicity and response to augmentation. Popliteal Vein: No evidence of thrombus. Normal compressibility, respiratory phasicity and response to augmentation. Calf Veins: Below the knee amputation. Superficial Great Saphenous Vein: No evidence of thrombus. Normal compressibility. Venous Reflux:  None. Other Findings:  None. LEFT LOWER EXTREMITY Common Femoral Vein: No evidence of thrombus. Normal compressibility, respiratory phasicity and response to augmentation. Saphenofemoral Junction: No evidence of thrombus. Normal compressibility and flow on color Doppler imaging. Profunda Femoral Vein: No evidence of thrombus. Normal compressibility and flow on color Doppler imaging. Femoral Vein: No evidence of thrombus. Normal compressibility, respiratory phasicity and response to augmentation. Popliteal Vein: No evidence of thrombus. Normal compressibility, respiratory phasicity and response to augmentation. Calf Veins: Below the knee amputation. Superficial Great Saphenous Vein: No evidence of thrombus. Normal compressibility.  Venous Reflux:  None. Other Findings:  None. IMPRESSION: No evidence of deep venous thrombosis in either lower extremity. Electronically Signed   By: Wilkie Lent M.D.   On: 05/27/2024 08:55   DG Chest Port 1 View Result Date: 05/25/2024 CLINICAL DATA:  Shortness of breath. EXAM: PORTABLE CHEST 1 VIEW COMPARISON:  Radiograph 05/21/2024, most recent CT 05/11/24 FINDINGS: Right-sided dialysis catheter tip overlies the SVC. Bilateral pleural effusions, left greater than right with bibasilar opacities, appearing rounded on the left. Cardiomegaly is stable. No pulmonary edema or pneumothorax. Postsurgical change in the thoracolumbar spine. IMPRESSION: 1. New right-sided dialysis catheter. Otherwise stable radiographic appearance of the chest. Unchanged bilateral pleural effusions, left greater than right with bibasilar opacities. 2. Stable cardiomegaly. Electronically  Signed   By: Andrea Gasman M.D.   On: 05/25/2024 18:35      Medications:    piperacillin -tazobactam (ZOSYN )  IV 2.25 g (05/27/24 0900)       azithromycin   250 mg Oral Daily   bisacodyl   10 mg Oral QHS   carvedilol   6.25 mg Oral BID WC   Chlorhexidine  Gluconate Cloth  6 each Topical Q0600   diltiazem   240 mg Oral QPM   epoetin  alfa-epbx (RETACRIT ) injection  10,000 Units Intravenous Q T,Th,Sat-1800   famotidine   10 mg Oral Daily   furosemide   80 mg Intravenous BID   And   furosemide   40 mg Intravenous BID   heparin   5,000 Units Subcutaneous Q8H   insulin  aspart  0-9 Units Subcutaneous Q4H   ipratropium-albuterol   3 mL Nebulization TID   lactulose   20 g Oral BID   linaclotide   145 mcg Oral QAC breakfast   linagliptin   5 mg Oral Daily   melatonin  10 mg Oral QHS   polyethylene glycol  17 g Oral BID   senna-docusate  1 tablet Oral BID   acetaminophen  **OR** acetaminophen , ALPRAZolam , bisacodyl , metoprolol  tartrate, ondansetron  **OR** ondansetron  (ZOFRAN ) IV, oxyCODONE   Assessment/ Plan:  Eugene [name] is a 63 y.o.  male  with past medical conditions including diabetes, bilateral BKA, suprapubic catheter, hepatitis C, who was admitted to [facility] on 05/21/2024 for Bronchospasm [J98.01] Hypoxia [R09.02] ESRD (end stage renal disease) (HCC) [N18.6]    Acute kidney injury with proteinuria and hyperkalemia. On chronic kidney disease stage IV, creatinine 3.74, GFR of 18 on 04/30/24. Acute kidney injury likely secondary to progression of kidney disease and volume overload. Patient has had multiple recent admissions where he has  remained borderline but stable with renal function. It has been discussed with patient during recent admissions that he will likely require dialysis in the near future. Patient understands that due to current symptoms and lack of effective response to medications, we feel it is time to pursue dialysis.  We appreciate vascular surgery placing right IJ PermCath on  05/22/2024.  Dialysis received yesterday, UF 1L achieved.  Dialysis navigator confirmed outpatient dialysis clinic at [facility] on a MWF schedule, 1145 chair time. Will send orders.  Patient remains stable and receive next treatment at outpatient dialysis clinic on Friday.  Currently seated in chair, will stay up for 4 hours to satisfy outpatient clinic requirement.  Patient continues to aggressively voice his uncertainty to continue dialysis. He has been notified that if he would like to discontinue, we would support and honor his decision.   Lab Results  Component Value Date   CREATININE 2.68 (H) 05/27/2024   CREATININE 3.04 (H) 05/26/2024   CREATININE 3.88 (H) 05/25/2024  Intake/Output Summary (Last 24 hours) at 05/27/2024 1237 Last data filed at 05/27/2024 0900 Gross per 24 hour  Intake 480 ml  Output 700 ml  Net -220 ml    2. Acute respiratory failure requiring increased oxygen.  Chest xray negative for edema. Patient continues to request increased oxygen requirements.     3.  Hypertension with chronic kidney disease.  Currently receiving IV furosemide  120mg  twice daily and metoprolol .  Blood pressure 130/51, stable   4.  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 Urine protein to creatinine ratio of 15.4 from previous admission.  Proteinuria likely secondary to diabetic nephropathy.  Hemoglobin A1c of 6.0% from 04/14/2024.  Primary team will continue management of SSI  5. Anemia of chronic kidney disease Lab Results  Component Value Date   HGB 8.5 (L) 05/27/2024    Hemoglobin 8.1.  Previous iron  studies acceptable.  Will continue Mircera outpatient    LOS: 5 Labrittany Wechter 9/3/202512:37 PM

## 2024-05-27 NOTE — Progress Notes (Signed)
 Pt accepted at [facility] on MWF at 11:45 chair time.  Pt can start  05/29/2024 and will need to arrive at 11:15am for any paperwork that needs to be signed.                       SABRA Suzen Satchel Dialysis Navigator [phone]

## 2024-05-27 NOTE — Progress Notes (Signed)
 Mobility Specialist - Progress Note   05/27/24 1441  Mobility  Activity Mechanically lifted from chair to bed  Level of Assistance Maximum assist, patient does 25-49%  Assistive Device  Water quality scientist)  Activity Response Tolerated well  Mobility visit 1 Mobility  Mobility Specialist Start Time (ACUTE ONLY) 1414  Mobility Specialist Stop Time (ACUTE ONLY) 1430  Mobility Specialist Time Calculation (min) (ACUTE ONLY) 16 min   Pt transferred from the recliner to bed via Hoyer lift +2 for safety, tolerated well. Pt left semi fowler with alarm set and needs within reach.  America Silvan Mobility Specialist 05/27/24 2:43 PM

## 2024-05-27 NOTE — Progress Notes (Signed)
 Progress Note   Patient: Eugene [name] FMW:[id number] DOB: 1961/08/30 DOA: 05/21/2024     5 DOS: the patient was seen and examined on 05/27/2024   Brief hospital course: 63 y.o. M with hx CKD V baseline Cr 4.7 not yet on HD, MO, DM, bilateral BKA, hx hep C, remote history epidural abscess/vertebral osteo, now with neurogenic bladder s/p SP catheter, recent ileus, last admitted 8/7 to 8/23 for facial swelling and respiratory failure, diuresed and Nephrology consulted, treated medically.  Discharged 4 days ago, on new O2.  Since discharge, has had dyspnea, waxing and waning, worse with lying flat.  Today, after eating, his breathing got much worse, he needed more oxygen to maintain O2 sats and they told him he needed to come to the hospital for HD.    In the ER, K 5.3, BUN 49, Cr 4.9 stable from last discharge.  However, short of breath, wheezing and requiring back up to 9L HFNC.   BNP 450, WBC normal, Hgb 8.7, K 5.3, Cr 4.9 lact normal, Cr 24  Troponin with a flat curve, CXR old left effusion   8/29: Vital stable, on 7 L of oxygen.  Nephrology was consulted and HD catheter placement was ordered, patient will be started on dialysis afterwards. 8/30.  Patient seen while on dialysis.  Was on 5 L of oxygen.  Feels a little bit better. 8/31.  Patient on 4 L high flow nasal cannula this morning.  Hemoglobin 7.9, potassium 5.0 9/1.  SVT with dialysis.  IV metoprolol  ordered.  Patient taken down for dialysis before toprol  given.  Will change toprol  to low dose coreg . 9/2.  SVT again with dialysis.  Increased dose of Coreg  and Cardizem  CD.  Unable to get a CT scan of the chest since patient is new to dialysis.  VQ scan also would not be a good test.  Will get a sonogram of the thighs to rule out DVT. 9/3: Hemodynamically stable and saturating well on 5 L of oxygen, patient was very resistant to wean further.  Lower extremity venous Doppler was negative for DVT.  D-dimer significantly elevated at 9.77.  VQ  scan ordered-hoping can provide some utility.  Now had outpatient dialysis chair where he can start on 9/5.  Assessment and Plan: * SVT (supraventricular tachycardia) (HCC) While on dialysis 2 days in a row.  As needed IV metoprolol .  Continue to monitor.  Increase dose of Coreg  and Cardizem  CD.  Unable to get a CT scan of the chest secondary to patient being new to dialysis.  VQ scan was ordered as it might not be very useful with the hope that it can give some answers, elevated D-dimer and lower extremity venous Doppler negative for DVT.   ESRD (end stage renal disease) (HCC) AKI on CKD stage IV now progressed to end-stage renal disease.  PermCath placed on 8/29.  First dialysis on 8/30.  Second dialysis on 9/1.  Third dialysis on 9/2.   Patient now have outpatient dialysis chair available starting from 9/5 -Continue scheduled dialysis  Acute respiratory failure with hypoxia (HCC) On admission requiring 9L and symptomatically dyspnea and tachypneic.  Today on 5 L high flow nasal cannula.   Started on empiric antibiotics on 9/1 for possibility of pneumonia.  - Continue supplemental oxygen-wean as tolerated-patient is currently resistant to further decrease the oxygen. -We will obtain VQ scan with the hope that it can provide some utility.  Hyperkalemia Improved with dialysis treatments  Acute on chronic diastolic CHF (congestive  heart failure) (HCC) Due to fluid overload from renal failure.  Echo in July showed EF 55-60% - IV Lasix  120 mg twice a day and dialysis.    Thrombocytopenia (HCC) Platelet count in normal range today  Class 3 obesity BMI 55.2, complicates care  Hypothyroidism Continue levothyroxine   Essential hypertension - IV Lasix  - Continue metoprolol   Type 2 diabetes mellitus with peripheral neuropathy (HCC) Last A1c 6.0, on Tradjenta   S/P bilateral BKA (below knee amputation) (HCC) No acute concern.  Suprapubic catheter (HCC)    Normocytic anemia Likely  combination between iron  deficiency and anemia of chronic disease.  Hemoglobin 8.5.  Watch with dialysis  Chronic hepatitis C without hepatic coma (HCC) Follow-up as outpatient  Chronic pain syndrome Continue home oxycodone    Subjective: Patient was sitting in chair when seen today.  Overall not feeling well, unable to explain any symptoms.  No chest pain.  Physical Exam: Vitals:   05/27/24 0752 05/27/24 0821 05/27/24 1340 05/27/24 1404  BP:  (!) 130/51  (!) 115/59  Pulse:  (!) 36  71  Resp:  16    Temp:    98.9 F (37.2 C)  TempSrc:      SpO2: 94% 97% 97% 98%  Weight:       General.  Morbidly obese gentleman, in no acute distress. Pulmonary.  Lungs clear bilaterally, normal respiratory effort. CV.  Regular rate and rhythm, no JVD, rub or murmur. Abdomen.  Soft, nontender, nondistended, BS positive. CNS.  Alert and oriented .  No focal neurologic deficit. Extremities.  Bilateral BKA Psychiatry.  Judgment and insight appears normal.    Data Reviewed: Prior data reviewed  Family Communication: Discussed with patient  Disposition: Status is: Inpatient Remains inpatient appropriate because: With repeated episodes of SVT during dialysis I will increase Cardizem  and Coreg , get a CT scan of the chest to rule out PE.  Will need to get respiratory status better prior to disposition.  Planned Discharge Destination: Long-term care  DVT prophylaxis.  Subcu heparin  Time spent: 50 minutes  This record has been created using Conservation officer, historic buildings. Errors have been sought and corrected,but may not always be located. Such creation errors do not reflect on the standard of care.   Author: Amaryllis Dare, MD 05/27/2024 2:27 PM  For on call review [email or web address].

## 2024-05-27 NOTE — TOC Progression Note (Signed)
 Transition of Care [facility]) - Progression Note    Patient Details  Name: Eugene [name] MRN: [id number] Date of Birth: Aug 11, 1961  Transition of Care [facility]) CM/SW Contact  Marinda Cooks, RN Phone Number: 05/27/2024, 1:36 PM  Clinical Narrative:    This CM updated by [facility] Dialysis Liaison Suzen Satchel pt has been  approved for Outpatient Dialysis chair for [facility] MWF with a chair time of 11:45am. Pt not medically cleared to dc today due to increased oxygen needs per medical team. TOC will cont follow dc planning / care coordination and update as applicable.                     Expected Discharge Plan and Services                                               Social Drivers of Health (SDOH) Interventions SDOH Screenings   Food Insecurity: Patient Declined (05/21/2024)  Housing: Low Risk  (05/22/2024)  Transportation Needs: No Transportation Needs (05/22/2024)  Utilities: Not At Risk (05/22/2024)  Financial Resource Strain: Medium Risk (02/02/2019)  Physical Activity: Unknown (02/02/2019)  Social Connections: Somewhat Isolated (02/02/2019)  Stress: Stress Concern Present (02/02/2019)  Tobacco Use: High Risk (05/22/2024)    Readmission Risk Interventions    05/05/2024    3:05 PM  Readmission Risk Prevention Plan  Transportation Screening Complete  Medication Review (RN Care Manager) Complete  PCP or Specialist appointment within 3-5 days of discharge Complete  SW Recovery Care/Counseling Consult Complete  Palliative Care Screening Not Applicable  Skilled Nursing Facility Complete

## 2024-05-28 DIAGNOSIS — J9801 Acute bronchospasm: Secondary | ICD-10-CM | POA: Diagnosis not present

## 2024-05-28 DIAGNOSIS — R0902 Hypoxemia: Secondary | ICD-10-CM | POA: Diagnosis not present

## 2024-05-28 DIAGNOSIS — I471 Supraventricular tachycardia, unspecified: Secondary | ICD-10-CM | POA: Diagnosis not present

## 2024-05-28 DIAGNOSIS — N186 End stage renal disease: Secondary | ICD-10-CM | POA: Diagnosis not present

## 2024-05-28 LAB — GLUCOSE, CAPILLARY
Glucose-Capillary: 138 mg/dL — ABNORMAL HIGH (ref 70–99)
Glucose-Capillary: 146 mg/dL — ABNORMAL HIGH (ref 70–99)
Glucose-Capillary: 162 mg/dL — ABNORMAL HIGH (ref 70–99)
Glucose-Capillary: 213 mg/dL — ABNORMAL HIGH (ref 70–99)

## 2024-05-28 MED ORDER — OXYCODONE HCL 10 MG PO TABS: 5.0000 mg | ORAL_TABLET | Freq: Four times a day (QID) | ORAL | 0 refills | Status: DC | PRN

## 2024-05-28 MED ORDER — INSULIN ASPART 100 UNIT/ML IJ SOLN: 0.0000 [IU] | Freq: Every day | INTRAMUSCULAR | Status: DC

## 2024-05-28 MED ORDER — IPRATROPIUM-ALBUTEROL 0.5-2.5 (3) MG/3ML IN SOLN: 3.0000 mL | Freq: Two times a day (BID) | RESPIRATORY_TRACT | Status: DC

## 2024-05-28 MED ORDER — METOCLOPRAMIDE HCL 10 MG PO TABS: 5.0000 mg | ORAL_TABLET | Freq: Three times a day (TID) | ORAL | Status: AC | PRN

## 2024-05-28 MED ORDER — CARVEDILOL 6.25 MG PO TABS: 6.2500 mg | ORAL_TABLET | Freq: Two times a day (BID) | ORAL | Status: AC

## 2024-05-28 MED ORDER — DILTIAZEM HCL ER COATED BEADS 240 MG PO CP24: 240.0000 mg | ORAL_CAPSULE | Freq: Every evening | ORAL | Status: AC

## 2024-05-28 MED ORDER — INSULIN ASPART 100 UNIT/ML IJ SOLN
0.0000 [IU] | Freq: Three times a day (TID) | INTRAMUSCULAR | Status: DC
  Administered 2024-05-28: 3 [IU] via SUBCUTANEOUS
  Filled 2024-05-28: qty 1

## 2024-05-28 MED ORDER — FUROSEMIDE 80 MG PO TABS: 80.0000 mg | ORAL_TABLET | Freq: Every day | ORAL | Status: DC

## 2024-05-28 NOTE — Progress Notes (Signed)
 Patient sat in chair yesterday 9/3 from [id number].

## 2024-05-28 NOTE — Progress Notes (Signed)
 [facility]  ROUNDING NOTE   Subjective:   Eugene [name]  is a 63 y.o.  male  with past medical conditions including diabetes, bilateral BKA, suprapubic catheter, and hepatitis C. Patient presents to ED from SNF with shortness of breath and has been admitted for Bronchospasm [J98.01] Hypoxia [R09.02] ESRD (end stage renal disease) (HCC) [N18.6]  Patient is known to our practice from recent admission.   Update:  Patient seen laying in bed Alert No questions or complaints to offer   09/03 0701 - 09/04 0700 In: 1200 [P.O.:1200] Out: 825 [Urine:825]  Lab Results  Component Value Date   CREATININE 2.68 (H) 05/27/2024   CREATININE 3.04 (H) 05/26/2024   CREATININE 3.88 (H) 05/25/2024     Objective:  Vital signs in last 24 hours:  Temp:  [98.4 F (36.9 C)-98.9 F (37.2 C)] 98.4 F (36.9 C) (09/04 0737) Pulse Rate:  [66-71] 68 (09/04 0737) Resp:  [17-19] 19 (09/04 0737) BP: (96-143)/(53-67) 143/67 (09/04 0737) SpO2:  [96 %-98 %] 96 % (09/04 0737) Weight:  [127.4 kg] 127.4 kg (09/04 0500)  Weight change: -0.8 kg Filed Weights   05/26/24 0353 05/26/24 0801 05/28/24 0500  Weight: 128.2 kg 128.2 kg 127.4 kg    Intake/Output: I/O last 3 completed shifts: In: 1200 [P.O.:1200] Out: 1525 [Urine:1525]   Intake/Output this shift:  Total I/O In: 480 [P.O.:480] Out: -   Physical Exam: General: NAD, laying in bed  Head: Normocephalic  Eyes: Anicteric  Lungs:  Diminished, 6L HFNC  Heart: Regular rate   Abdomen:  Distended and firm, obese  Extremities: Trace peripheral edema.,  Bilateral BKA  Neurologic: Awake and alert  Skin: No rashes  Access: Right IJ PermCath  Suprapubic catheter in place  Basic Metabolic Panel: Recent Labs  Lab 05/23/24 0819 05/24/24 0642 05/25/24 0455 05/26/24 0812 05/27/24 0504  NA 138 138 137 140 138  K 5.7* 5.0 5.0 4.2 3.5  CL 106 107 107 102 99  CO2 23 24 23 28 29   GLUCOSE 141* 167* 182* 156* 165*  BUN 52* 45* 42*  28* 18  CREATININE 4.78* 4.07* 3.88* 3.04* 2.68*  CALCIUM  7.2* 7.0* 6.9* 7.1* 7.2*  PHOS 6.3*  --   --  3.3  --     Liver Function Tests: Recent Labs  Lab 05/21/24 2323 05/23/24 0819 05/26/24 0812  AST 16  --   --   ALT 14  --   --   ALKPHOS 49  --   --   BILITOT 0.4  --   --   PROT 5.6*  --   --   ALBUMIN  1.8* 1.7* 1.7*   No results for input(s): LIPASE, AMYLASE in the last 168 hours.  No results for input(s): AMMONIA in the last 168 hours.  CBC: Recent Labs  Lab 05/21/24 2323 05/22/24 1718 05/23/24 0819 05/24/24 0642 05/25/24 0455 05/26/24 0812 05/27/24 0504  WBC 6.0   < > 6.6 5.2 3.7* 4.4 4.5  NEUTROABS 3.8  --   --   --   --  2.4  --   HGB 8.7*   < > 7.8* 7.9* 8.2* 8.1* 8.5*  HCT 27.9*   < > 25.5* 26.1* 26.8* 25.7* 26.6*  MCV 94.9   < > 95.5 95.6 95.0 92.1 92.4  PLT 218   < > 220 148* 133* 174 194   < > = values in this interval not displayed.    Cardiac Enzymes: No results for input(s): CKTOTAL, CKMB, CKMBINDEX, TROPONINI in  the last 168 hours.  BNP: Invalid input(s): POCBNP  CBG: Recent Labs  Lab 05/27/24 1934 05/28/24 0025 05/28/24 0510 05/28/24 0734 05/28/24 1102  GLUCAP 203* 162* 138* 146* 213*    Microbiology: Results for orders placed or performed during the hospital encounter of 05/21/24  Blood culture (routine x 2)     Status: None   Collection Time: 05/21/24 11:23 PM   Specimen: BLOOD  Result Value Ref Range Status   Specimen Description   Final    BLOOD RIGHT ANTECUBITAL Performed at [facility], [address], [place], [place] [address]    Special Requests   Final    BOTTLES DRAWN AEROBIC AND ANAEROBIC Blood Culture adequate volume Performed at [facility], [address], [place], [place] [address]    Culture   Final    NO GROWTH 5 DAYS Performed at [facility], [address], [place], [place] [address]    Report Status 05/27/2024 FINAL  Final  Blood culture (routine x 2)      Status: None   Collection Time: 05/21/24 11:23 PM   Specimen: BLOOD  Result Value Ref Range Status   Specimen Description   Final    BLOOD LEFT ANTECUBITAL Performed at [facility], [address] [place], [place] [address]    Special Requests   Final    BOTTLES DRAWN AEROBIC AND ANAEROBIC Blood Culture results may not be optimal due to an inadequate volume of blood received in culture bottles Performed at [facility], [address] [place], [place] [address]    Culture   Final    NO GROWTH 5 DAYS Performed at [facility], [address] [place], [place] [address]    Report Status 05/27/2024 FINAL  Final    Coagulation Studies: No results for input(s): LABPROT, INR in the last 72 hours.  Urinalysis: No results for input(s): COLORURINE, LABSPEC, PHURINE, GLUCOSEU, HGBUR, BILIRUBINUR, KETONESUR, PROTEINUR, UROBILINOGEN, NITRITE, LEUKOCYTESUR in the last 72 hours.  Invalid input(s): APPERANCEUR      Imaging: DG Chest 2 View Result Date: 05/27/2024 CLINICAL DATA:  Hypoxia. EXAM: CHEST - 2 VIEW COMPARISON:  Radiograph 05/25/2024 FINDINGS: Right-sided dialysis catheter unchanged. Cardiomegaly is unchanged. Stable left pleural effusion and basilar opacity. Small right pleural effusion, unchanged. No new airspace disease. Thoracic spine hardware is again seen. IMPRESSION: 1. Stable left pleural effusion and basilar opacity. 2. Small right pleural effusion, unchanged. 3. Unchanged cardiomegaly. Electronically Signed   By: Andrea Gasman M.D.   On: 05/27/2024 19:19   NM Pulmonary Perfusion Result Date: 05/27/2024 CLINICAL DATA:  Hypoxemia EXAM: NUCLEAR MEDICINE PERFUSION LUNG SCAN TECHNIQUE: Perfusion images were obtained in multiple projections after intravenous injection of radiopharmaceutical. RADIOPHARMACEUTICALS:  4.5 mCi Tc-36m MAA COMPARISON:  Radiograph 05/27/2024, CT chest 05/11/2024 FINDINGS: No wedge-shaped peripheral  perfusion defects to localize acute pulmonary embolism. Small peripheral defects in the RIGHT lower are favored atelectasis. Generalize attenuation in the LEFT lower lobe related to effusions. IMPRESSION: No evidence acute pulmonary embolism. Electronically Signed   By: Jackquline Boxer M.D.   On: 05/27/2024 17:36   US  Venous Img Lower Bilateral (DVT) Result Date: 05/27/2024 CLINICAL DATA:  Bilateral lower extremity swelling EXAM: BILATERAL LOWER EXTREMITY VENOUS DOPPLER ULTRASOUND TECHNIQUE: Gray-scale sonography with graded compression, as well as color Doppler and duplex ultrasound were performed to evaluate the lower extremity deep venous systems from the level of the common femoral vein and including the common femoral, femoral, profunda femoral, popliteal and  calf veins including the posterior tibial, peroneal and gastrocnemius veins when visible. The superficial great saphenous vein was also interrogated. Spectral Doppler was utilized to evaluate flow at rest and with distal augmentation maneuvers in the common femoral, femoral and popliteal veins. COMPARISON:  None Available. FINDINGS: RIGHT LOWER EXTREMITY Common Femoral Vein: No evidence of thrombus. Normal compressibility, respiratory phasicity and response to augmentation. Saphenofemoral Junction: No evidence of thrombus. Normal compressibility and flow on color Doppler imaging. Profunda Femoral Vein: No evidence of thrombus. Normal compressibility and flow on color Doppler imaging. Femoral Vein: No evidence of thrombus. Normal compressibility, respiratory phasicity and response to augmentation. Popliteal Vein: No evidence of thrombus. Normal compressibility, respiratory phasicity and response to augmentation. Calf Veins: Below the knee amputation. Superficial Great Saphenous Vein: No evidence of thrombus. Normal compressibility. Venous Reflux:  None. Other Findings:  None. LEFT LOWER EXTREMITY Common Femoral Vein: No evidence of thrombus. Normal  compressibility, respiratory phasicity and response to augmentation. Saphenofemoral Junction: No evidence of thrombus. Normal compressibility and flow on color Doppler imaging. Profunda Femoral Vein: No evidence of thrombus. Normal compressibility and flow on color Doppler imaging. Femoral Vein: No evidence of thrombus. Normal compressibility, respiratory phasicity and response to augmentation. Popliteal Vein: No evidence of thrombus. Normal compressibility, respiratory phasicity and response to augmentation. Calf Veins: Below the knee amputation. Superficial Great Saphenous Vein: No evidence of thrombus. Normal compressibility. Venous Reflux:  None. Other Findings:  None. IMPRESSION: No evidence of deep venous thrombosis in either lower extremity. Electronically Signed   By: Wilkie Lent M.D.   On: 05/27/2024 08:55      Medications:    piperacillin -tazobactam (ZOSYN )  IV 2.25 g (05/28/24 0850)       azithromycin   250 mg Oral Daily   bisacodyl   10 mg Oral QHS   carvedilol   6.25 mg Oral BID WC   Chlorhexidine  Gluconate Cloth  6 each Topical Q0600   diltiazem   240 mg Oral QPM   famotidine   10 mg Oral Daily   furosemide   80 mg Intravenous BID   And   furosemide   40 mg Intravenous BID   heparin   5,000 Units Subcutaneous Q8H   insulin  aspart  0-5 Units Subcutaneous QHS   insulin  aspart  0-9 Units Subcutaneous TID WC   ipratropium-albuterol   3 mL Nebulization TID   lactulose   20 g Oral BID   linaclotide   145 mcg Oral QAC breakfast   linagliptin   5 mg Oral Daily   melatonin  10 mg Oral QHS   polyethylene glycol  17 g Oral BID   senna-docusate  1 tablet Oral BID   acetaminophen  **OR** acetaminophen , ALPRAZolam , bisacodyl , metoprolol  tartrate, ondansetron  **OR** ondansetron  (ZOFRAN ) IV, oxyCODONE , technetium albumin  aggregated  Assessment/ Plan:  [name] is a 63 y.o.  male  with past medical conditions including diabetes, bilateral BKA, suprapubic catheter, hepatitis C, who  was admitted to [facility] on 05/21/2024 for Bronchospasm [J98.01] Hypoxia [R09.02] ESRD (end stage renal disease) (HCC) [N18.6]    Acute kidney injury with proteinuria and hyperkalemia. On chronic kidney disease stage IV, creatinine 3.74, GFR of 18 on 04/30/24. Acute kidney injury likely secondary to progression of kidney disease and volume overload. Patient has had multiple recent admissions where he has  remained borderline but stable with renal function. It has been discussed with patient during recent admissions that he will likely require dialysis in the near future. Patient understands that due to current symptoms and lack of effective response to medications, we feel it  is time to pursue dialysis.  We appreciate vascular surgery placing right IJ PermCath on 05/22/2024.  Dialysis navigator confirmed outpatient dialysis clinic at [facility] on a MWF schedule, 1145 chair time.Patient is cleared to discharge from renal stance and continue dialysis on Friday.  Patient was able to sit in chair for 4 hours yesterday.   Lab Results  Component Value Date   CREATININE 2.68 (H) 05/27/2024   CREATININE 3.04 (H) 05/26/2024   CREATININE 3.88 (H) 05/25/2024     Intake/Output Summary (Last 24 hours) at 05/28/2024 1113 Last data filed at 05/28/2024 1100 Gross per 24 hour  Intake 1200 ml  Output 825 ml  Net 375 ml    2. Acute respiratory failure requiring increased oxygen.  Chest xray negative for edema. Remains on 6L [place].      3.  Hypertension with chronic kidney disease.  Currently receiving IV furosemide  120mg  twice daily and metoprolol .  Blood pressure 143/67, acceptable today   4.  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 Urine protein to creatinine ratio of 15.4 from previous admission.  Proteinuria likely secondary to diabetic nephropathy.  Hemoglobin A1c of 6.0% from 04/14/2024.  Primary team will continue management of SSI  5. Anemia of  chronic kidney disease Lab Results  Component Value Date   HGB 8.5 (L) 05/27/2024    Hemoglobin 8.5.  Previous iron  studies acceptable.  Will continue Mircera outpatient    LOS: 6 Dayzee Trower 9/4/202511:13 AM

## 2024-05-28 NOTE — TOC Transition Note (Signed)
 Transition of Care [facility]) - Discharge Note   Patient Details  Name: Eugene [name] MRN: [id number] Date of Birth: 05/19/1961  Transition of Care [facility]) CM/SW Contact:  Marinda Cooks, RN Phone Number: 05/28/2024, 1:34 PM   Clinical Narrative:    This CM updated by covering MD pt medically cleared to dc today and has active DC order . This CM spoke with [facility] Admission liaison @ [facility] and confirmed pt can be received today  DC transportation confirmed for pt with [facility] team updated . No additional DC needs requested by medical team or identified by CM at this time .     Final next level of care: Long Term Acute Care (LTAC) Barriers to Discharge: No Barriers Identified   Patient Goals and CMS Choice Patient states their goals for this hospitalization and ongoing recovery are:: Return to [facility]          Discharge Placement  Pt returning to SNF into his LTC bed               Patient to be transferred to facility by: Ambulance Name of family member notified: Attempted to reach pt on phone Patient and family notified of of transfer: 05/28/24  Discharge Plan and Services Additional resources added to the After Visit Summary for                  DME Arranged: N/A DME Agency: NA         HH Agency: NA        Social Drivers of Health (SDOH) Interventions SDOH Screenings   Food Insecurity: Patient Declined (05/21/2024)  Housing: Low Risk  (05/22/2024)  Transportation Needs: No Transportation Needs (05/22/2024)  Utilities: Not At Risk (05/22/2024)  Financial Resource Strain: Medium Risk (02/02/2019)  Physical Activity: Unknown (02/02/2019)  Social Connections: Somewhat Isolated (02/02/2019)  Stress: Stress Concern Present (02/02/2019)  Tobacco Use: High Risk (05/22/2024)     Readmission Risk Interventions    05/05/2024    3:05 PM  Readmission Risk Prevention Plan  Transportation Screening Complete  Medication Review (RN Care Manager) Complete   PCP or Specialist appointment within 3-5 days of discharge Complete  SW Recovery Care/Counseling Consult Complete  Palliative Care Screening Not Applicable  Skilled Nursing Facility Complete

## 2024-05-28 NOTE — Discharge Summary (Signed)
 Physician Discharge Summary   Patient: Eugene [name] MRN: [id number] DOB: January 14, 1961  Admit date:     05/21/2024  Discharge date: 05/28/24  Discharge Physician: Amaryllis Dare   PCP: Care, [facility]   Recommendations at discharge:  Please obtain CBC and BMP on follow-up Please continue with outpatient dialysis and follow-up with nephrology Follow-up with primary care provider  Discharge Diagnoses: Principal Problem:   SVT (supraventricular tachycardia) (HCC) Active Problems:   Acute kidney injury superimposed on CKD (HCC)   ESRD (end stage renal disease) (HCC)   Hyperkalemia   Acute respiratory failure with hypoxia (HCC)   Acute on chronic diastolic CHF (congestive heart failure) (HCC)   Chronic pain syndrome   Chronic hepatitis C without hepatic coma (HCC)   Normocytic anemia   Suprapubic catheter (HCC)   S/P bilateral BKA (below knee amputation) (HCC)   Type 2 diabetes mellitus with peripheral neuropathy (HCC)   Essential hypertension   Hypothyroidism   Class 3 obesity   Bronchospasm   Thrombocytopenia (HCC)   Hypoxia   Hospital Course: 63 y.o. M with hx CKD V baseline Cr 4.7 not yet on HD, MO, DM, bilateral BKA, hx hep C, remote history epidural abscess/vertebral osteo, now with neurogenic bladder s/p SP catheter, recent ileus, last admitted 8/7 to 8/23 for facial swelling and respiratory failure, diuresed and Nephrology consulted, treated medically.  Discharged 4 days ago, on new O2.  Since discharge, has had dyspnea, waxing and waning, worse with lying flat.  Today, after eating, his breathing got much worse, he needed more oxygen to maintain O2 sats and they told him he needed to come to the hospital for HD.    In the ER, K 5.3, BUN 49, Cr 4.9 stable from last discharge.  However, short of breath, wheezing and requiring back up to 9L HFNC.   BNP 450, WBC normal, Hgb 8.7, K 5.3, Cr 4.9 lact normal, Cr 24  Troponin with a flat curve, CXR old left effusion    8/29: Vital stable, on 7 L of oxygen.  Nephrology was consulted and HD catheter placement was ordered, patient will be started on dialysis afterwards. 8/30.  Patient seen while on dialysis.  Was on 5 L of oxygen.  Feels a little bit better. 8/31.  Patient on 4 L high flow nasal cannula this morning.  Hemoglobin 7.9, potassium 5.0 9/1.  SVT with dialysis.  IV metoprolol  ordered.  Patient taken down for dialysis before toprol  given.  Will change toprol  to low dose coreg . 9/2.  SVT again with dialysis.  Increased dose of Coreg  and Cardizem  CD.  Unable to get a CT scan of the chest since patient is new to dialysis.  VQ scan also would not be a good test.  Will get a sonogram of the thighs to rule out DVT. 9/3: Hemodynamically stable and saturating well on 5 L of oxygen, patient was very resistant to wean further.  Lower extremity venous Doppler was negative for DVT.  D-dimer significantly elevated at 9.77.  VQ scan ordered-hoping can provide some utility.  Now had outpatient dialysis chair where he can start on 9/5.  9/4: Remained hemodynamically stable on baseline oxygen use of 5 to 6 L.  VQ scan was negative for any PE.  Discussed with nephrology and switched him to p.o. Lasix  80 mg daily. He will start his outpatient dialysis from tomorrow. Clinically appears euvolemic.  Patient is on opiates for chronic pain, history of severe constipation.  He is on multiple as  needed medications, please give him appropriately and avoid constipation.  He was started on Cardizem  and carvedilol , home metoprolol  was discontinued.  Received antibiotics which were not continued on discharge.  Patient will continue on current medications and need to have a close follow-up with his providers for further assistance.  Assessment and Plan: * SVT (supraventricular tachycardia) (HCC) While on dialysis 2 days in a row.  As needed IV metoprolol .  Continue to monitor.  Increase dose of Coreg  and Cardizem  CD.  Unable to get  a CT scan of the chest secondary to patient being new to dialysis.  VQ scan and lower extremity venous Doppler was negative.  ESRD (end stage renal disease) (HCC) AKI on CKD stage IV now progressed to end-stage renal disease.  PermCath placed on 8/29.  First dialysis on 8/30.  Second dialysis on 9/1.  Third dialysis on 9/2.   Patient now have outpatient dialysis chair available starting from 9/5 -Continue scheduled dialysis  Acute respiratory failure with hypoxia (HCC) On admission requiring 9L and symptomatically dyspnea and tachypneic.  Today on 5 L high flow nasal cannula.   Started on empiric antibiotics on 9/1 for possibility of pneumonia.  - Continue supplemental oxygen-wean as tolerated-patient is currently resistant to further decrease the oxygen.  Hyperkalemia Improved with dialysis treatments  Acute on chronic diastolic CHF (congestive heart failure) (HCC) Due to fluid overload from renal failure.  Echo in July showed EF 55-60% -Placed on 80 mg of p.o. Lasix  daily and he will continue with dialysis for volume control.    Thrombocytopenia (HCC) Platelet count in normal range today  Class 3 obesity BMI 55.2, complicates care  Hypothyroidism Continue levothyroxine   Essential hypertension -Continue carvedilol  and Cardizem   Type 2 diabetes mellitus with peripheral neuropathy (HCC) Last A1c 6.0, on Tradjenta  Will continue prior home medications  S/P bilateral BKA (below knee amputation) (HCC) No acute concern.  Suprapubic catheter (HCC)    Normocytic anemia Likely combination between iron  deficiency and anemia of chronic disease.  Hemoglobin 8.5.  Watch with dialysis  Chronic hepatitis C without hepatic coma (HCC) Follow-up as outpatient  Chronic pain syndrome Continue home oxycodone   Pain control - [place]  Controlled Substance Reporting System database was reviewed. and patient was instructed, not to drive, operate heavy machinery, perform activities at  heights, swimming or participation in water activities or provide baby-sitting services while on Pain, Sleep and Anxiety Medications; until their outpatient Physician has advised to do so again. Also recommended to not to take more than prescribed Pain, Sleep and Anxiety Medications.  Consultants: Nephrology, vascular surgery Procedures performed: HD catheter placement, hemodialysis Disposition: Skilled nursing facility Diet recommendation:  Cardiac and Carb modified diet DISCHARGE MEDICATION: Allergies as of 05/28/2024   No Known Allergies      Medication List     STOP taking these medications    feeding supplement Liqd   metoprolol  succinate 25 MG 24 hr tablet Commonly known as: TOPROL -XL   ondansetron  4 MG tablet Commonly known as: ZOFRAN    patiromer  8.4 g packet Commonly known as: VELTASSA    sodium phosphate  Pediatric 3.5-9.5 GM/59ML enema       TAKE these medications    acetaminophen  325 MG tablet Commonly known as: TYLENOL  Take 650 mg by mouth in the morning and at bedtime.   bisacodyl  5 MG EC tablet Commonly known as: DULCOLAX Take 2 tablets (10 mg total) by mouth at bedtime. What changed: Another medication with the same name was removed. Continue taking this medication, and  follow the directions you see here.   carvedilol  6.25 MG tablet Commonly known as: COREG  Take 1 tablet (6.25 mg total) by mouth 2 (two) times daily with a meal.   cyanocobalamin  1000 MCG tablet Take 1 tablet (1,000 mcg total) by mouth daily.   diltiazem  240 MG 24 hr capsule Commonly known as: CARDIZEM  CD Take 1 capsule (240 mg total) by mouth every evening.   Doxepin  HCl 6 MG Tabs Take 6 mg by mouth at bedtime.   famotidine  10 MG tablet Commonly known as: PEPCID  Take 1 tablet (10 mg total) by mouth daily.   folic acid  1 MG tablet Commonly known as: FOLVITE  Take 1 tablet (1 mg total) by mouth daily.   furosemide  80 MG tablet Commonly known as: LASIX  Take 1 tablet (80 mg  total) by mouth daily. What changed:  medication strength how much to take   gabapentin  300 MG capsule Commonly known as: NEURONTIN  Take 300 mg by mouth at bedtime.   hydrocortisone  cream 1 % Apply 1 Application topically daily.   insulin  glargine-yfgn 100 UNIT/ML injection Commonly known as: SEMGLEE  Inject 0.1 mLs (10 Units total) into the skin 2 (two) times daily.   insulin  lispro 100 UNIT/ML injection Commonly known as: HUMALOG  Inject 0-15 Units into the skin 4 (four) times daily -  before meals and at bedtime. <150= 0 units 151-200= 3 units 201-250= 6 units 251-300= 9 units 301-350= 12 units 351-400= 15 units >400 call MD   Lactulose  20 GM/30ML Soln Take 30 mLs by mouth 2 (two) times daily.   levothyroxine  175 MCG tablet Commonly known as: SYNTHROID  Take 175 mcg by mouth at bedtime.   linaclotide  145 MCG Caps capsule Commonly known as: LINZESS  Take 1 capsule (145 mcg total) by mouth daily before breakfast.   melatonin 5 MG Tabs Take 10 mg by mouth at bedtime.   metoCLOPramide  10 MG tablet Commonly known as: REGLAN  Take 0.5 tablets (5 mg total) by mouth every 8 (eight) hours as needed for nausea or vomiting. What changed:  when to take this reasons to take this   Oxycodone  HCl 10 MG Tabs Take 0.5-1 tablets (5-10 mg total) by mouth every 6 (six) hours as needed. What changed: reasons to take this   polyethylene glycol 17 g packet Commonly known as: MIRALAX  / GLYCOLAX  Take 17 g by mouth 2 (two) times daily.   senna-docusate 8.6-50 MG tablet Commonly known as: Senokot-S Take 1 tablet by mouth 2 (two) times daily.   simethicone  80 MG chewable tablet Commonly known as: MYLICON Chew 1 tablet (80 mg total) by mouth 4 (four) times daily.   sitaGLIPtin 100 MG tablet Commonly known as: JANUVIA Take 100 mg by mouth daily.   Vitamin D  (Ergocalciferol ) 1.25 MG (50000 UNIT) Caps capsule Commonly known as: DRISDOL  Take 1 capsule (50,000 Units total) by mouth  every 7 (seven) days.        Follow-up Information     Dialysis, [facility] Follow up on 05/29/2024.   Why: Patient days of outpatient dialysis are Monday, Wednesday, Friday at 11:45am. Patient would need to arrive at 11:30am on his first day to fill out any paperwork that is needed. Contact information: [address] [place] [address] [phone]         Care, [facility]. Schedule an appointment as soon as possible for a visit in 1 week(s).   Specialty: Skilled Nursing Chief of Staff information: [address] [place] [address] [phone]  Discharge Exam: Filed Weights   05/26/24 0353 05/26/24 0801 05/28/24 0500  Weight: 128.2 kg 128.2 kg 127.4 kg   General.  Apically obese gentleman, in no acute distress. Pulmonary.  Lungs clear bilaterally, normal respiratory effort. CV.  Regular rate and rhythm, no JVD, rub or murmur. Abdomen.  Soft, nontender, nondistended, BS positive. CNS.  Alert and oriented .  No focal neurologic deficit. Extremities.  Bilateral BKA Psychiatry.  Judgment and insight appears normal.   Condition at discharge: stable  The results of significant diagnostics from this hospitalization (including imaging, microbiology, ancillary and laboratory) are listed below for reference.   Imaging Studies: DG Chest 2 View Result Date: 05/27/2024 CLINICAL DATA:  Hypoxia. EXAM: CHEST - 2 VIEW COMPARISON:  Radiograph 05/25/2024 FINDINGS: Right-sided dialysis catheter unchanged. Cardiomegaly is unchanged. Stable left pleural effusion and basilar opacity. Small right pleural effusion, unchanged. No new airspace disease. Thoracic spine hardware is again seen. IMPRESSION: 1. Stable left pleural effusion and basilar opacity. 2. Small right pleural effusion, unchanged. 3. Unchanged cardiomegaly. Electronically Signed   By: Andrea Gasman M.D.   On: 05/27/2024 19:19   NM Pulmonary Perfusion Result Date: 05/27/2024 CLINICAL  DATA:  Hypoxemia EXAM: NUCLEAR MEDICINE PERFUSION LUNG SCAN TECHNIQUE: Perfusion images were obtained in multiple projections after intravenous injection of radiopharmaceutical. RADIOPHARMACEUTICALS:  4.5 mCi Tc-33m MAA COMPARISON:  Radiograph 05/27/2024, CT chest 05/11/2024 FINDINGS: No wedge-shaped peripheral perfusion defects to localize acute pulmonary embolism. Small peripheral defects in the RIGHT lower are favored atelectasis. Generalize attenuation in the LEFT lower lobe related to effusions. IMPRESSION: No evidence acute pulmonary embolism. Electronically Signed   By: Jackquline Boxer M.D.   On: 05/27/2024 17:36   US  Venous Img Lower Bilateral (DVT) Result Date: 05/27/2024 CLINICAL DATA:  Bilateral lower extremity swelling EXAM: BILATERAL LOWER EXTREMITY VENOUS DOPPLER ULTRASOUND TECHNIQUE: Gray-scale sonography with graded compression, as well as color Doppler and duplex ultrasound were performed to evaluate the lower extremity deep venous systems from the level of the common femoral vein and including the common femoral, femoral, profunda femoral, popliteal and calf veins including the posterior tibial, peroneal and gastrocnemius veins when visible. The superficial great saphenous vein was also interrogated. Spectral Doppler was utilized to evaluate flow at rest and with distal augmentation maneuvers in the common femoral, femoral and popliteal veins. COMPARISON:  None Available. FINDINGS: RIGHT LOWER EXTREMITY Common Femoral Vein: No evidence of thrombus. Normal compressibility, respiratory phasicity and response to augmentation. Saphenofemoral Junction: No evidence of thrombus. Normal compressibility and flow on color Doppler imaging. Profunda Femoral Vein: No evidence of thrombus. Normal compressibility and flow on color Doppler imaging. Femoral Vein: No evidence of thrombus. Normal compressibility, respiratory phasicity and response to augmentation. Popliteal Vein: No evidence of thrombus. Normal  compressibility, respiratory phasicity and response to augmentation. Calf Veins: Below the knee amputation. Superficial Great Saphenous Vein: No evidence of thrombus. Normal compressibility. Venous Reflux:  None. Other Findings:  None. LEFT LOWER EXTREMITY Common Femoral Vein: No evidence of thrombus. Normal compressibility, respiratory phasicity and response to augmentation. Saphenofemoral Junction: No evidence of thrombus. Normal compressibility and flow on color Doppler imaging. Profunda Femoral Vein: No evidence of thrombus. Normal compressibility and flow on color Doppler imaging. Femoral Vein: No evidence of thrombus. Normal compressibility, respiratory phasicity and response to augmentation. Popliteal Vein: No evidence of thrombus. Normal compressibility, respiratory phasicity and response to augmentation. Calf Veins: Below the knee amputation. Superficial Great Saphenous Vein: No evidence of thrombus. Normal compressibility. Venous Reflux:  None. Other Findings:  None.  IMPRESSION: No evidence of deep venous thrombosis in either lower extremity. Electronically Signed   By: Wilkie Lent M.D.   On: 05/27/2024 08:55   DG Chest Port 1 View Result Date: 05/25/2024 CLINICAL DATA:  Shortness of breath. EXAM: PORTABLE CHEST 1 VIEW COMPARISON:  Radiograph 05/21/2024, most recent CT 05/11/24 FINDINGS: Right-sided dialysis catheter tip overlies the SVC. Bilateral pleural effusions, left greater than right with bibasilar opacities, appearing rounded on the left. Cardiomegaly is stable. No pulmonary edema or pneumothorax. Postsurgical change in the thoracolumbar spine. IMPRESSION: 1. New right-sided dialysis catheter. Otherwise stable radiographic appearance of the chest. Unchanged bilateral pleural effusions, left greater than right with bibasilar opacities. 2. Stable cardiomegaly. Electronically Signed   By: Andrea Gasman M.D.   On: 05/25/2024 18:35   PERIPHERAL VASCULAR CATHETERIZATION Result Date:  05/22/2024 See surgical note for result.  DG Chest Portable 1 View Result Date: 05/21/2024 CLINICAL DATA:  Respiratory distress EXAM: PORTABLE CHEST 1 VIEW COMPARISON:  05/12/2024 FINDINGS: Cardiac shadow is enlarged. Postsurgical changes are again noted in the thoracolumbar spine. Stable apparent loculated left effusion is noted. Small right effusion is noted as well. Mild bibasilar atelectasis is seen similar to that noted on the prior exam. No findings of fluid overload are seen. IMPRESSION: Overall stable appearance of the chest when compare with the prior exam. Electronically Signed   By: Oneil Devonshire M.D.   On: 05/21/2024 23:01   DG Chest Port 1 View Result Date: 05/12/2024 CLINICAL DATA:  Respiratory failure with hypoxia. Shortness of breath. Facial swelling. EXAM: PORTABLE CHEST 1 VIEW COMPARISON:  05/11/2024 FINDINGS: Stable mildly enlarged cardiac silhouette. Stable small to moderate-sized left pleural effusion, small right pleural effusion and bilateral lower lung zone linear atelectasis/scarring. Stable thoracic spine fixation hardware and post corpectomy hardware. IMPRESSION: 1. Stable small to moderate-sized left pleural effusion, small right pleural effusion and bilateral lower lung zone linear atelectasis/scarring. 2. Stable mild cardiomegaly. Electronically Signed   By: Elspeth Bathe M.D.   On: 05/12/2024 13:36   DG Abd 1 View Result Date: 05/12/2024 CLINICAL DATA:  [id number] Constipation [id number]. EXAM: ABDOMEN - 1 VIEW COMPARISON:  None Available. FINDINGS: The bowel gas pattern is non-obstructive. There is mild-to-moderate stool burden mainly overlying the rectum/sigmoid colon region. No evidence of pneumoperitoneum, within the limitations of a supine film. No acute osseous abnormalities. The soft tissues are within normal limits. Surgical changes, devices, tubes and lines: Thoracic spinal fixation hardware noted. IMPRESSION: Nonobstructive bowel gas pattern. Mild-to-moderate stool burden  mainly overlying the rectum/sigmoid colon region. Electronically Signed   By: Ree Molt M.D.   On: 05/12/2024 10:54   CT CHEST ABDOMEN PELVIS WO CONTRAST Result Date: 05/11/2024 CLINICAL DATA:  Sepsis Shortness of Breath; Facial Swelling Mental status change, unknown cause Best possible images due to pt condition EXAM: CT CHEST, ABDOMEN AND PELVIS WITHOUT CONTRAST TECHNIQUE: Multidetector CT imaging of the chest, abdomen and pelvis was performed following the standard protocol without IV contrast. RADIATION DOSE REDUCTION: This exam was performed according to the departmental dose-optimization program which includes automated exposure control, adjustment of the mA and/or kV according to patient size and/or use of iterative reconstruction technique. COMPARISON:  CT abdomen pelvis 05/05/2024, CT chest 05/05/2024 FINDINGS: CT CHEST FINDINGS Cardiovascular: Normal heart size. No significant pericardial effusion. The thoracic aorta is normal in caliber. Mild atherosclerotic plaque of the thoracic aorta. Left anterior descending coronary artery calcifications. Mediastinum/Nodes: No gross hilar adenopathy, noting limited sensitivity for the detection of hilar adenopathy on this noncontrast study.  no enlarged mediastinal or axillary lymph nodes. Thyroid gland and esophagus demonstrate no significant findings. Interval development of debris/mucous within the right mainstem bronchi measuring up to 1.3 cm. Lungs/Pleura: Limited evaluation due to noncontrast study and motion artifact. Bilateral lower lobes consolidations. No pulmonary nodule. No pulmonary mass. Redemonstration of bilateral small, left greater than right, pleural effusions with pleural thickening. Pleural effusion. No pneumothorax. Musculoskeletal: Bilateral gynecomastia. No suspicious lytic or blastic osseous lesions. No acute displaced fracture. Degenerative changes of the cervical spine at the C5-C6 level. CT ABDOMEN PELVIS FINDINGS Hepatobiliary: No  focal liver abnormality. No gallstones, gallbladder wall thickening, or pericholecystic fluid. No biliary dilatation. Pancreas: Diffusely atrophic. No focal lesion. Otherwise normal pancreatic contour. No surrounding inflammatory changes. No main pancreatic ductal dilatation. Spleen: Normal in size without focal abnormality. Adrenals/Urinary Tract: No adrenal nodule bilaterally. No nephrolithiasis and no hydronephrosis. No definite contour-deforming renal mass. No ureterolithiasis or hydroureter. The urinary bladder is decompressed with suprapubic catheter terminating within the lumen. Stomach/Bowel: Stomach is within normal limits. No evidence of bowel wall thickening or dilatation. Stool within the rectosigmoid colon. Appendix appears normal. Vascular/Lymphatic: No abdominal aorta or iliac aneurysm. Moderate atherosclerotic plaque of the aorta and its branches. No abdominal, pelvic, or inguinal lymphadenopathy. Reproductive: Prostate is unremarkable. Other: No intraperitoneal free fluid. No intraperitoneal free gas. No organized fluid collection. Musculoskeletal: At least small fat containing umbilical hernia with an abdominal defect of 2.7 cm. Subcutaneus soft tissue edema. No suspicious lytic or blastic osseous lesions. No acute displaced fracture. Stable chronic T12 and L1 compression fractures. Thoracolumbar posterolateral surgical fusion surgical hardware. T8 and T9 corpectomy. IMPRESSION: 1. Interval development of debris within the right mainstem bronchi measuring up to 1.3 cm. 2. Bilateral lower lobe consolidations with limited evaluation on this noncontrast study. Finding may represent infection/inflammation versus atelectasis. 3. Redemonstration of bilateral small, left greater than right, pleural effusions with pleural thickening. 4. No acute intra-abdominal or intrapelvic abnormality. 5. Limited evaluation due to noncontrast study. 6.  Aortic Atherosclerosis (ICD10-I70.0). Electronically Signed   By:  Morgane  Naveau M.D.   On: 05/11/2024 21:21   CT HEAD WO CONTRAST ( ) Result Date: 05/11/2024 CLINICAL DATA:  Mental status change, unknown cause EXAM: CT HEAD WITHOUT CONTRAST TECHNIQUE: Contiguous axial images were obtained from the base of the skull through the vertex without intravenous contrast. RADIATION DOSE REDUCTION: This exam was performed according to the departmental dose-optimization program which includes automated exposure control, adjustment of the mA and/or kV according to patient size and/or use of iterative reconstruction technique. COMPARISON:  None Available. FINDINGS: Brain: No evidence of acute infarction, hemorrhage, hydrocephalus, extra-axial collection or mass lesion/mass effect. Vascular: No hyperdense vessel. Skull: No acute fracture. Sinuses/Orbits: Clear sinuses.  No acute orbital findings. Other: No mastoid effusions. IMPRESSION: No evidence of acute intracranial abnormality. Electronically Signed   By: Gilmore GORMAN Molt M.D.   On: 05/11/2024 21:17   DG Chest Port 1 View Result Date: 05/11/2024 CLINICAL DATA:  Pleural effusion EXAM: PORTABLE CHEST 1 VIEW COMPARISON:  05/08/2019 FINDINGS: Cardiac shadow is stable. Postsurgical change in the thoracic spine is noted. Left-sided pleural effusion is noted. No sizable right effusion is noted. Mild right basilar atelectasis is seen. IMPRESSION: Left-sided pleural effusion. Right basilar atelectasis. Electronically Signed   By: Oneil Devonshire M.D.   On: 05/11/2024 10:38   DG Chest Port 1 View Result Date: 05/07/2024 CLINICAL DATA:  Atelectasis EXAM: PORTABLE CHEST 1 VIEW COMPARISON:  Chest x-ray 05/05/2024 FINDINGS: Small pleural effusions and bibasilar opacities have  not significantly changed. The heart is enlarged, unchanged. There is no pneumothorax or acute fracture. Thoracolumbar fusion hardware is again seen. IMPRESSION: Stable small pleural effusions and bibasilar atelectasis/airspace disease. Electronically Signed   By: Greig Pique M.D.   On: 05/07/2024 20:33   US  THORACENTESIS ASP PLEURAL SPACE W/IMG GUIDE Result Date: 05/07/2024 INDICATION: 63 year old male with a history of CKD who presented to the ED with facial swelling and progressive dyspnea with wheezing and labored breathing. Imaging with small left pleural effusion. Request for diagnostic and therapeutic thoracentesis. EXAM: ULTRASOUND GUIDED left THORACENTESIS MEDICATIONS: 1% lidocaine , 8 mL. COMPLICATIONS: None immediate. PROCEDURE: An ultrasound guided thoracentesis was thoroughly discussed with the patient and questions answered. The benefits, risks, alternatives and complications were also discussed. The patient understands and wishes to proceed with the procedure. Written consent was obtained. Ultrasound was performed to localize and mark an adequate pocket of fluid in the left chest. The area was then prepped and draped in the normal sterile fashion. 1% Lidocaine  was used for local anesthesia. Under ultrasound guidance a 6 Fr Safe-T-Centesis catheter was introduced. Thoracentesis was performed. The catheter was removed and a dressing applied. FINDINGS: A total of approximately 100 mL of serous fluid was removed. Samples were sent to the laboratory as requested by the clinical team. IMPRESSION: Successful ultrasound guided right thoracentesis yielding 100 mL of pleural fluid. Procedure performed by: Sherrilee Bal, PA-C under the supervision of Dr. KANDICE Moan Electronically Signed   By: Marcey Moan M.D.   On: 05/07/2024 07:47   CT ABDOMEN PELVIS WO CONTRAST Result Date: 05/05/2024 CLINICAL DATA:  Bowel obstruction suspected give oral contrast please Ty EXAM: CT ABDOMEN AND PELVIS WITHOUT CONTRAST TECHNIQUE: Multidetector CT imaging of the abdomen and pelvis was performed following the standard protocol without IV contrast. RADIATION DOSE REDUCTION: This exam was performed according to the departmental dose-optimization program which includes automated  exposure control, adjustment of the mA and/or kV according to patient size and/or use of iterative reconstruction technique. COMPARISON:  CT chest 05/05/2024 FINDINGS: Lower chest: Bilateral trace to small pleural effusions with associated pleural thickening. Bilateral lower lobe consolidation. Hepatobiliary: No focal liver abnormality. No gallstones, gallbladder wall thickening, or pericholecystic fluid. No biliary dilatation. Pancreas: Diffusely atrophic. Vaguely hazy contour of the proximal pancreas with associated mild fat stranding. No main pancreatic ductal dilatation. Spleen: Normal in size without focal abnormality. Adrenals/Urinary Tract: No adrenal nodule bilaterally. No nephrolithiasis and no hydronephrosis. No definite contour-deforming renal mass. No ureterolithiasis or hydroureter. Suprapubic catheter with tip and inflated balloon terminating within the inflated urinary bladder lumen. The urinary bladder is grossly unremarkable. Stomach/Bowel: Stomach is within normal limits. No evidence of bowel wall thickening or dilatation. Appendix appears normal. Vascular/Lymphatic: No abdominal aorta or iliac aneurysm. Moderate atherosclerotic plaque of the aorta and its branches. No abdominal, pelvic, or inguinal lymphadenopathy. Reproductive: Prostate is unremarkable. Other: No intraperitoneal free fluid. No intraperitoneal free gas. No organized fluid collection. Musculoskeletal: Small fat containing umbilical hernia. Diffuse subcutaneus soft tissue edema. Bilateral gynecomastia. No suspicious lytic or blastic osseous lesions. No acute displaced fracture. Stable chronic T12 and L1 compression fractures. Thoracic spine partially visualized posterolateral pleural surgical fusion and rods. T8 and T9 corpectomy. IMPRESSION: 1. Diffusely atrophic pancreas with question proximal acute pancreatitis. Correlate with lipase levels. 2. Diffuse subcutaneus soft tissue edema. 3. Small fat containing umbilical hernia. 4.   Aortic Atherosclerosis (ICD10-I70.0). 5. Bilateral trace to small pleural effusions with associated pleural thickening. Bilateral lower lobe consolidation-finding may represent infection/inflammation versus  atelectasis. Limited evaluation on this noncontrast study. Please see separately dictated CT chest 05/05/2024. Electronically Signed   By: Morgane  Naveau M.D.   On: 05/05/2024 19:13   DG Chest Port 1 View Result Date: 05/05/2024 CLINICAL DATA:  Left-sided thoracentesis EXAM: PORTABLE CHEST 1 VIEW COMPARISON:  05/04/2024, CT chest 05/05/2024, 05/02/2024 FINDINGS: Posterior spinal rods. Stable enlarged cardiomediastinal silhouette. Small pleural effusions slightly diminished on the left. Persistent bibasilar opacities. No definitive pneumothorax. IMPRESSION: 1. Small pleural effusions slightly diminished on the left. No definitive pneumothorax. 2. Otherwise no significant change compared to yesterday's radiograph Electronically Signed   By: Luke Bun M.D.   On: 05/05/2024 16:52   CT CHEST WO CONTRAST Result Date: 05/05/2024 CLINICAL DATA:  Acute onset dyspnea with associated facial swelling and associated wheezing. Multiple medical problems. EXAM: CT CHEST WITHOUT CONTRAST TECHNIQUE: Multidetector CT imaging of the chest was performed following the standard protocol without IV contrast. RADIATION DOSE REDUCTION: This exam was performed according to the departmental dose-optimization program which includes automated exposure control, adjustment of the mA and/or kV according to patient size and/or use of iterative reconstruction technique. COMPARISON:  Chest radiographs, most recent 05/04/2024. Chest CT, 04/21/2024 FINDINGS: Cardiovascular: Heart is normal in size configuration. Three-vessel coronary artery calcifications. No pericardial effusion. Mildly enlarged main pulmonary artery, 3.5 cm. Aorta normal in caliber. Mild aortic atherosclerotic calcifications. Mediastinum/Nodes: No neck base, mediastinal  or hilar masses. Shotty neck base mediastinal lymph nodes, all subcentimeter with the exception of a subcarinal node measuring 1.6 cm in short axis. These are stable from the prior CT. Trachea esophagus are unremarkable. Lungs/Pleura: Small left and trace right loculated appearing posterior pleural effusions. There is associated rounded lower lobe left upper lobe lingula lung opacity consistent with atelectasis. Additional lung opacity, also consistent with atelectasis, projects along oblique fissures, greater on the left. There is bilateral interstitial thickening. These findings are stable from the prior CT. No pneumothorax. Upper Abdomen: No acute findings. Musculoskeletal: Previous fractures of T8-T9. Interbody fusion device extends from the lower endplate of T7 to the upper endplate of T10, well positioned and stable. There are well positioned and stable pedicle screws interconnecting rods spanning from T5 through T12. Mild old compression deformity of L1. No osteoblastic or osteolytic lesions. There is diffuse subcutaneous soft tissue edema along the chest and visualized upper abdomen. IMPRESSION: 1. No significant change from the recent prior chest CT. 2. Persistent small left and trace right loculated pleural effusions. Persistent rounded lung opacity in the lower lobes suspected be atelectasis, also noted the left upper lobe lingula and abutting the oblique fissures. 3. Persistent bilateral interstitial thickening suspected to be interstitial pulmonary edema. There is associated diffuse subcutaneous soft tissue edema. Aortic Atherosclerosis (ICD10-I70.0). Electronically Signed   By: Alm Parkins M.D.   On: 05/05/2024 09:41   DG Chest Port 1 View Result Date: 05/04/2024 CLINICAL DATA:  Shortness of breath EXAM: PORTABLE CHEST 1 VIEW COMPARISON:  Radiograph 05/02/2024, CT 04/21/2024 FINDINGS: Stable heart size and mediastinal contours. Bibasilar opacities and pleural effusions, questionable improvement on  the left. No new consolidation. No pneumothorax. Thoracic spinal fusion hardware. IMPRESSION: Bibasilar opacities and pleural effusions, questionable improvement on the left. Electronically Signed   By: Andrea Gasman M.D.   On: 05/04/2024 23:11   DG Chest Portable 1 View Result Date: 05/02/2024 CLINICAL DATA:  acute on chronic hypoxia EXAM: PORTABLE CHEST - 1 VIEW COMPARISON:  April 19, 2024 FINDINGS: Similar small bilateral pleural effusions with adjacent pleural thickening. Patchy airspace consolidation  in the mid and lower lung zones bilaterally, unchanged, possibly atelectasis. No pneumothorax. Moderate cardiomegaly. Tortuous aorta with aortic atherosclerosis. No acute fracture or destructive lesions. Multilevel thoracic osteophytosis. Similar thoracic fusion hardware. IMPRESSION: Overall, unchanged appearance of the bilateral pleural effusions and mid and lower lung airspace disease, likely atelectasis. Electronically Signed   By: Rogelia Myers M.D.   On: 05/02/2024 20:25    Microbiology: Results for orders placed or performed during the hospital encounter of 05/21/24  Blood culture (routine x 2)     Status: None   Collection Time: 05/21/24 11:23 PM   Specimen: BLOOD  Result Value Ref Range Status   Specimen Description   Final    BLOOD RIGHT ANTECUBITAL Performed at [facility], [address], [place], [place] [address]    Special Requests   Final    BOTTLES DRAWN AEROBIC AND ANAEROBIC Blood Culture adequate volume Performed at [facility], [address] [place], [place] [address]    Culture   Final    NO GROWTH 5 DAYS Performed at [facility], [address] [place], [place] [address]    Report Status 05/27/2024 FINAL  Final  Blood culture (routine x 2)     Status: None   Collection Time: 05/21/24 11:23 PM   Specimen: BLOOD  Result Value Ref Range Status   Specimen Description   Final    BLOOD LEFT ANTECUBITAL Performed at [facility], [address] [place], [place] [address]    Special Requests   Final    BOTTLES DRAWN AEROBIC AND ANAEROBIC Blood Culture results may not be optimal due to an inadequate volume of blood received in culture bottles Performed at [facility], [address], [place], [place] [address]    Culture   Final    NO GROWTH 5 DAYS Performed at [facility], [address], [place], [place] [address]    Report Status 05/27/2024 FINAL  Final    Labs: CBC: Recent Labs  Lab 05/21/24 2323 05/22/24 1718 05/23/24 0819 05/24/24 0642 05/25/24 0455 05/26/24 0812 05/27/24 0504  WBC 6.0   < > 6.6 5.2 3.7* 4.4 4.5  NEUTROABS 3.8  --   --   --   --  2.4  --   HGB 8.7*   < > 7.8* 7.9* 8.2* 8.1* 8.5*  HCT 27.9*   < > 25.5* 26.1* 26.8* 25.7* 26.6*  MCV 94.9   < > 95.5 95.6 95.0 92.1 92.4  PLT 218   < > 220 148* 133* 174 194   < > = values in this interval not displayed.   Basic Metabolic Panel: Recent Labs  Lab 05/23/24 0819 05/24/24 0642 05/25/24 0455 05/26/24 0812 05/27/24 0504  NA 138 138 137 140 138  K 5.7* 5.0 5.0 4.2 3.5  CL 106 107 107 102 99  CO2 23 24 23 28 29   GLUCOSE 141* 167* 182* 156* 165*  BUN 52* 45* 42* 28* 18  CREATININE 4.78* 4.07* 3.88* 3.04* 2.68*  CALCIUM  7.2* 7.0* 6.9* 7.1* 7.2*  PHOS 6.3*  --   --  3.3  --    Liver Function Tests: Recent Labs  Lab 05/21/24 2323 05/23/24 0819 05/26/24 0812  AST 16  --   --   ALT 14  --   --   ALKPHOS 49  --   --   BILITOT 0.4  --   --   PROT 5.6*  --   --  ALBUMIN  1.8* 1.7* 1.7*   CBG: Recent Labs  Lab 05/27/24 1740 05/27/24 1934 05/28/24 0025 05/28/24 0510 05/28/24 0734  GLUCAP 171* 203* 162* 138* 146*    Discharge time spent: greater than 30 minutes.  This record has been created using Conservation officer, historic buildings. Errors have been sought and corrected,but may not always be located. Such creation errors do not reflect on the standard of care.   Signed: Amaryllis Dare, MD [facility] 05/28/2024

## 2024-05-28 NOTE — Progress Notes (Signed)
 Eugene [name] to be D/C'd Skilled nursing facility per MD order.  Discussed with the patient and all questions fully answered.  VSS, Skin clean, dry and intact without evidence of skin break down, no evidence of skin tears noted. IV catheter discontinued intact. Site without signs and symptoms of complications. Dressing and pressure applied.  An After Visit Summary, signed prescription, and signed DNR form placed in packet and given to [facility] transportation for receiving facility.  Report called to [name], receiving RN at [facility].  Patient instructed to return to ED, call 911, or call MD for any changes in condition.   Patient escorted via strecther, and D/C to [facility] via non emergency ambulance.  [name] 05/28/2024 5:08 PM

## 2024-06-22 ENCOUNTER — Encounter: Payer: Self-pay | Admitting: Internal Medicine

## 2024-06-22 ENCOUNTER — Emergency Department

## 2024-06-22 ENCOUNTER — Inpatient Hospital Stay
Admission: EM | Admit: 2024-06-22 | Discharge: 2024-06-30 | DRG: 871 | Disposition: A | Discharge status: Skilled Nursing Facility | Source: Skilled Nursing Facility

## 2024-06-22 DIAGNOSIS — K5900 Constipation, unspecified: Secondary | ICD-10-CM | POA: Diagnosis present

## 2024-06-22 DIAGNOSIS — E1122 Type 2 diabetes mellitus with diabetic chronic kidney disease: Secondary | ICD-10-CM | POA: Diagnosis present

## 2024-06-22 DIAGNOSIS — F17200 Nicotine dependence, unspecified, uncomplicated: Secondary | ICD-10-CM | POA: Diagnosis present

## 2024-06-22 DIAGNOSIS — Z89612 Acquired absence of left leg above knee: Secondary | ICD-10-CM | POA: Diagnosis not present

## 2024-06-22 DIAGNOSIS — Z1152 Encounter for screening for COVID-19: Secondary | ICD-10-CM | POA: Diagnosis not present

## 2024-06-22 DIAGNOSIS — K3184 Gastroparesis: Secondary | ICD-10-CM | POA: Diagnosis present

## 2024-06-22 DIAGNOSIS — Z89611 Acquired absence of right leg above knee: Secondary | ICD-10-CM

## 2024-06-22 DIAGNOSIS — Z992 Dependence on renal dialysis: Secondary | ICD-10-CM | POA: Diagnosis not present

## 2024-06-22 DIAGNOSIS — R601 Generalized edema: Secondary | ICD-10-CM

## 2024-06-22 DIAGNOSIS — Z981 Arthrodesis status: Secondary | ICD-10-CM

## 2024-06-22 DIAGNOSIS — L89152 Pressure ulcer of sacral region, stage 2: Secondary | ICD-10-CM | POA: Diagnosis present

## 2024-06-22 DIAGNOSIS — N319 Neuromuscular dysfunction of bladder, unspecified: Secondary | ICD-10-CM | POA: Diagnosis present

## 2024-06-22 DIAGNOSIS — J9621 Acute and chronic respiratory failure with hypoxia: Secondary | ICD-10-CM | POA: Diagnosis present

## 2024-06-22 DIAGNOSIS — J440 Chronic obstructive pulmonary disease with acute lower respiratory infection: Secondary | ICD-10-CM | POA: Diagnosis present

## 2024-06-22 DIAGNOSIS — I132 Hypertensive heart and chronic kidney disease with heart failure and with stage 5 chronic kidney disease, or end stage renal disease: Secondary | ICD-10-CM | POA: Diagnosis present

## 2024-06-22 DIAGNOSIS — D631 Anemia in chronic kidney disease: Secondary | ICD-10-CM | POA: Diagnosis present

## 2024-06-22 DIAGNOSIS — Z8419 Family history of other disorders of kidney and ureter: Secondary | ICD-10-CM

## 2024-06-22 DIAGNOSIS — I2489 Other forms of acute ischemic heart disease: Secondary | ICD-10-CM | POA: Diagnosis present

## 2024-06-22 DIAGNOSIS — E1169 Type 2 diabetes mellitus with other specified complication: Secondary | ICD-10-CM | POA: Diagnosis present

## 2024-06-22 DIAGNOSIS — Z6841 Body Mass Index (BMI) 40.0 and over, adult: Secondary | ICD-10-CM

## 2024-06-22 DIAGNOSIS — Z66 Do not resuscitate: Secondary | ICD-10-CM | POA: Diagnosis present

## 2024-06-22 DIAGNOSIS — N186 End stage renal disease: Secondary | ICD-10-CM | POA: Diagnosis present

## 2024-06-22 DIAGNOSIS — N049 Nephrotic syndrome with unspecified morphologic changes: Secondary | ICD-10-CM

## 2024-06-22 DIAGNOSIS — J42 Unspecified chronic bronchitis: Secondary | ICD-10-CM | POA: Diagnosis not present

## 2024-06-22 DIAGNOSIS — Z794 Long term (current) use of insulin: Secondary | ICD-10-CM

## 2024-06-22 DIAGNOSIS — E039 Hypothyroidism, unspecified: Secondary | ICD-10-CM | POA: Diagnosis present

## 2024-06-22 DIAGNOSIS — R4182 Altered mental status, unspecified: Secondary | ICD-10-CM | POA: Diagnosis present

## 2024-06-22 DIAGNOSIS — F17210 Nicotine dependence, cigarettes, uncomplicated: Secondary | ICD-10-CM | POA: Diagnosis present

## 2024-06-22 DIAGNOSIS — I10 Essential (primary) hypertension: Secondary | ICD-10-CM | POA: Diagnosis present

## 2024-06-22 DIAGNOSIS — E1143 Type 2 diabetes mellitus with diabetic autonomic (poly)neuropathy: Secondary | ICD-10-CM | POA: Diagnosis present

## 2024-06-22 DIAGNOSIS — J189 Pneumonia, unspecified organism: Secondary | ICD-10-CM | POA: Diagnosis present

## 2024-06-22 DIAGNOSIS — Z7984 Long term (current) use of oral hypoglycemic drugs: Secondary | ICD-10-CM

## 2024-06-22 DIAGNOSIS — R6520 Severe sepsis without septic shock: Secondary | ICD-10-CM | POA: Diagnosis present

## 2024-06-22 DIAGNOSIS — A419 Sepsis, unspecified organism: Principal | ICD-10-CM | POA: Diagnosis present

## 2024-06-22 DIAGNOSIS — E785 Hyperlipidemia, unspecified: Secondary | ICD-10-CM | POA: Diagnosis present

## 2024-06-22 DIAGNOSIS — E66813 Obesity, class 3: Secondary | ICD-10-CM | POA: Diagnosis present

## 2024-06-22 DIAGNOSIS — J90 Pleural effusion, not elsewhere classified: Secondary | ICD-10-CM | POA: Diagnosis present

## 2024-06-22 DIAGNOSIS — F419 Anxiety disorder, unspecified: Secondary | ICD-10-CM | POA: Diagnosis present

## 2024-06-22 DIAGNOSIS — Z79899 Other long term (current) drug therapy: Secondary | ICD-10-CM

## 2024-06-22 DIAGNOSIS — R7989 Other specified abnormal findings of blood chemistry: Secondary | ICD-10-CM

## 2024-06-22 DIAGNOSIS — I5032 Chronic diastolic (congestive) heart failure: Secondary | ICD-10-CM | POA: Diagnosis present

## 2024-06-22 DIAGNOSIS — F32A Depression, unspecified: Secondary | ICD-10-CM | POA: Diagnosis present

## 2024-06-22 DIAGNOSIS — K219 Gastro-esophageal reflux disease without esophagitis: Secondary | ICD-10-CM | POA: Diagnosis present

## 2024-06-22 DIAGNOSIS — E875 Hyperkalemia: Secondary | ICD-10-CM | POA: Diagnosis present

## 2024-06-22 DIAGNOSIS — Z59868 Other specified financial insecurity: Secondary | ICD-10-CM

## 2024-06-22 DIAGNOSIS — G894 Chronic pain syndrome: Secondary | ICD-10-CM | POA: Diagnosis present

## 2024-06-22 DIAGNOSIS — E1165 Type 2 diabetes mellitus with hyperglycemia: Secondary | ICD-10-CM

## 2024-06-22 DIAGNOSIS — Z79890 Hormone replacement therapy (postmenopausal): Secondary | ICD-10-CM

## 2024-06-22 LAB — CBC WITH DIFFERENTIAL/PLATELET
Abs Immature Granulocytes: 0.17 K/uL — ABNORMAL HIGH (ref 0.00–0.07)
Basophils Absolute: 0.1 K/uL (ref 0.0–0.1)
Basophils Relative: 1 %
Eosinophils Absolute: 0 K/uL (ref 0.0–0.5)
Eosinophils Relative: 0 %
HCT: 31.7 % — ABNORMAL LOW (ref 39.0–52.0)
Hemoglobin: 9.7 g/dL — ABNORMAL LOW (ref 13.0–17.0)
Immature Granulocytes: 1 %
Lymphocytes Relative: 15 %
Lymphs Abs: 2.2 K/uL (ref 0.7–4.0)
MCH: 29.5 pg (ref 26.0–34.0)
MCHC: 30.6 g/dL (ref 30.0–36.0)
MCV: 96.4 fL (ref 80.0–100.0)
Monocytes Absolute: 1.3 K/uL — ABNORMAL HIGH (ref 0.1–1.0)
Monocytes Relative: 9 %
Neutro Abs: 11.4 K/uL — ABNORMAL HIGH (ref 1.7–7.7)
Neutrophils Relative %: 74 %
Platelets: 214 K/uL (ref 150–400)
RBC: 3.29 MIL/uL — ABNORMAL LOW (ref 4.22–5.81)
RDW: 14.4 % (ref 11.5–15.5)
WBC: 15.1 K/uL — ABNORMAL HIGH (ref 4.0–10.5)
nRBC: 0 % (ref 0.0–0.2)

## 2024-06-22 LAB — COMPREHENSIVE METABOLIC PANEL WITH GFR
ALT: 102 U/L — ABNORMAL HIGH (ref 0–44)
AST: 114 U/L — ABNORMAL HIGH (ref 15–41)
Albumin: 2.4 g/dL — ABNORMAL LOW (ref 3.5–5.0)
Alkaline Phosphatase: 70 U/L (ref 38–126)
Anion gap: 14 (ref 5–15)
BUN: 63 mg/dL — ABNORMAL HIGH (ref 8–23)
CO2: 20 mmol/L — ABNORMAL LOW (ref 22–32)
Calcium: 7.7 mg/dL — ABNORMAL LOW (ref 8.9–10.3)
Chloride: 99 mmol/L (ref 98–111)
Creatinine, Ser: 6.07 mg/dL — ABNORMAL HIGH (ref 0.61–1.24)
GFR, Estimated: 10 mL/min — ABNORMAL LOW (ref >60)
Glucose, Bld: 335 mg/dL — ABNORMAL HIGH (ref 70–99)
Potassium: 5.4 mmol/L — ABNORMAL HIGH (ref 3.5–5.1)
Sodium: 133 mmol/L — ABNORMAL LOW (ref 135–145)
Total Bilirubin: 0.6 mg/dL (ref 0.0–1.2)
Total Protein: 7 g/dL (ref 6.5–8.1)

## 2024-06-22 LAB — BLOOD GAS, VENOUS
Acid-base deficit: 3.5 mmol/L — ABNORMAL HIGH (ref 0.0–2.0)
Bicarbonate: 24.6 mmol/L (ref 20.0–28.0)
O2 Saturation: 30.7 %
Patient temperature: 37
pCO2, Ven: 56 mmHg (ref 44–60)
pH, Ven: 7.25 (ref 7.25–7.43)

## 2024-06-22 LAB — BRAIN NATRIURETIC PEPTIDE: B Natriuretic Peptide: 669.6 pg/mL — ABNORMAL HIGH (ref 0.0–100.0)

## 2024-06-22 LAB — URINALYSIS, ROUTINE W REFLEX MICROSCOPIC
Bilirubin Urine: NEGATIVE
Glucose, UA: 50 mg/dL — AB
Hgb urine dipstick: NEGATIVE
Ketones, ur: NEGATIVE mg/dL
Nitrite: NEGATIVE
Protein, ur: 100 mg/dL — AB
Specific Gravity, Urine: 1.027 (ref 1.005–1.030)
WBC, UA: >50 WBC/hpf (ref 0–5)
pH: 6 (ref 5.0–8.0)

## 2024-06-22 LAB — RESP PANEL BY RT-PCR (RSV, FLU A&B, COVID)  RVPGX2
Influenza A by PCR: NEGATIVE
Influenza B by PCR: NEGATIVE
Resp Syncytial Virus by PCR: NEGATIVE
SARS Coronavirus 2 by RT PCR: NEGATIVE

## 2024-06-22 LAB — LACTIC ACID, PLASMA
Lactic Acid, Venous: 1.3 mmol/L (ref 0.5–1.9)
Lactic Acid, Venous: 1.1 mmol/L (ref 0.5–1.9)

## 2024-06-22 LAB — HEPATITIS B SURFACE ANTIGEN: Hepatitis B Surface Ag: NONREACTIVE

## 2024-06-22 LAB — APTT: aPTT: 114 s — ABNORMAL HIGH (ref 24–36)

## 2024-06-22 LAB — TROPONIN I (HIGH SENSITIVITY)
Troponin I (High Sensitivity): 87 ng/L — ABNORMAL HIGH (ref <18)
Troponin I (High Sensitivity): 196 ng/L (ref <18)

## 2024-06-22 LAB — PROTIME-INR
INR: 1.1 (ref 0.8–1.2)
Prothrombin Time: 14.5 s (ref 11.4–15.2)

## 2024-06-22 LAB — PHOSPHORUS: Phosphorus: 7.1 mg/dL — ABNORMAL HIGH (ref 2.5–4.6)

## 2024-06-22 LAB — MAGNESIUM: Magnesium: 2.5 mg/dL — ABNORMAL HIGH (ref 1.7–2.4)

## 2024-06-22 MED ORDER — CHLORHEXIDINE GLUCONATE CLOTH 2 % EX PADS
6.0000 | MEDICATED_PAD | Freq: Every day | CUTANEOUS | Status: DC
  Administered 2024-06-23 – 2024-06-29 (×5): 6 via TOPICAL
  Filled 2024-06-22: qty 6

## 2024-06-22 MED ORDER — CARVEDILOL 6.25 MG PO TABS
6.2500 mg | ORAL_TABLET | Freq: Two times a day (BID) | ORAL | Status: DC
  Administered 2024-06-23 – 2024-06-30 (×13): 6.25 mg via ORAL
  Filled 2024-06-22 (×13): qty 1

## 2024-06-22 MED ORDER — SODIUM CHLORIDE 0.9 % IV SOLN
500.0000 mg | Freq: Once | INTRAVENOUS | Status: AC
  Administered 2024-06-22: 500 mg via INTRAVENOUS
  Filled 2024-06-22: qty 5

## 2024-06-22 MED ORDER — DILTIAZEM HCL ER COATED BEADS 120 MG PO CP24
240.0000 mg | ORAL_CAPSULE | Freq: Every evening | ORAL | Status: DC
  Administered 2024-06-22 – 2024-06-29 (×8): 240 mg via ORAL
  Filled 2024-06-22 (×5): qty 2
  Filled 2024-06-22 (×4): qty 1

## 2024-06-22 MED ORDER — NICOTINE 21 MG/24HR TD PT24
21.0000 mg | MEDICATED_PATCH | Freq: Every day | TRANSDERMAL | Status: AC | PRN
  Administered 2024-06-23 – 2024-06-26 (×4): 21 mg via TRANSDERMAL
  Filled 2024-06-22 (×4): qty 1

## 2024-06-22 MED ORDER — VANCOMYCIN HCL 1500 MG/300ML IV SOLN
1500.0000 mg | Freq: Once | INTRAVENOUS | Status: AC
  Administered 2024-06-22: 1500 mg via INTRAVENOUS
  Filled 2024-06-22: qty 300

## 2024-06-22 MED ORDER — ONDANSETRON HCL 4 MG/2ML IJ SOLN
4.0000 mg | Freq: Four times a day (QID) | INTRAMUSCULAR | Status: AC | PRN
  Administered 2024-06-26: 4 mg via INTRAVENOUS

## 2024-06-22 MED ORDER — ACETAMINOPHEN 650 MG RE SUPP: 650.0000 mg | Freq: Four times a day (QID) | RECTAL | Status: AC | PRN

## 2024-06-22 MED ORDER — SENNOSIDES-DOCUSATE SODIUM 8.6-50 MG PO TABS
1.0000 | ORAL_TABLET | Freq: Every evening | ORAL | Status: DC | PRN
  Administered 2024-06-25: 1 via ORAL
  Filled 2024-06-22: qty 1

## 2024-06-22 MED ORDER — INSULIN GLARGINE-YFGN 100 UNIT/ML ~~LOC~~ SOLN: 10.0000 [IU] | Freq: Two times a day (BID) | SUBCUTANEOUS | Status: DC

## 2024-06-22 MED ORDER — VANCOMYCIN HCL IN DEXTROSE 1-5 GM/200ML-% IV SOLN
1000.0000 mg | Freq: Once | INTRAVENOUS | Status: DC
Start: 2024-06-22 — End: 2024-06-22

## 2024-06-22 MED ORDER — SODIUM CHLORIDE 0.9 % IV SOLN
500.0000 mg | INTRAVENOUS | Status: DC
  Administered 2024-06-23 – 2024-06-25 (×3): 500 mg via INTRAVENOUS
  Filled 2024-06-22 (×3): qty 5

## 2024-06-22 MED ORDER — SODIUM CHLORIDE 0.9 % IV SOLN
2.0000 g | INTRAVENOUS | Status: AC
  Administered 2024-06-23 – 2024-06-26 (×4): 2 g via INTRAVENOUS
  Filled 2024-06-22 (×4): qty 20

## 2024-06-22 MED ORDER — SODIUM CHLORIDE 0.9 % IV SOLN
2.0000 g | Freq: Once | INTRAVENOUS | Status: AC
  Administered 2024-06-22: 2 g via INTRAVENOUS
  Filled 2024-06-22: qty 12.5

## 2024-06-22 MED ORDER — MELATONIN 5 MG PO TABS
10.0000 mg | ORAL_TABLET | Freq: Every day | ORAL | Status: DC
  Administered 2024-06-23 – 2024-06-29 (×8): 10 mg via ORAL
  Filled 2024-06-22 (×8): qty 2

## 2024-06-22 MED ORDER — HYDRALAZINE HCL 20 MG/ML IJ SOLN
5.0000 mg | Freq: Four times a day (QID) | INTRAMUSCULAR | Status: AC | PRN
  Filled 2024-06-22: qty 1

## 2024-06-22 MED ORDER — INSULIN ASPART 100 UNIT/ML IJ SOLN
0.0000 [IU] | Freq: Three times a day (TID) | INTRAMUSCULAR | Status: DC
  Administered 2024-06-23: 5 [IU] via SUBCUTANEOUS
  Administered 2024-06-23: 4 [IU] via SUBCUTANEOUS
  Filled 2024-06-22: qty 1
  Filled 2024-06-22: qty 4

## 2024-06-22 MED ORDER — INSULIN ASPART 100 UNIT/ML IJ SOLN
0.0000 [IU] | Freq: Every day | INTRAMUSCULAR | Status: DC
  Administered 2024-06-23: 5 [IU] via SUBCUTANEOUS
  Administered 2024-06-23: 3 [IU] via SUBCUTANEOUS
  Filled 2024-06-22: qty 1
  Filled 2024-06-22: qty 3

## 2024-06-22 MED ORDER — LEVOTHYROXINE SODIUM 100 MCG PO TABS
175.0000 ug | ORAL_TABLET | Freq: Every day | ORAL | Status: DC
  Administered 2024-06-23 – 2024-06-30 (×7): 175 ug via ORAL
  Filled 2024-06-22 (×2): qty 1
  Filled 2024-06-22: qty 2
  Filled 2024-06-22: qty 4
  Filled 2024-06-22 (×3): qty 1

## 2024-06-22 MED ORDER — HEPARIN (PORCINE) 25000 UT/250ML-% IV SOLN
1900.0000 [IU]/h | INTRAVENOUS | Status: DC
  Administered 2024-06-22: 1400 [IU]/h via INTRAVENOUS
  Administered 2024-06-23: 1900 [IU]/h via INTRAVENOUS
  Filled 2024-06-22 (×3): qty 250

## 2024-06-22 MED ORDER — ONDANSETRON HCL 4 MG PO TABS: 4.0000 mg | ORAL_TABLET | Freq: Four times a day (QID) | ORAL | Status: AC | PRN

## 2024-06-22 MED ORDER — VANCOMYCIN HCL IN DEXTROSE 1-5 GM/200ML-% IV SOLN
1000.0000 mg | Freq: Once | INTRAVENOUS | Status: AC
  Administered 2024-06-22: 1000 mg via INTRAVENOUS
  Filled 2024-06-22: qty 200

## 2024-06-22 MED ORDER — HEPARIN BOLUS VIA INFUSION
4000.0000 [IU] | Freq: Once | INTRAVENOUS | Status: AC
  Administered 2024-06-22: 4000 [IU] via INTRAVENOUS
  Filled 2024-06-22: qty 4000

## 2024-06-22 MED ORDER — HEPARIN SODIUM (PORCINE) 5000 UNIT/ML IJ SOLN: 5000.0000 [IU] | Freq: Three times a day (TID) | INTRAMUSCULAR | Status: DC

## 2024-06-22 MED ORDER — INSULIN GLARGINE 100 UNIT/ML ~~LOC~~ SOLN
10.0000 [IU] | Freq: Two times a day (BID) | SUBCUTANEOUS | Status: DC
  Administered 2024-06-23 (×2): 10 [IU] via SUBCUTANEOUS
  Filled 2024-06-22 (×3): qty 0.1

## 2024-06-22 MED ORDER — ACETAMINOPHEN 325 MG PO TABS: 650.0000 mg | ORAL_TABLET | Freq: Four times a day (QID) | ORAL | Status: AC | PRN

## 2024-06-22 NOTE — Assessment & Plan Note (Signed)
-   Levothyroxine 175 mcg daily resumed

## 2024-06-22 NOTE — Progress Notes (Addendum)
 PHARMACY - ANTICOAGULATION CONSULT NOTE  Pharmacy Consult for heparin  drip  Indication: chest pain/ACS/NSTEMI  No Known Allergies  Patient Measurements: Height: 5' 10 (177.8 cm) Weight: 136.1 kg (300 lb) IBW/kg (Calculated) : 73 HEPARIN  DW (KG): 104.7  Vital Signs: Temp: 98.8 F (37.1 C) (09/29 1536) Temp Source: Axillary (09/29 1536) BP: 159/79 (09/29 1700) Pulse Rate: 84 (09/29 1700)  Labs: Recent Labs    06/22/24 1553 06/22/24 1823  HGB 9.7*  --   HCT 31.7*  --   PLT 214  --   CREATININE 6.07*  --   TROPONINIHS 87* 196*    Estimated Creatinine Clearance: 17.8 mL/min (A) (by C-G formula based on SCr of 6.07 mg/dL (H)).   Medical History: Past Medical History:  Diagnosis Date   Anxiety    Depression    Diabetes mellitus without complication (HCC)    Hepatitis C    Hyperlipidemia    IBS (irritable bowel syndrome)    Osteomyelitis (HCC)    Spinal stenosis     Medications:  (Not in a hospital admission)  Scheduled:   [START ON 06/23/2024] carvedilol   6.25 mg Oral BID WC   [START ON 06/23/2024] Chlorhexidine  Gluconate Cloth  6 each Topical Q0600   diltiazem   240 mg Oral QPM   heparin   4,000 Units Intravenous Once   insulin  aspart  0-5 Units Subcutaneous QHS   [START ON 06/23/2024] insulin  aspart  0-6 Units Subcutaneous TID WC   insulin  glargine  10 Units Subcutaneous BID   [START ON 06/23/2024] levothyroxine   175 mcg Oral Q0600   melatonin  10 mg Oral QHS   Infusions:   azithromycin  500 mg (06/22/24 1845)   [START ON 06/23/2024] azithromycin      [START ON 06/23/2024] cefTRIAXone  (ROCEPHIN )  IV     heparin      vancomycin       Assessment: 63 yo M to start heparin  drip for NSTEMI.  Admitted w/ SHOB, AMS, sepsis.   Yk:fnmapi obesity, diabetes, CHF, chronic hypoxic respiratory failure on 5 to 6 L, ESRD on HD, SVT, neurogenic bladder s/p suprapubic catheter, and chronic pain syndrome  No anticoagulation PTA per med rec and Rx fill hx Baseline:  Hgb 9.7  Plt  214   INR 1.1  aPTT 114 (drawn after heparin  drip started)    Goal of Therapy:  Heparin  level 0.3-0.7 units/ml Monitor platelets by anticoagulation protocol: Yes   Plan:  Give 4000 units bolus x 1 Start heparin  infusion at 1400 units/hr Check anti-Xa level in 8 hours and daily while on heparin  Continue to monitor H&H and platelets  Allean Haas PharmD Clinical Pharmacist 06/22/2024

## 2024-06-22 NOTE — Hospital Course (Addendum)
 [name] is a 63 year old male with history of morbid obesity, end-stage renal disease on hemodialysis, insulin -dependent diabetes mellitus 2, hypothyroid, hypertension, who presents ED from nursing facility for chief concerns of shortness of breath, altered mental status.  Vitals in the ED showed tof 98.8, rr 17, hr 83, blood pressure 169/80, SpO2 93% on BiPAP.  Serum sodium is 133, potassium 5.4, chloride 99, bicarb 20, BUN of 63, serum creatinine of 6.07, eGFR of 10, nonfasting blood glucose 335, WBC 15.1, hemoglobin 9.7, as of 214.  Lactic acid was 1.3 and on repeat was 1.1.  COVID/influenza A/influenza B/RSV PCR were negative.  ED treatment: Azithromycin  500 mg IV one-time dose, ceftriaxone  2 g IV one-time dose, vancomycin  per pharmacy.  BiPAP therapy was initiated.

## 2024-06-22 NOTE — Sepsis Progress Note (Signed)
 Sepsis protocol monitored by eLink

## 2024-06-22 NOTE — ED Triage Notes (Signed)
 Pt BIB [facility] for SOB and AMS. Per EMS staff at [facility] reported patient hallucinating, O2 in the 70's, 6L [place] at home. Pt on c-pap by EMS. Pt is HD and did not have it completed today due to not feeling well.

## 2024-06-22 NOTE — ED Provider Notes (Signed)
 [facility] Provider Note    Event Date/Time   First MD Initiated Contact with Patient 06/22/24 1549     (approximate)   History   Chief Complaint Shortness of Breath   HPI  Eugene [name] is a 63 y.o. male with past medical history of diabetes, CHF, chronic hypoxic respiratory failure on 5 to 6 L, ESRD on HD, SVT, neurogenic bladder s/p suprapubic catheter, and chronic pain syndrome who presents to the ED complaining of shortness of breath.  Per EMS, patient noted to be confused and hallucinating throughout the day by staff at [facility].  When they went to check his vital signs, he was found to be hypoxic into the mid 70s on his usual supplemental oxygen.  EMS was called and he was placed on CPAP with improvement.  Patient reportedly missed dialysis today because he was not feeling well.  On arrival, patient unable to answer any questions.     Physical Exam   Triage Vital Signs: ED Triage Vitals  Encounter Vitals Group     BP --      Girls Systolic BP Percentile --      Girls Diastolic BP Percentile --      Boys Systolic BP Percentile --      Boys Diastolic BP Percentile --      Pulse Rate 06/22/24 1536 86     Resp 06/22/24 1536 (!) 22     Temp 06/22/24 1536 98.8 F (37.1 C)     Temp Source 06/22/24 1536 Axillary     SpO2 06/22/24 1535 (!) 88 %     Weight 06/22/24 1537 300 lb (136.1 kg)     Height 06/22/24 1537 5' 10 (1.778 m)     Head Circumference --      Peak Flow --      Pain Score --      Pain Loc --      Pain Education --      Exclude from Growth Chart --     Most recent vital signs: Vitals:   06/22/24 1605 06/22/24 1700  BP:  (!) 159/79  Pulse:  84  Resp:  18  Temp:    SpO2: 93% 92%    Constitutional: Somnolent but arousable to voice. Eyes: Conjunctivae are normal. Head: Atraumatic. Nose: No congestion/rhinnorhea. Mouth/Throat: Mucous membranes are moist.  Cardiovascular: Normal rate, regular rhythm. Grossly  normal heart sounds.  2+ radial pulses bilaterally. Respiratory: Tachypneic with increased respiratory effort, fine crackles noted throughout. Gastrointestinal: Soft and nontender. No distention. Musculoskeletal: Status post bilateral BKA.  3+ pitting edema in all 4 extremities. Neurologic:  No gross focal neurologic deficits are appreciated, moves all extremities equally.    ED Results / Procedures / Treatments   Labs (all labs ordered are listed, but only abnormal results are displayed) Labs Reviewed  CBC WITH DIFFERENTIAL/PLATELET - Abnormal; Notable for the following components:      Result Value   WBC 15.1 (*)    RBC 3.29 (*)    Hemoglobin 9.7 (*)    HCT 31.7 (*)    Neutro Abs 11.4 (*)    Monocytes Absolute 1.3 (*)    Abs Immature Granulocytes 0.17 (*)    All other components within normal limits  COMPREHENSIVE METABOLIC PANEL WITH GFR - Abnormal; Notable for the following components:   Sodium 133 (*)    Potassium 5.4 (*)    CO2 20 (*)    Glucose, Bld 335 (*)  BUN 63 (*)    Creatinine, Ser 6.07 (*)    Calcium  7.7 (*)    Albumin  2.4 (*)    AST 114 (*)    ALT 102 (*)    GFR, Estimated 10 (*)    All other components within normal limits  BRAIN NATRIURETIC PEPTIDE - Abnormal; Notable for the following components:   B Natriuretic Peptide 669.6 (*)    All other components within normal limits  MAGNESIUM  - Abnormal; Notable for the following components:   Magnesium  2.5 (*)    All other components within normal limits  PHOSPHORUS - Abnormal; Notable for the following components:   Phosphorus 7.1 (*)    All other components within normal limits  BLOOD GAS, VENOUS - Abnormal; Notable for the following components:   Acid-base deficit 3.5 (*)    All other components within normal limits  URINALYSIS, ROUTINE W REFLEX MICROSCOPIC - Abnormal; Notable for the following components:   Color, Urine YELLOW (*)    APPearance TURBID (*)    Glucose, UA 50 (*)    Protein, ur 100  (*)    Leukocytes,Ua MODERATE (*)    Bacteria, UA MANY (*)    All other components within normal limits  TROPONIN I (HIGH SENSITIVITY) - Abnormal; Notable for the following components:   Troponin I (High Sensitivity) 87 (*)    All other components within normal limits  RESP PANEL BY RT-PCR (RSV, FLU A&B, COVID)  RVPGX2  CULTURE, BLOOD (ROUTINE X 2)  CULTURE, BLOOD (ROUTINE X 2)  URINE CULTURE  LACTIC ACID, PLASMA  LACTIC ACID, PLASMA  HEPATITIS B SURFACE ANTIGEN  HEPATITIS B SURFACE ANTIBODY, QUANTITATIVE  TROPONIN I (HIGH SENSITIVITY)     EKG  ED ECG REPORT I, Carlin Palin, the attending physician, personally viewed and interpreted this ECG.   Date: 06/22/2024  EKG Time: 15:39  Rate: 86  Rhythm: normal sinus rhythm  Axis: Normal  Intervals:none  ST&T Change: None  RADIOLOGY CT head reviewed and interpreted by me with no hemorrhage or midline shift.  PROCEDURES:  Critical Care performed: Yes, see critical care procedure note(s)  .Critical Care  Performed by: Palin Carlin, MD Authorized by: Palin Carlin, MD   Critical care provider statement:    Critical care time (minutes):  30   Critical care time was exclusive of:  Separately billable procedures and treating other patients and teaching time   Critical care was necessary to treat or prevent imminent or life-threatening deterioration of the following conditions:  Respiratory failure   Critical care was time spent personally by me on the following activities:  Development of treatment plan with patient or surrogate, discussions with consultants, evaluation of patient's response to treatment, examination of patient, ordering and review of laboratory studies, ordering and review of radiographic studies, ordering and performing treatments and interventions, pulse oximetry, re-evaluation of patient's condition and review of old charts   I assumed direction of critical care for this patient from another provider in  my specialty: no     Care discussed with: admitting provider   .Ultrasound ED Peripheral IV (Provider)  Date/Time: 06/22/2024 3:53 PM  Performed by: Palin Carlin, MD Authorized by: Palin Carlin, MD   Procedure details:    Indications: multiple failed IV attempts and poor IV access     Skin Prep: chlorhexidine  gluconate     Location:  Left AC   Angiocath:  18 G   Bedside Ultrasound Guided: Yes     Images: not archived  Patient tolerated procedure without complications: Yes     Dressing applied: Yes      MEDICATIONS ORDERED IN ED: Medications  azithromycin  (ZITHROMAX ) 500 mg in sodium chloride  0.9 % 250 mL IVPB (has no administration in time range)  vancomycin  (VANCOCIN ) IVPB 1000 mg/200 mL premix (1,000 mg Intravenous New Bag/Given 06/22/24 1739)    Followed by  vancomycin  (VANCOREADY) IVPB 1500 mg/300 mL (has no administration in time range)  Chlorhexidine  Gluconate Cloth 2 % PADS 6 each (has no administration in time range)  ceFEPIme  (MAXIPIME ) 2 g in sodium chloride  0.9 % 100 mL IVPB (0 g Intravenous Stopped 06/22/24 1738)     IMPRESSION / MDM / ASSESSMENT AND PLAN / ED COURSE  I reviewed the triage vital signs and the nursing notes.                              63 y.o. male with past medical history of diabetes, ESRD on HD, chronic hypoxic respiratory failure on 6 L, CHF, SVT, neurogenic bladder, and chronic pain syndrome who presents to the ED for altered mental status and hypoxia noted by staff at his nursing facility.  Patient's presentation is most consistent with acute presentation with potential threat to life or bodily function.  Differential diagnosis includes, but is not limited to, pulmonary edema, hyperkalemia, intracranial process, electrolyte abnormality, hypoglycemia, sepsis, UTI, pneumonia, CHF.  Patient ill-appearing and in respiratory distress on arrival, transition to BiPAP and maintaining oxygen saturations on this.  He is somnolent but easily  arousable, somewhat able to follow commands and does appear to currently be protecting his airway.  Patient with difficult IV access, was eventually able to place ultrasound-guided IV in his left AC.  EKG shows no evidence of arrhythmia or ischemia, chest x-ray and VBG are pending.  He does appear significantly fluid overloaded and missed his dialysis treatment earlier today, unclear when his last dialysis treatment was.  Plan to reassess following results to ensure he continues to maintain his airway, anticipate admission to ICU.  CT head is negative for acute process, chest x-ray concerning for pneumonia and pulmonary edema.  Patient meets sepsis criteria with leukocytosis and elevated respiratory rate, broad-spectrum IV antibiotics were given.  VBG is reassuring without significant hypercapnia, patient's mental status seems to be improving on BiPAP.  Additional labs consistent with known ESRD, mild hyperkalemia noted as well as hypermagnesemia and hyperphosphatemia.  Case discussed with Dr. Marcelino of nephrology, who will arrange for dialysis.  Case discussed with hospitalist for admission.      FINAL CLINICAL IMPRESSION(S) / ED DIAGNOSES   Final diagnoses:  Acute on chronic respiratory failure with hypoxia (HCC)  Altered mental status, unspecified altered mental status type  Pneumonia due to infectious organism, unspecified laterality, unspecified part of lung  Anasarca     Rx / DC Orders   ED Discharge Orders     None        Note:  This document was prepared using Dragon voice recognition software and may include unintentional dictation errors.   Willo Dunnings, MD 06/22/24 [phone]

## 2024-06-22 NOTE — Assessment & Plan Note (Signed)
 PMP reviewed, currently no active prescription

## 2024-06-22 NOTE — Assessment & Plan Note (Signed)
 -  This complicates overall care and prognosis.

## 2024-06-22 NOTE — Assessment & Plan Note (Addendum)
 Blood cultures x 2 are in process, urine culture in process Continue ceftriaxone  2 g IV daily, azithromycin  500 mg IV daily to complete a 5-day course Check MRSA PCR, if positive will reinitiate vancomycin  per pharmacy Maintain MAP greater than 65 No sepsis bolus as patient is in fluid overload with bilateral pleural effusion, left greater than right, requiring BiPAP therapy Strict I's and O's

## 2024-06-22 NOTE — Assessment & Plan Note (Signed)
 -  As needed nicotine patch ordered ?

## 2024-06-22 NOTE — Code Documentation (Signed)
 CODE SEPSIS - PHARMACY COMMUNICATION  **Broad Spectrum Antibiotics should be administered within 1 hour of Sepsis diagnosis**  Time Code Sepsis Called/Page Received: 1657  Antibiotics Ordered: azithromycin , cefepime , vancomycin   Time of 1st antibiotic administration: 1703  Additional action taken by pharmacy: none required  If necessary, Name of Provider/Nurse Contacted: N/A    Adriana JONETTA Bolster ,PharmD Clinical Pharmacist  06/22/2024  4:58 PM

## 2024-06-22 NOTE — Assessment & Plan Note (Signed)
 Home Cardizem  240 mg every evening, carvedilol  6.25 mg p.o. twice daily with meals resumed Hydralazine  5 mg IV every 6 hours as needed for SBP greater 175, 5 days ordered

## 2024-06-22 NOTE — Assessment & Plan Note (Signed)
 With positive delta, NSTEMI, ACS cannot be excluded at this time, however I suspect this is secondary to demand ischemia in setting of acute hypoxic respiratory failure and bilateral pleural effusion, left greater than right Patient denies chest pain, EKG negative for ischemic changes Continue BiPAP therapy Heparin  per pharmacy initiated Patient had complete echo on 04/19/2024, with estimated ejection fraction of 55 to 60%

## 2024-06-22 NOTE — H&P (Signed)
 History and Physical   Eugene [name] FMW:[id number] DOB: 10-28-1960 DOA: 06/22/2024  PCP: [facility], [facility]  Patient coming from: Nursing facility via EMS  I have personally briefly reviewed patient's old medical records in [facility] EMR.  Chief Concern: Shortness of breath, altered mental status  HPI: [name] is a 63 year old male with history of morbid obesity, end-stage renal disease on hemodialysis, insulin -dependent diabetes mellitus 2, hypothyroid, hypertension, who presents ED from nursing facility for chief concerns of shortness of breath, altered mental status.  Vitals in the ED showed tof 98.8, rr 17, hr 83, blood pressure 169/80, SpO2 93% on BiPAP.  Serum sodium is 133, potassium 5.4, chloride 99, bicarb 20, BUN of 63, serum creatinine of 6.07, eGFR of 10, nonfasting blood glucose 335, WBC 15.1, hemoglobin 9.7, as of 214.  Lactic acid was 1.3 and on repeat was 1.1.  COVID/influenza A/influenza B/RSV PCR were negative.  ED treatment: Azithromycin  500 mg IV one-time dose, ceftriaxone  2 g IV one-time dose, vancomycin  per pharmacy.  BiPAP therapy was initiated. --------------------------------------- At bedside, patient was able to tell me his first and last name, age, location, current calendar year.  He reports he started developing shortness of breath today.  He reports no coughing, fever, nausea, vomiting, abdominal pain.  Social history: Patient is currently at nursing facility.  ROS: Unable to fully assess due to acuity of patient presentation and BiPAP mask in place  ED Course: Discussed with EDP, patient requiring hospitalization for chief concerns of hypoxic respiratory failure requiring BiPAP therapy.  Assessment/Plan  Principal Problem:   Severe sepsis with acute organ dysfunction (HCC) Active Problems:   Elevated troponin   Type 2 diabetes mellitus with hyperglycemia, with long-term current use of insulin  (HCC)   HTN (hypertension)    Dyslipidemia   Chronic pain syndrome   GERD (gastroesophageal reflux disease)   Tobacco use disorder   Diabetic gastroparesis (HCC)   Hypothyroidism   Anasarca associated with disorder of kidney   Obesity, Class III, BMI 40-49.9 (morbid obesity)   Assessment and Plan:  * Severe sepsis with acute organ dysfunction (HCC) Blood cultures x 2 are in process, urine culture in process Continue ceftriaxone  2 g IV daily, azithromycin  500 mg IV daily to complete a 5-day course Check MRSA PCR, if positive will reinitiate vancomycin  per pharmacy Maintain MAP greater than 65 No sepsis bolus as patient is in fluid overload with bilateral pleural effusion, left greater than right, requiring BiPAP therapy Strict I's and O's  Elevated troponin With positive delta, NSTEMI, ACS cannot be excluded at this time, however I suspect this is secondary to demand ischemia in setting of acute hypoxic respiratory failure and bilateral pleural effusion, left greater than right Patient denies chest pain, EKG negative for ischemic changes Continue BiPAP therapy Heparin  per pharmacy initiated Patient had complete echo on 04/19/2024, with estimated ejection fraction of 55 to 60%  Obesity, Class III, BMI 40-49.9 (morbid obesity) This complicates overall care and prognosis.   Hypothyroidism Levothyroxine  175 mcg daily resumed  Tobacco use disorder As needed nicotine  patch ordered  Chronic pain syndrome PMP reviewed, currently no active prescription  HTN (hypertension) Home Cardizem  240 mg every evening, carvedilol  6.25 mg p.o. twice daily with meals resumed Hydralazine  5 mg IV every 6 hours as needed for SBP greater 175, 5 days ordered  Type 2 diabetes mellitus with hyperglycemia, with long-term current use of insulin  (HCC) Last A1c on 04/14/2024: Was 6.0. Home long-acting insulin , 10 units twice daily resumed Insulin   SSI with at bedtime coverage, renal dosing ordered on admission  Chart reviewed.   DVT  prophylaxis: Heparin  5000 units subcutaneous every 8 hours Code Status: DNR/DNI, confirmed with patient and MOST form Diet: N.p.o. as patient is requiring BiPAP therapy Family Communication: No Disposition Plan: Pending clinical course, guarded prognosis Consults called: Respiratory, pharmacy Admission status: Stepdown, inpatient  Past Medical History:  Diagnosis Date   Anxiety    Depression    Diabetes mellitus without complication (HCC)    Hepatitis C    Hyperlipidemia    IBS (irritable bowel syndrome)    Osteomyelitis (HCC)    Spinal stenosis    Past Surgical History:  Procedure Laterality Date   BACK SURGERY     bilateral amputation Bilateral    CENTRAL LINE INSERTION-TUNNELED N/A 02/02/2019   Procedure: CENTRAL LINE INSERTION-TUNNELED;  Surgeon: Marea Selinda RAMAN, MD;  Location: [facility] INVASIVE CV [facility];  Service: Cardiovascular;  Laterality: N/A;   COLONOSCOPY WITH PROPOFOL  N/A 01/12/2020   Procedure: COLONOSCOPY WITH PROPOFOL ;  Surgeon: Jinny Carmine, MD;  Location: [facility] ENDOSCOPY;  Service: Endoscopy;  Laterality: N/A;   DIALYSIS/PERMA CATHETER INSERTION N/A 05/22/2024   Procedure: DIALYSIS/PERMA CATHETER INSERTION;  Surgeon: Jama Cordella MATSU, MD;  Location: [facility] INVASIVE CV [facility];  Service: Cardiovascular;  Laterality: N/A;   ESOPHAGOGASTRODUODENOSCOPY (EGD) WITH PROPOFOL  N/A 12/07/2019   Procedure: ESOPHAGOGASTRODUODENOSCOPY (EGD) WITH PROPOFOL ;  Surgeon: Unk Corinn Skiff, MD;  Location: [facility] ENDOSCOPY;  Service: Gastroenterology;  Laterality: N/A;   IR CATHETER TUBE CHANGE  12/04/2019   SPINAL FUSION     THORACIC SPINE SURGERY  01/2019   extensive washout   Social History:  reports that he has been smoking cigarettes. He has never used smokeless tobacco. He reports that he does not currently use alcohol. He reports that he does not currently use drugs.  No Known Allergies Family History  Problem Relation Age of Onset   Kidney failure Father    Kidney failure Brother     Family history: Family history reviewed and not pertinent.  Prior to Admission medications   Medication Sig Start Date End Date Taking? Authorizing Provider  acetaminophen  (TYLENOL ) 325 MG tablet Take 650 mg by mouth in the morning and at bedtime.   Yes [provider]  bisacodyl  (DULCOLAX) 10 MG suppository Place 10 mg rectally daily as needed for moderate constipation.   Yes [provider]  bisacodyl  (DULCOLAX) 5 MG EC tablet Take 2 tablets (10 mg total) by mouth at bedtime. 05/16/24  Yes Fausto Burnard LABOR, DO  carvedilol  (COREG ) 6.25 MG tablet Take 1 tablet (6.25 mg total) by mouth 2 (two) times daily with a meal. Patient taking differently: Take 6.25 mg by mouth 2 (two) times daily with a meal. Every Tuesday, Thursday, Saturday and sunday 05/28/24  Yes Caleen Qualia, MD  cyanocobalamin  1000 MCG tablet Take 1 tablet (1,000 mcg total) by mouth daily. 05/17/24  Yes Fausto Burnard A, DO  diltiazem  (CARDIZEM  CD) 240 MG 24 hr capsule Take 1 capsule (240 mg total) by mouth every evening. 05/28/24  Yes Caleen Qualia, MD  Doxepin  HCl 6 MG TABS Take 6 mg by mouth at bedtime.   Yes [provider]  famotidine  (PEPCID ) 10 MG tablet Take 1 tablet (10 mg total) by mouth daily. 05/17/24  Yes Fausto Burnard A, DO  folic acid  (FOLVITE ) 1 MG tablet Take 1 tablet (1 mg total) by mouth daily. 05/17/24  Yes Fausto Burnard A, DO  furosemide  (LASIX ) 80 MG tablet Take 1 tablet (80  mg total) by mouth daily. Patient taking differently: Take 80 mg by mouth at bedtime. 05/28/24  Yes Caleen Qualia, MD  gabapentin  (NEURONTIN ) 300 MG capsule Take 300 mg by mouth at bedtime.   Yes [provider]  guaifenesin (ROBITUSSIN) 100 MG/5ML syrup Take 200 mg by mouth 3 (three) times daily as needed for cough.   Yes [provider]  insulin  glargine-yfgn (SEMGLEE ) 100 UNIT/ML injection Inject 0.1 mLs (10 Units total) into the skin 2 (two) times daily. Patient taking differently: Inject 12-14  Units into the skin 2 (two) times daily. 05/01/24  Yes Sreenath, Sudheer B, MD  insulin  lispro (HUMALOG ) 100 UNIT/ML injection Inject 0-15 Units into the skin 4 (four) times daily -  before meals and at bedtime. <150= 0 units 151-200= 3 units 201-250= 6 units 251-300= 9 units 301-350= 12 units 351-400= 15 units >400 call MD Patient taking differently: Inject 0-15 Units into the skin 4 (four) times daily -  before meals and at bedtime. <150= 0 units 151-200= 3 units 201-250= 6 units 251-300= 9 units 301-350= 12 units 351-400= 15 units >400 call MD   Yes [provider]  ipratropium-albuterol  (DUONEB) 0.5-2.5 (3) MG/3ML SOLN Take 3 mLs by nebulization every 6 (six) hours as needed.   Yes [provider]  Lactulose  20 GM/30ML SOLN Take 30 mLs by mouth 2 (two) times daily.   Yes [provider]  levothyroxine  (SYNTHROID ) 175 MCG tablet Take 175 mcg by mouth at bedtime.   Yes [provider]  linaclotide  (LINZESS ) 145 MCG CAPS capsule Take 1 capsule (145 mcg total) by mouth daily before breakfast. 10/25/19  Yes Danford, Lonni SQUIBB, MD  Melatonin 5 MG TABS Take 10 mg by mouth at bedtime.   Yes [provider]  methylPREDNISolone  sodium succinate (SOLU-MEDROL ) 125 mg/2 mL injection Inject 125 mg into the muscle once.   Yes [provider]  metoCLOPramide  (REGLAN ) 10 MG tablet Take 0.5 tablets (5 mg total) by mouth every 8 (eight) hours as needed for nausea or vomiting. 05/28/24  Yes Caleen Qualia, MD  midodrine  (PROAMATINE ) 5 MG tablet Take 5 mg by mouth 3 (three) times daily with meals as needed.   Yes [provider]  ondansetron  (ZOFRAN ) 4 MG tablet Take 4 mg by mouth every 8 (eight) hours as needed for nausea or vomiting.   Yes [provider]  Oxycodone  HCl 10 MG TABS Take 0.5-1 tablets (5-10 mg total) by mouth every 6 (six) hours as needed. 05/28/24  Yes Amin, Sumayya, MD  polyethylene glycol (MIRALAX  / GLYCOLAX ) 17 g packet  Take 17 g by mouth 2 (two) times daily. 10/24/19  Yes Danford, Lonni SQUIBB, MD  senna-docusate (SENOKOT-S) 8.6-50 MG tablet Take 1 tablet by mouth 2 (two) times daily. 10/24/19  Yes Danford, Lonni SQUIBB, MD  simethicone  (MYLICON) 80 MG chewable tablet Chew 1 tablet (80 mg total) by mouth 4 (four) times daily. 05/01/24  Yes Sreenath, Sudheer B, MD  sitaGLIPtin (JANUVIA) 25 MG tablet Take 25 mg by mouth daily.   Yes [provider]  Vitamin D , Ergocalciferol , (DRISDOL ) 1.25 MG (50000 UNIT) CAPS capsule Take 1 capsule (50,000 Units total) by mouth every 7 (seven) days. 05/18/24  Yes Fausto Sor A, DO  hydrocortisone  cream 1 % Apply 1 Application topically daily. Patient not taking: Reported on 06/22/2024    [provider]  pregabalin  (LYRICA ) 75 MG capsule Take 75 mg by mouth 2 (two) times daily. Patient not taking: Reported on 04/17/2021  04/20/21  [provider]   Physical Exam: Vitals:   06/22/24 1556 06/22/24 1600 06/22/24 1605 06/22/24 1700  BP: (!) 166/82 (!) 169/80  (!) 159/79  Pulse: 84 83  84  Resp: 17 17  18   Temp:      TempSrc:      SpO2: 92% 93% 93% 92%  Weight:      Height:       Constitutional: appears older than chronological age, chronically ill, mildly uncomfortable Eyes: PERRL, lids and conjunctivae normal ENMT: Mucous membranes are dry.  Unable to assess posterior pharynx as patient has BiPAP therapy in place.  Able to assess dentition. Hearing appropriate Neck: normal, supple, no masses, no thyromegaly Respiratory: clear to auscultation bilaterally, no wheezing, no crackles. Normal respiratory effort. No accessory muscle use.  Cardiovascular: Regular rate and rhythm, no murmurs / rubs / gallops.  Generalized extremity edema. 2+ pedal pulses. No carotid bruits.  Abdomen: Morbidly obese abdomen, no tenderness, no masses palpated, no hepatosplenomegaly. Bowel sounds positive.  Musculoskeletal: no clubbing / cyanosis. No joint deformity upper and  lower extremities. Good ROM, no contractures, no atrophy. Normal muscle tone.  Skin: no rashes, lesions, ulcers. No induration Neurologic: Sensation intact. Strength 5/5 in all 4.  Psychiatric: Normal judgment and insight. Alert and oriented x 3. Normal mood.   EKG: independently reviewed, showing sinus rhythm with rate 86, QTc 476  Chest x-ray on Admission: I personally reviewed and I agree with radiologist reading as below.  CT Head Wo Contrast Result Date: 06/22/2024 CLINICAL DATA:  Altered mental status.  Hallucination. EXAM: CT HEAD WITHOUT CONTRAST TECHNIQUE: Contiguous axial images were obtained from the base of the skull through the vertex without intravenous contrast. RADIATION DOSE REDUCTION: This exam was performed according to the departmental dose-optimization program which includes automated exposure control, adjustment of the mA and/or kV according to patient size and/or use of iterative reconstruction technique. COMPARISON:  05/11/2024 FINDINGS: Brain: Ventricles, cisterns and other CSF spaces are normal. No mass, mass effect, shift of midline structures or acute hemorrhage. No evidence of acute infarction. Vascular: No hyperdense vessel or unexpected calcification. Skull: Normal. Negative for fracture or focal lesion. Sinuses/Orbits: Orbits are normal. Paranasal sinuses are well aerated and otherwise clear. Other: None. IMPRESSION: No acute findings. Electronically Signed   By: Toribio Agreste M.D.   On: 06/22/2024 17:32   DG Chest Portable 1 View Result Date: 06/22/2024 CLINICAL DATA:  Shortness of breath EXAM: PORTABLE CHEST 1 VIEW COMPARISON:  05/27/2024, CT 05/11/2024, 04/19/2024, 10/22/2019 FINDINGS: Limited by patient positioning. Right-sided central venous. Similar small left greater than right pleural effusions with probable loculation on the left. Persistent bibasilar airspace disease. No pneumothorax IMPRESSION: Similar small left greater than right pleural effusions with  probable loculation on the left. Persistent bibasilar airspace disease. Cardiomegaly Electronically Signed   By: Luke Bun M.D.   On: 06/22/2024 16:24   Labs on Admission: I have personally reviewed following labs  CBC: Recent Labs  Lab 06/22/24 1553  WBC 15.1*  NEUTROABS 11.4*  HGB 9.7*  HCT 31.7*  MCV 96.4  PLT 214   Basic Metabolic Panel: Recent Labs  Lab 06/22/24 1553  NA 133*  K 5.4*  CL 99  CO2 20*  GLUCOSE 335*  BUN 63*  CREATININE 6.07*  CALCIUM  7.7*  MG 2.5*  PHOS 7.1*   GFR: Estimated Creatinine Clearance: 17.8 mL/min (A) (by C-G formula based on SCr of 6.07 mg/dL (H)).  Liver Function Tests: Recent Labs  Lab  06/22/24 1553  AST 114*  ALT 102*  ALKPHOS 70  BILITOT 0.6  PROT 7.0  ALBUMIN  2.4*   Urine analysis:    Component Value Date/Time   COLORURINE YELLOW (A) 06/22/2024 1600   APPEARANCEUR TURBID (A) 06/22/2024 1600   LABSPEC 1.027 06/22/2024 1600   PHURINE 6.0 06/22/2024 1600   GLUCOSEU 50 (A) 06/22/2024 1600   HGBUR NEGATIVE 06/22/2024 1600   BILIRUBINUR NEGATIVE 06/22/2024 1600   KETONESUR NEGATIVE 06/22/2024 1600   PROTEINUR 100 (A) 06/22/2024 1600   NITRITE NEGATIVE 06/22/2024 1600   LEUKOCYTESUR MODERATE (A) 06/22/2024 1600   CRITICAL CARE Performed by: Dr. Sherre  Total critical care time: 32 minutes  Critical care time was exclusive of separately billable procedures and treating other patients.  Critical care was necessary to treat or prevent imminent or life-threatening deterioration.  Critical care was time spent personally by me on the following activities: development of treatment plan with patient as well as nursing, discussions with consultants, evaluation of patient's response to treatment, examination of patient, obtaining history from patient or surrogate, ordering and performing treatments and interventions, ordering and review of laboratory studies, ordering and review of radiographic studies, pulse oximetry and  re-evaluation of patient's condition.  This document was prepared using Dragon Voice Recognition software and may include unintentional dictation errors.  Dr. Sherre [facility]  If 7PM-7AM, please contact overnight-coverage provider If 7AM-7PM, please contact day attending provider [email or web address]  06/22/2024, 7:09 PM

## 2024-06-22 NOTE — ED Notes (Signed)
Dialysis RN at bedside for dialysis.

## 2024-06-22 NOTE — Assessment & Plan Note (Signed)
 Last A1c on 04/14/2024: Was 6.0. Home long-acting insulin , 10 units twice daily resumed Insulin  SSI with at bedtime coverage, renal dosing ordered on admission

## 2024-06-23 ENCOUNTER — Encounter: Payer: Self-pay | Admitting: Internal Medicine

## 2024-06-23 ENCOUNTER — Other Ambulatory Visit: Payer: Self-pay

## 2024-06-23 ENCOUNTER — Inpatient Hospital Stay

## 2024-06-23 DIAGNOSIS — A419 Sepsis, unspecified organism: Secondary | ICD-10-CM | POA: Diagnosis not present

## 2024-06-23 DIAGNOSIS — R6520 Severe sepsis without septic shock: Secondary | ICD-10-CM | POA: Diagnosis not present

## 2024-06-23 LAB — BASIC METABOLIC PANEL WITH GFR
Anion gap: 11 (ref 5–15)
BUN: 51 mg/dL — ABNORMAL HIGH (ref 8–23)
CO2: 23 mmol/L (ref 22–32)
Calcium: 7.5 mg/dL — ABNORMAL LOW (ref 8.9–10.3)
Chloride: 97 mmol/L — ABNORMAL LOW (ref 98–111)
Creatinine, Ser: 4.65 mg/dL — ABNORMAL HIGH (ref 0.61–1.24)
GFR, Estimated: 14 mL/min — ABNORMAL LOW (ref >60)
Glucose, Bld: 373 mg/dL — ABNORMAL HIGH (ref 70–99)
Potassium: 4.7 mmol/L (ref 3.5–5.1)
Sodium: 131 mmol/L — ABNORMAL LOW (ref 135–145)

## 2024-06-23 LAB — CBC
HCT: 29.9 % — ABNORMAL LOW (ref 39.0–52.0)
Hemoglobin: 9.1 g/dL — ABNORMAL LOW (ref 13.0–17.0)
MCH: 29.1 pg (ref 26.0–34.0)
MCHC: 30.4 g/dL (ref 30.0–36.0)
MCV: 95.5 fL (ref 80.0–100.0)
Platelets: 210 K/uL (ref 150–400)
RBC: 3.13 MIL/uL — ABNORMAL LOW (ref 4.22–5.81)
RDW: 14.3 % (ref 11.5–15.5)
WBC: 19.2 K/uL — ABNORMAL HIGH (ref 4.0–10.5)
nRBC: 0 % (ref 0.0–0.2)

## 2024-06-23 LAB — GLUCOSE, CAPILLARY
Glucose-Capillary: 346 mg/dL — ABNORMAL HIGH (ref 70–99)
Glucose-Capillary: 363 mg/dL — ABNORMAL HIGH (ref 70–99)
Glucose-Capillary: 358 mg/dL — ABNORMAL HIGH (ref 70–99)
Glucose-Capillary: 359 mg/dL — ABNORMAL HIGH (ref 70–99)

## 2024-06-23 LAB — HEPARIN LEVEL (UNFRACTIONATED)
Heparin Unfractionated: 0.15 [IU]/mL — ABNORMAL LOW (ref 0.30–0.70)
Heparin Unfractionated: 0.22 [IU]/mL — ABNORMAL LOW (ref 0.30–0.70)
Heparin Unfractionated: 0.36 [IU]/mL (ref 0.30–0.70)

## 2024-06-23 LAB — CBG MONITORING, ED
Glucose-Capillary: 282 mg/dL — ABNORMAL HIGH (ref 70–99)
Glucose-Capillary: 315 mg/dL — ABNORMAL HIGH (ref 70–99)
Glucose-Capillary: 321 mg/dL — ABNORMAL HIGH (ref 70–99)

## 2024-06-23 LAB — TROPONIN I (HIGH SENSITIVITY): Troponin I (High Sensitivity): 260 ng/L (ref <18)

## 2024-06-23 MED ORDER — HEPARIN BOLUS VIA INFUSION
3100.0000 [IU] | Freq: Once | INTRAVENOUS | Status: AC
  Administered 2024-06-23: 3100 [IU] via INTRAVENOUS
  Filled 2024-06-23: qty 3100

## 2024-06-23 MED ORDER — INSULIN ASPART 100 UNIT/ML IJ SOLN: 0.0000 [IU] | Freq: Three times a day (TID) | INTRAMUSCULAR | Status: DC

## 2024-06-23 MED ORDER — INSULIN ASPART 100 UNIT/ML IJ SOLN
6.0000 [IU] | Freq: Once | INTRAMUSCULAR | Status: AC
  Administered 2024-06-23: 6 [IU] via SUBCUTANEOUS
  Filled 2024-06-23: qty 1

## 2024-06-23 MED ORDER — INSULIN GLARGINE 100 UNIT/ML ~~LOC~~ SOLN
12.0000 [IU] | Freq: Two times a day (BID) | SUBCUTANEOUS | Status: DC
  Administered 2024-06-23: 12 [IU] via SUBCUTANEOUS
  Filled 2024-06-23 (×2): qty 0.12

## 2024-06-23 MED ORDER — HEPARIN BOLUS VIA INFUSION
1550.0000 [IU] | Freq: Once | INTRAVENOUS | Status: AC
  Administered 2024-06-23: 1550 [IU] via INTRAVENOUS
  Filled 2024-06-23: qty 1550

## 2024-06-23 MED ORDER — OXYCODONE HCL 5 MG PO TABS
5.0000 mg | ORAL_TABLET | Freq: Four times a day (QID) | ORAL | Status: DC | PRN
  Administered 2024-06-23 – 2024-06-24 (×2): 5 mg via ORAL
  Filled 2024-06-23 (×2): qty 1

## 2024-06-23 MED ORDER — INSULIN ASPART 100 UNIT/ML IJ SOLN
0.0000 [IU] | Freq: Three times a day (TID) | INTRAMUSCULAR | Status: DC
  Administered 2024-06-23: 15 [IU] via SUBCUTANEOUS
  Administered 2024-06-24: 4 [IU] via SUBCUTANEOUS
  Administered 2024-06-24: 7 [IU] via SUBCUTANEOUS
  Administered 2024-06-24: 3 [IU] via SUBCUTANEOUS
  Administered 2024-06-24 – 2024-06-25 (×2): 15 [IU] via SUBCUTANEOUS
  Administered 2024-06-25 (×2): 11 [IU] via SUBCUTANEOUS
  Filled 2024-06-23 (×8): qty 1

## 2024-06-23 MED ORDER — INSULIN ASPART 100 UNIT/ML IJ SOLN
0.0000 [IU] | Freq: Three times a day (TID) | INTRAMUSCULAR | Status: DC
  Administered 2024-06-23: 15 [IU] via SUBCUTANEOUS
  Filled 2024-06-23: qty 1

## 2024-06-23 NOTE — ED Notes (Addendum)
 Pt removed his bipap mask and is refusing to have it put back on. When this RN attempted to replace mask pt attempted to grab Rns arm. Pt stated I just want that thing back in my nose with the two prongs. Pt placed on 5L [place], O2 87-90% on 5L [place]. [place], MD notified.

## 2024-06-23 NOTE — Progress Notes (Addendum)
 PHARMACY - ANTICOAGULATION CONSULT NOTE  Pharmacy Consult for heparin  drip  Indication: chest pain/ACS/NSTEMI  No Known Allergies  Patient Measurements: Height: 5' 10 (177.8 cm) Weight: 136.1 kg (300 lb) IBW/kg (Calculated) : 73 HEPARIN  DW (KG): 104.7  Vital Signs: BP: 123/71 (09/30 1947) Pulse Rate: 71 (09/30 1947)  Labs: Recent Labs    06/22/24 1553 06/22/24 1823 06/22/24 1944 06/23/24 0347 06/23/24 1123 06/23/24 2007  HGB 9.7*  --   --  9.1*  --   --   HCT 31.7*  --   --  29.9*  --   --   PLT 214  --   --  210  --   --   APTT  --   --  114*  --   --   --   LABPROT  --   --  14.5  --   --   --   INR  --   --  1.1  --   --   --   HEPARINUNFRC  --   --   --  0.15* 0.22* 0.36  CREATININE 6.07*  --   --  4.65*  --   --   TROPONINIHS 87* 196*  --  260*  --   --     Estimated Creatinine Clearance: 23.2 mL/min (A) (by C-G formula based on SCr of 4.65 mg/dL (H)).   Medical History: Past Medical History:  Diagnosis Date   Anxiety    Depression    Diabetes mellitus without complication (HCC)    Hepatitis C    Hyperlipidemia    IBS (irritable bowel syndrome)    Osteomyelitis (HCC)    Spinal stenosis     Medications:  Medications Prior to Admission  Medication Sig Dispense Refill Last Dose/Taking   acetaminophen  (TYLENOL ) 325 MG tablet Take 650 mg by mouth in the morning and at bedtime.   06/22/2024   bisacodyl  (DULCOLAX) 10 MG suppository Place 10 mg rectally daily as needed for moderate constipation.   Taking As Needed   bisacodyl  (DULCOLAX) 5 MG EC tablet Take 2 tablets (10 mg total) by mouth at bedtime.   06/21/2024   carvedilol  (COREG ) 6.25 MG tablet Take 1 tablet (6.25 mg total) by mouth 2 (two) times daily with a meal. (Patient taking differently: Take 6.25 mg by mouth 2 (two) times daily with a meal. Every Tuesday, Thursday, Saturday and sunday)   06/21/2024   cyanocobalamin  1000 MCG tablet Take 1 tablet (1,000 mcg total) by mouth daily.   06/22/2024    diltiazem  (CARDIZEM  CD) 240 MG 24 hr capsule Take 1 capsule (240 mg total) by mouth every evening.   06/21/2024   Doxepin  HCl 6 MG TABS Take 6 mg by mouth at bedtime.   06/21/2024   famotidine  (PEPCID ) 10 MG tablet Take 1 tablet (10 mg total) by mouth daily.   06/22/2024   folic acid  (FOLVITE ) 1 MG tablet Take 1 tablet (1 mg total) by mouth daily.   06/22/2024   furosemide  (LASIX ) 80 MG tablet Take 1 tablet (80 mg total) by mouth daily. (Patient taking differently: Take 80 mg by mouth at bedtime.)   06/21/2024   gabapentin  (NEURONTIN ) 300 MG capsule Take 300 mg by mouth at bedtime.   06/21/2024   guaifenesin (ROBITUSSIN) 100 MG/5ML syrup Take 200 mg by mouth 3 (three) times daily as needed for cough.   Taking As Needed   insulin  glargine-yfgn (SEMGLEE ) 100 UNIT/ML injection Inject 0.1 mLs (10 Units total) into the skin  2 (two) times daily. (Patient taking differently: Inject 12-14 Units into the skin 2 (two) times daily.)   06/22/2024 Morning   insulin  lispro (HUMALOG ) 100 UNIT/ML injection Inject 0-15 Units into the skin 4 (four) times daily -  before meals and at bedtime. <150= 0 units 151-200= 3 units 201-250= 6 units 251-300= 9 units 301-350= 12 units 351-400= 15 units >400 call MD (Patient taking differently: Inject 0-15 Units into the skin 4 (four) times daily -  before meals and at bedtime. <150= 0 units 151-200= 3 units 201-250= 6 units 251-300= 9 units 301-350= 12 units 351-400= 15 units >400 call MD)   06/22/2024   ipratropium-albuterol  (DUONEB) 0.5-2.5 (3) MG/3ML SOLN Take 3 mLs by nebulization every 6 (six) hours as needed.   Taking As Needed   Lactulose  20 GM/30ML SOLN Take 30 mLs by mouth 2 (two) times daily.   06/22/2024   levothyroxine  (SYNTHROID ) 175 MCG tablet Take 175 mcg by mouth at bedtime.   06/21/2024   linaclotide  (LINZESS ) 145 MCG CAPS capsule Take 1 capsule (145 mcg total) by mouth daily before breakfast. 30 capsule 0 06/22/2024   Melatonin 5 MG TABS Take 10 mg by mouth at  bedtime.   06/21/2024   methylPREDNISolone  sodium succinate (SOLU-MEDROL ) 125 mg/2 mL injection Inject 125 mg into the muscle once.   Taking   metoCLOPramide  (REGLAN ) 10 MG tablet Take 0.5 tablets (5 mg total) by mouth every 8 (eight) hours as needed for nausea or vomiting.   06/22/2024   midodrine  (PROAMATINE ) 5 MG tablet Take 5 mg by mouth 3 (three) times daily with meals as needed.   Taking As Needed   ondansetron  (ZOFRAN ) 4 MG tablet Take 4 mg by mouth every 8 (eight) hours as needed for nausea or vomiting.   Taking As Needed   Oxycodone  HCl 10 MG TABS Take 0.5-1 tablets (5-10 mg total) by mouth every 6 (six) hours as needed. 30 tablet 0 06/22/2024   polyethylene glycol (MIRALAX  / GLYCOLAX ) 17 g packet Take 17 g by mouth 2 (two) times daily. 14 each 0 06/22/2024 Morning   senna-docusate (SENOKOT-S) 8.6-50 MG tablet Take 1 tablet by mouth 2 (two) times daily.   06/22/2024 Morning   simethicone  (MYLICON) 80 MG chewable tablet Chew 1 tablet (80 mg total) by mouth 4 (four) times daily.   06/22/2024   sitaGLIPtin (JANUVIA) 25 MG tablet Take 25 mg by mouth daily.   06/22/2024   Vitamin D , Ergocalciferol , (DRISDOL ) 1.25 MG (50000 UNIT) CAPS capsule Take 1 capsule (50,000 Units total) by mouth every 7 (seven) days.   06/19/2024   hydrocortisone  cream 1 % Apply 1 Application topically daily. (Patient not taking: Reported on 06/22/2024)   Not Taking   Scheduled:   carvedilol   6.25 mg Oral BID WC   Chlorhexidine  Gluconate Cloth  6 each Topical Q0600   diltiazem   240 mg Oral QPM   insulin  aspart  0-15 Units Subcutaneous TID WC   insulin  aspart  0-5 Units Subcutaneous QHS   insulin  glargine  12 Units Subcutaneous BID   levothyroxine   175 mcg Oral Q0600   melatonin  10 mg Oral QHS   Infusions:   azithromycin  500 mg (06/23/24 1213)   cefTRIAXone  (ROCEPHIN )  IV Stopped (06/23/24 1245)   heparin  1,900 Units/hr (06/23/24 1312)    Assessment: 63 yo M to start heparin  drip for NSTEMI.  Admitted w/ SHOB, AMS,  sepsis.   Yk:fnmapi obesity, diabetes, CHF, chronic hypoxic respiratory failure on 5 to 6  L, ESRD on HD, SVT, neurogenic bladder s/p suprapubic catheter, and chronic pain syndrome  No anticoagulation PTA per med rec and Rx fill hx  Baseline:  Hgb 9.7  Plt 214   INR 1.1  aPTT 114 (drawn after heparin  drip started)    Goal of Therapy:  Heparin  level 0.3-0.7 units/ml Monitor platelets by anticoagulation protocol: Yes  0930 0347 HL 0.15 0930 1123 HL 0.22 1001 2007 HL 0.36 Therapeutic x 1 (1900 un/hr)   Plan: 1001 2007 HL 0.36 Therapeutic x 1 - Continue heparin  infusion to 1900 units/hr - Recheck confirmatory HL eight hours after last draw - CBC daily while on heparin   Will M. Lenon, PharmD, BCPS Clinical Pharmacist 06/23/2024 8:47 PM

## 2024-06-23 NOTE — ED Notes (Signed)
 RT at bedside.

## 2024-06-23 NOTE — Progress Notes (Signed)
 Pt receives outpt HD at  [facility] on MWF 11:45am chair time. Navigator following to assist with any HD needs.  Suzen Satchel Dialysis Navigator [phone]

## 2024-06-23 NOTE — Progress Notes (Signed)
 Progress Note   Patient: Eugene [name] FMW:[id number] DOB: 28-Jun-1961 DOA: 06/22/2024     1 DOS: the patient was seen and examined on 06/23/2024   Brief hospital course: [name] is a 63 year old male with history of morbid obesity, end-stage renal disease on hemodialysis, insulin -dependent diabetes mellitus 2, hypothyroid, hypertension, who presents ED from nursing facility for chief concerns of shortness of breath, altered mental status. COVID/influenza A/influenza B/RSV PCR were negative.  Assessment and Plan: Severe sepsis with acute organ dysfunction (HCC) Secondary to pneumonia Acute hypoxic respiratory failure Presented with WBC 19.2, tachycardia CT scan of the chest showing findings consolidation in bilateral lower lobes Patient with small partially loculated effusion on the left IR thoracentesis requested, so we will keep n.p.o. after midnight Follow-up on blood culture results Continue ceftriaxone  2 g IV daily, azithromycin  500 mg IV daily to complete a 5-day course Check MRSA PCR Patient required BiPAP on arrival but have been weaned off to high flow   Elevated troponin likely secondary to demand ischemia in the setting of sepsis and hypoxic respiratory failure Patient noted to have uptrending troponin Denies ongoing chest pain Complete 48 hours of heparin  drip as recommended by cardiology Plan of care discussed with cardiologist Patient had complete echo on 04/19/2024, with estimated ejection fraction of 55 to 60%   Obesity, Class III, BMI 40-49.9 (morbid obesity) This complicates overall care and prognosis.    Hypothyroidism Levothyroxine  175 mcg daily resumed   Tobacco use disorder As needed nicotine  patch ordered   Chronic pain syndrome PMP reviewed, currently no active prescription   HTN (hypertension) Home Cardizem  240 mg every evening, carvedilol  6.25 mg p.o. twice daily with meals resumed Hydralazine  5 mg IV every 6 hours as needed for SBP  greater 175, 5 days ordered   Type 2 diabetes mellitus with hyperglycemia, with long-term current use of insulin  (HCC) Last A1c on 04/14/2024: Was 6.0. Home long-acting insulin , 10 units twice daily resumed Insulin  SSI with at bedtime coverage, renal dosing ordered on admission   ESRD on hemodialysis Nephrology is consulted for dialysis needs  DVT prophylaxis: Heparin  5000 units subcutaneous every 8 hours Code Status: DNR/DNI, confirmed with patient and MOST form   Subjective:  Patient seen and examined at bedside this morning Currently on high flow nasal oxygen Maintaining appropriate saturation with no respiratory distress Denies chest pain nausea vomiting abdominal pain  Physical Exam: Respiratory: clear to auscultation bilaterally, no wheezing, no crackles. Normal respiratory effort. No accessory muscle use.  Cardiovascular: Regular rate and rhythm, no murmurs / rubs / gallops.  Generalized extremity edema. 2+ pedal pulses. No carotid bruits.  Abdomen: Morbidly obese abdomen, no tenderness, no masses palpated, no hepatosplenomegaly. Bowel sounds positive.  Musculoskeletal: no clubbing / cyanosis. No joint deformity upper and lower extremities. Good ROM, no contractures, no atrophy. Normal muscle tone.  Skin: no rashes, lesions, ulcers. No induration Neurologic: Sensation intact. Strength 5/5 in all 4.  Psychiatric: Normal judgment and insight.     Vitals:   06/23/24 0820 06/23/24 1000 06/23/24 1210 06/23/24 1405  BP: 128/70 122/75 136/70 132/75  Pulse: 80 73 75 82  Resp: 20 18 (!) 22 18  Temp: 98.7 F (37.1 C)     TempSrc: Oral     SpO2: 92% 97% 95% 93%  Weight:      Height:        Data Reviewed:  I have reviewed patient's CT scan as shown above I have also reviewed below mentioned labs  Latest Ref Rng & Units 06/23/2024    3:47 AM 06/22/2024    3:53 PM 05/27/2024    5:04 AM  BMP  Glucose 70 - 99 mg/dL 626  664  834   BUN 8 - 23 mg/dL 51  63  18   Creatinine  0.61 - 1.24 mg/dL 5.34  3.92  7.31   Sodium 135 - 145 mmol/L 131  133  138   Potassium 3.5 - 5.1 mmol/L 4.7  5.4  3.5   Chloride 98 - 111 mmol/L 97  99  99   CO2 22 - 32 mmol/L 23  20  29    Calcium  8.9 - 10.3 mg/dL 7.5  7.7  7.2        Latest Ref Rng & Units 06/23/2024    3:47 AM 06/22/2024    3:53 PM 05/27/2024    5:04 AM  CBC  WBC 4.0 - 10.5 K/uL 19.2  15.1  4.5   Hemoglobin 13.0 - 17.0 g/dL 9.1  9.7  8.5   Hematocrit 39.0 - 52.0 % 29.9  31.7  26.6   Platelets 150 - 400 K/uL 210  214  194     Family Communication: No family present at bedside  Disposition: Pending clinical course  Author: Drue ONEIDA Potter, MD 06/23/2024 4:40 PM  For on call review [email or web address].

## 2024-06-23 NOTE — Progress Notes (Signed)
 PHARMACY - ANTICOAGULATION CONSULT NOTE  Pharmacy Consult for heparin  drip  Indication: chest pain/ACS/NSTEMI  No Known Allergies  Patient Measurements: Height: 5' 10 (177.8 cm) Weight: 136.1 kg (300 lb) IBW/kg (Calculated) : 73 HEPARIN  DW (KG): 104.7  Vital Signs: Temp: 98.6 F (37 C) (09/30 0624) Temp Source: Oral (09/30 0624) BP: 126/63 (09/30 0715) Pulse Rate: 72 (09/30 0700)  Labs: Recent Labs    06/22/24 1553 06/22/24 1823 06/22/24 1944 06/23/24 0347 06/23/24 1123  HGB 9.7*  --   --  9.1*  --   HCT 31.7*  --   --  29.9*  --   PLT 214  --   --  210  --   APTT  --   --  114*  --   --   LABPROT  --   --  14.5  --   --   INR  --   --  1.1  --   --   HEPARINUNFRC  --   --   --  0.15* 0.22*  CREATININE 6.07*  --   --  4.65*  --   TROPONINIHS 87* 196*  --  260*  --     Estimated Creatinine Clearance: 23.2 mL/min (A) (by C-G formula based on SCr of 4.65 mg/dL (H)).   Medical History: Past Medical History:  Diagnosis Date   Anxiety    Depression    Diabetes mellitus without complication (HCC)    Hepatitis C    Hyperlipidemia    IBS (irritable bowel syndrome)    Osteomyelitis (HCC)    Spinal stenosis     Medications:  Medications Prior to Admission  Medication Sig Dispense Refill Last Dose/Taking   acetaminophen  (TYLENOL ) 325 MG tablet Take 650 mg by mouth in the morning and at bedtime.   06/22/2024   bisacodyl  (DULCOLAX) 10 MG suppository Place 10 mg rectally daily as needed for moderate constipation.   Taking As Needed   bisacodyl  (DULCOLAX) 5 MG EC tablet Take 2 tablets (10 mg total) by mouth at bedtime.   06/21/2024   carvedilol  (COREG ) 6.25 MG tablet Take 1 tablet (6.25 mg total) by mouth 2 (two) times daily with a meal. (Patient taking differently: Take 6.25 mg by mouth 2 (two) times daily with a meal. Every Tuesday, Thursday, Saturday and sunday)   06/21/2024   cyanocobalamin  1000 MCG tablet Take 1 tablet (1,000 mcg total) by mouth daily.   06/22/2024    diltiazem  (CARDIZEM  CD) 240 MG 24 hr capsule Take 1 capsule (240 mg total) by mouth every evening.   06/21/2024   Doxepin  HCl 6 MG TABS Take 6 mg by mouth at bedtime.   06/21/2024   famotidine  (PEPCID ) 10 MG tablet Take 1 tablet (10 mg total) by mouth daily.   06/22/2024   folic acid  (FOLVITE ) 1 MG tablet Take 1 tablet (1 mg total) by mouth daily.   06/22/2024   furosemide  (LASIX ) 80 MG tablet Take 1 tablet (80 mg total) by mouth daily. (Patient taking differently: Take 80 mg by mouth at bedtime.)   06/21/2024   gabapentin  (NEURONTIN ) 300 MG capsule Take 300 mg by mouth at bedtime.   06/21/2024   guaifenesin (ROBITUSSIN) 100 MG/5ML syrup Take 200 mg by mouth 3 (three) times daily as needed for cough.   Taking As Needed   insulin  glargine-yfgn (SEMGLEE ) 100 UNIT/ML injection Inject 0.1 mLs (10 Units total) into the skin 2 (two) times daily. (Patient taking differently: Inject 12-14 Units into the skin 2 (two)  times daily.)   06/22/2024 Morning   insulin  lispro (HUMALOG ) 100 UNIT/ML injection Inject 0-15 Units into the skin 4 (four) times daily -  before meals and at bedtime. <150= 0 units 151-200= 3 units 201-250= 6 units 251-300= 9 units 301-350= 12 units 351-400= 15 units >400 call MD (Patient taking differently: Inject 0-15 Units into the skin 4 (four) times daily -  before meals and at bedtime. <150= 0 units 151-200= 3 units 201-250= 6 units 251-300= 9 units 301-350= 12 units 351-400= 15 units >400 call MD)   06/22/2024   ipratropium-albuterol  (DUONEB) 0.5-2.5 (3) MG/3ML SOLN Take 3 mLs by nebulization every 6 (six) hours as needed.   Taking As Needed   Lactulose  20 GM/30ML SOLN Take 30 mLs by mouth 2 (two) times daily.   06/22/2024   levothyroxine  (SYNTHROID ) 175 MCG tablet Take 175 mcg by mouth at bedtime.   06/21/2024   linaclotide  (LINZESS ) 145 MCG CAPS capsule Take 1 capsule (145 mcg total) by mouth daily before breakfast. 30 capsule 0 06/22/2024   Melatonin 5 MG TABS Take 10 mg by mouth at  bedtime.   06/21/2024   methylPREDNISolone  sodium succinate (SOLU-MEDROL ) 125 mg/2 mL injection Inject 125 mg into the muscle once.   Taking   metoCLOPramide  (REGLAN ) 10 MG tablet Take 0.5 tablets (5 mg total) by mouth every 8 (eight) hours as needed for nausea or vomiting.   06/22/2024   midodrine  (PROAMATINE ) 5 MG tablet Take 5 mg by mouth 3 (three) times daily with meals as needed.   Taking As Needed   ondansetron  (ZOFRAN ) 4 MG tablet Take 4 mg by mouth every 8 (eight) hours as needed for nausea or vomiting.   Taking As Needed   Oxycodone  HCl 10 MG TABS Take 0.5-1 tablets (5-10 mg total) by mouth every 6 (six) hours as needed. 30 tablet 0 06/22/2024   polyethylene glycol (MIRALAX  / GLYCOLAX ) 17 g packet Take 17 g by mouth 2 (two) times daily. 14 each 0 06/22/2024 Morning   senna-docusate (SENOKOT-S) 8.6-50 MG tablet Take 1 tablet by mouth 2 (two) times daily.   06/22/2024 Morning   simethicone  (MYLICON) 80 MG chewable tablet Chew 1 tablet (80 mg total) by mouth 4 (four) times daily.   06/22/2024   sitaGLIPtin (JANUVIA) 25 MG tablet Take 25 mg by mouth daily.   06/22/2024   Vitamin D , Ergocalciferol , (DRISDOL ) 1.25 MG (50000 UNIT) CAPS capsule Take 1 capsule (50,000 Units total) by mouth every 7 (seven) days.   06/19/2024   hydrocortisone  cream 1 % Apply 1 Application topically daily. (Patient not taking: Reported on 06/22/2024)   Not Taking   Scheduled:   carvedilol   6.25 mg Oral BID WC   Chlorhexidine  Gluconate Cloth  6 each Topical Q0600   diltiazem   240 mg Oral QPM   insulin  aspart  0-5 Units Subcutaneous QHS   insulin  aspart  0-6 Units Subcutaneous TID WC   insulin  glargine  12 Units Subcutaneous BID   levothyroxine   175 mcg Oral Q0600   melatonin  10 mg Oral QHS   Infusions:   azithromycin  500 mg (06/23/24 1213)   cefTRIAXone  (ROCEPHIN )  IV 2 g (06/23/24 1214)   heparin  1,700 Units/hr (06/23/24 0435)    Assessment: 63 yo M to start heparin  drip for NSTEMI.  Admitted w/ SHOB, AMS, sepsis.    Yk:fnmapi obesity, diabetes, CHF, chronic hypoxic respiratory failure on 5 to 6 L, ESRD on HD, SVT, neurogenic bladder s/p suprapubic catheter, and chronic pain syndrome  No anticoagulation PTA per med rec and Rx fill hx  Baseline:  Hgb 9.7  Plt 214   INR 1.1  aPTT 114 (drawn after heparin  drip started)    Goal of Therapy:  Heparin  level 0.3-0.7 units/ml Monitor platelets by anticoagulation protocol: Yes  0930 0347 HL 0.15 0930 1123 HL 0.22   Plan:  - Heparin  bolus 1550 units x 1 - Increase heparin  infusion to 1900 units/hr - Will recheck HL in 6 hr after rate change - CBC daily while on heparin    Ransom Blanch PGY-1 Pharmacy Resident  [place] [facility]  06/23/2024 12:34 PM

## 2024-06-23 NOTE — Progress Notes (Signed)
 [facility]  ROUNDING NOTE   Subjective:   Eugene [name] is a 63 y.o. male with past medical conditions including diabetes, bilateral BKA, suprapubic catheter, hepatitis C, and end-stage renal disease on hemodialysis.  Patient presented to the emergency department from nursing facility with altered mental status and respiratory distress.  Patient currently admitted to ICU for Anasarca [R60.1] Severe sepsis with acute organ dysfunction (HCC) [A41.9, R65.20] Acute on chronic respiratory failure with hypoxia (HCC) [J96.21] Altered mental status, unspecified altered mental status type [R41.82] Pneumonia due to infectious organism, unspecified laterality, unspecified part of lung [J18.9]  Patient is known to our practice and receives outpatient dialysis treatments at [facility] on a MWF schedule, supervised by Dr. Dennise.  Last treatment completed on Monday.  According to outpatient records, patient has not missed any treatments recently.  Patient seen and evaluated at bedside in ICU.  Patient currently awake, alert and oriented.  Currently on high flow nasal cannula at 8 L.  Able to hold conversation comfortably.  Complains of mild generalized pain.  Labs on ED arrival concerning for sodium 133, potassium 5.4, serum bicarb 20, BUN 63, creatinine 6.07 with GFR 10, BNP 669 and troponin 87.  White count elevated at 15.1 with hemoglobin 9.7.  Respiratory panel negative for influenza, COVID-19, and RSV.  UA appears turbid with some leukocytes, protein, and bacteria.  Urine culture positive for E. coli.  Chest x-ray shows small left greater than right pleural effusions.  CT head negative for acute findings.  We have been consulted to manage dialysis needs during this admission.   Objective:  Vital signs in last 24 hours:  Temp:  [97.6 F (36.4 C)-98.7 F (37.1 C)] 98.7 F (37.1 C) (09/30 0820) Pulse Rate:  [72-103] 82 (09/30 1405) Resp:  [13-23] 18 (09/30 1405) BP:  (86-164)/(38-113) 132/75 (09/30 1405) SpO2:  [82 %-98 %] 93 % (09/30 1405)  Weight change:  Filed Weights   06/22/24 1537  Weight: 136.1 kg    Intake/Output: I/O last 3 completed shifts: In: 152 [I.V.:152] Out: 1500 [Other:1500]   Intake/Output this shift:  Total I/O In: 197.6 [I.V.:98; IV Piggyback:99.6] Out: 500 [Urine:500]  Physical Exam: General: NAD  Head: Normocephalic, atraumatic. Moist oral mucosal membranes  Eyes: Anicteric  Neck: Supple  Lungs:  Diminished, HFNC  Heart: Regular rate and rhythm  Abdomen:  Soft, nontender, obese  Extremities: 1+ peripheral edema.  Bilateral AKA  Neurologic: Awake, alert, conversant  Skin: Warm,dry, no rash  Access: Right IJ PermCath    Basic Metabolic Panel: Recent Labs  Lab 06/22/24 1553 06/23/24 0347  NA 133* 131*  K 5.4* 4.7  CL 99 97*  CO2 20* 23  GLUCOSE 335* 373*  BUN 63* 51*  CREATININE 6.07* 4.65*  CALCIUM  7.7* 7.5*  MG 2.5*  --   PHOS 7.1*  --     Liver Function Tests: Recent Labs  Lab 06/22/24 1553  AST 114*  ALT 102*  ALKPHOS 70  BILITOT 0.6  PROT 7.0  ALBUMIN  2.4*   No results for input(s): LIPASE, AMYLASE in the last 168 hours. No results for input(s): AMMONIA in the last 168 hours.  CBC: Recent Labs  Lab 06/22/24 1553 06/23/24 0347  WBC 15.1* 19.2*  NEUTROABS 11.4*  --   HGB 9.7* 9.1*  HCT 31.7* 29.9*  MCV 96.4 95.5  PLT 214 210    Cardiac Enzymes: No results for input(s): CKTOTAL, CKMB, CKMBINDEX, TROPONINI in the last 168 hours.  BNP: Invalid input(s): POCBNP  CBG: Recent Labs  Lab 06/23/24 0351 06/23/24 0732 06/23/24 0829 06/23/24 1122 06/23/24 1626  GLUCAP 321* 315* 346* 363* 358*    Microbiology: Results for orders placed or performed during the hospital encounter of 06/22/24  Culture, blood (routine x 2)     Status: None (Preliminary result)   Collection Time: 06/22/24  3:53 PM   Specimen: BLOOD  Result Value Ref Range Status   Specimen  Description BLOOD BLOOD LEFT ARM  Final   Special Requests   Final    BOTTLES DRAWN AEROBIC AND ANAEROBIC Blood Culture adequate volume   Culture   Final    NO GROWTH < 24 HOURS Performed at [facility], [address] [place], [place] [address]    Report Status PENDING  Incomplete  Resp panel by RT-PCR (RSV, Flu A&B, Covid) Anterior Nasal Swab     Status: None   Collection Time: 06/22/24  3:53 PM   Specimen: Anterior Nasal Swab  Result Value Ref Range Status   SARS Coronavirus 2 by RT PCR NEGATIVE NEGATIVE Final    Comment: (NOTE) SARS-CoV-2 target nucleic acids are NOT DETECTED.  The SARS-CoV-2 RNA is generally detectable in upper respiratory specimens during the acute phase of infection. The lowest concentration of SARS-CoV-2 viral copies this assay can detect is 138 copies/mL. A negative result does not preclude SARS-Cov-2 infection and should not be used as the sole basis for treatment or other patient management decisions. A negative result may occur with  improper specimen collection/handling, submission of specimen other than nasopharyngeal swab, presence of viral mutation(s) within the areas targeted by this assay, and inadequate number of viral copies(<138 copies/mL). A negative result must be combined with clinical observations, patient history, and epidemiological information. The expected result is Negative.  Fact Sheet for Patients:  BloggerCourse.com  Fact Sheet for Healthcare Providers:  SeriousBroker.it  This test is no t yet approved or cleared by the [place]  FDA and  has been authorized for detection and/or diagnosis of SARS-CoV-2 by FDA under an Emergency Use Authorization (EUA). This EUA will remain  in effect (meaning this test can be used) for the duration of the COVID-19 declaration under Section 564(b)(1) of the Act, 21 U.S.C.section 360bbb-3(b)(1), unless the authorization is  terminated  or revoked sooner.       Influenza A by PCR NEGATIVE NEGATIVE Final   Influenza B by PCR NEGATIVE NEGATIVE Final    Comment: (NOTE) The Xpert Xpress SARS-CoV-2/FLU/RSV plus assay is intended as an aid in the diagnosis of influenza from Nasopharyngeal swab specimens and should not be used as a sole basis for treatment. Nasal washings and aspirates are unacceptable for Xpert Xpress SARS-CoV-2/FLU/RSV testing.  Fact Sheet for Patients: BloggerCourse.com  Fact Sheet for Healthcare Providers: SeriousBroker.it  This test is not yet approved or cleared by the [place]  FDA and has been authorized for detection and/or diagnosis of SARS-CoV-2 by FDA under an Emergency Use Authorization (EUA). This EUA will remain in effect (meaning this test can be used) for the duration of the COVID-19 declaration under Section 564(b)(1) of the Act, 21 U.S.C. section 360bbb-3(b)(1), unless the authorization is terminated or revoked.     Resp Syncytial Virus by PCR NEGATIVE NEGATIVE Final    Comment: (NOTE) Fact Sheet for Patients: BloggerCourse.com  Fact Sheet for Healthcare Providers: SeriousBroker.it  This test is not yet approved or cleared by the [place]  FDA and has been authorized for detection and/or diagnosis of SARS-CoV-2 by FDA under an Emergency  Use Authorization (EUA). This EUA will remain in effect (meaning this test can be used) for the duration of the COVID-19 declaration under Section 564(b)(1) of the Act, 21 U.S.C. section 360bbb-3(b)(1), unless the authorization is terminated or revoked.  Performed at [facility], [address] [place], [place] [address]   Urine Culture     Status: Abnormal (Preliminary result)   Collection Time: 06/22/24  4:00 PM   Specimen: Urine, Suprapubic  Result Value Ref Range Status   Specimen Description   Final     URINE, SUPRAPUBIC Performed at [facility], [address] [place], [place] [address]    Special Requests   Final    NONE Performed at [facility], [address] [place], [place] [address]    Culture (A)  Final    >=100,000 COLONIES/mL ESCHERICHIA COLI >=100,000 COLONIES/mL PROVIDENCIA STUARTII SUSCEPTIBILITIES TO FOLLOW Performed at [facility], [address] [place], [place] [address]    Report Status PENDING  Incomplete    Coagulation Studies: Recent Labs    06/22/24 1944  LABPROT 14.5  INR 1.1    Urinalysis: Recent Labs    06/22/24 1600  COLORURINE YELLOW*  LABSPEC 1.027  PHURINE 6.0  GLUCOSEU 50*  HGBUR NEGATIVE  BILIRUBINUR NEGATIVE  KETONESUR NEGATIVE  PROTEINUR 100*  NITRITE NEGATIVE  LEUKOCYTESUR MODERATE*      Imaging: CT CHEST WO CONTRAST Result Date: 06/23/2024 CLINICAL DATA:  Chronic dyspnea EXAM: CT CHEST WITHOUT CONTRAST TECHNIQUE: Multidetector CT imaging of the chest was performed following the standard protocol without IV contrast. RADIATION DOSE REDUCTION: This exam was performed according to the departmental dose-optimization program which includes automated exposure control, adjustment of the mA and/or kV according to patient size and/or use of iterative reconstruction technique. COMPARISON:  June 22, 2024 and prior studies FINDINGS: Cardiovascular: Normal caliber thoracic aorta. Right IJ approach tunneled central venous catheter with distal tip at the cavoatrial junction. Coronary artery calcifications. Mediastinum/Nodes: No lymphadenopathy. Lungs/Pleura: Similar appearance of bilateral lower lobe consolidations. Small partially loculated left and trace right pleural effusions, similar to prior. Scattered areas with interlobular septal thickening and few areas of ground-glass may be related to mild interstitial pulmonary edema. Upper Abdomen: No acute findings. Musculoskeletal: Unchanged T5-T11 posterior spinal  fixation hardware. Associated streak artifact limits evaluation of the posterior chest wall. IMPRESSION: 1. Unchanged chronic bilateral lower lobe consolidations, nonspecific and possibly representing chronic atelectasis. Small partially loculated left and trace right pleural effusions, similar to prior chest CT. 2.  Findings suggestive of mild interstitial pulmonary edema. Electronically Signed   By: Michaeline Blanch M.D.   On: 06/23/2024 11:03   CT Head Wo Contrast Result Date: 06/22/2024 CLINICAL DATA:  Altered mental status.  Hallucination. EXAM: CT HEAD WITHOUT CONTRAST TECHNIQUE: Contiguous axial images were obtained from the base of the skull through the vertex without intravenous contrast. RADIATION DOSE REDUCTION: This exam was performed according to the departmental dose-optimization program which includes automated exposure control, adjustment of the mA and/or kV according to patient size and/or use of iterative reconstruction technique. COMPARISON:  05/11/2024 FINDINGS: Brain: Ventricles, cisterns and other CSF spaces are normal. No mass, mass effect, shift of midline structures or acute hemorrhage. No evidence of acute infarction. Vascular: No hyperdense vessel or unexpected calcification. Skull: Normal. Negative for fracture or focal lesion. Sinuses/Orbits: Orbits are normal. Paranasal sinuses are well aerated and otherwise clear. Other: None. IMPRESSION: No acute findings. Electronically Signed   By: Toribio Agreste M.D.  On: 06/22/2024 17:32   DG Chest Portable 1 View Result Date: 06/22/2024 CLINICAL DATA:  Shortness of breath EXAM: PORTABLE CHEST 1 VIEW COMPARISON:  05/27/2024, CT 05/11/2024, 04/19/2024, 10/22/2019 FINDINGS: Limited by patient positioning. Right-sided central venous. Similar small left greater than right pleural effusions with probable loculation on the left. Persistent bibasilar airspace disease. No pneumothorax IMPRESSION: Similar small left greater than right pleural effusions  with probable loculation on the left. Persistent bibasilar airspace disease. Cardiomegaly Electronically Signed   By: Luke Bun M.D.   On: 06/22/2024 16:24     Medications:    azithromycin  500 mg (06/23/24 1213)   cefTRIAXone  (ROCEPHIN )  IV Stopped (06/23/24 1245)   heparin  1,900 Units/hr (06/23/24 1312)    carvedilol   6.25 mg Oral BID WC   Chlorhexidine  Gluconate Cloth  6 each Topical Q0600   diltiazem   240 mg Oral QPM   insulin  aspart  0-15 Units Subcutaneous TID WC   insulin  aspart  0-5 Units Subcutaneous QHS   insulin  glargine  12 Units Subcutaneous BID   levothyroxine   175 mcg Oral Q0600   melatonin  10 mg Oral QHS   acetaminophen  **OR** acetaminophen , hydrALAZINE , nicotine , ondansetron  **OR** ondansetron  (ZOFRAN ) IV, senna-docusate  Assessment/ Plan:  [name] is a 63 y.o.  male with past medical conditions including diabetes, bilateral BKA, suprapubic catheter, hepatitis C, and end-stage renal disease on hemodialysis.  Patient presented to the emergency department from nursing facility with altered mental status and respiratory distress.  Patient currently admitted to ICU for Anasarca [R60.1] Severe sepsis with acute organ dysfunction (HCC) [A41.9, R65.20] Acute on chronic respiratory failure with hypoxia (HCC) [J96.21] Altered mental status, unspecified altered mental status type [R41.82] Pneumonia due to infectious organism, unspecified laterality, unspecified part of lung [J18.9]  [facility]/MWF/right PermCath  End-stage renal disease with hyperkalemia on hemodialysis.  Last outpatient treatment received on Monday.  Potassium 5.4.  BUN also elevated, 63, could account for altered mental status and hallucinations.  Patient did receive urgent dialysis late yesterday to correct potassium and volume status.  Next treatment scheduled for Wednesday.  2.  Acute respiratory failure, patient placed on BiPAP on ED arrival.  Was able to wean BiPAP after dialysis  treatment.  Remains on 8 L high flow nasal cannula.  Will continue to manage volume status with dialysis.  3. Anemia of chronic kidney disease Lab Results  Component Value Date   HGB 9.1 (L) 06/23/2024    Hemoglobin acceptable.  Could consider low-dose ESA with dialysis.  Will assess a.m. labs.  4. Diabetes mellitus type II with chronic kidney disease/renal manifestations: insulin  dependent. Home regimen includes glargine and lispro. Most recent hemoglobin A1c is 6.0 on 04/14/2024.      LOS: 1 Eugene [name] 9/30/20255:51 PM

## 2024-06-23 NOTE — Progress Notes (Signed)
 PHARMACY - ANTICOAGULATION CONSULT NOTE  Pharmacy Consult for heparin  drip  Indication: chest pain/ACS/NSTEMI  No Known Allergies  Patient Measurements: Height: 5' 10 (177.8 cm) Weight: 136.1 kg (300 lb) IBW/kg (Calculated) : 73 HEPARIN  DW (KG): 104.7  Vital Signs: Temp: 97.6 F (36.4 C) (09/30 0025) Temp Source: Axillary (09/30 0025) BP: 126/83 (09/30 0330) Pulse Rate: 80 (09/30 0330)  Labs: Recent Labs    06/22/24 1553 06/22/24 1823 06/22/24 1944 06/23/24 0347  HGB 9.7*  --   --  9.1*  HCT 31.7*  --   --  29.9*  PLT 214  --   --  210  APTT  --   --  114*  --   LABPROT  --   --  14.5  --   INR  --   --  1.1  --   HEPARINUNFRC  --   --   --  0.15*  CREATININE 6.07*  --   --  4.65*  TROPONINIHS 87* 196*  --   --     Estimated Creatinine Clearance: 23.2 mL/min (A) (by C-G formula based on SCr of 4.65 mg/dL (H)).   Medical History: Past Medical History:  Diagnosis Date   Anxiety    Depression    Diabetes mellitus without complication (HCC)    Hepatitis C    Hyperlipidemia    IBS (irritable bowel syndrome)    Osteomyelitis (HCC)    Spinal stenosis     Medications:  (Not in a hospital admission)  Scheduled:   carvedilol   6.25 mg Oral BID WC   Chlorhexidine  Gluconate Cloth  6 each Topical Q0600   diltiazem   240 mg Oral QPM   insulin  aspart  0-5 Units Subcutaneous QHS   insulin  aspart  0-6 Units Subcutaneous TID WC   insulin  glargine  10 Units Subcutaneous BID   levothyroxine   175 mcg Oral Q0600   melatonin  10 mg Oral QHS   Infusions:   azithromycin      cefTRIAXone  (ROCEPHIN )  IV     heparin  1,400 Units/hr (06/23/24 0354)    Assessment: 63 yo M to start heparin  drip for NSTEMI.  Admitted w/ SHOB, AMS, sepsis.   Yk:fnmapi obesity, diabetes, CHF, chronic hypoxic respiratory failure on 5 to 6 L, ESRD on HD, SVT, neurogenic bladder s/p suprapubic catheter, and chronic pain syndrome  No anticoagulation PTA per med rec and Rx fill hx Baseline:  Hgb 9.7   Plt 214   INR 1.1  aPTT 114 (drawn after heparin  drip started)    Goal of Therapy:  Heparin  level 0.3-0.7 units/ml Monitor platelets by anticoagulation protocol: Yes  0930 0347 HL 0.15   Plan:  Bolus 3100 units x 1 Increase heparin  infusion to 1700 units/hr Will recheck HL in 6 hr after rate change CBC daily while on heparin   Rankin CANDIE Dills, PharmD, [facility] 06/23/2024 4:26 AM

## 2024-06-23 NOTE — Progress Notes (Signed)
   06/23/24 0025  Vitals  Temp 97.6 F (36.4 C)  Temp Source Axillary  BP 139/62  MAP (mmHg) 81  BP Location Right Wrist  BP Method Automatic  Patient Position (if appropriate) Lying  Pulse Rate 75  Pulse Rate Source Monitor  ECG Heart Rate 75  Resp 15  Oxygen Therapy  SpO2 97 %  O2 Device Bi-PAP  During Treatment Monitoring  Blood Flow Rate (mL/min) 0 mL/min  Arterial Pressure (mmHg) -68.28 mmHg  Venous Pressure (mmHg) -21.82 mmHg  TMP (mmHg) 29.09 mmHg  Ultrafiltration Rate (mL/min) 863 mL/min  Dialysate Flow Rate (mL/min) 299 ml/min  Dialysate Potassium Concentration 2  Dialysate Calcium  Concentration 2.5  Duration of HD Treatment -hour(s) 3 hour(s)  Cumulative Fluid Removed (mL) per Treatment  1543.06  Intra-Hemodialysis Comments Tx completed  Post Treatment  Dialyzer Clearance Lightly streaked  Liters Processed 61.7  Fluid Removed (mL) 1500 mL  Tolerated HD Treatment Yes  Post-Hemodialysis Comments  (no blood return from arterial port)

## 2024-06-23 NOTE — ED Notes (Signed)
 Pt heard from hallway to be yelling and cussing at RN.

## 2024-06-23 NOTE — TOC Initial Note (Signed)
 Transition of Care [facility]) - Initial/Assessment Note    Patient Details  Name: Eugene [name] MRN: [id number] Date of Birth: [birth year]  Transition of [facility]) CM/SW Contact:    Alfonso Rummer, LCSW Phone Number: 06/23/2024, 10:31 AM  Clinical Narrative:                  Pt has no current TOC needs at this time. Please contact should needs arise.  Expected Discharge Plan: Long Term Nursing Home ([facility]) Barriers to Discharge: Continued Medical Work up   Patient Goals and CMS Choice            Expected Discharge Plan and Services In-house Referral: Clinical Social Work Discharge Planning Services: CM Consult   Living arrangements for the past 2 months: Skilled Nursing Facility                                      Prior Living Arrangements/Services Living arrangements for the past 2 months: Skilled Nursing Facility Lives with:: Facility Resident   Do you feel safe going back to the place where you live?: Yes      Need for Family Participation in Patient Care: No (Comment) Care giver support system in place?: Yes (comment) Museum/gallery conservator)   Criminal Activity/Legal Involvement Pertinent to Current Situation/Hospitalization: No - Comment as needed  Activities of Daily Living      Permission Sought/Granted                  Emotional Assessment Appearance:: Appears stated age   Affect (typically observed): Appropriate   Alcohol / Substance Use: Not Applicable Psych Involvement: No (comment)  Admission diagnosis:  Anasarca [R60.1] Severe sepsis with acute organ dysfunction (HCC) [A41.9, R65.20] Acute on chronic respiratory failure with hypoxia (HCC) [J96.21] Altered mental status, unspecified altered mental status type [R41.82] Pneumonia due to infectious organism, unspecified laterality, unspecified part of lung [J18.9] Patient Active Problem List   Diagnosis Date Noted   Severe sepsis with acute organ dysfunction (HCC)  06/22/2024   Obesity, Class III, BMI 40-49.9 (morbid obesity) 06/22/2024   Elevated troponin 06/22/2024   SVT (supraventricular tachycardia) 05/25/2024   ESRD (end stage renal disease) (HCC) 05/25/2024   Hypoxia 05/25/2024   Thrombocytopenia 05/24/2024   Class 3 obesity 05/22/2024   Bronchospasm 05/22/2024   Anasarca associated with disorder of kidney 05/09/2024   Type 2 diabetes mellitus with peripheral neuropathy (HCC) 05/03/2024   Essential hypertension 05/03/2024   Hypothyroidism 05/03/2024   IBS (irritable bowel syndrome) 05/03/2024   Acute on chronic diastolic CHF (congestive heart failure) (HCC) 05/03/2024   CKD (chronic kidney disease) stage 4, GFR 15-29 ml/min (HCC) 05/03/2024   Anaphylaxis 05/02/2024   Ileus (HCC) 04/23/2024   Acute respiratory failure with hypoxia (HCC) 04/19/2024   Acid-base disorder, mixed 04/16/2024   Acute kidney injury superimposed on CKD 04/15/2024   Diabetic gastroparesis (HCC) 04/17/2021   Suprapubic catheter dysfunction    Pain    Constipation by delayed colonic transit    Nausea and vomiting 10/21/2019   Suprapubic catheter (HCC)    Sepsis (HCC) 10/02/2019   Thoracic spinal stenosis 02/12/2019   Osteomyelitis of thoracic spine (HCC) 02/08/2019   Peripheral vascular disease 02/08/2019   HTN (hypertension) 02/08/2019   Dyslipidemia 02/08/2019   Chronic pain syndrome 02/08/2019   Polysubstance abuse (HCC) 02/08/2019   Chronic hepatitis C without hepatic coma (HCC) 02/08/2019   GERD (gastroesophageal reflux  disease) 02/08/2019   Cigarette smoker 02/08/2019   Normocytic anemia 02/08/2019   Catheter-associated urinary tract infection 02/07/2019   AKI (acute kidney injury) 02/07/2019   Hyperkalemia 02/07/2019   Pressure injury of skin 02/07/2019   Hyperlipidemia 11/07/2018   Drug-seeking behavior 11/07/2018   Abnormality of gait 10/17/2018   S/P bilateral BKA (below knee amputation) (HCC) 10/17/2018   Abdominal pain 03/24/2018   Acute  pain of left knee 01/23/2018   MRSA (methicillin resistant Staphylococcus aureus) septicemia (HCC) 08/17/2017   Bacteremia due to Staphylococcus aureus 08/15/2017   Other acute osteomyelitis, left ankle and foot (HCC) 06/29/2017   Diabetic ulcer of right foot associated with diabetes mellitus due to underlying condition (HCC) 06/27/2017   Cellulitis of left foot 06/27/2017   Tobacco use disorder 06/27/2017   Type 2 diabetes mellitus with hyperglycemia, with long-term current use of insulin  (HCC) 06/18/2017   PCP:  [facility]:   Lorine [address]usto [place], [place] - [address] [place] [place] [place] [address] Phone: [phone] Fax: [phone]     Social Drivers of Health (SDOH) Social History: SDOH Screenings   Food Insecurity: Patient Declined (05/21/2024)  Housing: Low Risk  (05/22/2024)  Transportation Needs: No Transportation Needs (05/22/2024)  Utilities: Not At Risk (05/22/2024)  Financial Resource Strain: Medium Risk (02/02/2019)  Physical Activity: Unknown (02/02/2019)  Social Connections: Somewhat Isolated (02/02/2019)  Stress: Stress Concern Present (02/02/2019)  Tobacco Use: High Risk (06/22/2024)   SDOH Interventions:     Readmission Risk Interventions    05/05/2024    3:05 PM  Readmission Risk Prevention Plan  Transportation Screening Complete  Medication Review (RN Care Manager) Complete  PCP or Specialist appointment within 3-5 days of discharge Complete  SW Recovery Care/Counseling Consult Complete  Palliative Care Screening Not Applicable  Skilled Nursing Facility Complete

## 2024-06-23 NOTE — Consult Note (Signed)
 [facility] CLINIC CARDIOLOGY CONSULT NOTE       Patient ID: Eugene [name] MRN: [id number] DOB/AGE: 1961-03-30 63 y.o.  Admit date: 06/22/2024 Referring Physician Dr. Drue Potter Primary Physician Care, [facility]  Primary Cardiologist None Reason for Consultation elevated troponin  HPI: Eugene [name] is a 63 y.o. male  with a past medical history of morbid obesity, end-stage renal disease on hemodialysis, insulin -dependent diabetes mellitus 2, hypothyroid, hypertension who presented to the ED on 06/22/2024 for SOB and altered mental status. Troponins found to be elevated in the setting of sepsis. Cardiology was consulted for further evaluation.   Patient was noted to be SOB and altered yesterday at his ALF and given this was brought to the ED for evaluation. Workup in the ED notable for creatinine 6.07, potassium 5.4, hemoglobin 9.7, WBC 15.1. Troponins 87 > 196 > 260, BNP 669. EKG in the ED NSR 1st degree AVB rate 86 bpm. CXR in the ED with bilateral pleural effusions. Underwent HD yesterday evening due to concern for volume overload. Started on IV antibiotics and BiPAP in the ED.  At the time of my evaluation, he is sitting upright in bed eating lunch. Denies any CP, SOB, palpitations. States yesterday he just did not feel well but could not elaborate further. Remembers speaking to his brother yesterday evening and then being brought to the ED. Overall feeling better today. Still on supplemental O2.   Review of systems complete and found to be negative unless listed above    Past Medical History:  Diagnosis Date   Anxiety    Depression    Diabetes mellitus without complication (HCC)    Hepatitis C    Hyperlipidemia    IBS (irritable bowel syndrome)    Osteomyelitis (HCC)    Spinal stenosis     Past Surgical History:  Procedure Laterality Date   BACK SURGERY     bilateral amputation Bilateral    CENTRAL LINE INSERTION-TUNNELED N/A 02/02/2019   Procedure: CENTRAL LINE  INSERTION-TUNNELED;  Surgeon: Marea Selinda RAMAN, MD;  Location: [facility] INVASIVE CV LAB;  Service: Cardiovascular;  Laterality: N/A;   COLONOSCOPY WITH PROPOFOL  N/A 01/12/2020   Procedure: COLONOSCOPY WITH PROPOFOL ;  Surgeon: Jinny Carmine, MD;  Location: [facility] ENDOSCOPY;  Service: Endoscopy;  Laterality: N/A;   DIALYSIS/PERMA CATHETER INSERTION N/A 05/22/2024   Procedure: DIALYSIS/PERMA CATHETER INSERTION;  Surgeon: Jama Cordella MATSU, MD;  Location: [facility] INVASIVE CV LAB;  Service: Cardiovascular;  Laterality: N/A;   ESOPHAGOGASTRODUODENOSCOPY (EGD) WITH PROPOFOL  N/A 12/07/2019   Procedure: ESOPHAGOGASTRODUODENOSCOPY (EGD) WITH PROPOFOL ;  Surgeon: Unk Corinn Skiff, MD;  Location: [facility] ENDOSCOPY;  Service: Gastroenterology;  Laterality: N/A;   IR CATHETER TUBE CHANGE  12/04/2019   SPINAL FUSION     THORACIC SPINE SURGERY  01/2019   extensive washout    Medications Prior to Admission  Medication Sig Dispense Refill Last Dose/Taking   acetaminophen  (TYLENOL ) 325 MG tablet Take 650 mg by mouth in the morning and at bedtime.   06/22/2024   bisacodyl  (DULCOLAX) 10 MG suppository Place 10 mg rectally daily as needed for moderate constipation.   Taking As Needed   bisacodyl  (DULCOLAX) 5 MG EC tablet Take 2 tablets (10 mg total) by mouth at bedtime.   06/21/2024   carvedilol  (COREG ) 6.25 MG tablet Take 1 tablet (6.25 mg total) by mouth 2 (two) times daily with a meal. (Patient taking differently: Take 6.25 mg by mouth 2 (two) times daily with a meal. Every Tuesday, Thursday, Saturday and sunday)   06/21/2024  cyanocobalamin  1000 MCG tablet Take 1 tablet (1,000 mcg total) by mouth daily.   06/22/2024   diltiazem  (CARDIZEM  CD) 240 MG 24 hr capsule Take 1 capsule (240 mg total) by mouth every evening.   06/21/2024   Doxepin  HCl 6 MG TABS Take 6 mg by mouth at bedtime.   06/21/2024   famotidine  (PEPCID ) 10 MG tablet Take 1 tablet (10 mg total) by mouth daily.   06/22/2024   folic acid  (FOLVITE ) 1 MG tablet Take 1  tablet (1 mg total) by mouth daily.   06/22/2024   furosemide  (LASIX ) 80 MG tablet Take 1 tablet (80 mg total) by mouth daily. (Patient taking differently: Take 80 mg by mouth at bedtime.)   06/21/2024   gabapentin  (NEURONTIN ) 300 MG capsule Take 300 mg by mouth at bedtime.   06/21/2024   guaifenesin (ROBITUSSIN) 100 MG/5ML syrup Take 200 mg by mouth 3 (three) times daily as needed for cough.   Taking As Needed   insulin  glargine-yfgn (SEMGLEE ) 100 UNIT/ML injection Inject 0.1 mLs (10 Units total) into the skin 2 (two) times daily. (Patient taking differently: Inject 12-14 Units into the skin 2 (two) times daily.)   06/22/2024 Morning   insulin  lispro (HUMALOG ) 100 UNIT/ML injection Inject 0-15 Units into the skin 4 (four) times daily -  before meals and at bedtime. <150= 0 units 151-200= 3 units 201-250= 6 units 251-300= 9 units 301-350= 12 units 351-400= 15 units >400 call MD (Patient taking differently: Inject 0-15 Units into the skin 4 (four) times daily -  before meals and at bedtime. <150= 0 units 151-200= 3 units 201-250= 6 units 251-300= 9 units 301-350= 12 units 351-400= 15 units >400 call MD)   06/22/2024   ipratropium-albuterol  (DUONEB) 0.5-2.5 (3) MG/3ML SOLN Take 3 mLs by nebulization every 6 (six) hours as needed.   Taking As Needed   Lactulose  20 GM/30ML SOLN Take 30 mLs by mouth 2 (two) times daily.   06/22/2024   levothyroxine  (SYNTHROID ) 175 MCG tablet Take 175 mcg by mouth at bedtime.   06/21/2024   linaclotide  (LINZESS ) 145 MCG CAPS capsule Take 1 capsule (145 mcg total) by mouth daily before breakfast. 30 capsule 0 06/22/2024   Melatonin 5 MG TABS Take 10 mg by mouth at bedtime.   06/21/2024   methylPREDNISolone  sodium succinate (SOLU-MEDROL ) 125 mg/2 mL injection Inject 125 mg into the muscle once.   Taking   metoCLOPramide  (REGLAN ) 10 MG tablet Take 0.5 tablets (5 mg total) by mouth every 8 (eight) hours as needed for nausea or vomiting.   06/22/2024   midodrine  (PROAMATINE ) 5  MG tablet Take 5 mg by mouth 3 (three) times daily with meals as needed.   Taking As Needed   ondansetron  (ZOFRAN ) 4 MG tablet Take 4 mg by mouth every 8 (eight) hours as needed for nausea or vomiting.   Taking As Needed   Oxycodone  HCl 10 MG TABS Take 0.5-1 tablets (5-10 mg total) by mouth every 6 (six) hours as needed. 30 tablet 0 06/22/2024   polyethylene glycol (MIRALAX  / GLYCOLAX ) 17 g packet Take 17 g by mouth 2 (two) times daily. 14 each 0 06/22/2024 Morning   senna-docusate (SENOKOT-S) 8.6-50 MG tablet Take 1 tablet by mouth 2 (two) times daily.   06/22/2024 Morning   simethicone  (MYLICON) 80 MG chewable tablet Chew 1 tablet (80 mg total) by mouth 4 (four) times daily.   06/22/2024   sitaGLIPtin (JANUVIA) 25 MG tablet Take 25 mg by mouth daily.  06/22/2024   Vitamin D , Ergocalciferol , (DRISDOL ) 1.25 MG (50000 UNIT) CAPS capsule Take 1 capsule (50,000 Units total) by mouth every 7 (seven) days.   06/19/2024   hydrocortisone  cream 1 % Apply 1 Application topically daily. (Patient not taking: Reported on 06/22/2024)   Not Taking   Social History   Socioeconomic History   Marital status: Single    Spouse name: Not on file   Number of children: Not on file   Years of education: Not on file   Highest education level: Not on file  Occupational History   Not on file  Tobacco Use   Smoking status: Some Days    Current packs/day: 0.25    Types: Cigarettes   Smokeless tobacco: Never  Vaping Use   Vaping status: Never Used  Substance and Sexual Activity   Alcohol use: Not Currently   Drug use: Not Currently   Sexual activity: Not Currently  Other Topics Concern   Not on file  Social History Narrative   Resides at [employer]   Social Drivers of Health   Financial Resource Strain: Medium Risk (02/02/2019)   Overall Financial Resource Strain (CARDIA)    Difficulty of Paying Living Expenses: Somewhat hard  Food Insecurity: Patient Declined (05/21/2024)   Hunger Vital Sign     Worried About Running Out of Food in the Last Year: Patient declined    Ran Out of Food in the Last Year: Patient declined  Transportation Needs: No Transportation Needs (05/22/2024)   PRAPARE - Administrator, Civil Service (Medical): No    Lack of Transportation (Non-Medical): No  Physical Activity: Unknown (02/02/2019)   Exercise Vital Sign    Days of Exercise per Week: 0 days    Minutes of Exercise per Session: Not on file  Stress: Stress Concern Present (02/02/2019)   [employer] of Occupational Health - Occupational Stress Questionnaire    Feeling of Stress : To some extent  Social Connections: Somewhat Isolated (02/02/2019)   Social Connection and Isolation Panel    Frequency of Communication with Friends and Family: More than three times a week    Frequency of Social Gatherings with Friends and Family: More than three times a week    Attends Religious Services: More than 4 times per year    Active Member of [employer] or Organizations: Not on file    Attends Banker Meetings: Never    Marital Status: Never married  Intimate Partner Violence: Patient Declined (05/22/2024)   Humiliation, Afraid, Rape, and Kick questionnaire    Fear of Current or Ex-Partner: Patient declined    Emotionally Abused: Patient declined    Physically Abused: Patient declined    Sexually Abused: Patient declined    Family History  Problem Relation Age of Onset   Kidney failure Father    Kidney failure Brother      Vitals:   06/23/24 0345 06/23/24 0624 06/23/24 0700 06/23/24 0715  BP: 92/78 139/69 131/74 126/63  Pulse: 78 76 72   Resp: 14 20  18   Temp:  98.6 F (37 C)    TempSrc:  Oral    SpO2: 90% 96% 94%   Weight:      Height:        PHYSICAL EXAM General: Chronically ill appearing male, well nourished, in no acute distress. HEENT: Normocephalic and atraumatic. Neck: No JVD.  Lungs: Normal respiratory effort on HFNC. Clear bilaterally to auscultation. Bibasilar  crackles Heart: HRRR. Normal S1 and S2 without  gallops or murmurs.  Abdomen: Non-distended appearing.  Msk: Normal strength and tone for age. Extremities: Warm and well perfused. No clubbing, cyanosis. No edema.  Neuro: Alert and oriented X 3. Psych: Answers questions appropriately.   Labs: Basic Metabolic Panel: Recent Labs    06/22/24 1553 06/23/24 0347  NA 133* 131*  K 5.4* 4.7  CL 99 97*  CO2 20* 23  GLUCOSE 335* 373*  BUN 63* 51*  CREATININE 6.07* 4.65*  CALCIUM  7.7* 7.5*  MG 2.5*  --   PHOS 7.1*  --    Liver Function Tests: Recent Labs    06/22/24 1553  AST 114*  ALT 102*  ALKPHOS 70  BILITOT 0.6  PROT 7.0  ALBUMIN  2.4*   No results for input(s): LIPASE, AMYLASE in the last 72 hours. CBC: Recent Labs    06/22/24 1553 06/23/24 0347  WBC 15.1* 19.2*  NEUTROABS 11.4*  --   HGB 9.7* 9.1*  HCT 31.7* 29.9*  MCV 96.4 95.5  PLT 214 210   Cardiac Enzymes: Recent Labs    06/22/24 1553 06/22/24 1823 06/23/24 0347  TROPONINIHS 87* 196* 260*   BNP: Recent Labs    06/22/24 1553  BNP 669.6*   D-Dimer: No results for input(s): DDIMER in the last 72 hours. Hemoglobin A1C: No results for input(s): HGBA1C in the last 72 hours. Fasting Lipid Panel: No results for input(s): CHOL, HDL, LDLCALC, TRIG, CHOLHDL, LDLDIRECT in the last 72 hours. Thyroid Function Tests: No results for input(s): TSH, T4TOTAL, T3FREE, THYROIDAB in the last 72 hours.  Invalid input(s): FREET3 Anemia Panel: No results for input(s): VITAMINB12, FOLATE, FERRITIN, TIBC, IRON , RETICCTPCT in the last 72 hours.   Radiology: CT CHEST WO CONTRAST Result Date: 06/23/2024 CLINICAL DATA:  Chronic dyspnea EXAM: CT CHEST WITHOUT CONTRAST TECHNIQUE: Multidetector CT imaging of the chest was performed following the standard protocol without IV contrast. RADIATION DOSE REDUCTION: This exam was performed according to the departmental dose-optimization  program which includes automated exposure control, adjustment of the mA and/or kV according to patient size and/or use of iterative reconstruction technique. COMPARISON:  June 22, 2024 and prior studies FINDINGS: Cardiovascular: Normal caliber thoracic aorta. Right IJ approach tunneled central venous catheter with distal tip at the cavoatrial junction. Coronary artery calcifications. Mediastinum/Nodes: No lymphadenopathy. Lungs/Pleura: Similar appearance of bilateral lower lobe consolidations. Small partially loculated left and trace right pleural effusions, similar to prior. Scattered areas with interlobular septal thickening and few areas of ground-glass may be related to mild interstitial pulmonary edema. Upper Abdomen: No acute findings. Musculoskeletal: Unchanged T5-T11 posterior spinal fixation hardware. Associated streak artifact limits evaluation of the posterior chest wall. IMPRESSION: 1. Unchanged chronic bilateral lower lobe consolidations, nonspecific and possibly representing chronic atelectasis. Small partially loculated left and trace right pleural effusions, similar to prior chest CT. 2.  Findings suggestive of mild interstitial pulmonary edema. Electronically Signed   By: Michaeline Blanch M.D.   On: 06/23/2024 11:03   CT Head Wo Contrast Result Date: 06/22/2024 CLINICAL DATA:  Altered mental status.  Hallucination. EXAM: CT HEAD WITHOUT CONTRAST TECHNIQUE: Contiguous axial images were obtained from the base of the skull through the vertex without intravenous contrast. RADIATION DOSE REDUCTION: This exam was performed according to the departmental dose-optimization program which includes automated exposure control, adjustment of the mA and/or kV according to patient size and/or use of iterative reconstruction technique. COMPARISON:  05/11/2024 FINDINGS: Brain: Ventricles, cisterns and other CSF spaces are normal. No mass, mass effect, shift of midline structures or  acute hemorrhage. No evidence  of acute infarction. Vascular: No hyperdense vessel or unexpected calcification. Skull: Normal. Negative for fracture or focal lesion. Sinuses/Orbits: Orbits are normal. Paranasal sinuses are well aerated and otherwise clear. Other: None. IMPRESSION: No acute findings. Electronically Signed   By: Toribio Agreste M.D.   On: 06/22/2024 17:32   DG Chest Portable 1 View Result Date: 06/22/2024 CLINICAL DATA:  Shortness of breath EXAM: PORTABLE CHEST 1 VIEW COMPARISON:  05/27/2024, CT 05/11/2024, 04/19/2024, 10/22/2019 FINDINGS: Limited by patient positioning. Right-sided central venous. Similar small left greater than right pleural effusions with probable loculation on the left. Persistent bibasilar airspace disease. No pneumothorax IMPRESSION: Similar small left greater than right pleural effusions with probable loculation on the left. Persistent bibasilar airspace disease. Cardiomegaly Electronically Signed   By: Luke Bun M.D.   On: 06/22/2024 16:24   DG Chest 2 View Result Date: 05/27/2024 CLINICAL DATA:  Hypoxia. EXAM: CHEST - 2 VIEW COMPARISON:  Radiograph 05/25/2024 FINDINGS: Right-sided dialysis catheter unchanged. Cardiomegaly is unchanged. Stable left pleural effusion and basilar opacity. Small right pleural effusion, unchanged. No new airspace disease. Thoracic spine hardware is again seen. IMPRESSION: 1. Stable left pleural effusion and basilar opacity. 2. Small right pleural effusion, unchanged. 3. Unchanged cardiomegaly. Electronically Signed   By: Andrea Gasman M.D.   On: 05/27/2024 19:19   NM Pulmonary Perfusion Result Date: 05/27/2024 CLINICAL DATA:  Hypoxemia EXAM: NUCLEAR MEDICINE PERFUSION LUNG SCAN TECHNIQUE: Perfusion images were obtained in multiple projections after intravenous injection of radiopharmaceutical. RADIOPHARMACEUTICALS:  4.5 mCi Tc-14m MAA COMPARISON:  Radiograph 05/27/2024, CT chest 05/11/2024 FINDINGS: No wedge-shaped peripheral perfusion defects to localize acute  pulmonary embolism. Small peripheral defects in the RIGHT lower are favored atelectasis. Generalize attenuation in the LEFT lower lobe related to effusions. IMPRESSION: No evidence acute pulmonary embolism. Electronically Signed   By: Jackquline Boxer M.D.   On: 05/27/2024 17:36   US  Venous Img Lower Bilateral (DVT) Result Date: 05/27/2024 CLINICAL DATA:  Bilateral lower extremity swelling EXAM: BILATERAL LOWER EXTREMITY VENOUS DOPPLER ULTRASOUND TECHNIQUE: Gray-scale sonography with graded compression, as well as color Doppler and duplex ultrasound were performed to evaluate the lower extremity deep venous systems from the level of the common femoral vein and including the common femoral, femoral, profunda femoral, popliteal and calf veins including the posterior tibial, peroneal and gastrocnemius veins when visible. The superficial great saphenous vein was also interrogated. Spectral Doppler was utilized to evaluate flow at rest and with distal augmentation maneuvers in the common femoral, femoral and popliteal veins. COMPARISON:  None Available. FINDINGS: RIGHT LOWER EXTREMITY Common Femoral Vein: No evidence of thrombus. Normal compressibility, respiratory phasicity and response to augmentation. Saphenofemoral Junction: No evidence of thrombus. Normal compressibility and flow on color Doppler imaging. Profunda Femoral Vein: No evidence of thrombus. Normal compressibility and flow on color Doppler imaging. Femoral Vein: No evidence of thrombus. Normal compressibility, respiratory phasicity and response to augmentation. Popliteal Vein: No evidence of thrombus. Normal compressibility, respiratory phasicity and response to augmentation. Calf Veins: Below the knee amputation. Superficial Great Saphenous Vein: No evidence of thrombus. Normal compressibility. Venous Reflux:  None. Other Findings:  None. LEFT LOWER EXTREMITY Common Femoral Vein: No evidence of thrombus. Normal compressibility, respiratory phasicity  and response to augmentation. Saphenofemoral Junction: No evidence of thrombus. Normal compressibility and flow on color Doppler imaging. Profunda Femoral Vein: No evidence of thrombus. Normal compressibility and flow on color Doppler imaging. Femoral Vein: No evidence of thrombus. Normal compressibility, respiratory phasicity and response to augmentation.  Popliteal Vein: No evidence of thrombus. Normal compressibility, respiratory phasicity and response to augmentation. Calf Veins: Below the knee amputation. Superficial Great Saphenous Vein: No evidence of thrombus. Normal compressibility. Venous Reflux:  None. Other Findings:  None. IMPRESSION: No evidence of deep venous thrombosis in either lower extremity. Electronically Signed   By: Wilkie Lent M.D.   On: 05/27/2024 08:55   DG Chest Port 1 View Result Date: 05/25/2024 CLINICAL DATA:  Shortness of breath. EXAM: PORTABLE CHEST 1 VIEW COMPARISON:  Radiograph 05/21/2024, most recent CT 05/11/24 FINDINGS: Right-sided dialysis catheter tip overlies the SVC. Bilateral pleural effusions, left greater than right with bibasilar opacities, appearing rounded on the left. Cardiomegaly is stable. No pulmonary edema or pneumothorax. Postsurgical change in the thoracolumbar spine. IMPRESSION: 1. New right-sided dialysis catheter. Otherwise stable radiographic appearance of the chest. Unchanged bilateral pleural effusions, left greater than right with bibasilar opacities. 2. Stable cardiomegaly. Electronically Signed   By: Andrea Gasman M.D.   On: 05/25/2024 18:35    ECHO 03/2024:  1. Left ventricular ejection fraction, by estimation, is 55 to 60%. The left ventricle has normal function. The left ventricle has no regional wall motion abnormalities. Left ventricular diastolic function could not be evaluated.   2. Right ventricular systolic function is normal. The right ventricular  size is not well visualized.   3. The mitral valve is normal in structure. No  evidence of mitral valve  regurgitation.   4. The aortic valve is normal in structure. Aortic valve regurgitation is  not visualized.   TELEMETRY reviewed by me 06/23/2024: sinus rhythm PACs rate 70s  EKG reviewed by me: NSR 1st degree AVB rate 86 bpm  Data reviewed by me 06/23/2024: last 24h vitals tele labs imaging I/O ED provider note, admission H&P  Principal Problem:   Severe sepsis with acute organ dysfunction (HCC) Active Problems:   Type 2 diabetes mellitus with hyperglycemia, with long-term current use of insulin  (HCC)   HTN (hypertension)   Dyslipidemia   Chronic pain syndrome   GERD (gastroesophageal reflux disease)   Tobacco use disorder   Diabetic gastroparesis (HCC)   Hypothyroidism   Anasarca associated with disorder of kidney   Obesity, Class III, BMI 40-49.9 (morbid obesity)   Elevated troponin    ASSESSMENT AND PLAN:  Eugene [name] is a 63 y.o. male  with a past medical history of morbid obesity, end-stage renal disease on hemodialysis, insulin -dependent diabetes mellitus 2, hypothyroid, hypertension who presented to the ED on 06/22/2024 for SOB and altered mental status. Troponins found to be elevated in the setting of sepsis. Cardiology was consulted for further evaluation.   # Sepsis # Demand ischemia # ESRD on HD # Hypertension Patient presented with SOB and AMS, found to have respiratory decompensation and placed on BiPAP. Dialysis yesterday evening after presentation with improvement in mental status and respiratory status. Had echo in July with preserved EF, no WMAs.  -Continue with regular dialysis scheduled. -Continue diltiazem  240 mg daily and carvedilol  6.25 mg twice daily. -Continue heparin  gtt, will DC tomorrow. -Mildly elevated troponin most consistent with demand/supply mismatch and not ACS in the setting of sepsis, acute hypoxic respiratory failure. No plan for further cardiac diagnostics at this time.  -Further management of sepsis per primary  team.   This patient's plan of care was discussed and created with Dr. Florencio and he is in agreement.  Signed: Danita Bloch, PA-C  06/23/2024, 12:50 PM [facility] Card[facility]

## 2024-06-24 ENCOUNTER — Inpatient Hospital Stay

## 2024-06-24 DIAGNOSIS — R6520 Severe sepsis without septic shock: Secondary | ICD-10-CM | POA: Diagnosis not present

## 2024-06-24 DIAGNOSIS — A419 Sepsis, unspecified organism: Secondary | ICD-10-CM | POA: Diagnosis not present

## 2024-06-24 LAB — BASIC METABOLIC PANEL WITH GFR
Anion gap: 10 (ref 5–15)
BUN: 65 mg/dL — ABNORMAL HIGH (ref 8–23)
CO2: 25 mmol/L (ref 22–32)
Calcium: 7.4 mg/dL — ABNORMAL LOW (ref 8.9–10.3)
Chloride: 99 mmol/L (ref 98–111)
Creatinine, Ser: 5.12 mg/dL — ABNORMAL HIGH (ref 0.61–1.24)
GFR, Estimated: 12 mL/min — ABNORMAL LOW (ref >60)
Glucose, Bld: 366 mg/dL — ABNORMAL HIGH (ref 70–99)
Potassium: 5.1 mmol/L (ref 3.5–5.1)
Sodium: 134 mmol/L — ABNORMAL LOW (ref 135–145)

## 2024-06-24 LAB — CBC
HCT: 29.7 % — ABNORMAL LOW (ref 39.0–52.0)
HCT: 27.7 % — ABNORMAL LOW (ref 39.0–52.0)
Hemoglobin: 9 g/dL — ABNORMAL LOW (ref 13.0–17.0)
Hemoglobin: 8.7 g/dL — ABNORMAL LOW (ref 13.0–17.0)
MCH: 29.1 pg (ref 26.0–34.0)
MCH: 30.2 pg (ref 26.0–34.0)
MCHC: 30.3 g/dL (ref 30.0–36.0)
MCHC: 31.4 g/dL (ref 30.0–36.0)
MCV: 96.1 fL (ref 80.0–100.0)
MCV: 96.2 fL (ref 80.0–100.0)
Platelets: 201 K/uL (ref 150–400)
Platelets: 205 K/uL (ref 150–400)
RBC: 3.09 MIL/uL — ABNORMAL LOW (ref 4.22–5.81)
RBC: 2.88 MIL/uL — ABNORMAL LOW (ref 4.22–5.81)
RDW: 14.5 % (ref 11.5–15.5)
RDW: 14.5 % (ref 11.5–15.5)
WBC: 9.6 K/uL (ref 4.0–10.5)
WBC: 12.8 K/uL — ABNORMAL HIGH (ref 4.0–10.5)
nRBC: 0 % (ref 0.0–0.2)
nRBC: 0 % (ref 0.0–0.2)

## 2024-06-24 LAB — HEPARIN LEVEL (UNFRACTIONATED): Heparin Unfractionated: <0.1 [IU]/mL — ABNORMAL LOW (ref 0.30–0.70)

## 2024-06-24 LAB — LACTATE DEHYDROGENASE: LDH: 176 U/L (ref 98–192)

## 2024-06-24 LAB — BODY FLUID CELL COUNT WITH DIFFERENTIAL
Eos, Fluid: 0 %
Lymphs, Fluid: 87 %
Monocyte-Macrophage-Serous Fluid: 4 %
Neutrophil Count, Fluid: 9 %
Total Nucleated Cell Count, Fluid: 61 uL

## 2024-06-24 LAB — HEMOGLOBIN A1C
Hgb A1c MFr Bld: 7.3 % — ABNORMAL HIGH (ref 4.8–5.6)
Mean Plasma Glucose: 162.81 mg/dL

## 2024-06-24 LAB — GLUCOSE, CAPILLARY
Glucose-Capillary: 355 mg/dL — ABNORMAL HIGH (ref 70–99)
Glucose-Capillary: 338 mg/dL — ABNORMAL HIGH (ref 70–99)
Glucose-Capillary: 158 mg/dL — ABNORMAL HIGH (ref 70–99)
Glucose-Capillary: 145 mg/dL — ABNORMAL HIGH (ref 70–99)
Glucose-Capillary: 240 mg/dL — ABNORMAL HIGH (ref 70–99)

## 2024-06-24 LAB — GLUCOSE, PLEURAL OR PERITONEAL FLUID: Glucose, Fluid: 241 mg/dL

## 2024-06-24 LAB — HEPATITIS B SURFACE ANTIBODY, QUANTITATIVE: Hep B S AB Quant (Post): <3.5 m[IU]/mL — ABNORMAL LOW

## 2024-06-24 LAB — PROTEIN, PLEURAL OR PERITONEAL FLUID: Total protein, fluid: <3 g/dL

## 2024-06-24 LAB — LACTATE DEHYDROGENASE, PLEURAL OR PERITONEAL FLUID: LD, Fluid: 57 U/L — ABNORMAL HIGH (ref 3–23)

## 2024-06-24 MED ORDER — LIDOCAINE-PRILOCAINE 2.5-2.5 % EX CREA
1.0000 | TOPICAL_CREAM | CUTANEOUS | Status: DC | PRN
Start: 2024-06-24 — End: 2024-06-24

## 2024-06-24 MED ORDER — ALTEPLASE 2 MG IJ SOLR
2.0000 mg | Freq: Once | INTRAMUSCULAR | Status: DC | PRN
Start: 2024-06-24 — End: 2024-06-24

## 2024-06-24 MED ORDER — OXYCODONE HCL 5 MG PO TABS
5.0000 mg | ORAL_TABLET | Freq: Four times a day (QID) | ORAL | Status: DC | PRN
  Administered 2024-06-28: 5 mg via ORAL
  Filled 2024-06-24: qty 1

## 2024-06-24 MED ORDER — HEPARIN SODIUM (PORCINE) 1000 UNIT/ML IJ SOLN
INTRAMUSCULAR | Status: AC
Start: 2024-06-24 — End: 2024-06-24
  Filled 2024-06-24: qty 5

## 2024-06-24 MED ORDER — ANTICOAGULANT SODIUM CITRATE 4% (200MG/5ML) IV SOLN
5.0000 mL | Status: DC | PRN
Start: 2024-06-24 — End: 2024-06-24

## 2024-06-24 MED ORDER — HEPARIN SODIUM (PORCINE) 1000 UNIT/ML DIALYSIS: 1000.0000 [IU] | INTRAMUSCULAR | Status: DC | PRN

## 2024-06-24 MED ORDER — LINACLOTIDE 145 MCG PO CAPS
145.0000 ug | ORAL_CAPSULE | Freq: Every day | ORAL | Status: DC
  Administered 2024-06-25 – 2024-06-30 (×5): 145 ug via ORAL
  Filled 2024-06-24 (×6): qty 1

## 2024-06-24 MED ORDER — ALTEPLASE 2 MG IJ SOLR: 2.0000 mg | Freq: Once | INTRAMUSCULAR | Status: DC | PRN

## 2024-06-24 MED ORDER — EPOETIN ALFA 4000 UNIT/ML IJ SOLN
INTRAMUSCULAR | Status: AC
  Filled 2024-06-24: qty 1

## 2024-06-24 MED ORDER — INSULIN GLARGINE 100 UNIT/ML ~~LOC~~ SOLN
12.0000 [IU] | Freq: Two times a day (BID) | SUBCUTANEOUS | Status: DC
  Administered 2024-06-24 – 2024-06-27 (×5): 12 [IU] via SUBCUTANEOUS
  Filled 2024-06-24 (×7): qty 0.12

## 2024-06-24 MED ORDER — LIDOCAINE HCL (PF) 1 % IJ SOLN
10.0000 mL | Freq: Once | INTRAMUSCULAR | Status: AC
Start: 2024-06-24 — End: 2024-06-24
  Administered 2024-06-24: 10 mL via INTRADERMAL

## 2024-06-24 MED ORDER — INSULIN GLARGINE 100 UNIT/ML ~~LOC~~ SOLN
14.0000 [IU] | Freq: Two times a day (BID) | SUBCUTANEOUS | Status: DC
  Filled 2024-06-24: qty 0.14

## 2024-06-24 MED ORDER — EPOETIN ALFA-EPBX 4000 UNIT/ML IJ SOLN
4000.0000 [IU] | INTRAMUSCULAR | Status: DC
  Administered 2024-06-24 – 2024-06-29 (×3): 4000 [IU] via INTRAVENOUS
  Filled 2024-06-24 (×2): qty 1

## 2024-06-24 MED ORDER — HEPARIN BOLUS VIA INFUSION
3100.0000 [IU] | Freq: Once | INTRAVENOUS | Status: AC
  Administered 2024-06-24: 3100 [IU] via INTRAVENOUS
  Filled 2024-06-24: qty 3100

## 2024-06-24 MED ORDER — HEPARIN (PORCINE) 25000 UT/250ML-% IV SOLN
2200.0000 [IU]/h | INTRAVENOUS | Status: AC
  Administered 2024-06-24 (×2): 2200 [IU]/h via INTRAVENOUS
  Filled 2024-06-24: qty 250

## 2024-06-24 MED ORDER — OXYCODONE HCL 5 MG PO TABS
ORAL_TABLET | ORAL | Status: AC
  Filled 2024-06-24: qty 2

## 2024-06-24 MED ORDER — PENTAFLUOROPROP-TETRAFLUOROETH EX AERO
1.0000 | INHALATION_SPRAY | CUTANEOUS | Status: DC | PRN
Start: 2024-06-24 — End: 2024-06-24

## 2024-06-24 MED ORDER — LIDOCAINE HCL (PF) 1 % IJ SOLN: 5.0000 mL | INTRAMUSCULAR | Status: DC | PRN

## 2024-06-24 MED ORDER — OXYCODONE HCL 5 MG PO TABS
10.0000 mg | ORAL_TABLET | Freq: Four times a day (QID) | ORAL | Status: DC | PRN
  Administered 2024-06-24 – 2024-06-30 (×12): 10 mg via ORAL
  Filled 2024-06-24 (×12): qty 2

## 2024-06-24 MED ORDER — FAMOTIDINE 20 MG PO TABS
10.0000 mg | ORAL_TABLET | Freq: Every day | ORAL | Status: DC
  Administered 2024-06-25 – 2024-06-30 (×6): 10 mg via ORAL
  Filled 2024-06-24 (×6): qty 1

## 2024-06-24 NOTE — Progress Notes (Signed)
 Progress Note   Patient: Eugene [name] FMW:[id number] DOB: 09-01-1961 DOA: 06/22/2024     2 DOS: the patient was seen and examined on 06/24/2024   Brief hospital course: Mr. Bradd Merlos is a 63 year old male with history of morbid obesity, end-stage renal disease on hemodialysis, insulin -dependent diabetes mellitus 2, hypothyroid, hypertension admitted for sepsis due to pneumonia. Hospital course as below  Assessment and Plan: Severe sepsis with acute organ dysfunction (HCC) Secondary to pneumonia Acute on chronic hypoxic respiratory failure - Presented with WBC 19.2, tachycardia - Patient required BiPAP on arrival but have been weaned off to high flow - CT scan of the chest showing findings consolidation in bilateral lower lobes, small partially loculated effusion on the left - s/p left sided Thoracentesis by IR removed , follow up pleural fluid analysis - Continue IV Ceftriaxone , Azithromycin  - Follow up blood cultures   Elevated troponin likely secondary to demand ischemia in the setting of sepsis and hypoxic respiratory failure - Patient noted to have uptrending troponin, Denies chest pain - Complete 48 hours of heparin  drip until 8pm - Seen by Cardiology, appreciate recs - Patient had complete echo on 04/19/2024, with estimated ejection fraction of 55 to 60%   HTN (hypertension) Continue Cardizem  240 mg, carvedilol  6.25 mg bid   Type 2 diabetes mellitus with hyperglycemia, with long-term current use of insulin  (HCC) HbA1c pending Home long-acting insulin , 12 units twice daily resumed Insulin  SSI with at bedtime coverage   ESRD on hemodialysis - Nephrology is consulted for dialysis needs  Normocytic anemia - EPO per Nephrology  Chronic diastolic CHF - Echo in July showed EF 55 to 60%. - Resume Lasix  as per nephro, on dialysis  S/p bilateral BKA - No acute concerns  Suprapubic catheter - denies any symptoms   Obesity, Class III, BMI 40-49.9 (morbid  obesity) This complicates overall care and prognosis.    Hypothyroidism Levothyroxine  175 mcg daily resumed   Tobacco use disorder As needed nicotine  patch ordered   Chronic pain syndrome Continue home oxycodone    DVT prophylaxis: Heparin  5000 units subcutaneous every 8 hours Code Status: DNR/DNI   Subjective:  Patient seen and examined at bedside Denies chest pain, respiratory distress, was post dialysis and was feeling better Underwent thoracentesis by IR  Physical Exam: Respiratory: clear to auscultation bilaterally, no wheezing, no crackles. Normal respiratory effort. No accessory muscle use.  Cardiovascular: Regular rate and rhythm, no murmurs / rubs / gallops.  Generalized extremity edema. 2+ pedal pulses. No carotid bruits.  Abdomen: Morbidly obese abdomen, no tenderness, no masses palpated, no hepatosplenomegaly. Bowel sounds positive.  Musculoskeletal: no clubbing / cyanosis. No joint deformity upper and lower extremities. Good ROM, no contractures, no atrophy. Normal muscle tone.  Skin: no rashes, lesions, ulcers. No induration Neurologic: Sensation intact. Strength 5/5 in all 4.  Psychiatric: Normal judgment and insight.     Vitals:   06/24/24 0854 06/24/24 0919 06/24/24 0930 06/24/24 1000  BP: (!) 149/64 137/84 (!) 153/65 (!) 148/74  Pulse: 100 60 64 65  Resp: 14 (!) 9 13 18   Temp: 98.4 F (36.9 C)     TempSrc: Oral     SpO2: 91% 97% 97% 94%  Weight:      Height:        Data Reviewed:  I have reviewed patient's CT scan as shown above I have also reviewed below mentioned labs    Latest Ref Rng & Units 06/24/2024    5:05 AM 06/23/2024    3:47 AM  06/22/2024    3:53 PM  BMP  Glucose 70 - 99 mg/dL 633  626  664   BUN 8 - 23 mg/dL 65  51  63   Creatinine 0.61 - 1.24 mg/dL 4.87  5.34  3.92   Sodium 135 - 145 mmol/L 134  131  133   Potassium 3.5 - 5.1 mmol/L 5.1  4.7  5.4   Chloride 98 - 111 mmol/L 99  97  99   CO2 22 - 32 mmol/L 25  23  20    Calcium  8.9  - 10.3 mg/dL 7.4  7.5  7.7        Latest Ref Rng & Units 06/24/2024   10:10 AM 06/24/2024    5:05 AM 06/23/2024    3:47 AM  CBC  WBC 4.0 - 10.5 K/uL 12.8  9.6  19.2   Hemoglobin 13.0 - 17.0 g/dL 8.7  9.0  9.1   Hematocrit 39.0 - 52.0 % 27.7  29.7  29.9   Platelets 150 - 400 K/uL 205  201  210     Family Communication: No family present at bedside  Disposition: Pending clinical course  Author: Laree Lock, MD 06/24/2024 10:37 AM  For on call review [email or web address].

## 2024-06-24 NOTE — Progress Notes (Signed)
 [facility]  ROUNDING NOTE   Subjective:   Eugene [name] is a 63 y.o. male with past medical conditions including diabetes, bilateral BKA, suprapubic catheter, hepatitis C, and end-stage renal disease on hemodialysis.  Patient presented to the emergency department from nursing facility with altered mental status and respiratory distress.  Patient currently admitted to ICU for Anasarca [R60.1] Severe sepsis with acute organ dysfunction (HCC) [A41.9, R65.20] Acute on chronic respiratory failure with hypoxia (HCC) [J96.21] Altered mental status, unspecified altered mental status type [R41.82] Pneumonia due to infectious organism, unspecified laterality, unspecified part of lung [J18.9]  Patient is known to our practice and receives outpatient dialysis treatments at [facility] on a MWF schedule, supervised by Dr. Dennise.   Update: Patient seen and evaluated during dialysis   HEMODIALYSIS FLOWSHEET:  Blood Flow Rate (mL/min): 399 mL/min Arterial Pressure (mmHg): -259.99 mmHg Venous Pressure (mmHg): 300.59 mmHg TMP (mmHg): 6.26 mmHg Ultrafiltration Rate (mL/min): 1012 mL/min Dialysate Flow Rate (mL/min): 300 ml/min  Resting comfortably during treatment Complains of mild generalized pain   Objective:  Vital signs in last 24 hours:  Temp:  [97.4 F (36.3 C)-98.4 F (36.9 C)] 98.4 F (36.9 C) (10/01 0854) Pulse Rate:  [30-100] 65 (10/01 1000) Resp:  [8-25] 18 (10/01 1000) BP: (114-153)/(54-84) 148/74 (10/01 1000) SpO2:  [90 %-97 %] 94 % (10/01 1000)  Weight change:  Filed Weights   06/22/24 1537  Weight: 136.1 kg    Intake/Output: I/O last 3 completed shifts: In: 633.2 [I.V.:533.6; IV Piggyback:99.6] Out: 2705 [Urine:1205; Other:1500]   Intake/Output this shift:  Total I/O In: 49.1 [I.V.:49.1] Out: -   Physical Exam: General: NAD  Head: Normocephalic, atraumatic. Moist oral mucosal membranes  Eyes: Anicteric  Lungs:  Diminished, HFNC  Heart:  Regular rate and rhythm  Abdomen:  Soft, nontender, obese  Extremities: 1+ peripheral edema.  Bilateral AKA  Neurologic: Awake, alert, conversant  Skin: Warm,dry, no rash  Access: Right IJ PermCath    Basic Metabolic Panel: Recent Labs  Lab 06/22/24 1553 06/23/24 0347 06/24/24 0505  NA 133* 131* 134*  K 5.4* 4.7 5.1  CL 99 97* 99  CO2 20* 23 25  GLUCOSE 335* 373* 366*  BUN 63* 51* 65*  CREATININE 6.07* 4.65* 5.12*  CALCIUM  7.7* 7.5* 7.4*  MG 2.5*  --   --   PHOS 7.1*  --   --     Liver Function Tests: Recent Labs  Lab 06/22/24 1553  AST 114*  ALT 102*  ALKPHOS 70  BILITOT 0.6  PROT 7.0  ALBUMIN  2.4*   No results for input(s): LIPASE, AMYLASE in the last 168 hours. No results for input(s): AMMONIA in the last 168 hours.  CBC: Recent Labs  Lab 06/22/24 1553 06/23/24 0347 06/24/24 0505  WBC 15.1* 19.2* 9.6  NEUTROABS 11.4*  --   --   HGB 9.7* 9.1* 9.0*  HCT 31.7* 29.9* 29.7*  MCV 96.4 95.5 96.1  PLT 214 210 201    Cardiac Enzymes: No results for input(s): CKTOTAL, CKMB, CKMBINDEX, TROPONINI in the last 168 hours.  BNP: Invalid input(s): POCBNP  CBG: Recent Labs  Lab 06/23/24 1122 06/23/24 1626 06/23/24 2215 06/24/24 0315 06/24/24 0735  GLUCAP 363* 358* 359* 355* 338*    Microbiology: Results for orders placed or performed during the hospital encounter of 06/22/24  Culture, blood (routine x 2)     Status: None (Preliminary result)   Collection Time: 06/22/24  3:53 PM   Specimen: BLOOD  Result Value Ref  Range Status   Specimen Description BLOOD BLOOD LEFT ARM  Final   Special Requests   Final    BOTTLES DRAWN AEROBIC AND ANAEROBIC Blood Culture adequate volume   Culture   Final    NO GROWTH 2 DAYS Performed at [facility], [address] [place], [place] [address]    Report Status PENDING  Incomplete  Resp panel by RT-PCR (RSV, Flu A&B, Covid) Anterior Nasal Swab     Status: None   Collection Time: 06/22/24   3:53 PM   Specimen: Anterior Nasal Swab  Result Value Ref Range Status   SARS Coronavirus 2 by RT PCR NEGATIVE NEGATIVE Final    Comment: (NOTE) SARS-CoV-2 target nucleic acids are NOT DETECTED.  The SARS-CoV-2 RNA is generally detectable in upper respiratory specimens during the acute phase of infection. The lowest concentration of SARS-CoV-2 viral copies this assay can detect is 138 copies/mL. A negative result does not preclude SARS-Cov-2 infection and should not be used as the sole basis for treatment or other patient management decisions. A negative result may occur with  improper specimen collection/handling, submission of specimen other than nasopharyngeal swab, presence of viral mutation(s) within the areas targeted by this assay, and inadequate number of viral copies(<138 copies/mL). A negative result must be combined with clinical observations, patient history, and epidemiological information. The expected result is Negative.  Fact Sheet for Patients:  BloggerCourse.com  Fact Sheet for Healthcare Providers:  SeriousBroker.it  This test is no t yet approved or cleared by the [place]  FDA and  has been authorized for detection and/or diagnosis of SARS-CoV-2 by FDA under an Emergency Use Authorization (EUA). This EUA will remain  in effect (meaning this test can be used) for the duration of the COVID-19 declaration under Section 564(b)(1) of the Act, 21 U.S.C.section 360bbb-3(b)(1), unless the authorization is terminated  or revoked sooner.       Influenza A by PCR NEGATIVE NEGATIVE Final   Influenza B by PCR NEGATIVE NEGATIVE Final    Comment: (NOTE) The Xpert Xpress SARS-CoV-2/FLU/RSV plus assay is intended as an aid in the diagnosis of influenza from Nasopharyngeal swab specimens and should not be used as a sole basis for treatment. Nasal washings and aspirates are unacceptable for Xpert Xpress  SARS-CoV-2/FLU/RSV testing.  Fact Sheet for Patients: BloggerCourse.com  Fact Sheet for Healthcare Providers: SeriousBroker.it  This test is not yet approved or cleared by the [place]  FDA and has been authorized for detection and/or diagnosis of SARS-CoV-2 by FDA under an Emergency Use Authorization (EUA). This EUA will remain in effect (meaning this test can be used) for the duration of the COVID-19 declaration under Section 564(b)(1) of the Act, 21 U.S.C. section 360bbb-3(b)(1), unless the authorization is terminated or revoked.     Resp Syncytial Virus by PCR NEGATIVE NEGATIVE Final    Comment: (NOTE) Fact Sheet for Patients: BloggerCourse.com  Fact Sheet for Healthcare Providers: SeriousBroker.it  This test is not yet approved or cleared by the [place]  FDA and has been authorized for detection and/or diagnosis of SARS-CoV-2 by FDA under an Emergency Use Authorization (EUA). This EUA will remain in effect (meaning this test can be used) for the duration of the COVID-19 declaration under Section 564(b)(1) of the Act, 21 U.S.C. section 360bbb-3(b)(1), unless the authorization is terminated or revoked.  Performed at [facility], [address], [place], [place] [address]   Urine Culture     Status: Abnormal (Preliminary result)   Collection Time: 06/22/24  4:00 PM   Specimen: Urine, Suprapubic  Result Value Ref Range Status   Specimen Description   Final    URINE, SUPRAPUBIC Performed at [facility], [address] [place], [place] [address]    Special Requests   Final    NONE Performed at [facility], [address] [place], [place] [address]    Culture (A)  Final    >=100,000 COLONIES/mL ESCHERICHIA COLI >=100,000 COLONIES/mL PROVIDENCIA STUARTII REPEATING SUSCEPTIBILITY Performed at [facility], [address] [place], [place] [address]    Report Status PENDING  Incomplete   Organism ID, Bacteria PROVIDENCIA STUARTII (A)  Final      Susceptibility   Providencia stuartii - MIC*    AMPICILLIN RESISTANT Resistant     CEFEPIME  <=0.12 SENSITIVE Sensitive     ERTAPENEM <=0.12 SENSITIVE Sensitive     CEFTRIAXONE  <=0.25 SENSITIVE Sensitive     CIPROFLOXACIN  >=4 RESISTANT Resistant     GENTAMICIN RESISTANT Resistant     NITROFURANTOIN 128 RESISTANT Resistant     TRIMETH /SULFA  <=20 SENSITIVE Sensitive     AMPICILLIN/SULBACTAM 16 INTERMEDIATE Intermediate     PIP/TAZO Value in next row Sensitive      <=4 SENSITIVEThis is a modified FDA-approved test that has been validated and its performance characteristics determined by the reporting laboratory.  This laboratory is certified under the Clinical Laboratory Improvement Amendments CLIA as qualified to perform high complexity clinical laboratory testing.    MEROPENEM Value in next row Sensitive      <=4 SENSITIVEThis is a modified FDA-approved test that has been validated and its performance characteristics determined by the reporting laboratory.  This laboratory is certified under the Clinical Laboratory Improvement Amendments CLIA as qualified to perform high complexity clinical laboratory testing.    * >=100,000 COLONIES/mL PROVIDENCIA STUARTII  Culture, blood (routine x 2)     Status: None (Preliminary result)   Collection Time: 06/23/24 11:23 AM   Specimen: BLOOD  Result Value Ref Range Status   Specimen Description BLOOD BLOOD RIGHT HAND  Final   Special Requests   Final    BOTTLES DRAWN AEROBIC AND ANAEROBIC Blood Culture adequate volume   Culture   Final    NO GROWTH < 24 HOURS Performed at [facility], [address], [place], [place] [address]    Report Status PENDING  Incomplete    Coagulation Studies: Recent Labs    06/22/24 1944  LABPROT 14.5  INR 1.1    Urinalysis: Recent Labs    06/22/24 1600  COLORURINE YELLOW*   LABSPEC 1.027  PHURINE 6.0  GLUCOSEU 50*  HGBUR NEGATIVE  BILIRUBINUR NEGATIVE  KETONESUR NEGATIVE  PROTEINUR 100*  NITRITE NEGATIVE  LEUKOCYTESUR MODERATE*      Imaging: CT CHEST WO CONTRAST Result Date: 06/23/2024 CLINICAL DATA:  Chronic dyspnea EXAM: CT CHEST WITHOUT CONTRAST TECHNIQUE: Multidetector CT imaging of the chest was performed following the standard protocol without IV contrast. RADIATION DOSE REDUCTION: This exam was performed according to the departmental dose-optimization program which includes automated exposure control, adjustment of the mA and/or kV according to patient size and/or use of iterative reconstruction technique. COMPARISON:  June 22, 2024 and prior studies FINDINGS: Cardiovascular: Normal caliber thoracic aorta. Right IJ approach tunneled central venous catheter with distal tip at the cavoatrial junction. Coronary artery calcifications. Mediastinum/Nodes: No lymphadenopathy. Lungs/Pleura: Similar appearance of bilateral lower lobe consolidations. Small partially loculated left and trace right pleural effusions, similar to prior. Scattered areas with interlobular septal thickening  and few areas of ground-glass may be related to mild interstitial pulmonary edema. Upper Abdomen: No acute findings. Musculoskeletal: Unchanged T5-T11 posterior spinal fixation hardware. Associated streak artifact limits evaluation of the posterior chest wall. IMPRESSION: 1. Unchanged chronic bilateral lower lobe consolidations, nonspecific and possibly representing chronic atelectasis. Small partially loculated left and trace right pleural effusions, similar to prior chest CT. 2.  Findings suggestive of mild interstitial pulmonary edema. Electronically Signed   By: Michaeline Blanch M.D.   On: 06/23/2024 11:03   CT Head Wo Contrast Result Date: 06/22/2024 CLINICAL DATA:  Altered mental status.  Hallucination. EXAM: CT HEAD WITHOUT CONTRAST TECHNIQUE: Contiguous axial images were obtained  from the base of the skull through the vertex without intravenous contrast. RADIATION DOSE REDUCTION: This exam was performed according to the departmental dose-optimization program which includes automated exposure control, adjustment of the mA and/or kV according to patient size and/or use of iterative reconstruction technique. COMPARISON:  05/11/2024 FINDINGS: Brain: Ventricles, cisterns and other CSF spaces are normal. No mass, mass effect, shift of midline structures or acute hemorrhage. No evidence of acute infarction. Vascular: No hyperdense vessel or unexpected calcification. Skull: Normal. Negative for fracture or focal lesion. Sinuses/Orbits: Orbits are normal. Paranasal sinuses are well aerated and otherwise clear. Other: None. IMPRESSION: No acute findings. Electronically Signed   By: Toribio Agreste M.D.   On: 06/22/2024 17:32   DG Chest Portable 1 View Result Date: 06/22/2024 CLINICAL DATA:  Shortness of breath EXAM: PORTABLE CHEST 1 VIEW COMPARISON:  05/27/2024, CT 05/11/2024, 04/19/2024, 10/22/2019 FINDINGS: Limited by patient positioning. Right-sided central venous. Similar small left greater than right pleural effusions with probable loculation on the left. Persistent bibasilar airspace disease. No pneumothorax IMPRESSION: Similar small left greater than right pleural effusions with probable loculation on the left. Persistent bibasilar airspace disease. Cardiomegaly Electronically Signed   By: Luke Bun M.D.   On: 06/22/2024 16:24     Medications:    anticoagulant sodium citrate     azithromycin  500 mg (06/23/24 1213)   cefTRIAXone  (ROCEPHIN )  IV Stopped (06/23/24 1245)   heparin  2,200 Units/hr (06/24/24 0800)    carvedilol   6.25 mg Oral BID WC   Chlorhexidine  Gluconate Cloth  6 each Topical Q0600   diltiazem   240 mg Oral QPM   insulin  aspart  0-20 Units Subcutaneous TID AC & HS   insulin  glargine  12 Units Subcutaneous BID   levothyroxine   175 mcg Oral Q0600   melatonin  10  mg Oral QHS   acetaminophen  **OR** acetaminophen , alteplase , alteplase , alteplase , alteplase , anticoagulant sodium citrate, heparin , heparin , heparin , heparin , hydrALAZINE , lidocaine  (PF), lidocaine -prilocaine, nicotine , ondansetron  **OR** ondansetron  (ZOFRAN ) IV, oxyCODONE , pentafluoroprop-tetrafluoroeth, senna-docusate  Assessment/ Plan:  [name] is a 63 y.o.  male with past medical conditions including diabetes, bilateral BKA, suprapubic catheter, hepatitis C, and end-stage renal disease on hemodialysis.  Patient presented to the emergency department from nursing facility with altered mental status and respiratory distress.  Patient currently admitted to ICU for Anasarca [R60.1] Severe sepsis with acute organ dysfunction (HCC) [A41.9, R65.20] Acute on chronic respiratory failure with hypoxia (HCC) [J96.21] Altered mental status, unspecified altered mental status type [R41.82] Pneumonia due to infectious organism, unspecified laterality, unspecified part of lung [J18.9]  [facility]/MWF/right PermCath  End-stage renal disease with hyperkalemia on hemodialysis.  Last outpatient treatment received on Monday.  Potassium 5.1. Receiving treatment today, UF goal 2L as tolerated. Next treatment scheduled for Friday.   2.  Acute respiratory failure, patient placed on  BiPAP on ED arrival.  Weaned to 6 L high flow nasal cannula. Attempting fluid removal with dialysis today,   3. Anemia of chronic kidney disease Lab Results  Component Value Date   HGB 9.0 (L) 06/24/2024    Hemoglobin borderline, will order low dose Retacrit  4000 unit with dialysis  4. Diabetes mellitus type II with chronic kidney disease/renal manifestations: insulin  dependent. Home regimen includes glargine and lispro. Most recent hemoglobin A1c is 6.0 on 04/14/2024.   Glucose elevated. Primary team to continue management.     LOS: 2 Eugene [name] 10/1/202510:19 AM

## 2024-06-24 NOTE — Progress Notes (Signed)
 [facility] CLINIC CARDIOLOGY PROGRESS NOTE       Patient ID: Eugene [name] MRN: [id number] DOB/AGE: Jun 13, 1961 63 y.o.  Admit date: 06/22/2024 Referring Physician Dr. Drue Potter Primary Physician Care, [facility]  Primary Cardiologist None Reason for Consultation elevated troponin  HPI: Eugene [name] is a 63 y.o. male  with a past medical history of morbid obesity, end-stage renal disease on hemodialysis, insulin -dependent diabetes mellitus 2, hypothyroid, hypertension who presented to the ED on 06/22/2024 for SOB and altered mental status. Troponins found to be elevated in the setting of sepsis. Cardiology was consulted for further evaluation.   Interval history: - Patient seen and examined this morning, resting comfortably in hospital bed. - States that he did not sleep well and that his back hurts.  He denies any chest pain.  States that his shortness of breath feels stable. - He is scheduled for thoracentesis later today.  Review of systems complete and found to be negative unless listed above    Past Medical History:  Diagnosis Date   Anxiety    Depression    Diabetes mellitus without complication (HCC)    Hepatitis C    Hyperlipidemia    IBS (irritable bowel syndrome)    Osteomyelitis (HCC)    Spinal stenosis     Past Surgical History:  Procedure Laterality Date   BACK SURGERY     bilateral amputation Bilateral    CENTRAL LINE INSERTION-TUNNELED N/A 02/02/2019   Procedure: CENTRAL LINE INSERTION-TUNNELED;  Surgeon: Marea Selinda RAMAN, MD;  Location: [facility] INVASIVE CV LAB;  Service: Cardiovascular;  Laterality: N/A;   COLONOSCOPY WITH PROPOFOL  N/A 01/12/2020   Procedure: COLONOSCOPY WITH PROPOFOL ;  Surgeon: Jinny Carmine, MD;  Location: [facility] ENDOSCOPY;  Service: Endoscopy;  Laterality: N/A;   DIALYSIS/PERMA CATHETER INSERTION N/A 05/22/2024   Procedure: DIALYSIS/PERMA CATHETER INSERTION;  Surgeon: Jama Cordella MATSU, MD;  Location: [facility] INVASIVE CV LAB;  Service:  Cardiovascular;  Laterality: N/A;   ESOPHAGOGASTRODUODENOSCOPY (EGD) WITH PROPOFOL  N/A 12/07/2019   Procedure: ESOPHAGOGASTRODUODENOSCOPY (EGD) WITH PROPOFOL ;  Surgeon: Unk Corinn Skiff, MD;  Location: [facility] ENDOSCOPY;  Service: Gastroenterology;  Laterality: N/A;   IR CATHETER TUBE CHANGE  12/04/2019   SPINAL FUSION     THORACIC SPINE SURGERY  01/2019   extensive washout    Medications Prior to Admission  Medication Sig Dispense Refill Last Dose/Taking   acetaminophen  (TYLENOL ) 325 MG tablet Take 650 mg by mouth in the morning and at bedtime.   06/22/2024   bisacodyl  (DULCOLAX) 10 MG suppository Place 10 mg rectally daily as needed for moderate constipation.   Taking As Needed   bisacodyl  (DULCOLAX) 5 MG EC tablet Take 2 tablets (10 mg total) by mouth at bedtime.   06/21/2024   carvedilol  (COREG ) 6.25 MG tablet Take 1 tablet (6.25 mg total) by mouth 2 (two) times daily with a meal. (Patient taking differently: Take 6.25 mg by mouth 2 (two) times daily with a meal. Every Tuesday, Thursday, Saturday and sunday)   06/21/2024   cyanocobalamin  1000 MCG tablet Take 1 tablet (1,000 mcg total) by mouth daily.   06/22/2024   diltiazem  (CARDIZEM  CD) 240 MG 24 hr capsule Take 1 capsule (240 mg total) by mouth every evening.   06/21/2024   Doxepin  HCl 6 MG TABS Take 6 mg by mouth at bedtime.   06/21/2024   famotidine  (PEPCID ) 10 MG tablet Take 1 tablet (10 mg total) by mouth daily.   06/22/2024   folic acid  (FOLVITE ) 1 MG tablet Take 1 tablet (1  mg total) by mouth daily.   06/22/2024   furosemide  (LASIX ) 80 MG tablet Take 1 tablet (80 mg total) by mouth daily. (Patient taking differently: Take 80 mg by mouth at bedtime.)   06/21/2024   gabapentin  (NEURONTIN ) 300 MG capsule Take 300 mg by mouth at bedtime.   06/21/2024   guaifenesin (ROBITUSSIN) 100 MG/5ML syrup Take 200 mg by mouth 3 (three) times daily as needed for cough.   Taking As Needed   insulin  glargine-yfgn (SEMGLEE ) 100 UNIT/ML injection Inject 0.1 mLs  (10 Units total) into the skin 2 (two) times daily. (Patient taking differently: Inject 12-14 Units into the skin 2 (two) times daily.)   06/22/2024 Morning   insulin  lispro (HUMALOG ) 100 UNIT/ML injection Inject 0-15 Units into the skin 4 (four) times daily -  before meals and at bedtime. <150= 0 units 151-200= 3 units 201-250= 6 units 251-300= 9 units 301-350= 12 units 351-400= 15 units >400 call MD (Patient taking differently: Inject 0-15 Units into the skin 4 (four) times daily -  before meals and at bedtime. <150= 0 units 151-200= 3 units 201-250= 6 units 251-300= 9 units 301-350= 12 units 351-400= 15 units >400 call MD)   06/22/2024   ipratropium-albuterol  (DUONEB) 0.5-2.5 (3) MG/3ML SOLN Take 3 mLs by nebulization every 6 (six) hours as needed.   Taking As Needed   Lactulose  20 GM/30ML SOLN Take 30 mLs by mouth 2 (two) times daily.   06/22/2024   levothyroxine  (SYNTHROID ) 175 MCG tablet Take 175 mcg by mouth at bedtime.   06/21/2024   linaclotide  (LINZESS ) 145 MCG CAPS capsule Take 1 capsule (145 mcg total) by mouth daily before breakfast. 30 capsule 0 06/22/2024   Melatonin 5 MG TABS Take 10 mg by mouth at bedtime.   06/21/2024   methylPREDNISolone  sodium succinate (SOLU-MEDROL ) 125 mg/2 mL injection Inject 125 mg into the muscle once.   Taking   metoCLOPramide  (REGLAN ) 10 MG tablet Take 0.5 tablets (5 mg total) by mouth every 8 (eight) hours as needed for nausea or vomiting.   06/22/2024   midodrine  (PROAMATINE ) 5 MG tablet Take 5 mg by mouth 3 (three) times daily with meals as needed.   Taking As Needed   ondansetron  (ZOFRAN ) 4 MG tablet Take 4 mg by mouth every 8 (eight) hours as needed for nausea or vomiting.   Taking As Needed   Oxycodone  HCl 10 MG TABS Take 0.5-1 tablets (5-10 mg total) by mouth every 6 (six) hours as needed. 30 tablet 0 06/22/2024   polyethylene glycol (MIRALAX  / GLYCOLAX ) 17 g packet Take 17 g by mouth 2 (two) times daily. 14 each 0 06/22/2024 Morning   senna-docusate  (SENOKOT-S) 8.6-50 MG tablet Take 1 tablet by mouth 2 (two) times daily.   06/22/2024 Morning   simethicone  (MYLICON) 80 MG chewable tablet Chew 1 tablet (80 mg total) by mouth 4 (four) times daily.   06/22/2024   sitaGLIPtin (JANUVIA) 25 MG tablet Take 25 mg by mouth daily.   06/22/2024   Vitamin D , Ergocalciferol , (DRISDOL ) 1.25 MG (50000 UNIT) CAPS capsule Take 1 capsule (50,000 Units total) by mouth every 7 (seven) days.   06/19/2024   hydrocortisone  cream 1 % Apply 1 Application topically daily. (Patient not taking: Reported on 06/22/2024)   Not Taking   Social History   Socioeconomic History   Marital status: Single    Spouse name: Not on file   Number of children: Not on file   Years of education: Not on file  Highest education level: Not on file  Occupational History   Not on file  Tobacco Use   Smoking status: Some Days    Current packs/day: 0.25    Types: Cigarettes   Smokeless tobacco: Never  Vaping Use   Vaping status: Never Used  Substance and Sexual Activity   Alcohol use: Not Currently   Drug use: Not Currently   Sexual activity: Not Currently  Other Topics Concern   Not on file  Social History Narrative   Resides at [employer]   Social Drivers of Health   Financial Resource Strain: Medium Risk (02/02/2019)   Overall Financial Resource Strain (CARDIA)    Difficulty of Paying Living Expenses: Somewhat hard  Food Insecurity: Patient Declined (06/23/2024)   Hunger Vital Sign    Worried About Running Out of Food in the Last Year: Patient declined    Ran Out of Food in the Last Year: Patient declined  Transportation Needs: No Transportation Needs (06/23/2024)   PRAPARE - Administrator, Civil Service (Medical): No    Lack of Transportation (Non-Medical): No  Physical Activity: Unknown (02/02/2019)   Exercise Vital Sign    Days of Exercise per Week: 0 days    Minutes of Exercise per Session: Not on file  Stress: Stress Concern Present (02/02/2019)    [employer] of Occupational Health - Occupational Stress Questionnaire    Feeling of Stress : To some extent  Social Connections: Somewhat Isolated (02/02/2019)   Social Connection and Isolation Panel    Frequency of Communication with Friends and Family: More than three times a week    Frequency of Social Gatherings with Friends and Family: More than three times a week    Attends Religious Services: More than 4 times per year    Active Member of [employer] or Organizations: Not on file    Attends Banker Meetings: Never    Marital Status: Never married  Intimate Partner Violence: Patient Declined (06/23/2024)   Humiliation, Afraid, Rape, and Kick questionnaire    Fear of Current or Ex-Partner: Patient declined    Emotionally Abused: Patient declined    Physically Abused: Patient declined    Sexually Abused: Patient declined    Family History  Problem Relation Age of Onset   Kidney failure Father    Kidney failure Brother      Vitals:   06/24/24 0700 06/24/24 0744 06/24/24 0758 06/24/24 0800  BP: (!) 143/71  (!) 153/78 (!) 153/78  Pulse: (!) 51  (!) 33 70  Resp: (!) 25  (!) 8 19  Temp:  (!) 97.4 F (36.3 C)    TempSrc:  Oral    SpO2: 92%  92% 90%  Weight:      Height:        PHYSICAL EXAM General: Chronically ill appearing male, well nourished, in no acute distress. HEENT: Normocephalic and atraumatic. Neck: No JVD.  Lungs: Normal respiratory effort on 6L [place]. Clear bilaterally to auscultation. Bibasilar crackles Heart: HRRR. Normal S1 and S2 without gallops or murmurs.  Abdomen: Non-distended appearing.  Msk: Normal strength and tone for age. Extremities: Warm and well perfused. No clubbing, cyanosis. No edema.  Neuro: Alert and oriented X 3. Psych: Answers questions appropriately.   Labs: Basic Metabolic Panel: Recent Labs    06/22/24 1553 06/23/24 0347 06/24/24 0505  NA 133* 131* 134*  K 5.4* 4.7 5.1  CL 99 97* 99  CO2 20* 23 25  GLUCOSE  335* 373* 366*  BUN 63* 51* 65*  CREATININE 6.07* 4.65* 5.12*  CALCIUM  7.7* 7.5* 7.4*  MG 2.5*  --   --   PHOS 7.1*  --   --    Liver Function Tests: Recent Labs    06/22/24 1553  AST 114*  ALT 102*  ALKPHOS 70  BILITOT 0.6  PROT 7.0  ALBUMIN  2.4*   No results for input(s): LIPASE, AMYLASE in the last 72 hours. CBC: Recent Labs    06/22/24 1553 06/23/24 0347 06/24/24 0505  WBC 15.1* 19.2* 9.6  NEUTROABS 11.4*  --   --   HGB 9.7* 9.1* 9.0*  HCT 31.7* 29.9* 29.7*  MCV 96.4 95.5 96.1  PLT 214 210 201   Cardiac Enzymes: Recent Labs    06/22/24 1553 06/22/24 1823 06/23/24 0347  TROPONINIHS 87* 196* 260*   BNP: Recent Labs    06/22/24 1553  BNP 669.6*   D-Dimer: No results for input(s): DDIMER in the last 72 hours. Hemoglobin A1C: No results for input(s): HGBA1C in the last 72 hours. Fasting Lipid Panel: No results for input(s): CHOL, HDL, LDLCALC, TRIG, CHOLHDL, LDLDIRECT in the last 72 hours. Thyroid Function Tests: No results for input(s): TSH, T4TOTAL, T3FREE, THYROIDAB in the last 72 hours.  Invalid input(s): FREET3 Anemia Panel: No results for input(s): VITAMINB12, FOLATE, FERRITIN, TIBC, IRON , RETICCTPCT in the last 72 hours.   Radiology: CT CHEST WO CONTRAST Result Date: 06/23/2024 CLINICAL DATA:  Chronic dyspnea EXAM: CT CHEST WITHOUT CONTRAST TECHNIQUE: Multidetector CT imaging of the chest was performed following the standard protocol without IV contrast. RADIATION DOSE REDUCTION: This exam was performed according to the departmental dose-optimization program which includes automated exposure control, adjustment of the mA and/or kV according to patient size and/or use of iterative reconstruction technique. COMPARISON:  June 22, 2024 and prior studies FINDINGS: Cardiovascular: Normal caliber thoracic aorta. Right IJ approach tunneled central venous catheter with distal tip at the cavoatrial junction.  Coronary artery calcifications. Mediastinum/Nodes: No lymphadenopathy. Lungs/Pleura: Similar appearance of bilateral lower lobe consolidations. Small partially loculated left and trace right pleural effusions, similar to prior. Scattered areas with interlobular septal thickening and few areas of ground-glass may be related to mild interstitial pulmonary edema. Upper Abdomen: No acute findings. Musculoskeletal: Unchanged T5-T11 posterior spinal fixation hardware. Associated streak artifact limits evaluation of the posterior chest wall. IMPRESSION: 1. Unchanged chronic bilateral lower lobe consolidations, nonspecific and possibly representing chronic atelectasis. Small partially loculated left and trace right pleural effusions, similar to prior chest CT. 2.  Findings suggestive of mild interstitial pulmonary edema. Electronically Signed   By: Michaeline Blanch M.D.   On: 06/23/2024 11:03   CT Head Wo Contrast Result Date: 06/22/2024 CLINICAL DATA:  Altered mental status.  Hallucination. EXAM: CT HEAD WITHOUT CONTRAST TECHNIQUE: Contiguous axial images were obtained from the base of the skull through the vertex without intravenous contrast. RADIATION DOSE REDUCTION: This exam was performed according to the departmental dose-optimization program which includes automated exposure control, adjustment of the mA and/or kV according to patient size and/or use of iterative reconstruction technique. COMPARISON:  05/11/2024 FINDINGS: Brain: Ventricles, cisterns and other CSF spaces are normal. No mass, mass effect, shift of midline structures or acute hemorrhage. No evidence of acute infarction. Vascular: No hyperdense vessel or unexpected calcification. Skull: Normal. Negative for fracture or focal lesion. Sinuses/Orbits: Orbits are normal. Paranasal sinuses are well aerated and otherwise clear. Other: None. IMPRESSION: No acute findings. Electronically Signed   By: Toribio Agreste M.D.   On: 06/22/2024  17:32   DG Chest Portable  1 View Result Date: 06/22/2024 CLINICAL DATA:  Shortness of breath EXAM: PORTABLE CHEST 1 VIEW COMPARISON:  05/27/2024, CT 05/11/2024, 04/19/2024, 10/22/2019 FINDINGS: Limited by patient positioning. Right-sided central venous. Similar small left greater than right pleural effusions with probable loculation on the left. Persistent bibasilar airspace disease. No pneumothorax IMPRESSION: Similar small left greater than right pleural effusions with probable loculation on the left. Persistent bibasilar airspace disease. Cardiomegaly Electronically Signed   By: Luke Bun M.D.   On: 06/22/2024 16:24   DG Chest 2 View Result Date: 05/27/2024 CLINICAL DATA:  Hypoxia. EXAM: CHEST - 2 VIEW COMPARISON:  Radiograph 05/25/2024 FINDINGS: Right-sided dialysis catheter unchanged. Cardiomegaly is unchanged. Stable left pleural effusion and basilar opacity. Small right pleural effusion, unchanged. No new airspace disease. Thoracic spine hardware is again seen. IMPRESSION: 1. Stable left pleural effusion and basilar opacity. 2. Small right pleural effusion, unchanged. 3. Unchanged cardiomegaly. Electronically Signed   By: Andrea Gasman M.D.   On: 05/27/2024 19:19   NM Pulmonary Perfusion Result Date: 05/27/2024 CLINICAL DATA:  Hypoxemia EXAM: NUCLEAR MEDICINE PERFUSION LUNG SCAN TECHNIQUE: Perfusion images were obtained in multiple projections after intravenous injection of radiopharmaceutical. RADIOPHARMACEUTICALS:  4.5 mCi Tc-27m MAA COMPARISON:  Radiograph 05/27/2024, CT chest 05/11/2024 FINDINGS: No wedge-shaped peripheral perfusion defects to localize acute pulmonary embolism. Small peripheral defects in the RIGHT lower are favored atelectasis. Generalize attenuation in the LEFT lower lobe related to effusions. IMPRESSION: No evidence acute pulmonary embolism. Electronically Signed   By: Jackquline Boxer M.D.   On: 05/27/2024 17:36   US  Venous Img Lower Bilateral (DVT) Result Date: 05/27/2024 CLINICAL DATA:   Bilateral lower extremity swelling EXAM: BILATERAL LOWER EXTREMITY VENOUS DOPPLER ULTRASOUND TECHNIQUE: Gray-scale sonography with graded compression, as well as color Doppler and duplex ultrasound were performed to evaluate the lower extremity deep venous systems from the level of the common femoral vein and including the common femoral, femoral, profunda femoral, popliteal and calf veins including the posterior tibial, peroneal and gastrocnemius veins when visible. The superficial great saphenous vein was also interrogated. Spectral Doppler was utilized to evaluate flow at rest and with distal augmentation maneuvers in the common femoral, femoral and popliteal veins. COMPARISON:  None Available. FINDINGS: RIGHT LOWER EXTREMITY Common Femoral Vein: No evidence of thrombus. Normal compressibility, respiratory phasicity and response to augmentation. Saphenofemoral Junction: No evidence of thrombus. Normal compressibility and flow on color Doppler imaging. Profunda Femoral Vein: No evidence of thrombus. Normal compressibility and flow on color Doppler imaging. Femoral Vein: No evidence of thrombus. Normal compressibility, respiratory phasicity and response to augmentation. Popliteal Vein: No evidence of thrombus. Normal compressibility, respiratory phasicity and response to augmentation. Calf Veins: Below the knee amputation. Superficial Great Saphenous Vein: No evidence of thrombus. Normal compressibility. Venous Reflux:  None. Other Findings:  None. LEFT LOWER EXTREMITY Common Femoral Vein: No evidence of thrombus. Normal compressibility, respiratory phasicity and response to augmentation. Saphenofemoral Junction: No evidence of thrombus. Normal compressibility and flow on color Doppler imaging. Profunda Femoral Vein: No evidence of thrombus. Normal compressibility and flow on color Doppler imaging. Femoral Vein: No evidence of thrombus. Normal compressibility, respiratory phasicity and response to augmentation.  Popliteal Vein: No evidence of thrombus. Normal compressibility, respiratory phasicity and response to augmentation. Calf Veins: Below the knee amputation. Superficial Great Saphenous Vein: No evidence of thrombus. Normal compressibility. Venous Reflux:  None. Other Findings:  None. IMPRESSION: No evidence of deep venous thrombosis in either lower extremity. Electronically Signed  By: Wilkie Lent M.D.   On: 05/27/2024 08:55   DG Chest Port 1 View Result Date: 05/25/2024 CLINICAL DATA:  Shortness of breath. EXAM: PORTABLE CHEST 1 VIEW COMPARISON:  Radiograph 05/21/2024, most recent CT 05/11/24 FINDINGS: Right-sided dialysis catheter tip overlies the SVC. Bilateral pleural effusions, left greater than right with bibasilar opacities, appearing rounded on the left. Cardiomegaly is stable. No pulmonary edema or pneumothorax. Postsurgical change in the thoracolumbar spine. IMPRESSION: 1. New right-sided dialysis catheter. Otherwise stable radiographic appearance of the chest. Unchanged bilateral pleural effusions, left greater than right with bibasilar opacities. 2. Stable cardiomegaly. Electronically Signed   By: Andrea Gasman M.D.   On: 05/25/2024 18:35    ECHO 03/2024:  1. Left ventricular ejection fraction, by estimation, is 55 to 60%. The left ventricle has normal function. The left ventricle has no regional wall motion abnormalities. Left ventricular diastolic function could not be evaluated.   2. Right ventricular systolic function is normal. The right ventricular  size is not well visualized.   3. The mitral valve is normal in structure. No evidence of mitral valve  regurgitation.   4. The aortic valve is normal in structure. Aortic valve regurgitation is  not visualized.   TELEMETRY reviewed by me 06/24/2024: sinus rhythm PACs rate 60s  EKG reviewed by me: NSR 1st degree AVB rate 86 bpm  Data reviewed by me 06/24/2024: last 24h vitals tele labs imaging I/O ED provider note, admission H&P,  hospitalist progress note  Principal Problem:   Severe sepsis with acute organ dysfunction (HCC) Active Problems:   Type 2 diabetes mellitus with hyperglycemia, with long-term current use of insulin  (HCC)   HTN (hypertension)   Dyslipidemia   Chronic pain syndrome   GERD (gastroesophageal reflux disease)   Tobacco use disorder   Diabetic gastroparesis (HCC)   Hypothyroidism   Anasarca associated with disorder of kidney   Obesity, Class III, BMI 40-49.9 (morbid obesity) (HCC)   Elevated troponin    ASSESSMENT AND PLAN:  Eugene [name] is a 63 y.o. male  with a past medical history of morbid obesity, end-stage renal disease on hemodialysis, insulin -dependent diabetes mellitus 2, hypothyroid, hypertension who presented to the ED on 06/22/2024 for SOB and altered mental status. Troponins found to be elevated in the setting of sepsis. Cardiology was consulted for further evaluation.   # Sepsis # Demand ischemia # ESRD on HD # Hypertension Patient presented with SOB and AMS, found to have respiratory decompensation and placed on BiPAP. Dialysis yesterday evening after presentation with improvement in mental status and respiratory status. Had echo in July with preserved EF, no WMAs.  -Continue with regular dialysis scheduled. -Plan for thoracentesis later today. -Continue diltiazem  240 mg daily and carvedilol  6.25 mg twice daily. -Continue heparin  gtt, will DC at 2000 tonight. -Mildly elevated troponin most consistent with demand/supply mismatch and not ACS in the setting of sepsis, acute hypoxic respiratory failure. No plan for further cardiac diagnostics at this time.  -Further management of sepsis per primary team.   This patient's plan of care was discussed and created with Dr. Florencio and he is in agreement.  Signed: Danita Bloch, PA-C  06/24/2024, 8:28 AM [facility]

## 2024-06-24 NOTE — Progress Notes (Addendum)
 0840 patient alert on 7L  transporting to HDhep gtt running  1330 patient back from HD tolerated well 1515 patient going to ultrasound

## 2024-06-24 NOTE — Procedures (Signed)
 PROCEDURE SUMMARY:  Successful image-guided diagnostic and therapeutic left-sided thoracentesis. Yielded 0.250 liters of clear, straw-colored pleural fluid. Patient tolerated procedure well. EBL: Zero No immediate complications.  Specimen was sent for labs. Post procedure CXR shows no pneumothorax.  Please see imaging section of Epic for full dictation.  [facility] Mabeline Varas PA-C 06/24/2024 4:51 PM

## 2024-06-24 NOTE — Progress Notes (Signed)
 PHARMACY - ANTICOAGULATION CONSULT NOTE  Pharmacy Consult for heparin  drip  Indication: chest pain/ACS/NSTEMI  No Known Allergies  Patient Measurements: Height: 5' 10 (177.8 cm) Weight: 136.1 kg (300 lb) IBW/kg (Calculated) : 73 HEPARIN  DW (KG): 104.7  Vital Signs: Temp: 98.1 F (36.7 C) (09/30 2000) Temp Source: Oral (09/30 2000) BP: 114/54 (09/30 2300) Pulse Rate: 66 (09/30 2300)  Labs: Recent Labs    06/22/24 1553 06/22/24 1553 06/22/24 1823 06/22/24 1944 06/23/24 0347 06/23/24 1123 06/23/24 2007 06/24/24 0505  HGB 9.7*  --   --   --  9.1*  --   --  9.0*  HCT 31.7*  --   --   --  29.9*  --   --  29.7*  PLT 214  --   --   --  210  --   --  201  APTT  --   --   --  114*  --   --   --   --   LABPROT  --   --   --  14.5  --   --   --   --   INR  --   --   --  1.1  --   --   --   --   HEPARINUNFRC  --    < >  --   --  0.15* 0.22* 0.36 <0.10*  CREATININE 6.07*  --   --   --  4.65*  --   --  5.12*  TROPONINIHS 87*  --  196*  --  260*  --   --   --    < > = values in this interval not displayed.    Estimated Creatinine Clearance: 21 mL/min (A) (by C-G formula based on SCr of 5.12 mg/dL (H)).   Medical History: Past Medical History:  Diagnosis Date   Anxiety    Depression    Diabetes mellitus without complication (HCC)    Hepatitis C    Hyperlipidemia    IBS (irritable bowel syndrome)    Osteomyelitis (HCC)    Spinal stenosis     Medications:  Medications Prior to Admission  Medication Sig Dispense Refill Last Dose/Taking   acetaminophen  (TYLENOL ) 325 MG tablet Take 650 mg by mouth in the morning and at bedtime.   06/22/2024   bisacodyl  (DULCOLAX) 10 MG suppository Place 10 mg rectally daily as needed for moderate constipation.   Taking As Needed   bisacodyl  (DULCOLAX) 5 MG EC tablet Take 2 tablets (10 mg total) by mouth at bedtime.   06/21/2024   carvedilol  (COREG ) 6.25 MG tablet Take 1 tablet (6.25 mg total) by mouth 2 (two) times daily with a meal.  (Patient taking differently: Take 6.25 mg by mouth 2 (two) times daily with a meal. Every Tuesday, Thursday, Saturday and sunday)   06/21/2024   cyanocobalamin  1000 MCG tablet Take 1 tablet (1,000 mcg total) by mouth daily.   06/22/2024   diltiazem  (CARDIZEM  CD) 240 MG 24 hr capsule Take 1 capsule (240 mg total) by mouth every evening.   06/21/2024   Doxepin  HCl 6 MG TABS Take 6 mg by mouth at bedtime.   06/21/2024   famotidine  (PEPCID ) 10 MG tablet Take 1 tablet (10 mg total) by mouth daily.   06/22/2024   folic acid  (FOLVITE ) 1 MG tablet Take 1 tablet (1 mg total) by mouth daily.   06/22/2024   furosemide  (LASIX ) 80 MG tablet Take 1 tablet (80 mg total) by mouth daily. (  Patient taking differently: Take 80 mg by mouth at bedtime.)   06/21/2024   gabapentin  (NEURONTIN ) 300 MG capsule Take 300 mg by mouth at bedtime.   06/21/2024   guaifenesin (ROBITUSSIN) 100 MG/5ML syrup Take 200 mg by mouth 3 (three) times daily as needed for cough.   Taking As Needed   insulin  glargine-yfgn (SEMGLEE ) 100 UNIT/ML injection Inject 0.1 mLs (10 Units total) into the skin 2 (two) times daily. (Patient taking differently: Inject 12-14 Units into the skin 2 (two) times daily.)   06/22/2024 Morning   insulin  lispro (HUMALOG ) 100 UNIT/ML injection Inject 0-15 Units into the skin 4 (four) times daily -  before meals and at bedtime. <150= 0 units 151-200= 3 units 201-250= 6 units 251-300= 9 units 301-350= 12 units 351-400= 15 units >400 call MD (Patient taking differently: Inject 0-15 Units into the skin 4 (four) times daily -  before meals and at bedtime. <150= 0 units 151-200= 3 units 201-250= 6 units 251-300= 9 units 301-350= 12 units 351-400= 15 units >400 call MD)   06/22/2024   ipratropium-albuterol  (DUONEB) 0.5-2.5 (3) MG/3ML SOLN Take 3 mLs by nebulization every 6 (six) hours as needed.   Taking As Needed   Lactulose  20 GM/30ML SOLN Take 30 mLs by mouth 2 (two) times daily.   06/22/2024   levothyroxine  (SYNTHROID ) 175  MCG tablet Take 175 mcg by mouth at bedtime.   06/21/2024   linaclotide  (LINZESS ) 145 MCG CAPS capsule Take 1 capsule (145 mcg total) by mouth daily before breakfast. 30 capsule 0 06/22/2024   Melatonin 5 MG TABS Take 10 mg by mouth at bedtime.   06/21/2024   methylPREDNISolone  sodium succinate (SOLU-MEDROL ) 125 mg/2 mL injection Inject 125 mg into the muscle once.   Taking   metoCLOPramide  (REGLAN ) 10 MG tablet Take 0.5 tablets (5 mg total) by mouth every 8 (eight) hours as needed for nausea or vomiting.   06/22/2024   midodrine  (PROAMATINE ) 5 MG tablet Take 5 mg by mouth 3 (three) times daily with meals as needed.   Taking As Needed   ondansetron  (ZOFRAN ) 4 MG tablet Take 4 mg by mouth every 8 (eight) hours as needed for nausea or vomiting.   Taking As Needed   Oxycodone  HCl 10 MG TABS Take 0.5-1 tablets (5-10 mg total) by mouth every 6 (six) hours as needed. 30 tablet 0 06/22/2024   polyethylene glycol (MIRALAX  / GLYCOLAX ) 17 g packet Take 17 g by mouth 2 (two) times daily. 14 each 0 06/22/2024 Morning   senna-docusate (SENOKOT-S) 8.6-50 MG tablet Take 1 tablet by mouth 2 (two) times daily.   06/22/2024 Morning   simethicone  (MYLICON) 80 MG chewable tablet Chew 1 tablet (80 mg total) by mouth 4 (four) times daily.   06/22/2024   sitaGLIPtin (JANUVIA) 25 MG tablet Take 25 mg by mouth daily.   06/22/2024   Vitamin D , Ergocalciferol , (DRISDOL ) 1.25 MG (50000 UNIT) CAPS capsule Take 1 capsule (50,000 Units total) by mouth every 7 (seven) days.   06/19/2024   hydrocortisone  cream 1 % Apply 1 Application topically daily. (Patient not taking: Reported on 06/22/2024)   Not Taking   Scheduled:   carvedilol   6.25 mg Oral BID WC   Chlorhexidine  Gluconate Cloth  6 each Topical Q0600   diltiazem   240 mg Oral QPM   insulin  aspart  0-20 Units Subcutaneous TID AC & HS   insulin  glargine  12 Units Subcutaneous BID   levothyroxine   175 mcg Oral Q0600   melatonin  10 mg Oral QHS   Infusions:   azithromycin  500 mg  (06/23/24 1213)   cefTRIAXone  (ROCEPHIN )  IV Stopped (06/23/24 1245)   heparin  1,900 Units/hr (06/23/24 2231)    Assessment: 63 yo M to start heparin  drip for NSTEMI.  Admitted w/ SHOB, AMS, sepsis.   Yk:fnmapi obesity, diabetes, CHF, chronic hypoxic respiratory failure on 5 to 6 L, ESRD on HD, SVT, neurogenic bladder s/p suprapubic catheter, and chronic pain syndrome  No anticoagulation PTA per med rec and Rx fill hx  Baseline:  Hgb 9.7  Plt 214   INR 1.1  aPTT 114 (drawn after heparin  drip started)    Goal of Therapy:  Heparin  level 0.3-0.7 units/ml Monitor platelets by anticoagulation protocol: Yes  0930 0347 HL 0.15 0930 1123 HL 0.22 0930 2007 HL 0.36 Therapeutic x 1 (1900 un/hr) 1001 0505 HL <0.10 Subtherapeutic     Plan: -  Insurance claims handler.  Checked IV/lines - no known issues - Bolus 3100 - Increase heparin  infusion to 2200 units/hr - Recheck HL in 8 hrs after rate change - CBC daily while on heparin   Rankin CANDIE Dills, PharmD, [facility] 06/24/2024 6:39 AM

## 2024-06-24 NOTE — Progress Notes (Addendum)
 [facility] Liaison Note  This patient is currently followed by [facility]'s outpatient based palliative care program.  [facility] will follow through discharge disposition.  Please call with any palliative care questions or concerns.  [facility] Liaison [phone]

## 2024-06-24 NOTE — Plan of Care (Signed)
 Patient can be verbally abusive, cursing, yelling, and being hostile.  Patient states fuck dialysis. Unknown if he will accept treatment today. He expresses dissatisfaction with his sliding scale and dosing of insulin , Dr. Cleatus was contacted and dose was adjusted.    Problem: Education: Goal: Ability to describe self-care measures that may prevent or decrease complications (Diabetes Survival Skills Education) will improve Outcome: Not Progressing   Problem: Coping: Goal: Ability to adjust to condition or change in health will improve Outcome: Not Progressing   Problem: Fluid Volume: Goal: Ability to maintain a balanced intake and output will improve Outcome: Not Progressing   Problem: Health Behavior/Discharge Planning: Goal: Ability to identify and utilize available resources and services will improve Outcome: Not Progressing Goal: Ability to manage health-related needs will improve Outcome: Not Progressing   Problem: Metabolic: Goal: Ability to maintain appropriate glucose levels will improve Outcome: Not Progressing   Problem: Nutritional: Goal: Maintenance of adequate nutrition will improve Outcome: Not Progressing Goal: Progress toward achieving an optimal weight will improve Outcome: Not Progressing   Problem: Skin Integrity: Goal: Risk for impaired skin integrity will decrease Outcome: Not Progressing   Problem: Tissue Perfusion: Goal: Adequacy of tissue perfusion will improve Outcome: Not Progressing   Problem: Education: Goal: Knowledge of General Education information will improve Description: Including pain rating scale, medication(s)/side effects and non-pharmacologic comfort measures Outcome: Not Progressing   Problem: Health Behavior/Discharge Planning: Goal: Ability to manage health-related needs will improve Outcome: Not Progressing   Problem: Clinical Measurements: Goal: Ability to maintain clinical measurements within normal limits will  improve Outcome: Not Progressing Goal: Will remain free from infection Outcome: Not Progressing Goal: Diagnostic test results will improve Outcome: Not Progressing Goal: Respiratory complications will improve Outcome: Not Progressing Goal: Cardiovascular complication will be avoided Outcome: Not Progressing   Problem: Activity: Goal: Risk for activity intolerance will decrease Outcome: Not Progressing   Problem: Nutrition: Goal: Adequate nutrition will be maintained Outcome: Not Progressing   Problem: Coping: Goal: Level of anxiety will decrease Outcome: Not Progressing   Problem: Elimination: Goal: Will not experience complications related to bowel motility Outcome: Not Progressing Goal: Will not experience complications related to urinary retention Outcome: Not Progressing   Problem: Pain Managment: Goal: General experience of comfort will improve and/or be controlled Outcome: Not Progressing   Problem: Safety: Goal: Ability to remain free from injury will improve Outcome: Not Progressing   Problem: Skin Integrity: Goal: Risk for impaired skin integrity will decrease Outcome: Not Progressing

## 2024-06-24 NOTE — Plan of Care (Signed)

## 2024-06-24 NOTE — Progress Notes (Signed)
  Received patient in bed to unit.   Informed consent signed and in chart.    TX duration:3.30 Hrs     Transported by  Hand-off given to patient's nurse.    Access used: Right internal jugular cath  Access issues: none   Total UF removed: 2000 L Medication(s) given:Epo 4,000 u  Post HD VS: wnl      GEANNIE Sar LPN [facility]

## 2024-06-24 NOTE — Progress Notes (Signed)
 PHARMACY - ANTICOAGULATION CONSULT NOTE  Pharmacy Consult for heparin  drip  Indication: chest pain/ACS/NSTEMI  No Known Allergies  Patient Measurements: Height: 5' 10 (177.8 cm) Weight: 136.1 kg (300 lb) IBW/kg (Calculated) : 73 HEPARIN  DW (KG): 104.7  Vital Signs: Temp: 98 F (36.7 C) (10/01 1700) Temp Source: Oral (10/01 1700) BP: 144/59 (10/01 1304) Pulse Rate: 29 (10/01 1304)  Labs: Recent Labs    06/22/24 1553 06/22/24 1553 06/22/24 1823 06/22/24 1944 06/23/24 0347 06/23/24 1123 06/23/24 2007 06/24/24 0505 06/24/24 1010  HGB 9.7*  --   --   --  9.1*  --   --  9.0* 8.7*  HCT 31.7*  --   --   --  29.9*  --   --  29.7* 27.7*  PLT 214  --   --   --  210  --   --  201 205  APTT  --   --   --  114*  --   --   --   --   --   LABPROT  --   --   --  14.5  --   --   --   --   --   INR  --   --   --  1.1  --   --   --   --   --   HEPARINUNFRC  --    < >  --   --  0.15* 0.22* 0.36 <0.10*  --   CREATININE 6.07*  --   --   --  4.65*  --   --  5.12*  --   TROPONINIHS 87*  --  196*  --  260*  --   --   --   --    < > = values in this interval not displayed.    Estimated Creatinine Clearance: 21 mL/min (A) (by C-G formula based on SCr of 5.12 mg/dL (H)).   Medical History: Past Medical History:  Diagnosis Date   Anxiety    Depression    Diabetes mellitus without complication (HCC)    Hepatitis C    Hyperlipidemia    IBS (irritable bowel syndrome)    Osteomyelitis (HCC)    Spinal stenosis     Medications:  Medications Prior to Admission  Medication Sig Dispense Refill Last Dose/Taking   acetaminophen  (TYLENOL ) 325 MG tablet Take 650 mg by mouth in the morning and at bedtime.   06/22/2024   bisacodyl  (DULCOLAX) 10 MG suppository Place 10 mg rectally daily as needed for moderate constipation.   Taking As Needed   bisacodyl  (DULCOLAX) 5 MG EC tablet Take 2 tablets (10 mg total) by mouth at bedtime.   06/21/2024   carvedilol  (COREG ) 6.25 MG tablet Take 1 tablet (6.25  mg total) by mouth 2 (two) times daily with a meal. (Patient taking differently: Take 6.25 mg by mouth 2 (two) times daily with a meal. Every Tuesday, Thursday, Saturday and sunday)   06/21/2024   cyanocobalamin  1000 MCG tablet Take 1 tablet (1,000 mcg total) by mouth daily.   06/22/2024   diltiazem  (CARDIZEM  CD) 240 MG 24 hr capsule Take 1 capsule (240 mg total) by mouth every evening.   06/21/2024   Doxepin  HCl 6 MG TABS Take 6 mg by mouth at bedtime.   06/21/2024   famotidine  (PEPCID ) 10 MG tablet Take 1 tablet (10 mg total) by mouth daily.   06/22/2024   folic acid  (FOLVITE ) 1 MG tablet Take 1 tablet (1 mg  total) by mouth daily.   06/22/2024   furosemide  (LASIX ) 80 MG tablet Take 1 tablet (80 mg total) by mouth daily. (Patient taking differently: Take 80 mg by mouth at bedtime.)   06/21/2024   gabapentin  (NEURONTIN ) 300 MG capsule Take 300 mg by mouth at bedtime.   06/21/2024   guaifenesin (ROBITUSSIN) 100 MG/5ML syrup Take 200 mg by mouth 3 (three) times daily as needed for cough.   Taking As Needed   insulin  glargine-yfgn (SEMGLEE ) 100 UNIT/ML injection Inject 0.1 mLs (10 Units total) into the skin 2 (two) times daily. (Patient taking differently: Inject 12-14 Units into the skin 2 (two) times daily.)   06/22/2024 Morning   insulin  lispro (HUMALOG ) 100 UNIT/ML injection Inject 0-15 Units into the skin 4 (four) times daily -  before meals and at bedtime. <150= 0 units 151-200= 3 units 201-250= 6 units 251-300= 9 units 301-350= 12 units 351-400= 15 units >400 call MD (Patient taking differently: Inject 0-15 Units into the skin 4 (four) times daily -  before meals and at bedtime. <150= 0 units 151-200= 3 units 201-250= 6 units 251-300= 9 units 301-350= 12 units 351-400= 15 units >400 call MD)   06/22/2024   ipratropium-albuterol  (DUONEB) 0.5-2.5 (3) MG/3ML SOLN Take 3 mLs by nebulization every 6 (six) hours as needed.   Taking As Needed   Lactulose  20 GM/30ML SOLN Take 30 mLs by mouth 2 (two) times  daily.   06/22/2024   levothyroxine  (SYNTHROID ) 175 MCG tablet Take 175 mcg by mouth at bedtime.   06/21/2024   linaclotide  (LINZESS ) 145 MCG CAPS capsule Take 1 capsule (145 mcg total) by mouth daily before breakfast. 30 capsule 0 06/22/2024   Melatonin 5 MG TABS Take 10 mg by mouth at bedtime.   06/21/2024   methylPREDNISolone  sodium succinate (SOLU-MEDROL ) 125 mg/2 mL injection Inject 125 mg into the muscle once.   Taking   metoCLOPramide  (REGLAN ) 10 MG tablet Take 0.5 tablets (5 mg total) by mouth every 8 (eight) hours as needed for nausea or vomiting.   06/22/2024   midodrine  (PROAMATINE ) 5 MG tablet Take 5 mg by mouth 3 (three) times daily with meals as needed.   Taking As Needed   ondansetron  (ZOFRAN ) 4 MG tablet Take 4 mg by mouth every 8 (eight) hours as needed for nausea or vomiting.   Taking As Needed   Oxycodone  HCl 10 MG TABS Take 0.5-1 tablets (5-10 mg total) by mouth every 6 (six) hours as needed. 30 tablet 0 06/22/2024   polyethylene glycol (MIRALAX  / GLYCOLAX ) 17 g packet Take 17 g by mouth 2 (two) times daily. 14 each 0 06/22/2024 Morning   senna-docusate (SENOKOT-S) 8.6-50 MG tablet Take 1 tablet by mouth 2 (two) times daily.   06/22/2024 Morning   simethicone  (MYLICON) 80 MG chewable tablet Chew 1 tablet (80 mg total) by mouth 4 (four) times daily.   06/22/2024   sitaGLIPtin (JANUVIA) 25 MG tablet Take 25 mg by mouth daily.   06/22/2024   Vitamin D , Ergocalciferol , (DRISDOL ) 1.25 MG (50000 UNIT) CAPS capsule Take 1 capsule (50,000 Units total) by mouth every 7 (seven) days.   06/19/2024   hydrocortisone  cream 1 % Apply 1 Application topically daily. (Patient not taking: Reported on 06/22/2024)   Not Taking   Scheduled:   carvedilol   6.25 mg Oral BID WC   Chlorhexidine  Gluconate Cloth  6 each Topical Q0600   diltiazem   240 mg Oral QPM   epoetin  alfa-epbx (RETACRIT ) injection  4,000 Units  Intravenous Q M,W,F-1800   famotidine   10 mg Oral Daily   insulin  aspart  0-20 Units Subcutaneous TID  AC & HS   insulin  glargine  12 Units Subcutaneous BID   levothyroxine   175 mcg Oral Q0600   [START ON 06/25/2024] linaclotide   145 mcg Oral QAC breakfast   melatonin  10 mg Oral QHS   Infusions:   azithromycin  Stopped (06/24/24 1506)   cefTRIAXone  (ROCEPHIN )  IV Stopped (06/24/24 1405)   heparin  2,200 Units/hr (06/24/24 1800)    Assessment: 63 yo M to start heparin  drip for NSTEMI.  Admitted w/ SHOB, AMS, sepsis.   Yk:fnmapi obesity, diabetes, CHF, chronic hypoxic respiratory failure on 5 to 6 L, ESRD on HD, SVT, neurogenic bladder s/p suprapubic catheter, and chronic pain syndrome  No anticoagulation PTA per med rec and Rx fill hx  Baseline:  Hgb 9.7  Plt 214   INR 1.1  aPTT 114 (drawn after heparin  drip started)    Goal of Therapy:  Heparin  level 0.3-0.7 units/ml Monitor platelets by anticoagulation protocol: Yes  0930 0347 HL 0.15 0930 1123 HL 0.22 0930 2007 HL 0.36 Therapeutic x 1 (1900 un/hr) 1001 0505 HL <0.10 Subtherapeutic 1001 1500 pt refused HL draw, then lab tried multiple times later but it was clotting per ICU RN. Drip set to stop at 2000     Plan: - continue heparin  infusion to 2200 units/hr. Heparin  drip to stop 10/1 @ 2200 - CBC daily while on heparin   Allean Haas PharmD Clinical Pharmacist 06/24/2024

## 2024-06-25 DIAGNOSIS — A419 Sepsis, unspecified organism: Secondary | ICD-10-CM | POA: Diagnosis not present

## 2024-06-25 DIAGNOSIS — R6520 Severe sepsis without septic shock: Secondary | ICD-10-CM | POA: Diagnosis not present

## 2024-06-25 LAB — CBC
HCT: 29.3 % — ABNORMAL LOW (ref 39.0–52.0)
Hemoglobin: 9.1 g/dL — ABNORMAL LOW (ref 13.0–17.0)
MCH: 29.4 pg (ref 26.0–34.0)
MCHC: 31.1 g/dL (ref 30.0–36.0)
MCV: 94.8 fL (ref 80.0–100.0)
Platelets: 186 K/uL (ref 150–400)
RBC: 3.09 MIL/uL — ABNORMAL LOW (ref 4.22–5.81)
RDW: 14.7 % (ref 11.5–15.5)
WBC: 8.9 K/uL (ref 4.0–10.5)
nRBC: 0 % (ref 0.0–0.2)

## 2024-06-25 LAB — URINE CULTURE: Culture: >=100000 — AB

## 2024-06-25 LAB — PATHOLOGIST SMEAR REVIEW

## 2024-06-25 LAB — GLUCOSE, CAPILLARY
Glucose-Capillary: 284 mg/dL — ABNORMAL HIGH (ref 70–99)
Glucose-Capillary: 335 mg/dL — ABNORMAL HIGH (ref 70–99)
Glucose-Capillary: 260 mg/dL — ABNORMAL HIGH (ref 70–99)
Glucose-Capillary: 257 mg/dL — ABNORMAL HIGH (ref 70–99)

## 2024-06-25 LAB — BASIC METABOLIC PANEL WITH GFR
Anion gap: 8 (ref 5–15)
BUN: 41 mg/dL — ABNORMAL HIGH (ref 8–23)
CO2: 25 mmol/L (ref 22–32)
Calcium: 7.3 mg/dL — ABNORMAL LOW (ref 8.9–10.3)
Chloride: 99 mmol/L (ref 98–111)
Creatinine, Ser: 3.51 mg/dL — ABNORMAL HIGH (ref 0.61–1.24)
GFR, Estimated: 19 mL/min — ABNORMAL LOW (ref >60)
Glucose, Bld: 283 mg/dL — ABNORMAL HIGH (ref 70–99)
Potassium: 3.7 mmol/L (ref 3.5–5.1)
Sodium: 132 mmol/L — ABNORMAL LOW (ref 135–145)

## 2024-06-25 MED ORDER — HEPARIN SODIUM (PORCINE) 5000 UNIT/ML IJ SOLN
5000.0000 [IU] | Freq: Three times a day (TID) | INTRAMUSCULAR | Status: DC
  Administered 2024-06-25 – 2024-06-30 (×13): 5000 [IU] via SUBCUTANEOUS
  Filled 2024-06-25 (×13): qty 1

## 2024-06-25 MED ORDER — BISACODYL 5 MG PO TBEC
10.0000 mg | DELAYED_RELEASE_TABLET | Freq: Every day | ORAL | Status: DC | PRN
  Filled 2024-06-25: qty 2

## 2024-06-25 MED ORDER — INSULIN ASPART 100 UNIT/ML IJ SOLN
0.0000 [IU] | Freq: Every day | INTRAMUSCULAR | Status: DC
  Administered 2024-06-25 – 2024-06-27 (×3): 3 [IU] via SUBCUTANEOUS
  Filled 2024-06-25 (×3): qty 1

## 2024-06-25 MED ORDER — BISACODYL 10 MG RE SUPP
10.0000 mg | Freq: Every day | RECTAL | Status: DC | PRN
  Administered 2024-06-26: 10 mg via RECTAL
  Filled 2024-06-25: qty 1

## 2024-06-25 MED ORDER — INSULIN ASPART 100 UNIT/ML IJ SOLN
5.0000 [IU] | Freq: Three times a day (TID) | INTRAMUSCULAR | Status: DC
  Administered 2024-06-26 – 2024-06-28 (×6): 5 [IU] via SUBCUTANEOUS
  Filled 2024-06-25 (×7): qty 1

## 2024-06-25 MED ORDER — LACTULOSE 10 GM/15ML PO SOLN
20.0000 g | Freq: Two times a day (BID) | ORAL | Status: DC
  Filled 2024-06-25 (×4): qty 30

## 2024-06-25 MED ORDER — POLYETHYLENE GLYCOL 3350 17 G PO PACK
17.0000 g | PACK | Freq: Every day | ORAL | Status: DC
  Administered 2024-06-25 – 2024-06-26 (×2): 17 g via ORAL
  Filled 2024-06-25 (×2): qty 1

## 2024-06-25 MED ORDER — AZITHROMYCIN 250 MG PO TABS
500.0000 mg | ORAL_TABLET | Freq: Once | ORAL | Status: AC
  Administered 2024-06-26: 500 mg via ORAL
  Filled 2024-06-25: qty 2

## 2024-06-25 MED ORDER — DOXEPIN HCL 6 MG PO TABS: 6.0000 mg | ORAL_TABLET | Freq: Every day | ORAL | Status: DC

## 2024-06-25 MED ORDER — INSULIN ASPART 100 UNIT/ML IJ SOLN
0.0000 [IU] | Freq: Three times a day (TID) | INTRAMUSCULAR | Status: DC
  Administered 2024-06-26: 8 [IU] via SUBCUTANEOUS
  Administered 2024-06-27: 3 [IU] via SUBCUTANEOUS
  Administered 2024-06-27: 8 [IU] via SUBCUTANEOUS
  Administered 2024-06-28 (×2): 5 [IU] via SUBCUTANEOUS
  Administered 2024-06-28: 8 [IU] via SUBCUTANEOUS
  Administered 2024-06-29: 5 [IU] via SUBCUTANEOUS
  Administered 2024-06-29: 8 [IU] via SUBCUTANEOUS
  Administered 2024-06-30: 2 [IU] via SUBCUTANEOUS
  Administered 2024-06-30: 5 [IU] via SUBCUTANEOUS
  Filled 2024-06-25 (×10): qty 1

## 2024-06-25 MED ORDER — GERHARDT'S BUTT CREAM
TOPICAL_CREAM | Freq: Three times a day (TID) | CUTANEOUS | Status: DC
  Administered 2024-06-25: 1 via TOPICAL
  Filled 2024-06-25: qty 60

## 2024-06-25 NOTE — Progress Notes (Signed)
 Progress Note   Patient: Eugene [name] FMW:[id number] DOB: 02-25-1961 DOA: 06/22/2024     3 DOS: the patient was seen and examined on 06/25/2024   Brief hospital course: Mr. Yang Rack is a 63 year old male with history of morbid obesity, end-stage renal disease on hemodialysis, insulin -dependent diabetes mellitus 2, hypothyroid, hypertension admitted for sepsis due to pneumonia. Hospital course as below  Assessment and Plan: Severe sepsis with acute organ dysfunction (HCC) Secondary to pneumonia Acute on chronic hypoxic respiratory failure - Presented with WBC 19.2, tachycardia - Patient required BiPAP on arrival but have been weaned off to high flow - CT scan of the chest showing findings consolidation in bilateral lower lobes, small partially loculated effusion on the left - s/p left sided Thoracentesis by IR removed , WBC present, no organisms seen.  Cultures pending.  High LDH, low protein, partially exudative - Continue IV Ceftriaxone , Azithromycin  - Follow up blood cultures   Elevated troponin likely secondary to demand ischemia in the setting of sepsis and hypoxic respiratory failure - Had uptrending troponin, Denies chest pain - S/p Heparin  gtt for 48 hrs - Seen by Cardiology, appreciate recs - Patient had complete echo on 04/19/2024, with estimated ejection fraction of 55 to 60%   HTN (hypertension) Continue Cardizem  240 mg, carvedilol  6.25 mg bid   Type 2 diabetes mellitus with hyperglycemia, with long-term current use of insulin  (HCC) HbA1c 7.3 Home long-acting insulin , 12 units twice daily resumed, add meal time Novolog  5u TID SSI   ESRD on hemodialysis - Nephrology is consulted for dialysis needs - Has mild SOB, plan for HD tomorrow  Normocytic anemia - EPO per Nephrology  Chronic diastolic CHF - Echo in July showed EF 55 to 60%. - Resume Lasix  as per nephro, on dialysis  S/p bilateral BKA - No acute concerns  Suprapubic catheter - denies any  symptoms   Obesity, Class III, BMI 40-49.9 (morbid obesity) This complicates overall care and prognosis.    Hypothyroidism Levothyroxine  175 mcg daily resumed   Tobacco use disorder As needed nicotine  patch ordered   Chronic pain syndrome Continue home oxycodone  Add bowel regimen for constipation  Gluteal cleft pressure ulcer WOC consult   DVT prophylaxis: Heparin  SQ Code Status: DNR/DNI   Subjective:  Patient seen and examined at bedside Reports mild SOB, plan for hemodialysis by nephrology tomorrow  Physical Exam: Respiratory: clear to auscultation bilaterally, no wheezing, no crackles. Normal respiratory effort. No accessory muscle use.  Cardiovascular: Regular rate and rhythm, no murmurs / rubs / gallops.  Generalized extremity edema. 2+ pedal pulses. No carotid bruits.  Abdomen: Morbidly obese abdomen, no tenderness, no masses palpated, no hepatosplenomegaly. Bowel sounds positive.  Musculoskeletal: no clubbing / cyanosis. No joint deformity upper and lower extremities. Good ROM, no contractures, no atrophy. Normal muscle tone.  Skin: no rashes, lesions, ulcers. No induration Neurologic: Sensation intact. Strength 5/5 in all 4.  Psychiatric: Normal judgment and insight.     Vitals:   06/24/24 1700 06/24/24 2000 06/25/24 0400 06/25/24 0800  BP:  124/61 108/77 (!) 156/69  Pulse:  63 60 66  Resp:  13 15 20   Temp: 98 F (36.7 C) 98.1 F (36.7 C)  97.8 F (36.6 C)  TempSrc: Oral Oral  Oral  SpO2:  95% 96% 91%  Weight:      Height:        Data Reviewed:  I have reviewed patient's CT scan as shown above I have also reviewed below mentioned labs  Latest Ref Rng & Units 06/25/2024    5:00 AM 06/24/2024    5:05 AM 06/23/2024    3:47 AM  BMP  Glucose 70 - 99 mg/dL 716  633  626   BUN 8 - 23 mg/dL 41  65  51   Creatinine 0.61 - 1.24 mg/dL 6.48  4.87  5.34   Sodium 135 - 145 mmol/L 132  134  131   Potassium 3.5 - 5.1 mmol/L 3.7  5.1  4.7   Chloride 98 - 111  mmol/L 99  99  97   CO2 22 - 32 mmol/L 25  25  23    Calcium  8.9 - 10.3 mg/dL 7.3  7.4  7.5        Latest Ref Rng & Units 06/25/2024    5:00 AM 06/24/2024   10:10 AM 06/24/2024    5:05 AM  CBC  WBC 4.0 - 10.5 K/uL 8.9  12.8  9.6   Hemoglobin 13.0 - 17.0 g/dL 9.1  8.7  9.0   Hematocrit 39.0 - 52.0 % 29.3  27.7  29.7   Platelets 150 - 400 K/uL 186  205  201     Family Communication: No family present at bedside  Disposition: Pending clinical course  Author: Laree Lock, MD 06/25/2024 9:59 AM  For on call review [email or web address].

## 2024-06-25 NOTE — Progress Notes (Signed)
 [facility]  ROUNDING NOTE   Subjective:   Eugene [name] is a 63 y.o. male with past medical conditions including diabetes, bilateral BKA, suprapubic catheter, hepatitis C, and end-stage renal disease on hemodialysis.  Patient presented to the emergency department from nursing facility with altered mental status and respiratory distress.  Patient currently admitted to ICU for Anasarca [R60.1] Severe sepsis with acute organ dysfunction (HCC) [A41.9, R65.20] Acute on chronic respiratory failure with hypoxia (HCC) [J96.21] Altered mental status, unspecified altered mental status type [R41.82] Pneumonia due to infectious organism, unspecified laterality, unspecified part of lung [J18.9]  Patient is known to our practice and receives outpatient dialysis treatments at [facility] on a MWF schedule, supervised by Dr. Dennise.   Update:  Patient seen resting quietly Remains on 7 L high flow Continues to complain of intermittent shortness of breath Thoracentesis on 10/1 with 0.25 L of pleural fluid removal  Objective:  Vital signs in last 24 hours:  Temp:  [97.8 F (36.6 C)-98.5 F (36.9 C)] 98.5 F (36.9 C) (10/02 1200) Pulse Rate:  [60-66] 61 (10/02 1200) Resp:  [10-20] 10 (10/02 1200) BP: (108-156)/(61-77) 115/71 (10/02 1200) SpO2:  [91 %-96 %] 94 % (10/02 1200) FiO2 (%):  [40 %] 40 % (10/02 1010)  Weight change:  Filed Weights   06/22/24 1537  Weight: 136.1 kg    Intake/Output: I/O last 3 completed shifts: In: 1549.6 [P.O.:640; I.V.:549.8; IV Piggyback:359.8] Out: 3430 [Urine:830; Other:2600]   Intake/Output this shift:  Total I/O In: -  Out: 400 [Urine:400]  Physical Exam: General: NAD  Head: Normocephalic, atraumatic. Moist oral mucosal membranes  Eyes: Anicteric  Lungs:  Diminished, HFNC  Heart: Regular rate and rhythm  Abdomen:  Soft, nontender, obese  Extremities: 1-2+ peripheral edema.  Bilateral AKA  Neurologic: Awake, alert, conversant   Skin: Warm,dry, no rash  Access: Right IJ PermCath    Basic Metabolic Panel: Recent Labs  Lab 06/22/24 1553 06/23/24 0347 06/24/24 0505 06/25/24 0500  NA 133* 131* 134* 132*  K 5.4* 4.7 5.1 3.7  CL 99 97* 99 99  CO2 20* 23 25 25   GLUCOSE 335* 373* 366* 283*  BUN 63* 51* 65* 41*  CREATININE 6.07* 4.65* 5.12* 3.51*  CALCIUM  7.7* 7.5* 7.4* 7.3*  MG 2.5*  --   --   --   PHOS 7.1*  --   --   --     Liver Function Tests: Recent Labs  Lab 06/22/24 1553  AST 114*  ALT 102*  ALKPHOS 70  BILITOT 0.6  PROT 7.0  ALBUMIN  2.4*   No results for input(s): LIPASE, AMYLASE in the last 168 hours. No results for input(s): AMMONIA in the last 168 hours.  CBC: Recent Labs  Lab 06/22/24 1553 06/23/24 0347 06/24/24 0505 06/24/24 1010 06/25/24 0500  WBC 15.1* 19.2* 9.6 12.8* 8.9  NEUTROABS 11.4*  --   --   --   --   HGB 9.7* 9.1* 9.0* 8.7* 9.1*  HCT 31.7* 29.9* 29.7* 27.7* 29.3*  MCV 96.4 95.5 96.1 96.2 94.8  PLT 214 210 201 205 186    Cardiac Enzymes: No results for input(s): CKTOTAL, CKMB, CKMBINDEX, TROPONINI in the last 168 hours.  BNP: Invalid input(s): POCBNP  CBG: Recent Labs  Lab 06/24/24 1334 06/24/24 1644 06/24/24 2119 06/25/24 0741 06/25/24 1140  GLUCAP 158* 145* 240* 284* 335*    Microbiology: Results for orders placed or performed during the hospital encounter of 06/22/24  Culture, blood (routine x 2)  Status: None (Preliminary result)   Collection Time: 06/22/24  3:53 PM   Specimen: BLOOD  Result Value Ref Range Status   Specimen Description BLOOD BLOOD LEFT ARM  Final   Special Requests   Final    BOTTLES DRAWN AEROBIC AND ANAEROBIC Blood Culture adequate volume   Culture   Final    NO GROWTH 3 DAYS Performed at [facility], [address], [place], [place] [address]    Report Status PENDING  Incomplete  Resp panel by RT-PCR (RSV, Flu A&B, Covid) Anterior Nasal Swab     Status: None   Collection Time: 06/22/24   3:53 PM   Specimen: Anterior Nasal Swab  Result Value Ref Range Status   SARS Coronavirus 2 by RT PCR NEGATIVE NEGATIVE Final    Comment: (NOTE) SARS-CoV-2 target nucleic acids are NOT DETECTED.  The SARS-CoV-2 RNA is generally detectable in upper respiratory specimens during the acute phase of infection. The lowest concentration of SARS-CoV-2 viral copies this assay can detect is 138 copies/mL. A negative result does not preclude SARS-Cov-2 infection and should not be used as the sole basis for treatment or other patient management decisions. A negative result may occur with  improper specimen collection/handling, submission of specimen other than nasopharyngeal swab, presence of viral mutation(s) within the areas targeted by this assay, and inadequate number of viral copies(<138 copies/mL). A negative result must be combined with clinical observations, patient history, and epidemiological information. The expected result is Negative.  Fact Sheet for Patients:  BloggerCourse.com  Fact Sheet for Healthcare Providers:  SeriousBroker.it  This test is no t yet approved or cleared by the [place]  FDA and  has been authorized for detection and/or diagnosis of SARS-CoV-2 by FDA under an Emergency Use Authorization (EUA). This EUA will remain  in effect (meaning this test can be used) for the duration of the COVID-19 declaration under Section 564(b)(1) of the Act, 21 U.S.C.section 360bbb-3(b)(1), unless the authorization is terminated  or revoked sooner.       Influenza A by PCR NEGATIVE NEGATIVE Final   Influenza B by PCR NEGATIVE NEGATIVE Final    Comment: (NOTE) The Xpert Xpress SARS-CoV-2/FLU/RSV plus assay is intended as an aid in the diagnosis of influenza from Nasopharyngeal swab specimens and should not be used as a sole basis for treatment. Nasal washings and aspirates are unacceptable for Xpert Xpress  SARS-CoV-2/FLU/RSV testing.  Fact Sheet for Patients: BloggerCourse.com  Fact Sheet for Healthcare Providers: SeriousBroker.it  This test is not yet approved or cleared by the [place]  FDA and has been authorized for detection and/or diagnosis of SARS-CoV-2 by FDA under an Emergency Use Authorization (EUA). This EUA will remain in effect (meaning this test can be used) for the duration of the COVID-19 declaration under Section 564(b)(1) of the Act, 21 U.S.C. section 360bbb-3(b)(1), unless the authorization is terminated or revoked.     Resp Syncytial Virus by PCR NEGATIVE NEGATIVE Final    Comment: (NOTE) Fact Sheet for Patients: BloggerCourse.com  Fact Sheet for Healthcare Providers: SeriousBroker.it  This test is not yet approved or cleared by the [place]  FDA and has been authorized for detection and/or diagnosis of SARS-CoV-2 by FDA under an Emergency Use Authorization (EUA). This EUA will remain in effect (meaning this test can be used) for the duration of the COVID-19 declaration under Section 564(b)(1) of the Act, 21 U.S.C. section 360bbb-3(b)(1), unless the authorization is terminated or revoked.  Performed at [facility], [address]  [place], [place] [address]   Urine Culture     Status: Abnormal   Collection Time: 06/22/24  4:00 PM   Specimen: Urine, Suprapubic  Result Value Ref Range Status   Specimen Description   Final    URINE, SUPRAPUBIC Performed at [facility], [address] [place], [place] [address]    Special Requests   Final    NONE Performed at [facility], [address] [place], [place] [address]    Culture (A)  Final    >=100,000 COLONIES/mL ESCHERICHIA COLI >=100,000 COLONIES/mL PROVIDENCIA STUARTII Confirmed Extended Spectrum Beta-Lactamase Producer (ESBL).  In bloodstream infections from ESBL  organisms, carbapenems are preferred over piperacillin /tazobactam. They are shown to have a lower risk of mortality.    Report Status 06/25/2024 FINAL  Final   Organism ID, Bacteria PROVIDENCIA STUARTII (A)  Final   Organism ID, Bacteria ESCHERICHIA COLI (A)  Final      Susceptibility   Escherichia coli - MIC*    AMPICILLIN >=32 RESISTANT Resistant     CEFAZOLIN  (URINE) Value in next row Resistant      >=32 RESISTANTThis is a modified FDA-approved test that has been validated and its performance characteristics determined by the reporting laboratory.  This laboratory is certified under the Clinical Laboratory Improvement Amendments CLIA as qualified to perform high complexity clinical laboratory testing.    CEFEPIME  Value in next row Resistant      >=32 RESISTANTThis is a modified FDA-approved test that has been validated and its performance characteristics determined by the reporting laboratory.  This laboratory is certified under the Clinical Laboratory Improvement Amendments CLIA as qualified to perform high complexity clinical laboratory testing.    ERTAPENEM Value in next row Sensitive      >=32 RESISTANTThis is a modified FDA-approved test that has been validated and its performance characteristics determined by the reporting laboratory.  This laboratory is certified under the Clinical Laboratory Improvement Amendments CLIA as qualified to perform high complexity clinical laboratory testing.    CEFTRIAXONE  Value in next row Resistant      >=32 RESISTANTThis is a modified FDA-approved test that has been validated and its performance characteristics determined by the reporting laboratory.  This laboratory is certified under the Clinical Laboratory Improvement Amendments CLIA as qualified to perform high complexity clinical laboratory testing.    CIPROFLOXACIN  Value in next row Resistant      >=32 RESISTANTThis is a modified FDA-approved test that has been validated and its performance  characteristics determined by the reporting laboratory.  This laboratory is certified under the Clinical Laboratory Improvement Amendments CLIA as qualified to perform high complexity clinical laboratory testing.    GENTAMICIN Value in next row Sensitive      >=32 RESISTANTThis is a modified FDA-approved test that has been validated and its performance characteristics determined by the reporting laboratory.  This laboratory is certified under the Clinical Laboratory Improvement Amendments CLIA as qualified to perform high complexity clinical laboratory testing.    NITROFURANTOIN Value in next row Sensitive      >=32 RESISTANTThis is a modified FDA-approved test that has been validated and its performance characteristics determined by the reporting laboratory.  This laboratory is certified under the Clinical Laboratory Improvement Amendments CLIA as qualified to perform high complexity clinical laboratory testing.    TRIMETH /SULFA  Value in next row Sensitive      >=32 RESISTANTThis is a modified FDA-approved test that has been validated and its performance characteristics determined by the reporting laboratory.  This laboratory  is certified under the Clinical Laboratory Improvement Amendments CLIA as qualified to perform high complexity clinical laboratory testing.    AMPICILLIN/SULBACTAM Value in next row Resistant      >=32 RESISTANTThis is a modified FDA-approved test that has been validated and its performance characteristics determined by the reporting laboratory.  This laboratory is certified under the Clinical Laboratory Improvement Amendments CLIA as qualified to perform high complexity clinical laboratory testing.    PIP/TAZO Value in next row Sensitive      16 SENSITIVEThis is a modified FDA-approved test that has been validated and its performance characteristics determined by the reporting laboratory.  This laboratory is certified under the Clinical Laboratory Improvement Amendments CLIA as  qualified to perform high complexity clinical laboratory testing.    MEROPENEM Value in next row Sensitive      16 SENSITIVEThis is a modified FDA-approved test that has been validated and its performance characteristics determined by the reporting laboratory.  This laboratory is certified under the Clinical Laboratory Improvement Amendments CLIA as qualified to perform high complexity clinical laboratory testing.    * >=100,000 COLONIES/mL ESCHERICHIA COLI   Providencia stuartii - MIC*    AMPICILLIN Value in next row Resistant      16 SENSITIVEThis is a modified FDA-approved test that has been validated and its performance characteristics determined by the reporting laboratory.  This laboratory is certified under the Clinical Laboratory Improvement Amendments CLIA as qualified to perform high complexity clinical laboratory testing.    CEFEPIME  Value in next row Sensitive      16 SENSITIVEThis is a modified FDA-approved test that has been validated and its performance characteristics determined by the reporting laboratory.  This laboratory is certified under the Clinical Laboratory Improvement Amendments CLIA as qualified to perform high complexity clinical laboratory testing.    ERTAPENEM Value in next row Sensitive      16 SENSITIVEThis is a modified FDA-approved test that has been validated and its performance characteristics determined by the reporting laboratory.  This laboratory is certified under the Clinical Laboratory Improvement Amendments CLIA as qualified to perform high complexity clinical laboratory testing.    CEFTRIAXONE  Value in next row Sensitive      16 SENSITIVEThis is a modified FDA-approved test that has been validated and its performance characteristics determined by the reporting laboratory.  This laboratory is certified under the Clinical Laboratory Improvement Amendments CLIA as qualified to perform high complexity clinical laboratory testing.    CIPROFLOXACIN  Value in next row  Resistant      16 SENSITIVEThis is a modified FDA-approved test that has been validated and its performance characteristics determined by the reporting laboratory.  This laboratory is certified under the Clinical Laboratory Improvement Amendments CLIA as qualified to perform high complexity clinical laboratory testing.    GENTAMICIN Value in next row Resistant      16 SENSITIVEThis is a modified FDA-approved test that has been validated and its performance characteristics determined by the reporting laboratory.  This laboratory is certified under the Clinical Laboratory Improvement Amendments CLIA as qualified to perform high complexity clinical laboratory testing.    NITROFURANTOIN Value in next row Resistant      16 SENSITIVEThis is a modified FDA-approved test that has been validated and its performance characteristics determined by the reporting laboratory.  This laboratory is certified under the Clinical Laboratory Improvement Amendments CLIA as qualified to perform high complexity clinical laboratory testing.    TRIMETH /SULFA  Value in next row Sensitive      16 SENSITIVEThis  is a modified FDA-approved test that has been validated and its performance characteristics determined by the reporting laboratory.  This laboratory is certified under the Clinical Laboratory Improvement Amendments CLIA as qualified to perform high complexity clinical laboratory testing.    AMPICILLIN/SULBACTAM Value in next row Intermediate      16 SENSITIVEThis is a modified FDA-approved test that has been validated and its performance characteristics determined by the reporting laboratory.  This laboratory is certified under the Clinical Laboratory Improvement Amendments CLIA as qualified to perform high complexity clinical laboratory testing.    PIP/TAZO Value in next row Sensitive      <=4 SENSITIVEThis is a modified FDA-approved test that has been validated and its performance characteristics determined by the reporting  laboratory.  This laboratory is certified under the Clinical Laboratory Improvement Amendments CLIA as qualified to perform high complexity clinical laboratory testing.    MEROPENEM Value in next row Sensitive      <=4 SENSITIVEThis is a modified FDA-approved test that has been validated and its performance characteristics determined by the reporting laboratory.  This laboratory is certified under the Clinical Laboratory Improvement Amendments CLIA as qualified to perform high complexity clinical laboratory testing.    * >=100,000 COLONIES/mL PROVIDENCIA STUARTII  Culture, blood (routine x 2)     Status: None (Preliminary result)   Collection Time: 06/23/24 11:23 AM   Specimen: BLOOD  Result Value Ref Range Status   Specimen Description BLOOD BLOOD RIGHT HAND  Final   Special Requests   Final    BOTTLES DRAWN AEROBIC AND ANAEROBIC Blood Culture adequate volume   Culture   Final    NO GROWTH 2 DAYS Performed at [facility], [address], [place], [place] [address]    Report Status PENDING  Incomplete  Body fluid culture w Gram Stain     Status: None (Preliminary result)   Collection Time: 06/24/24  3:55 PM   Specimen: PATH Cytology Pleural fluid  Result Value Ref Range Status   Specimen Description   Final    PLEURAL Performed at [facility], [address] [place], [place] [address]    Special Requests   Final    NONE Performed at [facility], [address], [place], [place] [address]    Gram Stain   Final    WBC PRESENT,BOTH PMN AND MONONUCLEAR NO ORGANISMS SEEN CYTOSPIN SMEAR    Culture   Final    NO GROWTH < 24 HOURS Performed at [facility], [address] [place], [place] [address]    Report Status PENDING  Incomplete    Coagulation Studies: Recent Labs    06/22/24 1944  LABPROT 14.5  INR 1.1    Urinalysis: Recent Labs    06/22/24 1600  COLORURINE YELLOW*  LABSPEC 1.027  PHURINE 6.0  GLUCOSEU 50*  HGBUR NEGATIVE   BILIRUBINUR NEGATIVE  KETONESUR NEGATIVE  PROTEINUR 100*  NITRITE NEGATIVE  LEUKOCYTESUR MODERATE*      Imaging: US  THORACENTESIS ASP PLEURAL SPACE W/IMG GUIDE Result Date: 06/25/2024 INDICATION: 63 year old male with history of ESRD on HD, presenting with shortness of breath and altered mental status, noted with recurrent bilateral pleural effusions. IR was requested for diagnostic and therapeutic thoracentesis. EXAM: ULTRASOUND GUIDED DIAGNOSTIC AND THERAPEUTIC LEFT-SIDED THORACENTESIS MEDICATIONS: 7 cc of 1% lidocaine . COMPLICATIONS: None immediate. PROCEDURE: An ultrasound guided thoracentesis was thoroughly discussed with the patient and questions answered. The benefits, risks, alternatives and complications were also discussed. The patient understands and wishes to  proceed with the procedure. Written consent was obtained. Ultrasound was performed to localize and mark an adequate pocket of fluid in the left chest. The area was then prepped and draped in the normal sterile fashion. 1% Lidocaine  was used for local anesthesia. Under ultrasound guidance a 6 Fr Safe-T-Centesis catheter was introduced. Thoracentesis was performed. The catheter was removed and a dressing applied. FINDINGS: A total of approximately 250 cc of clear, straw-colored pleural fluid was removed. Samples were sent to the laboratory as requested by the clinical team. IMPRESSION: Successful ultrasound guided left thoracentesis yielding 250 cc of pleural fluid. Procedure performed by Carlin Griffon, PA-C Electronically Signed   By: JONETTA Faes M.D.   On: 06/25/2024 07:13   DG Chest Port 1 View Result Date: 06/24/2024 CLINICAL DATA:  Status post left thoracentesis. EXAM: PORTABLE CHEST 1 VIEW COMPARISON:  06/22/2024 FINDINGS: No pneumothorax. Stable enlarged cardiac silhouette and bibasilar atelectasis. Small bilateral pleural effusions, mildly decreased on the left. Stable right jugular double-lumen catheter with the lumen tips in  the superior vena cava and region of the superior cavoatrial junction. Stable thoracic spine fixation hardware. IMPRESSION: 1. No pneumothorax following left thoracentesis. 2. Small bilateral pleural effusions, mildly decreased on the left. 3. Stable bibasilar atelectasis. Electronically Signed   By: Elspeth Bathe M.D.   On: 06/24/2024 16:43     Medications:    cefTRIAXone  (ROCEPHIN )  IV 2 g (06/25/24 0943)    [START ON 06/26/2024] azithromycin   500 mg Oral Once   carvedilol   6.25 mg Oral BID WC   Chlorhexidine  Gluconate Cloth  6 each Topical Q0600   diltiazem   240 mg Oral QPM   epoetin  alfa-epbx (RETACRIT ) injection  4,000 Units Intravenous Q M,W,F-1800   famotidine   10 mg Oral Daily   insulin  aspart  0-20 Units Subcutaneous TID AC & HS   insulin  glargine  12 Units Subcutaneous BID   levothyroxine   175 mcg Oral Q0600   linaclotide   145 mcg Oral QAC breakfast   melatonin  10 mg Oral QHS   acetaminophen  **OR** acetaminophen , hydrALAZINE , nicotine , ondansetron  **OR** ondansetron  (ZOFRAN ) IV, oxyCODONE , oxyCODONE , senna-docusate  Assessment/ Plan:  Eugene [name] is a 63 y.o.  male with past medical conditions including diabetes, bilateral BKA, suprapubic catheter, hepatitis C, and end-stage renal disease on hemodialysis.  Patient presented to the emergency department from nursing facility with altered mental status and respiratory distress.  Patient currently admitted to ICU for Anasarca [R60.1] Severe sepsis with acute organ dysfunction (HCC) [A41.9, R65.20] Acute on chronic respiratory failure with hypoxia (HCC) [J96.21] Altered mental status, unspecified altered mental status type [R41.82] Pneumonia due to infectious organism, unspecified laterality, unspecified part of lung [J18.9]  [facility]/MWF/right PermCath  End-stage renal disease with hyperkalemia on hemodialysis.  Last outpatient treatment received on Monday.  Potassium corrected. Achieved 2L fluid removal with  dialysis yesterday. Next treatment scheduled for Friday.   2.  Acute respiratory failure, patient placed on BiPAP on ED arrival.  Remains on 7L [place]. Thoracentesis on 10/1 with 0.25L fluid removal. Will attempt additional fluid removal with dialysis.   3. Anemia of chronic kidney disease Lab Results  Component Value Date   HGB 9.1 (L) 06/25/2024    Hemoglobin borderline, will order low dose Retacrit  4000 unit with dialysis  4. Diabetes mellitus type II with chronic kidney disease/renal manifestations: insulin  dependent. Home regimen includes glargine and lispro. Most recent hemoglobin A1c is 6.0 on 04/14/2024.   Primary team to continue management.  LOS: 3 Eugene [name] 10/2/20251:37 PM

## 2024-06-25 NOTE — Progress Notes (Signed)
 PHARMACIST - PHYSICIAN COMMUNICATION  CONCERNING: Antibiotic IV to Oral Route Change Policy  RECOMMENDATION: This patient is receiving Azithromycin  500 mg q24h by the intravenous route.  Based on criteria approved by the Pharmacy and Therapeutics Committee, the antibiotic(s) is/are being converted to the equivalent oral dose form(s).  DESCRIPTION: These criteria include: Patient being treated for a respiratory tract infection, urinary tract infection, cellulitis or clostridium difficile associated diarrhea if on metronidazole  The patient is not neutropenic and does not exhibit a GI malabsorption state The patient is eating (either orally or via tube) and/or has been taking other orally administered medications for a least 24 hours The patient is improving clinically and has a Tmax < 100.5  If you have questions about this conversion, please contact the Pharmacy Department  []   [phone] )  [facility] [x]   [phone] )  [facility] []   [phone] )  [facility] []   [phone] )  [facility] []   [phone] )  [facility]    Thank you for involving pharmacy in this patient's care.    Ransom Blanch PGY-1 Pharmacy Resident   - [facility]  06/25/2024 11:15 AM

## 2024-06-25 NOTE — Plan of Care (Signed)
  Problem: Skin Integrity: Goal: Risk for impaired skin integrity will decrease Outcome: Progressing   Problem: Clinical Measurements: Goal: Ability to maintain clinical measurements within normal limits will improve Outcome: Progressing Goal: Will remain free from infection Outcome: Progressing Goal: Diagnostic test results will improve Outcome: Progressing Goal: Cardiovascular complication will be avoided Outcome: Progressing   Problem: Fluid Volume: Goal: Ability to maintain a balanced intake and output will improve Outcome: Not Progressing   Problem: Health Behavior/Discharge Planning: Goal: Ability to manage health-related needs will improve Outcome: Not Progressing   Problem: Metabolic: Goal: Ability to maintain appropriate glucose levels will improve Outcome: Not Progressing   Problem: Nutritional: Goal: Maintenance of adequate nutrition will improve Outcome: Not Progressing Goal: Progress toward achieving an optimal weight will improve Outcome: Not Progressing   Problem: Education: Goal: Knowledge of General Education information will improve Description: Including pain rating scale, medication(s)/side effects and non-pharmacologic comfort measures Outcome: Not Progressing   Problem: Health Behavior/Discharge Planning: Goal: Ability to manage health-related needs will improve Outcome: Not Progressing   Problem: Clinical Measurements: Goal: Respiratory complications will improve Outcome: Not Progressing   Problem: Activity: Goal: Risk for activity intolerance will decrease Outcome: Not Progressing   Problem: Nutrition: Goal: Adequate nutrition will be maintained Outcome: Not Progressing   Problem: Elimination: Goal: Will not experience complications related to bowel motility Outcome: Not Progressing

## 2024-06-25 NOTE — Progress Notes (Signed)
 Pt to be transferred to 2A-252. Report called to [facility]. All questions answered. VSS at this time.

## 2024-06-25 NOTE — Consult Note (Addendum)
 WOC Nurse Consult Note: Reason for Consult: wound gluteal cleft  Wound type: 1.  Moisture Associated Skin Damage coccyx  2. Stage 2 Pressure Injury B medial buttocks  Pressure Injury POA: no  Measurement: see nursing flowsheet  Wound bed: widespread erythema with scattered partial thickness skin loss and peeling skin; red dry Stage 2 medial buttocks  Drainage (amount, consistency, odor) see nursing flowsheet  Periwound: as above  Dressing procedure/placement/frequency: Cleanse coccyx/buttocks with soap and water, dry and apply Gerhardt's Butt Cream 3 times a day and prn soiling.  Cover Stage 2 buttocks with silicone foam, lift daily to assess.    POC discussed with bedside nurse. WOC team will not follow. Re-consult if further needs arise.   Thank you,    Powell Bar MSN, RN-BC, [employer]

## 2024-06-25 NOTE — Plan of Care (Signed)
  Problem: Coping: Goal: Ability to adjust to condition or change in health will improve Outcome: Progressing   Problem: Health Behavior/Discharge Planning: Goal: Ability to manage health-related needs will improve Outcome: Progressing   Problem: Nutritional: Goal: Maintenance of adequate nutrition will improve Outcome: Progressing   Problem: Tissue Perfusion: Goal: Adequacy of tissue perfusion will improve Outcome: Progressing   Problem: Education: Goal: Knowledge of General Education information will improve Description: Including pain rating scale, medication(s)/side effects and non-pharmacologic comfort measures Outcome: Progressing

## 2024-06-25 NOTE — Progress Notes (Addendum)
 Patient admitted to PCU from ICU. Upon arrival, patient placed on continuous cardiac telemetry monitoring. Vital signs obtained and documented. Patient oriented to room, call bell system, and unit routines. Safety measures implemented: bed in lowest position, call bell in reach, and side rails up x3. Patient repositioned for comfort and skin protection. Patient resting comfortably in bed, no acute distress noted at this time.

## 2024-06-26 ENCOUNTER — Inpatient Hospital Stay

## 2024-06-26 DIAGNOSIS — R6520 Severe sepsis without septic shock: Secondary | ICD-10-CM | POA: Diagnosis not present

## 2024-06-26 DIAGNOSIS — A419 Sepsis, unspecified organism: Secondary | ICD-10-CM | POA: Diagnosis not present

## 2024-06-26 LAB — CBC
HCT: 29.7 % — ABNORMAL LOW (ref 39.0–52.0)
Hemoglobin: 9.3 g/dL — ABNORMAL LOW (ref 13.0–17.0)
MCH: 30 pg (ref 26.0–34.0)
MCHC: 31.3 g/dL (ref 30.0–36.0)
MCV: 95.8 fL (ref 80.0–100.0)
Platelets: 179 K/uL (ref 150–400)
RBC: 3.1 MIL/uL — ABNORMAL LOW (ref 4.22–5.81)
RDW: 14.6 % (ref 11.5–15.5)
WBC: 5.6 K/uL (ref 4.0–10.5)
nRBC: 0 % (ref 0.0–0.2)

## 2024-06-26 LAB — BASIC METABOLIC PANEL WITH GFR
Anion gap: 9 (ref 5–15)
BUN: 42 mg/dL — ABNORMAL HIGH (ref 8–23)
CO2: 25 mmol/L (ref 22–32)
Calcium: 7.4 mg/dL — ABNORMAL LOW (ref 8.9–10.3)
Chloride: 100 mmol/L (ref 98–111)
Creatinine, Ser: 3.52 mg/dL — ABNORMAL HIGH (ref 0.61–1.24)
GFR, Estimated: 19 mL/min — ABNORMAL LOW (ref >60)
Glucose, Bld: 231 mg/dL — ABNORMAL HIGH (ref 70–99)
Potassium: 4.5 mmol/L (ref 3.5–5.1)
Sodium: 134 mmol/L — ABNORMAL LOW (ref 135–145)

## 2024-06-26 LAB — GLUCOSE, CAPILLARY
Glucose-Capillary: 192 mg/dL — ABNORMAL HIGH (ref 70–99)
Glucose-Capillary: 187 mg/dL — ABNORMAL HIGH (ref 70–99)
Glucose-Capillary: 259 mg/dL — ABNORMAL HIGH (ref 70–99)
Glucose-Capillary: 271 mg/dL — ABNORMAL HIGH (ref 70–99)

## 2024-06-26 MED ORDER — ALTEPLASE 2 MG IJ SOLR
2.0000 mg | Freq: Once | INTRAMUSCULAR | Status: AC | PRN
  Administered 2024-06-26: 2 mg

## 2024-06-26 MED ORDER — FUROSEMIDE 40 MG PO TABS
80.0000 mg | ORAL_TABLET | Freq: Every day | ORAL | Status: DC
  Administered 2024-06-26 – 2024-06-30 (×5): 80 mg via ORAL
  Filled 2024-06-26 (×5): qty 2

## 2024-06-26 MED ORDER — POLYETHYLENE GLYCOL 3350 17 G PO PACK
17.0000 g | PACK | Freq: Every day | ORAL | Status: DC | PRN
  Administered 2024-06-26: 17 g via ORAL
  Filled 2024-06-26: qty 1

## 2024-06-26 MED ORDER — EPOETIN ALFA-EPBX 4000 UNIT/ML IJ SOLN
INTRAMUSCULAR | Status: AC
  Filled 2024-06-26: qty 1

## 2024-06-26 MED ORDER — STERILE WATER FOR INJECTION IJ SOLN
INTRAMUSCULAR | Status: AC
  Filled 2024-06-26: qty 10

## 2024-06-26 MED ORDER — ALTEPLASE 2 MG IJ SOLR
INTRAMUSCULAR | Status: AC
  Filled 2024-06-26: qty 4

## 2024-06-26 MED ORDER — ONDANSETRON HCL 4 MG/2ML IJ SOLN
INTRAMUSCULAR | Status: AC
  Filled 2024-06-26: qty 2

## 2024-06-26 MED ORDER — HEPARIN SODIUM (PORCINE) 1000 UNIT/ML IJ SOLN
INTRAMUSCULAR | Status: AC
  Filled 2024-06-26: qty 4

## 2024-06-26 MED ORDER — HEPARIN SODIUM (PORCINE) 1000 UNIT/ML DIALYSIS: 1000.0000 [IU] | INTRAMUSCULAR | Status: DC | PRN

## 2024-06-26 MED ORDER — HEPARIN SODIUM (PORCINE) 1000 UNIT/ML DIALYSIS: 25.0000 [IU]/kg | INTRAMUSCULAR | Status: DC | PRN

## 2024-06-26 NOTE — Progress Notes (Signed)
 Progress Note   Patient: Eugene [name] FMW:[id number] DOB: 05/12/1961 DOA: 06/22/2024     4 DOS: the patient was seen and examined on 06/26/2024   Brief hospital course: Mr. Zane Pellecchia is a 63 year old male with history of morbid obesity, end-stage renal disease on hemodialysis, insulin -dependent diabetes mellitus 2, hypothyroid, hypertension admitted for sepsis due to pneumonia. Hospital course as below  Assessment and Plan: Severe sepsis with acute organ dysfunction (HCC) Secondary to pneumonia Acute on chronic hypoxic respiratory failure - Presented with WBC 19.2, tachycardia - Patient required BiPAP on arrival but have been weaned off to high flow - CT scan of the chest showing findings consolidation in bilateral lower lobes, small partially loculated effusion on the left - s/p left sided Thoracentesis by IR removed , WBC present, no organisms seen.  Cultures NG.  High LDH, low protein, partially exudative - Continue IV Ceftriaxone , Azithromycin  - Follow up blood cultures - Wean oxygen as able   Elevated troponin likely secondary to demand ischemia in the setting of sepsis and hypoxic respiratory failure - Had uptrending troponin, Denies chest pain - S/p Heparin  gtt for 48 hrs - Seen by Cardiology, appreciate recs - Patient had complete echo on 04/19/2024, with estimated ejection fraction of 55 to 60%   HTN (hypertension) Continue Cardizem  240 mg, carvedilol  6.25 mg bid   Type 2 diabetes mellitus with hyperglycemia, with long-term current use of insulin  (HCC) HbA1c 7.3 Home long-acting insulin , 12 units twice daily resumed, meal time Novolog  5u TID SSI   ESRD on hemodialysis - Nephrology is consulted for dialysis needs - Underwent HD today, next treatment Monday (MWF)  Normocytic anemia - EPO per Nephrology  Chronic diastolic CHF - Echo in July showed EF 55 to 60%. - Resume Lasix  10/03  S/p bilateral BKA - No acute concerns  Suprapubic catheter - denies  any symptoms   Obesity, Class III, BMI 40-49.9 (morbid obesity) This complicates overall care and prognosis.    Hypothyroidism Levothyroxine  175 mcg daily resumed   Tobacco use disorder As needed nicotine  patch ordered   Chronic pain syndrome Continue home oxycodone  Add bowel regimen for constipation  Gluteal cleft pressure ulcer WOC consult   DVT prophylaxis: Heparin  SQ Code Status: DNR/DNI   Subjective:  Patient seen and examined at bedside Reports mild SOB when laying flat, underwent HD and resumed lasix . Pending CXR  Physical Exam: Respiratory: clear to auscultation bilaterally, no wheezing, no crackles. Normal respiratory effort. No accessory muscle use.  Cardiovascular: Regular rate and rhythm, no murmurs / rubs / gallops.  Generalized extremity edema. 2+ pedal pulses. No carotid bruits.  Abdomen: Morbidly obese abdomen, no tenderness, no masses palpated, no hepatosplenomegaly. Bowel sounds positive.  Musculoskeletal: no clubbing / cyanosis. No joint deformity upper and lower extremities. Good ROM, no contractures, no atrophy. Normal muscle tone.  Skin: no rashes, lesions, ulcers. No induration Neurologic: Sensation intact. Strength 5/5 in all 4.  Psychiatric: Normal judgment and insight.     Vitals:   06/26/24 1200 06/26/24 1308 06/26/24 1408 06/26/24 1619  BP: (!) 122/58 135/70 (!) 155/85 (!) 166/74  Pulse: 64 66 72 69  Resp: 14 14    Temp:  98.5 F (36.9 C) 97.8 F (36.6 C)   TempSrc:  Oral Oral   SpO2: 97% 96% 97% 97%  Weight:  127 kg    Height:        Data Reviewed:  I have reviewed patient's CT scan as shown above I have also reviewed below mentioned  labs    Latest Ref Rng & Units 06/26/2024    4:20 AM 06/25/2024    5:00 AM 06/24/2024    5:05 AM  BMP  Glucose 70 - 99 mg/dL 768  716  633   BUN 8 - 23 mg/dL 42  41  65   Creatinine 0.61 - 1.24 mg/dL 6.47  6.48  4.87   Sodium 135 - 145 mmol/L 134  132  134   Potassium 3.5 - 5.1 mmol/L 4.5  3.7   5.1   Chloride 98 - 111 mmol/L 100  99  99   CO2 22 - 32 mmol/L 25  25  25    Calcium  8.9 - 10.3 mg/dL 7.4  7.3  7.4        Latest Ref Rng & Units 06/26/2024    8:45 AM 06/25/2024    5:00 AM 06/24/2024   10:10 AM  CBC  WBC 4.0 - 10.5 K/uL 5.6  8.9  12.8   Hemoglobin 13.0 - 17.0 g/dL 9.3  9.1  8.7   Hematocrit 39.0 - 52.0 % 29.7  29.3  27.7   Platelets 150 - 400 K/uL 179  186  205     Family Communication: No family present at bedside  Disposition: Pending clinical course  Author: Laree Lock, MD 06/26/2024 5:10 PM  For on call review [email or web address].

## 2024-06-26 NOTE — TOC Initial Note (Signed)
 Transition of Care [facility]) - Initial/Assessment Note    Patient Details  Name: Eugene [name] MRN: [id number] Date of Birth: 05-May-1961  [facility] CM/SW Contact:    Lauraine JAYSON Carpen, LCSW Phone Number: 06/26/2024, 4:44 PM  Clinical Narrative:  CSW met with patient. No family at bedside. CSW introduced role and explained that discharge planning would be discussed. Patient confirmed he is a long-term resident at [facility]. Admissions coordinator confirmed as well. SNF is aware he might discharge tomorrow. Per MD, he will not need bipap at discharge. No further concerns. CSW will continue to follow patient for support and facilitate return to SNF once medically stable.                Expected Discharge Plan: Skilled Nursing Facility Barriers to Discharge: Continued Medical Work up   Patient Goals and CMS Choice            Expected Discharge Plan and Services In-house Referral: Clinical Social Work Discharge Planning Services: CM Consult Post Acute Care Choice: Resumption of Svcs/PTA Provider Living arrangements for the past 2 months: Skilled Nursing Facility                                      Prior Living Arrangements/Services Living arrangements for the past 2 months: Skilled Nursing Facility Lives with:: Facility Resident Patient language and need for interpreter reviewed:: Yes Do you feel safe going back to the place where you live?: Yes      Need for Family Participation in Patient Care: Yes (Comment) Care giver support system in place?: Yes (comment)   Criminal Activity/Legal Involvement Pertinent to Current Situation/Hospitalization: No - Comment as needed  Activities of Daily Living   ADL Screening (condition at time of admission) Independently performs ADLs?: No Is the patient deaf or have difficulty hearing?: No Does the patient have difficulty seeing, even when wearing glasses/contacts?: No Does the patient have difficulty  concentrating, remembering, or making decisions?: No  Permission Sought/Granted Permission sought to share information with : Facility Industrial/product designer granted to share information with : Yes, Verbal Permission Granted     Permission granted to share info w AGENCY: [facility]        Emotional Assessment Appearance:: Appears stated age Attitude/Demeanor/Rapport: Engaged, Gracious Affect (typically observed): Accepting, Appropriate, Calm, Pleasant Orientation: : Oriented to Self, Oriented to Place, Oriented to  Time, Oriented to Situation Alcohol / Substance Use: Not Applicable Psych Involvement: No (comment)  Admission diagnosis:  Anasarca [R60.1] Severe sepsis with acute organ dysfunction (HCC) [A41.9, R65.20] Acute on chronic respiratory failure with hypoxia (HCC) [J96.21] Altered mental status, unspecified altered mental status type [R41.82] Pneumonia due to infectious organism, unspecified laterality, unspecified part of lung [J18.9] Patient Active Problem List   Diagnosis Date Noted   Severe sepsis with acute organ dysfunction (HCC) 06/22/2024   Obesity, Class III, BMI 40-49.9 (morbid obesity) (HCC) 06/22/2024   Elevated troponin 06/22/2024   SVT (supraventricular tachycardia) 05/25/2024   ESRD (end stage renal disease) (HCC) 05/25/2024   Hypoxia 05/25/2024   Thrombocytopenia 05/24/2024   Class 3 obesity (HCC) 05/22/2024   Bronchospasm 05/22/2024   Anasarca associated with disorder of kidney 05/09/2024   Type 2 diabetes mellitus with peripheral neuropathy (HCC) 05/03/2024   Essential hypertension 05/03/2024   Hypothyroidism 05/03/2024   IBS (irritable bowel syndrome) 05/03/2024   Acute on chronic diastolic CHF (  congestive heart failure) (HCC) 05/03/2024   CKD (chronic kidney disease) stage 4, GFR 15-29 ml/min (HCC) 05/03/2024   Anaphylaxis 05/02/2024   Ileus (HCC) 04/23/2024   Acute respiratory failure with hypoxia (HCC) 04/19/2024    Acid-base disorder, mixed 04/16/2024   Acute kidney injury superimposed on CKD 04/15/2024   Diabetic gastroparesis (HCC) 04/17/2021   Suprapubic catheter dysfunction    Pain    Constipation by delayed colonic transit    Nausea and vomiting 10/21/2019   Suprapubic catheter (HCC)    Sepsis (HCC) 10/02/2019   Thoracic spinal stenosis 02/12/2019   Osteomyelitis of thoracic spine (HCC) 02/08/2019   Peripheral vascular disease 02/08/2019   HTN (hypertension) 02/08/2019   Dyslipidemia 02/08/2019   Chronic pain syndrome 02/08/2019   Polysubstance abuse (HCC) 02/08/2019   Chronic hepatitis C without hepatic coma (HCC) 02/08/2019   GERD (gastroesophageal reflux disease) 02/08/2019   Cigarette smoker 02/08/2019   Normocytic anemia 02/08/2019   Catheter-associated urinary tract infection 02/07/2019   AKI (acute kidney injury) 02/07/2019   Hyperkalemia 02/07/2019   Pressure injury of skin 02/07/2019   Hyperlipidemia 11/07/2018   Drug-seeking behavior 11/07/2018   Abnormality of gait 10/17/2018   S/P bilateral BKA (below knee amputation) (HCC) 10/17/2018   Abdominal pain 03/24/2018   Acute pain of left knee 01/23/2018   MRSA (methicillin resistant Staphylococcus aureus) septicemia (HCC) 08/17/2017   Bacteremia due to Staphylococcus aureus 08/15/2017   Other acute osteomyelitis, left ankle and foot (HCC) 06/29/2017   Diabetic ulcer of right foot associated with diabetes mellitus due to underlying condition (HCC) 06/27/2017   Cellulitis of left foot 06/27/2017   Tobacco use disorder 06/27/2017   Type 2 diabetes mellitus with hyperglycemia, with long-term current use of insulin  (HCC) 06/18/2017   PCP:  Care, [facility]:   Lorine augusto [place] [place], [place] - [address] [place] [place] [address] Phone: [phone] Fax: [phone]     Social Drivers of Health (SDOH) Social History: SDOH Screenings   Food Insecurity: Patient Declined  (06/23/2024)  Housing: Low Risk  (06/23/2024)  Transportation Needs: No Transportation Needs (06/23/2024)  Utilities: Not At Risk (06/23/2024)  Financial Resource Strain: Medium Risk (02/02/2019)  Physical Activity: Unknown (02/02/2019)  Social Connections: Somewhat Isolated (02/02/2019)  Stress: Stress Concern Present (02/02/2019)  Tobacco Use: High Risk (06/22/2024)   SDOH Interventions:     Readmission Risk Interventions    05/05/2024    3:05 PM  Readmission Risk Prevention Plan  Transportation Screening Complete  Medication Review (RN Care Manager) Complete  PCP or Specialist appointment within 3-5 days of discharge Complete  SW Recovery Care/Counseling Consult Complete  Palliative Care Screening Not Applicable  Skilled Nursing Facility Complete

## 2024-06-26 NOTE — NC FL2 (Signed)
 [place]  MEDICAID FL2 LEVEL OF CARE FORM     IDENTIFICATION  Patient Name: Eugene [name] Birthdate: 11-Sep-1961 Sex: male Admission Date (Current Location): 06/22/2024  [address] [facility] and [place] Number:  Chiropodist and Address:  [facility], [address] [place], [place] [address]      Provider Number: [id number]  Attending Physician Name and Address:  Jerelene Critchley, MD  Relative Name and Phone Number:       Current Level of Care: Hospital Recommended Level of Care: Skilled Nursing Facility Prior Approval Number:    Date Approved/Denied:   PASRR Number: [id number]  Discharge Plan: SNF    Current Diagnoses: Patient Active Problem List   Diagnosis Date Noted   Severe sepsis with acute organ dysfunction (HCC) 06/22/2024   Obesity, Class III, BMI 40-49.9 (morbid obesity) (HCC) 06/22/2024   Elevated troponin 06/22/2024   SVT (supraventricular tachycardia) 05/25/2024   ESRD (end stage renal disease) (HCC) 05/25/2024   Hypoxia 05/25/2024   Thrombocytopenia 05/24/2024   Class 3 obesity (HCC) 05/22/2024   Bronchospasm 05/22/2024   Anasarca associated with disorder of kidney 05/09/2024   Type 2 diabetes mellitus with peripheral neuropathy (HCC) 05/03/2024   Essential hypertension 05/03/2024   Hypothyroidism 05/03/2024   IBS (irritable bowel syndrome) 05/03/2024   Acute on chronic diastolic CHF (congestive heart failure) (HCC) 05/03/2024   CKD (chronic kidney disease) stage 4, GFR 15-29 ml/min (HCC) 05/03/2024   Anaphylaxis 05/02/2024   Ileus (HCC) 04/23/2024   Acute respiratory failure with hypoxia (HCC) 04/19/2024   Acid-base disorder, mixed 04/16/2024   Acute kidney injury superimposed on CKD 04/15/2024   Diabetic gastroparesis (HCC) 04/17/2021   Suprapubic catheter dysfunction    Pain    Constipation by delayed colonic transit    Nausea and vomiting 10/21/2019   Suprapubic catheter (HCC)    Sepsis (HCC) 10/02/2019   Thoracic  spinal stenosis 02/12/2019   Osteomyelitis of thoracic spine (HCC) 02/08/2019   Peripheral vascular disease 02/08/2019   HTN (hypertension) 02/08/2019   Dyslipidemia 02/08/2019   Chronic pain syndrome 02/08/2019   Polysubstance abuse (HCC) 02/08/2019   Chronic hepatitis C without hepatic coma (HCC) 02/08/2019   GERD (gastroesophageal reflux disease) 02/08/2019   Cigarette smoker 02/08/2019   Normocytic anemia 02/08/2019   Catheter-associated urinary tract infection 02/07/2019   AKI (acute kidney injury) 02/07/2019   Hyperkalemia 02/07/2019   Pressure injury of skin 02/07/2019   Hyperlipidemia 11/07/2018   Drug-seeking behavior 11/07/2018   Abnormality of gait 10/17/2018   S/P bilateral BKA (below knee amputation) (HCC) 10/17/2018   Abdominal pain 03/24/2018   Acute pain of left knee 01/23/2018   MRSA (methicillin resistant Staphylococcus aureus) septicemia (HCC) 08/17/2017   Bacteremia due to Staphylococcus aureus 08/15/2017   Other acute osteomyelitis, left ankle and foot (HCC) 06/29/2017   Diabetic ulcer of right foot associated with diabetes mellitus due to underlying condition (HCC) 06/27/2017   Cellulitis of left foot 06/27/2017   Tobacco use disorder 06/27/2017   Type 2 diabetes mellitus with hyperglycemia, with long-term current use of insulin  (HCC) 06/18/2017    Orientation RESPIRATION BLADDER Height & Weight     Self, Time, Situation, Place  O2, Other (Comment) (Nasal Cannula 7 L. Bipap QHS 10/5 at 40%.) Indwelling catheter Weight: 279 lb 15.8 oz (127 kg) Height:  5' 10 (177.8 cm)  BEHAVIORAL SYMPTOMS/MOOD NEUROLOGICAL BOWEL NUTRITION STATUS   (None)  (None) Continent Diet (Heart healthy/carb modified)  AMBULATORY STATUS COMMUNICATION OF NEEDS Skin     Verbally  Other (Comment), PU Stage and Appropriate Care (Erythema/redness. Irritant contact dermatitis: Moisturizing cream.)   PU Stage 2 Dressing:  (Buttocks: No dressing listed.)                   Personal  Care Assistance Level of Assistance              Functional Limitations Info  Sight, Hearing, Speech Sight Info: Adequate Hearing Info: Adequate Speech Info: Adequate    SPECIAL CARE FACTORS FREQUENCY                       Contractures Contractures Info: Not present    Additional Factors Info  Code Status, Allergies Code Status Info: DNR Allergies Info: NKDA           Current Medications (06/26/2024):  This is the current hospital active medication list Current Facility-Administered Medications  Medication Dose Route Frequency Provider Last Rate Last Admin   acetaminophen  (TYLENOL ) tablet 650 mg  650 mg Oral Q6H PRN Cox, Amy N, DO       Or   acetaminophen  (TYLENOL ) suppository 650 mg  650 mg Rectal Q6H PRN Cox, Amy N, DO       bisacodyl  (DULCOLAX) EC tablet 10 mg  10 mg Oral Daily PRN Ponnala, Shruthi, MD       bisacodyl  (DULCOLAX) suppository 10 mg  10 mg Rectal Daily PRN Ponnala, Shruthi, MD   10 mg at 06/26/24 1535   carvedilol  (COREG ) tablet 6.25 mg  6.25 mg Oral BID WC Cox, Amy N, DO   6.25 mg at 06/26/24 1626   Chlorhexidine  Gluconate Cloth 2 % PADS 6 each  6 each Topical Q0600 Lateef, [place], MD   6 each at 06/23/24 2200   diltiazem  (CARDIZEM  CD) 24 hr capsule 240 mg  240 mg Oral QPM Cox, Amy N, DO   240 mg at 06/25/24 1658   Doxepin  HCl TABS 6 mg  6 mg Oral QHS Ponnala, Shruthi, MD       epoetin  alfa-epbx (RETACRIT ) injection 4,000 Units  4,000 Units Intravenous Q M,W,F-1800 Breeze, Faith, NP   4,000 Units at 06/26/24 1209   famotidine  (PEPCID ) tablet 10 mg  10 mg Oral Daily Ponnala, Shruthi, MD   10 mg at 06/26/24 1356   furosemide  (LASIX ) tablet 80 mg  80 mg Oral Daily Ponnala, Shruthi, MD   80 mg at 06/26/24 1412   Gerhardt's butt cream   Topical TID Jerelene Critchley, MD   Given at 06/26/24 1626   heparin  injection 5,000 Units  5,000 Units Subcutaneous Q8H Ponnala, Shruthi, MD   5,000 Units at 06/26/24 1359   hydrALAZINE  (APRESOLINE ) injection 5 mg  5  mg Intravenous Q6H PRN Cox, Amy N, DO       insulin  aspart (novoLOG ) injection 0-15 Units  0-15 Units Subcutaneous TID WC Ponnala, Shruthi, MD   8 Units at 06/26/24 1626   insulin  aspart (novoLOG ) injection 0-5 Units  0-5 Units Subcutaneous QHS Ponnala, Shruthi, MD   3 Units at 06/25/24 2208   insulin  aspart (novoLOG ) injection 5 Units  5 Units Subcutaneous TID WC Ponnala, Shruthi, MD   5 Units at 06/26/24 1626   insulin  glargine (LANTUS ) injection 12 Units  12 Units Subcutaneous BID Ponnala, Shruthi, MD   12 Units at 06/25/24 2207   lactulose  (CHRONULAC ) 10 GM/15ML solution 20 g  20 g Oral BID Ponnala, Shruthi, MD       levothyroxine  (SYNTHROID ) tablet 175 mcg  175  mcg Oral Q0600 Cox, Amy N, DO   175 mcg at 06/26/24 9464   linaclotide  (LINZESS ) capsule 145 mcg  145 mcg Oral QAC breakfast Ponnala, Shruthi, MD   145 mcg at 06/25/24 0817   melatonin tablet 10 mg  10 mg Oral QHS Cox, Amy N, DO   10 mg at 06/25/24 2208   nicotine  (NICODERM CQ  - dosed in mg/24 hours) patch 21 mg  21 mg Transdermal Daily PRN Cox, Amy N, DO   21 mg at 06/25/24 2213   ondansetron  (ZOFRAN ) tablet 4 mg  4 mg Oral Q6H PRN Cox, Amy N, DO       Or   ondansetron  (ZOFRAN ) injection 4 mg  4 mg Intravenous Q6H PRN Cox, Amy N, DO   4 mg at 06/26/24 0820   oxyCODONE  (Oxy IR/ROXICODONE ) immediate release tablet 10 mg  10 mg Oral Q6H PRN Ponnala, Shruthi, MD   10 mg at 06/25/24 2208   oxyCODONE  (Oxy IR/ROXICODONE ) immediate release tablet 5 mg  5 mg Oral Q6H PRN Ponnala, Shruthi, MD       polyethylene glycol (MIRALAX  / GLYCOLAX ) packet 17 g  17 g Oral Daily Ponnala, Shruthi, MD   17 g at 06/26/24 1358   senna-docusate (Senokot-S) tablet 1 tablet  1 tablet Oral QHS PRN Cox, Amy N, DO   1 tablet at 06/25/24 2208     Discharge Medications: Please see discharge summary for a list of discharge medications.  Relevant Imaging Results:  Relevant Lab Results:   Additional Information SS#: [id number]. [facility] on MWF at 11:45  chair time.  Lauraine JAYSON Carpen, LCSW

## 2024-06-26 NOTE — Progress Notes (Addendum)
  Received patient in bed to unit.   Informed consent signed and in chart.    TX 3:30 hrs     Transported by  Hand-off given to patient's nurse.    Access used: Right internal jugular cath Access issues: yes Unable to aspirate venous line. Cath flow was instilled per prn orders. Notified NP Breeze and approved for cath flow for 1 hr. Pt ran reverse even after cath flow but was able to manage a 300 BFR for the remainder of tx.    Total UF removed: 2500L Medication(s) given: Epo 4,000u, Zofran  4 mgs Post HD VS: wnl Post HD weight: 127.0 kg     N. Flornce Record LPN [facility]

## 2024-06-26 NOTE — Progress Notes (Signed)
 [facility]  ROUNDING NOTE   Subjective:   Eugene [name] is a 63 y.o. male with past medical conditions including diabetes, bilateral BKA, suprapubic catheter, hepatitis C, and end-stage renal disease on hemodialysis.  Patient presented to the emergency department from nursing facility with altered mental status and respiratory distress.  Patient currently admitted to ICU for Anasarca [R60.1] Severe sepsis with acute organ dysfunction (HCC) [A41.9, R65.20] Acute on chronic respiratory failure with hypoxia (HCC) [J96.21] Altered mental status, unspecified altered mental status type [R41.82] Pneumonia due to infectious organism, unspecified laterality, unspecified part of lung [J18.9]  Patient is known to our practice and receives outpatient dialysis treatments at [facility] on a MWF schedule, supervised by Dr. Dennise.   Update:  Patient seen and evaluated during dialysis   HEMODIALYSIS FLOWSHEET:  Blood Flow Rate (mL/min): 249 mL/min Arterial Pressure (mmHg): -117.16 mmHg Venous Pressure (mmHg): 444.42 mmHg TMP (mmHg): 43.84 mmHg Ultrafiltration Rate (mL/min): 1477 mL/min Dialysate Flow Rate (mL/min): 300 ml/min Dialysis Fluid Bolus: Normal Saline  7 L high flow Receiving cathflo dwell Thoracentesis on 10/1 with 0.25 L of pleural fluid removal  Objective:  Vital signs in last 24 hours:  Temp:  [97.7 F (36.5 C)-98.5 F (36.9 C)] 97.9 F (36.6 C) (10/03 0752) Pulse Rate:  [59-65] 65 (10/03 1115) Resp:  [10-20] 14 (10/03 1115) BP: (110-153)/(64-87) 110/69 (10/03 1115) SpO2:  [94 %-100 %] 94 % (10/03 1115) Weight:  [129.5 kg] 129.5 kg (10/03 0749)  Weight change:  Filed Weights   06/22/24 1537 06/26/24 0749  Weight: 136.1 kg 129.5 kg    Intake/Output: I/O last 3 completed shifts: In: 262 [P.O.:240; I.V.:22] Out: 525 [Urine:525]   Intake/Output this shift:  No intake/output data recorded.  Physical Exam: General: NAD  Head: Normocephalic,  atraumatic. Moist oral mucosal membranes  Eyes: Anicteric  Lungs:  Diminished, HFNC  Heart: Regular rate and rhythm  Abdomen:  Soft, nontender, obese  Extremities: 1-2+ peripheral edema.  Bilateral AKA  Neurologic: Awake, alert, conversant  Skin: Warm,dry, no rash  Access: Right IJ PermCath    Basic Metabolic Panel: Recent Labs  Lab 06/22/24 1553 06/23/24 0347 06/24/24 0505 06/25/24 0500 06/26/24 0420  NA 133* 131* 134* 132* 134*  K 5.4* 4.7 5.1 3.7 4.5  CL 99 97* 99 99 100  CO2 20* 23 25 25 25   GLUCOSE 335* 373* 366* 283* 231*  BUN 63* 51* 65* 41* 42*  CREATININE 6.07* 4.65* 5.12* 3.51* 3.52*  CALCIUM  7.7* 7.5* 7.4* 7.3* 7.4*  MG 2.5*  --   --   --   --   PHOS 7.1*  --   --   --   --     Liver Function Tests: Recent Labs  Lab 06/22/24 1553  AST 114*  ALT 102*  ALKPHOS 70  BILITOT 0.6  PROT 7.0  ALBUMIN  2.4*   No results for input(s): LIPASE, AMYLASE in the last 168 hours. No results for input(s): AMMONIA in the last 168 hours.  CBC: Recent Labs  Lab 06/22/24 1553 06/23/24 0347 06/24/24 0505 06/24/24 1010 06/25/24 0500 06/26/24 0845  WBC 15.1* 19.2* 9.6 12.8* 8.9 5.6  NEUTROABS 11.4*  --   --   --   --   --   HGB 9.7* 9.1* 9.0* 8.7* 9.1* 9.3*  HCT 31.7* 29.9* 29.7* 27.7* 29.3* 29.7*  MCV 96.4 95.5 96.1 96.2 94.8 95.8  PLT 214 210 201 205 186 179    Cardiac Enzymes: No results for input(s): CKTOTAL, CKMB,  CKMBINDEX, TROPONINI in the last 168 hours.  BNP: Invalid input(s): POCBNP  CBG: Recent Labs  Lab 06/25/24 0741 06/25/24 1140 06/25/24 1607 06/25/24 2011 06/26/24 0726  GLUCAP 284* 335* 260* 257* 192*    Microbiology: Results for orders placed or performed during the hospital encounter of 06/22/24  Culture, blood (routine x 2)     Status: None (Preliminary result)   Collection Time: 06/22/24  3:53 PM   Specimen: BLOOD  Result Value Ref Range Status   Specimen Description BLOOD BLOOD LEFT ARM  Final   Special  Requests   Final    BOTTLES DRAWN AEROBIC AND ANAEROBIC Blood Culture adequate volume   Culture   Final    NO GROWTH 4 DAYS Performed at [facility], [address], [place], [place] [address]    Report Status PENDING  Incomplete  Resp panel by RT-PCR (RSV, Flu A&B, Covid) Anterior Nasal Swab     Status: None   Collection Time: 06/22/24  3:53 PM   Specimen: Anterior Nasal Swab  Result Value Ref Range Status   SARS Coronavirus 2 by RT PCR NEGATIVE NEGATIVE Final    Comment: (NOTE) SARS-CoV-2 target nucleic acids are NOT DETECTED.  The SARS-CoV-2 RNA is generally detectable in upper respiratory specimens during the acute phase of infection. The lowest concentration of SARS-CoV-2 viral copies this assay can detect is 138 copies/mL. A negative result does not preclude SARS-Cov-2 infection and should not be used as the sole basis for treatment or other patient management decisions. A negative result may occur with  improper specimen collection/handling, submission of specimen other than nasopharyngeal swab, presence of viral mutation(s) within the areas targeted by this assay, and inadequate number of viral copies(<138 copies/mL). A negative result must be combined with clinical observations, patient history, and epidemiological information. The expected result is Negative.  Fact Sheet for Patients:  BloggerCourse.com  Fact Sheet for Healthcare Providers:  SeriousBroker.it  This test is no t yet approved or cleared by the [place]  FDA and  has been authorized for detection and/or diagnosis of SARS-CoV-2 by FDA under an Emergency Use Authorization (EUA). This EUA will remain  in effect (meaning this test can be used) for the duration of the COVID-19 declaration under Section 564(b)(1) of the Act, 21 U.S.C.section 360bbb-3(b)(1), unless the authorization is terminated  or revoked sooner.       Influenza A by PCR  NEGATIVE NEGATIVE Final   Influenza B by PCR NEGATIVE NEGATIVE Final    Comment: (NOTE) The Xpert Xpress SARS-CoV-2/FLU/RSV plus assay is intended as an aid in the diagnosis of influenza from Nasopharyngeal swab specimens and should not be used as a sole basis for treatment. Nasal washings and aspirates are unacceptable for Xpert Xpress SARS-CoV-2/FLU/RSV testing.  Fact Sheet for Patients: BloggerCourse.com  Fact Sheet for Healthcare Providers: SeriousBroker.it  This test is not yet approved or cleared by the [place]  FDA and has been authorized for detection and/or diagnosis of SARS-CoV-2 by FDA under an Emergency Use Authorization (EUA). This EUA will remain in effect (meaning this test can be used) for the duration of the COVID-19 declaration under Section 564(b)(1) of the Act, 21 U.S.C. section 360bbb-3(b)(1), unless the authorization is terminated or revoked.     Resp Syncytial Virus by PCR NEGATIVE NEGATIVE Final    Comment: (NOTE) Fact Sheet for Patients: BloggerCourse.com  Fact Sheet for Healthcare Providers: SeriousBroker.it  This test is not yet approved or cleared by the [place]  FDA and has been  authorized for detection and/or diagnosis of SARS-CoV-2 by FDA under an Emergency Use Authorization (EUA). This EUA will remain in effect (meaning this test can be used) for the duration of the COVID-19 declaration under Section 564(b)(1) of the Act, 21 U.S.C. section 360bbb-3(b)(1), unless the authorization is terminated or revoked.  Performed at [facility], [address] [place], [place] [address]   Urine Culture     Status: Abnormal   Collection Time: 06/22/24  4:00 PM   Specimen: Urine, Suprapubic  Result Value Ref Range Status   Specimen Description   Final    URINE, SUPRAPUBIC Performed at [facility], [address],  [place], [place] [address]    Special Requests   Final    NONE Performed at [facility], [address] [place], [place] [address]    Culture (A)  Final    >=100,000 COLONIES/mL ESCHERICHIA COLI >=100,000 COLONIES/mL PROVIDENCIA STUARTII Confirmed Extended Spectrum Beta-Lactamase Producer (ESBL).  In bloodstream infections from ESBL organisms, carbapenems are preferred over piperacillin /tazobactam. They are shown to have a lower risk of mortality.    Report Status 06/25/2024 FINAL  Final   Organism ID, Bacteria PROVIDENCIA STUARTII (A)  Final   Organism ID, Bacteria ESCHERICHIA COLI (A)  Final      Susceptibility   Escherichia coli - MIC*    AMPICILLIN >=32 RESISTANT Resistant     CEFAZOLIN  (URINE) Value in next row Resistant      >=32 RESISTANTThis is a modified FDA-approved test that has been validated and its performance characteristics determined by the reporting laboratory.  This laboratory is certified under the Clinical Laboratory Improvement Amendments CLIA as qualified to perform high complexity clinical laboratory testing.    CEFEPIME  Value in next row Resistant      >=32 RESISTANTThis is a modified FDA-approved test that has been validated and its performance characteristics determined by the reporting laboratory.  This laboratory is certified under the Clinical Laboratory Improvement Amendments CLIA as qualified to perform high complexity clinical laboratory testing.    ERTAPENEM Value in next row Sensitive      >=32 RESISTANTThis is a modified FDA-approved test that has been validated and its performance characteristics determined by the reporting laboratory.  This laboratory is certified under the Clinical Laboratory Improvement Amendments CLIA as qualified to perform high complexity clinical laboratory testing.    CEFTRIAXONE  Value in next row Resistant      >=32 RESISTANTThis is a modified FDA-approved test that has been validated and its performance characteristics  determined by the reporting laboratory.  This laboratory is certified under the Clinical Laboratory Improvement Amendments CLIA as qualified to perform high complexity clinical laboratory testing.    CIPROFLOXACIN  Value in next row Resistant      >=32 RESISTANTThis is a modified FDA-approved test that has been validated and its performance characteristics determined by the reporting laboratory.  This laboratory is certified under the Clinical Laboratory Improvement Amendments CLIA as qualified to perform high complexity clinical laboratory testing.    GENTAMICIN Value in next row Sensitive      >=32 RESISTANTThis is a modified FDA-approved test that has been validated and its performance characteristics determined by the reporting laboratory.  This laboratory is certified under the Clinical Laboratory Improvement Amendments CLIA as qualified to perform high complexity clinical laboratory testing.    NITROFURANTOIN Value in next row Sensitive      >=32 RESISTANTThis is a modified FDA-approved test that has been validated and its performance characteristics determined by  the reporting laboratory.  This laboratory is certified under the Clinical Laboratory Improvement Amendments CLIA as qualified to perform high complexity clinical laboratory testing.    TRIMETH /SULFA  Value in next row Sensitive      >=32 RESISTANTThis is a modified FDA-approved test that has been validated and its performance characteristics determined by the reporting laboratory.  This laboratory is certified under the Clinical Laboratory Improvement Amendments CLIA as qualified to perform high complexity clinical laboratory testing.    AMPICILLIN/SULBACTAM Value in next row Resistant      >=32 RESISTANTThis is a modified FDA-approved test that has been validated and its performance characteristics determined by the reporting laboratory.  This laboratory is certified under the Clinical Laboratory Improvement Amendments CLIA as qualified to  perform high complexity clinical laboratory testing.    PIP/TAZO Value in next row Sensitive      16 SENSITIVEThis is a modified FDA-approved test that has been validated and its performance characteristics determined by the reporting laboratory.  This laboratory is certified under the Clinical Laboratory Improvement Amendments CLIA as qualified to perform high complexity clinical laboratory testing.    MEROPENEM Value in next row Sensitive      16 SENSITIVEThis is a modified FDA-approved test that has been validated and its performance characteristics determined by the reporting laboratory.  This laboratory is certified under the Clinical Laboratory Improvement Amendments CLIA as qualified to perform high complexity clinical laboratory testing.    * >=100,000 COLONIES/mL ESCHERICHIA COLI   Providencia stuartii - MIC*    AMPICILLIN Value in next row Resistant      16 SENSITIVEThis is a modified FDA-approved test that has been validated and its performance characteristics determined by the reporting laboratory.  This laboratory is certified under the Clinical Laboratory Improvement Amendments CLIA as qualified to perform high complexity clinical laboratory testing.    CEFEPIME  Value in next row Sensitive      16 SENSITIVEThis is a modified FDA-approved test that has been validated and its performance characteristics determined by the reporting laboratory.  This laboratory is certified under the Clinical Laboratory Improvement Amendments CLIA as qualified to perform high complexity clinical laboratory testing.    ERTAPENEM Value in next row Sensitive      16 SENSITIVEThis is a modified FDA-approved test that has been validated and its performance characteristics determined by the reporting laboratory.  This laboratory is certified under the Clinical Laboratory Improvement Amendments CLIA as qualified to perform high complexity clinical laboratory testing.    CEFTRIAXONE  Value in next row Sensitive       16 SENSITIVEThis is a modified FDA-approved test that has been validated and its performance characteristics determined by the reporting laboratory.  This laboratory is certified under the Clinical Laboratory Improvement Amendments CLIA as qualified to perform high complexity clinical laboratory testing.    CIPROFLOXACIN  Value in next row Resistant      16 SENSITIVEThis is a modified FDA-approved test that has been validated and its performance characteristics determined by the reporting laboratory.  This laboratory is certified under the Clinical Laboratory Improvement Amendments CLIA as qualified to perform high complexity clinical laboratory testing.    GENTAMICIN Value in next row Resistant      16 SENSITIVEThis is a modified FDA-approved test that has been validated and its performance characteristics determined by the reporting laboratory.  This laboratory is certified under the Clinical Laboratory Improvement Amendments CLIA as qualified to perform high complexity clinical laboratory testing.    NITROFURANTOIN Value in next row Resistant  16 SENSITIVEThis is a modified FDA-approved test that has been validated and its performance characteristics determined by the reporting laboratory.  This laboratory is certified under the Clinical Laboratory Improvement Amendments CLIA as qualified to perform high complexity clinical laboratory testing.    TRIMETH /SULFA  Value in next row Sensitive      16 SENSITIVEThis is a modified FDA-approved test that has been validated and its performance characteristics determined by the reporting laboratory.  This laboratory is certified under the Clinical Laboratory Improvement Amendments CLIA as qualified to perform high complexity clinical laboratory testing.    AMPICILLIN/SULBACTAM Value in next row Intermediate      16 SENSITIVEThis is a modified FDA-approved test that has been validated and its performance characteristics determined by the reporting laboratory.   This laboratory is certified under the Clinical Laboratory Improvement Amendments CLIA as qualified to perform high complexity clinical laboratory testing.    PIP/TAZO Value in next row Sensitive      <=4 SENSITIVEThis is a modified FDA-approved test that has been validated and its performance characteristics determined by the reporting laboratory.  This laboratory is certified under the Clinical Laboratory Improvement Amendments CLIA as qualified to perform high complexity clinical laboratory testing.    MEROPENEM Value in next row Sensitive      <=4 SENSITIVEThis is a modified FDA-approved test that has been validated and its performance characteristics determined by the reporting laboratory.  This laboratory is certified under the Clinical Laboratory Improvement Amendments CLIA as qualified to perform high complexity clinical laboratory testing.    * >=100,000 COLONIES/mL PROVIDENCIA STUARTII  Culture, blood (routine x 2)     Status: None (Preliminary result)   Collection Time: 06/23/24 11:23 AM   Specimen: BLOOD  Result Value Ref Range Status   Specimen Description BLOOD BLOOD RIGHT HAND  Final   Special Requests   Final    BOTTLES DRAWN AEROBIC AND ANAEROBIC Blood Culture adequate volume   Culture   Final    NO GROWTH 3 DAYS Performed at [facility], [address] [place], [place] [address]    Report Status PENDING  Incomplete  Body fluid culture w Gram Stain     Status: None (Preliminary result)   Collection Time: 06/24/24  3:55 PM   Specimen: PATH Cytology Pleural fluid  Result Value Ref Range Status   Specimen Description   Final    PLEURAL Performed at [facility], [address] [place], [place] [address]    Special Requests   Final    NONE Performed at [facility], [address] [place], [place] [address]    Gram Stain   Final    WBC PRESENT,BOTH PMN AND MONONUCLEAR NO ORGANISMS SEEN CYTOSPIN SMEAR    Culture   Final    NO GROWTH 2  DAYS Performed at [facility], [address], [place], [place] [address]    Report Status PENDING  Incomplete    Coagulation Studies: No results for input(s): LABPROT, INR in the last 72 hours.   Urinalysis: No results for input(s): COLORURINE, LABSPEC, PHURINE, GLUCOSEU, HGBUR, BILIRUBINUR, KETONESUR, PROTEINUR, UROBILINOGEN, NITRITE, LEUKOCYTESUR in the last 72 hours.  Invalid input(s): APPERANCEUR     Imaging: US  THORACENTESIS ASP PLEURAL SPACE W/IMG GUIDE Result Date: 06/25/2024 INDICATION: 63 year old male with history of ESRD on HD, presenting with shortness of breath and altered mental status, noted with recurrent bilateral pleural effusions. IR was requested for diagnostic and therapeutic thoracentesis. EXAM: ULTRASOUND GUIDED DIAGNOSTIC AND THERAPEUTIC LEFT-SIDED  THORACENTESIS MEDICATIONS: 7 cc of 1% lidocaine . COMPLICATIONS: None immediate. PROCEDURE: An ultrasound guided thoracentesis was thoroughly discussed with the patient and questions answered. The benefits, risks, alternatives and complications were also discussed. The patient understands and wishes to proceed with the procedure. Written consent was obtained. Ultrasound was performed to localize and mark an adequate pocket of fluid in the left chest. The area was then prepped and draped in the normal sterile fashion. 1% Lidocaine  was used for local anesthesia. Under ultrasound guidance a 6 Fr Safe-T-Centesis catheter was introduced. Thoracentesis was performed. The catheter was removed and a dressing applied. FINDINGS: A total of approximately 250 cc of clear, straw-colored pleural fluid was removed. Samples were sent to the laboratory as requested by the clinical team. IMPRESSION: Successful ultrasound guided left thoracentesis yielding 250 cc of pleural fluid. Procedure performed by Carlin Griffon, PA-C Electronically Signed   By: JONETTA Faes M.D.   On: 06/25/2024 07:13   DG Chest Port 1  View Result Date: 06/24/2024 CLINICAL DATA:  Status post left thoracentesis. EXAM: PORTABLE CHEST 1 VIEW COMPARISON:  06/22/2024 FINDINGS: No pneumothorax. Stable enlarged cardiac silhouette and bibasilar atelectasis. Small bilateral pleural effusions, mildly decreased on the left. Stable right jugular double-lumen catheter with the lumen tips in the superior vena cava and region of the superior cavoatrial junction. Stable thoracic spine fixation hardware. IMPRESSION: 1. No pneumothorax following left thoracentesis. 2. Small bilateral pleural effusions, mildly decreased on the left. 3. Stable bibasilar atelectasis. Electronically Signed   By: Elspeth Bathe M.D.   On: 06/24/2024 16:43     Medications:    cefTRIAXone  (ROCEPHIN )  IV 2 g (06/25/24 0943)    azithromycin   500 mg Oral Once   carvedilol   6.25 mg Oral BID WC   Chlorhexidine  Gluconate Cloth  6 each Topical Q0600   diltiazem   240 mg Oral QPM   Doxepin  HCl  6 mg Oral QHS   epoetin  alfa-epbx (RETACRIT ) injection  4,000 Units Intravenous Q M,W,F-1800   famotidine   10 mg Oral Daily   Gerhardt's butt cream   Topical TID   heparin   5,000 Units Subcutaneous Q8H   insulin  aspart  0-15 Units Subcutaneous TID WC   insulin  aspart  0-5 Units Subcutaneous QHS   insulin  aspart  5 Units Subcutaneous TID WC   insulin  glargine  12 Units Subcutaneous BID   lactulose   20 g Oral BID   levothyroxine   175 mcg Oral Q0600   linaclotide   145 mcg Oral QAC breakfast   melatonin  10 mg Oral QHS   polyethylene glycol  17 g Oral Daily   acetaminophen  **OR** acetaminophen , bisacodyl , bisacodyl , heparin , heparin , hydrALAZINE , nicotine , ondansetron  **OR** ondansetron  (ZOFRAN ) IV, oxyCODONE , oxyCODONE , senna-docusate  Assessment/ Plan:  Eugene [name] is a 63 y.o.  male with past medical conditions including diabetes, bilateral BKA, suprapubic catheter, hepatitis C, and end-stage renal disease on hemodialysis.  Patient presented to the emergency department  from nursing facility with altered mental status and respiratory distress.  Patient currently admitted to ICU for Anasarca [R60.1] Severe sepsis with acute organ dysfunction (HCC) [A41.9, R65.20] Acute on chronic respiratory failure with hypoxia (HCC) [J96.21] Altered mental status, unspecified altered mental status type [R41.82] Pneumonia due to infectious organism, unspecified laterality, unspecified part of lung [J18.9]  [facility]/MWF/right PermCath  End-stage renal disease with hyperkalemia on hemodialysis.  Receiving dialysis today, uF goal 3L as tolerated. Next treatment scheduled for Monday  2.  Acute respiratory failure, patient placed on BiPAP on ED  arrival.  Remains on 7L hfnc. Thoracentesis on 10/1 with 0.25L fluid removal.  Will attempt more aggressive fluid removal with dialysis  3. Anemia of chronic kidney disease Lab Results  Component Value Date   HGB 9.3 (L) 06/26/2024    Hemoglobin borderline, continue Retacrit  4000 unit with dialysis  4. Diabetes mellitus type II with chronic kidney disease/renal manifestations: insulin  dependent. Home regimen includes glargine and lispro. Most recent hemoglobin A1c is 6.0 on 04/14/2024.   Primary team to continue management.     LOS: 4 Ausha Sieh 10/3/202511:45 AM

## 2024-06-26 NOTE — Progress Notes (Signed)
 Patient left for dialysis.

## 2024-06-26 NOTE — Plan of Care (Signed)
  Problem: Coping: Goal: Ability to adjust to condition or change in health will improve Outcome: Progressing   Problem: Clinical Measurements: Goal: Ability to maintain clinical measurements within normal limits will improve Outcome: Progressing   Problem: Clinical Measurements: Goal: Respiratory complications will improve Outcome: Progressing   Problem: Clinical Measurements: Goal: Cardiovascular complication will be avoided Outcome: Progressing    Problem: Pain Managment: Goal: General experience of comfort will improve and/or be controlled Outcome: Progressing   Problem: Safety: Goal: Ability to remain free from injury will improve Outcome: Progressing

## 2024-06-26 NOTE — Progress Notes (Signed)
Patient arrived back to unit from dialysis.

## 2024-06-26 NOTE — Plan of Care (Signed)
  Problem: Health Behavior/Discharge Planning: Goal: Ability to identify and utilize available resources and services will improve Outcome: Progressing Goal: Ability to manage health-related needs will improve Outcome: Progressing   Problem: Metabolic: Goal: Ability to maintain appropriate glucose levels will improve Outcome: Progressing   Problem: Nutritional: Goal: Maintenance of adequate nutrition will improve Outcome: Progressing   Problem: Skin Integrity: Goal: Risk for impaired skin integrity will decrease Outcome: Progressing   Problem: Tissue Perfusion: Goal: Adequacy of tissue perfusion will improve Outcome: Progressing

## 2024-06-27 DIAGNOSIS — R6520 Severe sepsis without septic shock: Secondary | ICD-10-CM | POA: Diagnosis not present

## 2024-06-27 DIAGNOSIS — A419 Sepsis, unspecified organism: Secondary | ICD-10-CM | POA: Diagnosis not present

## 2024-06-27 LAB — CULTURE, BLOOD (ROUTINE X 2)
Culture: NO GROWTH
Special Requests: ADEQUATE

## 2024-06-27 LAB — BODY FLUID CULTURE W GRAM STAIN: Culture: NO GROWTH

## 2024-06-27 LAB — GLUCOSE, CAPILLARY
Glucose-Capillary: 187 mg/dL — ABNORMAL HIGH (ref 70–99)
Glucose-Capillary: 234 mg/dL — ABNORMAL HIGH (ref 70–99)
Glucose-Capillary: 282 mg/dL — ABNORMAL HIGH (ref 70–99)
Glucose-Capillary: 256 mg/dL — ABNORMAL HIGH (ref 70–99)

## 2024-06-27 MED ORDER — HEPARIN SODIUM (PORCINE) 1000 UNIT/ML DIALYSIS: 1000.0000 [IU] | INTRAMUSCULAR | Status: DC | PRN

## 2024-06-27 MED ORDER — HEPARIN SODIUM (PORCINE) 1000 UNIT/ML IJ SOLN
INTRAMUSCULAR | Status: AC
  Filled 2024-06-27: qty 5

## 2024-06-27 MED ORDER — ALTEPLASE 2 MG IJ SOLR: 2.0000 mg | Freq: Once | INTRAMUSCULAR | Status: DC | PRN

## 2024-06-27 MED ORDER — INSULIN GLARGINE 100 UNIT/ML ~~LOC~~ SOLN
14.0000 [IU] | Freq: Two times a day (BID) | SUBCUTANEOUS | Status: DC
  Administered 2024-06-27 – 2024-06-28 (×3): 14 [IU] via SUBCUTANEOUS
  Filled 2024-06-27 (×6): qty 0.14

## 2024-06-27 NOTE — Progress Notes (Signed)
 Patient ID: Eugene [name], male   DOB: 03/23/1961, 63 y.o.   MRN: [id number] [facility]    SUBJECTIVE: Resting comfortably in bed no pain no worsening shortness of breath has had difficulty sleeping has had chronic back trouble no chest pain shortness of breath is pretty much unchanged status post thoracentesis which seems to have helped shortness of breath   Vitals:   06/26/24 2100 06/26/24 2328 06/27/24 0456 06/27/24 0752  BP:  (!) 158/75 (!) 169/75 (!) 148/79  Pulse:  68 66 71  Resp:  18 18 20   Temp:  98.6 F (37 C) 97.8 F (36.6 C) 98.6 F (37 C)  TempSrc:    Oral  SpO2: 97% 96% 97% 96%  Weight:      Height:         Intake/Output Summary (Last 24 hours) at 06/27/2024 1117 Last data filed at 06/27/2024 0655 Gross per 24 hour  Intake --  Output 5700 ml  Net -5700 ml      PHYSICAL EXAM  General: Well developed, well nourished, in no acute distress HEENT:  Normocephalic and atramatic Neck:  No JVD.  Lungs: Clear bilaterally to auscultation and percussion. Heart: HRRR . Normal S1 and S2 without gallops or murmurs.  Abdomen: Bowel sounds are positive, abdomen soft and non-tender  Msk:  Back normal, normal gait. Normal strength and tone for age. Extremities: No clubbing, cyanosis or edema.   Neuro: Alert and oriented X 3. Psych:  Good affect, responds appropriately   LABS: Basic Metabolic Panel: Recent Labs    06/25/24 0500 06/26/24 0420  NA 132* 134*  K 3.7 4.5  CL 99 100  CO2 25 25  GLUCOSE 283* 231*  BUN 41* 42*  CREATININE 3.51* 3.52*  CALCIUM  7.3* 7.4*   Liver Function Tests: No results for input(s): AST, ALT, ALKPHOS, BILITOT, PROT, ALBUMIN  in the last 72 hours. No results for input(s): LIPASE, AMYLASE in the last 72 hours. CBC: Recent Labs    06/25/24 0500 06/26/24 0845  WBC 8.9 5.6  HGB 9.1* 9.3*  HCT 29.3* 29.7*  MCV 94.8 95.8  PLT 186 179   Cardiac Enzymes: No results for input(s): CKTOTAL, CKMB, CKMBINDEX,  TROPONINI in the last 72 hours. BNP: Invalid input(s): POCBNP D-Dimer: No results for input(s): DDIMER in the last 72 hours. Hemoglobin A1C: No results for input(s): HGBA1C in the last 72 hours. Fasting Lipid Panel: No results for input(s): CHOL, HDL, LDLCALC, TRIG, CHOLHDL, LDLDIRECT in the last 72 hours. Thyroid Function Tests: No results for input(s): TSH, T4TOTAL, T3FREE, THYROIDAB in the last 72 hours.  Invalid input(s): FREET3 Anemia Panel: No results for input(s): VITAMINB12, FOLATE, FERRITIN, TIBC, IRON , RETICCTPCT in the last 72 hours.  DG Chest 1 View Result Date: 06/26/2024 EXAM: 1 VIEW XRAY OF THE CHEST 06/26/2024 04:41:00 PM COMPARISON: 06/24/2024 CLINICAL HISTORY: Pulmonary edema FINDINGS: LINES, TUBES AND DEVICES: Stable right internal jugular hemodialysis catheter. LUNGS AND PLEURA: No focal pulmonary opacity. No pulmonary edema. Small to moderate left and small right pleural effusions, unchanged. Bibasilar atelectasis. Stable mild diffuse interstitial prominence. No pneumothorax. HEART AND MEDIASTINUM: No acute abnormality of the cardiac and mediastinal silhouettes. BONES AND SOFT TISSUES: Thoracic fusion hardware noted. No acute osseous abnormality. IMPRESSION: 1. Small to moderate left and small right pleural effusions, and bibasilar atelectasis. 2. Stable mild diffuse interstitial prominence Electronically signed by: Donnice Mania MD 06/26/2024 06:46 PM EDT RP Workstation: [id number]     Echo EF greater than 55%  TELEMETRY: :  ASSESSMENT AND  PLAN:  Principal Problem:   Severe sepsis with acute organ dysfunction (HCC) Active Problems:   Type 2 diabetes mellitus with hyperglycemia, with long-term current use of insulin  (HCC)   HTN (hypertension)   Dyslipidemia   Chronic pain syndrome   GERD (gastroesophageal reflux disease)   Tobacco use disorder   Diabetic gastroparesis (HCC)   Hypothyroidism   Anasarca associated with  disorder of kidney   Obesity, Class III, BMI 40-49.9 (morbid obesity) (HCC)   Elevated troponin Pleural effusion  Plan Sepsis continue broad-spectrum antibiotic therapy with endorgan dysfunction End-stage renal disease continue dialysis treatment Diabetes continue insulin  therapy A1c goal less than 7 Hypertension continue aggressive hypertension management and control Consider treatment for diastolic dysfunction SGLT2 spironolactone  if okay with nephrology strict blood pressure control Okay to continue diltiazem  or beta-blocker 1 p.o. as needed Consider discontinuing heparin  since his pain 48 hours Do not recommend invasive cardiac input at this point Obesity recommend significant weight loss exercise portion control Advised patient refrain from tobacco abuse Elevated troponin probably consistent with demand ischemia not an acute coronary syndrome  Cara JONETTA Lovelace, MD, 06/27/2024 11:17 AM

## 2024-06-27 NOTE — Progress Notes (Signed)
 [facility]  ROUNDING NOTE   Subjective:   Eugene [name] is a 63 y.o. male with past medical conditions including diabetes, bilateral BKA, suprapubic catheter, hepatitis C, and end-stage renal disease on hemodialysis.  Patient presented to the emergency department from nursing facility with altered mental status and respiratory distress.  Patient currently admitted to ICU for Anasarca [R60.1] Severe sepsis with acute organ dysfunction (HCC) [A41.9, R65.20] Acute on chronic respiratory failure with hypoxia (HCC) [J96.21] Altered mental status, unspecified altered mental status type [R41.82] Pneumonia due to infectious organism, unspecified laterality, unspecified part of lung [J18.9]  Patient is known to our practice and receives outpatient dialysis treatments at [facility] on a MWF schedule, supervised by Dr. Dennise.   Update:  Patient seen resting in bed Continues to complain of shortness of breath HFNC 6L Refusing extra dialysis treatment today.  Objective:  Vital signs in last 24 hours:  Temp:  [97.8 F (36.6 C)-99 F (37.2 C)] 98.6 F (37 C) (10/04 0752) Pulse Rate:  [66-72] 71 (10/04 0752) Resp:  [14-20] 20 (10/04 0752) BP: (135-169)/(70-87) 148/79 (10/04 0752) SpO2:  [96 %-97 %] 96 % (10/04 0752) Weight:  [872 kg] 127 kg (10/03 1308)  Weight change:  Filed Weights   06/22/24 1537 06/26/24 0749 06/26/24 1308  Weight: 136.1 kg 129.5 kg 127 kg    Intake/Output: I/O last 3 completed shifts: In: 240 [P.O.:240] Out: 5700 [Urine:3200; Other:2500]   Intake/Output this shift:  No intake/output data recorded.  Physical Exam: General: NAD  Head: Normocephalic, atraumatic. Moist oral mucosal membranes  Eyes: Anicteric  Lungs:  Diminished, HFNC  Heart: Regular rate and rhythm  Abdomen:  Soft, nontender, obese  Extremities: 1-2+ peripheral edema.  Bilateral AKA  Neurologic: Awake, alert, conversant  Skin: Warm,dry, no rash  Access: Right IJ PermCath     Basic Metabolic Panel: Recent Labs  Lab 06/22/24 1553 06/23/24 0347 06/24/24 0505 06/25/24 0500 06/26/24 0420  NA 133* 131* 134* 132* 134*  K 5.4* 4.7 5.1 3.7 4.5  CL 99 97* 99 99 100  CO2 20* 23 25 25 25   GLUCOSE 335* 373* 366* 283* 231*  BUN 63* 51* 65* 41* 42*  CREATININE 6.07* 4.65* 5.12* 3.51* 3.52*  CALCIUM  7.7* 7.5* 7.4* 7.3* 7.4*  MG 2.5*  --   --   --   --   PHOS 7.1*  --   --   --   --     Liver Function Tests: Recent Labs  Lab 06/22/24 1553  AST 114*  ALT 102*  ALKPHOS 70  BILITOT 0.6  PROT 7.0  ALBUMIN  2.4*   No results for input(s): LIPASE, AMYLASE in the last 168 hours. No results for input(s): AMMONIA in the last 168 hours.  CBC: Recent Labs  Lab 06/22/24 1553 06/23/24 0347 06/24/24 0505 06/24/24 1010 06/25/24 0500 06/26/24 0845  WBC 15.1* 19.2* 9.6 12.8* 8.9 5.6  NEUTROABS 11.4*  --   --   --   --   --   HGB 9.7* 9.1* 9.0* 8.7* 9.1* 9.3*  HCT 31.7* 29.9* 29.7* 27.7* 29.3* 29.7*  MCV 96.4 95.5 96.1 96.2 94.8 95.8  PLT 214 210 201 205 186 179    Cardiac Enzymes: No results for input(s): CKTOTAL, CKMB, CKMBINDEX, TROPONINI in the last 168 hours.  BNP: Invalid input(s): POCBNP  CBG: Recent Labs  Lab 06/26/24 1405 06/26/24 1619 06/26/24 2033 06/27/24 0748 06/27/24 1204  GLUCAP 187* 259* 271* 187* 234*    Microbiology: Results for orders  placed or performed during the hospital encounter of 06/22/24  Culture, blood (routine x 2)     Status: None   Collection Time: 06/22/24  3:53 PM   Specimen: BLOOD  Result Value Ref Range Status   Specimen Description BLOOD BLOOD LEFT ARM  Final   Special Requests   Final    BOTTLES DRAWN AEROBIC AND ANAEROBIC Blood Culture adequate volume   Culture   Final    NO GROWTH 5 DAYS Performed at [facility], [address] [place], [place] [address]    Report Status 06/27/2024 FINAL  Final  Resp panel by RT-PCR (RSV, Flu A&B, Covid) Anterior Nasal Swab     Status:  None   Collection Time: 06/22/24  3:53 PM   Specimen: Anterior Nasal Swab  Result Value Ref Range Status   SARS Coronavirus 2 by RT PCR NEGATIVE NEGATIVE Final    Comment: (NOTE) SARS-CoV-2 target nucleic acids are NOT DETECTED.  The SARS-CoV-2 RNA is generally detectable in upper respiratory specimens during the acute phase of infection. The lowest concentration of SARS-CoV-2 viral copies this assay can detect is 138 copies/mL. A negative result does not preclude SARS-Cov-2 infection and should not be used as the sole basis for treatment or other patient management decisions. A negative result may occur with  improper specimen collection/handling, submission of specimen other than nasopharyngeal swab, presence of viral mutation(s) within the areas targeted by this assay, and inadequate number of viral copies(<138 copies/mL). A negative result must be combined with clinical observations, patient history, and epidemiological information. The expected result is Negative.  Fact Sheet for Patients:  BloggerCourse.com  Fact Sheet for Healthcare Providers:  SeriousBroker.it  This test is no t yet approved or cleared by the [place]  FDA and  has been authorized for detection and/or diagnosis of SARS-CoV-2 by FDA under an Emergency Use Authorization (EUA). This EUA will remain  in effect (meaning this test can be used) for the duration of the COVID-19 declaration under Section 564(b)(1) of the Act, 21 U.S.C.section 360bbb-3(b)(1), unless the authorization is terminated  or revoked sooner.       Influenza A by PCR NEGATIVE NEGATIVE Final   Influenza B by PCR NEGATIVE NEGATIVE Final    Comment: (NOTE) The Xpert Xpress SARS-CoV-2/FLU/RSV plus assay is intended as an aid in the diagnosis of influenza from Nasopharyngeal swab specimens and should not be used as a sole basis for treatment. Nasal washings and aspirates are unacceptable  for Xpert Xpress SARS-CoV-2/FLU/RSV testing.  Fact Sheet for Patients: BloggerCourse.com  Fact Sheet for Healthcare Providers: SeriousBroker.it  This test is not yet approved or cleared by the [place]  FDA and has been authorized for detection and/or diagnosis of SARS-CoV-2 by FDA under an Emergency Use Authorization (EUA). This EUA will remain in effect (meaning this test can be used) for the duration of the COVID-19 declaration under Section 564(b)(1) of the Act, 21 U.S.C. section 360bbb-3(b)(1), unless the authorization is terminated or revoked.     Resp Syncytial Virus by PCR NEGATIVE NEGATIVE Final    Comment: (NOTE) Fact Sheet for Patients: BloggerCourse.com  Fact Sheet for Healthcare Providers: SeriousBroker.it  This test is not yet approved or cleared by the [place]  FDA and has been authorized for detection and/or diagnosis of SARS-CoV-2 by FDA under an Emergency Use Authorization (EUA). This EUA will remain in effect (meaning this test can be used) for the duration of the COVID-19 declaration under Section 564(b)(1) of the Act, 21 U.S.C. section  360bbb-3(b)(1), unless the authorization is terminated or revoked.  Performed at [facility], [address], [place], [place] [address]   Urine Culture     Status: Abnormal   Collection Time: 06/22/24  4:00 PM   Specimen: Urine, Suprapubic  Result Value Ref Range Status   Specimen Description   Final    URINE, SUPRAPUBIC Performed at [facility], [address] [place], [place] [address]    Special Requests   Final    NONE Performed at [facility], [address] [place], [place] [address]    Culture (A)  Final    >=100,000 COLONIES/mL ESCHERICHIA COLI >=100,000 COLONIES/mL PROVIDENCIA STUARTII Confirmed Extended Spectrum Beta-Lactamase Producer (ESBL).  In bloodstream  infections from ESBL organisms, carbapenems are preferred over piperacillin /tazobactam. They are shown to have a lower risk of mortality.    Report Status 06/25/2024 FINAL  Final   Organism ID, Bacteria PROVIDENCIA STUARTII (A)  Final   Organism ID, Bacteria ESCHERICHIA COLI (A)  Final      Susceptibility   Escherichia coli - MIC*    AMPICILLIN >=32 RESISTANT Resistant     CEFAZOLIN  (URINE) Value in next row Resistant      >=32 RESISTANTThis is a modified FDA-approved test that has been validated and its performance characteristics determined by the reporting laboratory.  This laboratory is certified under the Clinical Laboratory Improvement Amendments CLIA as qualified to perform high complexity clinical laboratory testing.    CEFEPIME  Value in next row Resistant      >=32 RESISTANTThis is a modified FDA-approved test that has been validated and its performance characteristics determined by the reporting laboratory.  This laboratory is certified under the Clinical Laboratory Improvement Amendments CLIA as qualified to perform high complexity clinical laboratory testing.    ERTAPENEM Value in next row Sensitive      >=32 RESISTANTThis is a modified FDA-approved test that has been validated and its performance characteristics determined by the reporting laboratory.  This laboratory is certified under the Clinical Laboratory Improvement Amendments CLIA as qualified to perform high complexity clinical laboratory testing.    CEFTRIAXONE  Value in next row Resistant      >=32 RESISTANTThis is a modified FDA-approved test that has been validated and its performance characteristics determined by the reporting laboratory.  This laboratory is certified under the Clinical Laboratory Improvement Amendments CLIA as qualified to perform high complexity clinical laboratory testing.    CIPROFLOXACIN  Value in next row Resistant      >=32 RESISTANTThis is a modified FDA-approved test that has been validated and its  performance characteristics determined by the reporting laboratory.  This laboratory is certified under the Clinical Laboratory Improvement Amendments CLIA as qualified to perform high complexity clinical laboratory testing.    GENTAMICIN Value in next row Sensitive      >=32 RESISTANTThis is a modified FDA-approved test that has been validated and its performance characteristics determined by the reporting laboratory.  This laboratory is certified under the Clinical Laboratory Improvement Amendments CLIA as qualified to perform high complexity clinical laboratory testing.    NITROFURANTOIN Value in next row Sensitive      >=32 RESISTANTThis is a modified FDA-approved test that has been validated and its performance characteristics determined by the reporting laboratory.  This laboratory is certified under the Clinical Laboratory Improvement Amendments CLIA as qualified to perform high complexity clinical laboratory testing.    TRIMETH /SULFA  Value in next row Sensitive      >=32 RESISTANTThis is a modified  FDA-approved test that has been validated and its performance characteristics determined by the reporting laboratory.  This laboratory is certified under the Clinical Laboratory Improvement Amendments CLIA as qualified to perform high complexity clinical laboratory testing.    AMPICILLIN/SULBACTAM Value in next row Resistant      >=32 RESISTANTThis is a modified FDA-approved test that has been validated and its performance characteristics determined by the reporting laboratory.  This laboratory is certified under the Clinical Laboratory Improvement Amendments CLIA as qualified to perform high complexity clinical laboratory testing.    PIP/TAZO Value in next row Sensitive      16 SENSITIVEThis is a modified FDA-approved test that has been validated and its performance characteristics determined by the reporting laboratory.  This laboratory is certified under the Clinical Laboratory Improvement Amendments  CLIA as qualified to perform high complexity clinical laboratory testing.    MEROPENEM Value in next row Sensitive      16 SENSITIVEThis is a modified FDA-approved test that has been validated and its performance characteristics determined by the reporting laboratory.  This laboratory is certified under the Clinical Laboratory Improvement Amendments CLIA as qualified to perform high complexity clinical laboratory testing.    * >=100,000 COLONIES/mL ESCHERICHIA COLI   Providencia stuartii - MIC*    AMPICILLIN Value in next row Resistant      16 SENSITIVEThis is a modified FDA-approved test that has been validated and its performance characteristics determined by the reporting laboratory.  This laboratory is certified under the Clinical Laboratory Improvement Amendments CLIA as qualified to perform high complexity clinical laboratory testing.    CEFEPIME  Value in next row Sensitive      16 SENSITIVEThis is a modified FDA-approved test that has been validated and its performance characteristics determined by the reporting laboratory.  This laboratory is certified under the Clinical Laboratory Improvement Amendments CLIA as qualified to perform high complexity clinical laboratory testing.    ERTAPENEM Value in next row Sensitive      16 SENSITIVEThis is a modified FDA-approved test that has been validated and its performance characteristics determined by the reporting laboratory.  This laboratory is certified under the Clinical Laboratory Improvement Amendments CLIA as qualified to perform high complexity clinical laboratory testing.    CEFTRIAXONE  Value in next row Sensitive      16 SENSITIVEThis is a modified FDA-approved test that has been validated and its performance characteristics determined by the reporting laboratory.  This laboratory is certified under the Clinical Laboratory Improvement Amendments CLIA as qualified to perform high complexity clinical laboratory testing.    CIPROFLOXACIN  Value in  next row Resistant      16 SENSITIVEThis is a modified FDA-approved test that has been validated and its performance characteristics determined by the reporting laboratory.  This laboratory is certified under the Clinical Laboratory Improvement Amendments CLIA as qualified to perform high complexity clinical laboratory testing.    GENTAMICIN Value in next row Resistant      16 SENSITIVEThis is a modified FDA-approved test that has been validated and its performance characteristics determined by the reporting laboratory.  This laboratory is certified under the Clinical Laboratory Improvement Amendments CLIA as qualified to perform high complexity clinical laboratory testing.    NITROFURANTOIN Value in next row Resistant      16 SENSITIVEThis is a modified FDA-approved test that has been validated and its performance characteristics determined by the reporting laboratory.  This laboratory is certified under the Clinical Laboratory Improvement Amendments CLIA as qualified to perform high complexity clinical  laboratory testing.    TRIMETH /SULFA  Value in next row Sensitive      16 SENSITIVEThis is a modified FDA-approved test that has been validated and its performance characteristics determined by the reporting laboratory.  This laboratory is certified under the Clinical Laboratory Improvement Amendments CLIA as qualified to perform high complexity clinical laboratory testing.    AMPICILLIN/SULBACTAM Value in next row Intermediate      16 SENSITIVEThis is a modified FDA-approved test that has been validated and its performance characteristics determined by the reporting laboratory.  This laboratory is certified under the Clinical Laboratory Improvement Amendments CLIA as qualified to perform high complexity clinical laboratory testing.    PIP/TAZO Value in next row Sensitive      <=4 SENSITIVEThis is a modified FDA-approved test that has been validated and its performance characteristics determined by the  reporting laboratory.  This laboratory is certified under the Clinical Laboratory Improvement Amendments CLIA as qualified to perform high complexity clinical laboratory testing.    MEROPENEM Value in next row Sensitive      <=4 SENSITIVEThis is a modified FDA-approved test that has been validated and its performance characteristics determined by the reporting laboratory.  This laboratory is certified under the Clinical Laboratory Improvement Amendments CLIA as qualified to perform high complexity clinical laboratory testing.    * >=100,000 COLONIES/mL PROVIDENCIA STUARTII  Culture, blood (routine x 2)     Status: None (Preliminary result)   Collection Time: 06/23/24 11:23 AM   Specimen: BLOOD  Result Value Ref Range Status   Specimen Description BLOOD BLOOD RIGHT HAND  Final   Special Requests   Final    BOTTLES DRAWN AEROBIC AND ANAEROBIC Blood Culture adequate volume   Culture   Final    NO GROWTH 4 DAYS Performed at [facility], [address] [place], [place] [address]    Report Status PENDING  Incomplete  Body fluid culture w Gram Stain     Status: None   Collection Time: 06/24/24  3:55 PM   Specimen: PATH Cytology Pleural fluid  Result Value Ref Range Status   Specimen Description   Final    PLEURAL Performed at [facility], [address], [place], [place] [address]    Special Requests   Final    NONE Performed at [facility], [address] [place], [place] [address]    Gram Stain   Final    WBC PRESENT,BOTH PMN AND MONONUCLEAR NO ORGANISMS SEEN CYTOSPIN SMEAR    Culture   Final    NO GROWTH 3 DAYS Performed at [facility], [address], [place], [place] [address]    Report Status 06/27/2024 FINAL  Final    Coagulation Studies: No results for input(s): LABPROT, INR in the last 72 hours.   Urinalysis: No results for input(s): COLORURINE, LABSPEC, PHURINE, GLUCOSEU, HGBUR, BILIRUBINUR, KETONESUR,  PROTEINUR, UROBILINOGEN, NITRITE, LEUKOCYTESUR in the last 72 hours.  Invalid input(s): APPERANCEUR     Imaging: DG Chest 1 View Result Date: 06/26/2024 EXAM: 1 VIEW XRAY OF THE CHEST 06/26/2024 04:41:00 PM COMPARISON: 06/24/2024 CLINICAL HISTORY: Pulmonary edema FINDINGS: LINES, TUBES AND DEVICES: Stable right internal jugular hemodialysis catheter. LUNGS AND PLEURA: No focal pulmonary opacity. No pulmonary edema. Small to moderate left and small right pleural effusions, unchanged. Bibasilar atelectasis. Stable mild diffuse interstitial prominence. No pneumothorax. HEART AND MEDIASTINUM: No acute abnormality of the cardiac and mediastinal silhouettes. BONES AND SOFT TISSUES: Thoracic fusion hardware noted. No acute osseous abnormality. IMPRESSION: 1.  Small to moderate left and small right pleural effusions, and bibasilar atelectasis. 2. Stable mild diffuse interstitial prominence Electronically signed by: Donnice Mania MD 06/26/2024 06:46 PM EDT RP Workstation: [id number]     Medications:      carvedilol   6.25 mg Oral BID WC   Chlorhexidine  Gluconate Cloth  6 each Topical Q0600   diltiazem   240 mg Oral QPM   Doxepin  HCl  6 mg Oral QHS   epoetin  alfa-epbx (RETACRIT ) injection  4,000 Units Intravenous Q M,W,F-1800   famotidine   10 mg Oral Daily   furosemide   80 mg Oral Daily   Gerhardt's butt cream   Topical TID   heparin   5,000 Units Subcutaneous Q8H   insulin  aspart  0-15 Units Subcutaneous TID WC   insulin  aspart  0-5 Units Subcutaneous QHS   insulin  aspart  5 Units Subcutaneous TID WC   insulin  glargine  12 Units Subcutaneous BID   lactulose   20 g Oral BID   levothyroxine   175 mcg Oral Q0600   linaclotide   145 mcg Oral QAC breakfast   melatonin  10 mg Oral QHS   acetaminophen  **OR** acetaminophen , alteplase , bisacodyl , bisacodyl , heparin , hydrALAZINE , nicotine , ondansetron  **OR** ondansetron  (ZOFRAN ) IV, oxyCODONE , oxyCODONE , polyethylene glycol  Assessment/ Plan:   [name] is a 63 y.o.  male with past medical conditions including diabetes, bilateral BKA, suprapubic catheter, hepatitis C, and end-stage renal disease on hemodialysis.  Patient presented to the emergency department from nursing facility with altered mental status and respiratory distress.  Patient currently admitted to ICU for Anasarca [R60.1] Severe sepsis with acute organ dysfunction (HCC) [A41.9, R65.20] Acute on chronic respiratory failure with hypoxia (HCC) [J96.21] Altered mental status, unspecified altered mental status type [R41.82] Pneumonia due to infectious organism, unspecified laterality, unspecified part of lung [J18.9]  [facility]/MWF/right PermCath  End-stage renal disease with hyperkalemia on hemodialysis.  Offered UF only treatment, which patient initially refused. But received call from nursing that patient was accepting. Will schedule 2.5 hour treatment with UF goal 2.5L as tolerated.  Next treatment scheduled for Monday  2.  Acute respiratory failure, patient placed on BiPAP on ED arrival.  Thoracentesis on 10/1 with 0.25L fluid removal.  Will order UF only treatment today.   3. Anemia of chronic kidney disease Lab Results  Component Value Date   HGB 9.3 (L) 06/26/2024    Hemoglobin borderline, continue Retacrit  4000 unit with dialysis  4. Diabetes mellitus type II with chronic kidney disease/renal manifestations: insulin  dependent. Home regimen includes glargine and lispro. Most recent hemoglobin A1c is 6.0 on 04/14/2024.   Primary team to continue management.     LOS: 5 [address] 10/4/202512:07 PM

## 2024-06-27 NOTE — Progress Notes (Signed)
 Progress Note   Patient: Eugene [name] DOB: [birth year] DOA: 06/22/2024     5 DOS: the patient was seen and examined on 06/27/2024   Brief hospital course: [name] is a 63 year old male with history of morbid obesity, end-stage renal disease on hemodialysis, insulin -dependent diabetes mellitus 2, hypothyroid, hypertension admitted for sepsis due to pneumonia. Hospital course as below  Assessment and Plan: Severe sepsis with acute organ dysfunction (HCC) Secondary to pneumonia Acute on chronic hypoxic respiratory failure - Presented with WBC 19.2, tachycardia - resolved - Patient required BiPAP on arrival but have been weaned off to high flow - CT scan of the chest showing findings consolidation in bilateral lower lobes, small partially loculated effusion on the left - s/p left sided Thoracentesis by IR removed , WBC present, no organisms seen.  Cultures NG.  High LDH, low protein, partially exudative - Continue IV Ceftriaxone , Azithromycin  - Follow up blood cultures - Wean oxygen as able   Elevated troponin likely secondary to demand ischemia in the setting of sepsis and hypoxic respiratory failure - S/p Heparin  gtt for 48 hrs - Seen by Cardiology, appreciate recs - Patient had complete echo on 04/19/2024, with estimated ejection fraction of 55 to 60%   HTN (hypertension) Continue Cardizem  240 mg, carvedilol  6.25 mg bid   Type 2 diabetes mellitus with hyperglycemia, with long-term current use of insulin  (HCC) HbA1c 7.3 Home long-acting insulin , inc 14 units twice daily, meal time Novolog  5u TID SSI   ESRD on hemodialysis - Nephrology is consulted for dialysis needs - CXR with mild pleural effusion and diffuse interstitial pulmonary - Underwent HD Friday 10/03, another run today 10/04  Normocytic anemia - EPO per Nephrology  Chronic diastolic CHF - Echo in July showed EF 55 to 60%. - Resume Lasix  10/03 - SGLT2 inhibitor not indicated in  patients with dialysis  S/p bilateral BKA - No acute concerns  Suprapubic catheter - denies any symptoms - Ucx was sent on adm, growing E. coli, Providencia stuartii - likely colonization   Obesity, Class III, BMI 40-49.9 (morbid obesity) This complicates overall care and prognosis.    Hypothyroidism Levothyroxine  175 mcg daily resumed   Tobacco use disorder As needed nicotine  patch ordered   Chronic pain syndrome Continue home oxycodone  bowel regimen for constipation  Gluteal cleft pressure ulcer WOC consult   DVT prophylaxis: Heparin  SQ Code Status: DNR/DNI   Subjective:  Patient seen and examined at bedside Reports mild SOB when laying flat, underwent HD yesterday and repeat HD today by Nephrology  Physical Exam: Respiratory: clear to auscultation bilaterally, no wheezing, no crackles. Normal respiratory effort. No accessory muscle use.  Cardiovascular: Regular rate and rhythm, no murmurs / rubs / gallops.  Generalized extremity edema. 2+ pedal pulses. No carotid bruits.  Abdomen: Morbidly obese abdomen, no tenderness, no masses palpated, no hepatosplenomegaly. Bowel sounds positive.  Musculoskeletal: no clubbing / cyanosis. No joint deformity upper and lower extremities. Good ROM, no contractures, no atrophy. Normal muscle tone.  Skin: no rashes, lesions, ulcers. No induration Neurologic: Sensation intact. Strength 5/5 in all 4.  Psychiatric: Normal judgment and insight.     Vitals:   06/27/24 1500 06/27/24 1530 06/27/24 1539 06/27/24 1542  BP: 130/77 (!) 152/86 (!) 165/92 (!) 165/92  Pulse: 65 73 66 65  Resp: 14 16 16 15   Temp:   97.9 F (36.6 C)   TempSrc:   Oral   SpO2: 100% 100% 99% 98%  Weight:    124.8 kg  Height:        Data Reviewed:  I have reviewed patient's CT scan as shown above I have also reviewed below mentioned labs    Latest Ref Rng & Units 06/26/2024    4:20 AM 06/25/2024    5:00 AM 06/24/2024    5:05 AM  BMP  Glucose 70 - 99 mg/dL  768  716  633   BUN 8 - 23 mg/dL 42  41  65   Creatinine 0.61 - 1.24 mg/dL 6.47  6.48  4.87   Sodium 135 - 145 mmol/L 134  132  134   Potassium 3.5 - 5.1 mmol/L 4.5  3.7  5.1   Chloride 98 - 111 mmol/L 100  99  99   CO2 22 - 32 mmol/L 25  25  25    Calcium  8.9 - 10.3 mg/dL 7.4  7.3  7.4        Latest Ref Rng & Units 06/26/2024    8:45 AM 06/25/2024    5:00 AM 06/24/2024   10:10 AM  CBC  WBC 4.0 - 10.5 K/uL 5.6  8.9  12.8   Hemoglobin 13.0 - 17.0 g/dL 9.3  9.1  8.7   Hematocrit 39.0 - 52.0 % 29.7  29.3  27.7   Platelets 150 - 400 K/uL 179  186  205     Family Communication: No family present at bedside  Disposition: Pending clinical course  Author: Laree Lock, MD 06/27/2024 4:17 PM  For on call review [email or web address].

## 2024-06-27 NOTE — Plan of Care (Signed)
  Problem: Coping: Goal: Ability to adjust to condition or change in health will improve Outcome: Progressing   Problem: Skin Integrity: Goal: Risk for impaired skin integrity will decrease Outcome: Progressing   Problem: Nutritional: Goal: Maintenance of adequate nutrition will improve Outcome: Progressing   Problem: Metabolic: Goal: Ability to maintain appropriate glucose levels will improve Outcome: Progressing

## 2024-06-27 NOTE — Progress Notes (Signed)
 3 liters ultrafiltration, post ultrafiltration condition stable, and report was given to the primary RN.

## 2024-06-28 ENCOUNTER — Inpatient Hospital Stay

## 2024-06-28 DIAGNOSIS — R6520 Severe sepsis without septic shock: Secondary | ICD-10-CM | POA: Diagnosis not present

## 2024-06-28 DIAGNOSIS — A419 Sepsis, unspecified organism: Secondary | ICD-10-CM | POA: Diagnosis not present

## 2024-06-28 LAB — BASIC METABOLIC PANEL WITH GFR
Anion gap: 7 (ref 5–15)
BUN: 31 mg/dL — ABNORMAL HIGH (ref 8–23)
CO2: 26 mmol/L (ref 22–32)
Calcium: 7.6 mg/dL — ABNORMAL LOW (ref 8.9–10.3)
Chloride: 103 mmol/L (ref 98–111)
Creatinine, Ser: 3.01 mg/dL — ABNORMAL HIGH (ref 0.61–1.24)
GFR, Estimated: 23 mL/min — ABNORMAL LOW (ref >60)
Glucose, Bld: 209 mg/dL — ABNORMAL HIGH (ref 70–99)
Potassium: 4.5 mmol/L (ref 3.5–5.1)
Sodium: 136 mmol/L (ref 135–145)

## 2024-06-28 LAB — GLUCOSE, CAPILLARY
Glucose-Capillary: 245 mg/dL — ABNORMAL HIGH (ref 70–99)
Glucose-Capillary: 206 mg/dL — ABNORMAL HIGH (ref 70–99)
Glucose-Capillary: 280 mg/dL — ABNORMAL HIGH (ref 70–99)
Glucose-Capillary: 193 mg/dL — ABNORMAL HIGH (ref 70–99)

## 2024-06-28 LAB — CULTURE, BLOOD (ROUTINE X 2)
Culture: NO GROWTH
Special Requests: ADEQUATE

## 2024-06-28 NOTE — Plan of Care (Signed)

## 2024-06-28 NOTE — Plan of Care (Signed)
   Problem: Coping: Goal: Ability to adjust to condition or change in health will improve Outcome: Progressing   Problem: Metabolic: Goal: Ability to maintain appropriate glucose levels will improve Outcome: Progressing   Problem: Skin Integrity: Goal: Risk for impaired skin integrity will decrease Outcome: Progressing

## 2024-06-28 NOTE — Progress Notes (Signed)
 Progress Note   Patient: Eugene [name] FMW:[id number] DOB: [birth year] DOA: 06/22/2024     6 DOS: the patient was seen and examined on 06/28/2024   Brief hospital course: [name] is a 63 year old male with history of morbid obesity, end-stage renal disease on hemodialysis, insulin -dependent diabetes mellitus 2, hypothyroid, hypertension admitted for sepsis due to pneumonia. Hospital course as below  Assessment and Plan: Severe sepsis with acute organ dysfunction (HCC) Secondary to pneumonia Acute on chronic hypoxic respiratory failure - Presented with WBC 19.2, tachycardia - resolved - Patient required BiPAP on arrival but have been weaned off to high flow - CT scan of the chest showing findings consolidation in bilateral lower lobes, small partially loculated effusion on the left - s/p left sided Thoracentesis on 10/01 by IR removed , WBC present, no organisms seen.  Cultures NG.  High LDH, low protein, partially exudative - completed course Ceftriaxone , Azithromycin  - Follow up blood cultures - Wean oxygen as able   Elevated troponin likely secondary to demand ischemia in the setting of sepsis and hypoxic respiratory failure - S/p Heparin  gtt for 48 hrs - Seen by Cardiology, appreciate recs - Patient had complete echo on 04/19/2024, with estimated ejection fraction of 55 to 60%   ESRD on hemodialysis - Nephrology is consulted for dialysis needs, MWF - CXR with mild pleural effusion and diffuse interstitial pulmonary - Underwent HD Friday 10/03, another run Saturday 10/04 (3L removed) due to fluid overload - Fluid restriction 1200cc/day  Normocytic anemia - EPO per Nephrology  Chronic diastolic CHF - Echo in July showed EF 55 to 60%. - Resume Lasix  10/03 - SGLT2 inhibitor not indicated in patients with dialysis  HTN (hypertension) Continue Cardizem  240 mg, carvedilol  6.25 mg bid   Type 2 diabetes mellitus with hyperglycemia, with long-term current use of  insulin  (HCC) HbA1c 7.3 Home long-acting insulin , inc 14 units twice daily, meal time Novolog  5u TID SSI  S/p bilateral BKA - No acute concerns  Suprapubic catheter - denies any symptoms - Ucx was sent on adm, growing E. coli, Providencia stuartii - likely colonization   Obesity, Class III, BMI 40-49.9 (morbid obesity) This complicates overall care and prognosis.    Hypothyroidism Levothyroxine  175 mcg daily resumed   Tobacco use disorder As needed nicotine  patch ordered   Chronic pain syndrome Continue home oxycodone  bowel regimen for constipation  Gluteal cleft pressure ulcer WOC consult   DVT prophylaxis: Heparin  SQ Code Status: DNR/DNI   Subjective:  Patient seen and examined at bedside Reports SOB with laying flat, underwent extra run dialysis yday, repeat cxr  Physical Exam: Respiratory: clear to auscultation bilaterally, no wheezing, no crackles. Normal respiratory effort. No accessory muscle use.  Cardiovascular: Regular rate and rhythm, no murmurs / rubs / gallops.  Generalized extremity edema. 2+ pedal pulses. No carotid bruits.  Abdomen: Morbidly obese abdomen, no tenderness, no masses palpated, no hepatosplenomegaly. Bowel sounds positive.  Musculoskeletal: no clubbing / cyanosis. No joint deformity upper and lower extremities. Good ROM, no contractures, no atrophy. Normal muscle tone.  Skin: no rashes, lesions, ulcers. No induration Neurologic: Sensation intact. Strength 5/5 in all 4.  Psychiatric: Normal judgment and insight.     Vitals:   06/28/24 9166 06/28/24 1218 06/28/24 1258 06/28/24 1608  BP: (!) 156/77 (!) 161/77  (!) 164/91  Pulse: 73 70  79  Resp:      Temp: 98.5 F (36.9 C) 98.7 F (37.1 C)  98.6 F (37 C)  TempSrc:  SpO2: 97% 96% 94% 96%  Weight:      Height:        Data Reviewed:  I have reviewed patient's CT scan as shown above I have also reviewed below mentioned labs    Latest Ref Rng & Units 06/28/2024    4:22 AM  06/26/2024    4:20 AM 06/25/2024    5:00 AM  BMP  Glucose 70 - 99 mg/dL 790  768  716   BUN 8 - 23 mg/dL 31  42  41   Creatinine 0.61 - 1.24 mg/dL 6.98  6.47  6.48   Sodium 135 - 145 mmol/L 136  134  132   Potassium 3.5 - 5.1 mmol/L 4.5  4.5  3.7   Chloride 98 - 111 mmol/L 103  100  99   CO2 22 - 32 mmol/L 26  25  25    Calcium  8.9 - 10.3 mg/dL 7.6  7.4  7.3        Latest Ref Rng & Units 06/26/2024    8:45 AM 06/25/2024    5:00 AM 06/24/2024   10:10 AM  CBC  WBC 4.0 - 10.5 K/uL 5.6  8.9  12.8   Hemoglobin 13.0 - 17.0 g/dL 9.3  9.1  8.7   Hematocrit 39.0 - 52.0 % 29.7  29.3  27.7   Platelets 150 - 400 K/uL 179  186  205     Family Communication: No family present at bedside  Disposition: Pending clinical course  Author: Laree Lock, MD 06/28/2024 4:18 PM  For on call review [email or web address].

## 2024-06-28 NOTE — Progress Notes (Signed)
 [facility]  ROUNDING NOTE   Subjective:   Eugene [name] is a 63 y.o. male with past medical conditions including diabetes, bilateral BKA, suprapubic catheter, hepatitis C, and end-stage renal disease on hemodialysis.  Patient presented to the emergency department from nursing facility with altered mental status and respiratory distress.  Patient currently admitted to ICU for Anasarca [R60.1] Severe sepsis with acute organ dysfunction (HCC) [A41.9, R65.20] Acute on chronic respiratory failure with hypoxia (HCC) [J96.21] Altered mental status, unspecified altered mental status type [R41.82] Pneumonia due to infectious organism, unspecified laterality, unspecified part of lung [J18.9]  Patient is known to our practice and receives outpatient dialysis treatments at [facility] on a MWF schedule, supervised by Dr. Dennise.   Update:  Patient seen resting in bed Alert States his breathing status has not improved.  Dies not appear in distress.   Objective:  Vital signs in last 24 hours:  Temp:  [97.9 F (36.6 C)-98.7 F (37.1 C)] 98.7 F (37.1 C) (10/05 1218) Pulse Rate:  [61-73] 70 (10/05 1218) Resp:  [12-20] 18 (10/05 0434) BP: (130-165)/(67-92) 161/77 (10/05 1218) SpO2:  [94 %-100 %] 94 % (10/05 1258) Weight:  [124.8 kg] 124.8 kg (10/04 1542)  Weight change: -1.7 kg Filed Weights   06/27/24 1227 06/27/24 1255 06/27/24 1542  Weight: 127.8 kg 127.8 kg 124.8 kg    Intake/Output: I/O last 3 completed shifts: In: 150 [P.O.:150] Out: 5450 [Urine:2450; Other:3000]   Intake/Output this shift:  No intake/output data recorded.  Physical Exam: General: NAD  Head: Normocephalic, atraumatic. Moist oral mucosal membranes  Eyes: Anicteric  Lungs:  Diminished, HFNC  Heart: Regular rate and rhythm  Abdomen:  Soft, nontender, obese  Extremities: 1+ peripheral edema.  Bilateral AKA  Neurologic: Awake, alert, conversant  Skin: Warm,dry, no rash  Access: Right IJ  PermCath    Basic Metabolic Panel: Recent Labs  Lab 06/22/24 1553 06/23/24 0347 06/24/24 0505 06/25/24 0500 06/26/24 0420 06/28/24 0422  NA 133* 131* 134* 132* 134* 136  K 5.4* 4.7 5.1 3.7 4.5 4.5  CL 99 97* 99 99 100 103  CO2 20* 23 25 25 25 26   GLUCOSE 335* 373* 366* 283* 231* 209*  BUN 63* 51* 65* 41* 42* 31*  CREATININE 6.07* 4.65* 5.12* 3.51* 3.52* 3.01*  CALCIUM  7.7* 7.5* 7.4* 7.3* 7.4* 7.6*  MG 2.5*  --   --   --   --   --   PHOS 7.1*  --   --   --   --   --     Liver Function Tests: Recent Labs  Lab 06/22/24 1553  AST 114*  ALT 102*  ALKPHOS 70  BILITOT 0.6  PROT 7.0  ALBUMIN  2.4*   No results for input(s): LIPASE, AMYLASE in the last 168 hours. No results for input(s): AMMONIA in the last 168 hours.  CBC: Recent Labs  Lab 06/22/24 1553 06/23/24 0347 06/24/24 0505 06/24/24 1010 06/25/24 0500 06/26/24 0845  WBC 15.1* 19.2* 9.6 12.8* 8.9 5.6  NEUTROABS 11.4*  --   --   --   --   --   HGB 9.7* 9.1* 9.0* 8.7* 9.1* 9.3*  HCT 31.7* 29.9* 29.7* 27.7* 29.3* 29.7*  MCV 96.4 95.5 96.1 96.2 94.8 95.8  PLT 214 210 201 205 186 179    Cardiac Enzymes: No results for input(s): CKTOTAL, CKMB, CKMBINDEX, TROPONINI in the last 168 hours.  BNP: Invalid input(s): POCBNP  CBG: Recent Labs  Lab 06/27/24 1204 06/27/24 1650 06/27/24 2030  06/28/24 0830 06/28/24 1213  GLUCAP 234* 282* 256* 245* 206*    Microbiology: Results for orders placed or performed during the hospital encounter of 06/22/24  Culture, blood (routine x 2)     Status: None   Collection Time: 06/22/24  3:53 PM   Specimen: BLOOD  Result Value Ref Range Status   Specimen Description BLOOD BLOOD LEFT ARM  Final   Special Requests   Final    BOTTLES DRAWN AEROBIC AND ANAEROBIC Blood Culture adequate volume   Culture   Final    NO GROWTH 5 DAYS Performed at [facility], [address] [place], [place] [address]    Report Status 06/27/2024 FINAL  Final  Resp  panel by RT-PCR (RSV, Flu A&B, Covid) Anterior Nasal Swab     Status: None   Collection Time: 06/22/24  3:53 PM   Specimen: Anterior Nasal Swab  Result Value Ref Range Status   SARS Coronavirus 2 by RT PCR NEGATIVE NEGATIVE Final    Comment: (NOTE) SARS-CoV-2 target nucleic acids are NOT DETECTED.  The SARS-CoV-2 RNA is generally detectable in upper respiratory specimens during the acute phase of infection. The lowest concentration of SARS-CoV-2 viral copies this assay can detect is 138 copies/mL. A negative result does not preclude SARS-Cov-2 infection and should not be used as the sole basis for treatment or other patient management decisions. A negative result may occur with  improper specimen collection/handling, submission of specimen other than nasopharyngeal swab, presence of viral mutation(s) within the areas targeted by this assay, and inadequate number of viral copies(<138 copies/mL). A negative result must be combined with clinical observations, patient history, and epidemiological information. The expected result is Negative.  Fact Sheet for Patients:  BloggerCourse.com  Fact Sheet for Healthcare Providers:  SeriousBroker.it  This test is no t yet approved or cleared by the [place]  FDA and  has been authorized for detection and/or diagnosis of SARS-CoV-2 by FDA under an Emergency Use Authorization (EUA). This EUA will remain  in effect (meaning this test can be used) for the duration of the COVID-19 declaration under Section 564(b)(1) of the Act, 21 U.S.C.section 360bbb-3(b)(1), unless the authorization is terminated  or revoked sooner.       Influenza A by PCR NEGATIVE NEGATIVE Final   Influenza B by PCR NEGATIVE NEGATIVE Final    Comment: (NOTE) The Xpert Xpress SARS-CoV-2/FLU/RSV plus assay is intended as an aid in the diagnosis of influenza from Nasopharyngeal swab specimens and should not be used as a  sole basis for treatment. Nasal washings and aspirates are unacceptable for Xpert Xpress SARS-CoV-2/FLU/RSV testing.  Fact Sheet for Patients: BloggerCourse.com  Fact Sheet for Healthcare Providers: SeriousBroker.it  This test is not yet approved or cleared by the [place]  FDA and has been authorized for detection and/or diagnosis of SARS-CoV-2 by FDA under an Emergency Use Authorization (EUA). This EUA will remain in effect (meaning this test can be used) for the duration of the COVID-19 declaration under Section 564(b)(1) of the Act, 21 U.S.C. section 360bbb-3(b)(1), unless the authorization is terminated or revoked.     Resp Syncytial Virus by PCR NEGATIVE NEGATIVE Final    Comment: (NOTE) Fact Sheet for Patients: BloggerCourse.com  Fact Sheet for Healthcare Providers: SeriousBroker.it  This test is not yet approved or cleared by the [place]  FDA and has been authorized for detection and/or diagnosis of SARS-CoV-2 by FDA under an Emergency Use Authorization (EUA). This EUA will remain in effect (meaning this test can  be used) for the duration of the COVID-19 declaration under Section 564(b)(1) of the Act, 21 U.S.C. section 360bbb-3(b)(1), unless the authorization is terminated or revoked.  Performed at [facility] [address] [place], [place] [address]   Urine Culture     Status: Abnormal   Collection Time: 06/22/24  4:00 PM   Specimen: Urine, Suprapubic  Result Value Ref Range Status   Specimen Description   Final    URINE, SUPRAPUBIC Performed at [facility], [address], [place], [place] [address]    Special Requests   Final    NONE Performed at [facility], [address] [place], [place] [address]    Culture (A)  Final    >=100,000 COLONIES/mL ESCHERICHIA COLI >=100,000 COLONIES/mL PROVIDENCIA  STUARTII Confirmed Extended Spectrum Beta-Lactamase Producer (ESBL).  In bloodstream infections from ESBL organisms, carbapenems are preferred over piperacillin /tazobactam. They are shown to have a lower risk of mortality.    Report Status 06/25/2024 FINAL  Final   Organism ID, Bacteria PROVIDENCIA STUARTII (A)  Final   Organism ID, Bacteria ESCHERICHIA COLI (A)  Final      Susceptibility   Escherichia coli - MIC*    AMPICILLIN >=32 RESISTANT Resistant     CEFAZOLIN  (URINE) Value in next row Resistant      >=32 RESISTANTThis is a modified FDA-approved test that has been validated and its performance characteristics determined by the reporting laboratory.  This laboratory is certified under the Clinical Laboratory Improvement Amendments CLIA as qualified to perform high complexity clinical laboratory testing.    CEFEPIME  Value in next row Resistant      >=32 RESISTANTThis is a modified FDA-approved test that has been validated and its performance characteristics determined by the reporting laboratory.  This laboratory is certified under the Clinical Laboratory Improvement Amendments CLIA as qualified to perform high complexity clinical laboratory testing.    ERTAPENEM Value in next row Sensitive      >=32 RESISTANTThis is a modified FDA-approved test that has been validated and its performance characteristics determined by the reporting laboratory.  This laboratory is certified under the Clinical Laboratory Improvement Amendments CLIA as qualified to perform high complexity clinical laboratory testing.    CEFTRIAXONE  Value in next row Resistant      >=32 RESISTANTThis is a modified FDA-approved test that has been validated and its performance characteristics determined by the reporting laboratory.  This laboratory is certified under the Clinical Laboratory Improvement Amendments CLIA as qualified to perform high complexity clinical laboratory testing.    CIPROFLOXACIN  Value in next row Resistant       >=32 RESISTANTThis is a modified FDA-approved test that has been validated and its performance characteristics determined by the reporting laboratory.  This laboratory is certified under the Clinical Laboratory Improvement Amendments CLIA as qualified to perform high complexity clinical laboratory testing.    GENTAMICIN Value in next row Sensitive      >=32 RESISTANTThis is a modified FDA-approved test that has been validated and its performance characteristics determined by the reporting laboratory.  This laboratory is certified under the Clinical Laboratory Improvement Amendments CLIA as qualified to perform high complexity clinical laboratory testing.    NITROFURANTOIN Value in next row Sensitive      >=32 RESISTANTThis is a modified FDA-approved test that has been validated and its performance characteristics determined by the reporting laboratory.  This laboratory is certified under the Clinical Laboratory Improvement Amendments CLIA as qualified to perform high complexity clinical laboratory testing.  TRIMETH /SULFA  Value in next row Sensitive      >=32 RESISTANTThis is a modified FDA-approved test that has been validated and its performance characteristics determined by the reporting laboratory.  This laboratory is certified under the Clinical Laboratory Improvement Amendments CLIA as qualified to perform high complexity clinical laboratory testing.    AMPICILLIN/SULBACTAM Value in next row Resistant      >=32 RESISTANTThis is a modified FDA-approved test that has been validated and its performance characteristics determined by the reporting laboratory.  This laboratory is certified under the Clinical Laboratory Improvement Amendments CLIA as qualified to perform high complexity clinical laboratory testing.    PIP/TAZO Value in next row Sensitive      16 SENSITIVEThis is a modified FDA-approved test that has been validated and its performance characteristics determined by the reporting laboratory.   This laboratory is certified under the Clinical Laboratory Improvement Amendments CLIA as qualified to perform high complexity clinical laboratory testing.    MEROPENEM Value in next row Sensitive      16 SENSITIVEThis is a modified FDA-approved test that has been validated and its performance characteristics determined by the reporting laboratory.  This laboratory is certified under the Clinical Laboratory Improvement Amendments CLIA as qualified to perform high complexity clinical laboratory testing.    * >=100,000 COLONIES/mL ESCHERICHIA COLI   Providencia stuartii - MIC*    AMPICILLIN Value in next row Resistant      16 SENSITIVEThis is a modified FDA-approved test that has been validated and its performance characteristics determined by the reporting laboratory.  This laboratory is certified under the Clinical Laboratory Improvement Amendments CLIA as qualified to perform high complexity clinical laboratory testing.    CEFEPIME  Value in next row Sensitive      16 SENSITIVEThis is a modified FDA-approved test that has been validated and its performance characteristics determined by the reporting laboratory.  This laboratory is certified under the Clinical Laboratory Improvement Amendments CLIA as qualified to perform high complexity clinical laboratory testing.    ERTAPENEM Value in next row Sensitive      16 SENSITIVEThis is a modified FDA-approved test that has been validated and its performance characteristics determined by the reporting laboratory.  This laboratory is certified under the Clinical Laboratory Improvement Amendments CLIA as qualified to perform high complexity clinical laboratory testing.    CEFTRIAXONE  Value in next row Sensitive      16 SENSITIVEThis is a modified FDA-approved test that has been validated and its performance characteristics determined by the reporting laboratory.  This laboratory is certified under the Clinical Laboratory Improvement Amendments CLIA as qualified  to perform high complexity clinical laboratory testing.    CIPROFLOXACIN  Value in next row Resistant      16 SENSITIVEThis is a modified FDA-approved test that has been validated and its performance characteristics determined by the reporting laboratory.  This laboratory is certified under the Clinical Laboratory Improvement Amendments CLIA as qualified to perform high complexity clinical laboratory testing.    GENTAMICIN Value in next row Resistant      16 SENSITIVEThis is a modified FDA-approved test that has been validated and its performance characteristics determined by the reporting laboratory.  This laboratory is certified under the Clinical Laboratory Improvement Amendments CLIA as qualified to perform high complexity clinical laboratory testing.    NITROFURANTOIN Value in next row Resistant      16 SENSITIVEThis is a modified FDA-approved test that has been validated and its performance characteristics determined by the reporting laboratory.  This laboratory  is certified under the Clinical Laboratory Improvement Amendments CLIA as qualified to perform high complexity clinical laboratory testing.    TRIMETH /SULFA  Value in next row Sensitive      16 SENSITIVEThis is a modified FDA-approved test that has been validated and its performance characteristics determined by the reporting laboratory.  This laboratory is certified under the Clinical Laboratory Improvement Amendments CLIA as qualified to perform high complexity clinical laboratory testing.    AMPICILLIN/SULBACTAM Value in next row Intermediate      16 SENSITIVEThis is a modified FDA-approved test that has been validated and its performance characteristics determined by the reporting laboratory.  This laboratory is certified under the Clinical Laboratory Improvement Amendments CLIA as qualified to perform high complexity clinical laboratory testing.    PIP/TAZO Value in next row Sensitive      <=4 SENSITIVEThis is a modified FDA-approved  test that has been validated and its performance characteristics determined by the reporting laboratory.  This laboratory is certified under the Clinical Laboratory Improvement Amendments CLIA as qualified to perform high complexity clinical laboratory testing.    MEROPENEM Value in next row Sensitive      <=4 SENSITIVEThis is a modified FDA-approved test that has been validated and its performance characteristics determined by the reporting laboratory.  This laboratory is certified under the Clinical Laboratory Improvement Amendments CLIA as qualified to perform high complexity clinical laboratory testing.    * >=100,000 COLONIES/mL PROVIDENCIA STUARTII  Culture, blood (routine x 2)     Status: None   Collection Time: 06/23/24 11:23 AM   Specimen: BLOOD  Result Value Ref Range Status   Specimen Description BLOOD BLOOD RIGHT HAND  Final   Special Requests   Final    BOTTLES DRAWN AEROBIC AND ANAEROBIC Blood Culture adequate volume   Culture   Final    NO GROWTH 5 DAYS Performed at [facility], [address] [place], [place] [address]    Report Status 06/28/2024 FINAL  Final  Body fluid culture w Gram Stain     Status: None   Collection Time: 06/24/24  3:55 PM   Specimen: PATH Cytology Pleural fluid  Result Value Ref Range Status   Specimen Description   Final    PLEURAL Performed at [facility], [address] [place], [place] [address]    Special Requests   Final    NONE Performed at [facility], [address] [place], [place] [address]    Gram Stain   Final    WBC PRESENT,BOTH PMN AND MONONUCLEAR NO ORGANISMS SEEN CYTOSPIN SMEAR    Culture   Final    NO GROWTH 3 DAYS Performed at [facility], [address], [place], [place] [address]    Report Status 06/27/2024 FINAL  Final    Coagulation Studies: No results for input(s): LABPROT, INR in the last 72 hours.   Urinalysis: No results for input(s): COLORURINE, LABSPEC,  PHURINE, GLUCOSEU, HGBUR, BILIRUBINUR, KETONESUR, PROTEINUR, UROBILINOGEN, NITRITE, LEUKOCYTESUR in the last 72 hours.  Invalid input(s): APPERANCEUR     Imaging: DG Chest 1 View Result Date: 06/26/2024 EXAM: 1 VIEW XRAY OF THE CHEST 06/26/2024 04:41:00 PM COMPARISON: 06/24/2024 CLINICAL HISTORY: Pulmonary edema FINDINGS: LINES, TUBES AND DEVICES: Stable right internal jugular hemodialysis catheter. LUNGS AND PLEURA: No focal pulmonary opacity. No pulmonary edema. Small to moderate left and small right pleural effusions, unchanged. Bibasilar atelectasis. Stable mild diffuse interstitial prominence. No pneumothorax. HEART AND MEDIASTINUM: No acute abnormality of the cardiac and mediastinal  silhouettes. BONES AND SOFT TISSUES: Thoracic fusion hardware noted. No acute osseous abnormality. IMPRESSION: 1. Small to moderate left and small right pleural effusions, and bibasilar atelectasis. 2. Stable mild diffuse interstitial prominence Electronically signed by: Donnice Mania MD 06/26/2024 06:46 PM EDT RP Workstation: [id number]     Medications:      carvedilol   6.25 mg Oral BID WC   Chlorhexidine  Gluconate Cloth  6 each Topical Q0600   diltiazem   240 mg Oral QPM   epoetin  alfa-epbx (RETACRIT ) injection  4,000 Units Intravenous Q M,W,F-1800   famotidine   10 mg Oral Daily   furosemide   80 mg Oral Daily   Gerhardt's butt cream   Topical TID   heparin   5,000 Units Subcutaneous Q8H   insulin  aspart  0-15 Units Subcutaneous TID WC   insulin  aspart  0-5 Units Subcutaneous QHS   insulin  aspart  5 Units Subcutaneous TID WC   insulin  glargine  14 Units Subcutaneous BID   lactulose   20 g Oral BID   levothyroxine   175 mcg Oral Q0600   linaclotide   145 mcg Oral QAC breakfast   melatonin  10 mg Oral QHS   bisacodyl , bisacodyl , oxyCODONE , oxyCODONE , polyethylene glycol  Assessment/ Plan:  [name] is a 63 y.o.  male with past medical conditions including diabetes,  bilateral BKA, suprapubic catheter, hepatitis C, and end-stage renal disease on hemodialysis.  Patient presented to the emergency department from nursing facility with altered mental status and respiratory distress.  Patient currently admitted to ICU for Anasarca [R60.1] Severe sepsis with acute organ dysfunction (HCC) [A41.9, R65.20] Acute on chronic respiratory failure with hypoxia (HCC) [J96.21] Altered mental status, unspecified altered mental status type [R41.82] Pneumonia due to infectious organism, unspecified laterality, unspecified part of lung [J18.9]  [facility]/MWF/right PermCath  End-stage renal disease with hyperkalemia on hemodialysis.  Received UF only treatment with 3L fluid removal.  Next treatment scheduled for Monday  2.  Acute respiratory failure, patient placed on BiPAP on ED arrival.  Thoracentesis on 10/1 with 0.25L fluid removal.  Was able to remove 3L.   3. Anemia of chronic kidney disease Lab Results  Component Value Date   HGB 9.3 (L) 06/26/2024    Hemoglobin borderline, continue Retacrit  4000 unit with dialysis  4. Diabetes mellitus type II with chronic kidney disease/renal manifestations: insulin  dependent. Home regimen includes glargine and lispro. Most recent hemoglobin A1c is 6.0 on 04/14/2024.   Primary team to continue management.     LOS: 6 Mar Walmer 10/5/20251:55 PM

## 2024-06-28 NOTE — Progress Notes (Signed)
 Patient ID: Eugene [name], male   DOB: 10/20/1960, 63 y.o.   MRN: [id number] [facility]    SUBJECTIVE: Patient resting comfortably in bed denies any pain no shortness of breath.  Patient still with chronic pain shortness of breath somewhat improved wearing a Nitropatch to help with smoking cessation   Vitals:   06/28/24 0434 06/28/24 0833 06/28/24 1218 06/28/24 1258  BP: (!) 140/71 (!) 156/77 (!) 161/77   Pulse: 64 73 70   Resp: 18     Temp: 98.2 F (36.8 C) 98.5 F (36.9 C) 98.7 F (37.1 C)   TempSrc: Oral     SpO2: 98% 97% 96% 94%  Weight:      Height:         Intake/Output Summary (Last 24 hours) at 06/28/2024 1507 Last data filed at 06/28/2024 1426 Gross per 24 hour  Intake 270 ml  Output 3900 ml  Net -3630 ml      PHYSICAL EXAM  General: Well developed, well nourished, in no acute distress HEENT:  Normocephalic and atramatic Neck:  No JVD.  Lungs: Clear bilaterally to auscultation and percussion. Heart: HRRR . Normal S1 and S2 without gallops or murmurs.  Abdomen: Bowel sounds are positive, abdomen soft and non-tender  Msk:  Back normal, normal gait. Normal strength and tone for age. Extremities: No clubbing, cyanosis or edema.  Bilateral BKA's Neuro: Alert and oriented X 3. Psych:  Good affect, responds appropriately   LABS: Basic Metabolic Panel: Recent Labs    06/26/24 0420 06/28/24 0422  NA 134* 136  K 4.5 4.5  CL 100 103  CO2 25 26  GLUCOSE 231* 209*  BUN 42* 31*  CREATININE 3.52* 3.01*  CALCIUM  7.4* 7.6*   Liver Function Tests: No results for input(s): AST, ALT, ALKPHOS, BILITOT, PROT, ALBUMIN  in the last 72 hours. No results for input(s): LIPASE, AMYLASE in the last 72 hours. CBC: Recent Labs    06/26/24 0845  WBC 5.6  HGB 9.3*  HCT 29.7*  MCV 95.8  PLT 179   Cardiac Enzymes: No results for input(s): CKTOTAL, CKMB, CKMBINDEX, TROPONINI in the last 72 hours. BNP: Invalid input(s): POCBNP D-Dimer: No  results for input(s): DDIMER in the last 72 hours. Hemoglobin A1C: No results for input(s): HGBA1C in the last 72 hours. Fasting Lipid Panel: No results for input(s): CHOL, HDL, LDLCALC, TRIG, CHOLHDL, LDLDIRECT in the last 72 hours. Thyroid Function Tests: No results for input(s): TSH, T4TOTAL, T3FREE, THYROIDAB in the last 72 hours.  Invalid input(s): FREET3 Anemia Panel: No results for input(s): VITAMINB12, FOLATE, FERRITIN, TIBC, IRON , RETICCTPCT in the last 72 hours.  DG Chest 1 View Result Date: 06/26/2024 EXAM: 1 VIEW XRAY OF THE CHEST 06/26/2024 04:41:00 PM COMPARISON: 06/24/2024 CLINICAL HISTORY: Pulmonary edema FINDINGS: LINES, TUBES AND DEVICES: Stable right internal jugular hemodialysis catheter. LUNGS AND PLEURA: No focal pulmonary opacity. No pulmonary edema. Small to moderate left and small right pleural effusions, unchanged. Bibasilar atelectasis. Stable mild diffuse interstitial prominence. No pneumothorax. HEART AND MEDIASTINUM: No acute abnormality of the cardiac and mediastinal silhouettes. BONES AND SOFT TISSUES: Thoracic fusion hardware noted. No acute osseous abnormality. IMPRESSION: 1. Small to moderate left and small right pleural effusions, and bibasilar atelectasis. 2. Stable mild diffuse interstitial prominence Electronically signed by: Donnice Mania MD 06/26/2024 06:46 PM EDT RP Workstation: [id number]     Echo normal sinus rhythm EF around 55 to 60%  TELEMETRY: Normal sinus rhythm first-degree AV block rate of 86:  ASSESSMENT AND PLAN:  Principal  Problem:   Severe sepsis with acute organ dysfunction (HCC) Active Problems:   Type 2 diabetes mellitus with hyperglycemia, with long-term current use of insulin  (HCC)   HTN (hypertension)   Dyslipidemia   Chronic pain syndrome   GERD (gastroesophageal reflux disease)   Tobacco use disorder   Diabetic gastroparesis (HCC)   Hypothyroidism   Anasarca associated with disorder  of kidney   Obesity, Class III, BMI 40-49.9 (morbid obesity) (HCC)   Elevated troponin    Plan Severe sepsis endorgan dysfunction continue current therapy including antibiotics supportive care Continue BiPAP for respiratory support as needed Agree with antibiotic therapy for pneumonia in the lower lobes Status post thoracentesis 250 cc removed continue current management Elevated troponin consistent with demand ischemia from sepsis hypoxemia respiratory failure continue conservative therapy Hypertension reasonably controlled on Cardizem  and Coreg  Diabetes type 2 continue current management insulin  goal is to have A1c less than 7 End-stage renal disease on dialysis continue current management History of diastolic congestive heart failure continue Lasix  as necessary patient has renal failure so SGLT 2 contraindicated Obesity recommend modest weight loss exercise portion control   Cara JONETTA Lovelace, MD 06/28/2024 3:07 PM

## 2024-06-29 DIAGNOSIS — R6520 Severe sepsis without septic shock: Secondary | ICD-10-CM | POA: Diagnosis not present

## 2024-06-29 DIAGNOSIS — J90 Pleural effusion, not elsewhere classified: Secondary | ICD-10-CM

## 2024-06-29 DIAGNOSIS — A419 Sepsis, unspecified organism: Secondary | ICD-10-CM | POA: Diagnosis not present

## 2024-06-29 DIAGNOSIS — F17200 Nicotine dependence, unspecified, uncomplicated: Secondary | ICD-10-CM

## 2024-06-29 DIAGNOSIS — J42 Unspecified chronic bronchitis: Secondary | ICD-10-CM

## 2024-06-29 LAB — CBC
HCT: 30.3 % — ABNORMAL LOW (ref 39.0–52.0)
Hemoglobin: 9.5 g/dL — ABNORMAL LOW (ref 13.0–17.0)
MCH: 29.6 pg (ref 26.0–34.0)
MCHC: 31.4 g/dL (ref 30.0–36.0)
MCV: 94.4 fL (ref 80.0–100.0)
Platelets: 173 K/uL (ref 150–400)
RBC: 3.21 MIL/uL — ABNORMAL LOW (ref 4.22–5.81)
RDW: 14.6 % (ref 11.5–15.5)
WBC: 7.4 K/uL (ref 4.0–10.5)
nRBC: 0 % (ref 0.0–0.2)

## 2024-06-29 LAB — GLUCOSE, CAPILLARY
Glucose-Capillary: 255 mg/dL — ABNORMAL HIGH (ref 70–99)
Glucose-Capillary: 257 mg/dL — ABNORMAL HIGH (ref 70–99)
Glucose-Capillary: 211 mg/dL — ABNORMAL HIGH (ref 70–99)

## 2024-06-29 LAB — RENAL FUNCTION PANEL
Albumin: 2 g/dL — ABNORMAL LOW (ref 3.5–5.0)
Anion gap: 12 (ref 5–15)
BUN: 36 mg/dL — ABNORMAL HIGH (ref 8–23)
CO2: 23 mmol/L (ref 22–32)
Calcium: 7.8 mg/dL — ABNORMAL LOW (ref 8.9–10.3)
Chloride: 103 mmol/L (ref 98–111)
Creatinine, Ser: 3.48 mg/dL — ABNORMAL HIGH (ref 0.61–1.24)
GFR, Estimated: 19 mL/min — ABNORMAL LOW (ref >60)
Glucose, Bld: 215 mg/dL — ABNORMAL HIGH (ref 70–99)
Phosphorus: 3.7 mg/dL (ref 2.5–4.6)
Potassium: 4.2 mmol/L (ref 3.5–5.1)
Sodium: 138 mmol/L (ref 135–145)

## 2024-06-29 MED ORDER — HEPARIN SODIUM (PORCINE) 1000 UNIT/ML IJ SOLN
INTRAMUSCULAR | Status: AC
  Filled 2024-06-29: qty 6

## 2024-06-29 MED ORDER — FLUTICASONE FUROATE-VILANTEROL 100-25 MCG/ACT IN AEPB
1.0000 | INHALATION_SPRAY | Freq: Every day | RESPIRATORY_TRACT | Status: DC
  Filled 2024-06-29: qty 28

## 2024-06-29 MED ORDER — IPRATROPIUM-ALBUTEROL 0.5-2.5 (3) MG/3ML IN SOLN: 3.0000 mL | Freq: Four times a day (QID) | RESPIRATORY_TRACT | Status: DC | PRN

## 2024-06-29 MED ORDER — OXYCODONE HCL 5 MG PO TABS
ORAL_TABLET | ORAL | Status: AC
  Filled 2024-06-29: qty 2

## 2024-06-29 MED ORDER — EPOETIN ALFA-EPBX 4000 UNIT/ML IJ SOLN
INTRAMUSCULAR | Status: AC
  Filled 2024-06-29: qty 1

## 2024-06-29 MED ORDER — INSULIN ASPART 100 UNIT/ML IJ SOLN
8.0000 [IU] | Freq: Three times a day (TID) | INTRAMUSCULAR | Status: DC
  Administered 2024-06-29 – 2024-06-30 (×3): 8 [IU] via SUBCUTANEOUS
  Filled 2024-06-29 (×3): qty 1

## 2024-06-29 MED ORDER — INSULIN GLARGINE 100 UNIT/ML ~~LOC~~ SOLN
16.0000 [IU] | Freq: Two times a day (BID) | SUBCUTANEOUS | Status: DC
  Administered 2024-06-29 – 2024-06-30 (×2): 16 [IU] via SUBCUTANEOUS
  Filled 2024-06-29 (×4): qty 0.16

## 2024-06-29 NOTE — Progress Notes (Signed)
 [facility]  ROUNDING NOTE   Subjective:   Eugene [name] is a 63 y.o. male with past medical conditions including diabetes, bilateral BKA, suprapubic catheter, hepatitis C, and end-stage renal disease on hemodialysis.  Patient presented to the emergency department from nursing facility with altered mental status and respiratory distress.  Patient currently admitted to ICU for Anasarca [R60.1] Severe sepsis with acute organ dysfunction (HCC) [A41.9, R65.20] Acute on chronic respiratory failure with hypoxia (HCC) [J96.21] Altered mental status, unspecified altered mental status type [R41.82] Pneumonia due to infectious organism, unspecified laterality, unspecified part of lung [J18.9]  Patient is known to our practice and receives outpatient dialysis treatments at [facility] on a MWF schedule, supervised by Dr. Dennise.   Update:  Patient seen and evaluated during dialysis   HEMODIALYSIS FLOWSHEET:  Blood Flow Rate (mL/min): 399 mL/min Arterial Pressure (mmHg): -198.98 mmHg Venous Pressure (mmHg): 205.45 mmHg TMP (mmHg): -22.22 mmHg Ultrafiltration Rate (mL/min): 1235 mL/min Dialysate Flow Rate (mL/min): 50 ml/min Dialysis Fluid Bolus: Normal Saline  Tolerating treatment well   Objective:  Vital signs in last 24 hours:  Temp:  [98.1 F (36.7 C)-99.1 F (37.3 C)] 98.8 F (37.1 C) (10/06 1226) Pulse Rate:  [62-85] 71 (10/06 1231) Resp:  [13-19] 13 (10/06 1231) BP: (127-181)/(71-91) 134/81 (10/06 1231) SpO2:  [94 %-98 %] 97 % (10/06 1231) Weight:  [122.8 kg] 122.8 kg (10/06 0825)  Weight change:  Filed Weights   06/27/24 1255 06/27/24 1542 06/29/24 0825  Weight: 127.8 kg 124.8 kg 122.8 kg    Intake/Output: I/O last 3 completed shifts: In: 320 [P.O.:320] Out: 2800 [Urine:2800]   Intake/Output this shift:  Total I/O In: -  Out: 3000 [Other:3000]  Physical Exam: General: NAD  Head: Normocephalic, atraumatic. Moist oral mucosal membranes  Eyes:  Anicteric  Lungs:  Diminished  Heart: Regular rate and rhythm  Abdomen:  Soft, nontender, obese  Extremities: 1+ peripheral edema.  Bilateral AKA  Neurologic: Awake, alert, conversant  Skin: Warm,dry, no rash  Access: Right IJ PermCath    Basic Metabolic Panel: Recent Labs  Lab 06/22/24 1553 06/23/24 0347 06/24/24 0505 06/25/24 0500 06/26/24 0420 06/28/24 0422 06/29/24 0910  NA 133*   < > 134* 132* 134* 136 138  K 5.4*   < > 5.1 3.7 4.5 4.5 4.2  CL 99   < > 99 99 100 103 103  CO2 20*   < > 25 25 25 26 23   GLUCOSE 335*   < > 366* 283* 231* 209* 215*  BUN 63*   < > 65* 41* 42* 31* 36*  CREATININE 6.07*   < > 5.12* 3.51* 3.52* 3.01* 3.48*  CALCIUM  7.7*   < > 7.4* 7.3* 7.4* 7.6* 7.8*  MG 2.5*  --   --   --   --   --   --   PHOS 7.1*  --   --   --   --   --  3.7   < > = values in this interval not displayed.    Liver Function Tests: Recent Labs  Lab 06/22/24 1553 06/29/24 0910  AST 114*  --   ALT 102*  --   ALKPHOS 70  --   BILITOT 0.6  --   PROT 7.0  --   ALBUMIN  2.4* 2.0*   No results for input(s): LIPASE, AMYLASE in the last 168 hours. No results for input(s): AMMONIA in the last 168 hours.  CBC: Recent Labs  Lab 06/22/24 1553 06/23/24 0347 06/24/24  0505 06/24/24 1010 06/25/24 0500 06/26/24 0845 06/29/24 0910  WBC 15.1*   < > 9.6 12.8* 8.9 5.6 7.4  NEUTROABS 11.4*  --   --   --   --   --   --   HGB 9.7*   < > 9.0* 8.7* 9.1* 9.3* 9.5*  HCT 31.7*   < > 29.7* 27.7* 29.3* 29.7* 30.3*  MCV 96.4   < > 96.1 96.2 94.8 95.8 94.4  PLT 214   < > 201 205 186 179 173   < > = values in this interval not displayed.    Cardiac Enzymes: No results for input(s): CKTOTAL, CKMB, CKMBINDEX, TROPONINI in the last 168 hours.  BNP: Invalid input(s): POCBNP  CBG: Recent Labs  Lab 06/27/24 2030 06/28/24 0830 06/28/24 1213 06/28/24 1605 06/28/24 2039  GLUCAP 256* 245* 206* 280* 193*    Microbiology: Results for orders placed or performed during  the hospital encounter of 06/22/24  Culture, blood (routine x 2)     Status: None   Collection Time: 06/22/24  3:53 PM   Specimen: BLOOD  Result Value Ref Range Status   Specimen Description BLOOD BLOOD LEFT ARM  Final   Special Requests   Final    BOTTLES DRAWN AEROBIC AND ANAEROBIC Blood Culture adequate volume   Culture   Final    NO GROWTH 5 DAYS Performed at [facility], [address] [place], [place] [address]    Report Status 06/27/2024 FINAL  Final  Resp panel by RT-PCR (RSV, Flu A&B, Covid) Anterior Nasal Swab     Status: None   Collection Time: 06/22/24  3:53 PM   Specimen: Anterior Nasal Swab  Result Value Ref Range Status   SARS Coronavirus 2 by RT PCR NEGATIVE NEGATIVE Final    Comment: (NOTE) SARS-CoV-2 target nucleic acids are NOT DETECTED.  The SARS-CoV-2 RNA is generally detectable in upper respiratory specimens during the acute phase of infection. The lowest concentration of SARS-CoV-2 viral copies this assay can detect is 138 copies/mL. A negative result does not preclude SARS-Cov-2 infection and should not be used as the sole basis for treatment or other patient management decisions. A negative result may occur with  improper specimen collection/handling, submission of specimen other than nasopharyngeal swab, presence of viral mutation(s) within the areas targeted by this assay, and inadequate number of viral copies(<138 copies/mL). A negative result must be combined with clinical observations, patient history, and epidemiological information. The expected result is Negative.  Fact Sheet for Patients:  BloggerCourse.com  Fact Sheet for Healthcare Providers:  SeriousBroker.it  This test is no t yet approved or cleared by the [place]  FDA and  has been authorized for detection and/or diagnosis of SARS-CoV-2 by FDA under an Emergency Use Authorization (EUA). This EUA will remain  in effect  (meaning this test can be used) for the duration of the COVID-19 declaration under Section 564(b)(1) of the Act, 21 U.S.C.section 360bbb-3(b)(1), unless the authorization is terminated  or revoked sooner.       Influenza A by PCR NEGATIVE NEGATIVE Final   Influenza B by PCR NEGATIVE NEGATIVE Final    Comment: (NOTE) The Xpert Xpress SARS-CoV-2/FLU/RSV plus assay is intended as an aid in the diagnosis of influenza from Nasopharyngeal swab specimens and should not be used as a sole basis for treatment. Nasal washings and aspirates are unacceptable for Xpert Xpress SARS-CoV-2/FLU/RSV testing.  Fact Sheet for Patients: BloggerCourse.com  Fact Sheet for Healthcare Providers: SeriousBroker.it  This test  is not yet approved or cleared by the [place]  FDA and has been authorized for detection and/or diagnosis of SARS-CoV-2 by FDA under an Emergency Use Authorization (EUA). This EUA will remain in effect (meaning this test can be used) for the duration of the COVID-19 declaration under Section 564(b)(1) of the Act, 21 U.S.C. section 360bbb-3(b)(1), unless the authorization is terminated or revoked.     Resp Syncytial Virus by PCR NEGATIVE NEGATIVE Final    Comment: (NOTE) Fact Sheet for Patients: BloggerCourse.com  Fact Sheet for Healthcare Providers: SeriousBroker.it  This test is not yet approved or cleared by the [place]  FDA and has been authorized for detection and/or diagnosis of SARS-CoV-2 by FDA under an Emergency Use Authorization (EUA). This EUA will remain in effect (meaning this test can be used) for the duration of the COVID-19 declaration under Section 564(b)(1) of the Act, 21 U.S.C. section 360bbb-3(b)(1), unless the authorization is terminated or revoked.  Performed at [facility], [address], [place], [place] [address]   Urine Culture      Status: Abnormal   Collection Time: 06/22/24  4:00 PM   Specimen: Urine, Suprapubic  Result Value Ref Range Status   Specimen Description   Final    URINE, SUPRAPUBIC Performed at [facility], [address], [place], [place] [address]    Special Requests   Final    NONE Performed at [facility], [address] [place], [place] [address]    Culture (A)  Final    >=100,000 COLONIES/mL ESCHERICHIA COLI >=100,000 COLONIES/mL PROVIDENCIA STUARTII Confirmed Extended Spectrum Beta-Lactamase Producer (ESBL).  In bloodstream infections from ESBL organisms, carbapenems are preferred over piperacillin /tazobactam. They are shown to have a lower risk of mortality.    Report Status 06/25/2024 FINAL  Final   Organism ID, Bacteria PROVIDENCIA STUARTII (A)  Final   Organism ID, Bacteria ESCHERICHIA COLI (A)  Final      Susceptibility   Escherichia coli - MIC*    AMPICILLIN >=32 RESISTANT Resistant     CEFAZOLIN  (URINE) Value in next row Resistant      >=32 RESISTANTThis is a modified FDA-approved test that has been validated and its performance characteristics determined by the reporting laboratory.  This laboratory is certified under the Clinical Laboratory Improvement Amendments CLIA as qualified to perform high complexity clinical laboratory testing.    CEFEPIME  Value in next row Resistant      >=32 RESISTANTThis is a modified FDA-approved test that has been validated and its performance characteristics determined by the reporting laboratory.  This laboratory is certified under the Clinical Laboratory Improvement Amendments CLIA as qualified to perform high complexity clinical laboratory testing.    ERTAPENEM Value in next row Sensitive      >=32 RESISTANTThis is a modified FDA-approved test that has been validated and its performance characteristics determined by the reporting laboratory.  This laboratory is certified under the Clinical Laboratory Improvement Amendments CLIA as  qualified to perform high complexity clinical laboratory testing.    CEFTRIAXONE  Value in next row Resistant      >=32 RESISTANTThis is a modified FDA-approved test that has been validated and its performance characteristics determined by the reporting laboratory.  This laboratory is certified under the Clinical Laboratory Improvement Amendments CLIA as qualified to perform high complexity clinical laboratory testing.    CIPROFLOXACIN  Value in next row Resistant      >=32 RESISTANTThis is a modified FDA-approved test that has been validated and its performance characteristics determined  by the reporting laboratory.  This laboratory is certified under the Clinical Laboratory Improvement Amendments CLIA as qualified to perform high complexity clinical laboratory testing.    GENTAMICIN Value in next row Sensitive      >=32 RESISTANTThis is a modified FDA-approved test that has been validated and its performance characteristics determined by the reporting laboratory.  This laboratory is certified under the Clinical Laboratory Improvement Amendments CLIA as qualified to perform high complexity clinical laboratory testing.    NITROFURANTOIN Value in next row Sensitive      >=32 RESISTANTThis is a modified FDA-approved test that has been validated and its performance characteristics determined by the reporting laboratory.  This laboratory is certified under the Clinical Laboratory Improvement Amendments CLIA as qualified to perform high complexity clinical laboratory testing.    TRIMETH /SULFA  Value in next row Sensitive      >=32 RESISTANTThis is a modified FDA-approved test that has been validated and its performance characteristics determined by the reporting laboratory.  This laboratory is certified under the Clinical Laboratory Improvement Amendments CLIA as qualified to perform high complexity clinical laboratory testing.    AMPICILLIN/SULBACTAM Value in next row Resistant      >=32 RESISTANTThis is a  modified FDA-approved test that has been validated and its performance characteristics determined by the reporting laboratory.  This laboratory is certified under the Clinical Laboratory Improvement Amendments CLIA as qualified to perform high complexity clinical laboratory testing.    PIP/TAZO Value in next row Sensitive      16 SENSITIVEThis is a modified FDA-approved test that has been validated and its performance characteristics determined by the reporting laboratory.  This laboratory is certified under the Clinical Laboratory Improvement Amendments CLIA as qualified to perform high complexity clinical laboratory testing.    MEROPENEM Value in next row Sensitive      16 SENSITIVEThis is a modified FDA-approved test that has been validated and its performance characteristics determined by the reporting laboratory.  This laboratory is certified under the Clinical Laboratory Improvement Amendments CLIA as qualified to perform high complexity clinical laboratory testing.    * >=100,000 COLONIES/mL ESCHERICHIA COLI   Providencia stuartii - MIC*    AMPICILLIN Value in next row Resistant      16 SENSITIVEThis is a modified FDA-approved test that has been validated and its performance characteristics determined by the reporting laboratory.  This laboratory is certified under the Clinical Laboratory Improvement Amendments CLIA as qualified to perform high complexity clinical laboratory testing.    CEFEPIME  Value in next row Sensitive      16 SENSITIVEThis is a modified FDA-approved test that has been validated and its performance characteristics determined by the reporting laboratory.  This laboratory is certified under the Clinical Laboratory Improvement Amendments CLIA as qualified to perform high complexity clinical laboratory testing.    ERTAPENEM Value in next row Sensitive      16 SENSITIVEThis is a modified FDA-approved test that has been validated and its performance characteristics determined by the  reporting laboratory.  This laboratory is certified under the Clinical Laboratory Improvement Amendments CLIA as qualified to perform high complexity clinical laboratory testing.    CEFTRIAXONE  Value in next row Sensitive      16 SENSITIVEThis is a modified FDA-approved test that has been validated and its performance characteristics determined by the reporting laboratory.  This laboratory is certified under the Clinical Laboratory Improvement Amendments CLIA as qualified to perform high complexity clinical laboratory testing.    CIPROFLOXACIN  Value in next row Resistant  16 SENSITIVEThis is a modified FDA-approved test that has been validated and its performance characteristics determined by the reporting laboratory.  This laboratory is certified under the Clinical Laboratory Improvement Amendments CLIA as qualified to perform high complexity clinical laboratory testing.    GENTAMICIN Value in next row Resistant      16 SENSITIVEThis is a modified FDA-approved test that has been validated and its performance characteristics determined by the reporting laboratory.  This laboratory is certified under the Clinical Laboratory Improvement Amendments CLIA as qualified to perform high complexity clinical laboratory testing.    NITROFURANTOIN Value in next row Resistant      16 SENSITIVEThis is a modified FDA-approved test that has been validated and its performance characteristics determined by the reporting laboratory.  This laboratory is certified under the Clinical Laboratory Improvement Amendments CLIA as qualified to perform high complexity clinical laboratory testing.    TRIMETH /SULFA  Value in next row Sensitive      16 SENSITIVEThis is a modified FDA-approved test that has been validated and its performance characteristics determined by the reporting laboratory.  This laboratory is certified under the Clinical Laboratory Improvement Amendments CLIA as qualified to perform high complexity clinical  laboratory testing.    AMPICILLIN/SULBACTAM Value in next row Intermediate      16 SENSITIVEThis is a modified FDA-approved test that has been validated and its performance characteristics determined by the reporting laboratory.  This laboratory is certified under the Clinical Laboratory Improvement Amendments CLIA as qualified to perform high complexity clinical laboratory testing.    PIP/TAZO Value in next row Sensitive      <=4 SENSITIVEThis is a modified FDA-approved test that has been validated and its performance characteristics determined by the reporting laboratory.  This laboratory is certified under the Clinical Laboratory Improvement Amendments CLIA as qualified to perform high complexity clinical laboratory testing.    MEROPENEM Value in next row Sensitive      <=4 SENSITIVEThis is a modified FDA-approved test that has been validated and its performance characteristics determined by the reporting laboratory.  This laboratory is certified under the Clinical Laboratory Improvement Amendments CLIA as qualified to perform high complexity clinical laboratory testing.    * >=100,000 COLONIES/mL PROVIDENCIA STUARTII  Culture, blood (routine x 2)     Status: None   Collection Time: 06/23/24 11:23 AM   Specimen: BLOOD  Result Value Ref Range Status   Specimen Description BLOOD BLOOD RIGHT HAND  Final   Special Requests   Final    BOTTLES DRAWN AEROBIC AND ANAEROBIC Blood Culture adequate volume   Culture   Final    NO GROWTH 5 DAYS Performed at [facility], [address], [place], [place] [address]    Report Status 06/28/2024 FINAL  Final  Body fluid culture w Gram Stain     Status: None   Collection Time: 06/24/24  3:55 PM   Specimen: PATH Cytology Pleural fluid  Result Value Ref Range Status   Specimen Description   Final    PLEURAL Performed at [facility], [address], [place], [place] [address]    Special Requests   Final    NONE Performed at [facility], [address] [place], [place] [address]    Gram Stain   Final    WBC PRESENT,BOTH PMN AND MONONUCLEAR NO ORGANISMS SEEN CYTOSPIN SMEAR    Culture   Final    NO GROWTH 3 DAYS Performed at [facility], [address] [place],  [place] [address]    Report Status 06/27/2024 FINAL  Final    Coagulation Studies: No results for input(s): LABPROT, INR in the last 72 hours.   Urinalysis: No results for input(s): COLORURINE, LABSPEC, PHURINE, GLUCOSEU, HGBUR, BILIRUBINUR, KETONESUR, PROTEINUR, UROBILINOGEN, NITRITE, LEUKOCYTESUR in the last 72 hours.  Invalid input(s): APPERANCEUR     Imaging: DG Chest 1 View Result Date: 06/28/2024 CLINICAL DATA:  Shortness of breath. EXAM: CHEST  1 VIEW COMPARISON:  06/26/2024 FINDINGS: Right-sided dialysis catheter unchanged. Partially loculated left pleural effusion, minimally increased. Fluid tracks into the fissure. Small right pleural effusion is unchanged. Stable bibasilar opacities. Stable heart size and mediastinal contours. Postsurgical change in the thoracic spine. IMPRESSION: 1. Partially loculated left pleural effusion, minimally increased. 2. Small right pleural effusion is unchanged. 3. Stable bibasilar opacities. Electronically Signed   By: Andrea Gasman M.D.   On: 06/28/2024 19:26     Medications:      carvedilol   6.25 mg Oral BID WC   Chlorhexidine  Gluconate Cloth  6 each Topical Q0600   diltiazem   240 mg Oral QPM   epoetin  alfa-epbx (RETACRIT ) injection  4,000 Units Intravenous Q M,W,F-1800   famotidine   10 mg Oral Daily   furosemide   80 mg Oral Daily   Gerhardt's butt cream   Topical TID   heparin   5,000 Units Subcutaneous Q8H   insulin  aspart  0-15 Units Subcutaneous TID WC   insulin  aspart  0-5 Units Subcutaneous QHS   insulin  aspart  8 Units Subcutaneous TID WC   insulin  glargine  16 Units Subcutaneous BID   lactulose   20 g Oral BID   levothyroxine   175 mcg Oral  Q0600   linaclotide   145 mcg Oral QAC breakfast   melatonin  10 mg Oral QHS   bisacodyl , bisacodyl , oxyCODONE , oxyCODONE , polyethylene glycol  Assessment/ Plan:  Mr. Oral Remache is a 63 y.o.  male with past medical conditions including diabetes, bilateral BKA, suprapubic catheter, hepatitis C, and end-stage renal disease on hemodialysis.  Patient presented to the emergency department from nursing facility with altered mental status and respiratory distress.  Patient currently admitted to ICU for Anasarca [R60.1] Severe sepsis with acute organ dysfunction (HCC) [A41.9, R65.20] Acute on chronic respiratory failure with hypoxia (HCC) [J96.21] Altered mental status, unspecified altered mental status type [R41.82] Pneumonia due to infectious organism, unspecified laterality, unspecified part of lung [J18.9]  [facility]/MWF/right PermCath  End-stage renal disease with hyperkalemia on hemodialysis.  Received UF only treatment today, UF 3L achieved. Next treatment scheduled for Wednesday.   2.  Acute respiratory failure, patient placed on BiPAP on ED arrival.  Thoracentesis on 10/1 with 0.25L fluid removal.  Will continue to remove fluid with dialysis. Will continue to wean oxygen.   3. Anemia of chronic kidney disease Lab Results  Component Value Date   HGB 9.5 (L) 06/29/2024    Hemoglobin borderline, continue Retacrit  4000 unit with dialysis  4. Diabetes mellitus type II with chronic kidney disease/renal manifestations: insulin  dependent. Home regimen includes glargine and lispro. Most recent hemoglobin A1c is 6.0 on 04/14/2024.   Primary team to continue management.     LOS: 7 Takashi Korol 10/6/20252:08 PM

## 2024-06-29 NOTE — Inpatient Diabetes Management (Signed)
 Inpatient Diabetes Program Recommendations  AACE/ADA: New Consensus Statement on Inpatient Glycemic Control   Target Ranges:  Prepandial:   less than 140 mg/dL      Peak postprandial:   less than 180 mg/dL (1-2 hours)      Critically ill patients:  140 - 180 mg/dL    Latest Reference Range & Units 06/28/24 08:30 06/28/24 12:13 06/28/24 16:05 06/28/24 20:39  Glucose-Capillary 70 - 99 mg/dL 754 (H) 793 (H) 719 (H) 193 (H)   Review of Glycemic Control  Diabetes history: DM2 Outpatient Diabetes medications: Semglee  12-14 units BID, Humalog  0-15 units TID, Januvia 25 mg daily Current orders for Inpatient glycemic control: Lantus  14 units BID, Novolog  0-15 units TID with meals, Novolog  0-5 units at bedtime, Novolog  5 units TID with meals  Inpatient Diabetes Program Recommendations:    Insulin : Please consider increasing Lantus  to 17 units BID and meal coverage to Novolog  8 units TID.  Thanks, Earnie Gainer, RN, MSN, CDCES Diabetes Coordinator Inpatient Diabetes Program [phone] (Team Pager from 8am to 5pm)

## 2024-06-29 NOTE — Plan of Care (Signed)

## 2024-06-29 NOTE — Consult Note (Signed)
 Pulmonary Critical Care  Initial Consult Note  Eugene [name] FMW:[id number] DOB: [birth year] DOA: 06/22/2024  Referring physician: Dr Jerelene  Chief Complaint: Pl effusion  HPI: Eugene [name] is a 63 y.o. male with past medical history of multiple problems including morbid obesity end-stage renal failure on dialysis diabetes mellitus hypothyroidism hypertension came into the hospital because of shortness of breath and reported altered mental status.  He states to me that he is always short of breath.  He feels like he cannot get around but it should be noted that his both legs have been amputated.  The patient had a CT scan done and a chest x-ray done and this revealed that the patient had left-sided pleural effusion.  On further review of his x-rays it appears that the patient has had a pleural effusion going back to August 2025.  The last time that he had a normal chest x-ray was in 2022.  The patient on this admission underwent thoracentesis with removal of only 250 cc of fluid.  On a repeat chest x-ray the x-ray is read as a minimally increased partially loculated pleural effusion.  In this context it does not appear that this pleural fluid is really significant enough to be causing his shortness of breath.  Patient also had a cardiology consultation which was reviewed  Review of Systems:  Constitutional:  No weight loss, night sweats, Fevers, chills, fatigue.  HEENT:  No headaches, nasal congestion, post nasal drip,  Cardio-vascular:  No chest pain, Orthopnea, PND, swelling in lower extremities, anasarca, dizziness, palpitations  GI:  No heartburn, indigestion, abdominal pain, nausea, vomiting, diarrhea  Resp:  +shortness of breath with exertion or at rest. no productive cough, No coughing up of blood.No wheezing  Remainder ROS performed and is unremarkable other than noted in HPI  Past Medical History:  Diagnosis Date   Anxiety    Depression    Diabetes mellitus without complication  (HCC)    Hepatitis C    Hyperlipidemia    IBS (irritable bowel syndrome)    Osteomyelitis (HCC)    Spinal stenosis    Past Surgical History:  Procedure Laterality Date   BACK SURGERY     bilateral amputation Bilateral    CENTRAL LINE INSERTION-TUNNELED N/A 02/02/2019   Procedure: CENTRAL LINE INSERTION-TUNNELED;  Surgeon: Marea Selinda RAMAN, MD;  Location: [facility];  Service: Cardiovascular;  Laterality: N/A;   COLONOSCOPY WITH PROPOFOL  N/A 01/12/2020   Procedure: COLONOSCOPY WITH PROPOFOL ;  Surgeon: Jinny Carmine, MD;  Location: [facility];  Service: Endoscopy;  Laterality: N/A;   DIALYSIS/PERMA CATHETER INSERTION N/A 05/22/2024   Procedure: DIALYSIS/PERMA CATHETER INSERTION;  Surgeon: Jama Cordella MATSU, MD;  Location: [facility];  Service: Cardiovascular;  Laterality: N/A;   ESOPHAGOGASTRODUODENOSCOPY (EGD) WITH PROPOFOL  N/A 12/07/2019   Procedure: ESOPHAGOGASTRODUODENOSCOPY (EGD) WITH PROPOFOL ;  Surgeon: Unk Corinn Skiff, MD;  Location: [facility];  Service: Gastroenterology;  Laterality: N/A;   IR CATHETER TUBE CHANGE  12/04/2019   SPINAL FUSION     THORACIC SPINE SURGERY  01/2019   extensive washout   Social History:  reports that he has been smoking cigarettes. He has never used smokeless tobacco. He reports that he does not currently use alcohol. He reports that he does not currently use drugs.  No Known Allergies  Family History  Problem Relation Age of Onset   Kidney failure Father    Kidney failure Brother     Prior to Admission medications   Medication Sig Start Date End  Date Taking? Authorizing Provider  acetaminophen  (TYLENOL ) 325 MG tablet Take 650 mg by mouth in the morning and at bedtime.   Yes [provider]  bisacodyl  (DULCOLAX) 10 MG suppository Place 10 mg rectally daily as needed for moderate constipation.   Yes [provider]  bisacodyl  (DULCOLAX) 5 MG EC tablet Take 2 tablets (10 mg total) by mouth at bedtime.  05/16/24  Yes Fausto Burnard LABOR, DO  carvedilol  (COREG ) 6.25 MG tablet Take 1 tablet (6.25 mg total) by mouth 2 (two) times daily with a meal. Patient taking differently: Take 6.25 mg by mouth 2 (two) times daily with a meal. Every Tuesday, Thursday, Saturday and sunday 05/28/24  Yes Caleen Qualia, MD  cyanocobalamin  1000 MCG tablet Take 1 tablet (1,000 mcg total) by mouth daily. 05/17/24  Yes Fausto Burnard LABOR, DO  diltiazem  (CARDIZEM  CD) 240 MG 24 hr capsule Take 1 capsule (240 mg total) by mouth every evening. 05/28/24  Yes Caleen Qualia, MD  Doxepin  HCl 6 MG TABS Take 6 mg by mouth at bedtime.   Yes [provider]  famotidine  (PEPCID ) 10 MG tablet Take 1 tablet (10 mg total) by mouth daily. 05/17/24  Yes Fausto Burnard A, DO  folic acid  (FOLVITE ) 1 MG tablet Take 1 tablet (1 mg total) by mouth daily. 05/17/24  Yes Fausto Burnard A, DO  furosemide  (LASIX ) 80 MG tablet Take 1 tablet (80 mg total) by mouth daily. Patient taking differently: Take 80 mg by mouth at bedtime. 05/28/24  Yes Caleen Qualia, MD  gabapentin  (NEURONTIN ) 300 MG capsule Take 300 mg by mouth at bedtime.   Yes [provider]  guaifenesin (ROBITUSSIN) 100 MG/5ML syrup Take 200 mg by mouth 3 (three) times daily as needed for cough.   Yes [provider]  insulin  glargine-yfgn (SEMGLEE ) 100 UNIT/ML injection Inject 0.1 mLs (10 Units total) into the skin 2 (two) times daily. Patient taking differently: Inject 12-14 Units into the skin 2 (two) times daily. 05/01/24  Yes Sreenath, Sudheer B, MD  insulin  lispro (HUMALOG ) 100 UNIT/ML injection Inject 0-15 Units into the skin 4 (four) times daily -  before meals and at bedtime. <150= 0 units 151-200= 3 units 201-250= 6 units 251-300= 9 units 301-350= 12 units 351-400= 15 units >400 call MD Patient taking differently: Inject 0-15 Units into the skin 4 (four) times daily -  before meals and at bedtime. <150= 0 units 151-200= 3 units 201-250= 6 units 251-300= 9  units 301-350= 12 units 351-400= 15 units >400 call MD   Yes [provider]  ipratropium-albuterol  (DUONEB) 0.5-2.5 (3) MG/3ML SOLN Take 3 mLs by nebulization every 6 (six) hours as needed.   Yes [provider]  Lactulose  20 GM/30ML SOLN Take 30 mLs by mouth 2 (two) times daily.   Yes [provider]  levothyroxine  (SYNTHROID ) 175 MCG tablet Take 175 mcg by mouth at bedtime.   Yes [provider]  linaclotide  (LINZESS ) 145 MCG CAPS capsule Take 1 capsule (145 mcg total) by mouth daily before breakfast. 10/25/19  Yes Danford, Lonni SQUIBB, MD  Melatonin 5 MG TABS Take 10 mg by mouth at bedtime.   Yes [provider]  methylPREDNISolone  sodium succinate (SOLU-MEDROL ) 125 mg/2 mL injection Inject 125 mg into the muscle once.   Yes [provider]  metoCLOPramide  (REGLAN ) 10 MG tablet Take 0.5 tablets (5 mg total) by mouth every 8 (eight) hours as needed for nausea or vomiting. 05/28/24  Yes Caleen Qualia, MD  midodrine  (  PROAMATINE ) 5 MG tablet Take 5 mg by mouth 3 (three) times daily with meals as needed.   Yes [provider]  ondansetron  (ZOFRAN ) 4 MG tablet Take 4 mg by mouth every 8 (eight) hours as needed for nausea or vomiting.   Yes [provider]  Oxycodone  HCl 10 MG TABS Take 0.5-1 tablets (5-10 mg total) by mouth every 6 (six) hours as needed. 05/28/24  Yes Amin, Sumayya, MD  polyethylene glycol (MIRALAX  / GLYCOLAX ) 17 g packet Take 17 g by mouth 2 (two) times daily. 10/24/19  Yes Danford, Lonni SQUIBB, MD  senna-docusate (SENOKOT-S) 8.6-50 MG tablet Take 1 tablet by mouth 2 (two) times daily. 10/24/19  Yes Danford, Lonni SQUIBB, MD  simethicone  (MYLICON) 80 MG chewable tablet Chew 1 tablet (80 mg total) by mouth 4 (four) times daily. 05/01/24  Yes Sreenath, Sudheer B, MD  sitaGLIPtin (JANUVIA) 25 MG tablet Take 25 mg by mouth daily.   Yes [provider]  Vitamin D , Ergocalciferol , (DRISDOL ) 1.25 MG (50000 UNIT)  CAPS capsule Take 1 capsule (50,000 Units total) by mouth every 7 (seven) days. 05/18/24  Yes Fausto Sor A, DO  pregabalin  (LYRICA ) 75 MG capsule Take 75 mg by mouth 2 (two) times daily. Patient not taking: Reported on 04/17/2021  04/20/21  [provider]   Physical Exam: Vitals:   06/29/24 1200 06/29/24 1202 06/29/24 1226 06/29/24 1231  BP:  133/77 134/81 134/81  Pulse: 68 71 78 71  Resp: 13 14 16 13   Temp:   98.8 F (37.1 C)   TempSrc:   Oral   SpO2: 96% 96% 97% 97%  Weight:      Height:       Body mass index is 38.84 kg/m.   Wt Readings from Last 3 Encounters:  06/29/24 122.8 kg  05/28/24 127.4 kg  05/18/24 130.8 kg    General:  Appears calm and comfortable Eyes: PERRL, normal lids, irises & conjunctiva ENT: grossly normal hearing, lips & tongue Neck: no LAD, masses or thyromegaly Cardiovascular: RRR, no m/r/g. No LE edema. Respiratory: CTA bilaterally, no w/r/r.       Normal respiratory effort. Abdomen: soft, nontender Skin: no rash or induration seen on limited exam Musculoskeletal: grossly normal tone BUE/BLE Psychiatric: grossly normal mood and affect Neurologic: grossly non-focal.          Labs on Admission:  Basic Metabolic Panel: Recent Labs  Lab 06/22/24 1553 06/23/24 0347 06/24/24 0505 06/25/24 0500 06/26/24 0420 06/28/24 0422 06/29/24 0910  NA 133*   < > 134* 132* 134* 136 138  K 5.4*   < > 5.1 3.7 4.5 4.5 4.2  CL 99   < > 99 99 100 103 103  CO2 20*   < > 25 25 25 26 23   GLUCOSE 335*   < > 366* 283* 231* 209* 215*  BUN 63*   < > 65* 41* 42* 31* 36*  CREATININE 6.07*   < > 5.12* 3.51* 3.52* 3.01* 3.48*  CALCIUM  7.7*   < > 7.4* 7.3* 7.4* 7.6* 7.8*  MG 2.5*  --   --   --   --   --   --   PHOS 7.1*  --   --   --   --   --  3.7   < > = values in this interval not displayed.   Liver Function Tests: Recent Labs  Lab 06/22/24 1553 06/29/24 0910  AST 114*  --   ALT 102*  --  ALKPHOS 70  --   BILITOT 0.6  --   PROT 7.0  --    ALBUMIN  2.4* 2.0*   No results for input(s): LIPASE, AMYLASE in the last 168 hours. No results for input(s): AMMONIA in the last 168 hours. CBC: Recent Labs  Lab 06/22/24 1553 06/23/24 0347 06/24/24 0505 06/24/24 1010 06/25/24 0500 06/26/24 0845 06/29/24 0910  WBC 15.1*   < > 9.6 12.8* 8.9 5.6 7.4  NEUTROABS 11.4*  --   --   --   --   --   --   HGB 9.7*   < > 9.0* 8.7* 9.1* 9.3* 9.5*  HCT 31.7*   < > 29.7* 27.7* 29.3* 29.7* 30.3*  MCV 96.4   < > 96.1 96.2 94.8 95.8 94.4  PLT 214   < > 201 205 186 179 173   < > = values in this interval not displayed.   Cardiac Enzymes: No results for input(s): CKTOTAL, CKMB, CKMBINDEX, TROPONINI in the last 168 hours.  BNP (last 3 results) Recent Labs    05/11/24 2240 05/21/24 2323 06/22/24 1553  BNP 188.4* 456.6* 669.6*    ProBNP (last 3 results) No results for input(s): PROBNP in the last 8760 hours.  CBG: Recent Labs  Lab 06/28/24 0830 06/28/24 1213 06/28/24 1605 06/28/24 2039 06/29/24 1407  GLUCAP 245* 206* 280* 193* 255*    Radiological Exams on Admission: DG Chest 1 View Result Date: 06/28/2024 CLINICAL DATA:  Shortness of breath. EXAM: CHEST  1 VIEW COMPARISON:  06/26/2024 FINDINGS: Right-sided dialysis catheter unchanged. Partially loculated left pleural effusion, minimally increased. Fluid tracks into the fissure. Small right pleural effusion is unchanged. Stable bibasilar opacities. Stable heart size and mediastinal contours. Postsurgical change in the thoracic spine. IMPRESSION: 1. Partially loculated left pleural effusion, minimally increased. 2. Small right pleural effusion is unchanged. 3. Stable bibasilar opacities. Electronically Signed   By: Andrea Gasman M.D.   On: 06/28/2024 19:26    EKG: Independently reviewed.  Assessment/Plan Principal Problem:   Severe sepsis with acute organ dysfunction (HCC) Active Problems:   Elevated troponin   Type 2 diabetes mellitus with hyperglycemia, with  long-term current use of insulin  (HCC)   HTN (hypertension)   Dyslipidemia   Chronic pain syndrome   GERD (gastroesophageal reflux disease)   Tobacco use disorder   Diabetic gastroparesis (HCC)   Hypothyroidism   Anasarca associated with disorder of kidney   Obesity, Class III, BMI 40-49.9 (morbid obesity) (HCC)   Pleural effusion I do not think that there has been significant reaccumulation of fluid based on current radiological studies specifically from October 1 to the most recent chest x-ray of October 5.  I personally reviewed the films.  I think he does have probably some loculations so therefore the finding of the recurrence of pleural fluid is not surprising due to the fact that his lung probably did not fully reexpand after the thoracentesis was done.  He may indeed have an underlying trapped lung.  Due to his underlying comorbid conditions being a vasculopath and obesity I do not think he is at candidate for any major surgical intervention.  One may consider placing a pigtail catheter to drain the fluid and see if the lung will reexpand however I doubt that that will be successful.  On review of his x-rays as already noted above going back to 2022 was the last time that he had a normal-appearing chest x-ray. Probable COPD patient has a history of cigarette smoking about a  pack a day for 25 years this may be more likely to be causing his issues with the breathing.  I would recommend placing him on inhalers which I have taken the liberty of going ahead and doing.  In addition he may benefit from nebulizers acutely. Oxygen patient is going to need oxygen probably at discharge.  Code Status: DNR limited  Family Communication: No family present in the room Disposition Plan: Skilled nursing facility  Time spent: 82  I have personally obtained a history, examined the patient, evaluated laboratory and imaging results, formulated the assessment and plan and placed orders.  The Patient  requires high complexity decision making for assessment and support. Total Time Spent   Elfreda DELENA Bathe, MD [facility] Pulmonary Critical Care Medicine Sleep Medicine

## 2024-06-29 NOTE — Progress Notes (Signed)
 Progress Note   Patient: Eugene [name] FMW:[id number] DOB: Jan 07, 1961 DOA: 06/22/2024     7 DOS: the patient was seen and examined on 06/29/2024   Brief hospital course: [name] is a 63 year old male with history of morbid obesity, end-stage renal disease on hemodialysis, insulin -dependent diabetes mellitus 2, hypothyroid, hypertension admitted for sepsis due to pneumonia. Hospital course as below  Assessment and Plan: Severe sepsis with acute organ dysfunction (HCC) Secondary to pneumonia Acute on chronic hypoxic respiratory failure - Presented with WBC 19.2, tachycardia - resolved - Patient required BiPAP on arrival but have been weaned off to high flow - CT scan of the chest showing findings consolidation in bilateral lower lobes, small partially loculated effusion on the left - s/p left sided Thoracentesis on 10/01 by IR removed , WBC present, no organisms seen.  Cultures NG.  High LDH, low protein, partially exudative - Repeat CXR 10/05 shows partially loculated left pleural effusion, minimal increased - Pulmonary consulted for further recs - completed course Ceftriaxone , Azithromycin  - Follow up blood cultures - Wean oxygen as able   Elevated troponin likely secondary to demand ischemia in the setting of sepsis and hypoxic respiratory failure - S/p Heparin  gtt for 48 hrs - Seen by Cardiology, follow up outpatient with Dr.Custovic in 2 weeks - Patient had complete echo on 04/19/2024, with estimated ejection fraction of 55 to 60%   ESRD on hemodialysis - Nephrology is consulted for dialysis needs, MWF - CXR with mild pleural effusion and diffuse interstitial pulmonary - Underwent HD Friday 10/03, another run Saturday 10/04 (3L removed) due to fluid overload and today 10/06 - Fluid restriction 1200cc/day  Normocytic anemia - EPO per Nephrology  Chronic diastolic CHF - Echo in July showed EF 55 to 60%. - Resume Lasix  10/03 - SGLT2 inhibitor not indicated in  patients with dialysis  HTN (hypertension) Continue Cardizem  240 mg, carvedilol  6.25 mg bid   Type 2 diabetes mellitus with hyperglycemia, with long-term current use of insulin  (HCC) HbA1c 7.3 Home long-acting insulin , inc 16 units twice daily, meal time Novolog  8u TID SSI  S/p bilateral BKA - No acute concerns  Suprapubic catheter - denies any symptoms - Ucx was sent on adm, growing E. coli, Providencia stuartii - likely colonization   Obesity, Class III, BMI 40-49.9 (morbid obesity) This complicates overall care and prognosis.    Hypothyroidism Levothyroxine  175 mcg daily resumed   Tobacco use disorder As needed nicotine  patch ordered   Chronic pain syndrome Continue home oxycodone  bowel regimen for constipation  Gluteal cleft pressure ulcer WOC consult   DVT prophylaxis: Heparin  SQ Code Status: DNR/DNI   Subjective:  Patient seen and examined at bedside Reports SOB with laying flat, not feeling better CXR with loculated pleural effusion, consulted pulmonary for further recs  Physical Exam: Respiratory: clear to auscultation bilaterally, no wheezing, no crackles. Normal respiratory effort. No accessory muscle use.  Cardiovascular: Regular rate and rhythm, no murmurs / rubs / gallops.  Generalized extremity edema. 2+ pedal pulses. No carotid bruits.  Abdomen: Morbidly obese abdomen, no tenderness, no masses palpated, no hepatosplenomegaly. Bowel sounds positive.  Musculoskeletal: no clubbing / cyanosis. No joint deformity upper and lower extremities. Good ROM, no contractures, no atrophy. Normal muscle tone.  Skin: no rashes, lesions, ulcers. No induration Neurologic: Sensation intact. Strength 5/5 in all 4.  Psychiatric: Normal judgment and insight.     Vitals:   06/29/24 1202 06/29/24 1226 06/29/24 1231 06/29/24 1544  BP: 133/77 134/81 134/81 (!) 174/97  Pulse: 71 78 71 74  Resp: 14 16 13 20   Temp:  98.8 F (37.1 C)  98.9 F (37.2 C)  TempSrc:  Oral     SpO2: 96% 97% 97% 100%  Weight:      Height:        Data Reviewed:  I have reviewed patient's CT scan as shown above I have also reviewed below mentioned labs    Latest Ref Rng & Units 06/29/2024    9:10 AM 06/28/2024    4:22 AM 06/26/2024    4:20 AM  BMP  Glucose 70 - 99 mg/dL 784  790  768   BUN 8 - 23 mg/dL 36  31  42   Creatinine 0.61 - 1.24 mg/dL 6.51  6.98  6.47   Sodium 135 - 145 mmol/L 138  136  134   Potassium 3.5 - 5.1 mmol/L 4.2  4.5  4.5   Chloride 98 - 111 mmol/L 103  103  100   CO2 22 - 32 mmol/L 23  26  25    Calcium  8.9 - 10.3 mg/dL 7.8  7.6  7.4        Latest Ref Rng & Units 06/29/2024    9:10 AM 06/26/2024    8:45 AM 06/25/2024    5:00 AM  CBC  WBC 4.0 - 10.5 K/uL 7.4  5.6  8.9   Hemoglobin 13.0 - 17.0 g/dL 9.5  9.3  9.1   Hematocrit 39.0 - 52.0 % 30.3  29.7  29.3   Platelets 150 - 400 K/uL 173  179  186     Family Communication: No family present at bedside  Disposition: Pending clinical course  Author: Laree Lock, MD 06/29/2024 4:21 PM  For on call review [email or web address].

## 2024-06-29 NOTE — Progress Notes (Signed)
 [facility] CLINIC CARDIOLOGY PROGRESS NOTE       Patient ID: Eugene [name] MRN: [id number] DOB/AGE: 61-Nov-1962 61 y.o.  Admit date: 06/22/2024 Referring Physician Dr. Drue Potter Primary Physician Care, [facility]  Primary Cardiologist None Reason for Consultation elevated troponin  HPI: Eugene [name] is a 63 y.o. male  with a past medical history of morbid obesity, end-stage renal disease on hemodialysis, insulin -dependent diabetes mellitus 2, hypothyroid, hypertension who presented to the ED on 06/22/2024 for SOB and altered mental status. Troponins found to be elevated in the setting of sepsis. Cardiology was consulted for further evaluation.   Interval history: - Patient seen and examined this afternoon after dialysis, resting comfortably in hospital bed. - States he generally does not feel well. Denies CP. States SOB is stable.  - BP and HR controlled. Not on tele.   Review of systems complete and found to be negative unless listed above    Past Medical History:  Diagnosis Date   Anxiety    Depression    Diabetes mellitus without complication (HCC)    Hepatitis C    Hyperlipidemia    IBS (irritable bowel syndrome)    Osteomyelitis (HCC)    Spinal stenosis     Past Surgical History:  Procedure Laterality Date   BACK SURGERY     bilateral amputation Bilateral    CENTRAL LINE INSERTION-TUNNELED N/A 02/02/2019   Procedure: CENTRAL LINE INSERTION-TUNNELED;  Surgeon: Marea Selinda RAMAN, MD;  Location: [facility] INVASIVE CV LAB;  Service: Cardiovascular;  Laterality: N/A;   COLONOSCOPY WITH PROPOFOL  N/A 01/12/2020   Procedure: COLONOSCOPY WITH PROPOFOL ;  Surgeon: Jinny Carmine, MD;  Location: [facility] ENDOSCOPY;  Service: Endoscopy;  Laterality: N/A;   DIALYSIS/PERMA CATHETER INSERTION N/A 05/22/2024   Procedure: DIALYSIS/PERMA CATHETER INSERTION;  Surgeon: Jama Cordella MATSU, MD;  Location: [facility] INVASIVE CV LAB;  Service: Cardiovascular;  Laterality: N/A;   ESOPHAGOGASTRODUODENOSCOPY  (EGD) WITH PROPOFOL  N/A 12/07/2019   Procedure: ESOPHAGOGASTRODUODENOSCOPY (EGD) WITH PROPOFOL ;  Surgeon: Unk Corinn Skiff, MD;  Location: [facility] ENDOSCOPY;  Service: Gastroenterology;  Laterality: N/A;   IR CATHETER TUBE CHANGE  12/04/2019   SPINAL FUSION     THORACIC SPINE SURGERY  01/2019   extensive washout    Medications Prior to Admission  Medication Sig Dispense Refill Last Dose/Taking   acetaminophen  (TYLENOL ) 325 MG tablet Take 650 mg by mouth in the morning and at bedtime.   06/22/2024   bisacodyl  (DULCOLAX) 10 MG suppository Place 10 mg rectally daily as needed for moderate constipation.   Taking As Needed   bisacodyl  (DULCOLAX) 5 MG EC tablet Take 2 tablets (10 mg total) by mouth at bedtime.   06/21/2024   carvedilol  (COREG ) 6.25 MG tablet Take 1 tablet (6.25 mg total) by mouth 2 (two) times daily with a meal. (Patient taking differently: Take 6.25 mg by mouth 2 (two) times daily with a meal. Every Tuesday, Thursday, Saturday and sunday)   06/21/2024   cyanocobalamin  1000 MCG tablet Take 1 tablet (1,000 mcg total) by mouth daily.   06/22/2024   diltiazem  (CARDIZEM  CD) 240 MG 24 hr capsule Take 1 capsule (240 mg total) by mouth every evening.   06/21/2024   Doxepin  HCl 6 MG TABS Take 6 mg by mouth at bedtime.   06/21/2024   famotidine  (PEPCID ) 10 MG tablet Take 1 tablet (10 mg total) by mouth daily.   06/22/2024   folic acid  (FOLVITE ) 1 MG tablet Take 1 tablet (1 mg total) by mouth daily.   06/22/2024  furosemide  (LASIX ) 80 MG tablet Take 1 tablet (80 mg total) by mouth daily. (Patient taking differently: Take 80 mg by mouth at bedtime.)   06/21/2024   gabapentin  (NEURONTIN ) 300 MG capsule Take 300 mg by mouth at bedtime.   06/21/2024   guaifenesin (ROBITUSSIN) 100 MG/5ML syrup Take 200 mg by mouth 3 (three) times daily as needed for cough.   Taking As Needed   insulin  glargine-yfgn (SEMGLEE ) 100 UNIT/ML injection Inject 0.1 mLs (10 Units total) into the skin 2 (two) times daily. (Patient  taking differently: Inject 12-14 Units into the skin 2 (two) times daily.)   06/22/2024 Morning   insulin  lispro (HUMALOG ) 100 UNIT/ML injection Inject 0-15 Units into the skin 4 (four) times daily -  before meals and at bedtime. <150= 0 units 151-200= 3 units 201-250= 6 units 251-300= 9 units 301-350= 12 units 351-400= 15 units >400 call MD (Patient taking differently: Inject 0-15 Units into the skin 4 (four) times daily -  before meals and at bedtime. <150= 0 units 151-200= 3 units 201-250= 6 units 251-300= 9 units 301-350= 12 units 351-400= 15 units >400 call MD)   06/22/2024   ipratropium-albuterol  (DUONEB) 0.5-2.5 (3) MG/3ML SOLN Take 3 mLs by nebulization every 6 (six) hours as needed.   Taking As Needed   Lactulose  20 GM/30ML SOLN Take 30 mLs by mouth 2 (two) times daily.   06/22/2024   levothyroxine  (SYNTHROID ) 175 MCG tablet Take 175 mcg by mouth at bedtime.   06/21/2024   linaclotide  (LINZESS ) 145 MCG CAPS capsule Take 1 capsule (145 mcg total) by mouth daily before breakfast. 30 capsule 0 06/22/2024   Melatonin 5 MG TABS Take 10 mg by mouth at bedtime.   06/21/2024   methylPREDNISolone  sodium succinate (SOLU-MEDROL ) 125 mg/2 mL injection Inject 125 mg into the muscle once.   Taking   metoCLOPramide  (REGLAN ) 10 MG tablet Take 0.5 tablets (5 mg total) by mouth every 8 (eight) hours as needed for nausea or vomiting.   06/22/2024   midodrine  (PROAMATINE ) 5 MG tablet Take 5 mg by mouth 3 (three) times daily with meals as needed.   Taking As Needed   ondansetron  (ZOFRAN ) 4 MG tablet Take 4 mg by mouth every 8 (eight) hours as needed for nausea or vomiting.   Taking As Needed   Oxycodone  HCl 10 MG TABS Take 0.5-1 tablets (5-10 mg total) by mouth every 6 (six) hours as needed. 30 tablet 0 06/22/2024   polyethylene glycol (MIRALAX  / GLYCOLAX ) 17 g packet Take 17 g by mouth 2 (two) times daily. 14 each 0 06/22/2024 Morning   senna-docusate (SENOKOT-S) 8.6-50 MG tablet Take 1 tablet by mouth 2 (two)  times daily.   06/22/2024 Morning   simethicone  (MYLICON) 80 MG chewable tablet Chew 1 tablet (80 mg total) by mouth 4 (four) times daily.   06/22/2024   sitaGLIPtin (JANUVIA) 25 MG tablet Take 25 mg by mouth daily.   06/22/2024   Vitamin D , Ergocalciferol , (DRISDOL ) 1.25 MG (50000 UNIT) CAPS capsule Take 1 capsule (50,000 Units total) by mouth every 7 (seven) days.   06/19/2024   Social History   Socioeconomic History   Marital status: Single    Spouse name: Not on file   Number of children: Not on file   Years of education: Not on file   Highest education level: Not on file  Occupational History   Not on file  Tobacco Use   Smoking status: Some Days    Current packs/day: 0.25  Types: Cigarettes   Smokeless tobacco: Never  Vaping Use   Vaping status: Never Used  Substance and Sexual Activity   Alcohol use: Not Currently   Drug use: Not Currently   Sexual activity: Not Currently  Other Topics Concern   Not on file  Social History Narrative   Resides at [employer]   Social Drivers of Health   Financial Resource Strain: Medium Risk (02/02/2019)   Overall Financial Resource Strain (CARDIA)    Difficulty of Paying Living Expenses: Somewhat hard  Food Insecurity: Patient Declined (06/23/2024)   Hunger Vital Sign    Worried About Running Out of Food in the Last Year: Patient declined    Ran Out of Food in the Last Year: Patient declined  Transportation Needs: No Transportation Needs (06/23/2024)   PRAPARE - Administrator, Civil Service (Medical): No    Lack of Transportation (Non-Medical): No  Physical Activity: Unknown (02/02/2019)   Exercise Vital Sign    Days of Exercise per Week: 0 days    Minutes of Exercise per Session: Not on file  Stress: Stress Concern Present (02/02/2019)   [employer] of Occupational Health - Occupational Stress Questionnaire    Feeling of Stress : To some extent  Social Connections: Somewhat Isolated (02/02/2019)   Social  Connection and Isolation Panel    Frequency of Communication with Friends and Family: More than three times a week    Frequency of Social Gatherings with Friends and Family: More than three times a week    Attends Religious Services: More than 4 times per year    Active Member of [employer] or Organizations: Not on file    Attends Banker Meetings: Never    Marital Status: Never married  Intimate Partner Violence: Patient Declined (06/23/2024)   Humiliation, Afraid, Rape, and Kick questionnaire    Fear of Current or Ex-Partner: Patient declined    Emotionally Abused: Patient declined    Physically Abused: Patient declined    Sexually Abused: Patient declined    Family History  Problem Relation Age of Onset   Kidney failure Father    Kidney failure Brother      Vitals:   06/29/24 1130 06/29/24 1202 06/29/24 1226 06/29/24 1231  BP:  133/77 134/81 134/81  Pulse: 66 71 78 71  Resp: 14 14 16 13   Temp:   98.8 F (37.1 C)   TempSrc:   Oral   SpO2: 96% 96% 97% 97%  Weight:      Height:        PHYSICAL EXAM General: Chronically ill appearing male, well nourished, in no acute distress. HEENT: Normocephalic and atraumatic. Neck: No JVD.  Lungs: Normal respiratory effort on 6L [place]. Clear bilaterally to auscultation. Bibasilar crackles Heart: HRRR. Normal S1 and S2 without gallops or murmurs.  Abdomen: Non-distended appearing.  Msk: Normal strength and tone for age. Extremities: Warm and well perfused. No clubbing, cyanosis. No edema.  Neuro: Alert and oriented X 3. Psych: Answers questions appropriately.   Labs: Basic Metabolic Panel: Recent Labs    06/28/24 0422 06/29/24 0910  NA 136 138  K 4.5 4.2  CL 103 103  CO2 26 23  GLUCOSE 209* 215*  BUN 31* 36*  CREATININE 3.01* 3.48*  CALCIUM  7.6* 7.8*  PHOS  --  3.7   Liver Function Tests: Recent Labs    06/29/24 0910  ALBUMIN  2.0*   No results for input(s): LIPASE, AMYLASE in the last 72  hours. CBC: Recent  Labs    06/29/24 0910  WBC 7.4  HGB 9.5*  HCT 30.3*  MCV 94.4  PLT 173   Cardiac Enzymes: No results for input(s): CKTOTAL, CKMB, CKMBINDEX, TROPONINIHS in the last 72 hours.  BNP: No results for input(s): BNP in the last 72 hours.  D-Dimer: No results for input(s): DDIMER in the last 72 hours. Hemoglobin A1C: No results for input(s): HGBA1C in the last 72 hours. Fasting Lipid Panel: No results for input(s): CHOL, HDL, LDLCALC, TRIG, CHOLHDL, LDLDIRECT in the last 72 hours. Thyroid Function Tests: No results for input(s): TSH, T4TOTAL, T3FREE, THYROIDAB in the last 72 hours.  Invalid input(s): FREET3 Anemia Panel: No results for input(s): VITAMINB12, FOLATE, FERRITIN, TIBC, IRON , RETICCTPCT in the last 72 hours.   Radiology: DG Chest 1 View Result Date: 06/28/2024 CLINICAL DATA:  Shortness of breath. EXAM: CHEST  1 VIEW COMPARISON:  06/26/2024 FINDINGS: Right-sided dialysis catheter unchanged. Partially loculated left pleural effusion, minimally increased. Fluid tracks into the fissure. Small right pleural effusion is unchanged. Stable bibasilar opacities. Stable heart size and mediastinal contours. Postsurgical change in the thoracic spine. IMPRESSION: 1. Partially loculated left pleural effusion, minimally increased. 2. Small right pleural effusion is unchanged. 3. Stable bibasilar opacities. Electronically Signed   By: Andrea Gasman M.D.   On: 06/28/2024 19:26   DG Chest 1 View Result Date: 06/26/2024 EXAM: 1 VIEW XRAY OF THE CHEST 06/26/2024 04:41:00 PM COMPARISON: 06/24/2024 CLINICAL HISTORY: Pulmonary edema FINDINGS: LINES, TUBES AND DEVICES: Stable right internal jugular hemodialysis catheter. LUNGS AND PLEURA: No focal pulmonary opacity. No pulmonary edema. Small to moderate left and small right pleural effusions, unchanged. Bibasilar atelectasis. Stable mild diffuse interstitial prominence. No  pneumothorax. HEART AND MEDIASTINUM: No acute abnormality of the cardiac and mediastinal silhouettes. BONES AND SOFT TISSUES: Thoracic fusion hardware noted. No acute osseous abnormality. IMPRESSION: 1. Small to moderate left and small right pleural effusions, and bibasilar atelectasis. 2. Stable mild diffuse interstitial prominence Electronically signed by: Donnice Mania MD 06/26/2024 06:46 PM EDT RP Workstation: [id number]   US  THORACENTESIS ASP PLEURAL SPACE W/IMG GUIDE Result Date: 06/25/2024 INDICATION: 63 year old male with history of ESRD on HD, presenting with shortness of breath and altered mental status, noted with recurrent bilateral pleural effusions. IR was requested for diagnostic and therapeutic thoracentesis. EXAM: ULTRASOUND GUIDED DIAGNOSTIC AND THERAPEUTIC LEFT-SIDED THORACENTESIS MEDICATIONS: 7 cc of 1% lidocaine . COMPLICATIONS: None immediate. PROCEDURE: An ultrasound guided thoracentesis was thoroughly discussed with the patient and questions answered. The benefits, risks, alternatives and complications were also discussed. The patient understands and wishes to proceed with the procedure. Written consent was obtained. Ultrasound was performed to localize and mark an adequate pocket of fluid in the left chest. The area was then prepped and draped in the normal sterile fashion. 1% Lidocaine  was used for local anesthesia. Under ultrasound guidance a 6 Fr Safe-T-Centesis catheter was introduced. Thoracentesis was performed. The catheter was removed and a dressing applied. FINDINGS: A total of approximately 250 cc of clear, straw-colored pleural fluid was removed. Samples were sent to the laboratory as requested by the clinical team. IMPRESSION: Successful ultrasound guided left thoracentesis yielding 250 cc of pleural fluid. Procedure performed by Carlin Griffon, PA-C Electronically Signed   By: JONETTA Faes M.D.   On: 06/25/2024 07:13   DG Chest Port 1 View Result Date: 06/24/2024 CLINICAL DATA:   Status post left thoracentesis. EXAM: PORTABLE CHEST 1 VIEW COMPARISON:  06/22/2024 FINDINGS: No pneumothorax. Stable enlarged cardiac silhouette and bibasilar atelectasis. Small bilateral pleural effusions,  mildly decreased on the left. Stable right jugular double-lumen catheter with the lumen tips in the superior vena cava and region of the superior cavoatrial junction. Stable thoracic spine fixation hardware. IMPRESSION: 1. No pneumothorax following left thoracentesis. 2. Small bilateral pleural effusions, mildly decreased on the left. 3. Stable bibasilar atelectasis. Electronically Signed   By: Elspeth Bathe M.D.   On: 06/24/2024 16:43   CT CHEST WO CONTRAST Result Date: 06/23/2024 CLINICAL DATA:  Chronic dyspnea EXAM: CT CHEST WITHOUT CONTRAST TECHNIQUE: Multidetector CT imaging of the chest was performed following the standard protocol without IV contrast. RADIATION DOSE REDUCTION: This exam was performed according to the departmental dose-optimization program which includes automated exposure control, adjustment of the mA and/or kV according to patient size and/or use of iterative reconstruction technique. COMPARISON:  June 22, 2024 and prior studies FINDINGS: Cardiovascular: Normal caliber thoracic aorta. Right IJ approach tunneled central venous catheter with distal tip at the cavoatrial junction. Coronary artery calcifications. Mediastinum/Nodes: No lymphadenopathy. Lungs/Pleura: Similar appearance of bilateral lower lobe consolidations. Small partially loculated left and trace right pleural effusions, similar to prior. Scattered areas with interlobular septal thickening and few areas of ground-glass may be related to mild interstitial pulmonary edema. Upper Abdomen: No acute findings. Musculoskeletal: Unchanged T5-T11 posterior spinal fixation hardware. Associated streak artifact limits evaluation of the posterior chest wall. IMPRESSION: 1. Unchanged chronic bilateral lower lobe consolidations,  nonspecific and possibly representing chronic atelectasis. Small partially loculated left and trace right pleural effusions, similar to prior chest CT. 2.  Findings suggestive of mild interstitial pulmonary edema. Electronically Signed   By: Michaeline Blanch M.D.   On: 06/23/2024 11:03   CT Head Wo Contrast Result Date: 06/22/2024 CLINICAL DATA:  Altered mental status.  Hallucination. EXAM: CT HEAD WITHOUT CONTRAST TECHNIQUE: Contiguous axial images were obtained from the base of the skull through the vertex without intravenous contrast. RADIATION DOSE REDUCTION: This exam was performed according to the departmental dose-optimization program which includes automated exposure control, adjustment of the mA and/or kV according to patient size and/or use of iterative reconstruction technique. COMPARISON:  05/11/2024 FINDINGS: Brain: Ventricles, cisterns and other CSF spaces are normal. No mass, mass effect, shift of midline structures or acute hemorrhage. No evidence of acute infarction. Vascular: No hyperdense vessel or unexpected calcification. Skull: Normal. Negative for fracture or focal lesion. Sinuses/Orbits: Orbits are normal. Paranasal sinuses are well aerated and otherwise clear. Other: None. IMPRESSION: No acute findings. Electronically Signed   By: Toribio Agreste M.D.   On: 06/22/2024 17:32   DG Chest Portable 1 View Result Date: 06/22/2024 CLINICAL DATA:  Shortness of breath EXAM: PORTABLE CHEST 1 VIEW COMPARISON:  05/27/2024, CT 05/11/2024, 04/19/2024, 10/22/2019 FINDINGS: Limited by patient positioning. Right-sided central venous. Similar small left greater than right pleural effusions with probable loculation on the left. Persistent bibasilar airspace disease. No pneumothorax IMPRESSION: Similar small left greater than right pleural effusions with probable loculation on the left. Persistent bibasilar airspace disease. Cardiomegaly Electronically Signed   By: Luke Bun M.D.   On: 06/22/2024 16:24     ECHO 03/2024:  1. Left ventricular ejection fraction, by estimation, is 55 to 60%. The left ventricle has normal function. The left ventricle has no regional wall motion abnormalities. Left ventricular diastolic function could not be evaluated.   2. Right ventricular systolic function is normal. The right ventricular  size is not well visualized.   3. The mitral valve is normal in structure. No evidence of mitral valve  regurgitation.  4. The aortic valve is normal in structure. Aortic valve regurgitation is  not visualized.   TELEMETRY reviewed by me 06/29/2024: not on tele  EKG reviewed by me: NSR 1st degree AVB rate 86 bpm  Data reviewed by me 06/29/2024: last 24h vitals tele labs imaging I/O ED provider note, admission H&P, hospitalist progress note  Principal Problem:   Severe sepsis with acute organ dysfunction (HCC) Active Problems:   Type 2 diabetes mellitus with hyperglycemia, with long-term current use of insulin  (HCC)   HTN (hypertension)   Dyslipidemia   Chronic pain syndrome   GERD (gastroesophageal reflux disease)   Tobacco use disorder   Diabetic gastroparesis (HCC)   Hypothyroidism   Anasarca associated with disorder of kidney   Obesity, Class III, BMI 40-49.9 (morbid obesity) (HCC)   Elevated troponin    ASSESSMENT AND PLAN:  Eugene [name] is a 63 y.o. male  with a past medical history of morbid obesity, end-stage renal disease on hemodialysis, insulin -dependent diabetes mellitus 2, hypothyroid, hypertension who presented to the ED on 06/22/2024 for SOB and altered mental status. Troponins found to be elevated in the setting of sepsis. Cardiology was consulted for further evaluation.   # Sepsis # Demand ischemia # ESRD on HD # Hypertension Patient presented with SOB and AMS, found to have respiratory decompensation and placed on BiPAP. Dialysis yesterday evening after presentation with improvement in mental status and respiratory status. Had echo in July  with preserved EF, no WMAs.  -Continue with regular dialysis scheduled. -Continue diltiazem  240 mg daily and carvedilol  6.25 mg twice daily. -Mildly elevated troponin most consistent with demand/supply mismatch and not ACS in the setting of sepsis, acute hypoxic respiratory failure. No plan for further cardiac diagnostics at this time.  -Further management of sepsis per primary team.  Cardiology will sign off. Please haiku with questions or re-engage if needed. Follow up with Dr. Custovic in 2 weeks.   This patient's plan of care was discussed and created with Dr. Ammon and he is in agreement.  Signed: Danita Bloch, PA-C  06/29/2024, 1:37 PM [facility] Cardiology

## 2024-06-29 NOTE — Progress Notes (Signed)
  Received patient in bed to unit.   Informed consent signed and in chart.    TX duration:3:30     Transported by  Hand-off given to patient's nurse.    Access used: Right internal jugular Cath Access issues: none   Total UF removed: 3000 L Medication(s) given: Epo 4,000, Oxycodone  10 mg Post HD VS: wnl Post HD weight: 122.8 kg     N. Zenobia Kuennen LPN [facility]

## 2024-06-30 ENCOUNTER — Encounter: Payer: Self-pay | Admitting: Nephrology

## 2024-06-30 DIAGNOSIS — R6520 Severe sepsis without septic shock: Secondary | ICD-10-CM | POA: Diagnosis not present

## 2024-06-30 DIAGNOSIS — A419 Sepsis, unspecified organism: Secondary | ICD-10-CM | POA: Diagnosis not present

## 2024-06-30 LAB — GLUCOSE, CAPILLARY
Glucose-Capillary: 138 mg/dL — ABNORMAL HIGH (ref 70–99)
Glucose-Capillary: 202 mg/dL — ABNORMAL HIGH (ref 70–99)

## 2024-06-30 MED ORDER — OXYCODONE HCL 10 MG PO TABS: 5.0000 mg | ORAL_TABLET | Freq: Four times a day (QID) | ORAL | 0 refills | Status: DC | PRN

## 2024-06-30 NOTE — H&P (View-Only) (Signed)
 [facility]  ROUNDING NOTE   Subjective:   Eugene [name] is a 63 y.o. male with past medical conditions including diabetes, bilateral BKA, suprapubic catheter, hepatitis C, and end-stage renal disease on hemodialysis.  Patient presented to the emergency department from nursing facility with altered mental status and respiratory distress.  Patient currently admitted to ICU for Anasarca [R60.1] Severe sepsis with acute organ dysfunction (HCC) [A41.9, R65.20] Acute on chronic respiratory failure with hypoxia (HCC) [J96.21] Altered mental status, unspecified altered mental status type [R41.82] Pneumonia due to infectious organism, unspecified laterality, unspecified part of lung [J18.9]  Patient is known to our practice and receives outpatient dialysis treatments at [facility] on a MWF schedule, supervised by Dr. Dennise.   Update:  Patient seen laying in bed Weaned to 5L [place] Feels shortness of breath has not improved   Objective:  Vital signs in last 24 hours:  Temp:  [98 F (36.7 C)-99 F (37.2 C)] 98 F (36.7 C) (10/07 1253) Pulse Rate:  [62-74] 64 (10/07 1253) Resp:  [17-20] 18 (10/07 1253) BP: (124-174)/(74-97) 134/81 (10/07 1253) SpO2:  [95 %-100 %] 98 % (10/07 1253) FiO2 (%):  [5 %] 5 % (10/06 1544)  Weight change:  Filed Weights   06/27/24 1255 06/27/24 1542 06/29/24 0825  Weight: 127.8 kg 124.8 kg 122.8 kg    Intake/Output: I/O last 3 completed shifts: In: 170 [P.O.:170] Out: 5400 [Urine:2400; Other:3000]   Intake/Output this shift:  Total I/O In: 250 [P.O.:250] Out: -   Physical Exam: General: NAD  Head: Normocephalic, atraumatic. Moist oral mucosal membranes  Eyes: Anicteric  Lungs:  Diminished  Heart: Regular rate and rhythm  Abdomen:  Soft, nontender, obese  Extremities: 1+ peripheral edema.  Bilateral AKA  Neurologic: Awake, alert, conversant  Skin: Warm,dry, no rash  Access: Right IJ PermCath    Basic Metabolic Panel: Recent  Labs  Lab 06/24/24 0505 06/25/24 0500 06/26/24 0420 06/28/24 0422 06/29/24 0910  NA 134* 132* 134* 136 138  K 5.1 3.7 4.5 4.5 4.2  CL 99 99 100 103 103  CO2 25 25 25 26 23   GLUCOSE 366* 283* 231* 209* 215*  BUN 65* 41* 42* 31* 36*  CREATININE 5.12* 3.51* 3.52* 3.01* 3.48*  CALCIUM  7.4* 7.3* 7.4* 7.6* 7.8*  PHOS  --   --   --   --  3.7    Liver Function Tests: Recent Labs  Lab 06/29/24 0910  ALBUMIN  2.0*   No results for input(s): LIPASE, AMYLASE in the last 168 hours. No results for input(s): AMMONIA in the last 168 hours.  CBC: Recent Labs  Lab 06/24/24 0505 06/24/24 1010 06/25/24 0500 06/26/24 0845 06/29/24 0910  WBC 9.6 12.8* 8.9 5.6 7.4  HGB 9.0* 8.7* 9.1* 9.3* 9.5*  HCT 29.7* 27.7* 29.3* 29.7* 30.3*  MCV 96.1 96.2 94.8 95.8 94.4  PLT 201 205 186 179 173    Cardiac Enzymes: No results for input(s): CKTOTAL, CKMB, CKMBINDEX, TROPONINI in the last 168 hours.  BNP: Invalid input(s): POCBNP  CBG: Recent Labs  Lab 06/29/24 1407 06/29/24 1553 06/29/24 2208 06/30/24 0842 06/30/24 1251  GLUCAP 255* 257* 211* 138* 202*    Microbiology: Results for orders placed or performed during the hospital encounter of 06/22/24  Culture, blood (routine x 2)     Status: None   Collection Time: 06/22/24  3:53 PM   Specimen: BLOOD  Result Value Ref Range Status   Specimen Description BLOOD BLOOD LEFT ARM  Final   Special Requests  Final    BOTTLES DRAWN AEROBIC AND ANAEROBIC Blood Culture adequate volume   Culture   Final    NO GROWTH 5 DAYS Performed at [facility], [address] [place], [place] [address]    Report Status 06/27/2024 FINAL  Final  Resp panel by RT-PCR (RSV, Flu A&B, Covid) Anterior Nasal Swab     Status: None   Collection Time: 06/22/24  3:53 PM   Specimen: Anterior Nasal Swab  Result Value Ref Range Status   SARS Coronavirus 2 by RT PCR NEGATIVE NEGATIVE Final    Comment: (NOTE) SARS-CoV-2 target nucleic acids  are NOT DETECTED.  The SARS-CoV-2 RNA is generally detectable in upper respiratory specimens during the acute phase of infection. The lowest concentration of SARS-CoV-2 viral copies this assay can detect is 138 copies/mL. A negative result does not preclude SARS-Cov-2 infection and should not be used as the sole basis for treatment or other patient management decisions. A negative result may occur with  improper specimen collection/handling, submission of specimen other than nasopharyngeal swab, presence of viral mutation(s) within the areas targeted by this assay, and inadequate number of viral copies(<138 copies/mL). A negative result must be combined with clinical observations, patient history, and epidemiological information. The expected result is Negative.  Fact Sheet for Patients:  BloggerCourse.com  Fact Sheet for Healthcare Providers:  SeriousBroker.it  This test is no t yet approved or cleared by the [place]  FDA and  has been authorized for detection and/or diagnosis of SARS-CoV-2 by FDA under an Emergency Use Authorization (EUA). This EUA will remain  in effect (meaning this test can be used) for the duration of the COVID-19 declaration under Section 564(b)(1) of the Act, 21 U.S.C.section 360bbb-3(b)(1), unless the authorization is terminated  or revoked sooner.       Influenza A by PCR NEGATIVE NEGATIVE Final   Influenza B by PCR NEGATIVE NEGATIVE Final    Comment: (NOTE) The Xpert Xpress SARS-CoV-2/FLU/RSV plus assay is intended as an aid in the diagnosis of influenza from Nasopharyngeal swab specimens and should not be used as a sole basis for treatment. Nasal washings and aspirates are unacceptable for Xpert Xpress SARS-CoV-2/FLU/RSV testing.  Fact Sheet for Patients: BloggerCourse.com  Fact Sheet for Healthcare Providers: SeriousBroker.it  This test is  not yet approved or cleared by the [place]  FDA and has been authorized for detection and/or diagnosis of SARS-CoV-2 by FDA under an Emergency Use Authorization (EUA). This EUA will remain in effect (meaning this test can be used) for the duration of the COVID-19 declaration under Section 564(b)(1) of the Act, 21 U.S.C. section 360bbb-3(b)(1), unless the authorization is terminated or revoked.     Resp Syncytial Virus by PCR NEGATIVE NEGATIVE Final    Comment: (NOTE) Fact Sheet for Patients: BloggerCourse.com  Fact Sheet for Healthcare Providers: SeriousBroker.it  This test is not yet approved or cleared by the [place]  FDA and has been authorized for detection and/or diagnosis of SARS-CoV-2 by FDA under an Emergency Use Authorization (EUA). This EUA will remain in effect (meaning this test can be used) for the duration of the COVID-19 declaration under Section 564(b)(1) of the Act, 21 U.S.C. section 360bbb-3(b)(1), unless the authorization is terminated or revoked.  Performed at [facility], [address], [place], [place] [address]   Urine Culture     Status: Abnormal   Collection Time: 06/22/24  4:00 PM   Specimen: Urine, Suprapubic  Result Value Ref Range Status   Specimen Description  Final    URINE, SUPRAPUBIC Performed at [facility], [address] [place], [place] [address]    Special Requests   Final    NONE Performed at [facility], [address] [place], [place] [address]    Culture (A)  Final    >=100,000 COLONIES/mL ESCHERICHIA COLI >=100,000 COLONIES/mL PROVIDENCIA STUARTII Confirmed Extended Spectrum Beta-Lactamase Producer (ESBL).  In bloodstream infections from ESBL organisms, carbapenems are preferred over piperacillin /tazobactam. They are shown to have a lower risk of mortality.    Report Status 06/25/2024 FINAL  Final   Organism ID, Bacteria PROVIDENCIA  STUARTII (A)  Final   Organism ID, Bacteria ESCHERICHIA COLI (A)  Final      Susceptibility   Escherichia coli - MIC*    AMPICILLIN >=32 RESISTANT Resistant     CEFAZOLIN  (URINE) Value in next row Resistant      >=32 RESISTANTThis is a modified FDA-approved test that has been validated and its performance characteristics determined by the reporting laboratory.  This laboratory is certified under the Clinical Laboratory Improvement Amendments CLIA as qualified to perform high complexity clinical laboratory testing.    CEFEPIME  Value in next row Resistant      >=32 RESISTANTThis is a modified FDA-approved test that has been validated and its performance characteristics determined by the reporting laboratory.  This laboratory is certified under the Clinical Laboratory Improvement Amendments CLIA as qualified to perform high complexity clinical laboratory testing.    ERTAPENEM Value in next row Sensitive      >=32 RESISTANTThis is a modified FDA-approved test that has been validated and its performance characteristics determined by the reporting laboratory.  This laboratory is certified under the Clinical Laboratory Improvement Amendments CLIA as qualified to perform high complexity clinical laboratory testing.    CEFTRIAXONE  Value in next row Resistant      >=32 RESISTANTThis is a modified FDA-approved test that has been validated and its performance characteristics determined by the reporting laboratory.  This laboratory is certified under the Clinical Laboratory Improvement Amendments CLIA as qualified to perform high complexity clinical laboratory testing.    CIPROFLOXACIN  Value in next row Resistant      >=32 RESISTANTThis is a modified FDA-approved test that has been validated and its performance characteristics determined by the reporting laboratory.  This laboratory is certified under the Clinical Laboratory Improvement Amendments CLIA as qualified to perform high complexity clinical laboratory  testing.    GENTAMICIN Value in next row Sensitive      >=32 RESISTANTThis is a modified FDA-approved test that has been validated and its performance characteristics determined by the reporting laboratory.  This laboratory is certified under the Clinical Laboratory Improvement Amendments CLIA as qualified to perform high complexity clinical laboratory testing.    NITROFURANTOIN Value in next row Sensitive      >=32 RESISTANTThis is a modified FDA-approved test that has been validated and its performance characteristics determined by the reporting laboratory.  This laboratory is certified under the Clinical Laboratory Improvement Amendments CLIA as qualified to perform high complexity clinical laboratory testing.    TRIMETH /SULFA  Value in next row Sensitive      >=32 RESISTANTThis is a modified FDA-approved test that has been validated and its performance characteristics determined by the reporting laboratory.  This laboratory is certified under the Clinical Laboratory Improvement Amendments CLIA as qualified to perform high complexity clinical laboratory testing.    AMPICILLIN/SULBACTAM Value in next row Resistant      >=32 RESISTANTThis is a modified FDA-approved  test that has been validated and its performance characteristics determined by the reporting laboratory.  This laboratory is certified under the Clinical Laboratory Improvement Amendments CLIA as qualified to perform high complexity clinical laboratory testing.    PIP/TAZO Value in next row Sensitive      16 SENSITIVEThis is a modified FDA-approved test that has been validated and its performance characteristics determined by the reporting laboratory.  This laboratory is certified under the Clinical Laboratory Improvement Amendments CLIA as qualified to perform high complexity clinical laboratory testing.    MEROPENEM Value in next row Sensitive      16 SENSITIVEThis is a modified FDA-approved test that has been validated and its performance  characteristics determined by the reporting laboratory.  This laboratory is certified under the Clinical Laboratory Improvement Amendments CLIA as qualified to perform high complexity clinical laboratory testing.    * >=100,000 COLONIES/mL ESCHERICHIA COLI   Providencia stuartii - MIC*    AMPICILLIN Value in next row Resistant      16 SENSITIVEThis is a modified FDA-approved test that has been validated and its performance characteristics determined by the reporting laboratory.  This laboratory is certified under the Clinical Laboratory Improvement Amendments CLIA as qualified to perform high complexity clinical laboratory testing.    CEFEPIME  Value in next row Sensitive      16 SENSITIVEThis is a modified FDA-approved test that has been validated and its performance characteristics determined by the reporting laboratory.  This laboratory is certified under the Clinical Laboratory Improvement Amendments CLIA as qualified to perform high complexity clinical laboratory testing.    ERTAPENEM Value in next row Sensitive      16 SENSITIVEThis is a modified FDA-approved test that has been validated and its performance characteristics determined by the reporting laboratory.  This laboratory is certified under the Clinical Laboratory Improvement Amendments CLIA as qualified to perform high complexity clinical laboratory testing.    CEFTRIAXONE  Value in next row Sensitive      16 SENSITIVEThis is a modified FDA-approved test that has been validated and its performance characteristics determined by the reporting laboratory.  This laboratory is certified under the Clinical Laboratory Improvement Amendments CLIA as qualified to perform high complexity clinical laboratory testing.    CIPROFLOXACIN  Value in next row Resistant      16 SENSITIVEThis is a modified FDA-approved test that has been validated and its performance characteristics determined by the reporting laboratory.  This laboratory is certified under the  Clinical Laboratory Improvement Amendments CLIA as qualified to perform high complexity clinical laboratory testing.    GENTAMICIN Value in next row Resistant      16 SENSITIVEThis is a modified FDA-approved test that has been validated and its performance characteristics determined by the reporting laboratory.  This laboratory is certified under the Clinical Laboratory Improvement Amendments CLIA as qualified to perform high complexity clinical laboratory testing.    NITROFURANTOIN Value in next row Resistant      16 SENSITIVEThis is a modified FDA-approved test that has been validated and its performance characteristics determined by the reporting laboratory.  This laboratory is certified under the Clinical Laboratory Improvement Amendments CLIA as qualified to perform high complexity clinical laboratory testing.    TRIMETH /SULFA  Value in next row Sensitive      16 SENSITIVEThis is a modified FDA-approved test that has been validated and its performance characteristics determined by the reporting laboratory.  This laboratory is certified under the Clinical Laboratory Improvement Amendments CLIA as qualified to perform high complexity clinical laboratory  testing.    AMPICILLIN/SULBACTAM Value in next row Intermediate      16 SENSITIVEThis is a modified FDA-approved test that has been validated and its performance characteristics determined by the reporting laboratory.  This laboratory is certified under the Clinical Laboratory Improvement Amendments CLIA as qualified to perform high complexity clinical laboratory testing.    PIP/TAZO Value in next row Sensitive      <=4 SENSITIVEThis is a modified FDA-approved test that has been validated and its performance characteristics determined by the reporting laboratory.  This laboratory is certified under the Clinical Laboratory Improvement Amendments CLIA as qualified to perform high complexity clinical laboratory testing.    MEROPENEM Value in next row  Sensitive      <=4 SENSITIVEThis is a modified FDA-approved test that has been validated and its performance characteristics determined by the reporting laboratory.  This laboratory is certified under the Clinical Laboratory Improvement Amendments CLIA as qualified to perform high complexity clinical laboratory testing.    * >=100,000 COLONIES/mL PROVIDENCIA STUARTII  Culture, blood (routine x 2)     Status: None   Collection Time: 06/23/24 11:23 AM   Specimen: BLOOD  Result Value Ref Range Status   Specimen Description BLOOD BLOOD RIGHT HAND  Final   Special Requests   Final    BOTTLES DRAWN AEROBIC AND ANAEROBIC Blood Culture adequate volume   Culture   Final    NO GROWTH 5 DAYS Performed at [facility], [address] [place], [place] [address]    Report Status 06/28/2024 FINAL  Final  Body fluid culture w Gram Stain     Status: None   Collection Time: 06/24/24  3:55 PM   Specimen: PATH Cytology Pleural fluid  Result Value Ref Range Status   Specimen Description   Final    PLEURAL Performed at [facility], [address] [place], [place] [address]    Special Requests   Final    NONE Performed at [facility], [address] [place], [place] [address]    Gram Stain   Final    WBC PRESENT,BOTH PMN AND MONONUCLEAR NO ORGANISMS SEEN CYTOSPIN SMEAR    Culture   Final    NO GROWTH 3 DAYS Performed at [facility], [address] [place], [place] [address]    Report Status 06/27/2024 FINAL  Final    Coagulation Studies: No results for input(s): LABPROT, INR in the last 72 hours.   Urinalysis: No results for input(s): COLORURINE, LABSPEC, PHURINE, GLUCOSEU, HGBUR, BILIRUBINUR, KETONESUR, PROTEINUR, UROBILINOGEN, NITRITE, LEUKOCYTESUR in the last 72 hours.  Invalid input(s): APPERANCEUR     Imaging: DG Chest 1 View Result Date: 06/28/2024 CLINICAL DATA:  Shortness of breath. EXAM: CHEST  1 VIEW  COMPARISON:  06/26/2024 FINDINGS: Right-sided dialysis catheter unchanged. Partially loculated left pleural effusion, minimally increased. Fluid tracks into the fissure. Small right pleural effusion is unchanged. Stable bibasilar opacities. Stable heart size and mediastinal contours. Postsurgical change in the thoracic spine. IMPRESSION: 1. Partially loculated left pleural effusion, minimally increased. 2. Small right pleural effusion is unchanged. 3. Stable bibasilar opacities. Electronically Signed   By: Andrea Gasman M.D.   On: 06/28/2024 19:26     Medications:      carvedilol   6.25 mg Oral BID WC   Chlorhexidine  Gluconate Cloth  6 each Topical Q0600   diltiazem   240 mg Oral QPM   epoetin  alfa-epbx (RETACRIT ) injection  4,000 Units Intravenous Q M,W,F-1800   famotidine   10 mg  Oral Daily   fluticasone  furoate-vilanterol  1 puff Inhalation Daily   furosemide   80 mg Oral Daily   Gerhardt's butt cream   Topical TID   heparin   5,000 Units Subcutaneous Q8H   insulin  aspart  0-15 Units Subcutaneous TID WC   insulin  aspart  0-5 Units Subcutaneous QHS   insulin  aspart  8 Units Subcutaneous TID WC   insulin  glargine  16 Units Subcutaneous BID   lactulose   20 g Oral BID   levothyroxine   175 mcg Oral Q0600   linaclotide   145 mcg Oral QAC breakfast   melatonin  10 mg Oral QHS   bisacodyl , bisacodyl , ipratropium-albuterol , oxyCODONE , oxyCODONE , polyethylene glycol  Assessment/ Plan:  Eugene [name] is a 63 y.o.  male with past medical conditions including diabetes, bilateral BKA, suprapubic catheter, hepatitis C, and end-stage renal disease on hemodialysis.  Patient presented to the emergency department from nursing facility with altered mental status and respiratory distress.  Patient currently admitted to ICU for Anasarca [R60.1] Severe sepsis with acute organ dysfunction (HCC) [A41.9, R65.20] Acute on chronic respiratory failure with hypoxia (HCC) [J96.21] Altered mental status,  unspecified altered mental status type [R41.82] Pneumonia due to infectious organism, unspecified laterality, unspecified part of lung [J18.9]  [facility]/MWF/right PermCath  End-stage renal disease with hyperkalemia on hemodialysis. Next treatment scheduled for Wednesday.   2.  Acute respiratory failure, patient placed on BiPAP on ED arrival.  Thoracentesis on 10/1 with 0.25L fluid removal.  Weaned to 5L [place]  3. Anemia of chronic kidney disease Lab Results  Component Value Date   HGB 9.5 (L) 06/29/2024    Continue Retacrit  4000 unit with dialysis  4. Diabetes mellitus type II with chronic kidney disease/renal manifestations: insulin  dependent. Home regimen includes glargine and lispro. Most recent hemoglobin A1c is 6.0 on 04/14/2024.   Primary team to continue management.     LOS: 8 Rayne Cowdrey 10/7/20251:28 PM

## 2024-06-30 NOTE — Plan of Care (Signed)

## 2024-06-30 NOTE — Progress Notes (Addendum)
 D/C order noted. Contacted [facility]  MWF to be advised of pt and d/c today. Requested documents faxed to clinic for continuation of care.  Suzen Satchel Dialysis Navigator [phone]

## 2024-06-30 NOTE — TOC Transition Note (Signed)
 Transition of Care [facility]) - Discharge Note   Patient Details  Name: Eugene [name] MRN: [id number] Date of Birth: [birth year]  [facility] CM/SW Contact:  Lauraine JAYSON Carpen, LCSW Phone Number: 06/30/2024, 2:52 PM   Clinical Narrative:  Patient has orders to discharge back to [facility] today. RN will call report to [phone] (Room 56B). [facility] has been arranged and he is 5th on the list. No further concerns. CSW signing off.   Final next level of care: Skilled Nursing Facility Barriers to Discharge: Barriers Resolved   Patient Goals and CMS Choice            Discharge Placement   Existing PASRR number confirmed : 06/26/24          Patient chooses bed at: [facility] Patient to be transferred to facility by: [facility] Name of family member notified: Patient has already notified his brother Patient and family notified of of transfer: 06/30/24  Discharge Plan and Services Additional resources added to the After Visit Summary for   In-house Referral: Clinical Social Work Discharge Planning Services: CM Consult Post Acute Care Choice: Resumption of Svcs/PTA Provider                               Social Drivers of Health (SDOH) Interventions SDOH Screenings   Food Insecurity: Patient Declined (06/23/2024)  Housing: Low Risk  (06/23/2024)  Transportation Needs: No Transportation Needs (06/23/2024)  Utilities: Not At Risk (06/23/2024)  Financial Resource Strain: Medium Risk (02/02/2019)  Physical Activity: Unknown (02/02/2019)  Social Connections: Somewhat Isolated (02/02/2019)  Stress: Stress Concern Present (02/02/2019)  Tobacco Use: High Risk (06/22/2024)     Readmission Risk Interventions    05/05/2024    3:05 PM  Readmission Risk Prevention Plan  Transportation Screening Complete  Medication Review (RN Care Manager) Complete  PCP or Specialist appointment within 3-5 days of discharge  Complete  SW Recovery Care/Counseling Consult Complete  Palliative Care Screening Not Applicable  Skilled Nursing Facility Complete

## 2024-06-30 NOTE — Discharge Summary (Signed)
 Physician Discharge Summary   Patient: Eugene [name] MRN: [id number] DOB: 1960-10-16  Admit date:     06/22/2024  Discharge date: 06/30/24  Discharge Physician: Laree Lock   PCP: Care, [facility]   Recommendations at discharge:   Follow up with LTC provider - Monitor CBG, BP Follow up with Nephro for ongoing Hemodialysis  Follow up with Cardiology outpatient  Discharge Diagnoses: Principal Problem:   Severe sepsis with acute organ dysfunction (HCC) Active Problems:   Elevated troponin   Type 2 diabetes mellitus with hyperglycemia, with long-term current use of insulin  (HCC)   HTN (hypertension)   Dyslipidemia   Chronic pain syndrome   GERD (gastroesophageal reflux disease)   Tobacco use disorder   Diabetic gastroparesis (HCC)   Hypothyroidism   Anasarca associated with disorder of kidney   Obesity, Class III, BMI 40-49.9 (morbid obesity) [facility])   Hospital Course: Eugene [name] is a 63 year old male with history of morbid obesity, end-stage renal disease on hemodialysis, insulin -dependent diabetes mellitus 2, hypothyroid, hypertension admitted for sepsis due to pneumonia. Hospital course as below   Severe sepsis with acute organ dysfunction - resolved Secondary to pneumonia Acute on chronic hypoxic respiratory failure - Patient required BiPAP on arrival but have been weaned off to 5L [place] - CT scan of the chest showing findings consolidation in bilateral lower lobes, small partially loculated effusion on the left - s/p left sided Thoracentesis on 10/01 by IR removed , WBC present, no organisms seen.  Cultures NG.  High LDH, low protein, partially exudative - Repeat CXR 10/05 shows partially loculated left pleural effusion, minimal increased - Seen by Pulmonary, appreciate recs. Likely lung did not fully re-expand after thoracentesis, due to loculations. Not a candidate for surgical intervention. Unlikely pigtail catheter will be helpful, no further  intervention - completed course Ceftriaxone , Azithromycin  - On 5L [place], baseline, reports feeling better, no SOB   Elevated troponin likely secondary to demand ischemia in the setting of sepsis and hypoxic respiratory failure - S/p Heparin  gtt for 48 hrs - Seen by Cardiology, follow up outpatient with Dr.Custovic in 2 weeks - Patient had complete echo on 04/19/2024, with estimated ejection fraction of 55 to 60%   ESRD on hemodialysis - Nephrology is consulted for dialysis needs, MWF - CXR with mild pleural effusion and diffuse interstitial pulmonary - Underwent HD Friday 10/03, another run Saturday 10/04 (3L removed) due to fluid overload and 10/06 - Fluid restriction 1200cc/day   Normocytic anemia - EPO per Nephrology   Chronic diastolic CHF - Echo in July showed EF 55 to 60%. - Resume Lasix  10/03 - SGLT2 inhibitor not indicated in patients with dialysis   HTN (hypertension) Continue Cardizem  240 mg, carvedilol  6.25 mg bid   Type 2 diabetes mellitus with hyperglycemia, with long-term current use of insulin  (HCC) HbA1c 7.3 Home regimen   S/p bilateral BKA - No acute concerns   Suprapubic catheter - denies any symptoms - Ucx was sent on adm, growing E. coli, Providencia stuartii - likely colonization   Obesity, Class III, BMI 40-49.9 (morbid obesity)   Hypothyroidism   Tobacco use disorder   Chronic pain syndrome Continue home oxycodone    Gluteal cleft pressure ulcer   Consultants: Pulmonary, Cardiology, Nephrology Procedures performed: Thoracentesis  Disposition: Long term care facility Diet recommendation:  Discharge Diet Orders (From admission, onward)     Start     Ordered   06/30/24 0000  Diet - low sodium heart healthy  06/30/24 1342            DISCHARGE MEDICATION: Allergies as of 06/30/2024   No Known Allergies      Medication List     STOP taking these medications    methylPREDNISolone  sodium succinate 125 mg/2 mL  injection Commonly known as: SOLU-MEDROL        TAKE these medications    acetaminophen  325 MG tablet Commonly known as: TYLENOL  Take 650 mg by mouth in the morning and at bedtime.   bisacodyl  10 MG suppository Commonly known as: DULCOLAX Place 10 mg rectally daily as needed for moderate constipation.   bisacodyl  5 MG EC tablet Commonly known as: DULCOLAX Take 2 tablets (10 mg total) by mouth at bedtime.   carvedilol  6.25 MG tablet Commonly known as: COREG  Take 1 tablet (6.25 mg total) by mouth 2 (two) times daily with a meal. What changed: additional instructions   cyanocobalamin  1000 MCG tablet Take 1 tablet (1,000 mcg total) by mouth daily.   diltiazem  240 MG 24 hr capsule Commonly known as: CARDIZEM  CD Take 1 capsule (240 mg total) by mouth every evening.   Doxepin  HCl 6 MG Tabs Take 6 mg by mouth at bedtime.   famotidine  10 MG tablet Commonly known as: PEPCID  Take 1 tablet (10 mg total) by mouth daily.   folic acid  1 MG tablet Commonly known as: FOLVITE  Take 1 tablet (1 mg total) by mouth daily.   furosemide  80 MG tablet Commonly known as: LASIX  Take 1 tablet (80 mg total) by mouth daily. What changed: when to take this   gabapentin  300 MG capsule Commonly known as: NEURONTIN  Take 300 mg by mouth at bedtime.   guaifenesin 100 MG/5ML syrup Commonly known as: ROBITUSSIN Take 200 mg by mouth 3 (three) times daily as needed for cough.   insulin  glargine-yfgn 100 UNIT/ML injection Commonly known as: SEMGLEE  Inject 0.1 mLs (10 Units total) into the skin 2 (two) times daily. What changed: how much to take   insulin  lispro 100 UNIT/ML injection Commonly known as: HUMALOG  Inject 0-15 Units into the skin 4 (four) times daily -  before meals and at bedtime. <150= 0 units 151-200= 3 units 201-250= 6 units 251-300= 9 units 301-350= 12 units 351-400= 15 units >400 call MD   ipratropium-albuterol  0.5-2.5 (3) MG/3ML Soln Commonly known as: DUONEB Take 3  mLs by nebulization every 6 (six) hours as needed.   Lactulose  20 GM/30ML Soln Take 30 mLs by mouth 2 (two) times daily.   levothyroxine  175 MCG tablet Commonly known as: SYNTHROID  Take 175 mcg by mouth at bedtime.   linaclotide  145 MCG Caps capsule Commonly known as: LINZESS  Take 1 capsule (145 mcg total) by mouth daily before breakfast.   melatonin 5 MG Tabs Take 10 mg by mouth at bedtime.   metoCLOPramide  10 MG tablet Commonly known as: REGLAN  Take 0.5 tablets (5 mg total) by mouth every 8 (eight) hours as needed for nausea or vomiting.   midodrine  5 MG tablet Commonly known as: PROAMATINE  Take 5 mg by mouth 3 (three) times daily with meals as needed.   ondansetron  4 MG tablet Commonly known as: ZOFRAN  Take 4 mg by mouth every 8 (eight) hours as needed for nausea or vomiting.   Oxycodone  HCl 10 MG Tabs Take 0.5 tablets (5 mg total) by mouth every 6 (six) hours as needed. What changed: how much to take   polyethylene glycol 17 g packet Commonly known as: MIRALAX  / GLYCOLAX  Take 17 g by  mouth 2 (two) times daily.   senna-docusate 8.6-50 MG tablet Commonly known as: Senokot-S Take 1 tablet by mouth 2 (two) times daily.   simethicone  80 MG chewable tablet Commonly known as: MYLICON Chew 1 tablet (80 mg total) by mouth 4 (four) times daily.   sitaGLIPtin 25 MG tablet Commonly known as: JANUVIA Take 25 mg by mouth daily.   Vitamin D  (Ergocalciferol ) 1.25 MG (50000 UNIT) Caps capsule Commonly known as: DRISDOL  Take 1 capsule (50,000 Units total) by mouth every 7 (seven) days.               Discharge Care Instructions  (From admission, onward)           Start     Ordered   06/30/24 0000  Discharge wound care:       Comments: Wound care  3 times daily      Cleanse coccyx/buttocks with soap and water, dry and apply Gerhardt's Butt Cream 3 times a day and prn soiling.  Cover Stage 2 buttocks with silicone foam, lift daily to assess.   06/30/24 1342             Contact information for follow-up providers     Custovic, Sabina, DO. Go in 2 week(s).   Specialty: Cardiology Contact information: [address] [place] [address] [phone]              Contact information for after-discharge care     Destination     [facility] .   Service: Skilled Nursing Contact information: [address] [place]  [address] [phone]                    Discharge Exam: Fredricka Weights   06/27/24 1255 06/27/24 1542 06/29/24 0825  Weight: 127.8 kg 124.8 kg 122.8 kg   Respiratory: clear to auscultation bilaterally, no wheezing, no crackles. Normal respiratory effort. No accessory muscle use.  Cardiovascular: Regular rate and rhythm, no murmurs / rubs / gallops.  Generalized extremity edema. 2+ pedal pulses. No carotid bruits.  Abdomen: Morbidly obese abdomen, no tenderness, no masses palpated, no hepatosplenomegaly. Bowel sounds positive.  Musculoskeletal: no clubbing / cyanosis. No joint deformity upper and lower extremities. Good ROM, no contractures, no atrophy. Normal muscle tone.  Skin: no rashes, lesions, ulcers. No induration Neurologic: Sensation intact. Strength 5/5 in all 4.   Condition at discharge: fair  The results of significant diagnostics from this hospitalization (including imaging, microbiology, ancillary and laboratory) are listed below for reference.   Imaging Studies: DG Chest 1 View Result Date: 06/28/2024 CLINICAL DATA:  Shortness of breath. EXAM: CHEST  1 VIEW COMPARISON:  06/26/2024 FINDINGS: Right-sided dialysis catheter unchanged. Partially loculated left pleural effusion, minimally increased. Fluid tracks into the fissure. Small right pleural effusion is unchanged. Stable bibasilar opacities. Stable heart size and mediastinal contours. Postsurgical change in the thoracic spine. IMPRESSION: 1. Partially loculated left pleural effusion, minimally increased. 2.  Small right pleural effusion is unchanged. 3. Stable bibasilar opacities. Electronically Signed   By: Andrea Gasman M.D.   On: 06/28/2024 19:26   DG Chest 1 View Result Date: 06/26/2024 EXAM: 1 VIEW XRAY OF THE CHEST 06/26/2024 04:41:00 PM COMPARISON: 06/24/2024 CLINICAL HISTORY: Pulmonary edema FINDINGS: LINES, TUBES AND DEVICES: Stable right internal jugular hemodialysis catheter. LUNGS AND PLEURA: No focal pulmonary opacity. No pulmonary edema. Small to moderate left and small right pleural effusions, unchanged. Bibasilar atelectasis. Stable mild diffuse interstitial prominence. No pneumothorax. HEART AND  MEDIASTINUM: No acute abnormality of the cardiac and mediastinal silhouettes. BONES AND SOFT TISSUES: Thoracic fusion hardware noted. No acute osseous abnormality. IMPRESSION: 1. Small to moderate left and small right pleural effusions, and bibasilar atelectasis. 2. Stable mild diffuse interstitial prominence Electronically signed by: Donnice Mania MD 06/26/2024 06:46 PM EDT RP Workstation: [id number]   US  THORACENTESIS ASP PLEURAL SPACE W/IMG GUIDE Result Date: 06/25/2024 INDICATION: 63 year old male with history of ESRD on HD, presenting with shortness of breath and altered mental status, noted with recurrent bilateral pleural effusions. IR was requested for diagnostic and therapeutic thoracentesis. EXAM: ULTRASOUND GUIDED DIAGNOSTIC AND THERAPEUTIC LEFT-SIDED THORACENTESIS MEDICATIONS: 7 cc of 1% lidocaine . COMPLICATIONS: None immediate. PROCEDURE: An ultrasound guided thoracentesis was thoroughly discussed with the patient and questions answered. The benefits, risks, alternatives and complications were also discussed. The patient understands and wishes to proceed with the procedure. Written consent was obtained. Ultrasound was performed to localize and mark an adequate pocket of fluid in the left chest. The area was then prepped and draped in the normal sterile fashion. 1% Lidocaine  was used for  local anesthesia. Under ultrasound guidance a 6 Fr Safe-T-Centesis catheter was introduced. Thoracentesis was performed. The catheter was removed and a dressing applied. FINDINGS: A total of approximately 250 cc of clear, straw-colored pleural fluid was removed. Samples were sent to the laboratory as requested by the clinical team. IMPRESSION: Successful ultrasound guided left thoracentesis yielding 250 cc of pleural fluid. Procedure performed by Carlin Griffon, PA-C Electronically Signed   By: JONETTA Faes M.D.   On: 06/25/2024 07:13   DG Chest Port 1 View Result Date: 06/24/2024 CLINICAL DATA:  Status post left thoracentesis. EXAM: PORTABLE CHEST 1 VIEW COMPARISON:  06/22/2024 FINDINGS: No pneumothorax. Stable enlarged cardiac silhouette and bibasilar atelectasis. Small bilateral pleural effusions, mildly decreased on the left. Stable right jugular double-lumen catheter with the lumen tips in the superior vena cava and region of the superior cavoatrial junction. Stable thoracic spine fixation hardware. IMPRESSION: 1. No pneumothorax following left thoracentesis. 2. Small bilateral pleural effusions, mildly decreased on the left. 3. Stable bibasilar atelectasis. Electronically Signed   By: Elspeth Bathe M.D.   On: 06/24/2024 16:43   CT CHEST WO CONTRAST Result Date: 06/23/2024 CLINICAL DATA:  Chronic dyspnea EXAM: CT CHEST WITHOUT CONTRAST TECHNIQUE: Multidetector CT imaging of the chest was performed following the standard protocol without IV contrast. RADIATION DOSE REDUCTION: This exam was performed according to the departmental dose-optimization program which includes automated exposure control, adjustment of the mA and/or kV according to patient size and/or use of iterative reconstruction technique. COMPARISON:  June 22, 2024 and prior studies FINDINGS: Cardiovascular: Normal caliber thoracic aorta. Right IJ approach tunneled central venous catheter with distal tip at the cavoatrial junction. Coronary  artery calcifications. Mediastinum/Nodes: No lymphadenopathy. Lungs/Pleura: Similar appearance of bilateral lower lobe consolidations. Small partially loculated left and trace right pleural effusions, similar to prior. Scattered areas with interlobular septal thickening and few areas of ground-glass may be related to mild interstitial pulmonary edema. Upper Abdomen: No acute findings. Musculoskeletal: Unchanged T5-T11 posterior spinal fixation hardware. Associated streak artifact limits evaluation of the posterior chest wall. IMPRESSION: 1. Unchanged chronic bilateral lower lobe consolidations, nonspecific and possibly representing chronic atelectasis. Small partially loculated left and trace right pleural effusions, similar to prior chest CT. 2.  Findings suggestive of mild interstitial pulmonary edema. Electronically Signed   By: Michaeline Blanch M.D.   On: 06/23/2024 11:03   CT Head Wo Contrast Result Date: 06/22/2024 CLINICAL DATA:  Altered mental status.  Hallucination. EXAM: CT HEAD WITHOUT CONTRAST TECHNIQUE: Contiguous axial images were obtained from the base of the skull through the vertex without intravenous contrast. RADIATION DOSE REDUCTION: This exam was performed according to the departmental dose-optimization program which includes automated exposure control, adjustment of the mA and/or kV according to patient size and/or use of iterative reconstruction technique. COMPARISON:  05/11/2024 FINDINGS: Brain: Ventricles, cisterns and other CSF spaces are normal. No mass, mass effect, shift of midline structures or acute hemorrhage. No evidence of acute infarction. Vascular: No hyperdense vessel or unexpected calcification. Skull: Normal. Negative for fracture or focal lesion. Sinuses/Orbits: Orbits are normal. Paranasal sinuses are well aerated and otherwise clear. Other: None. IMPRESSION: No acute findings. Electronically Signed   By: Toribio Agreste M.D.   On: 06/22/2024 17:32   DG Chest Portable 1  View Result Date: 06/22/2024 CLINICAL DATA:  Shortness of breath EXAM: PORTABLE CHEST 1 VIEW COMPARISON:  05/27/2024, CT 05/11/2024, 04/19/2024, 10/22/2019 FINDINGS: Limited by patient positioning. Right-sided central venous. Similar small left greater than right pleural effusions with probable loculation on the left. Persistent bibasilar airspace disease. No pneumothorax IMPRESSION: Similar small left greater than right pleural effusions with probable loculation on the left. Persistent bibasilar airspace disease. Cardiomegaly Electronically Signed   By: Luke Bun M.D.   On: 06/22/2024 16:24    Microbiology: Results for orders placed or performed during the hospital encounter of 06/22/24  Culture, blood (routine x 2)     Status: None   Collection Time: 06/22/24  3:53 PM   Specimen: BLOOD  Result Value Ref Range Status   Specimen Description BLOOD BLOOD LEFT ARM  Final   Special Requests   Final    BOTTLES DRAWN AEROBIC AND ANAEROBIC Blood Culture adequate volume   Culture   Final    NO GROWTH 5 DAYS Performed at [facility], [address] [place], [place] [address]    Report Status 06/27/2024 FINAL  Final  Resp panel by RT-PCR (RSV, Flu A&B, Covid) Anterior Nasal Swab     Status: None   Collection Time: 06/22/24  3:53 PM   Specimen: Anterior Nasal Swab  Result Value Ref Range Status   SARS Coronavirus 2 by RT PCR NEGATIVE NEGATIVE Final    Comment: (NOTE) SARS-CoV-2 target nucleic acids are NOT DETECTED.  The SARS-CoV-2 RNA is generally detectable in upper respiratory specimens during the acute phase of infection. The lowest concentration of SARS-CoV-2 viral copies this assay can detect is 138 copies/mL. A negative result does not preclude SARS-Cov-2 infection and should not be used as the sole basis for treatment or other patient management decisions. A negative result may occur with  improper specimen collection/handling, submission of specimen other than  nasopharyngeal swab, presence of viral mutation(s) within the areas targeted by this assay, and inadequate number of viral copies(<138 copies/mL). A negative result must be combined with clinical observations, patient history, and epidemiological information. The expected result is Negative.  Fact Sheet for Patients:  BloggerCourse.com  Fact Sheet for Healthcare Providers:  SeriousBroker.it  This test is no t yet approved or cleared by the [place]  FDA and  has been authorized for detection and/or diagnosis of SARS-CoV-2 by FDA under an Emergency Use Authorization (EUA). This EUA will remain  in effect (meaning this test can be used) for the duration of the COVID-19 declaration under Section 564(b)(1) of the Act, 21 U.S.C.section 360bbb-3(b)(1), unless the authorization is terminated  or revoked sooner.  Influenza A by PCR NEGATIVE NEGATIVE Final   Influenza B by PCR NEGATIVE NEGATIVE Final    Comment: (NOTE) The Xpert Xpress SARS-CoV-2/FLU/RSV plus assay is intended as an aid in the diagnosis of influenza from Nasopharyngeal swab specimens and should not be used as a sole basis for treatment. Nasal washings and aspirates are unacceptable for Xpert Xpress SARS-CoV-2/FLU/RSV testing.  Fact Sheet for Patients: BloggerCourse.com  Fact Sheet for Healthcare Providers: SeriousBroker.it  This test is not yet approved or cleared by the [place]  FDA and has been authorized for detection and/or diagnosis of SARS-CoV-2 by FDA under an Emergency Use Authorization (EUA). This EUA will remain in effect (meaning this test can be used) for the duration of the COVID-19 declaration under Section 564(b)(1) of the Act, 21 U.S.C. section 360bbb-3(b)(1), unless the authorization is terminated or revoked.     Resp Syncytial Virus by PCR NEGATIVE NEGATIVE Final    Comment:  (NOTE) Fact Sheet for Patients: BloggerCourse.com  Fact Sheet for Healthcare Providers: SeriousBroker.it  This test is not yet approved or cleared by the [place]  FDA and has been authorized for detection and/or diagnosis of SARS-CoV-2 by FDA under an Emergency Use Authorization (EUA). This EUA will remain in effect (meaning this test can be used) for the duration of the COVID-19 declaration under Section 564(b)(1) of the Act, 21 U.S.C. section 360bbb-3(b)(1), unless the authorization is terminated or revoked.  Performed at [facility], [address] [place], [place] [address]   Urine Culture     Status: Abnormal   Collection Time: 06/22/24  4:00 PM   Specimen: Urine, Suprapubic  Result Value Ref Range Status   Specimen Description   Final    URINE, SUPRAPUBIC Performed at [facility], [address] [place], [place] [address]    Special Requests   Final    NONE Performed at [facility], [address] [place], [place] [address]    Culture (A)  Final    >=100,000 COLONIES/mL ESCHERICHIA COLI >=100,000 COLONIES/mL PROVIDENCIA STUARTII Confirmed Extended Spectrum Beta-Lactamase Producer (ESBL).  In bloodstream infections from ESBL organisms, carbapenems are preferred over piperacillin /tazobactam. They are shown to have a lower risk of mortality.    Report Status 06/25/2024 FINAL  Final   Organism ID, Bacteria PROVIDENCIA STUARTII (A)  Final   Organism ID, Bacteria ESCHERICHIA COLI (A)  Final      Susceptibility   Escherichia coli - MIC*    AMPICILLIN >=32 RESISTANT Resistant     CEFAZOLIN  (URINE) Value in next row Resistant      >=32 RESISTANTThis is a modified FDA-approved test that has been validated and its performance characteristics determined by the reporting laboratory.  This laboratory is certified under the Clinical Laboratory Improvement Amendments CLIA as qualified to perform  high complexity clinical laboratory testing.    CEFEPIME  Value in next row Resistant      >=32 RESISTANTThis is a modified FDA-approved test that has been validated and its performance characteristics determined by the reporting laboratory.  This laboratory is certified under the Clinical Laboratory Improvement Amendments CLIA as qualified to perform high complexity clinical laboratory testing.    ERTAPENEM Value in next row Sensitive      >=32 RESISTANTThis is a modified FDA-approved test that has been validated and its performance characteristics determined by the reporting laboratory.  This laboratory is certified under the Clinical Laboratory Improvement Amendments CLIA as qualified to perform high complexity clinical laboratory testing.    CEFTRIAXONE  Value in  next row Resistant      >=32 RESISTANTThis is a modified FDA-approved test that has been validated and its performance characteristics determined by the reporting laboratory.  This laboratory is certified under the Clinical Laboratory Improvement Amendments CLIA as qualified to perform high complexity clinical laboratory testing.    CIPROFLOXACIN  Value in next row Resistant      >=32 RESISTANTThis is a modified FDA-approved test that has been validated and its performance characteristics determined by the reporting laboratory.  This laboratory is certified under the Clinical Laboratory Improvement Amendments CLIA as qualified to perform high complexity clinical laboratory testing.    GENTAMICIN Value in next row Sensitive      >=32 RESISTANTThis is a modified FDA-approved test that has been validated and its performance characteristics determined by the reporting laboratory.  This laboratory is certified under the Clinical Laboratory Improvement Amendments CLIA as qualified to perform high complexity clinical laboratory testing.    NITROFURANTOIN Value in next row Sensitive      >=32 RESISTANTThis is a modified FDA-approved test that has been  validated and its performance characteristics determined by the reporting laboratory.  This laboratory is certified under the Clinical Laboratory Improvement Amendments CLIA as qualified to perform high complexity clinical laboratory testing.    TRIMETH /SULFA  Value in next row Sensitive      >=32 RESISTANTThis is a modified FDA-approved test that has been validated and its performance characteristics determined by the reporting laboratory.  This laboratory is certified under the Clinical Laboratory Improvement Amendments CLIA as qualified to perform high complexity clinical laboratory testing.    AMPICILLIN/SULBACTAM Value in next row Resistant      >=32 RESISTANTThis is a modified FDA-approved test that has been validated and its performance characteristics determined by the reporting laboratory.  This laboratory is certified under the Clinical Laboratory Improvement Amendments CLIA as qualified to perform high complexity clinical laboratory testing.    PIP/TAZO Value in next row Sensitive      16 SENSITIVEThis is a modified FDA-approved test that has been validated and its performance characteristics determined by the reporting laboratory.  This laboratory is certified under the Clinical Laboratory Improvement Amendments CLIA as qualified to perform high complexity clinical laboratory testing.    MEROPENEM Value in next row Sensitive      16 SENSITIVEThis is a modified FDA-approved test that has been validated and its performance characteristics determined by the reporting laboratory.  This laboratory is certified under the Clinical Laboratory Improvement Amendments CLIA as qualified to perform high complexity clinical laboratory testing.    * >=100,000 COLONIES/mL ESCHERICHIA COLI   Providencia stuartii - MIC*    AMPICILLIN Value in next row Resistant      16 SENSITIVEThis is a modified FDA-approved test that has been validated and its performance characteristics determined by the reporting  laboratory.  This laboratory is certified under the Clinical Laboratory Improvement Amendments CLIA as qualified to perform high complexity clinical laboratory testing.    CEFEPIME  Value in next row Sensitive      16 SENSITIVEThis is a modified FDA-approved test that has been validated and its performance characteristics determined by the reporting laboratory.  This laboratory is certified under the Clinical Laboratory Improvement Amendments CLIA as qualified to perform high complexity clinical laboratory testing.    ERTAPENEM Value in next row Sensitive      16 SENSITIVEThis is a modified FDA-approved test that has been validated and its performance characteristics determined by the reporting laboratory.  This laboratory is certified under  the Clinical Laboratory Improvement Amendments CLIA as qualified to perform high complexity clinical laboratory testing.    CEFTRIAXONE  Value in next row Sensitive      16 SENSITIVEThis is a modified FDA-approved test that has been validated and its performance characteristics determined by the reporting laboratory.  This laboratory is certified under the Clinical Laboratory Improvement Amendments CLIA as qualified to perform high complexity clinical laboratory testing.    CIPROFLOXACIN  Value in next row Resistant      16 SENSITIVEThis is a modified FDA-approved test that has been validated and its performance characteristics determined by the reporting laboratory.  This laboratory is certified under the Clinical Laboratory Improvement Amendments CLIA as qualified to perform high complexity clinical laboratory testing.    GENTAMICIN Value in next row Resistant      16 SENSITIVEThis is a modified FDA-approved test that has been validated and its performance characteristics determined by the reporting laboratory.  This laboratory is certified under the Clinical Laboratory Improvement Amendments CLIA as qualified to perform high complexity clinical laboratory testing.     NITROFURANTOIN Value in next row Resistant      16 SENSITIVEThis is a modified FDA-approved test that has been validated and its performance characteristics determined by the reporting laboratory.  This laboratory is certified under the Clinical Laboratory Improvement Amendments CLIA as qualified to perform high complexity clinical laboratory testing.    TRIMETH /SULFA  Value in next row Sensitive      16 SENSITIVEThis is a modified FDA-approved test that has been validated and its performance characteristics determined by the reporting laboratory.  This laboratory is certified under the Clinical Laboratory Improvement Amendments CLIA as qualified to perform high complexity clinical laboratory testing.    AMPICILLIN/SULBACTAM Value in next row Intermediate      16 SENSITIVEThis is a modified FDA-approved test that has been validated and its performance characteristics determined by the reporting laboratory.  This laboratory is certified under the Clinical Laboratory Improvement Amendments CLIA as qualified to perform high complexity clinical laboratory testing.    PIP/TAZO Value in next row Sensitive      <=4 SENSITIVEThis is a modified FDA-approved test that has been validated and its performance characteristics determined by the reporting laboratory.  This laboratory is certified under the Clinical Laboratory Improvement Amendments CLIA as qualified to perform high complexity clinical laboratory testing.    MEROPENEM Value in next row Sensitive      <=4 SENSITIVEThis is a modified FDA-approved test that has been validated and its performance characteristics determined by the reporting laboratory.  This laboratory is certified under the Clinical Laboratory Improvement Amendments CLIA as qualified to perform high complexity clinical laboratory testing.    * >=100,000 COLONIES/mL PROVIDENCIA STUARTII  Culture, blood (routine x 2)     Status: None   Collection Time: 06/23/24 11:23 AM   Specimen: BLOOD   Result Value Ref Range Status   Specimen Description BLOOD BLOOD RIGHT HAND  Final   Special Requests   Final    BOTTLES DRAWN AEROBIC AND ANAEROBIC Blood Culture adequate volume   Culture   Final    NO GROWTH 5 DAYS Performed at [facility], [address], [place], [place] [address]    Report Status 06/28/2024 FINAL  Final  Body fluid culture w Gram Stain     Status: None   Collection Time: 06/24/24  3:55 PM   Specimen: PATH Cytology Pleural fluid  Result Value Ref Range Status   Specimen Description   Final  PLEURAL Performed at [facility], [address] [place], [place] [address]    Special Requests   Final    NONE Performed at [facility], [address] [place], [place] [address]    Gram Stain   Final    WBC PRESENT,BOTH PMN AND MONONUCLEAR NO ORGANISMS SEEN CYTOSPIN SMEAR    Culture   Final    NO GROWTH 3 DAYS Performed at [facility], [address] [place], [place] [address]    Report Status 06/27/2024 FINAL  Final    Labs: CBC: Recent Labs  Lab 06/24/24 0505 06/24/24 1010 06/25/24 0500 06/26/24 0845 06/29/24 0910  WBC 9.6 12.8* 8.9 5.6 7.4  HGB 9.0* 8.7* 9.1* 9.3* 9.5*  HCT 29.7* 27.7* 29.3* 29.7* 30.3*  MCV 96.1 96.2 94.8 95.8 94.4  PLT 201 205 186 179 173   Basic Metabolic Panel: Recent Labs  Lab 06/24/24 0505 06/25/24 0500 06/26/24 0420 06/28/24 0422 06/29/24 0910  NA 134* 132* 134* 136 138  K 5.1 3.7 4.5 4.5 4.2  CL 99 99 100 103 103  CO2 25 25 25 26 23   GLUCOSE 366* 283* 231* 209* 215*  BUN 65* 41* 42* 31* 36*  CREATININE 5.12* 3.51* 3.52* 3.01* 3.48*  CALCIUM  7.4* 7.3* 7.4* 7.6* 7.8*  PHOS  --   --   --   --  3.7   Liver Function Tests: Recent Labs  Lab 06/29/24 0910  ALBUMIN  2.0*   CBG: Recent Labs  Lab 06/29/24 1407 06/29/24 1553 06/29/24 2208 06/30/24 0842 06/30/24 1251  GLUCAP 255* 257* 211* 138* 202*    Discharge time spent: greater than 30  minutes.  Signed: Laree Lock, MD [facility] 06/30/2024

## 2024-06-30 NOTE — Progress Notes (Signed)
 [facility]  ROUNDING NOTE   Subjective:   Eugene [name] is a 63 y.o. male with past medical conditions including diabetes, bilateral BKA, suprapubic catheter, hepatitis C, and end-stage renal disease on hemodialysis.  Patient presented to the emergency department from nursing facility with altered mental status and respiratory distress.  Patient currently admitted to ICU for Anasarca [R60.1] Severe sepsis with acute organ dysfunction (HCC) [A41.9, R65.20] Acute on chronic respiratory failure with hypoxia (HCC) [J96.21] Altered mental status, unspecified altered mental status type [R41.82] Pneumonia due to infectious organism, unspecified laterality, unspecified part of lung [J18.9]  Patient is known to our practice and receives outpatient dialysis treatments at [facility] on a MWF schedule, supervised by Dr. Dennise.   Update:  Patient seen laying in bed Weaned to 5L [place] Feels shortness of breath has not improved   Objective:  Vital signs in last 24 hours:  Temp:  [98 F (36.7 C)-99 F (37.2 C)] 98 F (36.7 C) (10/07 1253) Pulse Rate:  [62-74] 64 (10/07 1253) Resp:  [17-20] 18 (10/07 1253) BP: (124-174)/(74-97) 134/81 (10/07 1253) SpO2:  [95 %-100 %] 98 % (10/07 1253) FiO2 (%):  [5 %] 5 % (10/06 1544)  Weight change:  Filed Weights   06/27/24 1255 06/27/24 1542 06/29/24 0825  Weight: 127.8 kg 124.8 kg 122.8 kg    Intake/Output: I/O last 3 completed shifts: In: 170 [P.O.:170] Out: 5400 [Urine:2400; Other:3000]   Intake/Output this shift:  Total I/O In: 250 [P.O.:250] Out: -   Physical Exam: General: NAD  Head: Normocephalic, atraumatic. Moist oral mucosal membranes  Eyes: Anicteric  Lungs:  Diminished  Heart: Regular rate and rhythm  Abdomen:  Soft, nontender, obese  Extremities: 1+ peripheral edema.  Bilateral AKA  Neurologic: Awake, alert, conversant  Skin: Warm,dry, no rash  Access: Right IJ PermCath    Basic Metabolic Panel: Recent  Labs  Lab 06/24/24 0505 06/25/24 0500 06/26/24 0420 06/28/24 0422 06/29/24 0910  NA 134* 132* 134* 136 138  K 5.1 3.7 4.5 4.5 4.2  CL 99 99 100 103 103  CO2 25 25 25 26 23   GLUCOSE 366* 283* 231* 209* 215*  BUN 65* 41* 42* 31* 36*  CREATININE 5.12* 3.51* 3.52* 3.01* 3.48*  CALCIUM  7.4* 7.3* 7.4* 7.6* 7.8*  PHOS  --   --   --   --  3.7    Liver Function Tests: Recent Labs  Lab 06/29/24 0910  ALBUMIN  2.0*   No results for input(s): LIPASE, AMYLASE in the last 168 hours. No results for input(s): AMMONIA in the last 168 hours.  CBC: Recent Labs  Lab 06/24/24 0505 06/24/24 1010 06/25/24 0500 06/26/24 0845 06/29/24 0910  WBC 9.6 12.8* 8.9 5.6 7.4  HGB 9.0* 8.7* 9.1* 9.3* 9.5*  HCT 29.7* 27.7* 29.3* 29.7* 30.3*  MCV 96.1 96.2 94.8 95.8 94.4  PLT 201 205 186 179 173    Cardiac Enzymes: No results for input(s): CKTOTAL, CKMB, CKMBINDEX, TROPONINI in the last 168 hours.  BNP: Invalid input(s): POCBNP  CBG: Recent Labs  Lab 06/29/24 1407 06/29/24 1553 06/29/24 2208 06/30/24 0842 06/30/24 1251  GLUCAP 255* 257* 211* 138* 202*    Microbiology: Results for orders placed or performed during the hospital encounter of 06/22/24  Culture, blood (routine x 2)     Status: None   Collection Time: 06/22/24  3:53 PM   Specimen: BLOOD  Result Value Ref Range Status   Specimen Description BLOOD BLOOD LEFT ARM  Final   Special Requests  Final    BOTTLES DRAWN AEROBIC AND ANAEROBIC Blood Culture adequate volume   Culture   Final    NO GROWTH 5 DAYS Performed at [facility], [address] [place], [place] [address]    Report Status 06/27/2024 FINAL  Final  Resp panel by RT-PCR (RSV, Flu A&B, Covid) Anterior Nasal Swab     Status: None   Collection Time: 06/22/24  3:53 PM   Specimen: Anterior Nasal Swab  Result Value Ref Range Status   SARS Coronavirus 2 by RT PCR NEGATIVE NEGATIVE Final    Comment: (NOTE) SARS-CoV-2 target nucleic acids  are NOT DETECTED.  The SARS-CoV-2 RNA is generally detectable in upper respiratory specimens during the acute phase of infection. The lowest concentration of SARS-CoV-2 viral copies this assay can detect is 138 copies/mL. A negative result does not preclude SARS-Cov-2 infection and should not be used as the sole basis for treatment or other patient management decisions. A negative result may occur with  improper specimen collection/handling, submission of specimen other than nasopharyngeal swab, presence of viral mutation(s) within the areas targeted by this assay, and inadequate number of viral copies(<138 copies/mL). A negative result must be combined with clinical observations, patient history, and epidemiological information. The expected result is Negative.  Fact Sheet for Patients:  BloggerCourse.com  Fact Sheet for Healthcare Providers:  SeriousBroker.it  This test is no t yet approved or cleared by the [place]  FDA and  has been authorized for detection and/or diagnosis of SARS-CoV-2 by FDA under an Emergency Use Authorization (EUA). This EUA will remain  in effect (meaning this test can be used) for the duration of the COVID-19 declaration under Section 564(b)(1) of the Act, 21 U.S.C.section 360bbb-3(b)(1), unless the authorization is terminated  or revoked sooner.       Influenza A by PCR NEGATIVE NEGATIVE Final   Influenza B by PCR NEGATIVE NEGATIVE Final    Comment: (NOTE) The Xpert Xpress SARS-CoV-2/FLU/RSV plus assay is intended as an aid in the diagnosis of influenza from Nasopharyngeal swab specimens and should not be used as a sole basis for treatment. Nasal washings and aspirates are unacceptable for Xpert Xpress SARS-CoV-2/FLU/RSV testing.  Fact Sheet for Patients: BloggerCourse.com  Fact Sheet for Healthcare Providers: SeriousBroker.it  This test is  not yet approved or cleared by the [place]  FDA and has been authorized for detection and/or diagnosis of SARS-CoV-2 by FDA under an Emergency Use Authorization (EUA). This EUA will remain in effect (meaning this test can be used) for the duration of the COVID-19 declaration under Section 564(b)(1) of the Act, 21 U.S.C. section 360bbb-3(b)(1), unless the authorization is terminated or revoked.     Resp Syncytial Virus by PCR NEGATIVE NEGATIVE Final    Comment: (NOTE) Fact Sheet for Patients: BloggerCourse.com  Fact Sheet for Healthcare Providers: SeriousBroker.it  This test is not yet approved or cleared by the [place]  FDA and has been authorized for detection and/or diagnosis of SARS-CoV-2 by FDA under an Emergency Use Authorization (EUA). This EUA will remain in effect (meaning this test can be used) for the duration of the COVID-19 declaration under Section 564(b)(1) of the Act, 21 U.S.C. section 360bbb-3(b)(1), unless the authorization is terminated or revoked.  Performed at [facility], [address], [place], [place] [address]   Urine Culture     Status: Abnormal   Collection Time: 06/22/24  4:00 PM   Specimen: Urine, Suprapubic  Result Value Ref Range Status   Specimen Description  Final    URINE, SUPRAPUBIC Performed at [facility], [address] [place], [place] [address]    Special Requests   Final    NONE Performed at [facility], [address] [place], [place] [address]    Culture (A)  Final    >=100,000 COLONIES/mL ESCHERICHIA COLI >=100,000 COLONIES/mL PROVIDENCIA STUARTII Confirmed Extended Spectrum Beta-Lactamase Producer (ESBL).  In bloodstream infections from ESBL organisms, carbapenems are preferred over piperacillin /tazobactam. They are shown to have a lower risk of mortality.    Report Status 06/25/2024 FINAL  Final   Organism ID, Bacteria PROVIDENCIA  STUARTII (A)  Final   Organism ID, Bacteria ESCHERICHIA COLI (A)  Final      Susceptibility   Escherichia coli - MIC*    AMPICILLIN >=32 RESISTANT Resistant     CEFAZOLIN  (URINE) Value in next row Resistant      >=32 RESISTANTThis is a modified FDA-approved test that has been validated and its performance characteristics determined by the reporting laboratory.  This laboratory is certified under the Clinical Laboratory Improvement Amendments CLIA as qualified to perform high complexity clinical laboratory testing.    CEFEPIME  Value in next row Resistant      >=32 RESISTANTThis is a modified FDA-approved test that has been validated and its performance characteristics determined by the reporting laboratory.  This laboratory is certified under the Clinical Laboratory Improvement Amendments CLIA as qualified to perform high complexity clinical laboratory testing.    ERTAPENEM Value in next row Sensitive      >=32 RESISTANTThis is a modified FDA-approved test that has been validated and its performance characteristics determined by the reporting laboratory.  This laboratory is certified under the Clinical Laboratory Improvement Amendments CLIA as qualified to perform high complexity clinical laboratory testing.    CEFTRIAXONE  Value in next row Resistant      >=32 RESISTANTThis is a modified FDA-approved test that has been validated and its performance characteristics determined by the reporting laboratory.  This laboratory is certified under the Clinical Laboratory Improvement Amendments CLIA as qualified to perform high complexity clinical laboratory testing.    CIPROFLOXACIN  Value in next row Resistant      >=32 RESISTANTThis is a modified FDA-approved test that has been validated and its performance characteristics determined by the reporting laboratory.  This laboratory is certified under the Clinical Laboratory Improvement Amendments CLIA as qualified to perform high complexity clinical laboratory  testing.    GENTAMICIN Value in next row Sensitive      >=32 RESISTANTThis is a modified FDA-approved test that has been validated and its performance characteristics determined by the reporting laboratory.  This laboratory is certified under the Clinical Laboratory Improvement Amendments CLIA as qualified to perform high complexity clinical laboratory testing.    NITROFURANTOIN Value in next row Sensitive      >=32 RESISTANTThis is a modified FDA-approved test that has been validated and its performance characteristics determined by the reporting laboratory.  This laboratory is certified under the Clinical Laboratory Improvement Amendments CLIA as qualified to perform high complexity clinical laboratory testing.    TRIMETH /SULFA  Value in next row Sensitive      >=32 RESISTANTThis is a modified FDA-approved test that has been validated and its performance characteristics determined by the reporting laboratory.  This laboratory is certified under the Clinical Laboratory Improvement Amendments CLIA as qualified to perform high complexity clinical laboratory testing.    AMPICILLIN/SULBACTAM Value in next row Resistant      >=32 RESISTANTThis is a modified FDA-approved  test that has been validated and its performance characteristics determined by the reporting laboratory.  This laboratory is certified under the Clinical Laboratory Improvement Amendments CLIA as qualified to perform high complexity clinical laboratory testing.    PIP/TAZO Value in next row Sensitive      16 SENSITIVEThis is a modified FDA-approved test that has been validated and its performance characteristics determined by the reporting laboratory.  This laboratory is certified under the Clinical Laboratory Improvement Amendments CLIA as qualified to perform high complexity clinical laboratory testing.    MEROPENEM Value in next row Sensitive      16 SENSITIVEThis is a modified FDA-approved test that has been validated and its performance  characteristics determined by the reporting laboratory.  This laboratory is certified under the Clinical Laboratory Improvement Amendments CLIA as qualified to perform high complexity clinical laboratory testing.    * >=100,000 COLONIES/mL ESCHERICHIA COLI   Providencia stuartii - MIC*    AMPICILLIN Value in next row Resistant      16 SENSITIVEThis is a modified FDA-approved test that has been validated and its performance characteristics determined by the reporting laboratory.  This laboratory is certified under the Clinical Laboratory Improvement Amendments CLIA as qualified to perform high complexity clinical laboratory testing.    CEFEPIME  Value in next row Sensitive      16 SENSITIVEThis is a modified FDA-approved test that has been validated and its performance characteristics determined by the reporting laboratory.  This laboratory is certified under the Clinical Laboratory Improvement Amendments CLIA as qualified to perform high complexity clinical laboratory testing.    ERTAPENEM Value in next row Sensitive      16 SENSITIVEThis is a modified FDA-approved test that has been validated and its performance characteristics determined by the reporting laboratory.  This laboratory is certified under the Clinical Laboratory Improvement Amendments CLIA as qualified to perform high complexity clinical laboratory testing.    CEFTRIAXONE  Value in next row Sensitive      16 SENSITIVEThis is a modified FDA-approved test that has been validated and its performance characteristics determined by the reporting laboratory.  This laboratory is certified under the Clinical Laboratory Improvement Amendments CLIA as qualified to perform high complexity clinical laboratory testing.    CIPROFLOXACIN  Value in next row Resistant      16 SENSITIVEThis is a modified FDA-approved test that has been validated and its performance characteristics determined by the reporting laboratory.  This laboratory is certified under the  Clinical Laboratory Improvement Amendments CLIA as qualified to perform high complexity clinical laboratory testing.    GENTAMICIN Value in next row Resistant      16 SENSITIVEThis is a modified FDA-approved test that has been validated and its performance characteristics determined by the reporting laboratory.  This laboratory is certified under the Clinical Laboratory Improvement Amendments CLIA as qualified to perform high complexity clinical laboratory testing.    NITROFURANTOIN Value in next row Resistant      16 SENSITIVEThis is a modified FDA-approved test that has been validated and its performance characteristics determined by the reporting laboratory.  This laboratory is certified under the Clinical Laboratory Improvement Amendments CLIA as qualified to perform high complexity clinical laboratory testing.    TRIMETH /SULFA  Value in next row Sensitive      16 SENSITIVEThis is a modified FDA-approved test that has been validated and its performance characteristics determined by the reporting laboratory.  This laboratory is certified under the Clinical Laboratory Improvement Amendments CLIA as qualified to perform high complexity clinical laboratory  testing.    AMPICILLIN/SULBACTAM Value in next row Intermediate      16 SENSITIVEThis is a modified FDA-approved test that has been validated and its performance characteristics determined by the reporting laboratory.  This laboratory is certified under the Clinical Laboratory Improvement Amendments CLIA as qualified to perform high complexity clinical laboratory testing.    PIP/TAZO Value in next row Sensitive      <=4 SENSITIVEThis is a modified FDA-approved test that has been validated and its performance characteristics determined by the reporting laboratory.  This laboratory is certified under the Clinical Laboratory Improvement Amendments CLIA as qualified to perform high complexity clinical laboratory testing.    MEROPENEM Value in next row  Sensitive      <=4 SENSITIVEThis is a modified FDA-approved test that has been validated and its performance characteristics determined by the reporting laboratory.  This laboratory is certified under the Clinical Laboratory Improvement Amendments CLIA as qualified to perform high complexity clinical laboratory testing.    * >=100,000 COLONIES/mL PROVIDENCIA STUARTII  Culture, blood (routine x 2)     Status: None   Collection Time: 06/23/24 11:23 AM   Specimen: BLOOD  Result Value Ref Range Status   Specimen Description BLOOD BLOOD RIGHT HAND  Final   Special Requests   Final    BOTTLES DRAWN AEROBIC AND ANAEROBIC Blood Culture adequate volume   Culture   Final    NO GROWTH 5 DAYS Performed at [facility], [address] [place], [place] [address]    Report Status 06/28/2024 FINAL  Final  Body fluid culture w Gram Stain     Status: None   Collection Time: 06/24/24  3:55 PM   Specimen: PATH Cytology Pleural fluid  Result Value Ref Range Status   Specimen Description   Final    PLEURAL Performed at [facility], [address] [place], [place] [address]    Special Requests   Final    NONE Performed at [facility], [address] [place], [place] [address]    Gram Stain   Final    WBC PRESENT,BOTH PMN AND MONONUCLEAR NO ORGANISMS SEEN CYTOSPIN SMEAR    Culture   Final    NO GROWTH 3 DAYS Performed at [facility], [address] [place], [place] [address]    Report Status 06/27/2024 FINAL  Final    Coagulation Studies: No results for input(s): LABPROT, INR in the last 72 hours.   Urinalysis: No results for input(s): COLORURINE, LABSPEC, PHURINE, GLUCOSEU, HGBUR, BILIRUBINUR, KETONESUR, PROTEINUR, UROBILINOGEN, NITRITE, LEUKOCYTESUR in the last 72 hours.  Invalid input(s): APPERANCEUR     Imaging: DG Chest 1 View Result Date: 06/28/2024 CLINICAL DATA:  Shortness of breath. EXAM: CHEST  1 VIEW  COMPARISON:  06/26/2024 FINDINGS: Right-sided dialysis catheter unchanged. Partially loculated left pleural effusion, minimally increased. Fluid tracks into the fissure. Small right pleural effusion is unchanged. Stable bibasilar opacities. Stable heart size and mediastinal contours. Postsurgical change in the thoracic spine. IMPRESSION: 1. Partially loculated left pleural effusion, minimally increased. 2. Small right pleural effusion is unchanged. 3. Stable bibasilar opacities. Electronically Signed   By: Andrea Gasman M.D.   On: 06/28/2024 19:26     Medications:      carvedilol   6.25 mg Oral BID WC   Chlorhexidine  Gluconate Cloth  6 each Topical Q0600   diltiazem   240 mg Oral QPM   epoetin  alfa-epbx (RETACRIT ) injection  4,000 Units Intravenous Q M,W,F-1800   famotidine   10 mg  Oral Daily   fluticasone  furoate-vilanterol  1 puff Inhalation Daily   furosemide   80 mg Oral Daily   Gerhardt's butt cream   Topical TID   heparin   5,000 Units Subcutaneous Q8H   insulin  aspart  0-15 Units Subcutaneous TID WC   insulin  aspart  0-5 Units Subcutaneous QHS   insulin  aspart  8 Units Subcutaneous TID WC   insulin  glargine  16 Units Subcutaneous BID   lactulose   20 g Oral BID   levothyroxine   175 mcg Oral Q0600   linaclotide   145 mcg Oral QAC breakfast   melatonin  10 mg Oral QHS   bisacodyl , bisacodyl , ipratropium-albuterol , oxyCODONE , oxyCODONE , polyethylene glycol  Assessment/ Plan:  Mr. Yassen Kinnett is a 63 y.o.  male with past medical conditions including diabetes, bilateral BKA, suprapubic catheter, hepatitis C, and end-stage renal disease on hemodialysis.  Patient presented to the emergency department from nursing facility with altered mental status and respiratory distress.  Patient currently admitted to ICU for Anasarca [R60.1] Severe sepsis with acute organ dysfunction (HCC) [A41.9, R65.20] Acute on chronic respiratory failure with hypoxia (HCC) [J96.21] Altered mental status,  unspecified altered mental status type [R41.82] Pneumonia due to infectious organism, unspecified laterality, unspecified part of lung [J18.9]  [facility]/MWF/right PermCath  End-stage renal disease with hyperkalemia on hemodialysis. Next treatment scheduled for Wednesday.   2.  Acute respiratory failure, patient placed on BiPAP on ED arrival.  Thoracentesis on 10/1 with 0.25L fluid removal.  Weaned to 5L [place]  3. Anemia of chronic kidney disease Lab Results  Component Value Date   HGB 9.5 (L) 06/29/2024    Continue Retacrit  4000 unit with dialysis  4. Diabetes mellitus type II with chronic kidney disease/renal manifestations: insulin  dependent. Home regimen includes glargine and lispro. Most recent hemoglobin A1c is 6.0 on 04/14/2024.   Primary team to continue management.     LOS: 8 Rayne Cowdrey 10/7/20251:28 PM

## 2024-07-17 ENCOUNTER — Telehealth (INDEPENDENT_AMBULATORY_CARE_PROVIDER_SITE_OTHER): Payer: Self-pay

## 2024-07-17 NOTE — Telephone Encounter (Signed)
 Prentice from [facility] called in reference to patients CVC port clotted on Wednesday and today is running at 200. He stated patients MD started him on Plavix yesterday. He was requesting a declot today? Please advise

## 2024-07-17 NOTE — Telephone Encounter (Signed)
 At this time of day, he needs to go to the ED.  Otherwise, we can try to get him in for a cath exchange next week

## 2024-07-20 ENCOUNTER — Telehealth (INDEPENDENT_AMBULATORY_CARE_PROVIDER_SITE_OTHER): Payer: Self-pay

## 2024-07-20 NOTE — Telephone Encounter (Signed)
 Spoke with Sonny at [facility] and the patient has been scheduled for a permcath exchange on 07/21/24 with a 12:30 pm arrival time to the [facility]. Pre-procedure instructions were discussed and will be faxed to attn: Sonny at [facility].

## 2024-07-20 NOTE — Telephone Encounter (Signed)
 I called to get the patient scheduled for a permcath exchange. When I spoke with the person that usually schedules, I was told that someone else does it and she will pass the message on to them.

## 2024-07-21 ENCOUNTER — Ambulatory Visit
Admission: RE | Admit: 2024-07-21 | Discharge: 2024-07-21 | Disposition: A | Discharge status: Skilled Nursing Facility | Attending: Vascular Surgery | Admitting: Vascular Surgery

## 2024-07-21 ENCOUNTER — Encounter
Admission: RE | Disposition: A | Discharge status: Skilled Nursing Facility | Payer: Self-pay | Source: Home / Self Care | Attending: Vascular Surgery

## 2024-07-21 ENCOUNTER — Encounter: Payer: Self-pay | Admitting: Vascular Surgery

## 2024-07-21 ENCOUNTER — Other Ambulatory Visit: Payer: Self-pay

## 2024-07-21 DIAGNOSIS — E1122 Type 2 diabetes mellitus with diabetic chronic kidney disease: Secondary | ICD-10-CM | POA: Insufficient documentation

## 2024-07-21 DIAGNOSIS — N186 End stage renal disease: Secondary | ICD-10-CM | POA: Diagnosis not present

## 2024-07-21 DIAGNOSIS — Z794 Long term (current) use of insulin: Secondary | ICD-10-CM | POA: Insufficient documentation

## 2024-07-21 DIAGNOSIS — Z992 Dependence on renal dialysis: Secondary | ICD-10-CM | POA: Insufficient documentation

## 2024-07-21 DIAGNOSIS — D631 Anemia in chronic kidney disease: Secondary | ICD-10-CM | POA: Insufficient documentation

## 2024-07-21 DIAGNOSIS — Z89512 Acquired absence of left leg below knee: Secondary | ICD-10-CM | POA: Diagnosis not present

## 2024-07-21 DIAGNOSIS — Y839 Surgical procedure, unspecified as the cause of abnormal reaction of the patient, or of later complication, without mention of misadventure at the time of the procedure: Secondary | ICD-10-CM | POA: Insufficient documentation

## 2024-07-21 DIAGNOSIS — T8249XA Other complication of vascular dialysis catheter, initial encounter: Secondary | ICD-10-CM | POA: Diagnosis present

## 2024-07-21 DIAGNOSIS — Z89511 Acquired absence of right leg below knee: Secondary | ICD-10-CM | POA: Diagnosis not present

## 2024-07-21 HISTORY — PX: DIALYSIS/PERMA CATHETER INSERTION: CATH118288

## 2024-07-21 LAB — POTASSIUM (ARMC VASCULAR LAB ONLY): Potassium (ARMC vascular lab): 5.8 mmol/L — ABNORMAL HIGH (ref 3.5–5.1)

## 2024-07-21 LAB — GLUCOSE, CAPILLARY: Glucose-Capillary: 256 mg/dL — ABNORMAL HIGH (ref 70–99)

## 2024-07-21 SURGERY — DIALYSIS/PERMA CATHETER INSERTION
Anesthesia: Moderate Sedation

## 2024-07-21 MED ORDER — METHYLPREDNISOLONE SODIUM SUCC 125 MG IJ SOLR: 125.0000 mg | Freq: Once | INTRAMUSCULAR | Status: DC | PRN

## 2024-07-21 MED ORDER — HEPARIN (PORCINE) IN NACL 1000-0.9 UT/500ML-% IV SOLN
INTRAVENOUS | Status: DC | PRN
  Administered 2024-07-21: 500 mL

## 2024-07-21 MED ORDER — CEFAZOLIN SODIUM-DEXTROSE 1-4 GM/50ML-% IV SOLN
1.0000 g | INTRAVENOUS | Status: AC
  Administered 2024-07-21: 1 g via INTRAVENOUS

## 2024-07-21 MED ORDER — FENTANYL CITRATE (PF) 100 MCG/2ML IJ SOLN
INTRAMUSCULAR | Status: DC | PRN
  Administered 2024-07-21 (×2): 50 ug via INTRAVENOUS

## 2024-07-21 MED ORDER — SODIUM CHLORIDE 0.9 % IV SOLN: INTRAVENOUS | Status: DC

## 2024-07-21 MED ORDER — MIDAZOLAM HCL (PF) 2 MG/2ML IJ SOLN
INTRAMUSCULAR | Status: DC | PRN
  Administered 2024-07-21 (×2): 1 mg via INTRAVENOUS

## 2024-07-21 MED ORDER — DIPHENHYDRAMINE HCL 50 MG/ML IJ SOLN: 50.0000 mg | Freq: Once | INTRAMUSCULAR | Status: DC | PRN

## 2024-07-21 MED ORDER — HYDROMORPHONE HCL 1 MG/ML IJ SOLN: 1.0000 mg | Freq: Once | INTRAMUSCULAR | Status: DC | PRN

## 2024-07-21 MED ORDER — MIDAZOLAM HCL 2 MG/ML PO SYRP: 8.0000 mg | ORAL_SOLUTION | Freq: Once | ORAL | Status: DC | PRN

## 2024-07-21 MED ORDER — FAMOTIDINE 20 MG PO TABS: 40.0000 mg | ORAL_TABLET | Freq: Once | ORAL | Status: DC | PRN

## 2024-07-21 MED ORDER — ONDANSETRON HCL 4 MG/2ML IJ SOLN: 4.0000 mg | Freq: Four times a day (QID) | INTRAMUSCULAR | Status: DC | PRN

## 2024-07-21 MED ORDER — FENTANYL CITRATE (PF) 100 MCG/2ML IJ SOLN
INTRAMUSCULAR | Status: AC
  Filled 2024-07-21: qty 2

## 2024-07-21 MED ORDER — HEPARIN SODIUM (PORCINE) 10000 UNIT/ML IJ SOLN
INTRAMUSCULAR | Status: DC | PRN
Start: 2024-07-21 — End: 2024-07-21
  Administered 2024-07-21: 10000 [IU]

## 2024-07-21 MED ORDER — HEPARIN SODIUM (PORCINE) 1000 UNIT/ML IJ SOLN
INTRAMUSCULAR | Status: AC
  Filled 2024-07-21: qty 10

## 2024-07-21 MED ORDER — MIDAZOLAM HCL 5 MG/5ML IJ SOLN
INTRAMUSCULAR | Status: AC
Start: 2024-07-21 — End: 2024-07-21
  Filled 2024-07-21: qty 5

## 2024-07-21 MED ORDER — CEFAZOLIN SODIUM-DEXTROSE 1-4 GM/50ML-% IV SOLN
INTRAVENOUS | Status: AC
  Filled 2024-07-21: qty 50

## 2024-07-21 MED ORDER — LIDOCAINE-EPINEPHRINE (PF) 1 %-1:200000 IJ SOLN
INTRAMUSCULAR | Status: DC | PRN
  Administered 2024-07-21: 20 mL

## 2024-07-21 SURGICAL SUPPLY — 6 items
BIOPATCH RED 1 DISK 7.0 (GAUZE/BANDAGES/DRESSINGS) IMPLANT
CATH PALINDROME-P 19CM W/VT (CATHETERS) IMPLANT
GUIDEWIRE SUPER STIFF .035X180 (WIRE) IMPLANT
PACK ANGIOGRAPHY (CUSTOM PROCEDURE TRAY) ×1 IMPLANT
SUT MNCRL AB 4-0 PS2 18 (SUTURE) IMPLANT
SUT PROLENE 0 CT 1 30 (SUTURE) IMPLANT

## 2024-07-21 NOTE — Interval H&P Note (Signed)
 History and Physical Interval Note:  07/21/2024 12:21 PM  Eugene [name]  has presented today for surgery, with the diagnosis of Perma Cath Exchange   End Stage Renal   [facility].  The various methods of treatment have been discussed with the patient and family. After consideration of risks, benefits and other options for treatment, the patient has consented to  Procedure(s): DIALYSIS/PERMA CATHETER INSERTION (N/A) as a surgical intervention.  The patient's history has been reviewed, patient examined, no change in status, stable for surgery.  I have reviewed the patient's chart and labs.  Questions were answered to the patient's satisfaction.     Fabiano Ginley

## 2024-07-21 NOTE — Op Note (Signed)
 OPERATIVE NOTE    PRE-OPERATIVE DIAGNOSIS: 1. ESRD 2. Non-functional permcath  POST-OPERATIVE DIAGNOSIS: same as above  PROCEDURE: Fluoroscopic guidance for placement of catheter Placement of a 19 cm tip to cuff tunneled hemodialysis catheter via the right internal jugular vein and removal of previous catheter  SURGEON: Selinda Gu, MD  ANESTHESIA:  Local with moderate conscious sedation for 22 minutes using 2 mg of Versed  and 100 mcg of Fentanyl   ESTIMATED BLOOD LOSS: 3 cc  FINDING(S): none  SPECIMEN(S):  None  INDICATIONS:   Patient is a 63 y.o.male who presents with non-functional dialysis catheter and ESRD.  The patient needs long term dialysis access for their ESRD, and a Permcath is necessary.  Risks and benefits are discussed and informed consent is obtained.    DESCRIPTION: After obtaining full informed written consent, the patient was brought back to the vascular suite. The patient received moderate conscious sedation during a face-to-face encounter with me present throughout the entire procedure and supervising the RN monitoring the vital signs, pulse oximetry, telemetry, and mental status throughout the entire procedure. The patient's existing catheter, right neck and chest were sterilely prepped and draped in a sterile surgical field was created.  The existing catheter was dissected free from the fibrous sheath securing the cuff with hemostats and blunt dissection.  A wire was placed. The existing catheter was then removed and the wire used to keep venous access. I selected a 19 cm tip to cuff tunneled dialysis catheter.  Using fluoroscopic guidance the catheter tips were parked in the right atrium. The appropriate distal connectors were placed. It withdrew blood well and flushed easily with heparinized saline and a concentrated heparin  solution was then placed. It was secured to the chest wall with 2 Prolene sutures. A 4-0 Monocryl pursestring suture was placed around the exit  site. Sterile dressings were placed. The patient tolerated the procedure well and was taken to the recovery room in stable condition.  COMPLICATIONS: None  CONDITION: Stable  Selinda Gu 07/21/2024 2:07 PM   This note was created with Dragon Medical transcription system. Any errors in dictation are purely unintentional.

## 2024-07-21 NOTE — Progress Notes (Addendum)
 Attempted x2 to call report to [employer] and left HIPAA compliant voicemail with callback number x1 PT alert and oriented, discussed discharge instructions with pt who verbalized understanding. AVS sent with pt

## 2024-08-03 ENCOUNTER — Emergency Department

## 2024-08-03 ENCOUNTER — Inpatient Hospital Stay
Admission: EM | Admit: 2024-08-03 | Discharge: 2024-08-05 | DRG: 291 | Disposition: A | Discharge status: Skilled Nursing Facility | Source: Skilled Nursing Facility | Attending: Internal Medicine | Admitting: Internal Medicine

## 2024-08-03 ENCOUNTER — Other Ambulatory Visit: Payer: Self-pay

## 2024-08-03 DIAGNOSIS — Z89512 Acquired absence of left leg below knee: Secondary | ICD-10-CM

## 2024-08-03 DIAGNOSIS — I10 Essential (primary) hypertension: Secondary | ICD-10-CM | POA: Diagnosis present

## 2024-08-03 DIAGNOSIS — Z7984 Long term (current) use of oral hypoglycemic drugs: Secondary | ICD-10-CM | POA: Diagnosis not present

## 2024-08-03 DIAGNOSIS — Z794 Long term (current) use of insulin: Secondary | ICD-10-CM

## 2024-08-03 DIAGNOSIS — N186 End stage renal disease: Secondary | ICD-10-CM | POA: Diagnosis present

## 2024-08-03 DIAGNOSIS — Z981 Arthrodesis status: Secondary | ICD-10-CM | POA: Diagnosis not present

## 2024-08-03 DIAGNOSIS — E785 Hyperlipidemia, unspecified: Secondary | ICD-10-CM | POA: Diagnosis present

## 2024-08-03 DIAGNOSIS — E1122 Type 2 diabetes mellitus with diabetic chronic kidney disease: Secondary | ICD-10-CM | POA: Diagnosis present

## 2024-08-03 DIAGNOSIS — F17210 Nicotine dependence, cigarettes, uncomplicated: Secondary | ICD-10-CM | POA: Diagnosis present

## 2024-08-03 DIAGNOSIS — Z79899 Other long term (current) drug therapy: Secondary | ICD-10-CM | POA: Diagnosis not present

## 2024-08-03 DIAGNOSIS — R0602 Shortness of breath: Secondary | ICD-10-CM | POA: Diagnosis present

## 2024-08-03 DIAGNOSIS — J9611 Chronic respiratory failure with hypoxia: Secondary | ICD-10-CM | POA: Diagnosis present

## 2024-08-03 DIAGNOSIS — Z6838 Body mass index (BMI) 38.0-38.9, adult: Secondary | ICD-10-CM

## 2024-08-03 DIAGNOSIS — B1920 Unspecified viral hepatitis C without hepatic coma: Secondary | ICD-10-CM | POA: Diagnosis present

## 2024-08-03 DIAGNOSIS — D631 Anemia in chronic kidney disease: Secondary | ICD-10-CM | POA: Diagnosis present

## 2024-08-03 DIAGNOSIS — E1165 Type 2 diabetes mellitus with hyperglycemia: Secondary | ICD-10-CM | POA: Diagnosis present

## 2024-08-03 DIAGNOSIS — Z8419 Family history of other disorders of kidney and ureter: Secondary | ICD-10-CM

## 2024-08-03 DIAGNOSIS — E039 Hypothyroidism, unspecified: Secondary | ICD-10-CM | POA: Diagnosis present

## 2024-08-03 DIAGNOSIS — I132 Hypertensive heart and chronic kidney disease with heart failure and with stage 5 chronic kidney disease, or end stage renal disease: Secondary | ICD-10-CM | POA: Diagnosis present

## 2024-08-03 DIAGNOSIS — Z89511 Acquired absence of right leg below knee: Secondary | ICD-10-CM | POA: Diagnosis not present

## 2024-08-03 DIAGNOSIS — I5032 Chronic diastolic (congestive) heart failure: Secondary | ICD-10-CM | POA: Diagnosis present

## 2024-08-03 DIAGNOSIS — Z79890 Hormone replacement therapy (postmenopausal): Secondary | ICD-10-CM

## 2024-08-03 DIAGNOSIS — Z992 Dependence on renal dialysis: Secondary | ICD-10-CM | POA: Diagnosis not present

## 2024-08-03 DIAGNOSIS — E66812 Obesity, class 2: Secondary | ICD-10-CM | POA: Diagnosis present

## 2024-08-03 DIAGNOSIS — G894 Chronic pain syndrome: Secondary | ICD-10-CM | POA: Diagnosis present

## 2024-08-03 LAB — CBC WITH DIFFERENTIAL/PLATELET
Abs Immature Granulocytes: 0.04 K/uL (ref 0.00–0.07)
Basophils Absolute: 0 K/uL (ref 0.0–0.1)
Basophils Relative: 0 %
Eosinophils Absolute: 0.2 K/uL (ref 0.0–0.5)
Eosinophils Relative: 2 %
HCT: 34.5 % — ABNORMAL LOW (ref 39.0–52.0)
Hemoglobin: 10.9 g/dL — ABNORMAL LOW (ref 13.0–17.0)
Immature Granulocytes: 1 %
Lymphocytes Relative: 16 %
Lymphs Abs: 1.2 K/uL (ref 0.7–4.0)
MCH: 29.3 pg (ref 26.0–34.0)
MCHC: 31.6 g/dL (ref 30.0–36.0)
MCV: 92.7 fL (ref 80.0–100.0)
Monocytes Absolute: 0.7 K/uL (ref 0.1–1.0)
Monocytes Relative: 9 %
Neutro Abs: 5.3 K/uL (ref 1.7–7.7)
Neutrophils Relative %: 72 %
Platelets: 183 K/uL (ref 150–400)
RBC: 3.72 MIL/uL — ABNORMAL LOW (ref 4.22–5.81)
RDW: 14 % (ref 11.5–15.5)
WBC: 7.4 K/uL (ref 4.0–10.5)
nRBC: 0 % (ref 0.0–0.2)

## 2024-08-03 LAB — BASIC METABOLIC PANEL WITH GFR
Anion gap: 11 (ref 5–15)
BUN: 50 mg/dL — ABNORMAL HIGH (ref 8–23)
CO2: 23 mmol/L (ref 22–32)
Calcium: 7.8 mg/dL — ABNORMAL LOW (ref 8.9–10.3)
Chloride: 104 mmol/L (ref 98–111)
Creatinine, Ser: 4.14 mg/dL — ABNORMAL HIGH (ref 0.61–1.24)
GFR, Estimated: 16 mL/min — ABNORMAL LOW (ref >60)
Glucose, Bld: 241 mg/dL — ABNORMAL HIGH (ref 70–99)
Potassium: 4.9 mmol/L (ref 3.5–5.1)
Sodium: 138 mmol/L (ref 135–145)

## 2024-08-03 LAB — TROPONIN I (HIGH SENSITIVITY)
Troponin I (High Sensitivity): 11 ng/L (ref <18)
Troponin I (High Sensitivity): 10 ng/L (ref <18)

## 2024-08-03 LAB — MAGNESIUM: Magnesium: 2.1 mg/dL (ref 1.7–2.4)

## 2024-08-03 MED ORDER — HEPARIN SODIUM (PORCINE) 5000 UNIT/ML IJ SOLN
5000.0000 [IU] | Freq: Three times a day (TID) | INTRAMUSCULAR | Status: DC
  Administered 2024-08-04 – 2024-08-05 (×3): 5000 [IU] via SUBCUTANEOUS
  Filled 2024-08-03 (×3): qty 1

## 2024-08-03 MED ORDER — ACETAMINOPHEN 650 MG RE SUPP: 650.0000 mg | Freq: Four times a day (QID) | RECTAL | Status: DC | PRN

## 2024-08-03 MED ORDER — SENNOSIDES-DOCUSATE SODIUM 8.6-50 MG PO TABS: 1.0000 | ORAL_TABLET | Freq: Every evening | ORAL | Status: DC | PRN

## 2024-08-03 MED ORDER — SODIUM CHLORIDE 0.9% FLUSH
3.0000 mL | Freq: Two times a day (BID) | INTRAVENOUS | Status: DC
  Administered 2024-08-04 (×3): 3 mL via INTRAVENOUS

## 2024-08-03 MED ORDER — ACETAMINOPHEN 325 MG PO TABS
650.0000 mg | ORAL_TABLET | Freq: Four times a day (QID) | ORAL | Status: DC | PRN
Start: 2024-08-03 — End: 2024-08-05

## 2024-08-03 NOTE — ED Triage Notes (Signed)
 Pt to ED via [facility] from [facility]. Pt had not received treatment for one week, received 30 minutes today when he became short of breath. Dialysis staff stated BP dropped to 78/40, gave pt 500 NS. Pt O2 80% on room air on EMS arrival, placed on 6L nasal cannula.  Pt from [facility]  BP 145/76 HR 78 91% 6L RR 20 Cbg 243

## 2024-08-03 NOTE — H&P (Signed)
 History and Physical    Eugene [name] FMW:[id number] DOB: 1961/09/24 DOA: 08/03/2024  DOS: the patient was seen and examined on 08/03/2024  PCP: [facility]   Patient coming from: HD Center  I have personally briefly reviewed patient's old medical records in [facility] and CareEverywhere  HPI:   Eugene [name] is a 63 y.o. year old male with medical history of HTN, HLD, T2DM, ESRD on HD presenting to ED with concern of shortness of breath during HD.    Pt states he has been going without his hemodialysis for around 9 days.  He was in dialysis session and was getting dialysis that he developed shortness of breath and became hypotensive.  They stopped his dialysis and called EMS.  Per patient he was wearing his oxygen medication and short of breath but per EMS he was on room air.  He denies any symptoms of infection and feels well currently.  He states he sleeps dialysis but they always have trouble doing it at the center and this is why he has been missing his dialysis.   On arrival to the ED patient was noted to be HDS stable. Lab work and imaging was obtained. CBC and BMP obtained.  CBC with mild anemia and BMP with chronic ESRD changes and no significant electrolyte abnormalities.  EKG without any acute changes and troponin negative x 2.  Chest x-ray with chronic pleural effusions bilaterally no acute findings.   Review of Systems: As mentioned in the history of present illness. All other systems reviewed and are negative.   Past Medical History:  Diagnosis Date   Anxiety    Depression    Diabetes mellitus without complication (HCC)    Hepatitis C    Hyperlipidemia    IBS (irritable bowel syndrome)    Osteomyelitis (HCC)    Spinal stenosis     Past Surgical History:  Procedure Laterality Date   BACK SURGERY     bilateral amputation Bilateral    CENTRAL LINE INSERTION-TUNNELED N/A 02/02/2019   Procedure: CENTRAL LINE INSERTION-TUNNELED;  Surgeon: Marea Selinda RAMAN, MD;  Location: [facility] INVASIVE CV LAB;  Service: Cardiovascular;  Laterality: N/A;   COLONOSCOPY WITH PROPOFOL  N/A 01/12/2020   Procedure: COLONOSCOPY WITH PROPOFOL ;  Surgeon: Jinny Carmine, MD;  Location: [facility] ENDOSCOPY;  Service: Endoscopy;  Laterality: N/A;   DIALYSIS/PERMA CATHETER INSERTION N/A 05/22/2024   Procedure: DIALYSIS/PERMA CATHETER INSERTION;  Surgeon: Jama Cordella MATSU, MD;  Location: [facility] INVASIVE CV LAB;  Service: Cardiovascular;  Laterality: N/A;   DIALYSIS/PERMA CATHETER INSERTION N/A 07/21/2024   Procedure: DIALYSIS/PERMA CATHETER INSERTION;  Surgeon: Marea Selinda RAMAN, MD;  Location: [facility] INVASIVE CV LAB;  Service: Cardiovascular;  Laterality: N/A;   ESOPHAGOGASTRODUODENOSCOPY (EGD) WITH PROPOFOL  N/A 12/07/2019   Procedure: ESOPHAGOGASTRODUODENOSCOPY (EGD) WITH PROPOFOL ;  Surgeon: Unk Corinn Skiff, MD;  Location: [facility] ENDOSCOPY;  Service: Gastroenterology;  Laterality: N/A;   IR CATHETER TUBE CHANGE  12/04/2019   SPINAL FUSION     THORACIC SPINE SURGERY  01/2019   extensive washout     No Known Allergies  Family History  Problem Relation Age of Onset   Kidney failure Father    Kidney failure Brother     Prior to Admission medications   Medication Sig Start Date End Date Taking? Authorizing Provider  acetaminophen  (TYLENOL ) 325 MG tablet Take 650 mg by mouth in the morning and at bedtime.    [provider]  bisacodyl  (DULCOLAX) 10 MG suppository Place 10 mg rectally daily as needed  for moderate constipation.    [provider]  bisacodyl  (DULCOLAX) 5 MG EC tablet Take 2 tablets (10 mg total) by mouth at bedtime. 05/16/24   Fausto Burnard LABOR, DO  carvedilol  (COREG ) 6.25 MG tablet Take 1 tablet (6.25 mg total) by mouth 2 (two) times daily with a meal. Patient taking differently: Take 6.25 mg by mouth 2 (two) times daily with a meal. Every Tuesday, Thursday, Saturday and sunday 05/28/24   Caleen Qualia, MD  cyanocobalamin  1000 MCG tablet Take 1  tablet (1,000 mcg total) by mouth daily. 05/17/24   Fausto Burnard LABOR, DO  diltiazem  (CARDIZEM  CD) 240 MG 24 hr capsule Take 1 capsule (240 mg total) by mouth every evening. 05/28/24   Amin, Sumayya, MD  Doxepin  HCl 6 MG TABS Take 6 mg by mouth at bedtime.    [provider]  famotidine  (PEPCID ) 10 MG tablet Take 1 tablet (10 mg total) by mouth daily. 05/17/24   Fausto Burnard LABOR, DO  folic acid  (FOLVITE ) 1 MG tablet Take 1 tablet (1 mg total) by mouth daily. 05/17/24   Fausto Burnard LABOR, DO  furosemide  (LASIX ) 80 MG tablet Take 1 tablet (80 mg total) by mouth daily. Patient taking differently: Take 80 mg by mouth at bedtime. 05/28/24   Caleen Qualia, MD  gabapentin  (NEURONTIN ) 300 MG capsule Take 300 mg by mouth at bedtime.    [provider]  guaifenesin (ROBITUSSIN) 100 MG/5ML syrup Take 200 mg by mouth 3 (three) times daily as needed for cough.    [provider]  insulin  glargine-yfgn (SEMGLEE ) 100 UNIT/ML injection Inject 0.1 mLs (10 Units total) into the skin 2 (two) times daily. Patient taking differently: Inject 12-14 Units into the skin 2 (two) times daily. 05/01/24   Jhonny Calvin NOVAK, MD  insulin  lispro (HUMALOG ) 100 UNIT/ML injection Inject 0-15 Units into the skin 4 (four) times daily -  before meals and at bedtime. <150= 0 units 151-200= 3 units 201-250= 6 units 251-300= 9 units 301-350= 12 units 351-400= 15 units >400 call MD Patient taking differently: Inject 0-15 Units into the skin 4 (four) times daily -  before meals and at bedtime. <150= 0 units 151-200= 3 units 201-250= 6 units 251-300= 9 units 301-350= 12 units 351-400= 15 units >400 call MD    [provider]  ipratropium-albuterol  (DUONEB) 0.5-2.5 (3) MG/3ML SOLN Take 3 mLs by nebulization every 6 (six) hours as needed.    [provider]  Lactulose  20 GM/30ML SOLN Take 30 mLs by mouth 2 (two) times daily.    [provider]  levothyroxine  (SYNTHROID ) 175 MCG tablet  Take 175 mcg by mouth at bedtime.    [provider]  linaclotide  (LINZESS ) 145 MCG CAPS capsule Take 1 capsule (145 mcg total) by mouth daily before breakfast. 10/25/19   Danford, Lonni SQUIBB, MD  Melatonin 5 MG TABS Take 10 mg by mouth at bedtime.    [provider]  metoCLOPramide  (REGLAN ) 10 MG tablet Take 0.5 tablets (5 mg total) by mouth every 8 (eight) hours as needed for nausea or vomiting. 05/28/24   Caleen Qualia, MD  midodrine  (PROAMATINE ) 5 MG tablet Take 5 mg by mouth 3 (three) times daily with meals as needed.    [provider]  ondansetron  (ZOFRAN ) 4 MG tablet Take 4 mg by mouth every 8 (eight) hours as needed for nausea or vomiting.    [provider]  Oxycodone  HCl 10 MG TABS Take 0.5 tablets (5 mg total)  by mouth every 6 (six) hours as needed. 06/30/24   Jerelene Critchley, MD  polyethylene glycol (MIRALAX  / GLYCOLAX ) 17 g packet Take 17 g by mouth 2 (two) times daily. 10/24/19   Danford, Lonni SQUIBB, MD  senna-docusate (SENOKOT-S) 8.6-50 MG tablet Take 1 tablet by mouth 2 (two) times daily. 10/24/19   Danford, Lonni SQUIBB, MD  simethicone  (MYLICON) 80 MG chewable tablet Chew 1 tablet (80 mg total) by mouth 4 (four) times daily. 05/01/24   Jhonny Calvin NOVAK, MD  sitaGLIPtin (JANUVIA) 25 MG tablet Take 25 mg by mouth daily.    [provider]  Vitamin D , Ergocalciferol , (DRISDOL ) 1.25 MG (50000 UNIT) CAPS capsule Take 1 capsule (50,000 Units total) by mouth every 7 (seven) days. 05/18/24   Fausto Sor A, DO  pregabalin  (LYRICA ) 75 MG capsule Take 75 mg by mouth 2 (two) times daily. Patient not taking: Reported on 04/17/2021  04/20/21  [provider]    Social History:  reports that he has been smoking cigarettes. He has never used smokeless tobacco. He reports that he does not currently use alcohol. He reports that he does not currently use drugs. Lives at assisted living facility Tobacco-states he is currently not smoking but  does have past tobacco use EtOH- Denies use.  Illicit drug use- denies use.  IADLs/ADLs-needs assistance with ADLs and IADLs   Physical Exam: Vitals:   08/03/24 1600 08/03/24 1630 08/03/24 1645 08/03/24 1705  BP: (!) 181/111 (!) 183/96 (!) 168/110 (!) 168/110  Pulse: 96 75 100 100  Resp:    20  Temp:    98.3 F (36.8 C)  TempSrc:    Oral  SpO2: 97% 96% 96% 96%   Physical exam Gen: chronically ill appearing male NCAT: [place] in place CV: RRR, good radial pulse Pulm: decreased breath sounds at bases Abd: no TTP normal bowel sounds MSK: bilateral AKA Skin: no lesions noted Neuro: alert and oriented x4   Labs on Admission: I have personally reviewed following labs and imaging studies  CBC: Recent Labs  Lab 08/03/24 1359  WBC 7.4  NEUTROABS 5.3  HGB 10.9*  HCT 34.5*  MCV 92.7  PLT 183   Basic Metabolic Panel: Recent Labs  Lab 08/03/24 1359  NA 138  K 4.9  CL 104  CO2 23  GLUCOSE 241*  BUN 50*  CREATININE 4.14*  CALCIUM  7.8*   GFR: Estimated Creatinine Clearance: 24.1 mL/min (A) (by C-G formula based on SCr of 4.14 mg/dL (H)). Liver Function Tests: No results for input(s): AST, ALT, ALKPHOS, BILITOT, PROT, ALBUMIN  in the last 168 hours. No results for input(s): LIPASE, AMYLASE in the last 168 hours. No results for input(s): AMMONIA in the last 168 hours. Coagulation Profile: No results for input(s): INR, PROTIME in the last 168 hours. Cardiac Enzymes: Recent Labs  Lab 08/03/24 1359 08/03/24 1752  TROPONINIHS 11 10   BNP (last 3 results) Recent Labs    05/11/24 2240 05/21/24 2323 06/22/24 1553  BNP 188.4* 456.6* 669.6*   HbA1C: No results for input(s): HGBA1C in the last 72 hours. CBG: No results for input(s): GLUCAP in the last 168 hours. Lipid Profile: No results for input(s): CHOL, HDL, LDLCALC, TRIG, CHOLHDL, LDLDIRECT in the last 72 hours. Thyroid Function Tests: No results for input(s): TSH,  T4TOTAL, FREET4, T3FREE, THYROIDAB in the last 72 hours. Anemia Panel: No results for input(s): VITAMINB12, FOLATE, FERRITIN, TIBC, IRON , RETICCTPCT in the last 72 hours. Urine analysis:    Component Value Date/Time  COLORURINE YELLOW (A) 06/22/2024 1600   APPEARANCEUR TURBID (A) 06/22/2024 1600   LABSPEC 1.027 06/22/2024 1600   PHURINE 6.0 06/22/2024 1600   GLUCOSEU 50 (A) 06/22/2024 1600   HGBUR NEGATIVE 06/22/2024 1600   BILIRUBINUR NEGATIVE 06/22/2024 1600   KETONESUR NEGATIVE 06/22/2024 1600   PROTEINUR 100 (A) 06/22/2024 1600   NITRITE NEGATIVE 06/22/2024 1600   LEUKOCYTESUR MODERATE (A) 06/22/2024 1600    Radiological Exams on Admission: I have personally reviewed images DG Chest Portable 1 View Result Date: 08/03/2024 CLINICAL DATA:  Shortness of breath. EXAM: PORTABLE CHEST 1 VIEW COMPARISON:  Chest radiograph dated 06/28/2024. FINDINGS: Right-sided dialysis catheter with tip at the cavoatrial junction. Bilateral pleural effusions for left greater and right and similar to prior radiograph. No pneumothorax. Stable cardiac silhouette no acute osseous pathology. Spinal fusion hardware. IMPRESSION: Bilateral pleural effusions, left greater than right. Overall no significant interval change since the prior radiograph. Electronically Signed   By: Vanetta Chou M.D.   On: 08/03/2024 13:42    EKG: My personal interpretation of EKG shows: normal sinus rhythm without any acute ST changes.     Assessment/Plan Principal Problem:   ESRD needing dialysis (HCC) Active Problems:   Type 2 diabetes mellitus with hyperglycemia, with long-term current use of insulin  (HCC)   HTN (hypertension)   S/P bilateral BKA (below knee amputation) (HCC)   Patient with ESRD on HD but unable to get dialysis on outpatient basis given poor access.  He is admitted so he can undergo HD inpatient and fax any access history.  Currently he does have some signs of volume overload but not  overt and no electrolyte derangements requiring immediate dialysis.  EDP consulted with nephrology and they plan for dialysis tomorrow.  If they have access with issue then they will address that at that time.  Bilateral pleural effusions: If refractory to medical therapy may need thoracentesis as he does have supplemental oxygen requirement that is relatively new which I suspect is secondary to this.  Chronic problems: Hypertension: Continue home medicines Hyperlipidemia: Continue home medicines Hypothyroidism: Continue home Synthroid  Type 2 diabetes: Place patient on SSI and titrate as needed. CHF: Continue home diuretics.    VTE prophylaxis:  SQ Heparin   Diet: HH Code Status:  Full Code Telemetry:  Admission status: Inpatient, Telemetry bed Patient is from: Home Anticipated d/c is to: Home Anticipated d/c is in: 2-3 days   Family Communication: Updated at bedside  Consults called: None   Severity of Illness: The appropriate patient status for this patient is INPATIENT. Inpatient status is judged to be reasonable and necessary in order to provide the required intensity of service to ensure the patient's safety. The patient's presenting symptoms, physical exam findings, and initial radiographic and laboratory data in the context of their chronic comorbidities is felt to place them at high risk for further clinical deterioration. Furthermore, it is not anticipated that the patient will be medically stable for discharge from the hospital within 2 midnights of admission.   * I certify that at the point of admission it is my clinical judgment that the patient will require inpatient hospital care spanning beyond 2 midnights from the point of admission due to high intensity of service, high risk for further deterioration and high frequency of surveillance required.DEWAINE Morene Bathe, MD [facility]

## 2024-08-03 NOTE — ED Provider Notes (Signed)
-----------------------------------------   5:44 PM on 08/03/2024 ----------------------------------------- EKG viewed and interpreted by myself shows a normal sinus rhythm at 88 bpm with a narrow QRS, normal axis, normal intervals, no concerning ST changes.  I have also ordered the initial troponin as well as a 2-hour repeat to help rule out ACS.  Hospitalist will be admitting to their service.  Patient's initial troponin is 11.   Dorothyann Drivers, MD 08/03/24 1836

## 2024-08-03 NOTE — ED Notes (Signed)
 Lab called again for 2nd trop  md aware.

## 2024-08-03 NOTE — ED Notes (Signed)
 Lab called for repeat trop   md aware

## 2024-08-03 NOTE — ED Notes (Signed)
 Lab called to collect blood work due to pt being difficult stick

## 2024-08-03 NOTE — ED Provider Notes (Signed)
 [facility] Provider Note    Event Date/Time   First MD Initiated Contact with Patient 08/03/24 1256     (approximate)   History   Shortness of Breath   HPI  Eugene [name] is a 63 y.o. male who presents to the emergency department today because concerns for shortness of breath.  Patient is coming from dialysis.  The patient states that they have been having a hard time accessing him for dialysis.  Because of this he says he has not had dialysis in the past 2 sessions.  Apparently today they had performed roughly 30 minutes of his session when he started complaining of worsening shortness of breath.  When EMS got there the patient was not on oxygen and was hypoxic on room air.  He does state that he is normally on oxygen.  At the time my exam the patient is feeling better. He does still make urine.     Physical Exam   Triage Vital Signs: ED Triage Vitals  Encounter Vitals Group     BP 08/03/24 1237 (!) 153/80     Girls Systolic BP Percentile --      Girls Diastolic BP Percentile --      Boys Systolic BP Percentile --      Boys Diastolic BP Percentile --      Pulse Rate 08/03/24 1237 77     Resp 08/03/24 1237 20     Temp 08/03/24 1237 98.4 F (36.9 C)     Temp Source 08/03/24 1237 Oral     SpO2 08/03/24 1237 95 %     Weight --      Height --      Head Circumference --      Peak Flow --      Pain Score 08/03/24 1233 0     Pain Loc --      Pain Education --      Exclude from Growth Chart --     Most recent vital signs: Vitals:   08/03/24 1237  BP: (!) 153/80  Pulse: 77  Resp: 20  Temp: 98.4 F (36.9 C)  SpO2: 95%   General: Awake, alert, oriented. CV:  Good peripheral perfusion. Regular rate and rhythm. Resp:  Normal effort. Poor air movement. Abd:  No distention.    ED Results / Procedures / Treatments   Labs (all labs ordered are listed, but only abnormal results are displayed) Labs Reviewed  CBC WITH DIFFERENTIAL/PLATELET -  Abnormal; Notable for the following components:      Result Value   RBC 3.72 (*)    Hemoglobin 10.9 (*)    HCT 34.5 (*)    All other components within normal limits  BASIC METABOLIC PANEL WITH GFR - Abnormal; Notable for the following components:   Glucose, Bld 241 (*)    BUN 50 (*)    Creatinine, Ser 4.14 (*)    Calcium  7.8 (*)    GFR, Estimated 16 (*)    All other components within normal limits  MAGNESIUM   CBC  RENAL FUNCTION PANEL  TROPONIN I (HIGH SENSITIVITY)  TROPONIN I (HIGH SENSITIVITY)      RADIOLOGY I independently interpreted and visualized the CXR. My interpretation: Left sided pleural effusion Radiology interpretation:  IMPRESSION:  Bilateral pleural effusions, left greater than right. Overall no  significant interval change since the prior radiograph.      PROCEDURES:  Critical Care performed: No    MEDICATIONS ORDERED IN ED: Medications - No  data to display   IMPRESSION / MDM / ASSESSMENT AND PLAN / ED COURSE  I reviewed the triage vital signs and the nursing notes.                              Differential diagnosis includes, but is not limited to, fluid overload, pneumonia, URI  Patient's presentation is most consistent with acute presentation with potential threat to life or bodily function.   Patient presented to the emergency department today because concerns for shortness of breath.  Patient completed from dialysis center.  On exam patient without any significant increased work of breathing on home oxygen.  He states he does feel better.  Chest x-ray concerning for bilateral pleural effusions.  Blood work without any hyperkalemia.  I did discuss with Dr. Marcelino with nephrology.  At this time we will plan on admission for dialysis tomorrow.   FINAL CLINICAL IMPRESSION(S) / ED DIAGNOSES   Final diagnoses:  SOB (shortness of breath)       Note:  This document was prepared using Dragon voice recognition software and may include  unintentional dictation errors.    Floy Roberts, MD 08/04/24 [phone]

## 2024-08-04 DIAGNOSIS — N186 End stage renal disease: Secondary | ICD-10-CM | POA: Diagnosis not present

## 2024-08-04 DIAGNOSIS — Z992 Dependence on renal dialysis: Secondary | ICD-10-CM | POA: Diagnosis not present

## 2024-08-04 LAB — CBG MONITORING, ED
Glucose-Capillary: 151 mg/dL — ABNORMAL HIGH (ref 70–99)
Glucose-Capillary: 176 mg/dL — ABNORMAL HIGH (ref 70–99)

## 2024-08-04 LAB — GLUCOSE, CAPILLARY: Glucose-Capillary: 200 mg/dL — ABNORMAL HIGH (ref 70–99)

## 2024-08-04 LAB — HEPATITIS B SURFACE ANTIGEN: Hepatitis B Surface Ag: NONREACTIVE

## 2024-08-04 MED ORDER — VITAMIN D (ERGOCALCIFEROL) 1.25 MG (50000 UNIT) PO CAPS
50000.0000 [IU] | ORAL_CAPSULE | ORAL | Status: DC
  Administered 2024-08-05: 50000 [IU] via ORAL
  Filled 2024-08-04: qty 1

## 2024-08-04 MED ORDER — LORATADINE 10 MG PO TABS
10.0000 mg | ORAL_TABLET | Freq: Every day | ORAL | Status: DC
  Administered 2024-08-04: 10 mg via ORAL
  Filled 2024-08-04: qty 1

## 2024-08-04 MED ORDER — ONDANSETRON HCL 4 MG/2ML IJ SOLN: 4.0000 mg | Freq: Four times a day (QID) | INTRAMUSCULAR | Status: DC | PRN

## 2024-08-04 MED ORDER — DOXEPIN HCL 10 MG PO CAPS
10.0000 mg | ORAL_CAPSULE | Freq: Every day | ORAL | Status: DC
Start: 2024-08-05 — End: 2024-08-05
  Administered 2024-08-04: 10 mg via ORAL
  Filled 2024-08-04 (×2): qty 1

## 2024-08-04 MED ORDER — LINACLOTIDE 145 MCG PO CAPS
145.0000 ug | ORAL_CAPSULE | Freq: Every day | ORAL | Status: DC
  Administered 2024-08-04: 145 ug via ORAL
  Filled 2024-08-04 (×2): qty 1

## 2024-08-04 MED ORDER — OXYCODONE HCL 5 MG PO TABS
ORAL_TABLET | ORAL | Status: AC
  Filled 2024-08-04: qty 2

## 2024-08-04 MED ORDER — CHLORHEXIDINE GLUCONATE CLOTH 2 % EX PADS
6.0000 | MEDICATED_PAD | Freq: Every day | CUTANEOUS | Status: DC
  Administered 2024-08-05: 6 via TOPICAL

## 2024-08-04 MED ORDER — FUROSEMIDE 40 MG PO TABS
80.0000 mg | ORAL_TABLET | Freq: Every day | ORAL | Status: DC
  Administered 2024-08-04 – 2024-08-05 (×2): 80 mg via ORAL
  Filled 2024-08-04 (×2): qty 2

## 2024-08-04 MED ORDER — FAMOTIDINE 20 MG PO TABS
10.0000 mg | ORAL_TABLET | Freq: Every day | ORAL | Status: DC
  Administered 2024-08-04 – 2024-08-05 (×2): 10 mg via ORAL
  Filled 2024-08-04 (×2): qty 1

## 2024-08-04 MED ORDER — MELATONIN 5 MG PO TABS: 5.0000 mg | ORAL_TABLET | Freq: Every evening | ORAL | Status: DC | PRN

## 2024-08-04 MED ORDER — OXYCODONE HCL 5 MG PO TABS
10.0000 mg | ORAL_TABLET | Freq: Four times a day (QID) | ORAL | Status: DC | PRN
  Administered 2024-08-04 – 2024-08-05 (×4): 10 mg via ORAL
  Filled 2024-08-04 (×2): qty 2

## 2024-08-04 MED ORDER — HEPARIN SODIUM (PORCINE) 1000 UNIT/ML IJ SOLN
INTRAMUSCULAR | Status: AC
  Filled 2024-08-04: qty 4

## 2024-08-04 MED ORDER — DILTIAZEM HCL ER COATED BEADS 120 MG PO CP24
240.0000 mg | ORAL_CAPSULE | Freq: Every evening | ORAL | Status: DC
  Administered 2024-08-04 – 2024-08-05 (×2): 240 mg via ORAL
  Filled 2024-08-04: qty 2
  Filled 2024-08-04: qty 1
  Filled 2024-08-04: qty 2

## 2024-08-04 MED ORDER — CARVEDILOL 6.25 MG PO TABS
6.2500 mg | ORAL_TABLET | Freq: Two times a day (BID) | ORAL | Status: DC
  Administered 2024-08-04 – 2024-08-05 (×3): 6.25 mg via ORAL
  Filled 2024-08-04 (×3): qty 1

## 2024-08-04 MED ORDER — INSULIN ASPART 100 UNIT/ML IJ SOLN
0.0000 [IU] | Freq: Three times a day (TID) | INTRAMUSCULAR | Status: DC
  Administered 2024-08-04 (×2): 2 [IU] via SUBCUTANEOUS
  Administered 2024-08-05: 5 [IU] via SUBCUTANEOUS
  Filled 2024-08-04 (×2): qty 2
  Filled 2024-08-04: qty 5

## 2024-08-04 MED ORDER — BISACODYL 5 MG PO TBEC
10.0000 mg | DELAYED_RELEASE_TABLET | Freq: Every day | ORAL | Status: DC
  Administered 2024-08-04: 10 mg via ORAL
  Filled 2024-08-04: qty 2

## 2024-08-04 MED ORDER — LEVOTHYROXINE SODIUM 50 MCG PO TABS
175.0000 ug | ORAL_TABLET | Freq: Every day | ORAL | Status: DC
  Administered 2024-08-04: 175 ug via ORAL
  Filled 2024-08-04: qty 2
  Filled 2024-08-04: qty 1

## 2024-08-04 MED ORDER — GABAPENTIN 300 MG PO CAPS
300.0000 mg | ORAL_CAPSULE | Freq: Every day | ORAL | Status: DC
  Administered 2024-08-04: 300 mg via ORAL
  Filled 2024-08-04 (×2): qty 1

## 2024-08-04 MED ORDER — VITAMIN B-12 1000 MCG PO TABS
1000.0000 ug | ORAL_TABLET | Freq: Every day | ORAL | Status: DC
  Administered 2024-08-04 – 2024-08-05 (×2): 1000 ug via ORAL
  Filled 2024-08-04: qty 2
  Filled 2024-08-04: qty 1

## 2024-08-04 NOTE — Progress Notes (Signed)
 [facility]  ROUNDING NOTE   Subjective:   Eugene [name] r is a 63 y.o. male with past medical conditions including diabetes, bilateral BKA, suprapubic catheter, hepatitis C, and end-stage renal disease on hemodialysis.  Patient presented to the emergency department from nursing facility with shortness of breath and has been admitted for SOB (shortness of breath) [R06.02] ESRD needing dialysis (HCC) [N18.6, Z99.2]   Patient is known to our practice and receives outpatient dialysis treatments at [facility] on a MWF schedule, supervised by Dr. Dennise.  Patient reports he has not received a good dialysis treatment in about 2 weeks due to malfunctioning dialysis access.  States catheter has not functioned well but has been exchanged at least twice.  Still continues to not function well.  Patient received 30 minutes to an hour of treatment today.  Patient states he has not had any fluid removal due to either access issues or hypotension in outpatient clinic.  Currently speaking in full sentences without difficulty.  Remains on 6 L nasal cannula.  Labs currently unremarkable for renal patient.  Hemoglobin 10.9.Chest xray shows bilateral pleural effusion.   We have been consulted to manage dialysis needs  Objective:  Vital signs in last 24 hours:  Temp:  [98.1 F (36.7 C)-98.7 F (37.1 C)] 98.1 F (36.7 C) (11/11 1431) Pulse Rate:  [74-105] 77 (11/11 1431) Resp:  [16-20] 17 (11/11 1431) BP: (114-186)/(58-111) 186/88 (11/11 1431) SpO2:  [94 %-99 %] 97 % (11/11 1431)  Weight change:  There were no vitals filed for this visit.  Intake/Output: No intake/output data recorded.   Intake/Output this shift:  No intake/output data recorded.  Physical Exam: General: NAD  Head: Normocephalic, atraumatic. Moist oral mucosal membranes  Eyes: Anicteric  Lungs:  Diminished, 6L [place]  Heart: Regular rate and rhythm  Abdomen:  Soft, nontender  Extremities:  No peripheral edema.   Neurologic: Awake, alert, conversant  Skin: Warm,dry, no rash  Access: Rt internal jugular permcath    Basic Metabolic Panel: Recent Labs  Lab 08/03/24 1359 08/03/24 1752  NA 138  --   K 4.9  --   CL 104  --   CO2 23  --   GLUCOSE 241*  --   BUN 50*  --   CREATININE 4.14*  --   CALCIUM  7.8*  --   MG  --  2.1    Liver Function Tests: No results for input(s): AST, ALT, ALKPHOS, BILITOT, PROT, ALBUMIN  in the last 168 hours. No results for input(s): LIPASE, AMYLASE in the last 168 hours. No results for input(s): AMMONIA in the last 168 hours.  CBC: Recent Labs  Lab 08/03/24 1359  WBC 7.4  NEUTROABS 5.3  HGB 10.9*  HCT 34.5*  MCV 92.7  PLT 183    Cardiac Enzymes: No results for input(s): CKTOTAL, CKMB, CKMBINDEX, TROPONINI in the last 168 hours.  BNP: Invalid input(s): POCBNP  CBG: Recent Labs  Lab 08/04/24 0759  GLUCAP 151*    Microbiology: Results for orders placed or performed during the hospital encounter of 06/22/24  Culture, blood (routine x 2)     Status: None   Collection Time: 06/22/24  3:53 PM   Specimen: BLOOD  Result Value Ref Range Status   Specimen Description BLOOD BLOOD LEFT ARM  Final   Special Requests   Final    BOTTLES DRAWN AEROBIC AND ANAEROBIC Blood Culture adequate volume   Culture   Final    NO GROWTH 5 DAYS Performed at [facility], [address] [place], [place] [address]    Report Status 06/27/2024 FINAL  Final  Resp panel by RT-PCR (RSV, Flu A&B, Covid) Anterior Nasal Swab     Status: None   Collection Time: 06/22/24  3:53 PM   Specimen: Anterior Nasal Swab  Result Value Ref Range Status   SARS Coronavirus 2 by RT PCR NEGATIVE NEGATIVE Final    Comment: (NOTE) SARS-CoV-2 target nucleic acids are NOT DETECTED.  The SARS-CoV-2 RNA is generally detectable in upper respiratory specimens during the acute phase of infection. The lowest concentration of SARS-CoV-2 viral copies this  assay can detect is 138 copies/mL. A negative result does not preclude SARS-Cov-2 infection and should not be used as the sole basis for treatment or other patient management decisions. A negative result may occur with  improper specimen collection/handling, submission of specimen other than nasopharyngeal swab, presence of viral mutation(s) within the areas targeted by this assay, and inadequate number of viral copies(<138 copies/mL). A negative result must be combined with clinical observations, patient history, and epidemiological information. The expected result is Negative.  Fact Sheet for Patients:  bloggercourse.com  Fact Sheet for Healthcare Providers:  seriousbroker.it  This test is no t yet approved or cleared by the [place]  FDA and  has been authorized for detection and/or diagnosis of SARS-CoV-2 by FDA under an Emergency Use Authorization (EUA). This EUA will remain  in effect (meaning this test can be used) for the duration of the COVID-19 declaration under Section 564(b)(1) of the Act, 21 U.S.C.section 360bbb-3(b)(1), unless the authorization is terminated  or revoked sooner.       Influenza A by PCR NEGATIVE NEGATIVE Final   Influenza B by PCR NEGATIVE NEGATIVE Final    Comment: (NOTE) The Xpert Xpress SARS-CoV-2/FLU/RSV plus assay is intended as an aid in the diagnosis of influenza from Nasopharyngeal swab specimens and should not be used as a sole basis for treatment. Nasal washings and aspirates are unacceptable for Xpert Xpress SARS-CoV-2/FLU/RSV testing.  Fact Sheet for Patients: bloggercourse.com  Fact Sheet for Healthcare Providers: seriousbroker.it  This test is not yet approved or cleared by the [place]  FDA and has been authorized for detection and/or diagnosis of SARS-CoV-2 by FDA under an Emergency Use Authorization (EUA). This EUA will  remain in effect (meaning this test can be used) for the duration of the COVID-19 declaration under Section 564(b)(1) of the Act, 21 U.S.C. section 360bbb-3(b)(1), unless the authorization is terminated or revoked.     Resp Syncytial Virus by PCR NEGATIVE NEGATIVE Final    Comment: (NOTE) Fact Sheet for Patients: bloggercourse.com  Fact Sheet for Healthcare Providers: seriousbroker.it  This test is not yet approved or cleared by the [place]  FDA and has been authorized for detection and/or diagnosis of SARS-CoV-2 by FDA under an Emergency Use Authorization (EUA). This EUA will remain in effect (meaning this test can be used) for the duration of the COVID-19 declaration under Section 564(b)(1) of the Act, 21 U.S.C. section 360bbb-3(b)(1), unless the authorization is terminated or revoked.  Performed at [facility], [address], [place], [place] [address]   Urine Culture     Status: Abnormal   Collection Time: 06/22/24  4:00 PM   Specimen: Urine, Suprapubic  Result Value Ref Range Status   Specimen Description   Final    URINE, SUPRAPUBIC Performed at [facility], [address], [place], [place] [address]    Special Requests   Final  NONE Performed at [facility], [address] [place], [place] [address]    Culture (A)  Final    >=100,000 COLONIES/mL ESCHERICHIA COLI >=100,000 COLONIES/mL PROVIDENCIA STUARTII Confirmed Extended Spectrum Beta-Lactamase Producer (ESBL).  In bloodstream infections from ESBL organisms, carbapenems are preferred over piperacillin /tazobactam. They are shown to have a lower risk of mortality.    Report Status 06/25/2024 FINAL  Final   Organism ID, Bacteria PROVIDENCIA STUARTII (A)  Final   Organism ID, Bacteria ESCHERICHIA COLI (A)  Final      Susceptibility   Escherichia coli - MIC*    AMPICILLIN >=32 RESISTANT Resistant     CEFAZOLIN  (URINE) Value  in next row Resistant      >=32 RESISTANTThis is a modified FDA-approved test that has been validated and its performance characteristics determined by the reporting laboratory.  This laboratory is certified under the Clinical Laboratory Improvement Amendments CLIA as qualified to perform high complexity clinical laboratory testing.    CEFEPIME  Value in next row Resistant      >=32 RESISTANTThis is a modified FDA-approved test that has been validated and its performance characteristics determined by the reporting laboratory.  This laboratory is certified under the Clinical Laboratory Improvement Amendments CLIA as qualified to perform high complexity clinical laboratory testing.    ERTAPENEM Value in next row Sensitive      >=32 RESISTANTThis is a modified FDA-approved test that has been validated and its performance characteristics determined by the reporting laboratory.  This laboratory is certified under the Clinical Laboratory Improvement Amendments CLIA as qualified to perform high complexity clinical laboratory testing.    CEFTRIAXONE  Value in next row Resistant      >=32 RESISTANTThis is a modified FDA-approved test that has been validated and its performance characteristics determined by the reporting laboratory.  This laboratory is certified under the Clinical Laboratory Improvement Amendments CLIA as qualified to perform high complexity clinical laboratory testing.    CIPROFLOXACIN  Value in next row Resistant      >=32 RESISTANTThis is a modified FDA-approved test that has been validated and its performance characteristics determined by the reporting laboratory.  This laboratory is certified under the Clinical Laboratory Improvement Amendments CLIA as qualified to perform high complexity clinical laboratory testing.    GENTAMICIN Value in next row Sensitive      >=32 RESISTANTThis is a modified FDA-approved test that has been validated and its performance characteristics determined by the  reporting laboratory.  This laboratory is certified under the Clinical Laboratory Improvement Amendments CLIA as qualified to perform high complexity clinical laboratory testing.    NITROFURANTOIN Value in next row Sensitive      >=32 RESISTANTThis is a modified FDA-approved test that has been validated and its performance characteristics determined by the reporting laboratory.  This laboratory is certified under the Clinical Laboratory Improvement Amendments CLIA as qualified to perform high complexity clinical laboratory testing.    TRIMETH /SULFA  Value in next row Sensitive      >=32 RESISTANTThis is a modified FDA-approved test that has been validated and its performance characteristics determined by the reporting laboratory.  This laboratory is certified under the Clinical Laboratory Improvement Amendments CLIA as qualified to perform high complexity clinical laboratory testing.    AMPICILLIN/SULBACTAM Value in next row Resistant      >=32 RESISTANTThis is a modified FDA-approved test that has been validated and its performance characteristics determined by the reporting laboratory.  This laboratory is certified under the Clinical Laboratory Improvement Amendments CLIA as qualified to  perform high complexity clinical laboratory testing.    PIP/TAZO Value in next row Sensitive      16 SENSITIVEThis is a modified FDA-approved test that has been validated and its performance characteristics determined by the reporting laboratory.  This laboratory is certified under the Clinical Laboratory Improvement Amendments CLIA as qualified to perform high complexity clinical laboratory testing.    MEROPENEM Value in next row Sensitive      16 SENSITIVEThis is a modified FDA-approved test that has been validated and its performance characteristics determined by the reporting laboratory.  This laboratory is certified under the Clinical Laboratory Improvement Amendments CLIA as qualified to perform high complexity  clinical laboratory testing.    * >=100,000 COLONIES/mL ESCHERICHIA COLI   Providencia stuartii - MIC*    AMPICILLIN Value in next row Resistant      16 SENSITIVEThis is a modified FDA-approved test that has been validated and its performance characteristics determined by the reporting laboratory.  This laboratory is certified under the Clinical Laboratory Improvement Amendments CLIA as qualified to perform high complexity clinical laboratory testing.    CEFEPIME  Value in next row Sensitive      16 SENSITIVEThis is a modified FDA-approved test that has been validated and its performance characteristics determined by the reporting laboratory.  This laboratory is certified under the Clinical Laboratory Improvement Amendments CLIA as qualified to perform high complexity clinical laboratory testing.    ERTAPENEM Value in next row Sensitive      16 SENSITIVEThis is a modified FDA-approved test that has been validated and its performance characteristics determined by the reporting laboratory.  This laboratory is certified under the Clinical Laboratory Improvement Amendments CLIA as qualified to perform high complexity clinical laboratory testing.    CEFTRIAXONE  Value in next row Sensitive      16 SENSITIVEThis is a modified FDA-approved test that has been validated and its performance characteristics determined by the reporting laboratory.  This laboratory is certified under the Clinical Laboratory Improvement Amendments CLIA as qualified to perform high complexity clinical laboratory testing.    CIPROFLOXACIN  Value in next row Resistant      16 SENSITIVEThis is a modified FDA-approved test that has been validated and its performance characteristics determined by the reporting laboratory.  This laboratory is certified under the Clinical Laboratory Improvement Amendments CLIA as qualified to perform high complexity clinical laboratory testing.    GENTAMICIN Value in next row Resistant      16 SENSITIVEThis is  a modified FDA-approved test that has been validated and its performance characteristics determined by the reporting laboratory.  This laboratory is certified under the Clinical Laboratory Improvement Amendments CLIA as qualified to perform high complexity clinical laboratory testing.    NITROFURANTOIN Value in next row Resistant      16 SENSITIVEThis is a modified FDA-approved test that has been validated and its performance characteristics determined by the reporting laboratory.  This laboratory is certified under the Clinical Laboratory Improvement Amendments CLIA as qualified to perform high complexity clinical laboratory testing.    TRIMETH /SULFA  Value in next row Sensitive      16 SENSITIVEThis is a modified FDA-approved test that has been validated and its performance characteristics determined by the reporting laboratory.  This laboratory is certified under the Clinical Laboratory Improvement Amendments CLIA as qualified to perform high complexity clinical laboratory testing.    AMPICILLIN/SULBACTAM Value in next row Intermediate      16 SENSITIVEThis is a modified FDA-approved test that has been validated and its performance  characteristics determined by the reporting laboratory.  This laboratory is certified under the Clinical Laboratory Improvement Amendments CLIA as qualified to perform high complexity clinical laboratory testing.    PIP/TAZO Value in next row Sensitive      <=4 SENSITIVEThis is a modified FDA-approved test that has been validated and its performance characteristics determined by the reporting laboratory.  This laboratory is certified under the Clinical Laboratory Improvement Amendments CLIA as qualified to perform high complexity clinical laboratory testing.    MEROPENEM Value in next row Sensitive      <=4 SENSITIVEThis is a modified FDA-approved test that has been validated and its performance characteristics determined by the reporting laboratory.  This laboratory is  certified under the Clinical Laboratory Improvement Amendments CLIA as qualified to perform high complexity clinical laboratory testing.    * >=100,000 COLONIES/mL PROVIDENCIA STUARTII  Culture, blood (routine x 2)     Status: None   Collection Time: 06/23/24 11:23 AM   Specimen: BLOOD  Result Value Ref Range Status   Specimen Description BLOOD BLOOD RIGHT HAND  Final   Special Requests   Final    BOTTLES DRAWN AEROBIC AND ANAEROBIC Blood Culture adequate volume   Culture   Final    NO GROWTH 5 DAYS Performed at [facility], [address] [place], [place] [address]    Report Status 06/28/2024 FINAL  Final  Body fluid culture w Gram Stain     Status: None   Collection Time: 06/24/24  3:55 PM   Specimen: PATH Cytology Pleural fluid  Result Value Ref Range Status   Specimen Description   Final    PLEURAL Performed at [facility], [address], [place], [place] [address]    Special Requests   Final    NONE Performed at [facility], [address] [place], [place] [address]    Gram Stain   Final    WBC PRESENT,BOTH PMN AND MONONUCLEAR NO ORGANISMS SEEN CYTOSPIN SMEAR    Culture   Final    NO GROWTH 3 DAYS Performed at [facility], [address], [place], [place] [address]    Report Status 06/27/2024 FINAL  Final    Coagulation Studies: No results for input(s): LABPROT, INR in the last 72 hours.  Urinalysis: No results for input(s): COLORURINE, LABSPEC, PHURINE, GLUCOSEU, HGBUR, BILIRUBINUR, KETONESUR, PROTEINUR, UROBILINOGEN, NITRITE, LEUKOCYTESUR in the last 72 hours.  Invalid input(s): APPERANCEUR    Imaging: DG Chest Portable 1 View Result Date: 08/03/2024 CLINICAL DATA:  Shortness of breath. EXAM: PORTABLE CHEST 1 VIEW COMPARISON:  Chest radiograph dated 06/28/2024. FINDINGS: Right-sided dialysis catheter with tip at the cavoatrial junction. Bilateral pleural effusions for left greater and right  and similar to prior radiograph. No pneumothorax. Stable cardiac silhouette no acute osseous pathology. Spinal fusion hardware. IMPRESSION: Bilateral pleural effusions, left greater than right. Overall no significant interval change since the prior radiograph. Electronically Signed   By: Vanetta Chou M.D.   On: 08/03/2024 13:42     Medications:     bisacodyl   10 mg Oral QHS   carvedilol   6.25 mg Oral BID WC   [START ON 08/05/2024] Chlorhexidine  Gluconate Cloth  6 each Topical Q0600   cyanocobalamin   1,000 mcg Oral Daily   diltiazem   240 mg Oral QPM   famotidine   10 mg Oral Daily   furosemide   80 mg Oral Daily   gabapentin   300 mg Oral QHS   heparin   5,000 Units Subcutaneous Q8H  insulin  aspart  0-9 Units Subcutaneous TID WC   levothyroxine   175 mcg Oral QHS   linaclotide   145 mcg Oral QAC breakfast   loratadine   10 mg Oral Daily   sodium chloride  flush  3 mL Intravenous Q12H   [START ON 08/05/2024] Vitamin D  (Ergocalciferol )  50,000 Units Oral Q7 days   acetaminophen  **OR** acetaminophen , oxyCODONE , senna-docusate  Assessment/ Plan:  Mr. Lillard Bailon is a 63 y.o.  male with past medical conditions including diabetes, bilateral BKA, suprapubic catheter, hepatitis C, and end-stage renal disease on hemodialysis.  Patient presented to the emergency department from nursing facility with shortness of breath and have been admitted for SOB (shortness of breath) [R06.02] ESRD needing dialysis (HCC) [N18.6, Z99.2]  [facility]/MWF/right PermCath   End stage renal disease on hemodialysis.  Patient received about 30 minutes of treatment today.  Multiple access issues outpatient have prevented consistent treatments.  Will attempt to use current PermCath for dialysis today, UF goal 1.5 L and assess.  Will plan for additional treatment tomorrow to maintain outpatient schedule.  2. Anemia of chronic kidney disease Lab Results  Component Value Date   HGB 10.9 (L) 08/03/2024   Hemoglobin acceptable.  No need for ESA's.  3. Secondary Hyperparathyroidism: with outpatient labs: None available Lab Results  Component Value Date   PTH 177 (H) 04/23/2024   CALCIUM  7.8 (L) 08/03/2024   PHOS 3.7 06/29/2024    Will continue to monitor bone minerals during this admission.  4.  Hypertension with chronic kidney disease.  Currently receiving carvedilol , diltiazem , and furosemide .   LOS: 1 Destina Mantei 11/11/20253:21 PM

## 2024-08-04 NOTE — ED Notes (Signed)
 Pt was transfer from room 5 to room 38.  Upon assessment there pt stated that he had a BM and needed assistance in being cleaned.  The pt was noted to have feces up his back all the way to his knees. He states he had a BM this morning and has not been cleaned since. The pt states that he informed the prior RN and Cna and they did not help him.  The pt was cleaned and his clothes were removed and  placed in a pt's belonging bag.    The pt was also noted to have signs of skin break down to his scaral region and rectum. Barrier cream was applied and a Mexiplex was placed.

## 2024-08-04 NOTE — ED Notes (Addendum)
 PT IS REFUSING ALL MEDICATION AND BLOOD WORK AT THIS TIME

## 2024-08-04 NOTE — Progress Notes (Addendum)
 PROGRESS NOTE    Eugene [name]  FMW:[id number] DOB: August 10, 1961 DOA: 08/03/2024 PCP: [facility]    Brief Narrative:  63 year old male with PMHx of ESRD on HD MWF, chronic hypoxic respiratory failure on 6L [place], b/l BKA, HFpEF, T2DM, HTN, HLD, hypothyroidism, chronic pain syndrome, obesity,  who presented to [facility] ED on 08/03/2024 from his HD session after developing shortness of breath and hypotension while getting dialysis. There was also concern about his Port-A-Cath malfunctioning, which he had placed on 07/21/2024.  In the ED, he was afebrile, hemodynamically stable, SpO2 95% on home 6 L nasal cannula.  CBC showed chronic anemia, BMP consistent with ESRD status, Mg 2.1, hs Troponins unremarkable.  CXR showed bilateral pleural effusions, left greater than right, stable since prior imaging at the beginning of October.   Assessment and Plan:  # ESRD on HD - Last outpatient HD session on 11/10 which was limited due to HD access issues, hypotension, and SOB - Currently, no emergent indication for HD. Not overtly volume overload, stable on home O2 of 6L [place], and unremarkable labs - Nephrology consulted for HD and assessment of HD catheter - On the schedule to receive HD today - Refusing lab work  # Anemia of CKD - Hgb stable  # Chronic hypoxic respiratory failure - Stable on home 6L [place]  # Bilateral pleural effusions  L > R - Imaging showed stable b/l pleural effusions - Respiratory status at baseline  - Consider drainage if does not improve with HD  # Hypertension - Continue PTA Coreg , Cardizem , and Lasix   # Hyperlipidemia - Continue PTA  # Hypothyroidism - Continue home levothyroxine  175 mcg  #T2DM - SSI  #HFpEF - Continue home Lasix  80 mg daily  # Chronic pain - Continue home Oxycodone  10 mg q6h  # Class II Obesity (BMI Body mass index is 38.15 kg/m. kg/m) - Obesity is clinically significant and contributes to T2DM, HTN, HLD, and HFpEF.  - Discussed  lifestyle modification including dietary changes, regular physical activity, and weight reduction strategies. Will continue to monitor weight and BMI during hospitalization.     DVT prophylaxis: heparin  injection 5,000 Units Start: 08/03/24 2200   Code Status:   Code Status: Full Code  Family Communication: None  Disposition Plan: Back to facility PT Follow up Recs:   Level of care: Telemetry  Consultants:  Nephrology  Procedures:  None   Subjective: Patient examined bedside.  Denies any increased shortness of breath, chest pain, abdominal pain, nausea, vomiting.  He is irritated about being in the hospital not having received dialysis thus far none. Seen eating lunch. Refusing lab work.   Objective: Vitals:   08/04/24 0700 08/04/24 0800 08/04/24 1045 08/04/24 1051  BP: 115/66   114/67  Pulse: (!) 105 89 74 74  Resp: 17 20 16 16   Temp:  98.7 F (37.1 C)  98.3 F (36.8 C)  TempSrc:  Oral  Oral  SpO2: 99% 96% 96% 96%   No intake or output data in the 24 hours ending 08/04/24 1416 There were no vitals filed for this visit.  Examination:  General exam: Appears calm and comfortable, obese Respiratory system: No increased WOB. CTAB. [place] in place on 6L/m Cardiovascular system: +S1/S2, RRR. No JVD or murmurs. 1+ Pitting edema in BLE Gastrointestinal system: Soft, NT, distended. No masses felt. Normal bowel sounds. Central nervous system: Alert and oriented. No focal neurological deficits. Extremities: 5/5 strength symmetrically. BLE BKA Skin: No rashes, lesions or ulcers Psychiatry:  Judgement and insight appear normal. Mood & affect appropriate.     Data Reviewed: I have personally reviewed following labs and imaging studies  CBC: Recent Labs  Lab 08/03/24 1359  WBC 7.4  NEUTROABS 5.3  HGB 10.9*  HCT 34.5*  MCV 92.7  PLT 183   Basic Metabolic Panel: Recent Labs  Lab 08/03/24 1359 08/03/24 1752  NA 138  --   K 4.9  --   CL 104  --   CO2 23  --    GLUCOSE 241*  --   BUN 50*  --   CREATININE 4.14*  --   CALCIUM  7.8*  --   MG  --  2.1   GFR: CrCl cannot be calculated (Unknown ideal weight.). Liver Function Tests: No results for input(s): AST, ALT, ALKPHOS, BILITOT, PROT, ALBUMIN  in the last 168 hours. No results for input(s): LIPASE, AMYLASE in the last 168 hours. No results for input(s): AMMONIA in the last 168 hours. Coagulation Profile: No results for input(s): INR, PROTIME in the last 168 hours. Cardiac Enzymes: No results for input(s): CKTOTAL, CKMB, CKMBINDEX, TROPONINI in the last 168 hours. BNP (last 3 results) No results for input(s): PROBNP in the last 8760 hours. HbA1C: No results for input(s): HGBA1C in the last 72 hours. CBG: Recent Labs  Lab 08/04/24 0759  GLUCAP 151*   Lipid Profile: No results for input(s): CHOL, HDL, LDLCALC, TRIG, CHOLHDL, LDLDIRECT in the last 72 hours. Thyroid Function Tests: No results for input(s): TSH, T4TOTAL, FREET4, T3FREE, THYROIDAB in the last 72 hours. Anemia Panel: No results for input(s): VITAMINB12, FOLATE, FERRITIN, TIBC, IRON , RETICCTPCT in the last 72 hours. Sepsis Labs: No results for input(s): PROCALCITON, LATICACIDVEN in the last 168 hours.  No results found for this or any previous visit (from the past 240 hours).   Radiology Studies: DG Chest Portable 1 View Result Date: 08/03/2024 CLINICAL DATA:  Shortness of breath. EXAM: PORTABLE CHEST 1 VIEW COMPARISON:  Chest radiograph dated 06/28/2024. FINDINGS: Right-sided dialysis catheter with tip at the cavoatrial junction. Bilateral pleural effusions for left greater and right and similar to prior radiograph. No pneumothorax. Stable cardiac silhouette no acute osseous pathology. Spinal fusion hardware. IMPRESSION: Bilateral pleural effusions, left greater than right. Overall no significant interval change since the prior radiograph. Electronically  Signed   By: Vanetta Chou M.D.   On: 08/03/2024 13:42    Scheduled Meds:  bisacodyl   10 mg Oral QHS   carvedilol   6.25 mg Oral BID WC   cyanocobalamin   1,000 mcg Oral Daily   diltiazem   240 mg Oral QPM   famotidine   10 mg Oral Daily   furosemide   80 mg Oral Daily   gabapentin   300 mg Oral QHS   heparin   5,000 Units Subcutaneous Q8H   insulin  aspart  0-9 Units Subcutaneous TID WC   levothyroxine   175 mcg Oral QHS   linaclotide   145 mcg Oral QAC breakfast   loratadine   10 mg Oral Daily   sodium chloride  flush  3 mL Intravenous Q12H   [START ON 08/05/2024] Vitamin D  (Ergocalciferol )  50,000 Units Oral Q7 days   Continuous Infusions:   LOS:  LOS: 1 day   Time Spent: 35 minutes  Unresulted Labs (From admission, onward)     Start     Ordered   08/04/24 0500  CBC  Tomorrow morning,   R        08/03/24 1858   08/04/24 0500  Renal function panel  Tomorrow morning,  R        08/03/24 1858             Duffy Larch, MD [facility]  If 7PM-7AM, please contact night-coverage  08/04/2024, 2:16 PM

## 2024-08-04 NOTE — ED Notes (Signed)
 NURSE JENNIFER INFORMED OF ASSIGNED BED

## 2024-08-04 NOTE — ED Notes (Addendum)
 Pt nothing is being done for him and that he has not had anyone talk to him about his medications and at this point he just wants to be left alone.

## 2024-08-05 DIAGNOSIS — N186 End stage renal disease: Secondary | ICD-10-CM | POA: Diagnosis not present

## 2024-08-05 DIAGNOSIS — Z992 Dependence on renal dialysis: Secondary | ICD-10-CM | POA: Diagnosis not present

## 2024-08-05 LAB — CBC
HCT: 33.2 % — ABNORMAL LOW (ref 39.0–52.0)
Hemoglobin: 10.3 g/dL — ABNORMAL LOW (ref 13.0–17.0)
MCH: 29.2 pg (ref 26.0–34.0)
MCHC: 31 g/dL (ref 30.0–36.0)
MCV: 94.1 fL (ref 80.0–100.0)
Platelets: 174 K/uL (ref 150–400)
RBC: 3.53 MIL/uL — ABNORMAL LOW (ref 4.22–5.81)
RDW: 13.9 % (ref 11.5–15.5)
WBC: 5.8 K/uL (ref 4.0–10.5)
nRBC: 0 % (ref 0.0–0.2)

## 2024-08-05 LAB — RENAL FUNCTION PANEL
Albumin: 2.6 g/dL — ABNORMAL LOW (ref 3.5–5.0)
Anion gap: 7 (ref 5–15)
BUN: 32 mg/dL — ABNORMAL HIGH (ref 8–23)
CO2: 27 mmol/L (ref 22–32)
Calcium: 8 mg/dL — ABNORMAL LOW (ref 8.9–10.3)
Chloride: 103 mmol/L (ref 98–111)
Creatinine, Ser: 3.46 mg/dL — ABNORMAL HIGH (ref 0.61–1.24)
GFR, Estimated: 19 mL/min — ABNORMAL LOW (ref >60)
Glucose, Bld: 192 mg/dL — ABNORMAL HIGH (ref 70–99)
Phosphorus: 4.1 mg/dL (ref 2.5–4.6)
Potassium: 4.6 mmol/L (ref 3.5–5.1)
Sodium: 137 mmol/L (ref 135–145)

## 2024-08-05 LAB — GLUCOSE, CAPILLARY
Glucose-Capillary: 179 mg/dL — ABNORMAL HIGH (ref 70–99)
Glucose-Capillary: 254 mg/dL — ABNORMAL HIGH (ref 70–99)

## 2024-08-05 MED ORDER — OXYCODONE HCL 5 MG PO TABS
ORAL_TABLET | ORAL | Status: AC
  Filled 2024-08-05: qty 2

## 2024-08-05 MED ORDER — HEPARIN SODIUM (PORCINE) 1000 UNIT/ML IJ SOLN
INTRAMUSCULAR | Status: AC
  Filled 2024-08-05: qty 4

## 2024-08-05 NOTE — Progress Notes (Signed)
 Pt receives outpt HD at  [facility] on MWF 11:45am chair time. Navigator following to assist with any HD needs.  Suzen Satchel Dialysis Navigator [phone]

## 2024-08-05 NOTE — Progress Notes (Signed)
 Hemodialysis Note:  Received patient in bed to unit. Alert and oriented. Informed consent singed and in chart.  Treatment initiated: 0846 Treatment completed: 1234  Access used: Right internal jugular catheter Access issues: None  Patient tolerated well. Transported back to room, alert without acute distress. Report given to patient's RN.  Total UF removed: 2 Liters Medications given: Oxycodone  10 mg Tablet  Post HD weight: 113.4 Kg  [facility]

## 2024-08-05 NOTE — Progress Notes (Signed)
 [facility]  ROUNDING NOTE   Subjective:   Eugene [name] r is a 63 y.o. male with past medical conditions including diabetes, bilateral BKA, suprapubic catheter, hepatitis C, and end-stage renal disease on hemodialysis.  Patient presented to the emergency department from nursing facility with shortness of breath and has been admitted for SOB (shortness of breath) [R06.02] ESRD needing dialysis (HCC) [N18.6, Z99.2]   Patient is known to our practice and receives outpatient dialysis treatments at [facility] on a MWF schedule, supervised by Dr. Dennise.    Patient seen and evaluated during dialysis   HEMODIALYSIS FLOWSHEET:  Blood Flow Rate (mL/min): 349 mL/min Arterial Pressure (mmHg): -168.68 mmHg Venous Pressure (mmHg): 162.82 mmHg TMP (mmHg): 5.45 mmHg Ultrafiltration Rate (mL/min): 857 mL/min Dialysate Flow Rate (mL/min): 300 ml/min Dialysis Fluid Bolus: Normal Saline Bolus Amount (mL): 100 mL  Tolerating treatment well  Objective:  Vital signs in last 24 hours:  Temp:  [97.3 F (36.3 C)-98.9 F (37.2 C)] 98.6 F (37 C) (11/12 0835) Pulse Rate:  [40-82] 76 (11/12 1030) Resp:  [11-22] 15 (11/12 1030) BP: (114-186)/(52-111) 127/76 (11/12 1030) SpO2:  [95 %-100 %] 98 % (11/12 1030) Weight:  [115.4 kg-120.6 kg] 115.4 kg (11/12 0835)  Weight change:  Filed Weights   08/04/24 1545 08/05/24 0324 08/05/24 0835  Weight: 120.6 kg 120.6 kg 115.4 kg    Intake/Output: I/O last 3 completed shifts: In: 118 [P.O.:118] Out: 1200 [Urine:1200]   Intake/Output this shift:  No intake/output data recorded.  Physical Exam: General: NAD  Head: Normocephalic, atraumatic. Moist oral mucosal membranes  Eyes: Anicteric  Lungs:  Diminished, 6L [place]  Heart: Regular rate and rhythm  Abdomen:  Soft, nontender  Extremities:  No peripheral edema.Bilateral BKA  Neurologic: Awake, alert, conversant  Skin: Warm,dry, no rash  Access: Rt internal jugular permcath    Basic  Metabolic Panel: Recent Labs  Lab 08/03/24 1359 08/03/24 1752 08/05/24 0900  NA 138  --  137  K 4.9  --  4.6  CL 104  --  103  CO2 23  --  27  GLUCOSE 241*  --  192*  BUN 50*  --  32*  CREATININE 4.14*  --  3.46*  CALCIUM  7.8*  --  8.0*  MG  --  2.1  --   PHOS  --   --  4.1    Liver Function Tests: Recent Labs  Lab 08/05/24 0900  ALBUMIN  2.6*   No results for input(s): LIPASE, AMYLASE in the last 168 hours. No results for input(s): AMMONIA in the last 168 hours.  CBC: Recent Labs  Lab 08/03/24 1359 08/05/24 0900  WBC 7.4 5.8  NEUTROABS 5.3  --   HGB 10.9* 10.3*  HCT 34.5* 33.2*  MCV 92.7 94.1  PLT 183 174    Cardiac Enzymes: No results for input(s): CKTOTAL, CKMB, CKMBINDEX, TROPONINI in the last 168 hours.  BNP: Invalid input(s): POCBNP  CBG: Recent Labs  Lab 08/04/24 0759 08/04/24 1832 08/04/24 2011  GLUCAP 151* 176* 200*    Microbiology: Results for orders placed or performed during the hospital encounter of 06/22/24  Culture, blood (routine x 2)     Status: None   Collection Time: 06/22/24  3:53 PM   Specimen: BLOOD  Result Value Ref Range Status   Specimen Description BLOOD BLOOD LEFT ARM  Final   Special Requests   Final    BOTTLES DRAWN AEROBIC AND ANAEROBIC Blood Culture adequate volume   Culture   Final  NO GROWTH 5 DAYS Performed at [facility], [address] [place], [place] [address]    Report Status 06/27/2024 FINAL  Final  Resp panel by RT-PCR (RSV, Flu A&B, Covid) Anterior Nasal Swab     Status: None   Collection Time: 06/22/24  3:53 PM   Specimen: Anterior Nasal Swab  Result Value Ref Range Status   SARS Coronavirus 2 by RT PCR NEGATIVE NEGATIVE Final    Comment: (NOTE) SARS-CoV-2 target nucleic acids are NOT DETECTED.  The SARS-CoV-2 RNA is generally detectable in upper respiratory specimens during the acute phase of infection. The lowest concentration of SARS-CoV-2 viral copies this assay  can detect is 138 copies/mL. A negative result does not preclude SARS-Cov-2 infection and should not be used as the sole basis for treatment or other patient management decisions. A negative result may occur with  improper specimen collection/handling, submission of specimen other than nasopharyngeal swab, presence of viral mutation(s) within the areas targeted by this assay, and inadequate number of viral copies(<138 copies/mL). A negative result must be combined with clinical observations, patient history, and epidemiological information. The expected result is Negative.  Fact Sheet for Patients:  bloggercourse.com  Fact Sheet for Healthcare Providers:  seriousbroker.it  This test is no t yet approved or cleared by the [place]  FDA and  has been authorized for detection and/or diagnosis of SARS-CoV-2 by FDA under an Emergency Use Authorization (EUA). This EUA will remain  in effect (meaning this test can be used) for the duration of the COVID-19 declaration under Section 564(b)(1) of the Act, 21 U.S.C.section 360bbb-3(b)(1), unless the authorization is terminated  or revoked sooner.       Influenza A by PCR NEGATIVE NEGATIVE Final   Influenza B by PCR NEGATIVE NEGATIVE Final    Comment: (NOTE) The Xpert Xpress SARS-CoV-2/FLU/RSV plus assay is intended as an aid in the diagnosis of influenza from Nasopharyngeal swab specimens and should not be used as a sole basis for treatment. Nasal washings and aspirates are unacceptable for Xpert Xpress SARS-CoV-2/FLU/RSV testing.  Fact Sheet for Patients: bloggercourse.com  Fact Sheet for Healthcare Providers: seriousbroker.it  This test is not yet approved or cleared by the [place]  FDA and has been authorized for detection and/or diagnosis of SARS-CoV-2 by FDA under an Emergency Use Authorization (EUA). This EUA will  remain in effect (meaning this test can be used) for the duration of the COVID-19 declaration under Section 564(b)(1) of the Act, 21 U.S.C. section 360bbb-3(b)(1), unless the authorization is terminated or revoked.     Resp Syncytial Virus by PCR NEGATIVE NEGATIVE Final    Comment: (NOTE) Fact Sheet for Patients: bloggercourse.com  Fact Sheet for Healthcare Providers: seriousbroker.it  This test is not yet approved or cleared by the [place]  FDA and has been authorized for detection and/or diagnosis of SARS-CoV-2 by FDA under an Emergency Use Authorization (EUA). This EUA will remain in effect (meaning this test can be used) for the duration of the COVID-19 declaration under Section 564(b)(1) of the Act, 21 U.S.C. section 360bbb-3(b)(1), unless the authorization is terminated or revoked.  Performed at [facility], [address] [place], [place] [address]   Urine Culture     Status: Abnormal   Collection Time: 06/22/24  4:00 PM   Specimen: Urine, Suprapubic  Result Value Ref Range Status   Specimen Description   Final    URINE, SUPRAPUBIC Performed at [facility], [address] [place], [place] [address]  Special Requests   Final    NONE Performed at [facility], [address] [place], [place] [address]    Culture (A)  Final    >=100,000 COLONIES/mL ESCHERICHIA COLI >=100,000 COLONIES/mL PROVIDENCIA STUARTII Confirmed Extended Spectrum Beta-Lactamase Producer (ESBL).  In bloodstream infections from ESBL organisms, carbapenems are preferred over piperacillin /tazobactam. They are shown to have a lower risk of mortality.    Report Status 06/25/2024 FINAL  Final   Organism ID, Bacteria PROVIDENCIA STUARTII (A)  Final   Organism ID, Bacteria ESCHERICHIA COLI (A)  Final      Susceptibility   Escherichia coli - MIC*    AMPICILLIN >=32 RESISTANT Resistant     CEFAZOLIN  (URINE) Value  in next row Resistant      >=32 RESISTANTThis is a modified FDA-approved test that has been validated and its performance characteristics determined by the reporting laboratory.  This laboratory is certified under the Clinical Laboratory Improvement Amendments CLIA as qualified to perform high complexity clinical laboratory testing.    CEFEPIME  Value in next row Resistant      >=32 RESISTANTThis is a modified FDA-approved test that has been validated and its performance characteristics determined by the reporting laboratory.  This laboratory is certified under the Clinical Laboratory Improvement Amendments CLIA as qualified to perform high complexity clinical laboratory testing.    ERTAPENEM Value in next row Sensitive      >=32 RESISTANTThis is a modified FDA-approved test that has been validated and its performance characteristics determined by the reporting laboratory.  This laboratory is certified under the Clinical Laboratory Improvement Amendments CLIA as qualified to perform high complexity clinical laboratory testing.    CEFTRIAXONE  Value in next row Resistant      >=32 RESISTANTThis is a modified FDA-approved test that has been validated and its performance characteristics determined by the reporting laboratory.  This laboratory is certified under the Clinical Laboratory Improvement Amendments CLIA as qualified to perform high complexity clinical laboratory testing.    CIPROFLOXACIN  Value in next row Resistant      >=32 RESISTANTThis is a modified FDA-approved test that has been validated and its performance characteristics determined by the reporting laboratory.  This laboratory is certified under the Clinical Laboratory Improvement Amendments CLIA as qualified to perform high complexity clinical laboratory testing.    GENTAMICIN Value in next row Sensitive      >=32 RESISTANTThis is a modified FDA-approved test that has been validated and its performance characteristics determined by the  reporting laboratory.  This laboratory is certified under the Clinical Laboratory Improvement Amendments CLIA as qualified to perform high complexity clinical laboratory testing.    NITROFURANTOIN Value in next row Sensitive      >=32 RESISTANTThis is a modified FDA-approved test that has been validated and its performance characteristics determined by the reporting laboratory.  This laboratory is certified under the Clinical Laboratory Improvement Amendments CLIA as qualified to perform high complexity clinical laboratory testing.    TRIMETH /SULFA  Value in next row Sensitive      >=32 RESISTANTThis is a modified FDA-approved test that has been validated and its performance characteristics determined by the reporting laboratory.  This laboratory is certified under the Clinical Laboratory Improvement Amendments CLIA as qualified to perform high complexity clinical laboratory testing.    AMPICILLIN/SULBACTAM Value in next row Resistant      >=32 RESISTANTThis is a modified FDA-approved test that has been validated and its performance characteristics determined by the reporting laboratory.  This laboratory is certified under the  Clinical Laboratory Improvement Amendments CLIA as qualified to perform high complexity clinical laboratory testing.    PIP/TAZO Value in next row Sensitive      16 SENSITIVEThis is a modified FDA-approved test that has been validated and its performance characteristics determined by the reporting laboratory.  This laboratory is certified under the Clinical Laboratory Improvement Amendments CLIA as qualified to perform high complexity clinical laboratory testing.    MEROPENEM Value in next row Sensitive      16 SENSITIVEThis is a modified FDA-approved test that has been validated and its performance characteristics determined by the reporting laboratory.  This laboratory is certified under the Clinical Laboratory Improvement Amendments CLIA as qualified to perform high complexity  clinical laboratory testing.    * >=100,000 COLONIES/mL ESCHERICHIA COLI   Providencia stuartii - MIC*    AMPICILLIN Value in next row Resistant      16 SENSITIVEThis is a modified FDA-approved test that has been validated and its performance characteristics determined by the reporting laboratory.  This laboratory is certified under the Clinical Laboratory Improvement Amendments CLIA as qualified to perform high complexity clinical laboratory testing.    CEFEPIME  Value in next row Sensitive      16 SENSITIVEThis is a modified FDA-approved test that has been validated and its performance characteristics determined by the reporting laboratory.  This laboratory is certified under the Clinical Laboratory Improvement Amendments CLIA as qualified to perform high complexity clinical laboratory testing.    ERTAPENEM Value in next row Sensitive      16 SENSITIVEThis is a modified FDA-approved test that has been validated and its performance characteristics determined by the reporting laboratory.  This laboratory is certified under the Clinical Laboratory Improvement Amendments CLIA as qualified to perform high complexity clinical laboratory testing.    CEFTRIAXONE  Value in next row Sensitive      16 SENSITIVEThis is a modified FDA-approved test that has been validated and its performance characteristics determined by the reporting laboratory.  This laboratory is certified under the Clinical Laboratory Improvement Amendments CLIA as qualified to perform high complexity clinical laboratory testing.    CIPROFLOXACIN  Value in next row Resistant      16 SENSITIVEThis is a modified FDA-approved test that has been validated and its performance characteristics determined by the reporting laboratory.  This laboratory is certified under the Clinical Laboratory Improvement Amendments CLIA as qualified to perform high complexity clinical laboratory testing.    GENTAMICIN Value in next row Resistant      16 SENSITIVEThis is  a modified FDA-approved test that has been validated and its performance characteristics determined by the reporting laboratory.  This laboratory is certified under the Clinical Laboratory Improvement Amendments CLIA as qualified to perform high complexity clinical laboratory testing.    NITROFURANTOIN Value in next row Resistant      16 SENSITIVEThis is a modified FDA-approved test that has been validated and its performance characteristics determined by the reporting laboratory.  This laboratory is certified under the Clinical Laboratory Improvement Amendments CLIA as qualified to perform high complexity clinical laboratory testing.    TRIMETH /SULFA  Value in next row Sensitive      16 SENSITIVEThis is a modified FDA-approved test that has been validated and its performance characteristics determined by the reporting laboratory.  This laboratory is certified under the Clinical Laboratory Improvement Amendments CLIA as qualified to perform high complexity clinical laboratory testing.    AMPICILLIN/SULBACTAM Value in next row Intermediate      16 SENSITIVEThis is a modified FDA-approved  test that has been validated and its performance characteristics determined by the reporting laboratory.  This laboratory is certified under the Clinical Laboratory Improvement Amendments CLIA as qualified to perform high complexity clinical laboratory testing.    PIP/TAZO Value in next row Sensitive      <=4 SENSITIVEThis is a modified FDA-approved test that has been validated and its performance characteristics determined by the reporting laboratory.  This laboratory is certified under the Clinical Laboratory Improvement Amendments CLIA as qualified to perform high complexity clinical laboratory testing.    MEROPENEM Value in next row Sensitive      <=4 SENSITIVEThis is a modified FDA-approved test that has been validated and its performance characteristics determined by the reporting laboratory.  This laboratory is  certified under the Clinical Laboratory Improvement Amendments CLIA as qualified to perform high complexity clinical laboratory testing.    * >=100,000 COLONIES/mL PROVIDENCIA STUARTII  Culture, blood (routine x 2)     Status: None   Collection Time: 06/23/24 11:23 AM   Specimen: BLOOD  Result Value Ref Range Status   Specimen Description BLOOD BLOOD RIGHT HAND  Final   Special Requests   Final    BOTTLES DRAWN AEROBIC AND ANAEROBIC Blood Culture adequate volume   Culture   Final    NO GROWTH 5 DAYS Performed at [facility], [address], [place], [place] [address]    Report Status 06/28/2024 FINAL  Final  Body fluid culture w Gram Stain     Status: None   Collection Time: 06/24/24  3:55 PM   Specimen: PATH Cytology Pleural fluid  Result Value Ref Range Status   Specimen Description   Final    PLEURAL Performed at [facility], [address] [place], [place] [address]    Special Requests   Final    NONE Performed at [facility], [address] [place], [place] [address]    Gram Stain   Final    WBC PRESENT,BOTH PMN AND MONONUCLEAR NO ORGANISMS SEEN CYTOSPIN SMEAR    Culture   Final    NO GROWTH 3 DAYS Performed at [facility], [address], [place], [place] [address]    Report Status 06/27/2024 FINAL  Final    Coagulation Studies: No results for input(s): LABPROT, INR in the last 72 hours.  Urinalysis: No results for input(s): COLORURINE, LABSPEC, PHURINE, GLUCOSEU, HGBUR, BILIRUBINUR, KETONESUR, PROTEINUR, UROBILINOGEN, NITRITE, LEUKOCYTESUR in the last 72 hours.  Invalid input(s): APPERANCEUR    Imaging: DG Chest Portable 1 View Result Date: 08/03/2024 CLINICAL DATA:  Shortness of breath. EXAM: PORTABLE CHEST 1 VIEW COMPARISON:  Chest radiograph dated 06/28/2024. FINDINGS: Right-sided dialysis catheter with tip at the cavoatrial junction. Bilateral pleural effusions for left greater and right  and similar to prior radiograph. No pneumothorax. Stable cardiac silhouette no acute osseous pathology. Spinal fusion hardware. IMPRESSION: Bilateral pleural effusions, left greater than right. Overall no significant interval change since the prior radiograph. Electronically Signed   By: Vanetta Chou M.D.   On: 08/03/2024 13:42     Medications:     bisacodyl   10 mg Oral QHS   carvedilol   6.25 mg Oral BID WC   Chlorhexidine  Gluconate Cloth  6 each Topical Q0600   cyanocobalamin   1,000 mcg Oral Daily   diltiazem   240 mg Oral QPM   doxepin   10 mg Oral QHS   famotidine   10 mg Oral Daily   furosemide   80 mg Oral Daily   gabapentin   300 mg Oral QHS   heparin   5,000 Units Subcutaneous Q8H   insulin  aspart  0-9 Units Subcutaneous TID WC   levothyroxine   175 mcg Oral QHS   linaclotide   145 mcg Oral QAC breakfast   loratadine   10 mg Oral Daily   sodium chloride  flush  3 mL Intravenous Q12H   Vitamin D  (Ergocalciferol )  50,000 Units Oral Q7 days   acetaminophen  **OR** acetaminophen , ondansetron  (ZOFRAN ) IV, oxyCODONE , senna-docusate  Assessment/ Plan:  Mr. Glenville Espina is a 62 y.o.  male with past medical conditions including diabetes, bilateral BKA, suprapubic catheter, hepatitis C, and end-stage renal disease on hemodialysis.  Patient presented to the emergency department from nursing facility with shortness of breath and have been admitted for SOB (shortness of breath) [R06.02] ESRD needing dialysis (HCC) [N18.6, Z99.2]  [facility]/MWF/right PermCath   End stage renal disease on hemodialysis.  Patient received about 30 minutes of treatment today.  Multiple access issues outpatient have prevented consistent treatments.   Patient received dialysis yesterday. Will perform dialysis today to maintain outpatient schedule. Will attempt UF 1.5L as tolerated. Next treatment scheduled for Friday. Acces functioning well during treatment.   2. Anemia of chronic kidney disease Lab  Results  Component Value Date   HGB 10.3 (L) 08/05/2024  Hemoglobin at goal.  No need for ESA's.  3. Secondary Hyperparathyroidism: with outpatient labs: None available Lab Results  Component Value Date   PTH 177 (H) 04/23/2024   CALCIUM  8.0 (L) 08/05/2024   PHOS 4.1 08/05/2024    Bone minerals stable, will continue to monitor.   4.  Hypertension with chronic kidney disease.  Currently receiving carvedilol , diltiazem , and furosemide . Blood pressure stable during dialysis, 122/84   LOS: 2 Anne [place] 11/12/202510:46 AM

## 2024-08-05 NOTE — Discharge Summary (Signed)
 Physician Discharge Summary  Eugene [name] FMW:[id number] DOB: 03/18/1961 DOA: 08/03/2024  PCP: [facility], [facility]  Admit date: 08/03/2024 Discharge date: 08/05/2024  Admitted From: LTC Disposition:  LTC ([facility])  Recommendations for Outpatient Follow-up:  Follow up with PCP in 1-2 weeks Follow up nephrology as directed  Home Health:No  Equipment/Devices:Oxygen 6L via [place]   Discharge Condition: Stable CODE STATUS:FULL  Diet recommendation: Heart healthy  Brief/Interim Summary:  63 year old male with PMHx of ESRD on HD MWF, chronic hypoxic respiratory failure on 6L [place], b/l BKA, HFpEF, T2DM, HTN, HLD, hypothyroidism, chronic pain syndrome, obesity,  who presented to [facility] ED on 08/03/2024 from his HD session after developing shortness of breath and hypotension while getting dialysis. There was also concern about his Port-A-Cath malfunctioning, which he had placed on 07/21/2024.  In the ED, he was afebrile, hemodynamically stable, SpO2 95% on home 6 L nasal cannula.  CBC showed chronic anemia, BMP consistent with ESRD status, Mg 2.1, hs Troponins unremarkable.  CXR showed bilateral pleural effusions, left greater than right, stable since prior imaging at the beginning of October.     Discharge Diagnoses:  Principal Problem:   ESRD needing dialysis (HCC) Active Problems:   Type 2 diabetes mellitus with hyperglycemia, with long-term current use of insulin  (HCC)   HTN (hypertension)   S/P bilateral BKA (below knee amputation) (HCC)   # ESRD on HD # Dialysis access malfunction The etiology of his dialysis access issues is somewhat unclear.  Per patient report he had several issues with dialysis access as outpatient.  Here in the hospital he has been dialyzing via his chest PermCath without issue.  Completed a 30-minute session on 11/11 and then a full session on 11/12.  He is stable for discharge and can return to [facility].  Resume home dialysis schedule.   Follow-up as directed with outpatient nephrology.    # Anemia of CKD - Hgb stable   # Chronic hypoxic respiratory failure - Stable on home 6L [place]   # Bilateral pleural effusions  L > R - Imaging showed stable b/l pleural effusions - Respiratory status at baseline    # Hypertension - Continue PTA Coreg , Cardizem , and Lasix    # Hyperlipidemia - Continue PTA   # Hypothyroidism - Continue home levothyroxine  175 mcg  #HFpEF - Continue home Lasix  80 mg daily   # Chronic pain - Continue home Oxycodone  10 mg q6h  Discharge Instructions  Discharge Instructions     Diet - low sodium heart healthy   Complete by: As directed    Increase activity slowly   Complete by: As directed    No wound care   Complete by: As directed       Allergies as of 08/05/2024   No Known Allergies      Medication List     TAKE these medications    acetaminophen  325 MG tablet Commonly known as: TYLENOL  Take 650 mg by mouth in the morning and at bedtime.   bisacodyl  10 MG suppository Commonly known as: DULCOLAX Place 10 mg rectally daily as needed for moderate constipation.   bisacodyl  5 MG EC tablet Commonly known as: DULCOLAX Take 2 tablets (10 mg total) by mouth at bedtime.   carvedilol  6.25 MG tablet Commonly known as: COREG  Take 1 tablet (6.25 mg total) by mouth 2 (two) times daily with a meal.   cetirizine 10 MG tablet Commonly known as: ZYRTEC Take 10 mg by mouth daily.   cyanocobalamin  1000  MCG tablet Take 1 tablet (1,000 mcg total) by mouth daily.   diltiazem  240 MG 24 hr capsule Commonly known as: CARDIZEM  CD Take 1 capsule (240 mg total) by mouth every evening.   Doxepin  HCl 6 MG Tabs Take 6 mg by mouth at bedtime.   famotidine  10 MG tablet Commonly known as: PEPCID  Take 1 tablet (10 mg total) by mouth daily.   folic acid  1 MG tablet Commonly known as: FOLVITE  Take 1 tablet (1 mg total) by mouth daily.   furosemide  80 MG tablet Commonly known as:  LASIX  Take 1 tablet (80 mg total) by mouth daily.   gabapentin  300 MG capsule Commonly known as: NEURONTIN  Take 300 mg by mouth at bedtime.   guaifenesin 100 MG/5ML syrup Commonly known as: ROBITUSSIN Take 200 mg by mouth 3 (three) times daily as needed for cough.   insulin  glargine-yfgn 100 UNIT/ML injection Commonly known as: SEMGLEE  Inject 0.1 mLs (10 Units total) into the skin 2 (two) times daily.   insulin  lispro 100 UNIT/ML injection Commonly known as: HUMALOG  Inject 0-15 Units into the skin 4 (four) times daily -  before meals and at bedtime. <150= 0 units 151-200= 3 units 201-250= 6 units 251-300= 9 units 301-350= 12 units 351-400= 15 units >400 call MD   ipratropium-albuterol  0.5-2.5 (3) MG/3ML Soln Commonly known as: DUONEB Take 3 mLs by nebulization every 6 (six) hours as needed.   Lactulose  20 GM/30ML Soln Take 30 mLs by mouth 2 (two) times daily.   levothyroxine  175 MCG tablet Commonly known as: SYNTHROID  Take 175 mcg by mouth at bedtime.   linaclotide  145 MCG Caps capsule Commonly known as: LINZESS  Take 1 capsule (145 mcg total) by mouth daily before breakfast.   liver oil-zinc oxide 40 % ointment Commonly known as: DESITIN Apply 1 Application topically as needed for irritation.   melatonin 5 MG Tabs Take 10 mg by mouth at bedtime.   metoCLOPramide  10 MG tablet Commonly known as: REGLAN  Take 0.5 tablets (5 mg total) by mouth every 8 (eight) hours as needed for nausea or vomiting.   midodrine  5 MG tablet Commonly known as: PROAMATINE  Take 5 mg by mouth 3 (three) times daily with meals as needed.   ondansetron  4 MG tablet Commonly known as: ZOFRAN  Take 4 mg by mouth every 8 (eight) hours as needed for nausea or vomiting.   oxyCODONE  15 MG immediate release tablet Commonly known as: ROXICODONE  Take 7.5 mg by mouth every 6 (six) hours as needed.   polyethylene glycol 17 g packet Commonly known as: MIRALAX  / GLYCOLAX  Take 17 g by mouth 2 (two)  times daily.   senna-docusate 8.6-50 MG tablet Commonly known as: Senokot-S Take 1 tablet by mouth 2 (two) times daily.   simethicone  80 MG chewable tablet Commonly known as: MYLICON Chew 1 tablet (80 mg total) by mouth 4 (four) times daily.   sitaGLIPtin 25 MG tablet Commonly known as: JANUVIA Take 25 mg by mouth daily.   Vitamin D  (Ergocalciferol ) 1.25 MG (50000 UNIT) Caps capsule Commonly known as: DRISDOL  Take 1 capsule (50,000 Units total) by mouth every 7 (seven) days.        No Known Allergies  Consultations: Nephrology   Procedures/Studies: DG Chest Portable 1 View Result Date: 08/03/2024 CLINICAL DATA:  Shortness of breath. EXAM: PORTABLE CHEST 1 VIEW COMPARISON:  Chest radiograph dated 06/28/2024. FINDINGS: Right-sided dialysis catheter with tip at the cavoatrial junction. Bilateral pleural effusions for left greater and right and similar to prior radiograph.  No pneumothorax. Stable cardiac silhouette no acute osseous pathology. Spinal fusion hardware. IMPRESSION: Bilateral pleural effusions, left greater than right. Overall no significant interval change since the prior radiograph. Electronically Signed   By: Vanetta Chou M.D.   On: 08/03/2024 13:42   PERIPHERAL VASCULAR CATHETERIZATION Result Date: 07/21/2024 See surgical note for result.     Subjective: Seen and examined during HD on the day of discharge.  Stable no distress.  Appropriate for return [facility].  Discharge Exam: Vitals:   08/05/24 1000 08/05/24 1030  BP: 132/85 127/76  Pulse: 75 76  Resp: 14 15  Temp:    SpO2: 98% 98%   Vitals:   08/05/24 0900 08/05/24 0930 08/05/24 1000 08/05/24 1030  BP: (!) 176/81 (!) 148/88 132/85 127/76  Pulse: 79 75 75 76  Resp: 16 11 14 15   Temp:      TempSrc:      SpO2: 100% 98% 98% 98%  Weight:      Height:        General: Pt is alert, awake, not in acute distress Cardiovascular: RRR, S1/S2 +, no rubs, no gallops Respiratory: CTA  bilaterally, no wheezing, no rhonchi Abdominal: Soft, NT, ND, bowel sounds + Extremities: no edema, no cyanosis    The results of significant diagnostics from this hospitalization (including imaging, microbiology, ancillary and laboratory) are listed below for reference.     Microbiology: No results found for this or any previous visit (from the past 240 hours).   Labs: BNP (last 3 results) Recent Labs    05/11/24 2240 05/21/24 2323 06/22/24 1553  BNP 188.4* 456.6* 669.6*   Basic Metabolic Panel: Recent Labs  Lab 08/03/24 1359 08/03/24 1752 08/05/24 0900  NA 138  --  137  K 4.9  --  4.6  CL 104  --  103  CO2 23  --  27  GLUCOSE 241*  --  192*  BUN 50*  --  32*  CREATININE 4.14*  --  3.46*  CALCIUM  7.8*  --  8.0*  MG  --  2.1  --   PHOS  --   --  4.1   Liver Function Tests: Recent Labs  Lab 08/05/24 0900  ALBUMIN  2.6*   No results for input(s): LIPASE, AMYLASE in the last 168 hours. No results for input(s): AMMONIA in the last 168 hours. CBC: Recent Labs  Lab 08/03/24 1359 08/05/24 0900  WBC 7.4 5.8  NEUTROABS 5.3  --   HGB 10.9* 10.3*  HCT 34.5* 33.2*  MCV 92.7 94.1  PLT 183 174   Cardiac Enzymes: No results for input(s): CKTOTAL, CKMB, CKMBINDEX, TROPONINI in the last 168 hours. BNP: Invalid input(s): POCBNP CBG: Recent Labs  Lab 08/04/24 0759 08/04/24 1832 08/04/24 2011  GLUCAP 151* 176* 200*   D-Dimer No results for input(s): DDIMER in the last 72 hours. Hgb A1c No results for input(s): HGBA1C in the last 72 hours. Lipid Profile No results for input(s): CHOL, HDL, LDLCALC, TRIG, CHOLHDL, LDLDIRECT in the last 72 hours. Thyroid function studies No results for input(s): TSH, T4TOTAL, T3FREE, THYROIDAB in the last 72 hours.  Invalid input(s): FREET3 Anemia work up No results for input(s): VITAMINB12, FOLATE, FERRITIN, TIBC, IRON , RETICCTPCT in the last 72 hours. Urinalysis     Component Value Date/Time   COLORURINE YELLOW (A) 06/22/2024 1600   APPEARANCEUR TURBID (A) 06/22/2024 1600   LABSPEC 1.027 06/22/2024 1600   PHURINE 6.0 06/22/2024 1600   GLUCOSEU 50 (A) 06/22/2024 1600   HGBUR  NEGATIVE 06/22/2024 1600   BILIRUBINUR NEGATIVE 06/22/2024 1600   KETONESUR NEGATIVE 06/22/2024 1600   PROTEINUR 100 (A) 06/22/2024 1600   NITRITE NEGATIVE 06/22/2024 1600   LEUKOCYTESUR MODERATE (A) 06/22/2024 1600   Sepsis Labs Recent Labs  Lab 08/03/24 1359 08/05/24 0900  WBC 7.4 5.8   Microbiology No results found for this or any previous visit (from the past 240 hours).   Time coordinating discharge: 40 minutes  SIGNED:   Calvin KATHEE Robson, MD  [facility] 08/05/2024, 11:41 AM Pager   If 7PM-7AM, please contact night-coverage

## 2024-08-05 NOTE — Progress Notes (Signed)
 This nurse Attempted to call [facility] for patient to return today. Discharge paperwork in his packet.

## 2024-08-05 NOTE — Progress Notes (Signed)
 D/C order noted. Contacted [facility] on MWF 11:45am advised of pt and d/c.           Requested documents faxed to clinic for continuation of care.  Suzen Satchel Dialysis Navigator [phone]

## 2024-08-05 NOTE — TOC Transition Note (Signed)
 Transition of Care [facility]) - Discharge Note   Patient Details  Name: Eugene [name] MRN: [id number] Date of Birth: Jan 27, 1961  Transition of Care [facility]) CM/SW Contact:  Alfonso Rummer, LCSW Phone Number: 08/05/2024, 1:08 PM   Clinical Narrative:    Pt is LTC resident with [facility]. Pt will discharge via [facility]. LCSW A. Rummer called pt brother to advise to discharge unable to leave message due to mailbox is full. No further toc needs.    Final next level of care: Skilled Nursing Facility Barriers to Discharge: Barriers Resolved   Patient Goals and CMS Choice            Discharge Placement              Patient chooses bed at: [facility] Patient to be transferred to facility by: [facility] Name of family member notified: Mizael Sagar  [phone] Patient and family notified of of transfer: 08/05/24  Discharge Plan and Services Additional resources added to the After Visit Summary for                                       Social Drivers of Health (SDOH) Interventions SDOH Screenings   Food Insecurity: No Food Insecurity (08/04/2024)  Housing: Low Risk  (08/04/2024)  Transportation Needs: No Transportation Needs (08/04/2024)  Utilities: Not At Risk (08/04/2024)  Financial Resource Strain: Medium Risk (02/02/2019)  Physical Activity: Unknown (02/02/2019)  Social Connections: Moderately Isolated (08/04/2024)  Stress: Stress Concern Present (02/02/2019)  Tobacco Use: High Risk (08/03/2024)     Readmission Risk Interventions    05/05/2024    3:05 PM  Readmission Risk Prevention Plan  Transportation Screening Complete  Medication Review (RN Care Manager) Complete  PCP or Specialist appointment within 3-5 days of discharge Complete  SW Recovery Care/Counseling Consult Complete  Palliative Care Screening Not Applicable  Skilled Nursing Facility Complete

## 2024-08-05 NOTE — Plan of Care (Signed)

## 2024-08-05 NOTE — Plan of Care (Signed)

## 2024-09-29 ENCOUNTER — Observation Stay

## 2024-09-29 ENCOUNTER — Other Ambulatory Visit: Payer: Self-pay

## 2024-09-29 ENCOUNTER — Emergency Department

## 2024-09-29 ENCOUNTER — Inpatient Hospital Stay
Admission: EM | Admit: 2024-09-29 | Discharge: 2024-10-15 | DRG: 252 | Disposition: A | Discharge status: Skilled Nursing Facility | Source: Skilled Nursing Facility | Attending: Internal Medicine | Admitting: Internal Medicine

## 2024-09-29 DIAGNOSIS — Z604 Social exclusion and rejection: Secondary | ICD-10-CM | POA: Diagnosis present

## 2024-09-29 DIAGNOSIS — R6510 Systemic inflammatory response syndrome (SIRS) of non-infectious origin without acute organ dysfunction: Principal | ICD-10-CM | POA: Diagnosis present

## 2024-09-29 DIAGNOSIS — Y840 Cardiac catheterization as the cause of abnormal reaction of the patient, or of later complication, without mention of misadventure at the time of the procedure: Secondary | ICD-10-CM | POA: Diagnosis present

## 2024-09-29 DIAGNOSIS — B964 Proteus (mirabilis) (morganii) as the cause of diseases classified elsewhere: Secondary | ICD-10-CM | POA: Diagnosis present

## 2024-09-29 DIAGNOSIS — D6959 Other secondary thrombocytopenia: Secondary | ICD-10-CM | POA: Diagnosis present

## 2024-09-29 DIAGNOSIS — I471 Supraventricular tachycardia, unspecified: Secondary | ICD-10-CM | POA: Diagnosis not present

## 2024-09-29 DIAGNOSIS — F17290 Nicotine dependence, other tobacco product, uncomplicated: Secondary | ICD-10-CM | POA: Diagnosis present

## 2024-09-29 DIAGNOSIS — N2581 Secondary hyperparathyroidism of renal origin: Secondary | ICD-10-CM | POA: Diagnosis present

## 2024-09-29 DIAGNOSIS — E669 Obesity, unspecified: Secondary | ICD-10-CM

## 2024-09-29 DIAGNOSIS — Z9981 Dependence on supplemental oxygen: Secondary | ICD-10-CM

## 2024-09-29 DIAGNOSIS — Z794 Long term (current) use of insulin: Secondary | ICD-10-CM

## 2024-09-29 DIAGNOSIS — R7881 Bacteremia: Secondary | ICD-10-CM

## 2024-09-29 DIAGNOSIS — E559 Vitamin D deficiency, unspecified: Secondary | ICD-10-CM | POA: Diagnosis present

## 2024-09-29 DIAGNOSIS — I132 Hypertensive heart and chronic kidney disease with heart failure and with stage 5 chronic kidney disease, or end stage renal disease: Secondary | ICD-10-CM | POA: Diagnosis present

## 2024-09-29 DIAGNOSIS — A4102 Sepsis due to Methicillin resistant Staphylococcus aureus: Secondary | ICD-10-CM | POA: Diagnosis present

## 2024-09-29 DIAGNOSIS — G894 Chronic pain syndrome: Secondary | ICD-10-CM | POA: Diagnosis not present

## 2024-09-29 DIAGNOSIS — J9621 Acute and chronic respiratory failure with hypoxia: Secondary | ICD-10-CM | POA: Diagnosis not present

## 2024-09-29 DIAGNOSIS — I5030 Unspecified diastolic (congestive) heart failure: Secondary | ICD-10-CM | POA: Diagnosis not present

## 2024-09-29 DIAGNOSIS — L89312 Pressure ulcer of right buttock, stage 2: Secondary | ICD-10-CM | POA: Diagnosis present

## 2024-09-29 DIAGNOSIS — R109 Unspecified abdominal pain: Secondary | ICD-10-CM | POA: Diagnosis not present

## 2024-09-29 DIAGNOSIS — E039 Hypothyroidism, unspecified: Secondary | ICD-10-CM | POA: Diagnosis not present

## 2024-09-29 DIAGNOSIS — E785 Hyperlipidemia, unspecified: Secondary | ICD-10-CM | POA: Diagnosis present

## 2024-09-29 DIAGNOSIS — Z89511 Acquired absence of right leg below knee: Secondary | ICD-10-CM

## 2024-09-29 DIAGNOSIS — Z79890 Hormone replacement therapy (postmenopausal): Secondary | ICD-10-CM

## 2024-09-29 DIAGNOSIS — T80211A Bloodstream infection due to central venous catheter, initial encounter: Principal | ICD-10-CM | POA: Diagnosis present

## 2024-09-29 DIAGNOSIS — F32A Depression, unspecified: Secondary | ICD-10-CM | POA: Diagnosis present

## 2024-09-29 DIAGNOSIS — K5909 Other constipation: Secondary | ICD-10-CM

## 2024-09-29 DIAGNOSIS — K567 Ileus, unspecified: Secondary | ICD-10-CM | POA: Diagnosis not present

## 2024-09-29 DIAGNOSIS — J90 Pleural effusion, not elsewhere classified: Secondary | ICD-10-CM | POA: Diagnosis not present

## 2024-09-29 DIAGNOSIS — Z89512 Acquired absence of left leg below knee: Secondary | ICD-10-CM

## 2024-09-29 DIAGNOSIS — F419 Anxiety disorder, unspecified: Secondary | ICD-10-CM | POA: Diagnosis present

## 2024-09-29 DIAGNOSIS — N390 Urinary tract infection, site not specified: Secondary | ICD-10-CM

## 2024-09-29 DIAGNOSIS — J449 Chronic obstructive pulmonary disease, unspecified: Secondary | ICD-10-CM | POA: Diagnosis present

## 2024-09-29 DIAGNOSIS — N186 End stage renal disease: Secondary | ICD-10-CM | POA: Diagnosis present

## 2024-09-29 DIAGNOSIS — G8929 Other chronic pain: Secondary | ICD-10-CM | POA: Diagnosis present

## 2024-09-29 DIAGNOSIS — E66812 Obesity, class 2: Secondary | ICD-10-CM | POA: Diagnosis present

## 2024-09-29 DIAGNOSIS — J9811 Atelectasis: Secondary | ICD-10-CM | POA: Diagnosis not present

## 2024-09-29 DIAGNOSIS — E1122 Type 2 diabetes mellitus with diabetic chronic kidney disease: Secondary | ICD-10-CM | POA: Diagnosis present

## 2024-09-29 DIAGNOSIS — Z981 Arthrodesis status: Secondary | ICD-10-CM

## 2024-09-29 DIAGNOSIS — N319 Neuromuscular dysfunction of bladder, unspecified: Secondary | ICD-10-CM | POA: Diagnosis present

## 2024-09-29 DIAGNOSIS — K5289 Other specified noninfective gastroenteritis and colitis: Secondary | ICD-10-CM | POA: Diagnosis present

## 2024-09-29 DIAGNOSIS — Z7984 Long term (current) use of oral hypoglycemic drugs: Secondary | ICD-10-CM

## 2024-09-29 DIAGNOSIS — Z992 Dependence on renal dialysis: Secondary | ICD-10-CM

## 2024-09-29 DIAGNOSIS — E1165 Type 2 diabetes mellitus with hyperglycemia: Secondary | ICD-10-CM | POA: Diagnosis present

## 2024-09-29 DIAGNOSIS — Z6841 Body Mass Index (BMI) 40.0 and over, adult: Secondary | ICD-10-CM

## 2024-09-29 DIAGNOSIS — Z1152 Encounter for screening for COVID-19: Secondary | ICD-10-CM

## 2024-09-29 DIAGNOSIS — D631 Anemia in chronic kidney disease: Secondary | ICD-10-CM | POA: Diagnosis present

## 2024-09-29 DIAGNOSIS — G4733 Obstructive sleep apnea (adult) (pediatric): Secondary | ICD-10-CM | POA: Diagnosis present

## 2024-09-29 DIAGNOSIS — J9611 Chronic respiratory failure with hypoxia: Secondary | ICD-10-CM

## 2024-09-29 DIAGNOSIS — Z79899 Other long term (current) drug therapy: Secondary | ICD-10-CM

## 2024-09-29 DIAGNOSIS — A419 Sepsis, unspecified organism: Principal | ICD-10-CM

## 2024-09-29 DIAGNOSIS — Z9889 Other specified postprocedural states: Secondary | ICD-10-CM

## 2024-09-29 DIAGNOSIS — Z8739 Personal history of other diseases of the musculoskeletal system and connective tissue: Secondary | ICD-10-CM

## 2024-09-29 DIAGNOSIS — R8271 Bacteriuria: Secondary | ICD-10-CM | POA: Diagnosis present

## 2024-09-29 DIAGNOSIS — Z960 Presence of urogenital implants: Secondary | ICD-10-CM | POA: Diagnosis present

## 2024-09-29 DIAGNOSIS — Z8419 Family history of other disorders of kidney and ureter: Secondary | ICD-10-CM

## 2024-09-29 DIAGNOSIS — B1920 Unspecified viral hepatitis C without hepatic coma: Secondary | ICD-10-CM | POA: Diagnosis present

## 2024-09-29 HISTORY — DX: End stage renal disease: N18.6

## 2024-09-29 LAB — CBC WITH DIFFERENTIAL/PLATELET
Abs Immature Granulocytes: 0.48 K/uL — ABNORMAL HIGH (ref 0.00–0.07)
Basophils Absolute: 0.1 K/uL (ref 0.0–0.1)
Basophils Relative: 0 %
Eosinophils Absolute: 0 K/uL (ref 0.0–0.5)
Eosinophils Relative: 0 %
HCT: 36.1 % — ABNORMAL LOW (ref 39.0–52.0)
Hemoglobin: 11.5 g/dL — ABNORMAL LOW (ref 13.0–17.0)
Immature Granulocytes: 2 %
Lymphocytes Relative: 3 %
Lymphs Abs: 0.7 K/uL (ref 0.7–4.0)
MCH: 29.9 pg (ref 26.0–34.0)
MCHC: 31.9 g/dL (ref 30.0–36.0)
MCV: 94 fL (ref 80.0–100.0)
Monocytes Absolute: 1.9 K/uL — ABNORMAL HIGH (ref 0.1–1.0)
Monocytes Relative: 7 %
Neutro Abs: 23.2 K/uL — ABNORMAL HIGH (ref 1.7–7.7)
Neutrophils Relative %: 88 %
Platelets: 139 K/uL — ABNORMAL LOW (ref 150–400)
RBC: 3.84 MIL/uL — ABNORMAL LOW (ref 4.22–5.81)
RDW: 14.6 % (ref 11.5–15.5)
Smear Review: NORMAL
WBC: 26.4 K/uL — ABNORMAL HIGH (ref 4.0–10.5)
nRBC: 0 % (ref 0.0–0.2)

## 2024-09-29 LAB — COMPREHENSIVE METABOLIC PANEL WITH GFR
ALT: 28 U/L (ref 0–44)
AST: 21 U/L (ref 15–41)
Albumin: 3.2 g/dL — ABNORMAL LOW (ref 3.5–5.0)
Alkaline Phosphatase: 83 U/L (ref 38–126)
Anion gap: 14 (ref 5–15)
BUN: 60 mg/dL — ABNORMAL HIGH (ref 8–23)
CO2: 24 mmol/L (ref 22–32)
Calcium: 8.3 mg/dL — ABNORMAL LOW (ref 8.9–10.3)
Chloride: 102 mmol/L (ref 98–111)
Creatinine, Ser: 4.37 mg/dL — ABNORMAL HIGH (ref 0.61–1.24)
GFR, Estimated: 15 mL/min — ABNORMAL LOW
Glucose, Bld: 256 mg/dL — ABNORMAL HIGH (ref 70–99)
Potassium: 4.3 mmol/L (ref 3.5–5.1)
Sodium: 139 mmol/L (ref 135–145)
Total Bilirubin: 0.4 mg/dL (ref 0.0–1.2)
Total Protein: 6.5 g/dL (ref 6.5–8.1)

## 2024-09-29 LAB — URINALYSIS, W/ REFLEX TO CULTURE (INFECTION SUSPECTED)
Bilirubin Urine: NEGATIVE
Glucose, UA: >=500 mg/dL — AB
Hgb urine dipstick: NEGATIVE
Ketones, ur: NEGATIVE mg/dL
Nitrite: NEGATIVE
Protein, ur: >=300 mg/dL — AB
Specific Gravity, Urine: 1.024 (ref 1.005–1.030)
WBC, UA: >50 WBC/hpf (ref 0–5)
pH: 8 (ref 5.0–8.0)

## 2024-09-29 LAB — LACTIC ACID, PLASMA
Lactic Acid, Venous: 1.7 mmol/L (ref 0.5–1.9)
Lactic Acid, Venous: 1.5 mmol/L (ref 0.5–1.9)

## 2024-09-29 LAB — CBG MONITORING, ED: Glucose-Capillary: 288 mg/dL — ABNORMAL HIGH (ref 70–99)

## 2024-09-29 LAB — GLUCOSE, CAPILLARY: Glucose-Capillary: 295 mg/dL — ABNORMAL HIGH (ref 70–99)

## 2024-09-29 LAB — PROTIME-INR
INR: 1.1 (ref 0.8–1.2)
Prothrombin Time: 14.4 s (ref 11.4–15.2)

## 2024-09-29 LAB — RESP PANEL BY RT-PCR (RSV, FLU A&B, COVID)  RVPGX2
Influenza A by PCR: NEGATIVE
Influenza B by PCR: NEGATIVE
Resp Syncytial Virus by PCR: NEGATIVE
SARS Coronavirus 2 by RT PCR: NEGATIVE

## 2024-09-29 LAB — BLOOD GAS, VENOUS
Acid-base deficit: 1.1 mmol/L (ref 0.0–2.0)
Bicarbonate: 24.3 mmol/L (ref 20.0–28.0)
O2 Saturation: 88.9 %
Patient temperature: 37
pCO2, Ven: 42 mmHg — ABNORMAL LOW (ref 44–60)
pH, Ven: 7.37 (ref 7.25–7.43)
pO2, Ven: 57 mmHg — ABNORMAL HIGH (ref 32–45)

## 2024-09-29 MED ORDER — INSULIN ASPART 100 UNIT/ML IJ SOLN
0.0000 [IU] | Freq: Three times a day (TID) | INTRAMUSCULAR | Status: DC
  Administered 2024-09-29 – 2024-09-30 (×2): 3 [IU] via SUBCUTANEOUS
  Administered 2024-09-30: 4 [IU] via SUBCUTANEOUS
  Administered 2024-09-30: 3 [IU] via SUBCUTANEOUS
  Administered 2024-10-01 – 2024-10-02 (×4): 2 [IU] via SUBCUTANEOUS
  Administered 2024-10-02: 1 [IU] via SUBCUTANEOUS
  Administered 2024-10-02: 2 [IU] via SUBCUTANEOUS
  Administered 2024-10-03 – 2024-10-04 (×4): 3 [IU] via SUBCUTANEOUS
  Administered 2024-10-04: 2 [IU] via SUBCUTANEOUS
  Administered 2024-10-04: 3 [IU] via SUBCUTANEOUS
  Administered 2024-10-05: 4 [IU] via SUBCUTANEOUS
  Administered 2024-10-05: 1 [IU] via SUBCUTANEOUS
  Administered 2024-10-06: 2 [IU] via SUBCUTANEOUS
  Administered 2024-10-06: 3 [IU] via SUBCUTANEOUS
  Administered 2024-10-06: 4 [IU] via SUBCUTANEOUS
  Administered 2024-10-07: 1 [IU] via SUBCUTANEOUS
  Administered 2024-10-07: 2 [IU] via SUBCUTANEOUS
  Administered 2024-10-07 – 2024-10-13 (×7): 1 [IU] via SUBCUTANEOUS
  Administered 2024-10-14: 2 [IU] via SUBCUTANEOUS
  Filled 2024-09-29: qty 1
  Filled 2024-09-29: qty 3
  Filled 2024-09-29: qty 1
  Filled 2024-09-29 (×3): qty 2
  Filled 2024-09-29: qty 3
  Filled 2024-09-29: qty 1
  Filled 2024-09-29 (×3): qty 3
  Filled 2024-09-29: qty 1
  Filled 2024-09-29: qty 2
  Filled 2024-09-29 (×3): qty 3
  Filled 2024-09-29: qty 2
  Filled 2024-09-29: qty 3
  Filled 2024-09-29: qty 2
  Filled 2024-09-29: qty 4
  Filled 2024-09-29: qty 3
  Filled 2024-09-29: qty 2
  Filled 2024-09-29: qty 1
  Filled 2024-09-29: qty 4
  Filled 2024-09-29 (×3): qty 1
  Filled 2024-09-29: qty 2
  Filled 2024-09-29: qty 4
  Filled 2024-09-29: qty 1

## 2024-09-29 MED ORDER — DILTIAZEM HCL ER COATED BEADS 240 MG PO CP24
240.0000 mg | ORAL_CAPSULE | Freq: Every evening | ORAL | Status: DC
  Administered 2024-09-29 – 2024-10-13 (×15): 240 mg via ORAL
  Filled 2024-09-29 (×18): qty 1

## 2024-09-29 MED ORDER — GABAPENTIN 300 MG PO CAPS
300.0000 mg | ORAL_CAPSULE | Freq: Every day | ORAL | Status: DC
  Administered 2024-09-30 – 2024-10-14 (×15): 300 mg via ORAL
  Filled 2024-09-29 (×16): qty 1

## 2024-09-29 MED ORDER — GUAIFENESIN 100 MG/5ML PO LIQD
200.0000 mg | Freq: Three times a day (TID) | ORAL | Status: DC | PRN
  Administered 2024-10-07 – 2024-10-08 (×3): 200 mg via ORAL
  Filled 2024-09-29 (×3): qty 10

## 2024-09-29 MED ORDER — MIDODRINE HCL 5 MG PO TABS
5.0000 mg | ORAL_TABLET | Freq: Three times a day (TID) | ORAL | Status: DC
  Administered 2024-09-29 – 2024-09-30 (×2): 5 mg via ORAL
  Filled 2024-09-29 (×3): qty 1

## 2024-09-29 MED ORDER — METRONIDAZOLE 500 MG/100ML IV SOLN
500.0000 mg | Freq: Once | INTRAVENOUS | Status: AC
  Administered 2024-09-29: 500 mg via INTRAVENOUS
  Filled 2024-09-29: qty 100

## 2024-09-29 MED ORDER — FAMOTIDINE 20 MG PO TABS
10.0000 mg | ORAL_TABLET | Freq: Every day | ORAL | Status: DC
  Administered 2024-09-30 – 2024-10-15 (×14): 10 mg via ORAL
  Filled 2024-09-29 (×14): qty 1

## 2024-09-29 MED ORDER — ONDANSETRON HCL 4 MG PO TABS
4.0000 mg | ORAL_TABLET | Freq: Four times a day (QID) | ORAL | Status: DC | PRN
  Administered 2024-10-07 – 2024-10-11 (×3): 4 mg via ORAL
  Filled 2024-09-29 (×4): qty 1

## 2024-09-29 MED ORDER — BISACODYL 10 MG RE SUPP
10.0000 mg | Freq: Every day | RECTAL | Status: DC | PRN
  Administered 2024-10-01 – 2024-10-10 (×2): 10 mg via RECTAL
  Filled 2024-09-29 (×2): qty 1

## 2024-09-29 MED ORDER — OXYCODONE HCL 5 MG PO TABS
7.5000 mg | ORAL_TABLET | Freq: Four times a day (QID) | ORAL | Status: DC | PRN
  Administered 2024-09-30 – 2024-10-14 (×18): 7.5 mg via ORAL
  Filled 2024-09-29 (×17): qty 2

## 2024-09-29 MED ORDER — VANCOMYCIN VARIABLE DOSE PER UNSTABLE RENAL FUNCTION (PHARMACIST DOSING): Status: AC

## 2024-09-29 MED ORDER — IOHEXOL 9 MG/ML PO SOLN
500.0000 mL | ORAL | Status: AC
  Administered 2024-09-29: 500 mL via ORAL

## 2024-09-29 MED ORDER — DOXEPIN HCL 10 MG PO CAPS
10.0000 mg | ORAL_CAPSULE | Freq: Every day | ORAL | Status: DC
  Administered 2024-09-30 – 2024-10-14 (×15): 10 mg via ORAL
  Filled 2024-09-29 (×17): qty 1

## 2024-09-29 MED ORDER — ACETAMINOPHEN 325 MG PO TABS
650.0000 mg | ORAL_TABLET | Freq: Four times a day (QID) | ORAL | Status: DC | PRN
  Administered 2024-10-03 – 2024-10-07 (×2): 650 mg via ORAL
  Filled 2024-09-29 (×2): qty 2

## 2024-09-29 MED ORDER — INSULIN ASPART 100 UNIT/ML IJ SOLN
0.0000 [IU] | Freq: Every day | INTRAMUSCULAR | Status: DC
  Administered 2024-09-30 – 2024-10-01 (×2): 3 [IU] via SUBCUTANEOUS
  Administered 2024-10-02: 2 [IU] via SUBCUTANEOUS
  Administered 2024-10-03: 4 [IU] via SUBCUTANEOUS
  Administered 2024-10-04: 3 [IU] via SUBCUTANEOUS
  Administered 2024-10-05 – 2024-10-07 (×2): 2 [IU] via SUBCUTANEOUS
  Filled 2024-09-29: qty 2
  Filled 2024-09-29: qty 3
  Filled 2024-09-29: qty 2
  Filled 2024-09-29 (×2): qty 3
  Filled 2024-09-29: qty 4

## 2024-09-29 MED ORDER — SENNOSIDES-DOCUSATE SODIUM 8.6-50 MG PO TABS
1.0000 | ORAL_TABLET | Freq: Two times a day (BID) | ORAL | Status: DC
  Administered 2024-09-29 – 2024-10-15 (×28): 1 via ORAL
  Filled 2024-09-29 (×29): qty 1

## 2024-09-29 MED ORDER — VITAMIN B-12 1000 MCG PO TABS
1000.0000 ug | ORAL_TABLET | Freq: Every day | ORAL | Status: DC
  Administered 2024-09-30 – 2024-10-15 (×14): 1000 ug via ORAL
  Filled 2024-09-29 (×14): qty 1

## 2024-09-29 MED ORDER — LEVOTHYROXINE SODIUM 50 MCG PO TABS
175.0000 ug | ORAL_TABLET | Freq: Every day | ORAL | Status: DC
  Administered 2024-09-29 – 2024-10-14 (×16): 175 ug via ORAL
  Filled 2024-09-29 (×16): qty 1

## 2024-09-29 MED ORDER — LACTATED RINGERS IV SOLN: INTRAVENOUS | Status: DC

## 2024-09-29 MED ORDER — INSULIN GLARGINE 100 UNIT/ML ~~LOC~~ SOLN
10.0000 [IU] | Freq: Every day | SUBCUTANEOUS | Status: DC
  Administered 2024-09-29 – 2024-09-30 (×2): 10 [IU] via SUBCUTANEOUS
  Filled 2024-09-29 (×3): qty 0.1

## 2024-09-29 MED ORDER — FUROSEMIDE 40 MG PO TABS: 80.0000 mg | ORAL_TABLET | Freq: Every day | ORAL | Status: DC

## 2024-09-29 MED ORDER — ZINC OXIDE 40 % EX OINT: 1.0000 | TOPICAL_OINTMENT | CUTANEOUS | Status: DC | PRN

## 2024-09-29 MED ORDER — SODIUM CHLORIDE 0.9 % IV SOLN
2.0000 g | Freq: Once | INTRAVENOUS | Status: AC
  Administered 2024-09-29: 2 g via INTRAVENOUS
  Filled 2024-09-29: qty 12.5

## 2024-09-29 MED ORDER — CHLORHEXIDINE GLUCONATE CLOTH 2 % EX PADS
6.0000 | MEDICATED_PAD | Freq: Every day | CUTANEOUS | Status: DC
  Administered 2024-09-30 – 2024-10-13 (×10): 6 via TOPICAL

## 2024-09-29 MED ORDER — LACTULOSE 10 GM/15ML PO SOLN
20.0000 g | Freq: Two times a day (BID) | ORAL | Status: DC
  Administered 2024-09-29 – 2024-10-02 (×5): 20 g via ORAL
  Filled 2024-09-29 (×6): qty 30

## 2024-09-29 MED ORDER — IPRATROPIUM-ALBUTEROL 0.5-2.5 (3) MG/3ML IN SOLN
3.0000 mL | Freq: Four times a day (QID) | RESPIRATORY_TRACT | Status: DC | PRN
  Administered 2024-09-30 – 2024-10-04 (×5): 3 mL via RESPIRATORY_TRACT
  Filled 2024-09-29 (×5): qty 3

## 2024-09-29 MED ORDER — CARVEDILOL 6.25 MG PO TABS
6.2500 mg | ORAL_TABLET | Freq: Two times a day (BID) | ORAL | Status: DC
  Administered 2024-09-30 – 2024-10-15 (×28): 6.25 mg via ORAL
  Filled 2024-09-29 (×28): qty 1

## 2024-09-29 MED ORDER — SODIUM CHLORIDE 0.9 % IV SOLN
2.0000 g | INTRAVENOUS | Status: DC
  Filled 2024-09-29: qty 12.5

## 2024-09-29 MED ORDER — FOLIC ACID 1 MG PO TABS
1.0000 mg | ORAL_TABLET | Freq: Every day | ORAL | Status: DC
  Administered 2024-09-30 – 2024-10-15 (×14): 1 mg via ORAL
  Filled 2024-09-29 (×14): qty 1

## 2024-09-29 MED ORDER — ONDANSETRON HCL 4 MG/2ML IJ SOLN
4.0000 mg | Freq: Four times a day (QID) | INTRAMUSCULAR | Status: DC | PRN
  Administered 2024-09-30 – 2024-10-14 (×24): 4 mg via INTRAVENOUS
  Filled 2024-09-29 (×28): qty 2

## 2024-09-29 MED ORDER — LORATADINE 10 MG PO TABS
10.0000 mg | ORAL_TABLET | Freq: Every day | ORAL | Status: DC
  Administered 2024-09-30 – 2024-10-15 (×14): 10 mg via ORAL
  Filled 2024-09-29 (×15): qty 1

## 2024-09-29 MED ORDER — ACETAMINOPHEN 650 MG RE SUPP: 650.0000 mg | Freq: Four times a day (QID) | RECTAL | Status: DC | PRN

## 2024-09-29 MED ORDER — VANCOMYCIN HCL IN DEXTROSE 1-5 GM/200ML-% IV SOLN
1000.0000 mg | Freq: Once | INTRAVENOUS | Status: DC
  Filled 2024-09-29: qty 200

## 2024-09-29 MED ORDER — FUROSEMIDE 10 MG/ML IJ SOLN
100.0000 mg | Freq: Once | INTRAVENOUS | Status: AC
  Administered 2024-09-30: 100 mg via INTRAVENOUS
  Filled 2024-09-29: qty 10

## 2024-09-29 MED ORDER — SIMETHICONE 80 MG PO CHEW
80.0000 mg | CHEWABLE_TABLET | Freq: Four times a day (QID) | ORAL | Status: DC
  Administered 2024-09-29 – 2024-10-15 (×54): 80 mg via ORAL
  Filled 2024-09-29 (×58): qty 1

## 2024-09-29 MED ORDER — VANCOMYCIN HCL 2000 MG/400ML IV SOLN
2000.0000 mg | Freq: Once | INTRAVENOUS | Status: AC
  Administered 2024-09-29: 2000 mg via INTRAVENOUS
  Filled 2024-09-29 (×2): qty 400

## 2024-09-29 MED ORDER — POLYETHYLENE GLYCOL 3350 17 G PO PACK
17.0000 g | PACK | Freq: Two times a day (BID) | ORAL | Status: DC
  Administered 2024-09-29 – 2024-10-14 (×13): 17 g via ORAL
  Filled 2024-09-29 (×26): qty 1

## 2024-09-29 MED ORDER — LINACLOTIDE 145 MCG PO CAPS
145.0000 ug | ORAL_CAPSULE | Freq: Every day | ORAL | Status: DC
  Administered 2024-09-30 – 2024-10-15 (×15): 145 ug via ORAL
  Filled 2024-09-29 (×16): qty 1

## 2024-09-29 MED ORDER — BISACODYL 5 MG PO TBEC
10.0000 mg | DELAYED_RELEASE_TABLET | Freq: Every day | ORAL | Status: DC
  Administered 2024-09-30 – 2024-10-14 (×13): 10 mg via ORAL
  Filled 2024-09-29 (×15): qty 2

## 2024-09-29 MED ORDER — HEPARIN SODIUM (PORCINE) 5000 UNIT/ML IJ SOLN
5000.0000 [IU] | Freq: Three times a day (TID) | INTRAMUSCULAR | Status: DC
  Administered 2024-09-29 – 2024-10-14 (×32): 5000 [IU] via SUBCUTANEOUS
  Filled 2024-09-29 (×36): qty 1

## 2024-09-29 NOTE — Assessment & Plan Note (Signed)
 Dialysis to manage fluid and Lasix  in the evenings.

## 2024-09-29 NOTE — Assessment & Plan Note (Signed)
 Left greater than right.  Continue Lasix  in the evenings and dialysis.

## 2024-09-29 NOTE — Progress Notes (Signed)
 Elink is following code sepsis.

## 2024-09-29 NOTE — Assessment & Plan Note (Signed)
 On Cardizem  and Coreg 

## 2024-09-29 NOTE — Assessment & Plan Note (Signed)
 Patient on numerous medications.

## 2024-09-29 NOTE — Assessment & Plan Note (Signed)
-  On levothyroxine

## 2024-09-29 NOTE — ED Provider Notes (Signed)
 Consalted with and patient accepted admission to hospital service by Dr. Ginnie Dicky Anes, MD 09/29/24 [phone]

## 2024-09-29 NOTE — Consult Note (Signed)
 Pharmacy Antibiotic Note  Eugene [name] is a 64 y.o. male with a PMH of ESRD on HD MWF, HTN, DM, HLD and anxiety is admitted on 09/29/2024 with sepsis  and SOB. Pharmacy has been consulted for vancomycin  and cefepime  dosing.  Plan: Vancomycin  2, 000 mg LD x1 scheduled to start at 1700 Will follow vancomycin  variable dosing protocol and order random levels and schedule doses proceeding HD session while IP. Will follow nephrology to schedule doses and monitor cultures to guide antibiotic therapy.  Height: 5' 10 (177.8 cm) Weight: 119 kg (262 lb 5.6 oz) IBW/kg (Calculated) : 73  Temp (24hrs), Avg:100.4 F (38 C), Min:100.4 F (38 C), Max:100.4 F (38 C)  Recent Labs  Lab 09/29/24 1437  WBC 26.4*  CREATININE 4.37*  LATICACIDVEN 1.7    Estimated Creatinine Clearance: 22.9 mL/min (A) (by C-G formula based on SCr of 4.37 mg/dL (H)).    Allergies[1]  Antimicrobials this admission: 1/6 cefepime  >>  1/6 vancomycin  >>    Microbiology results: 1/6 BCx: pending 1/6 UCx: pending   Thank you for allowing pharmacy to be a part of this patients care.  Eugene [name], PharmD Clinical Pharmacist 09/29/2024 4:10 PM      [1] No Known Allergies

## 2024-09-29 NOTE — Progress Notes (Shared)
 Pt is refusing to take any medication, VSS, refusing to drink contrast, CT made aware and MD made aware.

## 2024-09-29 NOTE — ED Triage Notes (Signed)
 Pt to ED via [facility] from [facility]. Pt normally wears 6L [place] and staff at facility reported to EMS that he had been desatting in the 80s on baseline 6L. EMS reports they placed pt on NRB and then back on 6L [place]. EMS reports pt has been maintaining 94% on 6L [place]. EMS reports that he has not ate in 3 days and reports feeling sick.

## 2024-09-29 NOTE — Assessment & Plan Note (Signed)
 On 6L

## 2024-09-29 NOTE — ED Notes (Addendum)
 RN to bedside to round on pt. Pt's SPO2 88% on 6L [place]. Pt reports SOB. Pt's SPO2 then decreased to 86%. Pt placed on NRB. RT called to place pt on HFNC. MD made aware.

## 2024-09-29 NOTE — Assessment & Plan Note (Signed)
 Consulted nephrology for dialysis tomorrow

## 2024-09-29 NOTE — Assessment & Plan Note (Signed)
 On oxycodone 

## 2024-09-29 NOTE — Assessment & Plan Note (Signed)
 Will obtain abdominal x-ray flat and upright.  Continue medications for constipation.

## 2024-09-29 NOTE — Consult Note (Signed)
 CODE SEPSIS - PHARMACY COMMUNICATION  **Broad Spectrum Antibiotics should be administered within 1 hour of Sepsis diagnosis**  Time Code Sepsis Called/Page Received: 1416   Antibiotics Ordered: cefepime , metronidazole , vancomycin   Time of 1st antibiotic administration: 1453  Additional action taken by pharmacy: none  If necessary, Name of Provider/Nurse Contacted: n/a    Eugene [name] ,PharmD Clinical Pharmacist  09/29/2024  3:09 PM

## 2024-09-29 NOTE — Assessment & Plan Note (Signed)
 BMI 37.64

## 2024-09-29 NOTE — ED Provider Notes (Signed)
 "  [facility] Provider Note    Event Date/Time   First MD Initiated Contact with Patient 09/29/24 1412     (approximate)  History   Chief Complaint: Weakness, nausea  HPI  Eugene [name] is a 64 y.o. male with a past medical history of anxiety, diabetes, hypertension, hyperlipidemia, ESRD on HD Monday/Wednesday/Friday, presents to the emergency department for nausea weakness shortness of breath.  According to the patient for last 2 to 3 days he has had worsening shortness of breath he has been feeling very weak over the last 2 days and very nauseated today.  Patient went to dialysis yesterday received a full session.  Patient denies any obvious cough denies any known fever.  Patient has a chronic urinary catheter patient has bilateral lower extremity amputations.  Physical Exam   Triage Vital Signs: ED Triage Vitals  Encounter Vitals Group     BP --      Girls Systolic BP Percentile --      Girls Diastolic BP Percentile --      Boys Systolic BP Percentile --      Boys Diastolic BP Percentile --      Pulse --      Resp --      Temp 09/29/24 1414 (!) 100.4 F (38 C)     Temp Source 09/29/24 1414 Oral     SpO2 --      Weight 09/29/24 1412 262 lb 5.6 oz (119 kg)     Height 09/29/24 1412 5' 10 (1.778 m)     Head Circumference --      Peak Flow --      Pain Score 09/29/24 1412 8     Pain Loc --      Pain Education --      Exclude from Growth Chart --     Most recent vital signs: Vitals:   09/29/24 1414  Temp: (!) 100.4 F (38 C)    General: Awake, no distress.  CV:  Good peripheral perfusion.  Regular rate and rhythm  Resp:  Normal effort.  Equal breath sounds bilaterally.  Abd:  No distention.  Soft, mild diffuse tenderness.  No rebound or guarding. Other:  Chronic Foley catheter present with darker appearing urine output.   ED Results / Procedures / Treatments   EKG  EKG viewed and interpreted by myself shows sinus tachycardia 101 bpm  with a narrow QRS, normal axis, normal intervals, no concerning ST changes.  RADIOLOGY  I have reviewed interpret the chest x-ray images.  No significant consolidation seen on my evaluation.   MEDICATIONS ORDERED IN ED: Medications  lactated ringers  infusion (has no administration in time range)  ceFEPIme  (MAXIPIME ) 2 g in sodium chloride  0.9 % 100 mL IVPB (has no administration in time range)  metroNIDAZOLE  (FLAGYL ) IVPB 500 mg (has no administration in time range)  vancomycin  (VANCOCIN ) IVPB 1000 mg/200 mL premix (has no administration in time range)     IMPRESSION / MDM / ASSESSMENT AND PLAN / ED COURSE  I reviewed the triage vital signs and the nursing notes.  Patient's presentation is most consistent with acute presentation with potential threat to life or bodily function.  Patient presents to the emergency department for generalized weakness nausea shortness of breath.  Patient found to be febrile to 100.4.  Given the patient's comorbidities we will cover broad-spectrum antibiotics we will send labs, cultures, lactic acid obtain a chest x-ray, COVID/flu swab, urine sample.  Will continue to close  monitor while awaiting results.  Will hold off on IV fluid bolus given the patient's ESRD status.  Patient is labwork is resulted showing leukocytosis of 26,000.  Urinalysis concerning for urinary tract infection.  Chemistry shows no significant finding, chronic kidney disease.  CRITICAL CARE Performed by: Franky Moores   Total critical care time: 30 minutes  Critical care time was exclusive of separately billable procedures and treating other patients.  Critical care was necessary to treat or prevent imminent or life-threatening deterioration.  Critical care was time spent personally by me on the following activities: development of treatment plan with patient and/or surrogate as well as nursing, discussions with consultants, evaluation of patient's response to treatment,  examination of patient, obtaining history from patient or surrogate, ordering and performing treatments and interventions, ordering and review of laboratory studies, ordering and review of radiographic studies, pulse oximetry and re-evaluation of patient's condition.   FINAL CLINICAL IMPRESSION(S) / ED DIAGNOSES   Sepsis Weakness Urinary tract infection  Note:  This document was prepared using Dragon voice recognition software and may include unintentional dictation errors.   Moores Franky, MD 10/01/24 2136  "

## 2024-09-29 NOTE — Assessment & Plan Note (Signed)
 Continue Lantus  and sliding scale

## 2024-09-29 NOTE — H&P (Signed)
 " History and Physical    Patient: Eugene [name] FMW:[id number] DOB: [birth year] DOA: 09/29/2024 DOS: the patient was seen and examined on 09/29/2024 PCP: [facility]  Patient coming from: Long-term care  Chief Complaint:  Chief Complaint  Patient presents with   Shortness of Breath   HPI: Eugene [name] is a 64 y.o. male with medical history significant of anxiety, depression, type 2 diabetes mellitus, history of hepatitis C, end-stage renal disease on dialysis, bilateral BKA.  Patient known to most historian.  States he is short of breath.  He is sick to his stomach.  He vomited.  No diarrhea.  He is short of breath.  He is having a fever.  In the ER he was found to have an elevated white blood cell count and started on sepsis protocol.  Respiratory panel pending, chest x-ray shows mild interstitial edema and left greater than right pleural effusion.  Urine looks positive and patient has a suprapubic catheter.  Hospitalist services contacted for further evaluation. Review of Systems: Review of Systems  Constitutional:  Positive for fever and malaise/fatigue. Negative for chills and weight loss.  HENT:  Negative for sore throat.   Eyes:  Negative for blurred vision.  Respiratory:  Positive for cough and shortness of breath.   Cardiovascular:  Negative for chest pain.  Gastrointestinal:  Positive for abdominal pain. Negative for heartburn, nausea and vomiting.  Genitourinary:  Negative for dysuria.  Musculoskeletal:  Positive for joint pain.  Skin:  Negative for rash.  Neurological:  Negative for weakness.  Endo/Heme/Allergies:  Does not bruise/bleed easily.  Psychiatric/Behavioral:  Positive for depression. The patient is nervous/anxious.     Past Medical History:  Diagnosis Date   Anxiety    Depression    Diabetes mellitus without complication (HCC)    End stage renal disease (HCC)    Hepatitis C    Hyperlipidemia    IBS (irritable bowel syndrome)    Osteomyelitis  (HCC)    Spinal stenosis    Past Surgical History:  Procedure Laterality Date   BACK SURGERY     bilateral amputation Bilateral    BKA Bilateral    CENTRAL LINE INSERTION-TUNNELED N/A 02/02/2019   Procedure: CENTRAL LINE INSERTION-TUNNELED;  Surgeon: Marea Selinda RAMAN, MD;  Location: [facility];  Service: Cardiovascular;  Laterality: N/A;   COLONOSCOPY WITH PROPOFOL  N/A 01/12/2020   Procedure: COLONOSCOPY WITH PROPOFOL ;  Surgeon: Jinny Carmine, MD;  Location: [facility] ENDOS[facility];  Service: Endoscopy;  Laterality: N/A;   DIALYSIS/PERMA CATHETER INSERTION N/A 05/22/2024   Procedure: DIALYSIS/PERMA CATHETER INSERTION;  Surgeon: Jama Cordella MATSU, MD;  Location: [facility];  Service: Cardiovascular;  Laterality: N/A;   DIALYSIS/PERMA CATHETER INSERTION N/A 07/21/2024   Procedure: DIALYSIS/PERMA CATHETER INSERTION;  Surgeon: Marea Selinda RAMAN, MD;  Location: [facility];  Service: Cardiovascular;  Laterality: N/A;   ESOPHAGOGASTRODUODENOSCOPY (EGD) WITH PROPOFOL  N/A 12/07/2019   Procedure: ESOPHAGOGASTRODUODENOSCOPY (EGD) WITH PROPOFOL ;  Surgeon: Unk Corinn Skiff, MD;  Location: [facility] END[facility];  Service: Gastroenterology;  Laterality: N/A;   IR CATHETER TUBE CHANGE  12/04/2019   SPINAL FUSION     THORACIC SPINE SURGERY  01/2019   extensive washout   Social History:  reports that he has quit smoking. His smoking use included cigarettes. He has never used smokeless tobacco. He reports that he does not currently use alcohol. He reports that he does not currently use drugs.  Allergies[1]  Family History  Problem Relation Age of Onset   Kidney failure  Father    Kidney failure Brother     Prior to Admission medications  Medication Sig Start Date End Date Taking? Authorizing Provider  acetaminophen  (TYLENOL ) 325 MG tablet Take 650 mg by mouth in the morning and at bedtime.    [provider]  bisacodyl  (DULCOLAX) 10 MG suppository Place 10 mg rectally daily as needed  for moderate constipation.    [provider]  bisacodyl  (DULCOLAX) 5 MG EC tablet Take 2 tablets (10 mg total) by mouth at bedtime. 05/16/24   Fausto Burnard LABOR, DO  carvedilol  (COREG ) 6.25 MG tablet Take 1 tablet (6.25 mg total) by mouth 2 (two) times daily with a meal. 05/28/24   Caleen Qualia, MD  cetirizine (ZYRTEC) 10 MG tablet Take 10 mg by mouth daily.    [provider]  cyanocobalamin  1000 MCG tablet Take 1 tablet (1,000 mcg total) by mouth daily. 05/17/24   Fausto Burnard LABOR, DO  diltiazem  (CARDIZEM  CD) 240 MG 24 hr capsule Take 1 capsule (240 mg total) by mouth every evening. 05/28/24   Amin, Sumayya, MD  Doxepin  HCl 6 MG TABS Take 6 mg by mouth at bedtime.    [provider]  famotidine  (PEPCID ) 10 MG tablet Take 1 tablet (10 mg total) by mouth daily. 05/17/24   Fausto Burnard LABOR, DO  folic acid  (FOLVITE ) 1 MG tablet Take 1 tablet (1 mg total) by mouth daily. 05/17/24   Fausto Burnard LABOR, DO  furosemide  (LASIX ) 80 MG tablet Take 1 tablet (80 mg total) by mouth daily. 05/28/24   Caleen Qualia, MD  gabapentin  (NEURONTIN ) 300 MG capsule Take 300 mg by mouth at bedtime.    [provider]  guaifenesin  (ROBITUSSIN) 100 MG/5ML syrup Take 200 mg by mouth 3 (three) times daily as needed for cough.    [provider]  insulin  glargine-yfgn (SEMGLEE ) 100 UNIT/ML injection Inject 0.1 mLs (10 Units total) into the skin 2 (two) times daily. 05/01/24   Jhonny Calvin NOVAK, MD  insulin  lispro (HUMALOG ) 100 UNIT/ML injection Inject 0-15 Units into the skin 4 (four) times daily -  before meals and at bedtime. <150= 0 units 151-200= 3 units 201-250= 6 units 251-300= 9 units 301-350= 12 units 351-400= 15 units >400 call MD Patient taking differently: Inject 0-15 Units into the skin 4 (four) times daily -  before meals and at bedtime. <150= 0 units 151-200= 3 units 201-250= 6 units 251-300= 9 units 301-350= 12 units 351-400= 15 units >400 call MD    [provider]  ipratropium-albuterol  (DUONEB) 0.5-2.5 (3) MG/3ML SOLN Take 3 mLs by nebulization every 6 (six) hours as needed.    [provider]  Lactulose  20 GM/30ML SOLN Take 30 mLs by mouth 2 (two) times daily.    [provider]  levothyroxine  (SYNTHROID ) 175 MCG tablet Take 175 mcg by mouth at bedtime.    [provider]  linaclotide  (LINZESS ) 145 MCG CAPS capsule Take 1 capsule (145 mcg total) by mouth daily before breakfast. 10/25/19   Danford, Lonni SQUIBB, MD  liver oil-zinc  oxide (DESITIN) 40 % ointment Apply 1 Application topically as needed for irritation.    [provider]  Melatonin 5 MG TABS Take 10 mg by mouth at bedtime.    [provider]  metoCLOPramide  (REGLAN ) 10 MG tablet Take 0.5 tablets (5 mg total) by mouth every 8 (eight) hours as needed for nausea or vomiting. 05/28/24   Caleen Qualia, MD  midodrine  (PROAMATINE ) 5 MG tablet Take  5 mg by mouth 3 (three) times daily with meals as needed.    [provider]  ondansetron  (ZOFRAN ) 4 MG tablet Take 4 mg by mouth every 8 (eight) hours as needed for nausea or vomiting.    [provider]  oxyCODONE  (ROXICODONE ) 15 MG immediate release tablet Take 7.5 mg by mouth every 6 (six) hours as needed. 07/15/24   [provider]  polyethylene glycol (MIRALAX  / GLYCOLAX ) 17 g packet Take 17 g by mouth 2 (two) times daily. 10/24/19   Danford, Lonni SQUIBB, MD  senna-docusate (SENOKOT-S) 8.6-50 MG tablet Take 1 tablet by mouth 2 (two) times daily. 10/24/19   Danford, Lonni SQUIBB, MD  simethicone  (MYLICON) 80 MG chewable tablet Chew 1 tablet (80 mg total) by mouth 4 (four) times daily. 05/01/24   Jhonny Calvin NOVAK, MD  sitaGLIPtin (JANUVIA) 25 MG tablet Take 25 mg by mouth daily.    [provider]  Vitamin D , Ergocalciferol , (DRISDOL ) 1.25 MG (50000 UNIT) CAPS capsule Take 1 capsule (50,000 Units total) by mouth every 7 (seven) days. 05/18/24   Fausto Sor A,  DO  pregabalin  (LYRICA ) 75 MG capsule Take 75 mg by mouth 2 (two) times daily. Patient not taking: Reported on 04/17/2021  04/20/21  [provider]    Physical Exam: Vitals:   09/29/24 1414 09/29/24 1500 09/29/24 1600 09/29/24 1609  BP:  132/67 126/69   Pulse:  (!) 102 95 95  Resp:  15 17 18   Temp: (!) 100.4 F (38 C)     TempSrc: Oral     SpO2:  90% 92% 93%  Weight:      Height:       Physical Exam HENT:     Head: Normocephalic.     Mouth/Throat:     Pharynx: No oropharyngeal exudate.  Eyes:     General: Lids are normal.     Conjunctiva/sclera: Conjunctivae normal.  Cardiovascular:     Rate and Rhythm: Normal rate and regular rhythm.     Heart sounds: Normal heart sounds, S1 normal and S2 normal.  Pulmonary:     Breath sounds: Examination of the right-lower field reveals decreased breath sounds. Examination of the left-lower field reveals decreased breath sounds. Decreased breath sounds present. No wheezing, rhonchi or rales.  Abdominal:     General: There is distension.     Palpations: Abdomen is soft.     Tenderness: There is no abdominal tenderness.  Musculoskeletal:     Right upper leg: Swelling present.     Left upper leg: Swelling present.     Right Lower Extremity: Right leg is amputated below knee.     Left Lower Extremity: Left leg is amputated below knee.  Skin:    General: Skin is warm.     Findings: No rash.  Neurological:     Mental Status: He is lethargic.     Data Reviewed: EKG shows a sinus tachycardia 101 bpm no acute ST-T wave changes INR 1.1, white blood count 26.4, hemoglobin 11.5, platelet count 139, left shift with normal platelets Lactic acid 1.7, GFR 15, creatinine 4.37, electrolytes normal range, glucose 256  Assessment and Plan: * SIRS (systemic inflammatory response syndrome) (HCC) Present on admission.  Patient with fever leukocytosis and tachycardia.  Not sure of the source just yet.  Could be secondary to suprapubic  catheter.  Follow-up urine culture and blood cultures.  Empiric antibiotics with vancomycin  and Zosyn .  Will hold off on aggressive fluids secondary to being  a dialysis patient.  If this is a urine infection then the suprapubic catheter may have to be changed.  Abdominal discomfort Will obtain abdominal x-ray flat and upright.  Continue medications for constipation.  ESRD (end stage renal disease) (HCC) Consulted nephrology for dialysis tomorrow  Paroxysmal SVT (supraventricular tachycardia) On Cardizem  and Coreg   Heart failure with preserved ejection fraction (HCC) Dialysis to manage fluid and Lasix  in the evenings.  Chronic respiratory failure with hypoxia (HCC) On 6 L  Chronic bilateral pleural effusions Left greater than right.  Continue Lasix  in the evenings and dialysis.  Chronic pain On oxycodone   Uncontrolled type 2 diabetes mellitus with hyperglycemia, with long-term current use of insulin  (HCC) Continue Lantus  and sliding scale  Chronic constipation Patient on numerous medications.  Obesity (BMI 30-39.9) BMI 37.64  Hypothyroidism On levothyroxine        Advance Care Planning:   Code Status: Full Code   Consults: Nephrology  Family Communication: Updated brother of admission  Severity of Illness: The appropriate patient status for this patient is OBSERVATION. Observation status is judged to be reasonable and necessary in order to provide the required intensity of service to ensure the patient's safety. The patient's presenting symptoms, physical exam findings, and initial radiographic and laboratory data in the context of their medical condition is felt to place them at decreased risk for further clinical deterioration. Furthermore, it is anticipated that the patient will be medically stable for discharge from the hospital within 2 midnights of admission.   Author: Charlie Patterson, MD 09/29/2024 4:19 PM  For on call review [email or web address].     [1] No Known  Allergies  "

## 2024-09-29 NOTE — Assessment & Plan Note (Addendum)
 Present on admission.  Patient with fever leukocytosis and tachycardia.  Not sure of the source just yet.  Could be secondary to suprapubic catheter.  Follow-up urine culture and blood cultures.  Empiric antibiotics with vancomycin  and Maxipime .  Will hold off on aggressive fluids secondary to being a dialysis patient.  If this is a urine infection then the suprapubic catheter may have to be changed.

## 2024-09-30 ENCOUNTER — Observation Stay

## 2024-09-30 DIAGNOSIS — R7881 Bacteremia: Secondary | ICD-10-CM | POA: Diagnosis not present

## 2024-09-30 DIAGNOSIS — L89312 Pressure ulcer of right buttock, stage 2: Secondary | ICD-10-CM | POA: Diagnosis present

## 2024-09-30 DIAGNOSIS — B9562 Methicillin resistant Staphylococcus aureus infection as the cause of diseases classified elsewhere: Secondary | ICD-10-CM

## 2024-09-30 DIAGNOSIS — Z1152 Encounter for screening for COVID-19: Secondary | ICD-10-CM | POA: Diagnosis not present

## 2024-09-30 DIAGNOSIS — T80211A Bloodstream infection due to central venous catheter, initial encounter: Secondary | ICD-10-CM | POA: Diagnosis present

## 2024-09-30 DIAGNOSIS — D631 Anemia in chronic kidney disease: Secondary | ICD-10-CM | POA: Diagnosis present

## 2024-09-30 DIAGNOSIS — R6510 Systemic inflammatory response syndrome (SIRS) of non-infectious origin without acute organ dysfunction: Secondary | ICD-10-CM | POA: Diagnosis not present

## 2024-09-30 DIAGNOSIS — E66812 Obesity, class 2: Secondary | ICD-10-CM | POA: Diagnosis present

## 2024-09-30 DIAGNOSIS — N186 End stage renal disease: Secondary | ICD-10-CM | POA: Diagnosis present

## 2024-09-30 DIAGNOSIS — J90 Pleural effusion, not elsewhere classified: Secondary | ICD-10-CM | POA: Diagnosis present

## 2024-09-30 DIAGNOSIS — N2581 Secondary hyperparathyroidism of renal origin: Secondary | ICD-10-CM | POA: Diagnosis present

## 2024-09-30 DIAGNOSIS — B964 Proteus (mirabilis) (morganii) as the cause of diseases classified elsewhere: Secondary | ICD-10-CM | POA: Diagnosis present

## 2024-09-30 DIAGNOSIS — K5900 Constipation, unspecified: Secondary | ICD-10-CM

## 2024-09-30 DIAGNOSIS — D649 Anemia, unspecified: Secondary | ICD-10-CM | POA: Diagnosis not present

## 2024-09-30 DIAGNOSIS — D6959 Other secondary thrombocytopenia: Secondary | ICD-10-CM | POA: Diagnosis present

## 2024-09-30 DIAGNOSIS — E039 Hypothyroidism, unspecified: Secondary | ICD-10-CM | POA: Diagnosis present

## 2024-09-30 DIAGNOSIS — T827XXA Infection and inflammatory reaction due to other cardiac and vascular devices, implants and grafts, initial encounter: Secondary | ICD-10-CM | POA: Diagnosis not present

## 2024-09-30 DIAGNOSIS — A4102 Sepsis due to Methicillin resistant Staphylococcus aureus: Secondary | ICD-10-CM | POA: Diagnosis present

## 2024-09-30 DIAGNOSIS — Z794 Long term (current) use of insulin: Secondary | ICD-10-CM | POA: Diagnosis not present

## 2024-09-30 DIAGNOSIS — I132 Hypertensive heart and chronic kidney disease with heart failure and with stage 5 chronic kidney disease, or end stage renal disease: Secondary | ICD-10-CM | POA: Diagnosis present

## 2024-09-30 DIAGNOSIS — Z992 Dependence on renal dialysis: Secondary | ICD-10-CM | POA: Diagnosis not present

## 2024-09-30 DIAGNOSIS — E1122 Type 2 diabetes mellitus with diabetic chronic kidney disease: Secondary | ICD-10-CM | POA: Diagnosis present

## 2024-09-30 DIAGNOSIS — I471 Supraventricular tachycardia, unspecified: Secondary | ICD-10-CM | POA: Diagnosis present

## 2024-09-30 DIAGNOSIS — Y840 Cardiac catheterization as the cause of abnormal reaction of the patient, or of later complication, without mention of misadventure at the time of the procedure: Secondary | ICD-10-CM | POA: Diagnosis present

## 2024-09-30 DIAGNOSIS — N319 Neuromuscular dysfunction of bladder, unspecified: Secondary | ICD-10-CM | POA: Diagnosis not present

## 2024-09-30 DIAGNOSIS — J9811 Atelectasis: Secondary | ICD-10-CM | POA: Diagnosis not present

## 2024-09-30 DIAGNOSIS — K567 Ileus, unspecified: Secondary | ICD-10-CM | POA: Diagnosis not present

## 2024-09-30 DIAGNOSIS — I5030 Unspecified diastolic (congestive) heart failure: Secondary | ICD-10-CM | POA: Diagnosis present

## 2024-09-30 DIAGNOSIS — B1920 Unspecified viral hepatitis C without hepatic coma: Secondary | ICD-10-CM | POA: Diagnosis present

## 2024-09-30 DIAGNOSIS — J449 Chronic obstructive pulmonary disease, unspecified: Secondary | ICD-10-CM | POA: Diagnosis present

## 2024-09-30 DIAGNOSIS — I871 Compression of vein: Secondary | ICD-10-CM | POA: Diagnosis not present

## 2024-09-30 DIAGNOSIS — Z6841 Body Mass Index (BMI) 40.0 and over, adult: Secondary | ICD-10-CM | POA: Diagnosis not present

## 2024-09-30 DIAGNOSIS — F32A Depression, unspecified: Secondary | ICD-10-CM | POA: Diagnosis present

## 2024-09-30 DIAGNOSIS — J9621 Acute and chronic respiratory failure with hypoxia: Secondary | ICD-10-CM | POA: Diagnosis not present

## 2024-09-30 DIAGNOSIS — E1165 Type 2 diabetes mellitus with hyperglycemia: Secondary | ICD-10-CM | POA: Diagnosis not present

## 2024-09-30 LAB — BLOOD CULTURE ID PANEL (REFLEXED) - BCID2

## 2024-09-30 LAB — HEPATITIS B SURFACE ANTIGEN: Hepatitis B Surface Ag: NONREACTIVE

## 2024-09-30 LAB — BASIC METABOLIC PANEL WITH GFR
Anion gap: 13 (ref 5–15)
BUN: 70 mg/dL — ABNORMAL HIGH (ref 8–23)
CO2: 22 mmol/L (ref 22–32)
Calcium: 8.2 mg/dL — ABNORMAL LOW (ref 8.9–10.3)
Chloride: 101 mmol/L (ref 98–111)
Creatinine, Ser: 4.81 mg/dL — ABNORMAL HIGH (ref 0.61–1.24)
GFR, Estimated: 13 mL/min — ABNORMAL LOW
Glucose, Bld: 398 mg/dL — ABNORMAL HIGH (ref 70–99)
Potassium: 5.1 mmol/L (ref 3.5–5.1)
Sodium: 136 mmol/L (ref 135–145)

## 2024-09-30 LAB — CBC
HCT: 32.7 % — ABNORMAL LOW (ref 39.0–52.0)
Hemoglobin: 9.9 g/dL — ABNORMAL LOW (ref 13.0–17.0)
MCH: 29.4 pg (ref 26.0–34.0)
MCHC: 30.3 g/dL (ref 30.0–36.0)
MCV: 97 fL (ref 80.0–100.0)
Platelets: 118 K/uL — ABNORMAL LOW (ref 150–400)
RBC: 3.37 MIL/uL — ABNORMAL LOW (ref 4.22–5.81)
RDW: 14.5 % (ref 11.5–15.5)
WBC: 16.8 K/uL — ABNORMAL HIGH (ref 4.0–10.5)
nRBC: 0 % (ref 0.0–0.2)

## 2024-09-30 LAB — LIPASE, BLOOD: Lipase: <10 U/L — ABNORMAL LOW (ref 11–51)

## 2024-09-30 LAB — MRSA NEXT GEN BY PCR, NASAL: MRSA by PCR Next Gen: DETECTED — AB

## 2024-09-30 LAB — GLUCOSE, CAPILLARY
Glucose-Capillary: 346 mg/dL — ABNORMAL HIGH (ref 70–99)
Glucose-Capillary: 267 mg/dL — ABNORMAL HIGH (ref 70–99)
Glucose-Capillary: 267 mg/dL — ABNORMAL HIGH (ref 70–99)
Glucose-Capillary: 271 mg/dL — ABNORMAL HIGH (ref 70–99)

## 2024-09-30 MED ORDER — HEPARIN SODIUM (PORCINE) 1000 UNIT/ML IJ SOLN
INTRAMUSCULAR | Status: AC
  Filled 2024-09-30: qty 5

## 2024-09-30 MED ORDER — HEPARIN SODIUM (PORCINE) 1000 UNIT/ML DIALYSIS
1000.0000 [IU] | INTRAMUSCULAR | Status: DC | PRN
  Administered 2024-09-30: 1000 [IU]

## 2024-09-30 MED ORDER — VANCOMYCIN HCL IN DEXTROSE 1-5 GM/200ML-% IV SOLN
1000.0000 mg | Freq: Once | INTRAVENOUS | Status: AC
  Administered 2024-09-30: 1000 mg via INTRAVENOUS
  Filled 2024-09-30: qty 200

## 2024-09-30 MED ORDER — HEPARIN SODIUM (PORCINE) 1000 UNIT/ML DIALYSIS: 1000.0000 [IU] | INTRAMUSCULAR | Status: DC | PRN

## 2024-09-30 MED ORDER — MIDODRINE HCL 5 MG PO TABS
ORAL_TABLET | ORAL | Status: AC
  Filled 2024-09-30: qty 1

## 2024-09-30 MED ORDER — ALTEPLASE 2 MG IJ SOLR: 2.0000 mg | Freq: Once | INTRAMUSCULAR | Status: DC | PRN

## 2024-09-30 MED ORDER — PROMETHAZINE (PHENERGAN) 6.25MG IN NS 50ML IVPB
6.2500 mg | Freq: Four times a day (QID) | INTRAVENOUS | Status: AC | PRN
  Administered 2024-09-30 – 2024-10-04 (×11): 6.25 mg via INTRAVENOUS
  Filled 2024-09-30: qty 6.25
  Filled 2024-09-30 (×3): qty 50
  Filled 2024-09-30: qty 6.25
  Filled 2024-09-30 (×3): qty 50
  Filled 2024-09-30: qty 6.25
  Filled 2024-09-30 (×2): qty 50
  Filled 2024-09-30 (×2): qty 6.25
  Filled 2024-09-30: qty 50

## 2024-09-30 MED ORDER — MIDODRINE HCL 5 MG PO TABS: 5.0000 mg | ORAL_TABLET | Freq: Every day | ORAL | Status: DC

## 2024-09-30 MED ORDER — OXYCODONE HCL 5 MG PO TABS
ORAL_TABLET | ORAL | Status: AC
  Filled 2024-09-30: qty 2

## 2024-09-30 NOTE — Progress Notes (Signed)
" °   09/30/24 1151  Vitals  BP (!) 169/86  MAP (mmHg) 94  BP Location Right Arm  BP Method Automatic  Patient Position (if appropriate) Lying  Pulse Rate 88  Pulse Rate Source Monitor  ECG Heart Rate 89  Resp (!) 27  Type of Weight Post-Dialysis (Unable to get weight. Pt only been on machine for 40 mins)  Oxygen Therapy  SpO2 (!) 85 %  O2 Device Nasal Cannula  O2 Flow Rate (L/min) 10 L/min  Patient Activity (if Appropriate) In bed  Pulse Oximetry Type Continuous  Oximetry Probe Site Changed No  During Treatment Monitoring  Blood Flow Rate (mL/min) 199 mL/min  Arterial Pressure (mmHg) -84.04 mmHg  Venous Pressure (mmHg) 92.52 mmHg  TMP (mmHg) 15.35 mmHg  Ultrafiltration Rate (mL/min) 0 mL/min  Dialysate Flow Rate (mL/min) 300 ml/min  Duration of HD Treatment -hour(s) 0.59 hour(s)  Cumulative Fluid Removed (mL) per Treatment  -38.36  HD Safety Checks Performed Yes  Intra-Hemodialysis Comments  (pt was d/c's 2 :51 mins early.)  Post Treatment  Dialyzer Clearance Lightly streaked  Hemodialysis Intake (mL) 0 mL  Liters Processed 11.1  Fluid Removed (mL) 0 mL  Note  Patient Observations At 1151 pt c/o sob. O2 stats is at 85 % on 8 L. increased o2 to 10 L. Notified NP Breeze Pt moaning in pain stating he wants to come off machine. C/o stomach pain and feeling of need to throw up during tx today.  l called resp.they will come up to floor and do breathing tx. NP stated to take pt off machine. pt came off at 2:51 mins early. Meds Oxy 7.5 mgs.bs check 251  Hemodialysis Catheter Right Internal jugular Double lumen Permanent (Tunneled)  Placement Date/Time: 07/21/24 1351   Serial / Lot #: [id number]  Expiration Date: 02/21/29  Time Out: Correct patient;Correct site;Correct procedure  Maximum sterile barrier precautions: Hand hygiene;Large sterile sheet;Sterile probe cover;Cap;Mask;Ste...  Site Condition No complications  Blue Lumen Status Flushed;Heparin  locked;Dead end cap in place  Red  Lumen Status Flushed;Heparin  locked;Dead end cap in place  Purple Lumen Status N/A  Catheter fill solution Heparin  1000 units/ml  Catheter fill volume (Arterial) 1.8 cc  Catheter fill volume (Venous) 1.8  Dressing Type Transparent  Dressing Status Antimicrobial disc/dressing in place;Clean, Dry, Intact  Interventions New dressing;Dressing changed  Drainage Description None  Dressing Change Due 10/07/24  Post treatment catheter status Capped and Clamped    "

## 2024-09-30 NOTE — Progress Notes (Addendum)
..  TOC Brief note  Patient is from home on 6 L [place] and hemo dialysis. Admitted with SIRS and started on IV abx empirically, urine cx are pending.  The medical record has been reviewed. The patient is from LTC at [facility]. The patient has a payor and PCP on record. No SDOH interventions needed.   No recs at this time, please outreach to [facility] if needs are identified.

## 2024-09-30 NOTE — Progress Notes (Signed)
 PHARMACY - PHYSICIAN COMMUNICATION CRITICAL VALUE ALERT - BLOOD CULTURE IDENTIFICATION (BCID)  Results for orders placed or performed during the hospital encounter of 09/29/24  Resp panel by RT-PCR (RSV, Flu A&B, Covid) Anterior Nasal Swab     Status: None   Collection Time: 09/29/24  2:16 PM   Specimen: Anterior Nasal Swab  Result Value Ref Range Status   SARS Coronavirus 2 by RT PCR NEGATIVE NEGATIVE Final    Comment: (NOTE) SARS-CoV-2 target nucleic acids are NOT DETECTED.  The SARS-CoV-2 RNA is generally detectable in upper respiratory specimens during the acute phase of infection. The lowest concentration of SARS-CoV-2 viral copies this assay can detect is 138 copies/mL. A negative result does not preclude SARS-Cov-2 infection and should not be used as the sole basis for treatment or other patient management decisions. A negative result may occur with  improper specimen collection/handling, submission of specimen other than nasopharyngeal swab, presence of viral mutation(s) within the areas targeted by this assay, and inadequate number of viral copies(<138 copies/mL). A negative result must be combined with clinical observations, patient history, and epidemiological information. The expected result is Negative.  Fact Sheet for Patients:  bloggercourse.com  Fact Sheet for Healthcare Providers:  seriousbroker.it  This test is no t yet approved or cleared by the [place]  FDA and  has been authorized for detection and/or diagnosis of SARS-CoV-2 by FDA under an Emergency Use Authorization (EUA). This EUA will remain  in effect (meaning this test can be used) for the duration of the COVID-19 declaration under Section 564(b)(1) of the Act, 21 U.S.C.section 360bbb-3(b)(1), unless the authorization is terminated  or revoked sooner.       Influenza A by PCR NEGATIVE NEGATIVE Final   Influenza B by PCR NEGATIVE NEGATIVE Final     Comment: (NOTE) The Xpert Xpress SARS-CoV-2/FLU/RSV plus assay is intended as an aid in the diagnosis of influenza from Nasopharyngeal swab specimens and should not be used as a sole basis for treatment. Nasal washings and aspirates are unacceptable for Xpert Xpress SARS-CoV-2/FLU/RSV testing.  Fact Sheet for Patients: bloggercourse.com  Fact Sheet for Healthcare Providers: seriousbroker.it  This test is not yet approved or cleared by the [place]  FDA and has been authorized for detection and/or diagnosis of SARS-CoV-2 by FDA under an Emergency Use Authorization (EUA). This EUA will remain in effect (meaning this test can be used) for the duration of the COVID-19 declaration under Section 564(b)(1) of the Act, 21 U.S.C. section 360bbb-3(b)(1), unless the authorization is terminated or revoked.     Resp Syncytial Virus by PCR NEGATIVE NEGATIVE Final    Comment: (NOTE) Fact Sheet for Patients: bloggercourse.com  Fact Sheet for Healthcare Providers: seriousbroker.it  This test is not yet approved or cleared by the [place]  FDA and has been authorized for detection and/or diagnosis of SARS-CoV-2 by FDA under an Emergency Use Authorization (EUA). This EUA will remain in effect (meaning this test can be used) for the duration of the COVID-19 declaration under Section 564(b)(1) of the Act, 21 U.S.C. section 360bbb-3(b)(1), unless the authorization is terminated or revoked.  Performed at [facility], [address] [place], [place] [address]   Blood Culture (routine x 2)     Status: None (Preliminary result)   Collection Time: 09/29/24  2:37 PM   Specimen: BLOOD  Result Value Ref Range Status   Specimen Description BLOOD BLOOD RIGHT FOREARM  Final   Special Requests   Final    BOTTLES DRAWN AEROBIC AND  ANAEROBIC Blood Culture results may not be optimal due  to an inadequate volume of blood received in culture bottles   Culture  Setup Time   Final    GRAM POSITIVE COCCI IN BOTH AEROBIC AND ANAEROBIC BOTTLES Performed at [facility], [address] [place], [place] [address]    Culture PENDING  Incomplete   Report Status PENDING  Incomplete  Blood Culture (routine x 2)     Status: None (Preliminary result)   Collection Time: 09/29/24  2:37 PM   Specimen: BLOOD  Result Value Ref Range Status   Specimen Description BLOOD LEFT ANTECUBITAL  Final   Special Requests   Final    BOTTLES DRAWN AEROBIC AND ANAEROBIC Blood Culture results may not be optimal due to an inadequate volume of blood received in culture bottles   Culture  Setup Time   Final    Organism ID to follow GRAM POSITIVE COCCI IN BOTH AEROBIC AND ANAEROBIC BOTTLES Performed at [facility], [address] [place], [place] [address]    Culture PENDING  Incomplete   Report Status PENDING  Incomplete  Blood Culture ID Panel (Reflexed)     Status: Abnormal   Collection Time: 09/29/24  2:37 PM  Result Value Ref Range Status   Enterococcus faecalis NOT DETECTED NOT DETECTED Final   Enterococcus Faecium NOT DETECTED NOT DETECTED Final   Listeria monocytogenes NOT DETECTED NOT DETECTED Final   Staphylococcus species DETECTED (A) NOT DETECTED Final    Comment: CRITICAL RESULT CALLED TO, READ BACK BY AND VERIFIED WITHBETHA RANKIN DILLS PHARMD [id number] 09/30/24 HNM    Staphylococcus aureus (BCID) DETECTED (A) NOT DETECTED Final    Comment: Methicillin (oxacillin)-resistant Staphylococcus aureus (MRSA). MRSA is predictably resistant to beta-lactam antibiotics (except ceftaroline). Preferred therapy is vancomycin  unless clinically contraindicated. Patient requires contact precautions if  hospitalized. CRITICAL RESULT CALLED TO, READ BACK BY AND VERIFIED WITH: RANKIN DILLS PHARMD [id number] 09/30/24 HNM    Staphylococcus epidermidis NOT DETECTED NOT DETECTED Final   Staphylococcus  lugdunensis NOT DETECTED NOT DETECTED Final   Streptococcus species NOT DETECTED NOT DETECTED Final   Streptococcus agalactiae NOT DETECTED NOT DETECTED Final   Streptococcus pneumoniae NOT DETECTED NOT DETECTED Final   Streptococcus pyogenes NOT DETECTED NOT DETECTED Final   A.calcoaceticus-baumannii NOT DETECTED NOT DETECTED Final   Bacteroides fragilis NOT DETECTED NOT DETECTED Final   Enterobacterales NOT DETECTED NOT DETECTED Final   Enterobacter cloacae complex NOT DETECTED NOT DETECTED Final   Escherichia coli NOT DETECTED NOT DETECTED Final   Klebsiella aerogenes NOT DETECTED NOT DETECTED Final   Klebsiella oxytoca NOT DETECTED NOT DETECTED Final   Klebsiella pneumoniae NOT DETECTED NOT DETECTED Final   Proteus species NOT DETECTED NOT DETECTED Final   Salmonella species NOT DETECTED NOT DETECTED Final   Serratia marcescens NOT DETECTED NOT DETECTED Final   Haemophilus influenzae NOT DETECTED NOT DETECTED Final   Neisseria meningitidis NOT DETECTED NOT DETECTED Final   Pseudomonas aeruginosa NOT DETECTED NOT DETECTED Final   Stenotrophomonas maltophilia NOT DETECTED NOT DETECTED Final   Candida albicans NOT DETECTED NOT DETECTED Final   Candida auris NOT DETECTED NOT DETECTED Final   Candida glabrata NOT DETECTED NOT DETECTED Final   Candida krusei NOT DETECTED NOT DETECTED Final   Candida parapsilosis NOT DETECTED NOT DETECTED Final   Candida tropicalis NOT DETECTED NOT DETECTED Final   Cryptococcus neoformans/gattii NOT DETECTED NOT DETECTED Final   Meth resistant mecA/C and MREJ DETECTED (A) NOT DETECTED Final  Comment: CRITICAL RESULT CALLED TO, READ BACK BY AND VERIFIED WITHBETHA RANKIN DILLS [facility] 9542 09/30/24 HNM Performed at [facility], [address], [place], [place] [address]     BCID Results: 4 of 4 bottles w/ MRSA.  Pt currently on Cefepime  and Vancomycin .  Name of provider contacted: N. Donati-Garmon, NP   Changes to prescribed antibiotics  required: D/C Cefepime , continue Vancomycin .  Rankin CANDIE Dills, PharmD, [facility] 09/30/2024 5:01 AM

## 2024-09-30 NOTE — Consult Note (Signed)
 NAME: Eugene [name]  DOB: 02/12/1961  MRN: [id number]  Date/Time: 09/30/2024 12:46 PM  REQUESTING PROVIDER:AUTO consullt Subjective:  REASON FOR CONSULT: MRSA bacteremia ? Eugene [name] is a 64 y.o. 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, , osteomyelitis of vertebrae and  2 prolonged MRSA bacteremia and spinal osteomyelitis with epidural abscess  underwent T8-T9 laminectomy T8-T9 transpedicular corpectomies and T5-T11 posterolateral arthrodesis, ESRD on HD , suprapubic catheter, HTN, DM, HLD presented with sob for [facility] HE was also nauseous Vitals in the ED Temp 100.4 BP 132/67 HR 102 Sats 90%   Latest Reference Range & Units 09/29/24 14:37  WBC 4.0 - 10.5 K/uL 26.4 (H)  Hemoglobin 13.0 - 17.0 g/dL 88.4 (L)  HCT 60.9 - 47.9 % 36.1 (L)  Platelets 150 - 400 K/uL 139 (L)  Creatinine 0.61 - 1.24 mg/dL 5.62 (H)    Blood culture sent CXR Cardiomegaly, left pleural effusion \\CT  abdomen and pelvis rectosigmoid wall thickeneing  Constipated for more than a week I am seeing the patient as the blood culture came back as MRSA HE has a dialysis cath on the Rt subclavian   Past Medical History:  Diagnosis Date   Anxiety    Depression    Diabetes mellitus without complication (HCC)    End stage renal disease (HCC)    Hepatitis C    Hyperlipidemia    IBS (irritable bowel syndrome)    Osteomyelitis (HCC)    Spinal stenosis     Past Surgical History:  Procedure Laterality Date   BACK SURGERY     bilateral amputation Bilateral    BKA Bilateral    CENTRAL LINE INSERTION-TUNNELED N/A 02/02/2019   Procedure: CENTRAL LINE INSERTION-TUNNELED;  Surgeon: Marea Selinda RAMAN, MD;  Location: [facility];  Service: Cardiovascular;  Laterality: N/A;   COLONOSCOPY WITH PROPOFOL  N/A 01/12/2020   Procedure: COLONOSCOPY WITH PROPOFOL ;  Surgeon: Jinny Carmine, MD;  Location: [facility];  Service: Endoscopy;  Laterality: N/A;    DIALYSIS/PERMA CATHETER INSERTION N/A 05/22/2024   Procedure: DIALYSIS/PERMA CATHETER INSERTION;  Surgeon: Jama Cordella MATSU, MD;  Location: [facility];  Service: Cardiovascular;  Laterality: N/A;   DIALYSIS/PERMA CATHETER INSERTION N/A 07/21/2024   Procedure: DIALYSIS/PERMA CATHETER INSERTION;  Surgeon: Marea Selinda RAMAN, MD;  Location: [facility];  Service: Cardiovascular;  Laterality: N/A;   ESOPHAGOGASTRODUODENOSCOPY (EGD) WITH PROPOFOL  N/A 12/07/2019   Procedure: ESOPHAGOGASTRODUODENOSCOPY (EGD) WITH PROPOFOL ;  Surgeon: Unk Corinn Skiff, MD;  Location: [facility];  Service: Gastroenterology;  Laterality: N/A;   IR CATHETER TUBE CHANGE  12/04/2019   SPINAL FUSION     THORACIC SPINE SURGERY  01/2019   extensive washout    Social History   Socioeconomic History   Marital status: Single    Spouse name: Not on file   Number of children: Not on file   Years of education: Not on file   Highest education level: Not on file  Occupational History   Not on file  Tobacco Use   Smoking status: Former    Current packs/day: 0.25    Types: Cigarettes   Smokeless tobacco: Never  Vaping Use   Vaping status: Every Day   Substances: Nicotine   Substance and Sexual Activity   Alcohol use: Not Currently   Drug use: Not Currently   Sexual activity: Not Currently  Other Topics Concern   Not on file  Social History Narrative   Resides at [employer]  Social Drivers of Health   Tobacco Use: Medium Risk (09/29/2024)   Patient History    Smoking Tobacco Use: Former    Smokeless Tobacco Use: Never    Passive Exposure: Not on Actuary Strain: Not on file  Food Insecurity: No Food Insecurity (09/29/2024)   Epic    Worried About Programme Researcher, Broadcasting/film/video in the Last Year: Never true    Ran Out of Food in the Last Year: Never true  Transportation Needs: No Transportation Needs (09/29/2024)   Epic    Lack of Transportation (Medical): No    Lack of  Transportation (Non-Medical): No  Physical Activity: Not on file  Stress: Not on file  Social Connections: Socially Isolated (09/29/2024)   Social Connection and Isolation Panel    Frequency of Communication with Friends and Family: More than three times a week    Frequency of Social Gatherings with Friends and Family: Once a week    Attends Religious Services: Never    Database Administrator or Organizations: No    Attends Banker Meetings: Never    Marital Status: Never married  Intimate Partner Violence: Not At Risk (09/29/2024)   Epic    Fear of Current or Ex-Partner: No    Emotionally Abused: No    Physically Abused: No    Sexually Abused: No  Depression (PHQ2-9): Not on file  Alcohol Screen: Not on file  Housing: Low Risk (09/29/2024)   Epic    Unable to Pay for Housing in the Last Year: No    Number of Times Moved in the Last Year: 0    Homeless in the Last Year: No  Utilities: Not At Risk (09/29/2024)   Epic    Threatened with loss of utilities: No  Health Literacy: Not on file    Family History  Problem Relation Age of Onset   Kidney failure Father    Kidney failure Brother    Allergies[1] I? Current Facility-Administered Medications  Medication Dose Route Frequency Provider Last Rate Last Admin   acetaminophen  (TYLENOL ) tablet 650 mg  650 mg Oral Q6H PRN Josette Ade, MD       Or   acetaminophen  (TYLENOL ) suppository 650 mg  650 mg Rectal Q6H PRN Wieting, Richard, MD       alteplase  (CATHFLO ACTIVASE ) injection 2 mg  2 mg Intracatheter Once PRN Druscilla Bald, NP       alteplase  (CATHFLO ACTIVASE ) injection 2 mg  2 mg Intracatheter Once PRN Druscilla Bald, NP       alteplase  (CATHFLO ACTIVASE ) injection 2 mg  2 mg Intracatheter Once PRN Druscilla Bald, NP       alteplase  (CATHFLO ACTIVASE ) injection 2 mg  2 mg Intracatheter Once PRN Druscilla Bald, NP       bisacodyl  (DULCOLAX) EC tablet 10 mg  10 mg Oral QHS Wieting, Richard, MD        bisacodyl  (DULCOLAX) suppository 10 mg  10 mg Rectal Daily PRN Wieting, Richard, MD       carvedilol  (COREG ) tablet 6.25 mg  6.25 mg Oral BID WC Wieting, Richard, MD       Chlorhexidine  Gluconate Cloth 2 % PADS 6 each  6 each Topical Q0600 Druscilla Bald, NP   6 each at 09/30/24 0533   cyanocobalamin  (VITAMIN B12) tablet 1,000 mcg  1,000 mcg Oral Daily Josette Ade, MD   1,000 mcg at 09/30/24 0948   diltiazem  (CARDIZEM  CD) 24 hr capsule 240 mg  240  mg Oral QPM Josette Ade, MD   240 mg at 09/29/24 2244   doxepin  (SINEQUAN ) capsule 10 mg  10 mg Oral QHS Wieting, Richard, MD       famotidine  (PEPCID ) tablet 10 mg  10 mg Oral Daily Josette Ade, MD   10 mg at 09/30/24 9051   folic acid  (FOLVITE ) tablet 1 mg  1 mg Oral Daily Wieting, Richard, MD   1 mg at 09/30/24 9051   gabapentin  (NEURONTIN ) capsule 300 mg  300 mg Oral QHS Wieting, Richard, MD       guaiFENesin  (ROBITUSSIN) 100 MG/5ML liquid 200 mg  200 mg Oral TID PRN Josette Ade, MD       heparin  injection 1,000 Units  1,000 Units Intracatheter PRN Druscilla Bald, NP       heparin  injection 1,000 Units  1,000 Units Intracatheter PRN Druscilla Bald, NP       heparin  injection 1,000 Units  1,000 Units Intracatheter PRN Druscilla Bald, NP       heparin  injection 1,000 Units  1,000 Units Intracatheter PRN Druscilla Bald, NP   1,000 Units at 09/30/24 1124   heparin  injection 5,000 Units  5,000 Units Subcutaneous Q8H Josette Ade, MD   5,000 Units at 09/29/24 2256   insulin  aspart (novoLOG ) injection 0-5 Units  0-5 Units Subcutaneous QHS Wieting, Richard, MD       insulin  aspart (novoLOG ) injection 0-6 Units  0-6 Units Subcutaneous TID WC Josette Ade, MD   4 Units at 09/30/24 9048   insulin  glargine (LANTUS ) injection 10 Units  10 Units Subcutaneous QHS Josette Ade, MD   10 Units at 09/29/24 2245   ipratropium-albuterol  (DUONEB) 0.5-2.5 (3) MG/3ML nebulizer solution 3 mL  3 mL Nebulization Q6H PRN Josette Ade, MD   3 mL at 09/30/24 1224   lactulose  (CHRONULAC ) 10 GM/15ML solution 20 g  20 g Oral BID Josette Ade, MD   20 g at 09/29/24 2257   levothyroxine  (SYNTHROID ) tablet 175 mcg  175 mcg Oral QHS Josette Ade, MD   175 mcg at 09/29/24 2259   linaclotide  (LINZESS ) capsule 145 mcg  145 mcg Oral QAC breakfast Josette Ade, MD   145 mcg at 09/30/24 0957   liver oil-zinc  oxide (DESITIN) 40 % ointment 1 Application  1 Application Topical PRN Wieting, Richard, MD       loratadine  (CLARITIN ) tablet 10 mg  10 mg Oral Daily Josette Ade, MD   10 mg at 09/30/24 9051   midodrine  (PROAMATINE ) tablet 5 mg  5 mg Oral TID WC Wieting, Richard, MD   5 mg at 09/30/24 9048   ondansetron  (ZOFRAN ) tablet 4 mg  4 mg Oral Q6H PRN Josette Ade, MD       Or   ondansetron  (ZOFRAN ) injection 4 mg  4 mg Intravenous Q6H PRN Josette Ade, MD   4 mg at 09/30/24 9057   oxyCODONE  (Oxy IR/ROXICODONE ) immediate release tablet 7.5 mg  7.5 mg Oral Q6H PRN Josette Ade, MD   7.5 mg at 09/30/24 1103   polyethylene glycol (MIRALAX  / GLYCOLAX ) packet 17 g  17 g Oral BID Josette Ade, MD   17 g at 09/29/24 2259   senna-docusate (Senokot-S) tablet 1 tablet  1 tablet Oral BID Josette Ade, MD   1 tablet at 09/30/24 0948   simethicone  (MYLICON) chewable tablet 80 mg  80 mg Oral QID Josette Ade, MD   80 mg at 09/30/24 0948   vancomycin  variable dose per unstable renal function (pharmacist dosing)  Does not apply See admin instructions Josette Ade, MD         Abtx:  Anti-infectives (From admission, onward)    Start     Dose/Rate Route Frequency Ordered Stop   09/30/24 1500  ceFEPIme  (MAXIPIME ) 2 g in sodium chloride  0.9 % 100 mL IVPB  Status:  Discontinued        2 g 200 mL/hr over 30 Minutes Intravenous Every 24 hours 09/29/24 1600 09/30/24 0541   09/29/24 1700  vancomycin  (VANCOREADY) IVPB 2000 mg/400 mL        2,000 mg 200 mL/hr over 120 Minutes Intravenous Once 09/29/24 1603  09/30/24 0036   09/29/24 1611  vancomycin  variable dose per unstable renal function (pharmacist dosing)         Does not apply See admin instructions 09/29/24 1611     09/29/24 1430  ceFEPIme  (MAXIPIME ) 2 g in sodium chloride  0.9 % 100 mL IVPB        2 g 200 mL/hr over 30 Minutes Intravenous  Once 09/29/24 1416 09/29/24 1523   09/29/24 1430  metroNIDAZOLE  (FLAGYL ) IVPB 500 mg        500 mg 100 mL/hr over 60 Minutes Intravenous  Once 09/29/24 1416 09/29/24 1641   09/29/24 1430  vancomycin  (VANCOCIN ) IVPB 1000 mg/200 mL premix  Status:  Discontinued        1,000 mg 200 mL/hr over 60 Minutes Intravenous  Once 09/29/24 1416 09/29/24 1603       REVIEW OF SYSTEMS:  Const:  fever, negative chills, negative weight loss Eyes: negative diplopia or visual changes, negative eye pain ENT: negative coryza, negative sore throat Resp: negative cough, hemoptysis, has dyspnea Cards: negative for chest pain, palpitations, lower extremity edema GU: suprapubic catheter GI: nausea, constipated > 1 week, abdominal distension  Skin: negative for rash and pruritus Heme: negative for easy bruising and gum/nose bleeding MS:  weakness Neurolo:negative for headaches, dizziness, vertigo, memory problems  Psych: anxiety, depression  Endocrine:  diabetes Allergy/Immunology- negative for any medication or food allergies ? Pertinent Positives include : Objective:  VITALS:  BP (!) 177/77 (BP Location: Right Arm)   Pulse 84   Temp 97.7 F (36.5 C)   Resp 20   Ht 5' 10 (1.778 m)   Wt 123.6 kg   SpO2 95%   BMI 39.10 kg/m   PHYSICAL EXAM:  General: Alert, some distress,pale Head: Normocephalic, without obvious abnormality, atraumatic. Eyes: Conjunctivae clear, anicteric sclerae. Pupils are equal ENT Nares normal. No drainage or sinus tenderness. Lips, mucosa, and tongue normal. No Thrush Neck: , symmetrical, no adenopathy, thyroid: non tender no carotid bruit and no JVD. Back: No CVA  tenderness. Lungs: Clear to auscultation bilaterally. No Wheezing or Rhonchi. No rales. Heart: Regular rate and rhythm, no murmur, rub or gallop. Abdomen:  distended. Bowel sounds normal. No masses Supra pubic catheter Extremities: b/l BKA Skin: No rashes or lesions. Or bruising Lymph: Cervical, supraclavicular normal. Neurologic: Grossly non-focal Pertinent Labs Lab Results CBC    Component Value Date/Time   WBC 16.8 (H) 09/30/2024 0425   RBC 3.37 (L) 09/30/2024 0425   HGB 9.9 (L) 09/30/2024 0425   HCT 32.7 (L) 09/30/2024 0425   PLT 118 (L) 09/30/2024 0425   MCV 97.0 09/30/2024 0425   MCH 29.4 09/30/2024 0425   MCHC 30.3 09/30/2024 0425   RDW 14.5 09/30/2024 0425   LYMPHSABS 0.7 09/29/2024 1437   MONOABS 1.9 (H) 09/29/2024 1437   EOSABS 0.0 09/29/2024 1437   BASOSABS 0.1  09/29/2024 1437       Latest Ref Rng & Units 09/30/2024    7:27 AM 09/29/2024    2:37 PM 08/05/2024    9:00 AM  CMP  Glucose 70 - 99 mg/dL 601  743  807   BUN 8 - 23 mg/dL 70  60  32   Creatinine 0.61 - 1.24 mg/dL 5.18  5.62  6.53   Sodium 135 - 145 mmol/L 136  139  137   Potassium 3.5 - 5.1 mmol/L 5.1  4.3  4.6   Chloride 98 - 111 mmol/L 101  102  103   CO2 22 - 32 mmol/L 22  24  27    Calcium  8.9 - 10.3 mg/dL 8.2  8.3  8.0   Total Protein 6.5 - 8.1 g/dL  6.5    Total Bilirubin 0.0 - 1.2 mg/dL  0.4    Alkaline Phos 38 - 126 U/L  83    AST 15 - 41 U/L  21    ALT 0 - 44 U/L  28        Microbiology: Recent Results (from the past 240 hours)  Resp panel by RT-PCR (RSV, Flu A&B, Covid) Anterior Nasal Swab     Status: None   Collection Time: 09/29/24  2:16 PM   Specimen: Anterior Nasal Swab  Result Value Ref Range Status   SARS Coronavirus 2 by RT PCR NEGATIVE NEGATIVE Final    Comment: (NOTE) SARS-CoV-2 target nucleic acids are NOT DETECTED.  The SARS-CoV-2 RNA is generally detectable in upper respiratory specimens during the acute phase of infection. The lowest concentration of SARS-CoV-2 viral  copies this assay can detect is 138 copies/mL. A negative result does not preclude SARS-Cov-2 infection and should not be used as the sole basis for treatment or other patient management decisions. A negative result may occur with  improper specimen collection/handling, submission of specimen other than nasopharyngeal swab, presence of viral mutation(s) within the areas targeted by this assay, and inadequate number of viral copies(<138 copies/mL). A negative result must be combined with clinical observations, patient history, and epidemiological information. The expected result is Negative.  Fact Sheet for Patients:  bloggercourse.com  Fact Sheet for Healthcare Providers:  seriousbroker.it  This test is no t yet approved or cleared by the [place]  FDA and  has been authorized for detection and/or diagnosis of SARS-CoV-2 by FDA under an Emergency Use Authorization (EUA). This EUA will remain  in effect (meaning this test can be used) for the duration of the COVID-19 declaration under Section 564(b)(1) of the Act, 21 U.S.C.section 360bbb-3(b)(1), unless the authorization is terminated  or revoked sooner.       Influenza A by PCR NEGATIVE NEGATIVE Final   Influenza B by PCR NEGATIVE NEGATIVE Final    Comment: (NOTE) The Xpert Xpress SARS-CoV-2/FLU/RSV plus assay is intended as an aid in the diagnosis of influenza from Nasopharyngeal swab specimens and should not be used as a sole basis for treatment. Nasal washings and aspirates are unacceptable for Xpert Xpress SARS-CoV-2/FLU/RSV testing.  Fact Sheet for Patients: bloggercourse.com  Fact Sheet for Healthcare Providers: seriousbroker.it  This test is not yet approved or cleared by the [place]  FDA and has been authorized for detection and/or diagnosis of SARS-CoV-2 by FDA under an Emergency Use Authorization (EUA). This  EUA will remain in effect (meaning this test can be used) for the duration of the COVID-19 declaration under Section 564(b)(1) of the Act, 21 U.S.C. section 360bbb-3(b)(1), unless the authorization  is terminated or revoked.     Resp Syncytial Virus by PCR NEGATIVE NEGATIVE Final    Comment: (NOTE) Fact Sheet for Patients: bloggercourse.com  Fact Sheet for Healthcare Providers: seriousbroker.it  This test is not yet approved or cleared by the [place]  FDA and has been authorized for detection and/or diagnosis of SARS-CoV-2 by FDA under an Emergency Use Authorization (EUA). This EUA will remain in effect (meaning this test can be used) for the duration of the COVID-19 declaration under Section 564(b)(1) of the Act, 21 U.S.C. section 360bbb-3(b)(1), unless the authorization is terminated or revoked.  Performed at [facility], [address], [place], [place] [address]   Blood Culture (routine x 2)     Status: None (Preliminary result)   Collection Time: 09/29/24  2:37 PM   Specimen: BLOOD  Result Value Ref Range Status   Specimen Description   Final    BLOOD BLOOD RIGHT FOREARM Performed at [facility], [address] [place], [place] [address]    Special Requests   Final    BOTTLES DRAWN AEROBIC AND ANAEROBIC Blood Culture results may not be optimal due to an inadequate volume of blood received in culture bottles Performed at [facility], [address] [place], [place] [address]    Culture  Setup Time   Final    GRAM POSITIVE COCCI IN BOTH AEROBIC AND ANAEROBIC BOTTLES CRITICAL VALUE NOTED.  VALUE IS CONSISTENT WITH PREVIOUSLY REPORTED AND CALLED VALUE. Performed at [facility], [address], [place], [place] [address]    Culture GRAM POSITIVE COCCI  Final   Report Status PENDING  Incomplete  Blood Culture (routine x 2)     Status: None (Preliminary result)   Collection  Time: 09/29/24  2:37 PM   Specimen: BLOOD  Result Value Ref Range Status   Specimen Description   Final    BLOOD LEFT ANTECUBITAL Performed at [facility], [address] [place], [place] [address]    Special Requests   Final    BOTTLES DRAWN AEROBIC AND ANAEROBIC Blood Culture results may not be optimal due to an inadequate volume of blood received in culture bottles Performed at [facility], [address] [place], [place] [address]    Culture  Setup Time   Final    GRAM POSITIVE COCCI IN BOTH AEROBIC AND ANAEROBIC BOTTLES CRITICAL RESULT CALLED TO, READ BACK BY AND VERIFIED WITH: PHARMD NATHAN B ON [id number] @0457  BY HNM Performed at [facility], [address], [place], [place] [address]    Culture GRAM POSITIVE COCCI  Final   Report Status PENDING  Incomplete  Blood Culture ID Panel (Reflexed)     Status: Abnormal   Collection Time: 09/29/24  2:37 PM  Result Value Ref Range Status   Enterococcus faecalis NOT DETECTED NOT DETECTED Final   Enterococcus Faecium NOT DETECTED NOT DETECTED Final   Listeria monocytogenes NOT DETECTED NOT DETECTED Final   Staphylococcus species DETECTED (A) NOT DETECTED Final    Comment: CRITICAL RESULT CALLED TO, READ BACK BY AND VERIFIED WITHBETHA RANKIN DILLS PHARMD [id number] 09/30/24 HNM    Staphylococcus aureus (BCID) DETECTED (A) NOT DETECTED Final    Comment: Methicillin (oxacillin)-resistant Staphylococcus aureus (MRSA). MRSA is predictably resistant to beta-lactam antibiotics (except ceftaroline). Preferred therapy is vancomycin  unless clinically contraindicated. Patient requires contact precautions if  hospitalized. CRITICAL RESULT CALLED TO, READ BACK BY AND VERIFIED WITHBETHA RANKIN DILLS PHARMD [id number] 09/30/24 HNM  Staphylococcus epidermidis NOT DETECTED NOT DETECTED Final   Staphylococcus lugdunensis NOT DETECTED NOT DETECTED Final   Streptococcus species NOT DETECTED NOT DETECTED Final   Streptococcus agalactiae NOT  DETECTED NOT DETECTED Final   Streptococcus pneumoniae NOT DETECTED NOT DETECTED Final   Streptococcus pyogenes NOT DETECTED NOT DETECTED Final   A.calcoaceticus-baumannii NOT DETECTED NOT DETECTED Final   Bacteroides fragilis NOT DETECTED NOT DETECTED Final   Enterobacterales NOT DETECTED NOT DETECTED Final   Enterobacter cloacae complex NOT DETECTED NOT DETECTED Final   Escherichia coli NOT DETECTED NOT DETECTED Final   Klebsiella aerogenes NOT DETECTED NOT DETECTED Final   Klebsiella oxytoca NOT DETECTED NOT DETECTED Final   Klebsiella pneumoniae NOT DETECTED NOT DETECTED Final   Proteus species NOT DETECTED NOT DETECTED Final   Salmonella species NOT DETECTED NOT DETECTED Final   Serratia marcescens NOT DETECTED NOT DETECTED Final   Haemophilus influenzae NOT DETECTED NOT DETECTED Final   Neisseria meningitidis NOT DETECTED NOT DETECTED Final   Pseudomonas aeruginosa NOT DETECTED NOT DETECTED Final   Stenotrophomonas maltophilia NOT DETECTED NOT DETECTED Final   Candida albicans NOT DETECTED NOT DETECTED Final   Candida auris NOT DETECTED NOT DETECTED Final   Candida glabrata NOT DETECTED NOT DETECTED Final   Candida krusei NOT DETECTED NOT DETECTED Final   Candida parapsilosis NOT DETECTED NOT DETECTED Final   Candida tropicalis NOT DETECTED NOT DETECTED Final   Cryptococcus neoformans/gattii NOT DETECTED NOT DETECTED Final   Meth resistant mecA/C and MREJ DETECTED (A) NOT DETECTED Final    Comment: CRITICAL RESULT CALLED TO, READ BACK BY AND VERIFIED WITHBETHA RANKIN DILLS PHARMD [id number] 09/30/24 HNM Performed at [facility], [address] [place], [place] [address]      IMAGING RESULTS: Xray abdomen Dilated loops of intestine  I have personally reviewed the films  CXR ?  Cardiomegaly B/l pleural effusion Central catheter  Impression/Recommendation MRSA bacteremia Pt has HD catheter- that needs to removed Will need 2 d echo and TEE to r/o endocarditis On  vancomycin  Will need line holiday and clearance of bacteremia before new HD access can be placed  H/o recurrent MRSA bacteremia H/o vertebral osteomyelitis and spine infection due to MRSA- has extensive thoracic hardware  DM  B/l BKA  ESRD on Hemodialysis  Neurogenic bladder- has a suprapubic catheter  Anemia  Hepatitis C - not sure whether he wa streated- will check VL   Constipation of > 1 week with nausea and xray showed intestinal distension [place] has stercoral colitis  This consult involved complex antimicrobial management  Discussed the management with the patient, hosptialist and ID pharmacist   ?.    ________________________________________________      [1] No Known Allergies

## 2024-09-30 NOTE — Plan of Care (Signed)
  Problem: Education: Goal: Ability to describe self-care measures that may prevent or decrease complications (Diabetes Survival Skills Education) will improve Outcome: Progressing Goal: Individualized Educational Video(s) Outcome: Progressing   Problem: Coping: Goal: Ability to adjust to condition or change in health will improve Outcome: Progressing   Problem: Fluid Volume: Goal: Ability to maintain a balanced intake and output will improve Outcome: Progressing   Problem: Health Behavior/Discharge Planning: Goal: Ability to identify and utilize available resources and services will improve Outcome: Progressing Goal: Ability to manage health-related needs will improve Outcome: Progressing   Problem: Metabolic: Goal: Ability to maintain appropriate glucose levels will improve Outcome: Progressing   Problem: Nutritional: Goal: Progress toward achieving an optimal weight will improve Outcome: Progressing   Problem: Skin Integrity: Goal: Risk for impaired skin integrity will decrease Outcome: Progressing   Problem: Tissue Perfusion: Goal: Adequacy of tissue perfusion will improve Outcome: Progressing   Problem: Education: Goal: Knowledge of General Education information will improve Description: Including pain rating scale, medication(s)/side effects and non-pharmacologic comfort measures Outcome: Progressing   Problem: Health Behavior/Discharge Planning: Goal: Ability to manage health-related needs will improve Outcome: Progressing   Problem: Clinical Measurements: Goal: Ability to maintain clinical measurements within normal limits will improve Outcome: Progressing Goal: Will remain free from infection Outcome: Progressing Goal: Diagnostic test results will improve Outcome: Progressing Goal: Respiratory complications will improve Outcome: Progressing Goal: Cardiovascular complication will be avoided Outcome: Progressing   Problem: Activity: Goal: Risk for  activity intolerance will decrease Outcome: Progressing   Problem: Nutrition: Goal: Adequate nutrition will be maintained Outcome: Progressing   Problem: Coping: Goal: Level of anxiety will decrease Outcome: Progressing   Problem: Elimination: Goal: Will not experience complications related to bowel motility Outcome: Progressing Goal: Will not experience complications related to urinary retention Outcome: Progressing   Problem: Pain Managment: Goal: General experience of comfort will improve and/or be controlled Outcome: Progressing   Problem: Safety: Goal: Ability to remain free from injury will improve Outcome: Progressing   Problem: Skin Integrity: Goal: Risk for impaired skin integrity will decrease Outcome: Progressing

## 2024-09-30 NOTE — H&P (View-Only) (Signed)
 NAME: Eugene [name]  DOB: 02/12/1961  MRN: [id number]  Date/Time: 09/30/2024 12:46 PM  REQUESTING PROVIDER:AUTO consullt Subjective:  REASON FOR CONSULT: MRSA bacteremia ? Eugene [name] is a 64 y.o. 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, , osteomyelitis of vertebrae and  2 prolonged MRSA bacteremia and spinal osteomyelitis with epidural abscess  underwent T8-T9 laminectomy T8-T9 transpedicular corpectomies and T5-T11 posterolateral arthrodesis, ESRD on HD , suprapubic catheter, HTN, DM, HLD presented with sob for [facility] HE was also nauseous Vitals in the ED Temp 100.4 BP 132/67 HR 102 Sats 90%   Latest Reference Range & Units 09/29/24 14:37  WBC 4.0 - 10.5 K/uL 26.4 (H)  Hemoglobin 13.0 - 17.0 g/dL 88.4 (L)  HCT 60.9 - 47.9 % 36.1 (L)  Platelets 150 - 400 K/uL 139 (L)  Creatinine 0.61 - 1.24 mg/dL 5.62 (H)    Blood culture sent CXR Cardiomegaly, left pleural effusion \\CT  abdomen and pelvis rectosigmoid wall thickeneing  Constipated for more than a week I am seeing the patient as the blood culture came back as MRSA HE has a dialysis cath on the Rt subclavian   Past Medical History:  Diagnosis Date   Anxiety    Depression    Diabetes mellitus without complication (HCC)    End stage renal disease (HCC)    Hepatitis C    Hyperlipidemia    IBS (irritable bowel syndrome)    Osteomyelitis (HCC)    Spinal stenosis     Past Surgical History:  Procedure Laterality Date   BACK SURGERY     bilateral amputation Bilateral    BKA Bilateral    CENTRAL LINE INSERTION-TUNNELED N/A 02/02/2019   Procedure: CENTRAL LINE INSERTION-TUNNELED;  Surgeon: Marea Selinda RAMAN, MD;  Location: [facility];  Service: Cardiovascular;  Laterality: N/A;   COLONOSCOPY WITH PROPOFOL  N/A 01/12/2020   Procedure: COLONOSCOPY WITH PROPOFOL ;  Surgeon: Jinny Carmine, MD;  Location: [facility];  Service: Endoscopy;  Laterality: N/A;    DIALYSIS/PERMA CATHETER INSERTION N/A 05/22/2024   Procedure: DIALYSIS/PERMA CATHETER INSERTION;  Surgeon: Jama Cordella MATSU, MD;  Location: [facility];  Service: Cardiovascular;  Laterality: N/A;   DIALYSIS/PERMA CATHETER INSERTION N/A 07/21/2024   Procedure: DIALYSIS/PERMA CATHETER INSERTION;  Surgeon: Marea Selinda RAMAN, MD;  Location: [facility];  Service: Cardiovascular;  Laterality: N/A;   ESOPHAGOGASTRODUODENOSCOPY (EGD) WITH PROPOFOL  N/A 12/07/2019   Procedure: ESOPHAGOGASTRODUODENOSCOPY (EGD) WITH PROPOFOL ;  Surgeon: Unk Corinn Skiff, MD;  Location: [facility];  Service: Gastroenterology;  Laterality: N/A;   IR CATHETER TUBE CHANGE  12/04/2019   SPINAL FUSION     THORACIC SPINE SURGERY  01/2019   extensive washout    Social History   Socioeconomic History   Marital status: Single    Spouse name: Not on file   Number of children: Not on file   Years of education: Not on file   Highest education level: Not on file  Occupational History   Not on file  Tobacco Use   Smoking status: Former    Current packs/day: 0.25    Types: Cigarettes   Smokeless tobacco: Never  Vaping Use   Vaping status: Every Day   Substances: Nicotine   Substance and Sexual Activity   Alcohol use: Not Currently   Drug use: Not Currently   Sexual activity: Not Currently  Other Topics Concern   Not on file  Social History Narrative   Resides at [employer]  Social Drivers of Health   Tobacco Use: Medium Risk (09/29/2024)   Patient History    Smoking Tobacco Use: Former    Smokeless Tobacco Use: Never    Passive Exposure: Not on Actuary Strain: Not on file  Food Insecurity: No Food Insecurity (09/29/2024)   Epic    Worried About Programme Researcher, Broadcasting/film/video in the Last Year: Never true    Ran Out of Food in the Last Year: Never true  Transportation Needs: No Transportation Needs (09/29/2024)   Epic    Lack of Transportation (Medical): No    Lack of  Transportation (Non-Medical): No  Physical Activity: Not on file  Stress: Not on file  Social Connections: Socially Isolated (09/29/2024)   Social Connection and Isolation Panel    Frequency of Communication with Friends and Family: More than three times a week    Frequency of Social Gatherings with Friends and Family: Once a week    Attends Religious Services: Never    Database Administrator or Organizations: No    Attends Banker Meetings: Never    Marital Status: Never married  Intimate Partner Violence: Not At Risk (09/29/2024)   Epic    Fear of Current or Ex-Partner: No    Emotionally Abused: No    Physically Abused: No    Sexually Abused: No  Depression (PHQ2-9): Not on file  Alcohol Screen: Not on file  Housing: Low Risk (09/29/2024)   Epic    Unable to Pay for Housing in the Last Year: No    Number of Times Moved in the Last Year: 0    Homeless in the Last Year: No  Utilities: Not At Risk (09/29/2024)   Epic    Threatened with loss of utilities: No  Health Literacy: Not on file    Family History  Problem Relation Age of Onset   Kidney failure Father    Kidney failure Brother    Allergies[1] I? Current Facility-Administered Medications  Medication Dose Route Frequency Provider Last Rate Last Admin   acetaminophen  (TYLENOL ) tablet 650 mg  650 mg Oral Q6H PRN Josette Ade, MD       Or   acetaminophen  (TYLENOL ) suppository 650 mg  650 mg Rectal Q6H PRN Wieting, Richard, MD       alteplase  (CATHFLO ACTIVASE ) injection 2 mg  2 mg Intracatheter Once PRN Druscilla Bald, NP       alteplase  (CATHFLO ACTIVASE ) injection 2 mg  2 mg Intracatheter Once PRN Druscilla Bald, NP       alteplase  (CATHFLO ACTIVASE ) injection 2 mg  2 mg Intracatheter Once PRN Druscilla Bald, NP       alteplase  (CATHFLO ACTIVASE ) injection 2 mg  2 mg Intracatheter Once PRN Druscilla Bald, NP       bisacodyl  (DULCOLAX) EC tablet 10 mg  10 mg Oral QHS Wieting, Richard, MD        bisacodyl  (DULCOLAX) suppository 10 mg  10 mg Rectal Daily PRN Wieting, Richard, MD       carvedilol  (COREG ) tablet 6.25 mg  6.25 mg Oral BID WC Wieting, Richard, MD       Chlorhexidine  Gluconate Cloth 2 % PADS 6 each  6 each Topical Q0600 Druscilla Bald, NP   6 each at 09/30/24 0533   cyanocobalamin  (VITAMIN B12) tablet 1,000 mcg  1,000 mcg Oral Daily Josette Ade, MD   1,000 mcg at 09/30/24 0948   diltiazem  (CARDIZEM  CD) 24 hr capsule 240 mg  240  mg Oral QPM Josette Ade, MD   240 mg at 09/29/24 2244   doxepin  (SINEQUAN ) capsule 10 mg  10 mg Oral QHS Wieting, Richard, MD       famotidine  (PEPCID ) tablet 10 mg  10 mg Oral Daily Josette Ade, MD   10 mg at 09/30/24 9051   folic acid  (FOLVITE ) tablet 1 mg  1 mg Oral Daily Wieting, Richard, MD   1 mg at 09/30/24 9051   gabapentin  (NEURONTIN ) capsule 300 mg  300 mg Oral QHS Wieting, Richard, MD       guaiFENesin  (ROBITUSSIN) 100 MG/5ML liquid 200 mg  200 mg Oral TID PRN Josette Ade, MD       heparin  injection 1,000 Units  1,000 Units Intracatheter PRN Druscilla Bald, NP       heparin  injection 1,000 Units  1,000 Units Intracatheter PRN Druscilla Bald, NP       heparin  injection 1,000 Units  1,000 Units Intracatheter PRN Druscilla Bald, NP       heparin  injection 1,000 Units  1,000 Units Intracatheter PRN Druscilla Bald, NP   1,000 Units at 09/30/24 1124   heparin  injection 5,000 Units  5,000 Units Subcutaneous Q8H Josette Ade, MD   5,000 Units at 09/29/24 2256   insulin  aspart (novoLOG ) injection 0-5 Units  0-5 Units Subcutaneous QHS Wieting, Richard, MD       insulin  aspart (novoLOG ) injection 0-6 Units  0-6 Units Subcutaneous TID WC Josette Ade, MD   4 Units at 09/30/24 9048   insulin  glargine (LANTUS ) injection 10 Units  10 Units Subcutaneous QHS Josette Ade, MD   10 Units at 09/29/24 2245   ipratropium-albuterol  (DUONEB) 0.5-2.5 (3) MG/3ML nebulizer solution 3 mL  3 mL Nebulization Q6H PRN Josette Ade, MD   3 mL at 09/30/24 1224   lactulose  (CHRONULAC ) 10 GM/15ML solution 20 g  20 g Oral BID Josette Ade, MD   20 g at 09/29/24 2257   levothyroxine  (SYNTHROID ) tablet 175 mcg  175 mcg Oral QHS Josette Ade, MD   175 mcg at 09/29/24 2259   linaclotide  (LINZESS ) capsule 145 mcg  145 mcg Oral QAC breakfast Josette Ade, MD   145 mcg at 09/30/24 0957   liver oil-zinc  oxide (DESITIN) 40 % ointment 1 Application  1 Application Topical PRN Wieting, Richard, MD       loratadine  (CLARITIN ) tablet 10 mg  10 mg Oral Daily Josette Ade, MD   10 mg at 09/30/24 9051   midodrine  (PROAMATINE ) tablet 5 mg  5 mg Oral TID WC Wieting, Richard, MD   5 mg at 09/30/24 9048   ondansetron  (ZOFRAN ) tablet 4 mg  4 mg Oral Q6H PRN Josette Ade, MD       Or   ondansetron  (ZOFRAN ) injection 4 mg  4 mg Intravenous Q6H PRN Josette Ade, MD   4 mg at 09/30/24 9057   oxyCODONE  (Oxy IR/ROXICODONE ) immediate release tablet 7.5 mg  7.5 mg Oral Q6H PRN Josette Ade, MD   7.5 mg at 09/30/24 1103   polyethylene glycol (MIRALAX  / GLYCOLAX ) packet 17 g  17 g Oral BID Josette Ade, MD   17 g at 09/29/24 2259   senna-docusate (Senokot-S) tablet 1 tablet  1 tablet Oral BID Josette Ade, MD   1 tablet at 09/30/24 0948   simethicone  (MYLICON) chewable tablet 80 mg  80 mg Oral QID Josette Ade, MD   80 mg at 09/30/24 0948   vancomycin  variable dose per unstable renal function (pharmacist dosing)  Does not apply See admin instructions Josette Ade, MD         Abtx:  Anti-infectives (From admission, onward)    Start     Dose/Rate Route Frequency Ordered Stop   09/30/24 1500  ceFEPIme  (MAXIPIME ) 2 g in sodium chloride  0.9 % 100 mL IVPB  Status:  Discontinued        2 g 200 mL/hr over 30 Minutes Intravenous Every 24 hours 09/29/24 1600 09/30/24 0541   09/29/24 1700  vancomycin  (VANCOREADY) IVPB 2000 mg/400 mL        2,000 mg 200 mL/hr over 120 Minutes Intravenous Once 09/29/24 1603  09/30/24 0036   09/29/24 1611  vancomycin  variable dose per unstable renal function (pharmacist dosing)         Does not apply See admin instructions 09/29/24 1611     09/29/24 1430  ceFEPIme  (MAXIPIME ) 2 g in sodium chloride  0.9 % 100 mL IVPB        2 g 200 mL/hr over 30 Minutes Intravenous  Once 09/29/24 1416 09/29/24 1523   09/29/24 1430  metroNIDAZOLE  (FLAGYL ) IVPB 500 mg        500 mg 100 mL/hr over 60 Minutes Intravenous  Once 09/29/24 1416 09/29/24 1641   09/29/24 1430  vancomycin  (VANCOCIN ) IVPB 1000 mg/200 mL premix  Status:  Discontinued        1,000 mg 200 mL/hr over 60 Minutes Intravenous  Once 09/29/24 1416 09/29/24 1603       REVIEW OF SYSTEMS:  Const:  fever, negative chills, negative weight loss Eyes: negative diplopia or visual changes, negative eye pain ENT: negative coryza, negative sore throat Resp: negative cough, hemoptysis, has dyspnea Cards: negative for chest pain, palpitations, lower extremity edema GU: suprapubic catheter GI: nausea, constipated > 1 week, abdominal distension  Skin: negative for rash and pruritus Heme: negative for easy bruising and gum/nose bleeding MS:  weakness Neurolo:negative for headaches, dizziness, vertigo, memory problems  Psych: anxiety, depression  Endocrine:  diabetes Allergy/Immunology- negative for any medication or food allergies ? Pertinent Positives include : Objective:  VITALS:  BP (!) 177/77 (BP Location: Right Arm)   Pulse 84   Temp 97.7 F (36.5 C)   Resp 20   Ht 5' 10 (1.778 m)   Wt 123.6 kg   SpO2 95%   BMI 39.10 kg/m   PHYSICAL EXAM:  General: Alert, some distress,pale Head: Normocephalic, without obvious abnormality, atraumatic. Eyes: Conjunctivae clear, anicteric sclerae. Pupils are equal ENT Nares normal. No drainage or sinus tenderness. Lips, mucosa, and tongue normal. No Thrush Neck: , symmetrical, no adenopathy, thyroid: non tender no carotid bruit and no JVD. Back: No CVA  tenderness. Lungs: Clear to auscultation bilaterally. No Wheezing or Rhonchi. No rales. Heart: Regular rate and rhythm, no murmur, rub or gallop. Abdomen:  distended. Bowel sounds normal. No masses Supra pubic catheter Extremities: b/l BKA Skin: No rashes or lesions. Or bruising Lymph: Cervical, supraclavicular normal. Neurologic: Grossly non-focal Pertinent Labs Lab Results CBC    Component Value Date/Time   WBC 16.8 (H) 09/30/2024 0425   RBC 3.37 (L) 09/30/2024 0425   HGB 9.9 (L) 09/30/2024 0425   HCT 32.7 (L) 09/30/2024 0425   PLT 118 (L) 09/30/2024 0425   MCV 97.0 09/30/2024 0425   MCH 29.4 09/30/2024 0425   MCHC 30.3 09/30/2024 0425   RDW 14.5 09/30/2024 0425   LYMPHSABS 0.7 09/29/2024 1437   MONOABS 1.9 (H) 09/29/2024 1437   EOSABS 0.0 09/29/2024 1437   BASOSABS 0.1  09/29/2024 1437       Latest Ref Rng & Units 09/30/2024    7:27 AM 09/29/2024    2:37 PM 08/05/2024    9:00 AM  CMP  Glucose 70 - 99 mg/dL 601  743  807   BUN 8 - 23 mg/dL 70  60  32   Creatinine 0.61 - 1.24 mg/dL 5.18  5.62  6.53   Sodium 135 - 145 mmol/L 136  139  137   Potassium 3.5 - 5.1 mmol/L 5.1  4.3  4.6   Chloride 98 - 111 mmol/L 101  102  103   CO2 22 - 32 mmol/L 22  24  27    Calcium  8.9 - 10.3 mg/dL 8.2  8.3  8.0   Total Protein 6.5 - 8.1 g/dL  6.5    Total Bilirubin 0.0 - 1.2 mg/dL  0.4    Alkaline Phos 38 - 126 U/L  83    AST 15 - 41 U/L  21    ALT 0 - 44 U/L  28        Microbiology: Recent Results (from the past 240 hours)  Resp panel by RT-PCR (RSV, Flu A&B, Covid) Anterior Nasal Swab     Status: None   Collection Time: 09/29/24  2:16 PM   Specimen: Anterior Nasal Swab  Result Value Ref Range Status   SARS Coronavirus 2 by RT PCR NEGATIVE NEGATIVE Final    Comment: (NOTE) SARS-CoV-2 target nucleic acids are NOT DETECTED.  The SARS-CoV-2 RNA is generally detectable in upper respiratory specimens during the acute phase of infection. The lowest concentration of SARS-CoV-2 viral  copies this assay can detect is 138 copies/mL. A negative result does not preclude SARS-Cov-2 infection and should not be used as the sole basis for treatment or other patient management decisions. A negative result may occur with  improper specimen collection/handling, submission of specimen other than nasopharyngeal swab, presence of viral mutation(s) within the areas targeted by this assay, and inadequate number of viral copies(<138 copies/mL). A negative result must be combined with clinical observations, patient history, and epidemiological information. The expected result is Negative.  Fact Sheet for Patients:  bloggercourse.com  Fact Sheet for Healthcare Providers:  seriousbroker.it  This test is no t yet approved or cleared by the [place]  FDA and  has been authorized for detection and/or diagnosis of SARS-CoV-2 by FDA under an Emergency Use Authorization (EUA). This EUA will remain  in effect (meaning this test can be used) for the duration of the COVID-19 declaration under Section 564(b)(1) of the Act, 21 U.S.C.section 360bbb-3(b)(1), unless the authorization is terminated  or revoked sooner.       Influenza A by PCR NEGATIVE NEGATIVE Final   Influenza B by PCR NEGATIVE NEGATIVE Final    Comment: (NOTE) The Xpert Xpress SARS-CoV-2/FLU/RSV plus assay is intended as an aid in the diagnosis of influenza from Nasopharyngeal swab specimens and should not be used as a sole basis for treatment. Nasal washings and aspirates are unacceptable for Xpert Xpress SARS-CoV-2/FLU/RSV testing.  Fact Sheet for Patients: bloggercourse.com  Fact Sheet for Healthcare Providers: seriousbroker.it  This test is not yet approved or cleared by the [place]  FDA and has been authorized for detection and/or diagnosis of SARS-CoV-2 by FDA under an Emergency Use Authorization (EUA). This  EUA will remain in effect (meaning this test can be used) for the duration of the COVID-19 declaration under Section 564(b)(1) of the Act, 21 U.S.C. section 360bbb-3(b)(1), unless the authorization  is terminated or revoked.     Resp Syncytial Virus by PCR NEGATIVE NEGATIVE Final    Comment: (NOTE) Fact Sheet for Patients: bloggercourse.com  Fact Sheet for Healthcare Providers: seriousbroker.it  This test is not yet approved or cleared by the [place]  FDA and has been authorized for detection and/or diagnosis of SARS-CoV-2 by FDA under an Emergency Use Authorization (EUA). This EUA will remain in effect (meaning this test can be used) for the duration of the COVID-19 declaration under Section 564(b)(1) of the Act, 21 U.S.C. section 360bbb-3(b)(1), unless the authorization is terminated or revoked.  Performed at [facility], [address], [place], [place] [address]   Blood Culture (routine x 2)     Status: None (Preliminary result)   Collection Time: 09/29/24  2:37 PM   Specimen: BLOOD  Result Value Ref Range Status   Specimen Description   Final    BLOOD BLOOD RIGHT FOREARM Performed at [facility], [address] [place], [place] [address]    Special Requests   Final    BOTTLES DRAWN AEROBIC AND ANAEROBIC Blood Culture results may not be optimal due to an inadequate volume of blood received in culture bottles Performed at [facility], [address] [place], [place] [address]    Culture  Setup Time   Final    GRAM POSITIVE COCCI IN BOTH AEROBIC AND ANAEROBIC BOTTLES CRITICAL VALUE NOTED.  VALUE IS CONSISTENT WITH PREVIOUSLY REPORTED AND CALLED VALUE. Performed at [facility], [address], [place], [place] [address]    Culture GRAM POSITIVE COCCI  Final   Report Status PENDING  Incomplete  Blood Culture (routine x 2)     Status: None (Preliminary result)   Collection  Time: 09/29/24  2:37 PM   Specimen: BLOOD  Result Value Ref Range Status   Specimen Description   Final    BLOOD LEFT ANTECUBITAL Performed at [facility], [address] [place], [place] [address]    Special Requests   Final    BOTTLES DRAWN AEROBIC AND ANAEROBIC Blood Culture results may not be optimal due to an inadequate volume of blood received in culture bottles Performed at [facility], [address] [place], [place] [address]    Culture  Setup Time   Final    GRAM POSITIVE COCCI IN BOTH AEROBIC AND ANAEROBIC BOTTLES CRITICAL RESULT CALLED TO, READ BACK BY AND VERIFIED WITH: PHARMD NATHAN B ON [id number] @0457  BY HNM Performed at [facility], [address], [place], [place] [address]    Culture GRAM POSITIVE COCCI  Final   Report Status PENDING  Incomplete  Blood Culture ID Panel (Reflexed)     Status: Abnormal   Collection Time: 09/29/24  2:37 PM  Result Value Ref Range Status   Enterococcus faecalis NOT DETECTED NOT DETECTED Final   Enterococcus Faecium NOT DETECTED NOT DETECTED Final   Listeria monocytogenes NOT DETECTED NOT DETECTED Final   Staphylococcus species DETECTED (A) NOT DETECTED Final    Comment: CRITICAL RESULT CALLED TO, READ BACK BY AND VERIFIED WITHBETHA RANKIN DILLS PHARMD [id number] 09/30/24 HNM    Staphylococcus aureus (BCID) DETECTED (A) NOT DETECTED Final    Comment: Methicillin (oxacillin)-resistant Staphylococcus aureus (MRSA). MRSA is predictably resistant to beta-lactam antibiotics (except ceftaroline). Preferred therapy is vancomycin  unless clinically contraindicated. Patient requires contact precautions if  hospitalized. CRITICAL RESULT CALLED TO, READ BACK BY AND VERIFIED WITHBETHA RANKIN DILLS PHARMD [id number] 09/30/24 HNM  Staphylococcus epidermidis NOT DETECTED NOT DETECTED Final   Staphylococcus lugdunensis NOT DETECTED NOT DETECTED Final   Streptococcus species NOT DETECTED NOT DETECTED Final   Streptococcus agalactiae NOT  DETECTED NOT DETECTED Final   Streptococcus pneumoniae NOT DETECTED NOT DETECTED Final   Streptococcus pyogenes NOT DETECTED NOT DETECTED Final   A.calcoaceticus-baumannii NOT DETECTED NOT DETECTED Final   Bacteroides fragilis NOT DETECTED NOT DETECTED Final   Enterobacterales NOT DETECTED NOT DETECTED Final   Enterobacter cloacae complex NOT DETECTED NOT DETECTED Final   Escherichia coli NOT DETECTED NOT DETECTED Final   Klebsiella aerogenes NOT DETECTED NOT DETECTED Final   Klebsiella oxytoca NOT DETECTED NOT DETECTED Final   Klebsiella pneumoniae NOT DETECTED NOT DETECTED Final   Proteus species NOT DETECTED NOT DETECTED Final   Salmonella species NOT DETECTED NOT DETECTED Final   Serratia marcescens NOT DETECTED NOT DETECTED Final   Haemophilus influenzae NOT DETECTED NOT DETECTED Final   Neisseria meningitidis NOT DETECTED NOT DETECTED Final   Pseudomonas aeruginosa NOT DETECTED NOT DETECTED Final   Stenotrophomonas maltophilia NOT DETECTED NOT DETECTED Final   Candida albicans NOT DETECTED NOT DETECTED Final   Candida auris NOT DETECTED NOT DETECTED Final   Candida glabrata NOT DETECTED NOT DETECTED Final   Candida krusei NOT DETECTED NOT DETECTED Final   Candida parapsilosis NOT DETECTED NOT DETECTED Final   Candida tropicalis NOT DETECTED NOT DETECTED Final   Cryptococcus neoformans/gattii NOT DETECTED NOT DETECTED Final   Meth resistant mecA/C and MREJ DETECTED (A) NOT DETECTED Final    Comment: CRITICAL RESULT CALLED TO, READ BACK BY AND VERIFIED WITHBETHA RANKIN DILLS PHARMD [id number] 09/30/24 HNM Performed at [facility], [address] [place], [place] [address]      IMAGING RESULTS: Xray abdomen Dilated loops of intestine  I have personally reviewed the films  CXR ?  Cardiomegaly B/l pleural effusion Central catheter  Impression/Recommendation MRSA bacteremia Pt has HD catheter- that needs to removed Will need 2 d echo and TEE to r/o endocarditis On  vancomycin  Will need line holiday and clearance of bacteremia before new HD access can be placed  H/o recurrent MRSA bacteremia H/o vertebral osteomyelitis and spine infection due to MRSA- has extensive thoracic hardware  DM  B/l BKA  ESRD on Hemodialysis  Neurogenic bladder- has a suprapubic catheter  Anemia  Hepatitis C - not sure whether he wa streated- will check VL   Constipation of > 1 week with nausea and xray showed intestinal distension [place] has stercoral colitis  This consult involved complex antimicrobial management  Discussed the management with the patient, hosptialist and ID pharmacist   ?.    ________________________________________________      [1] No Known Allergies

## 2024-09-30 NOTE — Progress Notes (Signed)
 " Progress Note   Patient: Eugene [name] FMW:[id number] DOB: 06-Oct-1960 DOA: 09/29/2024     1 DOS: the patient was seen and examined on 09/30/2024   Brief hospital course: HPI: Eugene [name] is a 64 y.o. male with medical history significant of anxiety, depression, type 2 diabetes mellitus, history of hepatitis C, end-stage renal disease on dialysis, bilateral BKA.  Patient known to most historian.  States he is short of breath.  He is sick to his stomach.  He vomited.  No diarrhea.  He is short of breath.  He is having a fever.  In the ER he was found to have an elevated white blood cell count and started on sepsis protocol.  Respiratory panel pending, chest x-ray shows mild interstitial edema and left greater than right pleural effusion.  Urine looks positive and patient has a suprapubic catheter.  Hospitalist services contacted for further evaluation.   Assessment and Plan: *Sepsis secondary to MRSA bacteremia MRSA positive in 4 out of 4 bottles. Present on admission.  Sepsis criteria fever leukocytosis and tachycardia.   Continue vancomycin  Will need hemodialysis catheter removed   Abdominal discomfort Continues to feel nauseous and have episodes of emesis.  No diarrhea.  CT of the abdomen and pelvis with contrast notable for proctocolitis. --Continue symptomatic support   ESRD (end stage renal disease) (HCC) Hemodialysis per nephrology  Paroxysmal SVT (supraventricular tachycardia) On Cardizem  and Coreg   Heart failure with preserved ejection fraction (HCC) Dialysis to manage fluid  Chronic respiratory failure with hypoxia (HCC) On 6 L  HTN Hypertensive, likely acutely worsened due to patient being uncomfortable.  -Hold midodrine .   Chronic bilateral pleural effusions Left greater than right.  Hd.   Chronic pain On oxycodone   Uncontrolled type 2 diabetes mellitus with hyperglycemia, with long-term current use of insulin  (HCC) A1c 7.3 though ESRD. Continue Lantus  and  sliding scale  Chronic constipation Patient on numerous medications for constipation.   Obesity (BMI 30-39.9) BMI 37.64  Hypothyroidism On levothyroxine        Subjective: Patient having some nausea and two episodes of emesis. Otherwise no acute complaints.   Physical Exam: Vitals:   09/30/24 1102 09/30/24 1151 09/30/24 1225 09/30/24 1230  BP: (!) 163/73 (!) 169/86  (!) 177/77  Pulse: 72 88  84  Resp: 18 (!) 27  20  Temp:    97.7 F (36.5 C)  TempSrc:      SpO2: 93% (!) 85% 95% 95%  Weight:      Height:       Physical Exam  Constitutional: Uncomfortable.  Cardiovascular: Normal rate, regular rhythm. No lower extremity edema  Pulmonary: Non labored breathing on room air, no wheezing or rales.   Abdominal: Soft. Non distended and non tender Musculoskeletal: s/p bilateral BKAs Neurological: Alert and oriented to person, place, and time. Non focal  Skin: Skin is warm and dry.   Data Reviewed:     Latest Ref Rng & Units 09/30/2024    7:27 AM 09/29/2024    2:37 PM 08/05/2024    9:00 AM  BMP  Glucose 70 - 99 mg/dL 601  743  807   BUN 8 - 23 mg/dL 70  60  32   Creatinine 0.61 - 1.24 mg/dL 5.18  5.62  6.53   Sodium 135 - 145 mmol/L 136  139  137   Potassium 3.5 - 5.1 mmol/L 5.1  4.3  4.6   Chloride 98 - 111 mmol/L 101  102  103   CO2 22 -  32 mmol/L 22  24  27    Calcium  8.9 - 10.3 mg/dL 8.2  8.3  8.0       Latest Ref Rng & Units 09/30/2024    4:25 AM 09/29/2024    2:37 PM 08/05/2024    9:00 AM  CBC  WBC 4.0 - 10.5 K/uL 16.8  26.4  5.8   Hemoglobin 13.0 - 17.0 g/dL 9.9  88.4  89.6   Hematocrit 39.0 - 52.0 % 32.7  36.1  33.2   Platelets 150 - 400 K/uL 118  139  174      Family Communication: None at bedside.   Disposition: Status is: Inpatient Remains inpatient appropriate because: MRSA bacteremia   Planned Discharge Destination: Pending clinical course     Time spent: 35 minutes  Author: Alban Pepper, MD 09/30/2024 1:26 PM  For on call review  [email or web address].  "

## 2024-09-30 NOTE — Consult Note (Addendum)
 Pharmacy Antibiotic Note  Eugene [name] is a 64 y.o. male with a PMH of ESRD on HD MWF, HTN, DM, HLD and anxiety is admitted on 09/29/2024 with sepsis  and SOB. Pharmacy has been consulted for vancomycin   dosing.  Today, 09/30/2024 Day #1 vancomycin  Renal: ESRD on HD - MWF WBC 14.8 Tmax 100.4 1/6 blood cx: 4/4 GPC, BCID MRSA Urine cx pending  Vancomycin  dosing and levels:  Vancomycin  2gm IV x 1 1/6 at 2236   Plan: Patient receiving HD today,  will plan to give vancomycin  1gm after HD and follow-up plan.  Will await to see if plan is for line removal and line holiday based on MRSA bacteremia.   Will order random levels and schedule doses as indicated Will follow nephrology to schedule doses and monitor cultures to guide antibiotic therapy. Await ID evaluation   Height: 5' 10 (177.8 cm) Weight: 123.6 kg (272 lb 7.8 oz) IBW/kg (Calculated) : 73  Temp (24hrs), Avg:99 F (37.2 C), Min:98.2 F (36.8 C), Max:100.4 F (38 C)  Recent Labs  Lab 09/29/24 1437 09/29/24 2006 09/30/24 0425 09/30/24 0727  WBC 26.4*  --  16.8*  --   CREATININE 4.37*  --   --  4.81*  LATICACIDVEN 1.7 1.5  --   --     Estimated Creatinine Clearance: 21.3 mL/min (A) (by C-G formula based on SCr of 4.81 mg/dL (H)).    Allergies[1]  Antimicrobials this admission: 1/6 cefepime  >> 1/6 1/6 vancomycin  >>   Thank you for allowing pharmacy to be a part of this patients care.  Illene Sweeting, PharmD, BCPS, BCIDP Work Cell: [phone] 09/30/2024 11:33 AM        [1] No Known Allergies

## 2024-09-30 NOTE — Progress Notes (Signed)
 Pt receives outpt HD at [facility] on MWF 11:45am . Navigator following to assist with any HD needs.  Suzen Satchel Dialysis Navigator [phone]

## 2024-09-30 NOTE — Inpatient Diabetes Management (Signed)
 Inpatient Diabetes Program Recommendations  AACE/ADA: New Consensus Statement on Inpatient Glycemic Control (2015)  Target Ranges:  Prepandial:   less than 140 mg/dL      Peak postprandial:   less than 180 mg/dL (1-2 hours)      Critically ill patients:  140 - 180 mg/dL     Latest Reference Range & Units 09/29/24 17:19 09/29/24 20:12 09/30/24 09:14  Glucose-Capillary 70 - 99 mg/dL 711 (H)  3 units Novolog   295 (H)   10 units Lantus  @2245  346 (H)  4 units Novolog    (H): Data is abnormally high     Admit: SOB/ SIRS  History: DM2, ESRD  Home DM Meds: Semglee  20 units BID      Humalog  0-15 units QID per SSI     Januvia 25 mg daily  Current Orders: Semglee  10 units at HS    Novolog  0-6 units TID ac/hs (both started last PM)   MD- Please consider increasing the Lantus  to 10 units BID (50% home dose)    --Will follow patient during hospitalization--  Adina Rudolpho Arrow RN, MSN, CDCES Diabetes Coordinator Inpatient Glycemic Control Team Team Pager: [phone] (8a-5p)

## 2024-09-30 NOTE — Plan of Care (Signed)

## 2024-09-30 NOTE — Progress Notes (Signed)
 " [facility]  ROUNDING NOTE   Subjective:   Eugene [name] is a 64 year old male with past medical conditions including diabetes, hepatitis C, bilateral BKA, anxiety and depression, and end-stage renal disease on hemodialysis.  Patient presents to the emergency department from his facility complaining of shortness of breath.  Patient is currently admitted for SIRS (systemic inflammatory response syndrome) (HCC) [R65.10]  Patient is known to our practice and receives outpatient dialysis treatments at [facility] on a MWF schedule, supervised by Dr. Dennise.  Last treatment received on Monday.  Patient seen and evaluated during dialysis.   HEMODIALYSIS FLOWSHEET:  Blood Flow Rate (mL/min): 350 mL/min Arterial Pressure (mmHg): -256.96 mmHg Venous Pressure (mmHg): 151.51 mmHg TMP (mmHg): 9.09 mmHg Ultrafiltration Rate (mL/min): 857 mL/min Dialysate Flow Rate (mL/min): 299 ml/min  Continually moaning, complains of nausea.  Dry heaving.  Decreased oxygen saturations, supplemental oxygen increased to 10 L.  Glucose 267.  Reports mild abdominal pain.  Respiratory panel negative for influenza, COVID-19, and RSV.  Blood cultures positive for staph and MRSA.  Urine culture pending.  Chest x-ray shows cardiomegaly with vascular congestion with mild interstitial edema.  Abdominal x-ray shows moderate diffuse air distention of the bowel suspicious for ileus.  Patient confirms he has not had a BM in 4 to 5 days.  According to floor nurse, patient refusing all laxatives.  We have been consulted to manage dialysis needs.  Objective:  Vital signs in last 24 hours:  Temp:  [98.2 F (36.8 C)-100.4 F (38 C)] 98.7 F (37.1 C) (01/07 1040) Pulse Rate:  [72-102] 72 (01/07 1102) Resp:  [15-21] 18 (01/07 1102) BP: (113-166)/(56-77) 163/73 (01/07 1102) SpO2:  [88 %-97 %] 93 % (01/07 1102) Weight:  [119 kg-123.6 kg] 123.6 kg (01/07 1043)  Weight change:  Filed Weights   09/29/24 1412  09/30/24 1043  Weight: 119 kg 123.6 kg    Intake/Output: No intake/output data recorded.   Intake/Output this shift:  No intake/output data recorded.  Physical Exam: General: ill-appearing  Head: Normocephalic, atraumatic. Moist oral mucosal membranes  Eyes: Anicteric  Lungs:  Coarse, [place] O2  Heart: Regular rate and rhythm  Abdomen:  Soft, nontender  Extremities: No peripheral edema.  Bilateral BKA  Neurologic: Awake, alert, conversant  Skin: Warm,dry, no rash  Access: Right IJ PermCath    Basic Metabolic Panel: Recent Labs  Lab 09/29/24 1437 09/30/24 0727  NA 139 136  K 4.3 5.1  CL 102 101  CO2 24 22  GLUCOSE 256* 398*  BUN 60* 70*  CREATININE 4.37* 4.81*  CALCIUM  8.3* 8.2*    Liver Function Tests: Recent Labs  Lab 09/29/24 1437  AST 21  ALT 28  ALKPHOS 83  BILITOT 0.4  PROT 6.5  ALBUMIN  3.2*   Recent Labs  Lab 09/30/24 0727  LIPASE <10*   No results for input(s): AMMONIA in the last 168 hours.  CBC: Recent Labs  Lab 09/29/24 1437 09/30/24 0425  WBC 26.4* 16.8*  NEUTROABS 23.2*  --   HGB 11.5* 9.9*  HCT 36.1* 32.7*  MCV 94.0 97.0  PLT 139* 118*    Cardiac Enzymes: No results for input(s): CKTOTAL, CKMB, CKMBINDEX, TROPONINI in the last 168 hours.  BNP: Invalid input(s): POCBNP  CBG: Recent Labs  Lab 09/29/24 1719 09/29/24 2012 09/30/24 0914 09/30/24 1132  GLUCAP 288* 295* 346* 267*    Microbiology: Results for orders placed or performed during the hospital encounter of 09/29/24  Resp panel by RT-PCR (RSV, Flu A&B, Covid)  Anterior Nasal Swab     Status: None   Collection Time: 09/29/24  2:16 PM   Specimen: Anterior Nasal Swab  Result Value Ref Range Status   SARS Coronavirus 2 by RT PCR NEGATIVE NEGATIVE Final    Comment: (NOTE) SARS-CoV-2 target nucleic acids are NOT DETECTED.  The SARS-CoV-2 RNA is generally detectable in upper respiratory specimens during the acute phase of infection. The  lowest concentration of SARS-CoV-2 viral copies this assay can detect is 138 copies/mL. A negative result does not preclude SARS-Cov-2 infection and should not be used as the sole basis for treatment or other patient management decisions. A negative result may occur with  improper specimen collection/handling, submission of specimen other than nasopharyngeal swab, presence of viral mutation(s) within the areas targeted by this assay, and inadequate number of viral copies(<138 copies/mL). A negative result must be combined with clinical observations, patient history, and epidemiological information. The expected result is Negative.  Fact Sheet for Patients:  bloggercourse.com  Fact Sheet for Healthcare Providers:  seriousbroker.it  This test is no t yet approved or cleared by the [place]  FDA and  has been authorized for detection and/or diagnosis of SARS-CoV-2 by FDA under an Emergency Use Authorization (EUA). This EUA will remain  in effect (meaning this test can be used) for the duration of the COVID-19 declaration under Section 564(b)(1) of the Act, 21 U.S.C.section 360bbb-3(b)(1), unless the authorization is terminated  or revoked sooner.       Influenza A by PCR NEGATIVE NEGATIVE Final   Influenza B by PCR NEGATIVE NEGATIVE Final    Comment: (NOTE) The Xpert Xpress SARS-CoV-2/FLU/RSV plus assay is intended as an aid in the diagnosis of influenza from Nasopharyngeal swab specimens and should not be used as a sole basis for treatment. Nasal washings and aspirates are unacceptable for Xpert Xpress SARS-CoV-2/FLU/RSV testing.  Fact Sheet for Patients: bloggercourse.com  Fact Sheet for Healthcare Providers: seriousbroker.it  This test is not yet approved or cleared by the [place]  FDA and has been authorized for detection and/or diagnosis of SARS-CoV-2 by FDA under  an Emergency Use Authorization (EUA). This EUA will remain in effect (meaning this test can be used) for the duration of the COVID-19 declaration under Section 564(b)(1) of the Act, 21 U.S.C. section 360bbb-3(b)(1), unless the authorization is terminated or revoked.     Resp Syncytial Virus by PCR NEGATIVE NEGATIVE Final    Comment: (NOTE) Fact Sheet for Patients: bloggercourse.com  Fact Sheet for Healthcare Providers: seriousbroker.it  This test is not yet approved or cleared by the [place]  FDA and has been authorized for detection and/or diagnosis of SARS-CoV-2 by FDA under an Emergency Use Authorization (EUA). This EUA will remain in effect (meaning this test can be used) for the duration of the COVID-19 declaration under Section 564(b)(1) of the Act, 21 U.S.C. section 360bbb-3(b)(1), unless the authorization is terminated or revoked.  Performed at [facility], [address] [place], [place] [address]   Blood Culture (routine x 2)     Status: None (Preliminary result)   Collection Time: 09/29/24  2:37 PM   Specimen: BLOOD  Result Value Ref Range Status   Specimen Description BLOOD BLOOD RIGHT FOREARM  Final   Special Requests   Final    BOTTLES DRAWN AEROBIC AND ANAEROBIC Blood Culture results may not be optimal due to an inadequate volume of blood received in culture bottles   Culture  Setup Time   Final    GRAM POSITIVE  COCCI IN BOTH AEROBIC AND ANAEROBIC BOTTLES    Culture   Final    NO GROWTH < 24 HOURS Performed at [facility], [address] [place], [place] [address]    Report Status PENDING  Incomplete  Blood Culture (routine x 2)     Status: None (Preliminary result)   Collection Time: 09/29/24  2:37 PM   Specimen: BLOOD  Result Value Ref Range Status   Specimen Description BLOOD LEFT ANTECUBITAL  Final   Special Requests   Final    BOTTLES DRAWN AEROBIC AND ANAEROBIC Blood  Culture results may not be optimal due to an inadequate volume of blood received in culture bottles   Culture  Setup Time   Final    Organism ID to follow GRAM POSITIVE COCCI IN BOTH AEROBIC AND ANAEROBIC BOTTLES    Culture   Final    NO GROWTH < 24 HOURS Performed at [facility], [address] [place], [place] [address]    Report Status PENDING  Incomplete  Blood Culture ID Panel (Reflexed)     Status: Abnormal   Collection Time: 09/29/24  2:37 PM  Result Value Ref Range Status   Enterococcus faecalis NOT DETECTED NOT DETECTED Final   Enterococcus Faecium NOT DETECTED NOT DETECTED Final   Listeria monocytogenes NOT DETECTED NOT DETECTED Final   Staphylococcus species DETECTED (A) NOT DETECTED Final    Comment: CRITICAL RESULT CALLED TO, READ BACK BY AND VERIFIED WITHBETHA RANKIN DILLS PHARMD [id number] 09/30/24 HNM    Staphylococcus aureus (BCID) DETECTED (A) NOT DETECTED Final    Comment: Methicillin (oxacillin)-resistant Staphylococcus aureus (MRSA). MRSA is predictably resistant to beta-lactam antibiotics (except ceftaroline). Preferred therapy is vancomycin  unless clinically contraindicated. Patient requires contact precautions if  hospitalized. CRITICAL RESULT CALLED TO, READ BACK BY AND VERIFIED WITH: RANKIN DILLS PHARMD [id number] 09/30/24 HNM    Staphylococcus epidermidis NOT DETECTED NOT DETECTED Final   Staphylococcus lugdunensis NOT DETECTED NOT DETECTED Final   Streptococcus species NOT DETECTED NOT DETECTED Final   Streptococcus agalactiae NOT DETECTED NOT DETECTED Final   Streptococcus pneumoniae NOT DETECTED NOT DETECTED Final   Streptococcus pyogenes NOT DETECTED NOT DETECTED Final   A.calcoaceticus-baumannii NOT DETECTED NOT DETECTED Final   Bacteroides fragilis NOT DETECTED NOT DETECTED Final   Enterobacterales NOT DETECTED NOT DETECTED Final   Enterobacter cloacae complex NOT DETECTED NOT DETECTED Final   Escherichia coli NOT DETECTED NOT DETECTED Final   Klebsiella  aerogenes NOT DETECTED NOT DETECTED Final   Klebsiella oxytoca NOT DETECTED NOT DETECTED Final   Klebsiella pneumoniae NOT DETECTED NOT DETECTED Final   Proteus species NOT DETECTED NOT DETECTED Final   Salmonella species NOT DETECTED NOT DETECTED Final   Serratia marcescens NOT DETECTED NOT DETECTED Final   Haemophilus influenzae NOT DETECTED NOT DETECTED Final   Neisseria meningitidis NOT DETECTED NOT DETECTED Final   Pseudomonas aeruginosa NOT DETECTED NOT DETECTED Final   Stenotrophomonas maltophilia NOT DETECTED NOT DETECTED Final   Candida albicans NOT DETECTED NOT DETECTED Final   Candida auris NOT DETECTED NOT DETECTED Final   Candida glabrata NOT DETECTED NOT DETECTED Final   Candida krusei NOT DETECTED NOT DETECTED Final   Candida parapsilosis NOT DETECTED NOT DETECTED Final   Candida tropicalis NOT DETECTED NOT DETECTED Final   Cryptococcus neoformans/gattii NOT DETECTED NOT DETECTED Final   Meth resistant mecA/C and MREJ DETECTED (A) NOT DETECTED Final    Comment: CRITICAL RESULT CALLED TO, READ BACK BY AND VERIFIED WITH: NATHAN BELUE PHARMD  [id number] 09/30/24 HNM Performed at [facility], [address] [place], [place] [address]     Coagulation Studies: Recent Labs    09/29/24 1437  LABPROT 14.4  INR 1.1    Urinalysis: Recent Labs    09/29/24 1417  COLORURINE AMBER*  LABSPEC 1.024  PHURINE 8.0  GLUCOSEU >=500*  HGBUR NEGATIVE  BILIRUBINUR NEGATIVE  KETONESUR NEGATIVE  PROTEINUR >=300*  NITRITE NEGATIVE  LEUKOCYTESUR MODERATE*      Imaging: CT ABDOMEN PELVIS WO CONTRAST Result Date: 09/30/2024 EXAM: CT ABDOMEN AND PELVIS WITHOUT CONTRAST 09/30/2024 02:32:43 AM TECHNIQUE: CT of the abdomen and pelvis was performed without the administration of intravenous contrast. Multiplanar reformatted images are provided for review. Automated exposure control, iterative reconstruction, and/or weight-based adjustment of the mA/kV was utilized to reduce the  radiation dose to as low as reasonably achievable. COMPARISON: 05/11/2024 CLINICAL HISTORY: Abdominal pain, acute, nonlocalized. FINDINGS: LOWER CHEST: Small bilateral pleural effusions. Bilateral rounded lower lobe airspace opacities could reflect rounded atelectasis or pneumonia. LIVER: The liver is unremarkable. GALLBLADDER AND BILE DUCTS: Gallbladder is unremarkable. No biliary ductal dilatation. SPLEEN: No acute abnormality. PANCREAS: No acute abnormality. ADRENAL GLANDS: No acute abnormality. KIDNEYS, URETERS AND BLADDER: No stones in the kidneys or ureters. No hydronephrosis. No perinephric or periureteral stranding. Suprapubic Foley catheter in place with urinary bladder decompressed. GI AND BOWEL: Stomach demonstrates no acute abnormality. Rectosigmoid wall thickening concerning for proctocolitis. No bowel obstruction. Normal appendix. PERITONEUM AND RETROPERITONEUM: No ascites. No free air. VASCULATURE: Aorta is normal in caliber. Aortic atherosclerosis. LYMPH NODES: No lymphadenopathy. REPRODUCTIVE ORGANS: No acute abnormality. BONES AND SOFT TISSUES: Postoperative changes in the thoracic spine. Stable mild chronic compression fracture at T12 and L1. Umbilical hernia containing fat, stable. No focal soft tissue abnormality. IMPRESSION: 1. Rectosigmoid wall thickening concerning for proctocolitis. No bowel obstruction. Normal appendix. 2. Small bilateral pleural effusions. 3. Bilateral rounded lower lobe airspace opacities, possibly representing rounded atelectasis or pneumonia. Electronically signed by: Franky Crease MD 09/30/2024 02:45 AM EST RP Workstation: [id number]   DG Abd 2 Views Result Date: 09/29/2024 CLINICAL DATA:  Abdominal pain EXAM: DG ABDOMEN 2V COMPARISON:  05/12/2024 FINDINGS: Extensive hardware in the thoracic spine. Left greater than right pleural effusions and basilar airspace disease. Multiple moderate air-filled dilated loops of small bowel measuring up to 4.2 cm. IMPRESSION: 1.  Moderate diffuse air distension of the bowel, potentially due to ileus with partial or developing bowel obstruction not excluded. Consider radiographic follow-up. 2. Left greater than right pleural effusions and basilar airspace disease. Electronically Signed   By: Luke Bun M.D.   On: 09/29/2024 17:00   DG Chest Port 1 View Result Date: 09/29/2024 CLINICAL DATA:  Desaturation EXAM: PORTABLE CHEST 1 VIEW COMPARISON:  08/03/2024 FINDINGS: Right-sided central venous catheter tip at the cavoatrial region. Posterior spinal fusion hardware and interbody cage device. Cardiomegaly with vascular congestion and suspected mild interstitial edema. Left greater than right pleural effusions. IMPRESSION: Cardiomegaly with vascular congestion and suspected mild interstitial edema. Left greater than right pleural effusions. Electronically Signed   By: Luke Bun M.D.   On: 09/29/2024 15:21     Medications:     bisacodyl   10 mg Oral QHS   carvedilol   6.25 mg Oral BID WC   Chlorhexidine  Gluconate Cloth  6 each Topical Q0600   cyanocobalamin   1,000 mcg Oral Daily   diltiazem   240 mg Oral QPM   doxepin   10 mg Oral QHS   famotidine   10 mg Oral Daily  folic acid   1 mg Oral Daily   gabapentin   300 mg Oral QHS   heparin   5,000 Units Subcutaneous Q8H   insulin  aspart  0-5 Units Subcutaneous QHS   insulin  aspart  0-6 Units Subcutaneous TID WC   insulin  glargine  10 Units Subcutaneous QHS   lactulose   20 g Oral BID   levothyroxine   175 mcg Oral QHS   linaclotide   145 mcg Oral QAC breakfast   loratadine   10 mg Oral Daily   midodrine   5 mg Oral TID WC   polyethylene glycol  17 g Oral BID   senna-docusate  1 tablet Oral BID   simethicone   80 mg Oral QID   vancomycin  variable dose per unstable renal function (pharmacist dosing)   Does not apply See admin instructions   acetaminophen  **OR** acetaminophen , alteplase , alteplase , alteplase , alteplase , bisacodyl , guaiFENesin , heparin , heparin , heparin , heparin ,  ipratropium-albuterol , liver oil-zinc  oxide, ondansetron  **OR** ondansetron  (ZOFRAN ) IV, oxyCODONE   Assessment/ Plan:  Eugene [name] is a 64 y.o.  male with past medical conditions including diabetes, hepatitis C, bilateral BKA, anxiety and depression, and end-stage renal disease on hemodialysis.  Patient presents to the emergency department from his facility complaining of shortness of breath.  Patient is currently admitted for SIRS (systemic inflammatory response syndrome) (HCC) [R65.10]  [facility]/MWF/right IJ PermCath/120.5 kg  Anemia of chronic kidney disease Lab Results  Component Value Date   HGB 9.9 (L) 09/30/2024   Hemoglobin acceptable for renal patient.  No need for ESA's at this time.  2.  Systemic inflammatory response syndrome, suspected due to fever, leukocytosis, and tachypnea on ED arrival.  Suspected source of suprapubic catheter.Abdominal x-ray shows moderate diffuse air distention of the bowel suspicious for ileus.  Cefepime  and vancomycin  ordered per primary team.  3.  End-stage renal disease on hemodialysis.  Last treatment received on Monday.  Scheduled for dialysis today.  Patient received roughly 30 minutes of treatment prior to ending treatment due to discomfort and respiratory distress.  Will assess need for additional treatment tomorrow.  4. Secondary Hyperparathyroidism: with outpatient labs: None available  Lab Results  Component Value Date   PTH 177 (H) 04/23/2024   CALCIUM  8.2 (L) 09/30/2024   PHOS 4.1 08/05/2024    Will continue to monitor bone minerals during this admission.   LOS: 1 Calvina Liptak 1/7/202611:50 AM   "

## 2024-10-01 ENCOUNTER — Encounter
Admission: EM | Disposition: A | Discharge status: Skilled Nursing Facility | Payer: Self-pay | Source: Skilled Nursing Facility | Attending: Student

## 2024-10-01 ENCOUNTER — Encounter: Payer: Self-pay | Admitting: Vascular Surgery

## 2024-10-01 DIAGNOSIS — I871 Compression of vein: Secondary | ICD-10-CM | POA: Diagnosis not present

## 2024-10-01 DIAGNOSIS — B9562 Methicillin resistant Staphylococcus aureus infection as the cause of diseases classified elsewhere: Secondary | ICD-10-CM | POA: Diagnosis not present

## 2024-10-01 DIAGNOSIS — R7881 Bacteremia: Secondary | ICD-10-CM

## 2024-10-01 DIAGNOSIS — T827XXA Infection and inflammatory reaction due to other cardiac and vascular devices, implants and grafts, initial encounter: Secondary | ICD-10-CM

## 2024-10-01 DIAGNOSIS — E1122 Type 2 diabetes mellitus with diabetic chronic kidney disease: Secondary | ICD-10-CM | POA: Diagnosis not present

## 2024-10-01 DIAGNOSIS — E039 Hypothyroidism, unspecified: Secondary | ICD-10-CM | POA: Diagnosis not present

## 2024-10-01 DIAGNOSIS — K5900 Constipation, unspecified: Secondary | ICD-10-CM | POA: Diagnosis not present

## 2024-10-01 DIAGNOSIS — Z992 Dependence on renal dialysis: Secondary | ICD-10-CM | POA: Diagnosis not present

## 2024-10-01 DIAGNOSIS — Z794 Long term (current) use of insulin: Secondary | ICD-10-CM | POA: Diagnosis not present

## 2024-10-01 DIAGNOSIS — B1920 Unspecified viral hepatitis C without hepatic coma: Secondary | ICD-10-CM

## 2024-10-01 DIAGNOSIS — N186 End stage renal disease: Secondary | ICD-10-CM | POA: Diagnosis not present

## 2024-10-01 DIAGNOSIS — N319 Neuromuscular dysfunction of bladder, unspecified: Secondary | ICD-10-CM | POA: Diagnosis not present

## 2024-10-01 HISTORY — PX: DIALYSIS/PERMA CATHETER REMOVAL: CATH118289

## 2024-10-01 LAB — CBC WITH DIFFERENTIAL/PLATELET
Abs Immature Granulocytes: 0.16 K/uL — ABNORMAL HIGH (ref 0.00–0.07)
Basophils Absolute: 0 K/uL (ref 0.0–0.1)
Basophils Relative: 0 %
Eosinophils Absolute: 0 K/uL (ref 0.0–0.5)
Eosinophils Relative: 0 %
HCT: 32.3 % — ABNORMAL LOW (ref 39.0–52.0)
Hemoglobin: 10.4 g/dL — ABNORMAL LOW (ref 13.0–17.0)
Immature Granulocytes: 2 %
Lymphocytes Relative: 7 %
Lymphs Abs: 0.7 K/uL (ref 0.7–4.0)
MCH: 30 pg (ref 26.0–34.0)
MCHC: 32.2 g/dL (ref 30.0–36.0)
MCV: 93.1 fL (ref 80.0–100.0)
Monocytes Absolute: 0.7 K/uL (ref 0.1–1.0)
Monocytes Relative: 6 %
Neutro Abs: 9.3 K/uL — ABNORMAL HIGH (ref 1.7–7.7)
Neutrophils Relative %: 85 %
Platelets: 93 K/uL — ABNORMAL LOW (ref 150–400)
RBC: 3.47 MIL/uL — ABNORMAL LOW (ref 4.22–5.81)
RDW: 14.4 % (ref 11.5–15.5)
Smear Review: NORMAL
WBC: 10.8 K/uL — ABNORMAL HIGH (ref 4.0–10.5)
nRBC: 0 % (ref 0.0–0.2)

## 2024-10-01 LAB — BASIC METABOLIC PANEL WITH GFR
Anion gap: 14 (ref 5–15)
BUN: 65 mg/dL — ABNORMAL HIGH (ref 8–23)
CO2: 23 mmol/L (ref 22–32)
Calcium: 8.1 mg/dL — ABNORMAL LOW (ref 8.9–10.3)
Chloride: 102 mmol/L (ref 98–111)
Creatinine, Ser: 4.47 mg/dL — ABNORMAL HIGH (ref 0.61–1.24)
GFR, Estimated: 14 mL/min — ABNORMAL LOW
Glucose, Bld: 261 mg/dL — ABNORMAL HIGH (ref 70–99)
Potassium: 4.3 mmol/L (ref 3.5–5.1)
Sodium: 138 mmol/L (ref 135–145)

## 2024-10-01 LAB — GLUCOSE, CAPILLARY
Glucose-Capillary: 213 mg/dL — ABNORMAL HIGH (ref 70–99)
Glucose-Capillary: 221 mg/dL — ABNORMAL HIGH (ref 70–99)
Glucose-Capillary: 238 mg/dL — ABNORMAL HIGH (ref 70–99)
Glucose-Capillary: 236 mg/dL — ABNORMAL HIGH (ref 70–99)

## 2024-10-01 MED ORDER — HEPARIN SODIUM (PORCINE) 1000 UNIT/ML DIALYSIS: 1000.0000 [IU] | INTRAMUSCULAR | Status: DC | PRN

## 2024-10-01 MED ORDER — FLEET ENEMA RE ENEM
1.0000 | ENEMA | Freq: Every day | RECTAL | Status: DC | PRN
  Administered 2024-10-01: 1 via RECTAL

## 2024-10-01 MED ORDER — INSULIN ASPART 100 UNIT/ML IJ SOLN: 3.0000 [IU] | Freq: Three times a day (TID) | INTRAMUSCULAR | Status: DC

## 2024-10-01 MED ORDER — INSULIN GLARGINE 100 UNIT/ML ~~LOC~~ SOLN
13.0000 [IU] | Freq: Every day | SUBCUTANEOUS | Status: DC
  Administered 2024-10-01: 13 [IU] via SUBCUTANEOUS
  Filled 2024-10-01: qty 0.13

## 2024-10-01 MED ORDER — LIDOCAINE-EPINEPHRINE (PF) 1 %-1:200000 IJ SOLN
INTRAMUSCULAR | Status: DC | PRN
  Administered 2024-10-01: 20 mL via INTRADERMAL

## 2024-10-01 MED ORDER — ALTEPLASE 2 MG IJ SOLR: 2.0000 mg | Freq: Once | INTRAMUSCULAR | Status: DC | PRN

## 2024-10-01 NOTE — Interval H&P Note (Signed)
 History and Physical Interval Note:  10/01/2024 10:32 AM  Eugene [name]  has presented today for surgery, with the diagnosis of bacteremia.  The various methods of treatment have been discussed with the patient and family. After consideration of risks, benefits and other options for treatment, the patient has consented to  Procedures: DIALYSIS/PERMA CATHETER REMOVAL (N/A) as a surgical intervention.  The patient's history has been reviewed, patient examined, no change in status, stable for surgery.  I have reviewed the patient's chart and labs.  Questions were answered to the patient's satisfaction.     Vernal Hritz

## 2024-10-01 NOTE — Progress Notes (Signed)
 Permcath removed at bedside by Dr. Marea. Pt. Tolerated well. Sterile drsg. To Right upper chest.

## 2024-10-01 NOTE — Progress Notes (Signed)
 " Progress Note   Patient: Eugene [name] FMW:[id number] DOB: 14-Mar-1961 DOA: 09/29/2024     2 DOS: the patient was seen and examined on 10/01/2024   Brief hospital course: HPI: Eugene [name] is a 64 y.o. male with medical history significant of anxiety, depression, type 2 diabetes mellitus, history of hepatitis C, end-stage renal disease on dialysis, bilateral BKA.  Patient known to most historian.  States he is short of breath.  He is sick to his stomach.  He vomited.  No diarrhea.  He is short of breath.  He is having a fever.  In the ER he was found to have an elevated white blood cell count and started on sepsis protocol.  Respiratory panel pending, chest x-ray shows mild interstitial edema and left greater than right pleural effusion.  Urine looks positive and patient has a suprapubic catheter.  Hospitalist services contacted for further evaluation.   Assessment and Plan: *Sepsis secondary to MRSA bacteremia Present on admission.  Sepsis criteria fever leukocytosis and tachycardia.  Now resolved.  MRSA positive in 4 out of 4 bottles. HD catheter removed 1/8 Continue vancomycin   Normocytic anemia Thrombocytopenia Likely in settong of anemia of chronic disease and infection.  CTM for now.   Abdominal discomfort Continues to feel nauseous and have episodes of emesis.  No diarrhea.  CT of the abdomen and pelvis with contrast notable for proctocolitis, but no evidence of etiology of symptoms.  --Continue symptomatic support  -- Antiemetics remain ineffective, will consider NG tube.  ESRD (end stage renal disease) (HCC) Hemodialysis per nephrology  Paroxysmal SVT (supraventricular tachycardia) On Cardizem  and Coreg   Heart failure with preserved ejection fraction (HCC) Dialysis to manage fluid  Chronic respiratory failure with hypoxia (HCC) On 6 L wean as able.   HTN Hypertensive, likely acutely worsened due to patient being uncomfortable.  -Hold midodrine .   Chronic bilateral  pleural effusions Left greater than right.  Hd.   Chronic pain On oxycodone   Uncontrolled type 2 diabetes mellitus with hyperglycemia, with long-term current use of insulin  (HCC) A1c 7.3 though ESRD. Continue Lantus  and sliding scale  Chronic constipation Patient on numerous medications for constipation.   Obesity (BMI 30-39.9) BMI 37.64  Hypothyroidism On levothyroxine        Subjective: Patient continues to have nausea but no emesis.  Physical Exam: Vitals:   10/01/24 0404 10/01/24 1000 10/01/24 1054 10/01/24 1531  BP: 137/70 (!) 164/77 (!) 157/79 (!) 150/78  Pulse: 76 83 85 80  Resp: 20   18  Temp: 100.2 F (37.9 C) 99 F (37.2 C) 99 F (37.2 C) 99.3 F (37.4 C)  TempSrc: Oral Temporal Temporal Oral  SpO2: 90% 92% 96% 94%  Weight:      Height:        Constitutional: In no distress.  Cardiovascular: Normal rate, regular rhythm. No lower extremity edema  Pulmonary: Non labored breathing onNC, no wheezing or rales.   Abdominal: Soft. Non distended and non tender Musculoskeletal: s/p Bilateral BKA  Neurological: Alert and oriented to person, place, and time. Non focal  Skin: Skin is warm and dry.   Data Reviewed:     Latest Ref Rng & Units 10/01/2024    1:30 PM 09/30/2024    7:27 AM 09/29/2024    2:37 PM  BMP  Glucose 70 - 99 mg/dL 738  601  743   BUN 8 - 23 mg/dL 65  70  60   Creatinine 0.61 - 1.24 mg/dL 5.52  5.18  5.62  Sodium 135 - 145 mmol/L 138  136  139   Potassium 3.5 - 5.1 mmol/L 4.3  5.1  4.3   Chloride 98 - 111 mmol/L 102  101  102   CO2 22 - 32 mmol/L 23  22  24    Calcium  8.9 - 10.3 mg/dL 8.1  8.2  8.3       Latest Ref Rng & Units 10/01/2024    1:30 PM 09/30/2024    4:25 AM 09/29/2024    2:37 PM  CBC  WBC 4.0 - 10.5 K/uL 10.8  16.8  26.4   Hemoglobin 13.0 - 17.0 g/dL 89.5  9.9  88.4   Hematocrit 39.0 - 52.0 % 32.3  32.7  36.1   Platelets 150 - 400 K/uL 93  118  139      Family Communication: None at bedside.   Disposition: Status is:  Inpatient Remains inpatient appropriate because: MRSA bacteremia, IV antibiotics  Planned Discharge Destination: Pending clinical course     Time spent: 35 minutes  Author: Alban Pepper, MD 10/01/2024 4:56 PM  For on call review [email or web address].  "

## 2024-10-01 NOTE — Progress Notes (Signed)
 " [facility]  ROUNDING NOTE   Subjective:   Eugene [name] is a 64 year old male with past medical conditions including diabetes, hepatitis C, bilateral BKA, anxiety and depression, and end-stage renal disease on hemodialysis.  Patient presents to the emergency department from his facility complaining of shortness of breath.  Patient is currently admitted for SIRS (systemic inflammatory response syndrome) (HCC) [R65.10]  Patient is known to our practice and receives outpatient dialysis treatments at [facility] on a MWF schedule, supervised by Dr. Dennise.    Patient seen laying in bed Alert Denies pain Adamant that he does not want dialysis today Complains of abd pain  Objective:  Vital signs in last 24 hours:  Temp:  [97.5 F (36.4 C)-100.2 F (37.9 C)] 99 F (37.2 C) (01/08 1054) Pulse Rate:  [76-85] 85 (01/08 1054) Resp:  [16-20] 20 (01/08 0404) BP: (137-174)/(64-79) 157/79 (01/08 1054) SpO2:  [90 %-96 %] 96 % (01/08 1054)  Weight change: 4.6 kg Filed Weights   09/29/24 1412 09/30/24 1043  Weight: 119 kg 123.6 kg    Intake/Output: No intake/output data recorded.   Intake/Output this shift:  No intake/output data recorded.  Physical Exam: General: ill-appearing  Head: Normocephalic, atraumatic. Moist oral mucosal membranes  Eyes: Anicteric  Lungs:  Coarse, [place] O2  Heart: Regular rate and rhythm  Abdomen:  Soft, nontender  Extremities: No peripheral edema.  Bilateral BKA  Neurologic: Awake, alert, conversant  Skin: Warm,dry, no rash  Access: Right IJ PermCath    Basic Metabolic Panel: Recent Labs  Lab 09/29/24 1437 09/30/24 0727  NA 139 136  K 4.3 5.1  CL 102 101  CO2 24 22  GLUCOSE 256* 398*  BUN 60* 70*  CREATININE 4.37* 4.81*  CALCIUM  8.3* 8.2*    Liver Function Tests: Recent Labs  Lab 09/29/24 1437  AST 21  ALT 28  ALKPHOS 83  BILITOT 0.4  PROT 6.5  ALBUMIN  3.2*   Recent Labs  Lab 09/30/24 0727  LIPASE <10*   No  results for input(s): AMMONIA in the last 168 hours.  CBC: Recent Labs  Lab 09/29/24 1437 09/30/24 0425  WBC 26.4* 16.8*  NEUTROABS 23.2*  --   HGB 11.5* 9.9*  HCT 36.1* 32.7*  MCV 94.0 97.0  PLT 139* 118*    Cardiac Enzymes: No results for input(s): CKTOTAL, CKMB, CKMBINDEX, TROPONINI in the last 168 hours.  BNP: Invalid input(s): POCBNP  CBG: Recent Labs  Lab 09/30/24 1132 09/30/24 1630 09/30/24 2201 10/01/24 0819 10/01/24 1142  GLUCAP 267* 267* 271* 213* 221*    Microbiology: Results for orders placed or performed during the hospital encounter of 09/29/24  Resp panel by RT-PCR (RSV, Flu A&B, Covid) Anterior Nasal Swab     Status: None   Collection Time: 09/29/24  2:16 PM   Specimen: Anterior Nasal Swab  Result Value Ref Range Status   SARS Coronavirus 2 by RT PCR NEGATIVE NEGATIVE Final    Comment: (NOTE) SARS-CoV-2 target nucleic acids are NOT DETECTED.  The SARS-CoV-2 RNA is generally detectable in upper respiratory specimens during the acute phase of infection. The lowest concentration of SARS-CoV-2 viral copies this assay can detect is 138 copies/mL. A negative result does not preclude SARS-Cov-2 infection and should not be used as the sole basis for treatment or other patient management decisions. A negative result may occur with  improper specimen collection/handling, submission of specimen other than nasopharyngeal swab, presence of viral mutation(s) within the areas targeted by this assay, and inadequate  number of viral copies(<138 copies/mL). A negative result must be combined with clinical observations, patient history, and epidemiological information. The expected result is Negative.  Fact Sheet for Patients:  bloggercourse.com  Fact Sheet for Healthcare Providers:  seriousbroker.it  This test is no t yet approved or cleared by the [place]  FDA and  has been authorized for  detection and/or diagnosis of SARS-CoV-2 by FDA under an Emergency Use Authorization (EUA). This EUA will remain  in effect (meaning this test can be used) for the duration of the COVID-19 declaration under Section 564(b)(1) of the Act, 21 U.S.C.section 360bbb-3(b)(1), unless the authorization is terminated  or revoked sooner.       Influenza A by PCR NEGATIVE NEGATIVE Final   Influenza B by PCR NEGATIVE NEGATIVE Final    Comment: (NOTE) The Xpert Xpress SARS-CoV-2/FLU/RSV plus assay is intended as an aid in the diagnosis of influenza from Nasopharyngeal swab specimens and should not be used as a sole basis for treatment. Nasal washings and aspirates are unacceptable for Xpert Xpress SARS-CoV-2/FLU/RSV testing.  Fact Sheet for Patients: bloggercourse.com  Fact Sheet for Healthcare Providers: seriousbroker.it  This test is not yet approved or cleared by the [place]  FDA and has been authorized for detection and/or diagnosis of SARS-CoV-2 by FDA under an Emergency Use Authorization (EUA). This EUA will remain in effect (meaning this test can be used) for the duration of the COVID-19 declaration under Section 564(b)(1) of the Act, 21 U.S.C. section 360bbb-3(b)(1), unless the authorization is terminated or revoked.     Resp Syncytial Virus by PCR NEGATIVE NEGATIVE Final    Comment: (NOTE) Fact Sheet for Patients: bloggercourse.com  Fact Sheet for Healthcare Providers: seriousbroker.it  This test is not yet approved or cleared by the [place]  FDA and has been authorized for detection and/or diagnosis of SARS-CoV-2 by FDA under an Emergency Use Authorization (EUA). This EUA will remain in effect (meaning this test can be used) for the duration of the COVID-19 declaration under Section 564(b)(1) of the Act, 21 U.S.C. section 360bbb-3(b)(1), unless the authorization is  terminated or revoked.  Performed at [facility], [address] [place], [place] [address]   Urine Culture     Status: Abnormal (Preliminary result)   Collection Time: 09/29/24  2:17 PM   Specimen: Urine, Random  Result Value Ref Range Status   Specimen Description   Final    URINE, RANDOM Performed at [facility], [address], [place], [place] [address]    Special Requests   Final    NONE Reflexed from [id number] Performed at [facility], [address] [place], [place] [address]    Culture (A)  Final    >=100,000 COLONIES/mL PROTEUS MIRABILIS >=100,000 COLONIES/mL STAPHYLOCOCCUS AUREUS    Report Status PENDING  Incomplete  Blood Culture (routine x 2)     Status: Abnormal (Preliminary result)   Collection Time: 09/29/24  2:37 PM   Specimen: BLOOD  Result Value Ref Range Status   Specimen Description   Final    BLOOD BLOOD RIGHT FOREARM Performed at [facility], [address], [place], [place] [address]    Special Requests   Final    BOTTLES DRAWN AEROBIC AND ANAEROBIC Blood Culture results may not be optimal due to an inadequate volume of blood received in culture bottles Performed at [facility], [address], [place], [place] [address]    Culture  Setup Time   Final    GRAM POSITIVE  COCCI IN BOTH AEROBIC AND ANAEROBIC BOTTLES CRITICAL VALUE NOTED.  VALUE IS CONSISTENT WITH PREVIOUSLY REPORTED AND CALLED VALUE. Performed at [facility], [address] [place], [place] [address]    Culture STAPHYLOCOCCUS AUREUS (A)  Final   Report Status PENDING  Incomplete  Blood Culture (routine x 2)     Status: Abnormal (Preliminary result)   Collection Time: 09/29/24  2:37 PM   Specimen: BLOOD  Result Value Ref Range Status   Specimen Description   Final    BLOOD LEFT ANTECUBITAL Performed at [facility], [address] [place], [place] [address]    Special Requests   Final    BOTTLES DRAWN AEROBIC  AND ANAEROBIC Blood Culture results may not be optimal due to an inadequate volume of blood received in culture bottles Performed at [facility], [address], [place], [place] [address]    Culture  Setup Time   Final    GRAM POSITIVE COCCI IN BOTH AEROBIC AND ANAEROBIC BOTTLES CRITICAL RESULT CALLED TO, READ BACK BY AND VERIFIED WITH: PHARMD NATHAN B ON [id number] @0457  BY HNM    Culture (A)  Final    STAPHYLOCOCCUS AUREUS SUSCEPTIBILITIES TO FOLLOW Performed at [facility], [address] [place], [place] [address]    Report Status PENDING  Incomplete  Blood Culture ID Panel (Reflexed)     Status: Abnormal   Collection Time: 09/29/24  2:37 PM  Result Value Ref Range Status   Enterococcus faecalis NOT DETECTED NOT DETECTED Final   Enterococcus Faecium NOT DETECTED NOT DETECTED Final   Listeria monocytogenes NOT DETECTED NOT DETECTED Final   Staphylococcus species DETECTED (A) NOT DETECTED Final    Comment: CRITICAL RESULT CALLED TO, READ BACK BY AND VERIFIED WITHBETHA RANKIN DILLS PHARMD [id number] 09/30/24 HNM    Staphylococcus aureus (BCID) DETECTED (A) NOT DETECTED Final    Comment: Methicillin (oxacillin)-resistant Staphylococcus aureus (MRSA). MRSA is predictably resistant to beta-lactam antibiotics (except ceftaroline). Preferred therapy is vancomycin  unless clinically contraindicated. Patient requires contact precautions if  hospitalized. CRITICAL RESULT CALLED TO, READ BACK BY AND VERIFIED WITH: RANKIN DILLS PHARMD 9542 09/30/24 HNM    Staphylococcus epidermidis NOT DETECTED NOT DETECTED Final   Staphylococcus lugdunensis NOT DETECTED NOT DETECTED Final   Streptococcus species NOT DETECTED NOT DETECTED Final   Streptococcus agalactiae NOT DETECTED NOT DETECTED Final   Streptococcus pneumoniae NOT DETECTED NOT DETECTED Final   Streptococcus pyogenes NOT DETECTED NOT DETECTED Final   A.calcoaceticus-baumannii NOT DETECTED NOT DETECTED Final   Bacteroides fragilis NOT  DETECTED NOT DETECTED Final   Enterobacterales NOT DETECTED NOT DETECTED Final   Enterobacter cloacae complex NOT DETECTED NOT DETECTED Final   Escherichia coli NOT DETECTED NOT DETECTED Final   Klebsiella aerogenes NOT DETECTED NOT DETECTED Final   Klebsiella oxytoca NOT DETECTED NOT DETECTED Final   Klebsiella pneumoniae NOT DETECTED NOT DETECTED Final   Proteus species NOT DETECTED NOT DETECTED Final   Salmonella species NOT DETECTED NOT DETECTED Final   Serratia marcescens NOT DETECTED NOT DETECTED Final   Haemophilus influenzae NOT DETECTED NOT DETECTED Final   Neisseria meningitidis NOT DETECTED NOT DETECTED Final   Pseudomonas aeruginosa NOT DETECTED NOT DETECTED Final   Stenotrophomonas maltophilia NOT DETECTED NOT DETECTED Final   Candida albicans NOT DETECTED NOT DETECTED Final   Candida auris NOT DETECTED NOT DETECTED Final   Candida glabrata NOT DETECTED NOT DETECTED Final   Candida krusei NOT DETECTED NOT DETECTED Final   Candida parapsilosis  NOT DETECTED NOT DETECTED Final   Candida tropicalis NOT DETECTED NOT DETECTED Final   Cryptococcus neoformans/gattii NOT DETECTED NOT DETECTED Final   Meth resistant mecA/C and MREJ DETECTED (A) NOT DETECTED Final    Comment: CRITICAL RESULT CALLED TO, READ BACK BY AND VERIFIED WITHBETHA RANKIN DILLS [facility] 9542 09/30/24 HNM Performed at [facility], [address] [place], [place] [address]   MRSA Next Gen by PCR, Nasal     Status: Abnormal   Collection Time: 09/30/24  5:00 PM   Specimen: Nasal Mucosa; Nasal Swab  Result Value Ref Range Status   MRSA by PCR Next Gen DETECTED (A) NOT DETECTED Final    Comment: RESULT CALLED TO, READ BACK BY AND VERIFIED WITH: TAMMY ORLANDO 2032 09/30/24 MU (NOTE) The GeneXpert MRSA Assay (FDA approved for NASAL specimens only), is one component of a comprehensive MRSA colonization surveillance program. It is not intended to diagnose MRSA infection nor to guide or monitor treatment for MRSA  infections. Test performance is not FDA approved in patients less than 68 years old. Performed at [facility], [address] [place], [place] [address]     Coagulation Studies: Recent Labs    09/29/24 1437  LABPROT 14.4  INR 1.1    Urinalysis: Recent Labs    09/29/24 1417  COLORURINE AMBER*  LABSPEC 1.024  PHURINE 8.0  GLUCOSEU >=500*  HGBUR NEGATIVE  BILIRUBINUR NEGATIVE  KETONESUR NEGATIVE  PROTEINUR >=300*  NITRITE NEGATIVE  LEUKOCYTESUR MODERATE*      Imaging: PERIPHERAL VASCULAR CATHETERIZATION Result Date: 10/01/2024 See surgical note for result.  CT ABDOMEN PELVIS WO CONTRAST Result Date: 09/30/2024 EXAM: CT ABDOMEN AND PELVIS WITHOUT CONTRAST 09/30/2024 02:32:43 AM TECHNIQUE: CT of the abdomen and pelvis was performed without the administration of intravenous contrast. Multiplanar reformatted images are provided for review. Automated exposure control, iterative reconstruction, and/or weight-based adjustment of the mA/kV was utilized to reduce the radiation dose to as low as reasonably achievable. COMPARISON: 05/11/2024 CLINICAL HISTORY: Abdominal pain, acute, nonlocalized. FINDINGS: LOWER CHEST: Small bilateral pleural effusions. Bilateral rounded lower lobe airspace opacities could reflect rounded atelectasis or pneumonia. LIVER: The liver is unremarkable. GALLBLADDER AND BILE DUCTS: Gallbladder is unremarkable. No biliary ductal dilatation. SPLEEN: No acute abnormality. PANCREAS: No acute abnormality. ADRENAL GLANDS: No acute abnormality. KIDNEYS, URETERS AND BLADDER: No stones in the kidneys or ureters. No hydronephrosis. No perinephric or periureteral stranding. Suprapubic Foley catheter in place with urinary bladder decompressed. GI AND BOWEL: Stomach demonstrates no acute abnormality. Rectosigmoid wall thickening concerning for proctocolitis. No bowel obstruction. Normal appendix. PERITONEUM AND RETROPERITONEUM: No ascites. No free air. VASCULATURE: Aorta  is normal in caliber. Aortic atherosclerosis. LYMPH NODES: No lymphadenopathy. REPRODUCTIVE ORGANS: No acute abnormality. BONES AND SOFT TISSUES: Postoperative changes in the thoracic spine. Stable mild chronic compression fracture at T12 and L1. Umbilical hernia containing fat, stable. No focal soft tissue abnormality. IMPRESSION: 1. Rectosigmoid wall thickening concerning for proctocolitis. No bowel obstruction. Normal appendix. 2. Small bilateral pleural effusions. 3. Bilateral rounded lower lobe airspace opacities, possibly representing rounded atelectasis or pneumonia. Electronically signed by: Franky Crease MD 09/30/2024 02:45 AM EST RP Workstation: [id number]   DG Abd 2 Views Result Date: 09/29/2024 CLINICAL DATA:  Abdominal pain EXAM: DG ABDOMEN 2V COMPARISON:  05/12/2024 FINDINGS: Extensive hardware in the thoracic spine. Left greater than right pleural effusions and basilar airspace disease. Multiple moderate air-filled dilated loops of small bowel measuring up to 4.2 cm. IMPRESSION: 1. Moderate diffuse air distension of the bowel, potentially  due to ileus with partial or developing bowel obstruction not excluded. Consider radiographic follow-up. 2. Left greater than right pleural effusions and basilar airspace disease. Electronically Signed   By: Luke Bun M.D.   On: 09/29/2024 17:00   DG Chest Port 1 View Result Date: 09/29/2024 CLINICAL DATA:  Desaturation EXAM: PORTABLE CHEST 1 VIEW COMPARISON:  08/03/2024 FINDINGS: Right-sided central venous catheter tip at the cavoatrial region. Posterior spinal fusion hardware and interbody cage device. Cardiomegaly with vascular congestion and suspected mild interstitial edema. Left greater than right pleural effusions. IMPRESSION: Cardiomegaly with vascular congestion and suspected mild interstitial edema. Left greater than right pleural effusions. Electronically Signed   By: Luke Bun M.D.   On: 09/29/2024 15:21     Medications:    promethazine   (PHENERGAN ) injection (IM or IVPB) 6.25 mg (10/01/24 0935)    bisacodyl   10 mg Oral QHS   carvedilol   6.25 mg Oral BID WC   Chlorhexidine  Gluconate Cloth  6 each Topical Q0600   cyanocobalamin   1,000 mcg Oral Daily   diltiazem   240 mg Oral QPM   doxepin   10 mg Oral QHS   famotidine   10 mg Oral Daily   folic acid   1 mg Oral Daily   gabapentin   300 mg Oral QHS   heparin   5,000 Units Subcutaneous Q8H   insulin  aspart  0-5 Units Subcutaneous QHS   insulin  aspart  0-6 Units Subcutaneous TID WC   insulin  glargine  10 Units Subcutaneous QHS   lactulose   20 g Oral BID   levothyroxine   175 mcg Oral QHS   linaclotide   145 mcg Oral QAC breakfast   loratadine   10 mg Oral Daily   polyethylene glycol  17 g Oral BID   senna-docusate  1 tablet Oral BID   simethicone   80 mg Oral QID   vancomycin  variable dose per unstable renal function (pharmacist dosing)   Does not apply See admin instructions   acetaminophen  **OR** acetaminophen , alteplase , bisacodyl , guaiFENesin , heparin , ipratropium-albuterol , liver oil-zinc  oxide, ondansetron  **OR** ondansetron  (ZOFRAN ) IV, oxyCODONE , promethazine  (PHENERGAN ) injection (IM or IVPB)  Assessment/ Plan:  [name] is a 64 y.o.  male with past medical conditions including diabetes, hepatitis C, bilateral BKA, anxiety and depression, and end-stage renal disease on hemodialysis.  Patient presents to the emergency department from his facility complaining of shortness of breath.  Patient is currently admitted for SIRS (systemic inflammatory response syndrome) (HCC) [R65.10]  [facility]/MWF/right IJ PermCath/120.5 kg  End-stage renal disease on hemodialysis.  Received a 30 min treatment on 1/7, terminated early due to severe abd pain and respiratory distress. Attempted treatment today however patient refused due to continued pain. Due to infection, patient undergoing catheter free holiday so permcath must be removed. Will continue to monitor patient  status and labs.   Anemia of chronic kidney disease Lab Results  Component Value Date   HGB 9.9 (L) 09/30/2024   Hemoglobin acceptable.  No need for ESA's at this time.  3.  Systemic inflammatory response syndrome, suspected due to fever, leukocytosis, and tachypnea on ED arrival.  Suspected source of suprapubic catheter. Abdominal x-ray shows moderate diffuse air distention of the bowel suspicious for ileus.  Blood culture positive for MRSA. Vascular will remove permcath and attempt two day holiday. Cefepime  and vancomycin  ordered per primary team.  4. Secondary Hyperparathyroidism: with outpatient labs: None available  Lab Results  Component Value Date   PTH 177 (H) 04/23/2024   CALCIUM  8.2 (L) 09/30/2024  PHOS 4.1 08/05/2024    Calcium  stable. Will obtain updated phos level.    LOS: 2 Sanaia Jasso 1/8/20261:31 PM   "

## 2024-10-01 NOTE — Plan of Care (Signed)
 " Problem: Education: Goal: Ability to describe self-care measures that may prevent or decrease complications (Diabetes Survival Skills Education) will improve 10/01/2024 1642 by Joshua Maurilio SQUIBB, RN Outcome: Progressing 10/01/2024 1642 by Joshua Maurilio SQUIBB, RN Outcome: Progressing Goal: Individualized Educational Video(s) 10/01/2024 1642 by Joshua Maurilio SQUIBB, RN Outcome: Progressing 10/01/2024 1642 by Joshua Maurilio SQUIBB, RN Outcome: Progressing   Problem: Coping: Goal: Ability to adjust to condition or change in health will improve 10/01/2024 1642 by Joshua Maurilio SQUIBB, RN Outcome: Progressing 10/01/2024 1642 by Joshua Maurilio SQUIBB, RN Outcome: Progressing   Problem: Health Behavior/Discharge Planning: Goal: Ability to identify and utilize available resources and services will improve 10/01/2024 1642 by Joshua Maurilio SQUIBB, RN Outcome: Progressing 10/01/2024 1642 by Joshua Maurilio SQUIBB, RN Outcome: Progressing   Problem: Metabolic: Goal: Ability to maintain appropriate glucose levels will improve 10/01/2024 1642 by Joshua Maurilio SQUIBB, RN Outcome: Progressing 10/01/2024 1642 by Joshua Maurilio SQUIBB, RN Outcome: Progressing   Problem: Nutritional: Goal: Progress toward achieving an optimal weight will improve 10/01/2024 1642 by Joshua Maurilio SQUIBB, RN Outcome: Progressing 10/01/2024 1642 by Joshua Maurilio SQUIBB, RN Outcome: Progressing   Problem: Skin Integrity: Goal: Risk for impaired skin integrity will decrease 10/01/2024 1642 by Joshua Maurilio SQUIBB, RN Outcome: Progressing 10/01/2024 1642 by Joshua Maurilio SQUIBB, RN Outcome: Progressing   Problem: Tissue Perfusion: Goal: Adequacy of tissue perfusion will improve 10/01/2024 1642 by Joshua Maurilio SQUIBB, RN Outcome: Progressing 10/01/2024 1642 by Joshua Maurilio SQUIBB, RN Outcome: Progressing   Problem: Education: Goal: Knowledge of General Education information will improve Description: Including pain rating scale, medication(s)/side effects and non-pharmacologic comfort measures 10/01/2024 1642 by Joshua Maurilio SQUIBB, RN Outcome:  Progressing 10/01/2024 1642 by Joshua Maurilio SQUIBB, RN Outcome: Progressing   Problem: Health Behavior/Discharge Planning: Goal: Ability to manage health-related needs will improve 10/01/2024 1642 by Joshua Maurilio SQUIBB, RN Outcome: Progressing 10/01/2024 1642 by Joshua Maurilio SQUIBB, RN Outcome: Progressing   Problem: Clinical Measurements: Goal: Ability to maintain clinical measurements within normal limits will improve 10/01/2024 1642 by Joshua Maurilio SQUIBB, RN Outcome: Progressing 10/01/2024 1642 by Joshua Maurilio SQUIBB, RN Outcome: Progressing Goal: Will remain free from infection 10/01/2024 1642 by Joshua Maurilio SQUIBB, RN Outcome: Progressing 10/01/2024 1642 by Joshua Maurilio SQUIBB, RN Outcome: Progressing Goal: Diagnostic test results will improve 10/01/2024 1642 by Joshua Maurilio SQUIBB, RN Outcome: Progressing 10/01/2024 1642 by Joshua Maurilio SQUIBB, RN Outcome: Progressing Goal: Respiratory complications will improve 10/01/2024 1642 by Joshua Maurilio SQUIBB, RN Outcome: Progressing 10/01/2024 1642 by Joshua Maurilio SQUIBB, RN Outcome: Progressing Goal: Cardiovascular complication will be avoided 10/01/2024 1642 by Joshua Maurilio SQUIBB, RN Outcome: Progressing 10/01/2024 1642 by Joshua Maurilio SQUIBB, RN Outcome: Progressing   Problem: Coping: Goal: Level of anxiety will decrease 10/01/2024 1642 by Joshua Maurilio SQUIBB, RN Outcome: Progressing 10/01/2024 1642 by Joshua Maurilio SQUIBB, RN Outcome: Progressing   Problem: Elimination: Goal: Will not experience complications related to urinary retention 10/01/2024 1642 by Joshua Maurilio SQUIBB, RN Outcome: Progressing 10/01/2024 1642 by Joshua Maurilio SQUIBB, RN Outcome: Progressing   Problem: Pain Managment: Goal: General experience of comfort will improve and/or be controlled 10/01/2024 1642 by Joshua Maurilio SQUIBB, RN Outcome: Progressing 10/01/2024 1642 by Joshua Maurilio SQUIBB, RN Outcome: Progressing   Problem: Safety: Goal: Ability to remain free from injury will improve 10/01/2024 1642 by Joshua Maurilio SQUIBB, RN Outcome: Progressing 10/01/2024 1642 by Joshua Maurilio SQUIBB,  RN Outcome: Progressing   Problem: Skin Integrity: Goal: Risk for impaired skin integrity will decrease 10/01/2024 1642 by Joshua Maurilio SQUIBB,  RN Outcome: Progressing 10/01/2024 1642 by Joshua Maurilio SQUIBB, RN Outcome: Progressing   Problem: Fluid Volume: Goal: Ability to maintain a balanced intake and output will improve 10/01/2024 1642 by Joshua Maurilio SQUIBB, RN Outcome: Not Progressing 10/01/2024 1642 by Joshua Maurilio SQUIBB, RN Outcome: Progressing   Problem: Health Behavior/Discharge Planning: Goal: Ability to manage health-related needs will improve 10/01/2024 1642 by Joshua Maurilio SQUIBB, RN Outcome: Not Progressing 10/01/2024 1642 by Joshua Maurilio SQUIBB, RN Outcome: Progressing   Problem: Nutritional: Goal: Maintenance of adequate nutrition will improve 10/01/2024 1642 by Joshua Maurilio SQUIBB, RN Outcome: Not Progressing 10/01/2024 1642 by Joshua Maurilio SQUIBB, RN Outcome: Progressing   Problem: Activity: Goal: Risk for activity intolerance will decrease 10/01/2024 1642 by Joshua Maurilio SQUIBB, RN Outcome: Not Progressing 10/01/2024 1642 by Joshua Maurilio SQUIBB, RN Outcome: Progressing   Problem: Nutrition: Goal: Adequate nutrition will be maintained 10/01/2024 1642 by Joshua Maurilio SQUIBB, RN Outcome: Not Progressing 10/01/2024 1642 by Joshua Maurilio SQUIBB, RN Outcome: Progressing   Problem: Elimination: Goal: Will not experience complications related to bowel motility 10/01/2024 1642 by Joshua Maurilio SQUIBB, RN Outcome: Not Progressing 10/01/2024 1642 by Joshua Maurilio SQUIBB, RN Outcome: Progressing   "

## 2024-10-01 NOTE — Progress Notes (Signed)
 Dialysis called for report, report given. Transport called and asked to take patient I agreed. Transport went in and patient stated no no no multiple times. He called the dialysis nurse to further explain he said I have the right to refuse and I don't want it.   MD aware.

## 2024-10-01 NOTE — Inpatient Diabetes Management (Signed)
 Inpatient Diabetes Program Recommendations  AACE/ADA: New Consensus Statement on Inpatient Glycemic Control   Target Ranges:  Prepandial:   less than 140 mg/dL      Peak postprandial:   less than 180 mg/dL (1-2 hours)      Critically ill patients:  140 - 180 mg/dL    Latest Reference Range & Units 09/30/24 09:14 09/30/24 11:32 09/30/24 16:30 09/30/24 22:01 10/01/24 08:19  Glucose-Capillary 70 - 99 mg/dL 653 (H) 732 (H) 732 (H) 271 (H) 213 (H)   Review of Glycemic Control  Diabetes history: DM2 Outpatient Diabetes medications: Humalog  0-15 units QID, Semglee  20 units BID, Januvia 25 mg daily Current orders for Inpatient glycemic control: Lantus  10 units at bedtime, Novolog  0-6 units TID with meals, Novolog  0-5 units QHS  Inpatient Diabetes Program Recommendations:    Insulin : CBG 213 mg/dl today. Please consider increasing Lantus  to 13 units at bedtime and adding Novolog  3 units TID with meals for meal coverage if patient eats at least 50% of meals.  Thanks, Earnie Gainer, RN, MSN, CDCES Diabetes Coordinator Inpatient Diabetes Program [phone] (Team Pager from 8am to 5pm)

## 2024-10-01 NOTE — Op Note (Signed)
"                                           Operative Note     Preoperative diagnosis:   1. ESRD with bacteremia  Postoperative diagnosis:  1. ESRD with bacteremia  Procedure:  Removal of right jugular Permcath  Surgeon:  Selinda Gu, MD  Anesthesia:  Local  EBL:  Minimal  Indication for the Procedure:  The patient has bacteremia with his end-stage renal disease and presence of a PermCath so this must be removed.  Risks and benefits are discussed and informed consent is obtained from his brother.  Description of the Procedure:  The patient's right neck, chest and existing catheter were sterilely prepped and draped. The area around the catheter was anesthetized copiously with 1% lidocaine . The catheter was dissected out with curved hemostats until the cuff was freed from the surrounding fibrous sheath. The fiber sheath was transected, and the catheter was then removed in its entirety using gentle traction. Pressure was held and sterile dressings were placed. The patient tolerated the procedure well and was taken to the recovery room in stable condition.  Cath tip was sent for culture.     Selinda Gu  10/01/2024, 10:53 AM This note was created with Dragon Medical transcription system. Any errors in dictation are purely unintentional. "

## 2024-10-01 NOTE — Plan of Care (Signed)

## 2024-10-01 NOTE — TOC Progression Note (Signed)
 Transition of Care [facility]) - Progression Note    Patient Details  Name: Ellis Mehaffey MRN: [id number] Date of Birth: Jan 24, 1961  Transition of Care [facility]) CM/SW Contact  Dalia GORMAN Fuse, RN Phone Number: 10/01/2024, 12:55 PM  Clinical Narrative:    To the OR today for removal of Right jugular Permcath. Tip for sent for culture. Continues on IV abx.  No TOC needs at this time.                       Expected Discharge Plan and Services                                               Social Drivers of Health (SDOH) Interventions SDOH Screenings   Food Insecurity: No Food Insecurity (09/29/2024)  Housing: Low Risk (09/29/2024)  Transportation Needs: No Transportation Needs (09/29/2024)  Utilities: Not At Risk (09/29/2024)  Social Connections: Socially Isolated (09/29/2024)  Tobacco Use: Medium Risk (09/29/2024)    Readmission Risk Interventions    05/05/2024    3:05 PM  Readmission Risk Prevention Plan  Transportation Screening Complete  Medication Review (RN Care Manager) Complete  PCP or Specialist appointment within 3-5 days of discharge Complete  SW Recovery Care/Counseling Consult Complete  Palliative Care Screening Not Applicable  Skilled Nursing Facility Complete

## 2024-10-01 NOTE — Progress Notes (Signed)
 "  Date of Admission:  09/29/2024      ID: Eugene [name] is a 64 y.o. male  Principal Problem:   SIRS (systemic inflammatory response syndrome) (HCC) Active Problems:   Uncontrolled type 2 diabetes mellitus with hyperglycemia, with long-term current use of insulin  (HCC)   Chronic pain   Abdominal discomfort   Hypothyroidism   Paroxysmal SVT (supraventricular tachycardia)   ESRD (end stage renal disease) (HCC)   Chronic respiratory failure with hypoxia (HCC)   Chronic bilateral pleural effusions   Heart failure with preserved ejection fraction (HCC)   Chronic constipation   Obesity (BMI 30-39.9)    Subjective: Pt had dialysis cath removed today Still c/o nausea  Medications:   bisacodyl   10 mg Oral QHS   carvedilol   6.25 mg Oral BID WC   Chlorhexidine  Gluconate Cloth  6 each Topical Q0600   cyanocobalamin   1,000 mcg Oral Daily   diltiazem   240 mg Oral QPM   doxepin   10 mg Oral QHS   famotidine   10 mg Oral Daily   folic acid   1 mg Oral Daily   gabapentin   300 mg Oral QHS   heparin   5,000 Units Subcutaneous Q8H   insulin  aspart  0-5 Units Subcutaneous QHS   insulin  aspart  0-6 Units Subcutaneous TID WC   insulin  glargine  10 Units Subcutaneous QHS   lactulose   20 g Oral BID   levothyroxine   175 mcg Oral QHS   linaclotide   145 mcg Oral QAC breakfast   loratadine   10 mg Oral Daily   polyethylene glycol  17 g Oral BID   senna-docusate  1 tablet Oral BID   simethicone   80 mg Oral QID   vancomycin  variable dose per unstable renal function (pharmacist dosing)   Does not apply See admin instructions    Objective: Vital signs in last 24 hours: Patient Vitals for the past 24 hrs:  BP Temp Temp src Pulse Resp SpO2  10/01/24 1054 (!) 157/79 99 F (37.2 C) Temporal 85 -- 96 %  10/01/24 1000 (!) 164/77 99 F (37.2 C) Temporal 83 -- 92 %  10/01/24 0404 137/70 100.2 F (37.9 C) Oral 76 20 90 %  10/01/24 0008 (!) 160/78 (!) 97.5 F (36.4 C) Oral 79 16 94 %  09/30/24 2020 --  -- -- -- -- 90 %  09/30/24 1934 (!) 141/64 98.6 F (37 C) Oral 84 20 91 %  09/30/24 1628 (!) 174/77 99 F (37.2 C) -- 84 20 94 %     PHYSICAL EXAM:  General: lethargic- arousable Lungs: Clear to auscultation bilaterally. No Wheezing or Rhonchi. No rales. Heart: Regular rate and rhythm, no murmur, rub or gallop. Abdomen: Soft, non-tender,not distended. Bowel sounds normal. No masses Extremities: b/l bka Skin: No rashes or lesions. Or bruising Lymph: Cervical, supraclavicular normal. Neurologic: Grossly non-focal  Lab Results    Latest Ref Rng & Units 10/01/2024    1:30 PM 09/30/2024    4:25 AM 09/29/2024    2:37 PM  CBC  WBC 4.0 - 10.5 K/uL 10.8  16.8  26.4   Hemoglobin 13.0 - 17.0 g/dL 89.5  9.9  88.4   Hematocrit 39.0 - 52.0 % 32.3  32.7  36.1   Platelets 150 - 400 K/uL 93  118  139        Latest Ref Rng & Units 10/01/2024    1:30 PM 09/30/2024    7:27 AM 09/29/2024    2:37 PM  CMP  Glucose 70 -  99 mg/dL 738  601  743   BUN 8 - 23 mg/dL 65  70  60   Creatinine 0.61 - 1.24 mg/dL 5.52  5.18  5.62   Sodium 135 - 145 mmol/L 138  136  139   Potassium 3.5 - 5.1 mmol/L 4.3  5.1  4.3   Chloride 98 - 111 mmol/L 102  101  102   CO2 22 - 32 mmol/L 23  22  24    Calcium  8.9 - 10.3 mg/dL 8.1  8.2  8.3   Total Protein 6.5 - 8.1 g/dL   6.5   Total Bilirubin 0.0 - 1.2 mg/dL   0.4   Alkaline Phos 38 - 126 U/L   83   AST 15 - 41 U/L   21   ALT 0 - 44 U/L   28       Microbiology: 1/6 BC MRSA Studies/Results: PERIPHERAL VASCULAR CATHETERIZATION Result Date: 10/01/2024 See surgical note for result.  CT ABDOMEN PELVIS WO CONTRAST Result Date: 09/30/2024 EXAM: CT ABDOMEN AND PELVIS WITHOUT CONTRAST 09/30/2024 02:32:43 AM TECHNIQUE: CT of the abdomen and pelvis was performed without the administration of intravenous contrast. Multiplanar reformatted images are provided for review. Automated exposure control, iterative reconstruction, and/or weight-based adjustment of the mA/kV was utilized to  reduce the radiation dose to as low as reasonably achievable. COMPARISON: 05/11/2024 CLINICAL HISTORY: Abdominal pain, acute, nonlocalized. FINDINGS: LOWER CHEST: Small bilateral pleural effusions. Bilateral rounded lower lobe airspace opacities could reflect rounded atelectasis or pneumonia. LIVER: The liver is unremarkable. GALLBLADDER AND BILE DUCTS: Gallbladder is unremarkable. No biliary ductal dilatation. SPLEEN: No acute abnormality. PANCREAS: No acute abnormality. ADRENAL GLANDS: No acute abnormality. KIDNEYS, URETERS AND BLADDER: No stones in the kidneys or ureters. No hydronephrosis. No perinephric or periureteral stranding. Suprapubic Foley catheter in place with urinary bladder decompressed. GI AND BOWEL: Stomach demonstrates no acute abnormality. Rectosigmoid wall thickening concerning for proctocolitis. No bowel obstruction. Normal appendix. PERITONEUM AND RETROPERITONEUM: No ascites. No free air. VASCULATURE: Aorta is normal in caliber. Aortic atherosclerosis. LYMPH NODES: No lymphadenopathy. REPRODUCTIVE ORGANS: No acute abnormality. BONES AND SOFT TISSUES: Postoperative changes in the thoracic spine. Stable mild chronic compression fracture at T12 and L1. Umbilical hernia containing fat, stable. No focal soft tissue abnormality. IMPRESSION: 1. Rectosigmoid wall thickening concerning for proctocolitis. No bowel obstruction. Normal appendix. 2. Small bilateral pleural effusions. 3. Bilateral rounded lower lobe airspace opacities, possibly representing rounded atelectasis or pneumonia. Electronically signed by: Franky Crease MD 09/30/2024 02:45 AM EST RP Workstation: [id number]   DG Abd 2 Views Result Date: 09/29/2024 CLINICAL DATA:  Abdominal pain EXAM: DG ABDOMEN 2V COMPARISON:  05/12/2024 FINDINGS: Extensive hardware in the thoracic spine. Left greater than right pleural effusions and basilar airspace disease. Multiple moderate air-filled dilated loops of small bowel measuring up to 4.2 cm.  IMPRESSION: 1. Moderate diffuse air distension of the bowel, potentially due to ileus with partial or developing bowel obstruction not excluded. Consider radiographic follow-up. 2. Left greater than right pleural effusions and basilar airspace disease. Electronically Signed   By: Luke Bun M.D.   On: 09/29/2024 17:00   DG Chest Port 1 View Result Date: 09/29/2024 CLINICAL DATA:  Desaturation EXAM: PORTABLE CHEST 1 VIEW COMPARISON:  08/03/2024 FINDINGS: Right-sided central venous catheter tip at the cavoatrial region. Posterior spinal fusion hardware and interbody cage device. Cardiomegaly with vascular congestion and suspected mild interstitial edema. Left greater than right pleural effusions. IMPRESSION: Cardiomegaly with vascular congestion and suspected mild interstitial edema. Left greater  than right pleural effusions. Electronically Signed   By: Luke Bun M.D.   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

## 2024-10-02 ENCOUNTER — Inpatient Hospital Stay
Admit: 2024-10-02 | Discharge: 2024-10-02 | Disposition: A | Attending: Infectious Diseases | Admitting: Infectious Diseases

## 2024-10-02 DIAGNOSIS — N186 End stage renal disease: Secondary | ICD-10-CM | POA: Diagnosis not present

## 2024-10-02 DIAGNOSIS — R7881 Bacteremia: Secondary | ICD-10-CM | POA: Diagnosis not present

## 2024-10-02 DIAGNOSIS — B9562 Methicillin resistant Staphylococcus aureus infection as the cause of diseases classified elsewhere: Secondary | ICD-10-CM | POA: Diagnosis not present

## 2024-10-02 DIAGNOSIS — E1122 Type 2 diabetes mellitus with diabetic chronic kidney disease: Secondary | ICD-10-CM | POA: Diagnosis not present

## 2024-10-02 LAB — BASIC METABOLIC PANEL WITH GFR
Anion gap: 15 (ref 5–15)
BUN: 66 mg/dL — ABNORMAL HIGH (ref 8–23)
CO2: 22 mmol/L (ref 22–32)
Calcium: 7.6 mg/dL — ABNORMAL LOW (ref 8.9–10.3)
Chloride: 104 mmol/L (ref 98–111)
Creatinine, Ser: 4.73 mg/dL — ABNORMAL HIGH (ref 0.61–1.24)
GFR, Estimated: 13 mL/min — ABNORMAL LOW
Glucose, Bld: 203 mg/dL — ABNORMAL HIGH (ref 70–99)
Potassium: 4 mmol/L (ref 3.5–5.1)
Sodium: 140 mmol/L (ref 135–145)

## 2024-10-02 LAB — URINE CULTURE: Culture: >=100000 — AB

## 2024-10-02 LAB — ECHOCARDIOGRAM COMPLETE
Height: 70 in
S' Lateral: 1.93 cm
Weight: 4359.82 [oz_av]

## 2024-10-02 LAB — CULTURE, BLOOD (ROUTINE X 2)

## 2024-10-02 LAB — PHOSPHORUS: Phosphorus: 5 mg/dL — ABNORMAL HIGH (ref 2.5–4.6)

## 2024-10-02 LAB — GLUCOSE, CAPILLARY
Glucose-Capillary: 207 mg/dL — ABNORMAL HIGH (ref 70–99)
Glucose-Capillary: 178 mg/dL — ABNORMAL HIGH (ref 70–99)
Glucose-Capillary: 232 mg/dL — ABNORMAL HIGH (ref 70–99)
Glucose-Capillary: 221 mg/dL — ABNORMAL HIGH (ref 70–99)

## 2024-10-02 LAB — VANCOMYCIN, RANDOM: Vancomycin Rm: 18 ug/mL

## 2024-10-02 MED ORDER — METOCLOPRAMIDE HCL 10 MG/10ML PO SOLN
5.0000 mg | Freq: Two times a day (BID) | ORAL | Status: DC
  Administered 2024-10-02 (×2): 5 mg via ORAL
  Filled 2024-10-02 (×5): qty 5

## 2024-10-02 MED ORDER — INSULIN ASPART 100 UNIT/ML IJ SOLN
4.0000 [IU] | Freq: Three times a day (TID) | INTRAMUSCULAR | Status: DC
  Administered 2024-10-02 – 2024-10-03 (×4): 4 [IU] via SUBCUTANEOUS
  Filled 2024-10-02 (×3): qty 4

## 2024-10-02 MED ORDER — VANCOMYCIN HCL IN DEXTROSE 1-5 GM/200ML-% IV SOLN
1000.0000 mg | Freq: Once | INTRAVENOUS | Status: AC
  Administered 2024-10-02: 1000 mg via INTRAVENOUS
  Filled 2024-10-02: qty 200

## 2024-10-02 MED ORDER — INSULIN GLARGINE 100 UNIT/ML ~~LOC~~ SOLN
15.0000 [IU] | Freq: Every day | SUBCUTANEOUS | Status: DC
  Administered 2024-10-02: 15 [IU] via SUBCUTANEOUS
  Filled 2024-10-02: qty 0.15

## 2024-10-02 MED ORDER — LACTULOSE 10 GM/15ML PO SOLN
20.0000 g | Freq: Three times a day (TID) | ORAL | Status: AC
  Administered 2024-10-02 – 2024-10-03 (×2): 20 g via ORAL
  Filled 2024-10-02 (×4): qty 30

## 2024-10-02 NOTE — Progress Notes (Signed)
 "  Date of Admission:  09/29/2024      ID: Eugene [name] is a 64 y.o. male  Principal Problem:   SIRS (systemic inflammatory response syndrome) (HCC) Active Problems:   Uncontrolled type 2 diabetes mellitus with hyperglycemia, with long-term current use of insulin  (HCC)   Chronic pain   Abdominal discomfort   Hypothyroidism   Paroxysmal SVT (supraventricular tachycardia)   ESRD (end stage renal disease) (HCC)   Chronic respiratory failure with hypoxia (HCC)   Chronic bilateral pleural effusions   Heart failure with preserved ejection fraction (HCC)   Chronic constipation   Obesity (BMI 30-39.9)   MRSA bacteremia    Subjective: Pt has not had a bowel movt in 6 days Still nauseous   Medications:   bisacodyl   10 mg Oral QHS   carvedilol   6.25 mg Oral BID WC   Chlorhexidine  Gluconate Cloth  6 each Topical Q0600   cyanocobalamin   1,000 mcg Oral Daily   diltiazem   240 mg Oral QPM   doxepin   10 mg Oral QHS   famotidine   10 mg Oral Daily   folic acid   1 mg Oral Daily   gabapentin   300 mg Oral QHS   heparin   5,000 Units Subcutaneous Q8H   insulin  aspart  0-5 Units Subcutaneous QHS   insulin  aspart  0-6 Units Subcutaneous TID WC   insulin  aspart  4 Units Subcutaneous TID WC   insulin  glargine  15 Units Subcutaneous QHS   lactulose   20 g Oral BID   levothyroxine   175 mcg Oral QHS   linaclotide   145 mcg Oral QAC breakfast   loratadine   10 mg Oral Daily   metoCLOPramide   5 mg Oral BID AC   polyethylene glycol  17 g Oral BID   senna-docusate  1 tablet Oral BID   simethicone   80 mg Oral QID   vancomycin  variable dose per unstable renal function (pharmacist dosing)   Does not apply See admin instructions    Objective: Vital signs in last 24 hours: Patient Vitals for the past 24 hrs:  BP Temp Temp src Pulse Resp SpO2  10/02/24 1139 (!) 153/70 99.3 F (37.4 C) -- 75 -- 93 %  10/02/24 0755 (!) 147/68 99.1 F (37.3 C) Oral 79 16 94 %  10/02/24 0731 -- -- -- -- -- 91 %   10/02/24 0405 139/75 98.5 F (36.9 C) Oral 78 16 94 %  10/01/24 1938 139/75 99.1 F (37.3 C) Oral 75 16 91 %  10/01/24 1531 (!) 150/78 99.3 F (37.4 C) Oral 80 18 94 %     PHYSICAL EXAM:  General: lethargic- arousable Lungs: Clear to auscultation bilaterally. No Wheezing or Rhonchi. No rales. Heart: Regular rate and rhythm, no murmur, rub or gallop. Abdomen: Soft, non-tender,not distended. Bowel sounds normal. No masses Extremities: b/l bka Skin: No rashes or lesions. Or bruising Lymph: Cervical, supraclavicular normal. Neurologic: Grossly non-focal  Lab Results    Latest Ref Rng & Units 10/01/2024    1:30 PM 09/30/2024    4:25 AM 09/29/2024    2:37 PM  CBC  WBC 4.0 - 10.5 K/uL 10.8  16.8  26.4   Hemoglobin 13.0 - 17.0 g/dL 89.5  9.9  88.4   Hematocrit 39.0 - 52.0 % 32.3  32.7  36.1   Platelets 150 - 400 K/uL 93  118  139        Latest Ref Rng & Units 10/02/2024    6:27 AM 10/01/2024    1:30 PM  09/30/2024    7:27 AM  CMP  Glucose 70 - 99 mg/dL 796  738  601   BUN 8 - 23 mg/dL 66  65  70   Creatinine 0.61 - 1.24 mg/dL 5.26  5.52  5.18   Sodium 135 - 145 mmol/L 140  138  136   Potassium 3.5 - 5.1 mmol/L 4.0  4.3  5.1   Chloride 98 - 111 mmol/L 104  102  101   CO2 22 - 32 mmol/L 22  23  22    Calcium  8.9 - 10.3 mg/dL 7.6  8.1  8.2       Microbiology: 1/6 BC MRSA Studies/Results: PERIPHERAL VASCULAR CATHETERIZATION Result Date: 10/01/2024 See surgical note for result.    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

## 2024-10-02 NOTE — Progress Notes (Addendum)
 " [facility]  ROUNDING NOTE   Subjective:   Eugene [name] is a 64 year old male with past medical conditions including diabetes, hepatitis C, bilateral BKA, anxiety and depression, and end-stage renal disease on hemodialysis.  Patient presents to the emergency department from his facility complaining of shortness of breath.  Patient is currently admitted for SIRS (systemic inflammatory response syndrome) (HCC) [R65.10]  Patient is known to our practice and receives outpatient dialysis treatments at [facility] on a MWF schedule, supervised by Dr. Dennise.    Patient seen resting in bed Easily aroused Continues to have abd discomfort States he has not had a BM  Objective:  Vital signs in last 24 hours:  Temp:  [98.5 F (36.9 C)-99.3 F (37.4 C)] 99.3 F (37.4 C) (01/09 1139) Pulse Rate:  [75-80] 75 (01/09 1139) Resp:  [16-18] 16 (01/09 0755) BP: (139-153)/(68-78) 153/70 (01/09 1139) SpO2:  [91 %-94 %] 93 % (01/09 1139)  Weight change:  Filed Weights   09/29/24 1412 09/30/24 1043  Weight: 119 kg 123.6 kg    Intake/Output: I/O last 3 completed shifts: In: -  Out: 1950 [Urine:1950]   Intake/Output this shift:  No intake/output data recorded.  Physical Exam: General: ill-appearing  Head: Normocephalic, atraumatic. Moist oral mucosal membranes  Eyes: Anicteric  Lungs:  Coarse/wheeze, [place] O2  Heart: Regular rate and rhythm  Abdomen:  Soft, nontender  Extremities: No peripheral edema.  Bilateral BKA  Neurologic: Awake, alert, conversant  Skin: Warm,dry, no rash  Access: None-line holiday    Basic Metabolic Panel: Recent Labs  Lab 09/29/24 1437 09/30/24 0727 10/01/24 1330 10/02/24 0627  NA 139 136 138 140  K 4.3 5.1 4.3 4.0  CL 102 101 102 104  CO2 24 22 23 22   GLUCOSE 256* 398* 261* 203*  BUN 60* 70* 65* 66*  CREATININE 4.37* 4.81* 4.47* 4.73*  CALCIUM  8.3* 8.2* 8.1* 7.6*    Liver Function Tests: Recent Labs  Lab 09/29/24 1437  AST 21   ALT 28  ALKPHOS 83  BILITOT 0.4  PROT 6.5  ALBUMIN  3.2*   Recent Labs  Lab 09/30/24 0727  LIPASE <10*   No results for input(s): AMMONIA in the last 168 hours.  CBC: Recent Labs  Lab 09/29/24 1437 09/30/24 0425 10/01/24 1330  WBC 26.4* 16.8* 10.8*  NEUTROABS 23.2*  --  9.3*  HGB 11.5* 9.9* 10.4*  HCT 36.1* 32.7* 32.3*  MCV 94.0 97.0 93.1  PLT 139* 118* 93*    Cardiac Enzymes: No results for input(s): CKTOTAL, CKMB, CKMBINDEX, TROPONINI in the last 168 hours.  BNP: Invalid input(s): POCBNP  CBG: Recent Labs  Lab 10/01/24 1142 10/01/24 1533 10/01/24 2116 10/02/24 0757 10/02/24 1141  GLUCAP 221* 238* 236* 207* 178*    Microbiology: Results for orders placed or performed during the hospital encounter of 09/29/24  Resp panel by RT-PCR (RSV, Flu A&B, Covid) Anterior Nasal Swab     Status: None   Collection Time: 09/29/24  2:16 PM   Specimen: Anterior Nasal Swab  Result Value Ref Range Status   SARS Coronavirus 2 by RT PCR NEGATIVE NEGATIVE Final    Comment: (NOTE) SARS-CoV-2 target nucleic acids are NOT DETECTED.  The SARS-CoV-2 RNA is generally detectable in upper respiratory specimens during the acute phase of infection. The lowest concentration of SARS-CoV-2 viral copies this assay can detect is 138 copies/mL. A negative result does not preclude SARS-Cov-2 infection and should not be used as the sole basis for treatment or other patient  management decisions. A negative result may occur with  improper specimen collection/handling, submission of specimen other than nasopharyngeal swab, presence of viral mutation(s) within the areas targeted by this assay, and inadequate number of viral copies(<138 copies/mL). A negative result must be combined with clinical observations, patient history, and epidemiological information. The expected result is Negative.  Fact Sheet for Patients:  bloggercourse.com  Fact Sheet for  Healthcare Providers:  seriousbroker.it  This test is no t yet approved or cleared by the [place]  FDA and  has been authorized for detection and/or diagnosis of SARS-CoV-2 by FDA under an Emergency Use Authorization (EUA). This EUA will remain  in effect (meaning this test can be used) for the duration of the COVID-19 declaration under Section 564(b)(1) of the Act, 21 U.S.C.section 360bbb-3(b)(1), unless the authorization is terminated  or revoked sooner.       Influenza A by PCR NEGATIVE NEGATIVE Final   Influenza B by PCR NEGATIVE NEGATIVE Final    Comment: (NOTE) The Xpert Xpress SARS-CoV-2/FLU/RSV plus assay is intended as an aid in the diagnosis of influenza from Nasopharyngeal swab specimens and should not be used as a sole basis for treatment. Nasal washings and aspirates are unacceptable for Xpert Xpress SARS-CoV-2/FLU/RSV testing.  Fact Sheet for Patients: bloggercourse.com  Fact Sheet for Healthcare Providers: seriousbroker.it  This test is not yet approved or cleared by the [place]  FDA and has been authorized for detection and/or diagnosis of SARS-CoV-2 by FDA under an Emergency Use Authorization (EUA). This EUA will remain in effect (meaning this test can be used) for the duration of the COVID-19 declaration under Section 564(b)(1) of the Act, 21 U.S.C. section 360bbb-3(b)(1), unless the authorization is terminated or revoked.     Resp Syncytial Virus by PCR NEGATIVE NEGATIVE Final    Comment: (NOTE) Fact Sheet for Patients: bloggercourse.com  Fact Sheet for Healthcare Providers: seriousbroker.it  This test is not yet approved or cleared by the [place]  FDA and has been authorized for detection and/or diagnosis of SARS-CoV-2 by FDA under an Emergency Use Authorization (EUA). This EUA will remain in effect (meaning this  test can be used) for the duration of the COVID-19 declaration under Section 564(b)(1) of the Act, 21 U.S.C. section 360bbb-3(b)(1), unless the authorization is terminated or revoked.  Performed at [facility], [address] [place], [place] [address]   Urine Culture     Status: Abnormal   Collection Time: 09/29/24  2:17 PM   Specimen: Urine, Random  Result Value Ref Range Status   Specimen Description   Final    URINE, RANDOM Performed at [facility], [address] [place], [place] [address]    Special Requests   Final    NONE Reflexed from [id number] Performed at [facility], [address] [place], [place] [address]    Culture (A)  Final    >=100,000 COLONIES/mL PROTEUS MIRABILIS >=100,000 COLONIES/mL METHICILLIN RESISTANT STAPHYLOCOCCUS AUREUS    Report Status 10/02/2024 FINAL  Final   Organism ID, Bacteria PROTEUS MIRABILIS (A)  Final   Organism ID, Bacteria METHICILLIN RESISTANT STAPHYLOCOCCUS AUREUS (A)  Final      Susceptibility   Methicillin resistant staphylococcus aureus - MIC*    CIPROFLOXACIN  >=8 RESISTANT Resistant     GENTAMICIN <=0.5 SENSITIVE Sensitive     NITROFURANTOIN <=16 SENSITIVE Sensitive     OXACILLIN >=4 RESISTANT Resistant     TETRACYCLINE <=1 SENSITIVE Sensitive     VANCOMYCIN  1 SENSITIVE Sensitive  TRIMETH /SULFA  <=10 SENSITIVE Sensitive     RIFAMPIN  <=0.5 SENSITIVE Sensitive     Inducible Clindamycin NEGATIVE Sensitive     LINEZOLID  2 SENSITIVE Sensitive     * >=100,000 COLONIES/mL METHICILLIN RESISTANT STAPHYLOCOCCUS AUREUS   Proteus mirabilis - MIC*    AMPICILLIN <=2 SENSITIVE Sensitive     CEFAZOLIN  (URINE) Value in next row Sensitive      4 SENSITIVEThis is a modified FDA-approved test that has been validated and its performance characteristics determined by the reporting laboratory.  This laboratory is certified under the Clinical Laboratory Improvement Amendments CLIA as qualified to perform high  complexity clinical laboratory testing.    CEFEPIME  Value in next row Sensitive      4 SENSITIVEThis is a modified FDA-approved test that has been validated and its performance characteristics determined by the reporting laboratory.  This laboratory is certified under the Clinical Laboratory Improvement Amendments CLIA as qualified to perform high complexity clinical laboratory testing.    ERTAPENEM Value in next row Sensitive      4 SENSITIVEThis is a modified FDA-approved test that has been validated and its performance characteristics determined by the reporting laboratory.  This laboratory is certified under the Clinical Laboratory Improvement Amendments CLIA as qualified to perform high complexity clinical laboratory testing.    CEFTRIAXONE  Value in next row Sensitive      4 SENSITIVEThis is a modified FDA-approved test that has been validated and its performance characteristics determined by the reporting laboratory.  This laboratory is certified under the Clinical Laboratory Improvement Amendments CLIA as qualified to perform high complexity clinical laboratory testing.    CIPROFLOXACIN  Value in next row Resistant      4 SENSITIVEThis is a modified FDA-approved test that has been validated and its performance characteristics determined by the reporting laboratory.  This laboratory is certified under the Clinical Laboratory Improvement Amendments CLIA as qualified to perform high complexity clinical laboratory testing.    GENTAMICIN Value in next row Sensitive      4 SENSITIVEThis is a modified FDA-approved test that has been validated and its performance characteristics determined by the reporting laboratory.  This laboratory is certified under the Clinical Laboratory Improvement Amendments CLIA as qualified to perform high complexity clinical laboratory testing.    NITROFURANTOIN Value in next row Resistant      4 SENSITIVEThis is a modified FDA-approved test that has been validated and its  performance characteristics determined by the reporting laboratory.  This laboratory is certified under the Clinical Laboratory Improvement Amendments CLIA as qualified to perform high complexity clinical laboratory testing.    TRIMETH /SULFA  Value in next row Sensitive      4 SENSITIVEThis is a modified FDA-approved test that has been validated and its performance characteristics determined by the reporting laboratory.  This laboratory is certified under the Clinical Laboratory Improvement Amendments CLIA as qualified to perform high complexity clinical laboratory testing.    AMPICILLIN/SULBACTAM Value in next row Sensitive      4 SENSITIVEThis is a modified FDA-approved test that has been validated and its performance characteristics determined by the reporting laboratory.  This laboratory is certified under the Clinical Laboratory Improvement Amendments CLIA as qualified to perform high complexity clinical laboratory testing.    PIP/TAZO Value in next row Sensitive      <=4 SENSITIVEThis is a modified FDA-approved test that has been validated and its performance characteristics determined by the reporting laboratory.  This laboratory is certified under the Clinical Laboratory Improvement Amendments CLIA as qualified to  perform high complexity clinical laboratory testing.    MEROPENEM Value in next row Sensitive      <=4 SENSITIVEThis is a modified FDA-approved test that has been validated and its performance characteristics determined by the reporting laboratory.  This laboratory is certified under the Clinical Laboratory Improvement Amendments CLIA as qualified to perform high complexity clinical laboratory testing.    * >=100,000 COLONIES/mL PROTEUS MIRABILIS  Blood Culture (routine x 2)     Status: Abnormal   Collection Time: 09/29/24  2:37 PM   Specimen: BLOOD  Result Value Ref Range Status   Specimen Description   Final    BLOOD BLOOD RIGHT FOREARM Performed at [facility], [address] [place], [place] [address]    Special Requests   Final    BOTTLES DRAWN AEROBIC AND ANAEROBIC Blood Culture results may not be optimal due to an inadequate volume of blood received in culture bottles Performed at [facility], [address] [place], [place] [address]    Culture  Setup Time   Final    GRAM POSITIVE COCCI IN BOTH AEROBIC AND ANAEROBIC BOTTLES CRITICAL VALUE NOTED.  VALUE IS CONSISTENT WITH PREVIOUSLY REPORTED AND CALLED VALUE.    Culture (A)  Final    STAPHYLOCOCCUS AUREUS SUSCEPTIBILITIES PERFORMED ON PREVIOUS CULTURE WITHIN THE LAST 5 DAYS. Performed at [facility], [address] [place], [place] [address]    Report Status 10/02/2024 FINAL  Final  Blood Culture (routine x 2)     Status: Abnormal (Preliminary result)   Collection Time: 09/29/24  2:37 PM   Specimen: BLOOD  Result Value Ref Range Status   Specimen Description   Final    BLOOD LEFT ANTECUBITAL Performed at [facility], [address] [place], [place] [address]    Special Requests   Final    BOTTLES DRAWN AEROBIC AND ANAEROBIC Blood Culture results may not be optimal due to an inadequate volume of blood received in culture bottles Performed at [facility], [address] [place], [place] [address]    Culture  Setup Time   Final    GRAM POSITIVE COCCI IN BOTH AEROBIC AND ANAEROBIC BOTTLES CRITICAL RESULT CALLED TO, READ BACK BY AND VERIFIED WITH: PHARMD NATHAN B ON [id number] @0457  BY HNM Performed at [facility], [address], [place], [place] [address]    Culture METHICILLIN RESISTANT STAPHYLOCOCCUS AUREUS (A)  Final   Report Status PENDING  Incomplete   Organism ID, Bacteria METHICILLIN RESISTANT STAPHYLOCOCCUS AUREUS  Final      Susceptibility   Methicillin resistant staphylococcus aureus - MIC*    CIPROFLOXACIN  >=8 RESISTANT Resistant     ERYTHROMYCIN  >=8 RESISTANT Resistant     GENTAMICIN <=0.5 SENSITIVE Sensitive     OXACILLIN  >=4 RESISTANT Resistant     TETRACYCLINE <=1 SENSITIVE Sensitive     VANCOMYCIN  1 SENSITIVE Sensitive     TRIMETH /SULFA  <=10 SENSITIVE Sensitive     CLINDAMYCIN >=8 RESISTANT Resistant     RIFAMPIN  <=0.5 SENSITIVE Sensitive     Inducible Clindamycin NEGATIVE Sensitive     LINEZOLID  2 SENSITIVE Sensitive     * METHICILLIN RESISTANT STAPHYLOCOCCUS AUREUS  Blood Culture ID Panel (Reflexed)     Status: Abnormal   Collection Time: 09/29/24  2:37 PM  Result Value Ref Range Status   Enterococcus faecalis NOT DETECTED NOT DETECTED Final   Enterococcus Faecium NOT DETECTED NOT DETECTED Final   Listeria monocytogenes NOT DETECTED NOT DETECTED Final  Staphylococcus species DETECTED (A) NOT DETECTED Final    Comment: CRITICAL RESULT CALLED TO, READ BACK BY AND VERIFIED WITHBETHA RANKIN DILLS PHARMD [id number] 09/30/24 HNM    Staphylococcus aureus (BCID) DETECTED (A) NOT DETECTED Final    Comment: Methicillin (oxacillin)-resistant Staphylococcus aureus (MRSA). MRSA is predictably resistant to beta-lactam antibiotics (except ceftaroline). Preferred therapy is vancomycin  unless clinically contraindicated. Patient requires contact precautions if  hospitalized. CRITICAL RESULT CALLED TO, READ BACK BY AND VERIFIED WITH: RANKIN DILLS PHARMD [id number] 09/30/24 HNM    Staphylococcus epidermidis NOT DETECTED NOT DETECTED Final   Staphylococcus lugdunensis NOT DETECTED NOT DETECTED Final   Streptococcus species NOT DETECTED NOT DETECTED Final   Streptococcus agalactiae NOT DETECTED NOT DETECTED Final   Streptococcus pneumoniae NOT DETECTED NOT DETECTED Final   Streptococcus pyogenes NOT DETECTED NOT DETECTED Final   A.calcoaceticus-baumannii NOT DETECTED NOT DETECTED Final   Bacteroides fragilis NOT DETECTED NOT DETECTED Final   Enterobacterales NOT DETECTED NOT DETECTED Final   Enterobacter cloacae complex NOT DETECTED NOT DETECTED Final   Escherichia coli NOT DETECTED NOT DETECTED Final   Klebsiella aerogenes NOT  DETECTED NOT DETECTED Final   Klebsiella oxytoca NOT DETECTED NOT DETECTED Final   Klebsiella pneumoniae NOT DETECTED NOT DETECTED Final   Proteus species NOT DETECTED NOT DETECTED Final   Salmonella species NOT DETECTED NOT DETECTED Final   Serratia marcescens NOT DETECTED NOT DETECTED Final   Haemophilus influenzae NOT DETECTED NOT DETECTED Final   Neisseria meningitidis NOT DETECTED NOT DETECTED Final   Pseudomonas aeruginosa NOT DETECTED NOT DETECTED Final   Stenotrophomonas maltophilia NOT DETECTED NOT DETECTED Final   Candida albicans NOT DETECTED NOT DETECTED Final   Candida auris NOT DETECTED NOT DETECTED Final   Candida glabrata NOT DETECTED NOT DETECTED Final   Candida krusei NOT DETECTED NOT DETECTED Final   Candida parapsilosis NOT DETECTED NOT DETECTED Final   Candida tropicalis NOT DETECTED NOT DETECTED Final   Cryptococcus neoformans/gattii NOT DETECTED NOT DETECTED Final   Meth resistant mecA/C and MREJ DETECTED (A) NOT DETECTED Final    Comment: CRITICAL RESULT CALLED TO, READ BACK BY AND VERIFIED WITHBETHA RANKIN DILLS PHARMD [id number] 09/30/24 HNM Performed at [facility], [address] [place], [place] [address]   MRSA Next Gen by PCR, Nasal     Status: Abnormal   Collection Time: 09/30/24  5:00 PM   Specimen: Nasal Mucosa; Nasal Swab  Result Value Ref Range Status   MRSA by PCR Next Gen DETECTED (A) NOT DETECTED Final    Comment: RESULT CALLED TO, READ BACK BY AND VERIFIED WITH: TAMMY ORLANDO 2032 09/30/24 MU (NOTE) The GeneXpert MRSA Assay (FDA approved for NASAL specimens only), is one component of a comprehensive MRSA colonization surveillance program. It is not intended to diagnose MRSA infection nor to guide or monitor treatment for MRSA infections. Test performance is not FDA approved in patients less than 16 years old. Performed at [facility], [name] [place], [place] [address]   Cath Tip Culture     Status: Abnormal (Preliminary  result)   Collection Time: 10/01/24 11:03 AM   Specimen: Catheter Tip; Other  Result Value Ref Range Status   Specimen Description   Final    CATH TIP Performed at [facility], [address] [place], [place] [address]    Special Requests   Final    NONE Performed at [facility], [address] [place], [place] [address]    Culture (A)  Final  STAPHYLOCOCCUS AUREUS SUSCEPTIBILITIES TO FOLLOW Performed at [facility], [address], [place], [place] [address]    Report Status PENDING  Incomplete    Coagulation Studies: Recent Labs    09/29/24 1437  LABPROT 14.4  INR 1.1    Urinalysis: Recent Labs    09/29/24 1417  COLORURINE AMBER*  LABSPEC 1.024  PHURINE 8.0  GLUCOSEU >=500*  HGBUR NEGATIVE  BILIRUBINUR NEGATIVE  KETONESUR NEGATIVE  PROTEINUR >=300*  NITRITE NEGATIVE  LEUKOCYTESUR MODERATE*      Imaging: PERIPHERAL VASCULAR CATHETERIZATION Result Date: 10/01/2024 See surgical note for result.    Medications:    promethazine  (PHENERGAN ) injection (IM or IVPB) 6.25 mg (10/02/24 0519)   vancomycin       bisacodyl   10 mg Oral QHS   carvedilol   6.25 mg Oral BID WC   Chlorhexidine  Gluconate Cloth  6 each Topical Q0600   cyanocobalamin   1,000 mcg Oral Daily   diltiazem   240 mg Oral QPM   doxepin   10 mg Oral QHS   famotidine   10 mg Oral Daily   folic acid   1 mg Oral Daily   gabapentin   300 mg Oral QHS   heparin   5,000 Units Subcutaneous Q8H   insulin  aspart  0-5 Units Subcutaneous QHS   insulin  aspart  0-6 Units Subcutaneous TID WC   insulin  aspart  4 Units Subcutaneous TID WC   insulin  glargine  15 Units Subcutaneous QHS   lactulose   20 g Oral BID   levothyroxine   175 mcg Oral QHS   linaclotide   145 mcg Oral QAC breakfast   loratadine   10 mg Oral Daily   metoCLOPramide   5 mg Oral BID AC   polyethylene glycol  17 g Oral BID   senna-docusate  1 tablet Oral BID   simethicone   80 mg Oral QID   vancomycin  variable dose  per unstable renal function (pharmacist dosing)   Does not apply See admin instructions   acetaminophen  **OR** acetaminophen , alteplase , bisacodyl , guaiFENesin , heparin , ipratropium-albuterol , liver oil-zinc  oxide, ondansetron  **OR** ondansetron  (ZOFRAN ) IV, oxyCODONE , promethazine  (PHENERGAN ) injection (IM or IVPB), sodium phosphate   Assessment/ Plan:  Eugene [name] is a 64 y.o.  male with past medical conditions including diabetes, hepatitis C, bilateral BKA, anxiety and depression, and end-stage renal disease on hemodialysis.  Patient presents to the emergency department from his facility complaining of shortness of breath.  Patient is currently admitted for SIRS (systemic inflammatory response syndrome) (HCC) [R65.10]  [facility]/MWF/right IJ PermCath/120.5 kg  End-stage renal disease on hemodialysis.  Last treatment received on 1/7, 30 min.  Due to infection, appreciate vascular removing right IJ PermCath on 1/8.  Will maintain line free holiday for at least 2 days or if patient tolerates.  May require HD temp cath over the weekend to provide dialysis. Maintain on renal diet to limit potassium and fluid intake.   Anemia of chronic kidney disease Lab Results  Component Value Date   HGB 10.4 (L) 10/01/2024   Hemoglobin stable for this patient  3.  Systemic inflammatory response syndrome, suspected due to fever, leukocytosis, and tachypnea on ED arrival.  Suspected source of suprapubic catheter. Abdominal x-ray shows moderate diffuse air distention of the bowel suspicious for ileus.  Blood culture positive for MRSA. Vascular removed PermCath on 1/8 and patient experiencing line holiday.  Continues to receive vancomycin  ordered per primary team.  Cefepime  stopped  4. Secondary Hyperparathyroidism: with outpatient labs: None available  Lab Results  Component Value Date   PTH  177 (H) 04/23/2024   CALCIUM  7.6 (L) 10/02/2024   PHOS 4.1 08/05/2024    Will continue to monitor  bone minerals.  Awaiting updated phosphorus level.   LOS: 3 Eugene [name] 1/9/202612:17 PM   "

## 2024-10-02 NOTE — TOC Progression Note (Signed)
 Transition of Care [facility]) - Progression Note    Patient Details  Name: Tamarick Kovalcik MRN: [id number] Date of Birth: [birth year]  [facility]) CM/SW Contact  Dalia GORMAN Fuse, RN Phone Number: 10/02/2024, 9:45 AM  Clinical Narrative:     Patient remains inpt appropriate. MRSA positive, continuing IV vancomycin . On 6 L [place] at baseline, weaning as tolerated. Being provided comfort for abd pain ( potential Proctitis)   No TOC needs at this time. Plan to discharge back to previous home environment when medically appropriate.                  Expected Discharge Plan and Services                                               Social Drivers of Health (SDOH) Interventions SDOH Screenings   Food Insecurity: No Food Insecurity (09/29/2024)  Housing: Low Risk (09/29/2024)  Transportation Needs: No Transportation Needs (09/29/2024)  Utilities: Not At Risk (09/29/2024)  Social Connections: Socially Isolated (09/29/2024)  Tobacco Use: Medium Risk (09/29/2024)    Readmission Risk Interventions    05/05/2024    3:05 PM  Readmission Risk Prevention Plan  Transportation Screening Complete  Medication Review (RN Care Manager) Complete  PCP or Specialist appointment within 3-5 days of discharge Complete  SW Recovery Care/Counseling Consult Complete  Palliative Care Screening Not Applicable  Skilled Nursing Facility Complete

## 2024-10-02 NOTE — Progress Notes (Signed)
*  PRELIMINARY RESULTS* Echocardiogram 2D Echocardiogram has been performed.  Eugene [name] 10/02/2024, 11:39 AM

## 2024-10-02 NOTE — Plan of Care (Signed)

## 2024-10-02 NOTE — Consult Note (Signed)
 Pharmacy Antibiotic Note  Eugene [name] is a 64 y.o. male with a PMH of ESRD on HD MWF, HTN, DM, HLD and anxiety is admitted on 09/29/2024 with sepsis  and SOB. Pharmacy has been consulted for vancomycin   dosing.  Today, 10/02/2024 Day #3 vancomycin  Renal: ESRD on HD - MWF HD catheter removed 1/8 for line holiday WBC 14.8 Tmax 100.4 1/6 blood cx: 4/4 GPC, BCID MRSA 1/9 repeat blood cx: pending 1/8 cath tip. S. aureus Urine cx: >100K P mirabilis, MRSA  Vancomycin  dosing and levels:  Vancomycin  2gm IV x 1 1/6 at 2236. Per HD documentation received about 2h of HD on 1/7.  Random vancomycin  level 1/9 = 18 mcg/mL  Plan: Based on random vancomycin  level today,  give vancomycin  1gm IV x 1.  Anticipate this will provide adequate levels until resume HD after line holiday.  Per documented urine output he does have residual renal function so will recheck level Monday morning. Await repeat blood cx and plan to replace HD catheter and resume HD (once documented clearance of blood cx) Planning for TEE  Height: 5' 10 (177.8 cm) Weight: 123.6 kg (272 lb 7.8 oz) IBW/kg (Calculated) : 73  Temp (24hrs), Avg:99 F (37.2 C), Min:98.5 F (36.9 C), Max:99.3 F (37.4 C)  Recent Labs  Lab 09/29/24 1437 09/29/24 2006 09/30/24 0425 09/30/24 0727 10/01/24 1330 10/02/24 0627  WBC 26.4*  --  16.8*  --  10.8*  --   CREATININE 4.37*  --   --  4.81* 4.47* 4.73*  LATICACIDVEN 1.7 1.5  --   --   --   --   VANCORANDOM  --   --   --   --   --  18    Estimated Creatinine Clearance: 21.6 mL/min (A) (by C-G formula based on SCr of 4.73 mg/dL (H)).    Allergies[1]  Antimicrobials this admission: 1/6 cefepime  >> 1/6 1/6 vancomycin  >>   Thank you for allowing pharmacy to be a part of this patients care.  Mordche Hedglin, PharmD, BCPS, BCIDP Work Cell: [phone] 10/02/2024 9:50 AM         [1] No Known Allergies

## 2024-10-02 NOTE — Progress Notes (Signed)
 " Progress Note   Patient: Eugene [name] FMW:[id number] DOB: 12-12-1960 DOA: 09/29/2024     3 DOS: the patient was seen and examined on 10/02/2024   Brief hospital course: HPI: Byran Bilotti is a 64 y.o. male with medical history significant of anxiety, depression, type 2 diabetes mellitus, history of hepatitis C, end-stage renal disease on dialysis, bilateral BKA.  Patient known to most historian.  States he is short of breath.  He is sick to his stomach.  He vomited.  No diarrhea.  He is short of breath.  He is having a fever.  In the ER he was found to have an elevated white blood cell count and started on sepsis protocol.  Respiratory panel pending, chest x-ray shows mild interstitial edema and left greater than right pleural effusion.  Urine looks positive and patient has a suprapubic catheter.  Hospitalist services contacted for further evaluation.   Assessment and Plan: *Sepsis secondary to MRSA bacteremia Present on admission.  Sepsis criteria fever leukocytosis and tachycardia.  Now resolved.  MRSA positive in 4 out of 4 bottles. HD catheter removed 1/8 Continue vancomycin  -- Follow repeat blood cultures  Normocytic anemia Thrombocytopenia Likely in settong of anemia of chronic disease and infection.  CTM for now.   Abdominal discomfort N/V  Unclear etiology.  Continues to have significant nausea with no episodes of emesis.  CT of the abdomen pelvis with contrast notable for proctocolitis but no findings to suggest etiology of his nausea.   --Continue symptomatic support  --GI pathogen panel if able to collect -- Antiemetics remain ineffective, will consider NG tube.  ESRD (end stage renal disease) (HCC) Hemodialysis per nephrology  Paroxysmal SVT (supraventricular tachycardia) On Cardizem  and Coreg   Heart failure with preserved ejection fraction (HCC) Dialysis to manage fluid  Chronic respiratory failure with hypoxia (HCC) On 6 L wean as able.   HTN Hypertensive,  likely acutely worsened due to patient being uncomfortable.  -Hold midodrine .   Chronic bilateral pleural effusions Left greater than right.  Hd.   Chronic pain On oxycodone   Uncontrolled type 2 diabetes mellitus with hyperglycemia, with long-term current use of insulin  (HCC) A1c 7.3 though ESRD. Continue Lantus  and sliding scale, increase Lantus  to 15 units, start mealtime insulin  at 4 units 3 times daily  Chronic constipation Patient on numerous medications for constipation. Has not had a bowel movement.  Will increase lactulose .  Obesity (BMI 30-39.9) BMI 37.64  Hypothyroidism On levothyroxine        Subjective: Continues to have significant amount of nausea.  No bowel movement.  Physical Exam: Vitals:   10/02/24 0405 10/02/24 0731 10/02/24 0755 10/02/24 1139  BP: 139/75  (!) 147/68 (!) 153/70  Pulse: 78  79 75  Resp: 16  16   Temp: 98.5 F (36.9 C)  99.1 F (37.3 C) 99.3 F (37.4 C)  TempSrc: Oral  Oral   SpO2: 94% 91% 94% 93%  Weight:      Height:         Constitutional: In no distress.  Cardiovascular: Normal rate, regular rhythm. No lower extremity edema  Pulmonary: Non labored breathing on [place], no wheezing or rales.   Abdominal: Soft. Non distended and non tender Musculoskeletal: s/p Bilateral BKA    Neurological: Alert and oriented to person, place, and time. Non focal  Skin: Skin is warm and dry.   Data Reviewed:     Latest Ref Rng & Units 10/02/2024    6:27 AM 10/01/2024    1:30 PM 09/30/2024  7:27 AM  BMP  Glucose 70 - 99 mg/dL 796  738  601   BUN 8 - 23 mg/dL 66  65  70   Creatinine 0.61 - 1.24 mg/dL 5.26  5.52  5.18   Sodium 135 - 145 mmol/L 140  138  136   Potassium 3.5 - 5.1 mmol/L 4.0  4.3  5.1   Chloride 98 - 111 mmol/L 104  102  101   CO2 22 - 32 mmol/L 22  23  22    Calcium  8.9 - 10.3 mg/dL 7.6  8.1  8.2       Latest Ref Rng & Units 10/01/2024    1:30 PM 09/30/2024    4:25 AM 09/29/2024    2:37 PM  CBC  WBC 4.0 - 10.5 K/uL 10.8  16.8   26.4   Hemoglobin 13.0 - 17.0 g/dL 89.5  9.9  88.4   Hematocrit 39.0 - 52.0 % 32.3  32.7  36.1   Platelets 150 - 400 K/uL 93  118  139      Family Communication: None at bedside.   Disposition: Status is: Inpatient Remains inpatient appropriate because: MRSA bacteremia, IV antibiotics  Planned Discharge Destination: Pending clinical course     Time spent: 35 minutes  Author: Alban Pepper, MD 10/02/2024 3:32 PM  For on call review [email or web address].  "

## 2024-10-03 ENCOUNTER — Inpatient Hospital Stay

## 2024-10-03 DIAGNOSIS — R7881 Bacteremia: Secondary | ICD-10-CM | POA: Diagnosis not present

## 2024-10-03 DIAGNOSIS — B9562 Methicillin resistant Staphylococcus aureus infection as the cause of diseases classified elsewhere: Secondary | ICD-10-CM | POA: Diagnosis not present

## 2024-10-03 LAB — CBC
HCT: 31.4 % — ABNORMAL LOW (ref 39.0–52.0)
Hemoglobin: 10 g/dL — ABNORMAL LOW (ref 13.0–17.0)
MCH: 29.9 pg (ref 26.0–34.0)
MCHC: 31.8 g/dL (ref 30.0–36.0)
MCV: 94 fL (ref 80.0–100.0)
Platelets: 85 K/uL — ABNORMAL LOW (ref 150–400)
RBC: 3.34 MIL/uL — ABNORMAL LOW (ref 4.22–5.81)
RDW: 14.3 % (ref 11.5–15.5)
WBC: 8.5 K/uL (ref 4.0–10.5)
nRBC: 0 % (ref 0.0–0.2)

## 2024-10-03 LAB — CATH TIP CULTURE

## 2024-10-03 LAB — BASIC METABOLIC PANEL WITH GFR
Anion gap: 10 (ref 5–15)
BUN: 72 mg/dL — ABNORMAL HIGH (ref 8–23)
CO2: 24 mmol/L (ref 22–32)
Calcium: 7.6 mg/dL — ABNORMAL LOW (ref 8.9–10.3)
Chloride: 104 mmol/L (ref 98–111)
Creatinine, Ser: 5.15 mg/dL — ABNORMAL HIGH (ref 0.61–1.24)
GFR, Estimated: 12 mL/min — ABNORMAL LOW
Glucose, Bld: 293 mg/dL — ABNORMAL HIGH (ref 70–99)
Potassium: 4.3 mmol/L (ref 3.5–5.1)
Sodium: 138 mmol/L (ref 135–145)

## 2024-10-03 LAB — GLUCOSE, CAPILLARY
Glucose-Capillary: 274 mg/dL — ABNORMAL HIGH (ref 70–99)
Glucose-Capillary: 277 mg/dL — ABNORMAL HIGH (ref 70–99)
Glucose-Capillary: 270 mg/dL — ABNORMAL HIGH (ref 70–99)
Glucose-Capillary: 316 mg/dL — ABNORMAL HIGH (ref 70–99)

## 2024-10-03 MED ORDER — METOCLOPRAMIDE HCL 10 MG/10ML PO SOLN
5.0000 mg | Freq: Three times a day (TID) | ORAL | Status: DC
  Administered 2024-10-03: 5 mg via ORAL
  Filled 2024-10-03 (×2): qty 5

## 2024-10-03 MED ORDER — NICOTINE POLACRILEX 2 MG MT GUM
2.0000 mg | CHEWING_GUM | OROMUCOSAL | Status: DC | PRN
  Administered 2024-10-04 – 2024-10-09 (×7): 2 mg via ORAL
  Filled 2024-10-03 (×10): qty 1

## 2024-10-03 MED ORDER — INSULIN ASPART 100 UNIT/ML IJ SOLN
5.0000 [IU] | Freq: Three times a day (TID) | INTRAMUSCULAR | Status: DC
  Administered 2024-10-03 – 2024-10-04 (×4): 5 [IU] via SUBCUTANEOUS
  Filled 2024-10-03 (×4): qty 5

## 2024-10-03 MED ORDER — METOCLOPRAMIDE HCL 10 MG/10ML PO SOLN
5.0000 mg | Freq: Three times a day (TID) | ORAL | Status: DC
  Filled 2024-10-03: qty 5

## 2024-10-03 MED ORDER — INSULIN GLARGINE 100 UNIT/ML ~~LOC~~ SOLN
18.0000 [IU] | Freq: Every day | SUBCUTANEOUS | Status: DC
  Administered 2024-10-03: 18 [IU] via SUBCUTANEOUS
  Filled 2024-10-03 (×2): qty 0.18

## 2024-10-03 MED ORDER — NICOTINE 14 MG/24HR TD PT24
14.0000 mg | MEDICATED_PATCH | Freq: Every day | TRANSDERMAL | Status: DC
  Administered 2024-10-03 – 2024-10-15 (×13): 14 mg via TRANSDERMAL
  Filled 2024-10-03 (×13): qty 1

## 2024-10-03 MED ORDER — METOCLOPRAMIDE HCL 10 MG/10ML PO SOLN
5.0000 mg | Freq: Three times a day (TID) | ORAL | Status: DC
  Administered 2024-10-03 – 2024-10-05 (×4): 5 mg via ORAL
  Filled 2024-10-03 (×3): qty 10
  Filled 2024-10-03 (×3): qty 5

## 2024-10-03 NOTE — Progress Notes (Signed)
 " [facility]  ROUNDING NOTE   Subjective:   Eugene [name] is a 64 year old male with past medical conditions including diabetes, hepatitis C, bilateral BKA, anxiety and depression, and end-stage renal disease on hemodialysis.  Patient presents to the emergency department from his facility complaining of shortness of breath.  Patient is currently admitted for SIRS (systemic inflammatory response syndrome) (HCC) [R65.10]  Patient is known to our practice and receives outpatient dialysis treatments at [facility]  on a MWF schedule, supervised by Dr. Dennise.    Update: Laying in bed, noted increased oxygen at 10 liter. Counseled patient on need for dialysis since his oxygen requirement increased overnight. Patient refused to place temp line today for dialysis treatment. Encouraged to limit fluid intake.   Objective:  Vital signs in last 24 hours:  Temp:  [98.6 F (37 C)-101.1 F (38.4 C)] 98.8 F (37.1 C) (01/10 0950) Pulse Rate:  [75-88] 88 (01/10 0743) Resp:  [16-18] 18 (01/10 0743) BP: (120-149)/(63-75) 145/70 (01/10 0743) SpO2:  [90 %-94 %] 93 % (01/10 1351)  Weight change:  Filed Weights   09/29/24 1412 09/30/24 1043  Weight: 119 kg 123.6 kg    Intake/Output: I/O last 3 completed shifts: In: 592.5 [P.O.:240; IV Piggyback:352.5] Out: 650 [Urine:650]   Intake/Output this shift:  No intake/output data recorded.  Physical Exam: General: ill-appearing  Head: Normocephalic  Eyes: Anicteric  Lungs:  Coarse/wheeze, [place] 10 Liters  Heart: Regular rate and rhythm  Abdomen:  Soft, nontender  Extremities: Trace edema.  Bilateral BKA  Neurologic: Awake, alert, conversant  Skin: Warm,dry, no rash  Access: None-line holiday    Basic Metabolic Panel: Recent Labs  Lab 09/29/24 1437 09/30/24 0727 10/01/24 1330 10/02/24 0627 10/03/24 0628  NA 139 136 138 140 138  K 4.3 5.1 4.3 4.0 4.3  CL 102 101 102 104 104  CO2 24 22 23 22 24   GLUCOSE 256* 398* 261* 203*  293*  BUN 60* 70* 65* 66* 72*  CREATININE 4.37* 4.81* 4.47* 4.73* 5.15*  CALCIUM  8.3* 8.2* 8.1* 7.6* 7.6*  PHOS  --   --   --  5.0*  --     Liver Function Tests: Recent Labs  Lab 09/29/24 1437  AST 21  ALT 28  ALKPHOS 83  BILITOT 0.4  PROT 6.5  ALBUMIN  3.2*   Recent Labs  Lab 09/30/24 0727  LIPASE <10*   No results for input(s): AMMONIA in the last 168 hours.  CBC: Recent Labs  Lab 09/29/24 1437 09/30/24 0425 10/01/24 1330 10/03/24 0628  WBC 26.4* 16.8* 10.8* 8.5  NEUTROABS 23.2*  --  9.3*  --   HGB 11.5* 9.9* 10.4* 10.0*  HCT 36.1* 32.7* 32.3* 31.4*  MCV 94.0 97.0 93.1 94.0  PLT 139* 118* 93* 85*    Cardiac Enzymes: No results for input(s): CKTOTAL, CKMB, CKMBINDEX, TROPONINI in the last 168 hours.  BNP: Invalid input(s): POCBNP  CBG: Recent Labs  Lab 10/02/24 1141 10/02/24 1652 10/02/24 2020 10/03/24 0813 10/03/24 1228  GLUCAP 178* 232* 221* 274* 277*    Microbiology: Results for orders placed or performed during the hospital encounter of 09/29/24  Resp panel by RT-PCR (RSV, Flu A&B, Covid) Anterior Nasal Swab     Status: None   Collection Time: 09/29/24  2:16 PM   Specimen: Anterior Nasal Swab  Result Value Ref Range Status   SARS Coronavirus 2 by RT PCR NEGATIVE NEGATIVE Final    Comment: (NOTE) SARS-CoV-2 target nucleic acids are NOT DETECTED.  The  SARS-CoV-2 RNA is generally detectable in upper respiratory specimens during the acute phase of infection. The lowest concentration of SARS-CoV-2 viral copies this assay can detect is 138 copies/mL. A negative result does not preclude SARS-Cov-2 infection and should not be used as the sole basis for treatment or other patient management decisions. A negative result may occur with  improper specimen collection/handling, submission of specimen other than nasopharyngeal swab, presence of viral mutation(s) within the areas targeted by this assay, and inadequate number of  viral copies(<138 copies/mL). A negative result must be combined with clinical observations, patient history, and epidemiological information. The expected result is Negative.  Fact Sheet for Patients:  bloggercourse.com  Fact Sheet for Healthcare Providers:  seriousbroker.it  This test is no t yet approved or cleared by the [place]  FDA and  has been authorized for detection and/or diagnosis of SARS-CoV-2 by FDA under an Emergency Use Authorization (EUA). This EUA will remain  in effect (meaning this test can be used) for the duration of the COVID-19 declaration under Section 564(b)(1) of the Act, 21 U.S.C.section 360bbb-3(b)(1), unless the authorization is terminated  or revoked sooner.       Influenza A by PCR NEGATIVE NEGATIVE Final   Influenza B by PCR NEGATIVE NEGATIVE Final    Comment: (NOTE) The Xpert Xpress SARS-CoV-2/FLU/RSV plus assay is intended as an aid in the diagnosis of influenza from Nasopharyngeal swab specimens and should not be used as a sole basis for treatment. Nasal washings and aspirates are unacceptable for Xpert Xpress SARS-CoV-2/FLU/RSV testing.  Fact Sheet for Patients: bloggercourse.com  Fact Sheet for Healthcare Providers: seriousbroker.it  This test is not yet approved or cleared by the [place]  FDA and has been authorized for detection and/or diagnosis of SARS-CoV-2 by FDA under an Emergency Use Authorization (EUA). This EUA will remain in effect (meaning this test can be used) for the duration of the COVID-19 declaration under Section 564(b)(1) of the Act, 21 U.S.C. section 360bbb-3(b)(1), unless the authorization is terminated or revoked.     Resp Syncytial Virus by PCR NEGATIVE NEGATIVE Final    Comment: (NOTE) Fact Sheet for Patients: bloggercourse.com  Fact Sheet for Healthcare  Providers: seriousbroker.it  This test is not yet approved or cleared by the [place]  FDA and has been authorized for detection and/or diagnosis of SARS-CoV-2 by FDA under an Emergency Use Authorization (EUA). This EUA will remain in effect (meaning this test can be used) for the duration of the COVID-19 declaration under Section 564(b)(1) of the Act, 21 U.S.C. section 360bbb-3(b)(1), unless the authorization is terminated or revoked.  Performed at [facility], [address], [place], [place] [address]   Urine Culture     Status: Abnormal   Collection Time: 09/29/24  2:17 PM   Specimen: Urine, Random  Result Value Ref Range Status   Specimen Description   Final    URINE, RANDOM Performed at [facility], [address] [place], [place] [address]    Special Requests   Final    NONE Reflexed from [id number] Performed at [facility], [address] [place], [place] [address]    Culture (A)  Final    >=100,000 COLONIES/mL PROTEUS MIRABILIS >=100,000 COLONIES/mL METHICILLIN RESISTANT STAPHYLOCOCCUS AUREUS    Report Status 10/02/2024 FINAL  Final   Organism ID, Bacteria PROTEUS MIRABILIS (A)  Final   Organism ID, Bacteria METHICILLIN RESISTANT STAPHYLOCOCCUS AUREUS (A)  Final      Susceptibility   Methicillin resistant staphylococcus aureus -  MIC*    CIPROFLOXACIN  >=8 RESISTANT Resistant     GENTAMICIN <=0.5 SENSITIVE Sensitive     NITROFURANTOIN <=16 SENSITIVE Sensitive     OXACILLIN >=4 RESISTANT Resistant     TETRACYCLINE <=1 SENSITIVE Sensitive     VANCOMYCIN  1 SENSITIVE Sensitive     TRIMETH /SULFA  <=10 SENSITIVE Sensitive     RIFAMPIN  <=0.5 SENSITIVE Sensitive     Inducible Clindamycin NEGATIVE Sensitive     LINEZOLID  2 SENSITIVE Sensitive     * >=100,000 COLONIES/mL METHICILLIN RESISTANT STAPHYLOCOCCUS AUREUS   Proteus mirabilis - MIC*    AMPICILLIN <=2 SENSITIVE Sensitive     CEFAZOLIN  (URINE) Value in next  row Sensitive      4 SENSITIVEThis is a modified FDA-approved test that has been validated and its performance characteristics determined by the reporting laboratory.  This laboratory is certified under the Clinical Laboratory Improvement Amendments CLIA as qualified to perform high complexity clinical laboratory testing.    CEFEPIME  Value in next row Sensitive      4 SENSITIVEThis is a modified FDA-approved test that has been validated and its performance characteristics determined by the reporting laboratory.  This laboratory is certified under the Clinical Laboratory Improvement Amendments CLIA as qualified to perform high complexity clinical laboratory testing.    ERTAPENEM Value in next row Sensitive      4 SENSITIVEThis is a modified FDA-approved test that has been validated and its performance characteristics determined by the reporting laboratory.  This laboratory is certified under the Clinical Laboratory Improvement Amendments CLIA as qualified to perform high complexity clinical laboratory testing.    CEFTRIAXONE  Value in next row Sensitive      4 SENSITIVEThis is a modified FDA-approved test that has been validated and its performance characteristics determined by the reporting laboratory.  This laboratory is certified under the Clinical Laboratory Improvement Amendments CLIA as qualified to perform high complexity clinical laboratory testing.    CIPROFLOXACIN  Value in next row Resistant      4 SENSITIVEThis is a modified FDA-approved test that has been validated and its performance characteristics determined by the reporting laboratory.  This laboratory is certified under the Clinical Laboratory Improvement Amendments CLIA as qualified to perform high complexity clinical laboratory testing.    GENTAMICIN Value in next row Sensitive      4 SENSITIVEThis is a modified FDA-approved test that has been validated and its performance characteristics determined by the reporting laboratory.  This  laboratory is certified under the Clinical Laboratory Improvement Amendments CLIA as qualified to perform high complexity clinical laboratory testing.    NITROFURANTOIN Value in next row Resistant      4 SENSITIVEThis is a modified FDA-approved test that has been validated and its performance characteristics determined by the reporting laboratory.  This laboratory is certified under the Clinical Laboratory Improvement Amendments CLIA as qualified to perform high complexity clinical laboratory testing.    TRIMETH /SULFA  Value in next row Sensitive      4 SENSITIVEThis is a modified FDA-approved test that has been validated and its performance characteristics determined by the reporting laboratory.  This laboratory is certified under the Clinical Laboratory Improvement Amendments CLIA as qualified to perform high complexity clinical laboratory testing.    AMPICILLIN/SULBACTAM Value in next row Sensitive      4 SENSITIVEThis is a modified FDA-approved test that has been validated and its performance characteristics determined by the reporting laboratory.  This laboratory is certified under the Clinical Laboratory Improvement Amendments CLIA as qualified to perform high complexity  clinical laboratory testing.    PIP/TAZO Value in next row Sensitive      <=4 SENSITIVEThis is a modified FDA-approved test that has been validated and its performance characteristics determined by the reporting laboratory.  This laboratory is certified under the Clinical Laboratory Improvement Amendments CLIA as qualified to perform high complexity clinical laboratory testing.    MEROPENEM Value in next row Sensitive      <=4 SENSITIVEThis is a modified FDA-approved test that has been validated and its performance characteristics determined by the reporting laboratory.  This laboratory is certified under the Clinical Laboratory Improvement Amendments CLIA as qualified to perform high complexity clinical laboratory testing.    *  >=100,000 COLONIES/mL PROTEUS MIRABILIS  Blood Culture (routine x 2)     Status: Abnormal   Collection Time: 09/29/24  2:37 PM   Specimen: BLOOD  Result Value Ref Range Status   Specimen Description   Final    BLOOD BLOOD RIGHT FOREARM Performed at [facility], [address], [place], [place] [address]    Special Requests   Final    BOTTLES DRAWN AEROBIC AND ANAEROBIC Blood Culture results may not be optimal due to an inadequate volume of blood received in culture bottles Performed at [facility], [address], [place], [place] [address]    Culture  Setup Time   Final    GRAM POSITIVE COCCI IN BOTH AEROBIC AND ANAEROBIC BOTTLES CRITICAL VALUE NOTED.  VALUE IS CONSISTENT WITH PREVIOUSLY REPORTED AND CALLED VALUE.    Culture (A)  Final    STAPHYLOCOCCUS AUREUS SUSCEPTIBILITIES PERFORMED ON PREVIOUS CULTURE WITHIN THE LAST 5 DAYS. Performed at [facility], [address] [place], [place] [address]    Report Status 10/02/2024 FINAL  Final  Blood Culture (routine x 2)     Status: Abnormal (Preliminary result)   Collection Time: 09/29/24  2:37 PM   Specimen: BLOOD  Result Value Ref Range Status   Specimen Description   Final    BLOOD LEFT ANTECUBITAL Performed at [facility], [address] [place], [place] [address]    Special Requests   Final    BOTTLES DRAWN AEROBIC AND ANAEROBIC Blood Culture results may not be optimal due to an inadequate volume of blood received in culture bottles Performed at [facility], [address] [place], [place] [address]    Culture  Setup Time   Final    GRAM POSITIVE COCCI IN BOTH AEROBIC AND ANAEROBIC BOTTLES CRITICAL RESULT CALLED TO, READ BACK BY AND VERIFIED WITH: PHARMD NATHAN B ON [id number] @0457  BY HNM Performed at [facility], [address] [place], [place] [address]    Culture METHICILLIN RESISTANT STAPHYLOCOCCUS AUREUS (A)  Final   Report Status PENDING  Incomplete    Organism ID, Bacteria METHICILLIN RESISTANT STAPHYLOCOCCUS AUREUS  Final      Susceptibility   Methicillin resistant staphylococcus aureus - MIC*    CIPROFLOXACIN  >=8 RESISTANT Resistant     ERYTHROMYCIN  >=8 RESISTANT Resistant     GENTAMICIN <=0.5 SENSITIVE Sensitive     OXACILLIN >=4 RESISTANT Resistant     TETRACYCLINE <=1 SENSITIVE Sensitive     VANCOMYCIN  1 SENSITIVE Sensitive     TRIMETH /SULFA  <=10 SENSITIVE Sensitive     CLINDAMYCIN >=8 RESISTANT Resistant     RIFAMPIN  <=0.5 SENSITIVE Sensitive     Inducible Clindamycin NEGATIVE Sensitive     LINEZOLID  2 SENSITIVE Sensitive     * METHICILLIN RESISTANT STAPHYLOCOCCUS AUREUS  Blood Culture ID Panel (Reflexed)     Status: Abnormal   Collection Time: 09/29/24  2:37 PM  Result Value Ref Range Status   Enterococcus faecalis NOT DETECTED NOT DETECTED Final   Enterococcus Faecium NOT DETECTED NOT DETECTED Final   Listeria monocytogenes NOT DETECTED NOT DETECTED Final   Staphylococcus species DETECTED (A) NOT DETECTED Final    Comment: CRITICAL RESULT CALLED TO, READ BACK BY AND VERIFIED WITHBETHA RANKIN DILLS PHARMD [id number] 09/30/24 HNM    Staphylococcus aureus (BCID) DETECTED (A) NOT DETECTED Final    Comment: Methicillin (oxacillin)-resistant Staphylococcus aureus (MRSA). MRSA is predictably resistant to beta-lactam antibiotics (except ceftaroline). Preferred therapy is vancomycin  unless clinically contraindicated. Patient requires contact precautions if  hospitalized. CRITICAL RESULT CALLED TO, READ BACK BY AND VERIFIED WITH: RANKIN DILLS PHARMD [id number] 09/30/24 HNM    Staphylococcus epidermidis NOT DETECTED NOT DETECTED Final   Staphylococcus lugdunensis NOT DETECTED NOT DETECTED Final   Streptococcus species NOT DETECTED NOT DETECTED Final   Streptococcus agalactiae NOT DETECTED NOT DETECTED Final   Streptococcus pneumoniae NOT DETECTED NOT DETECTED Final   Streptococcus pyogenes NOT DETECTED NOT DETECTED Final   A.calcoaceticus-baumannii  NOT DETECTED NOT DETECTED Final   Bacteroides fragilis NOT DETECTED NOT DETECTED Final   Enterobacterales NOT DETECTED NOT DETECTED Final   Enterobacter cloacae complex NOT DETECTED NOT DETECTED Final   Escherichia coli NOT DETECTED NOT DETECTED Final   Klebsiella aerogenes NOT DETECTED NOT DETECTED Final   Klebsiella oxytoca NOT DETECTED NOT DETECTED Final   Klebsiella pneumoniae NOT DETECTED NOT DETECTED Final   Proteus species NOT DETECTED NOT DETECTED Final   Salmonella species NOT DETECTED NOT DETECTED Final   Serratia marcescens NOT DETECTED NOT DETECTED Final   Haemophilus influenzae NOT DETECTED NOT DETECTED Final   Neisseria meningitidis NOT DETECTED NOT DETECTED Final   Pseudomonas aeruginosa NOT DETECTED NOT DETECTED Final   Stenotrophomonas maltophilia NOT DETECTED NOT DETECTED Final   Candida albicans NOT DETECTED NOT DETECTED Final   Candida auris NOT DETECTED NOT DETECTED Final   Candida glabrata NOT DETECTED NOT DETECTED Final   Candida krusei NOT DETECTED NOT DETECTED Final   Candida parapsilosis NOT DETECTED NOT DETECTED Final   Candida tropicalis NOT DETECTED NOT DETECTED Final   Cryptococcus neoformans/gattii NOT DETECTED NOT DETECTED Final   Meth resistant mecA/C and MREJ DETECTED (A) NOT DETECTED Final    Comment: CRITICAL RESULT CALLED TO, READ BACK BY AND VERIFIED WITHBETHA RANKIN DILLS PHARMD [id number] 09/30/24 HNM Performed at [facility], [address] [place], [place] [address]   MRSA Next Gen by PCR, Nasal     Status: Abnormal   Collection Time: 09/30/24  5:00 PM   Specimen: Nasal Mucosa; Nasal Swab  Result Value Ref Range Status   MRSA by PCR Next Gen DETECTED (A) NOT DETECTED Final    Comment: RESULT CALLED TO, READ BACK BY AND VERIFIED WITH: TAMMY ORLANDO 2032 09/30/24 MU (NOTE) The GeneXpert MRSA Assay (FDA approved for NASAL specimens only), is one component of a comprehensive MRSA colonization surveillance program. It is not intended to diagnose  MRSA infection nor to guide or monitor treatment for MRSA infections. Test performance is not FDA approved in patients less than 39 years old. Performed at [facility], [address], [place], [place] [address]   Cath Tip Culture     Status: Abnormal   Collection Time: 10/01/24 11:03 AM   Specimen: Catheter Tip; Other  Result Value Ref Range Status   Specimen Description  Final    CATH TIP Performed at [facility], [address] [place], [place] [address]    Special Requests   Final    NONE Performed at [facility], [address] [place], [place] [address]    Culture METHICILLIN RESISTANT STAPHYLOCOCCUS AUREUS (A)  Final   Report Status 10/03/2024 FINAL  Final   Organism ID, Bacteria METHICILLIN RESISTANT STAPHYLOCOCCUS AUREUS  Final      Susceptibility   Methicillin resistant staphylococcus aureus - MIC*    CIPROFLOXACIN  >=8 RESISTANT Resistant     ERYTHROMYCIN  >=8 RESISTANT Resistant     GENTAMICIN <=0.5 SENSITIVE Sensitive     OXACILLIN >=4 RESISTANT Resistant     TETRACYCLINE <=1 SENSITIVE Sensitive     VANCOMYCIN  <=0.5 SENSITIVE Sensitive     TRIMETH /SULFA  <=10 SENSITIVE Sensitive     CLINDAMYCIN >=8 RESISTANT Resistant     RIFAMPIN  <=0.5 SENSITIVE Sensitive     Inducible Clindamycin NEGATIVE Sensitive     LINEZOLID  2 SENSITIVE Sensitive     * METHICILLIN RESISTANT STAPHYLOCOCCUS AUREUS  Culture, blood (Routine X 2) w Reflex to ID Panel     Status: None (Preliminary result)   Collection Time: 10/02/24  6:36 AM   Specimen: BLOOD  Result Value Ref Range Status   Specimen Description BLOOD BLOOD LEFT ARM  Final   Special Requests   Final    BOTTLES DRAWN AEROBIC AND ANAEROBIC Blood Culture adequate volume   Culture   Final    NO GROWTH 1 DAY Performed at [facility], [address], [place], [place] [address]    Report Status PENDING  Incomplete  Culture, blood (Routine X 2) w Reflex to ID Panel     Status: None  (Preliminary result)   Collection Time: 10/02/24  6:36 AM   Specimen: BLOOD  Result Value Ref Range Status   Specimen Description BLOOD BLOOD LEFT ARM  Final   Special Requests   Final    BOTTLES DRAWN AEROBIC AND ANAEROBIC Blood Culture adequate volume   Culture   Final    NO GROWTH 1 DAY Performed at [facility], [address] [place], [place] [address]    Report Status PENDING  Incomplete    Coagulation Studies: No results for input(s): LABPROT, INR in the last 72 hours.   Urinalysis: No results for input(s): COLORURINE, LABSPEC, PHURINE, GLUCOSEU, HGBUR, BILIRUBINUR, KETONESUR, PROTEINUR, UROBILINOGEN, NITRITE, LEUKOCYTESUR in the last 72 hours.  Invalid input(s): APPERANCEUR     Imaging: ECHOCARDIOGRAM COMPLETE Result Date: 10/02/2024    ECHOCARDIOGRAM REPORT   Patient Name:   KALUM MINNER Date of Exam: 10/02/2024 Medical Rec #:  [id number]      Height:       70.0 in Accession #:    [id number]     Weight:       272.5 lb Date of Birth:  Feb 01, 1961      BSA:          2.381 m Patient Age:    63 years       BP:           147/68 mmHg Patient Gender: M              HR:           79 bpm. Exam Location:  [facility] Procedure: 2D Echo, Cardiac Doppler and Color Doppler (Both Spectral and Color            Flow Doppler were utilized during procedure). Indications:  Bacteremia R78.81  History:         Patient has prior history of Echocardiogram examinations, most                  recent 04/19/2024. Risk Factors:Dyslipidemia and Diabetes. End                  stage renal disease.  Sonographer:     Christopher Furnace Referring Phys:  [id number] DONALD BERLIN Diagnosing Phys: Caron Poser  Sonographer Comments: Technically challenging study due to limited acoustic windows, no apical window and no subcostal window. Image acquisition challenging due to patient body habitus. IMPRESSIONS  1. Very technically difficult study.  2. The mitral valve was not well visualized.  No evidence of mitral valve regurgitation. No evidence of mitral stenosis.  3. The aortic valve has an indeterminant number of cusps. Aortic valve regurgitation is not visualized. No aortic stenosis is present.  4. Left ventricular ejection fraction, by estimation, is 55 to 60% based on parasternal windows only. The left ventricle probably has normal function, though very poorly visualized. Left ventricular endocardial border not optimally defined to evaluate regional wall motion. Left ventricular diastolic parameters are indeterminate.  5. Right ventricular systolic function was not well visualized. The right ventricular size is not well visualized. Comparison(s): A prior study was performed on 04/19/2024. No significant change, though unable to accurately compare due to poor image quality. FINDINGS  Left Ventricle: Left ventricular ejection fraction, by estimation, is 55 to 60%. The left ventricle has normal function. Left ventricular endocardial border not optimally defined to evaluate regional wall motion. The left ventricular internal cavity size was normal in size. Suboptimal image quality limits for assessment of left ventricular hypertrophy. Left ventricular diastolic parameters are indeterminate. Right Ventricle: The right ventricular size is not well visualized. Right vetricular wall thickness was not well visualized. Right ventricular systolic function was not well visualized. Left Atrium: Left atrial size was not well visualized. Right Atrium: Right atrial size was not well visualized. Pericardium: There is no evidence of pericardial effusion. Mitral Valve: The mitral valve was not well visualized. No evidence of mitral valve regurgitation. No evidence of mitral valve stenosis. Tricuspid Valve: The tricuspid valve is not well visualized. Tricuspid valve regurgitation is not demonstrated. No evidence of tricuspid stenosis. Aortic Valve: The aortic valve has an indeterminant number of cusps. Aortic valve  regurgitation is not visualized. No aortic stenosis is present. Pulmonic Valve: The pulmonic valve was not well visualized. Pulmonic valve regurgitation is not visualized. No evidence of pulmonic stenosis. Aorta: The aortic root was not well visualized. IAS/Shunts: The interatrial septum was not well visualized.  LEFT VENTRICLE PLAX 2D LVIDd:         3.07 cm LVIDs:         1.93 cm LV PW:         2.62 cm LV IVS:        1.54 cm LVOT diam:     2.10 cm LVOT Area:     3.46 cm  LEFT ATRIUM         Index LA diam:    3.30 cm 1.39 cm/m   AORTA Ao Root diam: 3.70 cm  SHUNTS Systemic Diam: 2.10 cm Caron Poser Electronically signed by Caron Poser Signature Date/Time: 10/02/2024/2:07:14 PM    Final      Medications:    promethazine  (PHENERGAN ) injection (IM or IVPB) 6.25 mg (10/03/24 0817)    bisacodyl   10 mg Oral QHS   carvedilol   6.25 mg Oral BID WC   Chlorhexidine  Gluconate Cloth  6 each Topical Q0600   cyanocobalamin   1,000 mcg Oral Daily   diltiazem   240 mg Oral QPM   doxepin   10 mg Oral QHS   famotidine   10 mg Oral Daily   folic acid   1 mg Oral Daily   gabapentin   300 mg Oral QHS   heparin   5,000 Units Subcutaneous Q8H   insulin  aspart  0-5 Units Subcutaneous QHS   insulin  aspart  0-6 Units Subcutaneous TID WC   insulin  aspart  5 Units Subcutaneous TID WC   insulin  glargine  18 Units Subcutaneous QHS   lactulose   20 g Oral TID   levothyroxine   175 mcg Oral QHS   linaclotide   145 mcg Oral QAC breakfast   loratadine   10 mg Oral Daily   metoCLOPramide   5 mg Oral TID AC   polyethylene glycol  17 g Oral BID   senna-docusate  1 tablet Oral BID   simethicone   80 mg Oral QID   vancomycin  variable dose per unstable renal function (pharmacist dosing)   Does not apply See admin instructions   acetaminophen  **OR** acetaminophen , alteplase , bisacodyl , guaiFENesin , heparin , ipratropium-albuterol , liver oil-zinc  oxide, ondansetron  **OR** ondansetron  (ZOFRAN ) IV, oxyCODONE , promethazine  (PHENERGAN )  injection (IM or IVPB), sodium phosphate   Assessment/ Plan:  Eugene [name] is a 64 y.o.  male with past medical conditions including diabetes, hepatitis C, bilateral BKA, anxiety and depression, and end-stage renal disease on hemodialysis.  Patient presents to the emergency department from his facility complaining of shortness of breath.  Patient is currently admitted for SIRS (systemic inflammatory response syndrome) (HCC) [R65.10]  [facility]/MWF/120.5 kg  End-stage renal disease on hemodialysis.         Increased oxygen requirement 2/2 fluid volume overload Last treatment received on 1/7, 30 min.  Due to infection, appreciate vascular removing right IJ PermCath on 1/8.  Will maintain line free holiday for at least 2 days or if patient tolerates. Maintain on renal diet to limit potassium and fluid intake. Patient refused temp cath placement today for dialysis to remove fluid. Will plan to place PC on Monday, then dialysis treatment. Make NPO after midnight tomorrow.  Anemia of chronic kidney disease Lab Results  Component Value Date   HGB 10.0 (L) 10/03/2024   Hemoglobin stable, no need for ESA at this time  3.  Systemic inflammatory response syndrome, suspected due to fever, leukocytosis, and tachypnea on ED arrival.  Suspected source of suprapubic catheter. Abdominal x-ray shows moderate diffuse air distention of the bowel suspicious for ileus.  Blood culture positive for MRSA. Vascular removed PermCath on 1/8 and patient experiencing line holiday.  Continues to receive vancomycin  ordered per primary team.  Cefepime  stopped  4. Secondary Hyperparathyroidism: with outpatient labs: None available  Lab Results  Component Value Date   PTH 177 (H) 04/23/2024   CALCIUM  7.6 (L) 10/03/2024   PHOS 5.0 (H) 10/02/2024   Corrected calcium  7.8 Phosphorus within parameters. Will continue to monitor bone minerals this admission.   LOS: 4 Tiffny Gemmer SHAUNNA Dines 1/10/20262:05 PM   "

## 2024-10-03 NOTE — Progress Notes (Signed)
 " Progress Note   Patient: Eugene [name] FMW:[id number] DOB: 1961-07-05 DOA: 09/29/2024     4 DOS: the patient was seen and examined on 10/03/2024   Brief hospital course: HPI: Eugene [name] is a 64 y.o. male with medical history significant of anxiety, depression, type 2 diabetes mellitus, history of hepatitis C, end-stage renal disease on dialysis, bilateral BKA.  Patient known to most historian.  States he is short of breath.  He is sick to his stomach.  He vomited.  No diarrhea.  He is short of breath.  He is having a fever.  In the ER he was found to have an elevated white blood cell count and started on sepsis protocol.  Respiratory panel pending, chest x-ray shows mild interstitial edema and left greater than right pleural effusion.  Urine looks positive and patient has a suprapubic catheter.  Hospitalist services contacted for further evaluation.   Assessment and Plan: *Sepsis secondary to MRSA bacteremia Present on admission.  Sepsis criteria fever leukocytosis and tachycardia.  Now resolved.  MRSA positive in 4 out of 4 bottles. HD catheter removed 1/8 Had fever to 101.1 F overnight Continue vancomycin  -- Follow repeat blood cultures  Normocytic anemia Thrombocytopenia Likely in settong of anemia of chronic disease and infection. H/o B12 deficiency Continue B12 supplementation, folate borderline, continue supplementation  Abdominal discomfort N/V  Unclear etiology.  Improved after a large bowel movement.  Possible component of gastroparesis.  CT of the abdomen pelvis with contrast notable for proctocolitis but no findings to suggest etiology of his nausea.   --Continue antiemetics, aggressive bowel regimen -- Continue trial of Reglan   ESRD (end stage renal disease) (HCC) Hemodialysis per nephrology  Paroxysmal SVT (supraventricular tachycardia) On Cardizem  and Coreg   Heart failure with preserved ejection fraction (HCC) Dialysis to manage fluid  Acute on chronic  respiratory failure with hypoxia [facility]) Patient states that he is on 6 L of oxygen at home.  He has required up to 10 L of oxygen here.  However he has had appropriate oxygen saturations at 6 L Maintain O2 saturations greater than 92%  Asymptomatic bacteruria  Ucx with proteus mirabilis, and MRSA. MRSA due to MRSA bacteremia likely in setting of line infection.   Receiving anti MRSA antibiotics.   HTN Blood pressure remains slightly elevated. Continue to hold midodrine    Chronic bilateral pleural effusions Small.  Left greater than right.  Hd.   Chronic pain On oxycodone   Uncontrolled type 2 diabetes mellitus with hyperglycemia, with long-term current use of insulin  (HCC) A1c 7.3 though ESRD.  Remains poorly controlled.  Increase Lantus  to 18 units and NovoLog  to 5 units 3 times daily with meals.  Chronic constipation Patient on numerous medications for constipation.  Had a bowel movement this a.m. --Continue with aggressive bowel regimen   Obesity (BMI 30-39.9) BMI 37.64 Complicates care  Hypothyroidism On levothyroxine        Subjective: Nausea is improved after large bowel movement this a.m.  Patient states that he is able to eat as well.  Physical Exam: Vitals:   10/03/24 0500 10/03/24 0743 10/03/24 0950 10/03/24 1351  BP: 136/65 (!) 145/70    Pulse: 84 88    Resp:  18    Temp: 98.6 F (37 C) 100.3 F (37.9 C) 98.8 F (37.1 C)   TempSrc: Oral Oral Oral   SpO2: 92% 90%  93%  Weight:      Height:         Constitutional: In no distress.  Cardiovascular:  Normal rate, regular rhythm. No lower extremity edema  Pulmonary: Non labored breathing on nasal cannula, no wheezing or rales.   Abdominal: Soft. Non distended and non tender Musculoskeletal: BKA bilaterally Neurological: Alert and oriented to person, place, and time. Non focal  Skin: Skin is warm and dry.    Data Reviewed:     Latest Ref Rng & Units 10/03/2024    6:28 AM 10/02/2024    6:27 AM  10/01/2024    1:30 PM  BMP  Glucose 70 - 99 mg/dL 706  796  738   BUN 8 - 23 mg/dL 72  66  65   Creatinine 0.61 - 1.24 mg/dL 4.84  5.26  5.52   Sodium 135 - 145 mmol/L 138  140  138   Potassium 3.5 - 5.1 mmol/L 4.3  4.0  4.3   Chloride 98 - 111 mmol/L 104  104  102   CO2 22 - 32 mmol/L 24  22  23    Calcium  8.9 - 10.3 mg/dL 7.6  7.6  8.1       Latest Ref Rng & Units 10/03/2024    6:28 AM 10/01/2024    1:30 PM 09/30/2024    4:25 AM  CBC  WBC 4.0 - 10.5 K/uL 8.5  10.8  16.8   Hemoglobin 13.0 - 17.0 g/dL 89.9  89.5  9.9   Hematocrit 39.0 - 52.0 % 31.4  32.3  32.7   Platelets 150 - 400 K/uL 85  93  118      Family Communication: None at bedside.   Disposition: Status is: Inpatient Remains inpatient appropriate because: MRSA bacteremia, IV antibiotics  Planned Discharge Destination: Pending clinical course     Time spent: 35 minutes  Author: Alban Pepper, MD 10/03/2024 3:59 PM  For on call review [email or web address].  "

## 2024-10-03 NOTE — Plan of Care (Signed)

## 2024-10-04 DIAGNOSIS — R7881 Bacteremia: Secondary | ICD-10-CM | POA: Diagnosis not present

## 2024-10-04 DIAGNOSIS — B9562 Methicillin resistant Staphylococcus aureus infection as the cause of diseases classified elsewhere: Secondary | ICD-10-CM | POA: Diagnosis not present

## 2024-10-04 LAB — BASIC METABOLIC PANEL WITH GFR
Anion gap: 10 (ref 5–15)
BUN: 75 mg/dL — ABNORMAL HIGH (ref 8–23)
CO2: 22 mmol/L (ref 22–32)
Calcium: 7.6 mg/dL — ABNORMAL LOW (ref 8.9–10.3)
Chloride: 105 mmol/L (ref 98–111)
Creatinine, Ser: 5.39 mg/dL — ABNORMAL HIGH (ref 0.61–1.24)
GFR, Estimated: 11 mL/min — ABNORMAL LOW
Glucose, Bld: 277 mg/dL — ABNORMAL HIGH (ref 70–99)
Potassium: 4 mmol/L (ref 3.5–5.1)
Sodium: 137 mmol/L (ref 135–145)

## 2024-10-04 LAB — GLUCOSE, CAPILLARY
Glucose-Capillary: 234 mg/dL — ABNORMAL HIGH (ref 70–99)
Glucose-Capillary: 291 mg/dL — ABNORMAL HIGH (ref 70–99)
Glucose-Capillary: 241 mg/dL — ABNORMAL HIGH (ref 70–99)
Glucose-Capillary: 281 mg/dL — ABNORMAL HIGH (ref 70–99)

## 2024-10-04 LAB — MAGNESIUM: Magnesium: 1.9 mg/dL (ref 1.7–2.4)

## 2024-10-04 LAB — CBC
HCT: 30.9 % — ABNORMAL LOW (ref 39.0–52.0)
Hemoglobin: 9.8 g/dL — ABNORMAL LOW (ref 13.0–17.0)
MCH: 30 pg (ref 26.0–34.0)
MCHC: 31.7 g/dL (ref 30.0–36.0)
MCV: 94.5 fL (ref 80.0–100.0)
Platelets: 102 K/uL — ABNORMAL LOW (ref 150–400)
RBC: 3.27 MIL/uL — ABNORMAL LOW (ref 4.22–5.81)
RDW: 13.9 % (ref 11.5–15.5)
WBC: 7.9 K/uL (ref 4.0–10.5)
nRBC: 0 % (ref 0.0–0.2)

## 2024-10-04 MED ORDER — INSULIN GLARGINE 100 UNIT/ML ~~LOC~~ SOLN
22.0000 [IU] | Freq: Every day | SUBCUTANEOUS | Status: DC
  Filled 2024-10-04: qty 0.22

## 2024-10-04 MED ORDER — ALBUTEROL SULFATE (2.5 MG/3ML) 0.083% IN NEBU
2.5000 mg | INHALATION_SOLUTION | RESPIRATORY_TRACT | Status: DC | PRN
  Administered 2024-10-04 – 2024-10-14 (×21): 2.5 mg via RESPIRATORY_TRACT
  Filled 2024-10-04 (×22): qty 3

## 2024-10-04 MED ORDER — INSULIN ASPART 100 UNIT/ML IJ SOLN
7.0000 [IU] | Freq: Three times a day (TID) | INTRAMUSCULAR | Status: DC
  Administered 2024-10-04 – 2024-10-06 (×4): 7 [IU] via SUBCUTANEOUS
  Filled 2024-10-04 (×5): qty 7

## 2024-10-04 MED ORDER — PROMETHAZINE (PHENERGAN) 6.25MG IN NS 50ML IVPB
6.2500 mg | Freq: Three times a day (TID) | INTRAVENOUS | Status: DC | PRN
  Administered 2024-10-04 – 2024-10-06 (×6): 6.25 mg via INTRAVENOUS
  Filled 2024-10-04 (×3): qty 50
  Filled 2024-10-04 (×2): qty 6.25
  Filled 2024-10-04: qty 50

## 2024-10-04 MED ORDER — INSULIN GLARGINE 100 UNIT/ML ~~LOC~~ SOLN
20.0000 [IU] | Freq: Every day | SUBCUTANEOUS | Status: DC
  Administered 2024-10-04: 20 [IU] via SUBCUTANEOUS
  Filled 2024-10-04: qty 0.2

## 2024-10-04 MED ORDER — ANTICOAGULANT SODIUM CITRATE 4% (200MG/5ML) IV SOLN: 5.0000 mL | Status: DC | PRN

## 2024-10-04 MED ORDER — ALBUTEROL SULFATE (2.5 MG/3ML) 0.083% IN NEBU
2.5000 mg | INHALATION_SOLUTION | Freq: Once | RESPIRATORY_TRACT | Status: AC
  Administered 2024-10-04: 2.5 mg via RESPIRATORY_TRACT
  Filled 2024-10-04: qty 3

## 2024-10-04 MED ORDER — LACTULOSE 10 GM/15ML PO SOLN
20.0000 g | Freq: Three times a day (TID) | ORAL | Status: AC
  Administered 2024-10-05: 20 g via ORAL
  Filled 2024-10-04 (×2): qty 30

## 2024-10-04 MED ORDER — PANTOPRAZOLE SODIUM 40 MG PO TBEC
40.0000 mg | DELAYED_RELEASE_TABLET | Freq: Every day | ORAL | Status: DC
  Administered 2024-10-04 – 2024-10-15 (×11): 40 mg via ORAL
  Filled 2024-10-04 (×11): qty 1

## 2024-10-04 NOTE — Progress Notes (Signed)
 " [facility]  ROUNDING NOTE   Subjective:   Eugene [name] is a 64 year old male with past medical conditions including diabetes, hepatitis C, bilateral BKA, anxiety and depression, and end-stage renal disease on hemodialysis.  Patient presents to the emergency department from his facility complaining of shortness of breath.  Patient is currently admitted for SIRS (systemic inflammatory response syndrome) (HCC) [R65.10]  Patient is known to our practice and receives outpatient dialysis treatments at [facility] on a MWF schedule, supervised by Dr. Dennise.    Update: Patient in bed, endorses poor sleep. Report BM yesterday. Denies pain. Patient acknowledges plan for PC placement and dialysis tomorrow.    Objective:  Vital signs in last 24 hours:  Temp:  [98.6 F (37 C)-99.7 F (37.6 C)] 98.8 F (37.1 C) (01/11 0812) Pulse Rate:  [79-96] 79 (01/11 0812) Resp:  [16-20] 16 (01/11 0812) BP: (123-149)/(59-72) 131/67 (01/11 0812) SpO2:  [89 %-100 %] 94 % (01/11 1135)  Weight change:  Filed Weights   09/29/24 1412 09/30/24 1043  Weight: 119 kg 123.6 kg    Intake/Output: I/O last 3 completed shifts: In: 150 [P.O.:50; IV Piggyback:100] Out: 1100 [Urine:1100]   Intake/Output this shift:  No intake/output data recorded.  Physical Exam: General: NAD  Head: Normocephalic  Eyes: Anicteric  Lungs:  Scattered wheeze, [place] 10 Liters  Heart: Regular rate and rhythm  Abdomen:  Soft, nontender  Extremities: Trace edema.  Bilateral BKA  Neurologic: Awake, alert, conversant  Skin: Warm,dry, no rash  Access: None-line holiday    Basic Metabolic Panel: Recent Labs  Lab 09/30/24 0727 10/01/24 1330 10/02/24 0627 10/03/24 0628 10/04/24 0802  NA 136 138 140 138 137  K 5.1 4.3 4.0 4.3 4.0  CL 101 102 104 104 105  CO2 22 23 22 24 22   GLUCOSE 398* 261* 203* 293* 277*  BUN 70* 65* 66* 72* 75*  CREATININE 4.81* 4.47* 4.73* 5.15* 5.39*  CALCIUM  8.2* 8.1* 7.6* 7.6* 7.6*   MG  --   --   --   --  1.9  PHOS  --   --  5.0*  --   --     Liver Function Tests: Recent Labs  Lab 09/29/24 1437  AST 21  ALT 28  ALKPHOS 83  BILITOT 0.4  PROT 6.5  ALBUMIN  3.2*   Recent Labs  Lab 09/30/24 0727  LIPASE <10*   No results for input(s): AMMONIA in the last 168 hours.  CBC: Recent Labs  Lab 09/29/24 1437 09/30/24 0425 10/01/24 1330 10/03/24 0628 10/04/24 0802  WBC 26.4* 16.8* 10.8* 8.5 7.9  NEUTROABS 23.2*  --  9.3*  --   --   HGB 11.5* 9.9* 10.4* 10.0* 9.8*  HCT 36.1* 32.7* 32.3* 31.4* 30.9*  MCV 94.0 97.0 93.1 94.0 94.5  PLT 139* 118* 93* 85* 102*    Cardiac Enzymes: No results for input(s): CKTOTAL, CKMB, CKMBINDEX, TROPONINI in the last 168 hours.  BNP: Invalid input(s): POCBNP  CBG: Recent Labs  Lab 10/03/24 0813 10/03/24 1228 10/03/24 1607 10/03/24 2112 10/04/24 0813  GLUCAP 274* 277* 270* 316* 234*    Microbiology: Results for orders placed or performed during the hospital encounter of 09/29/24  Resp panel by RT-PCR (RSV, Flu A&B, Covid) Anterior Nasal Swab     Status: None   Collection Time: 09/29/24  2:16 PM   Specimen: Anterior Nasal Swab  Result Value Ref Range Status   SARS Coronavirus 2 by RT PCR NEGATIVE NEGATIVE Final  Comment: (NOTE) SARS-CoV-2 target nucleic acids are NOT DETECTED.  The SARS-CoV-2 RNA is generally detectable in upper respiratory specimens during the acute phase of infection. The lowest concentration of SARS-CoV-2 viral copies this assay can detect is 138 copies/mL. A negative result does not preclude SARS-Cov-2 infection and should not be used as the sole basis for treatment or other patient management decisions. A negative result may occur with  improper specimen collection/handling, submission of specimen other than nasopharyngeal swab, presence of viral mutation(s) within the areas targeted by this assay, and inadequate number of viral copies(<138 copies/mL). A negative result  must be combined with clinical observations, patient history, and epidemiological information. The expected result is Negative.  Fact Sheet for Patients:  bloggercourse.com  Fact Sheet for Healthcare Providers:  seriousbroker.it  This test is no t yet approved or cleared by the [place]  FDA and  has been authorized for detection and/or diagnosis of SARS-CoV-2 by FDA under an Emergency Use Authorization (EUA). This EUA will remain  in effect (meaning this test can be used) for the duration of the COVID-19 declaration under Section 564(b)(1) of the Act, 21 U.S.C.section 360bbb-3(b)(1), unless the authorization is terminated  or revoked sooner.       Influenza A by PCR NEGATIVE NEGATIVE Final   Influenza B by PCR NEGATIVE NEGATIVE Final    Comment: (NOTE) The Xpert Xpress SARS-CoV-2/FLU/RSV plus assay is intended as an aid in the diagnosis of influenza from Nasopharyngeal swab specimens and should not be used as a sole basis for treatment. Nasal washings and aspirates are unacceptable for Xpert Xpress SARS-CoV-2/FLU/RSV testing.  Fact Sheet for Patients: bloggercourse.com  Fact Sheet for Healthcare Providers: seriousbroker.it  This test is not yet approved or cleared by the [place]  FDA and has been authorized for detection and/or diagnosis of SARS-CoV-2 by FDA under an Emergency Use Authorization (EUA). This EUA will remain in effect (meaning this test can be used) for the duration of the COVID-19 declaration under Section 564(b)(1) of the Act, 21 U.S.C. section 360bbb-3(b)(1), unless the authorization is terminated or revoked.     Resp Syncytial Virus by PCR NEGATIVE NEGATIVE Final    Comment: (NOTE) Fact Sheet for Patients: bloggercourse.com  Fact Sheet for Healthcare Providers: seriousbroker.it  This test is  not yet approved or cleared by the [place]  FDA and has been authorized for detection and/or diagnosis of SARS-CoV-2 by FDA under an Emergency Use Authorization (EUA). This EUA will remain in effect (meaning this test can be used) for the duration of the COVID-19 declaration under Section 564(b)(1) of the Act, 21 U.S.C. section 360bbb-3(b)(1), unless the authorization is terminated or revoked.  Performed at [facility], [address] [place], [place] [address]   Urine Culture     Status: Abnormal   Collection Time: 09/29/24  2:17 PM   Specimen: Urine, Random  Result Value Ref Range Status   Specimen Description   Final    URINE, RANDOM Performed at [facility], [address], [place], [place] [address]    Special Requests   Final    NONE Reflexed from [id number] Performed at [facility], [address] [place], [place] [address]    Culture (A)  Final    >=100,000 COLONIES/mL PROTEUS MIRABILIS >=100,000 COLONIES/mL METHICILLIN RESISTANT STAPHYLOCOCCUS AUREUS    Report Status 10/02/2024 FINAL  Final   Organism ID, Bacteria PROTEUS MIRABILIS (A)  Final   Organism ID, Bacteria METHICILLIN RESISTANT STAPHYLOCOCCUS AUREUS (A)  Final  Susceptibility   Methicillin resistant staphylococcus aureus - MIC*    CIPROFLOXACIN  >=8 RESISTANT Resistant     GENTAMICIN <=0.5 SENSITIVE Sensitive     NITROFURANTOIN <=16 SENSITIVE Sensitive     OXACILLIN >=4 RESISTANT Resistant     TETRACYCLINE <=1 SENSITIVE Sensitive     VANCOMYCIN  1 SENSITIVE Sensitive     TRIMETH /SULFA  <=10 SENSITIVE Sensitive     RIFAMPIN  <=0.5 SENSITIVE Sensitive     Inducible Clindamycin NEGATIVE Sensitive     LINEZOLID  2 SENSITIVE Sensitive     * >=100,000 COLONIES/mL METHICILLIN RESISTANT STAPHYLOCOCCUS AUREUS   Proteus mirabilis - MIC*    AMPICILLIN <=2 SENSITIVE Sensitive     CEFAZOLIN  (URINE) Value in next row Sensitive      4 SENSITIVEThis is a modified FDA-approved test  that has been validated and its performance characteristics determined by the reporting laboratory.  This laboratory is certified under the Clinical Laboratory Improvement Amendments CLIA as qualified to perform high complexity clinical laboratory testing.    CEFEPIME  Value in next row Sensitive      4 SENSITIVEThis is a modified FDA-approved test that has been validated and its performance characteristics determined by the reporting laboratory.  This laboratory is certified under the Clinical Laboratory Improvement Amendments CLIA as qualified to perform high complexity clinical laboratory testing.    ERTAPENEM Value in next row Sensitive      4 SENSITIVEThis is a modified FDA-approved test that has been validated and its performance characteristics determined by the reporting laboratory.  This laboratory is certified under the Clinical Laboratory Improvement Amendments CLIA as qualified to perform high complexity clinical laboratory testing.    CEFTRIAXONE  Value in next row Sensitive      4 SENSITIVEThis is a modified FDA-approved test that has been validated and its performance characteristics determined by the reporting laboratory.  This laboratory is certified under the Clinical Laboratory Improvement Amendments CLIA as qualified to perform high complexity clinical laboratory testing.    CIPROFLOXACIN  Value in next row Resistant      4 SENSITIVEThis is a modified FDA-approved test that has been validated and its performance characteristics determined by the reporting laboratory.  This laboratory is certified under the Clinical Laboratory Improvement Amendments CLIA as qualified to perform high complexity clinical laboratory testing.    GENTAMICIN Value in next row Sensitive      4 SENSITIVEThis is a modified FDA-approved test that has been validated and its performance characteristics determined by the reporting laboratory.  This laboratory is certified under the Clinical Laboratory Improvement  Amendments CLIA as qualified to perform high complexity clinical laboratory testing.    NITROFURANTOIN Value in next row Resistant      4 SENSITIVEThis is a modified FDA-approved test that has been validated and its performance characteristics determined by the reporting laboratory.  This laboratory is certified under the Clinical Laboratory Improvement Amendments CLIA as qualified to perform high complexity clinical laboratory testing.    TRIMETH /SULFA  Value in next row Sensitive      4 SENSITIVEThis is a modified FDA-approved test that has been validated and its performance characteristics determined by the reporting laboratory.  This laboratory is certified under the Clinical Laboratory Improvement Amendments CLIA as qualified to perform high complexity clinical laboratory testing.    AMPICILLIN/SULBACTAM Value in next row Sensitive      4 SENSITIVEThis is a modified FDA-approved test that has been validated and its performance characteristics determined by the reporting laboratory.  This laboratory is certified under the Clinical Laboratory Improvement  Amendments CLIA as qualified to perform high complexity clinical laboratory testing.    PIP/TAZO Value in next row Sensitive      <=4 SENSITIVEThis is a modified FDA-approved test that has been validated and its performance characteristics determined by the reporting laboratory.  This laboratory is certified under the Clinical Laboratory Improvement Amendments CLIA as qualified to perform high complexity clinical laboratory testing.    MEROPENEM Value in next row Sensitive      <=4 SENSITIVEThis is a modified FDA-approved test that has been validated and its performance characteristics determined by the reporting laboratory.  This laboratory is certified under the Clinical Laboratory Improvement Amendments CLIA as qualified to perform high complexity clinical laboratory testing.    * >=100,000 COLONIES/mL PROTEUS MIRABILIS  Blood Culture (routine x 2)      Status: Abnormal   Collection Time: 09/29/24  2:37 PM   Specimen: BLOOD  Result Value Ref Range Status   Specimen Description   Final    BLOOD BLOOD RIGHT FOREARM Performed at [facility], [address], [place], [place] [address]    Special Requests   Final    BOTTLES DRAWN AEROBIC AND ANAEROBIC Blood Culture results may not be optimal due to an inadequate volume of blood received in culture bottles Performed at [facility], [address] [place], [place] [address]    Culture  Setup Time   Final    GRAM POSITIVE COCCI IN BOTH AEROBIC AND ANAEROBIC BOTTLES CRITICAL VALUE NOTED.  VALUE IS CONSISTENT WITH PREVIOUSLY REPORTED AND CALLED VALUE.    Culture (A)  Final    STAPHYLOCOCCUS AUREUS SUSCEPTIBILITIES PERFORMED ON PREVIOUS CULTURE WITHIN THE LAST 5 DAYS. Performed at [facility], [address] [place], [place] [address]    Report Status 10/02/2024 FINAL  Final  Blood Culture (routine x 2)     Status: Abnormal (Preliminary result)   Collection Time: 09/29/24  2:37 PM   Specimen: BLOOD  Result Value Ref Range Status   Specimen Description   Final    BLOOD LEFT ANTECUBITAL Performed at [facility], [address] [place], [place] [address]    Special Requests   Final    BOTTLES DRAWN AEROBIC AND ANAEROBIC Blood Culture results may not be optimal due to an inadequate volume of blood received in culture bottles Performed at [facility], [address], [place], [place] [address]    Culture  Setup Time   Final    GRAM POSITIVE COCCI IN BOTH AEROBIC AND ANAEROBIC BOTTLES CRITICAL RESULT CALLED TO, READ BACK BY AND VERIFIED WITH: PHARMD NATHAN B ON [id number] @0457  BY HNM Performed at [facility], [address], [place], [place] [address]    Culture METHICILLIN RESISTANT STAPHYLOCOCCUS AUREUS (A)  Final   Report Status PENDING  Incomplete   Organism ID, Bacteria METHICILLIN RESISTANT STAPHYLOCOCCUS AUREUS  Final       Susceptibility   Methicillin resistant staphylococcus aureus - MIC*    CIPROFLOXACIN  >=8 RESISTANT Resistant     ERYTHROMYCIN  >=8 RESISTANT Resistant     GENTAMICIN <=0.5 SENSITIVE Sensitive     OXACILLIN >=4 RESISTANT Resistant     TETRACYCLINE <=1 SENSITIVE Sensitive     VANCOMYCIN  1 SENSITIVE Sensitive     TRIMETH /SULFA  <=10 SENSITIVE Sensitive     CLINDAMYCIN >=8 RESISTANT Resistant     RIFAMPIN  <=0.5 SENSITIVE Sensitive     Inducible Clindamycin NEGATIVE Sensitive     LINEZOLID  2 SENSITIVE Sensitive     *  METHICILLIN RESISTANT STAPHYLOCOCCUS AUREUS  Blood Culture ID Panel (Reflexed)     Status: Abnormal   Collection Time: 09/29/24  2:37 PM  Result Value Ref Range Status   Enterococcus faecalis NOT DETECTED NOT DETECTED Final   Enterococcus Faecium NOT DETECTED NOT DETECTED Final   Listeria monocytogenes NOT DETECTED NOT DETECTED Final   Staphylococcus species DETECTED (A) NOT DETECTED Final    Comment: CRITICAL RESULT CALLED TO, READ BACK BY AND VERIFIED WITHBETHA RANKIN DILLS PHARMD [id number] 09/30/24 HNM    Staphylococcus aureus (BCID) DETECTED (A) NOT DETECTED Final    Comment: Methicillin (oxacillin)-resistant Staphylococcus aureus (MRSA). MRSA is predictably resistant to beta-lactam antibiotics (except ceftaroline). Preferred therapy is vancomycin  unless clinically contraindicated. Patient requires contact precautions if  hospitalized. CRITICAL RESULT CALLED TO, READ BACK BY AND VERIFIED WITH: RANKIN DILLS PHARMD [id number] 09/30/24 HNM    Staphylococcus epidermidis NOT DETECTED NOT DETECTED Final   Staphylococcus lugdunensis NOT DETECTED NOT DETECTED Final   Streptococcus species NOT DETECTED NOT DETECTED Final   Streptococcus agalactiae NOT DETECTED NOT DETECTED Final   Streptococcus pneumoniae NOT DETECTED NOT DETECTED Final   Streptococcus pyogenes NOT DETECTED NOT DETECTED Final   A.calcoaceticus-baumannii NOT DETECTED NOT DETECTED Final   Bacteroides fragilis NOT DETECTED NOT  DETECTED Final   Enterobacterales NOT DETECTED NOT DETECTED Final   Enterobacter cloacae complex NOT DETECTED NOT DETECTED Final   Escherichia coli NOT DETECTED NOT DETECTED Final   Klebsiella aerogenes NOT DETECTED NOT DETECTED Final   Klebsiella oxytoca NOT DETECTED NOT DETECTED Final   Klebsiella pneumoniae NOT DETECTED NOT DETECTED Final   Proteus species NOT DETECTED NOT DETECTED Final   Salmonella species NOT DETECTED NOT DETECTED Final   Serratia marcescens NOT DETECTED NOT DETECTED Final   Haemophilus influenzae NOT DETECTED NOT DETECTED Final   Neisseria meningitidis NOT DETECTED NOT DETECTED Final   Pseudomonas aeruginosa NOT DETECTED NOT DETECTED Final   Stenotrophomonas maltophilia NOT DETECTED NOT DETECTED Final   Candida albicans NOT DETECTED NOT DETECTED Final   Candida auris NOT DETECTED NOT DETECTED Final   Candida glabrata NOT DETECTED NOT DETECTED Final   Candida krusei NOT DETECTED NOT DETECTED Final   Candida parapsilosis NOT DETECTED NOT DETECTED Final   Candida tropicalis NOT DETECTED NOT DETECTED Final   Cryptococcus neoformans/gattii NOT DETECTED NOT DETECTED Final   Meth resistant mecA/C and MREJ DETECTED (A) NOT DETECTED Final    Comment: CRITICAL RESULT CALLED TO, READ BACK BY AND VERIFIED WITHBETHA RANKIN DILLS PHARMD [id number] 09/30/24 HNM Performed at [facility], [address] [place], [place] [address]   MIC (1 Drug)-     Status: Abnormal (Preliminary result)   Collection Time: 09/29/24  2:37 PM  Result Value Ref Range Status   Min Inhibitory Conc (1 Drug) Preliminary report (A)  Final    Comment: (NOTE) Performed At: [facility] [employer] [address] [place], [place] [id number] Jennette Shorter MD Ey:[phone]    Source BLOOD  Final    Comment: Performed at [facility], [address], [place], [place] [address]  MIC Result     Status: Abnormal   Collection Time: 09/29/24  2:37 PM  Result Value Ref Range Status   Result 1 (MIC)  Comment (A)  Final    Comment: (NOTE) Methicillin - resistant Staphylococcus aureus Identification performed by account, not confirmed by this laboratory. Performed At: [facility] [address] [place], [place] [id number] Jennette Shorter MD Ey:[id number]   MRSA Next Gen by PCR, Nasal  Status: Abnormal   Collection Time: 09/30/24  5:00 PM   Specimen: Nasal Mucosa; Nasal Swab  Result Value Ref Range Status   MRSA by PCR Next Gen DETECTED (A) NOT DETECTED Final    Comment: RESULT CALLED TO, READ BACK BY AND VERIFIED WITH: TAMMY ORLANDO 2032 09/30/24 MU (NOTE) The GeneXpert MRSA Assay (FDA approved for NASAL specimens only), is one component of a comprehensive MRSA colonization surveillance program. It is not intended to diagnose MRSA infection nor to guide or monitor treatment for MRSA infections. Test performance is not FDA approved in patients less than 27 years old. Performed at [facility], [address], [place], [place] [address]   Cath Tip Culture     Status: Abnormal   Collection Time: 10/01/24 11:03 AM   Specimen: Catheter Tip; Other  Result Value Ref Range Status   Specimen Description   Final    CATH TIP Performed at [facility], [address] [place], [place] [address]    Special Requests   Final    NONE Performed at [facility], [address] [place], [place] [address]    Culture METHICILLIN RESISTANT STAPHYLOCOCCUS AUREUS (A)  Final   Report Status 10/03/2024 FINAL  Final   Organism ID, Bacteria METHICILLIN RESISTANT STAPHYLOCOCCUS AUREUS  Final      Susceptibility   Methicillin resistant staphylococcus aureus - MIC*    CIPROFLOXACIN  >=8 RESISTANT Resistant     ERYTHROMYCIN  >=8 RESISTANT Resistant     GENTAMICIN <=0.5 SENSITIVE Sensitive     OXACILLIN >=4 RESISTANT Resistant     TETRACYCLINE <=1 SENSITIVE Sensitive     VANCOMYCIN  <=0.5 SENSITIVE Sensitive     TRIMETH /SULFA  <=10 SENSITIVE Sensitive      CLINDAMYCIN >=8 RESISTANT Resistant     RIFAMPIN  <=0.5 SENSITIVE Sensitive     Inducible Clindamycin NEGATIVE Sensitive     LINEZOLID  2 SENSITIVE Sensitive     * METHICILLIN RESISTANT STAPHYLOCOCCUS AUREUS  Culture, blood (Routine X 2) w Reflex to ID Panel     Status: None (Preliminary result)   Collection Time: 10/02/24  6:36 AM   Specimen: BLOOD  Result Value Ref Range Status   Specimen Description BLOOD BLOOD LEFT ARM  Final   Special Requests   Final    BOTTLES DRAWN AEROBIC AND ANAEROBIC Blood Culture adequate volume   Culture   Final    NO GROWTH 2 DAYS Performed at [facility], [address], [place], [place] [address]    Report Status PENDING  Incomplete  Culture, blood (Routine X 2) w Reflex to ID Panel     Status: None (Preliminary result)   Collection Time: 10/02/24  6:36 AM   Specimen: BLOOD  Result Value Ref Range Status   Specimen Description BLOOD BLOOD LEFT ARM  Final   Special Requests   Final    BOTTLES DRAWN AEROBIC AND ANAEROBIC Blood Culture adequate volume   Culture   Final    NO GROWTH 2 DAYS Performed at [facility], [address] [place], [place] [address]    Report Status PENDING  Incomplete    Coagulation Studies: No results for input(s): LABPROT, INR in the last 72 hours.   Urinalysis: No results for input(s): COLORURINE, LABSPEC, PHURINE, GLUCOSEU, HGBUR, BILIRUBINUR, KETONESUR, PROTEINUR, UROBILINOGEN, NITRITE, LEUKOCYTESUR in the last 72 hours.  Invalid input(s): APPERANCEUR     Imaging: DG Chest Port 1 View Result Date: 10/03/2024 EXAM: 1 VIEW(S) XRAY OF THE CHEST 10/03/2024 04:21:00 PM COMPARISON:  09/29/2024 CLINICAL HISTORY: Oxygen desaturation FINDINGS: LINES, TUBES AND DEVICES: Right internal jugular hemodialysis catheter removed. LUNGS AND PLEURA: Prominent vasculature slightly increased interstitial markings . Stable left lung base consolidation and small loculated left pleural  effusion. Stable right basilar atelectasis. Coarsening interstitial markings. Right costophrenic angle collimated off-view No pneumothorax. HEART AND MEDIASTINUM: Stable cardiomegaly. Unchanged cardiomediastinal silhouette. BONES AND SOFT TISSUES: Thoracolumbar fusion hardware noted. No acute osseous abnormality. IMPRESSION: 1. Mild pulmonary edema. 2. Stable left lung base consolidation and small loculated left pleural effusion. Electronically signed by: Morgane Naveau MD MD 10/03/2024 07:17 PM EST RP Workstation: [id number]     Medications:    promethazine  (PHENERGAN ) injection (IM or IVPB) 6.25 mg (10/04/24 1042)    bisacodyl   10 mg Oral QHS   carvedilol   6.25 mg Oral BID WC   Chlorhexidine  Gluconate Cloth  6 each Topical Q0600   cyanocobalamin   1,000 mcg Oral Daily   diltiazem   240 mg Oral QPM   doxepin   10 mg Oral QHS   famotidine   10 mg Oral Daily   folic acid   1 mg Oral Daily   gabapentin   300 mg Oral QHS   heparin   5,000 Units Subcutaneous Q8H   insulin  aspart  0-5 Units Subcutaneous QHS   insulin  aspart  0-6 Units Subcutaneous TID WC   insulin  aspart  5 Units Subcutaneous TID WC   insulin  glargine  18 Units Subcutaneous QHS   lactulose   20 g Oral TID   lactulose   20 g Oral TID   levothyroxine   175 mcg Oral QHS   linaclotide   145 mcg Oral QAC breakfast   loratadine   10 mg Oral Daily   metoCLOPramide   5 mg Oral TID AC   nicotine   14 mg Transdermal Daily   pantoprazole   40 mg Oral Daily   polyethylene glycol  17 g Oral BID   senna-docusate  1 tablet Oral BID   simethicone   80 mg Oral QID   vancomycin  variable dose per unstable renal function (pharmacist dosing)   Does not apply See admin instructions   acetaminophen  **OR** acetaminophen , albuterol , alteplase , bisacodyl , guaiFENesin , heparin , liver oil-zinc  oxide, nicotine  polacrilex, ondansetron  **OR** ondansetron  (ZOFRAN ) IV, oxyCODONE , promethazine  (PHENERGAN ) injection (IM or IVPB), sodium phosphate   Assessment/ Plan:   [name] is a 64 y.o.  male with past medical conditions including diabetes, hepatitis C, bilateral BKA, anxiety and depression, and end-stage renal disease on hemodialysis.  Patient presents to the emergency department from his facility complaining of shortness of breath.  Patient is currently admitted for SIRS (systemic inflammatory response syndrome) (HCC) [R65.10]  [facility]/MWF/120.5 kg  End-stage renal disease on hemodialysis.        Last treatment received on 1/7, 30 min.  Due to infection, appreciate vascular removing right IJ PermCath on 1/8.  Will maintain line free holiday for at least 2 days or if patient tolerates. Maintain on renal diet to limit potassium and fluid intake. Patient on increased oxygen per patient request. Trialed on 6 Liters and was 96% overnight. Plan for perm vs temp catheter placement tomorrow, appreciate vascular consult. NPO at midnight order in place.  Anemia of chronic kidney disease Lab Results  Component Value Date   HGB 9.8 (L) 10/04/2024   Hemoglobin stable, will monitor need for ESA with dialysis.   3.  Systemic inflammatory response syndrome, suspected due to fever, leukocytosis, and tachypnea on ED arrival.  Suspected source of suprapubic catheter. Abdominal x-ray shows moderate diffuse air distention of the  bowel suspicious for ileus.  Blood culture positive for MRSA. Vascular removed PermCath on 1/8 and patient experiencing line holiday.  Continues to receive vancomycin  ordered per primary team.  Cefepime  stopped  4. Secondary Hyperparathyroidism: with outpatient labs: None available  Lab Results  Component Value Date   PTH 177 (H) 04/23/2024   CALCIUM  7.6 (L) 10/04/2024   PHOS 5.0 (H) 10/02/2024   Corrected calcium  7.8 Phosphorus within parameters. Continue to monitor bone minerals this admission.   LOS: 5 Eyal Greenhaw SHAUNNA Dines 1/11/202611:47 AM   "

## 2024-10-04 NOTE — Hospital Course (Signed)
 See if he will try bipap

## 2024-10-04 NOTE — Plan of Care (Signed)

## 2024-10-04 NOTE — Progress Notes (Signed)
 " Progress Note   Patient: Eugene [name] FMW:[id number] DOB: 04-11-1961 DOA: 09/29/2024     5 DOS: the patient was seen and examined on 10/04/2024   Brief hospital course: HPI: Primitivo Merkey is a 64 y.o. male with medical history significant of anxiety, depression, type 2 diabetes mellitus, history of hepatitis C, end-stage renal disease on dialysis, bilateral BKA.  Patient known to most historian.  States he is short of breath.  He is sick to his stomach.  He vomited.  No diarrhea.  He is short of breath.  He is having a fever.  In the ER he was found to have an elevated white blood cell count and started on sepsis protocol.  Respiratory panel pending, chest x-ray shows mild interstitial edema and left greater than right pleural effusion.  Urine looks positive and patient has a suprapubic catheter.  Hospitalist services contacted for further evaluation.   Assessment and Plan: *Sepsis secondary to MRSA bacteremia Present on admission.  Sepsis criteria fever leukocytosis and tachycardia.  Now resolved.  MRSA positive in 4 out of 4 bottles. HD catheter removed 1/8 Afebrile overnight Repeat blood cultures no growth to date Continue vancomycin  -- Continue to follow blood cultures -- Plan for placement of tunneled dialysis catheter in the a.m.  Normocytic anemia Thrombocytopenia improving Likely in settong of anemia of chronic disease and infection. H/o B12 deficiency Continue B12 supplementation, folate borderline, continue supplementation  Abdominal discomfort improving N/V  Unclear etiology.  Improved after a large bowel movement.  Possible component of gastroparesis.  CT of the abdomen pelvis with contrast notable for proctocolitis but no findings to suggest etiology of his nausea.   --Continue antiemetics, aggressive bowel regimen -- Continue trial of Reglan   ESRD (end stage renal disease) (HCC) Hemodialysis per nephrology  Paroxysmal SVT (supraventricular tachycardia) On Cardizem  and  Coreg   Heart failure with preserved ejection fraction (HCC) Dialysis to manage fluid  Acute on chronic respiratory failure with hypoxia (HCC) resolving Patient oxygen level intermittently increased for comfort, he maintained appropriate oxygen saturations on 6 L.  Patient currently refusing CPAP machine. Incentive spirometry Maintain O2 saturations greater than 92%  Asymptomatic bacteruria  Ucx with proteus mirabilis, and MRSA. MRSA due to MRSA bacteremia likely in setting of line infection.   Receiving anti MRSA antibiotics.   HTN Blood pressure remains slightly elevated. Continue to hold midodrine    Chronic bilateral pleural effusions Small.  Left greater than right.  Hd.   Chronic pain On oxycodone   Uncontrolled type 2 diabetes mellitus with hyperglycemia, with long-term current use of insulin  (HCC) A1c 7.3 though ESRD.  Remains poorly controlled.  Appears patient is on 20 units of long-acting insulin  at home plus sliding scale insulin .  Increase long-acting to 20 units and increase mealtime insulin  to 7 units 3 times daily   chronic constipation Patient on numerous medications for constipation.  Had a bowel movement this a.m. --Continue with aggressive bowel regimen   Obesity (BMI 30-39.9) BMI 37.64 Complicates care  Hypothyroidism On levothyroxine        Subjective: Patient states that he is able to tolerate meals and as he eats, his nausea improves.  He states he does sometimes require antiemetics however.  He does not correlate his nausea with any specific events.  Patient on 6 L of oxygen at home, he refuses to wear a CPAP machine.  His oxygen was increased due to patient discomfort as he had appropriate oxygen saturations on 6 L.  Physical Exam: Vitals:   10/04/24 0421  10/04/24 0812 10/04/24 0907 10/04/24 1135  BP: (!) 149/72 131/67    Pulse: 88 79    Resp: 20 16    Temp: 99.7 F (37.6 C) 98.8 F (37.1 C)    TempSrc: Oral Oral    SpO2: 100% 96% 93% 94%   Weight:      Height:         Physical Exam  Constitutional: In no distress.  Cardiovascular: Normal rate, regular rhythm. No lower extremity edema  Pulmonary: Non labored breathing on nasal cannula, no wheezing or rales.   Abdominal: Soft. Non distended and non tender Musculoskeletal: Status post bilateral BKA Neurological: Alert and oriented to person, place, and time. Non focal  Skin: Skin is warm and dry.     Data Reviewed:     Latest Ref Rng & Units 10/04/2024    8:02 AM 10/03/2024    6:28 AM 10/02/2024    6:27 AM  BMP  Glucose 70 - 99 mg/dL 722  706  796   BUN 8 - 23 mg/dL 75  72  66   Creatinine 0.61 - 1.24 mg/dL 4.60  4.84  5.26   Sodium 135 - 145 mmol/L 137  138  140   Potassium 3.5 - 5.1 mmol/L 4.0  4.3  4.0   Chloride 98 - 111 mmol/L 105  104  104   CO2 22 - 32 mmol/L 22  24  22    Calcium  8.9 - 10.3 mg/dL 7.6  7.6  7.6       Latest Ref Rng & Units 10/04/2024    8:02 AM 10/03/2024    6:28 AM 10/01/2024    1:30 PM  CBC  WBC 4.0 - 10.5 K/uL 7.9  8.5  10.8   Hemoglobin 13.0 - 17.0 g/dL 9.8  89.9  89.5   Hematocrit 39.0 - 52.0 % 30.9  31.4  32.3   Platelets 150 - 400 K/uL 102  85  93      Family Communication: None at bedside.   Disposition: Status is: Inpatient Remains inpatient appropriate because: MRSA bacteremia, IV antibiotics  Planned Discharge Destination: Pending clinical course     Time spent: 35 minutes  Author: Alban Pepper, MD 10/04/2024 4:21 PM  For on call review [email or web address].  "

## 2024-10-04 NOTE — Progress Notes (Signed)
 3 Days Post-Op   Subjective/Chief Complaint: Patient - ESRD with bacteremia s/p Permacath removal on 01/08. Has had a line holiday. Nephrology now anticipating patient will need HD in the next day or so. Request for Permacath placement.   Objective: Vital signs in last 24 hours: Temp:  [98.6 F (37 C)-99.7 F (37.6 C)] 98.8 F (37.1 C) (01/11 0812) Pulse Rate:  [79-96] 79 (01/11 0812) Resp:  [16-20] 16 (01/11 0812) BP: (123-149)/(59-72) 131/67 (01/11 0812) SpO2:  [89 %-100 %] 93 % (01/11 0907) Last BM Date : 10/03/24  Intake/Output from previous day: 01/10 0701 - 01/11 0700 In: 150 [P.O.:50; IV Piggyback:100] Out: 450 [Urine:450] Intake/Output this shift: No intake/output data recorded.  General appearance: no distress Resp: wheezes bilaterally Cardio: regular rate and rhythm  Lab Results:  Recent Labs    10/03/24 0628 10/04/24 0802  WBC 8.5 7.9  HGB 10.0* 9.8*  HCT 31.4* 30.9*  PLT 85* 102*   BMET Recent Labs    10/03/24 0628 10/04/24 0802  NA 138 137  K 4.3 4.0  CL 104 105  CO2 24 22  GLUCOSE 293* 277*  BUN 72* 75*  CREATININE 5.15* 5.39*  CALCIUM  7.6* 7.6*   PT/INR No results for input(s): LABPROT, INR in the last 72 hours. ABG No results for input(s): PHART, HCO3 in the last 72 hours.  Invalid input(s): PCO2, PO2  Studies/Results: DG Chest Port 1 View Result Date: 10/03/2024 EXAM: 1 VIEW(S) XRAY OF THE CHEST 10/03/2024 04:21:00 PM COMPARISON: 09/29/2024 CLINICAL HISTORY: Oxygen desaturation FINDINGS: LINES, TUBES AND DEVICES: Right internal jugular hemodialysis catheter removed. LUNGS AND PLEURA: Prominent vasculature slightly increased interstitial markings . Stable left lung base consolidation and small loculated left pleural effusion. Stable right basilar atelectasis. Coarsening interstitial markings. Right costophrenic angle collimated off-view No pneumothorax. HEART AND MEDIASTINUM: Stable cardiomegaly. Unchanged cardiomediastinal  silhouette. BONES AND SOFT TISSUES: Thoracolumbar fusion hardware noted. No acute osseous abnormality. IMPRESSION: 1. Mild pulmonary edema. 2. Stable left lung base consolidation and small loculated left pleural effusion. Electronically signed by: Morgane Naveau MD MD 10/03/2024 07:17 PM EST RP Workstation: [id number]   ECHOCARDIOGRAM COMPLETE Result Date: 10/02/2024    ECHOCARDIOGRAM REPORT   [name] Date of Exam: 10/02/2024 Medical Rec #:  [id number]      Height:       70.0 in Accession #:    [id number]     Weight:       272.5 lb Date of Birth:  02/01/1961      BSA:          2.381 m Patient Age:    63 years       BP:           147/68 mmHg Patient Gender: M              HR:           79 bpm. Exam Location:  [facility] Procedure: 2D Echo, Cardiac Doppler and Color Doppler (Both Spectral and Color            Flow Doppler were utilized during procedure). Indications:     Bacteremia R78.81  History:         Patient has prior history of Echocardiogram examinations, most                  recent 04/19/2024. Risk Factors:Dyslipidemia and Diabetes. End                  stage  renal disease.  Sonographer:     Christopher Furnace Referring Phys:  [id number] DONALD BERLIN Diagnosing Phys: Caron Poser  Sonographer Comments: Technically challenging study due to limited acoustic windows, no apical window and no subcostal window. Image acquisition challenging due to patient body habitus. IMPRESSIONS  1. Very technically difficult study.  2. The mitral valve was not well visualized. No evidence of mitral valve regurgitation. No evidence of mitral stenosis.  3. The aortic valve has an indeterminant number of cusps. Aortic valve regurgitation is not visualized. No aortic stenosis is present.  4. Left ventricular ejection fraction, by estimation, is 55 to 60% based on parasternal windows only. The left ventricle probably has normal function, though very poorly visualized. Left ventricular endocardial border not optimally  defined to evaluate regional wall motion. Left ventricular diastolic parameters are indeterminate.  5. Right ventricular systolic function was not well visualized. The right ventricular size is not well visualized. Comparison(s): A prior study was performed on 04/19/2024. No significant change, though unable to accurately compare due to poor image quality. FINDINGS  Left Ventricle: Left ventricular ejection fraction, by estimation, is 55 to 60%. The left ventricle has normal function. Left ventricular endocardial border not optimally defined to evaluate regional wall motion. The left ventricular internal cavity size was normal in size. Suboptimal image quality limits for assessment of left ventricular hypertrophy. Left ventricular diastolic parameters are indeterminate. Right Ventricle: The right ventricular size is not well visualized. Right vetricular wall thickness was not well visualized. Right ventricular systolic function was not well visualized. Left Atrium: Left atrial size was not well visualized. Right Atrium: Right atrial size was not well visualized. Pericardium: There is no evidence of pericardial effusion. Mitral Valve: The mitral valve was not well visualized. No evidence of mitral valve regurgitation. No evidence of mitral valve stenosis. Tricuspid Valve: The tricuspid valve is not well visualized. Tricuspid valve regurgitation is not demonstrated. No evidence of tricuspid stenosis. Aortic Valve: The aortic valve has an indeterminant number of cusps. Aortic valve regurgitation is not visualized. No aortic stenosis is present. Pulmonic Valve: The pulmonic valve was not well visualized. Pulmonic valve regurgitation is not visualized. No evidence of pulmonic stenosis. Aorta: The aortic root was not well visualized. IAS/Shunts: The interatrial septum was not well visualized.  LEFT VENTRICLE PLAX 2D LVIDd:         3.07 cm LVIDs:         1.93 cm LV PW:         2.62 cm LV IVS:        1.54 cm LVOT diam:      2.10 cm LVOT Area:     3.46 cm  LEFT ATRIUM         Index LA diam:    3.30 cm 1.39 cm/m   AORTA Ao Root diam: 3.70 cm  SHUNTS Systemic Diam: 2.10 cm Caron Poser Electronically signed by Caron Poser Signature Date/Time: 10/02/2024/2:07:14 PM    Final     Anti-infectives: Anti-infectives (From admission, onward)    Start     Dose/Rate Route Frequency Ordered Stop   10/02/24 1200  vancomycin  (VANCOCIN ) IVPB 1000 mg/200 mL premix        1,000 mg 200 mL/hr over 60 Minutes Intravenous  Once 10/02/24 1002 10/02/24 1332   09/30/24 1600  vancomycin  (VANCOCIN ) IVPB 1000 mg/200 mL premix        1,000 mg 200 mL/hr over 60 Minutes Intravenous  Once 09/30/24 1346 09/30/24 1856   09/30/24 1500  ceFEPIme  (MAXIPIME ) 2 g in sodium chloride  0.9 % 100 mL IVPB  Status:  Discontinued        2 g 200 mL/hr over 30 Minutes Intravenous Every 24 hours 09/29/24 1600 09/30/24 0541   09/29/24 1700  vancomycin  (VANCOREADY) IVPB 2000 mg/400 mL        2,000 mg 200 mL/hr over 120 Minutes Intravenous Once 09/29/24 1603 09/30/24 0036   09/29/24 1611  vancomycin  variable dose per unstable renal function (pharmacist dosing)         Does not apply See admin instructions 09/29/24 1611     09/29/24 1430  ceFEPIme  (MAXIPIME ) 2 g in sodium chloride  0.9 % 100 mL IVPB        2 g 200 mL/hr over 30 Minutes Intravenous  Once 09/29/24 1416 09/29/24 1523   09/29/24 1430  metroNIDAZOLE  (FLAGYL ) IVPB 500 mg        500 mg 100 mL/hr over 60 Minutes Intravenous  Once 09/29/24 1416 09/29/24 1641   09/29/24 1430  vancomycin  (VANCOCIN ) IVPB 1000 mg/200 mL premix  Status:  Discontinued        1,000 mg 200 mL/hr over 60 Minutes Intravenous  Once 09/29/24 1416 09/29/24 1603       Assessment/Plan: ESRD with bacteremia MRSA on cath tip and blood cultures from 01/07 and 01/08  Repeat cultures pending Permacath v temp cath tomorrow  LOS: 5 days    Tisa Dakin A 10/04/2024

## 2024-10-04 NOTE — H&P (View-Only) (Signed)
 3 Days Post-Op   Subjective/Chief Complaint: Patient - ESRD with bacteremia s/p Permacath removal on 01/08. Has had a line holiday. Nephrology now anticipating patient will need HD in the next day or so. Request for Permacath placement.   Objective: Vital signs in last 24 hours: Temp:  [98.6 F (37 C)-99.7 F (37.6 C)] 98.8 F (37.1 C) (01/11 0812) Pulse Rate:  [79-96] 79 (01/11 0812) Resp:  [16-20] 16 (01/11 0812) BP: (123-149)/(59-72) 131/67 (01/11 0812) SpO2:  [89 %-100 %] 93 % (01/11 0907) Last BM Date : 10/03/24  Intake/Output from previous day: 01/10 0701 - 01/11 0700 In: 150 [P.O.:50; IV Piggyback:100] Out: 450 [Urine:450] Intake/Output this shift: No intake/output data recorded.  General appearance: no distress Resp: wheezes bilaterally Cardio: regular rate and rhythm  Lab Results:  Recent Labs    10/03/24 0628 10/04/24 0802  WBC 8.5 7.9  HGB 10.0* 9.8*  HCT 31.4* 30.9*  PLT 85* 102*   BMET Recent Labs    10/03/24 0628 10/04/24 0802  NA 138 137  K 4.3 4.0  CL 104 105  CO2 24 22  GLUCOSE 293* 277*  BUN 72* 75*  CREATININE 5.15* 5.39*  CALCIUM  7.6* 7.6*   PT/INR No results for input(s): LABPROT, INR in the last 72 hours. ABG No results for input(s): PHART, HCO3 in the last 72 hours.  Invalid input(s): PCO2, PO2  Studies/Results: DG Chest Port 1 View Result Date: 10/03/2024 EXAM: 1 VIEW(S) XRAY OF THE CHEST 10/03/2024 04:21:00 PM COMPARISON: 09/29/2024 CLINICAL HISTORY: Oxygen desaturation FINDINGS: LINES, TUBES AND DEVICES: Right internal jugular hemodialysis catheter removed. LUNGS AND PLEURA: Prominent vasculature slightly increased interstitial markings . Stable left lung base consolidation and small loculated left pleural effusion. Stable right basilar atelectasis. Coarsening interstitial markings. Right costophrenic angle collimated off-view No pneumothorax. HEART AND MEDIASTINUM: Stable cardiomegaly. Unchanged cardiomediastinal  silhouette. BONES AND SOFT TISSUES: Thoracolumbar fusion hardware noted. No acute osseous abnormality. IMPRESSION: 1. Mild pulmonary edema. 2. Stable left lung base consolidation and small loculated left pleural effusion. Electronically signed by: Morgane Naveau MD MD 10/03/2024 07:17 PM EST RP Workstation: [id number]   ECHOCARDIOGRAM COMPLETE Result Date: 10/02/2024    ECHOCARDIOGRAM REPORT   [name] Date of Exam: 10/02/2024 Medical Rec #:  [id number]      Height:       70.0 in Accession #:    [id number]     Weight:       272.5 lb Date of Birth:  02/01/1961      BSA:          2.381 m Patient Age:    63 years       BP:           147/68 mmHg Patient Gender: M              HR:           79 bpm. Exam Location:  [facility] Procedure: 2D Echo, Cardiac Doppler and Color Doppler (Both Spectral and Color            Flow Doppler were utilized during procedure). Indications:     Bacteremia R78.81  History:         Patient has prior history of Echocardiogram examinations, most                  recent 04/19/2024. Risk Factors:Dyslipidemia and Diabetes. End                  stage  renal disease.  Sonographer:     Christopher Furnace Referring Phys:  [id number] DONALD BERLIN Diagnosing Phys: Caron Poser  Sonographer Comments: Technically challenging study due to limited acoustic windows, no apical window and no subcostal window. Image acquisition challenging due to patient body habitus. IMPRESSIONS  1. Very technically difficult study.  2. The mitral valve was not well visualized. No evidence of mitral valve regurgitation. No evidence of mitral stenosis.  3. The aortic valve has an indeterminant number of cusps. Aortic valve regurgitation is not visualized. No aortic stenosis is present.  4. Left ventricular ejection fraction, by estimation, is 55 to 60% based on parasternal windows only. The left ventricle probably has normal function, though very poorly visualized. Left ventricular endocardial border not optimally  defined to evaluate regional wall motion. Left ventricular diastolic parameters are indeterminate.  5. Right ventricular systolic function was not well visualized. The right ventricular size is not well visualized. Comparison(s): A prior study was performed on 04/19/2024. No significant change, though unable to accurately compare due to poor image quality. FINDINGS  Left Ventricle: Left ventricular ejection fraction, by estimation, is 55 to 60%. The left ventricle has normal function. Left ventricular endocardial border not optimally defined to evaluate regional wall motion. The left ventricular internal cavity size was normal in size. Suboptimal image quality limits for assessment of left ventricular hypertrophy. Left ventricular diastolic parameters are indeterminate. Right Ventricle: The right ventricular size is not well visualized. Right vetricular wall thickness was not well visualized. Right ventricular systolic function was not well visualized. Left Atrium: Left atrial size was not well visualized. Right Atrium: Right atrial size was not well visualized. Pericardium: There is no evidence of pericardial effusion. Mitral Valve: The mitral valve was not well visualized. No evidence of mitral valve regurgitation. No evidence of mitral valve stenosis. Tricuspid Valve: The tricuspid valve is not well visualized. Tricuspid valve regurgitation is not demonstrated. No evidence of tricuspid stenosis. Aortic Valve: The aortic valve has an indeterminant number of cusps. Aortic valve regurgitation is not visualized. No aortic stenosis is present. Pulmonic Valve: The pulmonic valve was not well visualized. Pulmonic valve regurgitation is not visualized. No evidence of pulmonic stenosis. Aorta: The aortic root was not well visualized. IAS/Shunts: The interatrial septum was not well visualized.  LEFT VENTRICLE PLAX 2D LVIDd:         3.07 cm LVIDs:         1.93 cm LV PW:         2.62 cm LV IVS:        1.54 cm LVOT diam:      2.10 cm LVOT Area:     3.46 cm  LEFT ATRIUM         Index LA diam:    3.30 cm 1.39 cm/m   AORTA Ao Root diam: 3.70 cm  SHUNTS Systemic Diam: 2.10 cm Caron Poser Electronically signed by Caron Poser Signature Date/Time: 10/02/2024/2:07:14 PM    Final     Anti-infectives: Anti-infectives (From admission, onward)    Start     Dose/Rate Route Frequency Ordered Stop   10/02/24 1200  vancomycin  (VANCOCIN ) IVPB 1000 mg/200 mL premix        1,000 mg 200 mL/hr over 60 Minutes Intravenous  Once 10/02/24 1002 10/02/24 1332   09/30/24 1600  vancomycin  (VANCOCIN ) IVPB 1000 mg/200 mL premix        1,000 mg 200 mL/hr over 60 Minutes Intravenous  Once 09/30/24 1346 09/30/24 1856   09/30/24 1500  ceFEPIme  (MAXIPIME ) 2 g in sodium chloride  0.9 % 100 mL IVPB  Status:  Discontinued        2 g 200 mL/hr over 30 Minutes Intravenous Every 24 hours 09/29/24 1600 09/30/24 0541   09/29/24 1700  vancomycin  (VANCOREADY) IVPB 2000 mg/400 mL        2,000 mg 200 mL/hr over 120 Minutes Intravenous Once 09/29/24 1603 09/30/24 0036   09/29/24 1611  vancomycin  variable dose per unstable renal function (pharmacist dosing)         Does not apply See admin instructions 09/29/24 1611     09/29/24 1430  ceFEPIme  (MAXIPIME ) 2 g in sodium chloride  0.9 % 100 mL IVPB        2 g 200 mL/hr over 30 Minutes Intravenous  Once 09/29/24 1416 09/29/24 1523   09/29/24 1430  metroNIDAZOLE  (FLAGYL ) IVPB 500 mg        500 mg 100 mL/hr over 60 Minutes Intravenous  Once 09/29/24 1416 09/29/24 1641   09/29/24 1430  vancomycin  (VANCOCIN ) IVPB 1000 mg/200 mL premix  Status:  Discontinued        1,000 mg 200 mL/hr over 60 Minutes Intravenous  Once 09/29/24 1416 09/29/24 1603       Assessment/Plan: ESRD with bacteremia MRSA on cath tip and blood cultures from 01/07 and 01/08  Repeat cultures pending Permacath v temp cath tomorrow  LOS: 5 days    Tisa Dakin A 10/04/2024

## 2024-10-05 ENCOUNTER — Encounter
Admission: EM | Disposition: A | Discharge status: Skilled Nursing Facility | Payer: Self-pay | Source: Skilled Nursing Facility | Attending: Student

## 2024-10-05 ENCOUNTER — Encounter: Payer: Self-pay | Admitting: Vascular Surgery

## 2024-10-05 DIAGNOSIS — T827XXA Infection and inflammatory reaction due to other cardiac and vascular devices, implants and grafts, initial encounter: Secondary | ICD-10-CM | POA: Diagnosis not present

## 2024-10-05 DIAGNOSIS — I871 Compression of vein: Secondary | ICD-10-CM | POA: Diagnosis not present

## 2024-10-05 DIAGNOSIS — N186 End stage renal disease: Secondary | ICD-10-CM | POA: Diagnosis not present

## 2024-10-05 DIAGNOSIS — Z992 Dependence on renal dialysis: Secondary | ICD-10-CM | POA: Diagnosis not present

## 2024-10-05 DIAGNOSIS — B9562 Methicillin resistant Staphylococcus aureus infection as the cause of diseases classified elsewhere: Secondary | ICD-10-CM | POA: Diagnosis not present

## 2024-10-05 DIAGNOSIS — B1920 Unspecified viral hepatitis C without hepatic coma: Secondary | ICD-10-CM | POA: Diagnosis not present

## 2024-10-05 DIAGNOSIS — R7881 Bacteremia: Secondary | ICD-10-CM | POA: Diagnosis not present

## 2024-10-05 DIAGNOSIS — K5900 Constipation, unspecified: Secondary | ICD-10-CM | POA: Diagnosis not present

## 2024-10-05 DIAGNOSIS — N319 Neuromuscular dysfunction of bladder, unspecified: Secondary | ICD-10-CM | POA: Diagnosis not present

## 2024-10-05 DIAGNOSIS — E039 Hypothyroidism, unspecified: Secondary | ICD-10-CM | POA: Diagnosis not present

## 2024-10-05 DIAGNOSIS — E1122 Type 2 diabetes mellitus with diabetic chronic kidney disease: Secondary | ICD-10-CM | POA: Diagnosis not present

## 2024-10-05 HISTORY — PX: DIALYSIS/PERMA CATHETER INSERTION: CATH118288

## 2024-10-05 LAB — BASIC METABOLIC PANEL WITH GFR
Anion gap: 10 (ref 5–15)
BUN: 57 mg/dL — ABNORMAL HIGH (ref 8–23)
CO2: 24 mmol/L (ref 22–32)
Calcium: 8 mg/dL — ABNORMAL LOW (ref 8.9–10.3)
Chloride: 103 mmol/L (ref 98–111)
Creatinine, Ser: 3.89 mg/dL — ABNORMAL HIGH (ref 0.61–1.24)
GFR, Estimated: 17 mL/min — ABNORMAL LOW
Glucose, Bld: 255 mg/dL — ABNORMAL HIGH (ref 70–99)
Potassium: 4.2 mmol/L (ref 3.5–5.1)
Sodium: 137 mmol/L (ref 135–145)

## 2024-10-05 LAB — GLUCOSE, CAPILLARY
Glucose-Capillary: 292 mg/dL — ABNORMAL HIGH (ref 70–99)
Glucose-Capillary: 300 mg/dL — ABNORMAL HIGH (ref 70–99)
Glucose-Capillary: 318 mg/dL — ABNORMAL HIGH (ref 70–99)
Glucose-Capillary: 207 mg/dL — ABNORMAL HIGH (ref 70–99)
Glucose-Capillary: 197 mg/dL — ABNORMAL HIGH (ref 70–99)
Glucose-Capillary: 218 mg/dL — ABNORMAL HIGH (ref 70–99)

## 2024-10-05 LAB — VANCOMYCIN, RANDOM: Vancomycin Rm: 12 ug/mL

## 2024-10-05 MED ORDER — LIDOCAINE-EPINEPHRINE (PF) 1 %-1:200000 IJ SOLN
INTRAMUSCULAR | Status: DC | PRN
  Administered 2024-10-05: 20 mL

## 2024-10-05 MED ORDER — FENTANYL CITRATE (PF) 50 MCG/ML IJ SOSY
PREFILLED_SYRINGE | INTRAMUSCULAR | Status: AC
  Filled 2024-10-05: qty 1

## 2024-10-05 MED ORDER — HEPARIN SODIUM (PORCINE) 10000 UNIT/ML IJ SOLN
INTRAMUSCULAR | Status: AC
  Filled 2024-10-05: qty 1

## 2024-10-05 MED ORDER — HEPARIN (PORCINE) IN NACL 1000-0.9 UT/500ML-% IV SOLN
INTRAVENOUS | Status: DC | PRN
  Administered 2024-10-05: 500 mL

## 2024-10-05 MED ORDER — HEPARIN SODIUM (PORCINE) 1000 UNIT/ML IJ SOLN
INTRAMUSCULAR | Status: AC
  Filled 2024-10-05: qty 4

## 2024-10-05 MED ORDER — MIDAZOLAM HCL 2 MG/2ML IJ SOLN
INTRAMUSCULAR | Status: AC
  Filled 2024-10-05: qty 2

## 2024-10-05 MED ORDER — SODIUM CHLORIDE 0.9 % IV SOLN: INTRAVENOUS | Status: DC

## 2024-10-05 MED ORDER — MIDAZOLAM HCL (PF) 2 MG/2ML IJ SOLN
INTRAMUSCULAR | Status: DC | PRN
  Administered 2024-10-05 (×2): 1 mg via INTRAVENOUS

## 2024-10-05 MED ORDER — IODIXANOL 320 MG/ML IV SOLN
INTRAVENOUS | Status: DC | PRN
  Administered 2024-10-05: 5 mL

## 2024-10-05 MED ORDER — INSULIN GLARGINE 100 UNIT/ML ~~LOC~~ SOLN
20.0000 [IU] | Freq: Two times a day (BID) | SUBCUTANEOUS | Status: DC
  Administered 2024-10-05 – 2024-10-06 (×2): 20 [IU] via SUBCUTANEOUS
  Filled 2024-10-05 (×3): qty 0.2

## 2024-10-05 MED ORDER — ONDANSETRON HCL 4 MG/2ML IJ SOLN
INTRAMUSCULAR | Status: AC
  Filled 2024-10-05: qty 2

## 2024-10-05 MED ORDER — VANCOMYCIN HCL 1750 MG/350ML IV SOLN
1750.0000 mg | Freq: Once | INTRAVENOUS | Status: AC
  Administered 2024-10-05: 1750 mg via INTRAVENOUS
  Filled 2024-10-05: qty 350

## 2024-10-05 NOTE — Plan of Care (Signed)

## 2024-10-05 NOTE — Consult Note (Addendum)
 Pharmacy Antibiotic Note  Eugene [name] is a 64 y.o. male with a PMH of ESRD on HD MWF, HTN, DM, HLD and anxiety is admitted on 09/29/2024 with sepsis  and SOB. Pharmacy has been consulted for vancomycin   dosing.  Today, 10/05/2024 Day #6 vancomycin  Renal: ESRD on HD - MWF HD catheter removed 1/8 for line holiday New line placed 1/12 WBC WNL Afebrile 1/6 blood cx: 4/4 GPC, BCID MRSA 1/9 repeat blood cx: NGTD 1/8 cath tip. S. aureus Urine cx: >100K P mirabilis, MRSA  Vancomycin  dosing and levels:  Vancomycin  2gm IV x 1 1/6 at 2236. Per HD documentation received about 2h of HD on 1/7.  Random vancomycin  level 1/9 = 18 mcg/mL, vancomycin  1gm x 1 given 1/9 at 12:12 1/12 1410 random vancomycin  level = 12 mcg/ml.  No doses give since 1/9.  Level not drawn with am labs as ordered  Plan: Random vancomycin  today is 12 mcg/ml prior or just with the start of HD.  Anticipate will clear another ~30% if completes full HD session,  taking that into consideration (anticipate post-HD level to be ~8 mcg/mL),  redose vancomycin  1750mg  IV x 1 this evening after HD. Will follow-up HD schedule and start schedule vancomycin .  Since looks to have residual renal function based on vancomycin  levels and urine output.  Will consider empiricially starting with vancomycin  1250mg  IV qHD.   Planning for TEE  Height: 5' 10 (177.8 cm) Weight: 124.6 kg (274 lb 11.1 oz) IBW/kg (Calculated) : 73  Temp (24hrs), Avg:98.3 F (36.8 C), Min:97 F (36.1 C), Max:99.1 F (37.3 C)  Recent Labs  Lab 09/29/24 1437 09/29/24 2006 09/30/24 0425 09/30/24 0727 10/01/24 1330 10/02/24 0627 10/03/24 0628 10/04/24 0802 10/05/24 1410  WBC 26.4*  --  16.8*  --  10.8*  --  8.5 7.9  --   CREATININE 4.37*  --   --    < > 4.47* 4.73* 5.15* 5.39* 3.89*  LATICACIDVEN 1.7 1.5  --   --   --   --   --   --   --   VANCORANDOM  --   --   --   --   --  18  --   --  12   < > = values in this interval not displayed.    Estimated  Creatinine Clearance: 26.4 mL/min (A) (by C-G formula based on SCr of 3.89 mg/dL (H)).    Allergies[1]  Antimicrobials this admission: 1/6 cefepime  >> 1/6 1/6 vancomycin  >>   Thank you for allowing pharmacy to be a part of this patients care.  Chyan Carnero, PharmD, BCPS, BCIDP Work Cell: [phone] 10/05/2024 5:14 PM           [1] No Known Allergies

## 2024-10-05 NOTE — Progress Notes (Signed)
 " [facility]  ROUNDING NOTE   Subjective:   Eugene [name] is a 64 year old male with past medical conditions including diabetes, hepatitis C, bilateral BKA, anxiety and depression, and end-stage renal disease on hemodialysis.  Patient presents to the emergency department from his facility complaining of shortness of breath.  Patient is currently admitted for SIRS (systemic inflammatory response syndrome) (HCC) [R65.10]  Patient is known to our practice and receives outpatient dialysis treatments at [facility] on a MWF schedule, supervised by Dr. Dennise.    Update:  Patient seen and evaluated during dialysis   HEMODIALYSIS FLOWSHEET:  Blood Flow Rate (mL/min): 199 mL/min Arterial Pressure (mmHg): -84.04 mmHg Venous Pressure (mmHg): 92.52 mmHg TMP (mmHg): 15.35 mmHg Ultrafiltration Rate (mL/min): 0 mL/min Dialysate Flow Rate (mL/min): 300 ml/min  Main complaint involves lack of food Will attempt dialysis with new Permcath   Objective:  Vital signs in last 24 hours:  Temp:  [97 F (36.1 C)-99.2 F (37.3 C)] 98.1 F (36.7 C) (01/12 1306) Pulse Rate:  [73-93] 77 (01/12 1306) Resp:  [9-24] 16 (01/12 1118) BP: (127-166)/(53-90) 143/70 (01/12 1306) SpO2:  [78 %-97 %] 95 % (01/12 1306) Weight:  [123.6 kg-124.6 kg] 124.6 kg (01/12 1306)  Weight change:  Filed Weights   09/30/24 1043 10/05/24 0834 10/05/24 1306  Weight: 123.6 kg 123.6 kg 124.6 kg    Intake/Output: I/O last 3 completed shifts: In: 320.2 [P.O.:200; IV Piggyback:120.2] Out: 1150 [Urine:1150]   Intake/Output this shift:  No intake/output data recorded.  Physical Exam: General: NAD  Head: Normocephalic  Eyes: Anicteric  Lungs:  Wheeze, Pella 10 L HFNC  Heart: Regular rate and rhythm  Abdomen:  Soft, nontender  Extremities: Trace edema.  Bilateral BKA  Neurologic: Awake, alert, conversant  Skin: Warm,dry, no rash  Access: Lt internal jugular permcath    Basic Metabolic Panel: Recent  Labs  Lab 09/30/24 0727 10/01/24 1330 10/02/24 0627 10/03/24 0628 10/04/24 0802  NA 136 138 140 138 137  K 5.1 4.3 4.0 4.3 4.0  CL 101 102 104 104 105  CO2 22 23 22 24 22   GLUCOSE 398* 261* 203* 293* 277*  BUN 70* 65* 66* 72* 75*  CREATININE 4.81* 4.47* 4.73* 5.15* 5.39*  CALCIUM  8.2* 8.1* 7.6* 7.6* 7.6*  MG  --   --   --   --  1.9  PHOS  --   --  5.0*  --   --     Liver Function Tests: Recent Labs  Lab 09/29/24 1437  AST 21  ALT 28  ALKPHOS 83  BILITOT 0.4  PROT 6.5  ALBUMIN  3.2*   Recent Labs  Lab 09/30/24 0727  LIPASE <10*   No results for input(s): AMMONIA in the last 168 hours.  CBC: Recent Labs  Lab 09/29/24 1437 09/30/24 0425 10/01/24 1330 10/03/24 0628 10/04/24 0802  WBC 26.4* 16.8* 10.8* 8.5 7.9  NEUTROABS 23.2*  --  9.3*  --   --   HGB 11.5* 9.9* 10.4* 10.0* 9.8*  HCT 36.1* 32.7* 32.3* 31.4* 30.9*  MCV 94.0 97.0 93.1 94.0 94.5  PLT 139* 118* 93* 85* 102*    Cardiac Enzymes: No results for input(s): CKTOTAL, CKMB, CKMBINDEX, TROPONINI in the last 168 hours.  BNP: Invalid input(s): POCBNP  CBG: Recent Labs  Lab 10/04/24 1658 10/04/24 2101 10/05/24 0805 10/05/24 1032 10/05/24 1117  GLUCAP 241* 281* 292* 300* 318*    Microbiology: Results for orders placed or performed during the hospital encounter of 09/29/24  Resp  panel by RT-PCR (RSV, Flu A&B, Covid) Anterior Nasal Swab     Status: None   Collection Time: 09/29/24  2:16 PM   Specimen: Anterior Nasal Swab  Result Value Ref Range Status   SARS Coronavirus 2 by RT PCR NEGATIVE NEGATIVE Final    Comment: (NOTE) SARS-CoV-2 target nucleic acids are NOT DETECTED.  The SARS-CoV-2 RNA is generally detectable in upper respiratory specimens during the acute phase of infection. The lowest concentration of SARS-CoV-2 viral copies this assay can detect is 138 copies/mL. A negative result does not preclude SARS-Cov-2 infection and should not be used as the sole basis for  treatment or other patient management decisions. A negative result may occur with  improper specimen collection/handling, submission of specimen other than nasopharyngeal swab, presence of viral mutation(s) within the areas targeted by this assay, and inadequate number of viral copies(<138 copies/mL). A negative result must be combined with clinical observations, patient history, and epidemiological information. The expected result is Negative.  Fact Sheet for Patients:  bloggercourse.com  Fact Sheet for Healthcare Providers:  seriousbroker.it  This test is no t yet approved or cleared by the [place]  FDA and  has been authorized for detection and/or diagnosis of SARS-CoV-2 by FDA under an Emergency Use Authorization (EUA). This EUA will remain  in effect (meaning this test can be used) for the duration of the COVID-19 declaration under Section 564(b)(1) of the Act, 21 U.S.C.section 360bbb-3(b)(1), unless the authorization is terminated  or revoked sooner.       Influenza A by PCR NEGATIVE NEGATIVE Final   Influenza B by PCR NEGATIVE NEGATIVE Final    Comment: (NOTE) The Xpert Xpress SARS-CoV-2/FLU/RSV plus assay is intended as an aid in the diagnosis of influenza from Nasopharyngeal swab specimens and should not be used as a sole basis for treatment. Nasal washings and aspirates are unacceptable for Xpert Xpress SARS-CoV-2/FLU/RSV testing.  Fact Sheet for Patients: bloggercourse.com  Fact Sheet for Healthcare Providers: seriousbroker.it  This test is not yet approved or cleared by the [place]  FDA and has been authorized for detection and/or diagnosis of SARS-CoV-2 by FDA under an Emergency Use Authorization (EUA). This EUA will remain in effect (meaning this test can be used) for the duration of the COVID-19 declaration under Section 564(b)(1) of the Act, 21  U.S.C. section 360bbb-3(b)(1), unless the authorization is terminated or revoked.     Resp Syncytial Virus by PCR NEGATIVE NEGATIVE Final    Comment: (NOTE) Fact Sheet for Patients: bloggercourse.com  Fact Sheet for Healthcare Providers: seriousbroker.it  This test is not yet approved or cleared by the [place]  FDA and has been authorized for detection and/or diagnosis of SARS-CoV-2 by FDA under an Emergency Use Authorization (EUA). This EUA will remain in effect (meaning this test can be used) for the duration of the COVID-19 declaration under Section 564(b)(1) of the Act, 21 U.S.C. section 360bbb-3(b)(1), unless the authorization is terminated or revoked.  Performed at [facility], [address], [place], [place] [address]   Urine Culture     Status: Abnormal   Collection Time: 09/29/24  2:17 PM   Specimen: Urine, Random  Result Value Ref Range Status   Specimen Description   Final    URINE, RANDOM Performed at [facility], [address], [place], [place] [address]    Special Requests   Final    NONE Reflexed from [id number] Performed at [facility], [address] [place] [place], [place] [address]  Culture (A)  Final    >=100,000 COLONIES/mL PROTEUS MIRABILIS >=100,000 COLONIES/mL METHICILLIN RESISTANT STAPHYLOCOCCUS AUREUS    Report Status 10/02/2024 FINAL  Final   Organism ID, Bacteria PROTEUS MIRABILIS (A)  Final   Organism ID, Bacteria METHICILLIN RESISTANT STAPHYLOCOCCUS AUREUS (A)  Final      Susceptibility   Methicillin resistant staphylococcus aureus - MIC*    CIPROFLOXACIN  >=8 RESISTANT Resistant     GENTAMICIN <=0.5 SENSITIVE Sensitive     NITROFURANTOIN <=16 SENSITIVE Sensitive     OXACILLIN >=4 RESISTANT Resistant     TETRACYCLINE <=1 SENSITIVE Sensitive     VANCOMYCIN  1 SENSITIVE Sensitive     TRIMETH /SULFA  <=10 SENSITIVE Sensitive     RIFAMPIN  <=0.5 SENSITIVE Sensitive      Inducible Clindamycin NEGATIVE Sensitive     LINEZOLID  2 SENSITIVE Sensitive     * >=100,000 COLONIES/mL METHICILLIN RESISTANT STAPHYLOCOCCUS AUREUS   Proteus mirabilis - MIC*    AMPICILLIN <=2 SENSITIVE Sensitive     CEFAZOLIN  (URINE) Value in next row Sensitive      4 SENSITIVEThis is a modified FDA-approved test that has been validated and its performance characteristics determined by the reporting laboratory.  This laboratory is certified under the Clinical Laboratory Improvement Amendments CLIA as qualified to perform high complexity clinical laboratory testing.    CEFEPIME  Value in next row Sensitive      4 SENSITIVEThis is a modified FDA-approved test that has been validated and its performance characteristics determined by the reporting laboratory.  This laboratory is certified under the Clinical Laboratory Improvement Amendments CLIA as qualified to perform high complexity clinical laboratory testing.    ERTAPENEM Value in next row Sensitive      4 SENSITIVEThis is a modified FDA-approved test that has been validated and its performance characteristics determined by the reporting laboratory.  This laboratory is certified under the Clinical Laboratory Improvement Amendments CLIA as qualified to perform high complexity clinical laboratory testing.    CEFTRIAXONE  Value in next row Sensitive      4 SENSITIVEThis is a modified FDA-approved test that has been validated and its performance characteristics determined by the reporting laboratory.  This laboratory is certified under the Clinical Laboratory Improvement Amendments CLIA as qualified to perform high complexity clinical laboratory testing.    CIPROFLOXACIN  Value in next row Resistant      4 SENSITIVEThis is a modified FDA-approved test that has been validated and its performance characteristics determined by the reporting laboratory.  This laboratory is certified under the Clinical Laboratory Improvement Amendments CLIA as qualified to  perform high complexity clinical laboratory testing.    GENTAMICIN Value in next row Sensitive      4 SENSITIVEThis is a modified FDA-approved test that has been validated and its performance characteristics determined by the reporting laboratory.  This laboratory is certified under the Clinical Laboratory Improvement Amendments CLIA as qualified to perform high complexity clinical laboratory testing.    NITROFURANTOIN Value in next row Resistant      4 SENSITIVEThis is a modified FDA-approved test that has been validated and its performance characteristics determined by the reporting laboratory.  This laboratory is certified under the Clinical Laboratory Improvement Amendments CLIA as qualified to perform high complexity clinical laboratory testing.    TRIMETH /SULFA  Value in next row Sensitive      4 SENSITIVEThis is a modified FDA-approved test that has been validated and its performance characteristics determined by the reporting laboratory.  This laboratory is certified under the Clinical Laboratory Improvement Amendments CLIA  as qualified to perform high complexity clinical laboratory testing.    AMPICILLIN/SULBACTAM Value in next row Sensitive      4 SENSITIVEThis is a modified FDA-approved test that has been validated and its performance characteristics determined by the reporting laboratory.  This laboratory is certified under the Clinical Laboratory Improvement Amendments CLIA as qualified to perform high complexity clinical laboratory testing.    PIP/TAZO Value in next row Sensitive      <=4 SENSITIVEThis is a modified FDA-approved test that has been validated and its performance characteristics determined by the reporting laboratory.  This laboratory is certified under the Clinical Laboratory Improvement Amendments CLIA as qualified to perform high complexity clinical laboratory testing.    MEROPENEM Value in next row Sensitive      <=4 SENSITIVEThis is a modified FDA-approved test that has  been validated and its performance characteristics determined by the reporting laboratory.  This laboratory is certified under the Clinical Laboratory Improvement Amendments CLIA as qualified to perform high complexity clinical laboratory testing.    * >=100,000 COLONIES/mL PROTEUS MIRABILIS  Blood Culture (routine x 2)     Status: Abnormal   Collection Time: 09/29/24  2:37 PM   Specimen: BLOOD  Result Value Ref Range Status   Specimen Description   Final    BLOOD BLOOD RIGHT FOREARM Performed at [facility], [address] [place], [place] [address]    Special Requests   Final    BOTTLES DRAWN AEROBIC AND ANAEROBIC Blood Culture results may not be optimal due to an inadequate volume of blood received in culture bottles Performed at [facility], [address] [place], [place] [address]    Culture  Setup Time   Final    GRAM POSITIVE COCCI IN BOTH AEROBIC AND ANAEROBIC BOTTLES CRITICAL VALUE NOTED.  VALUE IS CONSISTENT WITH PREVIOUSLY REPORTED AND CALLED VALUE.    Culture (A)  Final    STAPHYLOCOCCUS AUREUS SUSCEPTIBILITIES PERFORMED ON PREVIOUS CULTURE WITHIN THE LAST 5 DAYS. Performed at [facility], [address], [place], [place] [address]    Report Status 10/02/2024 FINAL  Final  Blood Culture (routine x 2)     Status: Abnormal (Preliminary result)   Collection Time: 09/29/24  2:37 PM   Specimen: BLOOD  Result Value Ref Range Status   Specimen Description   Final    BLOOD LEFT ANTECUBITAL Performed at [facility], [address] [place], [place] [address]    Special Requests   Final    BOTTLES DRAWN AEROBIC AND ANAEROBIC Blood Culture results may not be optimal due to an inadequate volume of blood received in culture bottles Performed at [facility], [address] [place], [place] [address]    Culture  Setup Time   Final    GRAM POSITIVE COCCI IN BOTH AEROBIC AND ANAEROBIC BOTTLES CRITICAL RESULT CALLED TO, READ  BACK BY AND VERIFIED WITH: PHARMD NATHAN B ON [id number] @0457  BY HNM    Culture (A)  Final    METHICILLIN RESISTANT STAPHYLOCOCCUS AUREUS Sent to [facility] for further susceptibility testing. Performed at [facility], [address], [place], [place] [address]    Report Status PENDING  Incomplete   Organism ID, Bacteria METHICILLIN RESISTANT STAPHYLOCOCCUS AUREUS  Final      Susceptibility   Methicillin resistant staphylococcus aureus - MIC*    CIPROFLOXACIN  >=8 RESISTANT Resistant     ERYTHROMYCIN  >=8 RESISTANT Resistant     GENTAMICIN <=0.5 SENSITIVE Sensitive  OXACILLIN >=4 RESISTANT Resistant     TETRACYCLINE <=1 SENSITIVE Sensitive     VANCOMYCIN  1 SENSITIVE Sensitive     TRIMETH /SULFA  <=10 SENSITIVE Sensitive     CLINDAMYCIN >=8 RESISTANT Resistant     RIFAMPIN  <=0.5 SENSITIVE Sensitive     Inducible Clindamycin NEGATIVE Sensitive     LINEZOLID  2 SENSITIVE Sensitive     * METHICILLIN RESISTANT STAPHYLOCOCCUS AUREUS  Blood Culture ID Panel (Reflexed)     Status: Abnormal   Collection Time: 09/29/24  2:37 PM  Result Value Ref Range Status   Enterococcus faecalis NOT DETECTED NOT DETECTED Final   Enterococcus Faecium NOT DETECTED NOT DETECTED Final   Listeria monocytogenes NOT DETECTED NOT DETECTED Final   Staphylococcus species DETECTED (A) NOT DETECTED Final    Comment: CRITICAL RESULT CALLED TO, READ BACK BY AND VERIFIED WITHBETHA RANKIN DILLS PHARMD [id number] 09/30/24 HNM    Staphylococcus aureus (BCID) DETECTED (A) NOT DETECTED Final    Comment: Methicillin (oxacillin)-resistant Staphylococcus aureus (MRSA). MRSA is predictably resistant to beta-lactam antibiotics (except ceftaroline). Preferred therapy is vancomycin  unless clinically contraindicated. Patient requires contact precautions if  hospitalized. CRITICAL RESULT CALLED TO, READ BACK BY AND VERIFIED WITH: RANKIN DILLS PHARMD [id number] 09/30/24 HNM    Staphylococcus epidermidis NOT DETECTED NOT DETECTED Final    Staphylococcus lugdunensis NOT DETECTED NOT DETECTED Final   Streptococcus species NOT DETECTED NOT DETECTED Final   Streptococcus agalactiae NOT DETECTED NOT DETECTED Final   Streptococcus pneumoniae NOT DETECTED NOT DETECTED Final   Streptococcus pyogenes NOT DETECTED NOT DETECTED Final   A.calcoaceticus-baumannii NOT DETECTED NOT DETECTED Final   Bacteroides fragilis NOT DETECTED NOT DETECTED Final   Enterobacterales NOT DETECTED NOT DETECTED Final   Enterobacter cloacae complex NOT DETECTED NOT DETECTED Final   Escherichia coli NOT DETECTED NOT DETECTED Final   Klebsiella aerogenes NOT DETECTED NOT DETECTED Final   Klebsiella oxytoca NOT DETECTED NOT DETECTED Final   Klebsiella pneumoniae NOT DETECTED NOT DETECTED Final   Proteus species NOT DETECTED NOT DETECTED Final   Salmonella species NOT DETECTED NOT DETECTED Final   Serratia marcescens NOT DETECTED NOT DETECTED Final   Haemophilus influenzae NOT DETECTED NOT DETECTED Final   Neisseria meningitidis NOT DETECTED NOT DETECTED Final   Pseudomonas aeruginosa NOT DETECTED NOT DETECTED Final   Stenotrophomonas maltophilia NOT DETECTED NOT DETECTED Final   Candida albicans NOT DETECTED NOT DETECTED Final   Candida auris NOT DETECTED NOT DETECTED Final   Candida glabrata NOT DETECTED NOT DETECTED Final   Candida krusei NOT DETECTED NOT DETECTED Final   Candida parapsilosis NOT DETECTED NOT DETECTED Final   Candida tropicalis NOT DETECTED NOT DETECTED Final   Cryptococcus neoformans/gattii NOT DETECTED NOT DETECTED Final   Meth resistant mecA/C and MREJ DETECTED (A) NOT DETECTED Final    Comment: CRITICAL RESULT CALLED TO, READ BACK BY AND VERIFIED WITHBETHA RANKIN DILLS PHARMD [id number] 09/30/24 HNM Performed at [facility], [address] [place], [place] [address]   MIC (1 Drug)-     Status: Abnormal (Preliminary result)   Collection Time: 09/29/24  2:37 PM  Result Value Ref Range Status   Min Inhibitory Conc (1 Drug)  Preliminary report (A)  Final    Comment: (NOTE) Performed At: [facility] [employer] [address] [place], [place] [id number] Jennette Shorter MD Ey:[id number]    Source BLOOD  Final    Comment: Performed at [facility], [address], [place], [place] [address]  MIC Result     Status: Abnormal  Collection Time: 09/29/24  2:37 PM  Result Value Ref Range Status   Result 1 (MIC) Comment (A)  Final    Comment: (NOTE) Methicillin - resistant Staphylococcus aureus Identification performed by account, not confirmed by this laboratory. Performed At: [facility] [address] [place], [place] [id number] Jennette Shorter MD Ey:[id number]   MRSA Next Gen by PCR, Nasal     Status: Abnormal   Collection Time: 09/30/24  5:00 PM   Specimen: Nasal Mucosa; Nasal Swab  Result Value Ref Range Status   MRSA by PCR Next Gen DETECTED (A) NOT DETECTED Final    Comment: RESULT CALLED TO, READ BACK BY AND VERIFIED WITH: TAMMY ORLANDO 2032 09/30/24 MU (NOTE) The GeneXpert MRSA Assay (FDA approved for NASAL specimens only), is one component of a comprehensive MRSA colonization surveillance program. It is not intended to diagnose MRSA infection nor to guide or monitor treatment for MRSA infections. Test performance is not FDA approved in patients less than 60 years old. Performed at [facility], [address], [place], [place] [address]   Cath Tip Culture     Status: Abnormal   Collection Time: 10/01/24 11:03 AM   Specimen: Catheter Tip; Other  Result Value Ref Range Status   Specimen Description   Final    CATH TIP Performed at [facility], [address] [place], [place] [address]    Special Requests   Final    NONE Performed at [facility] [place], [address] [place], [place] [address]    Culture METHICILLIN RESISTANT STAPHYLOCOCCUS AUREUS (A)  Final   Report Status 10/03/2024 FINAL  Final   Organism ID, Bacteria METHICILLIN RESISTANT  STAPHYLOCOCCUS AUREUS  Final      Susceptibility   Methicillin resistant staphylococcus aureus - MIC*    CIPROFLOXACIN  >=8 RESISTANT Resistant     ERYTHROMYCIN  >=8 RESISTANT Resistant     GENTAMICIN <=0.5 SENSITIVE Sensitive     OXACILLIN >=4 RESISTANT Resistant     TETRACYCLINE <=1 SENSITIVE Sensitive     VANCOMYCIN  <=0.5 SENSITIVE Sensitive     TRIMETH /SULFA  <=10 SENSITIVE Sensitive     CLINDAMYCIN >=8 RESISTANT Resistant     RIFAMPIN  <=0.5 SENSITIVE Sensitive     Inducible Clindamycin NEGATIVE Sensitive     LINEZOLID  2 SENSITIVE Sensitive     * METHICILLIN RESISTANT STAPHYLOCOCCUS AUREUS  Culture, blood (Routine X 2) w Reflex to ID Panel     Status: None (Preliminary result)   Collection Time: 10/02/24  6:36 AM   Specimen: BLOOD  Result Value Ref Range Status   Specimen Description BLOOD BLOOD LEFT ARM  Final   Special Requests   Final    BOTTLES DRAWN AEROBIC AND ANAEROBIC Blood Culture adequate volume   Culture   Final    NO GROWTH 3 DAYS Performed at [facility], [address], [place], [place] [address]    Report Status PENDING  Incomplete  Culture, blood (Routine X 2) w Reflex to ID Panel     Status: None (Preliminary result)   Collection Time: 10/02/24  6:36 AM   Specimen: BLOOD  Result Value Ref Range Status   Specimen Description BLOOD BLOOD LEFT ARM  Final   Special Requests   Final    BOTTLES DRAWN AEROBIC AND ANAEROBIC Blood Culture adequate volume   Culture   Final    NO GROWTH 3 DAYS Performed at [facility], [address] [place], [place] [address]    Report Status PENDING  Incomplete  Coagulation Studies: No results for input(s): LABPROT, INR in the last 72 hours.   Urinalysis: No results for input(s): COLORURINE, LABSPEC, PHURINE, GLUCOSEU, HGBUR, BILIRUBINUR, KETONESUR, PROTEINUR, UROBILINOGEN, NITRITE, LEUKOCYTESUR in the last 72 hours.  Invalid input(s): APPERANCEUR      Imaging: PERIPHERAL VASCULAR CATHETERIZATION Result Date: 10/05/2024 See surgical note for result.  DG Chest Port 1 View Result Date: 10/03/2024 EXAM: 1 VIEW(S) XRAY OF THE CHEST 10/03/2024 04:21:00 PM COMPARISON: 09/29/2024 CLINICAL HISTORY: Oxygen desaturation FINDINGS: LINES, TUBES AND DEVICES: Right internal jugular hemodialysis catheter removed. LUNGS AND PLEURA: Prominent vasculature slightly increased interstitial markings . Stable left lung base consolidation and small loculated left pleural effusion. Stable right basilar atelectasis. Coarsening interstitial markings. Right costophrenic angle collimated off-view No pneumothorax. HEART AND MEDIASTINUM: Stable cardiomegaly. Unchanged cardiomediastinal silhouette. BONES AND SOFT TISSUES: Thoracolumbar fusion hardware noted. No acute osseous abnormality. IMPRESSION: 1. Mild pulmonary edema. 2. Stable left lung base consolidation and small loculated left pleural effusion. Electronically signed by: Morgane Naveau MD MD 10/03/2024 07:17 PM EST RP Workstation: [id number]     Medications:    anticoagulant sodium citrate      promethazine  (PHENERGAN ) injection (IM or IVPB) 6.25 mg (10/05/24 1242)    bisacodyl   10 mg Oral QHS   carvedilol   6.25 mg Oral BID WC   Chlorhexidine  Gluconate Cloth  6 each Topical Q0600   cyanocobalamin   1,000 mcg Oral Daily   diltiazem   240 mg Oral QPM   doxepin   10 mg Oral QHS   famotidine   10 mg Oral Daily   folic acid   1 mg Oral Daily   gabapentin   300 mg Oral QHS   heparin   5,000 Units Subcutaneous Q8H   insulin  aspart  0-5 Units Subcutaneous QHS   insulin  aspart  0-6 Units Subcutaneous TID WC   insulin  aspart  7 Units Subcutaneous TID WC   insulin  glargine  20 Units Subcutaneous BID   lactulose   20 g Oral TID   levothyroxine   175 mcg Oral QHS   linaclotide   145 mcg Oral QAC breakfast   loratadine   10 mg Oral Daily   metoCLOPramide   5 mg Oral TID AC   nicotine   14 mg Transdermal Daily   pantoprazole    40 mg Oral Daily   polyethylene glycol  17 g Oral BID   senna-docusate  1 tablet Oral BID   simethicone   80 mg Oral QID   vancomycin  variable dose per unstable renal function (pharmacist dosing)   Does not apply See admin instructions   acetaminophen  **OR** acetaminophen , albuterol , alteplase , anticoagulant sodium citrate , bisacodyl , guaiFENesin , heparin , liver oil-zinc  oxide, nicotine  polacrilex, ondansetron  **OR** ondansetron  (ZOFRAN ) IV, oxyCODONE , promethazine  (PHENERGAN ) injection (IM or IVPB), sodium phosphate   Assessment/ Plan:  Eugene [name] is a 64 y.o.  male with past medical conditions including diabetes, hepatitis C, bilateral BKA, anxiety and depression, and end-stage renal disease on hemodialysis.  Patient presents to the emergency department from his facility complaining of shortness of breath.  Patient is currently admitted for SIRS (systemic inflammatory response syndrome) (HCC) [R65.10]  [facility]/MWF/120.5 kg  End-stage renal disease on hemodialysis.        Last treatment received on 1/7, 30 min.  Due to infection, appreciate vascular removing right IJ PermCath on 1/8.  Received line free holiday. Vascular has placed lt internal jugular permcath. Will attempt dialysis today, however due to swelling day of procedure, likely catheter will not function well. Will determine need for additional treatment tomorrow. Due to increased oxygen requirement,  will likely require additional treatment.   Anemia of chronic kidney disease Lab Results  Component Value Date   HGB 9.8 (L) 10/04/2024   Hemoglobin 9.8, will monitor need for ESA with dialysis.   3.  Systemic inflammatory response syndrome, suspected due to fever, leukocytosis, and tachypnea on ED arrival.  Suspected source of suprapubic catheter. Abdominal x-ray shows moderate diffuse air distention of the bowel suspicious for ileus.  Blood culture positive for MRSA. Vascular removed PermCath on 1/8 and replaced on  1/12.  Continues to receive vancomycin  ordered per primary team.    4. Secondary Hyperparathyroidism: with outpatient labs: None available  Lab Results  Component Value Date   PTH 177 (H) 04/23/2024   CALCIUM  7.6 (L) 10/04/2024   PHOS 5.0 (H) 10/02/2024   Corrected calcium  8.2.  Continue to monitor bone minerals this admission.   LOS: 6 Eugene [name] 1/12/20261:22 PM   "

## 2024-10-05 NOTE — TOC Progression Note (Signed)
 Transition of Care [facility]) - Progression Note    Patient Details  Name: Eugene [name] MRN: [id number] Date of Birth: Feb 01, 1961  Transition of Care [facility]) CM/SW Contact  Dalia GORMAN Fuse, RN Phone Number: 10/05/2024, 11:16 AM  Clinical Narrative:    To OR today for LIJ catheter. Continuing anti MRSA abtibiotics. Plan to discharge to home.   No therapy recs at this time. Please outreach to [facility] if needs are identified.                      Expected Discharge Plan and Services                                               Social Drivers of Health (SDOH) Interventions SDOH Screenings   Food Insecurity: No Food Insecurity (09/29/2024)  Housing: Low Risk (09/29/2024)  Transportation Needs: No Transportation Needs (09/29/2024)  Utilities: Not At Risk (09/29/2024)  Social Connections: Socially Isolated (09/29/2024)  Tobacco Use: Medium Risk (09/29/2024)    Readmission Risk Interventions    05/05/2024    3:05 PM  Readmission Risk Prevention Plan  Transportation Screening Complete  Medication Review (RN Care Manager) Complete  PCP or Specialist appointment within 3-5 days of discharge Complete  SW Recovery Care/Counseling Consult Complete  Palliative Care Screening Not Applicable  Skilled Nursing Facility Complete

## 2024-10-05 NOTE — Op Note (Signed)
 OPERATIVE NOTE    PRE-OPERATIVE DIAGNOSIS: 1. ESRD 2.  Recent PermCath infection with removal of right IJ PermCath  POST-OPERATIVE DIAGNOSIS: same as above  PROCEDURE: Ultrasound guidance for vascular access to the left internal jugular vein Fluoroscopic guidance for placement of catheter Left jugular venogram and superior venacavogram Angioplasty of the left innominate vein and left jugular vein with a 10 mm diameter angioplasty balloons Placement of a 23 cm tip to cuff tunneled hemodialysis catheter via the left internal jugular vein  SURGEON: Selinda Gu, MD  ANESTHESIA:  Local with Moderate conscious sedation for approximately 40 minutes using 2 mg of Versed    ESTIMATED BLOOD LOSS: 10 cc  FLUORO TIME: 7.8 minutes  CONTRAST: 10 cc  FINDING(S): 1.  Small but patent left jugular vein in the neck.  However, there was difficulty crossing the clavicle in the innominate vein and we put an 8 French sheath in and performed imaging.  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  Over the advantage wire, I first selected an 8 mm diameter angioplasty balloon and then a 10 mm diameter angioplasty balloon inflated these areas to approximately 8 atm.  There was still stenosis approaching 50% after angioplasty, but I was now able to get a catheter across these areas.  SPECIMEN(S):  None  INDICATIONS:   Eugene [name] is a 64 y.o. male who presents with renal failure and a recent right jugular PermCath infection.  The patient needs long term dialysis access for their ESRD, and a Permcath is necessary.  Risks and benefits are discussed and informed consent is obtained.    DESCRIPTION: After obtaining full informed written consent, the patient was brought back to the vascular suited. The patient's left neck and chest were sterilely prepped and draped in a sterile surgical field was created. Moderate conscious sedation was administered  during a face to face encounter with the patient throughout the procedure with my supervision of the RN administering medicines and monitoring the patient's vital signs, pulse oximetry, telemetry and mental status throughout from the start of the procedure until the patient was taken to the recovery room.  The left internal jugular vein was visualized with ultrasound and found to be patent but quite small. It was then accessed under direct ultrasound guidance and a permanent image was recorded.  This was done with a micropuncture needle and a micropuncture wire and sheath were placed.  A J-wire would not cross easily.  A advantage wire was placed. After skin nick and dilatation, the peel-away sheath was placed over the wire.  This was tedious and required multiple smaller dilators catheters initially.  It was clear there was some issue limiting passing of these dilators so I placed an 8 French sheath and performed imaging. 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  Over the advantage wire, I first selected an 8 mm diameter angioplasty balloon and then a 10 mm diameter angioplasty balloon inflated these areas to approximately 8 atm.  There was still stenosis approaching 50% after angioplasty, but I was now able to get a catheter across these areas.  A peel-away sheath was then replaced. I then turned my attention to an area under the clavicle. Approximately 1-2 fingerbreadths below the clavicle a small counterincision was created and tunneled from the subclavicular incision to the access site. Using fluoroscopic guidance, a 23 centimeter tip to cuff tunneled hemodialysis catheter  was selected, and tunneled from the subclavicular incision to the access site.  A 27 cm catheter would have been preferred, but we did not have any of those in our institution and the 31 mm catheters were likely too long and were split tip catheters which would not go over  the wire smoothly which would be necessary for placement.  It was then placed through the peel-away sheath and the peel-away sheath was removed. Using fluoroscopic guidance the catheter tips were parked in the SVC just above the right atrium.  Again, a slightly longer catheter would have been preferred, but this was what we had available and the cuff was as far and as we could put it. The appropriate distal connectors were placed. It withdrew blood well and flushed easily with heparinized saline and a concentrated heparin  solution was then placed. It was secured to the chest wall with 2 Prolene sutures. The access incision was closed single 4-0 Monocryl. A 4-0 Monocryl pursestring suture was placed around the exit site. Sterile dressings were placed. The patient tolerated the procedure well and was taken to the recovery room in stable condition.  COMPLICATIONS: None  CONDITION: Stable  Selinda Gu  10/05/2024, 10:20 AM   This note was created with Dragon Medical transcription system. Any errors in dictation are purely unintentional.

## 2024-10-05 NOTE — Progress Notes (Signed)
" °   10/05/24 1712  Vitals  Temp 98.2 F (36.8 C)  Temp Source Oral  BP 107/63  MAP (mmHg) 75  BP Location Left Arm  BP Method Automatic  Patient Position (if appropriate) Lying  Pulse Rate Source Monitor  ECG Heart Rate 77  Resp 18  During Treatment Monitoring  Blood Flow Rate (mL/min) 0 mL/min  Arterial Pressure (mmHg) -12.32 mmHg  Venous Pressure (mmHg) 523.81 mmHg  TMP (mmHg) 79.39 mmHg  Ultrafiltration Rate (mL/min) 77 mL/min  Dialysate Flow Rate (mL/min) 299 ml/min  Duration of HD Treatment -hour(s) 3.47 hour(s)  Cumulative Fluid Removed (mL) per Treatment  1853.16  HD Safety Checks Performed Yes  Intra-Hemodialysis Comments Tx completed  Post Treatment  Dialyzer Clearance Heavily streaked  Hemodialysis Intake (mL) 0 mL  Liters Processed 55  Fluid Removed (mL) 1900 mL  Tolerated HD Treatment Yes  Post-Hemodialysis Comments Tx has been tolerated. 1900 removed without difficulty. Access is clean, dry and intact. Vs are stable.No verbalize concerns. Stable for discharge.  AVG/AVF Arterial Site Held (minutes) 0 minutes  AVG/AVF Venous Site Held (minutes) 0 minutes    "

## 2024-10-05 NOTE — Progress Notes (Signed)
 "  Date of Admission:  09/29/2024      ID: Eugene [name] is a 64 y.o. male  Principal Problem:   SIRS (systemic inflammatory response syndrome) (HCC) Active Problems:   Uncontrolled type 2 diabetes mellitus with hyperglycemia, with long-term current use of insulin  (HCC)   Chronic pain   Abdominal discomfort   Hypothyroidism   Paroxysmal SVT (supraventricular tachycardia)   ESRD (end stage renal disease) (HCC)   Chronic respiratory failure with hypoxia (HCC)   Chronic bilateral pleural effusions   Heart failure with preserved ejection fraction (HCC)   Chronic constipation   Obesity (BMI 30-39.9)   MRSA bacteremia    Subjective: Pt has not had a bowel movt in 6 days Still nauseous   Medications:   bisacodyl   10 mg Oral QHS   carvedilol   6.25 mg Oral BID WC   Chlorhexidine  Gluconate Cloth  6 each Topical Q0600   cyanocobalamin   1,000 mcg Oral Daily   diltiazem   240 mg Oral QPM   doxepin   10 mg Oral QHS   famotidine   10 mg Oral Daily   folic acid   1 mg Oral Daily   gabapentin   300 mg Oral QHS   heparin   5,000 Units Subcutaneous Q8H   insulin  aspart  0-5 Units Subcutaneous QHS   insulin  aspart  0-6 Units Subcutaneous TID WC   insulin  aspart  7 Units Subcutaneous TID WC   insulin  glargine  20 Units Subcutaneous BID   lactulose   20 g Oral TID   levothyroxine   175 mcg Oral QHS   linaclotide   145 mcg Oral QAC breakfast   loratadine   10 mg Oral Daily   metoCLOPramide   5 mg Oral TID AC   nicotine   14 mg Transdermal Daily   pantoprazole   40 mg Oral Daily   polyethylene glycol  17 g Oral BID   senna-docusate  1 tablet Oral BID   simethicone   80 mg Oral QID   vancomycin  variable dose per unstable renal function (pharmacist dosing)   Does not apply See admin instructions    Objective: Vital signs in last 24 hours: Patient Vitals for the past 24 hrs:  BP Temp Temp src Pulse Resp SpO2 Height Weight  10/05/24 2015 134/69 98.5 F (36.9 C) Oral 77 16 98 % -- --  10/05/24 1712  107/63 98.2 F (36.8 C) Oral -- 18 -- -- --  10/05/24 1700 113/63 -- -- -- (!) 29 -- -- --  10/05/24 1630 (!) 102/55 -- -- -- 16 -- -- --  10/05/24 1600 (!) 104/57 -- -- 76 19 96 % -- --  10/05/24 1530 108/66 -- -- -- (!) 22 -- -- --  10/05/24 1500 111/66 -- -- 80 (!) 21 96 % -- --  10/05/24 1430 110/64 -- -- 81 (!) 26 98 % -- --  10/05/24 1400 103/60 -- -- 81 (!) 21 91 % -- --  10/05/24 1306 (!) 143/70 98.1 F (36.7 C) Oral 77 -- 95 % -- 124.6 kg  10/05/24 1118 (!) 144/75 98.9 F (37.2 C) Oral 78 16 96 % -- --  10/05/24 1100 133/73 -- -- 79 (!) 9 94 % -- --  10/05/24 1045 138/74 -- -- 79 (!) 24 91 % -- --  10/05/24 1033 127/74 97.6 F (36.4 C) Temporal 79 20 93 % -- --  10/05/24 1017 (!) 149/76 -- -- 88 (!) 22 (!) 81 % -- --  10/05/24 1012 (!) 144/79 -- -- 87 20 (!) 89 % -- --  10/05/24 1007 (!) 148/76 -- -- 87 (!) 22 93 % -- --  10/05/24 1002 133/77 -- -- 88 (!) 21 93 % -- --  10/05/24 0957 135/79 -- -- 88 (!) 21 -- -- --  10/05/24 0952 (!) 146/75 -- -- 89 (!) 21 (!) 83 % -- --  10/05/24 0947 (!) 152/84 -- -- 90 (!) 22 91 % -- --  10/05/24 0942 (!) 145/78 -- -- 90 (!) 21 91 % -- --  10/05/24 0937 (!) 157/84 -- -- 89 (!) 22 93 % -- --  10/05/24 0932 (!) 157/83 -- -- 90 14 95 % -- --  10/05/24 0927 (!) 166/90 -- -- 91 19 94 % -- --  10/05/24 0922 (!) 163/83 -- -- 90 20 97 % -- --  10/05/24 0918 -- -- -- -- -- 95 % -- --  10/05/24 0917 (!) 151/86 -- -- 93 19 (!) 78 % -- --  10/05/24 0912 (!) 156/81 -- -- 91 11 (!) 89 % -- --  10/05/24 0907 (!) 150/84 -- -- 92 (!) 22 (!) 87 % -- --  10/05/24 0904 (!) 151/81 -- -- 92 14 (!) 85 % -- --  10/05/24 0902 -- -- -- 92 -- -- -- --  10/05/24 0902 -- -- -- -- -- (!) 88 % -- --  10/05/24 0842 (!) 144/68 -- -- 78 (!) 21 91 % -- --  10/05/24 0834 -- (!) 97 F (36.1 C) Tympanic 80 19 91 % 5' 10 (1.778 m) 123.6 kg  10/05/24 0805 (!) 138/53 98.4 F (36.9 C) -- 91 16 95 % -- --  10/05/24 0416 (!) 141/65 99.1 F (37.3 C) Oral 90 20 94 % -- --   10/05/24 0215 -- -- -- -- -- 93 % -- --  10/04/24 2124 -- -- -- -- -- 93 % -- --     PHYSICAL EXAM:  General: lethargic- arousable Lungs: Clear to auscultation bilaterally. No Wheezing or Rhonchi. No rales. Heart: Regular rate and rhythm, no murmur, rub or gallop. Abdomen: Soft, non-tender,not distended. Bowel sounds normal. No masses Extremities: b/l bka Skin: No rashes or lesions. Or bruising Lymph: Cervical, supraclavicular normal. Neurologic: Grossly non-focal  Lab Results    Latest Ref Rng & Units 10/04/2024    8:02 AM 10/03/2024    6:28 AM 10/01/2024    1:30 PM  CBC  WBC 4.0 - 10.5 K/uL 7.9  8.5  10.8   Hemoglobin 13.0 - 17.0 g/dL 9.8  89.9  89.5   Hematocrit 39.0 - 52.0 % 30.9  31.4  32.3   Platelets 150 - 400 K/uL 102  85  93        Latest Ref Rng & Units 10/05/2024    2:10 PM 10/04/2024    8:02 AM 10/03/2024    6:28 AM  CMP  Glucose 70 - 99 mg/dL 744  722  706   BUN 8 - 23 mg/dL 57  75  72   Creatinine 0.61 - 1.24 mg/dL 6.10  4.60  4.84   Sodium 135 - 145 mmol/L 137  137  138   Potassium 3.5 - 5.1 mmol/L 4.2  4.0  4.3   Chloride 98 - 111 mmol/L 103  105  104   CO2 22 - 32 mmol/L 24  22  24    Calcium  8.9 - 10.3 mg/dL 8.0  7.6  7.6       Microbiology: 1/6 [facility] MRSA Studies/Results: PERIPHERAL VASCULAR CATHETERIZATION Result Date: 10/05/2024 See surgical note  for result.    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

## 2024-10-05 NOTE — Interval H&P Note (Signed)
 History and Physical Interval Note:  10/05/2024 7:54 AM  Eugene [name]  has presented today for surgery, with the diagnosis of ESRD.  The various methods of treatment have been discussed with the patient and family. After consideration of risks, benefits and other options for treatment, the patient has consented to  Procedures: DIALYSIS/PERMA CATHETER INSERTION (N/A) as a surgical intervention.  The patient's history has been reviewed, patient examined, no change in status, stable for surgery.  I have reviewed the patient's chart and labs.  Questions were answered to the patient's satisfaction.     [name]

## 2024-10-05 NOTE — Consult Note (Signed)
 Pharmacy Antibiotic Note  Eugene [name] is a 64 y.o. male with a PMH of ESRD on HD MWF, HTN, DM, HLD and anxiety is admitted on 09/29/2024 with sepsis  and SOB. Pharmacy has been consulted for vancomycin   dosing.  Today, 10/05/2024 Day #6 vancomycin  Renal: ESRD on HD - MWF HD catheter removed 1/8 for line holiday New line placed 1/12 WBC WNL Afebrile 1/6 blood cx: 4/4 GPC, BCID MRSA 1/9 repeat blood cx: NGTD 1/8 cath tip. S. aureus Urine cx: >100K P mirabilis, MRSA  Vancomycin  dosing and levels:  Vancomycin  2gm IV x 1 1/6 at 2236. Per HD documentation received about 2h of HD on 1/7.  Random vancomycin  level 1/9 = 18 mcg/mL, vancomycin  1gm x 1 given 1/9 at 12:12 1/12 1410 random vancomycin  level.  No doses give since 1/9.  Level not drawn with am labs as ordered  Plan: Awaiting random level today to see if need to redose. HD flowsheet has HD starting at 13:30, if this is accurate this was before level was drawnand may effect value.  Follow level and need to redose after HD today  Planning for TEE  Height: 5' 10 (177.8 cm) Weight: 124.6 kg (274 lb 11.1 oz) IBW/kg (Calculated) : 73  Temp (24hrs), Avg:98.3 F (36.8 C), Min:97 F (36.1 C), Max:99.1 F (37.3 C)  Recent Labs  Lab 09/29/24 1437 09/29/24 2006 09/30/24 0425 09/30/24 0727 10/01/24 1330 10/02/24 0627 10/03/24 0628 10/04/24 0802 10/05/24 1410  WBC 26.4*  --  16.8*  --  10.8*  --  8.5 7.9  --   CREATININE 4.37*  --   --    < > 4.47* 4.73* 5.15* 5.39* 3.89*  LATICACIDVEN 1.7 1.5  --   --   --   --   --   --   --   VANCORANDOM  --   --   --   --   --  18  --   --   --    < > = values in this interval not displayed.    Estimated Creatinine Clearance: 26.4 mL/min (A) (by C-G formula based on SCr of 3.89 mg/dL (H)).    Allergies[1]  Antimicrobials this admission: 1/6 cefepime  >> 1/6 1/6 vancomycin  >>   Thank you for allowing pharmacy to be a part of this patients care.  Glynda Soliday, PharmD, BCPS,  BCIDP Work Cell: [phone] 10/05/2024 5:00 PM          [1] No Known Allergies

## 2024-10-05 NOTE — Progress Notes (Signed)
 " Progress Note   Patient: Eugene [name] FMW:[id number] DOB: 12-Feb-1961 DOA: 09/29/2024     6 DOS: the patient was seen and examined on 10/05/2024   Brief hospital course: HPI: Eugene [name] is a 64 y.o. male with medical history significant of anxiety, depression, type 2 diabetes mellitus, history of hepatitis C, end-stage renal disease on dialysis, bilateral BKA.  Patient known to most historian.  States he is short of breath.  He is sick to his stomach.  He vomited.  No diarrhea.  He is short of breath.  He is having a fever.  In the ER he was found to have an elevated white blood cell count and started on sepsis protocol.  Respiratory panel pending, chest x-ray shows mild interstitial edema and left greater than right pleural effusion.  Urine looks positive and patient has a suprapubic catheter.  Hospitalist services contacted for further evaluation.   Assessment and Plan: *Sepsis secondary to MRSA bacteremia Present on admission.  Sepsis criteria fever leukocytosis and tachycardia.  Now resolved.  MRSA positive in 4 out of 4 bottles. HD catheter removed 1/8 Afebrile overnight Repeat blood cultures no growth to date Continue vancomycin  -- Continue to follow blood cultures -- Plan for placement of tunneled dialysis catheter this a.m.  Normocytic anemia Thrombocytopenia improving Likely in settong of anemia of chronic disease and infection. H/o B12 deficiency Continue B12 supplementation, folate borderline, continue supplementation  Abdominal discomfort improving N/V  Unclear etiology.  Improved after a large bowel movement.  Possible component of gastroparesis.  CT of the abdomen pelvis with contrast notable for proctocolitis but no findings to suggest etiology of his nausea.   --Continue antiemetics, aggressive bowel regimen -- Continue trial of Reglan   ESRD (end stage renal disease) (HCC) Hemodialysis per nephrology  Paroxysmal SVT (supraventricular tachycardia) On Cardizem  and  Coreg   Heart failure with preserved ejection fraction (HCC) Dialysis to manage fluid  Acute on chronic respiratory failure with hypoxia (HCC) resolving Patient oxygen level intermittently increased for comfort, he maintained appropriate oxygen saturations on 6 L.  Patient currently refusing CPAP machine. Incentive spirometry Maintain O2 saturations greater than 92%  Asymptomatic bacteruria  Ucx with proteus mirabilis, and MRSA. MRSA due to MRSA bacteremia likely in setting of line infection.  Receiving anti MRSA antibiotics.   HTN Blood pressure remains slightly elevated. Continue to hold midodrine    Chronic bilateral pleural effusions Small.  Left greater than right.  Hd.   Chronic pain On oxycodone   Uncontrolled type 2 diabetes mellitus with hyperglycemia, with long-term current use of insulin  (HCC) A1c 7.3 though ESRD.  Remains poorly controlled will increase Lantus  to 20 units 2 times daily +7 units mealtime insulin  and  chronic constipation Patient on numerous medications for constipation.  Had a bowel movement this a.m. --Continue with aggressive bowel regimen   Obesity (BMI 30-39.9) BMI 37.64 Complicates care  Hypothyroidism On levothyroxine        Subjective: No acute issues overnight.  Physical Exam: Vitals:   10/05/24 1600 10/05/24 1630 10/05/24 1700 10/05/24 1712  BP: (!) 104/57 (!) 102/55 113/63 107/63  Pulse: 76     Resp: 19 16 (!) 29 18  Temp:    98.2 F (36.8 C)  TempSrc:    Oral  SpO2: 96%     Weight:      Height:        Constitutional: In no distress.  Cardiovascular: Normal rate, regular rhythm. No lower extremity edema  Pulmonary: Non labored breathing on [place], no wheezing or  rales.   Abdominal: Soft. Obese, Non distended and non tender Musculoskeletal: s/p bilateral BKA     Neurological: Alert and oriented to person, place, and time. Non focal  Skin: Skin is warm and dry.    Data Reviewed:     Latest Ref Rng & Units 10/05/2024     2:10 PM 10/04/2024    8:02 AM 10/03/2024    6:28 AM  BMP  Glucose 70 - 99 mg/dL 744  722  706   BUN 8 - 23 mg/dL 57  75  72   Creatinine 0.61 - 1.24 mg/dL 6.10  4.60  4.84   Sodium 135 - 145 mmol/L 137  137  138   Potassium 3.5 - 5.1 mmol/L 4.2  4.0  4.3   Chloride 98 - 111 mmol/L 103  105  104   CO2 22 - 32 mmol/L 24  22  24    Calcium  8.9 - 10.3 mg/dL 8.0  7.6  7.6       Latest Ref Rng & Units 10/04/2024    8:02 AM 10/03/2024    6:28 AM 10/01/2024    1:30 PM  CBC  WBC 4.0 - 10.5 K/uL 7.9  8.5  10.8   Hemoglobin 13.0 - 17.0 g/dL 9.8  89.9  89.5   Hematocrit 39.0 - 52.0 % 30.9  31.4  32.3   Platelets 150 - 400 K/uL 102  85  93      Family Communication: None at bedside.   Disposition: Status is: Inpatient Remains inpatient appropriate because: MRSA bacteremia, IV antibiotics  Planned Discharge Destination: Pending clinical course     Time spent: 35 minutes  Author: Alban Pepper, MD 10/05/2024 6:23 PM  For on call review [email or web address].  "

## 2024-10-06 ENCOUNTER — Inpatient Hospital Stay

## 2024-10-06 DIAGNOSIS — B9562 Methicillin resistant Staphylococcus aureus infection as the cause of diseases classified elsewhere: Secondary | ICD-10-CM | POA: Diagnosis not present

## 2024-10-06 DIAGNOSIS — R7881 Bacteremia: Secondary | ICD-10-CM | POA: Diagnosis not present

## 2024-10-06 LAB — RENAL FUNCTION PANEL
Albumin: 2.6 g/dL — ABNORMAL LOW (ref 3.5–5.0)
Anion gap: 9 (ref 5–15)
BUN: 51 mg/dL — ABNORMAL HIGH (ref 8–23)
CO2: 25 mmol/L (ref 22–32)
Calcium: 8.1 mg/dL — ABNORMAL LOW (ref 8.9–10.3)
Chloride: 102 mmol/L (ref 98–111)
Creatinine, Ser: 4.33 mg/dL — ABNORMAL HIGH (ref 0.61–1.24)
GFR, Estimated: 15 mL/min — ABNORMAL LOW
Glucose, Bld: 229 mg/dL — ABNORMAL HIGH (ref 70–99)
Phosphorus: 4.2 mg/dL (ref 2.5–4.6)
Potassium: 4.4 mmol/L (ref 3.5–5.1)
Sodium: 136 mmol/L (ref 135–145)

## 2024-10-06 LAB — CBC WITH DIFFERENTIAL/PLATELET
Abs Immature Granulocytes: 0.06 K/uL (ref 0.00–0.07)
Basophils Absolute: 0 K/uL (ref 0.0–0.1)
Basophils Relative: 0 %
Eosinophils Absolute: 0.3 K/uL (ref 0.0–0.5)
Eosinophils Relative: 4 %
HCT: 31.2 % — ABNORMAL LOW (ref 39.0–52.0)
Hemoglobin: 9.8 g/dL — ABNORMAL LOW (ref 13.0–17.0)
Immature Granulocytes: 1 %
Lymphocytes Relative: 21 %
Lymphs Abs: 1.4 K/uL (ref 0.7–4.0)
MCH: 29.5 pg (ref 26.0–34.0)
MCHC: 31.4 g/dL (ref 30.0–36.0)
MCV: 94 fL (ref 80.0–100.0)
Monocytes Absolute: 0.5 K/uL (ref 0.1–1.0)
Monocytes Relative: 7 %
Neutro Abs: 4.6 K/uL (ref 1.7–7.7)
Neutrophils Relative %: 67 %
Platelets: 129 K/uL — ABNORMAL LOW (ref 150–400)
RBC: 3.32 MIL/uL — ABNORMAL LOW (ref 4.22–5.81)
RDW: 13.9 % (ref 11.5–15.5)
WBC: 6.9 K/uL (ref 4.0–10.5)
nRBC: 0 % (ref 0.0–0.2)

## 2024-10-06 LAB — GLUCOSE, CAPILLARY
Glucose-Capillary: 267 mg/dL — ABNORMAL HIGH (ref 70–99)
Glucose-Capillary: 324 mg/dL — ABNORMAL HIGH (ref 70–99)
Glucose-Capillary: 248 mg/dL — ABNORMAL HIGH (ref 70–99)
Glucose-Capillary: 175 mg/dL — ABNORMAL HIGH (ref 70–99)

## 2024-10-06 LAB — BLOOD GAS, ARTERIAL
Acid-base deficit: 3.9 mmol/L — ABNORMAL HIGH (ref 0.0–2.0)
Bicarbonate: 22.2 mmol/L (ref 20.0–28.0)
O2 Content: 6 L/min
O2 Saturation: 96.2 %
Patient temperature: 37
pCO2 arterial: 43 mmHg (ref 32–48)
pH, Arterial: 7.32 — ABNORMAL LOW (ref 7.35–7.45)
pO2, Arterial: 70 mmHg — ABNORMAL LOW (ref 83–108)

## 2024-10-06 MED ORDER — INSULIN GLARGINE 100 UNIT/ML ~~LOC~~ SOLN
22.0000 [IU] | Freq: Two times a day (BID) | SUBCUTANEOUS | Status: DC
  Administered 2024-10-06 – 2024-10-15 (×18): 22 [IU] via SUBCUTANEOUS
  Filled 2024-10-06 (×19): qty 0.22

## 2024-10-06 MED ORDER — MUPIROCIN 2 % EX OINT
1.0000 | TOPICAL_OINTMENT | Freq: Two times a day (BID) | CUTANEOUS | Status: AC
  Administered 2024-10-06 – 2024-10-10 (×10): 1 via NASAL
  Filled 2024-10-06: qty 22

## 2024-10-06 MED ORDER — IOHEXOL 350 MG/ML SOLN
100.0000 mL | Freq: Once | INTRAVENOUS | Status: AC | PRN
  Administered 2024-10-06: 100 mL via INTRAVENOUS

## 2024-10-06 MED ORDER — LACTULOSE 10 GM/15ML PO SOLN: 20.0000 g | Freq: Two times a day (BID) | ORAL | Status: AC | PRN

## 2024-10-06 MED ORDER — INSULIN ASPART 100 UNIT/ML IJ SOLN
9.0000 [IU] | Freq: Three times a day (TID) | INTRAMUSCULAR | Status: DC
  Administered 2024-10-06 – 2024-10-15 (×21): 9 [IU] via SUBCUTANEOUS
  Filled 2024-10-06 (×21): qty 9

## 2024-10-06 MED ORDER — VANCOMYCIN HCL 1250 MG/250ML IV SOLN
1250.0000 mg | INTRAVENOUS | Status: DC
  Administered 2024-10-07 – 2024-10-09 (×2): 1250 mg via INTRAVENOUS
  Filled 2024-10-06 (×3): qty 250

## 2024-10-06 MED ORDER — METOCLOPRAMIDE HCL 5 MG/ML IJ SOLN
5.0000 mg | Freq: Three times a day (TID) | INTRAMUSCULAR | Status: AC
  Administered 2024-10-06 (×3): 5 mg via INTRAVENOUS
  Filled 2024-10-06 (×3): qty 2

## 2024-10-06 NOTE — Consult Note (Signed)
 Pharmacy Antibiotic Note  Eugene [name] is a 64 y.o. male with a PMH of ESRD on HD MWF, HTN, DM, HLD and anxiety is admitted on 09/29/2024 with sepsis  and SOB. Pharmacy has been consulted for vancomycin   dosing.  Today, 10/06/2024 Day #7 vancomycin  Renal: ESRD on HD - MWF HD catheter removed 1/8 for line holiday New line placed 1/12 WBC WNL Afebrile 1/6 blood cx: 4/4 GPC, BCID MRSA 1/9 repeat blood cx: NGTD 1/8 cath tip. S. aureus Urine cx: >100K P mirabilis, MRSA  Vancomycin  dosing and levels:  Vancomycin  2gm IV x 1 1/6 at 2236. Per HD documentation received about 2h of HD on 1/7.  Random vancomycin  level 1/9 = 18 mcg/mL, vancomycin  1gm x 1 given 1/9 at 12:12 1/12 1410 random vancomycin  level = 12 mcg/ml.  No doses give since 1/9.  Level not drawn with am labs as ordered  Plan: Plan is to resume HD on his previous schedule (MWF) and no extra HD today, so will start vancomycin  1250mg  IV on HD days.   Asking renal team if vancomycin  1250mg  dose would be issue at outpatient HD - confirmed this dose is available at HD center Following plan for possible TEE  Height: 5' 10 (177.8 cm) Weight: 124.6 kg (274 lb 11.1 oz) IBW/kg (Calculated) : 73  Temp (24hrs), Avg:98.3 F (36.8 C), Min:98 F (36.7 C), Max:98.7 F (37.1 C)  Recent Labs  Lab 09/29/24 1437 09/29/24 2006 09/30/24 0425 09/30/24 0727 10/01/24 1330 10/02/24 0627 10/03/24 0628 10/04/24 0802 10/05/24 1410 10/06/24 0524  WBC 26.4*  --  16.8*  --  10.8*  --  8.5 7.9  --  6.9  CREATININE 4.37*  --   --    < > 4.47* 4.73* 5.15* 5.39* 3.89* 4.33*  LATICACIDVEN 1.7 1.5  --   --   --   --   --   --   --   --   VANCORANDOM  --   --   --   --   --  18  --   --  12  --    < > = values in this interval not displayed.    Estimated Creatinine Clearance: 23.7 mL/min (A) (by C-G formula based on SCr of 4.33 mg/dL (H)).    Allergies[1]  Antimicrobials this admission: 1/6 cefepime  >> 1/6 1/6 vancomycin  >>   Thank you for  allowing pharmacy to be a part of this patients care.  Aurie Harroun, PharmD, BCPS, BCIDP Work Cell: [phone] 10/06/2024 12:37 PM            [1] No Known Allergies

## 2024-10-06 NOTE — Evaluation (Signed)
 Physical Therapy Evaluation Patient Details Name: Eugene [name] MRN: [id number] DOB: 07/20/1961 Today's Date: 10/06/2024  History of Present Illness  Pt is a 64 yo male that presented to the ED for fever, upset stomach. Workup for sepsis 2/2 MRSA. PMH of anxiety, depression, type 2 diabetes mellitus, history of hepatitis C, end-stage renal disease on dialysis, bilateral BKA, thoracic surgeries.   Clinical Impression  Pt with some lethargy, oriented to self, place. 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 Bed level for ADLs, pt able to roll in bed modI. Session limited due to arrival of transport, but he was able to roll with modI, bed rails, and extra time. Further assessment of sitting balance and ability to manage WC needed. Recommend return to LTC with followup therapies as able.         If plan is discharge home, recommend the following: Assistance with cooking/housework;Two people to help with bathing/dressing/bathroom;Two people to help with walking and/or transfers;Direct supervision/assist for medications management;Help with stairs or ramp for entrance;Assist for transportation   Can travel by private vehicle        Equipment Recommendations None recommended by PT  Recommendations for Other Services       Functional Status Assessment Patient has had a recent decline in their functional status and demonstrates the ability to make significant improvements in function in a reasonable and predictable amount of time.     Precautions / Restrictions Precautions Precautions: Fall Recall of Precautions/Restrictions: Impaired Precaution/Restrictions Comments: old B BKA Restrictions Weight Bearing Restrictions Per Provider Order: No      Mobility  Bed Mobility Overal bed mobility: Needs Assistance Bed Mobility: Rolling Rolling: Supervision, Used rails         General bed mobility comments: roll both L<>R with use of  rails    Transfers                   General transfer comment: transport at bedside    Ambulation/Gait                  Stairs            Wheelchair Mobility     Tilt Bed    Modified Rankin (Stroke Patients Only)       Balance                                             Pertinent Vitals/Pain Pain Assessment Pain Assessment: No/denies pain    Home Living Family/patient expects to be discharged to:: Skilled nursing facility                   Additional Comments: pt lives at [facility]    Prior Function Prior Level of Function : Needs assist             Mobility Comments: hoyer lift to [employer], pt reports he self propels after in mwc ADLs Comments: assist for all ADLs including bathing/toileting at bed level; assist for IADLs     Extremity/Trunk Assessment   Upper Extremity Assessment Upper Extremity Assessment: Overall WFL for tasks assessed    Lower Extremity Assessment Lower Extremity Assessment:  (bilat BKA)       Communication   Communication Communication: Impaired Factors Affecting Communication: Difficulty expressing self    Cognition Arousal: Lethargic  Behavior During Therapy: Flat affect                             Following commands: Impaired Following commands impaired: Follows one step commands with increased time     Cueing Cueing Techniques: Verbal cues, Gestural cues, Tactile cues, Visual cues     General Comments General comments (skin integrity, edema, etc.): pt to 85% on 6L via HFNC after rolling, >90% after approx 30 seconds PLB    Exercises     Assessment/Plan    PT Assessment Patient needs continued PT services  PT Problem List Decreased activity tolerance;Decreased mobility       PT Treatment Interventions Balance training;Neuromuscular re-education;Functional mobility training;Patient/family education;Wheelchair mobility training;Therapeutic  activities;Therapeutic exercise    PT Goals (Current goals can be found in the Care Plan section)  Acute Rehab PT Goals Patient Stated Goal: to feel better PT Goal Formulation: With patient Time For Goal Achievement: 10/20/24 Potential to Achieve Goals: Good Additional Goals Additional Goal #1: Pt will self propel manual WC ~51ft with supervision.    Frequency Min 1X/week     Co-evaluation PT/OT/SLP Co-Evaluation/Treatment: Yes Reason for Co-Treatment: To address functional/ADL transfers PT goals addressed during session: Mobility/safety with mobility OT goals addressed during session: ADL's and self-care       AM-PAC PT 6 Clicks Mobility  Outcome Measure Help needed turning from your back to your side while in a flat bed without using bedrails?: A Lot Help needed moving from lying on your back to sitting on the side of a flat bed without using bedrails?: A Lot Help needed moving to and from a bed to a chair (including a wheelchair)?: Total Help needed standing up from a chair using your arms (e.g., wheelchair or bedside chair)?: Total Help needed to walk in hospital room?: Total Help needed climbing 3-5 steps with a railing? : Total 6 Click Score: 8    End of Session   Activity Tolerance: Patient tolerated treatment well Patient left: in bed;with call bell/phone within reach Nurse Communication: Mobility status PT Visit Diagnosis: Other abnormalities of gait and mobility (R26.89)    Time: 8670-8662 PT Time Calculation (min) (ACUTE ONLY): 8 min   Charges:   PT Evaluation $PT Eval Moderate Complexity: 1 Mod   PT General Charges $$ ACUTE PT VISIT: 1 Visit         Doyal Shams PT, DPT 4:10 PM,10/06/2024

## 2024-10-06 NOTE — Progress Notes (Signed)
 " [facility]  ROUNDING NOTE   Subjective:   Eugene [name] is a 64 year old male with past medical conditions including diabetes, hepatitis C, bilateral BKA, anxiety and depression, and end-stage renal disease on hemodialysis.  Patient presents to the emergency department from his facility complaining of shortness of breath.  Patient is currently admitted for SIRS (systemic inflammatory response syndrome) (HCC) [R65.10]  Patient is known to our practice and receives outpatient dialysis treatments at [facility] on a MWF schedule, supervised by Dr. Dennise.    Update:  Patient seen laying in bed Remains on 10L HFNC Reports soreness from left neck   Objective:  Vital signs in last 24 hours:  Temp:  [98.1 F (36.7 C)-98.9 F (37.2 C)] 98.1 F (36.7 C) (01/13 0852) Pulse Rate:  [76-95] 78 (01/13 0852) Resp:  [9-29] 19 (01/13 0852) BP: (102-144)/(55-82) 122/60 (01/13 0852) SpO2:  [91 %-98 %] 92 % (01/13 0852) Weight:  [124.6 kg] 124.6 kg (01/12 1306)  Weight change:  Filed Weights   09/30/24 1043 10/05/24 0834 10/05/24 1306  Weight: 123.6 kg 123.6 kg 124.6 kg    Intake/Output: I/O last 3 completed shifts: In: 569.9 [IV Piggyback:569.9] Out: 3050 [Urine:1150; Other:1900]   Intake/Output this shift:  No intake/output data recorded.  Physical Exam: General: NAD  Head: Normocephalic  Eyes: Anicteric  Lungs:  Wheeze, [place] 10 L HFNC  Heart: Regular rate and rhythm  Abdomen:  Soft, nontender  Extremities: Trace edema.  Bilateral BKA  Neurologic: Awake, alert, conversant  Skin: Warm,dry, no rash  Access: Lt internal jugular permcath    Basic Metabolic Panel: Recent Labs  Lab 10/02/24 0627 10/03/24 0628 10/04/24 0802 10/05/24 1410 10/06/24 0524  NA 140 138 137 137 136  K 4.0 4.3 4.0 4.2 4.4  CL 104 104 105 103 102  CO2 22 24 22 24 25   GLUCOSE 203* 293* 277* 255* 229*  BUN 66* 72* 75* 57* 51*  CREATININE 4.73* 5.15* 5.39* 3.89* 4.33*  CALCIUM  7.6*  7.6* 7.6* 8.0* 8.1*  MG  --   --  1.9  --   --   PHOS 5.0*  --   --   --  4.2    Liver Function Tests: Recent Labs  Lab 09/29/24 1437 10/06/24 0524  AST 21  --   ALT 28  --   ALKPHOS 83  --   BILITOT 0.4  --   PROT 6.5  --   ALBUMIN  3.2* 2.6*   Recent Labs  Lab 09/30/24 0727  LIPASE <10*   No results for input(s): AMMONIA in the last 168 hours.  CBC: Recent Labs  Lab 09/29/24 1437 09/30/24 0425 10/01/24 1330 10/03/24 0628 10/04/24 0802 10/06/24 0524  WBC 26.4* 16.8* 10.8* 8.5 7.9 6.9  NEUTROABS 23.2*  --  9.3*  --   --  4.6  HGB 11.5* 9.9* 10.4* 10.0* 9.8* 9.8*  HCT 36.1* 32.7* 32.3* 31.4* 30.9* 31.2*  MCV 94.0 97.0 93.1 94.0 94.5 94.0  PLT 139* 118* 93* 85* 102* 129*    Cardiac Enzymes: No results for input(s): CKTOTAL, CKMB, CKMBINDEX, TROPONINI in the last 168 hours.  BNP: Invalid input(s): POCBNP  CBG: Recent Labs  Lab 10/05/24 1117 10/05/24 1504 10/05/24 1808 10/05/24 2147 10/06/24 0853  GLUCAP 318* 207* 197* 218* 267*    Microbiology: Results for orders placed or performed during the hospital encounter of 09/29/24  Resp panel by RT-PCR (RSV, Flu A&B, Covid) Anterior Nasal Swab     Status: None  Collection Time: 09/29/24  2:16 PM   Specimen: Anterior Nasal Swab  Result Value Ref Range Status   SARS Coronavirus 2 by RT PCR NEGATIVE NEGATIVE Final    Comment: (NOTE) SARS-CoV-2 target nucleic acids are NOT DETECTED.  The SARS-CoV-2 RNA is generally detectable in upper respiratory specimens during the acute phase of infection. The lowest concentration of SARS-CoV-2 viral copies this assay can detect is 138 copies/mL. A negative result does not preclude SARS-Cov-2 infection and should not be used as the sole basis for treatment or other patient management decisions. A negative result may occur with  improper specimen collection/handling, submission of specimen other than nasopharyngeal swab, presence of viral mutation(s) within  the areas targeted by this assay, and inadequate number of viral copies(<138 copies/mL). A negative result must be combined with clinical observations, patient history, and epidemiological information. The expected result is Negative.  Fact Sheet for Patients:  bloggercourse.com  Fact Sheet for Healthcare Providers:  seriousbroker.it  This test is no t yet approved or cleared by the [place]  FDA and  has been authorized for detection and/or diagnosis of SARS-CoV-2 by FDA under an Emergency Use Authorization (EUA). This EUA will remain  in effect (meaning this test can be used) for the duration of the COVID-19 declaration under Section 564(b)(1) of the Act, 21 U.S.C.section 360bbb-3(b)(1), unless the authorization is terminated  or revoked sooner.       Influenza A by PCR NEGATIVE NEGATIVE Final   Influenza B by PCR NEGATIVE NEGATIVE Final    Comment: (NOTE) The Xpert Xpress SARS-CoV-2/FLU/RSV plus assay is intended as an aid in the diagnosis of influenza from Nasopharyngeal swab specimens and should not be used as a sole basis for treatment. Nasal washings and aspirates are unacceptable for Xpert Xpress SARS-CoV-2/FLU/RSV testing.  Fact Sheet for Patients: bloggercourse.com  Fact Sheet for Healthcare Providers: seriousbroker.it  This test is not yet approved or cleared by the [place]  FDA and has been authorized for detection and/or diagnosis of SARS-CoV-2 by FDA under an Emergency Use Authorization (EUA). This EUA will remain in effect (meaning this test can be used) for the duration of the COVID-19 declaration under Section 564(b)(1) of the Act, 21 U.S.C. section 360bbb-3(b)(1), unless the authorization is terminated or revoked.     Resp Syncytial Virus by PCR NEGATIVE NEGATIVE Final    Comment: (NOTE) Fact Sheet for  Patients: bloggercourse.com  Fact Sheet for Healthcare Providers: seriousbroker.it  This test is not yet approved or cleared by the [place]  FDA and has been authorized for detection and/or diagnosis of SARS-CoV-2 by FDA under an Emergency Use Authorization (EUA). This EUA will remain in effect (meaning this test can be used) for the duration of the COVID-19 declaration under Section 564(b)(1) of the Act, 21 U.S.C. section 360bbb-3(b)(1), unless the authorization is terminated or revoked.  Performed at [facility], [address], [place], [place] [address]   Urine Culture     Status: Abnormal   Collection Time: 09/29/24  2:17 PM   Specimen: Urine, Random  Result Value Ref Range Status   Specimen Description   Final    URINE, RANDOM Performed at [facility], [address], [place], [place] [address]    Special Requests   Final    NONE Reflexed from [id number] Performed at [facility], [address] [place], [place] [address]    Culture (A)  Final    >=100,000 COLONIES/mL PROTEUS MIRABILIS >=100,000 COLONIES/mL METHICILLIN RESISTANT STAPHYLOCOCCUS AUREUS  Report Status 10/02/2024 FINAL  Final   Organism ID, Bacteria PROTEUS MIRABILIS (A)  Final   Organism ID, Bacteria METHICILLIN RESISTANT STAPHYLOCOCCUS AUREUS (A)  Final      Susceptibility   Methicillin resistant staphylococcus aureus - MIC*    CIPROFLOXACIN  >=8 RESISTANT Resistant     GENTAMICIN <=0.5 SENSITIVE Sensitive     NITROFURANTOIN <=16 SENSITIVE Sensitive     OXACILLIN >=4 RESISTANT Resistant     TETRACYCLINE <=1 SENSITIVE Sensitive     VANCOMYCIN  1 SENSITIVE Sensitive     TRIMETH /SULFA  <=10 SENSITIVE Sensitive     RIFAMPIN  <=0.5 SENSITIVE Sensitive     Inducible Clindamycin NEGATIVE Sensitive     LINEZOLID  2 SENSITIVE Sensitive     * >=100,000 COLONIES/mL METHICILLIN RESISTANT STAPHYLOCOCCUS AUREUS   Proteus mirabilis -  MIC*    AMPICILLIN <=2 SENSITIVE Sensitive     CEFAZOLIN  (URINE) Value in next row Sensitive      4 SENSITIVEThis is a modified FDA-approved test that has been validated and its performance characteristics determined by the reporting laboratory.  This laboratory is certified under the Clinical Laboratory Improvement Amendments CLIA as qualified to perform high complexity clinical laboratory testing.    CEFEPIME  Value in next row Sensitive      4 SENSITIVEThis is a modified FDA-approved test that has been validated and its performance characteristics determined by the reporting laboratory.  This laboratory is certified under the Clinical Laboratory Improvement Amendments CLIA as qualified to perform high complexity clinical laboratory testing.    ERTAPENEM Value in next row Sensitive      4 SENSITIVEThis is a modified FDA-approved test that has been validated and its performance characteristics determined by the reporting laboratory.  This laboratory is certified under the Clinical Laboratory Improvement Amendments CLIA as qualified to perform high complexity clinical laboratory testing.    CEFTRIAXONE  Value in next row Sensitive      4 SENSITIVEThis is a modified FDA-approved test that has been validated and its performance characteristics determined by the reporting laboratory.  This laboratory is certified under the Clinical Laboratory Improvement Amendments CLIA as qualified to perform high complexity clinical laboratory testing.    CIPROFLOXACIN  Value in next row Resistant      4 SENSITIVEThis is a modified FDA-approved test that has been validated and its performance characteristics determined by the reporting laboratory.  This laboratory is certified under the Clinical Laboratory Improvement Amendments CLIA as qualified to perform high complexity clinical laboratory testing.    GENTAMICIN Value in next row Sensitive      4 SENSITIVEThis is a modified FDA-approved test that has been validated and  its performance characteristics determined by the reporting laboratory.  This laboratory is certified under the Clinical Laboratory Improvement Amendments CLIA as qualified to perform high complexity clinical laboratory testing.    NITROFURANTOIN Value in next row Resistant      4 SENSITIVEThis is a modified FDA-approved test that has been validated and its performance characteristics determined by the reporting laboratory.  This laboratory is certified under the Clinical Laboratory Improvement Amendments CLIA as qualified to perform high complexity clinical laboratory testing.    TRIMETH /SULFA  Value in next row Sensitive      4 SENSITIVEThis is a modified FDA-approved test that has been validated and its performance characteristics determined by the reporting laboratory.  This laboratory is certified under the Clinical Laboratory Improvement Amendments CLIA as qualified to perform high complexity clinical laboratory testing.    AMPICILLIN/SULBACTAM Value in next row Sensitive  4 SENSITIVEThis is a modified FDA-approved test that has been validated and its performance characteristics determined by the reporting laboratory.  This laboratory is certified under the Clinical Laboratory Improvement Amendments CLIA as qualified to perform high complexity clinical laboratory testing.    PIP/TAZO Value in next row Sensitive      <=4 SENSITIVEThis is a modified FDA-approved test that has been validated and its performance characteristics determined by the reporting laboratory.  This laboratory is certified under the Clinical Laboratory Improvement Amendments CLIA as qualified to perform high complexity clinical laboratory testing.    MEROPENEM Value in next row Sensitive      <=4 SENSITIVEThis is a modified FDA-approved test that has been validated and its performance characteristics determined by the reporting laboratory.  This laboratory is certified under the Clinical Laboratory Improvement Amendments CLIA  as qualified to perform high complexity clinical laboratory testing.    * >=100,000 COLONIES/mL PROTEUS MIRABILIS  Blood Culture (routine x 2)     Status: Abnormal   Collection Time: 09/29/24  2:37 PM   Specimen: BLOOD  Result Value Ref Range Status   Specimen Description   Final    BLOOD BLOOD RIGHT FOREARM Performed at [facility], [address] [place], [place] [address]    Special Requests   Final    BOTTLES DRAWN AEROBIC AND ANAEROBIC Blood Culture results may not be optimal due to an inadequate volume of blood received in culture bottles Performed at [facility], [address], [place], [place] [address]    Culture  Setup Time   Final    GRAM POSITIVE COCCI IN BOTH AEROBIC AND ANAEROBIC BOTTLES CRITICAL VALUE NOTED.  VALUE IS CONSISTENT WITH PREVIOUSLY REPORTED AND CALLED VALUE.    Culture (A)  Final    STAPHYLOCOCCUS AUREUS SUSCEPTIBILITIES PERFORMED ON PREVIOUS CULTURE WITHIN THE LAST 5 DAYS. Performed at [facility], [address], [place], [place] [address]    Report Status 10/02/2024 FINAL  Final  Blood Culture (routine x 2)     Status: Abnormal (Preliminary result)   Collection Time: 09/29/24  2:37 PM   Specimen: BLOOD  Result Value Ref Range Status   Specimen Description   Final    BLOOD LEFT ANTECUBITAL Performed at [facility], [address] [place], [place] [address]    Special Requests   Final    BOTTLES DRAWN AEROBIC AND ANAEROBIC Blood Culture results may not be optimal due to an inadequate volume of blood received in culture bottles Performed at [facility], [address], [place], [place] [address]    Culture  Setup Time   Final    GRAM POSITIVE COCCI IN BOTH AEROBIC AND ANAEROBIC BOTTLES CRITICAL RESULT CALLED TO, READ BACK BY AND VERIFIED WITH: PHARMD NATHAN B ON [id number] @0457  BY HNM    Culture (A)  Final    METHICILLIN RESISTANT STAPHYLOCOCCUS AUREUS Sent to [facility] for further susceptibility  testing. Performed at [facility], [address] [place], [place] [address]    Report Status PENDING  Incomplete   Organism ID, Bacteria METHICILLIN RESISTANT STAPHYLOCOCCUS AUREUS  Final      Susceptibility   Methicillin resistant staphylococcus aureus - MIC*    CIPROFLOXACIN  >=8 RESISTANT Resistant     ERYTHROMYCIN  >=8 RESISTANT Resistant     GENTAMICIN <=0.5 SENSITIVE Sensitive     OXACILLIN >=4 RESISTANT Resistant     TETRACYCLINE <=1 SENSITIVE Sensitive     VANCOMYCIN  1 SENSITIVE Sensitive  TRIMETH /SULFA  <=10 SENSITIVE Sensitive     CLINDAMYCIN >=8 RESISTANT Resistant     RIFAMPIN  <=0.5 SENSITIVE Sensitive     Inducible Clindamycin NEGATIVE Sensitive     LINEZOLID  2 SENSITIVE Sensitive     * METHICILLIN RESISTANT STAPHYLOCOCCUS AUREUS  Blood Culture ID Panel (Reflexed)     Status: Abnormal   Collection Time: 09/29/24  2:37 PM  Result Value Ref Range Status   Enterococcus faecalis NOT DETECTED NOT DETECTED Final   Enterococcus Faecium NOT DETECTED NOT DETECTED Final   Listeria monocytogenes NOT DETECTED NOT DETECTED Final   Staphylococcus species DETECTED (A) NOT DETECTED Final    Comment: CRITICAL RESULT CALLED TO, READ BACK BY AND VERIFIED WITHBETHA RANKIN DILLS PHARMD [id number] 09/30/24 HNM    Staphylococcus aureus (BCID) DETECTED (A) NOT DETECTED Final    Comment: Methicillin (oxacillin)-resistant Staphylococcus aureus (MRSA). MRSA is predictably resistant to beta-lactam antibiotics (except ceftaroline). Preferred therapy is vancomycin  unless clinically contraindicated. Patient requires contact precautions if  hospitalized. CRITICAL RESULT CALLED TO, READ BACK BY AND VERIFIED WITH: RANKIN DILLS PHARMD [id number] 09/30/24 HNM    Staphylococcus epidermidis NOT DETECTED NOT DETECTED Final   Staphylococcus lugdunensis NOT DETECTED NOT DETECTED Final   Streptococcus species NOT DETECTED NOT DETECTED Final   Streptococcus agalactiae NOT DETECTED NOT DETECTED Final   Streptococcus  pneumoniae NOT DETECTED NOT DETECTED Final   Streptococcus pyogenes NOT DETECTED NOT DETECTED Final   A.calcoaceticus-baumannii NOT DETECTED NOT DETECTED Final   Bacteroides fragilis NOT DETECTED NOT DETECTED Final   Enterobacterales NOT DETECTED NOT DETECTED Final   Enterobacter cloacae complex NOT DETECTED NOT DETECTED Final   Escherichia coli NOT DETECTED NOT DETECTED Final   Klebsiella aerogenes NOT DETECTED NOT DETECTED Final   Klebsiella oxytoca NOT DETECTED NOT DETECTED Final   Klebsiella pneumoniae NOT DETECTED NOT DETECTED Final   Proteus species NOT DETECTED NOT DETECTED Final   Salmonella species NOT DETECTED NOT DETECTED Final   Serratia marcescens NOT DETECTED NOT DETECTED Final   Haemophilus influenzae NOT DETECTED NOT DETECTED Final   Neisseria meningitidis NOT DETECTED NOT DETECTED Final   Pseudomonas aeruginosa NOT DETECTED NOT DETECTED Final   Stenotrophomonas maltophilia NOT DETECTED NOT DETECTED Final   Candida albicans NOT DETECTED NOT DETECTED Final   Candida auris NOT DETECTED NOT DETECTED Final   Candida glabrata NOT DETECTED NOT DETECTED Final   Candida krusei NOT DETECTED NOT DETECTED Final   Candida parapsilosis NOT DETECTED NOT DETECTED Final   Candida tropicalis NOT DETECTED NOT DETECTED Final   Cryptococcus neoformans/gattii NOT DETECTED NOT DETECTED Final   Meth resistant mecA/C and MREJ DETECTED (A) NOT DETECTED Final    Comment: CRITICAL RESULT CALLED TO, READ BACK BY AND VERIFIED WITHBETHA RANKIN DILLS PHARMD [id number] 09/30/24 HNM Performed at [facility], [address] [place], [place] [address]   MIC (1 Drug)-     Status: Abnormal (Preliminary result)   Collection Time: 09/29/24  2:37 PM  Result Value Ref Range Status   Min Inhibitory Conc (1 Drug) Preliminary report (A)  Final    Comment: (NOTE) Performed At: [facility] [address] [place], [place] [id number] Jennette Shorter MD Ey:[id number]    Source BLOOD  Final    Comment:  Performed at [facility], [address], [place], [place] [address]  MIC Result     Status: Abnormal   Collection Time: 09/29/24  2:37 PM  Result Value Ref Range Status   Result 1 (MIC) Comment (A)  Final  Comment: (NOTE) Methicillin - resistant Staphylococcus aureus Identification performed by account, not confirmed by this laboratory. Performed At: [facility] [address] [place], [place] [id number] Jennette Shorter MD Ey:[id number]   MRSA Next Gen by PCR, Nasal     Status: Abnormal   Collection Time: 09/30/24  5:00 PM   Specimen: Nasal Mucosa; Nasal Swab  Result Value Ref Range Status   MRSA by PCR Next Gen DETECTED (A) NOT DETECTED Final    Comment: RESULT CALLED TO, READ BACK BY AND VERIFIED WITH: TAMMY ORLANDO 2032 09/30/24 MU (NOTE) The GeneXpert MRSA Assay (FDA approved for NASAL specimens only), is one component of a comprehensive MRSA colonization surveillance program. It is not intended to diagnose MRSA infection nor to guide or monitor treatment for MRSA infections. Test performance is not FDA approved in patients less than 11 years old. Performed at [facility], [address] [place], [place] [address]   Cath Tip Culture     Status: Abnormal   Collection Time: 10/01/24 11:03 AM   Specimen: Catheter Tip; Other  Result Value Ref Range Status   Specimen Description   Final    CATH TIP Performed at [facility], [address] [place], [place] [address]    Special Requests   Final    NONE Performed at [facility], [address] [place], [place] [address]    Culture METHICILLIN RESISTANT STAPHYLOCOCCUS AUREUS (A)  Final   Report Status 10/03/2024 FINAL  Final   Organism ID, Bacteria METHICILLIN RESISTANT STAPHYLOCOCCUS AUREUS  Final      Susceptibility   Methicillin resistant staphylococcus aureus - MIC*    CIPROFLOXACIN  >=8 RESISTANT Resistant     ERYTHROMYCIN  >=8 RESISTANT Resistant     GENTAMICIN <=0.5  SENSITIVE Sensitive     OXACILLIN >=4 RESISTANT Resistant     TETRACYCLINE <=1 SENSITIVE Sensitive     VANCOMYCIN  <=0.5 SENSITIVE Sensitive     TRIMETH /SULFA  <=10 SENSITIVE Sensitive     CLINDAMYCIN >=8 RESISTANT Resistant     RIFAMPIN  <=0.5 SENSITIVE Sensitive     Inducible Clindamycin NEGATIVE Sensitive     LINEZOLID  2 SENSITIVE Sensitive     * METHICILLIN RESISTANT STAPHYLOCOCCUS AUREUS  Culture, blood (Routine X 2) w Reflex to ID Panel     Status: None (Preliminary result)   Collection Time: 10/02/24  6:36 AM   Specimen: BLOOD  Result Value Ref Range Status   Specimen Description BLOOD BLOOD LEFT ARM  Final   Special Requests   Final    BOTTLES DRAWN AEROBIC AND ANAEROBIC Blood Culture adequate volume   Culture   Final    NO GROWTH 4 DAYS Performed at [facility], [address], [place], [place] [address]    Report Status PENDING  Incomplete  Culture, blood (Routine X 2) w Reflex to ID Panel     Status: None (Preliminary result)   Collection Time: 10/02/24  6:36 AM   Specimen: BLOOD  Result Value Ref Range Status   Specimen Description BLOOD BLOOD LEFT ARM  Final   Special Requests   Final    BOTTLES DRAWN AEROBIC AND ANAEROBIC Blood Culture adequate volume   Culture   Final    NO GROWTH 4 DAYS Performed at [facility], [address] [place], [place] [address]    Report Status PENDING  Incomplete    Coagulation Studies: No results for input(s): LABPROT, INR in the last 72 hours.   Urinalysis: No results for input(s): COLORURINE,  LABSPEC, PHURINE, GLUCOSEU, HGBUR, BILIRUBINUR, KETONESUR, PROTEINUR, UROBILINOGEN, NITRITE, LEUKOCYTESUR in the last 72 hours.  Invalid input(s): APPERANCEUR     Imaging: PERIPHERAL VASCULAR CATHETERIZATION Result Date: 10/05/2024 See surgical note for result.    Medications:    anticoagulant sodium citrate       bisacodyl   10 mg Oral QHS   carvedilol   6.25 mg Oral BID WC    Chlorhexidine  Gluconate Cloth  6 each Topical Q0600   cyanocobalamin   1,000 mcg Oral Daily   diltiazem   240 mg Oral QPM   doxepin   10 mg Oral QHS   famotidine   10 mg Oral Daily   folic acid   1 mg Oral Daily   gabapentin   300 mg Oral QHS   heparin   5,000 Units Subcutaneous Q8H   insulin  aspart  0-5 Units Subcutaneous QHS   insulin  aspart  0-6 Units Subcutaneous TID WC   insulin  aspart  9 Units Subcutaneous TID WC   insulin  glargine  20 Units Subcutaneous BID   levothyroxine   175 mcg Oral QHS   linaclotide   145 mcg Oral QAC breakfast   loratadine   10 mg Oral Daily   metoCLOPramide  (REGLAN ) injection  5 mg Intravenous Q8H   nicotine   14 mg Transdermal Daily   pantoprazole   40 mg Oral Daily   polyethylene glycol  17 g Oral BID   senna-docusate  1 tablet Oral BID   simethicone   80 mg Oral QID   vancomycin  variable dose per unstable renal function (pharmacist dosing)   Does not apply See admin instructions   acetaminophen  **OR** acetaminophen , albuterol , alteplase , anticoagulant sodium citrate , bisacodyl , guaiFENesin , heparin , lactulose , liver oil-zinc  oxide, nicotine  polacrilex, ondansetron  **OR** ondansetron  (ZOFRAN ) IV, oxyCODONE , sodium phosphate   Assessment/ Plan:  Eugene [name] is a 64 y.o.  male with past medical conditions including diabetes, hepatitis C, bilateral BKA, anxiety and depression, and end-stage renal disease on hemodialysis.  Patient presents to the emergency department from his facility complaining of shortness of breath.  Patient is currently admitted for SIRS (systemic inflammatory response syndrome) (HCC) [R65.10]  [facility]/MWF/120.5 kg  End-stage renal disease on hemodialysis.        Last treatment received on 1/7, 30 min.  Due to infection, appreciate vascular removing right IJ PermCath on 1/8.  Received line free holiday. Vascular has placed lt internal jugular permcath.   Received dialysis yesterday, UF 1.9L achieved. Next treatment scheduled  for Wednesday. Primary team requesting CT chest with contrast to evaluate increased oxygen requirement, cleared to proceed  by nephology   Anemia of chronic kidney disease Lab Results  Component Value Date   HGB 9.8 (L) 10/06/2024   Hemoglobin 9.8, no current need for ESA  3.  Systemic inflammatory response syndrome, suspected due to fever, leukocytosis, and tachypnea on ED arrival.  Suspected source of suprapubic catheter. Abdominal x-ray shows moderate diffuse air distention of the bowel suspicious for ileus.  Blood culture positive for MRSA. Vascular removed PermCath on 1/8 and replaced on 1/12.  Continues to receive vancomycin  ordered per primary team.    4. Secondary Hyperparathyroidism: with outpatient labs: None available  Lab Results  Component Value Date   PTH 177 (H) 04/23/2024   CALCIUM  8.1 (L) 10/06/2024   PHOS 4.2 10/06/2024   S calcium  slowly improving.  Continue to monitor bone minerals this admission.   LOS: 7 Dannie Hattabaugh 1/13/202610:58 AM   "

## 2024-10-06 NOTE — TOC Progression Note (Addendum)
 Transition of Care [facility]) - Progression Note    Patient Details  Name: Eugene [name] MRN: [id number] Date of Birth: 11-Oct-1960  Transition of Care [facility]) CM/SW Contact  Dalia GORMAN Fuse, RN Phone Number: 10/06/2024, 10:50 AM  Clinical Narrative:    Continuing IV abx. Plan to dc back to LTC at [facility] when medically ready.  No TOC needs at this time.                     Expected Discharge Plan and Services                                               Social Drivers of Health (SDOH) Interventions SDOH Screenings   Food Insecurity: No Food Insecurity (09/29/2024)  Housing: Low Risk (09/29/2024)  Transportation Needs: No Transportation Needs (09/29/2024)  Utilities: Not At Risk (09/29/2024)  Social Connections: Socially Isolated (09/29/2024)  Tobacco Use: Medium Risk (09/29/2024)    Readmission Risk Interventions    05/05/2024    3:05 PM  Readmission Risk Prevention Plan  Transportation Screening Complete  Medication Review (RN Care Manager) Complete  PCP or Specialist appointment within 3-5 days of discharge Complete  SW Recovery Care/Counseling Consult Complete  Palliative Care Screening Not Applicable  Skilled Nursing Facility Complete

## 2024-10-06 NOTE — Evaluation (Signed)
 Occupational Therapy Evaluation Patient Details Name: Eugene [name] MRN: [id number] DOB: 1961-02-04 Today's Date: 10/06/2024   History of Present Illness   Pt is a 64 yo male that presented to the ED for fever, upset stomach. Workup for sepsis 2/2 MRSA. PMH of anxiety, depression, type 2 diabetes mellitus, history of hepatitis C, end-stage renal disease on dialysis, bilateral BKA, thoracic surgeries     Clinical Impressions Chart reviewed to date, pt greeted semi supine in bed, agreeable to OT evaluation. Pt is lethargic throughout. PTA Pt reports he requires assist for ADLs at facility, hoyer lift to [place] where he self propels. He does feed himself. Pt presents with deficits in strength, endurance, activity tolerance, balance, cognition, affecting safe and optimal ADL completion. Supervision required for rolling L<>R in bed. Transport then in room to transport pt to imaging. Anticipate supervision for grooming/feeding tasks, MAX-TOTAL A for toileting at bed level. Pt is left as received, all needs met. OT will follow acutely to facilitate optimal ADL/functional mobility engagement.   Pt on 6L via HFNC throughout, to 85% with rolling in bed. At completion of session, pt >90% on 6L via HFNC.     If plan is discharge home, recommend the following:   A lot of help with bathing/dressing/bathroom;A lot of help with walking and/or transfers     Functional Status Assessment   Patient has had a recent decline in their functional status and demonstrates the ability to make significant improvements in function in a reasonable and predictable amount of time.     Equipment Recommendations   Other (comment) (defer to next venue of care)     Recommendations for Other Services         Precautions/Restrictions   Precautions Precautions: Fall Recall of Precautions/Restrictions: Impaired Precaution/Restrictions Comments: old B BKA Restrictions Weight Bearing Restrictions Per Provider  Order: No     Mobility Bed Mobility Overal bed mobility: Needs Assistance Bed Mobility: Rolling Rolling: Supervision, Used rails         General bed mobility comments: roll both L<>R with use of rails    Transfers                          Balance                                           ADL either performed or assessed with clinical judgement   ADL Overall ADL's : Needs assistance/impaired Eating/Feeding: Bed level;Supervision/ safety   Grooming: Supervision/safety;Bed level       Lower Body Bathing: Maximal assistance;Total assistance           Toilet Transfer: Total assistance Toilet Transfer Details (indicate cue type and reason): anticipate Toileting- Clothing Manipulation and Hygiene: Maximal assistance;Total assistance;Bed level Toileting - Clothing Manipulation Details (indicate cue type and reason): anticipate             Vision Patient Visual Report: No change from baseline       Perception         Praxis         Pertinent Vitals/Pain Pain Assessment Pain Assessment: No/denies pain     Extremity/Trunk Assessment Upper Extremity Assessment Upper Extremity Assessment: Overall WFL for tasks assessed   Lower Extremity Assessment Lower Extremity Assessment: Defer to PT evaluation (old bilateral BKA)       Communication Communication Communication:  Impaired Factors Affecting Communication: Difficulty expressing self   Cognition Arousal: Lethargic Behavior During Therapy: Flat affect Cognition: No family/caregiver present to determine baseline, Cognition impaired   Orientation impairments: Situation     Attention impairment (select first level of impairment): Sustained attention Executive functioning impairment (select all impairments): Reasoning, Problem solving OT - Cognition Comments: will continue to assess                 Following commands: Impaired Following commands impaired: Follows  one step commands with increased time     Cueing  General Comments   Cueing Techniques: Verbal cues;Gestural cues;Tactile cues;Visual cues  pt to 85% on 6L via HFNC after rolling, >90% after approx 30 seconds PLB   Exercises Other Exercises Other Exercises: edu re role of OT, role of rehab   Shoulder Instructions      Home Living Family/patient expects to be discharged to:: Skilled nursing facility                                 Additional Comments: pt lives at [facility]      Prior Functioning/Environment Prior Level of Function : Needs assist             Mobility Comments: hoyer lift to [employer], pt reports he self propels after in [facility] ADLs Comments: assist for all ADLs including bathing/toileting at bed level; assist for IADLs    OT Problem List: Decreased strength;Decreased activity tolerance;Impaired balance (sitting and/or standing);Decreased safety awareness;Decreased cognition;Decreased knowledge of use of DME or AE;Cardiopulmonary status limiting activity   OT Treatment/Interventions: Self-care/ADL training;Therapeutic exercise;Energy conservation;DME and/or AE instruction;Therapeutic activities;Patient/family education;Balance training      OT Goals(Current goals can be found in the care plan section)   Acute Rehab OT Goals Patient Stated Goal: sleep OT Goal Formulation: With patient Time For Goal Achievement: 10/20/24 Potential to Achieve Goals: Good ADL Goals Pt Will Perform Grooming: with set-up;bed level Pt Will Transfer to Toilet: with total assist   OT Frequency:  Min 1X/week    Co-evaluation PT/OT/SLP Co-Evaluation/Treatment: Yes Reason for Co-Treatment: To address functional/ADL transfers   OT goals addressed during session: ADL's and self-care      AM-PAC OT 6 Clicks Daily Activity     Outcome Measure Help from another person eating meals?: None Help from another person taking care of personal grooming?: None Help from  another person toileting, which includes using toliet, bedpan, or urinal?: A Lot Help from another person bathing (including washing, rinsing, drying)?: A Lot Help from another person to put on and taking off regular upper body clothing?: A Little Help from another person to put on and taking off regular lower body clothing?: A Lot 6 Click Score: 17   End of Session Equipment Utilized During Treatment: Oxygen  Activity Tolerance: Patient tolerated treatment well Patient left: in bed;with call bell/phone within reach (in care of transport)  OT Visit Diagnosis: Other abnormalities of gait and mobility (R26.89);Muscle weakness (generalized) (M62.81)                Time: 8670-8662 OT Time Calculation (min): 8 min Charges:  OT General Charges $OT Visit: 1 Visit OT Evaluation $OT Eval Low Complexity: 1 Low  Therisa Sheffield, OTD OTR/L  10/06/2024, 1:56 PM

## 2024-10-06 NOTE — Progress Notes (Signed)
 " Progress Note   Patient: Eugene [name] FMW:[id number] DOB: 20-Sep-1961 DOA: 09/29/2024     7 DOS: the patient was seen and examined on 10/06/2024   Brief hospital course: HPI: Benard Minturn is a 64 y.o. male with medical history significant of anxiety, depression, type 2 diabetes mellitus, history of hepatitis C, end-stage renal disease on dialysis, bilateral BKA.  Patient known to most historian.  States he is short of breath.  He is sick to his stomach.  He vomited.  No diarrhea.  He is short of breath.  He is having a fever.  In the ER he was found to have an elevated white blood cell count and started on sepsis protocol.  Respiratory panel pending, chest x-ray shows mild interstitial edema and left greater than right pleural effusion.  Urine looks positive and patient has a suprapubic catheter.  Hospitalist services contacted for further evaluation.   Assessment and Plan: *Sepsis secondary to MRSA bacteremia Present on admission.  Sepsis criteria fever leukocytosis and tachycardia.  Now resolved.  MRSA positive in 4 out of 4 bottles. HD catheter removed 1/8 Afebrile overnight Repeat blood cultures no growth to date Continue vancomycin  -- Continue to follow blood cultures --Check CT of the chest given patient intermittently requiring 10 L of oxygen up from baseline of 6 L -- Will likely need 6 weeks of antibiotics given likely cannot undergo TEE  Normocytic anemia Thrombocytopenia improving Likely in settong of anemia of chronic disease and infection. H/o B12 deficiency Continue B12 supplementation, folate borderline, continue supplementation  Abdominal discomfort improving N/V  Unclear etiology.  Improved after a large bowel movement.  Possible component of gastroparesis.  CT of the abdomen pelvis with contrast notable for proctocolitis but no findings to suggest etiology of his nausea.   --Continue antiemetics, aggressive bowel regimen --Patient intermittently refusing PO reglan ,  will trial IV for a day --Continue zofran , some of nausea could have been related to multiple phenergan  doses.   ESRD (end stage renal disease) (HCC) Hemodialysis per nephrology  Paroxysmal SVT (supraventricular tachycardia) On Cardizem  and Coreg   Heart failure with preserved ejection fraction (HCC) Dialysis to manage fluid  Acute on chronic respiratory failure with hypoxia (HCC) resolved Patient oxygen level intermittently increased for comfort, he maintained appropriate oxygen saturations on 6 L.  An ABG was obtained after placing patient on 6 L of oxygen for 3 hours and his O2 saturation was 96%.  Patient currently refusing CPAP machine. Incentive spirometry Maintain O2 saturations greater than 92%  Asymptomatic bacteruria  Ucx with proteus mirabilis, and MRSA. MRSA due to MRSA bacteremia likely in setting of line infection.  Receiving anti MRSA antibiotics.   HTN Blood pressure remains slightly elevated. Continue to hold midodrine   Chronic bilateral pleural effusions Small.  Left greater than right.  Hd.   Chronic pain On oxycodone   Uncontrolled type 2 diabetes mellitus with hyperglycemia, with long-term current use of insulin  (HCC) A1c 7.3 though ESRD.  Remains poorly controlled will increase his Lantus  to 22 units twice daily and his mealtime insulin  to 9 units 3 times daily with meals.   chronic constipation Patient on numerous medications for constipation.  Had a bowel movement this a.m. --Continue with aggressive bowel regimen   Obesity (BMI 30-39.9) BMI 37.64 Complicates care  Hypothyroidism On levothyroxine        Subjective: Had some discomfort where her tunneled dialysis catheter placed.  Otherwise no acute events overnight.  Physical Exam: Vitals:   10/06/24 0001 10/06/24 0440 10/06/24 0852 10/06/24  1138  BP: 129/71 133/82 122/60 (!) 105/53  Pulse: 77 95 78 80  Resp: 16 16 19 19   Temp: 98.6 F (37 C) 98.7 F (37.1 C) 98.1 F (36.7 C) 98 F  (36.7 C)  TempSrc: Oral Oral  Oral  SpO2: 97% 95% 92% 91%  Weight:      Height:         Constitutional: In no distress.  Cardiovascular: Normal rate, regular rhythm. No lower extremity edema  Pulmonary: Non labored breathing on nasal cannula, no wheezing or rales.   Abdominal: Soft.  Obese non distended and non tender Musculoskeletal: Status post bilateral BKA's     Neurological: Alert and oriented to person, place, and time. Non focal  Skin: Skin is warm and dry.   Data Reviewed:     Latest Ref Rng & Units 10/06/2024    5:24 AM 10/05/2024    2:10 PM 10/04/2024    8:02 AM  BMP  Glucose 70 - 99 mg/dL 770  744  722   BUN 8 - 23 mg/dL 51  57  75   Creatinine 0.61 - 1.24 mg/dL 5.66  6.10  4.60   Sodium 135 - 145 mmol/L 136  137  137   Potassium 3.5 - 5.1 mmol/L 4.4  4.2  4.0   Chloride 98 - 111 mmol/L 102  103  105   CO2 22 - 32 mmol/L 25  24  22    Calcium  8.9 - 10.3 mg/dL 8.1  8.0  7.6       Latest Ref Rng & Units 10/06/2024    5:24 AM 10/04/2024    8:02 AM 10/03/2024    6:28 AM  CBC  WBC 4.0 - 10.5 K/uL 6.9  7.9  8.5   Hemoglobin 13.0 - 17.0 g/dL 9.8  9.8  89.9   Hematocrit 39.0 - 52.0 % 31.2  30.9  31.4   Platelets 150 - 400 K/uL 129  102  85      Family Communication: None at bedside.   Disposition: Status is: Inpatient Remains inpatient appropriate because: MRSA bacteremia, IV antibiotics, likely back to facility once definitive plan for antibiotics  Planned Discharge Destination: Pending clinical course     Time spent: 35 minutes  Author: Alban Pepper, MD 10/06/2024 2:43 PM  For on call review [email or web address].  "

## 2024-10-07 ENCOUNTER — Inpatient Hospital Stay

## 2024-10-07 DIAGNOSIS — R7881 Bacteremia: Secondary | ICD-10-CM | POA: Diagnosis not present

## 2024-10-07 DIAGNOSIS — D649 Anemia, unspecified: Secondary | ICD-10-CM | POA: Diagnosis not present

## 2024-10-07 DIAGNOSIS — R6510 Systemic inflammatory response syndrome (SIRS) of non-infectious origin without acute organ dysfunction: Secondary | ICD-10-CM | POA: Diagnosis not present

## 2024-10-07 DIAGNOSIS — Z794 Long term (current) use of insulin: Secondary | ICD-10-CM | POA: Diagnosis not present

## 2024-10-07 DIAGNOSIS — N319 Neuromuscular dysfunction of bladder, unspecified: Secondary | ICD-10-CM | POA: Diagnosis not present

## 2024-10-07 DIAGNOSIS — B1920 Unspecified viral hepatitis C without hepatic coma: Secondary | ICD-10-CM | POA: Diagnosis not present

## 2024-10-07 DIAGNOSIS — Z992 Dependence on renal dialysis: Secondary | ICD-10-CM | POA: Diagnosis not present

## 2024-10-07 DIAGNOSIS — B9562 Methicillin resistant Staphylococcus aureus infection as the cause of diseases classified elsewhere: Secondary | ICD-10-CM | POA: Diagnosis not present

## 2024-10-07 DIAGNOSIS — N186 End stage renal disease: Secondary | ICD-10-CM | POA: Diagnosis not present

## 2024-10-07 DIAGNOSIS — E1122 Type 2 diabetes mellitus with diabetic chronic kidney disease: Secondary | ICD-10-CM | POA: Diagnosis not present

## 2024-10-07 LAB — RENAL FUNCTION PANEL
Albumin: 2.5 g/dL — ABNORMAL LOW (ref 3.5–5.0)
Anion gap: 12 (ref 5–15)
BUN: 71 mg/dL — ABNORMAL HIGH (ref 8–23)
CO2: 21 mmol/L — ABNORMAL LOW (ref 22–32)
Calcium: 7.7 mg/dL — ABNORMAL LOW (ref 8.9–10.3)
Chloride: 99 mmol/L (ref 98–111)
Creatinine, Ser: 6.45 mg/dL — ABNORMAL HIGH (ref 0.61–1.24)
GFR, Estimated: 9 mL/min — ABNORMAL LOW
Glucose, Bld: 189 mg/dL — ABNORMAL HIGH (ref 70–99)
Phosphorus: 5.6 mg/dL — ABNORMAL HIGH (ref 2.5–4.6)
Potassium: 4.7 mmol/L (ref 3.5–5.1)
Sodium: 132 mmol/L — ABNORMAL LOW (ref 135–145)

## 2024-10-07 LAB — CBC WITH DIFFERENTIAL/PLATELET
Abs Immature Granulocytes: 0.09 K/uL — ABNORMAL HIGH (ref 0.00–0.07)
Basophils Absolute: 0 K/uL (ref 0.0–0.1)
Basophils Relative: 0 %
Eosinophils Absolute: 0.2 K/uL (ref 0.0–0.5)
Eosinophils Relative: 3 %
HCT: 27.6 % — ABNORMAL LOW (ref 39.0–52.0)
Hemoglobin: 8.7 g/dL — ABNORMAL LOW (ref 13.0–17.0)
Immature Granulocytes: 1 %
Lymphocytes Relative: 29 %
Lymphs Abs: 2 K/uL (ref 0.7–4.0)
MCH: 29.7 pg (ref 26.0–34.0)
MCHC: 31.5 g/dL (ref 30.0–36.0)
MCV: 94.2 fL (ref 80.0–100.0)
Monocytes Absolute: 0.6 K/uL (ref 0.1–1.0)
Monocytes Relative: 8 %
Neutro Abs: 4.1 K/uL (ref 1.7–7.7)
Neutrophils Relative %: 59 %
Platelets: 143 K/uL — ABNORMAL LOW (ref 150–400)
RBC: 2.93 MIL/uL — ABNORMAL LOW (ref 4.22–5.81)
RDW: 13.8 % (ref 11.5–15.5)
WBC: 7 K/uL (ref 4.0–10.5)
nRBC: 0 % (ref 0.0–0.2)

## 2024-10-07 LAB — GLUCOSE, CAPILLARY
Glucose-Capillary: 159 mg/dL — ABNORMAL HIGH (ref 70–99)
Glucose-Capillary: 165 mg/dL — ABNORMAL HIGH (ref 70–99)
Glucose-Capillary: 242 mg/dL — ABNORMAL HIGH (ref 70–99)
Glucose-Capillary: 233 mg/dL — ABNORMAL HIGH (ref 70–99)

## 2024-10-07 LAB — VITAMIN D 25 HYDROXY (VIT D DEFICIENCY, FRACTURES): Vit D, 25-Hydroxy: 21.8 ng/mL — ABNORMAL LOW (ref 30–100)

## 2024-10-07 LAB — CULTURE, BLOOD (ROUTINE X 2)
Culture: NO GROWTH
Culture: NO GROWTH
Special Requests: ADEQUATE
Special Requests: ADEQUATE

## 2024-10-07 LAB — IRON AND TIBC
Iron: 48 ug/dL (ref 45–182)
Saturation Ratios: 27 % (ref 17.9–39.5)
TIBC: 182 ug/dL — ABNORMAL LOW (ref 250–450)
UIBC: 134 ug/dL

## 2024-10-07 LAB — VITAMIN B12: Vitamin B-12: 962 pg/mL — ABNORMAL HIGH (ref 180–914)

## 2024-10-07 LAB — FOLATE: Folate: 13.8 ng/mL

## 2024-10-07 MED ORDER — ALBUMIN HUMAN 25 % IV SOLN
25.0000 g | Freq: Once | INTRAVENOUS | Status: AC
  Administered 2024-10-07: 25 g via INTRAVENOUS

## 2024-10-07 MED ORDER — HEPARIN SODIUM (PORCINE) 1000 UNIT/ML IJ SOLN
INTRAMUSCULAR | Status: AC
  Filled 2024-10-07: qty 5

## 2024-10-07 MED ORDER — CALCITRIOL 0.25 MCG PO CAPS
0.5000 ug | ORAL_CAPSULE | Freq: Every day | ORAL | Status: DC
  Administered 2024-10-07 – 2024-10-15 (×8): 0.5 ug via ORAL
  Filled 2024-10-07 (×10): qty 2

## 2024-10-07 MED ORDER — ALBUMIN HUMAN 25 % IV SOLN
INTRAVENOUS | Status: AC
  Filled 2024-10-07: qty 100

## 2024-10-07 NOTE — Progress Notes (Signed)
 "  Date of Admission:  09/29/2024      ID: Eugene [name] is a 64 y.o. male  Principal Problem:   SIRS (systemic inflammatory response syndrome) (HCC) Active Problems:   Uncontrolled type 2 diabetes mellitus with hyperglycemia, with long-term current use of insulin  (HCC)   Chronic pain   Abdominal discomfort   Hypothyroidism   Paroxysmal SVT (supraventricular tachycardia)   ESRD (end stage renal disease) (HCC)   Chronic respiratory failure with hypoxia (HCC)   Chronic bilateral pleural effusions   Heart failure with preserved ejection fraction (HCC)   Chronic constipation   Obesity (BMI 30-39.9)   MRSA bacteremia    Subjective: Getting dialysis No complaints    Medications:   bisacodyl   10 mg Oral QHS   carvedilol   6.25 mg Oral BID WC   Chlorhexidine  Gluconate Cloth  6 each Topical Q0600   cyanocobalamin   1,000 mcg Oral Daily   diltiazem   240 mg Oral QPM   doxepin   10 mg Oral QHS   famotidine   10 mg Oral Daily   folic acid   1 mg Oral Daily   gabapentin   300 mg Oral QHS   heparin   5,000 Units Subcutaneous Q8H   insulin  aspart  0-5 Units Subcutaneous QHS   insulin  aspart  0-6 Units Subcutaneous TID WC   insulin  aspart  9 Units Subcutaneous TID WC   insulin  glargine  22 Units Subcutaneous BID   levothyroxine   175 mcg Oral QHS   linaclotide   145 mcg Oral QAC breakfast   loratadine   10 mg Oral Daily   mupirocin  ointment  1 Application Nasal BID   nicotine   14 mg Transdermal Daily   pantoprazole   40 mg Oral Daily   polyethylene glycol  17 g Oral BID   senna-docusate  1 tablet Oral BID   simethicone   80 mg Oral QID    Objective: Vital signs in last 24 hours: Patient Vitals for the past 24 hrs:  BP Temp Temp src Pulse Resp SpO2  10/07/24 0932 124/63 98.7 F (37.1 C) Oral 77 16 91 %  10/07/24 0512 121/66 98.3 F (36.8 C) -- 80 18 96 %  10/06/24 1640 115/64 99.3 F (37.4 C) Oral 73 19 92 %  10/06/24 1138 (!) 105/53 98 F (36.7 C) Oral 80 19 91 %     PHYSICAL  EXAM:  General: alert- getting dialysis Lungs: Clear to auscultation bilaterally. No Wheezing or Rhonchi. No rales. Heart: Regular rate and rhythm, no murmur, rub or gallop. Abdomen: Soft, non-tender,not distended. Bowel sounds normal. No masses Extremities: b/l bka Skin: No rashes or lesions. Or bruising Neurologic: Grossly non-focal Left subclavian line Lab Results    Latest Ref Rng & Units 10/07/2024    4:49 AM 10/06/2024    5:24 AM 10/04/2024    8:02 AM  CBC  WBC 4.0 - 10.5 K/uL 7.0  6.9  7.9   Hemoglobin 13.0 - 17.0 g/dL 8.7  9.8  9.8   Hematocrit 39.0 - 52.0 % 27.6  31.2  30.9   Platelets 150 - 400 K/uL 143  129  102        Latest Ref Rng & Units 10/06/2024    5:24 AM 10/05/2024    2:10 PM 10/04/2024    8:02 AM  CMP  Glucose 70 - 99 mg/dL 770  744  722   BUN 8 - 23 mg/dL 51  57  75   Creatinine 0.61 - 1.24 mg/dL 5.66  6.10  4.60  Sodium 135 - 145 mmol/L 136  137  137   Potassium 3.5 - 5.1 mmol/L 4.4  4.2  4.0   Chloride 98 - 111 mmol/L 102  103  105   CO2 22 - 32 mmol/L 25  24  22    Calcium  8.9 - 10.3 mg/dL 8.1  8.0  7.6       Microbiology: 1/6 [facility] MRSA 1/8 cath tip MRSA 10/02/24 Bc NG 1/6 BC MRSA proteus Studies/Results: CT Angio Chest Pulmonary Embolism (PE) W or WO Contrast Result Date: 10/06/2024 CLINICAL DATA:  Possible sepsis.  Evaluate for pulmonary embolism. EXAM: CT ANGIOGRAPHY CHEST WITH CONTRAST TECHNIQUE: Multidetector CT imaging of the chest was performed using the standard protocol during bolus administration of intravenous contrast. Multiplanar CT image reconstructions and MIPs were obtained to evaluate the vascular anatomy. RADIATION DOSE REDUCTION: This exam was performed according to the departmental dose-optimization program which includes automated exposure control, adjustment of the mA and/or kV according to patient size and/or use of iterative reconstruction technique. CONTRAST:  OMNIPAQUE  IOHEXOL  350 MG/ML SOLN COMPARISON:  CT 06/23/2024,  05/11/2024 FINDINGS: Cardiovascular: Mild stable cardiomegaly. Mild calcified plaque over the left main and 3 vessel coronary arteries. Thoracic aorta is normal in caliber. Minimal calcified plaque over the thoracic aorta. Pulmonary arterial system is adequately opacified without definite pulmonary emboli. Remaining vascular structures are unremarkable. Mediastinum/Nodes: 1 cm right paratracheal lymph node likely reactive. No other concerning mediastinal or hilar adenopathy. Mediastinal structures are otherwise unremarkable. Lungs/Pleura: Small to moderate bilateral pleural effusions left greater than right with associated compressive atelectasis within the lung bases as these findings are slightly worse to unchanged. There is patchy hazy airspace attenuation over the mid to upper lungs with areas of mild nodularity new since the prior exam and may represent an acute infectious or inflammatory process. Obstruction of a left posterior basilar bronchus. Mild debris along the right lateral wall of the trachea. Upper Abdomen: No acute findings of the visualized upper abdomen. Musculoskeletal: Stable bilateral changes suggesting gynecomastia. Spinal stabilization hardware over the thoracic spine bridging corpectomy of 2 vertebral body levels over the lower thoracic spine. Review of the MIP images confirms the above findings. IMPRESSION: 1. No evidence of pulmonary embolism. 2. Small to moderate bilateral pleural effusions left greater than right with associated compressive atelectasis within the lung bases as these findings are slightly worse to unchanged. 3. Patchy hazy airspace attenuation over the mid to upper lungs with areas of mild nodularity new since the prior exam and may represent an acute infectious or inflammatory process. 4. Aortic atherosclerosis. Atherosclerotic coronary artery disease. Aortic Atherosclerosis (ICD10-I70.0). Electronically Signed   By: Toribio Agreste M.D.   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

## 2024-10-07 NOTE — Plan of Care (Signed)

## 2024-10-07 NOTE — Progress Notes (Signed)
 " [facility]  ROUNDING NOTE   Subjective:   Eugene [name] is a 64 year old male with past medical conditions including diabetes, hepatitis C, bilateral BKA, anxiety and depression, and end-stage renal disease on hemodialysis.  Patient presents to the emergency department from his facility complaining of shortness of breath.  Patient is currently admitted for SIRS (systemic inflammatory response syndrome) (HCC) [R65.10]  Patient is known to our practice and receives outpatient dialysis treatments at [facility] on a MWF schedule, supervised by Dr. Dennise.    Update:  Patient seen laying in bed Remains on 5.5L [place] Denies shortness of breath at this time   Objective:  Vital signs in last 24 hours:  Temp:  [98 F (36.7 C)-99.3 F (37.4 C)] 98.7 F (37.1 C) (01/14 0932) Pulse Rate:  [73-80] 77 (01/14 0932) Resp:  [16-19] 16 (01/14 0932) BP: (105-124)/(53-66) 124/63 (01/14 0932) SpO2:  [91 %-96 %] 91 % (01/14 0932)  Weight change:  Filed Weights   09/30/24 1043 10/05/24 0834 10/05/24 1306  Weight: 123.6 kg 123.6 kg 124.6 kg    Intake/Output: I/O last 3 completed shifts: In: 499.7 [IV Piggyback:499.7] Out: 650 [Urine:650]   Intake/Output this shift:  No intake/output data recorded.  Physical Exam: General: NAD  Head: Normocephalic  Eyes: Anicteric  Lungs:  Wheeze, 5.5 L HFNC  Heart: Regular rate and rhythm  Abdomen:  Soft, nontender  Extremities: Trace edema.  Bilateral BKA  Neurologic: Awake, alert, conversant  Skin: Warm,dry, no rash  Access: Lt internal jugular permcath    Basic Metabolic Panel: Recent Labs  Lab 10/02/24 0627 10/03/24 0628 10/04/24 0802 10/05/24 1410 10/06/24 0524  NA 140 138 137 137 136  K 4.0 4.3 4.0 4.2 4.4  CL 104 104 105 103 102  CO2 22 24 22 24 25   GLUCOSE 203* 293* 277* 255* 229*  BUN 66* 72* 75* 57* 51*  CREATININE 4.73* 5.15* 5.39* 3.89* 4.33*  CALCIUM  7.6* 7.6* 7.6* 8.0* 8.1*  MG  --   --  1.9  --   --    PHOS 5.0*  --   --   --  4.2    Liver Function Tests: Recent Labs  Lab 10/06/24 0524  ALBUMIN  2.6*   No results for input(s): LIPASE, AMYLASE in the last 168 hours.  No results for input(s): AMMONIA in the last 168 hours.  CBC: Recent Labs  Lab 10/01/24 1330 10/03/24 0628 10/04/24 0802 10/06/24 0524 10/07/24 0449  WBC 10.8* 8.5 7.9 6.9 7.0  NEUTROABS 9.3*  --   --  4.6 4.1  HGB 10.4* 10.0* 9.8* 9.8* 8.7*  HCT 32.3* 31.4* 30.9* 31.2* 27.6*  MCV 93.1 94.0 94.5 94.0 94.2  PLT 93* 85* 102* 129* 143*    Cardiac Enzymes: No results for input(s): CKTOTAL, CKMB, CKMBINDEX, TROPONINI in the last 168 hours.  BNP: Invalid input(s): POCBNP  CBG: Recent Labs  Lab 10/05/24 2147 10/06/24 0853 10/06/24 1222 10/06/24 1722 10/06/24 2104  GLUCAP 218* 267* 324* 248* 175*    Microbiology: Results for orders placed or performed during the hospital encounter of 09/29/24  Resp panel by RT-PCR (RSV, Flu A&B, Covid) Anterior Nasal Swab     Status: None   Collection Time: 09/29/24  2:16 PM   Specimen: Anterior Nasal Swab  Result Value Ref Range Status   SARS Coronavirus 2 by RT PCR NEGATIVE NEGATIVE Final    Comment: (NOTE) SARS-CoV-2 target nucleic acids are NOT DETECTED.  The SARS-CoV-2 RNA is generally detectable in upper  respiratory specimens during the acute phase of infection. The lowest concentration of SARS-CoV-2 viral copies this assay can detect is 138 copies/mL. A negative result does not preclude SARS-Cov-2 infection and should not be used as the sole basis for treatment or other patient management decisions. A negative result may occur with  improper specimen collection/handling, submission of specimen other than nasopharyngeal swab, presence of viral mutation(s) within the areas targeted by this assay, and inadequate number of viral copies(<138 copies/mL). A negative result must be combined with clinical observations, patient history, and  epidemiological information. The expected result is Negative.  Fact Sheet for Patients:  bloggercourse.com  Fact Sheet for Healthcare Providers:  seriousbroker.it  This test is no t yet approved or cleared by the [place]  FDA and  has been authorized for detection and/or diagnosis of SARS-CoV-2 by FDA under an Emergency Use Authorization (EUA). This EUA will remain  in effect (meaning this test can be used) for the duration of the COVID-19 declaration under Section 564(b)(1) of the Act, 21 U.S.C.section 360bbb-3(b)(1), unless the authorization is terminated  or revoked sooner.       Influenza A by PCR NEGATIVE NEGATIVE Final   Influenza B by PCR NEGATIVE NEGATIVE Final    Comment: (NOTE) The Xpert Xpress SARS-CoV-2/FLU/RSV plus assay is intended as an aid in the diagnosis of influenza from Nasopharyngeal swab specimens and should not be used as a sole basis for treatment. Nasal washings and aspirates are unacceptable for Xpert Xpress SARS-CoV-2/FLU/RSV testing.  Fact Sheet for Patients: bloggercourse.com  Fact Sheet for Healthcare Providers: seriousbroker.it  This test is not yet approved or cleared by the [place]  FDA and has been authorized for detection and/or diagnosis of SARS-CoV-2 by FDA under an Emergency Use Authorization (EUA). This EUA will remain in effect (meaning this test can be used) for the duration of the COVID-19 declaration under Section 564(b)(1) of the Act, 21 U.S.C. section 360bbb-3(b)(1), unless the authorization is terminated or revoked.     Resp Syncytial Virus by PCR NEGATIVE NEGATIVE Final    Comment: (NOTE) Fact Sheet for Patients: bloggercourse.com  Fact Sheet for Healthcare Providers: seriousbroker.it  This test is not yet approved or cleared by the [place]  FDA and has been  authorized for detection and/or diagnosis of SARS-CoV-2 by FDA under an Emergency Use Authorization (EUA). This EUA will remain in effect (meaning this test can be used) for the duration of the COVID-19 declaration under Section 564(b)(1) of the Act, 21 U.S.C. section 360bbb-3(b)(1), unless the authorization is terminated or revoked.  Performed at [facility], [address], [place], [place] [address]   Urine Culture     Status: Abnormal   Collection Time: 09/29/24  2:17 PM   Specimen: Urine, Random  Result Value Ref Range Status   Specimen Description   Final    URINE, RANDOM Performed at [facility], [address] [place], [place] [address]    Special Requests   Final    NONE Reflexed from [phone] Performed at [facility], [address] [place], [place] [address]    Culture (A)  Final    >=100,000 COLONIES/mL PROTEUS MIRABILIS >=100,000 COLONIES/mL METHICILLIN RESISTANT STAPHYLOCOCCUS AUREUS    Report Status 10/02/2024 FINAL  Final   Organism ID, Bacteria PROTEUS MIRABILIS (A)  Final   Organism ID, Bacteria METHICILLIN RESISTANT STAPHYLOCOCCUS AUREUS (A)  Final      Susceptibility   Methicillin resistant staphylococcus aureus - MIC*    CIPROFLOXACIN  >=8 RESISTANT  Resistant     GENTAMICIN <=0.5 SENSITIVE Sensitive     NITROFURANTOIN <=16 SENSITIVE Sensitive     OXACILLIN >=4 RESISTANT Resistant     TETRACYCLINE <=1 SENSITIVE Sensitive     VANCOMYCIN  1 SENSITIVE Sensitive     TRIMETH /SULFA  <=10 SENSITIVE Sensitive     RIFAMPIN  <=0.5 SENSITIVE Sensitive     Inducible Clindamycin NEGATIVE Sensitive     LINEZOLID  2 SENSITIVE Sensitive     * >=100,000 COLONIES/mL METHICILLIN RESISTANT STAPHYLOCOCCUS AUREUS   Proteus mirabilis - MIC*    AMPICILLIN <=2 SENSITIVE Sensitive     CEFAZOLIN  (URINE) Value in next row Sensitive      4 SENSITIVEThis is a modified FDA-approved test that has been validated and its performance characteristics determined  by the reporting laboratory.  This laboratory is certified under the Clinical Laboratory Improvement Amendments CLIA as qualified to perform high complexity clinical laboratory testing.    CEFEPIME  Value in next row Sensitive      4 SENSITIVEThis is a modified FDA-approved test that has been validated and its performance characteristics determined by the reporting laboratory.  This laboratory is certified under the Clinical Laboratory Improvement Amendments CLIA as qualified to perform high complexity clinical laboratory testing.    ERTAPENEM Value in next row Sensitive      4 SENSITIVEThis is a modified FDA-approved test that has been validated and its performance characteristics determined by the reporting laboratory.  This laboratory is certified under the Clinical Laboratory Improvement Amendments CLIA as qualified to perform high complexity clinical laboratory testing.    CEFTRIAXONE  Value in next row Sensitive      4 SENSITIVEThis is a modified FDA-approved test that has been validated and its performance characteristics determined by the reporting laboratory.  This laboratory is certified under the Clinical Laboratory Improvement Amendments CLIA as qualified to perform high complexity clinical laboratory testing.    CIPROFLOXACIN  Value in next row Resistant      4 SENSITIVEThis is a modified FDA-approved test that has been validated and its performance characteristics determined by the reporting laboratory.  This laboratory is certified under the Clinical Laboratory Improvement Amendments CLIA as qualified to perform high complexity clinical laboratory testing.    GENTAMICIN Value in next row Sensitive      4 SENSITIVEThis is a modified FDA-approved test that has been validated and its performance characteristics determined by the reporting laboratory.  This laboratory is certified under the Clinical Laboratory Improvement Amendments CLIA as qualified to perform high complexity clinical laboratory  testing.    NITROFURANTOIN Value in next row Resistant      4 SENSITIVEThis is a modified FDA-approved test that has been validated and its performance characteristics determined by the reporting laboratory.  This laboratory is certified under the Clinical Laboratory Improvement Amendments CLIA as qualified to perform high complexity clinical laboratory testing.    TRIMETH /SULFA  Value in next row Sensitive      4 SENSITIVEThis is a modified FDA-approved test that has been validated and its performance characteristics determined by the reporting laboratory.  This laboratory is certified under the Clinical Laboratory Improvement Amendments CLIA as qualified to perform high complexity clinical laboratory testing.    AMPICILLIN/SULBACTAM Value in next row Sensitive      4 SENSITIVEThis is a modified FDA-approved test that has been validated and its performance characteristics determined by the reporting laboratory.  This laboratory is certified under the Clinical Laboratory Improvement Amendments CLIA as qualified to perform high complexity clinical laboratory testing.    PIP/TAZO  Value in next row Sensitive      <=4 SENSITIVEThis is a modified FDA-approved test that has been validated and its performance characteristics determined by the reporting laboratory.  This laboratory is certified under the Clinical Laboratory Improvement Amendments CLIA as qualified to perform high complexity clinical laboratory testing.    MEROPENEM Value in next row Sensitive      <=4 SENSITIVEThis is a modified FDA-approved test that has been validated and its performance characteristics determined by the reporting laboratory.  This laboratory is certified under the Clinical Laboratory Improvement Amendments CLIA as qualified to perform high complexity clinical laboratory testing.    * >=100,000 COLONIES/mL PROTEUS MIRABILIS  Blood Culture (routine x 2)     Status: Abnormal   Collection Time: 09/29/24  2:37 PM   Specimen:  BLOOD  Result Value Ref Range Status   Specimen Description   Final    BLOOD BLOOD RIGHT FOREARM Performed at [facility], [address], [place], [place] [address]    Special Requests   Final    BOTTLES DRAWN AEROBIC AND ANAEROBIC Blood Culture results may not be optimal due to an inadequate volume of blood received in culture bottles Performed at [facility], [address], [place], [place] [address]    Culture  Setup Time   Final    GRAM POSITIVE COCCI IN BOTH AEROBIC AND ANAEROBIC BOTTLES CRITICAL VALUE NOTED.  VALUE IS CONSISTENT WITH PREVIOUSLY REPORTED AND CALLED VALUE.    Culture (A)  Final    STAPHYLOCOCCUS AUREUS SUSCEPTIBILITIES PERFORMED ON PREVIOUS CULTURE WITHIN THE LAST 5 DAYS. Performed at [facility], [address] [place], [place] [address]    Report Status 10/02/2024 FINAL  Final  Blood Culture (routine x 2)     Status: Abnormal (Preliminary result)   Collection Time: 09/29/24  2:37 PM   Specimen: BLOOD  Result Value Ref Range Status   Specimen Description   Final    BLOOD LEFT ANTECUBITAL Performed at [facility], [address] [place], [place] [address]    Special Requests   Final    BOTTLES DRAWN AEROBIC AND ANAEROBIC Blood Culture results may not be optimal due to an inadequate volume of blood received in culture bottles Performed at [facility], [address], [place], [place] [address]    Culture  Setup Time   Final    GRAM POSITIVE COCCI IN BOTH AEROBIC AND ANAEROBIC BOTTLES CRITICAL RESULT CALLED TO, READ BACK BY AND VERIFIED WITH: PHARMD NATHAN B ON [id number] @0457  BY HNM    Culture (A)  Final    METHICILLIN RESISTANT STAPHYLOCOCCUS AUREUS Sent to [facility] for further susceptibility testing. Performed at [facility], [address], [place], [place] [address]    Report Status PENDING  Incomplete   Organism ID, Bacteria METHICILLIN RESISTANT STAPHYLOCOCCUS AUREUS  Final       Susceptibility   Methicillin resistant staphylococcus aureus - MIC*    CIPROFLOXACIN  >=8 RESISTANT Resistant     ERYTHROMYCIN  >=8 RESISTANT Resistant     GENTAMICIN <=0.5 SENSITIVE Sensitive     OXACILLIN >=4 RESISTANT Resistant     TETRACYCLINE <=1 SENSITIVE Sensitive     VANCOMYCIN  1 SENSITIVE Sensitive     TRIMETH /SULFA  <=10 SENSITIVE Sensitive     CLINDAMYCIN >=8 RESISTANT Resistant     RIFAMPIN  <=0.5 SENSITIVE Sensitive     Inducible Clindamycin NEGATIVE Sensitive     LINEZOLID  2 SENSITIVE Sensitive     * METHICILLIN  RESISTANT STAPHYLOCOCCUS AUREUS  Blood Culture ID Panel (Reflexed)     Status: Abnormal   Collection Time: 09/29/24  2:37 PM  Result Value Ref Range Status   Enterococcus faecalis NOT DETECTED NOT DETECTED Final   Enterococcus Faecium NOT DETECTED NOT DETECTED Final   Listeria monocytogenes NOT DETECTED NOT DETECTED Final   Staphylococcus species DETECTED (A) NOT DETECTED Final    Comment: CRITICAL RESULT CALLED TO, READ BACK BY AND VERIFIED WITHBETHA RANKIN DILLS PHARMD [id number] 09/30/24 HNM    Staphylococcus aureus (BCID) DETECTED (A) NOT DETECTED Final    Comment: Methicillin (oxacillin)-resistant Staphylococcus aureus (MRSA). MRSA is predictably resistant to beta-lactam antibiotics (except ceftaroline). Preferred therapy is vancomycin  unless clinically contraindicated. Patient requires contact precautions if  hospitalized. CRITICAL RESULT CALLED TO, READ BACK BY AND VERIFIED WITH: RANKIN DILLS PHARMD [id number] 09/30/24 HNM    Staphylococcus epidermidis NOT DETECTED NOT DETECTED Final   Staphylococcus lugdunensis NOT DETECTED NOT DETECTED Final   Streptococcus species NOT DETECTED NOT DETECTED Final   Streptococcus agalactiae NOT DETECTED NOT DETECTED Final   Streptococcus pneumoniae NOT DETECTED NOT DETECTED Final   Streptococcus pyogenes NOT DETECTED NOT DETECTED Final   A.calcoaceticus-baumannii NOT DETECTED NOT DETECTED Final   Bacteroides fragilis NOT DETECTED NOT  DETECTED Final   Enterobacterales NOT DETECTED NOT DETECTED Final   Enterobacter cloacae complex NOT DETECTED NOT DETECTED Final   Escherichia coli NOT DETECTED NOT DETECTED Final   Klebsiella aerogenes NOT DETECTED NOT DETECTED Final   Klebsiella oxytoca NOT DETECTED NOT DETECTED Final   Klebsiella pneumoniae NOT DETECTED NOT DETECTED Final   Proteus species NOT DETECTED NOT DETECTED Final   Salmonella species NOT DETECTED NOT DETECTED Final   Serratia marcescens NOT DETECTED NOT DETECTED Final   Haemophilus influenzae NOT DETECTED NOT DETECTED Final   Neisseria meningitidis NOT DETECTED NOT DETECTED Final   Pseudomonas aeruginosa NOT DETECTED NOT DETECTED Final   Stenotrophomonas maltophilia NOT DETECTED NOT DETECTED Final   Candida albicans NOT DETECTED NOT DETECTED Final   Candida auris NOT DETECTED NOT DETECTED Final   Candida glabrata NOT DETECTED NOT DETECTED Final   Candida krusei NOT DETECTED NOT DETECTED Final   Candida parapsilosis NOT DETECTED NOT DETECTED Final   Candida tropicalis NOT DETECTED NOT DETECTED Final   Cryptococcus neoformans/gattii NOT DETECTED NOT DETECTED Final   Meth resistant mecA/C and MREJ DETECTED (A) NOT DETECTED Final    Comment: CRITICAL RESULT CALLED TO, READ BACK BY AND VERIFIED WITHBETHA RANKIN DILLS PHARMD 9542 09/30/24 HNM Performed at [facility], [address] [place], [place] [address]   MIC (1 Drug)-     Status: Abnormal (Preliminary result)   Collection Time: 09/29/24  2:37 PM  Result Value Ref Range Status   Min Inhibitory Conc (1 Drug) Preliminary report (A)  Final    Comment: (NOTE) Performed At: [facility] [address] [place], [place] [id number] Jennette Shorter MD Ey:[id number]    Source BLOOD  Final    Comment: Performed at [facility], [address] [place], [place] [address]  MIC Result     Status: Abnormal   Collection Time: 09/29/24  2:37 PM  Result Value Ref Range Status   Result 1 (MIC)  Comment (A)  Final    Comment: (NOTE) Methicillin - resistant Staphylococcus aureus Identification performed by account, not confirmed by this laboratory. Performed At: [facility] [address] [place], [place] [id number] Jennette Shorter MD Ey:[id number]   MRSA Next Gen by PCR, Nasal  Status: Abnormal   Collection Time: 09/30/24  5:00 PM   Specimen: Nasal Mucosa; Nasal Swab  Result Value Ref Range Status   MRSA by PCR Next Gen DETECTED (A) NOT DETECTED Final    Comment: RESULT CALLED TO, READ BACK BY AND VERIFIED WITH: TAMMY ORLANDO 2032 09/30/24 MU (NOTE) The GeneXpert MRSA Assay (FDA approved for NASAL specimens only), is one component of a comprehensive MRSA colonization surveillance program. It is not intended to diagnose MRSA infection nor to guide or monitor treatment for MRSA infections. Test performance is not FDA approved in patients less than 48 years old. Performed at [facility], [address], [place], [place] [address]   Cath Tip Culture     Status: Abnormal   Collection Time: 10/01/24 11:03 AM   Specimen: Catheter Tip; Other  Result Value Ref Range Status   Specimen Description   Final    CATH TIP Performed at [facility], [address] [place], [place] [address]    Special Requests   Final    NONE Performed at [facility], [address] [place], [place] [address]    Culture METHICILLIN RESISTANT STAPHYLOCOCCUS AUREUS (A)  Final   Report Status 10/03/2024 FINAL  Final   Organism ID, Bacteria METHICILLIN RESISTANT STAPHYLOCOCCUS AUREUS  Final      Susceptibility   Methicillin resistant staphylococcus aureus - MIC*    CIPROFLOXACIN  >=8 RESISTANT Resistant     ERYTHROMYCIN  >=8 RESISTANT Resistant     GENTAMICIN <=0.5 SENSITIVE Sensitive     OXACILLIN >=4 RESISTANT Resistant     TETRACYCLINE <=1 SENSITIVE Sensitive     VANCOMYCIN  <=0.5 SENSITIVE Sensitive     TRIMETH /SULFA  <=10 SENSITIVE Sensitive      CLINDAMYCIN >=8 RESISTANT Resistant     RIFAMPIN  <=0.5 SENSITIVE Sensitive     Inducible Clindamycin NEGATIVE Sensitive     LINEZOLID  2 SENSITIVE Sensitive     * METHICILLIN RESISTANT STAPHYLOCOCCUS AUREUS  Culture, blood (Routine X 2) w Reflex to ID Panel     Status: None   Collection Time: 10/02/24  6:36 AM   Specimen: BLOOD  Result Value Ref Range Status   Specimen Description BLOOD BLOOD LEFT ARM  Final   Special Requests   Final    BOTTLES DRAWN AEROBIC AND ANAEROBIC Blood Culture adequate volume   Culture   Final    NO GROWTH 5 DAYS Performed at [facility], [address] [place], [place] [address]    Report Status 10/07/2024 FINAL  Final  Culture, blood (Routine X 2) w Reflex to ID Panel     Status: None   Collection Time: 10/02/24  6:36 AM   Specimen: BLOOD  Result Value Ref Range Status   Specimen Description BLOOD BLOOD LEFT ARM  Final   Special Requests   Final    BOTTLES DRAWN AEROBIC AND ANAEROBIC Blood Culture adequate volume   Culture   Final    NO GROWTH 5 DAYS Performed at [facility], [address] [place], [place] [address]    Report Status 10/07/2024 FINAL  Final    Coagulation Studies: No results for input(s): LABPROT, INR in the last 72 hours.   Urinalysis: No results for input(s): COLORURINE, LABSPEC, PHURINE, GLUCOSEU, HGBUR, BILIRUBINUR, KETONESUR, PROTEINUR, UROBILINOGEN, NITRITE, LEUKOCYTESUR in the last 72 hours.  Invalid input(s): APPERANCEUR     Imaging: CT Angio Chest Pulmonary Embolism (PE) W or WO Contrast Result Date: 10/06/2024 CLINICAL DATA:  Possible sepsis.  Evaluate for  pulmonary embolism. EXAM: CT ANGIOGRAPHY CHEST WITH CONTRAST TECHNIQUE: Multidetector CT imaging of the chest was performed using the standard protocol during bolus administration of intravenous contrast. Multiplanar CT image reconstructions and MIPs were obtained to evaluate the vascular anatomy. RADIATION DOSE  REDUCTION: This exam was performed according to the departmental dose-optimization program which includes automated exposure control, adjustment of the mA and/or kV according to patient size and/or use of iterative reconstruction technique. CONTRAST:  OMNIPAQUE  IOHEXOL  350 MG/ML SOLN COMPARISON:  CT 06/23/2024, 05/11/2024 FINDINGS: Cardiovascular: Mild stable cardiomegaly. Mild calcified plaque over the left main and 3 vessel coronary arteries. Thoracic aorta is normal in caliber. Minimal calcified plaque over the thoracic aorta. Pulmonary arterial system is adequately opacified without definite pulmonary emboli. Remaining vascular structures are unremarkable. Mediastinum/Nodes: 1 cm right paratracheal lymph node likely reactive. No other concerning mediastinal or hilar adenopathy. Mediastinal structures are otherwise unremarkable. Lungs/Pleura: Small to moderate bilateral pleural effusions left greater than right with associated compressive atelectasis within the lung bases as these findings are slightly worse to unchanged. There is patchy hazy airspace attenuation over the mid to upper lungs with areas of mild nodularity new since the prior exam and may represent an acute infectious or inflammatory process. Obstruction of a left posterior basilar bronchus. Mild debris along the right lateral wall of the trachea. Upper Abdomen: No acute findings of the visualized upper abdomen. Musculoskeletal: Stable bilateral changes suggesting gynecomastia. Spinal stabilization hardware over the thoracic spine bridging corpectomy of 2 vertebral body levels over the lower thoracic spine. Review of the MIP images confirms the above findings. IMPRESSION: 1. No evidence of pulmonary embolism. 2. Small to moderate bilateral pleural effusions left greater than right with associated compressive atelectasis within the lung bases as these findings are slightly worse to unchanged. 3. Patchy hazy airspace attenuation over the mid to  upper lungs with areas of mild nodularity new since the prior exam and may represent an acute infectious or inflammatory process. 4. Aortic atherosclerosis. Atherosclerotic coronary artery disease. Aortic Atherosclerosis (ICD10-I70.0). Electronically Signed   By: Toribio Agreste M.D.   On: 10/06/2024 15:13     Medications:    anticoagulant sodium citrate      vancomycin       bisacodyl   10 mg Oral QHS   carvedilol   6.25 mg Oral BID WC   Chlorhexidine  Gluconate Cloth  6 each Topical Q0600   cyanocobalamin   1,000 mcg Oral Daily   diltiazem   240 mg Oral QPM   doxepin   10 mg Oral QHS   famotidine   10 mg Oral Daily   folic acid   1 mg Oral Daily   gabapentin   300 mg Oral QHS   heparin   5,000 Units Subcutaneous Q8H   insulin  aspart  0-5 Units Subcutaneous QHS   insulin  aspart  0-6 Units Subcutaneous TID WC   insulin  aspart  9 Units Subcutaneous TID WC   insulin  glargine  22 Units Subcutaneous BID   levothyroxine   175 mcg Oral QHS   linaclotide   145 mcg Oral QAC breakfast   loratadine   10 mg Oral Daily   mupirocin  ointment  1 Application Nasal BID   nicotine   14 mg Transdermal Daily   pantoprazole   40 mg Oral Daily   polyethylene glycol  17 g Oral BID   senna-docusate  1 tablet Oral BID   simethicone   80 mg Oral QID   acetaminophen  **OR** acetaminophen , albuterol , anticoagulant sodium citrate , bisacodyl , guaiFENesin , lactulose , liver oil-zinc  oxide, nicotine  polacrilex, ondansetron  **OR** ondansetron  (ZOFRAN ) IV,  oxyCODONE , sodium phosphate   Assessment/ Plan:  Eugene [name] is a 64 y.o.  male with past medical conditions including diabetes, hepatitis C, bilateral BKA, anxiety and depression, and end-stage renal disease on hemodialysis.  Patient presents to the emergency department from his facility complaining of shortness of breath.  Patient is currently admitted for SIRS (systemic inflammatory response syndrome) (HCC) [R65.10]  [facility] /MWF/120.5 kg  End-stage renal  disease on hemodialysis.        Last treatment received on 1/7, 30 min.  Due to infection, appreciate vascular removing right IJ PermCath on 1/8.  Received line free holiday. Vascular has placed lt internal jugular permcath.   Scheduled to receive dialysis later today.   Anemia of chronic kidney disease Lab Results  Component Value Date   HGB 8.7 (L) 10/07/2024   Hemoglobin 8.7, will order low dose Retacrit  with dialysis.   3.  Systemic inflammatory response syndrome, suspected due to fever, leukocytosis, and tachypnea on ED arrival.  Suspected source of suprapubic catheter. Abdominal x-ray shows moderate diffuse air distention of the bowel suspicious for ileus.  Blood culture positive for MRSA. Vascular removed PermCath on 1/8 and replaced on 1/12.  Continues to receive vancomycin  ordered per primary team.  ID consulted, awaiting updated recommendations for antibiotics at discharge.   4. Secondary Hyperparathyroidism: with outpatient labs: None available  Lab Results  Component Value Date   PTH 177 (H) 04/23/2024   CALCIUM  8.1 (L) 10/06/2024   PHOS 4.2 10/06/2024   Continue to monitor bone minerals this admission.   LOS: 8 Mikkel Charrette 1/14/202610:41 AM   "

## 2024-10-07 NOTE — TOC Progression Note (Signed)
 Transition of Care [facility]) - Progression Note    Patient Details  Name: Eugene [name] MRN: [id number] Date of Birth: August 08, 1961  [facility] CM/SW Contact  Dalia GORMAN Fuse, RN Phone Number: 10/07/2024, 10:38 AM  Clinical Narrative:     On O2 @ 5.5 L [place], down from 10. Continuing IV abx. Plan to return to LTC at [facility] when medically appropriate.                    Expected Discharge Plan and Services                                               Social Drivers of Health (SDOH) Interventions SDOH Screenings   Food Insecurity: No Food Insecurity (09/29/2024)  Housing: Low Risk (09/29/2024)  Transportation Needs: No Transportation Needs (09/29/2024)  Utilities: Not At Risk (09/29/2024)  Social Connections: Socially Isolated (09/29/2024)  Tobacco Use: Medium Risk (09/29/2024)    Readmission Risk Interventions    05/05/2024    3:05 PM  Readmission Risk Prevention Plan  Transportation Screening Complete  Medication Review (RN Care Manager) Complete  PCP or Specialist appointment within 3-5 days of discharge Complete  SW Recovery Care/Counseling Consult Complete  Palliative Care Screening Not Applicable  Skilled Nursing Facility Complete

## 2024-10-07 NOTE — Progress Notes (Signed)
 [facility] Progress Note  Patient: Eugene [name]    FMW:[id number]  DOA: 09/29/2024     Date of Service: the patient was seen and examined on 10/07/2024  Chief Complaint  Patient presents with   Shortness of Breath   Brief hospital course:  Eugene [name] is a 64 y.o. male with medical history significant of anxiety, depression, type 2 diabetes mellitus, history of hepatitis C, end-stage renal disease on dialysis, bilateral BKA.  Patient known to most historian.  States he is short of breath.  He is sick to his stomach.  He vomited.  No diarrhea.  He is short of breath.  He is having a fever.  In the ER he was found to have an elevated white blood cell count and started on sepsis protocol.  Respiratory panel pending, chest x-ray shows mild interstitial edema and left greater than right pleural effusion.  Urine looks positive and patient has a suprapubic catheter.  Hospitalist services contacted for further evaluation.      Assessment and Plan:  # Sepsis secondary to MRSA bacteremia Present on admission.  Sepsis criteria fever leukocytosis and tachycardia.  Now resolved.  MRSA positive in 4 out of 4 bottles. HD catheter removed 1/8 Afebrile overnight Repeat blood cultures no growth to date Continue vancomycin  -- Continue to follow blood cultures --CT of the chest given patient intermittently requiring 10 L of oxygen up from baseline of 6 L 1/13 CT Chest negative for PE, bilateral pleural effusion, mild to moderate.  Atelectasis -- Will likely need 6 weeks of antibiotics given likely cannot undergo TEE 1/14 cardiology consulted to eval for TEE  Normocytic anemia Thrombocytopenia improving Likely in settong of anemia of chronic disease and infection. H/o B12 deficiency Continue B12 supplementation, folate borderline, continue supplementation   Abdominal discomfort improving N/V  Unclear etiology.  Improved after a large bowel movement.  Possible component of gastroparesis.  CT  of the abdomen pelvis with contrast notable for proctocolitis but no findings to suggest etiology of his nausea.   --Continue antiemetics, aggressive bowel regimen --Patient intermittently refusing PO reglan , will trial IV for a day --Continue zofran , some of nausea could have been related to multiple phenergan  doses.  1/14 follow KUB to rule out small bowel obstruction    ESRD (end stage renal disease) (HCC) Hemodialysis per nephrology   Paroxysmal SVT (supraventricular tachycardia) On Cardizem  and Coreg    Heart failure with preserved ejection fraction (HCC) Dialysis to manage fluid   Acute on chronic respiratory failure with hypoxia (HCC) resolved Patient oxygen level intermittently increased for comfort, he maintained appropriate oxygen saturations on 6 L.  An ABG was obtained after placing patient on 6 L of oxygen for 3 hours and his O2 saturation was 96%.  Patient currently refusing CPAP machine. Incentive spirometry Maintain O2 saturations greater than 92%   Asymptomatic bacteruria  Ucx with proteus mirabilis, and MRSA. MRSA due to MRSA bacteremia likely in setting of line infection.  Receiving anti MRSA antibiotics.    HTN Blood pressure remains slightly elevated. Continue to hold midodrine    Chronic bilateral pleural effusions Small.  Left greater than right.  Hd.    Chronic pain On oxycodone    Uncontrolled type 2 diabetes mellitus with hyperglycemia, with long-term current use of insulin  (HCC) A1c 7.3 though ESRD.  Remains poorly controlled will increase his Lantus  to 22 units twice daily and his mealtime insulin  to 9 units 3 times daily with meals.     chronic constipation Patient on numerous medications  for constipation.  Had a bowel movement this a.m. --Continue with aggressive bowel regimen     Obesity (BMI 30-39.9) BMI 37.64 Complicates care   Hypothyroidism On levothyroxine    Vitamin D  deficiency: Started Calcitrol 0.5 mcg p.o. daily. follow with PCP  to repeat vitamin D  level in between 3 to 6 months.   Body mass index is 39.89 kg/m.  Interventions:  Wound 09/29/24 1832 Pressure Injury Buttocks Right;Left Stage 2 -  Partial thickness loss of dermis presenting as a shallow open injury with a red, pink wound bed without slough. (Active)     Diet: Carb modified diet DVT Prophylaxis: Subcutaneous Heparin     Advance goals of care discussion: Full code  Family Communication: family was not present at bedside, at the time of interview.  The pt provided permission to discuss medical plan with the family. Opportunity was given to ask question and all questions were answered satisfactorily.   Disposition:  Pt is from SNF LTC, admitted with MRSA bacteremia, still workup pending, patient need TEE, which precludes a safe discharge. Discharge back to LTC, when stable and cleared by ID.  May need few days to improve, TEE is pending  Subjective: No significant events overnight.  Patient was seen during hemodialysis, patient was complaining of feeling nausea, no vomiting.  Complaining of stomachache, generalized, no specific location Denies any chest pain or palpitation, no any other complaints  Physical Exam: General: NAD, lying comfortably Appear in no distress, affect appropriate Eyes: PERRLA ENT: Oral Mucosa Clear, moist  Neck: no JVD,  Cardiovascular: S1 and S2 Present, no Murmur,  Respiratory: good respiratory effort, Bilateral Air entry equal and Decreased, no Crackles, no wheezes Abdomen: BS present, Soft, obese, distended, generalized tenderness Skin: no rashes Extremities: no Pedal edema, no calf tenderness Neurologic: without any new focal findings Gait not checked due to patient safety concerns  Vitals:   10/07/24 1600 10/07/24 1615 10/07/24 1630 10/07/24 1700  BP: (!) 110/55 (!) 105/50 (!) 107/52 (!) 96/53  Pulse: 76 71 71 73  Resp: (!) 21 17 18 18   Temp:      TempSrc:      SpO2: 92% 92% 94% 93%  Weight:      Height:        No intake or output data in the 24 hours ending 10/07/24 1702 Filed Weights   10/05/24 0834 10/05/24 1306 10/07/24 1316  Weight: 123.6 kg 124.6 kg 126.1 kg    Data Reviewed: I have personally reviewed and interpreted daily labs, tele strips, imagings as discussed above. I reviewed all nursing notes, pharmacy notes, vitals, pertinent old records I have discussed plan of care as described above with RN and patient/family.  CBC: Recent Labs  Lab 10/01/24 1330 10/03/24 0628 10/04/24 0802 10/06/24 0524 10/07/24 0449  WBC 10.8* 8.5 7.9 6.9 7.0  NEUTROABS 9.3*  --   --  4.6 4.1  HGB 10.4* 10.0* 9.8* 9.8* 8.7*  HCT 32.3* 31.4* 30.9* 31.2* 27.6*  MCV 93.1 94.0 94.5 94.0 94.2  PLT 93* 85* 102* 129* 143*   Basic Metabolic Panel: Recent Labs  Lab 10/02/24 0627 10/03/24 0628 10/04/24 0802 10/05/24 1410 10/06/24 0524 10/07/24 1430  NA 140 138 137 137 136 132*  K 4.0 4.3 4.0 4.2 4.4 4.7  CL 104 104 105 103 102 99  CO2 22 24 22 24 25  21*  GLUCOSE 203* 293* 277* 255* 229* 189*  BUN 66* 72* 75* 57* 51* 71*  CREATININE 4.73* 5.15* 5.39* 3.89* 4.33* 6.45*  CALCIUM  7.6* 7.6* 7.6* 8.0* 8.1* 7.7*  MG  --   --  1.9  --   --   --   PHOS 5.0*  --   --   --  4.2 5.6*    Studies: No results found.  Scheduled Meds:  bisacodyl   10 mg Oral QHS   carvedilol   6.25 mg Oral BID WC   Chlorhexidine  Gluconate Cloth  6 each Topical Q0600   cyanocobalamin   1,000 mcg Oral Daily   diltiazem   240 mg Oral QPM   doxepin   10 mg Oral QHS   famotidine   10 mg Oral Daily   folic acid   1 mg Oral Daily   gabapentin   300 mg Oral QHS   heparin   5,000 Units Subcutaneous Q8H   insulin  aspart  0-5 Units Subcutaneous QHS   insulin  aspart  0-6 Units Subcutaneous TID WC   insulin  aspart  9 Units Subcutaneous TID WC   insulin  glargine  22 Units Subcutaneous BID   levothyroxine   175 mcg Oral QHS   linaclotide   145 mcg Oral QAC breakfast   loratadine   10 mg Oral Daily   mupirocin  ointment  1 Application  Nasal BID   nicotine   14 mg Transdermal Daily   pantoprazole   40 mg Oral Daily   polyethylene glycol  17 g Oral BID   senna-docusate  1 tablet Oral BID   simethicone   80 mg Oral QID   Continuous Infusions:  anticoagulant sodium citrate      vancomycin      PRN Meds: acetaminophen  **OR** acetaminophen , albuterol , anticoagulant sodium citrate , bisacodyl , guaiFENesin , lactulose , liver oil-zinc  oxide, nicotine  polacrilex, ondansetron  **OR** ondansetron  (ZOFRAN ) IV, oxyCODONE , sodium phosphate   Time spent: 55 minutes  Author: ELVAN SOR. MD [facility] 10/07/2024 5:02 PM  To reach On-call, see care teams to locate the attending and reach out to them via [email or web address]. If 7PM-7AM, please contact night-coverage If you still have difficulty reaching the attending provider, please page the [facility] (Director on Call) for [facility] on amion for assistance.

## 2024-10-07 NOTE — Progress Notes (Signed)
 Hemodialysis Note:  Received patient in bed to unit. Alert and oriented. Informed consent singed and in chart.  Treatment initiated: 1330 Treatment completed: 1745  Access used: Left internal jugular catheter Access issues: None  100 ml saline bolus given during treatment due to hypotension.Transported back to room, alert without acute distress. Report given to patient's RN.  Total UF removed: 2.4 Liters Medications given: Albumin  25 gm IV  Post HD weight: 123.7 Kg  [facility]

## 2024-10-08 DIAGNOSIS — R6510 Systemic inflammatory response syndrome (SIRS) of non-infectious origin without acute organ dysfunction: Secondary | ICD-10-CM | POA: Diagnosis not present

## 2024-10-08 LAB — BASIC METABOLIC PANEL WITH GFR
Anion gap: 12 (ref 5–15)
BUN: 43 mg/dL — ABNORMAL HIGH (ref 8–23)
CO2: 25 mmol/L (ref 22–32)
Calcium: 7.9 mg/dL — ABNORMAL LOW (ref 8.9–10.3)
Chloride: 96 mmol/L — ABNORMAL LOW (ref 98–111)
Creatinine, Ser: 4.78 mg/dL — ABNORMAL HIGH (ref 0.61–1.24)
GFR, Estimated: 13 mL/min — ABNORMAL LOW
Glucose, Bld: 208 mg/dL — ABNORMAL HIGH (ref 70–99)
Potassium: 4.3 mmol/L (ref 3.5–5.1)
Sodium: 132 mmol/L — ABNORMAL LOW (ref 135–145)

## 2024-10-08 LAB — CBC
HCT: 27.2 % — ABNORMAL LOW (ref 39.0–52.0)
Hemoglobin: 8.5 g/dL — ABNORMAL LOW (ref 13.0–17.0)
MCH: 29.3 pg (ref 26.0–34.0)
MCHC: 31.3 g/dL (ref 30.0–36.0)
MCV: 93.8 fL (ref 80.0–100.0)
Platelets: 149 K/uL — ABNORMAL LOW (ref 150–400)
RBC: 2.9 MIL/uL — ABNORMAL LOW (ref 4.22–5.81)
RDW: 14 % (ref 11.5–15.5)
WBC: 6.1 K/uL (ref 4.0–10.5)
nRBC: 0 % (ref 0.0–0.2)

## 2024-10-08 LAB — GLUCOSE, CAPILLARY
Glucose-Capillary: 161 mg/dL — ABNORMAL HIGH (ref 70–99)
Glucose-Capillary: 132 mg/dL — ABNORMAL HIGH (ref 70–99)
Glucose-Capillary: 144 mg/dL — ABNORMAL HIGH (ref 70–99)
Glucose-Capillary: 189 mg/dL — ABNORMAL HIGH (ref 70–99)

## 2024-10-08 LAB — PHOSPHORUS: Phosphorus: 4.2 mg/dL (ref 2.5–4.6)

## 2024-10-08 LAB — MAGNESIUM: Magnesium: 1.9 mg/dL (ref 1.7–2.4)

## 2024-10-08 NOTE — NC FL2 (Signed)
 " [place]  MEDICAID FL2 LEVEL OF CARE FORM     IDENTIFICATION  Patient Name: Eugene [name] Birthdate: 03-19-1961 Sex: male Admission Date (Current Location): 09/29/2024  [facility] and [place] Number:  Chiropodist and Address:  [facility], [address] [place], [place] [address]      Provider Number: [id number]  Attending Physician Name and Address:  Von Bellis, MD  Relative Name and Phone Number:  [place], [place], Emergency Contact  [phone]    Current Level of Care: Hospital Recommended Level of Care: Skilled Nursing Facility Prior Approval Number:    Date Approved/Denied:   PASRR Number: [id number]  Discharge Plan: SNF    Current Diagnoses: Patient Active Problem List   Diagnosis Date Noted   MRSA bacteremia 10/01/2024   SIRS (systemic inflammatory response syndrome) (HCC) 09/29/2024   Chronic respiratory failure with hypoxia (HCC) 09/29/2024   Chronic bilateral pleural effusions 09/29/2024   Heart failure with preserved ejection fraction (HCC) 09/29/2024   Chronic constipation 09/29/2024   Obesity (BMI 30-39.9) 09/29/2024   ESRD needing dialysis (HCC) 08/03/2024   Severe sepsis with acute organ dysfunction (HCC) 06/22/2024   Obesity, Class III, BMI 40-49.9 (morbid obesity) (HCC) 06/22/2024   Elevated troponin 06/22/2024   Paroxysmal SVT (supraventricular tachycardia) 05/25/2024   ESRD (end stage renal disease) (HCC) 05/25/2024   Hypoxia 05/25/2024   Thrombocytopenia 05/24/2024   Class 3 obesity (HCC) 05/22/2024   Bronchospasm 05/22/2024   Anasarca associated with disorder of kidney 05/09/2024   Type 2 diabetes mellitus with peripheral neuropathy (HCC) 05/03/2024   Hypothyroidism 05/03/2024   IBS (irritable bowel syndrome) 05/03/2024   Acute on chronic diastolic CHF (congestive heart failure) (HCC) 05/03/2024   CKD (chronic kidney disease) stage 4, GFR 15-29 ml/min (HCC) 05/03/2024   Anaphylaxis 05/02/2024    Ileus (HCC) 04/23/2024   Acute respiratory failure with hypoxia (HCC) 04/19/2024   Acid-base disorder, mixed 04/16/2024   Acute kidney injury superimposed on CKD 04/15/2024   Diabetic gastroparesis (HCC) 04/17/2021   Suprapubic catheter dysfunction    Chronic pain    Constipation by delayed colonic transit    Nausea and vomiting 10/21/2019   Suprapubic catheter (HCC)    Sepsis (HCC) 10/02/2019   Thoracic spinal stenosis 02/12/2019   Osteomyelitis of thoracic spine (HCC) 02/08/2019   Peripheral vascular disease 02/08/2019   HTN (hypertension) 02/08/2019   Dyslipidemia 02/08/2019   Hyperlipidemia associated with type 2 diabetes mellitus (HCC) 02/08/2019   Polysubstance abuse (HCC) 02/08/2019   Chronic hepatitis C without hepatic coma (HCC) 02/08/2019   GERD (gastroesophageal reflux disease) 02/08/2019   Cigarette smoker 02/08/2019   Normocytic anemia 02/08/2019   Catheter-associated urinary tract infection 02/07/2019   AKI (acute kidney injury) 02/07/2019   Hyperkalemia 02/07/2019   Pressure injury of skin 02/07/2019   Hyperlipidemia 11/07/2018   Drug-seeking behavior 11/07/2018   Abnormality of gait 10/17/2018   S/P bilateral BKA (below knee amputation) (HCC) 10/17/2018   Abdominal discomfort 03/24/2018   Acute pain of left knee 01/23/2018   MRSA (methicillin resistant Staphylococcus aureus) septicemia (HCC) 08/17/2017   Bacteremia due to Staphylococcus aureus 08/15/2017   Other acute osteomyelitis, left ankle and foot (HCC) 06/29/2017   Diabetic ulcer of right foot associated with diabetes mellitus due to underlying condition (HCC) 06/27/2017   Cellulitis of left foot 06/27/2017   Tobacco use disorder 06/27/2017   Uncontrolled type 2 diabetes mellitus with hyperglycemia, with long-term current use of insulin  (HCC) 06/18/2017    Orientation RESPIRATION BLADDER Height &  Weight     Self, Time, Situation, Place    External catheter Weight: 123.7 kg Height:  5' 10 (177.8 cm)   BEHAVIORAL SYMPTOMS/MOOD NEUROLOGICAL BOWEL NUTRITION STATUS      Continent Diet (Heart Healthy Carb)  AMBULATORY STATUS COMMUNICATION OF NEEDS Skin   Limited Assist Verbally                         Personal Care Assistance Level of Assistance  Bathing, Feeding, Dressing Bathing Assistance: Limited assistance Feeding assistance: Independent Dressing Assistance: Limited assistance     Functional Limitations Info             SPECIAL CARE FACTORS FREQUENCY                       Contractures      Additional Factors Info  Code Status, Allergies Code Status Info: FULL Allergies Info: NKA           Current Medications (10/08/2024):  This is the current hospital active medication list Current Facility-Administered Medications  Medication Dose Route Frequency Provider Last Rate Last Admin   acetaminophen  (TYLENOL ) tablet 650 mg  650 mg Oral Q6H PRN Dew, Jason S, MD   650 mg at 10/07/24 1036   Or   acetaminophen  (TYLENOL ) suppository 650 mg  650 mg Rectal Q6H PRN Dew, Jason S, MD       albuterol  (PROVENTIL ) (2.5 MG/3ML) 0.083% nebulizer solution 2.5 mg  2.5 mg Nebulization Q4H PRN Dew, Jason S, MD   2.5 mg at 10/08/24 9046   anticoagulant sodium citrate  solution 5 mL  5 mL Intracatheter PRN Marea Selinda RAMAN, MD       bisacodyl  (DULCOLAX) EC tablet 10 mg  10 mg Oral QHS Dew, Jason S, MD   10 mg at 10/07/24 2044   bisacodyl  (DULCOLAX) suppository 10 mg  10 mg Rectal Daily PRN Dew, Jason S, MD   10 mg at 10/01/24 1239   calcitRIOL  (ROCALTROL ) capsule 0.5 mcg  0.5 mcg Oral Daily Von Bellis, MD   0.5 mcg at 10/08/24 0945   carvedilol  (COREG ) tablet 6.25 mg  6.25 mg Oral BID WC Dew, Jason S, MD   6.25 mg at 10/08/24 0945   Chlorhexidine  Gluconate Cloth 2 % PADS 6 each  6 each Topical Q0600 Dew, Jason S, MD   6 each at 10/08/24 0606   cyanocobalamin  (VITAMIN B12) tablet 1,000 mcg  1,000 mcg Oral Daily Dew, Jason S, MD   1,000 mcg at 10/08/24 0944   diltiazem  (CARDIZEM  CD)  24 hr capsule 240 mg  240 mg Oral QPM Dew, Jason S, MD   240 mg at 10/07/24 2046   doxepin  (SINEQUAN ) capsule 10 mg  10 mg Oral QHS Dew, Jason S, MD   10 mg at 10/07/24 2246   famotidine  (PEPCID ) tablet 10 mg  10 mg Oral Daily Dew, Jason S, MD   10 mg at 10/08/24 0945   folic acid  (FOLVITE ) tablet 1 mg  1 mg Oral Daily Dew, Jason S, MD   1 mg at 10/08/24 0944   gabapentin  (NEURONTIN ) capsule 300 mg  300 mg Oral QHS Dew, Jason S, MD   300 mg at 10/07/24 2045   guaiFENesin  (ROBITUSSIN) 100 MG/5ML liquid 200 mg  200 mg Oral TID PRN Dew, Jason S, MD   200 mg at 10/08/24 0944   heparin  injection 5,000 Units  5,000 Units Subcutaneous Q8H Dew, Selinda RAMAN, MD  5,000 Units at 10/08/24 0600   insulin  aspart (novoLOG ) injection 0-5 Units  0-5 Units Subcutaneous QHS Dew, Jason S, MD   2 Units at 10/07/24 2202   insulin  aspart (novoLOG ) injection 0-6 Units  0-6 Units Subcutaneous TID WC Dew, Jason S, MD   1 Units at 10/08/24 0946   insulin  aspart (novoLOG ) injection 9 Units  9 Units Subcutaneous TID WC Franchot Novel, MD   9 Units at 10/08/24 1255   insulin  glargine (LANTUS ) injection 22 Units  22 Units Subcutaneous BID Franchot Novel, MD   22 Units at 10/08/24 0945   levothyroxine  (SYNTHROID ) tablet 175 mcg  175 mcg Oral QHS Dew, Jason S, MD   175 mcg at 10/07/24 2045   linaclotide  (LINZESS ) capsule 145 mcg  145 mcg Oral QAC breakfast Dew, Jason S, MD   145 mcg at 10/08/24 0945   liver oil-zinc  oxide (DESITIN) 40 % ointment 1 Application  1 Application Topical PRN Marea Selinda RAMAN, MD       loratadine  (CLARITIN ) tablet 10 mg  10 mg Oral Daily Dew, Jason S, MD   10 mg at 10/08/24 0944   mupirocin  ointment (BACTROBAN ) 2 % 1 Application  1 Application Nasal BID Franchot Novel, MD   1 Application at 10/08/24 9053   nicotine  (NICODERM CQ  - dosed in mg/24 hours) patch 14 mg  14 mg Transdermal Daily Dew, Jason S, MD   14 mg at 10/08/24 9052   nicotine  polacrilex (NICORETTE ) gum 2 mg  2 mg Oral Q2H PRN Dew, Jason S,  MD   2 mg at 10/08/24 0945   ondansetron  (ZOFRAN ) tablet 4 mg  4 mg Oral Q6H PRN Dew, Jason S, MD   4 mg at 10/07/24 9261   Or   ondansetron  (ZOFRAN ) injection 4 mg  4 mg Intravenous Q6H PRN Dew, Jason S, MD   4 mg at 10/08/24 9662   oxyCODONE  (Oxy IR/ROXICODONE ) immediate release tablet 7.5 mg  7.5 mg Oral Q6H PRN Dew, Jason S, MD   7.5 mg at 10/08/24 0945   pantoprazole  (PROTONIX ) EC tablet 40 mg  40 mg Oral Daily Dew, Jason S, MD   40 mg at 10/08/24 0944   polyethylene glycol (MIRALAX  / GLYCOLAX ) packet 17 g  17 g Oral BID Dew, Jason S, MD   17 g at 10/06/24 2221   senna-docusate (Senokot-S) tablet 1 tablet  1 tablet Oral BID Dew, Jason S, MD   1 tablet at 10/08/24 0945   simethicone  (MYLICON) chewable tablet 80 mg  80 mg Oral QID Dew, Jason S, MD   80 mg at 10/08/24 0944   sodium phosphate  (FLEET) enema 1 enema  1 enema Rectal Daily PRN Dew, Jason S, MD   1 enema at 10/01/24 1724   vancomycin  (VANCOREADY) IVPB 1250 mg/250 mL  1,250 mg Intravenous Q M,W,F-1800 Zeigler, Dustin G, RPH 166.7 mL/hr at 10/07/24 2111 1,250 mg at 10/07/24 2111     Discharge Medications: Please see discharge summary for a list of discharge medications.  Relevant Imaging Results:  Relevant Lab Results:   Additional Information SS#: [id number].  Trinidi Toppins S Tamikka Pilger, RN     "

## 2024-10-08 NOTE — Plan of Care (Signed)

## 2024-10-08 NOTE — Progress Notes (Signed)
 " [facility]  ROUNDING NOTE   Subjective:   Eugene [name] is a 64 year old male with past medical conditions including diabetes, hepatitis C, bilateral BKA, anxiety and depression, and end-stage renal disease on hemodialysis.  Patient presents to the emergency department from his facility complaining of shortness of breath.  Patient is currently admitted for SIRS (systemic inflammatory response syndrome) (HCC) [R65.10]  Patient is known to our practice and receives outpatient dialysis treatments at [facility] on a MWF schedule, supervised by Dr. Dennise.    Update:  Patient seen sitting up in bed 8 L [place] No signs of distress   Objective:  Vital signs in last 24 hours:  Temp:  [97.6 F (36.4 C)-98.6 F (37 C)] 98.3 F (36.8 C) (01/15 0817) Pulse Rate:  [65-87] 75 (01/15 0817) Resp:  [14-21] 14 (01/15 0817) BP: (86-140)/(45-65) 136/61 (01/15 0817) SpO2:  [88 %-98 %] 98 % (01/15 0817) Weight:  [123.7 kg-126.1 kg] 123.7 kg (01/14 1745)  Weight change:  Filed Weights   10/05/24 1306 10/07/24 1316 10/07/24 1745  Weight: 124.6 kg 126.1 kg 123.7 kg    Intake/Output: I/O last 3 completed shifts: In: 601.8 [IV Piggyback:601.8] Out: 2400 [Other:2400]   Intake/Output this shift:  No intake/output data recorded.  Physical Exam: General: NAD  Head: Normocephalic  Eyes: Anicteric  Lungs:  Wheeze, 8 L HFNC  Heart: Regular rate and rhythm  Abdomen:  Soft, nontender  Extremities: Trace edema.  Bilateral BKA  Neurologic: Awake, alert, conversant  Skin: Warm,dry, no rash  Access: Lt internal jugular permcath    Basic Metabolic Panel: Recent Labs  Lab 10/02/24 0627 10/03/24 0628 10/04/24 0802 10/05/24 1410 10/06/24 0524 10/07/24 1430 10/08/24 0527  NA 140   < > 137 137 136 132* 132*  K 4.0   < > 4.0 4.2 4.4 4.7 4.3  CL 104   < > 105 103 102 99 96*  CO2 22   < > 22 24 25  21* 25  GLUCOSE 203*   < > 277* 255* 229* 189* 208*  BUN 66*   < > 75* 57* 51* 71*  43*  CREATININE 4.73*   < > 5.39* 3.89* 4.33* 6.45* 4.78*  CALCIUM  7.6*   < > 7.6* 8.0* 8.1* 7.7* 7.9*  MG  --   --  1.9  --   --   --  1.9  PHOS 5.0*  --   --   --  4.2 5.6* 4.2   < > = values in this interval not displayed.    Liver Function Tests: Recent Labs  Lab 10/06/24 0524 10/07/24 1430  ALBUMIN  2.6* 2.5*   No results for input(s): LIPASE, AMYLASE in the last 168 hours.  No results for input(s): AMMONIA in the last 168 hours.  CBC: Recent Labs  Lab 10/01/24 1330 10/03/24 0628 10/04/24 0802 10/06/24 0524 10/07/24 0449 10/08/24 0527  WBC 10.8* 8.5 7.9 6.9 7.0 6.1  NEUTROABS 9.3*  --   --  4.6 4.1  --   HGB 10.4* 10.0* 9.8* 9.8* 8.7* 8.5*  HCT 32.3* 31.4* 30.9* 31.2* 27.6* 27.2*  MCV 93.1 94.0 94.5 94.0 94.2 93.8  PLT 93* 85* 102* 129* 143* 149*    Cardiac Enzymes: No results for input(s): CKTOTAL, CKMB, CKMBINDEX, TROPONINI in the last 168 hours.  BNP: Invalid input(s): POCBNP  CBG: Recent Labs  Lab 10/07/24 0931 10/07/24 1141 10/07/24 1853 10/07/24 2110 10/08/24 0819  GLUCAP 159* 165* 242* 233* 161*    Microbiology: Results for  orders placed or performed during the hospital encounter of 09/29/24  Resp panel by RT-PCR (RSV, Flu A&B, Covid) Anterior Nasal Swab     Status: None   Collection Time: 09/29/24  2:16 PM   Specimen: Anterior Nasal Swab  Result Value Ref Range Status   SARS Coronavirus 2 by RT PCR NEGATIVE NEGATIVE Final    Comment: (NOTE) SARS-CoV-2 target nucleic acids are NOT DETECTED.  The SARS-CoV-2 RNA is generally detectable in upper respiratory specimens during the acute phase of infection. The lowest concentration of SARS-CoV-2 viral copies this assay can detect is 138 copies/mL. A negative result does not preclude SARS-Cov-2 infection and should not be used as the sole basis for treatment or other patient management decisions. A negative result may occur with  improper specimen collection/handling, submission  of specimen other than nasopharyngeal swab, presence of viral mutation(s) within the areas targeted by this assay, and inadequate number of viral copies(<138 copies/mL). A negative result must be combined with clinical observations, patient history, and epidemiological information. The expected result is Negative.  Fact Sheet for Patients:  bloggercourse.com  Fact Sheet for Healthcare Providers:  seriousbroker.it  This test is no t yet approved or cleared by the [place]  FDA and  has been authorized for detection and/or diagnosis of SARS-CoV-2 by FDA under an Emergency Use Authorization (EUA). This EUA will remain  in effect (meaning this test can be used) for the duration of the COVID-19 declaration under Section 564(b)(1) of the Act, 21 U.S.C.section 360bbb-3(b)(1), unless the authorization is terminated  or revoked sooner.       Influenza A by PCR NEGATIVE NEGATIVE Final   Influenza B by PCR NEGATIVE NEGATIVE Final    Comment: (NOTE) The Xpert Xpress SARS-CoV-2/FLU/RSV plus assay is intended as an aid in the diagnosis of influenza from Nasopharyngeal swab specimens and should not be used as a sole basis for treatment. Nasal washings and aspirates are unacceptable for Xpert Xpress SARS-CoV-2/FLU/RSV testing.  Fact Sheet for Patients: bloggercourse.com  Fact Sheet for Healthcare Providers: seriousbroker.it  This test is not yet approved or cleared by the [place]  FDA and has been authorized for detection and/or diagnosis of SARS-CoV-2 by FDA under an Emergency Use Authorization (EUA). This EUA will remain in effect (meaning this test can be used) for the duration of the COVID-19 declaration under Section 564(b)(1) of the Act, 21 U.S.C. section 360bbb-3(b)(1), unless the authorization is terminated or revoked.     Resp Syncytial Virus by PCR NEGATIVE NEGATIVE  Final    Comment: (NOTE) Fact Sheet for Patients: bloggercourse.com  Fact Sheet for Healthcare Providers: seriousbroker.it  This test is not yet approved or cleared by the [place]  FDA and has been authorized for detection and/or diagnosis of SARS-CoV-2 by FDA under an Emergency Use Authorization (EUA). This EUA will remain in effect (meaning this test can be used) for the duration of the COVID-19 declaration under Section 564(b)(1) of the Act, 21 U.S.C. section 360bbb-3(b)(1), unless the authorization is terminated or revoked.  Performed at [facility], [address], [place], [place] [address]   Urine Culture     Status: Abnormal   Collection Time: 09/29/24  2:17 PM   Specimen: Urine, Random  Result Value Ref Range Status   Specimen Description   Final    URINE, RANDOM Performed at [facility], [address], [place], [place] [address]    Special Requests   Final    NONE Reflexed from [id number] Performed at [facility], [address] [place], [place] [address]    Culture (A)  Final    >=100,000 COLONIES/mL PROTEUS MIRABILIS >=100,000 COLONIES/mL METHICILLIN RESISTANT STAPHYLOCOCCUS AUREUS    Report Status 10/02/2024 FINAL  Final   Organism ID, Bacteria PROTEUS MIRABILIS (A)  Final   Organism ID, Bacteria METHICILLIN RESISTANT STAPHYLOCOCCUS AUREUS (A)  Final      Susceptibility   Methicillin resistant staphylococcus aureus - MIC*    CIPROFLOXACIN  >=8 RESISTANT Resistant     GENTAMICIN <=0.5 SENSITIVE Sensitive     NITROFURANTOIN <=16 SENSITIVE Sensitive     OXACILLIN >=4 RESISTANT Resistant     TETRACYCLINE <=1 SENSITIVE Sensitive     VANCOMYCIN  1 SENSITIVE Sensitive     TRIMETH /SULFA  <=10 SENSITIVE Sensitive     RIFAMPIN  <=0.5 SENSITIVE Sensitive     Inducible Clindamycin NEGATIVE Sensitive     LINEZOLID  2 SENSITIVE Sensitive     * >=100,000 COLONIES/mL METHICILLIN RESISTANT  STAPHYLOCOCCUS AUREUS   Proteus mirabilis - MIC*    AMPICILLIN <=2 SENSITIVE Sensitive     CEFAZOLIN  (URINE) Value in next row Sensitive      4 SENSITIVEThis is a modified FDA-approved test that has been validated and its performance characteristics determined by the reporting laboratory.  This laboratory is certified under the Clinical Laboratory Improvement Amendments CLIA as qualified to perform high complexity clinical laboratory testing.    CEFEPIME  Value in next row Sensitive      4 SENSITIVEThis is a modified FDA-approved test that has been validated and its performance characteristics determined by the reporting laboratory.  This laboratory is certified under the Clinical Laboratory Improvement Amendments CLIA as qualified to perform high complexity clinical laboratory testing.    ERTAPENEM Value in next row Sensitive      4 SENSITIVEThis is a modified FDA-approved test that has been validated and its performance characteristics determined by the reporting laboratory.  This laboratory is certified under the Clinical Laboratory Improvement Amendments CLIA as qualified to perform high complexity clinical laboratory testing.    CEFTRIAXONE  Value in next row Sensitive      4 SENSITIVEThis is a modified FDA-approved test that has been validated and its performance characteristics determined by the reporting laboratory.  This laboratory is certified under the Clinical Laboratory Improvement Amendments CLIA as qualified to perform high complexity clinical laboratory testing.    CIPROFLOXACIN  Value in next row Resistant      4 SENSITIVEThis is a modified FDA-approved test that has been validated and its performance characteristics determined by the reporting laboratory.  This laboratory is certified under the Clinical Laboratory Improvement Amendments CLIA as qualified to perform high complexity clinical laboratory testing.    GENTAMICIN Value in next row Sensitive      4 SENSITIVEThis is a modified  FDA-approved test that has been validated and its performance characteristics determined by the reporting laboratory.  This laboratory is certified under the Clinical Laboratory Improvement Amendments CLIA as qualified to perform high complexity clinical laboratory testing.    NITROFURANTOIN Value in next row Resistant      4 SENSITIVEThis is a modified FDA-approved test that has been validated and its performance characteristics determined by the reporting laboratory.  This laboratory is certified under the Clinical Laboratory Improvement Amendments CLIA as qualified to perform high complexity clinical laboratory testing.    TRIMETH /SULFA  Value in next row Sensitive      4 SENSITIVEThis is a modified FDA-approved test that has been validated and its performance characteristics determined by the reporting laboratory.  This laboratory is certified under the Clinical Laboratory Improvement Amendments CLIA as qualified to perform high complexity clinical laboratory testing.    AMPICILLIN/SULBACTAM Value in next row Sensitive      4 SENSITIVEThis is a modified FDA-approved test that has been validated and its performance characteristics determined by the reporting laboratory.  This laboratory is certified under the Clinical Laboratory Improvement Amendments CLIA as qualified to perform high complexity clinical laboratory testing.    PIP/TAZO Value in next row Sensitive      <=4 SENSITIVEThis is a modified FDA-approved test that has been validated and its performance characteristics determined by the reporting laboratory.  This laboratory is certified under the Clinical Laboratory Improvement Amendments CLIA as qualified to perform high complexity clinical laboratory testing.    MEROPENEM Value in next row Sensitive      <=4 SENSITIVEThis is a modified FDA-approved test that has been validated and its performance characteristics determined by the reporting laboratory.  This laboratory is certified under the  Clinical Laboratory Improvement Amendments CLIA as qualified to perform high complexity clinical laboratory testing.    * >=100,000 COLONIES/mL PROTEUS MIRABILIS  Blood Culture (routine x 2)     Status: Abnormal   Collection Time: 09/29/24  2:37 PM   Specimen: BLOOD  Result Value Ref Range Status   Specimen Description   Final    BLOOD BLOOD RIGHT FOREARM Performed at [facility], [address], [place], [place] [address]    Special Requests   Final    BOTTLES DRAWN AEROBIC AND ANAEROBIC Blood Culture results may not be optimal due to an inadequate volume of blood received in culture bottles Performed at [facility], [address] [place], [place] [address]    Culture  Setup Time   Final    GRAM POSITIVE COCCI IN BOTH AEROBIC AND ANAEROBIC BOTTLES CRITICAL VALUE NOTED.  VALUE IS CONSISTENT WITH PREVIOUSLY REPORTED AND CALLED VALUE.    Culture (A)  Final    STAPHYLOCOCCUS AUREUS SUSCEPTIBILITIES PERFORMED ON PREVIOUS CULTURE WITHIN THE LAST 5 DAYS. Performed at [facility], [address], [place], [place] [address]    Report Status 10/02/2024 FINAL  Final  Blood Culture (routine x 2)     Status: Abnormal (Preliminary result)   Collection Time: 09/29/24  2:37 PM   Specimen: BLOOD  Result Value Ref Range Status   Specimen Description   Final    BLOOD LEFT ANTECUBITAL Performed at [facility], [address] [place], [place] [address]    Special Requests   Final    BOTTLES DRAWN AEROBIC AND ANAEROBIC Blood Culture results may not be optimal due to an inadequate volume of blood received in culture bottles Performed at [facility], [address], [place], [place] [address]    Culture  Setup Time   Final    GRAM POSITIVE COCCI IN BOTH AEROBIC AND ANAEROBIC BOTTLES CRITICAL RESULT CALLED TO, READ BACK BY AND VERIFIED WITH: PHARMD NATHAN B ON [id number] @0457  BY HNM    Culture (A)  Final    METHICILLIN RESISTANT STAPHYLOCOCCUS  AUREUS Sent to [facility] for further susceptibility testing. Performed at [facility], [address] [place], [place] [address]    Report Status PENDING  Incomplete   Organism ID, Bacteria METHICILLIN RESISTANT STAPHYLOCOCCUS AUREUS  Final      Susceptibility   Methicillin resistant staphylococcus aureus - MIC*    CIPROFLOXACIN  >=8 RESISTANT Resistant     ERYTHROMYCIN  >=8 RESISTANT  Resistant     GENTAMICIN <=0.5 SENSITIVE Sensitive     OXACILLIN >=4 RESISTANT Resistant     TETRACYCLINE <=1 SENSITIVE Sensitive     VANCOMYCIN  1 SENSITIVE Sensitive     TRIMETH /SULFA  <=10 SENSITIVE Sensitive     CLINDAMYCIN >=8 RESISTANT Resistant     RIFAMPIN  <=0.5 SENSITIVE Sensitive     Inducible Clindamycin NEGATIVE Sensitive     LINEZOLID  2 SENSITIVE Sensitive     * METHICILLIN RESISTANT STAPHYLOCOCCUS AUREUS  Blood Culture ID Panel (Reflexed)     Status: Abnormal   Collection Time: 09/29/24  2:37 PM  Result Value Ref Range Status   Enterococcus faecalis NOT DETECTED NOT DETECTED Final   Enterococcus Faecium NOT DETECTED NOT DETECTED Final   Listeria monocytogenes NOT DETECTED NOT DETECTED Final   Staphylococcus species DETECTED (A) NOT DETECTED Final    Comment: CRITICAL RESULT CALLED TO, READ BACK BY AND VERIFIED WITHBETHA RANKIN DILLS PHARMD [id number] 09/30/24 HNM    Staphylococcus aureus (BCID) DETECTED (A) NOT DETECTED Final    Comment: Methicillin (oxacillin)-resistant Staphylococcus aureus (MRSA). MRSA is predictably resistant to beta-lactam antibiotics (except ceftaroline). Preferred therapy is vancomycin  unless clinically contraindicated. Patient requires contact precautions if  hospitalized. CRITICAL RESULT CALLED TO, READ BACK BY AND VERIFIED WITH: RANKIN DILLS PHARMD [id number] 09/30/24 HNM    Staphylococcus epidermidis NOT DETECTED NOT DETECTED Final   Staphylococcus lugdunensis NOT DETECTED NOT DETECTED Final   Streptococcus species NOT DETECTED NOT DETECTED Final   Streptococcus agalactiae  NOT DETECTED NOT DETECTED Final   Streptococcus pneumoniae NOT DETECTED NOT DETECTED Final   Streptococcus pyogenes NOT DETECTED NOT DETECTED Final   A.calcoaceticus-baumannii NOT DETECTED NOT DETECTED Final   Bacteroides fragilis NOT DETECTED NOT DETECTED Final   Enterobacterales NOT DETECTED NOT DETECTED Final   Enterobacter cloacae complex NOT DETECTED NOT DETECTED Final   Escherichia coli NOT DETECTED NOT DETECTED Final   Klebsiella aerogenes NOT DETECTED NOT DETECTED Final   Klebsiella oxytoca NOT DETECTED NOT DETECTED Final   Klebsiella pneumoniae NOT DETECTED NOT DETECTED Final   Proteus species NOT DETECTED NOT DETECTED Final   Salmonella species NOT DETECTED NOT DETECTED Final   Serratia marcescens NOT DETECTED NOT DETECTED Final   Haemophilus influenzae NOT DETECTED NOT DETECTED Final   Neisseria meningitidis NOT DETECTED NOT DETECTED Final   Pseudomonas aeruginosa NOT DETECTED NOT DETECTED Final   Stenotrophomonas maltophilia NOT DETECTED NOT DETECTED Final   Candida albicans NOT DETECTED NOT DETECTED Final   Candida auris NOT DETECTED NOT DETECTED Final   Candida glabrata NOT DETECTED NOT DETECTED Final   Candida krusei NOT DETECTED NOT DETECTED Final   Candida parapsilosis NOT DETECTED NOT DETECTED Final   Candida tropicalis NOT DETECTED NOT DETECTED Final   Cryptococcus neoformans/gattii NOT DETECTED NOT DETECTED Final   Meth resistant mecA/C and MREJ DETECTED (A) NOT DETECTED Final    Comment: CRITICAL RESULT CALLED TO, READ BACK BY AND VERIFIED WITHBETHA RANKIN DILLS PHARMD [id number] 09/30/24 HNM Performed at [facility], [address] [place], [place] [address]   MIC (1 Drug)-     Status: Abnormal (Preliminary result)   Collection Time: 09/29/24  2:37 PM  Result Value Ref Range Status   Min Inhibitory Conc (1 Drug) Preliminary report (A)  Final    Comment: (NOTE) Performed At: [facility] [employer] [address] [place], [place] [id number] Jennette Shorter MD  Ey:[phone]    Source BLOOD  Final    Comment: Performed at [facility], [address], [place], [place] [address]  MIC Result     Status: Abnormal   Collection Time: 09/29/24  2:37 PM  Result Value Ref Range Status   Result 1 (MIC) Comment (A)  Final    Comment: (NOTE) Methicillin - resistant Staphylococcus aureus Identification performed by account, not confirmed by this laboratory. Performed At: [facility] [address] [place], [place] [id number] Jennette Shorter MD Ey:[phone]   MRSA Next Gen by PCR, Nasal     Status: Abnormal   Collection Time: 09/30/24  5:00 PM   Specimen: Nasal Mucosa; Nasal Swab  Result Value Ref Range Status   MRSA by PCR Next Gen DETECTED (A) NOT DETECTED Final    Comment: RESULT CALLED TO, READ BACK BY AND VERIFIED WITH: TAMMY ORLANDO 2032 09/30/24 MU (NOTE) The GeneXpert MRSA Assay (FDA approved for NASAL specimens only), is one component of a comprehensive MRSA colonization surveillance program. It is not intended to diagnose MRSA infection nor to guide or monitor treatment for MRSA infections. Test performance is not FDA approved in patients less than 102 years old. Performed at [facility], [address], [place], [place] [address]   Cath Tip Culture     Status: Abnormal   Collection Time: 10/01/24 11:03 AM   Specimen: Catheter Tip; Other  Result Value Ref Range Status   Specimen Description   Final    CATH TIP Performed at [facility], [address] [place], [place] [address]    Special Requests   Final    NONE Performed at [facility], [address] [place], [place] [address]    Culture METHICILLIN RESISTANT STAPHYLOCOCCUS AUREUS (A)  Final   Report Status 10/03/2024 FINAL  Final   Organism ID, Bacteria METHICILLIN RESISTANT STAPHYLOCOCCUS AUREUS  Final      Susceptibility   Methicillin resistant staphylococcus aureus - MIC*    CIPROFLOXACIN  >=8 RESISTANT Resistant      ERYTHROMYCIN  >=8 RESISTANT Resistant     GENTAMICIN <=0.5 SENSITIVE Sensitive     OXACILLIN >=4 RESISTANT Resistant     TETRACYCLINE <=1 SENSITIVE Sensitive     VANCOMYCIN  <=0.5 SENSITIVE Sensitive     TRIMETH /SULFA  <=10 SENSITIVE Sensitive     CLINDAMYCIN >=8 RESISTANT Resistant     RIFAMPIN  <=0.5 SENSITIVE Sensitive     Inducible Clindamycin NEGATIVE Sensitive     LINEZOLID  2 SENSITIVE Sensitive     * METHICILLIN RESISTANT STAPHYLOCOCCUS AUREUS  Culture, blood (Routine X 2) w Reflex to ID Panel     Status: None   Collection Time: 10/02/24  6:36 AM   Specimen: BLOOD  Result Value Ref Range Status   Specimen Description BLOOD BLOOD LEFT ARM  Final   Special Requests   Final    BOTTLES DRAWN AEROBIC AND ANAEROBIC Blood Culture adequate volume   Culture   Final    NO GROWTH 5 DAYS Performed at [facility], [address], [place], [place] [address]    Report Status 10/07/2024 FINAL  Final  Culture, blood (Routine X 2) w Reflex to ID Panel     Status: None   Collection Time: 10/02/24  6:36 AM   Specimen: BLOOD  Result Value Ref Range Status   Specimen Description BLOOD BLOOD LEFT ARM  Final   Special Requests   Final    BOTTLES DRAWN AEROBIC AND ANAEROBIC Blood Culture adequate volume   Culture   Final    NO GROWTH 5 DAYS Performed at [facility], [address] [place], [place] [address]    Report Status 10/07/2024 FINAL  Final    Coagulation Studies: No results for input(s): LABPROT, INR in the last 72 hours.   Urinalysis: No results for input(s): COLORURINE, LABSPEC, PHURINE, GLUCOSEU, HGBUR, BILIRUBINUR, KETONESUR, PROTEINUR, UROBILINOGEN, NITRITE, LEUKOCYTESUR in the last 72 hours.  Invalid input(s): APPERANCEUR     Imaging: DG Abd 1 View Result Date: 10/07/2024 EXAM: 1 VIEW XRAY OF THE ABDOMEN 10/07/2024 07:17:00 PM COMPARISON: XR Abdomen 09/29/2024, CT Abdomen/Pelvis 09/30/2024. CLINICAL HISTORY: SBO (small bowel  obstruction) (HCC). FINDINGS: BOWEL: Diffusely gas-filled, although not overtly distended, small bowel and colon throughout abdomen and pelvis. nonobstructive bowel gas pattern. SOFT TISSUES: No abnormal calcifications. BONES: Postoperative changes in lower thoracic spine. No acute fracture. LUNGS: Bilateral lung base opacity. IMPRESSION: 1. Nonobstructive bowel gas pattern. 2. Bilateral lung base opacity. Electronically signed by: Morgane Naveau MD 10/07/2024 07:24 PM EST RP Workstation: [id number]   CT Angio Chest Pulmonary Embolism (PE) W or WO Contrast Result Date: 10/06/2024 CLINICAL DATA:  Possible sepsis.  Evaluate for pulmonary embolism. EXAM: CT ANGIOGRAPHY CHEST WITH CONTRAST TECHNIQUE: Multidetector CT imaging of the chest was performed using the standard protocol during bolus administration of intravenous contrast. Multiplanar CT image reconstructions and MIPs were obtained to evaluate the vascular anatomy. RADIATION DOSE REDUCTION: This exam was performed according to the departmental dose-optimization program which includes automated exposure control, adjustment of the mA and/or kV according to patient size and/or use of iterative reconstruction technique. CONTRAST:  OMNIPAQUE  IOHEXOL  350 MG/ML SOLN COMPARISON:  CT 06/23/2024, 05/11/2024 FINDINGS: Cardiovascular: Mild stable cardiomegaly. Mild calcified plaque over the left main and 3 vessel coronary arteries. Thoracic aorta is normal in caliber. Minimal calcified plaque over the thoracic aorta. Pulmonary arterial system is adequately opacified without definite pulmonary emboli. Remaining vascular structures are unremarkable. Mediastinum/Nodes: 1 cm right paratracheal lymph node likely reactive. No other concerning mediastinal or hilar adenopathy. Mediastinal structures are otherwise unremarkable. Lungs/Pleura: Small to moderate bilateral pleural effusions left greater than right with associated compressive atelectasis within the lung bases as  these findings are slightly worse to unchanged. There is patchy hazy airspace attenuation over the mid to upper lungs with areas of mild nodularity new since the prior exam and may represent an acute infectious or inflammatory process. Obstruction of a left posterior basilar bronchus. Mild debris along the right lateral wall of the trachea. Upper Abdomen: No acute findings of the visualized upper abdomen. Musculoskeletal: Stable bilateral changes suggesting gynecomastia. Spinal stabilization hardware over the thoracic spine bridging corpectomy of 2 vertebral body levels over the lower thoracic spine. Review of the MIP images confirms the above findings. IMPRESSION: 1. No evidence of pulmonary embolism. 2. Small to moderate bilateral pleural effusions left greater than right with associated compressive atelectasis within the lung bases as these findings are slightly worse to unchanged. 3. Patchy hazy airspace attenuation over the mid to upper lungs with areas of mild nodularity new since the prior exam and may represent an acute infectious or inflammatory process. 4. Aortic atherosclerosis. Atherosclerotic coronary artery disease. Aortic Atherosclerosis (ICD10-I70.0). Electronically Signed   By: Toribio Agreste M.D.   On: 10/06/2024 15:13     Medications:    anticoagulant sodium citrate      vancomycin  1,250 mg (10/07/24 2111)    bisacodyl   10 mg Oral QHS   calcitRIOL   0.5 mcg Oral Daily   carvedilol   6.25 mg Oral BID WC   Chlorhexidine  Gluconate Cloth  6 each Topical Q0600   cyanocobalamin   1,000 mcg Oral Daily   diltiazem   240 mg Oral QPM   doxepin   10 mg Oral QHS   famotidine   10 mg Oral Daily   folic acid   1 mg Oral Daily   gabapentin   300 mg Oral QHS   heparin   5,000 Units Subcutaneous Q8H   insulin  aspart  0-5 Units Subcutaneous QHS   insulin  aspart  0-6 Units Subcutaneous TID WC   insulin  aspart  9 Units Subcutaneous TID WC   insulin  glargine  22 Units Subcutaneous BID   levothyroxine   175  mcg Oral QHS   linaclotide   145 mcg Oral QAC breakfast   loratadine   10 mg Oral Daily   mupirocin  ointment  1 Application Nasal BID   nicotine   14 mg Transdermal Daily   pantoprazole   40 mg Oral Daily   polyethylene glycol  17 g Oral BID   senna-docusate  1 tablet Oral BID   simethicone   80 mg Oral QID   acetaminophen  **OR** acetaminophen , albuterol , anticoagulant sodium citrate , bisacodyl , guaiFENesin , liver oil-zinc  oxide, nicotine  polacrilex, ondansetron  **OR** ondansetron  (ZOFRAN ) IV, oxyCODONE , sodium phosphate   Assessment/ Plan:  Eugene [name] is a 64 y.o.  male with past medical conditions including diabetes, hepatitis C, bilateral BKA, anxiety and depression, and end-stage renal disease on hemodialysis.  Patient presents to the emergency department from his facility complaining of shortness of breath.  Patient is currently admitted for SIRS (systemic inflammatory response syndrome) (HCC) [R65.10]  [facility]/MWF/120.5 kg  End-stage renal disease on hemodialysis.        Due to infection, vascular removed right IJ PermCath on 1/8.  Received line free holiday. Vascular has placed lt internal jugular permcath.   Dialysis received yesterday, UF 2.4L with the assistance of IV albumnin. Next treatment scheduled for Friday  Anemia of chronic kidney disease Lab Results  Component Value Date   HGB 8.5 (L) 10/08/2024   Hemoglobin 8.5, will order low dose Retacrit  with next dialysis.   3.  Systemic inflammatory response syndrome, suspected due to fever, leukocytosis, and tachypnea on ED arrival.  Suspected source of suprapubic catheter. Abdominal x-ray shows moderate diffuse air distention of the bowel suspicious for ileus.  Blood culture positive for MRSA. Vascular removed PermCath on 1/8 and replaced on 1/12.  Continues to receive vancomycin , ordered per primary team.  ID consulted, awaiting updated recommendations for antibiotics at discharge. Recommending TEE  4.  Secondary Hyperparathyroidism: with outpatient labs: None available  Lab Results  Component Value Date   PTH 177 (H) 04/23/2024   CALCIUM  7.9 (L) 10/08/2024   PHOS 4.2 10/08/2024   Calcium  and phosphorus at goal. Remains on calcitriol  during this admission.    LOS: 9 Nastasia Kage 1/15/202610:47 AM   "

## 2024-10-08 NOTE — Treatment Plan (Signed)
 Diagnosis: MRSA bacteremia with HD cath infection Treating like endocarditis as TEE could not be done Baseline Creatinine esrd   Allergies[1]NKDA  OPAT Orders Discharge antibiotics: Vancomycin  1250mg    after dialysis M-W-F Per pharmacy protocol  Aim for Vancomycin  trough -20 (unless otherwise indicated) Duration: 6 weeks End Date: 11/11/24     Labs weekly while on IV antibiotics: _X_ CBC with differential  _X_ CMP _ _X_ Vancomycin  trough  _  Fax weekly lab results  promptly to [phone]  Clinic Follow Up Appt:11/03/24 at 9.30 Am -My chart Video visit   Call [phone] with questions or critical value      [1] No Known Allergies

## 2024-10-08 NOTE — Progress Notes (Signed)
"   [facility] [place] Liaison Note:  This patient is currently enrolled in [facility] outpatient-based palliative care.   Please call for any outpatient based palliative care related questions or concerns.  Thank you, Daphne Shed, LPN [facility] Liaison [phone] "

## 2024-10-08 NOTE — Progress Notes (Signed)
 [facility] Progress Note  Patient: Eugene [name]    FMW:[id number]  DOA: 09/29/2024     Date of Service: the patient was seen and examined on 10/08/2024  Chief Complaint  Patient presents with   Shortness of Breath   Brief hospital course:  Eugene [name] is a 64 y.o. male with medical history significant of anxiety, depression, type 2 diabetes mellitus, history of hepatitis C, end-stage renal disease on dialysis, bilateral BKA.  Patient known to most historian.  States he is short of breath.  He is sick to his stomach.  He vomited.  No diarrhea.  He is short of breath.  He is having a fever.  In the ER he was found to have an elevated white blood cell count and started on sepsis protocol.  Respiratory panel pending, chest x-ray shows mild interstitial edema and left greater than right pleural effusion.  Urine looks positive and patient has a suprapubic catheter.  Hospitalist services contacted for further evaluation.      Assessment and Plan:  # Sepsis secondary to MRSA bacteremia Present on admission.  Sepsis criteria fever leukocytosis and tachycardia.  Now resolved.  MRSA positive in 4 out of 4 bottles. HD catheter removed 1/8 Afebrile overnight Repeat blood cultures no growth to date Continue vancomycin  -- Continue to follow blood cultures --CT of the chest given patient intermittently requiring 10 L of oxygen up from baseline of 6 L 1/13 CT Chest negative for PE, bilateral pleural effusion, mild to moderate.  Atelectasis -- Will likely need 6 weeks of antibiotics given likely cannot undergo TEE 1/14 cardiology consulted to eval for TEE.  Cardiology recommended no TEE, risk outweigh the benefit.  Advised to continue antibiotics for longer duration.  ID is aware.   # Ileus Small and large bowel ileus 1/14 KUB: Diffusely gas-filled, although not overtly distended, small bowel and colon throughout abdomen and pelvis. nonobstructive bowel gas pattern. 1/7 CT a/p: notable for  proctocolitis but no findings to suggest etiology of his nausea.   --Continue antiemetics, aggressive bowel regimen --Continue zofran  prn 1/15 as per KUB patient may have ileus, discussed with patient regarding NG tube insertion and suction, he refused.  Advised to start liquid diet and avoid solids, remains at high risk for aspiration but he did not wanted liquid diet, wanted to continue regular diet.  He will consider NG tube suctioning if he gets sick or starts vomiting. Continue aspiration precautions.  Normocytic anemia Thrombocytopenia improving Likely in settong of anemia of chronic disease and infection. H/o B12 deficiency Continue B12 supplementation, folate borderline, continue supplementation      ESRD (end stage renal disease) (HCC) Hemodialysis per nephrology   Paroxysmal SVT (supraventricular tachycardia) On Cardizem  and Coreg    Heart failure with preserved ejection fraction (HCC) Dialysis to manage fluid   Acute on chronic respiratory failure with hypoxia (HCC) resolved Patient oxygen level intermittently increased for comfort, he maintained appropriate oxygen saturations on 6 L.  An ABG was obtained after placing patient on 6 L of oxygen for 3 hours and his O2 saturation was 96%.  Patient currently refusing CPAP machine. Incentive spirometry Maintain O2 saturations greater than 92%   Asymptomatic bacteruria  Ucx with proteus mirabilis, and MRSA. MRSA due to MRSA bacteremia likely in setting of line infection.  Patient is on IV vancomycin . Follow ID for duration of treatment   HTN Blood pressure remains slightly elevated. Discontinued midodrine  Continue Coreg  and Cardizem  Monitor BP and titrate medications accordingly  Chronic bilateral pleural  effusions Small.  Left greater than right.  Continue HD.    Chronic pain On oxycodone    Uncontrolled type 2 diabetes mellitus with hyperglycemia, with long-term current use of insulin  (HCC) A1c 7.3 though ESRD.   Remains poorly controlled will increase his Lantus  to 22 units twice daily and his mealtime insulin  to 9 units 3 times daily with meals.     Chronic constipation Patient on numerous medications for constipation.  Had a bowel movement this a.m. --Continue with aggressive bowel regimen      Hypothyroidism: On levothyroxine    Vitamin D  deficiency: Started Calcitrol 0.5 mcg p.o. daily. follow with PCP to repeat vitamin D  level in between 3 to 6 months.  Obesity class II Body mass index is 39.13 kg/m.  Interventions: Calorie restricted diet advised to lose body weight.    Wound 09/29/24 1832 Pressure Injury Buttocks Right;Left Stage 2 -  Partial thickness loss of dermis presenting as a shallow open injury with a red, pink wound bed without slough. (Active)     Diet: Carb modified diet DVT Prophylaxis: Subcutaneous Heparin     Advance goals of care discussion: Full code  Family Communication: family was not present at bedside, at the time of interview.  The pt provided permission to discuss medical plan with the family. Opportunity was given to ask question and all questions were answered satisfactorily.   Disposition:  Pt is from SNF LTC, admitted with MRSA bacteremia, still workup pending, patient need TEE, which precludes a safe discharge. Discharge back to LTC, when stable and cleared by ID.  May need few days to improve, TEE is pending  Subjective: No significant events overnight.  Patient is still complaining of abdominal fullness and nausea but no vomiting.  Denies any chest pain or palpitation, no shortness of breath. Patient does not want NG tube with intermittent suction and even does not want to be on a liquid diet, he would like to continue same diet.  Patient will consider NG tube suction if he feels sick and starts vomiting. Patient verbalized understanding the risk of not getting the NG tube with suction.   Physical Exam: General: NAD, lying comfortably Appear in no  distress, affect appropriate Eyes: PERRLA ENT: Oral Mucosa Clear, moist  Neck: no JVD,  Cardiovascular: S1 and S2 Present, no Murmur,  Respiratory: good air entry bilaterally, decreased bibasilar breath sounds and bibasilar crackles, no significant wheezes Abdomen: BS present, Soft, obese, distended, mild generalized tenderness Skin: no rashes Extremities: no Pedal edema, no calf tenderness Neurologic: without any new focal findings Gait not checked due to patient safety concerns  Vitals:   10/07/24 2100 10/08/24 0432 10/08/24 0744 10/08/24 0817  BP:  121/64  136/61  Pulse:  76  75  Resp:  18  14  Temp:  98.6 F (37 C)  98.3 F (36.8 C)  TempSrc:      SpO2: 93% 95% 92% 98%  Weight:      Height:        Intake/Output Summary (Last 24 hours) at 10/08/2024 1530 Last data filed at 10/08/2024 0600 Gross per 24 hour  Intake 601.77 ml  Output 2400 ml  Net -1798.23 ml   Filed Weights   10/05/24 1306 10/07/24 1316 10/07/24 1745  Weight: 124.6 kg 126.1 kg 123.7 kg    Data Reviewed: I have personally reviewed and interpreted daily labs, tele strips, imagings as discussed above. I reviewed all nursing notes, pharmacy notes, vitals, pertinent old records I have discussed plan of care  as described above with RN and patient/family.  CBC: Recent Labs  Lab 10/03/24 0628 10/04/24 0802 10/06/24 0524 10/07/24 0449 10/08/24 0527  WBC 8.5 7.9 6.9 7.0 6.1  NEUTROABS  --   --  4.6 4.1  --   HGB 10.0* 9.8* 9.8* 8.7* 8.5*  HCT 31.4* 30.9* 31.2* 27.6* 27.2*  MCV 94.0 94.5 94.0 94.2 93.8  PLT 85* 102* 129* 143* 149*   Basic Metabolic Panel: Recent Labs  Lab 10/02/24 0627 10/03/24 0628 10/04/24 0802 10/05/24 1410 10/06/24 0524 10/07/24 1430 10/08/24 0527  NA 140   < > 137 137 136 132* 132*  K 4.0   < > 4.0 4.2 4.4 4.7 4.3  CL 104   < > 105 103 102 99 96*  CO2 22   < > 22 24 25  21* 25  GLUCOSE 203*   < > 277* 255* 229* 189* 208*  BUN 66*   < > 75* 57* 51* 71* 43*  CREATININE  4.73*   < > 5.39* 3.89* 4.33* 6.45* 4.78*  CALCIUM  7.6*   < > 7.6* 8.0* 8.1* 7.7* 7.9*  MG  --   --  1.9  --   --   --  1.9  PHOS 5.0*  --   --   --  4.2 5.6* 4.2   < > = values in this interval not displayed.    Studies: DG Abd 1 View Result Date: 10/07/2024 EXAM: 1 VIEW XRAY OF THE ABDOMEN 10/07/2024 07:17:00 PM COMPARISON: XR Abdomen 09/29/2024, CT Abdomen/Pelvis 09/30/2024. CLINICAL HISTORY: SBO (small bowel obstruction) (HCC). FINDINGS: BOWEL: Diffusely gas-filled, although not overtly distended, small bowel and colon throughout abdomen and pelvis. nonobstructive bowel gas pattern. SOFT TISSUES: No abnormal calcifications. BONES: Postoperative changes in lower thoracic spine. No acute fracture. LUNGS: Bilateral lung base opacity. IMPRESSION: 1. Nonobstructive bowel gas pattern. 2. Bilateral lung base opacity. Electronically signed by: Morgane Naveau MD 10/07/2024 07:24 PM EST RP Workstation: [id number]    Scheduled Meds:  bisacodyl   10 mg Oral QHS   calcitRIOL   0.5 mcg Oral Daily   carvedilol   6.25 mg Oral BID WC   Chlorhexidine  Gluconate Cloth  6 each Topical Q0600   cyanocobalamin   1,000 mcg Oral Daily   diltiazem   240 mg Oral QPM   doxepin   10 mg Oral QHS   famotidine   10 mg Oral Daily   folic acid   1 mg Oral Daily   gabapentin   300 mg Oral QHS   heparin   5,000 Units Subcutaneous Q8H   insulin  aspart  0-5 Units Subcutaneous QHS   insulin  aspart  0-6 Units Subcutaneous TID WC   insulin  aspart  9 Units Subcutaneous TID WC   insulin  glargine  22 Units Subcutaneous BID   levothyroxine   175 mcg Oral QHS   linaclotide   145 mcg Oral QAC breakfast   loratadine   10 mg Oral Daily   mupirocin  ointment  1 Application Nasal BID   nicotine   14 mg Transdermal Daily   pantoprazole   40 mg Oral Daily   polyethylene glycol  17 g Oral BID   senna-docusate  1 tablet Oral BID   simethicone   80 mg Oral QID   Continuous Infusions:  anticoagulant sodium citrate      vancomycin  1,250 mg (10/07/24  2111)   PRN Meds: acetaminophen  **OR** acetaminophen , albuterol , anticoagulant sodium citrate , bisacodyl , guaiFENesin , liver oil-zinc  oxide, nicotine  polacrilex, ondansetron  **OR** ondansetron  (ZOFRAN ) IV, oxyCODONE , sodium phosphate   Time spent: 55 minutes  Author: ELVAN SOR. MD [facility]  10/08/2024 3:30 PM  To reach On-call, see care teams to locate the attending and reach out to them via [email or web address]. If 7PM-7AM, please contact night-coverage If you still have difficulty reaching the attending provider, please page the [facility] (Director on Call) for [facility] on amion for assistance.

## 2024-10-08 NOTE — Progress Notes (Signed)
 The patient has refused to be turned and repositioned most of the shift. This clinical research associate educated the patient on the importance of being turned and repositioned. The patient told this writer that he understood.

## 2024-10-08 NOTE — TOC Progression Note (Signed)
 Transition of Care [facility]) - Progression Note    Patient Details  Name: Claiborne Stroble MRN: [id number] Date of Birth: [birth year]  [facility]) CM/SW Contact  Dalia GORMAN Fuse, RN Phone Number: 10/08/2024, 9:44 AM  Clinical Narrative:    Will likely need 6 weeks of antibiotics given likely cannot undergo TEE, cardiology consulted to eval for TEE. Plan for KUB to r/o SBO. On O2 @ 6 L [place].  Plan to dc to LTC at [facility] when medically appropriate.                     Expected Discharge Plan and Services                                               Social Drivers of Health (SDOH) Interventions SDOH Screenings   Food Insecurity: No Food Insecurity (09/29/2024)  Housing: Low Risk (09/29/2024)  Transportation Needs: No Transportation Needs (09/29/2024)  Utilities: Not At Risk (09/29/2024)  Social Connections: Socially Isolated (09/29/2024)  Tobacco Use: Medium Risk (09/29/2024)    Readmission Risk Interventions    05/05/2024    3:05 PM  Readmission Risk Prevention Plan  Transportation Screening Complete  Medication Review (RN Care Manager) Complete  PCP or Specialist appointment within 3-5 days of discharge Complete  SW Recovery Care/Counseling Consult Complete  Palliative Care Screening Not Applicable  Skilled Nursing Facility Complete

## 2024-10-08 NOTE — Consult Note (Addendum)
 " [facility] CLINIC CARDIOLOGY CONSULT NOTE       Patient ID: Eugene [name] MRN: [id number] DOB/AGE: October 19, 1960 64 y.o.  Admit date: 09/29/2024 Referring Physician Dr. Elvan Sor Primary Physician [facility]  Primary Cardiologist None (seen by our group during prior hospitalization) Reason for Consultation Need for TEE  HPI: Eugene [name] is a 64 y.o. male  with a past medical history of morbid obesity, chronic hypoxic respiratory failure on 6L, end-stage renal disease on hemodialysis, insulin -dependent diabetes mellitus 2, hypothyroidism, hypertension  who presented to the ED on 09/29/2024 for nausea/vomiting.  Found to have positive blood cultures growing MRSA.  ID requesting TEE.  Cardiology was consulted for further evaluation.   Patient initially presented with complaints of SOB, nausea and vomiting.  Has been treated for sepsis secondary to MRSA bacteremia, suspected to be from HD catheter.  Labs today notable for creatinine 4.78, potassium 4.3, hemoglobin 8.5, WBC 6.1, platelets 149. EKG 09/29/2024 sinus tachycardia rate 101 bpm.  Has been treated with IV antibiotics since admission.  At the time of evaluation this morning, patient is resting in hospital bed.  We discussed his initial presentation in further detail.  States that he got sick about 2 weeks ago with nausea and vomiting.  Denied any diarrhea.  States that he did have some shortness of breath and lightheadedness with the episodes of nausea and vomiting.  Denies any episodes of syncope.  Currently he has no cardiac complaints.  States that he does have some back pain which is chronic as well as some stomach pain.    Review of systems complete and found to be negative unless listed above    Past Medical History:  Diagnosis Date   Anxiety    Depression    Diabetes mellitus without complication (HCC)    End stage renal disease (HCC)    Hepatitis C    Hyperlipidemia    IBS (irritable bowel syndrome)     Osteomyelitis (HCC)    Spinal stenosis     Past Surgical History:  Procedure Laterality Date   BACK SURGERY     bilateral amputation Bilateral    BKA Bilateral    CENTRAL LINE INSERTION-TUNNELED N/A 02/02/2019   Procedure: CENTRAL LINE INSERTION-TUNNELED;  Surgeon: Marea Selinda RAMAN, MD;  Location: [facility] INVASIVE CV LAB;  Service: Cardiovascular;  Laterality: N/A;   COLONOSCOPY WITH PROPOFOL  N/A 01/12/2020   Procedure: COLONOSCOPY WITH PROPOFOL ;  Surgeon: Jinny Carmine, MD;  Location: [facility] ENDOSCOPY;  Service: Endoscopy;  Laterality: N/A;   DIALYSIS/PERMA CATHETER INSERTION N/A 05/22/2024   Procedure: DIALYSIS/PERMA CATHETER INSERTION;  Surgeon: Jama Cordella MATSU, MD;  Location: [facility] INVASIVE CV LAB;  Service: Cardiovascular;  Laterality: N/A;   DIALYSIS/PERMA CATHETER INSERTION N/A 07/21/2024   Procedure: DIALYSIS/PERMA CATHETER INSERTION;  Surgeon: Marea Selinda RAMAN, MD;  Location: [facility] INVASIVE CV LAB;  Service: Cardiovascular;  Laterality: N/A;   DIALYSIS/PERMA CATHETER INSERTION N/A 10/05/2024   Procedure: DIALYSIS/PERMA CATHETER INSERTION;  Surgeon: Marea Selinda RAMAN, MD;  Location: [facility] INVASIVE CV LAB;  Service: Cardiovascular;  Laterality: N/A;   DIALYSIS/PERMA CATHETER REMOVAL N/A 10/01/2024   Procedure: DIALYSIS/PERMA CATHETER REMOVAL;  Surgeon: Marea Selinda RAMAN, MD;  Location: [facility] INVASIVE CV LAB;  Service: Cardiovascular;  Laterality: N/A;   ESOPHAGOGASTRODUODENOSCOPY (EGD) WITH PROPOFOL  N/A 12/07/2019   Procedure: ESOPHAGOGASTRODUODENOSCOPY (EGD) WITH PROPOFOL ;  Surgeon: Unk Corinn Skiff, MD;  Location: [facility] ENDOSCOPY;  Service: Gastroenterology;  Laterality: N/A;   IR CATHETER TUBE CHANGE  12/04/2019   SPINAL FUSION  THORACIC SPINE SURGERY  01/2019   extensive washout    Medications Prior to Admission  Medication Sig Dispense Refill Last Dose/Taking   acetaminophen  (TYLENOL ) 325 MG tablet Take 650 mg by mouth in the morning and at bedtime.   09/29/2024 at  9:00 AM   bisacodyl  (DULCOLAX) 10  MG suppository Place 10 mg rectally daily as needed for moderate constipation.   09/28/2024 at  9:00 PM   bisacodyl  (DULCOLAX) 5 MG EC tablet Take 2 tablets (10 mg total) by mouth at bedtime.   Unknown   carvedilol  (COREG ) 6.25 MG tablet Take 1 tablet (6.25 mg total) by mouth 2 (two) times daily with a meal. (Patient taking differently: Take 6.25 mg by mouth 2 (two) times daily with a meal. Tuesday, Thursday, Saturday and Sunday)   09/29/2024 at  9:00 AM   cetirizine (ZYRTEC) 10 MG tablet Take 10 mg by mouth daily.   09/29/2024 at  9:00 AM   cyanocobalamin  1000 MCG tablet Take 1 tablet (1,000 mcg total) by mouth daily.   09/29/2024 at  9:00 AM   diltiazem  (CARDIZEM  CD) 240 MG 24 hr capsule Take 1 capsule (240 mg total) by mouth every evening. (Patient taking differently: Take 240 mg by mouth every evening. Tuesday, Thursday, Saturday and Sunday only)   09/29/2024 at  9:00 AM   Doxepin  HCl 6 MG TABS Take 6 mg by mouth at bedtime.   09/28/2024 at  9:00 PM   famotidine  (PEPCID ) 10 MG tablet Take 1 tablet (10 mg total) by mouth daily.   09/29/2024 at  9:00 AM   folic acid  (FOLVITE ) 1 MG tablet Take 1 tablet (1 mg total) by mouth daily.   09/29/2024 at  9:00 AM   furosemide  (LASIX ) 80 MG tablet Take 1 tablet (80 mg total) by mouth daily. (Patient taking differently: Take 40 mg by mouth. One time dose for SOB/CHF)   09/29/2024 at  9:00 AM   gabapentin  (NEURONTIN ) 300 MG capsule Take 300 mg by mouth at bedtime.   09/28/2024 at  9:00 PM   guaifenesin  (ROBITUSSIN) 100 MG/5ML syrup Take 200 mg by mouth 3 (three) times daily as needed for cough.   Unknown   insulin  glargine-yfgn (SEMGLEE ) 100 UNIT/ML injection Inject 0.1 mLs (10 Units total) into the skin 2 (two) times daily. (Patient taking differently: Inject 20 Units into the skin 2 (two) times daily.)   09/29/2024 at  8:00 AM   insulin  lispro (HUMALOG ) 100 UNIT/ML injection Inject 0-15 Units into the skin 4 (four) times daily -  before meals and at bedtime. <150= 0 units 151-200=  3 units 201-250= 6 units 251-300= 9 units 301-350= 12 units 351-400= 15 units >400 call MD (Patient taking differently: Inject 0-15 Units into the skin 4 (four) times daily -  before meals and at bedtime. <150= 0 units 151-200= 3 units 201-250= 6 units 251-300= 9 units 301-350= 12 units 351-400= 15 units >400 call MD)   09/29/2024 at  8:00 AM   ipratropium-albuterol  (DUONEB) 0.5-2.5 (3) MG/3ML SOLN Take 3 mLs by nebulization every 6 (six) hours as needed.   09/29/2024 at  5:31 AM   Lactulose  20 GM/30ML SOLN Take 20 mLs by mouth 2 (two) times daily.   09/29/2024 at  9:00 AM   levothyroxine  (SYNTHROID ) 175 MCG tablet Take 175 mcg by mouth at bedtime.   09/28/2024 at  9:00 PM   linaclotide  (LINZESS ) 145 MCG CAPS capsule Take 1 capsule (145 mcg total) by mouth daily before breakfast.  30 capsule 0 09/29/2024 at  6:00 AM   liver oil-zinc  oxide (DESITIN) 40 % ointment Apply 1 Application topically as needed for irritation.   Unknown   Melatonin 5 MG TABS Take 10 mg by mouth at bedtime.   09/28/2024 at  9:00 PM   metoCLOPramide  (REGLAN ) 10 MG tablet Take 0.5 tablets (5 mg total) by mouth every 8 (eight) hours as needed for nausea or vomiting.   09/29/2024 at  6:00 AM   midodrine  (PROAMATINE ) 5 MG tablet Take 20 mg by mouth. Monday, Wednesday and Friday   09/28/2024 at  8:00 AM   ondansetron  (ZOFRAN ) 4 MG tablet Take 4 mg by mouth every 8 (eight) hours as needed for nausea or vomiting.   09/29/2024   oxyCODONE  (ROXICODONE ) 15 MG immediate release tablet Take 7.5 mg by mouth every 6 (six) hours as needed.   09/28/2024 at  8:18 PM   polyethylene glycol (MIRALAX  / GLYCOLAX ) 17 g packet Take 17 g by mouth 2 (two) times daily. 14 each 0 09/29/2024 at  9:00 AM   predniSONE  (DELTASONE ) 10 MG tablet Take 10 mg by mouth daily with breakfast.   09/29/2024 at  6:00 AM   predniSONE  (DELTASONE ) 20 MG tablet Take 40 mg by mouth daily with breakfast. One time dose for STAT SOB/COPD   09/29/2024 at 11:27 AM   senna-docusate (SENOKOT-S)  8.6-50 MG tablet Take 1 tablet by mouth 2 (two) times daily.   09/29/2024 at  9:00 AM   simethicone  (MYLICON) 80 MG chewable tablet Chew 1 tablet (80 mg total) by mouth 4 (four) times daily.   09/29/2024 at  9:00 AM   sitaGLIPtin (JANUVIA) 25 MG tablet Take 25 mg by mouth daily.   09/29/2024 at  9:00 AM   Vitamin D , Ergocalciferol , (DRISDOL ) 1.25 MG (50000 UNIT) CAPS capsule Take 1 capsule (50,000 Units total) by mouth every 7 (seven) days.   09/24/2024   Social History   Socioeconomic History   Marital status: Single    Spouse name: Not on file   Number of children: Not on file   Years of education: Not on file   Highest education level: Not on file  Occupational History   Not on file  Tobacco Use   Smoking status: Former    Current packs/day: 0.25    Types: Cigarettes   Smokeless tobacco: Never  Vaping Use   Vaping status: Every Day   Substances: Nicotine   Substance and Sexual Activity   Alcohol use: Not Currently   Drug use: Not Currently   Sexual activity: Not Currently  Other Topics Concern   Not on file  Social History Narrative   Resides at [employer]   Social Drivers of Health   Tobacco Use: Medium Risk (09/29/2024)   Patient History    Smoking Tobacco Use: Former    Smokeless Tobacco Use: Never    Passive Exposure: Not on Actuary Strain: Not on file  Food Insecurity: No Food Insecurity (09/29/2024)   Epic    Worried About Radiation Protection Practitioner of Food in the Last Year: Never true    Ran Out of Food in the Last Year: Never true  Transportation Needs: No Transportation Needs (09/29/2024)   Epic    Lack of Transportation (Medical): No    Lack of Transportation (Non-Medical): No  Physical Activity: Not on file  Stress: Not on file  Social Connections: Socially Isolated (09/29/2024)   Social Connection and Isolation Panel    Frequency of  Communication with Friends and Family: More than three times a week    Frequency of Social Gatherings with Friends and  Family: Once a week    Attends Religious Services: Never    Database Administrator or Organizations: No    Attends Banker Meetings: Never    Marital Status: Never married  Intimate Partner Violence: Not At Risk (09/29/2024)   Epic    Fear of Current or Ex-Partner: No    Emotionally Abused: No    Physically Abused: No    Sexually Abused: No  Depression (PHQ2-9): Not on file  Alcohol Screen: Not on file  Housing: Low Risk (09/29/2024)   Epic    Unable to Pay for Housing in the Last Year: No    Number of Times Moved in the Last Year: 0    Homeless in the Last Year: No  Utilities: Not At Risk (09/29/2024)   Epic    Threatened with loss of utilities: No  Health Literacy: Not on file    Family History  Problem Relation Age of Onset   Kidney failure Father    Kidney failure Brother      Vitals:   10/07/24 2100 10/08/24 0432 10/08/24 0744 10/08/24 0817  BP:  121/64  136/61  Pulse:  76  75  Resp:  18  14  Temp:  98.6 F (37 C)  98.3 F (36.8 C)  TempSrc:      SpO2: 93% 95% 92% 98%  Weight:      Height:        PHYSICAL EXAM General: Chronically ill-appearing male, well nourished, in no acute distress. HEENT: Normocephalic and atraumatic. Neck: No JVD.  Lungs: Normal respiratory effort on 8L [place]. Clear bilaterally to auscultation. No wheezes, crackles, rhonchi.  Heart: HRRR. Normal S1 and S2 without gallops or murmurs.  Abdomen: Non-distended appearing.  Msk: Normal strength and tone for age. Extremities: Warm and well perfused. No clubbing, cyanosis.  Chronic edema.  Neuro: Alert and oriented X 3. Psych: Answers questions appropriately.   Labs: Basic Metabolic Panel: Recent Labs    10/07/24 1430 10/08/24 0527  NA 132* 132*  K 4.7 4.3  CL 99 96*  CO2 21* 25  GLUCOSE 189* 208*  BUN 71* 43*  CREATININE 6.45* 4.78*  CALCIUM  7.7* 7.9*  MG  --  1.9  PHOS 5.6* 4.2   Liver Function Tests: Recent Labs    10/06/24 0524 10/07/24 1430  ALBUMIN  2.6* 2.5*    No results for input(s): LIPASE, AMYLASE in the last 72 hours. CBC: Recent Labs    10/06/24 0524 10/07/24 0449 10/08/24 0527  WBC 6.9 7.0 6.1  NEUTROABS 4.6 4.1  --   HGB 9.8* 8.7* 8.5*  HCT 31.2* 27.6* 27.2*  MCV 94.0 94.2 93.8  PLT 129* 143* 149*   Cardiac Enzymes: No results for input(s): CKTOTAL, CKMB, CKMBINDEX, TROPONINIHS in the last 72 hours. BNP: No results for input(s): BNP in the last 72 hours. D-Dimer: No results for input(s): DDIMER in the last 72 hours. Hemoglobin A1C: No results for input(s): HGBA1C in the last 72 hours. Fasting Lipid Panel: No results for input(s): CHOL, HDL, LDLCALC, TRIG, CHOLHDL, LDLDIRECT in the last 72 hours. Thyroid Function Tests: No results for input(s): TSH, T4TOTAL, T3FREE, THYROIDAB in the last 72 hours.  Invalid input(s): FREET3 Anemia Panel: Recent Labs    10/07/24 1041  VITAMINB12 962*  FOLATE 13.8  TIBC 182*  IRON  48     Radiology: DG Abd  1 View Result Date: 10/07/2024 EXAM: 1 VIEW XRAY OF THE ABDOMEN 10/07/2024 07:17:00 PM COMPARISON: XR Abdomen 09/29/2024, CT Abdomen/Pelvis 09/30/2024. CLINICAL HISTORY: SBO (small bowel obstruction) (HCC). FINDINGS: BOWEL: Diffusely gas-filled, although not overtly distended, small bowel and colon throughout abdomen and pelvis. nonobstructive bowel gas pattern. SOFT TISSUES: No abnormal calcifications. BONES: Postoperative changes in lower thoracic spine. No acute fracture. LUNGS: Bilateral lung base opacity. IMPRESSION: 1. Nonobstructive bowel gas pattern. 2. Bilateral lung base opacity. Electronically signed by: Morgane Naveau MD 10/07/2024 07:24 PM EST RP Workstation: [id number]   CT Angio Chest Pulmonary Embolism (PE) W or WO Contrast Result Date: 10/06/2024 CLINICAL DATA:  Possible sepsis.  Evaluate for pulmonary embolism. EXAM: CT ANGIOGRAPHY CHEST WITH CONTRAST TECHNIQUE: Multidetector CT imaging of the chest was performed using the standard  protocol during bolus administration of intravenous contrast. Multiplanar CT image reconstructions and MIPs were obtained to evaluate the vascular anatomy. RADIATION DOSE REDUCTION: This exam was performed according to the departmental dose-optimization program which includes automated exposure control, adjustment of the mA and/or kV according to patient size and/or use of iterative reconstruction technique. CONTRAST:  OMNIPAQUE  IOHEXOL  350 MG/ML SOLN COMPARISON:  CT 06/23/2024, 05/11/2024 FINDINGS: Cardiovascular: Mild stable cardiomegaly. Mild calcified plaque over the left main and 3 vessel coronary arteries. Thoracic aorta is normal in caliber. Minimal calcified plaque over the thoracic aorta. Pulmonary arterial system is adequately opacified without definite pulmonary emboli. Remaining vascular structures are unremarkable. Mediastinum/Nodes: 1 cm right paratracheal lymph node likely reactive. No other concerning mediastinal or hilar adenopathy. Mediastinal structures are otherwise unremarkable. Lungs/Pleura: Small to moderate bilateral pleural effusions left greater than right with associated compressive atelectasis within the lung bases as these findings are slightly worse to unchanged. There is patchy hazy airspace attenuation over the mid to upper lungs with areas of mild nodularity new since the prior exam and may represent an acute infectious or inflammatory process. Obstruction of a left posterior basilar bronchus. Mild debris along the right lateral wall of the trachea. Upper Abdomen: No acute findings of the visualized upper abdomen. Musculoskeletal: Stable bilateral changes suggesting gynecomastia. Spinal stabilization hardware over the thoracic spine bridging corpectomy of 2 vertebral body levels over the lower thoracic spine. Review of the MIP images confirms the above findings. IMPRESSION: 1. No evidence of pulmonary embolism. 2. Small to moderate bilateral pleural effusions left greater than  right with associated compressive atelectasis within the lung bases as these findings are slightly worse to unchanged. 3. Patchy hazy airspace attenuation over the mid to upper lungs with areas of mild nodularity new since the prior exam and may represent an acute infectious or inflammatory process. 4. Aortic atherosclerosis. Atherosclerotic coronary artery disease. Aortic Atherosclerosis (ICD10-I70.0). Electronically Signed   By: Toribio Agreste M.D.   On: 10/06/2024 15:13   PERIPHERAL VASCULAR CATHETERIZATION Result Date: 10/05/2024 See surgical note for result.  DG Chest Port 1 View Result Date: 10/03/2024 EXAM: 1 VIEW(S) XRAY OF THE CHEST 10/03/2024 04:21:00 PM COMPARISON: 09/29/2024 CLINICAL HISTORY: Oxygen desaturation FINDINGS: LINES, TUBES AND DEVICES: Right internal jugular hemodialysis catheter removed. LUNGS AND PLEURA: Prominent vasculature slightly increased interstitial markings . Stable left lung base consolidation and small loculated left pleural effusion. Stable right basilar atelectasis. Coarsening interstitial markings. Right costophrenic angle collimated off-view No pneumothorax. HEART AND MEDIASTINUM: Stable cardiomegaly. Unchanged cardiomediastinal silhouette. BONES AND SOFT TISSUES: Thoracolumbar fusion hardware noted. No acute osseous abnormality. IMPRESSION: 1. Mild pulmonary edema. 2. Stable left lung base consolidation and small loculated left pleural effusion. Electronically  signed by: Kate Plummer MD MD 10/03/2024 07:17 PM EST RP Workstation: [id number]   ECHOCARDIOGRAM COMPLETE Result Date: 10/02/2024    ECHOCARDIOGRAM REPORT   [name] Date of Exam: 10/02/2024 Medical Rec #:  [id number]      Height:       70.0 in Accession #:    [id number]     Weight:       272.5 lb Date of Birth:  11/29/1960      BSA:          2.381 m Patient Age:    63 years       BP:           147/68 mmHg Patient Gender: M              HR:           79 bpm. Exam Location:  [facility] Procedure:  2D Echo, Cardiac Doppler and Color Doppler (Both Spectral and Color            Flow Doppler were utilized during procedure). Indications:     Bacteremia R78.81  History:         Patient has prior history of Echocardiogram examinations, most                  recent 04/19/2024. Risk Factors:Dyslipidemia and Diabetes. End                  stage renal disease.  Sonographer:     Christopher Furnace Referring Phys:  [id number] DONALD BERLIN Diagnosing Phys: Caron Poser  Sonographer Comments: Technically challenging study due to limited acoustic windows, no apical window and no subcostal window. Image acquisition challenging due to patient body habitus. IMPRESSIONS  1. Very technically difficult study.  2. The mitral valve was not well visualized. No evidence of mitral valve regurgitation. No evidence of mitral stenosis.  3. The aortic valve has an indeterminant number of cusps. Aortic valve regurgitation is not visualized. No aortic stenosis is present.  4. Left ventricular ejection fraction, by estimation, is 55 to 60% based on parasternal windows only. The left ventricle probably has normal function, though very poorly visualized. Left ventricular endocardial border not optimally defined to evaluate regional wall motion. Left ventricular diastolic parameters are indeterminate.  5. Right ventricular systolic function was not well visualized. The right ventricular size is not well visualized. Comparison(s): A prior study was performed on 04/19/2024. No significant change, though unable to accurately compare due to poor image quality. FINDINGS  Left Ventricle: Left ventricular ejection fraction, by estimation, is 55 to 60%. The left ventricle has normal function. Left ventricular endocardial border not optimally defined to evaluate regional wall motion. The left ventricular internal cavity size was normal in size. Suboptimal image quality limits for assessment of left ventricular hypertrophy. Left ventricular diastolic  parameters are indeterminate. Right Ventricle: The right ventricular size is not well visualized. Right vetricular wall thickness was not well visualized. Right ventricular systolic function was not well visualized. Left Atrium: Left atrial size was not well visualized. Right Atrium: Right atrial size was not well visualized. Pericardium: There is no evidence of pericardial effusion. Mitral Valve: The mitral valve was not well visualized. No evidence of mitral valve regurgitation. No evidence of mitral valve stenosis. Tricuspid Valve: The tricuspid valve is not well visualized. Tricuspid valve regurgitation is not demonstrated. No evidence of tricuspid stenosis. Aortic Valve: The aortic valve has an indeterminant number of cusps. Aortic valve regurgitation is  not visualized. No aortic stenosis is present. Pulmonic Valve: The pulmonic valve was not well visualized. Pulmonic valve regurgitation is not visualized. No evidence of pulmonic stenosis. Aorta: The aortic root was not well visualized. IAS/Shunts: The interatrial septum was not well visualized.  LEFT VENTRICLE PLAX 2D LVIDd:         3.07 cm LVIDs:         1.93 cm LV PW:         2.62 cm LV IVS:        1.54 cm LVOT diam:     2.10 cm LVOT Area:     3.46 cm  LEFT ATRIUM         Index LA diam:    3.30 cm 1.39 cm/m   AORTA Ao Root diam: 3.70 cm  SHUNTS Systemic Diam: 2.10 cm Caron Poser Electronically signed by Caron Poser Signature Date/Time: 10/02/2024/2:07:14 PM    Final    PERIPHERAL VASCULAR CATHETERIZATION Result Date: 10/01/2024 See surgical note for result.  CT ABDOMEN PELVIS WO CONTRAST Result Date: 09/30/2024 EXAM: CT ABDOMEN AND PELVIS WITHOUT CONTRAST 09/30/2024 02:32:43 AM TECHNIQUE: CT of the abdomen and pelvis was performed without the administration of intravenous contrast. Multiplanar reformatted images are provided for review. Automated exposure control, iterative reconstruction, and/or weight-based adjustment of the mA/kV was utilized to  reduce the radiation dose to as low as reasonably achievable. COMPARISON: 05/11/2024 CLINICAL HISTORY: Abdominal pain, acute, nonlocalized. FINDINGS: LOWER CHEST: Small bilateral pleural effusions. Bilateral rounded lower lobe airspace opacities could reflect rounded atelectasis or pneumonia. LIVER: The liver is unremarkable. GALLBLADDER AND BILE DUCTS: Gallbladder is unremarkable. No biliary ductal dilatation. SPLEEN: No acute abnormality. PANCREAS: No acute abnormality. ADRENAL GLANDS: No acute abnormality. KIDNEYS, URETERS AND BLADDER: No stones in the kidneys or ureters. No hydronephrosis. No perinephric or periureteral stranding. Suprapubic Foley catheter in place with urinary bladder decompressed. GI AND BOWEL: Stomach demonstrates no acute abnormality. Rectosigmoid wall thickening concerning for proctocolitis. No bowel obstruction. Normal appendix. PERITONEUM AND RETROPERITONEUM: No ascites. No free air. VASCULATURE: Aorta is normal in caliber. Aortic atherosclerosis. LYMPH NODES: No lymphadenopathy. REPRODUCTIVE ORGANS: No acute abnormality. BONES AND SOFT TISSUES: Postoperative changes in the thoracic spine. Stable mild chronic compression fracture at T12 and L1. Umbilical hernia containing fat, stable. No focal soft tissue abnormality. IMPRESSION: 1. Rectosigmoid wall thickening concerning for proctocolitis. No bowel obstruction. Normal appendix. 2. Small bilateral pleural effusions. 3. Bilateral rounded lower lobe airspace opacities, possibly representing rounded atelectasis or pneumonia. Electronically signed by: Franky Crease MD 09/30/2024 02:45 AM EST RP Workstation: [id number]   DG Abd 2 Views Result Date: 09/29/2024 CLINICAL DATA:  Abdominal pain EXAM: DG ABDOMEN 2V COMPARISON:  05/12/2024 FINDINGS: Extensive hardware in the thoracic spine. Left greater than right pleural effusions and basilar airspace disease. Multiple moderate air-filled dilated loops of small bowel measuring up to 4.2 cm.  IMPRESSION: 1. Moderate diffuse air distension of the bowel, potentially due to ileus with partial or developing bowel obstruction not excluded. Consider radiographic follow-up. 2. Left greater than right pleural effusions and basilar airspace disease. Electronically Signed   By: Luke Bun M.D.   On: 09/29/2024 17:00   DG Chest Port 1 View Result Date: 09/29/2024 CLINICAL DATA:  Desaturation EXAM: PORTABLE CHEST 1 VIEW COMPARISON:  08/03/2024 FINDINGS: Right-sided central venous catheter tip at the cavoatrial region. Posterior spinal fusion hardware and interbody cage device. Cardiomegaly with vascular congestion and suspected mild interstitial edema. Left greater than right pleural effusions. IMPRESSION: Cardiomegaly with vascular congestion  and suspected mild interstitial edema. Left greater than right pleural effusions. Electronically Signed   By: Luke Bun M.D.   On: 09/29/2024 15:21    ECHO as above   TELEMETRY (personally reviewed): Not on telemetry  EKG (personally reviewed): Sinus tachycardia rate 101 bpm  Data reviewed by me 10/08/2024: last 24h vitals tele labs imaging I/O ED provider note, admission H&P  Principal Problem:   SIRS (systemic inflammatory response syndrome) (HCC) Active Problems:   Uncontrolled type 2 diabetes mellitus with hyperglycemia, with long-term current use of insulin  (HCC)   Chronic pain   Abdominal discomfort   Hypothyroidism   Paroxysmal SVT (supraventricular tachycardia)   ESRD (end stage renal disease) (HCC)   Chronic respiratory failure with hypoxia (HCC)   Chronic bilateral pleural effusions   Heart failure with preserved ejection fraction (HCC)   Chronic constipation   Obesity (BMI 30-39.9)   MRSA bacteremia    ASSESSMENT AND PLAN:  Eugene [name] is a 64 y.o. male  with a past medical history of morbid obesity, chronic hypoxic respiratory failure on 6L, end-stage renal disease on hemodialysis, insulin -dependent diabetes mellitus 2,  hypothyroidism, hypertension  who presented to the ED on 09/29/2024 for nausea/vomiting.  Found to have positive blood cultures growing MRSA.  ID requesting TEE.  Cardiology was consulted for further evaluation.   # MRSA bacteremia # Sepsis # ESRD on HD # Chronic hypoxic respiratory failure Patient admitted for sepsis, found to have MRSA bacteremia suspected source was HD catheter which was removed. Has hx of morbid obesity and chronic hypoxic respiratory failure on 6L. Given bacteremia, ID recommended TEE. - Case reviewed with Dr. Wilburn, given his body habitus and baseline O2 requirement he would be at high risk for complications from TEE procedure.  Risk would likely outweigh benefits in his case.  Given this he recommends empiric treatment with antibiotics which I have discussed with the infectious disease team as well as primary team. - Continue diltiazem  240 mg and carvedilol  6.25 mg twice daily - Further management per primary team, infectious disease.  Cardiology will sign off. Please haiku with questions or re-engage if needed. Follow up with Dr. Custovic in 1 month.   This patient's plan of care was discussed and created with Dr. Florencio and he is in agreement.  Signed: Danita Bloch, PA-C  10/08/2024, 11:16 AM [facility] Card[facility]      "

## 2024-10-09 ENCOUNTER — Inpatient Hospital Stay

## 2024-10-09 DIAGNOSIS — R6510 Systemic inflammatory response syndrome (SIRS) of non-infectious origin without acute organ dysfunction: Secondary | ICD-10-CM | POA: Diagnosis not present

## 2024-10-09 LAB — BASIC METABOLIC PANEL WITH GFR
Anion gap: 11 (ref 5–15)
BUN: 42 mg/dL — ABNORMAL HIGH (ref 8–23)
CO2: 25 mmol/L (ref 22–32)
Calcium: 8.2 mg/dL — ABNORMAL LOW (ref 8.9–10.3)
Chloride: 95 mmol/L — ABNORMAL LOW (ref 98–111)
Creatinine, Ser: 4.83 mg/dL — ABNORMAL HIGH (ref 0.61–1.24)
GFR, Estimated: 13 mL/min — ABNORMAL LOW
Glucose, Bld: 109 mg/dL — ABNORMAL HIGH (ref 70–99)
Potassium: 4.3 mmol/L (ref 3.5–5.1)
Sodium: 131 mmol/L — ABNORMAL LOW (ref 135–145)

## 2024-10-09 LAB — PHOSPHORUS: Phosphorus: 4.1 mg/dL (ref 2.5–4.6)

## 2024-10-09 LAB — MIC RESULT

## 2024-10-09 LAB — MINIMUM INHIBITORY CONC. (1 DRUG)

## 2024-10-09 LAB — CBC
HCT: 26.1 % — ABNORMAL LOW (ref 39.0–52.0)
Hemoglobin: 8.3 g/dL — ABNORMAL LOW (ref 13.0–17.0)
MCH: 29.6 pg (ref 26.0–34.0)
MCHC: 31.8 g/dL (ref 30.0–36.0)
MCV: 93.2 fL (ref 80.0–100.0)
Platelets: 198 K/uL (ref 150–400)
RBC: 2.8 MIL/uL — ABNORMAL LOW (ref 4.22–5.81)
RDW: 13.6 % (ref 11.5–15.5)
WBC: 7.2 K/uL (ref 4.0–10.5)
nRBC: 0 % (ref 0.0–0.2)

## 2024-10-09 LAB — GLUCOSE, CAPILLARY
Glucose-Capillary: 152 mg/dL — ABNORMAL HIGH (ref 70–99)
Glucose-Capillary: 96 mg/dL (ref 70–99)
Glucose-Capillary: 125 mg/dL — ABNORMAL HIGH (ref 70–99)

## 2024-10-09 LAB — MAGNESIUM: Magnesium: 2.1 mg/dL (ref 1.7–2.4)

## 2024-10-09 MED ORDER — HEPARIN SODIUM (PORCINE) 1000 UNIT/ML IJ SOLN
INTRAMUSCULAR | Status: AC
  Filled 2024-10-09: qty 4

## 2024-10-09 MED ORDER — ONDANSETRON HCL 4 MG/2ML IJ SOLN
INTRAMUSCULAR | Status: AC
  Filled 2024-10-09: qty 2

## 2024-10-09 MED ORDER — MINERAL OIL RE ENEM
1.0000 | ENEMA | Freq: Once | RECTAL | Status: AC
  Administered 2024-10-09: 1 via RECTAL

## 2024-10-09 NOTE — Progress Notes (Signed)
 " [facility]  ROUNDING NOTE   Subjective:   Eugene [name] is a 64 year old male with past medical conditions including diabetes, hepatitis C, bilateral BKA, anxiety and depression, and end-stage renal disease on hemodialysis.  Patient presents to the emergency department from his facility complaining of shortness of breath.  Patient is currently admitted for SIRS (systemic inflammatory response syndrome) (HCC) [R65.10]  Patient is known to our practice and receives outpatient dialysis treatments at [facility] on a MWF schedule, supervised by Dr. Dennise.    Update:  Patient seen during dialysis   HEMODIALYSIS FLOWSHEET:  Blood Flow Rate (mL/min): 0 mL/min Arterial Pressure (mmHg): -2.42 mmHg Venous Pressure (mmHg): 41.21 mmHg TMP (mmHg): 31.11 mmHg Ultrafiltration Rate (mL/min): 1883 mL/min Dialysate Flow Rate (mL/min): 299 ml/min Bolus Amount (mL): 100 mL  Resting comfortably    Objective:  Vital signs in last 24 hours:  Temp:  [98.4 F (36.9 C)-99.1 F (37.3 C)] 98.9 F (37.2 C) (01/16 1135) Pulse Rate:  [74-90] 75 (01/16 1135) Resp:  [15-18] 15 (01/16 1135) BP: (113-145)/(50-64) 113/53 (01/16 1135) SpO2:  [91 %-92 %] 92 % (01/16 0843) Weight:  [125.5 kg] 125.5 kg (01/16 1135)  Weight change:  Filed Weights   10/07/24 1316 10/07/24 1745 10/09/24 1135  Weight: 126.1 kg 123.7 kg 125.5 kg    Intake/Output: I/O last 3 completed shifts: In: 741.8 [P.O.:240; IV Piggyback:501.8] Out: -    Intake/Output this shift:  Total I/O In: 120 [P.O.:120] Out: -   Physical Exam: General: NAD  Head: Normocephalic  Eyes: Anicteric  Lungs:  Wheeze, 5 L HFNC  Heart: Regular rate and rhythm  Abdomen:  Soft, nontender  Extremities: Trace edema.  Bilateral BKA  Neurologic: Awake, alert, conversant  Skin: Warm,dry, no rash  Access: Lt internal jugular permcath    Basic Metabolic Panel: Recent Labs  Lab 10/04/24 0802 10/05/24 1410 10/06/24 0524  10/07/24 1430 10/08/24 0527  NA 137 137 136 132* 132*  K 4.0 4.2 4.4 4.7 4.3  CL 105 103 102 99 96*  CO2 22 24 25  21* 25  GLUCOSE 277* 255* 229* 189* 208*  BUN 75* 57* 51* 71* 43*  CREATININE 5.39* 3.89* 4.33* 6.45* 4.78*  CALCIUM  7.6* 8.0* 8.1* 7.7* 7.9*  MG 1.9  --   --   --  1.9  PHOS  --   --  4.2 5.6* 4.2    Liver Function Tests: Recent Labs  Lab 10/06/24 0524 10/07/24 1430  ALBUMIN  2.6* 2.5*   No results for input(s): LIPASE, AMYLASE in the last 168 hours.  No results for input(s): AMMONIA in the last 168 hours.  CBC: Recent Labs  Lab 10/03/24 0628 10/04/24 0802 10/06/24 0524 10/07/24 0449 10/08/24 0527  WBC 8.5 7.9 6.9 7.0 6.1  NEUTROABS  --   --  4.6 4.1  --   HGB 10.0* 9.8* 9.8* 8.7* 8.5*  HCT 31.4* 30.9* 31.2* 27.6* 27.2*  MCV 94.0 94.5 94.0 94.2 93.8  PLT 85* 102* 129* 143* 149*    Cardiac Enzymes: No results for input(s): CKTOTAL, CKMB, CKMBINDEX, TROPONINI in the last 168 hours.  BNP: Invalid input(s): POCBNP  CBG: Recent Labs  Lab 10/08/24 0819 10/08/24 1219 10/08/24 1551 10/08/24 1950 10/09/24 0736  GLUCAP 161* 132* 144* 189* 152*    Microbiology: Results for orders placed or performed during the hospital encounter of 09/29/24  Resp panel by RT-PCR (RSV, Flu A&B, Covid) Anterior Nasal Swab     Status: None   Collection Time: 09/29/24  2:16 PM   Specimen: Anterior Nasal Swab  Result Value Ref Range Status   SARS Coronavirus 2 by RT PCR NEGATIVE NEGATIVE Final    Comment: (NOTE) SARS-CoV-2 target nucleic acids are NOT DETECTED.  The SARS-CoV-2 RNA is generally detectable in upper respiratory specimens during the acute phase of infection. The lowest concentration of SARS-CoV-2 viral copies this assay can detect is 138 copies/mL. A negative result does not preclude SARS-Cov-2 infection and should not be used as the sole basis for treatment or other patient management decisions. A negative result may occur with   improper specimen collection/handling, submission of specimen other than nasopharyngeal swab, presence of viral mutation(s) within the areas targeted by this assay, and inadequate number of viral copies(<138 copies/mL). A negative result must be combined with clinical observations, patient history, and epidemiological information. The expected result is Negative.  Fact Sheet for Patients:  bloggercourse.com  Fact Sheet for Healthcare Providers:  seriousbroker.it  This test is no t yet approved or cleared by the [place]  FDA and  has been authorized for detection and/or diagnosis of SARS-CoV-2 by FDA under an Emergency Use Authorization (EUA). This EUA will remain  in effect (meaning this test can be used) for the duration of the COVID-19 declaration under Section 564(b)(1) of the Act, 21 U.S.C.section 360bbb-3(b)(1), unless the authorization is terminated  or revoked sooner.       Influenza A by PCR NEGATIVE NEGATIVE Final   Influenza B by PCR NEGATIVE NEGATIVE Final    Comment: (NOTE) The Xpert Xpress SARS-CoV-2/FLU/RSV plus assay is intended as an aid in the diagnosis of influenza from Nasopharyngeal swab specimens and should not be used as a sole basis for treatment. Nasal washings and aspirates are unacceptable for Xpert Xpress SARS-CoV-2/FLU/RSV testing.  Fact Sheet for Patients: bloggercourse.com  Fact Sheet for Healthcare Providers: seriousbroker.it  This test is not yet approved or cleared by the [place]  FDA and has been authorized for detection and/or diagnosis of SARS-CoV-2 by FDA under an Emergency Use Authorization (EUA). This EUA will remain in effect (meaning this test can be used) for the duration of the COVID-19 declaration under Section 564(b)(1) of the Act, 21 U.S.C. section 360bbb-3(b)(1), unless the authorization is terminated or revoked.      Resp Syncytial Virus by PCR NEGATIVE NEGATIVE Final    Comment: (NOTE) Fact Sheet for Patients: bloggercourse.com  Fact Sheet for Healthcare Providers: seriousbroker.it  This test is not yet approved or cleared by the [place]  FDA and has been authorized for detection and/or diagnosis of SARS-CoV-2 by FDA under an Emergency Use Authorization (EUA). This EUA will remain in effect (meaning this test can be used) for the duration of the COVID-19 declaration under Section 564(b)(1) of the Act, 21 U.S.C. section 360bbb-3(b)(1), unless the authorization is terminated or revoked.  Performed at [facility], [address], [place], [place] [address]   Urine Culture     Status: Abnormal   Collection Time: 09/29/24  2:17 PM   Specimen: Urine, Random  Result Value Ref Range Status   Specimen Description   Final    URINE, RANDOM Performed at [facility], [address] [place], [place] [address]    Special Requests   Final    NONE Reflexed from [id number] Performed at [facility], [address] [place], [place] [address]    Culture (A)  Final    >=100,000 COLONIES/mL PROTEUS MIRABILIS >=100,000 COLONIES/mL METHICILLIN RESISTANT STAPHYLOCOCCUS AUREUS    Report Status  10/02/2024 FINAL  Final   Organism ID, Bacteria PROTEUS MIRABILIS (A)  Final   Organism ID, Bacteria METHICILLIN RESISTANT STAPHYLOCOCCUS AUREUS (A)  Final      Susceptibility   Methicillin resistant staphylococcus aureus - MIC*    CIPROFLOXACIN  >=8 RESISTANT Resistant     GENTAMICIN <=0.5 SENSITIVE Sensitive     NITROFURANTOIN <=16 SENSITIVE Sensitive     OXACILLIN >=4 RESISTANT Resistant     TETRACYCLINE <=1 SENSITIVE Sensitive     VANCOMYCIN  1 SENSITIVE Sensitive     TRIMETH /SULFA  <=10 SENSITIVE Sensitive     RIFAMPIN  <=0.5 SENSITIVE Sensitive     Inducible Clindamycin NEGATIVE Sensitive     LINEZOLID  2 SENSITIVE Sensitive      * >=100,000 COLONIES/mL METHICILLIN RESISTANT STAPHYLOCOCCUS AUREUS   Proteus mirabilis - MIC*    AMPICILLIN <=2 SENSITIVE Sensitive     CEFAZOLIN  (URINE) Value in next row Sensitive      4 SENSITIVEThis is a modified FDA-approved test that has been validated and its performance characteristics determined by the reporting laboratory.  This laboratory is certified under the Clinical Laboratory Improvement Amendments CLIA as qualified to perform high complexity clinical laboratory testing.    CEFEPIME  Value in next row Sensitive      4 SENSITIVEThis is a modified FDA-approved test that has been validated and its performance characteristics determined by the reporting laboratory.  This laboratory is certified under the Clinical Laboratory Improvement Amendments CLIA as qualified to perform high complexity clinical laboratory testing.    ERTAPENEM Value in next row Sensitive      4 SENSITIVEThis is a modified FDA-approved test that has been validated and its performance characteristics determined by the reporting laboratory.  This laboratory is certified under the Clinical Laboratory Improvement Amendments CLIA as qualified to perform high complexity clinical laboratory testing.    CEFTRIAXONE  Value in next row Sensitive      4 SENSITIVEThis is a modified FDA-approved test that has been validated and its performance characteristics determined by the reporting laboratory.  This laboratory is certified under the Clinical Laboratory Improvement Amendments CLIA as qualified to perform high complexity clinical laboratory testing.    CIPROFLOXACIN  Value in next row Resistant      4 SENSITIVEThis is a modified FDA-approved test that has been validated and its performance characteristics determined by the reporting laboratory.  This laboratory is certified under the Clinical Laboratory Improvement Amendments CLIA as qualified to perform high complexity clinical laboratory testing.    GENTAMICIN Value in next row  Sensitive      4 SENSITIVEThis is a modified FDA-approved test that has been validated and its performance characteristics determined by the reporting laboratory.  This laboratory is certified under the Clinical Laboratory Improvement Amendments CLIA as qualified to perform high complexity clinical laboratory testing.    NITROFURANTOIN Value in next row Resistant      4 SENSITIVEThis is a modified FDA-approved test that has been validated and its performance characteristics determined by the reporting laboratory.  This laboratory is certified under the Clinical Laboratory Improvement Amendments CLIA as qualified to perform high complexity clinical laboratory testing.    TRIMETH /SULFA  Value in next row Sensitive      4 SENSITIVEThis is a modified FDA-approved test that has been validated and its performance characteristics determined by the reporting laboratory.  This laboratory is certified under the Clinical Laboratory Improvement Amendments CLIA as qualified to perform high complexity clinical laboratory testing.    AMPICILLIN/SULBACTAM Value in next row Sensitive  4 SENSITIVEThis is a modified FDA-approved test that has been validated and its performance characteristics determined by the reporting laboratory.  This laboratory is certified under the Clinical Laboratory Improvement Amendments CLIA as qualified to perform high complexity clinical laboratory testing.    PIP/TAZO Value in next row Sensitive      <=4 SENSITIVEThis is a modified FDA-approved test that has been validated and its performance characteristics determined by the reporting laboratory.  This laboratory is certified under the Clinical Laboratory Improvement Amendments CLIA as qualified to perform high complexity clinical laboratory testing.    MEROPENEM Value in next row Sensitive      <=4 SENSITIVEThis is a modified FDA-approved test that has been validated and its performance characteristics determined by the reporting  laboratory.  This laboratory is certified under the Clinical Laboratory Improvement Amendments CLIA as qualified to perform high complexity clinical laboratory testing.    * >=100,000 COLONIES/mL PROTEUS MIRABILIS  Blood Culture (routine x 2)     Status: Abnormal   Collection Time: 09/29/24  2:37 PM   Specimen: BLOOD  Result Value Ref Range Status   Specimen Description   Final    BLOOD BLOOD RIGHT FOREARM Performed at [facility], [address] [place], [place] [address]    Special Requests   Final    BOTTLES DRAWN AEROBIC AND ANAEROBIC Blood Culture results may not be optimal due to an inadequate volume of blood received in culture bottles Performed at [facility], [address], [place], [place] [address]    Culture  Setup Time   Final    GRAM POSITIVE COCCI IN BOTH AEROBIC AND ANAEROBIC BOTTLES CRITICAL VALUE NOTED.  VALUE IS CONSISTENT WITH PREVIOUSLY REPORTED AND CALLED VALUE.    Culture (A)  Final    STAPHYLOCOCCUS AUREUS SUSCEPTIBILITIES PERFORMED ON PREVIOUS CULTURE WITHIN THE LAST 5 DAYS. Performed at [facility], [address], [place], [place] [address]    Report Status 10/02/2024 FINAL  Final  Blood Culture (routine x 2)     Status: Abnormal (Preliminary result)   Collection Time: 09/29/24  2:37 PM   Specimen: BLOOD  Result Value Ref Range Status   Specimen Description   Final    BLOOD LEFT ANTECUBITAL Performed at [facility], [address] [place], [place] [address]    Special Requests   Final    BOTTLES DRAWN AEROBIC AND ANAEROBIC Blood Culture results may not be optimal due to an inadequate volume of blood received in culture bottles Performed at [facility], [address], [place], [place] [address]    Culture  Setup Time   Final    GRAM POSITIVE COCCI IN BOTH AEROBIC AND ANAEROBIC BOTTLES CRITICAL RESULT CALLED TO, READ BACK BY AND VERIFIED WITH: PHARMD NATHAN B ON [id number] @0457  BY HNM    Culture (A)   Final    METHICILLIN RESISTANT STAPHYLOCOCCUS AUREUS Sent to [facility] for further susceptibility testing. Performed at [facility], [address] [place], [place] [address]    Report Status PENDING  Incomplete   Organism ID, Bacteria METHICILLIN RESISTANT STAPHYLOCOCCUS AUREUS  Final      Susceptibility   Methicillin resistant staphylococcus aureus - MIC*    CIPROFLOXACIN  >=8 RESISTANT Resistant     ERYTHROMYCIN  >=8 RESISTANT Resistant     GENTAMICIN <=0.5 SENSITIVE Sensitive     OXACILLIN >=4 RESISTANT Resistant     TETRACYCLINE <=1 SENSITIVE Sensitive     VANCOMYCIN  1 SENSITIVE Sensitive  TRIMETH /SULFA  <=10 SENSITIVE Sensitive     CLINDAMYCIN >=8 RESISTANT Resistant     RIFAMPIN  <=0.5 SENSITIVE Sensitive     Inducible Clindamycin NEGATIVE Sensitive     LINEZOLID  2 SENSITIVE Sensitive     * METHICILLIN RESISTANT STAPHYLOCOCCUS AUREUS  Blood Culture ID Panel (Reflexed)     Status: Abnormal   Collection Time: 09/29/24  2:37 PM  Result Value Ref Range Status   Enterococcus faecalis NOT DETECTED NOT DETECTED Final   Enterococcus Faecium NOT DETECTED NOT DETECTED Final   Listeria monocytogenes NOT DETECTED NOT DETECTED Final   Staphylococcus species DETECTED (A) NOT DETECTED Final    Comment: CRITICAL RESULT CALLED TO, READ BACK BY AND VERIFIED WITHBETHA RANKIN DILLS PHARMD [id number] 09/30/24 HNM    Staphylococcus aureus (BCID) DETECTED (A) NOT DETECTED Final    Comment: Methicillin (oxacillin)-resistant Staphylococcus aureus (MRSA). MRSA is predictably resistant to beta-lactam antibiotics (except ceftaroline). Preferred therapy is vancomycin  unless clinically contraindicated. Patient requires contact precautions if  hospitalized. CRITICAL RESULT CALLED TO, READ BACK BY AND VERIFIED WITH: RANKIN DILLS PHARMD [id number] 09/30/24 HNM    Staphylococcus epidermidis NOT DETECTED NOT DETECTED Final   Staphylococcus lugdunensis NOT DETECTED NOT DETECTED Final   Streptococcus species NOT DETECTED  NOT DETECTED Final   Streptococcus agalactiae NOT DETECTED NOT DETECTED Final   Streptococcus pneumoniae NOT DETECTED NOT DETECTED Final   Streptococcus pyogenes NOT DETECTED NOT DETECTED Final   A.calcoaceticus-baumannii NOT DETECTED NOT DETECTED Final   Bacteroides fragilis NOT DETECTED NOT DETECTED Final   Enterobacterales NOT DETECTED NOT DETECTED Final   Enterobacter cloacae complex NOT DETECTED NOT DETECTED Final   Escherichia coli NOT DETECTED NOT DETECTED Final   Klebsiella aerogenes NOT DETECTED NOT DETECTED Final   Klebsiella oxytoca NOT DETECTED NOT DETECTED Final   Klebsiella pneumoniae NOT DETECTED NOT DETECTED Final   Proteus species NOT DETECTED NOT DETECTED Final   Salmonella species NOT DETECTED NOT DETECTED Final   Serratia marcescens NOT DETECTED NOT DETECTED Final   Haemophilus influenzae NOT DETECTED NOT DETECTED Final   Neisseria meningitidis NOT DETECTED NOT DETECTED Final   Pseudomonas aeruginosa NOT DETECTED NOT DETECTED Final   Stenotrophomonas maltophilia NOT DETECTED NOT DETECTED Final   Candida albicans NOT DETECTED NOT DETECTED Final   Candida auris NOT DETECTED NOT DETECTED Final   Candida glabrata NOT DETECTED NOT DETECTED Final   Candida krusei NOT DETECTED NOT DETECTED Final   Candida parapsilosis NOT DETECTED NOT DETECTED Final   Candida tropicalis NOT DETECTED NOT DETECTED Final   Cryptococcus neoformans/gattii NOT DETECTED NOT DETECTED Final   Meth resistant mecA/C and MREJ DETECTED (A) NOT DETECTED Final    Comment: CRITICAL RESULT CALLED TO, READ BACK BY AND VERIFIED WITHBETHA RANKIN DILLS PHARMD [id number] 09/30/24 HNM Performed at [facility], [address] [name], [place] [address]   MIC (1 Drug)-     Status: Abnormal   Collection Time: 09/29/24  2:37 PM  Result Value Ref Range Status   Min Inhibitory Conc (1 Drug) Final report (A)  Corrected    Comment: (NOTE) Performed At: [facility] [employer] [address] [place], [place]  [id number] Jennette Shorter MD Ey:[id number] CORRECTED ON 01/16 AT 0636: PREVIOUSLY REPORTED AS Preliminary report    Source BLOOD  Final    Comment: Performed at [facility], [address] [place], [place] [address]  MIC Result     Status: Abnormal   Collection Time: 09/29/24  2:37 PM  Result Value Ref Range Status  Result 1 (MIC) Comment (A)  Final    Comment: (NOTE) Methicillin - resistant Staphylococcus aureus Identification performed by account, not confirmed by this laboratory. DAPTOMYCIN     <=1 UG/ML = SUSCEPTIBLE Performed At: [facility] [address] [place], [place] [id number] Jennette Shorter MD Ey:[id number]   MRSA Next Gen by PCR, Nasal     Status: Abnormal   Collection Time: 09/30/24  5:00 PM   Specimen: Nasal Mucosa; Nasal Swab  Result Value Ref Range Status   MRSA by PCR Next Gen DETECTED (A) NOT DETECTED Final    Comment: RESULT CALLED TO, READ BACK BY AND VERIFIED WITH: TAMMY ORLANDO 2032 09/30/24 MU (NOTE) The GeneXpert MRSA Assay (FDA approved for NASAL specimens only), is one component of a comprehensive MRSA colonization surveillance program. It is not intended to diagnose MRSA infection nor to guide or monitor treatment for MRSA infections. Test performance is not FDA approved in patients less than 28 years old. Performed at [facility], [address], [place], [place] [address]   Cath Tip Culture     Status: Abnormal   Collection Time: 10/01/24 11:03 AM   Specimen: Catheter Tip; Other  Result Value Ref Range Status   Specimen Description   Final    CATH TIP Performed at [facility], [address] [place], [place] [address]    Special Requests   Final    NONE Performed at [facility], [address] [place], [place] [address]    Culture METHICILLIN RESISTANT STAPHYLOCOCCUS AUREUS (A)  Final   Report Status 10/03/2024 FINAL  Final   Organism ID, Bacteria METHICILLIN RESISTANT STAPHYLOCOCCUS  AUREUS  Final      Susceptibility   Methicillin resistant staphylococcus aureus - MIC*    CIPROFLOXACIN  >=8 RESISTANT Resistant     ERYTHROMYCIN  >=8 RESISTANT Resistant     GENTAMICIN <=0.5 SENSITIVE Sensitive     OXACILLIN >=4 RESISTANT Resistant     TETRACYCLINE <=1 SENSITIVE Sensitive     VANCOMYCIN  <=0.5 SENSITIVE Sensitive     TRIMETH /SULFA  <=10 SENSITIVE Sensitive     CLINDAMYCIN >=8 RESISTANT Resistant     RIFAMPIN  <=0.5 SENSITIVE Sensitive     Inducible Clindamycin NEGATIVE Sensitive     LINEZOLID  2 SENSITIVE Sensitive     * METHICILLIN RESISTANT STAPHYLOCOCCUS AUREUS  Culture, blood (Routine X 2) w Reflex to ID Panel     Status: None   Collection Time: 10/02/24  6:36 AM   Specimen: BLOOD  Result Value Ref Range Status   Specimen Description BLOOD BLOOD LEFT ARM  Final   Special Requests   Final    BOTTLES DRAWN AEROBIC AND ANAEROBIC Blood Culture adequate volume   Culture   Final    NO GROWTH 5 DAYS Performed at [facility], [address], [place], [place] [address]    Report Status 10/07/2024 FINAL  Final  Culture, blood (Routine X 2) w Reflex to ID Panel     Status: None   Collection Time: 10/02/24  6:36 AM   Specimen: BLOOD  Result Value Ref Range Status   Specimen Description BLOOD BLOOD LEFT ARM  Final   Special Requests   Final    BOTTLES DRAWN AEROBIC AND ANAEROBIC Blood Culture adequate volume   Culture   Final    NO GROWTH 5 DAYS Performed at [facility], [address] [place], [place] [address]    Report Status 10/07/2024 FINAL  Final    Coagulation Studies: No results for  input(s): LABPROT, INR in the last 72 hours.   Urinalysis: No results for input(s): COLORURINE, LABSPEC, PHURINE, GLUCOSEU, HGBUR, BILIRUBINUR, KETONESUR, PROTEINUR, UROBILINOGEN, NITRITE, LEUKOCYTESUR in the last 72 hours.  Invalid input(s): APPERANCEUR     Imaging: DG Abd 1 View Result Date: 10/09/2024 CLINICAL DATA:   Ileus EXAM: ABDOMEN - 1 VIEW COMPARISON:  Two days ago FINDINGS: No abnormal bowel dilatation is noted. Moderate amount of stool is seen in the right colon. No radio-opaque calculi or other significant radiographic abnormality are seen. IMPRESSION: Moderate stool burden. No abnormal bowel dilatation. Electronically Signed   By: Lynwood Landy Raddle M.D.   On: 10/09/2024 11:56   DG Abd 1 View Result Date: 10/07/2024 EXAM: 1 VIEW XRAY OF THE ABDOMEN 10/07/2024 07:17:00 PM COMPARISON: XR Abdomen 09/29/2024, CT Abdomen/Pelvis 09/30/2024. CLINICAL HISTORY: SBO (small bowel obstruction) (HCC). FINDINGS: BOWEL: Diffusely gas-filled, although not overtly distended, small bowel and colon throughout abdomen and pelvis. nonobstructive bowel gas pattern. SOFT TISSUES: No abnormal calcifications. BONES: Postoperative changes in lower thoracic spine. No acute fracture. LUNGS: Bilateral lung base opacity. IMPRESSION: 1. Nonobstructive bowel gas pattern. 2. Bilateral lung base opacity. Electronically signed by: Morgane Naveau MD 10/07/2024 07:24 PM EST RP Workstation: [id number]     Medications:    anticoagulant sodium citrate      vancomycin  1,250 mg (10/07/24 2111)    bisacodyl   10 mg Oral QHS   calcitRIOL   0.5 mcg Oral Daily   carvedilol   6.25 mg Oral BID WC   Chlorhexidine  Gluconate Cloth  6 each Topical Q0600   cyanocobalamin   1,000 mcg Oral Daily   diltiazem   240 mg Oral QPM   doxepin   10 mg Oral QHS   famotidine   10 mg Oral Daily   folic acid   1 mg Oral Daily   gabapentin   300 mg Oral QHS   heparin   5,000 Units Subcutaneous Q8H   insulin  aspart  0-5 Units Subcutaneous QHS   insulin  aspart  0-6 Units Subcutaneous TID WC   insulin  aspart  9 Units Subcutaneous TID WC   insulin  glargine  22 Units Subcutaneous BID   levothyroxine   175 mcg Oral QHS   linaclotide   145 mcg Oral QAC breakfast   loratadine   10 mg Oral Daily   mupirocin  ointment  1 Application Nasal BID   nicotine   14 mg Transdermal Daily    pantoprazole   40 mg Oral Daily   polyethylene glycol  17 g Oral BID   senna-docusate  1 tablet Oral BID   simethicone   80 mg Oral QID   acetaminophen  **OR** acetaminophen , albuterol , anticoagulant sodium citrate , bisacodyl , guaiFENesin , liver oil-zinc  oxide, nicotine  polacrilex, ondansetron  **OR** ondansetron  (ZOFRAN ) IV, oxyCODONE , sodium phosphate   Assessment/ Plan:  Mr. Mattie Novosel is a 64 y.o.  male with past medical conditions including diabetes, hepatitis C, bilateral BKA, anxiety and depression, and end-stage renal disease on hemodialysis.  Patient presents to the emergency department from his facility complaining of shortness of breath.  Patient is currently admitted for SIRS (systemic inflammatory response syndrome) (HCC) [R65.10]  [facility] /MWF/120.5 kg  End-stage renal disease on hemodialysis.        Due to infection, vascular removed right IJ PermCath on 1/8.  Received line free holiday. Vascular has placed lt internal jugular permcath.   Dialysis received yesterday, UF 2.4L with the assistance of IV albumnin. Next treatment scheduled for Friday  Anemia of chronic kidney disease Lab Results  Component Value Date   HGB 8.5 (L) 10/08/2024   Hemoglobin 8.5,  will order low dose Retacrit  with next dialysis.   3.  Systemic inflammatory response syndrome, suspected due to fever, leukocytosis, and tachypnea on ED arrival.  Suspected source of suprapubic catheter. Abdominal x-ray shows moderate diffuse air distention of the bowel suspicious for ileus.  Blood culture positive for MRSA. Vascular removed PermCath on 1/8 and replaced on 1/12.  Continues to receive vancomycin , ordered per primary team.  ID consulted, recommends Vancomycin  1250mg  with dialysis until 11/11/24. Outpatient clinic notified  4. Secondary Hyperparathyroidism: with outpatient labs: None available  Lab Results  Component Value Date   PTH 177 (H) 04/23/2024   CALCIUM  7.9 (L) 10/08/2024   PHOS 4.2  10/08/2024   Bone minerals acceptable during this admission   LOS: 10 Preslee Regas 1/16/202612:51 PM   "

## 2024-10-09 NOTE — Plan of Care (Signed)

## 2024-10-09 NOTE — TOC Progression Note (Addendum)
 Transition of Care [facility]) - Progression Note    Patient Details  Name: Eugene [name] MRN: [id number] Date of Birth: 1961/08/27  [facility] CM/SW Contact  Dalia GORMAN Fuse, RN Phone Number: 10/09/2024, 10:31 AM  Clinical Narrative:    Per the KUB the patient may have an illeus. The patient refused NGT, plan to start clear liquid diet, the patient may consider NGT if starts to vomit. Plan to dc back to LTC at [facility] when medically ready.    TOC spoke with Massie at [facility]. The facility can admit the patient back over the weekend.                   Expected Discharge Plan and Services                                               Social Drivers of Health (SDOH) Interventions SDOH Screenings   Food Insecurity: No Food Insecurity (09/29/2024)  Housing: Low Risk (09/29/2024)  Transportation Needs: No Transportation Needs (09/29/2024)  Utilities: Not At Risk (09/29/2024)  Social Connections: Socially Isolated (09/29/2024)  Tobacco Use: Medium Risk (09/29/2024)    Readmission Risk Interventions    05/05/2024    3:05 PM  Readmission Risk Prevention Plan  Transportation Screening Complete  Medication Review (RN Care Manager) Complete  PCP or Specialist appointment within 3-5 days of discharge Complete  SW Recovery Care/Counseling Consult Complete  Palliative Care Screening Not Applicable  Skilled Nursing Facility Complete

## 2024-10-09 NOTE — Progress Notes (Signed)
 [facility] Progress Note  Patient: Eugene [name]    FMW:[id number]  DOA: 09/29/2024     Date of Service: the patient was seen and examined on 10/09/2024  Chief Complaint  Patient presents with   Shortness of Breath   Brief hospital course:  Eugene [name] is a 64 y.o. male with medical history significant of anxiety, depression, type 2 diabetes mellitus, history of hepatitis C, end-stage renal disease on dialysis, bilateral BKA.  Patient known to most historian.  States he is short of breath.  He is sick to his stomach.  He vomited.  No diarrhea.  He is short of breath.  He is having a fever.  In the ER he was found to have an elevated white blood cell count and started on sepsis protocol.  Respiratory panel pending, chest x-ray shows mild interstitial edema and left greater than right pleural effusion.  Urine looks positive and patient has a suprapubic catheter.  Hospitalist services contacted for further evaluation.      Assessment and Plan:  # Sepsis secondary to MRSA bacteremia Present on admission.  Sepsis criteria fever leukocytosis and tachycardia.  Now resolved.  MRSA positive in 4 out of 4 bottles. HD catheter removed 1/8 Afebrile overnight Repeat blood cultures no growth to date Continue vancomycin  -- Continue to follow blood cultures --CT of the chest given patient intermittently requiring 10 L of oxygen up from baseline of 6 L 1/13 CT Chest negative for PE, bilateral pleural effusion, mild to moderate.  Atelectasis -- Will likely need 6 weeks of antibiotics given likely cannot undergo TEE 1/14 cardiology consulted to eval for TEE.  Cardiology recommended no TEE, risk outweigh the benefit.  Advised to continue antibiotics for longer duration.  ID is aware. As per ID, continue vancomycin  1250mg  IV with HD on MWF End date: 11/11/2024   # Ileus Small and large bowel ileus 1/14 KUB: Diffusely gas-filled, although not overtly distended, small bowel and colon throughout  abdomen and pelvis. nonobstructive bowel gas pattern. 1/7 CT a/p: notable for proctocolitis but no findings to suggest etiology of his nausea.   --Continue antiemetics, aggressive bowel regimen --Continue zofran  prn 1/15 as per KUB patient may have ileus, discussed with patient regarding NG tube insertion and suction, he refused.  Advised to start liquid diet and avoid solids, remains at high risk for aspiration but he did not wanted liquid diet, wanted to continue regular diet.  He will consider NG tube suctioning if he gets sick or starts vomiting. Continue aspiration precautions. 1/16 KUB shows moderate stool burden, no abnormal bowel dilatation. Mineral oil enema order placed  # Normocytic anemia # Thrombocytopenia improving Likely in settong of anemia of chronic disease and infection. H/o B12 deficiency Continue B12 supplementation, folate borderline, continue supplementation    # ESRD (end stage renal disease) (HCC) Hemodialysis per nephrology   # Paroxysmal SVT (supraventricular tachycardia) On Cardizem  and Coreg    # Heart failure with preserved ejection fraction (HCC) Dialysis to manage fluid   # Acute on chronic respiratory failure with hypoxia (HCC) resolved Patient oxygen level intermittently increased for comfort, he maintained appropriate oxygen saturations on 6 L.  An ABG was obtained after placing patient on 6 L of oxygen for 3 hours and his O2 saturation was 96%.  Patient currently refusing CPAP machine. Incentive spirometry Maintain O2 saturations greater than 92%   # Asymptomatic bacteruria  Ucx with proteus mirabilis, and MRSA. MRSA due to MRSA bacteremia likely in setting of line infection.  Patient  is on IV vancomycin . Follow ID for duration of treatment   # HTN Blood pressure remains slightly elevated. Discontinued midodrine  Continue Coreg  and Cardizem  Monitor BP and titrate medications accordingly  # Chronic bilateral pleural effusions Small.  Left  greater than right.  Continue HD.    # Chronic pain On oxycodone    # Uncontrolled type 2 diabetes mellitus with hyperglycemia, with long-term current use of insulin  (HCC) A1c 7.3 though ESRD.  Remains poorly controlled will increase his Lantus  to 22 units twice daily and his mealtime insulin  to 9 units 3 times daily with meals.     # Chronic constipation Patient on numerous medications for constipation.  Had a bowel movement this a.m. --Continue with aggressive bowel regimen      # Hypothyroidism: On levothyroxine    # Vitamin D  deficiency: Started Calcitrol 0.5 mcg p.o. daily. follow with PCP to repeat vitamin D  level in between 3 to 6 months.  Obesity class II Body mass index is 39.7 kg/m.  Interventions: Calorie restricted diet advised to lose body weight.    Wound 09/29/24 1832 Pressure Injury Buttocks Right;Left Stage 2 -  Partial thickness loss of dermis presenting as a shallow open injury with a red, pink wound bed without slough. (Active)     Diet: Carb modified diet DVT Prophylaxis: Subcutaneous Heparin     Advance goals of care discussion: Full code  Family Communication: family was not present at bedside, at the time of interview.  The pt provided permission to discuss medical plan with the family. Opportunity was given to ask question and all questions were answered satisfactorily.   Disposition:  Pt is from SNF LTC, admitted with MRSA bacteremia, on IV antibiotics. Patient has severe constipation, enema order placed on 1/16  Discharge back to LTC, when stable, most likely tomorrow a.m. if remains stable.  Follow TOC for dispo plan  Subjective: No significant events overnight.  Patient still complaining of abdominal pain, stated that he had a small BM yesterday, not sure for the passing gas.  Denied any nausea vomiting.    Physical Exam: General: NAD, lying comfortably Appear in no distress, affect appropriate Eyes: PERRLA ENT: Oral Mucosa Clear, moist   Neck: no JVD,  Cardiovascular: S1 and S2 Present, no Murmur,  Respiratory: good air entry bilaterally, decreased bibasilar breath sounds and bibasilar crackles, no significant wheezes Abdomen: BS present, Soft, obese, distended, mild generalized tenderness Skin: no rashes Extremities: no Pedal edema, no calf tenderness Neurologic: without any new focal findings Gait not checked due to patient safety concerns  Vitals:   10/09/24 1400 10/09/24 1420 10/09/24 1422 10/09/24 1509  BP: (!) 97/57 105/60 112/61 115/61  Pulse: 65 67 67 68  Resp: 16 17 17 14   Temp:   98.6 F (37 C)   TempSrc:      SpO2: (!) 88% (!) 84% 98% 93%  Weight:      Height:        Intake/Output Summary (Last 24 hours) at 10/09/2024 1532 Last data filed at 10/09/2024 1422 Gross per 24 hour  Intake 360 ml  Output 500 ml  Net -140 ml   Filed Weights   10/07/24 1316 10/07/24 1745 10/09/24 1135  Weight: 126.1 kg 123.7 kg 125.5 kg    Data Reviewed: I have personally reviewed and interpreted daily labs, tele strips, imagings as discussed above. I reviewed all nursing notes, pharmacy notes, vitals, pertinent old records I have discussed plan of care as described above with RN and patient/family.  CBC:  Recent Labs  Lab 10/03/24 0628 10/04/24 0802 10/06/24 0524 10/07/24 0449 10/08/24 0527  WBC 8.5 7.9 6.9 7.0 6.1  NEUTROABS  --   --  4.6 4.1  --   HGB 10.0* 9.8* 9.8* 8.7* 8.5*  HCT 31.4* 30.9* 31.2* 27.6* 27.2*  MCV 94.0 94.5 94.0 94.2 93.8  PLT 85* 102* 129* 143* 149*   Basic Metabolic Panel: Recent Labs  Lab 10/04/24 0802 10/05/24 1410 10/06/24 0524 10/07/24 1430 10/08/24 0527  NA 137 137 136 132* 132*  K 4.0 4.2 4.4 4.7 4.3  CL 105 103 102 99 96*  CO2 22 24 25  21* 25  GLUCOSE 277* 255* 229* 189* 208*  BUN 75* 57* 51* 71* 43*  CREATININE 5.39* 3.89* 4.33* 6.45* 4.78*  CALCIUM  7.6* 8.0* 8.1* 7.7* 7.9*  MG 1.9  --   --   --  1.9  PHOS  --   --  4.2 5.6* 4.2    Studies: DG Abd 1  View Result Date: 10/09/2024 CLINICAL DATA:  Ileus EXAM: ABDOMEN - 1 VIEW COMPARISON:  Two days ago FINDINGS: No abnormal bowel dilatation is noted. Moderate amount of stool is seen in the right colon. No radio-opaque calculi or other significant radiographic abnormality are seen. IMPRESSION: Moderate stool burden. No abnormal bowel dilatation. Electronically Signed   By: Lynwood Landy Raddle M.D.   On: 10/09/2024 11:56    Scheduled Meds:  bisacodyl   10 mg Oral QHS   calcitRIOL   0.5 mcg Oral Daily   carvedilol   6.25 mg Oral BID WC   Chlorhexidine  Gluconate Cloth  6 each Topical Q0600   cyanocobalamin   1,000 mcg Oral Daily   diltiazem   240 mg Oral QPM   doxepin   10 mg Oral QHS   famotidine   10 mg Oral Daily   folic acid   1 mg Oral Daily   gabapentin   300 mg Oral QHS   heparin   5,000 Units Subcutaneous Q8H   insulin  aspart  0-5 Units Subcutaneous QHS   insulin  aspart  0-6 Units Subcutaneous TID WC   insulin  aspart  9 Units Subcutaneous TID WC   insulin  glargine  22 Units Subcutaneous BID   levothyroxine   175 mcg Oral QHS   linaclotide   145 mcg Oral QAC breakfast   loratadine   10 mg Oral Daily   mineral oil  1 enema Rectal Once   mupirocin  ointment  1 Application Nasal BID   nicotine   14 mg Transdermal Daily   pantoprazole   40 mg Oral Daily   polyethylene glycol  17 g Oral BID   senna-docusate  1 tablet Oral BID   simethicone   80 mg Oral QID   Continuous Infusions:  anticoagulant sodium citrate      vancomycin  1,250 mg (10/07/24 2111)   PRN Meds: acetaminophen  **OR** acetaminophen , albuterol , anticoagulant sodium citrate , bisacodyl , guaiFENesin , liver oil-zinc  oxide, nicotine  polacrilex, ondansetron  **OR** ondansetron  (ZOFRAN ) IV, oxyCODONE , sodium phosphate   Time spent: 40 minutes  Author: ELVAN SOR. MD [facility] 10/09/2024 3:32 PM  To reach On-call, see care teams to locate the attending and reach out to them via [email or web address]. If 7PM-7AM, please contact  night-coverage If you still have difficulty reaching the attending provider, please page the [facility] (Director on Call) for [facility] on amion for assistance.

## 2024-10-09 NOTE — Progress Notes (Signed)
" °   10/09/24 1422  Vitals  Temp 98.6 F (37 C)  BP 112/61  MAP (mmHg) 76  Pulse Rate 67  ECG Heart Rate 67  Resp 17  Oxygen Therapy  SpO2 98 %  O2 Device Nasal Cannula  Patient Activity (if Appropriate) In bed  Pulse Oximetry Type Continuous  Oximetry Probe Site Changed No  During Treatment Monitoring  Blood Flow Rate (mL/min) 0 mL/min  Arterial Pressure (mmHg) -47.27 mmHg  Venous Pressure (mmHg) 106.26 mmHg  TMP (mmHg) 2.02 mmHg  Ultrafiltration Rate (mL/min) 0 mL/min  Dialysate Flow Rate (mL/min) 299 ml/min  Duration of HD Treatment -hour(s) 2.07 hour(s)  Cumulative Fluid Removed (mL) per Treatment  497.62  Post Treatment  Dialyzer Clearance Lightly streaked  Hemodialysis Intake (mL) 0 mL  Liters Processed 48.5  Fluid Removed (mL) 500 mL  Tolerated HD Treatment No (Comment)  Post-Hemodialysis Comments Patient exhibited asymptomatic hypotension during HD. Hypotension resolved with reduced goal.  Patient also complained of nausea for which he received Zofran . Patient repeated request to have treatment terminated. Patient received education and verbalized understanding of need of dialysis. Patient adamant to terminate his scheduled treatment. Patient received 2 hours of treatmetn.Vs are now at baseline.  AVG/AVF Arterial Site Held (minutes) 0 minutes  AVG/AVF Venous Site Held (minutes) 0 minutes    "

## 2024-10-09 NOTE — Progress Notes (Signed)
 PHARMACY CONSULT NOTE FOR:  OUTPATIENT  PARENTERAL ANTIBIOTIC THERAPY (OPAT)  Indication: MRSA bacteremia Regimen: vancomycin  1250mg  IV with HD on MWF End date: 11/11/2024  Labs - Monday:  CBC/D, CMP, and pre-HD vancomycin  level Fax weekly lab results  promptly to [phone] Call [phone] with questions or critical value To be given at HD center (nephrology team at [facility] aware)  IV antibiotic discharge orders are pended. To discharging provider:  please sign these orders via discharge navigator,  Select New Orders & click on the button choice - Manage This Unsigned Work.     Thank you for allowing pharmacy to be a part of this patient's care.  Ambers Iyengar, PharmD, BCPS, BCIDP Work Cell: [phone] 10/09/2024 2:25 PM

## 2024-10-09 NOTE — Progress Notes (Signed)
 OT Cancellation Note  Patient Details Name: Khasir Woodrome MRN: [id number] DOB: Jan 15, 1961   Cancelled Treatment:    Reason Eval/Treat Not Completed: Patient at procedure or test/ unavailable. Pt at dialysis. Will re-attempt OT tx at later date/time.   Johanny Segers R., MPH, MS, OTR/L ascom [phone] 10/09/24, 12:05 PM

## 2024-10-09 NOTE — Plan of Care (Signed)

## 2024-10-09 NOTE — Progress Notes (Signed)
 PT Cancellation Note  Patient Details Name: Eugene [name] MRN: [id number] DOB: 1960-12-19   Cancelled Treatment:    Reason Eval/Treat Not Completed: Other (comment). Pt was out of room, PT to re-attempt as able.    Doyal Shams PT, DPT 3:53 PM,10/09/24

## 2024-10-10 DIAGNOSIS — R6510 Systemic inflammatory response syndrome (SIRS) of non-infectious origin without acute organ dysfunction: Secondary | ICD-10-CM | POA: Diagnosis not present

## 2024-10-10 LAB — MAGNESIUM: Magnesium: 1.9 mg/dL (ref 1.7–2.4)

## 2024-10-10 LAB — BASIC METABOLIC PANEL WITH GFR
Anion gap: 11 (ref 5–15)
BUN: 51 mg/dL — ABNORMAL HIGH (ref 8–23)
CO2: 24 mmol/L (ref 22–32)
Calcium: 8.4 mg/dL — ABNORMAL LOW (ref 8.9–10.3)
Chloride: 94 mmol/L — ABNORMAL LOW (ref 98–111)
Creatinine, Ser: 5.54 mg/dL — ABNORMAL HIGH (ref 0.61–1.24)
GFR, Estimated: 11 mL/min — ABNORMAL LOW
Glucose, Bld: 127 mg/dL — ABNORMAL HIGH (ref 70–99)
Potassium: 4.7 mmol/L (ref 3.5–5.1)
Sodium: 130 mmol/L — ABNORMAL LOW (ref 135–145)

## 2024-10-10 LAB — CBC
HCT: 26.4 % — ABNORMAL LOW (ref 39.0–52.0)
Hemoglobin: 8.6 g/dL — ABNORMAL LOW (ref 13.0–17.0)
MCH: 30.5 pg (ref 26.0–34.0)
MCHC: 32.6 g/dL (ref 30.0–36.0)
MCV: 93.6 fL (ref 80.0–100.0)
Platelets: 200 K/uL (ref 150–400)
RBC: 2.82 MIL/uL — ABNORMAL LOW (ref 4.22–5.81)
RDW: 13.8 % (ref 11.5–15.5)
WBC: 6 K/uL (ref 4.0–10.5)
nRBC: 0 % (ref 0.0–0.2)

## 2024-10-10 LAB — GLUCOSE, CAPILLARY
Glucose-Capillary: 125 mg/dL — ABNORMAL HIGH (ref 70–99)
Glucose-Capillary: 151 mg/dL — ABNORMAL HIGH (ref 70–99)
Glucose-Capillary: 142 mg/dL — ABNORMAL HIGH (ref 70–99)
Glucose-Capillary: 144 mg/dL — ABNORMAL HIGH (ref 70–99)

## 2024-10-10 LAB — PHOSPHORUS: Phosphorus: 5.3 mg/dL — ABNORMAL HIGH (ref 2.5–4.6)

## 2024-10-10 NOTE — Progress Notes (Signed)
 [facility] Progress Note  Patient: Eugene [name]    FMW:[id number]  DOA: 09/29/2024     Date of Service: the patient was seen and examined on 10/10/2024  Chief Complaint  Patient presents with   Shortness of Breath   Brief hospital course:  Isaiyah Feldhaus is a 64 y.o. male with medical history significant of anxiety, depression, type 2 diabetes mellitus, history of hepatitis C, end-stage renal disease on dialysis, bilateral BKA.  Patient known to most historian.  States he is short of breath.  He is sick to his stomach.  He vomited.  No diarrhea.  He is short of breath.  He is having a fever.  In the ER he was found to have an elevated white blood cell count and started on sepsis protocol.  Respiratory panel pending, chest x-ray shows mild interstitial edema and left greater than right pleural effusion.  Urine looks positive and patient has a suprapubic catheter.  Hospitalist services contacted for further evaluation.      Assessment and Plan:  # Sepsis secondary to MRSA bacteremia Present on admission.  Sepsis criteria fever leukocytosis and tachycardia.  Now resolved.  MRSA positive in 4 out of 4 bottles. HD catheter removed 1/8 Afebrile overnight Repeat blood cultures no growth to date Continue vancomycin  -- Continue to follow blood cultures --CT of the chest given patient intermittently requiring 10 L of oxygen up from baseline of 6 L 1/13 CT Chest negative for PE, bilateral pleural effusion, mild to moderate.  Atelectasis -- Will likely need 6 weeks of antibiotics given likely cannot undergo TEE 1/14 cardiology consulted to eval for TEE.  Cardiology recommended no TEE, risk outweigh the benefit.  Advised to continue antibiotics for longer duration.  ID is aware. As per ID, continue vancomycin  1250mg  IV with HD on MWF End date: 11/11/2024   # Ileus Small and large bowel ileus 1/14 KUB: Diffusely gas-filled, although not overtly distended, small bowel and colon throughout  abdomen and pelvis. nonobstructive bowel gas pattern. 1/7 CT a/p: notable for proctocolitis but no findings to suggest etiology of his nausea.   --Continue antiemetics, aggressive bowel regimen --Continue zofran  prn 1/15 as per KUB patient may have ileus, discussed with patient regarding NG tube insertion and suction, he refused.  Advised to start liquid diet and avoid solids, remains at high risk for aspiration but he did not wanted liquid diet, wanted to continue regular diet.  He will consider NG tube suctioning if he gets sick or starts vomiting. Continue aspiration precautions. 1/16 KUB shows moderate stool burden, no abnormal bowel dilatation. Mineral oil enema order placed 1/17 patient was refusing MiraLAX , advised to take it regularly and patient agreed for Dulcolax suppository.  Followed by smog enema if no BM after suppository.   # Normocytic anemia # Thrombocytopenia improving Likely in settong of anemia of chronic disease and infection. H/o B12 deficiency Continue B12 supplementation, folate borderline, continue supplementation    # ESRD (end stage renal disease) (HCC) Hemodialysis per nephrology   # Paroxysmal SVT (supraventricular tachycardia) On Cardizem  and Coreg    # Heart failure with preserved ejection fraction (HCC) Dialysis to manage fluid   # Acute on chronic respiratory failure with hypoxia (HCC) resolved Patient oxygen level intermittently increased for comfort, he maintained appropriate oxygen saturations on 6 L.  An ABG was obtained after placing patient on 6 L of oxygen for 3 hours and his O2 saturation was 96%.  Patient currently refusing CPAP machine. Incentive spirometry Maintain O2 saturations greater  than 92%   # Asymptomatic bacteruria  Ucx with proteus mirabilis, and MRSA. MRSA due to MRSA bacteremia likely in setting of line infection.  Patient is on IV vancomycin . Follow ID for duration of treatment   # HTN Blood pressure remains slightly  elevated. Discontinued midodrine  Continue Coreg  and Cardizem  Monitor BP and titrate medications accordingly  # Chronic bilateral pleural effusions Small.  Left greater than right.  Continue HD.    # Chronic pain On oxycodone    # Uncontrolled type 2 diabetes mellitus with hyperglycemia, with long-term current use of insulin  (HCC) A1c 7.3 though ESRD.  Remains poorly controlled will increase his Lantus  to 22 units twice daily and his mealtime insulin  to 9 units 3 times daily with meals.     # Chronic constipation Patient on numerous medications for constipation.  Had a bowel movement this a.m. --Continue with aggressive bowel regimen      # Hypothyroidism: On levothyroxine    # Vitamin D  deficiency: Started Calcitrol 0.5 mcg p.o. daily. follow with PCP to repeat vitamin D  level in between 3 to 6 months.  Obesity class II Body mass index is 39.7 kg/m.  Interventions: Calorie restricted diet advised to lose body weight.    Wound 09/29/24 1832 Pressure Injury Buttocks Right;Left Stage 2 -  Partial thickness loss of dermis presenting as a shallow open injury with a red, pink wound bed without slough. (Active)     Diet: Carb modified diet DVT Prophylaxis: Subcutaneous Heparin     Advance goals of care discussion: Full code  Family Communication: family was not present at bedside, at the time of interview.  The pt provided permission to discuss medical plan with the family. Opportunity was given to ask question and all questions were answered satisfactorily.   Disposition:  Pt is from SNF LTC, admitted with MRSA bacteremia, on IV antibiotics. Patient has severe constipation, enema order placed on 1/16  Discharge back to LTC, when stable, most likely tomorrow a.m. if remains stable.  Follow TOC for dispo plan  Subjective: No significant events overnight.  Patient has some abdominal discomfort, has not moved bowels yet.  Denies any other complaints     Physical Exam: General:  NAD, lying comfortably Appear in no distress, affect appropriate Eyes: PERRLA ENT: Oral Mucosa Clear, moist  Neck: no JVD,  Cardiovascular: S1 and S2 Present, no Murmur,  Respiratory: good air entry bilaterally, decreased bibasilar breath sounds and bibasilar crackles, no significant wheezes Abdomen: BS present, Soft, obese, distended, mild generalized tenderness Skin: no rashes Extremities: no Pedal edema, no calf tenderness Neurologic: without any new focal findings Gait not checked due to patient safety concerns  Vitals:   10/09/24 1719 10/09/24 1940 10/10/24 0356 10/10/24 0819  BP: (!) 115/57 119/62 (!) 140/61 (!) 144/68  Pulse: 72 73 77 80  Resp: 18   20  Temp: 98.2 F (36.8 C) 98.5 F (36.9 C) 98.9 F (37.2 C) 98.6 F (37 C)  TempSrc:  Oral Oral   SpO2: 93% 93% 90% (!) 85%  Weight:      Height:        Intake/Output Summary (Last 24 hours) at 10/10/2024 1417 Last data filed at 10/10/2024 1300 Gross per 24 hour  Intake 480 ml  Output 500 ml  Net -20 ml   Filed Weights   10/07/24 1316 10/07/24 1745 10/09/24 1135  Weight: 126.1 kg 123.7 kg 125.5 kg    Data Reviewed: I have personally reviewed and interpreted daily labs, tele strips, imagings as  discussed above. I reviewed all nursing notes, pharmacy notes, vitals, pertinent old records I have discussed plan of care as described above with RN and patient/family.  CBC: Recent Labs  Lab 10/06/24 0524 10/07/24 0449 10/08/24 0527 10/09/24 1606 10/10/24 0650  WBC 6.9 7.0 6.1 7.2 6.0  NEUTROABS 4.6 4.1  --   --   --   HGB 9.8* 8.7* 8.5* 8.3* 8.6*  HCT 31.2* 27.6* 27.2* 26.1* 26.4*  MCV 94.0 94.2 93.8 93.2 93.6  PLT 129* 143* 149* 198 200   Basic Metabolic Panel: Recent Labs  Lab 10/04/24 0802 10/05/24 1410 10/06/24 0524 10/07/24 1430 10/08/24 0527 10/09/24 1606 10/10/24 0650  NA 137   < > 136 132* 132* 131* 130*  K 4.0   < > 4.4 4.7 4.3 4.3 4.7  CL 105   < > 102 99 96* 95* 94*  CO2 22   < > 25 21* 25  25 24   GLUCOSE 277*   < > 229* 189* 208* 109* 127*  BUN 75*   < > 51* 71* 43* 42* 51*  CREATININE 5.39*   < > 4.33* 6.45* 4.78* 4.83* 5.54*  CALCIUM  7.6*   < > 8.1* 7.7* 7.9* 8.2* 8.4*  MG 1.9  --   --   --  1.9 2.1 1.9  PHOS  --   --  4.2 5.6* 4.2 4.1 5.3*   < > = values in this interval not displayed.    Studies: No results found.   Scheduled Meds:  bisacodyl   10 mg Oral QHS   calcitRIOL   0.5 mcg Oral Daily   carvedilol   6.25 mg Oral BID WC   Chlorhexidine  Gluconate Cloth  6 each Topical Q0600   cyanocobalamin   1,000 mcg Oral Daily   diltiazem   240 mg Oral QPM   doxepin   10 mg Oral QHS   famotidine   10 mg Oral Daily   folic acid   1 mg Oral Daily   gabapentin   300 mg Oral QHS   heparin   5,000 Units Subcutaneous Q8H   insulin  aspart  0-5 Units Subcutaneous QHS   insulin  aspart  0-6 Units Subcutaneous TID WC   insulin  aspart  9 Units Subcutaneous TID WC   insulin  glargine  22 Units Subcutaneous BID   levothyroxine   175 mcg Oral QHS   linaclotide   145 mcg Oral QAC breakfast   loratadine   10 mg Oral Daily   mupirocin  ointment  1 Application Nasal BID   nicotine   14 mg Transdermal Daily   pantoprazole   40 mg Oral Daily   polyethylene glycol  17 g Oral BID   senna-docusate  1 tablet Oral BID   simethicone   80 mg Oral QID   Continuous Infusions:  anticoagulant sodium citrate      vancomycin  1,250 mg (10/09/24 1802)   PRN Meds: acetaminophen  **OR** acetaminophen , albuterol , anticoagulant sodium citrate , bisacodyl , guaiFENesin , liver oil-zinc  oxide, nicotine  polacrilex, ondansetron  **OR** ondansetron  (ZOFRAN ) IV, oxyCODONE   Time spent: 40 minutes  Author: ELVAN SOR. MD [facility] 10/10/2024 2:17 PM  To reach On-call, see care teams to locate the attending and reach out to them via [email or web address]. If 7PM-7AM, please contact night-coverage If you still have difficulty reaching the attending provider, please page the [facility] (Director on Call) for [facility] on  amion for assistance.

## 2024-10-10 NOTE — Progress Notes (Signed)
 " [facility]  PROGRESS NOTE   Subjective:   Seen at bedside.  Denies any chest pain or shortness of breath.  Was dialyzed yesterday.  Objective:  Vital signs: Blood pressure (!) 153/77, pulse 80, temperature 98.1 F (36.7 C), temperature source Oral, resp. rate 20, height 5' 10 (1.778 m), weight 125.5 kg, SpO2 95%.  Intake/Output Summary (Last 24 hours) at 10/10/2024 2117 Last data filed at 10/10/2024 1900 Gross per 24 hour  Intake 480 ml  Output --  Net 480 ml   Filed Weights   10/07/24 1316 10/07/24 1745 10/09/24 1135  Weight: 126.1 kg 123.7 kg 125.5 kg     Physical Exam: General:  No acute distress  Head:  Normocephalic, atraumatic. Moist oral mucosal membranes  Eyes:  Anicteric  Neck:  Supple  Lungs:   Clear to auscultation, normal effort  Heart:  S1S2 no rubs  Abdomen:   Soft, nontender, bowel sounds present  Extremities:  peripheral edema.  Neurologic:  Awake, alert, following commands  Skin:  No lesions  Access:     Basic Metabolic Panel: Recent Labs  Lab 10/04/24 0802 10/05/24 1410 10/06/24 0524 10/07/24 1430 10/08/24 0527 10/09/24 1606 10/10/24 0650  NA 137   < > 136 132* 132* 131* 130*  K 4.0   < > 4.4 4.7 4.3 4.3 4.7  CL 105   < > 102 99 96* 95* 94*  CO2 22   < > 25 21* 25 25 24   GLUCOSE 277*   < > 229* 189* 208* 109* 127*  BUN 75*   < > 51* 71* 43* 42* 51*  CREATININE 5.39*   < > 4.33* 6.45* 4.78* 4.83* 5.54*  CALCIUM  7.6*   < > 8.1* 7.7* 7.9* 8.2* 8.4*  MG 1.9  --   --   --  1.9 2.1 1.9  PHOS  --   --  4.2 5.6* 4.2 4.1 5.3*   < > = values in this interval not displayed.   GFR: Estimated Creatinine Clearance: 18.6 mL/min (A) (by C-G formula based on SCr of 5.54 mg/dL (H)).  Liver Function Tests: Recent Labs  Lab 10/06/24 0524 10/07/24 1430  ALBUMIN  2.6* 2.5*   No results for input(s): LIPASE, AMYLASE in the last 168 hours. No results for input(s): AMMONIA in the last 168 hours.  CBC: Recent Labs  Lab  10/06/24 0524 10/07/24 0449 10/08/24 0527 10/09/24 1606 10/10/24 0650  WBC 6.9 7.0 6.1 7.2 6.0  NEUTROABS 4.6 4.1  --   --   --   HGB 9.8* 8.7* 8.5* 8.3* 8.6*  HCT 31.2* 27.6* 27.2* 26.1* 26.4*  MCV 94.0 94.2 93.8 93.2 93.6  PLT 129* 143* 149* 198 200     HbA1C: Hgb A1c MFr Bld  Date/Time Value Ref Range Status  06/24/2024 05:05 AM 7.3 (H) 4.8 - 5.6 % Final    Comment:    (NOTE) Diagnosis of Diabetes The following HbA1c ranges recommended by the American Diabetes Association (ADA) may be used as an aid in the diagnosis of diabetes mellitus.  Hemoglobin             Suggested A1C NGSP%              Diagnosis  <5.7                   Non Diabetic  5.7-6.4                Pre-Diabetic  >6.4  Diabetic  <7.0                   Glycemic control for                       adults with diabetes.    04/14/2024 06:43 AM 6.0 (H) 4.8 - 5.6 % Final    Comment:    (NOTE) Diagnosis of Diabetes The following HbA1c ranges recommended by the American Diabetes Association (ADA) may be used as an aid in the diagnosis of diabetes mellitus.  Hemoglobin             Suggested A1C NGSP%              Diagnosis  <5.7                   Non Diabetic  5.7-6.4                Pre-Diabetic  >6.4                   Diabetic  <7.0                   Glycemic control for                       adults with diabetes.      Urinalysis: No results for input(s): COLORURINE, LABSPEC, PHURINE, GLUCOSEU, HGBUR, BILIRUBINUR, KETONESUR, PROTEINUR, UROBILINOGEN, NITRITE, LEUKOCYTESUR in the last 72 hours.  Invalid input(s): APPERANCEUR    Imaging: DG Abd 1 View Result Date: 10/09/2024 CLINICAL DATA:  Ileus EXAM: ABDOMEN - 1 VIEW COMPARISON:  Two days ago FINDINGS: No abnormal bowel dilatation is noted. Moderate amount of stool is seen in the right colon. No radio-opaque calculi or other significant radiographic abnormality are seen. IMPRESSION: Moderate stool  burden. No abnormal bowel dilatation. Electronically Signed   By: Lynwood Landy Raddle M.D.   On: 10/09/2024 11:56     Medications:    anticoagulant sodium citrate      vancomycin  1,250 mg (10/09/24 1802)    bisacodyl   10 mg Oral QHS   calcitRIOL   0.5 mcg Oral Daily   carvedilol   6.25 mg Oral BID WC   Chlorhexidine  Gluconate Cloth  6 each Topical Q0600   cyanocobalamin   1,000 mcg Oral Daily   diltiazem   240 mg Oral QPM   doxepin   10 mg Oral QHS   famotidine   10 mg Oral Daily   folic acid   1 mg Oral Daily   gabapentin   300 mg Oral QHS   heparin   5,000 Units Subcutaneous Q8H   insulin  aspart  0-5 Units Subcutaneous QHS   insulin  aspart  0-6 Units Subcutaneous TID WC   insulin  aspart  9 Units Subcutaneous TID WC   insulin  glargine  22 Units Subcutaneous BID   levothyroxine   175 mcg Oral QHS   linaclotide   145 mcg Oral QAC breakfast   loratadine   10 mg Oral Daily   mupirocin  ointment  1 Application Nasal BID   nicotine   14 mg Transdermal Daily   pantoprazole   40 mg Oral Daily   polyethylene glycol  17 g Oral BID   senna-docusate  1 tablet Oral BID   simethicone   80 mg Oral QID    Assessment/ Plan:      64 y.o.  male with past medical conditions including diabetes, hepatitis C, bilateral BKA, anxiety and depression, and end-stage renal disease on  hemodialysis.  Patient presents to the emergency department from his facility complaining of shortness of breath.  Patient is currently admitted for SIRS (systemic inflammatory response syndrome) (HCC) [R65.10]   #1: ESRD: Status post internal jugular permacath.  Was dialyzed yesterday.  #2: Anemia: Continue anemia protocols with Retacrit .  #3: Hypertension: Continue carvedilol .  #4: Diabetes: Continue with insulin  protocols.  #5: Second hyperparathyroidism: Will monitor PTH, calcium  and phosphorus levels.  Labs and medications reviewed. Will continue to follow along with you.   LOS: 11 Pinkey Edman, MD [facility] 1/17/20269:17 PM  "

## 2024-10-11 ENCOUNTER — Inpatient Hospital Stay

## 2024-10-11 DIAGNOSIS — R6510 Systemic inflammatory response syndrome (SIRS) of non-infectious origin without acute organ dysfunction: Secondary | ICD-10-CM | POA: Diagnosis not present

## 2024-10-11 LAB — GLUCOSE, CAPILLARY
Glucose-Capillary: 141 mg/dL — ABNORMAL HIGH (ref 70–99)
Glucose-Capillary: 139 mg/dL — ABNORMAL HIGH (ref 70–99)
Glucose-Capillary: 113 mg/dL — ABNORMAL HIGH (ref 70–99)
Glucose-Capillary: 119 mg/dL — ABNORMAL HIGH (ref 70–99)

## 2024-10-11 NOTE — Progress Notes (Signed)
 Physical Therapy Treatment Patient Details Name: Eugene [name] MRN: [id number] DOB: 08-14-1961 Today's Date: 10/11/2024   History of Present Illness Pt is a 64 yo male that presented to the ED for fever, upset stomach. Workup for sepsis 2/2 MRSA. PMH of anxiety, depression, type 2 diabetes mellitus, history of hepatitis C, end-stage renal disease on dialysis, bilateral BKA, thoracic surgeries    PT Comments  Pt in bed, ready to participate. He is requiring 8 lpm HFNC today.  BLE HEP review.  Encouraged HEP on his own as able.  Stated he has not been doing any exercises on his own since admission.  He is able to use B rails to pull himself up into long sitting but needs to maintain B hands on rails and min a for tactile cues.  He sits for about 20 seconds each attempt before returning to supine.  Sats 82% on 8 lpm high flow but return to low 90's with rest.     If plan is discharge home, recommend the following: Assistance with cooking/housework;Two people to help with bathing/dressing/bathroom;Two people to help with walking and/or transfers;Direct supervision/assist for medications management;Help with stairs or ramp for entrance;Assist for transportation   Can travel by private vehicle        Equipment Recommendations       Recommendations for Other Services       Precautions / Restrictions Precautions Precautions: Fall Recall of Precautions/Restrictions: Impaired Precaution/Restrictions Comments: old B BKA's.  watch O2 Restrictions Weight Bearing Restrictions Per Provider Order: No     Mobility  Bed Mobility Overal bed mobility: Needs Assistance Bed Mobility: Supine to Sit Rolling: Used rails, Min assist         General bed mobility comments: able to sit upright in bed using B lower rails to pull up and to maintain sitting Patient Response: Cooperative  Transfers                   General transfer comment: deferred.  Hoyer mostly at baseline     Ambulation/Gait               General Gait Details: non amb at baseline   Optometrist     Tilt Bed Tilt Bed Patient Response: Cooperative  Modified Rankin (Stroke Patients Only)       Balance Overall balance assessment: Needs assistance Sitting-balance support: Bilateral upper extremity supported Sitting balance-Leahy Scale: Poor Sitting balance - Comments: heavy reliance on B raisl to maintain upright and poor control to return to supine                                    Communication Communication Communication: No apparent difficulties  Cognition Arousal: Alert Behavior During Therapy: Flat affect   PT - Cognitive impairments: No apparent impairments                         Following commands: Impaired Following commands impaired: Follows one step commands with increased time    Cueing Cueing Techniques: Verbal cues, Gestural cues, Tactile cues, Visual cues  Exercises Other Exercises Other Exercises: supine AROM BLE, sitting x 2 in bed with rail support    General Comments        Pertinent Vitals/Pain Pain Assessment Pain Assessment: Faces Faces Pain Scale: Hurts a little bit Pain  Location: bottom Pain Descriptors / Indicators: Sore Pain Intervention(s): Monitored during session, Limited activity within patient's tolerance, Repositioned    Home Living                          Prior Function            PT Goals (current goals can now be found in the care plan section) Progress towards PT goals: Progressing toward goals    Frequency    Min 1X/week      PT Plan      Co-evaluation              AM-PAC PT 6 Clicks Mobility   Outcome Measure  Help needed turning from your back to your side while in a flat bed without using bedrails?: A Lot Help needed moving from lying on your back to sitting on the side of a flat bed without using bedrails?: A Lot Help  needed moving to and from a bed to a chair (including a wheelchair)?: Total Help needed standing up from a chair using your arms (e.g., wheelchair or bedside chair)?: Total Help needed to walk in hospital room?: Total Help needed climbing 3-5 steps with a railing? : Total 6 Click Score: 8    End of Session   Activity Tolerance: Patient tolerated treatment well;Patient limited by fatigue Patient left: in bed;with call bell/phone within reach;with bed alarm set Nurse Communication: Mobility status PT Visit Diagnosis: Other abnormalities of gait and mobility (R26.89)     Time: 9084-9071 PT Time Calculation (min) (ACUTE ONLY): 13 min  Charges:    $Therapeutic Exercise: 8-22 mins PT General Charges $$ ACUTE PT VISIT: 1 Visit                   Lauraine Gills, PTA 10/11/24, 9:40 AM

## 2024-10-11 NOTE — Plan of Care (Signed)

## 2024-10-11 NOTE — Progress Notes (Signed)
 [facility] Progress Note  Patient: Eugene [name]    FMW:[id number]  DOA: 09/29/2024     Date of Service: the patient was seen and examined on 10/11/2024  Chief Complaint  Patient presents with   Shortness of Breath   Brief hospital course:  Eugene [name] is a 64 y.o. male with medical history significant of anxiety, depression, type 2 diabetes mellitus, history of hepatitis C, end-stage renal disease on dialysis, bilateral BKA.  Patient known to most historian.  States he is short of breath.  He is sick to his stomach.  He vomited.  No diarrhea.  He is short of breath.  He is having a fever.  In the ER he was found to have an elevated white blood cell count and started on sepsis protocol.  Respiratory panel pending, chest x-ray shows mild interstitial edema and left greater than right pleural effusion.  Urine looks positive and patient has a suprapubic catheter.  Hospitalist services contacted for further evaluation.      Assessment and Plan:  # Sepsis secondary to MRSA bacteremia Present on admission.  Sepsis criteria fever leukocytosis and tachycardia.  Now resolved.  MRSA positive in 4 out of 4 bottles. HD catheter removed 1/8 Afebrile overnight Repeat blood cultures no growth to date Continue vancomycin  -- Continue to follow blood cultures --CT of the chest given patient intermittently requiring 10 L of oxygen up from baseline of 6 L 1/13 CT Chest negative for PE, bilateral pleural effusion, mild to moderate.  Atelectasis -- Will likely need 6 weeks of antibiotics given likely cannot undergo TEE 1/14 cardiology consulted to eval for TEE.  Cardiology recommended no TEE, risk outweigh the benefit.  Advised to continue antibiotics for longer duration.  ID is aware. As per ID, continue vancomycin  1250mg  IV with HD on MWF End date: 11/11/2024   # Ileus Small and large bowel ileus 1/14 KUB: Diffusely gas-filled, although not overtly distended, small bowel and colon throughout  abdomen and pelvis. nonobstructive bowel gas pattern. 1/7 CT a/p: notable for proctocolitis but no findings to suggest etiology of his nausea.   --Continue antiemetics, aggressive bowel regimen --Continue zofran  prn 1/15 as per KUB patient may have ileus, discussed with patient regarding NG tube insertion and suction, he refused.  Advised to start liquid diet and avoid solids, remains at high risk for aspiration but he did not wanted liquid diet, wanted to continue regular diet.  He will consider NG tube suctioning if he gets sick or starts vomiting. Continue aspiration precautions. 1/16 KUB shows moderate stool burden, no abnormal bowel dilatation. Mineral oil enema order placed 1/17 patient was refusing MiraLAX , advised to take it regularly and patient agreed for Dulcolax suppository.  Followed by smog enema if no BM after suppository. 1/18 KUB shows moderate stool burden, soapsuds enema order placed   # Normocytic anemia # Thrombocytopenia improving Likely in settong of anemia of chronic disease and infection. H/o B12 deficiency Continue B12 supplementation, folate borderline, continue supplementation    # ESRD (end stage renal disease) (HCC) Hemodialysis per nephrology   # Paroxysmal SVT (supraventricular tachycardia) On Cardizem  and Coreg    # Heart failure with preserved ejection fraction (HCC) Dialysis to manage fluid  # Bilateral pleural effusion 1/18 IR consulted for thoracentesis, most likely will be done tomorrow 8   # Acute on chronic respiratory failure with hypoxia (HCC) resolved Patient oxygen level intermittently increased for comfort, he maintained appropriate oxygen saturations on 6 L.  An ABG was obtained after placing  patient on 6 L of oxygen for 3 hours and his O2 saturation was 96%.  Patient currently refusing CPAP machine. Incentive spirometry Maintain O2 saturations greater than 92%   # Asymptomatic bacteruria  Ucx with proteus mirabilis, and MRSA. MRSA due  to MRSA bacteremia likely in setting of line infection.  Patient is on IV vancomycin . Follow ID for duration of treatment   # HTN Blood pressure remains slightly elevated. Discontinued midodrine  Continue Coreg  and Cardizem  Monitor BP and titrate medications accordingly  # Chronic bilateral pleural effusions Small.  Left greater than right.  Continue HD.    # Chronic pain On oxycodone    # Uncontrolled type 2 diabetes mellitus with hyperglycemia, with long-term current use of insulin  (HCC) A1c 7.3 though ESRD.  Remains poorly controlled will increase his Lantus  to 22 units twice daily and his mealtime insulin  to 9 units 3 times daily with meals.     # Chronic constipation Patient on numerous medications for constipation.  Had a bowel movement this a.m. --Continue with aggressive bowel regimen      # Hypothyroidism: On levothyroxine    # Vitamin D  deficiency: Started Calcitrol 0.5 mcg p.o. daily. follow with PCP to repeat vitamin D  level in between 3 to 6 months.  Obesity class II Body mass index is 40.96 kg/m.  Interventions: Calorie restricted diet advised to lose body weight.    Wound 09/29/24 1832 Pressure Injury Buttocks Right;Left Stage 2 -  Partial thickness loss of dermis presenting as a shallow open injury with a red, pink wound bed without slough. (Active)     Diet: Carb modified diet DVT Prophylaxis: Subcutaneous Heparin     Advance goals of care discussion: Full code  Family Communication: family was not present at bedside, at the time of interview.  The pt provided permission to discuss medical plan with the family. Opportunity was given to ask question and all questions were answered satisfactorily.   Disposition:  Pt is from [facility]F LTC, admitted with MRSA bacteremia, on IV antibiotics. Patient has severe constipation, enema order placed on 1/16  Discharge back to LTC, when stable, most likely tomorrow a.m. if remains stable.  Follow TOC for dispo  plan  Subjective: No significant events overnight.  Patient did move bowels yesterday after enema but he feels still having a lot of stool, patient said that he did not have a good BM.  Patient agreed for repeat enema today. Abdomen still hurts, no any other complaints   Physical Exam: General: NAD, lying comfortably Appear in no distress, affect appropriate Eyes: PERRLA ENT: Oral Mucosa Clear, moist  Neck: no JVD,  Cardiovascular: S1 and S2 Present, no Murmur,  Respiratory: good air entry bilaterally, decreased bibasilar breath sounds and bibasilar crackles, no significant wheezes Abdomen: BS present, Soft, obese, distended, mild generalized tenderness Skin: no rashes Extremities: s/p bilateral BKA  Neurologic: without any new focal findings Gait not checked due to patient safety concerns  Vitals:   10/11/24 0602 10/11/24 0801 10/11/24 0930 10/11/24 0931  BP:  (!) 143/73    Pulse:  73    Resp:  (!) 22    Temp:  98.9 F (37.2 C)    TempSrc:  Oral    SpO2:  93% 92% 92%  Weight: 129.5 kg     Height:        Intake/Output Summary (Last 24 hours) at 10/11/2024 1633 Last data filed at 10/11/2024 1500 Gross per 24 hour  Intake 1150 ml  Output 1800 ml  Net -650  ml   Filed Weights   10/07/24 1745 10/09/24 1135 10/11/24 0602  Weight: 123.7 kg 125.5 kg 129.5 kg    Data Reviewed: I have personally reviewed and interpreted daily labs, tele strips, imagings as discussed above. I reviewed all nursing notes, pharmacy notes, vitals, pertinent old records I have discussed plan of care as described above with RN and patient/family.  CBC: Recent Labs  Lab 10/06/24 0524 10/07/24 0449 10/08/24 0527 10/09/24 1606 10/10/24 0650  WBC 6.9 7.0 6.1 7.2 6.0  NEUTROABS 4.6 4.1  --   --   --   HGB 9.8* 8.7* 8.5* 8.3* 8.6*  HCT 31.2* 27.6* 27.2* 26.1* 26.4*  MCV 94.0 94.2 93.8 93.2 93.6  PLT 129* 143* 149* 198 200   Basic Metabolic Panel: Recent Labs  Lab 10/06/24 0524  10/07/24 1430 10/08/24 0527 10/09/24 1606 10/10/24 0650  NA 136 132* 132* 131* 130*  K 4.4 4.7 4.3 4.3 4.7  CL 102 99 96* 95* 94*  CO2 25 21* 25 25 24   GLUCOSE 229* 189* 208* 109* 127*  BUN 51* 71* 43* 42* 51*  CREATININE 4.33* 6.45* 4.78* 4.83* 5.54*  CALCIUM  8.1* 7.7* 7.9* 8.2* 8.4*  MG  --   --  1.9 2.1 1.9  PHOS 4.2 5.6* 4.2 4.1 5.3*    Studies: DG Abd 1 View Result Date: 10/11/2024 EXAM: 1 VIEW XRAY OF THE ABDOMEN 10/11/2024 11:15:00 AM COMPARISON: 10/09/2024 CLINICAL HISTORY: [id number] Ileus [facility]) [id number] Ileus (HCC) [id number] Ileus (HCC) FINDINGS: BOWEL: Moderate distention of the stomach and gas-distended loops of bowel throughout the abdomen. Moderate stool burden. SOFT TISSUES: No abnormal calcifications. BONES: Thoracic fusion hardware in place. No acute fracture. IMPRESSION: 1. Gas distended loops of bowel throughout the abdomen may represent ileus. Moderate colonic stool burden. Electronically signed by: Michaeline Blanch MD 10/11/2024 11:24 AM EST RP Workstation: [id number]   DG Chest Port 1 View Result Date: 10/11/2024 EXAM: 1 VIEW XRAY OF THE CHEST 10/11/2024 11:15:00 AM COMPARISON: 10/03/2024 CLINICAL HISTORY: Ileus (HCC) FINDINGS: LINES, TUBES AND DEVICES: Left IJ CVC in place with tip at the brachiocephalic vein/SVC confluence. LUNGS AND PLEURA: Low lung volumes. Diffuse interstitial opacities, likely pulmonary edema. Bibasilar pulmonary opacities. Moderate left and small right pleural effusions. No pneumothorax. HEART AND MEDIASTINUM: Similar cardiomegaly. No acute abnormality of the mediastinal silhouette. BONES AND SOFT TISSUES: Partially imaged thoracolumbar fusion hardware. No acute osseous abnormality. IMPRESSION: 1. Left IJ central venous catheter with tip at the confluence of the brachiocephalic veins. No pneumothorax. 2. Likely pulmonary edema. 3. Moderate left and small right pleural effusions with bibasilar opacity . Electronically signed by: Michaeline Blanch MD 10/11/2024 11:22  AM EST RP Workstation: [id number]     Scheduled Meds:  bisacodyl   10 mg Oral QHS   calcitRIOL   0.5 mcg Oral Daily   carvedilol   6.25 mg Oral BID WC   Chlorhexidine  Gluconate Cloth  6 each Topical Q0600   cyanocobalamin   1,000 mcg Oral Daily   diltiazem   240 mg Oral QPM   doxepin   10 mg Oral QHS   famotidine   10 mg Oral Daily   folic acid   1 mg Oral Daily   gabapentin   300 mg Oral QHS   heparin   5,000 Units Subcutaneous Q8H   insulin  aspart  0-5 Units Subcutaneous QHS   insulin  aspart  0-6 Units Subcutaneous TID WC   insulin  aspart  9 Units Subcutaneous TID WC   insulin  glargine  22 Units Subcutaneous BID   levothyroxine   175 mcg  Oral QHS   linaclotide   145 mcg Oral QAC breakfast   loratadine   10 mg Oral Daily   nicotine   14 mg Transdermal Daily   pantoprazole   40 mg Oral Daily   polyethylene glycol  17 g Oral BID   senna-docusate  1 tablet Oral BID   simethicone   80 mg Oral QID   Continuous Infusions:  anticoagulant sodium citrate      vancomycin  Stopped (10/10/24 1905)   PRN Meds: acetaminophen  **OR** acetaminophen , albuterol , anticoagulant sodium citrate , bisacodyl , guaiFENesin , liver oil-zinc  oxide, nicotine  polacrilex, ondansetron  **OR** ondansetron  (ZOFRAN ) IV, oxyCODONE   Time spent: 40 minutes  Author: ELVAN SOR. MD [facility] 10/11/2024 4:33 PM  To reach On-call, see care teams to locate the attending and reach out to them via [email or web address]. If 7PM-7AM, please contact night-coverage If you still have difficulty reaching the attending provider, please page the [facility] (Director on Call) for [facility] on amion for assistance.

## 2024-10-11 NOTE — Progress Notes (Signed)
 " [facility]  PROGRESS NOTE   Subjective:   Feels much better.  Scheduled for dialysis tomorrow.  Objective:  Vital signs: Blood pressure (!) 143/73, pulse 73, temperature 98.9 F (37.2 C), temperature source Oral, resp. rate (!) 22, height 5' 10 (1.778 m), weight 129.5 kg, SpO2 92%.  Intake/Output Summary (Last 24 hours) at 10/11/2024 1559 Last data filed at 10/11/2024 1500 Gross per 24 hour  Intake 1150 ml  Output 1800 ml  Net -650 ml   Filed Weights   10/07/24 1745 10/09/24 1135 10/11/24 0602  Weight: 123.7 kg 125.5 kg 129.5 kg     Physical Exam: General:  No acute distress  Head:  Normocephalic, atraumatic. Moist oral mucosal membranes  Eyes:  Anicteric  Neck:  Supple  Lungs:   Clear to auscultation, normal effort  Heart:  S1S2 no rubs  Abdomen:   Soft, nontender, bowel sounds present  Extremities:  peripheral edema.  Neurologic:  Awake, alert, following commands  Skin:  No lesions  Access:     Basic Metabolic Panel: Recent Labs  Lab 10/06/24 0524 10/07/24 1430 10/08/24 0527 10/09/24 1606 10/10/24 0650  NA 136 132* 132* 131* 130*  K 4.4 4.7 4.3 4.3 4.7  CL 102 99 96* 95* 94*  CO2 25 21* 25 25 24   GLUCOSE 229* 189* 208* 109* 127*  BUN 51* 71* 43* 42* 51*  CREATININE 4.33* 6.45* 4.78* 4.83* 5.54*  CALCIUM  8.1* 7.7* 7.9* 8.2* 8.4*  MG  --   --  1.9 2.1 1.9  PHOS 4.2 5.6* 4.2 4.1 5.3*   GFR: Estimated Creatinine Clearance: 18.9 mL/min (A) (by C-G formula based on SCr of 5.54 mg/dL (H)).  Liver Function Tests: Recent Labs  Lab 10/06/24 0524 10/07/24 1430  ALBUMIN  2.6* 2.5*   No results for input(s): LIPASE, AMYLASE in the last 168 hours. No results for input(s): AMMONIA in the last 168 hours.  CBC: Recent Labs  Lab 10/06/24 0524 10/07/24 0449 10/08/24 0527 10/09/24 1606 10/10/24 0650  WBC 6.9 7.0 6.1 7.2 6.0  NEUTROABS 4.6 4.1  --   --   --   HGB 9.8* 8.7* 8.5* 8.3* 8.6*  HCT 31.2* 27.6* 27.2* 26.1* 26.4*  MCV 94.0  94.2 93.8 93.2 93.6  PLT 129* 143* 149* 198 200     HbA1C: Hgb A1c MFr Bld  Date/Time Value Ref Range Status  06/24/2024 05:05 AM 7.3 (H) 4.8 - 5.6 % Final    Comment:    (NOTE) Diagnosis of Diabetes The following HbA1c ranges recommended by the American Diabetes Association (ADA) may be used as an aid in the diagnosis of diabetes mellitus.  Hemoglobin             Suggested A1C NGSP%              Diagnosis  <5.7                   Non Diabetic  5.7-6.4                Pre-Diabetic  >6.4                   Diabetic  <7.0                   Glycemic control for                       adults with diabetes.    04/14/2024 06:43 AM 6.0 (  H) 4.8 - 5.6 % Final    Comment:    (NOTE) Diagnosis of Diabetes The following HbA1c ranges recommended by the American Diabetes Association (ADA) may be used as an aid in the diagnosis of diabetes mellitus.  Hemoglobin             Suggested A1C NGSP%              Diagnosis  <5.7                   Non Diabetic  5.7-6.4                Pre-Diabetic  >6.4                   Diabetic  <7.0                   Glycemic control for                       adults with diabetes.      Urinalysis: No results for input(s): COLORURINE, LABSPEC, PHURINE, GLUCOSEU, HGBUR, BILIRUBINUR, KETONESUR, PROTEINUR, UROBILINOGEN, NITRITE, LEUKOCYTESUR in the last 72 hours.  Invalid input(s): APPERANCEUR    Imaging: DG Abd 1 View Result Date: 10/11/2024 EXAM: 1 VIEW XRAY OF THE ABDOMEN 10/11/2024 11:15:00 AM COMPARISON: 10/09/2024 CLINICAL HISTORY: 01250 Ileus [facility]) [id number] Ileus (HCC) [id number] Ileus (HCC) FINDINGS: BOWEL: Moderate distention of the stomach and gas-distended loops of bowel throughout the abdomen. Moderate stool burden. SOFT TISSUES: No abnormal calcifications. BONES: Thoracic fusion hardware in place. No acute fracture. IMPRESSION: 1. Gas distended loops of bowel throughout the abdomen may represent ileus. Moderate colonic  stool burden. Electronically signed by: Michaeline Blanch MD 10/11/2024 11:24 AM EST RP Workstation: [id number]   DG Chest Port 1 View Result Date: 10/11/2024 EXAM: 1 VIEW XRAY OF THE CHEST 10/11/2024 11:15:00 AM COMPARISON: 10/03/2024 CLINICAL HISTORY: Ileus (HCC) FINDINGS: LINES, TUBES AND DEVICES: Left IJ CVC in place with tip at the brachiocephalic vein/SVC confluence. LUNGS AND PLEURA: Low lung volumes. Diffuse interstitial opacities, likely pulmonary edema. Bibasilar pulmonary opacities. Moderate left and small right pleural effusions. No pneumothorax. HEART AND MEDIASTINUM: Similar cardiomegaly. No acute abnormality of the mediastinal silhouette. BONES AND SOFT TISSUES: Partially imaged thoracolumbar fusion hardware. No acute osseous abnormality. IMPRESSION: 1. Left IJ central venous catheter with tip at the confluence of the brachiocephalic veins. No pneumothorax. 2. Likely pulmonary edema. 3. Moderate left and small right pleural effusions with bibasilar opacity . Electronically signed by: Michaeline Blanch MD 10/11/2024 11:22 AM EST RP Workstation: [id number]     Medications:    anticoagulant sodium citrate      vancomycin  Stopped (10/10/24 1905)    bisacodyl   10 mg Oral QHS   calcitRIOL   0.5 mcg Oral Daily   carvedilol   6.25 mg Oral BID WC   Chlorhexidine  Gluconate Cloth  6 each Topical Q0600   cyanocobalamin   1,000 mcg Oral Daily   diltiazem   240 mg Oral QPM   doxepin   10 mg Oral QHS   famotidine   10 mg Oral Daily   folic acid   1 mg Oral Daily   gabapentin   300 mg Oral QHS   heparin   5,000 Units Subcutaneous Q8H   insulin  aspart  0-5 Units Subcutaneous QHS   insulin  aspart  0-6 Units Subcutaneous TID WC   insulin  aspart  9 Units Subcutaneous TID WC   insulin  glargine  22 Units Subcutaneous BID   levothyroxine   175 mcg Oral QHS   linaclotide   145 mcg Oral QAC breakfast   loratadine   10 mg Oral Daily   nicotine   14 mg Transdermal Daily   pantoprazole   40 mg Oral Daily   polyethylene  glycol  17 g Oral BID   senna-docusate  1 tablet Oral BID   simethicone   80 mg Oral QID    Assessment/ Plan:     64 y.o.  male with past medical conditions including diabetes, hepatitis C, bilateral BKA, anxiety and depression, and end-stage renal disease on hemodialysis.  Patient presents to the emergency department from his facility complaining of shortness of breath.  Patient is currently admitted for SIRS (systemic inflammatory response syndrome) (HCC) [R65.10]    #1: ESRD: Status post internal jugular permacath.  Scheduled for dialysis tomorrow morning   #2: Anemia: Continue anemia protocols with Retacrit .   #3: Hypertension: Continue carvedilol .   #4: Diabetes: Continue with insulin  protocols.   #5: Second hyperparathyroidism: Will monitor PTH, calcium  and phosphorus levels.  Labs and medications reviewed. Will continue to follow along with you.   LOS: 12 Pinkey Edman, MD [facility] 1/18/20263:59 PM  "

## 2024-10-11 NOTE — Plan of Care (Signed)

## 2024-10-12 ENCOUNTER — Inpatient Hospital Stay

## 2024-10-12 DIAGNOSIS — R6510 Systemic inflammatory response syndrome (SIRS) of non-infectious origin without acute organ dysfunction: Secondary | ICD-10-CM | POA: Diagnosis not present

## 2024-10-12 LAB — GLUCOSE, CAPILLARY
Glucose-Capillary: 110 mg/dL — ABNORMAL HIGH (ref 70–99)
Glucose-Capillary: 70 mg/dL (ref 70–99)
Glucose-Capillary: 98 mg/dL (ref 70–99)
Glucose-Capillary: 175 mg/dL — ABNORMAL HIGH (ref 70–99)
Glucose-Capillary: 179 mg/dL — ABNORMAL HIGH (ref 70–99)

## 2024-10-12 LAB — HCV RNA QUANT RFLX ULTRA OR GENOTYP
HCV RNA Qnt(log copy/mL): 6.965 {Log_IU}/mL
HepC Qn: 9230000 [IU]/mL

## 2024-10-12 LAB — VANCOMYCIN, RANDOM: Vancomycin Rm: 28 ug/mL

## 2024-10-12 LAB — CULTURE, BLOOD (ROUTINE X 2)

## 2024-10-12 LAB — HEPATITIS C GENOTYPE: Hepatitis C Genotype: 3

## 2024-10-12 MED ORDER — MINERAL OIL RE ENEM
1.0000 | ENEMA | Freq: Once | RECTAL | Status: AC
  Administered 2024-10-12: 1 via RECTAL

## 2024-10-12 MED ORDER — LIDOCAINE HCL (PF) 1 % IJ SOLN: 10.0000 mL | Freq: Once | INTRAMUSCULAR | Status: DC

## 2024-10-12 MED ORDER — EPOETIN ALFA 4000 UNIT/ML IJ SOLN
INTRAMUSCULAR | Status: AC
  Filled 2024-10-12: qty 1

## 2024-10-12 MED ORDER — HEPARIN SODIUM (PORCINE) 1000 UNIT/ML IJ SOLN
INTRAMUSCULAR | Status: AC
  Filled 2024-10-12: qty 4

## 2024-10-12 MED ORDER — EPOETIN ALFA-EPBX 10000 UNIT/ML IJ SOLN
4000.0000 [IU] | INTRAMUSCULAR | Status: DC
  Administered 2024-10-12 – 2024-10-14 (×2): 4000 [IU] via INTRAVENOUS
  Filled 2024-10-12: qty 1

## 2024-10-12 MED ORDER — EPOETIN ALFA-EPBX 4000 UNIT/ML IJ SOLN
INTRAMUSCULAR | Status: AC
  Filled 2024-10-12: qty 1

## 2024-10-12 MED ORDER — BISACODYL 10 MG RE SUPP
10.0000 mg | Freq: Once | RECTAL | Status: AC
  Administered 2024-10-12: 10 mg via RECTAL
  Filled 2024-10-12: qty 1

## 2024-10-12 MED ORDER — VANCOMYCIN HCL IN DEXTROSE 1-5 GM/200ML-% IV SOLN
1000.0000 mg | INTRAVENOUS | Status: DC
  Administered 2024-10-12 – 2024-10-14 (×2): 1000 mg via INTRAVENOUS
  Filled 2024-10-12 (×3): qty 200

## 2024-10-12 NOTE — Progress Notes (Signed)
 Pt alert and oriented x4. Tolerated tx well. No c/o at this time. Hand off report given to Kristin Morrison, RN.

## 2024-10-12 NOTE — Plan of Care (Signed)

## 2024-10-12 NOTE — Consult Note (Addendum)
 Pharmacy Antibiotic Note  Eugene [name] is a 64 y.o. male with a PMH of ESRD on HD MWF, HTN, DM, HLD and anxiety is admitted on 09/29/2024 with sepsis  and SOB. Pharmacy has been consulted for vancomycin   dosing.  Today, 10/12/2024 Day #13 vancomycin  Renal: ESRD on HD - MWF HD catheter removed 1/8 for line holiday New line placed 1/12 WBC WNL Afebrile 1/6 blood cx: 4/4 GPC, BCID MRSA 1/9 repeat blood cx: NGTD 1/8 cath tip. S. aureus Urine cx: >100K P mirabilis, MRSA  Vancomycin  dosing and levels:  Vancomycin  2gm IV x 1 1/6 at 2236. Per HD documentation received about 2h of HD on 1/7.  Random vancomycin  level 1/9 = 18 mcg/mL, vancomycin  1gm x 1 given 1/9 at 12:12 1/12 1410 random vancomycin  level = 12 mcg/ml.  No doses give since 1/9.  Level not drawn with am labs as ordered 1/19 random - pre-hemodialysis level at 05:48 = 28 mcg/mL    Plan: Based on random vancomycin  level this morning prior to HD = 28 ,cg/ml (goal 15-25 mcg/mL) will decrease dose to 1gm IV with HD MWF Follow levels while here - recheck check in 3-4 HD sessions OPAT updated  Height: 5' 10 (177.8 cm) Weight: 129.5 kg (285 lb 7.9 oz) IBW/kg (Calculated) : 73  Temp (24hrs), Avg:98.2 F (36.8 C), Min:97.8 F (36.6 C), Max:98.6 F (37 C)  Recent Labs  Lab 10/05/24 1410 10/06/24 0524 10/07/24 0449 10/07/24 1430 10/08/24 0527 10/09/24 1606 10/10/24 0650 10/12/24 0548  WBC  --  6.9 7.0  --  6.1 7.2 6.0  --   CREATININE 3.89* 4.33*  --  6.45* 4.78* 4.83* 5.54*  --   VANCORANDOM 12  --   --   --   --   --   --  28    Estimated Creatinine Clearance: 18.9 mL/min (A) (by C-G formula based on SCr of 5.54 mg/dL (H)).    Allergies[1]  Antimicrobials this admission: 1/6 cefepime  >> 1/6 1/6 vancomycin  >>   Thank you for allowing pharmacy to be a part of this patients care.  Belina Mandile, PharmD, BCPS, BCIDP Work Cell: [phone] 10/12/2024 10:14 AM             [1] No Known Allergies

## 2024-10-12 NOTE — Progress Notes (Signed)
 OT Cancellation Note  Patient Details Name: Eugene [name] MRN: [id number] DOB: 1961-02-11   Cancelled Treatment:    Reason Eval/Treat Not Completed: Patient at procedure or test/ unavailable. Pt out of the room for dialysis. Will re-attempt OT tx at later date/time as appropriate.   Licia Harl R., MPH, MS, OTR/L ascom [phone] 10/12/24, 2:05 PM

## 2024-10-12 NOTE — Progress Notes (Signed)
 PHARMACY CONSULT NOTE FOR:  OUTPATIENT  PARENTERAL ANTIBIOTIC THERAPY (OPAT)  Indication: MRSA bacteremia Regimen: vancomycin  1000mg  IV with HD on MWF* End date: 11/11/2024 *Note - based on pre-HD level 1/19, vancomycin  dose adjusted from 1250mg  to 1000mg  with HD  Labs - Monday:  CBC/D, CMP, and pre-HD vancomycin  level Fax weekly lab results  promptly to [phone] Call [phone] with questions or critical value To be given at HD center (nephrology team at [facility] aware)  IV antibiotic discharge orders are pended. To discharging provider:  please sign these orders via discharge navigator,  Select New Orders & click on the button choice - Manage This Unsigned Work.     Thank you for allowing pharmacy to be a part of this patient's care.  Tobi Groesbeck, PharmD, BCPS, BCIDP Work Cell: [phone] 10/12/2024 10:19 AM

## 2024-10-12 NOTE — Progress Notes (Signed)
 [facility] Progress Note  Patient: Eugene [name]    FMW:[id number]  DOA: 09/29/2024     Date of Service: the patient was seen and examined on 10/12/2024  Chief Complaint  Patient presents with   Shortness of Breath   Brief hospital course:  Eugene [name] is a 64 y.o. male with medical history significant of anxiety, depression, type 2 diabetes mellitus, history of hepatitis C, end-stage renal disease on dialysis, bilateral BKA.  Patient known to most historian.  States he is short of breath.  He is sick to his stomach.  He vomited.  No diarrhea.  He is short of breath.  He is having a fever.  In the ER he was found to have an elevated white blood cell count and started on sepsis protocol.  Respiratory panel pending, chest x-ray shows mild interstitial edema and left greater than right pleural effusion.  Urine looks positive and patient has a suprapubic catheter.  Hospitalist services contacted for further evaluation.      Assessment and Plan:  # Sepsis secondary to MRSA bacteremia Present on admission.  Sepsis criteria fever leukocytosis and tachycardia.  Now resolved.  MRSA positive in 4 out of 4 bottles. HD catheter removed 1/8 Afebrile overnight Repeat blood cultures no growth to date Continue vancomycin  -- Continue to follow blood cultures --CT of the chest given patient intermittently requiring 10 L of oxygen up from baseline of 6 L 1/13 CT Chest negative for PE, bilateral pleural effusion, mild to moderate.  Atelectasis -- Will likely need 6 weeks of antibiotics given likely cannot undergo TEE 1/14 cardiology consulted to eval for TEE.  Cardiology recommended no TEE, risk outweigh the benefit.  Advised to continue antibiotics for longer duration.  ID is aware. As per ID, continue vancomycin  1250mg  IV with HD on MWF End date: 11/11/2024   # Ileus Small and large bowel ileus 1/14 KUB: Diffusely gas-filled, although not overtly distended, small bowel and colon throughout  abdomen and pelvis. nonobstructive bowel gas pattern. 1/7 CT a/p: notable for proctocolitis but no findings to suggest etiology of his nausea.   --Continue antiemetics, aggressive bowel regimen --Continue zofran  prn 1/15 as per KUB patient may have ileus, discussed with patient regarding NG tube insertion and suction, he refused.  Advised to start liquid diet and avoid solids, remains at high risk for aspiration but he did not wanted liquid diet, wanted to continue regular diet.  He will consider NG tube suctioning if he gets sick or starts vomiting. Continue aspiration precautions. 1/16 KUB shows moderate stool burden, no abnormal bowel dilatation. Mineral oil enema order placed 1/17 patient was refusing MiraLAX , advised to take it regularly and patient agreed for Dulcolax suppository.  Followed by smog enema if no BM after suppository. 1/18 KUB shows moderate stool burden, soapsuds enema order placed 1/19 KUB: Moderate stool burden, decreased colonic distention Dulcolax suppository and mineral enema order placed,   # Normocytic anemia # Thrombocytopenia improving Likely in settong of anemia of chronic disease and infection. H/o B12 deficiency Continue B12 supplementation, folate borderline, continue supplementation    # ESRD (end stage renal disease) (HCC) Hemodialysis per nephrology   # Paroxysmal SVT (supraventricular tachycardia) On Cardizem  and Coreg    # Heart failure with preserved ejection fraction (HCC) Dialysis to manage fluid  # Bilateral pleural effusion 1/19 IR consulted for thoracentesis, effusion Left >Right but could not drain, no safe window.  IR will try thoracentesis tomorrow a.m.   # Acute on chronic respiratory failure with  hypoxia (HCC) resolved Patient oxygen level intermittently increased for comfort, he maintained appropriate oxygen saturations on 6 L.  An ABG was obtained after placing patient on 6 L of oxygen for 3 hours and his O2 saturation was 96%.   Patient currently refusing CPAP machine. Incentive spirometry Maintain O2 saturations greater than 92%   # Asymptomatic bacteruria  Ucx with proteus mirabilis, and MRSA. MRSA due to MRSA bacteremia likely in setting of line infection.  Patient is on IV vancomycin . Follow ID for duration of treatment   # HTN Blood pressure remains slightly elevated. Discontinued midodrine  Continue Coreg  and Cardizem  Monitor BP and titrate medications accordingly  # Chronic bilateral pleural effusions Small.  Left greater than right.  Continue HD.    # Chronic pain On oxycodone    # Uncontrolled type 2 diabetes mellitus with hyperglycemia, with long-term current use of insulin  (HCC) A1c 7.3 though ESRD.  Remains poorly controlled will increase his Lantus  to 22 units twice daily and his mealtime insulin  to 9 units 3 times daily with meals.     # Chronic constipation Patient on numerous medications for constipation.  Had a bowel movement this a.m. --Continue with aggressive bowel regimen      # Hypothyroidism: On levothyroxine    # Vitamin D  deficiency: Started Calcitrol 0.5 mcg p.o. daily. follow with PCP to repeat vitamin D  level in between 3 to 6 months.  Obesity class II Body mass index is 40.96 kg/m.  Interventions: Calorie restricted diet advised to lose body weight.    Wound 09/29/24 1832 Pressure Injury Buttocks Right;Left Stage 2 -  Partial thickness loss of dermis presenting as a shallow open injury with a red, pink wound bed without slough. (Active)     Diet: Carb modified diet DVT Prophylaxis: Subcutaneous Heparin     Advance goals of care discussion: Full code  Family Communication: family was not present at bedside, at the time of interview.  The pt provided permission to discuss medical plan with the family. Opportunity was given to ask question and all questions were answered satisfactorily.   Disposition:  Pt is from [facility] LTC, admitted with MRSA bacteremia, on IV  antibiotics. Patient has severe constipation, enema order placed on 1/16  Discharge back to LTC, when stable, most likely tomorrow a.m. if remains stable.  Follow TOC for dispo plan  Subjective: No significant events overnight.  Patient still feels that he has not had a good BM after enema yesterday. Order placed for Dulcolax depository and mineral enema.  X-ray reviewed is still shows moderate stool burden    Physical Exam: General: NAD, lying comfortably Appear in no distress, affect appropriate Eyes: PERRLA ENT: Oral Mucosa Clear, moist  Neck: no JVD,  Cardiovascular: S1 and S2 Present, no Murmur,  Respiratory: good air entry bilaterally, decreased bibasilar breath sounds and bibasilar crackles, no significant wheezes Abdomen: BS present, Soft, obese, distended, mild generalized tenderness Skin: no rashes Extremities: s/p bilateral BKA  Neurologic: without any new focal findings Gait not checked due to patient safety concerns  Vitals:   10/12/24 0828 10/12/24 0900 10/12/24 0951 10/12/24 1125  BP: 127/72  (!) 99/51   Pulse: 69  63   Resp: 16     Temp: 98.6 F (37 C)     TempSrc:      SpO2: 98% 97% 91% 94%  Weight:      Height:        Intake/Output Summary (Last 24 hours) at 10/12/2024 1344 Last data filed at 10/12/2024 1300  Gross per 24 hour  Intake 360 ml  Output 2025 ml  Net -1665 ml   Filed Weights   10/07/24 1745 10/09/24 1135 10/11/24 0602  Weight: 123.7 kg 125.5 kg 129.5 kg    Data Reviewed: I have personally reviewed and interpreted daily labs, tele strips, imagings as discussed above. I reviewed all nursing notes, pharmacy notes, vitals, pertinent old records I have discussed plan of care as described above with RN and patient/family.  CBC: Recent Labs  Lab 10/06/24 0524 10/07/24 0449 10/08/24 0527 10/09/24 1606 10/10/24 0650  WBC 6.9 7.0 6.1 7.2 6.0  NEUTROABS 4.6 4.1  --   --   --   HGB 9.8* 8.7* 8.5* 8.3* 8.6*  HCT 31.2* 27.6* 27.2* 26.1*  26.4*  MCV 94.0 94.2 93.8 93.2 93.6  PLT 129* 143* 149* 198 200   Basic Metabolic Panel: Recent Labs  Lab 10/06/24 0524 10/07/24 1430 10/08/24 0527 10/09/24 1606 10/10/24 0650  NA 136 132* 132* 131* 130*  K 4.4 4.7 4.3 4.3 4.7  CL 102 99 96* 95* 94*  CO2 25 21* 25 25 24   GLUCOSE 229* 189* 208* 109* 127*  BUN 51* 71* 43* 42* 51*  CREATININE 4.33* 6.45* 4.78* 4.83* 5.54*  CALCIUM  8.1* 7.7* 7.9* 8.2* 8.4*  MG  --   --  1.9 2.1 1.9  PHOS 4.2 5.6* 4.2 4.1 5.3*    Studies: DG Abd 1 View Result Date: 10/12/2024 EXAM: 1 VIEW XRAY OF THE ABDOMEN 10/12/2024 11:01:00 AM COMPARISON: 10/11/2024 CLINICAL HISTORY: [id number] Ileus [facility]) [id number] Ileus (HCC) [phone] Ileus (HCC) [id number] Ileus (HCC) [id number] [phone] Ileus (HCC) [id number] FINDINGS: BOWEL: Interval decreased gaseous distention of the bowel. Moderate colonic stool burden. The bowel gas pattern is nonobstructive. SOFT TISSUES: Small right pleural effusion. Probable left effusion and adjacent parenchymal opacity. No abnormal calcifications. BONES: Thoracic spinal fixation hardware in place. No acute fracture. IMPRESSION: 1. Decreased gaseous distention of the bowel. 2. Moderate colonic stool burden. 3. Small right pleural effusion with probable left pleural effusion and adjacent parenchymal opacity. Electronically signed by: Dayne Hassell MD 10/12/2024 12:21 PM EST RP Workstation: [id number]   US  CHEST (PLEURAL EFFUSION) Result Date: 10/12/2024 EXAM: US  Chest Limited, Evaluate for Pleural Effusion. 10/12/2024 10:57:09 AM TECHNIQUE: Real-time ultrasound of the chest to evaluate for pleural effusion. COMPARISON: CT 10/06/2024. CLINICAL HISTORY: Pleural effusion on right. FINDINGS: LEFT PLEURAL SPACE: Small left pleural effusion. RIGHT PLEURAL SPACE: Small right pleural effusion with some adjacent atelectatic/consolidated pulmonary parenchyma. IMPRESSION: 1. Small right pleural effusion with adjacent atelectatic/consolidated pulmonary parenchyma. 2. Small  left pleural effusion. Electronically signed by: Dayne Hassell MD 10/12/2024 12:19 PM EST RP Workstation: [id number]     Scheduled Meds:  bisacodyl   10 mg Oral QHS   calcitRIOL   0.5 mcg Oral Daily   carvedilol   6.25 mg Oral BID WC   Chlorhexidine  Gluconate Cloth  6 each Topical Q0600   cyanocobalamin   1,000 mcg Oral Daily   diltiazem   240 mg Oral QPM   doxepin   10 mg Oral QHS   epoetin  alfa-epbx (RETACRIT ) injection  4,000 Units Intravenous Q M,W,F-1800   famotidine   10 mg Oral Daily   folic acid   1 mg Oral Daily   gabapentin   300 mg Oral QHS   heparin   5,000 Units Subcutaneous Q8H   insulin  aspart  0-5 Units Subcutaneous QHS   insulin  aspart  0-6 Units Subcutaneous TID WC   insulin  aspart  9 Units Subcutaneous TID WC   insulin  glargine  22 Units Subcutaneous BID   levothyroxine   175 mcg Oral QHS   lidocaine  (PF)  10 mL Other Once   linaclotide   145 mcg Oral QAC breakfast   loratadine   10 mg Oral Daily   mineral oil  1 enema Rectal Once   nicotine   14 mg Transdermal Daily   pantoprazole   40 mg Oral Daily   polyethylene glycol  17 g Oral BID   senna-docusate  1 tablet Oral BID   simethicone   80 mg Oral QID   Continuous Infusions:  anticoagulant sodium citrate      vancomycin      PRN Meds: acetaminophen  **OR** acetaminophen , albuterol , anticoagulant sodium citrate , bisacodyl , guaiFENesin , liver oil-zinc  oxide, nicotine  polacrilex, ondansetron  **OR** ondansetron  (ZOFRAN ) IV, oxyCODONE   Time spent: 40 minutes  Author: ELVAN SOR. MD [facility] 10/12/2024 1:44 PM  To reach On-call, see care teams to locate the attending and reach out to them via [email or web address]. If 7PM-7AM, please contact night-coverage If you still have difficulty reaching the attending provider, please page the [facility] (Director on Call) for [facility] on amion for assistance.

## 2024-10-12 NOTE — Progress Notes (Signed)
 " [facility]  ROUNDING NOTE   Subjective:   Eugene [name] is a 64 year old male with past medical conditions including diabetes, hepatitis C, bilateral BKA, anxiety and depression, and end-stage renal disease on hemodialysis.  Patient presents to the emergency department from his facility complaining of shortness of breath.  Patient is currently admitted for SIRS (systemic inflammatory response syndrome) (HCC) [R65.10]  Patient is known to our practice and receives outpatient dialysis treatments at [facility] on a MWF schedule, supervised by Dr. Dennise.    Update: Patient eating breakfast Alert and oriented 8L HFNC  Continues to have adequate UOP   Objective:  Vital signs in last 24 hours:  Temp:  [97.8 F (36.6 C)-98.6 F (37 C)] 98.6 F (37 C) (01/19 0828) Pulse Rate:  [63-72] 63 (01/19 0951) Resp:  [16-20] 16 (01/19 0828) BP: (99-131)/(51-72) 99/51 (01/19 0951) SpO2:  [91 %-98 %] 94 % (01/19 1125)  Weight change:  Filed Weights   10/07/24 1745 10/09/24 1135 10/11/24 0602  Weight: 123.7 kg 125.5 kg 129.5 kg    Intake/Output: I/O last 3 completed shifts: In: 1090 [P.O.:840; IV Piggyback:250] Out: 3175 [Urine:3175]   Intake/Output this shift:  No intake/output data recorded.  Physical Exam: General: NAD  Head: Normocephalic  Eyes: Anicteric  Lungs:  Wheeze, 8 L HFNC  Heart: Regular rate and rhythm  Abdomen:  Soft, nontender  Extremities: Trace edema.  Bilateral BKA  Neurologic: Awake, alert, conversant  Skin: Warm,dry, no rash  Access: Lt internal jugular permcath    Basic Metabolic Panel: Recent Labs  Lab 10/06/24 0524 10/07/24 1430 10/08/24 0527 10/09/24 1606 10/10/24 0650  NA 136 132* 132* 131* 130*  K 4.4 4.7 4.3 4.3 4.7  CL 102 99 96* 95* 94*  CO2 25 21* 25 25 24   GLUCOSE 229* 189* 208* 109* 127*  BUN 51* 71* 43* 42* 51*  CREATININE 4.33* 6.45* 4.78* 4.83* 5.54*  CALCIUM  8.1* 7.7* 7.9* 8.2* 8.4*  MG  --   --  1.9 2.1 1.9   PHOS 4.2 5.6* 4.2 4.1 5.3*    Liver Function Tests: Recent Labs  Lab 10/06/24 0524 10/07/24 1430  ALBUMIN  2.6* 2.5*   No results for input(s): LIPASE, AMYLASE in the last 168 hours.  No results for input(s): AMMONIA in the last 168 hours.  CBC: Recent Labs  Lab 10/06/24 0524 10/07/24 0449 10/08/24 0527 10/09/24 1606 10/10/24 0650  WBC 6.9 7.0 6.1 7.2 6.0  NEUTROABS 4.6 4.1  --   --   --   HGB 9.8* 8.7* 8.5* 8.3* 8.6*  HCT 31.2* 27.6* 27.2* 26.1* 26.4*  MCV 94.0 94.2 93.8 93.2 93.6  PLT 129* 143* 149* 198 200    Cardiac Enzymes: No results for input(s): CKTOTAL, CKMB, CKMBINDEX, TROPONINI in the last 168 hours.  BNP: Invalid input(s): POCBNP  CBG: Recent Labs  Lab 10/11/24 1222 10/11/24 1710 10/11/24 2107 10/12/24 0827 10/12/24 1141  GLUCAP 139* 113* 119* 110* 70    Microbiology: Results for orders placed or performed during the hospital encounter of 09/29/24  Resp panel by RT-PCR (RSV, Flu A&B, Covid) Anterior Nasal Swab     Status: None   Collection Time: 09/29/24  2:16 PM   Specimen: Anterior Nasal Swab  Result Value Ref Range Status   SARS Coronavirus 2 by RT PCR NEGATIVE NEGATIVE Final    Comment: (NOTE) SARS-CoV-2 target nucleic acids are NOT DETECTED.  The SARS-CoV-2 RNA is generally detectable in upper respiratory specimens during the acute phase of  infection. The lowest concentration of SARS-CoV-2 viral copies this assay can detect is 138 copies/mL. A negative result does not preclude SARS-Cov-2 infection and should not be used as the sole basis for treatment or other patient management decisions. A negative result may occur with  improper specimen collection/handling, submission of specimen other than nasopharyngeal swab, presence of viral mutation(s) within the areas targeted by this assay, and inadequate number of viral copies(<138 copies/mL). A negative result must be combined with clinical observations, patient history,  and epidemiological information. The expected result is Negative.  Fact Sheet for Patients:  bloggercourse.com  Fact Sheet for Healthcare Providers:  seriousbroker.it  This test is no t yet approved or cleared by the [place]  FDA and  has been authorized for detection and/or diagnosis of SARS-CoV-2 by FDA under an Emergency Use Authorization (EUA). This EUA will remain  in effect (meaning this test can be used) for the duration of the COVID-19 declaration under Section 564(b)(1) of the Act, 21 U.S.C.section 360bbb-3(b)(1), unless the authorization is terminated  or revoked sooner.       Influenza A by PCR NEGATIVE NEGATIVE Final   Influenza B by PCR NEGATIVE NEGATIVE Final    Comment: (NOTE) The Xpert Xpress SARS-CoV-2/FLU/RSV plus assay is intended as an aid in the diagnosis of influenza from Nasopharyngeal swab specimens and should not be used as a sole basis for treatment. Nasal washings and aspirates are unacceptable for Xpert Xpress SARS-CoV-2/FLU/RSV testing.  Fact Sheet for Patients: bloggercourse.com  Fact Sheet for Healthcare Providers: seriousbroker.it  This test is not yet approved or cleared by the [place]  FDA and has been authorized for detection and/or diagnosis of SARS-CoV-2 by FDA under an Emergency Use Authorization (EUA). This EUA will remain in effect (meaning this test can be used) for the duration of the COVID-19 declaration under Section 564(b)(1) of the Act, 21 U.S.C. section 360bbb-3(b)(1), unless the authorization is terminated or revoked.     Resp Syncytial Virus by PCR NEGATIVE NEGATIVE Final    Comment: (NOTE) Fact Sheet for Patients: bloggercourse.com  Fact Sheet for Healthcare Providers: seriousbroker.it  This test is not yet approved or cleared by the [place]  FDA and has  been authorized for detection and/or diagnosis of SARS-CoV-2 by FDA under an Emergency Use Authorization (EUA). This EUA will remain in effect (meaning this test can be used) for the duration of the COVID-19 declaration under Section 564(b)(1) of the Act, 21 U.S.C. section 360bbb-3(b)(1), unless the authorization is terminated or revoked.  Performed at [facility], [address], [place], [place] [address]   Urine Culture     Status: Abnormal   Collection Time: 09/29/24  2:17 PM   Specimen: Urine, Random  Result Value Ref Range Status   Specimen Description   Final    URINE, RANDOM Performed at [facility], [address] [place], [place] [address]    Special Requests   Final    NONE Reflexed from [phone] Performed at [facility], [address] [place], [place] [address]    Culture (A)  Final    >=100,000 COLONIES/mL PROTEUS MIRABILIS >=100,000 COLONIES/mL METHICILLIN RESISTANT STAPHYLOCOCCUS AUREUS    Report Status 10/02/2024 FINAL  Final   Organism ID, Bacteria PROTEUS MIRABILIS (A)  Final   Organism ID, Bacteria METHICILLIN RESISTANT STAPHYLOCOCCUS AUREUS (A)  Final      Susceptibility   Methicillin resistant staphylococcus aureus - MIC*    CIPROFLOXACIN  >=8 RESISTANT Resistant     GENTAMICIN <=0.5  SENSITIVE Sensitive     NITROFURANTOIN <=16 SENSITIVE Sensitive     OXACILLIN >=4 RESISTANT Resistant     TETRACYCLINE <=1 SENSITIVE Sensitive     VANCOMYCIN  1 SENSITIVE Sensitive     TRIMETH /SULFA  <=10 SENSITIVE Sensitive     RIFAMPIN  <=0.5 SENSITIVE Sensitive     Inducible Clindamycin NEGATIVE Sensitive     LINEZOLID  2 SENSITIVE Sensitive     * >=100,000 COLONIES/mL METHICILLIN RESISTANT STAPHYLOCOCCUS AUREUS   Proteus mirabilis - MIC*    AMPICILLIN <=2 SENSITIVE Sensitive     CEFAZOLIN  (URINE) Value in next row Sensitive      4 SENSITIVEThis is a modified FDA-approved test that has been validated and its performance characteristics  determined by the reporting laboratory.  This laboratory is certified under the Clinical Laboratory Improvement Amendments CLIA as qualified to perform high complexity clinical laboratory testing.    CEFEPIME  Value in next row Sensitive      4 SENSITIVEThis is a modified FDA-approved test that has been validated and its performance characteristics determined by the reporting laboratory.  This laboratory is certified under the Clinical Laboratory Improvement Amendments CLIA as qualified to perform high complexity clinical laboratory testing.    ERTAPENEM Value in next row Sensitive      4 SENSITIVEThis is a modified FDA-approved test that has been validated and its performance characteristics determined by the reporting laboratory.  This laboratory is certified under the Clinical Laboratory Improvement Amendments CLIA as qualified to perform high complexity clinical laboratory testing.    CEFTRIAXONE  Value in next row Sensitive      4 SENSITIVEThis is a modified FDA-approved test that has been validated and its performance characteristics determined by the reporting laboratory.  This laboratory is certified under the Clinical Laboratory Improvement Amendments CLIA as qualified to perform high complexity clinical laboratory testing.    CIPROFLOXACIN  Value in next row Resistant      4 SENSITIVEThis is a modified FDA-approved test that has been validated and its performance characteristics determined by the reporting laboratory.  This laboratory is certified under the Clinical Laboratory Improvement Amendments CLIA as qualified to perform high complexity clinical laboratory testing.    GENTAMICIN Value in next row Sensitive      4 SENSITIVEThis is a modified FDA-approved test that has been validated and its performance characteristics determined by the reporting laboratory.  This laboratory is certified under the Clinical Laboratory Improvement Amendments CLIA as qualified to perform high complexity clinical  laboratory testing.    NITROFURANTOIN Value in next row Resistant      4 SENSITIVEThis is a modified FDA-approved test that has been validated and its performance characteristics determined by the reporting laboratory.  This laboratory is certified under the Clinical Laboratory Improvement Amendments CLIA as qualified to perform high complexity clinical laboratory testing.    TRIMETH /SULFA  Value in next row Sensitive      4 SENSITIVEThis is a modified FDA-approved test that has been validated and its performance characteristics determined by the reporting laboratory.  This laboratory is certified under the Clinical Laboratory Improvement Amendments CLIA as qualified to perform high complexity clinical laboratory testing.    AMPICILLIN/SULBACTAM Value in next row Sensitive      4 SENSITIVEThis is a modified FDA-approved test that has been validated and its performance characteristics determined by the reporting laboratory.  This laboratory is certified under the Clinical Laboratory Improvement Amendments CLIA as qualified to perform high complexity clinical laboratory testing.    PIP/TAZO Value in next row Sensitive      <=  4 SENSITIVEThis is a modified FDA-approved test that has been validated and its performance characteristics determined by the reporting laboratory.  This laboratory is certified under the Clinical Laboratory Improvement Amendments CLIA as qualified to perform high complexity clinical laboratory testing.    MEROPENEM Value in next row Sensitive      <=4 SENSITIVEThis is a modified FDA-approved test that has been validated and its performance characteristics determined by the reporting laboratory.  This laboratory is certified under the Clinical Laboratory Improvement Amendments CLIA as qualified to perform high complexity clinical laboratory testing.    * >=100,000 COLONIES/mL PROTEUS MIRABILIS  Blood Culture (routine x 2)     Status: Abnormal   Collection Time: 09/29/24  2:37 PM    Specimen: BLOOD  Result Value Ref Range Status   Specimen Description   Final    BLOOD BLOOD RIGHT FOREARM Performed at [facility], [address] [place], [place] [address]    Special Requests   Final    BOTTLES DRAWN AEROBIC AND ANAEROBIC Blood Culture results may not be optimal due to an inadequate volume of blood received in culture bottles Performed at [facility], [address], [place], [place] [address]    Culture  Setup Time   Final    GRAM POSITIVE COCCI IN BOTH AEROBIC AND ANAEROBIC BOTTLES CRITICAL VALUE NOTED.  VALUE IS CONSISTENT WITH PREVIOUSLY REPORTED AND CALLED VALUE.    Culture (A)  Final    STAPHYLOCOCCUS AUREUS SUSCEPTIBILITIES PERFORMED ON PREVIOUS CULTURE WITHIN THE LAST 5 DAYS. Performed at [facility], [address] [place], [place] [address]    Report Status 10/02/2024 FINAL  Final  Blood Culture (routine x 2)     Status: Abnormal (Preliminary result)   Collection Time: 09/29/24  2:37 PM   Specimen: BLOOD  Result Value Ref Range Status   Specimen Description   Final    BLOOD LEFT ANTECUBITAL Performed at [facility], [address] [place], [place] [address]    Special Requests   Final    BOTTLES DRAWN AEROBIC AND ANAEROBIC Blood Culture results may not be optimal due to an inadequate volume of blood received in culture bottles Performed at [facility], [address] [place], [place] [address]    Culture  Setup Time   Final    GRAM POSITIVE COCCI IN BOTH AEROBIC AND ANAEROBIC BOTTLES CRITICAL RESULT CALLED TO, READ BACK BY AND VERIFIED WITH: PHARMD NATHAN B ON [id number] @0457  BY HNM    Culture (A)  Final    METHICILLIN RESISTANT STAPHYLOCOCCUS AUREUS Sent to [facility] for further susceptibility testing. Performed at [facility], [address] [place], [place] [address]    Report Status PENDING  Incomplete   Organism ID, Bacteria METHICILLIN RESISTANT STAPHYLOCOCCUS AUREUS  Final       Susceptibility   Methicillin resistant staphylococcus aureus - MIC*    CIPROFLOXACIN  >=8 RESISTANT Resistant     ERYTHROMYCIN  >=8 RESISTANT Resistant     GENTAMICIN <=0.5 SENSITIVE Sensitive     OXACILLIN >=4 RESISTANT Resistant     TETRACYCLINE <=1 SENSITIVE Sensitive     VANCOMYCIN  1 SENSITIVE Sensitive     TRIMETH /SULFA  <=10 SENSITIVE Sensitive     CLINDAMYCIN >=8 RESISTANT Resistant     RIFAMPIN  <=0.5 SENSITIVE Sensitive     Inducible Clindamycin NEGATIVE Sensitive     LINEZOLID  2 SENSITIVE Sensitive     * METHICILLIN RESISTANT STAPHYLOCOCCUS AUREUS  Blood Culture ID Panel (Reflexed)  Status: Abnormal   Collection Time: 09/29/24  2:37 PM  Result Value Ref Range Status   Enterococcus faecalis NOT DETECTED NOT DETECTED Final   Enterococcus Faecium NOT DETECTED NOT DETECTED Final   Listeria monocytogenes NOT DETECTED NOT DETECTED Final   Staphylococcus species DETECTED (A) NOT DETECTED Final    Comment: CRITICAL RESULT CALLED TO, READ BACK BY AND VERIFIED WITHBETHA RANKIN DILLS PHARMD [id number] 09/30/24 HNM    Staphylococcus aureus (BCID) DETECTED (A) NOT DETECTED Final    Comment: Methicillin (oxacillin)-resistant Staphylococcus aureus (MRSA). MRSA is predictably resistant to beta-lactam antibiotics (except ceftaroline). Preferred therapy is vancomycin  unless clinically contraindicated. Patient requires contact precautions if  hospitalized. CRITICAL RESULT CALLED TO, READ BACK BY AND VERIFIED WITH: RANKIN DILLS PHARMD [id number] 09/30/24 HNM    Staphylococcus epidermidis NOT DETECTED NOT DETECTED Final   Staphylococcus lugdunensis NOT DETECTED NOT DETECTED Final   Streptococcus species NOT DETECTED NOT DETECTED Final   Streptococcus agalactiae NOT DETECTED NOT DETECTED Final   Streptococcus pneumoniae NOT DETECTED NOT DETECTED Final   Streptococcus pyogenes NOT DETECTED NOT DETECTED Final   A.calcoaceticus-baumannii NOT DETECTED NOT DETECTED Final   Bacteroides fragilis NOT DETECTED NOT  DETECTED Final   Enterobacterales NOT DETECTED NOT DETECTED Final   Enterobacter cloacae complex NOT DETECTED NOT DETECTED Final   Escherichia coli NOT DETECTED NOT DETECTED Final   Klebsiella aerogenes NOT DETECTED NOT DETECTED Final   Klebsiella oxytoca NOT DETECTED NOT DETECTED Final   Klebsiella pneumoniae NOT DETECTED NOT DETECTED Final   Proteus species NOT DETECTED NOT DETECTED Final   Salmonella species NOT DETECTED NOT DETECTED Final   Serratia marcescens NOT DETECTED NOT DETECTED Final   Haemophilus influenzae NOT DETECTED NOT DETECTED Final   Neisseria meningitidis NOT DETECTED NOT DETECTED Final   Pseudomonas aeruginosa NOT DETECTED NOT DETECTED Final   Stenotrophomonas maltophilia NOT DETECTED NOT DETECTED Final   Candida albicans NOT DETECTED NOT DETECTED Final   Candida auris NOT DETECTED NOT DETECTED Final   Candida glabrata NOT DETECTED NOT DETECTED Final   Candida krusei NOT DETECTED NOT DETECTED Final   Candida parapsilosis NOT DETECTED NOT DETECTED Final   Candida tropicalis NOT DETECTED NOT DETECTED Final   Cryptococcus neoformans/gattii NOT DETECTED NOT DETECTED Final   Meth resistant mecA/C and MREJ DETECTED (A) NOT DETECTED Final    Comment: CRITICAL RESULT CALLED TO, READ BACK BY AND VERIFIED WITHBETHA RANKIN DILLS PHARMD [id number] 09/30/24 HNM Performed at [facility], [address] [place], [place] [address]   MIC (1 Drug)-     Status: Abnormal   Collection Time: 09/29/24  2:37 PM  Result Value Ref Range Status   Min Inhibitory Conc (1 Drug) Final report (A)  Corrected    Comment: (NOTE) Performed At: [facility] [address] [place], [place] [id number] Jennette Shorter MD Ey:[id number] CORRECTED ON 01/16 AT 0636: PREVIOUSLY REPORTED AS Preliminary report    Source BLOOD  Final    Comment: Performed at [facility], [address] [place], [place] [address]  MIC Result     Status: Abnormal   Collection Time: 09/29/24  2:37 PM   Result Value Ref Range Status   Result 1 (MIC) Comment (A)  Final    Comment: (NOTE) Methicillin - resistant Staphylococcus aureus Identification performed by account, not confirmed by this laboratory. DAPTOMYCIN     <=1 UG/ML = SUSCEPTIBLE Performed At: [facility] [address] [place], [place] [id number] Jennette Shorter MD Ey:[id number]   MRSA Next Gen by  PCR, Nasal     Status: Abnormal   Collection Time: 09/30/24  5:00 PM   Specimen: Nasal Mucosa; Nasal Swab  Result Value Ref Range Status   MRSA by PCR Next Gen DETECTED (A) NOT DETECTED Final    Comment: RESULT CALLED TO, READ BACK BY AND VERIFIED WITH: TAMMY ORLANDO 2032 09/30/24 MU (NOTE) The GeneXpert MRSA Assay (FDA approved for NASAL specimens only), is one component of a comprehensive MRSA colonization surveillance program. It is not intended to diagnose MRSA infection nor to guide or monitor treatment for MRSA infections. Test performance is not FDA approved in patients less than 73 years old. Performed at [facility], [address] [place], [place] [address]   Cath Tip Culture     Status: Abnormal   Collection Time: 10/01/24 11:03 AM   Specimen: Catheter Tip; Other  Result Value Ref Range Status   Specimen Description   Final    CATH TIP Performed at [facility], [address] [place], [place] [address]    Special Requests   Final    NONE Performed at [facility], [address] [place], [place] [address]    Culture METHICILLIN RESISTANT STAPHYLOCOCCUS AUREUS (A)  Final   Report Status 10/03/2024 FINAL  Final   Organism ID, Bacteria METHICILLIN RESISTANT STAPHYLOCOCCUS AUREUS  Final      Susceptibility   Methicillin resistant staphylococcus aureus - MIC*    CIPROFLOXACIN  >=8 RESISTANT Resistant     ERYTHROMYCIN  >=8 RESISTANT Resistant     GENTAMICIN <=0.5 SENSITIVE Sensitive     OXACILLIN >=4 RESISTANT Resistant     TETRACYCLINE <=1 SENSITIVE Sensitive      VANCOMYCIN  <=0.5 SENSITIVE Sensitive     TRIMETH /SULFA  <=10 SENSITIVE Sensitive     CLINDAMYCIN >=8 RESISTANT Resistant     RIFAMPIN  <=0.5 SENSITIVE Sensitive     Inducible Clindamycin NEGATIVE Sensitive     LINEZOLID  2 SENSITIVE Sensitive     * METHICILLIN RESISTANT STAPHYLOCOCCUS AUREUS  Culture, blood (Routine X 2) w Reflex to ID Panel     Status: None   Collection Time: 10/02/24  6:36 AM   Specimen: BLOOD  Result Value Ref Range Status   Specimen Description BLOOD BLOOD LEFT ARM  Final   Special Requests   Final    BOTTLES DRAWN AEROBIC AND ANAEROBIC Blood Culture adequate volume   Culture   Final    NO GROWTH 5 DAYS Performed at [facility], [address] [place], [place] [address]    Report Status 10/07/2024 FINAL  Final  Culture, blood (Routine X 2) w Reflex to ID Panel     Status: None   Collection Time: 10/02/24  6:36 AM   Specimen: BLOOD  Result Value Ref Range Status   Specimen Description BLOOD BLOOD LEFT ARM  Final   Special Requests   Final    BOTTLES DRAWN AEROBIC AND ANAEROBIC Blood Culture adequate volume   Culture   Final    NO GROWTH 5 DAYS Performed at [facility], [address] [place], [place] [address]    Report Status 10/07/2024 FINAL  Final    Coagulation Studies: No results for input(s): LABPROT, INR in the last 72 hours.   Urinalysis: No results for input(s): COLORURINE, LABSPEC, PHURINE, GLUCOSEU, HGBUR, BILIRUBINUR, KETONESUR, PROTEINUR, UROBILINOGEN, NITRITE, LEUKOCYTESUR in the last 72 hours.  Invalid input(s): APPERANCEUR     Imaging: DG Abd 1 View Result Date: 10/11/2024 EXAM: 1 VIEW XRAY OF THE ABDOMEN 10/11/2024  11:15:00 AM COMPARISON: 10/09/2024 CLINICAL HISTORY: [id number] Ileus [facility]) [id number] Ileus (HCC) [id number] Ileus (HCC) FINDINGS: BOWEL: Moderate distention of the stomach and gas-distended loops of bowel throughout the abdomen. Moderate stool burden. SOFT TISSUES: No abnormal  calcifications. BONES: Thoracic fusion hardware in place. No acute fracture. IMPRESSION: 1. Gas distended loops of bowel throughout the abdomen may represent ileus. Moderate colonic stool burden. Electronically signed by: Michaeline Blanch MD 10/11/2024 11:24 AM EST RP Workstation: [id number]   DG Chest Port 1 View Result Date: 10/11/2024 EXAM: 1 VIEW XRAY OF THE CHEST 10/11/2024 11:15:00 AM COMPARISON: 10/03/2024 CLINICAL HISTORY: Ileus (HCC) FINDINGS: LINES, TUBES AND DEVICES: Left IJ CVC in place with tip at the brachiocephalic vein/SVC confluence. LUNGS AND PLEURA: Low lung volumes. Diffuse interstitial opacities, likely pulmonary edema. Bibasilar pulmonary opacities. Moderate left and small right pleural effusions. No pneumothorax. HEART AND MEDIASTINUM: Similar cardiomegaly. No acute abnormality of the mediastinal silhouette. BONES AND SOFT TISSUES: Partially imaged thoracolumbar fusion hardware. No acute osseous abnormality. IMPRESSION: 1. Left IJ central venous catheter with tip at the confluence of the brachiocephalic veins. No pneumothorax. 2. Likely pulmonary edema. 3. Moderate left and small right pleural effusions with bibasilar opacity . Electronically signed by: Michaeline Blanch MD 10/11/2024 11:22 AM EST RP Workstation: [id number]     Medications:    anticoagulant sodium citrate      vancomycin       bisacodyl   10 mg Oral QHS   calcitRIOL   0.5 mcg Oral Daily   carvedilol   6.25 mg Oral BID WC   Chlorhexidine  Gluconate Cloth  6 each Topical Q0600   cyanocobalamin   1,000 mcg Oral Daily   diltiazem   240 mg Oral QPM   doxepin   10 mg Oral QHS   famotidine   10 mg Oral Daily   folic acid   1 mg Oral Daily   gabapentin   300 mg Oral QHS   heparin   5,000 Units Subcutaneous Q8H   insulin  aspart  0-5 Units Subcutaneous QHS   insulin  aspart  0-6 Units Subcutaneous TID WC   insulin  aspart  9 Units Subcutaneous TID WC   insulin  glargine  22 Units Subcutaneous BID   levothyroxine   175 mcg Oral QHS    lidocaine  (PF)  10 mL Other Once   linaclotide   145 mcg Oral QAC breakfast   loratadine   10 mg Oral Daily   nicotine   14 mg Transdermal Daily   pantoprazole   40 mg Oral Daily   polyethylene glycol  17 g Oral BID   senna-docusate  1 tablet Oral BID   simethicone   80 mg Oral QID   acetaminophen  **OR** acetaminophen , albuterol , anticoagulant sodium citrate , bisacodyl , guaiFENesin , liver oil-zinc  oxide, nicotine  polacrilex, ondansetron  **OR** ondansetron  (ZOFRAN ) IV, oxyCODONE   Assessment/ Plan:  Mr. Nijel Flink is a 64 y.o.  male with past medical conditions including diabetes, hepatitis C, bilateral BKA, anxiety and depression, and end-stage renal disease on hemodialysis.  Patient presents to the emergency department from his facility complaining of shortness of breath.  Patient is currently admitted for SIRS (systemic inflammatory response syndrome) (HCC) [R65.10]  [facility]/MWF/120.5 kg  End-stage renal disease on hemodialysis.        Due to infection, vascular removed right IJ PermCath on 1/8.  Received line free holiday. Vascular has placed lt internal jugular permcath.   Scheduled for dialysis later today.   Anemia of chronic kidney disease Lab Results  Component Value Date   HGB 8.6 (L) 10/10/2024   Hemoglobin 8.6, Will order low dose retacrit   with dialysis today  3.  Systemic inflammatory response syndrome, suspected due to fever, leukocytosis, and tachypnea on ED arrival.  Suspected source of suprapubic catheter. Abdominal x-ray shows moderate diffuse air distention of the bowel suspicious for ileus.  Blood culture positive for MRSA. Vascular removed PermCath on 1/8 and replaced on 1/12.  Continues to receive vancomycin , ordered per primary team.  ID consulted, recommends Vancomycin  1000mg  with dialysis until 11/11/24. Outpatient clinic notified  4. Secondary Hyperparathyroidism: with outpatient labs: None available  Lab Results  Component Value Date   PTH 177 (H)  04/23/2024   CALCIUM  8.4 (L) 10/10/2024   PHOS 5.3 (H) 10/10/2024   Bone minerals within optimal range.    LOS: 13 Baylen Buckner 1/19/202611:55 AM   "

## 2024-10-13 ENCOUNTER — Inpatient Hospital Stay

## 2024-10-13 LAB — BODY FLUID CELL COUNT WITH DIFFERENTIAL
Eos, Fluid: 0 %
Lymphs, Fluid: 75 %
Monocyte-Macrophage-Serous Fluid: 15 %
Neutrophil Count, Fluid: 10 %
Total Nucleated Cell Count, Fluid: 41 uL

## 2024-10-13 LAB — CBC
HCT: 25.6 % — ABNORMAL LOW (ref 39.0–52.0)
Hemoglobin: 8.1 g/dL — ABNORMAL LOW (ref 13.0–17.0)
MCH: 29.6 pg (ref 26.0–34.0)
MCHC: 31.6 g/dL (ref 30.0–36.0)
MCV: 93.4 fL (ref 80.0–100.0)
Platelets: 202 K/uL (ref 150–400)
RBC: 2.74 MIL/uL — ABNORMAL LOW (ref 4.22–5.81)
RDW: 13.9 % (ref 11.5–15.5)
WBC: 5.9 K/uL (ref 4.0–10.5)
nRBC: 0 % (ref 0.0–0.2)

## 2024-10-13 LAB — BASIC METABOLIC PANEL WITH GFR
Anion gap: 10 (ref 5–15)
BUN: 50 mg/dL — ABNORMAL HIGH (ref 8–23)
CO2: 25 mmol/L (ref 22–32)
Calcium: 8 mg/dL — ABNORMAL LOW (ref 8.9–10.3)
Chloride: 93 mmol/L — ABNORMAL LOW (ref 98–111)
Creatinine, Ser: 4.85 mg/dL — ABNORMAL HIGH (ref 0.61–1.24)
GFR, Estimated: 13 mL/min — ABNORMAL LOW
Glucose, Bld: 185 mg/dL — ABNORMAL HIGH (ref 70–99)
Potassium: 4.9 mmol/L (ref 3.5–5.1)
Sodium: 129 mmol/L — ABNORMAL LOW (ref 135–145)

## 2024-10-13 LAB — GLUCOSE, CAPILLARY
Glucose-Capillary: 164 mg/dL — ABNORMAL HIGH (ref 70–99)
Glucose-Capillary: 186 mg/dL — ABNORMAL HIGH (ref 70–99)
Glucose-Capillary: 133 mg/dL — ABNORMAL HIGH (ref 70–99)

## 2024-10-13 MED ORDER — BISACODYL 10 MG RE SUPP
10.0000 mg | Freq: Once | RECTAL | Status: AC
  Administered 2024-10-13: 10 mg via RECTAL
  Filled 2024-10-13: qty 1

## 2024-10-13 MED ORDER — MINERAL OIL RE ENEM
1.0000 | ENEMA | Freq: Once | RECTAL | Status: AC
  Administered 2024-10-13: 1 via RECTAL

## 2024-10-13 MED ORDER — LIDOCAINE HCL (PF) 1 % IJ SOLN
10.0000 mL | Freq: Once | INTRAMUSCULAR | Status: AC
  Administered 2024-10-13: 10 mL via INTRADERMAL

## 2024-10-13 MED ORDER — LACTULOSE 10 GM/15ML PO SOLN
10.0000 g | Freq: Once | ORAL | Status: AC
  Administered 2024-10-13: 10 g via ORAL
  Filled 2024-10-13: qty 30

## 2024-10-13 NOTE — Progress Notes (Signed)
 [facility] Progress Note  Patient: Eugene [name]    FMW:[id number]  DOA: 09/29/2024     Date of Service: the patient was seen and examined on 10/13/2024  Chief Complaint  Patient presents with   Shortness of Breath   Brief hospital course:  Eugene [name] is a 64 y.o. male with medical history significant of anxiety, depression, type 2 diabetes mellitus, history of hepatitis C, end-stage renal disease on dialysis, bilateral BKA.  Patient known to most historian.  States he is short of breath.  He is sick to his stomach.  He vomited.  No diarrhea.  He is short of breath.  He is having a fever.  In the ER he was found to have an elevated white blood cell count and started on sepsis protocol.  Respiratory panel pending, chest x-ray shows mild interstitial edema and left greater than right pleural effusion.  Urine looks positive and patient has a suprapubic catheter.  Hospitalist services contacted for further evaluation.      Assessment and Plan:  # Sepsis secondary to MRSA bacteremia Present on admission.  Sepsis criteria fever leukocytosis and tachycardia.  Now resolved.  MRSA positive in 4 out of 4 bottles. HD catheter removed 1/8 Afebrile overnight Repeat blood cultures no growth to date Continue vancomycin  -- Continue to follow blood cultures --CT of the chest given patient intermittently requiring 10 L of oxygen up from baseline of 6 L 1/13 CT Chest negative for PE, bilateral pleural effusion, mild to moderate.  Atelectasis -- Will likely need 6 weeks of antibiotics given likely cannot undergo TEE 1/14 cardiology consulted to eval for TEE.  Cardiology recommended no TEE, risk outweigh the benefit.  Advised to continue antibiotics for longer duration.  ID is aware. As per ID, continue vancomycin  1250mg  IV with HD on MWF End date: 11/11/2024   # Ileus Small and large bowel ileus 1/14 KUB: Diffusely gas-filled, although not overtly distended, small bowel and colon throughout  abdomen and pelvis. nonobstructive bowel gas pattern. 1/7 CT a/p: notable for proctocolitis but no findings to suggest etiology of his nausea.   --Continue antiemetics, aggressive bowel regimen --Continue zofran  prn 1/15 as per KUB patient may have ileus, discussed with patient regarding NG tube insertion and suction, he refused.  Advised to start liquid diet and avoid solids, remains at high risk for aspiration but he did not wanted liquid diet, wanted to continue regular diet.  He will consider NG tube suctioning if he gets sick or starts vomiting. Continue aspiration precautions. 1/16 KUB shows moderate stool burden, no abnormal bowel dilatation. Mineral oil enema order placed 1/17 patient was refusing MiraLAX , advised to take it regularly and patient agreed for Dulcolax suppository.  Followed by smog enema if no BM after suppository. 1/18 KUB shows moderate stool burden, soapsuds enema order placed 1/19 KUB: Moderate stool burden, decreased colonic distention Dulcolax suppository and mineral enema order placed, 1/20 KUB shows moderate stool burden.  Dulcolax suppository x 1 dose, followed by soapsuds enema x 1 dose and mineral oil enema at nighttime orders placed Follow-up KUB tomorrow a.m.  # Normocytic anemia # Thrombocytopenia improving Likely in settong of anemia of chronic disease and infection. H/o B12 deficiency Continue B12 supplementation, folate borderline, continue supplementation    # ESRD (end stage renal disease) (HCC) Hemodialysis per nephrology   # Paroxysmal SVT (supraventricular tachycardia) On Cardizem  and Coreg    # Heart failure with preserved ejection fraction [facility]) Dialysis to manage fluid  # Bilateral pleural effusion 1/19  IR consulted for thoracentesis, effusion Left >Right but could not drain, no safe window.  1/20 IR will try thoracentesis today    # Acute on chronic respiratory failure with hypoxia Patient oxygen level intermittently increased for  comfort, he maintained appropriate oxygen saturations on 6 L.  An ABG was obtained after placing patient on 6 L of oxygen for 3 hours and his O2 saturation was 96%.  Patient currently refusing CPAP machine. Incentive spirometry Maintain O2 saturations greater than 92%   # Asymptomatic bacteruria  Ucx with proteus mirabilis, and MRSA. MRSA due to MRSA bacteremia likely in setting of line infection.  Patient is on IV vancomycin . Follow ID for duration of treatment   # HTN Blood pressure remains slightly elevated. Discontinued midodrine  Continue Coreg  and Cardizem  Monitor BP and titrate medications accordingly    # Chronic pain On oxycodone    # Uncontrolled type 2 diabetes mellitus with hyperglycemia, with long-term current use of insulin  (HCC) A1c 7.3 though ESRD.  Remains poorly controlled will increase his Lantus  to 22 units twice daily and his mealtime insulin  to 9 units 3 times daily with meals.     # Hypothyroidism: On levothyroxine    # Vitamin D  deficiency: Started Calcitrol 0.5 mcg p.o. daily. follow with PCP to repeat vitamin D  level in between 3 to 6 months.  Obesity class II Body mass index is 39.7 kg/m.  Interventions: Calorie restricted diet advised to lose body weight.    Wound 09/29/24 1832 Pressure Injury Buttocks Right;Left Stage 2 -  Partial thickness loss of dermis presenting as a shallow open injury with a red, pink wound bed without slough. (Active)     Diet: Carb modified diet DVT Prophylaxis: Subcutaneous Heparin     Advance goals of care discussion: Full code  Family Communication: family was not present at bedside, at the time of interview.  The pt provided permission to discuss medical plan with the family. Opportunity was given to ask question and all questions were answered satisfactorily.   Disposition:  Pt is from SNF LTC, admitted with MRSA bacteremia, on IV antibiotics. Patient has severe constipation.  Patient is getting enema daily for the  past 3 days still has persistent constipation. Discharge back to LTC, when stable, most likely tomorrow a.m. if remains stable.  Follow TOC for dispo plan  Subjective: No significant events overnight.  Patient still has abdominal discomfort and did not move bowels much after the enema yesterday.  Patient agreed for Dulcolax suppository and enema today.  Denied any other complaints.   Physical Exam: General: NAD, lying comfortably Appear in no distress, affect appropriate Eyes: PERRLA ENT: Oral Mucosa Clear, moist  Neck: no JVD,  Cardiovascular: S1 and S2 Present, no Murmur,  Respiratory: good air entry bilaterally, decreased bibasilar breath sounds and bibasilar crackles, no significant wheezes Abdomen: BS present, Soft, obese, distended, mild generalized tenderness Skin: no rashes Extremities: s/p bilateral BKA  Neurologic: without any new focal findings Gait not checked due to patient safety concerns  Vitals:   10/12/24 2242 10/13/24 0108 10/13/24 0444 10/13/24 0939  BP:   138/71 134/73  Pulse:   67 70  Resp:   20   Temp:   98.2 F (36.8 C) 98.5 F (36.9 C)  TempSrc:    Oral  SpO2: 93% 94% 92% 94%  Weight:      Height:        Intake/Output Summary (Last 24 hours) at 10/13/2024 1419 Last data filed at 10/13/2024 0309 Gross per 24 hour  Intake 47.27 ml  Output 1200 ml  Net -1152.73 ml   Filed Weights   10/11/24 0602 10/12/24 1405 10/12/24 1700  Weight: 129.5 kg 126.7 kg 125.5 kg    Data Reviewed: I have personally reviewed and interpreted daily labs, tele strips, imagings as discussed above. I reviewed all nursing notes, pharmacy notes, vitals, pertinent old records I have discussed plan of care as described above with RN and patient/family.  CBC: Recent Labs  Lab 10/07/24 0449 10/08/24 0527 10/09/24 1606 10/10/24 0650 10/13/24 0509  WBC 7.0 6.1 7.2 6.0 5.9  NEUTROABS 4.1  --   --   --   --   HGB 8.7* 8.5* 8.3* 8.6* 8.1*  HCT 27.6* 27.2* 26.1* 26.4* 25.6*   MCV 94.2 93.8 93.2 93.6 93.4  PLT 143* 149* 198 200 202   Basic Metabolic Panel: Recent Labs  Lab 10/07/24 1430 10/08/24 0527 10/09/24 1606 10/10/24 0650 10/13/24 0509  NA 132* 132* 131* 130* 129*  K 4.7 4.3 4.3 4.7 4.9  CL 99 96* 95* 94* 93*  CO2 21* 25 25 24 25   GLUCOSE 189* 208* 109* 127* 185*  BUN 71* 43* 42* 51* 50*  CREATININE 6.45* 4.78* 4.83* 5.54* 4.85*  CALCIUM  7.7* 7.9* 8.2* 8.4* 8.0*  MG  --  1.9 2.1 1.9  --   PHOS 5.6* 4.2 4.1 5.3*  --     Studies: DG Abd 1 View Result Date: 10/13/2024 CLINICAL DATA:  Ileus. EXAM: ABDOMEN - 1 VIEW COMPARISON:  10/12/2024 FINDINGS: Bowel gas pattern is nonobstructive. No free peritoneal air. There is moderate fecal retention over the colon most prominent over the left colon and rectosigmoid colon. Remainder of the exam is unchanged. IMPRESSION: Nonobstructive bowel gas pattern with moderate fecal retention over the colon. Electronically Signed   By: Toribio Agreste M.D.   On: 10/13/2024 09:16     Scheduled Meds:  bisacodyl   10 mg Oral QHS   calcitRIOL   0.5 mcg Oral Daily   carvedilol   6.25 mg Oral BID WC   Chlorhexidine  Gluconate Cloth  6 each Topical Q0600   cyanocobalamin   1,000 mcg Oral Daily   diltiazem   240 mg Oral QPM   doxepin   10 mg Oral QHS   epoetin  alfa-epbx (RETACRIT ) injection  4,000 Units Intravenous Q M,W,F-1800   famotidine   10 mg Oral Daily   folic acid   1 mg Oral Daily   gabapentin   300 mg Oral QHS   heparin   5,000 Units Subcutaneous Q8H   insulin  aspart  0-5 Units Subcutaneous QHS   insulin  aspart  0-6 Units Subcutaneous TID WC   insulin  aspart  9 Units Subcutaneous TID WC   insulin  glargine  22 Units Subcutaneous BID   levothyroxine   175 mcg Oral QHS   lidocaine  (PF)  10 mL Other Once   linaclotide   145 mcg Oral QAC breakfast   loratadine   10 mg Oral Daily   mineral oil  1 enema Rectal Once   nicotine   14 mg Transdermal Daily   pantoprazole   40 mg Oral Daily   polyethylene glycol  17 g Oral BID    senna-docusate  1 tablet Oral BID   simethicone   80 mg Oral QID   Continuous Infusions:  anticoagulant sodium citrate      vancomycin  Stopped (10/12/24 1834)   PRN Meds: acetaminophen  **OR** acetaminophen , albuterol , anticoagulant sodium citrate , bisacodyl , guaiFENesin , liver oil-zinc  oxide, nicotine  polacrilex, ondansetron  **OR** ondansetron  (ZOFRAN ) IV, oxyCODONE   Time spent: 40 minutes  Author: ELVAN SOR. MD  [facility] 10/13/2024 2:19 PM  To reach On-call, see care teams to locate the attending and reach out to them via [email or web address]. If 7PM-7AM, please contact night-coverage If you still have difficulty reaching the attending provider, please page the [facility] (Director on Call) for [facility] on amion for assistance.

## 2024-10-13 NOTE — Progress Notes (Signed)
 PT Cancellation Note  Patient Details Name: Eugene [name] MRN: [id number] DOB: 1961-05-26   Cancelled Treatment:    Reason Eval/Treat Not Completed: Medical issues which prohibited therapy (Pt preparing for bowel prep on entry. WIll conitnue to follow and resume mobility once bowels are more predictablly continent.)  11:06 AM, 10/13/24 Peggye JAYSON Linear, PT, DPT Physical Therapist - [facility]  [phone] (ASCOM)    Roan Sawchuk C 10/13/2024, 11:06 AM

## 2024-10-13 NOTE — TOC Progression Note (Signed)
 Transition of Care [facility]) - Progression Note    Patient Details  Name: Eugene [name] MRN: [id number] Date of Birth: 1960-11-12  [facility]) CM/SW Contact  Dalia GORMAN Fuse, RN Phone Number: 10/13/2024, 9:14 AM  Clinical Narrative:    Remains on IV abx, KUB shows a moderate stool burden. Plan for mineral oil and suppository yesterday. Will dc back to LTC at [facility] when medically clear.                      Expected Discharge Plan and Services                                               Social Drivers of Health (SDOH) Interventions SDOH Screenings   Food Insecurity: No Food Insecurity (09/29/2024)  Housing: Low Risk (09/29/2024)  Transportation Needs: No Transportation Needs (09/29/2024)  Utilities: Not At Risk (09/29/2024)  Social Connections: Socially Isolated (09/29/2024)  Tobacco Use: Medium Risk (09/29/2024)    Readmission Risk Interventions    05/05/2024    3:05 PM  Readmission Risk Prevention Plan  Transportation Screening Complete  Medication Review (RN Care Manager) Complete  PCP or Specialist appointment within 3-5 days of discharge Complete  SW Recovery Care/Counseling Consult Complete  Palliative Care Screening Not Applicable  Skilled Nursing Facility Complete

## 2024-10-13 NOTE — Plan of Care (Signed)

## 2024-10-13 NOTE — Progress Notes (Signed)
 Patient refused SQ heparin  , hospitalist made aware, will continue to monitor.

## 2024-10-13 NOTE — Progress Notes (Signed)
 Patient refused to let this RN or the nurse tech check his blood sugar at lunch time. Patient also refused his 9 units of insulin  as a base coverage. Patient was educated on insulin  and voiced his understanding of the medication.

## 2024-10-13 NOTE — Plan of Care (Signed)
  Problem: Fluid Volume: Goal: Ability to maintain a balanced intake and output will improve Outcome: Progressing   Problem: Nutritional: Goal: Maintenance of adequate nutrition will improve Outcome: Progressing   Problem: Skin Integrity: Goal: Risk for impaired skin integrity will decrease Outcome: Progressing

## 2024-10-13 NOTE — Progress Notes (Signed)
 "  Date of Admission:  09/29/2024      ID: Eugene [name] is a 64 y.o. male  Principal Problem:   SIRS (systemic inflammatory response syndrome) (HCC) Active Problems:   Uncontrolled type 2 diabetes mellitus with hyperglycemia, with long-term current use of insulin  (HCC)   Chronic pain   Abdominal discomfort   Hypothyroidism   Paroxysmal SVT (supraventricular tachycardia)   ESRD (end stage renal disease) (HCC)   Chronic respiratory failure with hypoxia (HCC)   Chronic bilateral pleural effusions   Heart failure with preserved ejection fraction (HCC)   Chronic constipation   Obesity (BMI 30-39.9)   MRSA bacteremia    Subjective: Pt lying in bed nauseous  Underwent left thoracentesis today for SOB 200cc drained Xray abdomen moderate stool burden  Medications:   bisacodyl   10 mg Oral QHS   calcitRIOL   0.5 mcg Oral Daily   carvedilol   6.25 mg Oral BID WC   Chlorhexidine  Gluconate Cloth  6 each Topical Q0600   cyanocobalamin   1,000 mcg Oral Daily   diltiazem   240 mg Oral QPM   doxepin   10 mg Oral QHS   epoetin  alfa-epbx (RETACRIT ) injection  4,000 Units Intravenous Q M,W,F-1800   famotidine   10 mg Oral Daily   folic acid   1 mg Oral Daily   gabapentin   300 mg Oral QHS   heparin   5,000 Units Subcutaneous Q8H   insulin  aspart  0-5 Units Subcutaneous QHS   insulin  aspart  0-6 Units Subcutaneous TID WC   insulin  aspart  9 Units Subcutaneous TID WC   insulin  glargine  22 Units Subcutaneous BID   levothyroxine   175 mcg Oral QHS   lidocaine  (PF)  10 mL Other Once   linaclotide   145 mcg Oral QAC breakfast   loratadine   10 mg Oral Daily   mineral oil  1 enema Rectal Once   nicotine   14 mg Transdermal Daily   pantoprazole   40 mg Oral Daily   polyethylene glycol  17 g Oral BID   senna-docusate  1 tablet Oral BID   simethicone   80 mg Oral QID    Objective: Vital signs in last 24 hours: Patient Vitals for the past 24 hrs:  BP Temp Temp src Pulse Resp SpO2 Weight  10/13/24  1545 138/73 98.2 F (36.8 C) -- 73 17 94 % --  10/13/24 1427 124/71 -- -- 68 -- 93 % --  10/13/24 0939 134/73 98.5 F (36.9 C) Oral 70 -- 94 % --  10/13/24 0444 138/71 98.2 F (36.8 C) -- 67 20 92 % --  10/13/24 0108 -- -- -- -- -- 94 % --  10/12/24 2242 -- -- -- -- -- 93 % --  10/12/24 2030 128/75 98.4 F (36.9 C) Oral 72 16 (!) 89 % --  10/12/24 1930 -- -- -- -- -- 91 % --  10/12/24 1759 124/70 98.3 F (36.8 C) -- 68 16 93 % --  10/12/24 1710 -- -- -- 66 14 100 % --  10/12/24 1700 (!) 106/58 98.5 F (36.9 C) Oral 66 16 100 % 125.5 kg  10/12/24 1645 (!) 106/54 -- -- 65 17 100 % --  10/12/24 1630 (!) 93/53 -- -- 67 18 98 % --  10/12/24 1615 (!) 94/58 -- -- 61 18 91 % --  10/12/24 1600 97/63 -- -- 64 13 93 % --     PHYSICAL EXAM:  General:awake and alert- no distress, pale , chronically ill Lungs: b/l air entry- decreased bases. Heart:  Regular rate and rhythm, no murmur, rub or gallop. Abdomen: Soft, non-tender,not distended. Bowel sounds normal. No masses Extremities: b/l bka Skin: No rashes or lesions. Or bruising Neurologic: Grossly non-focal Left subclavian line Lab Results    Latest Ref Rng & Units 10/13/2024    5:09 AM 10/10/2024    6:50 AM 10/09/2024    4:06 PM  CBC  WBC 4.0 - 10.5 K/uL 5.9  6.0  7.2   Hemoglobin 13.0 - 17.0 g/dL 8.1  8.6  8.3   Hematocrit 39.0 - 52.0 % 25.6  26.4  26.1   Platelets 150 - 400 K/uL 202  200  198        Latest Ref Rng & Units 10/13/2024    5:09 AM 10/10/2024    6:50 AM 10/09/2024    4:06 PM  CMP  Glucose 70 - 99 mg/dL 814  872  890   BUN 8 - 23 mg/dL 50  51  42   Creatinine 0.61 - 1.24 mg/dL 5.14  4.45  5.16   Sodium 135 - 145 mmol/L 129  130  131   Potassium 3.5 - 5.1 mmol/L 4.9  4.7  4.3   Chloride 98 - 111 mmol/L 93  94  95   CO2 22 - 32 mmol/L 25  24  25    Calcium  8.9 - 10.3 mg/dL 8.0  8.4  8.2       Microbiology: 1/6 BC MRSA 1/8 cath tip MRSA 10/02/24 Bc NG 1/6 UC MRSA proteus Studies/Results: US  THORACENTESIS LEFT  ASP PLEURAL SPACE W/IMG GUIDE Result Date: 10/13/2024 INDICATION: Patient with a history of end-stage renal disease on hemodialysis and heart failure presents today with left pleural effusion. Interventional Radiology asked to perform a diagnostic and therapeutic thoracentesis. EXAM: ULTRASOUND GUIDED THORACENTESIS MEDICATIONS: 1% lidocaine  10 COMPLICATIONS: None immediate. PROCEDURE: An ultrasound guided thoracentesis was thoroughly discussed with the patient and questions answered. The benefits, risks, alternatives and complications were also discussed. The patient understands and wishes to proceed with the procedure. Written consent was obtained. Ultrasound was performed to localize and mark an adequate pocket of fluid in the left chest. The area was then prepped and draped in the normal sterile fashion. 1% Lidocaine  was used for local anesthesia. Under ultrasound guidance a 6 Fr Safe-T-Centesis catheter was introduced. Thoracentesis was performed. The catheter was removed and a dressing applied. FINDINGS: A total of approximately 200 mL of clear yellow fluid was removed. Samples were sent to the laboratory as requested by the clinical team. IMPRESSION: Successful ultrasound guided left thoracentesis yielding 200 mL of pleural fluid. Procedure performed by: Warren Dais, NP Electronically Signed   By: Juliene Balder M.D.   On: 10/13/2024 15:51   DG Chest Port 1 View Result Date: 10/13/2024 CLINICAL DATA:  Status post thoracentesis. EXAM: PORTABLE CHEST 1 VIEW COMPARISON:  Chest radiograph dated 10/11/2024. FINDINGS: Left IJ catheter in similar position. Cardiomegaly with vascular congestion and edema relatively similar to prior radiograph. Small bilateral pleural effusions similar to prior radiograph. No pneumothorax. No acute osseous pathology. Spinal hardware. IMPRESSION: No interval change.  No pneumothorax. Electronically Signed   By: Vanetta Chou M.D.   On: 10/13/2024 15:11   DG Abd 1 View Result  Date: 10/13/2024 CLINICAL DATA:  Ileus. EXAM: ABDOMEN - 1 VIEW COMPARISON:  10/12/2024 FINDINGS: Bowel gas pattern is nonobstructive. No free peritoneal air. There is moderate fecal retention over the colon most prominent over the left colon and rectosigmoid colon. Remainder of the exam is unchanged.  IMPRESSION: Nonobstructive bowel gas pattern with moderate fecal retention over the colon. Electronically Signed   By: Toribio Agreste M.D.   On: 10/13/2024 09:16   DG Abd 1 View Result Date: 10/12/2024 EXAM: 1 VIEW XRAY OF THE ABDOMEN 10/12/2024 11:01:00 AM COMPARISON: 10/11/2024 CLINICAL HISTORY: [id number] Ileus [facility]) [phone] Ileus (HCC) [phone] Ileus (HCC) 98749 Ileus (HCC) [id number] [phone] Ileus (HCC) [phone] FINDINGS: BOWEL: Interval decreased gaseous distention of the bowel. Moderate colonic stool burden. The bowel gas pattern is nonobstructive. SOFT TISSUES: Small right pleural effusion. Probable left effusion and adjacent parenchymal opacity. No abnormal calcifications. BONES: Thoracic spinal fixation hardware in place. No acute fracture. IMPRESSION: 1. Decreased gaseous distention of the bowel. 2. Moderate colonic stool burden. 3. Small right pleural effusion with probable left pleural effusion and adjacent parenchymal opacity. Electronically signed by: Dayne Hassell MD 10/12/2024 12:21 PM EST RP Workstation: [id number]   US  CHEST (PLEURAL EFFUSION) Result Date: 10/12/2024 EXAM: US  Chest Limited, Evaluate for Pleural Effusion. 10/12/2024 10:57:09 AM TECHNIQUE: Real-time ultrasound of the chest to evaluate for pleural effusion. COMPARISON: CT 10/06/2024. CLINICAL HISTORY: Pleural effusion on right. FINDINGS: LEFT PLEURAL SPACE: Small left pleural effusion. RIGHT PLEURAL SPACE: Small right pleural effusion with some adjacent atelectatic/consolidated pulmonary parenchyma. IMPRESSION: 1. Small right pleural effusion with adjacent atelectatic/consolidated pulmonary parenchyma. 2. Small left pleural effusion.  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 In October 2018 he had MRSA of the left foot which was  surgically debrided. And in November 2018 he again had MRSA and Proteus infection of the left foot and had debridement and was treated with IV antibiotics including vancomycin  and cefepime .   12/22/2018 had left BKA.  In May 2019 he had left septic knee arthritis and had MRSA which has had a surgical washout and treated with 4 weeks of vancomycin .    ESRD on Hemodialysis   DM  Neurogenic bladder- has a suprapubic catheter   Anemia   Hepatitis C - VL  9  million copies- needs treatment as OP   Hypothyroidism on synthroid    Constipation    OPAT orders   OPAT Orders Discharge antibiotics: Vancomycin  1000 mg   after dialysis M-W-F Per pharmacy protocol  Aim for Vancomycin  trough -20 (unless otherwise indicated) Duration: 6 weeks End Date: 11/11/24   Labs weekly while on IV antibiotics: _X_ CBC with differential   _X_ CMP _ _X_ Vancomycin  trough   _   Fax weekly lab results  promptly to [phone]   Clinic Follow Up Appt:11/03/24 at 9.30 Am -My chart Video visit     Call [phone] with questions or critical value  ID will sign off- call if needed  "

## 2024-10-13 NOTE — Progress Notes (Signed)
 " [facility]  ROUNDING NOTE   Subjective:   Eugene [name] is a 64 year old male with past medical conditions including diabetes, hepatitis C, bilateral BKA, anxiety and depression, and end-stage renal disease on hemodialysis.  Patient presents to the emergency department from his facility complaining of shortness of breath.  Patient is currently admitted for SIRS (systemic inflammatory response syndrome) (HCC) [R65.10]  Patient is known to our practice and receives outpatient dialysis treatments at [facility] on a MWF schedule, supervised by Dr. Dennise.    Update: Sitting up in bed States he doesn't feel well Unable to verbal specific complaints.  5L [place]   Objective:  Vital signs in last 24 hours:  Temp:  [98.1 F (36.7 C)-98.5 F (36.9 C)] 98.5 F (36.9 C) (01/20 0939) Pulse Rate:  [61-82] 70 (01/20 0939) Resp:  [11-20] 20 (01/20 0444) BP: (93-138)/(53-75) 134/73 (01/20 0939) SpO2:  [89 %-100 %] 94 % (01/20 0939) Weight:  [125.5 kg-126.7 kg] 125.5 kg (01/19 1700)  Weight change:  Filed Weights   10/11/24 0602 10/12/24 1405 10/12/24 1700  Weight: 129.5 kg 126.7 kg 125.5 kg    Intake/Output: I/O last 3 completed shifts: In: 47.3 [IV Piggyback:47.3] Out: 3225 [Urine:2025; Other:1200]   Intake/Output this shift:  No intake/output data recorded.  Physical Exam: General: NAD  Head: Normocephalic  Eyes: Anicteric  Lungs:  Wheeze, 5 L HFNC  Heart: Regular rate and rhythm  Abdomen:  Soft, nontender  Extremities: No edema.  Bilateral BKA  Neurologic: Awake, alert, conversant  Skin: Warm,dry, no rash  Access: Lt internal jugular permcath    Basic Metabolic Panel: Recent Labs  Lab 10/07/24 1430 10/08/24 0527 10/09/24 1606 10/10/24 0650 10/13/24 0509  NA 132* 132* 131* 130* 129*  K 4.7 4.3 4.3 4.7 4.9  CL 99 96* 95* 94* 93*  CO2 21* 25 25 24 25   GLUCOSE 189* 208* 109* 127* 185*  BUN 71* 43* 42* 51* 50*  CREATININE 6.45* 4.78* 4.83* 5.54* 4.85*   CALCIUM  7.7* 7.9* 8.2* 8.4* 8.0*  MG  --  1.9 2.1 1.9  --   PHOS 5.6* 4.2 4.1 5.3*  --     Liver Function Tests: Recent Labs  Lab 10/07/24 1430  ALBUMIN  2.5*   No results for input(s): LIPASE, AMYLASE in the last 168 hours.  No results for input(s): AMMONIA in the last 168 hours.  CBC: Recent Labs  Lab 10/07/24 0449 10/08/24 0527 10/09/24 1606 10/10/24 0650 10/13/24 0509  WBC 7.0 6.1 7.2 6.0 5.9  NEUTROABS 4.1  --   --   --   --   HGB 8.7* 8.5* 8.3* 8.6* 8.1*  HCT 27.6* 27.2* 26.1* 26.4* 25.6*  MCV 94.2 93.8 93.2 93.6 93.4  PLT 143* 149* 198 200 202    Cardiac Enzymes: No results for input(s): CKTOTAL, CKMB, CKMBINDEX, TROPONINI in the last 168 hours.  BNP: Invalid input(s): POCBNP  CBG: Recent Labs  Lab 10/12/24 1141 10/12/24 1315 10/12/24 1758 10/12/24 2144 10/13/24 0940  GLUCAP 70 98 175* 179* 164*    Microbiology: Results for orders placed or performed during the hospital encounter of 09/29/24  Resp panel by RT-PCR (RSV, Flu A&B, Covid) Anterior Nasal Swab     Status: None   Collection Time: 09/29/24  2:16 PM   Specimen: Anterior Nasal Swab  Result Value Ref Range Status   SARS Coronavirus 2 by RT PCR NEGATIVE NEGATIVE Final    Comment: (NOTE) SARS-CoV-2 target nucleic acids are NOT DETECTED.  The SARS-CoV-2  RNA is generally detectable in upper respiratory specimens during the acute phase of infection. The lowest concentration of SARS-CoV-2 viral copies this assay can detect is 138 copies/mL. A negative result does not preclude SARS-Cov-2 infection and should not be used as the sole basis for treatment or other patient management decisions. A negative result may occur with  improper specimen collection/handling, submission of specimen other than nasopharyngeal swab, presence of viral mutation(s) within the areas targeted by this assay, and inadequate number of viral copies(<138 copies/mL). A negative result must be combined  with clinical observations, patient history, and epidemiological information. The expected result is Negative.  Fact Sheet for Patients:  bloggercourse.com  Fact Sheet for Healthcare Providers:  seriousbroker.it  This test is no t yet approved or cleared by the [place]  FDA and  has been authorized for detection and/or diagnosis of SARS-CoV-2 by FDA under an Emergency Use Authorization (EUA). This EUA will remain  in effect (meaning this test can be used) for the duration of the COVID-19 declaration under Section 564(b)(1) of the Act, 21 U.S.C.section 360bbb-3(b)(1), unless the authorization is terminated  or revoked sooner.       Influenza A by PCR NEGATIVE NEGATIVE Final   Influenza B by PCR NEGATIVE NEGATIVE Final    Comment: (NOTE) The Xpert Xpress SARS-CoV-2/FLU/RSV plus assay is intended as an aid in the diagnosis of influenza from Nasopharyngeal swab specimens and should not be used as a sole basis for treatment. Nasal washings and aspirates are unacceptable for Xpert Xpress SARS-CoV-2/FLU/RSV testing.  Fact Sheet for Patients: bloggercourse.com  Fact Sheet for Healthcare Providers: seriousbroker.it  This test is not yet approved or cleared by the [place]  FDA and has been authorized for detection and/or diagnosis of SARS-CoV-2 by FDA under an Emergency Use Authorization (EUA). This EUA will remain in effect (meaning this test can be used) for the duration of the COVID-19 declaration under Section 564(b)(1) of the Act, 21 U.S.C. section 360bbb-3(b)(1), unless the authorization is terminated or revoked.     Resp Syncytial Virus by PCR NEGATIVE NEGATIVE Final    Comment: (NOTE) Fact Sheet for Patients: bloggercourse.com  Fact Sheet for Healthcare Providers: seriousbroker.it  This test is not yet approved  or cleared by the [place]  FDA and has been authorized for detection and/or diagnosis of SARS-CoV-2 by FDA under an Emergency Use Authorization (EUA). This EUA will remain in effect (meaning this test can be used) for the duration of the COVID-19 declaration under Section 564(b)(1) of the Act, 21 U.S.C. section 360bbb-3(b)(1), unless the authorization is terminated or revoked.  Performed at [facility], [address], [place], [place] [address]   Urine Culture     Status: Abnormal   Collection Time: 09/29/24  2:17 PM   Specimen: Urine, Random  Result Value Ref Range Status   Specimen Description   Final    URINE, RANDOM Performed at [facility], [address] [place], [place] [address]    Special Requests   Final    NONE Reflexed from [id number] Performed at [facility], [address] [place], [place] [address]    Culture (A)  Final    >=100,000 COLONIES/mL PROTEUS MIRABILIS >=100,000 COLONIES/mL METHICILLIN RESISTANT STAPHYLOCOCCUS AUREUS    Report Status 10/02/2024 FINAL  Final   Organism ID, Bacteria PROTEUS MIRABILIS (A)  Final   Organism ID, Bacteria METHICILLIN RESISTANT STAPHYLOCOCCUS AUREUS (A)  Final      Susceptibility   Methicillin resistant staphylococcus aureus - MIC*  CIPROFLOXACIN  >=8 RESISTANT Resistant     GENTAMICIN <=0.5 SENSITIVE Sensitive     NITROFURANTOIN <=16 SENSITIVE Sensitive     OXACILLIN >=4 RESISTANT Resistant     TETRACYCLINE <=1 SENSITIVE Sensitive     VANCOMYCIN  1 SENSITIVE Sensitive     TRIMETH /SULFA  <=10 SENSITIVE Sensitive     RIFAMPIN  <=0.5 SENSITIVE Sensitive     Inducible Clindamycin NEGATIVE Sensitive     LINEZOLID  2 SENSITIVE Sensitive     * >=100,000 COLONIES/mL METHICILLIN RESISTANT STAPHYLOCOCCUS AUREUS   Proteus mirabilis - MIC*    AMPICILLIN <=2 SENSITIVE Sensitive     CEFAZOLIN  (URINE) Value in next row Sensitive      4 SENSITIVEThis is a modified FDA-approved test that has been  validated and its performance characteristics determined by the reporting laboratory.  This laboratory is certified under the Clinical Laboratory Improvement Amendments CLIA as qualified to perform high complexity clinical laboratory testing.    CEFEPIME  Value in next row Sensitive      4 SENSITIVEThis is a modified FDA-approved test that has been validated and its performance characteristics determined by the reporting laboratory.  This laboratory is certified under the Clinical Laboratory Improvement Amendments CLIA as qualified to perform high complexity clinical laboratory testing.    ERTAPENEM Value in next row Sensitive      4 SENSITIVEThis is a modified FDA-approved test that has been validated and its performance characteristics determined by the reporting laboratory.  This laboratory is certified under the Clinical Laboratory Improvement Amendments CLIA as qualified to perform high complexity clinical laboratory testing.    CEFTRIAXONE  Value in next row Sensitive      4 SENSITIVEThis is a modified FDA-approved test that has been validated and its performance characteristics determined by the reporting laboratory.  This laboratory is certified under the Clinical Laboratory Improvement Amendments CLIA as qualified to perform high complexity clinical laboratory testing.    CIPROFLOXACIN  Value in next row Resistant      4 SENSITIVEThis is a modified FDA-approved test that has been validated and its performance characteristics determined by the reporting laboratory.  This laboratory is certified under the Clinical Laboratory Improvement Amendments CLIA as qualified to perform high complexity clinical laboratory testing.    GENTAMICIN Value in next row Sensitive      4 SENSITIVEThis is a modified FDA-approved test that has been validated and its performance characteristics determined by the reporting laboratory.  This laboratory is certified under the Clinical Laboratory Improvement Amendments CLIA as  qualified to perform high complexity clinical laboratory testing.    NITROFURANTOIN Value in next row Resistant      4 SENSITIVEThis is a modified FDA-approved test that has been validated and its performance characteristics determined by the reporting laboratory.  This laboratory is certified under the Clinical Laboratory Improvement Amendments CLIA as qualified to perform high complexity clinical laboratory testing.    TRIMETH /SULFA  Value in next row Sensitive      4 SENSITIVEThis is a modified FDA-approved test that has been validated and its performance characteristics determined by the reporting laboratory.  This laboratory is certified under the Clinical Laboratory Improvement Amendments CLIA as qualified to perform high complexity clinical laboratory testing.    AMPICILLIN/SULBACTAM Value in next row Sensitive      4 SENSITIVEThis is a modified FDA-approved test that has been validated and its performance characteristics determined by the reporting laboratory.  This laboratory is certified under the Clinical Laboratory Improvement Amendments CLIA as qualified to perform high complexity clinical laboratory testing.  PIP/TAZO Value in next row Sensitive      <=4 SENSITIVEThis is a modified FDA-approved test that has been validated and its performance characteristics determined by the reporting laboratory.  This laboratory is certified under the Clinical Laboratory Improvement Amendments CLIA as qualified to perform high complexity clinical laboratory testing.    MEROPENEM Value in next row Sensitive      <=4 SENSITIVEThis is a modified FDA-approved test that has been validated and its performance characteristics determined by the reporting laboratory.  This laboratory is certified under the Clinical Laboratory Improvement Amendments CLIA as qualified to perform high complexity clinical laboratory testing.    * >=100,000 COLONIES/mL PROTEUS MIRABILIS  Blood Culture (routine x 2)     Status:  Abnormal   Collection Time: 09/29/24  2:37 PM   Specimen: BLOOD  Result Value Ref Range Status   Specimen Description   Final    BLOOD BLOOD RIGHT FOREARM Performed at [facility], [address] [place], [place] [address]    Special Requests   Final    BOTTLES DRAWN AEROBIC AND ANAEROBIC Blood Culture results may not be optimal due to an inadequate volume of blood received in culture bottles Performed at [facility], [address] [place], [place] [address]    Culture  Setup Time   Final    GRAM POSITIVE COCCI IN BOTH AEROBIC AND ANAEROBIC BOTTLES CRITICAL VALUE NOTED.  VALUE IS CONSISTENT WITH PREVIOUSLY REPORTED AND CALLED VALUE.    Culture (A)  Final    STAPHYLOCOCCUS AUREUS SUSCEPTIBILITIES PERFORMED ON PREVIOUS CULTURE WITHIN THE LAST 5 DAYS. Performed at [facility], [address] [place], [place] [address]    Report Status 10/02/2024 FINAL  Final  Blood Culture (routine x 2)     Status: Abnormal   Collection Time: 09/29/24  2:37 PM   Specimen: BLOOD  Result Value Ref Range Status   Specimen Description   Final    BLOOD LEFT ANTECUBITAL Performed at [facility], [address] [place], [place] [address]    Special Requests   Final    BOTTLES DRAWN AEROBIC AND ANAEROBIC Blood Culture results may not be optimal due to an inadequate volume of blood received in culture bottles Performed at [facility], [address] [place], [place] [address]    Culture  Setup Time   Final    GRAM POSITIVE COCCI IN BOTH AEROBIC AND ANAEROBIC BOTTLES CRITICAL RESULT CALLED TO, READ BACK BY AND VERIFIED WITH: PHARMD NATHAN B ON [id number] @0457  BY HNM    Culture (A)  Final    METHICILLIN RESISTANT STAPHYLOCOCCUS AUREUS SEE SEPARATE REPORT Performed at [facility], [address], [place], [place] [address]    Report Status 10/12/2024 FINAL  Final   Organism ID, Bacteria METHICILLIN RESISTANT STAPHYLOCOCCUS AUREUS  Final       Susceptibility   Methicillin resistant staphylococcus aureus - MIC*    CIPROFLOXACIN  >=8 RESISTANT Resistant     ERYTHROMYCIN  >=8 RESISTANT Resistant     GENTAMICIN <=0.5 SENSITIVE Sensitive     OXACILLIN >=4 RESISTANT Resistant     TETRACYCLINE <=1 SENSITIVE Sensitive     VANCOMYCIN  1 SENSITIVE Sensitive     TRIMETH /SULFA  <=10 SENSITIVE Sensitive     CLINDAMYCIN >=8 RESISTANT Resistant     RIFAMPIN  <=0.5 SENSITIVE Sensitive     Inducible Clindamycin NEGATIVE Sensitive     LINEZOLID  2 SENSITIVE Sensitive     * METHICILLIN RESISTANT STAPHYLOCOCCUS AUREUS  Blood Culture ID Panel (Reflexed)     Status: Abnormal   Collection Time: 09/29/24  2:37 PM  Result Value Ref Range Status   Enterococcus faecalis NOT DETECTED NOT DETECTED Final   Enterococcus Faecium NOT DETECTED NOT DETECTED Final   Listeria monocytogenes NOT DETECTED NOT DETECTED Final   Staphylococcus species DETECTED (A) NOT DETECTED Final    Comment: CRITICAL RESULT CALLED TO, READ BACK BY AND VERIFIED WITHBETHA RANKIN DILLS PHARMD [id number] 09/30/24 HNM    Staphylococcus aureus (BCID) DETECTED (A) NOT DETECTED Final    Comment: Methicillin (oxacillin)-resistant Staphylococcus aureus (MRSA). MRSA is predictably resistant to beta-lactam antibiotics (except ceftaroline). Preferred therapy is vancomycin  unless clinically contraindicated. Patient requires contact precautions if  hospitalized. CRITICAL RESULT CALLED TO, READ BACK BY AND VERIFIED WITH: RANKIN DILLS PHARMD [id number] 09/30/24 HNM    Staphylococcus epidermidis NOT DETECTED NOT DETECTED Final   Staphylococcus lugdunensis NOT DETECTED NOT DETECTED Final   Streptococcus species NOT DETECTED NOT DETECTED Final   Streptococcus agalactiae NOT DETECTED NOT DETECTED Final   Streptococcus pneumoniae NOT DETECTED NOT DETECTED Final   Streptococcus pyogenes NOT DETECTED NOT DETECTED Final   A.calcoaceticus-baumannii NOT DETECTED NOT DETECTED Final   Bacteroides fragilis NOT DETECTED NOT  DETECTED Final   Enterobacterales NOT DETECTED NOT DETECTED Final   Enterobacter cloacae complex NOT DETECTED NOT DETECTED Final   Escherichia coli NOT DETECTED NOT DETECTED Final   Klebsiella aerogenes NOT DETECTED NOT DETECTED Final   Klebsiella oxytoca NOT DETECTED NOT DETECTED Final   Klebsiella pneumoniae NOT DETECTED NOT DETECTED Final   Proteus species NOT DETECTED NOT DETECTED Final   Salmonella species NOT DETECTED NOT DETECTED Final   Serratia marcescens NOT DETECTED NOT DETECTED Final   Haemophilus influenzae NOT DETECTED NOT DETECTED Final   Neisseria meningitidis NOT DETECTED NOT DETECTED Final   Pseudomonas aeruginosa NOT DETECTED NOT DETECTED Final   Stenotrophomonas maltophilia NOT DETECTED NOT DETECTED Final   Candida albicans NOT DETECTED NOT DETECTED Final   Candida auris NOT DETECTED NOT DETECTED Final   Candida glabrata NOT DETECTED NOT DETECTED Final   Candida krusei NOT DETECTED NOT DETECTED Final   Candida parapsilosis NOT DETECTED NOT DETECTED Final   Candida tropicalis NOT DETECTED NOT DETECTED Final   Cryptococcus neoformans/gattii NOT DETECTED NOT DETECTED Final   Meth resistant mecA/C and MREJ DETECTED (A) NOT DETECTED Final    Comment: CRITICAL RESULT CALLED TO, READ BACK BY AND VERIFIED WITHBETHA RANKIN DILLS PHARMD 9542 09/30/24 HNM Performed at [facility], [address] [place], [place] [address]   MIC (1 Drug)-     Status: Abnormal   Collection Time: 09/29/24  2:37 PM  Result Value Ref Range Status   Min Inhibitory Conc (1 Drug) Final report (A)  Corrected    Comment: (NOTE) Performed At: [facility] [employer] [address] [place], [place] [id number] Jennette Shorter MD Ey:[phone] CORRECTED ON 01/16 AT 0636: PREVIOUSLY REPORTED AS Preliminary report    Source BLOOD  Final    Comment: Performed at [facility], [address] [place], [place] [address]  MIC Result     Status: Abnormal   Collection Time: 09/29/24  2:37 PM   Result Value Ref Range Status   Result 1 (MIC) Comment (A)  Final    Comment: (NOTE) Methicillin - resistant Staphylococcus aureus Identification performed by account, not confirmed by this laboratory. DAPTOMYCIN     <=1 UG/ML = SUSCEPTIBLE Performed At: [facility] [address] [place], [place] [id number] Nagendra  Frankey MD Ey:[phone]   MRSA Next Gen by PCR, Nasal     Status: Abnormal   Collection Time: 09/30/24  5:00 PM   Specimen: Nasal Mucosa; Nasal Swab  Result Value Ref Range Status   MRSA by PCR Next Gen DETECTED (A) NOT DETECTED Final    Comment: RESULT CALLED TO, READ BACK BY AND VERIFIED WITH: TAMMY ORLANDO 2032 09/30/24 MU (NOTE) The GeneXpert MRSA Assay (FDA approved for NASAL specimens only), is one component of a comprehensive MRSA colonization surveillance program. It is not intended to diagnose MRSA infection nor to guide or monitor treatment for MRSA infections. Test performance is not FDA approved in patients less than 36 years old. Performed at [facility], [address] [place], [place] [address]   Cath Tip Culture     Status: Abnormal   Collection Time: 10/01/24 11:03 AM   Specimen: Catheter Tip; Other  Result Value Ref Range Status   Specimen Description   Final    CATH TIP Performed at [facility], [address] [place], [place] [address]    Special Requests   Final    NONE Performed at [facility], [address] [place], [place] [address]    Culture METHICILLIN RESISTANT STAPHYLOCOCCUS AUREUS (A)  Final   Report Status 10/03/2024 FINAL  Final   Organism ID, Bacteria METHICILLIN RESISTANT STAPHYLOCOCCUS AUREUS  Final      Susceptibility   Methicillin resistant staphylococcus aureus - MIC*    CIPROFLOXACIN  >=8 RESISTANT Resistant     ERYTHROMYCIN  >=8 RESISTANT Resistant     GENTAMICIN <=0.5 SENSITIVE Sensitive     OXACILLIN >=4 RESISTANT Resistant     TETRACYCLINE <=1 SENSITIVE Sensitive      VANCOMYCIN  <=0.5 SENSITIVE Sensitive     TRIMETH /SULFA  <=10 SENSITIVE Sensitive     CLINDAMYCIN >=8 RESISTANT Resistant     RIFAMPIN  <=0.5 SENSITIVE Sensitive     Inducible Clindamycin NEGATIVE Sensitive     LINEZOLID  2 SENSITIVE Sensitive     * METHICILLIN RESISTANT STAPHYLOCOCCUS AUREUS  Culture, blood (Routine X 2) w Reflex to ID Panel     Status: None   Collection Time: 10/02/24  6:36 AM   Specimen: BLOOD  Result Value Ref Range Status   Specimen Description BLOOD BLOOD LEFT ARM  Final   Special Requests   Final    BOTTLES DRAWN AEROBIC AND ANAEROBIC Blood Culture adequate volume   Culture   Final    NO GROWTH 5 DAYS Performed at [facility], [address] [place], [place] [address]    Report Status 10/07/2024 FINAL  Final  Culture, blood (Routine X 2) w Reflex to ID Panel     Status: None   Collection Time: 10/02/24  6:36 AM   Specimen: BLOOD  Result Value Ref Range Status   Specimen Description BLOOD BLOOD LEFT ARM  Final   Special Requests   Final    BOTTLES DRAWN AEROBIC AND ANAEROBIC Blood Culture adequate volume   Culture   Final    NO GROWTH 5 DAYS Performed at [facility], [address] [place], [place] [address]    Report Status 10/07/2024 FINAL  Final    Coagulation Studies: No results for input(s): LABPROT, INR in the last 72 hours.   Urinalysis: No results for input(s): COLORURINE, LABSPEC, PHURINE, GLUCOSEU, HGBUR, BILIRUBINUR, KETONESUR, PROTEINUR, UROBILINOGEN, NITRITE, LEUKOCYTESUR in the last 72 hours.  Invalid input(s): APPERANCEUR     Imaging: DG Abd 1 View Result Date:  10/13/2024 CLINICAL DATA:  Ileus. EXAM: ABDOMEN - 1 VIEW COMPARISON:  10/12/2024 FINDINGS: Bowel gas pattern is nonobstructive. No free peritoneal air. There is moderate fecal retention over the colon most prominent over the left colon and rectosigmoid colon. Remainder of the exam is unchanged. IMPRESSION: Nonobstructive bowel  gas pattern with moderate fecal retention over the colon. Electronically Signed   By: Toribio Agreste M.D.   On: 10/13/2024 09:16   DG Abd 1 View Result Date: 10/12/2024 EXAM: 1 VIEW XRAY OF THE ABDOMEN 10/12/2024 11:01:00 AM COMPARISON: 10/11/2024 CLINICAL HISTORY: [id number] Ileus [facility]) [id number] Ileus (HCC) [id number] [phone] Ileus (HCC) [id number] Ileus (HCC) [id number] Ileus (HCC) [phone] FINDINGS: BOWEL: Interval decreased gaseous distention of the bowel. Moderate colonic stool burden. The bowel gas pattern is nonobstructive. SOFT TISSUES: Small right pleural effusion. Probable left effusion and adjacent parenchymal opacity. No abnormal calcifications. BONES: Thoracic spinal fixation hardware in place. No acute fracture. IMPRESSION: 1. Decreased gaseous distention of the bowel. 2. Moderate colonic stool burden. 3. Small right pleural effusion with probable left pleural effusion and adjacent parenchymal opacity. Electronically signed by: Dayne Hassell MD 10/12/2024 12:21 PM EST RP Workstation: [id number]   US  CHEST (PLEURAL EFFUSION) Result Date: 10/12/2024 EXAM: US  Chest Limited, Evaluate for Pleural Effusion. 10/12/2024 10:57:09 AM TECHNIQUE: Real-time ultrasound of the chest to evaluate for pleural effusion. COMPARISON: CT 10/06/2024. CLINICAL HISTORY: Pleural effusion on right. FINDINGS: LEFT PLEURAL SPACE: Small left pleural effusion. RIGHT PLEURAL SPACE: Small right pleural effusion with some adjacent atelectatic/consolidated pulmonary parenchyma. IMPRESSION: 1. Small right pleural effusion with adjacent atelectatic/consolidated pulmonary parenchyma. 2. Small left pleural effusion. Electronically signed by: Katheleen Faes MD 10/12/2024 12:19 PM EST RP Workstation: [id number]   DG Abd 1 View Result Date: 10/11/2024 EXAM: 1 VIEW XRAY OF THE ABDOMEN 10/11/2024 11:15:00 AM COMPARISON: 10/09/2024 CLINICAL HISTORY: 01250 Ileus (HCC) [id number] Ileus (HCC) [id number] Ileus (HCC) FINDINGS: BOWEL: Moderate distention of the  stomach and gas-distended loops of bowel throughout the abdomen. Moderate stool burden. SOFT TISSUES: No abnormal calcifications. BONES: Thoracic fusion hardware in place. No acute fracture. IMPRESSION: 1. Gas distended loops of bowel throughout the abdomen may represent ileus. Moderate colonic stool burden. Electronically signed by: Michaeline Blanch MD 10/11/2024 11:24 AM EST RP Workstation: [id number]   DG Chest Port 1 View Result Date: 10/11/2024 EXAM: 1 VIEW XRAY OF THE CHEST 10/11/2024 11:15:00 AM COMPARISON: 10/03/2024 CLINICAL HISTORY: Ileus (HCC) FINDINGS: LINES, TUBES AND DEVICES: Left IJ CVC in place with tip at the brachiocephalic vein/SVC confluence. LUNGS AND PLEURA: Low lung volumes. Diffuse interstitial opacities, likely pulmonary edema. Bibasilar pulmonary opacities. Moderate left and small right pleural effusions. No pneumothorax. HEART AND MEDIASTINUM: Similar cardiomegaly. No acute abnormality of the mediastinal silhouette. BONES AND SOFT TISSUES: Partially imaged thoracolumbar fusion hardware. No acute osseous abnormality. IMPRESSION: 1. Left IJ central venous catheter with tip at the confluence of the brachiocephalic veins. No pneumothorax. 2. Likely pulmonary edema. 3. Moderate left and small right pleural effusions with bibasilar opacity . Electronically signed by: Michaeline Blanch MD 10/11/2024 11:22 AM EST RP Workstation: [id number]     Medications:    anticoagulant sodium citrate      vancomycin  Stopped (10/12/24 1834)    bisacodyl   10 mg Oral QHS   calcitRIOL   0.5 mcg Oral Daily   carvedilol   6.25 mg Oral BID WC   Chlorhexidine  Gluconate Cloth  6 each Topical Q0600   cyanocobalamin   1,000 mcg Oral Daily   diltiazem   240 mg Oral QPM   doxepin   10 mg Oral QHS   epoetin  alfa-epbx (RETACRIT ) injection  4,000 Units Intravenous Q M,W,F-1800   famotidine   10 mg Oral Daily   folic acid   1 mg Oral Daily   gabapentin   300 mg Oral QHS   heparin   5,000 Units Subcutaneous Q8H   insulin   aspart  0-5 Units Subcutaneous QHS   insulin  aspart  0-6 Units Subcutaneous TID WC   insulin  aspart  9 Units Subcutaneous TID WC   insulin  glargine  22 Units Subcutaneous BID   levothyroxine   175 mcg Oral QHS   lidocaine  (PF)  10 mL Other Once   linaclotide   145 mcg Oral QAC breakfast   loratadine   10 mg Oral Daily   nicotine   14 mg Transdermal Daily   pantoprazole   40 mg Oral Daily   polyethylene glycol  17 g Oral BID   senna-docusate  1 tablet Oral BID   simethicone   80 mg Oral QID   acetaminophen  **OR** acetaminophen , albuterol , anticoagulant sodium citrate , bisacodyl , guaiFENesin , liver oil-zinc  oxide, nicotine  polacrilex, ondansetron  **OR** ondansetron  (ZOFRAN ) IV, oxyCODONE   Assessment/ Plan:  [name] is a 64 y.o.  male with past medical conditions including diabetes, hepatitis C, bilateral BKA, anxiety and depression, and end-stage renal disease on hemodialysis.  Patient presents to the emergency department from his facility complaining of shortness of breath.  Patient is currently admitted for SIRS (systemic inflammatory response syndrome) (HCC) [R65.10]  [facility]/MWF/120.5 kg  End-stage renal disease on hemodialysis.        Due to infection, vascular removed right IJ PermCath on 1/8.  Received line free holiday. Vascular has placed lt internal jugular permcath.   Received dialysis yesterday, UF 1.2L achieved. Next treatment scheduled for Wednesday.   Anemia of chronic kidney disease Lab Results  Component Value Date   HGB 8.1 (L) 10/13/2024   Hemoglobin 8.1, Continue low dose retacrit  with dialysis  3.  Systemic inflammatory response syndrome, suspected due to fever, leukocytosis, and tachypnea on ED arrival.  Suspected source of suprapubic catheter. Abdominal x-ray shows moderate diffuse air distention of the bowel suspicious for ileus.  Blood culture positive for MRSA. Vascular removed PermCath on 1/8 and replaced on 1/12.  Continues to receive  vancomycin , ordered per primary team.  ID consulted, recommends Vancomycin  1000mg  with dialysis until 11/11/24. Outpatient clinic notified  4. Secondary Hyperparathyroidism: with outpatient labs: None available  Lab Results  Component Value Date   PTH 177 (H) 04/23/2024   CALCIUM  8.0 (L) 10/13/2024   PHOS 5.3 (H) 10/10/2024   Calcium  and phos within optimal range.    LOS: 14 Eugene [name] 1/20/202611:06 AM   "

## 2024-10-13 NOTE — Progress Notes (Signed)
 OT Cancellation Note  Patient Details Name: Eugene [name] MRN: [id number] DOB: [birth year]   Cancelled Treatment:    Reason Eval/Treat Not Completed: Patient at procedure or test/ unavailable. Pt out for thoracentesis. Will re-attempt OT tx at later date/time as pt is available.   Kaiyden Simkin R., MPH, MS, OTR/L ascom [phone] 10/13/24, 2:35 PM

## 2024-10-13 NOTE — Procedures (Signed)
 PROCEDURE SUMMARY:  Successful US  guided left thoracentesis. Yielded 200 ml of clear yellow fluid. Pt tolerated procedure well. No immediate complications.  Specimen sent for labs. CXR ordered; no post-procedure pneumothorax identified.  EBL < 2 mL  Warren JONELLE Dais, NP 10/13/2024 3:39 PM

## 2024-10-14 ENCOUNTER — Inpatient Hospital Stay

## 2024-10-14 DIAGNOSIS — E1165 Type 2 diabetes mellitus with hyperglycemia: Secondary | ICD-10-CM | POA: Diagnosis not present

## 2024-10-14 DIAGNOSIS — R6510 Systemic inflammatory response syndrome (SIRS) of non-infectious origin without acute organ dysfunction: Secondary | ICD-10-CM | POA: Diagnosis not present

## 2024-10-14 DIAGNOSIS — R7881 Bacteremia: Secondary | ICD-10-CM | POA: Diagnosis not present

## 2024-10-14 DIAGNOSIS — B9562 Methicillin resistant Staphylococcus aureus infection as the cause of diseases classified elsewhere: Secondary | ICD-10-CM | POA: Diagnosis not present

## 2024-10-14 DIAGNOSIS — I471 Supraventricular tachycardia, unspecified: Secondary | ICD-10-CM | POA: Diagnosis not present

## 2024-10-14 DIAGNOSIS — Z794 Long term (current) use of insulin: Secondary | ICD-10-CM | POA: Diagnosis not present

## 2024-10-14 DIAGNOSIS — N186 End stage renal disease: Secondary | ICD-10-CM | POA: Diagnosis not present

## 2024-10-14 LAB — RENAL FUNCTION PANEL
Albumin: 2.7 g/dL — ABNORMAL LOW (ref 3.5–5.0)
Anion gap: 11 (ref 5–15)
BUN: 57 mg/dL — ABNORMAL HIGH (ref 8–23)
CO2: 24 mmol/L (ref 22–32)
Calcium: 7.9 mg/dL — ABNORMAL LOW (ref 8.9–10.3)
Chloride: 93 mmol/L — ABNORMAL LOW (ref 98–111)
Creatinine, Ser: 5.08 mg/dL — ABNORMAL HIGH (ref 0.61–1.24)
GFR, Estimated: 12 mL/min — ABNORMAL LOW
Glucose, Bld: 144 mg/dL — ABNORMAL HIGH (ref 70–99)
Phosphorus: 9.7 mg/dL — ABNORMAL HIGH (ref 2.5–4.6)
Potassium: 4.8 mmol/L (ref 3.5–5.1)
Sodium: 128 mmol/L — ABNORMAL LOW (ref 135–145)

## 2024-10-14 LAB — GLUCOSE, CAPILLARY
Glucose-Capillary: 231 mg/dL — ABNORMAL HIGH (ref 70–99)
Glucose-Capillary: 181 mg/dL — ABNORMAL HIGH (ref 70–99)

## 2024-10-14 LAB — CBC
HCT: 24.4 % — ABNORMAL LOW (ref 39.0–52.0)
Hemoglobin: 7.9 g/dL — ABNORMAL LOW (ref 13.0–17.0)
MCH: 29.9 pg (ref 26.0–34.0)
MCHC: 32.4 g/dL (ref 30.0–36.0)
MCV: 92.4 fL (ref 80.0–100.0)
Platelets: 205 K/uL (ref 150–400)
RBC: 2.64 MIL/uL — ABNORMAL LOW (ref 4.22–5.81)
RDW: 14.1 % (ref 11.5–15.5)
WBC: 6.8 K/uL (ref 4.0–10.5)
nRBC: 0 % (ref 0.0–0.2)

## 2024-10-14 LAB — PATHOLOGIST SMEAR REVIEW

## 2024-10-14 MED ORDER — ALTEPLASE 2 MG IJ SOLR
2.0000 mg | Freq: Once | INTRAMUSCULAR | Status: AC | PRN
  Administered 2024-10-14: 2 mg

## 2024-10-14 MED ORDER — ALTEPLASE 2 MG IJ SOLR
INTRAMUSCULAR | Status: AC
  Filled 2024-10-14: qty 4

## 2024-10-14 MED ORDER — STERILE WATER FOR INJECTION IJ SOLN
INTRAMUSCULAR | Status: AC
  Filled 2024-10-14: qty 10

## 2024-10-14 MED ORDER — HEPARIN SODIUM (PORCINE) 1000 UNIT/ML IJ SOLN
INTRAMUSCULAR | Status: AC
  Filled 2024-10-14: qty 4

## 2024-10-14 MED ORDER — HEPARIN SODIUM (PORCINE) 1000 UNIT/ML DIALYSIS: 1000.0000 [IU] | INTRAMUSCULAR | Status: DC | PRN

## 2024-10-14 MED ORDER — EPOETIN ALFA-EPBX 4000 UNIT/ML IJ SOLN
INTRAMUSCULAR | Status: AC
  Filled 2024-10-14: qty 1

## 2024-10-14 MED ORDER — IPRATROPIUM-ALBUTEROL 0.5-2.5 (3) MG/3ML IN SOLN
3.0000 mL | Freq: Two times a day (BID) | RESPIRATORY_TRACT | Status: DC
  Administered 2024-10-14 – 2024-10-15 (×2): 3 mL via RESPIRATORY_TRACT
  Filled 2024-10-14 (×2): qty 3

## 2024-10-14 MED ORDER — OXYCODONE HCL 15 MG PO TABS: 7.5000 mg | ORAL_TABLET | Freq: Four times a day (QID) | ORAL | 0 refills | Status: AC | PRN

## 2024-10-14 MED ORDER — CALCITRIOL 0.5 MCG PO CAPS: 0.5000 ug | ORAL_CAPSULE | Freq: Every day | ORAL | 1 refills | Status: AC

## 2024-10-14 MED ORDER — BISACODYL 5 MG PO TBEC: 10.0000 mg | DELAYED_RELEASE_TABLET | Freq: Every day | ORAL | 0 refills | Status: AC

## 2024-10-14 MED ORDER — LACTULOSE 20 GM/30ML PO SOLN: 30.0000 mL | Freq: Two times a day (BID) | ORAL | 0 refills | Status: AC

## 2024-10-14 MED ORDER — VANCOMYCIN IV (FOR PTA / DISCHARGE USE ONLY): 1000.0000 mg | INTRAVENOUS | Status: AC

## 2024-10-14 MED ORDER — INSULIN GLARGINE-YFGN 100 UNIT/ML ~~LOC~~ SOLN: 20.0000 [IU] | Freq: Two times a day (BID) | SUBCUTANEOUS | 0 refills | Status: DC

## 2024-10-14 NOTE — Progress Notes (Signed)
 " [facility]  ROUNDING NOTE   Subjective:   Eugene [name] is a 64 year old male with past medical conditions including diabetes, hepatitis C, bilateral BKA, anxiety and depression, and end-stage renal disease on hemodialysis.  Patient presents to the emergency department from his facility complaining of shortness of breath.  Patient is currently admitted for SIRS (systemic inflammatory response syndrome) (HCC) [R65.10]  Patient is known to our practice and receives outpatient dialysis treatments at [facility] on a MWF schedule, supervised by Dr. Dennise.    Update: Patient sitting up in bed Complains of discomfort, positional  Remains on 7L [place]   Objective:  Vital signs in last 24 hours:  Temp:  [98 F (36.7 C)-98.8 F (37.1 C)] 98.6 F (37 C) (01/21 0828) Pulse Rate:  [68-73] 68 (01/21 0828) Resp:  [15-18] 15 (01/21 0536) BP: (114-138)/(51-73) 114/51 (01/21 0828) SpO2:  [89 %-98 %] 93 % (01/21 1053) FiO2 (%):  [90 %] 90 % (01/20 1856)  Weight change:  Filed Weights   10/11/24 0602 10/12/24 1405 10/12/24 1700  Weight: 129.5 kg 126.7 kg 125.5 kg    Intake/Output: I/O last 3 completed shifts: In: 47.3 [IV Piggyback:47.3] Out: 875 [Urine:875]   Intake/Output this shift:  Total I/O In: 480 [P.O.:480] Out: -   Physical Exam: General: NAD  Head: Normocephalic  Eyes: Anicteric  Lungs:  Wheeze, 7 L HFNC  Heart: Regular rate and rhythm  Abdomen:  Soft, nontender  Extremities: No edema.  Bilateral BKA  Neurologic: Awake, alert, conversant  Skin: Warm,dry, no rash  Access: Lt internal jugular permcath    Basic Metabolic Panel: Recent Labs  Lab 10/07/24 1430 10/08/24 0527 10/09/24 1606 10/10/24 0650 10/13/24 0509  NA 132* 132* 131* 130* 129*  K 4.7 4.3 4.3 4.7 4.9  CL 99 96* 95* 94* 93*  CO2 21* 25 25 24 25   GLUCOSE 189* 208* 109* 127* 185*  BUN 71* 43* 42* 51* 50*  CREATININE 6.45* 4.78* 4.83* 5.54* 4.85*  CALCIUM  7.7* 7.9* 8.2* 8.4* 8.0*   MG  --  1.9 2.1 1.9  --   PHOS 5.6* 4.2 4.1 5.3*  --     Liver Function Tests: Recent Labs  Lab 10/07/24 1430  ALBUMIN  2.5*   No results for input(s): LIPASE, AMYLASE in the last 168 hours.  No results for input(s): AMMONIA in the last 168 hours.  CBC: Recent Labs  Lab 10/08/24 0527 10/09/24 1606 10/10/24 0650 10/13/24 0509  WBC 6.1 7.2 6.0 5.9  HGB 8.5* 8.3* 8.6* 8.1*  HCT 27.2* 26.1* 26.4* 25.6*  MCV 93.8 93.2 93.6 93.4  PLT 149* 198 200 202    Cardiac Enzymes: No results for input(s): CKTOTAL, CKMB, CKMBINDEX, TROPONINI in the last 168 hours.  BNP: Invalid input(s): POCBNP  CBG: Recent Labs  Lab 10/12/24 2144 10/13/24 0940 10/13/24 1543 10/13/24 2126 10/14/24 0829  GLUCAP 179* 164* 186* 133* 231*    Microbiology: Results for orders placed or performed during the hospital encounter of 09/29/24  Resp panel by RT-PCR (RSV, Flu A&B, Covid) Anterior Nasal Swab     Status: None   Collection Time: 09/29/24  2:16 PM   Specimen: Anterior Nasal Swab  Result Value Ref Range Status   SARS Coronavirus 2 by RT PCR NEGATIVE NEGATIVE Final    Comment: (NOTE) SARS-CoV-2 target nucleic acids are NOT DETECTED.  The SARS-CoV-2 RNA is generally detectable in upper respiratory specimens during the acute phase of infection. The lowest concentration of SARS-CoV-2 viral copies this  assay can detect is 138 copies/mL. A negative result does not preclude SARS-Cov-2 infection and should not be used as the sole basis for treatment or other patient management decisions. A negative result may occur with  improper specimen collection/handling, submission of specimen other than nasopharyngeal swab, presence of viral mutation(s) within the areas targeted by this assay, and inadequate number of viral copies(<138 copies/mL). A negative result must be combined with clinical observations, patient history, and epidemiological information. The expected result is  Negative.  Fact Sheet for Patients:  bloggercourse.com  Fact Sheet for Healthcare Providers:  seriousbroker.it  This test is no t yet approved or cleared by the [place]  FDA and  has been authorized for detection and/or diagnosis of SARS-CoV-2 by FDA under an Emergency Use Authorization (EUA). This EUA will remain  in effect (meaning this test can be used) for the duration of the COVID-19 declaration under Section 564(b)(1) of the Act, 21 U.S.C.section 360bbb-3(b)(1), unless the authorization is terminated  or revoked sooner.       Influenza A by PCR NEGATIVE NEGATIVE Final   Influenza B by PCR NEGATIVE NEGATIVE Final    Comment: (NOTE) The Xpert Xpress SARS-CoV-2/FLU/RSV plus assay is intended as an aid in the diagnosis of influenza from Nasopharyngeal swab specimens and should not be used as a sole basis for treatment. Nasal washings and aspirates are unacceptable for Xpert Xpress SARS-CoV-2/FLU/RSV testing.  Fact Sheet for Patients: bloggercourse.com  Fact Sheet for Healthcare Providers: seriousbroker.it  This test is not yet approved or cleared by the [place]  FDA and has been authorized for detection and/or diagnosis of SARS-CoV-2 by FDA under an Emergency Use Authorization (EUA). This EUA will remain in effect (meaning this test can be used) for the duration of the COVID-19 declaration under Section 564(b)(1) of the Act, 21 U.S.C. section 360bbb-3(b)(1), unless the authorization is terminated or revoked.     Resp Syncytial Virus by PCR NEGATIVE NEGATIVE Final    Comment: (NOTE) Fact Sheet for Patients: bloggercourse.com  Fact Sheet for Healthcare Providers: seriousbroker.it  This test is not yet approved or cleared by the [place]  FDA and has been authorized for detection and/or diagnosis of  SARS-CoV-2 by FDA under an Emergency Use Authorization (EUA). This EUA will remain in effect (meaning this test can be used) for the duration of the COVID-19 declaration under Section 564(b)(1) of the Act, 21 U.S.C. section 360bbb-3(b)(1), unless the authorization is terminated or revoked.  Performed at [facility], [address], [place], [place] [address]   Urine Culture     Status: Abnormal   Collection Time: 09/29/24  2:17 PM   Specimen: Urine, Random  Result Value Ref Range Status   Specimen Description   Final    URINE, RANDOM Performed at [facility], [address] [place], [place] [address]    Special Requests   Final    NONE Reflexed from [phone] Performed at [facility], [address] [place], [place] [address]    Culture (A)  Final    >=100,000 COLONIES/mL PROTEUS MIRABILIS >=100,000 COLONIES/mL METHICILLIN RESISTANT STAPHYLOCOCCUS AUREUS    Report Status 10/02/2024 FINAL  Final   Organism ID, Bacteria PROTEUS MIRABILIS (A)  Final   Organism ID, Bacteria METHICILLIN RESISTANT STAPHYLOCOCCUS AUREUS (A)  Final      Susceptibility   Methicillin resistant staphylococcus aureus - MIC*    CIPROFLOXACIN  >=8 RESISTANT Resistant     GENTAMICIN <=0.5 SENSITIVE Sensitive     NITROFURANTOIN <=16 SENSITIVE  Sensitive     OXACILLIN >=4 RESISTANT Resistant     TETRACYCLINE <=1 SENSITIVE Sensitive     VANCOMYCIN  1 SENSITIVE Sensitive     TRIMETH /SULFA  <=10 SENSITIVE Sensitive     RIFAMPIN  <=0.5 SENSITIVE Sensitive     Inducible Clindamycin NEGATIVE Sensitive     LINEZOLID  2 SENSITIVE Sensitive     * >=100,000 COLONIES/mL METHICILLIN RESISTANT STAPHYLOCOCCUS AUREUS   Proteus mirabilis - MIC*    AMPICILLIN <=2 SENSITIVE Sensitive     CEFAZOLIN  (URINE) Value in next row Sensitive      4 SENSITIVEThis is a modified FDA-approved test that has been validated and its performance characteristics determined by the reporting laboratory.  This  laboratory is certified under the Clinical Laboratory Improvement Amendments CLIA as qualified to perform high complexity clinical laboratory testing.    CEFEPIME  Value in next row Sensitive      4 SENSITIVEThis is a modified FDA-approved test that has been validated and its performance characteristics determined by the reporting laboratory.  This laboratory is certified under the Clinical Laboratory Improvement Amendments CLIA as qualified to perform high complexity clinical laboratory testing.    ERTAPENEM Value in next row Sensitive      4 SENSITIVEThis is a modified FDA-approved test that has been validated and its performance characteristics determined by the reporting laboratory.  This laboratory is certified under the Clinical Laboratory Improvement Amendments CLIA as qualified to perform high complexity clinical laboratory testing.    CEFTRIAXONE  Value in next row Sensitive      4 SENSITIVEThis is a modified FDA-approved test that has been validated and its performance characteristics determined by the reporting laboratory.  This laboratory is certified under the Clinical Laboratory Improvement Amendments CLIA as qualified to perform high complexity clinical laboratory testing.    CIPROFLOXACIN  Value in next row Resistant      4 SENSITIVEThis is a modified FDA-approved test that has been validated and its performance characteristics determined by the reporting laboratory.  This laboratory is certified under the Clinical Laboratory Improvement Amendments CLIA as qualified to perform high complexity clinical laboratory testing.    GENTAMICIN Value in next row Sensitive      4 SENSITIVEThis is a modified FDA-approved test that has been validated and its performance characteristics determined by the reporting laboratory.  This laboratory is certified under the Clinical Laboratory Improvement Amendments CLIA as qualified to perform high complexity clinical laboratory testing.    NITROFURANTOIN Value in  next row Resistant      4 SENSITIVEThis is a modified FDA-approved test that has been validated and its performance characteristics determined by the reporting laboratory.  This laboratory is certified under the Clinical Laboratory Improvement Amendments CLIA as qualified to perform high complexity clinical laboratory testing.    TRIMETH /SULFA  Value in next row Sensitive      4 SENSITIVEThis is a modified FDA-approved test that has been validated and its performance characteristics determined by the reporting laboratory.  This laboratory is certified under the Clinical Laboratory Improvement Amendments CLIA as qualified to perform high complexity clinical laboratory testing.    AMPICILLIN/SULBACTAM Value in next row Sensitive      4 SENSITIVEThis is a modified FDA-approved test that has been validated and its performance characteristics determined by the reporting laboratory.  This laboratory is certified under the Clinical Laboratory Improvement Amendments CLIA as qualified to perform high complexity clinical laboratory testing.    PIP/TAZO Value in next row Sensitive      <=4 SENSITIVEThis is a modified FDA-approved  test that has been validated and its performance characteristics determined by the reporting laboratory.  This laboratory is certified under the Clinical Laboratory Improvement Amendments CLIA as qualified to perform high complexity clinical laboratory testing.    MEROPENEM Value in next row Sensitive      <=4 SENSITIVEThis is a modified FDA-approved test that has been validated and its performance characteristics determined by the reporting laboratory.  This laboratory is certified under the Clinical Laboratory Improvement Amendments CLIA as qualified to perform high complexity clinical laboratory testing.    * >=100,000 COLONIES/mL PROTEUS MIRABILIS  Blood Culture (routine x 2)     Status: Abnormal   Collection Time: 09/29/24  2:37 PM   Specimen: BLOOD  Result Value Ref Range Status    Specimen Description   Final    BLOOD BLOOD RIGHT FOREARM Performed at [facility], [address], [place], [place] [address]    Special Requests   Final    BOTTLES DRAWN AEROBIC AND ANAEROBIC Blood Culture results may not be optimal due to an inadequate volume of blood received in culture bottles Performed at [facility], [address], [place], [place] [address]    Culture  Setup Time   Final    GRAM POSITIVE COCCI IN BOTH AEROBIC AND ANAEROBIC BOTTLES CRITICAL VALUE NOTED.  VALUE IS CONSISTENT WITH PREVIOUSLY REPORTED AND CALLED VALUE.    Culture (A)  Final    STAPHYLOCOCCUS AUREUS SUSCEPTIBILITIES PERFORMED ON PREVIOUS CULTURE WITHIN THE LAST 5 DAYS. Performed at [facility], [address] [place], [place] [address]    Report Status 10/02/2024 FINAL  Final  Blood Culture (routine x 2)     Status: Abnormal   Collection Time: 09/29/24  2:37 PM   Specimen: BLOOD  Result Value Ref Range Status   Specimen Description   Final    BLOOD LEFT ANTECUBITAL Performed at [facility], [address] [place], [place] [address]    Special Requests   Final    BOTTLES DRAWN AEROBIC AND ANAEROBIC Blood Culture results may not be optimal due to an inadequate volume of blood received in culture bottles Performed at [facility], [address], [place], [place] [address]    Culture  Setup Time   Final    GRAM POSITIVE COCCI IN BOTH AEROBIC AND ANAEROBIC BOTTLES CRITICAL RESULT CALLED TO, READ BACK BY AND VERIFIED WITH: PHARMD NATHAN B ON [id number] @0457  BY HNM    Culture (A)  Final    METHICILLIN RESISTANT STAPHYLOCOCCUS AUREUS SEE SEPARATE REPORT Performed at [facility], [address], [place], [place] [address]    Report Status 10/12/2024 FINAL  Final   Organism ID, Bacteria METHICILLIN RESISTANT STAPHYLOCOCCUS AUREUS  Final      Susceptibility   Methicillin resistant staphylococcus aureus - MIC*    CIPROFLOXACIN  >=8 RESISTANT  Resistant     ERYTHROMYCIN  >=8 RESISTANT Resistant     GENTAMICIN <=0.5 SENSITIVE Sensitive     OXACILLIN >=4 RESISTANT Resistant     TETRACYCLINE <=1 SENSITIVE Sensitive     VANCOMYCIN  1 SENSITIVE Sensitive     TRIMETH /SULFA  <=10 SENSITIVE Sensitive     CLINDAMYCIN >=8 RESISTANT Resistant     RIFAMPIN  <=0.5 SENSITIVE Sensitive     Inducible Clindamycin NEGATIVE Sensitive     LINEZOLID  2 SENSITIVE Sensitive     * METHICILLIN RESISTANT STAPHYLOCOCCUS AUREUS  Blood Culture ID Panel (Reflexed)     Status: Abnormal   Collection Time: 09/29/24  2:37 PM  Result Value Ref Range Status   Enterococcus faecalis NOT DETECTED NOT DETECTED Final   Enterococcus Faecium NOT DETECTED NOT DETECTED Final   Listeria monocytogenes NOT DETECTED NOT DETECTED Final   Staphylococcus species DETECTED (A) NOT DETECTED Final    Comment: CRITICAL RESULT CALLED TO, READ BACK BY AND VERIFIED WITHBETHA RANKIN DILLS PHARMD [id number] 09/30/24 HNM    Staphylococcus aureus (BCID) DETECTED (A) NOT DETECTED Final    Comment: Methicillin (oxacillin)-resistant Staphylococcus aureus (MRSA). MRSA is predictably resistant to beta-lactam antibiotics (except ceftaroline). Preferred therapy is vancomycin  unless clinically contraindicated. Patient requires contact precautions if  hospitalized. CRITICAL RESULT CALLED TO, READ BACK BY AND VERIFIED WITH: RANKIN DILLS PHARMD [id number] 09/30/24 HNM    Staphylococcus epidermidis NOT DETECTED NOT DETECTED Final   Staphylococcus lugdunensis NOT DETECTED NOT DETECTED Final   Streptococcus species NOT DETECTED NOT DETECTED Final   Streptococcus agalactiae NOT DETECTED NOT DETECTED Final   Streptococcus pneumoniae NOT DETECTED NOT DETECTED Final   Streptococcus pyogenes NOT DETECTED NOT DETECTED Final   A.calcoaceticus-baumannii NOT DETECTED NOT DETECTED Final   Bacteroides fragilis NOT DETECTED NOT DETECTED Final   Enterobacterales NOT DETECTED NOT DETECTED Final   Enterobacter cloacae complex NOT  DETECTED NOT DETECTED Final   Escherichia coli NOT DETECTED NOT DETECTED Final   Klebsiella aerogenes NOT DETECTED NOT DETECTED Final   Klebsiella oxytoca NOT DETECTED NOT DETECTED Final   Klebsiella pneumoniae NOT DETECTED NOT DETECTED Final   Proteus species NOT DETECTED NOT DETECTED Final   Salmonella species NOT DETECTED NOT DETECTED Final   Serratia marcescens NOT DETECTED NOT DETECTED Final   Haemophilus influenzae NOT DETECTED NOT DETECTED Final   Neisseria meningitidis NOT DETECTED NOT DETECTED Final   Pseudomonas aeruginosa NOT DETECTED NOT DETECTED Final   Stenotrophomonas maltophilia NOT DETECTED NOT DETECTED Final   Candida albicans NOT DETECTED NOT DETECTED Final   Candida auris NOT DETECTED NOT DETECTED Final   Candida glabrata NOT DETECTED NOT DETECTED Final   Candida krusei NOT DETECTED NOT DETECTED Final   Candida parapsilosis NOT DETECTED NOT DETECTED Final   Candida tropicalis NOT DETECTED NOT DETECTED Final   Cryptococcus neoformans/gattii NOT DETECTED NOT DETECTED Final   Meth resistant mecA/C and MREJ DETECTED (A) NOT DETECTED Final    Comment: CRITICAL RESULT CALLED TO, READ BACK BY AND VERIFIED WITHBETHA RANKIN DILLS PHARMD 9542 09/30/24 HNM Performed at [facility], [address] [place], [place] [address]   MIC (1 Drug)-     Status: Abnormal   Collection Time: 09/29/24  2:37 PM  Result Value Ref Range Status   Min Inhibitory Conc (1 Drug) Final report (A)  Corrected    Comment: (NOTE) Performed At: [facility] [employer] [address] [place], [place] [id number] Jennette Shorter MD Ey:[phone] CORRECTED ON 01/16 AT 0636: PREVIOUSLY REPORTED AS Preliminary report    Source BLOOD  Final    Comment: Performed at [facility], [address], [place], [place] [address]  MIC Result     Status: Abnormal   Collection Time: 09/29/24  2:37 PM  Result Value Ref Range Status   Result 1 (MIC) Comment (A)  Final    Comment: (NOTE) Methicillin -  resistant Staphylococcus aureus Identification performed by account, not confirmed by this laboratory. DAPTOMYCIN     <=1 UG/ML = SUSCEPTIBLE Performed At: [facility] [address] [place], [place] [id number] Jennette Shorter MD Ey:[phone]   MRSA Next Gen by PCR, Nasal     Status: Abnormal  Collection Time: 09/30/24  5:00 PM   Specimen: Nasal Mucosa; Nasal Swab  Result Value Ref Range Status   MRSA by PCR Next Gen DETECTED (A) NOT DETECTED Final    Comment: RESULT CALLED TO, READ BACK BY AND VERIFIED WITH: TAMMY ORLANDO 2032 09/30/24 MU (NOTE) The GeneXpert MRSA Assay (FDA approved for NASAL specimens only), is one component of a comprehensive MRSA colonization surveillance program. It is not intended to diagnose MRSA infection nor to guide or monitor treatment for MRSA infections. Test performance is not FDA approved in patients less than 31 years old. Performed at [facility], [address] [place], [place] [address]   Cath Tip Culture     Status: Abnormal   Collection Time: 10/01/24 11:03 AM   Specimen: Catheter Tip; Other  Result Value Ref Range Status   Specimen Description   Final    CATH TIP Performed at [facility], [address] [place], [place] [address]    Special Requests   Final    NONE Performed at [facility], [address] [place], [place] [address]    Culture METHICILLIN RESISTANT STAPHYLOCOCCUS AUREUS (A)  Final   Report Status 10/03/2024 FINAL  Final   Organism ID, Bacteria METHICILLIN RESISTANT STAPHYLOCOCCUS AUREUS  Final      Susceptibility   Methicillin resistant staphylococcus aureus - MIC*    CIPROFLOXACIN  >=8 RESISTANT Resistant     ERYTHROMYCIN  >=8 RESISTANT Resistant     GENTAMICIN <=0.5 SENSITIVE Sensitive     OXACILLIN >=4 RESISTANT Resistant     TETRACYCLINE <=1 SENSITIVE Sensitive     VANCOMYCIN  <=0.5 SENSITIVE Sensitive     TRIMETH /SULFA  <=10 SENSITIVE Sensitive     CLINDAMYCIN >=8  RESISTANT Resistant     RIFAMPIN  <=0.5 SENSITIVE Sensitive     Inducible Clindamycin NEGATIVE Sensitive     LINEZOLID  2 SENSITIVE Sensitive     * METHICILLIN RESISTANT STAPHYLOCOCCUS AUREUS  Culture, blood (Routine X 2) w Reflex to ID Panel     Status: None   Collection Time: 10/02/24  6:36 AM   Specimen: BLOOD  Result Value Ref Range Status   Specimen Description BLOOD BLOOD LEFT ARM  Final   Special Requests   Final    BOTTLES DRAWN AEROBIC AND ANAEROBIC Blood Culture adequate volume   Culture   Final    NO GROWTH 5 DAYS Performed at [facility], [address] [place], [place] [address]    Report Status 10/07/2024 FINAL  Final  Culture, blood (Routine X 2) w Reflex to ID Panel     Status: None   Collection Time: 10/02/24  6:36 AM   Specimen: BLOOD  Result Value Ref Range Status   Specimen Description BLOOD BLOOD LEFT ARM  Final   Special Requests   Final    BOTTLES DRAWN AEROBIC AND ANAEROBIC Blood Culture adequate volume   Culture   Final    NO GROWTH 5 DAYS Performed at [facility], [address], [place], [place] [address]    Report Status 10/07/2024 FINAL  Final  Body fluid culture w Gram Stain     Status: None (Preliminary result)   Collection Time: 10/13/24  2:58 PM   Specimen: PATH Cytology Pleural fluid  Result Value Ref Range Status   Specimen Description   Final    PLEURAL Performed at [facility], [address] [place], [place] [address]    Special Requests   Final    CYTO PLEU Performed  at [facility], [address] [place], [place] [address]    Gram Stain   Final    NO WBC SEEN NO ORGANISMS SEEN Performed at [facility], [address] [place], [place] [address]    Culture PENDING  Incomplete   Report Status PENDING  Incomplete    Coagulation Studies: No results for input(s): LABPROT, INR in the last 72 hours.   Urinalysis: No results for input(s): COLORURINE, LABSPEC, PHURINE,  GLUCOSEU, HGBUR, BILIRUBINUR, KETONESUR, PROTEINUR, UROBILINOGEN, NITRITE, LEUKOCYTESUR in the last 72 hours.  Invalid input(s): APPERANCEUR     Imaging: DG Abd 1 View Result Date: 10/14/2024 EXAM: 1 VIEW XRAY OF THE ABDOMEN 10/14/2024 07:13:00 AM COMPARISON: Supine abdomen study yesterday at 9 o'clock am. CLINICAL HISTORY: [id number] [facility]) [phone] [facility] (HCC) [phone] [facility] (HCC) [phone] [place] (HCC) [phone] FINDINGS: BOWEL: Nonobstructive bowel gas pattern with moderate fecal stasis. There is no supine evidence of free air. SOFT TISSUES: Small pleural effusions and overlying lung base opacities are present. BONES: Fusion hardware in the thoracic spine is again noted. No acute fracture. IMPRESSION: 1. Nonobstructive bowel gas pattern without supine evidence of pneumoperitoneum; moderate fecal stasis. Unchanged. 2. Small pleural effusions with overlying lung base opacities. Electronically signed by: Francis Quam MD 10/14/2024 07:34 AM EST RP Workstation: [id number]   DG Chest Port 1 View Result Date: 10/13/2024 CLINICAL DATA:  Shortness of breath EXAM: PORTABLE CHEST 1 VIEW COMPARISON:  10/13/2024, 10/11/2024, chest CT 10/06/2024 FINDINGS: Left-sided central venous catheter tip projecting over the brachiocephalic vein/SVC confluence. Extensive spinal hardware as before. Cardiomegaly with pleural effusions and left greater than right basilar airspace disease, not significantly changed. Background vascular congestion and pulmonary edema. No pneumothorax IMPRESSION: 1. Cardiomegaly with vascular congestion and pulmonary edema. 2. Small bilateral pleural effusions with left greater than right basilar airspace disease, not significantly changed. No pneumothorax is seen Electronically Signed   By: Luke Bun M.D.   On: 10/13/2024 19:36   US  THORACENTESIS LEFT ASP PLEURAL SPACE W/IMG GUIDE Result Date: 10/13/2024 INDICATION: Patient with a history of end-stage renal disease on  hemodialysis and heart failure presents today with left pleural effusion. Interventional Radiology asked to perform a diagnostic and therapeutic thoracentesis. EXAM: ULTRASOUND GUIDED THORACENTESIS MEDICATIONS: 1% lidocaine  10 COMPLICATIONS: None immediate. PROCEDURE: An ultrasound guided thoracentesis was thoroughly discussed with the patient and questions answered. The benefits, risks, alternatives and complications were also discussed. The patient understands and wishes to proceed with the procedure. Written consent was obtained. Ultrasound was performed to localize and mark an adequate pocket of fluid in the left chest. The area was then prepped and draped in the normal sterile fashion. 1% Lidocaine  was used for local anesthesia. Under ultrasound guidance a 6 Fr Safe-T-Centesis catheter was introduced. Thoracentesis was performed. The catheter was removed and a dressing applied. FINDINGS: A total of approximately 200 mL of clear yellow fluid was removed. Samples were sent to the laboratory as requested by the clinical team. IMPRESSION: Successful ultrasound guided left thoracentesis yielding 200 mL of pleural fluid. Procedure performed by: Warren Dais, NP Electronically Signed   By: Juliene Balder M.D.   On: 10/13/2024 15:51   DG Chest Port 1 View Result Date: 10/13/2024 CLINICAL DATA:  Status post thoracentesis. EXAM: PORTABLE CHEST 1 VIEW COMPARISON:  Chest radiograph dated 10/11/2024. FINDINGS: Left IJ catheter in similar position. Cardiomegaly with vascular congestion and edema relatively similar to prior radiograph. Small bilateral pleural effusions similar to prior radiograph. No pneumothorax. No acute  osseous pathology. Spinal hardware. IMPRESSION: No interval change.  No pneumothorax. Electronically Signed   By: Vanetta Chou M.D.   On: 10/13/2024 15:11   DG Abd 1 View Result Date: 10/13/2024 CLINICAL DATA:  Ileus. EXAM: ABDOMEN - 1 VIEW COMPARISON:  10/12/2024 FINDINGS: Bowel gas pattern is  nonobstructive. No free peritoneal air. There is moderate fecal retention over the colon most prominent over the left colon and rectosigmoid colon. Remainder of the exam is unchanged. IMPRESSION: Nonobstructive bowel gas pattern with moderate fecal retention over the colon. Electronically Signed   By: Toribio Agreste M.D.   On: 10/13/2024 09:16   DG Abd 1 View Result Date: 10/12/2024 EXAM: 1 VIEW XRAY OF THE ABDOMEN 10/12/2024 11:01:00 AM COMPARISON: 10/11/2024 CLINICAL HISTORY: [id number] Ileus [facility]) [id number] Ileus (HCC) [id number] [phone] Ileus (HCC) [id number] Ileus (HCC) [id number] Ileus (HCC) [phone] FINDINGS: BOWEL: Interval decreased gaseous distention of the bowel. Moderate colonic stool burden. The bowel gas pattern is nonobstructive. SOFT TISSUES: Small right pleural effusion. Probable left effusion and adjacent parenchymal opacity. No abnormal calcifications. BONES: Thoracic spinal fixation hardware in place. No acute fracture. IMPRESSION: 1. Decreased gaseous distention of the bowel. 2. Moderate colonic stool burden. 3. Small right pleural effusion with probable left pleural effusion and adjacent parenchymal opacity. Electronically signed by: Dayne Hassell MD 10/12/2024 12:21 PM EST RP Workstation: [id number]   US  CHEST (PLEURAL EFFUSION) Result Date: 10/12/2024 EXAM: US  Chest Limited, Evaluate for Pleural Effusion. 10/12/2024 10:57:09 AM TECHNIQUE: Real-time ultrasound of the chest to evaluate for pleural effusion. COMPARISON: CT 10/06/2024. CLINICAL HISTORY: Pleural effusion on right. FINDINGS: LEFT PLEURAL SPACE: Small left pleural effusion. RIGHT PLEURAL SPACE: Small right pleural effusion with some adjacent atelectatic/consolidated pulmonary parenchyma. IMPRESSION: 1. Small right pleural effusion with adjacent atelectatic/consolidated pulmonary parenchyma. 2. Small left pleural effusion. Electronically signed by: Dayne Hassell MD 10/12/2024 12:19 PM EST RP Workstation: [id number]     Medications:     anticoagulant sodium citrate      vancomycin  Stopped (10/12/24 1834)    bisacodyl   10 mg Oral QHS   calcitRIOL   0.5 mcg Oral Daily   carvedilol   6.25 mg Oral BID WC   Chlorhexidine  Gluconate Cloth  6 each Topical Q0600   cyanocobalamin   1,000 mcg Oral Daily   diltiazem   240 mg Oral QPM   doxepin   10 mg Oral QHS   epoetin  alfa-epbx (RETACRIT ) injection  4,000 Units Intravenous Q M,W,F-1800   famotidine   10 mg Oral Daily   folic acid   1 mg Oral Daily   gabapentin   300 mg Oral QHS   heparin   5,000 Units Subcutaneous Q8H   insulin  aspart  0-5 Units Subcutaneous QHS   insulin  aspart  0-6 Units Subcutaneous TID WC   insulin  aspart  9 Units Subcutaneous TID WC   insulin  glargine  22 Units Subcutaneous BID   ipratropium-albuterol   3 mL Nebulization BID   levothyroxine   175 mcg Oral QHS   lidocaine  (PF)  10 mL Other Once   linaclotide   145 mcg Oral QAC breakfast   loratadine   10 mg Oral Daily   nicotine   14 mg Transdermal Daily   pantoprazole   40 mg Oral Daily   polyethylene glycol  17 g Oral BID   senna-docusate  1 tablet Oral BID   simethicone   80 mg Oral QID   acetaminophen  **OR** acetaminophen , albuterol , anticoagulant sodium citrate , bisacodyl , guaiFENesin , liver oil-zinc  oxide, nicotine  polacrilex, ondansetron  **OR** ondansetron  (ZOFRAN ) IV, oxyCODONE   Assessment/ Plan:  Mr. Julus Kelley is a  64 y.o.  male with past medical conditions including diabetes, hepatitis C, bilateral BKA, anxiety and depression, and end-stage renal disease on hemodialysis.  Patient presents to the emergency department from his facility complaining of shortness of breath.  Patient is currently admitted for SIRS (systemic inflammatory response syndrome) (HCC) [R65.10]  [facility]/MWF/120.5 kg  End-stage renal disease on hemodialysis.        Due to infection, vascular removed right IJ PermCath on 1/8.  Received line free holiday. Vascular has placed lt internal jugular permcath.   Receiving  dialysis today, UF 2-2.5L as tolerated. Next treatment scheduled for Friday.   Anemia of chronic kidney disease Lab Results  Component Value Date   HGB 8.1 (L) 10/13/2024   Hemoglobin 8.1, Continue low dose retacrit  with dialysis  3.  Systemic inflammatory response syndrome, suspected due to fever, leukocytosis, and tachypnea on ED arrival.  Suspected source of suprapubic catheter. Abdominal x-ray shows moderate diffuse air distention of the bowel suspicious for ileus.  Blood culture positive for MRSA. Vascular removed PermCath on 1/8 and replaced on 1/12.  Continues to receive vancomycin , ordered per primary team.  ID consulted, recommends Vancomycin  1000mg  with dialysis until 11/11/24. Outpatient clinic notified  4. Secondary Hyperparathyroidism: with outpatient labs: None available  Lab Results  Component Value Date   PTH 177 (H) 04/23/2024   CALCIUM  8.0 (L) 10/13/2024   PHOS 5.3 (H) 10/10/2024   Will continue to monitor    LOS: 15 Saivon Prowse 1/21/202610:55 AM   "

## 2024-10-14 NOTE — Discharge Instructions (Signed)
 PICC line care per protocol Resume out pt HD as before

## 2024-10-14 NOTE — Progress Notes (Signed)
 Report called [facility] at [phone]. Given to [facility]

## 2024-10-14 NOTE — Discharge Summary (Addendum)
 " Physician Discharge Summary   Patient: Eugene [name] MRN: [id number] DOB: 1961-05-26  Admit date:     09/29/2024  Discharge date: 10/14/24  Discharge Physician: Leita Blanch   PCP: [place]   Recommendations at discharge:    F/u PCP at [facility] Video call appt with ID Dr MICKEY at 9:30 am on 11/03/24 F/u [facility] dr Dewane in 1-2 weeks  Discharge Diagnoses: Principal Problem:   SIRS (systemic inflammatory response syndrome) (HCC) Active Problems:   Abdominal discomfort   ESRD (end stage renal disease) (HCC)   Paroxysmal SVT (supraventricular tachycardia)   Heart failure with preserved ejection fraction (HCC)   Chronic respiratory failure with hypoxia (HCC)   Chronic bilateral pleural effusions   Chronic pain   Uncontrolled type 2 diabetes mellitus with hyperglycemia, with long-term current use of insulin  (HCC)   Chronic constipation   Obesity (BMI 30-39.9)   Hypothyroidism   MRSA bacteremia  Eugene [name] is a 64 y.o. male with medical history significant of anxiety, depression, type 2 diabetes mellitus, history of hepatitis C, end-stage renal disease on dialysis, bilateral BKA. He is having a fever. In the ER he was found to have an elevated white blood cell count and started on sepsis protocol.  Multiple admissions last year due to multiple co-morbidities. Poor historian.   Sepsis secondary to MRSA bacteremia Present on admission.  Sepsis criteria fever leukocytosis and tachycardia.  Now resolved.  MRSA positive in 4 out of 4 bottles. HD catheter removed 1/8 Afebrile overnight Repeat blood cultures no growth to date Continue vancomycin  --BC on admission positive for  MRSA  --CT of the chest given patient intermittently requiring 10 L of oxygen up from baseline of 6 L 1/13 CT Chest negative for PE, bilateral pleural effusion, mild to moderate.  Atelectasis -- Will likely need 6 weeks of antibiotics given likely cannot undergo TEE 1/14 cardiology consulted to eval for  TEE.  Cardiology recommended no TEE, risk outweigh the benefit.  Advised to continue antibiotics for longer duration.  ID is aware. As per ID, continue vancomycin  1250mg  IV with HD on MWF End date: 11/11/2024. Pt to f/I ID  on 11/03/24--video call appt   Ileus H/o chronic constipation Small and large bowel ileus 1/14 KUB: Diffusely gas-filled, although not overtly distended, small bowel and colon throughout abdomen and pelvis. nonobstructive bowel gas pattern. 1/7 CT a/p: notable for proctocolitis but no findings to suggest etiology of his nausea.   --Continue antiemetics, aggressive bowel regimen --Continue zofran  prn --KUB showed constipation --several small and moderate BM documented by RN --cont current extensive Bowel regimen. Tolerating po diet   Normocytic anemia  Thrombocytopenia improving Likely in settong of anemia of chronic disease and infection. H/o B12 deficiency Continue B12 supplementation, folate borderline, continue supplementation     ESRD (end stage renal disease) (HCC) Hemodialysis per nephrology --ok to d/c from nephrology standpoint after HD today and resume outpt HD --left internal jugular placed during hospital stay    Paroxysmal SVT (supraventricular tachycardia) On Cardizem  and Coreg    Heart failure with preserved ejection fraction (HCC) Dialysis to manage fluid    Bilateral pleural effusion 1/19 IR consulted for thoracentesis, effusion Left >Right but could not drain, no safe window.  1/20 IR-- Successful ultrasound guided left thoracentesis yielding 200 mL of pleural fluid. Fluid no growth    Acute on chronic respiratory failure with hypoxia due to COPD/CHF Suspected OSA --Patient oxygen level intermittently increased for comfort, he maintained appropriate oxygen saturations on 6 L.  An ABG was obtained after placing patient on 6 L of oxygen for 3 hours and his O2 saturation was 96%.  Patient currently refusing CPAP machine. --Incentive  spirometry --Maintain O2 saturations greater than 92%   Asymptomatic bacteruria  Neurogenic bladder- has a suprapubic catheter  --Ucx with proteus mirabilis, and MRSA. MRSA due to MRSA bacteremia likely in setting of line infection.   HTN --Blood pressure remains slightly elevated. --Continue Coreg  and Cardizem     Chronic pain On oxycodone    Uncontrolled type 2 diabetes mellitus with hyperglycemia, with long-term current use of insulin  (HCC) A1c 7.3 though ESRD.  --resume Lantus  and SSI      Hypothyroidism: On levothyroxine    Vitamin D  deficiency: Started Calcitrol 0.5 mcg p.o. daily. follow with PCP to repeat vitamin D  level in between 3 to 6 months.   Obesity class II Body mass index is 39.7 kg/m.  Interventions: Calorie restricted diet advised to lose body weight.      Wound 09/29/24 1832 Pressure Injury Buttocks Right;Left Stage 2 -  Partial thickness loss of dermis presenting as a shallow open injury with a red, pink wound bed without slough. (Activ      Pt has multiple co-morbidities and is at high risk for readmisison.  Addendum: pt got UF of 600 cc. On 7 liters [place] sats 91-93%. Dr Dennise is recommending if pt can stay pvernite and wean down to 6 liters. Hold d/c today  Pain control - [place]  Controlled Substance Reporting System database was reviewed. and patient was instructed, not to drive, operate heavy machinery, perform activities at heights, swimming or participation in water  activities or provide baby-sitting services while on Pain, Sleep and Anxiety Medications; until their outpatient Physician has advised to do so again. Also recommended to not to take more than prescribed Pain, Sleep and Anxiety Medications.  Consultants: ID, Nephrology Procedures performed: HD, thoracentesis  Disposition: Long term care facility Diet recommendation:  Renal diet DISCHARGE MEDICATION: Allergies as of 10/14/2024   No Known Allergies      Medication List     STOP  taking these medications    sitaGLIPtin 25 MG tablet Commonly known as: JANUVIA       TAKE these medications    acetaminophen  325 MG tablet Commonly known as: TYLENOL  Take 650 mg by mouth in the morning and at bedtime.   bisacodyl  10 MG suppository Commonly known as: DULCOLAX Place 10 mg rectally daily as needed for moderate constipation.   bisacodyl  5 MG EC tablet Commonly known as: DULCOLAX Take 2 tablets (10 mg total) by mouth at bedtime.   calcitRIOL  0.5 MCG capsule Commonly known as: ROCALTROL  Take 1 capsule (0.5 mcg total) by mouth daily.   carvedilol  6.25 MG tablet Commonly known as: COREG  Take 1 tablet (6.25 mg total) by mouth 2 (two) times daily with a meal. What changed: additional instructions   cetirizine 10 MG tablet Commonly known as: ZYRTEC Take 10 mg by mouth daily.   cyanocobalamin  1000 MCG tablet Take 1 tablet (1,000 mcg total) by mouth daily.   diltiazem  240 MG 24 hr capsule Commonly known as: CARDIZEM  CD Take 1 capsule (240 mg total) by mouth every evening. What changed: additional instructions   Doxepin  HCl 6 MG Tabs Take 6 mg by mouth at bedtime.   famotidine  10 MG tablet Commonly known as: PEPCID  Take 1 tablet (10 mg total) by mouth daily.   folic acid  1 MG tablet Commonly known as: FOLVITE  Take 1 tablet (1 mg total)  by mouth daily.   furosemide  80 MG tablet Commonly known as: LASIX  Take 1 tablet (80 mg total) by mouth daily. What changed:  how much to take when to take this additional instructions   gabapentin  300 MG capsule Commonly known as: NEURONTIN  Take 300 mg by mouth at bedtime.   guaifenesin  100 MG/5ML syrup Commonly known as: ROBITUSSIN Take 200 mg by mouth 3 (three) times daily as needed for cough.   insulin  glargine-yfgn 100 UNIT/ML injection Commonly known as: SEMGLEE  Inject 0.2 mLs (20 Units total) into the skin 2 (two) times daily.   insulin  lispro 100 UNIT/ML injection Commonly known as: HUMALOG  Inject  0-15 Units into the skin 4 (four) times daily -  before meals and at bedtime. <150= 0 units 151-200= 3 units 201-250= 6 units 251-300= 9 units 301-350= 12 units 351-400= 15 units >400 call MD   ipratropium-albuterol  0.5-2.5 (3) MG/3ML Soln Commonly known as: DUONEB Take 3 mLs by nebulization every 6 (six) hours as needed.   Lactulose  20 GM/30ML Soln Take 30 mLs (20 g total) by mouth 2 (two) times daily. What changed: how much to take   levothyroxine  175 MCG tablet Commonly known as: SYNTHROID  Take 175 mcg by mouth at bedtime.   linaclotide  145 MCG Caps capsule Commonly known as: LINZESS  Take 1 capsule (145 mcg total) by mouth daily before breakfast.   liver oil-zinc  oxide 40 % ointment Commonly known as: DESITIN Apply 1 Application topically as needed for irritation.   melatonin 5 MG Tabs Take 10 mg by mouth at bedtime.   metoCLOPramide  10 MG tablet Commonly known as: REGLAN  Take 0.5 tablets (5 mg total) by mouth every 8 (eight) hours as needed for nausea or vomiting.   midodrine  5 MG tablet Commonly known as: PROAMATINE  Take 20 mg by mouth. Monday, Wednesday and Friday   ondansetron  4 MG tablet Commonly known as: ZOFRAN  Take 4 mg by mouth every 8 (eight) hours as needed for nausea or vomiting.   oxyCODONE  15 MG immediate release tablet Commonly known as: ROXICODONE  Take 0.5 tablets (7.5 mg total) by mouth every 6 (six) hours as needed.   polyethylene glycol 17 g packet Commonly known as: MIRALAX  / GLYCOLAX  Take 17 g by mouth 2 (two) times daily.   predniSONE  10 MG tablet Commonly known as: DELTASONE  Take 10 mg by mouth daily with breakfast. What changed: Another medication with the same name was removed. Continue taking this medication, and follow the directions you see here.   senna-docusate 8.6-50 MG tablet Commonly known as: Senokot-S Take 1 tablet by mouth 2 (two) times daily.   simethicone  80 MG chewable tablet Commonly known as: MYLICON Chew 1  tablet (80 mg total) by mouth 4 (four) times daily.   vancomycin  IVPB Inject 1,000 mg into the vein every Monday, Wednesday, and Friday for 28 days. Vancomycin  1000mg  IV MWF with hemodialysis Indication:  MRSA bacteremia Last Day of Therapy:  11/11/2024 Labs - Monday:  CBC/D, CMP, and pre-HD vancomycin  level Fax weekly lab results  promptly to [phone] Call [phone] with questions or critical value To be given at HD center   Vitamin D  (Ergocalciferol ) 1.25 MG (50000 UNIT) Caps capsule Commonly known as: DRISDOL  Take 1 capsule (50,000 Units total) by mouth every 7 (seven) days.               Home Infusion Instuctions  (From admission, onward)           Start     Ordered  10/14/24 0000  Home infusion instructions       Comments: Getting antibiotic at hemodialysis  Question Answer Comment  Instructions Other   Comments dialysis      10/14/24 0920            Follow-up Information     [facility] Follow up.   Specialty: Geriatric Medicine Why: hospital follow up Contact information: [address] [place] [address] [phone]         Custovic, Annalee, DO. Go in 1 month(s).   Specialty: Cardiology Contact information: [address] [place] [address] [phone]         Fayette Bodily, MD. Go on 11/03/2024.   Specialty: Infectious Diseases Why: Video call appt at 9:30 am Contact information: [address] [place] [address] [phone]                Discharge Exam: Fredricka Weights   10/11/24 0602 10/12/24 1405 10/12/24 1700  Weight: 129.5 kg 126.7 kg 125.5 kg   Morbidly obese, chronically ill appearing Resp--decrease BS bases CV s1s2 normal no murmur Abd--suprapubic cath + Wound 09/29/24 1832 Pressure Injury Buttocks Right;Left Stage 2 -  Partial thickness loss of dermis presenting as a shallow open injury with a red, pink wound bed without slough. (Active)       Condition at discharge: fair  The results of significant diagnostics from this hospitalization (including imaging, microbiology, ancillary and laboratory) are listed below for reference.   Imaging Studies: DG Abd 1 View Result Date: 10/14/2024 EXAM: 1 VIEW XRAY OF THE ABDOMEN 10/14/2024 07:13:00 AM COMPARISON: Supine abdomen study yesterday at 9 o'clock am. CLINICAL HISTORY: 01250 Ileus [facility]) [id number] [phone] Ileus (HCC) [id number] Ileus (HCC) [id number] [id number] Ileus (HCC) [phone] FINDINGS: BOWEL: Nonobstructive bowel gas pattern with moderate fecal stasis. There is no supine evidence of free air. SOFT TISSUES: Small pleural effusions and overlying lung base opacities are present. BONES: Fusion hardware in the thoracic spine is again noted. No acute fracture. IMPRESSION: 1. Nonobstructive bowel gas pattern without supine evidence of pneumoperitoneum; moderate fecal stasis. Unchanged. 2. Small pleural effusions with overlying lung base opacities. Electronically signed by: Francis Quam MD 10/14/2024 07:34 AM EST RP Workstation: [id number]   DG Chest Port 1 View Result Date: 10/13/2024 CLINICAL DATA:  Shortness of breath EXAM: PORTABLE CHEST 1 VIEW COMPARISON:  10/13/2024, 10/11/2024, chest CT 10/06/2024 FINDINGS: Left-sided central venous catheter tip projecting over the brachiocephalic vein/SVC confluence. Extensive spinal hardware as before. Cardiomegaly with pleural effusions and left greater than right basilar airspace disease, not significantly changed. Background vascular congestion and pulmonary edema. No pneumothorax IMPRESSION: 1. Cardiomegaly with vascular congestion and pulmonary edema. 2. Small bilateral pleural effusions with left greater than right basilar airspace disease, not significantly changed. No pneumothorax is seen Electronically Signed   By: Luke Bun M.D.   On: 10/13/2024 19:36   US  THORACENTESIS LEFT ASP PLEURAL SPACE W/IMG GUIDE Result Date: 10/13/2024 INDICATION: Patient with a  history of end-stage renal disease on hemodialysis and heart failure presents today with left pleural effusion. Interventional Radiology asked to perform a diagnostic and therapeutic thoracentesis. EXAM: ULTRASOUND GUIDED THORACENTESIS MEDICATIONS: 1% lidocaine  10 COMPLICATIONS: None immediate. PROCEDURE: An ultrasound guided thoracentesis was thoroughly discussed with the patient and questions answered. The benefits, risks, alternatives and complications were also discussed. The patient understands and wishes to proceed with the procedure. Written consent was obtained. Ultrasound was performed to localize and mark an adequate pocket of fluid in  the left chest. The area was then prepped and draped in the normal sterile fashion. 1% Lidocaine  was used for local anesthesia. Under ultrasound guidance a 6 Fr Safe-T-Centesis catheter was introduced. Thoracentesis was performed. The catheter was removed and a dressing applied. FINDINGS: A total of approximately 200 mL of clear yellow fluid was removed. Samples were sent to the laboratory as requested by the clinical team. IMPRESSION: Successful ultrasound guided left thoracentesis yielding 200 mL of pleural fluid. Procedure performed by: Warren Dais, NP Electronically Signed   By: Juliene Balder M.D.   On: 10/13/2024 15:51   DG Chest Port 1 View Result Date: 10/13/2024 CLINICAL DATA:  Status post thoracentesis. EXAM: PORTABLE CHEST 1 VIEW COMPARISON:  Chest radiograph dated 10/11/2024. FINDINGS: Left IJ catheter in similar position. Cardiomegaly with vascular congestion and edema relatively similar to prior radiograph. Small bilateral pleural effusions similar to prior radiograph. No pneumothorax. No acute osseous pathology. Spinal hardware. IMPRESSION: No interval change.  No pneumothorax. Electronically Signed   By: Vanetta Chou M.D.   On: 10/13/2024 15:11   DG Abd 1 View Result Date: 10/13/2024 CLINICAL DATA:  Ileus. EXAM: ABDOMEN - 1 VIEW COMPARISON:   10/12/2024 FINDINGS: Bowel gas pattern is nonobstructive. No free peritoneal air. There is moderate fecal retention over the colon most prominent over the left colon and rectosigmoid colon. Remainder of the exam is unchanged. IMPRESSION: Nonobstructive bowel gas pattern with moderate fecal retention over the colon. Electronically Signed   By: Toribio Agreste M.D.   On: 10/13/2024 09:16   DG Abd 1 View Result Date: 10/12/2024 EXAM: 1 VIEW XRAY OF THE ABDOMEN 10/12/2024 11:01:00 AM COMPARISON: 10/11/2024 CLINICAL HISTORY: 01250 Ileus [facility]) [id number] [phone] Ileus (HCC) [id number] [phone] Ileus (HCC) [id number] Ileus (HCC) [address]1250 [id number] Ileus (HCC) [phone] FINDINGS: BOWEL: Interval decreased gaseous distention of the bowel. Moderate colonic stool burden. The bowel gas pattern is nonobstructive. SOFT TISSUES: Small right pleural effusion. Probable left effusion and adjacent parenchymal opacity. No abnormal calcifications. BONES: Thoracic spinal fixation hardware in place. No acute fracture. IMPRESSION: 1. Decreased gaseous distention of the bowel. 2. Moderate colonic stool burden. 3. Small right pleural effusion with probable left pleural effusion and adjacent parenchymal opacity. Electronically signed by: Dayne Hassell MD 10/12/2024 12:21 PM EST RP Workstation: [id number]   US  CHEST (PLEURAL EFFUSION) Result Date: 10/12/2024 EXAM: US  Chest Limited, Evaluate for Pleural Effusion. 10/12/2024 10:57:09 AM TECHNIQUE: Real-time ultrasound of the chest to evaluate for pleural effusion. COMPARISON: CT 10/06/2024. CLINICAL HISTORY: Pleural effusion on right. FINDINGS: LEFT PLEURAL SPACE: Small left pleural effusion. RIGHT PLEURAL SPACE: Small right pleural effusion with some adjacent atelectatic/consolidated pulmonary parenchyma. IMPRESSION: 1. Small right pleural effusion with adjacent atelectatic/consolidated pulmonary parenchyma. 2. Small left pleural effusion. Electronically signed by: Katheleen Faes MD 10/12/2024 12:19 PM EST RP Workstation:  [id number]   DG Abd 1 View Result Date: 10/11/2024 EXAM: 1 VIEW XRAY OF THE ABDOMEN 10/11/2024 11:15:00 AM COMPARISON: 10/09/2024 CLINICAL HISTORY: 01250 Ileus (HCC) [id number] Ileus (HCC) [id number] Ileus (HCC) FINDINGS: BOWEL: Moderate distention of the stomach and gas-distended loops of bowel throughout the abdomen. Moderate stool burden. SOFT TISSUES: No abnormal calcifications. BONES: Thoracic fusion hardware in place. No acute fracture. IMPRESSION: 1. Gas distended loops of bowel throughout the abdomen may represent ileus. Moderate colonic stool burden. Electronically signed by: Michaeline Blanch MD 10/11/2024 11:24 AM EST RP Workstation: [id number]   DG Chest Port 1 View Result Date: 10/11/2024 EXAM: 1 VIEW XRAY OF THE CHEST 10/11/2024 11:15:00 AM COMPARISON: 10/03/2024 CLINICAL HISTORY:  Ileus (HCC) FINDINGS: LINES, TUBES AND DEVICES: Left IJ CVC in place with tip at the brachiocephalic vein/SVC confluence. LUNGS AND PLEURA: Low lung volumes. Diffuse interstitial opacities, likely pulmonary edema. Bibasilar pulmonary opacities. Moderate left and small right pleural effusions. No pneumothorax. HEART AND MEDIASTINUM: Similar cardiomegaly. No acute abnormality of the mediastinal silhouette. BONES AND SOFT TISSUES: Partially imaged thoracolumbar fusion hardware. No acute osseous abnormality. IMPRESSION: 1. Left IJ central venous catheter with tip at the confluence of the brachiocephalic veins. No pneumothorax. 2. Likely pulmonary edema. 3. Moderate left and small right pleural effusions with bibasilar opacity . Electronically signed by: Michaeline Blanch MD 10/11/2024 11:22 AM EST RP Workstation: [id number]   DG Abd 1 View Result Date: 10/09/2024 CLINICAL DATA:  Ileus EXAM: ABDOMEN - 1 VIEW COMPARISON:  Two days ago FINDINGS: No abnormal bowel dilatation is noted. Moderate amount of stool is seen in the right colon. No radio-opaque calculi or other significant radiographic abnormality are seen. IMPRESSION: Moderate  stool burden. No abnormal bowel dilatation. Electronically Signed   By: Lynwood Landy Raddle M.D.   On: 10/09/2024 11:56   DG Abd 1 View Result Date: 10/07/2024 EXAM: 1 VIEW XRAY OF THE ABDOMEN 10/07/2024 07:17:00 PM COMPARISON: XR Abdomen 09/29/2024, CT Abdomen/Pelvis 09/30/2024. CLINICAL HISTORY: SBO (small bowel obstruction) (HCC). FINDINGS: BOWEL: Diffusely gas-filled, although not overtly distended, small bowel and colon throughout abdomen and pelvis. nonobstructive bowel gas pattern. SOFT TISSUES: No abnormal calcifications. BONES: Postoperative changes in lower thoracic spine. No acute fracture. LUNGS: Bilateral lung base opacity. IMPRESSION: 1. Nonobstructive bowel gas pattern. 2. Bilateral lung base opacity. Electronically signed by: Morgane Naveau MD 10/07/2024 07:24 PM EST RP Workstation: [id number]   CT Angio Chest Pulmonary Embolism (PE) W or WO Contrast Result Date: 10/06/2024 CLINICAL DATA:  Possible sepsis.  Evaluate for pulmonary embolism. EXAM: CT ANGIOGRAPHY CHEST WITH CONTRAST TECHNIQUE: Multidetector CT imaging of the chest was performed using the standard protocol during bolus administration of intravenous contrast. Multiplanar CT image reconstructions and MIPs were obtained to evaluate the vascular anatomy. RADIATION DOSE REDUCTION: This exam was performed according to the departmental dose-optimization program which includes automated exposure control, adjustment of the mA and/or kV according to patient size and/or use of iterative reconstruction technique. CONTRAST:  OMNIPAQUE  IOHEXOL  350 MG/ML SOLN COMPARISON:  CT 06/23/2024, 05/11/2024 FINDINGS: Cardiovascular: Mild stable cardiomegaly. Mild calcified plaque over the left main and 3 vessel coronary arteries. Thoracic aorta is normal in caliber. Minimal calcified plaque over the thoracic aorta. Pulmonary arterial system is adequately opacified without definite pulmonary emboli. Remaining vascular structures are unremarkable.  Mediastinum/Nodes: 1 cm right paratracheal lymph node likely reactive. No other concerning mediastinal or hilar adenopathy. Mediastinal structures are otherwise unremarkable. Lungs/Pleura: Small to moderate bilateral pleural effusions left greater than right with associated compressive atelectasis within the lung bases as these findings are slightly worse to unchanged. There is patchy hazy airspace attenuation over the mid to upper lungs with areas of mild nodularity new since the prior exam and may represent an acute infectious or inflammatory process. Obstruction of a left posterior basilar bronchus. Mild debris along the right lateral wall of the trachea. Upper Abdomen: No acute findings of the visualized upper abdomen. Musculoskeletal: Stable bilateral changes suggesting gynecomastia. Spinal stabilization hardware over the thoracic spine bridging corpectomy of 2 vertebral body levels over the lower thoracic spine. Review of the MIP images confirms the above findings. IMPRESSION: 1. No evidence of pulmonary embolism. 2. Small to moderate bilateral pleural effusions left greater than right  with associated compressive atelectasis within the lung bases as these findings are slightly worse to unchanged. 3. Patchy hazy airspace attenuation over the mid to upper lungs with areas of mild nodularity new since the prior exam and may represent an acute infectious or inflammatory process. 4. Aortic atherosclerosis. Atherosclerotic coronary artery disease. Aortic Atherosclerosis (ICD10-I70.0). Electronically Signed   By: Toribio Agreste M.D.   On: 10/06/2024 15:13   PERIPHERAL VASCULAR CATHETERIZATION Result Date: 10/05/2024 See surgical note for result.  DG Chest Port 1 View Result Date: 10/03/2024 EXAM: 1 VIEW(S) XRAY OF THE CHEST 10/03/2024 04:21:00 PM COMPARISON: 09/29/2024 CLINICAL HISTORY: Oxygen desaturation FINDINGS: LINES, TUBES AND DEVICES: Right internal jugular hemodialysis catheter removed. LUNGS AND PLEURA:  Prominent vasculature slightly increased interstitial markings . Stable left lung base consolidation and small loculated left pleural effusion. Stable right basilar atelectasis. Coarsening interstitial markings. Right costophrenic angle collimated off-view No pneumothorax. HEART AND MEDIASTINUM: Stable cardiomegaly. Unchanged cardiomediastinal silhouette. BONES AND SOFT TISSUES: Thoracolumbar fusion hardware noted. No acute osseous abnormality. IMPRESSION: 1. Mild pulmonary edema. 2. Stable left lung base consolidation and small loculated left pleural effusion. Electronically signed by: Morgane Naveau MD MD 10/03/2024 07:17 PM EST RP Workstation: [id number]   ECHOCARDIOGRAM COMPLETE Result Date: 10/02/2024    ECHOCARDIOGRAM REPORT   Patient Name:   DOVER HEAD Date of Exam: 10/02/2024 Medical Rec #:  [id number]      Height:       70.0 in Accession #:    [id number]     Weight:       272.5 lb Date of Birth:  09-15-1961      BSA:          2.381 m Patient Age:    63 years       BP:           147/68 mmHg Patient Gender: M              HR:           79 bpm. Exam Location:  [facility] Procedure: 2D Echo, Cardiac Doppler and Color Doppler (Both Spectral and Color            Flow Doppler were utilized during procedure). Indications:     Bacteremia R78.81  History:         Patient has prior history of Echocardiogram examinations, most                  recent 04/19/2024. Risk Factors:Dyslipidemia and Diabetes. End                  stage renal disease.  Sonographer:     Christopher Furnace Referring Phys:  [id number] DONALD BERLIN Diagnosing Phys: Caron Poser  Sonographer Comments: Technically challenging study due to limited acoustic windows, no apical window and no subcostal window. Image acquisition challenging due to patient body habitus. IMPRESSIONS  1. Very technically difficult study.  2. The mitral valve was not well visualized. No evidence of mitral valve regurgitation. No evidence of mitral stenosis.  3. The aortic valve  has an indeterminant number of cusps. Aortic valve regurgitation is not visualized. No aortic stenosis is present.  4. Left ventricular ejection fraction, by estimation, is 55 to 60% based on parasternal windows only. The left ventricle probably has normal function, though very poorly visualized. Left ventricular endocardial border not optimally defined to evaluate regional wall motion. Left ventricular diastolic parameters are indeterminate.  5. Right ventricular systolic function was not well visualized. The right ventricular  size is not well visualized. Comparison(s): A prior study was performed on 04/19/2024. No significant change, though unable to accurately compare due to poor image quality. FINDINGS  Left Ventricle: Left ventricular ejection fraction, by estimation, is 55 to 60%. The left ventricle has normal function. Left ventricular endocardial border not optimally defined to evaluate regional wall motion. The left ventricular internal cavity size was normal in size. Suboptimal image quality limits for assessment of left ventricular hypertrophy. Left ventricular diastolic parameters are indeterminate. Right Ventricle: The right ventricular size is not well visualized. Right vetricular wall thickness was not well visualized. Right ventricular systolic function was not well visualized. Left Atrium: Left atrial size was not well visualized. Right Atrium: Right atrial size was not well visualized. Pericardium: There is no evidence of pericardial effusion. Mitral Valve: The mitral valve was not well visualized. No evidence of mitral valve regurgitation. No evidence of mitral valve stenosis. Tricuspid Valve: The tricuspid valve is not well visualized. Tricuspid valve regurgitation is not demonstrated. No evidence of tricuspid stenosis. Aortic Valve: The aortic valve has an indeterminant number of cusps. Aortic valve regurgitation is not visualized. No aortic stenosis is present. Pulmonic Valve: The pulmonic valve  was not well visualized. Pulmonic valve regurgitation is not visualized. No evidence of pulmonic stenosis. Aorta: The aortic root was not well visualized. IAS/Shunts: The interatrial septum was not well visualized.  LEFT VENTRICLE PLAX 2D LVIDd:         3.07 cm LVIDs:         1.93 cm LV PW:         2.62 cm LV IVS:        1.54 cm LVOT diam:     2.10 cm LVOT Area:     3.46 cm  LEFT ATRIUM         Index LA diam:    3.30 cm 1.39 cm/m   AORTA Ao Root diam: 3.70 cm  SHUNTS Systemic Diam: 2.10 cm Caron Poser Electronically signed by Caron Poser Signature Date/Time: 10/02/2024/2:07:14 PM    Final    PERIPHERAL VASCULAR CATHETERIZATION Result Date: 10/01/2024 See surgical note for result.  CT ABDOMEN PELVIS WO CONTRAST Result Date: 09/30/2024 EXAM: CT ABDOMEN AND PELVIS WITHOUT CONTRAST 09/30/2024 02:32:43 AM TECHNIQUE: CT of the abdomen and pelvis was performed without the administration of intravenous contrast. Multiplanar reformatted images are provided for review. Automated exposure control, iterative reconstruction, and/or weight-based adjustment of the mA/kV was utilized to reduce the radiation dose to as low as reasonably achievable. COMPARISON: 05/11/2024 CLINICAL HISTORY: Abdominal pain, acute, nonlocalized. FINDINGS: LOWER CHEST: Small bilateral pleural effusions. Bilateral rounded lower lobe airspace opacities could reflect rounded atelectasis or pneumonia. LIVER: The liver is unremarkable. GALLBLADDER AND BILE DUCTS: Gallbladder is unremarkable. No biliary ductal dilatation. SPLEEN: No acute abnormality. PANCREAS: No acute abnormality. ADRENAL GLANDS: No acute abnormality. KIDNEYS, URETERS AND BLADDER: No stones in the kidneys or ureters. No hydronephrosis. No perinephric or periureteral stranding. Suprapubic Foley catheter in place with urinary bladder decompressed. GI AND BOWEL: Stomach demonstrates no acute abnormality. Rectosigmoid wall thickening concerning for proctocolitis. No bowel obstruction.  Normal appendix. PERITONEUM AND RETROPERITONEUM: No ascites. No free air. VASCULATURE: Aorta is normal in caliber. Aortic atherosclerosis. LYMPH NODES: No lymphadenopathy. REPRODUCTIVE ORGANS: No acute abnormality. BONES AND SOFT TISSUES: Postoperative changes in the thoracic spine. Stable mild chronic compression fracture at T12 and L1. Umbilical hernia containing fat, stable. No focal soft tissue abnormality. IMPRESSION: 1. Rectosigmoid wall thickening concerning for proctocolitis. No bowel obstruction. Normal  appendix. 2. Small bilateral pleural effusions. 3. Bilateral rounded lower lobe airspace opacities, possibly representing rounded atelectasis or pneumonia. Electronically signed by: Franky Crease MD 09/30/2024 02:45 AM EST RP Workstation: [id number]   DG Abd 2 Views Result Date: 09/29/2024 CLINICAL DATA:  Abdominal pain EXAM: DG ABDOMEN 2V COMPARISON:  05/12/2024 FINDINGS: Extensive hardware in the thoracic spine. Left greater than right pleural effusions and basilar airspace disease. Multiple moderate air-filled dilated loops of small bowel measuring up to 4.2 cm. IMPRESSION: 1. Moderate diffuse air distension of the bowel, potentially due to ileus with partial or developing bowel obstruction not excluded. Consider radiographic follow-up. 2. Left greater than right pleural effusions and basilar airspace disease. Electronically Signed   By: Luke Bun M.D.   On: 09/29/2024 17:00   DG Chest Port 1 View Result Date: 09/29/2024 CLINICAL DATA:  Desaturation EXAM: PORTABLE CHEST 1 VIEW COMPARISON:  08/03/2024 FINDINGS: Right-sided central venous catheter tip at the cavoatrial region. Posterior spinal fusion hardware and interbody cage device. Cardiomegaly with vascular congestion and suspected mild interstitial edema. Left greater than right pleural effusions. IMPRESSION: Cardiomegaly with vascular congestion and suspected mild interstitial edema. Left greater than right pleural effusions. Electronically  Signed   By: Luke Bun M.D.   On: 09/29/2024 15:21    Microbiology: Results for orders placed or performed during the hospital encounter of 09/29/24  Resp panel by RT-PCR (RSV, Flu A&B, Covid) Anterior Nasal Swab     Status: None   Collection Time: 09/29/24  2:16 PM   Specimen: Anterior Nasal Swab  Result Value Ref Range Status   SARS Coronavirus 2 by RT PCR NEGATIVE NEGATIVE Final    Comment: (NOTE) SARS-CoV-2 target nucleic acids are NOT DETECTED.  The SARS-CoV-2 RNA is generally detectable in upper respiratory specimens during the acute phase of infection. The lowest concentration of SARS-CoV-2 viral copies this assay can detect is 138 copies/mL. A negative result does not preclude SARS-Cov-2 infection and should not be used as the sole basis for treatment or other patient management decisions. A negative result may occur with  improper specimen collection/handling, submission of specimen other than nasopharyngeal swab, presence of viral mutation(s) within the areas targeted by this assay, and inadequate number of viral copies(<138 copies/mL). A negative result must be combined with clinical observations, patient history, and epidemiological information. The expected result is Negative.  Fact Sheet for Patients:  bloggercourse.com  Fact Sheet for Healthcare Providers:  seriousbroker.it  This test is no t yet approved or cleared by the [place]  FDA and  has been authorized for detection and/or diagnosis of SARS-CoV-2 by FDA under an Emergency Use Authorization (EUA). This EUA will remain  in effect (meaning this test can be used) for the duration of the COVID-19 declaration under Section 564(b)(1) of the Act, 21 U.S.C.section 360bbb-3(b)(1), unless the authorization is terminated  or revoked sooner.       Influenza A by PCR NEGATIVE NEGATIVE Final   Influenza B by PCR NEGATIVE NEGATIVE Final    Comment:  (NOTE) The Xpert Xpress SARS-CoV-2/FLU/RSV plus assay is intended as an aid in the diagnosis of influenza from Nasopharyngeal swab specimens and should not be used as a sole basis for treatment. Nasal washings and aspirates are unacceptable for Xpert Xpress SARS-CoV-2/FLU/RSV testing.  Fact Sheet for Patients: bloggercourse.com  Fact Sheet for Healthcare Providers: seriousbroker.it  This test is not yet approved or cleared by the [place]  FDA and has been authorized for detection and/or diagnosis of SARS-CoV-2 by FDA  under an Emergency Use Authorization (EUA). This EUA will remain in effect (meaning this test can be used) for the duration of the COVID-19 declaration under Section 564(b)(1) of the Act, 21 U.S.C. section 360bbb-3(b)(1), unless the authorization is terminated or revoked.     Resp Syncytial Virus by PCR NEGATIVE NEGATIVE Final    Comment: (NOTE) Fact Sheet for Patients: bloggercourse.com  Fact Sheet for Healthcare Providers: seriousbroker.it  This test is not yet approved or cleared by the [place]  FDA and has been authorized for detection and/or diagnosis of SARS-CoV-2 by FDA under an Emergency Use Authorization (EUA). This EUA will remain in effect (meaning this test can be used) for the duration of the COVID-19 declaration under Section 564(b)(1) of the Act, 21 U.S.C. section 360bbb-3(b)(1), unless the authorization is terminated or revoked.  Performed at [facility], [address] [place], [place] [address]   Urine Culture     Status: Abnormal   Collection Time: 09/29/24  2:17 PM   Specimen: Urine, Random  Result Value Ref Range Status   Specimen Description   Final    URINE, RANDOM Performed at [facility], [address] [place], [place] [address]    Special Requests   Final    NONE Reflexed from [id number] Performed at  [facility], [address] [place], [place] [address]    Culture (A)  Final    >=100,000 COLONIES/mL PROTEUS MIRABILIS >=100,000 COLONIES/mL METHICILLIN RESISTANT STAPHYLOCOCCUS AUREUS    Report Status 10/02/2024 FINAL  Final   Organism ID, Bacteria PROTEUS MIRABILIS (A)  Final   Organism ID, Bacteria METHICILLIN RESISTANT STAPHYLOCOCCUS AUREUS (A)  Final      Susceptibility   Methicillin resistant staphylococcus aureus - MIC*    CIPROFLOXACIN  >=8 RESISTANT Resistant     GENTAMICIN <=0.5 SENSITIVE Sensitive     NITROFURANTOIN <=16 SENSITIVE Sensitive     OXACILLIN >=4 RESISTANT Resistant     TETRACYCLINE <=1 SENSITIVE Sensitive     VANCOMYCIN  1 SENSITIVE Sensitive     TRIMETH /SULFA  <=10 SENSITIVE Sensitive     RIFAMPIN  <=0.5 SENSITIVE Sensitive     Inducible Clindamycin NEGATIVE Sensitive     LINEZOLID  2 SENSITIVE Sensitive     * >=100,000 COLONIES/mL METHICILLIN RESISTANT STAPHYLOCOCCUS AUREUS   Proteus mirabilis - MIC*    AMPICILLIN <=2 SENSITIVE Sensitive     CEFAZOLIN  (URINE) Value in next row Sensitive      4 SENSITIVEThis is a modified FDA-approved test that has been validated and its performance characteristics determined by the reporting laboratory.  This laboratory is certified under the Clinical Laboratory Improvement Amendments CLIA as qualified to perform high complexity clinical laboratory testing.    CEFEPIME  Value in next row Sensitive      4 SENSITIVEThis is a modified FDA-approved test that has been validated and its performance characteristics determined by the reporting laboratory.  This laboratory is certified under the Clinical Laboratory Improvement Amendments CLIA as qualified to perform high complexity clinical laboratory testing.    ERTAPENEM Value in next row Sensitive      4 SENSITIVEThis is a modified FDA-approved test that has been validated and its performance characteristics determined by the reporting laboratory.  This laboratory is certified  under the Clinical Laboratory Improvement Amendments CLIA as qualified to perform high complexity clinical laboratory testing.    CEFTRIAXONE  Value in next row Sensitive      4 SENSITIVEThis is a modified FDA-approved test that has been validated and its performance characteristics determined by  the reporting laboratory.  This laboratory is certified under the Clinical Laboratory Improvement Amendments CLIA as qualified to perform high complexity clinical laboratory testing.    CIPROFLOXACIN  Value in next row Resistant      4 SENSITIVEThis is a modified FDA-approved test that has been validated and its performance characteristics determined by the reporting laboratory.  This laboratory is certified under the Clinical Laboratory Improvement Amendments CLIA as qualified to perform high complexity clinical laboratory testing.    GENTAMICIN Value in next row Sensitive      4 SENSITIVEThis is a modified FDA-approved test that has been validated and its performance characteristics determined by the reporting laboratory.  This laboratory is certified under the Clinical Laboratory Improvement Amendments CLIA as qualified to perform high complexity clinical laboratory testing.    NITROFURANTOIN Value in next row Resistant      4 SENSITIVEThis is a modified FDA-approved test that has been validated and its performance characteristics determined by the reporting laboratory.  This laboratory is certified under the Clinical Laboratory Improvement Amendments CLIA as qualified to perform high complexity clinical laboratory testing.    TRIMETH /SULFA  Value in next row Sensitive      4 SENSITIVEThis is a modified FDA-approved test that has been validated and its performance characteristics determined by the reporting laboratory.  This laboratory is certified under the Clinical Laboratory Improvement Amendments CLIA as qualified to perform high complexity clinical laboratory testing.    AMPICILLIN/SULBACTAM Value in next  row Sensitive      4 SENSITIVEThis is a modified FDA-approved test that has been validated and its performance characteristics determined by the reporting laboratory.  This laboratory is certified under the Clinical Laboratory Improvement Amendments CLIA as qualified to perform high complexity clinical laboratory testing.    PIP/TAZO Value in next row Sensitive      <=4 SENSITIVEThis is a modified FDA-approved test that has been validated and its performance characteristics determined by the reporting laboratory.  This laboratory is certified under the Clinical Laboratory Improvement Amendments CLIA as qualified to perform high complexity clinical laboratory testing.    MEROPENEM Value in next row Sensitive      <=4 SENSITIVEThis is a modified FDA-approved test that has been validated and its performance characteristics determined by the reporting laboratory.  This laboratory is certified under the Clinical Laboratory Improvement Amendments CLIA as qualified to perform high complexity clinical laboratory testing.    * >=100,000 COLONIES/mL PROTEUS MIRABILIS  Blood Culture (routine x 2)     Status: Abnormal   Collection Time: 09/29/24  2:37 PM   Specimen: BLOOD  Result Value Ref Range Status   Specimen Description   Final    BLOOD BLOOD RIGHT FOREARM Performed at [facility], [address], [place], [place] [address]    Special Requests   Final    BOTTLES DRAWN AEROBIC AND ANAEROBIC Blood Culture results may not be optimal due to an inadequate volume of blood received in culture bottles Performed at [facility], [address], [place], [place] [address]    Culture  Setup Time   Final    GRAM POSITIVE COCCI IN BOTH AEROBIC AND ANAEROBIC BOTTLES CRITICAL VALUE NOTED.  VALUE IS CONSISTENT WITH PREVIOUSLY REPORTED AND CALLED VALUE.    Culture (A)  Final    STAPHYLOCOCCUS AUREUS SUSCEPTIBILITIES PERFORMED ON PREVIOUS CULTURE WITHIN THE LAST 5 DAYS. Performed at [facility], [address], [place], [place] [address]    Report Status 10/02/2024 FINAL  Final  Blood Culture (routine x 2)     Status: Abnormal   Collection Time: 09/29/24  2:37 PM   Specimen: BLOOD  Result Value Ref Range Status   Specimen Description   Final    BLOOD LEFT ANTECUBITAL Performed at [facility], [address] [place], [place] [address]    Special Requests   Final    BOTTLES DRAWN AEROBIC AND ANAEROBIC Blood Culture results may not be optimal due to an inadequate volume of blood received in culture bottles Performed at [facility], [address] [place], [place] [address]    Culture  Setup Time   Final    GRAM POSITIVE COCCI IN BOTH AEROBIC AND ANAEROBIC BOTTLES CRITICAL RESULT CALLED TO, READ BACK BY AND VERIFIED WITH: PHARMD NATHAN B ON [id number] @0457  BY HNM    Culture (A)  Final    METHICILLIN RESISTANT STAPHYLOCOCCUS AUREUS SEE SEPARATE REPORT Performed at [facility], [address] [place], [place] [address]    Report Status 10/12/2024 FINAL  Final   Organism ID, Bacteria METHICILLIN RESISTANT STAPHYLOCOCCUS AUREUS  Final      Susceptibility   Methicillin resistant staphylococcus aureus - MIC*    CIPROFLOXACIN  >=8 RESISTANT Resistant     ERYTHROMYCIN  >=8 RESISTANT Resistant     GENTAMICIN <=0.5 SENSITIVE Sensitive     OXACILLIN >=4 RESISTANT Resistant     TETRACYCLINE <=1 SENSITIVE Sensitive     VANCOMYCIN  1 SENSITIVE Sensitive     TRIMETH /SULFA  <=10 SENSITIVE Sensitive     CLINDAMYCIN >=8 RESISTANT Resistant     RIFAMPIN  <=0.5 SENSITIVE Sensitive     Inducible Clindamycin NEGATIVE Sensitive     LINEZOLID  2 SENSITIVE Sensitive     * METHICILLIN RESISTANT STAPHYLOCOCCUS AUREUS  Blood Culture ID Panel (Reflexed)     Status: Abnormal   Collection Time: 09/29/24  2:37 PM  Result Value Ref Range Status   Enterococcus faecalis NOT DETECTED NOT DETECTED Final   Enterococcus Faecium NOT DETECTED NOT DETECTED Final   Listeria  monocytogenes NOT DETECTED NOT DETECTED Final   Staphylococcus species DETECTED (A) NOT DETECTED Final    Comment: CRITICAL RESULT CALLED TO, READ BACK BY AND VERIFIED WITHBETHA RANKIN DILLS PHARMD [id number] 09/30/24 HNM    Staphylococcus aureus (BCID) DETECTED (A) NOT DETECTED Final    Comment: Methicillin (oxacillin)-resistant Staphylococcus aureus (MRSA). MRSA is predictably resistant to beta-lactam antibiotics (except ceftaroline). Preferred therapy is vancomycin  unless clinically contraindicated. Patient requires contact precautions if  hospitalized. CRITICAL RESULT CALLED TO, READ BACK BY AND VERIFIED WITH: RANKIN DILLS PHARMD [id number] 09/30/24 HNM    Staphylococcus epidermidis NOT DETECTED NOT DETECTED Final   Staphylococcus lugdunensis NOT DETECTED NOT DETECTED Final   Streptococcus species NOT DETECTED NOT DETECTED Final   Streptococcus agalactiae NOT DETECTED NOT DETECTED Final   Streptococcus pneumoniae NOT DETECTED NOT DETECTED Final   Streptococcus pyogenes NOT DETECTED NOT DETECTED Final   A.calcoaceticus-baumannii NOT DETECTED NOT DETECTED Final   Bacteroides fragilis NOT DETECTED NOT DETECTED Final   Enterobacterales NOT DETECTED NOT DETECTED Final   Enterobacter cloacae complex NOT DETECTED NOT DETECTED Final   Escherichia coli NOT DETECTED NOT DETECTED Final   Klebsiella aerogenes NOT DETECTED NOT DETECTED Final   Klebsiella oxytoca NOT DETECTED NOT DETECTED Final   Klebsiella pneumoniae NOT DETECTED NOT DETECTED Final   Proteus species NOT DETECTED NOT DETECTED Final   Salmonella species NOT DETECTED NOT DETECTED Final   Serratia marcescens NOT DETECTED NOT DETECTED Final   Haemophilus influenzae NOT  DETECTED NOT DETECTED Final   Neisseria meningitidis NOT DETECTED NOT DETECTED Final   Pseudomonas aeruginosa NOT DETECTED NOT DETECTED Final   Stenotrophomonas maltophilia NOT DETECTED NOT DETECTED Final   Candida albicans NOT DETECTED NOT DETECTED Final   Candida auris NOT DETECTED  NOT DETECTED Final   Candida glabrata NOT DETECTED NOT DETECTED Final   Candida krusei NOT DETECTED NOT DETECTED Final   Candida parapsilosis NOT DETECTED NOT DETECTED Final   Candida tropicalis NOT DETECTED NOT DETECTED Final   Cryptococcus neoformans/gattii NOT DETECTED NOT DETECTED Final   Meth resistant mecA/C and MREJ DETECTED (A) NOT DETECTED Final    Comment: CRITICAL RESULT CALLED TO, READ BACK BY AND VERIFIED WITHBETHA RANKIN DILLS PHARMD [id number] 09/30/24 HNM Performed at [facility], [address] [place], [place] [address]   MIC (1 Drug)-     Status: Abnormal   Collection Time: 09/29/24  2:37 PM  Result Value Ref Range Status   Min Inhibitory Conc (1 Drug) Final report (A)  Corrected    Comment: (NOTE) Performed At: [facility] [address] [place], [place] [id number] Jennette Shorter MD Ey:[id number] CORRECTED ON 01/16 AT 0636: PREVIOUSLY REPORTED AS Preliminary report    Source BLOOD  Final    Comment: Performed at [facility], [address] [place], [place] [address]  MIC Result     Status: Abnormal   Collection Time: 09/29/24  2:37 PM  Result Value Ref Range Status   Result 1 (MIC) Comment (A)  Final    Comment: (NOTE) Methicillin - resistant Staphylococcus aureus Identification performed by account, not confirmed by this laboratory. DAPTOMYCIN     <=1 UG/ML = SUSCEPTIBLE Performed At: [facility] [address] [place], [place] [id number] Jennette Shorter MD Ey:[id number]   MRSA Next Gen by PCR, Nasal     Status: Abnormal   Collection Time: 09/30/24  5:00 PM   Specimen: Nasal Mucosa; Nasal Swab  Result Value Ref Range Status   MRSA by PCR Next Gen DETECTED (A) NOT DETECTED Final    Comment: RESULT CALLED TO, READ BACK BY AND VERIFIED WITH: TAMMY ORLANDO 2032 09/30/24 MU (NOTE) The GeneXpert MRSA Assay (FDA approved for NASAL specimens only), is one component of a comprehensive MRSA colonization surveillance program. It is not  intended to diagnose MRSA infection nor to guide or monitor treatment for MRSA infections. Test performance is not FDA approved in patients less than 41 years old. Performed at [facility], [address] [place], [place] [address]   Cath Tip Culture     Status: Abnormal   Collection Time: 10/01/24 11:03 AM   Specimen: Catheter Tip; Other  Result Value Ref Range Status   Specimen Description   Final    CATH TIP Performed at [facility], [address] [place], [place] [address]    Special Requests   Final    NONE Performed at [facility], [address] [place], [place] [address]    Culture METHICILLIN RESISTANT STAPHYLOCOCCUS AUREUS (A)  Final   Report Status 10/03/2024 FINAL  Final   Organism ID, Bacteria METHICILLIN RESISTANT STAPHYLOCOCCUS AUREUS  Final      Susceptibility   Methicillin resistant staphylococcus aureus - MIC*    CIPROFLOXACIN  >=8 RESISTANT Resistant     ERYTHROMYCIN  >=8 RESISTANT Resistant     GENTAMICIN <=0.5 SENSITIVE Sensitive     OXACILLIN >=4 RESISTANT Resistant     TETRACYCLINE <=1 SENSITIVE Sensitive     VANCOMYCIN  <=0.5  SENSITIVE Sensitive     TRIMETH /SULFA  <=10 SENSITIVE Sensitive     CLINDAMYCIN >=8 RESISTANT Resistant     RIFAMPIN  <=0.5 SENSITIVE Sensitive     Inducible Clindamycin NEGATIVE Sensitive     LINEZOLID  2 SENSITIVE Sensitive     * METHICILLIN RESISTANT STAPHYLOCOCCUS AUREUS  Culture, blood (Routine X 2) w Reflex to ID Panel     Status: None   Collection Time: 10/02/24  6:36 AM   Specimen: BLOOD  Result Value Ref Range Status   Specimen Description BLOOD BLOOD LEFT ARM  Final   Special Requests   Final    BOTTLES DRAWN AEROBIC AND ANAEROBIC Blood Culture adequate volume   Culture   Final    NO GROWTH 5 DAYS Performed at [facility], [address] [place], [place] [address]    Report Status 10/07/2024 FINAL  Final  Culture, blood (Routine X 2) w Reflex to ID Panel     Status: None    Collection Time: 10/02/24  6:36 AM   Specimen: BLOOD  Result Value Ref Range Status   Specimen Description BLOOD BLOOD LEFT ARM  Final   Special Requests   Final    BOTTLES DRAWN AEROBIC AND ANAEROBIC Blood Culture adequate volume   Culture   Final    NO GROWTH 5 DAYS Performed at [facility], [address] [place], [place] [address]    Report Status 10/07/2024 FINAL  Final  Body fluid culture w Gram Stain     Status: None (Preliminary result)   Collection Time: 10/13/24  2:58 PM   Specimen: PATH Cytology Pleural fluid  Result Value Ref Range Status   Specimen Description   Final    PLEURAL Performed at [facility], [address] [place], [place] [address]    Special Requests   Final    CYTO PLEU Performed at [facility], [address] [place], [place] [address]    Gram Stain   Final    NO WBC SEEN NO ORGANISMS SEEN Performed at [facility], [address], [place], [place] [address]    Culture PENDING  Incomplete   Report Status PENDING  Incomplete    Labs: CBC: Recent Labs  Lab 10/08/24 0527 10/09/24 1606 10/10/24 0650 10/13/24 0509  WBC 6.1 7.2 6.0 5.9  HGB 8.5* 8.3* 8.6* 8.1*  HCT 27.2* 26.1* 26.4* 25.6*  MCV 93.8 93.2 93.6 93.4  PLT 149* 198 200 202   Basic Metabolic Panel: Recent Labs  Lab 10/07/24 1430 10/08/24 0527 10/09/24 1606 10/10/24 0650 10/13/24 0509  NA 132* 132* 131* 130* 129*  K 4.7 4.3 4.3 4.7 4.9  CL 99 96* 95* 94* 93*  CO2 21* 25 25 24 25   GLUCOSE 189* 208* 109* 127* 185*  BUN 71* 43* 42* 51* 50*  CREATININE 6.45* 4.78* 4.83* 5.54* 4.85*  CALCIUM  7.7* 7.9* 8.2* 8.4* 8.0*  MG  --  1.9 2.1 1.9  --   PHOS 5.6* 4.2 4.1 5.3*  --    Liver Function Tests: Recent Labs  Lab 10/07/24 1430  ALBUMIN  2.5*   CBG: Recent Labs  Lab 10/12/24 2144 10/13/24 0940 10/13/24 1543 10/13/24 2126 10/14/24 0829  GLUCAP 179* 164* 186* 133* 231*    Discharge time spent: greater than 30  minutes.  Signed: Leita Blanch, MD [facility] 10/14/2024 "

## 2024-10-14 NOTE — Plan of Care (Signed)
   Problem: Metabolic: Goal: Ability to maintain appropriate glucose levels will improve Outcome: Progressing   Problem: Nutritional: Goal: Maintenance of adequate nutrition will improve Outcome: Progressing Goal: Progress toward achieving an optimal weight will improve Outcome: Progressing

## 2024-10-14 NOTE — Progress Notes (Signed)
 D/C order noted. Contacted [facility]      to be advised of pt and d/c today. Requested documents faxed to clinic for continuation of care.  Suzen Satchel Dialysis Navigator [phone]

## 2024-10-14 NOTE — Progress Notes (Signed)
 Hemodialysis Note:  Received patient in bed to unit. Alert and oriented. Informed consent singed and in chart.  Treatment initiated: 1410 Treatment completed: 1823  Access used: Left internal jugular catheter Access issues: unable to pulled blood before treatment. Cathflo instilled on each catheter. Cartridge changed due to system clot  Patient tolerated well. Transported back to room, alert without acute distress. Report given to patient's RN.  Total UF removed: 2 Liters Medications given: Retacrit  4000 units IV  Post HD weight: 123.7 kg  [name]

## 2024-10-14 NOTE — Progress Notes (Signed)
 OT Cancellation Note  Patient Details Name: Eugene [name] MRN: [id number] DOB: 09-Apr-1961   Cancelled Treatment:    Reason Eval/Treat Not Completed: Patient at procedure or test/ unavailable;Other (comment) (patient in HD, planned dc to LTC today after HD)  [name] Clause 10/14/2024, 2:11 PM

## 2024-10-14 NOTE — TOC Transition Note (Addendum)
 Transition of Care [facility]) - Discharge Note   Patient Details  Name: Eugene [name] MRN: [id number] Date of Birth: 1960/12/11  [facility]) CM/SW Contact:  Dalia GORMAN Fuse, RN Phone Number: 10/14/2024, 10:20 AM   Clinical Narrative:    The patient is medically clear to discharge to LTC at [facility]. [facility] to transport.  DC paperwork sent to [facility] in the HUB.   Nurse to call report to [phone]  RM 56 B.   The patient is on the way to dialysis at this time. Nurse spoke with [facility] and they confirmed it would be atleast 6 before they get to him for transport. He is 7th in line.  Final next level of care: Skilled Nursing Facility Barriers to Discharge: Barriers Resolved   Patient Goals and CMS Choice            Discharge Placement              Patient chooses bed at: [facility] Patient to be transferred to facility by: [facility]      Discharge Plan and Services Additional resources added to the After Visit Summary for                                       Social Drivers of Health (SDOH) Interventions SDOH Screenings   Food Insecurity: No Food Insecurity (09/29/2024)  Housing: Low Risk (09/29/2024)  Transportation Needs: No Transportation Needs (09/29/2024)  Utilities: Not At Risk (09/29/2024)  Social Connections: Socially Isolated (09/29/2024)  Tobacco Use: Medium Risk (09/29/2024)     Readmission Risk Interventions    05/05/2024    3:05 PM  Readmission Risk Prevention Plan  Transportation Screening Complete  Medication Review (RN Care Manager) Complete  PCP or Specialist appointment within 3-5 days of discharge Complete  SW Recovery Care/Counseling Consult Complete  Palliative Care Screening Not Applicable  Skilled Nursing Facility Complete

## 2024-10-14 NOTE — Plan of Care (Signed)
  Problem: Education: Goal: Ability to describe self-care measures that may prevent or decrease complications (Diabetes Survival Skills Education) will improve Outcome: Progressing Goal: Individualized Educational Video(s) Outcome: Progressing   Problem: Coping: Goal: Ability to adjust to condition or change in health will improve Outcome: Progressing   Problem: Fluid Volume: Goal: Ability to maintain a balanced intake and output will improve Outcome: Progressing   Problem: Health Behavior/Discharge Planning: Goal: Ability to identify and utilize available resources and services will improve Outcome: Progressing Goal: Ability to manage health-related needs will improve Outcome: Progressing   Problem: Metabolic: Goal: Ability to maintain appropriate glucose levels will improve Outcome: Progressing   Problem: Nutritional: Goal: Maintenance of adequate nutrition will improve Outcome: Progressing Goal: Progress toward achieving an optimal weight will improve Outcome: Progressing   Problem: Skin Integrity: Goal: Risk for impaired skin integrity will decrease Outcome: Progressing   Problem: Tissue Perfusion: Goal: Adequacy of tissue perfusion will improve Outcome: Progressing   Problem: Education: Goal: Knowledge of General Education information will improve Description: Including pain rating scale, medication(s)/side effects and non-pharmacologic comfort measures Outcome: Progressing   Problem: Health Behavior/Discharge Planning: Goal: Ability to manage health-related needs will improve Outcome: Progressing   Problem: Clinical Measurements: Goal: Ability to maintain clinical measurements within normal limits will improve Outcome: Progressing Goal: Will remain free from infection Outcome: Progressing Goal: Diagnostic test results will improve Outcome: Progressing Goal: Respiratory complications will improve Outcome: Progressing Goal: Cardiovascular complication will  be avoided Outcome: Progressing   Problem: Activity: Goal: Risk for activity intolerance will decrease Outcome: Progressing   Problem: Nutrition: Goal: Adequate nutrition will be maintained Outcome: Progressing   Problem: Coping: Goal: Level of anxiety will decrease Outcome: Progressing   Problem: Elimination: Goal: Will not experience complications related to bowel motility Outcome: Progressing Goal: Will not experience complications related to urinary retention Outcome: Progressing   Problem: Pain Managment: Goal: General experience of comfort will improve and/or be controlled Outcome: Progressing

## 2024-10-15 DIAGNOSIS — R7881 Bacteremia: Secondary | ICD-10-CM | POA: Diagnosis not present

## 2024-10-15 DIAGNOSIS — N186 End stage renal disease: Secondary | ICD-10-CM | POA: Diagnosis not present

## 2024-10-15 DIAGNOSIS — I471 Supraventricular tachycardia, unspecified: Secondary | ICD-10-CM | POA: Diagnosis not present

## 2024-10-15 DIAGNOSIS — E1165 Type 2 diabetes mellitus with hyperglycemia: Secondary | ICD-10-CM | POA: Diagnosis not present

## 2024-10-15 LAB — PROTEIN, BODY FLUID (OTHER): Total Protein, Body Fluid Other: 1.4 g/dL

## 2024-10-15 LAB — GLUCOSE, CAPILLARY: Glucose-Capillary: 108 mg/dL — ABNORMAL HIGH (ref 70–99)

## 2024-10-15 MED ORDER — EPOETIN ALFA-EPBX 10000 UNIT/ML IJ SOLN: 10000.0000 [IU] | INTRAMUSCULAR | Status: DC

## 2024-10-15 NOTE — Progress Notes (Signed)
 " [facility]  ROUNDING NOTE   Subjective:   Eugene [name] is a 64 year old male with past medical conditions including diabetes, hepatitis C, bilateral BKA, anxiety and depression, and end-stage renal disease on hemodialysis.  Patient presents to the emergency department from his facility complaining of shortness of breath.  Patient is currently admitted for SIRS (systemic inflammatory response syndrome) (HCC) [R65.10]  Patient is known to our practice and receives outpatient dialysis treatments at [facility] on a MWF schedule, supervised by Dr. Dennise.    Update: Patient seen sitting up in bed Completed breakfast tray at bedside Currently on 6 L nasal cannula No signs of distress Occasional cough present   Objective:  Vital signs in last 24 hours:  Temp:  [97.8 F (36.6 C)-98.8 F (37.1 C)] 98.4 F (36.9 C) (01/22 0456) Pulse Rate:  [67-78] 70 (01/22 0456) Resp:  [8-22] 18 (01/22 0456) BP: (103-132)/(54-66) 128/59 (01/22 0456) SpO2:  [90 %-97 %] 95 % (01/22 0456) Weight:  [123.7 kg-125.7 kg] 123.7 kg (01/21 1823)  Weight change:  Filed Weights   10/12/24 1700 10/14/24 1207 10/14/24 1823  Weight: 125.5 kg 125.7 kg 123.7 kg    Intake/Output: I/O last 3 completed shifts: In: 480 [P.O.:480] Out: 3775 [Urine:1775; Other:2000]   Intake/Output this shift:  No intake/output data recorded.  Physical Exam: General: NAD  Head: Normocephalic  Eyes: Anicteric  Lungs:  Diminished, 6 L HFNC  Heart: Regular rate and rhythm  Abdomen:  Soft, nontender  Extremities: No edema.  Bilateral BKA  Neurologic: Awake, alert, conversant  Skin: Warm,dry, no rash  Access: Lt internal jugular permcath    Basic Metabolic Panel: Recent Labs  Lab 10/09/24 1606 10/10/24 0650 10/13/24 0509 10/14/24 1420  NA 131* 130* 129* 128*  K 4.3 4.7 4.9 4.8  CL 95* 94* 93* 93*  CO2 25 24 25 24   GLUCOSE 109* 127* 185* 144*  BUN 42* 51* 50* 57*  CREATININE 4.83* 5.54* 4.85*  5.08*  CALCIUM  8.2* 8.4* 8.0* 7.9*  MG 2.1 1.9  --   --   PHOS 4.1 5.3*  --  9.7*    Liver Function Tests: Recent Labs  Lab 10/14/24 1420  ALBUMIN  2.7*   No results for input(s): LIPASE, AMYLASE in the last 168 hours.  No results for input(s): AMMONIA in the last 168 hours.  CBC: Recent Labs  Lab 10/09/24 1606 10/10/24 0650 10/13/24 0509 10/14/24 1420  WBC 7.2 6.0 5.9 6.8  HGB 8.3* 8.6* 8.1* 7.9*  HCT 26.1* 26.4* 25.6* 24.4*  MCV 93.2 93.6 93.4 92.4  PLT 198 200 202 205    Cardiac Enzymes: No results for input(s): CKTOTAL, CKMB, CKMBINDEX, TROPONINI in the last 168 hours.  BNP: Invalid input(s): POCBNP  CBG: Recent Labs  Lab 10/13/24 1543 10/13/24 2126 10/14/24 0829 10/14/24 2146 10/15/24 0734  GLUCAP 186* 133* 231* 181* 108*    Microbiology: Results for orders placed or performed during the hospital encounter of 09/29/24  Resp panel by RT-PCR (RSV, Flu A&B, Covid) Anterior Nasal Swab     Status: None   Collection Time: 09/29/24  2:16 PM   Specimen: Anterior Nasal Swab  Result Value Ref Range Status   SARS Coronavirus 2 by RT PCR NEGATIVE NEGATIVE Final    Comment: (NOTE) SARS-CoV-2 target nucleic acids are NOT DETECTED.  The SARS-CoV-2 RNA is generally detectable in upper respiratory specimens during the acute phase of infection. The lowest concentration of SARS-CoV-2 viral copies this assay can detect is 138 copies/mL.  A negative result does not preclude SARS-Cov-2 infection and should not be used as the sole basis for treatment or other patient management decisions. A negative result may occur with  improper specimen collection/handling, submission of specimen other than nasopharyngeal swab, presence of viral mutation(s) within the areas targeted by this assay, and inadequate number of viral copies(<138 copies/mL). A negative result must be combined with clinical observations, patient history, and epidemiological information. The  expected result is Negative.  Fact Sheet for Patients:  bloggercourse.com  Fact Sheet for Healthcare Providers:  seriousbroker.it  This test is no t yet approved or cleared by the [place]  FDA and  has been authorized for detection and/or diagnosis of SARS-CoV-2 by FDA under an Emergency Use Authorization (EUA). This EUA will remain  in effect (meaning this test can be used) for the duration of the COVID-19 declaration under Section 564(b)(1) of the Act, 21 U.S.C.section 360bbb-3(b)(1), unless the authorization is terminated  or revoked sooner.       Influenza A by PCR NEGATIVE NEGATIVE Final   Influenza B by PCR NEGATIVE NEGATIVE Final    Comment: (NOTE) The Xpert Xpress SARS-CoV-2/FLU/RSV plus assay is intended as an aid in the diagnosis of influenza from Nasopharyngeal swab specimens and should not be used as a sole basis for treatment. Nasal washings and aspirates are unacceptable for Xpert Xpress SARS-CoV-2/FLU/RSV testing.  Fact Sheet for Patients: bloggercourse.com  Fact Sheet for Healthcare Providers: seriousbroker.it  This test is not yet approved or cleared by the [place]  FDA and has been authorized for detection and/or diagnosis of SARS-CoV-2 by FDA under an Emergency Use Authorization (EUA). This EUA will remain in effect (meaning this test can be used) for the duration of the COVID-19 declaration under Section 564(b)(1) of the Act, 21 U.S.C. section 360bbb-3(b)(1), unless the authorization is terminated or revoked.     Resp Syncytial Virus by PCR NEGATIVE NEGATIVE Final    Comment: (NOTE) Fact Sheet for Patients: bloggercourse.com  Fact Sheet for Healthcare Providers: seriousbroker.it  This test is not yet approved or cleared by the [place]  FDA and has been authorized for detection and/or  diagnosis of SARS-CoV-2 by FDA under an Emergency Use Authorization (EUA). This EUA will remain in effect (meaning this test can be used) for the duration of the COVID-19 declaration under Section 564(b)(1) of the Act, 21 U.S.C. section 360bbb-3(b)(1), unless the authorization is terminated or revoked.  Performed at [facility], [address] [place], [place] [address]   Urine Culture     Status: Abnormal   Collection Time: 09/29/24  2:17 PM   Specimen: Urine, Random  Result Value Ref Range Status   Specimen Description   Final    URINE, RANDOM Performed at [facility], [address] [place], [place] [address]    Special Requests   Final    NONE Reflexed from [id number] Performed at [facility], [address] [place], [place] [address]    Culture (A)  Final    >=100,000 COLONIES/mL PROTEUS MIRABILIS >=100,000 COLONIES/mL METHICILLIN RESISTANT STAPHYLOCOCCUS AUREUS    Report Status 10/02/2024 FINAL  Final   Organism ID, Bacteria PROTEUS MIRABILIS (A)  Final   Organism ID, Bacteria METHICILLIN RESISTANT STAPHYLOCOCCUS AUREUS (A)  Final      Susceptibility   Methicillin resistant staphylococcus aureus - MIC*    CIPROFLOXACIN  >=8 RESISTANT Resistant     GENTAMICIN <=0.5 SENSITIVE Sensitive     NITROFURANTOIN <=16 SENSITIVE Sensitive     OXACILLIN >=  4 RESISTANT Resistant     TETRACYCLINE <=1 SENSITIVE Sensitive     VANCOMYCIN  1 SENSITIVE Sensitive     TRIMETH /SULFA  <=10 SENSITIVE Sensitive     RIFAMPIN  <=0.5 SENSITIVE Sensitive     Inducible Clindamycin NEGATIVE Sensitive     LINEZOLID  2 SENSITIVE Sensitive     * >=100,000 COLONIES/mL METHICILLIN RESISTANT STAPHYLOCOCCUS AUREUS   Proteus mirabilis - MIC*    AMPICILLIN <=2 SENSITIVE Sensitive     CEFAZOLIN  (URINE) Value in next row Sensitive      4 SENSITIVEThis is a modified FDA-approved test that has been validated and its performance characteristics determined by the reporting laboratory.   This laboratory is certified under the Clinical Laboratory Improvement Amendments CLIA as qualified to perform high complexity clinical laboratory testing.    CEFEPIME  Value in next row Sensitive      4 SENSITIVEThis is a modified FDA-approved test that has been validated and its performance characteristics determined by the reporting laboratory.  This laboratory is certified under the Clinical Laboratory Improvement Amendments CLIA as qualified to perform high complexity clinical laboratory testing.    ERTAPENEM Value in next row Sensitive      4 SENSITIVEThis is a modified FDA-approved test that has been validated and its performance characteristics determined by the reporting laboratory.  This laboratory is certified under the Clinical Laboratory Improvement Amendments CLIA as qualified to perform high complexity clinical laboratory testing.    CEFTRIAXONE  Value in next row Sensitive      4 SENSITIVEThis is a modified FDA-approved test that has been validated and its performance characteristics determined by the reporting laboratory.  This laboratory is certified under the Clinical Laboratory Improvement Amendments CLIA as qualified to perform high complexity clinical laboratory testing.    CIPROFLOXACIN  Value in next row Resistant      4 SENSITIVEThis is a modified FDA-approved test that has been validated and its performance characteristics determined by the reporting laboratory.  This laboratory is certified under the Clinical Laboratory Improvement Amendments CLIA as qualified to perform high complexity clinical laboratory testing.    GENTAMICIN Value in next row Sensitive      4 SENSITIVEThis is a modified FDA-approved test that has been validated and its performance characteristics determined by the reporting laboratory.  This laboratory is certified under the Clinical Laboratory Improvement Amendments CLIA as qualified to perform high complexity clinical laboratory testing.    NITROFURANTOIN  Value in next row Resistant      4 SENSITIVEThis is a modified FDA-approved test that has been validated and its performance characteristics determined by the reporting laboratory.  This laboratory is certified under the Clinical Laboratory Improvement Amendments CLIA as qualified to perform high complexity clinical laboratory testing.    TRIMETH /SULFA  Value in next row Sensitive      4 SENSITIVEThis is a modified FDA-approved test that has been validated and its performance characteristics determined by the reporting laboratory.  This laboratory is certified under the Clinical Laboratory Improvement Amendments CLIA as qualified to perform high complexity clinical laboratory testing.    AMPICILLIN/SULBACTAM Value in next row Sensitive      4 SENSITIVEThis is a modified FDA-approved test that has been validated and its performance characteristics determined by the reporting laboratory.  This laboratory is certified under the Clinical Laboratory Improvement Amendments CLIA as qualified to perform high complexity clinical laboratory testing.    PIP/TAZO Value in next row Sensitive      <=4 SENSITIVEThis is a modified FDA-approved test that has been validated and  its performance characteristics determined by the reporting laboratory.  This laboratory is certified under the Clinical Laboratory Improvement Amendments CLIA as qualified to perform high complexity clinical laboratory testing.    MEROPENEM Value in next row Sensitive      <=4 SENSITIVEThis is a modified FDA-approved test that has been validated and its performance characteristics determined by the reporting laboratory.  This laboratory is certified under the Clinical Laboratory Improvement Amendments CLIA as qualified to perform high complexity clinical laboratory testing.    * >=100,000 COLONIES/mL PROTEUS MIRABILIS  Blood Culture (routine x 2)     Status: Abnormal   Collection Time: 09/29/24  2:37 PM   Specimen: BLOOD  Result Value Ref Range  Status   Specimen Description   Final    BLOOD BLOOD RIGHT FOREARM Performed at [facility], [address], [place], [place] [address]    Special Requests   Final    BOTTLES DRAWN AEROBIC AND ANAEROBIC Blood Culture results may not be optimal due to an inadequate volume of blood received in culture bottles Performed at [facility], [address], [place], [place] [address]    Culture  Setup Time   Final    GRAM POSITIVE COCCI IN BOTH AEROBIC AND ANAEROBIC BOTTLES CRITICAL VALUE NOTED.  VALUE IS CONSISTENT WITH PREVIOUSLY REPORTED AND CALLED VALUE.    Culture (A)  Final    STAPHYLOCOCCUS AUREUS SUSCEPTIBILITIES PERFORMED ON PREVIOUS CULTURE WITHIN THE LAST 5 DAYS. Performed at [facility], [address] [place], [place] [address]    Report Status 10/02/2024 FINAL  Final  Blood Culture (routine x 2)     Status: Abnormal   Collection Time: 09/29/24  2:37 PM   Specimen: BLOOD  Result Value Ref Range Status   Specimen Description   Final    BLOOD LEFT ANTECUBITAL Performed at [facility], [address] [place], [place] [address]    Special Requests   Final    BOTTLES DRAWN AEROBIC AND ANAEROBIC Blood Culture results may not be optimal due to an inadequate volume of blood received in culture bottles Performed at [facility], [address] [place], [place] [address]    Culture  Setup Time   Final    GRAM POSITIVE COCCI IN BOTH AEROBIC AND ANAEROBIC BOTTLES CRITICAL RESULT CALLED TO, READ BACK BY AND VERIFIED WITH: PHARMD NATHAN B ON [id number] @0457  BY HNM    Culture (A)  Final    METHICILLIN RESISTANT STAPHYLOCOCCUS AUREUS SEE SEPARATE REPORT Performed at [facility], [address], [place], [place] [address]    Report Status 10/12/2024 FINAL  Final   Organism ID, Bacteria METHICILLIN RESISTANT STAPHYLOCOCCUS AUREUS  Final      Susceptibility   Methicillin resistant staphylococcus aureus - MIC*    CIPROFLOXACIN  >=8  RESISTANT Resistant     ERYTHROMYCIN  >=8 RESISTANT Resistant     GENTAMICIN <=0.5 SENSITIVE Sensitive     OXACILLIN >=4 RESISTANT Resistant     TETRACYCLINE <=1 SENSITIVE Sensitive     VANCOMYCIN  1 SENSITIVE Sensitive     TRIMETH /SULFA  <=10 SENSITIVE Sensitive     CLINDAMYCIN >=8 RESISTANT Resistant     RIFAMPIN  <=0.5 SENSITIVE Sensitive     Inducible Clindamycin NEGATIVE Sensitive     LINEZOLID  2 SENSITIVE Sensitive     * METHICILLIN RESISTANT STAPHYLOCOCCUS AUREUS  Blood Culture ID Panel (Reflexed)     Status: Abnormal   Collection Time: 09/29/24  2:37 PM  Result Value Ref  Range Status   Enterococcus faecalis NOT DETECTED NOT DETECTED Final   Enterococcus Faecium NOT DETECTED NOT DETECTED Final   Listeria monocytogenes NOT DETECTED NOT DETECTED Final   Staphylococcus species DETECTED (A) NOT DETECTED Final    Comment: CRITICAL RESULT CALLED TO, READ BACK BY AND VERIFIED WITHBETHA RANKIN DILLS PHARMD [id number] 09/30/24 HNM    Staphylococcus aureus (BCID) DETECTED (A) NOT DETECTED Final    Comment: Methicillin (oxacillin)-resistant Staphylococcus aureus (MRSA). MRSA is predictably resistant to beta-lactam antibiotics (except ceftaroline). Preferred therapy is vancomycin  unless clinically contraindicated. Patient requires contact precautions if  hospitalized. CRITICAL RESULT CALLED TO, READ BACK BY AND VERIFIED WITH: RANKIN DILLS PHARMD [id number] 09/30/24 HNM    Staphylococcus epidermidis NOT DETECTED NOT DETECTED Final   Staphylococcus lugdunensis NOT DETECTED NOT DETECTED Final   Streptococcus species NOT DETECTED NOT DETECTED Final   Streptococcus agalactiae NOT DETECTED NOT DETECTED Final   Streptococcus pneumoniae NOT DETECTED NOT DETECTED Final   Streptococcus pyogenes NOT DETECTED NOT DETECTED Final   A.calcoaceticus-baumannii NOT DETECTED NOT DETECTED Final   Bacteroides fragilis NOT DETECTED NOT DETECTED Final   Enterobacterales NOT DETECTED NOT DETECTED Final   Enterobacter cloacae  complex NOT DETECTED NOT DETECTED Final   Escherichia coli NOT DETECTED NOT DETECTED Final   Klebsiella aerogenes NOT DETECTED NOT DETECTED Final   Klebsiella oxytoca NOT DETECTED NOT DETECTED Final   Klebsiella pneumoniae NOT DETECTED NOT DETECTED Final   Proteus species NOT DETECTED NOT DETECTED Final   Salmonella species NOT DETECTED NOT DETECTED Final   Serratia marcescens NOT DETECTED NOT DETECTED Final   Haemophilus influenzae NOT DETECTED NOT DETECTED Final   Neisseria meningitidis NOT DETECTED NOT DETECTED Final   Pseudomonas aeruginosa NOT DETECTED NOT DETECTED Final   Stenotrophomonas maltophilia NOT DETECTED NOT DETECTED Final   Candida albicans NOT DETECTED NOT DETECTED Final   Candida auris NOT DETECTED NOT DETECTED Final   Candida glabrata NOT DETECTED NOT DETECTED Final   Candida krusei NOT DETECTED NOT DETECTED Final   Candida parapsilosis NOT DETECTED NOT DETECTED Final   Candida tropicalis NOT DETECTED NOT DETECTED Final   Cryptococcus neoformans/gattii NOT DETECTED NOT DETECTED Final   Meth resistant mecA/C and MREJ DETECTED (A) NOT DETECTED Final    Comment: CRITICAL RESULT CALLED TO, READ BACK BY AND VERIFIED WITHBETHA RANKIN DILLS PHARMD [id number] 09/30/24 HNM Performed at [facility], [address] [place], [place] [address]   MIC (1 Drug)-     Status: Abnormal   Collection Time: 09/29/24  2:37 PM  Result Value Ref Range Status   Min Inhibitory Conc (1 Drug) Final report (A)  Corrected    Comment: (NOTE) Performed At: [facility] [address] [place], [place] [id number] Jennette Shorter MD Ey:[id number] CORRECTED ON 01/16 AT 0636: PREVIOUSLY REPORTED AS Preliminary report    Source BLOOD  Final    Comment: Performed at [facility], [address], [place], [place] [address]  MIC Result     Status: Abnormal   Collection Time: 09/29/24  2:37 PM  Result Value Ref Range Status   Result 1 (MIC) Comment (A)  Final    Comment:  (NOTE) Methicillin - resistant Staphylococcus aureus Identification performed by account, not confirmed by this laboratory. DAPTOMYCIN     <=1 UG/ML = SUSCEPTIBLE Performed At: [facility] [address] [place], [place] [id number] Jennette Shorter MD Ey:[id number]   MRSA Next Gen by PCR, Nasal     Status: Abnormal   Collection Time: 09/30/24  5:00 PM   Specimen: Nasal Mucosa; Nasal Swab  Result Value Ref Range Status   MRSA by PCR Next Gen DETECTED (A) NOT DETECTED Final    Comment: RESULT CALLED TO, READ BACK BY AND VERIFIED WITH: TAMMY ORLANDO 2032 09/30/24 MU (NOTE) The GeneXpert MRSA Assay (FDA approved for NASAL specimens only), is one component of a comprehensive MRSA colonization surveillance program. It is not intended to diagnose MRSA infection nor to guide or monitor treatment for MRSA infections. Test performance is not FDA approved in patients less than 64 years old. Performed at [facility], [address], [place], [place] [address]   Cath Tip Culture     Status: Abnormal   Collection Time: 10/01/24 11:03 AM   Specimen: Catheter Tip; Other  Result Value Ref Range Status   Specimen Description   Final    CATH TIP Performed at [facility], [address] [place], [place] [address]    Special Requests   Final    NONE Performed at [facility], [address] [place], [place] [address]    Culture METHICILLIN RESISTANT STAPHYLOCOCCUS AUREUS (A)  Final   Report Status 10/03/2024 FINAL  Final   Organism ID, Bacteria METHICILLIN RESISTANT STAPHYLOCOCCUS AUREUS  Final      Susceptibility   Methicillin resistant staphylococcus aureus - MIC*    CIPROFLOXACIN  >=8 RESISTANT Resistant     ERYTHROMYCIN  >=8 RESISTANT Resistant     GENTAMICIN <=0.5 SENSITIVE Sensitive     OXACILLIN >=4 RESISTANT Resistant     TETRACYCLINE <=1 SENSITIVE Sensitive     VANCOMYCIN  <=0.5 SENSITIVE Sensitive     TRIMETH /SULFA  <=10 SENSITIVE Sensitive      CLINDAMYCIN >=8 RESISTANT Resistant     RIFAMPIN  <=0.5 SENSITIVE Sensitive     Inducible Clindamycin NEGATIVE Sensitive     LINEZOLID  2 SENSITIVE Sensitive     * METHICILLIN RESISTANT STAPHYLOCOCCUS AUREUS  Culture, blood (Routine X 2) w Reflex to ID Panel     Status: None   Collection Time: 10/02/24  6:36 AM   Specimen: BLOOD  Result Value Ref Range Status   Specimen Description BLOOD BLOOD LEFT ARM  Final   Special Requests   Final    BOTTLES DRAWN AEROBIC AND ANAEROBIC Blood Culture adequate volume   Culture   Final    NO GROWTH 5 DAYS Performed at [facility], [address] [place], [place] [address]    Report Status 10/07/2024 FINAL  Final  Culture, blood (Routine X 2) w Reflex to ID Panel     Status: None   Collection Time: 10/02/24  6:36 AM   Specimen: BLOOD  Result Value Ref Range Status   Specimen Description BLOOD BLOOD LEFT ARM  Final   Special Requests   Final    BOTTLES DRAWN AEROBIC AND ANAEROBIC Blood Culture adequate volume   Culture   Final    NO GROWTH 5 DAYS Performed at [facility], [address] [place], [place] [address]    Report Status 10/07/2024 FINAL  Final  Body fluid culture w Gram Stain     Status: None (Preliminary result)   Collection Time: 10/13/24  2:58 PM   Specimen: PATH Cytology Pleural fluid  Result Value Ref Range Status   Specimen Description   Final    PLEURAL Performed at [facility], [address], [place], [place] [address]    Special Requests   Final    CYTO PLEU Performed at [facility],  [address] [place], [place] [address]    Gram Stain NO WBC SEEN NO ORGANISMS SEEN   Final   Culture   Final    NO GROWTH 1 DAY Performed at [facility], [address] [place], [place] [address]    Report Status PENDING  Incomplete    Coagulation Studies: No results for input(s): LABPROT, INR in the last 72 hours.   Urinalysis: No results for input(s): COLORURINE,  LABSPEC, PHURINE, GLUCOSEU, HGBUR, BILIRUBINUR, KETONESUR, PROTEINUR, UROBILINOGEN, NITRITE, LEUKOCYTESUR in the last 72 hours.  Invalid input(s): APPERANCEUR     Imaging: DG Abd 1 View Result Date: 10/14/2024 EXAM: 1 VIEW XRAY OF THE ABDOMEN 10/14/2024 07:13:00 AM COMPARISON: Supine abdomen study yesterday at 9 o'clock am. CLINICAL HISTORY: [id number] Ileus [facility]) [phone] [phone] Ileus (HCC) [id number] Ileus (HCC) [id number] [phone] Ileus (HCC) [phone] FINDINGS: BOWEL: Nonobstructive bowel gas pattern with moderate fecal stasis. There is no supine evidence of free air. SOFT TISSUES: Small pleural effusions and overlying lung base opacities are present. BONES: Fusion hardware in the thoracic spine is again noted. No acute fracture. IMPRESSION: 1. Nonobstructive bowel gas pattern without supine evidence of pneumoperitoneum; moderate fecal stasis. Unchanged. 2. Small pleural effusions with overlying lung base opacities. Electronically signed by: Francis Quam MD 10/14/2024 07:34 AM EST RP Workstation: [id number]   DG Chest Port 1 View Result Date: 10/13/2024 CLINICAL DATA:  Shortness of breath EXAM: PORTABLE CHEST 1 VIEW COMPARISON:  10/13/2024, 10/11/2024, chest CT 10/06/2024 FINDINGS: Left-sided central venous catheter tip projecting over the brachiocephalic vein/SVC confluence. Extensive spinal hardware as before. Cardiomegaly with pleural effusions and left greater than right basilar airspace disease, not significantly changed. Background vascular congestion and pulmonary edema. No pneumothorax IMPRESSION: 1. Cardiomegaly with vascular congestion and pulmonary edema. 2. Small bilateral pleural effusions with left greater than right basilar airspace disease, not significantly changed. No pneumothorax is seen Electronically Signed   By: Luke Bun M.D.   On: 10/13/2024 19:36   US  THORACENTESIS LEFT ASP PLEURAL SPACE W/IMG GUIDE Result Date: 10/13/2024 INDICATION: Patient with a history of end-stage  renal disease on hemodialysis and heart failure presents today with left pleural effusion. Interventional Radiology asked to perform a diagnostic and therapeutic thoracentesis. EXAM: ULTRASOUND GUIDED THORACENTESIS MEDICATIONS: 1% lidocaine  10 COMPLICATIONS: None immediate. PROCEDURE: An ultrasound guided thoracentesis was thoroughly discussed with the patient and questions answered. The benefits, risks, alternatives and complications were also discussed. The patient understands and wishes to proceed with the procedure. Written consent was obtained. Ultrasound was performed to localize and mark an adequate pocket of fluid in the left chest. The area was then prepped and draped in the normal sterile fashion. 1% Lidocaine  was used for local anesthesia. Under ultrasound guidance a 6 Fr Safe-T-Centesis catheter was introduced. Thoracentesis was performed. The catheter was removed and a dressing applied. FINDINGS: A total of approximately 200 mL of clear yellow fluid was removed. Samples were sent to the laboratory as requested by the clinical team. IMPRESSION: Successful ultrasound guided left thoracentesis yielding 200 mL of pleural fluid. Procedure performed by: Warren Dais, NP Electronically Signed   By: Juliene Balder M.D.   On: 10/13/2024 15:51   DG Chest Port 1 View Result Date: 10/13/2024 CLINICAL DATA:  Status post thoracentesis. EXAM: PORTABLE CHEST 1 VIEW COMPARISON:  Chest radiograph dated 10/11/2024. FINDINGS: Left IJ catheter in similar position. Cardiomegaly with vascular congestion and edema relatively similar to prior radiograph. Small bilateral pleural effusions similar to prior radiograph. No pneumothorax. No acute  osseous pathology. Spinal hardware. IMPRESSION: No interval change.  No pneumothorax. Electronically Signed   By: Vanetta Chou M.D.   On: 10/13/2024 15:11     Medications:    anticoagulant sodium citrate      vancomycin  Stopped (10/15/24 0603)    bisacodyl   10 mg Oral QHS    calcitRIOL   0.5 mcg Oral Daily   carvedilol   6.25 mg Oral BID WC   Chlorhexidine  Gluconate Cloth  6 each Topical Q0600   cyanocobalamin   1,000 mcg Oral Daily   diltiazem   240 mg Oral QPM   doxepin   10 mg Oral QHS   epoetin  alfa-epbx (RETACRIT ) injection  4,000 Units Intravenous Q M,W,F-1800   famotidine   10 mg Oral Daily   folic acid   1 mg Oral Daily   gabapentin   300 mg Oral QHS   heparin   5,000 Units Subcutaneous Q8H   insulin  aspart  0-5 Units Subcutaneous QHS   insulin  aspart  0-6 Units Subcutaneous TID WC   insulin  aspart  9 Units Subcutaneous TID WC   insulin  glargine  22 Units Subcutaneous BID   ipratropium-albuterol   3 mL Nebulization BID   levothyroxine   175 mcg Oral QHS   lidocaine  (PF)  10 mL Other Once   linaclotide   145 mcg Oral QAC breakfast   loratadine   10 mg Oral Daily   nicotine   14 mg Transdermal Daily   pantoprazole   40 mg Oral Daily   polyethylene glycol  17 g Oral BID   senna-docusate  1 tablet Oral BID   simethicone   80 mg Oral QID   acetaminophen  **OR** acetaminophen , albuterol , anticoagulant sodium citrate , bisacodyl , guaiFENesin , liver oil-zinc  oxide, nicotine  polacrilex, ondansetron  **OR** ondansetron  (ZOFRAN ) IV, oxyCODONE   Assessment/ Plan:  [name] is a 64 y.o.  male with past medical conditions including diabetes, hepatitis C, bilateral BKA, anxiety and depression, and end-stage renal disease on hemodialysis.  Patient presents to the emergency department from his facility complaining of shortness of breath.  Patient is currently admitted for SIRS (systemic inflammatory response syndrome) (HCC) [R65.10]  [facility]/MWF/120.5 kg  End-stage renal disease on hemodialysis.        Due to infection, vascular removed right IJ PermCath on 1/8.  Received line free holiday. Vascular has placed lt internal jugular permcath.   Patient received dialysis yesterday with 2 L fluid removal.  Next treatment scheduled for Friday.   Anemia of  chronic kidney disease Lab Results  Component Value Date   HGB 7.9 (L) 10/14/2024   Hemoglobin 7.9, will increase to IV Retacrit  10,000 units with dialysis.  3.  Systemic inflammatory response syndrome, suspected due to fever, leukocytosis, and tachypnea on ED arrival.  Suspected source of suprapubic catheter. Abdominal x-ray shows moderate diffuse air distention of the bowel suspicious for ileus.  Blood culture positive for MRSA. Vascular removed PermCath on 1/8 and replaced on 1/12.  Continues to receive vancomycin , ordered per primary team.  ID consulted, recommends Vancomycin  1000mg  with dialysis until 11/11/24. Outpatient clinic notified  4. Secondary Hyperparathyroidism: with outpatient labs: None available  Lab Results  Component Value Date   PTH 177 (H) 04/23/2024   CALCIUM  7.9 (L) 10/14/2024   PHOS 9.7 (H) 10/14/2024   Hyperphosphatemia noted, will consider phosphate binders as outpatient.   LOS: 16 Avion Patella 1/22/202611:25 AM   "

## 2024-10-15 NOTE — Discharge Summary (Signed)
 " Physician Discharge Summary   Patient: Eugene [name] MRN: [id number] DOB: [birth year]  Admit date:     09/29/2024  Discharge date: 10/15/24  Discharge Physician: Leita Blanch   PCP: [place]   Recommendations at discharge:    F/u PCP at [facility] Video call appt with ID Dr Eugene [name] at 9:30 am on 11/03/24 F/u [facility] dr Dewane in 1-2 weeks  Discharge Diagnoses: Principal Problem:   SIRS (systemic inflammatory response syndrome) (HCC) Active Problems:   Abdominal discomfort   ESRD (end stage renal disease) (HCC)   Paroxysmal SVT (supraventricular tachycardia)   Heart failure with preserved ejection fraction (HCC)   Chronic respiratory failure with hypoxia (HCC)   Chronic bilateral pleural effusions   Chronic pain   Uncontrolled type 2 diabetes mellitus with hyperglycemia, with long-term current use of insulin  (HCC)   Chronic constipation   Obesity (BMI 30-39.9)   Hypothyroidism   MRSA bacteremia  Eugene [name] is a 64 y.o. male with medical history significant of anxiety, depression, type 2 diabetes mellitus, history of hepatitis C, end-stage renal disease on dialysis, bilateral BKA. He is having a fever. In the ER he was found to have an elevated white blood cell count and started on sepsis protocol.  Multiple admissions last year due to multiple co-morbidities. Poor historian.   Sepsis secondary to MRSA bacteremia Present on admission.  Sepsis criteria fever leukocytosis and tachycardia.  Now resolved.  MRSA positive in 4 out of 4 bottles. HD catheter removed 1/8 Afebrile overnight Repeat blood cultures no growth to date Continue vancomycin  --BC on admission positive for  MRSA  --CT of the chest given patient intermittently requiring 10 L of oxygen up from baseline of 6 L 1/13 CT Chest negative for PE, bilateral pleural effusion, mild to moderate.  Atelectasis -- Will likely need 6 weeks of antibiotics given likely cannot undergo TEE 1/14 cardiology consulted to eval for  TEE.  Cardiology recommended no TEE, risk outweigh the benefit.  Advised to continue antibiotics for longer duration.  ID is aware. As per ID, continue vancomycin  1250mg  IV with HD on MWF End date: 11/11/2024. Pt to f/I ID  on 11/03/24--video call appt   Ileus H/o chronic constipation Small and large bowel ileus 1/14 KUB: Diffusely gas-filled, although not overtly distended, small bowel and colon throughout abdomen and pelvis. nonobstructive bowel gas pattern. 1/7 CT a/p: notable for proctocolitis but no findings to suggest etiology of his nausea.   --Continue antiemetics, aggressive bowel regimen --KUB showed constipation --several small and moderate BM documented by RN --cont current extensive Bowel regimen. Tolerating po diet   Normocytic anemia  Thrombocytopenia improving Likely in settong of anemia of chronic disease and infection. H/o B12 deficiency Continue B12 supplementation, folate borderline, continue supplementation     ESRD (end stage renal disease) (HCC) Hemodialysis per nephrology --ok to d/c from nephrology standpoint after HD today and resume outpt HD --left internal jugular placed during hospital stay    Paroxysmal SVT (supraventricular tachycardia) On Cardizem  and Coreg    Heart failure with preserved ejection fraction (HCC) Dialysis to manage fluid    Bilateral pleural effusion 1/19 IR consulted for thoracentesis, effusion Left >Right but could not drain, no safe window.  1/20 IR-- Successful ultrasound guided left thoracentesis yielding 200 mL of pleural fluid. Fluid no growth    Acute on chronic respiratory failure with hypoxia due to COPD/CHF Suspected OSA --Patient oxygen level intermittently increased for comfort, he maintained appropriate oxygen saturations on 6 L.  Patient currently  refusing CPAP machine. --Incentive spirometry --Maintain O2 saturations 89-92%% pt currently on 6 liters and sats 96% --cont daily po prednisone  (chronic)   Asymptomatic  bacteruria  Neurogenic bladder- has a suprapubic catheter  --Ucx with proteus mirabilis, and MRSA. MRSA due to MRSA bacteremia likely in setting of line infection.   HTN --Blood pressure remains slightly elevated. --Continue Coreg  and Cardizem     Chronic pain On oxycodone    Uncontrolled type 2 diabetes mellitus with hyperglycemia, with long-term current use of insulin  (HCC) A1c 7.3 though ESRD.  --resume Lantus  and SSI      Hypothyroidism: On levothyroxine    Vitamin D  deficiency: Started Calcitrol 0.5 mcg p.o. daily. follow with PCP to repeat vitamin D  level in between 3 to 6 months.   Obesity class II Body mass index is 39.7 kg/m.  Interventions: Calorie restricted diet advised to lose body weight.      Wound 09/29/24 1832 Pressure Injury Buttocks Right;Left Stage 2 -  Partial thickness loss of dermis presenting as a shallow open injury with a red, pink wound bed without slough. (Activ      Pt has multiple co-morbidities and is at high risk for readmisison.   Pain control - [place]  Controlled Substance Reporting System database was reviewed. and patient was instructed, not to drive, operate heavy machinery, perform activities at heights, swimming or participation in water  activities or provide baby-sitting services while on Pain, Sleep and Anxiety Medications; until their outpatient Physician has advised to do so again. Also recommended to not to take more than prescribed Pain, Sleep and Anxiety Medications.  Consultants: ID, Nephrology Procedures performed: HD, thoracentesis  Disposition: Long term care facility Diet recommendation:  Renal diet DISCHARGE MEDICATION: Allergies as of 10/15/2024   No Known Allergies      Medication List     STOP taking these medications    sitaGLIPtin 25 MG tablet Commonly known as: JANUVIA       TAKE these medications    acetaminophen  325 MG tablet Commonly known as: TYLENOL  Take 650 mg by mouth in the morning and at  bedtime.   bisacodyl  10 MG suppository Commonly known as: DULCOLAX Place 10 mg rectally daily as needed for moderate constipation.   bisacodyl  5 MG EC tablet Commonly known as: DULCOLAX Take 2 tablets (10 mg total) by mouth at bedtime.   calcitRIOL  0.5 MCG capsule Commonly known as: ROCALTROL  Take 1 capsule (0.5 mcg total) by mouth daily.   carvedilol  6.25 MG tablet Commonly known as: COREG  Take 1 tablet (6.25 mg total) by mouth 2 (two) times daily with a meal. What changed: additional instructions   cetirizine 10 MG tablet Commonly known as: ZYRTEC Take 10 mg by mouth daily.   cyanocobalamin  1000 MCG tablet Take 1 tablet (1,000 mcg total) by mouth daily.   diltiazem  240 MG 24 hr capsule Commonly known as: CARDIZEM  CD Take 1 capsule (240 mg total) by mouth every evening. What changed: additional instructions   Doxepin  HCl 6 MG Tabs Take 6 mg by mouth at bedtime.   famotidine  10 MG tablet Commonly known as: PEPCID  Take 1 tablet (10 mg total) by mouth daily.   folic acid  1 MG tablet Commonly known as: FOLVITE  Take 1 tablet (1 mg total) by mouth daily.   furosemide  80 MG tablet Commonly known as: LASIX  Take 1 tablet (80 mg total) by mouth daily. What changed:  how much to take when to take this additional instructions   gabapentin  300 MG capsule Commonly known as:  NEURONTIN  Take 300 mg by mouth at bedtime.   guaifenesin  100 MG/5ML syrup Commonly known as: ROBITUSSIN Take 200 mg by mouth 3 (three) times daily as needed for cough.   insulin  glargine-yfgn 100 UNIT/ML injection Commonly known as: SEMGLEE  Inject 0.2 mLs (20 Units total) into the skin 2 (two) times daily.   insulin  lispro 100 UNIT/ML injection Commonly known as: HUMALOG  Inject 0-15 Units into the skin 4 (four) times daily -  before meals and at bedtime. <150= 0 units 151-200= 3 units 201-250= 6 units 251-300= 9 units 301-350= 12 units 351-400= 15 units >400 call MD    ipratropium-albuterol  0.5-2.5 (3) MG/3ML Soln Commonly known as: DUONEB Take 3 mLs by nebulization every 6 (six) hours as needed.   Lactulose  20 GM/30ML Soln Take 30 mLs (20 g total) by mouth 2 (two) times daily. What changed: how much to take   levothyroxine  175 MCG tablet Commonly known as: SYNTHROID  Take 175 mcg by mouth at bedtime.   linaclotide  145 MCG Caps capsule Commonly known as: LINZESS  Take 1 capsule (145 mcg total) by mouth daily before breakfast.   liver oil-zinc  oxide 40 % ointment Commonly known as: DESITIN Apply 1 Application topically as needed for irritation.   melatonin 5 MG Tabs Take 10 mg by mouth at bedtime.   metoCLOPramide  10 MG tablet Commonly known as: REGLAN  Take 0.5 tablets (5 mg total) by mouth every 8 (eight) hours as needed for nausea or vomiting.   midodrine  5 MG tablet Commonly known as: PROAMATINE  Take 20 mg by mouth. Monday, Wednesday and Friday   ondansetron  4 MG tablet Commonly known as: ZOFRAN  Take 4 mg by mouth every 8 (eight) hours as needed for nausea or vomiting.   oxyCODONE  15 MG immediate release tablet Commonly known as: ROXICODONE  Take 0.5 tablets (7.5 mg total) by mouth every 6 (six) hours as needed.   polyethylene glycol 17 g packet Commonly known as: MIRALAX  / GLYCOLAX  Take 17 g by mouth 2 (two) times daily.   predniSONE  10 MG tablet Commonly known as: DELTASONE  Take 10 mg by mouth daily with breakfast. What changed: Another medication with the same name was removed. Continue taking this medication, and follow the directions you see here.   senna-docusate 8.6-50 MG tablet Commonly known as: Senokot-S Take 1 tablet by mouth 2 (two) times daily.   simethicone  80 MG chewable tablet Commonly known as: MYLICON Chew 1 tablet (80 mg total) by mouth 4 (four) times daily.   vancomycin  IVPB Inject 1,000 mg into the vein every Monday, Wednesday, and Friday for 28 days. Vancomycin  1000mg  IV MWF with  hemodialysis Indication:  MRSA bacteremia Last Day of Therapy:  11/11/2024 Labs - Monday:  CBC/D, CMP, and pre-HD vancomycin  level Fax weekly lab results  promptly to [phone] Call [phone] with questions or critical value To be given at HD center   Vitamin D  (Ergocalciferol ) 1.25 MG (50000 UNIT) Caps capsule Commonly known as: DRISDOL  Take 1 capsule (50,000 Units total) by mouth every 7 (seven) days.               Home Infusion Instuctions  (From admission, onward)           Start     Ordered   10/14/24 0000  Home infusion instructions       Comments: Getting antibiotic at hemodialysis  Question Answer Comment  Instructions Other   Comments dialysis      10/14/24 0920  Follow-up Information     [facility] Follow up.   Specialty: Geriatric Medicine Why: hospital follow up Contact information: [address] [place] [address] [phone]         Custovic, Annalee, DO. Go in 1 month(s).   Specialty: Cardiology Contact information: [address] [place] [address] [phone]         Fayette Bodily, MD. Go on 11/03/2024.   Specialty: Infectious Diseases Why: Video call appt at 9:30 am Contact information: [address] [place] [address] [phone]                Discharge Exam: Fredricka Weights   10/12/24 1700 10/14/24 1207 10/14/24 1823  Weight: 125.5 kg 125.7 kg 123.7 kg   Morbidly obese, chronically ill appearing Resp--decrease BS bases CV s1s2 normal no murmur Abd--suprapubic cath + Wound 09/29/24 1832 Pressure Injury Buttocks Right;Left Stage 2 -  Partial thickness loss of dermis presenting as a shallow open injury with a red, pink wound bed without slough. (Active)      Condition at discharge: fair  The results of significant diagnostics from this hospitalization (including imaging, microbiology, ancillary and laboratory) are listed below  for reference.   Imaging Studies: DG Abd 1 View Result Date: 10/14/2024 EXAM: 1 VIEW XRAY OF THE ABDOMEN 10/14/2024 07:13:00 AM COMPARISON: Supine abdomen study yesterday at 9 o'clock am. CLINICAL HISTORY: [address] Ileus [facility]) [id number] Ileus (HCC) [id number] Ileus (HCC) [id number] Ileus (HCC) [phone] FINDINGS: BOWEL: Nonobstructive bowel gas pattern with moderate fecal stasis. There is no supine evidence of free air. SOFT TISSUES: Small pleural effusions and overlying lung base opacities are present. BONES: Fusion hardware in the thoracic spine is again noted. No acute fracture. IMPRESSION: 1. Nonobstructive bowel gas pattern without supine evidence of pneumoperitoneum; moderate fecal stasis. Unchanged. 2. Small pleural effusions with overlying lung base opacities. Electronically signed by: Francis Quam MD 10/14/2024 07:34 AM EST RP Workstation: [id number]   DG Chest Port 1 View Result Date: 10/13/2024 CLINICAL DATA:  Shortness of breath EXAM: PORTABLE CHEST 1 VIEW COMPARISON:  10/13/2024, 10/11/2024, chest CT 10/06/2024 FINDINGS: Left-sided central venous catheter tip projecting over the brachiocephalic vein/SVC confluence. Extensive spinal hardware as before. Cardiomegaly with pleural effusions and left greater than right basilar airspace disease, not significantly changed. Background vascular congestion and pulmonary edema. No pneumothorax IMPRESSION: 1. Cardiomegaly with vascular congestion and pulmonary edema. 2. Small bilateral pleural effusions with left greater than right basilar airspace disease, not significantly changed. No pneumothorax is seen Electronically Signed   By: Luke Bun M.D.   On: 10/13/2024 19:36   US  THORACENTESIS LEFT ASP PLEURAL SPACE W/IMG GUIDE Result Date: 10/13/2024 INDICATION: Patient with a history of end-stage renal disease on hemodialysis and heart failure presents today with left pleural effusion. Interventional Radiology asked to perform a diagnostic and therapeutic  thoracentesis. EXAM: ULTRASOUND GUIDED THORACENTESIS MEDICATIONS: 1% lidocaine  10 COMPLICATIONS: None immediate. PROCEDURE: An ultrasound guided thoracentesis was thoroughly discussed with the patient and questions answered. The benefits, risks, alternatives and complications were also discussed. The patient understands and wishes to proceed with the procedure. Written consent was obtained. Ultrasound was performed to localize and mark an adequate pocket of fluid in the left chest. The area was then prepped and draped in the normal sterile fashion. 1% Lidocaine  was used for local anesthesia. Under ultrasound guidance a 6 Fr Safe-T-Centesis catheter was introduced. Thoracentesis was performed. The catheter was removed and a dressing applied. FINDINGS: A total  of approximately 200 mL of clear yellow fluid was removed. Samples were sent to the laboratory as requested by the clinical team. IMPRESSION: Successful ultrasound guided left thoracentesis yielding 200 mL of pleural fluid. Procedure performed by: Warren Dais, NP Electronically Signed   By: Juliene Balder M.D.   On: 10/13/2024 15:51   DG Chest Port 1 View Result Date: 10/13/2024 CLINICAL DATA:  Status post thoracentesis. EXAM: PORTABLE CHEST 1 VIEW COMPARISON:  Chest radiograph dated 10/11/2024. FINDINGS: Left IJ catheter in similar position. Cardiomegaly with vascular congestion and edema relatively similar to prior radiograph. Small bilateral pleural effusions similar to prior radiograph. No pneumothorax. No acute osseous pathology. Spinal hardware. IMPRESSION: No interval change.  No pneumothorax. Electronically Signed   By: Vanetta Chou M.D.   On: 10/13/2024 15:11   DG Abd 1 View Result Date: 10/13/2024 CLINICAL DATA:  Ileus. EXAM: ABDOMEN - 1 VIEW COMPARISON:  10/12/2024 FINDINGS: Bowel gas pattern is nonobstructive. No free peritoneal air. There is moderate fecal retention over the colon most prominent over the left colon and rectosigmoid colon.  Remainder of the exam is unchanged. IMPRESSION: Nonobstructive bowel gas pattern with moderate fecal retention over the colon. Electronically Signed   By: Toribio Agreste M.D.   On: 10/13/2024 09:16   DG Abd 1 View Result Date: 10/12/2024 EXAM: 1 VIEW XRAY OF THE ABDOMEN 10/12/2024 11:01:00 AM COMPARISON: 10/11/2024 CLINICAL HISTORY: [address] Ileus [facility]) [id number] Ileus (HCC) [id number] [phone] Ileus (HCC) [id number] Ileus (HCC) [address]1250 [id number] Ileus (HCC) [phone] FINDINGS: BOWEL: Interval decreased gaseous distention of the bowel. Moderate colonic stool burden. The bowel gas pattern is nonobstructive. SOFT TISSUES: Small right pleural effusion. Probable left effusion and adjacent parenchymal opacity. No abnormal calcifications. BONES: Thoracic spinal fixation hardware in place. No acute fracture. IMPRESSION: 1. Decreased gaseous distention of the bowel. 2. Moderate colonic stool burden. 3. Small right pleural effusion with probable left pleural effusion and adjacent parenchymal opacity. Electronically signed by: Dayne Hassell MD 10/12/2024 12:21 PM EST RP Workstation: [id number]   US  CHEST (PLEURAL EFFUSION) Result Date: 10/12/2024 EXAM: US  Chest Limited, Evaluate for Pleural Effusion. 10/12/2024 10:57:09 AM TECHNIQUE: Real-time ultrasound of the chest to evaluate for pleural effusion. COMPARISON: CT 10/06/2024. CLINICAL HISTORY: Pleural effusion on right. FINDINGS: LEFT PLEURAL SPACE: Small left pleural effusion. RIGHT PLEURAL SPACE: Small right pleural effusion with some adjacent atelectatic/consolidated pulmonary parenchyma. IMPRESSION: 1. Small right pleural effusion with adjacent atelectatic/consolidated pulmonary parenchyma. 2. Small left pleural effusion. Electronically signed by: Katheleen Faes MD 10/12/2024 12:19 PM EST RP Workstation: [id number]   DG Abd 1 View Result Date: 10/11/2024 EXAM: 1 VIEW XRAY OF THE ABDOMEN 10/11/2024 11:15:00 AM COMPARISON: 10/09/2024 CLINICAL HISTORY: 01250 Ileus (HCC) [id number] Ileus  (HCC) [id number] Ileus (HCC) FINDINGS: BOWEL: Moderate distention of the stomach and gas-distended loops of bowel throughout the abdomen. Moderate stool burden. SOFT TISSUES: No abnormal calcifications. BONES: Thoracic fusion hardware in place. No acute fracture. IMPRESSION: 1. Gas distended loops of bowel throughout the abdomen may represent ileus. Moderate colonic stool burden. Electronically signed by: Michaeline Blanch MD 10/11/2024 11:24 AM EST RP Workstation: [id number]   DG Chest Port 1 View Result Date: 10/11/2024 EXAM: 1 VIEW XRAY OF THE CHEST 10/11/2024 11:15:00 AM COMPARISON: 10/03/2024 CLINICAL HISTORY: Ileus (HCC) FINDINGS: LINES, TUBES AND DEVICES: Left IJ CVC in place with tip at the brachiocephalic vein/SVC confluence. LUNGS AND PLEURA: Low lung volumes. Diffuse interstitial opacities, likely pulmonary edema. Bibasilar pulmonary opacities. Moderate left and small right pleural effusions. No pneumothorax. HEART AND MEDIASTINUM:  Similar cardiomegaly. No acute abnormality of the mediastinal silhouette. BONES AND SOFT TISSUES: Partially imaged thoracolumbar fusion hardware. No acute osseous abnormality. IMPRESSION: 1. Left IJ central venous catheter with tip at the confluence of the brachiocephalic veins. No pneumothorax. 2. Likely pulmonary edema. 3. Moderate left and small right pleural effusions with bibasilar opacity . Electronically signed by: Michaeline Blanch MD 10/11/2024 11:22 AM EST RP Workstation: [id number]   DG Abd 1 View Result Date: 10/09/2024 CLINICAL DATA:  Ileus EXAM: ABDOMEN - 1 VIEW COMPARISON:  Two days ago FINDINGS: No abnormal bowel dilatation is noted. Moderate amount of stool is seen in the right colon. No radio-opaque calculi or other significant radiographic abnormality are seen. IMPRESSION: Moderate stool burden. No abnormal bowel dilatation. Electronically Signed   By: Lynwood Landy Raddle M.D.   On: 10/09/2024 11:56   DG Abd 1 View Result Date: 10/07/2024 EXAM: 1 VIEW XRAY OF THE ABDOMEN  10/07/2024 07:17:00 PM COMPARISON: XR Abdomen 09/29/2024, CT Abdomen/Pelvis 09/30/2024. CLINICAL HISTORY: SBO (small bowel obstruction) (HCC). FINDINGS: BOWEL: Diffusely gas-filled, although not overtly distended, small bowel and colon throughout abdomen and pelvis. nonobstructive bowel gas pattern. SOFT TISSUES: No abnormal calcifications. BONES: Postoperative changes in lower thoracic spine. No acute fracture. LUNGS: Bilateral lung base opacity. IMPRESSION: 1. Nonobstructive bowel gas pattern. 2. Bilateral lung base opacity. Electronically signed by: Morgane Naveau MD 10/07/2024 07:24 PM EST RP Workstation: [id number]   CT Angio Chest Pulmonary Embolism (PE) W or WO Contrast Result Date: 10/06/2024 CLINICAL DATA:  Possible sepsis.  Evaluate for pulmonary embolism. EXAM: CT ANGIOGRAPHY CHEST WITH CONTRAST TECHNIQUE: Multidetector CT imaging of the chest was performed using the standard protocol during bolus administration of intravenous contrast. Multiplanar CT image reconstructions and MIPs were obtained to evaluate the vascular anatomy. RADIATION DOSE REDUCTION: This exam was performed according to the departmental dose-optimization program which includes automated exposure control, adjustment of the mA and/or kV according to patient size and/or use of iterative reconstruction technique. CONTRAST:  OMNIPAQUE  IOHEXOL  350 MG/ML SOLN COMPARISON:  CT 06/23/2024, 05/11/2024 FINDINGS: Cardiovascular: Mild stable cardiomegaly. Mild calcified plaque over the left main and 3 vessel coronary arteries. Thoracic aorta is normal in caliber. Minimal calcified plaque over the thoracic aorta. Pulmonary arterial system is adequately opacified without definite pulmonary emboli. Remaining vascular structures are unremarkable. Mediastinum/Nodes: 1 cm right paratracheal lymph node likely reactive. No other concerning mediastinal or hilar adenopathy. Mediastinal structures are otherwise unremarkable. Lungs/Pleura: Small to  moderate bilateral pleural effusions left greater than right with associated compressive atelectasis within the lung bases as these findings are slightly worse to unchanged. There is patchy hazy airspace attenuation over the mid to upper lungs with areas of mild nodularity new since the prior exam and may represent an acute infectious or inflammatory process. Obstruction of a left posterior basilar bronchus. Mild debris along the right lateral wall of the trachea. Upper Abdomen: No acute findings of the visualized upper abdomen. Musculoskeletal: Stable bilateral changes suggesting gynecomastia. Spinal stabilization hardware over the thoracic spine bridging corpectomy of 2 vertebral body levels over the lower thoracic spine. Review of the MIP images confirms the above findings. IMPRESSION: 1. No evidence of pulmonary embolism. 2. Small to moderate bilateral pleural effusions left greater than right with associated compressive atelectasis within the lung bases as these findings are slightly worse to unchanged. 3. Patchy hazy airspace attenuation over the mid to upper lungs with areas of mild nodularity new since the prior exam and may represent an acute infectious or inflammatory process.  4. Aortic atherosclerosis. Atherosclerotic coronary artery disease. Aortic Atherosclerosis (ICD10-I70.0). Electronically Signed   By: Toribio Agreste M.D.   On: 10/06/2024 15:13   PERIPHERAL VASCULAR CATHETERIZATION Result Date: 10/05/2024 See surgical note for result.  DG Chest Port 1 View Result Date: 10/03/2024 EXAM: 1 VIEW(S) XRAY OF THE CHEST 10/03/2024 04:21:00 PM COMPARISON: 09/29/2024 CLINICAL HISTORY: Oxygen desaturation FINDINGS: LINES, TUBES AND DEVICES: Right internal jugular hemodialysis catheter removed. LUNGS AND PLEURA: Prominent vasculature slightly increased interstitial markings . Stable left lung base consolidation and small loculated left pleural effusion. Stable right basilar atelectasis. Coarsening  interstitial markings. Right costophrenic angle collimated off-view No pneumothorax. HEART AND MEDIASTINUM: Stable cardiomegaly. Unchanged cardiomediastinal silhouette. BONES AND SOFT TISSUES: Thoracolumbar fusion hardware noted. No acute osseous abnormality. IMPRESSION: 1. Mild pulmonary edema. 2. Stable left lung base consolidation and small loculated left pleural effusion. Electronically signed by: Morgane Naveau MD MD 10/03/2024 07:17 PM EST RP Workstation: [id number]   ECHOCARDIOGRAM COMPLETE Result Date: 10/02/2024    ECHOCARDIOGRAM REPORT   Patient Name:   RITIK STAVOLA Date of Exam: 10/02/2024 Medical Rec #:  [id number]      Height:       70.0 in Accession #:    [id number]     Weight:       272.5 lb Date of Birth:  Oct 04, 1960      BSA:          2.381 m Patient Age:    63 years       BP:           147/68 mmHg Patient Gender: M              HR:           79 bpm. Exam Location:  [facility] Procedure: 2D Echo, Cardiac Doppler and Color Doppler (Both Spectral and Color            Flow Doppler were utilized during procedure). Indications:     Bacteremia R78.81  History:         Patient has prior history of Echocardiogram examinations, most                  recent 04/19/2024. Risk Factors:Dyslipidemia and Diabetes. End                  stage renal disease.  Sonographer:     Christopher Furnace Referring Phys:  [id number] DONALD BERLIN Diagnosing Phys: Caron Poser  Sonographer Comments: Technically challenging study due to limited acoustic windows, no apical window and no subcostal window. Image acquisition challenging due to patient body habitus. IMPRESSIONS  1. Very technically difficult study.  2. The mitral valve was not well visualized. No evidence of mitral valve regurgitation. No evidence of mitral stenosis.  3. The aortic valve has an indeterminant number of cusps. Aortic valve regurgitation is not visualized. No aortic stenosis is present.  4. Left ventricular ejection fraction, by estimation, is 55 to 60% based on  parasternal windows only. The left ventricle probably has normal function, though very poorly visualized. Left ventricular endocardial border not optimally defined to evaluate regional wall motion. Left ventricular diastolic parameters are indeterminate.  5. Right ventricular systolic function was not well visualized. The right ventricular size is not well visualized. Comparison(s): A prior study was performed on 04/19/2024. No significant change, though unable to accurately compare due to poor image quality. FINDINGS  Left Ventricle: Left ventricular ejection fraction, by estimation, is 55 to 60%. The left ventricle has normal function.  Left ventricular endocardial border not optimally defined to evaluate regional wall motion. The left ventricular internal cavity size was normal in size. Suboptimal image quality limits for assessment of left ventricular hypertrophy. Left ventricular diastolic parameters are indeterminate. Right Ventricle: The right ventricular size is not well visualized. Right vetricular wall thickness was not well visualized. Right ventricular systolic function was not well visualized. Left Atrium: Left atrial size was not well visualized. Right Atrium: Right atrial size was not well visualized. Pericardium: There is no evidence of pericardial effusion. Mitral Valve: The mitral valve was not well visualized. No evidence of mitral valve regurgitation. No evidence of mitral valve stenosis. Tricuspid Valve: The tricuspid valve is not well visualized. Tricuspid valve regurgitation is not demonstrated. No evidence of tricuspid stenosis. Aortic Valve: The aortic valve has an indeterminant number of cusps. Aortic valve regurgitation is not visualized. No aortic stenosis is present. Pulmonic Valve: The pulmonic valve was not well visualized. Pulmonic valve regurgitation is not visualized. No evidence of pulmonic stenosis. Aorta: The aortic root was not well visualized. IAS/Shunts: The interatrial septum  was not well visualized.  LEFT VENTRICLE PLAX 2D LVIDd:         3.07 cm LVIDs:         1.93 cm LV PW:         2.62 cm LV IVS:        1.54 cm LVOT diam:     2.10 cm LVOT Area:     3.46 cm  LEFT ATRIUM         Index LA diam:    3.30 cm 1.39 cm/m   AORTA Ao Root diam: 3.70 cm  SHUNTS Systemic Diam: 2.10 cm Caron Poser Electronically signed by Caron Poser Signature Date/Time: 10/02/2024/2:07:14 PM    Final    PERIPHERAL VASCULAR CATHETERIZATION Result Date: 10/01/2024 See surgical note for result.  CT ABDOMEN PELVIS WO CONTRAST Result Date: 09/30/2024 EXAM: CT ABDOMEN AND PELVIS WITHOUT CONTRAST 09/30/2024 02:32:43 AM TECHNIQUE: CT of the abdomen and pelvis was performed without the administration of intravenous contrast. Multiplanar reformatted images are provided for review. Automated exposure control, iterative reconstruction, and/or weight-based adjustment of the mA/kV was utilized to reduce the radiation dose to as low as reasonably achievable. COMPARISON: 05/11/2024 CLINICAL HISTORY: Abdominal pain, acute, nonlocalized. FINDINGS: LOWER CHEST: Small bilateral pleural effusions. Bilateral rounded lower lobe airspace opacities could reflect rounded atelectasis or pneumonia. LIVER: The liver is unremarkable. GALLBLADDER AND BILE DUCTS: Gallbladder is unremarkable. No biliary ductal dilatation. SPLEEN: No acute abnormality. PANCREAS: No acute abnormality. ADRENAL GLANDS: No acute abnormality. KIDNEYS, URETERS AND BLADDER: No stones in the kidneys or ureters. No hydronephrosis. No perinephric or periureteral stranding. Suprapubic Foley catheter in place with urinary bladder decompressed. GI AND BOWEL: Stomach demonstrates no acute abnormality. Rectosigmoid wall thickening concerning for proctocolitis. No bowel obstruction. Normal appendix. PERITONEUM AND RETROPERITONEUM: No ascites. No free air. VASCULATURE: Aorta is normal in caliber. Aortic atherosclerosis. LYMPH NODES: No lymphadenopathy. REPRODUCTIVE  ORGANS: No acute abnormality. BONES AND SOFT TISSUES: Postoperative changes in the thoracic spine. Stable mild chronic compression fracture at T12 and L1. Umbilical hernia containing fat, stable. No focal soft tissue abnormality. IMPRESSION: 1. Rectosigmoid wall thickening concerning for proctocolitis. No bowel obstruction. Normal appendix. 2. Small bilateral pleural effusions. 3. Bilateral rounded lower lobe airspace opacities, possibly representing rounded atelectasis or pneumonia. Electronically signed by: Franky Crease MD 09/30/2024 02:45 AM EST RP Workstation: [id number]   DG Abd 2 Views Result Date: 09/29/2024 CLINICAL DATA:  Abdominal pain  EXAM: DG ABDOMEN 2V COMPARISON:  05/12/2024 FINDINGS: Extensive hardware in the thoracic spine. Left greater than right pleural effusions and basilar airspace disease. Multiple moderate air-filled dilated loops of small bowel measuring up to 4.2 cm. IMPRESSION: 1. Moderate diffuse air distension of the bowel, potentially due to ileus with partial or developing bowel obstruction not excluded. Consider radiographic follow-up. 2. Left greater than right pleural effusions and basilar airspace disease. Electronically Signed   By: Luke Bun M.D.   On: 09/29/2024 17:00   DG Chest Port 1 View Result Date: 09/29/2024 CLINICAL DATA:  Desaturation EXAM: PORTABLE CHEST 1 VIEW COMPARISON:  08/03/2024 FINDINGS: Right-sided central venous catheter tip at the cavoatrial region. Posterior spinal fusion hardware and interbody cage device. Cardiomegaly with vascular congestion and suspected mild interstitial edema. Left greater than right pleural effusions. IMPRESSION: Cardiomegaly with vascular congestion and suspected mild interstitial edema. Left greater than right pleural effusions. Electronically Signed   By: Luke Bun M.D.   On: 09/29/2024 15:21    Microbiology: Results for orders placed or performed during the hospital encounter of 09/29/24  Resp panel by RT-PCR (RSV,  Flu A&B, Covid) Anterior Nasal Swab     Status: None   Collection Time: 09/29/24  2:16 PM   Specimen: Anterior Nasal Swab  Result Value Ref Range Status   SARS Coronavirus 2 by RT PCR NEGATIVE NEGATIVE Final    Comment: (NOTE) SARS-CoV-2 target nucleic acids are NOT DETECTED.  The SARS-CoV-2 RNA is generally detectable in upper respiratory specimens during the acute phase of infection. The lowest concentration of SARS-CoV-2 viral copies this assay can detect is 138 copies/mL. A negative result does not preclude SARS-Cov-2 infection and should not be used as the sole basis for treatment or other patient management decisions. A negative result may occur with  improper specimen collection/handling, submission of specimen other than nasopharyngeal swab, presence of viral mutation(s) within the areas targeted by this assay, and inadequate number of viral copies(<138 copies/mL). A negative result must be combined with clinical observations, patient history, and epidemiological information. The expected result is Negative.  Fact Sheet for Patients:  bloggercourse.com  Fact Sheet for Healthcare Providers:  seriousbroker.it  This test is no t yet approved or cleared by the [place]  FDA and  has been authorized for detection and/or diagnosis of SARS-CoV-2 by FDA under an Emergency Use Authorization (EUA). This EUA will remain  in effect (meaning this test can be used) for the duration of the COVID-19 declaration under Section 564(b)(1) of the Act, 21 U.S.C.section 360bbb-3(b)(1), unless the authorization is terminated  or revoked sooner.       Influenza A by PCR NEGATIVE NEGATIVE Final   Influenza B by PCR NEGATIVE NEGATIVE Final    Comment: (NOTE) The Xpert Xpress SARS-CoV-2/FLU/RSV plus assay is intended as an aid in the diagnosis of influenza from Nasopharyngeal swab specimens and should not be used as a sole basis for  treatment. Nasal washings and aspirates are unacceptable for Xpert Xpress SARS-CoV-2/FLU/RSV testing.  Fact Sheet for Patients: bloggercourse.com  Fact Sheet for Healthcare Providers: seriousbroker.it  This test is not yet approved or cleared by the [place]  FDA and has been authorized for detection and/or diagnosis of SARS-CoV-2 by FDA under an Emergency Use Authorization (EUA). This EUA will remain in effect (meaning this test can be used) for the duration of the COVID-19 declaration under Section 564(b)(1) of the Act, 21 U.S.C. section 360bbb-3(b)(1), unless the authorization is terminated or revoked.  Resp Syncytial Virus by PCR NEGATIVE NEGATIVE Final    Comment: (NOTE) Fact Sheet for Patients: bloggercourse.com  Fact Sheet for Healthcare Providers: seriousbroker.it  This test is not yet approved or cleared by the [place]  FDA and has been authorized for detection and/or diagnosis of SARS-CoV-2 by FDA under an Emergency Use Authorization (EUA). This EUA will remain in effect (meaning this test can be used) for the duration of the COVID-19 declaration under Section 564(b)(1) of the Act, 21 U.S.C. section 360bbb-3(b)(1), unless the authorization is terminated or revoked.  Performed at [facility], [address] [place], [place] [address]   Urine Culture     Status: Abnormal   Collection Time: 09/29/24  2:17 PM   Specimen: Urine, Random  Result Value Ref Range Status   Specimen Description   Final    URINE, RANDOM Performed at [facility], [address] [place], [place] [address]    Special Requests   Final    NONE Reflexed from [phone] Performed at [facility], [address] [place], [place] [address]    Culture (A)  Final    >=100,000 COLONIES/mL PROTEUS MIRABILIS >=100,000 COLONIES/mL METHICILLIN RESISTANT  STAPHYLOCOCCUS AUREUS    Report Status 10/02/2024 FINAL  Final   Organism ID, Bacteria PROTEUS MIRABILIS (A)  Final   Organism ID, Bacteria METHICILLIN RESISTANT STAPHYLOCOCCUS AUREUS (A)  Final      Susceptibility   Methicillin resistant staphylococcus aureus - MIC*    CIPROFLOXACIN  >=8 RESISTANT Resistant     GENTAMICIN <=0.5 SENSITIVE Sensitive     NITROFURANTOIN <=16 SENSITIVE Sensitive     OXACILLIN >=4 RESISTANT Resistant     TETRACYCLINE <=1 SENSITIVE Sensitive     VANCOMYCIN  1 SENSITIVE Sensitive     TRIMETH /SULFA  <=10 SENSITIVE Sensitive     RIFAMPIN  <=0.5 SENSITIVE Sensitive     Inducible Clindamycin NEGATIVE Sensitive     LINEZOLID  2 SENSITIVE Sensitive     * >=100,000 COLONIES/mL METHICILLIN RESISTANT STAPHYLOCOCCUS AUREUS   Proteus mirabilis - MIC*    AMPICILLIN <=2 SENSITIVE Sensitive     CEFAZOLIN  (URINE) Value in next row Sensitive      4 SENSITIVEThis is a modified FDA-approved test that has been validated and its performance characteristics determined by the reporting laboratory.  This laboratory is certified under the Clinical Laboratory Improvement Amendments CLIA as qualified to perform high complexity clinical laboratory testing.    CEFEPIME  Value in next row Sensitive      4 SENSITIVEThis is a modified FDA-approved test that has been validated and its performance characteristics determined by the reporting laboratory.  This laboratory is certified under the Clinical Laboratory Improvement Amendments CLIA as qualified to perform high complexity clinical laboratory testing.    ERTAPENEM Value in next row Sensitive      4 SENSITIVEThis is a modified FDA-approved test that has been validated and its performance characteristics determined by the reporting laboratory.  This laboratory is certified under the Clinical Laboratory Improvement Amendments CLIA as qualified to perform high complexity clinical laboratory testing.    CEFTRIAXONE  Value in next row Sensitive      4  SENSITIVEThis is a modified FDA-approved test that has been validated and its performance characteristics determined by the reporting laboratory.  This laboratory is certified under the Clinical Laboratory Improvement Amendments CLIA as qualified to perform high complexity clinical laboratory testing.    CIPROFLOXACIN  Value in next row Resistant      4 SENSITIVEThis is a modified FDA-approved test that  has been validated and its performance characteristics determined by the reporting laboratory.  This laboratory is certified under the Clinical Laboratory Improvement Amendments CLIA as qualified to perform high complexity clinical laboratory testing.    GENTAMICIN Value in next row Sensitive      4 SENSITIVEThis is a modified FDA-approved test that has been validated and its performance characteristics determined by the reporting laboratory.  This laboratory is certified under the Clinical Laboratory Improvement Amendments CLIA as qualified to perform high complexity clinical laboratory testing.    NITROFURANTOIN Value in next row Resistant      4 SENSITIVEThis is a modified FDA-approved test that has been validated and its performance characteristics determined by the reporting laboratory.  This laboratory is certified under the Clinical Laboratory Improvement Amendments CLIA as qualified to perform high complexity clinical laboratory testing.    TRIMETH /SULFA  Value in next row Sensitive      4 SENSITIVEThis is a modified FDA-approved test that has been validated and its performance characteristics determined by the reporting laboratory.  This laboratory is certified under the Clinical Laboratory Improvement Amendments CLIA as qualified to perform high complexity clinical laboratory testing.    AMPICILLIN/SULBACTAM Value in next row Sensitive      4 SENSITIVEThis is a modified FDA-approved test that has been validated and its performance characteristics determined by the reporting laboratory.  This  laboratory is certified under the Clinical Laboratory Improvement Amendments CLIA as qualified to perform high complexity clinical laboratory testing.    PIP/TAZO Value in next row Sensitive      <=4 SENSITIVEThis is a modified FDA-approved test that has been validated and its performance characteristics determined by the reporting laboratory.  This laboratory is certified under the Clinical Laboratory Improvement Amendments CLIA as qualified to perform high complexity clinical laboratory testing.    MEROPENEM Value in next row Sensitive      <=4 SENSITIVEThis is a modified FDA-approved test that has been validated and its performance characteristics determined by the reporting laboratory.  This laboratory is certified under the Clinical Laboratory Improvement Amendments CLIA as qualified to perform high complexity clinical laboratory testing.    * >=100,000 COLONIES/mL PROTEUS MIRABILIS  Blood Culture (routine x 2)     Status: Abnormal   Collection Time: 09/29/24  2:37 PM   Specimen: BLOOD  Result Value Ref Range Status   Specimen Description   Final    BLOOD BLOOD RIGHT FOREARM Performed at [facility], [address] [place], [place] [address]    Special Requests   Final    BOTTLES DRAWN AEROBIC AND ANAEROBIC Blood Culture results may not be optimal due to an inadequate volume of blood received in culture bottles Performed at [facility], [address], [place], [place] [address]    Culture  Setup Time   Final    GRAM POSITIVE COCCI IN BOTH AEROBIC AND ANAEROBIC BOTTLES CRITICAL VALUE NOTED.  VALUE IS CONSISTENT WITH PREVIOUSLY REPORTED AND CALLED VALUE.    Culture (A)  Final    STAPHYLOCOCCUS AUREUS SUSCEPTIBILITIES PERFORMED ON PREVIOUS CULTURE WITHIN THE LAST 5 DAYS. Performed at [facility], [address] [place], [place] [address]    Report Status 10/02/2024 FINAL  Final  Blood Culture (routine x 2)     Status: Abnormal   Collection Time:  09/29/24  2:37 PM   Specimen: BLOOD  Result Value Ref Range Status   Specimen Description   Final    BLOOD LEFT ANTECUBITAL Performed at [facility], [address], [place], [place] [address]    Special Requests   Final    BOTTLES DRAWN AEROBIC AND ANAEROBIC Blood Culture results may not be optimal due to an inadequate volume of blood received in culture bottles Performed at [facility], [address] [place], [place] [address]    Culture  Setup Time   Final    GRAM POSITIVE COCCI IN BOTH AEROBIC AND ANAEROBIC BOTTLES CRITICAL RESULT CALLED TO, READ BACK BY AND VERIFIED WITH: PHARMD NATHAN B ON [id number] @[id number]  BY HNM    Culture (A)  Final    METHICILLIN RESISTANT STAPHYLOCOCCUS AUREUS SEE SEPARATE REPORT Performed at [facility], [address], [place], [place] [address]    Report Status 10/12/2024 FINAL  Final   Organism ID, Bacteria METHICILLIN RESISTANT STAPHYLOCOCCUS AUREUS  Final      Susceptibility   Methicillin resistant staphylococcus aureus - MIC*    CIPROFLOXACIN  >=8 RESISTANT Resistant     ERYTHROMYCIN  >=8 RESISTANT Resistant     GENTAMICIN <=0.5 SENSITIVE Sensitive     OXACILLIN >=4 RESISTANT Resistant     TETRACYCLINE <=1 SENSITIVE Sensitive     VANCOMYCIN  1 SENSITIVE Sensitive     TRIMETH /SULFA  <=10 SENSITIVE Sensitive     CLINDAMYCIN >=8 RESISTANT Resistant     RIFAMPIN  <=0.5 SENSITIVE Sensitive     Inducible Clindamycin NEGATIVE Sensitive     LINEZOLID  2 SENSITIVE Sensitive     * METHICILLIN RESISTANT STAPHYLOCOCCUS AUREUS  Blood Culture ID Panel (Reflexed)     Status: Abnormal   Collection Time: 09/29/24  2:37 PM  Result Value Ref Range Status   Enterococcus faecalis NOT DETECTED NOT DETECTED Final   Enterococcus Faecium NOT DETECTED NOT DETECTED Final   Listeria monocytogenes NOT DETECTED NOT DETECTED Final   Staphylococcus species DETECTED (A) NOT DETECTED Final    Comment: CRITICAL RESULT CALLED TO, READ BACK BY AND  VERIFIED WITHBETHA RANKIN DILLS PHARMD [id number] 09/30/24 HNM    Staphylococcus aureus (BCID) DETECTED (A) NOT DETECTED Final    Comment: Methicillin (oxacillin)-resistant Staphylococcus aureus (MRSA). MRSA is predictably resistant to beta-lactam antibiotics (except ceftaroline). Preferred therapy is vancomycin  unless clinically contraindicated. Patient requires contact precautions if  hospitalized. CRITICAL RESULT CALLED TO, READ BACK BY AND VERIFIED WITH: RANKIN DILLS PHARMD [id number] 09/30/24 HNM    Staphylococcus epidermidis NOT DETECTED NOT DETECTED Final   Staphylococcus lugdunensis NOT DETECTED NOT DETECTED Final   Streptococcus species NOT DETECTED NOT DETECTED Final   Streptococcus agalactiae NOT DETECTED NOT DETECTED Final   Streptococcus pneumoniae NOT DETECTED NOT DETECTED Final   Streptococcus pyogenes NOT DETECTED NOT DETECTED Final   A.calcoaceticus-baumannii NOT DETECTED NOT DETECTED Final   Bacteroides fragilis NOT DETECTED NOT DETECTED Final   Enterobacterales NOT DETECTED NOT DETECTED Final   Enterobacter cloacae complex NOT DETECTED NOT DETECTED Final   Escherichia coli NOT DETECTED NOT DETECTED Final   Klebsiella aerogenes NOT DETECTED NOT DETECTED Final   Klebsiella oxytoca NOT DETECTED NOT DETECTED Final   Klebsiella pneumoniae NOT DETECTED NOT DETECTED Final   Proteus species NOT DETECTED NOT DETECTED Final   Salmonella species NOT DETECTED NOT DETECTED Final   Serratia marcescens NOT DETECTED NOT DETECTED Final   Haemophilus influenzae NOT DETECTED NOT DETECTED Final   Neisseria meningitidis NOT DETECTED NOT DETECTED Final   Pseudomonas aeruginosa NOT DETECTED NOT DETECTED Final   Stenotrophomonas maltophilia NOT DETECTED NOT DETECTED Final   Candida albicans NOT DETECTED NOT DETECTED Final   Candida auris NOT  DETECTED NOT DETECTED Final   Candida glabrata NOT DETECTED NOT DETECTED Final   Candida krusei NOT DETECTED NOT DETECTED Final   Candida parapsilosis NOT DETECTED NOT  DETECTED Final   Candida tropicalis NOT DETECTED NOT DETECTED Final   Cryptococcus neoformans/gattii NOT DETECTED NOT DETECTED Final   Meth resistant mecA/C and MREJ DETECTED (A) NOT DETECTED Final    Comment: CRITICAL RESULT CALLED TO, READ BACK BY AND VERIFIED WITHBETHA RANKIN DILLS [facility] 9542 09/30/24 HNM Performed at [facility], [address] [place], [place] [address]   MIC (1 Drug)-     Status: Abnormal   Collection Time: 09/29/24  2:37 PM  Result Value Ref Range Status   Min Inhibitory Conc (1 Drug) Final report (A)  Corrected    Comment: (NOTE) Performed At: [facility] [address] [place], [place] [id number] Jennette Shorter MD Ey:[id number] CORRECTED ON 01/16 AT 0636: PREVIOUSLY REPORTED AS Preliminary report    Source BLOOD  Final    Comment: Performed at [facility], [address] [place], [place] [address]  MIC Result     Status: Abnormal   Collection Time: 09/29/24  2:37 PM  Result Value Ref Range Status   Result 1 (MIC) Comment (A)  Final    Comment: (NOTE) Methicillin - resistant Staphylococcus aureus Identification performed by account, not confirmed by this laboratory. DAPTOMYCIN     <=1 UG/ML = SUSCEPTIBLE Performed At: [facility] [address] [place], [place] [id number] Jennette Shorter MD Ey:[id number]   MRSA Next Gen by PCR, Nasal     Status: Abnormal   Collection Time: 09/30/24  5:00 PM   Specimen: Nasal Mucosa; Nasal Swab  Result Value Ref Range Status   MRSA by PCR Next Gen DETECTED (A) NOT DETECTED Final    Comment: RESULT CALLED TO, READ BACK BY AND VERIFIED WITH: TAMMY ORLANDO 2032 09/30/24 MU (NOTE) The GeneXpert MRSA Assay (FDA approved for NASAL specimens only), is one component of a comprehensive MRSA colonization surveillance program. It is not intended to diagnose MRSA infection nor to guide or monitor treatment for MRSA infections. Test performance is not FDA approved in patients less than 13  years old. Performed at [facility], [address] [place], [place] [address]   Cath Tip Culture     Status: Abnormal   Collection Time: 10/01/24 11:03 AM   Specimen: Catheter Tip; Other  Result Value Ref Range Status   Specimen Description   Final    CATH TIP Performed at [facility], [address] [place], [place] [address]    Special Requests   Final    NONE Performed at [facility], [address] [place], [place] [address]    Culture METHICILLIN RESISTANT STAPHYLOCOCCUS AUREUS (A)  Final   Report Status 10/03/2024 FINAL  Final   Organism ID, Bacteria METHICILLIN RESISTANT STAPHYLOCOCCUS AUREUS  Final      Susceptibility   Methicillin resistant staphylococcus aureus - MIC*    CIPROFLOXACIN  >=8 RESISTANT Resistant     ERYTHROMYCIN  >=8 RESISTANT Resistant     GENTAMICIN <=0.5 SENSITIVE Sensitive     OXACILLIN >=4 RESISTANT Resistant     TETRACYCLINE <=1 SENSITIVE Sensitive     VANCOMYCIN  <=0.5 SENSITIVE Sensitive     TRIMETH /SULFA  <=10 SENSITIVE Sensitive     CLINDAMYCIN >=8 RESISTANT Resistant     RIFAMPIN  <=0.5 SENSITIVE Sensitive     Inducible Clindamycin NEGATIVE Sensitive     LINEZOLID  2 SENSITIVE Sensitive     *  METHICILLIN RESISTANT STAPHYLOCOCCUS AUREUS  Culture, blood (Routine X 2) w Reflex to ID Panel     Status: None   Collection Time: 10/02/24  6:36 AM   Specimen: BLOOD  Result Value Ref Range Status   Specimen Description BLOOD BLOOD LEFT ARM  Final   Special Requests   Final    BOTTLES DRAWN AEROBIC AND ANAEROBIC Blood Culture adequate volume   Culture   Final    NO GROWTH 5 DAYS Performed at [facility], [address], [place], [place] [address]    Report Status 10/07/2024 FINAL  Final  Culture, blood (Routine X 2) w Reflex to ID Panel     Status: None   Collection Time: 10/02/24  6:36 AM   Specimen: BLOOD  Result Value Ref Range Status   Specimen Description BLOOD BLOOD LEFT ARM  Final   Special  Requests   Final    BOTTLES DRAWN AEROBIC AND ANAEROBIC Blood Culture adequate volume   Culture   Final    NO GROWTH 5 DAYS Performed at [facility], [address], [place], [place] [address]    Report Status 10/07/2024 FINAL  Final  Body fluid culture w Gram Stain     Status: None (Preliminary result)   Collection Time: 10/13/24  2:58 PM   Specimen: PATH Cytology Pleural fluid  Result Value Ref Range Status   Specimen Description   Final    PLEURAL Performed at [facility], [address] [place], [place] [address]    Special Requests   Final    CYTO PLEU Performed at [facility], [address] [place], [place] [address]    Gram Stain   Final    NO WBC SEEN NO ORGANISMS SEEN Performed at [facility], [address], [place], [place] [address]    Culture PENDING  Incomplete   Report Status PENDING  Incomplete    Labs: CBC: Recent Labs  Lab 10/09/24 1606 10/10/24 0650 10/13/24 0509 10/14/24 1420  WBC 7.2 6.0 5.9 6.8  HGB 8.3* 8.6* 8.1* 7.9*  HCT 26.1* 26.4* 25.6* 24.4*  MCV 93.2 93.6 93.4 92.4  PLT 198 200 202 205   Basic Metabolic Panel: Recent Labs  Lab 10/09/24 1606 10/10/24 0650 10/13/24 0509 10/14/24 1420  NA 131* 130* 129* 128*  K 4.3 4.7 4.9 4.8  CL 95* 94* 93* 93*  CO2 25 24 25 24   GLUCOSE 109* 127* 185* 144*  BUN 42* 51* 50* 57*  CREATININE 4.83* 5.54* 4.85* 5.08*  CALCIUM  8.2* 8.4* 8.0* 7.9*  MG 2.1 1.9  --   --   PHOS 4.1 5.3*  --  9.7*   Liver Function Tests: Recent Labs  Lab 10/14/24 1420  ALBUMIN  2.7*   CBG: Recent Labs  Lab 10/13/24 1543 10/13/24 2126 10/14/24 0829 10/14/24 2146 10/15/24 0734  GLUCAP 186* 133* 231* 181* 108*    Discharge time spent: greater than 30 minutes.  Signed: Leita Blanch, MD [facility] 10/15/2024 "

## 2024-10-15 NOTE — Plan of Care (Signed)
" °  Problem: Health Behavior/Discharge Planning: Goal: Ability to identify and utilize available resources and services will improve Outcome: Progressing   Problem: Metabolic: Goal: Ability to maintain appropriate glucose levels will improve Outcome: Progressing   Problem: Nutritional: Goal: Maintenance of adequate nutrition will improve Outcome: Progressing Goal: Progress toward achieving an optimal weight will improve Outcome: Progressing   "

## 2024-10-17 LAB — BODY FLUID CULTURE W GRAM STAIN
Culture: NO GROWTH
Gram Stain: NONE SEEN

## 2024-10-19 ENCOUNTER — Emergency Department

## 2024-10-19 ENCOUNTER — Inpatient Hospital Stay
Admission: EM | Admit: 2024-10-19 | Discharge: 2024-10-29 | DRG: 189 | Disposition: A | Discharge status: Skilled Nursing Facility | Source: Skilled Nursing Facility | Attending: Student | Admitting: Student

## 2024-10-19 DIAGNOSIS — Z66 Do not resuscitate: Secondary | ICD-10-CM | POA: Diagnosis present

## 2024-10-19 DIAGNOSIS — Z89511 Acquired absence of right leg below knee: Secondary | ICD-10-CM

## 2024-10-19 DIAGNOSIS — J9621 Acute and chronic respiratory failure with hypoxia: Principal | ICD-10-CM | POA: Diagnosis present

## 2024-10-19 DIAGNOSIS — J9601 Acute respiratory failure with hypoxia: Secondary | ICD-10-CM | POA: Diagnosis present

## 2024-10-19 DIAGNOSIS — J441 Chronic obstructive pulmonary disease with (acute) exacerbation: Secondary | ICD-10-CM | POA: Diagnosis present

## 2024-10-19 DIAGNOSIS — F17210 Nicotine dependence, cigarettes, uncomplicated: Secondary | ICD-10-CM | POA: Diagnosis present

## 2024-10-19 DIAGNOSIS — Z981 Arthrodesis status: Secondary | ICD-10-CM

## 2024-10-19 DIAGNOSIS — I5033 Acute on chronic diastolic (congestive) heart failure: Secondary | ICD-10-CM | POA: Diagnosis present

## 2024-10-19 DIAGNOSIS — Z794 Long term (current) use of insulin: Secondary | ICD-10-CM

## 2024-10-19 DIAGNOSIS — R7881 Bacteremia: Secondary | ICD-10-CM

## 2024-10-19 DIAGNOSIS — K5903 Drug induced constipation: Secondary | ICD-10-CM | POA: Diagnosis present

## 2024-10-19 DIAGNOSIS — B952 Enterococcus as the cause of diseases classified elsewhere: Secondary | ICD-10-CM | POA: Diagnosis present

## 2024-10-19 DIAGNOSIS — E1165 Type 2 diabetes mellitus with hyperglycemia: Secondary | ICD-10-CM | POA: Diagnosis present

## 2024-10-19 DIAGNOSIS — Z1152 Encounter for screening for COVID-19: Secondary | ICD-10-CM

## 2024-10-19 DIAGNOSIS — E8770 Fluid overload, unspecified: Secondary | ICD-10-CM

## 2024-10-19 DIAGNOSIS — Y712 Prosthetic and other implants, materials and accessory cardiovascular devices associated with adverse incidents: Secondary | ICD-10-CM | POA: Diagnosis present

## 2024-10-19 DIAGNOSIS — F17290 Nicotine dependence, other tobacco product, uncomplicated: Secondary | ICD-10-CM | POA: Diagnosis present

## 2024-10-19 DIAGNOSIS — E039 Hypothyroidism, unspecified: Secondary | ICD-10-CM | POA: Diagnosis present

## 2024-10-19 DIAGNOSIS — J440 Chronic obstructive pulmonary disease with acute lower respiratory infection: Secondary | ICD-10-CM | POA: Diagnosis present

## 2024-10-19 DIAGNOSIS — I509 Heart failure, unspecified: Secondary | ICD-10-CM

## 2024-10-19 DIAGNOSIS — B9562 Methicillin resistant Staphylococcus aureus infection as the cause of diseases classified elsewhere: Secondary | ICD-10-CM | POA: Diagnosis present

## 2024-10-19 DIAGNOSIS — E785 Hyperlipidemia, unspecified: Secondary | ICD-10-CM | POA: Diagnosis present

## 2024-10-19 DIAGNOSIS — I471 Supraventricular tachycardia, unspecified: Secondary | ICD-10-CM | POA: Diagnosis present

## 2024-10-19 DIAGNOSIS — E875 Hyperkalemia: Secondary | ICD-10-CM | POA: Diagnosis present

## 2024-10-19 DIAGNOSIS — T402X5A Adverse effect of other opioids, initial encounter: Secondary | ICD-10-CM | POA: Diagnosis present

## 2024-10-19 DIAGNOSIS — J189 Pneumonia, unspecified organism: Secondary | ICD-10-CM | POA: Diagnosis present

## 2024-10-19 DIAGNOSIS — E1122 Type 2 diabetes mellitus with diabetic chronic kidney disease: Secondary | ICD-10-CM | POA: Diagnosis present

## 2024-10-19 DIAGNOSIS — F419 Anxiety disorder, unspecified: Secondary | ICD-10-CM | POA: Diagnosis present

## 2024-10-19 DIAGNOSIS — N186 End stage renal disease: Secondary | ICD-10-CM | POA: Diagnosis present

## 2024-10-19 DIAGNOSIS — M419 Scoliosis, unspecified: Secondary | ICD-10-CM | POA: Diagnosis present

## 2024-10-19 DIAGNOSIS — E66812 Obesity, class 2: Secondary | ICD-10-CM | POA: Diagnosis present

## 2024-10-19 DIAGNOSIS — G894 Chronic pain syndrome: Secondary | ICD-10-CM | POA: Diagnosis present

## 2024-10-19 DIAGNOSIS — L89156 Pressure-induced deep tissue damage of sacral region: Secondary | ICD-10-CM | POA: Diagnosis present

## 2024-10-19 DIAGNOSIS — N390 Urinary tract infection, site not specified: Secondary | ICD-10-CM | POA: Diagnosis present

## 2024-10-19 DIAGNOSIS — Z6839 Body mass index (BMI) 39.0-39.9, adult: Secondary | ICD-10-CM

## 2024-10-19 DIAGNOSIS — Z8419 Family history of other disorders of kidney and ureter: Secondary | ICD-10-CM

## 2024-10-19 DIAGNOSIS — Z992 Dependence on renal dialysis: Secondary | ICD-10-CM

## 2024-10-19 DIAGNOSIS — A419 Sepsis, unspecified organism: Principal | ICD-10-CM

## 2024-10-19 DIAGNOSIS — K581 Irritable bowel syndrome with constipation: Secondary | ICD-10-CM | POA: Diagnosis present

## 2024-10-19 DIAGNOSIS — F32A Depression, unspecified: Secondary | ICD-10-CM | POA: Diagnosis present

## 2024-10-19 DIAGNOSIS — J9811 Atelectasis: Secondary | ICD-10-CM | POA: Diagnosis present

## 2024-10-19 DIAGNOSIS — E1169 Type 2 diabetes mellitus with other specified complication: Secondary | ICD-10-CM | POA: Diagnosis present

## 2024-10-19 DIAGNOSIS — Z79899 Other long term (current) drug therapy: Secondary | ICD-10-CM

## 2024-10-19 DIAGNOSIS — Z89512 Acquired absence of left leg below knee: Secondary | ICD-10-CM

## 2024-10-19 DIAGNOSIS — T8241XA Breakdown (mechanical) of vascular dialysis catheter, initial encounter: Secondary | ICD-10-CM | POA: Diagnosis present

## 2024-10-19 DIAGNOSIS — J918 Pleural effusion in other conditions classified elsewhere: Secondary | ICD-10-CM | POA: Diagnosis present

## 2024-10-19 DIAGNOSIS — Z9889 Other specified postprocedural states: Secondary | ICD-10-CM

## 2024-10-19 DIAGNOSIS — T380X5A Adverse effect of glucocorticoids and synthetic analogues, initial encounter: Secondary | ICD-10-CM | POA: Diagnosis present

## 2024-10-19 DIAGNOSIS — B1920 Unspecified viral hepatitis C without hepatic coma: Secondary | ICD-10-CM | POA: Diagnosis present

## 2024-10-19 DIAGNOSIS — D631 Anemia in chronic kidney disease: Secondary | ICD-10-CM | POA: Diagnosis present

## 2024-10-19 NOTE — ED Triage Notes (Signed)
 Pt coming from [facility] for SOB; pt is chronically on 6L [place]. Pt coming on 15 L NRB maintaining at 94%

## 2024-10-19 NOTE — ED Provider Notes (Signed)
 "  [facility] Provider Note    Event Date/Time   First MD Initiated Contact with Patient 10/19/24 2337     (approximate)   History   Shortness of Breath  EM Caveat: poor historian, dyspnea  HPI  Eugene [name] is a 64 y.o. male  MRSA positive in 4 out of 4 bottles recent admit  Called out for low oxygen saturation from his care home.  EMS reports they were told that they were having difficulty keeping him his oxygen on 6 L above the high 80s.  Typically oxygenates well with 6 L.  EMS reports have needed nonrebreather to maintain saturations in the mid 90s  Patient reports he is in no new pain.  He cannot provide me any history but he reports he always has some discomfort, but cannot tell me if anything is new.  He does not report feeling short of breath.  Denies any specific pain.  He is able to tell me his other [facility], but does not orient to year.  He does tell me his name without difficulty.  He appears just  EMS reports patient last had dialysis Friday.  Reviewed external records from discharge on January 22 Principal Problem:   SIRS (systemic inflammatory response syndrome) (HCC) Active Problems:   Abdominal discomfort   ESRD (end stage renal disease) (HCC)   Paroxysmal SVT (supraventricular tachycardia)   Heart failure with preserved ejection fraction (HCC)   Chronic respiratory failure with hypoxia (HCC)   Chronic bilateral pleural effusions   Chronic pain   Uncontrolled type 2 diabetes mellitus with hyperglycemia, with long-term current use of insulin  (HCC)   Chronic constipation   Obesity (BMI 30-39.9)   Hypothyroidism   MRSA bacteremia  Past Medical History:  Diagnosis Date   Anxiety    Depression    Diabetes mellitus without complication (HCC)    End stage renal disease (HCC)    Hepatitis C    Hyperlipidemia    IBS (irritable bowel syndrome)    Osteomyelitis (HCC)    Spinal stenosis    Patient noted to have valid  DO NOT RESUSCITATE form (golden rod) with him.  Physical Exam   Triage Vital Signs: ED Triage Vitals  Encounter Vitals Group     BP 10/19/24 2321 (!) 147/73     Girls Systolic BP Percentile --      Girls Diastolic BP Percentile --      Boys Systolic BP Percentile --      Boys Diastolic BP Percentile --      Pulse Rate 10/19/24 2321 78     Resp 10/19/24 2321 20     Temp 10/19/24 2331 (!) 100.9 F (38.3 C)     Temp Source 10/19/24 2331 Axillary     SpO2 10/19/24 2321 95 %     Weight --      Height --      Head Circumference --      Peak Flow --      Pain Score --      Pain Loc --      Pain Education --      Exclude from Growth Chart --     Most recent vital signs: Vitals:   10/20/24 0530 10/20/24 0630  BP: 105/63 (!) 106/50  Pulse: 68 65  Resp:  14  Temp:    SpO2: 96% 96%     General: Awake, moderately tachypneic.  Appears dyspneic.  Mild to moderate accessory muscle use.  Rhonchorous lung sounds on expiration are audible without auscultation and occasional congested cough CV:   Good peripheral perfusion. Normal rate and heart tones. Resp:  Rhonchi.  Protecting airway well.  Mild to moderate increased work of breathing Abd:  Obese slightly distended but soft soft, non-tender to palpation in all quadrants. No rebound or guarding. Neuro:   No focal neuro deficits noted. Moves extremities well without noted concern.  Bilateral lower extremity amputations Other:  No severe appreciable edema   ED Results / Procedures / Treatments   Labs (all labs ordered are listed, but only abnormal results are displayed) Labs Reviewed  COMPREHENSIVE METABOLIC PANEL WITH GFR - Abnormal; Notable for the following components:      Result Value   Sodium 132 (*)    Potassium 5.9 (*)    Chloride 94 (*)    Glucose, Bld 232 (*)    BUN 48 (*)    Creatinine, Ser 5.69 (*)    Calcium  8.4 (*)    Albumin  3.2 (*)    GFR, Estimated 11 (*)    All other components within normal limits  CBC  WITH DIFFERENTIAL/PLATELET - Abnormal; Notable for the following components:   WBC 10.8 (*)    RBC 3.11 (*)    Hemoglobin 9.1 (*)    HCT 29.3 (*)    Abs Immature Granulocytes 0.36 (*)    All other components within normal limits  BLOOD GAS, VENOUS - Abnormal; Notable for the following components:   pO2, Ven 61 (*)    Bicarbonate 28.9 (*)    All other components within normal limits  URINALYSIS, W/ REFLEX TO CULTURE (INFECTION SUSPECTED) - Abnormal; Notable for the following components:   Color, Urine YELLOW (*)    APPearance TURBID (*)    Hgb urine dipstick SMALL (*)    Protein, ur >=300 (*)    Leukocytes,Ua LARGE (*)    Bacteria, UA MANY (*)    All other components within normal limits  PRO BRAIN NATRIURETIC PEPTIDE - Abnormal; Notable for the following components:   Pro Brain Natriuretic Peptide 2,355.0 (*)    All other components within normal limits  TROPONIN T, HIGH SENSITIVITY - Abnormal; Notable for the following components:   Troponin T High Sensitivity 203 (*)    All other components within normal limits  CULTURE, BLOOD (ROUTINE X 2)  CULTURE, BLOOD (ROUTINE X 2)  RESP PANEL BY RT-PCR (RSV, FLU A&B, COVID)  RVPGX2  URINE CULTURE  LACTIC ACID, PLASMA  LACTIC ACID, PLASMA  PROTIME-INR     EKG  I independently reviewed the EKG at 2330 heart rate 75 QRS 100 QTc 420 Sinus rhythm, no evidence of frank ischemia.   RADIOLOGY I independently reviewed images of CXR   DG Chest Port 1 View Result Date: 10/19/2024 EXAM: 1 VIEW(S) XRAY OF THE CHEST 10/19/2024 11:50:22 PM COMPARISON: None available. CLINICAL HISTORY: Questionable sepsis - evaluate for abnormality. Questionable sepsis. Evaluate for abnormality. FINDINGS: LINES, TUBES AND DEVICES: Left dialysis catheter in similar position with tip likely at the confluence of the left brachiocephalic vein and superior vena cava. LUNGS AND PLEURA: Persistent trace bilateral pleural effusions. Persistent Left retrocardiac airspace  opacity. Increased interstitial markings slightly improved. No pneumothorax. HEART AND MEDIASTINUM: No acute abnormality of the cardiac and mediastinal silhouettes. BONES AND SOFT TISSUES: Thoracolumbar surgical hardware. IMPRESSION: 1. Persistent left retrocardiac airspace opacity. 2. Persistent trace bilateral pleural effusions. 3. Increased interstitial markings, slightly improved. Electronically signed by: Morgane Naveau MD 10/19/2024 11:53 PM EST  RP Workstation: [id number]       PROCEDURES:  Critical Care performed: Yes, see critical care procedure note(s) CRITICAL CARE Performed by: Oneil Budge   Total critical care time: 40 minutes  Critical care time was exclusive of separately billable procedures and treating other patients.  Critical care was necessary to treat or prevent imminent or life-threatening deterioration.  Critical care was time spent personally by me on the following activities: development of treatment plan with patient and/or surrogate as well as nursing, discussions with consultants, evaluation of patient's response to treatment, examination of patient, obtaining history from patient or surrogate, ordering and performing treatments and interventions, ordering and review of laboratory studies, ordering and review of radiographic studies, pulse oximetry and re-evaluation of patient's condition.   Procedures ----------------------------------------- 11:52 PM on 10/19/2024 ----------------------------------------- Based on the patient's recent history of MRSA bacteremia and his febrile status I have empirically initiated vancomycin  and code sepsis.  He is at high risk for acute bacterial infection including known recent MRSA.  Plan to broaden antibiotics based on further workup    MEDICATIONS ORDERED IN ED: Medications  ceFEPIme  (MAXIPIME ) 1 g in sodium chloride  0.9 % 100 mL IVPB (0 g Intravenous Stopped 10/20/24 0125)  vancomycin  (VANCOCIN ) IVPB 1000 mg/200 mL  premix (0 mg Intravenous Stopped 10/20/24 0244)  patiromer  (VELTASSA ) packet 25.2 g (16.8 g Oral Given 10/20/24 0305)  furosemide  (LASIX ) injection 60 mg (60 mg Intravenous Given 10/20/24 0241)     IMPRESSION / MDM / ASSESSMENT AND PLAN / ED COURSE  I reviewed the triage vital signs and the nursing notes.                              Based on presentation, the differential diagnosis includes, but is not limited to key considerations: shortness of breath DIFFERENTIAL DIAGNOSIS CONSIDERATIONS: High-acuity etiologies considered and prioritized for rule-out: - Pulmonary Embolism - Acute Decompensated Heart Failure - COPD / Asthma Exacerbation - Tension Pneumothorax - Pneumonia with Sepsis - Upper Airway Obstruction / Anaphylaxis - Acute Coronary Syndrome (Anginal Equivalent) - Metabolic Acidosis (DKA/Sepsis)  This is not an exhaustive list.   Patient's presentation is most consistent with acute complicated illness / injury requiring diagnostic workup.    The patient is on the cardiac monitor to evaluate for evidence of arrhythmia and/or significant heart rate changes.  Clinical Course as of 10/20/24 0719  Tue Oct 20, 2024  0032 Difficult IV access.  Patient resting.  IV team able to obtain labs for blood draw, but currently working to establish peripheral IV for antibiotic therapy.  Patient alert, stability and respiratory pattern without worsening. [MQ]  0034 Further reviewed the patient's records.  His nursing home documentation shows that his full CODE STATUS was discontinued.  He is currently listed as DNR and nursing home documentation as well [MQ]  0035 Called Eddy Liszewski Brother Emergency Contact [phone]    [MQ]  [phone] Spoke with patient's brother, who is closes family.  [MQ]  [phone] Per brother, no legal HCPOA, but brother makes decisins when he can not. Brother advises feels patient actually most recently wanted to be FULL CODE. Will make full code.  [MQ]  0044  Discussed risks, benefits, alternatives for CENTAL line with brother. Patient  [MQ]  0045 Borther agrees/consents (via phone) to central line insertion.  [MQ]  0045 Patient's brother also discussed consent for which she approves with nurse RN Greig Monarch for verification.  [MQ]  [phone]  FULL CODE status now initiated. [MQ]  0138 Patient is doing well.  He is tolerating high flow nasal cannula well.  Given his degree of hypoxia and need for high flow nasal cannula, as well as concerns for sepsis I have consult to ICU team with a request for admission at this time. [MQ]  0142 Discussed case and admission request to ICU team. Britton-Lee to come and eval for dispo (advising likely hospitalist, but will see patient in person shortly) [MQ]  [phone] Notification to Dr. Lawence for admission request made via [facility] [bed placement advised they had overlooked the consult from 1.5 hours ago] [MQ]    Clinical Course User Index [MQ] Dicky Anes, MD     FINAL CLINICAL IMPRESSION(S) / ED DIAGNOSES   Final diagnoses:  Sepsis, due to unspecified organism, unspecified whether acute organ dysfunction present (HCC)  MRSA bacteremia  Hypervolemia, unspecified hypervolemia type     Rx / DC Orders   ED Discharge Orders     None        Note:  This document was prepared using Dragon voice recognition software and may include unintentional dictation errors.   Dicky Anes, MD 10/20/24 [phone]  "

## 2024-10-19 NOTE — Sepsis Progress Note (Signed)
 Elink monitoring for the code sepsis protocol.

## 2024-10-20 ENCOUNTER — Other Ambulatory Visit: Payer: Self-pay

## 2024-10-20 DIAGNOSIS — I509 Heart failure, unspecified: Secondary | ICD-10-CM

## 2024-10-20 DIAGNOSIS — J9601 Acute respiratory failure with hypoxia: Secondary | ICD-10-CM | POA: Diagnosis not present

## 2024-10-20 LAB — CBC WITH DIFFERENTIAL/PLATELET
Abs Immature Granulocytes: 0.36 10*3/uL — ABNORMAL HIGH (ref 0.00–0.07)
Basophils Absolute: 0.1 10*3/uL (ref 0.0–0.1)
Basophils Relative: 1 %
Eosinophils Absolute: 0.3 10*3/uL (ref 0.0–0.5)
Eosinophils Relative: 3 %
HCT: 29.3 % — ABNORMAL LOW (ref 39.0–52.0)
Hemoglobin: 9.1 g/dL — ABNORMAL LOW (ref 13.0–17.0)
Immature Granulocytes: 3 %
Lymphocytes Relative: 20 %
Lymphs Abs: 2.2 10*3/uL (ref 0.7–4.0)
MCH: 29.3 pg (ref 26.0–34.0)
MCHC: 31.1 g/dL (ref 30.0–36.0)
MCV: 94.2 fL (ref 80.0–100.0)
Monocytes Absolute: 1 10*3/uL (ref 0.1–1.0)
Monocytes Relative: 10 %
Neutro Abs: 6.9 10*3/uL (ref 1.7–7.7)
Neutrophils Relative %: 63 %
Platelets: 257 10*3/uL (ref 150–400)
RBC: 3.11 MIL/uL — ABNORMAL LOW (ref 4.22–5.81)
RDW: 15 % (ref 11.5–15.5)
WBC: 10.8 10*3/uL — ABNORMAL HIGH (ref 4.0–10.5)
nRBC: 0 % (ref 0.0–0.2)

## 2024-10-20 LAB — COMPREHENSIVE METABOLIC PANEL WITH GFR
ALT: 17 U/L (ref 0–44)
AST: 27 U/L (ref 15–41)
Albumin: 3.2 g/dL — ABNORMAL LOW (ref 3.5–5.0)
Alkaline Phosphatase: 81 U/L (ref 38–126)
Anion gap: 14 (ref 5–15)
BUN: 48 mg/dL — ABNORMAL HIGH (ref 8–23)
CO2: 25 mmol/L (ref 22–32)
Calcium: 8.4 mg/dL — ABNORMAL LOW (ref 8.9–10.3)
Chloride: 94 mmol/L — ABNORMAL LOW (ref 98–111)
Creatinine, Ser: 5.69 mg/dL — ABNORMAL HIGH (ref 0.61–1.24)
GFR, Estimated: 11 mL/min — ABNORMAL LOW
Glucose, Bld: 232 mg/dL — ABNORMAL HIGH (ref 70–99)
Potassium: 5.9 mmol/L — ABNORMAL HIGH (ref 3.5–5.1)
Sodium: 132 mmol/L — ABNORMAL LOW (ref 135–145)
Total Bilirubin: 0.4 mg/dL (ref 0.0–1.2)
Total Protein: 8.1 g/dL (ref 6.5–8.1)

## 2024-10-20 LAB — BLOOD GAS, VENOUS
Acid-Base Excess: 1.6 mmol/L (ref 0.0–2.0)
Bicarbonate: 28.9 mmol/L — ABNORMAL HIGH (ref 20.0–28.0)
O2 Saturation: 90.5 %
Patient temperature: 37
pCO2, Ven: 56 mmHg (ref 44–60)
pH, Ven: 7.32 (ref 7.25–7.43)
pO2, Ven: 61 mmHg — ABNORMAL HIGH (ref 32–45)

## 2024-10-20 LAB — GLUCOSE, CAPILLARY
Glucose-Capillary: 298 mg/dL — ABNORMAL HIGH (ref 70–99)
Glucose-Capillary: 322 mg/dL — ABNORMAL HIGH (ref 70–99)

## 2024-10-20 LAB — URINALYSIS, W/ REFLEX TO CULTURE (INFECTION SUSPECTED)
Bilirubin Urine: NEGATIVE
Glucose, UA: NEGATIVE mg/dL
Ketones, ur: NEGATIVE mg/dL
Nitrite: NEGATIVE
Protein, ur: >=300 mg/dL — AB
Specific Gravity, Urine: 1.024 (ref 1.005–1.030)
WBC, UA: >50 WBC/hpf (ref 0–5)
pH: 7 (ref 5.0–8.0)

## 2024-10-20 LAB — BASIC METABOLIC PANEL WITH GFR
Anion gap: 11 (ref 5–15)
BUN: 55 mg/dL — ABNORMAL HIGH (ref 8–23)
CO2: 25 mmol/L (ref 22–32)
Calcium: 7.8 mg/dL — ABNORMAL LOW (ref 8.9–10.3)
Chloride: 94 mmol/L — ABNORMAL LOW (ref 98–111)
Creatinine, Ser: 5.84 mg/dL — ABNORMAL HIGH (ref 0.61–1.24)
GFR, Estimated: 10 mL/min — ABNORMAL LOW
Glucose, Bld: 350 mg/dL — ABNORMAL HIGH (ref 70–99)
Potassium: 5.6 mmol/L — ABNORMAL HIGH (ref 3.5–5.1)
Sodium: 130 mmol/L — ABNORMAL LOW (ref 135–145)

## 2024-10-20 LAB — RESP PANEL BY RT-PCR (RSV, FLU A&B, COVID)  RVPGX2
Influenza A by PCR: NEGATIVE
Influenza B by PCR: NEGATIVE
Resp Syncytial Virus by PCR: NEGATIVE
SARS Coronavirus 2 by RT PCR: NEGATIVE

## 2024-10-20 LAB — LACTIC ACID, PLASMA
Lactic Acid, Venous: 1 mmol/L (ref 0.5–1.9)
Lactic Acid, Venous: 1.2 mmol/L (ref 0.5–1.9)

## 2024-10-20 LAB — HEPATITIS B SURFACE ANTIGEN: Hepatitis B Surface Ag: NONREACTIVE

## 2024-10-20 LAB — CBG MONITORING, ED: Glucose-Capillary: 249 mg/dL — ABNORMAL HIGH (ref 70–99)

## 2024-10-20 LAB — TROPONIN T, HIGH SENSITIVITY: Troponin T High Sensitivity: 203 ng/L (ref 0–19)

## 2024-10-20 LAB — PROTIME-INR
INR: 0.9 (ref 0.8–1.2)
Prothrombin Time: 13.2 s (ref 11.4–15.2)

## 2024-10-20 LAB — PRO BRAIN NATRIURETIC PEPTIDE: Pro Brain Natriuretic Peptide: 2355 pg/mL — ABNORMAL HIGH

## 2024-10-20 MED ORDER — DOXEPIN HCL 10 MG PO CAPS
10.0000 mg | ORAL_CAPSULE | Freq: Every day | ORAL | Status: AC
  Administered 2024-10-20 – 2024-10-28 (×9): 10 mg via ORAL
  Filled 2024-10-20 (×11): qty 1

## 2024-10-20 MED ORDER — LEVOTHYROXINE SODIUM 50 MCG PO TABS
175.0000 ug | ORAL_TABLET | Freq: Every day | ORAL | Status: DC
  Administered 2024-10-20 – 2024-10-28 (×9): 175 ug via ORAL
  Filled 2024-10-20 (×9): qty 1

## 2024-10-20 MED ORDER — OXYCODONE HCL 5 MG PO TABS
7.5000 mg | ORAL_TABLET | Freq: Four times a day (QID) | ORAL | Status: DC | PRN
  Administered 2024-10-20: 7.5 mg via ORAL
  Filled 2024-10-20: qty 2

## 2024-10-20 MED ORDER — VANCOMYCIN HCL IN DEXTROSE 1-5 GM/200ML-% IV SOLN: 1000.0000 mg | Freq: Once | INTRAVENOUS | Status: DC

## 2024-10-20 MED ORDER — MELATONIN 5 MG PO TABS
10.0000 mg | ORAL_TABLET | Freq: Every day | ORAL | Status: AC
  Administered 2024-10-20 – 2024-10-28 (×9): 10 mg via ORAL
  Filled 2024-10-20 (×9): qty 2

## 2024-10-20 MED ORDER — ACETAMINOPHEN 325 MG PO TABS
650.0000 mg | ORAL_TABLET | Freq: Four times a day (QID) | ORAL | Status: DC | PRN
  Filled 2024-10-20: qty 2

## 2024-10-20 MED ORDER — SODIUM CHLORIDE 0.9 % IV SOLN
1.0000 g | Freq: Once | INTRAVENOUS | Status: AC
  Administered 2024-10-20: 1 g via INTRAVENOUS
  Filled 2024-10-20: qty 10

## 2024-10-20 MED ORDER — MIDODRINE HCL 5 MG PO TABS
20.0000 mg | ORAL_TABLET | Freq: Once | ORAL | Status: AC
  Administered 2024-10-22: 20 mg via ORAL
  Filled 2024-10-20: qty 4

## 2024-10-20 MED ORDER — VANCOMYCIN HCL 1500 MG/300ML IV SOLN: 1500.0000 mg | Freq: Once | INTRAVENOUS | Status: DC

## 2024-10-20 MED ORDER — CALCITRIOL 0.25 MCG PO CAPS
0.5000 ug | ORAL_CAPSULE | Freq: Every day | ORAL | Status: AC
  Administered 2024-10-20 – 2024-10-29 (×10): 0.5 ug via ORAL
  Filled 2024-10-20 (×10): qty 2

## 2024-10-20 MED ORDER — PREDNISONE 10 MG PO TABS
10.0000 mg | ORAL_TABLET | Freq: Every day | ORAL | Status: DC
  Administered 2024-10-20 – 2024-10-21 (×2): 10 mg via ORAL
  Filled 2024-10-20 (×2): qty 1

## 2024-10-20 MED ORDER — LACTULOSE 10 GM/15ML PO SOLN
20.0000 g | Freq: Two times a day (BID) | ORAL | Status: AC
  Administered 2024-10-20 – 2024-10-27 (×11): 20 g via ORAL
  Filled 2024-10-20 (×17): qty 30

## 2024-10-20 MED ORDER — POLYETHYLENE GLYCOL 3350 17 G PO PACK
17.0000 g | PACK | Freq: Two times a day (BID) | ORAL | Status: DC
  Administered 2024-10-20 – 2024-10-25 (×10): 17 g via ORAL
  Filled 2024-10-20 (×17): qty 1

## 2024-10-20 MED ORDER — FUROSEMIDE 10 MG/ML IJ SOLN
60.0000 mg | Freq: Once | INTRAMUSCULAR | Status: AC
  Administered 2024-10-20: 60 mg via INTRAVENOUS
  Filled 2024-10-20: qty 8

## 2024-10-20 MED ORDER — VANCOMYCIN IV (FOR PTA / DISCHARGE USE ONLY): 1000.0000 mg | INTRAVENOUS | Status: DC

## 2024-10-20 MED ORDER — LORATADINE 10 MG PO TABS
10.0000 mg | ORAL_TABLET | Freq: Every day | ORAL | Status: AC
  Administered 2024-10-20 – 2024-10-29 (×10): 10 mg via ORAL
  Filled 2024-10-20 (×10): qty 1

## 2024-10-20 MED ORDER — INSULIN ASPART 100 UNIT/ML IJ SOLN
0.0000 [IU] | Freq: Three times a day (TID) | INTRAMUSCULAR | Status: DC
  Administered 2024-10-20: 3 [IU] via SUBCUTANEOUS
  Administered 2024-10-20: 5 [IU] via SUBCUTANEOUS
  Administered 2024-10-21: 7 [IU] via SUBCUTANEOUS
  Administered 2024-10-21 – 2024-10-22 (×3): 5 [IU] via SUBCUTANEOUS
  Administered 2024-10-22: 9 [IU] via SUBCUTANEOUS
  Administered 2024-10-23 (×2): 3 [IU] via SUBCUTANEOUS
  Administered 2024-10-23: 7 [IU] via SUBCUTANEOUS
  Administered 2024-10-24: 2 [IU] via SUBCUTANEOUS
  Administered 2024-10-24 – 2024-10-25 (×3): 9 [IU] via SUBCUTANEOUS
  Administered 2024-10-26: 7 [IU] via SUBCUTANEOUS
  Administered 2024-10-27: 9 [IU] via SUBCUTANEOUS
  Administered 2024-10-27: 5 [IU] via SUBCUTANEOUS
  Administered 2024-10-27 – 2024-10-28 (×2): 7 [IU] via SUBCUTANEOUS
  Administered 2024-10-28: 5 [IU] via SUBCUTANEOUS
  Administered 2024-10-28: 9 [IU] via SUBCUTANEOUS
  Administered 2024-10-29: 7 [IU] via SUBCUTANEOUS
  Administered 2024-10-29: 9 [IU] via SUBCUTANEOUS
  Filled 2024-10-20: qty 5
  Filled 2024-10-20: qty 1
  Filled 2024-10-20 (×3): qty 3
  Filled 2024-10-20 (×2): qty 7
  Filled 2024-10-20: qty 9
  Filled 2024-10-20: qty 2
  Filled 2024-10-20: qty 9
  Filled 2024-10-20: qty 5
  Filled 2024-10-20: qty 7
  Filled 2024-10-20: qty 5
  Filled 2024-10-20: qty 7
  Filled 2024-10-20 (×2): qty 5
  Filled 2024-10-20: qty 9
  Filled 2024-10-20: qty 7
  Filled 2024-10-20: qty 2
  Filled 2024-10-20 (×2): qty 3
  Filled 2024-10-20: qty 9
  Filled 2024-10-20: qty 2
  Filled 2024-10-20 (×2): qty 7
  Filled 2024-10-20: qty 5

## 2024-10-20 MED ORDER — SODIUM CHLORIDE 0.9% FLUSH
3.0000 mL | Freq: Two times a day (BID) | INTRAVENOUS | Status: AC
  Administered 2024-10-20 – 2024-10-29 (×16): 3 mL via INTRAVENOUS

## 2024-10-20 MED ORDER — SIMETHICONE 80 MG PO CHEW
80.0000 mg | CHEWABLE_TABLET | Freq: Four times a day (QID) | ORAL | Status: AC
  Administered 2024-10-20 – 2024-10-29 (×33): 80 mg via ORAL
  Filled 2024-10-20 (×36): qty 1

## 2024-10-20 MED ORDER — FUROSEMIDE 40 MG PO TABS: 80.0000 mg | ORAL_TABLET | Freq: Every day | ORAL | Status: DC

## 2024-10-20 MED ORDER — ACETAMINOPHEN 325 MG PO TABS: 650.0000 mg | ORAL_TABLET | ORAL | Status: DC | PRN

## 2024-10-20 MED ORDER — SENNOSIDES-DOCUSATE SODIUM 8.6-50 MG PO TABS
1.0000 | ORAL_TABLET | Freq: Two times a day (BID) | ORAL | Status: DC
  Administered 2024-10-20 – 2024-10-29 (×16): 1 via ORAL
  Filled 2024-10-20 (×19): qty 1

## 2024-10-20 MED ORDER — ONDANSETRON HCL 4 MG/2ML IJ SOLN
4.0000 mg | Freq: Four times a day (QID) | INTRAMUSCULAR | Status: DC | PRN
  Administered 2024-10-21 – 2024-10-29 (×8): 4 mg via INTRAVENOUS
  Filled 2024-10-20 (×7): qty 2

## 2024-10-20 MED ORDER — LINACLOTIDE 145 MCG PO CAPS
145.0000 ug | ORAL_CAPSULE | Freq: Every day | ORAL | Status: AC
  Administered 2024-10-20 – 2024-10-29 (×10): 145 ug via ORAL
  Filled 2024-10-20 (×10): qty 1

## 2024-10-20 MED ORDER — INSULIN GLARGINE-YFGN 100 UNIT/ML ~~LOC~~ SOLN
20.0000 [IU] | Freq: Every day | SUBCUTANEOUS | Status: DC
  Administered 2024-10-20 – 2024-10-21 (×2): 20 [IU] via SUBCUTANEOUS
  Filled 2024-10-20 (×3): qty 0.2

## 2024-10-20 MED ORDER — METOCLOPRAMIDE HCL 5 MG/ML IJ SOLN
5.0000 mg | Freq: Four times a day (QID) | INTRAMUSCULAR | Status: AC | PRN
  Administered 2024-10-25 – 2024-10-26 (×2): 5 mg via INTRAVENOUS
  Filled 2024-10-20 (×2): qty 2

## 2024-10-20 MED ORDER — CHLORHEXIDINE GLUCONATE CLOTH 2 % EX PADS
6.0000 | MEDICATED_PAD | Freq: Every day | CUTANEOUS | Status: DC
  Administered 2024-10-22 – 2024-10-27 (×6): 6 via TOPICAL
  Filled 2024-10-20: qty 6

## 2024-10-20 MED ORDER — CARVEDILOL 6.25 MG PO TABS
6.2500 mg | ORAL_TABLET | Freq: Two times a day (BID) | ORAL | Status: DC
  Administered 2024-10-20 – 2024-10-29 (×14): 6.25 mg via ORAL
  Filled 2024-10-20 (×17): qty 1

## 2024-10-20 MED ORDER — DOXEPIN HCL 6 MG PO TABS: 6.0000 mg | ORAL_TABLET | Freq: Every day | ORAL | Status: DC

## 2024-10-20 MED ORDER — VANCOMYCIN HCL IN DEXTROSE 1-5 GM/200ML-% IV SOLN
1000.0000 mg | INTRAVENOUS | Status: DC
  Filled 2024-10-20: qty 200

## 2024-10-20 MED ORDER — BISACODYL 10 MG RE SUPP: 10.0000 mg | Freq: Every day | RECTAL | Status: AC | PRN

## 2024-10-20 MED ORDER — FAMOTIDINE 20 MG PO TABS
10.0000 mg | ORAL_TABLET | Freq: Every day | ORAL | Status: DC
  Administered 2024-10-20 – 2024-10-29 (×10): 10 mg via ORAL
  Filled 2024-10-20 (×10): qty 1

## 2024-10-20 MED ORDER — SODIUM CHLORIDE 0.9 % IV SOLN
100.0000 mg | Freq: Two times a day (BID) | INTRAVENOUS | Status: DC
  Administered 2024-10-20 – 2024-10-21 (×4): 100 mg via INTRAVENOUS
  Filled 2024-10-20 (×5): qty 100

## 2024-10-20 MED ORDER — DILTIAZEM HCL ER COATED BEADS 120 MG PO CP24
240.0000 mg | ORAL_CAPSULE | Freq: Every evening | ORAL | Status: DC
  Administered 2024-10-20 – 2024-10-28 (×8): 240 mg via ORAL
  Filled 2024-10-20 (×3): qty 2
  Filled 2024-10-20: qty 1
  Filled 2024-10-20 (×5): qty 2

## 2024-10-20 MED ORDER — FUROSEMIDE 10 MG/ML IJ SOLN
100.0000 mg | Freq: Two times a day (BID) | INTRAVENOUS | Status: DC
  Administered 2024-10-20 – 2024-10-21 (×3): 100 mg via INTRAVENOUS
  Filled 2024-10-20 (×5): qty 10

## 2024-10-20 MED ORDER — BISACODYL 5 MG PO TBEC
10.0000 mg | DELAYED_RELEASE_TABLET | Freq: Every day | ORAL | Status: DC
  Administered 2024-10-20 – 2024-10-26 (×7): 10 mg via ORAL
  Filled 2024-10-20 (×8): qty 2

## 2024-10-20 MED ORDER — GABAPENTIN 300 MG PO CAPS
300.0000 mg | ORAL_CAPSULE | Freq: Every day | ORAL | Status: AC
  Administered 2024-10-20 – 2024-10-28 (×9): 300 mg via ORAL
  Filled 2024-10-20 (×9): qty 1

## 2024-10-20 MED ORDER — SODIUM CHLORIDE 0.9% FLUSH: 3.0000 mL | INTRAVENOUS | Status: AC | PRN

## 2024-10-20 MED ORDER — MIDODRINE HCL 5 MG PO TABS
20.0000 mg | ORAL_TABLET | ORAL | Status: AC
  Administered 2024-10-23: 20 mg via ORAL
  Filled 2024-10-20 (×3): qty 4

## 2024-10-20 MED ORDER — VANCOMYCIN HCL IN DEXTROSE 1-5 GM/200ML-% IV SOLN
1000.0000 mg | Freq: Once | INTRAVENOUS | Status: AC
  Administered 2024-10-20: 1000 mg via INTRAVENOUS
  Filled 2024-10-20: qty 200

## 2024-10-20 MED ORDER — IPRATROPIUM-ALBUTEROL 0.5-2.5 (3) MG/3ML IN SOLN
3.0000 mL | Freq: Four times a day (QID) | RESPIRATORY_TRACT | Status: DC | PRN
  Administered 2024-10-21 – 2024-10-23 (×5): 3 mL via RESPIRATORY_TRACT
  Filled 2024-10-20 (×7): qty 3

## 2024-10-20 MED ORDER — SODIUM CHLORIDE 0.9 % IV SOLN: 2.0000 g | Freq: Once | INTRAVENOUS | Status: DC

## 2024-10-20 MED ORDER — SODIUM CHLORIDE 0.9 % IV SOLN: 250.0000 mL | INTRAVENOUS | Status: AC | PRN

## 2024-10-20 MED ORDER — PATIROMER SORBITEX CALCIUM 8.4 G PO PACK
25.2000 g | PACK | ORAL | Status: AC
  Administered 2024-10-20: 16.8 g via ORAL
  Filled 2024-10-20: qty 3

## 2024-10-20 MED ORDER — HEPARIN SODIUM (PORCINE) 5000 UNIT/ML IJ SOLN
5000.0000 [IU] | Freq: Two times a day (BID) | INTRAMUSCULAR | Status: DC
  Administered 2024-10-20 – 2024-10-29 (×18): 5000 [IU] via SUBCUTANEOUS
  Filled 2024-10-20 (×18): qty 1

## 2024-10-20 NOTE — Progress Notes (Signed)
 Asked by EDP to evaluate patient for ICU admission. Patient presented with main complaint of dyspnea, patient is chronically on 6 L [place]. Upon arrival to ED patient was requiring NRB but eventually was stabilized on 10 L HFNC.   Upon bedside assessment, patient is Alert and responsive- non-toxic appearing- without dyspnea on 10 L HFNC. Vitals are stable, not on vasopressor support. EDP getting ready to consult nephrology for possible dialysis need.  As his respiratory status is improved, and should continue to improve with iHD, patient does not meet ICU admission criteria at this time. Please re-consult if needed in the future.       Jenita Ruth Rust-Chester, AGACNP-BC Acute Care Nurse Practitioner [facility]    [phone] / [phone] Please see Amion for pager details.

## 2024-10-20 NOTE — ED Notes (Signed)
 Pt called out due to breakfast not being delivered. This RN informed pt that she would call and make sure his breakfast was coming. Pt yelling at RN and stating You're trying to starve me. Breakfast tray arrived. Pt continued to yell at this RN and refused to take midodrine . Pt told RN, You can't tell me what pills I need to take. Dialysis nurse made aware that pt refused midodrine .

## 2024-10-20 NOTE — Progress Notes (Signed)
 ED Pharmacy Antibiotic Sign Off An antibiotic consult was received from an ED provider for Vancomycin , Cefepime  per pharmacy dosing for sepsis. A chart review was completed to assess appropriateness.   Pt was on Vanc 1 gm Q MWF with HD sessions, did not have HD on 1/26   The following one time order(s) were placed:  Vancomycin  1 gm IV X 1 and Cefepime  1 gm IV X 1   Further antibiotic and/or antibiotic pharmacy consults should be ordered by the admitting provider if indicated.   Thank you for allowing pharmacy to be a part of this patient's care.   Eugene [name] [facility]  Clinical Pharmacist 10/20/24 12:32 AM

## 2024-10-20 NOTE — Consult Note (Signed)
 Pharmacy Antibiotic Note  Eugene [name] is a 64 y.o. male admitted on 10/19/2024 with bacteremia.  PMH includes MRSA bacteremia on vancomycin , ESRD on HD MWF, chronic hypoxic respiratory failure on 6 L continuously, bilateral BKA, chronic HFpEF, IDDM, HTN, HLD<hypothyroidism, chronic pain syndrome, obesity,  Pharmacy has been consulted for Vancomycin  dosing. Patient recently discharged with OPAT (see below)  Indication: MRSA bacteremia Regimen: vancomycin  1000mg  IV with HD on MWF* End date: 11/11/2024  Patient got extra Vancomycin  dose on Tuesday Jan/27 but refusing HD today.   Plan: Resume Vancomycin  1000mg  IV every M-W-F at dialysis Will order random Vancomycin  level with 1/28 AM labs due to extra 1000mg  Vancomycin  given 1/27 Follow cultures and clinical changes to adjust therapy as needed  Temp (24hrs), Avg:100.9 F (38.3 C), Min:100.9 F (38.3 C), Max:100.9 F (38.3 C)  Recent Labs  Lab 10/14/24 1420 10/20/24 0025 10/20/24 0213  WBC 6.8 10.8*  --   CREATININE 5.08* 5.69*  --   LATICACIDVEN  --  1.2 1.0    Estimated Creatinine Clearance: 18 mL/min (A) (by C-G formula based on SCr of 5.69 mg/dL (H)).    Allergies[1]  Antimicrobials this admission: Vancomycin  1/27 >> (end date 2/18)   Microbiology results: 1/26 Bcx collected (in process) 1/8 BCx: MRSA bacteremia   Thank you for allowing pharmacy to be a part of this patients care.  Trinisha Paget Rodriguez-Guzman PharmD, BCPS 10/20/2024 11:25 AM     [1] No Known Allergies

## 2024-10-20 NOTE — Progress Notes (Signed)
 " [facility]  ROUNDING NOTE   Subjective:   Eugene [name] is a 64 year old male with past medical conditions including diabetes, hepatitis C, bilateral BKA, anxiety and depression, and end-stage renal disease on hemodialysis.  Patient presents to the emergency department from his facility complaining of shortness of breath.  Patient is currently admitted for Acute respiratory failure with hypoxia (HCC) [J96.01] CHF (congestive heart failure) (HCC) [I50.9]  Patient is known to our practice and receives outpatient dialysis treatments at [facility] on a MWF schedule, supervised by Dr. Dennise.  Patient missed dialysis treatment on yesterday at outpatient clinic.  Patient seen resting on stretcher, high flow nasal cannula in place at 14 to 15 L.  Patient adamant he will not complete dialysis treatment today.  Noncommittal to offer for dialysis tomorrow.  Labs on ED arrival concerning for sodium 132, potassium 5.9, BUN 48, and BNP greater than 2300.  White count 10.8 with hemoglobin 9.1.  Blood cultures and urine culture pending.  Chest x-ray shows persistent left airspace opacities, trace bilateral pleural effusions, and increased interstitial markings.  We have been consulted to manage dialysis needs during this admission.   Objective:  Vital signs in last 24 hours:  Temp:  [100.9 F (38.3 C)] 100.9 F (38.3 C) (01/26 2331) Pulse Rate:  [65-85] 65 (01/27 0630) Resp:  [10-20] 14 (01/27 0630) BP: (105-147)/(50-81) 106/50 (01/27 0630) SpO2:  [89 %-97 %] 96 % (01/27 0630)  Weight change:  There were no vitals filed for this visit.   Intake/Output: I/O last 3 completed shifts: In: 300 [IV Piggyback:300] Out: -    Intake/Output this shift:  No intake/output data recorded.  Physical Exam: General: NAD  Head: Normocephalic  Eyes: Anicteric  Lungs:  Coarse, 14 L HFNC  Heart: Regular rate and rhythm  Abdomen:  Soft, nontender  Extremities: No edema.  Bilateral BKA   Neurologic: Awake, alert, conversant  Skin: Warm,dry, no rash  Access: Lt internal jugular permcath    Basic Metabolic Panel: Recent Labs  Lab 10/14/24 1420 10/20/24 0025  NA 128* 132*  K 4.8 5.9*  CL 93* 94*  CO2 24 25  GLUCOSE 144* 232*  BUN 57* 48*  CREATININE 5.08* 5.69*  CALCIUM  7.9* 8.4*  PHOS 9.7*  --     Liver Function Tests: Recent Labs  Lab 10/14/24 1420 10/20/24 0025  AST  --  27  ALT  --  17  ALKPHOS  --  81  BILITOT  --  0.4  PROT  --  8.1  ALBUMIN  2.7* 3.2*   No results for input(s): LIPASE, AMYLASE in the last 168 hours.  No results for input(s): AMMONIA in the last 168 hours.  CBC: Recent Labs  Lab 10/14/24 1420 10/20/24 0025  WBC 6.8 10.8*  NEUTROABS  --  6.9  HGB 7.9* 9.1*  HCT 24.4* 29.3*  MCV 92.4 94.2  PLT 205 257    Cardiac Enzymes: No results for input(s): CKTOTAL, CKMB, CKMBINDEX, TROPONINI in the last 168 hours.  BNP: Invalid input(s): POCBNP  CBG: Recent Labs  Lab 10/13/24 2126 10/14/24 0829 10/14/24 2146 10/15/24 0734 10/20/24 1237  GLUCAP 133* 231* 181* 108* 249*    Microbiology: Results for orders placed or performed during the hospital encounter of 10/19/24  Blood Culture (routine x 2)     Status: None (Preliminary result)   Collection Time: 10/19/24 11:53 PM   Specimen: BLOOD  Result Value Ref Range Status   Specimen Description BLOOD RIGHT UPPER ARM  Final   Special Requests   Final    BOTTLES DRAWN AEROBIC AND ANAEROBIC Blood Culture results may not be optimal due to an inadequate volume of blood received in culture bottles   Culture   Final    NO GROWTH < 12 HOURS Performed at [facility], [address], [place], [place] [address]    Report Status PENDING  Incomplete  Resp panel by RT-PCR (RSV, Flu A&B, Covid) Anterior Nasal Swab     Status: None   Collection Time: 10/19/24 11:53 PM   Specimen: Anterior Nasal Swab  Result Value Ref Range Status   SARS Coronavirus 2 by  RT PCR NEGATIVE NEGATIVE Final    Comment: (NOTE) SARS-CoV-2 target nucleic acids are NOT DETECTED.  The SARS-CoV-2 RNA is generally detectable in upper respiratory specimens during the acute phase of infection. The lowest concentration of SARS-CoV-2 viral copies this assay can detect is 138 copies/mL. A negative result does not preclude SARS-Cov-2 infection and should not be used as the sole basis for treatment or other patient management decisions. A negative result may occur with  improper specimen collection/handling, submission of specimen other than nasopharyngeal swab, presence of viral mutation(s) within the areas targeted by this assay, and inadequate number of viral copies(<138 copies/mL). A negative result must be combined with clinical observations, patient history, and epidemiological information. The expected result is Negative.  Fact Sheet for Patients:  bloggercourse.com  Fact Sheet for Healthcare Providers:  seriousbroker.it  This test is no t yet approved or cleared by the [place]  FDA and  has been authorized for detection and/or diagnosis of SARS-CoV-2 by FDA under an Emergency Use Authorization (EUA). This EUA will remain  in effect (meaning this test can be used) for the duration of the COVID-19 declaration under Section 564(b)(1) of the Act, 21 U.S.C.section 360bbb-3(b)(1), unless the authorization is terminated  or revoked sooner.       Influenza A by PCR NEGATIVE NEGATIVE Final   Influenza B by PCR NEGATIVE NEGATIVE Final    Comment: (NOTE) The Xpert Xpress SARS-CoV-2/FLU/RSV plus assay is intended as an aid in the diagnosis of influenza from Nasopharyngeal swab specimens and should not be used as a sole basis for treatment. Nasal washings and aspirates are unacceptable for Xpert Xpress SARS-CoV-2/FLU/RSV testing.  Fact Sheet for Patients: bloggercourse.com  Fact Sheet  for Healthcare Providers: seriousbroker.it  This test is not yet approved or cleared by the [place]  FDA and has been authorized for detection and/or diagnosis of SARS-CoV-2 by FDA under an Emergency Use Authorization (EUA). This EUA will remain in effect (meaning this test can be used) for the duration of the COVID-19 declaration under Section 564(b)(1) of the Act, 21 U.S.C. section 360bbb-3(b)(1), unless the authorization is terminated or revoked.     Resp Syncytial Virus by PCR NEGATIVE NEGATIVE Final    Comment: (NOTE) Fact Sheet for Patients: bloggercourse.com  Fact Sheet for Healthcare Providers: seriousbroker.it  This test is not yet approved or cleared by the [place]  FDA and has been authorized for detection and/or diagnosis of SARS-CoV-2 by FDA under an Emergency Use Authorization (EUA). This EUA will remain in effect (meaning this test can be used) for the duration of the COVID-19 declaration under Section 564(b)(1) of the Act, 21 U.S.C. section 360bbb-3(b)(1), unless the authorization is terminated or revoked.  Performed at [facility], [address], [place], [place] [address]   Blood Culture (routine x 2)     Status: None (Preliminary  result)   Collection Time: 10/20/24 12:26 AM   Specimen: BLOOD  Result Value Ref Range Status   Specimen Description BLOOD LEFT UPPER ARM  Final   Special Requests   Final    BOTTLES DRAWN AEROBIC AND ANAEROBIC Blood Culture results may not be optimal due to an inadequate volume of blood received in culture bottles   Culture   Final    NO GROWTH < 12 HOURS Performed at [facility], [address] [place], [place] [address]    Report Status PENDING  Incomplete    Coagulation Studies: Recent Labs    10/20/24 0025  LABPROT 13.2  INR 0.9     Urinalysis: Recent Labs    10/20/24 0058  COLORURINE YELLOW*  LABSPEC  1.024  PHURINE 7.0  GLUCOSEU NEGATIVE  HGBUR SMALL*  BILIRUBINUR NEGATIVE  KETONESUR NEGATIVE  PROTEINUR >=300*  NITRITE NEGATIVE  LEUKOCYTESUR LARGE*       Imaging: DG Chest Port 1 View Result Date: 10/19/2024 EXAM: 1 VIEW(S) XRAY OF THE CHEST 10/19/2024 11:50:22 PM COMPARISON: None available. CLINICAL HISTORY: Questionable sepsis - evaluate for abnormality. Questionable sepsis. Evaluate for abnormality. FINDINGS: LINES, TUBES AND DEVICES: Left dialysis catheter in similar position with tip likely at the confluence of the left brachiocephalic vein and superior vena cava. LUNGS AND PLEURA: Persistent trace bilateral pleural effusions. Persistent Left retrocardiac airspace opacity. Increased interstitial markings slightly improved. No pneumothorax. HEART AND MEDIASTINUM: No acute abnormality of the cardiac and mediastinal silhouettes. BONES AND SOFT TISSUES: Thoracolumbar surgical hardware. IMPRESSION: 1. Persistent left retrocardiac airspace opacity. 2. Persistent trace bilateral pleural effusions. 3. Increased interstitial markings, slightly improved. Electronically signed by: Morgane Naveau MD 10/19/2024 11:53 PM EST RP Workstation: [id number]     Medications:    sodium chloride      doxycycline  (VIBRAMYCIN ) IV Stopped (10/20/24 1341)   furosemide  Stopped (10/20/24 1133)   [START ON 10/21/2024] vancomycin       bisacodyl   10 mg Oral QHS   calcitRIOL   0.5 mcg Oral Daily   carvedilol   6.25 mg Oral BID WC   Chlorhexidine  Gluconate Cloth  6 each Topical Q0600   diltiazem   240 mg Oral QPM   doxepin   10 mg Oral QHS   famotidine   10 mg Oral Daily   gabapentin   300 mg Oral QHS   heparin   5,000 Units Subcutaneous Q12H   insulin  aspart  0-9 Units Subcutaneous TID WC   insulin  glargine-yfgn  20 Units Subcutaneous Daily   lactulose   20 g Oral BID   levothyroxine   175 mcg Oral QHS   linaclotide   145 mcg Oral QAC breakfast   loratadine   10 mg Oral Daily   melatonin  10 mg Oral QHS   [START  ON 10/21/2024] midodrine   20 mg Oral Once per day on Monday Wednesday Friday   midodrine   20 mg Oral Once   polyethylene glycol  17 g Oral BID   predniSONE   10 mg Oral Q breakfast   senna-docusate  1 tablet Oral BID   simethicone   80 mg Oral QID   sodium chloride  flush  3 mL Intravenous Q12H   sodium chloride , acetaminophen , bisacodyl , ipratropium-albuterol , metoCLOPramide  (REGLAN ) injection, ondansetron  (ZOFRAN ) IV, oxyCODONE , sodium chloride  flush  Assessment/ Plan:  Mr. Alexei Doswell is a 64 y.o.  male with past medical conditions including diabetes, hepatitis C, bilateral BKA, anxiety and depression, and end-stage renal disease on hemodialysis.  Patient presents to the emergency department from his facility complaining of shortness of breath.  Patient  is currently admitted for Acute respiratory failure with hypoxia (HCC) [J96.01] CHF (congestive heart failure) (HCC) [I50.9]  [facility]/MWF/120.5 kg  End-stage renal disease with hyperkalemia on hemodialysis.        Last treatment received on Friday.  Patient missed dialysis yesterday.  Was planning to perform dialysis today with fluid removal and potassium management. Potassium 5.9.  however patient adamantly refuses.  Will reassess patient's desire to proceed tomorrow. Will coordinate with pharmacy previously scheduled antibiotics with treatment.     Anemia of chronic kidney disease Lab Results  Component Value Date   HGB 9.1 (L) 10/20/2024   Hemoglobin 9.1, will order low-dose IV Retacrit  4000 units with dialysis.  3. Secondary Hyperparathyroidism: with outpatient labs: None available  Lab Results  Component Value Date   PTH 177 (H) 04/23/2024   CALCIUM  8.4 (L) 10/20/2024   PHOS 9.7 (H) 10/14/2024   Hyperphosphatemia noted, will consider phosphate binders as outpatient.     LOS: 0 Gionni Freese 1/27/20263:42 PM   "

## 2024-10-20 NOTE — TOC Initial Note (Signed)
 Transition of Care [facility]) - Initial/Assessment Note    Patient Details  Name: Eugene [name] MRN: [id number] Date of Birth: [birth year]  [facility]) CM/SW Contact:    Lauraine JAYSON Carpen, LCSW Phone Number: 10/20/2024, 10:16 AM  Clinical Narrative: CSW is familiar with patient from previous admissions. He is a long-term resident at [facility]. CSW will continue to follow patient for support and facilitate return to SNF once medically stable.                 Expected Discharge Plan: Skilled Nursing Facility Barriers to Discharge: Continued Medical Work up   Patient Goals and CMS Choice            Expected Discharge Plan and Services       Living arrangements for the past 2 months: Skilled Nursing Facility                                      Prior Living Arrangements/Services Living arrangements for the past 2 months: Skilled Nursing Facility Lives with:: Facility Resident Patient language and need for interpreter reviewed:: Yes        Need for Family Participation in Patient Care: Yes (Comment) Care giver support system in place?: Yes (comment)   Criminal Activity/Legal Involvement Pertinent to Current Situation/Hospitalization: No - Comment as needed  Activities of Daily Living      Permission Sought/Granted                  Emotional Assessment       Orientation: : Oriented to Self, Oriented to Place, Oriented to  Time, Oriented to Situation Alcohol / Substance Use: Not Applicable Psych Involvement: No (comment)  Admission diagnosis:  Acute respiratory failure with hypoxia (HCC) [J96.01] CHF (congestive heart failure) (HCC) [I50.9] Patient Active Problem List   Diagnosis Date Noted   CHF (congestive heart failure) (HCC) 10/20/2024   MRSA bacteremia 10/01/2024   SIRS (systemic inflammatory response syndrome) (HCC) 09/29/2024   Chronic respiratory failure with hypoxia (HCC) 09/29/2024   Chronic bilateral pleural  effusions 09/29/2024   Heart failure with preserved ejection fraction (HCC) 09/29/2024   Chronic constipation 09/29/2024   Obesity (BMI 30-39.9) 09/29/2024   ESRD needing dialysis (HCC) 08/03/2024   Severe sepsis with acute organ dysfunction (HCC) 06/22/2024   Obesity, Class III, BMI 40-49.9 (morbid obesity) (HCC) 06/22/2024   Elevated troponin 06/22/2024   Paroxysmal SVT (supraventricular tachycardia) 05/25/2024   ESRD (end stage renal disease) (HCC) 05/25/2024   Hypoxia 05/25/2024   Thrombocytopenia 05/24/2024   Class 3 obesity (HCC) 05/22/2024   Bronchospasm 05/22/2024   Anasarca associated with disorder of kidney 05/09/2024   Type 2 diabetes mellitus with peripheral neuropathy (HCC) 05/03/2024   Hypothyroidism 05/03/2024   IBS (irritable bowel syndrome) 05/03/2024   Acute on chronic diastolic CHF (congestive heart failure) (HCC) 05/03/2024   CKD (chronic kidney disease) stage 4, GFR 15-29 ml/min (HCC) 05/03/2024   Anaphylaxis 05/02/2024   Ileus (HCC) 04/23/2024   Acute respiratory failure with hypoxia (HCC) 04/19/2024   Acid-base disorder, mixed 04/16/2024   Acute kidney injury superimposed on CKD 04/15/2024   Diabetic gastroparesis (HCC) 04/17/2021   Suprapubic catheter dysfunction    Chronic pain    Constipation by delayed colonic transit    Nausea and vomiting 10/21/2019   Suprapubic catheter (HCC)    Sepsis (HCC) 10/02/2019   Thoracic spinal stenosis 02/12/2019  Osteomyelitis of thoracic spine (HCC) 02/08/2019   Peripheral vascular disease 02/08/2019   HTN (hypertension) 02/08/2019   Dyslipidemia 02/08/2019   Hyperlipidemia associated with type 2 diabetes mellitus (HCC) 02/08/2019   Polysubstance abuse (HCC) 02/08/2019   Chronic hepatitis C without hepatic coma (HCC) 02/08/2019   GERD (gastroesophageal reflux disease) 02/08/2019   Cigarette smoker 02/08/2019   Normocytic anemia 02/08/2019   Catheter-associated urinary tract infection 02/07/2019   AKI (acute kidney  injury) 02/07/2019   Hyperkalemia 02/07/2019   Pressure injury of skin 02/07/2019   Hyperlipidemia 11/07/2018   Drug-seeking behavior 11/07/2018   Abnormality of gait 10/17/2018   S/P bilateral BKA (below knee amputation) (HCC) 10/17/2018   Abdominal discomfort 03/24/2018   Acute pain of left knee 01/23/2018   MRSA (methicillin resistant Staphylococcus aureus) septicemia (HCC) 08/17/2017   Bacteremia due to Staphylococcus aureus 08/15/2017   Other acute osteomyelitis, left ankle and foot (HCC) 06/29/2017   Diabetic ulcer of right foot associated with diabetes mellitus due to underlying condition (HCC) 06/27/2017   Cellulitis of left foot 06/27/2017   Tobacco use disorder 06/27/2017   Uncontrolled type 2 diabetes mellitus with hyperglycemia, with long-term current use of insulin  (HCC) 06/18/2017   PCP:  [employer]:   [address] [address] [place], [place] - [address] [place]. [address] [place] [address] Phone: [phone] Fax: [phone]  [facility] [place], [place] - [address] [place] [address] Phone: [phone] Fax: [phone]     Social Drivers of Health (SDOH) Social History: SDOH Screenings   Food Insecurity: No Food Insecurity (09/29/2024)  Housing: Low Risk (09/29/2024)  Transportation Needs: No Transportation Needs (09/29/2024)  Utilities: Not At Risk (09/29/2024)  Social Connections: Socially Isolated (09/29/2024)  Tobacco Use: Medium Risk (10/20/2024)   SDOH Interventions: Social Connections Interventions: Intervention Not Indicated, Inpatient TOC   Readmission Risk Interventions    05/05/2024    3:05 PM  Readmission Risk Prevention Plan  Transportation Screening Complete  Medication Review (RN Care Manager) Complete  PCP or Specialist appointment within 3-5 days of discharge Complete  SW Recovery Care/Counseling Consult Complete  Palliative Care Screening Not  Applicable  Skilled Nursing Facility Complete

## 2024-10-20 NOTE — [place] FL2 (Signed)
 " [place]  MEDICAID FL2 LEVEL OF CARE FORM     IDENTIFICATION  Patient Name: Eugene [name] Birthdate: 19-Dec-1960 Sex: male Admission Date (Current Location): 10/19/2024  [facility] and [place] Number:  Chiropodist and Address:  [facility], [address] [place], [place] [address]      Provider Number: [id number]  Attending Physician Name and Address:  Laurita Cort DASEN, MD  Relative Name and Phone Number:       Current Level of Care: Hospital Recommended Level of Care: Skilled Nursing Facility Prior Approval Number:    Date Approved/Denied:   PASRR Number: [id number]  Discharge Plan: SNF    Current Diagnoses: Patient Active Problem List   Diagnosis Date Noted   CHF (congestive heart failure) (HCC) 10/20/2024   MRSA bacteremia 10/01/2024   SIRS (systemic inflammatory response syndrome) (HCC) 09/29/2024   Chronic respiratory failure with hypoxia (HCC) 09/29/2024   Chronic bilateral pleural effusions 09/29/2024   Heart failure with preserved ejection fraction (HCC) 09/29/2024   Chronic constipation 09/29/2024   Obesity (BMI 30-39.9) 09/29/2024   ESRD needing dialysis (HCC) 08/03/2024   Severe sepsis with acute organ dysfunction (HCC) 06/22/2024   Obesity, Class III, BMI 40-49.9 (morbid obesity) (HCC) 06/22/2024   Elevated troponin 06/22/2024   Paroxysmal SVT (supraventricular tachycardia) 05/25/2024   ESRD (end stage renal disease) (HCC) 05/25/2024   Hypoxia 05/25/2024   Thrombocytopenia 05/24/2024   Class 3 obesity (HCC) 05/22/2024   Bronchospasm 05/22/2024   Anasarca associated with disorder of kidney 05/09/2024   Type 2 diabetes mellitus with peripheral neuropathy (HCC) 05/03/2024   Hypothyroidism 05/03/2024   IBS (irritable bowel syndrome) 05/03/2024   Acute on chronic diastolic CHF (congestive heart failure) (HCC) 05/03/2024   CKD (chronic kidney disease) stage 4, GFR 15-29 ml/min (HCC) 05/03/2024   Anaphylaxis 05/02/2024    Ileus (HCC) 04/23/2024   Acute respiratory failure with hypoxia (HCC) 04/19/2024   Acid-base disorder, mixed 04/16/2024   Acute kidney injury superimposed on CKD 04/15/2024   Diabetic gastroparesis (HCC) 04/17/2021   Suprapubic catheter dysfunction    Chronic pain    Constipation by delayed colonic transit    Nausea and vomiting 10/21/2019   Suprapubic catheter (HCC)    Sepsis (HCC) 10/02/2019   Thoracic spinal stenosis 02/12/2019   Osteomyelitis of thoracic spine (HCC) 02/08/2019   Peripheral vascular disease 02/08/2019   HTN (hypertension) 02/08/2019   Dyslipidemia 02/08/2019   Hyperlipidemia associated with type 2 diabetes mellitus (HCC) 02/08/2019   Polysubstance abuse (HCC) 02/08/2019   Chronic hepatitis C without hepatic coma (HCC) 02/08/2019   GERD (gastroesophageal reflux disease) 02/08/2019   Cigarette smoker 02/08/2019   Normocytic anemia 02/08/2019   Catheter-associated urinary tract infection 02/07/2019   AKI (acute kidney injury) 02/07/2019   Hyperkalemia 02/07/2019   Pressure injury of skin 02/07/2019   Hyperlipidemia 11/07/2018   Drug-seeking behavior 11/07/2018   Abnormality of gait 10/17/2018   S/P bilateral BKA (below knee amputation) (HCC) 10/17/2018   Abdominal discomfort 03/24/2018   Acute pain of left knee 01/23/2018   MRSA (methicillin resistant Staphylococcus aureus) septicemia (HCC) 08/17/2017   Bacteremia due to Staphylococcus aureus 08/15/2017   Other acute osteomyelitis, left ankle and foot (HCC) 06/29/2017   Diabetic ulcer of right foot associated with diabetes mellitus due to underlying condition (HCC) 06/27/2017   Cellulitis of left foot 06/27/2017   Tobacco use disorder 06/27/2017   Uncontrolled type 2 diabetes mellitus with hyperglycemia, with long-term current use of insulin  (HCC) 06/18/2017  Orientation RESPIRATION BLADDER Height & Weight     Self, Time, Situation, Place  O2 (HFNC 10 L. Weaning as tolerated.) Incontinent, Indwelling  catheter Weight:   Height:     BEHAVIORAL SYMPTOMS/MOOD NEUROLOGICAL BOWEL NUTRITION STATUS   (None)  (None) Incontinent Diet (Renal/carb modified. Fluid restriction: 1200 mL.)  AMBULATORY STATUS COMMUNICATION OF NEEDS Skin     Verbally PU Stage and Appropriate Care   PU Stage 2 Dressing:  (Buttocks: Foam.)                   Personal Care Assistance Level of Assistance              Functional Limitations Info  Sight, Hearing, Speech Sight Info: Adequate Hearing Info: Adequate Speech Info: Adequate    SPECIAL CARE FACTORS FREQUENCY                       Contractures Contractures Info: Not present    Additional Factors Info  Code Status, Allergies, Isolation Precautions Code Status Info: DNR Allergies Info: NKDA     Isolation Precautions Info: Droplet precautions - Respiratory panel pending     Current Medications (10/20/2024):  This is the current hospital active medication list Current Facility-Administered Medications  Medication Dose Route Frequency Provider Last Rate Last Admin   0.9 %  sodium chloride  infusion  250 mL Intravenous PRN Laurita Cort DASEN, MD       acetaminophen  (TYLENOL ) tablet 650 mg  650 mg Oral Q6H PRN Laurita Cort DASEN, MD       bisacodyl  (DULCOLAX) EC tablet 10 mg  10 mg Oral QHS Laurita Cort DASEN, MD       bisacodyl  (DULCOLAX) suppository 10 mg  10 mg Rectal Daily PRN Laurita Cort T, MD       calcitRIOL  (ROCALTROL ) capsule 0.5 mcg  0.5 mcg Oral Daily Laurita Cort T, MD   0.5 mcg at 10/20/24 0941   carvedilol  (COREG ) tablet 6.25 mg  6.25 mg Oral BID WC Zhang, Ping T, MD   6.25 mg at 10/20/24 0940   Chlorhexidine  Gluconate Cloth 2 % PADS 6 each  6 each Topical Q0600 Lateef, Munsoor, MD       diltiazem  (CARDIZEM  CD) 24 hr capsule 240 mg  240 mg Oral QPM Laurita Cort T, MD       Doxepin  HCl TABS 6 mg  6 mg Oral QHS Laurita Cort T, MD       doxycycline  (VIBRAMYCIN ) 100 mg in sodium chloride  0.9 % 250 mL IVPB  100 mg Intravenous Q12H Laurita Cort T, MD        famotidine  (PEPCID ) tablet 10 mg  10 mg Oral Daily Zhang, Ping T, MD   10 mg at 10/20/24 0940   furosemide  (LASIX ) 100 mg in dextrose  5 % 50 mL IVPB  100 mg Intravenous BID Laurita Cort T, MD       gabapentin  (NEURONTIN ) capsule 300 mg  300 mg Oral QHS Laurita Cort T, MD       heparin  injection 5,000 Units  5,000 Units Subcutaneous Q12H Laurita Cort T, MD   5,000 Units at 10/20/24 9057   insulin  aspart (novoLOG ) injection 0-9 Units  0-9 Units Subcutaneous TID WC Laurita Cort DASEN, MD       insulin  glargine-yfgn injection 20 Units  20 Units Subcutaneous Daily Zhang, Ping T, MD       ipratropium-albuterol  (DUONEB) 0.5-2.5 (3) MG/3ML nebulizer solution 3 mL  3 mL Nebulization Q6H  PRN Laurita Cort DASEN, MD       lactulose  (CHRONULAC ) 10 GM/15ML solution 20 g  20 g Oral BID Laurita Cort T, MD   20 g at 10/20/24 9056   levothyroxine  (SYNTHROID ) tablet 175 mcg  175 mcg Oral QHS Laurita Cort DASEN, MD       linaclotide  (LINZESS ) capsule 145 mcg  145 mcg Oral QAC breakfast Laurita Cort T, MD   145 mcg at 10/20/24 9058   loratadine  (CLARITIN ) tablet 10 mg  10 mg Oral Daily Laurita Cort T, MD   10 mg at 10/20/24 0941   melatonin tablet 10 mg  10 mg Oral QHS Laurita Cort DASEN, MD       metoCLOPramide  (REGLAN ) injection 5 mg  5 mg Intravenous Q6H PRN Laurita Cort DASEN, MD       [START ON 10/21/2024] midodrine  (PROAMATINE ) tablet 20 mg  20 mg Oral Once per day on Monday Wednesday Friday Laurita Cort T, MD       midodrine  (PROAMATINE ) tablet 20 mg  20 mg Oral Once Laurita Cort T, MD       ondansetron  (ZOFRAN ) injection 4 mg  4 mg Intravenous Q6H PRN Laurita Cort DASEN, MD       oxyCODONE  (Oxy IR/ROXICODONE ) immediate release tablet 7.5 mg  7.5 mg Oral Q6H PRN Laurita Cort T, MD       polyethylene glycol (MIRALAX  / GLYCOLAX ) packet 17 g  17 g Oral BID Laurita Cort T, MD   17 g at 10/20/24 9056   predniSONE  (DELTASONE ) tablet 10 mg  10 mg Oral Q breakfast Laurita Cort T, MD   10 mg at 10/20/24 0941   senna-docusate (Senokot-S) tablet 1 tablet  1  tablet Oral BID Laurita Cort DASEN, MD   1 tablet at 10/20/24 0941   simethicone  (MYLICON) chewable tablet 80 mg  80 mg Oral QID Laurita Cort T, MD   80 mg at 10/20/24 9057   sodium chloride  flush (NS) 0.9 % injection 3 mL  3 mL Intravenous Q12H Laurita Cort T, MD       sodium chloride  flush (NS) 0.9 % injection 3 mL  3 mL Intravenous PRN Laurita Cort DASEN, MD       NOREEN ON 10/21/2024] vancomycin  IVPB  1,000 mg Intravenous Q M,W,F Laurita Cort DASEN, MD       Current Outpatient Medications  Medication Sig Dispense Refill   acetaminophen  (TYLENOL ) 325 MG tablet Take 650 mg by mouth in the morning and at bedtime.     bisacodyl  (DULCOLAX) 10 MG suppository Place 10 mg rectally daily as needed for moderate constipation.     bisacodyl  (DULCOLAX) 5 MG EC tablet Take 2 tablets (10 mg total) by mouth at bedtime. 30 tablet 0   calcitRIOL  (ROCALTROL ) 0.5 MCG capsule Take 1 capsule (0.5 mcg total) by mouth daily. 30 capsule 1   carvedilol  (COREG ) 6.25 MG tablet Take 1 tablet (6.25 mg total) by mouth 2 (two) times daily with a meal.     cetirizine (ZYRTEC) 10 MG tablet Take 10 mg by mouth daily.     cyanocobalamin  1000 MCG tablet Take 1 tablet (1,000 mcg total) by mouth daily.     diltiazem  (CARDIZEM  CD) 240 MG 24 hr capsule Take 1 capsule (240 mg total) by mouth every evening. (Patient taking differently: Take 240 mg by mouth every evening.)     Doxepin  HCl 6 MG TABS Take 6 mg by mouth at bedtime.     famotidine  (PEPCID ) 10  MG tablet Take 1 tablet (10 mg total) by mouth daily.     folic acid  (FOLVITE ) 1 MG tablet Take 1 tablet (1 mg total) by mouth daily.     furosemide  (LASIX ) 80 MG tablet Take 1 tablet (80 mg total) by mouth daily. (Patient taking differently: Take 80 mg by mouth daily. CHF)     gabapentin  (NEURONTIN ) 300 MG capsule Take 300 mg by mouth at bedtime.     guaifenesin  (ROBITUSSIN) 100 MG/5ML syrup Take 200 mg by mouth 3 (three) times daily as needed for cough.     insulin  glargine-yfgn (SEMGLEE ) 100  UNIT/ML injection Inject 0.2 mLs (20 Units total) into the skin 2 (two) times daily. 10 mL 0   insulin  lispro (HUMALOG ) 100 UNIT/ML injection Inject 0-15 Units into the skin 4 (four) times daily -  before meals and at bedtime. <150= 0 units 151-200= 3 units 201-250= 6 units 251-300= 9 units 301-350= 12 units 351-400= 15 units >400 call MD (Patient taking differently: Inject 0-15 Units into the skin 4 (four) times daily -  before meals and at bedtime. <150= 0 units 151-200= 3 units 201-250= 6 units 251-300= 9 units 301-350= 12 units 351-400= 15 units >400 call MD)     ipratropium-albuterol  (DUONEB) 0.5-2.5 (3) MG/3ML SOLN Take 3 mLs by nebulization every 6 (six) hours as needed.     Lactulose  20 GM/30ML SOLN Take 30 mLs (20 g total) by mouth 2 (two) times daily. 450 mL 0   levothyroxine  (SYNTHROID ) 175 MCG tablet Take 175 mcg by mouth at bedtime.     linaclotide  (LINZESS ) 145 MCG CAPS capsule Take 1 capsule (145 mcg total) by mouth daily before breakfast. 30 capsule 0   Melatonin 5 MG TABS Take 10 mg by mouth at bedtime.     metoCLOPramide  (REGLAN ) 10 MG tablet Take 0.5 tablets (5 mg total) by mouth every 8 (eight) hours as needed for nausea or vomiting.     midodrine  (PROAMATINE ) 5 MG tablet Take 20 mg by mouth. Monday, Wednesday and Friday     ondansetron  (ZOFRAN ) 4 MG tablet Take 4 mg by mouth every 8 (eight) hours as needed for nausea or vomiting.     oxyCODONE  (ROXICODONE ) 15 MG immediate release tablet Take 0.5 tablets (7.5 mg total) by mouth every 6 (six) hours as needed. 5 tablet 0   polyethylene glycol (MIRALAX  / GLYCOLAX ) 17 g packet Take 17 g by mouth 2 (two) times daily. 14 each 0   predniSONE  (DELTASONE ) 10 MG tablet Take 10 mg by mouth daily with breakfast.     senna-docusate (SENOKOT-S) 8.6-50 MG tablet Take 1 tablet by mouth 2 (two) times daily.     simethicone  (MYLICON) 80 MG chewable tablet Chew 1 tablet (80 mg total) by mouth 4 (four) times daily.     vancomycin  IVPB  Inject 1,000 mg into the vein every Monday, Wednesday, and Friday for 28 days. Vancomycin  1000mg  IV MWF with hemodialysis Indication:  MRSA bacteremia Last Day of Therapy:  11/11/2024 Labs - Monday:  CBC/D, CMP, and pre-HD vancomycin  level Fax weekly lab results  promptly to [phone] Call [phone] with questions or critical value To be given at HD center 13 Units    Vitamin D , Ergocalciferol , (DRISDOL ) 1.25 MG (50000 UNIT) CAPS capsule Take 1 capsule (50,000 Units total) by mouth every 7 (seven) days.     liver oil-zinc  oxide (DESITIN) 40 % ointment Apply 1 Application topically as needed for irritation. (Patient not taking: Reported on 10/20/2024)  Discharge Medications: Please see discharge summary for a list of discharge medications.  Relevant Imaging Results:  Relevant Lab Results:   Additional Information SS#: [id number]. HD at [facility] on MWF 11:45am.  Lauraine JAYSON Carpen, LCSW     "

## 2024-10-20 NOTE — H&P (Signed)
 " History and Physical    Eugene [name] FMW:[id number] DOB: 16-Feb-1961 DOA: 10/19/2024  PCP: [facility] (Confirm with patient/family/NH records and if not entered, this has to be entered at [facility] point of entry) Patient coming from: SNF  I have personally briefly reviewed patient's old medical records in [facility]  Chief Complaint: Cough, shortness of breath  HPI: Eugene [name] is a 64 y.o. male with medical history significant of recently diagnosed MRSA bacteremia on vancomycin , ESRD on HD MWF, chronic hypoxic respiratory failure on 6 L continuously, bilateral BKA, chronic HFpEF, IDDM, HTN, HLD<hypothyroidism, chronic pain syndrome, obesity, presented with worsening of cough and hypoxia.  Patient was recently hospitalized for sepsis when he was diagnosed with MRSA bacteremia, endocarditis workup negative.  Subsequently patient was discharged home on vancomycin  Monday Wednesday Friday.  Last 2 days patient developed a dry cough with increasing hypoxia, this morning patient was found to be hypoxic and was placed on 15 L.  Patient denies any chest pain, no nausea vomiting no diarrhea.  He still makes urine but denied any dysuria no back pains. ED Course: Temperature 100.9, blood pressure 140/80 O2 saturation 89% on 10 L and stabilized on NRB but eventually stabilized on 6 L.  Chest x-ray showed bilateral pulmonary congestion.  Blood work showed WBC 10.8 hemoglobin 9.1 BUN 48 creatinine 5.6 bicarb 25, VBG 7.30 2/56/61.  Patient was given vancomycin  and cefepime  in the ED.  Nephrology was consulted for emergency HD.  Review of Systems: As per HPI otherwise 14 point review of systems negative.    Past Medical History:  Diagnosis Date   Anxiety    Depression    Diabetes mellitus without complication (HCC)    End stage renal disease (HCC)    Hepatitis C    Hyperlipidemia    IBS (irritable bowel syndrome)    Osteomyelitis (HCC)    Spinal stenosis     Past Surgical  History:  Procedure Laterality Date   BACK SURGERY     bilateral amputation Bilateral    BKA Bilateral    CENTRAL LINE INSERTION-TUNNELED N/A 02/02/2019   Procedure: CENTRAL LINE INSERTION-TUNNELED;  Surgeon: Marea Selinda RAMAN, MD;  Location: [facility] INVASIVE CV LAB;  Service: Cardiovascular;  Laterality: N/A;   COLONOSCOPY WITH PROPOFOL  N/A 01/12/2020   Procedure: COLONOSCOPY WITH PROPOFOL ;  Surgeon: Jinny Carmine, MD;  Location: [facility] ENDOSCOPY;  Service: Endoscopy;  Laterality: N/A;   DIALYSIS/PERMA CATHETER INSERTION N/A 05/22/2024   Procedure: DIALYSIS/PERMA CATHETER INSERTION;  Surgeon: Jama Cordella MATSU, MD;  Location: [facility] INVASIVE CV LAB;  Service: Cardiovascular;  Laterality: N/A;   DIALYSIS/PERMA CATHETER INSERTION N/A 07/21/2024   Procedure: DIALYSIS/PERMA CATHETER INSERTION;  Surgeon: Marea Selinda RAMAN, MD;  Location: [facility] INVASIVE CV LAB;  Service: Cardiovascular;  Laterality: N/A;   DIALYSIS/PERMA CATHETER INSERTION N/A 10/05/2024   Procedure: DIALYSIS/PERMA CATHETER INSERTION;  Surgeon: Marea Selinda RAMAN, MD;  Location: [facility] INVASIVE CV LAB;  Service: Cardiovascular;  Laterality: N/A;   DIALYSIS/PERMA CATHETER REMOVAL N/A 10/01/2024   Procedure: DIALYSIS/PERMA CATHETER REMOVAL;  Surgeon: Marea Selinda RAMAN, MD;  Location: [facility] INVASIVE CV LAB;  Service: Cardiovascular;  Laterality: N/A;   ESOPHAGOGASTRODUODENOSCOPY (EGD) WITH PROPOFOL  N/A 12/07/2019   Procedure: ESOPHAGOGASTRODUODENOSCOPY (EGD) WITH PROPOFOL ;  Surgeon: Unk Corinn Skiff, MD;  Location: [facility] ENDOSCOPY;  Service: Gastroenterology;  Laterality: N/A;   IR CATHETER TUBE CHANGE  12/04/2019   SPINAL FUSION     THORACIC SPINE SURGERY  01/2019   extensive washout     reports that he  has quit smoking. His smoking use included cigarettes. He has never used smokeless tobacco. He reports that he does not currently use alcohol. He reports that he does not currently use drugs.  Allergies[1]  Family History  Problem Relation Age of Onset    Kidney failure Father    Kidney failure Brother      Prior to Admission medications  Medication Sig Start Date End Date Taking? Authorizing Provider  acetaminophen  (TYLENOL ) 325 MG tablet Take 650 mg by mouth in the morning and at bedtime.   Yes [provider]  bisacodyl  (DULCOLAX) 10 MG suppository Place 10 mg rectally daily as needed for moderate constipation.   Yes [provider]  bisacodyl  (DULCOLAX) 5 MG EC tablet Take 2 tablets (10 mg total) by mouth at bedtime. 10/14/24  Yes Patel, Sona, MD  calcitRIOL  (ROCALTROL ) 0.5 MCG capsule Take 1 capsule (0.5 mcg total) by mouth daily. 10/14/24  Yes Patel, Sona, MD  carvedilol  (COREG ) 6.25 MG tablet Take 1 tablet (6.25 mg total) by mouth 2 (two) times daily with a meal. 05/28/24  Yes Amin, Sumayya, MD  cetirizine (ZYRTEC) 10 MG tablet Take 10 mg by mouth daily.   Yes [provider]  cyanocobalamin  1000 MCG tablet Take 1 tablet (1,000 mcg total) by mouth daily. 05/17/24  Yes Fausto Sor A, DO  diltiazem  (CARDIZEM  CD) 240 MG 24 hr capsule Take 1 capsule (240 mg total) by mouth every evening. Patient taking differently: Take 240 mg by mouth every evening. 05/28/24  Yes Caleen Qualia, MD  Doxepin  HCl 6 MG TABS Take 6 mg by mouth at bedtime.   Yes [provider]  famotidine  (PEPCID ) 10 MG tablet Take 1 tablet (10 mg total) by mouth daily. 05/17/24  Yes Fausto Sor A, DO  folic acid  (FOLVITE ) 1 MG tablet Take 1 tablet (1 mg total) by mouth daily. 05/17/24  Yes Fausto Sor A, DO  furosemide  (LASIX ) 80 MG tablet Take 1 tablet (80 mg total) by mouth daily. Patient taking differently: Take 80 mg by mouth daily. CHF 05/28/24  Yes Caleen Qualia, MD  gabapentin  (NEURONTIN ) 300 MG capsule Take 300 mg by mouth at bedtime.   Yes [provider]  guaifenesin  (ROBITUSSIN) 100 MG/5ML syrup Take 200 mg by mouth 3 (three) times daily as needed for cough.   Yes [provider]  insulin  glargine-yfgn (SEMGLEE )  100 UNIT/ML injection Inject 0.2 mLs (20 Units total) into the skin 2 (two) times daily. 10/14/24  Yes Patel, Sona, MD  insulin  lispro (HUMALOG ) 100 UNIT/ML injection Inject 0-15 Units into the skin 4 (four) times daily -  before meals and at bedtime. <150= 0 units 151-200= 3 units 201-250= 6 units 251-300= 9 units 301-350= 12 units 351-400= 15 units >400 call MD Patient taking differently: Inject 0-15 Units into the skin 4 (four) times daily -  before meals and at bedtime. <150= 0 units 151-200= 3 units 201-250= 6 units 251-300= 9 units 301-350= 12 units 351-400= 15 units >400 call MD   Yes [provider]  ipratropium-albuterol  (DUONEB) 0.5-2.5 (3) MG/3ML SOLN Take 3 mLs by nebulization every 6 (six) hours as needed.   Yes [provider]  Lactulose  20 GM/30ML SOLN Take 30 mLs (20 g total) by mouth 2 (two) times daily. 10/14/24  Yes Patel, Sona, MD  levothyroxine  (SYNTHROID ) 175 MCG tablet Take 175 mcg by mouth at bedtime.   Yes [provider]  linaclotide  (LINZESS ) 145 MCG CAPS capsule Take 1 capsule (145 mcg  total) by mouth daily before breakfast. 10/25/19  Yes Danford, Lonni SQUIBB, MD  Melatonin 5 MG TABS Take 10 mg by mouth at bedtime.   Yes [provider]  metoCLOPramide  (REGLAN ) 10 MG tablet Take 0.5 tablets (5 mg total) by mouth every 8 (eight) hours as needed for nausea or vomiting. 05/28/24  Yes Caleen Qualia, MD  midodrine  (PROAMATINE ) 5 MG tablet Take 20 mg by mouth. Monday, Wednesday and Friday   Yes [provider]  ondansetron  (ZOFRAN ) 4 MG tablet Take 4 mg by mouth every 8 (eight) hours as needed for nausea or vomiting.   Yes [provider]  oxyCODONE  (ROXICODONE ) 15 MG immediate release tablet Take 0.5 tablets (7.5 mg total) by mouth every 6 (six) hours as needed. 10/14/24  Yes Patel, Sona, MD  polyethylene glycol (MIRALAX  / GLYCOLAX ) 17 g packet Take 17 g by mouth 2 (two) times daily. 10/24/19  Yes Danford, Lonni SQUIBB,  MD  predniSONE  (DELTASONE ) 10 MG tablet Take 10 mg by mouth daily with breakfast.   Yes [provider]  senna-docusate (SENOKOT-S) 8.6-50 MG tablet Take 1 tablet by mouth 2 (two) times daily. 10/24/19  Yes Danford, Lonni SQUIBB, MD  simethicone  (MYLICON) 80 MG chewable tablet Chew 1 tablet (80 mg total) by mouth 4 (four) times daily. 05/01/24  Yes Jhonny Sahara B, MD  vancomycin  IVPB Inject 1,000 mg into the vein every Monday, Wednesday, and Friday for 28 days. Vancomycin  1000mg  IV MWF with hemodialysis Indication:  MRSA bacteremia Last Day of Therapy:  11/11/2024 Labs - Monday:  CBC/D, CMP, and pre-HD vancomycin  level Fax weekly lab results  promptly to [phone] Call [phone] with questions or critical value To be given at HD center 10/14/24 11/11/24 Yes Tobie Calix, MD  Vitamin D , Ergocalciferol , (DRISDOL ) 1.25 MG (50000 UNIT) CAPS capsule Take 1 capsule (50,000 Units total) by mouth every 7 (seven) days. 05/18/24  Yes Fausto Sor A, DO  liver oil-zinc  oxide (DESITIN) 40 % ointment Apply 1 Application topically as needed for irritation. Patient not taking: Reported on 10/20/2024    [provider]  pregabalin  (LYRICA ) 75 MG capsule Take 75 mg by mouth 2 (two) times daily. Patient not taking: Reported on 04/17/2021  04/20/21  [provider]    Physical Exam: Vitals:   10/20/24 0415 10/20/24 0500 10/20/24 0530 10/20/24 0630  BP:  (!) 110/58 105/63 (!) 106/50  Pulse:  67 68 65  Resp:    14  Temp:      TempSrc:      SpO2: 96% 97% 96% 96%    Constitutional: NAD, calm, comfortable Vitals:   10/20/24 0415 10/20/24 0500 10/20/24 0530 10/20/24 0630  BP:  (!) 110/58 105/63 (!) 106/50  Pulse:  67 68 65  Resp:    14  Temp:      TempSrc:      SpO2: 96% 97% 96% 96%   Eyes: PERRL, lids and conjunctivae normal ENMT: Mucous membranes are moist. Posterior pharynx clear of any exudate or lesions.Normal dentition.  Neck: normal, supple, no masses, no  thyromegaly Respiratory: clear to auscultation bilaterally, no wheezing, bilateral crackles, increasing respiratory effort. No accessory muscle use.  Cardiovascular: Regular rate and rhythm, no murmurs / rubs / gallops. No extremity edema. 2+ pedal pulses. No carotid bruits.  Abdomen: no tenderness, no masses palpated. No hepatosplenomegaly. Bowel sounds positive.  Musculoskeletal: no clubbing / cyanosis. No joint deformity upper and lower extremities. Good ROM, no contractures. Normal muscle tone.  Skin: no  rashes, lesions, ulcers. No induration Neurologic: CN 2-12 grossly intact. Sensation intact, DTR normal. Strength 5/5 in all 4.  Psychiatric: Normal judgment and insight. Alert and oriented x 3. Normal mood.    Labs on Admission: I have personally reviewed following labs and imaging studies  CBC: Recent Labs  Lab 10/14/24 1420 10/20/24 0025  WBC 6.8 10.8*  NEUTROABS  --  6.9  HGB 7.9* 9.1*  HCT 24.4* 29.3*  MCV 92.4 94.2  PLT 205 257   Basic Metabolic Panel: Recent Labs  Lab 10/14/24 1420 10/20/24 0025  NA 128* 132*  K 4.8 5.9*  CL 93* 94*  CO2 24 25  GLUCOSE 144* 232*  BUN 57* 48*  CREATININE 5.08* 5.69*  CALCIUM  7.9* 8.4*  PHOS 9.7*  --    GFR: Estimated Creatinine Clearance: 18 mL/min (A) (by C-G formula based on SCr of 5.69 mg/dL (H)). Liver Function Tests: Recent Labs  Lab 10/14/24 1420 10/20/24 0025  AST  --  27  ALT  --  17  ALKPHOS  --  81  BILITOT  --  0.4  PROT  --  8.1  ALBUMIN  2.7* 3.2*   No results for input(s): LIPASE, AMYLASE in the last 168 hours. No results for input(s): AMMONIA in the last 168 hours. Coagulation Profile: Recent Labs  Lab 10/20/24 0025  INR 0.9   Cardiac Enzymes: No results for input(s): CKTOTAL, CKMB, CKMBINDEX, TROPONINI in the last 168 hours. BNP (last 3 results) Recent Labs    10/20/24 0025  PROBNP 2,355.0*   HbA1C: No results for input(s): HGBA1C in the last 72 hours. CBG: Recent Labs   Lab 10/13/24 1543 10/13/24 2126 10/14/24 0829 10/14/24 2146 10/15/24 0734  GLUCAP 186* 133* 231* 181* 108*   Lipid Profile: No results for input(s): CHOL, HDL, LDLCALC, TRIG, CHOLHDL, LDLDIRECT in the last 72 hours. Thyroid Function Tests: No results for input(s): TSH, T4TOTAL, FREET4, T3FREE, THYROIDAB in the last 72 hours. Anemia Panel: No results for input(s): VITAMINB12, FOLATE, FERRITIN, TIBC, IRON , RETICCTPCT in the last 72 hours. Urine analysis:    Component Value Date/Time   COLORURINE YELLOW (A) 10/20/2024 0058   APPEARANCEUR TURBID (A) 10/20/2024 0058   LABSPEC 1.024 10/20/2024 0058   PHURINE 7.0 10/20/2024 0058   GLUCOSEU NEGATIVE 10/20/2024 0058   HGBUR SMALL (A) 10/20/2024 0058   BILIRUBINUR NEGATIVE 10/20/2024 0058   KETONESUR NEGATIVE 10/20/2024 0058   PROTEINUR >=300 (A) 10/20/2024 0058   NITRITE NEGATIVE 10/20/2024 0058   LEUKOCYTESUR LARGE (A) 10/20/2024 0058    Radiological Exams on Admission: DG Chest Port 1 View Result Date: 10/19/2024 EXAM: 1 VIEW(S) XRAY OF THE CHEST 10/19/2024 11:50:22 PM COMPARISON: None available. CLINICAL HISTORY: Questionable sepsis - evaluate for abnormality. Questionable sepsis. Evaluate for abnormality. FINDINGS: LINES, TUBES AND DEVICES: Left dialysis catheter in similar position with tip likely at the confluence of the left brachiocephalic vein and superior vena cava. LUNGS AND PLEURA: Persistent trace bilateral pleural effusions. Persistent Left retrocardiac airspace opacity. Increased interstitial markings slightly improved. No pneumothorax. HEART AND MEDIASTINUM: No acute abnormality of the cardiac and mediastinal silhouettes. BONES AND SOFT TISSUES: Thoracolumbar surgical hardware. IMPRESSION: 1. Persistent left retrocardiac airspace opacity. 2. Persistent trace bilateral pleural effusions. 3. Increased interstitial markings, slightly improved. Electronically signed by: Morgane Naveau MD  10/19/2024 11:53 PM EST RP Workstation: [id number]    EKG: Independently reviewed.  Sinus rhythm, no tented T waves found.  Assessment/Plan Principal Problem:   Acute respiratory failure with hypoxia (HCC) Active Problems:  CHF (congestive heart failure) (HCC)  (please populate well all problems here in Problem List. (For example, if patient is on BP meds at home and you resume or decide to hold them, it is a problem that needs to be her. Same for CAD, COPD, HLD and so on)  Acute on chronic hypoxic respiratory failure Acute on chronic HFpEF decompensation -Stabilized - Secondary to fluid overload - Emergency HD this morning - Other DDx, patient has a concurrent low-grade fever, chest x-ray showed bilateral infiltrates most compatible with pulmonary congestion.  Atypical pneumonia cannot be ruled out, will check atypical pneumonia study Legionella and mycoplasma.  For now we will cover him with doxycycline .  Will also check respiratory viral panel  Hyperkalemia - Urgency dialysis  Recently diagnosed MRSA bacteremia - Continue vancomycin  Monday Wednesday Friday, last dose will be November 11, 2024.  IDDM with hyperglycemia - Continue Lantus  and SSI  ESRD on HD - As above  Paroxysmal SVT - In sinus rhythm, continue Cardizem  and Coreg   COPD - No signs of acute COPD exacerbation - Continue as needed breathing meds  Chronic pain syndrome - As needed oxycodone   Obesity class II - BMI= 39 - Calorie control recommended  DVT prophylaxis: Heparin  subcu Code Status: DNR Family Communication: None at bedside Disposition Plan: Patient sick with hyperkalemia and fluid overload requiring emergency dialysis, probably complicated with atypical lung infection versus viral syndrome, expect more than 2 midnight hospital stay Consults called: Nephrology Admission status: Telemetry admission   Cort ONEIDA Mana MD [facility] Pager [phone]  10/20/2024, 8:50 AM       [1] No Known  Allergies  "

## 2024-10-20 NOTE — Progress Notes (Signed)
 Pt receives outpt HD at [facility] on MWF 11:45am . Navigator following to assist with any HD needs.  Suzen Satchel Dialysis Navigator [phone]

## 2024-10-20 NOTE — Progress Notes (Signed)
 Heart Failure Navigator Progress Note  Assessed for Heart & Vascular TOC clinic readiness.  Patient does not meet criteria due to current Hemodialysis patient @ [facility] on MWF schedule.  Navigator will sign off at this time.  Charmaine Pines, RN, BSN [facility] Heart Failure Navigator Secure Chat Only

## 2024-10-20 NOTE — Progress Notes (Signed)
 Patient is refusing dialysis today per Olam Norman, RN. FNP and Dr. Marcelino made aware.

## 2024-10-20 NOTE — Progress Notes (Signed)
 CODE SEPSIS - PHARMACY COMMUNICATION  **Broad Spectrum Antibiotics should be administered within 1 hour of Sepsis diagnosis**  Time Code Sepsis Called/Page Received: 1/26 @ 2346   Antibiotics Ordered: Cefepime  , Vancomycin    Time of 1st antibiotic administration: Cefepime  1 gm IV X 1 on 1/27 @ 0055   Additional action taken by pharmacy:   If necessary, Name of Provider/Nurse Contacted:     Royelle Hinchman D ,PharmD Clinical Pharmacist  10/20/2024  1:38 AM

## 2024-10-21 ENCOUNTER — Inpatient Hospital Stay

## 2024-10-21 DIAGNOSIS — J9601 Acute respiratory failure with hypoxia: Secondary | ICD-10-CM | POA: Diagnosis not present

## 2024-10-21 LAB — CBC
HCT: 25.7 % — ABNORMAL LOW (ref 39.0–52.0)
Hemoglobin: 7.9 g/dL — ABNORMAL LOW (ref 13.0–17.0)
MCH: 29.2 pg (ref 26.0–34.0)
MCHC: 30.7 g/dL (ref 30.0–36.0)
MCV: 94.8 fL (ref 80.0–100.0)
Platelets: 193 10*3/uL (ref 150–400)
RBC: 2.71 MIL/uL — ABNORMAL LOW (ref 4.22–5.81)
RDW: 14.6 % (ref 11.5–15.5)
WBC: 6.8 10*3/uL (ref 4.0–10.5)
nRBC: 0 % (ref 0.0–0.2)

## 2024-10-21 LAB — BASIC METABOLIC PANEL WITH GFR
Anion gap: 12 (ref 5–15)
BUN: 57 mg/dL — ABNORMAL HIGH (ref 8–23)
CO2: 26 mmol/L (ref 22–32)
Calcium: 7.8 mg/dL — ABNORMAL LOW (ref 8.9–10.3)
Chloride: 94 mmol/L — ABNORMAL LOW (ref 98–111)
Creatinine, Ser: 5.87 mg/dL — ABNORMAL HIGH (ref 0.61–1.24)
GFR, Estimated: 10 mL/min — ABNORMAL LOW
Glucose, Bld: 312 mg/dL — ABNORMAL HIGH (ref 70–99)
Potassium: 5.1 mmol/L (ref 3.5–5.1)
Sodium: 131 mmol/L — ABNORMAL LOW (ref 135–145)

## 2024-10-21 LAB — PROCALCITONIN: Procalcitonin: 0.47 ng/mL

## 2024-10-21 LAB — RESPIRATORY PANEL BY PCR

## 2024-10-21 LAB — GLUCOSE, CAPILLARY
Glucose-Capillary: 274 mg/dL — ABNORMAL HIGH (ref 70–99)
Glucose-Capillary: 299 mg/dL — ABNORMAL HIGH (ref 70–99)
Glucose-Capillary: 333 mg/dL — ABNORMAL HIGH (ref 70–99)
Glucose-Capillary: 327 mg/dL — ABNORMAL HIGH (ref 70–99)

## 2024-10-21 LAB — VANCOMYCIN, RANDOM: Vancomycin Rm: 25 ug/mL

## 2024-10-21 LAB — PRO BRAIN NATRIURETIC PEPTIDE: Pro Brain Natriuretic Peptide: 2968 pg/mL — ABNORMAL HIGH

## 2024-10-21 MED ORDER — EPOETIN ALFA-EPBX 4000 UNIT/ML IJ SOLN
4000.0000 [IU] | INTRAMUSCULAR | Status: DC
  Administered 2024-10-21 – 2024-10-28 (×4): 4000 [IU] via INTRAVENOUS
  Filled 2024-10-21 (×5): qty 1

## 2024-10-21 MED ORDER — PREDNISONE 10 MG PO TABS
5.0000 mg | ORAL_TABLET | Freq: Every day | ORAL | Status: DC
  Administered 2024-10-22 – 2024-10-25 (×4): 5 mg via ORAL
  Filled 2024-10-21 (×4): qty 1

## 2024-10-21 MED ORDER — MINERAL OIL RE ENEM: 1.0000 | ENEMA | Freq: Every evening | RECTAL | Status: DC

## 2024-10-21 MED ORDER — CALCIUM ACETATE (PHOS BINDER) 667 MG PO CAPS
667.0000 mg | ORAL_CAPSULE | Freq: Three times a day (TID) | ORAL | Status: DC
  Administered 2024-10-21 – 2024-10-23 (×4): 667 mg via ORAL
  Filled 2024-10-21 (×4): qty 1

## 2024-10-21 MED ORDER — ORAL CARE MOUTH RINSE: 15.0000 mL | OROMUCOSAL | Status: AC | PRN

## 2024-10-21 MED ORDER — BISACODYL 10 MG RE SUPP
10.0000 mg | Freq: Every day | RECTAL | Status: AC
  Administered 2024-10-21 – 2024-10-22 (×2): 10 mg via RECTAL
  Filled 2024-10-21 (×3): qty 1

## 2024-10-21 MED ORDER — OXYCODONE HCL 5 MG PO TABS
5.0000 mg | ORAL_TABLET | Freq: Four times a day (QID) | ORAL | Status: AC | PRN
  Administered 2024-10-21 – 2024-10-29 (×16): 5 mg via ORAL
  Filled 2024-10-21 (×15): qty 1

## 2024-10-21 MED ORDER — SODIUM CHLORIDE 0.9 % IV SOLN
1.0000 g | INTRAVENOUS | Status: DC
  Administered 2024-10-21: 1 g via INTRAVENOUS
  Filled 2024-10-21: qty 10

## 2024-10-21 MED ORDER — SODIUM CHLORIDE 0.9 % IV SOLN
1.0000 g | Freq: Once | INTRAVENOUS | Status: AC
  Administered 2024-10-21: 1 g via INTRAVENOUS
  Filled 2024-10-21: qty 10

## 2024-10-21 MED ORDER — VANCOMYCIN HCL IN DEXTROSE 1-5 GM/200ML-% IV SOLN
1000.0000 mg | INTRAVENOUS | Status: DC
  Filled 2024-10-21: qty 200

## 2024-10-21 MED ORDER — MINERAL OIL RE ENEM
1.0000 | ENEMA | Freq: Every evening | RECTAL | Status: DC
  Administered 2024-10-22: 1 via RECTAL

## 2024-10-21 NOTE — Progress Notes (Signed)
 " [facility]  ROUNDING NOTE   Subjective:   Eugene [name] is a 64 year old male with past medical conditions including diabetes, hepatitis C, bilateral BKA, anxiety and depression, and end-stage renal disease on hemodialysis.  Patient presents to the emergency department from his facility complaining of shortness of breath.  Patient is currently admitted for CHF (congestive heart failure) (HCC) [I50.9] Acute respiratory failure with hypoxia (HCC) [J96.01] MRSA bacteremia [R78.81, B95.62] Hypervolemia, unspecified hypervolemia type [E87.70] Sepsis, due to unspecified organism, unspecified whether acute organ dysfunction present [facility]) [A41.9]  Patient is known to our practice and receives outpatient dialysis treatments at [facility] on a MWF schedule, supervised by Dr. Dennise.    Patient seen sitting up in bed Completed breakfast tray at bedside Remains on 10L HFNC Agreeable to dialysis today   Objective:  Vital signs in last 24 hours:  Temp:  [97.9 F (36.6 C)-99.5 F (37.5 C)] 98 F (36.7 C) (01/28 1157) Pulse Rate:  [67-87] 67 (01/28 1157) Resp:  [16-20] 18 (01/28 1157) BP: (113-150)/(64-81) 135/71 (01/28 1157) SpO2:  [92 %-100 %] 94 % (01/28 1157) Weight:  [124.5 kg] 124.5 kg (01/28 0500)  Weight change:  Filed Weights   10/21/24 0500  Weight: 124.5 kg     Intake/Output: I/O last 3 completed shifts: In: 1687.8 [P.O.:1080; IV Piggyback:607.8] Out: -    Intake/Output this shift:  Total I/O In: 240 [P.O.:240] Out: 1100 [Urine:1100]  Physical Exam: General: NAD  Head: Normocephalic  Eyes: Anicteric  Lungs:  Coarse, 10 L HFNC  Heart: Regular rate and rhythm  Abdomen:  Soft, nontender  Extremities: No edema.  Bilateral BKA  Neurologic: Awake, alert, conversant  Skin: Warm,dry, no rash  Access: Lt internal jugular permcath    Basic Metabolic Panel: Recent Labs  Lab 10/14/24 1420 10/20/24 0025 10/20/24 2054 10/21/24 0450  NA 128* 132*  130* 131*  K 4.8 5.9* 5.6* 5.1  CL 93* 94* 94* 94*  CO2 24 25 25 26   GLUCOSE 144* 232* 350* 312*  BUN 57* 48* 55* 57*  CREATININE 5.08* 5.69* 5.84* 5.87*  CALCIUM  7.9* 8.4* 7.8* 7.8*  PHOS 9.7*  --   --   --     Liver Function Tests: Recent Labs  Lab 10/14/24 1420 10/20/24 0025  AST  --  27  ALT  --  17  ALKPHOS  --  81  BILITOT  --  0.4  PROT  --  8.1  ALBUMIN  2.7* 3.2*   No results for input(s): LIPASE, AMYLASE in the last 168 hours.  No results for input(s): AMMONIA in the last 168 hours.  CBC: Recent Labs  Lab 10/14/24 1420 10/20/24 0025 10/21/24 0450  WBC 6.8 10.8* 6.8  NEUTROABS  --  6.9  --   HGB 7.9* 9.1* 7.9*  HCT 24.4* 29.3* 25.7*  MCV 92.4 94.2 94.8  PLT 205 257 193    Cardiac Enzymes: No results for input(s): CKTOTAL, CKMB, CKMBINDEX, TROPONINI in the last 168 hours.  BNP: Invalid input(s): POCBNP  CBG: Recent Labs  Lab 10/20/24 1237 10/20/24 1741 10/20/24 2040 10/21/24 0740 10/21/24 1155  GLUCAP 249* 298* 322* 274* 299*    Microbiology: Results for orders placed or performed during the hospital encounter of 10/19/24  Blood Culture (routine x 2)     Status: None (Preliminary result)   Collection Time: 10/19/24 11:53 PM   Specimen: BLOOD  Result Value Ref Range Status   Specimen Description BLOOD RIGHT UPPER ARM  Final   Special  Requests   Final    BOTTLES DRAWN AEROBIC AND ANAEROBIC Blood Culture results may not be optimal due to an inadequate volume of blood received in culture bottles   Culture   Final    NO GROWTH < 12 HOURS Performed at [facility], [address], [place], [place] [address]    Report Status PENDING  Incomplete  Resp panel by RT-PCR (RSV, Flu A&B, Covid) Anterior Nasal Swab     Status: None   Collection Time: 10/19/24 11:53 PM   Specimen: Anterior Nasal Swab  Result Value Ref Range Status   SARS Coronavirus 2 by RT PCR NEGATIVE NEGATIVE Final    Comment: (NOTE) SARS-CoV-2 target  nucleic acids are NOT DETECTED.  The SARS-CoV-2 RNA is generally detectable in upper respiratory specimens during the acute phase of infection. The lowest concentration of SARS-CoV-2 viral copies this assay can detect is 138 copies/mL. A negative result does not preclude SARS-Cov-2 infection and should not be used as the sole basis for treatment or other patient management decisions. A negative result may occur with  improper specimen collection/handling, submission of specimen other than nasopharyngeal swab, presence of viral mutation(s) within the areas targeted by this assay, and inadequate number of viral copies(<138 copies/mL). A negative result must be combined with clinical observations, patient history, and epidemiological information. The expected result is Negative.  Fact Sheet for Patients:  bloggercourse.com  Fact Sheet for Healthcare Providers:  seriousbroker.it  This test is no t yet approved or cleared by the [place]  FDA and  has been authorized for detection and/or diagnosis of SARS-CoV-2 by FDA under an Emergency Use Authorization (EUA). This EUA will remain  in effect (meaning this test can be used) for the duration of the COVID-19 declaration under Section 564(b)(1) of the Act, 21 U.S.C.section 360bbb-3(b)(1), unless the authorization is terminated  or revoked sooner.       Influenza A by PCR NEGATIVE NEGATIVE Final   Influenza B by PCR NEGATIVE NEGATIVE Final    Comment: (NOTE) The Xpert Xpress SARS-CoV-2/FLU/RSV plus assay is intended as an aid in the diagnosis of influenza from Nasopharyngeal swab specimens and should not be used as a sole basis for treatment. Nasal washings and aspirates are unacceptable for Xpert Xpress SARS-CoV-2/FLU/RSV testing.  Fact Sheet for Patients: bloggercourse.com  Fact Sheet for Healthcare  Providers: seriousbroker.it  This test is not yet approved or cleared by the [place]  FDA and has been authorized for detection and/or diagnosis of SARS-CoV-2 by FDA under an Emergency Use Authorization (EUA). This EUA will remain in effect (meaning this test can be used) for the duration of the COVID-19 declaration under Section 564(b)(1) of the Act, 21 U.S.C. section 360bbb-3(b)(1), unless the authorization is terminated or revoked.     Resp Syncytial Virus by PCR NEGATIVE NEGATIVE Final    Comment: (NOTE) Fact Sheet for Patients: bloggercourse.com  Fact Sheet for Healthcare Providers: seriousbroker.it  This test is not yet approved or cleared by the [place]  FDA and has been authorized for detection and/or diagnosis of SARS-CoV-2 by FDA under an Emergency Use Authorization (EUA). This EUA will remain in effect (meaning this test can be used) for the duration of the COVID-19 declaration under Section 564(b)(1) of the Act, 21 U.S.C. section 360bbb-3(b)(1), unless the authorization is terminated or revoked.  Performed at [facility], [address] [place], [place] [address]   Blood Culture (routine x 2)     Status: None (Preliminary result)   Collection  Time: 10/20/24 12:26 AM   Specimen: BLOOD  Result Value Ref Range Status   Specimen Description BLOOD LEFT UPPER ARM  Final   Special Requests   Final    BOTTLES DRAWN AEROBIC AND ANAEROBIC Blood Culture results may not be optimal due to an inadequate volume of blood received in culture bottles   Culture   Final    NO GROWTH < 12 HOURS Performed at [facility], [address], [place], [place] [address]    Report Status PENDING  Incomplete  Urine Culture     Status: Abnormal (Preliminary result)   Collection Time: 10/20/24 12:58 AM   Specimen: Urine, Random  Result Value Ref Range Status   Specimen Description    Final    URINE, RANDOM Performed at [facility], [address] [place], [place] [address]    Special Requests   Final    NONE Reflexed from [id number] Performed at [facility], [address] [place], [place] [address]    Culture (A)  Final    >=100,000 COLONIES/mL PROTEUS MIRABILIS >=100,000 COLONIES/mL ENTEROCOCCUS FAECALIS SUSCEPTIBILITIES TO FOLLOW Performed at [facility], [address], [place], [place] [address]    Report Status PENDING  Incomplete    Coagulation Studies: Recent Labs    10/20/24 0025  LABPROT 13.2  INR 0.9     Urinalysis: Recent Labs    10/20/24 0058  COLORURINE YELLOW*  LABSPEC 1.024  PHURINE 7.0  GLUCOSEU NEGATIVE  HGBUR SMALL*  BILIRUBINUR NEGATIVE  KETONESUR NEGATIVE  PROTEINUR >=300*  NITRITE NEGATIVE  LEUKOCYTESUR LARGE*       Imaging: DG Abd 1 View Result Date: 10/21/2024 CLINICAL DATA:  Ileus. EXAM: ABDOMEN - 1 VIEW COMPARISON:  10/14/2024. FINDINGS: Air distended stomach. Dilated gas and stool-filled colon. Stool in the rectosigmoid colon. Visualized lung bases show bilateral pleural effusions. IMPRESSION: Colonic ileus with moderate to severe stool burden. Electronically Signed   By: Newell Eke M.D.   On: 10/21/2024 10:04   DG Chest Port 1 View Result Date: 10/19/2024 EXAM: 1 VIEW(S) XRAY OF THE CHEST 10/19/2024 11:50:22 PM COMPARISON: None available. CLINICAL HISTORY: Questionable sepsis - evaluate for abnormality. Questionable sepsis. Evaluate for abnormality. FINDINGS: LINES, TUBES AND DEVICES: Left dialysis catheter in similar position with tip likely at the confluence of the left brachiocephalic vein and superior vena cava. LUNGS AND PLEURA: Persistent trace bilateral pleural effusions. Persistent Left retrocardiac airspace opacity. Increased interstitial markings slightly improved. No pneumothorax. HEART AND MEDIASTINUM: No acute abnormality of the cardiac and mediastinal silhouettes. BONES AND  SOFT TISSUES: Thoracolumbar surgical hardware. IMPRESSION: 1. Persistent left retrocardiac airspace opacity. 2. Persistent trace bilateral pleural effusions. 3. Increased interstitial markings, slightly improved. Electronically signed by: Morgane Naveau MD 10/19/2024 11:53 PM EST RP Workstation: [id number]     Medications:    ceFEPime  (MAXIPIME ) IV     doxycycline  (VIBRAMYCIN ) IV 100 mg (10/21/24 1030)   furosemide  100 mg (10/21/24 0853)   [START ON 10/23/2024] vancomycin       bisacodyl   10 mg Oral QHS   bisacodyl   10 mg Rectal Daily   calcitRIOL   0.5 mcg Oral Daily   carvedilol   6.25 mg Oral BID WC   Chlorhexidine  Gluconate Cloth  6 each Topical Q0600   diltiazem   240 mg Oral QPM   doxepin   10 mg Oral QHS   famotidine   10 mg Oral Daily   gabapentin   300 mg Oral QHS   heparin   5,000 Units Subcutaneous Q12H  insulin  aspart  0-9 Units Subcutaneous TID WC   insulin  glargine-yfgn  20 Units Subcutaneous Daily   lactulose   20 g Oral BID   levothyroxine   175 mcg Oral QHS   linaclotide   145 mcg Oral QAC breakfast   loratadine   10 mg Oral Daily   melatonin  10 mg Oral QHS   midodrine   20 mg Oral Once per day on Monday Wednesday Friday   midodrine   20 mg Oral Once   mineral oil  1 enema Rectal QPM   polyethylene glycol  17 g Oral BID   [START ON 10/22/2024] predniSONE   5 mg Oral Q breakfast   senna-docusate  1 tablet Oral BID   simethicone   80 mg Oral QID   sodium chloride  flush  3 mL Intravenous Q12H   acetaminophen , bisacodyl , ipratropium-albuterol , metoCLOPramide  (REGLAN ) injection, ondansetron  (ZOFRAN ) IV, mouth rinse, oxyCODONE , sodium chloride  flush  Assessment/ Plan:  Mr. Donevan Biller is a 64 y.o.  male with past medical conditions including diabetes, hepatitis C, bilateral BKA, anxiety and depression, and end-stage renal disease on hemodialysis.  Patient presents to the emergency department from his facility complaining of shortness of breath.  Patient is currently admitted for  CHF (congestive heart failure) (HCC) [I50.9] Acute respiratory failure with hypoxia (HCC) [J96.01] MRSA bacteremia [R78.81, B95.62] Hypervolemia, unspecified hypervolemia type [E87.70] Sepsis, due to unspecified organism, unspecified whether acute organ dysfunction present (HCC) [A41.9]  [facility]/MWF/120.5 kg  End-stage renal disease with hyperkalemia on hemodialysis.   Potassium corrected with shifting measures, 5.1 today. Will perform dialysis treatment today with fluid removal. Next treatment scheduled for Friday.    Anemia of chronic kidney disease Lab Results  Component Value Date   HGB 7.9 (L) 10/21/2024   Hemoglobin 7.9, will order low-dose IV Retacrit  4000 units with dialysis today.  3. Secondary Hyperparathyroidism: with outpatient labs: None available  Lab Results  Component Value Date   PTH 177 (H) 04/23/2024   CALCIUM  7.8 (L) 10/21/2024   PHOS 9.7 (H) 10/14/2024   Hyperphosphatemia noted, will order calcium  acetate with meals.      LOS: 1 Demyah Smyre 1/28/20261:17 PM   "

## 2024-10-21 NOTE — Consult Note (Signed)
 Pharmacy Antibiotic Note  Eugene [name] is a 64 y.o. male admitted on 10/19/2024 with bacteremia.  PMH includes MRSA bacteremia on vancomycin , ESRD on HD MWF, chronic hypoxic respiratory failure on 6 L continuously, bilateral BKA, chronic HFpEF, IDDM, HTN, HLD<hypothyroidism, chronic pain syndrome, obesity,  Pharmacy has been consulted for Vancomycin  dosing. Patient recently discharged with OPAT (see below)  Indication: MRSA bacteremia Regimen: vancomycin  1000mg  IV with HD on MWF* End date: 11/11/2024  Patient got extra Vancomycin  dose on Tuesday Jan/27   1/28: Vancomycin  random level this AM was 25 mcg/dL, which is above goal ~79. Patient received an extra Vancomycin  dose yesterday, however, did not end up getting HD, which could be contributing to the elevated level. Tentative plan to get HD today.  Plan: Will hold Vancomycin  1000 mg IV dose for today Will check another Vancomycin  random level tomorrow with AM labs due to elevated Vancomycin  level.  Will tentatively plan to resume Vancomycin  1000mg  IV every M-W-F at dialysis with next HD session on Friday. Follow cultures and clinical changes to adjust therapy as needed  Temp (24hrs), Avg:98.4 F (36.9 C), Min:97.9 F (36.6 C), Max:99.5 F (37.5 C)  Recent Labs  Lab 10/14/24 1420 10/20/24 0025 10/20/24 0213 10/20/24 2054 10/21/24 0450  WBC 6.8 10.8*  --   --  6.8  CREATININE 5.08* 5.69*  --  5.84* 5.87*  LATICACIDVEN  --  1.2 1.0  --   --   VANCORANDOM  --   --   --   --  25    Estimated Creatinine Clearance: 17.5 mL/min (A) (by C-G formula based on SCr of 5.87 mg/dL (H)).    Allergies[1]  Antimicrobials this admission: Vancomycin  1/27 >> (end date 2/18)   Microbiology results: 1/26 Bcx collected (in process) 1/8 BCx: MRSA bacteremia   Thank you for allowing pharmacy to be a part of this patients care.   Ransom Blanch PGY-1 Pharmacy Resident  [place] [facility]  10/21/2024 11:21 AM     [1] No Known  Allergies

## 2024-10-21 NOTE — Consult Note (Signed)
 "    PULMONOLOGY         Date: 10/21/2024,   MRN# [id number] Eugene [name] 1961/04/13     AdmissionWeight: 124.5 kg                 CurrentWeight: 124.5 kg  Referring provider: Dr Von   CHIEF COMPLAINT:   Acute hypoxemic respiratory failure    HISTORY OF PRESENT ILLNESS   This is a 64 year old male DNR CODE STATUS history of osteomyelitis, spinal stenosis, IBS, dyslipidemia HCV, ESRD diabetes depression anxiety, recent diagnosis of MRSA bacteremia completed vancomycin  at baseline is hypoxemic with requirement of supplemental oxygen at 6 L/min, has bilateral BKA, chronic diastolic heart failure, recently completed negative endocarditis workup.  Came into the hospital due to worsening hypoxemia requiring 15 L of supplemental O2 on arrival in the ER.  He was found to be febrile with worsening hemoglobin and renal function.  Chest x-ray showed bilateral multifocal pneumonia.  Despite maximal medical management patient continues to have significant hypoxemia with difficulty weaning oxygen requirement.  PCCM consultation for further evaluation management.   PAST MEDICAL HISTORY   Past Medical History:  Diagnosis Date   Anxiety    Depression    Diabetes mellitus without complication (HCC)    End stage renal disease (HCC)    Hepatitis C    Hyperlipidemia    IBS (irritable bowel syndrome)    Osteomyelitis (HCC)    Spinal stenosis      SURGICAL HISTORY   Past Surgical History:  Procedure Laterality Date   BACK SURGERY     bilateral amputation Bilateral    BKA Bilateral    CENTRAL LINE INSERTION-TUNNELED N/A 02/02/2019   Procedure: CENTRAL LINE INSERTION-TUNNELED;  Surgeon: Marea Selinda RAMAN, MD;  Location: [facility];  Service: Cardiovascular;  Laterality: N/A;   COLONOSCOPY WITH PROPOFOL  N/A 01/12/2020   Procedure: COLONOSCOPY WITH PROPOFOL ;  Surgeon: Jinny Carmine, MD;  Location: [facility];  Service: Endoscopy;  Laterality: N/A;   DIALYSIS/PERMA CATHETER  INSERTION N/A 05/22/2024   Procedure: DIALYSIS/PERMA CATHETER INSERTION;  Surgeon: Jama Cordella MATSU, MD;  Location: [facility];  Service: Cardiovascular;  Laterality: N/A;   DIALYSIS/PERMA CATHETER INSERTION N/A 07/21/2024   Procedure: DIALYSIS/PERMA CATHETER INSERTION;  Surgeon: Marea Selinda RAMAN, MD;  Location: [facility];  Service: Cardiovascular;  Laterality: N/A;   DIALYSIS/PERMA CATHETER INSERTION N/A 10/05/2024   Procedure: DIALYSIS/PERMA CATHETER INSERTION;  Surgeon: Marea Selinda RAMAN, MD;  Location: [facility];  Service: Cardiovascular;  Laterality: N/A;   DIALYSIS/PERMA CATHETER REMOVAL N/A 10/01/2024   Procedure: DIALYSIS/PERMA CATHETER REMOVAL;  Surgeon: Marea Selinda RAMAN, MD;  Location: [facility];  Service: Cardiovascular;  Laterality: N/A;   ESOPHAGOGASTRODUODENOSCOPY (EGD) WITH PROPOFOL  N/A 12/07/2019   Procedure: ESOPHAGOGASTRODUODENOSCOPY (EGD) WITH PROPOFOL ;  Surgeon: Unk Corinn Skiff, MD;  Location: [facility];  Service: Gastroenterology;  Laterality: N/A;   IR CATHETER TUBE CHANGE  12/04/2019   SPINAL FUSION     THORACIC SPINE SURGERY  01/2019   extensive washout     FAMILY HISTORY   Family History  Problem Relation Age of Onset   Kidney failure Father    Kidney failure Brother      SOCIAL HISTORY   Social History[1]   MEDICATIONS    Home Medication:    Current Medication: Current Medications[2]    ALLERGIES   Patient has no known allergies.     REVIEW OF SYSTEMS    Review of Systems:  Gen:  Denies  fever, sweats, chills weigh loss  HEENT: Denies blurred vision, double vision, ear pain, eye pain, hearing loss, nose bleeds, sore throat Cardiac:  No dizziness, chest pain or heaviness, chest tightness,edema Resp:   reports dyspnea chronically  Gi: Denies swallowing difficulty, stomach pain, nausea or vomiting, diarrhea, constipation, bowel incontinence Gu:  Denies bladder incontinence, burning urine Ext:    Denies Joint pain, stiffness or swelling Skin: Denies  skin rash, easy bruising or bleeding or hives Endoc:  Denies polyuria, polydipsia , polyphagia or weight change Psych:   Denies depression, insomnia or hallucinations   Other:  All other systems negative   VS: BP 135/71 (BP Location: Right Arm)   Pulse 67   Temp 98 F (36.7 C)   Resp 18   Wt 124.5 kg   SpO2 94%   BMI 39.38 kg/m      PHYSICAL EXAM    GENERAL:NAD, no fevers, chills, no weakness no fatigue HEAD: Normocephalic, atraumatic.  EYES: Pupils equal, round, reactive to light. Extraocular muscles intact. No scleral icterus.  MOUTH: Moist mucosal membrane. Dentition intact. No abscess noted.  EAR, NOSE, THROAT: Clear without exudates. No external lesions.  NECK: Supple. No thyromegaly. No nodules. No JVD.  PULMONARY: decreased breath sounds with mild rhonchi worse at bases bilaterally.  CARDIOVASCULAR: S1 and S2. Regular rate and rhythm. No murmurs, rubs, or gallops. No edema. Pedal pulses 2+ bilaterally.  GASTROINTESTINAL: Soft, nontender, nondistended. No masses. Positive bowel sounds. No hepatosplenomegaly.  MUSCULOSKELETAL: No swelling, clubbing, or edema. Range of motion full in all extremities.  NEUROLOGIC: Cranial nerves II through XII are intact. No gross focal neurological deficits. Sensation intact. Reflexes intact.  SKIN: No ulceration, lesions, rashes, or cyanosis. Skin warm and dry. Turgor intact.  PSYCHIATRIC: Mood, affect within normal limits. The patient is awake, alert and oriented x 3. Insight, judgment intact.       IMAGING     ASSESSMENT/PLAN   Acute on chronic hypoxemic respiratory failure Patient with multiple etiologies of hypoxemia including  1. Pulmonary edema 2. Pleural effusions 3. Atelectasis 4. CHF 5. Restrictive ventilatory defect due to scoliosis 6. COPD 7. Morbid obesity  8. Physical deconditioning   Pulmonary edema - continue dialysis, net negative goal                - continue lasix  IV ESRD status  2. Pleural effusions - continue with HD 3. Atelectasis - ordering Chest PT with metaneb 4. Possible COPD exacerbation - noted bacterial and viral workup is in progress.  Continue doxycycline /cefepime  for now 4. CHF - acute on chronic - continue metoprolol  , lasix   5. Obesity - dietary changes, lifestyle modification 6. Physical deconditioning - contributing to dyspnea            Thank you for allowing me to participate in the care of this patient.   Patient/Family are satisfied with care plan and all questions have been answered.    Provider disclosure: Patient with at least one acute or chronic illness or injury that poses a threat to life or bodily function and is being managed actively during this encounter.  All of the below services have been performed independently by signing provider:  review of prior documentation from internal and or external health records.  Review of previous and current lab results.  Interview and comprehensive assessment during patient visit today. Review of current and previous chest radiographs/CT scans. Discussion of management and test interpretation with health care team and patient/family.   This  document was prepared using Conservation officer, historic buildings and may include unintentional dictation errors.     Captola Teschner, M.D.  Division of Pulmonary & Critical Care Medicine             [1]  Social History Tobacco Use   Smoking status: Former    Current packs/day: 0.25    Types: Cigarettes   Smokeless tobacco: Never  Vaping Use   Vaping status: Every Day   Substances: Nicotine   Substance Use Topics   Alcohol use: Not Currently   Drug use: Not Currently  [2]  Current Facility-Administered Medications:    acetaminophen  (TYLENOL ) tablet 650 mg, 650 mg, Oral, Q6H PRN, Laurita Cort DASEN, MD   bisacodyl  (DULCOLAX) EC tablet 10 mg, 10 mg, Oral, QHS, Zhang, Ping T, MD, 10 mg at 10/20/24 2229    bisacodyl  (DULCOLAX) suppository 10 mg, 10 mg, Rectal, Daily PRN, Laurita Cort DASEN, MD   bisacodyl  (DULCOLAX) suppository 10 mg, 10 mg, Rectal, Daily, Von Bellis, MD   calcitRIOL  (ROCALTROL ) capsule 0.5 mcg, 0.5 mcg, Oral, Daily, Zhang, Ping T, MD, 0.5 mcg at 10/21/24 9166   carvedilol  (COREG ) tablet 6.25 mg, 6.25 mg, Oral, BID WC, Von Bellis, MD, 6.25 mg at 10/21/24 9167   ceFEPIme  (MAXIPIME ) 1 g in sodium chloride  0.9 % 100 mL IVPB, 1 g, Intravenous, Q24H, Patel, Rutvi M, RPH   ceFEPIme  (MAXIPIME ) 1 g in sodium chloride  0.9 % 100 mL IVPB, 1 g, Intravenous, Once, Patel, Kishan S, RPH   Chlorhexidine  Gluconate Cloth 2 % PADS 6 each, 6 each, Topical, Q0600, Lateef, Munsoor, MD   diltiazem  (CARDIZEM  CD) 24 hr capsule 240 mg, 240 mg, Oral, QPM, Von, Dileep, MD, 240 mg at 10/20/24 1748   doxepin  (SINEQUAN ) capsule 10 mg, 10 mg, Oral, QHS, Zhang, Cort T, MD, 10 mg at 10/20/24 2226   doxycycline  (VIBRAMYCIN ) 100 mg in sodium chloride  0.9 % 250 mL IVPB, 100 mg, Intravenous, Q12H, Laurita Cort T, MD, Last Rate: 125 mL/hr at 10/21/24 1030, 100 mg at 10/21/24 1030   famotidine  (PEPCID ) tablet 10 mg, 10 mg, Oral, Daily, Laurita, Ping T, MD, 10 mg at 10/21/24 9167   furosemide  (LASIX ) 100 mg in dextrose  5 % 50 mL IVPB, 100 mg, Intravenous, BID, Laurita Cort T, MD, Last Rate: 60 mL/hr at 10/21/24 0853, 100 mg at 10/21/24 0853   gabapentin  (NEURONTIN ) capsule 300 mg, 300 mg, Oral, QHS, Laurita Cort T, MD, 300 mg at 10/20/24 2229   heparin  injection 5,000 Units, 5,000 Units, Subcutaneous, Q12H, Laurita Cort T, MD, 5,000 Units at 10/21/24 9167   insulin  aspart (novoLOG ) injection 0-9 Units, 0-9 Units, Subcutaneous, TID WC, Laurita Cort DASEN, MD, 5 Units at 10/21/24 1203   insulin  glargine-yfgn injection 20 Units, 20 Units, Subcutaneous, Daily, Laurita Cort T, MD, 20 Units at 10/21/24 1012   ipratropium-albuterol  (DUONEB) 0.5-2.5 (3) MG/3ML nebulizer solution 3 mL, 3 mL, Nebulization, Q6H PRN, Laurita Cort T, MD    lactulose  (CHRONULAC ) 10 GM/15ML solution 20 g, 20 g, Oral, BID, Laurita Cort T, MD, 20 g at 10/21/24 9167   levothyroxine  (SYNTHROID ) tablet 175 mcg, 175 mcg, Oral, QHS, Laurita Cort T, MD, 175 mcg at 10/20/24 2231   linaclotide  (LINZESS ) capsule 145 mcg, 145 mcg, Oral, QAC breakfast, Laurita Cort T, MD, 145 mcg at 10/21/24 9166   loratadine  (CLARITIN ) tablet 10 mg, 10 mg, Oral, Daily, Laurita Cort T, MD, 10 mg at 10/21/24 9166   melatonin tablet 10 mg, 10 mg, Oral, QHS, Zhang, Ping T,  MD, 10 mg at 10/20/24 2229   metoCLOPramide  (REGLAN ) injection 5 mg, 5 mg, Intravenous, Q6H PRN, Zhang, Ping T, MD   midodrine  (PROAMATINE ) tablet 20 mg, 20 mg, Oral, Once per day on Monday Wednesday Friday, Zhang, Ping T, MD   midodrine  (PROAMATINE ) tablet 20 mg, 20 mg, Oral, Once, Laurita Manor T, MD   ondansetron  (ZOFRAN ) injection 4 mg, 4 mg, Intravenous, Q6H PRN, Laurita Manor T, MD, 4 mg at 10/21/24 1009   Oral care mouth rinse, 15 mL, Mouth Rinse, PRN, Foust, Katy L, NP   oxyCODONE  (Oxy IR/ROXICODONE ) immediate release tablet 5 mg, 5 mg, Oral, Q6H PRN, Von Bellis, MD, 5 mg at 10/21/24 1009   polyethylene glycol (MIRALAX  / GLYCOLAX ) packet 17 g, 17 g, Oral, BID, Zhang, Ping T, MD, 17 g at 10/21/24 9167   predniSONE  (DELTASONE ) tablet 10 mg, 10 mg, Oral, Q breakfast, Laurita Manor T, MD, 10 mg at 10/21/24 9166   senna-docusate (Senokot-S) tablet 1 tablet, 1 tablet, Oral, BID, Laurita Manor T, MD, 1 tablet at 10/21/24 9167   simethicone  (MYLICON) chewable tablet 80 mg, 80 mg, Oral, QID, Zhang, Ping T, MD, 80 mg at 10/21/24 9167   sodium chloride  flush (NS) 0.9 % injection 3 mL, 3 mL, Intravenous, Q12H, Laurita Manor T, MD, 3 mL at 10/21/24 9161   sodium chloride  flush (NS) 0.9 % injection 3 mL, 3 mL, Intravenous, PRN, Zhang, Ping T, MD   [START ON 10/23/2024] vancomycin  (VANCOCIN ) IVPB 1000 mg/200 mL premix, 1,000 mg, Intravenous, Q M,W,F-HD, Tobie Ransom HERO, RPH  "

## 2024-10-21 NOTE — TOC Progression Note (Signed)
 Transition of Care [facility]) - Progression Note    Patient Details  Name: Eugene [name] MRN: [id number] Date of Birth: 01/12/1961  Transition of Care [facility] CM/SW Contact  Shasta DELENA Daring, RN Phone Number: 10/21/2024, 3:19 PM  Clinical Narrative:     Readmission risk assessment complete. Patient is LTC resident at [facility].  Will follow to facilitate discharge when medically stable,  Expected Discharge Plan: Skilled Nursing Facility Barriers to Discharge: Continued Medical Work up               Expected Discharge Plan and Services       Living arrangements for the past 2 months: Skilled Nursing Facility                                       Social Drivers of Health (SDOH) Interventions SDOH Screenings   Food Insecurity: No Food Insecurity (10/20/2024)  Housing: Low Risk (10/20/2024)  Transportation Needs: No Transportation Needs (10/20/2024)  Utilities: Not At Risk (10/20/2024)  Social Connections: Socially Isolated (10/20/2024)  Tobacco Use: Medium Risk (10/20/2024)    Readmission Risk Interventions    10/21/2024    3:16 PM 10/20/2024    1:24 PM 05/05/2024    3:05 PM  Readmission Risk Prevention Plan  Transportation Screening Complete Complete Complete  Medication Review [name]) Complete Complete Complete  PCP or Specialist appointment within 3-5 days of discharge -- Complete Complete  HRI or Home Care Consult Complete    SW Recovery Care/Counseling Consult  Complete Complete  Palliative Care Screening Not Applicable Not Applicable Not Applicable  Skilled Nursing Facility Complete Complete Complete

## 2024-10-21 NOTE — Inpatient Diabetes Management (Addendum)
 Inpatient Diabetes Program Recommendations  AACE/ADA: New Consensus Statement on Inpatient Glycemic Control (2015)  Target Ranges:  Prepandial:   less than 140 mg/dL      Peak postprandial:   less than 180 mg/dL (1-2 hours)      Critically ill patients:  140 - 180 mg/dL    Latest Reference Range & Units 10/20/24 12:37 10/20/24 17:41 10/20/24 20:40 10/21/24 07:40  Glucose-Capillary 70 - 99 mg/dL  20 units Semglee  @1034  249 (H)  3 units Novolog   298 (H)  5 units Novolog   322 (H) 274 (H)  (H): Data is abnormally high   SNF Meds: Semglee  20 units BID         Humalog  0-15 QID per SSI   Current Orders: Semglee  20 units daily         Novolog  0-9 units TID        Prednisone  10 mg daily    Started Semglee  yest at 10am Started Novolog  yest at 12pm   MD- Note CBG 274 this AM  Please consider increasing the Semglee  to 20 units BID (home dose)  Please also add the 0-5 unit Bedtime SSI scale as well    --Will follow patient during hospitalization--  Adina Rudolpho Arrow RN, MSN, CDCES Diabetes Coordinator Inpatient Glycemic Control Team Team Pager: [phone] (8a-5p)

## 2024-10-21 NOTE — Plan of Care (Signed)

## 2024-10-21 NOTE — Plan of Care (Signed)
?  Problem: Clinical Measurements: ?Goal: Respiratory complications will improve ?Outcome: Progressing ?Goal: Cardiovascular complication will be avoided ?Outcome: Progressing ?  ?Problem: Nutrition: ?Goal: Adequate nutrition will be maintained ?Outcome: Progressing ?  ?Problem: Safety: ?Goal: Ability to remain free from injury will improve ?Outcome: Progressing ?  ?

## 2024-10-21 NOTE — Progress Notes (Signed)
 Pt refuses to do CPT with MetaNeb but did want PRN neb treatment. Treatment given and pt remains on 10L HFNC

## 2024-10-21 NOTE — Progress Notes (Addendum)
 [facility] Progress Note  Patient: Eugene [name]    FMW:[id number]  DOA: 10/19/2024     Date of Service: the patient was seen and examined on 10/21/2024  Chief Complaint  Patient presents with   Shortness of Breath   Brief hospital course: Eugene [name] is a 64 y.o. male with medical history significant of recently diagnosed MRSA bacteremia on vancomycin , ESRD on HD MWF, chronic hypoxic respiratory failure on 6 L continuously, bilateral BKA, chronic HFpEF, IDDM, HTN, HLD<hypothyroidism, chronic pain syndrome, obesity, presented with worsening of cough and hypoxia.   Patient was recently hospitalized for sepsis when he was diagnosed with MRSA bacteremia, endocarditis workup negative.  Subsequently patient was discharged home on vancomycin  Monday Wednesday Friday.  Last 2 days patient developed a dry cough with increasing hypoxia, this morning patient was found to be hypoxic and was placed on 15 L.  Patient denies any chest pain, no nausea vomiting no diarrhea.  He still makes urine but denied any dysuria no back pains. ED Course: Temperature 100.9, blood pressure 140/80 O2 saturation 89% on 10 L and stabilized on NRB but eventually stabilized on 6 L.  Chest x-ray showed bilateral pulmonary congestion.  Blood work showed WBC 10.8 hemoglobin 9.1 BUN 48 creatinine 5.6 bicarb 25, VBG 7.30 2/56/61.   Patient was given vancomycin  and cefepime  in the ED.  Nephrology was consulted for emergency HD.   Assessment and Plan:  Acute on chronic hypoxic respiratory failure Acute on chronic HFpEF decompensation -Stabilized - Secondary to fluid overload - Emergency HD this morning - Other DDx, patient has a concurrent low-grade fever, chest x-ray showed bilateral infiltrates most compatible with pulmonary congestion.  Atypical pneumonia cannot be ruled out, will check atypical pneumonia study Legionella and mycoplasma.  For now we will cover him with doxycycline .  Respiratory viral panel  negative Pulmonary consult  UTI Urine culture growing Enterococcus faecalis and Proteus, follow sensitivity Continue Vanco and cefepime , pharmacy consulted for dosing and trough monitoring     # Chronic constipation 1/28 KUB: Colonic ileus with moderate to severe stool burden  Last time patient had several situation and he refused NG tube and n.p.o. status.  Continue laxatives orally, use Dulcolax suppository and enema.   Hyperkalemia - Urgency dialysis   Recently diagnosed MRSA bacteremia - Continue vancomycin  Monday Wednesday Friday, last dose will be November 11, 2024.   IDDM with hyperglycemia - Continue Lantus  and SSI   ESRD on HD - As above   Paroxysmal SVT - In sinus rhythm, continue Cardizem  and Coreg    COPD - No signs of acute COPD exacerbation - Continue as needed breathing meds   Chronic pain syndrome - As needed oxycodone    Obesity class II - BMI= 39 - Calorie control recommended     Body mass index is 39.38 kg/m.  Interventions:  Wound 09/29/24 1832 Pressure Injury Buttocks Right;Left Stage 2 -  Partial thickness loss of dermis presenting as a shallow open injury with a red, pink wound bed without slough. (Active)     Diet: Renal/carb modified diet DVT Prophylaxis: Subcutaneous Heparin     Advance goals of care discussion: DNR-limited  Family Communication: family was not present at bedside, at the time of interview.  The pt provided permission to discuss medical plan with the family. Opportunity was given to ask question and all questions were answered satisfactorily.   Disposition:  Pt is from SNF LTC [facility], admitted with respiratory failure, pneumonia and UTI, still on antibiotics and has respiratory distress, which  precludes a safe discharge. Discharge to SNF back to LTC [facility], when stable, most likely in few days.  Subjective: No significant events overnight.  Patient's breathing is getting better, still has chronic pain, generalized.  Has  not moved bowels in few days.  Patient agreed for suppository and possible enema after hemodialysis today Patient denied any other complaints.   Physical Exam: General: NAD, lying comfortably Appear in no distress, affect appropriate Eyes: PERRLA ENT: Oral Mucosa Clear, moist  Neck: no JVD,  Cardiovascular: S1 and S2 Present, no Murmur,  Respiratory: good air entry bilaterally, bibasilar crackles, no wheezes. Abdomen: BS present, Soft, but obese and distended, no tenderness,  Skin: no rashes Extremities: s/p  b/l BKA Neurologic: without any new focal findings Gait not checked due to patient safety concerns  Vitals:   10/21/24 0500 10/21/24 0741 10/21/24 1157 10/21/24 1628  BP:  131/67 135/71 130/72  Pulse:  71 67 70  Resp:  16 18 18   Temp:  98.1 F (36.7 C) 98 F (36.7 C) 98.2 F (36.8 C)  TempSrc:  Oral  Oral  SpO2:  95% 94% 97%  Weight: 124.5 kg       Intake/Output Summary (Last 24 hours) at 10/21/2024 1742 Last data filed at 10/21/2024 1416 Gross per 24 hour  Intake 1807.84 ml  Output 1500 ml  Net 307.84 ml   Filed Weights   10/21/24 0500  Weight: 124.5 kg    Data Reviewed: I have personally reviewed and interpreted daily labs, tele strips, imagings as discussed above. I reviewed all nursing notes, pharmacy notes, vitals, pertinent old records I have discussed plan of care as described above with RN and patient/family.  CBC: Recent Labs  Lab 10/20/24 0025 10/21/24 0450  WBC 10.8* 6.8  NEUTROABS 6.9  --   HGB 9.1* 7.9*  HCT 29.3* 25.7*  MCV 94.2 94.8  PLT 257 193   Basic Metabolic Panel: Recent Labs  Lab 10/20/24 0025 10/20/24 2054 10/21/24 0450  NA 132* 130* 131*  K 5.9* 5.6* 5.1  CL 94* 94* 94*  CO2 25 25 26   GLUCOSE 232* 350* 312*  BUN 48* 55* 57*  CREATININE 5.69* 5.84* 5.87*  CALCIUM  8.4* 7.8* 7.8*    Studies: DG Abd 1 View Result Date: 10/21/2024 CLINICAL DATA:  Ileus. EXAM: ABDOMEN - 1 VIEW COMPARISON:  10/14/2024. FINDINGS: Air  distended stomach. Dilated gas and stool-filled colon. Stool in the rectosigmoid colon. Visualized lung bases show bilateral pleural effusions. IMPRESSION: Colonic ileus with moderate to severe stool burden. Electronically Signed   By: Newell Eke M.D.   On: 10/21/2024 10:04    Scheduled Meds:  bisacodyl   10 mg Oral QHS   bisacodyl   10 mg Rectal Daily   calcitRIOL   0.5 mcg Oral Daily   calcium  acetate  667 mg Oral TID WC   carvedilol   6.25 mg Oral BID WC   Chlorhexidine  Gluconate Cloth  6 each Topical Q0600   diltiazem   240 mg Oral QPM   doxepin   10 mg Oral QHS   epoetin  alfa-epbx (RETACRIT ) injection  4,000 Units Intravenous Q M,W,F-1800   famotidine   10 mg Oral Daily   gabapentin   300 mg Oral QHS   heparin   5,000 Units Subcutaneous Q12H   insulin  aspart  0-9 Units Subcutaneous TID WC   insulin  glargine-yfgn  20 Units Subcutaneous Daily   lactulose   20 g Oral BID   levothyroxine   175 mcg Oral QHS   linaclotide   145 mcg Oral QAC breakfast  loratadine   10 mg Oral Daily   melatonin  10 mg Oral QHS   midodrine   20 mg Oral Once per day on Monday Wednesday Friday   midodrine   20 mg Oral Once   mineral oil  1 enema Rectal QPM   polyethylene glycol  17 g Oral BID   [START ON 10/22/2024] predniSONE   5 mg Oral Q breakfast   senna-docusate  1 tablet Oral BID   simethicone   80 mg Oral QID   sodium chloride  flush  3 mL Intravenous Q12H   Continuous Infusions:  ceFEPime  (MAXIPIME ) IV     doxycycline  (VIBRAMYCIN ) IV Stopped (10/21/24 1230)   furosemide  Stopped (10/21/24 0953)   [START ON 10/23/2024] vancomycin      PRN Meds: acetaminophen , bisacodyl , ipratropium-albuterol , metoCLOPramide  (REGLAN ) injection, ondansetron  (ZOFRAN ) IV, mouth rinse, oxyCODONE , sodium chloride  flush  Time spent: 55 minutes  Author: ELVAN SOR. MD [facility] 10/21/2024 5:42 PM  To reach On-call, see care teams to locate the attending and reach out to them via [email or web address]. If 7PM-7AM, please  contact night-coverage If you still have difficulty reaching the attending provider, please page the [facility] (Director on Call) for [facility] on amion for assistance.

## 2024-10-22 ENCOUNTER — Inpatient Hospital Stay

## 2024-10-22 DIAGNOSIS — J9601 Acute respiratory failure with hypoxia: Secondary | ICD-10-CM | POA: Diagnosis not present

## 2024-10-22 LAB — BASIC METABOLIC PANEL WITH GFR
Anion gap: 11 (ref 5–15)
BUN: 65 mg/dL — ABNORMAL HIGH (ref 8–23)
CO2: 26 mmol/L (ref 22–32)
Calcium: 7.6 mg/dL — ABNORMAL LOW (ref 8.9–10.3)
Chloride: 94 mmol/L — ABNORMAL LOW (ref 98–111)
Creatinine, Ser: 5.97 mg/dL — ABNORMAL HIGH (ref 0.61–1.24)
GFR, Estimated: 10 mL/min — ABNORMAL LOW
Glucose, Bld: 310 mg/dL — ABNORMAL HIGH (ref 70–99)
Potassium: 5.8 mmol/L — ABNORMAL HIGH (ref 3.5–5.1)
Sodium: 131 mmol/L — ABNORMAL LOW (ref 135–145)

## 2024-10-22 LAB — MYCOPLASMA PNEUMONIAE ANTIBODY, IGM: Mycoplasma pneumo IgM: <770 U/mL (ref 0–769)

## 2024-10-22 LAB — URINE CULTURE: Culture: >=100000 — AB

## 2024-10-22 LAB — CBC
HCT: 24.9 % — ABNORMAL LOW (ref 39.0–52.0)
Hemoglobin: 7.8 g/dL — ABNORMAL LOW (ref 13.0–17.0)
MCH: 30.2 pg (ref 26.0–34.0)
MCHC: 31.3 g/dL (ref 30.0–36.0)
MCV: 96.5 fL (ref 80.0–100.0)
Platelets: 190 10*3/uL (ref 150–400)
RBC: 2.58 MIL/uL — ABNORMAL LOW (ref 4.22–5.81)
RDW: 14.4 % (ref 11.5–15.5)
WBC: 7.2 10*3/uL (ref 4.0–10.5)
nRBC: 0 % (ref 0.0–0.2)

## 2024-10-22 LAB — PHOSPHORUS: Phosphorus: 7.6 mg/dL — ABNORMAL HIGH (ref 2.5–4.6)

## 2024-10-22 LAB — GLUCOSE, CAPILLARY
Glucose-Capillary: 273 mg/dL — ABNORMAL HIGH (ref 70–99)
Glucose-Capillary: 384 mg/dL — ABNORMAL HIGH (ref 70–99)

## 2024-10-22 LAB — C-REACTIVE PROTEIN: CRP: 3.4 mg/dL — ABNORMAL HIGH

## 2024-10-22 LAB — MAGNESIUM: Magnesium: 2 mg/dL (ref 1.7–2.4)

## 2024-10-22 MED ORDER — DOXYCYCLINE HYCLATE 100 MG PO TABS
100.0000 mg | ORAL_TABLET | Freq: Two times a day (BID) | ORAL | Status: AC
  Administered 2024-10-22 – 2024-10-26 (×9): 100 mg via ORAL
  Filled 2024-10-22 (×9): qty 1

## 2024-10-22 MED ORDER — INSULIN GLARGINE-YFGN 100 UNIT/ML ~~LOC~~ SOLN
20.0000 [IU] | Freq: Two times a day (BID) | SUBCUTANEOUS | Status: DC
  Administered 2024-10-22 – 2024-10-24 (×5): 20 [IU] via SUBCUTANEOUS
  Filled 2024-10-22 (×6): qty 0.2

## 2024-10-22 MED ORDER — ALTEPLASE 2 MG IJ SOLR
INTRAMUSCULAR | Status: AC
  Filled 2024-10-22: qty 4

## 2024-10-22 MED ORDER — HEPARIN SODIUM (PORCINE) 1000 UNIT/ML IJ SOLN
INTRAMUSCULAR | Status: AC
  Filled 2024-10-22: qty 1

## 2024-10-22 MED ORDER — STERILE WATER FOR INJECTION IJ SOLN
INTRAMUSCULAR | Status: AC
  Filled 2024-10-22: qty 10

## 2024-10-22 MED ORDER — HEPARIN SODIUM (PORCINE) 1000 UNIT/ML IJ SOLN
INTRAMUSCULAR | Status: AC
  Filled 2024-10-22: qty 4

## 2024-10-22 MED ORDER — HEPARIN SODIUM (PORCINE) 1000 UNIT/ML DIALYSIS
1000.0000 [IU] | INTRAMUSCULAR | Status: DC | PRN
  Administered 2024-10-22: 1000 [IU]
  Filled 2024-10-22: qty 1

## 2024-10-22 MED ORDER — SMOG ENEMA
960.0000 mL | Freq: Every day | RECTAL | Status: AC
  Administered 2024-10-23 – 2024-10-25 (×2): 960 mL via RECTAL
  Filled 2024-10-22: qty 960
  Filled 2024-10-22: qty 240
  Filled 2024-10-22: qty 960

## 2024-10-22 MED ORDER — ALTEPLASE 2 MG IJ SOLR
2.0000 mg | Freq: Once | INTRAMUSCULAR | Status: AC | PRN
  Administered 2024-10-22: 2 mg

## 2024-10-22 MED ORDER — PIPERACILLIN-TAZOBACTAM IN DEX 2-0.25 GM/50ML IV SOLN
2.2500 g | Freq: Three times a day (TID) | INTRAVENOUS | Status: AC
  Administered 2024-10-22 – 2024-10-26 (×12): 2.25 g via INTRAVENOUS
  Filled 2024-10-22 (×16): qty 50

## 2024-10-22 MED ORDER — FUROSEMIDE 10 MG/ML IJ SOLN
60.0000 mg | Freq: Two times a day (BID) | INTRAMUSCULAR | Status: AC
  Administered 2024-10-22 – 2024-10-29 (×12): 60 mg via INTRAVENOUS
  Filled 2024-10-22 (×12): qty 6

## 2024-10-22 MED ORDER — MIDODRINE HCL 5 MG PO TABS
ORAL_TABLET | ORAL | Status: AC
  Filled 2024-10-22: qty 4

## 2024-10-22 MED ORDER — VANCOMYCIN HCL IN DEXTROSE 1-5 GM/200ML-% IV SOLN
1000.0000 mg | Freq: Once | INTRAVENOUS | Status: AC
  Administered 2024-10-22: 1000 mg via INTRAVENOUS
  Filled 2024-10-22: qty 200

## 2024-10-22 NOTE — Inpatient Diabetes Management (Signed)
 Inpatient Diabetes Program Recommendations  AACE/ADA: New Consensus Statement on Inpatient Glycemic Control (2015)  Target Ranges:  Prepandial:   less than 140 mg/dL      Peak postprandial:   less than 180 mg/dL (1-2 hours)      Critically ill patients:  140 - 180 mg/dL    Latest Reference Range & Units 10/21/24 07:40 10/21/24 11:55 10/21/24 16:30 10/21/24 20:08  Glucose-Capillary 70 - 99 mg/dL 725 (H)  5 units Novolog   299 (H)  5 units Novolog   333 (H)  7 units Novolog   327 (H)  (H): Data is abnormally high  Latest Reference Range & Units 10/22/24 13:38  Glucose-Capillary 70 - 99 mg/dL  No CBG for 8am--Off unit in HD 273 (H)  (H): Data is abnormally high  SNF Meds: Semglee  20 units BID         Humalog  0-15 QID per SSI     Current Orders: Semglee  20 units daily                             Novolog  0-9 units TID                            Prednisone  10 mg daily      MD- Please consider increasing the Semglee  to 20 units BID (home dose)   Please also add the 0-5 unit Bedtime SSI scale as well     --Will follow patient during hospitalization--  Adina Rudolpho Arrow RN, MSN, CDCES Diabetes Coordinator Inpatient Glycemic Control Team Team Pager: [phone] (8a-5p)

## 2024-10-22 NOTE — Progress Notes (Signed)
" °   10/22/24 1256  Vitals  Temp 98.2 F (36.8 C)  Temp Source Oral  BP 120/68  MAP (mmHg) 82  BP Location Left Arm  BP Method Automatic  Patient Position (if appropriate) Lying  Pulse Rate 66  ECG Heart Rate 67  Resp 13  Oxygen Therapy  SpO2 100 %  O2 Device Nasal Cannula  Patient Activity (if Appropriate) In bed  Pulse Oximetry Type Continuous  Oximetry Probe Site Changed No  Post Treatment  Dialyzer Clearance Lightly streaked  Hemodialysis Intake (mL) 0 mL  Liters Processed 50  Fluid Removed (mL) 1900 mL  Tolerated HD Treatment Yes  Hemodialysis Catheter Left Internal jugular Double lumen Permanent (Tunneled)  Placement Date/Time: 10/05/24 1000   Serial / Lot #: [id number]  Expiration Date: 09/28/26  Time Out: Correct patient;Correct site;Correct procedure  Maximum sterile barrier precautions: Hand hygiene;Large sterile sheet;Sterile probe cover;Cap;Mask;St...  Site Condition No complications  Blue Lumen Status Flushed;Heparin  locked;Dead end cap in place  Red Lumen Status Flushed;Heparin  locked;Dead end cap in place  Purple Lumen Status N/A  Catheter fill solution Heparin  1000 units/ml  Catheter fill volume (Arterial) 1.9 cc  Catheter fill volume (Venous) 1.9  Dressing Type Transparent  Dressing Status Antimicrobial disc/dressing in place;Clean, Dry, Intact  Interventions New dressing;Dressing changed;Antimicrobial disc changed  Drainage Description None  Dressing Change Due 10/29/24  Post treatment catheter status Capped and Clamped   Pt tolerated tx. Removed 1.9 L Instilled Cath Flow, Midodrine  given for decreased bp.Heparin  given for machine d/t high venous pressure. Poor bfr at the end (200) Report given to floor nurse.  "

## 2024-10-22 NOTE — Consult Note (Addendum)
" °  CLINICAL SUPPORT TEAM - WOUND OSTOMY AND CONTINENCE TEAM  CONSULTATION SERVICES   WOC Nurse-Inpatient Note   WOC Nurse Consult Note: Reason for Consult: requested to assess pressure injury POA. Performed remote evaluation of photos and pertinent notes to determine recommendations. Wound type: DTPI on sacrum. Pressure Injury POA: Yes Measurement: see nursing flowsheet - 13 cm x 9 cm total area (multiple injuries). Wound bed: 100% purple Drainage (amount, consistency, odor) None Periwound: Intact, erythema. Dressing procedure/placement/frequency: Cleanse with saline, pat dry. Apply a single layer of Xeroform daily to the wound bed, top with silicone foam dressing. The foam can stay up to 3 days if nor saturated or soiled, of to lift, change the Xeroform and really.  WOC team will not plan to follow further. Please reconsult if further assistance is needed. Thank-you,  Lela Holm MSN, RN, CWCN, CNS.  (Phone [phone])     "

## 2024-10-22 NOTE — Progress Notes (Signed)
 [facility] Progress Note  Patient: Eugene [name]    FMW:[id number]  DOA: 10/19/2024     Date of Service: the patient was seen and examined on 10/22/2024  Chief Complaint  Patient presents with   Shortness of Breath   Brief hospital course: [name] is a 64 y.o. male with medical history significant of recently diagnosed MRSA bacteremia on vancomycin , ESRD on HD MWF, chronic hypoxic respiratory failure on 6 L continuously, bilateral BKA, chronic HFpEF, IDDM, HTN, HLD<hypothyroidism, chronic pain syndrome, obesity, presented with worsening of cough and hypoxia.   Patient was recently hospitalized for sepsis when he was diagnosed with MRSA bacteremia, endocarditis workup negative.  Subsequently patient was discharged home on vancomycin  Monday Wednesday Friday.  Last 2 days patient developed a dry cough with increasing hypoxia, this morning patient was found to be hypoxic and was placed on 15 L.  Patient denies any chest pain, no nausea vomiting no diarrhea.  He still makes urine but denied any dysuria no back pains. ED Course: Temperature 100.9, blood pressure 140/80 O2 saturation 89% on 10 L and stabilized on NRB but eventually stabilized on 6 L.  Chest x-ray showed bilateral pulmonary congestion.  Blood work showed WBC 10.8 hemoglobin 9.1 BUN 48 creatinine 5.6 bicarb 25, VBG 7.30 2/56/61.   Patient was given vancomycin  and cefepime  in the ED.  Nephrology was consulted for emergency HD.   Assessment and Plan:  # Acute on chronic hypoxic respiratory failure # Acute on chronic HFpEF decompensation -Stabilized - Secondary to fluid overload - Emergency HD this morning - Other DDx, patient has a concurrent low-grade fever, chest x-ray showed bilateral infiltrates most compatible with pulmonary congestion.  Atypical pneumonia cannot be ruled out, will check atypical pneumonia study Legionella and mycoplasma.  For now we will cover him with doxycycline .  Respiratory viral panel  negative Pulmonary consult 1/29 CT chest: Improving pulmonary edema, bilateral lower lobe atelectasis, pleural effusion right mild, moderate left effusion possible loculated empyema. Follow-up pulmonary, patient may need chest tube if there is loculated fluid collection?   # UTI Urine culture growing VRE E and Proteus, follow sensitivity Continue Vanco,  pharmacy consulted for dosing and trough monitoring  S/p cefepime  1/29 discontinued cefepime  and started Zosyn  to cover VRE    # Chronic constipation 1/28 KUB: Colonic ileus with moderate to severe stool burden  Last time patient had several situation and he refused NG tube and n.p.o. status.  Continue laxatives orally, use Dulcolax suppository and enema. 1/29 CT A/P: Large volume stool within the rectosigmoid colon, mild stercoral colitis, moderate stool throughout. 1/29 s/p mineral oil enema today, starting smog enema daily tomorrow a.m. for 3 days  # Hyperkalemia: Resolved - Urgency dialysis   Recently diagnosed MRSA bacteremia - Continue vancomycin  Monday Wednesday Friday, last dose will be November 11, 2024.   IDDM with hyperglycemia - Continue Lantus  and SSI   ESRD on HD - As above   Paroxysmal SVT - In sinus rhythm, continue Cardizem  and Coreg    COPD - No signs of acute COPD exacerbation - Continue as needed breathing meds   Chronic pain syndrome - As needed oxycodone    Obesity class II - BMI= 39 - Calorie control recommended     Body mass index is 38.53 kg/m.  Interventions:  Wound 09/29/24 1832 Pressure Injury Buttocks Right;Left Deep Tissue Pressure Injury - Purple or maroon localized area of discolored intact skin or blood-filled blister due to damage of underlying soft tissue from pressure and/or shear. (Active)  Diet: Renal/carb modified diet DVT Prophylaxis: Subcutaneous Heparin     Advance goals of care discussion: DNR-limited  Family Communication: family was not present at bedside, at the  time of interview.  The pt provided permission to discuss medical plan with the family. Opportunity was given to ask question and all questions were answered satisfactorily.   Disposition:  Pt is from SNF [facility], admitted with respiratory failure, pneumonia and UTI, still on antibiotics and has respiratory distress, which precludes a safe discharge. Discharge to SNF back to LTC [facility], when stable, most likely in few days.  Subjective: No significant events overnight.  Patient was seen during hemodialysis, feeling better, still has not had a good BM.  Denies any abdominal pain. Patient agreed for enema today after hemodialysis   Physical Exam: General: NAD, lying comfortably Appear in no distress, affect appropriate Eyes: PERRLA ENT: Oral Mucosa Clear, moist  Neck: no JVD,  Cardiovascular: S1 and S2 Present, no Murmur,  Respiratory: good air entry bilaterally, bibasilar crackles, no wheezes. Abdomen: BS present, Soft, but obese and distended, no tenderness,  Skin: no rashes Extremities: s/p  b/l BKA Neurologic: without any new focal findings Gait not checked due to patient safety concerns  Vitals:   10/22/24 1311 10/22/24 1334 10/22/24 1415 10/22/24 1654  BP:  108/85  120/81  Pulse:  73  66  Resp:  14  14  Temp:  97.8 F (36.6 C)  97.9 F (36.6 C)  TempSrc:  Oral  Oral  SpO2:  97% 94% 94%  Weight: 121.8 kg       Intake/Output Summary (Last 24 hours) at 10/22/2024 1931 Last data filed at 10/22/2024 1425 Gross per 24 hour  Intake 961.24 ml  Output 3200 ml  Net -2238.76 ml   Filed Weights   10/21/24 0500 10/22/24 0754 10/22/24 1311  Weight: 124.5 kg 123.6 kg 121.8 kg    Data Reviewed: I have personally reviewed and interpreted daily labs, tele strips, imagings as discussed above. I reviewed all nursing notes, pharmacy notes, vitals, pertinent old records I have discussed plan of care as described above with RN and patient/family.  CBC: Recent Labs  Lab 10/20/24 0025  10/21/24 0450 10/22/24 0950  WBC 10.8* 6.8 7.2  NEUTROABS 6.9  --   --   HGB 9.1* 7.9* 7.8*  HCT 29.3* 25.7* 24.9*  MCV 94.2 94.8 96.5  PLT 257 193 190   Basic Metabolic Panel: Recent Labs  Lab 10/20/24 0025 10/20/24 2054 10/21/24 0450 10/22/24 0950  NA 132* 130* 131* 131*  K 5.9* 5.6* 5.1 5.8*  CL 94* 94* 94* 94*  CO2 25 25 26 26   GLUCOSE 232* 350* 312* 310*  BUN 48* 55* 57* 65*  CREATININE 5.69* 5.84* 5.87* 5.97*  CALCIUM  8.4* 7.8* 7.8* 7.6*  MG  --   --   --  2.0  PHOS  --   --   --  7.6*    Studies: CT CHEST WO CONTRAST Result Date: 10/22/2024 CLINICAL DATA:  Respiratory illness and bowel obstruction EXAM: CT CHEST, ABDOMEN AND PELVIS WITHOUT CONTRAST TECHNIQUE: Multidetector CT imaging of the chest, abdomen and pelvis was performed following the standard protocol without IV contrast. RADIATION DOSE REDUCTION: This exam was performed according to the departmental dose-optimization program which includes automated exposure control, adjustment of the mA and/or kV according to patient size and/or use of iterative reconstruction technique. COMPARISON:  CT abdomen and pelvis dated 09/30/2024, CTA chest dated 10/06/2024 FINDINGS: CT CHEST FINDINGS Cardiovascular: Left IJ  central venous catheter terminates in the upper SVC. Multichamber cardiomegaly. No significant pericardial fluid/thickening. Dilated main pulmonary artery measures 4.0 cm. Coronary artery calcifications. Mediastinum/Nodes: Imaged thyroid gland without nodules meeting criteria for imaging follow-up by size. Normal esophagus. 10 mm AP window lymph node (2:24). Lungs/Pleura: The central airways are patent. Adherent secretions within the lower trachea extending into right main bronchus. Mild diffuse bronchial wall thickening. Decreased geographic ground-glass opacities with persistent mild interlobular septal thickening. Increased near-complete round atelectasis of the bilateral lower lobes and increased subsegmental  atelectasis of the lingula. No pneumothorax. Unchanged small right and moderate left pleural effusions with associated pleural thickening. Musculoskeletal: No acute or abnormal lytic or blastic osseous lesions. Thoracic spinal fusion hardware spanning T5-T11 appears intact. Unchanged chronic compression fracture of T8 and superior endplate compression deformity of T12. CT ABDOMEN PELVIS FINDINGS Hepatobiliary: No focal hepatic lesions. No intra or extrahepatic biliary ductal dilation. Normal gallbladder. Pancreas: No focal lesions or main ductal dilation. Spleen: Normal in size without focal abnormality. Adrenals/Urinary Tract: No adrenal nodules. No suspicious renal mass, calculi, or hydronephrosis. Urinary bladder is decompressed with suprapubic catheter in-situ. Stomach/Bowel: Normal appearance of the stomach. No evidence of bowel wall thickening, distention, or inflammatory changes. Mild circumferential mural thickening of the rectum. Large volume stool within the rectosigmoid colon. Moderate volume stool throughout the remainder of the colon. The sigmoid colon is deviated into the right lower quadrant. Normal appendix. Vascular/Lymphatic: No significant vascular findings are present. Unchanged 10 mm left para-aortic lymph node (2: 82). Reproductive: Prostate is unremarkable. Other: No free fluid, fluid collection, or free air. Musculoskeletal: No acute or abnormal lytic or blastic osseous findings. Small fat-containing paraumbilical hernia. Unchanged L1 anterior wedging. IMPRESSION: CT CHEST: 1. Improving pulmonary edema. 2. Increased near-complete round atelectasis of the bilateral lower lobes and increased subsegmental atelectasis of the lingula. 3. Unchanged small right and moderate left pleural effusions with associated pleural thickening, which may reflect chronic loculation. Recommend correlation with results of recent thoracentesis for evaluation of empyema. 4. Dilated main pulmonary artery measures 4.0  cm, which can be seen in the setting of pulmonary arterial hypertension. 5. A 10 mm AP window lymph node, likely reactive. CT ABDOMEN AND PELVIS: 1. Large volume stool within the rectosigmoid colon with mild circumferential mural thickening of the rectum, which may reflect stercoral colitis. Moderate volume stool throughout the remainder of the colon. 2. Small fat-containing paraumbilical hernia. Electronically Signed   By: Limin  Xu M.D.   On: 10/22/2024 17:13   CT ABDOMEN PELVIS WO CONTRAST Result Date: 10/22/2024 CLINICAL DATA:  Respiratory illness and bowel obstruction EXAM: CT CHEST, ABDOMEN AND PELVIS WITHOUT CONTRAST TECHNIQUE: Multidetector CT imaging of the chest, abdomen and pelvis was performed following the standard protocol without IV contrast. RADIATION DOSE REDUCTION: This exam was performed according to the departmental dose-optimization program which includes automated exposure control, adjustment of the mA and/or kV according to patient size and/or use of iterative reconstruction technique. COMPARISON:  CT abdomen and pelvis dated 09/30/2024, CTA chest dated 10/06/2024 FINDINGS: CT CHEST FINDINGS Cardiovascular: Left IJ central venous catheter terminates in the upper SVC. Multichamber cardiomegaly. No significant pericardial fluid/thickening. Dilated main pulmonary artery measures 4.0 cm. Coronary artery calcifications. Mediastinum/Nodes: Imaged thyroid gland without nodules meeting criteria for imaging follow-up by size. Normal esophagus. 10 mm AP window lymph node (2:24). Lungs/Pleura: The central airways are patent. Adherent secretions within the lower trachea extending into right main bronchus. Mild diffuse bronchial wall thickening. Decreased geographic ground-glass opacities with persistent mild  interlobular septal thickening. Increased near-complete round atelectasis of the bilateral lower lobes and increased subsegmental atelectasis of the lingula. No pneumothorax. Unchanged small right  and moderate left pleural effusions with associated pleural thickening. Musculoskeletal: No acute or abnormal lytic or blastic osseous lesions. Thoracic spinal fusion hardware spanning T5-T11 appears intact. Unchanged chronic compression fracture of T8 and superior endplate compression deformity of T12. CT ABDOMEN PELVIS FINDINGS Hepatobiliary: No focal hepatic lesions. No intra or extrahepatic biliary ductal dilation. Normal gallbladder. Pancreas: No focal lesions or main ductal dilation. Spleen: Normal in size without focal abnormality. Adrenals/Urinary Tract: No adrenal nodules. No suspicious renal mass, calculi, or hydronephrosis. Urinary bladder is decompressed with suprapubic catheter in-situ. Stomach/Bowel: Normal appearance of the stomach. No evidence of bowel wall thickening, distention, or inflammatory changes. Mild circumferential mural thickening of the rectum. Large volume stool within the rectosigmoid colon. Moderate volume stool throughout the remainder of the colon. The sigmoid colon is deviated into the right lower quadrant. Normal appendix. Vascular/Lymphatic: No significant vascular findings are present. Unchanged 10 mm left para-aortic lymph node (2: 82). Reproductive: Prostate is unremarkable. Other: No free fluid, fluid collection, or free air. Musculoskeletal: No acute or abnormal lytic or blastic osseous findings. Small fat-containing paraumbilical hernia. Unchanged L1 anterior wedging. IMPRESSION: CT CHEST: 1. Improving pulmonary edema. 2. Increased near-complete round atelectasis of the bilateral lower lobes and increased subsegmental atelectasis of the lingula. 3. Unchanged small right and moderate left pleural effusions with associated pleural thickening, which may reflect chronic loculation. Recommend correlation with results of recent thoracentesis for evaluation of empyema. 4. Dilated main pulmonary artery measures 4.0 cm, which can be seen in the setting of pulmonary arterial  hypertension. 5. A 10 mm AP window lymph node, likely reactive. CT ABDOMEN AND PELVIS: 1. Large volume stool within the rectosigmoid colon with mild circumferential mural thickening of the rectum, which may reflect stercoral colitis. Moderate volume stool throughout the remainder of the colon. 2. Small fat-containing paraumbilical hernia. Electronically Signed   By: Limin  Xu M.D.   On: 10/22/2024 17:13    Scheduled Meds:  bisacodyl   10 mg Oral QHS   bisacodyl   10 mg Rectal Daily   calcitRIOL   0.5 mcg Oral Daily   calcium  acetate  667 mg Oral TID WC   carvedilol   6.25 mg Oral BID WC   Chlorhexidine  Gluconate Cloth  6 each Topical Q0600   diltiazem   240 mg Oral QPM   doxepin   10 mg Oral QHS   doxycycline   100 mg Oral Q12H   epoetin  alfa-epbx (RETACRIT ) injection  4,000 Units Intravenous Q M,W,F-1800   famotidine   10 mg Oral Daily   furosemide   60 mg Intravenous BID   gabapentin   300 mg Oral QHS   heparin   5,000 Units Subcutaneous Q12H   insulin  aspart  0-9 Units Subcutaneous TID WC   insulin  glargine-yfgn  20 Units Subcutaneous BID   lactulose   20 g Oral BID   levothyroxine   175 mcg Oral QHS   linaclotide   145 mcg Oral QAC breakfast   loratadine   10 mg Oral Daily   melatonin  10 mg Oral QHS   midodrine   20 mg Oral Once per day on Monday Wednesday Friday   mineral oil  1 enema Rectal QPM   polyethylene glycol  17 g Oral BID   predniSONE   5 mg Oral Q breakfast   senna-docusate  1 tablet Oral BID   simethicone   80 mg Oral QID   sodium chloride  flush  3 mL Intravenous Q12H   Continuous Infusions:  piperacillin -tazobactam (ZOSYN )  IV 2.25 g (10/22/24 1446)   [START ON 10/23/2024] vancomycin      PRN Meds: acetaminophen , bisacodyl , ipratropium-albuterol , metoCLOPramide  (REGLAN ) injection, ondansetron  (ZOFRAN ) IV, mouth rinse, oxyCODONE , sodium chloride  flush  Time spent: 55 minutes  Author: ELVAN SOR. MD [facility] 10/22/2024 7:31 PM  To reach On-call, see care teams to  locate the attending and reach out to them via [email or web address]. If 7PM-7AM, please contact night-coverage If you still have difficulty reaching the attending provider, please page the [facility] (Director on Call) for [facility] on amion for assistance.

## 2024-10-22 NOTE — Plan of Care (Signed)

## 2024-10-22 NOTE — Progress Notes (Signed)
 Pt continues to refuse CPT with MetaNeb and Neb treatment, pt di want a PRN neb treatment with a mask.

## 2024-10-22 NOTE — Progress Notes (Signed)
 " [facility]  ROUNDING NOTE   Subjective:   Eugene [name] is a 64 year old male with past medical conditions including diabetes, hepatitis C, bilateral BKA, anxiety and depression, and end-stage renal disease on hemodialysis.  Patient presents to the emergency department from his facility complaining of shortness of breath.  Patient is currently admitted for CHF (congestive heart failure) (HCC) [I50.9] Acute respiratory failure with hypoxia (HCC) [J96.01] MRSA bacteremia [R78.81, B95.62] Hypervolemia, unspecified hypervolemia type [E87.70] Sepsis, due to unspecified organism, unspecified whether acute organ dysfunction present [facility]) [A41.9]  Patient is known to our practice and receives outpatient dialysis treatments at [facility] on a MWF schedule, supervised by Dr. Dennise.    Patient seen and evaluated during dialysis   HEMODIALYSIS FLOWSHEET:  Blood Flow Rate (mL/min): 259 mL/min Arterial Pressure (mmHg): -269.68 mmHg Venous Pressure (mmHg): 130.7 mmHg TMP (mmHg): 33.94 mmHg Ultrafiltration Rate (mL/min): 1267 mL/min Dialysate Flow Rate (mL/min): 299 ml/min  Received cathflo in HD access due to lack of pull both ports.    Objective:  Vital signs in last 24 hours:  Temp:  [97.7 F (36.5 C)-98.2 F (36.8 C)] 97.9 F (36.6 C) (01/29 0755) Pulse Rate:  [67-72] 72 (01/29 1000) Resp:  [10-20] 11 (01/29 1000) BP: (130-167)/(64-86) 142/64 (01/29 1000) SpO2:  [92 %-98 %] 97 % (01/29 1000) Weight:  [123.6 kg] 123.6 kg (01/29 0754)  Weight change:  Filed Weights   10/21/24 0500 10/22/24 0754  Weight: 124.5 kg 123.6 kg     Intake/Output: I/O last 3 completed shifts: In: 2769.1 [P.O.:1500; IV Piggyback:1269.1] Out: 1500 [Urine:1500]   Intake/Output this shift:  No intake/output data recorded.  Physical Exam: General: NAD  Head: Normocephalic  Eyes: Anicteric  Lungs:  Coarse, 10 L HFNC  Heart: Regular rate and rhythm  Abdomen:  Soft, nontender   Extremities: No edema.  Bilateral BKA  Neurologic: Awake, alert, conversant  Skin: Warm,dry, no rash  Access: Lt internal jugular permcath    Basic Metabolic Panel: Recent Labs  Lab 10/20/24 0025 10/20/24 2054 10/21/24 0450 10/22/24 0950  NA 132* 130* 131* 131*  K 5.9* 5.6* 5.1 5.8*  CL 94* 94* 94* 94*  CO2 25 25 26 26   GLUCOSE 232* 350* 312* 310*  BUN 48* 55* 57* 65*  CREATININE 5.69* 5.84* 5.87* 5.97*  CALCIUM  8.4* 7.8* 7.8* 7.6*  MG  --   --   --  2.0  PHOS  --   --   --  7.6*    Liver Function Tests: Recent Labs  Lab 10/20/24 0025  AST 27  ALT 17  ALKPHOS 81  BILITOT 0.4  PROT 8.1  ALBUMIN  3.2*   No results for input(s): LIPASE, AMYLASE in the last 168 hours.  No results for input(s): AMMONIA in the last 168 hours.  CBC: Recent Labs  Lab 10/20/24 0025 10/21/24 0450 10/22/24 0950  WBC 10.8* 6.8 7.2  NEUTROABS 6.9  --   --   HGB 9.1* 7.9* 7.8*  HCT 29.3* 25.7* 24.9*  MCV 94.2 94.8 96.5  PLT 257 193 190    Cardiac Enzymes: No results for input(s): CKTOTAL, CKMB, CKMBINDEX, TROPONINI in the last 168 hours.  BNP: Invalid input(s): POCBNP  CBG: Recent Labs  Lab 10/20/24 2040 10/21/24 0740 10/21/24 1155 10/21/24 1630 10/21/24 2008  GLUCAP 322* 274* 299* 333* 327*    Microbiology: Results for orders placed or performed during the hospital encounter of 10/19/24  Blood Culture (routine x 2)     Status: None (Preliminary  result)   Collection Time: 10/19/24 11:53 PM   Specimen: BLOOD  Result Value Ref Range Status   Specimen Description BLOOD RIGHT UPPER ARM  Final   Special Requests   Final    BOTTLES DRAWN AEROBIC AND ANAEROBIC Blood Culture results may not be optimal due to an inadequate volume of blood received in culture bottles   Culture   Final    NO GROWTH 2 DAYS Performed at [facility], [address], [place], [place] [address]    Report Status PENDING  Incomplete  Resp panel by RT-PCR (RSV, Flu A&B,  Covid) Anterior Nasal Swab     Status: None   Collection Time: 10/19/24 11:53 PM   Specimen: Anterior Nasal Swab  Result Value Ref Range Status   SARS Coronavirus 2 by RT PCR NEGATIVE NEGATIVE Final    Comment: (NOTE) SARS-CoV-2 target nucleic acids are NOT DETECTED.  The SARS-CoV-2 RNA is generally detectable in upper respiratory specimens during the acute phase of infection. The lowest concentration of SARS-CoV-2 viral copies this assay can detect is 138 copies/mL. A negative result does not preclude SARS-Cov-2 infection and should not be used as the sole basis for treatment or other patient management decisions. A negative result may occur with  improper specimen collection/handling, submission of specimen other than nasopharyngeal swab, presence of viral mutation(s) within the areas targeted by this assay, and inadequate number of viral copies(<138 copies/mL). A negative result must be combined with clinical observations, patient history, and epidemiological information. The expected result is Negative.  Fact Sheet for Patients:  bloggercourse.com  Fact Sheet for Healthcare Providers:  seriousbroker.it  This test is no t yet approved or cleared by the [place]  FDA and  has been authorized for detection and/or diagnosis of SARS-CoV-2 by FDA under an Emergency Use Authorization (EUA). This EUA will remain  in effect (meaning this test can be used) for the duration of the COVID-19 declaration under Section 564(b)(1) of the Act, 21 U.S.C.section 360bbb-3(b)(1), unless the authorization is terminated  or revoked sooner.       Influenza A by PCR NEGATIVE NEGATIVE Final   Influenza B by PCR NEGATIVE NEGATIVE Final    Comment: (NOTE) The Xpert Xpress SARS-CoV-2/FLU/RSV plus assay is intended as an aid in the diagnosis of influenza from Nasopharyngeal swab specimens and should not be used as a sole basis for treatment. Nasal  washings and aspirates are unacceptable for Xpert Xpress SARS-CoV-2/FLU/RSV testing.  Fact Sheet for Patients: bloggercourse.com  Fact Sheet for Healthcare Providers: seriousbroker.it  This test is not yet approved or cleared by the [place]  FDA and has been authorized for detection and/or diagnosis of SARS-CoV-2 by FDA under an Emergency Use Authorization (EUA). This EUA will remain in effect (meaning this test can be used) for the duration of the COVID-19 declaration under Section 564(b)(1) of the Act, 21 U.S.C. section 360bbb-3(b)(1), unless the authorization is terminated or revoked.     Resp Syncytial Virus by PCR NEGATIVE NEGATIVE Final    Comment: (NOTE) Fact Sheet for Patients: bloggercourse.com  Fact Sheet for Healthcare Providers: seriousbroker.it  This test is not yet approved or cleared by the [place]  FDA and has been authorized for detection and/or diagnosis of SARS-CoV-2 by FDA under an Emergency Use Authorization (EUA). This EUA will remain in effect (meaning this test can be used) for the duration of the COVID-19 declaration under Section 564(b)(1) of the Act, 21 U.S.C. section 360bbb-3(b)(1), unless the authorization is terminated or revoked.  Performed at [facility], [address], [place], [place] [address]   Blood Culture (routine x 2)     Status: None (Preliminary result)   Collection Time: 10/20/24 12:26 AM   Specimen: BLOOD  Result Value Ref Range Status   Specimen Description BLOOD LEFT UPPER ARM  Final   Special Requests   Final    BOTTLES DRAWN AEROBIC AND ANAEROBIC Blood Culture results may not be optimal due to an inadequate volume of blood received in culture bottles   Culture   Final    NO GROWTH 2 DAYS Performed at [facility], [address] [place], [place] [address]    Report Status PENDING  Incomplete   Urine Culture     Status: Abnormal   Collection Time: 10/20/24 12:58 AM   Specimen: Urine, Random  Result Value Ref Range Status   Specimen Description   Final    URINE, RANDOM Performed at [facility], [address], [place], [place] [address]    Special Requests   Final    NONE Reflexed from [id number] Performed at [facility], [address] [place], [place] [address]    Culture (A)  Final    >=100,000 COLONIES/mL PROTEUS MIRABILIS >=100,000 COLONIES/mL ENTEROCOCCUS FAECALIS VANCOMYCIN  RESISTANT ENTEROCOCCUS ISOLATED    Report Status 10/22/2024 FINAL  Final   Organism ID, Bacteria PROTEUS MIRABILIS (A)  Final   Organism ID, Bacteria ENTEROCOCCUS FAECALIS (A)  Final      Susceptibility   Enterococcus faecalis - MIC*    AMPICILLIN <=2 SENSITIVE Sensitive     NITROFURANTOIN <=16 SENSITIVE Sensitive     VANCOMYCIN  >=32 RESISTANT Resistant     LINEZOLID  2 SENSITIVE Sensitive     * >=100,000 COLONIES/mL ENTEROCOCCUS FAECALIS   Proteus mirabilis - MIC*    AMPICILLIN <=2 SENSITIVE Sensitive     CEFAZOLIN  (URINE) Value in next row Sensitive      4 SENSITIVEThis is a modified FDA-approved test that has been validated and its performance characteristics determined by the reporting laboratory.  This laboratory is certified under the Clinical Laboratory Improvement Amendments CLIA as qualified to perform high complexity clinical laboratory testing.    CEFEPIME  Value in next row Sensitive      4 SENSITIVEThis is a modified FDA-approved test that has been validated and its performance characteristics determined by the reporting laboratory.  This laboratory is certified under the Clinical Laboratory Improvement Amendments CLIA as qualified to perform high complexity clinical laboratory testing.    ERTAPENEM Value in next row Sensitive      4 SENSITIVEThis is a modified FDA-approved test that has been validated and its performance characteristics determined by the reporting  laboratory.  This laboratory is certified under the Clinical Laboratory Improvement Amendments CLIA as qualified to perform high complexity clinical laboratory testing.    CEFTRIAXONE  Value in next row Sensitive      4 SENSITIVEThis is a modified FDA-approved test that has been validated and its performance characteristics determined by the reporting laboratory.  This laboratory is certified under the Clinical Laboratory Improvement Amendments CLIA as qualified to perform high complexity clinical laboratory testing.    CIPROFLOXACIN  Value in next row Resistant      4 SENSITIVEThis is a modified FDA-approved test that has been validated and its performance characteristics determined by the reporting laboratory.  This laboratory is certified under the Clinical Laboratory Improvement Amendments CLIA as qualified to perform high complexity clinical laboratory testing.    GENTAMICIN Value in next  row Sensitive      4 SENSITIVEThis is a modified FDA-approved test that has been validated and its performance characteristics determined by the reporting laboratory.  This laboratory is certified under the Clinical Laboratory Improvement Amendments CLIA as qualified to perform high complexity clinical laboratory testing.    NITROFURANTOIN Value in next row Resistant      4 SENSITIVEThis is a modified FDA-approved test that has been validated and its performance characteristics determined by the reporting laboratory.  This laboratory is certified under the Clinical Laboratory Improvement Amendments CLIA as qualified to perform high complexity clinical laboratory testing.    TRIMETH /SULFA  Value in next row Sensitive      4 SENSITIVEThis is a modified FDA-approved test that has been validated and its performance characteristics determined by the reporting laboratory.  This laboratory is certified under the Clinical Laboratory Improvement Amendments CLIA as qualified to perform high complexity clinical laboratory testing.     AMPICILLIN/SULBACTAM Value in next row Sensitive      4 SENSITIVEThis is a modified FDA-approved test that has been validated and its performance characteristics determined by the reporting laboratory.  This laboratory is certified under the Clinical Laboratory Improvement Amendments CLIA as qualified to perform high complexity clinical laboratory testing.    PIP/TAZO Value in next row Sensitive      <=4 SENSITIVEThis is a modified FDA-approved test that has been validated and its performance characteristics determined by the reporting laboratory.  This laboratory is certified under the Clinical Laboratory Improvement Amendments CLIA as qualified to perform high complexity clinical laboratory testing.    MEROPENEM Value in next row Sensitive      <=4 SENSITIVEThis is a modified FDA-approved test that has been validated and its performance characteristics determined by the reporting laboratory.  This laboratory is certified under the Clinical Laboratory Improvement Amendments CLIA as qualified to perform high complexity clinical laboratory testing.    * >=100,000 COLONIES/mL PROTEUS MIRABILIS  Respiratory (~20 pathogens) panel by PCR     Status: None   Collection Time: 10/20/24  4:09 PM   Specimen: Nasopharyngeal Swab; Respiratory  Result Value Ref Range Status   Adenovirus NOT DETECTED NOT DETECTED Final   Coronavirus 229E NOT DETECTED NOT DETECTED Final    Comment: (NOTE) The Coronavirus on the Respiratory Panel, DOES NOT test for the novel  Coronavirus (2019 nCoV)    Coronavirus HKU1 NOT DETECTED NOT DETECTED Final   Coronavirus NL63 NOT DETECTED NOT DETECTED Final   Coronavirus OC43 NOT DETECTED NOT DETECTED Final   Metapneumovirus NOT DETECTED NOT DETECTED Final   Rhinovirus / Enterovirus NOT DETECTED NOT DETECTED Final   Influenza A NOT DETECTED NOT DETECTED Final   Influenza B NOT DETECTED NOT DETECTED Final   Parainfluenza Virus 1 NOT DETECTED NOT DETECTED Final   Parainfluenza  Virus 2 NOT DETECTED NOT DETECTED Final   Parainfluenza Virus 3 NOT DETECTED NOT DETECTED Final   Parainfluenza Virus 4 NOT DETECTED NOT DETECTED Final   Respiratory Syncytial Virus NOT DETECTED NOT DETECTED Final   Bordetella pertussis NOT DETECTED NOT DETECTED Final   Bordetella Parapertussis NOT DETECTED NOT DETECTED Final   Chlamydophila pneumoniae NOT DETECTED NOT DETECTED Final   Mycoplasma pneumoniae NOT DETECTED NOT DETECTED Final    Comment: Performed at [facility], [address] [place], [place] [address]    Coagulation Studies: Recent Labs    10/20/24 0025  LABPROT 13.2  INR 0.9     Urinalysis: Recent Labs    10/20/24  0058  COLORURINE YELLOW*  LABSPEC 1.024  PHURINE 7.0  GLUCOSEU NEGATIVE  HGBUR SMALL*  BILIRUBINUR NEGATIVE  KETONESUR NEGATIVE  PROTEINUR >=300*  NITRITE NEGATIVE  LEUKOCYTESUR LARGE*       Imaging: DG Abd 1 View Result Date: 10/21/2024 CLINICAL DATA:  Ileus. EXAM: ABDOMEN - 1 VIEW COMPARISON:  10/14/2024. FINDINGS: Air distended stomach. Dilated gas and stool-filled colon. Stool in the rectosigmoid colon. Visualized lung bases show bilateral pleural effusions. IMPRESSION: Colonic ileus with moderate to severe stool burden. Electronically Signed   By: Newell Eke M.D.   On: 10/21/2024 10:04     Medications:    doxycycline  (VIBRAMYCIN ) IV 125 mL/hr at 10/22/24 0303   piperacillin -tazobactam (ZOSYN )  IV     [START ON 10/23/2024] vancomycin       bisacodyl   10 mg Oral QHS   bisacodyl   10 mg Rectal Daily   calcitRIOL   0.5 mcg Oral Daily   calcium  acetate  667 mg Oral TID WC   carvedilol   6.25 mg Oral BID WC   Chlorhexidine  Gluconate Cloth  6 each Topical Q0600   diltiazem   240 mg Oral QPM   doxepin   10 mg Oral QHS   epoetin  alfa-epbx (RETACRIT ) injection  4,000 Units Intravenous Q M,W,F-1800   famotidine   10 mg Oral Daily   furosemide   60 mg Intravenous BID   gabapentin   300 mg Oral QHS   heparin   5,000 Units Subcutaneous  Q12H   insulin  aspart  0-9 Units Subcutaneous TID WC   insulin  glargine-yfgn  20 Units Subcutaneous Daily   lactulose   20 g Oral BID   levothyroxine   175 mcg Oral QHS   linaclotide   145 mcg Oral QAC breakfast   loratadine   10 mg Oral Daily   melatonin  10 mg Oral QHS   midodrine   20 mg Oral Once per day on Monday Wednesday Friday   mineral oil  1 enema Rectal QPM   polyethylene glycol  17 g Oral BID   predniSONE   5 mg Oral Q breakfast   senna-docusate  1 tablet Oral BID   simethicone   80 mg Oral QID   sodium chloride  flush  3 mL Intravenous Q12H   acetaminophen , bisacodyl , heparin , ipratropium-albuterol , metoCLOPramide  (REGLAN ) injection, ondansetron  (ZOFRAN ) IV, mouth rinse, oxyCODONE , sodium chloride  flush  Assessment/ Plan:  Eugene [name] is a 64 y.o.  male with past medical conditions including diabetes, hepatitis C, bilateral BKA, anxiety and depression, and end-stage renal disease on hemodialysis.  Patient presents to the emergency department from his facility complaining of shortness of breath.  Patient is currently admitted for CHF (congestive heart failure) (HCC) [I50.9] Acute respiratory failure with hypoxia (HCC) [J96.01] MRSA bacteremia [R78.81, B95.62] Hypervolemia, unspecified hypervolemia type [E87.70] Sepsis, due to unspecified organism, unspecified whether acute organ dysfunction present (HCC) [A41.9]  [facility]/MWF/120.5 kg  End-stage renal disease with hyperkalemia on hemodialysis.   Receiving treatment today, access functioning fair after cathflo dwell.  Did not receive treatment yesterday due to large volume of urgent cases. Next treatment scheduled for Friday, to maintain outpatient schedule.    Anemia of chronic kidney disease Lab Results  Component Value Date   HGB 7.8 (L) 10/22/2024   Hemoglobin 7.8, continue low-dose IV Retacrit  4000 units with dialysis.  3. Secondary Hyperparathyroidism: with outpatient labs: None available  Lab  Results  Component Value Date   PTH 177 (H) 04/23/2024   CALCIUM  7.6 (L) 10/22/2024   PHOS 7.6 (H) 10/22/2024   Hyperphosphatemia noted, continue calcium   acetate with meals.  Will continue to monitor.      LOS: 2 Eugene [name] 1/29/202611:51 AM   "

## 2024-10-22 NOTE — Plan of Care (Signed)
  Problem: Clinical Measurements: Goal: Cardiovascular complication will be avoided Outcome: Progressing   Problem: Nutrition: Goal: Adequate nutrition will be maintained Outcome: Progressing   Problem: Pain Managment: Goal: General experience of comfort will improve and/or be controlled Outcome: Progressing

## 2024-10-22 NOTE — Progress Notes (Signed)
 Patient accepted PRN Duoneb treatment, but declines Metaneb treatment at this time.

## 2024-10-22 NOTE — Progress Notes (Signed)
 PHARMACIST - PHYSICIAN COMMUNICATION DR:   TRH CONCERNING: Antibiotic IV to Oral Route Change Policy  RECOMMENDATION: This patient is receiving doxycyline by the intravenous route.  Based on criteria approved by the Pharmacy and Therapeutics Committee, the antibiotic(s) is/are being converted to the equivalent oral dose form(s).   DESCRIPTION: These criteria include: Patient being treated for a respiratory tract infection, urinary tract infection, cellulitis or clostridium difficile associated diarrhea if on metronidazole  The patient is not neutropenic and does not exhibit a GI malabsorption state The patient is eating (either orally or via tube) and/or has been taking other orally administered medications for a least 24 hours The patient is improving clinically and has a Tmax < 100.5  If you have questions about this conversion, please contact the Pharmacy Department  []   [phone] )  [facility] [x]   [phone] )  [facility] []   [phone] )  [facility] []   [phone] )  [facility] []   [phone] )  [facility]    Celestine Slovak, PharmD, [place], [place] Work Cell: [phone] 10/22/2024 2:26 PM

## 2024-10-23 ENCOUNTER — Inpatient Hospital Stay

## 2024-10-23 DIAGNOSIS — J9601 Acute respiratory failure with hypoxia: Secondary | ICD-10-CM | POA: Diagnosis not present

## 2024-10-23 LAB — CBC
HCT: 26.1 % — ABNORMAL LOW (ref 39.0–52.0)
Hemoglobin: 8 g/dL — ABNORMAL LOW (ref 13.0–17.0)
MCH: 29.5 pg (ref 26.0–34.0)
MCHC: 30.7 g/dL (ref 30.0–36.0)
MCV: 96.3 fL (ref 80.0–100.0)
Platelets: 203 10*3/uL (ref 150–400)
RBC: 2.71 MIL/uL — ABNORMAL LOW (ref 4.22–5.81)
RDW: 14.8 % (ref 11.5–15.5)
WBC: 7.5 10*3/uL (ref 4.0–10.5)
nRBC: 0 % (ref 0.0–0.2)

## 2024-10-23 LAB — BASIC METABOLIC PANEL WITH GFR
Anion gap: 8 (ref 5–15)
BUN: 48 mg/dL — ABNORMAL HIGH (ref 8–23)
CO2: 26 mmol/L (ref 22–32)
Calcium: 7.8 mg/dL — ABNORMAL LOW (ref 8.9–10.3)
Chloride: 94 mmol/L — ABNORMAL LOW (ref 98–111)
Creatinine, Ser: 4.51 mg/dL — ABNORMAL HIGH (ref 0.61–1.24)
GFR, Estimated: 14 mL/min — ABNORMAL LOW
Glucose, Bld: 282 mg/dL — ABNORMAL HIGH (ref 70–99)
Potassium: 5.4 mmol/L — ABNORMAL HIGH (ref 3.5–5.1)
Sodium: 128 mmol/L — ABNORMAL LOW (ref 135–145)

## 2024-10-23 LAB — GLUCOSE, CAPILLARY
Glucose-Capillary: 223 mg/dL — ABNORMAL HIGH (ref 70–99)
Glucose-Capillary: 231 mg/dL — ABNORMAL HIGH (ref 70–99)
Glucose-Capillary: 317 mg/dL — ABNORMAL HIGH (ref 70–99)
Glucose-Capillary: 249 mg/dL — ABNORMAL HIGH (ref 70–99)

## 2024-10-23 LAB — LEGIONELLA PNEUMOPHILA SEROGP 1 UR AG: L. pneumophila Serogp 1 Ur Ag: NEGATIVE

## 2024-10-23 LAB — BODY FLUID CELL COUNT WITH DIFFERENTIAL: Total Nucleated Cell Count, Fluid: 0 uL (ref 0–1000)

## 2024-10-23 LAB — VANCOMYCIN, RANDOM: Vancomycin Rm: 26 ug/mL

## 2024-10-23 LAB — C-REACTIVE PROTEIN: CRP: 2.8 mg/dL — ABNORMAL HIGH

## 2024-10-23 MED ORDER — VANCOMYCIN HCL IN DEXTROSE 1-5 GM/200ML-% IV SOLN
1000.0000 mg | INTRAVENOUS | Status: DC
  Administered 2024-10-26 – 2024-10-28 (×2): 1000 mg via INTRAVENOUS
  Filled 2024-10-23 (×4): qty 200

## 2024-10-23 MED ORDER — FLUTICASONE FUROATE-VILANTEROL 200-25 MCG/ACT IN AEPB
1.0000 | INHALATION_SPRAY | Freq: Every day | RESPIRATORY_TRACT | Status: DC
  Administered 2024-10-23 – 2024-10-29 (×7): 1 via RESPIRATORY_TRACT
  Filled 2024-10-23: qty 28

## 2024-10-23 MED ORDER — VANCOMYCIN HCL 750 MG/150ML IV SOLN
750.0000 mg | Freq: Once | INTRAVENOUS | Status: DC
  Filled 2024-10-23: qty 150

## 2024-10-23 MED ORDER — GUAIFENESIN ER 600 MG PO TB12
600.0000 mg | ORAL_TABLET | Freq: Two times a day (BID) | ORAL | Status: DC
  Administered 2024-10-23 – 2024-10-29 (×13): 600 mg via ORAL
  Filled 2024-10-23 (×13): qty 1

## 2024-10-23 MED ORDER — IPRATROPIUM-ALBUTEROL 0.5-2.5 (3) MG/3ML IN SOLN
3.0000 mL | RESPIRATORY_TRACT | Status: DC | PRN
  Administered 2024-10-23 – 2024-10-27 (×4): 3 mL via RESPIRATORY_TRACT
  Filled 2024-10-23 (×3): qty 3

## 2024-10-23 MED ORDER — LIDOCAINE HCL (PF) 1 % IJ SOLN
10.0000 mL | Freq: Once | INTRAMUSCULAR | Status: AC
  Administered 2024-10-23: 10 mL via INTRADERMAL

## 2024-10-23 MED ORDER — UMECLIDINIUM BROMIDE 62.5 MCG/ACT IN AEPB
1.0000 | INHALATION_SPRAY | Freq: Every day | RESPIRATORY_TRACT | Status: DC
  Administered 2024-10-23 – 2024-10-29 (×7): 1 via RESPIRATORY_TRACT
  Filled 2024-10-23 (×2): qty 7

## 2024-10-23 MED ORDER — CALCIUM ACETATE (PHOS BINDER) 667 MG PO CAPS
1334.0000 mg | ORAL_CAPSULE | Freq: Three times a day (TID) | ORAL | Status: DC
  Administered 2024-10-23 – 2024-10-29 (×14): 1334 mg via ORAL
  Filled 2024-10-23 (×15): qty 2

## 2024-10-23 NOTE — Plan of Care (Signed)

## 2024-10-23 NOTE — Progress Notes (Signed)
 [facility] Progress Note  Patient: Eugene [name]    FMW:[id number]  DOA: 10/19/2024     Date of Service: the patient was seen and examined on 10/23/2024  Chief Complaint  Patient presents with   Shortness of Breath   Brief hospital course: Torie Priebe is a 64 y.o. male with medical history significant of recently diagnosed MRSA bacteremia on vancomycin , ESRD on HD MWF, chronic hypoxic respiratory failure on 6 L continuously, bilateral BKA, chronic HFpEF, IDDM, HTN, HLD<hypothyroidism, chronic pain syndrome, obesity, presented with worsening of cough and hypoxia.   Patient was recently hospitalized for sepsis when he was diagnosed with MRSA bacteremia, endocarditis workup negative.  Subsequently patient was discharged home on vancomycin  Monday Wednesday Friday.  Last 2 days patient developed a dry cough with increasing hypoxia, this morning patient was found to be hypoxic and was placed on 15 L.  Patient denies any chest pain, no nausea vomiting no diarrhea.  He still makes urine but denied any dysuria no back pains. ED Course: Temperature 100.9, blood pressure 140/80 O2 saturation 89% on 10 L and stabilized on NRB but eventually stabilized on 6 L.  Chest x-ray showed bilateral pulmonary congestion.  Blood work showed WBC 10.8 hemoglobin 9.1 BUN 48 creatinine 5.6 bicarb 25, VBG 7.30 2/56/61.   Patient was given vancomycin  and cefepime  in the ED.  Nephrology was consulted for emergency HD.   Assessment and Plan:  # Acute on chronic hypoxic respiratory failure # Acute on chronic HFpEF decompensation -Stabilized - Secondary to fluid overload - Emergency HD this morning - Other DDx, patient has a concurrent low-grade fever, chest x-ray showed bilateral infiltrates most compatible with pulmonary congestion.  Atypical pneumonia cannot be ruled out, will check atypical pneumonia study Legionella and mycoplasma.  For now we will cover him with doxycycline .  Respiratory viral panel  negative Pulmonary consult 1/29 CT chest: Improving pulmonary edema, bilateral lower lobe atelectasis, pleural effusion right mild, moderate left effusion possible loculated empyema. 1/30 IR consulted for thoracentesis, Follow fluid studies  # UTI Urine culture growing VRE E and Proteus, follow sensitivity Continue Vanco,  pharmacy consulted for dosing and trough monitoring  S/p cefepime  1/29 discontinued cefepime  and started Zosyn  to cover VRE    # Chronic constipation 1/28 KUB: Colonic ileus with moderate to severe stool burden  Last time patient had several situation and he refused NG tube and n.p.o. status.  Continue laxatives orally, use Dulcolax suppository and enema. 1/29 CT A/P: Large volume stool within the rectosigmoid colon, mild stercoral colitis, moderate stool throughout. 1/29 s/p mineral oil enema today, starting smog enema daily from 1/30 for 3 days  # Hyperkalemia: Resolved - Urgency dialysis   Recently diagnosed MRSA bacteremia - Continue vancomycin  Monday Wednesday Friday, last dose will be November 11, 2024.   IDDM with hyperglycemia - Continue Lantus  and SSI   ESRD on HD - As above   Paroxysmal SVT - In sinus rhythm, continue Cardizem  and Coreg    COPD - No signs of acute COPD exacerbation - Continue as needed breathing meds   Chronic pain syndrome - As needed oxycodone    Obesity class II - BMI= 39 - Calorie control recommended     Body mass index is 39.45 kg/m.  Interventions:  Wound 09/29/24 1832 Pressure Injury Buttocks Right;Left Deep Tissue Pressure Injury - Purple or maroon localized area of discolored intact skin or blood-filled blister due to damage of underlying soft tissue from pressure and/or shear. (Active)     Diet: Renal/carb  modified diet DVT Prophylaxis: Subcutaneous Heparin     Advance goals of care discussion: DNR-limited  Family Communication: family was not present at bedside, at the time of interview.  The pt  provided permission to discuss medical plan with the family. Opportunity was given to ask question and all questions were answered satisfactorily.   Disposition:  Pt is from SNF [facility], admitted with respiratory failure, pneumonia and UTI, still on antibiotics and has respiratory distress, which precludes a safe discharge. Discharge to SNF back to [facility], when stable, most likely in few days.  Subjective: No significant events overnight.  Patient does not feel good, feels that he is having more difficulty breathing, patient was requesting for additional nebulizer treatment,.  Patient agreed for thoracentesis.  Patient was advised to get enema done as well.   Physical Exam: General: mild - mod respiratory distress Affect depressed  Eyes: PERRLA ENT: Oral Mucosa Clear, moist  Neck: no JVD,  Cardiovascular: S1 and S2 Present, no Murmur,  Respiratory: good air entry bilaterally, bibasilar crackles, no wheezes. Abdomen: BS present, Soft, but obese and distended, no tenderness,  Skin: no rashes Extremities: s/p  b/l BKA Neurologic: without any new focal findings Gait not checked due to patient safety concerns  Vitals:   10/23/24 1229 10/23/24 1443 10/23/24 1526 10/23/24 1654  BP: (!) 148/66  (!) 155/72 (!) 144/72  Pulse: 62  69 66  Resp: 18   18  Temp: 98.3 F (36.8 C)   98.1 F (36.7 C)  TempSrc: Oral   Oral  SpO2: 90% 93% 92%   Weight:        Intake/Output Summary (Last 24 hours) at 10/23/2024 1658 Last data filed at 10/23/2024 1521 Gross per 24 hour  Intake 320 ml  Output 350 ml  Net -30 ml   Filed Weights   10/22/24 0754 10/22/24 1311 10/23/24 0523  Weight: 123.6 kg 121.8 kg 124.7 kg    Data Reviewed: I have personally reviewed and interpreted daily labs, tele strips, imagings as discussed above. I reviewed all nursing notes, pharmacy notes, vitals, pertinent old records I have discussed plan of care as described above with RN and patient/family.  CBC: Recent Labs   Lab 10/20/24 0025 10/21/24 0450 10/22/24 0950 10/23/24 0302  WBC 10.8* 6.8 7.2 7.5  NEUTROABS 6.9  --   --   --   HGB 9.1* 7.9* 7.8* 8.0*  HCT 29.3* 25.7* 24.9* 26.1*  MCV 94.2 94.8 96.5 96.3  PLT 257 193 190 203   Basic Metabolic Panel: Recent Labs  Lab 10/20/24 0025 10/20/24 2054 10/21/24 0450 10/22/24 0950 10/23/24 0302  NA 132* 130* 131* 131* 128*  K 5.9* 5.6* 5.1 5.8* 5.4*  CL 94* 94* 94* 94* 94*  CO2 25 25 26 26 26   GLUCOSE 232* 350* 312* 310* 282*  BUN 48* 55* 57* 65* 48*  CREATININE 5.69* 5.84* 5.87* 5.97* 4.51*  CALCIUM  8.4* 7.8* 7.8* 7.6* 7.8*  MG  --   --   --  2.0  --   PHOS  --   --   --  7.6*  --     Studies: DG Chest Port 1 View Result Date: 10/23/2024 EXAM: 1 VIEW(S) XRAY OF THE CHEST 10/23/2024 04:21:00 PM COMPARISON: 10/19/2024 CLINICAL HISTORY: Pleural effusion on left. Status post thoracentesis. ICD [phone] Pleural effusion on left; ICD [id number] Status post thoracentesis. FINDINGS: LINES, TUBES AND DEVICES: Stable left IJ CVC (internal jugular central venous catheter) with tip projecting over the confluence of  the left brachiocephalic vein and SVC (superior vena cava), tip directed towards the lateral wall. LUNGS AND PLEURA: Diffuse interstitial opacities. Stable moderate left pleural effusion. Trace right pleural effusion. No pneumothorax. HEART AND MEDIASTINUM: Persistent cardiomegaly. No acute abnormality of the mediastinal silhouette. BONES AND SOFT TISSUES: Lower thoracic spine fixation hardware noted. No acute osseous abnormality. IMPRESSION: 1. Stable moderate left pleural effusion and trace right pleural effusion. 2. Diffuse interstitial opacities. 3. Persistent cardiomegaly. Electronically signed by: Dayne Hassell MD 10/23/2024 04:48 PM EST RP Workstation: [id number]    Scheduled Meds:  bisacodyl   10 mg Oral QHS   bisacodyl   10 mg Rectal Daily   calcitRIOL   0.5 mcg Oral Daily   calcium  acetate  1,334 mg Oral TID WC   carvedilol   6.25 mg Oral BID WC    Chlorhexidine  Gluconate Cloth  6 each Topical Q0600   diltiazem   240 mg Oral QPM   doxepin   10 mg Oral QHS   doxycycline   100 mg Oral Q12H   epoetin  alfa-epbx (RETACRIT ) injection  4,000 Units Intravenous Q M,W,F-1800   famotidine   10 mg Oral Daily   fluticasone  furoate-vilanterol  1 puff Inhalation Daily   furosemide   60 mg Intravenous BID   gabapentin   300 mg Oral QHS   guaiFENesin   600 mg Oral BID   heparin   5,000 Units Subcutaneous Q12H   insulin  aspart  0-9 Units Subcutaneous TID WC   insulin  glargine-yfgn  20 Units Subcutaneous BID   lactulose   20 g Oral BID   levothyroxine   175 mcg Oral QHS   linaclotide   145 mcg Oral QAC breakfast   loratadine   10 mg Oral Daily   melatonin  10 mg Oral QHS   midodrine   20 mg Oral Once per day on Monday Wednesday Friday   polyethylene glycol  17 g Oral BID   predniSONE   5 mg Oral Q breakfast   senna-docusate  1 tablet Oral BID   simethicone   80 mg Oral QID   sodium chloride  flush  3 mL Intravenous Q12H   SMOG  960 mL Rectal Daily   umeclidinium bromide   1 puff Inhalation Daily   Continuous Infusions:  piperacillin -tazobactam (ZOSYN )  IV Stopped (10/23/24 1352)   [START ON 10/26/2024] vancomycin      vancomycin      PRN Meds: acetaminophen , bisacodyl , ipratropium-albuterol , metoCLOPramide  (REGLAN ) injection, ondansetron  (ZOFRAN ) IV, mouth rinse, oxyCODONE , sodium chloride  flush  Time spent: 55 minutes  Author: ELVAN SOR. MD [facility] 10/23/2024 4:58 PM  To reach On-call, see care teams to locate the attending and reach out to them via [email or web address]. If 7PM-7AM, please contact night-coverage If you still have difficulty reaching the attending provider, please page the [facility] (Director on Call) for [facility] on amion for assistance.

## 2024-10-23 NOTE — Progress Notes (Signed)
 Pt requested another nebulizer at 1430 still refusing metaneb and cPap.  Text pulmonary dr which he discontinued metaneb ordered HHFNC and breo and incruse inhalers.  Pt was in no respiratory distress

## 2024-10-23 NOTE — Procedures (Signed)
 PROCEDURE SUMMARY:  Successful US  guided left sided thoracentesis. Yielded 15mL of clear yellow fluid. Patient tolerated procedure well. No immediate complications. EBL = trace  Specimen sent for labs.  Post procedure chest X-ray reveals no pneumothorax  Caprice Mccaffrey CHRISTELLA Bal PA-C 10/23/2024 4:53 PM

## 2024-10-23 NOTE — Progress Notes (Signed)
 Pt requested a PRN tx at 1025  pt remain on 10L HFNC refusing metaneb.  Will do flutter valve with pt  pt was in no distress at the time he requested a treatment

## 2024-10-23 NOTE — Progress Notes (Signed)
 " [facility]  ROUNDING NOTE   Subjective:   Eugene [name] is a 64 year old male with past medical conditions including diabetes, hepatitis C, bilateral BKA, anxiety and depression, and end-stage renal disease on hemodialysis.  Patient presents to the emergency department from his facility complaining of shortness of breath.  Patient is currently admitted for CHF (congestive heart failure) (HCC) [I50.9] Acute respiratory failure with hypoxia (HCC) [J96.01] MRSA bacteremia [R78.81, B95.62] Hypervolemia, unspecified hypervolemia type [E87.70] Sepsis, due to unspecified organism, unspecified whether acute organ dysfunction present [facility]) [A41.9]  Patient is known to our practice and receives outpatient dialysis treatments at [facility] on a MWF schedule, supervised by Dr. Dennise.    Update: Patient sitting up in bed Breakfast tray partially consumed Remains 10 HFNC   Objective:  Vital signs in last 24 hours:  Temp:  [97.8 F (36.6 C)-98.3 F (36.8 C)] 98.3 F (36.8 C) (01/30 1229) Pulse Rate:  [62-73] 62 (01/30 1229) Resp:  [13-20] 18 (01/30 1229) BP: (108-150)/(66-85) 148/66 (01/30 1229) SpO2:  [90 %-100 %] 90 % (01/30 1229) Weight:  [121.8 kg-124.7 kg] 124.7 kg (01/30 0523)  Weight change:  Filed Weights   10/22/24 0754 10/22/24 1311 10/23/24 0523  Weight: 123.6 kg 121.8 kg 124.7 kg     Intake/Output: I/O last 3 completed shifts: In: 961.2 [IV Piggyback:961.2] Out: 3550 [Urine:1650; Other:1900]   Intake/Output this shift:  No intake/output data recorded.  Physical Exam: General: NAD  Head: Normocephalic  Eyes: Anicteric  Lungs:  Coarse, 10 L HFNC  Heart: Regular rate and rhythm  Abdomen:  Soft, nontender  Extremities: No edema.  Bilateral BKA  Neurologic: Awake, alert, conversant  Skin: Warm,dry, no rash  Access: Lt internal jugular permcath    Basic Metabolic Panel: Recent Labs  Lab 10/20/24 0025 10/20/24 2054 10/21/24 0450  10/22/24 0950 10/23/24 0302  NA 132* 130* 131* 131* 128*  K 5.9* 5.6* 5.1 5.8* 5.4*  CL 94* 94* 94* 94* 94*  CO2 25 25 26 26 26   GLUCOSE 232* 350* 312* 310* 282*  BUN 48* 55* 57* 65* 48*  CREATININE 5.69* 5.84* 5.87* 5.97* 4.51*  CALCIUM  8.4* 7.8* 7.8* 7.6* 7.8*  MG  --   --   --  2.0  --   PHOS  --   --   --  7.6*  --     Liver Function Tests: Recent Labs  Lab 10/20/24 0025  AST 27  ALT 17  ALKPHOS 81  BILITOT 0.4  PROT 8.1  ALBUMIN  3.2*   No results for input(s): LIPASE, AMYLASE in the last 168 hours.  No results for input(s): AMMONIA in the last 168 hours.  CBC: Recent Labs  Lab 10/20/24 0025 10/21/24 0450 10/22/24 0950 10/23/24 0302  WBC 10.8* 6.8 7.2 7.5  NEUTROABS 6.9  --   --   --   HGB 9.1* 7.9* 7.8* 8.0*  HCT 29.3* 25.7* 24.9* 26.1*  MCV 94.2 94.8 96.5 96.3  PLT 257 193 190 203    Cardiac Enzymes: No results for input(s): CKTOTAL, CKMB, CKMBINDEX, TROPONINI in the last 168 hours.  BNP: Invalid input(s): POCBNP  CBG: Recent Labs  Lab 10/21/24 2008 10/22/24 1338 10/22/24 1656 10/23/24 0832 10/23/24 1227  GLUCAP 327* 273* 384* 223* 231*    Microbiology: Results for orders placed or performed during the hospital encounter of 10/19/24  Blood Culture (routine x 2)     Status: None (Preliminary result)   Collection Time: 10/19/24 11:53 PM   Specimen: BLOOD  Result Value Ref Range Status   Specimen Description BLOOD RIGHT UPPER ARM  Final   Special Requests   Final    BOTTLES DRAWN AEROBIC AND ANAEROBIC Blood Culture results may not be optimal due to an inadequate volume of blood received in culture bottles   Culture   Final    NO GROWTH 3 DAYS Performed at [facility], [address] [place], [place] [address]    Report Status PENDING  Incomplete  Resp panel by RT-PCR (RSV, Flu A&B, Covid) Anterior Nasal Swab     Status: None   Collection Time: 10/19/24 11:53 PM   Specimen: Anterior Nasal Swab  Result Value  Ref Range Status   SARS Coronavirus 2 by RT PCR NEGATIVE NEGATIVE Final    Comment: (NOTE) SARS-CoV-2 target nucleic acids are NOT DETECTED.  The SARS-CoV-2 RNA is generally detectable in upper respiratory specimens during the acute phase of infection. The lowest concentration of SARS-CoV-2 viral copies this assay can detect is 138 copies/mL. A negative result does not preclude SARS-Cov-2 infection and should not be used as the sole basis for treatment or other patient management decisions. A negative result may occur with  improper specimen collection/handling, submission of specimen other than nasopharyngeal swab, presence of viral mutation(s) within the areas targeted by this assay, and inadequate number of viral copies(<138 copies/mL). A negative result must be combined with clinical observations, patient history, and epidemiological information. The expected result is Negative.  Fact Sheet for Patients:  bloggercourse.com  Fact Sheet for Healthcare Providers:  seriousbroker.it  This test is no t yet approved or cleared by the [place]  FDA and  has been authorized for detection and/or diagnosis of SARS-CoV-2 by FDA under an Emergency Use Authorization (EUA). This EUA will remain  in effect (meaning this test can be used) for the duration of the COVID-19 declaration under Section 564(b)(1) of the Act, 21 U.S.C.section 360bbb-3(b)(1), unless the authorization is terminated  or revoked sooner.       Influenza A by PCR NEGATIVE NEGATIVE Final   Influenza B by PCR NEGATIVE NEGATIVE Final    Comment: (NOTE) The Xpert Xpress SARS-CoV-2/FLU/RSV plus assay is intended as an aid in the diagnosis of influenza from Nasopharyngeal swab specimens and should not be used as a sole basis for treatment. Nasal washings and aspirates are unacceptable for Xpert Xpress SARS-CoV-2/FLU/RSV testing.  Fact Sheet for  Patients: bloggercourse.com  Fact Sheet for Healthcare Providers: seriousbroker.it  This test is not yet approved or cleared by the [place]  FDA and has been authorized for detection and/or diagnosis of SARS-CoV-2 by FDA under an Emergency Use Authorization (EUA). This EUA will remain in effect (meaning this test can be used) for the duration of the COVID-19 declaration under Section 564(b)(1) of the Act, 21 U.S.C. section 360bbb-3(b)(1), unless the authorization is terminated or revoked.     Resp Syncytial Virus by PCR NEGATIVE NEGATIVE Final    Comment: (NOTE) Fact Sheet for Patients: bloggercourse.com  Fact Sheet for Healthcare Providers: seriousbroker.it  This test is not yet approved or cleared by the [place]  FDA and has been authorized for detection and/or diagnosis of SARS-CoV-2 by FDA under an Emergency Use Authorization (EUA). This EUA will remain in effect (meaning this test can be used) for the duration of the COVID-19 declaration under Section 564(b)(1) of the Act, 21 U.S.C. section 360bbb-3(b)(1), unless the authorization is terminated or revoked.  Performed at [facility], [address], [place], [place] [address]  Blood Culture (routine x 2)     Status: None (Preliminary result)   Collection Time: 10/20/24 12:26 AM   Specimen: BLOOD  Result Value Ref Range Status   Specimen Description BLOOD LEFT UPPER ARM  Final   Special Requests   Final    BOTTLES DRAWN AEROBIC AND ANAEROBIC Blood Culture results may not be optimal due to an inadequate volume of blood received in culture bottles   Culture   Final    NO GROWTH 3 DAYS Performed at [facility], [address], [place], [place] [address]    Report Status PENDING  Incomplete  Urine Culture     Status: Abnormal   Collection Time: 10/20/24 12:58 AM   Specimen: Urine, Random   Result Value Ref Range Status   Specimen Description   Final    URINE, RANDOM Performed at [facility], [address], [place], [place] [address]    Special Requests   Final    NONE Reflexed from [id number] Performed at [facility], [address] [place], [place] [address]    Culture (A)  Final    >=100,000 COLONIES/mL PROTEUS MIRABILIS >=100,000 COLONIES/mL ENTEROCOCCUS FAECALIS VANCOMYCIN  RESISTANT ENTEROCOCCUS ISOLATED    Report Status 10/22/2024 FINAL  Final   Organism ID, Bacteria PROTEUS MIRABILIS (A)  Final   Organism ID, Bacteria ENTEROCOCCUS FAECALIS (A)  Final      Susceptibility   Enterococcus faecalis - MIC*    AMPICILLIN <=2 SENSITIVE Sensitive     NITROFURANTOIN <=16 SENSITIVE Sensitive     VANCOMYCIN  >=32 RESISTANT Resistant     LINEZOLID  2 SENSITIVE Sensitive     * >=100,000 COLONIES/mL ENTEROCOCCUS FAECALIS   Proteus mirabilis - MIC*    AMPICILLIN <=2 SENSITIVE Sensitive     CEFAZOLIN  (URINE) Value in next row Sensitive      4 SENSITIVEThis is a modified FDA-approved test that has been validated and its performance characteristics determined by the reporting laboratory.  This laboratory is certified under the Clinical Laboratory Improvement Amendments CLIA as qualified to perform high complexity clinical laboratory testing.    CEFEPIME  Value in next row Sensitive      4 SENSITIVEThis is a modified FDA-approved test that has been validated and its performance characteristics determined by the reporting laboratory.  This laboratory is certified under the Clinical Laboratory Improvement Amendments CLIA as qualified to perform high complexity clinical laboratory testing.    ERTAPENEM Value in next row Sensitive      4 SENSITIVEThis is a modified FDA-approved test that has been validated and its performance characteristics determined by the reporting laboratory.  This laboratory is certified under the Clinical Laboratory Improvement Amendments CLIA as  qualified to perform high complexity clinical laboratory testing.    CEFTRIAXONE  Value in next row Sensitive      4 SENSITIVEThis is a modified FDA-approved test that has been validated and its performance characteristics determined by the reporting laboratory.  This laboratory is certified under the Clinical Laboratory Improvement Amendments CLIA as qualified to perform high complexity clinical laboratory testing.    CIPROFLOXACIN  Value in next row Resistant      4 SENSITIVEThis is a modified FDA-approved test that has been validated and its performance characteristics determined by the reporting laboratory.  This laboratory is certified under the Clinical Laboratory Improvement Amendments CLIA as qualified to perform high complexity clinical laboratory testing.    GENTAMICIN Value in next row Sensitive      4 SENSITIVEThis is a modified FDA-approved test  that has been validated and its performance characteristics determined by the reporting laboratory.  This laboratory is certified under the Clinical Laboratory Improvement Amendments CLIA as qualified to perform high complexity clinical laboratory testing.    NITROFURANTOIN Value in next row Resistant      4 SENSITIVEThis is a modified FDA-approved test that has been validated and its performance characteristics determined by the reporting laboratory.  This laboratory is certified under the Clinical Laboratory Improvement Amendments CLIA as qualified to perform high complexity clinical laboratory testing.    TRIMETH /SULFA  Value in next row Sensitive      4 SENSITIVEThis is a modified FDA-approved test that has been validated and its performance characteristics determined by the reporting laboratory.  This laboratory is certified under the Clinical Laboratory Improvement Amendments CLIA as qualified to perform high complexity clinical laboratory testing.    AMPICILLIN/SULBACTAM Value in next row Sensitive      4 SENSITIVEThis is a modified FDA-approved  test that has been validated and its performance characteristics determined by the reporting laboratory.  This laboratory is certified under the Clinical Laboratory Improvement Amendments CLIA as qualified to perform high complexity clinical laboratory testing.    PIP/TAZO Value in next row Sensitive      <=4 SENSITIVEThis is a modified FDA-approved test that has been validated and its performance characteristics determined by the reporting laboratory.  This laboratory is certified under the Clinical Laboratory Improvement Amendments CLIA as qualified to perform high complexity clinical laboratory testing.    MEROPENEM Value in next row Sensitive      <=4 SENSITIVEThis is a modified FDA-approved test that has been validated and its performance characteristics determined by the reporting laboratory.  This laboratory is certified under the Clinical Laboratory Improvement Amendments CLIA as qualified to perform high complexity clinical laboratory testing.    * >=100,000 COLONIES/mL PROTEUS MIRABILIS  Respiratory (~20 pathogens) panel by PCR     Status: None   Collection Time: 10/20/24  4:09 PM   Specimen: Nasopharyngeal Swab; Respiratory  Result Value Ref Range Status   Adenovirus NOT DETECTED NOT DETECTED Final   Coronavirus 229E NOT DETECTED NOT DETECTED Final    Comment: (NOTE) The Coronavirus on the Respiratory Panel, DOES NOT test for the novel  Coronavirus (2019 nCoV)    Coronavirus HKU1 NOT DETECTED NOT DETECTED Final   Coronavirus NL63 NOT DETECTED NOT DETECTED Final   Coronavirus OC43 NOT DETECTED NOT DETECTED Final   Metapneumovirus NOT DETECTED NOT DETECTED Final   Rhinovirus / Enterovirus NOT DETECTED NOT DETECTED Final   Influenza A NOT DETECTED NOT DETECTED Final   Influenza B NOT DETECTED NOT DETECTED Final   Parainfluenza Virus 1 NOT DETECTED NOT DETECTED Final   Parainfluenza Virus 2 NOT DETECTED NOT DETECTED Final   Parainfluenza Virus 3 NOT DETECTED NOT DETECTED Final    Parainfluenza Virus 4 NOT DETECTED NOT DETECTED Final   Respiratory Syncytial Virus NOT DETECTED NOT DETECTED Final   Bordetella pertussis NOT DETECTED NOT DETECTED Final   Bordetella Parapertussis NOT DETECTED NOT DETECTED Final   Chlamydophila pneumoniae NOT DETECTED NOT DETECTED Final   Mycoplasma pneumoniae NOT DETECTED NOT DETECTED Final    Comment: Performed at [facility], [address] [place], [place] [address]    Coagulation Studies: No results for input(s): LABPROT, INR in the last 72 hours.    Urinalysis: No results for input(s): COLORURINE, LABSPEC, PHURINE, GLUCOSEU, HGBUR, BILIRUBINUR, KETONESUR, PROTEINUR, UROBILINOGEN, NITRITE, LEUKOCYTESUR in the last 72 hours.  Invalid input(s):  APPERANCEUR      Imaging: CT CHEST WO CONTRAST Result Date: 10/22/2024 CLINICAL DATA:  Respiratory illness and bowel obstruction EXAM: CT CHEST, ABDOMEN AND PELVIS WITHOUT CONTRAST TECHNIQUE: Multidetector CT imaging of the chest, abdomen and pelvis was performed following the standard protocol without IV contrast. RADIATION DOSE REDUCTION: This exam was performed according to the departmental dose-optimization program which includes automated exposure control, adjustment of the mA and/or kV according to patient size and/or use of iterative reconstruction technique. COMPARISON:  CT abdomen and pelvis dated 09/30/2024, CTA chest dated 10/06/2024 FINDINGS: CT CHEST FINDINGS Cardiovascular: Left IJ central venous catheter terminates in the upper SVC. Multichamber cardiomegaly. No significant pericardial fluid/thickening. Dilated main pulmonary artery measures 4.0 cm. Coronary artery calcifications. Mediastinum/Nodes: Imaged thyroid gland without nodules meeting criteria for imaging follow-up by size. Normal esophagus. 10 mm AP window lymph node (2:24). Lungs/Pleura: The central airways are patent. Adherent secretions within the lower trachea extending into right  main bronchus. Mild diffuse bronchial wall thickening. Decreased geographic ground-glass opacities with persistent mild interlobular septal thickening. Increased near-complete round atelectasis of the bilateral lower lobes and increased subsegmental atelectasis of the lingula. No pneumothorax. Unchanged small right and moderate left pleural effusions with associated pleural thickening. Musculoskeletal: No acute or abnormal lytic or blastic osseous lesions. Thoracic spinal fusion hardware spanning T5-T11 appears intact. Unchanged chronic compression fracture of T8 and superior endplate compression deformity of T12. CT ABDOMEN PELVIS FINDINGS Hepatobiliary: No focal hepatic lesions. No intra or extrahepatic biliary ductal dilation. Normal gallbladder. Pancreas: No focal lesions or main ductal dilation. Spleen: Normal in size without focal abnormality. Adrenals/Urinary Tract: No adrenal nodules. No suspicious renal mass, calculi, or hydronephrosis. Urinary bladder is decompressed with suprapubic catheter in-situ. Stomach/Bowel: Normal appearance of the stomach. No evidence of bowel wall thickening, distention, or inflammatory changes. Mild circumferential mural thickening of the rectum. Large volume stool within the rectosigmoid colon. Moderate volume stool throughout the remainder of the colon. The sigmoid colon is deviated into the right lower quadrant. Normal appendix. Vascular/Lymphatic: No significant vascular findings are present. Unchanged 10 mm left para-aortic lymph node (2: 82). Reproductive: Prostate is unremarkable. Other: No free fluid, fluid collection, or free air. Musculoskeletal: No acute or abnormal lytic or blastic osseous findings. Small fat-containing paraumbilical hernia. Unchanged L1 anterior wedging. IMPRESSION: CT CHEST: 1. Improving pulmonary edema. 2. Increased near-complete round atelectasis of the bilateral lower lobes and increased subsegmental atelectasis of the lingula. 3. Unchanged  small right and moderate left pleural effusions with associated pleural thickening, which may reflect chronic loculation. Recommend correlation with results of recent thoracentesis for evaluation of empyema. 4. Dilated main pulmonary artery measures 4.0 cm, which can be seen in the setting of pulmonary arterial hypertension. 5. A 10 mm AP window lymph node, likely reactive. CT ABDOMEN AND PELVIS: 1. Large volume stool within the rectosigmoid colon with mild circumferential mural thickening of the rectum, which may reflect stercoral colitis. Moderate volume stool throughout the remainder of the colon. 2. Small fat-containing paraumbilical hernia. Electronically Signed   By: Limin  Xu M.D.   On: 10/22/2024 17:13   CT ABDOMEN PELVIS WO CONTRAST Result Date: 10/22/2024 CLINICAL DATA:  Respiratory illness and bowel obstruction EXAM: CT CHEST, ABDOMEN AND PELVIS WITHOUT CONTRAST TECHNIQUE: Multidetector CT imaging of the chest, abdomen and pelvis was performed following the standard protocol without IV contrast. RADIATION DOSE REDUCTION: This exam was performed according to the departmental dose-optimization program which includes automated exposure control, adjustment of the mA and/or kV according to  patient size and/or use of iterative reconstruction technique. COMPARISON:  CT abdomen and pelvis dated 09/30/2024, CTA chest dated 10/06/2024 FINDINGS: CT CHEST FINDINGS Cardiovascular: Left IJ central venous catheter terminates in the upper SVC. Multichamber cardiomegaly. No significant pericardial fluid/thickening. Dilated main pulmonary artery measures 4.0 cm. Coronary artery calcifications. Mediastinum/Nodes: Imaged thyroid gland without nodules meeting criteria for imaging follow-up by size. Normal esophagus. 10 mm AP window lymph node (2:24). Lungs/Pleura: The central airways are patent. Adherent secretions within the lower trachea extending into right main bronchus. Mild diffuse bronchial wall thickening.  Decreased geographic ground-glass opacities with persistent mild interlobular septal thickening. Increased near-complete round atelectasis of the bilateral lower lobes and increased subsegmental atelectasis of the lingula. No pneumothorax. Unchanged small right and moderate left pleural effusions with associated pleural thickening. Musculoskeletal: No acute or abnormal lytic or blastic osseous lesions. Thoracic spinal fusion hardware spanning T5-T11 appears intact. Unchanged chronic compression fracture of T8 and superior endplate compression deformity of T12. CT ABDOMEN PELVIS FINDINGS Hepatobiliary: No focal hepatic lesions. No intra or extrahepatic biliary ductal dilation. Normal gallbladder. Pancreas: No focal lesions or main ductal dilation. Spleen: Normal in size without focal abnormality. Adrenals/Urinary Tract: No adrenal nodules. No suspicious renal mass, calculi, or hydronephrosis. Urinary bladder is decompressed with suprapubic catheter in-situ. Stomach/Bowel: Normal appearance of the stomach. No evidence of bowel wall thickening, distention, or inflammatory changes. Mild circumferential mural thickening of the rectum. Large volume stool within the rectosigmoid colon. Moderate volume stool throughout the remainder of the colon. The sigmoid colon is deviated into the right lower quadrant. Normal appendix. Vascular/Lymphatic: No significant vascular findings are present. Unchanged 10 mm left para-aortic lymph node (2: 82). Reproductive: Prostate is unremarkable. Other: No free fluid, fluid collection, or free air. Musculoskeletal: No acute or abnormal lytic or blastic osseous findings. Small fat-containing paraumbilical hernia. Unchanged L1 anterior wedging. IMPRESSION: CT CHEST: 1. Improving pulmonary edema. 2. Increased near-complete round atelectasis of the bilateral lower lobes and increased subsegmental atelectasis of the lingula. 3. Unchanged small right and moderate left pleural effusions with  associated pleural thickening, which may reflect chronic loculation. Recommend correlation with results of recent thoracentesis for evaluation of empyema. 4. Dilated main pulmonary artery measures 4.0 cm, which can be seen in the setting of pulmonary arterial hypertension. 5. A 10 mm AP window lymph node, likely reactive. CT ABDOMEN AND PELVIS: 1. Large volume stool within the rectosigmoid colon with mild circumferential mural thickening of the rectum, which may reflect stercoral colitis. Moderate volume stool throughout the remainder of the colon. 2. Small fat-containing paraumbilical hernia. Electronically Signed   By: Limin  Xu M.D.   On: 10/22/2024 17:13     Medications:    piperacillin -tazobactam (ZOSYN )  IV 2.25 g (10/23/24 0655)   [START ON 10/26/2024] vancomycin      vancomycin       bisacodyl   10 mg Oral QHS   bisacodyl   10 mg Rectal Daily   calcitRIOL   0.5 mcg Oral Daily   calcium  acetate  667 mg Oral TID WC   carvedilol   6.25 mg Oral BID WC   Chlorhexidine  Gluconate Cloth  6 each Topical Q0600   diltiazem   240 mg Oral QPM   doxepin   10 mg Oral QHS   doxycycline   100 mg Oral Q12H   epoetin  alfa-epbx (RETACRIT ) injection  4,000 Units Intravenous Q M,W,F-1800   famotidine   10 mg Oral Daily   furosemide   60 mg Intravenous BID   gabapentin   300 mg Oral QHS   heparin   5,000 Units Subcutaneous Q12H   insulin  aspart  0-9 Units Subcutaneous TID WC   insulin  glargine-yfgn  20 Units Subcutaneous BID   lactulose   20 g Oral BID   levothyroxine   175 mcg Oral QHS   linaclotide   145 mcg Oral QAC breakfast   loratadine   10 mg Oral Daily   melatonin  10 mg Oral QHS   midodrine   20 mg Oral Once per day on Monday Wednesday Friday   polyethylene glycol  17 g Oral BID   predniSONE   5 mg Oral Q breakfast   senna-docusate  1 tablet Oral BID   simethicone   80 mg Oral QID   sodium chloride  flush  3 mL Intravenous Q12H   SMOG  960 mL Rectal Daily   acetaminophen , bisacodyl , ipratropium-albuterol ,  metoCLOPramide  (REGLAN ) injection, ondansetron  (ZOFRAN ) IV, mouth rinse, oxyCODONE , sodium chloride  flush  Assessment/ Plan:  Eugene [name] is a 64 y.o.  male with past medical conditions including diabetes, hepatitis C, bilateral BKA, anxiety and depression, and end-stage renal disease on hemodialysis.  Patient presents to the emergency department from his facility complaining of shortness of breath.  Patient is currently admitted for CHF (congestive heart failure) (HCC) [I50.9] Acute respiratory failure with hypoxia (HCC) [J96.01] MRSA bacteremia [R78.81, B95.62] Hypervolemia, unspecified hypervolemia type [E87.70] Sepsis, due to unspecified organism, unspecified whether acute organ dysfunction present (HCC) [A41.9]  [facility]/MWF/120.5 kg  End-stage renal disease with hyperkalemia on hemodialysis.   Dialysis received yesterday, UF 1.9L achieved. Scheduled for dialysis later today to maintain outpatient schedule.     Anemia of chronic kidney disease Lab Results  Component Value Date   HGB 8.0 (L) 10/23/2024   Hemoglobin 8, continue low-dose IV Retacrit  4000 units with dialysis.  3. Secondary Hyperparathyroidism: with outpatient labs: None available  Lab Results  Component Value Date   PTH 177 (H) 04/23/2024   CALCIUM  7.8 (L) 10/23/2024   PHOS 7.6 (H) 10/22/2024   Hyperphosphatemia noted, will increase to calcium  acetate 1334 mg with meals.      LOS: 3 Syair Fricker 1/30/202612:37 PM   "

## 2024-10-23 NOTE — Inpatient Diabetes Management (Signed)
 Inpatient Diabetes Program Recommendations  AACE/ADA: New Consensus Statement on Inpatient Glycemic Control (2015)  Target Ranges:  Prepandial:   less than 140 mg/dL      Peak postprandial:   less than 180 mg/dL (1-2 hours)      Critically ill patients:  140 - 180 mg/dL    Latest Reference Range & Units 10/22/24 13:38 10/22/24 16:56  Glucose-Capillary 70 - 99 mg/dL 726 (H)  5 units Novolog   20 units Semglee   384 (H)  9 units Novolog   20 units Semglee  @2214    (H): Data is abnormally high  Latest Reference Range & Units 10/23/24 08:32 10/23/24 12:27  Glucose-Capillary 70 - 99 mg/dL 776 (H)  3 units Novolog   20 units Semglee  231 (H)  (H): Data is abnormally high  SNF Meds: Semglee  20 units BID         Humalog  0-15 QID per SSI     Current Orders: Semglee  20 units BID                             Novolog  0-9 units TID                            Prednisone  5 mg daily         MD- Please consider increasing the Novolog  SSI to the 0-15 unit Moderate scale       --Will follow patient during hospitalization--   Adina Rudolpho Arrow RN, MSN, CDCES Diabetes Coordinator Inpatient Glycemic Control Team Team Pager: [phone] (8a-5p)

## 2024-10-24 ENCOUNTER — Inpatient Hospital Stay

## 2024-10-24 DIAGNOSIS — J9601 Acute respiratory failure with hypoxia: Secondary | ICD-10-CM | POA: Diagnosis not present

## 2024-10-24 LAB — BLOOD GAS, VENOUS
Acid-Base Excess: 1.8 mmol/L (ref 0.0–2.0)
Bicarbonate: 28.6 mmol/L — ABNORMAL HIGH (ref 20.0–28.0)
O2 Saturation: 82.6 %
Patient temperature: 37
pCO2, Ven: 53 mmHg (ref 44–60)
pH, Ven: 7.34 (ref 7.25–7.43)
pO2, Ven: 47 mmHg — ABNORMAL HIGH (ref 32–45)

## 2024-10-24 LAB — C-REACTIVE PROTEIN: CRP: 1.9 mg/dL — ABNORMAL HIGH

## 2024-10-24 LAB — GLUCOSE, CAPILLARY
Glucose-Capillary: 153 mg/dL — ABNORMAL HIGH (ref 70–99)
Glucose-Capillary: 192 mg/dL — ABNORMAL HIGH (ref 70–99)
Glucose-Capillary: 380 mg/dL — ABNORMAL HIGH (ref 70–99)
Glucose-Capillary: 442 mg/dL — ABNORMAL HIGH (ref 70–99)

## 2024-10-24 MED ORDER — METHYLPREDNISOLONE SODIUM SUCC 40 MG IJ SOLR
40.0000 mg | Freq: Every day | INTRAMUSCULAR | Status: DC
  Administered 2024-10-24 – 2024-10-25 (×2): 40 mg via INTRAVENOUS
  Filled 2024-10-24 (×2): qty 1

## 2024-10-24 MED ORDER — NALOXEGOL OXALATE 12.5 MG PO TABS
12.5000 mg | ORAL_TABLET | Freq: Every day | ORAL | Status: DC
  Administered 2024-10-24 – 2024-10-28 (×5): 12.5 mg via ORAL
  Filled 2024-10-24 (×6): qty 1

## 2024-10-24 MED ORDER — INSULIN GLARGINE-YFGN 100 UNIT/ML ~~LOC~~ SOLN
25.0000 [IU] | Freq: Two times a day (BID) | SUBCUTANEOUS | Status: DC
  Administered 2024-10-24: 25 [IU] via SUBCUTANEOUS
  Filled 2024-10-24 (×2): qty 0.25

## 2024-10-24 MED ORDER — HEPARIN SODIUM (PORCINE) 1000 UNIT/ML IJ SOLN
INTRAMUSCULAR | Status: AC
  Filled 2024-10-24: qty 4

## 2024-10-24 MED ORDER — NALOXEGOL OXALATE 12.5 MG PO TABS: 12.5000 mg | ORAL_TABLET | Freq: Every day | ORAL | Status: DC

## 2024-10-24 NOTE — Progress Notes (Signed)
 "    PULMONOLOGY         Date: 10/24/2024,   MRN# [id number] Eugene [name] 1961-01-12     AdmissionWeight: 124.5 kg                 CurrentWeight: 126 kg  Referring provider: Dr Von   CHIEF COMPLAINT:   Acute hypoxemic respiratory failure    HISTORY OF PRESENT ILLNESS   This is a 64 year old male DNR CODE STATUS history of osteomyelitis, spinal stenosis, IBS, dyslipidemia HCV, ESRD diabetes depression anxiety, recent diagnosis of MRSA bacteremia completed vancomycin  at baseline is hypoxemic with requirement of supplemental oxygen at 6 L/min, has bilateral BKA, chronic diastolic heart failure, recently completed negative endocarditis workup.  Came into the hospital due to worsening hypoxemia requiring 15 L of supplemental O2 on arrival in the ER.  He was found to be febrile with worsening hemoglobin and renal function.  Chest x-ray showed bilateral multifocal pneumonia.  Despite maximal medical management patient continues to have significant hypoxemia with difficulty weaning oxygen requirement.  PCCM consultation for further evaluation management.  10/22/24- patient s/p HD today with noted obstruction of both ports treated with cathflo. He did not tolerate metaneb therapy and asked to RT to discontinue the treatments.  He is negative 3L fluid balance.  He is on 10L/min HFNC. Moderate left effusion on CT chest is noted.   10/24/27- Patient with no overnight events.  His bloodwork shows improved hemoglobin.  He is still on elevated settings with HFNC.  I've ordered thoracentesis for left moderate pleural effusion.  Patient is on doxy/zosyn .  He's on low dose prednisone .   10/24/24- patient on HFNC 50%, he reports breathing is not better. He had diagnostic thoracentesis.  So far gram stain and culture is negative.  He is in no distress.  He was not able to have therpeutic removal of fluid with thoracentesis due to body habitus.  He's eating breakfast well and speaking in full sentences  without struggling.  He has ESRD with fluid/edema everywhere. Overall prognosis is poor. Im repeating CXR for interval changes on edema/effusion.  He remains on lasix  60 bid with HD and doxy bid  PAST MEDICAL HISTORY   Past Medical History:  Diagnosis Date   Anxiety    Depression    Diabetes mellitus without complication (HCC)    End stage renal disease (HCC)    Hepatitis C    Hyperlipidemia    IBS (irritable bowel syndrome)    Osteomyelitis (HCC)    Spinal stenosis      SURGICAL HISTORY   Past Surgical History:  Procedure Laterality Date   BACK SURGERY     bilateral amputation Bilateral    BKA Bilateral    CENTRAL LINE INSERTION-TUNNELED N/A 02/02/2019   Procedure: CENTRAL LINE INSERTION-TUNNELED;  Surgeon: Marea Selinda RAMAN, MD;  Location: [facility];  Service: Cardiovascular;  Laterality: N/A;   COLONOSCOPY WITH PROPOFOL  N/A 01/12/2020   Procedure: COLONOSCOPY WITH PROPOFOL ;  Surgeon: Jinny Carmine, MD;  Location: [facility];  Service: Endoscopy;  Laterality: N/A;   DIALYSIS/PERMA CATHETER INSERTION N/A 05/22/2024   Procedure: DIALYSIS/PERMA CATHETER INSERTION;  Surgeon: Jama Cordella MATSU, MD;  Location: [facility];  Service: Cardiovascular;  Laterality: N/A;   DIALYSIS/PERMA CATHETER INSERTION N/A 07/21/2024   Procedure: DIALYSIS/PERMA CATHETER INSERTION;  Surgeon: Marea Selinda RAMAN, MD;  Location: [facility];  Service: Cardiovascular;  Laterality: N/A;   DIALYSIS/PERMA CATHETER INSERTION N/A 10/05/2024   Procedure: DIALYSIS/PERMA CATHETER INSERTION;  Surgeon: Marea Selinda RAMAN, MD;  Location: [facility];  Service: Cardiovascular;  Laterality: N/A;   DIALYSIS/PERMA CATHETER REMOVAL N/A 10/01/2024   Procedure: DIALYSIS/PERMA CATHETER REMOVAL;  Surgeon: Marea Selinda RAMAN, MD;  Location: [facility];  Service: Cardiovascular;  Laterality: N/A;   ESOPHAGOGASTRODUODENOSCOPY (EGD) WITH PROPOFOL  N/A 12/07/2019   Procedure: ESOPHAGOGASTRODUODENOSCOPY  (EGD) WITH PROPOFOL ;  Surgeon: Unk Corinn Skiff, MD;  Location: [facility];  Service: Gastroenterology;  Laterality: N/A;   IR CATHETER TUBE CHANGE  12/04/2019   SPINAL FUSION     THORACIC SPINE SURGERY  01/2019   extensive washout     FAMILY HISTORY   Family History  Problem Relation Age of Onset   Kidney failure Father    Kidney failure Brother      SOCIAL HISTORY   Social History[1]   MEDICATIONS    Home Medication:    Current Medication: Current Medications[2]    ALLERGIES   Patient has no known allergies.     REVIEW OF SYSTEMS    Review of Systems:  Gen:  Denies  fever, sweats, chills weigh loss  HEENT: Denies blurred vision, double vision, ear pain, eye pain, hearing loss, nose bleeds, sore throat Cardiac:  No dizziness, chest pain or heaviness, chest tightness,edema Resp:   reports dyspnea chronically  Gi: Denies swallowing difficulty, stomach pain, nausea or vomiting, diarrhea, constipation, bowel incontinence Gu:  Denies bladder incontinence, burning urine Ext:   Denies Joint pain, stiffness or swelling Skin: Denies  skin rash, easy bruising or bleeding or hives Endoc:  Denies polyuria, polydipsia , polyphagia or weight change Psych:   Denies depression, insomnia or hallucinations   Other:  All other systems negative   VS: BP (!) 161/71 (BP Location: Right Wrist)   Pulse 71   Temp 98.2 F (36.8 C)   Resp 20   Wt 126 kg   SpO2 94%   BMI 39.86 kg/m      PHYSICAL EXAM    GENERAL:NAD, no fevers, chills, no weakness no fatigue HEAD: Normocephalic, atraumatic.  EYES: Pupils equal, round, reactive to light. Extraocular muscles intact. No scleral icterus.  MOUTH: Moist mucosal membrane. Dentition intact. No abscess noted.  EAR, NOSE, THROAT: Clear without exudates. No external lesions.  NECK: Supple. No thyromegaly. No nodules. No JVD.  PULMONARY: decreased breath sounds with mild rhonchi worse at bases bilaterally.   CARDIOVASCULAR: S1 and S2. Regular rate and rhythm. No murmurs, rubs, or gallops. No edema. Pedal pulses 2+ bilaterally.  GASTROINTESTINAL: Soft, nontender, nondistended. No masses. Positive bowel sounds. No hepatosplenomegaly.  MUSCULOSKELETAL: No swelling, clubbing, or edema. Range of motion full in all extremities.  NEUROLOGIC: Cranial nerves II through XII are intact. No gross focal neurological deficits. Sensation intact. Reflexes intact.  SKIN: No ulceration, lesions, rashes, or cyanosis. Skin warm and dry. Turgor intact.  PSYCHIATRIC: Mood, affect within normal limits. The patient is awake, alert and oriented x 3. Insight, judgment intact.       IMAGING     ASSESSMENT/PLAN   Acute on chronic hypoxemic respiratory failure Patient with multiple etiologies of hypoxemia including  1. Pulmonary edema 2. Pleural effusions 3. Atelectasis 4. CHF 5. Restrictive ventilatory defect due to scoliosis 6. COPD 7. Morbid obesity  8. Physical deconditioning   Pulmonary edema - continue dialysis, net negative goal               - continue lasix  IV ESRD status  2. Pleural effusions - continue with HD, IR consult -  diagnostic thoracentesis 3. Atelectasis - ordering Chest PT with metaneb 4. Possible COPD exacerbation - noted bacterial and viral workup is in progress.  Continue doxycycline /cefepime  for now 4. CHF - acute on chronic - continue metoprolol  , lasix   5. Obesity - dietary changes, lifestyle modification 6. Physical deconditioning - contributing to dyspnea            Thank you for allowing me to participate in the care of this patient.   Patient/Family are satisfied with care plan and all questions have been answered.    Provider disclosure: Patient with at least one acute or chronic illness or injury that poses a threat to life or bodily function and is being managed actively during this encounter.  All of the below services have been performed independently by  signing provider:  review of prior documentation from internal and or external health records.  Review of previous and current lab results.  Interview and comprehensive assessment during patient visit today. Review of current and previous chest radiographs/CT scans. Discussion of management and test interpretation with health care team and patient/family.   This document was prepared using Dragon voice recognition software and may include unintentional dictation errors.     Britiany Silbernagel, M.D.  Division of Pulmonary & Critical Care Medicine                [1]  Social History Tobacco Use   Smoking status: Former    Current packs/day: 0.25    Types: Cigarettes   Smokeless tobacco: Never  Vaping Use   Vaping status: Every Day   Substances: Nicotine   Substance Use Topics   Alcohol use: Not Currently   Drug use: Not Currently  [2]  Current Facility-Administered Medications:    acetaminophen  (TYLENOL ) tablet 650 mg, 650 mg, Oral, Q6H PRN, Laurita Cort DASEN, MD   bisacodyl  (DULCOLAX) EC tablet 10 mg, 10 mg, Oral, QHS, Zhang, Ping T, MD, 10 mg at 10/23/24 2226   bisacodyl  (DULCOLAX) suppository 10 mg, 10 mg, Rectal, Daily PRN, Laurita Cort DASEN, MD   bisacodyl  (DULCOLAX) suppository 10 mg, 10 mg, Rectal, Daily, Von Bellis, MD, 10 mg at 10/22/24 1356   calcitRIOL  (ROCALTROL ) capsule 0.5 mcg, 0.5 mcg, Oral, Daily, Laurita Cort T, MD, 0.5 mcg at 10/23/24 1016   calcium  acetate (PHOSLO ) capsule 1,334 mg, 1,334 mg, Oral, TID WC, Breeze, Shantelle, NP, 1,334 mg at 10/23/24 1729   carvedilol  (COREG ) tablet 6.25 mg, 6.25 mg, Oral, BID WC, Von Bellis, MD, 6.25 mg at 10/23/24 1725   Chlorhexidine  Gluconate Cloth 2 % PADS 6 each, 6 each, Topical, Q0600, Lateef, Munsoor, MD, 6 each at 10/24/24 0551   diltiazem  (CARDIZEM  CD) 24 hr capsule 240 mg, 240 mg, Oral, QPM, Von Bellis, MD, 240 mg at 10/23/24 1729   doxepin  (SINEQUAN ) capsule 10 mg, 10 mg, Oral, QHS, Zhang, Ping T, MD, 10 mg at  10/23/24 2226   doxycycline  (VIBRA -TABS) tablet 100 mg, 100 mg, Oral, Q12H, Zeigler, Dustin G, RPH, 100 mg at 10/23/24 2226   epoetin  alfa-epbx (RETACRIT ) injection 4,000 Units, 4,000 Units, Intravenous, Q M,W,F-1800, Breeze, Shantelle, NP, 4,000 Units at 10/23/24 1900   famotidine  (PEPCID ) tablet 10 mg, 10 mg, Oral, Daily, Laurita, Ping T, MD, 10 mg at 10/23/24 1021   fluticasone  furoate-vilanterol (BREO ELLIPTA ) 200-25 MCG/ACT 1 puff, 1 puff, Inhalation, Daily, Tremane Spurgeon, MD, 1 puff at 10/23/24 1731   furosemide  (LASIX ) injection 60 mg, 60 mg, Intravenous, BID, Von Bellis, MD, 60 mg at 10/23/24 1728  gabapentin  (NEURONTIN ) capsule 300 mg, 300 mg, Oral, QHS, Zhang, Ping T, MD, 300 mg at 10/23/24 2225   guaiFENesin  (MUCINEX ) 12 hr tablet 600 mg, 600 mg, Oral, BID, Von Bellis, MD, 600 mg at 10/23/24 2226   heparin  injection 5,000 Units, 5,000 Units, Subcutaneous, Q12H, Laurita Cort DASEN, MD, 5,000 Units at 10/23/24 2226   insulin  aspart (novoLOG ) injection 0-9 Units, 0-9 Units, Subcutaneous, TID WC, Laurita Cort DASEN, MD, 7 Units at 10/23/24 1730   insulin  glargine-yfgn injection 20 Units, 20 Units, Subcutaneous, BID, Von Bellis, MD, 20 Units at 10/23/24 2227   ipratropium-albuterol  (DUONEB) 0.5-2.5 (3) MG/3ML nebulizer solution 3 mL, 3 mL, Nebulization, Q4H PRN, Von Bellis, MD, 3 mL at 10/23/24 2030   lactulose  (CHRONULAC ) 10 GM/15ML solution 20 g, 20 g, Oral, BID, Laurita Cort T, MD, 20 g at 10/23/24 2226   levothyroxine  (SYNTHROID ) tablet 175 mcg, 175 mcg, Oral, QHS, Laurita Cort DASEN, MD, 175 mcg at 10/23/24 2225   linaclotide  (LINZESS ) capsule 145 mcg, 145 mcg, Oral, QAC breakfast, Laurita Cort T, MD, 145 mcg at 10/23/24 1016   loratadine  (CLARITIN ) tablet 10 mg, 10 mg, Oral, Daily, Laurita Cort T, MD, 10 mg at 10/23/24 1019   melatonin tablet 10 mg, 10 mg, Oral, QHS, Zhang, Ping T, MD, 10 mg at 10/23/24 2226   metoCLOPramide  (REGLAN ) injection 5 mg, 5 mg, Intravenous, Q6H PRN, Laurita Cort T,  MD   midodrine  (PROAMATINE ) tablet 20 mg, 20 mg, Oral, Once per day on Monday Wednesday Friday, Laurita, Cort T, MD, 20 mg at 10/23/24 1018   ondansetron  (ZOFRAN ) injection 4 mg, 4 mg, Intravenous, Q6H PRN, Laurita Cort T, MD, 4 mg at 10/21/24 2349   Oral care mouth rinse, 15 mL, Mouth Rinse, PRN, Foust, Katy L, NP   oxyCODONE  (Oxy IR/ROXICODONE ) immediate release tablet 5 mg, 5 mg, Oral, Q6H PRN, Von Bellis, MD, 5 mg at 10/23/24 1346   piperacillin -tazobactam (ZOSYN ) IVPB 2.25 g, 2.25 g, Intravenous, Q8H, Von Bellis, MD, Last Rate: 100 mL/hr at 10/24/24 0551, 2.25 g at 10/24/24 0551   polyethylene glycol (MIRALAX  / GLYCOLAX ) packet 17 g, 17 g, Oral, BID, Zhang, Ping T, MD, 17 g at 10/23/24 2226   predniSONE  (DELTASONE ) tablet 5 mg, 5 mg, Oral, Q breakfast, Melvina Pangelinan, MD, 5 mg at 10/23/24 1020   senna-docusate (Senokot-S) tablet 1 tablet, 1 tablet, Oral, BID, Laurita Cort T, MD, 1 tablet at 10/23/24 2226   simethicone  (MYLICON) chewable tablet 80 mg, 80 mg, Oral, QID, Zhang, Ping T, MD, 80 mg at 10/23/24 2226   sodium chloride  flush (NS) 0.9 % injection 3 mL, 3 mL, Intravenous, Q12H, Laurita Cort T, MD, 3 mL at 10/23/24 2228   sodium chloride  flush (NS) 0.9 % injection 3 mL, 3 mL, Intravenous, PRN, Zhang, Ping T, MD   sorbitol , magnesium  hydroxide, mineral oil, glycerin  (SMOG) enema, 960 mL, Rectal, Daily, Von Bellis, MD, 960 mL at 10/23/24 1319   umeclidinium bromide  (INCRUSE ELLIPTA ) 62.5 MCG/ACT 1 puff, 1 puff, Inhalation, Daily, Samya Siciliano, MD, 1 puff at 10/23/24 1725   [START ON 10/26/2024] vancomycin  (VANCOCIN ) IVPB 1000 mg/200 mL premix, 1,000 mg, Intravenous, Q M,W,F-HD, Patel, Kishan S, RPH   vancomycin  (VANCOREADY) IVPB 750 mg/150 mL, 750 mg, Intravenous, Once, Tobie Cathaleen RAMAN, RPH  "

## 2024-10-24 NOTE — Plan of Care (Signed)

## 2024-10-24 NOTE — Progress Notes (Signed)
 [facility] Progress Note  Patient: Eugene [name]    FMW:[id number]  DOA: 10/19/2024     Date of Service: the patient was seen and examined on 10/24/2024  Chief Complaint  Patient presents with   Shortness of Breath   Brief hospital course: Eugene [name] is a 64 y.o. male with medical history significant of recently diagnosed MRSA bacteremia on vancomycin , ESRD on HD MWF, chronic hypoxic respiratory failure on 6 L continuously, bilateral BKA, chronic HFpEF, IDDM, HTN, HLD<hypothyroidism, chronic pain syndrome, obesity, presented with worsening of cough and hypoxia.   Patient was recently hospitalized for sepsis when he was diagnosed with MRSA bacteremia, endocarditis workup negative.  Subsequently patient was discharged home on vancomycin  Monday Wednesday Friday.  Last 2 days patient developed a dry cough with increasing hypoxia, this morning patient was found to be hypoxic and was placed on 15 L.  Patient denies any chest pain, no nausea vomiting no diarrhea.  He still makes urine but denied any dysuria no back pains. ED Course: Temperature 100.9, blood pressure 140/80 O2 saturation 89% on 10 L and stabilized on NRB but eventually stabilized on 6 L.  Chest x-ray showed bilateral pulmonary congestion.  Blood work showed WBC 10.8 hemoglobin 9.1 BUN 48 creatinine 5.6 bicarb 25, VBG 7.30 2/56/61.   Patient was given vancomycin  and cefepime  in the ED.  Nephrology was consulted for emergency HD.   Assessment and Plan:  # Acute on chronic hypoxic respiratory failure # Acute on chronic HFpEF decompensation -Stabilized - Secondary to fluid overload - Emergency HD this morning - Other DDx, patient has a concurrent low-grade fever, chest x-ray showed bilateral infiltrates most compatible with pulmonary congestion.  Atypical pneumonia cannot be ruled out, will check atypical pneumonia study Legionella and mycoplasma.  For now we will cover him with doxycycline .  Respiratory viral panel  negative Pulmonary consult 1/29 CT chest: Improving pulmonary edema, bilateral lower lobe atelectasis, pleural effusion right mild, moderate left effusion possible loculated empyema. 1/30 s/p US  left thoracentesis, 15 mL fluid was tapped, could not find adequate window to drain fluid 1/31 respiratory failure was getting worse, still has persistent moderate left pleural effusion.  Pulmonary to follow Started Solu-Medrol  40 mg IV daily for 3 days VBG pCO2 53, pO2 47 very low  # UTI Urine culture growing VRE E and Proteus, follow sensitivity Continue Vanco,  pharmacy consulted for dosing and trough monitoring  S/p cefepime  1/29 discontinued cefepime  and started Zosyn  to cover VRE    # Chronic constipation 1/28 KUB: Colonic ileus with moderate to severe stool burden  Last time patient had several situation and he refused NG tube and n.p.o. status.  Continue laxatives orally, use Dulcolax suppository and enema. 1/29 CT A/P: Large volume stool within the rectosigmoid colon, mild stercoral colitis, moderate stool throughout. 1/29 s/p mineral oil enema today, starting smog enema daily from 1/30 for 3 days 1/31 started Naloxegol  12.5 mg p.o. daily due to opioid-induced constipation/slow transit constipation, failed most of the laxatives.   # Hyperkalemia: Resolved - Urgency dialysis   Recently diagnosed MRSA bacteremia - Continue vancomycin  Monday Wednesday Friday, last dose will be November 11, 2024.   IDDM with hyperglycemia - Continue Lantus  and SSI   ESRD on HD - As above   Paroxysmal SVT - In sinus rhythm, continue Cardizem  and Coreg    # COPD exacerbation most likely due to volume overload Started Breo Ellipta  inhaler and Incruse Ellipta  inhaler Continue DuoNeb every 4 hourly as needed Continue supplemental O2 inhalation  Chronic pain syndrome - As needed oxycodone    Obesity class II - BMI= 39 - Calorie control recommended     Body mass index is 40.08 kg/m.   Interventions:  Wound 09/29/24 1832 Pressure Injury Buttocks Right;Left Deep Tissue Pressure Injury - Purple or maroon localized area of discolored intact skin or blood-filled blister due to damage of underlying soft tissue from pressure and/or shear. (Active)     Diet: Renal/carb modified diet DVT Prophylaxis: Subcutaneous Heparin     Advance goals of care discussion: DNR-limited  Family Communication: family was not present at bedside, at the time of interview.  The pt provided permission to discuss medical plan with the family. Opportunity was given to ask question and all questions were answered satisfactorily.   Disposition:  Pt is from SNF [name], admitted with respiratory failure, pneumonia and UTI, still on antibiotics and has respiratory distress, which precludes a safe discharge. Discharge to SNF back to [name], when stable, most likely in few days.  Subjective: No significant events overnight.  In the morning time patient was having difficulty breathing, does not feel better, also complaining of mild generalized abdominal pain, no any other complaints.   Physical Exam: General: mild - mod respiratory distress Affect depressed  Eyes: PERRLA ENT: Oral Mucosa Clear, moist  Neck: no JVD,  Cardiovascular: S1 and S2 Present, no Murmur,  Respiratory: good air entry bilaterally, bibasilar crackles, mild wheezes. Abdomen: BS present, Soft, but obese and distended, mild generalized tenderness,  Skin: no rashes Extremities: s/p  b/l BKA Neurologic: without any new focal findings Gait not checked due to patient safety concerns  Vitals:   10/24/24 1330 10/24/24 1348 10/24/24 1400 10/24/24 1430  BP: (!) 156/75 (!) 156/82 (!) 167/78 (!) 179/88  Pulse: 69 65 65 76  Resp: 16 16 18 16   Temp:      TempSrc:      SpO2: 96% 100% 95% 96%  Weight:        Intake/Output Summary (Last 24 hours) at 10/24/2024 1621 Last data filed at 10/24/2024 0900 Gross per 24 hour  Intake 120 ml   Output 1350 ml  Net -1230 ml   Filed Weights   10/23/24 0523 10/24/24 0500 10/24/24 1327  Weight: 124.7 kg 126 kg 126.7 kg    Data Reviewed: I have personally reviewed and interpreted daily labs, tele strips, imagings as discussed above. I reviewed all nursing notes, pharmacy notes, vitals, pertinent old records I have discussed plan of care as described above with RN and patient/family.  CBC: Recent Labs  Lab 10/20/24 0025 10/21/24 0450 10/22/24 0950 10/23/24 0302  WBC 10.8* 6.8 7.2 7.5  NEUTROABS 6.9  --   --   --   HGB 9.1* 7.9* 7.8* 8.0*  HCT 29.3* 25.7* 24.9* 26.1*  MCV 94.2 94.8 96.5 96.3  PLT 257 193 190 203   Basic Metabolic Panel: Recent Labs  Lab 10/20/24 0025 10/20/24 2054 10/21/24 0450 10/22/24 0950 10/23/24 0302  NA 132* 130* 131* 131* 128*  K 5.9* 5.6* 5.1 5.8* 5.4*  CL 94* 94* 94* 94* 94*  CO2 25 25 26 26 26   GLUCOSE 232* 350* 312* 310* 282*  BUN 48* 55* 57* 65* 48*  CREATININE 5.69* 5.84* 5.87* 5.97* 4.51*  CALCIUM  8.4* 7.8* 7.8* 7.6* 7.8*  MG  --   --   --  2.0  --   PHOS  --   --   --  7.6*  --     Studies: US  CHEST (  PLEURAL EFFUSION) Result Date: 10/24/2024 CLINICAL DATA:  Limited ultrasound of the chest done prior to possible thoracentesis. EXAM: CHEST ULTRASOUND COMPARISON:  CT CHEST WO CONTRAST-10/22/2024 FINDINGS: There is only a small pleural effusion present with no window to allow for safe approach for thoracentesis. IMPRESSION: Small pleural effusion. No window to allow for safe approach for thoracentesis. Ultrasound performed by: Sherrilee Bal, PA-C Electronically Signed   By: Wilkie Lent M.D.   On: 10/24/2024 09:23   US  THORACENTESIS LEFT ASP PLEURAL SPACE W/IMG GUIDE Result Date: 10/24/2024 INDICATION: 64 year old male with a history of ESRD on hemodialysis and heart failure currently admitted for [name] of acute on chronic hypoxic respiratory failure and heart failure exacerbation with concern for atypical pneumonia.  Request for diagnostic and therapeutic left-sided thoracentesis. EXAM: ULTRASOUND GUIDED DIAGNOSTIC AND THERAPEUTIC, LEFT-SIDED THORACENTESIS MEDICATIONS: 1% lidocaine , 8 mL. COMPLICATIONS: None immediate. PROCEDURE: An ultrasound guided thoracentesis was thoroughly discussed with the patient and questions answered. The benefits, risks, alternatives and complications were also discussed. The patient understands and wishes to proceed with the procedure. Written consent was obtained. Ultrasound was performed to localize and mark an adequate pocket of fluid in the left chest. The area was then prepped and draped in the normal sterile fashion. 1% Lidocaine  was used for local anesthesia. Under ultrasound guidance a 6 Fr Safe-T-Centesis catheter was introduced. Initial aspiration yielded approximately 15 mL of clear yellow fluid; however, drainage stopped shortly thereafter. Multiple attempts were made to readjust the catheter to allow for additional drainage, but were unsuccessful. The catheter was removed and a dressing applied. FINDINGS: A total of approximately 15 mL of clear yellow fluid was removed. Samples were sent to the laboratory as requested by the clinical team. IMPRESSION: Successful ultrasound guided left-sided thoracentesis yielding 15 mL of pleural fluid. Electronically Signed   By: Wilkie Lent M.D.   On: 10/24/2024 09:23   DG Chest Port 1 View Result Date: 10/24/2024 EXAM: 1 VIEW(S) XRAY OF THE CHEST 10/24/2024 08:33:00 AM COMPARISON: 10/23/2024 CLINICAL HISTORY: Pleural effusion. FINDINGS: LINES, TUBES AND DEVICES: Left internal jugular dual lumen central venous catheter stable in position. LUNGS AND PLEURA: Stable moderate left pleural effusion. Stable small right pleural effusion. Left basilar opacity. Similar degree of mild pulmonary edema. No pneumothorax. HEART AND MEDIASTINUM: Stable cardiomegaly. No acute abnormality of the mediastinal silhouette. BONES AND SOFT TISSUES: Thoracic spinal  fixation hardware noted. No acute osseous abnormality. IMPRESSION: 1. Stable moderate left pleural effusion and small right pleural effusion. 2. Left basilar opacity, unchanged. 3. Similar degree of mild pulmonary edema. Electronically signed by: Taylor Stroud MD 10/24/2024 09:07 AM EST RP Workstation: [id number]    Scheduled Meds:  bisacodyl   10 mg Oral QHS   calcitRIOL   0.5 mcg Oral Daily   calcium  acetate  1,334 mg Oral TID WC   carvedilol   6.25 mg Oral BID WC   Chlorhexidine  Gluconate Cloth  6 each Topical Q0600   diltiazem   240 mg Oral QPM   doxepin   10 mg Oral QHS   doxycycline   100 mg Oral Q12H   epoetin  alfa-epbx (RETACRIT ) injection  4,000 Units Intravenous Q M,W,F-1800   famotidine   10 mg Oral Daily   fluticasone  furoate-vilanterol  1 puff Inhalation Daily   furosemide   60 mg Intravenous BID   gabapentin   300 mg Oral QHS   guaiFENesin   600 mg Oral BID   heparin   5,000 Units Subcutaneous Q12H   insulin  aspart  0-9 Units Subcutaneous TID WC   insulin  glargine-yfgn  25  Units Subcutaneous BID   lactulose   20 g Oral BID   levothyroxine   175 mcg Oral QHS   linaclotide   145 mcg Oral QAC breakfast   loratadine   10 mg Oral Daily   melatonin  10 mg Oral QHS   methylPREDNISolone  (SOLU-MEDROL ) injection  40 mg Intravenous Daily   midodrine   20 mg Oral Once per day on Monday Wednesday Friday   polyethylene glycol  17 g Oral BID   predniSONE   5 mg Oral Q breakfast   senna-docusate  1 tablet Oral BID   simethicone   80 mg Oral QID   sodium chloride  flush  3 mL Intravenous Q12H   SMOG  960 mL Rectal Daily   umeclidinium bromide   1 puff Inhalation Daily   Continuous Infusions:  piperacillin -tazobactam (ZOSYN )  IV 2.25 g (10/24/24 0551)   [START ON 10/26/2024] vancomycin      vancomycin      PRN Meds: acetaminophen , bisacodyl , ipratropium-albuterol , metoCLOPramide  (REGLAN ) injection, ondansetron  (ZOFRAN ) IV, mouth rinse, oxyCODONE , sodium chloride  flush  Time spent: 55  minutes  Author: ELVAN SOR. MD [facility] 10/24/2024 4:21 PM  To reach On-call, see care teams to locate the attending and reach out to them via [email or web address]. If 7PM-7AM, please contact night-coverage If you still have difficulty reaching the attending provider, please page the [facility] (Director on Call) for [facility] on amion for assistance.

## 2024-10-25 DIAGNOSIS — J9601 Acute respiratory failure with hypoxia: Secondary | ICD-10-CM | POA: Diagnosis not present

## 2024-10-25 LAB — CBC
HCT: 26.7 % — ABNORMAL LOW (ref 39.0–52.0)
Hemoglobin: 8.5 g/dL — ABNORMAL LOW (ref 13.0–17.0)
MCH: 29.3 pg (ref 26.0–34.0)
MCHC: 31.8 g/dL (ref 30.0–36.0)
MCV: 92.1 fL (ref 80.0–100.0)
Platelets: 195 10*3/uL (ref 150–400)
RBC: 2.9 MIL/uL — ABNORMAL LOW (ref 4.22–5.81)
RDW: 14.6 % (ref 11.5–15.5)
WBC: 5.4 10*3/uL (ref 4.0–10.5)
nRBC: 0 % (ref 0.0–0.2)

## 2024-10-25 LAB — LD, BODY FLUID (OTHER): LD, Body Fluid: 62 [IU]/L

## 2024-10-25 LAB — CULTURE, BLOOD (ROUTINE X 2)
Culture: NO GROWTH
Culture: NO GROWTH

## 2024-10-25 LAB — BASIC METABOLIC PANEL WITH GFR
Anion gap: 11 (ref 5–15)
BUN: 47 mg/dL — ABNORMAL HIGH (ref 8–23)
CO2: 25 mmol/L (ref 22–32)
Calcium: 8.5 mg/dL — ABNORMAL LOW (ref 8.9–10.3)
Chloride: 93 mmol/L — ABNORMAL LOW (ref 98–111)
Creatinine, Ser: 4.11 mg/dL — ABNORMAL HIGH (ref 0.61–1.24)
GFR, Estimated: 16 mL/min — ABNORMAL LOW
Glucose, Bld: 466 mg/dL — ABNORMAL HIGH (ref 70–99)
Potassium: 5.3 mmol/L — ABNORMAL HIGH (ref 3.5–5.1)
Sodium: 130 mmol/L — ABNORMAL LOW (ref 135–145)

## 2024-10-25 LAB — GLUCOSE, CAPILLARY
Glucose-Capillary: 413 mg/dL — ABNORMAL HIGH (ref 70–99)
Glucose-Capillary: 528 mg/dL (ref 70–99)
Glucose-Capillary: 382 mg/dL — ABNORMAL HIGH (ref 70–99)
Glucose-Capillary: 379 mg/dL — ABNORMAL HIGH (ref 70–99)
Glucose-Capillary: 390 mg/dL — ABNORMAL HIGH (ref 70–99)
Glucose-Capillary: 412 mg/dL — ABNORMAL HIGH (ref 70–99)

## 2024-10-25 LAB — PROTEIN, BODY FLUID (OTHER): Total Protein, Body Fluid Other: 1 g/dL

## 2024-10-25 LAB — MAGNESIUM: Magnesium: 2 mg/dL (ref 1.7–2.4)

## 2024-10-25 LAB — GLUCOSE, RANDOM: Glucose, Bld: 424 mg/dL — ABNORMAL HIGH (ref 70–99)

## 2024-10-25 MED ORDER — PREDNISONE 10 MG PO TABS
5.0000 mg | ORAL_TABLET | Freq: Every day | ORAL | Status: DC
  Administered 2024-10-28 – 2024-10-29 (×2): 5 mg via ORAL
  Filled 2024-10-25 (×2): qty 1

## 2024-10-25 MED ORDER — INSULIN GLARGINE-YFGN 100 UNIT/ML ~~LOC~~ SOLN
30.0000 [IU] | Freq: Two times a day (BID) | SUBCUTANEOUS | Status: DC
  Administered 2024-10-25 – 2024-10-27 (×6): 30 [IU] via SUBCUTANEOUS
  Filled 2024-10-25 (×7): qty 0.3

## 2024-10-25 MED ORDER — PREDNISONE 20 MG PO TABS
20.0000 mg | ORAL_TABLET | Freq: Every day | ORAL | Status: DC
  Administered 2024-10-26: 20 mg via ORAL
  Filled 2024-10-25: qty 1

## 2024-10-25 MED ORDER — INSULIN ASPART 100 UNIT/ML IJ SOLN
20.0000 [IU] | Freq: Once | INTRAMUSCULAR | Status: AC
  Administered 2024-10-25: 20 [IU] via SUBCUTANEOUS

## 2024-10-25 MED ORDER — INSULIN ASPART 100 UNIT/ML IJ SOLN
20.0000 [IU] | Freq: Once | INTRAMUSCULAR | Status: AC
  Administered 2024-10-25: 20 [IU] via SUBCUTANEOUS
  Filled 2024-10-25: qty 20

## 2024-10-25 MED ORDER — INSULIN ASPART 100 UNIT/ML IJ SOLN
5.0000 [IU] | Freq: Once | INTRAMUSCULAR | Status: AC
  Administered 2024-10-25: 5 [IU] via SUBCUTANEOUS
  Filled 2024-10-25: qty 5

## 2024-10-25 NOTE — Plan of Care (Signed)

## 2024-10-25 NOTE — Progress Notes (Signed)
 [facility] Progress Note  Patient: Eugene [name]    FMW:[id number]  DOA: 10/19/2024     Date of Service: the patient was seen and examined on 10/25/2024  Chief Complaint  Patient presents with   Shortness of Breath   Brief hospital course: Eugene [name] is a 64 y.o. male with medical history significant of recently diagnosed MRSA bacteremia on vancomycin , ESRD on HD MWF, chronic hypoxic respiratory failure on 6 L continuously, bilateral BKA, chronic HFpEF, IDDM, HTN, HLD<hypothyroidism, chronic pain syndrome, obesity, presented with worsening of cough and hypoxia.   Patient was recently hospitalized for sepsis when he was diagnosed with MRSA bacteremia, endocarditis workup negative.  Subsequently patient was discharged home on vancomycin  Monday Wednesday Friday.  Last 2 days patient developed a dry cough with increasing hypoxia, this morning patient was found to be hypoxic and was placed on 15 L.  Patient denies any chest pain, no nausea vomiting no diarrhea.  He still makes urine but denied any dysuria no back pains. ED Course: Temperature 100.9, blood pressure 140/80 O2 saturation 89% on 10 L and stabilized on NRB but eventually stabilized on 6 L.  Chest x-ray showed bilateral pulmonary congestion.  Blood work showed WBC 10.8 hemoglobin 9.1 BUN 48 creatinine 5.6 bicarb 25, VBG 7.30 2/56/61.   Patient was given vancomycin  and cefepime  in the ED.  Nephrology was consulted for emergency HD.   Assessment and Plan:  # Acute on chronic hypoxic respiratory failure # Acute on chronic HFpEF decompensation -Stabilized - Secondary to fluid overload - Emergency HD this morning - Other DDx, patient has a concurrent low-grade fever, chest x-ray showed bilateral infiltrates most compatible with pulmonary congestion.  Atypical pneumonia cannot be ruled out, will check atypical pneumonia study Legionella and mycoplasma.  For now we will cover him with doxycycline .  Respiratory viral panel  negative Pulmonary consult 1/29 CT chest: Improving pulmonary edema, bilateral lower lobe atelectasis, pleural effusion right mild, moderate left effusion possible loculated empyema. 1/30 s/p US  left thoracentesis, 15 mL fluid was tapped, could not find adequate window to drain fluid 1/31 respiratory failure was getting worse, still has persistent moderate left pleural effusion.  Pulmonary to follow S/p Solu-Medrol  40 mg IV daily x 2 doses VBG pCO2 53, pO2 47 very low 2/1 decreased steroids, prednisone  20 mg p.o. daily x 2 doses followed by regular 5 mg p.o. daily home dose  # UTI Urine culture growing VRE E and Proteus, follow sensitivity Continue Vanco,  pharmacy consulted for dosing and trough monitoring  S/p cefepime  1/29 discontinued cefepime  and started Zosyn  to cover VRE    # Chronic constipation 1/28 KUB: Colonic ileus with moderate to severe stool burden  Last time patient had several situation and he refused NG tube and n.p.o. status.  Continue laxatives orally, use Dulcolax suppository and enema. 1/29 CT A/P: Large volume stool within the rectosigmoid colon, mild stercoral colitis, moderate stool throughout. 1/29 s/p mineral oil enema today, starting smog enema daily from 1/30 for 3 days 1/31 started Naloxegol  12.5 mg p.o. daily due to opioid-induced constipation/slow transit constipation, failed most of the laxatives.   # Hyperkalemia: Resolved - Urgency dialysis   Recently diagnosed MRSA bacteremia - Continue vancomycin  Monday Wednesday Friday, last dose will be November 11, 2024.   IDDM with hyperglycemia - Continue Lantus  and SSI 2/1 hyperglycemic secondary to steroids as above. Increased insulin , and frequent monitoring.  ESRD on HD - As above   Paroxysmal SVT - In sinus rhythm, continue Cardizem  and  Coreg    # COPD exacerbation most likely due to volume overload Started Breo Ellipta  inhaler and Incruse Ellipta  inhaler Continue DuoNeb every 4 hourly as  needed Continue supplemental O2 inhalation    Chronic pain syndrome - As needed oxycodone    Obesity class II - BMI= 39 - Calorie control recommended     Body mass index is 38.56 kg/m.  Interventions:  Wound 09/29/24 1832 Pressure Injury Buttocks Right;Left Deep Tissue Pressure Injury - Purple or maroon localized area of discolored intact skin or blood-filled blister due to damage of underlying soft tissue from pressure and/or shear. (Active)     Diet: Renal/carb modified diet DVT Prophylaxis: Subcutaneous Heparin     Advance goals of care discussion: DNR-limited  Family Communication: family was not present at bedside, at the time of interview.  The pt provided permission to discuss medical plan with the family. Opportunity was given to ask question and all questions were answered satisfactorily.   Disposition:  Pt is from SNF LTC [facility], admitted with respiratory failure, pneumonia and UTI, still on antibiotics and has respiratory distress, which precludes a safe discharge. Discharge to SNF back to LTC [facility], when stable, most likely in few days.  Subjective: No significant events overnight.  Patient is feeling improvement in the shortness of breath, patient did not move bowels today after an enema.    In the morning time patient was having difficulty breathing, does not feel better, also complaining of mild generalized abdominal pain, no any other complaints.   Physical Exam: General: mild - mod respiratory distress Affect depressed  Eyes: PERRLA ENT: Oral Mucosa Clear, moist  Neck: no JVD,  Cardiovascular: S1 and S2 Present, no Murmur,  Respiratory: good air entry bilaterally, bibasilar crackles, mild wheezes. Abdomen: BS present, Soft, but obese and distended, mild generalized tenderness,  Skin: no rashes Extremities: s/p  b/l BKA Neurologic: without any new focal findings Gait not checked due to patient safety concerns  Vitals:   10/25/24 0057 10/25/24 0300 10/25/24  0307 10/25/24 0913  BP: (!) 150/75   (!) 163/78  Pulse: 85   72  Resp: 18     Temp: 98.5 F (36.9 C)     TempSrc: Oral     SpO2: 96%  96% 98%  Weight:  121.9 kg      Intake/Output Summary (Last 24 hours) at 10/25/2024 1512 Last data filed at 10/25/2024 1100 Gross per 24 hour  Intake --  Output 3100 ml  Net -3100 ml   Filed Weights   10/24/24 1327 10/24/24 1708 10/25/24 0300  Weight: 126.7 kg 121.9 kg 121.9 kg    Data Reviewed: I have personally reviewed and interpreted daily labs, tele strips, imagings as discussed above. I reviewed all nursing notes, pharmacy notes, vitals, pertinent old records I have discussed plan of care as described above with RN and patient/family.  CBC: Recent Labs  Lab 10/20/24 0025 10/21/24 0450 10/22/24 0950 10/23/24 0302 10/25/24 0458  WBC 10.8* 6.8 7.2 7.5 5.4  NEUTROABS 6.9  --   --   --   --   HGB 9.1* 7.9* 7.8* 8.0* 8.5*  HCT 29.3* 25.7* 24.9* 26.1* 26.7*  MCV 94.2 94.8 96.5 96.3 92.1  PLT 257 193 190 203 195   Basic Metabolic Panel: Recent Labs  Lab 10/20/24 2054 10/21/24 0450 10/22/24 0950 10/23/24 0302 10/25/24 0458 10/25/24 0829  NA 130* 131* 131* 128* 130*  --   K 5.6* 5.1 5.8* 5.4* 5.3*  --   CL 94* 94*  94* 94* 93*  --   CO2 25 26 26 26 25   --   GLUCOSE 350* 312* 310* 282* 466* 424*  BUN 55* 57* 65* 48* 47*  --   CREATININE 5.84* 5.87* 5.97* 4.51* 4.11*  --   CALCIUM  7.8* 7.8* 7.6* 7.8* 8.5*  --   MG  --   --  2.0  --  2.0  --   PHOS  --   --  7.6*  --   --   --     Studies: DG Abd 1 View Result Date: 10/24/2024 EXAM: 1 VIEW XRAY OF THE ABDOMEN 10/24/2024 06:51:00 PM COMPARISON: 10/21/2024 CLINICAL HISTORY: Small bowel obstruction (HCC). FINDINGS: BOWEL: Gaseous distention of loops of small bowel in the right abdomen up to 3.5 cm, consistent with possible recurrent obstruction or ileus. Moderate volume of stool in the left hemicolon. SOFT TISSUES: No abnormal calcifications. BONES: Thoracic spinal fusion hardware in  place. No acute fracture. LUNGS AND PLEURAL SPACES: Persistent left pleural effusion and associated left basilar opacity. IMPRESSION: 1. Gaseous distention of loops of small bowel in the right abdomen up to 3.5 cm, which may reflect recurrent obstruction or ileus. 2. Moderate volume of stool in the left hemicolon. 3. Persistent left pleural effusion with associated left basilar opacity. Electronically signed by: Greig Pique MD 10/24/2024 07:00 PM EST RP Workstation: [id number]    Scheduled Meds:  bisacodyl   10 mg Oral QHS   calcitRIOL   0.5 mcg Oral Daily   calcium  acetate  1,334 mg Oral TID WC   carvedilol   6.25 mg Oral BID WC   Chlorhexidine  Gluconate Cloth  6 each Topical Q0600   diltiazem   240 mg Oral QPM   doxepin   10 mg Oral QHS   doxycycline   100 mg Oral Q12H   epoetin  alfa-epbx (RETACRIT ) injection  4,000 Units Intravenous Q M,W,F-1800   famotidine   10 mg Oral Daily   fluticasone  furoate-vilanterol  1 puff Inhalation Daily   furosemide   60 mg Intravenous BID   gabapentin   300 mg Oral QHS   guaiFENesin   600 mg Oral BID   heparin   5,000 Units Subcutaneous Q12H   insulin  aspart  0-9 Units Subcutaneous TID WC   insulin  glargine-yfgn  30 Units Subcutaneous BID   lactulose   20 g Oral BID   levothyroxine   175 mcg Oral QHS   linaclotide   145 mcg Oral QAC breakfast   loratadine   10 mg Oral Daily   melatonin  10 mg Oral QHS   midodrine   20 mg Oral Once per day on Monday Wednesday Friday   naloxegol  oxalate  12.5 mg Oral Daily   polyethylene glycol  17 g Oral BID   predniSONE   5 mg Oral Q breakfast   senna-docusate  1 tablet Oral BID   simethicone   80 mg Oral QID   sodium chloride  flush  3 mL Intravenous Q12H   SMOG  960 mL Rectal Daily   umeclidinium bromide   1 puff Inhalation Daily   Continuous Infusions:  piperacillin -tazobactam (ZOSYN )  IV 2.25 g (10/25/24 1305)   [START ON 10/26/2024] vancomycin      vancomycin      PRN Meds: acetaminophen , bisacodyl , ipratropium-albuterol ,  metoCLOPramide  (REGLAN ) injection, ondansetron  (ZOFRAN ) IV, mouth rinse, oxyCODONE , sodium chloride  flush  Time spent: 55 minutes  Author: ELVAN SOR. MD [facility] 10/25/2024 3:12 PM  To reach On-call, see care teams to locate the attending and reach out to them via [email or web address]. If 7PM-7AM, please contact night-coverage If you still have  difficulty reaching the attending provider, please page the [facility] (Director on Call) for [facility] on amion for assistance.

## 2024-10-26 ENCOUNTER — Inpatient Hospital Stay

## 2024-10-26 ENCOUNTER — Other Ambulatory Visit (HOSPITAL_COMMUNITY): Payer: Self-pay

## 2024-10-26 ENCOUNTER — Telehealth (HOSPITAL_COMMUNITY): Payer: Self-pay

## 2024-10-26 DIAGNOSIS — J9601 Acute respiratory failure with hypoxia: Secondary | ICD-10-CM | POA: Diagnosis not present

## 2024-10-26 LAB — CBC WITH DIFFERENTIAL/PLATELET
Abs Immature Granulocytes: 0.11 10*3/uL — ABNORMAL HIGH (ref 0.00–0.07)
Basophils Absolute: 0 10*3/uL (ref 0.0–0.1)
Basophils Relative: 0 %
Eosinophils Absolute: 0 10*3/uL (ref 0.0–0.5)
Eosinophils Relative: 0 %
HCT: 26.8 % — ABNORMAL LOW (ref 39.0–52.0)
Hemoglobin: 8.6 g/dL — ABNORMAL LOW (ref 13.0–17.0)
Immature Granulocytes: 1 %
Lymphocytes Relative: 12 %
Lymphs Abs: 1.2 10*3/uL (ref 0.7–4.0)
MCH: 30.2 pg (ref 26.0–34.0)
MCHC: 32.1 g/dL (ref 30.0–36.0)
MCV: 94 fL (ref 80.0–100.0)
Monocytes Absolute: 0.8 10*3/uL (ref 0.1–1.0)
Monocytes Relative: 8 %
Neutro Abs: 8.3 10*3/uL — ABNORMAL HIGH (ref 1.7–7.7)
Neutrophils Relative %: 79 %
Platelets: 201 10*3/uL (ref 150–400)
RBC: 2.85 MIL/uL — ABNORMAL LOW (ref 4.22–5.81)
RDW: 14.8 % (ref 11.5–15.5)
WBC: 10.5 10*3/uL (ref 4.0–10.5)
nRBC: 0 % (ref 0.0–0.2)

## 2024-10-26 LAB — GLUCOSE, CAPILLARY
Glucose-Capillary: 418 mg/dL — ABNORMAL HIGH (ref 70–99)
Glucose-Capillary: 438 mg/dL — ABNORMAL HIGH (ref 70–99)
Glucose-Capillary: 440 mg/dL — ABNORMAL HIGH (ref 70–99)
Glucose-Capillary: 436 mg/dL — ABNORMAL HIGH (ref 70–99)
Glucose-Capillary: 453 mg/dL — ABNORMAL HIGH (ref 70–99)
Glucose-Capillary: 341 mg/dL — ABNORMAL HIGH (ref 70–99)

## 2024-10-26 LAB — RENAL FUNCTION PANEL
Albumin: 3 g/dL — ABNORMAL LOW (ref 3.5–5.0)
Anion gap: 11 (ref 5–15)
BUN: 61 mg/dL — ABNORMAL HIGH (ref 8–23)
CO2: 26 mmol/L (ref 22–32)
Calcium: 8.6 mg/dL — ABNORMAL LOW (ref 8.9–10.3)
Chloride: 94 mmol/L — ABNORMAL LOW (ref 98–111)
Creatinine, Ser: 4.73 mg/dL — ABNORMAL HIGH (ref 0.61–1.24)
GFR, Estimated: 13 mL/min — ABNORMAL LOW
Glucose, Bld: 478 mg/dL — ABNORMAL HIGH (ref 70–99)
Phosphorus: 5.7 mg/dL — ABNORMAL HIGH (ref 2.5–4.6)
Potassium: 5.8 mmol/L — ABNORMAL HIGH (ref 3.5–5.1)
Sodium: 131 mmol/L — ABNORMAL LOW (ref 135–145)

## 2024-10-26 LAB — BODY FLUID CULTURE W GRAM STAIN: Culture: NO GROWTH

## 2024-10-26 LAB — GLUCOSE, RANDOM: Glucose, Bld: 461 mg/dL — ABNORMAL HIGH (ref 70–99)

## 2024-10-26 MED ORDER — EPOETIN ALFA-EPBX 4000 UNIT/ML IJ SOLN
INTRAMUSCULAR | Status: AC
  Filled 2024-10-26: qty 1

## 2024-10-26 MED ORDER — LIDOCAINE HCL (PF) 1 % IJ SOLN: 5.0000 mL | INTRAMUSCULAR | Status: DC | PRN

## 2024-10-26 MED ORDER — INSULIN ASPART 100 UNIT/ML IJ SOLN
20.0000 [IU] | Freq: Once | INTRAMUSCULAR | Status: AC
  Administered 2024-10-26: 20 [IU] via SUBCUTANEOUS
  Filled 2024-10-26: qty 20

## 2024-10-26 MED ORDER — ALTEPLASE 2 MG IJ SOLR
2.0000 mg | Freq: Once | INTRAMUSCULAR | Status: AC | PRN
  Administered 2024-10-26: 2 mg

## 2024-10-26 MED ORDER — HEPARIN SODIUM (PORCINE) 1000 UNIT/ML DIALYSIS: 1000.0000 [IU] | INTRAMUSCULAR | Status: DC | PRN

## 2024-10-26 MED ORDER — INSULIN ASPART 100 UNIT/ML IJ SOLN
20.0000 [IU] | Freq: Once | INTRAMUSCULAR | Status: AC
  Administered 2024-10-26: 20 [IU] via SUBCUTANEOUS

## 2024-10-26 MED ORDER — PENTAFLUOROPROP-TETRAFLUOROETH EX AERO: 1.0000 | INHALATION_SPRAY | CUTANEOUS | Status: DC | PRN

## 2024-10-26 MED ORDER — ALTEPLASE 2 MG IJ SOLR: 2.0000 mg | Freq: Once | INTRAMUSCULAR | Status: DC | PRN

## 2024-10-26 MED ORDER — ONDANSETRON HCL 4 MG/2ML IJ SOLN
INTRAMUSCULAR | Status: AC
  Filled 2024-10-26: qty 2

## 2024-10-26 MED ORDER — ALTEPLASE 2 MG IJ SOLR
INTRAMUSCULAR | Status: AC
  Filled 2024-10-26: qty 2

## 2024-10-26 MED ORDER — OXYCODONE HCL 5 MG PO TABS
ORAL_TABLET | ORAL | Status: AC
  Filled 2024-10-26: qty 1

## 2024-10-26 MED ORDER — LIDOCAINE-PRILOCAINE 2.5-2.5 % EX CREA: 1.0000 | TOPICAL_CREAM | CUTANEOUS | Status: DC | PRN

## 2024-10-26 MED ORDER — PREDNISONE 10 MG PO TABS
10.0000 mg | ORAL_TABLET | Freq: Every day | ORAL | Status: AC
  Administered 2024-10-27: 10 mg via ORAL
  Filled 2024-10-26: qty 1

## 2024-10-26 MED ORDER — STERILE WATER FOR INJECTION IJ SOLN
INTRAMUSCULAR | Status: AC
  Filled 2024-10-26: qty 10

## 2024-10-26 MED ORDER — HEPARIN SODIUM (PORCINE) 1000 UNIT/ML IJ SOLN
INTRAMUSCULAR | Status: AC
  Filled 2024-10-26: qty 4

## 2024-10-26 NOTE — Plan of Care (Signed)
  Problem: Education: Goal: Knowledge of General Education information will improve Description: Including pain rating scale, medication(s)/side effects and non-pharmacologic comfort measures Outcome: Progressing   Problem: Activity: Goal: Risk for activity intolerance will decrease Outcome: Progressing   Problem: Elimination: Goal: Will not experience complications related to bowel motility Outcome: Progressing Goal: Will not experience complications related to urinary retention Outcome: Progressing   Problem: Pain Managment: Goal: General experience of comfort will improve and/or be controlled Outcome: Progressing   Problem: Safety: Goal: Ability to remain free from injury will improve Outcome: Progressing   Problem: Skin Integrity: Goal: Risk for impaired skin integrity will decrease Outcome: Progressing

## 2024-10-26 NOTE — Progress Notes (Signed)
" °  Received patient in bed to unit.   Informed consent signed and in chart.    TX duration: 3hrs     Transported back to floor  Hand-off given to patient's nurse. No acute distress noted    Access used: L HD Catheter  Access issues: poor pull from both arterial and venous ports.   Activase  instilled to both ports for 1 hr.     Total UF removed: 0.4L Medication(s) given: cathflo, vancomycin , zofran , oxycodone , retacrit  Post HD VS: wnl      Olivia Hurst LPN [facility]   "

## 2024-10-27 LAB — GLUCOSE, CAPILLARY
Glucose-Capillary: 266 mg/dL — ABNORMAL HIGH (ref 70–99)
Glucose-Capillary: 320 mg/dL — ABNORMAL HIGH (ref 70–99)
Glucose-Capillary: 364 mg/dL — ABNORMAL HIGH (ref 70–99)
Glucose-Capillary: 392 mg/dL — ABNORMAL HIGH (ref 70–99)

## 2024-10-27 MED ORDER — SMOG ENEMA
960.0000 mL | Freq: Once | RECTAL | Status: DC
  Filled 2024-10-27 (×2): qty 960

## 2024-10-27 NOTE — Plan of Care (Signed)

## 2024-10-27 NOTE — Inpatient Diabetes Management (Signed)
 Inpatient Diabetes Program Recommendations  AACE/ADA: New Consensus Statement on Inpatient Glycemic Control (2015)  Target Ranges:  Prepandial:   less than 140 mg/dL      Peak postprandial:   less than 180 mg/dL (1-2 hours)      Critically ill patients:  140 - 180 mg/dL   Lab Results  Component Value Date   GLUCAP 320 (H) 10/27/2024   HGBA1C 7.3 (H) 06/24/2024    Review of Glycemic Control  Diabetes history: DM2 Outpatient Diabetes medications: Semglee  20 BID, Humalog  0-15 QID per SSI Current orders for Inpatient glycemic control: Semglee  30 units BID, Novolog  0-9 TID, Prednisone  5 mg daily  HgbA1C 7.3%  CBGs today: 266, 320  Expect CBGs to improve with reduced steroids  Inpatient Diabetes Program Recommendations:    Consider adding Novolog  4-6 units TID with meals if eating > 50%  If FBS is > 180 mg/dL in am, consider increasing Semglee  to 32 units BID.  Follow glucose trends.  Thank you. Shona Brandy, RD, LDN, CDCES Inpatient Diabetes Coordinator [phone]

## 2024-10-27 NOTE — Progress Notes (Addendum)
 [facility] Progress Note  Patient: Eugene [name]    FMW:[id number]  DOA: 10/19/2024     Date of Service: the patient was seen and examined on 10/27/2024  Chief Complaint  Patient presents with   Shortness of Breath   Brief hospital course: Eugene [name] is a 64 y.o. male with medical history significant of recently diagnosed MRSA bacteremia on vancomycin , ESRD on HD MWF, chronic hypoxic respiratory failure on 6 L continuously, bilateral BKA, chronic HFpEF, IDDM, HTN, HLD<hypothyroidism, chronic pain syndrome, obesity, presented with worsening of cough and hypoxia.   Patient was recently hospitalized for sepsis when he was diagnosed with MRSA bacteremia, endocarditis workup negative.  Subsequently patient was discharged home on vancomycin  Monday Wednesday Friday.  Last 2 days patient developed a dry cough with increasing hypoxia, this morning patient was found to be hypoxic and was placed on 15 L.  Patient denies any chest pain, no nausea vomiting no diarrhea.  He still makes urine but denied any dysuria no back pains. ED Course: Temperature 100.9, blood pressure 140/80 O2 saturation 89% on 10 L and stabilized on NRB but eventually stabilized on 6 L.  Chest x-ray showed bilateral pulmonary congestion.  Blood work showed WBC 10.8 hemoglobin 9.1 BUN 48 creatinine 5.6 bicarb 25, VBG 7.30 2/56/61.   Patient was given vancomycin  and cefepime  in the ED.  Nephrology was consulted for emergency HD.   Assessment and Plan:  # Acute on chronic hypoxic respiratory failure # Acute on chronic HFpEF decompensation - Secondary to fluid overload and copd exacerbation - Emergency HD this morning - Other DDx, patient has a concurrent low-grade fever, chest x-ray showed bilateral infiltrates most compatible with pulmonary congestion.  Atypical pneumonia cannot be ruled out, will check atypical pneumonia study Legionella and mycoplasma.  For now we will cover him with doxycycline .  Respiratory viral panel  negative Pulmonary consult 1/29 CT chest: Improving pulmonary edema, bilateral lower lobe atelectasis, pleural effusion right mild, moderate left effusion possible loculated empyema. 1/30 s/p US  left thoracentesis, 15 mL fluid was tapped, could not find adequate window to drain fluid 1/31 respiratory failure was getting worse, still has persistent moderate left pleural effusion.  Pulmonary to follow S/p Solu-Medrol  40 mg IV daily x 2 doses VBG pCO2 53, pO2 47 very low 2/1 decreased steroids, prednisone  20 mg p.o. daily x 1 and 10 mg x 1 followed by regular 5 mg p.o. daily home dose   # UTI Urine culture growing Enterococcus faecalis and Proteus Continue Vanco,  pharmacy consulted for dosing and trough monitoring  S/p cefepime  1/29 discontinued cefepime  and started Zosyn  to cover VRE 2/2 ID recommended no need of antibiotics for Ucx positive in ESRD patients.     # Chronic constipation improved with the Naloxegol  1/28 KUB: Colonic ileus with moderate to severe stool burden  Last time patient had several situation and he refused NG tube and n.p.o. status.  Continue laxatives orally, use Dulcolax suppository and enema. 1/29 CT A/P: Large volume stool within the rectosigmoid colon, mild stercoral colitis, moderate stool throughout. 1/29 s/p mineral oil enema today, starting smog enema daily from 1/30 for 3 days 1/31 started Naloxegol  12.5 mg p.o. daily due to opioid-induced constipation/slow transit constipation, failed most of the laxatives. 2/2 KUB: No SBO, scattered gas all over and unremarkable colonic stool burden. 2/3 smog enema today  # Hyperkalemia: Resolved - Urgency dialysis   Recently diagnosed MRSA bacteremia - Continue vancomycin  Monday Wednesday Friday, last dose will be November 11, 2024.  IDDM with hyperglycemia - Continue Lantus  and SSI 2/1 hyperglycemic secondary to steroids as above. Increased insulin , and frequent monitoring. 2/2 hyperglycemia most likely due to  steroids, frequent monitoring and NovoLog  multiple doses given.   ESRD on HD - As above 2/3 as per nephro hemodialysis catheter needs to be replaced, it is not functioning well so vascular surgery has been consulted.   Keep n.p.o. after midnight, vascular surgery will replace HD catheter tomorrow a.m.    Paroxysmal SVT - In sinus rhythm, continue Cardizem  and Coreg    # COPD exacerbation most likely due to volume overload Started Breo Ellipta  inhaler and Incruse Ellipta  inhaler Continue DuoNeb every 4 hourly as needed Continue supplemental O2 inhalation    Chronic pain syndrome - As needed oxycodone    Obesity class II - BMI= 39 - Calorie control recommended     Body mass index is 39.54 kg/m.  Interventions:  Wound 09/29/24 1832 Pressure Injury Buttocks Right;Left Deep Tissue Pressure Injury - Purple or maroon localized area of discolored intact skin or blood-filled blister due to damage of underlying soft tissue from pressure and/or shear. (Active)     Diet: Renal/carb modified diet DVT Prophylaxis: Subcutaneous Heparin     Advance goals of care discussion: DNR-limited  Family Communication: family was not present at bedside, at the time of interview.  The pt provided permission to discuss medical plan with the family. Opportunity was given to ask question and all questions were answered satisfactorily.   Disposition:  Pt is from SNF [facility], admitted with respiratory failure, pneumonia and UTI, chronic constipation, received multiple enemas.  Currently patient is getting better.  Awaiting for hemodialysis catheter to be replaced by vascular surgery and then discharge to SNF.   Discharge to SNF back to [facility], when stable, most likely in few days.  Subjective: No significant events overnight.  Patient still feels that his breathing is not getting better, requesting to increase oxygen Patient agreed for enema today Patient is aware that hemodialysis catheter will be  replaced.   Physical Exam: General: NAD, resting comfortably Affect depressed  Eyes: PERRLA ENT: Oral Mucosa Clear, moist  Neck: no JVD,  Cardiovascular: S1 and S2 Present, no Murmur,  Respiratory: good air entry bilaterally, bibasilar crackles, No wheezes. Abdomen: BS present, Soft, but obese and distended, NT Skin: no rashes Extremities: s/p  b/l BKA Neurologic: without any new focal findings Gait not checked due to patient safety concerns  Vitals:   10/26/24 2355 10/27/24 0500 10/27/24 1348 10/27/24 1402  BP: 131/77  (!) 132/90   Pulse: 74  79   Resp:   17   Temp: 97.8 F (36.6 C)  98.4 F (36.9 C)   TempSrc: Oral  Oral   SpO2: 99%  99% 99%  Weight:  125 kg      Intake/Output Summary (Last 24 hours) at 10/27/2024 1551 Last data filed at 10/27/2024 0900 Gross per 24 hour  Intake 1416.86 ml  Output 400 ml  Net 1016.86 ml   Filed Weights   10/25/24 0300 10/26/24 0440 10/27/24 0500  Weight: 121.9 kg 123.7 kg 125 kg    Data Reviewed: I have personally reviewed and interpreted daily labs, tele strips, imagings as discussed above. I reviewed all nursing notes, pharmacy notes, vitals, pertinent old records I have discussed plan of care as described above with RN and patient/family.  CBC: Recent Labs  Lab 10/21/24 0450 10/22/24 0950 10/23/24 0302 10/25/24 0458 10/26/24 0742  WBC 6.8 7.2 7.5 5.4 10.5  NEUTROABS  --   --   --   --  8.3*  HGB 7.9* 7.8* 8.0* 8.5* 8.6*  HCT 25.7* 24.9* 26.1* 26.7* 26.8*  MCV 94.8 96.5 96.3 92.1 94.0  PLT 193 190 203 195 201   Basic Metabolic Panel: Recent Labs  Lab 10/21/24 0450 10/22/24 0950 10/23/24 0302 10/25/24 0458 10/25/24 0829 10/26/24 0742 10/26/24 1050  NA 131* 131* 128* 130*  --  131*  --   K 5.1 5.8* 5.4* 5.3*  --  5.8*  --   CL 94* 94* 94* 93*  --  94*  --   CO2 26 26 26 25   --  26  --   GLUCOSE 312* 310* 282* 466* 424* 478* 461*  BUN 57* 65* 48* 47*  --  61*  --   CREATININE 5.87* 5.97* 4.51* 4.11*  --   4.73*  --   CALCIUM  7.8* 7.6* 7.8* 8.5*  --  8.6*  --   MG  --  2.0  --  2.0  --   --   --   PHOS  --  7.6*  --   --   --  5.7*  --     Studies: No results found.   Scheduled Meds:  bisacodyl   10 mg Oral QHS   calcitRIOL   0.5 mcg Oral Daily   calcium  acetate  1,334 mg Oral TID WC   carvedilol   6.25 mg Oral BID WC   Chlorhexidine  Gluconate Cloth  6 each Topical Q0600   diltiazem   240 mg Oral QPM   doxepin   10 mg Oral QHS   epoetin  alfa-epbx (RETACRIT ) injection  4,000 Units Intravenous Q M,W,F-1800   famotidine   10 mg Oral Daily   fluticasone  furoate-vilanterol  1 puff Inhalation Daily   furosemide   60 mg Intravenous BID   gabapentin   300 mg Oral QHS   guaiFENesin   600 mg Oral BID   heparin   5,000 Units Subcutaneous Q12H   insulin  aspart  0-9 Units Subcutaneous TID WC   insulin  glargine-yfgn  30 Units Subcutaneous BID   lactulose   20 g Oral BID   levothyroxine   175 mcg Oral QHS   linaclotide   145 mcg Oral QAC breakfast   loratadine   10 mg Oral Daily   melatonin  10 mg Oral QHS   midodrine   20 mg Oral Once per day on Monday Wednesday Friday   naloxegol  oxalate  12.5 mg Oral Daily   polyethylene glycol  17 g Oral BID   [START ON 10/28/2024] predniSONE   5 mg Oral Q breakfast   senna-docusate  1 tablet Oral BID   simethicone   80 mg Oral QID   sodium chloride  flush  3 mL Intravenous Q12H   umeclidinium bromide   1 puff Inhalation Daily   Continuous Infusions:  vancomycin  1,000 mg (10/26/24 1722)   PRN Meds: acetaminophen , bisacodyl , ipratropium-albuterol , metoCLOPramide  (REGLAN ) injection, ondansetron  (ZOFRAN ) IV, mouth rinse, oxyCODONE , sodium chloride  flush  Time spent: 40 minutes  Author: ELVAN SOR. MD [facility] 10/27/2024 3:51 PM  To reach On-call, see care teams to locate the attending and reach out to them via [email or web address]. If 7PM-7AM, please contact night-coverage If you still have difficulty reaching the attending provider, please page the [facility] (Director  on Call) for [facility] on amion for assistance.

## 2024-10-28 ENCOUNTER — Encounter: Payer: Self-pay | Admitting: Vascular Surgery

## 2024-10-28 ENCOUNTER — Encounter
Admission: EM | Disposition: A | Discharge status: Skilled Nursing Facility | Payer: Self-pay | Source: Skilled Nursing Facility | Attending: Student

## 2024-10-28 DIAGNOSIS — J9601 Acute respiratory failure with hypoxia: Secondary | ICD-10-CM | POA: Diagnosis not present

## 2024-10-28 LAB — BASIC METABOLIC PANEL WITH GFR
Anion gap: 11 (ref 5–15)
BUN: 63 mg/dL — ABNORMAL HIGH (ref 8–23)
CO2: 26 mmol/L (ref 22–32)
Calcium: 8.1 mg/dL — ABNORMAL LOW (ref 8.9–10.3)
Chloride: 94 mmol/L — ABNORMAL LOW (ref 98–111)
Creatinine, Ser: 4.41 mg/dL — ABNORMAL HIGH (ref 0.61–1.24)
GFR, Estimated: 14 mL/min — ABNORMAL LOW
Glucose, Bld: 436 mg/dL — ABNORMAL HIGH (ref 70–99)
Potassium: 6 mmol/L — ABNORMAL HIGH (ref 3.5–5.1)
Sodium: 131 mmol/L — ABNORMAL LOW (ref 135–145)

## 2024-10-28 LAB — CBC
HCT: 28 % — ABNORMAL LOW (ref 39.0–52.0)
Hemoglobin: 8.8 g/dL — ABNORMAL LOW (ref 13.0–17.0)
MCH: 30.1 pg (ref 26.0–34.0)
MCHC: 31.4 g/dL (ref 30.0–36.0)
MCV: 95.9 fL (ref 80.0–100.0)
Platelets: 175 10*3/uL (ref 150–400)
RBC: 2.92 MIL/uL — ABNORMAL LOW (ref 4.22–5.81)
RDW: 15 % (ref 11.5–15.5)
WBC: 9.2 10*3/uL (ref 4.0–10.5)
nRBC: 0 % (ref 0.0–0.2)

## 2024-10-28 LAB — GLUCOSE, CAPILLARY
Glucose-Capillary: 385 mg/dL — ABNORMAL HIGH (ref 70–99)
Glucose-Capillary: 332 mg/dL — ABNORMAL HIGH (ref 70–99)
Glucose-Capillary: 328 mg/dL — ABNORMAL HIGH (ref 70–99)
Glucose-Capillary: 254 mg/dL — ABNORMAL HIGH (ref 70–99)
Glucose-Capillary: 315 mg/dL — ABNORMAL HIGH (ref 70–99)

## 2024-10-28 MED ORDER — HEPARIN SODIUM (PORCINE) 1000 UNIT/ML DIALYSIS: 1000.0000 [IU] | INTRAMUSCULAR | Status: DC | PRN

## 2024-10-28 MED ORDER — INSULIN GLARGINE-YFGN 100 UNIT/ML ~~LOC~~ SOLN
35.0000 [IU] | Freq: Two times a day (BID) | SUBCUTANEOUS | Status: DC
  Administered 2024-10-28 – 2024-10-29 (×3): 35 [IU] via SUBCUTANEOUS
  Filled 2024-10-28 (×4): qty 0.35

## 2024-10-28 MED ORDER — FENTANYL CITRATE (PF) 100 MCG/2ML IJ SOLN
INTRAMUSCULAR | Status: DC | PRN
  Administered 2024-10-28 (×2): 25 ug via INTRAVENOUS

## 2024-10-28 MED ORDER — CEFAZOLIN SODIUM-DEXTROSE 1-4 GM/50ML-% IV SOLN
1.0000 g | Freq: Once | INTRAVENOUS | Status: DC
  Administered 2024-10-28: 1 g via INTRAVENOUS

## 2024-10-28 MED ORDER — MIDAZOLAM HCL 2 MG/2ML IJ SOLN
INTRAMUSCULAR | Status: AC
  Filled 2024-10-28: qty 2

## 2024-10-28 MED ORDER — HEPARIN SODIUM (PORCINE) 1000 UNIT/ML IJ SOLN
INTRAMUSCULAR | Status: AC
  Filled 2024-10-28: qty 10

## 2024-10-28 MED ORDER — HEPARIN (PORCINE) IN NACL 1000-0.9 UT/500ML-% IV SOLN
INTRAVENOUS | Status: DC | PRN
  Administered 2024-10-28: 500 mL

## 2024-10-28 MED ORDER — INSULIN ASPART 100 UNIT/ML IJ SOLN
6.0000 [IU] | Freq: Three times a day (TID) | INTRAMUSCULAR | Status: DC
  Administered 2024-10-28 – 2024-10-29 (×3): 6 [IU] via SUBCUTANEOUS
  Filled 2024-10-28 (×3): qty 6

## 2024-10-28 MED ORDER — CEFAZOLIN SODIUM-DEXTROSE 2-4 GM/100ML-% IV SOLN: 2.0000 g | INTRAVENOUS | Status: DC

## 2024-10-28 MED ORDER — SODIUM CHLORIDE 0.9 % IV SOLN: INTRAVENOUS | Status: DC

## 2024-10-28 MED ORDER — FENTANYL CITRATE (PF) 50 MCG/ML IJ SOSY
PREFILLED_SYRINGE | INTRAMUSCULAR | Status: AC
  Filled 2024-10-28: qty 1

## 2024-10-28 MED ORDER — LIDOCAINE-EPINEPHRINE (PF) 1 %-1:200000 IJ SOLN
INTRAMUSCULAR | Status: DC | PRN
  Administered 2024-10-28: 20 mL via INTRADERMAL

## 2024-10-28 MED ORDER — HEPARIN SODIUM (PORCINE) 10000 UNIT/ML IJ SOLN
INTRAMUSCULAR | Status: AC
  Filled 2024-10-28: qty 1

## 2024-10-28 MED ORDER — EPOETIN ALFA-EPBX 4000 UNIT/ML IJ SOLN
INTRAMUSCULAR | Status: AC
  Filled 2024-10-28: qty 1

## 2024-10-28 MED ORDER — CEFAZOLIN SODIUM-DEXTROSE 1-4 GM/50ML-% IV SOLN
INTRAVENOUS | Status: AC
  Filled 2024-10-28: qty 50

## 2024-10-28 MED ORDER — HEPARIN SODIUM (PORCINE) 1000 UNIT/ML IJ SOLN
INTRAMUSCULAR | Status: AC
  Filled 2024-10-28: qty 5

## 2024-10-28 MED ORDER — HEPARIN SODIUM (PORCINE) 10000 UNIT/ML IJ SOLN
INTRAMUSCULAR | Status: DC | PRN
  Administered 2024-10-28: 10000 [IU]

## 2024-10-28 MED ORDER — ALTEPLASE 2 MG IJ SOLR: 2.0000 mg | Freq: Once | INTRAMUSCULAR | Status: DC | PRN

## 2024-10-28 MED ORDER — SODIUM ZIRCONIUM CYCLOSILICATE 10 G PO PACK
10.0000 g | PACK | Freq: Once | ORAL | Status: AC
  Administered 2024-10-28: 10 g via ORAL
  Filled 2024-10-28: qty 1

## 2024-10-28 MED ORDER — MIDAZOLAM HCL (PF) 2 MG/2ML IJ SOLN
INTRAMUSCULAR | Status: DC | PRN
  Administered 2024-10-28 (×2): 1 mg via INTRAVENOUS

## 2024-10-28 NOTE — Progress Notes (Signed)
 Patient refused lactulose  and myrlax. MD made aware.

## 2024-10-28 NOTE — Progress Notes (Signed)
Patient off floor to procedure.

## 2024-10-28 NOTE — Progress Notes (Signed)
 " [facility]  ROUNDING NOTE   Subjective:   Eugene [name] is a 64 year old male with past medical conditions including diabetes, hepatitis C, bilateral BKA, anxiety and depression, and end-stage renal disease on hemodialysis.  Patient presents to the emergency department from his facility complaining of shortness of breath.  Patient is currently admitted for CHF (congestive heart failure) (HCC) [I50.9] Acute respiratory failure with hypoxia (HCC) [J96.01] MRSA bacteremia [R78.81, B95.62] Hypervolemia, unspecified hypervolemia type [E87.70] Sepsis, due to unspecified organism, unspecified whether acute organ dysfunction present [facility]) [A41.9]  Patient is known to our practice and receives outpatient dialysis treatments at [facility] on a MWF schedule, supervised by Dr. Dennise.    Update:  Patient seen resting in bed after procedure Permcath exchanged this morning Denies pain at this time  Scheduled for dialysis later today   Objective:  Vital signs in last 24 hours:  Temp:  [97.5 F (36.4 C)-98.5 F (36.9 C)] 98 F (36.7 C) (02/04 1258) Pulse Rate:  [62-80] 73 (02/04 1530) Resp:  [10-20] 16 (02/04 1530) BP: (115-171)/(58-96) 135/58 (02/04 1530) SpO2:  [73 %-100 %] 100 % (02/04 1530) Weight:  [123.4 kg-125.3 kg] 123.4 kg (02/04 1258)  Weight change: 0.3 kg Filed Weights   10/27/24 0500 10/28/24 0411 10/28/24 1258  Weight: 125 kg 125.3 kg 123.4 kg     Intake/Output: I/O last 3 completed shifts: In: 2136.9 [P.O.:2040; IV Piggyback:96.9] Out: -    Intake/Output this shift:  Total I/O In: -  Out: 1700 [Urine:1700]  Physical Exam: General: NAD  Head: Normocephalic  Eyes: Anicteric  Lungs:  Coarse, HFNC  Heart: Regular rate and rhythm  Abdomen:  Soft, nontender  Extremities: ABD and BUE edema.  Bilateral BKA  Neurologic: Awake, alert, conversant  Skin: Warm,dry, no rash  Access: Lt internal jugular permcath    Basic Metabolic Panel: Recent  Labs  Lab 10/22/24 0950 10/23/24 0302 10/25/24 0458 10/25/24 0829 10/26/24 0742 10/26/24 1050 10/28/24 0420  NA 131* 128* 130*  --  131*  --  131*  K 5.8* 5.4* 5.3*  --  5.8*  --  6.0*  CL 94* 94* 93*  --  94*  --  94*  CO2 26 26 25   --  26  --  26  GLUCOSE 310* 282* 466* 424* 478* 461* 436*  BUN 65* 48* 47*  --  61*  --  63*  CREATININE 5.97* 4.51* 4.11*  --  4.73*  --  4.41*  CALCIUM  7.6* 7.8* 8.5*  --  8.6*  --  8.1*  MG 2.0  --  2.0  --   --   --   --   PHOS 7.6*  --   --   --  5.7*  --   --     Liver Function Tests: Recent Labs  Lab 10/26/24 0742  ALBUMIN  3.0*   No results for input(s): LIPASE, AMYLASE in the last 168 hours.  No results for input(s): AMMONIA in the last 168 hours.  CBC: Recent Labs  Lab 10/22/24 0950 10/23/24 0302 10/25/24 0458 10/26/24 0742 10/28/24 0420  WBC 7.2 7.5 5.4 10.5 9.2  NEUTROABS  --   --   --  8.3*  --   HGB 7.8* 8.0* 8.5* 8.6* 8.8*  HCT 24.9* 26.1* 26.7* 26.8* 28.0*  MCV 96.5 96.3 92.1 94.0 95.9  PLT 190 203 195 201 175    Cardiac Enzymes: No results for input(s): CKTOTAL, CKMB, CKMBINDEX, TROPONINI in the last 168 hours.  BNP:  Invalid input(s): POCBNP  CBG: Recent Labs  Lab 10/27/24 1651 10/27/24 2103 10/28/24 0804 10/28/24 0859 10/28/24 1056  GLUCAP 364* 392* 385* 332* 328*    Microbiology: Results for orders placed or performed during the hospital encounter of 10/19/24  Blood Culture (routine x 2)     Status: None   Collection Time: 10/19/24 11:53 PM   Specimen: BLOOD  Result Value Ref Range Status   Specimen Description BLOOD RIGHT UPPER ARM  Final   Special Requests   Final    BOTTLES DRAWN AEROBIC AND ANAEROBIC Blood Culture results may not be optimal due to an inadequate volume of blood received in culture bottles   Culture   Final    NO GROWTH 5 DAYS Performed at [facility], [address] [place], [place] [address]    Report Status 10/25/2024 FINAL  Final  Resp panel  by RT-PCR (RSV, Flu A&B, Covid) Anterior Nasal Swab     Status: None   Collection Time: 10/19/24 11:53 PM   Specimen: Anterior Nasal Swab  Result Value Ref Range Status   SARS Coronavirus 2 by RT PCR NEGATIVE NEGATIVE Final    Comment: (NOTE) SARS-CoV-2 target nucleic acids are NOT DETECTED.  The SARS-CoV-2 RNA is generally detectable in upper respiratory specimens during the acute phase of infection. The lowest concentration of SARS-CoV-2 viral copies this assay can detect is 138 copies/mL. A negative result does not preclude SARS-Cov-2 infection and should not be used as the sole basis for treatment or other patient management decisions. A negative result may occur with  improper specimen collection/handling, submission of specimen other than nasopharyngeal swab, presence of viral mutation(s) within the areas targeted by this assay, and inadequate number of viral copies(<138 copies/mL). A negative result must be combined with clinical observations, patient history, and epidemiological information. The expected result is Negative.  Fact Sheet for Patients:  bloggercourse.com  Fact Sheet for Healthcare Providers:  seriousbroker.it  This test is no t yet approved or cleared by the [place]  FDA and  has been authorized for detection and/or diagnosis of SARS-CoV-2 by FDA under an Emergency Use Authorization (EUA). This EUA will remain  in effect (meaning this test can be used) for the duration of the COVID-19 declaration under Section 564(b)(1) of the Act, 21 U.S.C.section 360bbb-3(b)(1), unless the authorization is terminated  or revoked sooner.       Influenza A by PCR NEGATIVE NEGATIVE Final   Influenza B by PCR NEGATIVE NEGATIVE Final    Comment: (NOTE) The Xpert Xpress SARS-CoV-2/FLU/RSV plus assay is intended as an aid in the diagnosis of influenza from Nasopharyngeal swab specimens and should not be used as a sole  basis for treatment. Nasal washings and aspirates are unacceptable for Xpert Xpress SARS-CoV-2/FLU/RSV testing.  Fact Sheet for Patients: bloggercourse.com  Fact Sheet for Healthcare Providers: seriousbroker.it  This test is not yet approved or cleared by the [place]  FDA and has been authorized for detection and/or diagnosis of SARS-CoV-2 by FDA under an Emergency Use Authorization (EUA). This EUA will remain in effect (meaning this test can be used) for the duration of the COVID-19 declaration under Section 564(b)(1) of the Act, 21 U.S.C. section 360bbb-3(b)(1), unless the authorization is terminated or revoked.     Resp Syncytial Virus by PCR NEGATIVE NEGATIVE Final    Comment: (NOTE) Fact Sheet for Patients: bloggercourse.com  Fact Sheet for Healthcare Providers: seriousbroker.it  This test is not yet approved or cleared by the [place]  FDA and  has been authorized for detection and/or diagnosis of SARS-CoV-2 by FDA under an Emergency Use Authorization (EUA). This EUA will remain in effect (meaning this test can be used) for the duration of the COVID-19 declaration under Section 564(b)(1) of the Act, 21 U.S.C. section 360bbb-3(b)(1), unless the authorization is terminated or revoked.  Performed at [facility], [address] [place], [place] [address]   Blood Culture (routine x 2)     Status: None   Collection Time: 10/20/24 12:26 AM   Specimen: BLOOD  Result Value Ref Range Status   Specimen Description BLOOD LEFT UPPER ARM  Final   Special Requests   Final    BOTTLES DRAWN AEROBIC AND ANAEROBIC Blood Culture results may not be optimal due to an inadequate volume of blood received in culture bottles   Culture   Final    NO GROWTH 5 DAYS Performed at [facility], [address] [place], [place] [address]    Report Status 10/25/2024  FINAL  Final  Urine Culture     Status: Abnormal   Collection Time: 10/20/24 12:58 AM   Specimen: Urine, Random  Result Value Ref Range Status   Specimen Description   Final    URINE, RANDOM Performed at [facility], [address] [place], [place] [address]    Special Requests   Final    NONE Reflexed from [id number] Performed at [facility], [address] [place], [place] [address]    Culture (A)  Final    >=100,000 COLONIES/mL PROTEUS MIRABILIS >=100,000 COLONIES/mL ENTEROCOCCUS FAECALIS VANCOMYCIN  RESISTANT ENTEROCOCCUS ISOLATED    Report Status 10/22/2024 FINAL  Final   Organism ID, Bacteria PROTEUS MIRABILIS (A)  Final   Organism ID, Bacteria ENTEROCOCCUS FAECALIS (A)  Final      Susceptibility   Enterococcus faecalis - MIC*    AMPICILLIN <=2 SENSITIVE Sensitive     NITROFURANTOIN <=16 SENSITIVE Sensitive     VANCOMYCIN  >=32 RESISTANT Resistant     LINEZOLID  2 SENSITIVE Sensitive     * >=100,000 COLONIES/mL ENTEROCOCCUS FAECALIS   Proteus mirabilis - MIC*    AMPICILLIN <=2 SENSITIVE Sensitive     CEFAZOLIN  (URINE) Value in next row Sensitive      4 SENSITIVEThis is a modified FDA-approved test that has been validated and its performance characteristics determined by the reporting laboratory.  This laboratory is certified under the Clinical Laboratory Improvement Amendments CLIA as qualified to perform high complexity clinical laboratory testing.    CEFEPIME  Value in next row Sensitive      4 SENSITIVEThis is a modified FDA-approved test that has been validated and its performance characteristics determined by the reporting laboratory.  This laboratory is certified under the Clinical Laboratory Improvement Amendments CLIA as qualified to perform high complexity clinical laboratory testing.    ERTAPENEM Value in next row Sensitive      4 SENSITIVEThis is a modified FDA-approved test that has been validated and its performance characteristics determined by  the reporting laboratory.  This laboratory is certified under the Clinical Laboratory Improvement Amendments CLIA as qualified to perform high complexity clinical laboratory testing.    CEFTRIAXONE  Value in next row Sensitive      4 SENSITIVEThis is a modified FDA-approved test that has been validated and its performance characteristics determined by the reporting laboratory.  This laboratory is certified under the Clinical Laboratory Improvement Amendments CLIA as qualified to perform high complexity clinical laboratory testing.    CIPROFLOXACIN  Value in next row Resistant  4 SENSITIVEThis is a modified FDA-approved test that has been validated and its performance characteristics determined by the reporting laboratory.  This laboratory is certified under the Clinical Laboratory Improvement Amendments CLIA as qualified to perform high complexity clinical laboratory testing.    GENTAMICIN Value in next row Sensitive      4 SENSITIVEThis is a modified FDA-approved test that has been validated and its performance characteristics determined by the reporting laboratory.  This laboratory is certified under the Clinical Laboratory Improvement Amendments CLIA as qualified to perform high complexity clinical laboratory testing.    NITROFURANTOIN Value in next row Resistant      4 SENSITIVEThis is a modified FDA-approved test that has been validated and its performance characteristics determined by the reporting laboratory.  This laboratory is certified under the Clinical Laboratory Improvement Amendments CLIA as qualified to perform high complexity clinical laboratory testing.    TRIMETH /SULFA  Value in next row Sensitive      4 SENSITIVEThis is a modified FDA-approved test that has been validated and its performance characteristics determined by the reporting laboratory.  This laboratory is certified under the Clinical Laboratory Improvement Amendments CLIA as qualified to perform high complexity clinical  laboratory testing.    AMPICILLIN/SULBACTAM Value in next row Sensitive      4 SENSITIVEThis is a modified FDA-approved test that has been validated and its performance characteristics determined by the reporting laboratory.  This laboratory is certified under the Clinical Laboratory Improvement Amendments CLIA as qualified to perform high complexity clinical laboratory testing.    PIP/TAZO Value in next row Sensitive      <=4 SENSITIVEThis is a modified FDA-approved test that has been validated and its performance characteristics determined by the reporting laboratory.  This laboratory is certified under the Clinical Laboratory Improvement Amendments CLIA as qualified to perform high complexity clinical laboratory testing.    MEROPENEM Value in next row Sensitive      <=4 SENSITIVEThis is a modified FDA-approved test that has been validated and its performance characteristics determined by the reporting laboratory.  This laboratory is certified under the Clinical Laboratory Improvement Amendments CLIA as qualified to perform high complexity clinical laboratory testing.    * >=100,000 COLONIES/mL PROTEUS MIRABILIS  Respiratory (~20 pathogens) panel by PCR     Status: None   Collection Time: 10/20/24  4:09 PM   Specimen: Nasopharyngeal Swab; Respiratory  Result Value Ref Range Status   Adenovirus NOT DETECTED NOT DETECTED Final   Coronavirus 229E NOT DETECTED NOT DETECTED Final    Comment: (NOTE) The Coronavirus on the Respiratory Panel, DOES NOT test for the novel  Coronavirus (2019 nCoV)    Coronavirus HKU1 NOT DETECTED NOT DETECTED Final   Coronavirus NL63 NOT DETECTED NOT DETECTED Final   Coronavirus OC43 NOT DETECTED NOT DETECTED Final   Metapneumovirus NOT DETECTED NOT DETECTED Final   Rhinovirus / Enterovirus NOT DETECTED NOT DETECTED Final   Influenza A NOT DETECTED NOT DETECTED Final   Influenza B NOT DETECTED NOT DETECTED Final   Parainfluenza Virus 1 NOT DETECTED NOT DETECTED Final    Parainfluenza Virus 2 NOT DETECTED NOT DETECTED Final   Parainfluenza Virus 3 NOT DETECTED NOT DETECTED Final   Parainfluenza Virus 4 NOT DETECTED NOT DETECTED Final   Respiratory Syncytial Virus NOT DETECTED NOT DETECTED Final   Bordetella pertussis NOT DETECTED NOT DETECTED Final   Bordetella Parapertussis NOT DETECTED NOT DETECTED Final   Chlamydophila pneumoniae NOT DETECTED NOT DETECTED Final   Mycoplasma pneumoniae NOT DETECTED NOT  DETECTED Final    Comment: Performed at [facility], [address] [place], [place] [address]  Body fluid culture w Gram Stain     Status: None   Collection Time: 10/23/24  4:37 PM   Specimen: Lung, Left; Pleural Fluid  Result Value Ref Range Status   Specimen Description   Final    LUNG Performed at [facility], [address] [place], [place] [address]    Special Requests   Final    NONE Performed at [facility], [address] [place], [place] [address]    Gram Stain   Final    FEW WBC PRESENT, PREDOMINANTLY MONONUCLEAR NO ORGANISMS SEEN    Culture   Final    NO GROWTH 3 DAYS Performed at [facility], [address] [place], [place] [address]    Report Status 10/26/2024 FINAL  Final    Coagulation Studies: No results for input(s): LABPROT, INR in the last 72 hours.    Urinalysis: No results for input(s): COLORURINE, LABSPEC, PHURINE, GLUCOSEU, HGBUR, BILIRUBINUR, KETONESUR, PROTEINUR, UROBILINOGEN, NITRITE, LEUKOCYTESUR in the last 72 hours.  Invalid input(s): APPERANCEUR      Imaging: PERIPHERAL VASCULAR CATHETERIZATION Result Date: 10/28/2024 See surgical note for result.    Medications:    vancomycin  1,000 mg (10/26/24 1722)    bisacodyl   10 mg Oral QHS   calcitRIOL   0.5 mcg Oral Daily   calcium  acetate  1,334 mg Oral TID WC   carvedilol   6.25 mg Oral BID WC   Chlorhexidine  Gluconate Cloth  6 each Topical Q0600   diltiazem   240 mg Oral QPM    doxepin   10 mg Oral QHS   epoetin  alfa-epbx (RETACRIT ) injection  4,000 Units Intravenous Q M,W,F-1800   famotidine   10 mg Oral Daily   fluticasone  furoate-vilanterol  1 puff Inhalation Daily   furosemide   60 mg Intravenous BID   gabapentin   300 mg Oral QHS   guaiFENesin   600 mg Oral BID   heparin   5,000 Units Subcutaneous Q12H   insulin  aspart  0-9 Units Subcutaneous TID WC   insulin  aspart  6 Units Subcutaneous TID WC   insulin  glargine-yfgn  35 Units Subcutaneous BID   lactulose   20 g Oral BID   levothyroxine   175 mcg Oral QHS   linaclotide   145 mcg Oral QAC breakfast   loratadine   10 mg Oral Daily   melatonin  10 mg Oral QHS   midodrine   20 mg Oral Once per day on Monday Wednesday Friday   naloxegol  oxalate  12.5 mg Oral Daily   polyethylene glycol  17 g Oral BID   predniSONE   5 mg Oral Q breakfast   senna-docusate  1 tablet Oral BID   simethicone   80 mg Oral QID   sodium chloride  flush  3 mL Intravenous Q12H   SMOG  960 mL Rectal Once   umeclidinium bromide   1 puff Inhalation Daily   acetaminophen , bisacodyl , ipratropium-albuterol , metoCLOPramide  (REGLAN ) injection, ondansetron  (ZOFRAN ) IV, mouth rinse, oxyCODONE , sodium chloride  flush  Assessment/ Plan:  [name] is a 64 y.o.  male with past medical conditions including diabetes, hepatitis C, bilateral BKA, anxiety and depression, and end-stage renal disease on hemodialysis.  Patient presents to the emergency department from his facility complaining of shortness of breath.  Patient is currently admitted for CHF (congestive heart failure) (HCC) [I50.9] Acute respiratory failure with hypoxia (HCC) [J96.01] MRSA bacteremia [R78.81, B95.62] Hypervolemia, unspecified hypervolemia type [E87.70] Sepsis, due  to unspecified organism, unspecified whether acute organ dysfunction present (HCC) [A41.9]  [facility]/MWF/120.5 kg  End-stage renal disease with hyperkalemia on hemodialysis.   Received dialysis yesterday,  UF  achieved. Next treatment scheduled for Wednesday. Will attempt additional fluid removal. Vascular consulted to evaluate permcath, planning exchange later this week.   Anemia of chronic kidney disease Lab Results  Component Value Date   HGB 8.8 (L) 10/28/2024   Hemoglobin 8.6, continue low-dose IV Retacrit  4000 units with dialysis.  3. Secondary Hyperparathyroidism: with outpatient labs: None available  Lab Results  Component Value Date   PTH 177 (H) 04/23/2024   CALCIUM  8.1 (L) 10/28/2024   PHOS 5.7 (H) 10/26/2024   Hyperphosphatemia has improved during this admission. Continue calcium  acetate 1334 mg with meals.      LOS: 8 Mariaeduarda Defranco 2/4/20263:52 PM   "

## 2024-10-28 NOTE — Progress Notes (Signed)
" °   10/28/24 1656  Vitals  Temp 97.9 F (36.6 C)  BP (!) 141/73  MAP (mmHg) 93  Pulse Rate 69  ECG Heart Rate 70  Resp 20  Weight 120.1 kg  Type of Weight Post-Dialysis  Oxygen Therapy  SpO2 100 %  O2 Device Nasal Cannula  Patient Activity (if Appropriate) In bed  Pulse Oximetry Type Continuous  Oximetry Probe Site Changed No  During Treatment Monitoring  Blood Flow Rate (mL/min) 0 mL/min  Arterial Pressure (mmHg) -60.2 mmHg  Venous Pressure (mmHg) 107.47 mmHg  TMP (mmHg) 17.77 mmHg  Ultrafiltration Rate (mL/min) 531 mL/min  Dialysate Flow Rate (mL/min) 300 ml/min  Dialysate Potassium Concentration 1  Dialysate Calcium  Concentration 2.5  Duration of HD Treatment -hour(s) 3.42 hour(s)  Cumulative Fluid Removed (mL) per Treatment  3000.13  Post Treatment  Dialyzer Clearance Lightly streaked  Hemodialysis Intake (mL) 0 mL  Liters Processed 47.9  Fluid Removed (mL) 3000 mL  Tolerated HD Treatment Yes  Post-Hemodialysis Comments Goal has been met. Vs have remained stable. Access works well. Patient received Vanc and reticrit. Access is clean, dry and intact. No verbalized concerns post tx. BFR 250 due to elevated pressures for newly placed catheter.  AVG/AVF Arterial Site Held (minutes) 0 minutes  AVG/AVF Venous Site Held (minutes) 0 minutes    "

## 2024-10-28 NOTE — Plan of Care (Signed)

## 2024-10-28 NOTE — Progress Notes (Signed)
 Patient back from dialysis. Skin assessed. Patient rolled independently without assistance. Stage 1 injury noted to buttock. Cleaned and barrier cream applied. New foam dressing applied.  New picture and measurements added to chart.

## 2024-10-28 NOTE — Interval H&P Note (Signed)
 History and Physical Interval Note:  10/28/2024 9:06 AM  Eugene [name]  has presented today for surgery, with the diagnosis of esrd, poorly functioning permcath.  The various methods of treatment have been discussed with the patient and family. After consideration of risks, benefits and other options for treatment, the patient has consented to  Procedures: DIALYSIS/PERMA CATHETER INSERTION (Left) DOIS as a surgical intervention.  The patient's history has been reviewed, patient examined, no change in status, stable for surgery.  I have reviewed the patient's chart and labs.  Questions were answered to the patient's satisfaction.     [name]

## 2024-10-28 NOTE — Progress Notes (Signed)
 " PROGRESS NOTE    Eugene [name]  FMW:[id number] DOB: 27-Mar-1961 DOA: 10/19/2024 PCP: [facility]  Chief Complaint  Patient presents with   Shortness of Breath    Hospital Course:  Eugene [name] is a 64 year old male with extensive past medical history including osteomyelitis, spinal stenosis, IBS, dyslipidemia, HCV, ESRD, diabetes, depression, anxiety, MRSA bacteremia status post vancomycin , chronic hypoxia, bilateral BKA, chronic diastolic heart failure.  He presents to the hospital due to worsening hypoxemia requiring 15 L upon arrival in the ED.  He was found to be febrile with worsening hemoglobin and renal function.  CXR revealed bilateral multifocal pneumonia.  Patient underwent hemodialysis on 1/29 but had noted obstruction of both ports treated with Cathflo.  He was started on MetaNebs though he did not tolerate it.  Moderate left effusion seen on CT chest.  Thoracentesis was ordered.  He was started on antibiotics. 1/30 he underwent thoracentesis for diagnostic purposes but was unable to have therapeutic removal of fluid due to body habitus.  Kae revealed likely simple effusion due to ESRD.  Has made minimal improvement in oxygenation status.  Stay has been further complicated by refusing to do chest physiotherapy.  Pulmonology has been assisting.  Subjective: Patient seen right after dialysis.  He reports he is ready to discharge home.  He is presently on 10 L of oxygen.  I discussed with him that it is 5:30 PM and we should monitor overnight now that he has received dialysis and titrate medications.  He is frustrated by this plan and reports he will be leaving back to [facility] in the morning   Objective: Vitals:   10/28/24 1530 10/28/24 1600 10/28/24 1630 10/28/24 1655  BP: (!) 135/58 130/67 138/73 134/81  Pulse: 73 73 71 70  Resp: 16 19 20 18   Temp:      TempSrc:      SpO2: 100% 100% 100% 100%  Weight:        Intake/Output Summary (Last 24 hours)  at 10/28/2024 1700 Last data filed at 10/28/2024 0804 Gross per 24 hour  Intake 720 ml  Output 1700 ml  Net -980 ml   Filed Weights   10/27/24 0500 10/28/24 0411 10/28/24 1258  Weight: 125 kg 125.3 kg 123.4 kg    Examination: General exam: Appears calm and comfortable, NAD  Respiratory system: No work of breathing, symmetric chest wall expansion Cardiovascular system: S1 & S2 heard, RRR.  Gastrointestinal system: Abdomen is nondistended, soft and nontender.  Neuro: Alert and oriented.   Assessment & Plan:  Principal Problem:   Acute respiratory failure with hypoxia (HCC) Active Problems:   CHF (congestive heart failure) (HCC)    Acute on chronic hypoxic respiratory failure Acute on chronic heart failure with preserved EF, decompensation - Secondary to volume overload and COPD exacerbation - Receiving emergency hemodialysis - Did have fever and CXR with bilateral infiltrates most compatible with pulmonary congestion.  Atypical pneumonia cannot be ruled out.  Has been covered with doxycycline  - RVP negative - Pulmonology consulted - 1/29 CT chest: Improving pulmonary edema, bilateral lower lobe atelectasis, pleural effusion right, mild, moderate left - 1/30 left thoracentesis, could not find adequate window to drain entirety of fluid.  15 mL diagnostic fluid obtained - Status post Solu-Medrol  x 2, continue tapering - Patient reports he is chronically on 8 L at home, resides at [facility]  COPD exacerbation - On Breo Ellipta  and Incruse Ellipta  - As needed DuoNebs - Steroid therapy - Pulmonology following  Chronic pain - Continue with oxycodone   UTI - Urine culture with Enterococcus and Proteus - Initially on vancomycin , 1/29 changed to Zosyn  to cover for VRE  Chronic constipation - Failed laxative trials.  On naloxegol  - Titrate laxatives into 1-2 bowel movements daily  Hyperkalemia, resolved - Monitor daily labs.  HD as needed  MRSA bacteremia - Meant  to be on vancomycin  MWF, last dose November 11, 2024  Insulin -dependent type 2 diabetes - Continue basal/bolus, titrate as needed. - Worsening hyperglycemia secondary to steroids  ESRD on HD - Nephrology consulted as above  Malfunctioning dialysis catheter - PermCath replaced today with vascular surgery.  New TDC placed in left IJ.  Receiving HD now  History of paroxysmal SVT - Continue home meds Cardizem  and Coreg   Body mass index is 39.03 kg/m. Obesity Class II - Outpatient follow up for lifestyle modification and risk factor management   DVT prophylaxis: Heparin    Code Status: Limited: Do not attempt resuscitation (DNR) -DNR-LIMITED -Do Not Intubate/DNI  Disposition:  Inpt pending clinical resolution - may be able to DC in next 48hr  Consultants:  Treatment Team:  Consulting Physician: Parris Manna, MD  Procedures:    Antimicrobials:  Anti-infectives (From admission, onward)    Start     Dose/Rate Route Frequency Ordered Stop   10/28/24 0930  ceFAZolin  (ANCEF ) IVPB 1 g/50 mL premix  Status:  Discontinued        1 g 100 mL/hr over 30 Minutes Intravenous  Once 10/28/24 0918 10/28/24 1108   10/28/24 0806  ceFAZolin  (ANCEF ) IVPB 2g/100 mL premix  Status:  Discontinued        2 g 200 mL/hr over 30 Minutes Intravenous 30 min pre-op 10/28/24 0806 10/28/24 0918   10/26/24 1400  vancomycin  (VANCOCIN ) IVPB 1000 mg/200 mL premix        1,000 mg 200 mL/hr over 60 Minutes Intravenous Every M-W-F (Hemodialysis) 10/23/24 1109     10/23/24 1800  vancomycin  (VANCOCIN ) IVPB 1000 mg/200 mL premix  Status:  Discontinued        1,000 mg 200 mL/hr over 60 Minutes Intravenous Every M-W-F (Hemodialysis) 10/21/24 1116 10/23/24 1109   10/23/24 1800  vancomycin  (VANCOREADY) IVPB 750 mg/150 mL  Status:  Discontinued        750 mg 150 mL/hr over 60 Minutes Intravenous  Once 10/23/24 1109 10/26/24 1303   10/22/24 2200  doxycycline  (VIBRA -TABS) tablet 100 mg        100 mg Oral Every 12  hours 10/22/24 1426 10/26/24 2227   10/22/24 1400  vancomycin  (VANCOCIN ) IVPB 1000 mg/200 mL premix        1,000 mg 200 mL/hr over 60 Minutes Intravenous  Once 10/22/24 1257 10/22/24 1641   10/22/24 0815  piperacillin -tazobactam (ZOSYN ) IVPB 2.25 g        2.25 g 100 mL/hr over 30 Minutes Intravenous Every 8 hours 10/22/24 0804 10/27/24 0559   10/21/24 2200  ceFEPIme  (MAXIPIME ) 1 g in sodium chloride  0.9 % 100 mL IVPB  Status:  Discontinued        1 g 200 mL/hr over 30 Minutes Intravenous Every 24 hours 10/21/24 0918 10/22/24 0804   10/21/24 1200  vancomycin  (VANCOCIN ) IVPB 1000 mg/200 mL premix  Status:  Discontinued        1,000 mg 200 mL/hr over 60 Minutes Intravenous Every M-W-F (Hemodialysis) 10/20/24 1236 10/21/24 1116   10/21/24 1100  ceFEPIme  (MAXIPIME ) 1 g in sodium chloride  0.9 % 100 mL IVPB  1 g 200 mL/hr over 30 Minutes Intravenous  Once 10/21/24 1006 10/21/24 1304   10/21/24 1000  vancomycin  IVPB  Status:  Discontinued       Note to Pharmacy: Vancomycin  1000mg  IV MWF with hemodialysis Indication:  MRSA bacteremia Last Day of Therapy:  11/11/2024 Labs - Monday:  CBC/D, CMP, and pre-HD vancomycin  level Fax weekly lab results  promptly to [phone] Call [phone] with questions or critical value To be given at HD center     1,000 mg Intravenous Every M-W-F 10/20/24 0847 10/20/24 1236   10/20/24 1000  doxycycline  (VIBRAMYCIN ) 100 mg in sodium chloride  0.9 % 250 mL IVPB  Status:  Discontinued        100 mg 125 mL/hr over 120 Minutes Intravenous Every 12 hours 10/20/24 0900 10/22/24 1426   10/20/24 0045  vancomycin  (VANCOCIN ) IVPB 1000 mg/200 mL premix        1,000 mg 200 mL/hr over 60 Minutes Intravenous  Once 10/20/24 0031 10/20/24 0244   10/20/24 0030  ceFEPIme  (MAXIPIME ) 2 g in sodium chloride  0.9 % 100 mL IVPB  Status:  Discontinued        2 g 200 mL/hr over 30 Minutes Intravenous  Once 10/20/24 0023 10/20/24 0025   10/20/24 0030  vancomycin  (VANCOCIN ) IVPB  1000 mg/200 mL premix  Status:  Discontinued       Placed in Followed by Linked Group   1,000 mg 200 mL/hr over 60 Minutes Intravenous  Once 10/20/24 0023 10/20/24 0031   10/20/24 0030  vancomycin  (VANCOREADY) IVPB 1500 mg/300 mL  Status:  Discontinued       Placed in Followed by Linked Group   1,500 mg 150 mL/hr over 120 Minutes Intravenous  Once 10/20/24 0023 10/20/24 0031   10/20/24 0030  ceFEPIme  (MAXIPIME ) 1 g in sodium chloride  0.9 % 100 mL IVPB        1 g 200 mL/hr over 30 Minutes Intravenous  Once 10/20/24 0025 10/20/24 0125       Data Reviewed: I have personally reviewed following labs and imaging studies CBC: Recent Labs  Lab 10/22/24 0950 10/23/24 0302 10/25/24 0458 10/26/24 0742 10/28/24 0420  WBC 7.2 7.5 5.4 10.5 9.2  NEUTROABS  --   --   --  8.3*  --   HGB 7.8* 8.0* 8.5* 8.6* 8.8*  HCT 24.9* 26.1* 26.7* 26.8* 28.0*  MCV 96.5 96.3 92.1 94.0 95.9  PLT 190 203 195 201 175   Basic Metabolic Panel: Recent Labs  Lab 10/22/24 0950 10/23/24 0302 10/25/24 0458 10/25/24 0829 10/26/24 0742 10/26/24 1050 10/28/24 0420  NA 131* 128* 130*  --  131*  --  131*  K 5.8* 5.4* 5.3*  --  5.8*  --  6.0*  CL 94* 94* 93*  --  94*  --  94*  CO2 26 26 25   --  26  --  26  GLUCOSE 310* 282* 466* 424* 478* 461* 436*  BUN 65* 48* 47*  --  61*  --  63*  CREATININE 5.97* 4.51* 4.11*  --  4.73*  --  4.41*  CALCIUM  7.6* 7.8* 8.5*  --  8.6*  --  8.1*  MG 2.0  --  2.0  --   --   --   --   PHOS 7.6*  --   --   --  5.7*  --   --    GFR: Estimated Creatinine Clearance: 23.2 mL/min (A) (by C-G formula based on SCr of 4.41 mg/dL (  H)). Liver Function Tests: Recent Labs  Lab 10/26/24 0742  ALBUMIN  3.0*   CBG: Recent Labs  Lab 10/27/24 1651 10/27/24 2103 10/28/24 0804 10/28/24 0859 10/28/24 1056  GLUCAP 364* 392* 385* 332* 328*    Recent Results (from the past 240 hours)  Blood Culture (routine x 2)     Status: None   Collection Time: 10/19/24 11:53 PM   Specimen:  BLOOD  Result Value Ref Range Status   Specimen Description BLOOD RIGHT UPPER ARM  Final   Special Requests   Final    BOTTLES DRAWN AEROBIC AND ANAEROBIC Blood Culture results may not be optimal due to an inadequate volume of blood received in culture bottles   Culture   Final    NO GROWTH 5 DAYS Performed at [facility], [address] [place], [place] [address]    Report Status 10/25/2024 FINAL  Final  Resp panel by RT-PCR (RSV, Flu A&B, Covid) Anterior Nasal Swab     Status: None   Collection Time: 10/19/24 11:53 PM   Specimen: Anterior Nasal Swab  Result Value Ref Range Status   SARS Coronavirus 2 by RT PCR NEGATIVE NEGATIVE Final    Comment: (NOTE) SARS-CoV-2 target nucleic acids are NOT DETECTED.  The SARS-CoV-2 RNA is generally detectable in upper respiratory specimens during the acute phase of infection. The lowest concentration of SARS-CoV-2 viral copies this assay can detect is 138 copies/mL. A negative result does not preclude SARS-Cov-2 infection and should not be used as the sole basis for treatment or other patient management decisions. A negative result may occur with  improper specimen collection/handling, submission of specimen other than nasopharyngeal swab, presence of viral mutation(s) within the areas targeted by this assay, and inadequate number of viral copies(<138 copies/mL). A negative result must be combined with clinical observations, patient history, and epidemiological information. The expected result is Negative.  Fact Sheet for Patients:  bloggercourse.com  Fact Sheet for Healthcare Providers:  seriousbroker.it  This test is no t yet approved or cleared by the [place]  FDA and  has been authorized for detection and/or diagnosis of SARS-CoV-2 by FDA under an Emergency Use Authorization (EUA). This EUA will remain  in effect (meaning this test can be used) for the duration of  the COVID-19 declaration under Section 564(b)(1) of the Act, 21 U.S.C.section 360bbb-3(b)(1), unless the authorization is terminated  or revoked sooner.       Influenza A by PCR NEGATIVE NEGATIVE Final   Influenza B by PCR NEGATIVE NEGATIVE Final    Comment: (NOTE) The Xpert Xpress SARS-CoV-2/FLU/RSV plus assay is intended as an aid in the diagnosis of influenza from Nasopharyngeal swab specimens and should not be used as a sole basis for treatment. Nasal washings and aspirates are unacceptable for Xpert Xpress SARS-CoV-2/FLU/RSV testing.  Fact Sheet for Patients: bloggercourse.com  Fact Sheet for Healthcare Providers: seriousbroker.it  This test is not yet approved or cleared by the [place]  FDA and has been authorized for detection and/or diagnosis of SARS-CoV-2 by FDA under an Emergency Use Authorization (EUA). This EUA will remain in effect (meaning this test can be used) for the duration of the COVID-19 declaration under Section 564(b)(1) of the Act, 21 U.S.C. section 360bbb-3(b)(1), unless the authorization is terminated or revoked.     Resp Syncytial Virus by PCR NEGATIVE NEGATIVE Final    Comment: (NOTE) Fact Sheet for Patients: bloggercourse.com  Fact Sheet for Healthcare Providers: seriousbroker.it  This test is not yet approved or cleared by  the [place]  FDA and has been authorized for detection and/or diagnosis of SARS-CoV-2 by FDA under an Emergency Use Authorization (EUA). This EUA will remain in effect (meaning this test can be used) for the duration of the COVID-19 declaration under Section 564(b)(1) of the Act, 21 U.S.C. section 360bbb-3(b)(1), unless the authorization is terminated or revoked.  Performed at [facility], [address] [place], [place] [address]   Blood Culture (routine x 2)     Status: None   Collection Time:  10/20/24 12:26 AM   Specimen: BLOOD  Result Value Ref Range Status   Specimen Description BLOOD LEFT UPPER ARM  Final   Special Requests   Final    BOTTLES DRAWN AEROBIC AND ANAEROBIC Blood Culture results may not be optimal due to an inadequate volume of blood received in culture bottles   Culture   Final    NO GROWTH 5 DAYS Performed at [facility], [address] [place], [place] [address]    Report Status 10/25/2024 FINAL  Final  Urine Culture     Status: Abnormal   Collection Time: 10/20/24 12:58 AM   Specimen: Urine, Random  Result Value Ref Range Status   Specimen Description   Final    URINE, RANDOM Performed at [facility], [address], [place], [place] [address]    Special Requests   Final    NONE Reflexed from [id number] Performed at [facility], [address] [place], [place] [address]    Culture (A)  Final    >=100,000 COLONIES/mL PROTEUS MIRABILIS >=100,000 COLONIES/mL ENTEROCOCCUS FAECALIS VANCOMYCIN  RESISTANT ENTEROCOCCUS ISOLATED    Report Status 10/22/2024 FINAL  Final   Organism ID, Bacteria PROTEUS MIRABILIS (A)  Final   Organism ID, Bacteria ENTEROCOCCUS FAECALIS (A)  Final      Susceptibility   Enterococcus faecalis - MIC*    AMPICILLIN <=2 SENSITIVE Sensitive     NITROFURANTOIN <=16 SENSITIVE Sensitive     VANCOMYCIN  >=32 RESISTANT Resistant     LINEZOLID  2 SENSITIVE Sensitive     * >=100,000 COLONIES/mL ENTEROCOCCUS FAECALIS   Proteus mirabilis - MIC*    AMPICILLIN <=2 SENSITIVE Sensitive     CEFAZOLIN  (URINE) Value in next row Sensitive      4 SENSITIVEThis is a modified FDA-approved test that has been validated and its performance characteristics determined by the reporting laboratory.  This laboratory is certified under the Clinical Laboratory Improvement Amendments CLIA as qualified to perform high complexity clinical laboratory testing.    CEFEPIME  Value in next row Sensitive      4 SENSITIVEThis is a modified  FDA-approved test that has been validated and its performance characteristics determined by the reporting laboratory.  This laboratory is certified under the Clinical Laboratory Improvement Amendments CLIA as qualified to perform high complexity clinical laboratory testing.    ERTAPENEM Value in next row Sensitive      4 SENSITIVEThis is a modified FDA-approved test that has been validated and its performance characteristics determined by the reporting laboratory.  This laboratory is certified under the Clinical Laboratory Improvement Amendments CLIA as qualified to perform high complexity clinical laboratory testing.    CEFTRIAXONE  Value in next row Sensitive      4 SENSITIVEThis is a modified FDA-approved test that has been validated and its performance characteristics determined by the reporting laboratory.  This laboratory is certified under the Clinical Laboratory Improvement Amendments CLIA as qualified to perform high complexity clinical laboratory testing.    CIPROFLOXACIN   Value in next row Resistant      4 SENSITIVEThis is a modified FDA-approved test that has been validated and its performance characteristics determined by the reporting laboratory.  This laboratory is certified under the Clinical Laboratory Improvement Amendments CLIA as qualified to perform high complexity clinical laboratory testing.    GENTAMICIN Value in next row Sensitive      4 SENSITIVEThis is a modified FDA-approved test that has been validated and its performance characteristics determined by the reporting laboratory.  This laboratory is certified under the Clinical Laboratory Improvement Amendments CLIA as qualified to perform high complexity clinical laboratory testing.    NITROFURANTOIN Value in next row Resistant      4 SENSITIVEThis is a modified FDA-approved test that has been validated and its performance characteristics determined by the reporting laboratory.  This laboratory is certified under the Clinical  Laboratory Improvement Amendments CLIA as qualified to perform high complexity clinical laboratory testing.    TRIMETH /SULFA  Value in next row Sensitive      4 SENSITIVEThis is a modified FDA-approved test that has been validated and its performance characteristics determined by the reporting laboratory.  This laboratory is certified under the Clinical Laboratory Improvement Amendments CLIA as qualified to perform high complexity clinical laboratory testing.    AMPICILLIN/SULBACTAM Value in next row Sensitive      4 SENSITIVEThis is a modified FDA-approved test that has been validated and its performance characteristics determined by the reporting laboratory.  This laboratory is certified under the Clinical Laboratory Improvement Amendments CLIA as qualified to perform high complexity clinical laboratory testing.    PIP/TAZO Value in next row Sensitive      <=4 SENSITIVEThis is a modified FDA-approved test that has been validated and its performance characteristics determined by the reporting laboratory.  This laboratory is certified under the Clinical Laboratory Improvement Amendments CLIA as qualified to perform high complexity clinical laboratory testing.    MEROPENEM Value in next row Sensitive      <=4 SENSITIVEThis is a modified FDA-approved test that has been validated and its performance characteristics determined by the reporting laboratory.  This laboratory is certified under the Clinical Laboratory Improvement Amendments CLIA as qualified to perform high complexity clinical laboratory testing.    * >=100,000 COLONIES/mL PROTEUS MIRABILIS  Respiratory (~20 pathogens) panel by PCR     Status: None   Collection Time: 10/20/24  4:09 PM   Specimen: Nasopharyngeal Swab; Respiratory  Result Value Ref Range Status   Adenovirus NOT DETECTED NOT DETECTED Final   Coronavirus 229E NOT DETECTED NOT DETECTED Final    Comment: (NOTE) The Coronavirus on the Respiratory Panel, DOES NOT test for the novel   Coronavirus (2019 nCoV)    Coronavirus HKU1 NOT DETECTED NOT DETECTED Final   Coronavirus NL63 NOT DETECTED NOT DETECTED Final   Coronavirus OC43 NOT DETECTED NOT DETECTED Final   Metapneumovirus NOT DETECTED NOT DETECTED Final   Rhinovirus / Enterovirus NOT DETECTED NOT DETECTED Final   Influenza A NOT DETECTED NOT DETECTED Final   Influenza B NOT DETECTED NOT DETECTED Final   Parainfluenza Virus 1 NOT DETECTED NOT DETECTED Final   Parainfluenza Virus 2 NOT DETECTED NOT DETECTED Final   Parainfluenza Virus 3 NOT DETECTED NOT DETECTED Final   Parainfluenza Virus 4 NOT DETECTED NOT DETECTED Final   Respiratory Syncytial Virus NOT DETECTED NOT DETECTED Final   Bordetella pertussis NOT DETECTED NOT DETECTED Final   Bordetella Parapertussis NOT DETECTED NOT DETECTED Final   Chlamydophila pneumoniae NOT DETECTED  NOT DETECTED Final   Mycoplasma pneumoniae NOT DETECTED NOT DETECTED Final    Comment: Performed at [facility], [address], [place], [place] [address]  Body fluid culture w Gram Stain     Status: None   Collection Time: 10/23/24  4:37 PM   Specimen: Lung, Left; Pleural Fluid  Result Value Ref Range Status   Specimen Description   Final    LUNG Performed at [facility], [address] [place], [place] [address]    Special Requests   Final    NONE Performed at [facility], [address] [place], [place] [address]    Gram Stain   Final    FEW WBC PRESENT, PREDOMINANTLY MONONUCLEAR NO ORGANISMS SEEN    Culture   Final    NO GROWTH 3 DAYS Performed at [facility], [address] [place], [place] [address]    Report Status 10/26/2024 FINAL  Final     Radiology Studies: PERIPHERAL VASCULAR CATHETERIZATION Result Date: 10/28/2024 See surgical note for result.   Scheduled Meds:  bisacodyl   10 mg Oral QHS   calcitRIOL   0.5 mcg Oral Daily   calcium  acetate  1,334 mg Oral TID WC   carvedilol   6.25 mg Oral BID WC   Chlorhexidine   Gluconate Cloth  6 each Topical Q0600   diltiazem   240 mg Oral QPM   doxepin   10 mg Oral QHS   epoetin  alfa-epbx (RETACRIT ) injection  4,000 Units Intravenous Q M,W,F-1800   famotidine   10 mg Oral Daily   fluticasone  furoate-vilanterol  1 puff Inhalation Daily   furosemide   60 mg Intravenous BID   gabapentin   300 mg Oral QHS   guaiFENesin   600 mg Oral BID   heparin   5,000 Units Subcutaneous Q12H   insulin  aspart  0-9 Units Subcutaneous TID WC   insulin  aspart  6 Units Subcutaneous TID WC   insulin  glargine-yfgn  35 Units Subcutaneous BID   lactulose   20 g Oral BID   levothyroxine   175 mcg Oral QHS   linaclotide   145 mcg Oral QAC breakfast   loratadine   10 mg Oral Daily   melatonin  10 mg Oral QHS   midodrine   20 mg Oral Once per day on Monday Wednesday Friday   naloxegol  oxalate  12.5 mg Oral Daily   polyethylene glycol  17 g Oral BID   predniSONE   5 mg Oral Q breakfast   senna-docusate  1 tablet Oral BID   simethicone   80 mg Oral QID   sodium chloride  flush  3 mL Intravenous Q12H   SMOG  960 mL Rectal Once   umeclidinium bromide   1 puff Inhalation Daily   Continuous Infusions:  vancomycin  1,000 mg (10/28/24 1559)     LOS: 8 days  MDM: Patient is high risk for one or more organ failure.  They necessitate ongoing hospitalization for continued IV therapies and subsequent lab monitoring. Total time spent interpreting labs and vitals, reviewing the medical record, coordinating care amongst consultants and care team members, directly assessing and discussing care with the patient and/or family: 55 min  Takeysha Bonk, DO [facility]  To contact the attending physician between 7A-7P please use Epic Chat. To contact the covering physician during after hours 7P-7A, please review Amion.  10/28/2024, 5:00 PM   *This document has been created with the assistance of dictation software. Please excuse typographical errors. *   "

## 2024-10-28 NOTE — Op Note (Signed)
 OPERATIVE NOTE    PRE-OPERATIVE DIAGNOSIS: 1. ESRD 2. Non-functional permcath  POST-OPERATIVE DIAGNOSIS: same as above  PROCEDURE: Fluoroscopic guidance for placement of catheter Placement of a 28 cm tip to cuff tunneled hemodialysis catheter via the left internal jugular vein and removal of previous catheter  SURGEON: Selinda Gu, MD  ANESTHESIA:  Local with moderate conscious sedation for 21 minutes using 2 mg of Versed  and 50 mcg of Fentanyl   ESTIMATED BLOOD LOSS: 5 cc  FINDING(S): none  SPECIMEN(S):  None  INDICATIONS:   Patient is a 64 y.o.male who presents with non-functional dialysis catheter and ESRD.  The patient needs long term dialysis access for their ESRD, and a Permcath is necessary.  Risks and benefits are discussed and informed consent is obtained.    DESCRIPTION: After obtaining full informed written consent, the patient was brought back to the vascular suite. The patient received moderate conscious sedation during a face-to-face encounter with me present throughout the entire procedure and supervising the RN monitoring the vital signs, pulse oximetry, telemetry, and mental status throughout the entire procedure. The patient's existing catheter, left neck and chest were sterilely prepped and draped in a sterile surgical field was created.  The existing catheter was dissected free from the fibrous sheath securing the cuff with hemostats and blunt dissection.  A wire was placed. The existing catheter was then removed and the wire used to keep venous access. I selected a 28 cm tip to cuff tunneled dialysis catheter.  Using fluoroscopic guidance the catheter tips were parked in the superior vena cava just above the right atrium.  Attempts to have this to traverse further were unsuccessful due to the marked tortuosity and we did not have another catheter length short of a 44 cm that would have been appropriate for this left sided approach as it was far too long.  At this  location it did withdraw quite easily and flushed easily with heparinized saline. The appropriate distal connectors were placed. It withdrew blood well and flushed easily with heparinized saline and a concentrated heparin  solution was then placed. It was secured to the chest wall with 2 Prolene sutures. A 4-0 Monocryl pursestring suture was placed around the exit site. Sterile dressings were placed. The patient tolerated the procedure well and was taken to the recovery room in stable condition.  COMPLICATIONS: None  CONDITION: Stable  Selinda Gu 10/28/2024 9:40 AM   This note was created with Dragon Medical transcription system. Any errors in dictation are purely unintentional.

## 2024-10-28 NOTE — Inpatient Diabetes Management (Signed)
 Inpatient Diabetes Program Recommendations  AACE/ADA: New Consensus Statement on Inpatient Glycemic Control (2015)  Target Ranges:  Prepandial:   less than 140 mg/dL      Peak postprandial:   less than 180 mg/dL (1-2 hours)      Critically ill patients:  140 - 180 mg/dL    Latest Reference Range & Units 10/27/24 08:21 10/27/24 13:38 10/27/24 16:51 10/27/24 21:03  Glucose-Capillary 70 - 99 mg/dL 733 (H)  5 units Novolog   30 units Semglee   320 (H)  7 units Novolog   364 (H)  9 units Novolog   392 (H)     30 units Semglee   (H): Data is abnormally high  Latest Reference Range & Units 10/28/24 08:04  Glucose-Capillary 70 - 99 mg/dL 614 (H)  (H): Data is abnormally high   SNF Meds: Semglee  20 units BID         Humalog  0-15 QID per SSI     Current Orders: Semglee  30 units BID                             Novolog  0-9 units TID                            Prednisone  5 mg daily   MD- Please consider:  1. Increase Semglee  35 units BID  2. Start Novolog  Meal Coverage: Novolog  6 units TID with meals HOLD if pt NPO HOLD if pt eats <50% meals   --Will follow patient during hospitalization--  Adina Rudolpho Arrow RN, MSN, CDCES Diabetes Coordinator Inpatient Glycemic Control Team Team Pager: [phone] (8a-5p)

## 2024-10-28 NOTE — Progress Notes (Signed)
 Patient off unit to dialysis.

## 2024-10-28 NOTE — Progress Notes (Signed)
 "    PULMONOLOGY         Date: 10/28/2024,   MRN# [id number] Eugene [name] April 04, 1961     AdmissionWeight: 124.5 kg                 CurrentWeight: 125.3 kg  Referring provider: Dr Von   CHIEF COMPLAINT:   Acute hypoxemic respiratory failure    HISTORY OF PRESENT ILLNESS   This is a 64 year old male DNR CODE STATUS history of osteomyelitis, spinal stenosis, IBS, dyslipidemia HCV, ESRD diabetes depression anxiety, recent diagnosis of MRSA bacteremia completed vancomycin  at baseline is hypoxemic with requirement of supplemental oxygen at 6 L/min, has bilateral BKA, chronic diastolic heart failure, recently completed negative endocarditis workup.  Came into the hospital due to worsening hypoxemia requiring 15 L of supplemental O2 on arrival in the ER.  He was found to be febrile with worsening hemoglobin and renal function.  Chest x-ray showed bilateral multifocal pneumonia.  Despite maximal medical management patient continues to have significant hypoxemia with difficulty weaning oxygen requirement.  PCCM consultation for further evaluation management.  10/22/24- patient s/p HD today with noted obstruction of both ports treated with cathflo. He did not tolerate metaneb therapy and asked to RT to discontinue the treatments.  He is negative 3L fluid balance.  He is on 10L/min HFNC. Moderate left effusion on CT chest is noted.   10/24/27- Patient with no overnight events.  His bloodwork shows improved hemoglobin.  He is still on elevated settings with HFNC.  I've ordered thoracentesis for left moderate pleural effusion.  Patient is on doxy/zosyn .  He's on low dose prednisone .   10/24/24- patient on HFNC 50%, he reports breathing is not better. He had diagnostic thoracentesis.  So far gram stain and culture is negative.  He is in no distress.  He was not able to have therpeutic removal of fluid with thoracentesis due to body habitus.  He's eating breakfast well and speaking in full sentences  without struggling.  He has ESRD with fluid/edema everywhere. Overall prognosis is poor. Im repeating CXR for interval changes on edema/effusion.  He remains on lasix  60 bid with HD and doxy bid  10/27/24- patient laying in bed in no acute distress.  He continues to have mild to moderate dyspnea. He had thoracentesis done to evaluate effusion. He is on lasix  60 bid and Vancomycin  with dialysis.  He is on 4L/min [place]  PAST MEDICAL HISTORY   Past Medical History:  Diagnosis Date   Anxiety    Depression    Diabetes mellitus without complication (HCC)    End stage renal disease (HCC)    Hepatitis C    Hyperlipidemia    IBS (irritable bowel syndrome)    Osteomyelitis (HCC)    Spinal stenosis      SURGICAL HISTORY   Past Surgical History:  Procedure Laterality Date   BACK SURGERY     bilateral amputation Bilateral    BKA Bilateral    CENTRAL LINE INSERTION-TUNNELED N/A 02/02/2019   Procedure: CENTRAL LINE INSERTION-TUNNELED;  Surgeon: Marea Selinda RAMAN, MD;  Location: [facility] INVASIVE CV LAB;  Service: Cardiovascular;  Laterality: N/A;   COLONOSCOPY WITH PROPOFOL  N/A 01/12/2020   Procedure: COLONOSCOPY WITH PROPOFOL ;  Surgeon: Jinny Carmine, MD;  Location: [facility] ENDOSCOPY;  Service: Endoscopy;  Laterality: N/A;   DIALYSIS/PERMA CATHETER INSERTION N/A 05/22/2024   Procedure: DIALYSIS/PERMA CATHETER INSERTION;  Surgeon: Jama Cordella MATSU, MD;  Location: [facility] INVASIVE CV LAB;  Service: Cardiovascular;  Laterality: N/A;  DIALYSIS/PERMA CATHETER INSERTION N/A 07/21/2024   Procedure: DIALYSIS/PERMA CATHETER INSERTION;  Surgeon: Marea Selinda RAMAN, MD;  Location: [facility] INVASIVE CV LAB;  Service: Cardiovascular;  Laterality: N/A;   DIALYSIS/PERMA CATHETER INSERTION N/A 10/05/2024   Procedure: DIALYSIS/PERMA CATHETER INSERTION;  Surgeon: Marea Selinda RAMAN, MD;  Location: [facility] INVASIVE CV LAB;  Service: Cardiovascular;  Laterality: N/A;   DIALYSIS/PERMA CATHETER REMOVAL N/A 10/01/2024   Procedure: DIALYSIS/PERMA CATHETER  REMOVAL;  Surgeon: Marea Selinda RAMAN, MD;  Location: [facility] INVASIVE CV LAB;  Service: Cardiovascular;  Laterality: N/A;   ESOPHAGOGASTRODUODENOSCOPY (EGD) WITH PROPOFOL  N/A 12/07/2019   Procedure: ESOPHAGOGASTRODUODENOSCOPY (EGD) WITH PROPOFOL ;  Surgeon: Unk Corinn Skiff, MD;  Location: [facility] ENDOSCOPY;  Service: Gastroenterology;  Laterality: N/A;   IR CATHETER TUBE CHANGE  12/04/2019   SPINAL FUSION     THORACIC SPINE SURGERY  01/2019   extensive washout     FAMILY HISTORY   Family History  Problem Relation Age of Onset   Kidney failure Father    Kidney failure Brother      SOCIAL HISTORY   Social History[1]   MEDICATIONS    Home Medication:    Current Medication: Current Medications[2]    ALLERGIES   Patient has no known allergies.     REVIEW OF SYSTEMS    Review of Systems:  Gen:  Denies  fever, sweats, chills weigh loss  HEENT: Denies blurred vision, double vision, ear pain, eye pain, hearing loss, nose bleeds, sore throat Cardiac:  No dizziness, chest pain or heaviness, chest tightness,edema Resp:   reports dyspnea chronically  Gi: Denies swallowing difficulty, stomach pain, nausea or vomiting, diarrhea, constipation, bowel incontinence Gu:  Denies bladder incontinence, burning urine Ext:   Denies Joint pain, stiffness or swelling Skin: Denies  skin rash, easy bruising or bleeding or hives Endoc:  Denies polyuria, polydipsia , polyphagia or weight change Psych:   Denies depression, insomnia or hallucinations   Other:  All other systems negative   VS: BP (!) 148/84 (BP Location: Left Arm)   Pulse 62   Temp (!) 97.5 F (36.4 C)   Resp 17   Wt 125.3 kg   SpO2 97%   BMI 39.64 kg/m      PHYSICAL EXAM    GENERAL:NAD, no fevers, chills, no weakness no fatigue HEAD: Normocephalic, atraumatic.  EYES: Pupils equal, round, reactive to light. Extraocular muscles intact. No scleral icterus.  MOUTH: Moist mucosal membrane. Dentition intact. No abscess  noted.  EAR, NOSE, THROAT: Clear without exudates. No external lesions.  NECK: Supple. No thyromegaly. No nodules. No JVD.  PULMONARY: decreased breath sounds with mild rhonchi worse at bases bilaterally.  CARDIOVASCULAR: S1 and S2. Regular rate and rhythm. No murmurs, rubs, or gallops. No edema. Pedal pulses 2+ bilaterally.  GASTROINTESTINAL: Soft, nontender, nondistended. No masses. Positive bowel sounds. No hepatosplenomegaly.  MUSCULOSKELETAL: No swelling, clubbing, or edema. Range of motion full in all extremities.  NEUROLOGIC: Cranial nerves II through XII are intact. No gross focal neurological deficits. Sensation intact. Reflexes intact.  SKIN: No ulceration, lesions, rashes, or cyanosis. Skin warm and dry. Turgor intact.  PSYCHIATRIC: Mood, affect within normal limits. The patient is awake, alert and oriented x 3. Insight, judgment intact.       IMAGING     ASSESSMENT/PLAN   Acute on chronic hypoxemic respiratory failure Patient with multiple etiologies of hypoxemia including  1. Pulmonary edema 2. Pleural effusions 3. Atelectasis 4. CHF 5. Restrictive ventilatory defect due to scoliosis 6. COPD 7. Morbid obesity  8. Physical deconditioning   Pulmonary edema - continue dialysis, net negative goal               - continue lasix  IV ESRD status  2. Pleural effusions - continue with HD, IR consult -  diagnostic thoracentesis 3. Atelectasis - ordering Chest PT with metaneb 4. Possible COPD exacerbation - noted bacterial and viral workup is in progress.  Continue doxycycline /cefepime  for now 4. CHF - acute on chronic - continue metoprolol  , lasix   5. Obesity - dietary changes, lifestyle modification 6. Physical deconditioning - contributing to dyspnea            Thank you for allowing me to participate in the care of this patient.   Patient/Family are satisfied with care plan and all questions have been answered.    Provider disclosure: Patient with at  least one acute or chronic illness or injury that poses a threat to life or bodily function and is being managed actively during this encounter.  All of the below services have been performed independently by signing provider:  review of prior documentation from internal and or external health records.  Review of previous and current lab results.  Interview and comprehensive assessment during patient visit today. Review of current and previous chest radiographs/CT scans. Discussion of management and test interpretation with health care team and patient/family.   This document was prepared using Dragon voice recognition software and may include unintentional dictation errors.     Rosalba Totty, M.D.  Division of Pulmonary & Critical Care Medicine                 [1]  Social History Tobacco Use   Smoking status: Former    Current packs/day: 0.25    Types: Cigarettes   Smokeless tobacco: Never  Vaping Use   Vaping status: Every Day   Substances: Nicotine   Substance Use Topics   Alcohol use: Not Currently   Drug use: Not Currently  [2]  Current Facility-Administered Medications:    acetaminophen  (TYLENOL ) tablet 650 mg, 650 mg, Oral, Q6H PRN, Laurita Manor T, MD   bisacodyl  (DULCOLAX) EC tablet 10 mg, 10 mg, Oral, QHS, Zhang, Ping T, MD, 10 mg at 10/26/24 2229   bisacodyl  (DULCOLAX) suppository 10 mg, 10 mg, Rectal, Daily PRN, Laurita Manor T, MD   calcitRIOL  (ROCALTROL ) capsule 0.5 mcg, 0.5 mcg, Oral, Daily, Laurita, Ping T, MD, 0.5 mcg at 10/27/24 9165   calcium  acetate (PHOSLO ) capsule 1,334 mg, 1,334 mg, Oral, TID WC, Breeze, Shantelle, NP, 1,334 mg at 10/27/24 1718   carvedilol  (COREG ) tablet 6.25 mg, 6.25 mg, Oral, BID WC, Von Bellis, MD, 6.25 mg at 10/27/24 1718   Chlorhexidine  Gluconate Cloth 2 % PADS 6 each, 6 each, Topical, Q0600, Lateef, Munsoor, MD, 6 each at 10/27/24 0521   diltiazem  (CARDIZEM  CD) 24 hr capsule 240 mg, 240 mg, Oral, QPM, Von Bellis, MD, 240 mg at  10/27/24 1718   doxepin  (SINEQUAN ) capsule 10 mg, 10 mg, Oral, QHS, Zhang, Ping T, MD, 10 mg at 10/27/24 2215   epoetin  alfa-epbx (RETACRIT ) injection 4,000 Units, 4,000 Units, Intravenous, Q M,W,F-1800, Breeze, Shantelle, NP, 4,000 Units at 10/26/24 1714   famotidine  (PEPCID ) tablet 10 mg, 10 mg, Oral, Daily, Laurita Manor T, MD, 10 mg at 10/27/24 9165   fluticasone  furoate-vilanterol (BREO ELLIPTA ) 200-25 MCG/ACT 1 puff, 1 puff, Inhalation, Daily, Lemario Chaikin, MD, 1 puff at 10/27/24 0835   furosemide  (LASIX ) injection 60 mg, 60 mg, Intravenous, BID, Von Bellis, MD,  60 mg at 10/27/24 1718   gabapentin  (NEURONTIN ) capsule 300 mg, 300 mg, Oral, QHS, Zhang, Ping T, MD, 300 mg at 10/27/24 2206   guaiFENesin  (MUCINEX ) 12 hr tablet 600 mg, 600 mg, Oral, BID, Von Bellis, MD, 600 mg at 10/27/24 2206   heparin  injection 5,000 Units, 5,000 Units, Subcutaneous, Q12H, Laurita Manor T, MD, 5,000 Units at 10/27/24 2205   insulin  aspart (novoLOG ) injection 0-9 Units, 0-9 Units, Subcutaneous, TID WC, Laurita Manor DASEN, MD, 9 Units at 10/27/24 1717   insulin  glargine-yfgn injection 30 Units, 30 Units, Subcutaneous, BID, Von Bellis, MD, 30 Units at 10/27/24 2216   ipratropium-albuterol  (DUONEB) 0.5-2.5 (3) MG/3ML nebulizer solution 3 mL, 3 mL, Nebulization, Q4H PRN, Von Bellis, MD, 3 mL at 10/27/24 1359   lactulose  (CHRONULAC ) 10 GM/15ML solution 20 g, 20 g, Oral, BID, Laurita Manor T, MD, 20 g at 10/27/24 2205   levothyroxine  (SYNTHROID ) tablet 175 mcg, 175 mcg, Oral, QHS, Laurita Manor T, MD, 175 mcg at 10/27/24 2206   linaclotide  (LINZESS ) capsule 145 mcg, 145 mcg, Oral, QAC breakfast, Laurita Manor T, MD, 145 mcg at 10/27/24 9165   loratadine  (CLARITIN ) tablet 10 mg, 10 mg, Oral, Daily, Laurita Manor T, MD, 10 mg at 10/27/24 9165   melatonin tablet 10 mg, 10 mg, Oral, QHS, Zhang, Ping T, MD, 10 mg at 10/27/24 2206   metoCLOPramide  (REGLAN ) injection 5 mg, 5 mg, Intravenous, Q6H PRN, Laurita Manor T, MD, 5 mg at  10/26/24 9158   midodrine  (PROAMATINE ) tablet 20 mg, 20 mg, Oral, Once per day on Monday Wednesday Friday, Laurita, Ping T, MD, 20 mg at 10/23/24 1018   naloxegol  oxalate (MOVANTIK ) tablet 12.5 mg, 12.5 mg, Oral, Daily, Von Bellis, MD, 12.5 mg at 10/27/24 2215   ondansetron  (ZOFRAN ) injection 4 mg, 4 mg, Intravenous, Q6H PRN, Laurita Manor T, MD, 4 mg at 10/27/24 9147   Oral care mouth rinse, 15 mL, Mouth Rinse, PRN, Foust, Katy L, NP   oxyCODONE  (Oxy IR/ROXICODONE ) immediate release tablet 5 mg, 5 mg, Oral, Q6H PRN, Von Bellis, MD, 5 mg at 10/27/24 2216   polyethylene glycol (MIRALAX  / GLYCOLAX ) packet 17 g, 17 g, Oral, BID, Laurita Manor T, MD, 17 g at 10/25/24 1023   predniSONE  (DELTASONE ) tablet 5 mg, 5 mg, Oral, Q breakfast, Von Bellis, MD   senna-docusate (Senokot-S) tablet 1 tablet, 1 tablet, Oral, BID, Laurita Manor T, MD, 1 tablet at 10/26/24 2229   simethicone  (MYLICON) chewable tablet 80 mg, 80 mg, Oral, QID, Zhang, Ping T, MD, 80 mg at 10/27/24 2207   sodium chloride  flush (NS) 0.9 % injection 3 mL, 3 mL, Intravenous, Q12H, Laurita Manor T, MD, 3 mL at 10/27/24 2208   sodium chloride  flush (NS) 0.9 % injection 3 mL, 3 mL, Intravenous, PRN, Zhang, Ping T, MD   sorbitol , magnesium  hydroxide, mineral oil, glycerin  (SMOG) enema, 960 mL, Rectal, Once, Von Bellis, MD   umeclidinium bromide  (INCRUSE ELLIPTA ) 62.5 MCG/ACT 1 puff, 1 puff, Inhalation, Daily, Savier Trickett, MD, 1 puff at 10/27/24 0835   vancomycin  (VANCOCIN ) IVPB 1000 mg/200 mL premix, 1,000 mg, Intravenous, Q M,W,F-HD, Tobie Cathaleen RAMAN, RPH, Last Rate: 200 mL/hr at 10/26/24 1722, 1,000 mg at 10/26/24 1722  "

## 2024-10-28 NOTE — Progress Notes (Signed)
 "    PULMONOLOGY         Date: 10/28/2024,   MRN# [id number] Eugene [name] October 08, 1960     AdmissionWeight: 124.5 kg                 CurrentWeight: 125.3 kg  Referring provider: Dr Von   CHIEF COMPLAINT:   Acute hypoxemic respiratory failure    HISTORY OF PRESENT ILLNESS   This is a 64 year old male DNR CODE STATUS history of osteomyelitis, spinal stenosis, IBS, dyslipidemia HCV, ESRD diabetes depression anxiety, recent diagnosis of MRSA bacteremia completed vancomycin  at baseline is hypoxemic with requirement of supplemental oxygen at 6 L/min, has bilateral BKA, chronic diastolic heart failure, recently completed negative endocarditis workup.  Came into the hospital due to worsening hypoxemia requiring 15 L of supplemental O2 on arrival in the ER.  He was found to be febrile with worsening hemoglobin and renal function.  Chest x-ray showed bilateral multifocal pneumonia.  Despite maximal medical management patient continues to have significant hypoxemia with difficulty weaning oxygen requirement.  PCCM consultation for further evaluation management.  10/22/24- patient s/p HD today with noted obstruction of both ports treated with cathflo. He did not tolerate metaneb therapy and asked to RT to discontinue the treatments.  He is negative 3L fluid balance.  He is on 10L/min HFNC. Moderate left effusion on CT chest is noted.   10/24/27- Patient with no overnight events.  His bloodwork shows improved hemoglobin.  He is still on elevated settings with HFNC.  I've ordered thoracentesis for left moderate pleural effusion.  Patient is on doxy/zosyn .  He's on low dose prednisone .   10/24/24- patient on HFNC 50%, he reports breathing is not better. He had diagnostic thoracentesis.  So far gram stain and culture is negative.  He is in no distress.  He was not able to have therpeutic removal of fluid with thoracentesis due to body habitus.  He's eating breakfast well and speaking in full sentences  without struggling.  He has ESRD with fluid/edema everywhere. Overall prognosis is poor. Im repeating CXR for interval changes on edema/effusion.  He remains on lasix  60 bid with HD and doxy bid  10/27/24- patient laying in bed in no acute distress.  He continues to have mild to moderate dyspnea. He had thoracentesis done to evaluate effusion. He is on lasix  60 bid and Vancomycin  with dialysis.  He is on 4L/min [place]   10/28/24- patient seen at bedside.  He continues to have atelectasis and episodic desaturations.  He refused to work with RT chestphysiotherapy metaneb for recruitment which I believe would greatly help his oxygenation.  He had left sided thoracentesis done 2 days ago with transudative monocyte predominant fluid with negative gram stain and culture consistent with non infected simple effusion likely due to ESRD.  Im repeating CXR today. Plan to continue PT/OT/CP/HD with diuresis  PAST MEDICAL HISTORY   Past Medical History:  Diagnosis Date   Anxiety    Depression    Diabetes mellitus without complication (HCC)    End stage renal disease (HCC)    Hepatitis C    Hyperlipidemia    IBS (irritable bowel syndrome)    Osteomyelitis (HCC)    Spinal stenosis      SURGICAL HISTORY   Past Surgical History:  Procedure Laterality Date   BACK SURGERY     bilateral amputation Bilateral    BKA Bilateral    CENTRAL LINE INSERTION-TUNNELED N/A 02/02/2019   Procedure: CENTRAL LINE INSERTION-TUNNELED;  Surgeon:  Marea Selinda RAMAN, MD;  Location: [facility];  Service: Cardiovascular;  Laterality: N/A;   COLONOSCOPY WITH PROPOFOL  N/A 01/12/2020   Procedure: COLONOSCOPY WITH PROPOFOL ;  Surgeon: Jinny Carmine, MD;  Location: [facility];  Service: Endoscopy;  Laterality: N/A;   DIALYSIS/PERMA CATHETER INSERTION N/A 05/22/2024   Procedure: DIALYSIS/PERMA CATHETER INSERTION;  Surgeon: Jama Cordella MATSU, MD;  Location: [facility];  Service: Cardiovascular;  Laterality: N/A;    DIALYSIS/PERMA CATHETER INSERTION N/A 07/21/2024   Procedure: DIALYSIS/PERMA CATHETER INSERTION;  Surgeon: Marea Selinda RAMAN, MD;  Location: [facility];  Service: Cardiovascular;  Laterality: N/A;   DIALYSIS/PERMA CATHETER INSERTION N/A 10/05/2024   Procedure: DIALYSIS/PERMA CATHETER INSERTION;  Surgeon: Marea Selinda RAMAN, MD;  Location: [facility];  Service: Cardiovascular;  Laterality: N/A;   DIALYSIS/PERMA CATHETER REMOVAL N/A 10/01/2024   Procedure: DIALYSIS/PERMA CATHETER REMOVAL;  Surgeon: Marea Selinda RAMAN, MD;  Location: [facility];  Service: Cardiovascular;  Laterality: N/A;   ESOPHAGOGASTRODUODENOSCOPY (EGD) WITH PROPOFOL  N/A 12/07/2019   Procedure: ESOPHAGOGASTRODUODENOSCOPY (EGD) WITH PROPOFOL ;  Surgeon: Unk Corinn Skiff, MD;  Location: [facility];  Service: Gastroenterology;  Laterality: N/A;   IR CATHETER TUBE CHANGE  12/04/2019   SPINAL FUSION     THORACIC SPINE SURGERY  01/2019   extensive washout     FAMILY HISTORY   Family History  Problem Relation Age of Onset   Kidney failure Father    Kidney failure Brother      SOCIAL HISTORY   Social History[1]   MEDICATIONS    Home Medication:    Current Medication: Current Medications[2]    ALLERGIES   Patient has no known allergies.     REVIEW OF SYSTEMS    Review of Systems:  Gen:  Denies  fever, sweats, chills weigh loss  HEENT: Denies blurred vision, double vision, ear pain, eye pain, hearing loss, nose bleeds, sore throat Cardiac:  No dizziness, chest pain or heaviness, chest tightness,edema Resp:   reports dyspnea chronically  Gi: Denies swallowing difficulty, stomach pain, nausea or vomiting, diarrhea, constipation, bowel incontinence Gu:  Denies bladder incontinence, burning urine Ext:   Denies Joint pain, stiffness or swelling Skin: Denies  skin rash, easy bruising or bleeding or hives Endoc:  Denies polyuria, polydipsia , polyphagia or weight change Psych:   Denies  depression, insomnia or hallucinations   Other:  All other systems negative   VS: BP (!) 148/84 (BP Location: Left Arm)   Pulse 62   Temp (!) 97.5 F (36.4 C)   Resp 17   Wt 125.3 kg   SpO2 97%   BMI 39.64 kg/m      PHYSICAL EXAM    GENERAL:NAD, no fevers, chills, no weakness no fatigue HEAD: Normocephalic, atraumatic.  EYES: Pupils equal, round, reactive to light. Extraocular muscles intact. No scleral icterus.  MOUTH: Moist mucosal membrane. Dentition intact. No abscess noted.  EAR, NOSE, THROAT: Clear without exudates. No external lesions.  NECK: Supple. No thyromegaly. No nodules. No JVD.  PULMONARY: decreased breath sounds with mild rhonchi worse at bases bilaterally.  CARDIOVASCULAR: S1 and S2. Regular rate and rhythm. No murmurs, rubs, or gallops. No edema. Pedal pulses 2+ bilaterally.  GASTROINTESTINAL: Soft, nontender, nondistended. No masses. Positive bowel sounds. No hepatosplenomegaly.  MUSCULOSKELETAL: No swelling, clubbing, or edema. Range of motion full in all extremities.  NEUROLOGIC: Cranial nerves II through XII are intact. No gross focal neurological deficits. Sensation intact. Reflexes intact.  SKIN: No ulceration, lesions, rashes, or  cyanosis. Skin warm and dry. Turgor intact.  PSYCHIATRIC: Mood, affect within normal limits. The patient is awake, alert and oriented x 3. Insight, judgment intact.       IMAGING     ASSESSMENT/PLAN   Acute on chronic hypoxemic respiratory failure Patient with multiple etiologies of hypoxemia including  1. Pulmonary edema 2. Pleural effusions 3. Atelectasis 4. CHF 5. Restrictive ventilatory defect due to scoliosis 6. COPD 7. Morbid obesity  8. Physical deconditioning   Pulmonary edema - continue dialysis, net negative goal               - continue lasix  IV ESRD status  2. Pleural effusions - continue with HD, IR consult -  diagnostic thoracentesis 3. Atelectasis - ordering Chest PT with metaneb 4. Possible  COPD exacerbation - noted bacterial and viral workup is in progress.  Continue doxycycline /cefepime  for now 4. CHF - acute on chronic - continue metoprolol  , lasix   5. Obesity - dietary changes, lifestyle modification 6. Physical deconditioning - contributing to dyspnea            Thank you for allowing me to participate in the care of this patient.   Patient/Family are satisfied with care plan and all questions have been answered.    Provider disclosure: Patient with at least one acute or chronic illness or injury that poses a threat to life or bodily function and is being managed actively during this encounter.  All of the below services have been performed independently by signing provider:  review of prior documentation from internal and or external health records.  Review of previous and current lab results.  Interview and comprehensive assessment during patient visit today. Review of current and previous chest radiographs/CT scans. Discussion of management and test interpretation with health care team and patient/family.   This document was prepared using Dragon voice recognition software and may include unintentional dictation errors.     Neyah Ellerman, M.D.  Division of Pulmonary & Critical Care Medicine                  [1]  Social History Tobacco Use   Smoking status: Former    Current packs/day: 0.25    Types: Cigarettes   Smokeless tobacco: Never  Vaping Use   Vaping status: Every Day   Substances: Nicotine   Substance Use Topics   Alcohol use: Not Currently   Drug use: Not Currently  [2]  Current Facility-Administered Medications:    acetaminophen  (TYLENOL ) tablet 650 mg, 650 mg, Oral, Q6H PRN, Laurita Manor T, MD   bisacodyl  (DULCOLAX) EC tablet 10 mg, 10 mg, Oral, QHS, Zhang, Ping T, MD, 10 mg at 10/26/24 2229   bisacodyl  (DULCOLAX) suppository 10 mg, 10 mg, Rectal, Daily PRN, Laurita Manor T, MD   calcitRIOL  (ROCALTROL ) capsule 0.5 mcg, 0.5  mcg, Oral, Daily, Laurita, Ping T, MD, 0.5 mcg at 10/27/24 9165   calcium  acetate (PHOSLO ) capsule 1,334 mg, 1,334 mg, Oral, TID WC, Breeze, Shantelle, NP, 1,334 mg at 10/27/24 1718   carvedilol  (COREG ) tablet 6.25 mg, 6.25 mg, Oral, BID WC, Von Bellis, MD, 6.25 mg at 10/27/24 1718   Chlorhexidine  Gluconate Cloth 2 % PADS 6 each, 6 each, Topical, Q0600, Lateef, Munsoor, MD, 6 each at 10/27/24 0521   diltiazem  (CARDIZEM  CD) 24 hr capsule 240 mg, 240 mg, Oral, QPM, Von Bellis, MD, 240 mg at 10/27/24 1718   doxepin  (SINEQUAN ) capsule 10 mg, 10 mg, Oral, QHS, Zhang, Ping T, MD, 10 mg at 10/27/24  2215   epoetin  alfa-epbx (RETACRIT ) injection 4,000 Units, 4,000 Units, Intravenous, Q M,W,F-1800, Breeze, Shantelle, NP, 4,000 Units at 10/26/24 1714   famotidine  (PEPCID ) tablet 10 mg, 10 mg, Oral, Daily, Laurita Manor T, MD, 10 mg at 10/27/24 9165   fluticasone  furoate-vilanterol (BREO ELLIPTA ) 200-25 MCG/ACT 1 puff, 1 puff, Inhalation, Daily, Melis Trochez, MD, 1 puff at 10/27/24 0835   furosemide  (LASIX ) injection 60 mg, 60 mg, Intravenous, BID, Von Bellis, MD, 60 mg at 10/27/24 1718   gabapentin  (NEURONTIN ) capsule 300 mg, 300 mg, Oral, QHS, Zhang, Ping T, MD, 300 mg at 10/27/24 2206   guaiFENesin  (MUCINEX ) 12 hr tablet 600 mg, 600 mg, Oral, BID, Von Bellis, MD, 600 mg at 10/27/24 2206   heparin  injection 5,000 Units, 5,000 Units, Subcutaneous, Q12H, Laurita Manor T, MD, 5,000 Units at 10/27/24 2205   insulin  aspart (novoLOG ) injection 0-9 Units, 0-9 Units, Subcutaneous, TID WC, Laurita Manor T, MD, 9 Units at 10/27/24 1717   insulin  glargine-yfgn injection 30 Units, 30 Units, Subcutaneous, BID, Von Bellis, MD, 30 Units at 10/27/24 2216   ipratropium-albuterol  (DUONEB) 0.5-2.5 (3) MG/3ML nebulizer solution 3 mL, 3 mL, Nebulization, Q4H PRN, Von Bellis, MD, 3 mL at 10/27/24 1359   lactulose  (CHRONULAC ) 10 GM/15ML solution 20 g, 20 g, Oral, BID, Laurita Manor T, MD, 20 g at 10/27/24 2205    levothyroxine  (SYNTHROID ) tablet 175 mcg, 175 mcg, Oral, QHS, Laurita Manor T, MD, 175 mcg at 10/27/24 2206   linaclotide  (LINZESS ) capsule 145 mcg, 145 mcg, Oral, QAC breakfast, Laurita Manor T, MD, 145 mcg at 10/27/24 9165   loratadine  (CLARITIN ) tablet 10 mg, 10 mg, Oral, Daily, Laurita Manor T, MD, 10 mg at 10/27/24 9165   melatonin tablet 10 mg, 10 mg, Oral, QHS, Zhang, Ping T, MD, 10 mg at 10/27/24 2206   metoCLOPramide  (REGLAN ) injection 5 mg, 5 mg, Intravenous, Q6H PRN, Laurita Manor T, MD, 5 mg at 10/26/24 9158   midodrine  (PROAMATINE ) tablet 20 mg, 20 mg, Oral, Once per day on Monday Wednesday Friday, Laurita, Ping T, MD, 20 mg at 10/23/24 1018   naloxegol  oxalate (MOVANTIK ) tablet 12.5 mg, 12.5 mg, Oral, Daily, Von Bellis, MD, 12.5 mg at 10/27/24 2215   ondansetron  (ZOFRAN ) injection 4 mg, 4 mg, Intravenous, Q6H PRN, Laurita Manor T, MD, 4 mg at 10/27/24 9147   Oral care mouth rinse, 15 mL, Mouth Rinse, PRN, Foust, Katy L, NP   oxyCODONE  (Oxy IR/ROXICODONE ) immediate release tablet 5 mg, 5 mg, Oral, Q6H PRN, Von Bellis, MD, 5 mg at 10/27/24 2216   polyethylene glycol (MIRALAX  / GLYCOLAX ) packet 17 g, 17 g, Oral, BID, Laurita Manor T, MD, 17 g at 10/25/24 1023   predniSONE  (DELTASONE ) tablet 5 mg, 5 mg, Oral, Q breakfast, Von Bellis, MD   senna-docusate (Senokot-S) tablet 1 tablet, 1 tablet, Oral, BID, Laurita Manor T, MD, 1 tablet at 10/26/24 2229   simethicone  (MYLICON) chewable tablet 80 mg, 80 mg, Oral, QID, Zhang, Ping T, MD, 80 mg at 10/27/24 2207   sodium chloride  flush (NS) 0.9 % injection 3 mL, 3 mL, Intravenous, Q12H, Laurita Manor T, MD, 3 mL at 10/27/24 2208   sodium chloride  flush (NS) 0.9 % injection 3 mL, 3 mL, Intravenous, PRN, Zhang, Ping T, MD   sorbitol , magnesium  hydroxide, mineral oil, glycerin  (SMOG) enema, 960 mL, Rectal, Once, Von Bellis, MD   umeclidinium bromide  (INCRUSE ELLIPTA ) 62.5 MCG/ACT 1 puff, 1 puff, Inhalation, Daily, Jaquavion Mccannon, MD, 1 puff at 10/27/24  [phone]  vancomycin  (VANCOCIN ) IVPB 1000 mg/200 mL premix, 1,000 mg, Intravenous, Q M,W,F-HD, Tobie Cathaleen RAMAN, RPH, Last Rate: 200 mL/hr at 10/26/24 1722, 1,000 mg at 10/26/24 1722  "

## 2024-10-29 DIAGNOSIS — J9601 Acute respiratory failure with hypoxia: Secondary | ICD-10-CM | POA: Diagnosis not present

## 2024-10-29 LAB — GLUCOSE, CAPILLARY
Glucose-Capillary: 345 mg/dL — ABNORMAL HIGH (ref 70–99)
Glucose-Capillary: 372 mg/dL — ABNORMAL HIGH (ref 70–99)
Glucose-Capillary: 365 mg/dL — ABNORMAL HIGH (ref 70–99)

## 2024-10-29 LAB — COMPREHENSIVE METABOLIC PANEL WITH GFR
ALT: 22 U/L (ref 0–44)
AST: 15 U/L (ref 15–41)
Albumin: 2.8 g/dL — ABNORMAL LOW (ref 3.5–5.0)
Alkaline Phosphatase: 61 U/L (ref 38–126)
Anion gap: 13 (ref 5–15)
BUN: 54 mg/dL — ABNORMAL HIGH (ref 8–23)
CO2: 22 mmol/L (ref 22–32)
Calcium: 8 mg/dL — ABNORMAL LOW (ref 8.9–10.3)
Chloride: 95 mmol/L — ABNORMAL LOW (ref 98–111)
Creatinine, Ser: 4.15 mg/dL — ABNORMAL HIGH (ref 0.61–1.24)
GFR, Estimated: 16 mL/min — ABNORMAL LOW
Glucose, Bld: 386 mg/dL — ABNORMAL HIGH (ref 70–99)
Potassium: 5.7 mmol/L — ABNORMAL HIGH (ref 3.5–5.1)
Sodium: 130 mmol/L — ABNORMAL LOW (ref 135–145)
Total Bilirubin: <0.2 mg/dL (ref 0.0–1.2)
Total Protein: 6.1 g/dL — ABNORMAL LOW (ref 6.5–8.1)

## 2024-10-29 LAB — HEPATITIS B SURFACE ANTIBODY, QUANTITATIVE: Hep B S AB Quant (Post): <3.5 m[IU]/mL — ABNORMAL LOW

## 2024-10-29 MED ORDER — INSULIN GLARGINE-YFGN 100 UNIT/ML ~~LOC~~ SOLN: 35.0000 [IU] | Freq: Two times a day (BID) | SUBCUTANEOUS | Status: AC

## 2024-10-29 MED ORDER — FLUTICASONE FUROATE-VILANTEROL 200-25 MCG/ACT IN AEPB: 1.0000 | INHALATION_SPRAY | Freq: Every day | RESPIRATORY_TRACT | Status: AC

## 2024-10-29 MED ORDER — UMECLIDINIUM BROMIDE 62.5 MCG/ACT IN AEPB: 1.0000 | INHALATION_SPRAY | Freq: Every day | RESPIRATORY_TRACT | Status: AC

## 2024-10-29 MED ORDER — NALOXEGOL OXALATE 12.5 MG PO TABS: 12.5000 mg | ORAL_TABLET | Freq: Every day | ORAL | 2 refills | Status: AC

## 2024-10-29 MED ORDER — FUROSEMIDE 80 MG PO TABS: 80.0000 mg | ORAL_TABLET | Freq: Two times a day (BID) | ORAL | Status: AC

## 2024-10-29 NOTE — TOC Transition Note (Signed)
 Transition of Care [facility]) - Discharge Note   Patient Details  Name: Eugene [name] MRN: [id number] Date of Birth: 24-Aug-1961  [facility] CM/SW Contact:  Shasta DELENA Daring, RN Phone Number: 10/29/2024, 3:04 PM   Clinical Narrative:    Patient has discharge order.   Left VM for patient brother notifying of discharge  Discharge summary sent to [facility].  Transport arranged via [facility].  No additional TOC needs.  RNCM signing off     Final next level of care: Skilled Nursing Facility Barriers to Discharge: Barriers Resolved   Patient Goals and CMS Choice            Discharge Placement              Patient chooses bed at: [facility] Patient to be transferred to facility by: [facility] Name of family member notified: Gustavo Bunnell Patient and family notified of of transfer: 10/29/24  Discharge Plan and Services Additional resources added to the After Visit Summary for                                       Social Drivers of Health (SDOH) Interventions SDOH Screenings   Food Insecurity: No Food Insecurity (10/20/2024)  Housing: Low Risk (10/20/2024)  Transportation Needs: No Transportation Needs (10/20/2024)  Utilities: Not At Risk (10/20/2024)  Social Connections: Socially Isolated (10/20/2024)  Tobacco Use: Medium Risk (10/20/2024)     Readmission Risk Interventions    10/21/2024    3:16 PM 10/20/2024    1:24 PM 05/05/2024    3:05 PM  Readmission Risk Prevention Plan  Transportation Screening Complete Complete Complete  Medication Review [name]) Complete Complete Complete  PCP or Specialist appointment within 3-5 days of discharge -- Complete Complete  HRI or Home Care Consult Complete    SW Recovery Care/Counseling Consult  Complete Complete  Palliative Care Screening Not Applicable Not Applicable Not Applicable  Skilled Nursing Facility Complete Complete Complete

## 2024-10-29 NOTE — Progress Notes (Signed)
 Patient is prepared for discharge per provider orders. Discharge instructions reviewed with nurse at [facility], including medicaitons, follow-up appointments, diet and activity. IV access removed with tip intact. Vital signs measured and documented. No acute signs of distress noted. Personal belongings gathered and accounted for. Patient left unit via wheelchair accompanied by family.

## 2024-10-29 NOTE — Inpatient Diabetes Management (Signed)
 Inpatient Diabetes Program Recommendations  AACE/ADA: New Consensus Statement on Inpatient Glycemic Control (2015)  Target Ranges:  Prepandial:   less than 140 mg/dL      Peak postprandial:   less than 180 mg/dL (1-2 hours)      Critically ill patients:  140 - 180 mg/dL    Latest Reference Range & Units 10/29/24 08:32 10/29/24 11:51  Glucose-Capillary 70 - 99 mg/dL 654 (H) 627 (H)  (H): Data is abnormally high    SNF Meds: Semglee  20 units BID         Humalog  0-15 QID per SSI     Current Orders: Semglee  35 units BID                             Novolog  0-9 units TID       Novolog  6 units TID with meals                            Prednisone  5 mg daily     MD- If pt not discharged to SNF later today, Please consider:   1. Increase Semglee  38 units BID   2. Increase Novolog  Meal Coverage to 8 units TID with meals       --Will follow patient during hospitalization--   Adina Rudolpho Arrow RN, MSN, CDCES Diabetes Coordinator Inpatient Glycemic Control Team Team Pager: [phone] (8a-5p)

## 2024-10-29 NOTE — Progress Notes (Signed)
 " [facility]  ROUNDING NOTE   Subjective:   Eugene [name] is a 64 year old male with past medical conditions including diabetes, hepatitis C, bilateral BKA, anxiety and depression, and end-stage renal disease on hemodialysis.  Patient presents to the emergency department from his facility complaining of shortness of breath.  Patient is currently admitted for CHF (congestive heart failure) (HCC) [I50.9] Acute respiratory failure with hypoxia (HCC) [J96.01] MRSA bacteremia [R78.81, B95.62] Hypervolemia, unspecified hypervolemia type [E87.70] Sepsis, due to unspecified organism, unspecified whether acute organ dysfunction present [facility]) [A41.9]  Patient is known to our practice and receives outpatient dialysis treatments at [facility] on a MWF schedule, supervised by Dr. Dennise.    Update:  Patient sitting up in bed Partially completed breakfast tray at bedside Remains on 8L [place]   Objective:  Vital signs in last 24 hours:  Temp:  [97.5 F (36.4 C)-99 F (37.2 C)] 98 F (36.7 C) (02/05 1130) Pulse Rate:  [63-74] 68 (02/05 1130) Resp:  [16-20] 20 (02/05 1130) BP: (120-154)/(58-88) 135/68 (02/05 1130) SpO2:  [97 %-100 %] 100 % (02/05 1130) Weight:  [120.1 kg-122.7 kg] 122.7 kg (02/05 0500)  Weight change: -1.9 kg Filed Weights   10/28/24 1258 10/28/24 1656 10/29/24 0500  Weight: 123.4 kg 120.1 kg 122.7 kg     Intake/Output: I/O last 3 completed shifts: In: 990 [P.O.:990] Out: 5350 [Urine:2350; Other:3000]   Intake/Output this shift:  Total I/O In: 340 [P.O.:240; Other:100] Out: -   Physical Exam: General: NAD  Head: Normocephalic  Eyes: Anicteric  Lungs:  Coarse, HFNC  Heart: Regular rate and rhythm  Abdomen:  Soft, nontender  Extremities: ABD and BUE edema.  Bilateral BKA  Neurologic: Awake, alert, conversant  Skin: Warm,dry, no rash  Access: Lt internal jugular permcath    Basic Metabolic Panel: Recent Labs  Lab 10/23/24 0302 10/25/24 0458  10/25/24 0829 10/26/24 0742 10/26/24 1050 10/28/24 0420 10/29/24 0829  NA 128* 130*  --  131*  --  131* 130*  K 5.4* 5.3*  --  5.8*  --  6.0* 5.7*  CL 94* 93*  --  94*  --  94* 95*  CO2 26 25  --  26  --  26 22  GLUCOSE 282* 466* 424* 478* 461* 436* 386*  BUN 48* 47*  --  61*  --  63* 54*  CREATININE 4.51* 4.11*  --  4.73*  --  4.41* 4.15*  CALCIUM  7.8* 8.5*  --  8.6*  --  8.1* 8.0*  MG  --  2.0  --   --   --   --   --   PHOS  --   --   --  5.7*  --   --   --     Liver Function Tests: Recent Labs  Lab 10/26/24 0742 10/29/24 0829  AST  --  15  ALT  --  22  ALKPHOS  --  61  BILITOT  --  <0.2  PROT  --  6.1*  ALBUMIN  3.0* 2.8*   No results for input(s): LIPASE, AMYLASE in the last 168 hours.  No results for input(s): AMMONIA in the last 168 hours.  CBC: Recent Labs  Lab 10/23/24 0302 10/25/24 0458 10/26/24 0742 10/28/24 0420  WBC 7.5 5.4 10.5 9.2  NEUTROABS  --   --  8.3*  --   HGB 8.0* 8.5* 8.6* 8.8*  HCT 26.1* 26.7* 26.8* 28.0*  MCV 96.3 92.1 94.0 95.9  PLT 203 195 201 175  Cardiac Enzymes: No results for input(s): CKTOTAL, CKMB, CKMBINDEX, TROPONINI in the last 168 hours.  BNP: Invalid input(s): POCBNP  CBG: Recent Labs  Lab 10/28/24 1056 10/28/24 1740 10/28/24 2030 10/29/24 0832 10/29/24 1151  GLUCAP 328* 254* 315* 345* 372*    Microbiology: Results for orders placed or performed during the hospital encounter of 10/19/24  Blood Culture (routine x 2)     Status: None   Collection Time: 10/19/24 11:53 PM   Specimen: BLOOD  Result Value Ref Range Status   Specimen Description BLOOD RIGHT UPPER ARM  Final   Special Requests   Final    BOTTLES DRAWN AEROBIC AND ANAEROBIC Blood Culture results may not be optimal due to an inadequate volume of blood received in culture bottles   Culture   Final    NO GROWTH 5 DAYS Performed at [facility], [address] [place], [place] [address]    Report Status 10/25/2024 FINAL   Final  Resp panel by RT-PCR (RSV, Flu A&B, Covid) Anterior Nasal Swab     Status: None   Collection Time: 10/19/24 11:53 PM   Specimen: Anterior Nasal Swab  Result Value Ref Range Status   SARS Coronavirus 2 by RT PCR NEGATIVE NEGATIVE Final    Comment: (NOTE) SARS-CoV-2 target nucleic acids are NOT DETECTED.  The SARS-CoV-2 RNA is generally detectable in upper respiratory specimens during the acute phase of infection. The lowest concentration of SARS-CoV-2 viral copies this assay can detect is 138 copies/mL. A negative result does not preclude SARS-Cov-2 infection and should not be used as the sole basis for treatment or other patient management decisions. A negative result may occur with  improper specimen collection/handling, submission of specimen other than nasopharyngeal swab, presence of viral mutation(s) within the areas targeted by this assay, and inadequate number of viral copies(<138 copies/mL). A negative result must be combined with clinical observations, patient history, and epidemiological information. The expected result is Negative.  Fact Sheet for Patients:  bloggercourse.com  Fact Sheet for Healthcare Providers:  seriousbroker.it  This test is no t yet approved or cleared by the [place]  FDA and  has been authorized for detection and/or diagnosis of SARS-CoV-2 by FDA under an Emergency Use Authorization (EUA). This EUA will remain  in effect (meaning this test can be used) for the duration of the COVID-19 declaration under Section 564(b)(1) of the Act, 21 U.S.C.section 360bbb-3(b)(1), unless the authorization is terminated  or revoked sooner.       Influenza A by PCR NEGATIVE NEGATIVE Final   Influenza B by PCR NEGATIVE NEGATIVE Final    Comment: (NOTE) The Xpert Xpress SARS-CoV-2/FLU/RSV plus assay is intended as an aid in the diagnosis of influenza from Nasopharyngeal swab specimens and should not be  used as a sole basis for treatment. Nasal washings and aspirates are unacceptable for Xpert Xpress SARS-CoV-2/FLU/RSV testing.  Fact Sheet for Patients: bloggercourse.com  Fact Sheet for Healthcare Providers: seriousbroker.it  This test is not yet approved or cleared by the [place]  FDA and has been authorized for detection and/or diagnosis of SARS-CoV-2 by FDA under an Emergency Use Authorization (EUA). This EUA will remain in effect (meaning this test can be used) for the duration of the COVID-19 declaration under Section 564(b)(1) of the Act, 21 U.S.C. section 360bbb-3(b)(1), unless the authorization is terminated or revoked.     Resp Syncytial Virus by PCR NEGATIVE NEGATIVE Final    Comment: (NOTE) Fact Sheet for Patients: bloggercourse.com  Fact Sheet for Healthcare  Providers: seriousbroker.it  This test is not yet approved or cleared by the [place]  FDA and has been authorized for detection and/or diagnosis of SARS-CoV-2 by FDA under an Emergency Use Authorization (EUA). This EUA will remain in effect (meaning this test can be used) for the duration of the COVID-19 declaration under Section 564(b)(1) of the Act, 21 U.S.C. section 360bbb-3(b)(1), unless the authorization is terminated or revoked.  Performed at [facility], [address] [place], [place] [address]   Blood Culture (routine x 2)     Status: None   Collection Time: 10/20/24 12:26 AM   Specimen: BLOOD  Result Value Ref Range Status   Specimen Description BLOOD LEFT UPPER ARM  Final   Special Requests   Final    BOTTLES DRAWN AEROBIC AND ANAEROBIC Blood Culture results may not be optimal due to an inadequate volume of blood received in culture bottles   Culture   Final    NO GROWTH 5 DAYS Performed at [facility], [address] [place], [place] [address]    Report Status  10/25/2024 FINAL  Final  Urine Culture     Status: Abnormal   Collection Time: 10/20/24 12:58 AM   Specimen: Urine, Random  Result Value Ref Range Status   Specimen Description   Final    URINE, RANDOM Performed at [facility], [address] [place], [place] [address]    Special Requests   Final    NONE Reflexed from [id number] Performed at [facility], [address] [place], [place] [address]    Culture (A)  Final    >=100,000 COLONIES/mL PROTEUS MIRABILIS >=100,000 COLONIES/mL ENTEROCOCCUS FAECALIS VANCOMYCIN  RESISTANT ENTEROCOCCUS ISOLATED    Report Status 10/22/2024 FINAL  Final   Organism ID, Bacteria PROTEUS MIRABILIS (A)  Final   Organism ID, Bacteria ENTEROCOCCUS FAECALIS (A)  Final      Susceptibility   Enterococcus faecalis - MIC*    AMPICILLIN <=2 SENSITIVE Sensitive     NITROFURANTOIN <=16 SENSITIVE Sensitive     VANCOMYCIN  >=32 RESISTANT Resistant     LINEZOLID  2 SENSITIVE Sensitive     * >=100,000 COLONIES/mL ENTEROCOCCUS FAECALIS   Proteus mirabilis - MIC*    AMPICILLIN <=2 SENSITIVE Sensitive     CEFAZOLIN  (URINE) Value in next row Sensitive      4 SENSITIVEThis is a modified FDA-approved test that has been validated and its performance characteristics determined by the reporting laboratory.  This laboratory is certified under the Clinical Laboratory Improvement Amendments CLIA as qualified to perform high complexity clinical laboratory testing.    CEFEPIME  Value in next row Sensitive      4 SENSITIVEThis is a modified FDA-approved test that has been validated and its performance characteristics determined by the reporting laboratory.  This laboratory is certified under the Clinical Laboratory Improvement Amendments CLIA as qualified to perform high complexity clinical laboratory testing.    ERTAPENEM Value in next row Sensitive      4 SENSITIVEThis is a modified FDA-approved test that has been validated and its performance characteristics  determined by the reporting laboratory.  This laboratory is certified under the Clinical Laboratory Improvement Amendments CLIA as qualified to perform high complexity clinical laboratory testing.    CEFTRIAXONE  Value in next row Sensitive      4 SENSITIVEThis is a modified FDA-approved test that has been validated and its performance characteristics determined by the reporting laboratory.  This laboratory is certified under the Clinical Laboratory Improvement Amendments CLIA as  qualified to perform high complexity clinical laboratory testing.    CIPROFLOXACIN  Value in next row Resistant      4 SENSITIVEThis is a modified FDA-approved test that has been validated and its performance characteristics determined by the reporting laboratory.  This laboratory is certified under the Clinical Laboratory Improvement Amendments CLIA as qualified to perform high complexity clinical laboratory testing.    GENTAMICIN Value in next row Sensitive      4 SENSITIVEThis is a modified FDA-approved test that has been validated and its performance characteristics determined by the reporting laboratory.  This laboratory is certified under the Clinical Laboratory Improvement Amendments CLIA as qualified to perform high complexity clinical laboratory testing.    NITROFURANTOIN Value in next row Resistant      4 SENSITIVEThis is a modified FDA-approved test that has been validated and its performance characteristics determined by the reporting laboratory.  This laboratory is certified under the Clinical Laboratory Improvement Amendments CLIA as qualified to perform high complexity clinical laboratory testing.    TRIMETH /SULFA  Value in next row Sensitive      4 SENSITIVEThis is a modified FDA-approved test that has been validated and its performance characteristics determined by the reporting laboratory.  This laboratory is certified under the Clinical Laboratory Improvement Amendments CLIA as qualified to perform high complexity  clinical laboratory testing.    AMPICILLIN/SULBACTAM Value in next row Sensitive      4 SENSITIVEThis is a modified FDA-approved test that has been validated and its performance characteristics determined by the reporting laboratory.  This laboratory is certified under the Clinical Laboratory Improvement Amendments CLIA as qualified to perform high complexity clinical laboratory testing.    PIP/TAZO Value in next row Sensitive      <=4 SENSITIVEThis is a modified FDA-approved test that has been validated and its performance characteristics determined by the reporting laboratory.  This laboratory is certified under the Clinical Laboratory Improvement Amendments CLIA as qualified to perform high complexity clinical laboratory testing.    MEROPENEM Value in next row Sensitive      <=4 SENSITIVEThis is a modified FDA-approved test that has been validated and its performance characteristics determined by the reporting laboratory.  This laboratory is certified under the Clinical Laboratory Improvement Amendments CLIA as qualified to perform high complexity clinical laboratory testing.    * >=100,000 COLONIES/mL PROTEUS MIRABILIS  Respiratory (~20 pathogens) panel by PCR     Status: None   Collection Time: 10/20/24  4:09 PM   Specimen: Nasopharyngeal Swab; Respiratory  Result Value Ref Range Status   Adenovirus NOT DETECTED NOT DETECTED Final   Coronavirus 229E NOT DETECTED NOT DETECTED Final    Comment: (NOTE) The Coronavirus on the Respiratory Panel, DOES NOT test for the novel  Coronavirus (2019 nCoV)    Coronavirus HKU1 NOT DETECTED NOT DETECTED Final   Coronavirus NL63 NOT DETECTED NOT DETECTED Final   Coronavirus OC43 NOT DETECTED NOT DETECTED Final   Metapneumovirus NOT DETECTED NOT DETECTED Final   Rhinovirus / Enterovirus NOT DETECTED NOT DETECTED Final   Influenza A NOT DETECTED NOT DETECTED Final   Influenza B NOT DETECTED NOT DETECTED Final   Parainfluenza Virus 1 NOT DETECTED NOT  DETECTED Final   Parainfluenza Virus 2 NOT DETECTED NOT DETECTED Final   Parainfluenza Virus 3 NOT DETECTED NOT DETECTED Final   Parainfluenza Virus 4 NOT DETECTED NOT DETECTED Final   Respiratory Syncytial Virus NOT DETECTED NOT DETECTED Final   Bordetella pertussis NOT DETECTED NOT DETECTED Final   Bordetella  Parapertussis NOT DETECTED NOT DETECTED Final   Chlamydophila pneumoniae NOT DETECTED NOT DETECTED Final   Mycoplasma pneumoniae NOT DETECTED NOT DETECTED Final    Comment: Performed at [facility], [address], [place], [place] [address]  Body fluid culture w Gram Stain     Status: None   Collection Time: 10/23/24  4:37 PM   Specimen: Lung, Left; Pleural Fluid  Result Value Ref Range Status   Specimen Description   Final    LUNG Performed at [facility], [address] [place], [place] [address]    Special Requests   Final    NONE Performed at [facility], [address] [place], [place] [address]    Gram Stain   Final    FEW WBC PRESENT, PREDOMINANTLY MONONUCLEAR NO ORGANISMS SEEN    Culture   Final    NO GROWTH 3 DAYS Performed at [facility], [address], [place], [place] [address]    Report Status 10/26/2024 FINAL  Final    Coagulation Studies: No results for input(s): LABPROT, INR in the last 72 hours.    Urinalysis: No results for input(s): COLORURINE, LABSPEC, PHURINE, GLUCOSEU, HGBUR, BILIRUBINUR, KETONESUR, PROTEINUR, UROBILINOGEN, NITRITE, LEUKOCYTESUR in the last 72 hours.  Invalid input(s): APPERANCEUR      Imaging: PERIPHERAL VASCULAR CATHETERIZATION Result Date: 10/28/2024 See surgical note for result.    Medications:    vancomycin  Stopped (10/28/24 1724)    bisacodyl   10 mg Oral QHS   calcitRIOL   0.5 mcg Oral Daily   calcium  acetate  1,334 mg Oral TID WC   carvedilol   6.25 mg Oral BID WC   Chlorhexidine  Gluconate Cloth  6 each Topical Q0600   diltiazem   240 mg  Oral QPM   doxepin   10 mg Oral QHS   epoetin  alfa-epbx (RETACRIT ) injection  4,000 Units Intravenous Q M,W,F-1800   famotidine   10 mg Oral Daily   fluticasone  furoate-vilanterol  1 puff Inhalation Daily   furosemide   60 mg Intravenous BID   gabapentin   300 mg Oral QHS   guaiFENesin   600 mg Oral BID   heparin   5,000 Units Subcutaneous Q12H   insulin  aspart  0-9 Units Subcutaneous TID WC   insulin  aspart  6 Units Subcutaneous TID WC   insulin  glargine-yfgn  35 Units Subcutaneous BID   lactulose   20 g Oral BID   levothyroxine   175 mcg Oral QHS   linaclotide   145 mcg Oral QAC breakfast   loratadine   10 mg Oral Daily   melatonin  10 mg Oral QHS   midodrine   20 mg Oral Once per day on Monday Wednesday Friday   naloxegol  oxalate  12.5 mg Oral Daily   polyethylene glycol  17 g Oral BID   predniSONE   5 mg Oral Q breakfast   senna-docusate  1 tablet Oral BID   simethicone   80 mg Oral QID   sodium chloride  flush  3 mL Intravenous Q12H   SMOG  960 mL Rectal Once   umeclidinium bromide   1 puff Inhalation Daily   acetaminophen , bisacodyl , ipratropium-albuterol , metoCLOPramide  (REGLAN ) injection, ondansetron  (ZOFRAN ) IV, mouth rinse, oxyCODONE , sodium chloride  flush  Assessment/ Plan:  [name] is a 64 y.o.  male with past medical conditions including diabetes, hepatitis C, bilateral BKA, anxiety and depression, and end-stage renal disease on hemodialysis.  Patient presents to the emergency department from his facility complaining of shortness of breath.  Patient is currently admitted for CHF (congestive heart  failure) (HCC) [I50.9] Acute respiratory failure with hypoxia (HCC) [J96.01] MRSA bacteremia [R78.81, B95.62] Hypervolemia, unspecified hypervolemia type [E87.70] Sepsis, due to unspecified organism, unspecified whether acute organ dysfunction present (HCC) [A41.9]  [facility]/MWF/120.5 kg  End-stage renal disease with hyperkalemia on hemodialysis.   Received  dialysis yesterday after permcath exchange.  PermCath functioned really well during dialysis yesterday.  Next treatment scheduled for Friday.  Will defer discharge plan to primary team.  Anemia of chronic kidney disease Lab Results  Component Value Date   HGB 8.8 (L) 10/28/2024   Hemoglobin 8.8, continue Retacrit  4000 units with dialysis.  3. Secondary Hyperparathyroidism: with outpatient labs: None available  Lab Results  Component Value Date   PTH 177 (H) 04/23/2024   CALCIUM  8.0 (L) 10/29/2024   PHOS 5.7 (H) 10/26/2024   Hyperphosphatemia has improved during this admission. Continue calcium  acetate 1334 mg with meals.      LOS: 9 Eugene [name] 2/5/20261:54 PM   "

## 2024-10-29 NOTE — Progress Notes (Signed)
 Report called to [employer] admission's nurse Marcell.

## 2024-10-29 NOTE — Progress Notes (Signed)
 Patient refuses to allow nursing staff to wean O2. MD aware.

## 2024-10-29 NOTE — Progress Notes (Signed)
 "    PULMONOLOGY         Date: 10/29/2024,   MRN# [id number] Rankin Coolman October 27, 1960     AdmissionWeight: 124.5 kg                 CurrentWeight: 122.7 kg  Referring provider: Dr Von   CHIEF COMPLAINT:   Acute hypoxemic respiratory failure    HISTORY OF PRESENT ILLNESS   This is a 64 year old male DNR CODE STATUS history of osteomyelitis, spinal stenosis, IBS, dyslipidemia HCV, ESRD diabetes depression anxiety, recent diagnosis of MRSA bacteremia completed vancomycin  at baseline is hypoxemic with requirement of supplemental oxygen at 6 L/min, has bilateral BKA, chronic diastolic heart failure, recently completed negative endocarditis workup.  Came into the hospital due to worsening hypoxemia requiring 15 L of supplemental O2 on arrival in the ER.  He was found to be febrile with worsening hemoglobin and renal function.  Chest x-ray showed bilateral multifocal pneumonia.  Despite maximal medical management patient continues to have significant hypoxemia with difficulty weaning oxygen requirement.  PCCM consultation for further evaluation management.  10/22/24- patient s/p HD today with noted obstruction of both ports treated with cathflo. He did not tolerate metaneb therapy and asked to RT to discontinue the treatments.  He is negative 3L fluid balance.  He is on 10L/min HFNC. Moderate left effusion on CT chest is noted.   10/24/27- Patient with no overnight events.  His bloodwork shows improved hemoglobin.  He is still on elevated settings with HFNC.  I've ordered thoracentesis for left moderate pleural effusion.  Patient is on doxy/zosyn .  He's on low dose prednisone .   10/24/24- patient on HFNC 50%, he reports breathing is not better. He had diagnostic thoracentesis.  So far gram stain and culture is negative.  He is in no distress.  He was not able to have therpeutic removal of fluid with thoracentesis due to body habitus.  He's eating breakfast well and speaking in full sentences  without struggling.  He has ESRD with fluid/edema everywhere. Overall prognosis is poor. Im repeating CXR for interval changes on edema/effusion.  He remains on lasix  60 bid with HD and doxy bid  10/27/24- patient laying in bed in no acute distress.  He continues to have mild to moderate dyspnea. He had thoracentesis done to evaluate effusion. He is on lasix  60 bid and Vancomycin  with dialysis.  He is on 4L/min [place]   10/28/24- patient seen at bedside.  He continues to have atelectasis and episodic desaturations.  He refused to work with RT chestphysiotherapy metaneb for recruitment which I believe would greatly help his oxygenation.  He had left sided thoracentesis done 2 days ago with transudative monocyte predominant fluid with negative gram stain and culture consistent with non infected simple effusion likely due to ESRD.  Im repeating CXR today. Plan to continue PT/OT/CP/HD with diuresis  PAST MEDICAL HISTORY   Past Medical History:  Diagnosis Date   Anxiety    Depression    Diabetes mellitus without complication (HCC)    End stage renal disease (HCC)    Hepatitis C    Hyperlipidemia    IBS (irritable bowel syndrome)    Osteomyelitis (HCC)    Spinal stenosis      SURGICAL HISTORY   Past Surgical History:  Procedure Laterality Date   BACK SURGERY     bilateral amputation Bilateral    BKA Bilateral    CENTRAL LINE INSERTION-TUNNELED N/A 02/02/2019   Procedure: CENTRAL LINE INSERTION-TUNNELED;  Surgeon:  Marea Selinda RAMAN, MD;  Location: [facility];  Service: Cardiovascular;  Laterality: N/A;   COLONOSCOPY WITH PROPOFOL  N/A 01/12/2020   Procedure: COLONOSCOPY WITH PROPOFOL ;  Surgeon: Jinny Carmine, MD;  Location: [facility];  Service: Endoscopy;  Laterality: N/A;   DIALYSIS/PERMA CATHETER INSERTION N/A 05/22/2024   Procedure: DIALYSIS/PERMA CATHETER INSERTION;  Surgeon: Jama Cordella MATSU, MD;  Location: [facility];  Service: Cardiovascular;  Laterality: N/A;    DIALYSIS/PERMA CATHETER INSERTION N/A 07/21/2024   Procedure: DIALYSIS/PERMA CATHETER INSERTION;  Surgeon: Marea Selinda RAMAN, MD;  Location: [facility];  Service: Cardiovascular;  Laterality: N/A;   DIALYSIS/PERMA CATHETER INSERTION N/A 10/05/2024   Procedure: DIALYSIS/PERMA CATHETER INSERTION;  Surgeon: Marea Selinda RAMAN, MD;  Location: [facility];  Service: Cardiovascular;  Laterality: N/A;   DIALYSIS/PERMA CATHETER INSERTION Left 10/28/2024   Procedure: DIALYSIS/PERMA CATHETER INSERTION;  Surgeon: Marea Selinda RAMAN, MD;  Location: [facility];  Service: Cardiovascular;  Laterality: Left;   DIALYSIS/PERMA CATHETER REMOVAL N/A 10/01/2024   Procedure: DIALYSIS/PERMA CATHETER REMOVAL;  Surgeon: Marea Selinda RAMAN, MD;  Location: [facility];  Service: Cardiovascular;  Laterality: N/A;   ESOPHAGOGASTRODUODENOSCOPY (EGD) WITH PROPOFOL  N/A 12/07/2019   Procedure: ESOPHAGOGASTRODUODENOSCOPY (EGD) WITH PROPOFOL ;  Surgeon: Unk Corinn Skiff, MD;  Location: [facility];  Service: Gastroenterology;  Laterality: N/A;   IR CATHETER TUBE CHANGE  12/04/2019   SPINAL FUSION     THORACIC SPINE SURGERY  01/2019   extensive washout     FAMILY HISTORY   Family History  Problem Relation Age of Onset   Kidney failure Father    Kidney failure Brother      SOCIAL HISTORY   Social History[1]   MEDICATIONS    Home Medication:    Current Medication: Current Medications[2]    ALLERGIES   Patient has no known allergies.     REVIEW OF SYSTEMS    Review of Systems:  Gen:  Denies  fever, sweats, chills weigh loss  HEENT: Denies blurred vision, double vision, ear pain, eye pain, hearing loss, nose bleeds, sore throat Cardiac:  No dizziness, chest pain or heaviness, chest tightness,edema Resp:   reports dyspnea chronically  Gi: Denies swallowing difficulty, stomach pain, nausea or vomiting, diarrhea, constipation, bowel incontinence Gu:  Denies bladder incontinence, burning  urine Ext:   Denies Joint pain, stiffness or swelling Skin: Denies  skin rash, easy bruising or bleeding or hives Endoc:  Denies polyuria, polydipsia , polyphagia or weight change Psych:   Denies depression, insomnia or hallucinations   Other:  All other systems negative   VS: BP 139/67 (BP Location: Left Wrist)   Pulse 71   Temp 97.9 F (36.6 C) (Oral)   Resp 20   Wt 122.7 kg   SpO2 98%   BMI 38.81 kg/m      PHYSICAL EXAM    GENERAL:NAD, no fevers, chills, no weakness no fatigue HEAD: Normocephalic, atraumatic.  EYES: Pupils equal, round, reactive to light. Extraocular muscles intact. No scleral icterus.  MOUTH: Moist mucosal membrane. Dentition intact. No abscess noted.  EAR, NOSE, THROAT: Clear without exudates. No external lesions.  NECK: Supple. No thyromegaly. No nodules. No JVD.  PULMONARY: decreased breath sounds with mild rhonchi worse at bases bilaterally.  CARDIOVASCULAR: S1 and S2. Regular rate and rhythm. No murmurs, rubs, or gallops. No edema. Pedal pulses 2+ bilaterally.  GASTROINTESTINAL: Soft, nontender, nondistended. No masses. Positive bowel sounds. No hepatosplenomegaly.  MUSCULOSKELETAL: No swelling, clubbing, or edema. Range of  motion full in all extremities.  NEUROLOGIC: Cranial nerves II through XII are intact. No gross focal neurological deficits. Sensation intact. Reflexes intact.  SKIN: No ulceration, lesions, rashes, or cyanosis. Skin warm and dry. Turgor intact.  PSYCHIATRIC: Mood, affect within normal limits. The patient is awake, alert and oriented x 3. Insight, judgment intact.       IMAGING     ASSESSMENT/PLAN   Acute on chronic hypoxemic respiratory failure Patient with multiple etiologies of hypoxemia including  1. Pulmonary edema 2. Pleural effusions 3. Atelectasis 4. CHF 5. Restrictive ventilatory defect due to scoliosis 6. COPD 7. Morbid obesity  8. Physical deconditioning   Pulmonary edema - continue dialysis, net  negative goal               - continue lasix  IV ESRD status  2. Pleural effusions - continue with HD, IR consult -  diagnostic thoracentesis 3. Atelectasis - ordering Chest PT with metaneb 4. Possible COPD exacerbation - noted bacterial and viral workup is in progress.  Continue doxycycline /cefepime  for now 4. CHF - acute on chronic - continue metoprolol  , lasix   5. Obesity - dietary changes, lifestyle modification 6. Physical deconditioning - contributing to dyspnea            Thank you for allowing me to participate in the care of this patient.   Patient/Family are satisfied with care plan and all questions have been answered.    Provider disclosure: Patient with at least one acute or chronic illness or injury that poses a threat to life or bodily function and is being managed actively during this encounter.  All of the below services have been performed independently by signing provider:  review of prior documentation from internal and or external health records.  Review of previous and current lab results.  Interview and comprehensive assessment during patient visit today. Review of current and previous chest radiographs/CT scans. Discussion of management and test interpretation with health care team and patient/family.   This document was prepared using Dragon voice recognition software and may include unintentional dictation errors.     Robie Oats, M.D.  Division of Pulmonary & Critical Care Medicine                   [1]  Social History Tobacco Use   Smoking status: Former    Current packs/day: 0.25    Types: Cigarettes   Smokeless tobacco: Never  Vaping Use   Vaping status: Every Day   Substances: Nicotine   Substance Use Topics   Alcohol use: Not Currently   Drug use: Not Currently  [2]  Current Facility-Administered Medications:    acetaminophen  (TYLENOL ) tablet 650 mg, 650 mg, Oral, Q6H PRN, Dew, Jason S, MD   bisacodyl  (DULCOLAX) EC  tablet 10 mg, 10 mg, Oral, QHS, Dew, Jason S, MD, 10 mg at 10/26/24 2229   bisacodyl  (DULCOLAX) suppository 10 mg, 10 mg, Rectal, Daily PRN, Marea Selinda RAMAN, MD   calcitRIOL  (ROCALTROL ) capsule 0.5 mcg, 0.5 mcg, Oral, Daily, Dew, Jason S, MD, 0.5 mcg at 10/29/24 0840   calcium  acetate (PHOSLO ) capsule 1,334 mg, 1,334 mg, Oral, TID WC, Dew, Jason S, MD, 1,334 mg at 10/29/24 0841   carvedilol  (COREG ) tablet 6.25 mg, 6.25 mg, Oral, BID WC, Dew, Jason S, MD, 6.25 mg at 10/29/24 0841   Chlorhexidine  Gluconate Cloth 2 % PADS 6 each, 6 each, Topical, Q0600, Marea Selinda RAMAN, MD, 6 each at 10/27/24 0521   diltiazem  (CARDIZEM  CD) 24 hr  capsule 240 mg, 240 mg, Oral, QPM, Dew, Jason S, MD, 240 mg at 10/28/24 1749   doxepin  (SINEQUAN ) capsule 10 mg, 10 mg, Oral, QHS, Dew, Jason S, MD, 10 mg at 10/28/24 2118   epoetin  alfa-epbx (RETACRIT ) injection 4,000 Units, 4,000 Units, Intravenous, Q M,W,F-1800, Dew, Jason S, MD, 4,000 Units at 10/28/24 1554   famotidine  (PEPCID ) tablet 10 mg, 10 mg, Oral, Daily, Dew, Jason S, MD, 10 mg at 10/29/24 0841   fluticasone  furoate-vilanterol (BREO ELLIPTA ) 200-25 MCG/ACT 1 puff, 1 puff, Inhalation, Daily, Dew, Jason S, MD, 1 puff at 10/29/24 0846   furosemide  (LASIX ) injection 60 mg, 60 mg, Intravenous, BID, Dew, Jason S, MD, 60 mg at 10/29/24 9157   gabapentin  (NEURONTIN ) capsule 300 mg, 300 mg, Oral, QHS, Dew, Jason S, MD, 300 mg at 10/28/24 2117   guaiFENesin  (MUCINEX ) 12 hr tablet 600 mg, 600 mg, Oral, BID, Dew, Jason S, MD, 600 mg at 10/29/24 0841   heparin  injection 5,000 Units, 5,000 Units, Subcutaneous, Q12H, Dew, Jason S, MD, 5,000 Units at 10/29/24 9157   insulin  aspart (novoLOG ) injection 0-9 Units, 0-9 Units, Subcutaneous, TID WC, Dew, Jason S, MD, 7 Units at 10/29/24 9156   insulin  aspart (novoLOG ) injection 6 Units, 6 Units, Subcutaneous, TID WC, Dezii, Alexandra, DO, 6 Units at 10/29/24 0842   insulin  glargine-yfgn injection 35 Units, 35 Units, Subcutaneous, BID, Dew,  Jason S, MD, 35 Units at 10/28/24 2117   ipratropium-albuterol  (DUONEB) 0.5-2.5 (3) MG/3ML nebulizer solution 3 mL, 3 mL, Nebulization, Q4H PRN, Marea Selinda RAMAN, MD, 3 mL at 10/27/24 1359   lactulose  (CHRONULAC ) 10 GM/15ML solution 20 g, 20 g, Oral, BID, Dew, Selinda RAMAN, MD, 20 g at 10/27/24 2205   levothyroxine  (SYNTHROID ) tablet 175 mcg, 175 mcg, Oral, QHS, Dew, Jason S, MD, 175 mcg at 10/28/24 2116   linaclotide  (LINZESS ) capsule 145 mcg, 145 mcg, Oral, QAC breakfast, Dew, Jason S, MD, 145 mcg at 10/29/24 0840   loratadine  (CLARITIN ) tablet 10 mg, 10 mg, Oral, Daily, Dew, Jason S, MD, 10 mg at 10/29/24 0841   melatonin tablet 10 mg, 10 mg, Oral, QHS, Dew, Jason S, MD, 10 mg at 10/28/24 2116   metoCLOPramide  (REGLAN ) injection 5 mg, 5 mg, Intravenous, Q6H PRN, Marea Selinda RAMAN, MD, 5 mg at 10/26/24 9158   midodrine  (PROAMATINE ) tablet 20 mg, 20 mg, Oral, Once per day on Monday Wednesday Friday, Dew, Selinda RAMAN, MD, 20 mg at 10/23/24 1018   naloxegol  oxalate (MOVANTIK ) tablet 12.5 mg, 12.5 mg, Oral, Daily, Dew, Jason S, MD, 12.5 mg at 10/28/24 2116   ondansetron  (ZOFRAN ) injection 4 mg, 4 mg, Intravenous, Q6H PRN, Marea Selinda RAMAN, MD, 4 mg at 10/29/24 9144   Oral care mouth rinse, 15 mL, Mouth Rinse, PRN, Dew, Selinda RAMAN, MD   oxyCODONE  (Oxy IR/ROXICODONE ) immediate release tablet 5 mg, 5 mg, Oral, Q6H PRN, Dew, Jason S, MD, 5 mg at 10/29/24 0855   polyethylene glycol (MIRALAX  / GLYCOLAX ) packet 17 g, 17 g, Oral, BID, Dew, Jason S, MD, 17 g at 10/25/24 1023   predniSONE  (DELTASONE ) tablet 5 mg, 5 mg, Oral, Q breakfast, Dew, Selinda RAMAN, MD, 5 mg at 10/29/24 0841   senna-docusate (Senokot-S) tablet 1 tablet, 1 tablet, Oral, BID, Marea Selinda RAMAN, MD, 1 tablet at 10/29/24 0841   simethicone  (MYLICON) chewable tablet 80 mg, 80 mg, Oral, QID, Dew, Jason S, MD, 80 mg at 10/29/24 0841   sodium chloride  flush (NS) 0.9 % injection 3 mL, 3 mL, Intravenous,  Q12H, Marea Selinda RAMAN, MD, 3 mL at 10/29/24 0848   sodium chloride  flush (NS)  0.9 % injection 3 mL, 3 mL, Intravenous, PRN, Dew, Jason S, MD   sorbitol , magnesium  hydroxide, mineral oil, glycerin  (SMOG) enema, 960 mL, Rectal, Once, Dew, Selinda RAMAN, MD   umeclidinium bromide  (INCRUSE ELLIPTA ) 62.5 MCG/ACT 1 puff, 1 puff, Inhalation, Daily, Dew, Selinda RAMAN, MD, 1 puff at 10/29/24 0846   vancomycin  (VANCOCIN ) IVPB 1000 mg/200 mL premix, 1,000 mg, Intravenous, Q M,W,F-HD, Marea Selinda RAMAN, MD, Stopped at 10/28/24 1724  "

## 2024-10-29 NOTE — Progress Notes (Signed)
 Bipap order changed to PRN due to patient refusing to wear at night. Care team notified and requested to have it changed to PRN

## 2024-10-29 NOTE — Discharge Summary (Signed)
 " DISCHARGE SUMMARY    Eugene [name] FMW:[id number] DOB: [birth year] DOA: 10/19/2024  PCP: [place]  Admit date: 10/19/2024 Discharge date: 10/29/2024   Recommendations for Outpatient Follow-up:  Follow up with PCP in 1-2 weeks to continue chronic condition management Follow-up closely with pulmonology   Hospital Course: Eugene [name] is a 64 year old male with extensive past medical history including osteomyelitis, spinal stenosis, IBS, dyslipidemia, HCV, ESRD, diabetes, depression, anxiety, MRSA bacteremia status post vancomycin , chronic hypoxia, bilateral BKA, chronic diastolic heart failure.  He presents to the hospital due to worsening hypoxemia requiring 15 L upon arrival in the ED.  He was found to be febrile with worsening hemoglobin and renal function.  CXR revealed bilateral multifocal pneumonia.  Patient underwent hemodialysis on 1/29 but had noted obstruction of both ports treated with Cathflo.  He was started on MetaNebs though he did not tolerate it.  Moderate left effusion seen on CT chest.  Thoracentesis was ordered.  He was started on antibiotics. 1/30 he underwent thoracentesis for diagnostic purposes but was unable to have therapeutic removal of fluid due to body habitus.  Kae revealed likely simple effusion due to ESRD. Stay has been further complicated by refusing to do chest physiotherapy.  Pulmonology has been assisting. By 2/5 patient is demanding his return to [facility].  He refuses to allow oxygen to be turned down.  Currently on 8 L which he reports this is baseline.  Of note, goal O2 sats 88 to 91% and he can tolerate much lower amounts as 8L currently has him on 99%.  He is aware of this.   Acute on chronic hypoxic respiratory failure Acute on chronic heart failure with preserved EF, decompensation - Secondary to volume overload and COPD exacerbation - s/p HD - Did have fever and CXR with bilateral infiltrates most compatible with  pulmonary congestion.  Atypical pneumonia cannot be ruled out.  Has been covered with doxycycline  - RVP negative - Pulmonology consulted - 1/29 CT chest: Improving pulmonary edema, bilateral lower lobe atelectasis, pleural effusion right, mild, moderate left - 1/30 left thoracentesis, could not find adequate window to drain entirety of fluid.  15 mL diagnostic fluid obtained - Status post Solu-Medrol . Now home prednisone . - Patient reports he is chronically on 8 L at home, resides at [facility] - Goal O2 88 to 91%   COPD exacerbation - On Breo Ellipta  and Incruse Ellipta , continue at DC - As needed DuoNebs - Steroid therapy - Follow with outpatient pulmonology   Chronic pain - Continue home meds   UTI - Urine culture with Enterococcus and Proteus - Initially on vancomycin , 1/29 changed to Zosyn  to cover for VRE   Chronic constipation - Failed laxative trials.  On naloxegol  - Titrate laxatives into 1-2 bowel movements daily   Hyperkalemia, resolved - Monitor daily labs.  HD as needed   MRSA bacteremia - Meant to be on vancomycin  MWF, last dose November 11, 2024.  Continue with the same   Insulin -dependent type 2 diabetes - Continue basal/bolus, titrate as needed. - Worsening hyperglycemia secondary to steroids   ESRD on HD - Nephrology consulted as above   Malfunctioning dialysis catheter - PermCath replaced 2/4 with vascular surgery.  New TDC placed in left IJ.  Subsequently received HD without issue   History of paroxysmal SVT - Continue home meds Cardizem  and Coreg    Body mass index is 39.03 kg/m. Obesity Class II - Outpatient follow up for lifestyle modification and risk factor management  Discharge Instructions  Discharge Instructions     Call MD for:  difficulty breathing, headache or visual disturbances   Complete by: As directed    Call MD for:  persistant dizziness or light-headedness   Complete by: As directed    Call MD for:  persistant  nausea and vomiting   Complete by: As directed    Call MD for:  severe uncontrolled pain   Complete by: As directed    Call MD for:  temperature >100.4   Complete by: As directed    Diet general   Complete by: As directed    Discharge instructions   Complete by: As directed    Follow up with your primary care physician to discuss the medication changes during this admission   Increase activity slowly   Complete by: As directed    No wound care   Complete by: As directed       Allergies as of 10/29/2024   No Known Allergies      Medication List     STOP taking these medications    linaclotide  145 MCG Caps capsule Commonly known as: LINZESS    liver oil-zinc  oxide 40 % ointment Commonly known as: DESITIN       TAKE these medications    acetaminophen  325 MG tablet Commonly known as: TYLENOL  Take 650 mg by mouth in the morning and at bedtime.   bisacodyl  10 MG suppository Commonly known as: DULCOLAX Place 10 mg rectally daily as needed for moderate constipation.   bisacodyl  5 MG EC tablet Commonly known as: DULCOLAX Take 2 tablets (10 mg total) by mouth at bedtime.   calcitRIOL  0.5 MCG capsule Commonly known as: ROCALTROL  Take 1 capsule (0.5 mcg total) by mouth daily.   carvedilol  6.25 MG tablet Commonly known as: COREG  Take 1 tablet (6.25 mg total) by mouth 2 (two) times daily with a meal.   cetirizine 10 MG tablet Commonly known as: ZYRTEC Take 10 mg by mouth daily.   cyanocobalamin  1000 MCG tablet Take 1 tablet (1,000 mcg total) by mouth daily.   diltiazem  240 MG 24 hr capsule Commonly known as: CARDIZEM  CD Take 1 capsule (240 mg total) by mouth every evening.   Doxepin  HCl 6 MG Tabs Take 6 mg by mouth at bedtime.   famotidine  10 MG tablet Commonly known as: PEPCID  Take 1 tablet (10 mg total) by mouth daily.   fluticasone  furoate-vilanterol 200-25 MCG/ACT Aepb Commonly known as: BREO ELLIPTA  Inhale 1 puff into the lungs daily. Start taking  on: October 30, 2024   folic acid  1 MG tablet Commonly known as: FOLVITE  Take 1 tablet (1 mg total) by mouth daily.   furosemide  80 MG tablet Commonly known as: LASIX  Take 1 tablet (80 mg total) by mouth 2 (two) times daily. What changed: when to take this   gabapentin  300 MG capsule Commonly known as: NEURONTIN  Take 300 mg by mouth at bedtime.   guaifenesin  100 MG/5ML syrup Commonly known as: ROBITUSSIN Take 200 mg by mouth 3 (three) times daily as needed for cough.   insulin  glargine-yfgn 100 UNIT/ML injection Inject 0.35 mLs (35 Units total) into the skin 2 (two) times daily. What changed: how much to take   insulin  lispro 100 UNIT/ML injection Commonly known as: HUMALOG  Inject 0-15 Units into the skin 4 (four) times daily -  before meals and at bedtime. <150= 0 units 151-200= 3 units 201-250= 6 units 251-300= 9 units 301-350= 12 units 351-400= 15 units >400 call MD  ipratropium-albuterol  0.5-2.5 (3) MG/3ML Soln Commonly known as: DUONEB Take 3 mLs by nebulization every 6 (six) hours as needed.   Lactulose  20 GM/30ML Soln Take 30 mLs (20 g total) by mouth 2 (two) times daily.   levothyroxine  175 MCG tablet Commonly known as: SYNTHROID  Take 175 mcg by mouth at bedtime.   melatonin 5 MG Tabs Take 10 mg by mouth at bedtime.   metoCLOPramide  10 MG tablet Commonly known as: REGLAN  Take 0.5 tablets (5 mg total) by mouth every 8 (eight) hours as needed for nausea or vomiting.   midodrine  5 MG tablet Commonly known as: PROAMATINE  Take 20 mg by mouth. Monday, Wednesday and Friday   naloxegol  oxalate 12.5 MG Tabs tablet Commonly known as: MOVANTIK  Take 1 tablet (12.5 mg total) by mouth daily.   ondansetron  4 MG tablet Commonly known as: ZOFRAN  Take 4 mg by mouth every 8 (eight) hours as needed for nausea or vomiting.   oxyCODONE  15 MG immediate release tablet Commonly known as: ROXICODONE  Take 0.5 tablets (7.5 mg total) by mouth every 6 (six) hours as  needed.   polyethylene glycol 17 g packet Commonly known as: MIRALAX  / GLYCOLAX  Take 17 g by mouth 2 (two) times daily.   predniSONE  10 MG tablet Commonly known as: DELTASONE  Take 10 mg by mouth daily with breakfast.   senna-docusate 8.6-50 MG tablet Commonly known as: Senokot-S Take 1 tablet by mouth 2 (two) times daily.   simethicone  80 MG chewable tablet Commonly known as: MYLICON Chew 1 tablet (80 mg total) by mouth 4 (four) times daily.   umeclidinium bromide  62.5 MCG/ACT Aepb Commonly known as: INCRUSE ELLIPTA  Inhale 1 puff into the lungs daily. Start taking on: October 30, 2024   vancomycin  IVPB Inject 1,000 mg into the vein every Monday, Wednesday, and Friday for 28 days. Vancomycin  1000mg  IV MWF with hemodialysis Indication:  MRSA bacteremia Last Day of Therapy:  11/11/2024 Labs - Monday:  CBC/D, CMP, and pre-HD vancomycin  level Fax weekly lab results  promptly to [phone] Call [phone] with questions or critical value To be given at HD center   Vitamin D  (Ergocalciferol ) 1.25 MG (50000 UNIT) Caps capsule Commonly known as: DRISDOL  Take 1 capsule (50,000 Units total) by mouth every 7 (seven) days.        Allergies[1]  Consultations: Treatment Team:  Aleskerov, Fuad, MD   Procedures/Studies: PERIPHERAL VASCULAR CATHETERIZATION Result Date: 10/28/2024 See surgical note for result.  DG Abd 1 View Result Date: 10/26/2024 CLINICAL DATA:  Small-bowel obstruction, chronic ileus and constipation EXAM: ABDOMEN - 1 VIEW COMPARISON:  Prior abdominal radiograph 10/24/2024 FINDINGS: A total of 3 frontal radiographs were obtained to encompass the entirety of the abdomen. No evidence of small-bowel obstruction. Scattered gas within the stomach and throughout the colon. Unremarkable colonic stool burden. Partially imaged bilateral pleural effusions and associated atelectasis versus infiltrate. IMPRESSION: 1. No evidence of small-bowel obstruction. 2. Scattered gas  in the stomach and throughout the colon. 3. Unremarkable colonic stool burden. 4. Partially imaged bilateral pleural effusions with associated atelectasis versus infiltrate. Electronically Signed   By: Wilkie Lent M.D.   On: 10/26/2024 09:16   DG Abd 1 View Result Date: 10/24/2024 EXAM: 1 VIEW XRAY OF THE ABDOMEN 10/24/2024 06:51:00 PM COMPARISON: 10/21/2024 CLINICAL HISTORY: Small bowel obstruction (HCC). FINDINGS: BOWEL: Gaseous distention of loops of small bowel in the right abdomen up to 3.5 cm, consistent with possible recurrent obstruction or ileus. Moderate volume of stool in the left hemicolon. SOFT TISSUES: No  abnormal calcifications. BONES: Thoracic spinal fusion hardware in place. No acute fracture. LUNGS AND PLEURAL SPACES: Persistent left pleural effusion and associated left basilar opacity. IMPRESSION: 1. Gaseous distention of loops of small bowel in the right abdomen up to 3.5 cm, which may reflect recurrent obstruction or ileus. 2. Moderate volume of stool in the left hemicolon. 3. Persistent left pleural effusion with associated left basilar opacity. Electronically signed by: Greig Pique MD 10/24/2024 07:00 PM EST RP Workstation: [id number]   US  CHEST (PLEURAL EFFUSION) Result Date: 10/24/2024 CLINICAL DATA:  Limited ultrasound of the chest done prior to possible thoracentesis. EXAM: CHEST ULTRASOUND COMPARISON:  CT CHEST WO CONTRAST-10/22/2024 FINDINGS: There is only a small pleural effusion present with no window to allow for safe approach for thoracentesis. IMPRESSION: Small pleural effusion. No window to allow for safe approach for thoracentesis. Ultrasound performed by: Sherrilee Bal, PA-C Electronically Signed   By: Wilkie Lent M.D.   On: 10/24/2024 09:23   US  THORACENTESIS LEFT ASP PLEURAL SPACE W/IMG GUIDE Result Date: 10/24/2024 INDICATION: 63 year old male with a history of ESRD on hemodialysis and heart failure currently admitted for management of acute on chronic  hypoxic respiratory failure and heart failure exacerbation with concern for atypical pneumonia. Request for diagnostic and therapeutic left-sided thoracentesis. EXAM: ULTRASOUND GUIDED DIAGNOSTIC AND THERAPEUTIC, LEFT-SIDED THORACENTESIS MEDICATIONS: 1% lidocaine , 8 mL. COMPLICATIONS: None immediate. PROCEDURE: An ultrasound guided thoracentesis was thoroughly discussed with the patient and questions answered. The benefits, risks, alternatives and complications were also discussed. The patient understands and wishes to proceed with the procedure. Written consent was obtained. Ultrasound was performed to localize and mark an adequate pocket of fluid in the left chest. The area was then prepped and draped in the normal sterile fashion. 1% Lidocaine  was used for local anesthesia. Under ultrasound guidance a 6 Fr Safe-T-Centesis catheter was introduced. Initial aspiration yielded approximately 15 mL of clear yellow fluid; however, drainage stopped shortly thereafter. Multiple attempts were made to readjust the catheter to allow for additional drainage, but were unsuccessful. The catheter was removed and a dressing applied. FINDINGS: A total of approximately 15 mL of clear yellow fluid was removed. Samples were sent to the laboratory as requested by the clinical team. IMPRESSION: Successful ultrasound guided left-sided thoracentesis yielding 15 mL of pleural fluid. Electronically Signed   By: Wilkie Lent M.D.   On: 10/24/2024 09:23   DG Chest Port 1 View Result Date: 10/24/2024 EXAM: 1 VIEW(S) XRAY OF THE CHEST 10/24/2024 08:33:00 AM COMPARISON: 10/23/2024 CLINICAL HISTORY: Pleural effusion. FINDINGS: LINES, TUBES AND DEVICES: Left internal jugular dual lumen central venous catheter stable in position. LUNGS AND PLEURA: Stable moderate left pleural effusion. Stable small right pleural effusion. Left basilar opacity. Similar degree of mild pulmonary edema. No pneumothorax. HEART AND MEDIASTINUM: Stable  cardiomegaly. No acute abnormality of the mediastinal silhouette. BONES AND SOFT TISSUES: Thoracic spinal fixation hardware noted. No acute osseous abnormality. IMPRESSION: 1. Stable moderate left pleural effusion and small right pleural effusion. 2. Left basilar opacity, unchanged. 3. Similar degree of mild pulmonary edema. Electronically signed by: Waddell Calk MD 10/24/2024 09:07 AM EST RP Workstation: [id number]   DG Chest Port 1 View Result Date: 10/23/2024 EXAM: 1 VIEW(S) XRAY OF THE CHEST 10/23/2024 04:21:00 PM COMPARISON: 10/19/2024 CLINICAL HISTORY: Pleural effusion on left. Status post thoracentesis. ICD 910 [id number] Pleural effusion on left; ICD 758137 Status post thoracentesis. FINDINGS: LINES, TUBES AND DEVICES: Stable left IJ CVC (internal jugular central venous catheter) with tip projecting over the confluence of the left  brachiocephalic vein and SVC (superior vena cava), tip directed towards the lateral wall. LUNGS AND PLEURA: Diffuse interstitial opacities. Stable moderate left pleural effusion. Trace right pleural effusion. No pneumothorax. HEART AND MEDIASTINUM: Persistent cardiomegaly. No acute abnormality of the mediastinal silhouette. BONES AND SOFT TISSUES: Lower thoracic spine fixation hardware noted. No acute osseous abnormality. IMPRESSION: 1. Stable moderate left pleural effusion and trace right pleural effusion. 2. Diffuse interstitial opacities. 3. Persistent cardiomegaly. Electronically signed by: Katheleen Faes MD 10/23/2024 04:48 PM EST RP Workstation: [id number]   CT CHEST WO CONTRAST Result Date: 10/22/2024 CLINICAL DATA:  Respiratory illness and bowel obstruction EXAM: CT CHEST, ABDOMEN AND PELVIS WITHOUT CONTRAST TECHNIQUE: Multidetector CT imaging of the chest, abdomen and pelvis was performed following the standard protocol without IV contrast. RADIATION DOSE REDUCTION: This exam was performed according to the departmental dose-optimization program which includes automated  exposure control, adjustment of the mA and/or kV according to patient size and/or use of iterative reconstruction technique. COMPARISON:  CT abdomen and pelvis dated 09/30/2024, CTA chest dated 10/06/2024 FINDINGS: CT CHEST FINDINGS Cardiovascular: Left IJ central venous catheter terminates in the upper SVC. Multichamber cardiomegaly. No significant pericardial fluid/thickening. Dilated main pulmonary artery measures 4.0 cm. Coronary artery calcifications. Mediastinum/Nodes: Imaged thyroid gland without nodules meeting criteria for imaging follow-up by size. Normal esophagus. 10 mm AP window lymph node (2:24). Lungs/Pleura: The central airways are patent. Adherent secretions within the lower trachea extending into right main bronchus. Mild diffuse bronchial wall thickening. Decreased geographic ground-glass opacities with persistent mild interlobular septal thickening. Increased near-complete round atelectasis of the bilateral lower lobes and increased subsegmental atelectasis of the lingula. No pneumothorax. Unchanged small right and moderate left pleural effusions with associated pleural thickening. Musculoskeletal: No acute or abnormal lytic or blastic osseous lesions. Thoracic spinal fusion hardware spanning T5-T11 appears intact. Unchanged chronic compression fracture of T8 and superior endplate compression deformity of T12. CT ABDOMEN PELVIS FINDINGS Hepatobiliary: No focal hepatic lesions. No intra or extrahepatic biliary ductal dilation. Normal gallbladder. Pancreas: No focal lesions or main ductal dilation. Spleen: Normal in size without focal abnormality. Adrenals/Urinary Tract: No adrenal nodules. No suspicious renal mass, calculi, or hydronephrosis. Urinary bladder is decompressed with suprapubic catheter in-situ. Stomach/Bowel: Normal appearance of the stomach. No evidence of bowel wall thickening, distention, or inflammatory changes. Mild circumferential mural thickening of the rectum. Large volume  stool within the rectosigmoid colon. Moderate volume stool throughout the remainder of the colon. The sigmoid colon is deviated into the right lower quadrant. Normal appendix. Vascular/Lymphatic: No significant vascular findings are present. Unchanged 10 mm left para-aortic lymph node (2: 82). Reproductive: Prostate is unremarkable. Other: No free fluid, fluid collection, or free air. Musculoskeletal: No acute or abnormal lytic or blastic osseous findings. Small fat-containing paraumbilical hernia. Unchanged L1 anterior wedging. IMPRESSION: CT CHEST: 1. Improving pulmonary edema. 2. Increased near-complete round atelectasis of the bilateral lower lobes and increased subsegmental atelectasis of the lingula. 3. Unchanged small right and moderate left pleural effusions with associated pleural thickening, which may reflect chronic loculation. Recommend correlation with results of recent thoracentesis for evaluation of empyema. 4. Dilated main pulmonary artery measures 4.0 cm, which can be seen in the setting of pulmonary arterial hypertension. 5. A 10 mm AP window lymph node, likely reactive. CT ABDOMEN AND PELVIS: 1. Large volume stool within the rectosigmoid colon with mild circumferential mural thickening of the rectum, which may reflect stercoral colitis. Moderate volume stool throughout the remainder of the colon. 2. Small fat-containing paraumbilical hernia. Electronically Signed  By: Limin  Xu M.D.   On: 10/22/2024 17:13   CT ABDOMEN PELVIS WO CONTRAST Result Date: 10/22/2024 CLINICAL DATA:  Respiratory illness and bowel obstruction EXAM: CT CHEST, ABDOMEN AND PELVIS WITHOUT CONTRAST TECHNIQUE: Multidetector CT imaging of the chest, abdomen and pelvis was performed following the standard protocol without IV contrast. RADIATION DOSE REDUCTION: This exam was performed according to the departmental dose-optimization program which includes automated exposure control, adjustment of the mA and/or kV according to  patient size and/or use of iterative reconstruction technique. COMPARISON:  CT abdomen and pelvis dated 09/30/2024, CTA chest dated 10/06/2024 FINDINGS: CT CHEST FINDINGS Cardiovascular: Left IJ central venous catheter terminates in the upper SVC. Multichamber cardiomegaly. No significant pericardial fluid/thickening. Dilated main pulmonary artery measures 4.0 cm. Coronary artery calcifications. Mediastinum/Nodes: Imaged thyroid gland without nodules meeting criteria for imaging follow-up by size. Normal esophagus. 10 mm AP window lymph node (2:24). Lungs/Pleura: The central airways are patent. Adherent secretions within the lower trachea extending into right main bronchus. Mild diffuse bronchial wall thickening. Decreased geographic ground-glass opacities with persistent mild interlobular septal thickening. Increased near-complete round atelectasis of the bilateral lower lobes and increased subsegmental atelectasis of the lingula. No pneumothorax. Unchanged small right and moderate left pleural effusions with associated pleural thickening. Musculoskeletal: No acute or abnormal lytic or blastic osseous lesions. Thoracic spinal fusion hardware spanning T5-T11 appears intact. Unchanged chronic compression fracture of T8 and superior endplate compression deformity of T12. CT ABDOMEN PELVIS FINDINGS Hepatobiliary: No focal hepatic lesions. No intra or extrahepatic biliary ductal dilation. Normal gallbladder. Pancreas: No focal lesions or main ductal dilation. Spleen: Normal in size without focal abnormality. Adrenals/Urinary Tract: No adrenal nodules. No suspicious renal mass, calculi, or hydronephrosis. Urinary bladder is decompressed with suprapubic catheter in-situ. Stomach/Bowel: Normal appearance of the stomach. No evidence of bowel wall thickening, distention, or inflammatory changes. Mild circumferential mural thickening of the rectum. Large volume stool within the rectosigmoid colon. Moderate volume stool  throughout the remainder of the colon. The sigmoid colon is deviated into the right lower quadrant. Normal appendix. Vascular/Lymphatic: No significant vascular findings are present. Unchanged 10 mm left para-aortic lymph node (2: 82). Reproductive: Prostate is unremarkable. Other: No free fluid, fluid collection, or free air. Musculoskeletal: No acute or abnormal lytic or blastic osseous findings. Small fat-containing paraumbilical hernia. Unchanged L1 anterior wedging. IMPRESSION: CT CHEST: 1. Improving pulmonary edema. 2. Increased near-complete round atelectasis of the bilateral lower lobes and increased subsegmental atelectasis of the lingula. 3. Unchanged small right and moderate left pleural effusions with associated pleural thickening, which may reflect chronic loculation. Recommend correlation with results of recent thoracentesis for evaluation of empyema. 4. Dilated main pulmonary artery measures 4.0 cm, which can be seen in the setting of pulmonary arterial hypertension. 5. A 10 mm AP window lymph node, likely reactive. CT ABDOMEN AND PELVIS: 1. Large volume stool within the rectosigmoid colon with mild circumferential mural thickening of the rectum, which may reflect stercoral colitis. Moderate volume stool throughout the remainder of the colon. 2. Small fat-containing paraumbilical hernia. Electronically Signed   By: Limin  Xu M.D.   On: 10/22/2024 17:13   DG Abd 1 View Result Date: 10/21/2024 CLINICAL DATA:  Ileus. EXAM: ABDOMEN - 1 VIEW COMPARISON:  10/14/2024. FINDINGS: Air distended stomach. Dilated gas and stool-filled colon. Stool in the rectosigmoid colon. Visualized lung bases show bilateral pleural effusions. IMPRESSION: Colonic ileus with moderate to severe stool burden. Electronically Signed   By: Newell Eke M.D.   On: 10/21/2024 10:04  DG Chest Port 1 View Result Date: 10/19/2024 EXAM: 1 VIEW(S) XRAY OF THE CHEST 10/19/2024 11:50:22 PM COMPARISON: None available. CLINICAL  HISTORY: Questionable sepsis - evaluate for abnormality. Questionable sepsis. Evaluate for abnormality. FINDINGS: LINES, TUBES AND DEVICES: Left dialysis catheter in similar position with tip likely at the confluence of the left brachiocephalic vein and superior vena cava. LUNGS AND PLEURA: Persistent trace bilateral pleural effusions. Persistent Left retrocardiac airspace opacity. Increased interstitial markings slightly improved. No pneumothorax. HEART AND MEDIASTINUM: No acute abnormality of the cardiac and mediastinal silhouettes. BONES AND SOFT TISSUES: Thoracolumbar surgical hardware. IMPRESSION: 1. Persistent left retrocardiac airspace opacity. 2. Persistent trace bilateral pleural effusions. 3. Increased interstitial markings, slightly improved. Electronically signed by: Morgane Naveau MD 10/19/2024 11:53 PM EST RP Workstation: [id number]   DG Abd 1 View Result Date: 10/14/2024 EXAM: 1 VIEW XRAY OF THE ABDOMEN 10/14/2024 07:13:00 AM COMPARISON: Supine abdomen study yesterday at 9 o'clock am. CLINICAL HISTORY: 01250 Ileus [facility]) [id number] Ileus (HCC) [id number] Ileus (HCC) [id number] Ileus (HCC) [id number] FINDINGS: BOWEL: Nonobstructive bowel gas pattern with moderate fecal stasis. There is no supine evidence of free air. SOFT TISSUES: Small pleural effusions and overlying lung base opacities are present. BONES: Fusion hardware in the thoracic spine is again noted. No acute fracture. IMPRESSION: 1. Nonobstructive bowel gas pattern without supine evidence of pneumoperitoneum; moderate fecal stasis. Unchanged. 2. Small pleural effusions with overlying lung base opacities. Electronically signed by: Francis Quam MD 10/14/2024 07:34 AM EST RP Workstation: [id number]   DG Chest Port 1 View Result Date: 10/13/2024 CLINICAL DATA:  Shortness of breath EXAM: PORTABLE CHEST 1 VIEW COMPARISON:  10/13/2024, 10/11/2024, chest CT 10/06/2024 FINDINGS: Left-sided central venous catheter tip projecting over the brachiocephalic  vein/SVC confluence. Extensive spinal hardware as before. Cardiomegaly with pleural effusions and left greater than right basilar airspace disease, not significantly changed. Background vascular congestion and pulmonary edema. No pneumothorax IMPRESSION: 1. Cardiomegaly with vascular congestion and pulmonary edema. 2. Small bilateral pleural effusions with left greater than right basilar airspace disease, not significantly changed. No pneumothorax is seen Electronically Signed   By: Luke Bun M.D.   On: 10/13/2024 19:36   US  THORACENTESIS LEFT ASP PLEURAL SPACE W/IMG GUIDE Result Date: 10/13/2024 INDICATION: Patient with a history of end-stage renal disease on hemodialysis and heart failure presents today with left pleural effusion. Interventional Radiology asked to perform a diagnostic and therapeutic thoracentesis. EXAM: ULTRASOUND GUIDED THORACENTESIS MEDICATIONS: 1% lidocaine  10 COMPLICATIONS: None immediate. PROCEDURE: An ultrasound guided thoracentesis was thoroughly discussed with the patient and questions answered. The benefits, risks, alternatives and complications were also discussed. The patient understands and wishes to proceed with the procedure. Written consent was obtained. Ultrasound was performed to localize and mark an adequate pocket of fluid in the left chest. The area was then prepped and draped in the normal sterile fashion. 1% Lidocaine  was used for local anesthesia. Under ultrasound guidance a 6 Fr Safe-T-Centesis catheter was introduced. Thoracentesis was performed. The catheter was removed and a dressing applied. FINDINGS: A total of approximately 200 mL of clear yellow fluid was removed. Samples were sent to the laboratory as requested by the clinical team. IMPRESSION: Successful ultrasound guided left thoracentesis yielding 200 mL of pleural fluid. Procedure performed by: Warren Dais, NP Electronically Signed   By: Juliene Balder M.D.   On: 10/13/2024 15:51   DG Chest Port 1  View Result Date: 10/13/2024 CLINICAL DATA:  Status post thoracentesis. EXAM: PORTABLE CHEST 1 VIEW COMPARISON:  Chest radiograph dated  10/11/2024. FINDINGS: Left IJ catheter in similar position. Cardiomegaly with vascular congestion and edema relatively similar to prior radiograph. Small bilateral pleural effusions similar to prior radiograph. No pneumothorax. No acute osseous pathology. Spinal hardware. IMPRESSION: No interval change.  No pneumothorax. Electronically Signed   By: Vanetta Chou M.D.   On: 10/13/2024 15:11   DG Abd 1 View Result Date: 10/13/2024 CLINICAL DATA:  Ileus. EXAM: ABDOMEN - 1 VIEW COMPARISON:  10/12/2024 FINDINGS: Bowel gas pattern is nonobstructive. No free peritoneal air. There is moderate fecal retention over the colon most prominent over the left colon and rectosigmoid colon. Remainder of the exam is unchanged. IMPRESSION: Nonobstructive bowel gas pattern with moderate fecal retention over the colon. Electronically Signed   By: Toribio Agreste M.D.   On: 10/13/2024 09:16   DG Abd 1 View Result Date: 10/12/2024 EXAM: 1 VIEW XRAY OF THE ABDOMEN 10/12/2024 11:01:00 AM COMPARISON: 10/11/2024 CLINICAL HISTORY: [address]1250 Ileus [facility]) [id number] Ileus (HCC) [id number] Ileus (HCC) [id number] Ileus (HCC) [address]1250 [id number] Ileus (HCC) [phone] FINDINGS: BOWEL: Interval decreased gaseous distention of the bowel. Moderate colonic stool burden. The bowel gas pattern is nonobstructive. SOFT TISSUES: Small right pleural effusion. Probable left effusion and adjacent parenchymal opacity. No abnormal calcifications. BONES: Thoracic spinal fixation hardware in place. No acute fracture. IMPRESSION: 1. Decreased gaseous distention of the bowel. 2. Moderate colonic stool burden. 3. Small right pleural effusion with probable left pleural effusion and adjacent parenchymal opacity. Electronically signed by: Dayne Hassell MD 10/12/2024 12:21 PM EST RP Workstation: [id number]   US  CHEST (PLEURAL EFFUSION) Result Date:  10/12/2024 EXAM: US  Chest Limited, Evaluate for Pleural Effusion. 10/12/2024 10:57:09 AM TECHNIQUE: Real-time ultrasound of the chest to evaluate for pleural effusion. COMPARISON: CT 10/06/2024. CLINICAL HISTORY: Pleural effusion on right. FINDINGS: LEFT PLEURAL SPACE: Small left pleural effusion. RIGHT PLEURAL SPACE: Small right pleural effusion with some adjacent atelectatic/consolidated pulmonary parenchyma. IMPRESSION: 1. Small right pleural effusion with adjacent atelectatic/consolidated pulmonary parenchyma. 2. Small left pleural effusion. Electronically signed by: Katheleen Faes MD 10/12/2024 12:19 PM EST RP Workstation: [id number]   DG Abd 1 View Result Date: 10/11/2024 EXAM: 1 VIEW XRAY OF THE ABDOMEN 10/11/2024 11:15:00 AM COMPARISON: 10/09/2024 CLINICAL HISTORY: 01250 Ileus (HCC) [id number] Ileus (HCC) [id number] Ileus (HCC) FINDINGS: BOWEL: Moderate distention of the stomach and gas-distended loops of bowel throughout the abdomen. Moderate stool burden. SOFT TISSUES: No abnormal calcifications. BONES: Thoracic fusion hardware in place. No acute fracture. IMPRESSION: 1. Gas distended loops of bowel throughout the abdomen may represent ileus. Moderate colonic stool burden. Electronically signed by: Michaeline Blanch MD 10/11/2024 11:24 AM EST RP Workstation: [id number]   DG Chest Port 1 View Result Date: 10/11/2024 EXAM: 1 VIEW XRAY OF THE CHEST 10/11/2024 11:15:00 AM COMPARISON: 10/03/2024 CLINICAL HISTORY: Ileus (HCC) FINDINGS: LINES, TUBES AND DEVICES: Left IJ CVC in place with tip at the brachiocephalic vein/SVC confluence. LUNGS AND PLEURA: Low lung volumes. Diffuse interstitial opacities, likely pulmonary edema. Bibasilar pulmonary opacities. Moderate left and small right pleural effusions. No pneumothorax. HEART AND MEDIASTINUM: Similar cardiomegaly. No acute abnormality of the mediastinal silhouette. BONES AND SOFT TISSUES: Partially imaged thoracolumbar fusion hardware. No acute osseous abnormality.  IMPRESSION: 1. Left IJ central venous catheter with tip at the confluence of the brachiocephalic veins. No pneumothorax. 2. Likely pulmonary edema. 3. Moderate left and small right pleural effusions with bibasilar opacity . Electronically signed by: Michaeline Blanch MD 10/11/2024 11:22 AM EST RP Workstation: [id number]   DG Abd 1 View Result Date: 10/09/2024 CLINICAL DATA:  Ileus EXAM: ABDOMEN - 1 VIEW COMPARISON:  Two days ago FINDINGS: No abnormal bowel dilatation is noted. Moderate amount of stool is seen in the right colon. No radio-opaque calculi or other significant radiographic abnormality are seen. IMPRESSION: Moderate stool burden. No abnormal bowel dilatation. Electronically Signed   By: Lynwood Landy Raddle M.D.   On: 10/09/2024 11:56   DG Abd 1 View Result Date: 10/07/2024 EXAM: 1 VIEW XRAY OF THE ABDOMEN 10/07/2024 07:17:00 PM COMPARISON: XR Abdomen 09/29/2024, CT Abdomen/Pelvis 09/30/2024. CLINICAL HISTORY: SBO (small bowel obstruction) (HCC). FINDINGS: BOWEL: Diffusely gas-filled, although not overtly distended, small bowel and colon throughout abdomen and pelvis. nonobstructive bowel gas pattern. SOFT TISSUES: No abnormal calcifications. BONES: Postoperative changes in lower thoracic spine. No acute fracture. LUNGS: Bilateral lung base opacity. IMPRESSION: 1. Nonobstructive bowel gas pattern. 2. Bilateral lung base opacity. Electronically signed by: Morgane Naveau MD 10/07/2024 07:24 PM EST RP Workstation: [id number]   CT Angio Chest Pulmonary Embolism (PE) W or WO Contrast Result Date: 10/06/2024 CLINICAL DATA:  Possible sepsis.  Evaluate for pulmonary embolism. EXAM: CT ANGIOGRAPHY CHEST WITH CONTRAST TECHNIQUE: Multidetector CT imaging of the chest was performed using the standard protocol during bolus administration of intravenous contrast. Multiplanar CT image reconstructions and MIPs were obtained to evaluate the vascular anatomy. RADIATION DOSE REDUCTION: This exam was performed according to  the departmental dose-optimization program which includes automated exposure control, adjustment of the mA and/or kV according to patient size and/or use of iterative reconstruction technique. CONTRAST:  OMNIPAQUE  IOHEXOL  350 MG/ML SOLN COMPARISON:  CT 06/23/2024, 05/11/2024 FINDINGS: Cardiovascular: Mild stable cardiomegaly. Mild calcified plaque over the left main and 3 vessel coronary arteries. Thoracic aorta is normal in caliber. Minimal calcified plaque over the thoracic aorta. Pulmonary arterial system is adequately opacified without definite pulmonary emboli. Remaining vascular structures are unremarkable. Mediastinum/Nodes: 1 cm right paratracheal lymph node likely reactive. No other concerning mediastinal or hilar adenopathy. Mediastinal structures are otherwise unremarkable. Lungs/Pleura: Small to moderate bilateral pleural effusions left greater than right with associated compressive atelectasis within the lung bases as these findings are slightly worse to unchanged. There is patchy hazy airspace attenuation over the mid to upper lungs with areas of mild nodularity new since the prior exam and may represent an acute infectious or inflammatory process. Obstruction of a left posterior basilar bronchus. Mild debris along the right lateral wall of the trachea. Upper Abdomen: No acute findings of the visualized upper abdomen. Musculoskeletal: Stable bilateral changes suggesting gynecomastia. Spinal stabilization hardware over the thoracic spine bridging corpectomy of 2 vertebral body levels over the lower thoracic spine. Review of the MIP images confirms the above findings. IMPRESSION: 1. No evidence of pulmonary embolism. 2. Small to moderate bilateral pleural effusions left greater than right with associated compressive atelectasis within the lung bases as these findings are slightly worse to unchanged. 3. Patchy hazy airspace attenuation over the mid to upper lungs with areas of mild nodularity new  since the prior exam and may represent an acute infectious or inflammatory process. 4. Aortic atherosclerosis. Atherosclerotic coronary artery disease. Aortic Atherosclerosis (ICD10-I70.0). Electronically Signed   By: Toribio Agreste M.D.   On: 10/06/2024 15:13   PERIPHERAL VASCULAR CATHETERIZATION Result Date: 10/05/2024 See surgical note for result.  DG Chest Port 1 View Result Date: 10/03/2024 EXAM: 1 VIEW(S) XRAY OF THE CHEST 10/03/2024 04:21:00 PM COMPARISON: 09/29/2024 CLINICAL HISTORY: Oxygen desaturation FINDINGS: LINES, TUBES AND DEVICES: Right internal jugular hemodialysis catheter removed. LUNGS AND PLEURA: Prominent vasculature slightly increased interstitial  markings . Stable left lung base consolidation and small loculated left pleural effusion. Stable right basilar atelectasis. Coarsening interstitial markings. Right costophrenic angle collimated off-view No pneumothorax. HEART AND MEDIASTINUM: Stable cardiomegaly. Unchanged cardiomediastinal silhouette. BONES AND SOFT TISSUES: Thoracolumbar fusion hardware noted. No acute osseous abnormality. IMPRESSION: 1. Mild pulmonary edema. 2. Stable left lung base consolidation and small loculated left pleural effusion. Electronically signed by: Morgane Naveau MD MD 10/03/2024 07:17 PM EST RP Workstation: [id number]   ECHOCARDIOGRAM COMPLETE Result Date: 10/02/2024    ECHOCARDIOGRAM REPORT   Patient Name:   TROY KANOUSE Date of Exam: 10/02/2024 Medical Rec #:  [id number]      Height:       70.0 in Accession #:    [id number]     Weight:       272.5 lb Date of Birth:  05/31/1961      BSA:          2.381 m Patient Age:    63 years       BP:           147/68 mmHg Patient Gender: M              HR:           79 bpm. Exam Location:  [facility] Procedure: 2D Echo, Cardiac Doppler and Color Doppler (Both Spectral and Color            Flow Doppler were utilized during procedure). Indications:     Bacteremia R78.81  History:         Patient has prior history of  Echocardiogram examinations, most                  recent 04/19/2024. Risk Factors:Dyslipidemia and Diabetes. End                  stage renal disease.  Sonographer:     Christopher Furnace Referring Phys:  [id number] DONALD BERLIN Diagnosing Phys: Caron Poser  Sonographer Comments: Technically challenging study due to limited acoustic windows, no apical window and no subcostal window. Image acquisition challenging due to patient body habitus. IMPRESSIONS  1. Very technically difficult study.  2. The mitral valve was not well visualized. No evidence of mitral valve regurgitation. No evidence of mitral stenosis.  3. The aortic valve has an indeterminant number of cusps. Aortic valve regurgitation is not visualized. No aortic stenosis is present.  4. Left ventricular ejection fraction, by estimation, is 55 to 60% based on parasternal windows only. The left ventricle probably has normal function, though very poorly visualized. Left ventricular endocardial border not optimally defined to evaluate regional wall motion. Left ventricular diastolic parameters are indeterminate.  5. Right ventricular systolic function was not well visualized. The right ventricular size is not well visualized. Comparison(s): A prior study was performed on 04/19/2024. No significant change, though unable to accurately compare due to poor image quality. FINDINGS  Left Ventricle: Left ventricular ejection fraction, by estimation, is 55 to 60%. The left ventricle has normal function. Left ventricular endocardial border not optimally defined to evaluate regional wall motion. The left ventricular internal cavity size was normal in size. Suboptimal image quality limits for assessment of left ventricular hypertrophy. Left ventricular diastolic parameters are indeterminate. Right Ventricle: The right ventricular size is not well visualized. Right vetricular wall thickness was not well visualized. Right ventricular systolic function was not well  visualized. Left Atrium: Left atrial size was not well visualized. Right Atrium: Right atrial size was not well  visualized. Pericardium: There is no evidence of pericardial effusion. Mitral Valve: The mitral valve was not well visualized. No evidence of mitral valve regurgitation. No evidence of mitral valve stenosis. Tricuspid Valve: The tricuspid valve is not well visualized. Tricuspid valve regurgitation is not demonstrated. No evidence of tricuspid stenosis. Aortic Valve: The aortic valve has an indeterminant number of cusps. Aortic valve regurgitation is not visualized. No aortic stenosis is present. Pulmonic Valve: The pulmonic valve was not well visualized. Pulmonic valve regurgitation is not visualized. No evidence of pulmonic stenosis. Aorta: The aortic root was not well visualized. IAS/Shunts: The interatrial septum was not well visualized.  LEFT VENTRICLE PLAX 2D LVIDd:         3.07 cm LVIDs:         1.93 cm LV PW:         2.62 cm LV IVS:        1.54 cm LVOT diam:     2.10 cm LVOT Area:     3.46 cm  LEFT ATRIUM         Index LA diam:    3.30 cm 1.39 cm/m   AORTA Ao Root diam: 3.70 cm  SHUNTS Systemic Diam: 2.10 cm Caron Poser Electronically signed by Caron Poser Signature Date/Time: 10/02/2024/2:07:14 PM    Final    PERIPHERAL VASCULAR CATHETERIZATION Result Date: 10/01/2024 See surgical note for result.  CT ABDOMEN PELVIS WO CONTRAST Result Date: 09/30/2024 EXAM: CT ABDOMEN AND PELVIS WITHOUT CONTRAST 09/30/2024 02:32:43 AM TECHNIQUE: CT of the abdomen and pelvis was performed without the administration of intravenous contrast. Multiplanar reformatted images are provided for review. Automated exposure control, iterative reconstruction, and/or weight-based adjustment of the mA/kV was utilized to reduce the radiation dose to as low as reasonably achievable. COMPARISON: 05/11/2024 CLINICAL HISTORY: Abdominal pain, acute, nonlocalized. FINDINGS: LOWER CHEST: Small bilateral pleural effusions.  Bilateral rounded lower lobe airspace opacities could reflect rounded atelectasis or pneumonia. LIVER: The liver is unremarkable. GALLBLADDER AND BILE DUCTS: Gallbladder is unremarkable. No biliary ductal dilatation. SPLEEN: No acute abnormality. PANCREAS: No acute abnormality. ADRENAL GLANDS: No acute abnormality. KIDNEYS, URETERS AND BLADDER: No stones in the kidneys or ureters. No hydronephrosis. No perinephric or periureteral stranding. Suprapubic Foley catheter in place with urinary bladder decompressed. GI AND BOWEL: Stomach demonstrates no acute abnormality. Rectosigmoid wall thickening concerning for proctocolitis. No bowel obstruction. Normal appendix. PERITONEUM AND RETROPERITONEUM: No ascites. No free air. VASCULATURE: Aorta is normal in caliber. Aortic atherosclerosis. LYMPH NODES: No lymphadenopathy. REPRODUCTIVE ORGANS: No acute abnormality. BONES AND SOFT TISSUES: Postoperative changes in the thoracic spine. Stable mild chronic compression fracture at T12 and L1. Umbilical hernia containing fat, stable. No focal soft tissue abnormality. IMPRESSION: 1. Rectosigmoid wall thickening concerning for proctocolitis. No bowel obstruction. Normal appendix. 2. Small bilateral pleural effusions. 3. Bilateral rounded lower lobe airspace opacities, possibly representing rounded atelectasis or pneumonia. Electronically signed by: Franky Crease MD 09/30/2024 02:45 AM EST RP Workstation: [id number]   DG Abd 2 Views Result Date: 09/29/2024 CLINICAL DATA:  Abdominal pain EXAM: DG ABDOMEN 2V COMPARISON:  05/12/2024 FINDINGS: Extensive hardware in the thoracic spine. Left greater than right pleural effusions and basilar airspace disease. Multiple moderate air-filled dilated loops of small bowel measuring up to 4.2 cm. IMPRESSION: 1. Moderate diffuse air distension of the bowel, potentially due to ileus with partial or developing bowel obstruction not excluded. Consider radiographic follow-up. 2. Left greater than right  pleural effusions and basilar airspace disease. Electronically Signed   By: Luke Scott HERO.D.  On: 09/29/2024 17:00   DG Chest Port 1 View Result Date: 09/29/2024 CLINICAL DATA:  Desaturation EXAM: PORTABLE CHEST 1 VIEW COMPARISON:  08/03/2024 FINDINGS: Right-sided central venous catheter tip at the cavoatrial region. Posterior spinal fusion hardware and interbody cage device. Cardiomegaly with vascular congestion and suspected mild interstitial edema. Left greater than right pleural effusions. IMPRESSION: Cardiomegaly with vascular congestion and suspected mild interstitial edema. Left greater than right pleural effusions. Electronically Signed   By: Luke Bun M.D.   On: 09/29/2024 15:21      Discharge Exam: Vitals:   10/29/24 0834 10/29/24 1130  BP: 139/67 135/68  Pulse: 71 68  Resp: 20 20  Temp: 97.9 F (36.6 C) 98 F (36.7 C)  SpO2: 98% 100%   Vitals:   10/29/24 0500 10/29/24 0745 10/29/24 0834 10/29/24 1130  BP:  (!) 140/73 139/67 135/68  Pulse:  70 71 68  Resp:  18 20 20   Temp:  98.6 F (37 C) 97.9 F (36.6 C) 98 F (36.7 C)  TempSrc:  Oral Oral Oral  SpO2:  97% 98% 100%  Weight: 122.7 kg       Constitutional:  Normal appearance. Non toxic-appearing.  Eyes:  Extraocular intact. Conjunctivae normal.  Cardiovascular: Rate and Rhythm: Normal rate and regular rhythm.  Pulmonary: Non labored, symmetric rise of chest wall. 8L [place] in place Skin: warm and dry. not jaundiced.  Neurological: alert. Oriented.  Psychiatric: Mood and Affect congruent.    The results of significant diagnostics from this hospitalization (including imaging, microbiology, ancillary and laboratory) are listed below for reference.     Microbiology: Recent Results (from the past 240 hours)  Blood Culture (routine x 2)     Status: None   Collection Time: 10/19/24 11:53 PM   Specimen: BLOOD  Result Value Ref Range Status   Specimen Description BLOOD RIGHT UPPER ARM  Final   Special Requests    Final    BOTTLES DRAWN AEROBIC AND ANAEROBIC Blood Culture results may not be optimal due to an inadequate volume of blood received in culture bottles   Culture   Final    NO GROWTH 5 DAYS Performed at [facility], [address] [place], [place] [address]    Report Status 10/25/2024 FINAL  Final  Resp panel by RT-PCR (RSV, Flu A&B, Covid) Anterior Nasal Swab     Status: None   Collection Time: 10/19/24 11:53 PM   Specimen: Anterior Nasal Swab  Result Value Ref Range Status   SARS Coronavirus 2 by RT PCR NEGATIVE NEGATIVE Final    Comment: (NOTE) SARS-CoV-2 target nucleic acids are NOT DETECTED.  The SARS-CoV-2 RNA is generally detectable in upper respiratory specimens during the acute phase of infection. The lowest concentration of SARS-CoV-2 viral copies this assay can detect is 138 copies/mL. A negative result does not preclude SARS-Cov-2 infection and should not be used as the sole basis for treatment or other patient management decisions. A negative result may occur with  improper specimen collection/handling, submission of specimen other than nasopharyngeal swab, presence of viral mutation(s) within the areas targeted by this assay, and inadequate number of viral copies(<138 copies/mL). A negative result must be combined with clinical observations, patient history, and epidemiological information. The expected result is Negative.  Fact Sheet for Patients:  bloggercourse.com  Fact Sheet for Healthcare Providers:  seriousbroker.it  This test is no t yet approved or cleared by the [place]  FDA and  has been authorized for detection and/or diagnosis of SARS-CoV-2  by FDA under an Emergency Use Authorization (EUA). This EUA will remain  in effect (meaning this test can be used) for the duration of the COVID-19 declaration under Section 564(b)(1) of the Act, 21 U.S.C.section 360bbb-3(b)(1), unless the  authorization is terminated  or revoked sooner.       Influenza A by PCR NEGATIVE NEGATIVE Final   Influenza B by PCR NEGATIVE NEGATIVE Final    Comment: (NOTE) The Xpert Xpress SARS-CoV-2/FLU/RSV plus assay is intended as an aid in the diagnosis of influenza from Nasopharyngeal swab specimens and should not be used as a sole basis for treatment. Nasal washings and aspirates are unacceptable for Xpert Xpress SARS-CoV-2/FLU/RSV testing.  Fact Sheet for Patients: bloggercourse.com  Fact Sheet for Healthcare Providers: seriousbroker.it  This test is not yet approved or cleared by the [place]  FDA and has been authorized for detection and/or diagnosis of SARS-CoV-2 by FDA under an Emergency Use Authorization (EUA). This EUA will remain in effect (meaning this test can be used) for the duration of the COVID-19 declaration under Section 564(b)(1) of the Act, 21 U.S.C. section 360bbb-3(b)(1), unless the authorization is terminated or revoked.     Resp Syncytial Virus by PCR NEGATIVE NEGATIVE Final    Comment: (NOTE) Fact Sheet for Patients: bloggercourse.com  Fact Sheet for Healthcare Providers: seriousbroker.it  This test is not yet approved or cleared by the [place]  FDA and has been authorized for detection and/or diagnosis of SARS-CoV-2 by FDA under an Emergency Use Authorization (EUA). This EUA will remain in effect (meaning this test can be used) for the duration of the COVID-19 declaration under Section 564(b)(1) of the Act, 21 U.S.C. section 360bbb-3(b)(1), unless the authorization is terminated or revoked.  Performed at [facility], [address] [place], [place] [address]   Blood Culture (routine x 2)     Status: None   Collection Time: 10/20/24 12:26 AM   Specimen: BLOOD  Result Value Ref Range Status   Specimen Description BLOOD LEFT UPPER  ARM  Final   Special Requests   Final    BOTTLES DRAWN AEROBIC AND ANAEROBIC Blood Culture results may not be optimal due to an inadequate volume of blood received in culture bottles   Culture   Final    NO GROWTH 5 DAYS Performed at [facility], [address] [place], [place] [address]    Report Status 10/25/2024 FINAL  Final  Urine Culture     Status: Abnormal   Collection Time: 10/20/24 12:58 AM   Specimen: Urine, Random  Result Value Ref Range Status   Specimen Description   Final    URINE, RANDOM Performed at [facility], [address], [place], [place] [address]    Special Requests   Final    NONE Reflexed from [id number] Performed at [facility], [address] [place], [place] [address]    Culture (A)  Final    >=100,000 COLONIES/mL PROTEUS MIRABILIS >=100,000 COLONIES/mL ENTEROCOCCUS FAECALIS VANCOMYCIN  RESISTANT ENTEROCOCCUS ISOLATED    Report Status 10/22/2024 FINAL  Final   Organism ID, Bacteria PROTEUS MIRABILIS (A)  Final   Organism ID, Bacteria ENTEROCOCCUS FAECALIS (A)  Final      Susceptibility   Enterococcus faecalis - MIC*    AMPICILLIN <=2 SENSITIVE Sensitive     NITROFURANTOIN <=16 SENSITIVE Sensitive     VANCOMYCIN  >=32 RESISTANT Resistant     LINEZOLID  2 SENSITIVE Sensitive     * >=100,000 COLONIES/mL ENTEROCOCCUS FAECALIS   Proteus mirabilis -  MIC*    AMPICILLIN <=2 SENSITIVE Sensitive     CEFAZOLIN  (URINE) Value in next row Sensitive      4 SENSITIVEThis is a modified FDA-approved test that has been validated and its performance characteristics determined by the reporting laboratory.  This laboratory is certified under the Clinical Laboratory Improvement Amendments CLIA as qualified to perform high complexity clinical laboratory testing.    CEFEPIME  Value in next row Sensitive      4 SENSITIVEThis is a modified FDA-approved test that has been validated and its performance characteristics determined by the reporting  laboratory.  This laboratory is certified under the Clinical Laboratory Improvement Amendments CLIA as qualified to perform high complexity clinical laboratory testing.    ERTAPENEM Value in next row Sensitive      4 SENSITIVEThis is a modified FDA-approved test that has been validated and its performance characteristics determined by the reporting laboratory.  This laboratory is certified under the Clinical Laboratory Improvement Amendments CLIA as qualified to perform high complexity clinical laboratory testing.    CEFTRIAXONE  Value in next row Sensitive      4 SENSITIVEThis is a modified FDA-approved test that has been validated and its performance characteristics determined by the reporting laboratory.  This laboratory is certified under the Clinical Laboratory Improvement Amendments CLIA as qualified to perform high complexity clinical laboratory testing.    CIPROFLOXACIN  Value in next row Resistant      4 SENSITIVEThis is a modified FDA-approved test that has been validated and its performance characteristics determined by the reporting laboratory.  This laboratory is certified under the Clinical Laboratory Improvement Amendments CLIA as qualified to perform high complexity clinical laboratory testing.    GENTAMICIN Value in next row Sensitive      4 SENSITIVEThis is a modified FDA-approved test that has been validated and its performance characteristics determined by the reporting laboratory.  This laboratory is certified under the Clinical Laboratory Improvement Amendments CLIA as qualified to perform high complexity clinical laboratory testing.    NITROFURANTOIN Value in next row Resistant      4 SENSITIVEThis is a modified FDA-approved test that has been validated and its performance characteristics determined by the reporting laboratory.  This laboratory is certified under the Clinical Laboratory Improvement Amendments CLIA as qualified to perform high complexity clinical laboratory testing.     TRIMETH /SULFA  Value in next row Sensitive      4 SENSITIVEThis is a modified FDA-approved test that has been validated and its performance characteristics determined by the reporting laboratory.  This laboratory is certified under the Clinical Laboratory Improvement Amendments CLIA as qualified to perform high complexity clinical laboratory testing.    AMPICILLIN/SULBACTAM Value in next row Sensitive      4 SENSITIVEThis is a modified FDA-approved test that has been validated and its performance characteristics determined by the reporting laboratory.  This laboratory is certified under the Clinical Laboratory Improvement Amendments CLIA as qualified to perform high complexity clinical laboratory testing.    PIP/TAZO Value in next row Sensitive      <=4 SENSITIVEThis is a modified FDA-approved test that has been validated and its performance characteristics determined by the reporting laboratory.  This laboratory is certified under the Clinical Laboratory Improvement Amendments CLIA as qualified to perform high complexity clinical laboratory testing.    MEROPENEM Value in next row Sensitive      <=4 SENSITIVEThis is a modified FDA-approved test that has been validated and its performance characteristics determined by the reporting laboratory.  This laboratory is  certified under the Clinical Laboratory Improvement Amendments CLIA as qualified to perform high complexity clinical laboratory testing.    * >=100,000 COLONIES/mL PROTEUS MIRABILIS  Respiratory (~20 pathogens) panel by PCR     Status: None   Collection Time: 10/20/24  4:09 PM   Specimen: Nasopharyngeal Swab; Respiratory  Result Value Ref Range Status   Adenovirus NOT DETECTED NOT DETECTED Final   Coronavirus 229E NOT DETECTED NOT DETECTED Final    Comment: (NOTE) The Coronavirus on the Respiratory Panel, DOES NOT test for the novel  Coronavirus (2019 nCoV)    Coronavirus HKU1 NOT DETECTED NOT DETECTED Final   Coronavirus NL63 NOT DETECTED  NOT DETECTED Final   Coronavirus OC43 NOT DETECTED NOT DETECTED Final   Metapneumovirus NOT DETECTED NOT DETECTED Final   Rhinovirus / Enterovirus NOT DETECTED NOT DETECTED Final   Influenza A NOT DETECTED NOT DETECTED Final   Influenza B NOT DETECTED NOT DETECTED Final   Parainfluenza Virus 1 NOT DETECTED NOT DETECTED Final   Parainfluenza Virus 2 NOT DETECTED NOT DETECTED Final   Parainfluenza Virus 3 NOT DETECTED NOT DETECTED Final   Parainfluenza Virus 4 NOT DETECTED NOT DETECTED Final   Respiratory Syncytial Virus NOT DETECTED NOT DETECTED Final   Bordetella pertussis NOT DETECTED NOT DETECTED Final   Bordetella Parapertussis NOT DETECTED NOT DETECTED Final   Chlamydophila pneumoniae NOT DETECTED NOT DETECTED Final   Mycoplasma pneumoniae NOT DETECTED NOT DETECTED Final    Comment: Performed at [facility], [address] [place], [place] [address]  Body fluid culture w Gram Stain     Status: None   Collection Time: 10/23/24  4:37 PM   Specimen: Lung, Left; Pleural Fluid  Result Value Ref Range Status   Specimen Description   Final    LUNG Performed at [facility], [address], [place], [place] [address]    Special Requests   Final    NONE Performed at [facility], [address] [place], [place] [address]    Gram Stain   Final    FEW WBC PRESENT, PREDOMINANTLY MONONUCLEAR NO ORGANISMS SEEN    Culture   Final    NO GROWTH 3 DAYS Performed at [facility], [address] [place], [place] [address]    Report Status 10/26/2024 FINAL  Final     Labs: BNP (last 3 results) Recent Labs    05/11/24 2240 05/21/24 2323 06/22/24 1553  BNP 188.4* 456.6* 669.6*   Basic Metabolic Panel: Recent Labs  Lab 10/23/24 0302 10/25/24 0458 10/25/24 0829 10/26/24 0742 10/26/24 1050 10/28/24 0420 10/29/24 0829  NA 128* 130*  --  131*  --  131* 130*  K 5.4* 5.3*  --  5.8*  --  6.0* 5.7*  CL 94* 93*  --  94*  --  94* 95*  CO2 26 25  --   26  --  26 22  GLUCOSE 282* 466* 424* 478* 461* 436* 386*  BUN 48* 47*  --  61*  --  63* 54*  CREATININE 4.51* 4.11*  --  4.73*  --  4.41* 4.15*  CALCIUM  7.8* 8.5*  --  8.6*  --  8.1* 8.0*  MG  --  2.0  --   --   --   --   --   PHOS  --   --   --  5.7*  --   --   --    Liver Function Tests: Recent Labs  Lab  10/26/24 0742 10/29/24 0829  AST  --  15  ALT  --  22  ALKPHOS  --  61  BILITOT  --  <0.2  PROT  --  6.1*  ALBUMIN  3.0* 2.8*   No results for input(s): LIPASE, AMYLASE in the last 168 hours. No results for input(s): AMMONIA in the last 168 hours. CBC: Recent Labs  Lab 10/23/24 0302 10/25/24 0458 10/26/24 0742 10/28/24 0420  WBC 7.5 5.4 10.5 9.2  NEUTROABS  --   --  8.3*  --   HGB 8.0* 8.5* 8.6* 8.8*  HCT 26.1* 26.7* 26.8* 28.0*  MCV 96.3 92.1 94.0 95.9  PLT 203 195 201 175   Cardiac Enzymes: No results for input(s): CKTOTAL, CKMB, CKMBINDEX, TROPONINI in the last 168 hours. BNP: Invalid input(s): POCBNP CBG: Recent Labs  Lab 10/28/24 1056 10/28/24 1740 10/28/24 2030 10/29/24 0832 10/29/24 1151  GLUCAP 328* 254* 315* 345* 372*   D-Dimer No results for input(s): DDIMER in the last 72 hours. Hgb A1c No results for input(s): HGBA1C in the last 72 hours. Lipid Profile No results for input(s): CHOL, HDL, LDLCALC, TRIG, CHOLHDL, LDLDIRECT in the last 72 hours. Thyroid function studies No results for input(s): TSH, T4TOTAL, T3FREE, THYROIDAB in the last 72 hours.  Invalid input(s): FREET3 Anemia work up No results for input(s): VITAMINB12, FOLATE, FERRITIN, TIBC, IRON , RETICCTPCT in the last 72 hours. Urinalysis    Component Value Date/Time   COLORURINE YELLOW (A) 10/20/2024 0058   APPEARANCEUR TURBID (A) 10/20/2024 0058   LABSPEC 1.024 10/20/2024 0058   PHURINE 7.0 10/20/2024 0058   GLUCOSEU NEGATIVE 10/20/2024 0058   HGBUR SMALL (A) 10/20/2024 0058   BILIRUBINUR NEGATIVE 10/20/2024 0058    KETONESUR NEGATIVE 10/20/2024 0058   PROTEINUR >=300 (A) 10/20/2024 0058   NITRITE NEGATIVE 10/20/2024 0058   LEUKOCYTESUR LARGE (A) 10/20/2024 0058   Sepsis Labs Recent Labs  Lab 10/23/24 0302 10/25/24 0458 10/26/24 0742 10/28/24 0420  WBC 7.5 5.4 10.5 9.2   Microbiology Recent Results (from the past 240 hours)  Blood Culture (routine x 2)     Status: None   Collection Time: 10/19/24 11:53 PM   Specimen: BLOOD  Result Value Ref Range Status   Specimen Description BLOOD RIGHT UPPER ARM  Final   Special Requests   Final    BOTTLES DRAWN AEROBIC AND ANAEROBIC Blood Culture results may not be optimal due to an inadequate volume of blood received in culture bottles   Culture   Final    NO GROWTH 5 DAYS Performed at [facility], [address] [place], [place] [address]    Report Status 10/25/2024 FINAL  Final  Resp panel by RT-PCR (RSV, Flu A&B, Covid) Anterior Nasal Swab     Status: None   Collection Time: 10/19/24 11:53 PM   Specimen: Anterior Nasal Swab  Result Value Ref Range Status   SARS Coronavirus 2 by RT PCR NEGATIVE NEGATIVE Final    Comment: (NOTE) SARS-CoV-2 target nucleic acids are NOT DETECTED.  The SARS-CoV-2 RNA is generally detectable in upper respiratory specimens during the acute phase of infection. The lowest concentration of SARS-CoV-2 viral copies this assay can detect is 138 copies/mL. A negative result does not preclude SARS-Cov-2 infection and should not be used as the sole basis for treatment or other patient management decisions. A negative result may occur with  improper specimen collection/handling, submission of specimen other than nasopharyngeal swab, presence of viral mutation(s) within the areas targeted by this assay, and  inadequate number of viral copies(<138 copies/mL). A negative result must be combined with clinical observations, patient history, and epidemiological information. The expected result is Negative.  Fact Sheet  for Patients:  bloggercourse.com  Fact Sheet for Healthcare Providers:  seriousbroker.it  This test is no t yet approved or cleared by the [place]  FDA and  has been authorized for detection and/or diagnosis of SARS-CoV-2 by FDA under an Emergency Use Authorization (EUA). This EUA will remain  in effect (meaning this test can be used) for the duration of the COVID-19 declaration under Section 564(b)(1) of the Act, 21 U.S.C.section 360bbb-3(b)(1), unless the authorization is terminated  or revoked sooner.       Influenza A by PCR NEGATIVE NEGATIVE Final   Influenza B by PCR NEGATIVE NEGATIVE Final    Comment: (NOTE) The Xpert Xpress SARS-CoV-2/FLU/RSV plus assay is intended as an aid in the diagnosis of influenza from Nasopharyngeal swab specimens and should not be used as a sole basis for treatment. Nasal washings and aspirates are unacceptable for Xpert Xpress SARS-CoV-2/FLU/RSV testing.  Fact Sheet for Patients: bloggercourse.com  Fact Sheet for Healthcare Providers: seriousbroker.it  This test is not yet approved or cleared by the [place]  FDA and has been authorized for detection and/or diagnosis of SARS-CoV-2 by FDA under an Emergency Use Authorization (EUA). This EUA will remain in effect (meaning this test can be used) for the duration of the COVID-19 declaration under Section 564(b)(1) of the Act, 21 U.S.C. section 360bbb-3(b)(1), unless the authorization is terminated or revoked.     Resp Syncytial Virus by PCR NEGATIVE NEGATIVE Final    Comment: (NOTE) Fact Sheet for Patients: bloggercourse.com  Fact Sheet for Healthcare Providers: seriousbroker.it  This test is not yet approved or cleared by the [place]  FDA and has been authorized for detection and/or diagnosis of SARS-CoV-2 by FDA under an  Emergency Use Authorization (EUA). This EUA will remain in effect (meaning this test can be used) for the duration of the COVID-19 declaration under Section 564(b)(1) of the Act, 21 U.S.C. section 360bbb-3(b)(1), unless the authorization is terminated or revoked.  Performed at [facility], [address] [place], [place] [address]   Blood Culture (routine x 2)     Status: None   Collection Time: 10/20/24 12:26 AM   Specimen: BLOOD  Result Value Ref Range Status   Specimen Description BLOOD LEFT UPPER ARM  Final   Special Requests   Final    BOTTLES DRAWN AEROBIC AND ANAEROBIC Blood Culture results may not be optimal due to an inadequate volume of blood received in culture bottles   Culture   Final    NO GROWTH 5 DAYS Performed at [facility], [address], [place], [place] [address]    Report Status 10/25/2024 FINAL  Final  Urine Culture     Status: Abnormal   Collection Time: 10/20/24 12:58 AM   Specimen: Urine, Random  Result Value Ref Range Status   Specimen Description   Final    URINE, RANDOM Performed at [facility], [address] [place], [place] [address]    Special Requests   Final    NONE Reflexed from [id number] Performed at [facility], [address] [place], [place] [address]    Culture (A)  Final    >=100,000 COLONIES/mL PROTEUS MIRABILIS >=100,000 COLONIES/mL ENTEROCOCCUS FAECALIS VANCOMYCIN  RESISTANT ENTEROCOCCUS ISOLATED    Report Status 10/22/2024 FINAL  Final   Organism ID, Bacteria PROTEUS MIRABILIS (A)  Final  Organism ID, Bacteria ENTEROCOCCUS FAECALIS (A)  Final      Susceptibility   Enterococcus faecalis - MIC*    AMPICILLIN <=2 SENSITIVE Sensitive     NITROFURANTOIN <=16 SENSITIVE Sensitive     VANCOMYCIN  >=32 RESISTANT Resistant     LINEZOLID  2 SENSITIVE Sensitive     * >=100,000 COLONIES/mL ENTEROCOCCUS FAECALIS   Proteus mirabilis - MIC*    AMPICILLIN <=2 SENSITIVE Sensitive     CEFAZOLIN   (URINE) Value in next row Sensitive      4 SENSITIVEThis is a modified FDA-approved test that has been validated and its performance characteristics determined by the reporting laboratory.  This laboratory is certified under the Clinical Laboratory Improvement Amendments CLIA as qualified to perform high complexity clinical laboratory testing.    CEFEPIME  Value in next row Sensitive      4 SENSITIVEThis is a modified FDA-approved test that has been validated and its performance characteristics determined by the reporting laboratory.  This laboratory is certified under the Clinical Laboratory Improvement Amendments CLIA as qualified to perform high complexity clinical laboratory testing.    ERTAPENEM Value in next row Sensitive      4 SENSITIVEThis is a modified FDA-approved test that has been validated and its performance characteristics determined by the reporting laboratory.  This laboratory is certified under the Clinical Laboratory Improvement Amendments CLIA as qualified to perform high complexity clinical laboratory testing.    CEFTRIAXONE  Value in next row Sensitive      4 SENSITIVEThis is a modified FDA-approved test that has been validated and its performance characteristics determined by the reporting laboratory.  This laboratory is certified under the Clinical Laboratory Improvement Amendments CLIA as qualified to perform high complexity clinical laboratory testing.    CIPROFLOXACIN  Value in next row Resistant      4 SENSITIVEThis is a modified FDA-approved test that has been validated and its performance characteristics determined by the reporting laboratory.  This laboratory is certified under the Clinical Laboratory Improvement Amendments CLIA as qualified to perform high complexity clinical laboratory testing.    GENTAMICIN Value in next row Sensitive      4 SENSITIVEThis is a modified FDA-approved test that has been validated and its performance characteristics determined by the reporting  laboratory.  This laboratory is certified under the Clinical Laboratory Improvement Amendments CLIA as qualified to perform high complexity clinical laboratory testing.    NITROFURANTOIN Value in next row Resistant      4 SENSITIVEThis is a modified FDA-approved test that has been validated and its performance characteristics determined by the reporting laboratory.  This laboratory is certified under the Clinical Laboratory Improvement Amendments CLIA as qualified to perform high complexity clinical laboratory testing.    TRIMETH /SULFA  Value in next row Sensitive      4 SENSITIVEThis is a modified FDA-approved test that has been validated and its performance characteristics determined by the reporting laboratory.  This laboratory is certified under the Clinical Laboratory Improvement Amendments CLIA as qualified to perform high complexity clinical laboratory testing.    AMPICILLIN/SULBACTAM Value in next row Sensitive      4 SENSITIVEThis is a modified FDA-approved test that has been validated and its performance characteristics determined by the reporting laboratory.  This laboratory is certified under the Clinical Laboratory Improvement Amendments CLIA as qualified to perform high complexity clinical laboratory testing.    PIP/TAZO Value in next row Sensitive      <=4 SENSITIVEThis is a modified FDA-approved test that has been validated and its performance  characteristics determined by the reporting laboratory.  This laboratory is certified under the Clinical Laboratory Improvement Amendments CLIA as qualified to perform high complexity clinical laboratory testing.    MEROPENEM Value in next row Sensitive      <=4 SENSITIVEThis is a modified FDA-approved test that has been validated and its performance characteristics determined by the reporting laboratory.  This laboratory is certified under the Clinical Laboratory Improvement Amendments CLIA as qualified to perform high complexity clinical laboratory  testing.    * >=100,000 COLONIES/mL PROTEUS MIRABILIS  Respiratory (~20 pathogens) panel by PCR     Status: None   Collection Time: 10/20/24  4:09 PM   Specimen: Nasopharyngeal Swab; Respiratory  Result Value Ref Range Status   Adenovirus NOT DETECTED NOT DETECTED Final   Coronavirus 229E NOT DETECTED NOT DETECTED Final    Comment: (NOTE) The Coronavirus on the Respiratory Panel, DOES NOT test for the novel  Coronavirus (2019 nCoV)    Coronavirus HKU1 NOT DETECTED NOT DETECTED Final   Coronavirus NL63 NOT DETECTED NOT DETECTED Final   Coronavirus OC43 NOT DETECTED NOT DETECTED Final   Metapneumovirus NOT DETECTED NOT DETECTED Final   Rhinovirus / Enterovirus NOT DETECTED NOT DETECTED Final   Influenza A NOT DETECTED NOT DETECTED Final   Influenza B NOT DETECTED NOT DETECTED Final   Parainfluenza Virus 1 NOT DETECTED NOT DETECTED Final   Parainfluenza Virus 2 NOT DETECTED NOT DETECTED Final   Parainfluenza Virus 3 NOT DETECTED NOT DETECTED Final   Parainfluenza Virus 4 NOT DETECTED NOT DETECTED Final   Respiratory Syncytial Virus NOT DETECTED NOT DETECTED Final   Bordetella pertussis NOT DETECTED NOT DETECTED Final   Bordetella Parapertussis NOT DETECTED NOT DETECTED Final   Chlamydophila pneumoniae NOT DETECTED NOT DETECTED Final   Mycoplasma pneumoniae NOT DETECTED NOT DETECTED Final    Comment: Performed at [facility], [address] [place], [place] [address]  Body fluid culture w Gram Stain     Status: None   Collection Time: 10/23/24  4:37 PM   Specimen: Lung, Left; Pleural Fluid  Result Value Ref Range Status   Specimen Description   Final    LUNG Performed at [facility], [address] [place], [place] [address]    Special Requests   Final    NONE Performed at [facility], [address] [place], [place] [address]    Gram Stain   Final    FEW WBC PRESENT, PREDOMINANTLY MONONUCLEAR NO ORGANISMS SEEN    Culture   Final    NO GROWTH  3 DAYS Performed at [facility], [address], [place], [place] [address]    Report Status 10/26/2024 FINAL  Final     Time coordinating discharge: 32 min   SIGNED: Chayanne Filippi, DO [facility] 10/29/2024, 12:20 PM Pager   If 7PM-7AM, please contact night-coverage     [1] No Known Allergies  "

## 2024-10-29 NOTE — Progress Notes (Signed)
 Patient refused Miralax  and lactulose . Educated patient on need. Verbalized understanding but continued to refuse. MD made aware.

## 2024-10-30 ENCOUNTER — Telehealth (INDEPENDENT_AMBULATORY_CARE_PROVIDER_SITE_OTHER): Payer: Self-pay

## 2024-10-30 NOTE — Telephone Encounter (Signed)
 Elveria from [facility] left a message stating the patient's permcath is not working as well as it should. She stated the patient was sent home from the hospital yesterday and that Dr. Dennise knew his cath was not working well. I looked in the system and per Dr. Marcelino he stated the patient's cath was working well before he was sent home. I informed Elveria that at this time I did not have a provider that would be at the hospital on 11/02/24. It was advised to have the patient be evaluated again. Elveria stated the patient said he was not going to the hospital again.

## 2024-10-30 NOTE — Progress Notes (Signed)
 D/C order noted. Contacted [facility]      to be advised of pt and d/c today. Requested documents faxed to clinic for continuation of care.  Suzen Satchel Dialysis Navigator [phone]

## 2024-11-03 ENCOUNTER — Ambulatory Visit: Admitting: Infectious Diseases
